# Patient Record
Sex: Female | Born: 1935
Health system: Southern US, Community
[De-identification: ages and names within clinical notes are randomized; demographics above are authoritative.]

## PROBLEM LIST (undated history)

## (undated) DIAGNOSIS — N189 Chronic kidney disease, unspecified: Secondary | ICD-10-CM

## (undated) DIAGNOSIS — N183 Chronic kidney disease, stage 3 (moderate): Secondary | ICD-10-CM

## (undated) DIAGNOSIS — K5790 Diverticulosis of intestine, part unspecified, without perforation or abscess without bleeding: Secondary | ICD-10-CM

## (undated) DIAGNOSIS — I4891 Unspecified atrial fibrillation: Secondary | ICD-10-CM

## (undated) DIAGNOSIS — K209 Esophagitis, unspecified without bleeding: Secondary | ICD-10-CM

## (undated) DIAGNOSIS — Z950 Presence of cardiac pacemaker: Secondary | ICD-10-CM

## (undated) DIAGNOSIS — N200 Calculus of kidney: Secondary | ICD-10-CM

## (undated) DIAGNOSIS — M797 Fibromyalgia: Secondary | ICD-10-CM

## (undated) DIAGNOSIS — K5792 Diverticulitis of intestine, part unspecified, without perforation or abscess without bleeding: Secondary | ICD-10-CM

## (undated) DIAGNOSIS — D126 Benign neoplasm of colon, unspecified: Secondary | ICD-10-CM

## (undated) DIAGNOSIS — I5043 Acute on chronic combined systolic (congestive) and diastolic (congestive) heart failure: Secondary | ICD-10-CM

## (undated) DIAGNOSIS — I428 Other cardiomyopathies: Secondary | ICD-10-CM

## (undated) DIAGNOSIS — F329 Major depressive disorder, single episode, unspecified: Secondary | ICD-10-CM

## (undated) DIAGNOSIS — M1990 Unspecified osteoarthritis, unspecified site: Secondary | ICD-10-CM

## (undated) DIAGNOSIS — Z9289 Personal history of other medical treatment: Secondary | ICD-10-CM

## (undated) DIAGNOSIS — I10 Essential (primary) hypertension: Secondary | ICD-10-CM

## (undated) DIAGNOSIS — Z7901 Long term (current) use of anticoagulants: Secondary | ICD-10-CM

## (undated) DIAGNOSIS — Z862 Personal history of diseases of the blood and blood-forming organs and certain disorders involving the immune mechanism: Secondary | ICD-10-CM

## (undated) DIAGNOSIS — K648 Other hemorrhoids: Secondary | ICD-10-CM

## (undated) DIAGNOSIS — K259 Gastric ulcer, unspecified as acute or chronic, without hemorrhage or perforation: Secondary | ICD-10-CM

## (undated) DIAGNOSIS — K2210 Ulcer of esophagus without bleeding: Secondary | ICD-10-CM

## (undated) DIAGNOSIS — K449 Diaphragmatic hernia without obstruction or gangrene: Secondary | ICD-10-CM

## (undated) DIAGNOSIS — I499 Cardiac arrhythmia, unspecified: Secondary | ICD-10-CM

## (undated) DIAGNOSIS — I82409 Acute embolism and thrombosis of unspecified deep veins of unspecified lower extremity: Secondary | ICD-10-CM

## (undated) DIAGNOSIS — M419 Scoliosis, unspecified: Secondary | ICD-10-CM

## (undated) DIAGNOSIS — F32A Depression, unspecified: Secondary | ICD-10-CM

## (undated) DIAGNOSIS — D62 Acute posthemorrhagic anemia: Secondary | ICD-10-CM

## (undated) DIAGNOSIS — H409 Unspecified glaucoma: Secondary | ICD-10-CM

## (undated) DIAGNOSIS — I700 Atherosclerosis of aorta: Secondary | ICD-10-CM

## (undated) HISTORY — DX: Cardiac arrhythmia, unspecified: I49.9

## (undated) HISTORY — DX: Other cardiomyopathies: I42.8

## (undated) HISTORY — PX: PACEMAKER INSERTION: SHX728

## (undated) HISTORY — DX: Gastric ulcer, unspecified as acute or chronic, without hemorrhage or perforation: K25.9

## (undated) HISTORY — DX: Diverticulosis of intestine, part unspecified, without perforation or abscess without bleeding: K57.90

## (undated) HISTORY — DX: Other hemorrhoids: K64.8

## (undated) HISTORY — DX: Atherosclerosis of aorta: I70.0

## (undated) HISTORY — DX: Benign neoplasm of colon, unspecified: D12.6

## (undated) HISTORY — PX: BACK SURGERY: SHX140

## (undated) HISTORY — DX: Personal history of diseases of the blood and blood-forming organs and certain disorders involving the immune mechanism: Z86.2

## (undated) HISTORY — PX: NECK SURGERY: SHX720

## (undated) HISTORY — DX: Acute embolism and thrombosis of unspecified deep veins of unspecified lower extremity: I82.409

## (undated) HISTORY — PX: HERNIA REPAIR: SHX51

## (undated) HISTORY — DX: Personal history of other medical treatment: Z92.89

## (undated) HISTORY — PX: APPENDECTOMY: SHX54

## (undated) HISTORY — DX: Acute on chronic combined systolic (congestive) and diastolic (congestive) heart failure: I50.43

## (undated) HISTORY — DX: Chronic kidney disease, unspecified: N18.9

## (undated) HISTORY — PX: TONSILLECTOMY: SUR1361

## (undated) HISTORY — PX: ABDOMINAL HYSTERECTOMY: SHX81

## (undated) HISTORY — PX: BREAST SURGERY: SHX581

## (undated) HISTORY — DX: Calculus of kidney: N20.0

## (undated) HISTORY — DX: Esophagitis, unspecified without bleeding: K20.90

## (undated) HISTORY — DX: Diaphragmatic hernia without obstruction or gangrene: K44.9

## (undated) HISTORY — PX: CHOLECYSTECTOMY: SHX55

---

## 1997-08-05 ENCOUNTER — Inpatient Hospital Stay (HOSPITAL_COMMUNITY): Admission: EM | Admit: 1997-08-05 | Discharge: 1997-08-07 | Payer: Self-pay | Admitting: Emergency Medicine

## 1997-08-14 ENCOUNTER — Emergency Department (HOSPITAL_COMMUNITY): Admission: EM | Admit: 1997-08-14 | Discharge: 1997-08-14 | Payer: Self-pay | Admitting: Emergency Medicine

## 1997-10-14 ENCOUNTER — Emergency Department (HOSPITAL_COMMUNITY): Admission: EM | Admit: 1997-10-14 | Discharge: 1997-10-14 | Payer: Self-pay | Admitting: *Deleted

## 1997-11-27 ENCOUNTER — Emergency Department (HOSPITAL_COMMUNITY): Admission: EM | Admit: 1997-11-27 | Discharge: 1997-11-27 | Payer: Self-pay | Admitting: Emergency Medicine

## 1997-11-28 ENCOUNTER — Emergency Department (HOSPITAL_COMMUNITY): Admission: EM | Admit: 1997-11-28 | Discharge: 1997-11-28 | Payer: Self-pay | Admitting: Emergency Medicine

## 1997-11-29 ENCOUNTER — Ambulatory Visit (HOSPITAL_COMMUNITY): Admission: RE | Admit: 1997-11-29 | Discharge: 1997-11-29 | Payer: Self-pay | Admitting: Emergency Medicine

## 1997-11-30 ENCOUNTER — Encounter: Admission: RE | Admit: 1997-11-30 | Discharge: 1998-02-28 | Payer: Self-pay | Admitting: Orthopedic Surgery

## 1997-12-16 ENCOUNTER — Emergency Department (HOSPITAL_COMMUNITY): Admission: EM | Admit: 1997-12-16 | Discharge: 1997-12-16 | Payer: Self-pay | Admitting: Emergency Medicine

## 1998-01-15 ENCOUNTER — Encounter: Admission: RE | Admit: 1998-01-15 | Discharge: 1998-04-15 | Payer: Self-pay | Admitting: Neurosurgery

## 1998-03-13 ENCOUNTER — Ambulatory Visit (HOSPITAL_COMMUNITY): Admission: RE | Admit: 1998-03-13 | Discharge: 1998-03-13 | Payer: Self-pay | Admitting: Neurosurgery

## 1998-03-15 ENCOUNTER — Ambulatory Visit (HOSPITAL_COMMUNITY): Admission: RE | Admit: 1998-03-15 | Discharge: 1998-03-15 | Payer: Self-pay | Admitting: Neurosurgery

## 1998-04-18 ENCOUNTER — Inpatient Hospital Stay (HOSPITAL_COMMUNITY): Admission: RE | Admit: 1998-04-18 | Discharge: 1998-04-18 | Payer: Self-pay | Admitting: Neurosurgery

## 1998-05-03 ENCOUNTER — Ambulatory Visit (HOSPITAL_COMMUNITY): Admission: RE | Admit: 1998-05-03 | Discharge: 1998-05-04 | Payer: Self-pay | Admitting: Neurosurgery

## 1998-05-09 ENCOUNTER — Ambulatory Visit (HOSPITAL_COMMUNITY): Admission: RE | Admit: 1998-05-09 | Discharge: 1998-05-09 | Payer: Self-pay | Admitting: Neurosurgery

## 1998-09-25 ENCOUNTER — Ambulatory Visit (HOSPITAL_COMMUNITY): Admission: RE | Admit: 1998-09-25 | Discharge: 1998-09-25 | Payer: Self-pay | Admitting: Neurosurgery

## 1998-10-09 ENCOUNTER — Ambulatory Visit (HOSPITAL_COMMUNITY): Admission: RE | Admit: 1998-10-09 | Discharge: 1998-10-09 | Payer: Self-pay | Admitting: Neurosurgery

## 1998-12-12 ENCOUNTER — Ambulatory Visit (HOSPITAL_BASED_OUTPATIENT_CLINIC_OR_DEPARTMENT_OTHER): Admission: RE | Admit: 1998-12-12 | Discharge: 1998-12-12 | Payer: Self-pay | Admitting: Orthopedic Surgery

## 1999-02-11 ENCOUNTER — Encounter: Admission: RE | Admit: 1999-02-11 | Discharge: 1999-05-12 | Payer: Self-pay | Admitting: Orthopedic Surgery

## 1999-03-21 ENCOUNTER — Encounter: Admission: RE | Admit: 1999-03-21 | Discharge: 1999-03-21 | Payer: Self-pay | Admitting: Neurosurgery

## 1999-10-03 ENCOUNTER — Encounter: Admission: RE | Admit: 1999-10-03 | Discharge: 1999-10-03 | Payer: Self-pay | Admitting: Orthopedic Surgery

## 1999-10-03 ENCOUNTER — Encounter: Payer: Self-pay | Admitting: Orthopedic Surgery

## 1999-10-04 ENCOUNTER — Ambulatory Visit (HOSPITAL_BASED_OUTPATIENT_CLINIC_OR_DEPARTMENT_OTHER): Admission: RE | Admit: 1999-10-04 | Discharge: 1999-10-04 | Payer: Self-pay | Admitting: Orthopedic Surgery

## 2000-02-24 ENCOUNTER — Encounter: Admission: RE | Admit: 2000-02-24 | Discharge: 2000-05-24 | Payer: Self-pay | Admitting: Anesthesiology

## 2000-06-02 ENCOUNTER — Encounter: Admission: RE | Admit: 2000-06-02 | Discharge: 2000-08-31 | Payer: Self-pay | Admitting: Anesthesiology

## 2000-09-01 ENCOUNTER — Encounter: Admission: RE | Admit: 2000-09-01 | Discharge: 2000-11-30 | Payer: Self-pay | Admitting: Anesthesiology

## 2000-12-23 ENCOUNTER — Encounter: Payer: Self-pay | Admitting: Orthopedic Surgery

## 2000-12-23 ENCOUNTER — Encounter: Admission: RE | Admit: 2000-12-23 | Discharge: 2000-12-23 | Payer: Self-pay | Admitting: Orthopedic Surgery

## 2000-12-25 ENCOUNTER — Ambulatory Visit (HOSPITAL_BASED_OUTPATIENT_CLINIC_OR_DEPARTMENT_OTHER): Admission: RE | Admit: 2000-12-25 | Discharge: 2000-12-25 | Payer: Self-pay | Admitting: Orthopedic Surgery

## 2001-12-28 ENCOUNTER — Ambulatory Visit (HOSPITAL_COMMUNITY): Admission: RE | Admit: 2001-12-28 | Discharge: 2001-12-28 | Payer: Self-pay | Admitting: Internal Medicine

## 2001-12-28 ENCOUNTER — Encounter: Payer: Self-pay | Admitting: Internal Medicine

## 2005-05-05 DIAGNOSIS — Z9289 Personal history of other medical treatment: Secondary | ICD-10-CM

## 2005-05-05 HISTORY — DX: Personal history of other medical treatment: Z92.89

## 2005-12-03 ENCOUNTER — Inpatient Hospital Stay (HOSPITAL_COMMUNITY): Admission: AD | Admit: 2005-12-03 | Discharge: 2005-12-12 | Payer: Self-pay | Admitting: Cardiology

## 2005-12-03 ENCOUNTER — Encounter: Payer: Self-pay | Admitting: Emergency Medicine

## 2005-12-04 ENCOUNTER — Encounter (INDEPENDENT_AMBULATORY_CARE_PROVIDER_SITE_OTHER): Payer: Self-pay | Admitting: Cardiology

## 2005-12-08 HISTORY — PX: CARDIAC CATHETERIZATION: SHX172

## 2005-12-16 ENCOUNTER — Inpatient Hospital Stay (HOSPITAL_COMMUNITY): Admission: AD | Admit: 2005-12-16 | Discharge: 2005-12-18 | Payer: Self-pay | Admitting: Cardiovascular Disease

## 2005-12-17 ENCOUNTER — Encounter (INDEPENDENT_AMBULATORY_CARE_PROVIDER_SITE_OTHER): Payer: Self-pay | Admitting: *Deleted

## 2005-12-31 ENCOUNTER — Ambulatory Visit: Payer: Self-pay | Admitting: Internal Medicine

## 2005-12-31 ENCOUNTER — Inpatient Hospital Stay (HOSPITAL_COMMUNITY): Admission: EM | Admit: 2005-12-31 | Discharge: 2006-01-03 | Payer: Self-pay | Admitting: *Deleted

## 2006-01-21 ENCOUNTER — Ambulatory Visit: Payer: Self-pay

## 2006-03-06 ENCOUNTER — Ambulatory Visit: Payer: Self-pay

## 2007-12-08 ENCOUNTER — Emergency Department (HOSPITAL_COMMUNITY): Admission: EM | Admit: 2007-12-08 | Discharge: 2007-12-08 | Payer: Self-pay | Admitting: Emergency Medicine

## 2008-01-02 ENCOUNTER — Inpatient Hospital Stay (HOSPITAL_COMMUNITY): Admission: EM | Admit: 2008-01-02 | Discharge: 2008-01-07 | Payer: Self-pay | Admitting: Emergency Medicine

## 2008-01-03 ENCOUNTER — Ambulatory Visit: Payer: Self-pay | Admitting: Physical Medicine & Rehabilitation

## 2008-02-11 ENCOUNTER — Ambulatory Visit (HOSPITAL_COMMUNITY): Admission: RE | Admit: 2008-02-11 | Discharge: 2008-02-11 | Payer: Self-pay | Admitting: Neurosurgery

## 2009-02-05 ENCOUNTER — Observation Stay (HOSPITAL_COMMUNITY): Admission: EM | Admit: 2009-02-05 | Discharge: 2009-02-06 | Payer: Self-pay | Admitting: Emergency Medicine

## 2009-02-13 HISTORY — PX: OTHER SURGICAL HISTORY: SHX169

## 2010-05-26 ENCOUNTER — Encounter: Payer: Self-pay | Admitting: Neurosurgery

## 2010-05-30 HISTORY — PX: DOPPLER ECHOCARDIOGRAPHY: SHX263

## 2010-08-08 LAB — LIPID PANEL
Triglycerides: 81 mg/dL (ref <150)
VLDL: 16 mg/dL (ref 0–40)

## 2010-08-08 LAB — PROTIME-INR
INR: 3.6 — ABNORMAL HIGH (ref 0.00–1.49)
Prothrombin Time: 21.5 seconds — ABNORMAL HIGH (ref 11.6–15.2)
Prothrombin Time: 35.9 seconds — ABNORMAL HIGH (ref 11.6–15.2)

## 2010-08-08 LAB — BASIC METABOLIC PANEL
BUN: 15 mg/dL (ref 6–23)
CO2: 27 mEq/L (ref 19–32)
CO2: 27 mEq/L (ref 19–32)
Calcium: 8.8 mg/dL (ref 8.4–10.5)
Chloride: 98 mEq/L (ref 96–112)
Creatinine, Ser: 1.49 mg/dL — ABNORMAL HIGH (ref 0.4–1.2)
GFR calc Af Amer: 41 mL/min — ABNORMAL LOW (ref >60)
GFR calc Af Amer: 59 mL/min — ABNORMAL LOW (ref >60)
GFR calc non Af Amer: 43 mL/min — ABNORMAL LOW (ref >60)
Glucose, Bld: 86 mg/dL (ref 70–99)
Potassium: 2.8 mEq/L — ABNORMAL LOW (ref 3.5–5.1)
Potassium: 4.3 mEq/L (ref 3.5–5.1)
Sodium: 135 mEq/L (ref 135–145)

## 2010-08-08 LAB — IRON AND TIBC
Iron: 152 ug/dL — ABNORMAL HIGH (ref 42–135)
Saturation Ratios: 54 % (ref 20–55)
UIBC: 130 ug/dL

## 2010-08-08 LAB — CBC
HCT: 33.2 % — ABNORMAL LOW (ref 36.0–46.0)
HCT: 33.2 % — ABNORMAL LOW (ref 36.0–46.0)
Hemoglobin: 11.5 g/dL — ABNORMAL LOW (ref 12.0–15.0)
MCHC: 34.6 g/dL (ref 30.0–36.0)
MCV: 110.9 fL — ABNORMAL HIGH (ref 78.0–100.0)
MCV: 111.2 fL — ABNORMAL HIGH (ref 78.0–100.0)
Platelets: 125 10*3/uL — ABNORMAL LOW (ref 150–400)
RBC: 2.99 MIL/uL — ABNORMAL LOW (ref 3.87–5.11)
RDW: 14.5 % (ref 11.5–15.5)
WBC: 4.9 10*3/uL (ref 4.0–10.5)

## 2010-08-08 LAB — POCT CARDIAC MARKERS: Myoglobin, poc: 34.4 ng/mL (ref 12–200)

## 2010-08-08 LAB — VITAMIN D 1,25 DIHYDROXY: Vitamin D 1, 25 (OH)2 Total: 40 pg/mL (ref 18–72)

## 2010-08-08 LAB — DIFFERENTIAL
Basophils Relative: 0 % (ref 0–1)
Eosinophils Absolute: 0.1 10*3/uL (ref 0.0–0.7)
Eosinophils Relative: 2 % (ref 0–5)
Lymphocytes Relative: 20 % (ref 12–46)
Neutrophils Relative %: 70 % (ref 43–77)
Smear Review: ADEQUATE

## 2010-08-08 LAB — HEMOGLOBIN A1C: Hgb A1c MFr Bld: 5.3 % (ref 4.6–6.1)

## 2010-08-25 ENCOUNTER — Inpatient Hospital Stay (HOSPITAL_COMMUNITY)
Admission: EM | Admit: 2010-08-25 | Discharge: 2010-08-29 | DRG: 690 | Disposition: A | Discharge status: Home / Self Care | Payer: Medicare Other | Attending: Internal Medicine | Admitting: Internal Medicine

## 2010-08-25 ENCOUNTER — Emergency Department (HOSPITAL_COMMUNITY): Payer: Medicare Other

## 2010-08-25 DIAGNOSIS — Z7901 Long term (current) use of anticoagulants: Secondary | ICD-10-CM

## 2010-08-25 DIAGNOSIS — Z880 Allergy status to penicillin: Secondary | ICD-10-CM

## 2010-08-25 DIAGNOSIS — I509 Heart failure, unspecified: Secondary | ICD-10-CM | POA: Diagnosis present

## 2010-08-25 DIAGNOSIS — M549 Dorsalgia, unspecified: Secondary | ICD-10-CM | POA: Diagnosis present

## 2010-08-25 DIAGNOSIS — N390 Urinary tract infection, site not specified: Principal | ICD-10-CM | POA: Diagnosis present

## 2010-08-25 DIAGNOSIS — G8929 Other chronic pain: Secondary | ICD-10-CM | POA: Diagnosis present

## 2010-08-25 DIAGNOSIS — Z882 Allergy status to sulfonamides status: Secondary | ICD-10-CM

## 2010-08-25 DIAGNOSIS — I4891 Unspecified atrial fibrillation: Secondary | ICD-10-CM | POA: Diagnosis present

## 2010-08-25 DIAGNOSIS — Z888 Allergy status to other drugs, medicaments and biological substances status: Secondary | ICD-10-CM

## 2010-08-25 DIAGNOSIS — I5022 Chronic systolic (congestive) heart failure: Secondary | ICD-10-CM | POA: Diagnosis present

## 2010-08-25 DIAGNOSIS — N179 Acute kidney failure, unspecified: Secondary | ICD-10-CM | POA: Diagnosis present

## 2010-08-25 LAB — URINALYSIS, ROUTINE W REFLEX MICROSCOPIC
Bilirubin Urine: NEGATIVE
Ketones, ur: NEGATIVE mg/dL
Nitrite: NEGATIVE
Protein, ur: NEGATIVE mg/dL

## 2010-08-25 LAB — CBC
Hemoglobin: 11.6 g/dL — ABNORMAL LOW (ref 12.0–15.0)
MCHC: 35.3 g/dL (ref 30.0–36.0)
Platelets: 245 10*3/uL (ref 150–400)
RDW: 12.2 % (ref 11.5–15.5)

## 2010-08-25 LAB — BASIC METABOLIC PANEL
BUN: 28 mg/dL — ABNORMAL HIGH (ref 6–23)
Creatinine, Ser: 1.93 mg/dL — ABNORMAL HIGH (ref 0.4–1.2)
GFR calc non Af Amer: 25 mL/min — ABNORMAL LOW (ref >60)

## 2010-08-25 LAB — DIFFERENTIAL
Basophils Absolute: 0 10*3/uL (ref 0.0–0.1)
Basophils Relative: 0 % (ref 0–1)
Eosinophils Absolute: 0.2 10*3/uL (ref 0.0–0.7)
Eosinophils Relative: 3 % (ref 0–5)
Monocytes Absolute: 0.7 10*3/uL (ref 0.1–1.0)
Neutro Abs: 4.5 10*3/uL (ref 1.7–7.7)

## 2010-08-25 LAB — PROTIME-INR
INR: 2.25 — ABNORMAL HIGH (ref 0.00–1.49)
Prothrombin Time: 25 seconds — ABNORMAL HIGH (ref 11.6–15.2)

## 2010-08-26 ENCOUNTER — Inpatient Hospital Stay (HOSPITAL_COMMUNITY): Payer: Medicare Other

## 2010-08-26 LAB — COMPREHENSIVE METABOLIC PANEL
ALT: 17 U/L (ref 0–35)
AST: 26 U/L (ref 0–37)
Albumin: 3.6 g/dL (ref 3.5–5.2)
Alkaline Phosphatase: 41 U/L (ref 39–117)
CO2: 24 mEq/L (ref 19–32)
Chloride: 106 mEq/L (ref 96–112)
Creatinine, Ser: 1.46 mg/dL — ABNORMAL HIGH (ref 0.4–1.2)
GFR calc Af Amer: 42 mL/min — ABNORMAL LOW (ref >60)
GFR calc non Af Amer: 34 mL/min — ABNORMAL LOW (ref >60)
Potassium: 3.7 mEq/L (ref 3.5–5.1)
Total Bilirubin: 0.5 mg/dL (ref 0.3–1.2)

## 2010-08-26 LAB — DIFFERENTIAL
Eosinophils Absolute: 0.2 10*3/uL (ref 0.0–0.7)
Eosinophils Relative: 3 % (ref 0–5)
Lymphocytes Relative: 21 % (ref 12–46)
Lymphs Abs: 1.2 10*3/uL (ref 0.7–4.0)
Monocytes Relative: 9 % (ref 3–12)
Neutrophils Relative %: 68 % (ref 43–77)

## 2010-08-26 LAB — CBC
HCT: 32.3 % — ABNORMAL LOW (ref 36.0–46.0)
MCH: 35.7 pg — ABNORMAL HIGH (ref 26.0–34.0)
MCV: 104.9 fL — ABNORMAL HIGH (ref 78.0–100.0)
Platelets: 226 10*3/uL (ref 150–400)
RBC: 3.08 MIL/uL — ABNORMAL LOW (ref 3.87–5.11)

## 2010-08-26 LAB — BRAIN NATRIURETIC PEPTIDE: Pro B Natriuretic peptide (BNP): 240 pg/mL — ABNORMAL HIGH (ref 0.0–100.0)

## 2010-08-26 LAB — APTT: aPTT: 43 seconds — ABNORMAL HIGH (ref 24–37)

## 2010-08-27 ENCOUNTER — Inpatient Hospital Stay (HOSPITAL_COMMUNITY): Payer: Medicare Other

## 2010-08-28 LAB — URINE CULTURE
Colony Count: >=100000
Culture  Setup Time: 201204222358

## 2010-08-28 LAB — PROTIME-INR: Prothrombin Time: 16.7 seconds — ABNORMAL HIGH (ref 11.6–15.2)

## 2010-08-29 LAB — BASIC METABOLIC PANEL
BUN: 11 mg/dL (ref 6–23)
CO2: 28 mEq/L (ref 19–32)
Glucose, Bld: 101 mg/dL — ABNORMAL HIGH (ref 70–99)
Potassium: 3.9 mEq/L (ref 3.5–5.1)
Sodium: 139 mEq/L (ref 135–145)

## 2010-08-29 LAB — CBC
Hemoglobin: 10.1 g/dL — ABNORMAL LOW (ref 12.0–15.0)
MCH: 35.9 pg — ABNORMAL HIGH (ref 26.0–34.0)
MCHC: 34.2 g/dL (ref 30.0–36.0)
MCV: 105 fL — ABNORMAL HIGH (ref 78.0–100.0)
Platelets: 215 10*3/uL (ref 150–400)
RBC: 2.81 MIL/uL — ABNORMAL LOW (ref 3.87–5.11)

## 2010-08-29 LAB — PROTIME-INR: Prothrombin Time: 17.2 seconds — ABNORMAL HIGH (ref 11.6–15.2)

## 2010-08-31 NOTE — H&P (Signed)
NAME:  Kimberly [name], Kimberly [name]                 ACCOUNT NO.:  [id number]  MEDICAL RECORD NO.:  [id number]           PATIENT TYPE:  E  LOCATION:  [facility]                         FACILITY:  [facility]  PHYSICIAN:  Farris Has, MDDATE OF BIRTH:  May 19, 1926  DATE OF ADMISSION:  08/25/2010 DATE OF DISCHARGE:                             HISTORY & PHYSICAL   PRIMARY CARE PROVIDER:  None locally.  She goes to physician in [place].  CARDIOLOGIST:  Tery Sanfilippo, MD  CHIEF COMPLAINT:  Back pain and a spot swelling on her ankles.  The patient is an 77 year old female with history of CHF with EF of 15- 20% who reports that although her weight has been somewhat down lately, she occasionally has had some swelling of her ankles and for the past 2- 3 days, she has been having back pain, she does have history of fibromyalgia, but she felt that her Vicodin was not helping her back pain, so she drove herself to emergency department.  Of note, she did not endorse any fevers or chills.  No chest pains.  She does always have some shortness of breath and was nothing above usual.  She denied orthopnea, PND.  Otherwise, review of systems is negative.  In the emergency department, the patient was found to have urinary tract infection.  She was initially given Cipro given that she has a history of penicillin allergies, but developed a rash that she was attempted to give actually Rocephin.  Still did develop rash and itching, which point the pharmacy's recommendations that going to continue with Fosfomycin.  [facility] called for an admission given her creatinine has been somewhat elevated.  PAST MEDICAL HISTORY:  Significant for: 1. Heart failure with EF of 15-20%, status post biventricular     implantable cardioverter-defibrillator, Medtronic Concerta. 2. History of chronic back pain. 3. Nonischemic cardiomyopathy. 4. Atypical chest pain. 5. Fibromyalgia. 6. History of left bundle branch-block, pacer  dependent. 7. History of moderate mitral regurgitation. 8. Chronic atrial fibrillation, on Coumadin. 9. Hypokalemia. 10.Microcytic anemia. 11.Dysmotility.  SOCIAL HISTORY:  The patient is a former smoker, but quit 5 years ago. Does not drink except occasional wine.  Does not abuse drugs.  Lives at home.  Her husband has been recently admitted to nursing home facility.  FAMILY HISTORY:  Noncontributory.  Her mother died at the very early age from a motor vehicle accident.  ALLERGIES: 1. PENICILLIN. 2. SULFA. 3. CIPRO causes a rash. 4. ROCEPHIN causes a rash.  MEDICATIONS: 1. Paroxetine 40 mg daily. 2. Coumadin she takes 2.5 mg every other day and 4 mg on other days. 3. Carvedilol 6.25 twice a day. 4. Vicodin as needed to up to four times a day. 5. Lasix 40 mg in the morning. 6. Advil PM at night. 7. Temazepam 30 mg at night.  PHYSICAL EXAMINATION:  VITAL SIGNS:  Temperature 97.8, blood pressure 155/60, pulse 79, respiration 18, satting 100% on room air. GENERAL:  The patient appears to be in no acute distress.HEENT:  Head nontraumatic.  Somewhat dryish mucous membranes and decreased skin turgor, I did not appreciate any peripheral edema whatsoever. LUNGS:  No crackles could be appreciated.  No wheezes. HEART:  Regular rate.  No murmurs could be appreciated. ABDOMEN:  Soft, nontender, slightly obese. LOWER EXTREMITIES:  No clubbing, cyanosis, or edema.  Of note, she does have multiple bruising on her pretibial area.  She also has a localized swelling on her left ankle, which she describes been there for years after she has suffered a fracture.  She has occasional needle insertion into the site and drainage perhaps a cyst.  LABORATORY DATA:  White blood cell count 6.5, hemoglobin 11.6.  Sodium 138, potassium 2.8, creatinine 1.9, her baseline creatinine is 1.2.  UA showing too numerous to count white blood cells.  EKG appears to pace. INR 2.25.  ASSESSMENT AND PLAN:  This  is an 77 year old female with urinary tract infection and worsening renal insufficiency. 1. Urinary tract infection.  The patient has had allergic reactions to     multiple medications.  At this point after discussion with     pharmacy, we will give Fosfomycin one dose. 2. Acute renal failure.  It is possible the patient has no acute     episode of fluids and she continues to take her Lasix given her     significant history of heart failure.  We will hold Lasix for one     day, give extremely gentle fluids at [facility].  Monitor her     orthostatics, urine output and weight.  I would recommend     restarting her Lasix the day after tomorrow and watching her     carefully for fluid overload. 3. History of atrial fibrillation.  She is currently paced.  Continue     carvedilol and Coumadin. 4. Prophylaxis.  Protonix and Coumadin.  CODE STATUS:  The patient wished to be do not resuscitate, do not intubate, which I discussed with her.     Farris Has, MD     AVD/MEDQ  D:  08/25/2010  T:  08/25/2010  Job:  [id number]  cc:   Tery Sanfilippo, MD  Electronically Signed by Toy Baker MD on 08/31/2010 09:44:59 PM

## 2010-09-17 NOTE — H&P (Signed)
NAME:  Kimberly [name]                 ACCOUNT NO.:  [id number]   MEDICAL RECORD NO.:  [id number]          PATIENT TYPE:  INP   LOCATION:  [id number]                         FACILITY:  [facility]   PHYSICIAN:  Jana Hakim, M.D. DATE OF BIRTH:  02-20-1927   DATE OF ADMISSION:  01/02/2008  DATE OF DISCHARGE:                              HISTORY & PHYSICAL   PRIMARY CARE PHYSICIAN:  Dr. Seward Speck in [place], [place].   CHIEF COMPLAINT:  Severe back pain.   HISTORY OF PRESENT ILLNESS:  This is a 77 year old female who was  brought by [facility] emergency medical services to [facility] secondary to complaints of severe back pain.  The patient  reports having back pain for 2 years and is currently undergoing an  evaluation by orthopedics.  She reports having increased back pain over  the past 2 weeks secondary to increased exertion and reports being  unable to walk secondary to her discomfort.  She reports that she has  had to use a walker to walk.  She also reports having increased falls  secondary to her weakness and discomfort.   PAST MEDICAL HISTORY:  Significant for chronic back pain, fibromyalgia,  osteoarthritis, coronary artery disease, nonischemic cardiomyopathy,  status post biventricular dual-chamber ICD pacer-defibrillator  placement.  The patient has an ejection fraction of 15% and class III  heart failure.   PAST SURGICAL HISTORY:  The ICD pacer-defibrillator placement, anterior  cervical diskectomy, and the patient reports having multiple other  surgeries in the past.   MEDICATIONS:  Include Coumadin, Paxil, Darvocet, lisinopril, temazepam,  Premarin, Coreg, Advil and Lasix.   ALLERGIES:  TO IODINE, PENICILLIN, SULFA AND THE PATIENT ALSO REPORTS A  MEDICATION USED PREVIOUSLY FOR A BACK INJECTION, SHE STATES IS  CHYMOPAPAIN.   SOCIAL HISTORY:  The patient is married.  Her husband has been recently  ill and hospitalized.  She is a nonsmoker.  She does  report drinking  alcohol, 1 mixed drink daily.   FAMILY HISTORY:  Noncontributory.   PHYSICAL EXAMINATION:  This is a 77 year old elderly female in no  visible discomfort or acute distress at this time.  VITAL SIGNS:  Temperature 97.1.  Blood pressure 151/71.  Heart rate 71.  Respirations 24 and O2 saturation is 98%.  HEENT:  Normocephalic, atraumatic.  Pupils are equally round, reactive  to light.  Extraocular movements are intact.  Funduscopic benign.  Oropharynx is clear.  NECK:  Supple.  Full range of motion.  No thyromegaly, adenopathy,  jugular venous distention.  CARDIOVASCULAR:  Regular paced rate.  No murmurs, gallops or rubs.  LUNGS:  Clear to auscultation bilaterally.  ABDOMEN:  Positive bowel sounds, soft, nontender, nondistended.  EXTREMITIES:  Without cyanosis, clubbing or edema.  BACK:  Examination.  No spinous process tenderness.  No palpable  muscular spasms.  Limited range of motion secondary to discomfort.   LABORATORY STUDIES:  White blood cell count 5.6, hemoglobin 11.2,  hematocrit 32.4, MCV 104.5, platelets 259, neutrophils 65%, lymphocytes  24%.  Sodium 136, potassium 3.2, chloride 99, bicarb 31, BUN 13,  creatinine 1.2 and  glucose 76.   Lumbar spine x-ray reveals stable lumbar spondylosis and scoliosis.  No  definitive acute osseous findings.   Please add cardiac enzymes the laboratory section cardiac enzymes  myoglobin 43.6, CK MB 2.8 and troponin less than 0.05.  EKG revealed a  paced rhythm, left bundle branch block present.   ASSESSMENT:  A 77 year old female being admitted with:  1. Intractable back pain.  2. Fibromyalgia.  3. Mild hypokalemia.  4. Macrocytic anemia.  5. Cardiomyopathy history.  6. Coagulopathy secondary to Coumadin therapy.   PLAN:  The patient will be admitted for pain control.  The patient also  reports having sensitivities to pain medications.  These will be  administered judiciously.  The patient will continue on her  regular  medications.  A PT and INR will be monitored daily.  Her potassium will  also be repleted.  GI prophylaxis has also been ordered.  Physical  therapy and occupational therapy have been requested and also an  evaluation by rehabilitation therapy for possible placement.      Jana Hakim, M.D.  Electronically Signed     HJ/MEDQ  D:  01/03/2008  T:  01/03/2008  Job:  [id number]

## 2010-09-17 NOTE — Discharge Summary (Signed)
NAME:  Kimberly [name], Kimberly [name]                 ACCOUNT NO.:  [id number]   MEDICAL RECORD NO.:  [id number]          PATIENT TYPE:  INP   LOCATION:  [id number]                         [facility]ACILITY:  [facility]   PHYSICIAN:  Hind I Elsaid, MD      DATE OF BIRTH:  22-Aug-1926   DATE OF ADMISSION:  01/02/2008  DATE OF DISCHARGE:  01/07/2008                               DISCHARGE SUMMARY   DISCHARGE DIAGNOSES:  1. Severe lower back pain.  2. Cardiomyopathy status post ICD.  3. Fibromyalgia.  4. History of osteoarthritis.  5. History of coronary artery disease with nonischemic cardiomyopathy,      status post biventricular ICD.  6. History of congestive heart failure which compensated during this      admission.  7. Escherichia coli urinary tract infection.   DISCHARGE MEDICATIONS:  1. Tramadol 50 mg every 6 hours as needed.  2. Skelaxin 800 mg twice daily.  3. Cipro 250 mg twice daily.  4. Prednisone tapering dose.   PROCEDURES:  1. Chest x-ray, several examination, no active cardiopulmonary      process.  2. Lumbar spine x-ray:  Lumbar spondylosis and No definitive acute      fracture_finding, however, severe end plate compression deformity      at T12 is difficult to exclude given the patient's scoliosis.  3. CT lumbar spine, T12 superior plate compression deformity.      Scoliosis deformity with advanced multiple degenerative change.   HISTORY AND HOSPITAL COURSE:  This is a 77 year old Caucasian female  admitted to the hospital with severe back pain.  The patient admitted  her back pain for two years but is currently undergoing evaluation by  orthopedics.  She had reported having increased back pain over the past  two weeks secondary to increased exertion and being unable to walk  secondary to her discomfort.  He severe back pain was felt to be  secondary to degenerative joint disease.  During her hospital stay, the  patient received PT and OT and they recommended home health versus SNF  but the  patient refused both interventions.  CT of the lumbar spine did  not show any active fracture or acute cause of her back pain and we felt  the back pain was secondary to degenerative joint disease.  Orthopedics  were consulted and as the patient was on chronic Coumadin recommendation  was for patient to contact her primary care physician within one week  for possibility of holding Coumadin to start lumbar epidural steroid  injections.  The patient also started on muscle relaxant, mainly  Skelaxin and pain medications.  Also, the patient is started on steroid  tapered dose.  During her hospital stay, the pain resolved and the  patient was able to mobilize.  The patient has an appointment to see her  cardiologist on Wednesday and the patient will contact Dr. Elisabeth Cara from  [facility] for stopping her Coumadin before  contacting her orthopedic physician.   The patient needs to follow up with her cardiologist and follow up with  Dr. Arcelia Jew for possible  epidural steroid injection.   For urinary tract infection, the patient was started on Cipro according  to the sensitivity and patient did well.   Coronary artery disease, status post ICD.  This remained stable and the  patient has no evidence of active CHF.   The patient is to resume her home medications.  We feel that the patient  is medically stable for discharge home.      Hind Franco Collet, MD  Electronically Signed     HIE/MEDQ  D:  02/02/2008  T:  02/02/2008  Job:  [id number]
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
## 2010-09-20 NOTE — Procedures (Signed)
[facility]  Patient:    Kimberly [name], Kimberly [name]                        MRN: [id number] Proc. Date: 03/03/00 Adm. Date:  [id number] Attending:  Nicholaus Bloom CC:         Alta Corning, M.D.  Cleda Mccreedy, M.D.   Procedure Report  PROCEDURE:  Lumbar sympathetic block.  DIAGNOSIS:  Complex regional pain syndrome of the left lower extremity  ANESTHESIOLOGIST:  Nicholaus Bloom, M.D.  INTERVAL HISTORY:  The patient noted marked attenuation in her pain an less intense purplish discoloration of her left foot after her block last week and initiation of the methadone at very low dose.  She seems to be tolerating it well and noticing it is helping her shoulder discomfort.  She rates her pain at 7/10 today.  PHYSICAL EXAMINATION:  VITAL SIGNS:  Blood pressure 148/82, heart rate 96, respiratory rate 18, O2 saturation 97%.  BACK:  She exhibits good healing of her back from previous injection site.  EXTREMITIES:  She continues to have coolish temperature over her left foot. There is still some purplish discoloration, but it is much less intense than at her last visit.  She is tender over the dorsum of her foot but much less so than at her last visit.  PROCEDURE:  After informed consent was obtained, the patient was placed in the prone position and IV established in her right upper extremity.  Monitors were placed.  She was sedated very lightly with fentanyl and Versed.  Using fluoroscopic guidance, I identified lumbar vertebrae L1 to L5.  She, of note, has very marked scoliosis through the lumbar spine.  At 7 cm lateral to the spinous process of L2, a mark was made on the skin.  The skin was was prepped with Betadine x 3 and draped.  I anesthetized the skin and subcutaneous structures with a total of 4 cc of 1% Lidocaine using initially a short 25-gauge needle followed by a 25-gauge spinal needle.  The 22-gauge Chiva needle was introduced in the anterolateral  aspect of L3 by AP, lateral, oblique projections.  Aspiration was negative for blood and CSF.  I injected 20 ml of 1% chloroprocaine in 1 ml increments with negative aspirates between each increment.  This was followed by 20 ml of 0.25% bupivacaine in 1 ml increments with negative aspirates between each increment.  The needle was flushed with 3 cc of air and removed intact.  Fifteen minutes later, the patient had vasal dilatation of the dorsum of the foot and some warming, although it was not hot to touch over the foot.  She had much less pain.  POSTPROCEDURE CONDITION:  Stable with evidence of a sympathetic block to the left lower extremity and decreased pain.  DISCHARGE INSTRUCTIONS: 1. Resume previous diet. 2. Limitation of activities per instruction sheet. 3. Continue on methadone 5 mg 1/2 tablet q.12h. as well as her other    medications. 4. Follow up with me in one week. DD:  03/03/00 TD:  03/03/00 Job: [id number]

## 2010-09-20 NOTE — H&P (Signed)
[facility]  Patient:    KAINAT, KNUPPEL                        MRN: [id number] Adm. Date:  [id number] Attending:  Nicholaus Bloom CC:         Cleda Mccreedy, M.D.  Alta Corning, M.D.   History and Physical  FOLLOWUP EVALUATION  HISTORY OF PRESENT ILLNESS:  Catherin comes in for follow-up evaluation of her complex regional pain syndrome of the left foot.  Since her last evaluation, the patient has had ongoing pain, and she does not feel as though the OxyContin has been of much benefit at all.  She rates her pain at 10/10.  She felt better on the Percocet.  She continues to have color changes, coldness of the foot, and tenderness.  It is more painful with weightbearing.  CURRENT MEDICATIONS:  Atenolol, Premarin, Coumadin, Paxil, and temazepam.  PHYSICAL EXAMINATION:  VITAL SIGNS:  Blood pressure 127/54, heart rate 76, respiratory rate 20, and O2 saturation 98%.  Pain level is 10/10.  EXTREMITIES:  She has reddish-blue discoloration of her left foot which is cold with tenderness to touch.  Her dorsalis pedis pulse is 1+ on that side.  IMPRESSION: 1. Complex regional pain syndrome of the left foot. 2. Other medical problems as per Dr. Eula Fried.  DISPOSITION: 1. The patient will need to go off of her Coumadin and get her PT checked.  We    plan to go ahead and go through another series of lumbar sympathetic    blocks.  She felt as though she had a very good response to those and she    would like to go through them again.  She is aware of the potential risk of    these. 2. Trial of Duragesic 25 mcg to be applied every 3 days, #5, with no refill.    We will give her a coupon. 3. Percocet 5/325 1 p.o. q.6h. p.r.n. breakthrough pain, #50 with no refill. 4. Followup with me in one to two weeks to begin her lumbar sympathetic    block series again. DD:  08/21/00 TD:  08/23/00 Job: [id number]

## 2010-09-20 NOTE — Op Note (Signed)
[place]  Patient:    Kimberly [name], Kimberly [name] Visit Number: [id number] MRN: [id number]          Service Type: DSU Location: [facility] Attending Physician:  Lowella Petties Proc. Date: 12/25/00 Adm. Date:  12/25/2000 Disc. Date: 12/25/2000                             Operative Report  PREOPERATIVE DIAGNOSIS:  Retained hardware left foot.  POSTOPERATIVE DIAGNOSIS:  Retained hardware left foot.  PROCEDURE:  Removal of hardware of left foot.  SURGEON:  Alta Corning, M.D.  ASSISTANT:  Alvina Filbert. Natividad Brood.  BRIEF HISTORY:  This is a 77 year old female with a long history of having had a bunionette procedure with a small mini-fragment screw placed in the 5th metatarsal.  The patient was brought to operating room and after appropriate anesthesia had been obtained with a general anesthetic the patient was placed supine on the operating room table.  The patient was prepped and draped in the usual sterile fashion.  Following this an incision was made over her previous incision and subcutaneous tissue in line with the screw.  The screw was identified and removed.  The following this the wound was copiously irrigated and suctioned dry.  The wound was then closed with 4-0 nylon interrupted suture.  Sterile compressive dressing was applied.  She was taken to the recovery room where she was noted to be in satisfactory condition.  ESTIMATED BLOOD LOSS:  None. Attending Physician:  Lowella Petties DD:  12/25/00 TD:  12/27/00 Job: [id number]

## 2010-09-20 NOTE — Discharge Summary (Signed)
NAME:  Kimberly [name], Kimberly [name]                 ACCOUNT NO.:  [id number]   MEDICAL RECORD NO.:  [id number]          PATIENT TYPE:  INP   LOCATION:  2016                         FACILITY:  [facility]   PHYSICIAN:  Erlene Quan, P.A.   DATE OF BIRTH:  Sep 11, 1926   DATE OF ADMISSION:  12/03/2005  DATE OF DISCHARGE:  12/12/2005                                 DISCHARGE SUMMARY   DISCHARGE DIAGNOSES:  1. Congestive heart failure, improved at discharge.  2. Presumed nonischemic cardiomyopathy with only minor to moderate      coronary artery disease with catheterization this admission.  3. Left ventricular dysfunction with an ejection fraction of 15-20% at      catheterization but 40-45% by echocardiogram.  4. Chronic atrial fibrillation with permanent pacemaker implant in the      past.  5. Chronic Coumadin therapy.   HOSPITAL COURSE:  The patient is a 77 year old female followed who has had  atrial fibrillation and pacemaker in the past.  She is on chronic Coumadin  therapy.  She is referred to Dr. Tami Ribas for evaluation of increasing  shortness of breath.  We had an appointment for December 31, 2005 but she  started having chest pain and was admitted through the emergency room December 03, 2005.  Her symptoms were initially consistent with unstable angina.  Her  INR was 3.3.  She was admitted to telemetry.  CK-MB and troponins were  obtained.  These were negative.  Her BMP was 1531.  She was treated with IV  diuretics.  Her Coumadin was held and her INR followed.  Echocardiogram done  December 04, 2005 showed EF of 40-45%.  She did have a left bundle.  Her  prothrombin was low enough for the patient t be catheterized on December 08, 2005.  She was premedicated for a history of contrast allergy prior to her  catheterization.  Catheterization revealed severely dilated left ventricular  with an EF of 15-20%.  Left main was normal.  Circumflex had 40% narrowing  at the OM and a 30 to 40% large OM.  Her LAD had a  30% narrowing in the RCA  without significant disease.  Her beta blocker was changed to Coreg.  She  was started on heparin and Coumadin post catheterization.  We continued to  push for some diaphoresis as her BMP after catheterization went up to 2129.  We feel she is stable for discharge December 12, 2005.  She will go home on  Lovenox to Coumadin crossover.  Her discharge weight is 144.  Her admission  weight was 151.   DISCHARGE MEDICATIONS:  1. Coumadin as taken prior to admission.  I believe she takes 5 mg a day      with 2.5 two days a week.  2. Lovenox 65 mg twice a day till INR is therapeutic.  3. Simvastatin 10 mg daily.  4. Lisinopril 10 mg nightly.  5. Coreg CR 20 mg daily.  6. Lasix 40 mg daily.  7. Potassium 20 mEq daily.   LABORATORY DATA:  At discharge, white count 6.1,  hemoglobin 12.4, hematocrit  36.3, platelets 296.  Sodium 135, potassium 4.0, BUN 21, creatinine 1.3.  BMP on the 10th is 426.  Chest x-ray on the 7th showed cardiomegaly with  venous hypertension and small right effusion.  INR on discharge is 1.5.  CK-  MB and troponins are negative x3.  Lipid profile showed a cholesterol of 87.  HDL 38, LDL 41.   DISPOSITION:  The patient is discharged in stable condition and will  followup Monday for prothrombin time.  She was informed to see Dr. Tami Ribas  on the 29th.      Erlene Quan, P.Timothy Lasso  D:  12/12/2005  T:  12/12/2005  Job:  [id number]   cc:   Wray Kearns, MD

## 2010-09-20 NOTE — H&P (Signed)
[facility]  Patient:    Kimberly [name], Kimberly [name]                        MRN: [id number] Adm. Date:  [id number] Attending:  Nicholaus Bloom CC:         Alta Corning, M.D.  Cleda Mccreedy, M.D. in [place]   History and Physical  FOLLOW-UP EVALUATION:  Massa comes in for follow-up evaluation of her complex regional pain syndrome of her left foot. Since her previous evaluation, the patient has noted partial improvement with the Percocet, but this is beginning to be less effective. She is asking about repeating the lumbar sympathetic blocks. She is quite aware of the potential risks of these, and she is aware that she will have to go off the Coumadin. She would like to come back in early February to have this done. She has planned to have Dr. Eula Fried check her PT in early February.  Her other medications are unchanged.  PHYSICAL EXAMINATION:  VITAL SIGNS:  Blood pressure 133/55, heart rate is 80, respiratory rate is 18, O2 saturation is 96%, pain level is 10/10.  NEUROLOGICAL:  She has bluish discoloration of her left foot and allodynia. Dorsalis pedis pulse is 1+.  CURRENT MEDICATIONS:  Atenolol, Premarin, Coumadin, Paxil, and temazepam.  IMPRESSION: 1. Complex regional pain syndrome of the left lower extremity. 2. Other medical problems per Dr. Eula Fried.  DISPOSITION: 1. Percocet 5/325 one to two p.o. q.6h. p.r.n., #240 with no refill. 2. Continue other medications with the exception of the Coumadin which she is    to stop 5 days before she has her blood work checked and then we will    proceed with a sympathetic nerve block in February if her PT is normalized. 3. I plan to see her in follow-up in early February.

## 2010-09-20 NOTE — Procedures (Signed)
[facility]  Patient:    Kimberly [name], Kimberly [name]                        MRN: [id number] Proc. Date: 02/26/00 Adm. Date:  [id number] Attending:  Nicholaus Bloom CC:         Alta Corning, M.D.   Procedure Report  PREOPERATIVE DIAGNOSIS:  Complex regional pain syndrome of the left lower extremity.  POSTOPERATIVE DIAGNOSIS:  Complex regional pain syndrome of the left lower extremity.  PROCEDURE:  Single shot lumbar sympathetic block.  SURGEON:  Nicholaus Bloom, M.D.  INTERVAL HISTORY:  The patient had her PT performed on Monday and it came back with a PT of ratio INR of one.  She is back for her sympathetic block today. She has not started the methadone.  Her pain level was unchanged.  PHYSICAL EXAMINATION:  Blood pressure 114/59, heart rate 75, respiratory rate 16, and O2 saturations 100%.  She exhibits no change with regard to her leg coolness and discomfort.  She had shiny atrophic skin.  DESCRIPTION OF PROCEDURE:  After informed consent was obtained, the patient was placed in the prone position after an IV was established in the right upper extremity.  Monitors were placed.  A pillow was placed under her abdomen.  Under fluoroscopic guidance, I identified lumbar vertebrae of L1 through L5.  She had a pretty significant scoliosis through the lumbar spine with a rotary scoliosis superimposed on this.  Seven centimeters lateral to the spinous process of L2, a mark was made on the skin.  The skin was prepped with Betadine x 3.  The skin was anesthetized with 1% lidocaine using a 25-gauge needle with 2 cc of 1% lidocaine.  An additional 2 cc of 1% lidocaine was administered using a 25-gauge spinal needle.  A 22-gauge Chiba needle was introduced at the anterolateral aspect of L3 by AP, lateral, and oblique projections.  Aspiration was negative for blood and CSF.  Twenty milliliters of 1% chloroprocaine was injected in 1 ml increments with negative aspirates.  Twenty milliliters of 0.25% bupivacaine was injected in 1 ml increments with negative aspirates between each increment.  The needle was flushed with 3 cc of air and removed intact.  She had prominent calcification of her aorta as well as degenerative changes through the spine.  She was allowed to sit up, but her blood pressure drifted down after receiving sedation for the procedure with fentanyl and Versed.  She was observed for 15 minutes in the supine position, and then sat up for another 15 minutes before allowing her to be discharged.  Her blood pressure stabilized nicely.  POSTPROCEDURE CONDITION:  The patient noted marked attenuation in her foot pain following the block.  DISPOSITION:  I advised her that I would like to bring her back next week to repeat the block, to see whether we could get some sustained improvement.  I am concerned that she is already a stage III with her complex regional pain syndrome and may not respond to the blocks.  I encouraged her to go ahead and start the methadone at a half tablet twice a day, _____ than a full tablet three times a day since she is so sensitive to the medications administered today.  I plan to see her back in follow up next week. DD:  02/26/00 TD:  02/26/00 Job: [id number]

## 2010-09-20 NOTE — Discharge Summary (Signed)
NAMEMarland Kitchen  ADESSA, CAVNESS                 ACCOUNT NO.:  [id number]   MEDICAL RECORD NO.:  [id number]          PATIENT TYPE:  INP   LOCATION:  2030                         FACILITY:  [facility]   PHYSICIAN:  Octavia Heir, MD  DATE OF BIRTH:  [birth year]   DATE OF ADMISSION:  12/31/2005  DATE OF DISCHARGE:  01/03/2006                                 DISCHARGE SUMMARY   DATE OF BIRTH:  Jul 29, 1926   DATE OF ADMISSION:  December 31, 2005   DATE OF DISCHARGE:  January 03, 2006   DISCHARGE DIAGNOSES:  1. Chest pain and shortness of breath on admission, resolved.  2. Class 3 congestive heart failure.  3. Chronic atrial fibrillation, status post atrioventricular node ablation      with subsequent permanent transvenous pacemaker implantation.  4. Nonischemic cardiomyopathy with ejection fraction of 15% to 20%.  5. Left bundle branch block, status post biventricular implantable      cardioverter defibrillator pacer upgrade by Dr. Lovena Le during this      admission.  6. Dyslipidemia.  7. Chronic obstructive pulmonary disease.  8. Coumadin anticoagulation therapy.   This is a 77 year old Caucasian female of Dr. Tami Ribas who presented to the  emergency room with complaints of chest pain, back pain, and shortness of  breath.  The patient said that she woke up with chest pain, back pain,  shortness of breath, left shoulder pain, around 4 a.m. on the day of  presentation.  During assessment in the emergency room she was pain free,  but she was recently at [facility] admitted from December 05, 2005,  through December 12, 2005, with chest pain for catheterization, and the  catheterization revealed mild coronary artery disease and severe left  ventricular dysfunction.  Her second admission during this month was from  December 16, 2005, through December 18, 2005, when the patient returned to the  hospital with complaints of right groin swelling and pain and it turned out  she had a hematoma, but  did not require any evacuation.   The patient was admitted.  Her D-dimer was positive and she underwent CAT  scan to rule out pulmonary embolism.   CT on December 31, 2005, revealed no evidence of pulmonary embolism,  cardiomegaly with marked enlargement of the right heart and evidence of  right-sided elevated heart pressure.   Dr. Rollene Fare saw the patient on January 01, 2006, and requested  electrophysiology consult.  Dr. Lovena Le saw the patient in consultation and  decided that upgrade to biventricular ICD is warranted on this patient with  low EF and left bundle branch block and symptoms of CHF.   On January 02, 2006, the patient underwent biventricular ICD pacer upgrade  and she again received a Medtronic device, tolerated the procedure well  without any immediate or delayed complications.  She underwent chest x-ray  the next morning that revealed no pneumothorax and good position of the ICD  leads.  Her device was interrogated the next morning and there was no  abnormal data documented.   HOSPITAL LABORATORIES:  CBC showed white blood  cell count 6.3, hemoglobin  10.8, hematocrit 31.3, platelet count 167.  BMET:  Sodium 133, potassium  3.3, chloride 9.9, CO2 27, glucose 105, BUN 6, creatinine 0.9, calcium 8.6.   BNP was 827, decreased to 476.  Thyroid was normal.  TSH was normal.  Cardiac panel was negative x2.   DISCHARGE MEDICATIONS:  1. Lisinopril 10 mg daily.  2. Aspirin 81 mg daily.  3. Coreg CR 20 mg daily.  4. Zocor 10 mg daily.  5. Lasix 40 mg daily.  6. Potassium chloride 20 mEq daily.  7. Paxil 20 mg daily.   DISCHARGE INSTRUCTIONS:  Supplemental discharge instructions for pacer was  given to the patient with explanation of arm movements and care of incision  site.  She is not to drive for a week and no lifting for 4 weeks greater  than 10 pounds.   DISCHARGE FOLLOWUP:  She will be seen by Dr. Lovena Le and by Dr. Tami Ribas in  their office and both appointments will  be scheduled by the office  assistants and the patient will be notified about the date and time.   DISCHARGE MEDICATIONS:  1. Antibiotics clindamycin 300 mg 1 pill t.i.d. for 5 days.  2. Lisinopril 10 mg daily.  3. Aspirin 81 mg daily.  4. Coreg CR 20 mg daily.  5. Zocor 10 mg daily.  6. Lasix 40 mg daily.  7. Potassium chloride 20 mEq daily.  8. Paxil 20 mg daily.   The patient was instructed to restart Coumadin at daily dose 2.5 mg daily.  Will recheck her INR on Tuesday.      York Grice, P.A.      Octavia Heir, MD  Electronically Signed    MK/MEDQ  D:  01/03/2006  T:  01/03/2006  Job:  [id number]   cc:   Champ Mungo. Lovena Le, MD

## 2010-09-20 NOTE — Op Note (Signed)
NAME:  [name], [name]                 ACCOUNT NO.:  [id number]   MEDICAL RECORD NO.:  [id number]           PATIENT TYPE:   LOCATION:                                 FACILITY:   PHYSICIAN:  Champ Mungo. Lovena Le, MD         DATE OF BIRTH:   DATE OF PROCEDURE:  01/02/2006  DATE OF DISCHARGE:                                 OPERATIVE REPORT   PROCEDURE PERFORMED:  Removal of a previously implanted single chamber  pacemaker; and insertion of a new dual-chamber biventricular ICD.   I INTRODUCTION:  The patient is a 77 year old woman with a longstanding  history of complete heart block and atrial fibrillation after AV node  ablation and pacemaker implantation done back in 1997.  Over the last year  she has had increasing dyspnea and shortness of breath.  When she does have  any significant walking she gets fatigued and has to stop.  She is unable to  do house work.  She underwent catheterization which demonstrated severe LV  dysfunction with an EF of 15%.  She is approaching ERI on her pacemaker.  With her severe LV dysfunction (EF of 15%, her class III heart failure, and  pacing induced left bundle branch block with a QRS duration of 180  milliseconds, left bundle), she is now referred for upgrade from her single  chamber pacemaker to a biventricular ICD.   II. PROCEDURE:  After informed consent was obtained, the patient was taken  to the diagnostic EP lab in the fasting state.  After the usual preparation  and draping, intravenous fentanyl and metaxalone was given for sedation.  Then 30 mL of lidocaine was infiltrated in the left infraclavicular region.  A 7-cm incision was carried out over this region and electrocautery utilized  to dissect down to the fascial plane.  Then 10 mL of contrast injected into  the left upper extremity venous system, demonstrating the left subclavian  vein to be patent.  It was subsequently punctured x2 and the Medtronic model  6949, 58-cm active fixation  defibrillation lead, serial number [id number]  was advanced into the right ventricle.   Mapping was carried out at the final site on the RV septum; and the R waves  measured 15 mV (paced); and with the lead actively fixed the pacing  impedance was 795 ohms.  The pacing threshold 0.5 volts at 0.5 milliseconds  and 10 volt pacing did not stimulate the diaphragm.  With the right  ventricular lead in satisfactory position, attention was then turned to  placement of the left ventricular lead.  The Medtronic MB II guiding  catheter was advanced into the right atrium along with a 6-French hexapolar  EP catheter.  The coronary sinus was cannulated without particular  difficulty.  The MB II guiding catheter was advanced into the coronary  sinus; and venography was to carried out.  This demonstrated a lateral  branch at approximately 2-to-3 o'clock on the mitral valve annulus; and a  posterior lateral branch at approximately 4 o'clock on the annulus.  The  lateral branch  appeared to be a better site for LV lead placement; and the  Medtronic model 4194, 88-cm passive fixation LV pacing lead, serial number  [id number] was advanced over the angioplasty guidewire into the lateral  vein.   At a position approximately two-thirds out from base-to-apex, the lead was  advanced; but, at this location, the pacing threshold was elevated.  The  lead was withdrawn slightly, for a distance about halfway from base-to-apex,  and in this location the pacing threshold was approximately 3 volts at 0.5  milliseconds; 10 volt pacing did not stimulate the diaphragm.  With this  satisfactory positioned, the lead was secured to subpectoralis fascia with  figure-of-eight silk suture; and the sewing sleeves were secured with silk  suture.  Kanamycin irrigation was utilized to irrigate the pocket; and  electrocautery was then utilized to dissect down to the previous permanent  pacemaker which had migrated down into the  axilla.  This was retrieved  without difficulty.  The old RV lead was subsequently capped.  The Medtronic  concerto biventricular ICD serial number [id number] was connected to the  LV and RV lead with the right atrial port been capped.  At this point, the  generator secured with silk suture.  Additional kanamycin was utilized to  irrigate the pocket.  Defibrillation threshold testing was carried out.   After the patient was more deeply sedated with fentanyl and Versed, VF was  induced with a T wave shock and a 15 joules shock was delivered which  terminated VF and restored sinus rhythm.  Then 5 minutes was allowed to  elapse and a second defibrillation threshold test carried out.  Again, VF  was induced with T wave shock and a 15 joules shock was, again, delivered  which terminated VF and restored sinus rhythm.  At this point, no additional  defibrillation threshold testing was carried out; and the incision was  closed with layer of 2-0 Vicryl, followed by a layer of 3-0 Vicryl, followed  by a layer of 4-0 Vicryl.  Benzoin was painted on the skin and Steri-Strips  were applied and a pressure dressing was placed; and the patient was  returned to her room in satisfactory condition.   III. COMPLICATIONS:  There were no immediate procedure complications.   IV. RESULTS:  Successful removal of a previously implanted Medtronic single  chamber pacemaker; and insertion of a new biventricular ICD in a patient  with class III heart failure of over 9 months duration, severe LV  dysfunction with an EF of 15%, and pacing induced left bundle branch block  with a QRS duration of 180 milliseconds; all in the setting of complete  heart block and chronic atrial fibrillation.           ______________________________  Champ Mungo. Lovena Le, MD     GWT/MEDQ  D:  01/02/2006  T:  01/03/2006  Job:  [id number]   cc:   Kimberly Kearns, MD  Kimberly [name]

## 2010-09-20 NOTE — Discharge Summary (Signed)
NAMEMarland Kitchen  CELISSA, TERNES                 ACCOUNT NO.:  [id number]   MEDICAL RECORD NO.:  [id number]          PATIENT TYPE:  INP   LOCATION:  6532                         FACILITY:  [facility] [facility]   PHYSICIAN:  Jeanella Craze. Little, M.D. DATE OF BIRTH:  07/16/1926   DATE OF ADMISSION:  12/16/2005  DATE OF DISCHARGE:  12/18/2005                                 DISCHARGE SUMMARY   Ms. Kimberly [name] is a 77 year old white female, who came to the office with  complaints about a groin knot.  She underwent cardiac cath on December 08, 2005, by Dr. Rex Kras.  She had nonobstructive CAD with EF of 15-20%.  She has  chronic atrial fib and she was on Coumadin and she was put on heparin to  Coumadin and then Lovenox to Coumadin during her hospitalization.  She went  home on Lovenox.  Her last INR check had been December 15, 2005, and her INR  supposedly was 1.8.  She was seen in the office by myself and Dr. Rex Kras.  Her groin was very tender.  It was decided to admit her.  She was also  complaining about back pain and we decided to get a CT scan to rule out  retroperitoneal bleed.  The CT scan showed no retroperitoneal bleed.  She  had a duplex the following morning and she had no pseudoaneurysm.  Her  Coumadin was held.  On December 18, 2005, it was decided she could just be  discharged home.  We will hold Coumadin for 1 week.  She will been back in  the office next Tuesday.  At which time, her Coumadin can be restarted.   DISCHARGE MEDICATIONS:  1. Coumadin 2.5 mg every day.  2. Simvastatin 10 mg at bedtime.  3. Lisinopril 10 mg every day.  4. Coreg 3.125 mg twice per day.  5. Lasix 40 mg every day.  6. KCL 20 mEq every day.  The KCL is on hold.  7. She should hold her aspirin for now.  8. Darvocet p.r.n. for pain.   LABS:  Sodium 127, potassium 5.6, glucose 152, BUN 21, creatinine 1.2.  December 17, 2005, her potassium was 5.1.  December 16, 2005, her INR was 1.6.  Her hemoglobin 12.1, hematocrit 35.1, WBC 6.4, and MCV  107.   DISCHARGE DIAGNOSES:  1. Right groin hematoma post cath on anticoagulation, Lovenox to Coumadin.  2. Nonobstructive coronary artery disease.  3. Nonischemic cardiomyopathy with an ejection fraction of 15-20%.  4. Chronic atrial fibrillation.  5. __________ deep vein thrombosis.  6. History of AV nodal ablation.  She is pessary dependent.  7. Moderate MR.      Cyndia Bent, N.P.    ______________________________  Jeanella Craze. Little, M.D.    BB/MEDQ  D:  12/18/2005  T:  12/18/2005  Job:  [id number]   cc:   Verlon Setting, M.D.

## 2010-09-20 NOTE — Op Note (Signed)
[facility]  Patient:    Kimberly [name], Kimberly [name]                        MRN: [id number] Proc. Date: 09/02/00 Adm. Date:  [id number] Attending:  Nicholaus Bloom CC:         Alta Corning, M.D.  Dr. Meridee Score   Operative Report  PROCEDURE:  Lumbar sympathetic block.  DIAGNOSIS:  Left foot pain with complex regional pain syndrome of the left lower extremity.  ANESTHESIOLOGIST:  Nicholaus Bloom, M.D.  INTERVAL HISTORY:   I switched her over to Duragesic, and she has noted no benefit.  She would like to go back on the Percocet which she noted was much more helpful and cheaper.  She continues to have a lot of pain in her left foot with associated coldness and color changes.  CURRENT MEDICATIONS:  Atenolol, Premarin, Paxil, and temazepam.  She has been off the ______ since last Tuesday.  PHYSICAL EXAMINATION:  EXTREMITIES:  The patient demonstrates bluish discoloration of her left foot.  VITAL SIGNS:  She is afebrile with vital signs stable.  Please see flow sheet for details.  DESCRIPTION OF PROCEDURE:  After informed consent was obtained, the patient was taken to the fluoroscopy suite where she was monitored and an IV established in her left upper extremity.  She was placed in the prone position with a pillow under her abdomen.  Using fluoroscopic guidance, I identified lumbar vertebrae L1 through L5, and I marked the spinous processes.  At 7 cm lateral to the spinous process of L2, a mark was made on the skin.  The skin was prepped with Betadine x 3 and draped.  Using a 25-gauge short needle, I injected lidocaine into the skin and subcutaneous structures to anesthetize this area.  A 25-gauge spinal needle was used to anesthetize deeper structures.  A 22-gauge Chiba needle was introduced at the anterolateral aspect of L3 by AP, lateral, and oblique projections.  The patient approximated very closely to the vertebrae.  Aspiration was negative for blood and  CSF.  I injected 15 ml of 1% chloroprocaine in 1 ml increments.  The aspirations were negative between each increment.  The foot began to pink up. I went ahead and injected 20 ml of 0.25% bupivacaine in 1 ml increments with negative aspirates between each increment.  The needle was flushed with 3 cc of air and removed intact.  POSTPROCEDURE CONDITION:  The patient noted that her foot was pinking up, and she had increased warmth on the medial aspect of the foot.  Her pain was somewhat subsided.  DISCHARGE INSTRUCTIONS: 1. Resume previous diet. 2. The patient presented today with her husband as her driver.  Limitation    per instructions and instruction sheet as outlined by my assistant    today. 3. Discontinue Duragesic and continue with the Percocet 5/325 one p.o. q.6h.    p.r.n., #100 with no refills. 4. Follow up with me in one week for repeat lumbar sympathetic block. DD:  09/02/00 TD:  09/02/00 Job: [id number]

## 2010-09-20 NOTE — Procedures (Signed)
[facility]  Patient:    Kimberly [name], Kimberly [name]                        MRN: [id number] Proc. Date: 09/09/00 Adm. Date:  [id number] Attending:  Nicholaus Bloom CC:         Alta Corning, M.D.  Cline Cools, M.D.   Procedure Report  PROCEDURE:  Lumbar sympathetic block.  DIAGNOSIS:  Left foot pain on the basis of complex regional pain syndrome.  INTERVAL HISTORY:  The patient has not noted a great deal of improvement after her last injection.  Nevertheless, she wishes to try it today.  Her medications are unchanged and include atenolol, Premarin, temazepam, and Percocet.  She is not taking her Coumadin currently.  PHYSICAL EXAMINATION:  The patient is afebrile with vital signs stable.  She has coolish discoloration of her left foot and decreased temperature.  DESCRIPTION OF PROCEDURE:  After informed consent was obtained, the patient was placed in the prone position in the fluoroscopy suite and monitored.  An IV was not established.  Using fluoroscope guidance, I identified the spinous process of L2 and 7 cm lateral to this, a mark was made on the skin.  The skin was prepped with  Betadine x 3.  I draped out the skin.  Using a 25 gauge short needle, I anesthetized the skin and subcutaneous tissues with 2 cc of 1% lidocaine.  A 25 gauge spinal needle was then used to anesthetize deeper structures with an additional 2 mL of 1% lidocaine.  A 22 gauge Chiba needle was introduced to the anterior lateral aspect of L3 by AP, lateral and oblique projections.  Aspiration was negative for blood and CSF.  I injected 15 mL of 1% chloroprocaine in 1 mL increments with negative aspirates for each increment.  Subsequently, 20 mL of 0.25% bupivacaine was injected in 1 mL increments with negative aspirates between each increment.  The needle was flushed with 3 cc of air and removed intact.  Postprocedure condition - stable with mild decrease in pain.  DISCHARGE  INSTRUCTIONS: 1. Resume previous diet. 2. Limitations on activities per instruction sheet, as outlined by my    assistant today.  Her husband was her driver. 3. Continue with the Percocet as previously prescribed. 4. Follow up with me in eight weeks.  She will need to let us know when she    needs a prescription for her Percocet. DD:  09/09/00 TD:  09/09/00 Job: [id number]

## 2010-09-20 NOTE — H&P (Signed)
[facility]  Patient:    Kimberly [name], Kimberly [name]                        MRN: [id number] Adm. Date:  [id number] Attending:  Nicholaus Bloom CC:         Dr. Otelia Sergeant   History and Physical  FOLLOW-UP EVALUATION  [name] comes in for follow-up evaluation of her complex regional pain syndrome of the left lower extremity.  Since her last evaluation she noted that the last injection lasted about four days and she had an excellent response, but has more or less returned in severity.  She notes when she takes 4-6 tablets of Percocet per day, she has pretty significant control of her pain.  She is currently rating it at 10/10.  She notes that it is less blue than it has been.  It is made worse by walking and improved by her medication.  She describes no new symptoms related to her left foot.  CURRENT MEDICATIONS: 1. Atenolol 25 mg per day. 2. Premarin. 3. Coumadin 3.5 mg per day. 4. Paxil 20 mg per day. 5. Temazepam at night. 6. Percocet 5/325 1-2 p.o. q.6h. p.r.n.  PHYSICAL EXAMINATION:  VITAL SIGNS:  Blood pressure 129/72, heart rate 77, respiratory rate 20, O2 saturations 98%, pain level is 10/10.  EXTREMITIES:  She has bluish discoloration of the left foot with coolness relative to the right with intact dorsalis pedis pulses at 1-2+ and symmetric. She has mild allodynia.  IMPRESSION: 1. Complex regional pain syndrome of the left foot. 2. Other medical problems per Dr. Charlyne Mom.  DISPOSITION: 1. I discussed switching the patient over to a long acting opiate.  She is    intolerant of methadone which caused itching.  We will go ahead and try her    on OxyContin 10 mg one p.o. q.8h., #90 with no refill.  She was made aware    of the side effects of this. 2. We discussed extensively the possibility of repeating the blocks, but she    has not had what I would appreciate as a sustained optimal response to    this, and I advised that I wished to hold off for  the time being.  I    advised her that I would like to see how she does for a month with the    OxyContin.  I plan to see her back at the end of that time to reassess. DD:  07/21/00 TD:  07/21/00 Job: [id number]

## 2010-09-20 NOTE — Assessment & Plan Note (Signed)
[facility] OFFICE NOTE   NAME:[name], Kimberly DERKS                        MRN:          [id number]  DATE:01/21/2006                            DOB:          [birth year]    Kimberly [name] is seen in the device clinic today, September 19th, for postop  followup of her Medtronic Concerto single-chamber biventricular  defibrillator, upgraded August 31st for ischemic cardiomyopathy.  She has  also had an AV node ablation in 1997.   Upon exam, the site looks good without swelling or redness.  Steri-Strips  were removed without incident.   Upon interrogation, battery voltage is 3.20 volts with the last recorded  charge time of 8.6 seconds.  Upon testing, she is dependent to a rate of 30  in the ventricle.  In the right ventricle, impedence is 560 ohm with a  threshold of 0.5 volts at 0.4 ms.  In the left ventricle, impedence is 632  ohm with a threshold of 3 volts at 0.3 ms.   No programming changes were made at this time.  Her left LV lead is  programmed to 3 volts at 0.8 ms, and she does complain of diaphragmatic  pacing when lying on her left side; however, no changes were made.  She will  be followed by Dr. Tami Ribas at [facility], and we will  see her on a p.r.n. basis.                                   Manus Rudd, RN                                Deboraha Sprang, MD, [facility]   CF/MedQ  DD:  01/21/2006  DT:  01/22/2006  Job #:  [id number]

## 2010-09-20 NOTE — Procedures (Signed)
[facility]  Patient:    Kimberly [name], Kimberly [name]                        MRN: [id number] Proc. Date: 06/19/00 Adm. Date:  [id number] Attending:  Nicholaus Bloom CC:         Alta Corning, M.D.  A.S. Eula Fried, M.D., [place], [name]   Procedure Report  PROCEDURE:  Left lumbar sympathetic block.  DIAGNOSIS:  Complex regional pain syndrome of the left lower extremity.  ANESTHESIOLOGIST:  Nicholaus Bloom, M.D.  INTERVAL HISTORY:  The patient was seen on June 03, 2000, at which time we went ahead and discussed a lumbar sympathetic block.  She has been off of her Coumadin, and her PT normalized through the course of the week.  She required two PTs before it came on down.  She notes that there is no change in the degree of her pain.  PHYSICAL EXAMINATION:  Blood pressure 118/84, heart rate 77, respiratory rate 82, O2 saturation 100%.  She demonstrates a blue, ice cold foot which is tender to touch.  DESCRIPTION OF PROCEDURE:  After informed consent was obtained, the patient was taken to the fluoroscopy suite where she was placed in the prone position with the pillow under her abdomen.  An IV was established in the left upper extremity, and a monitor was placed.  She was sedated with Versed.  I identified lumbar vertebrae L1-L5 and marked the spinous processes at each one of these.  Because of her scoliosis we had to angulate the fluoroscopic beam to accommodate this.  Then 7 cm lateral to the adjusted beam identification of the spinous process of L2.  A mark was made on the skin. The skin was prepped with Betadine x 3.  A skin wheal was raised with 2 cc of 1% lidocaine after draping the skin.  A 22 gauge Chiba needle was introduced into the anterior lateral aspect of L3 by AP, lateral and oblique projections. Aspiration was negative for blood.  Then 25 ml of 1% chloroprocaine was injected in 1 ml increments.  The patient noticed some attenuation of her pain.  Then 20  ml of 0.25% bupivacaine was injected in 1 ml increments with negative aspirates between each increment.  The needle was flushed with air and removed intact.  Post procedure condition:  The patient noted that her pain markedly decreased and she had ___________ though her foot remained fairly cold.  DISPOSITION: 1. Resume previous diet. 2. Limitation of activities per instruction sheet. 3. Continue on Percocet. 4. Follow up with me next week for repeat injection. 5. She is to remain off of her Coumadin. DD:  06/19/00 TD:  06/20/00 Job: [id number]
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
## 2010-09-20 NOTE — H&P (Signed)
[facility]  Patient:    Kimberly [name], Kimberly [name]                        MRN: [id number] Adm. Date:  [id number] Attending:  Nicholaus Bloom CC:         Alta Corning, M.D.  Cleda Mccreedy, M.D., [place], [name]   History and Physical  FOLLOW-UP EVALUATION  HISTORY OF PRESENT ILLNESS:  Zahlia comes in for follow-up evaluation of her complex regional syndrome of her left lower extremity.  Her foot has turned blue again and is cold despite our blocks.  She is having more problems with itching from her methadone and did not get the Claritin filled because it was too expensive.  She continues on the Premarin and atenolol, is back on the Coumadin, and is taking Paxil with temazepam.  We discussed going back to the Percocet since it seemed to work in the past.  She is not interested in further blocks since they did not result in sustained improvements.  PHYSICAL EXAMINATION:  GENERAL APPEARANCE:  VITAL SIGNS:  Blood pressure is 126/57, heart rate is 87, respiratory rate is 18, O2 saturation is 97%.  Pain level is back up to 10/10.  EXTREMITIES:  She has bluish discoloration of left foot with cold foot.  She has good pulse.  It is tender to palpation and has some mild edema.  IMPRESSION: 1. Complex regional pain syndrome of the left lower extremity, likely at    stage 2. 2. Depression per Dr. Eula Fried. 3. Arrhythmia per Dr. Eula Fried. 4. Tension headaches.  DISPOSITION: 1. Discontinue methadone. 2. Discontinue Claritin. 3. Percocet 5/325 one p.o. q.6h. p.r.n. #120 with no refill. 4. Continue other medications. 5. The patients blood pressure is not adequate enough to consider a trial of    Catapres patches, so will not go down that road.  She might benefit from    using Zostrix, and I will discuss this with her at her next visit. 6. Follow up with me in four weeks. DD:  04/02/00 TD:  04/02/00 Job: [id number]

## 2010-09-20 NOTE — Procedures (Signed)
[facility]  Patient:    Kimberly [name], Kimberly [name]                        MRN: [id number] Proc. Date: 03/12/00 Adm. Date:  [id number] Attending:  Nicholaus Bloom CC:         Alta Corning, M.D.  Cleda Mccreedy, M.D.   Procedure Report  PROCEDURE:  Lumbar sympathetic block.  DIAGNOSIS:  Complex regional pain syndrome of the left lower extremity.  INTERVAL HISTORY:  The patients noted that her pain has come up somewhat since her last visit. She has developed a lot of itching which she attributes to the methadone 1/2 tablet twice a day. She comes in for a third injection today. I advised her that I wish to see how she did with this but if she did not have sustained improvement, we would probably hold off on any further injections. For her itching, we discussed antihistamines and apparently with Benadryl she has had difficulties with feeling hyper if she took this so I discussed just using Claritin 10 mg which she has never had before.  PHYSICAL EXAMINATION:  Blood pressure 114/64, heart rate 76, respiratory rate 17, O2 saturations 100%, pain level 7/10. Her left foot is cold and tender to touch.  DESCRIPTION OF PROCEDURE:  After informed consent was obtained, the patient was placed in the prone position with a pillow under her abdomen. Monitors were placed and an IV established in the right upper extremity. She was sedated with fentanyl and Versed. I identified lumbar vertebra L1 through L5 and marked the spinous process of L2. I optimized visualization of the vertebra and the beam was adjusted to her scoliosis. A skin mark was made 7 cm lateral to the spinous process of L2. The area was prepped with Betadine x 3 and draped. I anesthetized the skin with 2 cc of 1% lidocaine and deeper structures using a 25 gauge spinal needle with an additional 2 ml of 1% lidocaine. A 22 gauge Chiba needle was introduced to the anterolateral aspect of L3 by AP, lateral and oblique  projections. Aspiration was negative for blood. Then 20 ml of 1% chloroprocaine was injected in 1 ml increments. Then 20 ml of 0.25% bupivacaine was injected in 1 ml increments. The needle was flushed with air and removed intact.  CONDITION POST PROCEDURE:  Stable with some moderation in pain and pinking up of her foot.  DISPOSITION: 1. Resume previous diet. 2. Limitations in activities per instruction sheet as outlined by my    assistant today. 3. Continue on the methadone at 2.5 mg b.i.d. but supplement with Claritin    10 mg 1 p.o. q.d. #30 with 2 refills. She was advised of the potential    side effects of this. 4. Followup with me in 2 weeks for a follow-up evaluation and not a block. I    want to determine whether the blocks are resulting in any sustained    improvement. DD:  03/12/00 TD:  03/12/00 Job: [id number]

## 2010-09-20 NOTE — Consult Note (Signed)
NAME:  Kimberly [name], Kimberly [name]                 ACCOUNT NO.:  [id number]   MEDICAL RECORD NO.:  [id number]          PATIENT TYPE:  INP   LOCATION:  2030                         FACILITY:  [facility]   PHYSICIAN:  Champ Mungo. Lovena Le, MD    DATE OF BIRTH:  10/04/1926   DATE OF CONSULTATION:  01/01/2006  DATE OF DISCHARGE:                                   CONSULTATION   CONSULTATION REQUESTED BY:  Dr. Terance Ice   INDICATION FOR CONSULTATION:  Evaluation of congestive heart failure in a  patient with complete heart block status post AV node ablation and chronic  atrial fibrillation.   HISTORY OF PRESENT ILLNESS:  The patient is a 77 year old woman who was  admitted to hospital with worsening heart failure symptoms and pain in the  right leg.  She has a history of chronic atrial fibrillation and is status  post pacemaker insertion and AV node ablation back in 1997.  She has a  history of increasing shortness of breath for the last several months and  was admitted to hospital back earlier this month with shortness of breath  and worsening LV dysfunction by echocardiogram (EF 15-20%).  The patient  underwent catheterization demonstrating calcification of her native  coronaries but no tight stenoses.  Several days post catheterization she  developed swelling and pain in her right groin and was subsequently found to  have a large hematoma.  This continued to increase in size and in  conjunction with her shortness of breath resulted in presentation to the  hospital.  Her additional past medical history is notable for neck surgery  in the past.  She has a history of dyslipidemia.  Her medications include  Coumadin, Zocor, lisinopril, Coreg, aspirin, Lasix, and potassium.   FAMILY HISTORY:  Is noncontributory.   SOCIAL HISTORY:  The patient is married.  She continues to smoke cigarettes.  She has a history of allergies to PENICILLIN, IODINE and SULFA.   REVIEW OF SYSTEMS:  Is notable for right groin  swelling.  Otherwise, all  systems reviewed and found to negative except as noted in the HPI.   PHYSICAL EXAMINATION:  GENERAL:  She is a pleasant 77 year old woman who  looks somewhat younger than her stated age.  VITAL SIGNS:  The blood pressure was 130/70, the pulse was 76 and regular,  the respirations were 18, temperature was 98.  HEENT:  Normocephalic and atraumatic.  Pupils equal and round.  The  oropharynx was moist.  The sclerae were anicteric.  NECK:  Revealed no jugular venous distension.  There is no thyromegaly.  Trachea was midline.  The carotids are 2+ and symmetric.  LUNGS:  Clear  bilaterally to auscultation.  There is no increased work of breathing.  There are no wheezes, rales or rhonchi.  CARDIOVASCULAR:  Revealed a regular rate and rhythm with normal S1 and S2.  There were no murmurs, rubs or gallops appreciated.  The PMI was enlarged  and laterally displaced.  ABDOMEN:  Soft, nontender, nondistended.  There was no organomegaly.  The  bowel sounds are present and there is  no rebound or guarding.  EXTREMITIES:  Demonstrated no cyanosis, clubbing or edema.  The pulses were  2+ and symmetric.  NEUROLOGIC:  Alert and oriented x3 with cranial nerves intact.  The strength  is 5/5 and symmetric.   The EKG demonstrates atrial fibrillation with underlying ventricular pacing.  Her potassium was 3.8, creatinine was 0.9.  Hemoglobin 12.  INR was 1.1  today.   IMPRESSION:  1. Nonischemic cardiomyopathy.  2. Congestive heart failure.  3. Pacing-induced left bundle branch block.  4. Complete heart block.  5. Chronic atrial fibrillation.   DISCUSSION:  I have discussed the treatment options with the patient and her  husband detail.  The risks, benefits, goals, and expectations of upgrade to  a biventricular defibrillator from a single-chamber pacemaker have been  discussed with the patient and she wishes to proceed.  This will be  scheduled the next day or two.            ______________________________  Champ Mungo. Lovena Le, MD     GWT/MEDQ  D:  01/01/2006  T:  01/02/2006  Job:  [id number]   cc:   Wiliam Ke

## 2010-09-20 NOTE — Cardiovascular Report (Signed)
NAME:  Kimberly [name], Kimberly [name]                 ACCOUNT NO.:  [id number]   MEDICAL RECORD NO.:  [id number]          PATIENT TYPE:  INP   LOCATION:  2016                         FACILITY:  [facility]   PHYSICIAN:  Jeanella Craze. Little, M.D. DATE OF BIRTH:  12/20/1926   DATE OF PROCEDURE:  12/08/2005  DATE OF DISCHARGE:                              CARDIAC CATHETERIZATION   INDICATIONS FOR TEST:  This 77 year old female has atrial fibrillation and a  permanent pacemaker.  She was admitted with several weeks of increasing  shortness of breath and chest heaviness.  She ruled out for myocardial  infarction. Her Coumadin has been discontinued, and she had been maintained  on heparin and is brought to the catheterization lab for cardiac  catheterization.   After obtaining informed consent, the patient was prepped and draped in the  usual sterile fashion exposing the right groin.  Applying anesthetic with 1%  Xylocaine, the Seldinger technique employed, and a 5-French introducer  sheath was placed into the right femoral artery.  Left and right coronary  arteriography and ventriculography in the RAO projection was performed.  A  total of three ventriculograms were performed to make sure we could  calculate an ejection fraction and make sure there was no significant ectopy  that would inferior with this.   COMPLICATIONS:  None.   EQUIPMENT:  5-French Judkins configuration catheters.   TOTAL CONTRAST USED:  105 mL   RESULTS:  1.  Hemodynamic monitoring: The central aortic pressure was 130/70.  Left      ventricular pressure was 130/14.  There was no aortic valve gradient at      the time of pullback.  2.  Ventriculography.  Ventriculography in the RAO projection using 20 mL of      contrast revealed the left ventricle to be severely dilated.  There was      severe global dysfunction with an ejection fraction around 15-20%.  The      left ventricular end-diastolic pressure was 19.  There was +1 mitral  regurgitation.  There was a ventriculogram performed with the pigtail      catheter right underneath the aortic valve, and, when it was injected,,      the pigtail catheter was pulled back into the aorta, and aortic root      revealed no significant aortic insufficiency and an appropriate diameter      aortic root.   CORONARY ARTERIOGRAPHY:  There was dense calcification in the proximal  portion of the left main, the proximal third of the LAD, and also at the  bifurcation of the LAD and the diagonal.  1.  Left main normal. It bifurcated.  2.  LAD.  The LAD crossed the apex of the heart.  In the mid portion of the      LAD at the bifurcation of the diagonal was an area of 30% narrowing in      the LAD itself.  There were minimal irregularities in the proximal      portion where the more dense calcification was seen.  The diagonal  itself was a moderate-size vessel without significant irregularities.  3.  Circumflex.  The circumflex gave rise to a large OM vessel and      bifurcated.  At the bifurcation of the circumflex was an area of 30-40%      narrowing.  The ongoing circumflex right at the bifurcation had an area      of 40% narrowing.  4.  Right coronary artery. The right coronary had no significant disease.      The PDA and small posterolateral branch were seen, and they were free of      disease also.   CONCLUSION:  1.  Dense calcification of the left anterior descending and left main with      no high-grade stenosis.  Mild disease in the circumflex and left      anterior descending system.  2.  Severe left ventricular dysfunction with dilatation of the left      ventricle.  3.  Ejection fraction approximately 15 to 20%.  4.  Left ventricular end-diastolic pressure of 90.  5.  +1 mitral regurgitation.   I have the Tenormin and placed her on Coreg 20 mg.  I also placed her on  lisinopril 10 mg.  This is all because of her LV dysfunction.   She will be restarted on her  heparin in about 3 hours after the  catheterization, and we will start her back on p.o. Coumadin tonight.  I  cannot explain this significant decrease in LV function, but it is  relatively clear that this is the explanation for her dyspnea.           ______________________________  Jeanella Craze. Little, M.D.     ABL/MEDQ  D:  12/08/2005  T:  12/08/2005  Job:  [id number]   cc:   Octavia Heir, MD  Dr. Okey Regal, [facility]  Catheterization Lab

## 2010-09-30 NOTE — Discharge Summary (Signed)
NAME:  Kimberly [name], Kimberly [name]                 ACCOUNT NO.:  [id number]  MEDICAL RECORD NO.:  [id number]           PATIENT TYPE:  I  LOCATION:  [id number]                         FACILITY:  [facility]  PHYSICIAN:  Marlowe Cinquemani I Vernal Hritz, MD      DATE OF BIRTH:  January 24, 1927  DATE OF ADMISSION:  08/25/2010 DATE OF DISCHARGE:  08/29/2010                              DISCHARGE SUMMARY   DISCHARGE DIAGNOSES: 1. Enterobacter cloacae urinary tract infection, sensitive to Cipro. 2. Acute renal failure resolved. 3. History of atrial fibrillation and chronic anticoagulation. 4. History of chronic back and neck pain secondary to severe scoliosis     and chronic T12 compression fracture and severe lumbar spondylosis. 5. Microcytic anemia, hemoglobin around 10.1 and hematocrit 29.5.  CONSULTATION: 1. Orthopedics consulted for the patient's back pain. 2. Dr. Marcial Pacas consulted.  MEDICATIONS: 1. Cipro 250 mg p.o. b.i.d. for 3 days. 2. Coreg 6.25 mg twice daily. 3. Furosemide 40 mg daily. 4. Hydrocodone/APAP 5/500 q.4 h. as needed. 5. Protonix 40 mg p.o. daily. 6. Temazepam 30 mg daily day. 7. Warfarin 2.5 mg alternate with 4 mg every other day.  The patient needs to check her Coumadin level.  After discharge, we will provide home health RN and PT.  PROCEDURES: 1. X-ray of the lumbar spine, lumbar spondylosis and scoliosis.  No     other findings. 2. CT of C-spine.  Fusion at C5-7.  Mild recurrent foraminal     enlargement potentially affecting the C5 nerve and C4-C5 has a     degeneration with right greater than left foraminal enlargement     secondary to uncovertebral joint and facet disease. 3. CT of lumbar spine.  Severe dextroconvex scoliosis with apex at L1,     chronic T12 compression fracture.  Severe multilevel lumbar     spondylosis.  HISTORY OF PRESENT ILLNESS:  This is an 77 year old female with history of CHF, EF 15-20%.  She presented with back pain.  She does have a history of fibromyalgia.  No chest  pain.  On presentation, she found to have white blood cells 6.5, hemoglobin 11.6, sodium 138, potassium 2.8, creatinine 1.9, and urinalysis too numerous to count white blood cells and EKG appears to base.  INR was 2.25. 1. Urinary tract infection.  The patient treated and the patient found     to have Enterobacter cloacae UTI and treated with Cipro for another     3 days. 2. Acute renal failure resolved after holding Lasix. 3. Her congestive heart failure remain stable, and the patient started     on the at Lasix 40 mg p.o. daily secondary to the history of CHF     and low EF. 4. Hypokalemia corrected. 5. AFib.  Restarted on Coreg and rate remained under control.     Coumadin was started, but her INR dropped down to 1.38.  despite     Coumadin could to be related to Cipro.  The patient resumed her     dose of Coumadin per pharmacy and home nurse will come to check the     patient's Coumadin level.  Plan to keep Coumadin between 2 and 3. 6. Back pain.  Orthopedics consulted, and the patient's back pain     secondary to severe degenerative disease currently secondary to the     patient's comorbidities and EF of 15 also declined any further     surgical intervention, and we agreed with the current plan of only     pain control. 7. CHF status post ICD.  This has remained stable, and needs to follow     up with her primary care physician as well as Dr. Elisabeth [name], has     appointment time.  Currently, we feel the patient is stable for     discharge.     Kimberly Thackeray Franco Collet, MD     HIE/MEDQ  D:  09/26/2010  T:  09/26/2010  Job:  [id number]  cc:   Tery Sanfilippo, MD Dimas Alexandria, MD  Electronically Signed by Donia Ast MD on 09/30/2010 04:32:01 PM

## 2010-10-18 ENCOUNTER — Other Ambulatory Visit: Payer: Self-pay | Admitting: Cardiovascular Disease

## 2010-10-18 ENCOUNTER — Ambulatory Visit
Admission: RE | Admit: 2010-10-18 | Discharge: 2010-10-18 | Disposition: A | Discharge status: Home / Self Care | Payer: Medicare Other | Source: Ambulatory Visit | Attending: Cardiovascular Disease | Admitting: Cardiovascular Disease

## 2010-10-18 DIAGNOSIS — R0602 Shortness of breath: Secondary | ICD-10-CM

## 2010-10-22 ENCOUNTER — Ambulatory Visit (HOSPITAL_COMMUNITY)
Admission: RE | Admit: 2010-10-22 | Discharge: 2010-10-23 | Disposition: A | Discharge status: Home / Self Care | Payer: Medicare Other | Source: Ambulatory Visit | Attending: Cardiovascular Disease | Admitting: Cardiovascular Disease

## 2010-10-22 DIAGNOSIS — G8929 Other chronic pain: Secondary | ICD-10-CM | POA: Insufficient documentation

## 2010-10-22 DIAGNOSIS — Z79899 Other long term (current) drug therapy: Secondary | ICD-10-CM | POA: Insufficient documentation

## 2010-10-22 DIAGNOSIS — Z01812 Encounter for preprocedural laboratory examination: Secondary | ICD-10-CM | POA: Insufficient documentation

## 2010-10-22 DIAGNOSIS — M549 Dorsalgia, unspecified: Secondary | ICD-10-CM | POA: Insufficient documentation

## 2010-10-22 DIAGNOSIS — Z7901 Long term (current) use of anticoagulants: Secondary | ICD-10-CM | POA: Insufficient documentation

## 2010-10-22 DIAGNOSIS — I428 Other cardiomyopathies: Secondary | ICD-10-CM | POA: Insufficient documentation

## 2010-10-22 DIAGNOSIS — Z4502 Encounter for adjustment and management of automatic implantable cardiac defibrillator: Secondary | ICD-10-CM | POA: Insufficient documentation

## 2010-10-22 LAB — PROTIME-INR
INR: 1.03 (ref 0.00–1.49)
Prothrombin Time: 13.7 seconds (ref 11.6–15.2)

## 2010-10-22 LAB — SURGICAL PCR SCREEN: Staphylococcus aureus: NEGATIVE

## 2010-10-23 ENCOUNTER — Ambulatory Visit (HOSPITAL_COMMUNITY): Payer: Medicare Other

## 2010-10-23 LAB — PROTIME-INR
INR: 0.98 (ref 0.00–1.49)
Prothrombin Time: 13.2 seconds (ref 11.6–15.2)

## 2010-10-29 NOTE — Discharge Summary (Signed)
NAME:  Kimberly [name], GALANT                 ACCOUNT NO.:  [id number]  MEDICAL RECORD NO.:  [id number]  LOCATION:  6527                         FACILITY:  [facility]  PHYSICIAN:  Quay Burow, M.D.   DATE OF BIRTH:  August 16, 1926  DATE OF ADMISSION:  10/22/2010 DATE OF DISCHARGE:  10/23/2010                              DISCHARGE SUMMARY   DISCHARGE DIAGNOSES: 1. Status post biventricular automatic implantable cardioverter-     defibrillator change out to biventricular pacemaker. 2. The explanted device was a Medtronic Concerto and implanted device     was a Medtronic [id number].  HOSPITAL COURSE:  Ms. Tegtmeier is an 77 year old female with a history of severe nonischemic cardiomyopathy, status post implantation of a dual- chamber BiV defibrillator by Dr. Crissie Sickles in 2007.  This device has reached elective replacement interval.  She has never had defibrillator discharges and most recent evaluation shows her left ventricular systolic function has dramatically improved and her ejection fraction is now greater than 55%.  On October 22, 2010, she underwent change out to [id number] Medtronic Concerto device and this was implanted without complications.  The patient has been seen by Dr. Gwenlyn Found who feels she is stable for discharge home to follow up with Dr. Sallyanne Kuster in 1 week.  DISCHARGE LABORATORY DATA:  None.  STUDIES/PROCEDURES:  October 22, 2010: 1. BiV AICD generator change out.  Replaced with a biventricular     pacemaker. 2. Medtronic Concerto 0000000 was explanted and the new device was a     Medtronic [id number], serial number [id number].  This device was     implanted without complications.  DISCHARGE MEDICATIONS: 1. Acetaminophen 325 mg one to two tablets by mouth every 4 hours as     needed for pain. 2. Ibuprofen 200 mg 1 tablet by mouth daily as needed. 3. Nitroglycerin sublingual 0.4 mg 1 tablet under the tongue every 5     minutes up to three doses total for chest pain. 4. Potassium chloride 20 mEq  2 tablets by mouth daily. 5. Carvedilol 6.25 mg 1 tablet by mouth twice daily. 6. Furosemide 40 mg 1 tablet by mouth every morning. 7. Hydrocodone/APAP 5/500 mg 1 tablet by mouth every 6 hours as needed     for pain. 8. Paroxetine 40 mg 1 tablet by mouth daily. 9. Temazepam 30 mg 1 tablet by mouth daily at bedtime. 10.Warfarin 3 mg 1 tablet by mouth daily.  DISPOSITION:  Ms. Broussard will be discharged home in a stable condition. It was recommended she increase her activity slowly.  She may bathe but she needs to keep the incision site dry for 1 week.  No lifting for 1 week and nothing heavy for 6 weeks thereafter and no driving for 1 week. I would recommend she stays in the sling for one more day and then no lifting the arm above the shoulder for 1 week.  If the pacer site becomes red, painful, swollen, or discharges fluid or pus, she is to call our office immediately.  She will follow up with Dr. Sallyanne Kuster in approximately 1 week and our office call with the appointment time.    ______________________________ Tarri Fuller, PA   ______________________________  Quay Burow, M.D.    BH/MEDQ  D:  10/23/2010  T:  10/24/2010  Job:  [id number]  cc:   Sanda Klein, MD  Electronically Signed by Tarri Fuller PA on 10/24/2010 03:20:26 PM Electronically Signed by Quay Burow M.D. on 10/29/2010 09:23:52 AM

## 2010-10-31 NOTE — Op Note (Signed)
NAMEMarland [name]  Kimberly, [name]                 ACCOUNT NO.:  [id number]  MEDICAL RECORD NO.:  [id number]  LOCATION:  6527                         FACILITY:  [facility]  PHYSICIAN:  Sanda Klein, MD     DATE OF BIRTH:  [birth year]  DATE OF PROCEDURE:  10/22/2010 DATE OF DISCHARGE:                              OPERATIVE REPORT   PROCEDURES PERFORMED: 1. Removal of old biventricular pacemaker/defibrillator generator. 2. Insertion of a new biventricular pacemaker (CRTP device). 3. Fluoroscopy. 4. Moderate sedation. 5. Testing of chronic left ventricular and chronic right ventricular     pace sense leads, attached to the pacemaker generator. 6. Capping of chronic defibrillator lead (Medtronic Sprint Fidelis     [phone] "Recall" lead).  PREOPERATIVE DIAGNOSES: 1. Remote history of severe nonischemic cardiomyopathy due to     uncontrolled atrial fibrillation with rapid ventricular response     and right ventricular apical pacing; greatly improved left     ventricular systolic function now in the normal range following     cardiac resynchronization therapy. 2. Medtronic Sprint Fidelis [phone] lead under "Recall." 3. History of atrial fibrillation with rapid ventricular response     status post atrioventricular node ablation (the patient is     pacemaker dependent). 4. No recent episodes of congestive heart failure.  Ms. Pomar is an 77 year old woman that had longstanding problems with atrial fibrillation that appeared to have lead to tachycardia related cardiomyopathy further worsened by right ventricular apical pacing following AV node ablation.  She had a remarkable improvement in the left ventricle systolic function following AV node ablation and institution of CRT therapy.  Since her left ventricle ejection fraction had been severely depressed, she has received a CRT-D device. Unfortunately, her defibrillator lead is a potentially malfunctioning Sprint Fidelis lead with a higher rate of spontaneous  fracture.  The patient's defibrillator generator has reached elective replacement interval.  We had a lengthy discussion regarding options of therapy. One option would be to implant a new right ventricular defibrillator lead and a new CRTP device.  However, since the patient's systolic function has improved so dramatically and since she has never had sustained or nonsustained ventricular tachyarrhythmias, the decision was reached after discussion with the patient to replace her defibrillator generator with a CRTP (biventricular pacemaker) device.  After risks and benefits of the procedure described, the patient provided informed consent.  She was brought to the cardiac cath lab in a fasting state.  Then the left prepectoral area was prepped and draped in usual sterile fashion.  Local anesthesia with 1% lidocaine was administered to the area of the defibrillator implantation.  A 6-cm horizontal incision was made at the level of the generator.  The decision was consciously made to avoid performing an incision at the site of previous complicated device implantation and pocket revision (history of pocket hematoma and potential superficial infection).  The skin in this area had considerable fat atrophy and potentially reduced healing properties.  Very limited electrocautery was used slowly at the level of the skin. Blunt and sharp dissection was used to expose the old defibrillator pocket.  The skin was remarkably thin and the fatty tissue very friable. The pocket  was opened and then the defibrillator generator was removed from the pocket.  The leads were left connected since the patient was pacemaker dependent.  The old right ventricular pace sense lead that had been used with her initial single chamber pacemaker was identified at the bottom of the pocket and carefully dissected away from the scar tissue.  The lead cap was removed and the lead was tested and sensing and pacing  parameters were found to be excellent.  It was therefore decided that this chronic right ventricular lead will be used together with the patient's left ventricular lead for CRTP therapy.  The pocket was flushed with copious amounts of antibiotic solution and carefully inspected for hemostasis which was found to be excellent.  The old right ventricular pace sense lead was attached to the new CRTP generator and appropriate ventricular pacing was seen.  The chronic left ventricular lead was disconnected from the defibrillator generator and connected to the new CRTP device.  The chronic defibrillator leads were disconnected from the old generator and the pace sense port as well as the defibrillation coil tendons were capped.  The caps were secured in place with 2-0 silk.  Due to the perceived increased risk of infection, the decision was made to use AIGIS antibiotic impregnated pouch.  The new CRTP generator was carefully placed in the appropriately prepared pouch and then the entire system was carefully placed in the old pocket with great care was taken that the pouch assume a comfortable position without pressure on the edges of the pocket or the wound.  Care was taken that the capped defibrillator pins were located deep to the generator in the pouch.  The pocket was then closed with a 2-0 Vicryl and then a layer of cutaneous staples.  Sterile dressing was applied.  COMPLICATIONS:  None.  ESTIMATED BLOOD LOSS:  Less than 5 mL.  DEVICE DETAILS:  Existing leads: 1. The chronic right ventricular pace sense lead implanted on November     24, __________ is a Medtronic 4024-58 cm serial [id number]. 2. The left ventricular lead is a Medtronic 4194-88 cm, serial     [id number] inserted on January 02, 2006. Both these leads were used with the new device. 1. The chronic right ventricular defibrillator lead is a Medtronic     Sprint Fidelis [id number] cm lead serial [id number] inserted on      January 02, 2006.  This lead was capped, but left in place. 2. The new generator is a Medtronic [id number] CRTP device, serial     [id number]. The explanted biventricular defibrillator was a Medtronic Concerta model [id number] serial [id number] implanted on January 02, 2006.  Final measurements of the chronic right ventricular lead (4024-58 cm) at the end of the procedure showed the following:  Sensed R-waves 8.0 mV (LV paced; the patient is pacemaker dependent) impedance 904 ohms, threshold 0.3 volts at 0.5 milliseconds pulse width.  The output on this was programed at 2.5 volts at 0.4 milliseconds pulse width.  The chronic left ventricular lead threshold was found to be best in the LV tip to can configuration at 1.25 volts at 1.0 milliseconds pulse width.  The previous configuration had been bipolar 2.5 volts at 0.8 milliseconds pulse width.  The decision was made to change program to the LV tip to can configuration with a 1 volt safety margin.  The device was programed VVIR with a lower rate of 70 beats per minute and a upper rate of 120 beats per minute.  Of  note, Ms. Heeke warfarin has been interrupted primarily to allow lumbar spine injection to be performed by Dr. Marcial Pacas.  This was scheduled to be done tomorrow at 3 p.m.  Warfarin should be resumed immediately afterwards.     Sanda Klein, MD     MC/MEDQ  D:  10/22/2010  T:  10/23/2010  Job:  [id number]  cc:   [facility] Dimas Alexandria, MD  Electronically Signed by Sanda Klein M.D. on 10/31/2010 04:27:06 PM

## 2010-11-08 HISTORY — PX: OTHER SURGICAL HISTORY: SHX169

## 2010-11-28 ENCOUNTER — Emergency Department (HOSPITAL_COMMUNITY): Payer: Medicare Other

## 2010-11-28 ENCOUNTER — Emergency Department (HOSPITAL_COMMUNITY)
Admission: EM | Admit: 2010-11-28 | Discharge: 2010-11-28 | Disposition: A | Discharge status: Home / Self Care | Payer: Medicare Other | Attending: Emergency Medicine | Admitting: Emergency Medicine

## 2010-11-28 DIAGNOSIS — R0789 Other chest pain: Secondary | ICD-10-CM | POA: Insufficient documentation

## 2010-11-28 DIAGNOSIS — I509 Heart failure, unspecified: Secondary | ICD-10-CM | POA: Insufficient documentation

## 2010-11-28 DIAGNOSIS — I2510 Atherosclerotic heart disease of native coronary artery without angina pectoris: Secondary | ICD-10-CM | POA: Insufficient documentation

## 2010-11-28 DIAGNOSIS — Z950 Presence of cardiac pacemaker: Secondary | ICD-10-CM | POA: Insufficient documentation

## 2010-11-28 DIAGNOSIS — I10 Essential (primary) hypertension: Secondary | ICD-10-CM | POA: Insufficient documentation

## 2010-11-28 LAB — CK TOTAL AND CKMB (NOT AT ARMC): Total CK: 100 U/L (ref 7–177)

## 2010-11-28 LAB — COMPREHENSIVE METABOLIC PANEL
AST: 27 U/L (ref 0–37)
BUN: 17 mg/dL (ref 6–23)
CO2: 27 mEq/L (ref 19–32)
Chloride: 100 mEq/L (ref 96–112)
Creatinine, Ser: 1.21 mg/dL — ABNORMAL HIGH (ref 0.50–1.10)
GFR calc Af Amer: 52 mL/min — ABNORMAL LOW (ref >60)
GFR calc non Af Amer: 43 mL/min — ABNORMAL LOW (ref >60)
Glucose, Bld: 93 mg/dL (ref 70–99)
Total Bilirubin: 0.3 mg/dL (ref 0.3–1.2)

## 2010-11-28 LAB — URINALYSIS, ROUTINE W REFLEX MICROSCOPIC
Hgb urine dipstick: NEGATIVE
Nitrite: NEGATIVE
Protein, ur: NEGATIVE mg/dL
Urobilinogen, UA: 0.2 mg/dL (ref 0.0–1.0)

## 2010-11-28 LAB — DIFFERENTIAL
Basophils Absolute: 0 10*3/uL (ref 0.0–0.1)
Basophils Relative: 0 % (ref 0–1)
Eosinophils Absolute: 0.2 10*3/uL (ref 0.0–0.7)
Eosinophils Relative: 3 % (ref 0–5)
Monocytes Absolute: 0.8 10*3/uL (ref 0.1–1.0)

## 2010-11-28 LAB — CBC
MCHC: 33.4 g/dL (ref 30.0–36.0)
Platelets: 256 10*3/uL (ref 150–400)
RDW: 13.7 % (ref 11.5–15.5)
WBC: 6.4 10*3/uL (ref 4.0–10.5)

## 2010-11-28 LAB — PROTIME-INR
INR: 1.65 — ABNORMAL HIGH (ref 0.00–1.49)
Prothrombin Time: 19.8 seconds — ABNORMAL HIGH (ref 11.6–15.2)

## 2010-11-28 LAB — TROPONIN I: Troponin I: <0.3 ng/mL (ref <0.30)

## 2010-11-28 LAB — URINE MICROSCOPIC-ADD ON

## 2011-01-22 ENCOUNTER — Encounter: Payer: Self-pay | Admitting: *Deleted

## 2011-01-22 ENCOUNTER — Other Ambulatory Visit: Payer: Self-pay

## 2011-01-22 ENCOUNTER — Emergency Department (HOSPITAL_COMMUNITY)
Admission: EM | Admit: 2011-01-22 | Discharge: 2011-01-23 | Disposition: A | Discharge status: Other Acute Inpatient Hospital | Payer: Medicare Other | Attending: Emergency Medicine | Admitting: Emergency Medicine

## 2011-01-22 ENCOUNTER — Emergency Department (HOSPITAL_COMMUNITY): Payer: Medicare Other

## 2011-01-22 DIAGNOSIS — M4120 Other idiopathic scoliosis, site unspecified: Secondary | ICD-10-CM | POA: Insufficient documentation

## 2011-01-22 DIAGNOSIS — Z950 Presence of cardiac pacemaker: Secondary | ICD-10-CM | POA: Insufficient documentation

## 2011-01-22 DIAGNOSIS — T43502A Poisoning by unspecified antipsychotics and neuroleptics, intentional self-harm, initial encounter: Secondary | ICD-10-CM | POA: Insufficient documentation

## 2011-01-22 DIAGNOSIS — F329 Major depressive disorder, single episode, unspecified: Secondary | ICD-10-CM

## 2011-01-22 DIAGNOSIS — T424X4A Poisoning by benzodiazepines, undetermined, initial encounter: Secondary | ICD-10-CM | POA: Insufficient documentation

## 2011-01-22 DIAGNOSIS — F3289 Other specified depressive episodes: Secondary | ICD-10-CM | POA: Insufficient documentation

## 2011-01-22 DIAGNOSIS — I4891 Unspecified atrial fibrillation: Secondary | ICD-10-CM | POA: Insufficient documentation

## 2011-01-22 DIAGNOSIS — Z9079 Acquired absence of other genital organ(s): Secondary | ICD-10-CM | POA: Insufficient documentation

## 2011-01-22 HISTORY — DX: Major depressive disorder, single episode, unspecified: F32.9

## 2011-01-22 HISTORY — DX: Depression, unspecified: F32.A

## 2011-01-22 HISTORY — DX: Unspecified osteoarthritis, unspecified site: M19.90

## 2011-01-22 HISTORY — DX: Unspecified atrial fibrillation: I48.91

## 2011-01-22 HISTORY — DX: Scoliosis, unspecified: M41.9

## 2011-01-22 HISTORY — DX: Presence of cardiac pacemaker: Z95.0

## 2011-01-22 LAB — URINE MICROSCOPIC-ADD ON

## 2011-01-22 LAB — RAPID URINE DRUG SCREEN, HOSP PERFORMED
Barbiturates: NOT DETECTED
Cocaine: NOT DETECTED
Opiates: NOT DETECTED
Tetrahydrocannabinol: NOT DETECTED

## 2011-01-22 LAB — DIFFERENTIAL
Basophils Relative: 0 % (ref 0–1)
Eosinophils Absolute: 0.1 10*3/uL (ref 0.0–0.7)
Eosinophils Relative: 1 % (ref 0–5)
Lymphs Abs: 0.9 10*3/uL (ref 0.7–4.0)
Neutrophils Relative %: 78 % — ABNORMAL HIGH (ref 43–77)

## 2011-01-22 LAB — URINALYSIS, ROUTINE W REFLEX MICROSCOPIC
Glucose, UA: NEGATIVE mg/dL
Ketones, ur: NEGATIVE mg/dL
Nitrite: POSITIVE — AB
Protein, ur: NEGATIVE mg/dL

## 2011-01-22 LAB — BASIC METABOLIC PANEL
BUN: 13 mg/dL (ref 6–23)
Calcium: 9.5 mg/dL (ref 8.4–10.5)
GFR calc Af Amer: >60 mL/min (ref >60)
GFR calc non Af Amer: 57 mL/min — ABNORMAL LOW (ref >60)
Glucose, Bld: 86 mg/dL (ref 70–99)
Potassium: 3.4 mEq/L — ABNORMAL LOW (ref 3.5–5.1)
Sodium: 133 mEq/L — ABNORMAL LOW (ref 135–145)

## 2011-01-22 LAB — CBC
MCH: 34.5 pg — ABNORMAL HIGH (ref 26.0–34.0)
MCHC: 34.9 g/dL (ref 30.0–36.0)
MCV: 98.9 fL (ref 78.0–100.0)
Platelets: 179 10*3/uL (ref 150–400)

## 2011-01-22 LAB — SALICYLATE LEVEL: Salicylate Lvl: <2 mg/dL — ABNORMAL LOW (ref 2.8–20.0)

## 2011-01-22 MED ORDER — CIPROFLOXACIN HCL 250 MG PO TABS: 500.0000 mg | ORAL_TABLET | Freq: Once | ORAL | Status: DC

## 2011-01-22 MED ORDER — CIPROFLOXACIN HCL 250 MG PO TABS
500.0000 mg | ORAL_TABLET | Freq: Once | ORAL | Status: AC
Start: 2011-01-22 — End: 2011-01-22
  Administered 2011-01-22: 500 mg via ORAL
  Filled 2011-01-22: qty 2

## 2011-01-22 NOTE — ED Notes (Signed)
Given dinner tray.  Pt states she is not hungry at this time.  Pt calm, cooperative, nad noted.  Pt records faxed to [facility] - pending acceptance.  Sitter at bedside.  Pt resting comfortably.

## 2011-01-22 NOTE — ED Notes (Signed)
Son is leaving at this time. Was advised to call at any time w/ any changes or additional information as it happens.

## 2011-01-22 NOTE — ED Notes (Signed)
Pt resting calmly w/ eyes closed. Rise & fall of the chest noted.  Bed in low position, side rails up x2. NAD noted & no needs voiced at this time. Sitter remains outside pt room.

## 2011-01-22 NOTE — ED Notes (Signed)
Pt awake, alert, no longer lethargic/drowsy.  Informed pt of plan of care.  Pt verbalized understanding.  Sitter at bedside.  nad noted.

## 2011-01-22 NOTE — ED Notes (Signed)
Sitter at bedside at this time.  Pt had Temazepam 30mg  rx refilled on 01/06/2011 with qty of 90 capsules.  Instructions are to take 1 capsule at bedtime as needed.  As of today, pt has 54 capsules left in bottle.

## 2011-01-22 NOTE — ED Notes (Signed)
Per Tommy with [facility] - if [facility] does not accept pt for treatment to call [facility] with [facility].

## 2011-01-22 NOTE — ED Notes (Signed)
Pt has had 3 episodes of loose stool since approx 1500.  Pt denies having abd pain.  States diarrhea is normal for her.  EDP notified and no new orders received.

## 2011-01-22 NOTE — ED Notes (Signed)
Per Kimberly [name] with ACT team, pt pending acceptance at [facility]

## 2011-01-22 NOTE — ED Notes (Signed)
Per EMS - pt states she took "a handful" of temazepam 30mg  approx 1800 yesterday.  When questioned pt if she was trying to hurt herself pt states "yes".  Pt admits to being depressed about her husband being placed in SNF.  Pt alert and oriented, drowsy, slightly lethargic at this time.  nad noted.

## 2011-01-22 NOTE — ED Notes (Signed)
Per EDP - pt medically cleared and no longer needs telemetry monitoring.  Pt placed in paper scrubs and moved to Rm 3.  Meal tray given.  nad noted.  Sitter at bedside.

## 2011-01-22 NOTE — ED Provider Notes (Addendum)
History   Chart scribed for Elmer Picker, MD by Caryl Bis; the patient was seen in room APA02/APA02; this patient's care was started at 11:27 AM.    CSN: [id number] Arrival date & time: 01/22/2011 11:16 AM   Chief Complaint  Patient presents with  . ? Overdose      HPI [name] is a 77 y.o. female who presents to the Emergency Department complaining of depression. Pt states she is depressed because she is lonely since her husband moved in with her daughter in [place] because she could not take care of him anymore. Pt now lives alone. Pt admits to taking "a handful of lorazepam" this AM to hurt herself. Denies any previous attempts. Also admits to drinking a little bit this AM. Denies taking any other meds. No v/d, chest pain, abd pain, Kimberly headache.   Past Medical History  Diagnosis Date  . Depression   . Atrial fibrillation   . Scoliosis   . Osteoarthritis   . Pacemaker      Past Surgical History  Procedure Date  . Pacemaker insertion   . Hernia repair   . Abdominal hysterectomy   . Cholecystectomy   . Appendectomy     No family history on file.  History  Substance Use Topics  . Smoking status: Never Smoker   . Smokeless tobacco: Not on file  . Alcohol Use: No    OB History    Grav Para Term Preterm Abortions TAB SAB Ect Mult Living                  Review of Systems 10 Systems reviewed and are negative for acute change except as noted in the HPI.  Allergies  Iodine; Papaya derivatives; Penicillins; and Sulfa antibiotics  Home Medications   Current Outpatient Rx  Name Route Sig Dispense Refill  . CARVEDILOL 6.25 MG PO TABS Oral Take 6.25 mg by mouth 2 (two) times daily with a meal.      . FUROSEMIDE 40 MG PO TABS Oral Take 40 mg by mouth daily.      [name] HYDROCODONE-ACETAMINOPHEN 5-500 MG PO TABS Oral Take 1 tablet by mouth 4 (four) times daily.      . IBUPROFEN-DIPHENHYDRAMINE CIT 200-38 MG PO TABS Oral Take 1 tablet by mouth at bedtime  as needed. Sleep aid     . PAROXETINE HCL 40 MG PO TABS Oral Take 40 mg by mouth every morning.      [name] PREDNISONE 10 MG PO TABS Oral Take 10 mg by mouth daily.      [name] TEMAZEPAM 30 MG PO CAPS Oral Take 30 mg by mouth at bedtime as needed. Sleep aid     . WARFARIN SODIUM 3 MG PO TABS Oral Take 3 mg by mouth at bedtime.        Physical Exam    BP 138/50  Pulse 69  Temp(Src) 97.6 F (36.4 C) (Oral)  Resp 20  Ht 5\' 8"  (1.727 m)  Wt 121 lb (54.885 kg)  BMI 18.40 kg/m2  SpO2 98%  Physical Exam  Nursing note and vitals reviewed. Constitutional: She is oriented to person, place, and time. She appears well-developed and well-nourished.       Drowsy but awake and answering questions appropriately  HENT:  Head: Normocephalic.  Mouth/Throat: Oropharynx is clear and moist and mucous membranes are normal.  Eyes: Conjunctivae are normal.  Neck: Normal range of motion. Neck supple. Carotid bruit is not present.  Cardiovascular:  Normal rate, regular rhythm and intact distal pulses.  Exam reveals no gallop and no friction rub.   No murmur heard. Pulmonary/Chest: Effort normal and breath sounds normal. She has no wheezes. She has no rales.       Lungs clear and equal  Abdominal: Soft. There is no tenderness.  Musculoskeletal: Normal range of motion.  Neurological: She is alert and oriented to person, place, and time.  Skin: Skin is warm and dry. No rash noted.  Psychiatric: She exhibits a depressed mood. She expresses suicidal ideation.    Procedures - none  OTHER DATA REVIEWED: Nursing notes and vital signs reviewed. Prior records reviewed.   ED COURSE: Pt had rx refilled on 01/06/11 for 90 capsules of 30mg  Temazepam qhs; 54 capsules remaining today which calculates to 20 capsules missing (total 600mg  that pt potentially took today)  MDM: Intentional overdose with benzodiazepines. Depression  1:55 PM I spoke with Tom E. from the act team.  He will see the patient in the emergency  department for evaluation of a suicide attempt  2:23 PM Konrad Dolores has seen the pt.  He has sent her information to [facility] where we hope to have her admitted.   IMPRESSION: 1. Depression      PLAN: All results reviewed and discussed with pt, questions answered, pt agreeable with plan.   MEDS GIVEN IN ED: 01/22/2011 1818 ciprofloxacin (CIPRO) tablet 500 mg 500 mg Oral Given   SCRIBE ATTESTATION: I personally performed the services described in this documentation, which was scribed in my presence. The recorded information has been reviewed and considered. Elmer Picker, MD    Date: 01/22/2011  Rate: 70  Rhythm: electronic ventricular pacemaker  QRS Axis: normal  Intervals: normal  ST/T Wave abnormalities: normal  Conduction Disutrbances:none  Narrative Interpretation:   Old EKG Reviewed: unchanged   LABS: Results for orders placed during the hospital encounter of 01/22/11  CBC      Component Value Range   WBC 6.6  4.0 - 10.5 (K/uL)   RBC 3.48 (*) 3.87 - 5.11 (MIL/uL)   Hemoglobin 12.0  12.0 - 15.0 (g/dL)   HCT 34.4 (*) 36.0 - 46.0 (%)   MCV 98.9  78.0 - 100.0 (fL)   MCH 34.5 (*) 26.0 - 34.0 (pg)   MCHC 34.9  30.0 - 36.0 (g/dL)   RDW 13.4  11.5 - 15.5 (%)   Platelets 179  150 - 400 (K/uL)  DIFFERENTIAL      Component Value Range   Neutrophils Relative 78 (*) 43 - 77 (%)   Neutro Abs 5.1  1.7 - 7.7 (K/uL)   Lymphocytes Relative 14  12 - 46 (%)   Lymphs Abs 0.9  0.7 - 4.0 (K/uL)   Monocytes Relative 8  3 - 12 (%)   Monocytes Absolute 0.5  0.1 - 1.0 (K/uL)   Eosinophils Relative 1  0 - 5 (%)   Eosinophils Absolute 0.1  0.0 - 0.7 (K/uL)   Basophils Relative 0  0 - 1 (%)   Basophils Absolute 0.0  0.0 - 0.1 (K/uL)  BASIC METABOLIC PANEL      Component Value Range   Sodium 133 (*) 135 - 145 (mEq/L)   Potassium 3.4 (*) 3.5 - 5.1 (mEq/L)   Chloride 95 (*) 96 - 112 (mEq/L)   CO2 28  19 - 32 (mEq/L)   Glucose, Bld 86  70 - 99 (mg/dL)   BUN 13  6 - 23 (mg/dL)    Creatinine,  Ser 0.94  0.50 - 1.10 (mg/dL)   Calcium 9.5  8.4 - 10.5 (mg/dL)   GFR calc non Af Amer 57 (*) >60 (mL/min)   GFR calc Af Amer >60  >60 (mL/min)  ETHANOL      Component Value Range   Alcohol, Ethyl (B) <11  0 - 11 (mg/dL)  URINE RAPID DRUG SCREEN (HOSP PERFORMED)      Component Value Range   Opiates NONE DETECTED  NONE DETECTED    Cocaine NONE DETECTED  NONE DETECTED    Benzodiazepines POSITIVE (*) NONE DETECTED    Amphetamines NONE DETECTED  NONE DETECTED    Tetrahydrocannabinol NONE DETECTED  NONE DETECTED    Barbiturates NONE DETECTED  NONE DETECTED   ACETAMINOPHEN LEVEL      Component Value Range   Acetaminophen (Tylenol), Serum <15.0  10 - 30 (ug/mL)  SALICYLATE LEVEL      Component Value Range   Salicylate Lvl 123456 (*) 2.8 - 20.0 (mg/dL)  URINALYSIS, ROUTINE W REFLEX MICROSCOPIC      Component Value Range   Color, Urine YELLOW  YELLOW    Appearance CLEAR  CLEAR    Specific Gravity, Urine <1.005 (*) 1.005 - 1.030    pH 6.5  5.0 - 8.0    Glucose, UA NEGATIVE  NEGATIVE (mg/dL)   Hgb urine dipstick NEGATIVE  NEGATIVE    Bilirubin Urine NEGATIVE  NEGATIVE    Ketones, ur NEGATIVE  NEGATIVE (mg/dL)   Protein, ur NEGATIVE  NEGATIVE (mg/dL)   Urobilinogen, UA 0.2  0.0 - 1.0 (mg/dL)   Nitrite POSITIVE (*) NEGATIVE    Leukocytes, UA SMALL (*) NEGATIVE   URINE MICROSCOPIC-ADD ON      Component Value Range   Squamous Epithelial / LPF RARE  RARE    WBC, UA 21-50  <3 (WBC/hpf)   Bacteria, UA MANY (*) RARE     MDM Depression Suicide attempt    Elmer Picker, MD 01/28/11 [place], MD 01/28/11 [phone]

## 2011-01-22 NOTE — ED Notes (Signed)
Tommy with ACT called and stated faxing paperwork for pending acceptance to [facility].  Pt calm, cooperative.  Sitter at bedside.

## 2011-01-22 NOTE — ED Notes (Signed)
Pt sleeping, sitter at bedside

## 2011-01-22 NOTE — ED Notes (Signed)
Family requesting to know what is going on & to see EDP. Informed family of current information & EDP notified of famill request to speak to him.

## 2011-01-22 NOTE — ED Notes (Signed)
ekg faxed to [facility] in admissions. Stating she would have their dr look at the case & give a reply back.

## 2011-01-22 NOTE — ED Notes (Signed)
Pts. Contact #'s   Cira Servant ( great-great nephew) [phone] (home), [phone] (cell) Also stated he has POA for pt.

## 2011-01-23 NOTE — ED Notes (Addendum)
Kimberly [name] called & advised pt has been accepted at [facility].

## 2011-01-23 NOTE — Progress Notes (Signed)
0023 Assumed care/disposition of patient. Patient is awaiting placement at [facility]. Depression and suidial gesture (overdose). Resting comfortably. Sitter at the bedside. Stuckey that [facility] accepted the patient. They have asked that we provide a Rx for treatment of UTI. Accepting physician Dr. Geanie Kenning. Rx for Cipro 250 mg BID x 5 days provided.

## 2011-01-23 NOTE — ED Notes (Signed)
Pt resting calmly w/ eyes closed. Rise & fall of the chest noted. NAD noted at this time.   

## 2011-01-23 NOTE — ED Notes (Signed)
Pt resting calmly w/ eyes closed. Rise & fall of the chest noted. Sitter remains outside room  Bed in low position, side rails up x2. NAD noted & no needs voiced at this time.

## 2011-01-23 NOTE — ED Notes (Signed)
Voluntary admission read to pt & pt states she understands the information read to her. Pt signed paperwork w/ this nurse present & then form was faxed to [facility] attention [facility].

## 2011-01-23 NOTE — ED Notes (Signed)
Kimberly [name] from [facility] called & advised dr Geanie Kenning was accepting pt. Bed will be ready tomorrow, they have to move a pt in the morning & will call when clean. Paperwork being faxed & pt must read, sign & fax back. Pt needs a script for the cipro to tx the uti sent w/ her.

## 2011-01-23 NOTE — ED Notes (Signed)
Bed now available and ready for patient at [facility].

## 2011-01-23 NOTE — ED Notes (Signed)
[facility] called to transfer patient.

## 2011-01-23 NOTE — ED Notes (Signed)
Pt assisted to restroom at this time.

## 2011-02-05 LAB — PROTIME-INR
INR: 2 — ABNORMAL HIGH
Prothrombin Time: 16.9 — ABNORMAL HIGH
Prothrombin Time: 23.8 — ABNORMAL HIGH

## 2011-02-05 LAB — CBC
HCT: 28.8 — ABNORMAL LOW
Hemoglobin: 9.7 — ABNORMAL LOW
MCHC: 33.7
MCHC: 33.8
MCV: 105.9 — ABNORMAL HIGH
MCV: 106 — ABNORMAL HIGH
Platelets: 219
RBC: 2.82 — ABNORMAL LOW
RDW: 12.2
RDW: 12.3

## 2011-02-05 LAB — BASIC METABOLIC PANEL
BUN: 10
BUN: 12
BUN: 17
BUN: 19
CO2: 25
CO2: 27
CO2: 30
Calcium: 8.3 — ABNORMAL LOW
Calcium: 8.7
Chloride: 102
Chloride: 98
Chloride: 99
Chloride: 99
Creatinine, Ser: 0.83
Creatinine, Ser: 0.87
Creatinine, Ser: 0.92
GFR calc Af Amer: >60
GFR calc non Af Amer: 48 — ABNORMAL LOW
Glucose, Bld: 158 — ABNORMAL HIGH
Glucose, Bld: 77
Glucose, Bld: 78
Glucose, Bld: 93
Potassium: 3.6
Potassium: 4.3
Sodium: 135

## 2011-02-05 LAB — URINE CULTURE
Colony Count: >=100000
Special Requests: NEGATIVE

## 2011-02-05 LAB — URINALYSIS, ROUTINE W REFLEX MICROSCOPIC
Bilirubin Urine: NEGATIVE
Glucose, UA: NEGATIVE
Ketones, ur: NEGATIVE
Specific Gravity, Urine: 1.005
pH: 7

## 2011-11-11 ENCOUNTER — Emergency Department (HOSPITAL_COMMUNITY)
Admission: EM | Admit: 2011-11-11 | Discharge: 2011-11-12 | Disposition: A | Discharge status: Home / Self Care | Payer: Medicare Other | Attending: Emergency Medicine | Admitting: Emergency Medicine

## 2011-11-11 ENCOUNTER — Encounter (HOSPITAL_COMMUNITY): Payer: Self-pay | Admitting: *Deleted

## 2011-11-11 DIAGNOSIS — F329 Major depressive disorder, single episode, unspecified: Secondary | ICD-10-CM | POA: Insufficient documentation

## 2011-11-11 DIAGNOSIS — G8929 Other chronic pain: Secondary | ICD-10-CM | POA: Insufficient documentation

## 2011-11-11 DIAGNOSIS — N898 Other specified noninflammatory disorders of vagina: Secondary | ICD-10-CM | POA: Insufficient documentation

## 2011-11-11 DIAGNOSIS — M4120 Other idiopathic scoliosis, site unspecified: Secondary | ICD-10-CM | POA: Insufficient documentation

## 2011-11-11 DIAGNOSIS — M545 Low back pain, unspecified: Secondary | ICD-10-CM | POA: Insufficient documentation

## 2011-11-11 DIAGNOSIS — M1990 Unspecified osteoarthritis, unspecified site: Secondary | ICD-10-CM | POA: Insufficient documentation

## 2011-11-11 DIAGNOSIS — I4891 Unspecified atrial fibrillation: Secondary | ICD-10-CM | POA: Insufficient documentation

## 2011-11-11 DIAGNOSIS — M79609 Pain in unspecified limb: Secondary | ICD-10-CM | POA: Insufficient documentation

## 2011-11-11 DIAGNOSIS — Z7901 Long term (current) use of anticoagulants: Secondary | ICD-10-CM | POA: Insufficient documentation

## 2011-11-11 DIAGNOSIS — Z79899 Other long term (current) drug therapy: Secondary | ICD-10-CM | POA: Insufficient documentation

## 2011-11-11 DIAGNOSIS — Z950 Presence of cardiac pacemaker: Secondary | ICD-10-CM | POA: Insufficient documentation

## 2011-11-11 DIAGNOSIS — F3289 Other specified depressive episodes: Secondary | ICD-10-CM | POA: Insufficient documentation

## 2011-11-11 DIAGNOSIS — M549 Dorsalgia, unspecified: Secondary | ICD-10-CM

## 2011-11-11 LAB — URINALYSIS, ROUTINE W REFLEX MICROSCOPIC
Bilirubin Urine: NEGATIVE
Hgb urine dipstick: NEGATIVE
Nitrite: NEGATIVE
Protein, ur: NEGATIVE mg/dL
Specific Gravity, Urine: 1.018 (ref 1.005–1.030)

## 2011-11-11 LAB — CBC WITH DIFFERENTIAL/PLATELET
Basophils Absolute: 0 10*3/uL (ref 0.0–0.1)
HCT: 33.2 % — ABNORMAL LOW (ref 36.0–46.0)
Lymphocytes Relative: 24 % (ref 12–46)
Monocytes Absolute: 0.6 10*3/uL (ref 0.1–1.0)
Neutro Abs: 5.6 10*3/uL (ref 1.7–7.7)
Neutrophils Relative %: 68 % (ref 43–77)
Platelets: 232 10*3/uL (ref 150–400)
RDW: 12.9 % (ref 11.5–15.5)
WBC: 8.2 10*3/uL (ref 4.0–10.5)

## 2011-11-11 LAB — URINE MICROSCOPIC-ADD ON

## 2011-11-11 LAB — POCT I-STAT, CHEM 8
HCT: 36 % (ref 36.0–46.0)
Hemoglobin: 12.2 g/dL (ref 12.0–15.0)
Potassium: 4.2 mEq/L (ref 3.5–5.1)
Sodium: 141 mEq/L (ref 135–145)
TCO2: 25 mmol/L (ref 0–100)

## 2011-11-11 LAB — PROTIME-INR
INR: 2.59 — ABNORMAL HIGH (ref 0.00–1.49)
Prothrombin Time: 28.2 seconds — ABNORMAL HIGH (ref 11.6–15.2)

## 2011-11-11 MED ORDER — ACETAMINOPHEN 325 MG PO TABS
650.0000 mg | ORAL_TABLET | Freq: Once | ORAL | Status: AC
  Administered 2011-11-11: 650 mg via ORAL
  Filled 2011-11-11: qty 2

## 2011-11-11 NOTE — ED Notes (Signed)
Per EMS. Pt reports that she has history of scoliosis and chronic back pain. Pt told EMS she has two MD appointments in the next few days and would like to have her medical issues handled today in one visit as she does not like to drive into town on her own.

## 2011-11-11 NOTE — ED Notes (Signed)
Pt situated in the lobby and asked to speak to the Nursing staff.  Pt. Stated that she had information that she forgot to give to  the triage Nurse.  Pt. Stated that she has had vaginal discharge and foul vaginal odor.  Pt. Was told that the information would be posted to her chart.

## 2011-11-11 NOTE — ED Provider Notes (Signed)
History     CSN: [id number]  Arrival date & time 11/11/11  1732   First MD Initiated Contact with Patient 11/11/11 2104      Chief Complaint  Patient presents with  . Back Pain  . Fall    (Consider location/radiation/quality/duration/timing/severity/associated sxs/prior treatment) HPI Comments: Patient reports increased urinary leaking and + odor, an increase in her normal back radiating to the R thigh.  Her blood pressure has been elevated for a "while" but has not been placed back on medications for control. She has a generalized headache for several days   She has an appointment with her PCP tomorrow   Patient is a 78 y.o. female presenting with back pain and fall. The history is provided by the patient.  Back Pain  This is a chronic problem. The problem occurs constantly. The problem has been gradually worsening. The pain is associated with no known injury. The pain is present in the lumbar spine. The pain radiates to the right thigh. The pain is at a severity of 4/10. The pain is mild. Pertinent negatives include no fever, no numbness and no dysuria.  Fall Pertinent negatives include no fever and no numbness.    Past Medical History  Diagnosis Date  . Depression   . Atrial fibrillation   . Scoliosis   . Osteoarthritis   . Pacemaker     Past Surgical History  Procedure Date  . Pacemaker insertion   . Hernia repair   . Abdominal hysterectomy   . Cholecystectomy   . Appendectomy     No family history on file.  History  Substance Use Topics  . Smoking status: Never Smoker   . Smokeless tobacco: Not on file  . Alcohol Use: No    OB History    Grav Para Term Preterm Abortions TAB SAB Ect Mult Living                  Review of Systems  Constitutional: Negative for fever.  Genitourinary: Negative for dysuria.  Musculoskeletal: Positive for back pain.  Neurological: Negative for numbness.    Allergies  Iodine; Papaya derivatives; Penicillins; and Sulfa  antibiotics  Home Medications   Current Outpatient Rx  Name Route Sig Dispense Refill  . CARVEDILOL 6.25 MG PO TABS Oral Take 6.25 mg by mouth 2 (two) times daily with a meal.      . FUROSEMIDE 40 MG PO TABS Oral Take 40 mg by mouth daily.      [name] HYDROCODONE-ACETAMINOPHEN 5-500 MG PO TABS Oral Take 1 tablet by mouth every 6 (six) hours as needed.     . IBUPROFEN-DIPHENHYDRAMINE CIT 200-38 MG PO TABS Oral Take 1 tablet by mouth at bedtime as needed. Sleep aid     . PAROXETINE HCL 40 MG PO TABS Oral Take 40 mg by mouth every morning.      [name] PREDNISONE 10 MG PO TABS Oral Take 10 mg by mouth every 6 (six) hours.     [name] TEMAZEPAM 30 MG PO CAPS Oral Take 30 mg by mouth at bedtime. Sleep aid    . WARFARIN SODIUM 3 MG PO TABS Oral Take by mouth daily.       BP 140/64  Pulse 71  Temp 98.4 F (36.9 C) (Oral)  Resp 17  Wt 140 lb 3.2 oz (63.594 kg)  SpO2 99%  Physical Exam  Constitutional: She is oriented to person, place, and time. She appears well-developed.  HENT:  Head: Normocephalic.  Eyes: Pupils  are equal, round, and reactive to light.  Cardiovascular: Normal rate.   Pulmonary/Chest: Effort normal.  Abdominal: Soft.  Musculoskeletal:       Scoliosis without redness, abscess, rash   Neurological: She is alert and oriented to person, place, and time.  Skin: Skin is warm. No rash noted. No erythema.    ED Course  Procedures (including critical care time)  Labs Reviewed  URINALYSIS, ROUTINE W REFLEX MICROSCOPIC - Abnormal; Notable for the following:    Leukocytes, UA MODERATE (*)     All other components within normal limits  CBC WITH DIFFERENTIAL - Abnormal; Notable for the following:    RBC 3.23 (*)     Hemoglobin 11.2 (*)     HCT 33.2 (*)     MCV 102.8 (*)     MCH 34.7 (*)     All other components within normal limits  PROTIME-INR - Abnormal; Notable for the following:    Prothrombin Time 28.2 (*)     INR 2.59 (*)     All other components within normal limits  POCT  I-STAT, CHEM 8 - Abnormal; Notable for the following:    BUN 29 (*)     Creatinine, Ser 1.30 (*)     Calcium, Ion 1.11 (*)     All other components within normal limits  URINE MICROSCOPIC-ADD ON   No results found.   1. Back pain, chronic       MDM  excerebration of chronic back pain Negative UA   Patient was fitted with a lumbar brace which is helping her discomfort She will follow up with her PCP as scheduled this week         Garald Balding, NP 11/12/11 0011  Garald Balding, NP 11/12/11 0011

## 2011-11-11 NOTE — ED Notes (Addendum)
Pt states "I have scoliosis, I've had back pain for a long, long time, I'm going to spend the night, this pain, I have a bad h/a, I need someone to put these gliders on my walker"

## 2011-11-12 NOTE — ED Provider Notes (Signed)
Medical screening examination/treatment/procedure(s) were conducted as a shared visit with non-physician practitioner(s) and myself.  I personally evaluated the patient during the encounter  Acute on chronic back pain. No neurological deficits. Ankle plantar/dorsiflexion intact, +2 DP and PT pulses, great toe dorsiflexion intact, +2 patellar reflexes.  Ezequiel Essex, MD 11/12/11 [phone]

## 2011-11-21 ENCOUNTER — Emergency Department (HOSPITAL_COMMUNITY)
Admission: EM | Admit: 2011-11-21 | Discharge: 2011-11-21 | Disposition: A | Discharge status: Home / Self Care | Payer: Medicare Other | Attending: Emergency Medicine | Admitting: Emergency Medicine

## 2011-11-21 ENCOUNTER — Encounter (HOSPITAL_COMMUNITY): Payer: Self-pay

## 2011-11-21 DIAGNOSIS — T148 Other injury of unspecified body region: Secondary | ICD-10-CM | POA: Insufficient documentation

## 2011-11-21 DIAGNOSIS — M4120 Other idiopathic scoliosis, site unspecified: Secondary | ICD-10-CM | POA: Insufficient documentation

## 2011-11-21 DIAGNOSIS — N390 Urinary tract infection, site not specified: Secondary | ICD-10-CM

## 2011-11-21 DIAGNOSIS — M1990 Unspecified osteoarthritis, unspecified site: Secondary | ICD-10-CM | POA: Insufficient documentation

## 2011-11-21 DIAGNOSIS — W57XXXA Bitten or stung by nonvenomous insect and other nonvenomous arthropods, initial encounter: Secondary | ICD-10-CM | POA: Insufficient documentation

## 2011-11-21 DIAGNOSIS — Z7901 Long term (current) use of anticoagulants: Secondary | ICD-10-CM | POA: Insufficient documentation

## 2011-11-21 DIAGNOSIS — I4891 Unspecified atrial fibrillation: Secondary | ICD-10-CM | POA: Insufficient documentation

## 2011-11-21 DIAGNOSIS — Z950 Presence of cardiac pacemaker: Secondary | ICD-10-CM | POA: Insufficient documentation

## 2011-11-21 LAB — URINALYSIS, ROUTINE W REFLEX MICROSCOPIC
Bilirubin Urine: NEGATIVE
Ketones, ur: NEGATIVE mg/dL
Nitrite: NEGATIVE
Urobilinogen, UA: 0.2 mg/dL (ref 0.0–1.0)

## 2011-11-21 MED ORDER — LEVOFLOXACIN 500 MG PO TABS
500.0000 mg | ORAL_TABLET | Freq: Every day | ORAL | Status: DC
  Administered 2011-11-21: 500 mg via ORAL
  Filled 2011-11-21: qty 1

## 2011-11-21 MED ORDER — PREDNISONE 20 MG PO TABS: ORAL_TABLET | ORAL | Status: DC

## 2011-11-21 MED ORDER — FAMOTIDINE 20 MG PO TABS: 20.0000 mg | ORAL_TABLET | Freq: Two times a day (BID) | ORAL | Status: DC

## 2011-11-21 MED ORDER — LEVOFLOXACIN 500 MG PO TABS: 500.0000 mg | ORAL_TABLET | Freq: Every day | ORAL | Status: DC

## 2011-11-21 MED ORDER — FAMOTIDINE 20 MG PO TABS
20.0000 mg | ORAL_TABLET | Freq: Once | ORAL | Status: AC
  Administered 2011-11-21: 20 mg via ORAL
  Filled 2011-11-21: qty 1

## 2011-11-21 NOTE — ED Provider Notes (Addendum)
History  This chart was scribed for Kimberly Norrie, MD by Kevan Rosebush. This patient was seen in room TR06C/TR06C and the patient's care was started at 17:18.   CSN: [id number]  Arrival date & time 11/21/11  1452   First MD Initiated Contact with Patient 11/21/11 1718      Chief Complaint  Patient presents with  . Allergic Reaction    (Consider location/radiation/quality/duration/timing/severity/associated sxs/prior treatment) HPI  Kimberly [name] is a 78 y.o. female who presents to the Emergency Department complaining of a reaction to insect bites on her legs and arms bilaterally. Pt reports she first noticed the itching bumps on the 4th of July (a few weeks ago), but they are gradually worsening. Pt reports she usually attracts a lot of insects.  She reports a lot of itching, no pain. Pt reports trying many treatments, including taking benadryl tablets with no relief from symptoms. Pt also reports her tongue is swollen from biting the side of it. Pt reports she has scoliosis.   Pt reports frequency without dysuria.   PA [facility] Pretty under Dr. Jacelyn Grip is her PCP.     Past Medical History  Diagnosis Date  . Depression   . Atrial fibrillation   . Scoliosis   . Osteoarthritis   . Pacemaker     Past Surgical History  Procedure Date  . Pacemaker insertion   . Hernia repair   . Abdominal hysterectomy   . Cholecystectomy   . Appendectomy     No family history on file.  History  Substance Use Topics  . Smoking status: Never Smoker   . Smokeless tobacco: Not on file  . Alcohol Use: No  lives alone  OB History    Grav Para Term Preterm Abortions TAB SAB Ect Mult Living                 Pt is married but has been having difficulties with the health care of her husband.   Review of Systems  Constitutional: Positive for appetite change. Negative for fever and chills.  Gastrointestinal: Negative for nausea and vomiting.  Skin:       Insect bites on legs and arms    Neurological: Negative for weakness.    Allergies  Iodine; Papaya derivatives; Penicillins; and Sulfa antibiotics  Home Medications   Current Outpatient Rx  Name Route Sig Dispense Refill  . CARVEDILOL 6.25 MG PO TABS Oral Take 6.25 mg by mouth 2 (two) times daily with a meal.      . FUROSEMIDE 40 MG PO TABS Oral Take 40 mg by mouth daily.      [name] HYDROCODONE-ACETAMINOPHEN 5-500 MG PO TABS Oral Take 1 tablet by mouth every 6 (six) hours as needed. pain    . IBUPROFEN-DIPHENHYDRAMINE CIT 200-38 MG PO TABS Oral Take 1 tablet by mouth at bedtime as needed. Sleep aid     . PAROXETINE HCL 40 MG PO TABS Oral Take 40 mg by mouth every morning.      [name] TEMAZEPAM 30 MG PO CAPS Oral Take 30 mg by mouth at bedtime. Sleep aid    . WARFARIN SODIUM 3 MG PO TABS Oral Take 3 mg by mouth daily.       BP 142/65  Pulse 87  Temp 98.6 F (37 C) (Oral)  Resp 20  SpO2 20%  Vital signs normal    Physical Exam  Nursing note and vitals reviewed. Constitutional: She is oriented to person, place, and time. She appears  well-developed and well-nourished. No distress.  HENT:  Head: Normocephalic and atraumatic.  Right Ear: External ear normal.  Left Ear: External ear normal.  Mouth/Throat: Oropharynx is clear and moist.       Pt has been biting the left side of her tongue, no open wounds  Eyes: EOM are normal.  Neck: Neck supple. No tracheal deviation present.  Cardiovascular: Normal rate.   Pulmonary/Chest: Effort normal. No respiratory distress.  Musculoskeletal: Normal range of motion.       Scattered red areas about .5 cm in size on legs bilaterally with no induration. About 4-5 on each leg  Neurological: She is alert and oriented to person, place, and time.  Skin: Skin is warm and dry.  Psychiatric: She has a normal mood and affect. Her behavior is normal.    ED Course  Procedures (including critical care time)  Medications  predniSONE (DELTASONE) 20 MG tablet (not administered)   famotidine (PEPCID) 20 MG tablet (not administered)  levofloxacin (LEVAQUIN) tablet 500 mg (not administered)  famotidine (PEPCID) tablet 20 mg (20 mg Oral Given 11/21/11 1827)     DIAGNOSTIC STUDIES:   COORDINATION OF CARE: 18:05--I discussed treatment plan including pepcid and low-dose steriods with pt and pt agreed. I informed the pt that we would be discharging her soon.   18:30--Medication order: famotidine (Pepcid) tablet 20 mg--once  18:42--I rechecked the pt and notified her that she has a UTI. I let her know that we would put her on an antibiotic and informed her that antibiotics can interact with her Coumadin but that she should continue taking it. I instructed her to have her Coumadin checked Monday.  Results for orders placed during the hospital encounter of 11/21/11  URINALYSIS, ROUTINE W REFLEX MICROSCOPIC      Component Value Range   Color, Urine YELLOW  YELLOW   APPearance CLOUDY (*) CLEAR   Specific Gravity, Urine 1.010  1.005 - 1.030   pH 6.5  5.0 - 8.0   Glucose, UA NEGATIVE  NEGATIVE mg/dL   Hgb urine dipstick TRACE (*) NEGATIVE   Bilirubin Urine NEGATIVE  NEGATIVE   Ketones, ur NEGATIVE  NEGATIVE mg/dL   Protein, ur NEGATIVE  NEGATIVE mg/dL   Urobilinogen, UA 0.2  0.0 - 1.0 mg/dL   Nitrite NEGATIVE  NEGATIVE   Leukocytes, UA LARGE (*) NEGATIVE  URINE MICROSCOPIC-ADD ON      Component Value Range   Squamous Epithelial / LPF FEW (*) RARE   WBC, UA TOO NUMEROUS TO COUNT  <3 WBC/hpf   RBC / HPF 0-2  <3 RBC/hpf   Bacteria, UA MANY (*) RARE   Laboratory interpretation all normal except UTI   1. Insect bites   2. Urinary tract infection    New Prescriptions   FAMOTIDINE (PEPCID) 20 MG TABLET    Take 1 tablet (20 mg total) by mouth 2 (two) times daily.   LEVOFLOXACIN (LEVAQUIN) 500 MG TABLET    Take 1 tablet (500 mg total) by mouth daily.   PREDNISONE (DELTASONE) 20 MG TABLET    Take  2 po QD x 3d then 1 po QD x 3d    Plan discharge  Rolland Porter, MD,  FACEP    MDM   I personally performed the services described in this documentation, which was scribed in my presence. The recorded information has been reviewed and considered.  Rolland Porter, MD, FACEP          Kimberly Norrie, MD 11/21/11 Valerie Roys  Daleen Bo  Harmon Dun, MD 11/21/11 [phone]

## 2011-11-21 NOTE — ED Notes (Signed)
sts tongue swelling and allergy to some sort of insect, sts ongoing for the summer, seen for same at [facility] for same at 07/07, sts sts taken benadryl no swelling noted by this rn.

## 2011-11-25 LAB — URINE CULTURE

## 2011-11-26 NOTE — ED Notes (Signed)
+   Urine Patient treated with Levofloxacin-sensitive to same-chart appended per protocol MD.

## 2011-11-27 ENCOUNTER — Emergency Department (HOSPITAL_COMMUNITY): Payer: Medicare Other

## 2011-11-27 ENCOUNTER — Encounter (HOSPITAL_COMMUNITY): Payer: Self-pay | Admitting: *Deleted

## 2011-11-27 ENCOUNTER — Emergency Department (HOSPITAL_COMMUNITY)
Admission: EM | Admit: 2011-11-27 | Discharge: 2011-11-27 | Disposition: A | Discharge status: Home / Self Care | Payer: Medicare Other | Attending: Emergency Medicine | Admitting: Emergency Medicine

## 2011-11-27 DIAGNOSIS — F419 Anxiety disorder, unspecified: Secondary | ICD-10-CM

## 2011-11-27 DIAGNOSIS — Z79899 Other long term (current) drug therapy: Secondary | ICD-10-CM | POA: Insufficient documentation

## 2011-11-27 DIAGNOSIS — F29 Unspecified psychosis not due to a substance or known physiological condition: Secondary | ICD-10-CM | POA: Insufficient documentation

## 2011-11-27 DIAGNOSIS — R0602 Shortness of breath: Secondary | ICD-10-CM | POA: Insufficient documentation

## 2011-11-27 DIAGNOSIS — F411 Generalized anxiety disorder: Secondary | ICD-10-CM | POA: Insufficient documentation

## 2011-11-27 DIAGNOSIS — Z9089 Acquired absence of other organs: Secondary | ICD-10-CM | POA: Insufficient documentation

## 2011-11-27 DIAGNOSIS — F329 Major depressive disorder, single episode, unspecified: Secondary | ICD-10-CM | POA: Insufficient documentation

## 2011-11-27 DIAGNOSIS — F3289 Other specified depressive episodes: Secondary | ICD-10-CM | POA: Insufficient documentation

## 2011-11-27 DIAGNOSIS — E876 Hypokalemia: Secondary | ICD-10-CM | POA: Insufficient documentation

## 2011-11-27 DIAGNOSIS — I4891 Unspecified atrial fibrillation: Secondary | ICD-10-CM | POA: Insufficient documentation

## 2011-11-27 LAB — CBC WITH DIFFERENTIAL/PLATELET
HCT: 31.7 % — ABNORMAL LOW (ref 36.0–46.0)
Hemoglobin: 11.2 g/dL — ABNORMAL LOW (ref 12.0–15.0)
Lymphocytes Relative: 15 % (ref 12–46)
Monocytes Absolute: 0.8 10*3/uL (ref 0.1–1.0)
Monocytes Relative: 10 % (ref 3–12)
Neutro Abs: 6.2 10*3/uL (ref 1.7–7.7)
WBC: 8.4 10*3/uL (ref 4.0–10.5)

## 2011-11-27 LAB — CARDIAC PANEL(CRET KIN+CKTOT+MB+TROPI)
CK, MB: 3.3 ng/mL (ref 0.3–4.0)
Total CK: 90 U/L (ref 7–177)

## 2011-11-27 LAB — COMPREHENSIVE METABOLIC PANEL
BUN: 26 mg/dL — ABNORMAL HIGH (ref 6–23)
CO2: 29 mEq/L (ref 19–32)
Chloride: 99 mEq/L (ref 96–112)
Creatinine, Ser: 1.43 mg/dL — ABNORMAL HIGH (ref 0.50–1.10)
GFR calc non Af Amer: 33 mL/min — ABNORMAL LOW (ref >90)
Total Bilirubin: 0.3 mg/dL (ref 0.3–1.2)

## 2011-11-27 LAB — URINALYSIS, ROUTINE W REFLEX MICROSCOPIC
Bilirubin Urine: NEGATIVE
Glucose, UA: NEGATIVE mg/dL
Ketones, ur: NEGATIVE mg/dL
pH: 6 (ref 5.0–8.0)

## 2011-11-27 LAB — RAPID URINE DRUG SCREEN, HOSP PERFORMED
Cocaine: NOT DETECTED
Opiates: POSITIVE — AB
Tetrahydrocannabinol: NOT DETECTED

## 2011-11-27 LAB — PROTIME-INR: INR: 1.24 (ref 0.00–1.49)

## 2011-11-27 LAB — URINE MICROSCOPIC-ADD ON

## 2011-11-27 MED ORDER — LEVOFLOXACIN 500 MG PO TABS
500.0000 mg | ORAL_TABLET | Freq: Once | ORAL | Status: AC
  Administered 2011-11-27: 500 mg via ORAL
  Filled 2011-11-27: qty 1

## 2011-11-27 MED ORDER — TEMAZEPAM 30 MG PO CAPS: 30.0000 mg | ORAL_CAPSULE | Freq: Every evening | ORAL | Status: DC | PRN

## 2011-11-27 MED ORDER — ARIPIPRAZOLE 2 MG PO TABS: 2.0000 mg | ORAL_TABLET | Freq: Every day | ORAL | Status: DC

## 2011-11-27 MED ORDER — ESCITALOPRAM OXALATE 10 MG PO TABS: 10.0000 mg | ORAL_TABLET | Freq: Every day | ORAL | Status: DC

## 2011-11-27 MED ORDER — POTASSIUM CHLORIDE ER 10 MEQ PO TBCR: 10.0000 meq | EXTENDED_RELEASE_TABLET | Freq: Two times a day (BID) | ORAL | Status: DC

## 2011-11-27 MED ORDER — CEPHALEXIN 250 MG PO CAPS: 500.0000 mg | ORAL_CAPSULE | Freq: Once | ORAL | Status: DC

## 2011-11-27 MED ORDER — POTASSIUM CHLORIDE CRYS ER 20 MEQ PO TBCR
40.0000 meq | EXTENDED_RELEASE_TABLET | Freq: Once | ORAL | Status: AC
  Administered 2011-11-27: 40 meq via ORAL
  Filled 2011-11-27: qty 1

## 2011-11-27 NOTE — ED Notes (Signed)
Patient very tearful at times.  Will talk about bad things that happened years ago.  No SOB noted while conversating.

## 2011-11-27 NOTE — ED Notes (Signed)
Pt taken to xray 

## 2011-11-27 NOTE — ED Provider Notes (Signed)
Medical screening examination/treatment/procedure(s) were conducted as a shared visit with non-physician practitioner(s) and myself.  I personally evaluated the patient during the encounter   Kimberly Essex, MD 11/27/11 1640

## 2011-11-27 NOTE — ED Notes (Signed)
Patient continue to be evaluated via telepsych.

## 2011-11-27 NOTE — ED Notes (Signed)
Tele psych at bedside.  Physician to make evaluation.

## 2011-11-27 NOTE — ED Provider Notes (Signed)
History     CSN: [id number]  Arrival date & time 11/27/11  0621   First MD Initiated Contact with Patient 11/27/11 [phone]      Chief Complaint  Patient presents with  . Shortness of Breath    (Consider location/radiation/quality/duration/timing/severity/associated sxs/prior treatment) HPI Comments: Patient presents via EMS c/o SOB and panic attack.  No history of same but states has had depression and anxiety. Patient states she was talking with her niece and became anxious and tearful over the topic of her husband. Her husband was "taken away from her" one year ago by social services. She felt panicky with shortness of breath.  She knows it was a panic attack because "I've seen them before in other people". She denies any chest pain, nausea, vomiting, sweating. She called EMS herself because she felt short of breath. He feeling better on arrival. Anxious and tearful. She denies any suicidal ideation. She expresses vague homicidal ideation towards her daughter. She denies any hallucinations. She takes Paxil but has not seen a psychiatrist for several years.  The history is provided by the patient and the EMS personnel.    Past Medical History  Diagnosis Date  . Depression   . Atrial fibrillation   . Scoliosis   . Osteoarthritis   . Pacemaker     Past Surgical History  Procedure Date  . Pacemaker insertion   . Hernia repair   . Abdominal hysterectomy   . Cholecystectomy   . Appendectomy     History reviewed. No pertinent family history.  History  Substance Use Topics  . Smoking status: Never Smoker   . Smokeless tobacco: Not on file  . Alcohol Use: No    OB History    Grav Para Term Preterm Abortions TAB SAB Ect Mult Living                  Review of Systems  Constitutional: Negative for fever, activity change and appetite change.  HENT: Negative for congestion and rhinorrhea.   Respiratory: Positive for shortness of breath. Negative for chest tightness.     Cardiovascular: Negative for chest pain.  Gastrointestinal: Negative for nausea, vomiting and abdominal pain.  Genitourinary: Negative for dysuria and hematuria.  Musculoskeletal: Negative for back pain.  Psychiatric/Behavioral: Positive for dysphoric mood, decreased concentration and agitation. Negative for hallucinations. The patient is nervous/anxious.     Allergies  Iodine; Papaya derivatives; Penicillins; and Sulfa antibiotics  Home Medications   Current Outpatient Rx  Name Route Sig Dispense Refill  . CARVEDILOL 6.25 MG PO TABS Oral Take 6.25 mg by mouth 2 (two) times daily with a meal.      . FUROSEMIDE 40 MG PO TABS Oral Take 40 mg by mouth daily.      [name] HYDROCODONE-ACETAMINOPHEN 5-500 MG PO TABS Oral Take 1 tablet by mouth every 6 (six) hours as needed. pain    . TEMAZEPAM 30 MG PO CAPS Oral Take 30 mg by mouth at bedtime. Sleep aid    . WARFARIN SODIUM 3 MG PO TABS Oral Take 3 mg by mouth daily.     . ARIPIPRAZOLE 2 MG PO TABS Oral Take 1 tablet (2 mg total) by mouth daily. 30 tablet 0  . ESCITALOPRAM OXALATE 10 MG PO TABS Oral Take 1 tablet (10 mg total) by mouth daily. 30 tablet 0  . POTASSIUM CHLORIDE ER 10 MEQ PO TBCR Oral Take 1 tablet (10 mEq total) by mouth 2 (two) times daily. 10 tablet 0  .  TEMAZEPAM 30 MG PO CAPS Oral Take 1 capsule (30 mg total) by mouth at bedtime as needed for sleep. 30 capsule 0    BP 155/61  Pulse 71  Temp 98.9 F (37.2 C) (Oral)  Resp 16  SpO2 100%  Physical Exam  Constitutional: She is oriented to person, place, and time. She appears well-developed and well-nourished. No distress.       Anxious and tearful.  HENT:  Head: Normocephalic and atraumatic.  Mouth/Throat: Oropharynx is clear and moist. No oropharyngeal exudate.  Eyes: Conjunctivae and EOM are normal. Pupils are equal, round, and reactive to light.  Neck: Normal range of motion. Neck supple.  Cardiovascular: Normal rate, regular rhythm and normal heart sounds.   No murmur  heard. Pulmonary/Chest: Effort normal and breath sounds normal. No respiratory distress.  Abdominal: Soft. There is no tenderness. There is no rebound and no guarding.  Musculoskeletal: Normal range of motion. She exhibits no edema and no tenderness.  Neurological: She is alert and oriented to person, place, and time. No cranial nerve deficit.  Skin: Skin is warm.    ED Course  Procedures (including critical care time)  Labs Reviewed  CBC WITH DIFFERENTIAL - Abnormal; Notable for the following:    RBC 3.12 (*)     Hemoglobin 11.2 (*)     HCT 31.7 (*)     MCV 101.6 (*)     MCH 35.9 (*)     All other components within normal limits  COMPREHENSIVE METABOLIC PANEL - Abnormal; Notable for the following:    Potassium 2.8 (*)     Glucose, Bld 102 (*)     BUN 26 (*)     Creatinine, Ser 1.43 (*)     GFR calc non Af Amer 33 (*)     GFR calc Af Amer 38 (*)     All other components within normal limits  PRO B NATRIURETIC PEPTIDE - Abnormal; Notable for the following:    Pro B Natriuretic peptide (BNP) 1500.0 (*)     All other components within normal limits  URINALYSIS, ROUTINE W REFLEX MICROSCOPIC - Abnormal; Notable for the following:    APPearance CLOUDY (*)     Hgb urine dipstick SMALL (*)     Leukocytes, UA LARGE (*)     All other components within normal limits  PROTIME-INR - Abnormal; Notable for the following:    Prothrombin Time 15.9 (*)     All other components within normal limits  URINE RAPID DRUG SCREEN (HOSP PERFORMED) - Abnormal; Notable for the following:    Opiates POSITIVE (*)     Benzodiazepines POSITIVE (*)     All other components within normal limits  URINE MICROSCOPIC-ADD ON - Abnormal; Notable for the following:    Squamous Epithelial / LPF FEW (*)     Bacteria, UA FEW (*)     All other components within normal limits  POTASSIUM - Abnormal; Notable for the following:    Potassium 3.3 (*)  HEMOLYSIS AT THIS LEVEL MAY AFFECT RESULT   All other components  within normal limits  CARDIAC PANEL(CRET KIN+CKTOT+MB+TROPI)  ETHANOL  URINE CULTURE   Dg Chest 2 View  11/27/2011  *RADIOLOGY REPORT*  Clinical Data: Short of breath, weakness  CHEST - 2 VIEW  Comparison: Chest radiograph 01/22/2011  Findings: Left-sided pacemaker with three continuous leads overlies the heart silhouette.  Lungs are hyperinflated.  No effusion, infiltrate, or pneumothorax.  IMPRESSION:  1. No acute cardiopulmonary process. 2.  Hyperinflated  lungs.  Original Report Authenticated By: Suzy Bouchard, M.D.   Ct Head Wo Contrast  11/27/2011  *RADIOLOGY REPORT*  Clinical Data: Confusion, crying, posterior, extremity weakness  CT HEAD WITHOUT CONTRAST  Technique:  Contiguous axial images were obtained from the base of the skull through the vertex without contrast.  Comparison: Head CT 03/20/2009  Findings: No acute intracranial hemorrhage.  No focal mass lesion. No CT evidence of acute infarction.   No midline shift or mass effect.  No hydrocephalus.  Basilar cisterns are patent. Paranasal sinuses and mastoid air cells are clear.  Orbits are normal. Mild cortical atrophy.  IMPRESSION: No acute intracranial findings.  Original Report Authenticated By: Suzy Bouchard, M.D.     1. Hypokalemia   2. Anxiety       MDM  Shortness of breath now resolved. Patient attributes to panic attack. No history of same. No chest pain, nausea, vomiting, diaphoresis. EKG nonischemic. Remains tearful anxious.  Suspect anxiety as etiology of SOB.  No diaphoresis, vomiting, nausea, chest pain, abdominal pain.  Symptoms now resolved but still anxious and tearful.  Screening labs, EKG, CXR, CT head given lack of psych history, psychiatry consult.  Hypokalemia noted. UTI noted. Patient currently on Levaquin and has been since July 19. Culture grew Escherichia coli that is pan sensitive.  Psychiatry consult complete. Patient is not meet inpatient criteria. They recommend discontinuing her Paxil,  starting Lexapro, Abilify Restoril. Potassium replaced.  Continue levaquin for UTI.   Date: 11/27/2011  Rate: 70  Rhythm: paced  QRS Axis: normal  Intervals: normal  ST/T Wave abnormalities: normal  Conduction Disutrbances:none  Narrative Interpretation:   Old EKG Reviewed: unchanged    Ezequiel Essex, MD 11/27/11 1640

## 2011-11-27 NOTE — ED Notes (Signed)
Patient ask nurse to call her Doristine Devoid Niece Langley Gauss at [phone] and advise her that the patient is here at [facility].

## 2011-11-27 NOTE — ED Notes (Signed)
Patient presents via EMS original c/o SOB but became very rude and started crying.  Laughing upon arrival to the ED.  Crying stating she lost her husband about 1 year ago,

## 2011-11-27 NOTE — ED Notes (Signed)
[facility] called to transport patient home 

## 2011-11-27 NOTE — ED Provider Notes (Signed)
Pt's K improved with administration of PO potassium. Per telepsych, pt will be discharged with lexapro, abilify, and restoril - instructed to dc paxil. Small rx for additional K supplementation given - likely low 2/2 lasix use. Instructed to f/u with PCP to have K rechecked. Reasons to return discussed.  Abran Richard, PA-C 11/27/11 1607

## 2011-11-27 NOTE — ED Notes (Signed)
Telepsych completed.  Awaiting further orders.  Patient remains cooperative at present.  Tearful at times.

## 2011-11-29 LAB — URINE CULTURE

## 2011-11-30 NOTE — ED Notes (Signed)
Results received from [facility]. (+) URNC -> >/= 100,000 colonies Enterococcus Species.  No abx Rx.  Chart to MD office for review.

## 2011-12-02 ENCOUNTER — Emergency Department (HOSPITAL_COMMUNITY)
Admission: EM | Admit: 2011-12-02 | Discharge: 2011-12-03 | Disposition: A | Discharge status: Home / Self Care | Payer: Medicare Other | Attending: Emergency Medicine | Admitting: Emergency Medicine

## 2011-12-02 ENCOUNTER — Encounter (HOSPITAL_COMMUNITY): Payer: Self-pay | Admitting: *Deleted

## 2011-12-02 ENCOUNTER — Other Ambulatory Visit: Payer: Self-pay

## 2011-12-02 ENCOUNTER — Emergency Department (HOSPITAL_COMMUNITY): Payer: Medicare Other

## 2011-12-02 DIAGNOSIS — Z9089 Acquired absence of other organs: Secondary | ICD-10-CM | POA: Insufficient documentation

## 2011-12-02 DIAGNOSIS — Z79899 Other long term (current) drug therapy: Secondary | ICD-10-CM | POA: Insufficient documentation

## 2011-12-02 DIAGNOSIS — M7989 Other specified soft tissue disorders: Secondary | ICD-10-CM | POA: Insufficient documentation

## 2011-12-02 DIAGNOSIS — F329 Major depressive disorder, single episode, unspecified: Secondary | ICD-10-CM | POA: Insufficient documentation

## 2011-12-02 DIAGNOSIS — F3289 Other specified depressive episodes: Secondary | ICD-10-CM | POA: Insufficient documentation

## 2011-12-02 DIAGNOSIS — R609 Edema, unspecified: Secondary | ICD-10-CM | POA: Insufficient documentation

## 2011-12-02 DIAGNOSIS — I4891 Unspecified atrial fibrillation: Secondary | ICD-10-CM | POA: Insufficient documentation

## 2011-12-02 LAB — POCT I-STAT, CHEM 8
BUN: 21 mg/dL (ref 6–23)
Chloride: 103 mEq/L (ref 96–112)
Creatinine, Ser: 1.3 mg/dL — ABNORMAL HIGH (ref 0.50–1.10)
Potassium: 3.4 mEq/L — ABNORMAL LOW (ref 3.5–5.1)
Sodium: 139 mEq/L (ref 135–145)
TCO2: 25 mmol/L (ref 0–100)

## 2011-12-02 LAB — URINALYSIS, ROUTINE W REFLEX MICROSCOPIC
Bilirubin Urine: NEGATIVE
Glucose, UA: NEGATIVE mg/dL
Hgb urine dipstick: NEGATIVE
Ketones, ur: NEGATIVE mg/dL
Leukocytes, UA: NEGATIVE
Nitrite: NEGATIVE
Protein, ur: NEGATIVE mg/dL
Specific Gravity, Urine: 1.008 (ref 1.005–1.030)
Urobilinogen, UA: 0.2 mg/dL (ref 0.0–1.0)
pH: 7.5 (ref 5.0–8.0)

## 2011-12-02 LAB — CBC WITH DIFFERENTIAL/PLATELET
Basophils Relative: 0 % (ref 0–1)
Eosinophils Absolute: 0.2 10*3/uL (ref 0.0–0.7)
Hemoglobin: 12.2 g/dL (ref 12.0–15.0)
MCH: 35.7 pg — ABNORMAL HIGH (ref 26.0–34.0)
MCHC: 34.4 g/dL (ref 30.0–36.0)
Monocytes Absolute: 0.6 10*3/uL (ref 0.1–1.0)
Monocytes Relative: 10 % (ref 3–12)
Neutrophils Relative %: 62 % (ref 43–77)

## 2011-12-02 LAB — PROTIME-INR: Prothrombin Time: 16.3 seconds — ABNORMAL HIGH (ref 11.6–15.2)

## 2011-12-02 MED ORDER — POTASSIUM CHLORIDE CRYS ER 20 MEQ PO TBCR
40.0000 meq | EXTENDED_RELEASE_TABLET | Freq: Once | ORAL | Status: AC
  Administered 2011-12-02: 40 meq via ORAL
  Filled 2011-12-02: qty 2

## 2011-12-02 MED ORDER — POTASSIUM CHLORIDE CRYS ER 20 MEQ PO TBCR: 40.0000 meq | EXTENDED_RELEASE_TABLET | Freq: Two times a day (BID) | ORAL | Status: DC

## 2011-12-02 MED ORDER — ZOLPIDEM TARTRATE 5 MG PO TABS
10.0000 mg | ORAL_TABLET | Freq: Every evening | ORAL | Status: DC | PRN
  Filled 2011-12-02: qty 2

## 2011-12-02 MED ORDER — POTASSIUM CHLORIDE CRYS ER 20 MEQ PO TBCR: 20.0000 meq | EXTENDED_RELEASE_TABLET | Freq: Two times a day (BID) | ORAL | Status: DC

## 2011-12-02 MED ORDER — FUROSEMIDE 10 MG/ML IJ SOLN
40.0000 mg | Freq: Once | INTRAMUSCULAR | Status: AC
  Administered 2011-12-02: 40 mg via INTRAVENOUS
  Filled 2011-12-02: qty 4

## 2011-12-02 NOTE — ED Notes (Signed)
Pt is here with bilateral  LE swelling for over one week.  NO shortness of breath

## 2011-12-02 NOTE — ED Notes (Signed)
Attempt made to contact patient via phone : no answer.

## 2011-12-02 NOTE — ED Notes (Signed)
Pt given warm blanket.

## 2011-12-02 NOTE — ED Provider Notes (Signed)
History     CSN: [id number]  Arrival date & time 12/02/11  1408   First MD Initiated Contact with Patient 12/02/11 1859      Chief Complaint  Patient presents with  . Leg Swelling    (Consider location/radiation/quality/duration/timing/severity/associated sxs/prior treatment) The history is provided by the patient.   78 year old female comes in because of leg swelling for the last week. Swelling has been getting progressively worse, and she is complaining of pain in her legs. She rates pain at 10/10. She denies chest pain, heaviness, tightness, pressure. She denies dyspnea. She denies fever, chills, sweats. She takes furosemide for her swelling, but it has not been helping. She is also on warfarin for atrial fibrillation.  Past Medical History  Diagnosis Date  . Depression   . Atrial fibrillation   . Scoliosis   . Osteoarthritis   . Pacemaker     Past Surgical History  Procedure Date  . Pacemaker insertion   . Hernia repair   . Abdominal hysterectomy   . Cholecystectomy   . Appendectomy   . Breast surgery   . Back surgery     No family history on file.  History  Substance Use Topics  . Smoking status: Former Research scientist (life sciences)  . Smokeless tobacco: Not on file  . Alcohol Use: Yes     frequently    OB History    Grav Para Term Preterm Abortions TAB SAB Ect Mult Living                  Review of Systems  All other systems reviewed and are negative.    Allergies  Iodine; Papaya derivatives; Penicillins; and Sulfa antibiotics  Home Medications   Current Outpatient Rx  Name Route Sig Dispense Refill  . ARIPIPRAZOLE 2 MG PO TABS Oral Take 1 tablet (2 mg total) by mouth daily. 30 tablet 0  . CARVEDILOL 6.25 MG PO TABS Oral Take 6.25 mg by mouth 2 (two) times daily with a meal.      . ESCITALOPRAM OXALATE 10 MG PO TABS Oral Take 1 tablet (10 mg total) by mouth daily. 30 tablet 0  . FUROSEMIDE 40 MG PO TABS Oral Take 40 mg by mouth daily.      [name]  HYDROCODONE-ACETAMINOPHEN 5-500 MG PO TABS Oral Take 1 tablet by mouth every 6 (six) hours as needed. pain    . POTASSIUM CHLORIDE ER 10 MEQ PO TBCR Oral Take 1 tablet (10 mEq total) by mouth 2 (two) times daily. 10 tablet 0  . TEMAZEPAM 30 MG PO CAPS Oral Take 30 mg by mouth at bedtime. Sleep aid    . WARFARIN SODIUM 3 MG PO TABS Oral Take 3 mg by mouth daily.       BP 151/84  Pulse 70  Temp 97.6 F (36.4 C) (Oral)  Resp 18  SpO2 99%  Physical Exam  Nursing note and vitals reviewed.  78 year old female is resting comfortably and in no acute distress. Vital signs are significant for borderline hypertension with blood pressure 141/69. Oxygen saturation is 99% which is normal. Head is normocephalic and atraumatic. PERRLA, EOMI. Neck is nontender and supple without adenopathy or JVD. Back is nontender. Lungs are clear without rales, wheezes, rhonchi. Heart has regular rate and rhythm without murmur. Abdomen is soft, flat, nontender without masses or hepatosplenomegaly. Extremities: There is a 2+ pitting edema of the lower legs with no edema in the thigh or presacral area. Also, there is no pedal edema. Leg swelling  is symmetric. There's negative Homans sign. Skin is warm and dry with venous stasis changes noted in the lower legs but no other rashes seen. Neurologic: Mental status is normal, cranial nerves are intact, there are no motor or sensory deficits.  ED Course  Procedures (including critical care time)  Results for orders placed during the hospital encounter of 12/02/11  POCT I-STAT, CHEM 8      Component Value Range   Sodium 139  135 - 145 mEq/L   Potassium 3.4 (*) 3.5 - 5.1 mEq/L   Chloride 103  96 - 112 mEq/L   BUN 21  6 - 23 mg/dL   Creatinine, Ser 1.30 (*) 0.50 - 1.10 mg/dL   Glucose, Bld 104 (*) 70 - 99 mg/dL   Calcium, Ion 1.12 (*) 1.13 - 1.30 mmol/L   TCO2 25  0 - 100 mmol/L   Hemoglobin 10.9 (*) 12.0 - 15.0 g/dL   HCT 32.0 (*) 36.0 - 46.0 %  URINALYSIS, ROUTINE W REFLEX  MICROSCOPIC      Component Value Range   Color, Urine YELLOW  YELLOW   APPearance CLEAR  CLEAR   Specific Gravity, Urine 1.008  1.005 - 1.030   pH 7.5  5.0 - 8.0   Glucose, UA NEGATIVE  NEGATIVE mg/dL   Hgb urine dipstick NEGATIVE  NEGATIVE   Bilirubin Urine NEGATIVE  NEGATIVE   Ketones, ur NEGATIVE  NEGATIVE mg/dL   Protein, ur NEGATIVE  NEGATIVE mg/dL   Urobilinogen, UA 0.2  0.0 - 1.0 mg/dL   Nitrite NEGATIVE  NEGATIVE   Leukocytes, UA NEGATIVE  NEGATIVE  PROTIME-INR      Component Value Range   Prothrombin Time 16.3 (*) 11.6 - 15.2 seconds   INR 1.29  0.00 - 1.49  CBC WITH DIFFERENTIAL      Component Value Range   WBC 5.7  4.0 - 10.5 K/uL   RBC 3.42 (*) 3.87 - 5.11 MIL/uL   Hemoglobin 12.2  12.0 - 15.0 g/dL   HCT 35.5 (*) 36.0 - 46.0 %   MCV 103.8 (*) 78.0 - 100.0 fL   MCH 35.7 (*) 26.0 - 34.0 pg   MCHC 34.4  30.0 - 36.0 g/dL   RDW 13.9  11.5 - 15.5 %   Platelets 202  150 - 400 K/uL   Neutrophils Relative 62  43 - 77 %   Neutro Abs 3.5  1.7 - 7.7 K/uL   Lymphocytes Relative 24  12 - 46 %   Lymphs Abs 1.3  0.7 - 4.0 K/uL   Monocytes Relative 10  3 - 12 %   Monocytes Absolute 0.6  0.1 - 1.0 K/uL   Eosinophils Relative 4  0 - 5 %   Eosinophils Absolute 0.2  0.0 - 0.7 K/uL   Basophils Relative 0  0 - 1 %   Basophils Absolute 0.0  0.0 - 0.1 K/uL   Dg Chest 2 View  12/02/2011  *RADIOLOGY REPORT*  Clinical Data: Leg swelling.  Shortness of breath.  Atrial fibrillation.  CHEST - 2 VIEW  Comparison: 11/27/2011  Findings: The patient has left-sided transvenous pacemaker / AICD. Leads overlie the coronary sinus, right ventricle.  The heart is enlarged.  Lungs are hyperinflated.  There are no focal consolidations or pleural effusions.  No edema.  Patient has had prior lower cervical fusion.  IMPRESSION:  1.  Cardiomegaly without pulmonary edema. 2.  Hyperinflation without focal pulmonary abnormality.  Original Report Authenticated By: Glenice Bow, M.D.  1. Peripheral  edema       MDM  Worsening peripheral edema. I do not see any evidence of left-sided heart failure. She will be given additional furosemide in the emergency department and renal function checked.  She had good diuresis with Lasix. Workup did not show an indication for hospitalization, the patient stated she was not comfortable going home at this point. She is moved to CDU for observation overnight and with anticipation of discharging home in the morning. INR is subtherapeutic and she will need to contact her PCP for instructions on how to adjust your warfarin dose.  Delora Fuel, MD [phone] [id number]

## 2011-12-02 NOTE — ED Notes (Signed)
Pt was triaged earlier with leg pain, pt went to [facility] and now c/o shortness of breath. Pt re-evaluated

## 2011-12-02 NOTE — ED Notes (Signed)
Chart returned from [place] office  .There are no oral op abx that can be given for this patients UTI due to resistance,warfarin/poor renal function. Patient may return for iv abx if she has increasing sts per [facility].

## 2011-12-03 MED ORDER — DIPHENHYDRAMINE HCL 25 MG PO CAPS
25.0000 mg | ORAL_CAPSULE | Freq: Once | ORAL | Status: AC
  Administered 2011-12-03: 25 mg via ORAL
  Filled 2011-12-03: qty 1

## 2011-12-03 MED ORDER — HYDROCODONE-ACETAMINOPHEN 5-325 MG PO TABS
1.0000 | ORAL_TABLET | Freq: Once | ORAL | Status: AC
  Administered 2011-12-03: 1 via ORAL
  Filled 2011-12-03: qty 1

## 2011-12-03 MED ORDER — HYDROCORTISONE 1 % EX CREA
TOPICAL_CREAM | Freq: Three times a day (TID) | CUTANEOUS | Status: DC
  Administered 2011-12-03 (×2): via TOPICAL
  Filled 2011-12-03: qty 28

## 2011-12-03 NOTE — ED Notes (Signed)
Pt very needy and frequently on call bell

## 2011-12-03 NOTE — ED Notes (Signed)
Several attempts made to contact patient : no answer

## 2011-12-03 NOTE — ED Notes (Signed)
Pt. Rang call bell I WENT ROOM PT.STATED THAT SHE HAD LOSE HER  HUSBAND OF SIXTY YEARS .THEN PT. STARTED CRYING.THAN SHE ASKED .FOR A CUP OF COFFEE WITH CREAM &SUGAR.WHEN I BROUGHT HER THE COFFEE.SHE STARTED TALKIN ABOUT THINGS THAT I DIDN'T KNOW.THEN SHE STARTED CRUSING AN BAD MOUTHING STAFF.SHE TALKS  ALTER.I WAS NICE, SMILED Kimberly [name] IF YOU ANYTHING PLEASE RING YOUR CALLLIGHT   IF YOU NEED SOMETHING.

## 2011-12-03 NOTE — ED Provider Notes (Signed)
8:26 AM Patient is in the CDU under observation, CHF protocol.  This is a shared visit with Dr Roxanne Mins.  Sign out received from Dr Sabra Heck.  Patient has been challenging for staff this morning and nurses and nurse tech have reported mean and aggressive comments toward staff.  Pt was pleasant to me, states she is feeling fine and is not having any physical discomfort at present.  States the swelling in her legs has improved.  Pt is A&O, NAD, RRR, CTAB, lower extremities with edema at bilateral ankles, equal bilaterally, nontender, distal pulses intact.  Pt to be discharged.  Dr Roxanne Mins has filled out discharge instructions.  Pt states she has all of her medications at home.  Pt informed about low INR, encouraged to follow closely with [facility]CP at [facility].  Pt verbalizes understanding and agrees with plan.     9:32 AM Pt previously discharged, now stating she is too tired to drive home after eating big breakfast.  Have temporarily cancelled discharge until pt is feeling more rested and is able to leave.    Patient had uneventful stay following this morning.  She napped and ate lunch, then was discharged home.    Results for orders placed during the hospital encounter of 12/02/11  POCT I-STAT, CHEM 8      Component Value Range   Sodium 139  135 - 145 mEq/L   Potassium 3.4 (*) 3.5 - 5.1 mEq/L   Chloride 103  96 - 112 mEq/L   BUN 21  6 - 23 mg/dL   Creatinine, Ser 1.30 (*) 0.50 - 1.10 mg/dL   Glucose, Bld 104 (*) 70 - 99 mg/dL   Calcium, Ion 1.12 (*) 1.13 - 1.30 mmol/L   TCO2 25  0 - 100 mmol/L   Hemoglobin 10.9 (*) 12.0 - 15.0 g/dL   HCT 32.0 (*) 36.0 - 46.0 %  URINALYSIS, ROUTINE W REFLEX MICROSCOPIC      Component Value Range   Color, Urine YELLOW  YELLOW   APPearance CLEAR  CLEAR   Specific Gravity, Urine 1.008  1.005 - 1.030   pH 7.5  5.0 - 8.0   Glucose, UA NEGATIVE  NEGATIVE mg/dL   Hgb urine dipstick NEGATIVE  NEGATIVE   Bilirubin Urine NEGATIVE  NEGATIVE   Ketones, ur  NEGATIVE  NEGATIVE mg/dL   Protein, ur NEGATIVE  NEGATIVE mg/dL   Urobilinogen, UA 0.2  0.0 - 1.0 mg/dL   Nitrite NEGATIVE  NEGATIVE   Leukocytes, UA NEGATIVE  NEGATIVE  PROTIME-INR      Component Value Range   Prothrombin Time 16.3 (*) 11.6 - 15.2 seconds   INR 1.29  0.00 - 1.49  CBC WITH DIFFERENTIAL      Component Value Range   WBC 5.7  4.0 - 10.5 K/uL   RBC 3.42 (*) 3.87 - 5.11 MIL/uL   Hemoglobin 12.2  12.0 - 15.0 g/dL   HCT 35.5 (*) 36.0 - 46.0 %   MCV 103.8 (*) 78.0 - 100.0 fL   MCH 35.7 (*) 26.0 - 34.0 pg   MCHC 34.4  30.0 - 36.0 g/dL   RDW 13.9  11.5 - 15.5 %   Platelets 202  150 - 400 K/uL   Neutrophils Relative 62  43 - 77 %   Neutro Abs 3.5  1.7 - 7.7 K/uL   Lymphocytes Relative 24  12 - 46 %   Lymphs Abs 1.3  0.7 - 4.0 K/uL   Monocytes Relative 10  3 -  12 %   Monocytes Absolute 0.6  0.1 - 1.0 K/uL   Eosinophils Relative 4  0 - 5 %   Eosinophils Absolute 0.2  0.0 - 0.7 K/uL   Basophils Relative 0  0 - 1 %   Basophils Absolute 0.0  0.0 - 0.1 K/uL   Dg Chest 2 View  12/02/2011  *RADIOLOGY REPORT*  Clinical Data: Leg swelling.  Shortness of breath.  Atrial fibrillation.  CHEST - 2 VIEW  Comparison: 11/27/2011  Findings: The patient has left-sided transvenous pacemaker / AICD. Leads overlie the coronary sinus, right ventricle.  The heart is enlarged.  Lungs are hyperinflated.  There are no focal consolidations or pleural effusions.  No edema.  Patient has had prior lower cervical fusion.  IMPRESSION:  1.  Cardiomegaly without pulmonary edema. 2.  Hyperinflation without focal pulmonary abnormality.  Original Report Authenticated By: Glenice Bow, M.D.   Dg Chest 2 View  11/27/2011  *RADIOLOGY REPORT*  Clinical Data: Short of breath, weakness  CHEST - 2 VIEW  Comparison: Chest radiograph 01/22/2011  Findings: Left-sided pacemaker with three continuous leads overlies the heart silhouette.  Lungs are hyperinflated.  No effusion, infiltrate, or pneumothorax.  IMPRESSION:   1. No acute cardiopulmonary process. 2.  Hyperinflated lungs.  Original Report Authenticated By: Suzy Bouchard, M.D.   Ct Head Wo Contrast  11/27/2011  *RADIOLOGY REPORT*  Clinical Data: Confusion, crying, posterior, extremity weakness  CT HEAD WITHOUT CONTRAST  Technique:  Contiguous axial images were obtained from the base of the skull through the vertex without contrast.  Comparison: Head CT 03/20/2009  Findings: No acute intracranial hemorrhage.  No focal mass lesion. No CT evidence of acute infarction.   No midline shift or mass effect.  No hydrocephalus.  Basilar cisterns are patent. Paranasal sinuses and mastoid air cells are clear.  Orbits are normal. Mild cortical atrophy.  IMPRESSION: No acute intracranial findings.  Original Report Authenticated By: Suzy Bouchard, M.D.      [place], [place] 12/03/11 [place], [place] 12/03/11 1445

## 2011-12-03 NOTE — ED Notes (Signed)
Regular Diet ordered spoke with Lavella Lemons

## 2011-12-03 NOTE — ED Notes (Signed)
Patient examined this morning, has no peripheral edema, continues to complain of fibromyalgia which she states she has had for 15 years. She denies any chest pain or difficulty breathing, has been informed that she will be discharged this morning, states that she drove herself and also states "I have a big bag of Vicodin for pain control at home".  Will refrain from giving her any pain medication prior to discharge, she is eating breakfast at this time.  [name], MD 12/03/11 [phone]

## 2011-12-03 NOTE — ED Provider Notes (Signed)
Medical screening examination/treatment/procedure(s) were conducted as a shared visit with non-physician practitioner(s) and myself.  I personally evaluated the patient during the encounter   Delora Fuel, MD [phone]

## 2011-12-03 NOTE — ED Notes (Signed)
Complaining of pain and cramping in her legs after getting up to bedside commode. States ongoing problem due to her fibromyalgia. EDP notified. Will continue to monitor patient.

## 2011-12-03 NOTE — ED Notes (Addendum)
Pt refusing Ambien because she is afraid it will make her sleep walk

## 2011-12-03 NOTE — Progress Notes (Signed)
Presented patient with materials about PACE and discussed [place] options. Patient expressed interest in someone coming to her. She states she is fine to drive home and in fact takes "pictures of the changes on 220 as proof". I have contacted her PCP ([facility]) and scheduled an appt. For tomorrow 8/1 for 2:30pm so PCP can evaluate baseline and make necessary home recommendations.

## 2011-12-03 NOTE — ED Notes (Signed)
WEnt to discharge pt. And pt. Verbalized, "I am too tired to drive home after eating breakfast"  "I am very sleepy and I need to take a nap before I drive home."   Reported this to [place], [place]

## 2011-12-03 NOTE — ED Notes (Signed)
Spoke with pt re: UTI, explained that she would need to return for IV antibiotics, pt sts she will follow up with her PCP tomorrow (already has an appointment).

## 2011-12-03 NOTE — ED Notes (Signed)
There are no oral op abx that can be given for this patients UTI due to resistance warfin/poor renal function. Patient may return for IV abx if she has increasing sts

## 2011-12-03 NOTE — ED Notes (Signed)
Went into see pt. And pt. Was very inappropriate and told RN to Shut-up.  She preceded to explain that she needs her Vicodin prior to leaving.  Explained to her that Dr. Sabra Heck is not giving her any pain medication due to her driving her self home.  Pt. Not happy with that.  Spoke with Raquel Sarna PA and she will be into speak with pt.

## 2011-12-03 NOTE — ED Notes (Signed)
Pt complaining about 2 spots of "poisen ivy" one on each mid forearm. Visible erythema, negative edema, both sites less than 1 cm. MD ordered PO benedryl and topical hydrocortisone cream.

## 2011-12-04 ENCOUNTER — Other Ambulatory Visit: Payer: Self-pay

## 2011-12-04 ENCOUNTER — Emergency Department (HOSPITAL_COMMUNITY)
Admission: EM | Admit: 2011-12-04 | Discharge: 2011-12-04 | Disposition: A | Discharge status: Home / Self Care | Payer: Medicare Other | Attending: Emergency Medicine | Admitting: Emergency Medicine

## 2011-12-04 ENCOUNTER — Encounter (HOSPITAL_COMMUNITY): Payer: Self-pay | Admitting: *Deleted

## 2011-12-04 DIAGNOSIS — I4891 Unspecified atrial fibrillation: Secondary | ICD-10-CM | POA: Insufficient documentation

## 2011-12-04 DIAGNOSIS — M7989 Other specified soft tissue disorders: Secondary | ICD-10-CM | POA: Insufficient documentation

## 2011-12-04 DIAGNOSIS — R21 Rash and other nonspecific skin eruption: Secondary | ICD-10-CM | POA: Insufficient documentation

## 2011-12-04 DIAGNOSIS — R220 Localized swelling, mass and lump, head: Secondary | ICD-10-CM | POA: Insufficient documentation

## 2011-12-04 DIAGNOSIS — Z9089 Acquired absence of other organs: Secondary | ICD-10-CM | POA: Insufficient documentation

## 2011-12-04 DIAGNOSIS — Z79899 Other long term (current) drug therapy: Secondary | ICD-10-CM | POA: Insufficient documentation

## 2011-12-04 DIAGNOSIS — F329 Major depressive disorder, single episode, unspecified: Secondary | ICD-10-CM | POA: Insufficient documentation

## 2011-12-04 DIAGNOSIS — R0602 Shortness of breath: Secondary | ICD-10-CM | POA: Insufficient documentation

## 2011-12-04 DIAGNOSIS — F3289 Other specified depressive episodes: Secondary | ICD-10-CM | POA: Insufficient documentation

## 2011-12-04 LAB — URINALYSIS, ROUTINE W REFLEX MICROSCOPIC
Bilirubin Urine: NEGATIVE
Hgb urine dipstick: NEGATIVE
Protein, ur: NEGATIVE mg/dL
Urobilinogen, UA: 0.2 mg/dL (ref 0.0–1.0)

## 2011-12-04 MED ORDER — DIPHENHYDRAMINE HCL 25 MG PO CAPS
25.0000 mg | ORAL_CAPSULE | Freq: Once | ORAL | Status: AC
  Administered 2011-12-04: 25 mg via ORAL
  Filled 2011-12-04: qty 1

## 2011-12-04 MED ORDER — DIPHENHYDRAMINE HCL 25 MG PO TABS: 25.0000 mg | ORAL_TABLET | Freq: Four times a day (QID) | ORAL | Status: DC

## 2011-12-04 NOTE — ED Notes (Signed)
[id number] Expected date:12/04/11<BR> Expected time: 9:08 AM<BR> Means of arrival:Ambulance<BR> Comments:<BR> 78 y/o allergic reaction/rash-given benedryl

## 2011-12-04 NOTE — ED Notes (Addendum)
Per ems pt is from home. Alert and oriented x4. Ambulatory. Pt reports having 2 allergic reactions in last 2 days, unsure what causes reaction. When ems arrived rash noted to arms and swelling. Tongue swelling that persists at present. Denies sob or chest pain. Reports rash itches and painful. 20 g L forearm, 50 mg benadryl given en route.   Pt reports that she lives alone now. Pt has hx of afib and a pacemaker in place.

## 2011-12-04 NOTE — ED Provider Notes (Addendum)
History     CSN: [id number]  Arrival date & time 12/04/11  0919   First MD Initiated Contact with Patient 12/04/11 [phone]      Chief Complaint  Patient presents with  . Allergic Reaction    (Consider location/radiation/quality/duration/timing/severity/associated sxs/prior treatment) HPI Comments: Pt presents for evaluation of tongue swelling.  She states she thinks she is having an allergic reaction to her medication or something else.  The swelling began this morning shortly after waking.  She describes a fullness in her tongue but denies lip swelling, throat swelling, voice changes, shortness of breath, or new rashes.  She was discharged from [facility] yesterday after an inpt eval and mgmnt of CHF.  She developed several red spots on her arms during the hospitalization for which she was prescribed a steroid cream.  They have diminished in size since beginning the treatment.    Patient is a 78 y.o. female presenting with allergic reaction. The history is provided by the patient. No language interpreter was used.  Allergic Reaction The primary symptoms are  rash. The primary symptoms do not include wheezing, shortness of breath, cough, abdominal pain, nausea, vomiting, diarrhea, dizziness, palpitations, altered mental status, angioedema or urticaria. The current episode started 3 to 5 hours ago. The problem has been gradually worsening. This is a new problem.  The rash is not associated with itching.   Associated with: pt is unsure what may have provoked this. Significant symptoms also include flushing. Significant symptoms that are not present include eye redness, rhinorrhea or itching.    Past Medical History  Diagnosis Date  . Depression   . Atrial fibrillation   . Scoliosis   . Osteoarthritis   . Pacemaker     Past Surgical History  Procedure Date  . Pacemaker insertion   . Hernia repair   . Abdominal hysterectomy   . Cholecystectomy   . Appendectomy   . Breast surgery   . Back  surgery     History reviewed. No pertinent family history.  History  Substance Use Topics  . Smoking status: Former Research scientist (life sciences)  . Smokeless tobacco: Not on file  . Alcohol Use: Yes     frequently    OB History    Grav Para Term Preterm Abortions TAB SAB Ect Mult Living                  Review of Systems  Constitutional: Negative for fever, chills, diaphoresis, activity change, appetite change and fatigue.  HENT: Negative for ear pain, nosebleeds, congestion, sore throat, facial swelling, rhinorrhea, drooling, mouth sores, trouble swallowing, neck pain, neck stiffness, dental problem, voice change and tinnitus.   Eyes: Negative for pain, discharge, redness and itching.  Respiratory: Negative for apnea, cough, choking, chest tightness, shortness of breath, wheezing and stridor.   Cardiovascular: Positive for leg swelling. Negative for palpitations.       Chronic  Gastrointestinal: Negative for nausea, vomiting, abdominal pain, diarrhea, constipation and abdominal distention.  Genitourinary: Negative for dysuria, urgency, frequency, flank pain and difficulty urinating.  Musculoskeletal: Positive for myalgias and back pain. Negative for joint swelling, arthralgias and gait problem.       Chronic, "I have fibromyalgia"  Skin: Positive for flushing and rash. Negative for itching.  Neurological: Negative for dizziness, syncope, speech difficulty, weakness and light-headedness.  Hematological: Negative for adenopathy.       Chronic, takes coumadin  Psychiatric/Behavioral: Negative for confusion, dysphoric mood, agitation and altered mental status.    Allergies  Iodine;  Papaya derivatives; Penicillins; and Sulfa antibiotics  Home Medications   Current Outpatient Rx  Name Route Sig Dispense Refill  . ARIPIPRAZOLE 2 MG PO TABS Oral Take 1 tablet (2 mg total) by mouth daily. 30 tablet 0  . CARVEDILOL 6.25 MG PO TABS Oral Take 6.25 mg by mouth 2 (two) times daily with a meal.      .  ESCITALOPRAM OXALATE 10 MG PO TABS Oral Take 1 tablet (10 mg total) by mouth daily. 30 tablet 0  . FOLIVANE-PLUS PO CAPS Oral Take 1 capsule by mouth daily.    . FUROSEMIDE 40 MG PO TABS Oral Take 40 mg by mouth daily.     [name] HYDROCODONE-ACETAMINOPHEN 5-500 MG PO TABS Oral Take 1 tablet by mouth every 4 (four) hours as needed. pain    . POTASSIUM CHLORIDE ER 10 MEQ PO TBCR Oral Take 1 tablet (10 mEq total) by mouth 2 (two) times daily. 10 tablet 0  . TEMAZEPAM 30 MG PO CAPS Oral Take 30 mg by mouth at bedtime. Sleep aid    . WARFARIN SODIUM 3 MG PO TABS Oral Take 3 mg by mouth daily.       BP 115/49  Pulse 73  Temp 97.9 F (36.6 C) (Oral)  Resp 22  SpO2 100%  Physical Exam  Constitutional: She is oriented to person, place, and time. She appears well-developed and well-nourished. No distress.  HENT:  Head: Normocephalic and atraumatic. Head is without Battle's sign, without contusion and without laceration. No trismus in the jaw.  Right Ear: Hearing, tympanic membrane, external ear and ear canal normal.  Left Ear: Hearing, tympanic membrane, external ear and ear canal normal.  Nose: Nose normal. No mucosal edema, rhinorrhea, sinus tenderness or nasal deformity. No epistaxis.  No foreign bodies. Right sinus exhibits no maxillary sinus tenderness and no frontal sinus tenderness. Left sinus exhibits no maxillary sinus tenderness and no frontal sinus tenderness.  Mouth/Throat: Uvula is midline, oropharynx is clear and moist and mucous membranes are normal. Mucous membranes are not pale, not dry and not cyanotic. No oral lesions. No dental abscesses, uvula swelling or lacerations. No oropharyngeal exudate, posterior oropharyngeal edema, posterior oropharyngeal erythema or tonsillar abscesses.       Do not appreciate any tongue swelling, erythema, edema, asymmetry  Eyes: Conjunctivae and EOM are normal. Pupils are equal, round, and reactive to light. Right eye exhibits no discharge. Left eye  exhibits no discharge. No scleral icterus.  Neck: Normal range of motion. Neck supple. No JVD present. No tracheal deviation present. No thyromegaly present.  Cardiovascular: Normal rate, normal heart sounds and normal pulses.  An irregularly irregular rhythm present. PMI is not displaced.  Exam reveals no gallop, no distant heart sounds and no friction rub.   Pulmonary/Chest: Effort normal and breath sounds normal. No stridor. No respiratory distress. She has no wheezes. She has no rales. She exhibits no tenderness.  Abdominal: Soft. Bowel sounds are normal. She exhibits no distension and no mass. There is no tenderness. There is no rebound and no guarding.  Musculoskeletal: Normal range of motion. She exhibits edema. She exhibits no tenderness.  Lymphadenopathy:    She has no cervical adenopathy.  Neurological: She is alert and oriented to person, place, and time. She has normal strength. No cranial nerve deficit or sensory deficit. GCS eye subscore is 4. GCS verbal subscore is 5. GCS motor subscore is 6.  Skin: Skin is warm and dry. Laceration noted. No abrasion, no bruising, no burn,  no ecchymosis, no lesion and no rash noted. She is not diaphoretic. There is erythema. No pallor.     Psychiatric: She has a normal mood and affect. Her behavior is normal. Judgment and thought content normal. Her mood appears not anxious. Her affect is not angry, not blunt, not labile and not inappropriate. Her speech is not rapid and/or pressured, not delayed, not tangential and not slurred. She is not agitated, not aggressive, not slowed, not withdrawn and not combative. Cognition and memory are normal. Cognition and memory are not impaired. She does not express impulsivity or inappropriate judgment. She does not exhibit a depressed mood. She is communicative. She exhibits normal recent memory and normal remote memory.    ED Course  Procedures (including critical care time)  Labs Reviewed - No data to  display Dg Chest 2 View  12/02/2011  *RADIOLOGY REPORT*  Clinical Data: Leg swelling.  Shortness of breath.  Atrial fibrillation.  CHEST - 2 VIEW  Comparison: 11/27/2011  Findings: The patient has left-sided transvenous pacemaker / AICD. Leads overlie the coronary sinus, right ventricle.  The heart is enlarged.  Lungs are hyperinflated.  There are no focal consolidations or pleural effusions.  No edema.  Patient has had prior lower cervical fusion.  IMPRESSION:  1.  Cardiomegaly without pulmonary edema. 2.  Hyperinflation without focal pulmonary abnormality.  Original Report Authenticated By: Glenice Bow, M.D.     No diagnosis found.    MDM  Pt presents for evaluation of tongue swelling.  Physical exam is currently unremarkable.  Plan serial exams and ER obs.  If s/s do not progress or worsen, plan d/c home.  Pt has been observed in the ER for over 3 hours.  No significant tongue swelling has been apparent after multiple evaluations.  Pt's voice is clear and there is no evidence of angioedema or a systemic hypersensitivity reaction observed.  Plan at this time is to discharge home.  She has a 1430 appt with her primary care physician where she will receive an additional screening evaluation.        Perlie Mayo, MD 12/04/11 1103  Perlie Mayo, MD 12/04/11 1221

## 2011-12-04 NOTE — ED Notes (Signed)
md at bedside

## 2011-12-05 ENCOUNTER — Telehealth: Payer: Self-pay | Admitting: Hematology and Oncology

## 2011-12-05 NOTE — Telephone Encounter (Signed)
lmonvm for pt re calling me for appt w/LO 

## 2011-12-07 LAB — URINE CULTURE

## 2011-12-08 NOTE — ED Notes (Signed)
+   Urine Chart sent to EDP office for review. 

## 2011-12-09 ENCOUNTER — Emergency Department (HOSPITAL_COMMUNITY)
Admission: EM | Admit: 2011-12-09 | Discharge: 2011-12-09 | Disposition: A | Discharge status: Home / Self Care | Payer: Medicare Other | Attending: Emergency Medicine | Admitting: Emergency Medicine

## 2011-12-09 ENCOUNTER — Encounter (HOSPITAL_COMMUNITY): Payer: Self-pay | Admitting: *Deleted

## 2011-12-09 DIAGNOSIS — M4120 Other idiopathic scoliosis, site unspecified: Secondary | ICD-10-CM | POA: Insufficient documentation

## 2011-12-09 DIAGNOSIS — R21 Rash and other nonspecific skin eruption: Secondary | ICD-10-CM

## 2011-12-09 DIAGNOSIS — M1990 Unspecified osteoarthritis, unspecified site: Secondary | ICD-10-CM | POA: Insufficient documentation

## 2011-12-09 DIAGNOSIS — Z950 Presence of cardiac pacemaker: Secondary | ICD-10-CM | POA: Insufficient documentation

## 2011-12-09 DIAGNOSIS — Z87891 Personal history of nicotine dependence: Secondary | ICD-10-CM | POA: Insufficient documentation

## 2011-12-09 DIAGNOSIS — T7840XA Allergy, unspecified, initial encounter: Secondary | ICD-10-CM | POA: Insufficient documentation

## 2011-12-09 DIAGNOSIS — I10 Essential (primary) hypertension: Secondary | ICD-10-CM | POA: Insufficient documentation

## 2011-12-09 DIAGNOSIS — I4891 Unspecified atrial fibrillation: Secondary | ICD-10-CM | POA: Insufficient documentation

## 2011-12-09 MED ORDER — FAMOTIDINE 20 MG PO TABS: 20.0000 mg | ORAL_TABLET | Freq: Two times a day (BID) | ORAL | Status: DC

## 2011-12-09 MED ORDER — FAMOTIDINE 20 MG PO TABS
20.0000 mg | ORAL_TABLET | Freq: Once | ORAL | Status: AC
  Administered 2011-12-09: 20 mg via ORAL
  Filled 2011-12-09: qty 1

## 2011-12-09 MED ORDER — DIPHENHYDRAMINE HCL 25 MG PO TABS: 25.0000 mg | ORAL_TABLET | Freq: Four times a day (QID) | ORAL | Status: DC

## 2011-12-09 MED ORDER — PREDNISONE 20 MG PO TABS: 40.0000 mg | ORAL_TABLET | Freq: Every day | ORAL | Status: AC

## 2011-12-09 MED ORDER — PREDNISONE 20 MG PO TABS
60.0000 mg | ORAL_TABLET | Freq: Once | ORAL | Status: AC
  Administered 2011-12-09: 60 mg via ORAL
  Filled 2011-12-09: qty 3

## 2011-12-09 MED ORDER — DIPHENHYDRAMINE HCL 25 MG PO CAPS
25.0000 mg | ORAL_CAPSULE | Freq: Once | ORAL | Status: AC
  Administered 2011-12-09: 25 mg via ORAL
  Filled 2011-12-09: qty 1

## 2011-12-09 NOTE — ED Provider Notes (Signed)
History     CSN: [id number]  Arrival date & time 12/09/11  0558   First MD Initiated Contact with Patient 12/09/11 0631      Chief Complaint  Patient presents with  . Poison Ivy  . Allergic Reaction    (Consider location/radiation/quality/duration/timing/severity/associated sxs/prior treatment) HPI History provided by patient. Itchy red rash to arms and legs for last few days to weeks. Patient worried she may have poison ivy. Great nephew diagnosed with the same. Patient denies any exposure to plants or outdoor exposures. No known bug bites. Has been taking new medications last few weeks. No difficulty breathing. No trouble swallowing. No history of same. Moderate severity. Past Medical History  Diagnosis Date  . Depression   . Atrial fibrillation   . Scoliosis   . Osteoarthritis   . Pacemaker     Past Surgical History  Procedure Date  . Pacemaker insertion   . Hernia repair   . Abdominal hysterectomy   . Cholecystectomy   . Appendectomy   . Breast surgery   . Back surgery     No family history on file.  History  Substance Use Topics  . Smoking status: Former Research scientist (life sciences)  . Smokeless tobacco: Not on file  . Alcohol Use: Yes     frequently    OB History    Grav Para Term Preterm Abortions TAB SAB Ect Mult Living                  Review of Systems  Constitutional: Negative for fever and chills.  HENT: Negative for neck pain and neck stiffness.   Eyes: Negative for pain.  Respiratory: Negative for shortness of breath.   Cardiovascular: Negative for chest pain.  Gastrointestinal: Negative for abdominal pain.  Genitourinary: Negative for dysuria.  Musculoskeletal: Negative for back pain.  Skin: Positive for rash.  Neurological: Negative for headaches.  All other systems reviewed and are negative.    Allergies  Iodine; Papaya derivatives; Penicillins; and Sulfa antibiotics  Home Medications   Current Outpatient Rx  Name Route Sig Dispense Refill  .  ARIPIPRAZOLE 2 MG PO TABS Oral Take 1 tablet (2 mg total) by mouth daily. 30 tablet 0  . CARVEDILOL 6.25 MG PO TABS Oral Take 6.25 mg by mouth 2 (two) times daily with a meal.      . DIPHENHYDRAMINE HCL 25 MG PO TABS Oral Take 1 tablet (25 mg total) by mouth every 6 (six) hours. 16 tablet 0    For itching  . ESCITALOPRAM OXALATE 10 MG PO TABS Oral Take 1 tablet (10 mg total) by mouth daily. 30 tablet 0  . FOLIVANE-PLUS PO CAPS Oral Take 1 capsule by mouth daily.    . FUROSEMIDE 40 MG PO TABS Oral Take 40 mg by mouth daily.     [name] HYDROCODONE-ACETAMINOPHEN 5-500 MG PO TABS Oral Take 1 tablet by mouth every 4 (four) hours as needed. pain    . POTASSIUM CHLORIDE ER 10 MEQ PO TBCR Oral Take 1 tablet (10 mEq total) by mouth 2 (two) times daily. 10 tablet 0  . TEMAZEPAM 30 MG PO CAPS Oral Take 30 mg by mouth at bedtime. Sleep aid    . WARFARIN SODIUM 3 MG PO TABS Oral Take 3 mg by mouth daily.       BP 147/82  Pulse 77  Temp 98.1 F (36.7 C) (Oral)  Resp 16  SpO2 99%  Physical Exam  Constitutional: She is oriented to person, place, and time.  She appears well-developed and well-nourished.  HENT:  Head: Normocephalic and atraumatic.  Eyes: Conjunctivae and EOM are normal. Pupils are equal, round, and reactive to light.  Neck: Trachea normal. Neck supple. No thyromegaly present.  Cardiovascular: Normal rate, regular rhythm, S1 normal, S2 normal and normal pulses.     No systolic murmur is present   No diastolic murmur is present  Pulses:      Radial pulses are 2+ on the right side, and 2+ on the left side.  Pulmonary/Chest: Effort normal and breath sounds normal. No stridor. She has no wheezes. She has no rhonchi.  Abdominal: Soft. Normal appearance and bowel sounds are normal. There is no tenderness. There is no CVA tenderness and negative Murphy's sign.  Musculoskeletal:       BLE:s Calves nontender, no cords or erythema, negative Homans sign  Neurological: She is alert and oriented to  person, place, and time. She has normal strength. No cranial nerve deficit or sensory deficit. GCS eye subscore is 4. GCS verbal subscore is 5. GCS motor subscore is 6.  Skin: Skin is warm and dry. She is not diaphoretic.       Erythematous and urticarial rash to forearms and lower extremities. No petechiae. Mild excoriation present. No oral lesions. No lingual angioedema.  Psychiatric: Her speech is normal.       Cooperative and appropriate    ED Course  Procedures (including critical care time)  Prednisone, Benadryl and Pepcid.  No anaphylaxis or indication for admission at this time. Plan prescriptions and primary care followup for recheck in 2 days. MDM   Old records reviewed. Nursing notes reviewed. Vital signs reviewed.  May be some psychiatric components contributing to presentation. No overt psychosis. No suicidal or homicidal ideation. No indication for IVC or psychiatric consultation at this time.      Teressa Lower, MD 12/09/11 [phone]

## 2011-12-09 NOTE — ED Notes (Addendum)
Pt states that she has been dealing with poison ivy for several weeks.  She finally figured out the source, which is her great-nephew.  Pt with red, pruritic welts and she states that her tongue feels like it is swelling.  Pt able to speak in complete sentences and no sob.

## 2011-12-09 NOTE — ED Notes (Signed)
Pt c/o of itching on arms and legs from "poison ivy" which the pt states has resulted from her great-nephew spreading it around the house while completing house work. Red welts on arms and legs. Pt talking with no difficulty and no shortness of breath. NAD.

## 2011-12-09 NOTE — ED Notes (Addendum)
rx called to [phone] By PFM

## 2011-12-11 ENCOUNTER — Encounter (HOSPITAL_COMMUNITY): Payer: Self-pay

## 2011-12-11 ENCOUNTER — Emergency Department (HOSPITAL_COMMUNITY)
Admission: EM | Admit: 2011-12-11 | Discharge: 2011-12-11 | Disposition: A | Discharge status: Home / Self Care | Payer: Medicare Other | Attending: Emergency Medicine | Admitting: Emergency Medicine

## 2011-12-11 ENCOUNTER — Encounter (HOSPITAL_COMMUNITY): Payer: Self-pay | Admitting: *Deleted

## 2011-12-11 ENCOUNTER — Emergency Department (HOSPITAL_COMMUNITY)
Admission: EM | Admit: 2011-12-11 | Discharge: 2011-12-11 | Disposition: A | Discharge status: Home / Self Care | Payer: Medicare Other | Source: Home / Self Care | Attending: Emergency Medicine | Admitting: Emergency Medicine

## 2011-12-11 DIAGNOSIS — Z9109 Other allergy status, other than to drugs and biological substances: Secondary | ICD-10-CM | POA: Insufficient documentation

## 2011-12-11 DIAGNOSIS — M1990 Unspecified osteoarthritis, unspecified site: Secondary | ICD-10-CM | POA: Insufficient documentation

## 2011-12-11 DIAGNOSIS — Z950 Presence of cardiac pacemaker: Secondary | ICD-10-CM | POA: Insufficient documentation

## 2011-12-11 DIAGNOSIS — F3289 Other specified depressive episodes: Secondary | ICD-10-CM | POA: Insufficient documentation

## 2011-12-11 DIAGNOSIS — M4120 Other idiopathic scoliosis, site unspecified: Secondary | ICD-10-CM | POA: Insufficient documentation

## 2011-12-11 DIAGNOSIS — Z87891 Personal history of nicotine dependence: Secondary | ICD-10-CM | POA: Insufficient documentation

## 2011-12-11 DIAGNOSIS — Z7901 Long term (current) use of anticoagulants: Secondary | ICD-10-CM | POA: Insufficient documentation

## 2011-12-11 DIAGNOSIS — Z79899 Other long term (current) drug therapy: Secondary | ICD-10-CM | POA: Insufficient documentation

## 2011-12-11 DIAGNOSIS — Z91018 Allergy to other foods: Secondary | ICD-10-CM | POA: Insufficient documentation

## 2011-12-11 DIAGNOSIS — L299 Pruritus, unspecified: Secondary | ICD-10-CM | POA: Insufficient documentation

## 2011-12-11 DIAGNOSIS — IMO0001 Reserved for inherently not codable concepts without codable children: Secondary | ICD-10-CM | POA: Insufficient documentation

## 2011-12-11 DIAGNOSIS — Z880 Allergy status to penicillin: Secondary | ICD-10-CM | POA: Insufficient documentation

## 2011-12-11 DIAGNOSIS — I4891 Unspecified atrial fibrillation: Secondary | ICD-10-CM | POA: Insufficient documentation

## 2011-12-11 DIAGNOSIS — T7840XA Allergy, unspecified, initial encounter: Secondary | ICD-10-CM

## 2011-12-11 DIAGNOSIS — F329 Major depressive disorder, single episode, unspecified: Secondary | ICD-10-CM | POA: Insufficient documentation

## 2011-12-11 DIAGNOSIS — Z882 Allergy status to sulfonamides status: Secondary | ICD-10-CM | POA: Insufficient documentation

## 2011-12-11 HISTORY — DX: Fibromyalgia: M79.7

## 2011-12-11 LAB — BASIC METABOLIC PANEL
BUN: 20 mg/dL (ref 6–23)
Calcium: 10 mg/dL (ref 8.4–10.5)
Creatinine, Ser: 1.05 mg/dL (ref 0.50–1.10)
GFR calc Af Amer: 56 mL/min — ABNORMAL LOW (ref >90)
GFR calc non Af Amer: 48 mL/min — ABNORMAL LOW (ref >90)
Glucose, Bld: 121 mg/dL — ABNORMAL HIGH (ref 70–99)
Potassium: 4.1 mEq/L (ref 3.5–5.1)

## 2011-12-11 LAB — CBC WITH DIFFERENTIAL/PLATELET
Basophils Relative: 0 % (ref 0–1)
Eosinophils Absolute: 0 10*3/uL (ref 0.0–0.7)
Eosinophils Relative: 0 % (ref 0–5)
Hemoglobin: 10.6 g/dL — ABNORMAL LOW (ref 12.0–15.0)
MCH: 35.5 pg — ABNORMAL HIGH (ref 26.0–34.0)
MCHC: 34.5 g/dL (ref 30.0–36.0)
MCV: 102.7 fL — ABNORMAL HIGH (ref 78.0–100.0)
Monocytes Relative: 3 % (ref 3–12)
Neutrophils Relative %: 89 % — ABNORMAL HIGH (ref 43–77)

## 2011-12-11 MED ORDER — LORAZEPAM 1 MG PO TABS
1.0000 mg | ORAL_TABLET | Freq: Once | ORAL | Status: AC
  Administered 2011-12-11: 1 mg via ORAL
  Filled 2011-12-11: qty 1

## 2011-12-11 MED ORDER — FAMOTIDINE IN NACL 20-0.9 MG/50ML-% IV SOLN
20.0000 mg | Freq: Once | INTRAVENOUS | Status: AC
  Administered 2011-12-11: 20 mg via INTRAVENOUS
  Filled 2011-12-11: qty 50

## 2011-12-11 MED ORDER — METHYLPREDNISOLONE SODIUM SUCC 125 MG IJ SOLR
62.5000 mg | Freq: Once | INTRAMUSCULAR | Status: AC
  Administered 2011-12-11: 62.5 mg via INTRAVENOUS
  Filled 2011-12-11: qty 2

## 2011-12-11 MED ORDER — DIPHENHYDRAMINE HCL 50 MG/ML IJ SOLN
12.5000 mg | Freq: Once | INTRAMUSCULAR | Status: AC
  Administered 2011-12-11: 12.5 mg via INTRAVENOUS
  Filled 2011-12-11: qty 1

## 2011-12-11 NOTE — ED Provider Notes (Signed)
History  This chart was scribed for Virgel Manifold, MD by Jenne Campus. This patient was seen in room APA06/APA06 and the patient's care was started at 9:45PM.  CSN: [id number]  Arrival date & time 12/11/11  [id number]   First MD Initiated Contact with Patient 12/11/11 2145      Chief Complaint  Patient presents with  . Pruritis    The history is provided by the patient. No language interpreter was used.    Kimberly [name] is a 78 y.o. female who presents to the Emergency Department complaining of 12 hours of constant, moderate pruritis on her head, and arms with associated tongue swelling that she attributes to an allergic reaction from mosquito bites. She reports taking benadryl, Pepcid, hydrocortisone and prednisone with no relief in her symptoms. She reports being put on several new medications before the itching started but cannot say which ones are new. She reports prior episodes of same symptoms and has been seen in the ED 6 times within the past 7 weeks for the same but has not followed up with her PCP yet. She was seen in this ED this morning for the same and discharged home with instructions to begin using Claritin and to follow up with a dermatologist. She states that she is back because she couldn't find a ride home and is still itching. She denies emesis, nausea, diarrhea, fever and chills as associated symptoms. She has a h/o A. Fib, depression and fibromyalgia. Pt is an occasional alcohol user and former smoker.  PCP is Dr. Jacelyn Grip in [place].  Past Medical History  Diagnosis Date  . Depression   . Atrial fibrillation   . Scoliosis   . Osteoarthritis   . Pacemaker   . Fibromyalgia     Past Surgical History  Procedure Date  . Pacemaker insertion   . Hernia repair   . Abdominal hysterectomy   . Cholecystectomy   . Appendectomy   . Breast surgery   . Back surgery     History reviewed. No pertinent family history.  History  Substance Use Topics  . Smoking status: Former  Research scientist (life sciences)  . Smokeless tobacco: Not on file  . Alcohol Use: Yes     frequently    No OB history provided.  Review of Systems  Constitutional: Negative for fever and chills.  Gastrointestinal: Negative for nausea, vomiting, abdominal pain and diarrhea.  Skin: Negative for rash.       Generalized itching  All other systems reviewed and are negative.    Allergies  Iodine; Papaya derivatives; Penicillins; and Sulfa antibiotics  Home Medications   Current Outpatient Rx  Name Route Sig Dispense Refill  . ARIPIPRAZOLE 2 MG PO TABS Oral Take 1 tablet (2 mg total) by mouth daily. 30 tablet 0  . CARVEDILOL 6.25 MG PO TABS Oral Take 6.25 mg by mouth 2 (two) times daily with a meal.      . DIPHENHYDRAMINE HCL 25 MG PO TABS Oral Take 1 tablet (25 mg total) by mouth every 6 (six) hours. 20 tablet 0  . ESCITALOPRAM OXALATE 10 MG PO TABS Oral Take 1 tablet (10 mg total) by mouth daily. 30 tablet 0  . FAMOTIDINE 20 MG PO TABS Oral Take 1 tablet (20 mg total) by mouth 2 (two) times daily. 30 tablet 0  . FOLIVANE-PLUS PO CAPS Oral Take 1 capsule by mouth daily.    . FUROSEMIDE 40 MG PO TABS Oral Take 40 mg by mouth daily.     [name]  HYDROCODONE-ACETAMINOPHEN 5-500 MG PO TABS Oral Take 1 tablet by mouth every 4 (four) hours as needed. pain    . POTASSIUM CHLORIDE ER 10 MEQ PO TBCR Oral Take 1 tablet (10 mEq total) by mouth 2 (two) times daily. 10 tablet 0  . PREDNISONE 20 MG PO TABS Oral Take 2 tablets (40 mg total) by mouth daily. 15 tablet 0  . TEMAZEPAM 30 MG PO CAPS Oral Take 30 mg by mouth at bedtime.     . WARFARIN SODIUM 3 MG PO TABS Oral Take 3-4.5 mg by mouth daily. Takes 3 mg to 4.5mg  daily alternating.      Triage Vitals: BP 182/90  Pulse 88  Temp 98.1 F (36.7 C) (Oral)  Resp 16  SpO2 100%  Physical Exam  Nursing note and vitals reviewed. Constitutional: She is oriented to person, place, and time. She appears well-developed and well-nourished. No distress.  HENT:  Head: Normocephalic  and atraumatic.       No facial or tongue swelling  Eyes: EOM are normal.  Neck: Neck supple. No tracheal deviation present.  Cardiovascular: Normal rate.   Pulmonary/Chest: Effort normal. No stridor. No respiratory distress. She has no wheezes.  Musculoskeletal: Normal range of motion.  Neurological: She is alert and oriented to person, place, and time.  Skin: Skin is warm and dry.       Small nonspecific erythematous mark to right forearm, no induration, no edema, no cellulitic changes  Psychiatric: She has a normal mood and affect. Her behavior is normal.    ED Course  Procedures (including critical care time)  DIAGNOSTIC STUDIES: Oxygen Saturation is 100% on room air, normal by my interpretation.    COORDINATION OF CARE: 10:00PM-Discussed treatment plan with pt at bedside and pt agreed to plan.   Labs Reviewed - No data to display No results found.   1. Pruritus       MDM  84yF with pruritis. Multiple recent evaluations for same recently. Suspect may have anxiety/psych component. NAD on exam today. Plan symptomatic tx and dermatology fu.    I personally preformed the services scribed in my presence. The recorded information has been reviewed and considered. Virgel Manifold, MD.    Virgel Manifold, MD 12/23/11 1314

## 2011-12-11 NOTE — ED Provider Notes (Signed)
History   This chart was scribed for Nat Christen, MD by Marin Comment . The patient was seen in room APAH2/APAH2. Patient's care was started at 1205.    CSN: [id number]  Arrival date & time 12/11/11  1128   First MD Initiated Contact with Patient 12/11/11 1205      Chief Complaint  Patient presents with  . Pruritis    (Consider location/radiation/quality/duration/timing/severity/associated sxs/prior treatment) HPI Kimberly [name] is a 78 y.o. female who presents to the Emergency Department complaining of generalized, constant, moderate itching with associated tongue swelling and insect bites. Pt states that she has had 6 ER visits in 7 weeks for similar symptoms. Pt states that she has been placed on Benadryl, Pepcid and Prednisone 2 tablets/daily for her symptoms. Pt denies any recent changes in her regular medications. Pt states that her Coumadin levels fluctuate.   PCP: Dr. Jacelyn Grip in [place]  Past Medical History  Diagnosis Date  . Depression   . Atrial fibrillation   . Scoliosis   . Osteoarthritis   . Pacemaker     Past Surgical History  Procedure Date  . Pacemaker insertion   . Hernia repair   . Abdominal hysterectomy   . Cholecystectomy   . Appendectomy   . Breast surgery   . Back surgery     No family history on file.  History  Substance Use Topics  . Smoking status: Former Research scientist (life sciences)  . Smokeless tobacco: Not on file  . Alcohol Use: Yes     frequently    OB History    Grav Para Term Preterm Abortions TAB SAB Ect Mult Living                  Review of Systems A complete 10 system review of systems was obtained and all systems are negative except as noted in the HPI and PMH.   Allergies  Iodine; Papaya derivatives; Penicillins; and Sulfa antibiotics  Home Medications   Current Outpatient Rx  Name Route Sig Dispense Refill  . ARIPIPRAZOLE 2 MG PO TABS Oral Take 1 tablet (2 mg total) by mouth daily. 30 tablet 0  . CARVEDILOL 6.25 MG PO TABS Oral  Take 6.25 mg by mouth 2 (two) times daily with a meal.      . DIPHENHYDRAMINE HCL 25 MG PO TABS Oral Take 1 tablet (25 mg total) by mouth every 6 (six) hours. 20 tablet 0  . ESCITALOPRAM OXALATE 10 MG PO TABS Oral Take 1 tablet (10 mg total) by mouth daily. 30 tablet 0  . FAMOTIDINE 20 MG PO TABS Oral Take 1 tablet (20 mg total) by mouth 2 (two) times daily. 30 tablet 0  . FOLIVANE-PLUS PO CAPS Oral Take 1 capsule by mouth daily.    . FUROSEMIDE 40 MG PO TABS Oral Take 40 mg by mouth daily.     [name] HYDROCODONE-ACETAMINOPHEN 5-500 MG PO TABS Oral Take 1 tablet by mouth every 4 (four) hours as needed. pain    . POTASSIUM CHLORIDE ER 10 MEQ PO TBCR Oral Take 1 tablet (10 mEq total) by mouth 2 (two) times daily. 10 tablet 0  . PREDNISONE 20 MG PO TABS Oral Take 2 tablets (40 mg total) by mouth daily. 15 tablet 0  . TEMAZEPAM 30 MG PO CAPS Oral Take 30 mg by mouth at bedtime.     . WARFARIN SODIUM 3 MG PO TABS Oral Take 3-4.5 mg by mouth daily. Takes 3 mg to 4.5mg  daily alternating.  BP 176/84  Pulse 74  Temp 98 F (36.7 C) (Oral)  Resp 20  Wt 143 lb (64.864 kg)  SpO2 100%  Physical Exam  Nursing note and vitals reviewed. Constitutional: She is oriented to person, place, and time. She appears well-developed and well-nourished. No distress.  HENT:  Head: Normocephalic and atraumatic.  Eyes: EOM are normal. Pupils are equal, round, and reactive to light.  Neck: Neck supple. No tracheal deviation present.  Cardiovascular: Normal rate.   Pulmonary/Chest: Effort normal. No respiratory distress.  Abdominal: Soft. She exhibits no distension.  Musculoskeletal: Normal range of motion. She exhibits no edema.  Neurological: She is alert and oriented to person, place, and time. No sensory deficit.  Skin: Skin is warm and dry. No rash noted.       Diffuse ecchymosis. No visible rash.   Psychiatric: She has a normal mood and affect. Her behavior is normal.    ED Course  Procedures (including  critical care time)  DIAGNOSTIC STUDIES: Oxygen Saturation is 100% on room air, normal by my interpretation.    COORDINATION OF CARE:  12:30-Discussed planned course of treatment with the patient including checking INR and blood work and IV steroidal medications, who is agreeable at this time.   12:45-Medication Orders: Diphenhyrdamine (Benadryl) injection 12.5 mg-once; Famotidine (Pepcid) IVPB 20 mg-once; Methylprednisolone sodium succinate (Solu-medrol) 125 mg/2 mL injection 62.5 mg-once  Results for orders placed during the hospital encounter of 12/11/11  CBC WITH DIFFERENTIAL      Component Value Range   WBC 10.2  4.0 - 10.5 K/uL   RBC 2.99 (*) 3.87 - 5.11 MIL/uL   Hemoglobin 10.6 (*) 12.0 - 15.0 g/dL   HCT 30.7 (*) 36.0 - 46.0 %   MCV 102.7 (*) 78.0 - 100.0 fL   MCH 35.5 (*) 26.0 - 34.0 pg   MCHC 34.5  30.0 - 36.0 g/dL   RDW 13.8  11.5 - 15.5 %   Platelets 245  150 - 400 K/uL   Neutrophils Relative 89 (*) 43 - 77 %   Neutro Abs 9.1 (*) 1.7 - 7.7 K/uL   Lymphocytes Relative 8 (*) 12 - 46 %   Lymphs Abs 0.8  0.7 - 4.0 K/uL   Monocytes Relative 3  3 - 12 %   Monocytes Absolute 0.4  0.1 - 1.0 K/uL   Eosinophils Relative 0  0 - 5 %   Eosinophils Absolute 0.0  0.0 - 0.7 K/uL   Basophils Relative 0  0 - 1 %   Basophils Absolute 0.0  0.0 - 0.1 K/uL  BASIC METABOLIC PANEL      Component Value Range   Sodium 135  135 - 145 mEq/L   Potassium 4.1  3.5 - 5.1 mEq/L   Chloride 99  96 - 112 mEq/L   CO2 28  19 - 32 mEq/L   Glucose, Bld 121 (*) 70 - 99 mg/dL   BUN 20  6 - 23 mg/dL   Creatinine, Ser 1.05  0.50 - 1.10 mg/dL   Calcium 10.0  8.4 - 10.5 mg/dL   GFR calc non Af Amer 48 (*) >90 mL/min   GFR calc Af Amer 56 (*) >90 mL/min  PROTIME-INR      Component Value Range   Prothrombin Time 31.4 (*) 11.6 - 15.2 seconds   INR 2.97 (*) 0.00 - 1.49     No results found.   No diagnosis found.    MDM  Patient feeling much better after IV Solu-Medrol,  Pepcid, Benadryl.  Patient  has Rx for prednisone. Will recommend adding Claritin. Follow up with dermatologist and/or allergist.   I personally performed the services described in this documentation, which was scribed in my presence. The recorded information has been reviewed and considered.       Nat Christen, MD 12/11/11 1524

## 2011-12-11 NOTE — ED Notes (Signed)
Got beside commode.

## 2011-12-11 NOTE — ED Notes (Signed)
Pt states she has had a bunch of medication changes. Complain of itching.

## 2011-12-11 NOTE — ED Notes (Signed)
Itching "all over".  Had been seen here earlier and released, and has checked back in due to itching

## 2011-12-12 ENCOUNTER — Telehealth: Payer: Self-pay | Admitting: Hematology and Oncology

## 2011-12-12 NOTE — Telephone Encounter (Signed)
S/w pt re appt for 8/21 @ 1pm w/LO.

## 2011-12-14 ENCOUNTER — Observation Stay (HOSPITAL_COMMUNITY)
Admission: EM | Admit: 2011-12-14 | Discharge: 2011-12-15 | Disposition: A | Discharge status: Home / Self Care | Payer: Medicare Other | Attending: Internal Medicine | Admitting: Internal Medicine

## 2011-12-14 ENCOUNTER — Emergency Department (HOSPITAL_COMMUNITY): Payer: Medicare Other

## 2011-12-14 ENCOUNTER — Encounter (HOSPITAL_COMMUNITY): Payer: Self-pay | Admitting: Emergency Medicine

## 2011-12-14 DIAGNOSIS — M797 Fibromyalgia: Secondary | ICD-10-CM | POA: Diagnosis present

## 2011-12-14 DIAGNOSIS — IMO0001 Reserved for inherently not codable concepts without codable children: Secondary | ICD-10-CM

## 2011-12-14 DIAGNOSIS — Z7901 Long term (current) use of anticoagulants: Secondary | ICD-10-CM | POA: Insufficient documentation

## 2011-12-14 DIAGNOSIS — E876 Hypokalemia: Secondary | ICD-10-CM

## 2011-12-14 DIAGNOSIS — F329 Major depressive disorder, single episode, unspecified: Secondary | ICD-10-CM | POA: Diagnosis present

## 2011-12-14 DIAGNOSIS — I10 Essential (primary) hypertension: Secondary | ICD-10-CM | POA: Diagnosis present

## 2011-12-14 DIAGNOSIS — M1990 Unspecified osteoarthritis, unspecified site: Secondary | ICD-10-CM | POA: Diagnosis present

## 2011-12-14 DIAGNOSIS — I4891 Unspecified atrial fibrillation: Secondary | ICD-10-CM | POA: Insufficient documentation

## 2011-12-14 DIAGNOSIS — Z950 Presence of cardiac pacemaker: Secondary | ICD-10-CM | POA: Diagnosis present

## 2011-12-14 DIAGNOSIS — R0602 Shortness of breath: Secondary | ICD-10-CM | POA: Insufficient documentation

## 2011-12-14 DIAGNOSIS — F32A Depression, unspecified: Secondary | ICD-10-CM | POA: Diagnosis present

## 2011-12-14 DIAGNOSIS — R42 Dizziness and giddiness: Secondary | ICD-10-CM | POA: Insufficient documentation

## 2011-12-14 DIAGNOSIS — I5032 Chronic diastolic (congestive) heart failure: Secondary | ICD-10-CM | POA: Diagnosis present

## 2011-12-14 DIAGNOSIS — I4821 Permanent atrial fibrillation: Secondary | ICD-10-CM | POA: Diagnosis present

## 2011-12-14 DIAGNOSIS — M6281 Muscle weakness (generalized): Secondary | ICD-10-CM | POA: Insufficient documentation

## 2011-12-14 DIAGNOSIS — R079 Chest pain, unspecified: Principal | ICD-10-CM | POA: Diagnosis present

## 2011-12-14 HISTORY — DX: Essential (primary) hypertension: I10

## 2011-12-14 LAB — CBC WITH DIFFERENTIAL/PLATELET
Basophils Absolute: 0 10*3/uL (ref 0.0–0.1)
Basophils Relative: 0 % (ref 0–1)
Eosinophils Absolute: 0 10*3/uL (ref 0.0–0.7)
Eosinophils Relative: 0 % (ref 0–5)
HCT: 31.9 % — ABNORMAL LOW (ref 36.0–46.0)
MCH: 35.7 pg — ABNORMAL HIGH (ref 26.0–34.0)
MCHC: 35.1 g/dL (ref 30.0–36.0)
MCV: 101.6 fL — ABNORMAL HIGH (ref 78.0–100.0)
Monocytes Absolute: 0.8 10*3/uL (ref 0.1–1.0)
Platelets: 270 10*3/uL (ref 150–400)
RDW: 14 % (ref 11.5–15.5)
WBC: 10.5 10*3/uL (ref 4.0–10.5)

## 2011-12-14 LAB — PROTIME-INR: Prothrombin Time: 19 seconds — ABNORMAL HIGH (ref 11.6–15.2)

## 2011-12-14 LAB — RETICULOCYTES
RBC.: 3.17 MIL/uL — ABNORMAL LOW (ref 3.87–5.11)
Retic Count, Absolute: 72.9 10*3/uL (ref 19.0–186.0)
Retic Ct Pct: 2.3 % (ref 0.4–3.1)

## 2011-12-14 LAB — BASIC METABOLIC PANEL
CO2: 27 mEq/L (ref 19–32)
Calcium: 9.4 mg/dL (ref 8.4–10.5)
Creatinine, Ser: 1.14 mg/dL — ABNORMAL HIGH (ref 0.50–1.10)
GFR calc non Af Amer: 44 mL/min — ABNORMAL LOW (ref >90)
Sodium: 137 mEq/L (ref 135–145)

## 2011-12-14 LAB — CK TOTAL AND CKMB (NOT AT ARMC)
CK, MB: 2.6 ng/mL (ref 0.3–4.0)
Relative Index: INVALID (ref 0.0–2.5)
Total CK: 43 U/L (ref 7–177)

## 2011-12-14 LAB — TROPONIN I: Troponin I: <0.3 ng/mL (ref <0.30)

## 2011-12-14 MED ORDER — TEMAZEPAM 15 MG PO CAPS
30.0000 mg | ORAL_CAPSULE | Freq: Every day | ORAL | Status: DC
  Administered 2011-12-14: 30 mg via ORAL
  Filled 2011-12-14: qty 2

## 2011-12-14 MED ORDER — NITROGLYCERIN 2 % TD OINT
1.0000 [in_us] | TOPICAL_OINTMENT | Freq: Once | TRANSDERMAL | Status: AC
  Administered 2011-12-14: 1 [in_us] via TOPICAL
  Filled 2011-12-14: qty 1

## 2011-12-14 MED ORDER — CARVEDILOL 3.125 MG PO TABS
6.2500 mg | ORAL_TABLET | Freq: Two times a day (BID) | ORAL | Status: DC
  Administered 2011-12-15: 6.25 mg via ORAL
  Filled 2011-12-14: qty 2

## 2011-12-14 MED ORDER — ASPIRIN EC 81 MG PO TBEC
81.0000 mg | DELAYED_RELEASE_TABLET | Freq: Every day | ORAL | Status: DC
  Administered 2011-12-15: 81 mg via ORAL
  Filled 2011-12-14: qty 1

## 2011-12-14 MED ORDER — FLEET ENEMA 7-19 GM/118ML RE ENEM: 1.0000 | ENEMA | Freq: Once | RECTAL | Status: AC | PRN

## 2011-12-14 MED ORDER — HYDROCODONE-ACETAMINOPHEN 5-325 MG PO TABS
1.0000 | ORAL_TABLET | ORAL | Status: DC | PRN
  Administered 2011-12-15: 1 via ORAL
  Filled 2011-12-14: qty 1

## 2011-12-14 MED ORDER — BISACODYL 5 MG PO TBEC: 5.0000 mg | DELAYED_RELEASE_TABLET | Freq: Every day | ORAL | Status: DC | PRN

## 2011-12-14 MED ORDER — PREDNISONE 20 MG PO TABS
40.0000 mg | ORAL_TABLET | Freq: Every day | ORAL | Status: DC
  Administered 2011-12-15: 40 mg via ORAL
  Filled 2011-12-14: qty 2

## 2011-12-14 MED ORDER — NITROGLYCERIN 0.4 MG SL SUBL
0.4000 mg | SUBLINGUAL_TABLET | Freq: Once | SUBLINGUAL | Status: AC
  Administered 2011-12-14: 0.4 mg via SUBLINGUAL
  Filled 2011-12-14: qty 25

## 2011-12-14 MED ORDER — HYDROMORPHONE HCL PF 1 MG/ML IJ SOLN: 0.5000 mg | INTRAMUSCULAR | Status: DC | PRN

## 2011-12-14 MED ORDER — WARFARIN SODIUM 2 MG PO TABS
3.0000 mg | ORAL_TABLET | Freq: Once | ORAL | Status: AC
  Administered 2011-12-14: 3 mg via ORAL
  Filled 2011-12-14: qty 1

## 2011-12-14 MED ORDER — PANTOPRAZOLE SODIUM 40 MG PO TBEC
40.0000 mg | DELAYED_RELEASE_TABLET | Freq: Two times a day (BID) | ORAL | Status: DC
  Administered 2011-12-15: 40 mg via ORAL
  Filled 2011-12-14: qty 1

## 2011-12-14 MED ORDER — DIPHENHYDRAMINE HCL 25 MG PO CAPS
25.0000 mg | ORAL_CAPSULE | Freq: Four times a day (QID) | ORAL | Status: DC
  Administered 2011-12-14 – 2011-12-15 (×3): 25 mg via ORAL
  Filled 2011-12-14 (×3): qty 1

## 2011-12-14 MED ORDER — WARFARIN - PHARMACIST DOSING INPATIENT: Freq: Every day | Status: DC

## 2011-12-14 MED ORDER — ACETAMINOPHEN 325 MG PO TABS
650.0000 mg | ORAL_TABLET | ORAL | Status: DC | PRN
  Administered 2011-12-14: 650 mg via ORAL
  Filled 2011-12-14: qty 2

## 2011-12-14 MED ORDER — ASPIRIN 81 MG PO CHEW
324.0000 mg | CHEWABLE_TABLET | Freq: Once | ORAL | Status: AC
  Administered 2011-12-14: 324 mg via ORAL
  Filled 2011-12-14: qty 4

## 2011-12-14 MED ORDER — POTASSIUM CHLORIDE CRYS ER 20 MEQ PO TBCR
40.0000 meq | EXTENDED_RELEASE_TABLET | Freq: Once | ORAL | Status: AC
  Administered 2011-12-14: 40 meq via ORAL
  Filled 2011-12-14: qty 2

## 2011-12-14 MED ORDER — FUROSEMIDE 40 MG PO TABS
40.0000 mg | ORAL_TABLET | Freq: Every day | ORAL | Status: DC
  Administered 2011-12-15: 40 mg via ORAL
  Filled 2011-12-14: qty 1

## 2011-12-14 MED ORDER — ONDANSETRON HCL 4 MG/2ML IJ SOLN: 4.0000 mg | INTRAMUSCULAR | Status: DC | PRN

## 2011-12-14 MED ORDER — ARIPIPRAZOLE 2 MG PO TABS
2.0000 mg | ORAL_TABLET | Freq: Every evening | ORAL | Status: DC
Start: 2011-12-15 — End: 2011-12-15
  Filled 2011-12-14 (×2): qty 1

## 2011-12-14 MED ORDER — POTASSIUM CHLORIDE CRYS ER 10 MEQ PO TBCR
20.0000 meq | EXTENDED_RELEASE_TABLET | Freq: Two times a day (BID) | ORAL | Status: DC
  Administered 2011-12-14 – 2011-12-15 (×2): 20 meq via ORAL
  Filled 2011-12-14 (×4): qty 2

## 2011-12-14 MED ORDER — MORPHINE SULFATE 4 MG/ML IJ SOLN
4.0000 mg | Freq: Once | INTRAMUSCULAR | Status: AC
  Administered 2011-12-14: 4 mg via INTRAVENOUS
  Filled 2011-12-14: qty 1

## 2011-12-14 MED ORDER — OXYCODONE HCL 5 MG PO TABS
5.0000 mg | ORAL_TABLET | ORAL | Status: DC | PRN
  Administered 2011-12-15: 5 mg via ORAL
  Filled 2011-12-14: qty 1

## 2011-12-14 NOTE — ED Notes (Signed)
Patient reports taking 3 Nitros SL with no relief. Now c/o headache.

## 2011-12-14 NOTE — ED Notes (Signed)
Patient brought in via EMS. Airway patent. Patient c/o non-radiating left sided chest pressure that started today after church. Patient reports shortness of breath.

## 2011-12-14 NOTE — ED Notes (Signed)
Nelwyn Salisbury, NT/Nurse Network engineer notarized her documents and mailed it to her attorney

## 2011-12-14 NOTE — ED Provider Notes (Signed)
History   This chart was scribed for Delora Fuel, MD scribed by Mitchell Heir. The patient was seen in room APA02/APA02 at 15:34   CSN: [id number]  Arrival date & time 12/14/11  107   First MD Initiated Contact with Patient 12/14/11 1531      Chief Complaint  Patient presents with  . Chest Pain    (Consider location/radiation/quality/duration/timing/severity/associated sxs/prior treatment) HPI Kimberly [name] is a 78 y.o. female BIB EMS who presents to the Emergency Department complaining of constant moderate left sided sharp chest pain with associated SOB, HA and diaphoresis onset today approximately 2 hours ago after church today. She denies nausea and says nothing modifies or aggravates the pain. Patient rates pain a 10/10 at its worse and explains currently the pain is close to a 10/10.  She states she has hx of fibromyalgia and scoliosis and says she is unable to tell if the pain is radiating. Patient says she has taken three nitros with mild relief temporarily.  She currently takes warfarin and says it was last taken yesterday night.  Patient lastly states that she has hx of three pacemakers being placed with the last being placed approximately 1 year ago.  Cardiologist: [facility] Past Medical History  Diagnosis Date  . Depression   . Atrial fibrillation   . Scoliosis   . Osteoarthritis   . Pacemaker   . Fibromyalgia   . Hypertension     Past Surgical History  Procedure Date  . Pacemaker insertion   . Hernia repair   . Abdominal hysterectomy   . Cholecystectomy   . Appendectomy   . Breast surgery   . Back surgery     Family History  Problem Relation Age of Onset  . Cancer Sister   . Asthma Sister   . Heart failure Brother     History  Substance Use Topics  . Smoking status: Former Research scientist (life sciences)  . Smokeless tobacco: Not on file  . Alcohol Use: Yes     frequently    OB History    Grav Para Term Preterm Abortions TAB SAB Ect Mult Living   5  2 1 1 3  3   2       Review of Systems  All other systems reviewed and are negative.    Allergies  Iodine; Papaya derivatives; Penicillins; and Sulfa antibiotics  Home Medications   Current Outpatient Rx  Name Route Sig Dispense Refill  . ARIPIPRAZOLE 2 MG PO TABS Oral Take 2 mg by mouth every evening.    [name] CARVEDILOL 6.25 MG PO TABS Oral Take 6.25 mg by mouth 2 (two) times daily with a meal.      . DIPHENHYDRAMINE HCL 25 MG PO TABS Oral Take 1 tablet (25 mg total) by mouth every 6 (six) hours. 20 tablet 0  . FOLIVANE-PLUS PO CAPS Oral Take 1 capsule by mouth daily.    . FUROSEMIDE 40 MG PO TABS Oral Take 40 mg by mouth every morning.     [name] HYDROCODONE-ACETAMINOPHEN 5-500 MG PO TABS Oral Take 1 tablet by mouth every 4 (four) hours as needed. pain    . NITROFURANTOIN MONOHYD MACRO 100 MG PO CAPS Oral Take 100 mg by mouth 2 (two) times daily.    [name] POTASSIUM CHLORIDE ER 10 MEQ PO TBCR Oral Take 1 tablet (10 mEq total) by mouth 2 (two) times daily. 10 tablet 0  . PREDNISONE 20 MG PO TABS Oral Take 2 tablets (40 mg total) by  mouth daily. 15 tablet 0  . TEMAZEPAM 30 MG PO CAPS Oral Take 30 mg by mouth at bedtime.     . WARFARIN SODIUM 3 MG PO TABS Oral Take 3-4.5 mg by mouth daily. Last 3 days of doses have been on Thursday 3 mg , Friday 4.5 mg , Saturday 3 mg.      BP 132/60  Pulse 71  Temp 98 F (36.7 C) (Oral)  Resp 18  Ht 5\' 7"  (1.702 m)  Wt 135 lb (61.236 kg)  BMI 21.14 kg/m2  SpO2 100%  Physical Exam  Nursing note and vitals reviewed. Constitutional: She is oriented to person, place, and time. She appears well-developed and well-nourished. No distress.  HENT:  Head: Normocephalic and atraumatic.  Eyes: Conjunctivae and EOM are normal.  Neck: Neck supple. No tracheal deviation present.  Cardiovascular: Normal rate.   Pulmonary/Chest: Effort normal. No respiratory distress. She exhibits tenderness.       Mild anterior chest wall tenderness  Abdominal: She exhibits no  distension.  Musculoskeletal: Normal range of motion.  Neurological: She is alert and oriented to person, place, and time. No sensory deficit.  Skin: Skin is dry.  Psychiatric: She has a normal mood and affect. Her behavior is normal.    ED Course  Procedures (including critical care time) DIAGNOSTIC STUDIES: Oxygen Saturation is 100% on room air, normal by my interpretation.    COORDINATION OF CARE:  17:12: EDMD performs recheck. Patient notes pain has relieved.  Results for orders placed during the hospital encounter of 12/14/11  CBC WITH DIFFERENTIAL      Component Value Range   WBC 10.5  4.0 - 10.5 K/uL   RBC 3.14 (*) 3.87 - 5.11 MIL/uL   Hemoglobin 11.2 (*) 12.0 - 15.0 g/dL   HCT 31.9 (*) 36.0 - 46.0 %   MCV 101.6 (*) 78.0 - 100.0 fL   MCH 35.7 (*) 26.0 - 34.0 pg   MCHC 35.1  30.0 - 36.0 g/dL   RDW 14.0  11.5 - 15.5 %   Platelets 270  150 - 400 K/uL   Neutrophils Relative 77  43 - 77 %   Neutro Abs 8.0 (*) 1.7 - 7.7 K/uL   Lymphocytes Relative 16  12 - 46 %   Lymphs Abs 1.6  0.7 - 4.0 K/uL   Monocytes Relative 7  3 - 12 %   Monocytes Absolute 0.8  0.1 - 1.0 K/uL   Eosinophils Relative 0  0 - 5 %   Eosinophils Absolute 0.0  0.0 - 0.7 K/uL   Basophils Relative 0  0 - 1 %   Basophils Absolute 0.0  0.0 - 0.1 K/uL  BASIC METABOLIC PANEL      Component Value Range   Sodium 137  135 - 145 mEq/L   Potassium 3.0 (*) 3.5 - 5.1 mEq/L   Chloride 98  96 - 112 mEq/L   CO2 27  19 - 32 mEq/L   Glucose, Bld 90  70 - 99 mg/dL   BUN 19  6 - 23 mg/dL   Creatinine, Ser 1.14 (*) 0.50 - 1.10 mg/dL   Calcium 9.4  8.4 - 10.5 mg/dL   GFR calc non Af Amer 44 (*) >90 mL/min   GFR calc Af Amer 50 (*) >90 mL/min  TROPONIN I      Component Value Range   Troponin I <0.30  <0.30 ng/mL  PROTIME-INR      Component Value Range   Prothrombin Time 19.0 (*)  11.6 - 15.2 seconds   INR 1.56 (*) 0.00 - 1.49  TROPONIN I      Component Value Range   Troponin I <0.30  <0.30 ng/mL   Dg Chest 2  View  12/02/2011  *RADIOLOGY REPORT*  Clinical Data: Leg swelling.  Shortness of breath.  Atrial fibrillation.  CHEST - 2 VIEW  Comparison: 11/27/2011  Findings: The patient has left-sided transvenous pacemaker / AICD. Leads overlie the coronary sinus, right ventricle.  The heart is enlarged.  Lungs are hyperinflated.  There are no focal consolidations or pleural effusions.  No edema.  Patient has had prior lower cervical fusion.  IMPRESSION:  1.  Cardiomegaly without pulmonary edema. 2.  Hyperinflation without focal pulmonary abnormality.  Original Report Authenticated By: Glenice Bow, M.D.   Dg Chest 2 View  11/27/2011  *RADIOLOGY REPORT*  Clinical Data: Short of breath, weakness  CHEST - 2 VIEW  Comparison: Chest radiograph 01/22/2011  Findings: Left-sided pacemaker with three continuous leads overlies the heart silhouette.  Lungs are hyperinflated.  No effusion, infiltrate, or pneumothorax.  IMPRESSION:  1. No acute cardiopulmonary process. 2.  Hyperinflated lungs.  Original Report Authenticated By: Suzy Bouchard, M.D.   Ct Head Wo Contrast  11/27/2011  *RADIOLOGY REPORT*  Clinical Data: Confusion, crying, posterior, extremity weakness  CT HEAD WITHOUT CONTRAST  Technique:  Contiguous axial images were obtained from the base of the skull through the vertex without contrast.  Comparison: Head CT 03/20/2009  Findings: No acute intracranial hemorrhage.  No focal mass lesion. No CT evidence of acute infarction.   No midline shift or mass effect.  No hydrocephalus.  Basilar cisterns are patent. Paranasal sinuses and mastoid air cells are clear.  Orbits are normal. Mild cortical atrophy.  IMPRESSION: No acute intracranial findings.  Original Report Authenticated By: Suzy Bouchard, M.D.   Dg Chest Portable 1 View  12/14/2011  *RADIOLOGY REPORT*  Clinical Data: Chest pain  PORTABLE CHEST - 1 VIEW  Comparison: 12/02/2010  Findings: Hyperinflation.  Biapical pleural parenchymal scarring. No pleural  effusion or pneumothorax.  Borderline cardiomegaly.  Left subclavian ICD.  Cervical spine fixation hardware.  IMPRESSION: No evidence of acute cardiopulmonary disease.  Original Report Authenticated By: Julian Hy, M.D.       Date: 12/14/2011  Rate: 73  Rhythm: Electronic ventricular pacemaker  QRS Axis: left  Intervals: normal  ST/T Wave abnormalities: normal  Conduction Disutrbances:none  Narrative Interpretation: Electronic ventricular pacemaker. When compared with ECG of 12/04/2011, no significant changes are seen.  Old EKG Reviewed: unchanged    1. Chest pain   2. Hypokalemia       MDM  Chest pain of uncertain cause. Workup has been initiated. Prior records are reviewed and she has a defibrillator because of ischemic cardiomyopathy.  She was given aspirin and nitroglycerin with complete relief of her chest pain. Cardiac markers are negative, however, I feel that she will need to have cardiac markers cycled. Case is discussed with Dr. Megan Salon of [facility] who agrees to admit the patient under observation status.  I personally performed the services described in this documentation, which was scribed in my presence. The recorded information has been reviewed and considered.           Delora Fuel, MD [phone]

## 2011-12-14 NOTE — ED Notes (Signed)
Dr Megan Salon in room at this time assessing patient. Will reasess her chest pain when he is finished.

## 2011-12-14 NOTE — ED Notes (Signed)
Went in to assess patient, she is talking with friends and did not stop to answer any questions. Will reasess patient in a short period. Patient is AAx4, NAD noted. Normal paced rhythm is showing on the monitor at this time.

## 2011-12-14 NOTE — ED Notes (Signed)
Pt complaining of 10/10 chest pain and some dizziness. Dr Roxanne Mins made aware and advised to give 1 nitro SL tab

## 2011-12-14 NOTE — Progress Notes (Signed)
ANTICOAGULATION CONSULT NOTE - Initial Consult  Pharmacy Consult for Coumadin Indication: afib  Allergies  Allergen Reactions  . Iodine Other (See Comments)    REACTION:If injected,  Rash/irritated skin reaction "welts"  . Papaya Derivatives Hives  . Penicillins Hives  . Sulfa Antibiotics Rash    Patient Measurements: Height: 5\' 7"  (170.2 cm) Weight: 135 lb (61.236 kg) IBW/kg (Calculated) : 61.6    Vital Signs: Temp: 98.3 F (36.8 C) (08/11 2213) Temp src: Oral (08/11 2213) BP: 145/78 mmHg (08/11 2213) Pulse Rate: 71  (08/11 2213)  Labs:  Basename 12/14/11 1844 12/14/11 1555  HGB -- 11.2*  HCT -- 31.9*  PLT -- 270  APTT -- --  LABPROT -- 19.0*  INR -- 1.56*  HEPARINUNFRC -- --  CREATININE -- 1.14*  CKTOTAL -- --  CKMB -- --  TROPONINI <0.30 <0.30    Estimated Creatinine Clearance: 36.8 ml/min (by C-G formula based on Cr of 1.14).   Medical History: Past Medical History  Diagnosis Date  . Depression   . Atrial fibrillation   . Scoliosis   . Osteoarthritis   . Pacemaker   . Fibromyalgia   . Hypertension     Medications:  Scheduled:    . ARIPiprazole  2 mg Oral QPM  . aspirin  324 mg Oral Once  . aspirin EC  81 mg Oral Daily  . carvedilol  6.25 mg Oral BID WC  . diphenhydrAMINE  25 mg Oral Q6H  . furosemide  40 mg Oral Daily  . morphine  4 mg Intravenous Once  . nitroGLYCERIN  1 inch Topical Once  . nitroGLYCERIN  0.4 mg Sublingual Once  . pantoprazole  40 mg Oral BID AC  . potassium chloride  20 mEq Oral BID  . potassium chloride SA  40 mEq Oral Once  . predniSONE  40 mg Oral Q breakfast  . temazepam  30 mg Oral QHS  . warfarin  3 mg Oral Once  . Warfarin - Pharmacist Dosing Inpatient   Does not apply q1800    Assessment: Unsure of patient's prescribed warfarin dosing regimen, but last dose take 24hr previously and INR is subtherapeutic tonight.  Goal of Therapy:  INR 2-3    Plan:  Will give one of her 3mg  doses tonight and follow  daily INR's  Penni Homans 12/14/2011,10:44 PM

## 2011-12-14 NOTE — ED Notes (Signed)
Pt has been writing up a will in her room and requested a notary to come and withness/sign the document.

## 2011-12-15 ENCOUNTER — Telehealth: Payer: Self-pay | Admitting: Hematology and Oncology

## 2011-12-15 DIAGNOSIS — I4891 Unspecified atrial fibrillation: Secondary | ICD-10-CM

## 2011-12-15 LAB — PROTIME-INR
INR: 1.46 (ref 0.00–1.49)
Prothrombin Time: 18 seconds — ABNORMAL HIGH (ref 11.6–15.2)

## 2011-12-15 LAB — COMPREHENSIVE METABOLIC PANEL
Albumin: 3.2 g/dL — ABNORMAL LOW (ref 3.5–5.2)
Alkaline Phosphatase: 41 U/L (ref 39–117)
BUN: 20 mg/dL (ref 6–23)
Calcium: 9.1 mg/dL (ref 8.4–10.5)
GFR calc Af Amer: 45 mL/min — ABNORMAL LOW (ref >90)
Potassium: 4.2 mEq/L (ref 3.5–5.1)
Sodium: 136 mEq/L (ref 135–145)
Total Protein: 6 g/dL (ref 6.0–8.3)

## 2011-12-15 LAB — VITAMIN B12: Vitamin B-12: 535 pg/mL (ref 211–911)

## 2011-12-15 LAB — CARDIAC PANEL(CRET KIN+CKTOT+MB+TROPI): Troponin I: <0.3 ng/mL (ref <0.30)

## 2011-12-15 LAB — CBC
HCT: 29.9 % — ABNORMAL LOW (ref 36.0–46.0)
MCH: 35.6 pg — ABNORMAL HIGH (ref 26.0–34.0)
MCHC: 34.4 g/dL (ref 30.0–36.0)
RDW: 14.1 % (ref 11.5–15.5)

## 2011-12-15 LAB — IRON AND TIBC
Saturation Ratios: 64 % — ABNORMAL HIGH (ref 20–55)
UIBC: 134 ug/dL (ref 125–400)

## 2011-12-15 LAB — HEMOGLOBIN A1C: Mean Plasma Glucose: 120 mg/dL — ABNORMAL HIGH (ref <117)

## 2011-12-15 LAB — TSH: TSH: 1.26 u[IU]/mL (ref 0.350–4.500)

## 2011-12-15 MED ORDER — WARFARIN SODIUM 5 MG PO TABS: 5.0000 mg | ORAL_TABLET | Freq: Once | ORAL | Status: DC

## 2011-12-15 MED ORDER — NITROGLYCERIN 2 % TD OINT
1.0000 [in_us] | TOPICAL_OINTMENT | Freq: Four times a day (QID) | TRANSDERMAL | Status: DC
  Administered 2011-12-15 (×2): 1 [in_us] via TOPICAL
  Filled 2011-12-15 (×2): qty 1

## 2011-12-15 MED ORDER — ASPIRIN 81 MG PO TBEC: 81.0000 mg | DELAYED_RELEASE_TABLET | Freq: Every day | ORAL | Status: DC

## 2011-12-15 NOTE — H&P (Addendum)
[facility] History and Physical  [name] [id number] DOB: November 02, 1926 DOA: 12/14/2011   PCP:   Anthoney Harada, MD  Cardiologist: Dr. Loletha Grayer., at [facility]  Chief Complaint:  Chest pain since this afternoon  HPI: Kimberly [name] is an 78 y.o. female.   Caucasian lady who lives alone, history includes fibromyalgia, depression, atrial fibrillation; she is status post pacemaker. Patient has visited the emergency room 7 times in the past 7 weeks, with varus complaints.  Today patient reports that on arriving home from church, before she could get out of her car and she had sudden onset of central and precordial chest pain 10 out of 10 in intensity, throbbing associated with nausea dizziness diaphoresis and shortness of breath. She felt as if she was in the past felt but was able to get out of her car and getting to the halls without much difficulty, and pressed her recently-acquired Lifealert button. Prior to the arrival of EMS patient put 3 Tablets of l nitroglycerin under her tongue and reports it made her head start to throb and she felt even more dizzy. Although she said it relieved the pain a little she still reports it was a 10 out of 10. Patient is very talkative, is accompanied by 2 friends to the emergency room, and laughs as she gives various anecdotes during the interview, yet maintains that her pain is 10 out of 10 but she has a very high pain threshold.  She gives no previous history of coronary artery disease or history of cardiac stress testing.  She recounts some various unfortunate incidences in her life, such as the fact that her children don't talk to her, and her husband was " taken away", after she put him in a nursing home because she couldn't care for him, one of her children assumed guardianship and took him to live with her in another state. She is quite upset about this. She she made a suicidal gesture when this happened about 7 months ago  overdosing with Xanax, but she said it only put her to sleep and she was still alive when she woke up. She denies current suicidal ideation  She is on warfarin for atrial fibrillation, but does not know her INR goals, does not know the exact doses of her warfarin, as says she has them written down on a piece of paper and she does follow some exactlt - the doses are different every day and seemed to vera between 3 mg and 4.5. She gets her INR checked every week. She bumps into furniture a lot and has multiple bruises but says she has not been discussing this with her primary care physician.  Rewiew of Systems:   All systems negative except as marked bold or noted in the HPI;  Constitutional: Negative for malaise, fever and chills. ;  Eyes: Negative for eye pain, redness and discharge. ;  ENMT: Negative for ear pain, hoarseness, nasal congestion, sinus pressure and sore throat. ;  Cardiovascular: Negative for  and peripheral edema. ; chest pain, palpitations, diaphoresis, dyspnea Respiratory: Negative for cough, hemoptysis, wheezing and stridor. ;  Gastrointestinal: Negative for vomiting, diarrhea, constipation, abdominal pain, melena, blood in stool, hematemesis, jaundice and rectal bleeding. unusual weight loss.. nausea Genitourinary: Negative for frequency, dysuria, incontinence,flank pain and hematuria;  Musculoskeletal: Negative for back pain and neck pain. Negative for swelling and trauma.;  Skin: . Negative for pruritus, rash, abrasions, bruising and skin lesion.; ulcerations  Neuro: Negative for headache, lightheadedness  and neck stiffness. Negative for weakness, altered level of consciousness , altered mental status, extremity weakness, burning feet, involuntary movement, seizure and syncope.  Psych: negative for anxiety,  hallucinations, paranoia, suicidal or homicidal ideation. depression, insomnia, tearfulness, panic attacks.     Past Medical History  Diagnosis Date  . Depression     . Atrial fibrillation   . Scoliosis   . Osteoarthritis   . Pacemaker   . Fibromyalgia   . Hypertension     Past Surgical History  Procedure Date  . Pacemaker insertion   . Hernia repair   . Abdominal hysterectomy   . Cholecystectomy   . Appendectomy   . Breast surgery   . Back surgery   . Neck surgery     Medications:  HOME MEDS: Prior to Admission medications   Medication Sig Start Date End Date Taking? Authorizing Provider  ARIPiprazole (ABILIFY) 2 MG tablet Take 2 mg by mouth every evening. 11/27/11 12/27/11 Yes Abran Richard, PA-C  carvedilol (COREG) 6.25 MG tablet Take 6.25 mg by mouth 2 (two) times daily with a meal.     Yes Historical Provider, MD  diphenhydrAMINE (BENADRYL) 25 MG tablet Take 1 tablet (25 mg total) by mouth every 6 (six) hours. 12/09/11 01/08/12 Yes Teressa Lower, MD  FeFum-FePoly-FA-B Cmp-C-Biot [facility]) CAPS Take 1 capsule by mouth daily.   Yes Historical Provider, MD  furosemide (LASIX) 40 MG tablet Take 40 mg by mouth every morning.    Yes Historical Provider, MD  HYDROcodone-acetaminophen (VICODIN) 5-500 MG per tablet Take 1 tablet by mouth every 4 (four) hours as needed. pain   Yes Historical Provider, MD  potassium chloride (K-DUR) 10 MEQ tablet Take 1 tablet (10 mEq total) by mouth 2 (two) times daily. 11/27/11 11/26/12 Yes Abran Richard, PA-C  predniSONE (DELTASONE) 20 MG tablet Take 2 tablets (40 mg total) by mouth daily. 12/09/11 12/19/11 Yes Teressa Lower, MD  temazepam (RESTORIL) 30 MG capsule Take 30 mg by mouth at bedtime.    Yes Historical Provider, MD  warfarin (COUMADIN) 3 MG tablet Take 3-4.5 mg by mouth daily. Last 3 days of doses have been on Thursday 3 mg , Friday 4.5 mg , Saturday 3 mg.   Yes Historical Provider, MD     Allergies:  Allergies  Allergen Reactions  . Iodine Other (See Comments)    REACTION:If injected,  Rash/irritated skin reaction "welts"  . Papaya Derivatives Hives  . Penicillins Hives  . Sulfa Antibiotics  Rash    Social History:   reports that she quit smoking about 7 years ago. She does not have any smokeless tobacco history on file. She reports that she drinks about .6 ounces of alcohol per week. She reports that she does not use illicit drugs.  Family History: Family History  Problem Relation Age of Onset  . Cancer Sister   . Asthma Sister   . Heart failure Brother      Physical Exam: Filed Vitals:   12/14/11 2030 12/14/11 2200 12/14/11 2213 12/14/11 2300  BP: 161/67 176/91 145/78   Pulse:  73 71 75  Temp:  97.9 F (36.6 C) 98.3 F (36.8 C)   TempSrc:  Oral Oral   Resp: 16 20 18 20   Height:      Weight:      SpO2:  99% 97% 98%   Blood pressure 145/78, pulse 75, temperature 98.3 F (36.8 C), temperature source Oral, resp. rate 20, height 5\' 7"  (1.702 m), weight 61.236 kg (135 lb), SpO2  98.00%.  GEN:  Pleasant elderly Caucasian lady in the stretcher in no acute distress; anxious and very talkative; cooperative with exam PSYCH:  alert and oriented ;; affect is appropriate. HEENT: Mucous membranes pink and anicteric; PERRLA; EOM intact; no cervical lymphadenopathy nor thyromegaly or carotid bruit; no JVD; Breasts:: Not examined CHEST WALL: No tenderness CHEST: Normal respiration, clear to auscultation bilaterally HEART: Regular rate and rhythm; no murmurs rubs or gallops BACK: Moderate kyphosis, moderate scoliosis; no CVA tenderness ABDOMEN:  soft non-tender; no masses, no organomegaly, normal abdominal bowel sounds; no pannus; no intertriginous candida. Rectal Exam: Not done EXTREMITIES: age-appropriate arthropathy of the hands and knees; no edema; no ulcerations. Genitalia: not examined PULSES: 2+ and symmetric SKIN: Normal hydration no rash or ulceration CNS: Cranial nerves 2-12 grossly intact no focal lateralizing neurologic deficit   Labs on Admission:  Basic Metabolic Panel:  Lab [id number] 1555 12/11/11 1239  NA 137 135  K 3.0* 4.1  CL 98 99  CO2 27 28    GLUCOSE 90 121*  BUN 19 20  CREATININE 1.14* 1.05  CALCIUM 9.4 10.0  MG -- --  PHOS -- --   Liver Function Tests: No results found for this basename: AST:5,ALT:5,ALKPHOS:5,BILITOT:5,PROT:5,ALBUMIN:5 in the last 168 hours No results found for this basename: LIPASE:5,AMYLASE:5 in the last 168 hours No results found for this basename: AMMONIA:5 in the last 168 hours CBC:  Lab 12/14/11 1555 12/11/11 1239  WBC 10.5 10.2  NEUTROABS 8.0* 9.1*  HGB 11.2* 10.6*  HCT 31.9* 30.7*  MCV 101.6* 102.7*  PLT 270 245   Cardiac Enzymes:  Lab 12/14/11 1844 12/14/11 1555  CKTOTAL 43 --  CKMB 2.6 --  CKMBINDEX -- --  TROPONINI <0.30 <0.30   BNP: No components found with this basename: POCBNP:5 CBG: No results found for this basename: GLUCAP:5 in the last 168 hours  Results for orders placed during the hospital encounter of 12/14/11 (from the past 48 hour(s))  CBC WITH DIFFERENTIAL     Status: Abnormal   Collection Time   12/14/11  3:55 PM      Component Value Range Comment   WBC 10.5  4.0 - 10.5 K/uL    RBC 3.14 (*) 3.87 - 5.11 MIL/uL    Hemoglobin 11.2 (*) 12.0 - 15.0 g/dL    HCT 31.9 (*) 36.0 - 46.0 %    MCV 101.6 (*) 78.0 - 100.0 fL    MCH 35.7 (*) 26.0 - 34.0 pg    MCHC 35.1  30.0 - 36.0 g/dL    RDW 14.0  11.5 - 15.5 %    Platelets 270  150 - 400 K/uL    Neutrophils Relative 77  43 - 77 %    Neutro Abs 8.0 (*) 1.7 - 7.7 K/uL    Lymphocytes Relative 16  12 - 46 %    Lymphs Abs 1.6  0.7 - 4.0 K/uL    Monocytes Relative 7  3 - 12 %    Monocytes Absolute 0.8  0.1 - 1.0 K/uL    Eosinophils Relative 0  0 - 5 %    Eosinophils Absolute 0.0  0.0 - 0.7 K/uL    Basophils Relative 0  0 - 1 %    Basophils Absolute 0.0  0.0 - 0.1 K/uL   BASIC METABOLIC PANEL     Status: Abnormal   Collection Time   12/14/11  3:55 PM      Component Value Range Comment   Sodium 137  135 - 145  mEq/L    Potassium 3.0 (*) 3.5 - 5.1 mEq/L    Chloride 98  96 - 112 mEq/L    CO2 27  19 - 32 mEq/L     Glucose, Bld 90  70 - 99 mg/dL    BUN 19  6 - 23 mg/dL    Creatinine, Ser 1.14 (*) 0.50 - 1.10 mg/dL    Calcium 9.4  8.4 - 10.5 mg/dL    GFR calc non Af Amer 44 (*) >90 mL/min    GFR calc Af Amer 50 (*) >90 mL/min   PROTIME-INR     Status: Abnormal   Collection Time   12/14/11  3:55 PM      Component Value Range Comment   Prothrombin Time 19.0 (*) 11.6 - 15.2 seconds    INR 1.56 (*) 0.00 - 1.49   TROPONIN I     Status: Normal   Collection Time   12/14/11  3:55 PM      Component Value Range Comment   Troponin I <0.30  <0.30 ng/mL   D-DIMER, QUANTITATIVE     Status: Abnormal   Collection Time   12/14/11  3:55 PM      Component Value Range Comment   D-Dimer, Quant 0.57 (*) 0.00 - 0.48 ug/mL-FEU   RETICULOCYTES     Status: Abnormal   Collection Time   12/14/11  3:55 PM      Component Value Range Comment   Retic Ct Pct 2.3  0.4 - 3.1 %    RBC. 3.17 (*) 3.87 - 5.11 MIL/uL    Retic Count, Manual 72.9  19.0 - 186.0 K/uL   TROPONIN I     Status: Normal   Collection Time   12/14/11  6:44 PM      Component Value Range Comment   Troponin I <0.30  <0.30 ng/mL   CK TOTAL AND CKMB     Status: Normal   Collection Time   12/14/11  6:44 PM      Component Value Range Comment   Total CK 43  7 - 177 U/L    CK, MB 2.6  0.3 - 4.0 ng/mL    Relative Index RELATIVE INDEX IS INVALID  0.0 - 2.5      Radiological Exams on Admission: Dg Chest Portable 1 View  12/14/2011  *RADIOLOGY REPORT*  Clinical Data: Chest pain  PORTABLE CHEST - 1 VIEW  Comparison: 12/02/2010  Findings: Hyperinflation.  Biapical pleural parenchymal scarring. No pleural effusion or pneumothorax.  Borderline cardiomegaly.  Left subclavian ICD.  Cervical spine fixation hardware.  IMPRESSION: No evidence of acute cardiopulmonary disease.  Original Report Authenticated By: Julian Hy, M.D.    EKG: Independently reviewed. Paced rhythm   Assessment/Plan Present on Admission:  .Chest pain at  rest .Pacemaker .Osteoarthritis .Hypertension .Fibromyalgia .Depression .Atrial fibrillation   PLAN: This lady gives a strong impression of being depressed and lonely, and likely some organized social interaction would decrease her visits to the emergency room and the preoccupation with her health. This lady likely also has some degree of dementia but this was not formally tested.  Her chest pain is very atypical, and does not appear to be 10 out of 10 she describes i, but will bring in on observation to cycle her cardiac enzymes, and consider consulting her cardiologist in the morning. Will also check a d-dimer and if elevated will consider a VQ scan of the lung to rule out embolus.   will have the pharmacist will ask  the pharmacy to dose her Coumadin and simplify the regimen.  Continue management of her chronic medical condition  Other plans as per orders.  Code Status: FULL CODE   Family Communication: Ronnie Doss, great-niece; [phone]);  [name] (daughter). [phone],  [phone])  Disposition Plan:    Amarie Viles Nocturnist [facility] Pager [phone]   12/15/2011, 1:47 AM

## 2011-12-15 NOTE — Care Management Note (Signed)
    Page 1 of 1   12/15/2011     11:25:45 AM   CARE MANAGEMENT NOTE 12/15/2011  Patient:  Kimberly [name], Kimberly [name]   Account Number:  [id number]  Date Initiated:  12/15/2011  Documentation initiated by:  Claretha Cooper  Subjective/Objective Assessment:   Pt admitted with chest pain. Lives alone and independent with ADL     Action/Plan:   Pt DC home and could benefit from assistance with understanding medications and keeping them straight.   Anticipated DC Date:  12/15/2011   Anticipated DC Plan:  [place]  CM consult      Choice offered to / List presented to:          [facility] arranged  HH-1 RN  [place].   Status of service:  Completed, signed off Medicare Important Message given?   (If response is "NO", the following Medicare IM given date fields will be blank) Date Medicare IM given:   Date Additional Medicare IM given:    Discharge Disposition:  [place]  Per UR Regulation:    If discussed at Long Length of Stay Meetings, dates discussed:    Comments:  12/15/11 [place]

## 2011-12-15 NOTE — Telephone Encounter (Signed)
Del.Chart to MD office

## 2011-12-15 NOTE — Clinical Social Work Note (Signed)
Called [facility] to transport patient home.  Rn asked to inform patient charge will be $3.  Justice Britain Clinical Social Worker [phone])

## 2011-12-15 NOTE — Telephone Encounter (Signed)
Referred by Ronnald Collum, NP Dx-[facility]. NP packet mailed out.

## 2011-12-15 NOTE — Clinical Social Work Note (Signed)
CSW received referral for assistance monitoring meds. CM aware and to follow. CSW can be reconsulted if needed.  Benay Pike, [place]

## 2011-12-15 NOTE — Progress Notes (Deleted)
Physician Discharge Summary  Kimberly [name] [id number] DOB: 10-21-1926 DOA: 12/14/2011  PCP: Anthoney Harada, MD  Admit date: 12/14/2011 Discharge date: 12/15/2011  Recommendations for Outpatient Follow-up:  1. Patient will be discharged home and will be asked to follow up with her cardiologist Dr. Sallyanne Kuster in 1 week 2. Follow up with primary doctor as scheduled tomorrow 3. She will be set up with home health nursing for medication compliance  Discharge Diagnoses:  Principal Problem:  *Chest pain at rest, likely due to anxiety Active Problems:  Osteoarthritis  Atrial fibrillation  Pacemaker  Fibromyalgia  Hypertension  Depression  Hypokalemia   Discharge Condition: improved  Diet recommendation: low salt  Filed Weights   12/14/11 1449 12/15/11 0500  Weight: 61.236 kg (135 lb) 62.5 kg (137 lb 12.6 oz)    History of present illness:  Kimberly [name] is an 78 y.o. female. Caucasian lady who lives alone, history includes fibromyalgia, depression, atrial fibrillation; she is status post pacemaker. Patient has visited the emergency room 7 times in the past 7 weeks, with varus complaints.  Today patient reports that on arriving home from church, before she could get out of her car and she had sudden onset of central and precordial chest pain 10 out of 10 in intensity, throbbing associated with nausea dizziness diaphoresis and shortness of breath. She felt as if she was in the past felt but was able to get out of her car and getting to the halls without much difficulty, and pressed her recently-acquired Lifealert button. Prior to the arrival of EMS patient put 3 Tablets of l nitroglycerin under her tongue and reports it made her head start to throb and she felt even more dizzy. Although she said it relieved the pain a little she still reports it was a 10 out of 10. Patient is very talkative, is accompanied by 2 friends to the emergency room, and laughs as she gives various anecdotes  during the interview, yet maintains that her pain is 10 out of 10 but she has a very high pain threshold.  She gives no previous history of coronary artery disease or history of cardiac stress testing.  She recounts some various unfortunate incidences in her life, such as the fact that her children don't talk to her, and her husband was " taken away", after she put him in a nursing home because she couldn't care for him, one of her children assumed guardianship and took him to live with her in another state. She is quite upset about this. She she made a suicidal gesture when this happened about 7 months ago overdosing with Xanax, but she said it only put her to sleep and she was still alive when she woke up. She denies current suicidal ideation  She is on warfarin for atrial fibrillation, but does not know her INR goals, does not know the exact doses of her warfarin, as says she has them written down on a piece of paper and she does follow some exactlt - the doses are different every day and seemed to vera between 3 mg and 4.5. She gets her INR checked every week. She bumps into furniture a lot and has multiple bruises but says she has not been discussing this with her primary care physician.   Hospital Course:  This lady was admitted to the hospital with complaints of chest pain.  Her pain was very atypical.  She ruled out for MI with negative cardiac enzymes.  She reports that her pain  has resolved since being admitted.  She describes many stressors in her life and also feels that her pain may be related to anxiety and stress.  She feels quite well today, is very talkative and feels that she is ready to go home. She was advised to follow up with her cardiologist for further stress testing as felt appropriate.  She was started on aspirin.  She will be set up with home health nursing for medication compliance.  Procedures:  none  Consultations:  none  Discharge Exam: Filed Vitals:   12/15/11 0500    BP: 144/63  Pulse: 69  Temp: 98.1 F (36.7 C)  Resp: 20   Filed Vitals:   12/14/11 2200 12/14/11 2213 12/14/11 2300 12/15/11 0500  BP: 176/91 145/78  144/63  Pulse: 73 71 75 69  Temp: 97.9 F (36.6 C) 98.3 F (36.8 C)  98.1 F (36.7 C)  TempSrc: Oral Oral  Oral  Resp: 20 18 20 20   Height:      Weight:    62.5 kg (137 lb 12.6 oz)  SpO2: 99% 97% 98% 97%    General: NAD Cardiovascular: s1, s2, rrr, 1+ edema in LE B/L Respiratory: cta b  Discharge Instructions  Discharge Orders    Future Appointments: Provider: Department: Dept Phone: Center:   12/24/2011 1:00 PM [facility] [phone] None   12/24/2011 1:30 PM Nira Retort, MD [facility] [phone] None     Future Orders Please Complete By Expires   Diet - low sodium heart healthy      Home Health      Comments:   Medication compliance   Questions: Responses:   To provide the following care/treatments RN   Increase activity slowly      Call MD for:  difficulty breathing, headache or visual disturbances      Call MD for:  severe uncontrolled pain        Medication List  As of 12/15/2011 10:32 AM   TAKE these medications         ARIPiprazole 2 MG tablet   Commonly known as: ABILIFY   Take 2 mg by mouth every evening.      aspirin 81 MG EC tablet   Take 1 tablet (81 mg total) by mouth daily.      carvedilol 6.25 MG tablet   Commonly known as: COREG   Take 6.25 mg by mouth 2 (two) times daily with a meal.      diphenhydrAMINE 25 MG tablet   Commonly known as: BENADRYL   Take 1 tablet (25 mg total) by mouth every 6 (six) hours.      FOLIVANE-PLUS Caps   Take 1 capsule by mouth daily.      furosemide 40 MG tablet   Commonly known as: LASIX   Take 40 mg by mouth every morning.      HYDROcodone-acetaminophen 5-500 MG per tablet   Commonly known as: VICODIN   Take 1 tablet by mouth every 4 (four) hours as needed. pain      potassium chloride 10 MEQ  tablet   Commonly known as: K-DUR   Take 1 tablet (10 mEq total) by mouth 2 (two) times daily.      predniSONE 20 MG tablet   Commonly known as: DELTASONE   Take 2 tablets (40 mg total) by mouth daily.      temazepam 30 MG capsule   Commonly known as: RESTORIL   Take 30 mg by mouth  at bedtime.      warfarin 3 MG tablet   Commonly known as: COUMADIN   Take 3-4.5 mg by mouth daily. Last 3 days of doses have been on Thursday 3 mg , Friday 4.5 mg , Saturday 3 mg.           Follow-up Information    Follow up with Anthoney Harada, MD. (as scheduled)    Contact information:   [address] [place] [phone]       Follow up with Sanda Klein, MD. Schedule an appointment as soon as possible for a visit in 1 week.   Contact information:   [address] [place] [phone]           The results of significant diagnostics from this hospitalization (including imaging, microbiology, ancillary and laboratory) are listed below for reference.    Significant Diagnostic Studies: Dg Chest 2 View  12/02/2011  *RADIOLOGY REPORT*  Clinical Data: Leg swelling.  Shortness of breath.  Atrial fibrillation.  CHEST - 2 VIEW  Comparison: 11/27/2011  Findings: The patient has left-sided transvenous pacemaker / AICD. Leads overlie the coronary sinus, right ventricle.  The heart is enlarged.  Lungs are hyperinflated.  There are no focal consolidations or pleural effusions.  No edema.  Patient has had prior lower cervical fusion.  IMPRESSION:  1.  Cardiomegaly without pulmonary edema. 2.  Hyperinflation without focal pulmonary abnormality.  Original Report Authenticated By: Glenice Bow, M.D.   Dg Chest 2 View  11/27/2011  *RADIOLOGY REPORT*  Clinical Data: Short of breath, weakness  CHEST - 2 VIEW  Comparison: Chest radiograph 01/22/2011  Findings: Left-sided pacemaker with three continuous  leads overlies the heart silhouette.  Lungs are hyperinflated.  No effusion, infiltrate, or pneumothorax.  IMPRESSION:  1. No acute cardiopulmonary process. 2.  Hyperinflated lungs.  Original Report Authenticated By: Suzy Bouchard, M.D.   Ct Head Wo Contrast  11/27/2011  *RADIOLOGY REPORT*  Clinical Data: Confusion, crying, posterior, extremity weakness  CT HEAD WITHOUT CONTRAST  Technique:  Contiguous axial images were obtained from the base of the skull through the vertex without contrast.  Comparison: Head CT 03/20/2009  Findings: No acute intracranial hemorrhage.  No focal mass lesion. No CT evidence of acute infarction.   No midline shift or mass effect.  No hydrocephalus.  Basilar cisterns are patent. Paranasal sinuses and mastoid air cells are clear.  Orbits are normal. Mild cortical atrophy.  IMPRESSION: No acute intracranial findings.  Original Report Authenticated By: Suzy Bouchard, M.D.   Dg Chest Portable 1 View  12/14/2011  *RADIOLOGY REPORT*  Clinical Data: Chest pain  PORTABLE CHEST - 1 VIEW  Comparison: 12/02/2010  Findings: Hyperinflation.  Biapical pleural parenchymal scarring. No pleural effusion or pneumothorax.  Borderline cardiomegaly.  Left subclavian ICD.  Cervical spine fixation hardware.  IMPRESSION: No evidence of acute cardiopulmonary disease.  Original Report Authenticated By: Julian Hy, M.D.    Microbiology: No results found for this or any previous visit (from the past 240 hour(s)).   Labs: Basic Metabolic Panel:  Lab [id number] 0234 12/14/11 1555 12/11/11 1239  NA 136 137 135  K 4.2 3.0* 4.1  CL 99 98 99  CO2 30 27 28   GLUCOSE 87 90 121*  BUN 20 19 20   CREATININE 1.26* 1.14* 1.05  CALCIUM 9.1 9.4 10.0  MG -- -- --  PHOS -- -- --   Liver Function Tests:  Lab  12/15/11 0234  AST 19  ALT 15  ALKPHOS 41  BILITOT 0.3  PROT 6.0  ALBUMIN 3.2*   No results found for this basename: LIPASE:5,AMYLASE:5 in the last 168 hours No results found for this  basename: AMMONIA:5 in the last 168 hours CBC:  Lab 12/15/11 0234 12/14/11 1555 12/11/11 1239  WBC 7.8 10.5 10.2  NEUTROABS -- 8.0* 9.1*  HGB 10.3* 11.2* 10.6*  HCT 29.9* 31.9* 30.7*  MCV 103.5* 101.6* 102.7*  PLT 216 270 245   Cardiac Enzymes:  Lab 12/15/11 0234 12/14/11 1844 12/14/11 1555  CKTOTAL 34 43 --  CKMB 2.3 2.6 --  CKMBINDEX -- -- --  TROPONINI <0.30 <0.30 <0.30   BNP: BNP (last 3 results)  Basename 11/27/11 0726  PROBNP 1500.0*   CBG: No results found for this basename: GLUCAP:5 in the last 168 hours  Time coordinating discharge: greater than 30 minutes  Signed:  MEMON,JEHANZEB  [facility] 12/15/2011, 10:32 AM

## 2011-12-15 NOTE — Progress Notes (Signed)
[place] for Coumadin Indication: afib  Allergies  Allergen Reactions  . Iodine Other (See Comments)    REACTION:If injected,  Rash/irritated skin reaction "welts"  . Papaya Derivatives Hives  . Penicillins Hives  . Sulfa Antibiotics Rash    Patient Measurements: Height: 5\' 7"  (170.2 cm) Weight: 137 lb 12.6 oz (62.5 kg) IBW/kg (Calculated) : 61.6    Vital Signs: Temp: 98.1 F (36.7 C) (08/12 0500) Temp src: Oral (08/12 0500) BP: 144/63 mmHg (08/12 0500) Pulse Rate: 69  (08/12 0500)  Labs:  Basename 12/15/11 0234 12/14/11 1844 12/14/11 1555  HGB 10.3* -- 11.2*  HCT 29.9* -- 31.9*  PLT 216 -- 270  APTT -- -- --  LABPROT 18.0* -- 19.0*  INR 1.46 -- 1.56*  HEPARINUNFRC -- -- --  CREATININE 1.26* -- 1.14*  CKTOTAL 34 43 --  CKMB 2.3 2.6 --  TROPONINI <0.30 <0.30 <0.30    Estimated Creatinine Clearance: 33.5 ml/min (by C-G formula based on Cr of 1.26).   Medical History: Past Medical History  Diagnosis Date  . Depression   . Atrial fibrillation   . Scoliosis   . Osteoarthritis   . Pacemaker   . Fibromyalgia   . Hypertension     Medications:  Scheduled:     . ARIPiprazole  2 mg Oral QPM  . aspirin  324 mg Oral Once  . aspirin EC  81 mg Oral Daily  . carvedilol  6.25 mg Oral BID WC  . diphenhydrAMINE  25 mg Oral Q6H  . furosemide  40 mg Oral Daily  . morphine  4 mg Intravenous Once  . nitroGLYCERIN  1 inch Topical Once  . nitroGLYCERIN  1 inch Topical Q6H  . nitroGLYCERIN  0.4 mg Sublingual Once  . pantoprazole  40 mg Oral BID AC  . potassium chloride  20 mEq Oral BID  . potassium chloride SA  40 mEq Oral Once  . predniSONE  40 mg Oral Q breakfast  . temazepam  30 mg Oral QHS  . warfarin  3 mg Oral Once  . warfarin  5 mg Oral ONCE-1800  . Warfarin - Pharmacist Dosing Inpatient   Does not apply q1800    Assessment: patient's home warfarin dosing regimen varies but last dose taken 24hr PTA and INR is sub  therapeutic on admission.  Goal of Therapy:  INR 2-3   Plan: Coumadin 5mg  today x 1 dose INR daily  [place] Crane, Lissa Rowles A 12/15/2011,8:20 AM

## 2011-12-24 ENCOUNTER — Ambulatory Visit: Payer: Medicare Other | Admitting: Hematology and Oncology

## 2011-12-24 ENCOUNTER — Ambulatory Visit: Payer: Medicare Other

## 2011-12-24 NOTE — Discharge Summary (Signed)
Physician Discharge Summary    ELIJAH HIRTZ [id number] DOB: [birth year] DOA: 12/14/2011   PCP: Anthoney Harada, MD   Admit date: 12/14/2011  Discharge date: 12/15/2011   Recommendations for Outpatient Follow-up:  1. Patient will be discharged home and will be asked to follow up with her cardiologist Dr. Sallyanne Kuster in 1 week 2. Follow up with primary doctor as scheduled tomorrow 3. She will be set up with home health nursing for medication compliance  Discharge Diagnoses:  Principal Problem:  *Chest pain at rest, likely due to anxiety  Active Problems:  Osteoarthritis  Atrial fibrillation  Pacemaker  Fibromyalgia  Hypertension  Depression  Hypokalemia   Discharge Condition: improved   Diet recommendation: low salt  Filed Weights    12/14/11 1449  12/15/11 0500   Weight:  61.236 kg (135 lb)  62.5 kg (137 lb 12.6 oz)     History of present illness:  Kimberly [name] is an 78 y.o. female. Caucasian lady who lives alone, history includes fibromyalgia, depression, atrial fibrillation; she is status post pacemaker. Patient has visited the emergency room 7 times in the past 7 weeks, with varus complaints.  Today patient reports that on arriving home from church, before she could get out of her car and she had sudden onset of central and precordial chest pain 10 out of 10 in intensity, throbbing associated with nausea dizziness diaphoresis and shortness of breath. She felt as if she was in the past felt but was able to get out of her car and getting to the halls without much difficulty, and pressed her recently-acquired Lifealert button. Prior to the arrival of EMS patient put 3 Tablets of l nitroglycerin under her tongue and reports it made her head start to throb and she felt even more dizzy. Although she said it relieved the pain a little she still reports it was a 10 out of 10. Patient is very talkative, is accompanied by 2 friends to the emergency room, and laughs as she gives various  anecdotes during the interview, yet maintains that her pain is 10 out of 10 but she has a very high pain threshold.  She gives no previous history of coronary artery disease or history of cardiac stress testing.  She recounts some various unfortunate incidences in her life, such as the fact that her children don't talk to her, and her husband was " taken away", after she put him in a nursing home because she couldn't care for him, one of her children assumed guardianship and took him to live with her in another state. She is quite upset about this. She she made a suicidal gesture when this happened about 7 months ago overdosing with Xanax, but she said it only put her to sleep and she was still alive when she woke up. She denies current suicidal ideation  She is on warfarin for atrial fibrillation, but does not know her INR goals, does not know the exact doses of her warfarin, as says she has them written down on a piece of paper and she does follow some exactlt - the doses are different every day and seemed to vera between 3 mg and 4.5. She gets her INR checked every week. She bumps into furniture a lot and has multiple bruises but says she has not been discussing this with her primary care physician.   Hospital Course:  This lady was admitted to the hospital with complaints of chest pain. Her pain was very atypical. She ruled out  for MI with negative cardiac enzymes. She reports that her pain has resolved since being admitted. She describes many stressors in her life and also feels that her pain may be related to anxiety and stress. She feels quite well today, is very talkative and feels that she is ready to go home. She was advised to follow up with her cardiologist for further stress testing as felt appropriate. She was started on aspirin. She will be set up with home health nursing for medication compliance.   Procedures:  none  Consultations:  none  Discharge Exam:  Filed Vitals:    12/15/11  0500   BP:  144/63   Pulse:  69   Temp:  98.1 F (36.7 C)   Resp:  20    Filed Vitals:    12/14/11 2200  12/14/11 2213  12/14/11 2300  12/15/11 0500   BP:  176/91  145/78   144/63   Pulse:  73  71  75  69   Temp:  97.9 F (36.6 C)  98.3 F (36.8 C)   98.1 F (36.7 C)   TempSrc:  Oral  Oral   Oral   Resp:  20  18  20  20    Height:       Weight:     62.5 kg (137 lb 12.6 oz)   SpO2:  99%  97%  98%  97%    General: NAD  Cardiovascular: s1, s2, rrr, 1+ edema in LE B/L  Respiratory: cta b   Discharge Instructions  Discharge Orders    Future Appointments:  Provider:  Department:  Dept Phone:  Center:    12/24/2011 1:00 PM  [facility]  [facility]  [phone]  None    12/24/2011 1:30 PM  Nira Retort, MD  [facility]  [phone]  None      Future Orders  Please Complete By  Expires    Diet - low sodium heart healthy      Home Health      Comments:    Medication compliance    Questions:  Responses:    To provide the following care/treatments  RN    Increase activity slowly      Call MD for: difficulty breathing, headache or visual disturbances      Call MD for: severe uncontrolled pain        Medication List  As of 12/15/2011 10:32 AM    TAKE these medications          ARIPiprazole 2 MG tablet      Commonly known as: ABILIFY      Take 2 mg by mouth every evening.      aspirin 81 MG EC tablet      Take 1 tablet (81 mg total) by mouth daily.      carvedilol 6.25 MG tablet      Commonly known as: COREG      Take 6.25 mg by mouth 2 (two) times daily with a meal.      diphenhydrAMINE 25 MG tablet      Commonly known as: BENADRYL      Take 1 tablet (25 mg total) by mouth every 6 (six) hours.      FOLIVANE-PLUS Caps      Take 1 capsule by mouth daily.      furosemide 40 MG tablet      Commonly known as: LASIX      Take 40 mg by mouth every morning.  HYDROcodone-acetaminophen 5-500 MG per tablet      Commonly known as: VICODIN        Take 1 tablet by mouth every 4 (four) hours as needed. pain      potassium chloride 10 MEQ tablet      Commonly known as: K-DUR      Take 1 tablet (10 mEq total) by mouth 2 (two) times daily.      predniSONE 20 MG tablet      Commonly known as: DELTASONE      Take 2 tablets (40 mg total) by mouth daily.      temazepam 30 MG capsule      Commonly known as: RESTORIL      Take 30 mg by mouth at bedtime.      warfarin 3 MG tablet      Commonly known as: COUMADIN      Take 3-4.5 mg by mouth daily. Last 3 days of doses have been on Thursday 3 mg , Friday 4.5 mg , Saturday 3 mg.         Follow-up Information    Follow up with Anthoney Harada, MD. (as scheduled)    Contact information:    [address]  [place]  [phone]       Follow up with Sanda Klein, MD. Schedule an appointment as soon as possible for a visit in 1 week.    Contact information:    [address]  [place]  [place]  [phone]         The results of significant diagnostics from this hospitalization (including imaging, microbiology, ancillary and laboratory) are listed below for reference.    Significant Diagnostic Studies:  Dg Chest 2 View  12/02/2011 *RADIOLOGY REPORT* Clinical Data: Leg swelling. Shortness of breath. Atrial fibrillation. CHEST - 2 VIEW Comparison: 11/27/2011 Findings: The patient has left-sided transvenous pacemaker / AICD. Leads overlie the coronary sinus, right ventricle. The heart is enlarged. Lungs are hyperinflated. There are no focal consolidations or pleural effusions. No edema. Patient has had prior lower cervical fusion. IMPRESSION: 1. Cardiomegaly without pulmonary edema. 2. Hyperinflation without focal pulmonary abnormality. Original Report Authenticated By: Glenice Bow, M.D.  Dg Chest 2 View  11/27/2011 *RADIOLOGY REPORT* Clinical Data: Short of breath, weakness CHEST - 2 VIEW  Comparison: Chest radiograph 01/22/2011 Findings: Left-sided pacemaker with three continuous leads overlies the heart silhouette. Lungs are hyperinflated. No effusion, infiltrate, or pneumothorax. IMPRESSION: 1. No acute cardiopulmonary process. 2. Hyperinflated lungs. Original Report Authenticated By: Suzy Bouchard, M.D.  Ct Head Wo Contrast  11/27/2011 *RADIOLOGY REPORT* Clinical Data: Confusion, crying, posterior, extremity weakness CT HEAD WITHOUT CONTRAST Technique: Contiguous axial images were obtained from the base of the skull through the vertex without contrast. Comparison: Head CT 03/20/2009 Findings: No acute intracranial hemorrhage. No focal mass lesion. No CT evidence of acute infarction. No midline shift or mass effect. No hydrocephalus. Basilar cisterns are patent. Paranasal sinuses and mastoid air cells are clear. Orbits are normal. Mild cortical atrophy. IMPRESSION: No acute intracranial findings. Original Report Authenticated By: Suzy Bouchard, M.D.  Dg Chest Portable 1 View  12/14/2011 *RADIOLOGY REPORT* Clinical Data: Chest pain PORTABLE CHEST - 1 VIEW Comparison: 12/02/2010 Findings: Hyperinflation. Biapical pleural parenchymal scarring. No pleural effusion or pneumothorax. Borderline cardiomegaly. Left subclavian ICD. Cervical spine fixation hardware. IMPRESSION: No evidence of acute cardiopulmonary disease. Original Report Authenticated By: Julian Hy, M.D.   Microbiology:  No  results found for this or any previous visit (from the past 240 hour(s)).   Labs:  Basic Metabolic Panel:   Lab  [id number]  12/14/11 1555  12/11/11 1239   NA  136  137  135   K  4.2  3.0*  4.1   CL  99  98  99   CO2  30  27  28    GLUCOSE  87  90  121*   BUN  20  19  20    CREATININE  1.26*  1.14*  1.05   CALCIUM  9.1  9.4  10.0   MG  --  --  --   PHOS  --  --  --    Liver Function Tests:   Lab  12/15/11 0234   AST  19   ALT  15   ALKPHOS  41   BILITOT  0.3   PROT  6.0   ALBUMIN   3.2*    No results found for this basename: LIPASE:5,AMYLASE:5 in the last 168 hours  No results found for this basename: AMMONIA:5 in the last 168 hours  CBC:   Lab  12/15/11 0234  12/14/11 1555  12/11/11 1239   WBC  7.8  10.5  10.2   NEUTROABS  --  8.0*  9.1*   HGB  10.3*  11.2*  10.6*   HCT  29.9*  31.9*  30.7*   MCV  103.5*  101.6*  102.7*   PLT  216  270  245    Cardiac Enzymes:   Lab  12/15/11 0234  12/14/11 1844  12/14/11 1555   CKTOTAL  34  43  --   CKMB  2.3  2.6  --   CKMBINDEX  --  --  --   TROPONINI  <0.30  <0.30  <0.30    BNP:  BNP (last 3 results)   Basename  11/27/11 0726   PROBNP  1500.0*    CBG:  No results found for this basename: GLUCAP:5 in the last 168 hours   Time coordinating discharge: greater than 30 minutes   Signed:   MEMON,JEHANZEB  [facility]  12/15/2011, 10:32 AM

## 2012-02-12 ENCOUNTER — Emergency Department (HOSPITAL_COMMUNITY): Payer: Medicare Other

## 2012-02-12 ENCOUNTER — Emergency Department (HOSPITAL_COMMUNITY)
Admission: EM | Admit: 2012-02-12 | Discharge: 2012-02-12 | Disposition: A | Discharge status: Home Health | Payer: Medicare Other | Attending: Emergency Medicine | Admitting: Emergency Medicine

## 2012-02-12 ENCOUNTER — Encounter (HOSPITAL_COMMUNITY): Payer: Self-pay | Admitting: Emergency Medicine

## 2012-02-12 DIAGNOSIS — I10 Essential (primary) hypertension: Secondary | ICD-10-CM | POA: Insufficient documentation

## 2012-02-12 DIAGNOSIS — Z87891 Personal history of nicotine dependence: Secondary | ICD-10-CM | POA: Insufficient documentation

## 2012-02-12 DIAGNOSIS — R0789 Other chest pain: Secondary | ICD-10-CM

## 2012-02-12 DIAGNOSIS — M1990 Unspecified osteoarthritis, unspecified site: Secondary | ICD-10-CM | POA: Insufficient documentation

## 2012-02-12 DIAGNOSIS — Z950 Presence of cardiac pacemaker: Secondary | ICD-10-CM | POA: Insufficient documentation

## 2012-02-12 DIAGNOSIS — M4120 Other idiopathic scoliosis, site unspecified: Secondary | ICD-10-CM | POA: Insufficient documentation

## 2012-02-12 DIAGNOSIS — I4891 Unspecified atrial fibrillation: Secondary | ICD-10-CM | POA: Insufficient documentation

## 2012-02-12 DIAGNOSIS — IMO0001 Reserved for inherently not codable concepts without codable children: Secondary | ICD-10-CM | POA: Insufficient documentation

## 2012-02-12 LAB — CBC WITH DIFFERENTIAL/PLATELET
Eosinophils Absolute: 0.1 10*3/uL (ref 0.0–0.7)
Hemoglobin: 12.6 g/dL (ref 12.0–15.0)
Lymphocytes Relative: 12 % (ref 12–46)
Lymphs Abs: 1.1 10*3/uL (ref 0.7–4.0)
Monocytes Relative: 11 % (ref 3–12)
Neutro Abs: 7.1 10*3/uL (ref 1.7–7.7)
Neutrophils Relative %: 76 % (ref 43–77)
Platelets: 233 10*3/uL (ref 150–400)
RBC: 3.83 MIL/uL — ABNORMAL LOW (ref 3.87–5.11)
WBC: 9.3 10*3/uL (ref 4.0–10.5)

## 2012-02-12 LAB — COMPREHENSIVE METABOLIC PANEL
ALT: 12 U/L (ref 0–35)
Alkaline Phosphatase: 50 U/L (ref 39–117)
BUN: 30 mg/dL — ABNORMAL HIGH (ref 6–23)
Chloride: 91 mEq/L — ABNORMAL LOW (ref 96–112)
GFR calc Af Amer: 34 mL/min — ABNORMAL LOW (ref >90)
Glucose, Bld: 89 mg/dL (ref 70–99)
Potassium: 3 mEq/L — ABNORMAL LOW (ref 3.5–5.1)
Sodium: 133 mEq/L — ABNORMAL LOW (ref 135–145)
Total Bilirubin: 0.2 mg/dL — ABNORMAL LOW (ref 0.3–1.2)
Total Protein: 6.9 g/dL (ref 6.0–8.3)

## 2012-02-12 LAB — TROPONIN I: Troponin I: <0.3 ng/mL (ref <0.30)

## 2012-02-12 MED ORDER — POTASSIUM CHLORIDE ER 10 MEQ PO TBCR: 10.0000 meq | EXTENDED_RELEASE_TABLET | Freq: Two times a day (BID) | ORAL | Status: DC

## 2012-02-12 MED ORDER — POTASSIUM CHLORIDE CRYS ER 20 MEQ PO TBCR: 40.0000 meq | EXTENDED_RELEASE_TABLET | Freq: Once | ORAL | Status: DC

## 2012-02-12 MED ORDER — POTASSIUM CHLORIDE CRYS ER 20 MEQ PO TBCR
40.0000 meq | EXTENDED_RELEASE_TABLET | Freq: Once | ORAL | Status: DC
  Filled 2012-02-12: qty 2

## 2012-02-12 MED ORDER — POTASSIUM CHLORIDE ER 10 MEQ PO TBCR: 20.0000 meq | EXTENDED_RELEASE_TABLET | Freq: Two times a day (BID) | ORAL | Status: DC

## 2012-02-12 NOTE — ED Provider Notes (Signed)
History     CSN: [id number]  Arrival date & time 02/12/12  [id number]   First MD Initiated Contact with Patient 02/12/12 0531      Chief Complaint  Patient presents with  . Chest Pain    (Consider location/radiation/quality/duration/timing/severity/associated sxs/prior treatment) HPI Comments: Patient woke from sleep this morning at about 3 AM.  Shortly thereafter she developed tightness in her chest over the pacemaker site.  She tried ntg at home without much relief.  EMS arrived and transported her her to be evaluated.  She now tells me she is symptom-free.  She has been to the ER several times recently for similar symptoms.  I cannot see where any cause has been identified.      Patient is a 78 y.o. female presenting with chest pain. The history is provided by the patient.  Chest Pain Episode onset: 3 AM. Chest pain occurs constantly. The chest pain is resolved. Associated with: nothing. The severity of the pain is moderate. The quality of the pain is described as tightness. The pain does not radiate. Exacerbated by: nothing. She tried nitroglycerin for the symptoms.     Past Medical History  Diagnosis Date  . Depression   . Atrial fibrillation   . Scoliosis   . Osteoarthritis   . Pacemaker   . Fibromyalgia   . Hypertension     Past Surgical History  Procedure Date  . Pacemaker insertion   . Hernia repair   . Abdominal hysterectomy   . Cholecystectomy   . Appendectomy   . Breast surgery   . Back surgery   . Neck surgery     Family History  Problem Relation Age of Onset  . Cancer Sister   . Asthma Sister   . Heart failure Brother     History  Substance Use Topics  . Smoking status: Former Smoker    Quit date: 12/13/2004  . Smokeless tobacco: Not on file  . Alcohol Use: 0.6 oz/week    1 Glasses of wine per week     frequently    OB History    Grav Para Term Preterm Abortions TAB SAB Ect Mult Living   5 2 1 1 3  3   2       Review of Systems    Cardiovascular: Positive for chest pain.  All other systems reviewed and are negative.    Allergies  Iodine; Papaya derivatives; Penicillins; and Sulfa antibiotics  Home Medications   Current Outpatient Rx  Name Route Sig Dispense Refill  . ARIPIPRAZOLE 2 MG PO TABS Oral Take 2 mg by mouth every evening.    . ASPIRIN 81 MG PO TBEC Oral Take 1 tablet (81 mg total) by mouth daily. 30 tablet 1  . CARVEDILOL 6.25 MG PO TABS Oral Take 6.25 mg by mouth 2 (two) times daily with a meal.      . DIPHENHYDRAMINE HCL 25 MG PO TABS Oral Take 1 tablet (25 mg total) by mouth every 6 (six) hours. 20 tablet 0  . FOLIVANE-PLUS PO CAPS Oral Take 1 capsule by mouth daily.    . FUROSEMIDE 40 MG PO TABS Oral Take 40 mg by mouth every morning.     [name] HYDROCODONE-ACETAMINOPHEN 5-500 MG PO TABS Oral Take 1 tablet by mouth every 4 (four) hours as needed. pain    . POTASSIUM CHLORIDE ER 10 MEQ PO TBCR Oral Take 1 tablet (10 mEq total) by mouth 2 (two) times daily. 10 tablet 0  . TEMAZEPAM  30 MG PO CAPS Oral Take 30 mg by mouth at bedtime.     . WARFARIN SODIUM 3 MG PO TABS Oral Take 3-4.5 mg by mouth daily. Last 3 days of doses have been on Thursday 3 mg , Friday 4.5 mg , Saturday 3 mg.      BP 97/63  Pulse 70  Temp 97.5 F (36.4 C) (Axillary)  Resp 14  SpO2 95%  Physical Exam  Nursing note and vitals reviewed. Constitutional: She is oriented to person, place, and time. She appears well-developed and well-nourished. No distress.  HENT:  Head: Normocephalic and atraumatic.  Neck: Normal range of motion. Neck supple.  Cardiovascular: Normal rate and regular rhythm.  Exam reveals no gallop and no friction rub.   No murmur heard. Pulmonary/Chest: Effort normal and breath sounds normal. No respiratory distress. She has no wheezes.  Abdominal: Soft. Bowel sounds are normal. She exhibits no distension. There is no tenderness.  Musculoskeletal: Normal range of motion.  Neurological: She is alert and oriented  to person, place, and time.  Skin: Skin is warm and dry. She is not diaphoretic.    ED Course  Procedures (including critical care time)   Labs Reviewed  TROPONIN I  CBC WITH DIFFERENTIAL  COMPREHENSIVE METABOLIC PANEL   No results found.   No diagnosis found.   Date: 02/12/2012  Rate: 70  Rhythm: Ventricular Pacing  QRS Axis: indeterminate  Intervals: Indeterminate  ST/T Wave abnormalities: indeterminate  Conduction Disutrbances:none  Narrative Interpretation:   Old EKG Reviewed: unchanged    MDM  The patient presents here for evaluation of chest pain over her pacer site.  It appears well without evidence for redness or cellulitis.  The labs have returned showing no evidence for MI.  She is pain free at this point.  She has been admitted recently to [facility] for the same but no pathology was found.   She does not appear to require admission at this point.  My plan is to discharge her to home with follow up with her Cardiologist in [place].  She is in the process of arranging a ride to come and get her.        Veryl Speak, MD 02/12/12 0700

## 2012-02-12 NOTE — ED Notes (Signed)
Pt arrived by ems with c/o of chest pain since 0330. Took 3 SL nitro with no relief. Has been seen here in last couple months for same. ems 12 lead unremarkable. 20 G iv L ac. Pt takes blood thinners at home so asa was withheld. Pt is a&ox4. Pain 8/10

## 2012-08-04 ENCOUNTER — Telehealth: Payer: Self-pay | Admitting: Pharmacist

## 2012-08-04 NOTE — Telephone Encounter (Signed)
Considering Eliquis 2.5mg  BID for a.fib for Mrs. Defrancis but would like to make sure that her Scr and Hgb is stable before starting .  Last BMET and CBC were 07/13/2012. Called Arbie Cookey to see when she will be going back out to visit Mrs. Mahadevan. Suggested recheck CBC and BMET when she does.

## 2012-08-16 ENCOUNTER — Other Ambulatory Visit (INDEPENDENT_AMBULATORY_CARE_PROVIDER_SITE_OTHER): Payer: Medicare Other

## 2012-08-16 DIAGNOSIS — I4891 Unspecified atrial fibrillation: Secondary | ICD-10-CM

## 2012-08-16 LAB — CBC WITH DIFFERENTIAL/PLATELET
Basophils Relative: 0 % (ref 0–1)
Eosinophils Absolute: 0.1 10*3/uL (ref 0.0–0.7)
Eosinophils Relative: 2 % (ref 0–5)
HCT: 35.3 % — ABNORMAL LOW (ref 36.0–46.0)
Hemoglobin: 12 g/dL (ref 12.0–15.0)
Lymphs Abs: 1.7 10*3/uL (ref 0.7–4.0)
MCH: 32.4 pg (ref 26.0–34.0)
MCHC: 34 g/dL (ref 30.0–36.0)
MCV: 95.4 fL (ref 78.0–100.0)
Monocytes Absolute: 0.7 10*3/uL (ref 0.1–1.0)
Monocytes Relative: 9 % (ref 3–12)
Neutrophils Relative %: 68 % (ref 43–77)
RBC: 3.7 MIL/uL — ABNORMAL LOW (ref 3.87–5.11)

## 2012-08-16 LAB — BASIC METABOLIC PANEL
CO2: 27 mEq/L (ref 19–32)
Chloride: 96 mEq/L (ref 96–112)
Potassium: 4.1 mEq/L (ref 3.5–5.3)
Sodium: 133 mEq/L — ABNORMAL LOW (ref 135–145)

## 2012-08-16 NOTE — Progress Notes (Signed)
Nurse from another office dropped off blood work from [facility]

## 2012-09-01 ENCOUNTER — Telehealth: Payer: Self-pay | Admitting: Pharmacist

## 2012-09-01 DIAGNOSIS — I4891 Unspecified atrial fibrillation: Secondary | ICD-10-CM

## 2012-09-01 MED ORDER — APIXABAN 2.5 MG PO TABS: 2.5000 mg | ORAL_TABLET | Freq: Two times a day (BID) | ORAL | Status: DC

## 2012-09-01 NOTE — Telephone Encounter (Signed)
Since patient is unable to come into office for monthly INR checks will start eliquis 2.5mg  1 tablet po bid. Prior authorization has been approved. Pt. Requested called into [facility] mail order pharmacy - called  Instructed to discontinue ASA when Eliquis arrives Will have Bradd Canary, NP go out in 6 weeks to recheck CBC and BMET

## 2012-10-06 ENCOUNTER — Emergency Department (HOSPITAL_COMMUNITY): Payer: Medicare Other

## 2012-10-06 ENCOUNTER — Emergency Department (HOSPITAL_COMMUNITY)
Admission: EM | Admit: 2012-10-06 | Discharge: 2012-10-07 | Disposition: A | Discharge status: Home / Self Care | Payer: Medicare Other | Attending: Emergency Medicine | Admitting: Emergency Medicine

## 2012-10-06 ENCOUNTER — Encounter (HOSPITAL_COMMUNITY): Payer: Self-pay | Admitting: Emergency Medicine

## 2012-10-06 DIAGNOSIS — Z950 Presence of cardiac pacemaker: Secondary | ICD-10-CM | POA: Insufficient documentation

## 2012-10-06 DIAGNOSIS — Z8739 Personal history of other diseases of the musculoskeletal system and connective tissue: Secondary | ICD-10-CM | POA: Insufficient documentation

## 2012-10-06 DIAGNOSIS — Z87891 Personal history of nicotine dependence: Secondary | ICD-10-CM | POA: Insufficient documentation

## 2012-10-06 DIAGNOSIS — Z880 Allergy status to penicillin: Secondary | ICD-10-CM | POA: Insufficient documentation

## 2012-10-06 DIAGNOSIS — I10 Essential (primary) hypertension: Secondary | ICD-10-CM | POA: Insufficient documentation

## 2012-10-06 DIAGNOSIS — F411 Generalized anxiety disorder: Secondary | ICD-10-CM | POA: Insufficient documentation

## 2012-10-06 DIAGNOSIS — I4891 Unspecified atrial fibrillation: Secondary | ICD-10-CM | POA: Insufficient documentation

## 2012-10-06 DIAGNOSIS — Z8659 Personal history of other mental and behavioral disorders: Secondary | ICD-10-CM | POA: Insufficient documentation

## 2012-10-06 DIAGNOSIS — Z79899 Other long term (current) drug therapy: Secondary | ICD-10-CM | POA: Insufficient documentation

## 2012-10-06 DIAGNOSIS — D649 Anemia, unspecified: Secondary | ICD-10-CM

## 2012-10-06 LAB — BASIC METABOLIC PANEL
CO2: 21 mEq/L (ref 19–32)
Calcium: 8.8 mg/dL (ref 8.4–10.5)
Creatinine, Ser: 1.51 mg/dL — ABNORMAL HIGH (ref 0.50–1.10)
GFR calc non Af Amer: 31 mL/min — ABNORMAL LOW (ref >90)
Glucose, Bld: 116 mg/dL — ABNORMAL HIGH (ref 70–99)

## 2012-10-06 LAB — CBC WITH DIFFERENTIAL/PLATELET
Basophils Absolute: 0 10*3/uL (ref 0.0–0.1)
Eosinophils Absolute: 0 10*3/uL (ref 0.0–0.7)
Eosinophils Relative: 0 % (ref 0–5)
HCT: 26.4 % — ABNORMAL LOW (ref 36.0–46.0)
Lymphocytes Relative: 15 % (ref 12–46)
Lymphs Abs: 1.8 10*3/uL (ref 0.7–4.0)
MCH: 33 pg (ref 26.0–34.0)
MCV: 94.6 fL (ref 78.0–100.0)
Monocytes Absolute: 0.5 10*3/uL (ref 0.1–1.0)
RDW: 14.6 % (ref 11.5–15.5)
WBC: 12 10*3/uL — ABNORMAL HIGH (ref 4.0–10.5)

## 2012-10-06 NOTE — ED Notes (Signed)
Patient presents to ER via [facility] with c/o anxiety and shortness of breath.

## 2012-10-06 NOTE — ED Provider Notes (Signed)
History     This chart was scribed for Nat Christen, MD, MD by Rhae Lerner, ED Scribe. The patient was seen in room APA04/APA04 and the patient's care was started at 10:07 PM.   CSN: [id number]  Arrival date & time 10/06/12  2058   Chief Complaint  Patient presents with  . Anxiety  . Shortness of Breath     The history is provided by the patient and medical records. No language interpreter was used.   HPI Comments: Kimberly [name] is a 79 y.o. female with hx of HTN, depression, pacemaker (placed by Dr Sanda Klein) and atrial fibrillation who presents to the Emergency Department [facility] complaining of anxiety and SOB onset today. She states she awoke with SOB today. Pt mentions that symptoms seemed consistent with past panic attacks. Pt reports that SOB has improved since onset. She reports that she lives alone. She states she had episode of chest pain but it has subsided. She mentions that she Pt denies fever, chills, nausea, vomiting, diarrhea, cough and any other pain.   PCP is Dr. Jacelyn Grip  Past Medical History  Diagnosis Date  . Depression   . Atrial fibrillation   . Scoliosis   . Osteoarthritis   . Pacemaker   . Fibromyalgia   . Hypertension     Past Surgical History  Procedure Laterality Date  . Pacemaker insertion    . Hernia repair    . Abdominal hysterectomy    . Cholecystectomy    . Appendectomy    . Breast surgery    . Back surgery    . Neck surgery      Family History  Problem Relation Age of Onset  . Cancer Sister   . Asthma Sister   . Heart failure Brother     History  Substance Use Topics  . Smoking status: Former Smoker    Quit date: 12/13/2004  . Smokeless tobacco: Not on file  . Alcohol Use: 0.6 oz/week    1 Glasses of wine per week     Comment: frequently    OB History   Grav Para Term Preterm Abortions TAB SAB Ect Mult Living   5 2 1 1 3  3   2       Review of Systems 10 Systems reviewed and all are negative for acute change except as  noted in the HPI.    Allergies  Iodine; Papaya derivatives; Penicillins; and Sulfa antibiotics  Home Medications   Current Outpatient Rx  Name  Route  Sig  Dispense  Refill  . apixaban (ELIQUIS) 2.5 MG TABS tablet   Oral   Take 1 tablet (2.5 mg total) by mouth 2 (two) times daily.   180 tablet   0   . carvedilol (COREG) 6.25 MG tablet   Oral   Take 6.25 mg by mouth 2 (two) times daily with a meal.           . furosemide (LASIX) 40 MG tablet   Oral   Take 40 mg by mouth every morning.            BP 93/56  Pulse 70  Resp 14  SpO2 100%  Physical Exam  Nursing note and vitals reviewed. Constitutional: She is oriented to person, place, and time. She appears well-developed and well-nourished.  HENT:  Head: Normocephalic and atraumatic.  Eyes: Conjunctivae and EOM are normal. Pupils are equal, round, and reactive to light.  Neck: Normal range of motion. Neck supple.  Cardiovascular: Normal rate, regular rhythm and normal heart sounds.   Pulmonary/Chest: Effort normal and breath sounds normal.  Abdominal: Soft. Bowel sounds are normal.  Musculoskeletal: Normal range of motion.  Neurological: She is alert and oriented to person, place, and time.  Skin: Skin is warm and dry.  Psychiatric: She has a normal mood and affect.    ED Course  Procedures (including critical care time) DIAGNOSTIC STUDIES: Oxygen Saturation is 100% on room air, normal by my interpretation.    COORDINATION OF CARE: 10:11 PM Discussed ED treatment with pt and pt agrees to EKG and labs.   Results for orders placed during the hospital encounter of 10/06/12  CBC WITH DIFFERENTIAL      Result Value Range   WBC 12.0 (*) 4.0 - 10.5 K/uL   RBC 2.79 (*) 3.87 - 5.11 MIL/uL   Hemoglobin 9.2 (*) 12.0 - 15.0 g/dL   HCT 26.4 (*) 36.0 - 46.0 %   MCV 94.6  78.0 - 100.0 fL   MCH 33.0  26.0 - 34.0 pg   MCHC 34.8  30.0 - 36.0 g/dL   RDW 14.6  11.5 - 15.5 %   Platelets 191  150 - 400 K/uL   Neutrophils  Relative % 81 (*) 43 - 77 %   Neutro Abs 9.7 (*) 1.7 - 7.7 K/uL   Lymphocytes Relative 15  12 - 46 %   Lymphs Abs 1.8  0.7 - 4.0 K/uL   Monocytes Relative 4  3 - 12 %   Monocytes Absolute 0.5  0.1 - 1.0 K/uL   Eosinophils Relative 0  0 - 5 %   Eosinophils Absolute 0.0  0.0 - 0.7 K/uL   Basophils Relative 0  0 - 1 %   Basophils Absolute 0.0  0.0 - 0.1 K/uL  BASIC METABOLIC PANEL      Result Value Range   Sodium 127 (*) 135 - 145 mEq/L   Potassium 3.6  3.5 - 5.1 mEq/L   Chloride 91 (*) 96 - 112 mEq/L   CO2 21  19 - 32 mEq/L   Glucose, Bld 116 (*) 70 - 99 mg/dL   BUN 94 (*) 6 - 23 mg/dL   Creatinine, Ser 1.51 (*) 0.50 - 1.10 mg/dL   Calcium 8.8  8.4 - 10.5 mg/dL   GFR calc non Af Amer 31 (*) >90 mL/min   GFR calc Af Amer 36 (*) >90 mL/min  TROPONIN I      Result Value Range   Troponin I <0.30  <0.30 ng/mL    Dg Chest Port 1 View  10/06/2012   *RADIOLOGY REPORT*  Clinical Data: Shortness of breath, history of hypertension and atrial fibrillation  PORTABLE CHEST - 1 VIEW  Comparison: 02/12/2012  Findings: Left-sided AICD in place.  Cervical fusion hardware partly visualized.  Biapical pleural thickening noted.  Lungs are well aerated without focal pulmonary opacity.  Heart size normal. Aorta is ectatic and unfolded.  No pleural effusion.  Left costophrenic angle is omitted from the field of view.  IMPRESSION: No acute cardiopulmonary process.   Original Report Authenticated By: Conchita Paris, M.D.     No diagnosis found.   Date: 10/06/2012  Rate: 73  Rhythm: paced rhythm  QRS Axis: normal  Intervals: normal  ST/T Wave abnormalities: normal  Conduction Disutrbances:none  Narrative Interpretation:   Old EKG Reviewed: unchanged   MDM  Patient is in no acute distress.    Discussed anemia with the patient.   No black  stool. She will follow up with her primary care Dr.      I personally performed the services described in this documentation, which was scribed in my presence.  The recorded information has been reviewed and is accurate.    Nat Christen, MD 10/06/12 [phone]

## 2012-10-06 NOTE — ED Notes (Signed)
Placed bedside commode by bed.

## 2012-10-07 NOTE — ED Notes (Signed)
Pt states she has no way of getting home. Provided phone for patient to call her neighbor.

## 2012-10-07 NOTE — ED Notes (Signed)
Charge nurse attempted to call pt's neighbor, unsuccessful.

## 2012-10-07 NOTE — ED Notes (Signed)
Discharge instructions reviewed with pt, questions answered. Pt verbalized understanding. Pt sleeping in room until morning, unable to find ride, lives in [place].

## 2012-10-11 ENCOUNTER — Other Ambulatory Visit (INDEPENDENT_AMBULATORY_CARE_PROVIDER_SITE_OTHER): Payer: Medicare Other

## 2012-10-11 ENCOUNTER — Emergency Department (HOSPITAL_COMMUNITY): Payer: Medicare Other

## 2012-10-11 ENCOUNTER — Inpatient Hospital Stay (HOSPITAL_COMMUNITY)
Admission: EM | Admit: 2012-10-11 | Discharge: 2012-10-15 | DRG: 378 | Disposition: A | Discharge status: Home / Self Care | Payer: Medicare Other | Attending: Internal Medicine | Admitting: Internal Medicine

## 2012-10-11 ENCOUNTER — Encounter (HOSPITAL_COMMUNITY): Payer: Self-pay

## 2012-10-11 ENCOUNTER — Other Ambulatory Visit: Payer: Self-pay

## 2012-10-11 DIAGNOSIS — Z7901 Long term (current) use of anticoagulants: Secondary | ICD-10-CM

## 2012-10-11 DIAGNOSIS — Z950 Presence of cardiac pacemaker: Secondary | ICD-10-CM

## 2012-10-11 DIAGNOSIS — I5032 Chronic diastolic (congestive) heart failure: Secondary | ICD-10-CM | POA: Diagnosis present

## 2012-10-11 DIAGNOSIS — F329 Major depressive disorder, single episode, unspecified: Secondary | ICD-10-CM

## 2012-10-11 DIAGNOSIS — M797 Fibromyalgia: Secondary | ICD-10-CM

## 2012-10-11 DIAGNOSIS — E876 Hypokalemia: Secondary | ICD-10-CM | POA: Diagnosis present

## 2012-10-11 DIAGNOSIS — R5381 Other malaise: Secondary | ICD-10-CM | POA: Diagnosis present

## 2012-10-11 DIAGNOSIS — R42 Dizziness and giddiness: Secondary | ICD-10-CM | POA: Diagnosis present

## 2012-10-11 DIAGNOSIS — Z882 Allergy status to sulfonamides status: Secondary | ICD-10-CM

## 2012-10-11 DIAGNOSIS — M4120 Other idiopathic scoliosis, site unspecified: Secondary | ICD-10-CM | POA: Diagnosis present

## 2012-10-11 DIAGNOSIS — R079 Chest pain, unspecified: Secondary | ICD-10-CM

## 2012-10-11 DIAGNOSIS — K259 Gastric ulcer, unspecified as acute or chronic, without hemorrhage or perforation: Secondary | ICD-10-CM

## 2012-10-11 DIAGNOSIS — F32A Depression, unspecified: Secondary | ICD-10-CM

## 2012-10-11 DIAGNOSIS — I4891 Unspecified atrial fibrillation: Secondary | ICD-10-CM | POA: Diagnosis present

## 2012-10-11 DIAGNOSIS — K922 Gastrointestinal hemorrhage, unspecified: Secondary | ICD-10-CM

## 2012-10-11 DIAGNOSIS — Z9089 Acquired absence of other organs: Secondary | ICD-10-CM

## 2012-10-11 DIAGNOSIS — R5383 Other fatigue: Secondary | ICD-10-CM | POA: Diagnosis present

## 2012-10-11 DIAGNOSIS — Z880 Allergy status to penicillin: Secondary | ICD-10-CM

## 2012-10-11 DIAGNOSIS — I10 Essential (primary) hypertension: Secondary | ICD-10-CM

## 2012-10-11 DIAGNOSIS — K208 Other esophagitis without bleeding: Secondary | ICD-10-CM | POA: Diagnosis present

## 2012-10-11 DIAGNOSIS — N183 Chronic kidney disease, stage 3 unspecified: Secondary | ICD-10-CM | POA: Diagnosis present

## 2012-10-11 DIAGNOSIS — K2210 Ulcer of esophagus without bleeding: Secondary | ICD-10-CM

## 2012-10-11 DIAGNOSIS — D62 Acute posthemorrhagic anemia: Secondary | ICD-10-CM

## 2012-10-11 DIAGNOSIS — Z79899 Other long term (current) drug therapy: Secondary | ICD-10-CM

## 2012-10-11 DIAGNOSIS — M1990 Unspecified osteoarthritis, unspecified site: Secondary | ICD-10-CM | POA: Diagnosis present

## 2012-10-11 DIAGNOSIS — K2961 Other gastritis with bleeding: Secondary | ICD-10-CM | POA: Diagnosis present

## 2012-10-11 DIAGNOSIS — Z91041 Radiographic dye allergy status: Secondary | ICD-10-CM

## 2012-10-11 DIAGNOSIS — D649 Anemia, unspecified: Secondary | ICD-10-CM

## 2012-10-11 DIAGNOSIS — K449 Diaphragmatic hernia without obstruction or gangrene: Secondary | ICD-10-CM | POA: Diagnosis present

## 2012-10-11 DIAGNOSIS — I129 Hypertensive chronic kidney disease with stage 1 through stage 4 chronic kidney disease, or unspecified chronic kidney disease: Secondary | ICD-10-CM | POA: Diagnosis present

## 2012-10-11 DIAGNOSIS — I4821 Permanent atrial fibrillation: Secondary | ICD-10-CM | POA: Diagnosis present

## 2012-10-11 DIAGNOSIS — K254 Chronic or unspecified gastric ulcer with hemorrhage: Principal | ICD-10-CM | POA: Diagnosis present

## 2012-10-11 DIAGNOSIS — N289 Disorder of kidney and ureter, unspecified: Secondary | ICD-10-CM

## 2012-10-11 DIAGNOSIS — Z87891 Personal history of nicotine dependence: Secondary | ICD-10-CM

## 2012-10-11 DIAGNOSIS — Z66 Do not resuscitate: Secondary | ICD-10-CM | POA: Diagnosis present

## 2012-10-11 DIAGNOSIS — IMO0001 Reserved for inherently not codable concepts without codable children: Secondary | ICD-10-CM | POA: Diagnosis present

## 2012-10-11 DIAGNOSIS — R197 Diarrhea, unspecified: Secondary | ICD-10-CM

## 2012-10-11 DIAGNOSIS — F3289 Other specified depressive episodes: Secondary | ICD-10-CM | POA: Diagnosis present

## 2012-10-11 HISTORY — DX: Ulcer of esophagus without bleeding: K22.10

## 2012-10-11 HISTORY — DX: Acute posthemorrhagic anemia: D62

## 2012-10-11 HISTORY — DX: Gastric ulcer, unspecified as acute or chronic, without hemorrhage or perforation: K25.9

## 2012-10-11 LAB — CBC WITH DIFFERENTIAL/PLATELET
Basophils Absolute: 0 10*3/uL (ref 0.0–0.1)
Basophils Absolute: 0 10*3/uL (ref 0.0–0.1)
Basophils Relative: 0 % (ref 0–1)
Basophils Relative: 0 % (ref 0–1)
Eosinophils Absolute: 0.1 10*3/uL (ref 0.0–0.7)
Eosinophils Absolute: 0.1 10*3/uL (ref 0.0–0.7)
Eosinophils Relative: 1 % (ref 0–5)
HCT: 20.5 % — ABNORMAL LOW (ref 36.0–46.0)
Hemoglobin: 7.3 g/dL — CL (ref 12.0–15.0)
Hemoglobin: 6.7 g/dL — CL (ref 12.0–15.0)
Lymphocytes Relative: 14 % (ref 12–46)
Lymphocytes Relative: 22 % (ref 12–46)
Lymphs Abs: 1.2 10*3/uL (ref 0.7–4.0)
MCH: 33.6 pg (ref 26.0–34.0)
MCHC: 35.6 g/dL (ref 30.0–36.0)
MCHC: 35.3 g/dL (ref 30.0–36.0)
MCV: 94.5 fL (ref 78.0–100.0)
Monocytes Absolute: 0.7 10*3/uL (ref 0.1–1.0)
Monocytes Relative: 8 % (ref 3–12)
Neutro Abs: 6.5 10*3/uL (ref 1.7–7.7)
Neutrophils Relative %: 77 % (ref 43–77)
Neutrophils Relative %: 68 % (ref 43–77)
Platelets: 288 10*3/uL (ref 150–400)
Platelets: 260 10*3/uL (ref 150–400)
RBC: 2.17 MIL/uL — ABNORMAL LOW (ref 3.87–5.11)
RDW: 16.7 % — ABNORMAL HIGH (ref 11.5–15.5)
RDW: 15.4 % (ref 11.5–15.5)
WBC: 8.4 10*3/uL (ref 4.0–10.5)

## 2012-10-11 LAB — URINALYSIS, ROUTINE W REFLEX MICROSCOPIC
Bilirubin Urine: NEGATIVE
Glucose, UA: NEGATIVE mg/dL
Hgb urine dipstick: NEGATIVE
Specific Gravity, Urine: <1.005 — ABNORMAL LOW (ref 1.005–1.030)
Urobilinogen, UA: 0.2 mg/dL (ref 0.0–1.0)

## 2012-10-11 LAB — APTT: aPTT: 36 seconds (ref 24–37)

## 2012-10-11 LAB — PROTIME-INR
INR: 1.22 (ref 0.00–1.49)
Prothrombin Time: 15.2 seconds (ref 11.6–15.2)

## 2012-10-11 LAB — BASIC METABOLIC PANEL
Chloride: 95 mEq/L — ABNORMAL LOW (ref 96–112)
GFR calc Af Amer: 36 mL/min — ABNORMAL LOW (ref >90)
GFR calc non Af Amer: 31 mL/min — ABNORMAL LOW (ref >90)
Potassium: 3.2 mEq/L — ABNORMAL LOW (ref 3.5–5.1)
Sodium: 133 mEq/L — ABNORMAL LOW (ref 135–145)

## 2012-10-11 LAB — SAMPLE TO BLOOD BANK

## 2012-10-11 LAB — PREPARE RBC (CROSSMATCH)

## 2012-10-11 MED ORDER — SODIUM CHLORIDE 0.9 % IV SOLN
INTRAVENOUS | Status: AC
  Administered 2012-10-11: via INTRAVENOUS

## 2012-10-11 NOTE — H&P (Signed)
PCP:   Anthoney Harada, MD   Chief Complaint:  Dizziness  HPI: 79 year old female who came to the ED with generalized weakness and dizziness going on for past one week. It is associated with shortness of breath on exertion and lightheadedness, she denies chest pain. Denies nausea vomiting or diarrhea. Patient did not notice any blood in the stools. In the ED patient was found to be severely anemic with hemoglobin of 6.7, patient takes Eliquis for atrial fibrillation. And also takes Advil p.m. almost every night for sleep. She denies history of gastric ulcers. Patient had colonoscopy 2 years ago as per patient, and it was normal.  Allergies:   Allergies  Allergen Reactions  . Iodine Other (See Comments)    REACTION:If injected,  Rash/irritated skin reaction "welts"  . Penicillins Hives  . Sulfa Antibiotics Rash      Past Medical History  Diagnosis Date  . Depression   . Atrial fibrillation   . Scoliosis   . Osteoarthritis   . Pacemaker   . Fibromyalgia   . Hypertension     Past Surgical History  Procedure Laterality Date  . Pacemaker insertion    . Hernia repair    . Abdominal hysterectomy    . Cholecystectomy    . Appendectomy    . Breast surgery    . Back surgery    . Neck surgery      Prior to Admission medications   Medication Sig Start Date End Date Taking? Authorizing Provider  apixaban (ELIQUIS) 2.5 MG TABS tablet Take 1 tablet (2.5 mg total) by mouth 2 (two) times daily. 09/01/12  Yes Tammy Eckard, PHARMD  carvedilol (COREG) 6.25 MG tablet Take 6.25 mg by mouth 2 (two) times daily with a meal.     Yes Historical Provider, MD  ferrous sulfate 325 (65 FE) MG tablet Take 325 mg by mouth every morning.   Yes Historical Provider, MD  furosemide (LASIX) 40 MG tablet Take 40 mg by mouth every morning.    Yes Historical Provider, MD  HYDROcodone-acetaminophen (NORCO/VICODIN) 5-325 MG per tablet Take 1 tablet by mouth 3 (three) times daily as needed for pain.   Yes  Historical Provider, MD  ibuprofen (ADVIL,MOTRIN) 200 MG tablet Take 200 mg by mouth every 6 (six) hours as needed for pain.   Yes Historical Provider, MD    Social History:  reports that she quit smoking about 7 years ago. She does not have any smokeless tobacco history on file. She reports that she drinks about 0.6 ounces of alcohol per week. She reports that she does not use illicit drugs.  Family History  Problem Relation Age of Onset  . Cancer Sister   . Asthma Sister   . Heart failure Brother     Review of Systems:  HEENT: Denies headache, blurred vision, runny nose, sore throat,  Neck: Denies thyroid problems,lymphadenopathy Chest : Denies shortness of breath, no history of COPD Heart : Denies Chest pain,  coronary arterey disease, positive history of A. fib GI: Denies  nausea, vomiting, diarrhea, constipation GU: Denies dysuria, urgency, frequency of urination, hematuria Neuro: Denies stroke, seizures, syncope Psych: Denies depression, anxiety, hallucinations   Physical Exam: Blood pressure 111/40, pulse 74, temperature 98.1 F (36.7 C), temperature source Oral, resp. rate 18, height 5\' 7"  (1.702 m), weight 58.968 kg (130 lb), SpO2 91.00%. Constitutional:   Patient is a well-developed and well-nourished female in no acute distress and cooperative with exam. Head: Normocephalic and atraumatic Mouth: Mucus membranes moist  Eyes: PERRL, EOMI, conjunctivae normal Neck: Supple, No Thyromegaly Cardiovascular: RRR, S1 normal, S2 normal Pulmonary/Chest: CTAB, no wheezes, rales, or rhonchi Abdominal: Soft. Mild generalized tenderness, mild distention bowel sounds are normal, no masses, organomegaly, or guarding present.  Neurological: A&O x3, Strenght is normal and symmetric bilaterally, cranial nerve II-XII are grossly intact, no focal motor deficit, sensory intact to light touch bilaterally.  Extremities : No Cyanosis, Clubbing or Edema   Labs on Admission:  Results for  orders placed during the hospital encounter of 10/11/12 (from the past 48 hour(s))  CBC WITH DIFFERENTIAL     Status: Abnormal   Collection Time    10/11/12  9:10 PM      Result Value Range   WBC 8.2  4.0 - 10.5 K/uL   RBC 2.01 (*) 3.87 - 5.11 MIL/uL   Hemoglobin 6.7 (*) 12.0 - 15.0 g/dL   Comment: CRITICAL RESULT CALLED TO, READ BACK BY AND VERIFIED WITH:     MOORE,S ON 10/11/12 AT 2145 BY LOY,C   HCT 19.0 (*) 36.0 - 46.0 %   MCV 94.5  78.0 - 100.0 fL   MCH 33.3  26.0 - 34.0 pg   MCHC 35.3  30.0 - 36.0 g/dL   RDW 15.4  11.5 - 15.5 %   Platelets 260  150 - 400 K/uL   Neutrophils Relative % 68  43 - 77 %   Lymphocytes Relative 22  12 - 46 %   Monocytes Relative 9  3 - 12 %   Eosinophils Relative 1  0 - 5 %   Basophils Relative 0  0 - 1 %   Neutro Abs 5.6  1.7 - 7.7 K/uL   Lymphs Abs 1.8  0.7 - 4.0 K/uL   Monocytes Absolute 0.7  0.1 - 1.0 K/uL   Eosinophils Absolute 0.1  0.0 - 0.7 K/uL   Basophils Absolute 0.0  0.0 - 0.1 K/uL  BASIC METABOLIC PANEL     Status: Abnormal   Collection Time    10/11/12  9:10 PM      Result Value Range   Sodium 133 (*) 135 - 145 mEq/L   Potassium 3.2 (*) 3.5 - 5.1 mEq/L   Chloride 95 (*) 96 - 112 mEq/L   CO2 25  19 - 32 mEq/L   Glucose, Bld 100 (*) 70 - 99 mg/dL   BUN 28 (*) 6 - 23 mg/dL   Creatinine, Ser 1.51 (*) 0.50 - 1.10 mg/dL   Calcium 8.6  8.4 - 10.5 mg/dL   GFR calc non Af Amer 31 (*) >90 mL/min   GFR calc Af Amer 36 (*) >90 mL/min   Comment:            The eGFR has been calculated     using the CKD EPI equation.     This calculation has not been     validated in all clinical     situations.     eGFR's persistently     <90 mL/min signify     possible Chronic Kidney Disease.  APTT     Status: None   Collection Time    10/11/12  9:10 PM      Result Value Range   aPTT 36  24 - 37 seconds  PROTIME-INR     Status: None   Collection Time    10/11/12  9:10 PM      Result Value Range   Prothrombin Time 15.2  11.6 - 15.2 seconds  INR  1.22  0.00 - 1.49  SAMPLE TO BLOOD BANK     Status: None   Collection Time    10/11/12  9:11 PM      Result Value Range   Blood Bank Specimen SAMPLE AVAILABLE FOR TESTING     Sample Expiration 10/14/2012    URINALYSIS, ROUTINE W REFLEX MICROSCOPIC     Status: Abnormal   Collection Time    10/11/12  9:25 PM      Result Value Range   Color, Urine YELLOW  YELLOW   APPearance CLEAR  CLEAR   Specific Gravity, Urine <1.005 (*) 1.005 - 1.030   pH 6.0  5.0 - 8.0   Glucose, UA NEGATIVE  NEGATIVE mg/dL   Hgb urine dipstick NEGATIVE  NEGATIVE   Bilirubin Urine NEGATIVE  NEGATIVE   Ketones, ur NEGATIVE  NEGATIVE mg/dL   Protein, ur NEGATIVE  NEGATIVE mg/dL   Urobilinogen, UA 0.2  0.0 - 1.0 mg/dL   Nitrite NEGATIVE  NEGATIVE   Leukocytes, UA SMALL (*) NEGATIVE  URINE MICROSCOPIC-ADD ON     Status: None   Collection Time    10/11/12  9:25 PM      Result Value Range   Squamous Epithelial / LPF RARE  RARE   WBC, UA 0-2  <3 WBC/hpf   RBC / HPF 0-2  <3 RBC/hpf  PREPARE RBC (CROSSMATCH)     Status: None   Collection Time    10/11/12 10:42 PM      Result Value Range   Order Confirmation ORDER PROCESSED BY BLOOD BANK    TYPE AND SCREEN     Status: None   Collection Time    10/11/12 10:45 PM      Result Value Range   ABO/RH(D) PENDING     Antibody Screen PENDING     Sample Expiration 10/14/2012      Radiological Exams on Admission: Dg Chest 2 View  10/11/2012   *RADIOLOGY REPORT*  Clinical Data: Shortness of breath, weakness  CHEST - 2 VIEW  Comparison: 10/06/2012  Findings: Left subclavian AICD stable.  Cervical fixation hardware partially seen.  Heart size upper limits normal.  Lungs are hyperinflated, clear.  No effusion.  Mild vertebral compression fracture deformity in the lower thoracic spine stable. Atheromatous aortic arch.  IMPRESSION:  1.  Hyperinflation with stable chronic and postop changes.   Original Report Authenticated By: D. Wallace Going, MD    Assessment/Plan Active  Problems:   Atrial fibrillation   Pacemaker   Hypertension   Hypokalemia   Anemia   Dizziness  Anemia Patient was found to have heme-positive stools in the ED. Will be transfused one unit in the ED We'll transfer to another unit of PRBC on the floor  Melena Patient has heme-positive stools in the ED Will obtain occult blood x3 Most likely patient will require upper GI endoscopy to rule out gastric or duodenal  ulcer, as she has been using Advil for quite some time.  Will hold the Eliquis at this time  Hypokalemia Replace potassium and check BMP in the morning  Dizziness Likely due to anemia, will obtain orthostatics as her blood pressure is soft  Pacemaker Patient has paced rhythm on the EKG  Hypertension Will hold the antihypertensive medication at this time due to low BP Can restart in a.m. once the blood pressure is stable  DVT prophylaxis SCDs  Code status: DO NOT RESUSCITATE, discussed in detail with the patient. She does not want CPR, intubation,  mechanical ventilation.  Family discussion: Discussed with patient, the family at bedside   Time Spent on Admission: 88 min  [facility] Pager: [phone] 10/11/2012, 11:21 PM

## 2012-10-11 NOTE — ED Notes (Signed)
Seen here 10/06/12  And dx with nonspecific anemia, now increased sob, numbness and tingling all extremities

## 2012-10-11 NOTE — ED Provider Notes (Signed)
History     CSN: [id number]  Arrival date & time 10/11/12  2026   First MD Initiated Contact with Patient 10/11/12 2039      Chief Complaint  Patient presents with  . Shortness of Breath    HPI Pt was seen at 2050.  Per pt, c/o gradual onset and worsening of persistent generalized weakness/fatigue for the past 1 week. Has been associated with SOB and lightheadedness. States her symptoms worsen when she walks around her apartment. States she has had "black stools" for the past 2 weeks. Denies N/V/D, no blood in stools, no CP/palpitations, no cough, no back pain, no abd pain, no fevers.    Past Medical History  Diagnosis Date  . Depression   . Atrial fibrillation   . Scoliosis   . Osteoarthritis   . Pacemaker   . Fibromyalgia   . Hypertension     Past Surgical History  Procedure Laterality Date  . Pacemaker insertion    . Hernia repair    . Abdominal hysterectomy    . Cholecystectomy    . Appendectomy    . Breast surgery    . Back surgery    . Neck surgery      Family History  Problem Relation Age of Onset  . Cancer Sister   . Asthma Sister   . Heart failure Brother     History  Substance Use Topics  . Smoking status: Former Smoker    Quit date: 12/13/2004  . Smokeless tobacco: Not on file  . Alcohol Use: 0.6 oz/week    1 Glasses of wine per week     Comment: frequently    OB History   Grav Para Term Preterm Abortions TAB SAB Ect Mult Living   5 2 1 1 3  3   2       Review of Systems ROS: Statement: All systems negative except as marked or noted in the HPI; Constitutional: Negative for fever and chills. +generalized fatigue/weakness. ; ; Eyes: Negative for eye pain, redness and discharge. ; ; ENMT: Negative for ear pain, hoarseness, nasal congestion, sinus pressure and sore throat. ; ; Cardiovascular: Negative for chest pain, palpitations, diaphoresis, and peripheral edema. ; ; Respiratory: +SOB. Negative for cough, wheezing and stridor. ; ;  Gastrointestinal: +"black stools." Negative for nausea, vomiting, diarrhea, abdominal pain, blood in stool, hematemesis, jaundice and rectal bleeding. . ; ; Genitourinary: Negative for dysuria, flank pain and hematuria. ; ; Musculoskeletal: Negative for back pain and neck pain. Negative for swelling and trauma.; ; Skin: Negative for pruritus, rash, abrasions, blisters, bruising and skin lesion.; ; Neuro: +lightheadedness, near syncope. Negative for headache and neck stiffness. Negative for altered level of consciousness , altered mental status, extremity weakness, paresthesias, involuntary movement, seizure and syncope.       Allergies  Iodine; Penicillins; and Sulfa antibiotics  Home Medications   Current Outpatient Rx  Name  Route  Sig  Dispense  Refill  . apixaban (ELIQUIS) 2.5 MG TABS tablet   Oral   Take 1 tablet (2.5 mg total) by mouth 2 (two) times daily.   180 tablet   0   . carvedilol (COREG) 6.25 MG tablet   Oral   Take 6.25 mg by mouth 2 (two) times daily with a meal.           . ferrous sulfate 325 (65 FE) MG tablet   Oral   Take 325 mg by mouth every morning.         [name]  furosemide (LASIX) 40 MG tablet   Oral   Take 40 mg by mouth every morning.          [name] HYDROcodone-acetaminophen (NORCO/VICODIN) 5-325 MG per tablet   Oral   Take 1 tablet by mouth 3 (three) times daily as needed for pain.         [name] ibuprofen (ADVIL,MOTRIN) 200 MG tablet   Oral   Take 200 mg by mouth every 6 (six) hours as needed for pain.           BP 100/37  Pulse 86  Temp(Src) 98.7 F (37.1 C) (Oral)  Resp 18  Ht 5\' 7"  (1.702 m)  Wt 130 lb (58.968 kg)  BMI 20.36 kg/m2  SpO2 100%  Physical Exam 2055: Physical examination:  Nursing notes reviewed; Vital signs and O2 SAT reviewed;  Constitutional: Thin, frail, In no acute distress; Head:  Normocephalic, atraumatic; Eyes: EOMI, PERRL, No scleral icterus; ENMT: Mouth and pharynx normal, Mucous membranes dry; Neck: Supple, Full  range of motion, No lymphadenopathy; Cardiovascular: Regular rate and rhythm, No gallop; Respiratory: Breath sounds clear & equal bilaterally, No rales, rhonchi, wheezes.  Speaking full sentences with ease, Normal respiratory effort/excursion; Chest: Nontender, Movement normal; Abdomen: Soft, Nontender, Nondistended, Normal bowel sounds. Rectal exam performed w/permission of pt and ED RN chaperone present.  Anal tone normal.  Non-tender, soft black stool in rectal vault, heme positive.  No fissures, no external hemorrhoids, no palp masses.;;; Genitourinary: No CVA tenderness; Extremities: Pulses normal, No tenderness, No edema, No calf edema or asymmetry.; Neuro: AA&Ox3, Major CN grossly intact.  Speech clear. No gross focal motor or sensory deficits in extremities.; Skin: Color pale, Warm, Dry.   ED Course  Procedures      MDM  MDM Reviewed: previous chart, nursing note and vitals Reviewed previous: labs and ECG Interpretation: labs, ECG and x-ray    Date: 10/11/2012  Rate: 76  Rhythm: electronic pacemaker  QRS Axis: left  Intervals: normal  ST/T Wave abnormalities: normal  Conduction Disutrbances:none  Narrative Interpretation:   Old EKG Reviewed: unchanged; no significant changes from previous EKG dated 05/08/2012.  Results for orders placed during the hospital encounter of 10/11/12  URINALYSIS, ROUTINE W REFLEX MICROSCOPIC      Result Value Range   Color, Urine YELLOW  YELLOW   APPearance CLEAR  CLEAR   Specific Gravity, Urine <1.005 (*) 1.005 - 1.030   pH 6.0  5.0 - 8.0   Glucose, UA NEGATIVE  NEGATIVE mg/dL   Hgb urine dipstick NEGATIVE  NEGATIVE   Bilirubin Urine NEGATIVE  NEGATIVE   Ketones, ur NEGATIVE  NEGATIVE mg/dL   Protein, ur NEGATIVE  NEGATIVE mg/dL   Urobilinogen, UA 0.2  0.0 - 1.0 mg/dL   Nitrite NEGATIVE  NEGATIVE   Leukocytes, UA SMALL (*) NEGATIVE  CBC WITH DIFFERENTIAL      Result Value Range   WBC 8.2  4.0 - 10.5 K/uL   RBC 2.01 (*) 3.87 - 5.11 MIL/uL    Hemoglobin 6.7 (*) 12.0 - 15.0 g/dL   HCT 19.0 (*) 36.0 - 46.0 %   MCV 94.5  78.0 - 100.0 fL   MCH 33.3  26.0 - 34.0 pg   MCHC 35.3  30.0 - 36.0 g/dL   RDW 15.4  11.5 - 15.5 %   Platelets 260  150 - 400 K/uL   Neutrophils Relative % 68  43 - 77 %   Lymphocytes Relative 22  12 - 46 %   Monocytes  Relative 9  3 - 12 %   Eosinophils Relative 1  0 - 5 %   Basophils Relative 0  0 - 1 %   Neutro Abs 5.6  1.7 - 7.7 K/uL   Lymphs Abs 1.8  0.7 - 4.0 K/uL   Monocytes Absolute 0.7  0.1 - 1.0 K/uL   Eosinophils Absolute 0.1  0.0 - 0.7 K/uL   Basophils Absolute 0.0  0.0 - 0.1 K/uL  BASIC METABOLIC PANEL      Result Value Range   Sodium 133 (*) 135 - 145 mEq/L   Potassium 3.2 (*) 3.5 - 5.1 mEq/L   Chloride 95 (*) 96 - 112 mEq/L   CO2 25  19 - 32 mEq/L   Glucose, Bld 100 (*) 70 - 99 mg/dL   BUN 28 (*) 6 - 23 mg/dL   Creatinine, Ser 1.51 (*) 0.50 - 1.10 mg/dL   Calcium 8.6  8.4 - 10.5 mg/dL   GFR calc non Af Amer 31 (*) >90 mL/min   GFR calc Af Amer 36 (*) >90 mL/min  APTT      Result Value Range   aPTT 36  24 - 37 seconds  PROTIME-INR      Result Value Range   Prothrombin Time 15.2  11.6 - 15.2 seconds   INR 1.22  0.00 - 1.49  URINE MICROSCOPIC-ADD ON      Result Value Range   Squamous Epithelial / LPF RARE  RARE   WBC, UA 0-2  <3 WBC/hpf   RBC / HPF 0-2  <3 RBC/hpf  SAMPLE TO BLOOD BANK      Result Value Range   Blood Bank Specimen SAMPLE AVAILABLE FOR TESTING     Sample Expiration 10/14/2012    PREPARE RBC (CROSSMATCH)      Result Value Range   Order Confirmation ORDER PROCESSED BY BLOOD BANK     Dg Chest 2 View 10/11/2012   *RADIOLOGY REPORT*  Clinical Data: Shortness of breath, weakness  CHEST - 2 VIEW  Comparison: 10/06/2012  Findings: Left subclavian AICD stable.  Cervical fixation hardware partially seen.  Heart size upper limits normal.  Lungs are hyperinflated, clear.  No effusion.  Mild vertebral compression fracture deformity in the lower thoracic spine stable.  Atheromatous aortic arch.  IMPRESSION:  1.  Hyperinflation with stable chronic and postop changes.   Original Report Authenticated By: D. Wallace Going, MD   Results for LAPORSHE, CATOE (MRN [id number]) as of 10/11/2012 23:15  Ref. Range 12/15/2011 02:34 02/12/2012 05:39 08/16/2012 13:58 10/06/2012 22:15 10/11/2012 21:10  Hemoglobin Latest Range: 12.0-15.0 g/dL 10.3 (L) 12.6 12.0 9.2 (L) 6.7 (LL)  HCT Latest Range: 36.0-46.0 % 29.9 (L) 36.5 35.3 (L) 26.4 (L) 19.0 (L)    Results for MYLEA, PENDELTON (MRN [id number]) as of 10/11/2012 23:15  Ref. Range 12/15/2011 02:34 02/12/2012 05:39 08/16/2012 13:58 10/06/2012 22:15 10/11/2012 21:10  BUN Latest Range: 6-23 mg/dL 20 30 (H) 27 (H) 94 (H) 28 (H)  Creatinine Latest Range: 0.50-1.10 mg/dL 1.26 (H) 1.56 (H) 1.40 (H) 1.51 (H) 1.51 (H)      2245:  H/H lower than previous. POC stool is heme positive; but no over rectal bleeding while in the ED.  BUN/Cr per baseline. Pt is orthostatic; will T&S and start to transfuse PRBC's for symptomatic anemia.  Pt continues to deny CP.  Dx and testing d/w pt.  Questions answered.  Verb understanding, agreeable to admit. T/C to [facility] Dr. Darrick Meigs, case discussed, including:  HPI, pertinent  PM/SHx, VS/PE, dx testing, ED course and treatment:  Agreeable to admit, requests to write temporary orders, obtain stepdown bed to team 2.         Alfonzo Feller, DO 10/14/12 0122

## 2012-10-11 NOTE — Progress Notes (Signed)
[facility] came in with lab orders

## 2012-10-11 NOTE — ED Notes (Signed)
CRITICAL VALUE ALERT  Critical value received: Hbg 6.7  Date of notification: 10/11/12  Time of notification: 2146 Critical value read back: yes  Nurse who received alert:  Gerlene Burdock  MD notified (1st page):  2146  Time of first page: dr Thurnell Garbe  MD notified (2nd page):  Time of second page:  Responding MD:  Dr Thurnell Garbe  Time MD responded: 2146

## 2012-10-11 NOTE — ED Notes (Signed)
Patient transferred to bedside commode with no problems.

## 2012-10-12 ENCOUNTER — Encounter (HOSPITAL_COMMUNITY): Payer: Self-pay | Admitting: *Deleted

## 2012-10-12 ENCOUNTER — Telehealth: Payer: Self-pay | Admitting: *Deleted

## 2012-10-12 DIAGNOSIS — N183 Chronic kidney disease, stage 3 unspecified: Secondary | ICD-10-CM | POA: Diagnosis present

## 2012-10-12 DIAGNOSIS — D649 Anemia, unspecified: Secondary | ICD-10-CM

## 2012-10-12 DIAGNOSIS — D62 Acute posthemorrhagic anemia: Secondary | ICD-10-CM

## 2012-10-12 DIAGNOSIS — K922 Gastrointestinal hemorrhage, unspecified: Secondary | ICD-10-CM

## 2012-10-12 DIAGNOSIS — K921 Melena: Secondary | ICD-10-CM

## 2012-10-12 DIAGNOSIS — Z7901 Long term (current) use of anticoagulants: Secondary | ICD-10-CM

## 2012-10-12 HISTORY — DX: Chronic kidney disease, stage 3 unspecified: N18.30

## 2012-10-12 HISTORY — DX: Long term (current) use of anticoagulants: Z79.01

## 2012-10-12 LAB — CBC
HCT: 22.5 % — ABNORMAL LOW (ref 36.0–46.0)
Hemoglobin: 7.9 g/dL — ABNORMAL LOW (ref 12.0–15.0)
MCH: 31.7 pg (ref 26.0–34.0)
MCHC: 35.1 g/dL (ref 30.0–36.0)
RBC: 2.49 MIL/uL — ABNORMAL LOW (ref 3.87–5.11)

## 2012-10-12 LAB — MRSA PCR SCREENING: MRSA by PCR: NEGATIVE

## 2012-10-12 LAB — URINE CULTURE
Colony Count: NO GROWTH
Culture: NO GROWTH

## 2012-10-12 LAB — COMPREHENSIVE METABOLIC PANEL
ALT: 8 U/L (ref 0–35)
Alkaline Phosphatase: 52 U/L (ref 39–117)
BUN: 24 mg/dL — ABNORMAL HIGH (ref 6–23)
CO2: 24 mEq/L (ref 19–32)
GFR calc Af Amer: 37 mL/min — ABNORMAL LOW (ref >90)
GFR calc non Af Amer: 32 mL/min — ABNORMAL LOW (ref >90)
Glucose, Bld: 93 mg/dL (ref 70–99)
Potassium: 3.4 mEq/L — ABNORMAL LOW (ref 3.5–5.1)
Sodium: 138 mEq/L (ref 135–145)
Total Bilirubin: 0.7 mg/dL (ref 0.3–1.2)
Total Protein: 5.9 g/dL — ABNORMAL LOW (ref 6.0–8.3)

## 2012-10-12 LAB — BASIC METABOLIC PANEL WITH GFR
BUN: 28 mg/dL — ABNORMAL HIGH (ref 6–23)
Calcium: 8.4 mg/dL (ref 8.4–10.5)
Creat: 1.48 mg/dL — ABNORMAL HIGH (ref 0.50–1.10)
GFR, Est African American: 37 mL/min — ABNORMAL LOW
GFR, Est Non African American: 33 mL/min — ABNORMAL LOW

## 2012-10-12 MED ORDER — PANTOPRAZOLE SODIUM 40 MG IV SOLR
40.0000 mg | Freq: Two times a day (BID) | INTRAVENOUS | Status: DC
  Administered 2012-10-12 (×2): 40 mg via INTRAVENOUS
  Filled 2012-10-12 (×2): qty 40

## 2012-10-12 MED ORDER — CHLORHEXIDINE GLUCONATE CLOTH 2 % EX PADS
6.0000 | MEDICATED_PAD | Freq: Once | CUTANEOUS | Status: AC
  Administered 2012-10-12: 6 via TOPICAL

## 2012-10-12 MED ORDER — HYDROCODONE-ACETAMINOPHEN 5-325 MG PO TABS
1.0000 | ORAL_TABLET | Freq: Three times a day (TID) | ORAL | Status: DC | PRN
  Administered 2012-10-12 – 2012-10-14 (×9): 1 via ORAL
  Filled 2012-10-12 (×10): qty 1

## 2012-10-12 MED ORDER — CARVEDILOL 3.125 MG PO TABS
6.2500 mg | ORAL_TABLET | Freq: Two times a day (BID) | ORAL | Status: DC
  Administered 2012-10-12: 6.25 mg via ORAL
  Filled 2012-10-12: qty 2

## 2012-10-12 MED ORDER — POTASSIUM CHLORIDE CRYS ER 20 MEQ PO TBCR
40.0000 meq | EXTENDED_RELEASE_TABLET | Freq: Once | ORAL | Status: AC
  Administered 2012-10-12: 40 meq via ORAL
  Filled 2012-10-12: qty 2

## 2012-10-12 MED ORDER — ONDANSETRON HCL 4 MG PO TABS: 4.0000 mg | ORAL_TABLET | Freq: Four times a day (QID) | ORAL | Status: DC | PRN

## 2012-10-12 MED ORDER — PANTOPRAZOLE SODIUM 40 MG IV SOLR
40.0000 mg | Freq: Two times a day (BID) | INTRAVENOUS | Status: DC
  Administered 2012-10-13 – 2012-10-14 (×3): 40 mg via INTRAVENOUS
  Filled 2012-10-12 (×3): qty 40

## 2012-10-12 MED ORDER — BIOTENE DRY MOUTH MT LIQD
15.0000 mL | Freq: Two times a day (BID) | OROMUCOSAL | Status: DC
  Administered 2012-10-12 – 2012-10-14 (×7): 15 mL via OROMUCOSAL

## 2012-10-12 MED ORDER — ACETAMINOPHEN 650 MG RE SUPP: 650.0000 mg | Freq: Four times a day (QID) | RECTAL | Status: DC | PRN

## 2012-10-12 MED ORDER — POTASSIUM CHLORIDE CRYS ER 20 MEQ PO TBCR: 40.0000 meq | EXTENDED_RELEASE_TABLET | Freq: Once | ORAL | Status: DC

## 2012-10-12 MED ORDER — ACETAMINOPHEN 325 MG PO TABS
650.0000 mg | ORAL_TABLET | Freq: Four times a day (QID) | ORAL | Status: DC | PRN
  Administered 2012-10-13 – 2012-10-14 (×2): 650 mg via ORAL
  Filled 2012-10-12: qty 2

## 2012-10-12 MED ORDER — POTASSIUM CHLORIDE IN NACL 20-0.9 MEQ/L-% IV SOLN
INTRAVENOUS | Status: DC
  Administered 2012-10-12 – 2012-10-13 (×3): via INTRAVENOUS

## 2012-10-12 MED ORDER — ONDANSETRON HCL 4 MG/2ML IJ SOLN: 4.0000 mg | Freq: Four times a day (QID) | INTRAMUSCULAR | Status: DC | PRN

## 2012-10-12 NOTE — Progress Notes (Signed)
[facility] PROGRESS NOTE  Kimberly [name] [id number] DOB: 12-20-1926 DOA: 10/11/2012 PCP: Anthoney Harada, MD  Assessment/Plan: 1. Melena, likely upper GI bleeding. Patient presented with dark colored/heme positive stool. She is on anticoagulation with Eliquis and admits to taking Advil p.m. every night. We'll continue on Protonix. Gastroenterology has been consulted. 2. Acute blood loss anemia. She has been transfused 1 unit of PRBC. Another unit has been ordered. Her hemoglobin has improved since admission. We'll continue to monitor serial CBCs. 3. Atrial fibrillation. Status post pacemaker. Anticoagulation is currently on hold 4. Hypertension. Holding antihypertensives his blood pressure is low. 5. Hypokalemia. Replace. 6. CKD stage III. Renal function appears to be at baseline.  Code Status: DO NOT RESUSCITATE Family Communication: Discussed with patient (indicate person spoken with, relationship, and if by phone, the number) Disposition Plan: Pending hospital course, likely home when improved   Consultants:  Gastroenterology  Procedures:  None  Antibiotics:  None (indicate start date, and stop date if known)  HPI/Subjective: Still feels weak and tired, but overall does feel a little better since admission.  Objective: Filed Vitals:   10/12/12 0415 10/12/12 0419 10/12/12 0500 10/12/12 0600  BP:  118/46 105/37 107/55  Pulse: 70 70 86 70  Temp:  98.6 F (37 C)    TempSrc:  Oral    Resp: 14 13 18 16   Height:      Weight:   67.7 kg (149 lb 4 oz)   SpO2:   97% 91%    Intake/Output Summary (Last 24 hours) at 10/12/12 0938 Last data filed at 10/12/12 0600  Gross per 24 hour  Intake 463.75 ml  Output      0 ml  Net 463.75 ml   Filed Weights   10/11/12 2006 10/12/12 0022 10/12/12 0500  Weight: 58.968 kg (130 lb) 67 kg (147 lb 11.3 oz) 67.7 kg (149 lb 4 oz)    Exam:   General:  No acute distress  Cardiovascular: S1, S2, regular rate and  rhythm  Respiratory: Clear to auscultation bilaterally  Abdomen: Soft, nontender, nondistended, bowel sounds are active  Musculoskeletal: No pedal edema bilaterally   Data Reviewed: Basic Metabolic Panel:  Recent Labs Lab 10/06/12 2215 10/11/12 1134 10/11/12 2110 10/12/12 0454  NA 127* 134* 133* 138  K 3.6 3.7 3.2* 3.4*  CL 91* 96 95* 102  CO2 21 26 25 24   GLUCOSE 116* 78 100* 93  BUN 94* 28* 28* 24*  CREATININE 1.51* 1.48* 1.51* 1.47*  CALCIUM 8.8 8.4 8.6 8.5   Liver Function Tests:  Recent Labs Lab 10/12/12 0454  AST 17  ALT 8  ALKPHOS 52  BILITOT 0.7  PROT 5.9*  ALBUMIN 3.1*   No results found for this basename: LIPASE, AMYLASE,  in the last 168 hours No results found for this basename: AMMONIA,  in the last 168 hours CBC:  Recent Labs Lab 10/06/12 2215 10/11/12 1134 10/11/12 2110 10/12/12 0454  WBC 12.0* 8.4 8.2 7.7  NEUTROABS 9.7* 6.5 5.6  --   HGB 9.2* 7.3* 6.7* 7.9*  HCT 26.4* 20.5* 19.0* 22.5*  MCV 94.6 94.5 94.5 90.4  PLT 191 288 260 250   Cardiac Enzymes:  Recent Labs Lab 10/06/12 2215  TROPONINI <0.30   BNP (last 3 results)  Recent Labs  11/27/11 0726  PROBNP 1500.0*   CBG: No results found for this basename: GLUCAP,  in the last 168 hours  Recent Results (from the past 240 hour(s))  MRSA PCR SCREENING  Status: None   Collection Time    10/12/12 12:19 AM      Result Value Range Status   MRSA by PCR NEGATIVE  NEGATIVE Final   Comment:            The GeneXpert MRSA Assay (FDA     approved for NASAL specimens     only), is one component of a     comprehensive MRSA colonization     surveillance program. It is not     intended to diagnose MRSA     infection nor to guide or     monitor treatment for     MRSA infections.     Studies: Dg Chest 2 View  10/11/2012   *RADIOLOGY REPORT*  Clinical Data: Shortness of breath, weakness  CHEST - 2 VIEW  Comparison: 10/06/2012  Findings: Left subclavian AICD stable.  Cervical  fixation hardware partially seen.  Heart size upper limits normal.  Lungs are hyperinflated, clear.  No effusion.  Mild vertebral compression fracture deformity in the lower thoracic spine stable. Atheromatous aortic arch.  IMPRESSION:  1.  Hyperinflation with stable chronic and postop changes.   Original Report Authenticated By: D. Wallace Going, MD    Scheduled Meds: . antiseptic oral rinse  15 mL Mouth Rinse BID  . carvedilol  6.25 mg Oral BID WC  . pantoprazole (PROTONIX) IV  40 mg Intravenous Q12H   Continuous Infusions: . 0.9 % NaCl with KCl 20 mEq / L 75 mL/hr at 10/12/12 0600    Active Problems:   Atrial fibrillation   Pacemaker   Hypertension   Hypokalemia   Acute blood loss anemia   Dizziness   CKD (chronic kidney disease) stage 3, GFR 30-59 ml/min   Chronic anticoagulation    Time spent: 52mins    Temeca Somma  [facility] Pager [phone]. If 7PM-7AM, please contact night-coverage at [email or web address], password [facility] 10/12/2012, 9:38 AM  LOS: 1 day

## 2012-10-12 NOTE — Telephone Encounter (Signed)
Thank you :)

## 2012-10-12 NOTE — Consult Note (Signed)
Referring Provider: Dr. Roderic Palau Primary Care Physician:  Anthoney Harada, MD Primary Gastroenterologist:  Dr. Oneida Alar   Date of Admission: 10/11/12 Date of Consultation: 10/12/12  Reason for Consultation:  Acute blood loss anemia, melena  HPI:  Kimberly [name] is an 79 year old female admitted yesterday evening with weakness and dizziness, found to be significantly anemic with a Hgb of 6.7. She takes Eliquis for afib and admits to Advil pm each evening. Last dose of Eliquis was yesterday morning.  2 units PRBCs have been ordered, and she has received one thus far with a repeat Hgb improving to 7.9. She notes symptoms of fatigue worsening since last Thursday, feeling close to fainting. Notes black, tarry stool last week. Denies nausea, vomiting, epigastric pain. Admits to a poor appetite but denies GERD symptoms, dysphagia. She is unsure if she has lost any weight. Mild, vague abdominal discomfort reported as "gas pains" occasionally, but this is not associated with current presentation per patient. Her last colonoscopy was a few years ago at [facility], per patient. She had a remote EGD, possibly in [place]. She is unclear why this was performed. Denies any prior history of ulcers or GI bleeding. She does state her PCP started her on iron recently, but this was after she noted melena. Since then, she has noted constipation.   Past Medical History  Diagnosis Date  . Depression   . Atrial fibrillation   . Scoliosis   . Osteoarthritis   . Pacemaker   . Fibromyalgia   . Hypertension     Past Surgical History  Procedure Laterality Date  . Pacemaker insertion    . Hernia repair    . Abdominal hysterectomy    . Cholecystectomy    . Appendectomy    . Breast surgery    . Back surgery    . Neck surgery      Prior to Admission medications   Medication Sig Start Date End Date Taking? Authorizing Provider  apixaban (ELIQUIS) 2.5 MG TABS tablet Take 1 tablet (2.5 mg total) by mouth 2 (two) times  daily. 09/01/12  Yes Tammy Eckard, PHARMD  carvedilol (COREG) 6.25 MG tablet Take 6.25 mg by mouth 2 (two) times daily with a meal.     Yes Historical Provider, MD  ferrous sulfate 325 (65 FE) MG tablet Take 325 mg by mouth every morning.   Yes Historical Provider, MD  furosemide (LASIX) 40 MG tablet Take 40 mg by mouth every morning.    Yes Historical Provider, MD  HYDROcodone-acetaminophen (NORCO/VICODIN) 5-325 MG per tablet Take 1 tablet by mouth 3 (three) times daily as needed for pain.   Yes Historical Provider, MD  ibuprofen (ADVIL,MOTRIN) 200 MG tablet Take 200 mg by mouth every 6 (six) hours as needed for pain.   Yes Historical Provider, MD    Current Facility-Administered Medications  Medication Dose Route Frequency Provider Last Rate Last Dose  . 0.9 % NaCl with KCl 20 mEq/ L  infusion   Intravenous Continuous Oswald Hillock, MD 75 mL/hr at 10/12/12 0600    . acetaminophen (TYLENOL) tablet 650 mg  650 mg Oral Q6H PRN Oswald Hillock, MD       Or  . acetaminophen (TYLENOL) suppository 650 mg  650 mg Rectal Q6H PRN Oswald Hillock, MD      . antiseptic oral rinse (BIOTENE) solution 15 mL  15 mL Mouth Rinse BID Oswald Hillock, MD   15 mL at 10/12/12 0933  . HYDROcodone-acetaminophen (NORCO/VICODIN) 5-325 MG per tablet  1 tablet  1 tablet Oral TID PRN Oswald Hillock, MD   1 tablet at 10/12/12 0519  . ondansetron (ZOFRAN) tablet 4 mg  4 mg Oral Q6H PRN Oswald Hillock, MD       Or  . ondansetron (ZOFRAN) injection 4 mg  4 mg Intravenous Q6H PRN Oswald Hillock, MD      . pantoprazole (PROTONIX) injection 40 mg  40 mg Intravenous Q12H Oswald Hillock, MD   40 mg at 10/12/12 0033  . potassium chloride SA (K-DUR,KLOR-CON) CR tablet 40 mEq  40 mEq Oral Once Kathie Dike, MD        Allergies as of 10/11/2012 - Review Complete 10/11/2012  Allergen Reaction Noted  . Iodine Other (See Comments) 01/22/2011  . Penicillins Hives 01/22/2011  . Sulfa antibiotics Rash 01/22/2011    Family History  Problem Relation  Age of Onset  . Cancer Sister   . Asthma Sister   . Heart failure Brother   . Colon cancer Neg Hx     History   Social History  . Marital Status: Married    Spouse Name: N/A    Number of Children: N/A  . Years of Education: N/A   Occupational History  . telephone operator     retired   Social History Main Topics  . Smoking status: Former Smoker    Quit date: 12/13/2004  . Smokeless tobacco: Not on file  . Alcohol Use: 0.6 oz/week    1 Glasses of wine per week     Comment: several glasses of wine each evening  . Drug Use: No  . Sexually Active: No   Other Topics Concern  . Not on file   Social History Narrative  . No narrative on file    Review of Systems: Gen: see HPI CV: Denies chest pain, heart palpitations, syncope, edema  Resp: +DOE GI: see HPI GU : Denies urinary burning, urinary frequency, urinary incontinence.  MS: + joint pain Derm: Denies rash, itching, dry skin Psych: occasional mild depression, lives alone, family out of state Heme: see HPI  Physical Exam: Vital signs in last 24 hours: Temp:  [97.8 F (36.6 C)-98.8 F (37.1 C)] 98.6 F (37 C) (06/10 0419) Pulse Rate:  [57-86] 70 (06/10 0600) Resp:  [13-23] 16 (06/10 0600) BP: (95-130)/(37-56) 107/55 mmHg (06/10 0600) SpO2:  [84 %-100 %] 91 % (06/10 0600) Weight:  [130 lb (58.968 kg)-149 lb 4 oz (67.7 kg)] 149 lb 4 oz (67.7 kg) (06/10 0500) Last BM Date: 10/11/12 General:   Alert,  Well-developed, well-nourished, pleasant and cooperative in NAD. Appears younger than stated age.  Head:  Normocephalic and atraumatic. Eyes:  Sclera clear, no icterus.   Conjunctiva pink. Ears:  Normal auditory acuity. Nose:  No deformity, discharge,  or lesions. Mouth:  No deformity or lesions, dentition normal. Neck:  Supple; no masses or thyromegaly. Lungs:  Clear throughout to auscultation.   No wheezes, crackles, or rhonchi. No acute distress. Heart:  S1 S2 present Abdomen:  Soft, nontender and  nondistended. No masses, hepatosplenomegaly. Possible small umbilical hernia. Normal bowel sounds, without guarding, and without rebound.   Rectal:  Deferred  Msk:  Symmetrical without gross deformities. Normal posture. Extremities:  Without clubbing or edema. Neurologic:  Alert and  oriented x4;  grossly normal neurologically. Skin:  Intact without significant lesions or rashes. Cervical Nodes:  No significant cervical adenopathy. Psych:  Alert and cooperative. Normal mood and affect.  Intake/Output from previous day: 06/09  [id number] - 06/10 0700 In: 463.8 [I.V.:463.8] Out: -  Intake/Output this shift:    Lab Results:  Recent Labs  10/11/12 1134 10/11/12 2110 10/12/12 0454  WBC 8.4 8.2 7.7  HGB 7.3* 6.7* 7.9*  HCT 20.5* 19.0* 22.5*  PLT 288 260 250   BMET  Recent Labs  10/11/12 1134 10/11/12 2110 10/12/12 0454  NA 134* 133* 138  K 3.7 3.2* 3.4*  CL 96 95* 102  CO2 26 25 24   GLUCOSE 78 100* 93  BUN 28* 28* 24*  CREATININE 1.48* 1.51* 1.47*  CALCIUM 8.4 8.6 8.5   LFT  Recent Labs  10/12/12 0454  PROT 5.9*  ALBUMIN 3.1*  AST 17  ALT 8  ALKPHOS 52  BILITOT 0.7   PT/INR  Recent Labs  10/11/12 2110  LABPROT 15.2  INR 1.22    Studies/Results: Dg Chest 2 View  10/11/2012   *RADIOLOGY REPORT*  Clinical Data: Shortness of breath, weakness  CHEST - 2 VIEW  Comparison: 10/06/2012  Findings: Left subclavian AICD stable.  Cervical fixation hardware partially seen.  Heart size upper limits normal.  Lungs are hyperinflated, clear.  No effusion.  Mild vertebral compression fracture deformity in the lower thoracic spine stable. Atheromatous aortic arch.  IMPRESSION:  1.  Hyperinflation with stable chronic and postop changes.   Original Report Authenticated By: D. Wallace Going, MD    Impression: 79 year old female with acute blood loss anemia, heme positive stools, and melena in the setting of Eliquis for afib and daily Advil pm. Concern for upper GI source, with need  for upper endoscopy in near future. She has responded well to 1 unit of PRBCs, and an additional unit has been ordered. With the setting of Eliquis, will need to determine the best timing of this procedure. As she last took it on 6/9 in the morning, may need to wait until 6/11 or 6/12 for diagnostic purposes. Will discuss with pharmacy.   Plan: Clear liquids Serial Hgb/Hct Agree with transfusion as previously ordered PPI BID EGD in near future after discussion with pharmacy regarding timing secondary to [place], ANP-BC [facility]     LOS: 1 day    10/12/2012, 10:04 AM  Spoke with Fulton Reek in the pharmacy. Hold for 24-48 hours prior to procedure. Discuss with Dr. Oneida Alar; anticipate EGD tomorrow, 6/11, with Dr. Gala Romney.  Orvil Feil, ANP-BC [facility]  1230 pm

## 2012-10-12 NOTE — Telephone Encounter (Signed)
She is at [facility] now- carol will update you

## 2012-10-12 NOTE — Telephone Encounter (Signed)
Needs to go back because hgb we got was 7.8- was blood transfusion after that lab was done?

## 2012-10-12 NOTE — Progress Notes (Signed)
UR Chart Review Completed  

## 2012-10-12 NOTE — Consult Note (Signed)
REVIEWED. AGREE. 

## 2012-10-12 NOTE — Telephone Encounter (Signed)
Went to [facility] last - got a trasfusion and admitted  Kimberly [name] spinks will follow her Last week hbg 9.8 with dark tarry stools   fyi to mmm

## 2012-10-13 ENCOUNTER — Encounter (HOSPITAL_COMMUNITY)
Admission: EM | Disposition: A | Discharge status: Home / Self Care | Payer: Self-pay | Source: Home / Self Care | Attending: Internal Medicine

## 2012-10-13 ENCOUNTER — Encounter (HOSPITAL_COMMUNITY): Payer: Self-pay | Admitting: *Deleted

## 2012-10-13 DIAGNOSIS — K259 Gastric ulcer, unspecified as acute or chronic, without hemorrhage or perforation: Secondary | ICD-10-CM

## 2012-10-13 DIAGNOSIS — I10 Essential (primary) hypertension: Secondary | ICD-10-CM

## 2012-10-13 DIAGNOSIS — K921 Melena: Secondary | ICD-10-CM

## 2012-10-13 DIAGNOSIS — K210 Gastro-esophageal reflux disease with esophagitis: Secondary | ICD-10-CM

## 2012-10-13 HISTORY — PX: ESOPHAGOGASTRODUODENOSCOPY: SHX5428

## 2012-10-13 LAB — BASIC METABOLIC PANEL
BUN: 13 mg/dL (ref 6–23)
Chloride: 110 mEq/L (ref 96–112)
GFR calc Af Amer: 43 mL/min — ABNORMAL LOW (ref >90)
GFR calc non Af Amer: 37 mL/min — ABNORMAL LOW (ref >90)
Potassium: 4 mEq/L (ref 3.5–5.1)
Sodium: 140 mEq/L (ref 135–145)

## 2012-10-13 LAB — CBC
HCT: 21.2 % — ABNORMAL LOW (ref 36.0–46.0)
HCT: 22.6 % — ABNORMAL LOW (ref 36.0–46.0)
Hemoglobin: 7.7 g/dL — ABNORMAL LOW (ref 12.0–15.0)
MCH: 31.8 pg (ref 26.0–34.0)
MCHC: 34 g/dL (ref 30.0–36.0)
Platelets: 236 10*3/uL (ref 150–400)
RBC: 2.42 MIL/uL — ABNORMAL LOW (ref 3.87–5.11)
RDW: 19.5 % — ABNORMAL HIGH (ref 11.5–15.5)
WBC: 8 10*3/uL (ref 4.0–10.5)

## 2012-10-13 SURGERY — EGD (ESOPHAGOGASTRODUODENOSCOPY)
Anesthesia: Moderate Sedation

## 2012-10-13 MED ORDER — ONDANSETRON HCL 4 MG/2ML IJ SOLN
INTRAMUSCULAR | Status: AC
  Filled 2012-10-13: qty 2

## 2012-10-13 MED ORDER — MEPERIDINE HCL 100 MG/ML IJ SOLN
INTRAMUSCULAR | Status: DC | PRN
  Administered 2012-10-13 (×2): 25 mg via INTRAVENOUS

## 2012-10-13 MED ORDER — MIDAZOLAM HCL 5 MG/5ML IJ SOLN
INTRAMUSCULAR | Status: AC
  Filled 2012-10-13: qty 5

## 2012-10-13 MED ORDER — STERILE WATER FOR IRRIGATION IR SOLN
Status: DC | PRN
  Administered 2012-10-13: 15:00:00

## 2012-10-13 MED ORDER — ONDANSETRON HCL 4 MG/2ML IJ SOLN
INTRAMUSCULAR | Status: DC | PRN
  Administered 2012-10-13: 4 mg via INTRAVENOUS

## 2012-10-13 MED ORDER — MEPERIDINE HCL 100 MG/ML IJ SOLN
INTRAMUSCULAR | Status: AC
  Filled 2012-10-13: qty 1

## 2012-10-13 MED ORDER — SODIUM CHLORIDE 0.9 % IV SOLN
INTRAVENOUS | Status: DC
  Administered 2012-10-13: 1000 mL via INTRAVENOUS

## 2012-10-13 MED ORDER — BUTAMBEN-TETRACAINE-BENZOCAINE 2-2-14 % EX AERO
INHALATION_SPRAY | CUTANEOUS | Status: DC | PRN
  Administered 2012-10-13: 1 via TOPICAL

## 2012-10-13 MED ORDER — MIDAZOLAM HCL 5 MG/5ML IJ SOLN
INTRAMUSCULAR | Status: DC | PRN
  Administered 2012-10-13: 1 mg via INTRAVENOUS
  Administered 2012-10-13: 2 mg via INTRAVENOUS

## 2012-10-13 NOTE — Progress Notes (Signed)
[facility] PROGRESS NOTE  Kimberly [name] [id number] DOB: March 23, 1927 DOA: 10/11/2012 PCP: Anthoney Harada, MD  Assessment/Plan: 1. Melena, likely upper GI bleeding. Patient presented with dark colored/heme positive stool. She is on anticoagulation with Eliquis and admits to taking Advil p.m. every night. We'll continue on Protonix. Gastroenterology has been consulted and plans an EGD today. Patient has not had any bowel movements since yesterday. 2. Acute blood loss anemia. She has been transfused 2 unit of PRBC. Her hemoglobin has trended down since yesterday, although she has been receiving IV fluids and this may be dilutional. We'll continue to monitor serial CBCs. Check hemoglobin later this afternoon. 3. Atrial fibrillation. Status post pacemaker. Anticoagulation is currently on hold 4. Hypertension. Holding antihypertensives his blood pressure is low. 5. Hypokalemia. Replace. 6. CKD stage III. Renal function appears to be at baseline.  Code Status: DO NOT RESUSCITATE Family Communication: Discussed with patient  Disposition Plan: Possibly home tomorrow if remains stable.   Consultants:  Gastroenterology  Procedures:  None  Antibiotics:  None (indicate start date, and stop date if known)  HPI/Subjective: Does not feel well today. Did not sleep well last night. He reports that last bowel movement was yesterday and did not contain any blood. She's not had any vomiting.  Objective: Filed Vitals:   10/13/12 0403 10/13/12 0500 10/13/12 0600 10/13/12 0754  BP:  125/43 107/50   Pulse:  72    Temp:    98.5 F (36.9 C)  TempSrc:    Oral  Resp:  11 17   Height:      Weight: 68.9 kg (151 lb 14.4 oz)     SpO2:  97%      Intake/Output Summary (Last 24 hours) at 10/13/12 1018 Last data filed at 10/13/12 0600  Gross per 24 hour  Intake   1500 ml  Output    350 ml  Net   1150 ml   Filed Weights   10/12/12 0022 10/12/12 0500 10/13/12 0403  Weight: 67 kg (147 lb  11.3 oz) 67.7 kg (149 lb 4 oz) 68.9 kg (151 lb 14.4 oz)    Exam:   General:  No acute distress  Cardiovascular: S1, S2, regular rate and rhythm  Respiratory: Clear to auscultation bilaterally  Abdomen: Soft, nontender, nondistended, bowel sounds are active  Musculoskeletal: No pedal edema bilaterally   Data Reviewed: Basic Metabolic Panel:  Recent Labs Lab 10/06/12 2215 10/11/12 1134 10/11/12 2110 10/12/12 0454 10/13/12 0515  NA 127* 134* 133* 138 140  K 3.6 3.7 3.2* 3.4* 4.0  CL 91* 96 95* 102 110  CO2 21 26 25 24 21   GLUCOSE 116* 78 100* 93 93  BUN 94* 28* 28* 24* 13  CREATININE 1.51* 1.48* 1.51* 1.47* 1.29*  CALCIUM 8.8 8.4 8.6 8.5 8.4   Liver Function Tests:  Recent Labs Lab 10/12/12 0454  AST 17  ALT 8  ALKPHOS 52  BILITOT 0.7  PROT 5.9*  ALBUMIN 3.1*   No results found for this basename: LIPASE, AMYLASE,  in the last 168 hours No results found for this basename: AMMONIA,  in the last 168 hours CBC:  Recent Labs Lab 10/06/12 2215 10/11/12 1134 10/11/12 2110 10/12/12 0454 10/13/12 0515  WBC 12.0* 8.4 8.2 7.7 8.0  NEUTROABS 9.7* 6.5 5.6  --   --   HGB 9.2* 7.3* 6.7* 7.9* 7.2*  HCT 26.4* 20.5* 19.0* 22.5* 21.2*  MCV 94.6 94.5 94.5 90.4 93.0  PLT 191 288 260 250 236  Cardiac Enzymes:  Recent Labs Lab 10/06/12 2215  TROPONINI <0.30   BNP (last 3 results)  Recent Labs  11/27/11 0726  PROBNP 1500.0*   CBG: No results found for this basename: GLUCAP,  in the last 168 hours  Recent Results (from the past 240 hour(s))  URINE CULTURE     Status: None   Collection Time    10/11/12  9:25 PM      Result Value Range Status   Specimen Description URINE, CLEAN CATCH   Final   Special Requests NONE   Final   Culture  Setup Time 10/11/2012 21:50   Final   Colony Count NO GROWTH   Final   Culture NO GROWTH   Final   Report Status 10/12/2012 FINAL   Final  MRSA PCR SCREENING     Status: None   Collection Time    10/12/12 12:19 AM       Result Value Range Status   MRSA by PCR NEGATIVE  NEGATIVE Final   Comment:            The GeneXpert MRSA Assay (FDA     approved for NASAL specimens     only), is one component of a     comprehensive MRSA colonization     surveillance program. It is not     intended to diagnose MRSA     infection nor to guide or     monitor treatment for     MRSA infections.     Studies: Dg Chest 2 View  10/11/2012   *RADIOLOGY REPORT*  Clinical Data: Shortness of breath, weakness  CHEST - 2 VIEW  Comparison: 10/06/2012  Findings: Left subclavian AICD stable.  Cervical fixation hardware partially seen.  Heart size upper limits normal.  Lungs are hyperinflated, clear.  No effusion.  Mild vertebral compression fracture deformity in the lower thoracic spine stable. Atheromatous aortic arch.  IMPRESSION:  1.  Hyperinflation with stable chronic and postop changes.   Original Report Authenticated By: D. Wallace Going, MD    Scheduled Meds: . antiseptic oral rinse  15 mL Mouth Rinse BID  . pantoprazole (PROTONIX) IV  40 mg Intravenous BID AC  . potassium chloride  40 mEq Oral Once   Continuous Infusions: . 0.9 % NaCl with KCl 20 mEq / L 75 mL/hr at 10/13/12 0600    Active Problems:   Atrial fibrillation   Pacemaker   Hypertension   Hypokalemia   Acute blood loss anemia   Dizziness   CKD (chronic kidney disease) stage 3, GFR 30-59 ml/min   Chronic anticoagulation    Time spent: 35mins    [name]  [facility] Pager [phone]. If 7PM-7AM, please contact night-coverage at [email or web address], password [facility] 10/13/2012, 10:18 AM  LOS: 2 days

## 2012-10-13 NOTE — Progress Notes (Signed)
Contacted Rosalyn Gess, RN, to inform patient has a pacemaker. No defibrillator.

## 2012-10-13 NOTE — Progress Notes (Signed)
Report called and given to Clarise Cruz, Therapist, sports. Patient in endo and will be transported back from endo to Rm 315. Patient's belongings sent sent to room 315 after giving report.

## 2012-10-13 NOTE — Op Note (Signed)
[facility] [address] [place], [address]   ENDOSCOPY PROCEDURE REPORT  PATIENT: Kimberly [name], Kimberly [name]  MR#: [id number] BIRTHDATE: 02-17-1927 , 64  yrs. old GENDER: Female ENDOSCOPIST:  R.  Garfield Cornea, MD FACP FACG REFERRED BY:  Yaakov Guthrie, M.D. PROCEDURE DATE:  10/13/2012 PROCEDURE:     EGD with esophageal and gastric biopsy  INDICATIONS:     melena; drop in hemoglobin  INFORMED CONSENT:   The risks, benefits, limitations, alternatives and imponderables have been discussed.  The potential for biopsy, esophogeal dilation, etc. have also been reviewed.  Questions have been answered.  All parties agreeable.  Please see the history and physical in the medical record for more information.  MEDICATIONS:  Versed 3 mg IV and Demerol 50 mg IV in divided doses. Zofran 4 mg IV. Cetacaine spray.  DESCRIPTION OF PROCEDURE:   The [id number]  endoscope was introduced through the mouth and advanced to the second portion of the duodenum without difficulty or limitations.  The mucosal surfaces were surveyed very carefully during advancement of the scope and upon withdrawal.  Retroflexion view of the proximal stomach and esophagogastric junction was performed.      FINDINGS:  Extensive "geographic" ulceration of the distal 10 cm of the tubular esophagus. There was a 3 cm tongue of salmon-colored epithelium coming up from the GE junction suspicious for Barrett's esophagus. Stomach empty. Small hiatal hernia. A 9 mm deep prepyloric antral ulcer without bleeding stigmata. Extensive gastric erosions present. No obvious infiltrating process. Patent pylorus. Normal first and second portion of the duodenum  THERAPEUTIC / DIAGNOSTIC MANEUVERS PERFORMED:  Biopsies the abnormal distal esophagus and the radio gastric mucosa taken for histologic study   COMPLICATIONS:  None  IMPRESSION:   Severe ulcerative reflux esophagitis. Probable Barrett's esophagus-status post  biopsy. Hiatal hernia. Gastric erosions and a single deep prepyloric antral ulcer - Status post biopsy of the inflamed gastric mucosa  RECOMMENDATIONS:  Twice a day proton pump inhibitor therapy for 3 months with a repeat EGD to follow Followup on pathology. Absolutely refrain from taking all forms NSAIDs. Advance diet.    _______________________________ R. Garfield Cornea, MD FACP [facility] eSigned:  R. Garfield Cornea, MD FACP [facility] 10/13/2012 3:58 PM     CC:  PATIENT NAME:  Kimberly [name], Kimberly [name] MR#: [id number]

## 2012-10-13 NOTE — Progress Notes (Signed)
Patient complaining that IV site "sore and hurting". IV site WNL, with no redness, swelling or appearance of irritation. IV flushes easily and positive for blood return. NS with 20MEQ of K infusing at 75cc/hr. I offered to move the IV to another site which may not be as easily irritated but patient refused any attempts at placing another IV.

## 2012-10-14 ENCOUNTER — Encounter (HOSPITAL_COMMUNITY): Payer: Self-pay | Admitting: Internal Medicine

## 2012-10-14 ENCOUNTER — Telehealth: Payer: Self-pay | Admitting: Gastroenterology

## 2012-10-14 DIAGNOSIS — N183 Chronic kidney disease, stage 3 unspecified: Secondary | ICD-10-CM

## 2012-10-14 DIAGNOSIS — Z950 Presence of cardiac pacemaker: Secondary | ICD-10-CM

## 2012-10-14 LAB — BASIC METABOLIC PANEL
BUN: 13 mg/dL (ref 6–23)
CO2: 21 mEq/L (ref 19–32)
Chloride: 110 mEq/L (ref 96–112)
Creatinine, Ser: 1.32 mg/dL — ABNORMAL HIGH (ref 0.50–1.10)
GFR calc non Af Amer: 36 mL/min — ABNORMAL LOW (ref >90)

## 2012-10-14 LAB — CBC
HCT: 21.2 % — ABNORMAL LOW (ref 36.0–46.0)
MCV: 94.6 fL (ref 78.0–100.0)
RBC: 2.24 MIL/uL — ABNORMAL LOW (ref 3.87–5.11)
WBC: 10.1 10*3/uL (ref 4.0–10.5)

## 2012-10-14 MED ORDER — PANTOPRAZOLE SODIUM 40 MG PO TBEC
40.0000 mg | DELAYED_RELEASE_TABLET | Freq: Two times a day (BID) | ORAL | Status: DC
  Administered 2012-10-14 – 2012-10-15 (×2): 40 mg via ORAL
  Filled 2012-10-14 (×2): qty 1

## 2012-10-14 NOTE — Telephone Encounter (Signed)
Currently inpatient, will be discharged soon. Needs outpatient follow-up with me in about 2 months; need to set up repeat EGD for surveillance of ulcer.

## 2012-10-14 NOTE — Progress Notes (Signed)
Subjective: Patient states +DOE for past 3-4 days. No abdominal pain, no N/V. No overt signs of GI bleeding. Tolerating heart healthy diet.   Objective: Vital signs in last 24 hours: Temp:  [97.8 F (36.6 C)-98.6 F (37 C)] 97.8 F (36.6 C) (06/12 0622) Pulse Rate:  [65-109] 71 (06/12 0622) Resp:  [16-22] 20 (06/12 0622) BP: (94-153)/(37-68) 94/58 mmHg (06/12 0622) SpO2:  [90 %-100 %] 98 % (06/12 0622) Last BM Date: 10/12/12 General:   Alert and oriented, pleasant Head:  Normocephalic and atraumatic. Eyes:  No icterus, sclera clear. Conjuctiva pink.  Heart:  S1, S2 present, no murmurs noted.  Lungs: Clear to auscultation bilaterally, without wheezing, rales, or rhonchi.  Abdomen:  Bowel sounds present, soft, non-tender, non-distended. Question small umbilical hernia Extremities:  Without clubbing or edema. Neurologic:  Alert and  oriented x4;  grossly normal neurologically. Psych:  Alert and cooperative. Normal mood and affect.  Intake/Output from previous day: 06/11 0701 - 06/12 0700 In: -  Out: 700 [Urine:700] Intake/Output this shift:    Lab Results:  Recent Labs  10/13/12 0515 10/13/12 1337 10/14/12 0530  WBC 8.0 8.9 10.1  HGB 7.2* 7.7* 7.1*  HCT 21.2* 22.6* 21.2*  PLT 236 264 229   BMET  Recent Labs  10/12/12 0454 10/13/12 0515 10/14/12 0530  NA 138 140 137  K 3.4* 4.0 4.0  CL 102 110 110  CO2 24 21 21   GLUCOSE 93 93 102*  BUN 24* 13 13  CREATININE 1.47* 1.29* 1.32*  CALCIUM 8.5 8.4 8.3*   LFT  Recent Labs  10/12/12 0454  PROT 5.9*  ALBUMIN 3.1*  AST 17  ALT 8  ALKPHOS 52  BILITOT 0.7   PT/INR  Recent Labs  10/11/12 2110  LABPROT 15.2  INR 1.22     Assessment: Pleasant 79 year old female admitted with acute blood loss anemia and melena in the setting of Eliquis for afib and daily NSAIDs. EGD revealed severe ulcerative reflux esophagitis, probable Barrett's, gastric erosions, and a single deep prepyloric antral ulcer. Biopsies  pending. No overt signs of GI bleeding, with Hgb stable. However, patient is symptomatic and would benefit from transfusion. Hopeful discharge to home if she remains without overt signs of GI bleeding, and Hgb improves/remains stable. Dr. Anastasio Champion to order; will need to discuss timing of restarting Eliquis.   Plan: Follow-up pending biopsies from EGD PPI BID Avoid all NSAIDs Repeat EGD in 3 months for reassessment Will discuss with Dr. Gala Romney timing of Eliquis Hopeful discharge in next 24-48 hours  Orvil Feil, ANP-BC [facility]     LOS: 3 days    10/14/2012, 8:00 AM  Attending note: As outlined above. If feasible, would prefer her to wait 2 weeks prior to resuming Eliquis

## 2012-10-14 NOTE — Progress Notes (Signed)
Kimberly [name] [id number] DOB: [birth year] DOA: 10/11/2012 PCP: Anthoney Harada, MD   Subjective: This lady feels dyspneic on very minimal exertion. Her hemoglobin has been dropping. She has required one unit of blood. EGD yesterday did not show any active bleeding but was positive for severe ulcerative reflux esophagitis, gastric erosions and a prepyloric antral ulcer. Biopsies have been taken.           Physical Exam: Blood pressure 94/58, pulse 71, temperature 97.8 F (36.6 C), temperature source Oral, resp. rate 20, height 5\' 6"  (1.676 m), weight 68.9 kg (151 lb 14.4 oz), SpO2 98.00%. She looks systemically well. He is hemodynamically stable despite the soft blood pressure. Heart sounds are present and normal without murmurs or gallop rhythm. She is not clinically in heart failure. Lung fields are clear. Abdomen is soft and nontender. She is alert and orientated.   Investigations:  Recent Results (from the past 240 hour(s))  URINE CULTURE     Status: None   Collection Time    10/11/12  9:25 PM      Result Value Range Status   Specimen Description URINE, CLEAN CATCH   Final   Special Requests NONE   Final   Culture  Setup Time 10/11/2012 21:50   Final   Colony Count NO GROWTH   Final   Culture NO GROWTH   Final   Report Status 10/12/2012 FINAL   Final  MRSA PCR SCREENING     Status: None   Collection Time    10/12/12 12:19 AM      Result Value Range Status   MRSA by PCR NEGATIVE  NEGATIVE Final   Comment:            The GeneXpert MRSA Assay (FDA     approved for NASAL specimens     only), is one component of a     comprehensive MRSA colonization     surveillance program. It is not     intended to diagnose MRSA     infection nor to guide or     monitor treatment for     MRSA infections.     Basic Metabolic Panel:  Recent Labs  10/13/12 0515 10/14/12 0530  NA 140 137  K 4.0 4.0  CL 110 110  CO2 21 21  GLUCOSE 93 102*  BUN 13 13  CREATININE 1.29*  1.32*  CALCIUM 8.4 8.3*   Liver Function Tests:  Recent Labs  10/12/12 0454  AST 17  ALT 8  ALKPHOS 52  BILITOT 0.7  PROT 5.9*  ALBUMIN 3.1*     CBC:  Recent Labs  10/11/12 1134 10/11/12 2110  10/13/12 1337 10/14/12 0530  WBC 8.4 8.2  < > 8.9 10.1  NEUTROABS 6.5 5.6  --   --   --   HGB 7.3* 6.7*  < > 7.7* 7.1*  HCT 20.5* 19.0*  < > 22.6* 21.2*  MCV 94.5 94.5  < > 93.4 94.6  PLT 288 260  < > 264 229  < > = values in this interval not displayed.      Medications: I have reviewed the patient's current medications.  Impression: 1. Upper GI bleed secondary to severe ulcerative reflux esophagitis, gastric erosions and possibly a gastric ulcer. 2. Acute blood loss anemia secondary to #1. Hemoglobin is trending down to 7.1 today. 3. Hypertension. 4. Atrial fibrillation on anticoagulation with Eliquis, currently held. 5. Chronic kidney disease stage III. Stable. 6. Status post pacemaker.  Plan: 1. Give 2 units of blood today. 2. Advance diet. 3. Probable discharge home tomorrow.  Consultants:  Gastroenterology, Dr. Sydell Axon.   Procedures:  EGD, Dr. Sydell Axon.   Antibiotics:  None.                   Code Status: DO NOT RESUSCITATE.  Family Communication: Discussed plan with patient at the bedside.   Disposition Plan: Probable home tomorrow.  Time spent: 20 minutes.   LOS: 3 days   Doree Albee Pager [phone]  10/14/2012, 8:18 AM

## 2012-10-15 ENCOUNTER — Encounter: Payer: Self-pay | Admitting: Gastroenterology

## 2012-10-15 LAB — COMPREHENSIVE METABOLIC PANEL
Alkaline Phosphatase: 56 U/L (ref 39–117)
BUN: 14 mg/dL (ref 6–23)
Calcium: 8.5 mg/dL (ref 8.4–10.5)
GFR calc Af Amer: 45 mL/min — ABNORMAL LOW (ref >90)
Glucose, Bld: 106 mg/dL — ABNORMAL HIGH (ref 70–99)
Total Protein: 5.8 g/dL — ABNORMAL LOW (ref 6.0–8.3)

## 2012-10-15 LAB — TYPE AND SCREEN
Antibody Screen: NEGATIVE
Unit division: 0
Unit division: 0

## 2012-10-15 LAB — CBC
HCT: 29.7 % — ABNORMAL LOW (ref 36.0–46.0)
Hemoglobin: 10.4 g/dL — ABNORMAL LOW (ref 12.0–15.0)
MCH: 31.2 pg (ref 26.0–34.0)
MCHC: 35 g/dL (ref 30.0–36.0)
MCV: 89.2 fL (ref 78.0–100.0)

## 2012-10-15 MED ORDER — PANTOPRAZOLE SODIUM 40 MG PO TBEC: 40.0000 mg | DELAYED_RELEASE_TABLET | Freq: Two times a day (BID) | ORAL | Status: DC

## 2012-10-15 NOTE — Discharge Summary (Signed)
Physician Discharge Summary  Kimberly [name] [id number] DOB: 02/11/1927 DOA: 10/11/2012  PCP: Anthoney Harada, MD  Admit date: 10/11/2012 Discharge date: 10/15/2012  Time spent: Greater than 30 minutes  Recommendations for Outpatient Follow-up:  1. Followup with gastroenterology, Dr. Sydell Axon in 2 months.   Discharge Diagnoses:  1. Upper GI bleed secondary to severe ulcerative reflux esophagitis, gastric erosions and gastric antral ulcer. Biopsies have been taken. Repeat EGD in approximately 2 months. 2. Acute blood loss anemia secondary to #1. Status post 2 units blood transfusion. 3. Atrial fibrillation, status post pacemaker, had been on anticoagulation. Anticoagulation has been held and should be restarted in 2 weeks. 4. Hypertension. 5. Chronic kidney disease stage III.   Discharge Condition: Stable and improved.  Diet recommendation: Regular.  Filed Weights   10/12/12 0022 10/12/12 0500 10/13/12 0403  Weight: 67 kg (147 lb 11.3 oz) 67.7 kg (149 lb 4 oz) 68.9 kg (151 lb 14.4 oz)    History of present illness:  This very pleasant 79 year old lady presented to the hospital with symptoms of dizziness. Please see initial history as outlined below: HPI:  79 year old female who came to the ED with generalized weakness and dizziness going on for past one week. It is associated with shortness of breath on exertion and lightheadedness, she denies chest pain. Denies nausea vomiting or diarrhea. Patient did not notice any blood in the stools. In the ED patient was found to be severely anemic with hemoglobin of 6.7, patient takes Eliquis for atrial fibrillation. And also takes Advil p.m. almost every night for sleep. She denies history of gastric ulcers. Patient had colonoscopy 2 years ago as per patient, and it was normal.  Hospital Course:  The patient was found to have a hemoglobin of 6.7. Fecal occult blood testing was positive. Her anticoagulation therapy, Eliquis, was held. She  required 4 units blood transfusion in total. She underwent EGD with findings listed below. She did not have any active bleeding. She feels much improved now. She is stable for discharge. She has been told not to take any further nonsteroidal anti-inflammatory medications. She needs to be on Protonix 40 mg twice a day for 3 months and a repeat EGD will need to be done in approximately couple of months.  Procedures:  EGD by Dr. Sydell Axon: IMPRESSION: Severe ulcerative reflux esophagitis. Probable  Barrett's esophagus-status post biopsy. Hiatal hernia. Gastric  erosions and a single deep prepyloric antral ulcer - Status post  biopsy of the inflamed gastric mucosa  RECOMMENDATIONS: Twice a day proton pump inhibitor therapy for 3  months with a repeat EGD to follow  Followup on pathology. Absolutely refrain from taking all forms  NSAIDs.  Consultations:  Gastroneurology, Dr. Sydell Axon.  Discharge Exam: Filed Vitals:   10/14/12 1541 10/14/12 1542 10/14/12 2235 10/15/12 0602  BP: 122/61 129/75 118/41 127/52  Pulse: 76 77 72 90  Temp: 97.8 F (36.6 C) 97.5 F (36.4 C) 97.4 F (36.3 C) 97.8 F (36.6 C)  TempSrc: Oral Oral Oral Oral  Resp: 20 22 20 20   Height:      Weight:      SpO2: 90% 99% 100% 98%    General: She looks systemically well. She is no longer pale. Cardiovascular: Heart sounds are present and in atrial fibrillation. She is not clinically in heart failure. Respiratory: Lung fields are clear. She is alert and orientated.  Discharge Instructions  Discharge Orders   Future Orders Complete By Expires     Diet - low sodium heart  healthy  As directed     Diet - low sodium heart healthy  As directed     Discharge instructions  As directed     Comments:      Restart your blood thinning medicine, Eliquis in 2 weeks.    Increase activity slowly  As directed     Increase activity slowly  As directed         Medication List    STOP taking these medications       ibuprofen  200 MG tablet  Commonly known as:  ADVIL,MOTRIN      TAKE these medications       apixaban 2.5 MG Tabs tablet  Commonly known as:  ELIQUIS  Take 1 tablet (2.5 mg total) by mouth 2 (two) times daily.     carvedilol 6.25 MG tablet  Commonly known as:  COREG  Take 6.25 mg by mouth 2 (two) times daily with a meal.     ferrous sulfate 325 (65 FE) MG tablet  Take 325 mg by mouth every morning.     furosemide 40 MG tablet  Commonly known as:  LASIX  Take 40 mg by mouth every morning.     HYDROcodone-acetaminophen 5-325 MG per tablet  Commonly known as:  NORCO/VICODIN  Take 1 tablet by mouth 3 (three) times daily as needed for pain.     pantoprazole 40 MG tablet  Commonly known as:  PROTONIX  Take 1 tablet (40 mg total) by mouth 2 (two) times daily before a meal.       Allergies  Allergen Reactions  . Iodine Other (See Comments)    REACTION:If injected,  Rash/irritated skin reaction "welts"  . Penicillins Hives  . Sulfa Antibiotics Rash       Follow-up Information   Follow up with Manus Rudd, MD. Schedule an appointment as soon as possible for a visit in 2 months.   Contact information:   [place] [address] [phone]        The results of significant diagnostics from this hospitalization (including imaging, microbiology, ancillary and laboratory) are listed below for reference.    Significant Diagnostic Studies: Dg Chest 2 View  10/11/2012   *RADIOLOGY REPORT*  Clinical Data: Shortness of breath, weakness  CHEST - 2 VIEW  Comparison: 10/06/2012  Findings: Left subclavian AICD stable.  Cervical fixation hardware partially seen.  Heart size upper limits normal.  Lungs are hyperinflated, clear.  No effusion.  Mild vertebral compression fracture deformity in the lower thoracic spine stable. Atheromatous aortic arch.  IMPRESSION:  1.  Hyperinflation with stable chronic and postop changes.   Original Report Authenticated By:  D. Wallace Going, MD   Dg Chest Port 1 View  10/06/2012   *RADIOLOGY REPORT*  Clinical Data: Shortness of breath, history of hypertension and atrial fibrillation  PORTABLE CHEST - 1 VIEW  Comparison: 02/12/2012  Findings: Left-sided AICD in place.  Cervical fusion hardware partly visualized.  Biapical pleural thickening noted.  Lungs are well aerated without focal pulmonary opacity.  Heart size normal. Aorta is ectatic and unfolded.  No pleural effusion.  Left costophrenic angle is omitted from the field of view.  IMPRESSION: No acute cardiopulmonary process.   Original Report Authenticated By: Conchita Paris, M.D.    Microbiology: Recent Results (from the past 240 hour(s))  URINE CULTURE     Status: None   Collection Time    10/11/12  9:25 PM  Result Value Range Status   Specimen Description URINE, CLEAN CATCH   Final   Special Requests NONE   Final   Culture  Setup Time 10/11/2012 21:50   Final   Colony Count NO GROWTH   Final   Culture NO GROWTH   Final   Report Status 10/12/2012 FINAL   Final  MRSA PCR SCREENING     Status: None   Collection Time    10/12/12 12:19 AM      Result Value Range Status   MRSA by PCR NEGATIVE  NEGATIVE Final   Comment:            The GeneXpert MRSA Assay (FDA     approved for NASAL specimens     only), is one component of a     comprehensive MRSA colonization     surveillance program. It is not     intended to diagnose MRSA     infection nor to guide or     monitor treatment for     MRSA infections.     Labs: Basic Metabolic Panel:  Recent Labs Lab 10/11/12 2110 10/12/12 0454 10/13/12 0515 10/14/12 0530 10/15/12 0510  NA 133* 138 140 137 137  K 3.2* 3.4* 4.0 4.0 3.9  CL 95* 102 110 110 106  CO2 25 24 21 21 20   GLUCOSE 100* 93 93 102* 106*  BUN 28* 24* 13 13 14   CREATININE 1.51* 1.47* 1.29* 1.32* 1.25*  CALCIUM 8.6 8.5 8.4 8.3* 8.5   Liver Function Tests:  Recent Labs Lab 10/12/12 0454 10/15/12 0510  AST 17 15  ALT 8 6   ALKPHOS 52 56  BILITOT 0.7 0.6  PROT 5.9* 5.8*  ALBUMIN 3.1* 2.7*     CBC:  Recent Labs Lab 10/11/12 1134 10/11/12 2110 10/12/12 0454 10/13/12 0515 10/13/12 1337 10/14/12 0530 10/15/12 0510  WBC 8.4 8.2 7.7 8.0 8.9 10.1 8.1  NEUTROABS 6.5 5.6  --   --   --   --   --   HGB 7.3* 6.7* 7.9* 7.2* 7.7* 7.1* 10.4*  HCT 20.5* 19.0* 22.5* 21.2* 22.6* 21.2* 29.7*  MCV 94.5 94.5 90.4 93.0 93.4 94.6 89.2  PLT 288 260 250 236 264 229 236     BNP: BNP (last 3 results)  Recent Labs  11/27/11 0726  PROBNP 1500.0*         Signed:  [place] C  [facility] 10/15/2012, 8:21 AM

## 2012-10-15 NOTE — Progress Notes (Signed)
Charge nurse decided to take patient home in order to save patient the $50 that the cab service was going to charge her. Schonewitz, Eulis Canner 10/15/2012

## 2012-10-15 NOTE — Progress Notes (Signed)
UR chart review completed.  

## 2012-10-15 NOTE — Progress Notes (Signed)
D.c instructions reviewed with patient.  Verbalized understanding.  Pt to be dc'd to home by herself via taxi.  Social worker contacted for number to taxi service. Schonewitz, Eulis Canner 10/15/2012

## 2012-10-15 NOTE — Care Management Note (Signed)
    Page 1 of 1   10/15/2012     10:14:28 AM   CARE MANAGEMENT NOTE 10/15/2012  Patient:  JACYLN, PETHTEL   Account Number:  [id number]  Date Initiated:  10/12/2012  Documentation initiated by:  Vladimir Creeks  Subjective/Objective Assessment:   Admitted with a GIB, with heme + stool. pt on Eliquis for Afib. she lives at home alone, and is independent in the home. She does not drive, and gets friends and neigbors to take her out as needed.     Action/Plan:   May need HH at D/C if frequent labs are needed, and pt agrees to this, since she does not drive, and traveling is difficult   Anticipated DC Date:  10/14/2012   Anticipated DC Plan:  [place]  CM consult      Choice offered to / List presented to:             Status of service:  Completed, signed off Medicare Important Message given?  YES (If response is "NO", the following Medicare IM given date fields will be blank) Date Medicare IM given:  10/15/2012 Date Additional Medicare IM given:    Discharge Disposition:  HOME/SELF CARE  Per UR Regulation:  Reviewed for med. necessity/level of care/duration of stay  If discussed at [place] of Stay Meetings, dates discussed:    Comments:  10/15/12 [place], RN BSN CM Pt discharged home today. Pt denies HH at this time. No other CM needs noted. Pt will be provided taxi cab number for transportation home. Pt refuses to use [employer] or [employer]  10/12/12 1445  Vladimir Creeks RN/CM

## 2012-10-17 ENCOUNTER — Encounter: Payer: Self-pay | Admitting: Internal Medicine

## 2012-10-20 ENCOUNTER — Telehealth: Payer: Self-pay | Admitting: Pharmacist

## 2012-10-20 NOTE — Telephone Encounter (Signed)
Pt is aware of OV on 8/13 at 10 with AS

## 2012-11-01 ENCOUNTER — Other Ambulatory Visit (INDEPENDENT_AMBULATORY_CARE_PROVIDER_SITE_OTHER): Payer: Medicare Other

## 2012-11-01 DIAGNOSIS — D62 Acute posthemorrhagic anemia: Secondary | ICD-10-CM

## 2012-11-01 LAB — CBC WITH DIFFERENTIAL/PLATELET
Basophils Absolute: 0 10*3/uL (ref 0.0–0.1)
HCT: 35.1 % — ABNORMAL LOW (ref 36.0–46.0)
Hemoglobin: 11.7 g/dL — ABNORMAL LOW (ref 12.0–15.0)
Lymphocytes Relative: 17 % (ref 12–46)
Monocytes Absolute: 0.3 10*3/uL (ref 0.1–1.0)
Neutro Abs: 3.8 10*3/uL (ref 1.7–7.7)
RDW: 17.5 % — ABNORMAL HIGH (ref 11.5–15.5)
WBC: 5.1 10*3/uL (ref 4.0–10.5)

## 2012-11-01 LAB — VITAMIN B12: Vitamin B-12: 363 pg/mL (ref 211–911)

## 2012-11-04 ENCOUNTER — Telehealth: Payer: Self-pay | Admitting: Pharmacist

## 2012-11-04 MED ORDER — DIPHENHYDRAMINE HCL 12.5 MG PO TBDP: 1.0000 | ORAL_TABLET | Freq: Every evening | ORAL | Status: DC | PRN

## 2012-11-04 NOTE — Telephone Encounter (Signed)
Message copied by Marin Olp on Thu Nov 04, 2012  2:25 PM ------      Message from: Zannie Cove      Created: Thu Nov 04, 2012  8:45 AM                   ----- Message -----         From: Chipper Herb, MD         Sent: 11/02/2012   7:32 AM           To: Huntley Dec, LPN            Not sure who follows this patient in our practice.      The CBC has a normal white blood cell count. Hemoglobin is 11.7 which is slightly improved from 2 weeks ago when it was 10.4. Platelet count is adequate      B12 is within normal limit       ------

## 2012-11-04 NOTE — Telephone Encounter (Signed)
Pt notified of results

## 2012-11-04 NOTE — Telephone Encounter (Signed)
Copy of CBC mailed to pt

## 2012-11-04 NOTE — Telephone Encounter (Signed)
Please send copy of CBC the patient or have her come by and pick in the

## 2012-11-04 NOTE — Telephone Encounter (Signed)
Arbie Cookey called regarding patient's insomnia.  She previously took temazepam for sleep with good results but hasn't had in about 1 year.  She also took Aleve PM but recently has GI bleed thought to be related to NSAID use.  Discussed with Dr. Redge Gainer - recommended try diphenhydramine 12.5mg  to 25mg  at bedtime.  Bradd Canary notified of recommendation

## 2012-11-04 NOTE — Telephone Encounter (Signed)
Received VM from Bradd Canary, nurse with [facility].  She has been doing home visits with Kimberly [name].  Patient was on Eliquis and experienced a GI bleed which GI felt was more related to her use of OTC NSIADS and not Eliquis.  Per Arbie Cookey GI has said OK to restart Eliquis but in conversation with Arbie Cookey we want to ensure that HBG is stable before we do so.  Most recent HBG is improved but slightly  Low.  Arbie Cookey is scheduled to see Mrs. Renzo again at the end of July.  Will recheck CBC at that visit and if stable will restart Eliquis at that time.

## 2012-11-10 ENCOUNTER — Other Ambulatory Visit: Payer: Self-pay | Admitting: Cardiovascular Disease

## 2012-11-10 NOTE — Telephone Encounter (Signed)
Rx was sent to pharmacy electronically. 

## 2012-11-25 NOTE — Telephone Encounter (Signed)
Message left

## 2012-11-29 ENCOUNTER — Other Ambulatory Visit (INDEPENDENT_AMBULATORY_CARE_PROVIDER_SITE_OTHER): Payer: Medicare Other

## 2012-11-29 DIAGNOSIS — D500 Iron deficiency anemia secondary to blood loss (chronic): Secondary | ICD-10-CM

## 2012-11-30 LAB — ANEMIA PROFILE B
Basophils Absolute: 0 10*3/uL (ref 0.0–0.2)
Basos: 0 % (ref 0–3)
Eos: 1 % (ref 0–5)
Eosinophils Absolute: 0.1 10*3/uL (ref 0.0–0.4)
Ferritin: 53 ng/mL (ref 15–150)
HCT: 35.4 % (ref 34.0–46.6)
Hemoglobin: 11.7 g/dL (ref 11.1–15.9)
Immature Grans (Abs): 0 10*3/uL (ref 0.0–0.1)
Immature Granulocytes: 0 % (ref 0–2)
Lymphocytes Absolute: 1.2 10*3/uL (ref 0.7–3.1)
MCH: 31.4 pg (ref 26.6–33.0)
MCV: 95 fL (ref 79–97)
Neutrophils Absolute: 7 10*3/uL (ref 1.4–7.0)
RBC: 3.73 x10E6/uL — ABNORMAL LOW (ref 3.77–5.28)
Retic Ct Pct: 1.6 % (ref 0.6–2.6)
TIBC: 356 ug/dL (ref 250–450)
UIBC: 271 ug/dL (ref 150–375)

## 2012-11-30 LAB — BMP8+EGFR
CO2: 20 mmol/L (ref 18–29)
Calcium: 9.2 mg/dL (ref 8.6–10.2)
Chloride: 98 mmol/L (ref 97–108)
GFR calc non Af Amer: 46 mL/min/{1.73_m2} — ABNORMAL LOW (ref >59)
Potassium: 3.8 mmol/L (ref 3.5–5.2)
Sodium: 137 mmol/L (ref 134–144)

## 2012-12-09 ENCOUNTER — Telehealth: Payer: Self-pay | Admitting: Pharmacist

## 2012-12-09 NOTE — Telephone Encounter (Signed)
Called patient and instructed to restart eliquis 2.5mg  BID.  Will have Bradd Canary, NP recheck CBC monthly for first 3 months and then q3 months for first year.

## 2012-12-09 NOTE — Telephone Encounter (Signed)
Message copied by Cherre Robins on Thu Dec 09, 2012  4:48 PM ------      Message from: Zannie Cove      Created: Thu Dec 02, 2012  4:54 PM       Pt wants to know what she she do with her blood thinner? Shereese Bonnie controls this ------

## 2012-12-15 ENCOUNTER — Ambulatory Visit: Payer: Medicare Other | Admitting: Gastroenterology

## 2012-12-20 ENCOUNTER — Other Ambulatory Visit (INDEPENDENT_AMBULATORY_CARE_PROVIDER_SITE_OTHER): Payer: Medicare Other

## 2012-12-20 DIAGNOSIS — D649 Anemia, unspecified: Secondary | ICD-10-CM

## 2012-12-20 NOTE — Progress Notes (Signed)
Dropped off from [facility] patient seen here

## 2012-12-21 LAB — BMP8+EGFR
Calcium: 8.8 mg/dL (ref 8.6–10.2)
Chloride: 97 mmol/L (ref 97–108)
GFR calc non Af Amer: 42 mL/min/{1.73_m2} — ABNORMAL LOW (ref >59)
Potassium: 3.7 mmol/L (ref 3.5–5.2)
Sodium: 136 mmol/L (ref 134–144)

## 2012-12-26 ENCOUNTER — Encounter (HOSPITAL_COMMUNITY): Payer: Self-pay | Admitting: *Deleted

## 2012-12-26 ENCOUNTER — Emergency Department (HOSPITAL_COMMUNITY): Payer: Medicare Other

## 2012-12-26 ENCOUNTER — Emergency Department (HOSPITAL_COMMUNITY)
Admission: EM | Admit: 2012-12-26 | Discharge: 2012-12-26 | Disposition: A | Discharge status: Home / Self Care | Payer: Medicare Other | Attending: Emergency Medicine | Admitting: Emergency Medicine

## 2012-12-26 DIAGNOSIS — Z79899 Other long term (current) drug therapy: Secondary | ICD-10-CM | POA: Insufficient documentation

## 2012-12-26 DIAGNOSIS — I10 Essential (primary) hypertension: Secondary | ICD-10-CM | POA: Insufficient documentation

## 2012-12-26 DIAGNOSIS — Z880 Allergy status to penicillin: Secondary | ICD-10-CM | POA: Insufficient documentation

## 2012-12-26 DIAGNOSIS — Z9889 Other specified postprocedural states: Secondary | ICD-10-CM | POA: Insufficient documentation

## 2012-12-26 DIAGNOSIS — R109 Unspecified abdominal pain: Secondary | ICD-10-CM | POA: Insufficient documentation

## 2012-12-26 DIAGNOSIS — Z9089 Acquired absence of other organs: Secondary | ICD-10-CM | POA: Insufficient documentation

## 2012-12-26 DIAGNOSIS — G8929 Other chronic pain: Secondary | ICD-10-CM | POA: Insufficient documentation

## 2012-12-26 DIAGNOSIS — I4891 Unspecified atrial fibrillation: Secondary | ICD-10-CM | POA: Insufficient documentation

## 2012-12-26 DIAGNOSIS — Z87891 Personal history of nicotine dependence: Secondary | ICD-10-CM | POA: Insufficient documentation

## 2012-12-26 DIAGNOSIS — M545 Low back pain, unspecified: Secondary | ICD-10-CM | POA: Insufficient documentation

## 2012-12-26 DIAGNOSIS — R300 Dysuria: Secondary | ICD-10-CM | POA: Insufficient documentation

## 2012-12-26 DIAGNOSIS — R350 Frequency of micturition: Secondary | ICD-10-CM | POA: Insufficient documentation

## 2012-12-26 DIAGNOSIS — Z8659 Personal history of other mental and behavioral disorders: Secondary | ICD-10-CM | POA: Insufficient documentation

## 2012-12-26 DIAGNOSIS — R3919 Other difficulties with micturition: Secondary | ICD-10-CM | POA: Insufficient documentation

## 2012-12-26 DIAGNOSIS — Z8739 Personal history of other diseases of the musculoskeletal system and connective tissue: Secondary | ICD-10-CM | POA: Insufficient documentation

## 2012-12-26 DIAGNOSIS — N949 Unspecified condition associated with female genital organs and menstrual cycle: Secondary | ICD-10-CM | POA: Insufficient documentation

## 2012-12-26 DIAGNOSIS — Z950 Presence of cardiac pacemaker: Secondary | ICD-10-CM | POA: Insufficient documentation

## 2012-12-26 DIAGNOSIS — Z90710 Acquired absence of both cervix and uterus: Secondary | ICD-10-CM | POA: Insufficient documentation

## 2012-12-26 LAB — URINALYSIS, ROUTINE W REFLEX MICROSCOPIC
Bilirubin Urine: NEGATIVE
Glucose, UA: NEGATIVE mg/dL
Hgb urine dipstick: NEGATIVE
Specific Gravity, Urine: 1.01 (ref 1.005–1.030)
Urobilinogen, UA: 0.2 mg/dL (ref 0.0–1.0)
pH: 7 (ref 5.0–8.0)

## 2012-12-26 LAB — CBC WITH DIFFERENTIAL/PLATELET
Eosinophils Absolute: 0.1 10*3/uL (ref 0.0–0.7)
Eosinophils Relative: 2 % (ref 0–5)
HCT: 35.8 % — ABNORMAL LOW (ref 36.0–46.0)
Hemoglobin: 12.2 g/dL (ref 12.0–15.0)
Lymphs Abs: 1.2 10*3/uL (ref 0.7–4.0)
MCH: 33 pg (ref 26.0–34.0)
MCV: 96.8 fL (ref 78.0–100.0)
Monocytes Relative: 8 % (ref 3–12)
RBC: 3.7 MIL/uL — ABNORMAL LOW (ref 3.87–5.11)

## 2012-12-26 LAB — BASIC METABOLIC PANEL
BUN: 17 mg/dL (ref 6–23)
CO2: 28 mEq/L (ref 19–32)
Calcium: 9.3 mg/dL (ref 8.4–10.5)
Creatinine, Ser: 1.21 mg/dL — ABNORMAL HIGH (ref 0.50–1.10)
GFR calc non Af Amer: 40 mL/min — ABNORMAL LOW (ref >90)
Glucose, Bld: 110 mg/dL — ABNORMAL HIGH (ref 70–99)
Sodium: 136 mEq/L (ref 135–145)

## 2012-12-26 MED ORDER — SODIUM CHLORIDE 0.9 % IV SOLN
INTRAVENOUS | Status: DC
  Administered 2012-12-26: 12:00:00 via INTRAVENOUS

## 2012-12-26 MED ORDER — IOHEXOL 300 MG/ML  SOLN
50.0000 mL | Freq: Once | INTRAMUSCULAR | Status: AC | PRN
  Administered 2012-12-26: 50 mL via ORAL

## 2012-12-26 MED ORDER — IOHEXOL 300 MG/ML  SOLN
80.0000 mL | Freq: Once | INTRAMUSCULAR | Status: AC | PRN
  Administered 2012-12-26: 80 mL via INTRAVENOUS

## 2012-12-26 MED ORDER — PHENAZOPYRIDINE HCL 100 MG PO TABS
200.0000 mg | ORAL_TABLET | Freq: Once | ORAL | Status: AC
  Administered 2012-12-26: 200 mg via ORAL
  Filled 2012-12-26: qty 2

## 2012-12-26 MED ORDER — CIPROFLOXACIN HCL 500 MG PO TABS: 500.0000 mg | ORAL_TABLET | Freq: Two times a day (BID) | ORAL | Status: DC

## 2012-12-26 MED ORDER — PHENAZOPYRIDINE HCL 200 MG PO TABS: 200.0000 mg | ORAL_TABLET | Freq: Three times a day (TID) | ORAL | Status: DC

## 2012-12-26 NOTE — ED Notes (Signed)
Pt states that for the past few weeks she has been having urinate more frequently, having vaginal pain and lower back pain.

## 2012-12-26 NOTE — ED Notes (Signed)
Patient returned from CT.  No distress.

## 2012-12-26 NOTE — ED Provider Notes (Signed)
CSN: [id number]     Arrival date & time 12/26/12  1052 History    This chart was scribed for Janice Norrie, MD,  by Stacy Gardner, ED Scribe. The patient was seen in room APA05/AP[facility]05 and the patient's care was started at 11:21 AM.   First MD Initiated Contact with Patient 12/26/12 1111     Chief Complaint  Patient presents with  . Urinary Tract Infection  . Back Pain   (Consider location/radiation/quality/duration/timing/severity/associated sxs/prior Treatment) Patient is a 79 y.o. female presenting with urinary tract infection and back pain. The history is provided by the patient and medical records. No language interpreter was used.  Urinary Tract Infection This is a new problem. The problem occurs constantly. The problem has not changed since onset.Associated symptoms include abdominal pain. Nothing aggravates the symptoms. Nothing relieves the symptoms.  Back Pain Associated symptoms: abdominal pain and dysuria    HPI Comments: LANAJA VILLELA is a 79 y.o. female who presents to the Emergency Department complaining of urinary tract infection back pain with onset of a few weeks ago and has not diminished. Pt mentions the associated symptoms of dysuria, vaginal pain and frequency. Pt also mentions generalized abdominal pain with onset of a few month PTA.  She describes the UTI associated pain as her " bladder falling out of her vagina" and back pain as severe and relieved by nothing. Pt lives alone at home and reports doing her ADL's independently.    Patient presents to the emergency department complaining of "bladder problems". She states she had a hysterectomy 40 years ago and she feels like her bladder has dropped about 2 or 3 weeks ago. She did describes frequency with small amounts of urine, nocturia about 6 times a night, and some suprapubic and lower abdominal pain for the past few months. She also states she feels like her abdomen is bloated. She denies dysuria, nausea, vomiting, or  fever.  Pt very seldomly drink alcohol and denies smoking.   PCP Dr Jacelyn Grip Cardiology Dr Sallyanne Kuster   Past Medical History  Diagnosis Date  . Depression   . Atrial fibrillation   . Scoliosis   . Osteoarthritis   . Pacemaker   . Fibromyalgia   . Hypertension    Past Surgical History  Procedure Laterality Date  . Pacemaker insertion    . Abdominal hysterectomy    . Cholecystectomy    . Appendectomy    . Breast surgery    . Back surgery    . Neck surgery    . Hernia repair      right inguinal hernia and umbilical  . Tonsillectomy    . Esophagogastroduodenoscopy N/A 10/13/2012    Procedure: ESOPHAGOGASTRODUODENOSCOPY (EGD);  Surgeon: Daneil Dolin, MD;  Location: [facility];  Service: Endoscopy;  Laterality: N/A;   Family History  Problem Relation Age of Onset  . Cancer Sister   . Asthma Sister   . Heart failure Brother   . Colon cancer Neg Hx    History  Substance Use Topics  . Smoking status: Former Smoker    Quit date: 12/13/2004  . Smokeless tobacco: Not on file  . Alcohol Use: 0.6 oz/week    1 Glasses of wine per week     Comment: several glasses of wine each evening  lives at home Lives alone   [place] History   Grav Para Term Preterm Abortions TAB SAB Ect Mult Living   5 2 1 1 3  3    2  Review of Systems  Constitutional: Negative for chills and fatigue.  Gastrointestinal: Positive for abdominal pain and abdominal distention. Negative for nausea and vomiting.  Genitourinary: Positive for dysuria, frequency, difficulty urinating and vaginal pain. Negative for hematuria and vaginal bleeding.  Musculoskeletal: Positive for back pain (lower back, chronic).  All other systems reviewed and are negative.    Allergies  Iodine; Penicillins; and Sulfa antibiotics  Home Medications   Current Outpatient Rx  Name  Route  Sig  Dispense  Refill  . apixaban (ELIQUIS) 2.5 MG TABS tablet   Oral   Take 1 tablet (2.5 mg total) by mouth 2 (two) times daily.   180  tablet   0   . carvedilol (COREG) 6.25 MG tablet   Oral   Take 6.25 mg by mouth 2 (two) times daily with a meal.         . furosemide (LASIX) 20 MG tablet   Oral   Take 40 mg by mouth daily.         [name] HYDROcodone-acetaminophen (NORCO/VICODIN) 5-325 MG per tablet   Oral   Take 1 tablet by mouth 2 (two) times daily.          [name] omeprazole (PRILOSEC) 20 MG capsule   Oral   Take 20 mg by mouth daily.          BP 107/58  Pulse 71  Temp(Src) 97.9 F (36.6 C) (Oral)  Resp 20  Ht 5\' 4"  (1.626 m)  SpO2 99%  Vital signs normal   Physical Exam  Nursing note and vitals reviewed. Constitutional: She is oriented to person, place, and time. She appears well-developed and well-nourished.  Non-toxic appearance. She does not appear ill. No distress.  HENT:  Head: Normocephalic and atraumatic.  Right Ear: External ear normal.  Left Ear: External ear normal.  Nose: Nose normal. No mucosal edema or rhinorrhea.  Mouth/Throat: Oropharynx is clear and moist and mucous membranes are normal. No dental abscesses or edematous.  Eyes: Conjunctivae and EOM are normal. Pupils are equal, round, and reactive to light.  Neck: Normal range of motion and full passive range of motion without pain. Neck supple.  Cardiovascular: Normal rate, regular rhythm and normal heart sounds.  Exam reveals no gallop and no friction rub.   No murmur heard. Pulmonary/Chest: Effort normal and breath sounds normal. No respiratory distress. She has no wheezes. She has no rhonchi. She has no rales. She exhibits no tenderness and no crepitus.  Abdominal: Soft. Normal appearance and bowel sounds are normal. She exhibits no distension. There is tenderness (diffuse). There is no rebound and no guarding.    Genitourinary: Vagina normal.  External introitus  is normal No obvious bladder seen in her vaginal vault both laying down and standing  Musculoskeletal: Normal range of motion. She exhibits no edema and no  tenderness.  Moves all extremities well.   Neurological: She is alert and oriented to person, place, and time. She has normal strength. No cranial nerve deficit.  Skin: Skin is warm, dry and intact. No rash noted. No erythema. No pallor.  Psychiatric: She has a normal mood and affect. Her speech is normal and behavior is normal. Her mood appears not anxious.    ED Course   Medications  0.9 %  sodium chloride infusion ( Intravenous New Bag/Given 12/26/12 1148)  phenazopyridine (PYRIDIUM) tablet 200 mg (not administered)  iohexol (OMNIPAQUE) 300 MG/ML solution 50 mL (50 mLs Oral Contrast Given 12/26/12 1254)  iohexol (OMNIPAQUE) 300 MG/ML  solution 80 mL (80 mLs Intravenous Contrast Given 12/26/12 1253)   DIAGNOSTIC STUDIES: Oxygen Saturation is 99% on RA which is normal by my interpretation.    COORDINATION OF CARE: 11:23 AM Discussed course of care with pt . Pt understands and agrees.  UA is norma, AP CT done b/o her abdominal pain complaints.   Pt given results of her tests.   Procedures (including critical care time)  Results for orders placed during the hospital encounter of 12/26/12  URINALYSIS, ROUTINE W REFLEX MICROSCOPIC      Result Value Range   Color, Urine YELLOW  YELLOW   APPearance CLEAR  CLEAR   Specific Gravity, Urine 1.010  1.005 - 1.030   pH 7.0  5.0 - 8.0   Glucose, UA NEGATIVE  NEGATIVE mg/dL   Hgb urine dipstick NEGATIVE  NEGATIVE   Bilirubin Urine NEGATIVE  NEGATIVE   Ketones, ur NEGATIVE  NEGATIVE mg/dL   Protein, ur NEGATIVE  NEGATIVE mg/dL   Urobilinogen, UA 0.2  0.0 - 1.0 mg/dL   Nitrite NEGATIVE  NEGATIVE   Leukocytes, UA NEGATIVE  NEGATIVE  CBC WITH DIFFERENTIAL      Result Value Range   WBC 7.9  4.0 - 10.5 K/uL   RBC 3.70 (*) 3.87 - 5.11 MIL/uL   Hemoglobin 12.2  12.0 - 15.0 g/dL   HCT 35.8 (*) 36.0 - 46.0 %   MCV 96.8  78.0 - 100.0 fL   MCH 33.0  26.0 - 34.0 pg   MCHC 34.1  30.0 - 36.0 g/dL   RDW 15.6 (*) 11.5 - 15.5 %   Platelets 208  150 -  400 K/uL   Neutrophils Relative % 76  43 - 77 %   Neutro Abs 6.0  1.7 - 7.7 K/uL   Lymphocytes Relative 15  12 - 46 %   Lymphs Abs 1.2  0.7 - 4.0 K/uL   Monocytes Relative 8  3 - 12 %   Monocytes Absolute 0.6  0.1 - 1.0 K/uL   Eosinophils Relative 2  0 - 5 %   Eosinophils Absolute 0.1  0.0 - 0.7 K/uL   Basophils Relative 0  0 - 1 %   Basophils Absolute 0.0  0.0 - 0.1 K/uL  BASIC METABOLIC PANEL      Result Value Range   Sodium 136  135 - 145 mEq/L   Potassium 3.8  3.5 - 5.1 mEq/L   Chloride 99  96 - 112 mEq/L   CO2 28  19 - 32 mEq/L   Glucose, Bld 110 (*) 70 - 99 mg/dL   BUN 17  6 - 23 mg/dL   Creatinine, Ser 1.21 (*) 0.50 - 1.10 mg/dL   Calcium 9.3  8.4 - 10.5 mg/dL   GFR calc non Af Amer 40 (*) >90 mL/min   GFR calc Af Amer 47 (*) >90 mL/min   Laboratory interpretation all normal   Ct Abdomen Pelvis W Contrast  12/26/2012   *RADIOLOGY REPORT*  Clinical Data: Abdominal pain, bloating.  Previous cholecystectomy, appendectomy, hysterectomy.  CT ABDOMEN AND PELVIS WITH CONTRAST  Technique:  Multidetector CT imaging of the abdomen and pelvis was performed following the standard protocol during bolus administration of intravenous contrast.  Contrast: 11mL OMNIPAQUE IOHEXOL 300 MG/ML  SOLN, 109mL OMNIPAQUE IOHEXOL 300 MG/ML  SOLN  Comparison:   08/25/2010 and earlier studies  Findings: Visualized lung bases clear.  Transvenous pacing leads are partially seen.  Patchy coronary calcifications.  Small hiatal hernia.  Stable small  low attenuation liver lesions probably cysts. No new liver lesion.  Surgical clips in the gallbladder fossa. Unremarkable spleen, kidneys, adrenal glands. Normal bilateral excretion into decompressed collecting systems on delayed scans. Mild pancreatic parenchymal atrophy without focal lesion. Extensive aortoiliac arterial calcifications without aneurysm.  The stomach, small bowel, and colon are nondilated.  Scattered descending and sigmoid diverticula without adjacent  inflammatory/edematous change. Urinary bladder incompletely distended.  No ascites.  No free air.  Uterus absent.  No adenopathy localized.  Portal vein patent.  There is a thoracolumbar dextroscoliosis apex L1 with multilevel degenerative changes.  Mild superior endplate compression fracture deformity of T12, present on prior films of 08/25/2010.  No acute fracture.  IMPRESSION: 1.  No acute abdominal process. 2.  Descending and sigmoid diverticulosis. 3. Atherosclerosis, including aortoiliac and coronary artery disease. Please note that although the presence of coronary artery calcium documents the presence of coronary artery disease, the severity of this disease and any potential stenosis cannot be assessed on this non-gated CT examination.  Assessment for potential risk factor modification, dietary therapy or pharmacologic therapy may be warranted, if clinically indicated.   Original Report Authenticated By: D. Wallace Going, MD    1. Urinary frequency     New Prescriptions   CIPROFLOXACIN (CIPRO) 500 MG TABLET    Take 1 tablet (500 mg total) by mouth 2 (two) times daily.   PHENAZOPYRIDINE (PYRIDIUM) 200 MG TABLET    Take 1 tablet (200 mg total) by mouth 3 (three) times daily.    Rolland Porter, MD, FACEP   MDM   I personally performed the services described in this documentation, which was scribed in my presence. The recorded information has been reviewed and considered.  Rolland Porter, MD, FACEP   Janice Norrie, MD 12/26/12 [phone]

## 2012-12-26 NOTE — ED Notes (Signed)
Patient with no complaints at this time. Respirations even and unlabored. Skin warm/dry. Discharge instructions reviewed with patient at this time. Patient given opportunity to voice concerns/ask questions. IV removed per policy and band-aid applied to site. Patient discharged at this time and left Emergency Department with steady gait.  

## 2013-01-24 ENCOUNTER — Other Ambulatory Visit (INDEPENDENT_AMBULATORY_CARE_PROVIDER_SITE_OTHER): Payer: Medicare Other

## 2013-01-24 DIAGNOSIS — D62 Acute posthemorrhagic anemia: Secondary | ICD-10-CM

## 2013-01-25 LAB — CBC WITH DIFFERENTIAL
Basophils Absolute: 0 10*3/uL (ref 0.0–0.2)
Eos: 2 %
Eosinophils Absolute: 0.2 10*3/uL (ref 0.0–0.4)
Immature Grans (Abs): 0 10*3/uL (ref 0.0–0.1)
Immature Granulocytes: 0 %
Lymphocytes Absolute: 1.6 10*3/uL (ref 0.7–3.1)
MCH: 32.3 pg (ref 26.6–33.0)
MCHC: 34.2 g/dL (ref 31.5–35.7)
MCV: 95 fL (ref 79–97)
Monocytes Absolute: 0.7 10*3/uL (ref 0.1–0.9)
Neutrophils Relative %: 68 %
Platelets: 220 10*3/uL (ref 150–379)
RDW: 15.4 % (ref 12.3–15.4)

## 2013-01-25 LAB — BMP8+EGFR
BUN/Creatinine Ratio: 20 (ref 11–26)
CO2: 24 mmol/L (ref 18–29)
Creatinine, Ser: 1.31 mg/dL — ABNORMAL HIGH (ref 0.57–1.00)
Potassium: 4.4 mmol/L (ref 3.5–5.2)
Sodium: 140 mmol/L (ref 134–144)

## 2013-01-26 ENCOUNTER — Telehealth: Payer: Self-pay | Admitting: Pharmacist

## 2013-01-26 NOTE — Telephone Encounter (Signed)
[place] about recent lab results - recommended continue current medications and recheck BMET and CBC in 1 month.

## 2013-01-29 ENCOUNTER — Encounter: Payer: Self-pay | Admitting: *Deleted

## 2013-02-10 ENCOUNTER — Other Ambulatory Visit: Payer: Self-pay | Admitting: Family Medicine

## 2013-02-11 ENCOUNTER — Other Ambulatory Visit: Payer: Self-pay | Admitting: Nurse Practitioner

## 2013-02-11 MED ORDER — APIXABAN 2.5 MG PO TABS: 2.5000 mg | ORAL_TABLET | Freq: Two times a day (BID) | ORAL | Status: DC

## 2013-02-11 NOTE — Telephone Encounter (Signed)
Patient really needs to be seen- can see Davina Poke NP

## 2013-02-14 NOTE — Telephone Encounter (Signed)
Again NTBS - can be seen by B. Oxford

## 2013-02-16 ENCOUNTER — Telehealth: Payer: Self-pay | Admitting: Cardiovascular Disease

## 2013-02-16 ENCOUNTER — Telehealth: Payer: Self-pay | Admitting: Pharmacist

## 2013-02-16 NOTE — Telephone Encounter (Signed)
Notified of instruction by Dr. Loletha Grayer for stopping the apixaban prior to any procedure being done.  Voiced understanding.

## 2013-02-16 NOTE — Telephone Encounter (Signed)
Cystoscopy planned for  Tuesday, October 21st.   Patient is to hold eliquis starting Sunday October 19th in the morning.   She may restart the day after procedure as long as she is not having any signs of bleeding.   Patient is called with plan and she voices understanding over the phone.  Copy of plan has also been sent to Dr Rennie Plowman office attn Mardene Celeste (fax # [phone])  Cherre Robins, PharmD, CPP

## 2013-02-16 NOTE — Telephone Encounter (Signed)
Paper chart requested.

## 2013-02-16 NOTE — Telephone Encounter (Signed)
He wants to know if it is all right to stop her blood thinner and  How many days should she be off of it? She is having cysto on 02-22-13.

## 2013-02-16 NOTE — Telephone Encounter (Signed)
Pt scheduled for cystoscopy on 10.21.14.  Last OV was 8.29.14 w/ NP.  Message forwarded to Dr. Darlen Round, CMA.

## 2013-02-16 NOTE — Telephone Encounter (Signed)
Stop apixaban 24 hours before a minor surgical procedure (like cystoscopy), 48h before major procedures. No need for "bridging", resume as soon as it is safe per Urologist (this will depend on whether any biopsy/surgery is performed. Sanda Klein, MD

## 2013-02-17 ENCOUNTER — Telehealth: Payer: Self-pay | Admitting: Cardiovascular Disease

## 2013-02-17 ENCOUNTER — Encounter (HOSPITAL_COMMUNITY): Payer: Self-pay | Admitting: Pharmacy Technician

## 2013-02-18 ENCOUNTER — Encounter (HOSPITAL_COMMUNITY)
Admission: RE | Admit: 2013-02-18 | Discharge: 2013-02-18 | Disposition: A | Discharge status: Home / Self Care | Payer: Medicare Other | Source: Ambulatory Visit | Attending: Urology | Admitting: Urology

## 2013-02-18 ENCOUNTER — Encounter (HOSPITAL_COMMUNITY): Payer: Self-pay

## 2013-02-18 DIAGNOSIS — Z01812 Encounter for preprocedural laboratory examination: Secondary | ICD-10-CM | POA: Insufficient documentation

## 2013-02-18 HISTORY — DX: Unspecified glaucoma: H40.9

## 2013-02-18 LAB — BASIC METABOLIC PANEL
CO2: 28 mEq/L (ref 19–32)
Chloride: 97 mEq/L (ref 96–112)
Creatinine, Ser: 1.22 mg/dL — ABNORMAL HIGH (ref 0.50–1.10)
Glucose, Bld: 94 mg/dL (ref 70–99)
Sodium: 137 mEq/L (ref 135–145)

## 2013-02-18 LAB — HEMOGLOBIN AND HEMATOCRIT, BLOOD: HCT: 36.5 % (ref 36.0–46.0)

## 2013-02-18 NOTE — Patient Instructions (Addendum)
Kimberly [name]  02/18/2013   Your procedure is scheduled on:   02/24/2013  Report to [facility] at  59 AM.  Call this number if you have problems the morning of surgery: [phone]   Remember:   Do not eat food or drink liquids after midnight.   Take these medicines the morning of surgery with A SIP OF WATER: coreg,norco, prilosec. Stop your blood thinner and no aspirin or aspirin products before your surgery.   Do not wear jewelry, make-up or nail polish.  Do not wear lotions, powders, or perfumes.  Do not shave 48 hours prior to surgery. Men may shave face and neck.  Do not bring valuables to the hospital.  [facility] is not responsible  for any belongings or valuables.               Contacts, dentures or bridgework may not be worn into surgery.  Leave suitcase in the car. After surgery it may be brought to your room.  For patients admitted to the hospital, discharge time is determined by your treatment team.               Patients discharged the day of surgery will not be allowed to drive home.  Name and phone number of your driver: family  Special Instructions: Shower using CHG 2 nights before surgery and the night before surgery.  If you shower the day of surgery use CHG.  Use special wash - you have one bottle of CHG for all showers.  You should use approximately 1/3 of the bottle for each shower.   Please read over the following fact sheets that you were given: Pain Booklet, Coughing and Deep Breathing, Surgical Site Infection Prevention, Anesthesia Post-op Instructions and Care and Recovery After Surgery Cystoscopy Cystoscopy is a procedure that is used to help your caregiver diagnose and sometimes treat conditions that affect your lower urinary tract. Your lower urinary tract includes your bladder and the tube through which urine passes from your bladder out of your body (urethra). Cystoscopy is performed with a thin, tube-shaped instrument (cystoscope). The cystoscope  has lenses and a light at the end so that your caregiver can see inside your bladder. The cystoscope is inserted at the entrance of your urethra. Your caregiver guides it through your urethra and into your bladder. There are two main types of cystoscopy:  Flexible cystoscopy (with a flexible cystoscope).  Rigid cystoscopy (with a rigid cystoscope). Cystoscopy may be recommended for many conditions, including:  Urinary tract infections.  Blood in your urine (hematuria).  Loss of bladder control (urinary incontinence) or overactive bladder.  Unusual cells found in a urine sample.  Urinary blockage.  Painful urination. Cystoscopy may also be done to remove a sample of your tissue to be checked under a microscope (biopsy). It may also be done to remove or destroy bladder stones. LET YOUR CAREGIVER KNOW ABOUT:  Allergies to food or medicine.  Medicines taken, including vitamins, herbs, eyedrops, over-the-counter medicines, and creams.  Use of steroids (by mouth or creams).  Previous problems with anesthetics or numbing medicines.  History of bleeding problems or blood clots.  Previous surgery.  Other health problems, including diabetes and kidney problems.  Possibility of pregnancy, if this applies. PROCEDURE The area around the opening to your urethra will be cleaned. A medicine to numb your urethra (local anesthetic) is used. If a tissue sample or stone is removed during the procedure, you may be given  a medicine to make you sleep (general anesthetic). Your caregiver will gently insert the tip of the cystoscope into your urethra. The cystoscope will be slowly glided through your urethra and into your bladder. Sterile fluid will flow through the cystoscope and into your bladder. The fluid will expand and stretch your bladder. This gives your caregiver a better view of your bladder walls. The procedure lasts about 15 20 minutes. AFTER THE PROCEDURE If a local anesthetic is used,  you will be allowed to go home as soon as you are ready. If a general anesthetic is used, you will be taken to a recovery area until you are stable. You may have temporary bleeding and burning on urination. Document Released: 04/18/2000 Document Revised: 01/14/2012 Document Reviewed: 10/13/2011 [facility] Patient Information 2014 [place]. PATIENT INSTRUCTIONS POST-ANESTHESIA  IMMEDIATELY FOLLOWING SURGERY:  Do not drive or operate machinery for the first twenty four hours after surgery.  Do not make any important decisions for twenty four hours after surgery or while taking narcotic pain medications or sedatives.  If you develop intractable nausea and vomiting or a severe headache please notify your doctor immediately.  FOLLOW-UP:  Please make an appointment with your surgeon as instructed. You do not need to follow up with anesthesia unless specifically instructed to do so.  WOUND CARE INSTRUCTIONS (if applicable):  Keep a dry clean dressing on the anesthesia/puncture wound site if there is drainage.  Once the wound has quit draining you may leave it open to air.  Generally you should leave the bandage intact for twenty four hours unless there is drainage.  If the epidural site drains for more than 36-48 hours please call the anesthesia department.  QUESTIONS?:  Please feel free to call your physician or the hospital operator if you have any questions, and they will be happy to assist you.

## 2013-02-23 NOTE — OR Nursing (Signed)
Called office spoke with [facility] to remind Dr. Michela Pitcher that we need his H&P for this surgery tomarrow

## 2013-02-23 NOTE — Pre-Procedure Instructions (Signed)
Patient instructed on 02/18/13 to stop eliquis and aspirin or nsaids in preparation for surgery 02/23/2013. Patient vebalized understanding.

## 2013-02-24 ENCOUNTER — Ambulatory Visit (HOSPITAL_COMMUNITY)
Admission: RE | Admit: 2013-02-24 | Discharge: 2013-02-24 | Disposition: A | Discharge status: Home Health | Payer: Medicare Other | Source: Ambulatory Visit | Attending: Urology | Admitting: Urology

## 2013-02-24 ENCOUNTER — Encounter (HOSPITAL_COMMUNITY): Payer: Medicare Other | Admitting: Anesthesiology

## 2013-02-24 ENCOUNTER — Encounter (HOSPITAL_COMMUNITY): Payer: Self-pay | Admitting: *Deleted

## 2013-02-24 ENCOUNTER — Ambulatory Visit (HOSPITAL_COMMUNITY): Payer: Medicare Other | Admitting: Anesthesiology

## 2013-02-24 ENCOUNTER — Encounter (HOSPITAL_COMMUNITY)
Admission: RE | Disposition: A | Discharge status: Home Health | Payer: Self-pay | Source: Ambulatory Visit | Attending: Urology

## 2013-02-24 DIAGNOSIS — I10 Essential (primary) hypertension: Secondary | ICD-10-CM | POA: Insufficient documentation

## 2013-02-24 DIAGNOSIS — N289 Disorder of kidney and ureter, unspecified: Secondary | ICD-10-CM | POA: Insufficient documentation

## 2013-02-24 DIAGNOSIS — N35919 Unspecified urethral stricture, male, unspecified site: Secondary | ICD-10-CM | POA: Insufficient documentation

## 2013-02-24 DIAGNOSIS — R350 Frequency of micturition: Secondary | ICD-10-CM | POA: Insufficient documentation

## 2013-02-24 DIAGNOSIS — F3289 Other specified depressive episodes: Secondary | ICD-10-CM | POA: Insufficient documentation

## 2013-02-24 DIAGNOSIS — I4891 Unspecified atrial fibrillation: Secondary | ICD-10-CM | POA: Insufficient documentation

## 2013-02-24 DIAGNOSIS — M4120 Other idiopathic scoliosis, site unspecified: Secondary | ICD-10-CM | POA: Insufficient documentation

## 2013-02-24 DIAGNOSIS — Z950 Presence of cardiac pacemaker: Secondary | ICD-10-CM | POA: Insufficient documentation

## 2013-02-24 DIAGNOSIS — IMO0001 Reserved for inherently not codable concepts without codable children: Secondary | ICD-10-CM | POA: Insufficient documentation

## 2013-02-24 DIAGNOSIS — F329 Major depressive disorder, single episode, unspecified: Secondary | ICD-10-CM | POA: Insufficient documentation

## 2013-02-24 HISTORY — PX: CYSTOSCOPY: SHX5120

## 2013-02-24 SURGERY — CYSTOSCOPY
Anesthesia: Monitor Anesthesia Care | Site: Bladder | Wound class: Clean Contaminated

## 2013-02-24 MED ORDER — FENTANYL CITRATE 0.05 MG/ML IJ SOLN
INTRAMUSCULAR | Status: AC
  Filled 2013-02-24: qty 2

## 2013-02-24 MED ORDER — ONDANSETRON HCL 4 MG/2ML IJ SOLN
4.0000 mg | Freq: Once | INTRAMUSCULAR | Status: AC
  Administered 2013-02-24: 4 mg via INTRAVENOUS

## 2013-02-24 MED ORDER — PROPOFOL 10 MG/ML IV EMUL
INTRAVENOUS | Status: AC
  Filled 2013-02-24: qty 20

## 2013-02-24 MED ORDER — LIDOCAINE VISCOUS 2 % MT SOLN
OROMUCOSAL | Status: AC
  Filled 2013-02-24: qty 15

## 2013-02-24 MED ORDER — LIDOCAINE HCL (CARDIAC) 10 MG/ML IV SOLN
INTRAVENOUS | Status: DC | PRN
  Administered 2013-02-24: 20 mg via INTRAVENOUS

## 2013-02-24 MED ORDER — LIDOCAINE VISCOUS 2 % MT SOLN
OROMUCOSAL | Status: DC | PRN
  Administered 2013-02-24: 1 via OROMUCOSAL

## 2013-02-24 MED ORDER — MIDAZOLAM HCL 2 MG/2ML IJ SOLN
INTRAMUSCULAR | Status: AC
  Filled 2013-02-24: qty 2

## 2013-02-24 MED ORDER — FENTANYL CITRATE 0.05 MG/ML IJ SOLN
25.0000 ug | INTRAMUSCULAR | Status: AC
  Administered 2013-02-24 (×2): 25 ug via INTRAVENOUS

## 2013-02-24 MED ORDER — PROPOFOL INFUSION 10 MG/ML OPTIME
INTRAVENOUS | Status: DC | PRN
  Administered 2013-02-24: 125 ug/kg/min via INTRAVENOUS

## 2013-02-24 MED ORDER — FENTANYL CITRATE 0.05 MG/ML IJ SOLN
INTRAMUSCULAR | Status: DC | PRN
  Administered 2013-02-24: 50 ug via INTRAVENOUS

## 2013-02-24 MED ORDER — LACTATED RINGERS IV SOLN
INTRAVENOUS | Status: DC
Start: 2013-02-24 — End: 2013-02-24
  Administered 2013-02-24: 09:00:00 via INTRAVENOUS

## 2013-02-24 MED ORDER — STERILE WATER FOR IRRIGATION IR SOLN
Status: DC | PRN
  Administered 2013-02-24: 1000 mL

## 2013-02-24 MED ORDER — PROPOFOL 10 MG/ML IV BOLUS
INTRAVENOUS | Status: DC | PRN
  Administered 2013-02-24 (×2): 6.7 mg via INTRAVENOUS

## 2013-02-24 MED ORDER — LIDOCAINE HCL (PF) 1 % IJ SOLN
INTRAMUSCULAR | Status: AC
  Filled 2013-02-24: qty 5

## 2013-02-24 MED ORDER — ONDANSETRON HCL 4 MG/2ML IJ SOLN
INTRAMUSCULAR | Status: AC
  Filled 2013-02-24: qty 2

## 2013-02-24 MED ORDER — MIDAZOLAM HCL 2 MG/2ML IJ SOLN
1.0000 mg | INTRAMUSCULAR | Status: DC | PRN
  Administered 2013-02-24: 2 mg via INTRAVENOUS

## 2013-02-24 MED ORDER — MIDAZOLAM HCL 2 MG/2ML IJ SOLN
INTRAMUSCULAR | Status: DC | PRN
  Administered 2013-02-24 (×2): 0.5 mg via INTRAVENOUS
  Administered 2013-02-24: 1 mg via INTRAVENOUS

## 2013-02-24 MED ORDER — SODIUM CHLORIDE 0.9 % IR SOLN
Status: DC | PRN
  Administered 2013-02-24: 3000 mL

## 2013-02-24 SURGICAL SUPPLY — 20 items
BAG DRAIN URO TABLE W/ADPT NS (DRAPE) ×2 IMPLANT
BAG DRN 8 ADPR NS SKTRN CSTL (DRAPE) ×1
BAG HAMPER (MISCELLANEOUS) ×2 IMPLANT
CATH FOLEY 2WAY SLVR  5CC 18FR (CATHETERS)
CATH FOLEY 2WAY SLVR 5CC 18FR (CATHETERS) IMPLANT
CLOTH BEACON ORANGE TIMEOUT ST (SAFETY) ×2 IMPLANT
GLOVE BIO SURGEON STRL SZ7 (GLOVE) ×2 IMPLANT
GLOVE BIOGEL PI IND STRL 7.5 (GLOVE) IMPLANT
GLOVE BIOGEL PI INDICATOR 7.5 (GLOVE) ×1
GLOVE ECLIPSE 7.0 STRL STRAW (GLOVE) ×2 IMPLANT
GOWN STRL REIN XL XLG (GOWN DISPOSABLE) ×2 IMPLANT
IV NS IRRIG 3000ML ARTHROMATIC (IV SOLUTION) ×2 IMPLANT
KIT ROOM TURNOVER AP CYSTO (KITS) ×2 IMPLANT
MANIFOLD NEPTUNE II (INSTRUMENTS) ×2 IMPLANT
PACK CYSTO (CUSTOM PROCEDURE TRAY) ×2 IMPLANT
PAD ARMBOARD 7.5X6 YLW CONV (MISCELLANEOUS) ×2 IMPLANT
SET IRRIGATING DISP (SET/KITS/TRAYS/PACK) ×2 IMPLANT
SYRINGE IRR TOOMEY STRL 70CC (SYRINGE) IMPLANT
TOWEL OR 17X26 4PK STRL BLUE (TOWEL DISPOSABLE) ×2 IMPLANT
WATER STERILE IRR 1000ML POUR (IV SOLUTION) ×2 IMPLANT

## 2013-02-24 NOTE — Op Note (Signed)
Op note dictation [phone]

## 2013-02-24 NOTE — Anesthesia Postprocedure Evaluation (Signed)
Anesthesia Post Note  Patient: Kimberly [name]  Procedure(s) Performed: Procedure(s) (LRB): CYSTOSCOPY WITH URETHRAL DILITATION (N/A)  Anesthesia type: MAC  Patient location: PACU  Post pain: Pain level controlled  Post assessment: Post-op Vital signs reviewed, Patient's Cardiovascular Status Stable, Respiratory Function Stable, Patent Airway, No signs of Nausea or vomiting and Pain level controlled  Last Vitals:  Filed Vitals:   02/24/13 1027  BP: 78/46  Temp: 36.5 C  Resp: 14    Post vital signs: Reviewed and stable  Level of consciousness: awake and alert   Complications: No apparent anesthesia complications

## 2013-02-24 NOTE — Anesthesia Preprocedure Evaluation (Signed)
Anesthesia Evaluation  Patient identified by MRN, date of birth, ID band Patient awake    Reviewed: Allergy & Precautions, H&P , NPO status , Patient's Chart, lab work & pertinent test results, reviewed documented beta blocker date and time   Airway Mallampati: III TM Distance: <3 FB Neck ROM: Full    Dental  (+) Teeth Intact   Pulmonary  breath sounds clear to auscultation        Cardiovascular hypertension, + dysrhythmias Atrial Fibrillation + pacemaker Rhythm:Irregular Rate:Normal     Neuro/Psych PSYCHIATRIC DISORDERS Depression    GI/Hepatic   Endo/Other    Renal/GU Renal disease     Musculoskeletal  (+) Fibromyalgia -  Abdominal   Peds  Hematology   Anesthesia Other Findings   Reproductive/Obstetrics                           Anesthesia Physical Anesthesia Plan  ASA: III  Anesthesia Plan: MAC   Post-op Pain Management:    Induction: Intravenous  Airway Management Planned: Simple Face Mask  Additional Equipment:   Intra-op Plan:   Post-operative Plan:   Informed Consent: I have reviewed the patients History and Physical, chart, labs and discussed the procedure including the risks, benefits and alternatives for the proposed anesthesia with the patient or authorized representative who has indicated his/her understanding and acceptance.     Plan Discussed with:   Anesthesia Plan Comments: (Possible Gen-LMA if required.)        Anesthesia Quick Evaluation

## 2013-02-24 NOTE — Progress Notes (Signed)
No change in H&P on repeat examination. 

## 2013-02-24 NOTE — Transfer of Care (Signed)
Immediate Anesthesia Transfer of Care Note  Patient: Kimberly [name]  Procedure(s) Performed: Procedure(s) (LRB): CYSTOSCOPY WITH URETHRAL DILITATION (N/A)  Patient Location: PACU  Anesthesia Type: MAC  Level of Consciousness: awake  Airway & Oxygen Therapy: Patient Spontanous Breathing.   Post-op Assessment: Report given to PACU RN, Post -op Vital signs reviewed and stable and Patient moving all extremities  Post vital signs: Reviewed and stable  Complications: No apparent anesthesia complications

## 2013-02-24 NOTE — Anesthesia Procedure Notes (Signed)
Procedure Name: MAC Date/Time: 02/24/2013 9:55 AM Performed by: Vista Deck Pre-anesthesia Checklist: Patient identified, Emergency Drugs available, Suction available, Timeout performed and Patient being monitored Patient Re-evaluated:Patient Re-evaluated prior to inductionOxygen Delivery Method: Non-rebreather mask

## 2013-02-24 NOTE — Op Note (Signed)
NAME:  Kimberly [name], Kimberly [name]                      ACCOUNT NO.:  MEDICAL RECORD NO.:  [id number]  LOCATION:                                 FACILITY:  PHYSICIAN:  Marissa Nestle, M.D.DATE OF BIRTH:  July 12, 1926  DATE OF PROCEDURE: DATE OF DISCHARGE:                              OPERATIVE REPORT   PREOPERATIVE DIAGNOSIS:  Urinary frequency.  POSTOPERATIVE DIAGNOSIS:  Questionable interstitial cystitis, urethral stenosis.  PROCEDURE:  Cystodilation.  DESCRIPTION OF PROCEDURE:  Patient placed in lithotomy position.  After usual prep and drape, #25 cystoscope introduced into the bladder.  It was inspected.  There is questionable distribution of flow of blood vessels and the mucosa like interstitial cystitis.  No gross tumor, stone, or foreign body is seen.  Bladder was removed.  A 16-French cystoscope was removed.  The urethra was dilated to 32-French.  The patient left the operating room in satisfactory condition.     Marissa Nestle, M.D.     MIJ/MEDQ  D:  02/24/2013  T:  02/24/2013  Job:  [id number]

## 2013-02-24 NOTE — H&P (Signed)
NAME:  Kimberly [name], Kimberly [name]                 ACCOUNT NO.:  [id number]  MEDICAL RECORD NO.:  [id number]  LOCATION:                                 FACILITY:  PHYSICIAN:  Marissa Nestle, M.D.DATE OF BIRTH:  08-03-1926  DATE OF ADMISSION:  02/24/2013 DATE OF DISCHARGE:  LH                             HISTORY & PHYSICAL   CHIEF COMPLAINT:  Urinary tract infection.  HISTORY:  An 79 year old female went to the emergency room at [facility] in August, was treated for urinary tract infection with Cipro and Pyridium.  She said it did not work, so she quit taking it.  No fever, chills, or gross hematuria.  She was evaluated by me.  I recommended that she undergo a cystoscopy.  She want to get it done in the hospital as outpatient.  She is coming as outpatient.  We will do cysto under local anesthesia tomorrow.  PAST MEDICAL HISTORY:  Include history of hysterectomy several years ago for fibroid tumor, cholecystectomy, ruptured appendix.  Also says that she has difficulty walking because she has scoliosis, but also thinks that the problem is getting worse.  She has a history of having positive urine culture.  She says usually the antibiotics works, but this time it did not.  PHYSICAL EXAMINATION:  GENERAL:  Moderately built female, not in acute distress, fully conscious, alert, oriented. VITAL SIGNS:  Blood pressure is 123/73, temperature 98. CENTRAL NERVOUS SYSTEM:  No gross neurological deficit. HEAD, NECK, EYE, ENT:  Negative. CHEST:  Symmetrical. HEART:  Regular sinus rhythm.  No murmur. ABDOMEN:  Soft, flat.  Liver, spleen, and kidneys are not palpable. PELVIC:  No adnexal mass or tenderness.  IMPRESSION:  Recurrent urinary tract infection.  PLAN:  Cystoscopy under local anesthesia as outpatient tomorrow.     Marissa Nestle, M.D.    MIJ/MEDQ  D:  02/23/2013  T:  02/24/2013  Job:  [id number]

## 2013-02-28 ENCOUNTER — Other Ambulatory Visit (INDEPENDENT_AMBULATORY_CARE_PROVIDER_SITE_OTHER): Payer: Medicare Other

## 2013-02-28 DIAGNOSIS — D649 Anemia, unspecified: Secondary | ICD-10-CM

## 2013-02-28 NOTE — Progress Notes (Signed)
Patient had labs dropped off by [facility] Nurse

## 2013-03-01 ENCOUNTER — Encounter (HOSPITAL_COMMUNITY): Payer: Self-pay | Admitting: Urology

## 2013-03-01 LAB — CBC WITH DIFFERENTIAL
Basophils Absolute: 0 10*3/uL (ref 0.0–0.2)
Basos: 0 %
Eosinophils Absolute: 0.1 10*3/uL (ref 0.0–0.4)
HCT: 36.3 % (ref 34.0–46.6)
Hemoglobin: 12.1 g/dL (ref 11.1–15.9)
Lymphocytes Absolute: 1.8 10*3/uL (ref 0.7–3.1)
Lymphs: 19 %
MCH: 32.1 pg (ref 26.6–33.0)
MCHC: 33.3 g/dL (ref 31.5–35.7)
Neutrophils Absolute: 6.8 10*3/uL (ref 1.4–7.0)

## 2013-03-07 ENCOUNTER — Ambulatory Visit (INDEPENDENT_AMBULATORY_CARE_PROVIDER_SITE_OTHER): Payer: Medicare Other | Admitting: *Deleted

## 2013-03-07 DIAGNOSIS — Z23 Encounter for immunization: Secondary | ICD-10-CM

## 2013-05-24 ENCOUNTER — Other Ambulatory Visit: Payer: Self-pay | Admitting: Cardiovascular Disease

## 2013-05-30 ENCOUNTER — Other Ambulatory Visit: Payer: Medicare Other

## 2013-05-30 NOTE — Progress Notes (Signed)
Home health brought this in

## 2013-05-31 LAB — CBC WITH DIFFERENTIAL
Basophils Absolute: 0 10*3/uL (ref 0.0–0.2)
Basos: 0 %
Eos: 2 %
Eosinophils Absolute: 0.1 10*3/uL (ref 0.0–0.4)
HCT: 34.5 % (ref 34.0–46.6)
Hemoglobin: 11.5 g/dL (ref 11.1–15.9)
Immature Grans (Abs): 0 10*3/uL (ref 0.0–0.1)
Immature Granulocytes: 0 %
Lymphocytes Absolute: 1.9 10*3/uL (ref 0.7–3.1)
Lymphs: 28 %
MCH: 31.9 pg (ref 26.6–33.0)
MCHC: 33.3 g/dL (ref 31.5–35.7)
MCV: 96 fL (ref 79–97)
Monocytes Absolute: 0.6 10*3/uL (ref 0.1–0.9)
Monocytes: 8 %
Neutrophils Absolute: 4.2 10*3/uL (ref 1.4–7.0)
Neutrophils Relative %: 62 %
Platelets: 236 10*3/uL (ref 150–379)
RBC: 3.61 x10E6/uL — ABNORMAL LOW (ref 3.77–5.28)
RDW: 13.5 % (ref 12.3–15.4)
WBC: 6.9 10*3/uL (ref 3.4–10.8)

## 2013-05-31 LAB — CMP14+EGFR
ALT: 6 IU/L (ref 0–32)
AST: 13 IU/L (ref 0–40)
Albumin/Globulin Ratio: 1.5 (ref 1.1–2.5)
Albumin: 3.9 g/dL (ref 3.5–4.7)
Alkaline Phosphatase: 87 IU/L (ref 39–117)
BUN/Creatinine Ratio: 14 (ref 11–26)
BUN: 17 mg/dL (ref 8–27)
CO2: 24 mmol/L (ref 18–29)
Calcium: 9 mg/dL (ref 8.7–10.3)
Chloride: 98 mmol/L (ref 97–108)
Creatinine, Ser: 1.2 mg/dL — ABNORMAL HIGH (ref 0.57–1.00)
GFR calc Af Amer: 48 mL/min/{1.73_m2} — ABNORMAL LOW (ref >59)
GFR calc non Af Amer: 42 mL/min/{1.73_m2} — ABNORMAL LOW (ref >59)
Globulin, Total: 2.6 g/dL (ref 1.5–4.5)
Glucose: 86 mg/dL (ref 65–99)
Potassium: 3.9 mmol/L (ref 3.5–5.2)
Sodium: 141 mmol/L (ref 134–144)
Total Bilirubin: 0.2 mg/dL (ref 0.0–1.2)
Total Protein: 6.5 g/dL (ref 6.0–8.5)

## 2013-06-11 ENCOUNTER — Other Ambulatory Visit: Payer: Self-pay | Admitting: Cardiovascular Disease

## 2013-06-11 ENCOUNTER — Other Ambulatory Visit: Payer: Self-pay | Admitting: Nurse Practitioner

## 2013-06-15 ENCOUNTER — Telehealth: Payer: Self-pay | Admitting: Family Medicine

## 2013-06-15 MED ORDER — DOXEPIN HCL 3 MG PO TABS: 1.0000 | ORAL_TABLET | Freq: Every evening | ORAL | Status: DC | PRN

## 2013-06-15 NOTE — Telephone Encounter (Signed)
Bradd Canary calls to request samples of any sleep medication for Mrs. Binkerd to try. Left #12 samples of Silenor 3mg  for Bradd Canary to pick up at the front desk for Kimberly [name] to try.

## 2013-07-11 ENCOUNTER — Other Ambulatory Visit: Payer: Self-pay | Admitting: Cardiovascular Disease

## 2013-07-11 NOTE — Telephone Encounter (Signed)
Rx was sent to pharmacy electronically. 

## 2013-08-02 ENCOUNTER — Ambulatory Visit (INDEPENDENT_AMBULATORY_CARE_PROVIDER_SITE_OTHER): Payer: Medicare Other | Admitting: *Deleted

## 2013-08-02 DIAGNOSIS — I442 Atrioventricular block, complete: Secondary | ICD-10-CM

## 2013-08-02 DIAGNOSIS — I4891 Unspecified atrial fibrillation: Secondary | ICD-10-CM

## 2013-08-02 DIAGNOSIS — I509 Heart failure, unspecified: Secondary | ICD-10-CM

## 2013-08-02 LAB — PACEMAKER DEVICE OBSERVATION

## 2013-08-02 MED ORDER — CARVEDILOL 6.25 MG PO TABS: 6.2500 mg | ORAL_TABLET | Freq: Two times a day (BID) | ORAL | Status: DC

## 2013-08-02 MED ORDER — FUROSEMIDE 20 MG PO TABS: 40.0000 mg | ORAL_TABLET | Freq: Every day | ORAL | Status: DC

## 2013-08-05 LAB — MDC_IDC_ENUM_SESS_TYPE_INCLINIC
Battery Voltage: 3 V
Brady Statistic AP VP Percent: 0 %
Brady Statistic AP VS Percent: 0 %
Brady Statistic AS VP Percent: 99.5 %
Lead Channel Impedance Value: 760 Ohm
Lead Channel Pacing Threshold Amplitude: 1 V
Lead Channel Pacing Threshold Amplitude: 1 V
Lead Channel Pacing Threshold Pulse Width: 1 ms
Lead Channel Setting Pacing Amplitude: 2 V
Lead Channel Setting Pacing Amplitude: 2 V
Lead Channel Setting Pacing Pulse Width: 0.4 ms
Lead Channel Setting Pacing Pulse Width: 1 ms
Lead Channel Setting Sensing Sensitivity: 0.3 mV
MDC IDC MSMT LEADCHNL LV IMPEDANCE VALUE: 741 Ohm
MDC IDC MSMT LEADCHNL RV PACING THRESHOLD PULSEWIDTH: 0.4 ms
MDC IDC SET ZONE DETECTION INTERVAL: 300 ms
MDC IDC SET ZONE DETECTION INTERVAL: 350 ms
MDC IDC SET ZONE DETECTION INTERVAL: 370 ms
MDC IDC STAT BRADY AS VS PERCENT: 0.5 %
Zone Setting Detection Interval: 450 ms

## 2013-08-05 NOTE — Progress Notes (Signed)
CRT-P device check in clinic (industry checked). Normal device function. Thresholds and impedance consistent with previous measurements. Histograms appropriate for patient and level of activity. Permanent AF + Eliquis. 1 ventricular high rate episode x 11 bts @ 163bpm. Patient bi-ventricularly pacing 100% of the time. Device programmed with appropriate safety margins. Abnormal thoracic impedance since 3-19. Estimated longevity 5 years. Patient will follow up with [facility] on 5-11.

## 2013-08-24 ENCOUNTER — Encounter (HOSPITAL_COMMUNITY): Payer: Self-pay | Admitting: Emergency Medicine

## 2013-08-24 ENCOUNTER — Inpatient Hospital Stay (HOSPITAL_COMMUNITY)
Admission: EM | Admit: 2013-08-24 | Discharge: 2013-08-28 | DRG: 392 | Disposition: A | Discharge status: Home / Self Care | Payer: Medicare Other | Attending: Internal Medicine | Admitting: Internal Medicine

## 2013-08-24 ENCOUNTER — Emergency Department (HOSPITAL_COMMUNITY): Payer: Medicare Other

## 2013-08-24 DIAGNOSIS — K5792 Diverticulitis of intestine, part unspecified, without perforation or abscess without bleeding: Secondary | ICD-10-CM

## 2013-08-24 DIAGNOSIS — N183 Chronic kidney disease, stage 3 unspecified: Secondary | ICD-10-CM | POA: Diagnosis present

## 2013-08-24 DIAGNOSIS — K5732 Diverticulitis of large intestine without perforation or abscess without bleeding: Principal | ICD-10-CM | POA: Diagnosis present

## 2013-08-24 DIAGNOSIS — Z87891 Personal history of nicotine dependence: Secondary | ICD-10-CM

## 2013-08-24 DIAGNOSIS — Z7901 Long term (current) use of anticoagulants: Secondary | ICD-10-CM

## 2013-08-24 DIAGNOSIS — K5900 Constipation, unspecified: Secondary | ICD-10-CM | POA: Diagnosis present

## 2013-08-24 DIAGNOSIS — F3289 Other specified depressive episodes: Secondary | ICD-10-CM | POA: Diagnosis present

## 2013-08-24 DIAGNOSIS — I4891 Unspecified atrial fibrillation: Secondary | ICD-10-CM | POA: Diagnosis present

## 2013-08-24 DIAGNOSIS — D509 Iron deficiency anemia, unspecified: Secondary | ICD-10-CM | POA: Diagnosis present

## 2013-08-24 DIAGNOSIS — M797 Fibromyalgia: Secondary | ICD-10-CM | POA: Diagnosis present

## 2013-08-24 DIAGNOSIS — Z8249 Family history of ischemic heart disease and other diseases of the circulatory system: Secondary | ICD-10-CM

## 2013-08-24 DIAGNOSIS — F329 Major depressive disorder, single episode, unspecified: Secondary | ICD-10-CM | POA: Diagnosis present

## 2013-08-24 DIAGNOSIS — M4120 Other idiopathic scoliosis, site unspecified: Secondary | ICD-10-CM | POA: Diagnosis present

## 2013-08-24 DIAGNOSIS — I129 Hypertensive chronic kidney disease with stage 1 through stage 4 chronic kidney disease, or unspecified chronic kidney disease: Secondary | ICD-10-CM | POA: Diagnosis present

## 2013-08-24 DIAGNOSIS — Z825 Family history of asthma and other chronic lower respiratory diseases: Secondary | ICD-10-CM

## 2013-08-24 DIAGNOSIS — I10 Essential (primary) hypertension: Secondary | ICD-10-CM

## 2013-08-24 DIAGNOSIS — D649 Anemia, unspecified: Secondary | ICD-10-CM | POA: Diagnosis present

## 2013-08-24 DIAGNOSIS — I4821 Permanent atrial fibrillation: Secondary | ICD-10-CM | POA: Diagnosis present

## 2013-08-24 DIAGNOSIS — D72829 Elevated white blood cell count, unspecified: Secondary | ICD-10-CM | POA: Diagnosis present

## 2013-08-24 DIAGNOSIS — M1990 Unspecified osteoarthritis, unspecified site: Secondary | ICD-10-CM | POA: Diagnosis present

## 2013-08-24 DIAGNOSIS — I5032 Chronic diastolic (congestive) heart failure: Secondary | ICD-10-CM | POA: Diagnosis present

## 2013-08-24 DIAGNOSIS — IMO0001 Reserved for inherently not codable concepts without codable children: Secondary | ICD-10-CM | POA: Diagnosis present

## 2013-08-24 DIAGNOSIS — Z950 Presence of cardiac pacemaker: Secondary | ICD-10-CM

## 2013-08-24 DIAGNOSIS — H409 Unspecified glaucoma: Secondary | ICD-10-CM | POA: Diagnosis present

## 2013-08-24 HISTORY — DX: Diverticulitis of intestine, part unspecified, without perforation or abscess without bleeding: K57.92

## 2013-08-24 LAB — CBC WITH DIFFERENTIAL/PLATELET
Basophils Absolute: 0 10*3/uL (ref 0.0–0.1)
Basophils Relative: 0 % (ref 0–1)
EOS ABS: 0.1 10*3/uL (ref 0.0–0.7)
Eosinophils Relative: 1 % (ref 0–5)
HCT: 35.4 % — ABNORMAL LOW (ref 36.0–46.0)
Hemoglobin: 11.6 g/dL — ABNORMAL LOW (ref 12.0–15.0)
Lymphocytes Relative: 9 % — ABNORMAL LOW (ref 12–46)
Lymphs Abs: 1.2 10*3/uL (ref 0.7–4.0)
MCH: 30.7 pg (ref 26.0–34.0)
MCHC: 32.8 g/dL (ref 30.0–36.0)
MCV: 93.7 fL (ref 78.0–100.0)
MONOS PCT: 4 % (ref 3–12)
Monocytes Absolute: 0.5 10*3/uL (ref 0.1–1.0)
Neutro Abs: 11 10*3/uL — ABNORMAL HIGH (ref 1.7–7.7)
Neutrophils Relative %: 86 % — ABNORMAL HIGH (ref 43–77)
Platelets: 231 10*3/uL (ref 150–400)
RBC: 3.78 MIL/uL — AB (ref 3.87–5.11)
RDW: 14.5 % (ref 11.5–15.5)
WBC: 12.8 10*3/uL — ABNORMAL HIGH (ref 4.0–10.5)

## 2013-08-24 LAB — COMPREHENSIVE METABOLIC PANEL
ALK PHOS: 88 U/L (ref 39–117)
ALT: 7 U/L (ref 0–35)
AST: 15 U/L (ref 0–37)
Albumin: 3.5 g/dL (ref 3.5–5.2)
BUN: 21 mg/dL (ref 6–23)
CO2: 28 meq/L (ref 19–32)
Calcium: 9.2 mg/dL (ref 8.4–10.5)
Chloride: 98 mEq/L (ref 96–112)
Creatinine, Ser: 1.33 mg/dL — ABNORMAL HIGH (ref 0.50–1.10)
GFR, EST AFRICAN AMERICAN: 41 mL/min — AB (ref >90)
GFR, EST NON AFRICAN AMERICAN: 36 mL/min — AB (ref >90)
GLUCOSE: 112 mg/dL — AB (ref 70–99)
POTASSIUM: 3.2 meq/L — AB (ref 3.7–5.3)
Sodium: 140 mEq/L (ref 137–147)
TOTAL PROTEIN: 7.4 g/dL (ref 6.0–8.3)
Total Bilirubin: 0.3 mg/dL (ref 0.3–1.2)

## 2013-08-24 LAB — URINALYSIS, ROUTINE W REFLEX MICROSCOPIC
Bilirubin Urine: NEGATIVE
Glucose, UA: NEGATIVE mg/dL
Hgb urine dipstick: NEGATIVE
KETONES UR: NEGATIVE mg/dL
NITRITE: NEGATIVE
Protein, ur: NEGATIVE mg/dL
SPECIFIC GRAVITY, URINE: 1.01 (ref 1.005–1.030)
UROBILINOGEN UA: 0.2 mg/dL (ref 0.0–1.0)
pH: 5.5 (ref 5.0–8.0)

## 2013-08-24 LAB — LIPASE, BLOOD: LIPASE: 29 U/L (ref 11–59)

## 2013-08-24 LAB — URINE MICROSCOPIC-ADD ON

## 2013-08-24 LAB — PROTIME-INR
INR: 1.22 (ref 0.00–1.49)
Prothrombin Time: 15.1 seconds (ref 11.6–15.2)

## 2013-08-24 MED ORDER — ACETAMINOPHEN 650 MG RE SUPP: 650.0000 mg | Freq: Four times a day (QID) | RECTAL | Status: DC | PRN

## 2013-08-24 MED ORDER — CARVEDILOL 3.125 MG PO TABS
6.2500 mg | ORAL_TABLET | Freq: Two times a day (BID) | ORAL | Status: DC
  Administered 2013-08-25 – 2013-08-27 (×5): 6.25 mg via ORAL
  Filled 2013-08-24 (×5): qty 2

## 2013-08-24 MED ORDER — ONDANSETRON HCL 4 MG/2ML IJ SOLN: 4.0000 mg | Freq: Three times a day (TID) | INTRAMUSCULAR | Status: DC | PRN

## 2013-08-24 MED ORDER — HYDROMORPHONE HCL PF 1 MG/ML IJ SOLN: 0.5000 mg | INTRAMUSCULAR | Status: DC | PRN

## 2013-08-24 MED ORDER — ONDANSETRON HCL 4 MG/2ML IJ SOLN
4.0000 mg | Freq: Once | INTRAMUSCULAR | Status: AC
  Administered 2013-08-24: 4 mg via INTRAVENOUS
  Filled 2013-08-24: qty 2

## 2013-08-24 MED ORDER — ACETAMINOPHEN 325 MG PO TABS: 650.0000 mg | ORAL_TABLET | Freq: Four times a day (QID) | ORAL | Status: DC | PRN

## 2013-08-24 MED ORDER — CIPROFLOXACIN IN D5W 400 MG/200ML IV SOLN
400.0000 mg | Freq: Once | INTRAVENOUS | Status: AC
  Administered 2013-08-24: 400 mg via INTRAVENOUS
  Filled 2013-08-24: qty 200

## 2013-08-24 MED ORDER — HYDROMORPHONE HCL PF 1 MG/ML IJ SOLN
0.5000 mg | INTRAMUSCULAR | Status: DC | PRN
  Administered 2013-08-24 – 2013-08-26 (×10): 0.5 mg via INTRAVENOUS
  Filled 2013-08-24 (×11): qty 1

## 2013-08-24 MED ORDER — MORPHINE SULFATE 4 MG/ML IJ SOLN
4.0000 mg | Freq: Once | INTRAMUSCULAR | Status: AC
  Administered 2013-08-24: 4 mg via INTRAVENOUS
  Filled 2013-08-24: qty 1

## 2013-08-24 MED ORDER — SODIUM CHLORIDE 0.9 % IV SOLN
INTRAVENOUS | Status: DC
  Administered 2013-08-24: 20:00:00 via INTRAVENOUS

## 2013-08-24 MED ORDER — APIXABAN 5 MG PO TABS
2.5000 mg | ORAL_TABLET | Freq: Two times a day (BID) | ORAL | Status: DC
  Administered 2013-08-25 – 2013-08-28 (×7): 2.5 mg via ORAL
  Filled 2013-08-24 (×8): qty 1

## 2013-08-24 MED ORDER — METRONIDAZOLE IN NACL 5-0.79 MG/ML-% IV SOLN
500.0000 mg | Freq: Three times a day (TID) | INTRAVENOUS | Status: DC
  Administered 2013-08-25 – 2013-08-28 (×11): 500 mg via INTRAVENOUS
  Filled 2013-08-24 (×11): qty 100

## 2013-08-24 MED ORDER — ONDANSETRON HCL 4 MG PO TABS: 4.0000 mg | ORAL_TABLET | Freq: Four times a day (QID) | ORAL | Status: DC | PRN

## 2013-08-24 MED ORDER — ALBUTEROL SULFATE (2.5 MG/3ML) 0.083% IN NEBU: 2.5000 mg | INHALATION_SOLUTION | RESPIRATORY_TRACT | Status: DC | PRN

## 2013-08-24 MED ORDER — METRONIDAZOLE IN NACL 5-0.79 MG/ML-% IV SOLN
500.0000 mg | Freq: Once | INTRAVENOUS | Status: AC
  Administered 2013-08-24: 500 mg via INTRAVENOUS
  Filled 2013-08-24: qty 100

## 2013-08-24 MED ORDER — CIPROFLOXACIN IN D5W 400 MG/200ML IV SOLN
400.0000 mg | INTRAVENOUS | Status: DC
  Administered 2013-08-25 – 2013-08-27 (×3): 400 mg via INTRAVENOUS
  Filled 2013-08-24 (×2): qty 200

## 2013-08-24 MED ORDER — POTASSIUM CHLORIDE 10 MEQ/100ML IV SOLN
10.0000 meq | INTRAVENOUS | Status: AC
  Administered 2013-08-24 – 2013-08-25 (×3): 10 meq via INTRAVENOUS
  Filled 2013-08-24 (×3): qty 100

## 2013-08-24 MED ORDER — SODIUM CHLORIDE 0.9 % IV SOLN
INTRAVENOUS | Status: DC
  Administered 2013-08-24: 22:00:00 via INTRAVENOUS

## 2013-08-24 MED ORDER — ONDANSETRON HCL 4 MG/2ML IJ SOLN: 4.0000 mg | Freq: Four times a day (QID) | INTRAMUSCULAR | Status: DC | PRN

## 2013-08-24 NOTE — ED Notes (Signed)
Pelvic pain for 3 hours PTA. Wants to see Dr Michela Pitcher because "her bladder has come loose". Has had previous bladder tacking.

## 2013-08-24 NOTE — H&P (Signed)
[facility] History and Physical  Kimberly [name] [id number] DOB: Sep 14, 1926 DOA: 08/24/2013   PCP: Anthoney Harada, MD  Specialists: She's followed by Dr. Michela Pitcher with urology. Followed by cardiology (Dr. Sallyanne Kuster) for history of atrial fibrillation and pacemaker  Chief Complaint: Abdominal pain  HPI: Kimberly [name] is a 80 y.o. female with a past medical history of atrial fibrillation with pacemaker on chronic anticoagulation, arthritis, fibromyalgia, who has had lower abdominal discomfort for the past many months. She is thought to have some kind of urological issue for which she follows up with a urologist. This morning the pain got acutely worse and it moved more to left lower abdomen. It was sharp pain, 10 out of 10 in intensity was associated with nausea without vomiting. Denies any radiation of the pain. No precipitating factor. Pain is aggravated by pressing on the abdomen. Relieved partially with pain medications. Had some dizziness, but denies any syncopal episode. Denies any dysuria or hematuria. She has a history of constipation and strains a lot every time she had a bowel movement. She has a BM only every 2-3 days. She had a bowel movement this morning, but had to strain quite a bit. Denies any blood in the stools or black colored stools. She reports a colonoscopy a few years ago, done at [facility] which she states was apparently normal. Does not know if she's lost any weight. Denies any fever or chills. Denies any vaginal discharge or bleeding. She's never had this problem before.  Home Medications: Prior to Admission medications   Medication Sig Start Date End Date Taking? Authorizing Provider  apixaban (ELIQUIS) 2.5 MG TABS tablet Take 2.5 mg by mouth 2 (two) times daily.   Yes Historical Provider, MD  carvedilol (COREG) 6.25 MG tablet Take 1 tablet (6.25 mg total) by mouth 2 (two) times daily with a meal. 08/02/13  Yes Lorretta Harp, MD  Doxepin HCl 3 MG TABS  Take 1 tablet by mouth at bedtime. 06/15/13  Yes Tammy Eckard, PHARMD  furosemide (LASIX) 20 MG tablet Take 2 tablets (40 mg total) by mouth daily. No more refills without an appointment. 08/02/13   Lorretta Harp, MD  HYDROcodone-acetaminophen (NORCO/VICODIN) 5-325 MG per tablet Take 1 tablet by mouth 2 (two) times daily.     Historical Provider, MD  omeprazole (PRILOSEC) 20 MG capsule Take 20 mg by mouth daily.    Historical Provider, MD    Allergies:  Allergies  Allergen Reactions  . Iodine Rash and Other (See Comments)    REACTION:If injected,  Rash/irritated skin reaction "welts"  . Penicillins Hives  . Sulfa Antibiotics Rash    Past Medical History: Past Medical History  Diagnosis Date  . Depression   . Atrial fibrillation   . Scoliosis   . Osteoarthritis   . Pacemaker   . Fibromyalgia   . Hypertension   . Glaucoma     Past Surgical History  Procedure Laterality Date  . Pacemaker insertion    . Abdominal hysterectomy    . Cholecystectomy    . Appendectomy    . Breast surgery    . Back surgery    . Neck surgery    . Hernia repair      right inguinal hernia and umbilical  . Tonsillectomy    . Esophagogastroduodenoscopy N/A 10/13/2012    Procedure: ESOPHAGOGASTRODUODENOSCOPY (EGD);  Surgeon: Daneil Dolin, MD;  Location: [facility] ENDO SUITE;  Service: Endoscopy;  Laterality: N/A;  . Cystoscopy N/A 02/24/2013  Procedure: CYSTOSCOPY WITH URETHRAL DILITATION;  Surgeon: Marissa Nestle, MD;  Location: [facility] ORS;  Service: Urology;  Laterality: N/A;    Social History: Patient lives in [place] by herself. A home health nurse comes by to check on her once in a while. She holds on to her furniture to get around the house. No smoking currently. She quit about 12 years ago. Has wine about 4 or 5 times a week.  Family History:  Family History  Problem Relation Age of Onset  . Cancer Sister   . Asthma Sister   . Heart failure Brother   . Colon cancer Neg Hx      Review of  Systems - History obtained from the patient General ROS: positive for  - fatigue Psychological ROS: negative Ophthalmic ROS: negative ENT ROS: negative Allergy and Immunology ROS: negative Hematological and Lymphatic ROS: negative Endocrine ROS: negative Respiratory ROS: no cough, shortness of breath, or wheezing Cardiovascular ROS: no chest pain or dyspnea on exertion Gastrointestinal ROS: as in hpi Genito-Urinary ROS: no dysuria, trouble voiding, or hematuria Musculoskeletal ROS: negative Neurological ROS: no TIA or stroke symptoms Dermatological ROS: negative  Physical Examination  Filed Vitals:   08/24/13 1722 08/24/13 1937  BP: 119/61 111/44  Pulse: 72 72  Temp: 97.4 F (36.3 C) 98.2 F (36.8 C)  TempSrc: Oral Oral  Resp: 20 18  Height: $Remove'5\' 5"'bRcxfQW$  (1.651 m)   Weight: 63.504 kg (140 lb)   SpO2: 99% 97%    BP 111/44  Pulse 72  Temp(Src) 98.2 F (36.8 C) (Oral)  Resp 18  Ht $R'5\' 5"'yE$  (1.651 m)  Wt 63.504 kg (140 lb)  BMI 23.30 kg/m2  SpO2 97%  General appearance: alert, cooperative, appears stated age and no distress Head: Normocephalic, without obvious abnormality, atraumatic Eyes: conjunctivae/corneas clear. PERRL, EOM's intact. . Throat: Dry Mucous membranes Neck: no adenopathy, no carotid bruit, no JVD, supple, symmetrical, trachea midline and thyroid not enlarged, symmetric, no tenderness/mass/nodules Resp: clear to auscultation bilaterally Cardio: regular rate and rhythm, S1, S2 normal, no murmur, click, rub or gallop GI: Appears to be slightly distended. Bowel sounds are sluggish. Tender diffusely, but more so in the left lower quadrant without any rebound, rigidity, or guarding. No masses, organomegaly. Extremities: Arthritic changes noted in the hands. No pitting edema. Pulses: 2+ and symmetric Lymph nodes: Cervical, supraclavicular, and axillary nodes normal. Neurologic: She is alert and oriented x3. Cranial nerves intact. Motor strength is equal in bilateral  upper and lower extremities. Gait not assessed.  Laboratory Data: Results for orders placed during the hospital encounter of 08/24/13 (from the past 48 hour(s))  URINALYSIS, ROUTINE W REFLEX MICROSCOPIC     Status: Abnormal   Collection Time    08/24/13  5:43 PM      Result Value Ref Range   Color, Urine YELLOW  YELLOW   APPearance CLEAR  CLEAR   Specific Gravity, Urine 1.010  1.005 - 1.030   pH 5.5  5.0 - 8.0   Glucose, UA NEGATIVE  NEGATIVE mg/dL   Hgb urine dipstick NEGATIVE  NEGATIVE   Bilirubin Urine NEGATIVE  NEGATIVE   Ketones, ur NEGATIVE  NEGATIVE mg/dL   Protein, ur NEGATIVE  NEGATIVE mg/dL   Urobilinogen, UA 0.2  0.0 - 1.0 mg/dL   Nitrite NEGATIVE  NEGATIVE   Leukocytes, UA TRACE (*) NEGATIVE  URINE MICROSCOPIC-ADD ON     Status: None   Collection Time    08/24/13  5:43 PM  Result Value Ref Range   Squamous Epithelial / LPF RARE  RARE   WBC, UA 3-6  <3 WBC/hpf   Bacteria, UA RARE  RARE  CBC WITH DIFFERENTIAL     Status: Abnormal   Collection Time    08/24/13  5:53 PM      Result Value Ref Range   WBC 12.8 (*) 4.0 - 10.5 K/uL   RBC 3.78 (*) 3.87 - 5.11 MIL/uL   Hemoglobin 11.6 (*) 12.0 - 15.0 g/dL   HCT 35.4 (*) 36.0 - 46.0 %   MCV 93.7  78.0 - 100.0 fL   MCH 30.7  26.0 - 34.0 pg   MCHC 32.8  30.0 - 36.0 g/dL   RDW 14.5  11.5 - 15.5 %   Platelets 231  150 - 400 K/uL   Neutrophils Relative % 86 (*) 43 - 77 %   Neutro Abs 11.0 (*) 1.7 - 7.7 K/uL   Lymphocytes Relative 9 (*) 12 - 46 %   Lymphs Abs 1.2  0.7 - 4.0 K/uL   Monocytes Relative 4  3 - 12 %   Monocytes Absolute 0.5  0.1 - 1.0 K/uL   Eosinophils Relative 1  0 - 5 %   Eosinophils Absolute 0.1  0.0 - 0.7 K/uL   Basophils Relative 0  0 - 1 %   Basophils Absolute 0.0  0.0 - 0.1 K/uL  COMPREHENSIVE METABOLIC PANEL     Status: Abnormal   Collection Time    08/24/13  5:53 PM      Result Value Ref Range   Sodium 140  137 - 147 mEq/L   Potassium 3.2 (*) 3.7 - 5.3 mEq/L   Chloride 98  96 - 112 mEq/L    CO2 28  19 - 32 mEq/L   Glucose, Bld 112 (*) 70 - 99 mg/dL   BUN 21  6 - 23 mg/dL   Creatinine, Ser 1.33 (*) 0.50 - 1.10 mg/dL   Calcium 9.2  8.4 - 10.5 mg/dL   Total Protein 7.4  6.0 - 8.3 g/dL   Albumin 3.5  3.5 - 5.2 g/dL   AST 15  0 - 37 U/L   ALT 7  0 - 35 U/L   Alkaline Phosphatase 88  39 - 117 U/L   Total Bilirubin 0.3  0.3 - 1.2 mg/dL   GFR calc non Af Amer 36 (*) >90 mL/min   GFR calc Af Amer 41 (*) >90 mL/min   Comment: (NOTE)     The eGFR has been calculated using the CKD EPI equation.     This calculation has not been validated in all clinical situations.     eGFR's persistently <90 mL/min signify possible Chronic Kidney     Disease.  LIPASE, BLOOD     Status: None   Collection Time    08/24/13  5:53 PM      Result Value Ref Range   Lipase 29  11 - 59 U/L  PROTIME-INR     Status: None   Collection Time    08/24/13  5:53 PM      Result Value Ref Range   Prothrombin Time 15.1  11.6 - 15.2 seconds   INR 1.22  0.00 - 1.49    Radiology Reports: Ct Abdomen Pelvis Wo Contrast  08/24/2013   CLINICAL DATA:  Pelvic pain.  EXAM: CT ABDOMEN AND PELVIS WITHOUT CONTRAST  TECHNIQUE: Multidetector CT imaging of the abdomen and pelvis was performed following the standard protocol without  IV contrast.  COMPARISON:  CT ABD - PELV W/ CM dated 12/26/2012  FINDINGS: Large hiatal hernia.  Perinephric stranding is symmetrical. No hydronephrosis. No urinary calculus.  There is slight wall thickening of the sigmoid colon and stranding in the adjacent fat. A small bubble of extraluminal bowel gas projects just inferior to the sigmoid colon compatible with micro perforation. Discrepancy from a diverticulum as best appreciated on coronal reformat images. No abscess.  Bladder is unremarkable.  Uterus is absent.  Stable hypodensities in the liver. Spleen is within normal limits. Pancreas is atrophic. Stable adrenal glands.  Severe scoliosis.  Stable T12 compression deformity.  IMPRESSION: Acute  diverticulitis of the sigmoid colon with a small amount of extraluminal bowel gas compatible with microperforation. No abscess. Critical Value/emergent results were called by telephone at the time of interpretation on 08/24/2013 at 6:45 PM to Dr. Ezequiel Essex , who verbally acknowledged these results.   Electronically Signed   By: Maryclare Bean M.D.   On: 08/24/2013 18:46    Problem List  Principal Problem:   Acute diverticulitis Active Problems:   Atrial fibrillation   Pacemaker   Fibromyalgia   CKD (chronic kidney disease) stage 3, GFR 30-59 ml/min   Chronic anticoagulation   Assessment: This is a 80 year old, Caucasian female, who presents with left abdominal pain, and was found to have acute diverticulitis. Genitourinary exam was done by the ED physician and did not show anything acute. Symptoms are most likely due to the acute inflammation.  Plan: #1 acute diverticulitis with microperforation: No abscess noted on CT scan. She'll be treated with Cipro and Flagyl. Surgery consultation will be obtained in the morning. Patient will be kept n.p.o. except for medications. Pain medications will be provided. IV fluids will be initiated. The key will be to avoid constipation and straining in the future. She will need to be discharged on a good bowel regimen. We'll also check TSH level to evaluate for causes of constipation. Will defer further endoscopies to her primary care physician.  #2 history of atrial fibrillation with pacemaker: Heart rate is well controlled. Continue with her anticoagulation. Continue with beta blocker.  #3 history of chronic kidney disease, stage III: Creatinine, appears to be at baseline. Monitor urine output.   DVT Prophylaxis: She is on full anticoagulation Code Status: Full code Family Communication: Discussed with the patient  Disposition Plan: Admit to [place]. She will require PT and OT consult.   Further management decisions will depend on results of further  testing and patient's response to treatment.   Bonnielee Haff  [facility] Pager [phone]  If 7PM-7AM, please contact night-coverage [email or web address] Password [facility]  08/24/2013, 7:44 PM

## 2013-08-24 NOTE — ED Provider Notes (Signed)
CSN: [id number]     Arrival date & time 08/24/13  1716 History  This chart was scribed for Ezequiel Essex, MD by Jenne Campus, ED Scribe. This patient was seen in room APA04/[facility]04 and the patient's care was started at 5:41 PM.    Chief Complaint  Patient presents with  . Pelvic Pain    The history is provided by the patient. No language interpreter was used.   HPI Comments: Kimberly [name] is a 80 y.o. female who presents to the Emergency Department complaining of chronic pelvic pain presents for a few months that has gradually gotten worse over past few days. Pt complains of pain in the suprapubic and pelvic region saying it feels as if her bladder has "become detached". She states she has associated symptoms of mild constipation that resolved this morning. Pt states the pain is exacerbated  with most movement. She also states that nothing relieves her of the pain including her Hydrocodone prescription for the same. She denies any symptoms of emesis, fevers, dysuria or hematuria.   Pt also has a past surgical history of abdominal hysterectomy and bilateral oophorectomy with bladder tack 42 years ago, choleystectomy, and appendectomy.   PCP Dr. Jacelyn Grip  Past Medical History  Diagnosis Date  . Depression   . Atrial fibrillation   . Scoliosis   . Osteoarthritis   . Pacemaker   . Fibromyalgia   . Hypertension   . Glaucoma    Past Surgical History  Procedure Laterality Date  . Pacemaker insertion    . Abdominal hysterectomy    . Cholecystectomy    . Appendectomy    . Breast surgery    . Back surgery    . Neck surgery    . Hernia repair      right inguinal hernia and umbilical  . Tonsillectomy    . Esophagogastroduodenoscopy N/A 10/13/2012    Procedure: ESOPHAGOGASTRODUODENOSCOPY (EGD);  Surgeon: Daneil Dolin, MD;  Location: [facility];  Service: Endoscopy;  Laterality: N/A;  . Cystoscopy N/A 02/24/2013    Procedure: CYSTOSCOPY WITH URETHRAL DILITATION;  Surgeon: Marissa Nestle, MD;  Location: [facility]S;  Service: Urology;  Laterality: N/A;   Family History  Problem Relation Age of Onset  . Cancer Sister   . Asthma Sister   . Heart failure Brother   . Colon cancer Neg Hx    History  Substance Use Topics  . Smoking status: Former Smoker -- 0.50 packs/day for 30 years    Types: Cigarettes    Quit date: 12/13/2004  . Smokeless tobacco: Not on file  . Alcohol Use: 0.6 oz/week    1 Glasses of wine per week     Comment: several glasses of wine each evening   OB History   Grav Para Term Preterm Abortions TAB SAB Ect Mult Living   5 2 1 1 3  3   2      Review of Systems  A complete 10 system review of systems was obtained and all systems are negative except as noted in the HPI and PMH.   Allergies  Iodine; Penicillins; and Sulfa antibiotics  Home Medications   Prior to Admission medications   Medication Sig Start Date End Date Taking? Authorizing Provider  carvedilol (COREG) 6.25 MG tablet Take 1 tablet (6.25 mg total) by mouth 2 (two) times daily with a meal. 08/02/13   Lorretta Harp, MD  Doxepin HCl (SILENOR) 3 MG TABS Take 1 tablet (3 mg total) by mouth  at bedtime as needed. 06/15/13   Tammy Eckard, PHARMD  ELIQUIS 2.5 MG TABS tablet Take 1 tablet by mouth two  times daily 06/11/13   Tammy Eckard, PHARMD  furosemide (LASIX) 20 MG tablet Take 2 tablets (40 mg total) by mouth daily. No more refills without an appointment. 08/02/13   Lorretta Harp, MD  HYDROcodone-acetaminophen (NORCO/VICODIN) 5-325 MG per tablet Take 1 tablet by mouth 2 (two) times daily.     Historical Provider, MD  omeprazole (PRILOSEC) 20 MG capsule Take 20 mg by mouth daily.    Historical Provider, MD   Triage vitals: BP 119/61  Pulse 72  Temp(Src) 97.4 F (36.3 C) (Oral)  Resp 20  Ht 5\' 5"  (1.651 m)  Wt 140 lb (63.504 kg)  BMI 23.30 kg/m2  SpO2 99%  Physical Exam  Nursing note and vitals reviewed. Constitutional: She is oriented to person, place, and time. She appears  well-developed and well-nourished. No distress.  HENT:  Head: Normocephalic and atraumatic.  Eyes: EOM are normal.  Neck: Neck supple. No tracheal deviation present.  Cardiovascular: Normal rate, regular rhythm and normal heart sounds.   No murmur heard. Pulmonary/Chest: Effort normal and breath sounds normal. No respiratory distress.  Abdominal: Soft. There is tenderness. There is no rebound and no guarding.  Diffuse lower abdominal tenderness, No CVA tenderness.  Genitourinary:  NEFG, no fecal impaction, guiac negative, No evidence organ prolapse. Chaperone present.  Musculoskeletal: Normal range of motion.  Neurological: She is alert and oriented to person, place, and time.  Skin: Skin is warm and dry.  Psychiatric: She has a normal mood and affect. Her behavior is normal.    ED Course  Procedures (including critical care time)  DIAGNOSTIC STUDIES: Oxygen Saturation is 99% on RA, Normal by my interpretation.    COORDINATION OF CARE: 5:55 PM-Discussed treatment plan which includes CT scan of abdomen with pt at bedside and pt agreed to plan.      Labs Review Labs Reviewed  URINALYSIS, ROUTINE W REFLEX MICROSCOPIC - Abnormal; Notable for the following:    Leukocytes, UA TRACE (*)    All other components within normal limits  CBC WITH DIFFERENTIAL - Abnormal; Notable for the following:    WBC 12.8 (*)    RBC 3.78 (*)    Hemoglobin 11.6 (*)    HCT 35.4 (*)    Neutrophils Relative % 86 (*)    Neutro Abs 11.0 (*)    Lymphocytes Relative 9 (*)    All other components within normal limits  COMPREHENSIVE METABOLIC PANEL - Abnormal; Notable for the following:    Potassium 3.2 (*)    Glucose, Bld 112 (*)    Creatinine, Ser 1.33 (*)    GFR calc non Af Amer 36 (*)    GFR calc Af Amer 41 (*)    All other components within normal limits  LIPASE, BLOOD  PROTIME-INR  URINE MICROSCOPIC-ADD ON    Imaging Review Ct Abdomen Pelvis Wo Contrast  08/24/2013   CLINICAL DATA:   Pelvic pain.  EXAM: CT ABDOMEN AND PELVIS WITHOUT CONTRAST  TECHNIQUE: Multidetector CT imaging of the abdomen and pelvis was performed following the standard protocol without IV contrast.  COMPARISON:  CT ABD - PELV W/ CM dated 12/26/2012  FINDINGS: Large hiatal hernia.  Perinephric stranding is symmetrical. No hydronephrosis. No urinary calculus.  There is slight wall thickening of the sigmoid colon and stranding in the adjacent fat. A small bubble of extraluminal bowel gas projects just inferior  to the sigmoid colon compatible with micro perforation. Discrepancy from a diverticulum as best appreciated on coronal reformat images. No abscess.  Bladder is unremarkable.  Uterus is absent.  Stable hypodensities in the liver. Spleen is within normal limits. Pancreas is atrophic. Stable adrenal glands.  Severe scoliosis.  Stable T12 compression deformity.  IMPRESSION: Acute diverticulitis of the sigmoid colon with a small amount of extraluminal bowel gas compatible with microperforation. No abscess. Critical Value/emergent results were called by telephone at the time of interpretation on 08/24/2013 at 6:45 PM to Dr. Ezequiel Essex , who verbally acknowledged these results.   Electronically Signed   By: Maryclare Bean M.D.   On: 08/24/2013 18:46     EKG Interpretation None      MDM   Final diagnoses:  Diverticulitis   Ongoing lower abdominal pelvic pain for the past several months worse in the past days. Patient reports she takes nothing for this pain. She is worried about her bladder and she has had a bladder tack in the past. Previous cholecystectomy, appendectomy, hysterectomy.  White blood cell count 12.8. Patient with diffuse lower abdominal tenderness without peritoneal signs. No evidence of organ prolapse on gross inspection.  UA negative. Labs unremarkable other than leukocytosis. CT shows acute diverticulitis with microperforation. No abscess. We'll start Cipro, Flagyl, admit to hospitalist for  observation given microperforation.  I personally performed the services described in this documentation, which was scribed in my presence. The recorded information has been reviewed and is accurate.      Ezequiel Essex, MD 08/24/13 1944

## 2013-08-25 DIAGNOSIS — K5732 Diverticulitis of large intestine without perforation or abscess without bleeding: Principal | ICD-10-CM

## 2013-08-25 DIAGNOSIS — D72829 Elevated white blood cell count, unspecified: Secondary | ICD-10-CM

## 2013-08-25 DIAGNOSIS — D649 Anemia, unspecified: Secondary | ICD-10-CM | POA: Diagnosis present

## 2013-08-25 DIAGNOSIS — I10 Essential (primary) hypertension: Secondary | ICD-10-CM

## 2013-08-25 DIAGNOSIS — I4891 Unspecified atrial fibrillation: Secondary | ICD-10-CM

## 2013-08-25 LAB — COMPREHENSIVE METABOLIC PANEL
ALBUMIN: 2.7 g/dL — AB (ref 3.5–5.2)
ALK PHOS: 68 U/L (ref 39–117)
ALT: 5 U/L (ref 0–35)
AST: 14 U/L (ref 0–37)
BILIRUBIN TOTAL: 0.5 mg/dL (ref 0.3–1.2)
BUN: 21 mg/dL (ref 6–23)
CHLORIDE: 104 meq/L (ref 96–112)
CO2: 25 mEq/L (ref 19–32)
CREATININE: 1.4 mg/dL — AB (ref 0.50–1.10)
Calcium: 8.5 mg/dL (ref 8.4–10.5)
GFR, EST AFRICAN AMERICAN: 39 mL/min — AB (ref >90)
GFR, EST NON AFRICAN AMERICAN: 33 mL/min — AB (ref >90)
GLUCOSE: 93 mg/dL (ref 70–99)
Potassium: 3.9 mEq/L (ref 3.7–5.3)
Sodium: 141 mEq/L (ref 137–147)
Total Protein: 5.9 g/dL — ABNORMAL LOW (ref 6.0–8.3)

## 2013-08-25 LAB — CBC
HCT: 28.3 % — ABNORMAL LOW (ref 36.0–46.0)
Hemoglobin: 9.6 g/dL — ABNORMAL LOW (ref 12.0–15.0)
MCH: 31.7 pg (ref 26.0–34.0)
MCHC: 33.9 g/dL (ref 30.0–36.0)
MCV: 93.4 fL (ref 78.0–100.0)
Platelets: 161 10*3/uL (ref 150–400)
RBC: 3.03 MIL/uL — AB (ref 3.87–5.11)
RDW: 14.5 % (ref 11.5–15.5)
WBC: 12.3 10*3/uL — AB (ref 4.0–10.5)

## 2013-08-25 LAB — TSH: TSH: 2.16 u[IU]/mL (ref 0.350–4.500)

## 2013-08-25 NOTE — Care Management Utilization Note (Signed)
UR completed 

## 2013-08-25 NOTE — Evaluation (Signed)
Physical Therapy Evaluation Patient Details Name: Kimberly [name] MRN: [id number] DOB: [birth year] Today's Date: 08/25/2013   History of Present Illness  Pt was admitted with acute diverticulitis.  She has a hx of Afib with pacemeaker, OA, fibromyalgia and severe scoliosis.  She lives alone with the support of her niece.  Clinical Impression   Pt was seen for evaluation.  She needed encouragement to work with me but was finally agreeable.  She is independent with transfers and gait using a walker for 60'.  She is deconditioned at baseline but has her life set up the way she wants and is happy with this.  We will ask nursing service to ambulate her while in hospital.    Follow Up Recommendations No PT follow up    Equipment Recommendations  None recommended by PT    Recommendations for Other Services   none    Precautions / Restrictions Precautions Precautions: None Restrictions Weight Bearing Restrictions: No      Mobility  Bed Mobility Overal bed mobility: Modified Independent                Transfers Overall transfer level: Modified independent                  Ambulation/Gait Ambulation/Gait assistance: Modified independent (Device/Increase time) Ambulation Distance (Feet): 60 Feet Assistive device: Rolling walker (2 wheeled) Gait Pattern/deviations: Trunk flexed   Gait velocity interpretation: Below normal speed for age/gender General Gait Details: gait is slow and labored, pt reports sitting most of the time at home...she does not like to use a walker as it makes her arms tired                      Balance Overall balance assessment: No apparent balance deficits (not formally assessed)                                             Home Living Family/patient expects to be discharged to:: Private residence Living Arrangements: Alone Available Help at Discharge: Family;Friend(s) Type of Home: House Home Access: Stairs to  enter Entrance Stairs-Rails: None Entrance Stairs-Number of Steps: 2 Home Layout: One level Home Equipment: [place] - 2 wheels;Bedside commode;Shower seat;Grab bars - tub/shower;Hand held shower head      Prior Function Level of Independence: Needs assistance   Gait / Transfers Assistance Needed: pt "furniture walks" at home, niece takes her outside in the community  ADL's / Homemaking Assistance Needed: Pt's great niece drives her to appointments, and pt's neighbor assists with grocery shopping and taking out the trash/recycling. Pt has been modified independent in all other ADLs - standing for cooking tires her, but is is able to complete,.        Hand Dominance   Dominant Hand: Right    Extremity/Trunk Assessment   Upper Extremity Assessment: Overall WFL for tasks assessed           Lower Extremity Assessment: Overall WFL for tasks assessed         Communication   Communication: No difficulties  Cognition Arousal/Alertness: Awake/alert Behavior During Therapy: WFL for tasks assessed/performed Overall Cognitive Status: Within Functional Limits for tasks assessed  Assessment/Plan    PT Assessment Patent does not need any further PT services  PT Diagnosis     PT Problem List    PT Treatment Interventions     PT Goals (Current goals can be found in the Care Plan section) Acute Rehab PT Goals Patient Stated Goal: No OT goals needed PT Goal Formulation: No goals set, d/c therapy                                End of Session Equipment Utilized During Treatment: Gait belt Activity Tolerance: Patient limited by fatigue Patient left: in bed;with call bell/phone within reach           Time: 1043-1106 PT Time Calculation (min): 23 min   Charges:   PT Evaluation $Initial PT Evaluation Tier I: 1 Procedure     PT G Codes:          Sable Feil 08/25/2013, 11:22 AM

## 2013-08-25 NOTE — Progress Notes (Signed)
[facility] PROGRESS NOTE  Kimberly [name] [id number] DOB: 1926/06/20 DOA: 08/24/2013 PCP: Anthoney Harada, MD  Summary: This is a 80 year old, Caucasian female, who presents with left abdominal pain, and was found to have acute diverticulitis. Genitourinary exam was done by the ED physician and did not show anything acute. Symptoms are most likely due to the acute inflammation. General surgery following.   Assessment/Plan: #1 acute diverticulitis with microperforation: reports no improvement this am.  No abscess noted on CT scan. Max temp 99.5. Leukocytosis trended down slightly. Non-toxic appearing.  Continue  Cipro and Flagyl day #2.  Appreciate surgery input. Will advance to clear sips. Continue pain medications. The key will be to avoid constipation and straining in the future. She will need to be discharged on a good bowel regimen. TSH level pending. Will defer further endoscopies to her primary care physician.   #2 history of atrial fibrillation with pacemaker: Heart rate controlled. Continue with her anticoagulation. Continue with beta blocker.   #3 history of chronic kidney disease, stage III: Creatinine, remains at baseline. Monitor urine output.   #4. HTN. On the soft side this am. Home meds include lasix and coreg. Continuing coreg and holding lasix. Monitor closely  #5. Anemia: Hg 9.6. Chart review indicates baseline 11-12. May be dilutional. No s/sx bleeding. Will check FOBT and anemia panel.   #6. Leukocytosis. Related to #1. Trending down slightly. Max temp 99.5. Non-toxic appearing.     Code Status: full Family Communication: none present Disposition Plan: home when ready   Consultants:  General surgery  Procedures:  none  Antibiotics:  Cipro 08/24/13>>>  Flagyl 08/24/13>>>  HPI/Subjective: Sitting up in bed. Reports no improvement in abdominal pain this am. Denies nausea  Objective: Filed Vitals:   08/25/13 0542  BP: 87/52  Pulse: 69   Temp: 99.5 F (37.5 C)  Resp: 18    Intake/Output Summary (Last 24 hours) at 08/25/13 0953 Last data filed at 08/25/13 [id number]  Gross per 24 hour  Intake      0 ml  Output    100 ml  Net   -100 ml   Filed Weights   08/24/13 1722 08/24/13 2058  Weight: 63.504 kg (140 lb) 67.4 kg (148 lb 9.4 oz)    Exam:   General:  Well nourished pleasant NAD  Cardiovascular: RRR no m/g/r no LE edema  Respiratory: normal effort BS clear bilaterally no wheeze or rhonchi  Abdomen: slightly distended but soft . Sluggish BS throughout. Moderate tenderness in RLQ particularly. No rebound or guarding.   Musculoskeletal: good muscle tone.   Data Reviewed: Basic Metabolic Panel:  Recent Labs Lab 08/24/13 1753 08/25/13 0639  NA 140 141  K 3.2* 3.9  CL 98 104  CO2 28 25  GLUCOSE 112* 93  BUN 21 21  CREATININE 1.33* 1.40*  CALCIUM 9.2 8.5   Liver Function Tests:  Recent Labs Lab 08/24/13 1753 08/25/13 0639  AST 15 14  ALT 7 5  ALKPHOS 88 68  BILITOT 0.3 0.5  PROT 7.4 5.9*  ALBUMIN 3.5 2.7*    Recent Labs Lab 08/24/13 1753  LIPASE 29   No results found for this basename: AMMONIA,  in the last 168 hours CBC:  Recent Labs Lab 08/24/13 1753 08/25/13 0639  WBC 12.8* 12.3*  NEUTROABS 11.0*  --   HGB 11.6* 9.6*  HCT 35.4* 28.3*  MCV 93.7 93.4  PLT 231 161   Cardiac Enzymes: No results found for this basename: CKTOTAL, CKMB, CKMBINDEX, TROPONINI,  in the last 168 hours BNP (last 3 results) No results found for this basename: PROBNP,  in the last 8760 hours CBG: No results found for this basename: GLUCAP,  in the last 168 hours  No results found for this or any previous visit (from the past 240 hour(s)).   Studies: Ct Abdomen Pelvis Wo Contrast  08/24/2013   CLINICAL DATA:  Pelvic pain.  EXAM: CT ABDOMEN AND PELVIS WITHOUT CONTRAST  TECHNIQUE: Multidetector CT imaging of the abdomen and pelvis was performed following the standard protocol without IV contrast.   COMPARISON:  CT ABD - PELV W/ CM dated 12/26/2012  FINDINGS: Large hiatal hernia.  Perinephric stranding is symmetrical. No hydronephrosis. No urinary calculus.  There is slight wall thickening of the sigmoid colon and stranding in the adjacent fat. A small bubble of extraluminal bowel gas projects just inferior to the sigmoid colon compatible with micro perforation. Discrepancy from a diverticulum as best appreciated on coronal reformat images. No abscess.  Bladder is unremarkable.  Uterus is absent.  Stable hypodensities in the liver. Spleen is within normal limits. Pancreas is atrophic. Stable adrenal glands.  Severe scoliosis.  Stable T12 compression deformity.  IMPRESSION: Acute diverticulitis of the sigmoid colon with a small amount of extraluminal bowel gas compatible with microperforation. No abscess. Critical Value/emergent results were called by telephone at the time of interpretation on 08/24/2013 at 6:45 PM to Dr. Ezequiel Essex , who verbally acknowledged these results.   Electronically Signed   By: Maryclare Bean M.D.   On: 08/24/2013 18:46    Scheduled Meds: . apixaban  2.5 mg Oral BID  . carvedilol  6.25 mg Oral BID WC  . ciprofloxacin  400 mg Intravenous Q24H  . metronidazole  500 mg Intravenous Q8H   Continuous Infusions: . sodium chloride 75 mL/hr at 08/24/13 2137    Principal Problem:   Acute diverticulitis Active Problems:   Atrial fibrillation   Pacemaker   Fibromyalgia   Hypertension   CKD (chronic kidney disease) stage 3, GFR 30-59 ml/min   Chronic anticoagulation    Time spent: 35 minutes    [facility] Pager [phone]. If 7PM-7AM, please contact night-coverage at [email or web address], password [facility] 08/25/2013, 9:53 AM  LOS: 1 day

## 2013-08-25 NOTE — Consult Note (Signed)
Reason for Consult: Sigmoid diverticulitis with contained microperforation Referring Physician: Hospitalist  Kimberly [name] is an 80 y.o. female.  HPI: Patient is an 80 year old white female who presented emergency room with worsening abdominal pain. CT scan the abdomen revealed sigmoid diverticulitis with microperforation. It was contained. Patient states that this is her first episode of left lower quadrant abdominal pain. It is unknown whether she has had a colonoscopy in the past. Currently, she is just complaining of a dry mouth and left lower quadrant abdominal pain. She states that she does have a history of constipation.  Past Medical History  Diagnosis Date  . Depression   . Atrial fibrillation   . Scoliosis   . Osteoarthritis   . Pacemaker   . Fibromyalgia   . Hypertension   . Glaucoma     Past Surgical History  Procedure Laterality Date  . Pacemaker insertion    . Abdominal hysterectomy    . Cholecystectomy    . Appendectomy    . Breast surgery    . Back surgery    . Neck surgery    . Hernia repair      right inguinal hernia and umbilical  . Tonsillectomy    . Esophagogastroduodenoscopy N/A 10/13/2012    Procedure: ESOPHAGOGASTRODUODENOSCOPY (EGD);  Surgeon: Kimberly Dolin, MD;  Location: [facility];  Service: Endoscopy;  Laterality: N/A;  . Cystoscopy N/A 02/24/2013    Procedure: CYSTOSCOPY WITH URETHRAL DILITATION;  Surgeon: Kimberly Nestle, MD;  Location: [facility];  Service: Urology;  Laterality: N/A;    Family History  Problem Relation Age of Onset  . Cancer Sister   . Asthma Sister   . Heart failure Brother   . Colon cancer Neg Hx     Social History:  reports that she quit smoking about 8 years ago. Her smoking use included Cigarettes. She has a 15 pack-year smoking history. She does not have any smokeless tobacco history on file. She reports that she drinks about .6 ounces of alcohol per week. She reports that she does not use illicit drugs.  Allergies:   Allergies  Allergen Reactions  . Iodine Rash and Other (See Comments)    REACTION:If injected,  Rash/irritated skin reaction "welts"  . Penicillins Hives  . Sulfa Antibiotics Rash    Medications: I have reviewed the patient's current medications.  Results for orders placed during the hospital encounter of 08/24/13 (from the past 48 hour(s))  URINALYSIS, ROUTINE W REFLEX MICROSCOPIC     Status: Abnormal   Collection Time    08/24/13  5:43 PM      Result Value Ref Range   Color, Urine YELLOW  YELLOW   APPearance CLEAR  CLEAR   Specific Gravity, Urine 1.010  1.005 - 1.030   pH 5.5  5.0 - 8.0   Glucose, UA NEGATIVE  NEGATIVE mg/dL   Hgb urine dipstick NEGATIVE  NEGATIVE   Bilirubin Urine NEGATIVE  NEGATIVE   Ketones, ur NEGATIVE  NEGATIVE mg/dL   Protein, ur NEGATIVE  NEGATIVE mg/dL   Urobilinogen, UA 0.2  0.0 - 1.0 mg/dL   Nitrite NEGATIVE  NEGATIVE   Leukocytes, UA TRACE (*) NEGATIVE  URINE MICROSCOPIC-ADD ON     Status: None   Collection Time    08/24/13  5:43 PM      Result Value Ref Range   Squamous Epithelial / LPF RARE  RARE   WBC, UA 3-6  <3 WBC/hpf   Bacteria, UA RARE  RARE  CBC  WITH DIFFERENTIAL     Status: Abnormal   Collection Time    08/24/13  5:53 PM      Result Value Ref Range   WBC 12.8 (*) 4.0 - 10.5 K/uL   RBC 3.78 (*) 3.87 - 5.11 MIL/uL   Hemoglobin 11.6 (*) 12.0 - 15.0 g/dL   HCT 35.4 (*) 36.0 - 46.0 %   MCV 93.7  78.0 - 100.0 fL   MCH 30.7  26.0 - 34.0 pg   MCHC 32.8  30.0 - 36.0 g/dL   RDW 14.5  11.5 - 15.5 %   Platelets 231  150 - 400 K/uL   Neutrophils Relative % 86 (*) 43 - 77 %   Neutro Abs 11.0 (*) 1.7 - 7.7 K/uL   Lymphocytes Relative 9 (*) 12 - 46 %   Lymphs Abs 1.2  0.7 - 4.0 K/uL   Monocytes Relative 4  3 - 12 %   Monocytes Absolute 0.5  0.1 - 1.0 K/uL   Eosinophils Relative 1  0 - 5 %   Eosinophils Absolute 0.1  0.0 - 0.7 K/uL   Basophils Relative 0  0 - 1 %   Basophils Absolute 0.0  0.0 - 0.1 K/uL  COMPREHENSIVE METABOLIC PANEL      Status: Abnormal   Collection Time    08/24/13  5:53 PM      Result Value Ref Range   Sodium 140  137 - 147 mEq/L   Potassium 3.2 (*) 3.7 - 5.3 mEq/L   Chloride 98  96 - 112 mEq/L   CO2 28  19 - 32 mEq/L   Glucose, Bld 112 (*) 70 - 99 mg/dL   BUN 21  6 - 23 mg/dL   Creatinine, Ser 1.33 (*) 0.50 - 1.10 mg/dL   Calcium 9.2  8.4 - 10.5 mg/dL   Total Protein 7.4  6.0 - 8.3 g/dL   Albumin 3.5  3.5 - 5.2 g/dL   AST 15  0 - 37 U/L   ALT 7  0 - 35 U/L   Alkaline Phosphatase 88  39 - 117 U/L   Total Bilirubin 0.3  0.3 - 1.2 mg/dL   GFR calc non Af Amer 36 (*) >90 mL/min   GFR calc Af Amer 41 (*) >90 mL/min   Comment: (NOTE)     The eGFR has been calculated using the CKD EPI equation.     This calculation has not been validated in all clinical situations.     eGFR's persistently <90 mL/min signify possible Chronic Kidney     Disease.  LIPASE, BLOOD     Status: None   Collection Time    08/24/13  5:53 PM      Result Value Ref Range   Lipase 29  11 - 59 U/L  PROTIME-INR     Status: None   Collection Time    08/24/13  5:53 PM      Result Value Ref Range   Prothrombin Time 15.1  11.6 - 15.2 seconds   INR 1.22  0.00 - 1.49  COMPREHENSIVE METABOLIC PANEL     Status: Abnormal   Collection Time    08/25/13  6:39 AM      Result Value Ref Range   Sodium 141  137 - 147 mEq/L   Potassium 3.9  3.7 - 5.3 mEq/L   Comment: DELTA CHECK NOTED   Chloride 104  96 - 112 mEq/L   CO2 25  19 - 32 mEq/L  Glucose, Bld 93  70 - 99 mg/dL   BUN 21  6 - 23 mg/dL   Creatinine, Ser 1.40 (*) 0.50 - 1.10 mg/dL   Calcium 8.5  8.4 - 10.5 mg/dL   Total Protein 5.9 (*) 6.0 - 8.3 g/dL   Albumin 2.7 (*) 3.5 - 5.2 g/dL   AST 14  0 - 37 U/L   ALT 5  0 - 35 U/L   Alkaline Phosphatase 68  39 - 117 U/L   Total Bilirubin 0.5  0.3 - 1.2 mg/dL   GFR calc non Af Amer 33 (*) >90 mL/min   GFR calc Af Amer 39 (*) >90 mL/min   Comment: (NOTE)     The eGFR has been calculated using the CKD EPI equation.     This  calculation has not been validated in all clinical situations.     eGFR's persistently <90 mL/min signify possible Chronic Kidney     Disease.  CBC     Status: Abnormal   Collection Time    08/25/13  6:39 AM      Result Value Ref Range   WBC 12.3 (*) 4.0 - 10.5 K/uL   RBC 3.03 (*) 3.87 - 5.11 MIL/uL   Hemoglobin 9.6 (*) 12.0 - 15.0 g/dL   Comment: DELTA CHECK NOTED     RESULT REPEATED AND VERIFIED   HCT 28.3 (*) 36.0 - 46.0 %   MCV 93.4  78.0 - 100.0 fL   MCH 31.7  26.0 - 34.0 pg   MCHC 33.9  30.0 - 36.0 g/dL   RDW 14.5  11.5 - 15.5 %   Platelets 161  150 - 400 K/uL   Comment: DELTA CHECK NOTED    Ct Abdomen Pelvis Wo Contrast  08/24/2013   CLINICAL DATA:  Pelvic pain.  EXAM: CT ABDOMEN AND PELVIS WITHOUT CONTRAST  TECHNIQUE: Multidetector CT imaging of the abdomen and pelvis was performed following the standard protocol without IV contrast.  COMPARISON:  CT ABD - PELV W/ CM dated 12/26/2012  FINDINGS: Large hiatal hernia.  Perinephric stranding is symmetrical. No hydronephrosis. No urinary calculus.  There is slight wall thickening of the sigmoid colon and stranding in the adjacent fat. A small bubble of extraluminal bowel gas projects just inferior to the sigmoid colon compatible with micro perforation. Discrepancy from a diverticulum as best appreciated on coronal reformat images. No abscess.  Bladder is unremarkable.  Uterus is absent.  Stable hypodensities in the liver. Spleen is within normal limits. Pancreas is atrophic. Stable adrenal glands.  Severe scoliosis.  Stable T12 compression deformity.  IMPRESSION: Acute diverticulitis of the sigmoid colon with a small amount of extraluminal bowel gas compatible with microperforation. No abscess. Critical Value/emergent results were called by telephone at the time of interpretation on 08/24/2013 at 6:45 PM to Dr. Ezequiel Essex , who verbally acknowledged these results.   Electronically Signed   By: Maryclare Bean M.D.   On: 08/24/2013 18:46     ROS: See chart Blood pressure 87/52, pulse 69, temperature 99.5 F (37.5 C), temperature source Oral, resp. rate 18, height _0  (1.651 m), weight 67.4 kg (148 lb 9.4 oz), SpO2 94.00%. Physical Exam: Very pleasant white female in no acute distress. Abdomen soft with tenderness noted left lower quadrant to palpation. No rigidity is noted.  Assessment/Plan: Impression: Sigmoid diverticulitis with microperforation, contained. This is her first episode. Hopefully we can treat this with two-week course of antibiotics. I would be more than happy to follow her  as an outpatient. She does not need surgery at this time. Patient understands and agrees to the treatment plan. We'll start sips of clears.  Jamesetta So 08/25/2013, 9:24 AM

## 2013-08-25 NOTE — Progress Notes (Signed)
Patient seen and examined, above note reviewed.  She's been with sigmoid diverticulitis with microperforation. She is on broad-spectrum antibiotics with ciprofloxacin and Flagyl. Abdomen is soft and and tender, especially in the left lower quadrant. Clinically she appears stable. Surgical consult appreciated. We'll continue on clear liquids. Continue current treatments.  [employer]

## 2013-08-25 NOTE — Evaluation (Signed)
Occupational Therapy Evaluation Patient Details Name: Kimberly [name] MRN: [id number] DOB: 1927/01/02 Today's Date: 08/25/2013    History of Present Illness Kimberly [name] is a 80 y.o. female with a past medical history of atrial fibrillation with pacemaker on chronic anticoagulation, arthritis, fibromyalgia, who has had lower abdominal discomfort for the past many months. She is thought to have some kind of urological issue for which she follows up with a urologist. This morning the pain got acutely worse and it moved more to left lower abdomen. It was sharp pain, 10 out of 10 in intensity was associated with nausea without vomiting. Denies any radiation of the pain. No precipitating factor. Pain is aggravated by pressing on the abdomen. Relieved partially with pain medications. Had some dizziness, but denies any syncopal episode. Denies any dysuria or hematuria. She has a history of constipation and strains a lot every time she had a bowel movement. She has a BM only every 2-3 days. She had a bowel movement this morning, but had to strain quite a bit. Denies any blood in the stools or black colored stools. She reports a colonoscopy a few years ago, done at [facility] which she states was apparently normal. Does not know if she's lost any weight. Denies any fever or chills. Denies any vaginal discharge or bleeding. She's never had this problem before.   Clinical Impression   Pt is presenting to acute OT with above circumstances.  Pt remains in 10/10 pain in L lower abdomen.  She has been at a modified independent level with her ADLs and required min assist for IADLs at baseline, and pt will be able to return to this level of functioning with resolution of her current pain.  Pt currently reports no concerns about returning home at d/c.  Pt needs no further OT services at this time.  Acute OT to sign off.     Follow Up Recommendations       Equipment Recommendations       Recommendations for  Other Services       Precautions / Restrictions Precautions Precautions: Fall Restrictions Weight Bearing Restrictions: No      Mobility Bed Mobility                  Transfers                      Balance                                            ADL Overall ADL's : At baseline                                       General ADL Comments: pt currenlty hs increased pain, but once pain is resolved pt will have no difficulties being at baseline functioning.     Vision                     Perception     Praxis      Pertinent Vitals/Pain Pt in 10/10 pain - RN aware.     Hand Dominance Right   Extremity/Trunk Assessment Upper Extremity Assessment Upper Extremity Assessment: Overall WFL for tasks assessed   Lower Extremity Assessment Lower Extremity Assessment: Defer to  PT evaluation       Communication Communication Communication: No difficulties   Cognition Arousal/Alertness: Awake/alert Behavior During Therapy: WFL for tasks assessed/performed Overall Cognitive Status: Within Functional Limits for tasks assessed                     General Comments       Exercises       Shoulder Instructions      Home Living Family/patient expects to be discharged to:: Private residence Living Arrangements: Alone Available Help at Discharge: Family;Friend(s) Type of Home: House Home Access: Stairs to enter [employer] of Steps: 2   [place]: One level     Bathroom Shower/Tub: Occupational psychologist: [place]: Environmental consultant - 2 wheels;Bedside commode;Shower seat;Grab bars - tub/shower;Hand held shower head          Prior Functioning/Environment Level of Independence: Needs assistance  Gait / Transfers Assistance Needed: Pt does not use an assistive device in the home, but does verbalize the use of furniture and door frames as she ambulates. ADL's /  Homemaking Assistance Needed: Pt's great niece drives her to appointments, and pt's neighbor assists with grocery shopping and taking out the trash/recycling. Pt has been modified independent in all other ADLs - standing for cooking tires her, but is is able to complete,.        OT Diagnosis:     OT Problem List:     OT Treatment/Interventions:      OT Goals(Current goals can be found in the care plan section) Acute Rehab OT Goals Patient Stated Goal: No OT goals needed  OT Frequency:     Barriers to D/C:            Co-evaluation              End of Session    Activity Tolerance: Patient tolerated treatment well Patient left: in bed;with call bell/phone within reach   Time: 0912-0941 OT Time Calculation (min): 29 min Charges:  OT General Charges $OT Visit: 1 Procedure OT Evaluation $Initial OT Evaluation Tier I: 1 Procedure G-Codes:      Bea Graff, MS, OTR/L [phone]  08/25/2013, 9:53 AM

## 2013-08-26 DIAGNOSIS — N183 Chronic kidney disease, stage 3 unspecified: Secondary | ICD-10-CM

## 2013-08-26 DIAGNOSIS — K5900 Constipation, unspecified: Secondary | ICD-10-CM | POA: Diagnosis present

## 2013-08-26 LAB — CBC
HEMATOCRIT: 26.1 % — AB (ref 36.0–46.0)
HEMOGLOBIN: 8.4 g/dL — AB (ref 12.0–15.0)
MCH: 30.5 pg (ref 26.0–34.0)
MCHC: 32.2 g/dL (ref 30.0–36.0)
MCV: 94.9 fL (ref 78.0–100.0)
Platelets: 148 10*3/uL — ABNORMAL LOW (ref 150–400)
RBC: 2.75 MIL/uL — AB (ref 3.87–5.11)
RDW: 14.2 % (ref 11.5–15.5)
WBC: 12.9 10*3/uL — AB (ref 4.0–10.5)

## 2013-08-26 LAB — BASIC METABOLIC PANEL
BUN: 17 mg/dL (ref 6–23)
CHLORIDE: 105 meq/L (ref 96–112)
CO2: 22 mEq/L (ref 19–32)
Calcium: 8.3 mg/dL — ABNORMAL LOW (ref 8.4–10.5)
Creatinine, Ser: 1.35 mg/dL — ABNORMAL HIGH (ref 0.50–1.10)
GFR calc Af Amer: 41 mL/min — ABNORMAL LOW (ref >90)
GFR calc non Af Amer: 35 mL/min — ABNORMAL LOW (ref >90)
GLUCOSE: 110 mg/dL — AB (ref 70–99)
Potassium: 3.9 mEq/L (ref 3.7–5.3)
Sodium: 139 mEq/L (ref 137–147)

## 2013-08-26 LAB — RETICULOCYTES
RBC.: 2.99 MIL/uL — ABNORMAL LOW (ref 3.87–5.11)
RETIC CT PCT: 1.3 % (ref 0.4–3.1)
Retic Count, Absolute: 38.9 10*3/uL (ref 19.0–186.0)

## 2013-08-26 LAB — VITAMIN B12: VITAMIN B 12: 240 pg/mL (ref 211–911)

## 2013-08-26 LAB — FERRITIN: Ferritin: 99 ng/mL (ref 10–291)

## 2013-08-26 LAB — FOLATE: Folate: 4.7 ng/mL

## 2013-08-26 MED ORDER — PANTOPRAZOLE SODIUM 40 MG IV SOLR
40.0000 mg | Freq: Two times a day (BID) | INTRAVENOUS | Status: DC
  Administered 2013-08-26 – 2013-08-28 (×5): 40 mg via INTRAVENOUS
  Filled 2013-08-26 (×5): qty 40

## 2013-08-26 MED ORDER — HYDROMORPHONE HCL PF 1 MG/ML IJ SOLN
0.5000 mg | INTRAMUSCULAR | Status: DC | PRN
  Administered 2013-08-26 – 2013-08-27 (×6): 0.5 mg via INTRAVENOUS
  Filled 2013-08-26 (×6): qty 1

## 2013-08-26 MED ORDER — BISACODYL 10 MG RE SUPP: 10.0000 mg | Freq: Every day | RECTAL | Status: DC | PRN

## 2013-08-26 MED ORDER — DOCUSATE SODIUM 100 MG PO CAPS
100.0000 mg | ORAL_CAPSULE | Freq: Two times a day (BID) | ORAL | Status: DC
  Administered 2013-08-26 (×2): 100 mg via ORAL
  Filled 2013-08-26 (×3): qty 1

## 2013-08-26 NOTE — Clinical Documentation Improvement (Signed)
Possible Clinical Conditions?  Acute Blood Loss Anemia Aplastic anemia Precipitous drop in Hematocrit Acute on chronic blood loss anemia Other Condition Cannot Clinically Determine   Supporting Information:  Per 08/25/13 progress note: Anemia: Hg 9.6. Chart review indicates baseline 11-12. May be dilutional. No s/sx bleeding. Will check FOBT and anemia panel.   Thank You, Serena Colonel ,RN Clinical Documentation Specialist:  [place] [employer]

## 2013-08-26 NOTE — Progress Notes (Signed)
Subjective: Still some left lower quadrant abdominal pain, but is complaining more about her clear liquid diet. She has not had a bowel movement yet.  Objective: Vital signs in last 24 hours: Temp:  [98 F (36.7 C)-99.3 F (37.4 C)] 99.3 F (37.4 C) (04/24 0418) Pulse Rate:  [69-106] 69 (04/24 0418) Resp:  [18-20] 20 (04/24 0418) BP: (93-96)/(54-59) 96/59 mmHg (04/24 0418) SpO2:  [93 %-95 %] 95 % (04/24 0418) Last BM Date: 08/24/13  Intake/Output from previous day: 04/23 0701 - 04/24 0700 In: -  Out: 450 [Urine:450] Intake/Output this shift: Total I/O In: 3106.3 [P.O.:240; I.V.:2766.3; IV Piggyback:100] Out: 300 [Urine:300]  General appearance: alert, cooperative, appears stated age and no distress GI: Soft with mild tenderness noted in the left lower quadrant to deep palpation. No hepatosplenomegaly, masses, or rigidity are noted.  Lab Results:   Recent Labs  08/25/13 0639 08/26/13 0619  WBC 12.3* 12.9*  HGB 9.6* 8.4*  HCT 28.3* 26.1*  PLT 161 148*   BMET  Recent Labs  08/25/13 0639 08/26/13 0619  NA 141 139  K 3.9 3.9  CL 104 105  CO2 25 22  GLUCOSE 93 110*  BUN 21 17  CREATININE 1.40* 1.35*  CALCIUM 8.5 8.3*   PT/INR  Recent Labs  08/24/13 1753  LABPROT 15.1  INR 1.22    Studies/Results: Ct Abdomen Pelvis Wo Contrast  08/24/2013   CLINICAL DATA:  Pelvic pain.  EXAM: CT ABDOMEN AND PELVIS WITHOUT CONTRAST  TECHNIQUE: Multidetector CT imaging of the abdomen and pelvis was performed following the standard protocol without IV contrast.  COMPARISON:  CT ABD - PELV W/ CM dated 12/26/2012  FINDINGS: Large hiatal hernia.  Perinephric stranding is symmetrical. No hydronephrosis. No urinary calculus.  There is slight wall thickening of the sigmoid colon and stranding in the adjacent fat. A small bubble of extraluminal bowel gas projects just inferior to the sigmoid colon compatible with micro perforation. Discrepancy from a diverticulum as best appreciated  on coronal reformat images. No abscess.  Bladder is unremarkable.  Uterus is absent.  Stable hypodensities in the liver. Spleen is within normal limits. Pancreas is atrophic. Stable adrenal glands.  Severe scoliosis.  Stable T12 compression deformity.  IMPRESSION: Acute diverticulitis of the sigmoid colon with a small amount of extraluminal bowel gas compatible with microperforation. No abscess. Critical Value/emergent results were called by telephone at the time of interpretation on 08/24/2013 at 6:45 PM to Dr. Ezequiel Essex , who verbally acknowledged these results.   Electronically Signed   By: Maryclare Bean M.D.   On: 08/24/2013 18:46    Anti-infectives: Anti-infectives   Start     Dose/Rate Route Frequency Ordered Stop   08/25/13 2000  ciprofloxacin (CIPRO) IVPB 400 mg     400 mg 200 mL/hr over 60 Minutes Intravenous Every 24 hours 08/24/13 2111     08/25/13 0200  metroNIDAZOLE (FLAGYL) IVPB 500 mg     500 mg 100 mL/hr over 60 Minutes Intravenous Every 8 hours 08/24/13 2111     08/24/13 1900  ciprofloxacin (CIPRO) IVPB 400 mg     400 mg 200 mL/hr over 60 Minutes Intravenous  Once 08/24/13 1846 08/24/13 2120   08/24/13 1900  metroNIDAZOLE (FLAGYL) IVPB 500 mg     500 mg 100 mL/hr over 60 Minutes Intravenous  Once 08/24/13 1846 08/24/13 2022      Assessment/Plan: Impression: Sigmoid diverticulitis with microperforation, resolving Plan: No need for acute surgical intervention. We'll continue to follow with you.  May advance to full liquid diet.  LOS: 2 days    Jamesetta So 08/26/2013

## 2013-08-26 NOTE — Progress Notes (Signed)
Patient seen and examined. Above note reviewed.   She feels that her abdominal pain appears to be improving. She is tolerating clear and full liquids. She is either to advance to solid diet tomorrow. She is continued on intravenous antibiotics. Abdomen continues to feel soft but tender. She did have a bowel movement today. We'll need to continue current treatments. Regarding her anemia, she does not have any evidence of significant bleeding. This may be dilutional. We'll continue to follow.  [employer]

## 2013-08-26 NOTE — Progress Notes (Signed)
[facility] PROGRESS NOTE  Kimberly [name] [id number] DOB: 07-31-1926 DOA: 08/24/2013 PCP: Anthoney Harada, MD  Summary:  This is a 80 year old, Caucasian female, who presents with left abdominal pain, and was found to have acute diverticulitis. Genitourinary exam was done by the ED physician and did not show anything acute. Symptoms are most likely due to the acute inflammation. General surgery following.   Assessment/Plan: #1 acute diverticulitis with microperforation: reports little improvement this am. No abscess noted on CT scan. Max temp 99.3. Leukocytosis stable at 12. Non-toxic appearing. Complaining about clear liquid diet and inability to have BM. Continue Cipro and Flagyl day #3. Appreciate surgery input. Will advance to full liquids. Will provide suppository as well.  Continue pain medications. The key will be to avoid constipation and straining in the future. She will need to be discharged on a good bowel regimen. TSH 2.1. Will defer further endoscopies to her primary care physician.  #2 history of atrial fibrillation with pacemaker: Heart rate controlled. Continue with her anticoagulation. Continue with beta blocker with parameters given somewhat soft BP.  #3 history of chronic kidney disease, stage III: Creatinine, remains at baseline. Monitor urine output.  #4. HTN. Remains somewhat soft.  Home meds include lasix and coreg. Continuing coreg with parameters and holding lasix. Monitor closely  #5. Anemia: Hg 8.4. Chart review indicates baseline 11-12. Likely multifactorial i.e. Dilutional, acute illness. No clinical s/sx bleeding.  Will check FOBT and anemia panel. Will decrease IV fluid rate as she is taking po fluids fairly well. Chart review does indicate hx upper GI bleed related to ulcerative reflux esophagitis. EGD 6/14. Will start protonix. Will monitor. If no improvement in am consider further workup.  #6. Leukocytosis. Related to #1. Stable at 12. . Max temp 99.3.  Non-toxic appearing.  #7. Constipation. Somewhat chronic in nature. Colace BID and suppository daily prn.  Code Status: full Family Communication: none present Disposition Plan: home when ready   Consultants:  General surgery  Procedures:  none  Antibiotics: Cipro 08/24/13>>>  Flagyl 08/24/13>>>   HPI/Subjective: Sitting up in bed drinking juice. Complaining about clear liquid diet. Reports pain "a little" better. Complains constipation.   Objective: Filed Vitals:   08/26/13 0418  BP: 96/59  Pulse: 69  Temp: 99.3 F (37.4 C)  Resp: 20    Intake/Output Summary (Last 24 hours) at 08/26/13 1000 Last data filed at 08/26/13 [id number]  Gross per 24 hour  Intake      0 ml  Output    450 ml  Net   -450 ml   Filed Weights   08/24/13 1722 08/24/13 2058  Weight: 63.504 kg (140 lb) 67.4 kg (148 lb 9.4 oz)    Exam:   General:  Well nourished but cranky  Cardiovascular: RRR No MGR No LE edema PPP  Respiratory: normal effort BS clear bilaterally no rhonchi or wheeze  Abdomen: non-distended and soft +BS diffuse tenderness particularly lower quadrants  Musculoskeletal: no clubbing and cyanosis   Data Reviewed: Basic Metabolic Panel:  Recent Labs Lab 08/24/13 1753 08/25/13 0639 08/26/13 0619  NA 140 141 139  K 3.2* 3.9 3.9  CL 98 104 105  CO2 28 25 22   GLUCOSE 112* 93 110*  BUN 21 21 17   CREATININE 1.33* 1.40* 1.35*  CALCIUM 9.2 8.5 8.3*   Liver Function Tests:  Recent Labs Lab 08/24/13 1753 08/25/13 0639  AST 15 14  ALT 7 5  ALKPHOS 88 68  BILITOT 0.3 0.5  PROT 7.4 5.9*  ALBUMIN 3.5 2.7*    Recent Labs Lab 08/24/13 1753  LIPASE 29   No results found for this basename: AMMONIA,  in the last 168 hours CBC:  Recent Labs Lab 08/24/13 1753 08/25/13 0639 08/26/13 0619  WBC 12.8* 12.3* 12.9*  NEUTROABS 11.0*  --   --   HGB 11.6* 9.6* 8.4*  HCT 35.4* 28.3* 26.1*  MCV 93.7 93.4 94.9  PLT 231 161 148*   Cardiac Enzymes: No results found for  this basename: CKTOTAL, CKMB, CKMBINDEX, TROPONINI,  in the last 168 hours BNP (last 3 results) No results found for this basename: PROBNP,  in the last 8760 hours CBG: No results found for this basename: GLUCAP,  in the last 168 hours  No results found for this or any previous visit (from the past 240 hour(s)).   Studies: Ct Abdomen Pelvis Wo Contrast  08/24/2013   CLINICAL DATA:  Pelvic pain.  EXAM: CT ABDOMEN AND PELVIS WITHOUT CONTRAST  TECHNIQUE: Multidetector CT imaging of the abdomen and pelvis was performed following the standard protocol without IV contrast.  COMPARISON:  CT ABD - PELV W/ CM dated 12/26/2012  FINDINGS: Large hiatal hernia.  Perinephric stranding is symmetrical. No hydronephrosis. No urinary calculus.  There is slight wall thickening of the sigmoid colon and stranding in the adjacent fat. A small bubble of extraluminal bowel gas projects just inferior to the sigmoid colon compatible with micro perforation. Discrepancy from a diverticulum as best appreciated on coronal reformat images. No abscess.  Bladder is unremarkable.  Uterus is absent.  Stable hypodensities in the liver. Spleen is within normal limits. Pancreas is atrophic. Stable adrenal glands.  Severe scoliosis.  Stable T12 compression deformity.  IMPRESSION: Acute diverticulitis of the sigmoid colon with a small amount of extraluminal bowel gas compatible with microperforation. No abscess. Critical Value/emergent results were called by telephone at the time of interpretation on 08/24/2013 at 6:45 PM to Dr. Ezequiel Essex , who verbally acknowledged these results.   Electronically Signed   By: Maryclare Bean M.D.   On: 08/24/2013 18:46    Scheduled Meds: . apixaban  2.5 mg Oral BID  . carvedilol  6.25 mg Oral BID WC  . ciprofloxacin  400 mg Intravenous Q24H  . docusate sodium  100 mg Oral BID  . metronidazole  500 mg Intravenous Q8H  . pantoprazole (PROTONIX) IV  40 mg Intravenous Q12H   Continuous Infusions: . sodium  chloride 75 mL/hr at 08/24/13 2137    Principal Problem:   Acute diverticulitis Active Problems:   Atrial fibrillation   Pacemaker   Fibromyalgia   Hypertension   CKD (chronic kidney disease) stage 3, GFR 30-59 ml/min   Chronic anticoagulation   Leukocytosis, unspecified   Anemia    Time spent: 35 minutes    [facility] Pager [phone]. If 7PM-7AM, please contact night-coverage at [email or web address], password [facility] 08/26/2013, 10:00 AM  LOS: 2 days

## 2013-08-27 LAB — CBC
HCT: 24.1 % — ABNORMAL LOW (ref 36.0–46.0)
Hemoglobin: 8.2 g/dL — ABNORMAL LOW (ref 12.0–15.0)
MCH: 31.2 pg (ref 26.0–34.0)
MCHC: 34 g/dL (ref 30.0–36.0)
MCV: 91.6 fL (ref 78.0–100.0)
PLATELETS: 150 10*3/uL (ref 150–400)
RBC: 2.63 MIL/uL — AB (ref 3.87–5.11)
RDW: 14.7 % (ref 11.5–15.5)
WBC: 11.1 10*3/uL — ABNORMAL HIGH (ref 4.0–10.5)

## 2013-08-27 LAB — BASIC METABOLIC PANEL
BUN: 17 mg/dL (ref 6–23)
CALCIUM: 8.2 mg/dL — AB (ref 8.4–10.5)
CO2: 22 mEq/L (ref 19–32)
CREATININE: 1.43 mg/dL — AB (ref 0.50–1.10)
Chloride: 105 mEq/L (ref 96–112)
GFR calc Af Amer: 38 mL/min — ABNORMAL LOW (ref >90)
GFR calc non Af Amer: 33 mL/min — ABNORMAL LOW (ref >90)
Glucose, Bld: 107 mg/dL — ABNORMAL HIGH (ref 70–99)
Potassium: 3.8 mEq/L (ref 3.7–5.3)
Sodium: 138 mEq/L (ref 137–147)

## 2013-08-27 LAB — IRON AND TIBC
Iron: 14 ug/dL — ABNORMAL LOW (ref 42–135)
SATURATION RATIOS: 4 % — AB (ref 20–55)
TIBC: 342 ug/dL (ref 250–470)
UIBC: 328 ug/dL (ref 125–400)

## 2013-08-27 MED ORDER — OXYCODONE-ACETAMINOPHEN 5-325 MG PO TABS
1.0000 | ORAL_TABLET | ORAL | Status: DC | PRN
  Administered 2013-08-27: 2 via ORAL
  Administered 2013-08-27 – 2013-08-28 (×2): 1 via ORAL
  Administered 2013-08-28: 2 via ORAL
  Administered 2013-08-28: 1 via ORAL
  Filled 2013-08-27: qty 1
  Filled 2013-08-27 (×2): qty 2
  Filled 2013-08-27: qty 1
  Filled 2013-08-27: qty 2
  Filled 2013-08-27: qty 1

## 2013-08-27 MED ORDER — CARVEDILOL 3.125 MG PO TABS
3.1250 mg | ORAL_TABLET | Freq: Two times a day (BID) | ORAL | Status: DC
  Administered 2013-08-27 – 2013-08-28 (×2): 3.125 mg via ORAL
  Filled 2013-08-27 (×2): qty 1

## 2013-08-27 NOTE — Progress Notes (Signed)
[facility] PROGRESS NOTE  Kimberly [name] [id number] DOB: Mar 30, 1927 DOA: 08/24/2013 PCP: Anthoney Harada, MD  Summary:  This is a 80 year old, Caucasian female, who presents with left abdominal pain, and was found to have acute diverticulitis. Genitourinary exam was done by the ED physician and did not show anything acute. Symptoms are most likely due to the acute inflammation. General surgery following.   Assessment/Plan: #1 acute diverticulitis with microperforation: appears to be slowly improving. Continue on intravenous antibiotics. Appears to be tolerating diet. Appreciate surgery assistance. Possibly home tomorrow if continued improvement.  #2 history of atrial fibrillation with pacemaker: Heart rate controlled. Continue with her anticoagulation. Continue with beta blocker with parameters given somewhat soft BP.  #3 history of chronic kidney disease, stage III: Creatinine, remains at baseline. Monitor urine output.  #4. HTN. Remains somewhat soft.  Home meds include lasix and coreg. Continuing coreg with parameters and holding lasix. Monitor closely  #5. Anemia: Hemoglobin has stabilized.  Decreased in hemoglobin may be dilutional/related to acute illness. She does not have any obvious sources of bleeding.  Serum iron is low indicating some degree of iron deficiency anemia. May benefit from iron supplements on discharge.  Iron deficiency can be further worked up as an outpatient. #6. Leukocytosis. Related to #1. Improving. #7. Constipation. Somewhat chronic in nature. Colace BID and suppository daily prn.   Code Status: full Family Communication: none present Disposition Plan: home when ready   Consultants:  General surgery  Procedures:  none  Antibiotics: Cipro 08/24/13>>>  Flagyl 08/24/13>>>   HPI/Subjective: Feeling better.  Moving bowels.  Tolerating diet.  Still has some abdominal discomfort.  No vomiting.   Objective: Filed Vitals:   08/27/13 1419  BP:  113/53  Pulse: 70  Temp: 97.6 F (36.4 C)  Resp: 20    Intake/Output Summary (Last 24 hours) at 08/27/13 1620 Last data filed at 08/27/13 1300  Gross per 24 hour  Intake 845.58 ml  Output    200 ml  Net 645.58 ml   Filed Weights   08/24/13 1722 08/24/13 2058  Weight: 63.504 kg (140 lb) 67.4 kg (148 lb 9.4 oz)    Exam:   General:  Well nourished, no acute distress  Cardiovascular: RRR No MGR No LE edema PPP  Respiratory: normal effort BS clear bilaterally no rhonchi or wheeze  Abdomen: non-distended and soft +BS diffuse tenderness particularly lower quadrants  Musculoskeletal: no clubbing and cyanosis   Data Reviewed: Basic Metabolic Panel:  Recent Labs Lab 08/24/13 1753 08/25/13 0639 08/26/13 0619 08/27/13 0628  NA 140 141 139 138  K 3.2* 3.9 3.9 3.8  CL 98 104 105 105  CO2 28 25 22 22   GLUCOSE 112* 93 110* 107*  BUN 21 21 17 17   CREATININE 1.33* 1.40* 1.35* 1.43*  CALCIUM 9.2 8.5 8.3* 8.2*   Liver Function Tests:  Recent Labs Lab 08/24/13 1753 08/25/13 0639  AST 15 14  ALT 7 5  ALKPHOS 88 68  BILITOT 0.3 0.5  PROT 7.4 5.9*  ALBUMIN 3.5 2.7*    Recent Labs Lab 08/24/13 1753  LIPASE 29   No results found for this basename: AMMONIA,  in the last 168 hours CBC:  Recent Labs Lab 08/24/13 1753 08/25/13 0639 08/26/13 0619 08/27/13 0628  WBC 12.8* 12.3* 12.9* 11.1*  NEUTROABS 11.0*  --   --   --   HGB 11.6* 9.6* 8.4* 8.2*  HCT 35.4* 28.3* 26.1* 24.1*  MCV 93.7 93.4 94.9 91.6  PLT 231 161  148* 150   Cardiac Enzymes: No results found for this basename: CKTOTAL, CKMB, CKMBINDEX, TROPONINI,  in the last 168 hours BNP (last 3 results) No results found for this basename: PROBNP,  in the last 8760 hours CBG: No results found for this basename: GLUCAP,  in the last 168 hours  No results found for this or any previous visit (from the past 240 hour(s)).   Studies: No results found.  Scheduled Meds: . apixaban  2.5 mg Oral BID  .  carvedilol  3.125 mg Oral BID WC  . ciprofloxacin  400 mg Intravenous Q24H  . metronidazole  500 mg Intravenous Q8H  . pantoprazole (PROTONIX) IV  40 mg Intravenous Q12H   Continuous Infusions: . sodium chloride 10 mL/hr (08/26/13 1031)    Principal Problem:   Acute diverticulitis Active Problems:   Atrial fibrillation   Pacemaker   Fibromyalgia   Hypertension   CKD (chronic kidney disease) stage 3, GFR 30-59 ml/min   Chronic anticoagulation   Leukocytosis, unspecified   Anemia   Unspecified constipation    Time spent: 35 minutes    [facility] Pager [phone]. If 7PM-7AM, please contact night-coverage at [email or web address], password [facility] 08/27/2013, 4:20 PM  LOS: 3 days

## 2013-08-27 NOTE — Progress Notes (Signed)
  Subjective: Has had multiple bowel movements yesterday. Abdominal pain somewhat improved. No nausea or vomiting. Tolerating full liquid diet well.  Objective: Vital signs in last 24 hours: Temp:  [97.9 F (36.6 C)-98.5 F (36.9 C)] 98.5 F (36.9 C) (04/25 0441) Pulse Rate:  [71-76] 76 (04/25 0441) Resp:  [20] 20 (04/25 0441) BP: (95-122)/(42-58) 103/42 mmHg (04/25 0441) SpO2:  [96 %-98 %] 98 % (04/25 0441) Last BM Date: 08/26/13  Intake/Output from previous day: 04/24 0701 - 04/25 0700 In: 3591.8 [P.O.:540; I.V.:2851.8; IV Piggyback:200] Out: 550 [Urine:550] Intake/Output this shift:    General appearance: alert, cooperative and no distress GI: Soft, minimal tenderness in left lower quadrant to palpation. Some distention noted. No rigidity noted. Active bowel sounds appreciated.  Lab Results:   Recent Labs  08/26/13 0619 08/27/13 0628  WBC 12.9* 11.1*  HGB 8.4* 8.2*  HCT 26.1* 24.1*  PLT 148* 150   BMET  Recent Labs  08/26/13 0619 08/27/13 0628  NA 139 138  K 3.9 3.8  CL 105 105  CO2 22 22  GLUCOSE 110* 107*  BUN 17 17  CREATININE 1.35* 1.43*  CALCIUM 8.3* 8.2*   PT/INR  Recent Labs  08/24/13 1753  LABPROT 15.1  INR 1.22    Studies/Results: No results found.  Anti-infectives: Anti-infectives   Start     Dose/Rate Route Frequency Ordered Stop   08/25/13 2000  ciprofloxacin (CIPRO) IVPB 400 mg     400 mg 200 mL/hr over 60 Minutes Intravenous Every 24 hours 08/24/13 2111     08/25/13 0200  metroNIDAZOLE (FLAGYL) IVPB 500 mg     500 mg 100 mL/hr over 60 Minutes Intravenous Every 8 hours 08/24/13 2111     08/24/13 1900  ciprofloxacin (CIPRO) IVPB 400 mg     400 mg 200 mL/hr over 60 Minutes Intravenous  Once 08/24/13 1846 08/24/13 2120   08/24/13 1900  metroNIDAZOLE (FLAGYL) IVPB 500 mg     500 mg 100 mL/hr over 60 Minutes Intravenous  Once 08/24/13 1846 08/24/13 2022      Assessment/Plan: Impression: Sigmoid diverticulitis, resolving.  Bowel function has returned. Plan: Will advance to regular diet. Further management is pending medicine. No need for acute surgical intervention.  LOS: 3 days    Jamesetta So 08/27/2013

## 2013-08-27 NOTE — Discharge Instructions (Signed)
Diverticulitis °A diverticulum is a small pouch or sac on the colon. Diverticulosis is the presence of these diverticula on the colon. Diverticulitis is the irritation (inflammation) or infection of diverticula. °CAUSES  °The colon and its diverticula contain bacteria. If food particles block the tiny opening to a diverticulum, the bacteria inside can grow and cause an increase in pressure. This leads to infection and inflammation and is called diverticulitis. °SYMPTOMS  °· Abdominal pain and tenderness. Usually, the pain is located on the left side of your abdomen. However, it could be located elsewhere. °· Fever. °· Bloating. °· Feeling sick to your stomach (nausea). °· Throwing up (vomiting). °· Abnormal stools. °DIAGNOSIS  °Your caregiver will take a history and perform a physical exam. Since many things can cause abdominal pain, other tests may be necessary. Tests may include: °· Blood tests. °· Urine tests. °· X-ray of the abdomen. °· CT scan of the abdomen. °Sometimes, surgery is needed to determine if diverticulitis or other conditions are causing your symptoms. °TREATMENT  °Most of the time, you can be treated without surgery. Treatment includes: °· Resting the bowels by only having liquids for a few days. As you improve, you will need to eat a low-fiber diet. °· Intravenous (IV) fluids if you are losing body fluids (dehydrated). °· Antibiotic medicines that treat infections may be given. °· Pain and nausea medicine, if needed. °· Surgery if the inflamed diverticulum has burst. °HOME CARE INSTRUCTIONS  °· Try a clear liquid diet (broth, tea, or water for as long as directed by your caregiver). You may then gradually begin a low-fiber diet as tolerated.  °A low-fiber diet is a diet with less than 10 grams of fiber. Choose the foods below to reduce fiber in the diet: °· White breads, cereals, rice, and pasta. °· Cooked fruits and vegetables or soft fresh fruits and vegetables without the skin. °· Ground or  well-cooked tender beef, ham, veal, lamb, pork, or poultry. °· Eggs and seafood. °· After your diverticulitis symptoms have improved, your caregiver may put you on a high-fiber diet. A high-fiber diet includes 14 grams of fiber for every 1000 calories consumed. For a standard 2000 calorie diet, you would need 28 grams of fiber. Follow these diet guidelines to help you increase the fiber in your diet. It is important to slowly increase the amount fiber in your diet to avoid gas, constipation, and bloating. °· Choose whole-grain breads, cereals, pasta, and brown rice. °· Choose fresh fruits and vegetables with the skin on. Do not overcook vegetables because the more vegetables are cooked, the more fiber is lost. °· Choose more nuts, seeds, legumes, dried peas, beans, and lentils. °· Look for food products that have greater than 3 grams of fiber per serving on the Nutrition Facts label. °· Take all medicine as directed by your caregiver. °· If your caregiver has given you a follow-up appointment, it is very important that you go. Not going could result in lasting (chronic) or permanent injury, pain, and disability. If there is any problem keeping the appointment, call to reschedule. °SEEK MEDICAL CARE IF:  °· Your pain does not improve. °· You have a hard time advancing your diet beyond clear liquids. °· Your bowel movements do not return to normal. °SEEK IMMEDIATE MEDICAL CARE IF:  °· Your pain becomes worse. °· You have an oral temperature above 102° F (38.9° C), not controlled by medicine. °· You have repeated vomiting. °· You have bloody or black, tarry stools. °·   Symptoms that brought you to your caregiver become worse or are not getting better. °MAKE SURE YOU:  °· Understand these instructions. °· Will watch your condition. °· Will get help right away if you are not doing well or get worse. °Document Released: 01/29/2005 Document Revised: 07/14/2011 Document Reviewed: 05/27/2010 °[employer]® Patient Information  ©2014 [employer]. ° °

## 2013-08-28 LAB — CBC
HCT: 23.7 % — ABNORMAL LOW (ref 36.0–46.0)
Hemoglobin: 8.2 g/dL — ABNORMAL LOW (ref 12.0–15.0)
MCH: 31.7 pg (ref 26.0–34.0)
MCHC: 34.6 g/dL (ref 30.0–36.0)
MCV: 91.5 fL (ref 78.0–100.0)
PLATELETS: 179 10*3/uL (ref 150–400)
RBC: 2.59 MIL/uL — ABNORMAL LOW (ref 3.87–5.11)
RDW: 14.6 % (ref 11.5–15.5)
WBC: 7.6 10*3/uL (ref 4.0–10.5)

## 2013-08-28 MED ORDER — POLYETHYLENE GLYCOL 3350 17 G PO PACK: 17.0000 g | PACK | Freq: Every day | ORAL | Status: DC | PRN

## 2013-08-28 MED ORDER — CIPROFLOXACIN HCL 500 MG PO TABS: 500.0000 mg | ORAL_TABLET | Freq: Two times a day (BID) | ORAL | Status: DC

## 2013-08-28 MED ORDER — METRONIDAZOLE 500 MG PO TABS: 500.0000 mg | ORAL_TABLET | Freq: Three times a day (TID) | ORAL | Status: DC

## 2013-08-28 MED ORDER — HYDROCODONE-ACETAMINOPHEN 5-325 MG PO TABS: 1.0000 | ORAL_TABLET | Freq: Four times a day (QID) | ORAL | Status: DC | PRN

## 2013-08-28 NOTE — Progress Notes (Signed)
Patient discharged home.  IV removed - WNL.  Instructed on medications, verbalizes understanding.  Encouraged to drink plenty of fluids to prevent dehydration and constipation.  No questions at this time.  Stable to DC home  -  Left floor via WC with NT assist

## 2013-08-28 NOTE — Discharge Summary (Signed)
Physician Discharge Summary  Kimberly [name] [id number] DOB: [birth year] DOA: 08/24/2013  PCP: Anthoney Harada, MD  Admit date: 08/24/2013 Discharge date: 08/28/2013  Time spent: 40 minutes  Recommendations for Outpatient Follow-up:  1. Patient will follow up with Dr. Arnoldo Morale on discharge 2. Followup primary care physician wants to make  Discharge Diagnoses:  Principal Problem:   Acute diverticulitis Active Problems:   Atrial fibrillation   Pacemaker   Fibromyalgia   Hypertension   CKD (chronic kidney disease) stage 3, GFR 30-59 ml/min   Chronic anticoagulation   Leukocytosis, unspecified   Anemia   Unspecified constipation   Discharge Condition: improved  Diet recommendation: low salt, high fiber  Filed Weights   08/24/13 1722 08/24/13 2058  Weight: 63.504 kg (140 lb) 67.4 kg (148 lb 9.4 oz)    History of present illness:  Kimberly [name] is a 80 y.o. female with a past medical history of atrial fibrillation with pacemaker on chronic anticoagulation, arthritis, fibromyalgia, who has had lower abdominal discomfort for the past many months. She is thought to have some kind of urological issue for which she follows up with a urologist. This morning the pain got acutely worse and it moved more to left lower abdomen. It was sharp pain, 10 out of 10 in intensity was associated with nausea without vomiting. Denies any radiation of the pain. No precipitating factor. Pain is aggravated by pressing on the abdomen. Relieved partially with pain medications. Had some dizziness, but denies any syncopal episode. Denies any dysuria or hematuria. She has a history of constipation and strains a lot every time she had a bowel movement. She has a BM only every 2-3 days. She had a bowel movement this morning, but had to strain quite a bit. Denies any blood in the stools or black colored stools. She reports a colonoscopy a few years ago, done at [facility] which she states was apparently  normal. Does not know if she's lost any weight. Denies any fever or chills. Denies any vaginal discharge or bleeding. She's never had this problem before.   Hospital Course:  This patient was admitted to the hospital with abdominal pain. She was found to have sigmoid diverticulitis with microperforation. She was started on intravenous antibiotics with ciprofloxacin and Flagyl. Pain was controlled with intravenous pain medications. Her diet was slowly advanced to solid food and she tolerated this without difficulty. The patient has not had any vomiting. Her pain appears to be reasonably controlled. She is having bowel movements with the assistance of stool softeners. She is recommended that once her acute episode of diverticulitis has resolved, she should be continued on a high-fiber diet. She will follow up with general surgery and the outpatient setting. The remainder of her medical issues have remained stable.  Procedures:    Consultations:  General surgery  Discharge Exam: Filed Vitals:   08/28/13 1317  BP: 120/63  Pulse: 71  Temp: 97.8 F (36.6 C)  Resp: 20    General: NAD Cardiovascular: S1, S2 RRR Respiratory: CTA B  Discharge Instructions You were cared for by a hospitalist during your hospital stay. If you have any questions about your discharge medications or the care you received while you were in the hospital after you are discharged, you can call the unit and asked to speak with the hospitalist on call if the hospitalist that took care of you is not available. Once you are discharged, your primary care physician will handle any further medical issues. Please note  that NO REFILLS for any discharge medications will be authorized once you are discharged, as it is imperative that you return to your primary care physician (or establish a relationship with a primary care physician if you do not have one) for your aftercare needs so that they can reassess your need for medications  and monitor your lab values.  Discharge Orders   Future Appointments Provider Department Dept Phone   09/12/2013 11:30 AM Sanda Klein, MD [facility] [phone]   Future Orders Complete By Expires   Call MD for:  persistant nausea and vomiting  As directed    Call MD for:  severe uncontrolled pain  As directed    Call MD for:  temperature >100.4  As directed    Diet - low sodium heart healthy  As directed    Increase activity slowly  As directed        Medication List         carvedilol 6.25 MG tablet  Commonly known as:  COREG  Take 1 tablet (6.25 mg total) by mouth 2 (two) times daily with a meal.     ciprofloxacin 500 MG tablet  Commonly known as:  CIPRO  Take 1 tablet (500 mg total) by mouth 2 (two) times daily.     Doxepin HCl 3 MG Tabs  Take 1 tablet by mouth at bedtime.     ELIQUIS 2.5 MG Tabs tablet  Generic drug:  apixaban  Take 2.5 mg by mouth 2 (two) times daily.     furosemide 20 MG tablet  Commonly known as:  LASIX  Take 40 mg by mouth daily.     HYDROcodone-acetaminophen 5-325 MG per tablet  Commonly known as:  NORCO/VICODIN  Take 1-2 tablets by mouth every 6 (six) hours as needed for moderate pain.     metroNIDAZOLE 500 MG tablet  Commonly known as:  FLAGYL  Take 1 tablet (500 mg total) by mouth 3 (three) times daily.     omeprazole 20 MG capsule  Commonly known as:  PRILOSEC  Take 20 mg by mouth daily.     polyethylene glycol packet  Commonly known as:  MIRALAX  Take 17 g by mouth daily as needed.       Allergies  Allergen Reactions  . Iodine Rash and Other (See Comments)    REACTION:If injected,  Rash/irritated skin reaction "welts"  . Penicillins Hives  . Sulfa Antibiotics Rash       Follow-up Information   Follow up with Jamesetta So, MD. Schedule an appointment as soon as possible for a visit on 09/13/2013.   Specialty:  General Surgery   Contact information:   [address] [place]  [address] [phone]       Follow up with Anthoney Harada, MD. Schedule an appointment as soon as possible for a visit in 2 weeks.   Specialty:  Family Medicine   Contact information:   [name] [address] [phone]        The results of significant diagnostics from this hospitalization (including imaging, microbiology, ancillary and laboratory) are listed below for reference.    Significant Diagnostic Studies: Ct Abdomen Pelvis Wo Contrast  08/24/2013   CLINICAL DATA:  Pelvic pain.  EXAM: CT ABDOMEN AND PELVIS WITHOUT CONTRAST  TECHNIQUE: Multidetector CT imaging of the abdomen and pelvis was performed following the standard protocol without IV contrast.  COMPARISON:  CT ABD - PELV W/ CM dated 12/26/2012  FINDINGS: Large hiatal hernia.  Perinephric stranding is symmetrical. No hydronephrosis. No urinary calculus.  There is slight wall thickening of the sigmoid colon and stranding in the adjacent fat. A small bubble of extraluminal bowel gas projects just inferior to the sigmoid colon compatible with micro perforation. Discrepancy from a diverticulum as best appreciated on coronal reformat images. No abscess.  Bladder is unremarkable.  Uterus is absent.  Stable hypodensities in the liver. Spleen is within normal limits. Pancreas is atrophic. Stable adrenal glands.  Severe scoliosis.  Stable T12 compression deformity.  IMPRESSION: Acute diverticulitis of the sigmoid colon with a small amount of extraluminal bowel gas compatible with microperforation. No abscess. Critical Value/emergent results were called by telephone at the time of interpretation on 08/24/2013 at 6:45 PM to Dr. Ezequiel Essex , who verbally acknowledged these results.   Electronically Signed   By: Maryclare Bean M.D.   On: 08/24/2013 18:46    Microbiology: No results found for this or any previous visit (from the past 240 hour(s)).   Labs: Basic Metabolic Panel:  Recent Labs Lab 08/24/13 1753  08/25/13 0639 08/26/13 0619 08/27/13 0628  NA 140 141 139 138  K 3.2* 3.9 3.9 3.8  CL 98 104 105 105  CO2 28 25 22 22   GLUCOSE 112* 93 110* 107*  BUN 21 21 17 17   CREATININE 1.33* 1.40* 1.35* 1.43*  CALCIUM 9.2 8.5 8.3* 8.2*   Liver Function Tests:  Recent Labs Lab 08/24/13 1753 08/25/13 0639  AST 15 14  ALT 7 5  ALKPHOS 88 68  BILITOT 0.3 0.5  PROT 7.4 5.9*  ALBUMIN 3.5 2.7*    Recent Labs Lab 08/24/13 1753  LIPASE 29   No results found for this basename: AMMONIA,  in the last 168 hours CBC:  Recent Labs Lab 08/24/13 1753 08/25/13 0639 08/26/13 0619 08/27/13 0628 08/28/13 0634  WBC 12.8* 12.3* 12.9* 11.1* 7.6  NEUTROABS 11.0*  --   --   --   --   HGB 11.6* 9.6* 8.4* 8.2* 8.2*  HCT 35.4* 28.3* 26.1* 24.1* 23.7*  MCV 93.7 93.4 94.9 91.6 91.5  PLT 231 161 148* 150 179   Cardiac Enzymes: No results found for this basename: CKTOTAL, CKMB, CKMBINDEX, TROPONINI,  in the last 168 hours BNP: BNP (last 3 results) No results found for this basename: PROBNP,  in the last 8760 hours CBG: No results found for this basename: GLUCAP,  in the last 168 hours     Signed:  Kathie Dike  [facility] 08/28/2013, 7:37 PM

## 2013-08-31 ENCOUNTER — Other Ambulatory Visit: Payer: Self-pay | Admitting: Cardiovascular Disease

## 2013-08-31 NOTE — Telephone Encounter (Signed)
Rx was sent to pharmacy electronically. 

## 2013-09-05 ENCOUNTER — Encounter (HOSPITAL_COMMUNITY): Payer: Self-pay | Admitting: Emergency Medicine

## 2013-09-05 ENCOUNTER — Inpatient Hospital Stay (HOSPITAL_COMMUNITY)
Admission: EM | Admit: 2013-09-05 | Discharge: 2013-09-16 | DRG: 553 | Disposition: A | Discharge status: Skilled Nursing Facility | Payer: Medicare Other | Attending: Family Medicine | Admitting: Family Medicine

## 2013-09-05 DIAGNOSIS — R262 Difficulty in walking, not elsewhere classified: Secondary | ICD-10-CM | POA: Diagnosis present

## 2013-09-05 DIAGNOSIS — F3289 Other specified depressive episodes: Secondary | ICD-10-CM | POA: Diagnosis present

## 2013-09-05 DIAGNOSIS — K5900 Constipation, unspecified: Secondary | ICD-10-CM

## 2013-09-05 DIAGNOSIS — R079 Chest pain, unspecified: Secondary | ICD-10-CM

## 2013-09-05 DIAGNOSIS — K5792 Diverticulitis of intestine, part unspecified, without perforation or abscess without bleeding: Secondary | ICD-10-CM | POA: Diagnosis present

## 2013-09-05 DIAGNOSIS — N183 Chronic kidney disease, stage 3 unspecified: Secondary | ICD-10-CM

## 2013-09-05 DIAGNOSIS — R197 Diarrhea, unspecified: Secondary | ICD-10-CM

## 2013-09-05 DIAGNOSIS — K630 Abscess of intestine: Secondary | ICD-10-CM | POA: Diagnosis present

## 2013-09-05 DIAGNOSIS — Z87891 Personal history of nicotine dependence: Secondary | ICD-10-CM

## 2013-09-05 DIAGNOSIS — Z7901 Long term (current) use of anticoagulants: Secondary | ICD-10-CM

## 2013-09-05 DIAGNOSIS — I5032 Chronic diastolic (congestive) heart failure: Secondary | ICD-10-CM | POA: Diagnosis present

## 2013-09-05 DIAGNOSIS — D72829 Elevated white blood cell count, unspecified: Secondary | ICD-10-CM | POA: Diagnosis present

## 2013-09-05 DIAGNOSIS — R42 Dizziness and giddiness: Secondary | ICD-10-CM

## 2013-09-05 DIAGNOSIS — IMO0001 Reserved for inherently not codable concepts without codable children: Secondary | ICD-10-CM | POA: Diagnosis present

## 2013-09-05 DIAGNOSIS — K219 Gastro-esophageal reflux disease without esophagitis: Secondary | ICD-10-CM | POA: Diagnosis present

## 2013-09-05 DIAGNOSIS — F32A Depression, unspecified: Secondary | ICD-10-CM

## 2013-09-05 DIAGNOSIS — D62 Acute posthemorrhagic anemia: Secondary | ICD-10-CM

## 2013-09-05 DIAGNOSIS — K449 Diaphragmatic hernia without obstruction or gangrene: Secondary | ICD-10-CM | POA: Diagnosis present

## 2013-09-05 DIAGNOSIS — K5720 Diverticulitis of large intestine with perforation and abscess without bleeding: Secondary | ICD-10-CM

## 2013-09-05 DIAGNOSIS — G589 Mononeuropathy, unspecified: Secondary | ICD-10-CM | POA: Diagnosis present

## 2013-09-05 DIAGNOSIS — Z950 Presence of cardiac pacemaker: Secondary | ICD-10-CM

## 2013-09-05 DIAGNOSIS — E876 Hypokalemia: Secondary | ICD-10-CM | POA: Diagnosis present

## 2013-09-05 DIAGNOSIS — I509 Heart failure, unspecified: Secondary | ICD-10-CM | POA: Diagnosis present

## 2013-09-05 DIAGNOSIS — G4700 Insomnia, unspecified: Secondary | ICD-10-CM | POA: Diagnosis present

## 2013-09-05 DIAGNOSIS — D649 Anemia, unspecified: Secondary | ICD-10-CM | POA: Diagnosis present

## 2013-09-05 DIAGNOSIS — M1990 Unspecified osteoarthritis, unspecified site: Secondary | ICD-10-CM

## 2013-09-05 DIAGNOSIS — K5732 Diverticulitis of large intestine without perforation or abscess without bleeding: Secondary | ICD-10-CM | POA: Diagnosis present

## 2013-09-05 DIAGNOSIS — I4821 Permanent atrial fibrillation: Secondary | ICD-10-CM | POA: Diagnosis present

## 2013-09-05 DIAGNOSIS — M109 Gout, unspecified: Principal | ICD-10-CM | POA: Diagnosis present

## 2013-09-05 DIAGNOSIS — I4891 Unspecified atrial fibrillation: Secondary | ICD-10-CM | POA: Diagnosis present

## 2013-09-05 DIAGNOSIS — M4120 Other idiopathic scoliosis, site unspecified: Secondary | ICD-10-CM | POA: Diagnosis present

## 2013-09-05 DIAGNOSIS — B3789 Other sites of candidiasis: Secondary | ICD-10-CM | POA: Diagnosis present

## 2013-09-05 DIAGNOSIS — M797 Fibromyalgia: Secondary | ICD-10-CM

## 2013-09-05 DIAGNOSIS — K2270 Barrett's esophagus without dysplasia: Secondary | ICD-10-CM | POA: Diagnosis present

## 2013-09-05 DIAGNOSIS — I10 Essential (primary) hypertension: Secondary | ICD-10-CM | POA: Diagnosis present

## 2013-09-05 DIAGNOSIS — I428 Other cardiomyopathies: Secondary | ICD-10-CM | POA: Diagnosis present

## 2013-09-05 DIAGNOSIS — Z79899 Other long term (current) drug therapy: Secondary | ICD-10-CM

## 2013-09-05 DIAGNOSIS — I5033 Acute on chronic diastolic (congestive) heart failure: Secondary | ICD-10-CM | POA: Diagnosis present

## 2013-09-05 DIAGNOSIS — F329 Major depressive disorder, single episode, unspecified: Secondary | ICD-10-CM | POA: Diagnosis present

## 2013-09-05 DIAGNOSIS — I129 Hypertensive chronic kidney disease with stage 1 through stage 4 chronic kidney disease, or unspecified chronic kidney disease: Secondary | ICD-10-CM | POA: Diagnosis present

## 2013-09-05 HISTORY — DX: Acute posthemorrhagic anemia: D62

## 2013-09-05 HISTORY — DX: Chronic kidney disease, stage 3 (moderate): N18.3

## 2013-09-05 HISTORY — DX: Gastric ulcer, unspecified as acute or chronic, without hemorrhage or perforation: K25.9

## 2013-09-05 HISTORY — DX: Diverticulitis of intestine, part unspecified, without perforation or abscess without bleeding: K57.92

## 2013-09-05 HISTORY — DX: Ulcer of esophagus without bleeding: K22.10

## 2013-09-05 HISTORY — DX: Long term (current) use of anticoagulants: Z79.01

## 2013-09-05 LAB — CBC WITH DIFFERENTIAL/PLATELET
BASOS ABS: 0 10*3/uL (ref 0.0–0.1)
BASOS PCT: 0 % (ref 0–1)
Eosinophils Absolute: 0 10*3/uL (ref 0.0–0.7)
Eosinophils Relative: 0 % (ref 0–5)
HCT: 29.1 % — ABNORMAL LOW (ref 36.0–46.0)
Hemoglobin: 10.1 g/dL — ABNORMAL LOW (ref 12.0–15.0)
Lymphocytes Relative: 7 % — ABNORMAL LOW (ref 12–46)
Lymphs Abs: 1 10*3/uL (ref 0.7–4.0)
MCH: 30.7 pg (ref 26.0–34.0)
MCHC: 34.7 g/dL (ref 30.0–36.0)
MCV: 88.4 fL (ref 78.0–100.0)
Monocytes Absolute: 1.1 10*3/uL — ABNORMAL HIGH (ref 0.1–1.0)
Monocytes Relative: 8 % (ref 3–12)
NEUTROS ABS: 11.8 10*3/uL — AB (ref 1.7–7.7)
Neutrophils Relative %: 85 % — ABNORMAL HIGH (ref 43–77)
Platelets: 428 10*3/uL — ABNORMAL HIGH (ref 150–400)
RBC: 3.29 MIL/uL — ABNORMAL LOW (ref 3.87–5.11)
RDW: 14.5 % (ref 11.5–15.5)
WBC: 13.9 10*3/uL — ABNORMAL HIGH (ref 4.0–10.5)

## 2013-09-05 LAB — BASIC METABOLIC PANEL
BUN: 8 mg/dL (ref 6–23)
CO2: 24 mEq/L (ref 19–32)
Calcium: 8.2 mg/dL — ABNORMAL LOW (ref 8.4–10.5)
Chloride: 99 mEq/L (ref 96–112)
Creatinine, Ser: 1.06 mg/dL (ref 0.50–1.10)
GFR, EST AFRICAN AMERICAN: 54 mL/min — AB (ref >90)
GFR, EST NON AFRICAN AMERICAN: 47 mL/min — AB (ref >90)
Glucose, Bld: 112 mg/dL — ABNORMAL HIGH (ref 70–99)
Potassium: 2.9 mEq/L — CL (ref 3.7–5.3)
Sodium: 137 mEq/L (ref 137–147)

## 2013-09-05 LAB — URIC ACID: Uric Acid, Serum: 9.3 mg/dL — ABNORMAL HIGH (ref 2.4–7.0)

## 2013-09-05 LAB — MAGNESIUM: Magnesium: 1.5 mg/dL (ref 1.5–2.5)

## 2013-09-05 MED ORDER — TAB-A-VITE/IRON PO TABS
1.0000 | ORAL_TABLET | Freq: Every day | ORAL | Status: DC
  Administered 2013-09-06 – 2013-09-16 (×10): 1 via ORAL
  Filled 2013-09-05 (×12): qty 1

## 2013-09-05 MED ORDER — INDOMETHACIN 25 MG PO CAPS
50.0000 mg | ORAL_CAPSULE | Freq: Once | ORAL | Status: AC
  Administered 2013-09-05: 50 mg via ORAL
  Filled 2013-09-05: qty 2

## 2013-09-05 MED ORDER — MORPHINE SULFATE 2 MG/ML IJ SOLN
2.0000 mg | INTRAMUSCULAR | Status: DC | PRN
  Administered 2013-09-06 – 2013-09-11 (×12): 2 mg via INTRAVENOUS
  Filled 2013-09-05 (×13): qty 1

## 2013-09-05 MED ORDER — ALBUTEROL SULFATE (2.5 MG/3ML) 0.083% IN NEBU: 2.5000 mg | INHALATION_SOLUTION | RESPIRATORY_TRACT | Status: DC | PRN

## 2013-09-05 MED ORDER — HYDROCODONE-ACETAMINOPHEN 5-325 MG PO TABS
1.0000 | ORAL_TABLET | Freq: Once | ORAL | Status: AC
  Administered 2013-09-05: 1 via ORAL
  Filled 2013-09-05: qty 1

## 2013-09-05 MED ORDER — POTASSIUM CHLORIDE IN NACL 20-0.9 MEQ/L-% IV SOLN
INTRAVENOUS | Status: DC
  Administered 2013-09-05 (×2): via INTRAVENOUS

## 2013-09-05 MED ORDER — ACETAMINOPHEN 325 MG PO TABS
650.0000 mg | ORAL_TABLET | Freq: Four times a day (QID) | ORAL | Status: DC | PRN
  Filled 2013-09-05: qty 2

## 2013-09-05 MED ORDER — FUROSEMIDE 20 MG PO TABS
20.0000 mg | ORAL_TABLET | Freq: Two times a day (BID) | ORAL | Status: DC
  Administered 2013-09-06: 20 mg via ORAL
  Filled 2013-09-05: qty 1

## 2013-09-05 MED ORDER — FAMOTIDINE 20 MG PO TABS
20.0000 mg | ORAL_TABLET | Freq: Two times a day (BID) | ORAL | Status: DC
  Administered 2013-09-05 – 2013-09-06 (×3): 20 mg via ORAL
  Filled 2013-09-05 (×3): qty 1

## 2013-09-05 MED ORDER — COLCHICINE 0.6 MG PO TABS
0.6000 mg | ORAL_TABLET | Freq: Once | ORAL | Status: AC
  Administered 2013-09-05: 0.6 mg via ORAL
  Filled 2013-09-05: qty 1

## 2013-09-05 MED ORDER — POTASSIUM CHLORIDE CRYS ER 20 MEQ PO TBCR
40.0000 meq | EXTENDED_RELEASE_TABLET | Freq: Once | ORAL | Status: AC
  Administered 2013-09-05: 40 meq via ORAL
  Filled 2013-09-05: qty 2

## 2013-09-05 MED ORDER — ACETAMINOPHEN 650 MG RE SUPP: 650.0000 mg | Freq: Four times a day (QID) | RECTAL | Status: DC | PRN

## 2013-09-05 MED ORDER — APIXABAN 5 MG PO TABS
2.5000 mg | ORAL_TABLET | Freq: Two times a day (BID) | ORAL | Status: DC
  Administered 2013-09-05 – 2013-09-07 (×5): 2.5 mg via ORAL
  Filled 2013-09-05 (×5): qty 1

## 2013-09-05 MED ORDER — CYANOCOBALAMIN 1000 MCG/ML IJ SOLN
1000.0000 ug | Freq: Once | INTRAMUSCULAR | Status: AC
  Administered 2013-09-05: 1000 ug via INTRAMUSCULAR
  Filled 2013-09-05: qty 1

## 2013-09-05 MED ORDER — POTASSIUM CHLORIDE CRYS ER 10 MEQ PO TBCR
30.0000 meq | EXTENDED_RELEASE_TABLET | Freq: Two times a day (BID) | ORAL | Status: AC
  Administered 2013-09-05 (×2): 30 meq via ORAL
  Filled 2013-09-05 (×4): qty 1

## 2013-09-05 MED ORDER — ALUM & MAG HYDROXIDE-SIMETH 200-200-20 MG/5ML PO SUSP: 30.0000 mL | Freq: Four times a day (QID) | ORAL | Status: DC | PRN

## 2013-09-05 MED ORDER — TEMAZEPAM 15 MG PO CAPS
15.0000 mg | ORAL_CAPSULE | Freq: Every day | ORAL | Status: DC
  Administered 2013-09-05 – 2013-09-15 (×11): 15 mg via ORAL
  Filled 2013-09-05 (×12): qty 1

## 2013-09-05 MED ORDER — DOXEPIN HCL 3 MG PO TABS: 1.0000 | ORAL_TABLET | Freq: Every day | ORAL | Status: DC

## 2013-09-05 MED ORDER — PREDNISONE 20 MG PO TABS
20.0000 mg | ORAL_TABLET | Freq: Two times a day (BID) | ORAL | Status: DC
  Administered 2013-09-06: 20 mg via ORAL
  Filled 2013-09-05: qty 1

## 2013-09-05 MED ORDER — ONDANSETRON HCL 4 MG PO TABS
4.0000 mg | ORAL_TABLET | Freq: Four times a day (QID) | ORAL | Status: DC | PRN
  Filled 2013-09-05: qty 1

## 2013-09-05 MED ORDER — METRONIDAZOLE 500 MG PO TABS
500.0000 mg | ORAL_TABLET | Freq: Three times a day (TID) | ORAL | Status: DC
  Administered 2013-09-05 – 2013-09-06 (×5): 500 mg via ORAL
  Filled 2013-09-05 (×5): qty 1

## 2013-09-05 MED ORDER — HYDROCODONE-ACETAMINOPHEN 5-325 MG PO TABS
1.0000 | ORAL_TABLET | Freq: Four times a day (QID) | ORAL | Status: DC | PRN
  Administered 2013-09-06: 1 via ORAL
  Administered 2013-09-06 – 2013-09-09 (×8): 2 via ORAL
  Filled 2013-09-05: qty 2
  Filled 2013-09-05: qty 1
  Filled 2013-09-05 (×7): qty 2

## 2013-09-05 MED ORDER — ONDANSETRON HCL 4 MG/2ML IJ SOLN: 4.0000 mg | Freq: Four times a day (QID) | INTRAMUSCULAR | Status: DC | PRN

## 2013-09-05 MED ORDER — METHYLPREDNISOLONE SODIUM SUCC 40 MG IJ SOLR
40.0000 mg | Freq: Once | INTRAMUSCULAR | Status: AC
  Administered 2013-09-05: 40 mg via INTRAVENOUS
  Filled 2013-09-05: qty 1

## 2013-09-05 MED ORDER — CARVEDILOL 6.25 MG PO TABS
6.2500 mg | ORAL_TABLET | Freq: Two times a day (BID) | ORAL | Status: DC
  Administered 2013-09-05 – 2013-09-16 (×22): 6.25 mg via ORAL
  Filled 2013-09-05 (×6): qty 1
  Filled 2013-09-05 (×5): qty 2
  Filled 2013-09-05 (×3): qty 1
  Filled 2013-09-05: qty 2
  Filled 2013-09-05 (×8): qty 1
  Filled 2013-09-05: qty 2

## 2013-09-05 NOTE — ED Notes (Signed)
Report called to [facility] for room 315 for admission. Hospitalist Dr. Caryn Section in room talking with pt at this time.

## 2013-09-05 NOTE — ED Notes (Signed)
Bunion to rt foot causing increased pain to foot.

## 2013-09-05 NOTE — ED Notes (Signed)
MD at bedside. 

## 2013-09-05 NOTE — ED Provider Notes (Signed)
CSN: [id number]     Arrival date & time 09/05/13  0658 History   This chart was scribed for Kimberly Norrie, MD by Lovena Le Day, ED scribe. This patient was seen in room APA05/APA05 and the patient's care was started at 0658.  Chief Complaint  Patient presents with  . Foot Pain   The history is provided by the patient. No language interpreter was used.   HPI Comments: Kimberly [name] is a 80 y.o. female with a h/o bunions (s/p repair on the left) who presents to the Emergency Department for right foot pain, onset yesterday. She states has had this problem for years and it recently worsened and kept her from sleeping last PM. She denies fever/chills. She denies any recent injuries, falls or blunt trauma; she denies any recent change of her shoes. She attributes this pain to hx of right foot bunion and that she needs to have surgery on it. She has a walker but hasnt had to use it. She denies any drainage from her foot. She has not taken any medicine for this problem. She has oxycodone but states was in too much pain to get up and walk to get her pain medicine. She has had the same problem before of her left foot.   PCP Dr. Jacelyn Grip  Past Medical History  Diagnosis Date  . Depression   . Atrial fibrillation   . Scoliosis   . Osteoarthritis   . Pacemaker   . Fibromyalgia   . Hypertension   . Glaucoma   . Acute diverticulitis 08/24/2013  . CKD (chronic kidney disease) stage 3, GFR 30-59 ml/min 10/12/2012  . Chronic anticoagulation 10/12/2012  . Acute blood loss anemia 10/11/2012  . Antral ulcer 10/11/2012  . Erosive esophagitis 10/11/2012   Past Surgical History  Procedure Laterality Date  . Pacemaker insertion    . Abdominal hysterectomy    . Cholecystectomy    . Appendectomy    . Breast surgery    . Back surgery    . Neck surgery    . Hernia repair      right inguinal hernia and umbilical  . Tonsillectomy    . Esophagogastroduodenoscopy N/A 10/13/2012    Procedure: ESOPHAGOGASTRODUODENOSCOPY (EGD);   Surgeon: Daneil Dolin, MD;  Location: [facility];  Service: Endoscopy;  Laterality: N/A;  . Cystoscopy N/A 02/24/2013    Procedure: CYSTOSCOPY WITH URETHRAL DILITATION;  Surgeon: Marissa Nestle, MD;  Location: [facility] ORS;  Service: Urology;  Laterality: N/A;   Family History  Problem Relation Age of Onset  . Cancer Sister   . Asthma Sister   . Heart failure Brother   . Colon cancer Neg Hx    History  Substance Use Topics  . Smoking status: Former Smoker -- 0.50 packs/day for 30 years    Types: Cigarettes    Quit date: 12/13/2004  . Smokeless tobacco: Not on file  . Alcohol Use: 0.6 oz/week    1 Glasses of wine per week     Comment: several glasses of wine each evening   Lives at home Lives alone  OB History   Grav Para Term Preterm Abortions TAB SAB Ect Mult Living   5 2 1 1 3  3   2      Review of Systems  Constitutional: Negative for fever and chills.  Respiratory: Negative for cough and shortness of breath.   Cardiovascular: Negative for chest pain.  Gastrointestinal: Negative for abdominal pain.  Musculoskeletal: Negative for back pain.  Right great toe pain  All other systems reviewed and are negative.   Allergies  Iodine; Penicillins; and Sulfa antibiotics  Home Medications   Prior to Admission medications   Medication Sig Start Date End Date Taking? Authorizing Provider  apixaban (ELIQUIS) 2.5 MG TABS tablet Take 2.5 mg by mouth 2 (two) times daily.    Historical Provider, MD  carvedilol (COREG) 6.25 MG tablet Take 1 tablet (6.25 mg total) by mouth 2 (two) times daily with a meal. 08/02/13   Lorretta Harp, MD  ciprofloxacin (CIPRO) 500 MG tablet Take 1 tablet (500 mg total) by mouth 2 (two) times daily. 08/28/13   Kathie Dike, MD  Doxepin HCl 3 MG TABS Take 1 tablet by mouth at bedtime. 06/15/13   Tammy Eckard, PHARMD  furosemide (LASIX) 20 MG tablet Take 2 tablets by mouth  daily 08/31/13   Lorretta Harp, MD  HYDROcodone-acetaminophen  (NORCO/VICODIN) 5-325 MG per tablet Take 1-2 tablets by mouth every 6 (six) hours as needed for moderate pain. 08/28/13   Kathie Dike, MD  metroNIDAZOLE (FLAGYL) 500 MG tablet Take 1 tablet (500 mg total) by mouth 3 (three) times daily. 08/28/13   Kathie Dike, MD  omeprazole (PRILOSEC) 20 MG capsule Take 20 mg by mouth daily.    Historical Provider, MD  polyethylene glycol (MIRALAX) packet Take 17 g by mouth daily as needed. 08/28/13   Kathie Dike, MD   Triage Vitals: BP 119/55  Pulse 71  Temp(Src) 98.6 F (37 C)  Resp 16  Ht 5\' 5"  (1.651 m)  Wt 140 lb (63.504 kg)  BMI 23.30 kg/m2  SpO2 99%  Vital signs normal    Physical Exam  Nursing note and vitals reviewed. Constitutional: She is oriented to person, place, and time. She appears well-developed and well-nourished.  Non-toxic appearance. She does not appear ill. No distress.  HENT:  Head: Normocephalic and atraumatic.  Right Ear: External ear normal.  Left Ear: External ear normal.  Nose: Nose normal. No mucosal edema or rhinorrhea.  Mouth/Throat: Oropharynx is clear and moist and mucous membranes are normal. No dental abscesses or uvula swelling.  Eyes: Conjunctivae and EOM are normal. Pupils are equal, round, and reactive to light.  Neck: Normal range of motion and full passive range of motion without pain. Neck supple.  Cardiovascular: Normal rate, regular rhythm and normal heart sounds.  Exam reveals no gallop and no friction rub.   No murmur heard. Pulmonary/Chest: Effort normal and breath sounds normal. No respiratory distress. She has no wheezes. She has no rhonchi. She has no rales. She exhibits no tenderness and no crepitus.  Abdominal: Soft. Normal appearance and bowel sounds are normal. She exhibits no distension. There is no tenderness. There is no rebound and no guarding.  Musculoskeletal: Normal range of motion. She exhibits tenderness. She exhibits no edema.  Moves all extremities well.  She has marked redness  and warmth of the MTP of right great toe w/preexisting bunion. Suspicious for acute gouty arthritis.    Neurological: She is alert and oriented to person, place, and time. She has normal strength. No cranial nerve deficit.  Skin: Skin is warm, dry and intact. No rash noted. No erythema. No pallor.  Psychiatric: She has a normal mood and affect. Her speech is normal and behavior is normal. Her mood appears not anxious.      ED Course  Procedures (including critical care time)  Medications  HYDROcodone-acetaminophen (NORCO/VICODIN) 5-325 MG per tablet 1 tablet (1 tablet Oral  Given 09/05/13 0728)  indomethacin (INDOCIN) capsule 50 mg (50 mg Oral Given 09/05/13 0728)  potassium chloride SA (K-DUR,KLOR-CON) CR tablet 40 mEq (40 mEq Oral Given 09/05/13 0841)  colchicine tablet 0.6 mg (0.6 mg Oral Given 09/05/13 0841)    DIAGNOSTIC STUDIES: Oxygen Saturation is 99% on room air, normal by my interpretation.    COORDINATION OF CARE: At 720 AM Discussed treatment plan with patient which includes right foot X-ray. Patient agrees.   9:20 AM Pt reports that she still has too much foot pain and that is too painful for her to walk. She has a walker at home already. She states that she wants to be admitted to the hospital b/c she cannot walk secondary to her pain.   Nursing staff got patient up in a walker. Although she was able to stand she refused to ambulate. Patient states she cannot go home.  Patient was given potassium for her hypokalemia, most likely from her diuretic.  11:02 AM: Dr. Caryn Section admit to observation. Team 1, medical surgery bed.   Labs Review Results for orders placed during the hospital encounter of 09/05/13  URIC ACID      Result Value Ref Range   Uric Acid, Serum 9.3 (*) 2.4 - 7.0 mg/dL  BASIC METABOLIC PANEL      Result Value Ref Range   Sodium 137  137 - 147 mEq/L   Potassium 2.9 (*) 3.7 - 5.3 mEq/L   Chloride 99  96 - 112 mEq/L   CO2 24  19 - 32 mEq/L   Glucose, Bld 112  (*) 70 - 99 mg/dL   BUN 8  6 - 23 mg/dL   Creatinine, Ser 1.06  0.50 - 1.10 mg/dL   Calcium 8.2 (*) 8.4 - 10.5 mg/dL   GFR calc non Af Amer 47 (*) >90 mL/min   GFR calc Af Amer 54 (*) >90 mL/min  CBC WITH DIFFERENTIAL      Result Value Ref Range   WBC 13.9 (*) 4.0 - 10.5 K/uL   RBC 3.29 (*) 3.87 - 5.11 MIL/uL   Hemoglobin 10.1 (*) 12.0 - 15.0 g/dL   HCT 29.1 (*) 36.0 - 46.0 %   MCV 88.4  78.0 - 100.0 fL   MCH 30.7  26.0 - 34.0 pg   MCHC 34.7  30.0 - 36.0 g/dL   RDW 14.5  11.5 - 15.5 %   Platelets 428 (*) 150 - 400 K/uL   Neutrophils Relative % 85 (*) 43 - 77 %   Neutro Abs 11.8 (*) 1.7 - 7.7 K/uL   Lymphocytes Relative 7 (*) 12 - 46 %   Lymphs Abs 1.0  0.7 - 4.0 K/uL   Monocytes Relative 8  3 - 12 %   Monocytes Absolute 1.1 (*) 0.1 - 1.0 K/uL   Eosinophils Relative 0  0 - 5 %   Eosinophils Absolute 0.0  0.0 - 0.7 K/uL   Basophils Relative 0  0 - 1 %   Basophils Absolute 0.0  0.0 - 0.1 K/uL   Laboratory interpretation all normal except elevated uric acid consistent with gout, leukocytosis, stable anemia, hypokalemia  Imaging Review No results found.   EKG Interpretation None       Date: 09/05/2013  Rate: 70  Rhythm: paced rhythm   Narrative Interpretation:   Old EKG Reviewed: none available  MDM   Final diagnoses:  Acute gouty arthritis  Podagra  Hypokalemia   Plan admission to observation  Rolland Porter, MD, Abram Sander  I personally performed the services described in this documentation, which was scribed in my presence. The recorded information has been reviewed and considered.  Rolland Porter, MD, Abram Sander      Kimberly Norrie, MD 09/05/13 [phone]

## 2013-09-05 NOTE — H&P (Addendum)
[facility] History and Physical  Kimberly [name] [id number] DOB: [birth year] DOA: 09/05/2013  Referring physician:Iva Tomi Bamberger, MD PCP: Anthoney Harada, MD   Chief Complaint: Right foot pain/right great toe pain and inability to walk.  HPI: Kimberly [name] is a 80 y.o. female with a history of bunions, atrial fibrillation on chronic anticoagulation with Eliquis, status post pacemaker, hypertension, and recent hospitalization for acute diverticulitis. She presents to the emergency department today with a chief complaint of right foot pain, specifically right great toe pain. Her pain started last night. It is located on the medial aspect of her right great toe. She has had intermittent pain from the bunion, but not as bad as last night. The toe is red and is 10 over 10 in pain intensity. The pain intensified even worse when she tries to ambulate. She denies trauma. She denies any change in her shoes. She denies any open wound or drainage from the toe or foot. She took hydrocodone, but it did not relieve the pain. She denies associated fever or chills. She has been unable to ambulate because of pain. She wanted to be admitted for treatment. Of note, the patient was discharged on Cipro and Flagyl a week and a half ago for acute diverticulitis, but she says that she has had diarrhea for the past several days. She thinks the diarrhea is from Cipro. She denies bright red blood per or black tarry stools.  In the ED, she is afebrile and hemodynamically stable. Her lab data are significant for uric acid of 9.3, WBC of 13.9, hemoglobin of 10.1, potassium of 2.9, and glucose of 112. She is being admitted for further evaluation and management.     Review of Systems:  As above in history present illness. Otherwise review of systems is negative.  Past Medical History  Diagnosis Date  . Depression   . Atrial fibrillation   . Scoliosis   . Osteoarthritis   . Pacemaker   . Fibromyalgia   .  Hypertension   . Glaucoma   . Acute diverticulitis 08/24/2013  . CKD (chronic kidney disease) stage 3, GFR 30-59 ml/min 10/12/2012  . Chronic anticoagulation 10/12/2012  . Acute blood loss anemia 10/11/2012   Past Surgical History  Procedure Laterality Date  . Pacemaker insertion    . Abdominal hysterectomy    . Cholecystectomy    . Appendectomy    . Breast surgery    . Back surgery    . Neck surgery    . Hernia repair      right inguinal hernia and umbilical  . Tonsillectomy    . Esophagogastroduodenoscopy N/A 10/13/2012    Procedure: ESOPHAGOGASTRODUODENOSCOPY (EGD);  Surgeon: Daneil Dolin, MD;  Location: [facility];  Service: Endoscopy;  Laterality: N/A;  . Cystoscopy N/A 02/24/2013    Procedure: CYSTOSCOPY WITH URETHRAL DILITATION;  Surgeon: Marissa Nestle, MD;  Location: [facility]S;  Service: Urology;  Laterality: N/A;   Social History: She is widowed. She has 2 children who live out of state. She lives alone. She has a niece who visits occasionally. She has a neighbor who does her shopping. She has a visiting nurse every 3-4 weeks. She ambulates without a walker, although she has one. She stopped smoking approximately 8 years ago after having a 15 year pack year history. She denies illicit drug. She has an occasional alcoholic beverage.   Allergies  Allergen Reactions  . Iodine Rash and Other (See Comments)    REACTION:If injected,  Rash/irritated  skin reaction "welts"  . Penicillins Hives  . Sulfa Antibiotics Rash    Family History  Problem Relation Age of Onset  . Cancer Sister   . Asthma Sister   . Heart failure Brother   . Colon cancer Neg Hx      Prior to Admission medications   Medication Sig Start Date End Date Taking? Authorizing Provider  apixaban (ELIQUIS) 2.5 MG TABS tablet Take 2.5 mg by mouth 2 (two) times daily.   Yes Historical Provider, MD  carvedilol (COREG) 6.25 MG tablet Take 1 tablet (6.25 mg total) by mouth 2 (two) times daily with a meal. 08/02/13   Yes Lorretta Harp, MD  ciprofloxacin (CIPRO) 500 MG tablet Take 1 tablet (500 mg total) by mouth 2 (two) times daily. 08/28/13  Yes Kathie Dike, MD  Doxepin HCl 3 MG TABS Take 1 tablet by mouth at bedtime. 06/15/13  Yes Tammy Eckard, PHARMD  furosemide (LASIX) 20 MG tablet Take 2 tablets by mouth  daily 08/31/13  Yes Lorretta Harp, MD  HYDROcodone-acetaminophen (NORCO/VICODIN) 5-325 MG per tablet Take 1-2 tablets by mouth every 6 (six) hours as needed for moderate pain. 08/28/13  Yes Kathie Dike, MD  metroNIDAZOLE (FLAGYL) 500 MG tablet Take 1 tablet (500 mg total) by mouth 3 (three) times daily. 08/28/13  Yes Kathie Dike, MD  omeprazole (PRILOSEC) 20 MG capsule Take 20 mg by mouth daily.   Yes Historical Provider, MD  polyethylene glycol (MIRALAX) packet Take 17 g by mouth daily as needed. 08/28/13  Yes Kathie Dike, MD   Physical Exam: Filed Vitals:   09/05/13 1232  BP: 135/70  Pulse: 75  Temp: 97.9 F (36.6 C)  Resp:     BP 135/70  Pulse 75  Temp(Src) 97.9 F (36.6 C) (Oral)  Resp 16  Ht 5\' 7"  (1.702 m)  Wt 65.3 kg (143 lb 15.4 oz)  BMI 22.54 kg/m2  SpO2 100%  General:  Appears calm and comfortable; no acute distress. Eyes: PERRL, normal lids, irises & conjunctiva ENT: grossly normal hearing, lips & tongue Neck: no LAD, masses or thyromegaly Cardiovascular: S1, S2, with occasional ectopic beat, versus irregular, irregular. No LE edema. Telemetry: Paced rhythm.  Respiratory: CTA bilaterally, no w/r/r. Normal respiratory effort. Abdomen: Protuberant, soft, nontender, nondistended. Skin: no rash; fair turgor. Musculoskeletal: Right MTP joint with moderate erythema, warmth, and with moderate tenderness. Associated bunion. Range of motion of all of her extremities within normal limits. No other acute hot red joints. Psychiatric: grossly normal mood and affect, speech fluent and appropriate Neurologic: grossly non-focal.          Labs on Admission:  Basic  Metabolic Panel:  Recent Labs Lab 09/05/13 0742  NA 137  K 2.9*  CL 99  CO2 24  GLUCOSE 112*  BUN 8  CREATININE 1.06  CALCIUM 8.2*   Liver Function Tests: No results found for this basename: AST, ALT, ALKPHOS, BILITOT, PROT, ALBUMIN,  in the last 168 hours No results found for this basename: LIPASE, AMYLASE,  in the last 168 hours No results found for this basename: AMMONIA,  in the last 168 hours CBC:  Recent Labs Lab 09/05/13 0742  WBC 13.9*  NEUTROABS 11.8*  HGB 10.1*  HCT 29.1*  MCV 88.4  PLT 428*   Cardiac Enzymes: No results found for this basename: CKTOTAL, CKMB, CKMBINDEX, TROPONINI,  in the last 168 hours  BNP (last 3 results) No results found for this basename: PROBNP,  in the last 8760 hours  CBG: No results found for this basename: GLUCAP,  in the last 168 hours  Radiological Exams on Admission: No results found.  EKG: Independently reviewed. Paced rhythm, LVH, heart rate 70 beats per minute.  Assessment/Plan Principal Problem:   Acute gouty arthritis Active Problems:   Inability to ambulate due to ankle or foot   Hypokalemia   Acute diverticulitis   Diarrhea   Atrial fibrillation   Pacemaker   Hypertension   CKD (chronic kidney disease) stage 3, GFR 30-59 ml/min   Chronic anticoagulation   Anemia   1. Acute gouty arthritis, right MTP joint. This is an assumed diagnosis given the location of her pain an elevated uric acid level. She was given indomethacin and colchine in the emergency department, with modest pain relief. She is afraid to go home as she is unable to ambulate without assistance and she has no one to help her at home. The plan is to admit her for observation. Give her one dose of Solu-Medrol followed by a prednisone taper. We'll continue her chronic opiate analgesics as needed. We'll provide supportive treatment. We'll ask for a physical therapist evaluation. 2. Recent hospitalization for acute diverticulitis. She was discharged on  08/28/2013 with continued treatment with Cipro and Flagyl. She has been having diarrhea which she attributes to Cipro. She says that she had been taking both Flagyl and Cipro as prescribed. She denies abdominal pain, nausea, or vomiting. Will hold Cipro for now and order C. difficile PCR. Will continue Flagyl. 3. Diarrhea. As above in #2. 4. Hypokalemia. She has a history of the same. It is likely secondary to furosemide and possibly from recent diarrhea. Will replete in the IV fluids and orally. We'll order a magnesium level to rule out deficiency. 5. Chronic atrial fibrillation, status post pacemaker, chronic anticoagulation. We'll continue carvedilol and Eliquis. 6. Hypertension. Currently stable on carvedilol. 7. Chronic anemia with a history of blood loss anemia in June 2014 from severe erosive reflux esophagitis and prepyloric antral ulcer. We'll hold PPI and start H2 blocker until C. difficile is ruled out. If it is, will restart PPI. Of note, her anemia panel 08/26/13 revealed a total ferritin of 99, iron of 14, TIBC of 342, folate of 4.7, and vitamin B12 of 240. We'll give her one dose of vitamin B12 empirically and start a multivitamin with iron.    Code Status: Full code Family Communication: None available Disposition Plan: To be determined  Time spent: One hour  [facility] Pager [phone]

## 2013-09-05 NOTE — ED Notes (Signed)
CRITICAL VALUE ALERT  Critical value received:  Potassium 2.9 Date of notification:  09/05/13  Time of notification:  0812  Critical value read back:yes  Nurse who received alert:  Graciela Husbands  MD notified (1st page):  Tomi Bamberger  Time of first page:  0814  MD notified (2nd page):  Time of second page:  Responding MD:    Time MD responded:

## 2013-09-05 NOTE — ED Notes (Signed)
Pt ambulated a few steps with wt bearing on the right foot.

## 2013-09-06 ENCOUNTER — Observation Stay (HOSPITAL_COMMUNITY): Payer: Medicare Other

## 2013-09-06 DIAGNOSIS — I10 Essential (primary) hypertension: Secondary | ICD-10-CM

## 2013-09-06 DIAGNOSIS — I4891 Unspecified atrial fibrillation: Secondary | ICD-10-CM

## 2013-09-06 LAB — COMPREHENSIVE METABOLIC PANEL
ALBUMIN: 2.4 g/dL — AB (ref 3.5–5.2)
ALK PHOS: 55 U/L (ref 39–117)
AST: 11 U/L (ref 0–37)
BILIRUBIN TOTAL: 0.3 mg/dL (ref 0.3–1.2)
BUN: 10 mg/dL (ref 6–23)
CHLORIDE: 108 meq/L (ref 96–112)
CO2: 22 meq/L (ref 19–32)
CREATININE: 1 mg/dL (ref 0.50–1.10)
Calcium: 8.6 mg/dL (ref 8.4–10.5)
GFR calc Af Amer: 58 mL/min — ABNORMAL LOW (ref >90)
GFR, EST NON AFRICAN AMERICAN: 50 mL/min — AB (ref >90)
Glucose, Bld: 108 mg/dL — ABNORMAL HIGH (ref 70–99)
POTASSIUM: 4.5 meq/L (ref 3.7–5.3)
Sodium: 141 mEq/L (ref 137–147)
Total Protein: 5.9 g/dL — ABNORMAL LOW (ref 6.0–8.3)

## 2013-09-06 LAB — CBC
HCT: 27.1 % — ABNORMAL LOW (ref 36.0–46.0)
Hemoglobin: 9.4 g/dL — ABNORMAL LOW (ref 12.0–15.0)
MCH: 31 pg (ref 26.0–34.0)
MCHC: 34.7 g/dL (ref 30.0–36.0)
MCV: 89.4 fL (ref 78.0–100.0)
Platelets: 401 10*3/uL — ABNORMAL HIGH (ref 150–400)
RBC: 3.03 MIL/uL — AB (ref 3.87–5.11)
RDW: 15 % (ref 11.5–15.5)
WBC: 11.8 10*3/uL — ABNORMAL HIGH (ref 4.0–10.5)

## 2013-09-06 LAB — URINALYSIS, ROUTINE W REFLEX MICROSCOPIC
Bilirubin Urine: NEGATIVE
GLUCOSE, UA: NEGATIVE mg/dL
Hgb urine dipstick: NEGATIVE
Ketones, ur: NEGATIVE mg/dL
Nitrite: NEGATIVE
PH: 5.5 (ref 5.0–8.0)
Protein, ur: NEGATIVE mg/dL
Specific Gravity, Urine: 1.015 (ref 1.005–1.030)
Urobilinogen, UA: 0.2 mg/dL (ref 0.0–1.0)

## 2013-09-06 LAB — MAGNESIUM: MAGNESIUM: 1.6 mg/dL (ref 1.5–2.5)

## 2013-09-06 LAB — CLOSTRIDIUM DIFFICILE BY PCR: CDIFFPCR: NEGATIVE

## 2013-09-06 LAB — URINE MICROSCOPIC-ADD ON

## 2013-09-06 MED ORDER — POTASSIUM CHLORIDE IN NACL 20-0.9 MEQ/L-% IV SOLN
INTRAVENOUS | Status: DC
  Administered 2013-09-06: 12:00:00 via INTRAVENOUS

## 2013-09-06 MED ORDER — PANTOPRAZOLE SODIUM 40 MG IV SOLR
40.0000 mg | INTRAVENOUS | Status: DC
  Administered 2013-09-06: 40 mg via INTRAVENOUS
  Filled 2013-09-06: qty 40

## 2013-09-06 MED ORDER — PREDNISONE 10 MG PO TABS
10.0000 mg | ORAL_TABLET | Freq: Two times a day (BID) | ORAL | Status: AC
  Administered 2013-09-06 – 2013-09-07 (×3): 10 mg via ORAL
  Filled 2013-09-06 (×3): qty 1

## 2013-09-06 MED ORDER — KCL IN DEXTROSE-NACL 20-5-0.9 MEQ/L-%-% IV SOLN: INTRAVENOUS | Status: DC

## 2013-09-06 MED ORDER — SODIUM CHLORIDE 0.9 % IV SOLN
1.0000 g | Freq: Two times a day (BID) | INTRAVENOUS | Status: DC
  Administered 2013-09-06 – 2013-09-09 (×7): 1 g via INTRAVENOUS
  Filled 2013-09-06 (×10): qty 1

## 2013-09-06 NOTE — Progress Notes (Addendum)
[facility] PROGRESS NOTE  Kimberly [name] [id number] DOB: 09-02-1926 DOA: 09/05/2013 PCP: Anthoney Harada, MD    Code Status: Full code Family Communication: No family available Disposition Plan: Discharge to home with home health physical therapy versus skilled nursing facility in the next 24-48 hours.   Consultants:  None  Procedures:  None  Antibiotics:  Metronidazole 09/05/13.  HPI/Subjective: The patient continues to have soreness of her right foot over the right great toe, but less than yesterday. Her major complaint is diarrhea. She has some lower abdominal "soreness", but she denies severe pain. She tolerated her full liquid diet this morning without nausea, vomiting, or worsening of her abdominal soreness. She complains of some shortness of breath.  Objective: Filed Vitals:   09/06/13 0655  BP: 141/57  Pulse: 70  Temp: 98.1 F (36.7 C)  Resp: 18    Intake/Output Summary (Last 24 hours) at 09/06/13 1104 Last data filed at 09/05/13 1800  Gross per 24 hour  Intake    289 ml  Output      0 ml  Net    289 ml   Filed Weights   09/05/13 0658 09/05/13 1232 09/06/13 0500  Weight: 63.504 kg (140 lb) 65.3 kg (143 lb 15.4 oz) 64.5 kg (142 lb 3.2 oz)    Exam:   General:  Pleasant alert 80 year old woman sitting up in bed, in no acute distress.  Cardiovascular: Irregular, irregular.  Respiratory: Mildly dyspneic when speaking. However, lungs are decreased breath sounds in the bases, but otherwise clear.  Abdomen: Positive bowel sounds, soft, less protuberant, only very minimal tenderness over the hypogastrium without guarding or distention.  Musculoskeletal: Right MTP joint with less erythema and warmth. Mildly tender. Range of motion of her right foot within normal limits without tenderness. No pedal edema or pretibial edema.  Neurologic: She is alert and oriented x3.   Data Reviewed: Basic Metabolic Panel:  Recent Labs Lab 09/05/13 0742  09/06/13 0453  NA 137 141  K 2.9* 4.5  CL 99 108  CO2 24 22  GLUCOSE 112* 108*  BUN 8 10  CREATININE 1.06 1.00  CALCIUM 8.2* 8.6  MG 1.5 1.6   Liver Function Tests:  Recent Labs Lab 09/06/13 0453  AST 11  ALT <5  ALKPHOS 55  BILITOT 0.3  PROT 5.9*  ALBUMIN 2.4*   No results found for this basename: LIPASE, AMYLASE,  in the last 168 hours No results found for this basename: AMMONIA,  in the last 168 hours CBC:  Recent Labs Lab 09/05/13 0742 09/06/13 0453  WBC 13.9* 11.8*  NEUTROABS 11.8*  --   HGB 10.1* 9.4*  HCT 29.1* 27.1*  MCV 88.4 89.4  PLT 428* 401*   Cardiac Enzymes: No results found for this basename: CKTOTAL, CKMB, CKMBINDEX, TROPONINI,  in the last 168 hours BNP (last 3 results) No results found for this basename: PROBNP,  in the last 8760 hours CBG: No results found for this basename: GLUCAP,  in the last 168 hours  No results found for this or any previous visit (from the past 240 hour(s)).   Studies: No results found.  Scheduled Meds: . apixaban  2.5 mg Oral BID  . carvedilol  6.25 mg Oral BID WC  . famotidine  20 mg Oral BID  . furosemide  20 mg Oral BID  . metroNIDAZOLE  500 mg Oral TID  . multivitamins with iron  1 tablet Oral Daily  . predniSONE  20 mg Oral BID WC  .  temazepam  15 mg Oral QHS   Continuous Infusions:   Brief summary: The patient is an 80 year old woman with a history of bunions, atrial fibrillation on chronic anticoagulation with Eliquis, status post pacemaker, hypertension, and recent hospitalization for acute diverticulitis. She presented to the emergency department on 09/05/13 with right foot/right great toe pain and the inability to walk. She was presumed to have an acute gouty arthritis. She was given indomethacin and colchicine in the emergency department, but has been since started on a prednisone taper. She has less pain. The physical therapist is recommending home health physical therapy versus skilled if she does  not improve. The patient was discharged a week ago on Cipro and Flagyl for acute diverticulitis with microperforation. She says that the Cipro was causing diarrhea. She is still having diarrhea. Cipro is on hold, but the Flagyl is being continued. We'll order a followup CT scan of her abdomen and pelvis and C. difficile PCR. Omeprazole is being held pending C. difficile PCR result. We'll continue H2 blocker. Disposition is unclear at this time. She may need another 24-48 hours in the hospital.   Assessment and plan:  Principal Problem:   Acute gouty arthritis Active Problems:   Inability to ambulate due to ankle or foot   Hypokalemia   Acute diverticulitis   Diarrhea   Atrial fibrillation   Pacemaker   Hypertension   CKD (chronic kidney disease) stage 3, GFR 30-59 ml/min   Chronic anticoagulation   Anemia   1. Acute gouty arthritis. Slightly symptomatically improved. Will continue prednisone 20 mg twice a day. Would taper to 10 mg twice a day tomorrow. Continue analgesics as needed. Followup PT evaluation tomorrow to determine disposition. 2. Ongoing diarrhea with recent acute diverticulitis and microperforation. We'll continue Flagyl. Will hold Cipro until C. difficile PCR results are known. We'll order a CT scan of the abdomen and pelvis with oral contrast to invite wait for interval changes from the previous CT scan. Continue gentle IV fluids if the patient has no sign of volume overload. 3. Symptomatic shortness of breath. The patient's lungs are mostly clear. She is oxygenating 100% on room air. She is chronically anticoagulated. Nevertheless, will hold IV fluids and order a followup chest x-ray. Continue supportive treatment. 4. Hypokalemia. Likely secondary to Lasix and diarrhea. Currently supplemented and repleted. Magnesium level normal, but borderline low. We'll hold on supplementing in the setting of diarrhea. 5. Chronic atrial fibrillation, on chronic Eliquis and  carvedilol. Her heart rate is controlled. We'll continue to monitor. 6. Status post pacemaker. Currently stable. 7. Hypertension. Controlled on carvedilol. 8. Stage II to stage III chronic kidney disease. Creatinine is currently stable.  9.Chronic anemia with a history of blood loss anemia in June 2014 from severe erosive reflux esophagitis and prepyloric antral ulcer. We'll hold PPI and continue H2 blocker until C. difficile is ruled out. If it is, will restart PPI. Of note, her anemia panel 08/26/13 revealed a total ferritin of 99, iron of 14, TIBC of 342, folate of 4.7, and vitamin B12 of 240. We'll give her one dose of vitamin B12 empirically and start a multivitamin with iron.   Time spent: 40 minutes    Rexene Alberts  [facility] Pager [phone]. If 7PM-7AM, please contact night-coverage at [email or web address], password [facility] 09/06/2013, 11:04 AM  LOS: 1 day      ADDENDUM:  CT SCAN OF THE PATIENT'S ABDOMEN AND PELVIS REVEALED A 4 CM ABSCESS IN THE MIDLINE OF THE PELVIS POSTERIOR TO THE  BLADDER AT THE SITE OF THE PREVIOUS MICROPERFORATION; THE ABSCESS CONTAINS FLUID AND AIR AND APPEARS TO COMMUNICATE DIRECTLY WITH THE SIGMOID PORTION OF THE COLON.  In light of these findings, will downgraded the patient to a sips and ice chips. Will add meropenem to Flagyl. We'll consult general surgery.

## 2013-09-06 NOTE — Clinical Social Work Psychosocial (Signed)
Clinical Social Work Department BRIEF PSYCHOSOCIAL ASSESSMENT 09/06/2013  Patient:  Kimberly [name], Kimberly [name]     Account Number:  [id number]     Admit date:  09/05/2013  Clinical Social Worker:  Edwyna Shell, CLINICAL SOCIAL WORKER  Date/Time:  09/06/2013 03:00 PM  Referred by:  Physician  Date Referred:  09/06/2013 Referred for  SNF Placement   Other Referral:   Interview type:  Patient Other interview type:    PSYCHOSOCIAL DATA Living Status:  ALONE Admitted from facility:   Level of care:   Primary support name:  [name] Primary support relationship to patient:  CHILD, ADULT Degree of support available:   Patient states she lives alone and is independent, has niece in adjoining town.    CURRENT CONCERNS Current Concerns  Post-Acute Placement   Other Concerns:    SOCIAL WORK ASSESSMENT / PLAN CSW met w patient at bedside, patient alert and oriented x4.  Patient says "I want to do PT at home."  Clearly states she does not want to go to SNF for rehab.  CSW checked PT recommendation which appears to recommend [facility]. Spoke w MD, this recommendation may be clarified or adjusted.  However at present, patient states she intends to return home and would like [facility].  Patient says she lives alone but is checked on by a niece who lives in an adjoining town.  CSW will sign off at present as patient is declining SNF.   Assessment/plan status:  No Further Intervention Required Other assessment/ plan:   Information/referral to community resources:   Referral to RN CM for home health needs    PATIENT'S/FAMILY'S RESPONSE TO PLAN OF CARE: Patient wants to remain independent and return home.        Edwyna Shell, [name] Clinical Social Worker [phone])

## 2013-09-06 NOTE — Progress Notes (Signed)
ANTIBIOTIC CONSULT NOTE - INITIAL  Pharmacy Consult for Meropenem Indication: Intra-abdominal infection  Allergies  Allergen Reactions  . Iodine Rash and Other (See Comments)    REACTION:If injected,  Rash/irritated skin reaction "welts"  . Penicillins Hives  . Sulfa Antibiotics Rash    Patient Measurements: Height: 5\' 7"  (170.2 cm) Weight: 142 lb 3.2 oz (64.5 kg) IBW/kg (Calculated) : 61.6 Adjusted Body Weight:   Vital Signs: Temp: 97.6 F (36.4 C) (05/05 1405) Temp src: Oral (05/05 1405) BP: 138/79 mmHg (05/05 1405) Pulse Rate: 77 (05/05 1405) Intake/Output from previous day: 05/04 0701 - 05/05 0700 In: 289 [P.O.:120; I.V.:169] Out: -  Intake/Output from this shift: Total I/O In: 686.9 [P.O.:480; I.V.:206.9] Out: -   Labs:  Recent Labs  09/05/13 0742 09/06/13 0453  WBC 13.9* 11.8*  HGB 10.1* 9.4*  PLT 428* 401*  CREATININE 1.06 1.00   Estimated Creatinine Clearance: 40.7 ml/min (by C-G formula based on Cr of 1). No results found for this basename: VANCOTROUGH, Corlis Leak, VANCORANDOM, [place], GENTPEAK, GENTRANDOM, TOBRATROUGH, TOBRAPEAK, TOBRARND, AMIKACINPEAK, AMIKACINTROU, AMIKACIN,  in the last 72 hours   Microbiology: Recent Results (from the past 720 hour(s))  CLOSTRIDIUM DIFFICILE BY PCR     Status: None   Collection Time    09/06/13  6:44 AM      Result Value Ref Range Status   C difficile by pcr NEGATIVE  NEGATIVE Final    Medical History: Past Medical History  Diagnosis Date  . Depression   . Atrial fibrillation   . Scoliosis   . Osteoarthritis   . Pacemaker   . Fibromyalgia   . Hypertension   . Glaucoma   . Acute diverticulitis 08/24/2013  . CKD (chronic kidney disease) stage 3, GFR 30-59 ml/min 10/12/2012  . Chronic anticoagulation 10/12/2012  . Acute blood loss anemia 10/11/2012  . Antral ulcer 10/11/2012  . Erosive esophagitis 10/11/2012    Medications:  Scheduled:  . apixaban  2.5 mg Oral BID  . carvedilol  6.25 mg Oral BID WC   . meropenem (MERREM) IV  1 g Intravenous Q12H  . metroNIDAZOLE  500 mg Oral TID  . multivitamins with iron  1 tablet Oral Daily  . pantoprazole (PROTONIX) IV  40 mg Intravenous Q24H  . predniSONE  10 mg Oral BID WC  . temazepam  15 mg Oral QHS   Assessment: CT imaging of abdomen, abscess midline of pelvis posterior to bladder at site of previous diverticulitis and microperforation.  Goal of Therapy:  Eradicate infection  Plan:  Meropenem 1 GM IV every 12 hours Monitor renal function Labs per protocol  Kimberly [name] 09/06/2013,4:34 PM

## 2013-09-06 NOTE — Evaluation (Signed)
Physical Therapy Evaluation Patient Details Name: Kimberly [name] MRN: [id number] DOB: Jul 03, 1926 Today's Date: 09/06/2013   History of Present Illness  Kimberly [name] is a 80 y.o. female with a past medical history of atrial fibrillation with pacemaker on chronic anticoagulation, arthritis, fibromyalgia, who has had lower abdominal discomfort for the past many months. Pt presents wtih acute gouty arthritis of right foot.  Clinical Impression  Pt presents with SOB with activity, acute pain in R foot and general deconditioning. Pt reports she lives alone and will need to be independent with her mobility. Anticipate pt will be independent shortly and will be safe to d/c back home with HHPT follow-up. Pt would benefit from skilled PT while an inpatient for improved safety and independence.    Follow Up Recommendations Home health PT    Equipment Recommendations  None recommended by PT    Recommendations for Other Services       Precautions / Restrictions Precautions Precautions: Fall Restrictions Weight Bearing Restrictions: No      Mobility  Bed Mobility Overal bed mobility: Modified Independent                Transfers Overall transfer level: Needs assistance Equipment used: Rolling walker (2 wheeled) Transfers: Sit to/from Stand Sit to Stand: Min guard         General transfer comment: cues for technique and hand placement for increased safety.  Ambulation/Gait Ambulation/Gait assistance: Min guard Ambulation Distance (Feet): 10 Feet (2 trials) Assistive device: Rolling walker (2 wheeled) Gait Pattern/deviations: Step-through pattern;Decreased step length - left;Decreased weight shift to right;Trunk flexed;Antalgic   Gait velocity interpretation: Below normal speed for age/gender General Gait Details: Pt c/o SOB with activity. Pt had loose stools during walk to bathroom and was not able to make it in the bathroom in time.   Stairs            Wheelchair  Mobility    Modified Rankin (Stroke Patients Only)       Balance Overall balance assessment: Needs assistance   Sitting balance-Leahy Scale: Normal     Standing balance support: Bilateral upper extremity supported Standing balance-Leahy Scale: Fair                               Pertinent Vitals/Pain     Home Living Family/patient expects to be discharged to:: Private residence Living Arrangements: Alone   Type of Home: House Home Access: Stairs to enter Entrance Stairs-Rails: None Entrance Stairs-Number of Steps: 2 Home Layout: One level Home Equipment: Environmental consultant - 2 wheels;Bedside commode      Prior Function Level of Independence: Independent with assistive device(s)   Gait / Transfers Assistance Needed: pt furniture walks at home           Hand Dominance        Extremity/Trunk Assessment   Upper Extremity Assessment: Defer to OT evaluation           Lower Extremity Assessment: Overall WFL for tasks assessed      Cervical / Trunk Assessment: Other exceptions  Communication   Communication: No difficulties  Cognition Arousal/Alertness: Awake/alert Behavior During Therapy: WFL for tasks assessed/performed Overall Cognitive Status: Within Functional Limits for tasks assessed                      General Comments      Exercises        Assessment/Plan  PT Assessment Patient needs continued PT services  PT Diagnosis Difficulty walking   PT Problem List Decreased activity tolerance;Decreased balance;Decreased mobility;Decreased safety awareness  PT Treatment Interventions DME instruction;Balance training;Gait training;Functional mobility training;Therapeutic activities;Patient/family education;Therapeutic exercise   PT Goals (Current goals can be found in the Care Plan section) Acute Rehab PT Goals Patient Stated Goal: To return home at independent level PT Goal Formulation: With patient Time For Goal Achievement:  09/20/13 Potential to Achieve Goals: Good    Frequency Min 3X/week   Barriers to discharge        Co-evaluation               End of Session Equipment Utilized During Treatment: Gait belt Activity Tolerance: Patient limited by fatigue Patient left: in bed;with call bell/phone within reach Nurse Communication: Mobility status    Functional Assessment Tool Used: clinical judgement Functional Limitation: Mobility: Walking and moving around Mobility: Walking and Moving Around Current Status [id number]): At least 1 percent but less than 20 percent impaired, limited or restricted Mobility: Walking and Moving Around Goal Status [phone]): 0 percent impaired, limited or restricted    Time: 0910-0945 PT Time Calculation (min): 35 min   Charges:   PT Evaluation $Initial PT Evaluation Tier I: 1 Procedure PT Treatments $Gait Training: 8-22 mins $Therapeutic Activity: 8-22 mins   PT G Codes:   Functional Assessment Tool Used: clinical judgement Functional Limitation: Mobility: Walking and moving around    [employer] 09/06/2013, 9:58 AM

## 2013-09-06 NOTE — Progress Notes (Signed)
UR Completed.  Kimberly [name] Kimberly [name] [phone] 09/06/2013  

## 2013-09-07 DIAGNOSIS — N183 Chronic kidney disease, stage 3 unspecified: Secondary | ICD-10-CM

## 2013-09-07 DIAGNOSIS — E876 Hypokalemia: Secondary | ICD-10-CM

## 2013-09-07 DIAGNOSIS — K5720 Diverticulitis of large intestine with perforation and abscess without bleeding: Secondary | ICD-10-CM | POA: Diagnosis present

## 2013-09-07 DIAGNOSIS — D62 Acute posthemorrhagic anemia: Secondary | ICD-10-CM

## 2013-09-07 LAB — BASIC METABOLIC PANEL
BUN: 12 mg/dL (ref 6–23)
CHLORIDE: 108 meq/L (ref 96–112)
CO2: 21 meq/L (ref 19–32)
Calcium: 8.5 mg/dL (ref 8.4–10.5)
Creatinine, Ser: 0.92 mg/dL (ref 0.50–1.10)
GFR calc Af Amer: 64 mL/min — ABNORMAL LOW (ref >90)
GFR calc non Af Amer: 56 mL/min — ABNORMAL LOW (ref >90)
Glucose, Bld: 127 mg/dL — ABNORMAL HIGH (ref 70–99)
Potassium: 4.3 mEq/L (ref 3.7–5.3)
Sodium: 141 mEq/L (ref 137–147)

## 2013-09-07 LAB — CBC
HEMATOCRIT: 25.7 % — AB (ref 36.0–46.0)
Hemoglobin: 9 g/dL — ABNORMAL LOW (ref 12.0–15.0)
MCH: 31.4 pg (ref 26.0–34.0)
MCHC: 35 g/dL (ref 30.0–36.0)
MCV: 89.5 fL (ref 78.0–100.0)
Platelets: 431 10*3/uL — ABNORMAL HIGH (ref 150–400)
RBC: 2.87 MIL/uL — ABNORMAL LOW (ref 3.87–5.11)
RDW: 15.2 % (ref 11.5–15.5)
WBC: 11 10*3/uL — ABNORMAL HIGH (ref 4.0–10.5)

## 2013-09-07 MED ORDER — HEPARIN SODIUM (PORCINE) 5000 UNIT/ML IJ SOLN
5000.0000 [IU] | Freq: Three times a day (TID) | INTRAMUSCULAR | Status: DC
  Filled 2013-09-07: qty 1

## 2013-09-07 MED ORDER — VITAMIN B-12 1000 MCG PO TABS
1000.0000 ug | ORAL_TABLET | Freq: Every day | ORAL | Status: DC
  Administered 2013-09-07 – 2013-09-16 (×9): 1000 ug via ORAL
  Filled 2013-09-07 (×10): qty 1

## 2013-09-07 MED ORDER — PANTOPRAZOLE SODIUM 40 MG PO TBEC
40.0000 mg | DELAYED_RELEASE_TABLET | Freq: Every day | ORAL | Status: DC
  Administered 2013-09-07 – 2013-09-16 (×9): 40 mg via ORAL
  Filled 2013-09-07 (×9): qty 1

## 2013-09-07 NOTE — Care Management Note (Addendum)
    Page 1 of 2   09/08/2013     2:18:53 PM CARE MANAGEMENT NOTE 09/08/2013  Patient:  Kimberly [name], Kimberly [name]   Account Number:  [id number]  Date Initiated:  09/06/2013  Documentation initiated by:  Vladimir Creeks  Subjective/Objective Assessment:   Pt admitted with possible acute gouty arthritis in toe. She was aving severe pain and difficulty walking. Patient lives at home alone. She says she has a great-niece wo may be willing to come and help for a day or two. She is going to call.     Action/Plan:   She may benefit from [facility] services to assist with bathing and PT for safety eval and treatment. States she has a walker and a BSC. CSW spoke with pt and she refuses SNF   Anticipated DC Date:  09/07/2013   Anticipated DC Plan:  [place] referral  Clinical Social Worker      DC Forensic scientist  CM consult      [facility] Choice  HOME HEALTH   Choice offered to / List presented to:  C-1 Patient        [place] arranged  [place].   Status of service:  Completed, signed off Medicare Important Message given?   (If response is "NO", the following Medicare IM given date fields will be blank) Date Medicare IM given:   Date Additional Medicare IM given:    Discharge Disposition:  ACUTE TO ACUTE TRANS  Per UR Regulation:  Reviewed for med. necessity/level of care/duration of stay  If discussed at [place] of Stay Meetings, dates discussed:    Comments:  09/08/13 [place], RN BSN CM Pt transfer to [facility] for furthur treatment.  09/07/13 [place], RN BSN CM CM spoke with pt about PT recommendations for SNF at discharge. Pt still refusing to go anywhere but home at discharge. Pt stated that her greatniece will come to stay with her for a while at discharge. Pt did agree to [facility] PT and CSW with [facility] at discharge. Romualdo Bolk of [facility] is aware and will collect pts information from the  chart.  09/07/13 Vladimir Creeks RN/CM- delayed entry from 1730 09/06/13

## 2013-09-07 NOTE — Progress Notes (Signed)
UR completed. Patient changed to inpatient- requiring IV antibiotics and IVF

## 2013-09-07 NOTE — Progress Notes (Signed)
Physical Therapy Treatment Patient Details Name: Kimberly [name] MRN: [id number] DOB: 1926-09-21 Today's Date: 09/07/2013     Upon arrival to room patient needed to use restroom;Min A required with sitting transfer to toilet. Patient was MI to stand with use of safety bar. Patient used RW for gait although patient states that at home she prefers to "furniture walk" due to her house being so small. Patient required Min A for use of RW and became weak and SOB suddenly at 81' voicing need to get back to room due to walking being very difficult due to no energy and "this being harder than she expected". Returning to seated rest patient rated Borg RPE at 9/10 which was observable. O2sats 99% HR 74 and RR24. With 5-56min seated rest patient completed seated therex and additional gait of 10' without AD to once again a BORG RPE of 9/10 and need for rest. Patient would benefit from skilled rehab to return to PLOF. Patient does not have daily help, a niece that checks on her occasionally.             Follow Up Recommendations  SNF (If patient does not increase activity tolerance by discharge)                        Mobility  Bed Mobility Overal bed mobility: Modified Independent                Transfers Overall transfer level: Needs assistance Equipment used: Rolling walker (2 wheeled) Transfers: Sit to/from Stand Sit to Stand: Min guard         General transfer comment: cues for proper technique for use of RW  Ambulation/Gait Ambulation/Gait assistance: Min guard Ambulation Distance (Feet): 60 Feet (76' with RW 10' without AD) Assistive device: Rolling walker (2 wheeled) Gait Pattern/deviations: Step-through pattern;Trunk flexed;Shuffle   Gait velocity interpretation: Below normal speed for age/gender General Gait Details: Patient c/o of difficulty of weak legs and SOB with need to turn back to room    Modified Rankin (Stroke Patients Only)       Balance                                     Cognition                            Exercises General Exercises - Upper Extremity Shoulder Extension: AROM;Both;10 reps;Seated Shoulder Horizontal ABduction: AROM;Seated;Both;10 reps Shoulder Horizontal ADduction: 10 reps;AROM;Seated;Both General Exercises - Lower Extremity Ankle Circles/Pumps: Seated;AROM;Both;10 reps Long Arc Quad: AROM;Seated;Both;10 reps Hip Flexion/Marching: AROM;Seated;Both;10 reps    General Comments             Home Living                      Prior Function            PT Goals (current goals can now be found in the care plan section) Acute Rehab PT Goals Potential to Achieve Goals: Fair Progress towards PT goals: Not progressing toward goals - comment (Patient activity tolerance is low becoming SOB and weak with little acitivity)    Frequency        End of Session Equipment Utilized During Treatment: Gait belt Activity Tolerance: Patient limited by fatigue Patient left: in chair;with call bell/phone within reach;with chair alarm set  Time: [id number] PT Time Calculation (min): 33 min  Charges:  $Gait Training: 8-22 mins $Therapeutic Activity: 8-22 mins                    G Codes:      Molli Knock 09/07/2013, 9:41 AM

## 2013-09-07 NOTE — Progress Notes (Addendum)
Patient ID: Kimberly [name], female   DOB: Nov 04, 1926, 80 y.o.   MRN: [id number] Pt tent scheduled for CT guided drainage of pelvic/diverticular abscess on 5/8 at [facility]. Imaging studies were reviewed by Dr. Vernard Gambles. Pt received eliquis dose this am- per IR protocol , pt must remain off eliquis for 48 hrs prior to planned intervention. Dr. Dyann Kief aware.

## 2013-09-07 NOTE — Progress Notes (Signed)
[facility] PROGRESS NOTE  Kimberly [name] [id number] DOB: March 16, 1927 DOA: 09/05/2013 PCP: Anthoney Harada, MD  Assessment/Plan: 1. Acute gouty arthritis.  -Swelling and pain resolved.  -will continue PRN analgesics and stop prednisone after today dose. -Base on PT evaluation/rec's patient will benefit of SNF; but patient currently declining SNF. Will arrange for [facility] services and she will be discharge home when medically stable  2. Ongoing diarrhea with recent acute diverticulitis and microperforation.  -Will continue Flagyl and meropenem -base on CT scan evaluation, patient now with colonic abscess -IR consulted for percutaneous drainage  -will follow clinical response  3. Symptomatic shortness of breath.  -SOB resolved -maybe slight exacerbation of heart failure with IVF's resuscitation; now compensated again  4. Hypokalemia.  repleted  5. Chronic atrial fibrillation, on chronic Eliquis and carvedilol.  -Her heart rate is controlled. -holding eliquis for IR percutaneous drainage  6. Status post pacemaker. Currently stable.   7. Hypertension. Controlled on carvedilol.   8. Stage II-III chronic kidney disease. Creatinine is currently stable.   9.Chronic anemia with a history of blood loss anemia in June 2014 from severe erosive reflux esophagitis and prepyloric antral ulcer. -continue niferex -continue PPI and oral B12  [id number] diastolic heart failure with mild exacerbation: now compensated and back to baseline. -will follow strict I's and O's -check daily weight  11-colonic abscess: will continue IV antibiotics and follow rec's from IR. -if patient unable to have abscess drained, will transfer to [facility] for further evaluation and treatment from surgical service. Patient is high risk for any surgical intervention here at [facility]; appreciate surgery service rec's and assistance.  DVT: SCD's for now.  Code Status: Full Family Communication: no family at  bedside  Disposition Plan: remains as inpatient; will discharge home with [facility] services when medically stable.   Consultants:  General surgery  IR  Procedures:  See below for x-ray reports   Antibiotics:  Meropenem   Flagyl   HPI/Subjective: Afebrile, complaining of mild LLQ pain and being hungry. No CP, no SOB.  Objective: Filed Vitals:   09/07/13 0606  BP: 131/64  Pulse: 70  Temp: 97.6 F (36.4 C)  Resp: 20    Intake/Output Summary (Last 24 hours) at 09/07/13 1448 Last data filed at 09/06/13 1909  Gross per 24 hour  Intake 206.92 ml  Output    200 ml  Net   6.92 ml   Filed Weights   09/05/13 1232 09/06/13 0500 09/07/13 0606  Weight: 65.3 kg (143 lb 15.4 oz) 64.5 kg (142 lb 3.2 oz) 69 kg (152 lb 1.9 oz)    Exam:   General:  Afebrile, complaining of mild LLQ pain and being hungry; afebrile   Cardiovascular: regular rate, no rubs or gallops; S1 and S2 appreciated  Respiratory: CTA bilaterally  Abdomen: mild distension, no guarding, soft, positive BS, tenderness to palpation affecting LLQ  Musculoskeletal: no edema, no cyanosis; right first metatarsal pain/swelling resolved  Data Reviewed: Basic Metabolic Panel:  Recent Labs Lab 09/05/13 0742 09/06/13 0453 09/07/13 0518  NA 137 141 141  K 2.9* 4.5 4.3  CL 99 108 108  CO2 24 22 21   GLUCOSE 112* 108* 127*  BUN 8 10 12   CREATININE 1.06 1.00 0.92  CALCIUM 8.2* 8.6 8.5  MG 1.5 1.6  --    Liver Function Tests:  Recent Labs Lab 09/06/13 0453  AST 11  ALT <5  ALKPHOS 55  BILITOT 0.3  PROT 5.9*  ALBUMIN 2.4*   CBC:  Recent Labs Lab 09/05/13 0742 09/06/13 0453 09/07/13 0518  WBC 13.9* 11.8* 11.0*  NEUTROABS 11.8*  --   --   HGB 10.1* 9.4* 9.0*  HCT 29.1* 27.1* 25.7*  MCV 88.4 89.4 89.5  PLT 428* 401* 431*   CBG: No results found for this basename: GLUCAP,  in the last 168 hours  Recent Results (from the past 240 hour(s))  CLOSTRIDIUM DIFFICILE BY PCR     Status: None    Collection Time    09/06/13  6:44 AM      Result Value Ref Range Status   C difficile by pcr NEGATIVE  NEGATIVE Final     Studies: Ct Abdomen Pelvis Wo Contrast  09/06/2013   CLINICAL DATA:  Diverticulitis with a micro perforation.  EXAM: CT ABDOMEN AND PELVIS WITHOUT CONTRAST  TECHNIQUE: Multidetector CT imaging of the abdomen and pelvis was performed following the standard protocol without IV contrast.  COMPARISON:  CT scan dated 08/24/2013  FINDINGS: There is a 4.0 x 3.8 x 3.4 cm abscess in the midline of the pelvis posterior to the bladder and immediately adjacent to the right ovary. There is air and fluid in this abscess and it communicates with the sigmoid portion of the colon at the site of the previously demonstrated micro perforation.  Pericolonic soft tissue stranding has resolved. There are multiple diverticula in the sigmoid portion of the colon.  There are no dilated loops of large or small bowel. Terminal ileum and the rest of the small bowel are normal.  The patient has a large hiatal hernia. There are multiple benign cysts in the liver, unchanged. Gallbladder has been removed. There is diffuse pancreatic atrophy, stable. There is chronic perinephric soft tissue stranding which is stable.  The patient has a severe lumbar scoliosis.  There is no free air or free fluid. The uterus has been removed. Left ovary is normal. No acute osseous abnormalities.  IMPRESSION: 1. The patient has a 4 cm abscess in the midline of the pelvis posterior to the bladder at the site of previous micro perforation. The abscess contains fluid and air and appears to communicate directly with the sigmoid portion of the colon. 2. Resolution of the pericolonic soft tissue stranding at the site of previous micro perforation. New slight presacral soft tissue edema. Critical Value/emergent results were called by telephone at the time of interpretation on 09/06/2013 at 3:58 PM to [facility], [place] , who verbally acknowledged these  results.   Electronically Signed   By: Rozetta Nunnery M.D.   On: 09/06/2013 16:01   Dg Chest Port 1 View  09/06/2013   CLINICAL DATA:  Shortness of breath.  EXAM: PORTABLE CHEST - 1 VIEW  COMPARISON:  Two-view chest 10/11/2012.  FINDINGS: The heart is enlarged. Emphysematous changes are again noted. Mild bilateral edema superimposed. No focal airspace disease is present. Pacing and defibrillator wires are stable. Calcifications and scarring at the lung apices are stable.  IMPRESSION: Mild bilateral interstitial edema is superimposed on chronic emphysematous changes.   Electronically Signed   By: Lawrence Santiago M.D.   On: 09/06/2013 13:14    Scheduled Meds: . carvedilol  6.25 mg Oral BID WC  . meropenem (MERREM) IV  1 g Intravenous Q12H  . multivitamins with iron  1 tablet Oral Daily  . pantoprazole  40 mg Oral Daily  . predniSONE  10 mg Oral BID WC  . temazepam  15 mg Oral QHS   Continuous Infusions: . dextrose 5 % and 0.9 %  NaCl with KCl 20 mEq/L 70 mL/hr at 09/06/13 1630    Time spent: >30 minutes    [facility] Pager [phone]. If 7PM-7AM, please contact night-coverage at [email or web address], password [facility] 09/07/2013, 2:48 PM  LOS: 2 days

## 2013-09-07 NOTE — Progress Notes (Signed)
Surgery  Asked to see this 10 yr. Old W. Female for diverticulitis.  She was treated in the hospital in April at which time she had a microperforation.  She did well and was discharged on PO antibiotics.  She returned to hospital for unrelated problems related to her gout and since she had some loose stools she had a CT scan that showed a diverticular abscess. That has increased since her last CT.  Clinically, however, pt's abdomen is soft without the least bit guarding or rebound and without signs of distention and no symptoms of nausea or vomiting.  Pt is hungry. She has moved her bowels.  I have discussed this pt with the med. Service and I suggest getting her off steroids and continuing IV antibiotics. Should she develop increase in abdominal symptoms would suggest IR consult to see if abscess could be drained. I furthermore think she is a high risk for surgery here and would transfer her should she need intervention. I will follow with you. I would keep her on full liquid diet.  Filed Vitals:   09/07/13 0606  BP: 131/64  Pulse: 70  Temp: 97.6 F (36.4 C)  Resp: 20    Consult dictated, dict. # [id number].

## 2013-09-08 DIAGNOSIS — F329 Major depressive disorder, single episode, unspecified: Secondary | ICD-10-CM

## 2013-09-08 DIAGNOSIS — F3289 Other specified depressive episodes: Secondary | ICD-10-CM

## 2013-09-08 LAB — CBC
HCT: 27.7 % — ABNORMAL LOW (ref 36.0–46.0)
HEMOGLOBIN: 9.3 g/dL — AB (ref 12.0–15.0)
MCH: 30.4 pg (ref 26.0–34.0)
MCHC: 33.6 g/dL (ref 30.0–36.0)
MCV: 90.5 fL (ref 78.0–100.0)
Platelets: 445 10*3/uL — ABNORMAL HIGH (ref 150–400)
RBC: 3.06 MIL/uL — ABNORMAL LOW (ref 3.87–5.11)
RDW: 15.4 % (ref 11.5–15.5)
WBC: 12.2 10*3/uL — ABNORMAL HIGH (ref 4.0–10.5)

## 2013-09-08 LAB — PROTIME-INR
INR: 1.31 (ref 0.00–1.49)
PROTHROMBIN TIME: 16 s — AB (ref 11.6–15.2)

## 2013-09-08 LAB — APTT: APTT: 40 s — AB (ref 24–37)

## 2013-09-08 LAB — BASIC METABOLIC PANEL
BUN: 12 mg/dL (ref 6–23)
CO2: 22 mEq/L (ref 19–32)
CREATININE: 0.9 mg/dL (ref 0.50–1.10)
Calcium: 8.4 mg/dL (ref 8.4–10.5)
Chloride: 107 mEq/L (ref 96–112)
GFR calc Af Amer: 66 mL/min — ABNORMAL LOW (ref >90)
GFR calc non Af Amer: 57 mL/min — ABNORMAL LOW (ref >90)
Glucose, Bld: 90 mg/dL (ref 70–99)
POTASSIUM: 4.2 meq/L (ref 3.7–5.3)
SODIUM: 140 meq/L (ref 137–147)

## 2013-09-08 MED ORDER — WHITE PETROLATUM GEL
Status: AC
  Administered 2013-09-08: 22:00:00
  Filled 2013-09-08: qty 5

## 2013-09-08 NOTE — Progress Notes (Signed)
Surgery  Discussed case with med. Service.  The plan is to consult IR for percutaneous drainage. She will be transferred. Will sign off.   Clinically the pt. Is more tender in LLQ, much more so than yesterday and agree with transfer today.  Filed Vitals:   09/08/13 0444  BP: 136/58  Pulse: 81  Temp: 97.3 F (36.3 C)  Resp: 20

## 2013-09-08 NOTE — Progress Notes (Signed)
Report called to M.Cranford,RN at [facility]. Nurse notified that [facility] is on their way.

## 2013-09-08 NOTE — Consult Note (Signed)
NAME:  Kimberly [name], Kimberly [name]                 ACCOUNT NO.:  [id number]  MEDICAL RECORD NO.:  [id number]  LOCATION:  A315                          FACILITY:  [facility]  PHYSICIAN:  Felicie Morn, M.D. DATE OF BIRTH:  May 26, 1926  DATE OF CONSULTATION:  09/07/2013 DATE OF DISCHARGE:                                CONSULTATION   NOTE:  Surgery was asked to see this 80 year old white female for diverticular disease.  HISTORY OF PRESENT MEDICAL ILLNESS:  This patient was treated in the hospital for diverticulitis during the latter part of April, at which time, she was noted to have a micro perforation.  She was treated with parenteral antibiotics at that time and discharged on p.o. antibiotics, and she did well.  She was readmitted to the hospital for an unrelated problem.  She developed difficulty with her gout and was unable to ambulate well, and she was brought into the hospital for that reason. The antibiotics, she thought, gave her some loose stools, and she apparently did not have any trouble with Clostridium difficile colitis, but a CT scan was performed, at which time, they found that the inflammation had progressed around her colon.  She had an abscess that had enlarged from her previous CT scan.  She was admitted and Surgery was consulted.  Clinically, it is interesting that the patient's abdomen is completely soft without the least bit of guarding or rebound, and she has no signs of distention and no symptoms of nausea or vomiting, and the patient is actually hungry.  She has moved her bowels and she actually has no GI complaints other than the fact that she is hungry.  Bowel sounds are also normoactive and she has had semi-solid stools since her admission.  PHYSICAL EXAMINATION:  VITAL SIGNS:  She is about 5 foot 5 inches and weighs 152 pounds.  Her temperature is 97.6, her pulse rate is 70 and irregular, respirations are 20, blood pressure 131/64, O2 sat 98% on room air. HEENT:  Head  is normocephalic.  Eyes, extraocular movements are intact. Pupils are round and reactive to light and accommodation.  There is some conjunctival pallor noted.  Nose and oral mucosa are moist, however. NECK:  Supple and cylindrical.  She has no jugular vein distention, thyromegaly, tracheal deviation, or cervical adenopathy, and no bruits are appreciated. CHEST:  Clear to both anterior and posterior auscultation. HEART:  Irregular rhythm.  She has a pacemaker placed in the left hemithorax subcutaneously. BREASTS:  She has had previous left breast surgery.  The breasts are without masses and the axilla is without masses. ABDOMEN:  Somewhat globus, but nondistended and nontender as mentioned with bowel sounds normoactive and no femoral or inguinal hernias.  She has a midline surgical incision, through which she has had a previous gallbladder surgery as well as surgery for perforated appendicitis. Other surgery has been a vaginal hysterectomy in the past.  She has also had neck surgery and back surgery as well. EXTREMITIES:  The patient has had some podagra on her left side from her gout and she is improving from this.  REVIEW OF SYSTEMS:  OB/GYN HISTORY:  She is gravida 5, para 2, abortus  3 female who has had no family history of carcinoma of the breast.  She had left breast biopsy for benign disease and her last mammogram she says was 3 or 4 years ago.  She has had a previous vaginal hysterectomy. GI SYSTEM:  She has had no past history of hepatitis.  She has had past history of diverticular disease as mentioned above, this has been a fairly recent history.  She has had some loose stools, this has not been a problem during this admission so far, although that is one of the things that she mentioned when she came to the emergency room.  She has no past history of inflammatory bowel disease or irritable bowel syndrome.  No history of unexplained weight loss.  The patient apparently has had  a history of an antral ulcer and erosive esophagitis. CARDIOPULMONARY SYSTEM:  The patient has a history of hypertension.  She has a history of atrial fib, for which she is on chronic anticoagulation.  She is also anemic.  She is a nonsmoker, nondrinker. She had a pacemaker placed.  MUSCULOSKELETAL:  She has a scoliosis.  She has osteoarthritis.  She has gouty arthritis.  She has fibromyalgia and her gouty arthritis has brought her to the hospital at this particular time and she has been treated with steroids with that.  NEUROLOGIC:  The patient seems very alert for her age.  She has a history of depression. ENDOCRINE SYSTEM:  No history of diabetes, thyroid disease, or adrenal problems.  SOCIAL HISTORY:  The patient is an elderly lady who lives alone and she is retired from where she used to work in [place].  ALLERGIES:  As stated, she has a history of allergies to IODINE and PENICILLIN.  MEDICATIONS:  For other medications, please review the medical list.  REVIEW OF HISTORY AND PHYSICAL:  Therefore, this 80 year old white female will be treated with parenteral antibiotics for her diverticular disease.  Because of her multiple medical problems, I would suggest transfer should this become more symptomatic.  At present, she seems to be responding to the treatment and her abdomen is really quite soft and benign.  I would endeavor to get her off the steroids as soon as possible, however.  I discussed this with the Medical Service and I will follow along with her from a surgical point of view.     Felicie Morn, M.D.     WB/MEDQ  D:  09/07/2013  T:  09/08/2013  Job:  [id number]

## 2013-09-08 NOTE — Progress Notes (Signed)
[facility] PROGRESS NOTE  Kimberly [name] [id number] DOB: Sep 30, 1926 DOA: 09/05/2013 PCP: Anthoney Harada, MD  Assessment/Plan: 1. Acute gouty arthritis.  -Swelling and pain resolved.  -will continue PRN analgesics. -Base on PT evaluation/rec's patient will benefit of SNF; but patient currently declining SNF. Will arrange for [facility] services and she will be discharge home when medically stable  2. Ongoing diarrhea with recent acute diverticulitis and microperforation.  -Will continue Flagyl and meropenem -base on CT scan evaluation, patient now with colonic abscess; IR consulted for percutaneous drainage on 5/8 -will follow clinical response -if needed will consult [place] [facility] surgery  3. Symptomatic shortness of breath.  -SOB resolved -maybe slight exacerbation of heart failure with IVF's resuscitation; now compensated again. -will monitor.  4. Hypokalemia.  -repleted and WNL  5. Chronic atrial fibrillation, on chronic Eliquis and carvedilol.  -Her heart rate is controlled. -holding eliquis for IR percutaneous drainage tomorow  6. Status post pacemaker. Currently stable.   7. Hypertension. Controlled on carvedilol.   8. Stage II-III chronic kidney disease. Creatinine is currently stable.   9.Chronic anemia with a history of blood loss anemia in June 2014 from severe erosive reflux esophagitis and prepyloric antral ulcer. -continue niferex -continue PPI and oral B12 -no signs of overt bleeding  [id number] diastolic heart failure with mild exacerbation: now compensated and back to baseline. -will follow strict I's and O's -check daily weight  11-colonic abscess: will continue IV antibiotics and will transfer to [facility] for IR percutaneous drainage, closer follow up and if needed surgery service consultation. follow rec's from IR.  DVT: SCD's for now.  Code Status: Full Family Communication: no family at bedside  Disposition Plan: remains as inpatient; will  discharge home with [facility] services when medically stable.   Consultants:  General surgery  IR  Procedures:  See below for x-ray reports   Antibiotics:  Meropenem   Flagyl   HPI/Subjective: Afebrile, continue to complaints of mild LLQ pain; positive BM in am. No CP, no SOB, no N/V.  Objective: Filed Vitals:   09/08/13 0444  BP: 136/58  Pulse: 81  Temp: 97.3 F (36.3 C)  Resp: 20    Intake/Output Summary (Last 24 hours) at 09/08/13 1252 Last data filed at 09/08/13 0610  Gross per 24 hour  Intake    240 ml  Output   2100 ml  Net  -1860 ml   Filed Weights   09/06/13 0500 09/07/13 0606 09/08/13 0444  Weight: 64.5 kg (142 lb 3.2 oz) 69 kg (152 lb 1.9 oz) 65.6 kg (144 lb 10 oz)    Exam:   General:  Afebrile, still complaining of mild LLQ pain and being hungry; afebrile   Cardiovascular: regular rate, no rubs or gallops; S1 and S2 appreciated  Respiratory: CTA bilaterally  Abdomen: mild distension, no guarding, soft, positive BS, tenderness to palpation affecting LLQ  Musculoskeletal: no edema, no cyanosis; right first metatarsal pain/swelling resolved  Data Reviewed: Basic Metabolic Panel:  Recent Labs Lab 09/05/13 0742 09/06/13 0453 09/07/13 0518 09/08/13 0440  NA 137 141 141 140  K 2.9* 4.5 4.3 4.2  CL 99 108 108 107  CO2 24 22 21 22   GLUCOSE 112* 108* 127* 90  BUN 8 10 12 12   CREATININE 1.06 1.00 0.92 0.90  CALCIUM 8.2* 8.6 8.5 8.4  MG 1.5 1.6  --   --    Liver Function Tests:  Recent Labs Lab 09/06/13 0453  AST 11  ALT <5  ALKPHOS 55  BILITOT 0.3  PROT 5.9*  ALBUMIN 2.4*   CBC:  Recent Labs Lab 09/05/13 0742 09/06/13 0453 09/07/13 0518 09/08/13 0440  WBC 13.9* 11.8* 11.0* 12.2*  NEUTROABS 11.8*  --   --   --   HGB 10.1* 9.4* 9.0* 9.3*  HCT 29.1* 27.1* 25.7* 27.7*  MCV 88.4 89.4 89.5 90.5  PLT 428* 401* 431* 445*   CBG: No results found for this basename: GLUCAP,  in the last 168 hours  Recent Results (from the past  240 hour(s))  CLOSTRIDIUM DIFFICILE BY PCR     Status: None   Collection Time    09/06/13  6:44 AM      Result Value Ref Range Status   C difficile by pcr NEGATIVE  NEGATIVE Final     Studies: Ct Abdomen Pelvis Wo Contrast  09/06/2013   CLINICAL DATA:  Diverticulitis with a micro perforation.  EXAM: CT ABDOMEN AND PELVIS WITHOUT CONTRAST  TECHNIQUE: Multidetector CT imaging of the abdomen and pelvis was performed following the standard protocol without IV contrast.  COMPARISON:  CT scan dated 08/24/2013  FINDINGS: There is a 4.0 x 3.8 x 3.4 cm abscess in the midline of the pelvis posterior to the bladder and immediately adjacent to the right ovary. There is air and fluid in this abscess and it communicates with the sigmoid portion of the colon at the site of the previously demonstrated micro perforation.  Pericolonic soft tissue stranding has resolved. There are multiple diverticula in the sigmoid portion of the colon.  There are no dilated loops of large or small bowel. Terminal ileum and the rest of the small bowel are normal.  The patient has a large hiatal hernia. There are multiple benign cysts in the liver, unchanged. Gallbladder has been removed. There is diffuse pancreatic atrophy, stable. There is chronic perinephric soft tissue stranding which is stable.  The patient has a severe lumbar scoliosis.  There is no free air or free fluid. The uterus has been removed. Left ovary is normal. No acute osseous abnormalities.  IMPRESSION: 1. The patient has a 4 cm abscess in the midline of the pelvis posterior to the bladder at the site of previous micro perforation. The abscess contains fluid and air and appears to communicate directly with the sigmoid portion of the colon. 2. Resolution of the pericolonic soft tissue stranding at the site of previous micro perforation. New slight presacral soft tissue edema. Critical Value/emergent results were called by telephone at the time of interpretation on 09/06/2013  at 3:58 PM to [place], [place] , who verbally acknowledged these results.   Electronically Signed   By: Rozetta Nunnery M.D.   On: 09/06/2013 16:01    Scheduled Meds: . carvedilol  6.25 mg Oral BID WC  . meropenem (MERREM) IV  1 g Intravenous Q12H  . multivitamins with iron  1 tablet Oral Daily  . pantoprazole  40 mg Oral Daily  . temazepam  15 mg Oral QHS  . vitamin B-12  1,000 mcg Oral Daily   Continuous Infusions: . dextrose 5 % and 0.9 % NaCl with KCl 20 mEq/L 70 mL/hr at 09/06/13 1630    Time spent: >30 minutes    [facility] Pager [phone]. If 7PM-7AM, please contact night-coverage at [email or web address], password [facility] 09/08/2013, 12:52 PM  LOS: 3 days

## 2013-09-09 ENCOUNTER — Inpatient Hospital Stay (HOSPITAL_COMMUNITY): Payer: Medicare Other

## 2013-09-09 ENCOUNTER — Encounter (HOSPITAL_COMMUNITY): Payer: Self-pay | Admitting: General Surgery

## 2013-09-09 DIAGNOSIS — D72829 Elevated white blood cell count, unspecified: Secondary | ICD-10-CM

## 2013-09-09 DIAGNOSIS — K5732 Diverticulitis of large intestine without perforation or abscess without bleeding: Secondary | ICD-10-CM

## 2013-09-09 DIAGNOSIS — Z7901 Long term (current) use of anticoagulants: Secondary | ICD-10-CM

## 2013-09-09 MED ORDER — MIDAZOLAM HCL 2 MG/2ML IJ SOLN
INTRAMUSCULAR | Status: AC | PRN
  Administered 2013-09-09 (×2): 1 mg via INTRAVENOUS

## 2013-09-09 MED ORDER — MIDAZOLAM HCL 2 MG/2ML IJ SOLN
INTRAMUSCULAR | Status: AC
  Filled 2013-09-09: qty 4

## 2013-09-09 MED ORDER — HYDROCODONE-ACETAMINOPHEN 5-325 MG PO TABS
1.0000 | ORAL_TABLET | ORAL | Status: DC | PRN
  Administered 2013-09-09 – 2013-09-11 (×5): 2 via ORAL
  Filled 2013-09-09 (×5): qty 2

## 2013-09-09 MED ORDER — FENTANYL CITRATE 0.05 MG/ML IJ SOLN
INTRAMUSCULAR | Status: AC
  Filled 2013-09-09: qty 4

## 2013-09-09 MED ORDER — FENTANYL CITRATE 0.05 MG/ML IJ SOLN
INTRAMUSCULAR | Status: AC | PRN
  Administered 2013-09-09: 25 ug via INTRAVENOUS
  Administered 2013-09-09: 50 ug via INTRAVENOUS
  Administered 2013-09-09: 25 ug via INTRAVENOUS

## 2013-09-09 MED ORDER — LIDOCAINE HCL 1 % IJ SOLN
INTRAMUSCULAR | Status: AC
  Filled 2013-09-09: qty 10

## 2013-09-09 NOTE — Progress Notes (Signed)
Patient active with [facility] services. Called into patient's room to make aware that [facility] will continue to follow. She will receive post hospital discharge calls and will be evaluated for monthly home visits upon returning home. If patient goes home and not SNF, she could also benefit from home health services as well. It appears the home health process began at [facility]. [facility] was chosen. Made inpatient RNCM aware of the above.Will continue to follow. Marthenia Rolling, MSN-RN,BSN- [facility] [phone]

## 2013-09-09 NOTE — Progress Notes (Signed)
[facility] PROGRESS NOTE Assessment/Plan: Acute gouty arthritis:  - Swelling and pain resolved.  - will continue PRN analgesics.  - Base on PT evaluation/rec's patient will benefit of SNF; but patient currently declining SNF.  - Pt at home  Colonic abscess: - will continue IV antibiotics and will transfer to [facility] for IR percutaneous drainage, closer follow up and if needed surgery service consultation.  - s/p percutaneous drainage.  Ongoing diarrhea with recent acute diverticulitis and microperforation: - Will continue  meropenem, leukocytosis not improving. - Base on CT scan evaluation, patient now with colonic abscess; IR consulted for percutaneous drainage on 5/8  - Will follow clinical response  - If needed will consult [place] general surgery. - ? When  To restart Eliquis.  Symptomatic shortness of breath:  - SOB resolved  - maybe slight exacerbation of heart failure with IVF's resuscitation; now compensated again.  - Will monitor.   Hypokalemia: - Repleted and WNL   Chronic atrial fibrillation: - On chronic Eliquis and carvedilol.  - Her heart rate is controlled.  - Holding eliquis for IR percutaneous drainage tomorow    Status post pacemaker: - Currently stable.   Hypertension: - Controlled on carvedilol.   Stage II-III chronic kidney disease: -  Creatinine is currently stable.   Chronic anemia  - With a history of blood loss anemia in June 2014 from severe erosive reflux esophagitis and prepyloric antral ulcer.  - Continue niferex  - Continue PPI and oral B12  - No signs of overt bleeding.  Chronic diastolic heart failure with mild exacerbation: now compensated and back to baseline.  - Will follow strict I's and O's  - Check daily weight     Code Status: Full  Family Communication: no family at bedside  Disposition Plan: remains as inpatient; will discharge home with [facility] services when medically stable.    Consultants:  General  surgey  Procedures:  IR  Antibiotics:  Meropenem   flagyl  HPI/Subjective: Still having abd pain  Objective: Filed Vitals:   09/09/13 1256 09/09/13 1301 09/09/13 1305 09/09/13 1323  BP: 122/52 121/55 122/55 126/51  Pulse: 70 70 70 70  Temp:      TempSrc:      Resp: 21 17 17 18   Height:      Weight:      SpO2: 99% 98% 94% 97%    Intake/Output Summary (Last 24 hours) at 09/09/13 1427 Last data filed at 09/09/13 1319  Gross per 24 hour  Intake      0 ml  Output    303 ml  Net   -303 ml   Filed Weights   09/07/13 0606 09/08/13 0444 09/09/13 0612  Weight: 69 kg (152 lb 1.9 oz) 65.6 kg (144 lb 10 oz) 66.9 kg (147 lb 7.8 oz)    Exam:  General: Alert, awake, oriented x3, in no acute distress.  HEENT: No bruits, no goiter.  Heart: Regular rate and rhythm, without murmurs, rubs, gallops.  Lungs: Good air movement, clear Abdomen: Soft,  tenderness, positive bowel sounds.    Data Reviewed: Basic Metabolic Panel:  Recent Labs Lab 09/05/13 0742 09/06/13 0453 09/07/13 0518 09/08/13 0440  NA 137 141 141 140  K 2.9* 4.5 4.3 4.2  CL 99 108 108 107  CO2 24 22 21 22   GLUCOSE 112* 108* 127* 90  BUN 8 10 12 12   CREATININE 1.06 1.00 0.92 0.90  CALCIUM 8.2* 8.6 8.5 8.4  MG 1.5 1.6  --   --  Liver Function Tests:  Recent Labs Lab 09/06/13 0453  AST 11  ALT <5  ALKPHOS 55  BILITOT 0.3  PROT 5.9*  ALBUMIN 2.4*   No results found for this basename: LIPASE, AMYLASE,  in the last 168 hours No results found for this basename: AMMONIA,  in the last 168 hours CBC:  Recent Labs Lab 09/05/13 0742 09/06/13 0453 09/07/13 0518 09/08/13 0440  WBC 13.9* 11.8* 11.0* 12.2*  NEUTROABS 11.8*  --   --   --   HGB 10.1* 9.4* 9.0* 9.3*  HCT 29.1* 27.1* 25.7* 27.7*  MCV 88.4 89.4 89.5 90.5  PLT 428* 401* 431* 445*   Cardiac Enzymes: No results found for this basename: CKTOTAL, CKMB, CKMBINDEX, TROPONINI,  in the last 168 hours BNP (last 3 results) No results found  for this basename: PROBNP,  in the last 8760 hours CBG: No results found for this basename: GLUCAP,  in the last 168 hours  Recent Results (from the past 240 hour(s))  CLOSTRIDIUM DIFFICILE BY PCR     Status: None   Collection Time    09/06/13  6:44 AM      Result Value Ref Range Status   C difficile by pcr NEGATIVE  NEGATIVE Final     Studies: No results found.  Scheduled Meds: . carvedilol  6.25 mg Oral BID WC  . fentaNYL      . lidocaine      . meropenem (MERREM) IV  1 g Intravenous Q12H  . midazolam      . multivitamins with iron  1 tablet Oral Daily  . pantoprazole  40 mg Oral Daily  . temazepam  15 mg Oral QHS  . vitamin B-12  1,000 mcg Oral Daily   Continuous Infusions:    Charlynne Cousins  [facility] Pager [phone].  If 8PM-8AM, please contact night-coverage at [email or web address], password [facility] 09/09/2013, 2:27 PM  LOS: 4 days

## 2013-09-09 NOTE — Progress Notes (Signed)
Notified on call for [facility] to clarify NPO status. The order for NPO status was d/c'd when patient arrived on floor. On call said to keep patient on clear liquids.

## 2013-09-09 NOTE — Progress Notes (Signed)
Pt transferred from [facility]. CSW aware of PT recommendation, however, CSW at [facility] completed assessment with pt and signed off as pt is refusing SNF at this time. RN CM is aware of this. Please reconsult should new social work needs arise.  [place], [place]

## 2013-09-09 NOTE — Progress Notes (Signed)
ANTIBIOTIC CONSULT NOTE - FOLLOW UP  Pharmacy Consult for Meropenem Indication: intra-abdominal infection  Allergies  Allergen Reactions  . Iodine Rash and Other (See Comments)    REACTION:If injected,  Rash/irritated skin reaction "welts"  . Penicillins Hives  . Sulfa Antibiotics Rash    Patient Measurements: Height: 5\' 7"  (170.2 cm) Weight: 147 lb 7.8 oz (66.9 kg) IBW/kg (Calculated) : 61.6  Vital Signs: Temp: 98.9 F (37.2 C) (05/08 0520) Temp src: Oral (05/08 0520) BP: 126/51 mmHg (05/08 1323) Pulse Rate: 70 (05/08 1323) Intake/Output from previous day: 05/07 0701 - 05/08 0700 In: 400 [IV Piggyback:400] Out: 850 [Urine:850] Intake/Output from this shift: Total I/O In: 0  Out: 3 [Drains:3]  Labs:  Recent Labs  09/07/13 0518 09/08/13 0440  WBC 11.0* 12.2*  HGB 9.0* 9.3*  PLT 431* 445*  CREATININE 0.92 0.90   Estimated Creatinine Clearance: 45.2 ml/min (by C-G formula based on Cr of 0.9).  Microbiology: Recent Results (from the past 720 hour(s))  CLOSTRIDIUM DIFFICILE BY PCR     Status: None   Collection Time    09/06/13  6:44 AM      Result Value Ref Range Status   C difficile by pcr NEGATIVE  NEGATIVE Final    Anti-infectives   Start     Dose/Rate Route Frequency Ordered Stop   09/06/13 1800  meropenem (MERREM) 1 g in sodium chloride 0.9 % 100 mL IVPB     1 g 200 mL/hr over 30 Minutes Intravenous Every 12 hours 09/06/13 1629     09/05/13 1600  metroNIDAZOLE (FLAGYL) tablet 500 mg  Status:  Discontinued     500 mg Oral 3 times daily 09/05/13 1257 09/07/13 0833      Assessment:  Day # 4 Meropenem. Flagyl discontinued on 5/6, with C diff PCR negative on 5/5.  S/p US-guided percutaneous drain today.  Culture sent. Afebrile, WBC 12.2 on 5/7.  Renal function stable.  Goal of Therapy:  appropriate Meropenem dose for renal function and infection  Plan:   Continue Meropemen 1 gram IV q12hrs.  Follow renal function, culture data and clinical  progress.   Eliquis has been on hold for procedure.  Will follow up for restarting.    Arty Baumgartner, [place] Pager: [phone] 09/09/2013,2:31 PM

## 2013-09-09 NOTE — Procedures (Signed)
CT guided 46F perc abscess drain 66ml purulent aspirate sent for GS, C&S No complication No blood loss. See complete dictation in [facility].

## 2013-09-09 NOTE — Progress Notes (Signed)
PT Cancellation Note  Patient Details Name: Kimberly [name] MRN: [id number] DOB: Jun 30, 1926   Cancelled Treatment:    Reason Eval/Treat Not Completed: Pain limiting ability to participate. Patient refused therapy this AM. Stated that she was in too much pain and she just wanted to leave. Attempting to have patient at least work on getting out of bed to recliner however patient getting upset and continued to refused. Patient stated her great niece will help her when she gets home.    Tonia Brooms [name] 09/09/2013, 9:12 AM

## 2013-09-09 NOTE — H&P (Signed)
Kimberly [name] is an 80 y.o. female.   Chief Complaint: Pt has had abd pain; Nausea x 1 mo CT scan 4/22 showed diverticulitis without abscess Continued pain; fever Repeat CT 5/5 now reveals pelvic abscess Scheduled now for abscess drain Pt has been transferred to [facility] from [facility] for procedure and treatment  HPI: Afib; pacemaker; HTN; glaucoma; diverticulitis INR 1.3 5/7  Past Medical History  Diagnosis Date  . Depression   . Atrial fibrillation   . Scoliosis   . Osteoarthritis   . Pacemaker   . Fibromyalgia   . Hypertension   . Glaucoma   . Acute diverticulitis 08/24/2013  . CKD (chronic kidney disease) stage 3, GFR 30-59 ml/min 10/12/2012  . Chronic anticoagulation 10/12/2012  . Acute blood loss anemia 10/11/2012  . Antral ulcer 10/11/2012  . Erosive esophagitis 10/11/2012    Past Surgical History  Procedure Laterality Date  . Pacemaker insertion    . Abdominal hysterectomy    . Cholecystectomy    . Appendectomy    . Breast surgery    . Back surgery    . Neck surgery    . Hernia repair      right inguinal hernia and umbilical  . Tonsillectomy    . Esophagogastroduodenoscopy N/A 10/13/2012    Procedure: ESOPHAGOGASTRODUODENOSCOPY (EGD);  Surgeon: Daneil Dolin, MD;  Location: [facility] ENDO SUITE;  Service: Endoscopy;  Laterality: N/A;  . Cystoscopy N/A 02/24/2013    Procedure: CYSTOSCOPY WITH URETHRAL DILITATION;  Surgeon: Marissa Nestle, MD;  Location: [facility] ORS;  Service: Urology;  Laterality: N/A;    Family History  Problem Relation Age of Onset  . Cancer Sister   . Asthma Sister   . Heart failure Brother   . Colon cancer Neg Hx    Social History:  reports that she quit smoking about 8 years ago. Her smoking use included Cigarettes. She has a 15 pack-year smoking history. She does not have any smokeless tobacco history on file. She reports that she drinks about .6 ounces of alcohol per week. She reports that she does not use illicit drugs.  Allergies:  Allergies  Allergen  Reactions  . Iodine Rash and Other (See Comments)    REACTION:If injected,  Rash/irritated skin reaction "welts"  . Penicillins Hives  . Sulfa Antibiotics Rash    Medications Prior to Admission  Medication Sig Dispense Refill  . apixaban (ELIQUIS) 2.5 MG TABS tablet Take 2.5 mg by mouth 2 (two) times daily.      . carvedilol (COREG) 6.25 MG tablet Take 1 tablet (6.25 mg total) by mouth 2 (two) times daily with a meal.  180 tablet  1  . ciprofloxacin (CIPRO) 500 MG tablet Take 1 tablet (500 mg total) by mouth 2 (two) times daily.  20 tablet  0  . Doxepin HCl 3 MG TABS Take 1 tablet by mouth at bedtime.      . furosemide (LASIX) 20 MG tablet Take 2 tablets by mouth  daily  30 tablet  0  . HYDROcodone-acetaminophen (NORCO/VICODIN) 5-325 MG per tablet Take 1-2 tablets by mouth every 6 (six) hours as needed for moderate pain.  30 tablet  0  . metroNIDAZOLE (FLAGYL) 500 MG tablet Take 1 tablet (500 mg total) by mouth 3 (three) times daily.  30 tablet  0  . omeprazole (PRILOSEC) 20 MG capsule Take 20 mg by mouth daily.      . polyethylene glycol (MIRALAX) packet Take 17 g by mouth daily as needed.  14 each  0    Results for orders placed during the hospital encounter of 09/05/13 (from the past 48 hour(s))  CBC     Status: Abnormal   Collection Time    09/08/13  4:40 AM      Result Value Ref Range   WBC 12.2 (*) 4.0 - 10.5 K/uL   RBC 3.06 (*) 3.87 - 5.11 MIL/uL   Hemoglobin 9.3 (*) 12.0 - 15.0 g/dL   HCT 27.7 (*) 36.0 - 46.0 %   MCV 90.5  78.0 - 100.0 fL   MCH 30.4  26.0 - 34.0 pg   MCHC 33.6  30.0 - 36.0 g/dL   RDW 15.4  11.5 - 15.5 %   Platelets 445 (*) 150 - 400 K/uL  BASIC METABOLIC PANEL     Status: Abnormal   Collection Time    09/08/13  4:40 AM      Result Value Ref Range   Sodium 140  137 - 147 mEq/L   Potassium 4.2  3.7 - 5.3 mEq/L   Chloride 107  96 - 112 mEq/L   CO2 22  19 - 32 mEq/L   Glucose, Bld 90  70 - 99 mg/dL   BUN 12  6 - 23 mg/dL   Creatinine, Ser 0.90  0.50 -  1.10 mg/dL   Calcium 8.4  8.4 - 10.5 mg/dL   GFR calc non Af Amer 57 (*) >90 mL/min   GFR calc Af Amer 66 (*) >90 mL/min   Comment: (NOTE)     The eGFR has been calculated using the CKD EPI equation.     This calculation has not been validated in all clinical situations.     eGFR's persistently <90 mL/min signify possible Chronic Kidney     Disease.  PROTIME-INR     Status: Abnormal   Collection Time    09/08/13  4:40 AM      Result Value Ref Range   Prothrombin Time 16.0 (*) 11.6 - 15.2 seconds   INR 1.31  0.00 - 1.49  APTT     Status: Abnormal   Collection Time    09/08/13  4:40 AM      Result Value Ref Range   aPTT 40 (*) 24 - 37 seconds   Comment:            IF BASELINE aPTT IS ELEVATED,     SUGGEST PATIENT RISK ASSESSMENT     BE USED TO DETERMINE APPROPRIATE     ANTICOAGULANT THERAPY.   No results found.  Review of Systems  Constitutional: Positive for weight loss. Negative for fever.  Respiratory: Negative for shortness of breath.   Cardiovascular: Negative for chest pain.  Gastrointestinal: Positive for nausea and abdominal pain. Negative for vomiting.  Neurological: Positive for weakness.    Blood pressure 130/53, pulse 80, temperature 98.9 F (37.2 C), temperature source Oral, resp. rate 18, height 5' 7"  (1.702 m), weight 66.9 kg (147 lb 7.8 oz), SpO2 95.00%. Physical Exam  Constitutional: She is oriented to person, place, and time. She appears well-developed.  Cardiovascular: Normal rate and regular rhythm.   No murmur heard. Respiratory: Effort normal. She has no wheezes.  GI: Soft. There is tenderness.  Musculoskeletal: Normal range of motion.  Neurological: She is alert and oriented to person, place, and time.  Skin: Skin is warm and dry.  Psychiatric: She has a normal mood and affect. Her behavior is normal. Judgment and thought content normal.     Assessment/Plan  Diverticular pelvic abscess Scheduled for abscess drain placement Pt aware of procedure  benefits and risks and agreeable to proceed Consent signed and in chart  Lavonia Drafts 09/09/2013, 9:52 AM

## 2013-09-09 NOTE — Consult Note (Signed)
Kimberly [name] [birth year]  [id number].    Requesting MD: Dr. Aileen Fass Chief Complaint/Reason for Consult: Diverticulitis with abscess  HPI:  80 y.o. female with a history of atrial fibrillation on chronic Eliquis, s/p pacemaker, HTN, and recent hospitalization for acute diverticulitis with microperf (08/24/13 - 08/28/13).  She was treated with Cipro/flagyl.  She presented to [facility] with a chief complaint of right great toe pain and redness.  The pain was so severe she wasn't able to ambulate.  It was found to be gouty arthritis.  However, after admission she noted lots of diarrhea (C.diff negative) for several days which she related to the antibiotics.  She complained of worsening abdominal pain in the RLQ/LLQ and suprapubic region.  No radiating pain.  Repeat CT scan was obtained which showed a worsening abscess compared to the last CT scan.  She was transferred from [facility] to [facility] for further evaluation and management.  She took cipro/flagyl between 08/24/13 to 09/05/13 (took 10 days PO as an OP).  She got 2 days of flagyl on 5/4-5/5 but no cipro.  She was started on Meropenem on 09/06/13 and is currently on it.  IR was consulted and have completed a CT guided perc abscess drain, cultures/GS are pending.  She currently complains of continued diarrhea, RLQ<LLQ abdominal pain, minimal right toe pain.  No alleviating or aggravating factors.  She denies N/V, fever/chills, headache, weakness, CP/SOB, problems with other extremities.     ROS: All systems reviewed and otherwise negative except for as above  Family History  Problem Relation Age of Onset  . Cancer Sister   . Asthma Sister   . Heart failure Brother   . Colon cancer Neg Hx     Past Medical History  Diagnosis Date  . Depression   . Atrial fibrillation   . Scoliosis   . Osteoarthritis   . Pacemaker   . Fibromyalgia   . Hypertension   . Glaucoma   . Acute diverticulitis 08/24/2013  . CKD (chronic kidney disease) stage 3, GFR 30-59  ml/min 10/12/2012  . Chronic anticoagulation 10/12/2012  . Acute blood loss anemia 10/11/2012  . Antral ulcer 10/11/2012  . Erosive esophagitis 10/11/2012    Past Surgical History  Procedure Laterality Date  . Pacemaker insertion    . Abdominal hysterectomy    . Cholecystectomy    . Appendectomy    . Breast surgery    . Back surgery    . Neck surgery    . Hernia repair      right inguinal hernia and umbilical  . Tonsillectomy    . Esophagogastroduodenoscopy N/A 10/13/2012    Procedure: ESOPHAGOGASTRODUODENOSCOPY (EGD);  Surgeon: Daneil Dolin, MD;  Location: [facility];  Service: Endoscopy;  Laterality: N/A;  . Cystoscopy N/A 02/24/2013    Procedure: CYSTOSCOPY WITH URETHRAL DILITATION;  Surgeon: Marissa Nestle, MD;  Location: [facility] ORS;  Service: Urology;  Laterality: N/A;    Social History:  reports that she quit smoking about 8 years ago. Her smoking use included Cigarettes. She has a 15 pack-year smoking history. She does not have any smokeless tobacco history on file. She reports that she drinks about .6 ounces of alcohol per week. She reports that she does not use illicit drugs.  Allergies:  Allergies  Allergen Reactions  . Iodine Rash and Other (See Comments)    REACTION:If injected,  Rash/irritated skin reaction "welts"  . Penicillins Hives  . Sulfa Antibiotics Rash    Medications Prior  to Admission  Medication Sig Dispense Refill  . apixaban (ELIQUIS) 2.5 MG TABS tablet Take 2.5 mg by mouth 2 (two) times daily.      . carvedilol (COREG) 6.25 MG tablet Take 1 tablet (6.25 mg total) by mouth 2 (two) times daily with a meal.  180 tablet  1  . ciprofloxacin (CIPRO) 500 MG tablet Take 1 tablet (500 mg total) by mouth 2 (two) times daily.  20 tablet  0  . Doxepin HCl 3 MG TABS Take 1 tablet by mouth at bedtime.      . furosemide (LASIX) 20 MG tablet Take 2 tablets by mouth  daily  30 tablet  0  . HYDROcodone-acetaminophen (NORCO/VICODIN) 5-325 MG per tablet Take 1-2 tablets  by mouth every 6 (six) hours as needed for moderate pain.  30 tablet  0  . metroNIDAZOLE (FLAGYL) 500 MG tablet Take 1 tablet (500 mg total) by mouth 3 (three) times daily.  30 tablet  0  . omeprazole (PRILOSEC) 20 MG capsule Take 20 mg by mouth daily.      . polyethylene glycol (MIRALAX) packet Take 17 g by mouth daily as needed.  14 each  0    Blood pressure 116/54, pulse 70, temperature 97.4 F (36.3 C), temperature source Oral, resp. rate 18, height 5' 7"  (1.702 m), weight 147 lb 7.8 oz (66.9 kg), SpO2 100.00%. Physical Exam: General: pleasant, WD/WN white female who is laying in bed in NAD HEENT: head is normocephalic, atraumatic.  Sclera are noninjected.  PERRL.  Ears and nose without any masses or lesions.  Mouth is pink and moist Heart: regular, rate, and rhythm.  No obvious murmurs, gallops, or rubs noted.  Palpable pedal pulses bilaterally Lungs: CTAB, no wheezes, rhonchi, or rales noted.  Respiratory effort nonlabored Abd: soft, quite tender in the suprapubic<LLQ<RLQ, no rebound or guarding, +BS, no masses, hernias, or organomegaly, abdominal scars noted MS: all 4 extremities are symmetrical with no cyanosis, clubbing, or edema.  Right first toe with mild erythema and noted bunion Skin: warm and dry with no masses, lesions, or rashes Psych: A&Ox3 with an appropriate affect.   Results for orders placed during the hospital encounter of 09/05/13 (from the past 48 hour(s))  CBC     Status: Abnormal   Collection Time    09/08/13  4:40 AM      Result Value Ref Range   WBC 12.2 (*) 4.0 - 10.5 K/uL   RBC 3.06 (*) 3.87 - 5.11 MIL/uL   Hemoglobin 9.3 (*) 12.0 - 15.0 g/dL   HCT 27.7 (*) 36.0 - 46.0 %   MCV 90.5  78.0 - 100.0 fL   MCH 30.4  26.0 - 34.0 pg   MCHC 33.6  30.0 - 36.0 g/dL   RDW 15.4  11.5 - 15.5 %   Platelets 445 (*) 150 - 400 K/uL  BASIC METABOLIC PANEL     Status: Abnormal   Collection Time    09/08/13  4:40 AM      Result Value Ref Range   Sodium 140  137 - 147  mEq/L   Potassium 4.2  3.7 - 5.3 mEq/L   Chloride 107  96 - 112 mEq/L   CO2 22  19 - 32 mEq/L   Glucose, Bld 90  70 - 99 mg/dL   BUN 12  6 - 23 mg/dL   Creatinine, Ser 0.90  0.50 - 1.10 mg/dL   Calcium 8.4  8.4 - 10.5 mg/dL   GFR calc  non Af Amer 57 (*) >90 mL/min   GFR calc Af Amer 66 (*) >90 mL/min   Comment: (NOTE)     The eGFR has been calculated using the CKD EPI equation.     This calculation has not been validated in all clinical situations.     eGFR's persistently <90 mL/min signify possible Chronic Kidney     Disease.  PROTIME-INR     Status: Abnormal   Collection Time    09/08/13  4:40 AM      Result Value Ref Range   Prothrombin Time 16.0 (*) 11.6 - 15.2 seconds   INR 1.31  0.00 - 1.49  APTT     Status: Abnormal   Collection Time    09/08/13  4:40 AM      Result Value Ref Range   aPTT 40 (*) 24 - 37 seconds   Comment:            IF BASELINE aPTT IS ELEVATED,     SUGGEST PATIENT RISK ASSESSMENT     BE USED TO DETERMINE APPROPRIATE     ANTICOAGULANT THERAPY.   No results found.    Assessment/Plan Diverticulitis with Microperf (Admitted [facility] 08/24/13-08/28/13) Now with Recurrent sigmoid diverticulitis with perforation and worsened abscess (4 x 3.8 x 3.4cm) s/p perc drain Leukocytosis Unresolved infection on Cipro/flagyl for 13 days Diarrhea - c.diff negative Large hiatal hernia - asymptomatic Severe lumbar scoliosis  Plan: 1.  IR has completed perc drain, await gs and cultures 2.  Conservative management:  NPO except ice chips, bowel rest, IVF, pain control, antibiotics (merepenem Day #4)  3.  Normally we don't use meropenem 2nd line usually zosyn (but can have renal effects) or invanz, She has also been on this for 4 days with actual progression of her infection and continued WBC count.  May need to change antibiotic based on culture/sensitivities.  Hopefully this will resolve conservatively without surgical intervention this admission, but if her pain does not  resolve in the next few days or worsens will need Ex Lap, likely hartman's with colostomy.  She is aware of this. 4.  SCD's and lovenox/heparin for dvt prophylaxis, hold Eliquis for now in case we need to take her to the OR this admission 5.  Ambulate and IS 6.  Repeat labs tomorrow   Coralie Keens, [facility] 09/09/2013, 2:57 PM Pager: [phone]  Agree with above. She is still tender. She is widowed.  She has two children, but one lives in [place] and one lives near [place], [place].  Alphonsa Overall, MD, [facility] Pager: [phone] Office phone:  [phone]

## 2013-09-10 DIAGNOSIS — R197 Diarrhea, unspecified: Secondary | ICD-10-CM

## 2013-09-10 LAB — CBC
HEMATOCRIT: 30.2 % — AB (ref 36.0–46.0)
Hemoglobin: 10 g/dL — ABNORMAL LOW (ref 12.0–15.0)
MCH: 30.5 pg (ref 26.0–34.0)
MCHC: 33.1 g/dL (ref 30.0–36.0)
MCV: 92.1 fL (ref 78.0–100.0)
Platelets: 370 10*3/uL (ref 150–400)
RBC: 3.28 MIL/uL — AB (ref 3.87–5.11)
RDW: 15.2 % (ref 11.5–15.5)
WBC: 13 10*3/uL — AB (ref 4.0–10.5)

## 2013-09-10 LAB — BASIC METABOLIC PANEL
BUN: 10 mg/dL (ref 6–23)
CHLORIDE: 104 meq/L (ref 96–112)
CO2: 20 meq/L (ref 19–32)
CREATININE: 0.93 mg/dL (ref 0.50–1.10)
Calcium: 7.9 mg/dL — ABNORMAL LOW (ref 8.4–10.5)
GFR calc Af Amer: 64 mL/min — ABNORMAL LOW (ref >90)
GFR calc non Af Amer: 55 mL/min — ABNORMAL LOW (ref >90)
Glucose, Bld: 86 mg/dL (ref 70–99)
POTASSIUM: 4 meq/L (ref 3.7–5.3)
Sodium: 139 mEq/L (ref 137–147)

## 2013-09-10 MED ORDER — SODIUM CHLORIDE 0.9 % IV SOLN
1.0000 g | Freq: Two times a day (BID) | INTRAVENOUS | Status: DC
  Administered 2013-09-10 – 2013-09-16 (×13): 1 g via INTRAVENOUS
  Filled 2013-09-10 (×14): qty 1

## 2013-09-10 NOTE — Progress Notes (Signed)
Subjective: Still has some belly pain but not too bad  Objective: Vital signs in last 24 hours: Temp:  [97.4 F (36.3 C)-99.4 F (37.4 C)] 99.4 F (37.4 C) (05/09 0444) Pulse Rate:  [69-71] 69 (05/09 0444) Resp:  [16-24] 16 (05/09 0444) BP: (112-136)/(41-60) 127/41 mmHg (05/09 0444) SpO2:  [94 %-100 %] 98 % (05/09 0444) Last BM Date: 09/09/13  Intake/Output from previous day: 05/08 0701 - 05/09 0700 In: 120 [P.O.:120] Out: 3 [Drains:3] Intake/Output this shift:    Resp: clear to auscultation bilaterally Cardio: regular rate and rhythm GI: soft, mild diffuse tenderness but no guarding or peritonitis  Lab Results:   Recent Labs  09/08/13 0440 09/10/13 0540  WBC 12.2* 13.0*  HGB 9.3* 10.0*  HCT 27.7* 30.2*  PLT 445* 370   BMET  Recent Labs  09/08/13 0440 09/10/13 0540  NA 140 139  K 4.2 4.0  CL 107 104  CO2 22 20  GLUCOSE 90 86  BUN 12 10  CREATININE 0.90 0.93  CALCIUM 8.4 7.9*   PT/INR  Recent Labs  09/08/13 0440  LABPROT 16.0*  INR 1.31   ABG No results found for this basename: PHART, PCO2, PO2, HCO3,  in the last 72 hours  Studies/Results: Ct Image Guided Drainage By Percutaneous Catheter  09/09/2013   CLINICAL DATA:  Progressive Diverticular abscess  EXAM: CT GUIDED DRAINAGE OF PELVIC ABSCESS  ANESTHESIA/SEDATION: Intravenous Fentanyl and Versed were administered as conscious sedation during continuous cardiorespiratory monitoring by the radiology RN, with a total moderate sedation time of LESS THAN 30 minutes.  PROCEDURE: The procedure, risks, benefits, and alternatives were explained to the patient. Questions regarding the procedure were encouraged and answered. The patient understands and consents to the procedure.  Select axial scans through the lower pelvis were obtained. The abscess cavity posterior to the urinary bladder which had been seen on previous diagnostic CT was again localized. This now contains some oral contrast material, and an  measures 4.2 cm maximum transverse diameter. Additionally, there is a progressive adjacent abscess to the right of the previous collection which has significantly progressed since the previous scan, measuring up to 5 cm maximum transverse diameter. This collection also contains some oral contrast material and gas which had not been seen on the previous study. This had a more favorable percutaneous approach and also represented the largest collection, so this region was targeted for drain catheter placement.  The overlying skin was prepped with Betadinein a sterile fashion, and a sterile drape was applied covering the operative field. A sterile gown and sterile gloves were used for the procedure. Local anesthesia was provided with 1% Lidocaine.  Under CT fluoroscopic guidance, an 18 gauge trocar needle was advanced into the collection. Purulent material could be aspirated. An Amplatz guidewire advanced easily within the collection, its position confirmed on CT fluoroscopy. Tract was dilated to facilitate placement of a 12 French pigtail catheter, formed centrally within the collection, the position confirmed on CT. Catheter secured externally with 0 Prolene suture and StatLock and placed to gravity drain bag. A sample of the aspirate was sent for routine Gram stain and culture. The patient tolerated the procedure well.  COMPLICATIONS: No immediate complication.  IMPRESSION: 1. Technically successful CT-guided pelvic abscess drain catheter placement.   Electronically Signed   By: Arne Cleveland M.D.   On: 09/09/2013 15:54    Anti-infectives: Anti-infectives   Start     Dose/Rate Route Frequency Ordered Stop   09/06/13 1800  meropenem (MERREM) 1  g in sodium chloride 0.9 % 100 mL IVPB  Status:  Discontinued     1 g 200 mL/hr over 30 Minutes Intravenous Every 12 hours 09/06/13 1629 09/10/13 0725   09/05/13 1600  metroNIDAZOLE (FLAGYL) tablet 500 mg  Status:  Discontinued     500 mg Oral 3 times daily 09/05/13  1257 09/07/13 0833      Assessment/Plan: s/p * No surgery found * Continue abx and bowel rest Continue perc drain Will follow  LOS: 5 days    Kimberly [name] 09/10/2013

## 2013-09-10 NOTE — Progress Notes (Signed)
[facility] PROGRESS NOTE Assessment/Plan: Acute gouty arthritis:  - Swelling and pain resolved.  - will continue PRN analgesics.  - Base on PT evaluation/rec's patient will benefit of SNF; but patient currently declining SNF.  - Pt at home  Colonic abscess/Ongoing diarrhea with recent acute diverticulitis and microperforation:: - Change antibiotics to zosyn. Clear liq diet if ok with surgery - Base on CT scan evaluation, patient now with colonic abscess; IR consulted for percutaneous drainage on 5/8  - afebrile. - Will follow clinical response  - ? When  To restart Eliquis.  Symptomatic shortness of breath:  - SOB resolved  - maybe slight exacerbation of heart failure with IVF's resuscitation; now compensated again.  - Will monitor.   Hypokalemia: - Repleted and WNL   Chronic atrial fibrillation: - On chronic Eliquis ( now on hold) and carvedilol.  - Her heart rate is controlled.  - Holding eliquis for IR percutaneous drainage tomorow    Status post pacemaker: - Currently stable.   Hypertension: - Controlled on carvedilol.   Stage II-III chronic kidney disease: -  Creatinine is currently stable.   Chronic anemia  - With a history of blood loss anemia in June 2014 from severe erosive reflux esophagitis and prepyloric antral ulcer.  - Continue niferex  - Continue PPI and oral B12  - No signs of overt bleeding.  Chronic diastolic heart failure with mild exacerbation: now compensated and back to baseline.  - Will follow strict I's and O's  - Check daily weight     Code Status: Full  Family Communication: no family at bedside  Disposition Plan: remains as inpatient; will discharge home with [facility] services when medically stable.    Consultants:  General surgey  Procedures:  IR  Antibiotics:  Meropenem   flagyl  HPI/Subjective: Still having abd pain  Objective: Filed Vitals:   09/09/13 1323 09/09/13 1456 09/09/13 2107 09/10/13 0444  BP:  126/51 116/54 112/45 127/41  Pulse: 70 70 71 69  Temp:  97.4 F (36.3 C) 98.5 F (36.9 C) 99.4 F (37.4 C)  TempSrc:  Oral Oral Oral  Resp: 18 18 18 16   Height:      Weight:      SpO2: 97% 100% 99% 98%    Intake/Output Summary (Last 24 hours) at 09/10/13 0830 Last data filed at 09/09/13 1457  Gross per 24 hour  Intake    120 ml  Output      3 ml  Net    117 ml   Filed Weights   09/07/13 0606 09/08/13 0444 09/09/13 0612  Weight: 69 kg (152 lb 1.9 oz) 65.6 kg (144 lb 10 oz) 66.9 kg (147 lb 7.8 oz)    Exam:  General: Alert, awake, oriented x3, in no acute distress.  HEENT: No bruits, no goiter.  Heart: Regular rate and rhythm, without murmurs, rubs, gallops.  Lungs: Good air movement, clear Abdomen: Soft,  tenderness, positive bowel sounds.    Data Reviewed: Basic Metabolic Panel:  Recent Labs Lab 09/05/13 0742 09/06/13 0453 09/07/13 0518 09/08/13 0440 09/10/13 0540  NA 137 141 141 140 139  K 2.9* 4.5 4.3 4.2 4.0  CL 99 108 108 107 104  CO2 24 22 21 22 20   GLUCOSE 112* 108* 127* 90 86  BUN 8 10 12 12 10   CREATININE 1.06 1.00 0.92 0.90 0.93  CALCIUM 8.2* 8.6 8.5 8.4 7.9*  MG 1.5 1.6  --   --   --  Liver Function Tests:  Recent Labs Lab 09/06/13 0453  AST 11  ALT <5  ALKPHOS 55  BILITOT 0.3  PROT 5.9*  ALBUMIN 2.4*   No results found for this basename: LIPASE, AMYLASE,  in the last 168 hours No results found for this basename: AMMONIA,  in the last 168 hours CBC:  Recent Labs Lab 09/05/13 0742 09/06/13 0453 09/07/13 0518 09/08/13 0440 09/10/13 0540  WBC 13.9* 11.8* 11.0* 12.2* 13.0*  NEUTROABS 11.8*  --   --   --   --   HGB 10.1* 9.4* 9.0* 9.3* 10.0*  HCT 29.1* 27.1* 25.7* 27.7* 30.2*  MCV 88.4 89.4 89.5 90.5 92.1  PLT 428* 401* 431* 445* 370   Cardiac Enzymes: No results found for this basename: CKTOTAL, CKMB, CKMBINDEX, TROPONINI,  in the last 168 hours BNP (last 3 results) No results found for this basename: PROBNP,  in the last  8760 hours CBG: No results found for this basename: GLUCAP,  in the last 168 hours  Recent Results (from the past 240 hour(s))  CLOSTRIDIUM DIFFICILE BY PCR     Status: None   Collection Time    09/06/13  6:44 AM      Result Value Ref Range Status   C difficile by pcr NEGATIVE  NEGATIVE Final  CULTURE, ROUTINE-ABSCESS     Status: None   Collection Time    09/09/13  1:01 PM      Result Value Ref Range Status   Specimen Description DRAINAGE   Final   Special Requests Normal   Final   Gram Stain PENDING   Incomplete   Culture     Final   Value: NO GROWTH 1 DAY     Performed at [facility]   Report Status PENDING   Incomplete     Studies: Ct Image Guided Drainage By Percutaneous Catheter  09/09/2013   CLINICAL DATA:  Progressive Diverticular abscess  EXAM: CT GUIDED DRAINAGE OF PELVIC ABSCESS  ANESTHESIA/SEDATION: Intravenous Fentanyl and Versed were administered as conscious sedation during continuous cardiorespiratory monitoring by the radiology RN, with a total moderate sedation time of LESS THAN 30 minutes.  PROCEDURE: The procedure, risks, benefits, and alternatives were explained to the patient. Questions regarding the procedure were encouraged and answered. The patient understands and consents to the procedure.  Select axial scans through the lower pelvis were obtained. The abscess cavity posterior to the urinary bladder which had been seen on previous diagnostic CT was again localized. This now contains some oral contrast material, and an measures 4.2 cm maximum transverse diameter. Additionally, there is a progressive adjacent abscess to the right of the previous collection which has significantly progressed since the previous scan, measuring up to 5 cm maximum transverse diameter. This collection also contains some oral contrast material and gas which had not been seen on the previous study. This had a more favorable percutaneous approach and also represented the largest  collection, so this region was targeted for drain catheter placement.  The overlying skin was prepped with Betadinein a sterile fashion, and a sterile drape was applied covering the operative field. A sterile gown and sterile gloves were used for the procedure. Local anesthesia was provided with 1% Lidocaine.  Under CT fluoroscopic guidance, an 18 gauge trocar needle was advanced into the collection. Purulent material could be aspirated. An Amplatz guidewire advanced easily within the collection, its position confirmed on CT fluoroscopy. Tract was dilated to facilitate placement of a 12 French pigtail catheter, formed  centrally within the collection, the position confirmed on CT. Catheter secured externally with 0 Prolene suture and StatLock and placed to gravity drain bag. A sample of the aspirate was sent for routine Gram stain and culture. The patient tolerated the procedure well.  COMPLICATIONS: No immediate complication.  IMPRESSION: 1. Technically successful CT-guided pelvic abscess drain catheter placement.   Electronically Signed   By: Arne Cleveland M.D.   On: 09/09/2013 15:54    Scheduled Meds: . carvedilol  6.25 mg Oral BID WC  . multivitamins with iron  1 tablet Oral Daily  . pantoprazole  40 mg Oral Daily  . temazepam  15 mg Oral QHS  . vitamin B-12  1,000 mcg Oral Daily   Continuous Infusions:    Charlynne Cousins  [facility] Pager [phone].  If 8PM-8AM, please contact night-coverage at [email or web address], password [facility] 09/10/2013, 8:30 AM  LOS: 5 days

## 2013-09-10 NOTE — Progress Notes (Signed)
Subjective: Patient states she feels "lousy" today. She denies any significant drain site pain.   Objective: Physical Exam: BP 127/41  Pulse 69  Temp(Src) 99.4 F (37.4 C) (Oral)  Resp 16  Ht 5\' 7"  (1.702 m)  Wt 147 lb 7.8 oz (66.9 kg)  BMI 23.09 kg/m2  SpO2 98%  General: A&Ox3, NAD, sitting in bed Abd: Soft, lower abd tenderness, RLQ drain dressing C/D/I, no signs of leaking or bleeding, output purulent thick in tubing only, 24 hour recorded 15ml, Cx MODERATE GRAM POSITIVE COCCI IN PAIRS, MODERATE GRAM POSITIVE RODS/ pending   Labs: CBC  Recent Labs  09/08/13 0440 09/10/13 0540  WBC 12.2* 13.0*  HGB 9.3* 10.0*  HCT 27.7* 30.2*  PLT 445* 370   BMET  Recent Labs  09/08/13 0440 09/10/13 0540  NA 140 139  K 4.2 4.0  CL 107 104  CO2 22 20  GLUCOSE 90 86  BUN 12 10  CREATININE 0.90 0.93  CALCIUM 8.4 7.9*   LFT No results found for this basename: PROT, ALBUMIN, AST, ALT, ALKPHOS, BILITOT, BILIDIR, IBILI, LIPASE,  in the last 72 hours PT/INR  Recent Labs  09/08/13 0440  LABPROT 16.0*  INR 1.31     Studies/Results: Ct Image Guided Drainage By Percutaneous Catheter  09/09/2013   CLINICAL DATA:  Progressive Diverticular abscess  EXAM: CT GUIDED DRAINAGE OF PELVIC ABSCESS  ANESTHESIA/SEDATION: Intravenous Fentanyl and Versed were administered as conscious sedation during continuous cardiorespiratory monitoring by the radiology RN, with a total moderate sedation time of LESS THAN 30 minutes.  PROCEDURE: The procedure, risks, benefits, and alternatives were explained to the patient. Questions regarding the procedure were encouraged and answered. The patient understands and consents to the procedure.  Select axial scans through the lower pelvis were obtained. The abscess cavity posterior to the urinary bladder which had been seen on previous diagnostic CT was again localized. This now contains some oral contrast material, and an measures 4.2 cm maximum transverse diameter.  Additionally, there is a progressive adjacent abscess to the right of the previous collection which has significantly progressed since the previous scan, measuring up to 5 cm maximum transverse diameter. This collection also contains some oral contrast material and gas which had not been seen on the previous study. This had a more favorable percutaneous approach and also represented the largest collection, so this region was targeted for drain catheter placement.  The overlying skin was prepped with Betadinein a sterile fashion, and a sterile drape was applied covering the operative field. A sterile gown and sterile gloves were used for the procedure. Local anesthesia was provided with 1% Lidocaine.  Under CT fluoroscopic guidance, an 18 gauge trocar needle was advanced into the collection. Purulent material could be aspirated. An Amplatz guidewire advanced easily within the collection, its position confirmed on CT fluoroscopy. Tract was dilated to facilitate placement of a 12 French pigtail catheter, formed centrally within the collection, the position confirmed on CT. Catheter secured externally with 0 Prolene suture and StatLock and placed to gravity drain bag. A sample of the aspirate was sent for routine Gram stain and culture. The patient tolerated the procedure well.  COMPLICATIONS: No immediate complication.  IMPRESSION: 1. Technically successful CT-guided pelvic abscess drain catheter placement.   Electronically Signed   By: Arne Cleveland M.D.   On: 09/09/2013 15:54    Assessment/Plan: Diverticulitis with microperforation Leukocytosis on IV antibiotics, trending up 13 (12.2), low grade fever S/p 12 f percutaneous drain placement total output 8  ml, continue drain flushes TID and monitor output IR will follow    LOS: 5 days    Hedy Jacob PA-C 09/10/2013 12:11 PM

## 2013-09-11 LAB — CULTURE, ROUTINE-ABSCESS: Special Requests: NORMAL

## 2013-09-11 MED ORDER — FLUCONAZOLE 100MG IVPB
100.0000 mg | INTRAVENOUS | Status: DC
  Administered 2013-09-12: 100 mg via INTRAVENOUS
  Filled 2013-09-11 (×2): qty 50

## 2013-09-11 MED ORDER — DEXTROSE-NACL 5-0.45 % IV SOLN
INTRAVENOUS | Status: DC
  Administered 2013-09-11: 75 mL/h via INTRAVENOUS
  Administered 2013-09-14: 10 mL/h via INTRAVENOUS

## 2013-09-11 MED ORDER — OXYCODONE-ACETAMINOPHEN 5-325 MG PO TABS
2.0000 | ORAL_TABLET | ORAL | Status: DC | PRN
Start: 2013-09-11 — End: 2013-09-14
  Administered 2013-09-11 – 2013-09-14 (×11): 2 via ORAL
  Filled 2013-09-11 (×11): qty 2

## 2013-09-11 MED ORDER — MORPHINE SULFATE 4 MG/ML IJ SOLN
4.0000 mg | INTRAMUSCULAR | Status: DC | PRN
  Administered 2013-09-11 – 2013-09-12 (×4): 4 mg via INTRAVENOUS
  Filled 2013-09-11 (×4): qty 1

## 2013-09-11 MED ORDER — FLUCONAZOLE IN SODIUM CHLORIDE 200-0.9 MG/100ML-% IV SOLN
200.0000 mg | Freq: Once | INTRAVENOUS | Status: AC
  Administered 2013-09-11: 200 mg via INTRAVENOUS
  Filled 2013-09-11: qty 100

## 2013-09-11 MED ORDER — METRONIDAZOLE IN NACL 5-0.79 MG/ML-% IV SOLN
500.0000 mg | Freq: Three times a day (TID) | INTRAVENOUS | Status: DC
  Administered 2013-09-11 – 2013-09-13 (×6): 500 mg via INTRAVENOUS
  Filled 2013-09-11 (×8): qty 100

## 2013-09-11 NOTE — Progress Notes (Signed)
Seen and agree  

## 2013-09-11 NOTE — Progress Notes (Signed)
[facility] PROGRESS NOTE Assessment/Plan: Acute gouty arthritis:  - Swelling and pain resolved.  - will continue PRN analgesics.  - Base on PT evaluation/rec's patient will benefit of SNF; but patient currently declining SNF.  - Pt at home  Colonic abscess/Ongoing diarrhea with recent acute diverticulitis and microperforation:: - Cont meropenem. Worsening leukocytosis add flagyl - Base on CT scan evaluation, patient now with colonic abscess; IR consulted for percutaneous drainage on 5/8  - Cultures grew Candida albicans start diflucan with Gram + rods and coccin - Will follow clinical response, repeat Ct abd and pelvis with contrast. - ? To surgery when To restart Eliquis. - increase pain meds.  Symptomatic shortness of breath:  - SOB resolved  - maybe slight exacerbation of heart failure with IVF's resuscitation; now compensated again.  - Will monitor.   Hypokalemia: - Repleted and WNL   Chronic atrial fibrillation: - On chronic Eliquis ( now on hold) and carvedilol.  - Her heart rate is controlled.  - Holding eliquis for IR percutaneous drainage tomorow    Status post pacemaker: - Currently stable.   Hypertension: - Controlled on carvedilol.   Stage II-III chronic kidney disease: -  Creatinine is currently stable.   Chronic anemia  - With a history of blood loss anemia in June 2014 from severe erosive reflux esophagitis and prepyloric antral ulcer.  - Continue niferex once able to take orlas - Continue PPI and oral B12  - No signs of overt bleeding.  Chronic diastolic heart failure with mild exacerbation: now compensated and back to baseline.  - Will follow strict I's and O's  - Check daily weight   Code Status: Full  Family Communication: no family at bedside  Disposition Plan: remains as inpatient; will discharge home with [facility] services when medically stable.    Consultants:  General surgey  Procedures:  IR  Antibiotics:  Meropenem    flagyl  HPI/Subjective: Still having abd pain  Objective: Filed Vitals:   09/10/13 0444 09/10/13 1332 09/10/13 2111 09/11/13 0548  BP: 127/41 115/50 108/44 109/43  Pulse: 69 72 67 76  Temp: 99.4 F (37.4 C) 97.9 F (36.6 C) 98.1 F (36.7 C) 97.5 F (36.4 C)  TempSrc: Oral Oral Oral Oral  Resp: 16 16 18 18   Height:      Weight:      SpO2: 98% 100% 98% 98%    Intake/Output Summary (Last 24 hours) at 09/11/13 1101 Last data filed at 09/11/13 0800  Gross per 24 hour  Intake    240 ml  Output      0 ml  Net    240 ml   Filed Weights   09/07/13 0606 09/08/13 0444 09/09/13 0612  Weight: 69 kg (152 lb 1.9 oz) 65.6 kg (144 lb 10 oz) 66.9 kg (147 lb 7.8 oz)    Exam:  General: Alert, awake, oriented x3, in no acute distress.  HEENT: No bruits, no goiter.  Heart: Regular rate and rhythm, without murmurs, rubs, gallops.  Lungs: Good air movement, clear Abdomen: Soft,  tenderness, positive bowel sounds.    Data Reviewed: Basic Metabolic Panel:  Recent Labs Lab 09/05/13 0742 09/06/13 0453 09/07/13 0518 09/08/13 0440 09/10/13 0540  NA 137 141 141 140 139  K 2.9* 4.5 4.3 4.2 4.0  CL 99 108 108 107 104  CO2 24 22 21 22 20   GLUCOSE 112* 108* 127* 90 86  BUN 8 10 12 12 10   CREATININE 1.06 1.00 0.92 0.90 0.93  CALCIUM 8.2* 8.6 8.5 8.4 7.9*  MG 1.5 1.6  --   --   --    Liver Function Tests:  Recent Labs Lab 09/06/13 0453  AST 11  ALT <5  ALKPHOS 55  BILITOT 0.3  PROT 5.9*  ALBUMIN 2.4*   No results found for this basename: LIPASE, AMYLASE,  in the last 168 hours No results found for this basename: AMMONIA,  in the last 168 hours CBC:  Recent Labs Lab 09/05/13 0742 09/06/13 0453 09/07/13 0518 09/08/13 0440 09/10/13 0540  WBC 13.9* 11.8* 11.0* 12.2* 13.0*  NEUTROABS 11.8*  --   --   --   --   HGB 10.1* 9.4* 9.0* 9.3* 10.0*  HCT 29.1* 27.1* 25.7* 27.7* 30.2*  MCV 88.4 89.4 89.5 90.5 92.1  PLT 428* 401* 431* 445* 370   Cardiac Enzymes: No  results found for this basename: CKTOTAL, CKMB, CKMBINDEX, TROPONINI,  in the last 168 hours BNP (last 3 results) No results found for this basename: PROBNP,  in the last 8760 hours CBG: No results found for this basename: GLUCAP,  in the last 168 hours  Recent Results (from the past 240 hour(s))  CLOSTRIDIUM DIFFICILE BY PCR     Status: None   Collection Time    09/06/13  6:44 AM      Result Value Ref Range Status   C difficile by pcr NEGATIVE  NEGATIVE Final  CULTURE, ROUTINE-ABSCESS     Status: None   Collection Time    09/09/13  1:01 PM      Result Value Ref Range Status   Specimen Description DRAINAGE   Final   Special Requests Normal   Final   Gram Stain     Final   Value: ABUNDANT WBC PRESENT,BOTH PMN AND MONONUCLEAR     NO SQUAMOUS EPITHELIAL CELLS SEEN     MODERATE GRAM POSITIVE COCCI IN PAIRS     MODERATE GRAM POSITIVE RODS     Performed at [facility]   Culture     Final   Value: [place]     Performed at [facility]   Report Status 09/11/2013 FINAL   Final     Studies: Ct Image Guided Drainage By Percutaneous Catheter  09/09/2013   CLINICAL DATA:  Progressive Diverticular abscess  EXAM: CT GUIDED DRAINAGE OF PELVIC ABSCESS  ANESTHESIA/SEDATION: Intravenous Fentanyl and Versed were administered as conscious sedation during continuous cardiorespiratory monitoring by the radiology RN, with a total moderate sedation time of LESS THAN 30 minutes.  PROCEDURE: The procedure, risks, benefits, and alternatives were explained to the patient. Questions regarding the procedure were encouraged and answered. The patient understands and consents to the procedure.  Select axial scans through the lower pelvis were obtained. The abscess cavity posterior to the urinary bladder which had been seen on previous diagnostic CT was again localized. This now contains some oral contrast material, and an measures 4.2 cm maximum transverse diameter. Additionally,  there is a progressive adjacent abscess to the right of the previous collection which has significantly progressed since the previous scan, measuring up to 5 cm maximum transverse diameter. This collection also contains some oral contrast material and gas which had not been seen on the previous study. This had a more favorable percutaneous approach and also represented the largest collection, so this region was targeted for drain catheter placement.  The overlying skin was prepped with Betadinein a sterile fashion, and a sterile drape was applied covering the  operative field. A sterile gown and sterile gloves were used for the procedure. Local anesthesia was provided with 1% Lidocaine.  Under CT fluoroscopic guidance, an 18 gauge trocar needle was advanced into the collection. Purulent material could be aspirated. An Amplatz guidewire advanced easily within the collection, its position confirmed on CT fluoroscopy. Tract was dilated to facilitate placement of a 12 French pigtail catheter, formed centrally within the collection, the position confirmed on CT. Catheter secured externally with 0 Prolene suture and StatLock and placed to gravity drain bag. A sample of the aspirate was sent for routine Gram stain and culture. The patient tolerated the procedure well.  COMPLICATIONS: No immediate complication.  IMPRESSION: 1. Technically successful CT-guided pelvic abscess drain catheter placement.   Electronically Signed   By: Arne Cleveland M.D.   On: 09/09/2013 15:54    Scheduled Meds: . carvedilol  6.25 mg Oral BID WC  . meropenem (MERREM) IV  1 g Intravenous Q12H  . multivitamins with iron  1 tablet Oral Daily  . pantoprazole  40 mg Oral Daily  . temazepam  15 mg Oral QHS  . vitamin B-12  1,000 mcg Oral Daily   Continuous Infusions:    Charlynne Cousins  [facility] Pager [phone].  If 8PM-8AM, please contact night-coverage at [email or web address], password [facility] 09/11/2013, 11:01 AM  LOS: 6 days

## 2013-09-11 NOTE — Progress Notes (Signed)
Patient ID: Kimberly [name], female   DOB: [birth year], 80 y.o.   MRN: [id number]   Subjective: Feels lousy today.  Sore all over. Afebrile.  WBC 12.2--->13K today.   Objective:  Vital signs:  Filed Vitals:   09/10/13 0444 09/10/13 1332 09/10/13 2111 09/11/13 0548  BP: 127/41 115/50 108/44 109/43  Pulse: 69 72 67 76  Temp: 99.4 F (37.4 C) 97.9 F (36.6 C) 98.1 F (36.7 C) 97.5 F (36.4 C)  TempSrc: Oral Oral Oral Oral  Resp: $Remo'16 16 18 18  'RXHej$ Height:      Weight:      SpO2: 98% 100% 98% 98%    Last BM Date: 09/09/13  Intake/Output   Yesterday:  05/09 0701 - 05/10 0700 In: 240 [P.O.:240] Out: -  This shift:    I/O last 3 completed shifts: In: 240 [P.O.:240] Out: -     Physical Exam: General: Pt awake/alert/oriented x4 in no acute distress Abdomen: Soft.  Nondistended.  ttp throughout.  No evidence of peritonitis.  No incarcerated hernias.  Problem List:   Principal Problem:   Colonic diverticular abscess Active Problems:   Atrial fibrillation   Pacemaker   Hypertension   Hypokalemia   CKD (chronic kidney disease) stage 3, GFR 30-59 ml/min   Chronic anticoagulation   Acute diverticulitis   Anemia   Acute gouty arthritis   Inability to ambulate due to ankle or foot   Diarrhea    Results:   Labs: Results for orders placed during the hospital encounter of 09/05/13 (from the past 48 hour(s))  CULTURE, ROUTINE-ABSCESS     Status: None   Collection Time    09/09/13  1:01 PM      Result Value Ref Range   Specimen Description DRAINAGE     Special Requests Normal     Gram Stain       Value: ABUNDANT WBC PRESENT,BOTH PMN AND MONONUCLEAR     NO SQUAMOUS EPITHELIAL CELLS SEEN     MODERATE GRAM POSITIVE COCCI IN PAIRS     MODERATE GRAM POSITIVE RODS     Performed at [facility]   Culture       Value: MODERATE CANDIDA ALBICANS     Performed at [facility]   Report Status 09/11/2013 FINAL    CBC     Status: Abnormal   Collection Time     09/10/13  5:40 AM      Result Value Ref Range   WBC 13.0 (*) 4.0 - 10.5 K/uL   RBC 3.28 (*) 3.87 - 5.11 MIL/uL   Hemoglobin 10.0 (*) 12.0 - 15.0 g/dL   HCT 30.2 (*) 36.0 - 46.0 %   MCV 92.1  78.0 - 100.0 fL   MCH 30.5  26.0 - 34.0 pg   MCHC 33.1  30.0 - 36.0 g/dL   RDW 15.2  11.5 - 15.5 %   Platelets 370  150 - 400 K/uL  BASIC METABOLIC PANEL     Status: Abnormal   Collection Time    09/10/13  5:40 AM      Result Value Ref Range   Sodium 139  137 - 147 mEq/L   Potassium 4.0  3.7 - 5.3 mEq/L   Chloride 104  96 - 112 mEq/L   CO2 20  19 - 32 mEq/L   Glucose, Bld 86  70 - 99 mg/dL   BUN 10  6 - 23 mg/dL   Creatinine, Ser 0.93  0.50 - 1.10 mg/dL  Calcium 7.9 (*) 8.4 - 10.5 mg/dL   GFR calc non Af Amer 55 (*) >90 mL/min   GFR calc Af Amer 64 (*) >90 mL/min   Comment: (NOTE)     The eGFR has been calculated using the CKD EPI equation.     This calculation has not been validated in all clinical situations.     eGFR's persistently <90 mL/min signify possible Chronic Kidney     Disease.    Imaging / Studies: Ct Image Guided Drainage By Percutaneous Catheter  09/09/2013   CLINICAL DATA:  Progressive Diverticular abscess  EXAM: CT GUIDED DRAINAGE OF PELVIC ABSCESS  ANESTHESIA/SEDATION: Intravenous Fentanyl and Versed were administered as conscious sedation during continuous cardiorespiratory monitoring by the radiology RN, with a total moderate sedation time of LESS THAN 30 minutes.  PROCEDURE: The procedure, risks, benefits, and alternatives were explained to the patient. Questions regarding the procedure were encouraged and answered. The patient understands and consents to the procedure.  Select axial scans through the lower pelvis were obtained. The abscess cavity posterior to the urinary bladder which had been seen on previous diagnostic CT was again localized. This now contains some oral contrast material, and an measures 4.2 cm maximum transverse diameter. Additionally, there is a  progressive adjacent abscess to the right of the previous collection which has significantly progressed since the previous scan, measuring up to 5 cm maximum transverse diameter. This collection also contains some oral contrast material and gas which had not been seen on the previous study. This had a more favorable percutaneous approach and also represented the largest collection, so this region was targeted for drain catheter placement.  The overlying skin was prepped with Betadinein a sterile fashion, and a sterile drape was applied covering the operative field. A sterile gown and sterile gloves were used for the procedure. Local anesthesia was provided with 1% Lidocaine.  Under CT fluoroscopic guidance, an 18 gauge trocar needle was advanced into the collection. Purulent material could be aspirated. An Amplatz guidewire advanced easily within the collection, its position confirmed on CT fluoroscopy. Tract was dilated to facilitate placement of a 12 French pigtail catheter, formed centrally within the collection, the position confirmed on CT. Catheter secured externally with 0 Prolene suture and StatLock and placed to gravity drain bag. A sample of the aspirate was sent for routine Gram stain and culture. The patient tolerated the procedure well.  COMPLICATIONS: No immediate complication.  IMPRESSION: 1. Technically successful CT-guided pelvic abscess drain catheter placement.   Electronically Signed   By: Arne Cleveland M.D.   On: 09/09/2013 15:54    Medications / Allergies: per chart  Antibiotics: Anti-infectives   Start     Dose/Rate Route Frequency Ordered Stop   09/12/13 1200  fluconazole (DIFLUCAN) IVPB 100 mg     100 mg 50 mL/hr over 60 Minutes Intravenous Every 24 hours 09/11/13 1102     09/11/13 1200  fluconazole (DIFLUCAN) IVPB 200 mg     200 mg 100 mL/hr over 60 Minutes Intravenous  Once 09/11/13 1102     09/11/13 1200  metroNIDAZOLE (FLAGYL) IVPB 500 mg     500 mg 100 mL/hr over 60  Minutes Intravenous Every 8 hours 09/11/13 1108     09/10/13 1115  meropenem (MERREM) 1 g in sodium chloride 0.9 % 100 mL IVPB     1 g 200 mL/hr over 30 Minutes Intravenous Every 12 hours 09/10/13 1114     09/06/13 1800  meropenem (MERREM) 1 g in  sodium chloride 0.9 % 100 mL IVPB  Status:  Discontinued     1 g 200 mL/hr over 30 Minutes Intravenous Every 12 hours 09/06/13 1629 09/10/13 0725   09/05/13 1600  metroNIDAZOLE (FLAGYL) tablet 500 mg  Status:  Discontinued     500 mg Oral 3 times daily 09/05/13 1257 09/07/13 8177      Assessment/Plan Diverticulitis with Microperf (Admitted AP 08/24/13-08/28/13)  Now with Recurrent sigmoid diverticulitis with perforation and worsened abscess (4 x 3.8 x 3.4cm) s/p perc drain  Leukocytosis  Unresolved infection on Cipro/flagyl for 13 days  Diarrhea - c.diff negative  Large hiatal hernia - asymptomatic -WBC at 13K, feels worse today,.  Flagyl, meropenem, fluconazole -Continue bowel rest -Drain care -will consider imaging in a few days if no improvement   Erby Pian, [facility] Surgery Pager [phone] Office [phone]  09/11/2013 11:57 AM

## 2013-09-11 NOTE — Progress Notes (Signed)
ANTIBIOTIC CONSULT NOTE - FOLLOW UP  Pharmacy Consult for Meropenem Indication: intra-abdominal infection  Allergies  Allergen Reactions  . Iodine Rash and Other (See Comments)    REACTION:If injected,  Rash/irritated skin reaction "welts"  . Penicillins Hives  . Sulfa Antibiotics Rash   Labs:  Recent Labs  09/10/13 0540  WBC 13.0*  HGB 10.0*  PLT 370  CREATININE 0.93   Estimated Creatinine Clearance: 43.8 ml/min (by C-G formula based on Cr of 0.93).  Microbiology: Recent Results (from the past 720 hour(s))  CLOSTRIDIUM DIFFICILE BY PCR     Status: None   Collection Time    09/06/13  6:44 AM      Result Value Ref Range Status   C difficile by pcr NEGATIVE  NEGATIVE Final  CULTURE, ROUTINE-ABSCESS     Status: None   Collection Time    09/09/13  1:01 PM      Result Value Ref Range Status   Specimen Description DRAINAGE   Final   Special Requests Normal   Final   Gram Stain     Final   Value: ABUNDANT WBC PRESENT,BOTH PMN AND MONONUCLEAR     NO SQUAMOUS EPITHELIAL CELLS SEEN     MODERATE GRAM POSITIVE COCCI IN PAIRS     MODERATE GRAM POSITIVE RODS     Performed at [facility]   Culture     Final   Value: MODERATE CANDIDA ALBICANS     Performed at [facility]   Report Status 09/11/2013 FINAL   Final    Anti-infectives   Start     Dose/Rate Route Frequency Ordered Stop   09/12/13 1200  fluconazole (DIFLUCAN) IVPB 100 mg     100 mg 50 mL/hr over 60 Minutes Intravenous Every 24 hours 09/11/13 1102     09/11/13 1200  fluconazole (DIFLUCAN) IVPB 200 mg     200 mg 100 mL/hr over 60 Minutes Intravenous  Once 09/11/13 1102     09/11/13 1200  metroNIDAZOLE (FLAGYL) IVPB 500 mg     500 mg 100 mL/hr over 60 Minutes Intravenous Every 8 hours 09/11/13 1108     09/10/13 1115  meropenem (MERREM) 1 g in sodium chloride 0.9 % 100 mL IVPB     1 g 200 mL/hr over 30 Minutes Intravenous Every 12 hours 09/10/13 1114     09/06/13 1800  meropenem (MERREM) 1 g in  sodium chloride 0.9 % 100 mL IVPB  Status:  Discontinued     1 g 200 mL/hr over 30 Minutes Intravenous Every 12 hours 09/06/13 1629 09/10/13 0725   09/05/13 1600  metroNIDAZOLE (FLAGYL) tablet 500 mg  Status:  Discontinued     500 mg Oral 3 times daily 09/05/13 1257 09/07/13 0833      Assessment: Day # 6 Meropenem. Flagyl discontinued on 5/6, with C diff PCR negative on 5/5.   S/p US-guided percutaneous drain.  WBC = 13 (slightly increased) Scr stable   Abscess culture with Candida, GPR, GPC Added Diflucan and Flagyl 5/10  Goal of Therapy:  appropriate Meropenem dose for renal function and infection  Plan:   Continue Meropemen 1 gram IV q12hrs.  Follow renal function, culture data and clinical progress.   Eliquis has been on hold for procedure.  Will follow up for restarting.    Tad Moore, PharmD Pager: [phone] 09/11/2013,12:26 PM

## 2013-09-12 ENCOUNTER — Encounter (HOSPITAL_COMMUNITY): Payer: Self-pay | Admitting: Radiology

## 2013-09-12 ENCOUNTER — Inpatient Hospital Stay (HOSPITAL_COMMUNITY): Payer: Medicare Other

## 2013-09-12 ENCOUNTER — Encounter: Payer: Medicare Other | Admitting: Cardiovascular Disease

## 2013-09-12 LAB — BASIC METABOLIC PANEL
BUN: 12 mg/dL (ref 6–23)
CALCIUM: 8 mg/dL — AB (ref 8.4–10.5)
CHLORIDE: 103 meq/L (ref 96–112)
CO2: 18 meq/L — AB (ref 19–32)
Creatinine, Ser: 0.99 mg/dL (ref 0.50–1.10)
GFR calc Af Amer: 59 mL/min — ABNORMAL LOW (ref >90)
GFR calc non Af Amer: 51 mL/min — ABNORMAL LOW (ref >90)
GLUCOSE: 91 mg/dL (ref 70–99)
Potassium: 3.7 mEq/L (ref 3.7–5.3)
SODIUM: 137 meq/L (ref 137–147)

## 2013-09-12 LAB — CBC
HCT: 28.2 % — ABNORMAL LOW (ref 36.0–46.0)
Hemoglobin: 9.3 g/dL — ABNORMAL LOW (ref 12.0–15.0)
MCH: 30 pg (ref 26.0–34.0)
MCHC: 33 g/dL (ref 30.0–36.0)
MCV: 91 fL (ref 78.0–100.0)
PLATELETS: 308 10*3/uL (ref 150–400)
RBC: 3.1 MIL/uL — ABNORMAL LOW (ref 3.87–5.11)
RDW: 15.4 % (ref 11.5–15.5)
WBC: 10.3 10*3/uL (ref 4.0–10.5)

## 2013-09-12 MED ORDER — DIPHENHYDRAMINE HCL 50 MG PO CAPS
50.0000 mg | ORAL_CAPSULE | Freq: Once | ORAL | Status: DC
  Filled 2013-09-12: qty 1

## 2013-09-12 MED ORDER — PREDNISONE 20 MG PO TABS
20.0000 mg | ORAL_TABLET | Freq: Two times a day (BID) | ORAL | Status: DC
  Administered 2013-09-13 – 2013-09-15 (×5): 20 mg via ORAL
  Filled 2013-09-12 (×8): qty 1

## 2013-09-12 MED ORDER — IOHEXOL 300 MG/ML  SOLN
20.0000 mL | INTRAMUSCULAR | Status: AC
  Administered 2013-09-12 (×2): 20 mL via ORAL

## 2013-09-12 MED ORDER — IOHEXOL 300 MG/ML  SOLN
100.0000 mL | Freq: Once | INTRAMUSCULAR | Status: AC | PRN
  Administered 2013-09-12: 100 mL via INTRAVENOUS

## 2013-09-12 MED ORDER — PREDNISONE 50 MG PO TABS
50.0000 mg | ORAL_TABLET | Freq: Four times a day (QID) | ORAL | Status: DC
  Administered 2013-09-12 (×2): 50 mg via ORAL
  Filled 2013-09-12 (×3): qty 1

## 2013-09-12 NOTE — Progress Notes (Signed)
Note: This document was prepared with digital dictation and possible smart phrase technology. Any transcriptional errors that result from this process are unintentional.   Kimberly [name] [id number] DOB: 01/03/1927 DOA: 09/05/2013 PCP: Anthoney Harada, MD  Brief narrative: 80 y/o ?, known h/o geographic ulceration distal 10 cm tubular? Barrett's, fibromyalgia, Chronic Afib H/o U GIB 10/11/12, Afib s/p PPM 10/12/10, H/o severe NICM, h/o chronic neck/ + T12 compression, Complex regional pain syndrome, admitted with first onset sigmoid diverticulitis + Microperforation-he had a  Ct guided perc drain placed 09/05/13 and was admitted to the Hospital for an acute gouty flare.  Past medical history-As per Problem list Chart reviewed as below- reviewed  Consultants:  IR  Surgery  Procedures:  Perc drain 5/4  Antibiotics:  Meropenem 5/5-->5/10  Fluconazole 5/10  Flagyl 5/10   Subjective   Alert.  States 5/10 Lower abd pain.  No n/v No fever or chills. No Cp. No diarrhea   Objective    Interim History: none  Telemetry: Non tele   Objective: Filed Vitals:   09/11/13 1453 09/11/13 2138 09/12/13 0500 09/12/13 1400  BP: 117/41 103/41 102/43 107/60  Pulse: 70 76 76 70  Temp: 99.3 F (37.4 C) 98.3 F (36.8 C) 97.9 F (36.6 C) 97.5 F (36.4 C)  TempSrc: Oral Oral Oral Oral  Resp: 20 18 18 18   Height:      Weight:      SpO2: 100% 99% 99% 99%    Intake/Output Summary (Last 24 hours) at 09/12/13 1441 Last data filed at 09/12/13 1300  Gross per 24 hour  Intake   1270 ml  Output    700 ml  Net    570 ml    Exam:  General: eomi, ncat.  No pallor, ict Cardiovascular: s1 s2 no m/r/g, rrr Respiratory:  Clear no added sound Abdomen:  Soft, slightly distended Skin no le edema Neuro-Bilateral equal strgn, no deficts noted otherwsie  Data Reviewed: Basic Metabolic Panel:  Recent Labs Lab 09/06/13 0453 09/07/13 0518 09/08/13 0440 09/10/13 0540 09/12/13 0537   NA 141 141 140 139 137  K 4.5 4.3 4.2 4.0 3.7  CL 108 108 107 104 103  CO2 22 21 22 20  18*  GLUCOSE 108* 127* 90 86 91  BUN 10 12 12 10 12   CREATININE 1.00 0.92 0.90 0.93 0.99  CALCIUM 8.6 8.5 8.4 7.9* 8.0*  MG 1.6  --   --   --   --    Liver Function Tests:  Recent Labs Lab 09/06/13 0453  AST 11  ALT <5  ALKPHOS 55  BILITOT 0.3  PROT 5.9*  ALBUMIN 2.4*   No results found for this basename: LIPASE, AMYLASE,  in the last 168 hours No results found for this basename: AMMONIA,  in the last 168 hours CBC:  Recent Labs Lab 09/06/13 0453 09/07/13 0518 09/08/13 0440 09/10/13 0540 09/12/13 1058  WBC 11.8* 11.0* 12.2* 13.0* 10.3  HGB 9.4* 9.0* 9.3* 10.0* 9.3*  HCT 27.1* 25.7* 27.7* 30.2* 28.2*  MCV 89.4 89.5 90.5 92.1 91.0  PLT 401* 431* 445* 370 308   Cardiac Enzymes: No results found for this basename: CKTOTAL, CKMB, CKMBINDEX, TROPONINI,  in the last 168 hours BNP: No components found with this basename: POCBNP,  CBG: No results found for this basename: GLUCAP,  in the last 168 hours  Recent Results (from the past 240 hour(s))  CLOSTRIDIUM DIFFICILE BY PCR     Status: None   Collection Time  09/06/13  6:44 AM      Result Value Ref Range Status   C difficile by pcr NEGATIVE  NEGATIVE Final  CULTURE, ROUTINE-ABSCESS     Status: None   Collection Time    09/09/13  1:01 PM      Result Value Ref Range Status   Specimen Description DRAINAGE   Final   Special Requests Normal   Final   Gram Stain     Final   Value: ABUNDANT WBC PRESENT,BOTH PMN AND MONONUCLEAR     NO SQUAMOUS EPITHELIAL CELLS SEEN     MODERATE GRAM POSITIVE COCCI IN PAIRS     MODERATE GRAM POSITIVE RODS     Performed at [facility]   Culture     Final   Value: MODERATE CANDIDA ALBICANS     Performed at [facility]   Report Status 09/11/2013 FINAL   Final     Studies:              All Imaging reviewed and is as per above notation   Scheduled Meds: . carvedilol  6.25 mg  Oral BID WC  . diphenhydrAMINE  50 mg Oral Once  . fluconazole (DIFLUCAN) IV  100 mg Intravenous Q24H  . meropenem (MERREM) IV  1 g Intravenous Q12H  . metronidazole  500 mg Intravenous Q8H  . multivitamins with iron  1 tablet Oral Daily  . pantoprazole  40 mg Oral Daily  . predniSONE  50 mg Oral Q6H  . temazepam  15 mg Oral QHS  . vitamin B-12  1,000 mcg Oral Daily   Continuous Infusions: . dextrose 5 % and 0.45% NaCl 75 mL/hr (09/11/13 1150)     Assessment/Plan: 1. Complicated diverticulitis-CT scan with contrast being done. Unfortunately patient has a dye allergy will so therefore will need to give steroids and premedicate. Defer further decision making to Gen. Surgery-continue meal and, cultures grew Candida albicans-started.  Surgery and interventional radiology are managing a train Diflucan-continue pain management. Continue careful D5 half normal saline 75 cc per hour 2. Chronic atrial fibrillation, s/p PPM replacement 10/12/10 + NICM-for now continue Coreg 6.25 twice a day. Appears to sound like she is in sinus rhythm. Elliquis on hold 3. Acute gouty arthritis-will start tapering prednisone from 50 every 6 hours to 20 twice a day. Will probably benefit from allopurinol and colchicine. 4. Decompensated acute diastolic CHF-now compensated-keep ins and outs equal and no Lasix at this time. Daily weights ordered 5. GERD-continue Protonix 40 daily 6. Insomnia-continue monitoring. She has requested Benadryl which we will continue short-term  Code Status: Full Family Communication: No bedside Disposition Plan: Inpatient   [name], MD  [facility] Pager [phone] 09/12/2013, 2:41 PM    LOS: 7 days

## 2013-09-12 NOTE — Progress Notes (Signed)
Physical Therapy Treatment Patient Details Name: Kimberly [name] MRN: [id number] DOB: 10-05-1926 Today's Date: 09/30/2013    History of Present Illness Kimberly [name] is a 80 y.o. female with a past medical history of atrial fibrillation with pacemaker on chronic anticoagulation, arthritis, fibromyalgia, who has had lower abdominal discomfort for the past many months. Pt found to have pelvic abscess and drain placed 5/8. Pt with gout in rt foot which has now improved.    PT Comments    Pt with improved rt foot pain and able to amb today.  Follow Up Recommendations  Home health PT (pt refusing SNF.)     Equipment Recommendations  None recommended by PT    Recommendations for Other Services       Precautions / Restrictions Precautions Precautions: Fall    Mobility  Bed Mobility Overal bed mobility: Modified Independent             General bed mobility comments: Incr time and use of rail  Transfers Overall transfer level: Needs assistance Equipment used: Rolling walker (2 wheeled) Transfers: Sit to/from Stand Sit to Stand: Min guard            Ambulation/Gait Ambulation/Gait assistance: Min guard Ambulation Distance (Feet): 50 Feet Assistive device: Rolling walker (2 wheeled) Gait Pattern/deviations: Step-through pattern;Decreased step length - right;Decreased step length - left;Trunk flexed;Shuffle   Gait velocity interpretation: <1.8 ft/sec, indicative of [name] for recurrent falls General Gait Details: Verbal cues to stand more erect but pt reports flexed posture baseline   Stairs            Wheelchair Mobility    Modified Rankin (Stroke Patients Only)       Balance           Standing balance support: Bilateral upper extremity supported Standing balance-Leahy Scale: Poor                      Cognition Arousal/Alertness: Awake/alert Behavior During Therapy: WFL for tasks assessed/performed Overall Cognitive Status: Within  Functional Limits for tasks assessed                      Exercises      General Comments        Pertinent Vitals/Pain Mild abd pain    Home Living                      Prior Function            PT Goals (current goals can now be found in the care plan section) Progress towards PT goals: Progressing toward goals    Frequency  Min 3X/week    PT Plan Discharge plan needs to be updated    Co-evaluation             End of Session   Activity Tolerance: Patient limited by fatigue Patient left: in bed;with call bell/phone within reach     Time: [id number] PT Time Calculation (min): 26 min  Charges:  $Gait Training: 23-37 mins                    G CodesShary Decamp Markea Ruzich Sep 30, 2013, 4:02 PM  [employer] PT [phone]

## 2013-09-12 NOTE — Progress Notes (Signed)
Agree with above, wbc normal but think reasonable given drain issues to rescan

## 2013-09-12 NOTE — Progress Notes (Signed)
  Subjective: Pelvic abscess drain placed 5/8 Some better today Output minimal per RN 50 cc in bag- brown purulent RN states they are flushing into bag instead of into drain catheter  Objective: Vital signs in last 24 hours: Temp:  [97.9 F (36.6 C)-99.3 F (37.4 C)] 97.9 F (36.6 C) (05/11 0500) Pulse Rate:  [70-76] 76 (05/11 0500) Resp:  [18-20] 18 (05/11 0500) BP: (102-117)/(41-43) 102/43 mmHg (05/11 0500) SpO2:  [99 %-100 %] 99 % (05/11 0500) Last BM Date: 09/11/13  Intake/Output from previous day: 05/10 0701 - 05/11 0700 In: 1270 [P.O.:360; I.V.:910] Out: -  Intake/Output this shift:    PE:  Afeb; vss Up on bedside commode Drain site sl tender; no bleeding; clean and dry 50 cc dark purulent output in bag Cx: + candida albicans No wbc in few days    Lab Results:   Recent Labs  09/10/13 0540  WBC 13.0*  HGB 10.0*  HCT 30.2*  PLT 370   BMET  Recent Labs  09/10/13 0540 09/12/13 0537  NA 139 137  K 4.0 3.7  CL 104 103  CO2 20 18*  GLUCOSE 86 91  BUN 10 12  CREATININE 0.93 0.99  CALCIUM 7.9* 8.0*   PT/INR No results found for this basename: LABPROT, INR,  in the last 72 hours ABG No results found for this basename: PHART, PCO2, PO2, HCO3,  in the last 72 hours  Studies/Results: No results found.  Anti-infectives: Anti-infectives   Start     Dose/Rate Route Frequency Ordered Stop   09/12/13 1200  fluconazole (DIFLUCAN) IVPB 100 mg     100 mg 50 mL/hr over 60 Minutes Intravenous Every 24 hours 09/11/13 1102     09/11/13 1200  fluconazole (DIFLUCAN) IVPB 200 mg     200 mg 100 mL/hr over 60 Minutes Intravenous  Once 09/11/13 1102 09/11/13 1252   09/11/13 1200  metroNIDAZOLE (FLAGYL) IVPB 500 mg     500 mg 100 mL/hr over 60 Minutes Intravenous Every 8 hours 09/11/13 1108     09/10/13 1115  meropenem (MERREM) 1 g in sodium chloride 0.9 % 100 mL IVPB     1 g 200 mL/hr over 30 Minutes Intravenous Every 12 hours 09/10/13 1114     09/06/13  1800  meropenem (MERREM) 1 g in sodium chloride 0.9 % 100 mL IVPB  Status:  Discontinued     1 g 200 mL/hr over 30 Minutes Intravenous Every 12 hours 09/06/13 1629 09/10/13 0725   09/05/13 1600  metroNIDAZOLE (FLAGYL) tablet 500 mg  Status:  Discontinued     500 mg Oral 3 times daily 09/05/13 1257 09/07/13 [id number]      Assessment/Plan: s/p * No surgery found *  Pelvic abscess drain placed 5/8 Some better today Output probably minimal altho bag has 50 cc RN has been flushing into bag instead of into catheter New order to flush drain catheter with 5 cc three times daily Consider re CT if no real output in 24 hrs   LOS: 7 days    Lavonia Drafts 09/12/2013

## 2013-09-12 NOTE — Progress Notes (Signed)
Agree 

## 2013-09-12 NOTE — Progress Notes (Signed)
Patient ID: Kimberly [name], female   DOB: 1926/12/01, 80 y.o.   MRN: [id number]  Subjective: Pain is the same.  Afebrile.  No tachycardia.    Objective:  Vital signs:  Filed Vitals:   09/11/13 0548 09/11/13 1453 09/11/13 2138 09/12/13 0500  BP: 109/43 117/41 103/41 102/43  Pulse: 76 70 76 76  Temp: 97.5 F (36.4 C) 99.3 F (37.4 C) 98.3 F (36.8 C) 97.9 F (36.6 C)  TempSrc: Oral Oral Oral Oral  Resp: 18 20 18 18   Height:      Weight:      SpO2: 98% 100% 99% 99%    Last BM Date: 09/11/13  Intake/Output   Yesterday:  05/10 0701 - 05/11 0700 In: 1270 [P.O.:360; I.V.:910] Out: -  This shift:    I/O last 3 completed shifts: In: 4174 [P.O.:600; I.V.:910] Out: -    Physical Exam:  General: Pt awake/alert/oriented x4 in no acute distress  Abdomen: Soft. Nondistended. ttp throughout. No evidence of peritonitis. No incarcerated hernias.   Problem List:   Principal Problem:   Colonic diverticular abscess Active Problems:   Atrial fibrillation   Pacemaker   Hypertension   Hypokalemia   CKD (chronic kidney disease) stage 3, GFR 30-59 ml/min   Chronic anticoagulation   Acute diverticulitis   Anemia   Acute gouty arthritis   Inability to ambulate due to ankle or foot   Diarrhea    Results:   Labs: Results for orders placed during the hospital encounter of 09/05/13 (from the past 48 hour(s))  BASIC METABOLIC PANEL     Status: Abnormal   Collection Time    09/12/13  5:37 AM      Result Value Ref Range   Sodium 137  137 - 147 mEq/L   Potassium 3.7  3.7 - 5.3 mEq/L   Chloride 103  96 - 112 mEq/L   CO2 18 (*) 19 - 32 mEq/L   Glucose, Bld 91  70 - 99 mg/dL   BUN 12  6 - 23 mg/dL   Creatinine, Ser 0.99  0.50 - 1.10 mg/dL   Calcium 8.0 (*) 8.4 - 10.5 mg/dL   GFR calc non Af Amer 51 (*) >90 mL/min   GFR calc Af Amer 59 (*) >90 mL/min   Comment: (NOTE)     The eGFR has been calculated using the CKD EPI equation.     This calculation has not been validated in all  clinical situations.     eGFR's persistently <90 mL/min signify possible Chronic Kidney     Disease.    Imaging / Studies: No results found.  Medications / Allergies: per chart  Antibiotics: Anti-infectives   Start     Dose/Rate Route Frequency Ordered Stop   09/12/13 1200  fluconazole (DIFLUCAN) IVPB 100 mg     100 mg 50 mL/hr over 60 Minutes Intravenous Every 24 hours 09/11/13 1102     09/11/13 1200  fluconazole (DIFLUCAN) IVPB 200 mg     200 mg 100 mL/hr over 60 Minutes Intravenous  Once 09/11/13 1102 09/11/13 1252   09/11/13 1200  metroNIDAZOLE (FLAGYL) IVPB 500 mg     500 mg 100 mL/hr over 60 Minutes Intravenous Every 8 hours 09/11/13 1108     09/10/13 1115  meropenem (MERREM) 1 g in sodium chloride 0.9 % 100 mL IVPB     1 g 200 mL/hr over 30 Minutes Intravenous Every 12 hours 09/10/13 1114     09/06/13 1800  meropenem (MERREM) 1  g in sodium chloride 0.9 % 100 mL IVPB  Status:  Discontinued     1 g 200 mL/hr over 30 Minutes Intravenous Every 12 hours 09/06/13 1629 09/10/13 0725   09/05/13 1600  metroNIDAZOLE (FLAGYL) tablet 500 mg  Status:  Discontinued     500 mg Oral 3 times daily 09/05/13 1257 09/07/13 1443     Assessment/Plan  Diverticulitis with Microperf (Admitted AP 08/24/13-08/28/13)  Now with Recurrent sigmoid diverticulitis with perforation and worsened abscess (4 x 3.8 x 3.4cm) s/p perc drain  Leukocytosis  Large hiatal hernia  Await CBC from today.  Prepping for CT scan Continue bowel rest, IV antibiotics, drain   Erby Pian, [facility] : [facility] Surgery Pager [phone] Office [phone]  09/12/2013 10:37 AM

## 2013-09-13 ENCOUNTER — Inpatient Hospital Stay (HOSPITAL_COMMUNITY): Payer: Medicare Other

## 2013-09-13 LAB — COMPREHENSIVE METABOLIC PANEL
ALT: 15 U/L (ref 0–35)
AST: 31 U/L (ref 0–37)
Albumin: 2 g/dL — ABNORMAL LOW (ref 3.5–5.2)
Alkaline Phosphatase: 58 U/L (ref 39–117)
BUN: 11 mg/dL (ref 6–23)
CO2: 18 mEq/L — ABNORMAL LOW (ref 19–32)
Calcium: 8.2 mg/dL — ABNORMAL LOW (ref 8.4–10.5)
Chloride: 104 mEq/L (ref 96–112)
Creatinine, Ser: 0.83 mg/dL (ref 0.50–1.10)
GFR calc non Af Amer: 63 mL/min — ABNORMAL LOW (ref >90)
GFR, EST AFRICAN AMERICAN: 73 mL/min — AB (ref >90)
Glucose, Bld: 172 mg/dL — ABNORMAL HIGH (ref 70–99)
POTASSIUM: 3.8 meq/L (ref 3.7–5.3)
Sodium: 138 mEq/L (ref 137–147)
TOTAL PROTEIN: 5.7 g/dL — AB (ref 6.0–8.3)
Total Bilirubin: <0.2 mg/dL — ABNORMAL LOW (ref 0.3–1.2)

## 2013-09-13 LAB — CBC
HEMATOCRIT: 27.7 % — AB (ref 36.0–46.0)
Hemoglobin: 9.3 g/dL — ABNORMAL LOW (ref 12.0–15.0)
MCH: 29.8 pg (ref 26.0–34.0)
MCHC: 33.6 g/dL (ref 30.0–36.0)
MCV: 88.8 fL (ref 78.0–100.0)
Platelets: 328 10*3/uL (ref 150–400)
RBC: 3.12 MIL/uL — ABNORMAL LOW (ref 3.87–5.11)
RDW: 15.4 % (ref 11.5–15.5)
WBC: 7.6 10*3/uL (ref 4.0–10.5)

## 2013-09-13 LAB — PROTIME-INR
INR: 1.15 (ref 0.00–1.49)
PROTHROMBIN TIME: 14.5 s (ref 11.6–15.2)

## 2013-09-13 MED ORDER — IOHEXOL 300 MG/ML  SOLN
50.0000 mL | Freq: Once | INTRAMUSCULAR | Status: AC | PRN
  Administered 2013-09-13: 10 mL

## 2013-09-13 MED ORDER — FLUCONAZOLE IN SODIUM CHLORIDE 400-0.9 MG/200ML-% IV SOLN
400.0000 mg | INTRAVENOUS | Status: DC
  Administered 2013-09-13 – 2013-09-16 (×4): 400 mg via INTRAVENOUS
  Filled 2013-09-13 (×5): qty 200

## 2013-09-13 NOTE — Progress Notes (Signed)
Dr. Anselm Pancoast has reviewed today's follow up CT, he has recommended drain injection today. IR aware and will perform today.  Tsosie Billing PA-C Interventional Radiology  09/13/13  9:07 AM

## 2013-09-13 NOTE — Progress Notes (Signed)
INITIAL NUTRITION ASSESSMENT  DOCUMENTATION CODES Per approved criteria  -Not Applicable   INTERVENTION: Supplement diet as appropriate   NUTRITION DIAGNOSIS: Increased nutrient needs related to abscess as evidenced by estimated needs.   Goal: Pt to meet >/= 90% of their estimated nutrition needs   Monitor:  Diet advancement, weight trend  Reason for Assessment: NPO status  80 y.o. female  Admitting Dx: Colonic diverticular abscess  ASSESSMENT: Diverticulitis with Microperf (Admitted [facility] 08/24/13-08/28/13)  Now with Recurrent sigmoid diverticulitis with perforation and worsened abscess (4 x 3.8 x 3.4cm) s/p perc drain.   IR to flush drain today. Abdominal pain better, pt having BM's.  Pt has been without adequate diet for the last 7 days and remains NPO at this time.  Nutrition-focused physical exam WNL.  Pt denies any recent weight changes and good appetite PTA.  Per RN diet being advanced to [employer] for dinner, pt had bm today. Hopeful for continued diet advancement.   Height: Ht Readings from Last 1 Encounters:  09/09/13 5\' 7"  (1.702 m)    Weight: Wt Readings from Last 1 Encounters:  09/13/13 150 lb 3.2 oz (68.13 kg)  Admission weight 142 lb (64.5 kg) 5/4 Weight has varied 140-150 lb this admission  Ideal Body Weight: 61.3 kg   % Ideal Body Weight: 105%  Wt Readings from Last 10 Encounters:  09/13/13 150 lb 3.2 oz (68.13 kg)  08/24/13 148 lb 9.4 oz (67.4 kg)  02/18/13 148 lb (67.132 kg)  10/13/12 151 lb 14.4 oz (68.9 kg)  10/13/12 151 lb 14.4 oz (68.9 kg)  12/15/11 137 lb 12.6 oz (62.5 kg)  12/11/11 143 lb (64.864 kg)  11/11/11 140 lb 3.2 oz (63.594 kg)  01/22/11 121 lb (54.885 kg)    Usual Body Weight: 140 lb  Per pt  % Usual Body Weight: within range  BMI:  Body mass index is 23.52 kg/(m^2).  Estimated Nutritional Needs: Kcal: 1500-1700 Protein: 75-85 grams Fluid: > 1.5 L/day  Skin: no issues noted  Diet Order: NPO  EDUCATION  NEEDS: -No education needs identified at this time   Intake/Output Summary (Last 24 hours) at 09/13/13 1114 Last data filed at 09/13/13 0844  Gross per 24 hour  Intake   1150 ml  Output    825 ml  Net    325 ml    Last BM: 5/12   Labs:   Recent Labs Lab 09/10/13 0540 09/12/13 0537 09/13/13 0442  NA 139 137 138  K 4.0 3.7 3.8  CL 104 103 104  CO2 20 18* 18*  BUN 10 12 11   CREATININE 0.93 0.99 0.83  CALCIUM 7.9* 8.0* 8.2*  GLUCOSE 86 91 172*    CBG (last 3)  No results found for this basename: GLUCAP,  in the last 72 hours  Scheduled Meds: . carvedilol  6.25 mg Oral BID WC  . fluconazole (DIFLUCAN) IV  400 mg Intravenous Q24H  . meropenem (MERREM) IV  1 g Intravenous Q12H  . multivitamins with iron  1 tablet Oral Daily  . pantoprazole  40 mg Oral Daily  . predniSONE  20 mg Oral BID WC  . temazepam  15 mg Oral QHS  . vitamin B-12  1,000 mcg Oral Daily    Continuous Infusions: . dextrose 5 % and 0.45% NaCl 75 mL/hr (09/11/13 1150)    Past Medical History  Diagnosis Date  . Depression   . Atrial fibrillation   . Scoliosis   . Osteoarthritis   . Pacemaker  Last saw cards 07/2013  . Fibromyalgia   . Hypertension   . Glaucoma   . Acute diverticulitis 08/24/2013  . CKD (chronic kidney disease) stage 3, GFR 30-59 ml/min 10/12/2012  . Chronic anticoagulation 10/12/2012  . Acute blood loss anemia 10/11/2012  . Antral ulcer 10/11/2012  . Erosive esophagitis 10/11/2012    Past Surgical History  Procedure Laterality Date  . Pacemaker insertion    . Abdominal hysterectomy    . Cholecystectomy    . Appendectomy    . Breast surgery    . Back surgery    . Neck surgery    . Hernia repair      right inguinal hernia and umbilical  . Tonsillectomy    . Esophagogastroduodenoscopy N/A 10/13/2012    Procedure: ESOPHAGOGASTRODUODENOSCOPY (EGD);  Surgeon: Daneil Dolin, MD;  Location: [facility];  Service: Endoscopy;  Laterality: N/A;  . Cystoscopy N/A 02/24/2013     Procedure: CYSTOSCOPY WITH URETHRAL DILITATION;  Surgeon: Marissa Nestle, MD;  Location: [facility];  Service: Urology;  Laterality: N/A;    Maylon Peppers RD, [place], [place] Pager [phone] After Hours Pager

## 2013-09-13 NOTE — Progress Notes (Signed)
Ct reviewed, on meropenem, wbc nl feels well, abd not really tender, await drain study, I think she still may resolve this with iv abx and does not have any other evidence she needs surgery now, will give clears, cont abx and see how she progresses

## 2013-09-13 NOTE — Progress Notes (Signed)
Note: This document was prepared with digital dictation and possible smart phrase technology. Any transcriptional errors that result from this process are unintentional.   Kimberly [name] [id number] DOB: 1926/07/24 DOA: 09/05/2013 PCP: Anthoney Harada, MD  Brief narrative: 80 y/o ?, known h/o geographic ulceration distal 10 cm tubular? Barrett's, fibromyalgia, Chronic Afib H/o U GIB 10/11/12, Afib s/p PPM 10/12/10, H/o severe NICM, h/o chronic neck/ + T12 compression, Complex regional pain syndrome, admitted with first onset sigmoid diverticulitis + Microperforation-he had a  Ct guided perc drain placed 09/05/13 and was admitted to the Hospital for an acute gouty flare.  Past medical history-As per Problem list Chart reviewed as below- reviewed  Consultants:  IR  Surgery  Procedures:  Perc drain 5/4  CT c Contrast Residual right-sided pelvic abscess lying just superior to an  indwelling pigtail catheter. Maximum diameter of residual abscess  collection is 3.7 cm. No new abscess collections are identified   Antibiotics:  Meropenem 5/5-->5/10  Fluconazole 5/10  Flagyl 5/10   Subjective   Alert.  Sipping broth NO pain Large formed BM Abd discomfort somewhat better and copntrolled    Objective    Interim History: none  Telemetry: Non tele   Objective: Filed Vitals:   09/12/13 2140 09/13/13 0500 09/13/13 0557 09/13/13 1418  BP: 106/62 138/68  142/69  Pulse: 71 70  69  Temp: 97.4 F (36.3 C) 97.4 F (36.3 C)  97.2 F (36.2 C)  TempSrc: Oral Oral  Oral  Resp: 18 18  17   Height:      Weight:   68.13 kg (150 lb 3.2 oz)   SpO2: 98% 100%  99%    Intake/Output Summary (Last 24 hours) at 09/13/13 1645 Last data filed at 09/13/13 0844  Gross per 24 hour  Intake   1150 ml  Output    575 ml  Net    575 ml    Exam:  General: eomi, ncat.  No pallor, ict Cardiovascular: s1 s2 no m/r/g, rrr Respiratory:  Clear no added sound Abdomen:  Soft, slightly  distended, but no tenderness Skin no le edema Neuro-Bilateral equal strgn, no deficts noted otherwsie  Data Reviewed: Basic Metabolic Panel:  Recent Labs Lab 09/07/13 0518 09/08/13 0440 09/10/13 0540 09/12/13 0537 09/13/13 0442  NA 141 140 139 137 138  K 4.3 4.2 4.0 3.7 3.8  CL 108 107 104 103 104  CO2 21 22 20  18* 18*  GLUCOSE 127* 90 86 91 172*  BUN 12 12 10 12 11   CREATININE 0.92 0.90 0.93 0.99 0.83  CALCIUM 8.5 8.4 7.9* 8.0* 8.2*   Liver Function Tests:  Recent Labs Lab 09/13/13 0442  AST 31  ALT 15  ALKPHOS 58  BILITOT <0.2*  PROT 5.7*  ALBUMIN 2.0*   No results found for this basename: LIPASE, AMYLASE,  in the last 168 hours No results found for this basename: AMMONIA,  in the last 168 hours CBC:  Recent Labs Lab 09/07/13 0518 09/08/13 0440 09/10/13 0540 09/12/13 1058 09/13/13 0442  WBC 11.0* 12.2* 13.0* 10.3 7.6  HGB 9.0* 9.3* 10.0* 9.3* 9.3*  HCT 25.7* 27.7* 30.2* 28.2* 27.7*  MCV 89.5 90.5 92.1 91.0 88.8  PLT 431* 445* 370 308 328   Cardiac Enzymes: No results found for this basename: CKTOTAL, CKMB, CKMBINDEX, TROPONINI,  in the last 168 hours BNP: No components found with this basename: POCBNP,  CBG: No results found for this basename: GLUCAP,  in the last 168 hours  Recent  Results (from the past 240 hour(s))  CLOSTRIDIUM DIFFICILE BY PCR     Status: None   Collection Time    09/06/13  6:44 AM      Result Value Ref Range Status   C difficile by pcr NEGATIVE  NEGATIVE Final  CULTURE, ROUTINE-ABSCESS     Status: None   Collection Time    09/09/13  1:01 PM      Result Value Ref Range Status   Specimen Description DRAINAGE   Final   Special Requests Normal   Final   Gram Stain     Final   Value: ABUNDANT WBC PRESENT,BOTH PMN AND MONONUCLEAR     NO SQUAMOUS EPITHELIAL CELLS SEEN     MODERATE GRAM POSITIVE COCCI IN PAIRS     MODERATE GRAM POSITIVE RODS     Performed at [facility]   Culture     Final   Value: MODERATE CANDIDA  ALBICANS     Performed at [facility]   Report Status 09/11/2013 FINAL   Final     Studies:              All Imaging reviewed and is as per above notation   Scheduled Meds: . carvedilol  6.25 mg Oral BID WC  . fluconazole (DIFLUCAN) IV  400 mg Intravenous Q24H  . meropenem (MERREM) IV  1 g Intravenous Q12H  . multivitamins with iron  1 tablet Oral Daily  . pantoprazole  40 mg Oral Daily  . predniSONE  20 mg Oral BID WC  . temazepam  15 mg Oral QHS  . vitamin B-12  1,000 mcg Oral Daily   Continuous Infusions: . dextrose 5 % and 0.45% NaCl 75 mL/hr (09/11/13 1150)     Assessment/Plan: 1. Complicated diverticulitis-CT scan with contrast = Area 3.7 cm, IR considering drain injeciton .  Defer further decision making to Gen. Surgery-continue meal and, cultures grew Candida albicans-started.  Surgery and interventional radiology are managing drain. Continue curent Rx doses Diflucan 400 daily, Meropenem bid -continue pain management. IVF KVO 5/12 2. Chronic atrial fibrillation, s/p PPM replacement 10/12/10 + NICM-for now continue Coreg 6.25 twice a day. Appears to sound like she is in sinus rhythm. Elliquis on hold 3. Acute gouty arthritis-will start tapering prednisone from 50 every 6 hours to 20 twice a day. Will probably benefit from allopurinol and colchicine. 4. Decompensated acute diastolic CHF-now compensated-keep ins and outs equal and no Lasix at this time. Daily weights ordered 5. GERD-continue Protonix 40 daily 6. Insomnia-continue monitoring. She has requested Benadryl which we will continue short-term  Code Status: Full Family Communication: No bedside Disposition Plan: Inpatient   [name], MD  [facility] Pager [phone] 09/13/2013, 4:45 PM    LOS: 8 days

## 2013-09-13 NOTE — Progress Notes (Signed)
Subjective: Pt doing well, pain improved in lower abdomen, no N/V.  Having BM's and flatus.  Drain continues to drain purulent drainage (49mL?).   Objective: Vital signs in last 24 hours: Temp:  [97.4 F (36.3 C)-97.5 F (36.4 C)] 97.4 F (36.3 C) (05/12 0500) Pulse Rate:  [70-71] 70 (05/12 0500) Resp:  [18] 18 (05/12 0500) BP: (106-138)/(60-68) 138/68 mmHg (05/12 0500) SpO2:  [98 %-100 %] 100 % (05/12 0500) Weight:  [150 lb 3.2 oz (68.13 kg)] 150 lb 3.2 oz (68.13 kg) (05/12 0557) Last BM Date: 09/11/13  Intake/Output from previous day: 05/11 0701 - 05/12 0700 In: 1150 [P.O.:240; I.V.:910] Out: 1250 [Urine:1250] Intake/Output this shift:    PE: Gen:  Alert, NAD, pleasant Abd: Soft, NT (much improved), ND, +BS, no HSM, IR drain in RLQ with yellow/tan purulent drainage in tubing, minimal in bag   Lab Results:   Recent Labs  09/12/13 1058 09/13/13 0442  WBC 10.3 7.6  HGB 9.3* 9.3*  HCT 28.2* 27.7*  PLT 308 328   BMET  Recent Labs  09/12/13 0537 09/13/13 0442  NA 137 138  K 3.7 3.8  CL 103 104  CO2 18* 18*  GLUCOSE 91 172*  BUN 12 11  CREATININE 0.99 0.83  CALCIUM 8.0* 8.2*   PT/INR  Recent Labs  09/13/13 0442  LABPROT 14.5  INR 1.15   CMP     Component Value Date/Time   NA 138 09/13/2013 0442   NA 141 05/30/2013 1803   K 3.8 09/13/2013 0442   CL 104 09/13/2013 0442   CO2 18* 09/13/2013 0442   GLUCOSE 172* 09/13/2013 0442   GLUCOSE 86 05/30/2013 1803   BUN 11 09/13/2013 0442   BUN 17 05/30/2013 1803   CREATININE 0.83 09/13/2013 0442   CREATININE 1.48* 10/11/2012 1134   CALCIUM 8.2* 09/13/2013 0442   PROT 5.7* 09/13/2013 0442   PROT 6.5 05/30/2013 1803   ALBUMIN 2.0* 09/13/2013 0442   AST 31 09/13/2013 0442   ALT 15 09/13/2013 0442   ALKPHOS 58 09/13/2013 0442   BILITOT <0.2* 09/13/2013 0442   GFRNONAA 63* 09/13/2013 0442   GFRNONAA 33* 10/11/2012 1134   GFRAA 73* 09/13/2013 0442   GFRAA 37* 10/11/2012 1134   Lipase     Component Value Date/Time   LIPASE 29 08/24/2013 1753       Studies/Results: Ct Abdomen Pelvis W Contrast  09/13/2013   CLINICAL DATA:  Diverticulitis, pelvic abscess and status post percutaneous catheter drainage procedure on 09/09/2013.  EXAM: CT ABDOMEN AND PELVIS WITH CONTRAST  TECHNIQUE: Multidetector CT imaging of the abdomen and pelvis was performed using the standard protocol following bolus administration of intravenous contrast.  CONTRAST:  154mL OMNIPAQUE IOHEXOL 300 MG/ML  SOLN  COMPARISON:  09/09/2013, 09/06/2013 and 08/24/2013.  FINDINGS: Right pelvic drainage catheter is in stable position. There is residual abscess fluid just superior to the pigtail catheter with the catheter lying at the base of the collection. Residual abscess containing air-fluid level measures approximately 3.1 x 3.7 cm.  No new collections are identified. There is no evidence of bowel obstruction or free intraperitoneal air. Small right basilar pleural effusion and trace left pleural effusion present. The liver, spleen, pancreas, adrenal glands and kidneys are stable. No masses, hernias or enlarged lymph nodes are seen. Bony structures showed stable advanced degenerative disease of the spine with associated rightward convex scoliosis.  IMPRESSION: Residual right-sided pelvic abscess lying just superior to an indwelling pigtail catheter. Maximum diameter of residual abscess collection  is 3.7 cm. No new abscess collections are identified.   Electronically Signed   By: Aletta Edouard M.D.   On: 09/13/2013 07:40    Anti-infectives: Anti-infectives   Start     Dose/Rate Route Frequency Ordered Stop   09/12/13 1200  fluconazole (DIFLUCAN) IVPB 100 mg     100 mg 50 mL/hr over 60 Minutes Intravenous Every 24 hours 09/11/13 1102     09/11/13 1200  fluconazole (DIFLUCAN) IVPB 200 mg     200 mg 100 mL/hr over 60 Minutes Intravenous  Once 09/11/13 1102 09/11/13 1252   09/11/13 1200  metroNIDAZOLE (FLAGYL) IVPB 500 mg     500 mg 100 mL/hr over 60  Minutes Intravenous Every 8 hours 09/11/13 1108     09/10/13 1115  meropenem (MERREM) 1 g in sodium chloride 0.9 % 100 mL IVPB     1 g 200 mL/hr over 30 Minutes Intravenous Every 12 hours 09/10/13 1114     09/06/13 1800  meropenem (MERREM) 1 g in sodium chloride 0.9 % 100 mL IVPB  Status:  Discontinued     1 g 200 mL/hr over 30 Minutes Intravenous Every 12 hours 09/06/13 1629 09/10/13 0725   09/05/13 1600  metroNIDAZOLE (FLAGYL) tablet 500 mg  Status:  Discontinued     500 mg Oral 3 times daily 09/05/13 1257 09/07/13 [id number]       Assessment/Plan Diverticulitis with Microperf (Admitted [facility] 08/24/13-08/28/13)  Now with Recurrent sigmoid diverticulitis with perforation and worsened abscess (4 x 3.8 x 3.4cm) s/p perc drain  Leukocytosis - resolved Large hiatal hernia   Plan: 1.  CBC normal.  CT scan shows 3.7cm residual abscess just superior to pigtail catheter in right pelvis, but this can not be reached per Dr. Anselm Pancoast.  They are recommending drain injection/flushing today to see if output would improve or may just d/c the drain if ineffective. 2.  Continue bowel rest, IV antibiotics, drains 3.  SCD's, not currently on DVT pharm proph.....could be on heparin or lovenox for dvt prophylaxis if IR not opposed 4.  Ambulate and IS 5.  Will await results of IR procedure, the patient is still very hesitant on a surgical procedure and she is clinically improving despite IR drain not functioning appropriately.   6. Continue Meropenem Day 7/14?    LOS: 8 days    Coralie Keens 09/13/2013, 8:33 AM Pager: [phone]

## 2013-09-13 NOTE — Progress Notes (Signed)
Came to see patient at bedside. However, patient was off the unit. She is being followed by [facility] and will continued to be followed post hospital discharge.  Marthenia Rolling, MSN- [facility] [phone]

## 2013-09-14 LAB — CBC
HCT: 27.1 % — ABNORMAL LOW (ref 36.0–46.0)
Hemoglobin: 9 g/dL — ABNORMAL LOW (ref 12.0–15.0)
MCH: 29.5 pg (ref 26.0–34.0)
MCHC: 33.2 g/dL (ref 30.0–36.0)
MCV: 88.9 fL (ref 78.0–100.0)
PLATELETS: 331 10*3/uL (ref 150–400)
RBC: 3.05 MIL/uL — AB (ref 3.87–5.11)
RDW: 15.4 % (ref 11.5–15.5)
WBC: 13.1 10*3/uL — ABNORMAL HIGH (ref 4.0–10.5)

## 2013-09-14 LAB — BASIC METABOLIC PANEL
BUN: 13 mg/dL (ref 6–23)
CO2: 20 meq/L (ref 19–32)
Calcium: 8.4 mg/dL (ref 8.4–10.5)
Chloride: 105 mEq/L (ref 96–112)
Creatinine, Ser: 0.82 mg/dL (ref 0.50–1.10)
GFR calc non Af Amer: 64 mL/min — ABNORMAL LOW (ref >90)
GFR, EST AFRICAN AMERICAN: 74 mL/min — AB (ref >90)
Glucose, Bld: 152 mg/dL — ABNORMAL HIGH (ref 70–99)
Potassium: 3.6 mEq/L — ABNORMAL LOW (ref 3.7–5.3)
SODIUM: 139 meq/L (ref 137–147)

## 2013-09-14 MED ORDER — MORPHINE SULFATE 4 MG/ML IJ SOLN
4.0000 mg | INTRAMUSCULAR | Status: DC | PRN
  Administered 2013-09-14 – 2013-09-15 (×2): 4 mg via INTRAVENOUS
  Filled 2013-09-14 (×2): qty 1

## 2013-09-14 MED ORDER — OXYCODONE-ACETAMINOPHEN 5-325 MG PO TABS
2.0000 | ORAL_TABLET | ORAL | Status: DC | PRN
  Administered 2013-09-14 – 2013-09-16 (×7): 2 via ORAL
  Filled 2013-09-14 (×7): qty 2

## 2013-09-14 NOTE — Progress Notes (Signed)
Subjective: Pt feels good, no pain.  No N/V.  Tolerated clears well.  Had a bm this am.  Ambulating some OOB, PT recommending SNF, but patient really doesn't want to go.  She says she has a great niece who can stay with her, but can't likely provide 24 hour supervision.    Objective: Vital signs in last 24 hours: Temp:  [97.2 F (36.2 C)-97.7 F (36.5 C)] 97.7 F (36.5 C) (05/13 0500) Pulse Rate:  [69-75] 75 (05/13 0500) Resp:  [17-18] 18 (05/13 0500) BP: (132-142)/(59-69) 135/60 mmHg (05/13 0500) SpO2:  [99 %] 99 % (05/12 2100) Weight:  [150 lb 3.5 oz (68.14 kg)-153 lb 8 oz (69.627 kg)] 153 lb 8 oz (69.627 kg) (05/13 0710) Last BM Date: 09/13/13  Intake/Output from previous day: 05/12 0701 - 05/13 0700 In: 1085 [P.O.:960; I.V.:120] Out: 35 [Drains:35] Intake/Output this shift:    PE: Gen:  Alert, NAD, pleasant Abd: Soft, minimal tenderness, ND, +BS, no HSM, drain is not putting out much (maybe 100mL/24 hours), but is most likely just flushing fluid.  Some purulent drainage seen in tubing.   Lab Results:   Recent Labs  09/13/13 0442 09/14/13 0412  WBC 7.6 13.1*  HGB 9.3* 9.0*  HCT 27.7* 27.1*  PLT 328 331   BMET  Recent Labs  09/13/13 0442 09/14/13 0412  NA 138 139  K 3.8 3.6*  CL 104 105  CO2 18* 20  GLUCOSE 172* 152*  BUN 11 13  CREATININE 0.83 0.82  CALCIUM 8.2* 8.4   PT/INR  Recent Labs  09/13/13 0442  LABPROT 14.5  INR 1.15   CMP     Component Value Date/Time   NA 139 09/14/2013 0412   NA 141 05/30/2013 1803   K 3.6* 09/14/2013 0412   CL 105 09/14/2013 0412   CO2 20 09/14/2013 0412   GLUCOSE 152* 09/14/2013 0412   GLUCOSE 86 05/30/2013 1803   BUN 13 09/14/2013 0412   BUN 17 05/30/2013 1803   CREATININE 0.82 09/14/2013 0412   CREATININE 1.48* 10/11/2012 1134   CALCIUM 8.4 09/14/2013 0412   PROT 5.7* 09/13/2013 0442   PROT 6.5 05/30/2013 1803   ALBUMIN 2.0* 09/13/2013 0442   AST 31 09/13/2013 0442   ALT 15 09/13/2013 0442   ALKPHOS 58 09/13/2013  0442   BILITOT <0.2* 09/13/2013 0442   GFRNONAA 64* 09/14/2013 0412   GFRNONAA 33* 10/11/2012 1134   GFRAA 74* 09/14/2013 0412   GFRAA 37* 10/11/2012 1134   Lipase     Component Value Date/Time   LIPASE 29 08/24/2013 1753       Studies/Results: Ct Abdomen Pelvis W Contrast  09/13/2013   CLINICAL DATA:  Diverticulitis, pelvic abscess and status post percutaneous catheter drainage procedure on 09/09/2013.  EXAM: CT ABDOMEN AND PELVIS WITH CONTRAST  TECHNIQUE: Multidetector CT imaging of the abdomen and pelvis was performed using the standard protocol following bolus administration of intravenous contrast.  CONTRAST:  135mL OMNIPAQUE IOHEXOL 300 MG/ML  SOLN  COMPARISON:  09/09/2013, 09/06/2013 and 08/24/2013.  FINDINGS: Right pelvic drainage catheter is in stable position. There is residual abscess fluid just superior to the pigtail catheter with the catheter lying at the base of the collection. Residual abscess containing air-fluid level measures approximately 3.1 x 3.7 cm.  No new collections are identified. There is no evidence of bowel obstruction or free intraperitoneal air. Small right basilar pleural effusion and trace left pleural effusion present. The liver, spleen, pancreas, adrenal glands and kidneys are stable.  No masses, hernias or enlarged lymph nodes are seen. Bony structures showed stable advanced degenerative disease of the spine with associated rightward convex scoliosis.  IMPRESSION: Residual right-sided pelvic abscess lying just superior to an indwelling pigtail catheter. Maximum diameter of residual abscess collection is 3.7 cm. No new abscess collections are identified.   Electronically Signed   By: Aletta Edouard M.D.   On: 09/13/2013 07:40    Anti-infectives: Anti-infectives   Start     Dose/Rate Route Frequency Ordered Stop   09/13/13 1200  fluconazole (DIFLUCAN) IVPB 400 mg     400 mg 100 mL/hr over 120 Minutes Intravenous Every 24 hours 09/13/13 0919     09/12/13 1200   fluconazole (DIFLUCAN) IVPB 100 mg  Status:  Discontinued     100 mg 50 mL/hr over 60 Minutes Intravenous Every 24 hours 09/11/13 1102 09/13/13 0919   09/11/13 1200  fluconazole (DIFLUCAN) IVPB 200 mg     200 mg 100 mL/hr over 60 Minutes Intravenous  Once 09/11/13 1102 09/11/13 1252   09/11/13 1200  metroNIDAZOLE (FLAGYL) IVPB 500 mg  Status:  Discontinued     500 mg 100 mL/hr over 60 Minutes Intravenous Every 8 hours 09/11/13 1108 09/13/13 0919   09/10/13 1115  meropenem (MERREM) 1 g in sodium chloride 0.9 % 100 mL IVPB     1 g 200 mL/hr over 30 Minutes Intravenous Every 12 hours 09/10/13 1114     09/06/13 1800  meropenem (MERREM) 1 g in sodium chloride 0.9 % 100 mL IVPB  Status:  Discontinued     1 g 200 mL/hr over 30 Minutes Intravenous Every 12 hours 09/06/13 1629 09/10/13 0725   09/05/13 1600  metroNIDAZOLE (FLAGYL) tablet 500 mg  Status:  Discontinued     500 mg Oral 3 times daily 09/05/13 1257 09/07/13 [id number]       Assessment/Plan Diverticulitis with Microperf (Admitted [facility] 08/24/13-08/28/13)  Now with Recurrent sigmoid diverticulitis with perforation and worsened abscess (4 x 3.8 x 3.4cm) s/p perc drain  Leukocytosis - worsened today to 13.1 Large hiatal hernia   Plan:  1. CBC up today. CT scan shows 3.7cm residual abscess just superior to pigtail catheter in right pelvis 2. Tolerating clears, advance to fulls 3. SCD's, not currently on DVT pharm proph.....could be on heparin or lovenox for dvt prophylaxis per primary service 4. Ambulate and IS  5. Not able to do drain study yesterday due to patients iodine allergy, hopefully she will continue to improve with antibiotics alone.  No surgical interventions planned at this time.  Drain continues to not drain much, nurses are flushing catheter with minimal results 6. Continue Meropenem Day 8/14? - for gram + rods & cocci, Diflucan Day 4 - for candida in culture 7. May need to go home with IV antibiotics 8. Looks like therapies are  recommending SNF at discharge, she wants to go home    LOS: 9 days    Coralie Keens 09/14/2013, 8:55 AM Pager: [phone]

## 2013-09-14 NOTE — Progress Notes (Signed)
Subjective: Pelvic abscess drain placed 5/8 CT 5/12 shows collection smaller but remains 3.7 cm Catheter is good position Pt still sore at site  Objective: Vital signs in last 24 hours: Temp:  [97.2 F (36.2 C)-97.7 F (36.5 C)] 97.7 F (36.5 C) (05/13 0500) Pulse Rate:  [69-75] 75 (05/13 0500) Resp:  [17-18] 18 (05/13 0500) BP: (132-142)/(59-69) 135/60 mmHg (05/13 0500) SpO2:  [99 %] 99 % (05/12 2100) Weight:  [68.14 kg (150 lb 3.5 oz)-69.627 kg (153 lb 8 oz)] 69.627 kg (153 lb 8 oz) (05/13 0710) Last BM Date: 09/13/13  Intake/Output from previous day: 05/12 0701 - 05/13 0700 In: 1085 [P.O.:960; I.V.:120] Out: 35 [Drains:35] Intake/Output this shift:    PE:  Afeb; vss Wbc up today: 13.1 (7.6) Site of drain clean and dry; tender Abd: tender low abd; no masses Output 35 cc purulent fluid yesterday; minimal in bag Flushed myself- good flow- unable to asp anything Cx: +candida albicans  Lab Results:   Recent Labs  09/13/13 0442 09/14/13 0412  WBC 7.6 13.1*  HGB 9.3* 9.0*  HCT 27.7* 27.1*  PLT 328 331   BMET  Recent Labs  09/13/13 0442 09/14/13 0412  NA 138 139  K 3.8 3.6*  CL 104 105  CO2 18* 20  GLUCOSE 172* 152*  BUN 11 13  CREATININE 0.83 0.82  CALCIUM 8.2* 8.4   PT/INR  Recent Labs  09/13/13 0442  LABPROT 14.5  INR 1.15   ABG No results found for this basename: PHART, PCO2, PO2, HCO3,  in the last 72 hours  Studies/Results: Ct Abdomen Pelvis W Contrast  09/13/2013   CLINICAL DATA:  Diverticulitis, pelvic abscess and status post percutaneous catheter drainage procedure on 09/09/2013.  EXAM: CT ABDOMEN AND PELVIS WITH CONTRAST  TECHNIQUE: Multidetector CT imaging of the abdomen and pelvis was performed using the standard protocol following bolus administration of intravenous contrast.  CONTRAST:  125mL OMNIPAQUE IOHEXOL 300 MG/ML  SOLN  COMPARISON:  09/09/2013, 09/06/2013 and 08/24/2013.  FINDINGS: Right pelvic drainage catheter is in stable  position. There is residual abscess fluid just superior to the pigtail catheter with the catheter lying at the base of the collection. Residual abscess containing air-fluid level measures approximately 3.1 x 3.7 cm.  No new collections are identified. There is no evidence of bowel obstruction or free intraperitoneal air. Small right basilar pleural effusion and trace left pleural effusion present. The liver, spleen, pancreas, adrenal glands and kidneys are stable. No masses, hernias or enlarged lymph nodes are seen. Bony structures showed stable advanced degenerative disease of the spine with associated rightward convex scoliosis.  IMPRESSION: Residual right-sided pelvic abscess lying just superior to an indwelling pigtail catheter. Maximum diameter of residual abscess collection is 3.7 cm. No new abscess collections are identified.   Electronically Signed   By: Aletta Edouard M.D.   On: 09/13/2013 07:40    Anti-infectives: Anti-infectives   Start     Dose/Rate Route Frequency Ordered Stop   09/13/13 1200  fluconazole (DIFLUCAN) IVPB 400 mg     400 mg 100 mL/hr over 120 Minutes Intravenous Every 24 hours 09/13/13 0919     09/12/13 1200  fluconazole (DIFLUCAN) IVPB 100 mg  Status:  Discontinued     100 mg 50 mL/hr over 60 Minutes Intravenous Every 24 hours 09/11/13 1102 09/13/13 0919   09/11/13 1200  fluconazole (DIFLUCAN) IVPB 200 mg     200 mg 100 mL/hr over 60 Minutes Intravenous  Once 09/11/13 1102 09/11/13 1252  09/11/13 1200  metroNIDAZOLE (FLAGYL) IVPB 500 mg  Status:  Discontinued     500 mg 100 mL/hr over 60 Minutes Intravenous Every 8 hours 09/11/13 1108 09/13/13 0919   09/10/13 1115  meropenem (MERREM) 1 g in sodium chloride 0.9 % 100 mL IVPB     1 g 200 mL/hr over 30 Minutes Intravenous Every 12 hours 09/10/13 1114     09/06/13 1800  meropenem (MERREM) 1 g in sodium chloride 0.9 % 100 mL IVPB  Status:  Discontinued     1 g 200 mL/hr over 30 Minutes Intravenous Every 12 hours  09/06/13 1629 09/10/13 0725   09/05/13 1600  metroNIDAZOLE (FLAGYL) tablet 500 mg  Status:  Discontinued     500 mg Oral 3 times daily 09/05/13 1257 09/07/13 [id number]      Assessment/Plan: s/p * No surgery found *  Pelvic abscess drain intact Ct 5/12- shows good position and smaller collection--but still 3.7 cm Continue drain Re CT when output under 15 cc/day.   LOS: 9 days    Lavonia Drafts 09/14/2013

## 2013-09-14 NOTE — Progress Notes (Signed)
ANTIBIOTIC CONSULT NOTE - FOLLOW UP  Pharmacy Consult for Meropenem Indication: intra-abdominal infection  Allergies  Allergen Reactions  . Iodine Rash and Other (See Comments)    REACTION:If injected,  Rash/irritated skin reaction "welts"  . Penicillins Hives  . Sulfa Antibiotics Rash   Labs:  Recent Labs  09/12/13 0537 09/12/13 1058 09/13/13 0442 09/14/13 0412  WBC  --  10.3 7.6 13.1*  HGB  --  9.3* 9.3* 9.0*  PLT  --  308 328 331  CREATININE 0.99  --  0.83 0.82   Estimated Creatinine Clearance: 49.7 ml/min (by C-G formula based on Cr of 0.82).  Microbiology: Recent Results (from the past 720 hour(s))  CLOSTRIDIUM DIFFICILE BY PCR     Status: None   Collection Time    09/06/13  6:44 AM      Result Value Ref Range Status   C difficile by pcr NEGATIVE  NEGATIVE Final  CULTURE, ROUTINE-ABSCESS     Status: None   Collection Time    09/09/13  1:01 PM      Result Value Ref Range Status   Specimen Description DRAINAGE   Final   Special Requests Normal   Final   Gram Stain     Final   Value: ABUNDANT WBC PRESENT,BOTH PMN AND MONONUCLEAR     NO SQUAMOUS EPITHELIAL CELLS SEEN     MODERATE GRAM POSITIVE COCCI IN PAIRS     MODERATE GRAM POSITIVE RODS     Performed at [facility]   Culture     Final   Value: MODERATE CANDIDA ALBICANS     Performed at [facility]   Report Status 09/11/2013 FINAL   Final    Anti-infectives   Start     Dose/Rate Route Frequency Ordered Stop   09/13/13 1200  fluconazole (DIFLUCAN) IVPB 400 mg     400 mg 100 mL/hr over 120 Minutes Intravenous Every 24 hours 09/13/13 0919     09/12/13 1200  fluconazole (DIFLUCAN) IVPB 100 mg  Status:  Discontinued     100 mg 50 mL/hr over 60 Minutes Intravenous Every 24 hours 09/11/13 1102 09/13/13 0919   09/11/13 1200  fluconazole (DIFLUCAN) IVPB 200 mg     200 mg 100 mL/hr over 60 Minutes Intravenous  Once 09/11/13 1102 09/11/13 1252   09/11/13 1200  metroNIDAZOLE (FLAGYL) IVPB 500 mg   Status:  Discontinued     500 mg 100 mL/hr over 60 Minutes Intravenous Every 8 hours 09/11/13 1108 09/13/13 0919   09/10/13 1115  meropenem (MERREM) 1 g in sodium chloride 0.9 % 100 mL IVPB     1 g 200 mL/hr over 30 Minutes Intravenous Every 12 hours 09/10/13 1114     09/06/13 1800  meropenem (MERREM) 1 g in sodium chloride 0.9 % 100 mL IVPB  Status:  Discontinued     1 g 200 mL/hr over 30 Minutes Intravenous Every 12 hours 09/06/13 1629 09/10/13 0725   09/05/13 1600  metroNIDAZOLE (FLAGYL) tablet 500 mg  Status:  Discontinued     500 mg Oral 3 times daily 09/05/13 1257 09/07/13 0833      Assessment: 66 yof continues on meropenem D#9 for diverticulitis w/ microperf + abscess, s/p per drain. Pt is also on fluconazole. Pt is afebrile but WBC bumped to 13.1. However, pt is on steroids which may contribute to leukocytosis. Renal function remains stable so doses remain appropriate.    Meropenem 5/5>> Flucon 5/10>> Flagyl 5/4>>5/5; 5/10>>5/12  5/8 Abscess  drainage - moderate candida 5/5 Cdiff - NEG  Goal of Therapy:  appropriate Meropenem dose for renal function and infection  Plan:  1. Continue meropenem 1gm IV Q12H 2. F/u renal fxn, C&S, clinical status and planned LOT  Salome Arnt, PharmD, BCPS Pager # [phone] 09/14/2013 10:41 AM

## 2013-09-14 NOTE — Progress Notes (Signed)
Note: This document was prepared with digital dictation and possible smart phrase technology. Any transcriptional errors that result from this process are unintentional.   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 Barrett's, fibromyalgia, Chronic Afib H/o U GIB 10/11/12, Afib s/p PPM 10/12/10, H/o severe NICM, h/o chronic neck/ + T12 compression, Complex regional pain syndrome, admitted with first onset sigmoid diverticulitis + Microperforation-he had a  Ct guided perc drain placed 09/05/13 and was admitted to the Hospital for an acute gouty flare.  Past medical history-As per Problem list Chart reviewed as below- reviewed  Consultants:  IR  Surgery  Procedures:  Perc drain 5/4  CT c Contrast Residual right-sided pelvic abscess lying just superior to an  indwelling pigtail catheter. Maximum diameter of residual abscess  collection is 3.7 cm. No new abscess collections are identified   Antibiotics:  Meropenem 5/5-->5/10  Fluconazole 5/10  Flagyl 5/10   Subjective   Alert.  Sipping broth.  Now having 7/10 abd pain.  No n/v/cp Large dark stool today Otherwise stable    Objective    Interim History: none  Telemetry: Non tele   Objective: Filed Vitals:   09/13/13 2100 09/14/13 0500 09/14/13 0710 09/14/13 1400  BP: 132/59 135/60  141/70  Pulse: 70 75  69  Temp: 97.5 F (36.4 C) 97.7 F (36.5 C)  97.3 F (36.3 C)  TempSrc:      Resp: 18 18  16   Height:      Weight:  68.14 kg (150 lb 3.5 oz) 69.627 kg (153 lb 8 oz)   SpO2: 99%   100%    Intake/Output Summary (Last 24 hours) at 09/14/13 1625 Last data filed at 09/14/13 0650  Gross per 24 hour  Intake   1085 ml  Output     10 ml  Net   1075 ml    Exam:  General: eomi, ncat.  No pallor, ict Cardiovascular: s1 s2 no m/r/g, rrr Respiratory:  Clear no added sound Abdomen:  Soft,  slightly distended, but no tenderness Skin no le edema Neuro-Bilateral equal strgn, no deficts noted otherwsie  Data Reviewed: Basic Metabolic Panel:  Recent Labs Lab 09/08/13 0440 09/10/13 0540 09/12/13 0537 09/13/13 0442 09/14/13 0412  NA 140 139 137 138 139  K 4.2 4.0 3.7 3.8 3.6*  CL 107 104 103 104 105  CO2 22 20 18* 18* 20  GLUCOSE 90 86 91 172* 152*  BUN 12 10 12 11 13   CREATININE 0.90 0.93 0.99 0.83 0.82  CALCIUM 8.4 7.9* 8.0* 8.2* 8.4   Liver Function Tests:  Recent Labs Lab 09/13/13 0442  AST 31  ALT 15  ALKPHOS 58  BILITOT <0.2*  PROT 5.7*  ALBUMIN 2.0*   No results found for this basename: LIPASE, AMYLASE,  in the last 168 hours No results found for this basename: AMMONIA,  in the last 168 hours CBC:  Recent Labs Lab 09/08/13 0440 09/10/13 0540 09/12/13 1058 09/13/13 0442 09/14/13 0412  WBC 12.2* 13.0* 10.3 7.6 13.1*  HGB 9.3* 10.0* 9.3* 9.3* 9.0*  HCT 27.7* 30.2* 28.2* 27.7* 27.1*  MCV 90.5 92.1 91.0 88.8 88.9  PLT 445* 370 308 328 331   Cardiac Enzymes: No results found for this basename: CKTOTAL, CKMB, CKMBINDEX, TROPONINI,  in the last 168 hours BNP: No components found with this basename: POCBNP,  CBG: No results found for this basename:  GLUCAP,  in the last 168 hours  Recent Results (from the past 240 hour(s))  CLOSTRIDIUM DIFFICILE BY PCR     Status: None   Collection Time    09/06/13  6:44 AM      Result Value Ref Range Status   C difficile by pcr NEGATIVE  NEGATIVE Final  CULTURE, ROUTINE-ABSCESS     Status: None   Collection Time    09/09/13  1:01 PM      Result Value Ref Range Status   Specimen Description DRAINAGE   Final   Special Requests Normal   Final   Gram Stain     Final   Value: ABUNDANT WBC PRESENT,BOTH PMN AND MONONUCLEAR     NO SQUAMOUS EPITHELIAL CELLS SEEN     MODERATE GRAM POSITIVE COCCI IN PAIRS     MODERATE GRAM POSITIVE RODS     Performed at [facility]   Culture     Final   Value: MODERATE  CANDIDA ALBICANS     Performed at [facility]   Report Status 09/11/2013 FINAL   Final     Studies:              All Imaging reviewed and is as per above notation   Scheduled Meds: . carvedilol  6.25 mg Oral BID WC  . fluconazole (DIFLUCAN) IV  400 mg Intravenous Q24H  . meropenem (MERREM) IV  1 g Intravenous Q12H  . multivitamins with iron  1 tablet Oral Daily  . pantoprazole  40 mg Oral Daily  . predniSONE  20 mg Oral BID WC  . temazepam  15 mg Oral QHS  . vitamin B-12  1,000 mcg Oral Daily   Continuous Infusions: . dextrose 5 % and 0.45% NaCl 10 mL/hr (09/14/13 0951)     Assessment/Plan:  1. Complicated diverticulitis-CT scan with contrast = Area 3.7 cm, IR following.  Defer further decision making to Gen. Surgery-cultures grew Mod Gr + cocci/+ Rods and Mod Candida.  Surgery and interventional radiology are managing drain. Continue curent Rx doses Diflucan 400 daily, Meropenem 1 gm bid -continue pain management. IVF KVO 5/12 2. Chronic atrial fibrillation, s/p PPM replacement 10/12/10 + NICM-for now continue Coreg 6.25 twice a day.  Appears to sound like she is in sinus rhythm. Elliquis on hold 3. Acute gouty arthritis-will start tapering prednisone from 50 every 6 hours to 20 twice a day. Will probably benefit from allopurinol and colchicine. 4. Decompensated acute diastolic CHF-now compensated-keep ins and outs equal and no Lasix at this time.  Daily weights ordered.  +1.0229 L 5. GERD-continue Protonix 40 daily 6. Insomnia-continue monitoring.  She has requested Benadryl which we will continue short-term  Code Status: Full Family Communication: No bedside Disposition Plan: Inpatient   [name], MD  [facility] Pager [phone] 09/14/2013, 4:25 PM    LOS: 9 days

## 2013-09-14 NOTE — Progress Notes (Signed)
Physical Therapy Treatment Patient Details Name: Kimberly [name] MRN: [id number] DOB: 1926-10-22 Today's Date: 09/14/2013    History of Present Illness Kimberly [name] is a 80 y.o. female with a past medical history of atrial fibrillation with pacemaker on chronic anticoagulation, arthritis, fibromyalgia, who has had lower abdominal discomfort for the past many months. Pt found to have pelvic abscess and drain placed 5/8. Pt with gout in rt foot which has now improved.    PT Comments    Patient agreeable to limited ambulation. Great niece (caregiver) in during session. Per note, patient not planning to DC anytime soon. Will continue to follow  Follow Up Recommendations  Home health PT (refusing SNF)     Equipment Recommendations  None recommended by PT    Recommendations for Other Services       Precautions / Restrictions Precautions Precautions: Fall    Mobility  Bed Mobility Overal bed mobility: Modified Independent                Transfers   Equipment used: Rolling walker (2 wheeled)   Sit to Stand: Min guard         General transfer comment: cues for proper technique for use of RW  Ambulation/Gait Ambulation/Gait assistance: Min guard Ambulation Distance (Feet): 50 Feet Assistive device: Rolling walker (2 wheeled)       General Gait Details: Verbal cues to stand more erect but pt reports flexed posture baseline. Patient stated that RW was too high. Unable to adjust but was able to put youth RW in room for next session   Stairs            Wheelchair Mobility    Modified Rankin (Stroke Patients Only)       Balance                                    Cognition Arousal/Alertness: Awake/alert Behavior During Therapy: WFL for tasks assessed/performed Overall Cognitive Status: Within Functional Limits for tasks assessed                      Exercises      General Comments        Pertinent Vitals/Pain Patient  complained of soreness all over    [place]                      Prior Function            PT Goals (current goals can now be found in the care plan section) Progress towards PT goals: Progressing toward goals    Frequency  Min 3X/week    PT Plan Current plan remains appropriate    Co-evaluation             End of Session Equipment Utilized During Treatment: Gait belt Activity Tolerance: Patient limited by fatigue;Patient limited by pain Patient left: in chair;with call bell/phone within reach;with family/visitor present     Time: [id number] PT Time Calculation (min): 19 min  Charges:  $Gait Training: 8-22 mins                    G Codes:      Tonia Brooms Bevan Disney 09/14/2013, 3:15 PM  09/14/2013 [place] PTA [phone] pager [phone] office

## 2013-09-14 NOTE — Progress Notes (Signed)
Cultures pending from abscess, continue abx as is for now, still with some pain and wbc did go up a little from yesterday, still think conservative mgt reasonable and hopefully abx will be enough. Can advance diet

## 2013-09-15 LAB — CBC WITH DIFFERENTIAL/PLATELET
Basophils Absolute: 0 10*3/uL (ref 0.0–0.1)
Basophils Relative: 0 % (ref 0–1)
Eosinophils Absolute: 0 10*3/uL (ref 0.0–0.7)
Eosinophils Relative: 0 % (ref 0–5)
HEMATOCRIT: 27.4 % — AB (ref 36.0–46.0)
HEMOGLOBIN: 9.1 g/dL — AB (ref 12.0–15.0)
LYMPHS PCT: 7 % — AB (ref 12–46)
Lymphs Abs: 0.9 10*3/uL (ref 0.7–4.0)
MCH: 29.4 pg (ref 26.0–34.0)
MCHC: 33.2 g/dL (ref 30.0–36.0)
MCV: 88.4 fL (ref 78.0–100.0)
MONOS PCT: 4 % (ref 3–12)
Monocytes Absolute: 0.5 10*3/uL (ref 0.1–1.0)
NEUTROS ABS: 11.3 10*3/uL — AB (ref 1.7–7.7)
NEUTROS PCT: 89 % — AB (ref 43–77)
Platelets: 318 10*3/uL (ref 150–400)
RBC: 3.1 MIL/uL — AB (ref 3.87–5.11)
RDW: 15.6 % — ABNORMAL HIGH (ref 11.5–15.5)
WBC: 12.7 10*3/uL — AB (ref 4.0–10.5)

## 2013-09-15 MED ORDER — FUROSEMIDE 20 MG PO TABS
20.0000 mg | ORAL_TABLET | Freq: Two times a day (BID) | ORAL | Status: DC
  Administered 2013-09-15 – 2013-09-16 (×2): 20 mg via ORAL
  Filled 2013-09-15 (×4): qty 1

## 2013-09-15 MED ORDER — ENSURE COMPLETE PO LIQD
237.0000 mL | Freq: Two times a day (BID) | ORAL | Status: DC
  Administered 2013-09-15: 237 mL via ORAL

## 2013-09-15 NOTE — Progress Notes (Signed)
Note: This document was prepared with digital dictation and possible smart phrase technology. Any transcriptional errors that result from this process are unintentional.   Kimberly [name] [id number] DOB: Feb 19, 1927 DOA: 09/05/2013 PCP: Anthoney Harada, MD  Brief narrative: 80 y/o ?, known h/o geographic ulceration distal 10 cm tubular? Barrett's, fibromyalgia, Chronic Afib H/o U GIB 10/11/12, Afib s/p PPM 10/12/10, H/o severe NICM, h/o chronic neck/ + T12 compression, Complex regional pain syndrome, admitted with first onset sigmoid diverticulitis + Microperforation-he had a  Ct guided perc drain placed 09/05/13 and was admitted to the Hospital for an acute gouty flare.  She was found to have episodes of diarrhea [cdiff was neg].  Rpt ct done showed abscess abdomen-Perc drain was placed by IR 09/09/13.  These cultures grew out mainly candida which I suspect is a contaminant.  Surgeru and IR following.  ID consutled  Past medical history-As per Problem list Chart reviewed as below- reviewed  Consultants:  IR  Surgery  Procedures:  Perc drain 5/4  CT c Contrast Residual right-sided pelvic abscess lying just superior to an  indwelling pigtail catheter. Maximum diameter of residual abscess  collection is 3.7 cm. No new abscess collections are identified   Antibiotics:  Meropenem 5/5-->5/10  Fluconazole 5/10  Flagyl 5/10   Subjective   Alert.  8/10 abd pain.  No n/v/cp Had milk, tea ice cream NO CP Feet feel swollen Otherwise stable    Objective    Interim History: none  Telemetry: Non tele   Objective: Filed Vitals:   09/14/13 0710 09/14/13 1400 09/14/13 2149 09/15/13 0510  BP:  141/70 139/81 135/76  Pulse:  69 77 76  Temp:  97.3 F (36.3 C) 97.6 F (36.4 C) 97.5 F (36.4 C)  TempSrc:   Oral Oral  Resp:  16 16 16   Height:      Weight: 69.627 kg (153 lb 8 oz)   69.6 kg (153 lb 7 oz)  SpO2:  100% 100% 100%    Intake/Output Summary (Last 24 hours) at  09/15/13 1350 Last data filed at 09/15/13 0900  Gross per 24 hour  Intake   1085 ml  Output      5 ml  Net   1080 ml    Exam:  General: eomi, ncat.  No pallor, ict Cardiovascular: s1 s2 no m/r/g, rrr Respiratory:  Clear no added sound Abdomen:  Soft, slightly distended, but no tenderness Skin no le edema Neuro-Bilateral equal strgn, no deficts noted otherwsie  Data Reviewed: Basic Metabolic Panel:  Recent Labs Lab 09/10/13 0540 09/12/13 0537 09/13/13 0442 09/14/13 0412  NA 139 137 138 139  K 4.0 3.7 3.8 3.6*  CL 104 103 104 105  CO2 20 18* 18* 20  GLUCOSE 86 91 172* 152*  BUN 10 12 11 13   CREATININE 0.93 0.99 0.83 0.82  CALCIUM 7.9* 8.0* 8.2* 8.4   Liver Function Tests:  Recent Labs Lab 09/13/13 0442  AST 31  ALT 15  ALKPHOS 58  BILITOT <0.2*  PROT 5.7*  ALBUMIN 2.0*   No results found for this basename: LIPASE, AMYLASE,  in the last 168 hours No results found for this basename: AMMONIA,  in the last 168 hours CBC:  Recent Labs Lab 09/10/13 0540 09/12/13 1058 09/13/13 0442 09/14/13 0412 09/15/13 0556  WBC 13.0* 10.3 7.6 13.1* 12.7*  NEUTROABS  --   --   --   --  11.3*  HGB 10.0* 9.3* 9.3* 9.0* 9.1*  HCT 30.2* 28.2*  27.7* 27.1* 27.4*  MCV 92.1 91.0 88.8 88.9 88.4  PLT 370 308 328 331 318   Cardiac Enzymes: No results found for this basename: CKTOTAL, CKMB, CKMBINDEX, TROPONINI,  in the last 168 hours BNP: No components found with this basename: POCBNP,  CBG: No results found for this basename: GLUCAP,  in the last 168 hours  Recent Results (from the past 240 hour(s))  CLOSTRIDIUM DIFFICILE BY PCR     Status: None   Collection Time    09/06/13  6:44 AM      Result Value Ref Range Status   C difficile by pcr NEGATIVE  NEGATIVE Final  CULTURE, ROUTINE-ABSCESS     Status: None   Collection Time    09/09/13  1:01 PM      Result Value Ref Range Status   Specimen Description DRAINAGE   Final   Special Requests Normal   Final   Gram Stain      Final   Value: ABUNDANT WBC PRESENT,BOTH PMN AND MONONUCLEAR     NO SQUAMOUS EPITHELIAL CELLS SEEN     MODERATE GRAM POSITIVE COCCI IN PAIRS     MODERATE GRAM POSITIVE RODS     Performed at [facility]   Culture     Final   Value: MODERATE CANDIDA ALBICANS     Performed at [facility]   Report Status 09/11/2013 FINAL   Final     Studies:              All Imaging reviewed and is as per above notation   Scheduled Meds: . carvedilol  6.25 mg Oral BID WC  . fluconazole (DIFLUCAN) IV  400 mg Intravenous Q24H  . meropenem (MERREM) IV  1 g Intravenous Q12H  . multivitamins with iron  1 tablet Oral Daily  . pantoprazole  40 mg Oral Daily  . predniSONE  20 mg Oral BID WC  . temazepam  15 mg Oral QHS  . vitamin B-12  1,000 mcg Oral Daily   Continuous Infusions: . dextrose 5 % and 0.45% NaCl 10 mL/hr (09/14/13 0951)     Assessment/Plan:  1. Complicated diverticulitis-CT scan with contrast = Area 3.7 cm, IR following.  Defer further decision making to Gen. Surgery-cultures grew Mod Gr + cocci/+ Rods and Mod Candida.  Surgery and interventional radiology are managing drain. Continue curent Rx doses Diflucan 400 daily, Meropenem 1 gm bid--Discussed with ID Dr. Tonie Griffith.  Will continue Antibiotics until repeat CT--May need Surgical intervention eventually-Will place PICC -continue pain management. IVF KVO 5/12. 2. Chronic atrial fibrillation, s/p PPM replacement 10/12/10 + NICM-for now continue Coreg 6.25 twice a day.  Appears to sound like she is in sinus rhythm. Elliquis on hold 3. Acute gouty arthritis-will start tapering prednisone from 50 every 6 hours to 20 twice a day-->discontinued 5/14.  Will probably benefit from allopurinol and colchicine. 4. Decompensated acute diastolic CHF-Started back PO home lasix  Daily weights ordered.  +2.7 5. GERD-continue Protonix 40 daily 6. Insomnia-continue monitoring.  She has requested Benadryl which we will continue short-term  Code  Status: Full Family Communication: No bedside Disposition Plan: Inpatient   [name], MD  [facility] Pager [phone] 09/15/2013, 1:50 PM    LOS: 10 days

## 2013-09-15 NOTE — Progress Notes (Signed)
  Subjective: Pelvic abscess drain placed 5/8 CT 5/12 shows collection smaller but remains 3.7 cm Pt feeling ok this morning, sitting up in bed eating full liquids.  Objective: Vital signs in last 24 hours: Temp:  [97.3 F (36.3 C)-97.6 F (36.4 C)] 97.5 F (36.4 C) (05/14 0510) Pulse Rate:  [69-77] 76 (05/14 0510) Resp:  [16] 16 (05/14 0510) BP: (135-141)/(70-81) 135/76 mmHg (05/14 0510) SpO2:  [100 %] 100 % (05/14 0510) Weight:  [153 lb 7 oz (69.6 kg)] 153 lb 7 oz (69.6 kg) (05/14 0510) Last BM Date: 09/14/13  Site of drain clean and dry; tender Abd: tender low abd; no masses Output 35 cc purulent fluid yesterday; Drain flushed easily with 5cc NS, return of milky yellow purulent fluid   Lab Results:   Recent Labs  09/14/13 0412 09/15/13 0556  WBC 13.1* 12.7*  HGB 9.0* 9.1*  HCT 27.1* 27.4*  PLT 331 318   BMET  Recent Labs  09/13/13 0442 09/14/13 0412  NA 138 139  K 3.8 3.6*  CL 104 105  CO2 18* 20  GLUCOSE 172* 152*  BUN 11 13  CREATININE 0.83 0.82  CALCIUM 8.2* 8.4   PT/INR  Recent Labs  09/13/13 0442  LABPROT 14.5  INR 1.15   Anti-infectives: Anti-infectives   Start     Dose/Rate Route Frequency Ordered Stop   09/13/13 1200  fluconazole (DIFLUCAN) IVPB 400 mg     400 mg 100 mL/hr over 120 Minutes Intravenous Every 24 hours 09/13/13 0919     09/12/13 1200  fluconazole (DIFLUCAN) IVPB 100 mg  Status:  Discontinued     100 mg 50 mL/hr over 60 Minutes Intravenous Every 24 hours 09/11/13 1102 09/13/13 0919   09/11/13 1200  fluconazole (DIFLUCAN) IVPB 200 mg     200 mg 100 mL/hr over 60 Minutes Intravenous  Once 09/11/13 1102 09/11/13 1252   09/11/13 1200  metroNIDAZOLE (FLAGYL) IVPB 500 mg  Status:  Discontinued     500 mg 100 mL/hr over 60 Minutes Intravenous Every 8 hours 09/11/13 1108 09/13/13 0919   09/10/13 1115  meropenem (MERREM) 1 g in sodium chloride 0.9 % 100 mL IVPB     1 g 200 mL/hr over 30 Minutes Intravenous Every 12 hours  09/10/13 1114     09/06/13 1800  meropenem (MERREM) 1 g in sodium chloride 0.9 % 100 mL IVPB  Status:  Discontinued     1 g 200 mL/hr over 30 Minutes Intravenous Every 12 hours 09/06/13 1629 09/10/13 0725   09/05/13 1600  metroNIDAZOLE (FLAGYL) tablet 500 mg  Status:  Discontinued     500 mg Oral 3 times daily 09/05/13 1257 09/07/13 [id number]      Assessment/Plan:  Diverticular abscess from microperf with perc drain intact CT 5/12- shows good position and smaller collection--but still 3.7 cm Continue drain with tid flushes   LOS: 10 days    Ascencion Dike 09/15/2013

## 2013-09-15 NOTE — Progress Notes (Signed)
NUTRITION FOLLOW-UP  INTERVENTION: Ensure Complete po BID, each supplement provides 350 kcal and 13 grams of protein  NUTRITION DIAGNOSIS: Increased nutrient needs related to abscess as evidenced by estimated needs; ongoing.   Goal: Pt to meet >/= 90% of their estimated nutrition needs, not met.   Monitor:  Diet advancement, weight trend, PO intake, supplement accpetance   ASSESSMENT: Diverticulitis with Microperf (Admitted AP 08/24/13-08/28/13)  Now with Recurrent sigmoid diverticulitis with perforation and worsened abscess (4 x 3.8 x 3.4cm) s/p perc drain.   Diet advancing. Pt now on soft diet and tolerating.  Drain still in place. Positive cultures, pt with PICC for continued antibiotics.   Height: Ht Readings from Last 1 Encounters:  09/09/13 5' 7"  (1.702 m)    Weight: Wt Readings from Last 1 Encounters:  09/15/13 153 lb 7 oz (69.6 kg)  Admission weight 142 lb (64.5 kg) 5/4 Weight has varied 140-150 lb this admission   BMI:  Body mass index is 24.03 kg/(m^2).  Estimated Nutritional Needs: Kcal: 1500-1700 Protein: 75-85 grams Fluid: > 1.5 L/day  Skin: no issues noted  Diet Order: Criss Rosales   Intake/Output Summary (Last 24 hours) at 09/15/13 1519 Last data filed at 09/15/13 0900  Gross per 24 hour  Intake   1085 ml  Output      5 ml  Net   1080 ml    Last BM: 5/14   Labs:   Recent Labs Lab 09/12/13 0537 09/13/13 0442 09/14/13 0412  NA 137 138 139  K 3.7 3.8 3.6*  CL 103 104 105  CO2 18* 18* 20  BUN 12 11 13   CREATININE 0.99 0.83 0.82  CALCIUM 8.0* 8.2* 8.4  GLUCOSE 91 172* 152*    CBG (last 3)  No results found for this basename: GLUCAP,  in the last 72 hours  Scheduled Meds: . carvedilol  6.25 mg Oral BID WC  . fluconazole (DIFLUCAN) IV  400 mg Intravenous Q24H  . furosemide  20 mg Oral BID  . meropenem (MERREM) IV  1 g Intravenous Q12H  . multivitamins with iron  1 tablet Oral Daily  . pantoprazole  40 mg Oral Daily  . temazepam  15 mg  Oral QHS  . vitamin B-12  1,000 mcg Oral Daily    Continuous Infusions: . dextrose 5 % and 0.45% NaCl 10 mL/hr (09/14/13 0951)     [place], [place], CNSC [phone] Pager [phone] After Hours Pager

## 2013-09-15 NOTE — Care Management Note (Signed)
CARE MANAGEMENT NOTE 09/15/2013  Patient:  Kimberly [name], Kimberly [name]   Account Number:  [id number]  Date Initiated:  09/15/2013  Documentation initiated by:  Ricki Miller  Subjective/Objective Assessment:   80 yr old female admitted on 09/05/13 with right great toe infection.  09/09/13 has colonic abscess.     Action/Plan:   Case manager spoke with patient concerning need for shortterm rehab and for IV antibiotics at SNF. Patient is agreeable to discuss with Education officer, museum. Lives alone and has no family support.Social worker notified.   Anticipated DC Date:  09/16/2013   Anticipated DC Plan:  SKILLED NURSING FACILITY  In-house referral  Clinical Social Worker         Choice offered to / List presented to:             Status of service:  In process, will continue to follow Medicare Important Message given?  YES (If response is "NO", the following Medicare IM given date fields will be blank) Date Medicare IM given:  09/05/2013 Date Additional Medicare IM given:  09/15/2013  Discharge Disposition:  [place]

## 2013-09-15 NOTE — Progress Notes (Signed)
Subjective: Pt still c/o pain; however, doesn't seem very tender.  Tolerating diet.  Would like solid food.  Drain putting out very little.  Wants to go home with great niece and not to SNF.  No fevers/chills, N/V/D.  Had a BM on 09/14/13.  Objective: Vital signs in last 24 hours: Temp:  [97.3 F (36.3 C)-97.6 F (36.4 C)] 97.5 F (36.4 C) (05/14 0510) Pulse Rate:  [69-77] 76 (05/14 0510) Resp:  [16] 16 (05/14 0510) BP: (135-141)/(70-81) 135/76 mmHg (05/14 0510) SpO2:  [100 %] 100 % (05/14 0510) Weight:  [153 lb 7 oz (69.6 kg)] 153 lb 7 oz (69.6 kg) (05/14 0510) Last BM Date: 09/14/13  Intake/Output from previous day: 05/13 0701 - 05/14 0700 In: 1685 [P.O.:1680] Out: 5 [Drains:5] Intake/Output this shift:    PE: Gen:  Alert, NAD, pleasant Card:  RRR, no M/G/R heard Pulm:  CTA, no W/R/R Abd: Soft, NT/ND, +BS, no HSM, drain with minimal yellow/tan cloudy drainage (29mL/24 hours)   Lab Results:   Recent Labs  09/14/13 0412 09/15/13 0556  WBC 13.1* 12.7*  HGB 9.0* 9.1*  HCT 27.1* 27.4*  PLT 331 318   BMET  Recent Labs  09/13/13 0442 09/14/13 0412  NA 138 139  K 3.8 3.6*  CL 104 105  CO2 18* 20  GLUCOSE 172* 152*  BUN 11 13  CREATININE 0.83 0.82  CALCIUM 8.2* 8.4   PT/INR  Recent Labs  09/13/13 0442  LABPROT 14.5  INR 1.15   CMP     Component Value Date/Time   NA 139 09/14/2013 0412   NA 141 05/30/2013 1803   K 3.6* 09/14/2013 0412   CL 105 09/14/2013 0412   CO2 20 09/14/2013 0412   GLUCOSE 152* 09/14/2013 0412   GLUCOSE 86 05/30/2013 1803   BUN 13 09/14/2013 0412   BUN 17 05/30/2013 1803   CREATININE 0.82 09/14/2013 0412   CREATININE 1.48* 10/11/2012 1134   CALCIUM 8.4 09/14/2013 0412   PROT 5.7* 09/13/2013 0442   PROT 6.5 05/30/2013 1803   ALBUMIN 2.0* 09/13/2013 0442   AST 31 09/13/2013 0442   ALT 15 09/13/2013 0442   ALKPHOS 58 09/13/2013 0442   BILITOT <0.2* 09/13/2013 0442   GFRNONAA 64* 09/14/2013 0412   GFRNONAA 33* 10/11/2012 1134   GFRAA 74*  09/14/2013 0412   GFRAA 37* 10/11/2012 1134   Lipase     Component Value Date/Time   LIPASE 29 08/24/2013 1753       Studies/Results: No results found.  Anti-infectives: Anti-infectives   Start     Dose/Rate Route Frequency Ordered Stop   09/13/13 1200  fluconazole (DIFLUCAN) IVPB 400 mg     400 mg 100 mL/hr over 120 Minutes Intravenous Every 24 hours 09/13/13 0919     09/12/13 1200  fluconazole (DIFLUCAN) IVPB 100 mg  Status:  Discontinued     100 mg 50 mL/hr over 60 Minutes Intravenous Every 24 hours 09/11/13 1102 09/13/13 0919   09/11/13 1200  fluconazole (DIFLUCAN) IVPB 200 mg     200 mg 100 mL/hr over 60 Minutes Intravenous  Once 09/11/13 1102 09/11/13 1252   09/11/13 1200  metroNIDAZOLE (FLAGYL) IVPB 500 mg  Status:  Discontinued     500 mg 100 mL/hr over 60 Minutes Intravenous Every 8 hours 09/11/13 1108 09/13/13 0919   09/10/13 1115  meropenem (MERREM) 1 g in sodium chloride 0.9 % 100 mL IVPB     1 g 200 mL/hr over 30 Minutes Intravenous Every 12  hours 09/10/13 1114     09/06/13 1800  meropenem (MERREM) 1 g in sodium chloride 0.9 % 100 mL IVPB  Status:  Discontinued     1 g 200 mL/hr over 30 Minutes Intravenous Every 12 hours 09/06/13 1629 09/10/13 0725   09/05/13 1600  metroNIDAZOLE (FLAGYL) tablet 500 mg  Status:  Discontinued     500 mg Oral 3 times daily 09/05/13 1257 09/07/13 [id number]       Assessment/Plan Diverticulitis with Microperf (Admitted AP 08/24/13-08/28/13)  Now with Recurrent sigmoid diverticulitis with perforation and worsened abscess (4 x 3.8 x 3.4cm) s/p perc drain  Leukocytosis - minimally improved today down to 12.7 Large hiatal hernia   Plan:  1. CBC still high. CT scan shows 3.7cm residual abscess just superior to pigtail catheter in right pelvis  2. Tolerating clears, advance to fulls  3. SCD's, not currently on DVT pharm proph.....could be on heparin or lovenox for dvt prophylaxis per primary service  4. Ambulate and IS  5. Not able to do  drain study due to patients iodine allergy, hopefully she will continue to improve with antibiotics alone. No surgical interventions planned at this time. Drain continues to not drain much, nurses are flushing catheter with minimal results  6. Continue Meropenem Day 9 - for gram + rods & cocci, Diflucan Day 5 - for candida in culture  7. Would recommend she be sent home with IV antibiotics and PICC for 2 weeks, when otherwise medically stable for discharge 8. Looks like therapies are recommending SNF at discharge, she wants to go home with her great niece, Dispo per primary service     LOS: 10 days    Coralie Keens 09/15/2013, 8:45 AM Pager: [phone]

## 2013-09-15 NOTE — Progress Notes (Signed)
If she tolerates food I think she could go home on iv abx and follow up with ct in our office in a week or so.

## 2013-09-15 NOTE — Progress Notes (Signed)
Consent obtained from patient for PICC line insertion after discussion including risks, benefits, and alternatives. Patient requested waiting until AM. Primary RN notified.

## 2013-09-16 ENCOUNTER — Inpatient Hospital Stay (HOSPITAL_COMMUNITY): Payer: Medicare Other

## 2013-09-16 LAB — CBC
HCT: 27.7 % — ABNORMAL LOW (ref 36.0–46.0)
HEMOGLOBIN: 9.2 g/dL — AB (ref 12.0–15.0)
MCH: 30.4 pg (ref 26.0–34.0)
MCHC: 33.2 g/dL (ref 30.0–36.0)
MCV: 91.4 fL (ref 78.0–100.0)
Platelets: 288 10*3/uL (ref 150–400)
RBC: 3.03 MIL/uL — AB (ref 3.87–5.11)
RDW: 15.8 % — ABNORMAL HIGH (ref 11.5–15.5)
WBC: 11.3 10*3/uL — ABNORMAL HIGH (ref 4.0–10.5)

## 2013-09-16 MED ORDER — SODIUM CHLORIDE 0.9 % IJ SOLN: 10.0000 mL | Freq: Two times a day (BID) | INTRAMUSCULAR | Status: DC

## 2013-09-16 MED ORDER — SODIUM CHLORIDE 0.9 % IJ SOLN
10.0000 mL | INTRAMUSCULAR | Status: DC | PRN
  Administered 2013-09-16: 10 mL

## 2013-09-16 MED ORDER — HYDROCODONE-ACETAMINOPHEN 5-325 MG PO TABS: 1.0000 | ORAL_TABLET | Freq: Four times a day (QID) | ORAL | Status: DC | PRN

## 2013-09-16 MED ORDER — FLUCONAZOLE IN SODIUM CHLORIDE 400-0.9 MG/200ML-% IV SOLN: 400.0000 mg | INTRAVENOUS | Status: DC

## 2013-09-16 MED ORDER — HEPARIN SOD (PORK) LOCK FLUSH 100 UNIT/ML IV SOLN: 250.0000 [IU] | INTRAVENOUS | Status: DC | PRN

## 2013-09-16 MED ORDER — SODIUM CHLORIDE 0.9 % IV SOLN: 1.0000 g | Freq: Two times a day (BID) | INTRAVENOUS | Status: DC

## 2013-09-16 NOTE — Progress Notes (Signed)
CSW (Clinical Education officer, museum) made aware that pt is now agreeable to SNF. CSW visited pt room and confirmed with pt that dc plan will be SNF. CSW explained SNF referral process and pt is agreeable to being faxed out to [facility] with the exception of leaving one facility off. CSW did explain that pt is ready for dc today and will need to choose from available facilities.  [place], [place]

## 2013-09-16 NOTE — Progress Notes (Signed)
CSW (Clinical Education officer, museum) prepared pt dc packet and placed with shadow chart. CSW arranged non-emergent ambulance transport. Pt, pt nurse, and facility informed. CSW signing off.   [place], [place]

## 2013-09-16 NOTE — Progress Notes (Signed)
Peripherally Inserted Central Catheter/Midline Placement  The IV Nurse has discussed with the patient and/or persons authorized to consent for the patient, the purpose of this procedure and the potential benefits and risks involved with this procedure.  The benefits include less needle sticks, lab draws from the catheter and patient may be discharged home with the catheter.  Risks include, but not limited to, infection, bleeding, blood clot (thrombus formation), and puncture of an artery; nerve damage and irregular heat beat.  Alternatives to this procedure were also discussed.  PICC/Midline Placement Documentation  PICC / Midline Single Lumen 09/16/13 PICC Right Brachial 36 cm 0 cm (Active)  Indication for Insertion or Continuance of Line Home intravenous therapies (PICC only) 09/16/2013  9:07 AM  Exposed Catheter (cm) 0 cm 09/16/2013  9:07 AM  Line Status Flushed;Saline locked;Blood return noted 09/16/2013  9:07 AM  Dressing Change Due 09/23/13 09/16/2013  9:07 AM       [facility] 09/16/2013, 9:09 AM

## 2013-09-16 NOTE — Progress Notes (Signed)
Subjective: Pt c/o pain in LLQ.  No N/V.  Having BM's and flatus.  Urinating well.   Mobilizing with PT.  Plans to d/c today to SNF.    Objective: Vital signs in last 24 hours: Temp:  [97.3 F (36.3 C)-97.8 F (36.6 C)] 97.8 F (36.6 C) (05/15 0631) Pulse Rate:  [70-75] 72 (05/15 0631) Resp:  [16-18] 18 (05/15 0631) BP: (151-156)/(78-87) 156/87 mmHg (05/15 0631) SpO2:  [99 %-100 %] 99 % (05/15 0631) Weight:  [154 lb 8.7 oz (70.1 kg)] 154 lb 8.7 oz (70.1 kg) (05/15 0500) Last BM Date: 09/15/13  Intake/Output from previous day: 05/14 0701 - 05/15 0700 In: 645 [P.O.:240; IV Piggyback:400] Out: 20 [Drains:20] Intake/Output this shift:    PE: Gen:  Alert, NAD, pleasant Card:  RRR, no M/G/R heard Pulm:  CTA, no W/R/R Abd: Soft, mild tenderness in the RLQ/suprapubic, +BS, no HSM, drains with yellow/tan purulent drainage about (37mL/24hour)  Lab Results:   Recent Labs  09/15/13 0556 09/16/13 0432  WBC 12.7* 11.3*  HGB 9.1* 9.2*  HCT 27.4* 27.7*  PLT 318 288   BMET  Recent Labs  09/14/13 0412  NA 139  K 3.6*  CL 105  CO2 20  GLUCOSE 152*  BUN 13  CREATININE 0.82  CALCIUM 8.4   PT/INR No results found for this basename: LABPROT, INR,  in the last 72 hours CMP     Component Value Date/Time   NA 139 09/14/2013 0412   NA 141 05/30/2013 1803   K 3.6* 09/14/2013 0412   CL 105 09/14/2013 0412   CO2 20 09/14/2013 0412   GLUCOSE 152* 09/14/2013 0412   GLUCOSE 86 05/30/2013 1803   BUN 13 09/14/2013 0412   BUN 17 05/30/2013 1803   CREATININE 0.82 09/14/2013 0412   CREATININE 1.48* 10/11/2012 1134   CALCIUM 8.4 09/14/2013 0412   PROT 5.7* 09/13/2013 0442   PROT 6.5 05/30/2013 1803   ALBUMIN 2.0* 09/13/2013 0442   AST 31 09/13/2013 0442   ALT 15 09/13/2013 0442   ALKPHOS 58 09/13/2013 0442   BILITOT <0.2* 09/13/2013 0442   GFRNONAA 64* 09/14/2013 0412   GFRNONAA 33* 10/11/2012 1134   GFRAA 74* 09/14/2013 0412   GFRAA 37* 10/11/2012 1134   Lipase     Component Value Date/Time    LIPASE 29 08/24/2013 1753       Studies/Results: No results found.  Anti-infectives: Anti-infectives   Start     Dose/Rate Route Frequency Ordered Stop   09/13/13 1200  fluconazole (DIFLUCAN) IVPB 400 mg     400 mg 100 mL/hr over 120 Minutes Intravenous Every 24 hours 09/13/13 0919     09/12/13 1200  fluconazole (DIFLUCAN) IVPB 100 mg  Status:  Discontinued     100 mg 50 mL/hr over 60 Minutes Intravenous Every 24 hours 09/11/13 1102 09/13/13 0919   09/11/13 1200  fluconazole (DIFLUCAN) IVPB 200 mg     200 mg 100 mL/hr over 60 Minutes Intravenous  Once 09/11/13 1102 09/11/13 1252   09/11/13 1200  metroNIDAZOLE (FLAGYL) IVPB 500 mg  Status:  Discontinued     500 mg 100 mL/hr over 60 Minutes Intravenous Every 8 hours 09/11/13 1108 09/13/13 0919   09/10/13 1115  meropenem (MERREM) 1 g in sodium chloride 0.9 % 100 mL IVPB     1 g 200 mL/hr over 30 Minutes Intravenous Every 12 hours 09/10/13 1114     09/06/13 1800  meropenem (MERREM) 1 g in sodium chloride 0.9 % 100  mL IVPB  Status:  Discontinued     1 g 200 mL/hr over 30 Minutes Intravenous Every 12 hours 09/06/13 1629 09/10/13 0725   09/05/13 1600  metroNIDAZOLE (FLAGYL) tablet 500 mg  Status:  Discontinued     500 mg Oral 3 times daily 09/05/13 1257 09/07/13 [id number]       Assessment/Plan Diverticulitis with Microperf (Admitted AP 08/24/13-08/28/13)  Now with Recurrent sigmoid diverticulitis with perforation and worsened abscess (4 x 3.8 x 3.4cm) s/p perc drain  Leukocytosis - minimally improved today down to 11.3 Large hiatal hernia   Plan:  1. CBC improving. CT scan shows 3.7cm residual abscess just superior to pigtail catheter in right pelvis  2. Tolerated soft diet today, needs to be on low residue diet at SNF 3. SCD's, not currently on DVT pharm proph.....could be on heparin or lovenox for dvt prophylaxis per primary service  4. Ambulate and IS  5. Not able to do drain study due to patients iodine allergy, seems to be  improving with antibiotics 6. Continue Meropenem Day 10 - for gram + rods & cocci, Diflucan Day 6 - for candida in culture  7. Would recommend she be sent home with IV antibiotics and PICC for 2 weeks, when otherwise medically stable for discharge  8. Looks like she may be going to SNF today, [place] from our perspective, dispo per primary service 9. Follow up with Dr. Lucia Gaskins in 7-10 days for repeat CT scan    LOS: 11 days    [facility] 09/16/2013, 7:47 AM Pager: [phone]

## 2013-09-16 NOTE — Progress Notes (Addendum)
Clinical Social Work Department CLINICAL SOCIAL WORK PLACEMENT NOTE 09/16/2013  Patient:  Kimberly [name], Kimberly [name]  Account Number:  [id number] Admit date:  09/05/2013  Clinical Social Worker:  Berton Mount, Latanya Presser  Date/time:  09/16/2013 01:30 PM  Clinical Social Work is seeking post-discharge placement for this patient at the following level of care:   SKILLED NURSING   (*CSW will update this form in Epic as items are completed)   09/16/2013  Patient/family provided with [facility] Department of Clinical Social Work's list of facilities offering this level of care within the geographic area requested by the patient (or if unable, by the patient's family).  09/16/2013  Patient/family informed of their freedom to choose among providers that offer the needed level of care, that participate in Medicare, Medicaid or managed care program needed by the patient, have an available bed and are willing to accept the patient.  09/16/2013  Patient/family informed of [facility]' ownership interest in [facility], as well as of the fact that they are under no obligation to receive care at this facility.  PASARR submitted to [employer] on  PASARR number received from [place] on   FL2 transmitted to all facilities in geographic area requested by pt/family on  09/16/2013 FL2 transmitted to all facilities within larger geographic area on   Patient informed that his/her managed care company has contracts with or will negotiate with  certain facilities, including the following:     Patient/family informed of bed offers received:  09/16/2013 Patient chooses bed at [facility] Physician recommends and patient chooses bed at    Patient to be transferred [facility]  On 09/16/2013   Patient to be transferred to facility by [facility]  The following physician request were entered in Epic:   Additional Comments:   [place], [place]

## 2013-09-16 NOTE — Progress Notes (Signed)
PT Cancellation Note  Patient Details Name: Kimberly [name] MRN: [id number] DOB: 1926-09-14   Cancelled Treatment:    Reason Eval/Treat Not Completed: Medical issues which prohibited therapy. Patient had just received PICC. Per RN and Xray, PICC placement is coiled and IV team has to come and correct. At this time patient request to hold PT til completed. Will attempt later this afternoon as schedule allows   [place] 09/16/2013, 9:59 AM

## 2013-09-16 NOTE — Progress Notes (Signed)
Wbc continues to improve, pain at drain site, having bowel function tolerating diet, I think reasonable to dc on iv abx, return as above, plan discussed with dr Lucia Gaskins. Ideally getting her off prednisone as quickly as possible would be beneficial, she still is at risk of needing surgery for this but hopefully will resolve.

## 2013-09-16 NOTE — Progress Notes (Signed)
Report called to [facility]

## 2013-09-16 NOTE — Discharge Summary (Signed)
Physician Discharge Summary  Kimberly [name] [id number] DOB: 05-27-1926 DOA: 09/05/2013  PCP: Anthoney Harada, MD  Admit date: 09/05/2013 Discharge date: 09/16/2013  Time spent: 45 minutes  Recommendations for Outpatient Follow-up:  1. Needs CBC and BMet in about 1 week. 2. Consider Holding her elliquis 24 hours before she goes to see surgery as she might need to have a surgical procedure done-this was re-started on day of discharge 3. Monitor drain out-put from Percutaneous abdominal drain-notify Interventional Radiology if there is decreased output from this  4. She will need SNF level care as has drain, IV abx and skilled need 5. Contin both fluconazole and Meropenem for at least 14 more days [stop date 09/30/13] or until decisions about abd surgery are made as OP by Dr. Lucia Gaskins 6. Consider increasing her pain regemin on d/c if suboptimals pain control 7. She might need some adjustment of her lasix doses upwards as OP as she was Net + 2-3 liters this admit   Discharge Diagnoses:  Principal Problem:   Colonic diverticular abscess Active Problems:   Atrial fibrillation   Pacemaker   Hypertension   Hypokalemia   CKD (chronic kidney disease) stage 3, GFR 30-59 ml/min   Chronic anticoagulation   Acute diverticulitis   Anemia   Acute gouty arthritis   Inability to ambulate due to ankle or foot   Diarrhea   Discharge Condition: fair  Diet recommendation: reg  Filed Weights   09/14/13 0710 09/15/13 0510 09/16/13 0500  Weight: 69.627 kg (153 lb 8 oz) 69.6 kg (153 lb 7 oz) 70.1 kg (154 lb 8.7 oz)    History of present illness:  80 y/o ?, known h/o geographic ulceration distal 10 cm tubular? Barrett's, fibromyalgia, Chronic Afib H/o U GIB 10/11/12, Afib s/p PPM 10/12/10, H/o severe NICM, h/o chronic neck/ + T12 compression, Complex regional pain syndrome, admitted with first onset sigmoid diverticulitis + Microperforation-he had a Ct guided perc drain placed 09/05/13 and was admitted  to the Hospital for an acute gouty flare. She was found to have episodes of diarrhea [cdiff was neg]. Rpt ct done showed abscess abdomen-Perc drain was placed by IR 09/09/13. These cultures grew out mainly candida which I suspect is a contaminant. Surgeru and IR following. ID consutled See below  Hospital Course:   1. Complicated diverticulitis-CT scan with contrast = Area 3.7 cm, IR following. Defer further decision making to Gen. Surgery-cultures grew Mod Gr + cocci/+ Rods and Mod Candida. Surgery and interventional radiology are managing drain. Continue curent Rx doses Diflucan 400 daily, Meropenem 1 gm bid--Discussed with ID Dr. Tonie Griffith. Will continue Antibiotics until repeat CT--May need Surgical intervention eventually-Placed PICC 5/14 for extended duration of Abx therapy need-continue pain management with oral pain meds and as OP will need to have this upwards adjusted if prn. IVF KVO 5/12. 2. Chronic atrial fibrillation, s/p PPM replacement 10/12/10 + NICM-for now continue Coreg 6.25 twice a day. Appears to sound like she is in sinus rhythm. Elliquis re-styaretd on day of d./c-consider holding this 1 day prior to surgical visit in case needs Surgery 3. Acute gouty arthritis-will start tapering prednisone from 50 every 6 hours to 20 twice a day-->discontinued completely 5/14. Will probably benefit from allopurinol and colchicine.Her leukocytosis subsequently dropped a little more 4. Decompensated acute diastolic CHF-Started back PO home lasix Daily weights ordered. +3 liters this admit.  Weight however stable 5. GERD-continue Protonix 40 daily 6. Insomnia-continue monitoring.   Consultants:  IR  Surgery Curbsided ID Procedures:  Perc drain 5/4  CT c Contrast Residual right-sided pelvic abscess lying just superior to an  indwelling pigtail catheter. Maximum diameter of residual abscess  collection is 3.7 cm. No new abscess collections are identified  Antibiotics:  Meropenem  5/5-->5/29 Fluconazole 5/10 -5/29 Flagyl 5/10   Discharge Exam: Filed Vitals:   09/16/13 0631  BP: 156/87  Pulse: 72  Temp: 97.8 F (36.6 C)  Resp: 18    General: eomi, ncat no issues Cardiovascular: s1 s2 no m/r/g Respiratory: clear, no added sound  Discharge Instructions You were cared for by a hospitalist during your hospital stay. If you have any questions about your discharge medications or the care you received while you were in the hospital after you are discharged, you can call the unit and asked to speak with the hospitalist on call if the hospitalist that took care of you is not available. Once you are discharged, your primary care physician will handle any further medical issues. Please note that NO REFILLS for any discharge medications will be authorized once you are discharged, as it is imperative that you return to your primary care physician (or establish a relationship with a primary care physician if you do not have one) for your aftercare needs so that they can reassess your need for medications and monitor your lab values.  Discharge Instructions   Diet - low sodium heart healthy    Complete by:  As directed      Increase activity slowly    Complete by:  As directed             Medication List    STOP taking these medications       ciprofloxacin 500 MG tablet  Commonly known as:  CIPRO     metroNIDAZOLE 500 MG tablet  Commonly known as:  FLAGYL      TAKE these medications       carvedilol 6.25 MG tablet  Commonly known as:  COREG  Take 1 tablet (6.25 mg total) by mouth 2 (two) times daily with a meal.     Doxepin HCl 3 MG Tabs  Take 1 tablet by mouth at bedtime.     ELIQUIS 2.5 MG Tabs tablet  Generic drug:  apixaban  Take 2.5 mg by mouth 2 (two) times daily.     fluconazole 400-0.9 MG/200ML-% IVPB  Commonly known as:  DIFLUCAN  Inject 200 mLs (400 mg total) into the vein daily.     furosemide 20 MG tablet  Commonly known as:  LASIX   Take 2 tablets by mouth  daily     HYDROcodone-acetaminophen 5-325 MG per tablet  Commonly known as:  NORCO/VICODIN  Take 1-2 tablets by mouth every 6 (six) hours as needed for moderate pain.     meropenem 1 g in sodium chloride 0.9 % 100 mL  Inject 1 g into the vein every 12 (twelve) hours.     omeprazole 20 MG capsule  Commonly known as:  PRILOSEC  Take 20 mg by mouth daily.     polyethylene glycol packet  Commonly known as:  MIRALAX  Take 17 g by mouth daily as needed.       Allergies  Allergen Reactions  . Iodine Rash and Other (See Comments)    REACTION:If injected,  Rash/irritated skin reaction "welts"  . Penicillins Hives  . Sulfa Antibiotics Rash      The results of significant diagnostics from this hospitalization (including imaging, microbiology, ancillary and laboratory) are listed below  for reference.    Significant Diagnostic Studies: Ct Abdomen Pelvis Wo Contrast  09/06/2013   CLINICAL DATA:  Diverticulitis with a micro perforation.  EXAM: CT ABDOMEN AND PELVIS WITHOUT CONTRAST  TECHNIQUE: Multidetector CT imaging of the abdomen and pelvis was performed following the standard protocol without IV contrast.  COMPARISON:  CT scan dated 08/24/2013  FINDINGS: There is a 4.0 x 3.8 x 3.4 cm abscess in the midline of the pelvis posterior to the bladder and immediately adjacent to the right ovary. There is air and fluid in this abscess and it communicates with the sigmoid portion of the colon at the site of the previously demonstrated micro perforation.  Pericolonic soft tissue stranding has resolved. There are multiple diverticula in the sigmoid portion of the colon.  There are no dilated loops of large or small bowel. Terminal ileum and the rest of the small bowel are normal.  The patient has a large hiatal hernia. There are multiple benign cysts in the liver, unchanged. Gallbladder has been removed. There is diffuse pancreatic atrophy, stable. There is chronic perinephric  soft tissue stranding which is stable.  The patient has a severe lumbar scoliosis.  There is no free air or free fluid. The uterus has been removed. Left ovary is normal. No acute osseous abnormalities.  IMPRESSION: 1. The patient has a 4 cm abscess in the midline of the pelvis posterior to the bladder at the site of previous micro perforation. The abscess contains fluid and air and appears to communicate directly with the sigmoid portion of the colon. 2. Resolution of the pericolonic soft tissue stranding at the site of previous micro perforation. New slight presacral soft tissue edema. Critical Value/emergent results were called by telephone at the time of interpretation on 09/06/2013 at 3:58 PM to [facility], [place] , who verbally acknowledged these results.   Electronically Signed   By: Rozetta Nunnery M.D.   On: 09/06/2013 16:01   Ct Abdomen Pelvis Wo Contrast  08/24/2013   CLINICAL DATA:  Pelvic pain.  EXAM: CT ABDOMEN AND PELVIS WITHOUT CONTRAST  TECHNIQUE: Multidetector CT imaging of the abdomen and pelvis was performed following the standard protocol without IV contrast.  COMPARISON:  CT ABD - PELV W/ CM dated 12/26/2012  FINDINGS: Large hiatal hernia.  Perinephric stranding is symmetrical. No hydronephrosis. No urinary calculus.  There is slight wall thickening of the sigmoid colon and stranding in the adjacent fat. A small bubble of extraluminal bowel gas projects just inferior to the sigmoid colon compatible with micro perforation. Discrepancy from a diverticulum as best appreciated on coronal reformat images. No abscess.  Bladder is unremarkable.  Uterus is absent.  Stable hypodensities in the liver. Spleen is within normal limits. Pancreas is atrophic. Stable adrenal glands.  Severe scoliosis.  Stable T12 compression deformity.  IMPRESSION: Acute diverticulitis of the sigmoid colon with a small amount of extraluminal bowel gas compatible with microperforation. No abscess. Critical Value/emergent results were  called by telephone at the time of interpretation on 08/24/2013 at 6:45 PM to Dr. Ezequiel Essex , who verbally acknowledged these results.   Electronically Signed   By: Maryclare Bean M.D.   On: 08/24/2013 18:46   Ct Abdomen Pelvis W Contrast  09/13/2013   CLINICAL DATA:  Diverticulitis, pelvic abscess and status post percutaneous catheter drainage procedure on 09/09/2013.  EXAM: CT ABDOMEN AND PELVIS WITH CONTRAST  TECHNIQUE: Multidetector CT imaging of the abdomen and pelvis was performed using the standard protocol following bolus administration of intravenous  contrast.  CONTRAST:  167mL OMNIPAQUE IOHEXOL 300 MG/ML  SOLN  COMPARISON:  09/09/2013, 09/06/2013 and 08/24/2013.  FINDINGS: Right pelvic drainage catheter is in stable position. There is residual abscess fluid just superior to the pigtail catheter with the catheter lying at the base of the collection. Residual abscess containing air-fluid level measures approximately 3.1 x 3.7 cm.  No new collections are identified. There is no evidence of bowel obstruction or free intraperitoneal air. Small right basilar pleural effusion and trace left pleural effusion present. The liver, spleen, pancreas, adrenal glands and kidneys are stable. No masses, hernias or enlarged lymph nodes are seen. Bony structures showed stable advanced degenerative disease of the spine with associated rightward convex scoliosis.  IMPRESSION: Residual right-sided pelvic abscess lying just superior to an indwelling pigtail catheter. Maximum diameter of residual abscess collection is 3.7 cm. No new abscess collections are identified.   Electronically Signed   By: Aletta Edouard M.D.   On: 09/13/2013 07:40   Dg Chest Port 1 View  09/16/2013   CLINICAL DATA:  Right PICC placement  EXAM: PORTABLE CHEST - 1 VIEW  COMPARISON:  Prior chest x-ray 09/06/2013  FINDINGS: New right upper extremity PICC. The catheter tip is folded upon itself in the region of the right brachiocephalic vein. The tip  of the catheter may be resting against the superior wall of the subclavian vein, or in the origin of a small branch like the external jugular vein.  Stable position of left subclavian approach biventricular cardiac rhythm maintenance device. Stable cardiomegaly and aortic atherosclerosis. Pulmonary vascular congestion without overt edema is slightly increased compared to prior. Small bilateral pleural effusions with associated atelectasis.  IMPRESSION: 1. The tip of the right upper extremity PICC is folded upon itself in the region of the right brachiocephalic vein. The tip of the catheter is either against the roof of the subclavian vein, or in the origin of a small the vessel such as the external jugular vein. This catheter could be manipulated under fluoroscopy to reposition the tip into the SVC. 2. Small bilateral layering pleural effusions with associated bibasilar atelectasis. 3. Slightly increased pulmonary vascular congestion without overt edema. These results were called by telephone at the time of interpretation on 09/16/2013 at 9:40 AM to the patient's nurse, Sunday Spillers, who verbally acknowledged these results.   Electronically Signed   By: Jacqulynn Cadet M.D.   On: 09/16/2013 09:45   Dg Chest Port 1 View  09/06/2013   CLINICAL DATA:  Shortness of breath.  EXAM: PORTABLE CHEST - 1 VIEW  COMPARISON:  Two-view chest 10/11/2012.  FINDINGS: The heart is enlarged. Emphysematous changes are again noted. Mild bilateral edema superimposed. No focal airspace disease is present. Pacing and defibrillator wires are stable. Calcifications and scarring at the lung apices are stable.  IMPRESSION: Mild bilateral interstitial edema is superimposed on chronic emphysematous changes.   Electronically Signed   By: Lawrence Santiago M.D.   On: 09/06/2013 13:14   Ct Image Guided Drainage By Percutaneous Catheter  09/09/2013   CLINICAL DATA:  Progressive Diverticular abscess  EXAM: CT GUIDED DRAINAGE OF PELVIC ABSCESS   ANESTHESIA/SEDATION: Intravenous Fentanyl and Versed were administered as conscious sedation during continuous cardiorespiratory monitoring by the radiology RN, with a total moderate sedation time of LESS THAN 30 minutes.  PROCEDURE: The procedure, risks, benefits, and alternatives were explained to the patient. Questions regarding the procedure were encouraged and answered. The patient understands and consents to the procedure.  Select axial scans through the  lower pelvis were obtained. The abscess cavity posterior to the urinary bladder which had been seen on previous diagnostic CT was again localized. This now contains some oral contrast material, and an measures 4.2 cm maximum transverse diameter. Additionally, there is a progressive adjacent abscess to the right of the previous collection which has significantly progressed since the previous scan, measuring up to 5 cm maximum transverse diameter. This collection also contains some oral contrast material and gas which had not been seen on the previous study. This had a more favorable percutaneous approach and also represented the largest collection, so this region was targeted for drain catheter placement.  The overlying skin was prepped with Betadinein a sterile fashion, and a sterile drape was applied covering the operative field. A sterile gown and sterile gloves were used for the procedure. Local anesthesia was provided with 1% Lidocaine.  Under CT fluoroscopic guidance, an 18 gauge trocar needle was advanced into the collection. Purulent material could be aspirated. An Amplatz guidewire advanced easily within the collection, its position confirmed on CT fluoroscopy. Tract was dilated to facilitate placement of a 12 French pigtail catheter, formed centrally within the collection, the position confirmed on CT. Catheter secured externally with 0 Prolene suture and StatLock and placed to gravity drain bag. A sample of the aspirate was sent for routine Gram  stain and culture. The patient tolerated the procedure well.  COMPLICATIONS: No immediate complication.  IMPRESSION: 1. Technically successful CT-guided pelvic abscess drain catheter placement.   Electronically Signed   By: Arne Cleveland M.D.   On: 09/09/2013 15:54    Microbiology: Recent Results (from the past 240 hour(s))  CULTURE, ROUTINE-ABSCESS     Status: None   Collection Time    09/09/13  1:01 PM      Result Value Ref Range Status   Specimen Description DRAINAGE   Final   Special Requests Normal   Final   Gram Stain     Final   Value: ABUNDANT WBC PRESENT,BOTH PMN AND MONONUCLEAR     NO SQUAMOUS EPITHELIAL CELLS SEEN     MODERATE GRAM POSITIVE COCCI IN PAIRS     MODERATE GRAM POSITIVE RODS     Performed at [facility]   Culture     Final   Value: MODERATE CANDIDA ALBICANS     Performed at [facility]   Report Status 09/11/2013 FINAL   Final     Labs: Basic Metabolic Panel:  Recent Labs Lab 09/10/13 0540 09/12/13 0537 09/13/13 0442 09/14/13 0412  NA 139 137 138 139  K 4.0 3.7 3.8 3.6*  CL 104 103 104 105  CO2 20 18* 18* 20  GLUCOSE 86 91 172* 152*  BUN 10 12 11 13   CREATININE 0.93 0.99 0.83 0.82  CALCIUM 7.9* 8.0* 8.2* 8.4   Liver Function Tests:  Recent Labs Lab 09/13/13 0442  AST 31  ALT 15  ALKPHOS 58  BILITOT <0.2*  PROT 5.7*  ALBUMIN 2.0*   No results found for this basename: LIPASE, AMYLASE,  in the last 168 hours No results found for this basename: AMMONIA,  in the last 168 hours CBC:  Recent Labs Lab 09/12/13 1058 09/13/13 0442 09/14/13 0412 09/15/13 0556 09/16/13 0432  WBC 10.3 7.6 13.1* 12.7* 11.3*  NEUTROABS  --   --   --  11.3*  --   HGB 9.3* 9.3* 9.0* 9.1* 9.2*  HCT 28.2* 27.7* 27.1* 27.4* 27.7*  MCV 91.0 88.8 88.9 88.4 91.4  PLT  308 328 331 318 288   Cardiac Enzymes: No results found for this basename: CKTOTAL, CKMB, CKMBINDEX, TROPONINI,  in the last 168 hours BNP: BNP (last 3 results) No results  found for this basename: PROBNP,  in the last 8760 hours CBG: No results found for this basename: GLUCAP,  in the last 168 hours     Signed:  Nita Sells  [facility] 09/16/2013, 11:50 AM

## 2013-09-21 ENCOUNTER — Telehealth (INDEPENDENT_AMBULATORY_CARE_PROVIDER_SITE_OTHER): Payer: Self-pay

## 2013-09-21 ENCOUNTER — Other Ambulatory Visit (INDEPENDENT_AMBULATORY_CARE_PROVIDER_SITE_OTHER): Payer: Self-pay

## 2013-09-21 DIAGNOSIS — K5732 Diverticulitis of large intestine without perforation or abscess without bleeding: Secondary | ICD-10-CM

## 2013-09-21 NOTE — Telephone Encounter (Signed)
V/m to call for appointment and CT Instructions : Appointment with Dr. Lucia Gaskins 09/29/13 @ 10am She will need a CT Abd/pelvis with contrast 09/27/13 @ 12n with arrival @ 11:45 at [facility] , She will need  to Pick up CONTRAST before the 26th for the CT on May 28th , On the 28th she CAN  eat before 8am, NPO after 8am, She needs to start drinking her contrast at 10am. 1st bottle at 10 am then the 2nd bottle at 11 am , (she can finish  Drinking  the 2nd bottle on the way to [facility])

## 2013-09-22 NOTE — Telephone Encounter (Signed)
3rd attempted to give patient CT instructions and appointment with DR. Newman for next week

## 2013-09-27 ENCOUNTER — Ambulatory Visit (HOSPITAL_COMMUNITY): Admission: RE | Admit: 2013-09-27 | Payer: Medicare Other | Source: Ambulatory Visit

## 2013-09-29 ENCOUNTER — Other Ambulatory Visit (INDEPENDENT_AMBULATORY_CARE_PROVIDER_SITE_OTHER): Payer: Self-pay

## 2013-09-29 ENCOUNTER — Ambulatory Visit (INDEPENDENT_AMBULATORY_CARE_PROVIDER_SITE_OTHER): Payer: Medicare Other | Admitting: Surgery

## 2013-09-29 ENCOUNTER — Encounter (INDEPENDENT_AMBULATORY_CARE_PROVIDER_SITE_OTHER): Payer: Self-pay | Admitting: Surgery

## 2013-09-29 ENCOUNTER — Telehealth (INDEPENDENT_AMBULATORY_CARE_PROVIDER_SITE_OTHER): Payer: Self-pay

## 2013-09-29 VITALS — BP 124/76 | HR 60 | Temp 98.0°F | Resp 18 | Ht 67.0 in | Wt 137.0 lb

## 2013-09-29 DIAGNOSIS — K5732 Diverticulitis of large intestine without perforation or abscess without bleeding: Secondary | ICD-10-CM

## 2013-09-29 DIAGNOSIS — K5792 Diverticulitis of intestine, part unspecified, without perforation or abscess without bleeding: Secondary | ICD-10-CM

## 2013-09-29 NOTE — Telephone Encounter (Signed)
Ct abdomen/pelvis w/contrast scduled @ [facility] @ [id number] Patient to start drinking contrast @ 6:30a and 730a Fistula gram @ 930 may pull drain if ok noted F/U appt with Dr. Lucia Gaskins 11/10/13 V/M to [facility] @ [phone] with info

## 2013-09-29 NOTE — Progress Notes (Addendum)
Naomi, MD,  [address] [address]  [place], [place]    [place] Phone:  [phone] FAX:  [phone]   Re:   Kimberly [name] DOB:   [birth year] MRN:   [id number]  ASSESSMENT AND PLAN: 1.  Recurrent diverticular abscess  Perc drain on 09/09/2013  Now at [facility] in [place].  She is on antibiotics that she is to stop 5/30.  Ms. Hinkelman is a older frail women, who would probably be a poor candidate for surgery.  I gave her literature on diverticular disease.  Plan:  1) To CT scan and fistula study of drain, 2) If negative, pull drain and remove PICC, 3) see me back in 6 weeks. I wrote a handwritten progress note to go with her to [facility].  [The drain was pulled by accident.  CT scan 10/10/2013 shows 1. Persistent pelvic fluid collections adjacent to the sigmoid colon and urinary bladder concerning for residual abscess. 2. Sigmoid diverticula without focal inflammation around the diverticula.  Glenda to check on patient.  If patient doing okay, I will see as scheduled.  DN 10/13/2013]  2.  On Eliquis 3.  A. Fib 4.  Pacemaker 5.  Large hiatal hernia 6.  Severe lumbar scoliosis 7.  Minimal mobility -   I asked her how she got around in her house and she goes from furniture to furniture. 8.  PICC line - right arm - for antibiotics. 9.  Anemia  Last Hgb - 9.0 - 09/14/2013  HISTORY OF PRESENT ILLNESS: Chief Complaint  Patient presents with  . Routine Post Op    diverticlular    Kimberly [name] is a 80 y.o. (DOB: 08-15-1926) white  female who is a patient of WONG,FRANCIS PATRICK, MD and comes to me today for follow up of a diverticular abscess. She is at the [facility] in [place].  Kimberly [name] from [facility] is with her.  She is doing well since discharge. She is eating and her bowels are doing fairly well.  She has minimal drainage from the perc drain - but there are no measurements with her. She thinks her last colonoscopy was  at [facility] - unknown date and physician.  History of diverticular disease: She had a hospitalization for acute diverticulitis with microperf (08/24/13 - 08/28/13). She was treated with Cipro/flagyl.  Then she re-hospitalized from 09/05/2013 (originally at [facility]), then transferred to [facility] on 09/09/2013 till 09/16/2013.  She had a perc drain on 09/09/2013 by Dr. Vernard Gambles. I saw her one time at the end of my [facility] on 09/09/2013.  It looks like she was discharged with a PICC line on antibiotics.  Discharged on Meropenum and Diflucan (IV).  CT scan abdomen - 09/13/2013 - Residual right-sided pelvic abscess lying just superior to an  indwelling pigtail catheter. Maximum diameter of residual abscess collection is 3.7 cm. No new abscess collections are identified.  Past Medical History  Diagnosis Date  . Depression   . Atrial fibrillation   . Scoliosis   . Osteoarthritis   . Pacemaker     Last saw cards 07/2013  . Fibromyalgia   . Hypertension   . Glaucoma   . Acute diverticulitis 08/24/2013  . CKD (chronic kidney disease) stage 3, GFR 30-59 ml/min 10/12/2012  . Chronic anticoagulation 10/12/2012  . Acute blood loss anemia 10/11/2012  . Antral ulcer 10/11/2012  . Erosive esophagitis 10/11/2012   Current Outpatient Prescriptions  Medication Sig  Dispense Refill  . apixaban (ELIQUIS) 2.5 MG TABS tablet Take 2.5 mg by mouth 2 (two) times daily.      . carvedilol (COREG) 6.25 MG tablet Take 1 tablet (6.25 mg total) by mouth 2 (two) times daily with a meal.  180 tablet  1  . Doxepin HCl 3 MG TABS Take 1 tablet by mouth at bedtime.      . furosemide (LASIX) 20 MG tablet Take 2 tablets by mouth  daily  30 tablet  0  . HYDROcodone-acetaminophen (NORCO/VICODIN) 5-325 MG per tablet Take 1-2 tablets by mouth every 6 (six) hours as needed for moderate pain.  30 tablet  0  . meropenem 1 g in sodium chloride 0.9 % 100 mL Inject 1 g into the vein every 12 (twelve) hours.  1 g  0  . omeprazole (PRILOSEC) 20 MG  capsule Take 20 mg by mouth daily.      . polyethylene glycol (MIRALAX) packet Take 17 g by mouth daily as needed.  14 each  0  . fluconazole (DIFLUCAN) 400-0.9 MG/200ML-% IVPB Inject 200 mLs (400 mg total) into the vein daily.  200 mL  0   No current facility-administered medications for this visit.   PAST MED HISTORY: GI:  Prior appendectomy about 1965, prior cholecystectomy about 1970, and two hernias repaired in lower abdomen.  SOCIAL HISTORY: Widowed. Lived by self before hospitalization.  Now at [facility] for rehab - unsure of time frame. Has two children: Son in [place], and daughter in [place], [place].  She is not close to her children.  PHYSICAL EXAM: BP 124/76  Pulse 60  Temp(Src) 98 F (36.7 C)  Resp 18  Ht 5\' 7"  (1.702 m)  Wt 137 lb (62.143 kg)  BMI 21.45 kg/m2  General: Older frail WF who is alert.  HEENT: Normal. Pupils equal.  Neck: Supple. No mass.  No thyroid mass.   Lymph Nodes:  No supraclavicular or cervical nodes. Lungs: Clear to auscultation and symmetric breath sounds. Heart:  RRR. No murmur or rub.  Pacer left chest. Abdomen: Soft. No mass. No tenderness. No hernia. Normal bowel sounds.  Upper midline scar and lower midline scar.  Drain from RLQ.  DATA REVIEWED: Epic notes  Alphonsa Overall, MD,  [facility], [address]  [place], [place]    [place] Phone:  [phone] FAX:  [phone]

## 2013-09-30 NOTE — Telephone Encounter (Signed)
Betty care giver for pt called and was given date and time of CT. Given instructions in epic left  by [facility]. Inez Catalina states understanding.

## 2013-10-03 ENCOUNTER — Encounter: Payer: Self-pay | Admitting: Cardiovascular Disease

## 2013-10-10 ENCOUNTER — Ambulatory Visit (HOSPITAL_COMMUNITY)
Admission: RE | Admit: 2013-10-10 | Discharge: 2013-10-10 | Disposition: A | Discharge status: Home / Self Care | Payer: Medicare Other | Source: Ambulatory Visit | Attending: Surgery | Admitting: Surgery

## 2013-10-10 ENCOUNTER — Encounter (HOSPITAL_COMMUNITY): Payer: Self-pay

## 2013-10-10 ENCOUNTER — Telehealth (INDEPENDENT_AMBULATORY_CARE_PROVIDER_SITE_OTHER): Payer: Self-pay

## 2013-10-10 ENCOUNTER — Other Ambulatory Visit (INDEPENDENT_AMBULATORY_CARE_PROVIDER_SITE_OTHER): Payer: Self-pay | Admitting: Surgery

## 2013-10-10 ENCOUNTER — Ambulatory Visit (HOSPITAL_COMMUNITY): Admission: RE | Admit: 2013-10-10 | Payer: Medicare Other | Source: Ambulatory Visit

## 2013-10-10 DIAGNOSIS — K5732 Diverticulitis of large intestine without perforation or abscess without bleeding: Secondary | ICD-10-CM

## 2013-10-10 DIAGNOSIS — I700 Atherosclerosis of aorta: Secondary | ICD-10-CM | POA: Insufficient documentation

## 2013-10-10 DIAGNOSIS — K449 Diaphragmatic hernia without obstruction or gangrene: Secondary | ICD-10-CM | POA: Insufficient documentation

## 2013-10-10 DIAGNOSIS — M4120 Other idiopathic scoliosis, site unspecified: Secondary | ICD-10-CM | POA: Insufficient documentation

## 2013-10-10 DIAGNOSIS — K630 Abscess of intestine: Secondary | ICD-10-CM | POA: Insufficient documentation

## 2013-10-10 DIAGNOSIS — K7689 Other specified diseases of liver: Secondary | ICD-10-CM | POA: Insufficient documentation

## 2013-10-10 DIAGNOSIS — I517 Cardiomegaly: Secondary | ICD-10-CM | POA: Insufficient documentation

## 2013-10-10 MED ORDER — IOHEXOL 300 MG/ML  SOLN: 100.0000 mL | Freq: Once | INTRAMUSCULAR | Status: DC | PRN

## 2013-10-10 NOTE — Telephone Encounter (Addendum)
Aaron Edelman CTr/ [name] RN states patient pulled out drainage tube over the weekend , Ct was not done. I informed her I would pass this to DR. Newman and call her with further orders @ [phone].

## 2013-11-09 ENCOUNTER — Encounter: Payer: Self-pay | Admitting: *Deleted

## 2013-11-10 ENCOUNTER — Encounter (INDEPENDENT_AMBULATORY_CARE_PROVIDER_SITE_OTHER): Payer: Medicare Other | Admitting: Surgery

## 2013-11-15 ENCOUNTER — Encounter: Payer: Self-pay | Admitting: Cardiovascular Disease

## 2013-11-15 ENCOUNTER — Ambulatory Visit (INDEPENDENT_AMBULATORY_CARE_PROVIDER_SITE_OTHER): Payer: Medicare Other | Admitting: Cardiovascular Disease

## 2013-11-15 VITALS — BP 118/64 | HR 80 | Resp 22 | Ht 67.0 in | Wt 137.6 lb

## 2013-11-15 DIAGNOSIS — I482 Chronic atrial fibrillation, unspecified: Secondary | ICD-10-CM

## 2013-11-15 DIAGNOSIS — Z950 Presence of cardiac pacemaker: Secondary | ICD-10-CM

## 2013-11-15 DIAGNOSIS — Z79899 Other long term (current) drug therapy: Secondary | ICD-10-CM

## 2013-11-15 DIAGNOSIS — I4891 Unspecified atrial fibrillation: Secondary | ICD-10-CM

## 2013-11-15 DIAGNOSIS — I442 Atrioventricular block, complete: Secondary | ICD-10-CM

## 2013-11-15 DIAGNOSIS — I509 Heart failure, unspecified: Secondary | ICD-10-CM

## 2013-11-15 DIAGNOSIS — I5032 Chronic diastolic (congestive) heart failure: Secondary | ICD-10-CM | POA: Insufficient documentation

## 2013-11-15 DIAGNOSIS — I5043 Acute on chronic combined systolic (congestive) and diastolic (congestive) heart failure: Secondary | ICD-10-CM

## 2013-11-15 HISTORY — DX: Acute on chronic combined systolic (congestive) and diastolic (congestive) heart failure: I50.43

## 2013-11-15 LAB — MDC_IDC_ENUM_SESS_TYPE_INCLINIC
Battery Remaining Longevity: 57 mo
Battery Voltage: 3 V
Brady Statistic AP VP Percent: 0 %
Brady Statistic AP VS Percent: 0 %
Brady Statistic AS VP Percent: 98.26 %
Brady Statistic RV Percent Paced: 98.26 %
Date Time Interrogation Session: 20150714154428
Lead Channel Impedance Value: 4047 Ohm
Lead Channel Impedance Value: 4047 Ohm
Lead Channel Impedance Value: 494 Ohm
Lead Channel Impedance Value: 551 Ohm
Lead Channel Impedance Value: 703 Ohm
Lead Channel Impedance Value: 912 Ohm
Lead Channel Pacing Threshold Amplitude: 0.625 V
Lead Channel Pacing Threshold Amplitude: 0.875 V
Lead Channel Pacing Threshold Pulse Width: 1 ms
Lead Channel Sensing Intrinsic Amplitude: 9.75 mV
Lead Channel Setting Pacing Amplitude: 2 V
Lead Channel Setting Pacing Amplitude: 2 V
Lead Channel Setting Pacing Pulse Width: 0.4 ms
Lead Channel Setting Sensing Sensitivity: 2.8 mV
MDC IDC MSMT LEADCHNL LV IMPEDANCE VALUE: 665 Ohm
MDC IDC MSMT LEADCHNL RV IMPEDANCE VALUE: 665 Ohm
MDC IDC MSMT LEADCHNL RV IMPEDANCE VALUE: 703 Ohm
MDC IDC MSMT LEADCHNL RV PACING THRESHOLD PULSEWIDTH: 0.4 ms
MDC IDC MSMT LEADCHNL RV SENSING INTR AMPL: 9.75 mV
MDC IDC SET LEADCHNL LV PACING PULSEWIDTH: 1 ms
MDC IDC SET ZONE DETECTION INTERVAL: 400 ms
MDC IDC STAT BRADY AS VS PERCENT: 1.74 %
MDC IDC STAT BRADY RA PERCENT PACED: 0 %
Zone Setting Detection Interval: 150 ms

## 2013-11-15 MED ORDER — FUROSEMIDE 20 MG PO TABS: 60.0000 mg | ORAL_TABLET | Freq: Every day | ORAL | Status: DC

## 2013-11-15 NOTE — Assessment & Plan Note (Signed)
She is pacemaker dependent

## 2013-11-15 NOTE — Assessment & Plan Note (Signed)
Normal device function. No programming changes were made. Thoracic impedance (Optivol) consistent with hypervolemia. Diuretic dose increased today. Continue remote down those for pacemaker function every 3 months, but will also ask her to do an additional dye load in 2 weeks to see if the thoracic impedance is returning to baseline with enhanced diuretic therapy.

## 2013-11-15 NOTE — Progress Notes (Signed)
Patient ID: KRYSTIANNA GLIDDEN, female   DOB: [birth year], 80 y.o.   MRN: [id number]      Reason for office visit Pacemaker followup, congestive heart failure  Mrs. Obara has had a rather rough year. She had a pelvic abscess related to acute diverticulitis. She had a percutaneous drain placed that was inadvertently removed but has not require surgical reexploration. During her prolonged hospitalization she had evidence of congestive heart failure, confirmed by marked deviation in her Optivol index. This occurred throughout most of April, May and June but towards the end of June her thoracic impedance returned to normal levels. In mid-June it again began to gradually decrease and this has been associated with worsening shortness of breath and swelling in her ankles. She becomes dyspneic simply getting dressed. She does not have orthopnea. Her ankle edema has been only mild. She has lost substantial weight during her abdominal surgical illness. She now weighs 11 pounds less than she did in April. Her ideal "dry weight" is unclear at this point. Her device has not recorded any meaningful episodes of atrial or ventricular tachyarrhythmia. She has 98.3% biventricular pacing.  Mrs. Helm has a long-standing history of complete heart block and nonischemic cardiomyopathy with an excellent response to cardiac resynchronization therapy. She originally had a CRT-D device but at the time of generator change out was "downgraded" to a CRT-P. since her left ventricular ejection fraction increased to 50-55%. Her current device is a Medtronic consulta C4 TR 01 implanted in June 2012. Her defibrillator lead, which was a Sprint fidelis lead is capped. He has had paroxysmal atrial fibrillation in the past and is on chronic anticoagulation therapy. Her most recent echocardiogram was performed in 2012 and showed left ventricular ejection fraction of 50-55%, biatrial dilatation, moderate mitral regurgitation, moderate tricuspid  regurgitation. She has long-standing problems with chronic scoliosis and fibromyalgia.    Allergies  Allergen Reactions  . Iodine Rash and Other (See Comments)    REACTION:If injected,  Rash/irritated skin reaction "welts"  . Penicillins Hives  . Sulfa Antibiotics Rash    Current Outpatient Prescriptions  Medication Sig Dispense Refill  . apixaban (ELIQUIS) 2.5 MG TABS tablet Take 2.5 mg by mouth 2 (two) times daily.      . carvedilol (COREG) 6.25 MG tablet Take 1 tablet (6.25 mg total) by mouth 2 (two) times daily with a meal.  180 tablet  1  . furosemide (LASIX) 20 MG tablet Take 3 tablets (60 mg total) by mouth daily.  270 tablet  3  . HYDROcodone-acetaminophen (NORCO/VICODIN) 5-325 MG per tablet Take 1-2 tablets by mouth every 6 (six) hours as needed for moderate pain.  30 tablet  0  . omeprazole (PRILOSEC) 20 MG capsule Take 20 mg by mouth daily.       No current facility-administered medications for this visit.    Past Medical History  Diagnosis Date  . Depression   . Atrial fibrillation   . Scoliosis   . Osteoarthritis   . Pacemaker     Last saw cards 07/2013  . Fibromyalgia   . Hypertension   . Glaucoma   . Acute diverticulitis 08/24/2013  . CKD (chronic kidney disease) stage 3, GFR 30-59 ml/min 10/12/2012  . Chronic anticoagulation 10/12/2012  . Acute blood loss anemia 10/11/2012  . Antral ulcer 10/11/2012  . Erosive esophagitis 10/11/2012  . Cardiomyopathy, nonischemic     Past Surgical History  Procedure Laterality Date  . Pacemaker insertion    . Abdominal hysterectomy    .  Cholecystectomy    . Appendectomy    . Breast surgery    . Back surgery    . Neck surgery    . Hernia repair      right inguinal hernia and umbilical  . Tonsillectomy    . Esophagogastroduodenoscopy N/A 10/13/2012    Procedure: ESOPHAGOGASTRODUODENOSCOPY (EGD);  Surgeon: Daneil Dolin, MD;  Location: [facility] SUITE;  Service: Endoscopy;  Laterality: N/A;  . Cystoscopy N/A 02/24/2013     Procedure: CYSTOSCOPY WITH URETHRAL DILITATION;  Surgeon: Marissa Nestle, MD;  Location: [facility] ORS;  Service: Urology;  Laterality: N/A;  . Doppler echocardiography N/A 05/30/2010    LV SIZE IS NORMAL. LV SYSTOLIC FUNCTION IS LOW NORMAL. EF=50-55%. MILD INFERIOR HYPOKINESIS.MILD TO MODERATE POSTERIOR WALL HYPOKINESIS.PACEMAKER LEAD IN THE RV. LA IS MILDLY DILATED. RA IS MODERATE TO SEVERLY DILATED. PACEMAKER LEAD IN THE RA. MILD CALCICICATION OF THE MV APPARATUS. MODERATE MR. MILD TO MODERATE TR. MILD PHTN.AV MILDLY SCLEROTIC.  . Nuclear stress test N/A 02/13/2009    NORMAL PATTERN OF PERFUSION IN ALL REGIONS. POST STRESS VENTICULAR SIZE IS NORMAL. POST  STESS EF 85%.  NORMAL MYOCARDIAL PERFUSION STUDY.  . Cardiac catheterization  12/08/2005    LAD AND LEFT MAIN WITH NO HIGH-GRADE STENOSIS. MILD DISEASE IN THE CX AND LAD SYSTEM. SEVERE LV DYSFUNCTION WITH DILATION OF THE LV. EF 15-20%. LV END-DIASTOLIC PRESSURE IS 90. +1 MR.  Fawn Kirk ext venous Bilateral 11-08-10    R & L- NO EVIDENCE OF THROMBUS OR THROMBOPHLEBITIS. THERE IS MILD AMOUNT OF SUBCUTANEOUS EDEMA NOTED WITHIN THE LEFT CALF AND ANKLE. R & L GSV AND SSV- NO VENOUS INSUFF NOTED.    Family History  Problem Relation Age of Onset  . Cancer Sister   . Asthma Sister   . Heart failure Brother   . Colon cancer Neg Hx     History   Social History  . Marital Status: Widowed    Spouse Name: N/A    Number of Children: N/A  . Years of Education: N/A   Occupational History  . telephone operator     retired   Social History Main Topics  . Smoking status: Former Smoker -- 0.50 packs/day for 30 years    Types: Cigarettes    Quit date: 12/13/2004  . Smokeless tobacco: Not on file  . Alcohol Use: 0.6 oz/week    1 Glasses of wine per week     Comment: several glasses of wine each evening  . Drug Use: No  . Sexual Activity: No   Other Topics Concern  . Not on file   Social History Narrative  . No narrative on file    Review of  systems: Shortness of breath with minimal exertion. No chest pain, dizziness, syncope or focal neurological deficits. Mild ankle swelling. She has noted that her urine output has decreased.  The patient specifically denies any chest pain at rest or with exertion, orthopnea, paroxysmal nocturnal dyspnea, syncope, palpitations, focal neurological deficits, intermittent claudication, unexplained weight gain (she has not been weighing herself regularly), cough, hemoptysis or wheezing.  The patient also denies abdominal pain, nausea, vomiting, dysphagia, diarrhea, constipation, polyuria, polydipsia, dysuria, hematuria, frequency, urgency, abnormal bleeding or bruising, fever, chills, unexpected weight changes, mood swings, change in skin or hair texture, change in voice quality, auditory or visual problems, allergic reactions or rashes, new musculoskeletal complaints other than usual "aches and pains".    PHYSICAL EXAM BP 118/64  Pulse 80  Resp 22  Ht 5'  7" (1.702 m)  Wt 137 lb 9.6 oz (62.415 kg)  BMI 21.55 kg/m2  General: Alert, oriented x3, no distress, he is in a wheelchair she has prominent kyphoscoliosis Head: no evidence of trauma, PERRL, EOMI, no exophtalmos or lid lag, no myxedema, no xanthelasma; normal ears, nose and oropharynx Neck: normal jugular venous pulsations and no hepatojugular reflux; brisk carotid pulses without delay and no carotid bruits Chest: clear to auscultation, no signs of consolidation by percussion or palpation, normal fremitus, symmetrical and full respiratory excursions Cardiovascular: normal position and quality of the apical impulse, regular rhythm, normal first and paradoxically split second heart sounds, no murmurs, rubs or gallops Abdomen: no tenderness or distention, no masses by palpation, no abnormal pulsatility or arterial bruits, normal bowel sounds, no hepatosplenomegaly Extremities: no clubbing, cyanosis or edema; 2+ radial, ulnar and brachial pulses  bilaterally; 2+ right femoral, posterior tibial and dorsalis pedis pulses; 2+ left femoral, posterior tibial and dorsalis pedis pulses; no subclavian or femoral bruits Neurological: grossly nonfocal   EKG: Atrial sensed biventricular paced  Lipid Panel     Component Value Date/Time   CHOL  Value: 181        ATP III CLASSIFICATION:  <200     mg/dL   Desirable  200-239  mg/dL   Borderline High  >=240    mg/dL   High        02/06/2009 0450   TRIG 81 02/06/2009 0450   HDL 78 02/06/2009 0450   CHOLHDL 2.3 02/06/2009 0450   VLDL 16 02/06/2009 0450   LDLCALC  Value: 87        Total Cholesterol/HDL:CHD Risk Coronary Heart Disease Risk Table                     Men   Women  1/2 Average Risk   3.4   3.3  Average Risk       5.0   4.4  2 X Average Risk   9.6   7.1  3 X Average Risk  23.4   11.0        Use the calculated Patient Ratio above and the CHD Risk Table to determine the patient's CHD Risk.        ATP III CLASSIFICATION (LDL):  <100     mg/dL   Optimal  100-129  mg/dL   Near or Above                    Optimal  130-159  mg/dL   Borderline  160-189  mg/dL   High  >190     mg/dL   Very High 02/06/2009 0450    BMET    Component Value Date/Time   NA 139 09/14/2013 0412   NA 141 05/30/2013 1803   K 3.6* 09/14/2013 0412   CL 105 09/14/2013 0412   CO2 20 09/14/2013 0412   GLUCOSE 152* 09/14/2013 0412   GLUCOSE 86 05/30/2013 1803   BUN 13 09/14/2013 0412   BUN 17 05/30/2013 1803   CREATININE 0.82 09/14/2013 0412   CREATININE 1.48* 10/11/2012 1134   CALCIUM 8.4 09/14/2013 0412   GFRNONAA 64* 09/14/2013 0412   GFRNONAA 33* 10/11/2012 1134   GFRAA 74* 09/14/2013 0412   GFRAA 37* 10/11/2012 1134     ASSESSMENT AND PLAN Biventricular cardiac pacemaker - Medtronic Consulta Normal device function. No programming changes were made. Thoracic impedance (Optivol) consistent with hypervolemia. Diuretic dose increased today. Continue remote down those for pacemaker function  every 3 months, but will also ask her to do an  additional dye load in 2 weeks to see if the thoracic impedance is returning to baseline with enhanced diuretic therapy.  Acute on chronic combined systolic and diastolic CHF, NYHA class 4 By clinical exam Mrs. Fickle has only fairly mild signs of hypervolemia neck and her pacemaker confirms the decrease in thoracic impedance consistent with volume overload. Increase furosemide to 60 mg daily. Check daily weights. Recheck thoracic impedance and check labs in 2 weeks. After initiation of BiV pacing her LV ejection fraction has improved to about 50%. I am not sure that ACE inhibitors are necessarily indicated. Will wait for lab results. Her blood pressure is relatively low.  Atrial fibrillation No recent atrial fibrillation recorded by her pacemaker.  Complete heart block She is pacemaker dependent   I suspect that her problems with heart failure decompensation are related to broad volume shifts related to her protracted course with diverticulitis and pelvic abscess. However if she fails to improve promptly we should reevaluate left ventricular systolic function with echocardiography. Orders Placed This Encounter  Procedures  . Basic metabolic panel  . Brain natriuretic peptide   Meds ordered this encounter  Medications  . furosemide (LASIX) 20 MG tablet    Sig: Take 3 tablets (60 mg total) by mouth daily.    Dispense:  270 tablet    Refill:  [place] Sherrin Stahle, MD, [facility] [phone] office [phone] pager

## 2013-11-15 NOTE — Assessment & Plan Note (Signed)
By clinical exam Kimberly [name] has only fairly mild signs of hypervolemia neck and her pacemaker confirms the decrease in thoracic impedance consistent with volume overload. Increase furosemide to 60 mg daily. Check daily weights. Recheck thoracic impedance and check labs in 2 weeks. After initiation of BiV pacing her LV ejection fraction has improved to about 50%. I am not sure that ACE inhibitors are necessarily indicated. Will wait for lab results. Her blood pressure is relatively low.

## 2013-11-15 NOTE — Assessment & Plan Note (Signed)
No recent atrial fibrillation recorded by her pacemaker.

## 2013-11-15 NOTE — Patient Instructions (Addendum)
Remote monitoring is used to monitor your Pacemaker from home. This monitoring reduces the number of office visits required to check your device to one time per year. It allows [place] to monitor the functioning of your device to ensure it is working properly. You are scheduled for a device check from home on July 29,2015. You may send your transmission at any time that day. If you have a wireless device, the transmission will be sent automatically. After your physician reviews your transmission, you will receive a postcard with your next transmission date.  INCREASE FUROSEMIDE TO 60MG  DAILY.  A NEW RX HAS BEEN SENT TO YOUR PHARMACY.  Your physician recommends that you weigh, daily, at the same time every day, and in the same amount of clothing. Please record your daily weights on the handout provided and bring it to your next appointment.  Send [place] your weights.  Your physician recommends that you return for lab work in: 2 weeks.  Dr. Sallyanne Kuster recommends that you schedule a follow-up appointment in: 3 months.

## 2013-11-21 ENCOUNTER — Telehealth: Payer: Self-pay | Admitting: Cardiology

## 2013-11-21 ENCOUNTER — Encounter: Payer: Self-pay | Admitting: Cardiovascular Disease

## 2013-11-21 NOTE — Telephone Encounter (Signed)
This encounter was created in error - please disregard.

## 2013-11-21 NOTE — Telephone Encounter (Signed)
Spoke with pt [facility] Nurse about home monitoring for pt. Pt states that she doesn't have home monitoring. I ordered pt a new monitor and informed [facility] nurse to send transmission when equipment is received.

## 2013-11-23 ENCOUNTER — Ambulatory Visit: Payer: Medicare Other | Admitting: Family Medicine

## 2013-11-29 ENCOUNTER — Ambulatory Visit (INDEPENDENT_AMBULATORY_CARE_PROVIDER_SITE_OTHER): Payer: Medicare Other | Admitting: Family Medicine

## 2013-11-29 ENCOUNTER — Encounter: Payer: Self-pay | Admitting: Family Medicine

## 2013-11-29 VITALS — BP 129/69 | HR 79 | Temp 98.4°F | Ht 67.0 in | Wt 130.0 lb

## 2013-11-29 DIAGNOSIS — R300 Dysuria: Secondary | ICD-10-CM

## 2013-11-29 DIAGNOSIS — Z0000 Encounter for general adult medical examination without abnormal findings: Secondary | ICD-10-CM

## 2013-11-29 DIAGNOSIS — N184 Chronic kidney disease, stage 4 (severe): Secondary | ICD-10-CM

## 2013-11-29 LAB — POCT URINALYSIS DIPSTICK
Bilirubin, UA: NEGATIVE
Glucose, UA: NEGATIVE
Ketones, UA: NEGATIVE
NITRITE UA: POSITIVE
Spec Grav, UA: 1.01
UROBILINOGEN UA: NEGATIVE
pH, UA: 6.5

## 2013-11-29 LAB — POCT UA - MICROSCOPIC ONLY
CASTS, UR, LPF, POC: NEGATIVE
Crystals, Ur, HPF, POC: NEGATIVE
YEAST UA: NEGATIVE

## 2013-11-29 MED ORDER — NITROFURANTOIN MONOHYD MACRO 100 MG PO CAPS: 100.0000 mg | ORAL_CAPSULE | Freq: Two times a day (BID) | ORAL | Status: DC

## 2013-11-29 NOTE — Progress Notes (Signed)
   Subjective:    Patient ID: Kimberly [name], female    DOB: November 07, 1926, 80 y.o.   MRN: [id number]  HPI 80 year old female here as followup hospital visit for abscess in the groin related to diverticular disease. She has other chronic medical problems including systolic and diastolic heart failure, hypertension, atrial fibrillation, chronic kidney disease, difficulty walking, long-term use of anticoagulants, and osteoarthritis. Her chief complaint today is dysuria, thinking she needs a pelvic exam there is no back pain or true frequency just dysuria There's also a form with her today from her insurance company related to early detection of on going violation of chronic diseases. She has a history of smoking but there is no suggestion of COPD or emphysema. There is no chronic cough or shortness of breath. Cognitively, she is intact she is able to give a good and lucid history but I did not use a screening tool on her as it did not seem indicated. There is no history of depressive disorders. And depression screen was administered. With her history of smoking and age over 89 she is it is at risk for peripheral arterial disease. She does have a history of this disease on her left leg, but not right. She is not very active and she has some issues with Ambulation and indeed she is in a wheelchair today. She does get around her living place by holding onto furniture she has not had any recent falls. Regarding secondary hyperparathyroidism with renal disease we are measuring her serum calcium and alkaline phosphatase today   Review of Systems  Constitutional: Negative.   HENT: Negative.   Respiratory: Negative.   Cardiovascular: Negative.   Endocrine: Negative.   Genitourinary: Positive for dysuria.  Musculoskeletal: Negative.   Neurological: Negative.        Objective:   Physical Exam  Constitutional: She is oriented to person, place, and time. She appears well-developed and well-nourished.  Eyes:  Conjunctivae and EOM are normal.  Neck: Normal range of motion. Neck supple.  Cardiovascular: Normal rate, regular rhythm and normal heart sounds.   L distal pulses non palpable  Pulmonary/Chest: Effort normal and breath sounds normal.  Abdominal: Soft. Bowel sounds are normal.  Musculoskeletal: Normal range of motion.  Neurological: She is alert and oriented to person, place, and time. She has normal reflexes.  Skin: Skin is warm and dry.  Psychiatric: She has a normal mood and affect. Her behavior is normal. Thought content normal.          Assessment & Plan:  1. Health care maintenance Will assess CKD - POCT urinalysis dipstick - CMP14+EGFR  2. Dysuria Probable UTI; culture urine; begin Macrobid - POCT urinalysis dipstick - POCT UA - Microscopic Only - Urine culture  3. Chronic kidney disease, stage IV (severe) As above - CMP14+EGFR  Wardell Honour MD

## 2013-11-30 ENCOUNTER — Telehealth: Payer: Self-pay | Admitting: *Deleted

## 2013-11-30 ENCOUNTER — Ambulatory Visit (INDEPENDENT_AMBULATORY_CARE_PROVIDER_SITE_OTHER): Payer: Medicare Other | Admitting: *Deleted

## 2013-11-30 DIAGNOSIS — I5043 Acute on chronic combined systolic (congestive) and diastolic (congestive) heart failure: Secondary | ICD-10-CM

## 2013-11-30 DIAGNOSIS — Z950 Presence of cardiac pacemaker: Secondary | ICD-10-CM

## 2013-11-30 DIAGNOSIS — I509 Heart failure, unspecified: Secondary | ICD-10-CM

## 2013-11-30 LAB — CMP14+EGFR
A/G RATIO: 1.7 (ref 1.1–2.5)
ALK PHOS: 61 IU/L (ref 39–117)
ALT: 9 IU/L (ref 0–32)
AST: 16 IU/L (ref 0–40)
Albumin: 4.5 g/dL (ref 3.5–4.7)
BUN / CREAT RATIO: 14 (ref 11–26)
BUN: 24 mg/dL (ref 8–27)
CALCIUM: 10.4 mg/dL — AB (ref 8.7–10.3)
CO2: 23 mmol/L (ref 18–29)
Chloride: 100 mmol/L (ref 97–108)
Creatinine, Ser: 1.72 mg/dL — ABNORMAL HIGH (ref 0.57–1.00)
GFR calc Af Amer: 31 mL/min/{1.73_m2} — ABNORMAL LOW (ref >59)
GFR calc non Af Amer: 27 mL/min/{1.73_m2} — ABNORMAL LOW (ref >59)
Globulin, Total: 2.7 g/dL (ref 1.5–4.5)
Glucose: 113 mg/dL — ABNORMAL HIGH (ref 65–99)
POTASSIUM: 5.9 mmol/L — AB (ref 3.5–5.2)
SODIUM: 145 mmol/L — AB (ref 134–144)
Total Bilirubin: 0.4 mg/dL (ref 0.0–1.2)
Total Protein: 7.2 g/dL (ref 6.0–8.5)

## 2013-11-30 NOTE — Telephone Encounter (Signed)
Gave pt's caregiver instructions how to send remote. Transmission received. ROV w/ Dr. Sallyanne Kuster 02/15/14.

## 2013-12-01 ENCOUNTER — Other Ambulatory Visit: Payer: Self-pay | Admitting: *Deleted

## 2013-12-01 ENCOUNTER — Telehealth: Payer: Self-pay | Admitting: *Deleted

## 2013-12-01 ENCOUNTER — Encounter: Payer: Self-pay | Admitting: *Deleted

## 2013-12-01 ENCOUNTER — Other Ambulatory Visit (INDEPENDENT_AMBULATORY_CARE_PROVIDER_SITE_OTHER): Payer: Medicare Other

## 2013-12-01 DIAGNOSIS — I4891 Unspecified atrial fibrillation: Secondary | ICD-10-CM

## 2013-12-01 DIAGNOSIS — R7989 Other specified abnormal findings of blood chemistry: Secondary | ICD-10-CM

## 2013-12-01 DIAGNOSIS — I509 Heart failure, unspecified: Secondary | ICD-10-CM

## 2013-12-01 DIAGNOSIS — I442 Atrioventricular block, complete: Secondary | ICD-10-CM

## 2013-12-01 MED ORDER — CARVEDILOL 6.25 MG PO TABS: 6.2500 mg | ORAL_TABLET | Freq: Two times a day (BID) | ORAL | Status: DC

## 2013-12-01 NOTE — Telephone Encounter (Signed)
ICM remote received. I left a message for the patient to call at her home #.

## 2013-12-01 NOTE — Telephone Encounter (Signed)
Rx was sent to pharmacy electronically. 

## 2013-12-01 NOTE — Telephone Encounter (Signed)
I spoke with the patient. 

## 2013-12-01 NOTE — Progress Notes (Signed)
EPIC Encounter for ICM Monitoring  Patient Name: Kimberly [name] is a 80 y.o. female Date: 12/01/2013 Primary Care Physican: Anthoney Harada, MD Primary Cardiologist: Croitoru Electrophysiologist: Croitoru Dry Weight: 130 lbs   Bi-V pacing: 99.4%      In the past month, have you:  1. Gained more than 2 pounds in a day or more than 5 pounds in a week? No. The patient was 137 lbs at her office visit with Dr. Sallyanne Kuster on 11/15/13. Over the last several days, she has been at 130 lbs. Per the patient, this is about a normal weight for her.  2. Had changes in your medications (with verification of current medications)? Yes. On 11/15/13 Dr. Sallyanne Kuster increased the patient's lasix to 60 mg once daily.  3. Had more shortness of breath than is usual for you? No. She does have some SOB with activity, but no change.  4. Limited your activity because of shortness of breath? no  5. Not been able to sleep because of shortness of breath? no  6. Had increased swelling in your feet or ankles? No. Per the patient, it is typical for her to have intermittent left ankle edema.  7. Had symptoms of dehydration (dizziness, dry mouth, increased thirst, decreased urine output) no  8. Had changes in sodium restriction? no  9. Been compliant with medication? Yes   ICM trend:   Follow-up plan: ICM clinic phone appointment: 01/02/14. I reviewed the patient's optivol readings and recent weights with Dr. Sallyanne Kuster. Since the patient's impedence seems to be returning to baseline and she is asymptomatic at this time, then no changes have been ordered for the patient today. Will follow up in 1 month per Dr. Sallyanne Kuster.  Copy of note sent to patient's primary care physician, primary cardiologist, and device following physician.  Alvis Lemmings, RN, BSN 12/01/2013 4:39 PM

## 2013-12-02 LAB — POTASSIUM: Potassium: 3.6 mmol/L (ref 3.5–5.2)

## 2013-12-03 LAB — URINE CULTURE

## 2014-01-02 ENCOUNTER — Encounter: Payer: Medicare Other | Admitting: *Deleted

## 2014-01-05 ENCOUNTER — Ambulatory Visit (INDEPENDENT_AMBULATORY_CARE_PROVIDER_SITE_OTHER): Payer: Medicare Other | Admitting: *Deleted

## 2014-01-05 ENCOUNTER — Telehealth: Payer: Self-pay | Admitting: *Deleted

## 2014-01-05 ENCOUNTER — Encounter: Payer: Self-pay | Admitting: *Deleted

## 2014-01-05 DIAGNOSIS — I5043 Acute on chronic combined systolic (congestive) and diastolic (congestive) heart failure: Secondary | ICD-10-CM

## 2014-01-05 DIAGNOSIS — Z950 Presence of cardiac pacemaker: Secondary | ICD-10-CM

## 2014-01-05 DIAGNOSIS — I509 Heart failure, unspecified: Secondary | ICD-10-CM

## 2014-01-05 NOTE — Telephone Encounter (Signed)
Patient called and has tried to transmit. It sounds like the process is correct, but no transmission received. I asked her to call tech support with [employer].

## 2014-01-05 NOTE — Telephone Encounter (Signed)
I spoke with the patient and reminded her that her ICM transmission is due and to please send a manual today. She is agreeable.

## 2014-01-05 NOTE — Telephone Encounter (Signed)
Transmission received. I spoke with the patient.

## 2014-01-05 NOTE — Progress Notes (Signed)
EPIC Encounter for ICM Monitoring  Patient Name: Kimberly [name] is a 80 y.o. female Date: 01/05/2014 Primary Care Physican: Redge Gainer, MD Primary Cardiologist: Croitoru Electrophysiologist: Croitoru Dry Weight: 130 lbs   Bi-V pacing: 99.4%      In the past month, have you:  1. Gained more than 2 pounds in a day or more than 5 pounds in a week? No. Weight have been stable at 130-131 lbs.  2. Had changes in your medications (with verification of current medications)? no  3. Had more shortness of breath than is usual for you? no  4. Limited your activity because of shortness of breath? no  5. Not been able to sleep because of shortness of breath? no  6. Had increased swelling in your feet or ankles? no  7. Had symptoms of dehydration (dizziness, dry mouth, increased thirst, decreased urine output) no  8. Had changes in sodium restriction? no  9. Been compliant with medication? Yes   ICM trend:   Follow-up plan: ICM clinic phone appointment: 03/20/14. The patient has a follow up appointment on 02/15/14 with Dr. Sallyanne Kuster.  Copy of note sent to patient's primary care physician, primary cardiologist, and device following physician.  Alvis Lemmings, RN, BSN 01/05/2014 11:58 AM

## 2014-02-02 ENCOUNTER — Other Ambulatory Visit: Payer: Self-pay | Admitting: Pharmacist

## 2014-02-03 ENCOUNTER — Other Ambulatory Visit: Payer: Self-pay | Admitting: Pharmacist

## 2014-02-15 ENCOUNTER — Encounter: Payer: Medicare Other | Admitting: Cardiovascular Disease

## 2014-02-23 ENCOUNTER — Telehealth: Payer: Self-pay | Admitting: Cardiovascular Disease

## 2014-02-23 NOTE — Telephone Encounter (Signed)
New message    Patient calling could not make recent appt .

## 2014-02-23 NOTE — Telephone Encounter (Signed)
I spoke with the patient. She was due for an office visit with Dr. Sallyanne Kuster 02/15/14 but had to cancel due to transportation issues. She states it is hard for her to schedule appointments for this reason. I have advised her that I will keep her next ICM transmission for 11/16. We will review her fluid levels at that time and make a decision regarding follow up with Dr. Sallyanne Kuster. She last saw him in July. I asked if she keeps her transmitter hooked up all the time and she states she does not due to her phone line. I advised I will ask one of the device techs to please call her and see if there is a way she keep this hooked up all the time. She is agreeable. Alvis Lemmings, RN, BSN  Charles, since you are so good troubleshooting with the patient's over the phone, can you please call her and speak with her about her transmitter. I would really appreciate it. Alvis Lemmings, RN, BSN

## 2014-02-24 NOTE — Telephone Encounter (Signed)
Pt agreeable to continue sending transmissions as scheduled.

## 2014-03-02 ENCOUNTER — Telehealth: Payer: Self-pay | Admitting: *Deleted

## 2014-03-02 NOTE — Telephone Encounter (Signed)
Received fax from [facility] for a refill on temazepam 30mg  take 1 capsule at bedtime. Not on current med list. Looks like she has this filled some time ago in 2013. Do you want to refill?

## 2014-03-03 NOTE — Telephone Encounter (Signed)
Did not refill this prescription

## 2014-03-06 ENCOUNTER — Encounter: Payer: Self-pay | Admitting: Family Medicine

## 2014-03-20 ENCOUNTER — Encounter: Payer: Medicare Other | Admitting: *Deleted

## 2014-03-20 ENCOUNTER — Telehealth: Payer: Self-pay | Admitting: Cardiology

## 2014-03-20 NOTE — Telephone Encounter (Signed)
Spoke with pt and reminded pt of remote transmission that is due today. Pt verbalized understanding.   

## 2014-03-24 ENCOUNTER — Encounter: Payer: Self-pay | Admitting: Cardiology

## 2014-03-29 ENCOUNTER — Telehealth: Payer: Self-pay | Admitting: Cardiovascular Disease

## 2014-03-29 ENCOUNTER — Other Ambulatory Visit: Payer: Self-pay | Admitting: *Deleted

## 2014-03-29 NOTE — Telephone Encounter (Signed)
New Msg   Patient is requesting a call, not sure if transmission was received for pacemaker. Please contact at [phone]

## 2014-03-29 NOTE — Telephone Encounter (Signed)
Attempted to call pt back. No answer and was unable to leave a message

## 2014-03-29 NOTE — Telephone Encounter (Signed)
Patient waiting for a call for a device check. Informed patient I will send this info to the device clinic at [facility] and have them call to get this taken care of.  Patient voiced understanding and will await the call.

## 2014-03-29 NOTE — Telephone Encounter (Signed)
Patient has no office visit in [place] except for immunizations. Hydrocodone last refilled on 02-28-14 for #60. Also received a fax for a refill for temazepam 30mg . Take 1 capsule at bedtime #30. Last filled on 02-28-14. Does not show on current med list. Per fax she has been receiving from [employer] who is a Tour manager that does home health. Please advise on refills. Has a scheduled appt with Dr Sabra Heck on 04-06-14.

## 2014-03-29 NOTE — Telephone Encounter (Signed)
Please call,suppose to be getting her device checked and technician was supposed to have called. She said she have not heard from anybody,she asked for you.

## 2014-03-29 NOTE — Telephone Encounter (Signed)
Asked pt to transmit today. Patient voiced understanding.

## 2014-03-29 NOTE — Telephone Encounter (Signed)
We are unable to refill this prescription anymore because the patient has not been seen in a long time. She will need to keep the appointment with Dr. Sabra Heck for refills to be done on this pain medication

## 2014-04-03 ENCOUNTER — Other Ambulatory Visit: Payer: Self-pay

## 2014-04-03 ENCOUNTER — Telehealth: Payer: Self-pay | Admitting: Cardiovascular Disease

## 2014-04-03 NOTE — Telephone Encounter (Signed)
Last seen 11/29/13  Dr Sabra Heck   If approved print and route to nurse

## 2014-04-03 NOTE — Telephone Encounter (Signed)
NTBS for refills- saw Dr. Sabra Heck last

## 2014-04-03 NOTE — Telephone Encounter (Signed)
Last seen 11/29/13 Dr Sabra Heck   If approved print and route to nurse

## 2014-04-03 NOTE — Telephone Encounter (Signed)
Pt informed me that she was to receive a piece for her home monitor and that as soon as she received piece she would send transmission at that time.

## 2014-04-03 NOTE — Telephone Encounter (Signed)
New Msg   Patient calling to report that she was supposed to have a pacer check but wasn't able to get here. Also states [employer] will be sending in some info. Patient would like to receive a call in regards to this.

## 2014-04-05 ENCOUNTER — Telehealth: Payer: Self-pay | Admitting: Cardiology

## 2014-04-05 NOTE — Telephone Encounter (Signed)
Pt called and stated that she is waiting for her wirex box w/ [employer] before she can send transmission. She stated that [employer] informed her that it may take a few weeks b/c of the volume of wirex box they have had to send out to pts. I asked pt if she has been experiencing any new or worsening symptoms. Pt stated she feels fine and no new or worsening symptoms. She will send transmission when she receives her wirex box. Pt also wanted to make it clear that she may be having surgery in the near future in her groan area. I informed her that if her surgeon wants a surgical clearance we can get her appt w/ the MD to be seen. She verbalized understanding.

## 2014-04-06 ENCOUNTER — Ambulatory Visit: Payer: Medicare Other | Admitting: Family Medicine

## 2014-04-10 ENCOUNTER — Telehealth: Payer: Self-pay | Admitting: Cardiovascular Disease

## 2014-04-10 ENCOUNTER — Encounter: Payer: Self-pay | Admitting: *Deleted

## 2014-04-10 ENCOUNTER — Ambulatory Visit (INDEPENDENT_AMBULATORY_CARE_PROVIDER_SITE_OTHER): Payer: Medicare Other | Admitting: Family Medicine

## 2014-04-10 ENCOUNTER — Telehealth: Payer: Self-pay | Admitting: Family Medicine

## 2014-04-10 ENCOUNTER — Encounter: Payer: Self-pay | Admitting: Family Medicine

## 2014-04-10 VITALS — BP 143/52 | HR 83 | Temp 97.0°F

## 2014-04-10 DIAGNOSIS — I482 Chronic atrial fibrillation, unspecified: Secondary | ICD-10-CM

## 2014-04-10 DIAGNOSIS — N189 Chronic kidney disease, unspecified: Secondary | ICD-10-CM

## 2014-04-10 DIAGNOSIS — E876 Hypokalemia: Secondary | ICD-10-CM

## 2014-04-10 DIAGNOSIS — D508 Other iron deficiency anemias: Secondary | ICD-10-CM

## 2014-04-10 DIAGNOSIS — N390 Urinary tract infection, site not specified: Secondary | ICD-10-CM

## 2014-04-10 LAB — POCT URINALYSIS DIPSTICK
Bilirubin, UA: NEGATIVE
Blood, UA: NEGATIVE
Glucose, UA: NEGATIVE
Ketones, UA: NEGATIVE
Nitrite, UA: NEGATIVE
Protein, UA: NEGATIVE
Spec Grav, UA: 1.01
Urobilinogen, UA: NEGATIVE
pH, UA: 6.5

## 2014-04-10 LAB — POCT CBC
Granulocyte percent: 73.3 %G (ref 37–80)
HCT, POC: 31.6 % — AB (ref 37.7–47.9)
Hemoglobin: 10.4 g/dL — AB (ref 12.2–16.2)
Lymph, poc: 1.7 (ref 0.6–3.4)
MCH, POC: 28.5 pg (ref 27–31.2)
MCHC: 32.9 g/dL (ref 31.8–35.4)
MCV: 86.7 fL (ref 80–97)
MPV: 8 fL (ref 0–99.8)
POC Granulocyte: 6.6 (ref 2–6.9)
POC LYMPH PERCENT: 19.3 %L (ref 10–50)
Platelet Count, POC: 266 10*3/uL (ref 142–424)
RBC: 3.6 M/uL — AB (ref 4.04–5.48)
RDW, POC: 18 %
WBC: 9 10*3/uL (ref 4.6–10.2)

## 2014-04-10 LAB — POCT UA - MICROSCOPIC ONLY
Casts, Ur, LPF, POC: NEGATIVE
Crystals, Ur, HPF, POC: NEGATIVE
Mucus, UA: NEGATIVE
Yeast, UA: NEGATIVE

## 2014-04-10 MED ORDER — CIPROFLOXACIN HCL 500 MG PO TABS: 500.0000 mg | ORAL_TABLET | Freq: Two times a day (BID) | ORAL | Status: DC

## 2014-04-10 NOTE — Telephone Encounter (Signed)
Spoke with patient. She was seen in [facility] and stayed there a few nights. She had a "kidney problem" and her "hemoglobin was screwed up". She reports she got 2 units PRBCs. When asked if she had had active bleeding, she did not think she did - and no endoscopy or colonoscopy was performed.   Dr. Freida Busman of [facility] (spelling?) had recommended patient STOP eliquis and instead take aspirin 81mg  daily.  This MD also decreased furosemide to 40mg  QD.  This MD also prescribed ferrous sulfate - which patient had an allergic rxn to (tongue swelling) and has since stopped - this has been added to allergy list.   Patient would like to Dr. Loletha Grayer to review this information and advise her on asa and eliquis. She has not made a change r/t this, as she wanted his opinion first.   Routed to Dr. Sallyanne Kuster to review & advise (if needed, RN can obtain records)

## 2014-04-10 NOTE — Progress Notes (Signed)
Subjective:    Patient ID: Kimberly [name], female    DOB: 10-23-1926, 80 y.o.   MRN: [id number]  HPI Patient was discharged from [facility] for CKD, UTI, anemia, s/p 2 units prbc infusion, atrial fibrillation and oral anticoagulation.  She sees cardiology.  She has hx of iron allergy.  She was sent home from [facility] on po iron sulfate.  She called myself yesterday on call and was told to DC the iron.  She is feeling better today.  She is taking omeprazole.  Review of Systems  Constitutional: Negative for fever.  HENT: Negative for ear pain.   Eyes: Negative for discharge.  Respiratory: Negative for cough.   Cardiovascular: Negative for chest pain.  Gastrointestinal: Negative for abdominal distention.  Endocrine: Negative for polyuria.  Genitourinary: Negative for difficulty urinating.  Musculoskeletal: Negative for gait problem and neck pain.  Skin: Negative for color change and rash.  Neurological: Negative for speech difficulty and headaches.  Psychiatric/Behavioral: Negative for agitation.       Objective:    BP 143/52 mmHg  Pulse 83  Temp(Src) 97 F (36.1 C) (Oral)  Wt  Physical Exam  Constitutional: She is oriented to person, place, and time.  Chronically ill appearing femal in [place]:  Head: Normocephalic and atraumatic.  Mouth/Throat: Oropharynx is clear and moist.  Eyes: Pupils are equal, round, and reactive to light.  Neck: Normal range of motion. Neck supple.  Cardiovascular: Normal rate.   No murmur heard. Pulmonary/Chest: Effort normal and breath sounds normal.  Abdominal: Soft. Bowel sounds are normal. There is no tenderness.  Neurological: She is alert and oriented to person, place, and time.  Skin: Skin is warm and dry.  Psychiatric: She has a normal mood and affect.      Results for orders placed or performed in visit on 04/10/14  POCT urinalysis dipstick  Result Value Ref Range   Color, UA straw    Clarity, UA clear    Glucose,  UA neg    Bilirubin, UA neg    Ketones, UA neg    Spec Grav, UA 1.010    Blood, UA neg    pH, UA 6.5    Protein, UA neg    Urobilinogen, UA negative    Nitrite, UA neg    Leukocytes, UA large (3+)   POCT CBC  Result Value Ref Range   WBC 9.0 4.6 - 10.2 K/uL   Lymph, poc 1.7 0.6 - 3.4   POC LYMPH PERCENT 19.3 10 - 50 %L   POC Granulocyte 6.6 2 - 6.9   Granulocyte percent 73.3 37 - 80 %G   RBC 3.6 (A) 4.04 - 5.48 M/uL   Hemoglobin 10.4 (A) 12.2 - 16.2 g/dL   HCT, POC 31.6 (A) 37.7 - 47.9 %   MCV 86.7 80 - 97 fL   MCH, POC 28.5 27 - 31.2 pg   MCHC 32.9 31.8 - 35.4 g/dL   RDW, POC 18.0 %   Platelet Count, POC 266.0 142 - 424 K/uL   MPV 8.0 0 - 99.8 fL   Assessment & Plan:     ICD-9-CM ICD-10-CM   1. Other iron deficiency anemias 280.8 D50.8 POCT CBC     Anemia panel     Ambulatory referral to Hematology  2. CKD (chronic kidney disease), unspecified stage 585.9 N18.9 BMP8+EGFR  3. Chronic atrial fibrillation 427.31 I48.2 BMP8+EGFR  4. Hypokalemia 276.8 E87.6 BMP8+EGFR  5. Urinary tract infection without hematuria,  site unspecified 599.0 N39.0 POCT UA - Microscopic Only     POCT urinalysis dipstick     Urine culture     ciprofloxacin (CIPRO) 500 MG tablet   Unable to tolerate po iron so DC and will check iron level and discussed  referral to Hematology due to iron allergy.  Follow up with Dr. Sabra Heck in 2 weeks.  Return if symptoms worsen or fail to improve.  Lysbeth Penner FNP

## 2014-04-10 NOTE — Telephone Encounter (Signed)
Faxed request for records sent to [phone] ([place] Records Department)

## 2014-04-10 NOTE — Telephone Encounter (Signed)
Pt called in stating that she was just prescribed baby aspirin and lasix and she would like to know if it is all right for her to take these meds. Please call  Thanks

## 2014-04-10 NOTE — Telephone Encounter (Signed)
Difficult situation if there is bleeding and iron deficiency involved. The risk of stroke without blood thinner is about 10% per year. Aspirin will have only minor benefit, reducing it to about 8% per year. Eliquis and medications like it will reduce the risk to about 2.5% per year, but with twice the bleeding risk of aspirin. In the balance, I still think the Eliquis superior to aspirin. However, I do not have all the information from her recent hospitalization. I will try to get those records and suggest that we meet in the office to discuss the options (first available in January). The furosemide was probably decreased function deteriorated. I would take her physician's advice on adjusting the dose. Please call us if she becomes short of breath or develops swelling of her legs.

## 2014-04-10 NOTE — Telephone Encounter (Signed)
Records reviewed by Dr. Loletha Grayer and he advised her to continue eliquis and lower dose of lasix Rx'ed. Patient is to see PCP this afternoon. Patient voiced understanding of plan.

## 2014-04-11 ENCOUNTER — Telehealth: Payer: Self-pay | Admitting: Family Medicine

## 2014-04-11 DIAGNOSIS — N390 Urinary tract infection, site not specified: Secondary | ICD-10-CM

## 2014-04-11 LAB — BMP8+EGFR
BUN/Creatinine Ratio: 17 (ref 11–26)
BUN: 21 mg/dL (ref 8–27)
CO2: 20 mmol/L (ref 18–29)
Calcium: 8.7 mg/dL (ref 8.7–10.3)
Chloride: 102 mmol/L (ref 97–108)
Creatinine, Ser: 1.26 mg/dL — ABNORMAL HIGH (ref 0.57–1.00)
GFR calc Af Amer: 45 mL/min/{1.73_m2} — ABNORMAL LOW (ref >59)
GFR calc non Af Amer: 39 mL/min/{1.73_m2} — ABNORMAL LOW (ref >59)
Glucose: 92 mg/dL (ref 65–99)
Potassium: 5.1 mmol/L (ref 3.5–5.2)
Sodium: 140 mmol/L (ref 134–144)

## 2014-04-11 LAB — ANEMIA PROFILE B
Basophils Absolute: 0 10*3/uL (ref 0.0–0.2)
Basos: 0 %
Eos: 3 %
Eosinophils Absolute: 0.2 10*3/uL (ref 0.0–0.4)
Ferritin: 142 ng/mL (ref 15–150)
Folate: 8.9 ng/mL (ref >3.0)
HCT: 31.7 % — ABNORMAL LOW (ref 34.0–46.6)
Hemoglobin: 10.3 g/dL — ABNORMAL LOW (ref 11.1–15.9)
Immature Grans (Abs): 0 10*3/uL (ref 0.0–0.1)
Immature Granulocytes: 0 %
Iron Saturation: 5 % — CL (ref 15–55)
Iron: 18 ug/dL — ABNORMAL LOW (ref 35–155)
Lymphocytes Absolute: 1.7 10*3/uL (ref 0.7–3.1)
Lymphs: 19 %
MCH: 27.8 pg (ref 26.6–33.0)
MCHC: 32.5 g/dL (ref 31.5–35.7)
MCV: 85 fL (ref 79–97)
Monocytes Absolute: 0.9 10*3/uL (ref 0.1–0.9)
Monocytes: 10 %
Neutrophils Absolute: 6 10*3/uL (ref 1.4–7.0)
Neutrophils Relative %: 68 %
Platelets: 295 10*3/uL (ref 150–379)
RBC: 3.71 x10E6/uL — ABNORMAL LOW (ref 3.77–5.28)
RDW: 17.9 % — ABNORMAL HIGH (ref 12.3–15.4)
Retic Ct Pct: 1.8 % (ref 0.6–2.6)
TIBC: 347 ug/dL (ref 250–450)
UIBC: 329 ug/dL (ref 150–375)
Vitamin B-12: 431 pg/mL (ref 211–946)
WBC: 8.9 10*3/uL (ref 3.4–10.8)

## 2014-04-11 LAB — SPECIMEN STATUS REPORT

## 2014-04-11 LAB — URINE CULTURE: Organism ID, Bacteria: NO GROWTH

## 2014-04-11 MED ORDER — CIPROFLOXACIN HCL 500 MG PO TABS: 500.0000 mg | ORAL_TABLET | Freq: Two times a day (BID) | ORAL | Status: DC

## 2014-04-11 NOTE — Telephone Encounter (Signed)
Appointment given for 12/22 with Sabra Heck at 8:00am.

## 2014-04-11 NOTE — Addendum Note (Signed)
Addended by: Earlene Plater on: 04/11/2014 08:37 AM   Modules accepted: Orders

## 2014-04-11 NOTE — Telephone Encounter (Signed)
done

## 2014-04-12 ENCOUNTER — Telehealth: Payer: Self-pay | Admitting: Family Medicine

## 2014-04-12 NOTE — Telephone Encounter (Signed)
Hands and feet swelling. Only new med is the antibiotic she started this week. No SOB or chest pain. Please advise.

## 2014-04-12 NOTE — Telephone Encounter (Signed)
No the medicine isn't causing this

## 2014-04-12 NOTE — Telephone Encounter (Signed)
Patient advised to follow up with her cardiologist soon.  Keep feet elevated.  Monitor fluid intake and output and weigh every morning.  If weight goes up 2 or 3 pounds, call us or come in.

## 2014-04-13 ENCOUNTER — Telehealth: Payer: Self-pay | Admitting: Cardiovascular Disease

## 2014-04-13 NOTE — Telephone Encounter (Signed)
New Msg   Pt calling to get her upgrade on the machine to process transmissions. Please call [phone].

## 2014-04-13 NOTE — Telephone Encounter (Signed)
Forwarding to device clinic 

## 2014-04-14 NOTE — Telephone Encounter (Signed)
Patient informed that appt with [facility] needed. Patient voiced understanding. Will defer scheduling to [place].

## 2014-04-14 NOTE — Telephone Encounter (Signed)
Awaiting some attachment for her transmitter.  Informed patient I will send this call to the device department and someone will call her.

## 2014-04-14 NOTE — Telephone Encounter (Signed)
Pt is calling to speak to [place]. Please call  Thanks

## 2014-04-21 ENCOUNTER — Telehealth: Payer: Self-pay | Admitting: Cardiovascular Disease

## 2014-04-21 ENCOUNTER — Telehealth: Payer: Self-pay | Admitting: Family Medicine

## 2014-04-21 NOTE — Telephone Encounter (Signed)
Instructed pt that wirex was ordered on 03-29-14 she should have it by 05-12-14. Pt verbalized understanding.

## 2014-04-21 NOTE — Telephone Encounter (Signed)
Pt would like to know how would she transmit her pacemaker if she only have a cell phone.?

## 2014-04-21 NOTE — Telephone Encounter (Signed)
Attemtped to return pt phone call. No answer and was unable to leave a message.

## 2014-04-24 ENCOUNTER — Telehealth: Payer: Self-pay | Admitting: Family Medicine

## 2014-04-24 MED ORDER — HYDROCODONE-ACETAMINOPHEN 5-325 MG PO TABS: 1.0000 | ORAL_TABLET | Freq: Four times a day (QID) | ORAL | Status: DC | PRN

## 2014-04-24 NOTE — Telephone Encounter (Signed)
Aware, script done.

## 2014-04-24 NOTE — Telephone Encounter (Signed)
Duplicate message. 

## 2014-04-24 NOTE — Telephone Encounter (Signed)
She has an appt with Dr. Sabra Heck tomorrow.  Her head has been hurting all night and this morning .  She is taking a blood thinner and has to be careful with that.  Could you please send some pain medication to [place]?

## 2014-04-25 ENCOUNTER — Ambulatory Visit (INDEPENDENT_AMBULATORY_CARE_PROVIDER_SITE_OTHER): Payer: Medicare Other

## 2014-04-25 ENCOUNTER — Telehealth: Payer: Self-pay | Admitting: *Deleted

## 2014-04-25 ENCOUNTER — Ambulatory Visit: Payer: Medicare Other | Admitting: Family Medicine

## 2014-04-25 ENCOUNTER — Encounter: Payer: Self-pay | Admitting: Family Medicine

## 2014-04-25 ENCOUNTER — Telehealth: Payer: Self-pay | Admitting: Cardiovascular Disease

## 2014-04-25 ENCOUNTER — Ambulatory Visit (INDEPENDENT_AMBULATORY_CARE_PROVIDER_SITE_OTHER): Payer: Medicare Other | Admitting: Family Medicine

## 2014-04-25 VITALS — BP 153/75 | HR 76 | Temp 97.1°F | Ht 67.0 in | Wt 135.6 lb

## 2014-04-25 DIAGNOSIS — N390 Urinary tract infection, site not specified: Secondary | ICD-10-CM

## 2014-04-25 DIAGNOSIS — K921 Melena: Secondary | ICD-10-CM

## 2014-04-25 DIAGNOSIS — G8929 Other chronic pain: Secondary | ICD-10-CM

## 2014-04-25 DIAGNOSIS — M545 Low back pain, unspecified: Secondary | ICD-10-CM

## 2014-04-25 LAB — POCT URINALYSIS DIPSTICK
Bilirubin, UA: NEGATIVE
Glucose, UA: NEGATIVE
Ketones, UA: NEGATIVE
LEUKOCYTES UA: NEGATIVE
Nitrite, UA: NEGATIVE
PH UA: 6.5
Protein, UA: NEGATIVE
Spec Grav, UA: <=1.005
UROBILINOGEN UA: NEGATIVE

## 2014-04-25 LAB — POCT UA - MICROSCOPIC ONLY
CASTS, UR, LPF, POC: NEGATIVE
MUCUS UA: NEGATIVE
RBC, urine, microscopic: NEGATIVE
WBC, Ur, HPF, POC: NEGATIVE
Yeast, UA: NEGATIVE

## 2014-04-25 MED ORDER — SUVOREXANT 5 MG PO TABS: 5.0000 mg | ORAL_TABLET | Freq: Every evening | ORAL | Status: DC | PRN

## 2014-04-25 NOTE — Progress Notes (Signed)
   Subjective:    Patient ID: Kimberly [name], female    DOB: 1927-03-31, 80 y.o.   MRN: [id number]  HPI 80 year old female who presents today with several concerns. She gives me a stapled a pistol where she has written out or concerns. Briefly these relate to insomnia. She feels like the temazepam 30 mg is no longer effective. Her daughter takes trazodone we talked about that but decided in favor of L Sombra in addition she is having some bleeding, bright red the lower GI tract. She has had a history of diverticular disease and abscess but she is concerned about the source of the bleeding we talked about diverticular disease, arteriovenous malformations, and other. She says that if she needs other treatments including surgery she is willing to undergo those so even though I am reluctant to have her undergo invasive procedures at her age I will refer to let the gastroenterologist sort out the necessity of endoscopic exam. She also complains of pain in her back there is a history of scoliosis.    Review of Systems  Constitutional: Negative.   HENT: Negative.   Respiratory: Negative.   Cardiovascular: Negative.   Gastrointestinal: Positive for blood in stool.  Musculoskeletal: Positive for back pain.  Psychiatric/Behavioral: Negative.        Objective:   Physical Exam  Constitutional:  Slight stature and build seated in a wheelchair. She mostly gets around in a wheelchair but I think she walks around her small apartment holding onto walls. Ambulation is also limited by her low vision with macular degeneration  HENT:  Head: Normocephalic and atraumatic.  Cardiovascular: Normal rate.   Rhythm seems regular today and she does not appear to be in atrial fibrillation. She does have pacemaker in place  Pulmonary/Chest: Effort normal.  Abdominal: Soft. Bowel sounds are normal.  Musculoskeletal:  X-ray of her back is hard to interpret but shows severe scoliosis with lots of degenerative changes and  I cannot really comment as to could there be a compression fracture or narrowing of the disc space  Psychiatric: She has a normal mood and affect. Her behavior is normal.   BP 153/75 mmHg  Pulse 76  Temp(Src) 97.1 F (36.2 C) (Oral)  Ht 5\' 7"  (1.702 m)  Wt 135 lb 9.6 oz (61.508 kg)  BMI 21.23 kg/m2       Assessment & Plan:  1. Urinary tract infection without hematuria, site unspecified  - POCT UA - Microscopic Only - POCT urinalysis dipstick  2. Chronic LBP Continue with hydrocodone. - DG Lumbar Spine 2-3 Views

## 2014-04-25 NOTE — Telephone Encounter (Signed)
New message     Pt says a nurse from here called her and she is returning their call

## 2014-04-25 NOTE — Telephone Encounter (Signed)
Script for Belsomra 5 mg called to pharmacy.   Medicine comes in packs of 10.  One pack and 1 refill ordered.   This medicine may require prior approval.   Pharmacy will notify us if necessary.

## 2014-04-25 NOTE — Telephone Encounter (Signed)
Returned call to patient. Informed her there is no documentation in EPIC where anyone from cardiology called her today - last tele note 12/18 regarding her home transmitter. Patient voiced understanding of this. She states her back has gotten worse - per lumbar xray from PCP

## 2014-04-26 ENCOUNTER — Encounter: Payer: Self-pay | Admitting: Gastroenterology

## 2014-04-27 ENCOUNTER — Telehealth: Payer: Self-pay | Admitting: Family Medicine

## 2014-04-27 ENCOUNTER — Other Ambulatory Visit: Payer: Self-pay | Admitting: Family Medicine

## 2014-04-27 MED ORDER — SUVOREXANT 5 MG PO TABS: 5.0000 mg | ORAL_TABLET | Freq: Every evening | ORAL | Status: DC | PRN

## 2014-04-27 NOTE — Telephone Encounter (Signed)
A rf was called to [employer] for belsomra and on iron supplement

## 2014-05-01 ENCOUNTER — Other Ambulatory Visit: Payer: Self-pay | Admitting: *Deleted

## 2014-05-01 ENCOUNTER — Telehealth: Payer: Self-pay | Admitting: Family Medicine

## 2014-05-01 MED ORDER — APIXABAN 2.5 MG PO TABS: ORAL_TABLET | ORAL | Status: DC

## 2014-05-01 NOTE — Telephone Encounter (Signed)
Pt aware of x-ray results and voiced a  better understanding of these results

## 2014-05-03 ENCOUNTER — Telehealth: Payer: Self-pay | Admitting: Family Medicine

## 2014-05-03 MED ORDER — POTASSIUM CHLORIDE CRYS ER 20 MEQ PO TBCR: 20.0000 meq | EXTENDED_RELEASE_TABLET | Freq: Two times a day (BID) | ORAL | Status: DC

## 2014-05-03 NOTE — Telephone Encounter (Signed)
As long as she takes lasix, probably needs K supplement

## 2014-05-03 NOTE — Telephone Encounter (Signed)
Pt aware and med sent  

## 2014-05-03 NOTE — Telephone Encounter (Signed)
Stp she couldn't sleep last night but had slept well the night prior, she was seen by Dr.Miller 04/25/14 and given new RX for L Sombra, pt advised she should give it some time to see if it works as well as trying other things to help sleep at night such as establishing a night time routine. Chamomile tea, warm bath, lavender lotion and lavender candles to help promote rest and relaxation. Pt voiced understanding and will given suggestions a try. If continues to have problems sleeping will call back.

## 2014-05-03 NOTE — Telephone Encounter (Signed)
Last BMP 04/10/14 - potassium level was 5.1  Should she continue - or come in for another level?

## 2014-05-04 ENCOUNTER — Telehealth: Payer: Self-pay | Admitting: Cardiovascular Disease

## 2014-05-04 NOTE — Telephone Encounter (Signed)
Called & spoke with pt to clarify question regarding device transmission through a cell phone; she is not replacing her landline, so she would still be able to use it for remote downloads.   She has the [employer] Consulta. Told her I was not sure how this is set up, would give to [facility] who would have a better answer. Also suggested consulting the device manufacturer.

## 2014-05-04 NOTE — Telephone Encounter (Signed)
Pt called in stating that she will be getting a cell phone soon and she would like to know how to do the remote device transmission through her cell phone. Please call  Thanks

## 2014-05-07 NOTE — Progress Notes (Deleted)
[facility] CONSULT NOTE  Patient Care Team: Wardell Honour, MD as PCP - General (Family Medicine) Sanda Klein, MD as Attending Physician (Cardiology)  CHIEF COMPLAINTS/PURPOSE OF CONSULTATION:  Anemia, multifactorial including iron deficiency History of recent transfusion of PRBC Intolerance to oral iron  HISTORY OF PRESENTING ILLNESS:  Kimberly [name] 81 y.o. female is here because of ***  MEDICAL HISTORY:  Past Medical History  Diagnosis Date  . Depression   . Atrial fibrillation   . Scoliosis   . Osteoarthritis   . Pacemaker     Last saw cards 07/2013  . Fibromyalgia   . Hypertension   . Glaucoma   . Acute diverticulitis 08/24/2013  . CKD (chronic kidney disease) stage 3, GFR 30-59 ml/min 10/12/2012  . Chronic anticoagulation 10/12/2012  . Acute blood loss anemia 10/11/2012  . Antral ulcer 10/11/2012  . Erosive esophagitis 10/11/2012  . Cardiomyopathy, nonischemic   . Acute on chronic combined systolic and diastolic CHF, NYHA class 4 11/15/2013    SURGICAL HISTORY: Past Surgical History  Procedure Laterality Date  . Pacemaker insertion    . Abdominal hysterectomy    . Cholecystectomy    . Appendectomy    . Breast surgery    . Back surgery    . Neck surgery    . Hernia repair      right inguinal hernia and umbilical  . Tonsillectomy    . Esophagogastroduodenoscopy N/A 10/13/2012    Procedure: ESOPHAGOGASTRODUODENOSCOPY (EGD);  Surgeon: Daneil Dolin, MD;  Location: [facility];  Service: Endoscopy;  Laterality: N/A;  . Cystoscopy N/A 02/24/2013    Procedure: CYSTOSCOPY WITH URETHRAL DILITATION;  Surgeon: Marissa Nestle, MD;  Location: [facility];  Service: Urology;  Laterality: N/A;  . Doppler echocardiography N/A 05/30/2010    LV SIZE IS NORMAL. LV SYSTOLIC FUNCTION IS LOW NORMAL. EF=50-55%. MILD INFERIOR HYPOKINESIS.MILD TO MODERATE POSTERIOR WALL HYPOKINESIS.PACEMAKER LEAD IN THE RV. LA IS MILDLY DILATED. RA IS MODERATE TO SEVERLY DILATED. PACEMAKER  LEAD IN THE RA. MILD CALCICICATION OF THE MV APPARATUS. MODERATE MR. MILD TO MODERATE TR. MILD PHTN.AV MILDLY SCLEROTIC.  . Nuclear stress test N/A 02/13/2009    NORMAL PATTERN OF PERFUSION IN ALL REGIONS. POST STRESS VENTICULAR SIZE IS NORMAL. POST  STESS EF 85%.  NORMAL MYOCARDIAL PERFUSION STUDY.  . Cardiac catheterization  12/08/2005    LAD AND LEFT MAIN WITH NO HIGH-GRADE STENOSIS. MILD DISEASE IN THE CX AND LAD SYSTEM. SEVERE LV DYSFUNCTION WITH DILATION OF THE LV. EF 15-20%. LV END-DIASTOLIC PRESSURE IS 90. +1 MR.  Fawn Kirk ext venous Bilateral 11-08-10    R & L- NO EVIDENCE OF THROMBUS OR THROMBOPHLEBITIS. THERE IS MILD AMOUNT OF SUBCUTANEOUS EDEMA NOTED WITHIN THE LEFT CALF AND ANKLE. R & L GSV AND SSV- NO VENOUS INSUFF NOTED.    SOCIAL HISTORY: History   Social History  . Marital Status: Widowed    Spouse Name: N/A    Number of Children: N/A  . Years of Education: N/A   Occupational History  . telephone operator     retired   Social History Main Topics  . Smoking status: Former Smoker -- 0.50 packs/day for 30 years    Types: Cigarettes    Quit date: 12/13/2004  . Smokeless tobacco: Not on file  . Alcohol Use: No     Comment: history of drinking a glass of wine in the evening  . Drug Use: No  . Sexual Activity: No   Other Topics  Concern  . Not on file   Social History Narrative    FAMILY HISTORY: Family History  Problem Relation Age of Onset  . Cancer Sister   . Asthma Sister   . Heart failure Brother   . Colon cancer Neg Hx     ALLERGIES:  is allergic to iron; iodine; penicillins; and sulfa antibiotics.  MEDICATIONS:  Current Outpatient Prescriptions  Medication Sig Dispense Refill  . apixaban (ELIQUIS) 2.5 MG TABS tablet Take 1 tablet by mouth  twice a day 180 tablet 0  . carvedilol (COREG) 6.25 MG tablet Take 1 tablet (6.25 mg total) by mouth 2 (two) times daily with a meal. 180 tablet 3  . furosemide (LASIX) 20 MG tablet Take 3 tablets (60 mg total) by  mouth daily. 270 tablet 3  . HYDROcodone-acetaminophen (NORCO/VICODIN) 5-325 MG per tablet Take 1-2 tablets by mouth every 6 (six) hours as needed for moderate pain. 30 tablet 0  . omeprazole (PRILOSEC) 20 MG capsule Take 20 mg by mouth daily.    [name] PARoxetine (PAXIL) 40 MG tablet     . potassium chloride SA (K-DUR,KLOR-CON) 20 MEQ tablet Take 1 tablet (20 mEq total) by mouth 2 (two) times daily. 180 tablet 1  . Suvorexant (BELSOMRA) 5 MG TABS Take 5 mg by mouth at bedtime as needed and may repeat dose one time if needed. 12 tablet 1  . temazepam (RESTORIL) 30 MG capsule Take 30 mg by mouth at bedtime as needed for sleep.     No current facility-administered medications for this visit.    ROS  PHYSICAL EXAMINATION: ECOG PERFORMANCE STATUS: {CHL ONC ECOG [phone]}  There were no vitals filed for this visit. There were no vitals filed for this visit.   Physical Exam   LABORATORY DATA:  I have reviewed the data as listed Lab Results  Component Value Date   WBC 9.0 04/10/2014   HGB 10.4* 04/10/2014   HCT 31.6* 04/10/2014   MCV 86.7 04/10/2014   PLT 295 04/10/2014     Chemistry      Component Value Date/Time   NA 140 04/10/2014 1713   NA 139 09/14/2013 0412   K 5.1 04/10/2014 1713   CL 102 04/10/2014 1713   CO2 20 04/10/2014 1713   BUN 21 04/10/2014 1713   BUN 13 09/14/2013 0412   CREATININE 1.26* 04/10/2014 1713   CREATININE 1.48* 10/11/2012 1134      Component Value Date/Time   CALCIUM 8.7 04/10/2014 1713   ALKPHOS 61 11/29/2013 1633   AST 16 11/29/2013 1633   ALT 9 11/29/2013 1633   BILITOT 0.4 11/29/2013 1633       RADIOGRAPHIC STUDIES: I have personally reviewed the radiological images as listed and agreed with the findings in the report. No results found.  ASSESSMENT & PLAN:  No problem-specific assessment & plan notes found for this encounter.  No orders of the defined types were placed in this encounter.    All questions were answered. The  patient knows to call the clinic with any problems, questions or concerns. I spent {CHL ONC TIME VISIT - [id number] counseling the patient face to face. The total time spent in the appointment was {CHL ONC TIME VISIT - [id number] and more than 50% was on counseling.     Molli Hazard, MD 05/07/2014 9:05 PM     This encounter was created in error - please disregard.

## 2014-05-08 ENCOUNTER — Telehealth: Payer: Self-pay | Admitting: Family Medicine

## 2014-05-08 ENCOUNTER — Telehealth: Payer: Self-pay | Admitting: Cardiovascular Disease

## 2014-05-08 ENCOUNTER — Telehealth: Payer: Self-pay | Admitting: *Deleted

## 2014-05-08 ENCOUNTER — Ambulatory Visit (HOSPITAL_COMMUNITY): Payer: Medicare Other | Admitting: Hematology & Oncology

## 2014-05-08 MED ORDER — APIXABAN 2.5 MG PO TABS: ORAL_TABLET | ORAL | Status: DC

## 2014-05-08 NOTE — Telephone Encounter (Signed)
Question deferred to [employer]

## 2014-05-08 NOTE — Telephone Encounter (Signed)
Called pt, it was sent to [facility]

## 2014-05-08 NOTE — Telephone Encounter (Signed)
Returned call to patient. Informed her that her PCP office refilled her eliquis on 12/28 to [facility] for 90 day supply and that they had previously refilled this medication for a 90 day supply in October. Patient was confused that her PCP office was refilling this medication despite me explaining that it was documented in our computer system that they had done so. She states she has been out of eliquis for a couple of weeks and has been taking aspirin instead. She states she called our office requesting a refill mid-December and was told she had to wait til first of the year, but there is NO documentation that she called in December requesting a refill and I assured her had she called in and the message was sent to the nurse, the medication would NOT have been denied - however, these refills of late have been going to PCP office.   Patient states she will call [facility] to inquire about the status of her refills. Advised to her contact her PCP to tell them she was out of her Eliquis for a few weeks. Will inform Dr. Sallyanne Kuster as Juluis Rainier

## 2014-05-08 NOTE — Telephone Encounter (Signed)
Pt need a prescription for her Eliquis. Please call or send to [facility].

## 2014-05-08 NOTE — Telephone Encounter (Signed)
Patient had questions about whether or not she would need a cell phone to be able to use the cell adaptor. I explained to her that the cell adaptor works off of the cell towers in the area and not her cell phone specifically. Patient voiced understanding. She stated that she had not yet received the adaptor. [facility] was checked---adaptor was ordered on 11-25 and will be sent to address in Epic.

## 2014-05-08 NOTE — Telephone Encounter (Signed)
Patient requested refill of Eliquis 2.5mg  to be sent to [facility]. RX reordered for 180 tabs and 3 RF. Patient states that she has been out of her Eliquis x 2 weeks. I offered to send an RX to the local pharm. Patient declined offer. I asked if she could come by to get samples if we have them, but again she declined.

## 2014-05-09 ENCOUNTER — Telehealth: Payer: Self-pay | Admitting: Family Medicine

## 2014-05-09 NOTE — Telephone Encounter (Signed)
Patient says she has been taking two of the 5 mg pills and still not sleeping well.  She would like a stronger dose please.

## 2014-05-09 NOTE — Telephone Encounter (Signed)
We need to be sure she is taking the maximum 20 mg dose. I believe we started on a low-dose initially

## 2014-05-09 NOTE — Telephone Encounter (Signed)
I had earlier suggested increasing Belsomra to 20 mg at bedtime

## 2014-05-10 ENCOUNTER — Other Ambulatory Visit: Payer: Self-pay | Admitting: Family Medicine

## 2014-05-10 MED ORDER — SUVOREXANT 20 MG PO TABS: 20.0000 mg | ORAL_TABLET | Freq: Every day | ORAL | Status: DC

## 2014-05-10 NOTE — Telephone Encounter (Signed)
Script called in for belsomra 20 mg, take hs,qty30 per Dr. Sabra Heck.   Pharmacy says will need to order and it will be in stock tomorrow. They will inform patient.

## 2014-05-11 ENCOUNTER — Encounter: Payer: Self-pay | Admitting: Gastroenterology

## 2014-05-13 ENCOUNTER — Telehealth: Payer: Self-pay | Admitting: Physician Assistant

## 2014-05-13 NOTE — Telephone Encounter (Signed)
    Returned after hours call about ankle swelling. No answer. I tried back twice and left a message to call us back if she continues to have problems.  Angelena Form PA-C  [facility]

## 2014-05-13 NOTE — Telephone Encounter (Signed)
    Was paged again about swelling but phone number provided goes straight to VM. I have called multiple times.   Angelena Form PA-C  [facility]

## 2014-05-15 ENCOUNTER — Telehealth: Payer: Self-pay | Admitting: Cardiovascular Disease

## 2014-05-15 MED ORDER — METOLAZONE 2.5 MG PO TABS: ORAL_TABLET | ORAL | Status: DC

## 2014-05-15 NOTE — Telephone Encounter (Signed)
Spoke with pt, aware of dr croitoru's recommendations. New script sent to the pharm, Follow up scheduled

## 2014-05-15 NOTE — Telephone Encounter (Signed)
Please call asap,ankle and feet are swollen so much.

## 2014-05-15 NOTE — Telephone Encounter (Signed)
Spoke with pt, she has been taking furosemide 60 mg bid for the last 3 days (including today), and there has been no change in her edema. She is unable to get her shoes on. She is SOB but states there is no increase from her usual. She is up about 7 lbs and she is unable to lie flat to sleep. Will forward for dr croitoru's review

## 2014-05-15 NOTE — Telephone Encounter (Signed)
Add zaroxylyn 2.5 mg once daily in AM, 30 min before furosemide. Office visit before end of week. BMET in 2-3 days please

## 2014-05-15 NOTE — Addendum Note (Signed)
Addended by: Cristopher Estimable on: 05/15/2014 02:44 PM   Modules accepted: Orders

## 2014-05-17 ENCOUNTER — Ambulatory Visit: Payer: Self-pay | Admitting: Cardiovascular Disease

## 2014-05-17 ENCOUNTER — Telehealth: Payer: Self-pay | Admitting: Family Medicine

## 2014-05-18 ENCOUNTER — Encounter: Payer: Medicare Other | Admitting: *Deleted

## 2014-05-18 ENCOUNTER — Telehealth: Payer: Self-pay | Admitting: Cardiology

## 2014-05-18 NOTE — Telephone Encounter (Signed)
LMOVM reminding pt to send remote transmission.   

## 2014-05-19 ENCOUNTER — Other Ambulatory Visit: Payer: Self-pay | Admitting: Cardiovascular Disease

## 2014-05-19 MED ORDER — APIXABAN 2.5 MG PO TABS: ORAL_TABLET | ORAL | Status: DC

## 2014-05-19 NOTE — Telephone Encounter (Signed)
Pt need a new prescription for Eliquis #90 and refills. Please call to [facility].

## 2014-05-19 NOTE — Telephone Encounter (Signed)
Refill for Eliquis e-scribed to [facility].

## 2014-05-20 NOTE — Progress Notes (Signed)
Erroneous encounter

## 2014-05-22 ENCOUNTER — Telehealth: Payer: Self-pay | Admitting: Family Medicine

## 2014-05-22 NOTE — Telephone Encounter (Signed)
Last filled 04/24/14, last seen 04/10/14. Rx will print

## 2014-05-23 ENCOUNTER — Other Ambulatory Visit: Payer: Self-pay | Admitting: Family Medicine

## 2014-05-23 ENCOUNTER — Encounter: Payer: Self-pay | Admitting: *Deleted

## 2014-05-23 MED ORDER — HYDROCODONE-ACETAMINOPHEN 5-325 MG PO TABS: 1.0000 | ORAL_TABLET | Freq: Four times a day (QID) | ORAL | Status: DC | PRN

## 2014-05-23 NOTE — Telephone Encounter (Signed)
Discussed with Dr Laurance Flatten. Prescription printed and signed. Patient aware.

## 2014-05-23 NOTE — Telephone Encounter (Signed)
Patient was seen by Dr Sabra Heck on 04/25/14 and given hydrocodone for chronic LBP due to severe scoliosis and DDD.  She has been out of pain medication since yesterday and he will not be in the office until 05/25/14. Can you authorize a refill on her hydrocodone? The prescription has been setup. If approved please route to Pool to call patient.

## 2014-05-23 NOTE — Telephone Encounter (Signed)
Dr. Sabra Heck is rx her pain meds

## 2014-05-23 NOTE — Telephone Encounter (Signed)
I sent this to Ruta Hinds, he is the only other one to see her. Sabra Heck will not be here until Thursday?

## 2014-05-24 ENCOUNTER — Encounter: Payer: Self-pay | Admitting: *Deleted

## 2014-05-25 MED ORDER — ESZOPICLONE 2 MG PO TABS: 2.0000 mg | ORAL_TABLET | Freq: Every evening | ORAL | Status: DC | PRN

## 2014-05-25 NOTE — Telephone Encounter (Signed)
We are running out of options for him not aches I do not think she has tried Lunesta 2 mg we could call in 12 as a trial

## 2014-05-25 NOTE — Telephone Encounter (Signed)
Pt has multiple calls, will discuss this with other encounter and close this encounter.

## 2014-05-26 ENCOUNTER — Inpatient Hospital Stay (HOSPITAL_COMMUNITY)
Admission: EM | Admit: 2014-05-26 | Discharge: 2014-05-30 | DRG: 683 | Disposition: A | Discharge status: Home Health | Payer: Medicare Other | Attending: Internal Medicine | Admitting: Internal Medicine

## 2014-05-26 ENCOUNTER — Emergency Department (HOSPITAL_COMMUNITY): Payer: Medicare Other

## 2014-05-26 ENCOUNTER — Telehealth: Payer: Self-pay | Admitting: Nurse Practitioner

## 2014-05-26 ENCOUNTER — Encounter (HOSPITAL_COMMUNITY): Payer: Self-pay | Admitting: Emergency Medicine

## 2014-05-26 DIAGNOSIS — Z7901 Long term (current) use of anticoagulants: Secondary | ICD-10-CM

## 2014-05-26 DIAGNOSIS — F319 Bipolar disorder, unspecified: Secondary | ICD-10-CM | POA: Diagnosis present

## 2014-05-26 DIAGNOSIS — R51 Headache: Secondary | ICD-10-CM | POA: Diagnosis not present

## 2014-05-26 DIAGNOSIS — J019 Acute sinusitis, unspecified: Secondary | ICD-10-CM | POA: Diagnosis present

## 2014-05-26 DIAGNOSIS — I129 Hypertensive chronic kidney disease with stage 1 through stage 4 chronic kidney disease, or unspecified chronic kidney disease: Secondary | ICD-10-CM | POA: Diagnosis not present

## 2014-05-26 DIAGNOSIS — Z880 Allergy status to penicillin: Secondary | ICD-10-CM | POA: Diagnosis not present

## 2014-05-26 DIAGNOSIS — Z66 Do not resuscitate: Secondary | ICD-10-CM | POA: Diagnosis not present

## 2014-05-26 DIAGNOSIS — Z950 Presence of cardiac pacemaker: Secondary | ICD-10-CM | POA: Diagnosis not present

## 2014-05-26 DIAGNOSIS — Z809 Family history of malignant neoplasm, unspecified: Secondary | ICD-10-CM | POA: Diagnosis not present

## 2014-05-26 DIAGNOSIS — R531 Weakness: Secondary | ICD-10-CM | POA: Diagnosis not present

## 2014-05-26 DIAGNOSIS — T1490 Injury, unspecified: Secondary | ICD-10-CM | POA: Diagnosis not present

## 2014-05-26 DIAGNOSIS — R519 Headache, unspecified: Secondary | ICD-10-CM

## 2014-05-26 DIAGNOSIS — S0990XA Unspecified injury of head, initial encounter: Secondary | ICD-10-CM | POA: Diagnosis not present

## 2014-05-26 DIAGNOSIS — W19XXXA Unspecified fall, initial encounter: Secondary | ICD-10-CM | POA: Diagnosis not present

## 2014-05-26 DIAGNOSIS — Z8249 Family history of ischemic heart disease and other diseases of the circulatory system: Secondary | ICD-10-CM | POA: Diagnosis not present

## 2014-05-26 DIAGNOSIS — Z888 Allergy status to other drugs, medicaments and biological substances status: Secondary | ICD-10-CM

## 2014-05-26 DIAGNOSIS — Z882 Allergy status to sulfonamides status: Secondary | ICD-10-CM | POA: Diagnosis not present

## 2014-05-26 DIAGNOSIS — Y92099 Unspecified place in other non-institutional residence as the place of occurrence of the external cause: Secondary | ICD-10-CM | POA: Diagnosis not present

## 2014-05-26 DIAGNOSIS — Z87891 Personal history of nicotine dependence: Secondary | ICD-10-CM

## 2014-05-26 DIAGNOSIS — I4891 Unspecified atrial fibrillation: Secondary | ICD-10-CM | POA: Diagnosis not present

## 2014-05-26 DIAGNOSIS — I5042 Chronic combined systolic (congestive) and diastolic (congestive) heart failure: Secondary | ICD-10-CM | POA: Diagnosis present

## 2014-05-26 DIAGNOSIS — Z825 Family history of asthma and other chronic lower respiratory diseases: Secondary | ICD-10-CM

## 2014-05-26 DIAGNOSIS — R404 Transient alteration of awareness: Secondary | ICD-10-CM | POA: Diagnosis not present

## 2014-05-26 DIAGNOSIS — N179 Acute kidney failure, unspecified: Secondary | ICD-10-CM | POA: Diagnosis not present

## 2014-05-26 DIAGNOSIS — N189 Chronic kidney disease, unspecified: Secondary | ICD-10-CM | POA: Diagnosis not present

## 2014-05-26 DIAGNOSIS — N183 Chronic kidney disease, stage 3 (moderate): Secondary | ICD-10-CM | POA: Diagnosis not present

## 2014-05-26 DIAGNOSIS — I10 Essential (primary) hypertension: Secondary | ICD-10-CM | POA: Diagnosis not present

## 2014-05-26 DIAGNOSIS — Y92009 Unspecified place in unspecified non-institutional (private) residence as the place of occurrence of the external cause: Secondary | ICD-10-CM

## 2014-05-26 DIAGNOSIS — Z8659 Personal history of other mental and behavioral disorders: Secondary | ICD-10-CM

## 2014-05-26 DIAGNOSIS — E876 Hypokalemia: Secondary | ICD-10-CM | POA: Diagnosis not present

## 2014-05-26 LAB — URINE MICROSCOPIC-ADD ON

## 2014-05-26 LAB — CBC
HEMATOCRIT: 35.1 % — AB (ref 36.0–46.0)
Hemoglobin: 12 g/dL (ref 12.0–15.0)
MCH: 30.7 pg (ref 26.0–34.0)
MCHC: 34.2 g/dL (ref 30.0–36.0)
MCV: 89.8 fL (ref 78.0–100.0)
Platelets: 272 10*3/uL (ref 150–400)
RBC: 3.91 MIL/uL (ref 3.87–5.11)
RDW: 19.6 % — ABNORMAL HIGH (ref 11.5–15.5)
WBC: 7.6 10*3/uL (ref 4.0–10.5)

## 2014-05-26 LAB — URINALYSIS, ROUTINE W REFLEX MICROSCOPIC
Bilirubin Urine: NEGATIVE
GLUCOSE, UA: NEGATIVE mg/dL
Ketones, ur: NEGATIVE mg/dL
Nitrite: NEGATIVE
PH: 6 (ref 5.0–8.0)
Protein, ur: NEGATIVE mg/dL
SPECIFIC GRAVITY, URINE: 1.01 (ref 1.005–1.030)
Urobilinogen, UA: 0.2 mg/dL (ref 0.0–1.0)

## 2014-05-26 LAB — BASIC METABOLIC PANEL
ANION GAP: 18 — AB (ref 5–15)
Anion gap: 16 — ABNORMAL HIGH (ref 5–15)
BUN: 88 mg/dL — ABNORMAL HIGH (ref 6–23)
BUN: 86 mg/dL — ABNORMAL HIGH (ref 6–23)
CALCIUM: 9.2 mg/dL (ref 8.4–10.5)
CHLORIDE: 81 mmol/L — AB (ref 96–112)
CO2: 35 mmol/L — ABNORMAL HIGH (ref 19–32)
CO2: 34 mmol/L — ABNORMAL HIGH (ref 19–32)
Calcium: 9.8 mg/dL (ref 8.4–10.5)
Chloride: 78 mmol/L — ABNORMAL LOW (ref 96–112)
Creatinine, Ser: 2.2 mg/dL — ABNORMAL HIGH (ref 0.50–1.10)
Creatinine, Ser: 2.09 mg/dL — ABNORMAL HIGH (ref 0.50–1.10)
GFR calc Af Amer: 22 mL/min — ABNORMAL LOW (ref >90)
GFR calc Af Amer: 24 mL/min — ABNORMAL LOW (ref >90)
GFR calc non Af Amer: 19 mL/min — ABNORMAL LOW (ref >90)
GFR calc non Af Amer: 20 mL/min — ABNORMAL LOW (ref >90)
Glucose, Bld: 112 mg/dL — ABNORMAL HIGH (ref 70–99)
Glucose, Bld: 113 mg/dL — ABNORMAL HIGH (ref 70–99)
POTASSIUM: 2 mmol/L — AB (ref 3.5–5.1)
SODIUM: 131 mmol/L — AB (ref 135–145)
Sodium: 131 mmol/L — ABNORMAL LOW (ref 135–145)

## 2014-05-26 MED ORDER — ZOLPIDEM TARTRATE 5 MG PO TABS
5.0000 mg | ORAL_TABLET | Freq: Every evening | ORAL | Status: DC | PRN
  Administered 2014-05-26 – 2014-05-29 (×4): 5 mg via ORAL
  Filled 2014-05-26 (×4): qty 1

## 2014-05-26 MED ORDER — APIXABAN 5 MG PO TABS
2.5000 mg | ORAL_TABLET | Freq: Two times a day (BID) | ORAL | Status: DC
  Administered 2014-05-26 – 2014-05-30 (×8): 2.5 mg via ORAL
  Filled 2014-05-26 (×8): qty 1

## 2014-05-26 MED ORDER — POTASSIUM CHLORIDE 10 MEQ/100ML IV SOLN
10.0000 meq | Freq: Once | INTRAVENOUS | Status: AC
  Administered 2014-05-26: 10 meq via INTRAVENOUS
  Filled 2014-05-26: qty 100

## 2014-05-26 MED ORDER — POTASSIUM CHLORIDE 10 MEQ/100ML IV SOLN
10.0000 meq | INTRAVENOUS | Status: AC
  Administered 2014-05-26 (×3): 10 meq via INTRAVENOUS
  Filled 2014-05-26 (×2): qty 100

## 2014-05-26 MED ORDER — IBUPROFEN 800 MG PO TABS
800.0000 mg | ORAL_TABLET | Freq: Once | ORAL | Status: AC
  Administered 2014-05-26: 800 mg via ORAL
  Filled 2014-05-26: qty 1

## 2014-05-26 MED ORDER — PANTOPRAZOLE SODIUM 40 MG PO TBEC
80.0000 mg | DELAYED_RELEASE_TABLET | Freq: Every day | ORAL | Status: DC
  Administered 2014-05-26 – 2014-05-30 (×5): 80 mg via ORAL
  Filled 2014-05-26 (×5): qty 2

## 2014-05-26 MED ORDER — POTASSIUM CHLORIDE 20 MEQ/15ML (10%) PO SOLN
40.0000 meq | Freq: Three times a day (TID) | ORAL | Status: AC
  Administered 2014-05-26 – 2014-05-27 (×3): 40 meq via ORAL
  Filled 2014-05-26 (×3): qty 30

## 2014-05-26 MED ORDER — DOCUSATE SODIUM 100 MG PO CAPS
100.0000 mg | ORAL_CAPSULE | Freq: Every day | ORAL | Status: DC | PRN
  Administered 2014-05-27 – 2014-05-28 (×2): 100 mg via ORAL
  Filled 2014-05-26 (×2): qty 1

## 2014-05-26 MED ORDER — ONDANSETRON HCL 4 MG PO TABS: 4.0000 mg | ORAL_TABLET | Freq: Four times a day (QID) | ORAL | Status: DC | PRN

## 2014-05-26 MED ORDER — ONDANSETRON HCL 4 MG/2ML IJ SOLN: 4.0000 mg | Freq: Four times a day (QID) | INTRAMUSCULAR | Status: DC | PRN

## 2014-05-26 MED ORDER — CARVEDILOL 3.125 MG PO TABS
6.2500 mg | ORAL_TABLET | Freq: Two times a day (BID) | ORAL | Status: DC
  Administered 2014-05-26 – 2014-05-30 (×8): 6.25 mg via ORAL
  Filled 2014-05-26 (×8): qty 2

## 2014-05-26 MED ORDER — PAROXETINE HCL 20 MG PO TABS
40.0000 mg | ORAL_TABLET | Freq: Every morning | ORAL | Status: DC
  Administered 2014-05-27 – 2014-05-30 (×4): 40 mg via ORAL
  Filled 2014-05-26 (×4): qty 2

## 2014-05-26 MED ORDER — ACETAMINOPHEN 650 MG RE SUPP: 650.0000 mg | Freq: Four times a day (QID) | RECTAL | Status: DC | PRN

## 2014-05-26 MED ORDER — SODIUM CHLORIDE 0.9 % IV BOLUS (SEPSIS)
500.0000 mL | Freq: Once | INTRAVENOUS | Status: AC
  Administered 2014-05-26: 500 mL via INTRAVENOUS

## 2014-05-26 MED ORDER — HYDROCODONE-ACETAMINOPHEN 5-325 MG PO TABS
1.0000 | ORAL_TABLET | Freq: Four times a day (QID) | ORAL | Status: DC | PRN
  Administered 2014-05-26 – 2014-05-29 (×9): 1 via ORAL
  Filled 2014-05-26 (×9): qty 1

## 2014-05-26 MED ORDER — MAGNESIUM SULFATE 2 GM/50ML IV SOLN
2.0000 g | INTRAVENOUS | Status: AC
  Administered 2014-05-26: 2 g via INTRAVENOUS
  Filled 2014-05-26: qty 50

## 2014-05-26 MED ORDER — ACETAMINOPHEN 325 MG PO TABS
650.0000 mg | ORAL_TABLET | Freq: Four times a day (QID) | ORAL | Status: DC | PRN
  Administered 2014-05-27 – 2014-05-29 (×3): 650 mg via ORAL
  Filled 2014-05-26 (×4): qty 2

## 2014-05-26 MED ORDER — SODIUM CHLORIDE 0.9 % IJ SOLN
3.0000 mL | Freq: Two times a day (BID) | INTRAMUSCULAR | Status: DC
  Administered 2014-05-27 – 2014-05-29 (×4): 3 mL via INTRAVENOUS

## 2014-05-26 MED ORDER — ALUM & MAG HYDROXIDE-SIMETH 200-200-20 MG/5ML PO SUSP: 30.0000 mL | Freq: Four times a day (QID) | ORAL | Status: DC | PRN

## 2014-05-26 MED ORDER — SODIUM CHLORIDE 0.9 % IV SOLN
INTRAVENOUS | Status: AC
  Administered 2014-05-26 – 2014-05-28 (×4): via INTRAVENOUS

## 2014-05-26 MED ORDER — ZOLPIDEM TARTRATE 5 MG PO TABS: 5.0000 mg | ORAL_TABLET | Freq: Every evening | ORAL | Status: DC | PRN

## 2014-05-26 NOTE — ED Notes (Signed)
Report given to Lovena Le, RN for room 311.

## 2014-05-26 NOTE — ED Notes (Signed)
MD at bedside. 

## 2014-05-26 NOTE — Telephone Encounter (Signed)
Patient has called with "just don't feel well". Her arms are hurting. She has had a recent fall as well. Repeatedly says "I just don't feel good".   She does not have a way to check her BP.  Has no family that can check on her.   Informed her that all the offices are closed and that trying to figure out what is wrong over the phone will not be sufficient. Also informed that if she is seeking treatment that she would need to come to the ER. Would recommend calling EMS for transport.  Patient is agreeable.   Burtis Junes, RN, [facility] [place] [address] [place], [place]  [id number] [phone]

## 2014-05-26 NOTE — H&P (Signed)
Kimberly [name] and Physical  SOPHEY [name] [id number] DOB: October 27, 1926 DOA: 05/26/2014  Referring physician: Dr Sabra Heck, ED physician PCP: Wardell Honour, MD   Chief Complaint: Fall  HPI: Kimberly [name] is a 81 y.o. female  With a Kimberly [name] of atrial fibrillation with biventricular cardiac pacemaker, chronic anticoagulation with Eliquis, hypertension, stage III chronic kidney disease, combined systolic and diastolic CHF (NYHA class IV). Patient's had a fall at home where she developed a sudden onset of lightheadedness. She did not strike her head, but due to difficulty in getting up, she presented to the hospital for evaluation. Other than general soreness, she is asymptomatic.   Review of Systems:   Pt complains of back pain, leg pain - both of which are chronic.[name]  Pt denies any fevers, chills, vertigo, vision changes, chest pain, shortness of breath, generalized weakness, abdominal pain, nausea, vomiting, diarrhea, dysuria.  Review of systems are otherwise negative  Past Medical Kimberly [name]  Diagnosis Date  . Depression   . Atrial fibrillation   . Scoliosis   . Osteoarthritis   . Pacemaker     Last saw cards 07/2013  . Fibromyalgia   . Hypertension   . Glaucoma   . Acute diverticulitis 08/24/2013  . CKD (chronic kidney disease) stage 3, GFR 30-59 ml/min 10/12/2012  . Chronic anticoagulation 10/12/2012  . Acute blood loss anemia 10/11/2012  . Antral ulcer 10/11/2012  . Erosive esophagitis 10/11/2012  . Cardiomyopathy, nonischemic   . Acute on chronic combined systolic and diastolic CHF, NYHA class 4 11/15/2013  . Arrhythmia     atrial fibb  . H/O echocardiogram 2007    EF 40-45%,          Past Surgical Kimberly [name]  Procedure Laterality Date  . Pacemaker insertion    . Abdominal hysterectomy    . Cholecystectomy    . Appendectomy    . Breast surgery    . Back surgery    . Neck surgery    . Hernia repair      right inguinal hernia and umbilical  . Tonsillectomy    .  Esophagogastroduodenoscopy N/A 10/13/2012    Procedure: ESOPHAGOGASTRODUODENOSCOPY (EGD);  Surgeon: Daneil Dolin, MD;  Location: [facility];  Service: Endoscopy;  Laterality: N/A;  . Cystoscopy N/A 02/24/2013    Procedure: CYSTOSCOPY WITH URETHRAL DILITATION;  Surgeon: Marissa Nestle, MD;  Location: [facility] ORS;  Service: Urology;  Laterality: N/A;  . Doppler echocardiography N/A 05/30/2010    LV SIZE IS NORMAL. LV SYSTOLIC FUNCTION IS LOW NORMAL. EF=50-55%. MILD INFERIOR HYPOKINESIS.MILD TO MODERATE POSTERIOR WALL HYPOKINESIS.PACEMAKER LEAD IN THE RV. LA IS MILDLY DILATED. RA IS MODERATE TO SEVERLY DILATED. PACEMAKER LEAD IN THE RA. MILD CALCICICATION OF THE MV APPARATUS. MODERATE MR. MILD TO MODERATE TR. MILD PHTN.AV MILDLY SCLEROTIC.  . Nuclear stress test N/A 02/13/2009    NORMAL PATTERN OF PERFUSION IN ALL REGIONS. POST STRESS VENTICULAR SIZE IS NORMAL. POST  STESS EF 85%.  NORMAL MYOCARDIAL PERFUSION STUDY.  . Cardiac catheterization  12/08/2005    LAD AND LEFT MAIN WITH NO HIGH-GRADE STENOSIS. MILD DISEASE IN THE CX AND LAD SYSTEM. SEVERE LV DYSFUNCTION WITH DILATION OF THE LV. EF 15-20%. LV END-DIASTOLIC PRESSURE IS 90. +1 MR.  Fawn Kirk ext venous Bilateral 11-08-10    R & L- NO EVIDENCE OF THROMBUS OR THROMBOPHLEBITIS. THERE IS MILD AMOUNT OF SUBCUTANEOUS EDEMA NOTED WITHIN THE LEFT CALF AND ANKLE. R & L GSV AND SSV- NO VENOUS INSUFF NOTED.   Social  Kimberly [name]:  reports that she quit smoking about 9 years ago. Her smoking use included Cigarettes. She has a 15 pack-year smoking Kimberly [name]. She does not have any smokeless tobacco Kimberly [name] on file. She reports that she drinks alcohol. She reports that she does not use illicit drugs. Patient lives at home & is able to participate in activities of daily living  Allergies  Allergen Reactions  . Iron Swelling    Ferrous Sulfate - tongue swelling   . Iodine Rash and Other (See Comments)    REACTION:If injected,  Rash/irritated skin reaction "welts"  .  Penicillins Hives  . Sulfa Antibiotics Rash    Family Kimberly [name]  Problem Relation Age of Onset  . Cancer Sister   . Asthma Sister   . Heart failure Brother   . Colon cancer Neg Hx       Prior to Admission medications   Medication Sig Start Date End Date Taking? Authorizing Provider  apixaban (ELIQUIS) 2.5 MG TABS tablet Take 1 tablet by mouth  twice a day Patient taking differently: Take 2.5 mg by mouth 2 (two) times daily.  05/19/14  Yes Mihai Croitoru, MD  carvedilol (COREG) 6.25 MG tablet Take 1 tablet (6.25 mg total) by mouth 2 (two) times daily with a meal. 12/01/13  Yes Mihai Croitoru, MD  eszopiclone (LUNESTA) 2 MG TABS tablet Take 1 tablet (2 mg total) by mouth at bedtime as needed for sleep. Take immediately before bedtime 05/25/14  Yes Wardell Honour, MD  Ferrous Sulfate 27 MG TABS Take 1 tablet by mouth daily.   Yes Historical Provider, MD  furosemide (LASIX) 20 MG tablet Take 3 tablets (60 mg total) by mouth daily. 11/15/13  Yes Mihai Croitoru, MD  HYDROcodone-acetaminophen (NORCO/VICODIN) 5-325 MG per tablet Take 1-2 tablets by mouth every 6 (six) hours as needed for moderate pain. 05/23/14  Yes Chipper Herb, MD  metolazone (ZAROXOLYN) 2.5 MG tablet Take one tablet once daily, 30 min prior to furosemide 05/15/14  Yes Mihai Croitoru, MD  omeprazole (PRILOSEC) 20 MG capsule Take 20 mg by mouth daily.   Yes Historical Provider, MD  PARoxetine (PAXIL) 40 MG tablet Take 40 mg by mouth every morning.  11/17/13  Yes Historical Provider, MD  potassium chloride SA (K-DUR,KLOR-CON) 20 MEQ tablet Take 1 tablet (20 mEq total) by mouth 2 (two) times daily. 05/03/14  Yes Wardell Honour, MD    Physical Exam: BP 138/67 mmHg  Pulse 73  Temp(Src) 97.5 F (36.4 C) (Oral)  Resp 13  Ht 5\' 5"  (1.651 m)  Wt 61.236 kg (135 lb)  BMI 22.47 kg/m2  SpO2 100%  General: Elderly Caucasian female. Awake and alert and oriented x3. No acute cardiopulmonary distress.  Eyes: Pupils equal, round,  reactive to light. Extraocular muscles are intact. Sclerae anicteric and noninjected.  ENT: Moist mucosal membranes. No mucosal lesions.   Neck: Neck supple without lymphadenopathy. No carotid bruits. No masses palpated.  Cardiovascular: Regular rate with normal S1-S2 sounds. No murmurs, rubs, gallops auscultated. No JVD.  Respiratory: Good respiratory effort with no wheezes, rales, rhonchi. Lungs clear to auscultation bilaterally.  Abdomen: Soft, nontender, nondistended. Active bowel sounds. No masses or hepatosplenomegaly  Skin: Dry, warm to touch. 2+ dorsalis pedis and radial pulses. Multiple small bruises scattered throughout Musculoskeletal: No calf or leg pain. All major joints not erythematous nontender.  Psychiatric: Intact judgment and insight.  Neurologic: No focal neurological deficits. Cranial nerves II through XII are grossly intact.  Labs on Admission:  Basic Metabolic Panel:  Recent Labs Lab 05/26/14 1415 05/26/14 1521  NA 131* 131*  K 2.0* <2.0*  CL 78* 81*  CO2 35* 34*  GLUCOSE 112* 113*  BUN 88* 86*  CREATININE 2.20* 2.09*  CALCIUM 9.8 9.2   Liver Function Tests: No results for input(s): AST, ALT, ALKPHOS, BILITOT, PROT, ALBUMIN in the last 168 hours. No results for input(s): LIPASE, AMYLASE in the last 168 hours. No results for input(s): AMMONIA in the last 168 hours. CBC:  Recent Labs Lab 05/26/14 1415  WBC 7.6  HGB 12.0  HCT 35.1*  MCV 89.8  PLT 272   Cardiac Enzymes: No results for input(s): CKTOTAL, CKMB, CKMBINDEX, TROPONINI in the last 168 hours.  BNP (last 3 results) No results for input(s): PROBNP in the last 8760 hours. CBG: No results for input(s): GLUCAP in the last 168 hours.  Radiological Exams on Admission: Ct Head Wo Contrast  05/26/2014   CLINICAL DATA:  [place] earlier today, did not strike head but has a headache, past Kimberly [name] hypertension, atrial fibrillation  EXAM: CT HEAD WITHOUT CONTRAST  TECHNIQUE: Contiguous axial  images were obtained from the base of the skull through the vertex without intravenous contrast.  COMPARISON:  11/27/2011  FINDINGS: Mild atrophy.  Normal ventricular morphology.  No midline shift or mass effect.  Normal appearance of brain parenchyma.  No intracranial hemorrhage, mass lesion, or evidence acute infarction.  No extra-axial fluid collections.  Atherosclerotic calcifications at carotid siphons.  Bones is sinuses unremarkable.  IMPRESSION: No acute intracranial abnormalities.   Electronically Signed   By: Lavonia Dana M.D.   On: 05/26/2014 15:13    EKG: Independently reviewed. Ventricularly paced rhythm at a rate of 71 bpm  Assessment/Plan Present on Admission:  . Hypokalemia . Acute on chronic renal failure  #1 hypokalemia #2 acute on chronic renal failure #3 fall #4 atrial fibrillation #5 biventricular cardiac pacemaker #6 hypertension #7 systolic and diastolic heart failure he  Admit for observation Replace potassium - both IV and oral Gentle hydration overnight Hold diuretics Recheck labs in the morning Home medications otherwise  DVT prophylaxis: Eliquis  Consultants: None  Code Status: DO NOT RESUSCITATE  Family Communication: None   Disposition Plan: Home following correction of electrolyte dysfunction   Loma Boston, DO [facility] Pager [phone]

## 2014-05-26 NOTE — ED Provider Notes (Addendum)
CSN: [id number]     Arrival date & time 05/26/14  1350 History   This chart was scribed for Kimberly Acosta, MD by Edison Simon, ED Scribe. This patient was seen in room APA15/APA15 and the patient's care was started at 1:55 PM.    Chief Complaint  Patient presents with  . Headache   The history is provided by the patient. No language interpreter was used.    Symptoms were acute in onset Symptoms are persistent Symptoms are improved with nothing Made worse with nothing Associated symptoms include SOB, nausea, abdominal pain   HPI Comments: Kimberly [name] is a 81 y.o. female who presents to the Emergency Department complaining of headache and otherwise "hurting all over." She reports associated SOB, nausea, and abdominal pain. But states that these are frequent complaints and not new to today.  She states she rarely gets headaches. She also notes she fell earlier today and EMS came and helped her up, then returned here for evaluation of headache. She denies striking her head. She denies history of problems with falling or passing out. She is unsure why she fell, she denies tripping. She notes leg swelling for which she is taking medications. She notes she has a pacemaker. She states she took 1 pain pill this morning. She denies fever, sore throat, sinus congestion, sinus pressure, dysuria, or diarrhea.   Past Medical History  Diagnosis Date  . Depression   . Atrial fibrillation   . Scoliosis   . Osteoarthritis   . Pacemaker     Last saw cards 07/2013  . Fibromyalgia   . Hypertension   . Glaucoma   . Acute diverticulitis 08/24/2013  . CKD (chronic kidney disease) stage 3, GFR 30-59 ml/min 10/12/2012  . Chronic anticoagulation 10/12/2012  . Acute blood loss anemia 10/11/2012  . Antral ulcer 10/11/2012  . Erosive esophagitis 10/11/2012  . Cardiomyopathy, nonischemic   . Acute on chronic combined systolic and diastolic CHF, NYHA class 4 11/15/2013  . Arrhythmia     atrial fibb  . H/O  echocardiogram 2007    EF 40-45%,          Past Surgical History  Procedure Laterality Date  . Pacemaker insertion    . Abdominal hysterectomy    . Cholecystectomy    . Appendectomy    . Breast surgery    . Back surgery    . Neck surgery    . Hernia repair      right inguinal hernia and umbilical  . Tonsillectomy    . Esophagogastroduodenoscopy N/A 10/13/2012    Procedure: ESOPHAGOGASTRODUODENOSCOPY (EGD);  Surgeon: Daneil Dolin, MD;  Location: [facility];  Service: Endoscopy;  Laterality: N/A;  . Cystoscopy N/A 02/24/2013    Procedure: CYSTOSCOPY WITH URETHRAL DILITATION;  Surgeon: Marissa Nestle, MD;  Location: [facility];  Service: Urology;  Laterality: N/A;  . Doppler echocardiography N/A 05/30/2010    LV SIZE IS NORMAL. LV SYSTOLIC FUNCTION IS LOW NORMAL. EF=50-55%. MILD INFERIOR HYPOKINESIS.MILD TO MODERATE POSTERIOR WALL HYPOKINESIS.PACEMAKER LEAD IN THE RV. LA IS MILDLY DILATED. RA IS MODERATE TO SEVERLY DILATED. PACEMAKER LEAD IN THE RA. MILD CALCICICATION OF THE MV APPARATUS. MODERATE MR. MILD TO MODERATE TR. MILD PHTN.AV MILDLY SCLEROTIC.  . Nuclear stress test N/A 02/13/2009    NORMAL PATTERN OF PERFUSION IN ALL REGIONS. POST STRESS VENTICULAR SIZE IS NORMAL. POST  STESS EF 85%.  NORMAL MYOCARDIAL PERFUSION STUDY.  . Cardiac catheterization  12/08/2005    LAD AND LEFT  MAIN WITH NO HIGH-GRADE STENOSIS. MILD DISEASE IN THE CX AND LAD SYSTEM. SEVERE LV DYSFUNCTION WITH DILATION OF THE LV. EF 15-20%. LV END-DIASTOLIC PRESSURE IS 90. +1 MR.  Fawn Kirk ext venous Bilateral 11-08-10    R & L- NO EVIDENCE OF THROMBUS OR THROMBOPHLEBITIS. THERE IS MILD AMOUNT OF SUBCUTANEOUS EDEMA NOTED WITHIN THE LEFT CALF AND ANKLE. R & L GSV AND SSV- NO VENOUS INSUFF NOTED.   Family History  Problem Relation Age of Onset  . Cancer Sister   . Asthma Sister   . Heart failure Brother   . Colon cancer Neg Hx    History  Substance Use Topics  . Smoking status: Former Smoker -- 0.50 packs/day for 30  years    Types: Cigarettes    Quit date: 12/13/2004  . Smokeless tobacco: Not on file  . Alcohol Use: 0.0 oz/week    0 Glasses of wine per week     Comment: history of drinking a glass of wine in the evening   OB History    Gravida Para Term Preterm AB TAB SAB Ectopic Multiple Living   5 2 1 1 3  3   2      Review of Systems  Constitutional: Negative for fever.  HENT: Negative for congestion, rhinorrhea, sinus pressure and sore throat.   Respiratory: Positive for shortness of breath.   Cardiovascular: Positive for leg swelling.  Gastrointestinal: Positive for nausea and abdominal pain. Negative for diarrhea.  Genitourinary: Negative for dysuria.  Musculoskeletal: Positive for myalgias.  Neurological: Positive for headaches.  All other systems reviewed and are negative.     Allergies  Iron; Iodine; Penicillins; and Sulfa antibiotics  Home Medications   Prior to Admission medications   Medication Sig Start Date End Date Taking? Authorizing Provider  apixaban (ELIQUIS) 2.5 MG TABS tablet Take 1 tablet by mouth  twice a day 05/19/14   Sanda Klein, MD  carvedilol (COREG) 6.25 MG tablet Take 1 tablet (6.25 mg total) by mouth 2 (two) times daily with a meal. 12/01/13   Mihai Croitoru, MD  eszopiclone (LUNESTA) 2 MG TABS tablet Take 1 tablet (2 mg total) by mouth at bedtime as needed for sleep. Take immediately before bedtime 05/25/14   Wardell Honour, MD  furosemide (LASIX) 20 MG tablet Take 3 tablets (60 mg total) by mouth daily. 11/15/13   Mihai Croitoru, MD  HYDROcodone-acetaminophen (NORCO/VICODIN) 5-325 MG per tablet Take 1-2 tablets by mouth every 6 (six) hours as needed for moderate pain. 05/23/14   Chipper Herb, MD  metolazone (ZAROXOLYN) 2.5 MG tablet Take one tablet once daily, 30 min prior to furosemide 05/15/14   Mihai Croitoru, MD  omeprazole (PRILOSEC) 20 MG capsule Take 20 mg by mouth daily.    Historical Provider, MD  PARoxetine (PAXIL) 40 MG tablet  11/17/13    Historical Provider, MD  potassium chloride SA (K-DUR,KLOR-CON) 20 MEQ tablet Take 1 tablet (20 mEq total) by mouth 2 (two) times daily. 05/03/14   Wardell Honour, MD  temazepam (RESTORIL) 30 MG capsule Take 30 mg by mouth at bedtime as needed for sleep.    Historical Provider, MD   BP 123/58 mmHg  Pulse 73  Temp(Src) 97.5 F (36.4 C) (Oral)  Resp 18  Ht 5\' 5"  (1.651 m)  Wt 135 lb (61.236 kg)  BMI 22.47 kg/m2  SpO2 100% Physical Exam  Constitutional: She appears well-developed and well-nourished. No distress.  HENT:  Head: Normocephalic and atraumatic.  Mouth/Throat: Oropharynx is  clear and moist. No oropharyngeal exudate.  No signs of trauma no facial tenderness, deformity, malocclusion or hemotympanum.  no battle's sign or racoon eyes.   Eyes: Conjunctivae and EOM are normal. Pupils are equal, round, and reactive to light. Right eye exhibits no discharge. Left eye exhibits no discharge. No scleral icterus.  Neck: Normal range of motion. Neck supple. No JVD present. No thyromegaly present.  Cardiovascular: Normal rate, regular rhythm, normal heart sounds and intact distal pulses.  Exam reveals no gallop and no friction rub.   No murmur heard. Pulmonary/Chest: Effort normal and breath sounds normal. No respiratory distress. She has no wheezes. She has no rales.  Abdominal: Soft. Bowel sounds are normal. She exhibits no distension and no mass. There is no tenderness.  Musculoskeletal: Normal range of motion. She exhibits edema (Scant symmetrical edema ). She exhibits no tenderness.  Lymphadenopathy:    She has no cervical adenopathy.  Neurological: She is alert. Coordination normal.  The patient is awake, alert and has normal cranial nerves III through XII, normal musculoskeletal function, normal strength and sensation of all 4 extremities, normal coordination as tested by finger-nose-finger. She answers all questions appropriately  Skin: Skin is warm and dry. No rash noted. No  erythema.  Psychiatric: She has a normal mood and affect. Her behavior is normal.  No hallucinations, no agitation  Nursing note and vitals reviewed.   ED Course  Procedures (including critical care time)  DIAGNOSTIC STUDIES: Oxygen Saturation is 98% on room air, normal by my interpretation.    COORDINATION OF CARE: 2:01 PM Discussed treatment plan with patient at beside, the patient agrees with the plan and has no further questions at this time.   Labs Review Labs Reviewed  CBC - Abnormal; Notable for the following:    HCT 35.1 (*)    RDW 19.6 (*)    All other components within normal limits  BASIC METABOLIC PANEL - Abnormal; Notable for the following:    Sodium 131 (*)    Potassium 2.0 (*)    Chloride 78 (*)    CO2 35 (*)    Glucose, Bld 112 (*)    BUN 88 (*)    Creatinine, Ser 2.20 (*)    GFR calc non Af Amer 19 (*)    GFR calc Af Amer 22 (*)    Anion gap 18 (*)    All other components within normal limits  URINALYSIS, ROUTINE W REFLEX MICROSCOPIC - Abnormal; Notable for the following:    Hgb urine dipstick TRACE (*)    Leukocytes, UA SMALL (*)    All other components within normal limits  URINE MICROSCOPIC-ADD ON - Abnormal; Notable for the following:    Squamous Epithelial / LPF MANY (*)    Bacteria, UA MANY (*)    All other components within normal limits  BASIC METABOLIC PANEL    Imaging Review Ct Head Wo Contrast  05/26/2014   CLINICAL DATA:  [place] earlier today, did not strike head but has a headache, past history hypertension, atrial fibrillation  EXAM: CT HEAD WITHOUT CONTRAST  TECHNIQUE: Contiguous axial images were obtained from the base of the skull through the vertex without intravenous contrast.  COMPARISON:  11/27/2011  FINDINGS: Mild atrophy.  Normal ventricular morphology.  No midline shift or mass effect.  Normal appearance of brain parenchyma.  No intracranial hemorrhage, mass lesion, or evidence acute infarction.  No extra-axial fluid collections.   Atherosclerotic calcifications at carotid siphons.  Bones is sinuses unremarkable.  IMPRESSION: No acute intracranial abnormalities.   Electronically Signed   By: Lavonia Dana M.D.   On: 05/26/2014 15:13     EKG Interpretation   Date/Time:  Friday May 26 2014 14:14:02 EST Ventricular Rate:  71 PR Interval:    QRS Duration: 156 QT Interval:  643 QTC Calculation: 699 R Axis:   -82 Text Interpretation:  VENTRICULAR PACED RHYTHM Right bundle branch block  Left ventricular hypertrophy Probable inferior infarct, recent Lateral  leads are also involved since last tracing no significant change Confirmed  by Sabra Heck  MD, Harlea Goetzinger ([id number]) on 05/26/2014 2:46:14 PM      MDM   Final diagnoses:  Headache  Hypokalemia  Acute renal failure, unspecified acute renal failure type    The patient describes a very poorly described headache, this came on after her fall, she has no signs of head injury, no seizures, no loss of consciousness according to the patient's report. She had a CT scan of her head approximately 6 months ago which had no acute findings, she frequents the emergency department for multiple complaints, will evaluate for the source of her headache as well as the source of her fall, vital signs and physical exam are reassuring.  Discussed with Dr. Nehemiah Settle who will admit for observation. The patient is significantly hypokalemic, this may have been the cause of her fall, generalized weakness or syncope. She has acute on chronic renal failure as well. She will be admitted to a telemetry bed.  She has severe hypokalemia - she had a syncopal episodes - she required IV K replacement and admission to the hosptial.  K rechecked < 2.0  CRITICAL CARE Performed by: Kimberly [name] Total critical care time: 35 Critical care time was exclusive of separately billable procedures and treating other patients. Critical care was necessary to treat or prevent imminent or life-threatening  deterioration. Critical care was time spent personally by me on the following activities: development of treatment plan with patient and/or surrogate as well as nursing, discussions with consultants, evaluation of patient's response to treatment, examination of patient, obtaining history from patient or surrogate, ordering and performing treatments and interventions, ordering and review of laboratory studies, ordering and review of radiographic studies, pulse oximetry and re-evaluation of patient's condition.   Meds given in ED:  Medications  potassium chloride 10 mEq in 100 mL IVPB (10 mEq Intravenous New Bag/Given 05/26/14 1557)  ibuprofen (ADVIL,MOTRIN) tablet 800 mg (not administered)  magnesium sulfate IVPB 2 g 50 mL (0 g Intravenous Stopped 05/26/14 1557)  sodium chloride 0.9 % bolus 500 mL (500 mLs Intravenous New Bag/Given 05/26/14 1530)    I personally performed the services described in this documentation, which was scribed in my presence. The recorded information has been reviewed and is accurate.    Kimberly Acosta, MD 05/26/14 1603  Kimberly Acosta, MD 05/26/14 [phone]

## 2014-05-26 NOTE — ED Notes (Signed)
Hospitalist at bedside at this time 

## 2014-05-26 NOTE — ED Notes (Signed)
EMS reported they were called to her house for a call earlier today for sacral pain from a fall and wasn't transported at this time and then pt called EMS back out for c/o headache today.

## 2014-05-26 NOTE — ED Notes (Signed)
CRITICAL VALUE ALERT  Critical value received:  Potassium 2.0  Date of notification:  05/26/14  Time of notification:  [id number]  Critical value read back:Yes.    Nurse who received alert:  Rminter, RN  MD notified (1st page):  Dr. Sabra Heck  Time of first page:  1504  MD notified (2nd page):  Time of second page:  Responding MD:  Dr. Sabra Heck  Time MD responded:  [phone]

## 2014-05-27 DIAGNOSIS — I129 Hypertensive chronic kidney disease with stage 1 through stage 4 chronic kidney disease, or unspecified chronic kidney disease: Secondary | ICD-10-CM | POA: Diagnosis present

## 2014-05-27 DIAGNOSIS — I5042 Chronic combined systolic (congestive) and diastolic (congestive) heart failure: Secondary | ICD-10-CM | POA: Diagnosis not present

## 2014-05-27 DIAGNOSIS — Z888 Allergy status to other drugs, medicaments and biological substances status: Secondary | ICD-10-CM | POA: Diagnosis not present

## 2014-05-27 DIAGNOSIS — Y92099 Unspecified place in other non-institutional residence as the place of occurrence of the external cause: Secondary | ICD-10-CM | POA: Diagnosis not present

## 2014-05-27 DIAGNOSIS — N179 Acute kidney failure, unspecified: Secondary | ICD-10-CM | POA: Diagnosis not present

## 2014-05-27 DIAGNOSIS — Z87891 Personal history of nicotine dependence: Secondary | ICD-10-CM | POA: Diagnosis not present

## 2014-05-27 DIAGNOSIS — Z882 Allergy status to sulfonamides status: Secondary | ICD-10-CM | POA: Diagnosis not present

## 2014-05-27 DIAGNOSIS — Z809 Family history of malignant neoplasm, unspecified: Secondary | ICD-10-CM | POA: Diagnosis not present

## 2014-05-27 DIAGNOSIS — N189 Chronic kidney disease, unspecified: Secondary | ICD-10-CM | POA: Diagnosis not present

## 2014-05-27 DIAGNOSIS — Z8249 Family history of ischemic heart disease and other diseases of the circulatory system: Secondary | ICD-10-CM | POA: Diagnosis not present

## 2014-05-27 DIAGNOSIS — W19XXXA Unspecified fall, initial encounter: Secondary | ICD-10-CM | POA: Diagnosis not present

## 2014-05-27 DIAGNOSIS — Y92009 Unspecified place in unspecified non-institutional (private) residence as the place of occurrence of the external cause: Secondary | ICD-10-CM | POA: Diagnosis not present

## 2014-05-27 DIAGNOSIS — Z825 Family history of asthma and other chronic lower respiratory diseases: Secondary | ICD-10-CM | POA: Diagnosis not present

## 2014-05-27 DIAGNOSIS — K630 Abscess of intestine: Secondary | ICD-10-CM | POA: Diagnosis not present

## 2014-05-27 DIAGNOSIS — J019 Acute sinusitis, unspecified: Secondary | ICD-10-CM | POA: Diagnosis present

## 2014-05-27 DIAGNOSIS — I4891 Unspecified atrial fibrillation: Secondary | ICD-10-CM | POA: Diagnosis not present

## 2014-05-27 DIAGNOSIS — N183 Chronic kidney disease, stage 3 (moderate): Secondary | ICD-10-CM | POA: Diagnosis not present

## 2014-05-27 DIAGNOSIS — Z880 Allergy status to penicillin: Secondary | ICD-10-CM | POA: Diagnosis not present

## 2014-05-27 DIAGNOSIS — Z7901 Long term (current) use of anticoagulants: Secondary | ICD-10-CM | POA: Diagnosis not present

## 2014-05-27 DIAGNOSIS — Z66 Do not resuscitate: Secondary | ICD-10-CM | POA: Diagnosis present

## 2014-05-27 DIAGNOSIS — E876 Hypokalemia: Secondary | ICD-10-CM | POA: Diagnosis not present

## 2014-05-27 DIAGNOSIS — Z8659 Personal history of other mental and behavioral disorders: Secondary | ICD-10-CM | POA: Diagnosis not present

## 2014-05-27 DIAGNOSIS — F319 Bipolar disorder, unspecified: Secondary | ICD-10-CM | POA: Diagnosis present

## 2014-05-27 DIAGNOSIS — Z950 Presence of cardiac pacemaker: Secondary | ICD-10-CM | POA: Diagnosis not present

## 2014-05-27 DIAGNOSIS — M109 Gout, unspecified: Secondary | ICD-10-CM | POA: Diagnosis not present

## 2014-05-27 LAB — BASIC METABOLIC PANEL
Anion gap: 11 (ref 5–15)
BUN: 80 mg/dL — ABNORMAL HIGH (ref 6–23)
CO2: 30 mmol/L (ref 19–32)
CREATININE: 2.05 mg/dL — AB (ref 0.50–1.10)
Calcium: 8.9 mg/dL (ref 8.4–10.5)
Chloride: 93 mmol/L — ABNORMAL LOW (ref 96–112)
GFR calc Af Amer: 24 mL/min — ABNORMAL LOW (ref >90)
GFR, EST NON AFRICAN AMERICAN: 21 mL/min — AB (ref >90)
Glucose, Bld: 103 mg/dL — ABNORMAL HIGH (ref 70–99)
POTASSIUM: 2.9 mmol/L — AB (ref 3.5–5.1)
SODIUM: 134 mmol/L — AB (ref 135–145)

## 2014-05-27 LAB — MAGNESIUM: MAGNESIUM: 2.7 mg/dL — AB (ref 1.5–2.5)

## 2014-05-27 MED ORDER — POTASSIUM CHLORIDE CRYS ER 20 MEQ PO TBCR
40.0000 meq | EXTENDED_RELEASE_TABLET | Freq: Two times a day (BID) | ORAL | Status: AC
  Administered 2014-05-27 (×2): 40 meq via ORAL
  Filled 2014-05-27 (×2): qty 2

## 2014-05-27 MED ORDER — MICONAZOLE NITRATE POWD
Freq: Three times a day (TID) | Status: DC
  Administered 2014-05-27 – 2014-05-29 (×7): via TOPICAL

## 2014-05-27 NOTE — Progress Notes (Signed)
[facility] PROGRESS NOTE  Kimberly [name] [id number] DOB: [birth year] DOA: 05/26/2014 PCP: Wardell Honour, MD  Assessment/Plan: 1. Hypokalemia 1. Suspect from over-diuresis (see below) 2. Potassium improved overnight, but still low. Will continue to replace as needed 3. Mg is not low 2. Acute on chronic renal failure 1. Cont with cautious hydration as tolerated in light of pt's hx of CHF 2. Follow renal function closely 3. Avoid nephrotoxins 3. Afib 1. Rate controlled 2. On Eliquis 4. Hx pacemaker placement 1. Stable 5. HTN 1. Stable and controlled 6. Systolic and diastolic CHF 1. Appears euvolemic on exam 2. Holding diuretic secondary to above 7. DVT prophylaxis 1. Eliquis 8. Weakness 1. Will consult PT/OT for recs  Code Status: DNR Family Communication: Pt in room Disposition Plan: Pending  Consultants:    Procedures:    Antibiotics:   (indicate start date, and stop date if known)  HPI/Subjective: No complaints. Feels generally weak  Objective: Filed Vitals:   05/26/14 1600 05/26/14 1713 05/26/14 2110 05/27/14 0734  BP: 138/67 126/63 95/40 115/50  Pulse:  72 71 75  Temp:  97.6 F (36.4 C) 97.9 F (36.6 C) 97 F (36.1 C)  TempSrc:  Oral Oral Oral  Resp: 13 18 20 20   Height:  5\' 5"  (1.651 m)    Weight:  60.963 kg (134 lb 6.4 oz)    SpO2:  93% 100% 100%    Intake/Output Summary (Last 24 hours) at 05/27/14 1209 Last data filed at 05/27/14 0955  Gross per 24 hour  Intake 156.75 ml  Output    800 ml  Net -643.25 ml   Filed Weights   05/26/14 1401 05/26/14 1713  Weight: 61.236 kg (135 lb) 60.963 kg (134 lb 6.4 oz)    Exam:   General:  Awake, in nad  Cardiovascular: regular, s1, s2  Respiratory: normal resp effort, no wheezing  Abdomen: soft,nondistended  Musculoskeletal: perfused, no clubbing   Data Reviewed: Basic Metabolic Panel:  Recent Labs Lab 05/26/14 1415 05/26/14 1521 05/27/14 0542 05/27/14 0800  NA 131*  131* 134*  --   K 2.0* <2.0* 2.9*  --   CL 78* 81* 93*  --   CO2 35* 34* 30  --   GLUCOSE 112* 113* 103*  --   BUN 88* 86* 80*  --   CREATININE 2.20* 2.09* 2.05*  --   CALCIUM 9.8 9.2 8.9  --   MG  --   --   --  2.7*   Liver Function Tests: No results for input(s): AST, ALT, ALKPHOS, BILITOT, PROT, ALBUMIN in the last 168 hours. No results for input(s): LIPASE, AMYLASE in the last 168 hours. No results for input(s): AMMONIA in the last 168 hours. CBC:  Recent Labs Lab 05/26/14 1415  WBC 7.6  HGB 12.0  HCT 35.1*  MCV 89.8  PLT 272   Cardiac Enzymes: No results for input(s): CKTOTAL, CKMB, CKMBINDEX, TROPONINI in the last 168 hours. BNP (last 3 results) No results for input(s): PROBNP in the last 8760 hours. CBG: No results for input(s): GLUCAP in the last 168 hours.  No results found for this or any previous visit (from the past 240 hour(s)).   Studies: Ct Head Wo Contrast  05/26/2014   CLINICAL DATA:  [place] earlier today, did not strike head but has a headache, past history hypertension, atrial fibrillation  EXAM: CT HEAD WITHOUT CONTRAST  TECHNIQUE: Contiguous axial images were obtained from the base of the skull through the vertex without  intravenous contrast.  COMPARISON:  11/27/2011  FINDINGS: Mild atrophy.  Normal ventricular morphology.  No midline shift or mass effect.  Normal appearance of brain parenchyma.  No intracranial hemorrhage, mass lesion, or evidence acute infarction.  No extra-axial fluid collections.  Atherosclerotic calcifications at carotid siphons.  Bones is sinuses unremarkable.  IMPRESSION: No acute intracranial abnormalities.   Electronically Signed   By: Lavonia Dana M.D.   On: 05/26/2014 15:13    Scheduled Meds: . apixaban  2.5 mg Oral BID  . carvedilol  6.25 mg Oral BID WC  . pantoprazole  80 mg Oral Daily  . PARoxetine  40 mg Oral q morning - 10a  . potassium chloride  40 mEq Oral BID  . sodium chloride  3 mL Intravenous Q12H   Continuous  Infusions: . sodium chloride 75 mL/hr at 05/27/14 [id number]    Active Problems:   Hypokalemia   Fall at home   Acute on chronic renal failure  Time spent: 57min  [facility], [facility] Pager [phone]. If 7PM-7AM, please contact night-coverage at [email or web address], password [facility] 05/27/2014, 12:09 PM  LOS: 1 day

## 2014-05-27 NOTE — Progress Notes (Signed)
UR completed 

## 2014-05-28 LAB — BASIC METABOLIC PANEL
ANION GAP: 6 (ref 5–15)
BUN: 55 mg/dL — AB (ref 6–23)
CO2: 27 mmol/L (ref 19–32)
CREATININE: 1.61 mg/dL — AB (ref 0.50–1.10)
Calcium: 8.6 mg/dL (ref 8.4–10.5)
Chloride: 101 mmol/L (ref 96–112)
GFR calc non Af Amer: 28 mL/min — ABNORMAL LOW (ref >90)
GFR, EST AFRICAN AMERICAN: 33 mL/min — AB (ref >90)
Glucose, Bld: 100 mg/dL — ABNORMAL HIGH (ref 70–99)
Potassium: 3.7 mmol/L (ref 3.5–5.1)
Sodium: 134 mmol/L — ABNORMAL LOW (ref 135–145)

## 2014-05-28 MED ORDER — POLYETHYLENE GLYCOL 3350 17 G PO PACK
17.0000 g | PACK | Freq: Every day | ORAL | Status: DC
  Administered 2014-05-28 – 2014-05-29 (×2): 17 g via ORAL
  Filled 2014-05-28 (×4): qty 1

## 2014-05-28 NOTE — Progress Notes (Signed)
[facility] PROGRESS NOTE  Kimberly [name] [id number] DOB: 08-05-1926 DOA: 05/26/2014 PCP: Wardell Honour, MD  Assessment/Plan: 1. Hypokalemia 1. Suspect from over-diuresis (see below) 2. Potassium levels now resolved 3. Mg is not low 2. Acute on chronic renal failure 1. Cont with cautious hydration as tolerated in light of pt's hx of CHF 2. Follow renal function closely 3. Avoid nephrotoxins 3. Afib 1. Rate controlled 2. On Eliquis 4. Hx pacemaker placement 1. Stable 5. HTN 1. Stable and controlled 6. Systolic and diastolic CHF 1. Appears euvolemic on exam 2. Holding diuretic secondary to above 7. DVT prophylaxis 1. Eliquis 8. Weakness 1. Consulted PT/OT for recs 2. Awaiting recs. Suspect pt would benefit from higher level of care as she lives by herself  Code Status: DNR Family Communication: Pt in room Disposition Plan: Pending  Consultants:    Procedures:    Antibiotics:   (indicate start date, and stop date if known)  HPI/Subjective: Feels better today  Objective: Filed Vitals:   05/27/14 0734 05/27/14 1615 05/27/14 2242 05/28/14 0634  BP: 115/50 118/56 110/62 124/48  Pulse: 75 71 74 70  Temp: 97 F (36.1 C) 97.5 F (36.4 C) 98.2 F (36.8 C) 98.7 F (37.1 C)  TempSrc: Oral Oral Oral Oral  Resp: 20 20 20 20   Height:      Weight:      SpO2: 100% 97% 100% 98%    Intake/Output Summary (Last 24 hours) at 05/28/14 1327 Last data filed at 05/28/14 1300  Gross per 24 hour  Intake    953 ml  Output   1250 ml  Net   -297 ml   Filed Weights   05/26/14 1401 05/26/14 1713  Weight: 61.236 kg (135 lb) 60.963 kg (134 lb 6.4 oz)    Exam:   General:  Awake, in nad  Cardiovascular: regular, s1, s2  Respiratory: normal resp effort, no wheezing  Abdomen: soft,nondistended  Musculoskeletal: perfused, no clubbing   Data Reviewed: Basic Metabolic Panel:  Recent Labs Lab 05/26/14 1415 05/26/14 1521 05/27/14 0542 05/27/14 0800  05/28/14 0638  NA 131* 131* 134*  --  134*  K 2.0* <2.0* 2.9*  --  3.7  CL 78* 81* 93*  --  101  CO2 35* 34* 30  --  27  GLUCOSE 112* 113* 103*  --  100*  BUN 88* 86* 80*  --  55*  CREATININE 2.20* 2.09* 2.05*  --  1.61*  CALCIUM 9.8 9.2 8.9  --  8.6  MG  --   --   --  2.7*  --    Liver Function Tests: No results for input(s): AST, ALT, ALKPHOS, BILITOT, PROT, ALBUMIN in the last 168 hours. No results for input(s): LIPASE, AMYLASE in the last 168 hours. No results for input(s): AMMONIA in the last 168 hours. CBC:  Recent Labs Lab 05/26/14 1415  WBC 7.6  HGB 12.0  HCT 35.1*  MCV 89.8  PLT 272   Cardiac Enzymes: No results for input(s): CKTOTAL, CKMB, CKMBINDEX, TROPONINI in the last 168 hours. BNP (last 3 results) No results for input(s): PROBNP in the last 8760 hours. CBG: No results for input(s): GLUCAP in the last 168 hours.  No results found for this or any previous visit (from the past 240 hour(s)).   Studies: Ct Head Wo Contrast  05/26/2014   CLINICAL DATA:  [place] earlier today, did not strike head but has a headache, past history hypertension, atrial fibrillation  EXAM: CT HEAD WITHOUT  CONTRAST  TECHNIQUE: Contiguous axial images were obtained from the base of the skull through the vertex without intravenous contrast.  COMPARISON:  11/27/2011  FINDINGS: Mild atrophy.  Normal ventricular morphology.  No midline shift or mass effect.  Normal appearance of brain parenchyma.  No intracranial hemorrhage, mass lesion, or evidence acute infarction.  No extra-axial fluid collections.  Atherosclerotic calcifications at carotid siphons.  Bones is sinuses unremarkable.  IMPRESSION: No acute intracranial abnormalities.   Electronically Signed   By: Lavonia Dana M.D.   On: 05/26/2014 15:13    Scheduled Meds: . apixaban  2.5 mg Oral BID  . carvedilol  6.25 mg Oral BID WC  . miconazole nitrate   Topical TID  . pantoprazole  80 mg Oral Daily  . PARoxetine  40 mg Oral q morning - 10a   . sodium chloride  3 mL Intravenous Q12H   Continuous Infusions: . sodium chloride 75 mL/hr at 05/28/14 1113    Active Problems:   Hypokalemia   Fall at home   Acute on chronic renal failure  Time spent: 23min  Kimberly [name], [facility] Pager [phone]. If 7PM-7AM, please contact night-coverage at [email or web address], password [facility] 05/28/2014, 1:27 PM  LOS: 2 days

## 2014-05-28 NOTE — BH Assessment (Signed)
Clinician received call from Lovena Le, RN stated Dr. Wyline Copas requesting med consult due to patient hx of bipolar.  Per RN patient does not endorse SI, HI and AVH. Patient is 81 y/o female who lives alone. Family concerned patient is not taking medication. Clinician provided RN contact number to [facility] to consult about med recommendations.  Rigoberto Noel, MSW, [name] Therapeutic Triage

## 2014-05-29 DIAGNOSIS — F309 Manic episode, unspecified: Secondary | ICD-10-CM

## 2014-05-29 DIAGNOSIS — Z8659 Personal history of other mental and behavioral disorders: Secondary | ICD-10-CM

## 2014-05-29 LAB — BASIC METABOLIC PANEL
Anion gap: 7 (ref 5–15)
BUN: 46 mg/dL — AB (ref 6–23)
CO2: 24 mmol/L (ref 19–32)
Calcium: 8.4 mg/dL (ref 8.4–10.5)
Chloride: 105 mmol/L (ref 96–112)
Creatinine, Ser: 1.37 mg/dL — ABNORMAL HIGH (ref 0.50–1.10)
GFR calc Af Amer: 40 mL/min — ABNORMAL LOW (ref >90)
GFR, EST NON AFRICAN AMERICAN: 34 mL/min — AB (ref >90)
Glucose, Bld: 93 mg/dL (ref 70–99)
Potassium: 3.8 mmol/L (ref 3.5–5.1)
Sodium: 136 mmol/L (ref 135–145)

## 2014-05-29 MED ORDER — FUROSEMIDE 40 MG PO TABS: 40.0000 mg | ORAL_TABLET | Freq: Every day | ORAL | Status: DC

## 2014-05-29 NOTE — Evaluation (Signed)
Physical Therapy Evaluation Patient Details Name: Kimberly [name] MRN: [id number] DOB: [birth year] Today's Date: 05/29/2014   History of Present Illness  With a history of atrial fibrillation with biventricular cardiac pacemaker, chronic anticoagulation with Eliquis, hypertension, stage III chronic kidney disease, combined systolic and diastolic CHF (NYHA class IV). Patient's had a fall at home where she developed a sudden onset of lightheadedness. She did not strike her head, but due to difficulty in getting up, she presented to the hospital for evaluation. Other than general soreness, she is asymptomatic.  She is found to have hypokalemia.  Clinical Impression  Pt was seen for evaluation and found to be moderately deconditioned with LE weakness which is seen primarily during gait.  She lives alone and states that she has been managing fairly well but admits that she will need to transition to ACLF.  She plans to move to [place] in order to be closer to her daughter. In the meantime, she will be able to discharge to home where I am recommending HHPT for LE strengthening.  She will also need a new rolling walker as the right front leg of her walker is bent.    Follow Up Recommendations Home health PT    Equipment Recommendations  Rolling walker with 5" wheels (pt's current walker is bent)    Recommendations for Other Services   none    Precautions / Restrictions Precautions Precautions: Fall Restrictions Weight Bearing Restrictions: No      Mobility  Bed Mobility Overal bed mobility: Independent                Transfers Overall transfer level: Modified independent Equipment used: Rolling walker (2 wheeled)                Ambulation/Gait Ambulation/Gait assistance: Modified independent (Device/Increase time) Ambulation Distance (Feet): 150 Feet Assistive device: Rolling walker (2 wheeled) Gait Pattern/deviations: Trunk flexed;Decreased dorsiflexion - right;Decreased  dorsiflexion - left   Gait velocity interpretation: Below normal speed for age/gender General Gait Details: pt is unable to extend knees in stance  Stairs            Wheelchair Mobility    Modified Rankin (Stroke Patients Only)       Balance Overall balance assessment: Independent                                           Pertinent Vitals/Pain Pain Assessment: 0-10 Pain Score: 9  Pain Location: head, neck buttocks--pt states that she lives with pain at a 9-10 chronically Pain Descriptors / Indicators: Constant Pain Intervention(s): Premedicated before session    Home Living Family/patient expects to be discharged to:: Private residence Living Arrangements: Alone Available Help at Discharge: Family;Friend(s) Type of Home: House Home Access: Stairs to enter Entrance Stairs-Rails: None Entrance Stairs-Number of Steps: 2 Home Layout: One level Home Equipment: Environmental consultant - 2 wheels;Bedside commode      Prior Function Level of Independence: Independent with assistive device(s)               Hand Dominance   Dominant Hand: Right    Extremity/Trunk Assessment   Upper Extremity Assessment: Defer to OT evaluation           Lower Extremity Assessment: Generalized weakness      Cervical / Trunk Assessment: Kyphotic;Other exceptions  Communication   Communication: No difficulties  Cognition Arousal/Alertness: Awake/alert Behavior During  Therapy: WFL for tasks assessed/performed Overall Cognitive Status: Within Functional Limits for tasks assessed                      General Comments      Exercises        Assessment/Plan    PT Assessment All further PT needs can be met in the next venue of care  PT Diagnosis Abnormality of gait;Difficulty walking;Generalized weakness   PT Problem List    PT Treatment Interventions     PT Goals (Current goals can be found in the Care Plan section) Acute Rehab PT Goals PT Goal  Formulation: All assessment and education complete, DC therapy    Frequency     Barriers to discharge        Co-evaluation PT/OT/SLP Co-Evaluation/Treatment: Yes Reason for Co-Treatment: For patient/therapist safety PT goals addressed during session: Mobility/safety with mobility         End of Session Equipment Utilized During Treatment: Gait belt Activity Tolerance: Patient tolerated treatment well Patient left: in chair;with call bell/phone within reach;with nursing/sitter in room Nurse Communication: Mobility status         Time: 3875-6433 PT Time Calculation (min) (ACUTE ONLY): 17 min   Charges:   PT Evaluation $Initial PT Evaluation Tier I: 1 Procedure     PT G CodesDemetrios Isaacs L 05/29/2014, 8:59 AM

## 2014-05-29 NOTE — Progress Notes (Signed)
First shift RN states she removed patient IV access due to pt possibly being discharged and states MD is aware. PT currently has no IV access.

## 2014-05-29 NOTE — Progress Notes (Signed)
Pt received telepsych consult earlier today, and the screening went well. Pt will be discharged home in the morning.

## 2014-05-29 NOTE — Evaluation (Signed)
Occupational Therapy Evaluation Patient Details Name: Kimberly [name] MRN: [id number] DOB: [birth year] Today's Date: 05/29/2014    History of Present Illness With a history of atrial fibrillation with biventricular cardiac pacemaker, chronic anticoagulation with Eliquis, hypertension, stage III chronic kidney disease, combined systolic and diastolic CHF (NYHA class IV). Patient's had a fall at home where she developed a sudden onset of lightheadedness. She did not strike her head, but due to difficulty in getting up, she presented to the hospital for evaluation. Other than general soreness, she is asymptomatic   Clinical Impression   Patient in bed upon therapy arrival. Agreeable to participate in evaluation. Pt reports that she lives alone and completes all BADL task independently. UB strength is baseline at 4+/5. Patient is able to complete LB dressing independently. No additional OT services required at this time.     Follow Up Recommendations  No OT follow up    Equipment Recommendations  None recommended by OT       Precautions / Restrictions Precautions Precautions: Fall Restrictions Weight Bearing Restrictions: No      Mobility Bed Mobility Overal bed mobility: Independent                Transfers Overall transfer level: Modified independent Equipment used: Rolling walker (2 wheeled)                  Balance Overall balance assessment: Independent                                          ADL Overall ADL's : Independent                                                       Pertinent Vitals/Pain Pain Assessment: 0-10 Pain Score: 9  Pain Location: head, neck buttocks--pt states that she lives with pain at a 9-10 chronically Pain Descriptors / Indicators: Constant Pain Intervention(s): Premedicated before session     Hand Dominance Right   Extremity/Trunk Assessment Upper Extremity Assessment Upper Extremity  Assessment: Overall WFL for tasks assessed   Lower Extremity Assessment Lower Extremity Assessment: Defer to PT evaluation   Cervical / Trunk Assessment Cervical / Trunk Assessment: Kyphotic;Other exceptions Cervical / Trunk Exceptions: thoracic scoliosis   Communication Communication Communication: No difficulties   Cognition Arousal/Alertness: Awake/alert Behavior During Therapy: WFL for tasks assessed/performed Overall Cognitive Status: Within Functional Limits for tasks assessed                                Home Living Family/patient expects to be discharged to:: Private residence Living Arrangements: Alone Available Help at Discharge: Family;Friend(s) Type of Home: House Home Access: Stairs to enter [employer] of Steps: 2 Entrance Stairs-Rails: None Home Layout: One level     Bathroom Shower/Tub: Walk-in shower;Other (comment) (walk in tub with door)   Bathroom Toilet: Standard     Home Equipment: Walker - 2 wheels;Bedside commode          Prior Functioning/Environment Level of Independence: Independent with assistive device(s)  Co-evaluation   Reason for Co-Treatment: For patient/therapist safety PT goals addressed during session: Mobility/safety with mobility        End of Session Equipment Utilized During Treatment: Gait belt  Activity Tolerance: Patient tolerated treatment well Patient left: in chair;with call bell/phone within reach;with nursing/sitter in room   Time: 0826-0852 OT Time Calculation (min): 26 min Charges:  OT General Charges $OT Visit: 1 Procedure OT Evaluation $Initial OT Evaluation Tier I: 1 Procedure G-Codes:    Ailene Ravel, OTR/L,CBIS  [phone]  05/29/2014, 9:45 AM

## 2014-05-29 NOTE — Care Management Note (Addendum)
    Page 1 of 2   05/30/2014     10:18:08 AM CARE MANAGEMENT NOTE 05/30/2014  Patient:  Kimberly [name], Kimberly [name]   Account Number:  [id number]  Date Initiated:  05/29/2014  Documentation initiated by:  Theophilus Kinds  Subjective/Objective Assessment:   Pt admitted from home with hypokalemia. Pt lives alone and has a daughter that lives in [place]. Pt has a handiman that helps with things around the house. Pt uses [employer] for transportation. Pt has an old RW for home use.     Action/Plan:   PT recommends HH PT at discharge and a new RW. Pt choses [facility] for [facility] and DME. Romualdo Bolk of [facility] is aware and will collect pts information from the chart. [place] services to start within 48 hours of d/c. Pt and pts nurse aware.   Anticipated DC Date:  05/29/2014   Anticipated DC Plan:  [place]  CM consult      [facility] Choice  HOME HEALTH   Choice offered to / List presented to:  C-1 Patient   DME arranged  Vassie Moselle      DME agency  [place] arranged  [place].   Status of service:  Completed, signed off Medicare Important Message given?  YES (If response is "NO", the following Medicare IM given date fields will be blank) Date Medicare IM given:  05/29/2014 Medicare IM given by:  Theophilus Kinds Date Additional Medicare IM given:   Additional Medicare IM given by:    Discharge Disposition:  [place]  Per UR Regulation:    If discussed at Long Length of Stay Meetings, dates discussed:    Comments:  05/30/14 [place], RN BSN CM Pt did not discharge on 05/29/14 due to not being able to get into her home. Pts "handiman" has been called and he has opened pts home and will be coming to get her today. Pt d/c'd today. HH in place.  05/29/14 [place], RN BSN CM Pt discharged home today. HH arranged with [facility]. Pt and pts nurse aware of discharge  arrangements. RW at the bedside.  05/29/14 [place], RN BSN CM

## 2014-05-29 NOTE — Discharge Summary (Signed)
Physician Discharge Summary  ADALYND BEAUCHAINE [id number] DOB: 06/07/1926 DOA: 05/26/2014  PCP: Wardell Honour, MD  Admit date: 05/26/2014 Discharge date: 05/29/2014  Time spent: 25 minutes  Recommendations for Outpatient Follow-up:  1. Follow up with PCP 1-2 weeks 2. Follow up with Cardiology at earliest apointment 3. Follow up with Behavioral Health  Discharge Diagnoses:  Active Problems:   Hypokalemia   Fall at home   Acute on chronic renal failure   History of bipolar disorder  Discharge Condition: Improved  Diet recommendation: Heart Healthy  Filed Weights   05/26/14 1401 05/26/14 1713  Weight: 61.236 kg (135 lb) 60.963 kg (134 lb 6.4 oz)    History of present illness:  Please review h and p from 1/22 for details. Briefly, pt presents s/p fall, found to have ARF with hypokalemia on presentation. The patient was admitted for further work up.  Hospital Course:  1. Hypokalemia 1. Suspect from over-diuresis (see below) 2. Potassium levels resolved 3. Mg was not low 2. Acute on chronic renal failure 1. Patient was continued with cautious hydration as tolerated in light of pt's hx of CHF 2. Avoided nephrotoxins 3. Renal function improved nicely with fluids 3. Afib 1. Remained rate controlled 2. On Eliquis 4. Hx pacemaker placement 1. Stable 5. HTN 1. Stable and controlled 6. Systolic and diastolic CHF 1. Appeared euvolemic on exam 2. Held diuretic secondary to above 3. Would resume on lower dose of lasix on discharge (40mg ) 7. DVT prophylaxis 1. Eliquis 8. Weakness 1. Consulted PT/OT 2. Recs for home PT 9. Bipolar/Depression 1. Consulted psychiatry 2. No medication changes at this time per Psych recs  Consultations:  Psychiatry  Discharge Exam: Filed Vitals:   05/28/14 0634 05/28/14 1537 05/28/14 2135 05/29/14 0514  BP: 124/48 121/44 132/52 123/47  Pulse: 70 73 72 70  Temp: 98.7 F (37.1 C) 98.4 F (36.9 C) 97.9 F (36.6 C) 98.2 F (36.8 C)   TempSrc: Oral Oral Oral Oral  Resp: 20 20 20 20   Height:      Weight:      SpO2: 98% 98% 97% 98%    General: Awake, in nad Cardiovascular: regular, s1, s2 Respiratory: normal resp effort, no wheezing  Discharge Instructions     Medication List    STOP taking these medications        metolazone 2.5 MG tablet  Commonly known as:  ZAROXOLYN      TAKE these medications        apixaban 2.5 MG Tabs tablet  Commonly known as:  ELIQUIS  Take 1 tablet by mouth  twice a day     carvedilol 6.25 MG tablet  Commonly known as:  COREG  Take 1 tablet (6.25 mg total) by mouth 2 (two) times daily with a meal.     eszopiclone 2 MG Tabs tablet  Commonly known as:  LUNESTA  Take 1 tablet (2 mg total) by mouth at bedtime as needed for sleep. Take immediately before bedtime     Ferrous Sulfate 27 MG Tabs  Take 1 tablet by mouth daily.     furosemide 40 MG tablet  Commonly known as:  LASIX  Take 1 tablet (40 mg total) by mouth daily.     HYDROcodone-acetaminophen 5-325 MG per tablet  Commonly known as:  NORCO/VICODIN  Take 1-2 tablets by mouth every 6 (six) hours as needed for moderate pain.     omeprazole 20 MG capsule  Commonly known as:  PRILOSEC  Take 20  mg by mouth daily.     PARoxetine 40 MG tablet  Commonly known as:  PAXIL  Take 40 mg by mouth every morning.     potassium chloride SA 20 MEQ tablet  Commonly known as:  K-DUR,KLOR-CON  Take 1 tablet (20 mEq total) by mouth 2 (two) times daily.       Allergies  Allergen Reactions  . Iron Swelling    Ferrous Sulfate - tongue swelling   . Iodine Rash and Other (See Comments)    REACTION:If injected,  Rash/irritated skin reaction "welts"  . Penicillins Hives  . Sulfa Antibiotics Rash   Follow-up Information    Follow up with [facility].   Contact information:   [address] [place] [address] [phone]       Follow up with Sanda Klein, MD.   Specialty:  Cardiology   Why:   at earliest available appointment   Contact information:   [address] [name] [address] [phone]       Follow up with Wardell Honour, MD. Schedule an appointment as soon as possible for a visit in 1 week.   Specialty:  Family Medicine   Contact information:   [place] [address] [phone]        The results of significant diagnostics from this hospitalization (including imaging, microbiology, ancillary and laboratory) are listed below for reference.    Significant Diagnostic Studies: Ct Head Wo Contrast  05/26/2014   CLINICAL DATA:  [place] earlier today, did not strike head but has a headache, past history hypertension, atrial fibrillation  EXAM: CT HEAD WITHOUT CONTRAST  TECHNIQUE: Contiguous axial images were obtained from the base of the skull through the vertex without intravenous contrast.  COMPARISON:  11/27/2011  FINDINGS: Mild atrophy.  Normal ventricular morphology.  No midline shift or mass effect.  Normal appearance of brain parenchyma.  No intracranial hemorrhage, mass lesion, or evidence acute infarction.  No extra-axial fluid collections.  Atherosclerotic calcifications at carotid siphons.  Bones is sinuses unremarkable.  IMPRESSION: No acute intracranial abnormalities.   Electronically Signed   By: Lavonia Dana M.D.   On: 05/26/2014 15:13    Microbiology: No results found for this or any previous visit (from the past 240 hour(s)).   Labs: Basic Metabolic Panel:  Recent Labs Lab 05/26/14 1415 05/26/14 1521 05/27/14 0542 05/27/14 0800 05/28/14 0638 05/29/14 0542  NA 131* 131* 134*  --  134* 136  K 2.0* <2.0* 2.9*  --  3.7 3.8  CL 78* 81* 93*  --  101 105  CO2 35* 34* 30  --  27 24  GLUCOSE 112* 113* 103*  --  100* 93  BUN 88* 86* 80*  --  55* 46*  CREATININE 2.20* 2.09* 2.05*  --  1.61* 1.37*  CALCIUM 9.8 9.2 8.9  --  8.6 8.4  MG  --   --   --  2.7*  --   --    Liver Function Tests: No results for input(s): AST,  ALT, ALKPHOS, BILITOT, PROT, ALBUMIN in the last 168 hours. No results for input(s): LIPASE, AMYLASE in the last 168 hours. No results for input(s): AMMONIA in the last 168 hours. CBC:  Recent Labs Lab 05/26/14 1415  WBC 7.6  HGB 12.0  HCT 35.1*  MCV 89.8  PLT 272   Cardiac Enzymes: No results for input(s): CKTOTAL, CKMB, CKMBINDEX, TROPONINI in the last 168 hours. BNP: BNP (last 3 results)  No results for input(s): PROBNP in the last 8760 hours. CBG: No results for input(s): GLUCAP in the last 168 hours.     Signed:  Pritika Alvarez, Orpah Melter  [facility] 05/29/2014, 2:39 PM

## 2014-05-29 NOTE — Consult Note (Signed)
Telepsych Consultation   Reason for Consult:  Manic Behavior Referring Physician:  [place] Patient Identification: Kimberly [name] MRN:  [id number] Principal Diagnosis: History of bipolar disorder Diagnosis:   Patient Active Problem List   Diagnosis Date Noted  . History of bipolar disorder [Z86.59]   . Hypokalemia [E87.6] 05/26/2014  . Fall at home [W19.Merril Abbe, [id number] 05/26/2014  . Acute on chronic renal failure [N17.9, N18.9] 05/26/2014  . Acute on chronic combined systolic and diastolic CHF, NYHA class 4 [I50.43] 11/15/2013  . Complete heart block [I44.2] 11/15/2013  . Colonic diverticular abscess [K57.20] 09/07/2013  . Acute gouty arthritis [M10.9] 09/05/2013  . Inability to ambulate due to ankle or foot [R26.2] 09/05/2013  . Unspecified constipation [K59.00] 08/26/2013  . Anemia [D64.9] 08/25/2013  . Acute diverticulitis [K57.92] 08/24/2013  . CKD (chronic kidney disease) stage 3, GFR 30-59 ml/min [N18.3] 10/12/2012  . Chronic anticoagulation [Z79.01] 10/12/2012  . Dizziness [R42] 10/11/2012  . Chest pain at rest [R07.9] 12/14/2011  . Osteoarthritis [M19.90]   . Atrial fibrillation [I48.91]   . Biventricular cardiac pacemaker - [employer] [Z95.0]   . Fibromyalgia [M79.7]   . Hypertension [I10]   . Depression [F32.9]     Total Time spent with patient: 25 minutes  Subjective:   Kimberly [name] is a 81 y.o. female patient admitted with multiple medical comorbidities and a history of bipolar. According to [facility], pt presented with bright lipstick and makeup, but did not display other manic symptoms at the time of evaluation. She did, however, report having been awake for approximately 4 days. During assessment today, pt presents as alert, very oriented to all situations (x4), coherent, and answering questions appropriately. There is no objective evidence of mania or responding to internal stimuli. Pt denies SI, HI, and AVH, contracts for safety and possesses excellent  insight as to her present condition and family dynamics as well. Pt does report a period of 4 days without sleep and being "very productive, cleaning the closet out multiple times". Pt reports that "I always wear bright colors, though; that is nothing unusual for me".   The manic symptoms referenced in this narrative appear to have resolved at this point in time and pt is deemed psychiatrically stable for discharge once medically cleared. Pt will followup with psychiatry/counseling as assisted by [facility] staff members. It is recommended that the new provider consider implementation of a mood stabilizer, provided that her renal function and other abnormal lab values indicate a safe baseline for such intervention. At this time, pt may continue her Paxil 40mg  to control her depression. Psychiatry is recommending that we refrain from medication changes during her admission due to risk/benefit given her comorbidities, age, and current stable condition.   HPI:  Kimberly [name] is a 81 y.o. Female. With a history of atrial fibrillation with biventricular cardiac pacemaker, chronic anticoagulation with Eliquis, hypertension, stage III chronic kidney disease, combined systolic and diastolic CHF (NYHA class IV). Patient's had a fall at home where she developed a sudden onset of lightheadedness. She did not strike her head, but due to difficulty in getting up, she presented to the hospital for evaluation. Other than general soreness, she is asymptomatic. Attending MD was concerned about her history of bipolar, presenting with possible manic symptoms and self-report of 4 days without sleep and pt wearing bright colors with bright lipstick.   HPI Elements:   Location:  Psychiatric. Quality:  Stable, Improving. Severity:  Moderate. Timing:  Intermittent. Duration:  Transient, Acute.  Context:  Exacerbation of underlying history of bipolar disorder with presentation of manic traits (impulsivity, insomnia x4 days,  obsessions, extravagant decor).  Past Medical History:  Past Medical History  Diagnosis Date  . Depression   . Atrial fibrillation   . Scoliosis   . Osteoarthritis   . Pacemaker     Last saw cards 07/2013  . Fibromyalgia   . Hypertension   . Glaucoma   . Acute diverticulitis 08/24/2013  . CKD (chronic kidney disease) stage 3, GFR 30-59 ml/min 10/12/2012  . Chronic anticoagulation 10/12/2012  . Acute blood loss anemia 10/11/2012  . Antral ulcer 10/11/2012  . Erosive esophagitis 10/11/2012  . Cardiomyopathy, nonischemic   . Acute on chronic combined systolic and diastolic CHF, NYHA class 4 11/15/2013  . Arrhythmia     atrial fibb  . H/O echocardiogram 2007    EF 40-45%,           Past Surgical History  Procedure Laterality Date  . Pacemaker insertion    . Abdominal hysterectomy    . Cholecystectomy    . Appendectomy    . Breast surgery    . Back surgery    . Neck surgery    . Hernia repair      right inguinal hernia and umbilical  . Tonsillectomy    . Esophagogastroduodenoscopy N/A 10/13/2012    Procedure: ESOPHAGOGASTRODUODENOSCOPY (EGD);  Surgeon: Daneil Dolin, MD;  Location: [facility];  Service: Endoscopy;  Laterality: N/A;  . Cystoscopy N/A 02/24/2013    Procedure: CYSTOSCOPY WITH URETHRAL DILITATION;  Surgeon: Marissa Nestle, MD;  Location: [facility] ORS;  Service: Urology;  Laterality: N/A;  . Doppler echocardiography N/A 05/30/2010    LV SIZE IS NORMAL. LV SYSTOLIC FUNCTION IS LOW NORMAL. EF=50-55%. MILD INFERIOR HYPOKINESIS.MILD TO MODERATE POSTERIOR WALL HYPOKINESIS.PACEMAKER LEAD IN THE RV. LA IS MILDLY DILATED. RA IS MODERATE TO SEVERLY DILATED. PACEMAKER LEAD IN THE RA. MILD CALCICICATION OF THE MV APPARATUS. MODERATE MR. MILD TO MODERATE TR. MILD PHTN.AV MILDLY SCLEROTIC.  . Nuclear stress test N/A 02/13/2009    NORMAL PATTERN OF PERFUSION IN ALL REGIONS. POST STRESS VENTICULAR SIZE IS NORMAL. POST  STESS EF 85%.  NORMAL MYOCARDIAL PERFUSION STUDY.  . Cardiac  catheterization  12/08/2005    LAD AND LEFT MAIN WITH NO HIGH-GRADE STENOSIS. MILD DISEASE IN THE CX AND LAD SYSTEM. SEVERE LV DYSFUNCTION WITH DILATION OF THE LV. EF 15-20%. LV END-DIASTOLIC PRESSURE IS 90. +1 MR.  Fawn Kirk ext venous Bilateral 11-08-10    R & L- NO EVIDENCE OF THROMBUS OR THROMBOPHLEBITIS. THERE IS MILD AMOUNT OF SUBCUTANEOUS EDEMA NOTED WITHIN THE LEFT CALF AND ANKLE. R & L GSV AND SSV- NO VENOUS INSUFF NOTED.   Family History:  Family History  Problem Relation Age of Onset  . Cancer Sister   . Asthma Sister   . Heart failure Brother   . Colon cancer Neg Hx    Social History:  History  Alcohol Use  . 0.0 oz/week  . 0 Glasses of wine per week    Comment: history of drinking a glass of wine in the evening     History  Drug Use No    History   Social History  . Marital Status: Widowed    Spouse Name: N/A    Number of Children: N/A  . Years of Education: N/A   Occupational History  . telephone operator     retired   Social History Main Topics  . Smoking status:  Former Smoker -- 0.50 packs/day for 30 years    Types: Cigarettes    Quit date: 12/13/2004  . Smokeless tobacco: None  . Alcohol Use: 0.0 oz/week    0 Glasses of wine per week     Comment: history of drinking a glass of wine in the evening  . Drug Use: No  . Sexual Activity: No   Other Topics Concern  . None   Social History Narrative   Additional Social History:                          Allergies:   Allergies  Allergen Reactions  . Iron Swelling    Ferrous Sulfate - tongue swelling   . Iodine Rash and Other (See Comments)    REACTION:If injected,  Rash/irritated skin reaction "welts"  . Penicillins Hives  . Sulfa Antibiotics Rash    Vitals: Blood pressure 123/47, pulse 70, temperature 98.2 F (36.8 C), temperature source Oral, resp. rate 20, height 5\' 5"  (1.651 m), weight 60.963 kg (134 lb 6.4 oz), SpO2 98 %.  Risk to Self: Is patient at risk for suicide?: No Risk to  Others:   Prior Inpatient Therapy:   Prior Outpatient Therapy:    Current Facility-Administered Medications  Medication Dose Route Frequency Provider Last Rate Last Dose  . acetaminophen (TYLENOL) tablet 650 mg  650 mg Oral Q6H PRN Tanna Savoy Stinson, DO   650 mg at 05/28/14 0825   Or  . acetaminophen (TYLENOL) suppository 650 mg  650 mg Rectal Q6H PRN Truett Mainland, DO      . alum & mag hydroxide-simeth (MAALOX/MYLANTA) 200-200-20 MG/5ML suspension 30 mL  30 mL Oral Q6H PRN Truett Mainland, DO      . apixaban Arne Cleveland) tablet 2.5 mg  2.5 mg Oral BID Tanna Savoy Stinson, DO   2.5 mg at 05/29/14 [id number]  . carvedilol (COREG) tablet 6.25 mg  6.25 mg Oral BID WC Tanna Savoy Stinson, DO   6.25 mg at 05/29/14 [id number]  . docusate sodium (COLACE) capsule 100 mg  100 mg Oral Daily PRN Tanna Savoy Stinson, DO   100 mg at 05/28/14 1850  . HYDROcodone-acetaminophen (NORCO/VICODIN) 5-325 MG per tablet 1 tablet  1 tablet Oral Q6H PRN Truett Mainland, DO   1 tablet at 05/29/14 0608  . miconazole nitrate (MICATIN) topical powder   Topical TID Dianne Dun, NP      . ondansetron [facility]) tablet 4 mg  4 mg Oral Q6H PRN Truett Mainland, DO       Or  . ondansetron [facility]) injection 4 mg  4 mg Intravenous Q6H PRN Tanna Savoy Stinson, DO      . pantoprazole (PROTONIX) EC tablet 80 mg  80 mg Oral Daily Tanna Savoy Stinson, DO   80 mg at 05/29/14 0901  . PARoxetine (PAXIL) tablet 40 mg  40 mg Oral q morning - 10a Tanna Savoy Stinson, DO   40 mg at 05/29/14 0901  . polyethylene glycol (MIRALAX / GLYCOLAX) packet 17 g  17 g Oral Daily Donne Hazel, MD   17 g at 05/29/14 0901  . sodium chloride 0.9 % injection 3 mL  3 mL Intravenous Q12H Tanna Savoy Stinson, DO   3 mL at 05/29/14 [id number]  . zolpidem (AMBIEN) tablet 5 mg  5 mg Oral QHS PRN Truett Mainland, DO   5 mg at 05/28/14 2211    Musculoskeletal: Strength & Muscle Tone:  within normal limits Gait & Station: normal Patient leans: N/A  Psychiatric Specialty Exam:     Blood  pressure 123/47, pulse 70, temperature 98.2 F (36.8 C), temperature source Oral, resp. rate 20, height 5\' 5"  (1.651 m), weight 60.963 kg (134 lb 6.4 oz), SpO2 98 %.Body mass index is 22.37 kg/(m^2).  General Appearance: Casual and Fairly Groomed  Engineer, water::  Good  Speech:  Clear and Coherent and Normal Rate  Volume:  Normal  Mood:  Euthymic  Affect:  Appropriate, Congruent and Full Range  Thought Process:  Coherent, Goal Directed, Linear and Logical  Orientation:  Full (Time, Place, and Person)  Thought Content:  WDL  Suicidal Thoughts:  No  Homicidal Thoughts:  No  Memory:  Immediate;   Good Recent;   Good Remote;   Good  Judgement:  Fair  Insight:  Good  Psychomotor Activity:  Normal  Concentration:  Good  Recall:  Good  Fund of Knowledge:Good  Language: Good  Akathisia:  No  Handed:    AIMS (if indicated):     Assets:  Communication Skills Desire for Improvement Housing Resilience Social Support  ADL's:  Intact  Cognition: WNL  Sleep:      Medical Decision Making: Established Problem, Stable/Improving (1), Review of Psycho-Social Stressors (1), Review or order clinical lab tests (1), Discuss test with performing physician (1), Review and summation of old records (2), Review or order medicine tests (1) and Review of Medication Regimen & Side Effects (2)  Problem Points: Established problem, stable/improving (1) and Review of psycho-social stressors (1)  Data Points: Discuss tests with performing physician (1) Review or order clinical lab tests (1) Review and summation of old records (2) Review of medication regiment & side effects (2)  Treatment Plan Summary: See below  Plan:  No evidence of imminent risk to self or others at present.   Patient does not meet criteria for psychiatric inpatient admission. Supportive therapy provided about ongoing stressors. Refer to IOP. Discussed crisis plan, support from social network, calling 911, coming to the Emergency  Department, and calling Suicide Hotline.  Disposition: -No medication changes at this time due to risk vs. benefit (age, asymptomatic at present) -Discharge home when medically cleared -Refer to outpatient psychiatry/counseling for management of psychiatric symptoms ([place] [facility] to assist)  *Case reviewed with Dr. Parke Poisson.   Benjamine Mola, FNP-BC 05/29/2014 1:18 PM   Case reviewed with me as above

## 2014-05-30 ENCOUNTER — Ambulatory Visit (HOSPITAL_COMMUNITY): Payer: Self-pay | Admitting: Hematology & Oncology

## 2014-05-30 DIAGNOSIS — J0120 Acute ethmoidal sinusitis, unspecified: Secondary | ICD-10-CM

## 2014-05-30 MED ORDER — LEVOFLOXACIN 500 MG PO TABS: 500.0000 mg | ORAL_TABLET | Freq: Every day | ORAL | Status: DC

## 2014-05-30 NOTE — Progress Notes (Signed)
Pt stated she had home medications that were not logged in the pharmacy. Explained policy regarding home meds being locked up with pharmacy. Pt verbalized understanding. Medications logged, counted, and given to [facility].

## 2014-05-30 NOTE — Progress Notes (Signed)
[facility] PROGRESS NOTE  Kimberly [name] [id number] DOB: 14-Feb-1927 DOA: 05/26/2014 PCP: Wardell Honour, MD  Assessment/Plan: 1. Hypokalemia 1. Suspect from over-diuresis (see below) 2. Potassium levels resolved 3. Mg was not low 2. Acute on chronic renal failure 1. Patient was continued with cautious hydration as tolerated in light of pt's hx of CHF 2. Avoided nephrotoxins 3. Renal function improved nicely with fluids 3. Afib 1. Remained rate controlled 2. On Eliquis 4. Hx pacemaker placement 1. Stable 5. HTN 1. Stable and controlled 6. Systolic and diastolic CHF 1. Appeared euvolemic on exam 2. Held diuretic secondary to above 3. Would resume on lower dose of lasix on discharge (40mg ) 7. DVT prophylaxis 1. Eliquis 8. Weakness 1. Consulted PT/OT 2. Recs for home PT 9. Bipolar/Depression 1. Consulted psychiatry 2. No medication changes at this time per Psych recs 10. Sinusitis 1. Pt with symptoms of acute sinusitis 2. Will give course of levaquin  Code Status: DNR Family Communication: Pt in room Disposition Plan: discharge today  Consultants:    Procedures:    Antibiotics:   (indicate start date, and stop date if known)  HPI/Subjective: Complains of sinusitis  Objective: Filed Vitals:   05/29/14 0514 05/29/14 1550 05/29/14 2127 05/30/14 0604  BP: 123/47 147/76 141/50 131/51  Pulse: 70 70 73 76  Temp: 98.2 F (36.8 C) 97.3 F (36.3 C) 97.5 F (36.4 C) 97.8 F (36.6 C)  TempSrc: Oral Oral Oral Oral  Resp: 20 20 20 21   Height:      Weight:      SpO2: 98% 99% 100% 99%    Intake/Output Summary (Last 24 hours) at 05/30/14 1007 Last data filed at 05/30/14 0605  Gross per 24 hour  Intake    440 ml  Output    400 ml  Net     40 ml   Filed Weights   05/26/14 1401 05/26/14 1713  Weight: 61.236 kg (135 lb) 60.963 kg (134 lb 6.4 oz)    Exam:   General:  Awake, in nad  Cardiovascular: regular, s1, s2  Respiratory: normal resp  effort, no wheezing  Abdomen: soft,nondistended  Musculoskeletal: perfused, no clubbing   Data Reviewed: Basic Metabolic Panel:  Recent Labs Lab 05/26/14 1415 05/26/14 1521 05/27/14 0542 05/27/14 0800 05/28/14 0638 05/29/14 0542  NA 131* 131* 134*  --  134* 136  K 2.0* <2.0* 2.9*  --  3.7 3.8  CL 78* 81* 93*  --  101 105  CO2 35* 34* 30  --  27 24  GLUCOSE 112* 113* 103*  --  100* 93  BUN 88* 86* 80*  --  55* 46*  CREATININE 2.20* 2.09* 2.05*  --  1.61* 1.37*  CALCIUM 9.8 9.2 8.9  --  8.6 8.4  MG  --   --   --  2.7*  --   --    Liver Function Tests: No results for input(s): AST, ALT, ALKPHOS, BILITOT, PROT, ALBUMIN in the last 168 hours. No results for input(s): LIPASE, AMYLASE in the last 168 hours. No results for input(s): AMMONIA in the last 168 hours. CBC:  Recent Labs Lab 05/26/14 1415  WBC 7.6  HGB 12.0  HCT 35.1*  MCV 89.8  PLT 272   Cardiac Enzymes: No results for input(s): CKTOTAL, CKMB, CKMBINDEX, TROPONINI in the last 168 hours. BNP (last 3 results) No results for input(s): PROBNP in the last 8760 hours. CBG: No results for input(s): GLUCAP in the last 168 hours.  No  results found for this or any previous visit (from the past 240 hour(s)).   Studies: No results found.  Scheduled Meds: . apixaban  2.5 mg Oral BID  . carvedilol  6.25 mg Oral BID WC  . miconazole nitrate   Topical TID  . pantoprazole  80 mg Oral Daily  . PARoxetine  40 mg Oral q morning - 10a  . polyethylene glycol  17 g Oral Daily  . sodium chloride  3 mL Intravenous Q12H   Continuous Infusions:    Principal Problem:   History of bipolar disorder Active Problems:   Hypokalemia   Fall at home   Acute on chronic renal failure  Time spent: 72min  [facility], [facility] Pager [phone]. If 7PM-7AM, please contact night-coverage at [email or web address], password [facility] 05/30/2014, 10:07 AM  LOS: 4 days

## 2014-05-30 NOTE — Progress Notes (Addendum)
Pt educated on fall prevention. Pt continues to get up without calling and refuses to call. Bed alarm on and chair alarm were on.

## 2014-05-30 NOTE — Progress Notes (Deleted)
[facility] CONSULT NOTE  Patient Care Team: Wardell Honour, MD as PCP - General (Family Medicine) Sanda Klein, MD as Attending Physician (Cardiology)  CHIEF COMPLAINTS/PURPOSE OF CONSULTATION:  History of iron deficiency, folic acid deficiency, borderline low B12 Anemia  HISTORY OF PRESENTING ILLNESS:  Kimberly [name] 81 y.o. female is here because of ***   No history exists.     MEDICAL HISTORY:  Past Medical History  Diagnosis Date  . Depression   . Atrial fibrillation   . Scoliosis   . Osteoarthritis   . Pacemaker     Last saw cards 07/2013  . Fibromyalgia   . Hypertension   . Glaucoma   . Acute diverticulitis 08/24/2013  . CKD (chronic kidney disease) stage 3, GFR 30-59 ml/min 10/12/2012  . Chronic anticoagulation 10/12/2012  . Acute blood loss anemia 10/11/2012  . Antral ulcer 10/11/2012  . Erosive esophagitis 10/11/2012  . Cardiomyopathy, nonischemic   . Acute on chronic combined systolic and diastolic CHF, NYHA class 4 11/15/2013  . Arrhythmia     atrial fibb  . H/O echocardiogram 2007    EF 40-45%,           SURGICAL HISTORY: Past Surgical History  Procedure Laterality Date  . Pacemaker insertion    . Abdominal hysterectomy    . Cholecystectomy    . Appendectomy    . Breast surgery    . Back surgery    . Neck surgery    . Hernia repair      right inguinal hernia and umbilical  . Tonsillectomy    . Esophagogastroduodenoscopy N/A 10/13/2012    Procedure: ESOPHAGOGASTRODUODENOSCOPY (EGD);  Surgeon: Daneil Dolin, MD;  Location: [facility];  Service: Endoscopy;  Laterality: N/A;  . Cystoscopy N/A 02/24/2013    Procedure: CYSTOSCOPY WITH URETHRAL DILITATION;  Surgeon: Marissa Nestle, MD;  Location: [facility];  Service: Urology;  Laterality: N/A;  . Doppler echocardiography N/A 05/30/2010    LV SIZE IS NORMAL. LV SYSTOLIC FUNCTION IS LOW NORMAL. EF=50-55%. MILD INFERIOR HYPOKINESIS.MILD TO MODERATE POSTERIOR WALL HYPOKINESIS.PACEMAKER LEAD IN  THE RV. LA IS MILDLY DILATED. RA IS MODERATE TO SEVERLY DILATED. PACEMAKER LEAD IN THE RA. MILD CALCICICATION OF THE MV APPARATUS. MODERATE MR. MILD TO MODERATE TR. MILD PHTN.AV MILDLY SCLEROTIC.  . Nuclear stress test N/A 02/13/2009    NORMAL PATTERN OF PERFUSION IN ALL REGIONS. POST STRESS VENTICULAR SIZE IS NORMAL. POST  STESS EF 85%.  NORMAL MYOCARDIAL PERFUSION STUDY.  . Cardiac catheterization  12/08/2005    LAD AND LEFT MAIN WITH NO HIGH-GRADE STENOSIS. MILD DISEASE IN THE CX AND LAD SYSTEM. SEVERE LV DYSFUNCTION WITH DILATION OF THE LV. EF 15-20%. LV END-DIASTOLIC PRESSURE IS 90. +1 MR.  Fawn Kirk ext venous Bilateral 11-08-10    R & L- NO EVIDENCE OF THROMBUS OR THROMBOPHLEBITIS. THERE IS MILD AMOUNT OF SUBCUTANEOUS EDEMA NOTED WITHIN THE LEFT CALF AND ANKLE. R & L GSV AND SSV- NO VENOUS INSUFF NOTED.    SOCIAL HISTORY: History   Social History  . Marital Status: Widowed    Spouse Name: N/A    Number of Children: N/A  . Years of Education: N/A   Occupational History  . telephone operator     retired   Social History Main Topics  . Smoking status: Former Smoker -- 0.50 packs/day for 30 years    Types: Cigarettes    Quit date: 12/13/2004  . Smokeless tobacco: Not on file  . Alcohol Use:  0.0 oz/week    0 Glasses of wine per week     Comment: history of drinking a glass of wine in the evening  . Drug Use: No  . Sexual Activity: No   Other Topics Concern  . Not on file   Social History Narrative    FAMILY HISTORY: Family History  Problem Relation Age of Onset  . Cancer Sister   . Asthma Sister   . Heart failure Brother   . Colon cancer Neg Hx    indicated that her mother is deceased. She indicated that her father is deceased. She indicated that her sister is deceased. She indicated that her brother is deceased.   ALLERGIES:  is allergic to iron; iodine; penicillins; and sulfa antibiotics.  MEDICATIONS:  No current facility-administered medications for this visit.    Current Outpatient Prescriptions  Medication Sig Dispense Refill  . furosemide (LASIX) 40 MG tablet Take 1 tablet (40 mg total) by mouth daily. 30 tablet 0   Facility-Administered Medications Ordered in Other Visits  Medication Dose Route Frequency Provider Last Rate Last Dose  . acetaminophen (TYLENOL) tablet 650 mg  650 mg Oral Q6H PRN Tanna Savoy Stinson, DO   650 mg at 05/29/14 1800   Or  . acetaminophen (TYLENOL) suppository 650 mg  650 mg Rectal Q6H PRN Truett Mainland, DO      . alum & mag hydroxide-simeth (MAALOX/MYLANTA) 200-200-20 MG/5ML suspension 30 mL  30 mL Oral Q6H PRN Truett Mainland, DO      . apixaban Arne Cleveland) tablet 2.5 mg  2.5 mg Oral BID Tanna Savoy Stinson, DO   2.5 mg at 05/29/14 2101  . carvedilol (COREG) tablet 6.25 mg  6.25 mg Oral BID WC Tanna Savoy Stinson, DO   6.25 mg at 05/29/14 1706  . docusate sodium (COLACE) capsule 100 mg  100 mg Oral Daily PRN Tanna Savoy Stinson, DO   100 mg at 05/28/14 1850  . HYDROcodone-acetaminophen (NORCO/VICODIN) 5-325 MG per tablet 1 tablet  1 tablet Oral Q6H PRN Truett Mainland, DO   1 tablet at 05/29/14 1800  . miconazole nitrate (MICATIN) topical powder   Topical TID Dianne Dun, NP      . ondansetron [facility]) tablet 4 mg  4 mg Oral Q6H PRN Truett Mainland, DO       Or  . ondansetron [facility]) injection 4 mg  4 mg Intravenous Q6H PRN Tanna Savoy Stinson, DO      . pantoprazole (PROTONIX) EC tablet 80 mg  80 mg Oral Daily Tanna Savoy Stinson, DO   80 mg at 05/29/14 0901  . PARoxetine (PAXIL) tablet 40 mg  40 mg Oral q morning - 10a Tanna Savoy Stinson, DO   40 mg at 05/29/14 0901  . polyethylene glycol (MIRALAX / GLYCOLAX) packet 17 g  17 g Oral Daily Donne Hazel, MD   17 g at 05/29/14 0901  . sodium chloride 0.9 % injection 3 mL  3 mL Intravenous Q12H Tanna Savoy Stinson, DO   3 mL at 05/29/14 2102  . zolpidem (AMBIEN) tablet 5 mg  5 mg Oral QHS PRN Tanna Savoy Stinson, DO   5 mg at 05/29/14 2226    ROS  PHYSICAL EXAMINATION: ECOG PERFORMANCE  STATUS: {CHL ONC ECOG PS:[id number]}  There were no vitals filed for this visit. There were no vitals filed for this visit.   Physical Exam   LABORATORY DATA:  I have reviewed the data as listed Lab Results  Component Value Date   WBC 7.6 05/26/2014   HGB 12.0 05/26/2014   HCT 35.1* 05/26/2014   MCV 89.8 05/26/2014   PLT 272 05/26/2014     Chemistry      Component Value Date/Time   NA 136 05/29/2014 0542   NA 140 04/10/2014 1713   K 3.8 05/29/2014 0542   CL 105 05/29/2014 0542   CO2 24 05/29/2014 0542   BUN 46* 05/29/2014 0542   BUN 21 04/10/2014 1713   CREATININE 1.37* 05/29/2014 0542   CREATININE 1.48* 10/11/2012 1134      Component Value Date/Time   CALCIUM 8.4 05/29/2014 0542   ALKPHOS 61 11/29/2013 1633   AST 16 11/29/2013 1633   ALT 9 11/29/2013 1633   BILITOT 0.4 11/29/2013 1633       RADIOGRAPHIC STUDIES: I have personally reviewed the radiological images as listed and agreed with the findings in the report. Ct Head Wo Contrast  05/26/2014   CLINICAL DATA:  [place] earlier today, did not strike head but has a headache, past history hypertension, atrial fibrillation  EXAM: CT HEAD WITHOUT CONTRAST  TECHNIQUE: Contiguous axial images were obtained from the base of the skull through the vertex without intravenous contrast.  COMPARISON:  11/27/2011  FINDINGS: Mild atrophy.  Normal ventricular morphology.  No midline shift or mass effect.  Normal appearance of brain parenchyma.  No intracranial hemorrhage, mass lesion, or evidence acute infarction.  No extra-axial fluid collections.  Atherosclerotic calcifications at carotid siphons.  Bones is sinuses unremarkable.  IMPRESSION: No acute intracranial abnormalities.   Electronically Signed   By: Lavonia Dana M.D.   On: 05/26/2014 15:13    ASSESSMENT & PLAN:  No problem-specific assessment & plan notes found for this encounter.  No orders of the defined types were placed in this encounter.    All questions were  answered. The patient knows to call the clinic with any problems, questions or concerns. I spent {CHL ONC TIME VISIT - [id number] counseling the patient face to face. The total time spent in the appointment was {CHL ONC TIME VISIT - [id number] and more than 50% was on counseling.     Molli Hazard, MD MD 05/30/2014 8:19 AM

## 2014-05-30 NOTE — Progress Notes (Signed)
Discharge instruction reviewed with patient. Medications returned to patient. No distress noted at discharge. Taken to lobby via wheelchair.

## 2014-05-31 ENCOUNTER — Telehealth: Payer: Self-pay | Admitting: Family Medicine

## 2014-05-31 DIAGNOSIS — I5040 Unspecified combined systolic (congestive) and diastolic (congestive) heart failure: Secondary | ICD-10-CM | POA: Diagnosis not present

## 2014-05-31 DIAGNOSIS — Z9181 History of falling: Secondary | ICD-10-CM | POA: Diagnosis not present

## 2014-05-31 DIAGNOSIS — F319 Bipolar disorder, unspecified: Secondary | ICD-10-CM | POA: Diagnosis not present

## 2014-05-31 DIAGNOSIS — I4891 Unspecified atrial fibrillation: Secondary | ICD-10-CM | POA: Diagnosis not present

## 2014-05-31 DIAGNOSIS — N183 Chronic kidney disease, stage 3 (moderate): Secondary | ICD-10-CM | POA: Diagnosis not present

## 2014-05-31 DIAGNOSIS — I129 Hypertensive chronic kidney disease with stage 1 through stage 4 chronic kidney disease, or unspecified chronic kidney disease: Secondary | ICD-10-CM | POA: Diagnosis not present

## 2014-05-31 NOTE — Telephone Encounter (Signed)
Pt given appt with dr Livia Snellen 2/3/at 8:25.

## 2014-06-01 ENCOUNTER — Ambulatory Visit: Payer: Medicare Other | Admitting: Cardiovascular Disease

## 2014-06-02 ENCOUNTER — Encounter: Payer: Medicare Other | Admitting: Cardiovascular Disease

## 2014-06-02 DIAGNOSIS — Z9181 History of falling: Secondary | ICD-10-CM | POA: Diagnosis not present

## 2014-06-02 DIAGNOSIS — N183 Chronic kidney disease, stage 3 (moderate): Secondary | ICD-10-CM | POA: Diagnosis not present

## 2014-06-02 DIAGNOSIS — I129 Hypertensive chronic kidney disease with stage 1 through stage 4 chronic kidney disease, or unspecified chronic kidney disease: Secondary | ICD-10-CM | POA: Diagnosis not present

## 2014-06-02 DIAGNOSIS — F319 Bipolar disorder, unspecified: Secondary | ICD-10-CM | POA: Diagnosis not present

## 2014-06-02 DIAGNOSIS — I4891 Unspecified atrial fibrillation: Secondary | ICD-10-CM | POA: Diagnosis not present

## 2014-06-02 DIAGNOSIS — I5040 Unspecified combined systolic (congestive) and diastolic (congestive) heart failure: Secondary | ICD-10-CM | POA: Diagnosis not present

## 2014-06-03 ENCOUNTER — Telehealth: Payer: Self-pay | Admitting: Physician Assistant

## 2014-06-03 NOTE — Telephone Encounter (Signed)
Patient called with [name].  No SOB more than usual.  No orthopnea or PND.   Instructed to take additional lasix 20mg  at 1500hrs.  FU in Feb scheduled.  Kimberly [name], PAC

## 2014-06-05 ENCOUNTER — Telehealth: Payer: Self-pay | Admitting: Family Medicine

## 2014-06-05 ENCOUNTER — Telehealth: Payer: Self-pay | Admitting: *Deleted

## 2014-06-05 DIAGNOSIS — Z9181 History of falling: Secondary | ICD-10-CM | POA: Diagnosis not present

## 2014-06-05 DIAGNOSIS — I129 Hypertensive chronic kidney disease with stage 1 through stage 4 chronic kidney disease, or unspecified chronic kidney disease: Secondary | ICD-10-CM | POA: Diagnosis not present

## 2014-06-05 DIAGNOSIS — Z79899 Other long term (current) drug therapy: Secondary | ICD-10-CM

## 2014-06-05 DIAGNOSIS — F319 Bipolar disorder, unspecified: Secondary | ICD-10-CM | POA: Diagnosis not present

## 2014-06-05 DIAGNOSIS — N183 Chronic kidney disease, stage 3 (moderate): Secondary | ICD-10-CM | POA: Diagnosis not present

## 2014-06-05 DIAGNOSIS — I4891 Unspecified atrial fibrillation: Secondary | ICD-10-CM | POA: Diagnosis not present

## 2014-06-05 DIAGNOSIS — I5040 Unspecified combined systolic (congestive) and diastolic (congestive) heart failure: Secondary | ICD-10-CM | POA: Diagnosis not present

## 2014-06-05 MED ORDER — METOLAZONE 2.5 MG PO TABS: ORAL_TABLET | ORAL | Status: DC

## 2014-06-05 NOTE — Telephone Encounter (Signed)
Patient called in c/o bilateral ankle edema she states x 4-5 days.  No change in SOB.  States she is taking Furosemide 60mg  in the AM and 20mg  PM.   Several calls in January with the same complaint. Zaroxolyn 2.5mg  prescribed 05/15/14 but this is not on her med list and she states she has no Rx for this at home.   Discussed with Dr. Stanford Breed: Continue current Furosemide dose. Start Zaroxolyn 2.5mg  30 minutes before am furosemide x 3 days. BMP at the end of the week. Appointment with an extender 06/19/14 1:30pm. Dr. Ammie Ferrier office contacted about med change and order for BMP end of week - she will have done at their office.

## 2014-06-05 NOTE — Telephone Encounter (Signed)
Letter typed and faxed to [facility]

## 2014-06-07 ENCOUNTER — Ambulatory Visit: Payer: Medicare Other | Admitting: Gastroenterology

## 2014-06-07 ENCOUNTER — Telehealth: Payer: Self-pay | Admitting: Family Medicine

## 2014-06-07 ENCOUNTER — Encounter: Payer: Self-pay | Admitting: Family Medicine

## 2014-06-07 ENCOUNTER — Ambulatory Visit (INDEPENDENT_AMBULATORY_CARE_PROVIDER_SITE_OTHER): Payer: Medicare Other | Admitting: Family Medicine

## 2014-06-07 VITALS — BP 110/58 | HR 76 | Temp 97.3°F | Ht 67.0 in | Wt 146.0 lb

## 2014-06-07 DIAGNOSIS — F319 Bipolar disorder, unspecified: Secondary | ICD-10-CM | POA: Diagnosis not present

## 2014-06-07 DIAGNOSIS — N184 Chronic kidney disease, stage 4 (severe): Secondary | ICD-10-CM | POA: Diagnosis not present

## 2014-06-07 DIAGNOSIS — I5040 Unspecified combined systolic (congestive) and diastolic (congestive) heart failure: Secondary | ICD-10-CM | POA: Diagnosis not present

## 2014-06-07 DIAGNOSIS — G8929 Other chronic pain: Secondary | ICD-10-CM | POA: Diagnosis not present

## 2014-06-07 DIAGNOSIS — I5042 Chronic combined systolic (congestive) and diastolic (congestive) heart failure: Secondary | ICD-10-CM

## 2014-06-07 DIAGNOSIS — M545 Low back pain: Secondary | ICD-10-CM | POA: Diagnosis not present

## 2014-06-07 DIAGNOSIS — I509 Heart failure, unspecified: Secondary | ICD-10-CM | POA: Diagnosis not present

## 2014-06-07 DIAGNOSIS — I482 Chronic atrial fibrillation, unspecified: Secondary | ICD-10-CM

## 2014-06-07 DIAGNOSIS — N183 Chronic kidney disease, stage 3 (moderate): Secondary | ICD-10-CM | POA: Diagnosis not present

## 2014-06-07 DIAGNOSIS — Z9181 History of falling: Secondary | ICD-10-CM | POA: Diagnosis not present

## 2014-06-07 DIAGNOSIS — I4891 Unspecified atrial fibrillation: Secondary | ICD-10-CM | POA: Diagnosis not present

## 2014-06-07 DIAGNOSIS — I129 Hypertensive chronic kidney disease with stage 1 through stage 4 chronic kidney disease, or unspecified chronic kidney disease: Secondary | ICD-10-CM | POA: Diagnosis not present

## 2014-06-07 MED ORDER — POTASSIUM CHLORIDE CRYS ER 20 MEQ PO TBCR: 20.0000 meq | EXTENDED_RELEASE_TABLET | Freq: Three times a day (TID) | ORAL | Status: DC

## 2014-06-07 MED ORDER — HYDROCODONE-ACETAMINOPHEN 5-325 MG PO TABS: 1.0000 | ORAL_TABLET | Freq: Four times a day (QID) | ORAL | Status: DC | PRN

## 2014-06-07 MED ORDER — FUROSEMIDE 40 MG PO TABS: ORAL_TABLET | ORAL | Status: DC

## 2014-06-07 MED ORDER — TRAZODONE HCL 50 MG PO TABS: 25.0000 mg | ORAL_TABLET | Freq: Every evening | ORAL | Status: DC | PRN

## 2014-06-07 NOTE — Telephone Encounter (Signed)
Discussed with Dr Livia Snellen. Rx sent in to pharmacy. Patient notified

## 2014-06-07 NOTE — Progress Notes (Signed)
Subjective:    Patient ID: Kimberly [name], female    DOB: June 13, 1926, 81 y.o.   MRN: [id number]  HPI  Patient is here today for a follow up of chronic medical problems which include anemia, hypokalemia, chronic kidney disease, GERD, Atrial fibrillation and CHF. At this time she denies active shortness of breath orthopnea and paroxysmal nocturnal dyspnea. However she does have moderate edema of the lower extremities. Patient has been treated for atrial fibrillation with anticoagulation and rate control. She has renal insufficiency that has led to chronic anemia and both together have led to poor energy/fatigue. These problems have been ongoing and gradually worsening for several years.         Allergies  Allergen Reactions  . Iron Swelling    Ferrous Sulfate - tongue swelling   . Iodine Rash and Other (See Comments)    REACTION:If injected,  Rash/irritated skin reaction "welts"  . Penicillins Hives  . Sulfa Antibiotics Rash    Outpatient Encounter Prescriptions as of 06/07/2014  Medication Sig  . apixaban (ELIQUIS) 2.5 MG TABS tablet Take 1 tablet by mouth  twice a day (Patient taking differently: Take 2.5 mg by mouth 2 (two) times daily. )  . carvedilol (COREG) 6.25 MG tablet Take 1 tablet (6.25 mg total) by mouth 2 (two) times daily with a meal.  . Ferrous Sulfate 27 MG TABS Take 1 tablet by mouth daily.  . furosemide (LASIX) 40 MG tablet Take 1 tablet (40 mg total) by mouth daily.  [name] HYDROcodone-acetaminophen (NORCO/VICODIN) 5-325 MG per tablet Take 1-2 tablets by mouth every 6 (six) hours as needed for moderate pain.  [name] omeprazole (PRILOSEC) 20 MG capsule Take 20 mg by mouth daily.  [name] PARoxetine (PAXIL) 40 MG tablet Take 40 mg by mouth every morning.   . potassium chloride SA (K-DUR,KLOR-CON) 20 MEQ tablet Take 1 tablet (20 mEq total) by mouth 2 (two) times daily.  . [DISCONTINUED] eszopiclone (LUNESTA) 2 MG TABS tablet Take 1 tablet (2 mg total) by mouth at bedtime as needed for  sleep. Take immediately before bedtime (Patient not taking: Reported on 06/07/2014)  . [DISCONTINUED] levofloxacin (LEVAQUIN) 500 MG tablet Take 1 tablet (500 mg total) by mouth daily. (Patient not taking: Reported on 06/07/2014)  . [DISCONTINUED] metolazone (ZAROXOLYN) 2.5 MG tablet Take one tablet 30 minutes before the morning dose of Furosemide x 3 days. (Patient not taking: Reported on 06/07/2014)    Past Medical History  Diagnosis Date  . Depression   . Atrial fibrillation   . Scoliosis   . Osteoarthritis   . Pacemaker     Last saw cards 07/2013  . Fibromyalgia   . Hypertension   . Glaucoma   . Acute diverticulitis 08/24/2013  . CKD (chronic kidney disease) stage 3, GFR 30-59 ml/min 10/12/2012  . Chronic anticoagulation 10/12/2012  . Acute blood loss anemia 10/11/2012  . Antral ulcer 10/11/2012  . Erosive esophagitis 10/11/2012  . Cardiomyopathy, nonischemic   . Acute on chronic combined systolic and diastolic CHF, NYHA class 4 11/15/2013  . Arrhythmia     atrial fibb  . H/O echocardiogram 2007    EF 40-45%,           Past Surgical History  Procedure Laterality Date  . Pacemaker insertion    . Abdominal hysterectomy    . Cholecystectomy    . Appendectomy    . Breast surgery    . Back surgery    . Neck surgery    .  Hernia repair      right inguinal hernia and umbilical  . Tonsillectomy    . Esophagogastroduodenoscopy N/A 10/13/2012    Procedure: ESOPHAGOGASTRODUODENOSCOPY (EGD);  Surgeon: Corbin Ade, MD;  Location: [facility];  Service: Endoscopy;  Laterality: N/A;  . Cystoscopy N/A 02/24/2013    Procedure: CYSTOSCOPY WITH URETHRAL DILITATION;  Surgeon: Ky Barban, MD;  Location: [facility];  Service: Urology;  Laterality: N/A;  . Doppler echocardiography N/A 05/30/2010    LV SIZE IS NORMAL. LV SYSTOLIC FUNCTION IS LOW NORMAL. EF=50-55%. MILD INFERIOR HYPOKINESIS.MILD TO MODERATE POSTERIOR WALL HYPOKINESIS.PACEMAKER LEAD IN THE RV. LA IS MILDLY DILATED. RA IS MODERATE TO  SEVERLY DILATED. PACEMAKER LEAD IN THE RA. MILD CALCICICATION OF THE MV APPARATUS. MODERATE MR. MILD TO MODERATE TR. MILD PHTN.AV MILDLY SCLEROTIC.  . Nuclear stress test N/A 02/13/2009    NORMAL PATTERN OF PERFUSION IN ALL REGIONS. POST STRESS VENTICULAR SIZE IS NORMAL. POST  STESS EF 85%.  NORMAL MYOCARDIAL PERFUSION STUDY.  . Cardiac catheterization  12/08/2005    LAD AND LEFT MAIN WITH NO HIGH-GRADE STENOSIS. MILD DISEASE IN THE CX AND LAD SYSTEM. SEVERE LV DYSFUNCTION WITH DILATION OF THE LV. EF 15-20%. LV END-DIASTOLIC PRESSURE IS 90. +1 MR.  Titus Dubin ext venous Bilateral 11-08-10    R & L- NO EVIDENCE OF THROMBUS OR THROMBOPHLEBITIS. THERE IS MILD AMOUNT OF SUBCUTANEOUS EDEMA NOTED WITHIN THE LEFT CALF AND ANKLE. R & L GSV AND SSV- NO VENOUS INSUFF NOTED.    History   Social History  . Marital Status: Widowed    Spouse Name: N/A    Number of Children: N/A  . Years of Education: N/A   Occupational History  . telephone operator     retired   Social History Main Topics  . Smoking status: Former Smoker -- 0.50 packs/day for 30 years    Types: Cigarettes    Quit date: 12/13/2004  . Smokeless tobacco: Not on file  . Alcohol Use: 0.0 oz/week    0 Glasses of wine per week     Comment: history of drinking a glass of wine in the evening  . Drug Use: No  . Sexual Activity: No   Other Topics Concern  . Not on file   Social History Narrative    Review of Systems  Constitutional: Negative for fever, chills, diaphoresis, appetite change, fatigue and unexpected weight change.  HENT: Negative for congestion, ear pain, hearing loss, postnasal drip, rhinorrhea, sneezing, sore throat and trouble swallowing.   Eyes: Negative for pain.  Respiratory: Negative for cough, chest tightness and shortness of breath.   Cardiovascular: Negative for chest pain and palpitations.  Gastrointestinal: Negative for nausea, vomiting, abdominal pain, diarrhea and constipation.  Genitourinary: Negative for  dysuria, frequency and menstrual problem.  Musculoskeletal: Negative for joint swelling and arthralgias.  Skin: Negative for rash.  Neurological: Negative for dizziness, weakness, numbness and headaches.  Psychiatric/Behavioral: Negative for dysphoric mood and agitation.       Objective:   Physical Exam  Constitutional: She is oriented to person, place, and time. She appears well-developed and well-nourished. No distress.  HENT:  Head: Normocephalic and atraumatic.  Right Ear: External ear normal.  Left Ear: External ear normal.  Nose: Nose normal.  Mouth/Throat: Oropharynx is clear and moist.  Eyes: Conjunctivae and EOM are normal. Pupils are equal, round, and reactive to light.  Neck: Normal range of motion. Neck supple. No thyromegaly present.  Cardiovascular: Normal rate, regular rhythm and normal heart  sounds.   No murmur heard. Pulmonary/Chest: Effort normal. No respiratory distress. She has no wheezes. She has rales (slight basilar rales bilateral). She exhibits no tenderness.  Abdominal: Soft. Bowel sounds are normal. She exhibits no distension. There is no tenderness.  Musculoskeletal: She exhibits edema.  Lymphadenopathy:    She has no cervical adenopathy.  Neurological: She is alert and oriented to person, place, and time. She has normal reflexes. Abnormal coordination: 2+ both lower extremities.  Skin: Skin is warm and dry. She is not diaphoretic.  Psychiatric: She has a normal mood and affect. Her behavior is normal. Judgment and thought content normal.   BP 110/58 mmHg  Pulse 76  Temp(Src) 97.3 F (36.3 C) (Oral)  Ht $R'5\' 7"'sK$  (1.702 m)  Wt 146 lb (66.225 kg)  BMI 22.86 kg/m2        Assessment & Plan:   1. Chronic LBP   2. Chronic kidney disease, stage IV (severe)   3. Chronic atrial fibrillation   4. CHF (congestive heart failure), NYHA class IV, chronic, combined     Meds ordered this encounter  Medications  . DISCONTD: HYDROcodone-acetaminophen  (NORCO/VICODIN) 5-325 MG per tablet    Sig: Take 1-2 tablets by mouth every 6 (six) hours as needed for moderate pain.    Dispense:  60 tablet    Refill:  0  . DISCONTD: HYDROcodone-acetaminophen (NORCO/VICODIN) 5-325 MG per tablet    Sig: Take 1-2 tablets by mouth every 6 (six) hours as needed for moderate pain.    Dispense:  60 tablet    Refill:  0  . HYDROcodone-acetaminophen (NORCO/VICODIN) 5-325 MG per tablet    Sig: Take 1-2 tablets by mouth every 6 (six) hours as needed for moderate pain.    Dispense:  60 tablet    Refill:  0  . potassium chloride SA (K-DUR,KLOR-CON) 20 MEQ tablet    Sig: Take 1 tablet (20 mEq total) by mouth 3 (three) times daily.    Dispense:  270 tablet    Refill:  1  . furosemide (LASIX) 40 MG tablet    Sig: Take three upon arising each morning. Take two more 4 hours later    Dispense:  450 tablet    Refill:  3  . traZODone (DESYREL) 50 MG tablet    Sig: Take 0.5-1 tablets (25-50 mg total) by mouth at bedtime as needed for sleep.    Dispense:  30 tablet    Refill:  3    Orders Placed This Encounter  Procedures  . Brain natriuretic peptide  . BMP8+EGFR    Labs pending Health Maintenance reviewed Diet and exercise encouraged Continue all meds as discussed Follow up in 3 mos.  Claretta Fraise, MD

## 2014-06-08 LAB — BRAIN NATRIURETIC PEPTIDE: BNP: 476.5 pg/mL — ABNORMAL HIGH (ref 0.0–100.0)

## 2014-06-08 LAB — BMP8+EGFR
BUN / CREAT RATIO: 17 (ref 11–26)
BUN: 21 mg/dL (ref 8–27)
CALCIUM: 8.7 mg/dL (ref 8.7–10.3)
CHLORIDE: 103 mmol/L (ref 97–108)
CO2: 20 mmol/L (ref 18–29)
Creatinine, Ser: 1.24 mg/dL — ABNORMAL HIGH (ref 0.57–1.00)
GFR calc Af Amer: 46 mL/min/{1.73_m2} — ABNORMAL LOW (ref >59)
GFR calc non Af Amer: 40 mL/min/{1.73_m2} — ABNORMAL LOW (ref >59)
Glucose: 111 mg/dL — ABNORMAL HIGH (ref 65–99)
Potassium: 4.2 mmol/L (ref 3.5–5.2)
Sodium: 140 mmol/L (ref 134–144)

## 2014-06-09 ENCOUNTER — Telehealth: Payer: Self-pay | Admitting: Family Medicine

## 2014-06-09 NOTE — Telephone Encounter (Signed)
Pt notfied that [facility] has RX and they will deliver  [employer] understanding

## 2014-06-12 ENCOUNTER — Telehealth: Payer: Self-pay | Admitting: Family Medicine

## 2014-06-12 DIAGNOSIS — Z9181 History of falling: Secondary | ICD-10-CM | POA: Diagnosis not present

## 2014-06-12 DIAGNOSIS — F319 Bipolar disorder, unspecified: Secondary | ICD-10-CM | POA: Diagnosis not present

## 2014-06-12 DIAGNOSIS — I4891 Unspecified atrial fibrillation: Secondary | ICD-10-CM | POA: Diagnosis not present

## 2014-06-12 DIAGNOSIS — I5040 Unspecified combined systolic (congestive) and diastolic (congestive) heart failure: Secondary | ICD-10-CM | POA: Diagnosis not present

## 2014-06-12 DIAGNOSIS — N183 Chronic kidney disease, stage 3 (moderate): Secondary | ICD-10-CM | POA: Diagnosis not present

## 2014-06-12 DIAGNOSIS — I129 Hypertensive chronic kidney disease with stage 1 through stage 4 chronic kidney disease, or unspecified chronic kidney disease: Secondary | ICD-10-CM | POA: Diagnosis not present

## 2014-06-12 NOTE — Telephone Encounter (Signed)
That would be fine 

## 2014-06-12 NOTE — Telephone Encounter (Signed)
Patient slept well last night with Trazadone but she didn't sleep at all the night before. She is taking one whole tablet and she wants to know if she can take 2 tablets if necessary. Advised that prescription is for .5 to 1 tablet currently.

## 2014-06-13 NOTE — Progress Notes (Signed)
This encounter was created in error - please disregard.

## 2014-06-13 NOTE — Telephone Encounter (Signed)
Patient aware.

## 2014-06-14 ENCOUNTER — Encounter: Payer: Self-pay | Admitting: Gastroenterology

## 2014-06-14 ENCOUNTER — Telehealth: Payer: Self-pay

## 2014-06-14 ENCOUNTER — Ambulatory Visit (INDEPENDENT_AMBULATORY_CARE_PROVIDER_SITE_OTHER): Payer: Medicare Other | Admitting: Gastroenterology

## 2014-06-14 ENCOUNTER — Other Ambulatory Visit: Payer: Self-pay

## 2014-06-14 VITALS — BP 107/55 | HR 76 | Temp 98.0°F | Ht 65.0 in | Wt 140.2 lb

## 2014-06-14 DIAGNOSIS — K625 Hemorrhage of anus and rectum: Secondary | ICD-10-CM | POA: Diagnosis not present

## 2014-06-14 DIAGNOSIS — K279 Peptic ulcer, site unspecified, unspecified as acute or chronic, without hemorrhage or perforation: Secondary | ICD-10-CM

## 2014-06-14 MED ORDER — PEG 3350-KCL-NA BICARB-NACL 420 G PO SOLR: 4000.0000 mL | Freq: Once | ORAL | Status: DC

## 2014-06-14 NOTE — Assessment & Plan Note (Signed)
81 year old female with low-volume hematochezia in the setting of intermittent constipation, likely benign anorectal source, but with last colonoscopy at [facility] in the remote past. Although she is 60, she is overall healthy for her age and quite independent. Desires further evaluation to rule out colon polyps, malignancy. Doubt occult malignancy.   Proceed with colonoscopy with Dr. Oneida Alar in the near future. The risks, benefits, and alternatives have been discussed in detail with the patient. They state understanding and desire to proceed.  HOLD Eliquis X 48 hours, cleared with cardiology.

## 2014-06-14 NOTE — Telephone Encounter (Signed)
Opened in error

## 2014-06-14 NOTE — Progress Notes (Signed)
Referring Provider: Wardell Honour, MD Primary Care Physician:  Wardell Honour, MD  Primary GI: Dr. Oneida Alar   Chief Complaint  Patient presents with  . Blood In Stools    HPI:   Kimberly [name] is a 81 y.o. female presenting today with a history of PUD on EGD in 2014, no surveillance completed, presenting with hematochezia.   States noted paper hematochezia a few months ago, intermittently, in the presence of straining "hard". Last evidence of low-volume hematochezia about a month ago. Last colonoscopy at [facility] "a long time ago". Intermittent constipation in the past but as long as she drinks enough water and eats enough fruit, she is ok. Notes intermittent lower abdominal discomfort, not associated with eating. Rare vomiting up small amount of liquid. Denies chronic nausea. Occasional indigestion but the majority of the time good. Good appetite. No dysphagia. No unexplained weight loss. No melena.   Past Medical History  Diagnosis Date  . Depression   . Atrial fibrillation   . Scoliosis   . Osteoarthritis   . Pacemaker     Last saw cards 07/2013  . Fibromyalgia   . Hypertension   . Glaucoma   . Acute diverticulitis 08/24/2013  . CKD (chronic kidney disease) stage 3, GFR 30-59 ml/min 10/12/2012  . Chronic anticoagulation 10/12/2012  . Acute blood loss anemia 10/11/2012  . Antral ulcer 10/11/2012  . Erosive esophagitis 10/11/2012  . Cardiomyopathy, nonischemic   . Acute on chronic combined systolic and diastolic CHF, NYHA class 4 11/15/2013  . Arrhythmia     atrial fibb  . H/O echocardiogram 2007    EF 40-45%,           Past Surgical History  Procedure Laterality Date  . Pacemaker insertion    . Abdominal hysterectomy    . Cholecystectomy    . Appendectomy    . Breast surgery    . Back surgery    . Neck surgery    . Hernia repair      right inguinal hernia and umbilical  . Tonsillectomy    . Esophagogastroduodenoscopy N/A 10/13/2012    Dr. Gala Romney: severe ulcerative  reflux esophagitis, question of Barrett's but negative path, single deep prepyloric antral ulcer, negative H.pylori  . Cystoscopy N/A 02/24/2013    Procedure: CYSTOSCOPY WITH URETHRAL DILITATION;  Surgeon: Marissa Nestle, MD;  Location: [facility];  Service: Urology;  Laterality: N/A;  . Doppler echocardiography N/A 05/30/2010    LV SIZE IS NORMAL. LV SYSTOLIC FUNCTION IS LOW NORMAL. EF=50-55%. MILD INFERIOR HYPOKINESIS.MILD TO MODERATE POSTERIOR WALL HYPOKINESIS.PACEMAKER LEAD IN THE RV. LA IS MILDLY DILATED. RA IS MODERATE TO SEVERLY DILATED. PACEMAKER LEAD IN THE RA. MILD CALCICICATION OF THE MV APPARATUS. MODERATE MR. MILD TO MODERATE TR. MILD PHTN.AV MILDLY SCLEROTIC.  . Nuclear stress test N/A 02/13/2009    NORMAL PATTERN OF PERFUSION IN ALL REGIONS. POST STRESS VENTICULAR SIZE IS NORMAL. POST  STESS EF 85%.  NORMAL MYOCARDIAL PERFUSION STUDY.  . Cardiac catheterization  12/08/2005    LAD AND LEFT MAIN WITH NO HIGH-GRADE STENOSIS. MILD DISEASE IN THE CX AND LAD SYSTEM. SEVERE LV DYSFUNCTION WITH DILATION OF THE LV. EF 15-20%. LV END-DIASTOLIC PRESSURE IS 90. +1 MR.  Fawn Kirk ext venous Bilateral 11-08-10    R & L- NO EVIDENCE OF THROMBUS OR THROMBOPHLEBITIS. THERE IS MILD AMOUNT OF SUBCUTANEOUS EDEMA NOTED WITHIN THE LEFT CALF AND ANKLE. R & L GSV AND SSV- NO VENOUS INSUFF NOTED.    Current  Outpatient Prescriptions  Medication Sig Dispense Refill  . apixaban (ELIQUIS) 2.5 MG TABS tablet Take 1 tablet by mouth  twice a day (Patient taking differently: Take 2.5 mg by mouth 2 (two) times daily. ) 180 tablet 0  . carvedilol (COREG) 6.25 MG tablet Take 1 tablet (6.25 mg total) by mouth 2 (two) times daily with a meal. 180 tablet 3  . Ferrous Sulfate 27 MG TABS Take 1 tablet by mouth daily.    . furosemide (LASIX) 40 MG tablet Take three upon arising each morning. Take two more 4 hours later 450 tablet 3  . [START ON 08/06/2014] HYDROcodone-acetaminophen (NORCO/VICODIN) 5-325 MG per tablet Take 1-2  tablets by mouth every 6 (six) hours as needed for moderate pain. 60 tablet 0  . omeprazole (PRILOSEC) 20 MG capsule Take 20 mg by mouth daily.    . potassium chloride SA (K-DUR,KLOR-CON) 20 MEQ tablet Take 1 tablet (20 mEq total) by mouth 3 (three) times daily. 270 tablet 1  . traZODone (DESYREL) 50 MG tablet Take 0.5-1 tablets (25-50 mg total) by mouth at bedtime as needed for sleep. (Patient taking differently: Take 50 mg by mouth at bedtime as needed for sleep. Patient may take 2 tablets at bedtime as needed) 30 tablet 3   No current facility-administered medications for this visit.    Allergies as of 06/14/2014 - Review Complete 06/14/2014  Allergen Reaction Noted  . Iron Swelling 04/10/2014  . Iodine Rash and Other (See Comments) 01/22/2011  . Penicillins Hives 01/22/2011  . Sulfa antibiotics Rash 01/22/2011    Family History  Problem Relation Age of Onset  . Cancer Sister   . Asthma Sister   . Heart failure Brother   . Colon cancer Neg Hx     History   Social History  . Marital Status: Widowed    Spouse Name: N/A  . Number of Children: N/A  . Years of Education: N/A   Occupational History  . telephone operator     retired   Social History Main Topics  . Smoking status: Former Smoker -- 0.50 packs/day for 30 years    Types: Cigarettes    Quit date: 12/13/2004  . Smokeless tobacco: Not on file  . Alcohol Use: 0.0 oz/week    0 Glasses of wine per week     Comment: history of drinking a 1/2 glass of wine in the evening  . Drug Use: No  . Sexual Activity: No   Other Topics Concern  . None   Social History Narrative    Review of Systems: Gen: see HPI CV: Denies chest pain, palpitations, syncope, peripheral edema, and claudication. Resp: +DOE GI: see HPI Derm: +dry skin Psych: Denies depression, anxiety, memory loss, confusion. No homicidal or suicidal ideation.  Heme: see HPI.  Physical Exam: BP 107/55 mmHg  Pulse 76  Temp(Src) 98 F (36.7 C) (Oral)   Ht 5\' 5"  (1.651 m)  Wt 140 lb 3.2 oz (63.594 kg)  BMI 23.33 kg/m2 General:   Alert and oriented. No distress noted. Pleasant and cooperative.  Head:  Normocephalic and atraumatic. Eyes:  Conjuctiva clear without scleral icterus. Mouth:  Oral mucosa pink and moist.  Heart:  S1, S2 present, irregularly irregular Abdomen:  +BS, soft, non-tender and non-distended. Query small umbilical hernia Msk:  Kyphosis, scoliosis, contractured arthritic hands Extremities:  Without edema. Neurologic:  Alert and  oriented x4;  grossly normal neurologically. Skin:  Intact without significant lesions or rashes. Psych:  Alert and cooperative. Normal mood  and affect.

## 2014-06-14 NOTE — Assessment & Plan Note (Signed)
Noted on EGD in 2014. No surveillance completed. Proceed with surveillance at time of colonoscopy.   Proceed with upper endoscopy in the near future with Dr. Oneida Alar. The risks, benefits, and alternatives have been discussed in detail with patient. They have stated understanding and desire to proceed.  Hold Eliquis X 48 hours, cleared by cardiology.

## 2014-06-14 NOTE — Telephone Encounter (Signed)
-----   Message from Sanda Klein, MD sent at 06/14/2014 11:29 AM EST ----- Yes, OK to hold Eliquis (although 48 hours usually enough). She has not had AFib in a long time and her pacemaker would record it if she had.  Sanda Klein, MD, [facility] [phone] office [phone] pager  ----- Message -----    From: Marylou Mccoy, LPN    Sent: [id number]  10:18 AM      To: Sanda Klein, MD  Dr.Croitoru-   Hello, your patient Kimberly [name] was seen in our office today for blood in her stool and peptic ulcers. She is being scheduled for a colonoscopy and an EGD.   We need to know if its ok to hold her eliquis for 3 days prior to her procedure?  Please advise.  Thank you,  Burnadette Peter LPN [facility].

## 2014-06-14 NOTE — Telephone Encounter (Signed)
Candy, please see note below from Dr.Croitoru. Please add hold eliquis for 48 hours to her instructions.

## 2014-06-14 NOTE — Patient Instructions (Signed)
We have scheduled you for a colonoscopy and upper endoscopy with Dr. Oneida Alar in the near future.  I will ask your cardiologist if we can stop the Eliquis 3 days before. We will call you with the final recommendations shortly.

## 2014-06-14 NOTE — Telephone Encounter (Signed)
Noted... Pt notified via mail No phone active at this time

## 2014-06-15 DIAGNOSIS — I4891 Unspecified atrial fibrillation: Secondary | ICD-10-CM | POA: Diagnosis not present

## 2014-06-15 DIAGNOSIS — I5040 Unspecified combined systolic (congestive) and diastolic (congestive) heart failure: Secondary | ICD-10-CM | POA: Diagnosis not present

## 2014-06-15 DIAGNOSIS — Z9181 History of falling: Secondary | ICD-10-CM | POA: Diagnosis not present

## 2014-06-15 DIAGNOSIS — I129 Hypertensive chronic kidney disease with stage 1 through stage 4 chronic kidney disease, or unspecified chronic kidney disease: Secondary | ICD-10-CM | POA: Diagnosis not present

## 2014-06-15 DIAGNOSIS — N183 Chronic kidney disease, stage 3 (moderate): Secondary | ICD-10-CM | POA: Diagnosis not present

## 2014-06-15 DIAGNOSIS — F319 Bipolar disorder, unspecified: Secondary | ICD-10-CM | POA: Diagnosis not present

## 2014-06-16 ENCOUNTER — Ambulatory Visit (HOSPITAL_COMMUNITY): Payer: Self-pay | Admitting: Hematology & Oncology

## 2014-06-19 ENCOUNTER — Ambulatory Visit: Payer: Medicare Other | Admitting: Cardiology

## 2014-06-24 NOTE — Progress Notes (Signed)
CC'ED TO PCP 

## 2014-06-26 ENCOUNTER — Telehealth: Payer: Self-pay | Admitting: Family Medicine

## 2014-06-27 ENCOUNTER — Other Ambulatory Visit: Payer: Self-pay | Admitting: Family

## 2014-06-28 NOTE — Telephone Encounter (Signed)
Pt notified of RXs on file at [facility] understanding

## 2014-06-30 ENCOUNTER — Telehealth: Payer: Self-pay | Admitting: *Deleted

## 2014-06-30 ENCOUNTER — Ambulatory Visit (INDEPENDENT_AMBULATORY_CARE_PROVIDER_SITE_OTHER): Payer: Medicare Other | Admitting: Cardiovascular Disease

## 2014-06-30 ENCOUNTER — Encounter: Payer: Self-pay | Admitting: Cardiovascular Disease

## 2014-06-30 VITALS — BP 144/75 | HR 70 | Resp 16 | Ht 65.0 in | Wt 140.6 lb

## 2014-06-30 DIAGNOSIS — I482 Chronic atrial fibrillation: Secondary | ICD-10-CM | POA: Diagnosis not present

## 2014-06-30 DIAGNOSIS — Z950 Presence of cardiac pacemaker: Secondary | ICD-10-CM | POA: Diagnosis not present

## 2014-06-30 DIAGNOSIS — N183 Chronic kidney disease, stage 3 unspecified: Secondary | ICD-10-CM

## 2014-06-30 DIAGNOSIS — I442 Atrioventricular block, complete: Secondary | ICD-10-CM | POA: Diagnosis not present

## 2014-06-30 DIAGNOSIS — I4821 Permanent atrial fibrillation: Secondary | ICD-10-CM

## 2014-06-30 DIAGNOSIS — R0602 Shortness of breath: Secondary | ICD-10-CM

## 2014-06-30 DIAGNOSIS — I5043 Acute on chronic combined systolic (congestive) and diastolic (congestive) heart failure: Secondary | ICD-10-CM

## 2014-06-30 DIAGNOSIS — I5033 Acute on chronic diastolic (congestive) heart failure: Secondary | ICD-10-CM

## 2014-06-30 NOTE — Telephone Encounter (Signed)
Patient notified Dr. Loletha Grayer wants her to have an echocardiogram.  Would like to have it done at [facility] for convenience.  Order changed and she would like it done if at all possible on 07/06/14.  She has an appt at the cancer center at 2:15pm.

## 2014-06-30 NOTE — Telephone Encounter (Signed)
Called to discuss meds, somewhat confused when she was here for her appointment today.  States she is taking furosemide 40mg  one tablet in the AM and is NOT taking anything 30 minutes prior to the Furosemide.  Reviewed with Dr. Loletha Grayer and she is to continue with current meds.  Patient voiced understanding.

## 2014-06-30 NOTE — Progress Notes (Signed)
Patient ID: Kimberly [name], female   DOB: 06-11-1926, 81 y.o.   MRN: [id number]      Reason for office visit Permanent atrial fibrillation, complete heart block, pacemaker check, diastolic heart failure  Kimberly [name] was hospitalized in late January 2016 after a fall at home and was found to have hypokalemia and acute on chronic renal failure. She improved quickly with intravenous fluids the discharge records state that it was recommended Lasix be resumed at 40 mg daily, but she is back to taking 3 tablets (120 mg in (every morning. It was also recommended that she stop metolazone. It was not clear to me whether she is taking this medicine anymore. She seems to be confused about the medications that she takes but has been set up in a box at home. She lives alone at home and has a daughter that lives in [place].  The patient called [place] back after returning home and confirmed that she is taking furosemide 40 mg daily and that she is no longer taking metolazone. Laboratory tests performed on February 3 showed a creatinine of 1.24, potassium of 4.2 and a BNP of 476.  She was also hospitalized in [facility] in early December for anemia and received 2 units of packed red blood cell transfusion, but there was no clear evidence of gastrointestinal bleeding.  Kimberly [name] has a long-standing history of permanent atrial fibrillation s/p AV node ablation, complete heart block and nonischemic cardiomyopathy with an excellent response to cardiac resynchronization therapy. She originally had a CRT-D device but at the time of generator change out was "downgraded" to a CRT-P. since her left ventricular ejection fraction increased to 50-55%. Her current device is a Risk analyst CRT-P [id number] implanted in June 2012. Her defibrillator lead, which was a [employer] fidelis lead is capped and an older RV pacemaker lead Medtronic [id number] implanted 1997 is being used. The left ventricular lead is a Medtronic [id number] implanted in  2007. Her most recent echocardiogram was performed in 2012 and showed left ventricular ejection fraction of 50-55%, biatrial dilatation, moderate mitral regurgitation, moderate tricuspid regurgitation. She has long-standing problems with chronic scoliosis and fibromyalgia.   Pacemaker interrogation today shows normal device function. She has 100% biventricular pacing. The device is programmed VVIR (permanent atrial fibrillation). The thoracic impedance has been extremely erratic. In late November early December it dropped precipitously consistent with volume overload and then rebounded towards the end of December. In January there was a very sharp increase in thoracic impedance, possibly dehydration followed by a precipitous drop. This does coincide temporally with her hospitalization for dehydration and acute renal failure. Recently, there was a tendency for the thoracic impedance to fall again but it has rebounded over the last couple of days. An episode of rapid ventricular rate at about 150 bpm was recorded on November 5 and probably represents nonsustained ventricular tachycardia. It was fairly lengthy consisting of about 25 beats. No ventricular arrhythmias have been recorded since then.   Allergies  Allergen Reactions  . Iron Swelling    Ferrous Sulfate - tongue swelling   . Iodine Rash and Other (See Comments)    REACTION:If injected,  Rash/irritated skin reaction "welts"  . Penicillins Hives  . Sulfa Antibiotics Rash    Current Outpatient Prescriptions  Medication Sig Dispense Refill  . apixaban (ELIQUIS) 2.5 MG TABS tablet Take 1 tablet by mouth  twice a day (Patient taking differently: Take 2.5 mg by mouth 2 (two) times daily. ) 180 tablet 0  .  carvedilol (COREG) 6.25 MG tablet Take 1 tablet (6.25 mg total) by mouth 2 (two) times daily with a meal. 180 tablet 3  . Ferrous Sulfate 27 MG TABS Take 1 tablet by mouth daily.    . furosemide (LASIX) 40 MG tablet Take three upon arising each  morning. Take two more 4 hours later (Patient taking differently: 40 mg. Take one upon arising each morning. Take three more 2 hours later) 450 tablet 3  . [START ON 08/06/2014] HYDROcodone-acetaminophen (NORCO/VICODIN) 5-325 MG per tablet Take 1-2 tablets by mouth every 6 (six) hours as needed for moderate pain. 60 tablet 0  . omeprazole (PRILOSEC) 20 MG capsule Take 20 mg by mouth daily.    . polyethylene glycol-electrolytes (NULYTELY/GOLYTELY) 420 G solution Take 4,000 mLs by mouth once. 4000 mL 0  . potassium chloride SA (K-DUR,KLOR-CON) 20 MEQ tablet Take 1 tablet (20 mEq total) by mouth 3 (three) times daily. 270 tablet 1  . traZODone (DESYREL) 50 MG tablet Take 0.5-1 tablets (25-50 mg total) by mouth at bedtime as needed for sleep. (Patient taking differently: Take 50 mg by mouth at bedtime as needed for sleep. Patient may take 2 tablets at bedtime as needed) 30 tablet 3   No current facility-administered medications for this visit.    Past Medical History  Diagnosis Date  . Depression   . Atrial fibrillation   . Scoliosis   . Osteoarthritis   . Pacemaker     Last saw cards 07/2013  . Fibromyalgia   . Hypertension   . Glaucoma   . Acute diverticulitis 08/24/2013  . CKD (chronic kidney disease) stage 3, GFR 30-59 ml/min 10/12/2012  . Chronic anticoagulation 10/12/2012  . Acute blood loss anemia 10/11/2012  . Antral ulcer 10/11/2012  . Erosive esophagitis 10/11/2012  . Cardiomyopathy, nonischemic   . Acute on chronic combined systolic and diastolic CHF, NYHA class 4 11/15/2013  . Arrhythmia     atrial fibb  . H/O echocardiogram 2007    EF 40-45%,           Past Surgical History  Procedure Laterality Date  . Pacemaker insertion    . Abdominal hysterectomy    . Cholecystectomy    . Appendectomy    . Breast surgery    . Back surgery    . Neck surgery    . Hernia repair      right inguinal hernia and umbilical  . Tonsillectomy    . Esophagogastroduodenoscopy N/A 10/13/2012    Dr.  Gala Romney: severe ulcerative reflux esophagitis, question of Barrett's but negative path, single deep prepyloric antral ulcer, negative H.pylori  . Cystoscopy N/A 02/24/2013    Procedure: CYSTOSCOPY WITH URETHRAL DILITATION;  Surgeon: Marissa Nestle, MD;  Location: [facility];  Service: Urology;  Laterality: N/A;  . Doppler echocardiography N/A 05/30/2010    LV SIZE IS NORMAL. LV SYSTOLIC FUNCTION IS LOW NORMAL. EF=50-55%. MILD INFERIOR HYPOKINESIS.MILD TO MODERATE POSTERIOR WALL HYPOKINESIS.PACEMAKER LEAD IN THE RV. LA IS MILDLY DILATED. RA IS MODERATE TO SEVERLY DILATED. PACEMAKER LEAD IN THE RA. MILD CALCICICATION OF THE MV APPARATUS. MODERATE MR. MILD TO MODERATE TR. MILD PHTN.AV MILDLY SCLEROTIC.  . Nuclear stress test N/A 02/13/2009    NORMAL PATTERN OF PERFUSION IN ALL REGIONS. POST STRESS VENTICULAR SIZE IS NORMAL. POST  STESS EF 85%.  NORMAL MYOCARDIAL PERFUSION STUDY.  . Cardiac catheterization  12/08/2005    LAD AND LEFT MAIN WITH NO HIGH-GRADE STENOSIS. MILD DISEASE IN THE CX AND LAD SYSTEM. SEVERE LV  DYSFUNCTION WITH DILATION OF THE LV. EF 15-20%. LV END-DIASTOLIC PRESSURE IS 90. +1 MR.  Fawn Kirk ext venous Bilateral 11-08-10    R & L- NO EVIDENCE OF THROMBUS OR THROMBOPHLEBITIS. THERE IS MILD AMOUNT OF SUBCUTANEOUS EDEMA NOTED WITHIN THE LEFT CALF AND ANKLE. R & L GSV AND SSV- NO VENOUS INSUFF NOTED.    Family History  Problem Relation Age of Onset  . Cancer Sister   . Asthma Sister   . Heart failure Brother   . Colon cancer Neg Hx     History   Social History  . Marital Status: Widowed    Spouse Name: N/A  . Number of Children: N/A  . Years of Education: N/A   Occupational History  . telephone operator     retired   Social History Main Topics  . Smoking status: Former Smoker -- 0.50 packs/day for 30 years    Types: Cigarettes    Quit date: 12/13/2004  . Smokeless tobacco: Not on file  . Alcohol Use: 0.0 oz/week    0 Glasses of wine per week     Comment: history of  drinking a 1/2 glass of wine in the evening  . Drug Use: No  . Sexual Activity: No   Other Topics Concern  . Not on file   Social History Narrative    Review of systems: The patient specifically denies any chest pain at rest or with exertion, dyspnea at rest or with exertion, orthopnea, paroxysmal nocturnal dyspnea, syncope, palpitations, focal neurological deficits, intermittent claudication, lower extremity edema, unexplained weight gain, cough, hemoptysis or wheezing.  The patient also denies abdominal pain, nausea, vomiting, dysphagia, diarrhea, constipation, polyuria, polydipsia, dysuria, hematuria, frequency, urgency, abnormal bleeding or bruising, fever, chills, unexpected weight changes, mood swings, change in skin or hair texture, change in voice quality, auditory or visual problems, allergic reactions or rashes, new musculoskeletal complaints other than usual "aches and pains".   PHYSICAL EXAM BP 144/75 mmHg  Pulse 70  Resp 16  Ht 5\' 5"  (1.651 m)  Wt 140 lb 9.6 oz (63.776 kg)  BMI 23.40 kg/m2 General: Alert, oriented x3, no distress,  prominent kyphoscoliosis Head: no evidence of trauma, PERRL, EOMI, no exophtalmos or lid lag, no myxedema, no xanthelasma; normal ears, nose and oropharynx Neck: normal jugular venous pulsations and no hepatojugular reflux; brisk carotid pulses without delay and no carotid bruits Chest: clear to auscultation, no signs of consolidation by percussion or palpation, normal fremitus, symmetrical and full respiratory excursions, subclavian pacemaker site appears healthy Cardiovascular: normal position and quality of the apical impulse, regular rhythm, normal first and paradoxically split second heart sounds, no murmurs, rubs or gallops Abdomen: no tenderness or distention, no masses by palpation, no abnormal pulsatility or arterial bruits, normal bowel sounds, no hepatosplenomegaly Extremities: no clubbing, cyanosis or edema; 2+ radial, ulnar and  brachial pulses bilaterally; 2+ right femoral, posterior tibial and dorsalis pedis pulses; 2+ left femoral, posterior tibial and dorsalis pedis pulses; no subclavian or femoral bruits Neurological: grossly nonfocal   Lipid Panel     Component Value Date/Time   CHOL  02/06/2009 0450    181        ATP III CLASSIFICATION:  <200     mg/dL   Desirable  200-239  mg/dL   Borderline High  >=240    mg/dL   High          TRIG 81 02/06/2009 0450   HDL 78 02/06/2009 0450   CHOLHDL 2.3 02/06/2009 0450  VLDL 16 02/06/2009 0450   LDLCALC  02/06/2009 0450    87        Total Cholesterol/HDL:CHD Risk Coronary Heart Disease Risk Table                     Men   Women  1/2 Average Risk   3.4   3.3  Average Risk       5.0   4.4  2 X Average Risk   9.6   7.1  3 X Average Risk  23.4   11.0        Use the calculated Patient Ratio above and the CHD Risk Table to determine the patient's CHD Risk.        ATP III CLASSIFICATION (LDL):  <100     mg/dL   Optimal  100-129  mg/dL   Near or Above                    Optimal  130-159  mg/dL   Borderline  160-189  mg/dL   High  >190     mg/dL   Very High    BMET    Component Value Date/Time   NA 140 06/07/2014 0931   NA 136 05/29/2014 0542   K 4.2 06/07/2014 0931   CL 103 06/07/2014 0931   CO2 20 06/07/2014 0931   GLUCOSE 111* 06/07/2014 0931   GLUCOSE 93 05/29/2014 0542   BUN 21 06/07/2014 0931   BUN 46* 05/29/2014 0542   CREATININE 1.24* 06/07/2014 0931   CREATININE 1.48* 10/11/2012 1134   CALCIUM 8.7 06/07/2014 0931   GFRNONAA 40* 06/07/2014 0931   GFRNONAA 33* 10/11/2012 1134   GFRAA 46* 06/07/2014 0931   GFRAA 37* 10/11/2012 1134     ASSESSMENT AND PLAN  Biventricular cardiac pacemaker - Medtronic Consulta Normal device function. No programming changes were made. Thoracic impedance (Optivol) has been extremely erratic. I'm concerned with the fact that she lives alone and may not be always taking her medicines appropriately.  Continue remote pacemaker downloads every 3 months  Chronic heart failure, predominantly diastolic After initiation of BiV pacing her LV ejection fraction had improved to about 50%. Recently she has had wide oscillations between edema/dyspnea and dehydration/renal insufficiency. I think we need to reevaluate her left ventricular function by echocardiography. Erratic volume status may be related to her social situation. Reminded her about the importance of daily weight monitoring has an early warning for heart failure exacerbation.  Permanent atrial fibrillation On apixaban  Complete heart block s/p AV node ablation She is pacemaker dependent  Nonsustained VT This was a fairly long episode that occurred in November. She does not recall any symptoms of syncope or presyncope back then.  Holli Humbles, MD, [place] [phone] office [phone] pager

## 2014-06-30 NOTE — Patient Instructions (Signed)
PLEASE call with the strength of Furosemide you are taking daily and what medication you are taking 30 minutes before the Furosemide.  Remote monitoring is used to monitor your Pacemaker from home. This monitoring reduces the number of office visits required to check your device to one time per year. It allows [place] to monitor the functioning of your device to ensure it is working properly. You are scheduled for a device check from home on Sep 29, 2014. You may send your transmission at any time that day. If you have a wireless device, the transmission will be sent automatically. After your physician reviews your transmission, you will receive a postcard with your next transmission date.  Dr. Sallyanne Kuster recommends that you schedule a follow-up appointment in: 6 MONTHS.

## 2014-07-03 ENCOUNTER — Other Ambulatory Visit: Payer: Self-pay | Admitting: Gastroenterology

## 2014-07-03 ENCOUNTER — Encounter: Payer: Medicare Other | Admitting: *Deleted

## 2014-07-03 ENCOUNTER — Telehealth: Payer: Self-pay | Admitting: Cardiology

## 2014-07-03 ENCOUNTER — Encounter: Payer: Self-pay | Admitting: *Deleted

## 2014-07-03 LAB — MDC_IDC_ENUM_SESS_TYPE_REMOTE
Battery Voltage: 3 V
Brady Statistic AP VP Percent: 0 %
Brady Statistic AS VS Percent: 0 %
Brady Statistic RA Percent Paced: 0 %
Brady Statistic RV Percent Paced: 100 %
Lead Channel Impedance Value: 4047 Ohm
Lead Channel Impedance Value: 437 Ohm
Lead Channel Impedance Value: 589 Ohm
Lead Channel Impedance Value: 627 Ohm
Lead Channel Impedance Value: 646 Ohm
Lead Channel Impedance Value: 665 Ohm
Lead Channel Pacing Threshold Amplitude: 0.75 V
Lead Channel Sensing Intrinsic Amplitude: 9.5 mV
Lead Channel Setting Pacing Amplitude: 1.75 V
Lead Channel Setting Pacing Amplitude: 2 V
Lead Channel Setting Pacing Pulse Width: 0.4 ms
Lead Channel Setting Pacing Pulse Width: 1 ms
MDC IDC MSMT BATTERY REMAINING LONGEVITY: 54 mo
MDC IDC MSMT LEADCHNL LV IMPEDANCE VALUE: 456 Ohm
MDC IDC MSMT LEADCHNL LV IMPEDANCE VALUE: 779 Ohm
MDC IDC MSMT LEADCHNL LV PACING THRESHOLD PULSEWIDTH: 1 ms
MDC IDC MSMT LEADCHNL RA IMPEDANCE VALUE: 4047 Ohm
MDC IDC MSMT LEADCHNL RV PACING THRESHOLD AMPLITUDE: 0.625 V
MDC IDC MSMT LEADCHNL RV PACING THRESHOLD PULSEWIDTH: 0.4 ms
MDC IDC MSMT LEADCHNL RV SENSING INTR AMPL: 9.5 mV
MDC IDC SESS DTM: 20160226204900
MDC IDC SET LEADCHNL RV SENSING SENSITIVITY: 2.8 mV
MDC IDC SET ZONE DETECTION INTERVAL: 400 ms
MDC IDC STAT BRADY AP VS PERCENT: 0 %
MDC IDC STAT BRADY AS VP PERCENT: 100 %
Zone Setting Detection Interval: 150 ms

## 2014-07-03 NOTE — Telephone Encounter (Signed)
Confirmed remote transmission w/ pt caregiver.   

## 2014-07-04 ENCOUNTER — Other Ambulatory Visit (HOSPITAL_COMMUNITY): Payer: Medicare Other

## 2014-07-04 DIAGNOSIS — H179 Unspecified corneal scar and opacity: Secondary | ICD-10-CM | POA: Diagnosis not present

## 2014-07-04 DIAGNOSIS — H3531 Nonexudative age-related macular degeneration: Secondary | ICD-10-CM | POA: Diagnosis not present

## 2014-07-04 DIAGNOSIS — Z961 Presence of intraocular lens: Secondary | ICD-10-CM | POA: Diagnosis not present

## 2014-07-06 ENCOUNTER — Ambulatory Visit (HOSPITAL_COMMUNITY): Payer: Self-pay | Admitting: Hematology & Oncology

## 2014-07-06 ENCOUNTER — Encounter (HOSPITAL_COMMUNITY): Payer: Self-pay | Admitting: Lab

## 2014-07-06 NOTE — OR Nursing (Signed)
Patient cancelled procedures due to not having anybody to be with her tomorrow. Kimberly [name] at Dr. Oneida Alar office notified.

## 2014-07-06 NOTE — Progress Notes (Deleted)
Kimberly [name] NOTE  Patient Care Team: Wardell Honour, MD as PCP - General (Family Medicine) Sanda Klein, MD as Attending Physician (Cardiology) Daneil Dolin, MD as Consulting Physician (Gastroenterology)  CHIEF COMPLAINTS/PURPOSE OF CONSULTATION:  ***  HISTORY OF PRESENTING ILLNESS:  Kimberly [name] 81 y.o. female is here because of ***   No history exists.     MEDICAL HISTORY:  Past Medical History  Diagnosis Date  . Depression   . Atrial fibrillation   . Scoliosis   . Osteoarthritis   . Pacemaker     Last saw cards 07/2013  . Fibromyalgia   . Hypertension   . Glaucoma   . Acute diverticulitis 08/24/2013  . CKD (chronic kidney disease) stage 3, GFR 30-59 ml/min 10/12/2012  . Chronic anticoagulation 10/12/2012  . Acute blood loss anemia 10/11/2012  . Antral ulcer 10/11/2012  . Erosive esophagitis 10/11/2012  . Cardiomyopathy, nonischemic   . Acute on chronic combined systolic and diastolic CHF, NYHA class 4 11/15/2013  . Arrhythmia     atrial fibb  . H/O echocardiogram 2007    EF 40-45%,           SURGICAL HISTORY: Past Surgical History  Procedure Laterality Date  . Pacemaker insertion    . Abdominal hysterectomy    . Cholecystectomy    . Appendectomy    . Breast surgery    . Back surgery    . Neck surgery    . Hernia repair      right inguinal hernia and umbilical  . Tonsillectomy    . Esophagogastroduodenoscopy N/A 10/13/2012    Dr. Gala Romney: severe ulcerative reflux esophagitis, question of Barrett's but negative path, single deep prepyloric antral ulcer, negative H.pylori  . Cystoscopy N/A 02/24/2013    Procedure: CYSTOSCOPY WITH URETHRAL DILITATION;  Surgeon: Marissa Nestle, MD;  Location: [facility];  Service: Urology;  Laterality: N/A;  . Doppler echocardiography N/A 05/30/2010    LV SIZE IS NORMAL. LV SYSTOLIC FUNCTION IS LOW NORMAL. EF=50-55%. MILD INFERIOR HYPOKINESIS.MILD TO MODERATE POSTERIOR WALL HYPOKINESIS.PACEMAKER LEAD IN THE  RV. LA IS MILDLY DILATED. RA IS MODERATE TO SEVERLY DILATED. PACEMAKER LEAD IN THE RA. MILD CALCICICATION OF THE MV APPARATUS. MODERATE MR. MILD TO MODERATE TR. MILD PHTN.AV MILDLY SCLEROTIC.  . Nuclear stress test N/A 02/13/2009    NORMAL PATTERN OF PERFUSION IN ALL REGIONS. POST STRESS VENTICULAR SIZE IS NORMAL. POST  STESS EF 85%.  NORMAL MYOCARDIAL PERFUSION STUDY.  . Cardiac catheterization  12/08/2005    LAD AND LEFT MAIN WITH NO HIGH-GRADE STENOSIS. MILD DISEASE IN THE CX AND LAD SYSTEM. SEVERE LV DYSFUNCTION WITH DILATION OF THE LV. EF 15-20%. LV END-DIASTOLIC PRESSURE IS 90. +1 MR.  Fawn Kirk ext venous Bilateral 11-08-10    R & L- NO EVIDENCE OF THROMBUS OR THROMBOPHLEBITIS. THERE IS MILD AMOUNT OF SUBCUTANEOUS EDEMA NOTED WITHIN THE LEFT CALF AND ANKLE. R & L GSV AND SSV- NO VENOUS INSUFF NOTED.    SOCIAL HISTORY: History   Social History  . Marital Status: Widowed    Spouse Name: N/A  . Number of Children: N/A  . Years of Education: N/A   Occupational History  . telephone operator     retired   Social History Main Topics  . Smoking status: Former Smoker -- 0.50 packs/day for 30 years    Types: Cigarettes    Quit date: 12/13/2004  . Smokeless tobacco: Not on file  . Alcohol Use: 0.0 oz/week  0 Glasses of wine per week     Comment: history of drinking a 1/2 glass of wine in the evening  . Drug Use: No  . Sexual Activity: No   Other Topics Concern  . Not on file   Social History Narrative    FAMILY HISTORY: Family History  Problem Relation Age of Onset  . Cancer Sister   . Asthma Sister   . Heart failure Brother   . Colon cancer Neg Hx    indicated that her mother is deceased. She indicated that her father is deceased. She indicated that her sister is deceased. She indicated that her brother is deceased.   ALLERGIES:  is allergic to iron; iodine; penicillins; and sulfa antibiotics.  MEDICATIONS:  Current Outpatient Prescriptions  Medication Sig Dispense  Refill  . apixaban (ELIQUIS) 2.5 MG TABS tablet Take 1 tablet by mouth  twice a day (Patient taking differently: Take 2.5 mg by mouth 2 (two) times daily. ) 180 tablet 0  . carvedilol (COREG) 6.25 MG tablet Take 1 tablet (6.25 mg total) by mouth 2 (two) times daily with a meal. 180 tablet 3  . Ferrous Sulfate 27 MG TABS Take 1 tablet by mouth daily.    . furosemide (LASIX) 40 MG tablet Take 40 mg by mouth daily.    Derrill Memo ON 08/06/2014] HYDROcodone-acetaminophen (NORCO/VICODIN) 5-325 MG per tablet Take 1-2 tablets by mouth every 6 (six) hours as needed for moderate pain. 60 tablet 0  . omeprazole (PRILOSEC) 20 MG capsule Take 20 mg by mouth daily.    . polyethylene glycol-electrolytes (NULYTELY/GOLYTELY) 420 G solution Take 4,000 mLs by mouth once. 4000 mL 0  . potassium chloride SA (K-DUR,KLOR-CON) 20 MEQ tablet Take 1 tablet (20 mEq total) by mouth 3 (three) times daily. 270 tablet 1  . traZODone (DESYREL) 50 MG tablet Take 0.5-1 tablets (25-50 mg total) by mouth at bedtime as needed for sleep. (Patient taking differently: Take 50 mg by mouth at bedtime as needed for sleep. Patient may take 2 tablets at bedtime as needed) 30 tablet 3   No current facility-administered medications for this visit.    ROS  PHYSICAL EXAMINATION: ECOG PERFORMANCE STATUS: {CHL ONC ECOG PS:[phone]}  There were no vitals filed for this visit. There were no vitals filed for this visit.   Physical Exam   LABORATORY DATA:  I have reviewed the data as listed Lab Results  Component Value Date   WBC 7.6 05/26/2014   HGB 12.0 05/26/2014   HCT 35.1* 05/26/2014   MCV 89.8 05/26/2014   PLT 272 05/26/2014     Chemistry      Component Value Date/Time   NA 140 06/07/2014 0931   NA 136 05/29/2014 0542   K 4.2 06/07/2014 0931   CL 103 06/07/2014 0931   CO2 20 06/07/2014 0931   BUN 21 06/07/2014 0931   BUN 46* 05/29/2014 0542   CREATININE 1.24* 06/07/2014 0931   CREATININE 1.48* 10/11/2012 1134        Component Value Date/Time   CALCIUM 8.7 06/07/2014 0931   ALKPHOS 61 11/29/2013 1633   AST 16 11/29/2013 1633   ALT 9 11/29/2013 1633   BILITOT 0.4 11/29/2013 1633       RADIOGRAPHIC STUDIES: I have personally reviewed the radiological images as listed and agreed with the findings in the report. No results found.  ASSESSMENT & PLAN:  No problem-specific assessment & plan notes found for this encounter.  No orders of the defined types  were placed in this encounter.    All questions were answered. The patient knows to call the clinic with any problems, questions or concerns.  I spent {CHL ONC TIME VISIT - [id number] counseling the patient face to face. The total time spent in the appointment was {CHL ONC TIME VISIT - WR:[id number] and more than 50% was on counseling.  This note was electronically signed.    Molli Hazard, MD MD 07/06/2014 2:21 PM

## 2014-07-07 ENCOUNTER — Encounter (HOSPITAL_COMMUNITY): Admission: RE | Payer: Self-pay | Source: Ambulatory Visit

## 2014-07-07 ENCOUNTER — Ambulatory Visit (HOSPITAL_COMMUNITY): Admission: RE | Admit: 2014-07-07 | Payer: Medicare Other | Source: Ambulatory Visit | Admitting: Gastroenterology

## 2014-07-07 SURGERY — COLONOSCOPY
Anesthesia: Moderate Sedation

## 2014-07-10 ENCOUNTER — Other Ambulatory Visit: Payer: Medicare Other | Admitting: Family

## 2014-07-13 ENCOUNTER — Ambulatory Visit (HOSPITAL_COMMUNITY): Payer: Medicare Other | Attending: Cardiovascular Disease

## 2014-07-20 ENCOUNTER — Other Ambulatory Visit: Payer: Medicare Other | Admitting: Family

## 2014-07-27 NOTE — Progress Notes (Signed)
This encounter was created in error - please disregard.

## 2014-08-03 ENCOUNTER — Telehealth: Payer: Self-pay | Admitting: Cardiology

## 2014-08-03 ENCOUNTER — Encounter: Payer: Medicare Other | Admitting: *Deleted

## 2014-08-03 NOTE — Telephone Encounter (Signed)
Spoke with pt and reminded pt of remote transmission that is due today. Pt verbalized understanding.   

## 2014-09-02 ENCOUNTER — Ambulatory Visit (INDEPENDENT_AMBULATORY_CARE_PROVIDER_SITE_OTHER): Payer: Medicare Other | Admitting: Family Medicine

## 2014-09-02 DIAGNOSIS — N183 Chronic kidney disease, stage 3 (moderate): Secondary | ICD-10-CM

## 2014-09-02 DIAGNOSIS — I4891 Unspecified atrial fibrillation: Secondary | ICD-10-CM

## 2014-09-02 DIAGNOSIS — I129 Hypertensive chronic kidney disease with stage 1 through stage 4 chronic kidney disease, or unspecified chronic kidney disease: Secondary | ICD-10-CM | POA: Diagnosis not present

## 2014-09-02 DIAGNOSIS — I5040 Unspecified combined systolic (congestive) and diastolic (congestive) heart failure: Secondary | ICD-10-CM

## 2014-09-29 ENCOUNTER — Telehealth: Payer: Self-pay | Admitting: Cardiovascular Disease

## 2014-09-29 ENCOUNTER — Ambulatory Visit (INDEPENDENT_AMBULATORY_CARE_PROVIDER_SITE_OTHER): Payer: Medicare Other | Admitting: *Deleted

## 2014-09-29 ENCOUNTER — Encounter: Payer: Self-pay | Admitting: Cardiovascular Disease

## 2014-09-29 DIAGNOSIS — I442 Atrioventricular block, complete: Secondary | ICD-10-CM

## 2014-09-29 DIAGNOSIS — I5043 Acute on chronic combined systolic (congestive) and diastolic (congestive) heart failure: Secondary | ICD-10-CM

## 2014-09-29 DIAGNOSIS — Z950 Presence of cardiac pacemaker: Secondary | ICD-10-CM | POA: Diagnosis not present

## 2014-09-29 NOTE — Progress Notes (Signed)
Remote pacemaker transmission.   

## 2014-09-29 NOTE — Telephone Encounter (Signed)
Kimberly [name] is calling from [facility] and she is needing confirmation of diagnosis of heart failfure , the date of las Echo, and the ejection fraction percent. Please call   Thanks

## 2014-09-29 NOTE — Telephone Encounter (Signed)
LMTCB

## 2014-10-03 ENCOUNTER — Encounter: Payer: Self-pay | Admitting: *Deleted

## 2014-10-03 NOTE — Addendum Note (Signed)
Addended by: Alvis Lemmings C on: 10/03/2014 12:37 PM   Modules accepted: Level of Service

## 2014-10-03 NOTE — Progress Notes (Addendum)
EPIC Encounter for ICM Monitoring  Patient Name: Kimberly [name] is a 81 y.o. female Date: 10/03/2014 Primary Care Physican: Wardell Honour, MD Primary Cardiologist: Croitoru Electrophysiologist: Croitoru Dry Weight: 130-135 lbs (typical per the patient)   Bi-V pacing: 99.5%      In the past month, have you:  1. Gained more than 2 pounds in a day or more than 5 pounds in a week? The patient states her weight will typically range from 130-135 lbs.   2. Had changes in your medications (with verification of current medications)? no  3. Had more shortness of breath than is usual for you? no  4. Limited your activity because of shortness of breath? no  5. Not been able to sleep because of shortness of breath? no  6. Had increased swelling in your feet or ankles? no  7. Had symptoms of dehydration (dizziness, dry mouth, increased thirst, decreased urine output) no  8. Had changes in sodium restriction? no  9. Been compliant with medication? Yes   ICM trend:   Follow-up plan: ICM clinic phone appointment in: 11/07/14. I have previously followed the patient in [facility] clinic. I have lost touch with her over the last several months due to issues with her transmitter. The patient's optivol readings have most recently been elevated for her from ~ 4/20- 5/24. She denies any swelling or changes with her breathing. She has not required extra lasix. She denies any change in the sodium content in her diet. I have made no changes today. I have advised her I will forward to Dr. Sallyanne Kuster to review and if he has any recommendations I will call her back and let her know.   Copy of note sent to patient's primary care physician, primary cardiologist, and device following physician.  Alvis Lemmings, RN, BSN 10/03/2014 12:30 PM  Thanks, no change to meds.     MCr    ----- Message -----     From: Emily Filbert, RN     Sent: 10/03/2014 12:37 PM      To: Sanda Klein, MD

## 2014-10-05 LAB — CUP PACEART REMOTE DEVICE CHECK
Battery Remaining Longevity: 56 mo
Battery Voltage: 3 V
Brady Statistic AP VP Percent: 0 %
Brady Statistic AS VP Percent: 97.78 %
Brady Statistic AS VS Percent: 2.22 %
Date Time Interrogation Session: 20160527180533
Lead Channel Impedance Value: 4047 Ohm
Lead Channel Impedance Value: 4047 Ohm
Lead Channel Impedance Value: 570 Ohm
Lead Channel Impedance Value: 703 Ohm
Lead Channel Impedance Value: 703 Ohm
Lead Channel Pacing Threshold Amplitude: 0.625 V
Lead Channel Pacing Threshold Pulse Width: 0.4 ms
Lead Channel Setting Pacing Amplitude: 1.75 V
Lead Channel Setting Pacing Pulse Width: 0.4 ms
MDC IDC MSMT LEADCHNL LV IMPEDANCE VALUE: 513 Ohm
MDC IDC MSMT LEADCHNL LV IMPEDANCE VALUE: 741 Ohm
MDC IDC MSMT LEADCHNL LV IMPEDANCE VALUE: 950 Ohm
MDC IDC MSMT LEADCHNL LV PACING THRESHOLD AMPLITUDE: 0.75 V
MDC IDC MSMT LEADCHNL LV PACING THRESHOLD PULSEWIDTH: 1 ms
MDC IDC MSMT LEADCHNL RV IMPEDANCE VALUE: 665 Ohm
MDC IDC MSMT LEADCHNL RV SENSING INTR AMPL: 9.5 mV
MDC IDC SET LEADCHNL LV PACING PULSEWIDTH: 1 ms
MDC IDC SET LEADCHNL RV PACING AMPLITUDE: 2 V
MDC IDC SET LEADCHNL RV SENSING SENSITIVITY: 2.8 mV
MDC IDC SET ZONE DETECTION INTERVAL: 400 ms
MDC IDC STAT BRADY AP VS PERCENT: 0 %
MDC IDC STAT BRADY RA PERCENT PACED: 0 %
MDC IDC STAT BRADY RV PERCENT PACED: 97.78 %
Zone Setting Detection Interval: 150 ms

## 2014-10-06 ENCOUNTER — Encounter: Payer: Self-pay | Admitting: Cardiology

## 2014-11-07 ENCOUNTER — Encounter: Payer: Medicare Other | Admitting: *Deleted

## 2014-11-07 ENCOUNTER — Telehealth: Payer: Self-pay | Admitting: Cardiology

## 2014-11-07 NOTE — Telephone Encounter (Signed)
Spoke with pt and reminded pt of remote transmission that is due today. Pt verbalized understanding.   

## 2014-11-15 DIAGNOSIS — H538 Other visual disturbances: Secondary | ICD-10-CM | POA: Diagnosis not present

## 2014-11-15 DIAGNOSIS — H26493 Other secondary cataract, bilateral: Secondary | ICD-10-CM | POA: Diagnosis not present

## 2014-11-15 DIAGNOSIS — Z961 Presence of intraocular lens: Secondary | ICD-10-CM | POA: Diagnosis not present

## 2014-11-20 ENCOUNTER — Encounter (HOSPITAL_COMMUNITY): Payer: Self-pay | Admitting: Ophthalmology

## 2014-11-20 ENCOUNTER — Ambulatory Visit (HOSPITAL_COMMUNITY)
Admission: RE | Admit: 2014-11-20 | Discharge: 2014-11-20 | Disposition: A | Discharge status: Home / Self Care | Payer: Medicare Other | Source: Ambulatory Visit | Attending: Ophthalmology | Admitting: Ophthalmology

## 2014-11-20 ENCOUNTER — Encounter (HOSPITAL_COMMUNITY)
Admission: RE | Disposition: A | Discharge status: Home / Self Care | Payer: Self-pay | Source: Ambulatory Visit | Attending: Ophthalmology

## 2014-11-20 DIAGNOSIS — H2640 Unspecified secondary cataract: Secondary | ICD-10-CM | POA: Diagnosis present

## 2014-11-20 DIAGNOSIS — H538 Other visual disturbances: Secondary | ICD-10-CM | POA: Insufficient documentation

## 2014-11-20 DIAGNOSIS — H26493 Other secondary cataract, bilateral: Secondary | ICD-10-CM | POA: Insufficient documentation

## 2014-11-20 DIAGNOSIS — M1990 Unspecified osteoarthritis, unspecified site: Secondary | ICD-10-CM | POA: Insufficient documentation

## 2014-11-20 DIAGNOSIS — Z882 Allergy status to sulfonamides status: Secondary | ICD-10-CM | POA: Diagnosis not present

## 2014-11-20 DIAGNOSIS — H3530 Unspecified macular degeneration: Secondary | ICD-10-CM | POA: Diagnosis not present

## 2014-11-20 DIAGNOSIS — I10 Essential (primary) hypertension: Secondary | ICD-10-CM | POA: Insufficient documentation

## 2014-11-20 DIAGNOSIS — H52209 Unspecified astigmatism, unspecified eye: Secondary | ICD-10-CM | POA: Insufficient documentation

## 2014-11-20 DIAGNOSIS — Z8659 Personal history of other mental and behavioral disorders: Secondary | ICD-10-CM | POA: Diagnosis not present

## 2014-11-20 DIAGNOSIS — Z880 Allergy status to penicillin: Secondary | ICD-10-CM | POA: Diagnosis not present

## 2014-11-20 DIAGNOSIS — H4720 Unspecified optic atrophy: Secondary | ICD-10-CM | POA: Insufficient documentation

## 2014-11-20 DIAGNOSIS — M797 Fibromyalgia: Secondary | ICD-10-CM | POA: Insufficient documentation

## 2014-11-20 DIAGNOSIS — Z961 Presence of intraocular lens: Secondary | ICD-10-CM | POA: Diagnosis not present

## 2014-11-20 DIAGNOSIS — H5210 Myopia, unspecified eye: Secondary | ICD-10-CM | POA: Diagnosis not present

## 2014-11-20 DIAGNOSIS — Z950 Presence of cardiac pacemaker: Secondary | ICD-10-CM | POA: Diagnosis not present

## 2014-11-20 DIAGNOSIS — H524 Presbyopia: Secondary | ICD-10-CM | POA: Insufficient documentation

## 2014-11-20 DIAGNOSIS — Z888 Allergy status to other drugs, medicaments and biological substances status: Secondary | ICD-10-CM | POA: Insufficient documentation

## 2014-11-20 DIAGNOSIS — Z7901 Long term (current) use of anticoagulants: Secondary | ICD-10-CM | POA: Diagnosis not present

## 2014-11-20 HISTORY — PX: YAG LASER APPLICATION: SHX6189

## 2014-11-20 SURGERY — TREATMENT, USING YAG LASER
Anesthesia: LOCAL | Laterality: Bilateral

## 2014-11-20 MED ORDER — TETRACAINE HCL 0.5 % OP SOLN
1.0000 [drp] | OPHTHALMIC | Status: AC
  Administered 2014-11-20 (×3): 1 [drp] via OPHTHALMIC

## 2014-11-20 MED ORDER — TROPICAMIDE 1 % OP SOLN
OPHTHALMIC | Status: AC
  Filled 2014-11-20: qty 3

## 2014-11-20 MED ORDER — TETRACAINE HCL 0.5 % OP SOLN
OPHTHALMIC | Status: AC
  Filled 2014-11-20: qty 2

## 2014-11-20 MED ORDER — TROPICAMIDE 1 % OP SOLN
1.0000 [drp] | OPHTHALMIC | Status: AC
  Administered 2014-11-20 (×3): 1 [drp] via OPHTHALMIC

## 2014-11-20 NOTE — Discharge Instructions (Signed)
Kimberly [name]  11/20/2014     Instructions    Activity: No Restrictions.   Diet: Resume Diet you were on at home.   Pain Medication: Tylenol if Needed.   CONTACT YOUR DOCTOR IF YOU HAVE PAIN, REDNESS IN YOUR EYE, OR DECREASED VISION.   Follow-up:in 2 weeks with Williams Che, MD.   Dr. Gershon Crane: [phone]  Dr. Iona HansenJI:[phone]  Dr. Geoffry ParadiseID:[id number]   If you find that you cannot contact your physician, but feel that your signs and   Symptoms warrant a physician's attention, call the Emergency Room at   [phone] ext.[phone].   Othern/a.

## 2014-11-20 NOTE — Brief Op Note (Signed)
Kimberly [name] 11/20/2014  Williams Che, MD  Pre-op Diagnosis:  secondary cataract bilateral  Post-op Diagnosis: same  Yag laser self-test completed: Yes.    Indications:  See scanned office H&P for specific indications  Procedure: YAG posterior capsulotomy  both eyes  Eye protection worn by staff:  Yes.   Laser In Use sign on door:  Yes.    Laser:  {LUMENIS YAG/SLT LASER selecta duet  Power Setting:  1.7 mJ/burst Anatomical site treated:  Posterior capsule OU Number of applications:  OD:  27   OS:  26 Total energy delivered: OD:  44.05   OS:  43.41 mJ Results:  Open visual axis OU  The patient was discharged home with instructions to continue all her current glaucoma medications in the un-operated eye, and discontinue all her current glaucoma medications, if any.  Patient was instructed to go to the office, as previously scheduled, for intraocular pressure:  No.  Patient verbalizes understanding of discharge instructions:  Yes.    Notes: pt tolerated procedures well.

## 2014-11-20 NOTE — Op Note (Signed)
None required

## 2014-11-20 NOTE — H&P (Signed)
Office H&P to be scanned

## 2014-11-28 ENCOUNTER — Emergency Department (HOSPITAL_COMMUNITY): Payer: Medicare Other

## 2014-11-28 ENCOUNTER — Encounter (HOSPITAL_COMMUNITY): Payer: Self-pay

## 2014-11-28 ENCOUNTER — Observation Stay (HOSPITAL_COMMUNITY)
Admission: EM | Admit: 2014-11-28 | Discharge: 2014-11-29 | Disposition: A | Discharge status: Home / Self Care | Payer: Medicare Other | Attending: Internal Medicine | Admitting: Internal Medicine

## 2014-11-28 ENCOUNTER — Inpatient Hospital Stay (HOSPITAL_BASED_OUTPATIENT_CLINIC_OR_DEPARTMENT_OTHER): Payer: Medicare Other

## 2014-11-28 DIAGNOSIS — Z883 Allergy status to other anti-infective agents status: Secondary | ICD-10-CM | POA: Insufficient documentation

## 2014-11-28 DIAGNOSIS — Z87891 Personal history of nicotine dependence: Secondary | ICD-10-CM | POA: Insufficient documentation

## 2014-11-28 DIAGNOSIS — I429 Cardiomyopathy, unspecified: Secondary | ICD-10-CM | POA: Insufficient documentation

## 2014-11-28 DIAGNOSIS — I5043 Acute on chronic combined systolic (congestive) and diastolic (congestive) heart failure: Secondary | ICD-10-CM | POA: Diagnosis not present

## 2014-11-28 DIAGNOSIS — M419 Scoliosis, unspecified: Secondary | ICD-10-CM | POA: Diagnosis not present

## 2014-11-28 DIAGNOSIS — R0789 Other chest pain: Secondary | ICD-10-CM | POA: Diagnosis not present

## 2014-11-28 DIAGNOSIS — E876 Hypokalemia: Secondary | ICD-10-CM | POA: Diagnosis present

## 2014-11-28 DIAGNOSIS — M546 Pain in thoracic spine: Secondary | ICD-10-CM | POA: Diagnosis not present

## 2014-11-28 DIAGNOSIS — R079 Chest pain, unspecified: Secondary | ICD-10-CM

## 2014-11-28 DIAGNOSIS — M549 Dorsalgia, unspecified: Secondary | ICD-10-CM | POA: Insufficient documentation

## 2014-11-28 DIAGNOSIS — I129 Hypertensive chronic kidney disease with stage 1 through stage 4 chronic kidney disease, or unspecified chronic kidney disease: Secondary | ICD-10-CM | POA: Insufficient documentation

## 2014-11-28 DIAGNOSIS — H409 Unspecified glaucoma: Secondary | ICD-10-CM | POA: Insufficient documentation

## 2014-11-28 DIAGNOSIS — R7989 Other specified abnormal findings of blood chemistry: Secondary | ICD-10-CM | POA: Diagnosis not present

## 2014-11-28 DIAGNOSIS — I4891 Unspecified atrial fibrillation: Secondary | ICD-10-CM | POA: Insufficient documentation

## 2014-11-28 DIAGNOSIS — N183 Chronic kidney disease, stage 3 unspecified: Secondary | ICD-10-CM | POA: Diagnosis present

## 2014-11-28 DIAGNOSIS — Z7901 Long term (current) use of anticoagulants: Secondary | ICD-10-CM | POA: Diagnosis not present

## 2014-11-28 DIAGNOSIS — I10 Essential (primary) hypertension: Secondary | ICD-10-CM | POA: Diagnosis present

## 2014-11-28 DIAGNOSIS — R778 Other specified abnormalities of plasma proteins: Secondary | ICD-10-CM | POA: Diagnosis present

## 2014-11-28 DIAGNOSIS — D631 Anemia in chronic kidney disease: Secondary | ICD-10-CM | POA: Insufficient documentation

## 2014-11-28 DIAGNOSIS — Z950 Presence of cardiac pacemaker: Secondary | ICD-10-CM | POA: Diagnosis not present

## 2014-11-28 DIAGNOSIS — M1990 Unspecified osteoarthritis, unspecified site: Secondary | ICD-10-CM | POA: Insufficient documentation

## 2014-11-28 DIAGNOSIS — Z882 Allergy status to sulfonamides status: Secondary | ICD-10-CM | POA: Diagnosis not present

## 2014-11-28 DIAGNOSIS — M797 Fibromyalgia: Secondary | ICD-10-CM | POA: Insufficient documentation

## 2014-11-28 DIAGNOSIS — I482 Chronic atrial fibrillation: Secondary | ICD-10-CM | POA: Diagnosis not present

## 2014-11-28 DIAGNOSIS — I428 Other cardiomyopathies: Secondary | ICD-10-CM

## 2014-11-28 DIAGNOSIS — Z880 Allergy status to penicillin: Secondary | ICD-10-CM | POA: Diagnosis not present

## 2014-11-28 DIAGNOSIS — R072 Precordial pain: Secondary | ICD-10-CM | POA: Diagnosis not present

## 2014-11-28 DIAGNOSIS — D649 Anemia, unspecified: Secondary | ICD-10-CM | POA: Diagnosis present

## 2014-11-28 DIAGNOSIS — I4821 Permanent atrial fibrillation: Secondary | ICD-10-CM | POA: Diagnosis present

## 2014-11-28 DIAGNOSIS — F329 Major depressive disorder, single episode, unspecified: Secondary | ICD-10-CM | POA: Insufficient documentation

## 2014-11-28 DIAGNOSIS — I5032 Chronic diastolic (congestive) heart failure: Secondary | ICD-10-CM | POA: Diagnosis present

## 2014-11-28 DIAGNOSIS — Z79899 Other long term (current) drug therapy: Secondary | ICD-10-CM | POA: Insufficient documentation

## 2014-11-28 DIAGNOSIS — R0602 Shortness of breath: Secondary | ICD-10-CM | POA: Diagnosis not present

## 2014-11-28 LAB — SAMPLE TO BLOOD BANK

## 2014-11-28 LAB — TROPONIN I
TROPONIN I: 0.09 ng/mL — AB (ref <0.031)
Troponin I: 0.11 ng/mL — ABNORMAL HIGH (ref <0.031)
Troponin I: 0.09 ng/mL — ABNORMAL HIGH (ref <0.031)
Troponin I: 0.08 ng/mL — ABNORMAL HIGH (ref <0.031)

## 2014-11-28 LAB — MRSA PCR SCREENING: MRSA by PCR: NEGATIVE

## 2014-11-28 LAB — BASIC METABOLIC PANEL
ANION GAP: 11 (ref 5–15)
BUN: 22 mg/dL — AB (ref 6–20)
CALCIUM: 8.2 mg/dL — AB (ref 8.9–10.3)
CO2: 23 mmol/L (ref 22–32)
Chloride: 99 mmol/L — ABNORMAL LOW (ref 101–111)
Creatinine, Ser: 1.72 mg/dL — ABNORMAL HIGH (ref 0.44–1.00)
GFR calc Af Amer: 30 mL/min — ABNORMAL LOW (ref >60)
GFR calc non Af Amer: 26 mL/min — ABNORMAL LOW (ref >60)
Glucose, Bld: 108 mg/dL — ABNORMAL HIGH (ref 65–99)
POTASSIUM: 2.9 mmol/L — AB (ref 3.5–5.1)
Sodium: 133 mmol/L — ABNORMAL LOW (ref 135–145)

## 2014-11-28 LAB — CBC WITH DIFFERENTIAL/PLATELET
Basophils Absolute: 0 10*3/uL (ref 0.0–0.1)
Basophils Relative: 0 % (ref 0–1)
EOS PCT: 1 % (ref 0–5)
Eosinophils Absolute: 0.1 10*3/uL (ref 0.0–0.7)
HCT: 25.3 % — ABNORMAL LOW (ref 36.0–46.0)
Hemoglobin: 8.5 g/dL — ABNORMAL LOW (ref 12.0–15.0)
Lymphocytes Relative: 8 % — ABNORMAL LOW (ref 12–46)
Lymphs Abs: 0.8 10*3/uL (ref 0.7–4.0)
MCH: 28.5 pg (ref 26.0–34.0)
MCHC: 33.6 g/dL (ref 30.0–36.0)
MCV: 84.9 fL (ref 78.0–100.0)
Monocytes Absolute: 0.9 10*3/uL (ref 0.1–1.0)
Monocytes Relative: 8 % (ref 3–12)
NEUTROS PCT: 83 % — AB (ref 43–77)
Neutro Abs: 9.4 10*3/uL — ABNORMAL HIGH (ref 1.7–7.7)
Platelets: 242 10*3/uL (ref 150–400)
RBC: 2.98 MIL/uL — ABNORMAL LOW (ref 3.87–5.11)
RDW: 16.3 % — AB (ref 11.5–15.5)
WBC: 11.2 10*3/uL — AB (ref 4.0–10.5)

## 2014-11-28 LAB — HEPATIC FUNCTION PANEL
ALBUMIN: 2.8 g/dL — AB (ref 3.5–5.0)
ALT: 11 U/L — AB (ref 14–54)
AST: 21 U/L (ref 15–41)
Alkaline Phosphatase: 49 U/L (ref 38–126)
BILIRUBIN INDIRECT: 0.7 mg/dL (ref 0.3–0.9)
Bilirubin, Direct: 0.2 mg/dL (ref 0.1–0.5)
Total Bilirubin: 0.9 mg/dL (ref 0.3–1.2)
Total Protein: 5.7 g/dL — ABNORMAL LOW (ref 6.5–8.1)

## 2014-11-28 LAB — VITAMIN B12: VITAMIN B 12: 357 pg/mL (ref 180–914)

## 2014-11-28 LAB — IRON AND TIBC
Iron: 14 ug/dL — ABNORMAL LOW (ref 28–170)
SATURATION RATIOS: 3 % — AB (ref 10.4–31.8)
TIBC: 413 ug/dL (ref 250–450)
UIBC: 399 ug/dL

## 2014-11-28 LAB — RETICULOCYTES
RBC.: 2.97 MIL/uL — ABNORMAL LOW (ref 3.87–5.11)
RETIC COUNT ABSOLUTE: 56.4 10*3/uL (ref 19.0–186.0)
Retic Ct Pct: 1.9 % (ref 0.4–3.1)

## 2014-11-28 LAB — FERRITIN: FERRITIN: 31 ng/mL (ref 11–307)

## 2014-11-28 LAB — MAGNESIUM: Magnesium: 1.9 mg/dL (ref 1.7–2.4)

## 2014-11-28 LAB — LIPASE, BLOOD: Lipase: 20 U/L — ABNORMAL LOW (ref 22–51)

## 2014-11-28 LAB — TSH: TSH: 3.392 u[IU]/mL (ref 0.350–4.500)

## 2014-11-28 LAB — FOLATE: Folate: 15.3 ng/mL (ref >5.9)

## 2014-11-28 MED ORDER — MORPHINE SULFATE 2 MG/ML IJ SOLN
2.0000 mg | Freq: Once | INTRAMUSCULAR | Status: AC
  Administered 2014-11-28: 2 mg via INTRAVENOUS
  Filled 2014-11-28: qty 1

## 2014-11-28 MED ORDER — POTASSIUM CHLORIDE IN NACL 20-0.9 MEQ/L-% IV SOLN
INTRAVENOUS | Status: DC
  Administered 2014-11-28: 17:00:00 via INTRAVENOUS

## 2014-11-28 MED ORDER — MORPHINE SULFATE 2 MG/ML IJ SOLN
1.0000 mg | INTRAMUSCULAR | Status: DC | PRN
  Administered 2014-11-29 (×3): 1 mg via INTRAVENOUS
  Filled 2014-11-28 (×3): qty 1

## 2014-11-28 MED ORDER — ONDANSETRON HCL 4 MG/2ML IJ SOLN: 4.0000 mg | Freq: Four times a day (QID) | INTRAMUSCULAR | Status: DC | PRN

## 2014-11-28 MED ORDER — NITROGLYCERIN 2 % TD OINT
1.0000 [in_us] | TOPICAL_OINTMENT | Freq: Once | TRANSDERMAL | Status: AC
  Administered 2014-11-28: 1 [in_us] via TOPICAL
  Filled 2014-11-28: qty 1

## 2014-11-28 MED ORDER — HYDROCODONE-ACETAMINOPHEN 5-325 MG PO TABS
1.0000 | ORAL_TABLET | ORAL | Status: DC | PRN
  Administered 2014-11-28: 1 via ORAL
  Administered 2014-11-28: 2 via ORAL
  Filled 2014-11-28: qty 1
  Filled 2014-11-28: qty 2

## 2014-11-28 MED ORDER — MAGNESIUM CITRATE PO SOLN: 1.0000 | Freq: Once | ORAL | Status: AC | PRN

## 2014-11-28 MED ORDER — POTASSIUM CHLORIDE CRYS ER 20 MEQ PO TBCR
40.0000 meq | EXTENDED_RELEASE_TABLET | Freq: Once | ORAL | Status: AC
  Administered 2014-11-28: 40 meq via ORAL
  Filled 2014-11-28: qty 2

## 2014-11-28 MED ORDER — SODIUM CHLORIDE 0.9 % IJ SOLN: 3.0000 mL | INTRAMUSCULAR | Status: DC | PRN

## 2014-11-28 MED ORDER — SODIUM CHLORIDE 0.9 % IJ SOLN
3.0000 mL | Freq: Two times a day (BID) | INTRAMUSCULAR | Status: DC
  Administered 2014-11-28 (×2): 3 mL via INTRAVENOUS
  Administered 2014-11-29: 10 mL via INTRAVENOUS

## 2014-11-28 MED ORDER — ACETAMINOPHEN 325 MG PO TABS: 650.0000 mg | ORAL_TABLET | Freq: Four times a day (QID) | ORAL | Status: DC | PRN

## 2014-11-28 MED ORDER — FUROSEMIDE 40 MG PO TABS: 60.0000 mg | ORAL_TABLET | Freq: Every day | ORAL | Status: DC

## 2014-11-28 MED ORDER — ONDANSETRON HCL 4 MG PO TABS: 4.0000 mg | ORAL_TABLET | Freq: Four times a day (QID) | ORAL | Status: DC | PRN

## 2014-11-28 MED ORDER — POTASSIUM CHLORIDE CRYS ER 20 MEQ PO TBCR
20.0000 meq | EXTENDED_RELEASE_TABLET | Freq: Every day | ORAL | Status: DC
  Administered 2014-11-29: 20 meq via ORAL
  Filled 2014-11-28: qty 1

## 2014-11-28 MED ORDER — ACETAMINOPHEN 650 MG RE SUPP: 650.0000 mg | Freq: Four times a day (QID) | RECTAL | Status: DC | PRN

## 2014-11-28 MED ORDER — TRAZODONE HCL 50 MG PO TABS
25.0000 mg | ORAL_TABLET | Freq: Every evening | ORAL | Status: DC | PRN
  Administered 2014-11-28: 25 mg via ORAL
  Filled 2014-11-28: qty 1

## 2014-11-28 MED ORDER — ALUM & MAG HYDROXIDE-SIMETH 200-200-20 MG/5ML PO SUSP: 30.0000 mL | Freq: Four times a day (QID) | ORAL | Status: DC | PRN

## 2014-11-28 MED ORDER — SENNOSIDES-DOCUSATE SODIUM 8.6-50 MG PO TABS
1.0000 | ORAL_TABLET | Freq: Every evening | ORAL | Status: DC | PRN
Start: 2014-11-28 — End: 2014-11-29

## 2014-11-28 MED ORDER — PANTOPRAZOLE SODIUM 40 MG IV SOLR
40.0000 mg | Freq: Once | INTRAVENOUS | Status: AC
  Administered 2014-11-28: 40 mg via INTRAVENOUS
  Filled 2014-11-28: qty 40

## 2014-11-28 MED ORDER — SODIUM CHLORIDE 0.9 % IV SOLN: 250.0000 mL | INTRAVENOUS | Status: DC | PRN

## 2014-11-28 MED ORDER — APIXABAN 2.5 MG PO TABS
2.5000 mg | ORAL_TABLET | Freq: Two times a day (BID) | ORAL | Status: DC
  Administered 2014-11-28 – 2014-11-29 (×2): 2.5 mg via ORAL
  Filled 2014-11-28 (×6): qty 1

## 2014-11-28 MED ORDER — POTASSIUM CHLORIDE IN NACL 40-0.9 MEQ/L-% IV SOLN: INTRAVENOUS | Status: DC

## 2014-11-28 MED ORDER — BISACODYL 10 MG RE SUPP: 10.0000 mg | Freq: Every day | RECTAL | Status: DC | PRN

## 2014-11-28 NOTE — ED Provider Notes (Signed)
CSN: [id number]     Arrival date & time 11/28/14  [id number] History   First MD Initiated Contact with Patient 11/28/14 [phone]     Chief Complaint  Patient presents with  . Chest Pain  . Back Pain     (Consider location/radiation/quality/duration/timing/severity/associated sxs/prior Treatment) Patient is a 81 y.o. female presenting with chest pain and back pain. The history is provided by the patient (the pt has had chest pain for 2 weeks and worse last night).  Chest Pain Pain location:  Substernal area Pain quality: aching   Pain radiates to:  Upper back Pain radiates to the back: yes   Pain severity:  Moderate Onset quality:  Sudden Timing:  Constant Associated symptoms: back pain   Associated symptoms: no abdominal pain, no cough, no fatigue and no headache   Back Pain Associated symptoms: chest pain   Associated symptoms: no abdominal pain and no headaches     Past Medical History  Diagnosis Date  . Depression   . Atrial fibrillation   . Scoliosis   . Osteoarthritis   . Pacemaker     Last saw cards 07/2013  . Fibromyalgia   . Hypertension   . Glaucoma   . Acute diverticulitis 08/24/2013  . CKD (chronic kidney disease) stage 3, GFR 30-59 ml/min 10/12/2012  . Chronic anticoagulation 10/12/2012  . Acute blood loss anemia 10/11/2012  . Antral ulcer 10/11/2012  . Erosive esophagitis 10/11/2012  . Cardiomyopathy, nonischemic   . Acute on chronic combined systolic and diastolic CHF, NYHA class 4 11/15/2013  . Arrhythmia     atrial fibb  . H/O echocardiogram 2007    EF 40-45%,          Past Surgical History  Procedure Laterality Date  . Pacemaker insertion    . Abdominal hysterectomy    . Cholecystectomy    . Appendectomy    . Breast surgery    . Back surgery    . Neck surgery    . Hernia repair      right inguinal hernia and umbilical  . Tonsillectomy    . Esophagogastroduodenoscopy N/A 10/13/2012    Dr. Gala Romney: severe ulcerative reflux esophagitis, question of Barrett's but  negative path, single deep prepyloric antral ulcer, negative H.pylori  . Cystoscopy N/A 02/24/2013    Procedure: CYSTOSCOPY WITH URETHRAL DILITATION;  Surgeon: Marissa Nestle, MD;  Location: [facility];  Service: Urology;  Laterality: N/A;  . Doppler echocardiography N/A 05/30/2010    LV SIZE IS NORMAL. LV SYSTOLIC FUNCTION IS LOW NORMAL. EF=50-55%. MILD INFERIOR HYPOKINESIS.MILD TO MODERATE POSTERIOR WALL HYPOKINESIS.PACEMAKER LEAD IN THE RV. LA IS MILDLY DILATED. RA IS MODERATE TO SEVERLY DILATED. PACEMAKER LEAD IN THE RA. MILD CALCICICATION OF THE MV APPARATUS. MODERATE MR. MILD TO MODERATE TR. MILD PHTN.AV MILDLY SCLEROTIC.  . Nuclear stress test N/A 02/13/2009    NORMAL PATTERN OF PERFUSION IN ALL REGIONS. POST STRESS VENTICULAR SIZE IS NORMAL. POST  STESS EF 85%.  NORMAL MYOCARDIAL PERFUSION STUDY.  . Cardiac catheterization  12/08/2005    LAD AND LEFT MAIN WITH NO HIGH-GRADE STENOSIS. MILD DISEASE IN THE CX AND LAD SYSTEM. SEVERE LV DYSFUNCTION WITH DILATION OF THE LV. EF 15-20%. LV END-DIASTOLIC PRESSURE IS 90. +1 MR.  Fawn Kirk ext venous Bilateral 11-08-10    R & L- NO EVIDENCE OF THROMBUS OR THROMBOPHLEBITIS. THERE IS MILD AMOUNT OF SUBCUTANEOUS EDEMA NOTED WITHIN THE LEFT CALF AND ANKLE. R & L GSV AND SSV- NO VENOUS INSUFF NOTED.  . Yag  laser application Bilateral [id number]    Procedure: YAG LASER APPLICATION;  Surgeon: Williams Che, MD;  Location: [facility];  Service: Ophthalmology;  Laterality: Bilateral;   Family History  Problem Relation Age of Onset  . Cancer Sister   . Asthma Sister   . Heart failure Brother   . Colon cancer Neg Hx    History  Substance Use Topics  . Smoking status: Former Smoker -- 0.50 packs/day for 30 years    Types: Cigarettes    Quit date: 12/13/2004  . Smokeless tobacco: Not on file  . Alcohol Use: 0.0 oz/week    0 Glasses of wine per week     Comment: history of drinking a 1/2 glass of wine in the evening   OB History    Gravida Para Term Preterm AB  TAB SAB Ectopic Multiple Living   5 2 1 1 3  3   2      Review of Systems  Constitutional: Negative for appetite change and fatigue.  HENT: Negative for congestion, ear discharge and sinus pressure.   Eyes: Negative for discharge.  Respiratory: Negative for cough.   Cardiovascular: Positive for chest pain.  Gastrointestinal: Negative for abdominal pain and diarrhea.  Genitourinary: Negative for frequency and hematuria.  Musculoskeletal: Positive for back pain.  Skin: Negative for rash.  Neurological: Negative for seizures and headaches.  Psychiatric/Behavioral: Negative for hallucinations.      Allergies  Iron; Iodine; Penicillins; and Sulfa antibiotics  Home Medications   Prior to Admission medications   Medication Sig Start Date End Date Taking? Authorizing Provider  apixaban (ELIQUIS) 2.5 MG TABS tablet Take 1 tablet by mouth  twice a day Patient taking differently: Take 2.5 mg by mouth 2 (two) times daily.  05/19/14  Yes Mihai Croitoru, MD  carvedilol (COREG) 6.25 MG tablet Take 1 tablet (6.25 mg total) by mouth 2 (two) times daily with a meal. 12/01/13  Yes Mihai Croitoru, MD  furosemide (LASIX) 20 MG tablet Take 60 mg by mouth daily.   Yes Historical Provider, MD  potassium chloride SA (K-DUR,KLOR-CON) 20 MEQ tablet Take 1 tablet (20 mEq total) by mouth 3 (three) times daily. Patient taking differently: Take 20 mEq by mouth daily.  06/07/14  Yes Claretta Fraise, MD  HYDROcodone-acetaminophen (NORCO/VICODIN) 5-325 MG per tablet Take 1-2 tablets by mouth every 6 (six) hours as needed for moderate pain. Patient not taking: Reported on 11/28/2014 08/06/14 09/05/14  Claretta Fraise, MD  polyethylene glycol-electrolytes (NULYTELY/GOLYTELY) 420 G solution Take 4,000 mLs by mouth once. Patient not taking: Reported on 11/28/2014 06/14/14   Orvil Feil, NP  traZODone (DESYREL) 50 MG tablet Take 0.5-1 tablets (25-50 mg total) by mouth at bedtime as needed for sleep. Patient not taking: Reported on  11/16/2014 06/07/14   Claretta Fraise, MD   BP 107/57 mmHg  Pulse 70  Temp(Src) 98.4 F (36.9 C) (Oral)  Resp 19  Ht 5\' 5"  (1.651 m)  Wt 140 lb (63.504 kg)  BMI 23.30 kg/m2  SpO2 99% Physical Exam  Constitutional: She is oriented to person, place, and time. She appears well-developed.  HENT:  Head: Normocephalic.  Eyes: Conjunctivae and EOM are normal. No scleral icterus.  Neck: Neck supple. No thyromegaly present.  Cardiovascular: Normal rate and regular rhythm.  Exam reveals no gallop and no friction rub.   No murmur heard. Pulmonary/Chest: No stridor. She has no wheezes. She has no rales. She exhibits no tenderness.  Abdominal: She exhibits no distension. There is no tenderness.  There is no rebound.  Musculoskeletal: Normal range of motion. She exhibits no edema.  Lymphadenopathy:    She has no cervical adenopathy.  Neurological: She is oriented to person, place, and time. She exhibits normal muscle tone. Coordination normal.  Skin: No rash noted. No erythema.  Psychiatric: She has a normal mood and affect. Her behavior is normal.    ED Course  Procedures (including critical care time) Labs Review Labs Reviewed  CBC WITH DIFFERENTIAL/PLATELET - Abnormal; Notable for the following:    WBC 11.2 (*)    RBC 2.98 (*)    Hemoglobin 8.5 (*)    HCT 25.3 (*)    RDW 16.3 (*)    Neutrophils Relative % 83 (*)    Neutro Abs 9.4 (*)    Lymphocytes Relative 8 (*)    All other components within normal limits  BASIC METABOLIC PANEL - Abnormal; Notable for the following:    Sodium 133 (*)    Potassium 2.9 (*)    Chloride 99 (*)    Glucose, Bld 108 (*)    BUN 22 (*)    Creatinine, Ser 1.72 (*)    Calcium 8.2 (*)    GFR calc non Af Amer 26 (*)    GFR calc Af Amer 30 (*)    All other components within normal limits  TROPONIN I - Abnormal; Notable for the following:    Troponin I 0.11 (*)    All other components within normal limits  HEPATIC FUNCTION PANEL - Abnormal; Notable for the  following:    Total Protein 5.7 (*)    Albumin 2.8 (*)    ALT 11 (*)    All other components within normal limits  LIPASE, BLOOD - Abnormal; Notable for the following:    Lipase 20 (*)    All other components within normal limits  TROPONIN I  TROPONIN I  TROPONIN I  MAGNESIUM    Imaging Review Dg Chest Portable 1 View  11/28/2014   CLINICAL DATA:  Shortness of breath, chest pressure since last night.  EXAM: PORTABLE CHEST - 1 VIEW  COMPARISON:  Sep 16, 2013  FINDINGS: The heart size and mediastinal contours are stable. Cardiac pacemaker is unchanged. Heart size is enlarged. There is no focal infiltrate, pulmonary edema, or pleural effusion. The visualized skeletal structures are stable.  IMPRESSION: No active cardiopulmonary disease.   Electronically Signed   By: Abelardo Diesel M.D.   On: 11/28/2014 09:59     EKG Interpretation   Date/Time:  Tuesday November 28 2014 09:44:00 EDT Ventricular Rate:  73 PR Interval:  491 QRS Duration: 159 QT Interval:  471 QTC Calculation: 519 R Axis:   0 Text Interpretation:  Sinus or ectopic atrial rhythm Prolonged PR interval  Right bundle branch block Probable left ventricular hypertrophy Prolonged  QT interval Confirmed by Trishelle Devora  MD, Gustavus Haskin [phone]) on 11/28/2014 9:59:30  AM     CRITICAL CARE Performed by: Zarriah Starkel L Total critical care time: 40 Critical care time was exclusive of separately billable procedures and treating other patients. Critical care was necessary to treat or prevent imminent or life-threatening deterioration. Critical care was time spent personally by me on the following activities: development of treatment plan with patient and/or surrogate as well as nursing, discussions with consultants, evaluation of patient's response to treatment, examination of patient, obtaining history from patient or surrogate, ordering and performing treatments and interventions, ordering and review of laboratory studies, ordering and review of  radiographic studies, pulse oximetry  and re-evaluation of patient's condition.  MDM   Final diagnoses:  Chest pain at rest    Chest pain elevated troponin.  Seen by cards.  Will admit to tele and cycle enzymes    Milton Ferguson, MD 11/28/14 [phone]

## 2014-11-28 NOTE — ED Notes (Signed)
Pt requesting no IV access at this time.  States that she is a hard stick and does not want more than one attempt.

## 2014-11-28 NOTE — ED Notes (Addendum)
Pt states she is pain free at present but states she is hungry. Pt made aware we would get her something to eat as soon as she was allowed to eat

## 2014-11-28 NOTE — ED Notes (Signed)
Dyanne Carrel, NP in room with pt

## 2014-11-28 NOTE — Consult Note (Signed)
CARDIOLOGY CONSULT NOTE   Patient ID: Kimberly [name] MRN: [id number] DOB/AGE: March 06, 1927 81 y.o.  Admit Date: 11/28/2014 Referring Physician: ED -Zammitt Primary Physician: Wardell Honour, MD Consulting Cardiologist: Carlyle Dolly MD Primary Cardiologist Croitoru Reason for Consultation: Chest Pain   Clinical Summary Kimberly [name] is a 81 y.o.female known history of permanent atrial fibrillation, status post AV node ablation,on Epixaban 2.5 mg BID with CHADS VASC Score of 4 , complete heart block status post pacemaker implantation, recently "downgraded" to CRT-P since her left ventricular ejection fraction was found to be 50-55%, [employer]  chronic diastolic heart failure, chronic anemia, chronic kidney disease, who presented to the emergency room after 3 days of unrelenting chest pressure. The patient began to have some chest pressure about a week ago which was coming and going, but over the last 3 days , constant, and worsening "as someone sitting on my chest" with dyspnea. She also has chronic back pain. It was noted, that she was hospitalized at [facility] in December 2015. 4. Anemia, receiving 2 units of red blood cells. There is no clear evidence of GI bleed.   On arrival to the emergency room blood pressure was 123/51, heart rate 69, respirations 20, O2 sat 97%. On arrival the patient was found to be anemic with a hemoglobin of 8.5 and hematocrit of 25.3 with elevated white blood cells of 11.2. She was found to be mildly hyponatremic with a sodium of 133, potassium 2.9, chloride 99, glucose 108, creatinine 1.72. Troponin 0.11. Chest x-ray was negative for CHF, pneumonia or COPD. EKG revealed ventricular paced rhythm, rate of 71 bpm.   The patient admits to not taking potassium as directed. She states that the pills too big for her to swallow. She just did not like taking it. She was treated with oral potassium 40 mEq 1, provided with morphine, nitroglycerin 1 inch paste to chest  wall and protonix. On last office visit with Dr.Croitoru, she was scheduled for an echocardiogram. He was also documented in his office note on 06/30/2014 that she does have a history of medication nonadherence. He also stated that the patient had wide oscillations between edema and dyspnea and dehydration with renal insufficiency.   She currently continues to complain of chest pressure, chronic back pain and mild dyspnea although her oxygen saturation is 99% on room air.  Allergies  Allergen Reactions  . Iron Swelling    Ferrous Sulfate - tongue swelling   . Iodine Rash and Other (See Comments)    REACTION:If injected,  Rash/irritated skin reaction "welts"  . Penicillins Hives  . Sulfa Antibiotics Rash    Medications Scheduled Medications:    Infusions:    PRN Medications:     Past Medical History  Diagnosis Date  . Depression   . Atrial fibrillation   . Scoliosis   . Osteoarthritis   . Pacemaker     Last saw cards 07/2013  . Fibromyalgia   . Hypertension   . Glaucoma   . Acute diverticulitis 08/24/2013  . CKD (chronic kidney disease) stage 3, GFR 30-59 ml/min 10/12/2012  . Chronic anticoagulation 10/12/2012  . Acute blood loss anemia 10/11/2012  . Antral ulcer 10/11/2012  . Erosive esophagitis 10/11/2012  . Cardiomyopathy, nonischemic   . Acute on chronic combined systolic and diastolic CHF, NYHA class 4 11/15/2013  . Arrhythmia     atrial fibb  . H/O echocardiogram 2007    EF 40-45%,  Past Surgical History  Procedure Laterality Date  . Pacemaker insertion    . Abdominal hysterectomy    . Cholecystectomy    . Appendectomy    . Breast surgery    . Back surgery    . Neck surgery    . Hernia repair      right inguinal hernia and umbilical  . Tonsillectomy    . Esophagogastroduodenoscopy N/A 10/13/2012    Dr. Gala Romney: severe ulcerative reflux esophagitis, question of Barrett's but negative path, single deep prepyloric antral ulcer, negative H.pylori  .  Cystoscopy N/A 02/24/2013    Procedure: CYSTOSCOPY WITH URETHRAL DILITATION;  Surgeon: Marissa Nestle, MD;  Location: [facility];  Service: Urology;  Laterality: N/A;  . Doppler echocardiography N/A 05/30/2010    LV SIZE IS NORMAL. LV SYSTOLIC FUNCTION IS LOW NORMAL. EF=50-55%. MILD INFERIOR HYPOKINESIS.MILD TO MODERATE POSTERIOR WALL HYPOKINESIS.PACEMAKER LEAD IN THE RV. LA IS MILDLY DILATED. RA IS MODERATE TO SEVERLY DILATED. PACEMAKER LEAD IN THE RA. MILD CALCICICATION OF THE MV APPARATUS. MODERATE MR. MILD TO MODERATE TR. MILD PHTN.AV MILDLY SCLEROTIC.  . Nuclear stress test N/A 02/13/2009    NORMAL PATTERN OF PERFUSION IN ALL REGIONS. POST STRESS VENTICULAR SIZE IS NORMAL. POST  STESS EF 85%.  NORMAL MYOCARDIAL PERFUSION STUDY.  . Cardiac catheterization  12/08/2005    LAD AND LEFT MAIN WITH NO HIGH-GRADE STENOSIS. MILD DISEASE IN THE CX AND LAD SYSTEM. SEVERE LV DYSFUNCTION WITH DILATION OF THE LV. EF 15-20%. LV END-DIASTOLIC PRESSURE IS 90. +1 MR.  Fawn Kirk ext venous Bilateral 11-08-10    R & L- NO EVIDENCE OF THROMBUS OR THROMBOPHLEBITIS. THERE IS MILD AMOUNT OF SUBCUTANEOUS EDEMA NOTED WITHIN THE LEFT CALF AND ANKLE. R & L GSV AND SSV- NO VENOUS INSUFF NOTED.  . Yag laser application Bilateral [id number]    Procedure: YAG LASER APPLICATION;  Surgeon: Williams Che, MD;  Location: [facility];  Service: Ophthalmology;  Laterality: Bilateral;    Family History  Problem Relation Age of Onset  . Cancer Sister   . Asthma Sister   . Heart failure Brother   . Colon cancer Neg Hx     Social History Kimberly [name] reports that she quit smoking about 9 years ago. Her smoking use included Cigarettes. She has a 15 pack-year smoking history. She does not have any smokeless tobacco history on file. Kimberly [name] reports that she drinks alcohol.  Review of Systems Complete review of systems are found to be negative unless outlined in H&P above.  Physical Examination Blood pressure 117/57, pulse 70, temperature  98.4 F (36.9 C), temperature source Oral, resp. rate 21, height 5\' 5"  (1.651 m), weight 140 lb (63.504 kg), SpO2 98 %. No intake or output data in the 24 hours ending 11/28/14 1132  Telemetry: Ventricularly paced rhythm   GEN: Anxious, continuing to complain of chest pressure. Pale  HEENT: Conjunctiva and lids normal, oropharynx clear with moist mucosa. Neck: Supple, no elevated JVP or carotid bruits, no thyromegaly. Lungs: Crackles in the left base, without wheezes or rhonchi  Cardiac: Regular rate and rhythm, no S3 or significant systolic murmur, no pericardial rub. Abdomen: Soft, nontender, no hepatomegaly, bowel sounds present, no guarding or rebound. Extremities: No pitting edema, distal pulses 2+. Skin: Warm and dry. Musculoskeletal: No kyphosis.Complaining of lower back pain with muscles thousand's and her left leg.  Neuropsychiatric: Alert and oriented x3, affect grossly appropriate.  Prior Cardiac Testing/Procedures  1.Device remote check 09/29/2014  Remote CRT-P device check. Normal device  function. Thresholds, sensing, impedance consistent with previous measurements. Histograms appropriatefor patient and level of activity. No mode switches or ventricular high rate episodes. Patient bi-ventricularly pacing >99% of the time. Deviceprogrammed with appropriate safety margins. Device heart failure diagnostics are within normal limits currently, unstable over time, followed in [facility] clinic. Estimated longevity 4.5 years. ROV with [facility] in August.  2. Cardiac Cath 09/17/2010 CORONARY ARTERIOGRAPHY: There was dense calcification in the proximal portion of the left main, the proximal third of the LAD, and also at the bifurcation of the LAD and the diagonal. 1. Left main normal. It bifurcated. 2. LAD. The LAD crossed the apex of the heart. In the mid portion of the  LAD at the bifurcation of the diagonal was an area of 30% narrowing in  the LAD itself. There were minimal  irregularities in the proximal  portion where the more dense calcification was seen. The diagonal  itself was a moderate-size vessel without significant irregularities. 3. Circumflex. The circumflex gave rise to a large OM vessel and  bifurcated. At the bifurcation of the circumflex was an area of 30-40%  narrowing. The ongoing circumflex right at the bifurcation had an area  of 40% narrowing. 4. Right coronary artery. The right coronary had no significant disease.  The PDA and small posterolateral Tamikia Chowning were seen, and they were free of  disease also.  CONCLUSION: 1. Dense calcification of the left anterior descending and left main with  no high-grade stenosis. Mild disease in the circumflex and left  anterior descending system. 2. Severe left ventricular dysfunction with dilatation of the left  ventricle. 3. Ejection fraction approximately 15 to 20%. 4. Left ventricular end-diastolic pressure of 90. 5. +1 mitral regurgitation.  Echocardiogram 05/30/2010 Left ventricular systolic function low normal, EF of 50-55%,  There is mild inferior wall hypokinesis, there is mild to moderate posterior wall hypokinesis, there is a pacemaker lead in the right ventricle, the LAD is mildly dilated. The right atrium is moderate to severely dilated. There is a catheter pacemaker lead seen in the right atrium. There is mild calcification of the mitral valve apparatus. There is moderate mitral regurg. There is mild to moderate tricuspid regurg. There is mild pulmonary hypertension. He aortic valve appears to be mildly sclerotic.  Lab Results  Basic Metabolic Panel:  Recent Labs Lab 11/28/14 1018  NA 133*  K 2.9*  CL 99*  CO2 23  GLUCOSE 108*  BUN 22*  CREATININE 1.72*  CALCIUM 8.2*    Liver Function Tests:  Recent Labs Lab 11/28/14 1018  AST 21  ALT 11*  ALKPHOS 49  BILITOT 0.9  PROT 5.7*  ALBUMIN 2.8*    CBC:  Recent  Labs Lab 11/28/14 1018  WBC 11.2*  NEUTROABS 9.4*  HGB 8.5*  HCT 25.3*  MCV 84.9  PLT 242    Cardiac Enzymes:  Recent Labs Lab 11/28/14 1018  TROPONINI 0.11*    Radiology: Dg Chest Portable 1 View  11/28/2014   CLINICAL DATA:  Shortness of breath, chest pressure since last night.  EXAM: PORTABLE CHEST - 1 VIEW  COMPARISON:  Sep 16, 2013  FINDINGS: The heart size and mediastinal contours are stable. Cardiac pacemaker is unchanged. Heart size is enlarged. There is no focal infiltrate, pulmonary edema, or pleural effusion. The visualized skeletal structures are stable.  IMPRESSION: No active cardiopulmonary disease.   Electronically Signed   By: Abelardo Diesel M.D.   On: 11/28/2014 09:59     [id number] paced rhythm.   Impression  and Recommendations  1.Chest Pain: Typical and atypical feat.ures originally began coming and going a couple of weeks ago, but has been constant over the last 3 days, described as pressure "someone sitting on my chest" with associated dyspnea. first troponin 0.11. EKG revealed paced rhythm. She has been given 40 mEq of potassium by mouth times one, I will give her a second dose. She had not been taking her potassium supplement along with diuretic. Will admit for observation, cycle cardiac enzymes. We will repeat echocardiogram as it was ordered in February and did not get completed.  2. Permanent atrial fibrillation: CHADS VASC Score of 4.  Due to renal insufficiency remains on Eliquis 2.5 mg twice a day. Heart rate is controlled on carvedilol 6.25 mg twice a day.  3. Anemia: Patient has a history of anemia with blood transfusions in the past. She denies any hemoptysis, hematemesis, or melanoma. Recommend guaiac stools. She has been seen by GI in the past. She was scheduled for an EGD in February 2016. This was not completed for uncertain reasons. She did have a history of an EGD which was completed in June 2014 revealing severe ulcerative reflux  esophagitis with gastric erosive esophagitis with a single deep antral ulcer. She was taken off of NSAIDs and placed on a PPI. Consider GI evaluation if clinically indicated. She is on ferrous sulfate 25 mg tablet by mouth daily per last note.   4. [employer]tr[employer] pacemaker in situ: Recently remotely checked in May 2016.  5. Hx of NICM: Repeat echocardiogram to evaluate for systolic function, as requested by cardiology in February 2016. The patient has been on Lasix, 120 mg each morning, and an additional 80 mg 4 hours later. Creatinine this admission 1.72. No evidence of diastolic CHF. No evidence of CHF on chest x-ray.   Signed: Phill Myron. Lawrence NP [place]  11/28/2014, 11:32 AM Co-Sign MD  Patient seen and discussed with NP Lawerence, I agree with her documentation. 81 yo female history of afib, complete heart block with pacemaker, chronic diastolic HF, anemia admitted with chest pain. Reports pain in midchest and between shoulder blade, recently has been constant for several days without relief. Chronic SOB/DOE this is unchanged. Pain is not positional.    Admission vitals: 111/56 p 72 97% RA K 2.9, Cr 1.72 (up from 1.2-1.3), BUN 22, WBC 11.2, Hgb 8.5, Mg 1.9,  Trop 0.11-->0.09--> CXR no acute process 2012 Echo: LVEF 50-55%, mild inferior wall hypokinesis, mod MR, mild to mod TR,  EKG V-paced  Admitted with atypical chest pain. Pain on my exam is reproducible with palpation of midchest and between her shoulder blades  Mild troponin elevation that is now trending down, EKG is v-paced and cannot be interpreted for ischemia. Awaiting echo results to help further risk stratify. Likely consider noninvasive ischemic testing tomorrow AM, would have high threshold for cath given her renal dysfunction and anemia. Make NPO overnight in case testing indicated.  Multiple metabolic derangements including hypokalemia, AKI, anemia, and leukocytosis. Defer management to primary team. Labs would suggest she is  dry, will hold her lasix.   Zandra Abts MD

## 2014-11-28 NOTE — ED Notes (Signed)
EMS reports pt c/o chest pressure and upper back pain since 6pm last night.  EMS reports pt refused IV so they did not administer nitro.  Reports SOB.

## 2014-11-28 NOTE — H&P (Signed)
[facility] History and Physical  Kimberly [name] [id number] DOB: [birth year] DOA: 11/28/2014  Referring physician: [email or web address] PCP: Wardell Honour, MD   Chief Complaint: chest pain  HPI: Kimberly [name] is a 81 y.o. female with a past medical history that includes permanent atrial fibrillation status post AV node ablation, complete heart block status post pacemaker, chronic diastolic heart failure, chronic anemia, chronic kidney disease stage III presents emergency room with the chief complaint of chest pain. Initial evaluation reveals elevated troponin, mild leukocytosis, anemia, hypokalemia.   Information is obtained from the patient who reports chronic chest pain but called 911 this morning as this pain worsened. She describes the pain as a "pressure" located epigastric area radiating to her mid back area. She states the worsening chest pain started off and on "a couple of weeks ago". Associated symptoms include nausea without vomiting, diaphoresis and worsening shortness of breath. She denies headache visual disturbances numbness tingling of extremities. She denies syncope or near-syncope. She denies worsening lower extremity edema or orthopnea. She denies abdominal pain fever chills recent sick contacts. She does endorse some chronic "indigestion". She denies dysuria hematuria frequency or urgency. She denies diarrhea constipation melanoma. She denies any recent unintentional weight loss.  Workup in the emergency department reveals sodium 133 potassium 2.9 chloride 99 creatinine 1.72 serum glucose 108. WBCs 11.2 hemoglobin 8.5, initial troponin 0.11, chest x-ray with no active disease, EKG shows paced rhythm She was afebrile hemodynamically stable and not hypoxic. She was provided with a nitroglycerin patch, 40 mg protonic and 40 mEq of potassium orally as well as 2 mg of morphine intravenously.  Review of Systems:  And point review of systems completed and all systems are negative except  as indicated in the history of present illness   Past Medical History  Diagnosis Date  . Depression   . Atrial fibrillation   . Scoliosis   . Osteoarthritis   . Pacemaker     Last saw cards 07/2013  . Fibromyalgia   . Hypertension   . Glaucoma   . Acute diverticulitis 08/24/2013  . CKD (chronic kidney disease) stage 3, GFR 30-59 ml/min 10/12/2012  . Chronic anticoagulation 10/12/2012  . Acute blood loss anemia 10/11/2012  . Antral ulcer 10/11/2012  . Erosive esophagitis 10/11/2012  . Cardiomyopathy, nonischemic   . Acute on chronic combined systolic and diastolic CHF, NYHA class 4 11/15/2013  . Arrhythmia     atrial fibb  . H/O echocardiogram 2007    EF 40-45%,          Past Surgical History  Procedure Laterality Date  . Pacemaker insertion    . Abdominal hysterectomy    . Cholecystectomy    . Appendectomy    . Breast surgery    . Back surgery    . Neck surgery    . Hernia repair      right inguinal hernia and umbilical  . Tonsillectomy    . Esophagogastroduodenoscopy N/A 10/13/2012    Dr. Gala Romney: severe ulcerative reflux esophagitis, question of Barrett's but negative path, single deep prepyloric antral ulcer, negative H.pylori  . Cystoscopy N/A 02/24/2013    Procedure: CYSTOSCOPY WITH URETHRAL DILITATION;  Surgeon: Marissa Nestle, MD;  Location: [facility];  Service: Urology;  Laterality: N/A;  . Doppler echocardiography N/A 05/30/2010    LV SIZE IS NORMAL. LV SYSTOLIC FUNCTION IS LOW NORMAL. EF=50-55%. MILD INFERIOR HYPOKINESIS.MILD TO MODERATE POSTERIOR WALL HYPOKINESIS.PACEMAKER LEAD IN THE RV. LA IS MILDLY DILATED. RA IS MODERATE TO  SEVERLY DILATED. PACEMAKER LEAD IN THE RA. MILD CALCICICATION OF THE MV APPARATUS. MODERATE MR. MILD TO MODERATE TR. MILD PHTN.AV MILDLY SCLEROTIC.  . Nuclear stress test N/A 02/13/2009    NORMAL PATTERN OF PERFUSION IN ALL REGIONS. POST STRESS VENTICULAR SIZE IS NORMAL. POST  STESS EF 85%.  NORMAL MYOCARDIAL PERFUSION STUDY.  . Cardiac  catheterization  12/08/2005    LAD AND LEFT MAIN WITH NO HIGH-GRADE STENOSIS. MILD DISEASE IN THE CX AND LAD SYSTEM. SEVERE LV DYSFUNCTION WITH DILATION OF THE LV. EF 15-20%. LV END-DIASTOLIC PRESSURE IS 90. +1 MR.  Fawn Kirk ext venous Bilateral 11-08-10    R & L- NO EVIDENCE OF THROMBUS OR THROMBOPHLEBITIS. THERE IS MILD AMOUNT OF SUBCUTANEOUS EDEMA NOTED WITHIN THE LEFT CALF AND ANKLE. R & L GSV AND SSV- NO VENOUS INSUFF NOTED.  . Yag laser application Bilateral [id number]    Procedure: YAG LASER APPLICATION;  Surgeon: Williams Che, MD;  Location: [facility];  Service: Ophthalmology;  Laterality: Bilateral;   Social History:  reports that she quit smoking about 9 years ago. Her smoking use included Cigarettes. She has a 15 pack-year smoking history. She does not have any smokeless tobacco history on file. She reports that she drinks alcohol. She reports that she does not use illicit drugs. 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 Iron Swelling    Ferrous Sulfate - tongue swelling   . Iodine Rash and Other (See Comments)    REACTION:If injected,  Rash/irritated skin reaction "welts"  . Penicillins Hives  . Sulfa Antibiotics Rash    Family History  Problem Relation Age of Onset  . Cancer Sister   . Asthma Sister   . Heart failure Brother   . Colon cancer Neg Hx     Prior to Admission medications   Medication Sig Start Date End Date Taking? Authorizing Provider  apixaban (ELIQUIS) 2.5 MG TABS tablet Take 1 tablet by mouth  twice a day Patient taking differently: Take 2.5 mg by mouth 2 (two) times daily.  05/19/14  Yes Mihai Croitoru, MD  carvedilol (COREG) 6.25 MG tablet Take 1 tablet (6.25 mg total) by mouth 2 (two) times daily with a meal. 12/01/13  Yes Mihai Croitoru, MD  furosemide (LASIX) 20 MG tablet Take 60 mg by mouth daily.   Yes Historical Provider, MD  potassium chloride SA (K-DUR,KLOR-CON) 20 MEQ  tablet Take 1 tablet (20 mEq total) by mouth 3 (three) times daily. Patient taking differently: Take 20 mEq by mouth daily.  06/07/14  Yes Claretta Fraise, MD  HYDROcodone-acetaminophen (NORCO/VICODIN) 5-325 MG per tablet Take 1-2 tablets by mouth every 6 (six) hours as needed for moderate pain. Patient not taking: Reported on 11/28/2014 08/06/14 09/05/14  Claretta Fraise, MD   Physical Exam: Filed Vitals:   11/28/14 1130 11/28/14 1200 11/28/14 1215 11/28/14 1230  BP: 81/72 107/57  103/53  Pulse:   70   Temp:      TempSrc:      Resp: 22 20 19 21   Height:      Weight:      SpO2:   99%     Wt Readings from Last 3 Encounters:  11/28/14 63.504 kg (140 lb)  06/30/14 63.776 kg (140 lb 9.6 oz)  06/14/14 63.594 kg (140 lb 3.2 oz)    General:  Appears calm and comfortable, early pale  Eyes: PERRL, normal lids, irises & conjunctiva  ENT: grossly normal hearing, lips & tongue Mucus membranes of her mouth are slightly pale but moist  Neck: no LAD, masses or thyromegaly Cardiovascular: RRR, no m/r/g. Trace  LE edema on the left.  Respiratory:  Normal respiratory effort somewhat shallow, breath sounds with bilateral fine crackles in the bases particularly on the left no wheeze.  Abdomen: soft, ntnd As of bowel sounds. Nontender to palpation no mass organomegaly noted  Skin: no rash or induration seen on limited exam Musculoskeletal: grossly normal tone BUE/BLE Psychiatric: grossly normal mood and affect, speech fluent and appropriate Neurologic: grossly non-focal. Speech clear facial symmetry           Labs on Admission:  Basic Metabolic Panel:  Recent Labs Lab 11/28/14 1018  NA 133*  K 2.9*  CL 99*  CO2 23  GLUCOSE 108*  BUN 22*  CREATININE 1.72*  CALCIUM 8.2*   Liver Function Tests:  Recent Labs Lab 11/28/14 1018  AST 21  ALT 11*  ALKPHOS 49  BILITOT 0.9  PROT 5.7*  ALBUMIN 2.8*    Recent Labs Lab 11/28/14 1018  LIPASE 20*   No results for input(s): AMMONIA in the last  168 hours. CBC:  Recent Labs Lab 11/28/14 1018  WBC 11.2*  NEUTROABS 9.4*  HGB 8.5*  HCT 25.3*  MCV 84.9  PLT 242   Cardiac Enzymes:  Recent Labs Lab 11/28/14 1018  TROPONINI 0.11*    BNP (last 3 results)  Recent Labs  06/07/14 0931  BNP 476.5*    ProBNP (last 3 results) No results for input(s): PROBNP in the last 8760 hours.  CBG: No results for input(s): GLUCAP in the last 168 hours.  Radiological Exams on Admission: Dg Chest Portable 1 View  11/28/2014   CLINICAL DATA:  Shortness of breath, chest pressure since last night.  EXAM: PORTABLE CHEST - 1 VIEW  COMPARISON:  Sep 16, 2013  FINDINGS: The heart size and mediastinal contours are stable. Cardiac pacemaker is unchanged. Heart size is enlarged. There is no focal infiltrate, pulmonary edema, or pleural effusion. The visualized skeletal structures are stable.  IMPRESSION: No active cardiopulmonary disease.   Electronically Signed   By: Abelardo Diesel M.D.   On: 11/28/2014 09:59    EKG: Independently reviewed a store rhythm  Assessment/Plan Principal Problem:   Chest pain: Some typical and atypical features. Elevated troponin. Evaluated by cardiology in the emergency department who recommend admitting for observation cycling cardiac enzymes and repeating an echocardiogram. Will repeat EKG in the morning. Provide supportive therapy in the form of nitroglycerin oxygen supplementation and morphine as needed. Monitor closely in the stepdown unit. The time of admission patient reports chest pain "much improved". Active Problems:   Hypokalemia: Likely due to noncompliance as patient admits to "neglecting" her potassium supplements daily. Check a magnesium level. Will replete and recheck in the morning.  Nonischemic cardiomyopathy: She does not appear volume overloaded on admission. Repeat echocardiogram ordered by cardiology. Home medications include Lasix 120 mg in the morning and 80 mg 4 hours later. Chest x-ray without  acute cardiopulmonary process. Creatinine is 1.7 but chart review indicates this is fairly close to her baseline. I'm going to continue Lasix 60 mg daily is on exam she has fine crackles. On its her intake and output obtain daily weights   Anemia: Patient with history of same lever current hemoglobin of 8.5 is well below 12.0 which is what is documented 6 months ago. She denies any bright red blood per rectum or  melanoma. She denies any coffee ground emesis or hemoptysis. She reports being a [facility] hospital and getting a transfusion in the past but denies any diagnosis of GI bleed. Will obtain an anemia panel and FOBT. She is on Elaquis.     Permanent atrial fibrillation: Chart review indicates she is on2.5mg  Eliquis BID due to chronic kidney disease.CHADS VASC score of 4. Patient reports compliance with this particular medication. She took it this morning. He is also on low-dose Coreg. I'm going to hold this for now as her blood pressure slightly soft.    Biventricular cardiac pacemaker - chart review indicates checked remotely in a of this year.    Hypertension: Home medications include Coreg, Lasix. I'm going to hold the Coreg and give Lasix at a lower dose for now as her blood pressure is a little soft. Will monitor closely and adjust as indicated    CKD (chronic kidney disease) stage 3, GFR 30-59 ml/min: Creatinine 1.7. Chart review indicates this is very close to her baseline. Will provide lower dose Lasix monitor her urine output obtain daily weights.   Cardiology  Code Status: DNR DVT Prophylaxis: Family Communication: [name] daughter and [facility]  Phone # [phone] Disposition Plan: home hopefully 24 hours  Time spent: 72 hours  [facility] Pager [phone]

## 2014-11-29 ENCOUNTER — Observation Stay (HOSPITAL_COMMUNITY): Payer: Medicare Other

## 2014-11-29 ENCOUNTER — Encounter (HOSPITAL_COMMUNITY): Payer: Self-pay

## 2014-11-29 DIAGNOSIS — N183 Chronic kidney disease, stage 3 (moderate): Secondary | ICD-10-CM

## 2014-11-29 DIAGNOSIS — R079 Chest pain, unspecified: Secondary | ICD-10-CM

## 2014-11-29 DIAGNOSIS — I10 Essential (primary) hypertension: Secondary | ICD-10-CM | POA: Diagnosis not present

## 2014-11-29 DIAGNOSIS — I129 Hypertensive chronic kidney disease with stage 1 through stage 4 chronic kidney disease, or unspecified chronic kidney disease: Secondary | ICD-10-CM | POA: Diagnosis not present

## 2014-11-29 DIAGNOSIS — N189 Chronic kidney disease, unspecified: Secondary | ICD-10-CM

## 2014-11-29 DIAGNOSIS — D631 Anemia in chronic kidney disease: Secondary | ICD-10-CM

## 2014-11-29 DIAGNOSIS — R0789 Other chest pain: Secondary | ICD-10-CM | POA: Diagnosis not present

## 2014-11-29 DIAGNOSIS — I5043 Acute on chronic combined systolic (congestive) and diastolic (congestive) heart failure: Secondary | ICD-10-CM | POA: Diagnosis not present

## 2014-11-29 LAB — CBC
HEMATOCRIT: 24.7 % — AB (ref 36.0–46.0)
HEMOGLOBIN: 8 g/dL — AB (ref 12.0–15.0)
MCH: 28.1 pg (ref 26.0–34.0)
MCHC: 32.4 g/dL (ref 30.0–36.0)
MCV: 86.7 fL (ref 78.0–100.0)
Platelets: 208 10*3/uL (ref 150–400)
RBC: 2.85 MIL/uL — ABNORMAL LOW (ref 3.87–5.11)
RDW: 16.6 % — ABNORMAL HIGH (ref 11.5–15.5)
WBC: 8.1 10*3/uL (ref 4.0–10.5)

## 2014-11-29 LAB — NM MYOCAR MULTI W/SPECT W/WALL MOTION / EF
CHL CUP NUCLEAR SRS: 1
CHL CUP NUCLEAR SSS: 4
CSEPPHR: 81 {beats}/min
LV dias vol: 61 mL
LV sys vol: 17 mL
NUC STRESS TID: 1.08
RATE: 0.08
Rest HR: 72 {beats}/min
SDS: 3

## 2014-11-29 LAB — BASIC METABOLIC PANEL
Anion gap: 8 (ref 5–15)
BUN: 26 mg/dL — ABNORMAL HIGH (ref 6–20)
CO2: 22 mmol/L (ref 22–32)
CREATININE: 1.62 mg/dL — AB (ref 0.44–1.00)
Calcium: 7.9 mg/dL — ABNORMAL LOW (ref 8.9–10.3)
Chloride: 106 mmol/L (ref 101–111)
GFR calc non Af Amer: 28 mL/min — ABNORMAL LOW (ref >60)
GFR, EST AFRICAN AMERICAN: 32 mL/min — AB (ref >60)
GLUCOSE: 100 mg/dL — AB (ref 65–99)
POTASSIUM: 4.6 mmol/L (ref 3.5–5.1)
Sodium: 136 mmol/L (ref 135–145)

## 2014-11-29 MED ORDER — POTASSIUM CHLORIDE CRYS ER 20 MEQ PO TBCR: 20.0000 meq | EXTENDED_RELEASE_TABLET | Freq: Every day | ORAL | Status: DC

## 2014-11-29 MED ORDER — ATORVASTATIN CALCIUM 40 MG PO TABS: 40.0000 mg | ORAL_TABLET | Freq: Every day | ORAL | Status: DC

## 2014-11-29 MED ORDER — FUROSEMIDE 20 MG PO TABS: 40.0000 mg | ORAL_TABLET | Freq: Every day | ORAL | Status: DC

## 2014-11-29 MED ORDER — TECHNETIUM TC 99M SESTAMIBI GENERIC - CARDIOLITE
30.0000 | Freq: Once | INTRAVENOUS | Status: AC | PRN
  Administered 2014-11-29: 31.5 via INTRAVENOUS

## 2014-11-29 MED ORDER — CARVEDILOL 6.25 MG PO TABS: 6.2500 mg | ORAL_TABLET | Freq: Two times a day (BID) | ORAL | Status: DC

## 2014-11-29 MED ORDER — SODIUM CHLORIDE 0.9 % IJ SOLN
INTRAMUSCULAR | Status: AC
  Administered 2014-11-29: 10 mL via INTRAVENOUS
  Filled 2014-11-29: qty 3

## 2014-11-29 MED ORDER — TECHNETIUM TC 99M SESTAMIBI - CARDIOLITE
10.0000 | Freq: Once | INTRAVENOUS | Status: AC | PRN
  Administered 2014-11-29: 07:00:00 9 via INTRAVENOUS

## 2014-11-29 MED ORDER — REGADENOSON 0.4 MG/5ML IV SOLN
INTRAVENOUS | Status: AC
  Administered 2014-11-29: 0.4 mg via INTRAVENOUS
  Filled 2014-11-29: qty 5

## 2014-11-29 NOTE — Discharge Summary (Signed)
Physician Discharge Summary  Kimberly [name] [id number] DOB: [birth year] DOA: 11/28/2014  PCP: Wardell Honour, MD  Admit date: 11/28/2014 Discharge date: 11/29/2014  Time spent: 40 minutes  Recommendations for Outpatient Follow-up:  1. PCP  1 week for evaluation of kidney function and anemia, evaluation of BP  Discharge Diagnoses:  Principal Problem:   Chest pain Active Problems:   Permanent atrial fibrillation   Biventricular cardiac pacemaker - [employer] Consulta   Essential hypertension   CKD (chronic kidney disease) stage 3, GFR 30-59 ml/min   Anemia   Hypokalemia   Non-ischemic cardiomyopathy   Elevated troponin I level   Discharge Condition: stable  Diet recommendation: heart healthy  Filed Weights   11/28/14 0944 11/28/14 1620 11/29/14 0445  Weight: 63.504 kg (140 lb) 69.5 kg (153 lb 3.5 oz) 72.2 kg (159 lb 2.8 oz)    History of present illness:  Kimberly [name] is a 81 y.o. female with a past medical history that includes permanent atrial fibrillation status post AV node ablation, complete heart block status post pacemaker, chronic diastolic heart failure, chronic anemia, chronic kidney disease stage III presented to emergency room on 11/28/14 with the chief complaint of chest pain. Initial evaluation revealed elevated troponin, mild leukocytosis, anemia, hypokalemia.  Information  obtained from the patient who reported chronic chest pain but called 911 this morning as this pain worsened. She described the pain as a "pressure" located epigastric area radiating to her mid back area. She stated the worsening chest pain started off and on "a couple of weeks ago". Associated symptoms included nausea without vomiting, diaphoresis and worsening shortness of breath. She denied headache visual disturbances numbness tingling of extremities. She denied syncope or near-syncope. She denied worsening lower extremity edema or orthopnea. She denied abdominal pain fever chills recent sick  contacts. She endorsed some chronic "indigestion". She denied dysuria hematuria frequency or urgency. She denied diarrhea constipation melanoma. She denied any recent unintentional weight loss.  Workup in the emergency department revealed sodium 133 potassium 2.9 chloride 99 creatinine 1.72 serum glucose 108. WBCs 11.2 hemoglobin 8.5, initial troponin 0.11, chest x-ray with no active disease, EKG shows paced rhythm She was afebrile hemodynamically stable and not hypoxic. She was provided with a nitroglycerin patch, 40 mg protonic and 40 mEq of potassium orally as well as 2 mg of morphine intravenously.  Hospital Course:  Chest pain: Atypical with elevated troponin. Troponin trended down in setting of AKI and anemia. No events on tele. EKG without acute changes.  Pain resolved.  Evaluated by cardiology who opine pain reproducible. She underwent nuclear stress test negative for ischemia. echow with normal LVEF. Recommended starting statin and continue Eliquis.  Active Problems:  Hypokalemia: Likely due to noncompliance as patient admits to "neglecting" her potassium supplements but takes lasix. Magnesium level within limits of normal. Resolved at discharge with IV fluids and holding lasix. Will hold lasix until 12/03/14. Follow up with PCP 1-2 weeks for BMET to track potassium  Nonischemic cardiomyopathy: She did not appear volume overloaded on admission. Echocardiogram with EF 60% and no wall abnormalities. Chest x-ray without acute cardiopulmonary process. Creatinine is 1.7 but chart review indicates this is fairly close to her baseline. Lasix held and creatinine 1.6 at discharge. Will hold lasix until 12/03/14.   Anemia: normocytic. Likely IDA in chronic illness. She denied any bright red blood per rectum or melanoma. She denied any coffee ground emesis or hemoptysis. She is on Elaquis. OP follow up   Permanent atrial fibrillation:  Chart review indicates she is on2.5mg  Eliquis BID due to chronic  kidney disease.CHADS VASC score of 4. Patient reported compliance with this particular medication. Instructed to hole coreg for 12/03/14 for soft BP   Biventricular cardiac pacemaker - chart review indicates checked remotely in a of this year.   Hypertension: Home medications include Coreg, Lasix. Coreg and  Lasix held for soft BP. Patient instructed to hold both until 12/03/14.    CKD (chronic kidney disease) stage 3, GFR 30-59 ml/min: Creatinine 1.7 on admission.  Chart review indicates this is very close to her baseline. Trending down at discharge OP followup    Procedures:  echoWall thickness was normal. Systolic function was normal. The estimated ejection fraction was in the range of 60% to 65%. Wall motion was normal; there were no regional wall motion abnormalities. - Aortic valve: Mildly calcified annulus. Trileaflet; mildly thickened leaflets. Valve area (VTI): 1.59 cm^2. Valve area  11/29/14 stress tes  Consultations:  cardiology  Discharge Exam: Filed Vitals:   11/29/14 1403  BP: 129/62  Pulse: 66  Temp:   Resp: 18    General: thin somewhat frail appearing irritable Cardiovascular: RRR no MGR No LE edema Respiratory: normal effort BS clear bilaterally no wheeze  Discharge Instructions   Discharge Instructions    Diet - low sodium heart healthy    Complete by:  As directed      Increase activity slowly    Complete by:  As directed           Discharge Medication List as of 11/29/2014  4:08 PM    START taking these medications   Details  atorvastatin (LIPITOR) 40 MG tablet Take 1 tablet (40 mg total) by mouth daily at 6 PM., Starting 11/29/2014, Until Discontinued, Print      CONTINUE these medications which have CHANGED   Details  carvedilol (COREG) 6.25 MG tablet Take 1 tablet (6.25 mg total) by mouth 2 (two) times daily with a meal. Resume on 7/29, Starting 11/29/2014, Until Discontinued, No Print    furosemide (LASIX) 20 MG tablet Take 2  tablets (40 mg total) by mouth daily. Resume on 7/31, Starting 11/29/2014, Until Discontinued, No Print    potassium chloride SA (K-DUR,KLOR-CON) 20 MEQ tablet Take 1 tablet (20 mEq total) by mouth daily., Starting 11/29/2014, Until Discontinued, Normal      CONTINUE these medications which have NOT CHANGED   Details  apixaban (ELIQUIS) 2.5 MG TABS tablet Take 1 tablet by mouth  twice a day, Normal      STOP taking these medications     HYDROcodone-acetaminophen (NORCO/VICODIN) 5-325 MG per tablet        Allergies  Allergen Reactions  . Penicillins Hives  . Sulfa Antibiotics Rash   Follow-up Information    Follow up with Wardell Honour, MD. Call in 1 week.   Specialty:  Family Medicine   Why:  evaluate functional status    Contact information:   [address] [place] [phone]        The results of significant diagnostics from this hospitalization (including imaging, microbiology, ancillary and laboratory) are listed below for reference.    Significant Diagnostic Studies: Nm Myocar Multi W/spect W/wall Motion / Ef  11/29/2014    The study is normal.  This is a low risk study.  The left ventricular ejection fraction is hyperdynamic (>65%).  Myocardial perfusion is normal    Dg Chest Portable 1 View  11/28/2014   CLINICAL DATA:  Shortness of breath, chest pressure since last night.  EXAM: PORTABLE CHEST - 1 VIEW  COMPARISON:  Sep 16, 2013  FINDINGS: The heart size and mediastinal contours are stable. Cardiac pacemaker is unchanged. Heart size is enlarged. There is no focal infiltrate, pulmonary edema, or pleural effusion. The visualized skeletal structures are stable.  IMPRESSION: No active cardiopulmonary disease.   Electronically Signed   By: Abelardo Diesel M.D.   On: 11/28/2014 09:59    Microbiology: Recent Results (from the past 240 hour(s))  MRSA PCR Screening     Status: None   Collection Time: 11/28/14  8:45 PM  Result Value Ref Range Status    MRSA by PCR NEGATIVE NEGATIVE Final    Comment:        The GeneXpert MRSA Assay (FDA approved for NASAL specimens only), is one component of a comprehensive MRSA colonization surveillance program. It is not intended to diagnose MRSA infection nor to guide or monitor treatment for MRSA infections.      Labs: Basic Metabolic Panel:  Recent Labs Lab 11/28/14 1018 11/28/14 1223 11/29/14 0440  NA 133*  --  136  K 2.9*  --  4.6  CL 99*  --  106  CO2 23  --  22  GLUCOSE 108*  --  100*  BUN 22*  --  26*  CREATININE 1.72*  --  1.62*  CALCIUM 8.2*  --  7.9*  MG  --  1.9  --    Liver Function Tests:  Recent Labs Lab 11/28/14 1018  AST 21  ALT 11*  ALKPHOS 49  BILITOT 0.9  PROT 5.7*  ALBUMIN 2.8*    Recent Labs Lab 11/28/14 1018  LIPASE 20*   No results for input(s): AMMONIA in the last 168 hours. CBC:  Recent Labs Lab 11/28/14 1018 11/29/14 0440  WBC 11.2* 8.1  NEUTROABS 9.4*  --   HGB 8.5* 8.0*  HCT 25.3* 24.7*  MCV 84.9 86.7  PLT 242 208   Cardiac Enzymes:  Recent Labs Lab 11/28/14 1018 11/28/14 1223 11/28/14 1530 11/28/14 2202  TROPONINI 0.11* 0.09* 0.09* 0.08*   BNP: BNP (last 3 results)  Recent Labs  06/07/14 0931  BNP 476.5*    ProBNP (last 3 results) No results for input(s): PROBNP in the last 8760 hours.  CBG: No results for input(s): GLUCAP in the last 168 hours.     SignedRadene Gunning  [facility] 11/29/2014, 4:26 PM

## 2014-11-29 NOTE — Progress Notes (Signed)
Subjective:  No further chest pain  Objective:  Vital Signs in the last 24 hours: Temp:  [98 F (36.7 C)-98.7 F (37.1 C)] 98.3 F (36.8 C) (07/27 0746) Pulse Rate:  [69-81] 70 (07/27 0600) Resp:  [15-27] 22 (07/27 0700) BP: (81-117)/(31-85) 109/46 mmHg (07/27 0700) SpO2:  [84 %-100 %] 100 % (07/27 0600) Weight:  [140 lb (63.504 kg)-159 lb 2.8 oz (72.2 kg)] 159 lb 2.8 oz (72.2 kg) (07/27 0445)  Intake/Output from previous day: 07/26 0701 - 07/27 0700 In: 1122 [P.O.:240; I.V.:882] Out: 475 [Urine:475] Intake/Output from this shift:    Physical Exam: NECK: Without JVD, HJR, or bruit LUNGS: Clear anterior, posterior, lateral HEART: Regular rate and rhythm, no murmur, gallop, rub, bruit, thrill, or heave EXTREMITIES: Without cyanosis, clubbing, or edema  Lab Results:  Recent Labs  11/28/14 1018 11/29/14 0440  WBC 11.2* 8.1  HGB 8.5* 8.0*  PLT 242 208    Recent Labs  11/28/14 1018 11/29/14 0440  NA 133* 136  K 2.9* 4.6  CL 99* 106  CO2 23 22  GLUCOSE 108* 100*  BUN 22* 26*  CREATININE 1.72* 1.62*    Recent Labs  11/28/14 1530 11/28/14 2202  TROPONINI 0.09* 0.08*   Hepatic Function Panel  Recent Labs  11/28/14 1018  PROT 5.7*  ALBUMIN 2.8*  AST 21  ALT 11*  ALKPHOS 49  BILITOT 0.9  BILIDIR 0.2  IBILI 0.7   No results for input(s): CHOL in the last 72 hours. No results for input(s): PROTIME in the last 72 hours.  Imaging:   Cardiac Studies: 2Decho 11/28/14 Study Conclusions  - Left ventricle: The cavity size was normal. Wall thickness was   normal. Systolic function was normal. The estimated ejection   fraction was in the range of 60% to 65%. Wall motion was normal;   there were no regional wall motion abnormalities. - Aortic valve: Mildly calcified annulus. Trileaflet; mildly   thickened leaflets. Valve area (VTI): 1.59 cm^2. Valve area   (Vmax): 1.8 cm^2. - Mitral valve: Mildly calcified annulus. Mildly thickened leaflets   . There  was mild regurgitation. - Left atrium: The atrium was severely dilated. - Right atrium: The atrium was severely dilated. - Pulmonary arteries: Systolic pressure was mildly to moderately   increased. PA peak pressure: 39 mm Hg (S). - Inferior vena cava: The vessel was dilated. The respirophasic   diameter changes were blunted (< 50%), consistent with elevated   central venous pressure. - Technically adequate study.   Assessment/Plan:  Atypical chest pain Troponins 0.11, 0.09, 0.09, 0.08. Lexiscan myoview performed-await scan  Chronic atrial fibrillation: CHADSVASC=4 on Eliquis 2.5 mg BID and carvedilol. Paced rhythm  Anemia Hbg 8.0  History of NICM EF 60-65%   LOS: 1 day    Kimberly [name] 11/29/2014, 9:40 AM   Patient seen and discussed with PA Bonnell Public, I agree with her documentation above. Atypical chest pain that was reproducible on exam. Nuclear stress test is negative for ischemia. Echo with normal LVEF and no WMAs. Mild troponin elevation trending down in setting of anemia, CKD and AKI, intermittent low bp's, and leukocytosis does not appear to be associated with significant ischemia. Continue medical therapy. Given her nonobstructive CAD noted in 2012 cath would start statin, start atorva 40mg  daily. Since on eliquis will not start ASA. Cr trending down off lasix and with IVF, would continue to hold, likely the etiology of her intermittent low bp's as well. No further cardiac testing planned at this time, will  sign off inpatient care.   Zandra Abts MD

## 2014-11-29 NOTE — Progress Notes (Signed)
Discharge instruction reviewed with patient. Prescription given to patient. No distress noted.

## 2014-11-29 NOTE — Evaluation (Signed)
Physical Therapy Evaluation Patient Details Name: Kimberly [name] MRN: [id number] DOB: [birth year] Today's Date: 11/29/2014   History of Present Illness  The patient is an 81 year old woman with a history of CAD with nonischemic cardiomyopathy, chronic diastolic heart failure status post pacemaker for heart block, permanent atrial fibrillation-on Eliquis, and stage III chronic kidney disease.The pPatient is an 81 year old woman with a history of CAD with nonischemic cardiomyopathy, chronic diastolic heart failure status post pacemaker for heart block, permanent atrial fibrillation-on Eliquis, and stage III chronic kidney disease.  Clinical Impression  Kimberly [name] appears to be close to her prior functional state but according to pt her functioning ability has been steadily going down for the past several months.  Kimberly [name] lives alone and will benefit from home health services to maximize her functional potential.     Follow Up Recommendations Home health PT    Equipment Recommendations    none   Recommendations for Other Services   none    Precautions / Restrictions Precautions Precautions: Fall Restrictions Weight Bearing Restrictions: No      Mobility  Bed Mobility Overal bed mobility: Modified Independent                Transfers Overall transfer level: Needs assistance Equipment used: 1 person hand held assist Transfers: Sit to/from Stand;Stand Pivot Transfers Sit to Stand: Supervision Stand pivot transfers: Supervision          Ambulation/Gait             General Gait Details: Pt refused to ambulate secondary to just coming back from stress test             Pertinent Vitals/Pain Pain Assessment: 0-10 Pain Score: 8  Pain Location: chest  Pain Descriptors / Indicators: [place] expects to be discharged to:: Private residence Living Arrangements: Alone Available Help at Discharge: Family;Friend(s) Type of Home:  House Home Access: Other (comment) (one step to get into her home.)     Home Layout: One level Home Equipment: Walker - 2 wheels;Bedside commode      Prior Function Level of Independence: Independent with assistive device(s)               Hand Dominance   Dominant Hand: Right    Extremity/Trunk Assessment               Lower Extremity Assessment: Generalized weakness         Communication   Communication: No difficulties  Cognition Arousal/Alertness: Awake/alert Behavior During Therapy: WFL for tasks assessed/performed Overall Cognitive Status: Within Functional Limits for tasks assessed                         Exercises General Exercises - Lower Extremity Ankle Circles/Pumps: 10 reps Quad Sets: 10 reps Gluteal Sets: 10 reps Long Arc Quad: 10 reps      Assessment/Plan    PT Assessment Patient needs continued PT services  PT Diagnosis Difficulty walking;Generalized weakness   PT Problem List Decreased activity tolerance  PT Treatment Interventions Gait training;Therapeutic activities;Therapeutic exercise   PT Goals (Current goals can be found in the Care Plan section)      Frequency Min 3X/week           End of Session   Activity Tolerance: Patient tolerated treatment well Patient left: in bed;with call bell/phone within reach      Functional Assessment Tool Used: clinical judgement based on bed mobility  and transfers  Functional Limitation: Changing and maintaining body position Changing and Maintaining Body Position Current Status [phone]): At least 1 percent but less than 20 percent impaired, limited or restricted Changing and Maintaining Body Position Goal Status [id number]): At least 1 percent but less than 20 percent impaired, limited or restricted Changing and Maintaining Body Position Discharge Status [phone]): At least 1 percent but less than 20 percent impaired, limited or restricted    Time: 1008-1031 PT Time Calculation  (min) (ACUTE ONLY): 23 min   Charges:         PT G Codes:   PT G-Codes **NOT FOR INPATIENT CLASS** Functional Assessment Tool Used: clinical judgement based on bed mobility and transfers  Functional Limitation: Changing and maintaining body position Changing and Maintaining Body Position Current Status [id number]): At least 1 percent but less than 20 percent impaired, limited or restricted Changing and Maintaining Body Position Goal Status [id number]): At least 1 percent but less than 20 percent impaired, limited or restricted Changing and Maintaining Body Position Discharge Status [phone]): At least 1 percent but less than 20 percent impaired, limited or restricted    Rayetta Humphrey, PT CLT [phone] 11/29/2014, 10:32 AM

## 2014-11-29 NOTE — Care Management Note (Signed)
Case Management Note  Patient Details  Name: Kimberly [name] MRN: [id number] Date of Birth: 06/23/1926  Expected Discharge Date:  12/01/14               Expected Discharge Plan:  Home/Self Care  In-House Referral:  NA  Discharge planning Services  CM Consult  Post Acute Care Choice:  NA Choice offered to:  NA  DME Arranged:    DME Agency:     HH Arranged:    [place] Agency:     Status of Service:  Completed, signed off  Medicare Important Message Given:    Date Medicare IM Given:    Medicare IM give by:    Date Additional Medicare IM Given:    Additional Medicare Important Message give by:     If discussed at [place] of Stay Meetings, dates discussed:    Additional Comments: Pt is from home and independent at baseline. Pt admitted obs for CP r/o. Pt given obs notificiation, document signed, copy given to patient and original placed on pt's chart. PT has recommended HH PT at DC but patient has refused HH. MD anticipates DC home today. No CM needs.  Sherald Barge, RN 11/29/2014, 2:16 PM

## 2014-12-04 ENCOUNTER — Encounter (HOSPITAL_COMMUNITY): Payer: Self-pay | Admitting: Emergency Medicine

## 2014-12-04 ENCOUNTER — Emergency Department (HOSPITAL_COMMUNITY)
Admission: EM | Admit: 2014-12-04 | Discharge: 2014-12-04 | Disposition: A | Discharge status: Home / Self Care | Payer: Medicare Other | Attending: Emergency Medicine | Admitting: Emergency Medicine

## 2014-12-04 ENCOUNTER — Emergency Department (HOSPITAL_COMMUNITY): Payer: Medicare Other

## 2014-12-04 DIAGNOSIS — N183 Chronic kidney disease, stage 3 (moderate): Secondary | ICD-10-CM | POA: Insufficient documentation

## 2014-12-04 DIAGNOSIS — R069 Unspecified abnormalities of breathing: Secondary | ICD-10-CM | POA: Diagnosis not present

## 2014-12-04 DIAGNOSIS — I509 Heart failure, unspecified: Secondary | ICD-10-CM

## 2014-12-04 DIAGNOSIS — I4891 Unspecified atrial fibrillation: Secondary | ICD-10-CM | POA: Insufficient documentation

## 2014-12-04 DIAGNOSIS — Z79899 Other long term (current) drug therapy: Secondary | ICD-10-CM | POA: Insufficient documentation

## 2014-12-04 DIAGNOSIS — Z87891 Personal history of nicotine dependence: Secondary | ICD-10-CM | POA: Diagnosis not present

## 2014-12-04 DIAGNOSIS — M797 Fibromyalgia: Secondary | ICD-10-CM | POA: Diagnosis not present

## 2014-12-04 DIAGNOSIS — M1990 Unspecified osteoarthritis, unspecified site: Secondary | ICD-10-CM | POA: Insufficient documentation

## 2014-12-04 DIAGNOSIS — J449 Chronic obstructive pulmonary disease, unspecified: Secondary | ICD-10-CM | POA: Diagnosis not present

## 2014-12-04 DIAGNOSIS — I129 Hypertensive chronic kidney disease with stage 1 through stage 4 chronic kidney disease, or unspecified chronic kidney disease: Secondary | ICD-10-CM | POA: Insufficient documentation

## 2014-12-04 DIAGNOSIS — I5043 Acute on chronic combined systolic (congestive) and diastolic (congestive) heart failure: Secondary | ICD-10-CM | POA: Diagnosis not present

## 2014-12-04 DIAGNOSIS — R0602 Shortness of breath: Secondary | ICD-10-CM | POA: Diagnosis not present

## 2014-12-04 DIAGNOSIS — Z880 Allergy status to penicillin: Secondary | ICD-10-CM | POA: Diagnosis not present

## 2014-12-04 DIAGNOSIS — R0600 Dyspnea, unspecified: Secondary | ICD-10-CM | POA: Diagnosis not present

## 2014-12-04 DIAGNOSIS — Z951 Presence of aortocoronary bypass graft: Secondary | ICD-10-CM | POA: Insufficient documentation

## 2014-12-04 LAB — BASIC METABOLIC PANEL
Anion gap: 9 (ref 5–15)
BUN: 23 mg/dL — AB (ref 6–20)
CO2: 24 mmol/L (ref 22–32)
Calcium: 8.3 mg/dL — ABNORMAL LOW (ref 8.9–10.3)
Chloride: 104 mmol/L (ref 101–111)
Creatinine, Ser: 1.27 mg/dL — ABNORMAL HIGH (ref 0.44–1.00)
GFR calc Af Amer: 43 mL/min — ABNORMAL LOW (ref >60)
GFR calc non Af Amer: 37 mL/min — ABNORMAL LOW (ref >60)
GLUCOSE: 102 mg/dL — AB (ref 65–99)
Potassium: 4.2 mmol/L (ref 3.5–5.1)
SODIUM: 137 mmol/L (ref 135–145)

## 2014-12-04 LAB — CBC WITH DIFFERENTIAL/PLATELET
Basophils Absolute: 0 10*3/uL (ref 0.0–0.1)
Basophils Relative: 0 % (ref 0–1)
EOS ABS: 0.2 10*3/uL (ref 0.0–0.7)
Eosinophils Relative: 2 % (ref 0–5)
HEMATOCRIT: 27.2 % — AB (ref 36.0–46.0)
Hemoglobin: 8.5 g/dL — ABNORMAL LOW (ref 12.0–15.0)
LYMPHS PCT: 14 % (ref 12–46)
Lymphs Abs: 1.1 10*3/uL (ref 0.7–4.0)
MCH: 27.1 pg (ref 26.0–34.0)
MCHC: 31.3 g/dL (ref 30.0–36.0)
MCV: 86.6 fL (ref 78.0–100.0)
MONOS PCT: 9 % (ref 3–12)
Monocytes Absolute: 0.7 10*3/uL (ref 0.1–1.0)
Neutro Abs: 5.6 10*3/uL (ref 1.7–7.7)
Neutrophils Relative %: 74 % (ref 43–77)
PLATELETS: 393 10*3/uL (ref 150–400)
RBC: 3.14 MIL/uL — ABNORMAL LOW (ref 3.87–5.11)
RDW: 16.4 % — ABNORMAL HIGH (ref 11.5–15.5)
WBC: 7.5 10*3/uL (ref 4.0–10.5)

## 2014-12-04 LAB — BRAIN NATRIURETIC PEPTIDE: B NATRIURETIC PEPTIDE 5: 1480 pg/mL — AB (ref 0.0–100.0)

## 2014-12-04 LAB — TROPONIN I: TROPONIN I: 0.09 ng/mL — AB (ref <0.031)

## 2014-12-04 NOTE — ED Notes (Signed)
Patient complaining of shortness of breath and congestion since yesterday. States she was discharged 8/27 from [facility]. Denies pain.

## 2014-12-04 NOTE — ED Provider Notes (Signed)
CSN: [id number]     Arrival date & time 12/04/14  0902 History   This chart was scribed for Kimberly Furry, MD by Thea Alken, ED Scribe. This patient was seen in room APA14/APA14 and the patient's care was started at 9:22 AM.  Chief Complaint  Patient presents with  . Shortness of Breath   The history is provided by the patient. No language interpreter was used.    HPI Comments:  Kimberly [name] is a 81 y.o. female who present to the Emergency Department complaining of chest congestion that began yesterday. Pt has chest congestion that she is having difficultly clearing  She has been coughing intentionally to clear congestion without relief. She reports associated SOB, Leg swelling.[name] She was hospitalized 7/26 for chest pain .  She had minimal elevations of her cardiac enzymes thought to be secondary to chronic renal insufficiency. A myo- scan showed no coronary ischemia.. Her current CP is different from hospitalization.She was also started on Lipitor, but is not tolerating medication well and has not taken it for the past 2 days.   She does not use oxygen at home.  She denies fever.   Had previously been on Coreg and Lasix after a previous cardiac hospitalization and ultrasound showed EF of 15-20%. It had improved to 55%. Recent hospitalization and echocardiogram suggested EF between 50-60%.  Past Medical History  Diagnosis Date  . Depression   . Atrial fibrillation   . Scoliosis   . Osteoarthritis   . Pacemaker     Last saw cards 07/2013  . Fibromyalgia   . Hypertension   . Glaucoma   . Acute diverticulitis 08/24/2013  . CKD (chronic kidney disease) stage 3, GFR 30-59 ml/min 10/12/2012  . Chronic anticoagulation 10/12/2012  . Acute blood loss anemia 10/11/2012  . Antral ulcer 10/11/2012  . Erosive esophagitis 10/11/2012  . Cardiomyopathy, nonischemic   . Acute on chronic combined systolic and diastolic CHF, NYHA class 4 11/15/2013  . Arrhythmia     atrial fibb  . H/O echocardiogram 2007    EF  40-45%,          Past Surgical History  Procedure Laterality Date  . Pacemaker insertion    . Abdominal hysterectomy    . Cholecystectomy    . Appendectomy    . Breast surgery    . Back surgery    . Neck surgery    . Hernia repair      right inguinal hernia and umbilical  . Tonsillectomy    . Esophagogastroduodenoscopy N/A 10/13/2012    Dr. Gala Romney: severe ulcerative reflux esophagitis, question of Barrett's but negative path, single deep prepyloric antral ulcer, negative H.pylori  . Cystoscopy N/A 02/24/2013    Procedure: CYSTOSCOPY WITH URETHRAL DILITATION;  Surgeon: Marissa Nestle, MD;  Location: [facility];  Service: Urology;  Laterality: N/A;  . Doppler echocardiography N/A 05/30/2010    LV SIZE IS NORMAL. LV SYSTOLIC FUNCTION IS LOW NORMAL. EF=50-55%. MILD INFERIOR HYPOKINESIS.MILD TO MODERATE POSTERIOR WALL HYPOKINESIS.PACEMAKER LEAD IN THE RV. LA IS MILDLY DILATED. RA IS MODERATE TO SEVERLY DILATED. PACEMAKER LEAD IN THE RA. MILD CALCICICATION OF THE MV APPARATUS. MODERATE MR. MILD TO MODERATE TR. MILD PHTN.AV MILDLY SCLEROTIC.  . Nuclear stress test N/A 02/13/2009    NORMAL PATTERN OF PERFUSION IN ALL REGIONS. POST STRESS VENTICULAR SIZE IS NORMAL. POST  STESS EF 85%.  NORMAL MYOCARDIAL PERFUSION STUDY.  . Cardiac catheterization  12/08/2005    LAD AND LEFT MAIN WITH NO HIGH-GRADE STENOSIS.  MILD DISEASE IN THE CX AND LAD SYSTEM. SEVERE LV DYSFUNCTION WITH DILATION OF THE LV. EF 15-20%. LV END-DIASTOLIC PRESSURE IS 90. +1 MR.  Fawn Kirk ext venous Bilateral 11-08-10    R & L- NO EVIDENCE OF THROMBUS OR THROMBOPHLEBITIS. THERE IS MILD AMOUNT OF SUBCUTANEOUS EDEMA NOTED WITHIN THE LEFT CALF AND ANKLE. R & L GSV AND SSV- NO VENOUS INSUFF NOTED.  . Yag laser application Bilateral [id number]    Procedure: YAG LASER APPLICATION;  Surgeon: Williams Che, MD;  Location: [facility];  Service: Ophthalmology;  Laterality: Bilateral;   Family History  Problem Relation Age of Onset  . Cancer Sister    . Asthma Sister   . Heart failure Brother   . Colon cancer Neg Hx    History  Substance Use Topics  . Smoking status: Former Smoker -- 0.50 packs/day for 30 years    Types: Cigarettes    Quit date: 12/13/2004  . Smokeless tobacco: Not on file  . Alcohol Use: 0.0 oz/week    0 Glasses of wine per week     Comment: history of drinking a 1/2 glass of wine in the evening   OB History    Gravida Para Term Preterm AB TAB SAB Ectopic Multiple Living   5 2 1 1 3  3   2      Review of Systems  Constitutional: Negative for fever, chills, diaphoresis, appetite change and fatigue.  HENT: Negative for mouth sores, sore throat and trouble swallowing.   Eyes: Negative for visual disturbance.  Respiratory: Positive for shortness of breath. Negative for cough, chest tightness and wheezing.   Cardiovascular: Positive for chest pain, palpitations and leg swelling.  Gastrointestinal: Negative for nausea, vomiting, abdominal pain, diarrhea and abdominal distention.  Endocrine: Negative for polydipsia, polyphagia and polyuria.  Genitourinary: Negative for dysuria, frequency and hematuria.  Musculoskeletal: Negative for gait problem.  Skin: Negative for color change, pallor and rash.  Neurological: Negative for dizziness, syncope, light-headedness and headaches.  Hematological: Does not bruise/bleed easily.  Psychiatric/Behavioral: Negative for behavioral problems and confusion.    Allergies  Penicillins and Sulfa antibiotics  Home Medications   Prior to Admission medications   Medication Sig Start Date End Date Taking? Authorizing Provider  apixaban (ELIQUIS) 2.5 MG TABS tablet Take 1 tablet by mouth  twice a day Patient taking differently: Take 2.5 mg by mouth 2 (two) times daily.  05/19/14  Yes Mihai Croitoru, MD  atorvastatin (LIPITOR) 40 MG tablet Take 1 tablet (40 mg total) by mouth daily at 6 PM. 11/29/14  Yes Radene Gunning, NP  carvedilol (COREG) 6.25 MG tablet Take 1 tablet (6.25 mg  total) by mouth 2 (two) times daily with a meal. Resume on 7/29 11/29/14  Yes Kathie Dike, MD  furosemide (LASIX) 20 MG tablet Take 2 tablets (40 mg total) by mouth daily. Resume on 7/31 11/29/14  Yes Kathie Dike, MD  potassium chloride SA (K-DUR,KLOR-CON) 20 MEQ tablet Take 1 tablet (20 mEq total) by mouth daily. 11/29/14  Yes Lezlie Octave Black, NP   BP 144/75 mmHg  Pulse 74  Temp(Src) 98 F (36.7 C) (Oral)  Resp 16  Ht 5\' 5"  (1.651 m)  Wt 159 lb (72.122 kg)  BMI 26.46 kg/m2  SpO2 98% Physical Exam  Constitutional: She is oriented to person, place, and time. She appears well-developed and well-nourished. No distress.  HENT:  Head: Normocephalic.  Eyes: Conjunctivae are normal. Pupils are equal, round, and reactive to light. No scleral  icterus.  Neck: Normal range of motion. Neck supple. No thyromegaly present.  Cardiovascular: Normal rate.  An irregular rhythm present. Exam reveals no gallop and no friction rub.   No murmur heard. Pulmonary/Chest: Effort normal and breath sounds normal. No respiratory distress. She has no wheezes. She has no rales.  Crackles in bilateral bases.  Abdominal: Soft. Bowel sounds are normal. She exhibits no distension. There is no tenderness. There is no rebound.  Musculoskeletal: Normal range of motion.  Neurological: She is alert and oriented to person, place, and time.  Skin: Skin is warm and dry. No rash noted.  Psychiatric: She has a normal mood and affect. Her behavior is normal.    ED Course  Procedures (including critical care time) DIAGNOSTIC STUDIES: Oxygen Saturation is 98% on RA, normal by my interpretation.    COORDINATION OF CARE: 9:37 AM- Pt advised of plan for treatment and pt agrees.  Labs Review Labs Reviewed  TROPONIN I - Abnormal; Notable for the following:    Troponin I 0.09 (*)    All other components within normal limits  BASIC METABOLIC PANEL - Abnormal; Notable for the following:    Glucose, Bld 102 (*)    BUN 23 (*)     Creatinine, Ser 1.27 (*)    Calcium 8.3 (*)    GFR calc non Af Amer 37 (*)    GFR calc Af Amer 43 (*)    All other components within normal limits  CBC WITH DIFFERENTIAL/PLATELET - Abnormal; Notable for the following:    RBC 3.14 (*)    Hemoglobin 8.5 (*)    HCT 27.2 (*)    RDW 16.4 (*)    All other components within normal limits  BRAIN NATRIURETIC PEPTIDE - Abnormal; Notable for the following:    B Natriuretic Peptide 1480.0 (*)    All other components within normal limits    Imaging Review Dg Chest 2 View  12/04/2014   CLINICAL DATA:  Chest congestion beginning yesterday, shortness of breath, leg swelling  EXAM: CHEST  2 VIEW  COMPARISON:  11/28/2014  FINDINGS: LEFT subclavian pacemaker/AICD leads project over RIGHT ventricle and coronary sinus, unchanged.  Enlargement of cardiac silhouette.  Calcified tortuous thoracic aorta.  Mediastinal contours and pulmonary vascularity normal.  Emphysematous and bronchitic changes consistent with COPD.  Biapical scarring with pleural parenchymal calcification.  No definite acute infiltrate, pleural effusion or pneumothorax.  Bones diffusely demineralized.  IMPRESSION: Mild enlargement of cardiac silhouette post pacemaker/AICD.  COPD changes with biapical scarring.   Electronically Signed   By: Lavonia Dana M.D.   On: 12/04/2014 09:49     EKG Interpretation   Date/Time:  Monday December 04 2014 10:56:43 EDT Ventricular Rate:  60 PR Interval:    QRS Duration: 127 QT Interval:  386 QTC Calculation: 386 R Axis:   0 Text Interpretation:  Pacemaker spikes or artifacts Atrial fibrillation  Confirmed by Jeneen Rinks  MD, [place] ([id number]) on 12/04/2014 11:01:28 AM      MDM   Final diagnoses:  Dyspnea  Congestive heart failure, unspecified congestive heart failure chronicity, unspecified congestive heart failure type    With an EF of 60 would be remiss to describe this as congestive heart failure. She does have some basilar crackles and dependent edema,  and has developed dyspnea and a sensation of pulmonary congestion since being taken off of Coreg and Lasix.  She is oxygenating at 100%. Is symptom-free. Has a strong desire to be treated at home. Her troponin is  unchanged versus hospitalization as she has had interval normal nuclear medicine scanning. I think restarting a daily dose of Lasix is a reasonable plan. I asked her to recheck with her primary care physician this week. Astra recheck here with any worsening symptoms including chest pain, shortness of breath, or other worsening symptoms.  I personally performed the services described in this documentation, which was scribed in my presence. The recorded information has been reviewed and is accurate.    Kimberly Furry, MD 12/04/14 1125

## 2014-12-04 NOTE — Discharge Instructions (Signed)
Resume your Lasix at 20 mg once per day.  Routine follow up with your primary care physician, and Dr. Harl Bowie.  Return to ER with any worsening symptoms including worsening swelling, shortness of breath, or any pain in your chest.

## 2014-12-04 NOTE — ED Notes (Signed)
Called [facility] for transportation to [place].  Driver will call when able to pick her up.  Nurse informed.

## 2014-12-18 ENCOUNTER — Other Ambulatory Visit: Payer: Self-pay | Admitting: Cardiovascular Disease

## 2014-12-18 NOTE — Telephone Encounter (Signed)
Rx(s) sent to pharmacy electronically.  

## 2014-12-29 ENCOUNTER — Telehealth: Payer: Self-pay | Admitting: Cardiovascular Disease

## 2014-12-29 NOTE — Telephone Encounter (Signed)
Kimberly [name] is calling in stating that since the pt is apart of the heart failure clinic she needs the pt's most recent diagnosis and ejection fraction. This information can be faxed to [phone].please f/u with her   Thanks

## 2014-12-29 NOTE — Telephone Encounter (Signed)
Attempted to call [name] nurse back but no extension number was provided when call came in and was documented on. Unable to locate Kimberly [name].   Unable to reach Kimberly [name] to confirm fax number to safely fax confidential PHI

## 2015-01-01 ENCOUNTER — Encounter: Payer: Medicare Other | Admitting: *Deleted

## 2015-01-02 ENCOUNTER — Encounter: Payer: Self-pay | Admitting: Cardiology

## 2015-01-03 ENCOUNTER — Encounter: Payer: Self-pay | Admitting: *Deleted

## 2015-01-10 ENCOUNTER — Telehealth: Payer: Self-pay | Admitting: Cardiovascular Disease

## 2015-01-10 ENCOUNTER — Ambulatory Visit (INDEPENDENT_AMBULATORY_CARE_PROVIDER_SITE_OTHER): Payer: Medicare Other | Admitting: *Deleted

## 2015-01-10 DIAGNOSIS — I442 Atrioventricular block, complete: Secondary | ICD-10-CM | POA: Diagnosis not present

## 2015-01-10 NOTE — Telephone Encounter (Signed)
Informed patient that remote could be sent today. Patient voiced understanding and stated that she would send right now.

## 2015-01-10 NOTE — Telephone Encounter (Signed)
New message     Pt received letter about transmission not being received Pt wants to know if they can send transmission today

## 2015-01-11 NOTE — Progress Notes (Signed)
Remote pacemaker transmission.   

## 2015-01-25 ENCOUNTER — Telehealth: Payer: Self-pay | Admitting: Cardiovascular Disease

## 2015-01-25 NOTE — Telephone Encounter (Signed)
  Kimberly [name], Kimberly [name] Female, 81 y.o., [birth year] Last Weight:  159 lb (72.122 kg) Phone:  [phone] PCP:  [facility] [place] Language:  English Need Interp:  No AllergiesPenicillins Sulfa Antibiotics Health Maintenance:  Due FYINone Primary Ins:  [id number] MRN:  [id number] MyChart:  Pending Next Appt:  None Kimberly [name], Kimberly [name] - 01/25/2015 2:23 PM >','<< Less Detail',event)" href="javascript:;">More Detail >>   Kimberly [name]   Sent: Thu January 25, 2015 2:26 PM    To: Kimberly [name] Div Nl Clinical Pool        [facility] Size     Small Medium Large Extra Extra Large    Kimberly [name]  01/25/2015  Telephone  MRN:  [id number]   Description: 81 year old female  Provider: Sanda Klein, MD  Department: [facility]          Call [place] at 01/25/2015 2:23 PM     Status: Signed       Expand All Collapse All   Kimberly [name] called in stating that she has contacted our office a couple of times to get some information for the pt in regards to them being apart of the Heart failure program. She says that she needs her mot recent ejection fraction, BP and heart rate and a diagnosis code of heart failure. You can f/u with her to fax the information to [phone].   Thanks              Encounter MyChart Messages     No messages in this encounter     Routing History     Priority Sent On From To Message Type     01/25/2015 2:26 PM Kimberly [name] Cv Div Nl Clinical Pool Patient Calls      Created by                   Contacts       Type Contact    01/25/2015 02:23 PM Phone (Incoming) [place] (Other)    Phone: [phone]    ext [phone]      Following information left on a confidential VM of Kimberly [name] at [facility] Heart Failure Program: ICD 10 code of 10cm 150.43, BP 144/75 P 70 and EF of 60-65 %

## 2015-01-25 NOTE — Telephone Encounter (Signed)
Kimberly [name] called in stating that she has contacted our office a couple of times to get some information for the pt in regards to them being apart of the Heart failure program. She says that she needs her mot recent ejection fraction, BP and heart rate and a diagnosis code of heart failure. You can f/u with her to fax the information to [phone].   Thanks

## 2015-01-27 LAB — CUP PACEART REMOTE DEVICE CHECK
Battery Remaining Longevity: 50 mo
Brady Statistic AP VS Percent: 0 %
Brady Statistic AS VP Percent: 98.97 %
Brady Statistic RA Percent Paced: 0 %
Date Time Interrogation Session: 20160907212537
Lead Channel Impedance Value: 646 Ohm
Lead Channel Impedance Value: 874 Ohm
Lead Channel Pacing Threshold Amplitude: 0.625 V
Lead Channel Pacing Threshold Pulse Width: 0.4 ms
Lead Channel Pacing Threshold Pulse Width: 1 ms
Lead Channel Sensing Intrinsic Amplitude: 11.625 mV
Lead Channel Sensing Intrinsic Amplitude: 11.625 mV
Lead Channel Setting Pacing Amplitude: 2 V
Lead Channel Setting Pacing Pulse Width: 0.4 ms
Lead Channel Setting Sensing Sensitivity: 2.8 mV
MDC IDC MSMT BATTERY VOLTAGE: 2.99 V
MDC IDC MSMT LEADCHNL LV IMPEDANCE VALUE: 494 Ohm
MDC IDC MSMT LEADCHNL LV IMPEDANCE VALUE: 532 Ohm
MDC IDC MSMT LEADCHNL LV IMPEDANCE VALUE: 627 Ohm
MDC IDC MSMT LEADCHNL LV IMPEDANCE VALUE: 665 Ohm
MDC IDC MSMT LEADCHNL LV PACING THRESHOLD AMPLITUDE: 1 V
MDC IDC MSMT LEADCHNL RA IMPEDANCE VALUE: 4047 Ohm
MDC IDC MSMT LEADCHNL RA IMPEDANCE VALUE: 4047 Ohm
MDC IDC MSMT LEADCHNL RV IMPEDANCE VALUE: 627 Ohm
MDC IDC SET LEADCHNL LV PACING AMPLITUDE: 2.25 V
MDC IDC SET LEADCHNL LV PACING PULSEWIDTH: 1 ms
MDC IDC SET ZONE DETECTION INTERVAL: 150 ms
MDC IDC STAT BRADY AP VP PERCENT: 0 %
MDC IDC STAT BRADY AS VS PERCENT: 1.03 %
MDC IDC STAT BRADY RV PERCENT PACED: 98.97 %
Zone Setting Detection Interval: 400 ms

## 2015-01-29 ENCOUNTER — Telehealth: Payer: Self-pay | Admitting: Cardiovascular Disease

## 2015-01-29 NOTE — Telephone Encounter (Signed)
Left message for [facility] nurse to return call.

## 2015-01-30 ENCOUNTER — Telehealth: Payer: Self-pay

## 2015-01-30 NOTE — Telephone Encounter (Signed)
Following information left on a confidential VM of [place] at [place] Failure Program: ICD 10 code of 10cm 150.43, BP 144/75 P 70 and EF of 60-65 %   Left for [facility] CHF RN on confidential voice mail

## 2015-01-31 ENCOUNTER — Encounter: Payer: Self-pay | Admitting: *Deleted

## 2015-02-05 MED ORDER — FUROSEMIDE 20 MG PO TABS: 40.0000 mg | ORAL_TABLET | Freq: Every day | ORAL | Status: DC

## 2015-02-05 NOTE — Telephone Encounter (Signed)
Spoke with pt, Follow up scheduled with dr croitoru. Refill sent to the pharmacy.

## 2015-02-05 NOTE — Telephone Encounter (Signed)
°  1. Which medications need to be refilled? Furosemide 20mg    2. Which pharmacy is medication to be sent to?[facility]  3. Do they need a 30 day or 90 day supply? 90  4. Would they like a call back once the medication has been sent to the pharmacy? Yes

## 2015-02-07 ENCOUNTER — Encounter: Payer: Self-pay | Admitting: Cardiology

## 2015-02-21 ENCOUNTER — Encounter: Payer: Self-pay | Admitting: Cardiovascular Disease

## 2015-03-20 ENCOUNTER — Telehealth: Payer: Self-pay | Admitting: Cardiology

## 2015-03-20 NOTE — Telephone Encounter (Signed)
LMOVM reminding pt to send remote transmission.   

## 2015-03-21 ENCOUNTER — Telehealth: Payer: Self-pay | Admitting: Family Medicine

## 2015-03-21 ENCOUNTER — Ambulatory Visit (INDEPENDENT_AMBULATORY_CARE_PROVIDER_SITE_OTHER): Payer: Medicare Other | Admitting: Cardiovascular Disease

## 2015-03-21 ENCOUNTER — Encounter: Payer: Self-pay | Admitting: Cardiovascular Disease

## 2015-03-21 VITALS — BP 136/72 | HR 72 | Resp 16 | Ht 65.0 in | Wt 150.2 lb

## 2015-03-21 DIAGNOSIS — N183 Chronic kidney disease, stage 3 unspecified: Secondary | ICD-10-CM

## 2015-03-21 DIAGNOSIS — Z8659 Personal history of other mental and behavioral disorders: Secondary | ICD-10-CM

## 2015-03-21 DIAGNOSIS — I4821 Permanent atrial fibrillation: Secondary | ICD-10-CM

## 2015-03-21 DIAGNOSIS — I442 Atrioventricular block, complete: Secondary | ICD-10-CM

## 2015-03-21 DIAGNOSIS — Z7901 Long term (current) use of anticoagulants: Secondary | ICD-10-CM

## 2015-03-21 DIAGNOSIS — I429 Cardiomyopathy, unspecified: Secondary | ICD-10-CM | POA: Diagnosis not present

## 2015-03-21 DIAGNOSIS — I482 Chronic atrial fibrillation: Secondary | ICD-10-CM | POA: Diagnosis not present

## 2015-03-21 DIAGNOSIS — I428 Other cardiomyopathies: Secondary | ICD-10-CM

## 2015-03-21 DIAGNOSIS — Z950 Presence of cardiac pacemaker: Secondary | ICD-10-CM

## 2015-03-21 MED ORDER — FUROSEMIDE 20 MG PO TABS: 20.0000 mg | ORAL_TABLET | Freq: Every day | ORAL | Status: DC

## 2015-03-21 NOTE — Progress Notes (Signed)
Patient ID: Kimberly [name], female   DOB: 03/03/1927, 81 y.o.   MRN: [id number]     Cardiology Office Note   Date:  03/21/2015   ID:  [name], DOB [birth year], MRN [id number]  PCP:  Wardell Honour, MD  Cardiologist:   Sanda Klein, MD   Chief Complaint  Patient presents with  . Follow-up    no chest pain, shortness of breath-all the time, occassional edema, occassional pain in legs, occassional cramping in legs, occassional lightheadedness, occassional dizziness      History of Present Illness: Kimberly [name] is a 81 y.o. female who presents for  Follow-up of permanent atrial fibrillation, status post AV node ablation with complete heart block, status post CRT-P  And diastolic heart failure.   Since her last appointment she has done quite well. She has not had episodes of edema or significant change in her chronic shortness of breath. She walks carefully with a walker and has not fallen. She continues to live independently.  Her last pacemaker download from roughly 6 weeks ago showed 100% biventricular pacing (0.7% VSRP),  Estimated generator longevity 4 years,  Normal lead parameters, stable thoracic impedance recently , no episodes of high ventricular rate.   her most recent echocardiogram performed July 2016 showed left ventricular ejection fraction 60-65 percent , severe biatrial dilatation, minor valvular abnormalities, peak PA systolic pressure 39 mmHg  Kimberly [name] has a long-standing history of permanent atrial fibrillation s/p AV node ablation, complete heart block and nonischemic cardiomyopathy with an excellent response to cardiac resynchronization therapy. She originally had a CRT-D device but at the time of generator change out was "downgraded" to a CRT-P. since her left ventricular ejection fraction increased to 50-55%. Her current device is a Risk analyst CRT-P [id number] implanted in June 2012. Her defibrillator lead, which was a Sprint fidelis lead is capped and an  older RV pacemaker lead Medtronic [id number] implanted 1997 is being used. The left ventricular lead is a Medtronic [id number] implanted in 2007. Her most recent echocardiogram was performed in 2012 and showed left ventricular ejection fraction of 50-55%, biatrial dilatation, moderate mitral regurgitation, moderate tricuspid regurgitation. She has long-standing problems with chronic scoliosis and fibromyalgia.  In December 2015 she was hospitalized in [place] and received 2 units of blood transfusion. No bleeding events since that time. Hospitalized in January 2016 after a fall at home,  Possibly precipitated by dehydration complicated by acute on chronic renal failure.  Past Medical History  Diagnosis Date  . Depression   . Atrial fibrillation ([place])   . Scoliosis   . Osteoarthritis   . Pacemaker     Last saw cards 07/2013  . Fibromyalgia   . Hypertension   . Glaucoma   . Acute diverticulitis 08/24/2013  . CKD (chronic kidney disease) stage 3, GFR 30-59 ml/min 10/12/2012  . Chronic anticoagulation 10/12/2012  . Acute blood loss anemia 10/11/2012  . Antral ulcer 10/11/2012  . Erosive esophagitis 10/11/2012  . Cardiomyopathy, nonischemic ([place])   . Acute on chronic combined systolic and diastolic CHF, NYHA class 4 ([place]) 11/15/2013  . Arrhythmia     atrial fibb  . H/O echocardiogram 2007    EF 40-45%,           Past Surgical History  Procedure Laterality Date  . Pacemaker insertion    . Abdominal hysterectomy    . Cholecystectomy    . Appendectomy    . Breast surgery    . Back surgery    .  Neck surgery    . Hernia repair      right inguinal hernia and umbilical  . Tonsillectomy    . Esophagogastroduodenoscopy N/A 10/13/2012    Dr. Gala Romney: severe ulcerative reflux esophagitis, question of Barrett's but negative path, single deep prepyloric antral ulcer, negative H.pylori  . Cystoscopy N/A 02/24/2013    Procedure: CYSTOSCOPY WITH URETHRAL DILITATION;  Surgeon: Marissa Nestle, MD;  Location: [facility];   Service: Urology;  Laterality: N/A;  . Doppler echocardiography N/A 05/30/2010    LV SIZE IS NORMAL. LV SYSTOLIC FUNCTION IS LOW NORMAL. EF=50-55%. MILD INFERIOR HYPOKINESIS.MILD TO MODERATE POSTERIOR WALL HYPOKINESIS.PACEMAKER LEAD IN THE RV. LA IS MILDLY DILATED. RA IS MODERATE TO SEVERLY DILATED. PACEMAKER LEAD IN THE RA. MILD CALCICICATION OF THE MV APPARATUS. MODERATE MR. MILD TO MODERATE TR. MILD PHTN.AV MILDLY SCLEROTIC.  . Nuclear stress test N/A 02/13/2009    NORMAL PATTERN OF PERFUSION IN ALL REGIONS. POST STRESS VENTICULAR SIZE IS NORMAL. POST  STESS EF 85%.  NORMAL MYOCARDIAL PERFUSION STUDY.  . Cardiac catheterization  12/08/2005    LAD AND LEFT MAIN WITH NO HIGH-GRADE STENOSIS. MILD DISEASE IN THE CX AND LAD SYSTEM. SEVERE LV DYSFUNCTION WITH DILATION OF THE LV. EF 15-20%. LV END-DIASTOLIC PRESSURE IS 90. +1 MR.  Fawn Kirk ext venous Bilateral 11-08-10    R & L- NO EVIDENCE OF THROMBUS OR THROMBOPHLEBITIS. THERE IS MILD AMOUNT OF SUBCUTANEOUS EDEMA NOTED WITHIN THE LEFT CALF AND ANKLE. R & L GSV AND SSV- NO VENOUS INSUFF NOTED.  . Yag laser application Bilateral [id number]    Procedure: YAG LASER APPLICATION;  Surgeon: Williams Che, MD;  Location: [facility];  Service: Ophthalmology;  Laterality: Bilateral;     Current Outpatient Prescriptions  Medication Sig Dispense Refill  . apixaban (ELIQUIS) 2.5 MG TABS tablet Take 1 tablet by mouth  twice a day (Patient taking differently: Take 2.5 mg by mouth 2 (two) times daily. ) 180 tablet 0  . carvedilol (COREG) 6.25 MG tablet Take 1 tablet (6.25 mg total) by mouth 2 (two) times daily with a meal. Resume on 7/29 180 tablet 3  . furosemide (LASIX) 20 MG tablet Take 1 tablet (20 mg total) by mouth daily. Resume on 7/31 90 tablet 3   No current facility-administered medications for this visit.    Allergies:   Penicillins and Sulfa antibiotics    Social History:  The patient  reports that she quit smoking about 10 years ago. Her smoking use  included Cigarettes. She has a 15 pack-year smoking history. She does not have any smokeless tobacco history on file. She reports that she drinks alcohol. She reports that she does not use illicit drugs.   Family History:  The patient's family history includes Asthma in her sister; Cancer in her sister; Heart failure in her brother. There is no history of Colon cancer.    ROS:  Please see the history of present illness.    Otherwise, review of systems positive for none.   All other systems are reviewed and negative.    PHYSICAL EXAM: VS:  BP 136/72 mmHg  Pulse 72  Resp 16  Ht 5\' 5"  (1.651 m)  Wt 150 lb 3 oz (68.125 kg)  BMI 24.99 kg/m2 , BMI Body mass index is 24.99 kg/(m^2).  General: Alert, oriented x3, no distress Head: no evidence of trauma, PERRL, EOMI, no exophtalmos or lid lag, no myxedema, no xanthelasma; normal ears, nose and oropharynx Neck: normal jugular venous pulsations and no hepatojugular  reflux; brisk carotid pulses without delay and no carotid bruits Chest: clear to auscultation, no signs of consolidation by percussion or palpation, normal fremitus, symmetrical and full respiratory excursions,  Healthy left subclavian pacemaker site Cardiovascular: normal position and quality of the apical impulse, regular rhythm, normal first and paradoxically split second heart sounds, no  murmurs, rubs or gallops Abdomen: no tenderness or distention, no masses by palpation, no abnormal pulsatility or arterial bruits, normal bowel sounds, no hepatosplenomegaly Extremities: no clubbing, cyanosis or edema; 2+ radial, ulnar and brachial pulses bilaterally; 2+ right femoral, posterior tibial and dorsalis pedis pulses; 2+ left femoral, posterior tibial and dorsalis pedis pulses; no subclavian or femoral bruits Neurological: grossly nonfocal Psych: euthymic mood, full affect   EKG:  EKG is not ordered today.   Recent Labs: 11/28/2014: ALT 11*; Magnesium 1.9; TSH 3.392 12/04/2014: B  Natriuretic Peptide 1480.0*; BUN 23*; Creatinine, Ser 1.27*; Hemoglobin 8.5*; Platelets 393; Potassium 4.2; Sodium 137    Lipid Panel    Component Value Date/Time   CHOL  02/06/2009 0450    181        ATP III CLASSIFICATION:  <200     mg/dL   Desirable  200-239  mg/dL   Borderline High  >=240    mg/dL   High          TRIG 81 02/06/2009 0450   HDL 78 02/06/2009 0450   CHOLHDL 2.3 02/06/2009 0450   VLDL 16 02/06/2009 0450   LDLCALC  02/06/2009 0450    87        Total Cholesterol/HDL:CHD Risk Coronary Heart Disease Risk Table                     Men   Women  1/2 Average Risk   3.4   3.3  Average Risk       5.0   4.4  2 X Average Risk   9.6   7.1  3 X Average Risk  23.4   11.0        Use the calculated Patient Ratio above and the CHD Risk Table to determine the patient's CHD Risk.        ATP III CLASSIFICATION (LDL):  <100     mg/dL   Optimal  100-129  mg/dL   Near or Above                    Optimal  130-159  mg/dL   Borderline  160-189  mg/dL   High  >190     mg/dL   Very High      Wt Readings from Last 3 Encounters:  03/21/15 150 lb 3 oz (68.125 kg)  12/04/14 159 lb (72.122 kg)  11/29/14 159 lb 2.8 oz (72.2 kg)    .   ASSESSMENT AND PLAN:  1.  Permanent atrial fibrillation on appropriate anticoagulation therapy. No recent bleeding complications. 2.  Complete heart block after AV node ablation, pacemaker dependent 3.  Normal function of biventricular pacemaker.  Continue every 3 month remote [facility] downloads and yearly office visits 4.  History of congestive heart failure currently euvolemic, on loop diuretic therapy. Complete normalization of left ventricular systolic function.     Current medicines are reviewed at length with the patient today.  The patient does not have concerns regarding medicines.  The following changes have been made:   Reduce furosemide to 20 mg daily  Labs/ tests ordered today include:  No orders of the defined  types were placed  in this encounter.     Patient Instructions  Your physician has recommended you make the following change in your medication:  DECREASE FUROSEMIDE TO ONE TABLET DAILY (20 MG).  Your physician recommends that you weigh, daily, at the same time every day, and in the same amount of clothing. Please record your daily weights on the handout provided and bring it to your next appointment.  IF YOU GAIN 3 LBS IN A DAY OR 5 LBS IN A WEEK PLEASE CALL OUR OFFICE.  Remote monitoring is used to monitor your Pacemaker or ICD from home. This monitoring reduces the number of office visits required to check your device to one time per year. It allows [place] to monitor the functioning of your device to ensure it is working properly. You are scheduled for a device check from home on June 22, 2015. You may send your transmission at any time that day. If you have a wireless device, the transmission will be sent automatically. After your physician reviews your transmission, you will receive a postcard with your next transmission date.  Dr. Sallyanne Kuster recommends that you schedule a follow-up appointment in: ONE YEAR.             Mikael Spray, MD  03/21/2015 2:46 PM    Sanda Klein, MD, [facility] [phone] office [phone] pager

## 2015-03-21 NOTE — Patient Instructions (Addendum)
Your physician has recommended you make the following change in your medication:  DECREASE FUROSEMIDE TO ONE TABLET DAILY (20 MG).  Your physician recommends that you weigh, daily, at the same time every day, and in the same amount of clothing. Please record your daily weights on the handout provided and bring it to your next appointment.  IF YOU GAIN 3 LBS IN A DAY OR 5 LBS IN A WEEK PLEASE CALL OUR OFFICE.  Remote monitoring is used to monitor your Pacemaker or ICD from home. This monitoring reduces the number of office visits required to check your device to one time per year. It allows [place] to monitor the functioning of your device to ensure it is working properly. You are scheduled for a device check from home on April 21, 2015. You may send your transmission at any time that day. If you have a wireless device, the transmission will be sent automatically. After your physician reviews your transmission, you will receive a postcard with your next transmission date.  Dr. Sallyanne Kuster recommends that you schedule a follow-up appointment in: ONE YEAR.

## 2015-03-23 NOTE — Progress Notes (Signed)
No ICM transmission received after reminder call.  Scheduled new remote transmission date for 04/11/2015.  Patient letter sent with new transmission date.

## 2015-04-03 ENCOUNTER — Other Ambulatory Visit: Payer: Self-pay | Admitting: *Deleted

## 2015-04-03 MED ORDER — APIXABAN 2.5 MG PO TABS: ORAL_TABLET | ORAL | Status: DC

## 2015-04-11 ENCOUNTER — Telehealth: Payer: Self-pay | Admitting: Cardiology

## 2015-04-11 ENCOUNTER — Ambulatory Visit (INDEPENDENT_AMBULATORY_CARE_PROVIDER_SITE_OTHER): Payer: Medicare Other

## 2015-04-11 DIAGNOSIS — I5043 Acute on chronic combined systolic (congestive) and diastolic (congestive) heart failure: Secondary | ICD-10-CM

## 2015-04-11 DIAGNOSIS — Z950 Presence of cardiac pacemaker: Secondary | ICD-10-CM

## 2015-04-11 NOTE — Telephone Encounter (Signed)
Spoke with pt and reminded pt of remote transmission that is due today. Pt verbalized understanding.   

## 2015-04-13 NOTE — Progress Notes (Signed)
EPIC Encounter for ICM Monitoring  Patient Name: Kimberly [name] is a 81 y.o. female Date: 04/13/2015 Primary Care Physican: Wardell Honour, MD Primary Cardiologist: Croitoru Electrophysiologist: Croitoru Dry Weight: 140 lb   Bi-V Pacing 98.8%      In the past month, have you:  1. Gained more than 2 pounds in a day or more than 5 pounds in a week? no  2. Had changes in your medications (with verification of current medications)? Yes, Furosemide decreased to 20 mg daily by Dr Sallyanne Kuster on 03/21/2015  3. Had more shortness of breath than is usual for you? no  4. Limited your activity because of shortness of breath? no  5. Not been able to sleep because of shortness of breath? no  6. Had increased swelling in your feet or ankles? no  7. Had symptoms of dehydration (dizziness, dry mouth, increased thirst, decreased urine output) no  8. Had changes in sodium restriction? no  9. Been compliant with medication? Yes   ICM trend: 04/11/2015     Follow-up plan: ICM clinic phone appointment on 04/19/2015.  Optivol thoracic impedance below baseline ~ 02/24/2015 to 03/11/2015 and ~03/19/2015 to 04/11/2015 suggesting fluid retention.   Patient denied any HF symptoms.  Asked if she follows low sodium diet and she stated yes.  She stated she does not drink more than 64 oz fluid daily.  She stated she may have eaten foods higher in sodium around Thanksgiving holiday.    Impedance starting to trend toward baseline on 04/06/2015.  Advised would send the transmission from 04/11/2015 for Dr Sallyanne Kuster to review and will call back with any recommendations.   Lab results from 12/04/2014:  BUN = 23, Creatinine 1.27; Potassium 4.2.  Historically Creatinine has ranged 1.24 to 1.72 since 05/29/2014.    Copy of note sent to patient's primary care physician, primary cardiologist, and device following physician.  Rosalene Billings, RN, CCM 04/13/2015 10:56 AM   Sanda Klein, MD   Sent: Fri April 13, 2015  11:49 AM    To: Rosalene Billings, RN        Message     Her fluid situation has always been rather erratic. It seems that the thoracic impedance is quickly returning to baseline. Probably a holiday related sodium excess. Would not change her medications.    Patient notified that Dr Sallyanne Kuster did not make any medication adjustments.  Advised to follow low sodium diet and will recheck ICM transmission on 04/19/2015.

## 2015-04-19 ENCOUNTER — Ambulatory Visit (INDEPENDENT_AMBULATORY_CARE_PROVIDER_SITE_OTHER): Payer: Medicare Other

## 2015-04-19 DIAGNOSIS — I5043 Acute on chronic combined systolic (congestive) and diastolic (congestive) heart failure: Secondary | ICD-10-CM

## 2015-04-19 DIAGNOSIS — Z950 Presence of cardiac pacemaker: Secondary | ICD-10-CM

## 2015-04-20 NOTE — Progress Notes (Signed)
EPIC Encounter for ICM Monitoring  Patient Name: Kimberly [name] is a 81 y.o. female Date: 04/20/2015 Primary Care Physican: Wardell Honour, MD Primary Cardiologist: Croitoru Electrophysiologist: Croitoru Dry Weight: 140 lb  Bi-V Pacing 98.9%       In the past month, have you:  1. Gained more than 2 pounds in a day or more than 5 pounds in a week? no  2. Had changes in your medications (with verification of current medications)? no  3. Had more shortness of breath than is usual for you? no  4. Limited your activity because of shortness of breath? no  5. Not been able to sleep because of shortness of breath? no  6. Had increased swelling in your feet or ankles? no  7. Had symptoms of dehydration (dizziness, dry mouth, increased thirst, decreased urine output) no  8. Had changes in sodium restriction? no  9. Been compliant with medication? Yes   ICM trend: 04/19/2015   Follow-up plan: ICM clinic phone appointment on 05/22/2015.  Optivol thoracic impedance improved since last transmission on 04/09/2015 and has returned close to baseline.  She denied any symptoms today.  She stated she feels fine.  Education given to continue low sodium diet.  No changes today.     Copy of note sent to patient's primary care physician, primary cardiologist, and device following physician.  Rosalene Billings, RN, CCM 04/20/2015 10:55 AM

## 2015-04-26 NOTE — Progress Notes (Signed)
Sanda Klein, MD   Sent: Fri April 20, 2015 11:09 AM    To: Rosalene Billings, RN        Message     Better, thanks.

## 2015-05-04 ENCOUNTER — Telehealth: Payer: Self-pay | Admitting: *Deleted

## 2015-05-04 NOTE — Telephone Encounter (Signed)
There is no flu shot documentation or declination. Left message on voicemail for patient to call back with this information.

## 2015-05-18 NOTE — Telephone Encounter (Signed)
No response after 2 phone calls to patient. Will postpone flu shot for now.

## 2015-05-22 ENCOUNTER — Telehealth: Payer: Self-pay | Admitting: Cardiology

## 2015-05-22 ENCOUNTER — Ambulatory Visit (INDEPENDENT_AMBULATORY_CARE_PROVIDER_SITE_OTHER): Payer: Medicare Other

## 2015-05-22 DIAGNOSIS — I5043 Acute on chronic combined systolic (congestive) and diastolic (congestive) heart failure: Secondary | ICD-10-CM

## 2015-05-22 DIAGNOSIS — Z950 Presence of cardiac pacemaker: Secondary | ICD-10-CM | POA: Diagnosis not present

## 2015-05-22 NOTE — Telephone Encounter (Signed)
Spoke with pt and reminded pt of remote transmission that is due today. Pt verbalized understanding.   

## 2015-05-23 NOTE — Progress Notes (Signed)
EPIC Encounter for ICM Monitoring  Patient Name: Kimberly [name] is a 82 y.o. female Date: 05/23/2015 Primary Care Physican: Wardell Honour, MD Primary Cardiologist: Croitoru Electrophysiologist: Croitoru Dry Weight: 140 lbs  Bi-V Pacing 99.2%       In the past month, have you:  1. Gained more than 2 pounds in a day or more than 5 pounds in a week? no  2. Had changes in your medications (with verification of current medications)? no  3. Had more shortness of breath than is usual for you? no  4. Limited your activity because of shortness of breath? no  5. Not been able to sleep because of shortness of breath? no  6. Had increased swelling in your feet or ankles? no  7. Had symptoms of dehydration (dizziness, dry mouth, increased thirst, decreased urine output) no  8. Had changes in sodium restriction? no  9. Been compliant with medication? Yes   ICM trend:3 month view 05/22/2015  ICM trend: 1 year view 05/22/2015   Follow-up plan: ICM clinic phone appointment on 06/07/2015.  Optivol thoracic impedance below reference line since ~04/17/2015 suggesting fluid retention.  She denied any fluid symptoms.  She reported weight is stable.  She stated she follows low salt diet as prescribed and staying with 64 oz for fluid intake.   12/04/2015 labs:  Creatinine 1.27, BUN 23, Potassium 4.2  Advised would send to Dr Sallyanne Kuster for review and recommendations.  Will repeat remote ICM transmission on 06/07/2015.  Copy of note sent to patient's primary care physician, primary cardiologist, and device following physician.  Rosalene Billings, RN, CCM 05/23/2015 1:39 PM  Sanda Klein, MD   Sent: Wed May 23, 2015 2:31 PM    To: Rosalene Billings, RN        Message     It looks like the impedance is heading back to normal, suspect optivol will normalize in a few days as weel. Recheck in 2 weeks, please.

## 2015-06-07 ENCOUNTER — Telehealth: Payer: Self-pay | Admitting: Cardiology

## 2015-06-07 ENCOUNTER — Ambulatory Visit (INDEPENDENT_AMBULATORY_CARE_PROVIDER_SITE_OTHER): Payer: Medicare Other

## 2015-06-07 DIAGNOSIS — Z950 Presence of cardiac pacemaker: Secondary | ICD-10-CM

## 2015-06-07 DIAGNOSIS — I5043 Acute on chronic combined systolic (congestive) and diastolic (congestive) heart failure: Secondary | ICD-10-CM

## 2015-06-07 NOTE — Progress Notes (Signed)
Thank you. She seems to have very broad Optivol swings without good clinical correlation. Current deviation is improving. No changes

## 2015-06-07 NOTE — Progress Notes (Signed)
EPIC Encounter for ICM Monitoring  Patient Name: Kimberly [name] is a 82 y.o. female Date: 06/07/2015 Primary Care Physican: Wardell Honour, MD Primary Cardiologist: Croitoru Electrophysiologist: Croitoru Dry Weight: 140 lbs  Bi-V Pacing 98.8%       In the past month, have you:  1. Gained more than 2 pounds in a day or more than 5 pounds in a week? no  2. Had changes in your medications (with verification of current medications)? no  3. Had more shortness of breath than is usual for you? no  4. Limited your activity because of shortness of breath? no  5. Not been able to sleep because of shortness of breath? no  6. Had increased swelling in your feet or ankles? Yes, feet and ankles which improves overnight.    7. Had symptoms of dehydration (dizziness, dry mouth, increased thirst, decreased urine output) no  8. Had changes in sodium restriction? no  9. Been compliant with medication? Yes   ICM trend: 3 month view for 06/07/2015   ICM trend: 1 year view for 06/07/2015   Follow-up plan: ICM clinic phone appointment 06/25/2015.  Optivol thoracic impedance below reference line since last ICM transmission on 05/22/2015 suggesting fluid retention.  Impedance slight improvement on 05/30/2015 and 06/04/2015.  She stated she eats heart healthy foods when possible.  She reported she feels fine.  Education given on limiting fluid intake to 64 oz day and continue with low salt diet.    Advised will send to Dr Sallyanne Kuster for review and will call back if any recommendations.  Will repeat transmission on 06/25/2015.    Copy of note sent to patient's primary care physician, primary cardiologist, and device following physician.  Rosalene Billings, RN, CCM 06/07/2015 12:12 PM

## 2015-06-07 NOTE — Telephone Encounter (Signed)
Spoke with pt and reminded pt of remote transmission that is due today. Pt verbalized understanding.   

## 2015-06-16 ENCOUNTER — Other Ambulatory Visit: Payer: Self-pay | Admitting: Cardiovascular Disease

## 2015-06-18 NOTE — Telephone Encounter (Signed)
Rx request sent to pharmacy.  

## 2015-06-25 ENCOUNTER — Ambulatory Visit (INDEPENDENT_AMBULATORY_CARE_PROVIDER_SITE_OTHER): Payer: Medicare Other

## 2015-06-25 DIAGNOSIS — I5043 Acute on chronic combined systolic (congestive) and diastolic (congestive) heart failure: Secondary | ICD-10-CM | POA: Diagnosis not present

## 2015-06-25 DIAGNOSIS — Z950 Presence of cardiac pacemaker: Secondary | ICD-10-CM

## 2015-06-26 MED ORDER — FUROSEMIDE 20 MG PO TABS: 20.0000 mg | ORAL_TABLET | Freq: Every day | ORAL | Status: DC

## 2015-06-26 NOTE — Progress Notes (Addendum)
EPIC Encounter for ICM Monitoring  Patient Name: Kimberly [name] is a 82 y.o. female Date: 06/26/2015 Primary Care Physican: Wardell Honour, MD Primary Cardiologist: Croitoru Electrophysiologist: Croitoru Dry Weight: 140 lbs  Bi-V Pacing 99%        In the past month, have you:  1. Gained more than 2 pounds in a day or more than 5 pounds in a week? no  2. Had changes in your medications (with verification of current medications)? no  3. Had more shortness of breath than is usual for you? no  4. Limited your activity because of shortness of breath? no  5. Not been able to sleep because of shortness of breath? no  6. Had increased swelling in your feet or ankles? no  7. Had symptoms of dehydration (dizziness, dry mouth, increased thirst, decreased urine output) no  8. Had changes in sodium restriction? no  9. Been compliant with medication? Yes   ICM trend: 3 month view for 06/25/2015    ICM trend: 1 year view for 06/25/2015   Follow-up plan: ICM clinic phone appointment on 07/27/2015.  Optivol thoracic impedance returned to reference line on 06/18/2015 suggesting fluid levels are stable.  She denied any fluid symptoms and stated she is doing well at this time.  Encouraged her to call for fluid symptoms.  No changes today.  Patient requested Furosemide refill, confirmed dosage and pharmacy.   Copy of note sent to patient's primary care physician, primary cardiologist, and device following physician.  Rosalene Billings, RN, CCM 06/26/2015 9:52 AM   Sanda Klein, MD   Sent: Tue June 26, 2015 1:00 PM    To: Rosalene Billings, RN        Message     Thank you!

## 2015-06-26 NOTE — Addendum Note (Signed)
Addended by: Rosalene Billings on: 06/26/2015 10:09 AM   Modules accepted: Orders

## 2015-07-01 ENCOUNTER — Emergency Department (HOSPITAL_COMMUNITY)
Admission: EM | Admit: 2015-07-01 | Discharge: 2015-07-01 | Disposition: A | Discharge status: Home / Self Care | Payer: Medicare Other | Attending: Emergency Medicine | Admitting: Emergency Medicine

## 2015-07-01 ENCOUNTER — Emergency Department (HOSPITAL_COMMUNITY): Payer: Medicare Other

## 2015-07-01 ENCOUNTER — Encounter (HOSPITAL_COMMUNITY): Payer: Self-pay | Admitting: *Deleted

## 2015-07-01 DIAGNOSIS — Z862 Personal history of diseases of the blood and blood-forming organs and certain disorders involving the immune mechanism: Secondary | ICD-10-CM | POA: Insufficient documentation

## 2015-07-01 DIAGNOSIS — J069 Acute upper respiratory infection, unspecified: Secondary | ICD-10-CM | POA: Diagnosis not present

## 2015-07-01 DIAGNOSIS — Z880 Allergy status to penicillin: Secondary | ICD-10-CM | POA: Diagnosis not present

## 2015-07-01 DIAGNOSIS — M1990 Unspecified osteoarthritis, unspecified site: Secondary | ICD-10-CM | POA: Insufficient documentation

## 2015-07-01 DIAGNOSIS — Z87891 Personal history of nicotine dependence: Secondary | ICD-10-CM | POA: Insufficient documentation

## 2015-07-01 DIAGNOSIS — I129 Hypertensive chronic kidney disease with stage 1 through stage 4 chronic kidney disease, or unspecified chronic kidney disease: Secondary | ICD-10-CM | POA: Diagnosis not present

## 2015-07-01 DIAGNOSIS — Z7901 Long term (current) use of anticoagulants: Secondary | ICD-10-CM | POA: Insufficient documentation

## 2015-07-01 DIAGNOSIS — I4891 Unspecified atrial fibrillation: Secondary | ICD-10-CM | POA: Insufficient documentation

## 2015-07-01 DIAGNOSIS — N183 Chronic kidney disease, stage 3 (moderate): Secondary | ICD-10-CM | POA: Diagnosis not present

## 2015-07-01 DIAGNOSIS — I5043 Acute on chronic combined systolic (congestive) and diastolic (congestive) heart failure: Secondary | ICD-10-CM | POA: Insufficient documentation

## 2015-07-01 DIAGNOSIS — H409 Unspecified glaucoma: Secondary | ICD-10-CM | POA: Diagnosis not present

## 2015-07-01 DIAGNOSIS — Z8719 Personal history of other diseases of the digestive system: Secondary | ICD-10-CM | POA: Insufficient documentation

## 2015-07-01 DIAGNOSIS — R05 Cough: Secondary | ICD-10-CM | POA: Diagnosis not present

## 2015-07-01 DIAGNOSIS — Z8659 Personal history of other mental and behavioral disorders: Secondary | ICD-10-CM | POA: Insufficient documentation

## 2015-07-01 MED ORDER — PROMETHAZINE-CODEINE 6.25-10 MG/5ML PO SYRP: 5.0000 mL | ORAL_SOLUTION | Freq: Four times a day (QID) | ORAL | Status: DC | PRN

## 2015-07-01 MED ORDER — LEVOFLOXACIN 500 MG PO TABS: 500.0000 mg | ORAL_TABLET | Freq: Every day | ORAL | Status: DC

## 2015-07-01 MED ORDER — ALBUTEROL SULFATE HFA 108 (90 BASE) MCG/ACT IN AERS
2.0000 | INHALATION_SPRAY | Freq: Once | RESPIRATORY_TRACT | Status: AC
  Administered 2015-07-01: 2 via RESPIRATORY_TRACT
  Filled 2015-07-01: qty 6.7

## 2015-07-01 MED ORDER — HYDROCOD POLST-CPM POLST ER 10-8 MG/5ML PO SUER
3.0000 mL | Freq: Once | ORAL | Status: AC
  Administered 2015-07-01: 3 mL via ORAL
  Filled 2015-07-01: qty 5

## 2015-07-01 MED ORDER — LEVOFLOXACIN 500 MG PO TABS
500.0000 mg | ORAL_TABLET | Freq: Once | ORAL | Status: AC
  Administered 2015-07-01: 500 mg via ORAL
  Filled 2015-07-01: qty 1

## 2015-07-01 MED ORDER — IPRATROPIUM-ALBUTEROL 0.5-2.5 (3) MG/3ML IN SOLN
3.0000 mL | Freq: Once | RESPIRATORY_TRACT | Status: AC
  Administered 2015-07-01: 3 mL via RESPIRATORY_TRACT
  Filled 2015-07-01: qty 3

## 2015-07-01 NOTE — ED Notes (Signed)
Productive cough, clear in color which began Friday. SOB with minimal exertion.

## 2015-07-01 NOTE — Discharge Instructions (Signed)
Chest x-ray showed no pneumonia. Prescription for antibiotic to start tomorrow. You have an inhaler. Also cough syrup. Increase fluids. Tylenol for fever.

## 2015-07-01 NOTE — ED Provider Notes (Signed)
CSN: [id number]     Arrival date & time 07/01/15  1120 History  By signing my name below, I, Kimberly [name], attest that this documentation has been prepared under the direction and in the presence of Nat Christen, MD. Electronically Signed: Jolayne [name], Scribe. 07/01/2015. 11:53 AM.   Chief Complaint  Patient presents with  . Cough   The history is provided by the patient and a relative. No language interpreter was used.   HPI Comments: Kimberly [name] is a 82 y.o. female who presents to the Emergency Department complaining of sudden onset of a productive cough with yellow sputum which began about two days ago. Pt notes that she has bronchitis and also reports associated, mild wheezing. Her daughter reports that she has generally been feeling unwell for the past two days but notes that she has been eating fairly normally. Severity of symptoms is mild to moderate. She is ambulatory  PCP: Su Ley.   Past Medical History  Diagnosis Date  . Depression   . Atrial fibrillation ([place])   . Scoliosis   . Osteoarthritis   . Pacemaker     Last saw cards 07/2013  . Fibromyalgia   . Hypertension   . Glaucoma   . Acute diverticulitis 08/24/2013  . CKD (chronic kidney disease) stage 3, GFR 30-59 ml/min 10/12/2012  . Chronic anticoagulation 10/12/2012  . Acute blood loss anemia 10/11/2012  . Antral ulcer 10/11/2012  . Erosive esophagitis 10/11/2012  . Cardiomyopathy, nonischemic ([place])   . Acute on chronic combined systolic and diastolic CHF, NYHA class 4 ([place]) 11/15/2013  . Arrhythmia     atrial fibb  . H/O echocardiogram 2007    EF 40-45%,          Past Surgical History  Procedure Laterality Date  . Pacemaker insertion    . Abdominal hysterectomy    . Cholecystectomy    . Appendectomy    . Breast surgery    . Back surgery    . Neck surgery    . Hernia repair      right inguinal hernia and umbilical  . Tonsillectomy    . Esophagogastroduodenoscopy N/A 10/13/2012    Dr.  Gala Romney: severe ulcerative reflux esophagitis, question of Barrett's but negative path, single deep prepyloric antral ulcer, negative H.pylori  . Cystoscopy N/A 02/24/2013    Procedure: CYSTOSCOPY WITH URETHRAL DILITATION;  Surgeon: Marissa Nestle, MD;  Location: [facility];  Service: Urology;  Laterality: N/A;  . Doppler echocardiography N/A 05/30/2010    LV SIZE IS NORMAL. LV SYSTOLIC FUNCTION IS LOW NORMAL. EF=50-55%. MILD INFERIOR HYPOKINESIS.MILD TO MODERATE POSTERIOR WALL HYPOKINESIS.PACEMAKER LEAD IN THE RV. LA IS MILDLY DILATED. RA IS MODERATE TO SEVERLY DILATED. PACEMAKER LEAD IN THE RA. MILD CALCICICATION OF THE MV APPARATUS. MODERATE MR. MILD TO MODERATE TR. MILD PHTN.AV MILDLY SCLEROTIC.  . Nuclear stress test N/A 02/13/2009    NORMAL PATTERN OF PERFUSION IN ALL REGIONS. POST STRESS VENTICULAR SIZE IS NORMAL. POST  STESS EF 85%.  NORMAL MYOCARDIAL PERFUSION STUDY.  . Cardiac catheterization  12/08/2005    LAD AND LEFT MAIN WITH NO HIGH-GRADE STENOSIS. MILD DISEASE IN THE CX AND LAD SYSTEM. SEVERE LV DYSFUNCTION WITH DILATION OF THE LV. EF 15-20%. LV END-DIASTOLIC PRESSURE IS 90. +1 MR.  Fawn Kirk ext venous Bilateral 11-08-10    R & L- NO EVIDENCE OF THROMBUS OR THROMBOPHLEBITIS. THERE IS MILD AMOUNT OF SUBCUTANEOUS EDEMA NOTED WITHIN THE LEFT CALF AND ANKLE. R & L GSV AND  SSV- NO VENOUS INSUFF NOTED.  . Yag laser application Bilateral [id number]    Procedure: YAG LASER APPLICATION;  Surgeon: Williams Che, MD;  Location: [facility];  Service: Ophthalmology;  Laterality: Bilateral;   Family History  Problem Relation Age of Onset  . Cancer Sister   . Asthma Sister   . Heart failure Brother   . Colon cancer Neg Hx    Social History  Substance Use Topics  . Smoking status: Former Smoker -- 0.50 packs/day for 30 years    Types: Cigarettes    Quit date: 12/13/2004  . Smokeless tobacco: None  . Alcohol Use: 0.0 oz/week    0 Glasses of wine per week     Comment: history of drinking a 1/2 glass  of wine in the evening   OB History    Gravida Para Term Preterm AB TAB SAB Ectopic Multiple Living   5 2 1 1 3  3   2      Review of Systems  A complete 10 system review of systems was obtained and all systems are negative except as noted in the HPI and PMH.   Allergies  Penicillins and Sulfa antibiotics  Home Medications   Prior to Admission medications   Medication Sig Start Date End Date Taking? Authorizing Provider  apixaban (ELIQUIS) 2.5 MG TABS tablet Take 1 tablet by mouth  twice a day 04/03/15  Yes Mihai Croitoru, MD  carvedilol (COREG) 6.25 MG tablet Take 1 tablet  by mouth 2  times daily with a meal. 06/18/15  Yes Mihai Croitoru, MD  furosemide (LASIX) 20 MG tablet Take 1 tablet (20 mg total) by mouth daily. Resume on 7/31 06/26/15  Yes Mihai Croitoru, MD  levofloxacin (LEVAQUIN) 500 MG tablet Take 1 tablet (500 mg total) by mouth daily. 07/01/15   Nat Christen, MD  promethazine-codeine [facility] WITH CODEINE) 6.25-10 MG/5ML syrup Take 5 mLs by mouth every 6 (six) hours as needed for cough. 07/01/15   Nat Christen, MD   BP 118/62 mmHg  Pulse 80  Temp(Src) 98 F (36.7 C) (Oral)  Resp 16  Ht 5\' 6"  (1.676 m)  Wt 140 lb (63.504 kg)  BMI 22.61 kg/m2  SpO2 100% Physical Exam  Constitutional: She is oriented to person, place, and time. She appears well-developed and well-nourished.  Alert, no obvious dysthymia. Mild wheeze on right lung  HENT:  Head: Normocephalic and atraumatic.  Eyes: Conjunctivae and EOM are normal. Pupils are equal, round, and reactive to light.  Neck: Normal range of motion. Neck supple.  Cardiovascular: Normal rate and regular rhythm.   Pulmonary/Chest: Effort normal and breath sounds normal.  Abdominal: Soft. Bowel sounds are normal.  Musculoskeletal: Normal range of motion.  Neurological: She is alert and oriented to person, place, and time.  Skin: Skin is warm and dry.  Psychiatric: She has a normal mood and affect. Her behavior is normal.   Nursing note and vitals reviewed.   ED Course  Procedures  DIAGNOSTIC STUDIES:    Oxygen Saturation is 100% on RA, normal by my interpretation.   COORDINATION OF CARE:  11:49 AM Will order x-ray and will administer pt breathing treatment in the ED. Will prescribe pt antibiotic, inhaler, and cough syrup. Discussed treatment plan with pt at bedside and pt agreed to plan.   Imaging Review Dg Chest 2 View  07/01/2015  CLINICAL DATA:  82 year old female with acute cough. EXAM: CHEST  2 VIEW COMPARISON:  12/04/2014 and prior chest radiographs FINDINGS: Cardiomegaly and  left-sided pacemaker/ICD again noted. Hyperinflation again noted. There is no evidence of focal airspace disease, pulmonary edema, suspicious pulmonary nodule/mass, pleural effusion, or pneumothorax. No acute bony abnormalities are identified. Cervical spine fusion changes again noted. IMPRESSION: Cardiomegaly and hyperinflation without evidence of acute cardiopulmonary disease. Electronically Signed   By: Margarette Canada M.D.   On: 07/01/2015 13:28   I have personally reviewed and evaluated these images as part of my medical decision-making.   EKG Interpretation   Date/Time:  Sunday July 01 2015 11:25:36 EST Ventricular Rate:  73 PR Interval:    QRS Duration: 148 QT Interval:  462 QTC Calculation: 508 R Axis:   -47 Text Interpretation:  Ventricular-paced rhythm Abnormal ECG Confirmed by  Leaann Nevils  MD, Cipriano Millikan ([id number]) on 07/01/2015 12:54:23 PM      MDM   Final diagnoses:  URI (upper respiratory infection)   Chest x-ray shows no obvious pneumonia. However, patient is elderly and immunocompromised. Will start antibiotics. Discussed with patient and her daughter.  I personally performed the services described in this documentation, which was scribed in my presence. The recorded information has been reviewed and is accurate.     Nat Christen, MD 07/02/15 [phone]

## 2015-07-01 NOTE — ED Notes (Signed)
Pt returned from xray

## 2015-07-01 NOTE — ED Notes (Signed)
Pt states she is feeling much better and wishes to be removed from the monitor to get dressed.

## 2015-07-27 ENCOUNTER — Ambulatory Visit (INDEPENDENT_AMBULATORY_CARE_PROVIDER_SITE_OTHER): Payer: Medicare Other

## 2015-07-27 DIAGNOSIS — Z950 Presence of cardiac pacemaker: Secondary | ICD-10-CM

## 2015-07-27 DIAGNOSIS — I5043 Acute on chronic combined systolic (congestive) and diastolic (congestive) heart failure: Secondary | ICD-10-CM | POA: Diagnosis not present

## 2015-07-27 NOTE — Progress Notes (Signed)
EPIC Encounter for ICM Monitoring  Patient Name: Kimberly [name] is a 82 y.o. female Date: 07/27/2015 Primary Care Physican: Wardell Honour, MD Primary Cardiologist: Croitoru Electrophysiologist: Croitoru Dry Weight: 140 lb   Bi-V Pacing 98.9%      In the past month, have you:  1. Gained more than 2 pounds in a day or more than 5 pounds in a week? no  2. Had changes in your medications (with verification of current medications)? no  3. Had more shortness of breath than is usual for you? no  4. Limited your activity because of shortness of breath? no  5. Not been able to sleep because of shortness of breath? no  6. Had increased swelling in your feet or ankles? no  7. Had symptoms of dehydration (dizziness, dry mouth, increased thirst, decreased urine output) no  8. Had changes in sodium restriction? no  9. Been compliant with medication? Yes   ICM trend: 3 month view for 07/27/2015   ICM trend: 1 year view for 07/27/2015   Follow-up plan: ICM clinic phone appointment on 08/29/2015.  Thoracic impedance below reference line from 07/10/2015 to 07/23/2015 suggesting fluid accumulation and returning toward reference line 07/24/2015.  Patient denied any f;iod symptoms.  She stated she tries to eat heart healthy foods and soups.   Advised heart healthy foods can still contain a lot of sodium.    Education given to limit sodium intake to < 2000 mg and fluid intake to 64 oz daily.  Encouraged to call for any fluid symptoms.  No changes today.    Advised would send to Dr Sallyanne Kuster for review and if any recommendations will call her back.    Copy of note sent to patient's primary care physician, primary cardiologist, and device following physician.  Rosalene Billings, RN, CCM 07/27/2015 1:09 PM

## 2015-07-27 NOTE — Progress Notes (Signed)
Sanda Klein, MD   Sent: Fri July 27, 2015 1:23 PM    To: Rosalene Billings, RN        Message     Thanks

## 2015-08-29 ENCOUNTER — Telehealth: Payer: Self-pay

## 2015-08-29 ENCOUNTER — Ambulatory Visit (INDEPENDENT_AMBULATORY_CARE_PROVIDER_SITE_OTHER): Payer: Medicare Other

## 2015-08-29 ENCOUNTER — Telehealth: Payer: Self-pay | Admitting: Cardiology

## 2015-08-29 DIAGNOSIS — I5043 Acute on chronic combined systolic (congestive) and diastolic (congestive) heart failure: Secondary | ICD-10-CM

## 2015-08-29 DIAGNOSIS — Z950 Presence of cardiac pacemaker: Secondary | ICD-10-CM | POA: Diagnosis not present

## 2015-08-29 NOTE — Progress Notes (Signed)
EPIC Encounter for ICM Monitoring  Patient Name: Kimberly [name] is a 81 y.o. female Date: 08/29/2015 Primary Care Physican: Wardell Honour, MD Primary Cardiologist: Croitoru Electrophysiologist: Croitoru Dry Weight: unknown  Bi-V Pacing 99.2%     In the past month, have you:  1. Gained more than 2 pounds in a day or more than 5 pounds in a week? N/A  2. Had changes in your medications (with verification of current medications)? N/A  3. Had more shortness of breath than is usual for you? N/A  4. Limited your activity because of shortness of breath? N/A  5. Not been able to sleep because of shortness of breath? N/A  6. Had increased swelling in your feet or ankles? N/A  7. Had symptoms of dehydration (dizziness, dry mouth, increased thirst, decreased urine output) N/A  8. Had changes in sodium restriction? N/A  9. Been compliant with medication? N/A  ICM trend: 3 month view for 08/29/2015  ICM trend: 1 year view for 08/29/2015   Follow-up plan: ICM clinic phone appointment 09/12/2015.  Attempted patient call and no answer or answering machine.  Reviewed transmission.  FLUID LEVELS: Optivol thoracic impedance decreased 07/11/2015 to 08/29/2015 suggesting fluid accumulation.  Will repeat transmission on 09/12/2015.    Unable to reach patient but will send to Dr Sallyanne Kuster for [facility].     Rosalene Billings, RN, CCM 08/29/2015 1:09 PM

## 2015-08-29 NOTE — Telephone Encounter (Signed)
Remote ICM transmission received.  Attempted patient call and left message for return call.   

## 2015-08-29 NOTE — Telephone Encounter (Signed)
Attempted to confirm remote transmission with pt. No answer and was unable to leave a message.   

## 2015-08-30 ENCOUNTER — Telehealth: Payer: Self-pay | Admitting: Cardiovascular Disease

## 2015-08-30 NOTE — Telephone Encounter (Signed)
-----   Message from Sanda Klein, MD sent at 08/30/2015  8:17 AM EDT ----- Can you please reach her and see if she is taking meds and if she has dyspnea, edema, weight gain? MCr ----- Message -----    From: Rosalene Billings, RN    Sent: 08/29/2015   2:51 PM      To: Sanda Klein, MD

## 2015-08-30 NOTE — Telephone Encounter (Signed)
Spoke with patient  -- no more shortness of breath than usual  -- no edema -- no weight gain -- compliant with meds

## 2015-08-30 NOTE — Telephone Encounter (Signed)
Thank you, Kimberly [name]

## 2015-09-12 ENCOUNTER — Ambulatory Visit (INDEPENDENT_AMBULATORY_CARE_PROVIDER_SITE_OTHER): Payer: Medicare Other | Admitting: *Deleted

## 2015-09-12 ENCOUNTER — Telehealth: Payer: Self-pay | Admitting: Cardiology

## 2015-09-12 DIAGNOSIS — I5043 Acute on chronic combined systolic (congestive) and diastolic (congestive) heart failure: Secondary | ICD-10-CM | POA: Diagnosis not present

## 2015-09-12 DIAGNOSIS — Z950 Presence of cardiac pacemaker: Secondary | ICD-10-CM | POA: Diagnosis not present

## 2015-09-12 DIAGNOSIS — I442 Atrioventricular block, complete: Secondary | ICD-10-CM

## 2015-09-12 NOTE — Progress Notes (Signed)
EPIC Encounter for ICM Monitoring  Patient Name: Kimberly [name] is a 82 y.o. female Date: 09/12/2015 Primary Care Physican: Wardell Honour, MD Primary Cardiologist: Croitoru Electrophysiologist: Croitoru Dry Weight: 140 lbs    Bi-V Pacing 99.3%      In the past month, have you:  1. Gained more than 2 pounds in a day or more than 5 pounds in a week? no  2. Had changes in your medications (with verification of current medications)? no  3. Had more shortness of breath than is usual for you? no  4. Limited your activity because of shortness of breath? no  5. Not been able to sleep because of shortness of breath? no  6. Had increased swelling in your feet, ankles, legs or stomach area? no  7. Had symptoms of dehydration (dizziness, dry mouth, increased thirst, decreased urine output) no  8. Had changes in sodium restriction? no  9. Been compliant with medication? Yes  ICM trend: 3 month view for 09/12/2015   ICM trend: 1 year view for 09/12/2015   Follow-up plan: ICM clinic phone appointment 10/05/2015.    FLUID LEVELS:  Since last ICM transmission on 08/29/2015, Optivol thoracic impedance continues to be below baseline suggesting fluid accumulation.    SYMPTOMS:   None.  She has denied any symptoms such as weight gain of 3 pounds overnight or 5 pound within a week, SOB and/or lower extremity swelling.  Advised to call should she have any of these symptoms.   No changes today.    Advised will send to PCP, primary cardiologist and device following physician for review for decreased thoracic impedance and will call back if any recommendations.     Rosalene Billings, RN, CCM 09/12/2015 2:22 PM

## 2015-09-12 NOTE — Progress Notes (Signed)
I'm not so sure were getting much benefit from monitoring thoracic impedance. I would discontinue the monthly downloads

## 2015-09-12 NOTE — Telephone Encounter (Signed)
Spoke with pt and reminded pt of remote transmission that is due today. Pt verbalized understanding.   

## 2015-09-13 NOTE — Progress Notes (Signed)
Remote pacemaker transmission.   

## 2015-09-13 NOTE — Progress Notes (Signed)
Call to patient and advised of Dr Croitoru's recommendation to discontinue ICM monthly transmissions.  Advised 3 month remote transmissions will continue.  Recommended she call Dr Victorino December for any fluid symptoms and reviewed symptoms to report.  She agreed to call if she has any symptoms.

## 2015-09-17 ENCOUNTER — Telehealth: Payer: Self-pay | Admitting: Cardiovascular Disease

## 2015-09-17 NOTE — Telephone Encounter (Signed)
Spoke with pt she c/o of swelling in her ankles since Friday last week. She has been taking medications as prescribed. She states that swelling goes away at night with rest and comes back throughout the day. Pt states she keeps her feel elevated when resting but does not wear compression stocking as it is to uncomfortable to wear. She states her right ankle is worse than her left. She has no c/o of SOB changes from baseline. Told her would forward to provider and get back with her when with any of his recommendations, if any. Did make suggestion that is she can tolerate for any amount of time to wear compression stocking during the day it would help, she stated she is not going to do that.

## 2015-09-17 NOTE — Telephone Encounter (Signed)
Pt has experienced no changes in her weight over the past few days. The was remined of recommendations listed below and to call our office is she notices swelling is not going away at night, or other symptoms start. Pt verbalized understanding, no additional questions at this time.

## 2015-09-17 NOTE — Telephone Encounter (Signed)
Has she weighed herself? Is her weight higher than baseline? If yes, then extra diuretic could help, but if no, the advice you gacve regarding compression stockings and leg elevation is appropriate.

## 2015-09-17 NOTE — Telephone Encounter (Signed)
New message     Pt c/o swelling: STAT is pt has developed SOB within 24 hours  1. How long have you been experiencing swelling? Since sat or sun 2. Where is the swelling located? Ankles and feet 3.  Are you currently taking a "fluid pill"? yes 4.  Are you currently SOB? no 5.  Have you traveled recently? no

## 2015-10-10 ENCOUNTER — Encounter: Payer: Self-pay | Admitting: Cardiology

## 2015-10-17 LAB — CUP PACEART REMOTE DEVICE CHECK
Battery Remaining Longevity: 49 mo
Battery Voltage: 2.98 V
Brady Statistic AP VP Percent: 0 %
Brady Statistic AP VS Percent: 0 %
Brady Statistic AS VP Percent: 100 %
Brady Statistic AS VS Percent: 0 %
Brady Statistic RA Percent Paced: 0 %
Brady Statistic RV Percent Paced: 100 %
Date Time Interrogation Session: 20170510204601
Implantable Lead Implant Date: 19971124
Implantable Lead Implant Date: 20070831
Implantable Lead Location: 753858
Implantable Lead Location: 753860
Implantable Lead Model: 4024
Implantable Lead Model: 4194
Lead Channel Impedance Value: 4047 Ohm
Lead Channel Impedance Value: 4047 Ohm
Lead Channel Impedance Value: 494 Ohm
Lead Channel Impedance Value: 551 Ohm
Lead Channel Impedance Value: 646 Ohm
Lead Channel Impedance Value: 646 Ohm
Lead Channel Impedance Value: 684 Ohm
Lead Channel Impedance Value: 703 Ohm
Lead Channel Impedance Value: 912 Ohm
Lead Channel Pacing Threshold Amplitude: 0.75 V
Lead Channel Pacing Threshold Amplitude: 1.25 V
Lead Channel Pacing Threshold Pulse Width: 0.4 ms
Lead Channel Pacing Threshold Pulse Width: 1 ms
Lead Channel Sensing Intrinsic Amplitude: 11.625 mV
Lead Channel Sensing Intrinsic Amplitude: 11.625 mV
Lead Channel Setting Pacing Amplitude: 2 V
Lead Channel Setting Pacing Amplitude: 2.25 V
Lead Channel Setting Pacing Pulse Width: 0.4 ms
Lead Channel Setting Pacing Pulse Width: 1 ms
Lead Channel Setting Sensing Sensitivity: 2.8 mV

## 2015-12-13 ENCOUNTER — Ambulatory Visit (INDEPENDENT_AMBULATORY_CARE_PROVIDER_SITE_OTHER): Payer: Medicare Other | Admitting: *Deleted

## 2015-12-13 ENCOUNTER — Telehealth: Payer: Self-pay | Admitting: Cardiology

## 2015-12-13 DIAGNOSIS — I442 Atrioventricular block, complete: Secondary | ICD-10-CM

## 2015-12-13 DIAGNOSIS — Z950 Presence of cardiac pacemaker: Secondary | ICD-10-CM | POA: Diagnosis not present

## 2015-12-13 DIAGNOSIS — I5043 Acute on chronic combined systolic (congestive) and diastolic (congestive) heart failure: Secondary | ICD-10-CM

## 2015-12-13 NOTE — Progress Notes (Signed)
Remote pacemaker transmission.   

## 2015-12-13 NOTE — Telephone Encounter (Signed)
Spoke with pt and reminded pt of remote transmission that is due today. Pt verbalized understanding.   

## 2015-12-17 LAB — CUP PACEART REMOTE DEVICE CHECK
Battery Remaining Longevity: 44 mo
Brady Statistic AP VP Percent: 0 %
Brady Statistic AP VS Percent: 0 %
Brady Statistic AS VP Percent: 96.18 %
Brady Statistic AS VS Percent: 3.82 %
Brady Statistic RA Percent Paced: 0 %
Brady Statistic RV Percent Paced: 96.18 %
Implantable Lead Implant Date: 19971124
Implantable Lead Location: 753858
Implantable Lead Location: 753860
Implantable Lead Model: 4024
Implantable Lead Model: 4194
Lead Channel Impedance Value: 4047 Ohm
Lead Channel Impedance Value: 513 Ohm
Lead Channel Impedance Value: 570 Ohm
Lead Channel Impedance Value: 646 Ohm
Lead Channel Impedance Value: 684 Ohm
Lead Channel Impedance Value: 722 Ohm
Lead Channel Impedance Value: 760 Ohm
Lead Channel Impedance Value: 988 Ohm
Lead Channel Pacing Threshold Amplitude: 0.75 V
Lead Channel Pacing Threshold Amplitude: 1.375 V
Lead Channel Pacing Threshold Pulse Width: 0.4 ms
Lead Channel Setting Pacing Amplitude: 2 V
Lead Channel Setting Pacing Amplitude: 2.5 V
MDC IDC LEAD IMPLANT DT: 20070831
MDC IDC MSMT BATTERY VOLTAGE: 2.98 V
MDC IDC MSMT LEADCHNL LV PACING THRESHOLD PULSEWIDTH: 1 ms
MDC IDC MSMT LEADCHNL RA IMPEDANCE VALUE: 4047 Ohm
MDC IDC MSMT LEADCHNL RV SENSING INTR AMPL: 11.625 mV
MDC IDC MSMT LEADCHNL RV SENSING INTR AMPL: 11.625 mV
MDC IDC SESS DTM: 20170810220335
MDC IDC SET LEADCHNL LV PACING PULSEWIDTH: 1 ms
MDC IDC SET LEADCHNL RV PACING PULSEWIDTH: 0.4 ms
MDC IDC SET LEADCHNL RV SENSING SENSITIVITY: 2.8 mV

## 2015-12-18 ENCOUNTER — Encounter: Payer: Self-pay | Admitting: Cardiology

## 2016-01-23 ENCOUNTER — Telehealth: Payer: Self-pay | Admitting: Family Medicine

## 2016-01-23 NOTE — Telephone Encounter (Signed)
Patient has not been seen since 06/07/14. Advised patient that she would need to be seen in order to get a RX.

## 2016-02-06 ENCOUNTER — Other Ambulatory Visit: Payer: Self-pay | Admitting: Cardiovascular Disease

## 2016-03-11 ENCOUNTER — Encounter: Payer: Self-pay | Admitting: Cardiovascular Disease

## 2016-03-11 ENCOUNTER — Ambulatory Visit (INDEPENDENT_AMBULATORY_CARE_PROVIDER_SITE_OTHER): Payer: Medicare Other | Admitting: Cardiovascular Disease

## 2016-03-11 VITALS — BP 132/79 | HR 72 | Ht 66.0 in | Wt 146.6 lb

## 2016-03-11 DIAGNOSIS — Z23 Encounter for immunization: Secondary | ICD-10-CM | POA: Diagnosis not present

## 2016-03-11 DIAGNOSIS — Z950 Presence of cardiac pacemaker: Secondary | ICD-10-CM

## 2016-03-11 DIAGNOSIS — I482 Chronic atrial fibrillation: Secondary | ICD-10-CM

## 2016-03-11 DIAGNOSIS — I442 Atrioventricular block, complete: Secondary | ICD-10-CM

## 2016-03-11 DIAGNOSIS — I4821 Permanent atrial fibrillation: Secondary | ICD-10-CM

## 2016-03-11 DIAGNOSIS — I5032 Chronic diastolic (congestive) heart failure: Secondary | ICD-10-CM

## 2016-03-11 NOTE — Progress Notes (Signed)
Cardiology Office Note    Date:  03/11/2016   ID:  Kimberly [name], DOB Oct 31, 1926, MRN [id number]  PCP:  Wardell Honour, MD  Cardiologist:   Sanda Klein, MD   Chief Complaint  Patient presents with  . Annual Exam    pt denied chest pain and SOB    History of Present Illness:  Kimberly [name] is a 82 y.o. female with a history of permanent atrial fibrillation with AV node ablation and secondary complete heart block here for follow-up on her CRT-P device. She has mild diastolic heart failure.  She complains of some nasal congestion and the beginnings of a headache, which she thinks are related to sinusitis. She denies purulent nasal drainage, fever, facial tenderness or other intercurrent illness. There has been no recent change in her diuretic dose requirement and she has not required hospitalization for heart failure. She has edema towards the end of the day, limited to her feet and occasionally her ankles. It is routinely gone by the next morning. He has not had syncope, falls or bleeding problems since her last appointment.  Pacemaker interrogation shows normal device function. She has 98% biventricular pacing. The device has recorded only 2 episodes of nonsustained VT 1 and August the other in October, both of them brief and asymptomatic. Her thoracic impedance suggests that she is not fluid overloaded, for what it's worth. (Thoracic impedance has not correlated well with any episodes of fluid overload in the past: lots of "false positive" episodes of fluid overload).   her most recent echocardiogram performed July 2016 showed left ventricular ejection fraction 60-65 percent , severe biatrial dilatation, minor valvular abnormalities, peak PA systolic pressure 39 mmHg.  Kimberly [name] has a long-standing history of permanent atrial fibrillation s/p AV node ablation, complete heart block and nonischemic cardiomyopathy with an excellent response to cardiac resynchronization therapy. She  originally had a CRT-D device but at the time of generator change out was "downgraded" to a CRT-P. since her left ventricular ejection fraction increased to 50-55%. Her current device is a Risk analyst CRT-P [id number] implanted in June 2012. Her defibrillator lead, which was a [employer] fidelis lead is capped and an older RV pacemaker lead Medtronic [id number] implanted 1997 is being used. The left ventricular lead is a Medtronic [id number] implanted in 2007. She has long-standing problems with chronic scoliosis and fibromyalgia.  In December 2015 she was hospitalized in [place] and received 2 units of blood transfusion. No bleeding events since that time. Hospitalized in January 2016 after a fall at home,  Possibly precipitated by dehydration complicated by acute on chronic renal failure.   Past Medical History:  Diagnosis Date  . Acute blood loss anemia 10/11/2012  . Acute diverticulitis 08/24/2013  . Acute on chronic combined systolic and diastolic CHF, NYHA class 4 ([place]) 11/15/2013  . Antral ulcer 10/11/2012  . Arrhythmia    atrial fibb  . Atrial fibrillation ([place])   . Cardiomyopathy, nonischemic ([place])   . Chronic anticoagulation 10/12/2012  . CKD (chronic kidney disease) stage 3, GFR 30-59 ml/min 10/12/2012  . Depression   . Erosive esophagitis 10/11/2012  . Fibromyalgia   . Glaucoma   . H/O echocardiogram 2007   EF 40-45%,         . Hypertension   . Osteoarthritis   . Pacemaker    Last saw cards 07/2013  . Scoliosis     Past Surgical History:  Procedure Laterality Date  . ABDOMINAL HYSTERECTOMY    .  APPENDECTOMY    . BACK SURGERY    . BREAST SURGERY    . CARDIAC CATHETERIZATION  12/08/2005   LAD AND LEFT MAIN WITH NO HIGH-GRADE STENOSIS. MILD DISEASE IN THE CX AND LAD SYSTEM. SEVERE LV DYSFUNCTION WITH DILATION OF THE LV. EF 15-20%. LV END-DIASTOLIC PRESSURE IS 90. +1 MR.  . CHOLECYSTECTOMY    . CYSTOSCOPY N/A 02/24/2013   Procedure: CYSTOSCOPY WITH URETHRAL DILITATION;  Surgeon: Marissa Nestle, MD;  Location: [facility];  Service: Urology;  Laterality: N/A;  . DOPPLER ECHOCARDIOGRAPHY N/A 05/30/2010   LV SIZE IS NORMAL. LV SYSTOLIC FUNCTION IS LOW NORMAL. EF=50-55%. MILD INFERIOR HYPOKINESIS.MILD TO MODERATE POSTERIOR WALL HYPOKINESIS.PACEMAKER LEAD IN THE RV. LA IS MILDLY DILATED. RA IS MODERATE TO SEVERLY DILATED. PACEMAKER LEAD IN THE RA. MILD CALCICICATION OF THE MV APPARATUS. MODERATE MR. MILD TO MODERATE TR. MILD PHTN.AV MILDLY SCLEROTIC.  [name] ESOPHAGOGASTRODUODENOSCOPY N/A 10/13/2012   Dr. Gala Romney: severe ulcerative reflux esophagitis, question of Barrett's but negative path, single deep prepyloric antral ulcer, negative H.pylori  . HERNIA REPAIR     right inguinal hernia and umbilical  . LOWETR EXT VENOUS Bilateral 11-08-10   R & L- NO EVIDENCE OF THROMBUS OR THROMBOPHLEBITIS. THERE IS MILD AMOUNT OF SUBCUTANEOUS EDEMA NOTED WITHIN THE LEFT CALF AND ANKLE. R & L GSV AND SSV- NO VENOUS INSUFF NOTED.  [name] NECK SURGERY    . NUCLEAR STRESS TEST N/A 02/13/2009   NORMAL PATTERN OF PERFUSION IN ALL REGIONS. POST STRESS VENTICULAR SIZE IS NORMAL. POST  STESS EF 85%.  NORMAL MYOCARDIAL PERFUSION STUDY.  [name] PACEMAKER INSERTION    . TONSILLECTOMY    . YAG LASER APPLICATION Bilateral 08/11/8117   Procedure: YAG LASER APPLICATION;  Surgeon: Williams Che, MD;  Location: [facility];  Service: Ophthalmology;  Laterality: Bilateral;    Current Medications: Outpatient Medications Prior to Visit  Medication Sig Dispense Refill  . apixaban (ELIQUIS) 2.5 MG TABS tablet Take 1 tablet by mouth  twice a day 180 tablet 3  . carvedilol (COREG) 6.25 MG tablet TAKE 1 TABLET  BY MOUTH 2  TIMES DAILY WITH A MEAL. 180 tablet 0  . furosemide (LASIX) 20 MG tablet Take 1 tablet (20 mg total) by mouth daily. Resume on 7/31 90 tablet 3  . levofloxacin (LEVAQUIN) 500 MG tablet Take 1 tablet (500 mg total) by mouth daily. 10 tablet 0  . promethazine-codeine (PHENERGAN WITH CODEINE) 6.25-10 MG/5ML syrup Take 5 mLs by  mouth every 6 (six) hours as needed for cough. 120 mL 0   No facility-administered medications prior to visit.      Allergies:   Penicillins and Sulfa antibiotics   Social History   Social History  . Marital status: Widowed    Spouse name: N/A  . Number of children: N/A  . Years of education: N/A   Occupational History  . telephone operator Retired    retired   Social History Main Topics  . Smoking status: Former Smoker    Packs/day: 0.50    Years: 30.00    Types: Cigarettes    Quit date: 12/13/2004  . Smokeless tobacco: Not on file  . Alcohol use 0.0 oz/week     Comment: history of drinking a 1/2 glass of wine in the evening  . Drug use: No  . Sexual activity: No   Other Topics Concern  . Not on file   Social History Narrative  . No narrative on file     Family History:  The patient's family history includes Asthma in her sister; Cancer in her sister; Heart failure in her brother.   ROS:   Please see the history of present illness.    ROS All other systems reviewed and are negative.   PHYSICAL EXAM:   VS:  BP 132/79   Pulse 72   Ht 5\' 6"  (1.676 m)   Wt 146 lb 9.6 oz (66.5 kg)   BMI 23.66 kg/m    GEN: Well nourished, well developed, in no acute distress  HEENT: normal  Neck: no JVD, carotid bruits, or masses Cardiac: RRR; no murmurs, rubs, or gallops,no edema , healthy subclavian pacemaker site. Respiratory:  clear to auscultation bilaterally, normal work of breathing GI: soft, nontender, nondistended, + BS MS: no deformity or atrophy  Skin: warm and dry, no rash Neuro:  Alert and Oriented x 3, Strength and sensation are intact Psych: euthymic mood, full affect  Wt Readings from Last 3 Encounters:  03/11/16 146 lb 9.6 oz (66.5 kg)  07/01/15 140 lb (63.5 kg)  03/21/15 150 lb 3 oz (68.1 kg)      Studies/Labs Reviewed:   EKG:  EKG is ordered today.  The ekg ordered today demonstrates Atrial fibrillation and ventricular paced rhythm, QTC 514 ms.  Positive R in lead V1 consistent with effective left ventricular pacing  Recent Labs: No results found for requested labs within last 8760 hours.   Lipid Panel    Component Value Date/Time   CHOL  02/06/2009 0450    181        ATP III CLASSIFICATION:  <200     mg/dL   Desirable  200-239  mg/dL   Borderline High  >=240    mg/dL   High          TRIG 81 02/06/2009 0450   HDL 78 02/06/2009 0450   CHOLHDL 2.3 02/06/2009 0450   VLDL 16 02/06/2009 0450   LDLCALC  02/06/2009 0450    87        Total Cholesterol/HDL:CHD Risk Coronary Heart Disease Risk Table                     Men   Women  1/2 Average Risk   3.4   3.3  Average Risk       5.0   4.4  2 X Average Risk   9.6   7.1  3 X Average Risk  23.4   11.0        Use the calculated Patient Ratio above and the CHD Risk Table to determine the patient's CHD Risk.        ATP III CLASSIFICATION (LDL):  <100     mg/dL   Optimal  100-129  mg/dL   Near or Above                    Optimal  130-159  mg/dL   Borderline  160-189  mg/dL   High  >190     mg/dL   Very High    ASSESSMENT:    1. Permanent atrial fibrillation ([place])   2. Complete heart block ([place])   3. Biventricular cardiac pacemaker in situ   4. Chronic diastolic heart failure ([place])   5. Encounter for immunization      PLAN:  In order of problems listed above:  1. AFib s/p AV node ablation: On appropriate long-term anticoagulation without recent bleeding problems 2. CHB: Pacemaker dependent 3. CRT-P: Normal device function with  good biventricular pacing percentage. Remote download every 3 months and yearly office visit 4. CHF: With recovered left ventricular ejection fraction, most recently estimated at 60%. She requires a very low dose of loop diuretic therapy.  Flu shot administered today at the patient's request. Okay to use over-the-counter antihistamines for sinus symptoms, but avoid pseudoephedrine containing combination drugs.  Medication Adjustments/Labs  and Tests Ordered: Current medicines are reviewed at length with the patient today.  Concerns regarding medicines are outlined above.  Medication changes, Labs and Tests ordered today are listed in the Patient Instructions below. Patient Instructions  Dr Sallyanne Kuster recommends that you continue on your current medications as directed. Please refer to the Current Medication list given to you today.  OKAY to take either Claritin (loratidine) 10 mg once daily OR Allegra (fexofenadine) 30 mg once daily for sinuses.  Remote monitoring is used to monitor your Pacemaker of ICD from home. This monitoring reduces the number of office visits required to check your device to one time per year. It allows [place] to keep an eye on the functioning of your device to ensure it is working properly. You are scheduled for a device check from home on Tuesday, February 6th, 2018. You may send your transmission at any time that day. If you have a wireless device, the transmission will be sent automatically. After your physician reviews your transmission, you will receive a postcard with your next transmission date.  Dr Sallyanne Kuster recommends that you schedule a follow-up appointment in 12 months with a pacemaker check. You will receive a reminder letter in the mail two months in advance. If you don't receive a letter, please call our office to schedule the follow-up appointment.  If you need a refill on your cardiac medications before your next appointment, please call your pharmacy.    Signed, Sanda Klein, MD  03/11/2016 2:25 PM    [facility] [address], [place], [place]  [address] Phone: [phone]; Fax: [phone]

## 2016-03-11 NOTE — Patient Instructions (Addendum)
Dr Sallyanne Kuster recommends that you continue on your current medications as directed. Please refer to the Current Medication list given to you today.  OKAY to take either Claritin (loratidine) 10 mg once daily OR Allegra (fexofenadine) 30 mg once daily for sinuses.  Remote monitoring is used to monitor your Pacemaker of ICD from home. This monitoring reduces the number of office visits required to check your device to one time per year. It allows [place] to keep an eye on the functioning of your device to ensure it is working properly. You are scheduled for a device check from home on Tuesday, February 6th, 2018. You may send your transmission at any time that day. If you have a wireless device, the transmission will be sent automatically. After your physician reviews your transmission, you will receive a postcard with your next transmission date.  Dr Sallyanne Kuster recommends that you schedule a follow-up appointment in 12 months with a pacemaker check. You will receive a reminder letter in the mail two months in advance. If you don't receive a letter, please call our office to schedule the follow-up appointment.  If you need a refill on your cardiac medications before your next appointment, please call your pharmacy.

## 2016-03-17 ENCOUNTER — Other Ambulatory Visit: Payer: Self-pay | Admitting: Cardiovascular Disease

## 2016-04-07 ENCOUNTER — Other Ambulatory Visit: Payer: Self-pay | Admitting: Cardiovascular Disease

## 2016-04-07 DIAGNOSIS — Z950 Presence of cardiac pacemaker: Secondary | ICD-10-CM

## 2016-04-07 DIAGNOSIS — I5043 Acute on chronic combined systolic (congestive) and diastolic (congestive) heart failure: Secondary | ICD-10-CM

## 2016-04-07 NOTE — Telephone Encounter (Signed)
Rx(s) sent to pharmacy electronically.  

## 2016-05-07 ENCOUNTER — Other Ambulatory Visit: Payer: Self-pay

## 2016-05-07 MED ORDER — CARVEDILOL 6.25 MG PO TABS
6.2500 mg | ORAL_TABLET | Freq: Two times a day (BID) | ORAL | 1 refills | Status: DC
Start: 2016-05-07 — End: 2016-11-03

## 2016-05-24 LAB — CUP PACEART INCLINIC DEVICE CHECK
Battery Remaining Longevity: 47 mo
Battery Voltage: 2.98 V
Brady Statistic AS VP Percent: 97.87 %
Brady Statistic RA Percent Paced: 0 %
Date Time Interrogation Session: 20171108003632
Implantable Lead Implant Date: 19971124
Implantable Lead Implant Date: 20070831
Implantable Lead Location: 753858
Implantable Lead Model: 4194
Implantable Pulse Generator Implant Date: 20120619
Lead Channel Impedance Value: 1007 Ohm
Lead Channel Impedance Value: 4047 Ohm
Lead Channel Impedance Value: 4047 Ohm
Lead Channel Impedance Value: 551 Ohm
Lead Channel Impedance Value: 741 Ohm
Lead Channel Pacing Threshold Amplitude: 0.625 V
Lead Channel Pacing Threshold Pulse Width: 0.4 ms
Lead Channel Pacing Threshold Pulse Width: 1 ms
Lead Channel Sensing Intrinsic Amplitude: 11.625 mV
Lead Channel Setting Pacing Amplitude: 2 V
Lead Channel Setting Pacing Amplitude: 2.5 V
Lead Channel Setting Pacing Pulse Width: 0.4 ms
Lead Channel Setting Pacing Pulse Width: 1 ms
Lead Channel Setting Sensing Sensitivity: 2.8 mV
MDC IDC LEAD LOCATION: 753860
MDC IDC MSMT LEADCHNL LV IMPEDANCE VALUE: 513 Ohm
MDC IDC MSMT LEADCHNL LV IMPEDANCE VALUE: 779 Ohm
MDC IDC MSMT LEADCHNL LV PACING THRESHOLD AMPLITUDE: 1.375 V
MDC IDC MSMT LEADCHNL RV IMPEDANCE VALUE: 646 Ohm
MDC IDC MSMT LEADCHNL RV IMPEDANCE VALUE: 684 Ohm
MDC IDC MSMT LEADCHNL RV SENSING INTR AMPL: 11.625 mV
MDC IDC STAT BRADY AP VP PERCENT: 0 %
MDC IDC STAT BRADY AP VS PERCENT: 0 %
MDC IDC STAT BRADY AS VS PERCENT: 2.13 %
MDC IDC STAT BRADY RV PERCENT PACED: 99.15 %

## 2016-06-10 ENCOUNTER — Telehealth: Payer: Self-pay | Admitting: Cardiology

## 2016-06-10 ENCOUNTER — Ambulatory Visit (INDEPENDENT_AMBULATORY_CARE_PROVIDER_SITE_OTHER): Payer: Medicare Other | Admitting: *Deleted

## 2016-06-10 DIAGNOSIS — I442 Atrioventricular block, complete: Secondary | ICD-10-CM

## 2016-06-10 NOTE — Progress Notes (Signed)
Remote pacemaker transmission.   

## 2016-06-10 NOTE — Telephone Encounter (Signed)
Spoke with pt and reminded pt of remote transmission that is due today. Pt verbalized understanding.   

## 2016-06-11 LAB — CUP PACEART REMOTE DEVICE CHECK
Battery Remaining Longevity: 43 mo
Brady Statistic AP VP Percent: 0 %
Brady Statistic AS VP Percent: 96.81 %
Brady Statistic AS VS Percent: 3.19 %
Brady Statistic RA Percent Paced: 0 %
Brady Statistic RV Percent Paced: 98.98 %
Implantable Lead Location: 753858
Implantable Lead Location: 753860
Implantable Lead Model: 4024
Implantable Lead Model: 4194
Implantable Pulse Generator Implant Date: 20120619
Lead Channel Impedance Value: 4047 Ohm
Lead Channel Impedance Value: 513 Ohm
Lead Channel Impedance Value: 608 Ohm
Lead Channel Impedance Value: 627 Ohm
Lead Channel Impedance Value: 760 Ohm
Lead Channel Impedance Value: 969 Ohm
Lead Channel Pacing Threshold Amplitude: 0.625 V
Lead Channel Pacing Threshold Amplitude: 1 V
Lead Channel Setting Pacing Amplitude: 2 V
Lead Channel Setting Pacing Amplitude: 2 V
Lead Channel Setting Pacing Pulse Width: 0.4 ms
Lead Channel Setting Pacing Pulse Width: 1 ms
Lead Channel Setting Sensing Sensitivity: 2.8 mV
MDC IDC LEAD IMPLANT DT: 19971124
MDC IDC LEAD IMPLANT DT: 20070831
MDC IDC MSMT BATTERY VOLTAGE: 2.97 V
MDC IDC MSMT LEADCHNL LV IMPEDANCE VALUE: 551 Ohm
MDC IDC MSMT LEADCHNL LV IMPEDANCE VALUE: 703 Ohm
MDC IDC MSMT LEADCHNL LV PACING THRESHOLD PULSEWIDTH: 1 ms
MDC IDC MSMT LEADCHNL RA IMPEDANCE VALUE: 4047 Ohm
MDC IDC MSMT LEADCHNL RV PACING THRESHOLD PULSEWIDTH: 0.4 ms
MDC IDC MSMT LEADCHNL RV SENSING INTR AMPL: 11.625 mV
MDC IDC MSMT LEADCHNL RV SENSING INTR AMPL: 11.625 mV
MDC IDC SESS DTM: 20180207020941
MDC IDC STAT BRADY AP VS PERCENT: 0 %

## 2016-06-13 ENCOUNTER — Encounter: Payer: Self-pay | Admitting: Cardiology

## 2016-06-24 ENCOUNTER — Emergency Department (HOSPITAL_COMMUNITY): Payer: Medicare Other

## 2016-06-24 ENCOUNTER — Emergency Department (HOSPITAL_COMMUNITY)
Admission: EM | Admit: 2016-06-24 | Discharge: 2016-06-24 | Disposition: A | Discharge status: Home / Self Care | Payer: Medicare Other | Attending: Emergency Medicine | Admitting: Emergency Medicine

## 2016-06-24 ENCOUNTER — Encounter (HOSPITAL_COMMUNITY): Payer: Self-pay | Admitting: *Deleted

## 2016-06-24 DIAGNOSIS — R110 Nausea: Secondary | ICD-10-CM | POA: Insufficient documentation

## 2016-06-24 DIAGNOSIS — I5043 Acute on chronic combined systolic (congestive) and diastolic (congestive) heart failure: Secondary | ICD-10-CM | POA: Diagnosis not present

## 2016-06-24 DIAGNOSIS — I130 Hypertensive heart and chronic kidney disease with heart failure and stage 1 through stage 4 chronic kidney disease, or unspecified chronic kidney disease: Secondary | ICD-10-CM | POA: Insufficient documentation

## 2016-06-24 DIAGNOSIS — K5900 Constipation, unspecified: Secondary | ICD-10-CM | POA: Insufficient documentation

## 2016-06-24 DIAGNOSIS — R1031 Right lower quadrant pain: Secondary | ICD-10-CM | POA: Insufficient documentation

## 2016-06-24 DIAGNOSIS — R10814 Left lower quadrant abdominal tenderness: Secondary | ICD-10-CM | POA: Diagnosis not present

## 2016-06-24 DIAGNOSIS — M549 Dorsalgia, unspecified: Secondary | ICD-10-CM | POA: Insufficient documentation

## 2016-06-24 DIAGNOSIS — R1084 Generalized abdominal pain: Secondary | ICD-10-CM

## 2016-06-24 DIAGNOSIS — R109 Unspecified abdominal pain: Secondary | ICD-10-CM | POA: Diagnosis not present

## 2016-06-24 DIAGNOSIS — N183 Chronic kidney disease, stage 3 (moderate): Secondary | ICD-10-CM | POA: Diagnosis not present

## 2016-06-24 DIAGNOSIS — Z79899 Other long term (current) drug therapy: Secondary | ICD-10-CM | POA: Diagnosis not present

## 2016-06-24 DIAGNOSIS — Z87891 Personal history of nicotine dependence: Secondary | ICD-10-CM | POA: Insufficient documentation

## 2016-06-24 DIAGNOSIS — G8929 Other chronic pain: Secondary | ICD-10-CM | POA: Insufficient documentation

## 2016-06-24 DIAGNOSIS — M545 Low back pain: Secondary | ICD-10-CM | POA: Diagnosis not present

## 2016-06-24 DIAGNOSIS — K2970 Gastritis, unspecified, without bleeding: Secondary | ICD-10-CM | POA: Diagnosis not present

## 2016-06-24 LAB — COMPREHENSIVE METABOLIC PANEL
ALK PHOS: 48 U/L (ref 38–126)
ALT: 12 U/L — AB (ref 14–54)
AST: 24 U/L (ref 15–41)
Albumin: 3.6 g/dL (ref 3.5–5.0)
Anion gap: 8 (ref 5–15)
BILIRUBIN TOTAL: 0.5 mg/dL (ref 0.3–1.2)
BUN: 17 mg/dL (ref 6–20)
CALCIUM: 8.8 mg/dL — AB (ref 8.9–10.3)
CO2: 28 mmol/L (ref 22–32)
CREATININE: 1.39 mg/dL — AB (ref 0.44–1.00)
Chloride: 103 mmol/L (ref 101–111)
GFR calc Af Amer: 38 mL/min — ABNORMAL LOW (ref >60)
GFR calc non Af Amer: 33 mL/min — ABNORMAL LOW (ref >60)
Glucose, Bld: 98 mg/dL (ref 65–99)
Potassium: 3.4 mmol/L — ABNORMAL LOW (ref 3.5–5.1)
Sodium: 139 mmol/L (ref 135–145)
Total Protein: 7.1 g/dL (ref 6.5–8.1)

## 2016-06-24 LAB — CBC WITH DIFFERENTIAL/PLATELET
Basophils Absolute: 0 10*3/uL (ref 0.0–0.1)
Basophils Relative: 0 %
EOS PCT: 1 %
Eosinophils Absolute: 0.1 10*3/uL (ref 0.0–0.7)
HEMATOCRIT: 40.7 % (ref 36.0–46.0)
Hemoglobin: 13.7 g/dL (ref 12.0–15.0)
LYMPHS ABS: 1.8 10*3/uL (ref 0.7–4.0)
Lymphocytes Relative: 25 %
MCH: 32.2 pg (ref 26.0–34.0)
MCHC: 33.7 g/dL (ref 30.0–36.0)
MCV: 95.8 fL (ref 78.0–100.0)
Monocytes Absolute: 0.8 10*3/uL (ref 0.1–1.0)
Monocytes Relative: 11 %
Neutro Abs: 4.4 10*3/uL (ref 1.7–7.7)
Neutrophils Relative %: 63 %
Platelets: 179 10*3/uL (ref 150–400)
RBC: 4.25 MIL/uL (ref 3.87–5.11)
RDW: 15.4 % (ref 11.5–15.5)
WBC: 7.1 10*3/uL (ref 4.0–10.5)

## 2016-06-24 LAB — URINALYSIS, ROUTINE W REFLEX MICROSCOPIC
Bilirubin Urine: NEGATIVE
GLUCOSE, UA: NEGATIVE mg/dL
Hgb urine dipstick: NEGATIVE
KETONES UR: NEGATIVE mg/dL
Nitrite: NEGATIVE
PROTEIN: NEGATIVE mg/dL
Specific Gravity, Urine: 1.01 (ref 1.005–1.030)
pH: 7 (ref 5.0–8.0)

## 2016-06-24 LAB — I-STAT CG4 LACTIC ACID, ED: LACTIC ACID, VENOUS: 1.42 mmol/L (ref 0.5–1.9)

## 2016-06-24 LAB — TROPONIN I: Troponin I: <0.03 ng/mL (ref <0.03)

## 2016-06-24 LAB — LIPASE, BLOOD: LIPASE: 33 U/L (ref 11–51)

## 2016-06-24 MED ORDER — FENTANYL CITRATE (PF) 100 MCG/2ML IJ SOLN
25.0000 ug | Freq: Once | INTRAMUSCULAR | Status: AC
  Administered 2016-06-24: 25 ug via INTRAVENOUS
  Filled 2016-06-24: qty 2

## 2016-06-24 MED ORDER — IOPAMIDOL (ISOVUE-300) INJECTION 61%
75.0000 mL | Freq: Once | INTRAVENOUS | Status: AC | PRN
  Administered 2016-06-24: 75 mL via INTRAVENOUS

## 2016-06-24 MED ORDER — ONDANSETRON HCL 4 MG/2ML IJ SOLN
4.0000 mg | Freq: Once | INTRAMUSCULAR | Status: AC
  Administered 2016-06-24: 4 mg via INTRAVENOUS
  Filled 2016-06-24: qty 2

## 2016-06-24 MED ORDER — FENTANYL CITRATE (PF) 100 MCG/2ML IJ SOLN
50.0000 ug | Freq: Once | INTRAMUSCULAR | Status: AC
  Administered 2016-06-24: 50 ug via INTRAVENOUS
  Filled 2016-06-24: qty 2

## 2016-06-24 MED ORDER — POLYETHYLENE GLYCOL 3350 17 GM/SCOOP PO POWD: 17.0000 g | Freq: Two times a day (BID) | ORAL | 0 refills | Status: DC

## 2016-06-24 NOTE — ED Notes (Signed)
Patient given discharge instruction, verbalized understand. IV removed, band aid applied. Patient ambulatory out of the department.  

## 2016-06-24 NOTE — ED Provider Notes (Signed)
Patient examined at change of shift, still has mild to moderate right lower abdominal pain. She has had this for a couple of weeks but seems worse the last couple of days. Has not had a bowel movement in a couple of days. Labs and imaging overall unremarkable. I went through all of these findings with the patient. Encouraged MiraLAX twice a day and discharged home. The patient is calm, with the plan.   Noemi Chapel, MD 06/24/16 [phone]

## 2016-06-24 NOTE — ED Notes (Signed)
Patient ambulatory to restroom with assistance

## 2016-06-24 NOTE — ED Notes (Signed)
Patient is a two person assist to restroom. Patient has unsteady gait. Patient assisted back to bed. Rails up with stretcher in lowest postion.

## 2016-06-24 NOTE — Discharge Instructions (Signed)

## 2016-06-24 NOTE — ED Triage Notes (Signed)
Pt arrived by EMS from home. Pt states abdominal for over a week. Pain got real bad tonight. Denies any N/V/D.

## 2016-06-24 NOTE — ED Provider Notes (Signed)
[facility] DEPT Provider Note   CSN: [id number] Arrival date & time: 06/24/16  0402     History   Chief Complaint Chief Complaint  Patient presents with  . Abdominal Pain    HPI Kimberly [name] is a 83 y.o. female.  The history is provided by the patient.  Abdominal Pain   This is a new problem. The current episode started more than 1 week ago. The problem occurs daily. The problem has been gradually worsening. The pain is located in the generalized abdominal region. The pain is moderate. Associated symptoms include nausea and constipation. Pertinent negatives include fever, diarrhea, vomiting and dysuria. The symptoms are aggravated by certain positions. Nothing relieves the symptoms.  Patient with h/o atrial fibrillation, heart block with pacemaker in place presents with generalized abdominal pain for several weeks but acutely worsened tonight She endorses constipation and also hard stool No fever/vomiting She takes eliquis for anticoagulation   She has had multiple abdominal surgeries (appendectomy, cholecystectomy, hysterectomy)  Past Medical History:  Diagnosis Date  . Acute blood loss anemia 10/11/2012  . Acute diverticulitis 08/24/2013  . Acute on chronic combined systolic and diastolic CHF, NYHA class 4 ([place]) 11/15/2013  . Antral ulcer 10/11/2012  . Arrhythmia    atrial fibb  . Atrial fibrillation ([place])   . Cardiomyopathy, nonischemic ([place])   . Chronic anticoagulation 10/12/2012  . CKD (chronic kidney disease) stage 3, GFR 30-59 ml/min 10/12/2012  . Depression   . Erosive esophagitis 10/11/2012  . Fibromyalgia   . Glaucoma   . H/O echocardiogram 2007   EF 40-45%,         . Hypertension   . Osteoarthritis   . Pacemaker    Last saw cards 07/2013  . Scoliosis     Patient Active Problem List   Diagnosis Date Noted  . Pacemaker 03/21/2015  . Chest pain 11/28/2014  . Non-ischemic cardiomyopathy ([place]) 11/28/2014  . Elevated troponin I level 11/28/2014  . Rectal  bleeding 06/14/2014  . PUD (peptic ulcer disease) 06/14/2014  . History of bipolar disorder   . Hypokalemia 05/26/2014  . Fall at home 05/26/2014  . Acute on chronic renal failure ([place]) 05/26/2014  . Chronic diastolic heart failure ([place]) 11/15/2013  . Complete heart block ([place]) 11/15/2013  . Colonic diverticular abscess 09/07/2013  . Acute gouty arthritis 09/05/2013  . Inability to ambulate due to ankle or foot 09/05/2013  . Unspecified constipation 08/26/2013  . Anemia 08/25/2013  . Acute diverticulitis 08/24/2013  . CKD (chronic kidney disease) stage 3, GFR 30-59 ml/min 10/12/2012  . Chronic anticoagulation 10/12/2012  . Dizziness 10/11/2012  . Chest pain at rest 12/14/2011  . Osteoarthritis   . Permanent atrial fibrillation ([place])   . Biventricular cardiac pacemaker - [employer] Consulta   . Fibromyalgia   . Essential hypertension   . Depression     Past Surgical History:  Procedure Laterality Date  . ABDOMINAL HYSTERECTOMY    . APPENDECTOMY    . BACK SURGERY    . BREAST SURGERY    . CARDIAC CATHETERIZATION  12/08/2005   LAD AND LEFT MAIN WITH NO HIGH-GRADE STENOSIS. MILD DISEASE IN THE CX AND LAD SYSTEM. SEVERE LV DYSFUNCTION WITH DILATION OF THE LV. EF 15-20%. LV END-DIASTOLIC PRESSURE IS 90. +1 MR.  . CHOLECYSTECTOMY    . CYSTOSCOPY N/A 02/24/2013   Procedure: CYSTOSCOPY WITH URETHRAL DILITATION;  Surgeon: Marissa Nestle, MD;  Location: [facility] ORS;  Service: Urology;  Laterality: N/A;  . DOPPLER ECHOCARDIOGRAPHY N/A 05/30/2010  LV SIZE IS NORMAL. LV SYSTOLIC FUNCTION IS LOW NORMAL. EF=50-55%. MILD INFERIOR HYPOKINESIS.MILD TO MODERATE POSTERIOR WALL HYPOKINESIS.PACEMAKER LEAD IN THE RV. LA IS MILDLY DILATED. RA IS MODERATE TO SEVERLY DILATED. PACEMAKER LEAD IN THE RA. MILD CALCICICATION OF THE MV APPARATUS. MODERATE MR. MILD TO MODERATE TR. MILD PHTN.AV MILDLY SCLEROTIC.  [name] ESOPHAGOGASTRODUODENOSCOPY N/A 10/13/2012   Dr. Gala Romney: severe ulcerative reflux esophagitis, question of  Barrett's but negative path, single deep prepyloric antral ulcer, negative H.pylori  . HERNIA REPAIR     right inguinal hernia and umbilical  . LOWETR EXT VENOUS Bilateral 11-08-10   R & L- NO EVIDENCE OF THROMBUS OR THROMBOPHLEBITIS. THERE IS MILD AMOUNT OF SUBCUTANEOUS EDEMA NOTED WITHIN THE LEFT CALF AND ANKLE. R & L GSV AND SSV- NO VENOUS INSUFF NOTED.  [name] NECK SURGERY    . NUCLEAR STRESS TEST N/A 02/13/2009   NORMAL PATTERN OF PERFUSION IN ALL REGIONS. POST STRESS VENTICULAR SIZE IS NORMAL. POST  STESS EF 85%.  NORMAL MYOCARDIAL PERFUSION STUDY.  [name] PACEMAKER INSERTION    . TONSILLECTOMY    . YAG LASER APPLICATION Bilateral 0/17/4944   Procedure: YAG LASER APPLICATION;  Surgeon: Williams Che, MD;  Location: [facility];  Service: Ophthalmology;  Laterality: Bilateral;    OB History    Gravida Para Term Preterm AB Living   5 2 1 1 3 2    SAB TAB Ectopic Multiple Live Births   3               Home Medications    Prior to Admission medications   Medication Sig Start Date End Date Taking? Authorizing Provider  carvedilol (COREG) 6.25 MG tablet Take 1 tablet (6.25 mg total) by mouth 2 (two) times daily with a meal. 05/07/16  Yes Mihai Croitoru, MD  ELIQUIS 2.5 MG TABS tablet TAKE 1 TABLET BY MOUTH  TWICE A DAY 03/18/16  Yes Mihai Croitoru, MD  furosemide (LASIX) 20 MG tablet Take 1 tablet (20 mg total) by mouth daily. 04/07/16  Yes Sanda Klein, MD    Family History Family History  Problem Relation Age of Onset  . Cancer Sister   . Asthma Sister   . Heart failure Brother   . Colon cancer Neg Hx     Social History Social History  Substance Use Topics  . Smoking status: Former Smoker    Packs/day: 0.50    Years: 30.00    Types: Cigarettes    Quit date: 12/13/2004  . Smokeless tobacco: Never Used  . Alcohol use 0.0 oz/week     Comment: history of drinking a 1/2 glass of wine in the evening     Allergies   Penicillins and Sulfa antibiotics   Review of Systems Review of  Systems  Constitutional: Negative for fever.  Cardiovascular: Negative for chest pain.  Gastrointestinal: Positive for abdominal pain, constipation and nausea. Negative for diarrhea and vomiting.  Genitourinary: Negative for dysuria.  Musculoskeletal: Positive for back pain.       Chronic back pain   All other systems reviewed and are negative.    Physical Exam Updated Vital Signs BP 135/67 (BP Location: Right Arm)   Pulse 74   Temp 97.7 F (36.5 C) (Oral)   Resp 18   Ht 5\' 5"  (1.651 m)   Wt 65.8 kg   SpO2 99%   BMI 24.13 kg/m   Physical Exam CONSTITUTIONAL: elderly, no acute distress HEAD: Normocephalic/atraumatic EYES: EOMI, no icterus ENMT: Mucous membranes moist NECK: supple no meningeal  signs SPINE/BACK:entire spine nontender CV:  no murmurs/rubs/gallops noted LUNGS: scattered wheeze bilaterally, no apparent distress ABDOMEN: soft, moderate diffuse tenderness, decreased BS noted no rebound or guarding GU:no cva tenderness NEURO: Pt is awake/alert/appropriate, moves all extremitiesx4.  No facial droop.   EXTREMITIES: pulses normal/equal, full ROM SKIN: warm, color normal PSYCH: no abnormalities of mood noted, alert and oriented to situation   ED Treatments / Results  Labs (all labs ordered are listed, but only abnormal results are displayed) Labs Reviewed  COMPREHENSIVE METABOLIC PANEL - Abnormal; Notable for the following:       Result Value   Potassium 3.4 (*)    Creatinine, Ser 1.39 (*)    Calcium 8.8 (*)    ALT 12 (*)    GFR calc non Af Amer 33 (*)    GFR calc Af Amer 38 (*)    All other components within normal limits  URINALYSIS, ROUTINE W REFLEX MICROSCOPIC - Abnormal; Notable for the following:    Leukocytes, UA TRACE (*)    Bacteria, UA RARE (*)    All other components within normal limits  CBC WITH DIFFERENTIAL/PLATELET  LIPASE, BLOOD  TROPONIN I  I-STAT CG4 LACTIC ACID, ED    EKG  EKG Interpretation  Date/Time:  Tuesday June 24 2016 04:21:30 EST Ventricular Rate:  72 PR Interval:    QRS Duration: 153 QT Interval:  550 QTC Calculation: 602 R Axis:   -46 Text Interpretation:  Ventricular-paced rhythm No further analysis attempted due to paced rhythm Baseline wander in lead(s) I V1 No significant change since last tracing Confirmed by Christy Gentles  MD, [place] ([id number]) on 06/24/2016 4:24:32 AM       Radiology Ct Abdomen Pelvis W Contrast  Result Date: 06/24/2016 CLINICAL DATA:  83 year old hypertensive female with abdominal pain for 2 weeks worse this evening. Post appendectomy and cholecystectomy. Subsequent encounter. EXAM: CT ABDOMEN AND PELVIS WITH CONTRAST TECHNIQUE: Multidetector CT imaging of the abdomen and pelvis was performed using the standard protocol following bolus administration of intravenous contrast. CONTRAST:  46mL ISOVUE-300 IOPAMIDOL (ISOVUE-300) INJECTION 61% COMPARISON:  06/24/2016 abdominal series.  11/01/2013 CT. FINDINGS: Lower chest: Lung bases clear. Biventricular pacer in place with enlarge right atrium. Prominence of inferior vena cava. There may be increased right heart pressure. Appearance unchanged. Coronary artery calcifications. Hepatobiliary: Hepatic cysts once again noted without worrisome hepatic lesion detected. Postcholecystectomy. Pancreas: No worrisome pancreatic mass or inflammation. Spleen: No worrisome splenic lesion or enlargement. Adrenals/Urinary Tract: No renal or ureteral obstructing stone or evidence hydronephrosis. No renal or adrenal mass. Noncontrast filled views of the urinary bladder unremarkable. Stomach/Bowel: Moderately large hiatal hernia. Prominent diverticulosis changes most notable sigmoid colon (with associated muscular hypertrophy) but without evidence of extraluminal bowel inflammatory process or free intraperitoneal air. Vascular/Lymphatic: Atherosclerotic changes aorta with ectasia without aneurysmal dilation or large vessel occlusion. No adenopathy. Reproductive:  Post hysterectomy. Other: No abnormal fluid collection. Musculoskeletal: Prominent scoliosis. Remote loss height T12 superior endplate without acute compression fracture. Bilateral hip joint degenerative changes. IMPRESSION: Prominent diverticulosis changes most notable sigmoid colon without evidence of extraluminal bowel inflammatory process or free intraperitoneal air. Moderately large hiatal hernia. Biventricular pacer in place with enlarge right atrium. Prominence of inferior vena cava. There may be increased right heart pressure. Appearance unchanged. Coronary artery calcifications Aortic atherosclerosis. Scoliosis with superimposed degenerative changes. Electronically Signed   By: Genia Del M.D.   On: 06/24/2016 07:22   Dg Abdomen Acute W/chest  Result Date: 06/24/2016 CLINICAL DATA:  Abdominal pain EXAM:  DG ABDOMEN ACUTE W/ 1V CHEST COMPARISON:  Chest radiograph 07/01/2015 FINDINGS: Left chest wall 3 lead AICD is unchanged. There is aortic atherosclerosis. Unchanged cardiomediastinal contours. No pleural effusion or pneumothorax. There is biapical scarring. No free intraperitoneal air. Nonobstructive bowel gas pattern. Marked lumbar dextroscoliosis. IMPRESSION: No radiographic evidence of small-bowel obstruction. No acute cardiopulmonary disease. Electronically Signed   By: Ulyses Jarred M.D.   On: 06/24/2016 06:11    Procedures Procedures (including critical care time)  Medications Ordered in ED Medications  ondansetron (ZOFRAN) injection 4 mg (not administered)  fentaNYL (SUBLIMAZE) injection 50 mcg (not administered)  ondansetron (ZOFRAN) injection 4 mg (4 mg Intravenous Given 06/24/16 0459)  fentaNYL (SUBLIMAZE) injection 50 mcg (50 mcg Intravenous Given 06/24/16 0459)  fentaNYL (SUBLIMAZE) injection 25 mcg (25 mcg Intravenous Given 06/24/16 0610)  iopamidol (ISOVUE-300) 61 % injection 75 mL (75 mLs Intravenous Contrast Given 06/24/16 0643)     Initial Impression / Assessment and Plan  / ED Course  I have reviewed the triage vital signs and the nursing notes.  Pertinent labs & imaging results that were available during my care of the patient were reviewed by me and considered in my medical decision making (see chart for details).     7:35 AM Pt stable She still has diffuse abdominal tendernss, even with negative CT scan At signout to dr Sabra Heck, f/u on lactate as well as response to meds If no improvement in pain/nausea, she will need admission   Final Clinical Impressions(s) / ED Diagnoses   Final diagnoses:  Generalized abdominal pain    New Prescriptions New Prescriptions   No medications on file     Ripley Fraise, MD 06/24/16 [phone]

## 2016-06-24 NOTE — ED Notes (Signed)
Pt states abdominal pain for a few weeks that got worse tonight. No N/V/D. Says she had a bowel movement yesterday & it was hard.

## 2016-06-24 NOTE — ED Notes (Signed)
Patient ambulatory to restroom with assistance.  Patient uses walker at home.

## 2016-09-09 ENCOUNTER — Telehealth: Payer: Self-pay | Admitting: Cardiology

## 2016-09-09 ENCOUNTER — Ambulatory Visit (INDEPENDENT_AMBULATORY_CARE_PROVIDER_SITE_OTHER): Payer: Medicare Other | Admitting: *Deleted

## 2016-09-09 DIAGNOSIS — I442 Atrioventricular block, complete: Secondary | ICD-10-CM | POA: Diagnosis not present

## 2016-09-09 NOTE — Progress Notes (Signed)
Remote pacemaker transmission.   

## 2016-09-09 NOTE — Telephone Encounter (Signed)
Spoke with pt and reminded pt of remote transmission that is due today. Pt verbalized understanding.   

## 2016-09-11 LAB — CUP PACEART REMOTE DEVICE CHECK
Battery Remaining Longevity: 40 mo
Battery Voltage: 2.96 V
Brady Statistic AP VP Percent: 0 %
Brady Statistic AS VP Percent: 97.67 %
Brady Statistic RV Percent Paced: 98.84 %
Date Time Interrogation Session: 20180509003048
Implantable Lead Implant Date: 19971124
Implantable Lead Location: 753860
Implantable Lead Model: 4024
Lead Channel Impedance Value: 4047 Ohm
Lead Channel Impedance Value: 4047 Ohm
Lead Channel Impedance Value: 513 Ohm
Lead Channel Impedance Value: 570 Ohm
Lead Channel Impedance Value: 608 Ohm
Lead Channel Impedance Value: 779 Ohm
Lead Channel Pacing Threshold Amplitude: 0.625 V
Lead Channel Pacing Threshold Pulse Width: 0.4 ms
Lead Channel Setting Pacing Pulse Width: 1 ms
MDC IDC LEAD IMPLANT DT: 20070831
MDC IDC LEAD LOCATION: 753858
MDC IDC MSMT LEADCHNL LV IMPEDANCE VALUE: 1007 Ohm
MDC IDC MSMT LEADCHNL LV IMPEDANCE VALUE: 741 Ohm
MDC IDC MSMT LEADCHNL LV PACING THRESHOLD AMPLITUDE: 1 V
MDC IDC MSMT LEADCHNL LV PACING THRESHOLD PULSEWIDTH: 1 ms
MDC IDC MSMT LEADCHNL RV IMPEDANCE VALUE: 646 Ohm
MDC IDC MSMT LEADCHNL RV SENSING INTR AMPL: 11.625 mV
MDC IDC MSMT LEADCHNL RV SENSING INTR AMPL: 11.625 mV
MDC IDC PG IMPLANT DT: 20120619
MDC IDC SET LEADCHNL LV PACING AMPLITUDE: 2 V
MDC IDC SET LEADCHNL RV PACING AMPLITUDE: 2 V
MDC IDC SET LEADCHNL RV PACING PULSEWIDTH: 0.4 ms
MDC IDC SET LEADCHNL RV SENSING SENSITIVITY: 2.8 mV
MDC IDC STAT BRADY AP VS PERCENT: 0 %
MDC IDC STAT BRADY AS VS PERCENT: 2.33 %
MDC IDC STAT BRADY RA PERCENT PACED: 0 %

## 2016-09-27 ENCOUNTER — Other Ambulatory Visit: Payer: Self-pay | Admitting: Cardiovascular Disease

## 2016-11-03 ENCOUNTER — Other Ambulatory Visit: Payer: Self-pay | Admitting: Cardiovascular Disease

## 2016-11-03 NOTE — Telephone Encounter (Signed)
Rx(s) sent to pharmacy electronically.  

## 2016-11-11 DIAGNOSIS — H353132 Nonexudative age-related macular degeneration, bilateral, intermediate dry stage: Secondary | ICD-10-CM | POA: Diagnosis not present

## 2016-11-11 DIAGNOSIS — H01021 Squamous blepharitis right upper eyelid: Secondary | ICD-10-CM | POA: Diagnosis not present

## 2016-11-11 DIAGNOSIS — H01022 Squamous blepharitis right lower eyelid: Secondary | ICD-10-CM | POA: Diagnosis not present

## 2016-11-11 DIAGNOSIS — H04123 Dry eye syndrome of bilateral lacrimal glands: Secondary | ICD-10-CM | POA: Diagnosis not present

## 2016-11-11 DIAGNOSIS — H01024 Squamous blepharitis left upper eyelid: Secondary | ICD-10-CM | POA: Diagnosis not present

## 2016-12-09 ENCOUNTER — Telehealth: Payer: Self-pay | Admitting: Cardiology

## 2016-12-09 ENCOUNTER — Encounter: Payer: Medicare Other | Admitting: *Deleted

## 2016-12-09 NOTE — Telephone Encounter (Signed)
Spoke with pt and reminded pt of remote transmission that is due today. Pt verbalized understanding.   

## 2016-12-10 ENCOUNTER — Encounter: Payer: Self-pay | Admitting: Cardiology

## 2016-12-15 ENCOUNTER — Telehealth: Payer: Self-pay | Admitting: Cardiovascular Disease

## 2016-12-15 NOTE — Telephone Encounter (Signed)
Using monitor with landline- transmission will not send using landline after 10/08/16. Ordering [id number] monitor, confirmed address.

## 2016-12-15 NOTE — Telephone Encounter (Signed)
New message     Pt can not get transmission to go through

## 2016-12-22 ENCOUNTER — Ambulatory Visit (INDEPENDENT_AMBULATORY_CARE_PROVIDER_SITE_OTHER): Payer: Medicare Other | Admitting: *Deleted

## 2016-12-22 DIAGNOSIS — I442 Atrioventricular block, complete: Secondary | ICD-10-CM | POA: Diagnosis not present

## 2016-12-23 NOTE — Progress Notes (Signed)
Remote pacemaker transmission.   

## 2016-12-24 LAB — CUP PACEART REMOTE DEVICE CHECK
Battery Voltage: 2.95 V
Brady Statistic RA Percent Paced: 0 %
Brady Statistic RV Percent Paced: 98.97 %
Implantable Lead Implant Date: 19971124
Implantable Lead Location: 753858
Implantable Lead Model: 4024
Implantable Lead Model: 4194
Lead Channel Impedance Value: 4047 Ohm
Lead Channel Impedance Value: 4047 Ohm
Lead Channel Impedance Value: 494 Ohm
Lead Channel Impedance Value: 532 Ohm
Lead Channel Impedance Value: 646 Ohm
Lead Channel Impedance Value: 950 Ohm
Lead Channel Pacing Threshold Pulse Width: 0.4 ms
Lead Channel Pacing Threshold Pulse Width: 1 ms
Lead Channel Sensing Intrinsic Amplitude: 11.625 mV
Lead Channel Sensing Intrinsic Amplitude: 11.625 mV
Lead Channel Setting Pacing Amplitude: 2.5 V
MDC IDC LEAD IMPLANT DT: 20070831
MDC IDC LEAD LOCATION: 753860
MDC IDC MSMT BATTERY REMAINING LONGEVITY: 36 mo
MDC IDC MSMT LEADCHNL LV IMPEDANCE VALUE: 703 Ohm
MDC IDC MSMT LEADCHNL LV IMPEDANCE VALUE: 741 Ohm
MDC IDC MSMT LEADCHNL LV PACING THRESHOLD AMPLITUDE: 1.375 V
MDC IDC MSMT LEADCHNL RV IMPEDANCE VALUE: 608 Ohm
MDC IDC MSMT LEADCHNL RV PACING THRESHOLD AMPLITUDE: 0.5 V
MDC IDC PG IMPLANT DT: 20120619
MDC IDC SESS DTM: 20180820201104
MDC IDC SET LEADCHNL LV PACING PULSEWIDTH: 1 ms
MDC IDC SET LEADCHNL RV PACING AMPLITUDE: 2 V
MDC IDC SET LEADCHNL RV PACING PULSEWIDTH: 0.4 ms
MDC IDC SET LEADCHNL RV SENSING SENSITIVITY: 2.8 mV
MDC IDC STAT BRADY AP VP PERCENT: 0 %
MDC IDC STAT BRADY AP VS PERCENT: 0 %
MDC IDC STAT BRADY AS VP PERCENT: 97.83 %
MDC IDC STAT BRADY AS VS PERCENT: 2.17 %

## 2017-01-02 ENCOUNTER — Encounter: Payer: Self-pay | Admitting: Cardiology

## 2017-01-13 ENCOUNTER — Other Ambulatory Visit: Payer: Self-pay

## 2017-01-13 MED ORDER — CARVEDILOL 6.25 MG PO TABS: ORAL_TABLET | ORAL | 0 refills | Status: DC

## 2017-02-09 ENCOUNTER — Telehealth: Payer: Self-pay | Admitting: Cardiovascular Disease

## 2017-02-09 NOTE — Telephone Encounter (Signed)
Spoke with pt she states that she never get headaches and has nothing to take for them. Recommended tylenol @6 -8 hours. She states that she does have a BP cuff and will start taking her BP in the morning for the rest of the week, to see if it is running high and encouraged to increase hydration.

## 2017-02-09 NOTE — Telephone Encounter (Signed)
New message      What can patient take for headache?

## 2017-02-23 ENCOUNTER — Telehealth: Payer: Self-pay | Admitting: Cardiovascular Disease

## 2017-02-23 NOTE — Telephone Encounter (Signed)
Kimberly [name] is calling wanting to know if ultrasonic pest repeller will work with her pacer maker . Please Call

## 2017-02-23 NOTE — Telephone Encounter (Signed)
Spoke with Kimberly [name] the ultrasonic pest repellent is a device you plug up and it emits a sound that pests do not like, informed Kimberly [name] that this was ok to have. She voiced understanding.

## 2017-02-26 ENCOUNTER — Encounter: Payer: Self-pay | Admitting: Family Medicine

## 2017-02-26 ENCOUNTER — Ambulatory Visit (INDEPENDENT_AMBULATORY_CARE_PROVIDER_SITE_OTHER): Payer: Medicare Other | Admitting: Family Medicine

## 2017-02-26 VITALS — BP 138/77 | HR 74 | Temp 97.8°F | Ht 65.0 in | Wt 149.0 lb

## 2017-02-26 DIAGNOSIS — Z0000 Encounter for general adult medical examination without abnormal findings: Secondary | ICD-10-CM | POA: Diagnosis not present

## 2017-02-26 DIAGNOSIS — I5032 Chronic diastolic (congestive) heart failure: Secondary | ICD-10-CM | POA: Diagnosis not present

## 2017-02-26 DIAGNOSIS — N183 Chronic kidney disease, stage 3 unspecified: Secondary | ICD-10-CM

## 2017-02-26 DIAGNOSIS — I10 Essential (primary) hypertension: Secondary | ICD-10-CM

## 2017-02-26 DIAGNOSIS — Z23 Encounter for immunization: Secondary | ICD-10-CM

## 2017-02-26 DIAGNOSIS — Z7901 Long term (current) use of anticoagulants: Secondary | ICD-10-CM

## 2017-02-26 NOTE — Addendum Note (Signed)
Addended by: Marylin Crosby on: 02/26/2017 05:00 PM   Modules accepted: Orders

## 2017-02-26 NOTE — Progress Notes (Signed)
BP 138/77   Pulse 74   Temp 97.8 F (36.6 C) (Oral)   Ht _0  (1.651 m)   Wt 149 lb (67.6 kg)   BMI 24.79 kg/m    Subjective:    Patient ID: Kimberly [name], female    DOB: June 12, 1926, 83 y.o.   MRN: [id number]  HPI: Kimberly [name] is a 83 y.o. female presenting on 02/26/2017 for Annual Exam (pt here today establishing care with Dr Lotus Gover from Dr Sabra Heck)   HPI Patient is coming in for annual physical and recheck of her chronic diseases.  She has chronic kidney disease and heart failure and has a cardiologist that she sees regularly.  She has a pacemaker which they monitor as well.  She also has hypertension.  She is currently not on very many medications and is only on Coreg and Eliquis and Lasix.  She denies any major shortness of breath or cough or chest congestion.  She denies any swelling and has relatively been doing very well. Patient denies any chest pain, shortness of breath, headaches or vision issues, abdominal complaints, diarrhea, nausea, vomiting, or joint issues.   Relevant past medical, surgical, family and social history reviewed and updated as indicated. Interim medical history since our last visit reviewed. Allergies and medications reviewed and updated.  Review of Systems  Constitutional: Negative for chills and fever.  HENT: Negative for congestion, ear discharge, ear pain and tinnitus.   Eyes: Negative for pain, redness and visual disturbance.  Respiratory: Negative for cough, chest tightness, shortness of breath and wheezing.   Cardiovascular: Negative for chest pain, palpitations and leg swelling.  Gastrointestinal: Negative for abdominal pain, blood in stool, constipation and diarrhea.  Genitourinary: Negative for difficulty urinating, dysuria and hematuria.  Musculoskeletal: Negative for back pain, gait problem and myalgias.  Skin: Negative for rash.  Neurological: Negative for dizziness, weakness, light-headedness and headaches.  Psychiatric/Behavioral:  Negative for agitation, behavioral problems, decreased concentration, dysphoric mood, self-injury, sleep disturbance and suicidal ideas. The patient is not nervous/anxious.   All other systems reviewed and are negative.   Per HPI unless specifically indicated above   Allergies as of 02/26/2017      Reactions   Penicillins Hives   Has patient had a PCN reaction causing immediate rash, facial/tongue/throat swelling, SOB or lightheadedness with hypotension: Yes Has patient had a PCN reaction causing severe rash involving mucus membranes or skin necrosis: No Has patient had a PCN reaction that required hospitalization No Has patient had a PCN reaction occurring within the last 10 years: Yes If all of the above answers are "NO", then may proceed with Cephalosporin use.   Sulfa Antibiotics Rash      Medication List       Accurate as of 02/26/17  2:39 PM. Always use your most recent med list.          carvedilol 6.25 MG tablet Commonly known as:  COREG TAKE 1 TABLET BY MOUTH TWO  TIMES DAILY WITH A MEAL   ELIQUIS 2.5 MG Tabs tablet Generic drug:  apixaban TAKE 1 TABLET BY MOUTH  TWICE A DAY   furosemide 20 MG tablet Commonly known as:  LASIX Take 1 tablet (20 mg total) by mouth daily.   polyethylene glycol powder powder Commonly known as:  GLYCOLAX/MIRALAX Take 17 g by mouth 2 (two) times daily. Until daily soft stools  OTC          Objective:    BP 138/77   Pulse 74  Temp 97.8 F (36.6 C) (Oral)   Ht _0  (1.651 m)   Wt 149 lb (67.6 kg)   BMI 24.79 kg/m   Wt Readings from Last 3 Encounters:  02/26/17 149 lb (67.6 kg)  06/24/16 145 lb (65.8 kg)  03/11/16 146 lb 9.6 oz (66.5 kg)    Physical Exam  Constitutional: She is oriented to person, place, and time. She appears well-developed and well-nourished. No distress.  Eyes: Conjunctivae are normal.  Neck: Neck supple. No thyromegaly present.  Cardiovascular: Normal rate, regular rhythm, normal heart sounds and  intact distal pulses.   No murmur heard. Pulmonary/Chest: Effort normal and breath sounds normal. No respiratory distress. She has no decreased breath sounds. She has no wheezes. She has no rales.  Abdominal: Soft. Bowel sounds are normal. She exhibits no distension. There is no tenderness. There is no rebound and no guarding.  Musculoskeletal: Normal range of motion. She exhibits no edema or tenderness.  Lymphadenopathy:    She has no cervical adenopathy.  Neurological: She is alert and oriented to person, place, and time. Coordination normal.  Skin: Skin is warm and dry. No rash noted. She is not diaphoretic.  Psychiatric: She has a normal mood and affect. Her behavior is normal.  Nursing note and vitals reviewed.       Assessment & Plan:   Problem List Items Addressed This Visit      Cardiovascular and Mediastinum   Essential hypertension   Relevant Orders   CMP14+EGFR   CBC with Differential/Platelet   Lipid panel   Chronic diastolic heart failure (HCC)   Relevant Orders   CBC with Differential/Platelet   Lipid panel     Genitourinary   CKD (chronic kidney disease) stage 3, GFR 30-59 ml/min (HCC)   Relevant Orders   CMP14+EGFR   CBC with Differential/Platelet     Other   Chronic anticoagulation    Other Visit Diagnoses    Physical exam    -  Primary       Follow up plan: Return in about 1 year (around 02/26/2018), or if symptoms worsen or fail to improve.  Counseling provided for all of the vaccine components Orders Placed This Encounter  Procedures  . CMP14+EGFR  . CBC with Differential/Platelet  . Lipid panel    Caryl Pina, MD [facility] 02/26/2017, 2:39 PM

## 2017-02-27 ENCOUNTER — Telehealth: Payer: Self-pay | Admitting: Family Medicine

## 2017-02-27 LAB — CBC WITH DIFFERENTIAL/PLATELET
BASOS ABS: 0 10*3/uL (ref 0.0–0.2)
Basos: 0 %
EOS (ABSOLUTE): 0.1 10*3/uL (ref 0.0–0.4)
Eos: 2 %
HEMOGLOBIN: 13.3 g/dL (ref 11.1–15.9)
Hematocrit: 39.2 % (ref 34.0–46.6)
IMMATURE GRANS (ABS): 0 10*3/uL (ref 0.0–0.1)
IMMATURE GRANULOCYTES: 0 %
LYMPHS: 22 %
Lymphocytes Absolute: 1.5 10*3/uL (ref 0.7–3.1)
MCH: 34.5 pg — ABNORMAL HIGH (ref 26.6–33.0)
MCHC: 33.9 g/dL (ref 31.5–35.7)
MCV: 102 fL — ABNORMAL HIGH (ref 79–97)
MONOCYTES: 7 %
Monocytes Absolute: 0.5 10*3/uL (ref 0.1–0.9)
Neutrophils Absolute: 4.8 10*3/uL (ref 1.4–7.0)
Neutrophils: 69 %
Platelets: 186 10*3/uL (ref 150–379)
RBC: 3.86 x10E6/uL (ref 3.77–5.28)
RDW: 14.8 % (ref 12.3–15.4)
WBC: 6.8 10*3/uL (ref 3.4–10.8)

## 2017-02-27 LAB — CMP14+EGFR
ALT: 10 IU/L (ref 0–32)
AST: 20 IU/L (ref 0–40)
Albumin/Globulin Ratio: 1.8 (ref 1.2–2.2)
Albumin: 4.3 g/dL (ref 3.5–4.7)
Alkaline Phosphatase: 57 IU/L (ref 39–117)
BILIRUBIN TOTAL: 0.5 mg/dL (ref 0.0–1.2)
BUN/Creatinine Ratio: 14 (ref 12–28)
BUN: 20 mg/dL (ref 8–27)
CHLORIDE: 104 mmol/L (ref 96–106)
CO2: 23 mmol/L (ref 20–29)
CREATININE: 1.38 mg/dL — AB (ref 0.57–1.00)
Calcium: 9.3 mg/dL (ref 8.7–10.3)
GFR calc Af Amer: 39 mL/min/{1.73_m2} — ABNORMAL LOW (ref >59)
GFR calc non Af Amer: 34 mL/min/{1.73_m2} — ABNORMAL LOW (ref >59)
GLUCOSE: 89 mg/dL (ref 65–99)
Globulin, Total: 2.4 g/dL (ref 1.5–4.5)
Potassium: 4.2 mmol/L (ref 3.5–5.2)
Sodium: 141 mmol/L (ref 134–144)
TOTAL PROTEIN: 6.7 g/dL (ref 6.0–8.5)

## 2017-02-27 LAB — LIPID PANEL
CHOL/HDL RATIO: 2.3 ratio (ref 0.0–4.4)
CHOLESTEROL TOTAL: 154 mg/dL (ref 100–199)
HDL: 66 mg/dL (ref >39)
LDL CALC: 74 mg/dL (ref 0–99)
TRIGLYCERIDES: 72 mg/dL (ref 0–149)
VLDL CHOLESTEROL CAL: 14 mg/dL (ref 5–40)

## 2017-02-27 NOTE — Telephone Encounter (Signed)
Tell her to try over-the-counter melatonin 5 mg and take it about 90 minutes before bedtime and if not working then follow-up with her PCP

## 2017-02-27 NOTE — Telephone Encounter (Signed)
States she forgot to tell Dettinger that she had a lot more complaints yesterday at her visit. Advised patient she needs to come back in to see provider to talk about in an office visit- patient aggred. Asked if he would send her in something to help her sleep at night and if not asking what he recommends OTC. Please advise

## 2017-02-27 NOTE — Telephone Encounter (Signed)
Patient aware of recommendations.  

## 2017-02-28 ENCOUNTER — Encounter (HOSPITAL_COMMUNITY): Payer: Self-pay | Admitting: Emergency Medicine

## 2017-02-28 ENCOUNTER — Observation Stay (HOSPITAL_COMMUNITY)
Admission: EM | Admit: 2017-02-28 | Discharge: 2017-03-05 | Disposition: A | Discharge status: Home / Self Care | Payer: Medicare Other | Attending: Family Medicine | Admitting: Family Medicine

## 2017-02-28 DIAGNOSIS — K5730 Diverticulosis of large intestine without perforation or abscess without bleeding: Secondary | ICD-10-CM | POA: Insufficient documentation

## 2017-02-28 DIAGNOSIS — M797 Fibromyalgia: Secondary | ICD-10-CM | POA: Diagnosis present

## 2017-02-28 DIAGNOSIS — K2980 Duodenitis without bleeding: Secondary | ICD-10-CM | POA: Diagnosis not present

## 2017-02-28 DIAGNOSIS — I442 Atrioventricular block, complete: Secondary | ICD-10-CM | POA: Diagnosis not present

## 2017-02-28 DIAGNOSIS — R600 Localized edema: Secondary | ICD-10-CM | POA: Diagnosis not present

## 2017-02-28 DIAGNOSIS — R1030 Lower abdominal pain, unspecified: Secondary | ICD-10-CM | POA: Diagnosis not present

## 2017-02-28 DIAGNOSIS — Z66 Do not resuscitate: Secondary | ICD-10-CM | POA: Insufficient documentation

## 2017-02-28 DIAGNOSIS — K449 Diaphragmatic hernia without obstruction or gangrene: Secondary | ICD-10-CM | POA: Diagnosis not present

## 2017-02-28 DIAGNOSIS — D123 Benign neoplasm of transverse colon: Secondary | ICD-10-CM | POA: Diagnosis not present

## 2017-02-28 DIAGNOSIS — R0789 Other chest pain: Secondary | ICD-10-CM | POA: Insufficient documentation

## 2017-02-28 DIAGNOSIS — I5032 Chronic diastolic (congestive) heart failure: Secondary | ICD-10-CM | POA: Diagnosis present

## 2017-02-28 DIAGNOSIS — G8929 Other chronic pain: Secondary | ICD-10-CM | POA: Insufficient documentation

## 2017-02-28 DIAGNOSIS — M79606 Pain in leg, unspecified: Secondary | ICD-10-CM | POA: Diagnosis not present

## 2017-02-28 DIAGNOSIS — I4821 Permanent atrial fibrillation: Secondary | ICD-10-CM | POA: Diagnosis present

## 2017-02-28 DIAGNOSIS — K279 Peptic ulcer, site unspecified, unspecified as acute or chronic, without hemorrhage or perforation: Secondary | ICD-10-CM | POA: Diagnosis present

## 2017-02-28 DIAGNOSIS — Z7901 Long term (current) use of anticoagulants: Secondary | ICD-10-CM

## 2017-02-28 DIAGNOSIS — K922 Gastrointestinal hemorrhage, unspecified: Secondary | ICD-10-CM | POA: Diagnosis not present

## 2017-02-28 DIAGNOSIS — R12 Heartburn: Secondary | ICD-10-CM | POA: Insufficient documentation

## 2017-02-28 DIAGNOSIS — Z950 Presence of cardiac pacemaker: Secondary | ICD-10-CM | POA: Insufficient documentation

## 2017-02-28 DIAGNOSIS — K2950 Unspecified chronic gastritis without bleeding: Secondary | ICD-10-CM | POA: Diagnosis not present

## 2017-02-28 DIAGNOSIS — I701 Atherosclerosis of renal artery: Secondary | ICD-10-CM | POA: Insufficient documentation

## 2017-02-28 DIAGNOSIS — Q439 Congenital malformation of intestine, unspecified: Secondary | ICD-10-CM | POA: Insufficient documentation

## 2017-02-28 DIAGNOSIS — F329 Major depressive disorder, single episode, unspecified: Secondary | ICD-10-CM | POA: Diagnosis not present

## 2017-02-28 DIAGNOSIS — K921 Melena: Principal | ICD-10-CM

## 2017-02-28 DIAGNOSIS — R45851 Suicidal ideations: Secondary | ICD-10-CM | POA: Insufficient documentation

## 2017-02-28 DIAGNOSIS — D122 Benign neoplasm of ascending colon: Secondary | ICD-10-CM | POA: Insufficient documentation

## 2017-02-28 DIAGNOSIS — K625 Hemorrhage of anus and rectum: Secondary | ICD-10-CM | POA: Diagnosis present

## 2017-02-28 DIAGNOSIS — Z8711 Personal history of peptic ulcer disease: Secondary | ICD-10-CM | POA: Insufficient documentation

## 2017-02-28 DIAGNOSIS — N183 Chronic kidney disease, stage 3 unspecified: Secondary | ICD-10-CM | POA: Diagnosis present

## 2017-02-28 DIAGNOSIS — R0600 Dyspnea, unspecified: Secondary | ICD-10-CM

## 2017-02-28 DIAGNOSIS — R0609 Other forms of dyspnea: Secondary | ICD-10-CM | POA: Insufficient documentation

## 2017-02-28 DIAGNOSIS — D125 Benign neoplasm of sigmoid colon: Secondary | ICD-10-CM | POA: Insufficient documentation

## 2017-02-28 DIAGNOSIS — K621 Rectal polyp: Secondary | ICD-10-CM | POA: Insufficient documentation

## 2017-02-28 DIAGNOSIS — H409 Unspecified glaucoma: Secondary | ICD-10-CM | POA: Diagnosis present

## 2017-02-28 DIAGNOSIS — K5909 Other constipation: Secondary | ICD-10-CM | POA: Insufficient documentation

## 2017-02-28 DIAGNOSIS — R410 Disorientation, unspecified: Secondary | ICD-10-CM | POA: Insufficient documentation

## 2017-02-28 DIAGNOSIS — I130 Hypertensive heart and chronic kidney disease with heart failure and stage 1 through stage 4 chronic kidney disease, or unspecified chronic kidney disease: Secondary | ICD-10-CM | POA: Diagnosis not present

## 2017-02-28 DIAGNOSIS — I10 Essential (primary) hypertension: Secondary | ICD-10-CM | POA: Diagnosis present

## 2017-02-28 DIAGNOSIS — I482 Chronic atrial fibrillation: Secondary | ICD-10-CM | POA: Insufficient documentation

## 2017-02-28 DIAGNOSIS — I428 Other cardiomyopathies: Secondary | ICD-10-CM | POA: Diagnosis not present

## 2017-02-28 DIAGNOSIS — K648 Other hemorrhoids: Secondary | ICD-10-CM | POA: Insufficient documentation

## 2017-02-28 DIAGNOSIS — Z9181 History of falling: Secondary | ICD-10-CM | POA: Insufficient documentation

## 2017-02-28 DIAGNOSIS — Z87891 Personal history of nicotine dependence: Secondary | ICD-10-CM | POA: Insufficient documentation

## 2017-02-28 DIAGNOSIS — Z79899 Other long term (current) drug therapy: Secondary | ICD-10-CM | POA: Insufficient documentation

## 2017-02-28 LAB — URINALYSIS, ROUTINE W REFLEX MICROSCOPIC
BACTERIA UA: NONE SEEN
Bilirubin Urine: NEGATIVE
GLUCOSE, UA: NEGATIVE mg/dL
Hgb urine dipstick: NEGATIVE
KETONES UR: NEGATIVE mg/dL
Nitrite: NEGATIVE
PROTEIN: NEGATIVE mg/dL
Specific Gravity, Urine: 1.006 (ref 1.005–1.030)
pH: 6 (ref 5.0–8.0)

## 2017-02-28 LAB — CBC WITH DIFFERENTIAL/PLATELET
BASOS PCT: 0 %
Basophils Absolute: 0 10*3/uL (ref 0.0–0.1)
EOS ABS: 0.1 10*3/uL (ref 0.0–0.7)
Eosinophils Relative: 3 %
HCT: 39.2 % (ref 36.0–46.0)
Hemoglobin: 13.5 g/dL (ref 12.0–15.0)
Lymphocytes Relative: 26 %
Lymphs Abs: 1.2 10*3/uL (ref 0.7–4.0)
MCH: 34.8 pg — AB (ref 26.0–34.0)
MCHC: 34.4 g/dL (ref 30.0–36.0)
MCV: 101 fL — ABNORMAL HIGH (ref 78.0–100.0)
MONO ABS: 0.3 10*3/uL (ref 0.1–1.0)
MONOS PCT: 8 %
Neutro Abs: 2.9 10*3/uL (ref 1.7–7.7)
Neutrophils Relative %: 63 %
Platelets: 153 10*3/uL (ref 150–400)
RBC: 3.88 MIL/uL (ref 3.87–5.11)
RDW: 14.2 % (ref 11.5–15.5)
WBC: 4.5 10*3/uL (ref 4.0–10.5)

## 2017-02-28 LAB — POC OCCULT BLOOD, ED: FECAL OCCULT BLD: POSITIVE — AB

## 2017-02-28 LAB — COMPREHENSIVE METABOLIC PANEL
ALBUMIN: 4 g/dL (ref 3.5–5.0)
ALT: 14 U/L (ref 14–54)
ANION GAP: 11 (ref 5–15)
AST: 24 U/L (ref 15–41)
Alkaline Phosphatase: 51 U/L (ref 38–126)
BILIRUBIN TOTAL: 0.6 mg/dL (ref 0.3–1.2)
BUN: 21 mg/dL — ABNORMAL HIGH (ref 6–20)
CO2: 23 mmol/L (ref 22–32)
Calcium: 9.1 mg/dL (ref 8.9–10.3)
Chloride: 104 mmol/L (ref 101–111)
Creatinine, Ser: 1.3 mg/dL — ABNORMAL HIGH (ref 0.44–1.00)
GFR calc non Af Amer: 36 mL/min — ABNORMAL LOW (ref >60)
GFR, EST AFRICAN AMERICAN: 41 mL/min — AB (ref >60)
GLUCOSE: 98 mg/dL (ref 65–99)
POTASSIUM: 3.7 mmol/L (ref 3.5–5.1)
SODIUM: 138 mmol/L (ref 135–145)
TOTAL PROTEIN: 6.8 g/dL (ref 6.5–8.1)

## 2017-02-28 LAB — LIPASE, BLOOD: Lipase: 37 U/L (ref 11–51)

## 2017-02-28 LAB — PROTIME-INR
INR: 1.16
PROTHROMBIN TIME: 14.8 s (ref 11.4–15.2)

## 2017-02-28 LAB — APTT: APTT: 52 s — AB (ref 24–36)

## 2017-02-28 MED ORDER — ONDANSETRON HCL 4 MG/2ML IJ SOLN
4.0000 mg | Freq: Once | INTRAMUSCULAR | Status: AC
  Administered 2017-03-01: 4 mg via INTRAVENOUS
  Filled 2017-02-28: qty 2

## 2017-02-28 MED ORDER — ONDANSETRON HCL 4 MG/2ML IJ SOLN: 4.0000 mg | Freq: Four times a day (QID) | INTRAMUSCULAR | Status: DC | PRN

## 2017-02-28 MED ORDER — HYDROMORPHONE HCL 1 MG/ML IJ SOLN
0.5000 mg | INTRAMUSCULAR | Status: AC | PRN
  Administered 2017-03-01 – 2017-03-03 (×3): 0.5 mg via INTRAVENOUS
  Filled 2017-02-28 (×3): qty 1

## 2017-02-28 MED ORDER — TRAZODONE HCL 50 MG PO TABS
25.0000 mg | ORAL_TABLET | Freq: Every evening | ORAL | Status: DC | PRN
  Administered 2017-03-01 – 2017-03-03 (×3): 25 mg via ORAL
  Filled 2017-02-28 (×4): qty 1

## 2017-02-28 MED ORDER — ONDANSETRON HCL 4 MG PO TABS: 4.0000 mg | ORAL_TABLET | Freq: Four times a day (QID) | ORAL | Status: DC | PRN

## 2017-02-28 MED ORDER — ACETAMINOPHEN 325 MG PO TABS
650.0000 mg | ORAL_TABLET | Freq: Four times a day (QID) | ORAL | Status: DC | PRN
  Administered 2017-03-01 – 2017-03-03 (×2): 650 mg via ORAL
  Filled 2017-02-28 (×2): qty 2

## 2017-02-28 MED ORDER — POTASSIUM CHLORIDE IN NACL 20-0.45 MEQ/L-% IV SOLN
INTRAVENOUS | Status: DC
  Administered 2017-03-01: 02:00:00 via INTRAVENOUS
  Filled 2017-02-28 (×3): qty 1000

## 2017-02-28 NOTE — ED Triage Notes (Signed)
Rectal bleeding x 2 days. progressively has gotten worse.  States today there was a moderate amount bright red blood in the toliet.

## 2017-02-28 NOTE — H&P (Signed)
History and Physical    Kimberly [name] KGY:[id number] DOB: July 24, 1926 DOA: 02/28/2017  PCP: Wardell Honour, MD   Patient coming from: Home.  I have personally briefly reviewed patient's old medical records in [place]  Chief Complaint: Rectal bleeding.  HPI: Kimberly [name] is a 83 y.o. female with medical history significant of history of anemia, diverticulitis, antral ulcer, history of combined CHF, chronic atrial fibrillation on Eliquis, pacemaker placement, chronic kidney disease, depression, osteoarthritis, scoliosis who is coming to the emergency department with complaints of worsening chronic rectal bleeding for the past 2 days associated with dizziness, headache, decreased appetite and abdominal pain. She mentions that her constipation has been worse over the past few days and probably has caused increased bleeding. No liver disease history, chest pain, palpitations, diaphoresis, PND, orthopnea or pitting edema lower extremities. No nausea, emesis, diarrhea or melena. Complains of frequency due to furosemide use, but denies dysuria, hematuria or oliguria.  ED Course: Initial vital signs in the emergency department temperature 97.27F, pulse 70, blood pressure 145-70 mmHg, respirations 18 and O2 sat 100%.  Workup shows positive occult blood in stool. Her urinalysis shows small leukocyte esterase, but no bacteria. CBC showed WBC of 4.5, hemoglobin 13.5 g/dL and platelets 153. Her MCV was slightly elevated at 101.0 . Coagulation parameters showed a PT was 21 seconds, INR 1.30 and PTT 52 seconds. Lipase level was 37. All other chemistry results are unremarkable.  Review of Systems: As per HPI otherwise 10 point review of systems negative.    Past Medical History:  Diagnosis Date  . Acute blood loss anemia 10/11/2012  . Acute diverticulitis 08/24/2013  . Acute on chronic combined systolic and diastolic CHF, NYHA class 4 ([place]) 11/15/2013  . Antral ulcer 10/11/2012  . Arrhythmia    atrial fibb  . Atrial fibrillation ([place])   . Cardiomyopathy, nonischemic ([place])   . Chronic anticoagulation 10/12/2012  . CKD (chronic kidney disease) stage 3, GFR 30-59 ml/min (HCC) 10/12/2012  . Depression   . Erosive esophagitis 10/11/2012  . Fibromyalgia   . Glaucoma   . H/O echocardiogram 2007   EF 40-45%,         . Hypertension   . Osteoarthritis   . Pacemaker    Last saw cards 07/2013  . Scoliosis     Past Surgical History:  Procedure Laterality Date  . ABDOMINAL HYSTERECTOMY    . APPENDECTOMY    . BACK SURGERY    . BREAST SURGERY    . CARDIAC CATHETERIZATION  12/08/2005   LAD AND LEFT MAIN WITH NO HIGH-GRADE STENOSIS. MILD DISEASE IN THE CX AND LAD SYSTEM. SEVERE LV DYSFUNCTION WITH DILATION OF THE LV. EF 15-20%. LV END-DIASTOLIC PRESSURE IS 90. +1 MR.  . CHOLECYSTECTOMY    . CYSTOSCOPY N/A 02/24/2013   Procedure: CYSTOSCOPY WITH URETHRAL DILITATION;  Surgeon: Marissa Nestle, MD;  Location: [facility];  Service: Urology;  Laterality: N/A;  . DOPPLER ECHOCARDIOGRAPHY N/A 05/30/2010   LV SIZE IS NORMAL. LV SYSTOLIC FUNCTION IS LOW NORMAL. EF=50-55%. MILD INFERIOR HYPOKINESIS.MILD TO MODERATE POSTERIOR WALL HYPOKINESIS.PACEMAKER LEAD IN THE RV. LA IS MILDLY DILATED. RA IS MODERATE TO SEVERLY DILATED. PACEMAKER LEAD IN THE RA. MILD CALCICICATION OF THE MV APPARATUS. MODERATE MR. MILD TO MODERATE TR. MILD PHTN.AV MILDLY SCLEROTIC.  [name] ESOPHAGOGASTRODUODENOSCOPY N/A 10/13/2012   Dr. Gala Romney: severe ulcerative reflux esophagitis, question of Barrett's but negative path, single deep prepyloric antral ulcer, negative H.pylori  . HERNIA REPAIR     right  inguinal hernia and umbilical  . LOWETR EXT VENOUS Bilateral 11-08-10   R & L- NO EVIDENCE OF THROMBUS OR THROMBOPHLEBITIS. THERE IS MILD AMOUNT OF SUBCUTANEOUS EDEMA NOTED WITHIN THE LEFT CALF AND ANKLE. R & L GSV AND SSV- NO VENOUS INSUFF NOTED.  [name] NECK SURGERY    . NUCLEAR STRESS TEST N/A 02/13/2009   NORMAL PATTERN OF PERFUSION IN ALL  REGIONS. POST STRESS VENTICULAR SIZE IS NORMAL. POST  STESS EF 85%.  NORMAL MYOCARDIAL PERFUSION STUDY.  [name] PACEMAKER INSERTION    . TONSILLECTOMY    . YAG LASER APPLICATION Bilateral 07/29/7122   Procedure: YAG LASER APPLICATION;  Surgeon: Williams Che, MD;  Location: [facility];  Service: Ophthalmology;  Laterality: Bilateral;     reports that she quit smoking about 12 years ago. Her smoking use included Cigarettes. She has a 15.00 pack-year smoking history. She has never used smokeless tobacco. She reports that she drinks alcohol. She reports that she does not use drugs.  Allergies  Allergen Reactions  . Penicillins Hives    Has patient had a PCN reaction causing immediate rash, facial/tongue/throat swelling, SOB or lightheadedness with hypotension: Yes Has patient had a PCN reaction causing severe rash involving mucus membranes or skin necrosis: No Has patient had a PCN reaction that required hospitalization No Has patient had a PCN reaction occurring within the last 10 years: Yes If all of the above answers are "NO", then may proceed with Cephalosporin use.   . Sulfa Antibiotics Rash    Family History  Problem Relation Age of Onset  . Cancer Sister   . Asthma Sister   . Heart failure Brother   . Pulmonary embolism Brother   . Cancer Brother   . Colon cancer Neg Hx     Prior to Admission medications   Medication Sig Start Date End Date Taking? Authorizing Provider  acetaminophen (TYLENOL) 500 MG tablet Take 500 mg by mouth every 6 (six) hours as needed.   Yes [provider]  carvedilol (COREG) 6.25 MG tablet TAKE 1 TABLET BY MOUTH TWO  TIMES DAILY WITH A MEAL 01/13/17  Yes Croitoru, Mihai, MD  ELIQUIS 2.5 MG TABS tablet TAKE 1 TABLET BY MOUTH  TWICE A DAY 09/30/16  Yes Croitoru, Mihai, MD  furosemide (LASIX) 20 MG tablet Take 1 tablet (20 mg total) by mouth daily. 04/07/16  Yes Croitoru, Mihai, MD  Multiple Vitamins-Minerals (ICAPS AREDS 2) CAPS Take 2 capsules by mouth  daily.   Yes [provider]  Polyethyl Glycol-Propyl Glycol (SYSTANE ULTRA) 0.4-0.3 % SOLN Place 1-2 drops into both eyes 2 (two) times daily as needed.   Yes [provider]    Physical Exam: Vitals:   02/28/17 1847 02/28/17 1853 02/28/17 2325  BP:  (!) 145/70 (!) 150/72  Pulse:  70 76  Resp:  18 18  Temp:  97.9 F (36.6 C) 97.7 F (36.5 C)  TempSrc:  Oral Oral  SpO2:  100% 95%  Weight: 67.6 kg (149 lb)  68.8 kg (151 lb 11.2 oz)  Height: 5\' 5"  (1.651 m)  5\' 5"  (1.651 m)    Constitutional: NAD, calm, comfortable Eyes: PERRL, lids and conjunctivae normal ENMT: Mucous membranes are moist. Posterior pharynx clear of any exudate or lesions. Neck: normal, supple, no masses, no thyromegaly Respiratory: clear to auscultation bilaterally, no wheezing, no crackles. Normal respiratory effort. No accessory muscle use.  Cardiovascular: Regular rate and rhythm, no murmurs / rubs / gallops. No extremity edema. 2+ pedal pulses.  No carotid bruits.  Abdomen: Mild diffuse tenderness, no guarding/rebound/masses palpated. No hepatosplenomegaly. Bowel sounds positive.  Musculoskeletal: no clubbing / cyanosis. No joint deformity upper and lower extremities. Good ROM, no contractures. Normal muscle tone.  Skin: Small areas of ecchymosis on extremities, otherwise no significant rashes, lesions, ulcers on limited skin exam. No induration Neurologic: CN 2-12 grossly intact. Sensation intact, DTR normal. Strength 5/5 in all 4.  Psychiatric: Normal judgment and insight. Alert and oriented x 4. Normal mood.    Labs on Admission: I have personally reviewed following labs and imaging studies  CBC:  Recent Labs Lab 02/26/17 1509 02/28/17 1938  WBC 6.8 4.5  NEUTROABS 4.8 2.9  HGB 13.3 13.5  HCT 39.2 39.2  MCV 102* 101.0*  PLT 186 742   Basic Metabolic Panel:  Recent Labs Lab 02/26/17 1509 02/28/17 1938  NA 141 138  K 4.2 3.7  CL 104 104  CO2 23 23  GLUCOSE 89 98  BUN 20  21*  CREATININE 1.38* 1.30*  CALCIUM 9.3 9.1   GFR: Estimated Creatinine Clearance: 29.1 mL/min (A) (by C-G formula based on SCr of 1.3 mg/dL (H)). Liver Function Tests:  Recent Labs Lab 02/26/17 1509 02/28/17 1938  AST 20 24  ALT 10 14  ALKPHOS 57 51  BILITOT 0.5 0.6  PROT 6.7 6.8  ALBUMIN 4.3 4.0    Recent Labs Lab 02/28/17 1938  LIPASE 37   No results for input(s): AMMONIA in the last 168 hours. Coagulation Profile:  Recent Labs Lab 02/28/17 1938  INR 1.16   Cardiac Enzymes: No results for input(s): CKTOTAL, CKMB, CKMBINDEX, TROPONINI in the last 168 hours. BNP (last 3 results) No results for input(s): PROBNP in the last 8760 hours. HbA1C: No results for input(s): HGBA1C in the last 72 hours. CBG: No results for input(s): GLUCAP in the last 168 hours. Lipid Profile:  Recent Labs  02/26/17 1509  CHOL 154  HDL 66  LDLCALC 74  TRIG 72  CHOLHDL 2.3   Thyroid Function Tests: No results for input(s): TSH, T4TOTAL, FREET4, T3FREE, THYROIDAB in the last 72 hours. Anemia Panel: No results for input(s): VITAMINB12, FOLATE, FERRITIN, TIBC, IRON, RETICCTPCT in the last 72 hours. Urine analysis:    Component Value Date/Time   COLORURINE STRAW (A) 02/28/2017 1952   APPEARANCEUR HAZY (A) 02/28/2017 1952   LABSPEC 1.006 02/28/2017 1952   PHURINE 6.0 02/28/2017 1952   GLUCOSEU NEGATIVE 02/28/2017 1952   HGBUR NEGATIVE 02/28/2017 [place] NEGATIVE 02/28/2017 1952   BILIRUBINUR neg 04/25/2014 [place] 02/28/2017 1952   PROTEINUR NEGATIVE 02/28/2017 1952   UROBILINOGEN 0.2 05/26/2014 1415   NITRITE NEGATIVE 02/28/2017 1952   LEUKOCYTESUR SMALL (A) 02/28/2017 1952    Radiological Exams on Admission: No results found.  EKG: Independently reviewed.   Assessment/Plan Principal Problem:   Rectal bleeding Observation/telemetry. Clear liquids diet, then nothing by mouth after midnight. Gentle and time-limited IV fluids. Hold  Eliquis. Monitor hematocrit and hemoglobin. GI evaluation in a.m.  Active Problems:   Permanent atrial fibrillation (HCC)  CHA?DS?-VASc Score of at least 5. Continue carvedilol for rate control. Hold anticoagulation due to rectal bleeding.    Fibromyalgia Stable. Analgesics as needed.    Essential hypertension Continue carvedilol. Monitor blood pressure.    Chronic diastolic heart failure (HCC) Compensated. Continue carvedilol 6.25 mg by mouth twice a day. Hold furosemide.    History of PUD (peptic ulcer disease) Protonix 40 mg IVP every 24 hours.  Glaucoma Continue Isopto tears/goniovisc drops per     DVT prophylaxis: Has been on Eliquis at home. Code Status: Full code. Family Communication:  Disposition Plan: Admit for H&H monitoring and GI evaluation. Consults called: Routine gastroenterology consult in a.m. Admission status: Observation/telemetry.   Reubin Milan MD [facility] Pager [phone].  If 7PM-7AM, please contact night-coverage [email or web address] Password [email or web address]  02/28/2017, 11:36 PM

## 2017-02-28 NOTE — ED Provider Notes (Signed)
[facility] UNIT Provider Note   CSN: [id number] Arrival date & time: 02/28/17  1842     History   Chief Complaint Chief Complaint  Patient presents with  . Rectal Bleeding    HPI Kimberly [name] is a 83 y.o. female.  HPI Patient states she has chronic rectal bleeding which worsened over the last 2-day.  She is on Eliquis for atrial fibrillation.  She is unable to quantify the amount of bleeding she is having.  She does complain of generalized abdominal pain and global headache.  She has some lightheadedness especially with standing.  Denies any urinary symptoms other than frequency which she attributes to her Lasix intake. Past Medical History:  Diagnosis Date  . Acute blood loss anemia 10/11/2012  . Acute diverticulitis 08/24/2013  . Acute on chronic combined systolic and diastolic CHF, NYHA class 4 ([place]) 11/15/2013  . Antral ulcer 10/11/2012  . Arrhythmia    atrial fibb  . Atrial fibrillation ([place])   . Cardiomyopathy, nonischemic ([place])   . Chronic anticoagulation 10/12/2012  . CKD (chronic kidney disease) stage 3, GFR 30-59 ml/min (HCC) 10/12/2012  . Depression   . Erosive esophagitis 10/11/2012  . Fibromyalgia   . Glaucoma   . H/O echocardiogram 2007   EF 40-45%,         . Hypertension   . Osteoarthritis   . Pacemaker    Last saw cards 07/2013  . Scoliosis     Patient Active Problem List   Diagnosis Date Noted  . Hematochezia 03/01/2017  . Glaucoma 02/28/2017  . Pacemaker 03/21/2015  . Non-ischemic cardiomyopathy ([place]) 11/28/2014  . Rectal bleeding 06/14/2014  . PUD (peptic ulcer disease) 06/14/2014  . History of bipolar disorder   . Hypokalemia 05/26/2014  . Fall at home 05/26/2014  . Chronic diastolic heart failure ([place]) 11/15/2013  . Complete heart block ([place]) 11/15/2013  . Inability to ambulate due to ankle or foot 09/05/2013  . Unspecified constipation 08/26/2013  . Anemia 08/25/2013  . CKD (chronic kidney disease) stage 3, GFR 30-59 ml/min  (HCC) 10/12/2012  . Chronic anticoagulation 10/12/2012  . Osteoarthritis   . Permanent atrial fibrillation ([place])   . Biventricular cardiac pacemaker - [employer] Consulta   . Fibromyalgia   . Essential hypertension   . Depression     Past Surgical History:  Procedure Laterality Date  . ABDOMINAL HYSTERECTOMY    . APPENDECTOMY    . BACK SURGERY    . BREAST SURGERY    . CARDIAC CATHETERIZATION  12/08/2005   LAD AND LEFT MAIN WITH NO HIGH-GRADE STENOSIS. MILD DISEASE IN THE CX AND LAD SYSTEM. SEVERE LV DYSFUNCTION WITH DILATION OF THE LV. EF 15-20%. LV END-DIASTOLIC PRESSURE IS 90. +1 MR.  . CHOLECYSTECTOMY    . CYSTOSCOPY N/A 02/24/2013   Procedure: CYSTOSCOPY WITH URETHRAL DILITATION;  Surgeon: Marissa Nestle, MD;  Location: [facility];  Service: Urology;  Laterality: N/A;  . DOPPLER ECHOCARDIOGRAPHY N/A 05/30/2010   LV SIZE IS NORMAL. LV SYSTOLIC FUNCTION IS LOW NORMAL. EF=50-55%. MILD INFERIOR HYPOKINESIS.MILD TO MODERATE POSTERIOR WALL HYPOKINESIS.PACEMAKER LEAD IN THE RV. LA IS MILDLY DILATED. RA IS MODERATE TO SEVERLY DILATED. PACEMAKER LEAD IN THE RA. MILD CALCICICATION OF THE MV APPARATUS. MODERATE MR. MILD TO MODERATE TR. MILD PHTN.AV MILDLY SCLEROTIC.  [name] ESOPHAGOGASTRODUODENOSCOPY N/A 10/13/2012   Dr. Gala Romney: severe ulcerative reflux esophagitis, question of Barrett's but negative path, single deep prepyloric antral ulcer, negative H.pylori  . HERNIA REPAIR     right  inguinal hernia and umbilical  . LOWETR EXT VENOUS Bilateral 11-08-10   R & L- NO EVIDENCE OF THROMBUS OR THROMBOPHLEBITIS. THERE IS MILD AMOUNT OF SUBCUTANEOUS EDEMA NOTED WITHIN THE LEFT CALF AND ANKLE. R & L GSV AND SSV- NO VENOUS INSUFF NOTED.  [name] NECK SURGERY    . NUCLEAR STRESS TEST N/A 02/13/2009   NORMAL PATTERN OF PERFUSION IN ALL REGIONS. POST STRESS VENTICULAR SIZE IS NORMAL. POST  STESS EF 85%.  NORMAL MYOCARDIAL PERFUSION STUDY.  [name] PACEMAKER INSERTION    . TONSILLECTOMY    . YAG LASER APPLICATION Bilateral  09/19/6158   Procedure: YAG LASER APPLICATION;  Surgeon: Williams Che, MD;  Location: [facility];  Service: Ophthalmology;  Laterality: Bilateral;    OB History    Gravida Para Term Preterm AB Living   5 2 1 1 3 2    SAB TAB Ectopic Multiple Live Births   3               Home Medications    Prior to Admission medications   Medication Sig Start Date End Date Taking? Authorizing Provider  acetaminophen (TYLENOL) 500 MG tablet Take 500 mg by mouth every 6 (six) hours as needed.   Yes [provider]  carvedilol (COREG) 6.25 MG tablet TAKE 1 TABLET BY MOUTH TWO  TIMES DAILY WITH A MEAL 01/13/17  Yes Croitoru, Mihai, MD  ELIQUIS 2.5 MG TABS tablet TAKE 1 TABLET BY MOUTH  TWICE A DAY 09/30/16  Yes Croitoru, Mihai, MD  furosemide (LASIX) 20 MG tablet Take 1 tablet (20 mg total) by mouth daily. 04/07/16  Yes Croitoru, Mihai, MD  Multiple Vitamins-Minerals (ICAPS AREDS 2) CAPS Take 2 capsules by mouth daily.   Yes [provider]  Polyethyl Glycol-Propyl Glycol (SYSTANE ULTRA) 0.4-0.3 % SOLN Place 1-2 drops into both eyes 2 (two) times daily as needed.    [provider]    Family History Family History  Problem Relation Age of Onset  . Cancer Sister   . Asthma Sister   . Heart failure Brother   . Pulmonary embolism Brother   . Cancer Brother   . Colon cancer Neg Hx     Social History Social History  Substance Use Topics  . Smoking status: Former Smoker    Packs/day: 0.50    Years: 30.00    Types: Cigarettes    Quit date: 12/13/2004  . Smokeless tobacco: Never Used     Comment: former smoker  . Alcohol use 0.0 oz/week     Comment: history of drinking a 1/2 glass of wine in the evening     Allergies   Penicillins and Sulfa antibiotics   Review of Systems Review of Systems  Constitutional: Negative for chills and fever.  Respiratory: Negative for cough and shortness of breath.   Cardiovascular: Negative for chest pain.  Gastrointestinal: Positive  for abdominal pain and blood in stool. Negative for constipation, diarrhea, nausea and vomiting.  Genitourinary: Positive for frequency. Negative for difficulty urinating, dysuria, flank pain and hematuria.  Musculoskeletal: Negative for back pain and myalgias.  Skin: Negative for rash and wound.  Neurological: Positive for light-headedness and headaches. Negative for dizziness, tremors, syncope, weakness and numbness.  All other systems reviewed and are negative.    Physical Exam Updated Vital Signs BP (!) 142/76 (BP Location: Left Arm)   Pulse 71   Temp 97.6 F (36.4 C) (Oral)   Resp 18   Ht 5\' 5"  (1.651 m)   Wt  68.8 kg (151 lb 11.2 oz)   SpO2 99%   BMI 25.24 kg/m   Physical Exam  Constitutional: She is oriented to person, place, and time. She appears well-developed and well-nourished. No distress.  HENT:  Head: Normocephalic and atraumatic.  Mouth/Throat: Oropharynx is clear and moist.  Eyes: Pupils are equal, round, and reactive to light. EOM are normal.  Neck: Normal range of motion. Neck supple.  Cardiovascular: Normal rate and regular rhythm.   Pulmonary/Chest: Effort normal and breath sounds normal.  Abdominal: Soft. Bowel sounds are normal. There is tenderness. There is no rebound and no guarding.  Diffuse abdominal tenderness without focality.  No rebound or guarding.  Genitourinary: Rectal exam shows guaiac positive stool.  Genitourinary Comments: Brown stool.  No obvious bleeding hemorrhoids or anal fissures.  Musculoskeletal: Normal range of motion. She exhibits no edema or tenderness.  No CVA tenderness bilaterally.  Neurological: She is alert and oriented to person, place, and time.  Skin: Skin is warm and dry. No rash noted. No erythema.  Psychiatric: She has a normal mood and affect. Her behavior is normal.  Nursing note and vitals reviewed.    ED Treatments / Results  Labs (all labs ordered are listed, but only abnormal results are displayed) Labs  Reviewed  CBC WITH DIFFERENTIAL/PLATELET - Abnormal; Notable for the following:       Result Value   MCV 101.0 (*)    MCH 34.8 (*)    All other components within normal limits  COMPREHENSIVE METABOLIC PANEL - Abnormal; Notable for the following:    BUN 21 (*)    Creatinine, Ser 1.30 (*)    GFR calc non Af Amer 36 (*)    GFR calc Af Amer 41 (*)    All other components within normal limits  APTT - Abnormal; Notable for the following:    aPTT 52 (*)    All other components within normal limits  URINALYSIS, ROUTINE W REFLEX MICROSCOPIC - Abnormal; Notable for the following:    Color, Urine STRAW (*)    APPearance HAZY (*)    Leukocytes, UA SMALL (*)    Squamous Epithelial / LPF 0-5 (*)    All other components within normal limits  BASIC METABOLIC PANEL - Abnormal; Notable for the following:    Creatinine, Ser 1.15 (*)    GFR calc non Af Amer 41 (*)    GFR calc Af Amer 48 (*)    All other components within normal limits  CBC - Abnormal; Notable for the following:    RBC 3.66 (*)    MCV 101.4 (*)    All other components within normal limits  POC OCCULT BLOOD, ED - Abnormal; Notable for the following:    Fecal Occult Bld POSITIVE (*)    All other components within normal limits  LIPASE, BLOOD  PROTIME-INR  HEMOGLOBIN  HEMOGLOBIN  OCCULT BLOOD X 1 CARD TO LAB, STOOL  VITAMIN B12  FOLATE  TYPE AND SCREEN    EKG  EKG Interpretation None       Radiology Ct Abdomen Pelvis Wo Contrast  Result Date: 03/01/2017 CLINICAL DATA:  Oral only Abdominal pain with rectal bleeding Hx of htn,CKD III, hysterectomy,appendectomy/bbj EXAM: CT ABDOMEN AND PELVIS WITHOUT CONTRAST TECHNIQUE: Multidetector CT imaging of the abdomen and pelvis was performed following the standard protocol without IV contrast. COMPARISON:  06/24/2016 FINDINGS: Lower chest: Coronary and aortic calcifications. Transvenous pacing leads partially visualized. No pleural or pericardial effusion. Small hiatal hernia.  Visualized lung  bases clear. Hepatobiliary: Stable cysts in segments 3, 4B, and 6. No new liver abnormality is seen. Status post cholecystectomy. No biliary dilatation. Pancreas: Unremarkable. No pancreatic ductal dilatation or surrounding inflammatory changes. Spleen: Normal in size without focal abnormality. Adrenals/Urinary Tract: Small calcifications in the right renal collecting system, largest stone or cluster approximately 0.4 cm. No hydronephrosis. No ureterectasis. Urinary bladder is incompletely distended. Stomach/Bowel: Hiatal hernia. Stomach is nondilated. Small bowel is nondilated. Appendix reportedly surgically absent. The colon is nondistended. Multiple diverticula from distal descending and sigmoid portions of the colon, without adjacent inflammatory/edematous change or abscess. Vascular/Lymphatic: Heavy calcified plaque through the visualized distal descending thoracic and abdominal aorta without aneurysm. Extensive visceral and renal arterial calcified plaque. Patchy bilateral iliofemoral arterial plaque. No abdominal or pelvic adenopathy localized. Reproductive: Status post hysterectomy. No adnexal masses. Other: No ascites.  No free air. Musculoskeletal: Moderately severe thoracolumbar dextroscoliosis apex L1-2 with multilevel spondylitic changes throughout the lumbar spine. Chronic T12 compression deformity. No acute fracture or worrisome bone lesion. IMPRESSION: 1. No acute findings. 2. Right nephrolithiasis without hydronephrosis. 3. Hiatal hernia 4. Descending and sigmoid diverticulosis. 5. Extensive aortoiliac, coronary, visceral and renal artery calcified atheromatous plaque. 6. Lumbar scoliosis, multilevel spondylitic change, and chronic T12 compression deformity. Electronically Signed   By: Lucrezia Europe M.D.   On: 03/01/2017 14:28   Dg Chest 2 View  Result Date: 03/01/2017 CLINICAL DATA:  atrial fibrillation, diverticulosis, chronic diastolic CHF, essential hypertension, fibromyalgia,  CKD stage III, complete heart block status post permanent pacemaker presented with one-week history of intermittent rectal bleeding that had worsened over the past 2-3 daysHISTORY OF CHF, HTN EXAM: CHEST - 2 VIEW COMPARISON:  06/24/2016 FINDINGS: Lungs hyperinflated, clear. Heart size upper limits normal. Tortuous atheromatous thoracic aorta. Stable left subclavian AICD. No effusion.  No pneumothorax. Diffuse osteopenia. Compression deformity near the thoracolumbar junction, present since 06/24/2016. IMPRESSION: 1. Stable borderline cardiomegaly.  No acute disease. 2.  Aortic Atherosclerosis (ICD10-170.0) Electronically Signed   By: Lucrezia Europe M.D.   On: 03/01/2017 14:54   US Venous Img Lower Bilateral  Result Date: 03/01/2017 CLINICAL DATA:  Pain at rest x months. EXAM: BILATERAL LOWER EXTREMITY VENOUS DOPPLER ULTRASOUND TECHNIQUE: Gray-scale sonography with compression, as well as color and duplex ultrasound, were performed to evaluate the deep venous system from the level of the common femoral vein through the popliteal and proximal calf veins. COMPARISON:  None FINDINGS: Normal compressibility of the common femoral, superficial femoral, and popliteal veins, as well as the proximal calf veins. No filling defects to suggest DVT on grayscale or color Doppler imaging. Doppler waveforms show normal direction of venous flow, normal respiratory phasicity and response to augmentation. Visualized segments of the saphenous venous systems normal in caliber and compressibility. IMPRESSION: No evidence of  lower extremity deep vein thrombosis, bilaterally. Electronically Signed   By: Lucrezia Europe M.D.   On: 03/01/2017 14:03    Procedures Procedures (including critical care time)  Medications Ordered in ED Medications  acetaminophen (TYLENOL) tablet 650 mg (not administered)  HYDROmorphone (DILAUDID) injection 0.5 mg (not administered)  traZODone (DESYREL) tablet 25 mg (not administered)  ondansetron (ZOFRAN)  tablet 4 mg (not administered)    Or  ondansetron (ZOFRAN) injection 4 mg (not administered)  carvedilol (COREG) tablet 6.25 mg (6.25 mg Oral Given 03/01/17 0933)  polyvinyl alcohol (LIQUIFILM TEARS) 1.4 % ophthalmic solution 1 drop (not administered)  0.9 % NaCl with KCl 20 mEq/ L  infusion ( Intravenous New Bag/Given 03/01/17 0936)  iopamidol (  ISOVUE-300) 61 % injection (not administered)  ondansetron (ZOFRAN) injection 4 mg (4 mg Intravenous Given 03/01/17 0146)     Initial Impression / Assessment and Plan / ED Course  I have reviewed the triage vital signs and the nursing notes.  Pertinent labs & imaging results that were available during my care of the patient were reviewed by me and considered in my medical decision making (see chart for details).     Discussed with hospitalist who will admit for observation.  Patient remains hemodynamically stable.  Final Clinical Impressions(s) / ED Diagnoses   Final diagnoses:  Lower GI bleed    New Prescriptions Current Discharge Medication List       Julianne Rice, MD 03/01/17 [phone]

## 2017-03-01 ENCOUNTER — Encounter (HOSPITAL_COMMUNITY): Payer: Self-pay | Admitting: *Deleted

## 2017-03-01 ENCOUNTER — Observation Stay (HOSPITAL_COMMUNITY): Payer: Medicare Other

## 2017-03-01 DIAGNOSIS — I482 Chronic atrial fibrillation: Secondary | ICD-10-CM

## 2017-03-01 DIAGNOSIS — K922 Gastrointestinal hemorrhage, unspecified: Secondary | ICD-10-CM | POA: Diagnosis not present

## 2017-03-01 DIAGNOSIS — M79661 Pain in right lower leg: Secondary | ICD-10-CM | POA: Diagnosis not present

## 2017-03-01 DIAGNOSIS — N183 Chronic kidney disease, stage 3 (moderate): Secondary | ICD-10-CM

## 2017-03-01 DIAGNOSIS — I10 Essential (primary) hypertension: Secondary | ICD-10-CM | POA: Diagnosis not present

## 2017-03-01 DIAGNOSIS — I5032 Chronic diastolic (congestive) heart failure: Secondary | ICD-10-CM

## 2017-03-01 DIAGNOSIS — M797 Fibromyalgia: Secondary | ICD-10-CM

## 2017-03-01 DIAGNOSIS — I428 Other cardiomyopathies: Secondary | ICD-10-CM | POA: Diagnosis not present

## 2017-03-01 DIAGNOSIS — K5730 Diverticulosis of large intestine without perforation or abscess without bleeding: Secondary | ICD-10-CM | POA: Diagnosis not present

## 2017-03-01 DIAGNOSIS — K921 Melena: Secondary | ICD-10-CM | POA: Diagnosis not present

## 2017-03-01 DIAGNOSIS — Z7901 Long term (current) use of anticoagulants: Secondary | ICD-10-CM | POA: Diagnosis not present

## 2017-03-01 DIAGNOSIS — I442 Atrioventricular block, complete: Secondary | ICD-10-CM | POA: Diagnosis not present

## 2017-03-01 DIAGNOSIS — K625 Hemorrhage of anus and rectum: Secondary | ICD-10-CM | POA: Diagnosis not present

## 2017-03-01 DIAGNOSIS — I4891 Unspecified atrial fibrillation: Secondary | ICD-10-CM | POA: Diagnosis not present

## 2017-03-01 DIAGNOSIS — M79662 Pain in left lower leg: Secondary | ICD-10-CM | POA: Diagnosis not present

## 2017-03-01 LAB — BASIC METABOLIC PANEL
ANION GAP: 11 (ref 5–15)
BUN: 18 mg/dL (ref 6–20)
CALCIUM: 8.9 mg/dL (ref 8.9–10.3)
CO2: 23 mmol/L (ref 22–32)
Chloride: 107 mmol/L (ref 101–111)
Creatinine, Ser: 1.15 mg/dL — ABNORMAL HIGH (ref 0.44–1.00)
GFR calc Af Amer: 48 mL/min — ABNORMAL LOW (ref >60)
GFR, EST NON AFRICAN AMERICAN: 41 mL/min — AB (ref >60)
Glucose, Bld: 88 mg/dL (ref 65–99)
POTASSIUM: 4 mmol/L (ref 3.5–5.1)
SODIUM: 141 mmol/L (ref 135–145)

## 2017-03-01 LAB — TYPE AND SCREEN
ABO/RH(D): A NEG
Antibody Screen: NEGATIVE

## 2017-03-01 LAB — HEMOGLOBIN
HEMOGLOBIN: 12.5 g/dL (ref 12.0–15.0)
HEMOGLOBIN: 12.8 g/dL (ref 12.0–15.0)

## 2017-03-01 LAB — CBC
HEMATOCRIT: 37.1 % (ref 36.0–46.0)
HEMOGLOBIN: 12.4 g/dL (ref 12.0–15.0)
MCH: 33.9 pg (ref 26.0–34.0)
MCHC: 33.4 g/dL (ref 30.0–36.0)
MCV: 101.4 fL — ABNORMAL HIGH (ref 78.0–100.0)
Platelets: 157 10*3/uL (ref 150–400)
RBC: 3.66 MIL/uL — ABNORMAL LOW (ref 3.87–5.11)
RDW: 14.2 % (ref 11.5–15.5)
WBC: 5 10*3/uL (ref 4.0–10.5)

## 2017-03-01 MED ORDER — POTASSIUM CHLORIDE IN NACL 20-0.9 MEQ/L-% IV SOLN
INTRAVENOUS | Status: AC
  Administered 2017-03-01: 10:00:00 via INTRAVENOUS

## 2017-03-01 MED ORDER — HYPROMELLOSE (GONIOSCOPIC) 2.5 % OP SOLN
1.0000 [drp] | Freq: Two times a day (BID) | OPHTHALMIC | Status: DC | PRN
  Filled 2017-03-01: qty 15

## 2017-03-01 MED ORDER — IOPAMIDOL (ISOVUE-300) INJECTION 61%
INTRAVENOUS | Status: AC
  Filled 2017-03-01: qty 30

## 2017-03-01 MED ORDER — POLYVINYL ALCOHOL 1.4 % OP SOLN
1.0000 [drp] | Freq: Two times a day (BID) | OPHTHALMIC | Status: DC | PRN
  Filled 2017-03-01 (×2): qty 15

## 2017-03-01 MED ORDER — CARVEDILOL 3.125 MG PO TABS
6.2500 mg | ORAL_TABLET | Freq: Two times a day (BID) | ORAL | Status: DC
  Administered 2017-03-01 – 2017-03-05 (×8): 6.25 mg via ORAL
  Filled 2017-03-01 (×9): qty 2

## 2017-03-01 NOTE — Plan of Care (Signed)
Problem: Physical Regulation: Goal: Ability to maintain clinical measurements within normal limits will improve Outcome: Progressing Pt able to maintain measurements within normal limits; reviewed plan of care with client-verbalized uderstanding

## 2017-03-01 NOTE — Plan of Care (Signed)
Problem: Education: Goal: Knowledge of [facility] Education information/materials will improve Outcome: Progressing Discussed general education and information with client-verbalized uderstanding

## 2017-03-01 NOTE — Progress Notes (Signed)
PROGRESS NOTE  Kimberly [name] YWV:[id number] DOB: [birth year] DOA: 02/28/2017 PCP: Wardell Honour, MD  Brief History:  83 year old female with a history of permanent atrial fibrillation, diverticulosis, chronic diastolic CHF, essential hypertension, fibromyalgia, CKD stage III, complete heart block status post permanent pacemaker presented with one-week history of intermittent rectal bleeding that had worsened over the past 2-3 days.  The patient states that she normally struggles with constipation, and she states that in the past week she has had to strain more to have bowel movement.  Associated with this, the patient has had some worsening of her rectal bleeding and abdominal pain.  She also has a sensation of incomplete evacuation.  She stated that her abdominal pain has been intermittent this past week, but has become more constant in the past 2-3 days prior to admission.  She denies any fevers, chills, chest pain, nausea, vomiting, diarrhea.  However she endorses some dyspnea on exertion which she states has been the same as usual.  Upon presentation, the patient was afebrile hemodynamically stable with hemoglobin of 13.5.  Her last apixaban dose was on the morning of February 28, 2017.  Assessment/Plan: Hematochezia/abdominal pain -Concerned about ischemic colitis -CT abdomen and pelvis -Consult GI -Holding apixaban -Clear liquid diet -Trend hemoglobin -Judicious IV fluids -October 13, 2012 EGD--severe ulcerative reflux esophagitis, gastric erosions with a single antral ulcer  Chronic diastolic CHF -Daily weights -Continue home dose furosemide -November 28, 2014 Echo EF 60-65% -Continue carvedilol   Permanent atrial fibrillation -Rate controlled -holding apixaban in the setting of rectal bleeding -Status post AV node ablation-->CHB-->PPM -continue carvedilol  Dyspnea on exertion -Chest x-ray  CKD stage III -Baseline creatinine 1.2-1.6 -A.m. BMP  Essential  hypertension -Continue carvedilol  Complete heart block -Status post permanent pacemaker January 2012  Lower extremity pain and edema -venous duplex   Disposition Plan:   Home in 2-3 days  Family Communication:   No Family at bedside--Total time spent 35 minutes.  Greater than 50% spent face to face counseling and coordinating care.  Consultants:  GI  Code Status:  FULL   DVT Prophylaxis:  SCD   Procedures: As Listed in Progress Note Above  Antibiotics: None    Subjective:   Objective: Vitals:   02/28/17 1847 02/28/17 1853 02/28/17 2325 03/01/17 0522  BP:  (!) 145/70 (!) 150/72 (!) 148/79  Pulse:  70 76 90  Resp:  18 18 18   Temp:  97.9 F (36.6 C) 97.7 F (36.5 C) 97.7 F (36.5 C)  TempSrc:  Oral Oral Oral  SpO2:  100% 95% 99%  Weight: 67.6 kg (149 lb)  68.8 kg (151 lb 11.2 oz)   Height: 5\' 5"  (1.651 m)  5\' 5"  (1.651 m)    No intake or output data in the 24 hours ending 03/01/17 0840 Weight change:  Exam:   General:  Pt is alert, follows commands appropriately, not in acute distress  HEENT: No icterus, No thrush, No neck mass, [place]/AT  Cardiovascular: RRR, S1/S2, no rubs, no gallops  Respiratory: CTA bilaterally, no wheezing, no crackles, no rhonchi  Abdomen: Soft/+BS, non tender, non distended, no guarding  Extremities: No edema, No lymphangitis, No petechiae, No rashes, no synovitis   Data Reviewed: I have personally reviewed following labs and imaging studies Basic Metabolic Panel:  Recent Labs Lab 02/26/17 1509 02/28/17 1938 03/01/17 0608  NA 141 138 141  K 4.2 3.7 4.0  CL 104 104 107  CO2  23 23 23   GLUCOSE 89 98 88  BUN 20 21* 18  CREATININE 1.38* 1.30* 1.15*  CALCIUM 9.3 9.1 8.9   Liver Function Tests:  Recent Labs Lab 02/26/17 1509 02/28/17 1938  AST 20 24  ALT 10 14  ALKPHOS 57 51  BILITOT 0.5 0.6  PROT 6.7 6.8  ALBUMIN 4.3 4.0    Recent Labs Lab 02/28/17 1938  LIPASE 37   No results for input(s): AMMONIA in  the last 168 hours. Coagulation Profile:  Recent Labs Lab 02/28/17 1938  INR 1.16   CBC:  Recent Labs Lab 02/26/17 1509 02/28/17 1938 03/01/17 0035 03/01/17 0608  WBC 6.8 4.5  --  5.0  NEUTROABS 4.8 2.9  --   --   HGB 13.3 13.5 12.8 12.4  HCT 39.2 39.2  --  37.1  MCV 102* 101.0*  --  101.4*  PLT 186 153  --  157   Cardiac Enzymes: No results for input(s): CKTOTAL, CKMB, CKMBINDEX, TROPONINI in the last 168 hours. BNP: Invalid input(s): POCBNP CBG: No results for input(s): GLUCAP in the last 168 hours. HbA1C: No results for input(s): HGBA1C in the last 72 hours. Urine analysis:    Component Value Date/Time   COLORURINE STRAW (A) 02/28/2017 1952   APPEARANCEUR HAZY (A) 02/28/2017 1952   LABSPEC 1.006 02/28/2017 1952   PHURINE 6.0 02/28/2017 1952   GLUCOSEU NEGATIVE 02/28/2017 1952   HGBUR NEGATIVE 02/28/2017 [place] NEGATIVE 02/28/2017 1952   BILIRUBINUR neg 04/25/2014 [name] 02/28/2017 1952   PROTEINUR NEGATIVE 02/28/2017 1952   UROBILINOGEN 0.2 05/26/2014 1415   NITRITE NEGATIVE 02/28/2017 1952   LEUKOCYTESUR SMALL (A) 02/28/2017 1952   Sepsis Labs: @LABRCNTIP (procalcitonin:4,lacticidven:4) )No results found for this or any previous visit (from the past 240 hour(s)).   Scheduled Meds: . carvedilol  6.25 mg Oral BID WC   Continuous Infusions: . 0.45 % NaCl with KCl 20 mEq / L 88 mL/hr at 03/01/17 0146    Procedures/Studies: No results found.  Latarshia Jersey, DO  [facility] Pager [phone]  If 7PM-7AM, please contact night-coverage [email or web address] Password [email or web address] 03/01/2017, 8:40 AM   LOS: 0 days

## 2017-03-01 NOTE — Consult Note (Addendum)
Referring Provider: No ref. provider found Primary Care Physician:  Wardell Honour, MD Primary Gastroenterologist:  Dr.Zoeya Gramajo  Reason for Consultation:  Hematochezia.   HPI:   Pleasant 83 year old lady with multiple medical problems including atrial fibrillation on chronic anticoagulation therapy admitted to the hospital with a one-week history of intermittent small-volume rectal bleeding with vague lower abdominal cramps. Chronically constipated-some worsening recently. Does not feel that she has a good bowel movement. She does take various over-the-counter laxatives sporadically. Has not had any melena. No fever or chills. No hematemesis. Has chronic reflux symptoms a history of ulcerative reflux esophagitis-on no acid suppression therapy at home.  She is remaining quite stable since admission.  Hemoglobin on admission 13.5; 12.4 this morning.  Patient states it's been sometimes and she had a colonoscopy. I cannot find a report in the medical record.  I saw this lady for GI bleeding back in 2014 she was found to have rather severe reflux esophagitis and a prepyloric ulcer (biopsies negative for malignancy/H pylori).  CT of the abdomen pending.    Past Medical History:  Diagnosis Date  . Acute blood loss anemia 10/11/2012  . Acute diverticulitis 08/24/2013  . Acute on chronic combined systolic and diastolic CHF, NYHA class 4 ([place]) 11/15/2013  . Antral ulcer 10/11/2012  . Arrhythmia    atrial fibb  . Atrial fibrillation ([place])   . Cardiomyopathy, nonischemic ([place])   . Chronic anticoagulation 10/12/2012  . CKD (chronic kidney disease) stage 3, GFR 30-59 ml/min ([facility]) 10/12/2012  . Depression   . Erosive esophagitis 10/11/2012  . Fibromyalgia   . Glaucoma   . H/O echocardiogram 2007   EF 40-45%,         . Hypertension   . Osteoarthritis   . Pacemaker    Last saw cards 07/2013  . Scoliosis     Past Surgical History:  Procedure Laterality Date  . ABDOMINAL HYSTERECTOMY    .  APPENDECTOMY    . BACK SURGERY    . BREAST SURGERY    . CARDIAC CATHETERIZATION  12/08/2005   LAD AND LEFT MAIN WITH NO HIGH-GRADE STENOSIS. MILD DISEASE IN THE CX AND LAD SYSTEM. SEVERE LV DYSFUNCTION WITH DILATION OF THE LV. EF 15-20%. LV END-DIASTOLIC PRESSURE IS 90. +1 MR.  . CHOLECYSTECTOMY    . CYSTOSCOPY N/A 02/24/2013   Procedure: CYSTOSCOPY WITH URETHRAL DILITATION;  Surgeon: Marissa Nestle, MD;  Location: [facility];  Service: Urology;  Laterality: N/A;  . DOPPLER ECHOCARDIOGRAPHY N/A 05/30/2010   LV SIZE IS NORMAL. LV SYSTOLIC FUNCTION IS LOW NORMAL. EF=50-55%. MILD INFERIOR HYPOKINESIS.MILD TO MODERATE POSTERIOR WALL HYPOKINESIS.PACEMAKER LEAD IN THE RV. LA IS MILDLY DILATED. RA IS MODERATE TO SEVERLY DILATED. PACEMAKER LEAD IN THE RA. MILD CALCICICATION OF THE MV APPARATUS. MODERATE MR. MILD TO MODERATE TR. MILD PHTN.AV MILDLY SCLEROTIC.  [name] ESOPHAGOGASTRODUODENOSCOPY N/A 10/13/2012   Dr. Gala Romney: severe ulcerative reflux esophagitis, question of Barrett's but negative path, single deep prepyloric antral ulcer, negative H.pylori  . HERNIA REPAIR     right inguinal hernia and umbilical  . LOWETR EXT VENOUS Bilateral 11-08-10   R & L- NO EVIDENCE OF THROMBUS OR THROMBOPHLEBITIS. THERE IS MILD AMOUNT OF SUBCUTANEOUS EDEMA NOTED WITHIN THE LEFT CALF AND ANKLE. R & L GSV AND SSV- NO VENOUS INSUFF NOTED.  [name] NECK SURGERY    . NUCLEAR STRESS TEST N/A 02/13/2009   NORMAL PATTERN OF PERFUSION IN ALL REGIONS. POST STRESS VENTICULAR SIZE IS NORMAL. POST  STESS EF 85%.  NORMAL MYOCARDIAL PERFUSION STUDY.  [name] PACEMAKER INSERTION    . TONSILLECTOMY    . YAG LASER APPLICATION Bilateral 6/56/8127   Procedure: YAG LASER APPLICATION;  Surgeon: Williams Che, MD;  Location: [facility];  Service: Ophthalmology;  Laterality: Bilateral;    Prior to Admission medications   Medication Sig Start Date End Date Taking? Authorizing Provider  acetaminophen (TYLENOL) 500 MG tablet Take 500 mg by mouth every 6 (six)  hours as needed.   Yes [provider]  carvedilol (COREG) 6.25 MG tablet TAKE 1 TABLET BY MOUTH TWO  TIMES DAILY WITH A MEAL 01/13/17  Yes Croitoru, Mihai, MD  ELIQUIS 2.5 MG TABS tablet TAKE 1 TABLET BY MOUTH  TWICE A DAY 09/30/16  Yes Croitoru, Mihai, MD  furosemide (LASIX) 20 MG tablet Take 1 tablet (20 mg total) by mouth daily. 04/07/16  Yes Croitoru, Mihai, MD  Multiple Vitamins-Minerals (ICAPS AREDS 2) CAPS Take 2 capsules by mouth daily.   Yes [provider]  Polyethyl Glycol-Propyl Glycol (SYSTANE ULTRA) 0.4-0.3 % SOLN Place 1-2 drops into both eyes 2 (two) times daily as needed.    [provider]    Current Facility-Administered Medications  Medication Dose Route Frequency Provider Last Rate Last Dose  . 0.9 % NaCl with KCl 20 mEq/ L  infusion   Intravenous Continuous Tat, Shanon Brow, MD 50 mL/hr at 03/01/17 0936    . acetaminophen (TYLENOL) tablet 650 mg  650 mg Oral Q6H PRN Reubin Milan, MD      . carvedilol (COREG) tablet 6.25 mg  6.25 mg Oral BID WC Reubin Milan, MD   6.25 mg at 03/01/17 0933  . HYDROmorphone (DILAUDID) injection 0.5 mg  0.5 mg Intravenous Q4H PRN Reubin Milan, MD      . iopamidol (ISOVUE-300) 61 % injection           . ondansetron (ZOFRAN) tablet 4 mg  4 mg Oral Q6H PRN Reubin Milan, MD       Or  . ondansetron [facility]) injection 4 mg  4 mg Intravenous Q6H PRN Reubin Milan, MD      . polyvinyl alcohol (LIQUIFILM TEARS) 1.4 % ophthalmic solution 1 drop  1 drop Both Eyes BID PRN Tat, Shanon Brow, MD      . traZODone (DESYREL) tablet 25 mg  25 mg Oral QHS PRN Reubin Milan, MD        Allergies as of 02/28/2017 - Review Complete 02/28/2017  Allergen Reaction Noted  . Penicillins Hives 01/22/2011  . Sulfa antibiotics Rash 01/22/2011    Family History  Problem Relation Age of Onset  . Cancer Sister   . Asthma Sister   . Heart failure Brother   . Pulmonary embolism Brother   . Cancer Brother   . Colon  cancer Neg Hx     Social History   Social History  . Marital status: Widowed    Spouse name: N/A  . Number of children: N/A  . Years of education: N/A   Occupational History  . telephone operator Retired    retired   Social History Main Topics  . Smoking status: Former Smoker    Packs/day: 0.50    Years: 30.00    Types: Cigarettes    Quit date: 12/13/2004  . Smokeless tobacco: Never Used     Comment: former smoker  . Alcohol use 0.0 oz/week     Comment: history of drinking a 1/2 glass of wine in the evening  .  Drug use: No  . Sexual activity: No   Other Topics Concern  . Not on file   Social History Narrative  . No narrative on file    Review of Systems:  Physical Exam: Vital signs in last 24 hours: Temp:  [97.7 F (36.5 C)-97.9 F (36.6 C)] 97.7 F (36.5 C) (10/28 0522) Pulse Rate:  [70-90] 90 (10/28 0522) Resp:  [18] 18 (10/28 0522) BP: (145-150)/(70-79) 148/79 (10/28 0522) SpO2:  [95 %-100 %] 99 % (10/28 0522) Weight:  [149 lb (67.6 kg)-151 lb 11.2 oz (68.8 kg)] 151 lb 11.2 oz (68.8 kg) (10/27 2325)   General:   Alert, very pleasant and cooperative in NAD; Mouth:  No deformity or lesions, dentition normal. Neck:  Supple; no masses or thyromegaly. Lungs:  Clear throughout to auscultation.   No wheezes, crackles, or rhonchi. No acute distress. Heart:  Regular rate and rhythm; no murmurs, clicks, rubs,  or gallops. Abdomen:  Nondistended. Positive bowel sounds. Soft with minimal diffuse tenderness to palpation. No appreciable mass or organomegaly  Intake/Output from previous day: No intake/output data recorded. Intake/Output this shift: Total I/O In: 120 [P.O.:120] Out: -   Lab Results:  Recent Labs  02/26/17 1509 02/28/17 1938 03/01/17 0035 03/01/17 0608  WBC 6.8 4.5  --  5.0  HGB 13.3 13.5 12.8 12.4  HCT 39.2 39.2  --  37.1  PLT 186 153  --  157   BMET  Recent Labs  02/26/17 1509 02/28/17 1938 03/01/17 0608  NA 141 138 141  K 4.2  3.7 4.0  CL 104 104 107  CO2 23 23 23   GLUCOSE 89 98 88  BUN 20 21* 18  CREATININE 1.38* 1.30* 1.15*  CALCIUM 9.3 9.1 8.9   LFT  Recent Labs  02/28/17 1938  PROT 6.8  ALBUMIN 4.0  AST 24  ALT 14  ALKPHOS 51  BILITOT 0.6   PT/INR  Recent Labs  02/28/17 1938  LABPROT 14.8  INR 1.16     Impression:  Pleasant 83 year old lady admitted to the hospital with low volume hematochezia. She's had a modest drop her hemoglobin overnight, still in the normal range. Remains hemodynamically stable. Chronic constipation vague increased lower abdominal discomfort over the past week in association with constipation.   With abdominal cramping and bleeding, the possibility of ischemic colitis has been raised. I think this entity needs to remain in the differential. She could also have simply worsening of constipation with benign ano- rectal bleeding.  However, other possibilities would include occult colorectal cancer,  rectal ulcer syndrome, etc History of complicated GERD and peptic ulcer disease. She has ongoing reflux symptoms without any alarm features. No acid suppression therapy as an outpatient.    Recommendations:   Agree with supportive care / holding anticoagulation therapy for now.. Follow H&H. Given, complicated reflux, she should be on a PPI daily indefinitely. Further recommendations to follow pending review of CT scan.           Notice:  This dictation was prepared with Dragon dictation along with smaller phrase technology. Any transcriptional errors that result from this process are unintentional and may not be corrected upon review.

## 2017-03-02 DIAGNOSIS — K922 Gastrointestinal hemorrhage, unspecified: Secondary | ICD-10-CM | POA: Diagnosis not present

## 2017-03-02 DIAGNOSIS — K279 Peptic ulcer, site unspecified, unspecified as acute or chronic, without hemorrhage or perforation: Secondary | ICD-10-CM

## 2017-03-02 DIAGNOSIS — K625 Hemorrhage of anus and rectum: Secondary | ICD-10-CM | POA: Diagnosis not present

## 2017-03-02 DIAGNOSIS — N183 Chronic kidney disease, stage 3 (moderate): Secondary | ICD-10-CM | POA: Diagnosis not present

## 2017-03-02 DIAGNOSIS — I5032 Chronic diastolic (congestive) heart failure: Secondary | ICD-10-CM | POA: Diagnosis not present

## 2017-03-02 DIAGNOSIS — Z7901 Long term (current) use of anticoagulants: Secondary | ICD-10-CM | POA: Diagnosis not present

## 2017-03-02 LAB — BASIC METABOLIC PANEL
ANION GAP: 7 (ref 5–15)
BUN: 13 mg/dL (ref 6–20)
CO2: 24 mmol/L (ref 22–32)
CREATININE: 1.17 mg/dL — AB (ref 0.44–1.00)
Calcium: 8.8 mg/dL — ABNORMAL LOW (ref 8.9–10.3)
Chloride: 105 mmol/L (ref 101–111)
GFR calc Af Amer: 47 mL/min — ABNORMAL LOW (ref >60)
GFR, EST NON AFRICAN AMERICAN: 40 mL/min — AB (ref >60)
Glucose, Bld: 84 mg/dL (ref 65–99)
Potassium: 4.3 mmol/L (ref 3.5–5.1)
SODIUM: 136 mmol/L (ref 135–145)

## 2017-03-02 LAB — CBC
HCT: 36.5 % (ref 36.0–46.0)
Hemoglobin: 12.1 g/dL (ref 12.0–15.0)
MCH: 34.2 pg — ABNORMAL HIGH (ref 26.0–34.0)
MCHC: 33.2 g/dL (ref 30.0–36.0)
MCV: 103.1 fL — ABNORMAL HIGH (ref 78.0–100.0)
PLATELETS: 155 10*3/uL (ref 150–400)
RBC: 3.54 MIL/uL — ABNORMAL LOW (ref 3.87–5.11)
RDW: 14.2 % (ref 11.5–15.5)
WBC: 4.7 10*3/uL (ref 4.0–10.5)

## 2017-03-02 LAB — MAGNESIUM: Magnesium: 1.8 mg/dL (ref 1.7–2.4)

## 2017-03-02 LAB — TROPONIN I

## 2017-03-02 LAB — VITAMIN B12: VITAMIN B 12: 231 pg/mL (ref 180–914)

## 2017-03-02 LAB — FOLATE: Folate: 11 ng/mL (ref >5.9)

## 2017-03-02 MED ORDER — PANTOPRAZOLE SODIUM 40 MG PO TBEC
40.0000 mg | DELAYED_RELEASE_TABLET | Freq: Every day | ORAL | Status: DC
  Administered 2017-03-02 – 2017-03-05 (×4): 40 mg via ORAL
  Filled 2017-03-02 (×4): qty 1

## 2017-03-02 MED ORDER — PEG 3350-KCL-NA BICARB-NACL 420 G PO SOLR
2000.0000 mL | Freq: Once | ORAL | Status: AC
  Administered 2017-03-03: 2000 mL via ORAL

## 2017-03-02 MED ORDER — PEG 3350-KCL-NA BICARB-NACL 420 G PO SOLR
2000.0000 mL | Freq: Once | ORAL | Status: AC
  Administered 2017-03-02: 2000 mL via ORAL
  Filled 2017-03-02: qty 4000

## 2017-03-02 MED ORDER — SALINE SPRAY 0.65 % NA SOLN
1.0000 | NASAL | Status: DC | PRN
  Administered 2017-03-02: 1 via NASAL
  Filled 2017-03-02: qty 44

## 2017-03-02 MED ORDER — GI COCKTAIL ~~LOC~~
30.0000 mL | Freq: Once | ORAL | Status: AC
  Administered 2017-03-02: 30 mL via ORAL
  Filled 2017-03-02: qty 30

## 2017-03-02 NOTE — Care Management Obs Status (Signed)
[facility] NOTIFICATION   Patient Details  Name: Kimberly [name] MRN: [id number] Date of Birth: 1927-04-19   Medicare Observation Status Notification Given:  Yes    Arriana Lohmann, Chauncey Reading, RN 03/02/2017, 10:51 AM

## 2017-03-02 NOTE — Progress Notes (Signed)
Subjective: No blood with episode of diarrhea this morning. Abdominal pain noted lower abdomen for several months. Doesn't hurt after eating. Movement will sometimes trigger the discomfort. Feels like someone is poking her. Pain will be a 10/10 at its worst. Doesn't last very long. Being still helps to feel better. No weight loss. Actually endorses weight gain. No fear of eating.   Objective: Vital signs in last 24 hours: Temp:  [97.6 F (36.4 C)-97.8 F (36.6 C)] 97.8 F (36.6 C) (10/29 0300) Pulse Rate:  [71-87] 87 (10/29 0300) Resp:  [18] 18 (10/29 0300) BP: (122-142)/(51-76) 126/72 (10/29 0300) SpO2:  [98 %-100 %] 98 % (10/29 0300) Weight:  [156 lb 9.6 oz (71 kg)] 156 lb 9.6 oz (71 kg) (10/29 0300) Last BM Date: 03/01/17 General:   Alert and oriented, pleasant Head:  Normocephalic and atraumatic. Eyes:  No icterus, sclera clear. Conjuctiva pink.  Abdomen:  Bowel sounds present, soft, mild TTP RLQ , non-distended. No HSM or hernias noted. No rebound or guarding. No masses appreciated  Msk:  Symmetrical without gross deformities. Normal posture. Extremities:  Without  edema. Neurologic:  Alert and  oriented x4 Psych:  Alert and cooperative. Normal mood and affect.  Intake/Output from previous day: 10/28 0701 - 10/29 0700 In: 882.5 [P.O.:600; I.V.:282.5] Out: -  Intake/Output this shift: No intake/output data recorded.  Lab Results:  Recent Labs  02/28/17 1938  03/01/17 0608 03/01/17 1331 03/02/17 0444  WBC 4.5  --  5.0  --  4.7  HGB 13.5  < > 12.4 12.5 12.1  HCT 39.2  --  37.1  --  36.5  PLT 153  --  157  --  155  < > = values in this interval not displayed. BMET  Recent Labs  02/28/17 1938 03/01/17 0608 03/02/17 0444  NA 138 141 136  K 3.7 4.0 4.3  CL 104 107 105  CO2 23 23 24   GLUCOSE 98 88 84  BUN 21* 18 13  CREATININE 1.30* 1.15* 1.17*  CALCIUM 9.1 8.9 8.8*   LFT  Recent Labs  02/28/17 1938  PROT 6.8  ALBUMIN 4.0  AST 24  ALT 14   ALKPHOS 51  BILITOT 0.6   PT/INR  Recent Labs  02/28/17 1938  LABPROT 14.8  INR 1.16    Studies/Results: Ct Abdomen Pelvis Wo Contrast  Result Date: 03/01/2017 CLINICAL DATA:  Oral only Abdominal pain with rectal bleeding Hx of htn,CKD III, hysterectomy,appendectomy/bbj EXAM: CT ABDOMEN AND PELVIS WITHOUT CONTRAST TECHNIQUE: Multidetector CT imaging of the abdomen and pelvis was performed following the standard protocol without IV contrast. COMPARISON:  06/24/2016 FINDINGS: Lower chest: Coronary and aortic calcifications. Transvenous pacing leads partially visualized. No pleural or pericardial effusion. Small hiatal hernia. Visualized lung bases clear. Hepatobiliary: Stable cysts in segments 3, 4B, and 6. No new liver abnormality is seen. Status post cholecystectomy. No biliary dilatation. Pancreas: Unremarkable. No pancreatic ductal dilatation or surrounding inflammatory changes. Spleen: Normal in size without focal abnormality. Adrenals/Urinary Tract: Small calcifications in the right renal collecting system, largest stone or cluster approximately 0.4 cm. No hydronephrosis. No ureterectasis. Urinary bladder is incompletely distended. Stomach/Bowel: Hiatal hernia. Stomach is nondilated. Small bowel is nondilated. Appendix reportedly surgically absent. The colon is nondistended. Multiple diverticula from distal descending and sigmoid portions of the colon, without adjacent inflammatory/edematous change or abscess. Vascular/Lymphatic: Heavy calcified plaque through the visualized distal descending thoracic and abdominal aorta without aneurysm. Extensive visceral and renal arterial calcified plaque. Patchy bilateral iliofemoral arterial  plaque. No abdominal or pelvic adenopathy localized. Reproductive: Status post hysterectomy. No adnexal masses. Other: No ascites.  No free air. Musculoskeletal: Moderately severe thoracolumbar dextroscoliosis apex L1-2 with multilevel spondylitic changes throughout  the lumbar spine. Chronic T12 compression deformity. No acute fracture or worrisome bone lesion. IMPRESSION: 1. No acute findings. 2. Right nephrolithiasis without hydronephrosis. 3. Hiatal hernia 4. Descending and sigmoid diverticulosis. 5. Extensive aortoiliac, coronary, visceral and renal artery calcified atheromatous plaque. 6. Lumbar scoliosis, multilevel spondylitic change, and chronic T12 compression deformity. Electronically Signed   By: Lucrezia Europe M.D.   On: 03/01/2017 14:28   Dg Chest 2 View  Result Date: 03/01/2017 CLINICAL DATA:  atrial fibrillation, diverticulosis, chronic diastolic CHF, essential hypertension, fibromyalgia, CKD stage III, complete heart block status post permanent pacemaker presented with one-week history of intermittent rectal bleeding that had worsened over the past 2-3 daysHISTORY OF CHF, HTN EXAM: CHEST - 2 VIEW COMPARISON:  06/24/2016 FINDINGS: Lungs hyperinflated, clear. Heart size upper limits normal. Tortuous atheromatous thoracic aorta. Stable left subclavian AICD. No effusion.  No pneumothorax. Diffuse osteopenia. Compression deformity near the thoracolumbar junction, present since 06/24/2016. IMPRESSION: 1. Stable borderline cardiomegaly.  No acute disease. 2.  Aortic Atherosclerosis (ICD10-170.0) Electronically Signed   By: Lucrezia Europe M.D.   On: 03/01/2017 14:54   US Venous Img Lower Bilateral  Result Date: 03/01/2017 CLINICAL DATA:  Pain at rest x months. EXAM: BILATERAL LOWER EXTREMITY VENOUS DOPPLER ULTRASOUND TECHNIQUE: Gray-scale sonography with compression, as well as color and duplex ultrasound, were performed to evaluate the deep venous system from the level of the common femoral vein through the popliteal and proximal calf veins. COMPARISON:  None FINDINGS: Normal compressibility of the common femoral, superficial femoral, and popliteal veins, as well as the proximal calf veins. No filling defects to suggest DVT on grayscale or color Doppler imaging.  Doppler waveforms show normal direction of venous flow, normal respiratory phasicity and response to augmentation. Visualized segments of the saphenous venous systems normal in caliber and compressibility. IMPRESSION: No evidence of  lower extremity deep vein thrombosis, bilaterally. Electronically Signed   By: Lucrezia Europe M.D.   On: 03/01/2017 14:03    Assessment: 83 year old female admitted with low-volume hematochezia in setting of chronic anticoagulation, hemodynamically stable. Differentials included benign anorectal source, ischemic colitis, unable to exclude other etiologies such as rectal ulcer, colorectal cancer. Last colonoscopy in remote past at [facility]. No further rectal bleeding.   Hgb without significant drop. 12.1 this morning. CT abd/pelvis WITHOUT contrast without acute findings; however, extensive atherosclerotic disease noted. With non-contrast CT, would not be able to comment specifically on mesenteric vasculature. Due to chronic kidney disease, imaging modalities of vasculature limited.   Abdominal pain: Does not seem to have classic symptoms of chronic mesenteric ischemia as she notes weight gain, good appetite, no pain with eating. Unclear etiology of lower abdominal pain; however, she does note improvement with defecation. Chronic constipation could be playing a role. One episode of loose stool this morning. Will follow  GERD: PPI. No alarm symptoms  Plan: Continue to monitor for further bleeding PPI daily Follow H/H Monitor for further loose stool: may have been isolated incidence this morning Will advance to soft diet  Outpatient management of constipation with Linzess or Amitiza Consider outpatient colonoscopy Further imaging modalities to discuss with radiology if indicated   Annitta Needs, PhD, ANP-BC [facility]    LOS: 0 days    03/02/2017, 8:12 AM

## 2017-03-02 NOTE — Progress Notes (Signed)
PROGRESS NOTE  Kimberly [name] GDJ:[id number] DOB: [birth year] DOA: 02/28/2017 PCP: Wardell Honour, MD Brief History:  83 year old female with a history of permanent atrial fibrillation, diverticulosis, chronic diastolic CHF, essential hypertension, fibromyalgia, CKD stage III, complete heart block status post permanent pacemaker presented with one-week history of intermittent rectal bleeding that had worsened over the past 2-3 days.  The patient states that she normally struggles with constipation, and she states that in the past week she has had to strain more to have bowel movement.  Associated with this, the patient has had some worsening of her rectal bleeding and abdominal pain.  She also has a sensation of incomplete evacuation.  She stated that her abdominal pain has been intermittent this past week, but has become more constant in the past 2-3 days prior to admission.  She denies any fevers, chills, chest pain, nausea, vomiting, diarrhea.  However she endorses some dyspnea on exertion which she states has been the same as usual.  Upon presentation, the patient was afebrile hemodynamically stable with hemoglobin of 13.5.  Her last apixaban dose was on the morning of February 28, 2017.  Assessment/Plan: Hematochezia/abdominal pain -Concerned about ischemic colitis vs diverticular bleed -no further bleeding in last 24 hours -CT abdomen and pelvis--diverticulosis of the descending and sigmoid colon, right nephrolithiasis without hydronephrosis, multilevel spondylitic changes in the lumbar spine.  T12 chronic compression deformity. -GI consult appreciated -Holding apixaban---restart when okay with GI -Clear liquid diet>>>soft diet -Trend hemoglobin-->stable -Judicious IV fluids -October 13, 2012 EGD--severe ulcerative reflux esophagitis, gastric erosions with a single antral ulcer -constipation management per GI  Heartburn/Atypical Chest pain -EKG  -cycle troponins -GI  cocktail  Chronic diastolic CHF -Daily weights -Continue home dose furosemide -November 28, 2014 Echo EF 60-65% -Continue carvedilol   Permanent atrial fibrillation -Rate controlled -holding apixaban in the setting of rectal bleeding -Status post AV node ablation-->CHB-->PPM -continue carvedilol  Dyspnea on exertion -Chest x-ray--neg -100% on RA  CKD stage III -Baseline creatinine 1.2-1.6 -A.m. BMP  Essential hypertension -Continue carvedilol  Complete heart block -Status post permanent pacemaker January 2012  Lower extremity pain and edema -venous duplex--neg   Disposition Plan:   Home 10/30 if okay with GI Family Communication:   No Family at bedside  Consultants:  GI  Code Status:  FULL   DVT Prophylaxis:  SCD   Procedures: As Listed in Progress Note Above  Antibiotics: None       Subjective: Patient states that hematochezia has improved.  She has not had any rectal bleeding in the last 24 hours.  She states the abdominal pain is improved.  Denies any shortness of breath, nausea, vomiting, dysuria, hematuria.  she has some heartburn.   Objective: Vitals:   03/01/17 1500 03/01/17 2002 03/02/17 0300 03/02/17 0800  BP: (!) 142/76 (!) 122/51 126/72 129/69  Pulse: 71 71 87 68  Resp: 18 18 18 16   Temp: 97.6 F (36.4 C) 97.6 F (36.4 C) 97.8 F (36.6 C) 97.6 F (36.4 C)  TempSrc: Oral Oral Oral Oral  SpO2: 99% 100% 98% 99%  Weight:   71 kg (156 lb 9.6 oz)   Height:   5\' 5"  (1.651 m)     Intake/Output Summary (Last 24 hours) at 03/02/17 1232 Last data filed at 03/01/17 1700  Gross per 24 hour  Intake            762.5 ml  Output  0 ml  Net            762.5 ml   Weight change: 3.447 kg (7 lb 9.6 oz) Exam:   General:  Pt is alert, follows commands appropriately, not in acute distress  HEENT: No icterus, No thrush, No neck mass, [place]/AT  Cardiovascular: IRRR, S1/S2, no rubs, no gallops  Respiratory: CTA  bilaterally, no wheezing, no crackles, no rhonchi  Abdomen: Soft/+BS, non tender, non distended, no guarding  Extremities: No edema, No lymphangitis, No petechiae, No rashes, no synovitis   Data Reviewed: I have personally reviewed following labs and imaging studies Basic Metabolic Panel:  Recent Labs Lab 02/26/17 1509 02/28/17 1938 03/01/17 0608 03/02/17 0444  NA 141 138 141 136  K 4.2 3.7 4.0 4.3  CL 104 104 107 105  CO2 23 23 23 24   GLUCOSE 89 98 88 84  BUN 20 21* 18 13  CREATININE 1.38* 1.30* 1.15* 1.17*  CALCIUM 9.3 9.1 8.9 8.8*  MG  --   --   --  1.8   Liver Function Tests:  Recent Labs Lab 02/26/17 1509 02/28/17 1938  AST 20 24  ALT 10 14  ALKPHOS 57 51  BILITOT 0.5 0.6  PROT 6.7 6.8  ALBUMIN 4.3 4.0    Recent Labs Lab 02/28/17 1938  LIPASE 37   No results for input(s): AMMONIA in the last 168 hours. Coagulation Profile:  Recent Labs Lab 02/28/17 1938  INR 1.16   CBC:  Recent Labs Lab 02/26/17 1509 02/28/17 1938 03/01/17 0035 03/01/17 0608 03/01/17 1331 03/02/17 0444  WBC 6.8 4.5  --  5.0  --  4.7  NEUTROABS 4.8 2.9  --   --   --   --   HGB 13.3 13.5 12.8 12.4 12.5 12.1  HCT 39.2 39.2  --  37.1  --  36.5  MCV 102* 101.0*  --  101.4*  --  103.1*  PLT 186 153  --  157  --  155   Cardiac Enzymes: No results for input(s): CKTOTAL, CKMB, CKMBINDEX, TROPONINI in the last 168 hours. BNP: Invalid input(s): POCBNP CBG: No results for input(s): GLUCAP in the last 168 hours. HbA1C: No results for input(s): HGBA1C in the last 72 hours. Urine analysis:    Component Value Date/Time   COLORURINE STRAW (A) 02/28/2017 1952   APPEARANCEUR HAZY (A) 02/28/2017 1952   LABSPEC 1.006 02/28/2017 1952   PHURINE 6.0 02/28/2017 1952   GLUCOSEU NEGATIVE 02/28/2017 1952   HGBUR NEGATIVE 02/28/2017 [place] NEGATIVE 02/28/2017 1952   BILIRUBINUR neg 04/25/2014 [place] 02/28/2017 1952   PROTEINUR NEGATIVE 02/28/2017 1952    UROBILINOGEN 0.2 05/26/2014 1415   NITRITE NEGATIVE 02/28/2017 1952   LEUKOCYTESUR SMALL (A) 02/28/2017 1952   Sepsis Labs: @LABRCNTIP (procalcitonin:4,lacticidven:4) )No results found for this or any previous visit (from the past 240 hour(s)).   Scheduled Meds: . carvedilol  6.25 mg Oral BID WC  . pantoprazole  40 mg Oral Daily   Continuous Infusions:  Procedures/Studies: Ct Abdomen Pelvis Wo Contrast  Result Date: 03/01/2017 CLINICAL DATA:  Oral only Abdominal pain with rectal bleeding Hx of htn,CKD III, hysterectomy,appendectomy/bbj EXAM: CT ABDOMEN AND PELVIS WITHOUT CONTRAST TECHNIQUE: Multidetector CT imaging of the abdomen and pelvis was performed following the standard protocol without IV contrast. COMPARISON:  06/24/2016 FINDINGS: Lower chest: Coronary and aortic calcifications. Transvenous pacing leads partially visualized. No pleural or pericardial effusion. Small hiatal hernia. Visualized lung bases clear. Hepatobiliary: Stable cysts in  segments 3, 4B, and 6. No new liver abnormality is seen. Status post cholecystectomy. No biliary dilatation. Pancreas: Unremarkable. No pancreatic ductal dilatation or surrounding inflammatory changes. Spleen: Normal in size without focal abnormality. Adrenals/Urinary Tract: Small calcifications in the right renal collecting system, largest stone or cluster approximately 0.4 cm. No hydronephrosis. No ureterectasis. Urinary bladder is incompletely distended. Stomach/Bowel: Hiatal hernia. Stomach is nondilated. Small bowel is nondilated. Appendix reportedly surgically absent. The colon is nondistended. Multiple diverticula from distal descending and sigmoid portions of the colon, without adjacent inflammatory/edematous change or abscess. Vascular/Lymphatic: Heavy calcified plaque through the visualized distal descending thoracic and abdominal aorta without aneurysm. Extensive visceral and renal arterial calcified plaque. Patchy bilateral iliofemoral  arterial plaque. No abdominal or pelvic adenopathy localized. Reproductive: Status post hysterectomy. No adnexal masses. Other: No ascites.  No free air. Musculoskeletal: Moderately severe thoracolumbar dextroscoliosis apex L1-2 with multilevel spondylitic changes throughout the lumbar spine. Chronic T12 compression deformity. No acute fracture or worrisome bone lesion. IMPRESSION: 1. No acute findings. 2. Right nephrolithiasis without hydronephrosis. 3. Hiatal hernia 4. Descending and sigmoid diverticulosis. 5. Extensive aortoiliac, coronary, visceral and renal artery calcified atheromatous plaque. 6. Lumbar scoliosis, multilevel spondylitic change, and chronic T12 compression deformity. Electronically Signed   By: Lucrezia Europe M.D.   On: 03/01/2017 14:28   Dg Chest 2 View  Result Date: 03/01/2017 CLINICAL DATA:  atrial fibrillation, diverticulosis, chronic diastolic CHF, essential hypertension, fibromyalgia, CKD stage III, complete heart block status post permanent pacemaker presented with one-week history of intermittent rectal bleeding that had worsened over the past 2-3 daysHISTORY OF CHF, HTN EXAM: CHEST - 2 VIEW COMPARISON:  06/24/2016 FINDINGS: Lungs hyperinflated, clear. Heart size upper limits normal. Tortuous atheromatous thoracic aorta. Stable left subclavian AICD. No effusion.  No pneumothorax. Diffuse osteopenia. Compression deformity near the thoracolumbar junction, present since 06/24/2016. IMPRESSION: 1. Stable borderline cardiomegaly.  No acute disease. 2.  Aortic Atherosclerosis (ICD10-170.0) Electronically Signed   By: Lucrezia Europe M.D.   On: 03/01/2017 14:54   US Venous Img Lower Bilateral  Result Date: 03/01/2017 CLINICAL DATA:  Pain at rest x months. EXAM: BILATERAL LOWER EXTREMITY VENOUS DOPPLER ULTRASOUND TECHNIQUE: Gray-scale sonography with compression, as well as color and duplex ultrasound, were performed to evaluate the deep venous system from the level of the common femoral vein  through the popliteal and proximal calf veins. COMPARISON:  None FINDINGS: Normal compressibility of the common femoral, superficial femoral, and popliteal veins, as well as the proximal calf veins. No filling defects to suggest DVT on grayscale or color Doppler imaging. Doppler waveforms show normal direction of venous flow, normal respiratory phasicity and response to augmentation. Visualized segments of the saphenous venous systems normal in caliber and compressibility. IMPRESSION: No evidence of  lower extremity deep vein thrombosis, bilaterally. Electronically Signed   By: Lucrezia Europe M.D.   On: 03/01/2017 14:03    Stoney Karczewski, DO  [facility] Pager [phone]  If 7PM-7AM, please contact night-coverage [email or web address] Password [email or web address] 03/02/2017, 12:32 PM   LOS: 0 days

## 2017-03-03 ENCOUNTER — Encounter (HOSPITAL_COMMUNITY): Payer: Self-pay | Admitting: *Deleted

## 2017-03-03 ENCOUNTER — Encounter (HOSPITAL_COMMUNITY)
Admission: EM | Disposition: A | Discharge status: Home / Self Care | Payer: Self-pay | Source: Home / Self Care | Attending: Emergency Medicine

## 2017-03-03 DIAGNOSIS — K922 Gastrointestinal hemorrhage, unspecified: Secondary | ICD-10-CM | POA: Diagnosis not present

## 2017-03-03 DIAGNOSIS — Z7901 Long term (current) use of anticoagulants: Secondary | ICD-10-CM | POA: Diagnosis not present

## 2017-03-03 DIAGNOSIS — D122 Benign neoplasm of ascending colon: Secondary | ICD-10-CM

## 2017-03-03 DIAGNOSIS — K2990 Gastroduodenitis, unspecified, without bleeding: Secondary | ICD-10-CM

## 2017-03-03 DIAGNOSIS — K621 Rectal polyp: Secondary | ICD-10-CM | POA: Diagnosis not present

## 2017-03-03 DIAGNOSIS — Z13810 Encounter for screening for upper gastrointestinal disorder: Secondary | ICD-10-CM | POA: Diagnosis not present

## 2017-03-03 DIAGNOSIS — K921 Melena: Secondary | ICD-10-CM | POA: Diagnosis not present

## 2017-03-03 DIAGNOSIS — D123 Benign neoplasm of transverse colon: Secondary | ICD-10-CM

## 2017-03-03 DIAGNOSIS — K625 Hemorrhage of anus and rectum: Secondary | ICD-10-CM | POA: Diagnosis not present

## 2017-03-03 DIAGNOSIS — D125 Benign neoplasm of sigmoid colon: Secondary | ICD-10-CM | POA: Diagnosis not present

## 2017-03-03 DIAGNOSIS — K2970 Gastritis, unspecified, without bleeding: Secondary | ICD-10-CM

## 2017-03-03 DIAGNOSIS — Z8711 Personal history of peptic ulcer disease: Secondary | ICD-10-CM | POA: Diagnosis not present

## 2017-03-03 DIAGNOSIS — I5032 Chronic diastolic (congestive) heart failure: Secondary | ICD-10-CM | POA: Diagnosis not present

## 2017-03-03 DIAGNOSIS — N183 Chronic kidney disease, stage 3 (moderate): Secondary | ICD-10-CM | POA: Diagnosis not present

## 2017-03-03 HISTORY — PX: ESOPHAGOGASTRODUODENOSCOPY: SHX5428

## 2017-03-03 HISTORY — PX: POLYPECTOMY: SHX5525

## 2017-03-03 HISTORY — PX: COLONOSCOPY: SHX5424

## 2017-03-03 HISTORY — PX: BIOPSY: SHX5522

## 2017-03-03 LAB — CBC
HCT: 36.8 % (ref 36.0–46.0)
HEMOGLOBIN: 12.1 g/dL (ref 12.0–15.0)
MCH: 33.9 pg (ref 26.0–34.0)
MCHC: 32.9 g/dL (ref 30.0–36.0)
MCV: 103.1 fL — ABNORMAL HIGH (ref 78.0–100.0)
PLATELETS: 151 10*3/uL (ref 150–400)
RBC: 3.57 MIL/uL — AB (ref 3.87–5.11)
RDW: 14.1 % (ref 11.5–15.5)
WBC: 5.8 10*3/uL (ref 4.0–10.5)

## 2017-03-03 LAB — BASIC METABOLIC PANEL
ANION GAP: 7 (ref 5–15)
BUN: 17 mg/dL (ref 6–20)
CHLORIDE: 111 mmol/L (ref 101–111)
CO2: 22 mmol/L (ref 22–32)
Calcium: 8.6 mg/dL — ABNORMAL LOW (ref 8.9–10.3)
Creatinine, Ser: 1.27 mg/dL — ABNORMAL HIGH (ref 0.44–1.00)
GFR, EST AFRICAN AMERICAN: 42 mL/min — AB (ref >60)
GFR, EST NON AFRICAN AMERICAN: 37 mL/min — AB (ref >60)
Glucose, Bld: 87 mg/dL (ref 65–99)
POTASSIUM: 4.1 mmol/L (ref 3.5–5.1)
SODIUM: 140 mmol/L (ref 135–145)

## 2017-03-03 SURGERY — COLONOSCOPY
Anesthesia: Moderate Sedation

## 2017-03-03 MED ORDER — LIDOCAINE VISCOUS 2 % MT SOLN
OROMUCOSAL | Status: DC | PRN
  Administered 2017-03-03: 4 mL via OROMUCOSAL

## 2017-03-03 MED ORDER — FUROSEMIDE 20 MG PO TABS
20.0000 mg | ORAL_TABLET | Freq: Every day | ORAL | Status: DC
  Administered 2017-03-05: 20 mg via ORAL
  Filled 2017-03-03 (×2): qty 1

## 2017-03-03 MED ORDER — MIDAZOLAM HCL 5 MG/5ML IJ SOLN
INTRAMUSCULAR | Status: DC | PRN
  Administered 2017-03-03 (×3): 1 mg via INTRAVENOUS
  Administered 2017-03-03: 2 mg via INTRAVENOUS
  Administered 2017-03-03: 1 mg via INTRAVENOUS

## 2017-03-03 MED ORDER — STERILE WATER FOR IRRIGATION IR SOLN
Status: DC | PRN
  Administered 2017-03-03: 14:00:00

## 2017-03-03 MED ORDER — SODIUM CHLORIDE 0.9 % IV SOLN
INTRAVENOUS | Status: DC
  Administered 2017-03-03: 13:00:00 via INTRAVENOUS

## 2017-03-03 MED ORDER — MIDAZOLAM HCL 5 MG/5ML IJ SOLN
INTRAMUSCULAR | Status: AC
  Filled 2017-03-03: qty 10

## 2017-03-03 MED ORDER — APIXABAN 2.5 MG PO TABS
2.5000 mg | ORAL_TABLET | Freq: Two times a day (BID) | ORAL | Status: DC
  Administered 2017-03-04 – 2017-03-05 (×2): 2.5 mg via ORAL
  Filled 2017-03-03 (×3): qty 1

## 2017-03-03 MED ORDER — FENTANYL CITRATE (PF) 100 MCG/2ML IJ SOLN
INTRAMUSCULAR | Status: AC
  Filled 2017-03-03: qty 2

## 2017-03-03 MED ORDER — MEPERIDINE HCL 100 MG/ML IJ SOLN
INTRAMUSCULAR | Status: AC
  Filled 2017-03-03: qty 2

## 2017-03-03 MED ORDER — FENTANYL CITRATE (PF) 100 MCG/2ML IJ SOLN
INTRAMUSCULAR | Status: DC | PRN
  Administered 2017-03-03 (×3): 25 ug via INTRAVENOUS

## 2017-03-03 MED ORDER — OXYCODONE HCL 5 MG PO TABS
5.0000 mg | ORAL_TABLET | ORAL | Status: DC | PRN
Start: 2017-03-03 — End: 2017-03-05
  Administered 2017-03-03: 5 mg via ORAL
  Filled 2017-03-03 (×2): qty 1

## 2017-03-03 MED ORDER — LIDOCAINE VISCOUS 2 % MT SOLN
OROMUCOSAL | Status: AC
  Filled 2017-03-03: qty 15

## 2017-03-03 MED ORDER — PANTOPRAZOLE SODIUM 40 MG PO TBEC: 40.0000 mg | DELAYED_RELEASE_TABLET | Freq: Every day | ORAL | 1 refills | Status: DC

## 2017-03-03 NOTE — Progress Notes (Signed)
OK given by Dr. Carles Collet to give patient systane eyedrops from home PRN

## 2017-03-03 NOTE — Progress Notes (Signed)
Patient presented with rectal bleeding and worsening constipation. When I saw her yesterday, she had a large liquid BM. Doubt infectious process as she came in with constipation. I note that stool studies were ordered yesterday, but she has been prepping for the colonoscopy since yesterday afternoon. Per nursing staff, prep was adequate. Receiving tap water enemas this morning.   Eliquis has been on hold since day of admission, 10/27. Patient desires  Inpatient colonoscopy due to transportation difficulties, so this has been arranged for today with Dr. Oneida Alar. Anticipate discharge home in near future. She has remained hemodynamically stable without significant change in Hgb.   Annitta Needs, PhD, ANP-BC [facility]

## 2017-03-03 NOTE — H&P (Addendum)
Referring Provider: No ref. provider found Primary Care Physician:  Wardell Honour, MD Primary Gastroenterologist:  Barney Drain   Impression: Admitted with rectal bleedinG ON ELIQUIS. BP/Hb STABLE.  Plan: 1. TCS TODAY. DISCUSSED PROCEDURE, BENEFITS, & RISKS: < 1% chance of medication reaction, bleeding, perforation, or rupture of spleen/liver.    HPI:   Past Medical History:  Diagnosis Date  . Acute blood loss anemia 10/11/2012  . Acute diverticulitis 08/24/2013  . Acute on chronic combined systolic and diastolic CHF, NYHA class 4 ([place]) 11/15/2013  . Antral ulcer 10/11/2012  . Arrhythmia    atrial fibb  . Atrial fibrillation ([place])   . Cardiomyopathy, nonischemic ([place])   . Chronic anticoagulation 10/12/2012  . CKD (chronic kidney disease) stage 3, GFR 30-59 ml/min ([facility]) 10/12/2012  . Depression   . Erosive esophagitis 10/11/2012  . Fibromyalgia   . Glaucoma   . H/O echocardiogram 2007   EF 40-45%,         . Hypertension   . Osteoarthritis   . Pacemaker    Last saw cards 07/2013  . Scoliosis     Past Surgical History:  Procedure Laterality Date  . ABDOMINAL HYSTERECTOMY    . APPENDECTOMY    . BACK SURGERY    . BREAST SURGERY    . CARDIAC CATHETERIZATION  12/08/2005   LAD AND LEFT MAIN WITH NO HIGH-GRADE STENOSIS. MILD DISEASE IN THE CX AND LAD SYSTEM. SEVERE LV DYSFUNCTION WITH DILATION OF THE LV. EF 15-20%. LV END-DIASTOLIC PRESSURE IS 90. +1 MR.  . CHOLECYSTECTOMY    . CYSTOSCOPY N/A 02/24/2013   Procedure: CYSTOSCOPY WITH URETHRAL DILITATION;  Surgeon: Marissa Nestle, MD;  Location: [facility];  Service: Urology;  Laterality: N/A;  . DOPPLER ECHOCARDIOGRAPHY N/A 05/30/2010   LV SIZE IS NORMAL. LV SYSTOLIC FUNCTION IS LOW NORMAL. EF=50-55%. MILD INFERIOR HYPOKINESIS.MILD TO MODERATE POSTERIOR WALL HYPOKINESIS.PACEMAKER LEAD IN THE RV. LA IS MILDLY DILATED. RA IS MODERATE TO SEVERLY DILATED. PACEMAKER LEAD IN THE RA. MILD CALCICICATION OF THE MV APPARATUS. MODERATE MR. MILD TO  MODERATE TR. MILD PHTN.AV MILDLY SCLEROTIC.  [name] ESOPHAGOGASTRODUODENOSCOPY N/A 10/13/2012   Dr. Gala Romney: severe ulcerative reflux esophagitis, question of Barrett's but negative path, single deep prepyloric antral ulcer, negative H.pylori  . HERNIA REPAIR     right inguinal hernia and umbilical  . LOWETR EXT VENOUS Bilateral 11-08-10   R & L- NO EVIDENCE OF THROMBUS OR THROMBOPHLEBITIS. THERE IS MILD AMOUNT OF SUBCUTANEOUS EDEMA NOTED WITHIN THE LEFT CALF AND ANKLE. R & L GSV AND SSV- NO VENOUS INSUFF NOTED.  [name] NECK SURGERY    . NUCLEAR STRESS TEST N/A 02/13/2009   NORMAL PATTERN OF PERFUSION IN ALL REGIONS. POST STRESS VENTICULAR SIZE IS NORMAL. POST  STESS EF 85%.  NORMAL MYOCARDIAL PERFUSION STUDY.  [name] PACEMAKER INSERTION    . TONSILLECTOMY    . YAG LASER APPLICATION Bilateral 8/84/1660   Procedure: YAG LASER APPLICATION;  Surgeon: Williams Che, MD;  Location: [facility];  Service: Ophthalmology;  Laterality: Bilateral;    Prior to Admission medications   Medication Sig Start Date End Date Taking? Authorizing Provider  acetaminophen (TYLENOL) 500 MG tablet Take 500 mg by mouth every 6 (six) hours as needed.   Yes [provider]  carvedilol (COREG) 6.25 MG tablet TAKE 1 TABLET BY MOUTH TWO  TIMES DAILY WITH A MEAL 01/13/17  Yes Croitoru, Mihai, MD  ELIQUIS 2.5 MG TABS tablet TAKE 1 TABLET BY MOUTH  TWICE A DAY 09/30/16  Yes Croitoru, Mihai, MD  furosemide (LASIX) 20 MG tablet Take 1 tablet (20 mg total) by mouth daily. 04/07/16  Yes Croitoru, Mihai, MD  Multiple Vitamins-Minerals (ICAPS AREDS 2) CAPS Take 2 capsules by mouth daily.   Yes [provider]  Polyethyl Glycol-Propyl Glycol (SYSTANE ULTRA) 0.4-0.3 % SOLN Place 1-2 drops into both eyes 2 (two) times daily as needed.    [provider]    Current Facility-Administered Medications  Medication Dose Route Frequency Provider Last Rate Last Dose  . 0.9 %  sodium chloride infusion   Intravenous Continuous Annitta Needs, NP      . Doug Sou Hold] acetaminophen (TYLENOL) tablet 650 mg  650 mg Oral Q6H PRN Reubin Milan, MD   650 mg at 03/03/17 0546  . [MAR Hold] carvedilol (COREG) tablet 6.25 mg  6.25 mg Oral BID WC Reubin Milan, MD   6.25 mg at 03/03/17 4270  . [MAR Hold] HYDROmorphone (DILAUDID) injection 0.5 mg  0.5 mg Intravenous Q4H PRN Reubin Milan, MD   0.5 mg at 03/02/17 2243  . meperidine (DEMEROL) 100 MG/ML injection           . midazolam (VERSED) 5 MG/5ML injection           . [MAR Hold] ondansetron (ZOFRAN) tablet 4 mg  4 mg Oral Q6H PRN Reubin Milan, MD       Or  . Doug Sou Hold] ondansetron [facility]) injection 4 mg  4 mg Intravenous Q6H PRN Reubin Milan, MD      . Doug Sou Hold] pantoprazole (PROTONIX) EC tablet 40 mg  40 mg Oral Daily Annitta Needs, NP   40 mg at 03/03/17 0551  . [MAR Hold] polyvinyl alcohol (LIQUIFILM TEARS) 1.4 % ophthalmic solution 1 drop  1 drop Both Eyes BID PRN Tat, Shanon Brow, MD      . Doug Sou Hold] sodium chloride (OCEAN) 0.65 % nasal spray 1 spray  1 spray Each Nare PRN Orson Eva, MD   1 spray at 03/02/17 6237  . [MAR Hold] traZODone (DESYREL) tablet 25 mg  25 mg Oral QHS PRN Reubin Milan, MD   25 mg at 03/02/17 2243    Allergies as of 02/28/2017 - Review Complete 02/28/2017  Allergen Reaction Noted  . Penicillins Hives 01/22/2011  . Sulfa antibiotics Rash 01/22/2011    Family History  Problem Relation Age of Onset  . Cancer Sister   . Asthma Sister   . Heart failure Brother   . Pulmonary embolism Brother   . Cancer Brother   . Colon cancer Neg Hx      Social History   Social History  . Marital status: Widowed    Spouse name: N/A  . Number of children: N/A  . Years of education: N/A   Occupational History  . telephone operator Retired    retired   Social History Main Topics  . Smoking status: Former Smoker    Packs/day: 0.50    Years: 30.00    Types: Cigarettes    Quit date: 12/13/2004  . Smokeless tobacco: Never  Used     Comment: former smoker  . Alcohol use 0.0 oz/week     Comment: history of drinking a 1/2 glass of wine in the evening  . Drug use: No  . Sexual activity: No   Other Topics Concern  . Not on file   Social History Narrative  . No narrative on file    Review of Systems: PER  HPI OTHERWISE ALL SYSTEMS ARE NEGATIVE.   Vitals: Blood pressure (!) 150/75, pulse 74, temperature 97.9 F (36.6 C), temperature source Oral, resp. rate 17, height 5\' 5"  (1.651 m), weight 151 lb 1.6 oz (68.5 kg), SpO2 98 %.  Physical Exam: General:   Alert,  Well-developed, well-nourished, pleasant and cooperative in NAD Head:  Normocephalic and atraumatic. Eyes:  Sclera clear, no icterus.   Conjunctiva pink. Mouth:  No lesions, dentition normal. Neck:  Supple; no masses. Lungs:  Clear throughout to auscultation.   No wheezes. No acute distress. Heart:  Regular rate and rhythm; no murmurs, clicks, rubs,  or gallops. Abdomen:  Soft, nontender and nondistended. No masses, hepatosplenomegaly or hernias noted. Normal bowel sounds, without guarding, and without rebound.   Msk:  Symmetrical without gross deformities. Normal posture. Extremities:  Without edema. Neurologic:  Alert and  oriented x4;  grossly normal neurologically. Cervical Nodes:  No significant cervical adenopathy. Psych:  Alert and cooperative. Normal mood and affect.   Lab Results:  Recent Labs  03/01/17 0608  03/02/17 0444 03/03/17 0444  WBC 5.0  --  4.7 5.8  HGB 12.4  < > 12.1 12.1  HCT 37.1  --  36.5 36.8  PLT 157  --  155 151  < > = values in this interval not displayed. BMET  Recent Labs  03/02/17 0444 03/03/17 0444  NA 136 140  K 4.3 4.1  CL 105 111  CO2 24 22  GLUCOSE 84 87  BUN 13 17  CREATININE 1.17* 1.27*  CALCIUM 8.8* 8.6*   LFT  Recent Labs  02/28/17 1938  PROT 6.8  ALBUMIN 4.0  AST 24  ALT 14  ALKPHOS 51  BILITOT 0.6     Studies/Results: CT ABD/PELVIS OCT 28: NAIAP.   LOS: 0 days    Minnie Legros  03/03/2017, 1:28 PM

## 2017-03-03 NOTE — Progress Notes (Signed)
Patient received 2 tap water enemas.  Small debris and particles still present but water appears clear.  Patient tolerated well but had some cramping towards the end.

## 2017-03-03 NOTE — Op Note (Signed)
[facility] Patient Name: Kimberly [name] Procedure Date: 03/03/2017 2:40 PM MRN: [id number] Date of Birth: [birth year] Attending MD: Barney Drain MD, MD CSN: [id number] Age: 83 Admit Type: Inpatient Procedure:                Upper GI endoscopy WITH COLD FORCEPS BIOPSY Indications:              Screening procedure, Personal history of peptic                            ulcer disease-LAST EGD 2 GASTRIC BIOPSIES OBTAINED. Providers:                Barney Drain MD, MD, Gwenlyn Fudge RN, RN, Selena Lesser, Randa Spike, Technician Referring MD:             Lillette Boxer. Miller Medicines:                TCS + Fentanyl 25 micrograms IV Complications:            No immediate complications. Estimated Blood Loss:     Estimated blood loss was minimal. Procedure:                Pre-Anesthesia Assessment:                           - Prior to the procedure, a History and Physical                            was performed, and patient medications and                            allergies were reviewed. The patient's tolerance of                            previous anesthesia was also reviewed. The risks                            and benefits of the procedure and the sedation                            options and risks were discussed with the patient.                            All questions were answered, and informed consent                            was obtained. Prior Anticoagulants: The patient has                            taken Eliquis (apixaban), last dose was 3 days                            prior to procedure. ASA Grade Assessment: II - A  patient with mild systemic disease. After reviewing                            the risks and benefits, the patient was deemed in                            satisfactory condition to undergo the procedure.                            After obtaining informed consent, the endoscope was            passed under direct vision. Throughout the                            procedure, the patient's blood pressure, pulse, and                            oxygen saturations were monitored continuously. The                            EG-[id number] ([id number]) scope was introduced through the                            mouth, and advanced to the second part of duodenum.                            The upper GI endoscopy was somewhat difficult due                            to enhanced patient gag reflex causing intolerance                            of esophageal intubation and the patient's                            agitation. Successful completion of the procedure                            was aided by increasing the dose of sedation                            medication and performing chin lift. The patient                            tolerated the procedure fairly well. Scope In: 2:43:38 PM Scope Out: 2:51:52 PM Total Procedure Duration: 0 hours 8 minutes 14 seconds  Findings:      The examined esophagus was normal.      Diffuse moderate inflammation characterized by congestion (edema) and       erythema was found on the greater curvature of the stomach and in the       gastric antrum. Biopsies were taken with a cold forceps for Helicobacter       pylori testing.      Patchy mild inflammation characterized by congestion (edema), erosions  and erythema was found in the duodenal bulb.      The second portion of the duodenum was normal.      A medium-sized hiatal hernia was present. Impression:               - MODERATE Gastritis.                           - MILD Duodenitis.                           - RECTAL BLEEDING DUE TO SIGMOID COLON                            POLYP/DIVERTICULOSIS IN SETTING OF ELIQUIS. Moderate Sedation:      Moderate (conscious) sedation was administered by the endoscopy nurse       and supervised by the endoscopist. The following parameters were        monitored: oxygen saturation, heart rate, blood pressure, and response       to care. Total physician intraservice time was 67 minutes. Recommendation:           - Await pathology results.                           - Soft diet and cardiac diet.                           - Continue present medications. RE-START ELIQUIS IN                            24 HRS.                           - Use Protonix (pantoprazole) 40 mg PO daily.                           - Patient has a contact number available for                            emergencies. The signs and symptoms of potential                            delayed complications were discussed with the                            patient. Return to normal activities tomorrow.                            Written discharge instructions were provided to the                            patient. Procedure Code(s):        --- Professional ---                           [phone], Esophagogastroduodenoscopy, flexible,  transoral; with biopsy, single or multiple                           99152, Moderate sedation services provided by the                            same physician or other qualified health care                            professional performing the diagnostic or                            therapeutic service that the sedation supports,                            requiring the presence of an independent trained                            observer to assist in the monitoring of the                            patient's level of consciousness and physiological                            status; initial 15 minutes of intraservice time,                            patient age 78 years or older                           [phone], Moderate sedation services; each additional                            15 minutes intraservice time                           99153, Moderate sedation services; each additional                            15  minutes intraservice time                           99153, Moderate sedation services; each additional                            15 minutes intraservice time Diagnosis Code(s):        --- Professional ---                           K29.70, Gastritis, unspecified, without bleeding                           K29.80, Duodenitis without bleeding                           Z13.810, Encounter for screening  for upper                            gastrointestinal disorder                           Z87.11, Personal history of peptic ulcer disease CPT copyright 2016 [employer]. All rights reserved. The codes documented in this report are preliminary and upon coder review may  be revised to meet current compliance requirements. Barney Drain, MD Barney Drain MD, MD 03/03/2017 3:17:00 PM This report has been signed electronically. Number of Addenda: 0

## 2017-03-03 NOTE — Progress Notes (Signed)
Dr. Baltazar Najjar paged to request pain medication d/t 10/10 pain. Her dilaudid order expired. Waiting for orders/call back.

## 2017-03-03 NOTE — Care Management Note (Signed)
Case Management Note  Patient Details  Name: Kimberly [name] MRN: [id number] Date of Birth: 1926/11/27  Subjective/Objective:  Pt from home, lives alone. She is ind with ADL's. She has PCP, she manages her own medications. She reports trouble with housework and transportation. She pays a friend to take her places. She has used [facility] before but doesn't like using them because she has to wait and have exact change. She has no HH, does not feel she needs HH. She communicates no needs or concerns about discharging home.                   Action/Plan: Potential transition home later today after TCS. No CM needs noted at this time.   Expected Discharge Date:  03/09/17               Expected Discharge Plan:  Home/Self Care  In-House Referral:  NA  Discharge planning Services  CM Consult  Post Acute Care Choice:  NA Choice offered to:  NA  Status of Service:  Completed, signed off  Sherald Barge, RN 03/03/2017, 1:44 PM

## 2017-03-03 NOTE — Discharge Summary (Addendum)
Physician Discharge Summary  Kimberly [name] XBM:[id number] DOB: [birth year] DOA: 02/28/2017  PCP: Wardell Honour, MD  Admit date: 02/28/2017 Discharge date: 03/04/2017  Admitted From: Home Disposition:  Home   Recommendations for Outpatient Follow-up:  1. Follow up with PCP in 1-2 weeks 2. Please obtain BMP/CBC in one week     Discharge Condition: Stable CODE STATUS: DNR Diet recommendation: Heart Healthy   Brief/Interim Summary: 83 year old female with a history of permanent atrial fibrillation, diverticulosis, chronic diastolic CHF, essential hypertension, fibromyalgia, CKD stage III, complete heart block status post permanent pacemaker presented with one-week history of intermittent rectal bleeding that had worsened over the past 2-3 days. The patient states that she normally struggles with constipation, and she states that in the past week she has had to strain more to have bowel movement. Associated with this, the patient has had some worsening of her rectal bleeding and abdominal pain. She also has a sensation of incomplete evacuation. She stated that her abdominal pain has been intermittent this past week, but has become more constant in the past 2-3 days prior to admission. She denies any fevers, chills, chest pain, nausea, vomiting, diarrhea. However she endorses some dyspnea on exertion which she states has been the same as usual. Upon presentation, the patient was afebrile hemodynamically stable with hemoglobin of 13.5. Her last apixaban dose was on the morning of February 28, 2017.  Discharge Diagnoses:  Hematochezia/abdominal pain -Concerned about ischemic colitis vs diverticular bleed -no further bleeding since admission -CT abdomen and pelvis--diverticulosis of the descending and sigmoid colon, right nephrolithiasis without hydronephrosis, multilevel spondylitic changes in the lumbar spine.  T12 chronic compression deformity. -GI consult  appreciated-->colonoscopy 10/30; indefinite PPI per GI (See Dr. Roseanne Kaufman note) -Holding apixaban---restart 03/04/17 per GI -Clear liquid diet>>>soft diet -Trend hemoglobin-->stable -Judicious IV fluids -October 13, 2012 EGD--severe ulcerative reflux esophagitis, gastric erosions with a single antral ulcer  -constipation management per GI 03/03/17 colonoscopy--5 polyps removed, diverticulosis in the rectosigmoid colon, internal hemorrhoids -Discharge was held until March 04, 2017 because the patient was not safe for discharge as she lives by herself and has no help, and the patient still had some effects of conscious sedation in the late afternoon March 03, 2017  Heartburn/Atypical Chest pain -EKG--paced -cycle troponins--neg x 2 -GI cocktail--helped  Chronic diastolic CHF -Daily weights--stable -Continue home dose furosemide -November 28, 2014 Echo EF 60-65% -Continue carvedilol   Permanent atrial fibrillation -Rate controlled -holding apixaban initially in the setting of rectal bleeding -Status post AV node ablation-->CHB-->PPM -continue carvedilol  Dyspnea on exertion -Chest x-ray--neg -100% on RA  CKD stage III -Baseline creatinine 1.2-1.6 -stable  Essential hypertension -Continue carvedilol  Complete heart block -Status post permanent pacemaker January 2012  Lower extremity pain and edema -venous duplex--neg    Discharge Instructions   Allergies as of 03/03/2017      Reactions   Penicillins Hives   Has patient had a PCN reaction causing immediate rash, facial/tongue/throat swelling, SOB or lightheadedness with hypotension: Yes Has patient had a PCN reaction causing severe rash involving mucus membranes or skin necrosis: No Has patient had a PCN reaction that required hospitalization No Has patient had a PCN reaction occurring within the last 10 years: Yes If all of the above answers are "NO", then may proceed with Cephalosporin use.   Sulfa  Antibiotics Rash      Medication List    TAKE these medications   acetaminophen 500 MG tablet Commonly known as:  TYLENOL Take 500 mg  by mouth every 6 (six) hours as needed.   carvedilol 6.25 MG tablet Commonly known as:  COREG TAKE 1 TABLET BY MOUTH TWO  TIMES DAILY WITH A MEAL   ELIQUIS 2.5 MG Tabs tablet Generic drug:  apixaban TAKE 1 TABLET BY MOUTH  TWICE A DAY   furosemide 20 MG tablet Commonly known as:  LASIX Take 1 tablet (20 mg total) by mouth daily.   ICAPS AREDS 2 Caps Take 2 capsules by mouth daily.   pantoprazole 40 MG tablet Commonly known as:  PROTONIX Take 1 tablet (40 mg total) by mouth daily.   SYSTANE ULTRA 0.4-0.3 % Soln Generic drug:  Polyethyl Glycol-Propyl Glycol Place 1-2 drops into both eyes 2 (two) times daily as needed.       Allergies  Allergen Reactions  . Penicillins Hives    Has patient had a PCN reaction causing immediate rash, facial/tongue/throat swelling, SOB or lightheadedness with hypotension: Yes Has patient had a PCN reaction causing severe rash involving mucus membranes or skin necrosis: No Has patient had a PCN reaction that required hospitalization No Has patient had a PCN reaction occurring within the last 10 years: Yes If all of the above answers are "NO", then may proceed with Cephalosporin use.   . Sulfa Antibiotics Rash    Consultations:  GI   Procedures/Studies: Ct Abdomen Pelvis Wo Contrast  Result Date: 03/01/2017 CLINICAL DATA:  Oral only Abdominal pain with rectal bleeding Hx of htn,CKD III, hysterectomy,appendectomy/bbj EXAM: CT ABDOMEN AND PELVIS WITHOUT CONTRAST TECHNIQUE: Multidetector CT imaging of the abdomen and pelvis was performed following the standard protocol without IV contrast. COMPARISON:  06/24/2016 FINDINGS: Lower chest: Coronary and aortic calcifications. Transvenous pacing leads partially visualized. No pleural or pericardial effusion. Small hiatal hernia. Visualized lung bases clear.  Hepatobiliary: Stable cysts in segments 3, 4B, and 6. No new liver abnormality is seen. Status post cholecystectomy. No biliary dilatation. Pancreas: Unremarkable. No pancreatic ductal dilatation or surrounding inflammatory changes. Spleen: Normal in size without focal abnormality. Adrenals/Urinary Tract: Small calcifications in the right renal collecting system, largest stone or cluster approximately 0.4 cm. No hydronephrosis. No ureterectasis. Urinary bladder is incompletely distended. Stomach/Bowel: Hiatal hernia. Stomach is nondilated. Small bowel is nondilated. Appendix reportedly surgically absent. The colon is nondistended. Multiple diverticula from distal descending and sigmoid portions of the colon, without adjacent inflammatory/edematous change or abscess. Vascular/Lymphatic: Heavy calcified plaque through the visualized distal descending thoracic and abdominal aorta without aneurysm. Extensive visceral and renal arterial calcified plaque. Patchy bilateral iliofemoral arterial plaque. No abdominal or pelvic adenopathy localized. Reproductive: Status post hysterectomy. No adnexal masses. Other: No ascites.  No free air. Musculoskeletal: Moderately severe thoracolumbar dextroscoliosis apex L1-2 with multilevel spondylitic changes throughout the lumbar spine. Chronic T12 compression deformity. No acute fracture or worrisome bone lesion. IMPRESSION: 1. No acute findings. 2. Right nephrolithiasis without hydronephrosis. 3. Hiatal hernia 4. Descending and sigmoid diverticulosis. 5. Extensive aortoiliac, coronary, visceral and renal artery calcified atheromatous plaque. 6. Lumbar scoliosis, multilevel spondylitic change, and chronic T12 compression deformity. Electronically Signed   By: Lucrezia Europe M.D.   On: 03/01/2017 14:28   Dg Chest 2 View  Result Date: 03/01/2017 CLINICAL DATA:  atrial fibrillation, diverticulosis, chronic diastolic CHF, essential hypertension, fibromyalgia, CKD stage III, complete heart  block status post permanent pacemaker presented with one-week history of intermittent rectal bleeding that had worsened over the past 2-3 daysHISTORY OF CHF, HTN EXAM: CHEST - 2 VIEW COMPARISON:  06/24/2016 FINDINGS: Lungs hyperinflated, clear. Heart size upper  limits normal. Tortuous atheromatous thoracic aorta. Stable left subclavian AICD. No effusion.  No pneumothorax. Diffuse osteopenia. Compression deformity near the thoracolumbar junction, present since 06/24/2016. IMPRESSION: 1. Stable borderline cardiomegaly.  No acute disease. 2.  Aortic Atherosclerosis (ICD10-170.0) Electronically Signed   By: Lucrezia Europe M.D.   On: 03/01/2017 14:54   US Venous Img Lower Bilateral  Result Date: 03/01/2017 CLINICAL DATA:  Pain at rest x months. EXAM: BILATERAL LOWER EXTREMITY VENOUS DOPPLER ULTRASOUND TECHNIQUE: Gray-scale sonography with compression, as well as color and duplex ultrasound, were performed to evaluate the deep venous system from the level of the common femoral vein through the popliteal and proximal calf veins. COMPARISON:  None FINDINGS: Normal compressibility of the common femoral, superficial femoral, and popliteal veins, as well as the proximal calf veins. No filling defects to suggest DVT on grayscale or color Doppler imaging. Doppler waveforms show normal direction of venous flow, normal respiratory phasicity and response to augmentation. Visualized segments of the saphenous venous systems normal in caliber and compressibility. IMPRESSION: No evidence of  lower extremity deep vein thrombosis, bilaterally. Electronically Signed   By: Lucrezia Europe M.D.   On: 03/01/2017 14:03        Discharge Exam: Vitals:   03/03/17 1450 03/03/17 1455  BP: (!) 121/57 (!) 112/53  Pulse: 70 70  Resp: (!) 22 16  Temp:    SpO2: 98% 99%   Vitals:   03/03/17 1440 03/03/17 1445 03/03/17 1450 03/03/17 1455  BP: (!) 90/45 (!) 104/54 (!) 121/57 (!) 112/53  Pulse: 71 71 70 70  Resp: 14 (!) 22 (!) 22 16  Temp:       TempSrc:      SpO2: 98% 99% 98% 99%  Weight:      Height:        General: Pt is alert, awake, not in acute distress Cardiovascular: IRRR, S1/S2 +, no rubs, no gallops Respiratory: CTA bilaterally, no wheezing, no rhonchi Abdominal: Soft, NT, ND, bowel sounds + Extremities: no edema, no cyanosis   The results of significant diagnostics from this hospitalization (including imaging, microbiology, ancillary and laboratory) are listed below for reference.    Significant Diagnostic Studies: Ct Abdomen Pelvis Wo Contrast  Result Date: 03/01/2017 CLINICAL DATA:  Oral only Abdominal pain with rectal bleeding Hx of htn,CKD III, hysterectomy,appendectomy/bbj EXAM: CT ABDOMEN AND PELVIS WITHOUT CONTRAST TECHNIQUE: Multidetector CT imaging of the abdomen and pelvis was performed following the standard protocol without IV contrast. COMPARISON:  06/24/2016 FINDINGS: Lower chest: Coronary and aortic calcifications. Transvenous pacing leads partially visualized. No pleural or pericardial effusion. Small hiatal hernia. Visualized lung bases clear. Hepatobiliary: Stable cysts in segments 3, 4B, and 6. No new liver abnormality is seen. Status post cholecystectomy. No biliary dilatation. Pancreas: Unremarkable. No pancreatic ductal dilatation or surrounding inflammatory changes. Spleen: Normal in size without focal abnormality. Adrenals/Urinary Tract: Small calcifications in the right renal collecting system, largest stone or cluster approximately 0.4 cm. No hydronephrosis. No ureterectasis. Urinary bladder is incompletely distended. Stomach/Bowel: Hiatal hernia. Stomach is nondilated. Small bowel is nondilated. Appendix reportedly surgically absent. The colon is nondistended. Multiple diverticula from distal descending and sigmoid portions of the colon, without adjacent inflammatory/edematous change or abscess. Vascular/Lymphatic: Heavy calcified plaque through the visualized distal descending thoracic and  abdominal aorta without aneurysm. Extensive visceral and renal arterial calcified plaque. Patchy bilateral iliofemoral arterial plaque. No abdominal or pelvic adenopathy localized. Reproductive: Status post hysterectomy. No adnexal masses. Other: No ascites.  No free air. Musculoskeletal: Moderately severe  thoracolumbar dextroscoliosis apex L1-2 with multilevel spondylitic changes throughout the lumbar spine. Chronic T12 compression deformity. No acute fracture or worrisome bone lesion. IMPRESSION: 1. No acute findings. 2. Right nephrolithiasis without hydronephrosis. 3. Hiatal hernia 4. Descending and sigmoid diverticulosis. 5. Extensive aortoiliac, coronary, visceral and renal artery calcified atheromatous plaque. 6. Lumbar scoliosis, multilevel spondylitic change, and chronic T12 compression deformity. Electronically Signed   By: Lucrezia Europe M.D.   On: 03/01/2017 14:28   Dg Chest 2 View  Result Date: 03/01/2017 CLINICAL DATA:  atrial fibrillation, diverticulosis, chronic diastolic CHF, essential hypertension, fibromyalgia, CKD stage III, complete heart block status post permanent pacemaker presented with one-week history of intermittent rectal bleeding that had worsened over the past 2-3 daysHISTORY OF CHF, HTN EXAM: CHEST - 2 VIEW COMPARISON:  06/24/2016 FINDINGS: Lungs hyperinflated, clear. Heart size upper limits normal. Tortuous atheromatous thoracic aorta. Stable left subclavian AICD. No effusion.  No pneumothorax. Diffuse osteopenia. Compression deformity near the thoracolumbar junction, present since 06/24/2016. IMPRESSION: 1. Stable borderline cardiomegaly.  No acute disease. 2.  Aortic Atherosclerosis (ICD10-170.0) Electronically Signed   By: Lucrezia Europe M.D.   On: 03/01/2017 14:54   US Venous Img Lower Bilateral  Result Date: 03/01/2017 CLINICAL DATA:  Pain at rest x months. EXAM: BILATERAL LOWER EXTREMITY VENOUS DOPPLER ULTRASOUND TECHNIQUE: Gray-scale sonography with compression, as well as  color and duplex ultrasound, were performed to evaluate the deep venous system from the level of the common femoral vein through the popliteal and proximal calf veins. COMPARISON:  None FINDINGS: Normal compressibility of the common femoral, superficial femoral, and popliteal veins, as well as the proximal calf veins. No filling defects to suggest DVT on grayscale or color Doppler imaging. Doppler waveforms show normal direction of venous flow, normal respiratory phasicity and response to augmentation. Visualized segments of the saphenous venous systems normal in caliber and compressibility. IMPRESSION: No evidence of  lower extremity deep vein thrombosis, bilaterally. Electronically Signed   By: Lucrezia Europe M.D.   On: 03/01/2017 14:03     Microbiology: No results found for this or any previous visit (from the past 240 hour(s)).   Labs: Basic Metabolic Panel:  Recent Labs Lab 02/26/17 1509 02/28/17 1938 03/01/17 0608 03/02/17 0444 03/03/17 0444  NA 141 138 141 136 140  K 4.2 3.7 4.0 4.3 4.1  CL 104 104 107 105 111  CO2 23 23 23 24 22   GLUCOSE 89 98 88 84 87  BUN 20 21* 18 13 17   CREATININE 1.38* 1.30* 1.15* 1.17* 1.27*  CALCIUM 9.3 9.1 8.9 8.8* 8.6*  MG  --   --   --  1.8  --    Liver Function Tests:  Recent Labs Lab 02/26/17 1509 02/28/17 1938  AST 20 24  ALT 10 14  ALKPHOS 57 51  BILITOT 0.5 0.6  PROT 6.7 6.8  ALBUMIN 4.3 4.0    Recent Labs Lab 02/28/17 1938  LIPASE 37   No results for input(s): AMMONIA in the last 168 hours. CBC:  Recent Labs Lab 02/26/17 1509  02/28/17 1938 03/01/17 0035 03/01/17 0608 03/01/17 1331 03/02/17 0444 03/03/17 0444  WBC 6.8  --  4.5  --  5.0  --  4.7 5.8  NEUTROABS 4.8  --  2.9  --   --   --   --   --   HGB 13.3  < > 13.5 12.8 12.4 12.5 12.1 12.1  HCT 39.2  --  39.2  --  37.1  --  36.5  36.8  MCV 102*  --  101.0*  --  101.4*  --  103.1* 103.1*  PLT 186  --  153  --  157  --  155 151  < > = values in this interval not  displayed. Cardiac Enzymes:  Recent Labs Lab 03/02/17 1322 03/02/17 1816  TROPONINI <0.03 <0.03   BNP: Invalid input(s): POCBNP CBG: No results for input(s): GLUCAP in the last 168 hours.  Time coordinating discharge:  Greater than 30 minutes  Signed:  Naresh Althaus, DO [facility] Pager: [phone] 03/03/2017, 4:01 PM

## 2017-03-03 NOTE — Op Note (Signed)
[facility] Patient Name: Kimberly [name] Procedure Date: 03/03/2017 1:20 PM MRN: [id number] Date of Birth: 12-25-1926 Attending MD: Barney Drain MD, MD CSN: [id number] Age: 83 Admit Type: Inpatient Procedure:                Colonoscopy WITH SNARE POLYPECTOMY Indications:              Hematochezia ON ELIQUIS. LAST DOSE OCT 27. Providers:                Barney Drain MD, MD, Jeanann Lewandowsky. Sharon Seller, RN, Selena Lesser, Randa Spike, Technician Referring MD:             Lillette Boxer. Miller Medicines:                Fentanyl 50 micrograms IV, Midazolam 6 mg IV Complications:            No immediate complications. Estimated Blood Loss:     Estimated blood loss: none. Procedure:                Pre-Anesthesia Assessment:                           - Prior to the procedure, a History and Physical                            was performed, and patient medications and                            allergies were reviewed. The patient's tolerance of                            previous anesthesia was also reviewed. The risks                            and benefits of the procedure and the sedation                            options and risks were discussed with the patient.                            All questions were answered, and informed consent                            was obtained. Prior Anticoagulants: The patient has                            taken Eliquis (apixaban), last dose was 3 days                            prior to procedure. ASA Grade Assessment: II - A                            patient with mild systemic disease. After reviewing  the risks and benefits, the patient was deemed in                            satisfactory condition to undergo the procedure.                            After obtaining informed consent, the colonoscope                            was passed under direct vision. Throughout the   procedure, the patient's blood pressure, pulse, and                            oxygen saturations were monitored continuously. The                            EC-[id number] ([id number]) scope was introduced through                            the anus and advanced to the 5 cm into the ileum.                            The colonoscopy was technically difficult and                            complex due to restricted mobility of the colon.                            Successful completion of the procedure was aided by                            increasing the dose of sedation medication,                            changing the patient to a supine position,                            withdrawing the scope and replacing with the                            pediatric colonoscope, straightening and shortening                            the scope to obtain bowel loop reduction and                            COLOWRAP. The patient tolerated the procedure                            fairly well. The quality of the bowel preparation                            was good except the cecum was poor. The terminal  ileum, ileocecal valve, appendiceal orifice, and                            rectum were photographed. Scope In: 1:50:16 PM Scope Out: 2:34:11 PM Scope Withdrawal Time: 0 hours 22 minutes 22 seconds  Total Procedure Duration: 0 hours 43 minutes 55 seconds  Findings:      The terminal ileum appeared normal.      Five sessile polyps were found in the rectum, sigmoid colon, proximal       transverse colon and ascending colon. The polyps were 4 to 8 mm in size.       These polyps were removed with a hot snare. Resection and retrieval were       complete. Estimated blood loss: none.      Multiple small and large-mouthed diverticula were found in the       recto-sigmoid colon and sigmoid colon.      The recto-sigmoid colon and sigmoid colon were grossly tortuous.      Internal  hemorrhoids were found during retroflexion. The hemorrhoids       were small. Impression:               - The examined portion of the ileum was normal.                           - Five 4 to 8 mm polyps in the rectum(1: BTL 2), in                            the sigmoid colon(1: BTL 2)), in the proximal                            transverse colon(1) and in the ascending colon(2),                            removed with a hot snare. Resected and retrieved.                           - Diverticulosis in the recto-sigmoid colon and in                            the sigmoid colon.                           - Tortuous colon.                           - Internal hemorrhoids.                           -99% COLON CLEARED. UNABLE TO CLEAR CECUM DUE TO                            RETAINED PARTICULATE MATTER THAT COULD NOT BE                            ASPIRATED. Moderate Sedation:      Moderate (conscious) sedation was administered by  the endoscopy nurse       and supervised by the endoscopist. The following parameters were       monitored: oxygen saturation, heart rate, blood pressure, and response       to care. Total physician intraservice time was 67 minutes. Recommendation:           - Repeat colonoscopy in 3 - 5 years for                            surveillance.                           - Await pathology results.                           - High fiber diet.                           - Continue present medications.                           - Return to my office in 6 months.                           - Return patient to hospital ward for ongoing care. Procedure Code(s):        --- Professional ---                           [phone], Colonoscopy, flexible; with removal of                            tumor(s), polyp(s), or other lesion(s) by snare                            technique                           99152, Moderate sedation services provided by the                            same physician  or other qualified health care                            professional performing the diagnostic or                            therapeutic service that the sedation supports,                            requiring the presence of an independent trained                            observer to assist in the monitoring of the                            patient's level of consciousness and physiological  status; initial 15 minutes of intraservice time,                            patient age 9 years or older                           [phone], Moderate sedation services; each additional                            15 minutes intraservice time                           [phone], Moderate sedation services; each additional                            15 minutes intraservice time                           99153, Moderate sedation services; each additional                            15 minutes intraservice time Diagnosis Code(s):        --- Professional ---                           K64.8, Other hemorrhoids                           K62.1, Rectal polyp                           D12.5, Benign neoplasm of sigmoid colon                           D12.3, Benign neoplasm of transverse colon (hepatic                            flexure or splenic flexure)                           D12.2, Benign neoplasm of ascending colon                           K92.1, Melena (includes Hematochezia)                           K57.30, Diverticulosis of large intestine without                            perforation or abscess without bleeding                           Q43.8, Other specified congenital malformations of                            intestine CPT copyright 2016 American Medical Association. All rights reserved. The codes documented in this report are preliminary and upon  coder review may  be revised to meet current compliance requirements. Barney Drain, MD Barney Drain MD, MD 03/03/2017  3:08:48 PM This report has been signed electronically. Number of Addenda: 0

## 2017-03-03 NOTE — Progress Notes (Signed)
This RN and Shamika, NT witnessed patient's valuables and counted money in front of patient. Valuables placed in bag, patient signed in confirmation, and Maude Leriche, [employer], retrieved valuables at this time. Jinny Blossom, RN notified.

## 2017-03-04 ENCOUNTER — Observation Stay (HOSPITAL_COMMUNITY): Payer: Medicare Other

## 2017-03-04 ENCOUNTER — Ambulatory Visit: Payer: Medicare Other | Admitting: Family Medicine

## 2017-03-04 DIAGNOSIS — N183 Chronic kidney disease, stage 3 (moderate): Secondary | ICD-10-CM | POA: Diagnosis not present

## 2017-03-04 DIAGNOSIS — K922 Gastrointestinal hemorrhage, unspecified: Secondary | ICD-10-CM | POA: Diagnosis not present

## 2017-03-04 DIAGNOSIS — R1030 Lower abdominal pain, unspecified: Secondary | ICD-10-CM | POA: Diagnosis not present

## 2017-03-04 DIAGNOSIS — I442 Atrioventricular block, complete: Secondary | ICD-10-CM | POA: Diagnosis not present

## 2017-03-04 DIAGNOSIS — I428 Other cardiomyopathies: Secondary | ICD-10-CM | POA: Diagnosis not present

## 2017-03-04 DIAGNOSIS — I482 Chronic atrial fibrillation: Secondary | ICD-10-CM | POA: Diagnosis not present

## 2017-03-04 DIAGNOSIS — K5730 Diverticulosis of large intestine without perforation or abscess without bleeding: Secondary | ICD-10-CM | POA: Diagnosis not present

## 2017-03-04 DIAGNOSIS — M797 Fibromyalgia: Secondary | ICD-10-CM | POA: Diagnosis not present

## 2017-03-04 DIAGNOSIS — K625 Hemorrhage of anus and rectum: Secondary | ICD-10-CM | POA: Diagnosis not present

## 2017-03-04 DIAGNOSIS — I10 Essential (primary) hypertension: Secondary | ICD-10-CM | POA: Diagnosis not present

## 2017-03-04 DIAGNOSIS — I5032 Chronic diastolic (congestive) heart failure: Secondary | ICD-10-CM | POA: Diagnosis not present

## 2017-03-04 DIAGNOSIS — K921 Melena: Secondary | ICD-10-CM | POA: Diagnosis not present

## 2017-03-04 DIAGNOSIS — Z7901 Long term (current) use of anticoagulants: Secondary | ICD-10-CM | POA: Diagnosis not present

## 2017-03-04 LAB — CBC
HEMATOCRIT: 36.4 % (ref 36.0–46.0)
Hemoglobin: 12.3 g/dL (ref 12.0–15.0)
MCH: 34.3 pg — ABNORMAL HIGH (ref 26.0–34.0)
MCHC: 33.8 g/dL (ref 30.0–36.0)
MCV: 101.4 fL — AB (ref 78.0–100.0)
Platelets: 140 10*3/uL — ABNORMAL LOW (ref 150–400)
RBC: 3.59 MIL/uL — AB (ref 3.87–5.11)
RDW: 14.2 % (ref 11.5–15.5)
WBC: 10.3 10*3/uL (ref 4.0–10.5)

## 2017-03-04 MED ORDER — HYDROMORPHONE HCL 1 MG/ML IJ SOLN
0.5000 mg | INTRAMUSCULAR | Status: DC | PRN
Start: 2017-03-04 — End: 2017-03-05
  Administered 2017-03-04: 0.5 mg via INTRAVENOUS
  Filled 2017-03-04 (×2): qty 1

## 2017-03-04 MED ORDER — IOPAMIDOL (ISOVUE-370) INJECTION 76%
100.0000 mL | Freq: Once | INTRAVENOUS | Status: AC | PRN
  Administered 2017-03-04: 100 mL via INTRAVENOUS

## 2017-03-04 NOTE — Progress Notes (Signed)
PROGRESS NOTE    Kimberly [name]  HRC:[id number] DOB: [birth year] DOA: 02/28/2017 PCP: Wardell Honour, MD    Brief Narrative:  83 year old female with a history of permanent atrial fibrillation, diverticulosis, chronic diastolic CHF, essential hypertension, fibromyalgia, CKD stage III, complete heart block status post permanent pacemaker presented with one-week history of intermittent rectal bleeding that had worsened over the past 2-3 days. The patient states that she normally struggles with constipation, and she states that in the past week she has had to strain more to have bowel movement. Associated with this, the patient has had some worsening of her rectal bleeding and abdominal pain. She also has a sensation of incomplete evacuation. She stated that her abdominal pain has been intermittent this past week, but has become more constant in the past 2-3 days prior to admission. She denies any fevers, chills, chest pain, nausea, vomiting, diarrhea. However she endorses some dyspnea on exertion which she states has been the same as usual. Upon presentation, the patient was afebrile hemodynamically stable with hemoglobin of 13.5. Her last apixaban dose was on the morning of February 28, 2017.  At time of discharge on 10/31 patient repeatedly made comments about killing herself.    Assessment & Plan:   Principal Problem:   Rectal bleeding Active Problems:   Permanent atrial fibrillation (HCC)   Fibromyalgia   Essential hypertension   CKD (chronic kidney disease) stage 3, GFR 30-59 ml/min (HCC)   Chronic anticoagulation   Chronic diastolic heart failure (HCC)   Complete heart block (HCC)   PUD (peptic ulcer disease)   Non-ischemic cardiomyopathy (HCC)   Glaucoma   Hematochezia   Lower GI bleed   Lower abdominal pain   Suicide Ideation - place on hold - TTS consult placed - discharge canceled  Hematochezia/abdominal pain -Concerned about ischemic colitis vs diverticular  bleed -no further bleeding since admission -CT abdomen and pelvis--diverticulosis of the descending and sigmoid colon, right nephrolithiasis without hydronephrosis, multilevel spondylitic changes in the lumbar spine. T12 chronic compression deformity. -GI consult appreciated-->colonoscopy 10/30; indefinite PPI per GI (See Dr. Roseanne Kaufman note)  -Holding apixaban---restart 03/04/17 per GI - restart apixaban -October 13, 2012 EGD--severe ulcerative reflux esophagitis, gastric erosions with a single antral ulcer  -constipation management per GI 03/03/17 colonoscopy--5 polyps removed, diverticulosis in the rectosigmoid colon, internal hemorrhoids  Heartburn/Atypical Chest pain -EKG--paced - troponins--neg x 2 -GI cocktail--helped  Chronic diastolic CHF -Continue home dose furosemide -November 28, 2014 Echo EF 60-65% -Continue carvedilol   Permanent atrial fibrillation -Rate controlled - restart apixaban -Status post AV node ablation-->CHB-->PPM -continue carvedilol  Dyspnea on exertion -Chest x-ray--neg -100% on RA  CKD stage III -Baseline creatinine 1.2-1.6 -stable  Essential hypertension -Continue carvedilol  Complete heart block -Status post permanent pacemaker January 2012  Lower extremity pain and edema -venous duplex--neg   DVT prophylaxis: holding Code Status: DNR Family Communication: patient reports that she does not want me to call her daughter Disposition Plan: pending TTS evaluation   Consultants:   TTS  Gastroenterology  Procedures:   Upper endoscopy  Colonoscopy  Antimicrobials:   none    Subjective: Patient paranoid- asking for me to check bathroom before talking.  Very upset that she has chronic abdominal pain that no one is investigating.  Repeatedly states she is going to go to a pawn shop, get a pistol and kill herself if she is discharged.  Objective: Vitals:   03/03/17 1455 03/03/17 2045 03/03/17 2100 03/04/17 0500  BP: (!)  112/53  139/62 (!) 136/51  Pulse: 70  68 71  Resp: 16  18 20   Temp:   97.9 F (36.6 C) 98.9 F (37.2 C)  TempSrc:   Oral Oral  SpO2: 99% 96% 100% 99%  Weight:    72.3 kg (159 lb 6.3 oz)  Height:        Intake/Output Summary (Last 24 hours) at 03/04/17 1150 Last data filed at 03/04/17 0704  Gross per 24 hour  Intake              720 ml  Output                1 ml  Net              719 ml   Filed Weights   03/02/17 0300 03/03/17 0515 03/04/17 0500  Weight: 71 kg (156 lb 9.6 oz) 68.5 kg (151 lb 1.6 oz) 72.3 kg (159 lb 6.3 oz)    Examination:  Would not allow me to examine her     Data Reviewed: I have personally reviewed following labs and imaging studies  CBC:  Recent Labs Lab 02/26/17 1509 02/28/17 1938  03/01/17 0608 03/01/17 1331 03/02/17 0444 03/03/17 0444 03/04/17 0552  WBC 6.8 4.5  --  5.0  --  4.7 5.8 10.3  NEUTROABS 4.8 2.9  --   --   --   --   --   --   HGB 13.3 13.5  < > 12.4 12.5 12.1 12.1 12.3  HCT 39.2 39.2  --  37.1  --  36.5 36.8 36.4  MCV 102* 101.0*  --  101.4*  --  103.1* 103.1* 101.4*  PLT 186 153  --  157  --  155 151 140*  < > = values in this interval not displayed. Basic Metabolic Panel:  Recent Labs Lab 02/26/17 1509 02/28/17 1938 03/01/17 0608 03/02/17 0444 03/03/17 0444  NA 141 138 141 136 140  K 4.2 3.7 4.0 4.3 4.1  CL 104 104 107 105 111  CO2 23 23 23 24 22   GLUCOSE 89 98 88 84 87  BUN 20 21* 18 13 17   CREATININE 1.38* 1.30* 1.15* 1.17* 1.27*  CALCIUM 9.3 9.1 8.9 8.8* 8.6*  MG  --   --   --  1.8  --    GFR: Estimated Creatinine Clearance: 30.5 mL/min (A) (by C-G formula based on SCr of 1.27 mg/dL (H)). Liver Function Tests:  Recent Labs Lab 02/26/17 1509 02/28/17 1938  AST 20 24  ALT 10 14  ALKPHOS 57 51  BILITOT 0.5 0.6  PROT 6.7 6.8  ALBUMIN 4.3 4.0    Recent Labs Lab 02/28/17 1938  LIPASE 37   No results for input(s): AMMONIA in the last 168 hours. Coagulation Profile:  Recent Labs Lab  02/28/17 1938  INR 1.16   Cardiac Enzymes:  Recent Labs Lab 03/02/17 1322 03/02/17 1816  TROPONINI <0.03 <0.03   BNP (last 3 results) No results for input(s): PROBNP in the last 8760 hours. HbA1C: No results for input(s): HGBA1C in the last 72 hours. CBG: No results for input(s): GLUCAP in the last 168 hours. Lipid Profile: No results for input(s): CHOL, HDL, LDLCALC, TRIG, CHOLHDL, LDLDIRECT in the last 72 hours. Thyroid Function Tests: No results for input(s): TSH, T4TOTAL, FREET4, T3FREE, THYROIDAB in the last 72 hours. Anemia Panel:  Recent Labs  03/01/17 1331  VITAMINB12 231  FOLATE 11.0   Sepsis Labs: No results for input(s): PROCALCITON, LATICACIDVEN  in the last 168 hours.  No results found for this or any previous visit (from the past 240 hour(s)).       Radiology Studies: No results found.      Scheduled Meds: . apixaban  2.5 mg Oral BID  . carvedilol  6.25 mg Oral BID WC  . furosemide  20 mg Oral Daily  . pantoprazole  40 mg Oral Daily   Continuous Infusions:   LOS: 0 days    Time spent: 30 minutes    Loretha Stapler, MD [facility] Pager [phone]  If 7PM-7AM, please contact night-coverage [email or web address] Password [email or web address] 03/04/2017, 11:50 AM

## 2017-03-04 NOTE — Progress Notes (Signed)
Subjective:  Patient seems somewhat paranoid this morning. Asked me to close the door. Then whispered that she had been blocked from making phone calls. She had her TV remote in her hand and was trying to dial out. She was appreciative when I pointed out that she was not using the telephone.   She is complaining of RLQ pain which she has had for "weeks". States no one believes that she is having pain. None of the scans show anything. "I'm tired of them telling me I'm just constipated".   Objective: Vital signs in last 24 hours: Temp:  [97.9 F (36.6 C)-98.9 F (37.2 C)] 98.9 F (37.2 C) (10/31 0500) Pulse Rate:  [68-74] 71 (10/31 0500) Resp:  [14-28] 20 (10/31 0500) BP: (84-166)/(45-78) 136/51 (10/31 0500) SpO2:  [96 %-100 %] 99 % (10/31 0500) Weight:  [159 lb 6.3 oz (72.3 kg)] 159 lb 6.3 oz (72.3 kg) (10/31 0500) Last BM Date: 03/03/17 General:   Alert,  Somewhat confused. Cooperative in NAD Head:  Normocephalic and atraumatic. Eyes:  Sclera clear, no icterus.  Abdomen:  Soft,  nondistended. Mild to moderate lower abd tenderness, both RLQ/LLQ. Patient pushes my hand away. No masses, hepatosplenomegaly or hernias noted. Normal bowel sounds,  without rebound.   Extremities:  Without clubbing, deformity or edema. Neurologic:  Alert    grossly normal neurologically. Skin:  Intact without significant lesions or rashes. Psych:  Alert and cooperative. Normal mood and affect.  Intake/Output from previous day: 10/30 0701 - 10/31 0700 In: 600 [P.O.:600] Out: 1 [Urine:1] Intake/Output this shift: Total I/O In: 120 [P.O.:120] Out: -   Lab Results: CBC  Recent Labs  03/02/17 0444 03/03/17 0444 03/04/17 0552  WBC 4.7 5.8 10.3  HGB 12.1 12.1 12.3  HCT 36.5 36.8 36.4  MCV 103.1* 103.1* 101.4*  PLT 155 151 140*   BMET  Recent Labs  03/02/17 0444 03/03/17 0444  NA 136 140  K 4.3 4.1  CL 105 111  CO2 24 22  GLUCOSE 84 87  BUN 13 17  CREATININE 1.17* 1.27*  CALCIUM 8.8*  8.6*   LFTs No results for input(s): BILITOT, BILIDIR, IBILI, ALKPHOS, AST, ALT, PROT, ALBUMIN in the last 72 hours. No results for input(s): LIPASE in the last 72 hours. PT/INR No results for input(s): LABPROT, INR in the last 72 hours.    Imaging Studies: Ct Abdomen Pelvis Wo Contrast  Result Date: 03/01/2017 CLINICAL DATA:  Oral only Abdominal pain with rectal bleeding Hx of htn,CKD III, hysterectomy,appendectomy/bbj EXAM: CT ABDOMEN AND PELVIS WITHOUT CONTRAST TECHNIQUE: Multidetector CT imaging of the abdomen and pelvis was performed following the standard protocol without IV contrast. COMPARISON:  06/24/2016 FINDINGS: Lower chest: Coronary and aortic calcifications. Transvenous pacing leads partially visualized. No pleural or pericardial effusion. Small hiatal hernia. Visualized lung bases clear. Hepatobiliary: Stable cysts in segments 3, 4B, and 6. No new liver abnormality is seen. Status post cholecystectomy. No biliary dilatation. Pancreas: Unremarkable. No pancreatic ductal dilatation or surrounding inflammatory changes. Spleen: Normal in size without focal abnormality. Adrenals/Urinary Tract: Small calcifications in the right renal collecting system, largest stone or cluster approximately 0.4 cm. No hydronephrosis. No ureterectasis. Urinary bladder is incompletely distended. Stomach/Bowel: Hiatal hernia. Stomach is nondilated. Small bowel is nondilated. Appendix reportedly surgically absent. The colon is nondistended. Multiple diverticula from distal descending and sigmoid portions of the colon, without adjacent inflammatory/edematous change or abscess. Vascular/Lymphatic: Heavy calcified plaque through the visualized distal descending thoracic and abdominal aorta without aneurysm. Extensive visceral and renal  arterial calcified plaque. Patchy bilateral iliofemoral arterial plaque. No abdominal or pelvic adenopathy localized. Reproductive: Status post hysterectomy. No adnexal masses. Other: No  ascites.  No free air. Musculoskeletal: Moderately severe thoracolumbar dextroscoliosis apex L1-2 with multilevel spondylitic changes throughout the lumbar spine. Chronic T12 compression deformity. No acute fracture or worrisome bone lesion. IMPRESSION: 1. No acute findings. 2. Right nephrolithiasis without hydronephrosis. 3. Hiatal hernia 4. Descending and sigmoid diverticulosis. 5. Extensive aortoiliac, coronary, visceral and renal artery calcified atheromatous plaque. 6. Lumbar scoliosis, multilevel spondylitic change, and chronic T12 compression deformity. Electronically Signed   By: Lucrezia Europe M.D.   On: 03/01/2017 14:28   Dg Chest 2 View  Result Date: 03/01/2017 CLINICAL DATA:  atrial fibrillation, diverticulosis, chronic diastolic CHF, essential hypertension, fibromyalgia, CKD stage III, complete heart block status post permanent pacemaker presented with one-week history of intermittent rectal bleeding that had worsened over the past 2-3 daysHISTORY OF CHF, HTN EXAM: CHEST - 2 VIEW COMPARISON:  06/24/2016 FINDINGS: Lungs hyperinflated, clear. Heart size upper limits normal. Tortuous atheromatous thoracic aorta. Stable left subclavian AICD. No effusion.  No pneumothorax. Diffuse osteopenia. Compression deformity near the thoracolumbar junction, present since 06/24/2016. IMPRESSION: 1. Stable borderline cardiomegaly.  No acute disease. 2.  Aortic Atherosclerosis (ICD10-170.0) Electronically Signed   By: Lucrezia Europe M.D.   On: 03/01/2017 14:54   US Venous Img Lower Bilateral  Result Date: 03/01/2017 CLINICAL DATA:  Pain at rest x months. EXAM: BILATERAL LOWER EXTREMITY VENOUS DOPPLER ULTRASOUND TECHNIQUE: Gray-scale sonography with compression, as well as color and duplex ultrasound, were performed to evaluate the deep venous system from the level of the common femoral vein through the popliteal and proximal calf veins. COMPARISON:  None FINDINGS: Normal compressibility of the common femoral,  superficial femoral, and popliteal veins, as well as the proximal calf veins. No filling defects to suggest DVT on grayscale or color Doppler imaging. Doppler waveforms show normal direction of venous flow, normal respiratory phasicity and response to augmentation. Visualized segments of the saphenous venous systems normal in caliber and compressibility. IMPRESSION: No evidence of  lower extremity deep vein thrombosis, bilaterally. Electronically Signed   By: Lucrezia Europe M.D.   On: 03/01/2017 14:03  [2 weeks]   Assessment:  83 y/o female admitted with low volume hematochezia. Modest drop in Hgb, but still within normal range. Chronic constipation and several weeks of lower abd discomfort. Has had CT with IV/oral contrast back in February and oral contrast only CT this admission without explanation for her abdominal pain.  EGD yesterday with diffuse moderate inflammation and edema noted in the stomach and to a lesser degree in the duodenal bulb.  Medium sized hiatal hernia.  On colonoscopy she had normal terminal ileum, 5 sessile polyps in the rectum, sigmoid colon, proximal transverse colon, ascending colon removed.  Multiple small and large mouth diverticula found in the rectosigmoid colon and sigmoid colon.  Internal hemorrhoids.  Unable to clear the cecum due to retained particular and matter that could not be aspirated.  It is noted that patient had several complaints of abdominal pain throughout the night.  Received Dilaudid as well as oxycodone.  Somewhat confused this morning.  Complains of abdominal pain unrelated to meals, worse with movement going on for at least several weeks if not months given the fact that she has had a couple of CT scans.  Hurts with palpation which goes against referred abdominal pain from her back.  Symptoms are not typical of mesenteric ischemia.  She is  adamant this morning that it is not really patient and thinks that we do not believe that she  hurts.  Plan: 1. Follow-up pathology.   2. Pantoprazole 40 mg daily. 3. Notified nursing staff, Cristopher Peru of patient's confusion this morning.  Further management by attending. 4. Would optimize bowel regimen as outpatient. Patient doesn't like the expensive "powder". She may need low dose Amitiza 92mcg 1-2 times daily with food.   Laureen Ochs. Bernarda Caffey [facility] [phone] 10/31/20188:58 AM     LOS: 0 days

## 2017-03-04 NOTE — BH Assessment (Addendum)
Tele Assessment Note   Patient Name: Kimberly [name] MRN: [id number] Referring Physician: Ilene Qua, MD Location of Patient: [facility] Location of Provider: [place] is an 83 y.o. female who [facility]/2017 due to rectal bleeding. Upon treatment and discharge scheduled for today, hospitalist reported that pt "repeatedly made comments about killing herself". Pt is pleasant and oriented for assessment. She denies any SI, HI, AVH. Pt admits to saying "maybe I should just kill myself" but strongly declares that "it was just a statement". Pt also declares "I have no intention of harming myself". Pt reports that she made the statement b/c she felt her "privacy was being invaded. They were taking things that belonged to me." It was clarified that pt was referring to after the hospitalist put her on suicide precautions and her room had to be cleared. Pt denies that she made any statements before this occurred.   Clinician spoke with Dr. Adair Patter. She shared that pt had been complaining about abdominal pain but, since there were no acute findings in the many tests performed, pt was being d/c w/ recommendation to f/u with her primary doctor. In response to this, pt said something to the effect of "I guess when I leave here, I'll go to a pawn shop and get a pistol to kill myself". When asked if she really meant this, pt replied with something like "No, I don't mean it, but if that's what I have to say to be taken seriously...". Upon realizing that the decision was still to d/c, pt again said something like, "I guess I have to go get that pistol".  Dr. Adair Patter shared that she doesn't really feel that pt is a danger to herself, but didn't want to discount her statements.   Dr. Dwyane Dee recommends pt be d/c with relevant OP referrals.    Diagnosis: Deferred  Past Medical History:  Past Medical History:  Diagnosis Date  . Acute blood loss  anemia 10/11/2012  . Acute diverticulitis 08/24/2013  . Acute on chronic combined systolic and diastolic CHF, NYHA class 4 ([place]) 11/15/2013  . Antral ulcer 10/11/2012  . Arrhythmia    atrial fibb  . Atrial fibrillation ([place])   . Cardiomyopathy, nonischemic ([place])   . Chronic anticoagulation 10/12/2012  . CKD (chronic kidney disease) stage 3, GFR 30-59 ml/min ([facility]) 10/12/2012  . Depression   . Erosive esophagitis 10/11/2012  . Fibromyalgia   . Glaucoma   . H/O echocardiogram 2007   EF 40-45%,         . Hypertension   . Osteoarthritis   . Pacemaker    Last saw cards 07/2013  . Scoliosis     Past Surgical History:  Procedure Laterality Date  . ABDOMINAL HYSTERECTOMY    . APPENDECTOMY    . BACK SURGERY    . BREAST SURGERY    . CARDIAC CATHETERIZATION  12/08/2005   LAD AND LEFT MAIN WITH NO HIGH-GRADE STENOSIS. MILD DISEASE IN THE CX AND LAD SYSTEM. SEVERE LV DYSFUNCTION WITH DILATION OF THE LV. EF 15-20%. LV END-DIASTOLIC PRESSURE IS 90. +1 MR.  . CHOLECYSTECTOMY    . CYSTOSCOPY N/A 02/24/2013   Procedure: CYSTOSCOPY WITH URETHRAL DILITATION;  Surgeon: Marissa Nestle, MD;  Location: [facility];  Service: Urology;  Laterality: N/A;  . DOPPLER ECHOCARDIOGRAPHY N/A 05/30/2010   LV SIZE IS NORMAL. LV SYSTOLIC FUNCTION IS LOW NORMAL. EF=50-55%. MILD INFERIOR HYPOKINESIS.MILD TO MODERATE POSTERIOR WALL HYPOKINESIS.PACEMAKER LEAD  IN THE RV. LA IS MILDLY DILATED. RA IS MODERATE TO SEVERLY DILATED. PACEMAKER LEAD IN THE RA. MILD CALCICICATION OF THE MV APPARATUS. MODERATE MR. MILD TO MODERATE TR. MILD PHTN.AV MILDLY SCLEROTIC.  [name] ESOPHAGOGASTRODUODENOSCOPY N/A 10/13/2012   Dr. Gala Romney: severe ulcerative reflux esophagitis, question of Barrett's but negative path, single deep prepyloric antral ulcer, negative H.pylori  . HERNIA REPAIR     right inguinal hernia and umbilical  . LOWETR EXT VENOUS Bilateral 11-08-10   R & L- NO EVIDENCE OF THROMBUS OR THROMBOPHLEBITIS. THERE IS MILD AMOUNT OF SUBCUTANEOUS EDEMA  NOTED WITHIN THE LEFT CALF AND ANKLE. R & L GSV AND SSV- NO VENOUS INSUFF NOTED.  [name] NECK SURGERY    . NUCLEAR STRESS TEST N/A 02/13/2009   NORMAL PATTERN OF PERFUSION IN ALL REGIONS. POST STRESS VENTICULAR SIZE IS NORMAL. POST  STESS EF 85%.  NORMAL MYOCARDIAL PERFUSION STUDY.  [name] PACEMAKER INSERTION    . TONSILLECTOMY    . YAG LASER APPLICATION Bilateral 0/34/7425   Procedure: YAG LASER APPLICATION;  Surgeon: Williams Che, MD;  Location: [facility];  Service: Ophthalmology;  Laterality: Bilateral;    Family History:  Family History  Problem Relation Age of Onset  . Cancer Sister   . Asthma Sister   . Heart failure Brother   . Pulmonary embolism Brother   . Cancer Brother   . Colon cancer Neg Hx     Social History:  reports that she quit smoking about 12 years ago. Her smoking use included Cigarettes. She has a 15.00 pack-year smoking history. She has never used smokeless tobacco. She reports that she drinks alcohol. She reports that she does not use drugs.  Additional Social History:  Alcohol / Drug Use Pain Medications: see PTA meds Prescriptions: see PTA meds Over the Counter: see PTA meds History of alcohol / drug use?: No history of alcohol / drug abuse  CIWA: CIWA-Ar BP: (!) 136/51 Pulse Rate: 71 COWS:    PATIENT STRENGTHS: (choose at least two) Average or above average intelligence Capable of independent living Communication skills  Allergies:  Allergies  Allergen Reactions  . Penicillins Hives    Has patient had a PCN reaction causing immediate rash, facial/tongue/throat swelling, SOB or lightheadedness with hypotension: Yes Has patient had a PCN reaction causing severe rash involving mucus membranes or skin necrosis: No Has patient had a PCN reaction that required hospitalization No Has patient had a PCN reaction occurring within the last 10 years: Yes If all of the above answers are "NO", then may proceed with Cephalosporin use.   . Sulfa Antibiotics Rash     Home Medications:  Medications Prior to Admission  Medication Sig Dispense Refill  . acetaminophen (TYLENOL) 500 MG tablet Take 500 mg by mouth every 6 (six) hours as needed.    . carvedilol (COREG) 6.25 MG tablet TAKE 1 TABLET BY MOUTH TWO  TIMES DAILY WITH A MEAL 180 tablet 0  . ELIQUIS 2.5 MG TABS tablet TAKE 1 TABLET BY MOUTH  TWICE A DAY 180 tablet 1  . furosemide (LASIX) 20 MG tablet Take 1 tablet (20 mg total) by mouth daily. 90 tablet 3  . Multiple Vitamins-Minerals (ICAPS AREDS 2) CAPS Take 2 capsules by mouth daily.    Vladimir Faster Glycol-Propyl Glycol (SYSTANE ULTRA) 0.4-0.3 % SOLN Place 1-2 drops into both eyes 2 (two) times daily as needed.      OB/GYN Status:  No LMP recorded. Patient has had a hysterectomy.  General Assessment Data  Location of Assessment: [facility] Assessment Services TTS Assessment: In system Is this a Tele or Face-to-Face Assessment?: Tele Assessment Is this an Initial Assessment or a Re-assessment for this encounter?: Initial Assessment Marital status: Widowed Is patient pregnant?: No Pregnancy Status: No Living Arrangements: Alone Can pt return to current living arrangement?: Yes Admission Status: Voluntary Is patient capable of signing voluntary admission?: Yes Referral Source: MD Insurance type: [employer] [facility]     Crisis Care Plan Living Arrangements: Alone Name of Psychiatrist: none Name of Therapist: none  Education Status Is patient currently in school?: No  Risk to self with the past 6 months Suicidal Ideation: No Has patient been a risk to self within the past 6 months prior to admission? : No Suicidal Intent: No Has patient had any suicidal intent within the past 6 months prior to admission? : No Is patient at risk for suicide?: No Suicidal Plan?: No Has patient had any suicidal plan within the past 6 months prior to admission? : No Access to Means: No Previous Attempts/Gestures: No Intentional Self Injurious Behavior: None Family  Suicide History: Unknown Recent stressful life event(s): Other (Comment) Persecutory voices/beliefs?: No Depression: No Substance abuse history and/or treatment for substance abuse?: No Suicide prevention information given to non-admitted patients: Not applicable  Risk to Others within the past 6 months Homicidal Ideation: No Does patient have any lifetime risk of violence toward others beyond the six months prior to admission? : No Thoughts of Harm to Others: No Current Homicidal Intent: No Current Homicidal Plan: No Access to Homicidal Means: No History of harm to others?: No Assessment of Violence: None Noted Does patient have access to weapons?: No Criminal Charges Pending?: No Does patient have a court date: No Is patient on probation?: No  Psychosis Hallucinations: None noted Delusions: None noted  Mental Status Report Appearance/Hygiene: Unremarkable Eye Contact: Good Motor Activity: Unremarkable Speech: Logical/coherent Level of Consciousness: Alert Mood: Pleasant, Euthymic Affect: Appropriate to circumstance Anxiety Level: Minimal Thought Processes: Coherent, Relevant Judgement: Unimpaired Orientation: Person, Place, Time, Situation Obsessive Compulsive Thoughts/Behaviors: None  Cognitive Functioning Concentration: Normal Memory: Recent Intact, Remote Intact IQ: Average Insight: Good Impulse Control: Unable to Assess Appetite: Good Sleep: No Change Vegetative Symptoms: None  ADLScreening [facility] Assessment Services) Patient's cognitive ability adequate to safely complete daily activities?: Yes Patient able to express need for assistance with ADLs?: No Independently performs ADLs?: Yes (appropriate for developmental age)  Prior Inpatient Therapy Prior Inpatient Therapy: No  Prior Outpatient Therapy Prior Outpatient Therapy: Yes Prior Therapy Dates: over 10 years ago Does patient have an ACCT team?: No Does patient have Intensive In-House Services?  :  No Does patient have [facility] services? : No Does patient have P4CC services?: No  ADL Screening (condition at time of admission) Patient's cognitive ability adequate to safely complete daily activities?: Yes Is the patient deaf or have difficulty hearing?: No Does the patient have difficulty seeing, even when wearing glasses/contacts?: No Does the patient have difficulty concentrating, remembering, or making decisions?: Yes Patient able to express need for assistance with ADLs?: No Does the patient have difficulty dressing or bathing?: Yes Independently performs ADLs?: Yes (appropriate for developmental age) Communication: Independent Dressing (OT): Needs assistance Is this a change from baseline?: Pre-admission baseline Grooming: Independent Feeding: Independent Bathing: Independent Toileting: Independent In/Out Bed: Independent Walks in Home: Independent with device (comment) Does the patient have difficulty walking or climbing stairs?: Yes Weakness of Legs: Both Weakness of Arms/Hands: Both  Home Assistive Devices/Equipment Home Assistive Devices/Equipment: Bedside  commode/3-in-1, Walker (specify type)  Therapy Consults (therapy consults require a physician order) PT Evaluation Needed: No OT Evalulation Needed: No SLP Evaluation Needed: No Abuse/Neglect Assessment (Assessment to be complete while patient is alone) Physical Abuse: Denies Verbal Abuse: Denies Sexual Abuse: Denies Exploitation of patient/patient's resources: Denies Self-Neglect: Denies Possible abuse reported to:: [place] Social Work Values / Beliefs Cultural Requests During Hospitalization: None Spiritual Requests During Hospitalization: None Consults Spiritual Care Consult Needed: No Social Work Consult Needed: No Regulatory affairs officer (For Healthcare) Does Patient Have a Medical Advance Directive?: Yes Does patient want to make changes to medical advance directive?: Yes (ED - Information included in  AVS) Type of Advance Directive: Living will Copy of [place] in Chart?: Yes Copy of Living Will in Chart?: No - copy requested Nutrition Screen- [place] Adult/[facility] Patient's home diet: NPO Has the patient recently lost weight without trying?: No Has the patient been eating poorly because of a decreased appetite?: No Malnutrition Screening Tool Score: 0  Additional Information 1:1 In Past 12 Months?: No CIRT Risk: No Elopement Risk: No Does patient have medical clearance?: Yes     Disposition:  Disposition Initial Assessment Completed for this Encounter: Yes Disposition of Patient: Pending Review with psychiatrist  This service was provided via telemedicine using a 2-way, interactive audio and video technology.  Names of all persons participating in this telemedicine service and their role in this encounter. Name: Lattie Haw Role: pt's RN  Name: Lilia Pro Role: charge RN  Name: Edwena Blow Role: nurse tech    Aldona Bar Geni Bers 03/04/2017 1:08 PM

## 2017-03-04 NOTE — Evaluation (Signed)
Physical Therapy Evaluation Patient Details Name: Kimberly [name] MRN: [id number] DOB: 1926-08-16 Today's Date: 03/04/2017   History of Present Illness  83 year old female with a history of permanent atrial fibrillation, diverticulosis, chronic diastolic CHF, essential hypertension, fibromyalgia, CKD stage III, complete heart block status post permanent pacemaker presented with one-week history of intermittent rectal bleeding that had worsened over the past 2-3 days.  The patient states that she normally struggles with constipation, and she states that in the past week she has had to [name] more to have bowel movement.  Associated with this, the patient has had some worsening of her rectal bleeding and abdominal pain.  She also has a sensation of incomplete evacuation.  She stated that her abdominal pain has been intermittent this past week, but has become more constant in the past 2-3 days prior to admission.  She denies any fevers, chills, chest pain, nausea, vomiting, diarrhea.  However she endorses some dyspnea on exertion which she states has been the same as usual.  Upon presentation, the patient was afebrile hemodynamically stable with hemoglobin of 13.5.  Her last apixaban dose was on the morning of February 28, 2017.     Clinical Impression  Kimberly [name] is a 83 y.o. female presenting for PT evaluation with abdominal pain. Patient received semi-supine in bed, agreeable to therapy. Kimberly [name] reports she is independent for ADL's, self care, gait, transfers, and all mobility around her home with a front wheeled walker. She currently requires supervision/min guard for transfers and gait with front wheeled walker due to unsteadiness and decreased activity tolerance. Kimberly [name] live alone and will benefit from ongoing skilled PT in below setting as transition to OP PT to improve functional independence and address below impairments: poor balance, decreased activity tolerance, general LE weakness, decreased  muscular endurance, and decreased safety awareness. Acute PT will follow throughout stay.      Follow Up Recommendations Home health PT    Equipment Recommendations  None recommended by PT (patient has all necessary equipment)    Recommendations for Other Services       Precautions / Restrictions Precautions Precautions: Fall Restrictions Weight Bearing Restrictions: No      Mobility  Bed Mobility Overal bed mobility: Modified Independent    General bed mobility comments: patient requires extra time and HOB slightly elevated, patient abel to remove sheets independently  Transfers Overall transfer level: Needs assistance Equipment used: Rolling walker (2 wheeled) Transfers: Sit to/from [facility] Sit to Stand: Supervision Stand pivot transfers: Supervision    General transfer comment: patient telling therapist not to cue her "I know what to do" when therapist trying to encourage safe hand position and sequencing, overall patient maintained safe sequence thorughout transfers  Ambulation/Gait Ambulation/Gait assistance: Supervision;Min guard Ambulation Distance (Feet): 10 Feet Assistive device: Rolling walker (2 wheeled) Gait Pattern/deviations: Step-through pattern;Decreased stride length;Trunk flexed     General Gait Details: patient with slightly unsteady gait and forward flexed posture throughout gait      Balance Overall balance assessment: Modified Independent Sitting-balance support: Feet supported;Bilateral upper extremity supported Sitting balance-Leahy Scale: Fair    Standing balance support: Bilateral upper extremity supported Standing balance-Leahy Scale: Fair       Pertinent Vitals/Pain Pain Assessment: Faces Pain Score: 10-Worst pain ever Faces Pain Scale: Hurts a little bit Pain Location: right abdomen Pain Intervention(s): Limited activity within patient's tolerance;Monitored during session    [place]  expects to be discharged to:: Private residence Living Arrangements: Alone Available  Help at Discharge: Family Type of Home: House Home Access:  (curb/1 step to enter house, no rails)  Home Layout: One level Home Equipment: [place] - 2 wheels;Bedside commode      Prior Function Level of Independence: Independent with assistive device(s)     Comments: pateint reports she is independent in her hom for mobility and ADL's using a front wheeled walker, she does not drive and pays a friends to take her places, she does her own grocery shopping and uses a scooter at the foodstore. Patient states she is independent "I've always done for myself".     Hand Dominance   Dominant Hand: Right    Extremity/Trunk Assessment   Upper Extremity Assessment Upper Extremity Assessment: Overall WFL for tasks assessed    Lower Extremity Assessment Lower Extremity Assessment: Overall WFL for tasks assessed    Cervical / Trunk Assessment Cervical / Trunk Assessment: Normal  Communication   Communication: No difficulties  Cognition Arousal/Alertness: Awake/alert Behavior During Therapy: WFL for tasks assessed/performed Overall Cognitive Status: Within Functional Limits for tasks assessed    General Comments: patient with labile attitude in therapy but mostly positive making intermittent negative comments such as "I feel like I am being treated worse than an animal". Patient with slight paranoia asking therapist to hide her personal belonging under her coat in the cabinet. At end of session patient pleasant with therapiststating " thank you" and agreeing more therapy would be beneficial.          Exercises General Exercises - Lower Extremity Ankle Circles/Pumps: AROM;Strengthening;Both;10 reps;Seated Long Arc Quad: Seated;AROM;Both;10 reps Hip Flexion/Marching: Seated;AROM;Strengthening;Both;10 reps (patient states painful along right abdomen with RLE, therapist instructed only move thorugh pain free  range)   Assessment/Plan    PT Assessment Patient needs continued PT services  PT Problem List Decreased strength;Decreased activity tolerance;Decreased balance;Decreased mobility;Decreased safety awareness       PT Treatment Interventions Gait training;DME instruction;Stair training;Functional mobility training;Therapeutic activities;Therapeutic exercise;Balance training;Patient/family education    PT Goals (Current goals can be found in the Care Plan section)  Acute Rehab PT Goals Patient Stated Goal: get stronger and go back home PT Goal Formulation: With patient Time For Goal Achievement: 03/09/17 Potential to Achieve Goals: Good    Frequency Min 3X/week   Barriers to discharge           AM-PAC PT "6 Clicks" Daily Activity  Outcome Measure Difficulty turning over in bed (including adjusting bedclothes, sheets and blankets)?: None Difficulty moving from lying on back to sitting on the side of the bed? : None Difficulty sitting down on and standing up from a chair with arms (e.g., wheelchair, bedside commode, etc,.)?: A Little Help needed moving to and from a bed to chair (including a wheelchair)?: A Little Help needed walking in hospital room?: A Little Help needed climbing 3-5 steps with a railing? : A Little 6 Click Score: 20    End of Session Equipment Utilized During Treatment:  (front wheeled walker) Activity Tolerance: Patient tolerated treatment well;No increased pain Patient left: in chair;with call bell/phone within reach (with tray table in front and breakfast) Nurse Communication: Mobility status PT Visit Diagnosis: Unsteadiness on feet (R26.81);Other abnormalities of gait and mobility (R26.89);Muscle weakness (generalized) (M62.81);Difficulty in walking, not elsewhere classified (R26.2);Pain Pain - Right/Left: Right Pain - part of body:  (abdomen)    Time: 6712-4580 PT Time Calculation (min) (ACUTE ONLY): 40 min   Charges:   PT Evaluation $PT Eval Low  Complexity: 1 Low PT Treatments $  Therapeutic Activity: 23-37 mins   PT G Codes:   PT G-Codes **NOT FOR INPATIENT CLASS** Functional Assessment Tool Used: AM-PAC 6 Clicks Basic Mobility Functional Limitation: Mobility: Walking and moving around Mobility: Walking and Moving Around Current Status (V6160): At least 20 percent but less than 40 percent impaired, limited or restricted Mobility: Walking and Moving Around Goal Status [phone]): At least 20 percent but less than 40 percent impaired, limited or restricted Mobility: Walking and Moving Around Discharge Status [phone]): At least 20 percent but less than 40 percent impaired, limited or restricted    Debara Pickett, PT, DPT Physical Therapist with [facility]  03/04/2017 1:36 PM

## 2017-03-04 NOTE — Progress Notes (Signed)
Discussed with Dr. Gala Romney. Advised to obtain CTA a/p to look for mesenteric ischemia due to chronic abd pain.   Laureen Ochs. Bernarda Caffey [facility] [phone] 10/31/20181:13 PM

## 2017-03-04 NOTE — Plan of Care (Signed)
Problem: Pain Managment: Goal: General experience of comfort will improve Outcome: Not Progressing Pt has stated all night she is having 10/10 pain in her abdomen. Oxycodone was unsuccessful in treating the pain, so Dilaudid given per MD order. Pt now resting quietly in room. Will continue to monitor pt.

## 2017-03-04 NOTE — Progress Notes (Signed)
Pt c/o 10/10 pain after receiving Oxycodone 5mg , stating her pain has not gotten any better. RN adv pt that she has discharge orders for tomorrow and its doubtful she will get anything stronger for pain. RN went over findings from procedures yesterday and adv pt that her findings shouldn't be causing her R sided abdominal pain. Pt stated "I'm not faking and if you send me home, there will be a lawsuit." Rn adv pt that it would be notated.  Pt stating 10/10 pain but is continuously talking non-stop about her appendectomy years ago. Will continue to monitor pt

## 2017-03-04 NOTE — Progress Notes (Signed)
RN spoke with Dr. Olevia Bowens who was at the desk and adv him of pt complaints of 10/10 abdominal pain. Adv that pts Dilaudid order had expired earlier in the shift and pt stated that oxycodone 5mg  po had not worked. New order for Dilaudid 0.5mg  IV q4h Prn. Will give to pt and continue to monitor. Also, pt has had 2 incontinent episodes in bed after getting for the past 3 days to the [facility]. When RN asked pt about this, pt stated, "I'm not Faking!". RN explained that I didn't say she was faking, I just wanted to know what we needed to do to help her with this. Pt then started crying in room for 10 seconds, when RN walked out she immediately stopped. RN placed purewick per pt request. Will continue to monitor.

## 2017-03-04 NOTE — Progress Notes (Signed)
Pt has called multiple times for pain "shot". When RN took Dilaudid to pt, pt refused stating the doctor would be in soon and the medication made her feel loopy. RN offered pt her oxycodone, pt refused. Medication wasted in pyxis.

## 2017-03-04 NOTE — Progress Notes (Signed)
Enteric Precautions discontinued d/t no isolation when RN came in on shift, and no diarrhea per pt.

## 2017-03-04 NOTE — Progress Notes (Signed)
Patient's daughter called, her name is Santiago Glad, to check on patient's status.  Her daughter informed me that patient is bipolar and has occasional manic episodes.  States that her mom is suppose to be on medication for the bipolar, but she is not sure if she takes it.  Daughter also informed me that patient attempted suicide 6 years ago by overdosing on sleep medication.

## 2017-03-05 ENCOUNTER — Encounter: Payer: Self-pay | Admitting: Family Medicine

## 2017-03-05 DIAGNOSIS — N183 Chronic kidney disease, stage 3 (moderate): Secondary | ICD-10-CM | POA: Diagnosis not present

## 2017-03-05 DIAGNOSIS — Z7901 Long term (current) use of anticoagulants: Secondary | ICD-10-CM | POA: Diagnosis not present

## 2017-03-05 DIAGNOSIS — I442 Atrioventricular block, complete: Secondary | ICD-10-CM | POA: Diagnosis not present

## 2017-03-05 DIAGNOSIS — I10 Essential (primary) hypertension: Secondary | ICD-10-CM | POA: Diagnosis not present

## 2017-03-05 DIAGNOSIS — K921 Melena: Secondary | ICD-10-CM | POA: Diagnosis not present

## 2017-03-05 DIAGNOSIS — K625 Hemorrhage of anus and rectum: Secondary | ICD-10-CM | POA: Diagnosis not present

## 2017-03-05 DIAGNOSIS — I5032 Chronic diastolic (congestive) heart failure: Secondary | ICD-10-CM | POA: Diagnosis not present

## 2017-03-05 DIAGNOSIS — K279 Peptic ulcer, site unspecified, unspecified as acute or chronic, without hemorrhage or perforation: Secondary | ICD-10-CM | POA: Diagnosis not present

## 2017-03-05 DIAGNOSIS — R1030 Lower abdominal pain, unspecified: Secondary | ICD-10-CM | POA: Diagnosis not present

## 2017-03-05 NOTE — Progress Notes (Addendum)
Patient irritable and paranoid, but cooperative with assessment and taking med. Patient refused VS. Patient refused for IV to be flushed, stating that IV was flushed previous afternoon and does not need flushed again. Patient repeatedly stating that her meds are being withheld, complaining about several different things. Earlier, this RN notified daughter that paient was not being discharged today. Sitter in room with patient.

## 2017-03-05 NOTE — Care Management Note (Signed)
Case Management Note  Patient Details  Name: Kimberly [name] MRN: [id number] Date of Birth: November 29, 1926  Expected Discharge Date:  03/05/17               Expected Discharge Plan:  [place]  In-House Referral:  NA  Discharge planning Services  CM Consult  Post Acute Care Choice:  Home Health Choice offered to:  Patient  HH Arranged:  PT [facility] Agency:    [facility]  Status of Service:  Completed, signed off  Additional Comments: dischargng home today. PT has recommended HH PT and pt agreeable. She has requested [facility] be agency referred. Pt aware they have 48 hrs to make first visit. Mered[facility] Ide rep aware of referral and will obtain pt info from chart.   Sherald Barge, RN 03/05/2017, 1:51 PM

## 2017-03-05 NOTE — Progress Notes (Signed)
Patient started as very pleasant when this RN entered room, saying "good morning" and "how are you?". Patient cooperative taking Protonix. Then, patient asking where her walker is, and asking who stole it. Patient did not believe when this RN stated that her walker was not present 3 days ago. Gilford Rile is not on belongings at bedside list. Patient did cooperate to get her weight, but was complaining about the scale being to far from the bed and this RN needed to be considerate. Patient continued to get more agitated about her walker. Patient stated this RN was speaking to her like a child. Patient stated "she is the most hateful person I have ever known". Sitter still in room with patient at this time. Patient refused for NT to get VS again this AM.

## 2017-03-05 NOTE — Discharge Summary (Signed)
Physician Discharge Summary  Kimberly [name] QMV:[id number] DOB: [birth year] DOA: 02/28/2017  PCP: Wardell Honour, MD  Admit date: 02/28/2017 Discharge date: 03/04/2017  Admitted From: Home Disposition:  Home   Recommendations for Outpatient Follow-up:  1. Follow up with PCP in 1-2 weeks 2. Please obtain BMP/CBC in one week 3.  Continue PPI daily    Discharge Condition: Stable CODE STATUS: DNR Diet recommendation: Heart Healthy   Brief/Interim Summary: 83 year old female with a history of permanent atrial fibrillation, diverticulosis, chronic diastolic CHF, essential hypertension, fibromyalgia, CKD stage III, complete heart block status post permanent pacemaker presented with one-week history of intermittent rectal bleeding that had worsened over the past 2-3 days. The patient states that she normally struggles with constipation, and she states that in the past week she has had to strain more to have bowel movement. Associated with this, the patient has had some worsening of her rectal bleeding and abdominal pain. She also has a sensation of incomplete evacuation. She stated that her abdominal pain has been intermittent this past week, but has become more constant in the past 2-3 days prior to admission. She denies any fevers, chills, chest pain, nausea, vomiting, diarrhea. However she endorses some dyspnea on exertion which she states has been the same as usual. Upon presentation, the patient was afebrile hemodynamically stable with hemoglobin of 13.5. Her last apixaban dose was on the morning of February 28, 2017.  Was initially to be discharged on 03/03/17 but held discharge 2/2 patient having some lasting effects of conscious sedation.  On 03/04/17 she made numerous threats of self harm when she was told she was to be discharged.  She was cleared by telepsych on 10/31 and was discharged on 11/1.  Discharge Diagnoses:  Hematochezia/abdominal pain -Concerned about ischemic  colitis vs diverticular bleed -no further bleeding since admission -CT abdomen and pelvis--diverticulosis of the descending and sigmoid colon, right nephrolithiasis without hydronephrosis, multilevel spondylitic changes in the lumbar spine.  T12 chronic compression deformity. -GI consult appreciated-->colonoscopy 10/30; indefinite PPI per GI (See Dr. Roseanne Kaufman note) - apixaban restarted -October 13, 2012 EGD--severe ulcerative reflux esophagitis, gastric erosions with a single antral ulcer  -constipation management per GI 03/03/17 colonoscopy--5 polyps removed, diverticulosis in the rectosigmoid colon, internal hemorrhoids -Discharge was held until Mar 05, 2017 because on October 30 the patient was not safe for discharge as she lives by herself and has no help, and the patient still had some effects of conscious sedation in the late afternoon March 03, 2017. Held again on October 31 secondary to numerous threats of self harm  Heartburn/Atypical Chest pain -EKG--paced - troponins--neg x 2 -GI cocktail--helped  Chronic diastolic CHF -Daily weights--stable -Continue home dose furosemide -November 28, 2014 Echo EF 60-65% -Continue carvedilol   Permanent atrial fibrillation -Rate controlled -holding apixaban initially in the setting of rectal bleeding -Status post AV node ablation-->CHB-->PPM -continue carvedilol  Dyspnea on exertion -Chest x-ray--neg -100% on RA  CKD stage III -Baseline creatinine 1.2-1.6 -stable  Essential hypertension -Continue carvedilol  Complete heart block -Status post permanent pacemaker January 2012  Lower extremity pain and edema -venous duplex--neg    Discharge Instructions  Discharge Instructions    Call MD for:  difficulty breathing, headache or visual disturbances    Complete by:  As directed    Call MD for:  extreme fatigue    Complete by:  As directed    Call MD for:  hives    Complete by:  As directed  Call MD for:  persistant  dizziness or light-headedness    Complete by:  As directed    Call MD for:  persistant nausea and vomiting    Complete by:  As directed    Call MD for:  redness, tenderness, or signs of infection (pain, swelling, redness, odor or green/yellow discharge around incision site)    Complete by:  As directed    Call MD for:  severe uncontrolled pain    Complete by:  As directed    Call MD for:  temperature >100.4    Complete by:  As directed    Diet - low sodium heart healthy    Complete by:  As directed    Increase activity slowly    Complete by:  As directed      Allergies as of 03/05/2017      Reactions   Penicillins Hives   Has patient had a PCN reaction causing immediate rash, facial/tongue/throat swelling, SOB or lightheadedness with hypotension: Yes Has patient had a PCN reaction causing severe rash involving mucus membranes or skin necrosis: No Has patient had a PCN reaction that required hospitalization No Has patient had a PCN reaction occurring within the last 10 years: Yes If all of the above answers are "NO", then may proceed with Cephalosporin use.   Sulfa Antibiotics Rash      Medication List    TAKE these medications   acetaminophen 500 MG tablet Commonly known as:  TYLENOL Take 500 mg by mouth every 6 (six) hours as needed.   carvedilol 6.25 MG tablet Commonly known as:  COREG TAKE 1 TABLET BY MOUTH TWO  TIMES DAILY WITH A MEAL   ELIQUIS 2.5 MG Tabs tablet Generic drug:  apixaban TAKE 1 TABLET BY MOUTH  TWICE A DAY   furosemide 20 MG tablet Commonly known as:  LASIX Take 1 tablet (20 mg total) by mouth daily.   ICAPS AREDS 2 Caps Take 2 capsules by mouth daily.   pantoprazole 40 MG tablet Commonly known as:  PROTONIX Take 1 tablet (40 mg total) by mouth daily.   SYSTANE ULTRA 0.4-0.3 % Soln Generic drug:  Polyethyl Glycol-Propyl Glycol Place 1-2 drops into both eyes 2 (two) times daily as needed.       Allergies  Allergen Reactions  .  Penicillins Hives    Has patient had a PCN reaction causing immediate rash, facial/tongue/throat swelling, SOB or lightheadedness with hypotension: Yes Has patient had a PCN reaction causing severe rash involving mucus membranes or skin necrosis: No Has patient had a PCN reaction that required hospitalization No Has patient had a PCN reaction occurring within the last 10 years: Yes If all of the above answers are "NO", then may proceed with Cephalosporin use.   . Sulfa Antibiotics Rash    Consultations:  GI   Procedures/Studies: Ct Abdomen Pelvis Wo Contrast  Result Date: 03/01/2017 CLINICAL DATA:  Oral only Abdominal pain with rectal bleeding Hx of htn,CKD III, hysterectomy,appendectomy/bbj EXAM: CT ABDOMEN AND PELVIS WITHOUT CONTRAST TECHNIQUE: Multidetector CT imaging of the abdomen and pelvis was performed following the standard protocol without IV contrast. COMPARISON:  06/24/2016 FINDINGS: Lower chest: Coronary and aortic calcifications. Transvenous pacing leads partially visualized. No pleural or pericardial effusion. Small hiatal hernia. Visualized lung bases clear. Hepatobiliary: Stable cysts in segments 3, 4B, and 6. No new liver abnormality is seen. Status post cholecystectomy. No biliary dilatation. Pancreas: Unremarkable. No pancreatic ductal dilatation or surrounding inflammatory changes. Spleen: Normal in size without focal  abnormality. Adrenals/Urinary Tract: Small calcifications in the right renal collecting system, largest stone or cluster approximately 0.4 cm. No hydronephrosis. No ureterectasis. Urinary bladder is incompletely distended. Stomach/Bowel: Hiatal hernia. Stomach is nondilated. Small bowel is nondilated. Appendix reportedly surgically absent. The colon is nondistended. Multiple diverticula from distal descending and sigmoid portions of the colon, without adjacent inflammatory/edematous change or abscess. Vascular/Lymphatic: Heavy calcified plaque through the  visualized distal descending thoracic and abdominal aorta without aneurysm. Extensive visceral and renal arterial calcified plaque. Patchy bilateral iliofemoral arterial plaque. No abdominal or pelvic adenopathy localized. Reproductive: Status post hysterectomy. No adnexal masses. Other: No ascites.  No free air. Musculoskeletal: Moderately severe thoracolumbar dextroscoliosis apex L1-2 with multilevel spondylitic changes throughout the lumbar spine. Chronic T12 compression deformity. No acute fracture or worrisome bone lesion. IMPRESSION: 1. No acute findings. 2. Right nephrolithiasis without hydronephrosis. 3. Hiatal hernia 4. Descending and sigmoid diverticulosis. 5. Extensive aortoiliac, coronary, visceral and renal artery calcified atheromatous plaque. 6. Lumbar scoliosis, multilevel spondylitic change, and chronic T12 compression deformity. Electronically Signed   By: Lucrezia Europe M.D.   On: 03/01/2017 14:28   Dg Chest 2 View  Result Date: 03/01/2017 CLINICAL DATA:  atrial fibrillation, diverticulosis, chronic diastolic CHF, essential hypertension, fibromyalgia, CKD stage III, complete heart block status post permanent pacemaker presented with one-week history of intermittent rectal bleeding that had worsened over the past 2-3 daysHISTORY OF CHF, HTN EXAM: CHEST - 2 VIEW COMPARISON:  06/24/2016 FINDINGS: Lungs hyperinflated, clear. Heart size upper limits normal. Tortuous atheromatous thoracic aorta. Stable left subclavian AICD. No effusion.  No pneumothorax. Diffuse osteopenia. Compression deformity near the thoracolumbar junction, present since 06/24/2016. IMPRESSION: 1. Stable borderline cardiomegaly.  No acute disease. 2.  Aortic Atherosclerosis (ICD10-170.0) Electronically Signed   By: Lucrezia Europe M.D.   On: 03/01/2017 14:54   US Venous Img Lower Bilateral  Result Date: 03/01/2017 CLINICAL DATA:  Pain at rest x months. EXAM: BILATERAL LOWER EXTREMITY VENOUS DOPPLER ULTRASOUND TECHNIQUE: Gray-scale  sonography with compression, as well as color and duplex ultrasound, were performed to evaluate the deep venous system from the level of the common femoral vein through the popliteal and proximal calf veins. COMPARISON:  None FINDINGS: Normal compressibility of the common femoral, superficial femoral, and popliteal veins, as well as the proximal calf veins. No filling defects to suggest DVT on grayscale or color Doppler imaging. Doppler waveforms show normal direction of venous flow, normal respiratory phasicity and response to augmentation. Visualized segments of the saphenous venous systems normal in caliber and compressibility. IMPRESSION: No evidence of  lower extremity deep vein thrombosis, bilaterally. Electronically Signed   By: Lucrezia Europe M.D.   On: 03/01/2017 14:03   Ct Angio Abd/pel W/ And/or W/o  Result Date: 03/04/2017 CLINICAL DATA:  Rectal bleeding, worse during the past 2-3 days. History of chronic diastolic CHF, hypertension, stage 3 kidney disease in a fibrillation peer EXAM: CTA ABDOMEN AND PELVIS WITH CONTRAST TECHNIQUE: Multidetector CT imaging of the abdomen and pelvis was performed using the standard protocol during bolus administration of intravenous contrast. Multiplanar reconstructed images and MIPs were obtained and reviewed to evaluate the vascular anatomy. CONTRAST:  100 cc Isovue 370 COMPARISON:  03/01/2017; 06/24/2016;  11/01/2013 FINDINGS: VASCULAR Aorta: Moderate amount of mixed calcified and noncalcified atherosclerotic plaque within a tortuous but normal caliber abdominal aorta, not resulting in a hemodynamically significant stenosis. No evidence of abdominal aortic dissection or periaortic stranding. Celiac: Mixed calcified and noncalcified atherosclerotic plaque involving the origin the celiac artery approaches 50%  luminal narrowing. SMA: There is a moderate amount of mixed calcified and noncalcified atherosclerotic plaque involving the main trunk of the SMA not definitely  resulting in hemodynamically significant stenosis. Incidentally noted replaced right hepatic artery arises from the proximal SMA. The distal tributaries of the SMA appear patent without discrete intraluminal filling defect suggest distal embolism with special attention paid to the branch vessels supplying the area of wall thickening involving the sigmoid colon. Renals: Solitary bilaterally. There is a moderate amount of mixed calcified and noncalcified atherosclerotic plaque involving the origin of the bilateral renal artery's which approaches approximately 50% luminal narrowing bilaterally. No vessel irregularity to suggest FMD. IMA: Diseased at its origin and proximally though remains patent. Inflow: There is a moderate amount of mixed calcified and noncalcified atherosclerotic plaque within the bilateral normal caliber common and external iliac arteries, not resulting in a hemodynamically significant stenosis. The bilateral internal iliac artery's are disease though patent and of normal caliber. Proximal Outflow: The bilateral common femoral artery is appear widely patent. Potential hemodynamically significant stenosis involving the proximal right SFA, incompletely imaged (image 227, series 6). Veins: The pelvic venous system and IVC appear widely patent. Review of the MIP images confirms the above findings. NON-VASCULAR Lower chest: Limited visualization of the lower thorax is negative for focal airspace opacity or pleural effusion. Cardiomegaly, in particular, there is enlargement of the right atrium. Pacemaker lead tips terminate within the right atrium, ventricle and coronary sinus. Coronary artery calcifications. No pericardial effusion. Hepatobiliary: Normal hepatic contour. Re- demonstrated multiple hypoattenuating hepatic cysts with dominant cysts within the caudal aspect of the medial segment of the left lower liver measuring approximately 1.7 cm in diameter. No discrete worrisome hepatic lesions. Post  cholecystectomy. No definite intra extrahepatic bili duct dilatation. No ascites. Pancreas: Normal appearance of the pancreas. Spleen: Normal appearance of the spleen. Adrenals/Urinary Tract: There is symmetric enhancement of the bilateral kidneys. No definite renal stones this postcontrast examination. No discrete renal lesions. No urine obstruction or perinephric stranding. Normal appearance of the bilateral adrenal glands. Normal appearance of the urinary bladder given degree of distention. Stomach/Bowel: Rather extensive colonic diverticulosis without evidence of diverticulitis. Apparent circumferential wall thickening involving the mid ascending colon extending for a distance of approximately 6.3 cm (coronal image 33, series 11). Mild distension of the upstream cecum without evidence of enteric obstruction. Normal appearance the terminal ileum. The appendix is not visualized compatible provided operative history. No pneumoperitoneum, pneumatosis or portal venous gas. Moderate sized hiatal hernia. Lymphatic: No bulky retroperitoneal, mesenteric, pelvic or inguinal lymphadenopathy. Reproductive: Post hysterectomy. No discrete adnexal lesion. No free fluid the pelvic cul-de-sac. Other: Diffuse body wall anasarca. Tiny mesenteric fat containing periumbilical hernia. There is diastases of the midline rectus abdominal musculature. Musculoskeletal: No acute or aggressive osseous abnormalities. Severe scoliotic curvature of the thoracolumbar spine with dominant component convex the right measuring approximately 52 degrees (as measured from the inferior endplate of L3 to the superior endplate of Z85) with associated multilevel moderate to severe DDD. IMPRESSION: VASCULAR 1. Moderate amount of atherosclerotic plaque within a tortuous but normal caliber abdominal aorta, not resulting in a hemodynamically significant stenosis. Aortic Atherosclerosis (ICD10-I70.0). 2. Potential hemodynamically significant stenoses involving  the origin of the bilateral renal arteries without asymmetric renal atrophy or delayed renal perfusion. 3. Potential hemodynamically significant stenosis involving the proximal right SFA, incompletely imaged. Correlation for right lower extremity PAD symptoms is recommended. NON-VASCULAR 1. Interval development of short-segment circumferential wall thickening involving the ascending colon without evidence of enteric  obstruction. This represents an interval change compared to the 10/28 examination and thus is favored to either short-segment of enteritis, inflammation and/or localized ischemia. No evidence of pneumatosis, perforation or definable/drainable fluid collection. Note, the tributaries of the SMA supplying the segment of the colon are without definitive intraluminal filling defect to suggest distal embolism. 2. Colonic diverticulosis without evidence of diverticulitis. 3. Moderate-sized hiatal hernia. 4. Severe scoliotic curvature of the thoracolumbar spine with associated multilevel moderate to severe DDD. Electronically Signed   By: Sandi Mariscal M.D.   On: 03/04/2017 17:43     Patient very pleasant this am.  Anxious to go home.  Denies any pain.   Discharge Exam: Vitals:   03/04/17 2102 03/05/17 0700  BP:  (!) 162/83  Pulse:  84  Resp:  20  Temp:  98.1 F (36.7 C)  SpO2: 96% 96%   Vitals:   03/04/17 1258 03/04/17 2102 03/05/17 0500 03/05/17 0700  BP:    (!) 162/83  Pulse:    84  Resp:    20  Temp:    98.1 F (36.7 C)  TempSrc:      SpO2: 93% 96%  96%  Weight:   68 kg (149 lb 14.4 oz)   Height:        General: Pt is alert, awake, not in acute distress Cardiovascular: IRRR, S1/S2 +, no rubs, no gallops Respiratory: CTA bilaterally, no wheezing, no rhonchi Abdominal: Soft, NT, ND, bowel sounds + Extremities: no edema, no cyanosis   The results of significant diagnostics from this hospitalization (including imaging, microbiology, ancillary and laboratory) are listed below  for reference.    Significant Diagnostic Studies: Ct Abdomen Pelvis Wo Contrast  Result Date: 03/01/2017 CLINICAL DATA:  Oral only Abdominal pain with rectal bleeding Hx of htn,CKD III, hysterectomy,appendectomy/bbj EXAM: CT ABDOMEN AND PELVIS WITHOUT CONTRAST TECHNIQUE: Multidetector CT imaging of the abdomen and pelvis was performed following the standard protocol without IV contrast. COMPARISON:  06/24/2016 FINDINGS: Lower chest: Coronary and aortic calcifications. Transvenous pacing leads partially visualized. No pleural or pericardial effusion. Small hiatal hernia. Visualized lung bases clear. Hepatobiliary: Stable cysts in segments 3, 4B, and 6. No new liver abnormality is seen. Status post cholecystectomy. No biliary dilatation. Pancreas: Unremarkable. No pancreatic ductal dilatation or surrounding inflammatory changes. Spleen: Normal in size without focal abnormality. Adrenals/Urinary Tract: Small calcifications in the right renal collecting system, largest stone or cluster approximately 0.4 cm. No hydronephrosis. No ureterectasis. Urinary bladder is incompletely distended. Stomach/Bowel: Hiatal hernia. Stomach is nondilated. Small bowel is nondilated. Appendix reportedly surgically absent. The colon is nondistended. Multiple diverticula from distal descending and sigmoid portions of the colon, without adjacent inflammatory/edematous change or abscess. Vascular/Lymphatic: Heavy calcified plaque through the visualized distal descending thoracic and abdominal aorta without aneurysm. Extensive visceral and renal arterial calcified plaque. Patchy bilateral iliofemoral arterial plaque. No abdominal or pelvic adenopathy localized. Reproductive: Status post hysterectomy. No adnexal masses. Other: No ascites.  No free air. Musculoskeletal: Moderately severe thoracolumbar dextroscoliosis apex L1-2 with multilevel spondylitic changes throughout the lumbar spine. Chronic T12 compression deformity. No acute  fracture or worrisome bone lesion. IMPRESSION: 1. No acute findings. 2. Right nephrolithiasis without hydronephrosis. 3. Hiatal hernia 4. Descending and sigmoid diverticulosis. 5. Extensive aortoiliac, coronary, visceral and renal artery calcified atheromatous plaque. 6. Lumbar scoliosis, multilevel spondylitic change, and chronic T12 compression deformity. Electronically Signed   By: Lucrezia Europe M.D.   On: 03/01/2017 14:28   Dg Chest 2 View  Result Date: 03/01/2017 CLINICAL DATA:  atrial fibrillation, diverticulosis, chronic diastolic CHF, essential hypertension, fibromyalgia, CKD stage III, complete heart block status post permanent pacemaker presented with one-week history of intermittent rectal bleeding that had worsened over the past 2-3 daysHISTORY OF CHF, HTN EXAM: CHEST - 2 VIEW COMPARISON:  06/24/2016 FINDINGS: Lungs hyperinflated, clear. Heart size upper limits normal. Tortuous atheromatous thoracic aorta. Stable left subclavian AICD. No effusion.  No pneumothorax. Diffuse osteopenia. Compression deformity near the thoracolumbar junction, present since 06/24/2016. IMPRESSION: 1. Stable borderline cardiomegaly.  No acute disease. 2.  Aortic Atherosclerosis (ICD10-170.0) Electronically Signed   By: Lucrezia Europe M.D.   On: 03/01/2017 14:54   US Venous Img Lower Bilateral  Result Date: 03/01/2017 CLINICAL DATA:  Pain at rest x months. EXAM: BILATERAL LOWER EXTREMITY VENOUS DOPPLER ULTRASOUND TECHNIQUE: Gray-scale sonography with compression, as well as color and duplex ultrasound, were performed to evaluate the deep venous system from the level of the common femoral vein through the popliteal and proximal calf veins. COMPARISON:  None FINDINGS: Normal compressibility of the common femoral, superficial femoral, and popliteal veins, as well as the proximal calf veins. No filling defects to suggest DVT on grayscale or color Doppler imaging. Doppler waveforms show normal direction of venous flow, normal  respiratory phasicity and response to augmentation. Visualized segments of the saphenous venous systems normal in caliber and compressibility. IMPRESSION: No evidence of  lower extremity deep vein thrombosis, bilaterally. Electronically Signed   By: Lucrezia Europe M.D.   On: 03/01/2017 14:03   Ct Angio Abd/pel W/ And/or W/o  Result Date: 03/04/2017 CLINICAL DATA:  Rectal bleeding, worse during the past 2-3 days. History of chronic diastolic CHF, hypertension, stage 3 kidney disease in a fibrillation peer EXAM: CTA ABDOMEN AND PELVIS WITH CONTRAST TECHNIQUE: Multidetector CT imaging of the abdomen and pelvis was performed using the standard protocol during bolus administration of intravenous contrast. Multiplanar reconstructed images and MIPs were obtained and reviewed to evaluate the vascular anatomy. CONTRAST:  100 cc Isovue 370 COMPARISON:  03/01/2017; 06/24/2016;  11/01/2013 FINDINGS: VASCULAR Aorta: Moderate amount of mixed calcified and noncalcified atherosclerotic plaque within a tortuous but normal caliber abdominal aorta, not resulting in a hemodynamically significant stenosis. No evidence of abdominal aortic dissection or periaortic stranding. Celiac: Mixed calcified and noncalcified atherosclerotic plaque involving the origin the celiac artery approaches 50% luminal narrowing. SMA: There is a moderate amount of mixed calcified and noncalcified atherosclerotic plaque involving the main trunk of the SMA not definitely resulting in hemodynamically significant stenosis. Incidentally noted replaced right hepatic artery arises from the proximal SMA. The distal tributaries of the SMA appear patent without discrete intraluminal filling defect suggest distal embolism with special attention paid to the branch vessels supplying the area of wall thickening involving the sigmoid colon. Renals: Solitary bilaterally. There is a moderate amount of mixed calcified and noncalcified atherosclerotic plaque involving the  origin of the bilateral renal artery's which approaches approximately 50% luminal narrowing bilaterally. No vessel irregularity to suggest FMD. IMA: Diseased at its origin and proximally though remains patent. Inflow: There is a moderate amount of mixed calcified and noncalcified atherosclerotic plaque within the bilateral normal caliber common and external iliac arteries, not resulting in a hemodynamically significant stenosis. The bilateral internal iliac artery's are disease though patent and of normal caliber. Proximal Outflow: The bilateral common femoral artery is appear widely patent. Potential hemodynamically significant stenosis involving the proximal right SFA, incompletely imaged (image 227, series 6). Veins: The pelvic venous system and IVC appear widely patent. Review of the  MIP images confirms the above findings. NON-VASCULAR Lower chest: Limited visualization of the lower thorax is negative for focal airspace opacity or pleural effusion. Cardiomegaly, in particular, there is enlargement of the right atrium. Pacemaker lead tips terminate within the right atrium, ventricle and coronary sinus. Coronary artery calcifications. No pericardial effusion. Hepatobiliary: Normal hepatic contour. Re- demonstrated multiple hypoattenuating hepatic cysts with dominant cysts within the caudal aspect of the medial segment of the left lower liver measuring approximately 1.7 cm in diameter. No discrete worrisome hepatic lesions. Post cholecystectomy. No definite intra extrahepatic bili duct dilatation. No ascites. Pancreas: Normal appearance of the pancreas. Spleen: Normal appearance of the spleen. Adrenals/Urinary Tract: There is symmetric enhancement of the bilateral kidneys. No definite renal stones this postcontrast examination. No discrete renal lesions. No urine obstruction or perinephric stranding. Normal appearance of the bilateral adrenal glands. Normal appearance of the urinary bladder given degree of  distention. Stomach/Bowel: Rather extensive colonic diverticulosis without evidence of diverticulitis. Apparent circumferential wall thickening involving the mid ascending colon extending for a distance of approximately 6.3 cm (coronal image 33, series 11). Mild distension of the upstream cecum without evidence of enteric obstruction. Normal appearance the terminal ileum. The appendix is not visualized compatible provided operative history. No pneumoperitoneum, pneumatosis or portal venous gas. Moderate sized hiatal hernia. Lymphatic: No bulky retroperitoneal, mesenteric, pelvic or inguinal lymphadenopathy. Reproductive: Post hysterectomy. No discrete adnexal lesion. No free fluid the pelvic cul-de-sac. Other: Diffuse body wall anasarca. Tiny mesenteric fat containing periumbilical hernia. There is diastases of the midline rectus abdominal musculature. Musculoskeletal: No acute or aggressive osseous abnormalities. Severe scoliotic curvature of the thoracolumbar spine with dominant component convex the right measuring approximately 52 degrees (as measured from the inferior endplate of L3 to the superior endplate of W97) with associated multilevel moderate to severe DDD. IMPRESSION: VASCULAR 1. Moderate amount of atherosclerotic plaque within a tortuous but normal caliber abdominal aorta, not resulting in a hemodynamically significant stenosis. Aortic Atherosclerosis (ICD10-I70.0). 2. Potential hemodynamically significant stenoses involving the origin of the bilateral renal arteries without asymmetric renal atrophy or delayed renal perfusion. 3. Potential hemodynamically significant stenosis involving the proximal right SFA, incompletely imaged. Correlation for right lower extremity PAD symptoms is recommended. NON-VASCULAR 1. Interval development of short-segment circumferential wall thickening involving the ascending colon without evidence of enteric obstruction. This represents an interval change compared to the  10/28 examination and thus is favored to either short-segment of enteritis, inflammation and/or localized ischemia. No evidence of pneumatosis, perforation or definable/drainable fluid collection. Note, the tributaries of the SMA supplying the segment of the colon are without definitive intraluminal filling defect to suggest distal embolism. 2. Colonic diverticulosis without evidence of diverticulitis. 3. Moderate-sized hiatal hernia. 4. Severe scoliotic curvature of the thoracolumbar spine with associated multilevel moderate to severe DDD. Electronically Signed   By: Sandi Mariscal M.D.   On: 03/04/2017 17:43     Microbiology: No results found for this or any previous visit (from the past 240 hour(s)).   Labs: Basic Metabolic Panel:  Recent Labs Lab 02/26/17 1509 02/28/17 1938 03/01/17 0608 03/02/17 0444 03/03/17 0444  NA 141 138 141 136 140  K 4.2 3.7 4.0 4.3 4.1  CL 104 104 107 105 111  CO2 23 23 23 24 22   GLUCOSE 89 98 88 84 87  BUN 20 21* 18 13 17   CREATININE 1.38* 1.30* 1.15* 1.17* 1.27*  CALCIUM 9.3 9.1 8.9 8.8* 8.6*  MG  --   --   --  1.8  --  Liver Function Tests:  Recent Labs Lab 02/26/17 1509 02/28/17 1938  AST 20 24  ALT 10 14  ALKPHOS 57 51  BILITOT 0.5 0.6  PROT 6.7 6.8  ALBUMIN 4.3 4.0    Recent Labs Lab 02/28/17 1938  LIPASE 37   No results for input(s): AMMONIA in the last 168 hours. CBC:  Recent Labs Lab 02/26/17 1509 02/28/17 1938  03/01/17 2707 03/01/17 1331 03/02/17 0444 03/03/17 0444 03/04/17 0552  WBC 6.8 4.5  --  5.0  --  4.7 5.8 10.3  NEUTROABS 4.8 2.9  --   --   --   --   --   --   HGB 13.3 13.5  < > 12.4 12.5 12.1 12.1 12.3  HCT 39.2 39.2  --  37.1  --  36.5 36.8 36.4  MCV 102* 101.0*  --  101.4*  --  103.1* 103.1* 101.4*  PLT 186 153  --  157  --  155 151 140*  < > = values in this interval not displayed. Cardiac Enzymes:  Recent Labs Lab 03/02/17 1322 03/02/17 1816  TROPONINI <0.03 <0.03   BNP: Invalid input(s):  POCBNP CBG: No results for input(s): GLUCAP in the last 168 hours.  Time coordinating discharge:  35 minutes  Signed:  Loretha Stapler, MD [facility] Pager: [phone] 03/05/2017, 12:39 PM

## 2017-03-05 NOTE — Progress Notes (Signed)
Physical Therapy Treatment Patient Details Name: Kimberly [name] MRN: [id number] DOB: 09-14-1926 Today's Date: 03/05/2017    History of Present Illness Kimberly [name] is a 83 y.o. female with medical history significant of history of anemia, diverticulitis, antral ulcer, history of combined CHF, chronic atrial fibrillation on Eliquis, pacemaker placement, chronic kidney disease, depression, osteoarthritis, scoliosis who is coming to the emergency department with complaints of worsening chronic rectal bleeding for the past 2 days associated with dizziness, headache, decreased appetite and abdominal pain. She mentions that her constipation has been worse over the past few days and probably has caused increased bleeding. No liver disease history, chest pain, palpitations, diaphoresis, PND, orthopnea or pitting edema lower extremities. No nausea, emesis, diarrhea or melena. Complains of frequency due to furosemide use, but denies dysuria, hematuria or oliguria.83 year old female with a history of permanent atrial fibrillation, diverticulosis, chronic diastolic CHF, essential hypertension, fibromyalgia, CKD stage III, complete heart block status post permanent pacemaker presented with one-week history of intermittent rectal bleeding that had worsened over the past 2-3 days.  The patient states that she normally struggles with constipation, and she states that in the past week she has had to strain more to have bowel movement.  Associated with this, the patient has had some worsening of her rectal bleeding and abdominal pain.  She also has a sensation of incomplete evacuation.  She stated that her abdominal pain has been intermittent this past week, but has become more constant in the past 2-3 days prior to admission.  She denies any fevers, chills, chest pain, nausea, vomiting, diarrhea.  However she endorses some dyspnea on exertion which she states has been the same as usual.  Upon presentation, the patient was  afebrile hemodynamically stable with hemoglobin of 13.5.  Her last apixaban dose was on the morning of February 28, 2017.    PT Comments    Patient demonstrates good return for completing exercises with verbal cues, able to ambulate in hallway, but limited for gait secondary to c/o fatigue.  Patient will benefit from continued physical therapy in hospital and recommended venue below to increase strength, balance, endurance for safe ADLs and gait.    Follow Up Recommendations  Home health PT     Equipment Recommendations  None recommended by PT    Recommendations for Other Services       Precautions / Restrictions Precautions Precautions: Fall Restrictions Weight Bearing Restrictions: No    Mobility  Bed Mobility Overal bed mobility: Modified Independent                Transfers Overall transfer level: Needs assistance Equipment used: Rolling walker (2 wheeled) Transfers: Sit to/from Stand Sit to Stand: Supervision            Ambulation/Gait Ambulation/Gait assistance: Supervision Ambulation Distance (Feet): 35 Feet Assistive device: Rolling walker (2 wheeled) Gait Pattern/deviations: Decreased step length - right;Decreased step length - left;Decreased stride length   Gait velocity interpretation: Below normal speed for age/gender General Gait Details: slightly labored slower than normal cadence without loss of balance, limited secondary to c/o fatigue/mild SOB   Stairs            Wheelchair Mobility    Modified Rankin (Stroke Patients Only)       Balance Overall balance assessment: Needs assistance Sitting-balance support: Feet supported;No upper extremity supported Sitting balance-Leahy Scale: Good     Standing balance support: Bilateral upper extremity supported;During functional activity Standing balance-Leahy Scale: Fair  Cognition Arousal/Alertness: Awake/alert Behavior During Therapy: WFL for  tasks assessed/performed Overall Cognitive Status: Within Functional Limits for tasks assessed                                        Exercises General Exercises - Lower Extremity Ankle Circles/Pumps: AROM;Strengthening;Both;10 reps;Seated Long Arc Quad: Seated;AROM;Both;10 reps;Strengthening Hip ABduction/ADduction: Seated;AROM;Both;10 reps (pillow squeezes x 10, 3 second holds) Hip Flexion/Marching: Seated;Strengthening;AROM;Both Heel Raises: Standing;AROM;Strengthening;Both;10 reps (used RW, 3 second holds x 10 reps) Mini-Sqauts: Standing;AROM;Strengthening;Both;10 reps (used RW) Shoulder Exercises Shoulder Flexion: Seated;AROM;Both;10 reps;Strengthening    General Comments        Pertinent Vitals/Pain Pain Assessment: Faces Faces Pain Scale: No hurt    Home Living                      Prior Function            PT Goals (current goals can now be found in the care plan section) Acute Rehab PT Goals Patient Stated Goal: get stronger and go back home PT Goal Formulation: With patient Time For Goal Achievement: 03/09/17 Potential to Achieve Goals: Good Progress towards PT goals: Progressing toward goals    Frequency    Min 3X/week      PT Plan Current plan remains appropriate    Co-evaluation              AM-PAC PT "6 Clicks" Daily Activity  Outcome Measure  Difficulty turning over in bed (including adjusting bedclothes, sheets and blankets)?: None Difficulty moving from lying on back to sitting on the side of the bed? : None Difficulty sitting down on and standing up from a chair with arms (e.g., wheelchair, bedside commode, etc,.)?: A Little Help needed moving to and from a bed to chair (including a wheelchair)?: A Little Help needed walking in hospital room?: A Little Help needed climbing 3-5 steps with a railing? : A Little 6 Click Score: 20    End of Session   Activity Tolerance: Patient tolerated treatment well;Patient  limited by fatigue Patient left: in bed;with call bell/phone within reach;with nursing/sitter in room Nurse Communication: Mobility status PT Visit Diagnosis: Unsteadiness on feet (R26.81);Other abnormalities of gait and mobility (R26.89);Muscle weakness (generalized) (M62.81)     Time: 9147-8295 PT Time Calculation (min) (ACUTE ONLY): 28 min  Charges:  $Therapeutic Exercise: 8-22 mins $Therapeutic Activity: 8-22 mins                    G Codes:  Functional Assessment Tool Used: AM-PAC 6 Clicks Basic Mobility Functional Limitation: Mobility: Walking and moving around Mobility: Walking and Moving Around Current Status (A2130): At least 20 percent but less than 40 percent impaired, limited or restricted Mobility: Walking and Moving Around Goal Status [phone]): At least 20 percent but less than 40 percent impaired, limited or restricted Mobility: Walking and Moving Around Discharge Status [phone]): At least 20 percent but less than 40 percent impaired, limited or restricted    1:24 PM, 03/05/17 Lonell Grandchild, MPT Physical Therapist with [facility] 336 [phone] office [phone] mobile phone

## 2017-03-05 NOTE — Clinical Social Work Note (Signed)
Late entry for 03/04/17: Per attending's request, IVC paperwork completed and faxed to magistrate.    Shala Baumbach, Clydene Pugh, [name]

## 2017-03-05 NOTE — Progress Notes (Signed)
Patient very pleasant, smiling, stating that her and the NT in her room were laughing about a show on TV. Patient given water per her request. No complaints about the cup not being clean. Patient complimented this RN for being strong after taking the paper off the straw. Patient has not slept at all per NT. Will continue to monitor.

## 2017-03-05 NOTE — Clinical Social Work Note (Signed)
Patient cleared by psych and IVC recind paperwork faxed to magistrate.   Aspasia Rude, Clydene Pugh, [name]

## 2017-03-05 NOTE — Progress Notes (Signed)
Patient repeatedly calling to desk, asking for her clothes. Patient states that she is cold but does not want blanket or temperature in room adjusted. Patient given brand new tooth brush and toothpaste per her request. Patient asked for a plastic cup in packaging like from hotels. This RN explained that we do not have those, but offered a clean foam cp. Patient refused, stating that she did not know who had drank out of it. Patient requested eye drops, but refused eye drops that are ordered d/t the presense of alcohol in them. Patient upset about not having her meds. Patient would not allow this RN to explain why her meds are in pharmacy and not her room. Patient agitated and condescending but not combative. Will continue to monitor. Sitter in room with patient for supervision.

## 2017-03-05 NOTE — Progress Notes (Signed)
Subjective:  Patient with significant behavioral changes. Now has a Actuary for suicidal ideation. Nursing notes from overnight noted. Patient denies any abdominal pain this morning. Upon entering room she was throwing eggs. Cooperated with exam. But very agitated.   Objective: Vital signs in last 24 hours: Temp:  [98.1 F (36.7 C)] 98.1 F (36.7 C) (11/01 0700) Pulse Rate:  [84] 84 (11/01 0700) Resp:  [20] 20 (11/01 0700) BP: (162)/(83) 162/83 (11/01 0700) SpO2:  [93 %-96 %] 96 % (11/01 0700) Weight:  [149 lb 14.4 oz (68 kg)] 149 lb 14.4 oz (68 kg) (11/01 0500) Last BM Date: 03/03/17 General:   Alert,  Well-developed, well-nourished, agitated, but not combative. Cooperative in NAD Head:  Normocephalic and atraumatic. Eyes:  Sclera clear, no icterus.  Abdomen:  Soft, nontender and nondistended.  Normal bowel sounds, without guarding, and without rebound.   Extremities:  Without clubbing, deformity or edema. Neurologic:  Alert and  oriented to person. She thinks she was brought here too early for her doctor's appt.  Skin:  Intact without significant lesions or rashes. Psych:  Alert and agitated.    Intake/Output from previous day: 10/31 0701 - 11/01 0700 In: 360 [P.O.:360] Out: -  Intake/Output this shift: No intake/output data recorded.  Lab Results: CBC  Recent Labs  03/03/17 0444 03/04/17 0552  WBC 5.8 10.3  HGB 12.1 12.3  HCT 36.8 36.4  MCV 103.1* 101.4*  PLT 151 140*   BMET  Recent Labs  03/03/17 0444  NA 140  K 4.1  CL 111  CO2 22  GLUCOSE 87  BUN 17  CREATININE 1.27*  CALCIUM 8.6*   LFTs No results for input(s): BILITOT, BILIDIR, IBILI, ALKPHOS, AST, ALT, PROT, ALBUMIN in the last 72 hours. No results for input(s): LIPASE in the last 72 hours. PT/INR No results for input(s): LABPROT, INR in the last 72 hours.    Imaging Studies: Ct Abdomen Pelvis Wo Contrast  Result Date: 03/01/2017 CLINICAL DATA:  Oral only Abdominal pain with rectal bleeding  Hx of htn,CKD III, hysterectomy,appendectomy/bbj EXAM: CT ABDOMEN AND PELVIS WITHOUT CONTRAST TECHNIQUE: Multidetector CT imaging of the abdomen and pelvis was performed following the standard protocol without IV contrast. COMPARISON:  06/24/2016 FINDINGS: Lower chest: Coronary and aortic calcifications. Transvenous pacing leads partially visualized. No pleural or pericardial effusion. Small hiatal hernia. Visualized lung bases clear. Hepatobiliary: Stable cysts in segments 3, 4B, and 6. No new liver abnormality is seen. Status post cholecystectomy. No biliary dilatation. Pancreas: Unremarkable. No pancreatic ductal dilatation or surrounding inflammatory changes. Spleen: Normal in size without focal abnormality. Adrenals/Urinary Tract: Small calcifications in the right renal collecting system, largest stone or cluster approximately 0.4 cm. No hydronephrosis. No ureterectasis. Urinary bladder is incompletely distended. Stomach/Bowel: Hiatal hernia. Stomach is nondilated. Small bowel is nondilated. Appendix reportedly surgically absent. The colon is nondistended. Multiple diverticula from distal descending and sigmoid portions of the colon, without adjacent inflammatory/edematous change or abscess. Vascular/Lymphatic: Heavy calcified plaque through the visualized distal descending thoracic and abdominal aorta without aneurysm. Extensive visceral and renal arterial calcified plaque. Patchy bilateral iliofemoral arterial plaque. No abdominal or pelvic adenopathy localized. Reproductive: Status post hysterectomy. No adnexal masses. Other: No ascites.  No free air. Musculoskeletal: Moderately severe thoracolumbar dextroscoliosis apex L1-2 with multilevel spondylitic changes throughout the lumbar spine. Chronic T12 compression deformity. No acute fracture or worrisome bone lesion. IMPRESSION: 1. No acute findings. 2. Right nephrolithiasis without hydronephrosis. 3. Hiatal hernia 4. Descending and sigmoid diverticulosis. 5.  Extensive aortoiliac, coronary,  visceral and renal artery calcified atheromatous plaque. 6. Lumbar scoliosis, multilevel spondylitic change, and chronic T12 compression deformity. Electronically Signed   By: Lucrezia Europe M.D.   On: 03/01/2017 14:28   Dg Chest 2 View  Result Date: 03/01/2017 CLINICAL DATA:  atrial fibrillation, diverticulosis, chronic diastolic CHF, essential hypertension, fibromyalgia, CKD stage III, complete heart block status post permanent pacemaker presented with one-week history of intermittent rectal bleeding that had worsened over the past 2-3 daysHISTORY OF CHF, HTN EXAM: CHEST - 2 VIEW COMPARISON:  06/24/2016 FINDINGS: Lungs hyperinflated, clear. Heart size upper limits normal. Tortuous atheromatous thoracic aorta. Stable left subclavian AICD. No effusion.  No pneumothorax. Diffuse osteopenia. Compression deformity near the thoracolumbar junction, present since 06/24/2016. IMPRESSION: 1. Stable borderline cardiomegaly.  No acute disease. 2.  Aortic Atherosclerosis (ICD10-170.0) Electronically Signed   By: Lucrezia Europe M.D.   On: 03/01/2017 14:54   US Venous Img Lower Bilateral  Result Date: 03/01/2017 CLINICAL DATA:  Pain at rest x months. EXAM: BILATERAL LOWER EXTREMITY VENOUS DOPPLER ULTRASOUND TECHNIQUE: Gray-scale sonography with compression, as well as color and duplex ultrasound, were performed to evaluate the deep venous system from the level of the common femoral vein through the popliteal and proximal calf veins. COMPARISON:  None FINDINGS: Normal compressibility of the common femoral, superficial femoral, and popliteal veins, as well as the proximal calf veins. No filling defects to suggest DVT on grayscale or color Doppler imaging. Doppler waveforms show normal direction of venous flow, normal respiratory phasicity and response to augmentation. Visualized segments of the saphenous venous systems normal in caliber and compressibility. IMPRESSION: No evidence of  lower  extremity deep vein thrombosis, bilaterally. Electronically Signed   By: Lucrezia Europe M.D.   On: 03/01/2017 14:03   Ct Angio Abd/pel W/ And/or W/o  Result Date: 03/04/2017 CLINICAL DATA:  Rectal bleeding, worse during the past 2-3 days. History of chronic diastolic CHF, hypertension, stage 3 kidney disease in a fibrillation peer EXAM: CTA ABDOMEN AND PELVIS WITH CONTRAST TECHNIQUE: Multidetector CT imaging of the abdomen and pelvis was performed using the standard protocol during bolus administration of intravenous contrast. Multiplanar reconstructed images and MIPs were obtained and reviewed to evaluate the vascular anatomy. CONTRAST:  100 cc Isovue 370 COMPARISON:  03/01/2017; 06/24/2016;  11/01/2013 FINDINGS: VASCULAR Aorta: Moderate amount of mixed calcified and noncalcified atherosclerotic plaque within a tortuous but normal caliber abdominal aorta, not resulting in a hemodynamically significant stenosis. No evidence of abdominal aortic dissection or periaortic stranding. Celiac: Mixed calcified and noncalcified atherosclerotic plaque involving the origin the celiac artery approaches 50% luminal narrowing. SMA: There is a moderate amount of mixed calcified and noncalcified atherosclerotic plaque involving the main trunk of the SMA not definitely resulting in hemodynamically significant stenosis. Incidentally noted replaced right hepatic artery arises from the proximal SMA. The distal tributaries of the SMA appear patent without discrete intraluminal filling defect suggest distal embolism with special attention paid to the branch vessels supplying the area of wall thickening involving the sigmoid colon. Renals: Solitary bilaterally. There is a moderate amount of mixed calcified and noncalcified atherosclerotic plaque involving the origin of the bilateral renal artery's which approaches approximately 50% luminal narrowing bilaterally. No vessel irregularity to suggest FMD. IMA: Diseased at its origin and  proximally though remains patent. Inflow: There is a moderate amount of mixed calcified and noncalcified atherosclerotic plaque within the bilateral normal caliber common and external iliac arteries, not resulting in a hemodynamically significant stenosis. The bilateral internal iliac artery's are disease  though patent and of normal caliber. Proximal Outflow: The bilateral common femoral artery is appear widely patent. Potential hemodynamically significant stenosis involving the proximal right SFA, incompletely imaged (image 227, series 6). Veins: The pelvic venous system and IVC appear widely patent. Review of the MIP images confirms the above findings. NON-VASCULAR Lower chest: Limited visualization of the lower thorax is negative for focal airspace opacity or pleural effusion. Cardiomegaly, in particular, there is enlargement of the right atrium. Pacemaker lead tips terminate within the right atrium, ventricle and coronary sinus. Coronary artery calcifications. No pericardial effusion. Hepatobiliary: Normal hepatic contour. Re- demonstrated multiple hypoattenuating hepatic cysts with dominant cysts within the caudal aspect of the medial segment of the left lower liver measuring approximately 1.7 cm in diameter. No discrete worrisome hepatic lesions. Post cholecystectomy. No definite intra extrahepatic bili duct dilatation. No ascites. Pancreas: Normal appearance of the pancreas. Spleen: Normal appearance of the spleen. Adrenals/Urinary Tract: There is symmetric enhancement of the bilateral kidneys. No definite renal stones this postcontrast examination. No discrete renal lesions. No urine obstruction or perinephric stranding. Normal appearance of the bilateral adrenal glands. Normal appearance of the urinary bladder given degree of distention. Stomach/Bowel: Rather extensive colonic diverticulosis without evidence of diverticulitis. Apparent circumferential wall thickening involving the mid ascending colon  extending for a distance of approximately 6.3 cm (coronal image 33, series 11). Mild distension of the upstream cecum without evidence of enteric obstruction. Normal appearance the terminal ileum. The appendix is not visualized compatible provided operative history. No pneumoperitoneum, pneumatosis or portal venous gas. Moderate sized hiatal hernia. Lymphatic: No bulky retroperitoneal, mesenteric, pelvic or inguinal lymphadenopathy. Reproductive: Post hysterectomy. No discrete adnexal lesion. No free fluid the pelvic cul-de-sac. Other: Diffuse body wall anasarca. Tiny mesenteric fat containing periumbilical hernia. There is diastases of the midline rectus abdominal musculature. Musculoskeletal: No acute or aggressive osseous abnormalities. Severe scoliotic curvature of the thoracolumbar spine with dominant component convex the right measuring approximately 52 degrees (as measured from the inferior endplate of L3 to the superior endplate of K02) with associated multilevel moderate to severe DDD. IMPRESSION: VASCULAR 1. Moderate amount of atherosclerotic plaque within a tortuous but normal caliber abdominal aorta, not resulting in a hemodynamically significant stenosis. Aortic Atherosclerosis (ICD10-I70.0). 2. Potential hemodynamically significant stenoses involving the origin of the bilateral renal arteries without asymmetric renal atrophy or delayed renal perfusion. 3. Potential hemodynamically significant stenosis involving the proximal right SFA, incompletely imaged. Correlation for right lower extremity PAD symptoms is recommended. NON-VASCULAR 1. Interval development of short-segment circumferential wall thickening involving the ascending colon without evidence of enteric obstruction. This represents an interval change compared to the 10/28 examination and thus is favored to either short-segment of enteritis, inflammation and/or localized ischemia. No evidence of pneumatosis, perforation or definable/drainable  fluid collection. Note, the tributaries of the SMA supplying the segment of the colon are without definitive intraluminal filling defect to suggest distal embolism. 2. Colonic diverticulosis without evidence of diverticulitis. 3. Moderate-sized hiatal hernia. 4. Severe scoliotic curvature of the thoracolumbar spine with associated multilevel moderate to severe DDD. Electronically Signed   By: Sandi Mariscal M.D.   On: 03/04/2017 17:43  [2 weeks]   Assessment: 83 y/o female admitted with low volume hematochezia. Modest drop in Hgb, but still within normal range. Chronic constipation and several weeks of lower abd discomfort. Has had CT with IV/oral contrast back in February and oral contrast only CT this admission without explanation for her abdominal pain.  EGD yesterday with diffuse moderate inflammation and edema  noted in the stomach and to a lesser degree in the duodenal bulb.  Medium sized hiatal hernia.  On colonoscopy she had normal terminal ileum, 5 sessile polyps in the rectum, sigmoid colon, proximal transverse colon, ascending colon removed.  Multiple small and large mouth diverticula found in the rectosigmoid colon and sigmoid colon.  Internal hemorrhoids.  Unable to clear the cecum due to retained particular and matter that could not be aspirated.  It is noted that patient had several complaints of abdominal pain throughout the hospital stay. Complains of abdominal pain unrelated to meals, worse with movement going on for at least several weeks if not months given the fact that she has had a couple of CT scans. Hurts with palpation which goes against referred abdominal pain from her back.  Symptoms are not typical of mesenteric ischemia. STATES SHE HAS NO PAIN IN ABDOMEN TODAY AND ABDOMINAL EXAM IS BENIGN.  CTA abd/pelvis yesterday showed celiac 50% luminal narrowing. SMA without definite hemodynamically significant stenosis, patient IMA. Apparent circumferential wall thickening involving mid  ascending colon 6.3cm in length with no obstruction. No evidence of intraluminal filling defect of tributaries of the SMA supplying this segment of colon to suggest distal embolism. ASCENDING COLON WALL THICKENING LIKELY DUE TO RECENTLY REMOVED COLON POLYPS.  She has bilateral potential hemodynamically significant stenoses involving origin o the bilateral renal arteries without asymmetric renal atrophy or delayed renal perfusion.  Behavioral changes, management as per attending recommendations.  Plan: 1. Management of renal artery stenosis per attending.  2. F/u pending path as available.  3. Chronic pantoprazole 40mg  daily. 4. Will follow peripherally.   Laureen Ochs. Bernarda Caffey [facility] [phone] 11/1/20189:22 AM     LOS: 0 days

## 2017-03-06 ENCOUNTER — Telehealth: Payer: Self-pay | Admitting: Cardiology

## 2017-03-06 NOTE — Telephone Encounter (Signed)
Reviewed remote transmission with patient and explained that there were no episodes recorded. She verbalized understanding. She has a BP cuff but is unsure of how to use it and is not sure of her baseline BP. She was hospitalized last week with a GI bleed and currently has no scheduled follow up. I recommended she reach out to her PCP for a follow up appointment. She was agreeable to this plan.

## 2017-03-06 NOTE — Telephone Encounter (Signed)
Patient called and stated that she sent a manual transmission b/c she was not feeling well yesterday. She felt heart palpations, and feeling dizzy. Call forwarded to [place].

## 2017-03-09 ENCOUNTER — Telehealth: Payer: Self-pay | Admitting: Family Medicine

## 2017-03-09 DIAGNOSIS — Z7982 Long term (current) use of aspirin: Secondary | ICD-10-CM | POA: Diagnosis not present

## 2017-03-09 DIAGNOSIS — R109 Unspecified abdominal pain: Secondary | ICD-10-CM | POA: Diagnosis not present

## 2017-03-09 DIAGNOSIS — K6389 Other specified diseases of intestine: Secondary | ICD-10-CM | POA: Diagnosis not present

## 2017-03-09 DIAGNOSIS — R10817 Generalized abdominal tenderness: Secondary | ICD-10-CM | POA: Diagnosis not present

## 2017-03-09 DIAGNOSIS — K859 Acute pancreatitis without necrosis or infection, unspecified: Secondary | ICD-10-CM | POA: Diagnosis not present

## 2017-03-09 DIAGNOSIS — R1111 Vomiting without nausea: Secondary | ICD-10-CM | POA: Diagnosis not present

## 2017-03-09 DIAGNOSIS — I519 Heart disease, unspecified: Secondary | ICD-10-CM | POA: Diagnosis not present

## 2017-03-09 DIAGNOSIS — Z950 Presence of cardiac pacemaker: Secondary | ICD-10-CM | POA: Diagnosis not present

## 2017-03-09 DIAGNOSIS — I509 Heart failure, unspecified: Secondary | ICD-10-CM | POA: Diagnosis not present

## 2017-03-09 DIAGNOSIS — I10 Essential (primary) hypertension: Secondary | ICD-10-CM | POA: Diagnosis not present

## 2017-03-09 DIAGNOSIS — K2970 Gastritis, unspecified, without bleeding: Secondary | ICD-10-CM | POA: Diagnosis not present

## 2017-03-09 DIAGNOSIS — Z79899 Other long term (current) drug therapy: Secondary | ICD-10-CM | POA: Diagnosis not present

## 2017-03-11 DIAGNOSIS — N183 Chronic kidney disease, stage 3 (moderate): Secondary | ICD-10-CM | POA: Diagnosis not present

## 2017-03-11 DIAGNOSIS — I5032 Chronic diastolic (congestive) heart failure: Secondary | ICD-10-CM | POA: Diagnosis not present

## 2017-03-11 DIAGNOSIS — M5137 Other intervertebral disc degeneration, lumbosacral region: Secondary | ICD-10-CM | POA: Diagnosis not present

## 2017-03-11 DIAGNOSIS — M797 Fibromyalgia: Secondary | ICD-10-CM | POA: Diagnosis not present

## 2017-03-11 DIAGNOSIS — I130 Hypertensive heart and chronic kidney disease with heart failure and stage 1 through stage 4 chronic kidney disease, or unspecified chronic kidney disease: Secondary | ICD-10-CM | POA: Diagnosis not present

## 2017-03-12 ENCOUNTER — Encounter: Payer: Self-pay | Admitting: Family Medicine

## 2017-03-12 ENCOUNTER — Ambulatory Visit (INDEPENDENT_AMBULATORY_CARE_PROVIDER_SITE_OTHER): Payer: Medicare Other | Admitting: Family Medicine

## 2017-03-12 VITALS — BP 133/82 | HR 73 | Temp 97.4°F | Ht 65.0 in | Wt 146.0 lb

## 2017-03-12 DIAGNOSIS — K921 Melena: Secondary | ICD-10-CM | POA: Diagnosis not present

## 2017-03-12 DIAGNOSIS — K279 Peptic ulcer, site unspecified, unspecified as acute or chronic, without hemorrhage or perforation: Secondary | ICD-10-CM | POA: Diagnosis not present

## 2017-03-12 DIAGNOSIS — G4700 Insomnia, unspecified: Secondary | ICD-10-CM | POA: Diagnosis not present

## 2017-03-12 DIAGNOSIS — R1030 Lower abdominal pain, unspecified: Secondary | ICD-10-CM

## 2017-03-12 MED ORDER — METRONIDAZOLE 500 MG PO TABS: 500.0000 mg | ORAL_TABLET | Freq: Two times a day (BID) | ORAL | 0 refills | Status: DC

## 2017-03-12 MED ORDER — CIPROFLOXACIN HCL 250 MG PO TABS: 250.0000 mg | ORAL_TABLET | Freq: Two times a day (BID) | ORAL | 0 refills | Status: DC

## 2017-03-12 MED ORDER — OMEPRAZOLE 20 MG PO CPDR: 20.0000 mg | DELAYED_RELEASE_CAPSULE | Freq: Two times a day (BID) | ORAL | 3 refills | Status: DC

## 2017-03-12 NOTE — Telephone Encounter (Signed)
On providers desk to sign

## 2017-03-12 NOTE — Progress Notes (Signed)
BP 133/82   Pulse 73   Temp (!) 97.4 F (36.3 C) (Oral)   Ht _0  (1.651 m)   Wt 146 lb (66.2 kg)   BMI 24.30 kg/m    Subjective:    Patient ID: Kimberly [name], female    DOB: [birth year], 83 y.o.   MRN: [id number]  HPI: Kimberly [name] is a 83 y.o. female presenting on 03/12/2017 for Hospitalization Follow-up (still having pain in RLQ)   HPI Abdominal pain Patient comes in today complaining of abdominal pain and blood streaks in her stools.  She has been having this off and on for some time but most recently it has been going on for the past 9 months.  She gets it intermittently and then she will have some blood streaks in her stool.  She has been evaluated in the emergency department on a couple occasions and then here in our office on a few different occasions for this as well.  The pain is in her right lower quadrant and left lower quadrant and epigastric region but mostly in the right lower quadrant.  Patient admits to being constipated at times and then at times having looser bowel movements.  Patient does state that she has been taking omeprazole sometimes for her stomach ulcer history but not as consistently as she should.  She needs a refill for the omeprazole.  The blood in her stool is bright red and mostly in streaks and she has been having to strain some since she thinks she does have hemorrhoids possibly.  Difficulty sleeping Patient comes in today also complaining of difficulty sleeping.  She says she used to take temazepam and it worked and would like to consider it again but is open to the possibility of other options.  She does admit that she is been having a lot of falls recently and we do have a phone note advising [place] of her alcohol intake and that it is excessive.  She says mostly she has difficulty with sleep onset but when she is asleep she can stay asleep.  Relevant past medical, surgical, family and social history reviewed and updated as indicated. Interim medical  history since our last visit reviewed. Allergies and medications reviewed and updated.  Review of Systems  Constitutional: Negative for chills and fever.  Eyes: Negative for visual disturbance.  Respiratory: Negative for chest tightness and shortness of breath.   Cardiovascular: Negative for chest pain and leg swelling.  Gastrointestinal: Positive for abdominal pain, anal bleeding, constipation and nausea. Negative for abdominal distention, diarrhea and vomiting.  Genitourinary: Negative for difficulty urinating, dysuria, flank pain, vaginal bleeding, vaginal discharge and vaginal pain.  Musculoskeletal: Negative for back pain and gait problem.  Skin: Negative for rash.  Neurological: Negative for light-headedness and headaches.  Psychiatric/Behavioral: Positive for sleep disturbance. Negative for agitation, behavioral problems, self-injury and suicidal ideas.  All other systems reviewed and are negative.   Per HPI unless specifically indicated above        Objective:    BP 133/82   Pulse 73   Temp (!) 97.4 F (36.3 C) (Oral)   Ht _1  (1.651 m)   Wt 146 lb (66.2 kg)   BMI 24.30 kg/m   Wt Readings from Last 3 Encounters:  03/12/17 146 lb (66.2 kg)  03/05/17 149 lb 14.4 oz (68 kg)  02/26/17 149 lb (67.6 kg)    Physical Exam  Constitutional: She is oriented to person, place, and time. She appears well-developed  and well-nourished. No distress.  Eyes: Conjunctivae are normal.  Neck: Neck supple. No thyromegaly present.  Cardiovascular: Normal rate, regular rhythm, normal heart sounds and intact distal pulses.  No murmur heard. Pulmonary/Chest: Effort normal and breath sounds normal. No respiratory distress. She has no wheezes. She has no rales.  Abdominal: Soft. Bowel sounds are normal. She exhibits no distension. There is no hepatosplenomegaly. There is tenderness in the right lower quadrant and left lower quadrant. There is no rigidity, no rebound, no guarding and no CVA  tenderness.  Genitourinary:  Genitourinary Comments: Patient declined exam  Musculoskeletal: Normal range of motion.  Lymphadenopathy:    She has no cervical adenopathy.  Neurological: She is alert and oriented to person, place, and time. Coordination normal.  Skin: Skin is warm and dry. No rash noted. She is not diaphoretic.  Psychiatric: She has a normal mood and affect. Her behavior is normal.  Nursing note and vitals reviewed.       Assessment & Plan:   Problem List Items Addressed This Visit      Digestive   PUD (peptic ulcer disease) - Primary   Relevant Medications   omeprazole (PRILOSEC) 20 MG capsule   Other Relevant Orders   CMP14+EGFR (Completed)     Other   Hematochezia   Relevant Medications   omeprazole (PRILOSEC) 20 MG capsule   Other Relevant Orders   CBC with Differential/Platelet (Completed)   CMP14+EGFR (Completed)   Lower abdominal pain   Relevant Medications   ciprofloxacin (CIPRO) 250 MG tablet   metroNIDAZOLE (FLAGYL) 500 MG tablet   Other Relevant Orders   CMP14+EGFR (Completed)    Other Visit Diagnoses    Insomnia, unspecified type           Follow up plan: Return in about 3 months (around 06/12/2017), or if symptoms worsen or fail to improve, for Hypertension follow-up.  Counseling provided for all of the vaccine components Orders Placed This Encounter  Procedures  . CBC with Differential/Platelet  . [place] Oliviagrace Crisanti, MD [facility] 03/12/2017, 3:57 PM

## 2017-03-13 ENCOUNTER — Telehealth: Payer: Self-pay | Admitting: Family Medicine

## 2017-03-13 ENCOUNTER — Telehealth: Payer: Self-pay | Admitting: Gastroenterology

## 2017-03-13 LAB — CBC WITH DIFFERENTIAL/PLATELET
BASOS: 0 %
Basophils Absolute: 0 10*3/uL (ref 0.0–0.2)
EOS (ABSOLUTE): 0.1 10*3/uL (ref 0.0–0.4)
EOS: 1 %
HEMATOCRIT: 35.4 % (ref 34.0–46.6)
HEMOGLOBIN: 11.9 g/dL (ref 11.1–15.9)
Immature Grans (Abs): 0 10*3/uL (ref 0.0–0.1)
Immature Granulocytes: 1 %
LYMPHS ABS: 1.4 10*3/uL (ref 0.7–3.1)
Lymphs: 22 %
MCH: 34.4 pg — AB (ref 26.6–33.0)
MCHC: 33.6 g/dL (ref 31.5–35.7)
MCV: 102 fL — AB (ref 79–97)
MONOCYTES: 8 %
MONOS ABS: 0.5 10*3/uL (ref 0.1–0.9)
NEUTROS ABS: 4.6 10*3/uL (ref 1.4–7.0)
Neutrophils: 68 %
Platelets: 218 10*3/uL (ref 150–379)
RBC: 3.46 x10E6/uL — AB (ref 3.77–5.28)
RDW: 14.1 % (ref 12.3–15.4)
WBC: 6.7 10*3/uL (ref 3.4–10.8)

## 2017-03-13 LAB — CMP14+EGFR
A/G RATIO: 1.5 (ref 1.2–2.2)
ALBUMIN: 4 g/dL (ref 3.5–4.7)
ALK PHOS: 50 IU/L (ref 39–117)
ALT: 11 IU/L (ref 0–32)
AST: 24 IU/L (ref 0–40)
BUN / CREAT RATIO: 13 (ref 12–28)
BUN: 18 mg/dL (ref 8–27)
Bilirubin Total: 0.5 mg/dL (ref 0.0–1.2)
CHLORIDE: 105 mmol/L (ref 96–106)
CO2: 21 mmol/L (ref 20–29)
Calcium: 8.8 mg/dL (ref 8.7–10.3)
Creatinine, Ser: 1.41 mg/dL — ABNORMAL HIGH (ref 0.57–1.00)
GFR, EST AFRICAN AMERICAN: 38 mL/min/{1.73_m2} — AB (ref >59)
GFR, EST NON AFRICAN AMERICAN: 33 mL/min/{1.73_m2} — AB (ref >59)
GLOBULIN, TOTAL: 2.6 g/dL (ref 1.5–4.5)
Glucose: 94 mg/dL (ref 65–99)
POTASSIUM: 4.2 mmol/L (ref 3.5–5.2)
Sodium: 141 mmol/L (ref 134–144)
Total Protein: 6.6 g/dL (ref 6.0–8.5)

## 2017-03-13 MED ORDER — TRAZODONE HCL 50 MG PO TABS: 25.0000 mg | ORAL_TABLET | Freq: Every evening | ORAL | 3 refills | Status: DC | PRN

## 2017-03-13 NOTE — Telephone Encounter (Signed)
pt aware    Pt now needs a DME order for bedside commode printed, signed and faxed to [place] [facility] In [place].  Her is broke - she is scared she will fall.

## 2017-03-13 NOTE — Telephone Encounter (Signed)
Please call pt. She had ONE SERRATED adenoma, THREE simple adenoma, AND ONE HYPERPLASTIC POLYPS removed . I WAS ONLY ABLE TO SEE 99% OF HER COLON. SHE SHOULD HAVE A COMPLETE COLONOSCOPY IN THE NEXT 1-2 MOS. SHE WILL NEED TO HOLD ELIQUIS 48 HRS PRIOR TO A COLONOSCOPY.

## 2017-03-13 NOTE — Telephone Encounter (Signed)
Pt called  - she is only getting about 2 hours of sleep a night  - she is VERY tired Used to take temazepam - wants it again  If sent  - note that it will be for delivery / [facility]

## 2017-03-16 ENCOUNTER — Telehealth: Payer: Self-pay | Admitting: Gastroenterology

## 2017-03-16 ENCOUNTER — Telehealth: Payer: Self-pay | Admitting: *Deleted

## 2017-03-16 NOTE — Telephone Encounter (Signed)
Pt now needs a DME order for walker small one with the little white 'ski' slides printed, signed and faxed to [employer] in [place]

## 2017-03-16 NOTE — Telephone Encounter (Signed)
SHE SHOULD HAVE A COMPLETE COLONOSCOPY IN THE NEXT 1-2 MOS. SHE WILL NEED TO HOLD ELIQUIS 48 HRS PRIOR TO A COLONOSCOPY.( PER SF NOTE IN Epic) SF said patient could be triaged.

## 2017-03-16 NOTE — Telephone Encounter (Signed)
VM from [place] w/ [place] Pt drinking alcohol, lots of bottles around house Could be contributing to falls

## 2017-03-16 NOTE — Telephone Encounter (Signed)
See separate note.

## 2017-03-16 NOTE — Telephone Encounter (Signed)
Triage for TCS.

## 2017-03-16 NOTE — Telephone Encounter (Signed)
Please review and advise.

## 2017-03-16 NOTE — Telephone Encounter (Signed)
Does she need to have OV or can she be triaged?

## 2017-03-17 ENCOUNTER — Telehealth: Payer: Self-pay | Admitting: Family Medicine

## 2017-03-17 DIAGNOSIS — R269 Unspecified abnormalities of gait and mobility: Secondary | ICD-10-CM | POA: Diagnosis not present

## 2017-03-17 DIAGNOSIS — N183 Chronic kidney disease, stage 3 (moderate): Secondary | ICD-10-CM | POA: Diagnosis not present

## 2017-03-17 DIAGNOSIS — M5137 Other intervertebral disc degeneration, lumbosacral region: Secondary | ICD-10-CM | POA: Diagnosis not present

## 2017-03-17 DIAGNOSIS — M797 Fibromyalgia: Secondary | ICD-10-CM | POA: Diagnosis not present

## 2017-03-17 DIAGNOSIS — I130 Hypertensive heart and chronic kidney disease with heart failure and stage 1 through stage 4 chronic kidney disease, or unspecified chronic kidney disease: Secondary | ICD-10-CM | POA: Diagnosis not present

## 2017-03-17 DIAGNOSIS — I5032 Chronic diastolic (congestive) heart failure: Secondary | ICD-10-CM | POA: Diagnosis not present

## 2017-03-17 DIAGNOSIS — M1990 Unspecified osteoarthritis, unspecified site: Secondary | ICD-10-CM | POA: Diagnosis not present

## 2017-03-17 NOTE — Telephone Encounter (Signed)
Patient aware.

## 2017-03-17 NOTE — Telephone Encounter (Signed)
Go ahead and do order for commode and the type of walker that she wants. Caryl Pina, MD [facility] 03/17/2017, 7:50 AM

## 2017-03-17 NOTE — Telephone Encounter (Signed)
What symptoms do you have? Diarrhea   How long have you been sick? Two days  Have you been seen for this problem? Yes for abdomen pain and was given rx thinks that is what is causing her diarrhea  If your provider decides to give you a prescription, which pharmacy would you like for it to be sent to? [facility]   Patient informed that this information will be sent to the clinical staff for review and that they should receive a follow up call.

## 2017-03-17 NOTE — Telephone Encounter (Signed)
Doris, please triage patient as stated by Dr. Oneida Alar below. Thanks

## 2017-03-17 NOTE — Telephone Encounter (Signed)
With her falling temazepam is not a good idea and with her alcohol intake temazepam is not a good idea.  Give trazodone a little more time and reduce alcohol intake

## 2017-03-17 NOTE — Telephone Encounter (Signed)
Patient is taking two Trazodone at bedtime and is still not sleeping.  Has tried relaxing before bed, cutting off all electronics and reading, but no success.  She has taken Temazepam 20 mg in past and this worked for her.  Uses [employer] [employer].  Please advise.

## 2017-03-17 NOTE — Telephone Encounter (Signed)
Patient concerned that Cipro is causing diarrhea.  Patient advised that this can be normal, she should take medication on a full stomach, may try immodium to see if that will help.  Call us back if any problems.

## 2017-03-17 NOTE — Telephone Encounter (Signed)
Pt notified of results, routing to [facility] clinical pool

## 2017-03-17 NOTE — Telephone Encounter (Signed)
Thank you for the information.

## 2017-03-18 ENCOUNTER — Telehealth: Payer: Self-pay | Admitting: Family Medicine

## 2017-03-18 NOTE — Telephone Encounter (Signed)
Rx written and placed on providers desk for signature to be faxed upon return

## 2017-03-19 ENCOUNTER — Telehealth: Payer: Self-pay

## 2017-03-19 ENCOUNTER — Telehealth: Payer: Self-pay | Admitting: Gastroenterology

## 2017-03-19 NOTE — Telephone Encounter (Deleted)
Gastroenterology Pre-Procedure Review  Request Date: 03/19/2017 Requesting Physician: Dr. Oneida Alar   PATIENT REVIEW QUESTIONS: The patient responded to the following health history questions as indicated:    1. Diabetes Melitis: no 2. Joint replacements in the past 12 months: no 3. Major health problems in the past 3 months: no 4. Has an artificial valve or MVP: no 5. Has a defibrillator: no   PT HAS A PACEMAKER 6. Has been advised in past to take antibiotics in advance of a procedure like teeth cleaning: no 7. Family history of colon cancer: no  8. Alcohol Use: yes   OCCASIONALLY 9. History of sleep apnea: no  10. History of coronary artery or other vascular stents placed within the last 12 months: no 11. History of any prior anesthesia complications: no    MEDICATIONS & ALLERGIES:    Patient reports the following regarding taking any blood thinners:   Plavix? no Aspirin? no Coumadin? no Brilinta? no Xarelto? no Eliquis? yes Pradaxa? no Savaysa? no Effient? no  Patient confirms/reports the following medications:  Current Outpatient Medications  Medication Sig Dispense Refill  . acetaminophen (TYLENOL) 500 MG tablet Take 500 mg by mouth every 6 (six) hours as needed.    . carvedilol (COREG) 6.25 MG tablet TAKE 1 TABLET BY MOUTH TWO  TIMES DAILY WITH A MEAL 180 tablet 0  . ELIQUIS 2.5 MG TABS tablet TAKE 1 TABLET BY MOUTH  TWICE A DAY 180 tablet 1  . furosemide (LASIX) 20 MG tablet Take 1 tablet (20 mg total) by mouth daily. 90 tablet 3  . Multiple Vitamins-Minerals (ICAPS AREDS 2) CAPS Take 2 capsules by mouth daily.    [name] omeprazole (PRILOSEC) 20 MG capsule Take 1 capsule (20 mg total) 2 (two) times daily before a meal by mouth. 60 capsule 3  . Polyethyl Glycol-Propyl Glycol (SYSTANE ULTRA) 0.4-0.3 % SOLN Place 1-2 drops into both eyes 2 (two) times daily as needed.    . traZODone (DESYREL) 50 MG tablet Take 0.5-2 tablets (25-100 mg total) at bedtime as needed by mouth for  sleep. 60 tablet 3  . ciprofloxacin (CIPRO) 250 MG tablet Take 1 tablet (250 mg total) 2 (two) times daily by mouth. (Patient not taking: Reported on 03/19/2017) 14 tablet 0  . metroNIDAZOLE (FLAGYL) 500 MG tablet Take 1 tablet (500 mg total) 2 (two) times daily by mouth. (Patient not taking: Reported on 03/19/2017) 14 tablet 0   No current facility-administered medications for this visit.     Patient confirms/reports the following allergies:  Allergies  Allergen Reactions  . Penicillins Hives    Has patient had a PCN reaction causing immediate rash, facial/tongue/throat swelling, SOB or lightheadedness with hypotension: Yes Has patient had a PCN reaction causing severe rash involving mucus membranes or skin necrosis: No Has patient had a PCN reaction that required hospitalization No Has patient had a PCN reaction occurring within the last 10 years: Yes If all of the above answers are "NO", then may proceed with Cephalosporin use.   . Sulfa Antibiotics Rash    No orders of the defined types were placed in this encounter.   AUTHORIZATION INFORMATION Primary Insurance: ,  ID #:  Group #:  Pre-Cert / Auth required:  Pre-Cert / Auth #:   Secondary Insurance:   ID #:  Group #:  Pre-Cert / Auth required:  Pre-Cert / Auth #:   SCHEDULE INFORMATION: Procedure has been scheduled as follows:  Date:  Time:   Location:   This Gastroenterology Pre-Precedure Review Form is being routed to the following provider(s): {R[facility] PROVIDERS:[id number]}

## 2017-03-19 NOTE — Telephone Encounter (Deleted)
Gastroenterology Pre-Procedure Review  Request Date: Requesting Physician:   PATIENT REVIEW QUESTIONS: The patient responded to the following health history questions as indicated:    1. Diabetes Melitis: {Yes/No:19989} 2. Joint replacements in the past 12 months: {Yes/No:19989} 3. Major health problems in the past 3 months: {Yes/No:19989} 4. Has an artificial valve or MVP: {Yes/No:19989} 5. Has a defibrillator: {Yes/No:19989} 6. Has been advised in past to take antibiotics in advance of a procedure like teeth cleaning: {Yes/No:19989} 7. Family history of colon cancer: {Yes/No:19989}  8. Alcohol Use: {Yes/No:19989} 9. History of sleep apnea: {Yes/No:19989}  10. History of coronary artery or other vascular stents placed within the last 12 months: {Yes/No:19989} 11. History of any prior anesthesia complications: {OBS/JG:28366}    MEDICATIONS & ALLERGIES:    Patient reports the following regarding taking any blood thinners:   Plavix? {Yes/No:19989} Aspirin? {Yes/No:19989} Coumadin? {Yes/No:19989} Brilinta? {Yes/No:19989} Xarelto? {Yes/No:19989} Eliquis? {Yes/No:19989} Pradaxa? {Yes/No:19989} Savaysa? {Yes/No:19989} Effient? {Yes/No:19989}  Patient confirms/reports the following medications:  Current Outpatient Medications  Medication Sig Dispense Refill  . acetaminophen (TYLENOL) 500 MG tablet Take 500 mg by mouth every 6 (six) hours as needed.    . carvedilol (COREG) 6.25 MG tablet TAKE 1 TABLET BY MOUTH TWO  TIMES DAILY WITH A MEAL 180 tablet 0  . ELIQUIS 2.5 MG TABS tablet TAKE 1 TABLET BY MOUTH  TWICE A DAY 180 tablet 1  . furosemide (LASIX) 20 MG tablet Take 1 tablet (20 mg total) by mouth daily. 90 tablet 3  . Multiple Vitamins-Minerals (ICAPS AREDS 2) CAPS Take 2 capsules by mouth daily.    [name] omeprazole (PRILOSEC) 20 MG capsule Take 1 capsule (20 mg total) 2 (two) times daily before a meal by mouth. 60 capsule 3  . Polyethyl Glycol-Propyl Glycol (SYSTANE ULTRA) 0.4-0.3 %  SOLN Place 1-2 drops into both eyes 2 (two) times daily as needed.    . traZODone (DESYREL) 50 MG tablet Take 0.5-2 tablets (25-100 mg total) at bedtime as needed by mouth for sleep. 60 tablet 3  . ciprofloxacin (CIPRO) 250 MG tablet Take 1 tablet (250 mg total) 2 (two) times daily by mouth. (Patient not taking: Reported on 03/19/2017) 14 tablet 0  . metroNIDAZOLE (FLAGYL) 500 MG tablet Take 1 tablet (500 mg total) 2 (two) times daily by mouth. (Patient not taking: Reported on 03/19/2017) 14 tablet 0   No current facility-administered medications for this visit.     Patient confirms/reports the following allergies:  Allergies  Allergen Reactions  . Penicillins Hives    Has patient had a PCN reaction causing immediate rash, facial/tongue/throat swelling, SOB or lightheadedness with hypotension: Yes Has patient had a PCN reaction causing severe rash involving mucus membranes or skin necrosis: No Has patient had a PCN reaction that required hospitalization No Has patient had a PCN reaction occurring within the last 10 years: Yes If all of the above answers are "NO", then may proceed with Cephalosporin use.   . Sulfa Antibiotics Rash    No orders of the defined types were placed in this encounter.   AUTHORIZATION INFORMATION Primary Insurance: ***,  ID #: ***,  Group #: *** Pre-Cert / Auth required: {QHU/TM-LY:650354} Pre-Cert / Auth #: ***  Secondary Insurance: ***,  ID #: ***,  Group #: *** Pre-Cert / Auth required: {SFK/CL-EX:517001} Pre-Cert / Auth #: ***  SCHEDULE INFORMATION: Procedure has been scheduled as follows:  Date: ***, Time: ***  Location: ***  This Gastroenterology Pre-Precedure Review Form is being routed to the following provider(s): {  [facility] PROVIDERS:[id number]}

## 2017-03-19 NOTE — Telephone Encounter (Signed)
See an updated triage, difficulty getting this one to combine the allergies and meds with the note.

## 2017-03-19 NOTE — Telephone Encounter (Deleted)
Gastroenterology Pre-Procedure Review  Request Date: 03/19/2017 Requesting Physician: Dr. Oneida Alar  PATIENT REVIEW QUESTIONS: The patient responded to the following health history questions as indicated:    1. Diabetes Melitis: no 2. Joint replacements in the past 12 months: no 3. Major health problems in the past 3 months: no 4. Has an artificial valve or MVP: no 5. Has a defibrillator: no     PT HAS PACEMAKER 6. Has been advised in past to take antibiotics in advance of a procedure like teeth cleaning: no 7. Family history of colon cancer: no  8. Alcohol Use: OCCASIONALLY 9. History of sleep apnea: no  10. History of coronary artery or other vascular stents placed within the last 12 months: no 11. History of any prior anesthesia complications: no    MEDICATIONS & ALLERGIES:    Patient reports the following regarding taking any blood thinners:   Plavix? no Aspirin? no Coumadin? no Brilinta? no Xarelto?no Eliquis? yes Pradaxa? no Savaysa? no Effient? no  Patient confirms/reports the following medications:  Current Outpatient Medications  Medication Sig Dispense Refill  . acetaminophen (TYLENOL) 500 MG tablet Take 500 mg by mouth every 6 (six) hours as needed.    . carvedilol (COREG) 6.25 MG tablet TAKE 1 TABLET BY MOUTH TWO  TIMES DAILY WITH A MEAL 180 tablet 0  . ELIQUIS 2.5 MG TABS tablet TAKE 1 TABLET BY MOUTH  TWICE A DAY 180 tablet 1  . furosemide (LASIX) 20 MG tablet Take 1 tablet (20 mg total) by mouth daily. 90 tablet 3  . Multiple Vitamins-Minerals (ICAPS AREDS 2) CAPS Take 2 capsules by mouth daily.    [name] omeprazole (PRILOSEC) 20 MG capsule Take 1 capsule (20 mg total) 2 (two) times daily before a meal by mouth. 60 capsule 3  . Polyethyl Glycol-Propyl Glycol (SYSTANE ULTRA) 0.4-0.3 % SOLN Place 1-2 drops into both eyes 2 (two) times daily as needed.    . traZODone (DESYREL) 50 MG tablet Take 0.5-2 tablets (25-100 mg total) at bedtime as needed by mouth for sleep. 60  tablet 3  . ciprofloxacin (CIPRO) 250 MG tablet Take 1 tablet (250 mg total) 2 (two) times daily by mouth. (Patient not taking: Reported on 03/19/2017) 14 tablet 0  . metroNIDAZOLE (FLAGYL) 500 MG tablet Take 1 tablet (500 mg total) 2 (two) times daily by mouth. (Patient not taking: Reported on 03/19/2017) 14 tablet 0   No current facility-administered medications for this visit.     Patient confirms/reports the following allergies:  Allergies  Allergen Reactions  . Penicillins Hives    Has patient had a PCN reaction causing immediate rash, facial/tongue/throat swelling, SOB or lightheadedness with hypotension: Yes Has patient had a PCN reaction causing severe rash involving mucus membranes or skin necrosis: No Has patient had a PCN reaction that required hospitalization No Has patient had a PCN reaction occurring within the last 10 years: Yes If all of the above answers are "NO", then may proceed with Cephalosporin use.   . Sulfa Antibiotics Rash    No orders of the defined types were placed in this encounter.   AUTHORIZATION INFORMATION Primary Insurance: ***,  ID #: ***,  Group #: *** Pre-Cert / Auth required: {WSF/KC-LE:[id number]} Pre-Cert / Auth #: ***  Secondary Insurance: ***,  ID #: ***,  Group #: *** Pre-Cert / Auth required: {FVC/BS-WH:675916} Pre-Cert / Auth #: ***  SCHEDULE INFORMATION: Procedure has been scheduled as follows:  Date: ***, Time: ***  Location: ***  This Gastroenterology Pre-Precedure Review  Form is being routed to the following provider(s): {R[facility] PROVIDERS:[id number]}

## 2017-03-19 NOTE — Telephone Encounter (Signed)
See separate triage.  

## 2017-03-19 NOTE — Telephone Encounter (Signed)
Gastroenterology Pre-Procedure Review  Request Date:03/19/2017 Requesting Physician: Dr. Oneida Alar  PATIENT REVIEW QUESTIONS: The patient responded to the following health history questions as indicated:    1. Diabetes Melitis: no 2. Joint replacements in the past 12 months:NO 3. Major health problems in the past 3 months: no 4. Has an artificial valve or MVP: no 5. Has a defibrillator: NO     Pt has a pacemaker 6. Has been advised in past to take antibiotics in advance of a procedure like teeth cleaning: no 7. Family history of colon cancer: no  8. Alcohol Use: Occasionally 9. History of sleep apnea: no  10. History of coronary artery or other vascular stents placed within the last 12 months: no 11. History of any prior anesthesia complications: no    MEDICATIONS & ALLERGIES:    Patient reports the following regarding taking any blood thinners:   Plavix? no Aspirin? no Coumadin? no Brilinta? no Xarelto? no Eliquis? YES Pradaxa? no Savaysa? no Effient? no  Patient confirms/reports the following medications:  Current Outpatient Medications  Medication Sig Dispense Refill  . acetaminophen (TYLENOL) 500 MG tablet Take 500 mg by mouth every 6 (six) hours as needed.    . carvedilol (COREG) 6.25 MG tablet TAKE 1 TABLET BY MOUTH TWO  TIMES DAILY WITH A MEAL 180 tablet 0  . ELIQUIS 2.5 MG TABS tablet TAKE 1 TABLET BY MOUTH  TWICE A DAY 180 tablet 1  . furosemide (LASIX) 20 MG tablet Take 1 tablet (20 mg total) by mouth daily. 90 tablet 3  . Multiple Vitamins-Minerals (ICAPS AREDS 2) CAPS Take 2 capsules by mouth daily.    [name] omeprazole (PRILOSEC) 20 MG capsule Take 1 capsule (20 mg total) 2 (two) times daily before a meal by mouth. 60 capsule 3  . Polyethyl Glycol-Propyl Glycol (SYSTANE ULTRA) 0.4-0.3 % SOLN Place 1-2 drops into both eyes 2 (two) times daily as needed.    . traZODone (DESYREL) 50 MG tablet Take 0.5-2 tablets (25-100 mg total) at bedtime as needed by mouth for sleep. 60  tablet 3  . ciprofloxacin (CIPRO) 250 MG tablet Take 1 tablet (250 mg total) 2 (two) times daily by mouth. (Patient not taking: Reported on 03/19/2017) 14 tablet 0  . metroNIDAZOLE (FLAGYL) 500 MG tablet Take 1 tablet (500 mg total) 2 (two) times daily by mouth. (Patient not taking: Reported on 03/19/2017) 14 tablet 0   No current facility-administered medications for this visit.     Patient confirms/reports the following allergies:  Allergies  Allergen Reactions  . Penicillins Hives    Has patient had a PCN reaction causing immediate rash, facial/tongue/throat swelling, SOB or lightheadedness with hypotension: Yes Has patient had a PCN reaction causing severe rash involving mucus membranes or skin necrosis: No Has patient had a PCN reaction that required hospitalization No Has patient had a PCN reaction occurring within the last 10 years: Yes If all of the above answers are "NO", then may proceed with Cephalosporin use.   . Sulfa Antibiotics Rash    No orders of the defined types were placed in this encounter.   AUTHORIZATION INFORMATION Primary Insurance:  ID #:  Group #:  Pre-Cert / Auth required:  Pre-Cert / Auth #:   Secondary Insurance:  ID #:   Group #:  Pre-Cert / Auth required:  Pre-Cert / Auth #:   SCHEDULE INFORMATION: Procedure has been scheduled as follows:  Date:  Time:   Location:   This Gastroenterology Pre-Precedure Review Form is being routed to the following provider(s): Barney Drain, MD

## 2017-03-19 NOTE — Telephone Encounter (Signed)
Patient returned call, please call back when you get a chance

## 2017-03-20 ENCOUNTER — Encounter (HOSPITAL_COMMUNITY): Payer: Self-pay | Admitting: Gastroenterology

## 2017-03-20 ENCOUNTER — Other Ambulatory Visit: Payer: Self-pay

## 2017-03-20 ENCOUNTER — Emergency Department (HOSPITAL_COMMUNITY)
Admission: EM | Admit: 2017-03-20 | Discharge: 2017-03-20 | Disposition: A | Discharge status: Home / Self Care | Payer: Medicare Other | Attending: Emergency Medicine | Admitting: Emergency Medicine

## 2017-03-20 ENCOUNTER — Emergency Department (HOSPITAL_COMMUNITY): Payer: Medicare Other

## 2017-03-20 DIAGNOSIS — I5032 Chronic diastolic (congestive) heart failure: Secondary | ICD-10-CM | POA: Insufficient documentation

## 2017-03-20 DIAGNOSIS — N183 Chronic kidney disease, stage 3 (moderate): Secondary | ICD-10-CM | POA: Insufficient documentation

## 2017-03-20 DIAGNOSIS — I130 Hypertensive heart and chronic kidney disease with heart failure and stage 1 through stage 4 chronic kidney disease, or unspecified chronic kidney disease: Secondary | ICD-10-CM | POA: Diagnosis not present

## 2017-03-20 DIAGNOSIS — R1030 Lower abdominal pain, unspecified: Secondary | ICD-10-CM | POA: Diagnosis not present

## 2017-03-20 DIAGNOSIS — Z87891 Personal history of nicotine dependence: Secondary | ICD-10-CM | POA: Diagnosis not present

## 2017-03-20 DIAGNOSIS — R109 Unspecified abdominal pain: Secondary | ICD-10-CM | POA: Diagnosis not present

## 2017-03-20 DIAGNOSIS — Z7901 Long term (current) use of anticoagulants: Secondary | ICD-10-CM | POA: Insufficient documentation

## 2017-03-20 DIAGNOSIS — Z950 Presence of cardiac pacemaker: Secondary | ICD-10-CM | POA: Insufficient documentation

## 2017-03-20 DIAGNOSIS — R10814 Left lower quadrant abdominal tenderness: Secondary | ICD-10-CM | POA: Diagnosis not present

## 2017-03-20 DIAGNOSIS — K2970 Gastritis, unspecified, without bleeding: Secondary | ICD-10-CM | POA: Diagnosis not present

## 2017-03-20 DIAGNOSIS — Z79899 Other long term (current) drug therapy: Secondary | ICD-10-CM | POA: Insufficient documentation

## 2017-03-20 DIAGNOSIS — R197 Diarrhea, unspecified: Secondary | ICD-10-CM | POA: Diagnosis not present

## 2017-03-20 LAB — COMPREHENSIVE METABOLIC PANEL
ALK PHOS: 48 U/L (ref 38–126)
ALT: 13 U/L — AB (ref 14–54)
ANION GAP: 10 (ref 5–15)
AST: 27 U/L (ref 15–41)
Albumin: 4 g/dL (ref 3.5–5.0)
BUN: 16 mg/dL (ref 6–20)
CALCIUM: 9.1 mg/dL (ref 8.9–10.3)
CO2: 23 mmol/L (ref 22–32)
CREATININE: 1.37 mg/dL — AB (ref 0.44–1.00)
Chloride: 104 mmol/L (ref 101–111)
GFR, EST AFRICAN AMERICAN: 39 mL/min — AB (ref >60)
GFR, EST NON AFRICAN AMERICAN: 33 mL/min — AB (ref >60)
Glucose, Bld: 109 mg/dL — ABNORMAL HIGH (ref 65–99)
Potassium: 3.3 mmol/L — ABNORMAL LOW (ref 3.5–5.1)
Sodium: 137 mmol/L (ref 135–145)
TOTAL PROTEIN: 6.8 g/dL (ref 6.5–8.1)
Total Bilirubin: 0.7 mg/dL (ref 0.3–1.2)

## 2017-03-20 LAB — CBC
HCT: 35.5 % — ABNORMAL LOW (ref 36.0–46.0)
Hemoglobin: 12.2 g/dL (ref 12.0–15.0)
MCH: 34.8 pg — ABNORMAL HIGH (ref 26.0–34.0)
MCHC: 34.4 g/dL (ref 30.0–36.0)
MCV: 101.1 fL — ABNORMAL HIGH (ref 78.0–100.0)
PLATELETS: 196 10*3/uL (ref 150–400)
RBC: 3.51 MIL/uL — AB (ref 3.87–5.11)
RDW: 14.6 % (ref 11.5–15.5)
WBC: 5.7 10*3/uL (ref 4.0–10.5)

## 2017-03-20 LAB — TYPE AND SCREEN
ABO/RH(D): A NEG
ANTIBODY SCREEN: NEGATIVE

## 2017-03-20 LAB — LIPASE, BLOOD: LIPASE: 34 U/L (ref 11–51)

## 2017-03-20 MED ORDER — MORPHINE SULFATE (PF) 2 MG/ML IV SOLN
2.0000 mg | Freq: Once | INTRAVENOUS | Status: AC
  Administered 2017-03-20: 2 mg via INTRAVENOUS
  Filled 2017-03-20: qty 1

## 2017-03-20 MED ORDER — ONDANSETRON HCL 4 MG/2ML IJ SOLN
4.0000 mg | Freq: Once | INTRAMUSCULAR | Status: AC
  Administered 2017-03-20: 4 mg via INTRAVENOUS
  Filled 2017-03-20: qty 2

## 2017-03-20 MED ORDER — FENTANYL CITRATE (PF) 100 MCG/2ML IJ SOLN
25.0000 ug | Freq: Once | INTRAMUSCULAR | Status: AC
  Administered 2017-03-20: 25 ug via INTRAVENOUS
  Filled 2017-03-20: qty 2

## 2017-03-20 MED ORDER — SODIUM CHLORIDE 0.9 % IV BOLUS (SEPSIS)
500.0000 mL | Freq: Once | INTRAVENOUS | Status: AC
  Administered 2017-03-20: 500 mL via INTRAVENOUS

## 2017-03-20 MED ORDER — IOPAMIDOL (ISOVUE-300) INJECTION 61%
75.0000 mL | Freq: Once | INTRAVENOUS | Status: AC | PRN
  Administered 2017-03-20: 75 mL via INTRAVENOUS

## 2017-03-20 NOTE — ED Notes (Signed)
Pt ready for discharge, she is asked who can come and take her home.  She is irate saying why is she being sent home with abd pain

## 2017-03-20 NOTE — ED Notes (Signed)
Dr Lacinda Axon informed and asked to speak with pt

## 2017-03-20 NOTE — Telephone Encounter (Signed)
MOVI PREP SPLIT DOSING- CLEAR LIQUIDS WITH BREAKFAST.  HOLD LASIX THE EVENING BEFORE AND THE MORNING OF THE COLONOSCOPY. HOLD ELIQUIS 48 HRS PRIOR TO A COLONOSCOPY.

## 2017-03-20 NOTE — Discharge Instructions (Signed)
Tests showed no life-threatening condition.  I am uncertain why you have a headache.  Continue to take 2 Tylenol tablets 3 times a day.  Follow-up your primary care doctor.

## 2017-03-20 NOTE — ED Notes (Signed)
Pt complains of headache, as well as hunger

## 2017-03-20 NOTE — ED Triage Notes (Signed)
Colonoscopy 2 weeks ago D x 2 days Reports blood in toilet today

## 2017-03-20 NOTE — ED Notes (Signed)
Dr Lacinda Axon in to discuss findings with pt, why she is being discharged, her labs, her radiology reports  Pt continues to be upset that she is to go home

## 2017-03-20 NOTE — ED Notes (Signed)
Pt is distressed that she is to be discharged=she reports she has called a friend to come after her to take her to [place] for evaluation because she knows when something is wrong with her body and machines do not know  She signed discharge instructions and stated that she did not want them, she merely wants to "sign out" and that we have done that which we have done before which is send her home.   Pt to bathroom prior to being wheeled in [facility] to witing room to await her friend to take her to [facility]

## 2017-03-20 NOTE — Telephone Encounter (Signed)
See separate triage.  

## 2017-03-20 NOTE — ED Notes (Signed)
Patient ambulatory to bathroom without assistance.  Patient refused any help and stated "I can do this myself".

## 2017-03-20 NOTE — Telephone Encounter (Signed)
MOD SEDATION

## 2017-03-20 NOTE — Telephone Encounter (Signed)
Does she need to be done in OR?

## 2017-03-20 NOTE — ED Provider Notes (Signed)
[facility] EMERGENCY DEPARTMENT Provider Note   CSN: [id number] Arrival date & time: 03/20/17  1150     History   Chief Complaint Chief Complaint  Patient presents with  . Abdominal Pain    p colonoscopy    HPI Kimberly [name] is a 83 y.o. female.  Lower abdominal pain for 3-4 days.  She is status post colonoscopy on 03/03/17.  She states 1 polyp was discovered.  She is eating without vomiting.  She notedsmall amount of blood in her stool several days ago, but none presently.  No fever, sweats, chills, chest pain, dyspnea, dysuria.  Review of systems positive for headache      Past Medical History:  Diagnosis Date  . Acute blood loss anemia 10/11/2012  . Acute diverticulitis 08/24/2013  . Acute on chronic combined systolic and diastolic CHF, NYHA class 4 ([place]) 11/15/2013  . Antral ulcer 10/11/2012  . Arrhythmia    atrial fibb  . Atrial fibrillation ([place])   . Cardiomyopathy, nonischemic ([place])   . Chronic anticoagulation 10/12/2012  . CKD (chronic kidney disease) stage 3, GFR 30-59 ml/min (HCC) 10/12/2012  . Depression   . Erosive esophagitis 10/11/2012  . Fibromyalgia   . Glaucoma   . H/O echocardiogram 2007   EF 40-45%,         . Hypertension   . Osteoarthritis   . Pacemaker    Last saw cards 07/2013  . Scoliosis     Patient Active Problem List   Diagnosis Date Noted  . Lower abdominal pain   . Lower GI bleed   . Hematochezia 03/01/2017  . Glaucoma 02/28/2017  . Pacemaker 03/21/2015  . Non-ischemic cardiomyopathy ([place]) 11/28/2014  . PUD (peptic ulcer disease) 06/14/2014  . History of bipolar disorder   . Hypokalemia 05/26/2014  . Fall at home 05/26/2014  . Chronic diastolic heart failure ([place]) 11/15/2013  . Complete heart block ([place]) 11/15/2013  . Inability to ambulate due to ankle or foot 09/05/2013  . Unspecified constipation 08/26/2013  . Anemia 08/25/2013  . CKD (chronic kidney disease) stage 3, GFR 30-59 ml/min (HCC) 10/12/2012  . Chronic anticoagulation  10/12/2012  . Osteoarthritis   . Permanent atrial fibrillation ([place])   . Biventricular cardiac pacemaker - [employer] Consulta   . Fibromyalgia   . Essential hypertension   . Depression     Past Surgical History:  Procedure Laterality Date  . ABDOMINAL HYSTERECTOMY    . APPENDECTOMY    . BACK SURGERY    . BIOPSY  03/03/2017   Performed by Danie Binder, MD at [place]  . BREAST SURGERY    . CARDIAC CATHETERIZATION  12/08/2005   LAD AND LEFT MAIN WITH NO HIGH-GRADE STENOSIS. MILD DISEASE IN THE CX AND LAD SYSTEM. SEVERE LV DYSFUNCTION WITH DILATION OF THE LV. EF 15-20%. LV END-DIASTOLIC PRESSURE IS 90. +1 MR.  . CHOLECYSTECTOMY    . COLONOSCOPY N/A 03/03/2017   Performed by Danie Binder, MD at [place]  . CYSTOSCOPY WITH URETHRAL DILITATION N/A 02/24/2013   Performed by Beaulah Dinning I, MD at [facility]  . DOPPLER ECHOCARDIOGRAPHY N/A 05/30/2010   LV SIZE IS NORMAL. LV SYSTOLIC FUNCTION IS LOW NORMAL. EF=50-55%. MILD INFERIOR HYPOKINESIS.MILD TO MODERATE POSTERIOR WALL HYPOKINESIS.PACEMAKER LEAD IN THE RV. LA IS MILDLY DILATED. RA IS MODERATE TO SEVERLY DILATED. PACEMAKER LEAD IN THE RA. MILD CALCICICATION OF THE MV APPARATUS. MODERATE MR. MILD TO MODERATE TR. MILD PHTN.AV MILDLY SCLEROTIC.  [name] ESOPHAGOGASTRODUODENOSCOPY (EGD) N/A  03/03/2017   Performed by Danie Binder, MD at [place]  . ESOPHAGOGASTRODUODENOSCOPY (EGD) N/A 10/13/2012   Performed by Daneil Dolin, MD at [place]  . HERNIA REPAIR     right inguinal hernia and umbilical  . LOWETR EXT VENOUS Bilateral 11-08-10   R & L- NO EVIDENCE OF THROMBUS OR THROMBOPHLEBITIS. THERE IS MILD AMOUNT OF SUBCUTANEOUS EDEMA NOTED WITHIN THE LEFT CALF AND ANKLE. R & L GSV AND SSV- NO VENOUS INSUFF NOTED.  [name] NECK SURGERY    . NUCLEAR STRESS TEST N/A 02/13/2009   NORMAL PATTERN OF PERFUSION IN ALL REGIONS. POST STRESS VENTICULAR SIZE IS NORMAL. POST  STESS EF 85%.  NORMAL MYOCARDIAL PERFUSION STUDY.  [name] PACEMAKER  INSERTION    . POLYPECTOMY  03/03/2017   Performed by Danie Binder, MD at [place]  . TONSILLECTOMY    . YAG LASER APPLICATION Bilateral 9/89/2119   Performed by Williams Che, MD at [facility]    OB History    Gravida Para Term Preterm AB Living   5 2 1 1 3 2    SAB TAB Ectopic Multiple Live Births   3               Home Medications    Prior to Admission medications   Medication Sig Start Date End Date Taking? Authorizing Provider  acetaminophen (TYLENOL) 500 MG tablet Take 500 mg every 6 (six) hours as needed by mouth for headache.    Yes [provider]  acidophilus (RISAQUAD) CAPS capsule Take 1 capsule daily by mouth.   Yes [provider]  carvedilol (COREG) 6.25 MG tablet TAKE 1 TABLET BY MOUTH TWO  TIMES DAILY WITH A MEAL 01/13/17  Yes Croitoru, Mihai, MD  ELIQUIS 2.5 MG TABS tablet TAKE 1 TABLET BY MOUTH  TWICE A DAY 09/30/16  Yes Croitoru, Mihai, MD  furosemide (LASIX) 20 MG tablet Take 1 tablet (20 mg total) by mouth daily. 04/07/16  Yes Croitoru, Mihai, MD  Multiple Vitamins-Minerals (ICAPS AREDS 2) CAPS Take 2 capsules by mouth daily.   Yes [provider]  omeprazole (PRILOSEC) 20 MG capsule Take 1 capsule (20 mg total) 2 (two) times daily before a meal by mouth. 03/12/17  Yes Dettinger, Fransisca Kaufmann, MD  pantoprazole (PROTONIX) 40 MG tablet Take 40 mg daily by mouth.   Yes [provider]  Polyethyl Glycol-Propyl Glycol (SYSTANE ULTRA) 0.4-0.3 % SOLN Place 1-2 drops into both eyes 2 (two) times daily as needed.   Yes [provider]  sodium chloride (OCEAN) 0.65 % SOLN nasal spray Place 1 spray as needed into both nostrils for congestion.   Yes [provider]  traZODone (DESYREL) 50 MG tablet Take 0.5-2 tablets (25-100 mg total) at bedtime as needed by mouth for sleep. 03/13/17  Yes Dettinger, Fransisca Kaufmann, MD  ciprofloxacin (CIPRO) 250 MG tablet Take 1 tablet (250 mg total) 2 (two) times daily by mouth. Patient not  taking: Reported on 03/19/2017 03/12/17   Dettinger, Fransisca Kaufmann, MD  metroNIDAZOLE (FLAGYL) 500 MG tablet Take 1 tablet (500 mg total) 2 (two) times daily by mouth. Patient not taking: Reported on 03/19/2017 03/12/17   Dettinger, Fransisca Kaufmann, MD    Family History Family History  Problem Relation Age of Onset  . Cancer Sister   . Asthma Sister   . Heart failure Brother   . Pulmonary embolism Brother   . Cancer Brother   . Colon cancer Neg Hx  Social History Social History   Tobacco Use  . Smoking status: Former Smoker    Packs/day: 0.50    Years: 30.00    Pack years: 15.00    Types: Cigarettes    Last attempt to quit: 12/13/2004    Years since quitting: 12.2  . Smokeless tobacco: Never Used  . Tobacco comment: former smoker  Substance Use Topics  . Alcohol use: Yes    Alcohol/week: 0.0 oz    Comment: history of drinking a 1/2 glass of wine in the evening  . Drug use: No     Allergies   Penicillins and Sulfa antibiotics   Review of Systems Review of Systems  All other systems reviewed and are negative.    Physical Exam Updated Vital Signs BP (!) 145/65   Pulse (!) 55   Temp 97.8 F (36.6 C) (Oral)   Resp 14   Ht 5\' 6"  (1.676 m)   Wt 67.6 kg (149 lb)   SpO2 (!) 89%   BMI 24.05 kg/m   Physical Exam  Constitutional: She is oriented to person, place, and time.  nad  HENT:  Head: Normocephalic and atraumatic.  Eyes: Conjunctivae are normal.  Neck: Neck supple.  Cardiovascular: Normal rate and regular rhythm.  Pulmonary/Chest: Effort normal and breath sounds normal.  Abdominal: Soft. Bowel sounds are normal.  Minimal lower abdominal tenderness.  Musculoskeletal: Normal range of motion.  Neurological: She is alert and oriented to person, place, and time.  Skin: Skin is warm and dry.  Psychiatric: She has a normal mood and affect. Her behavior is normal.  Nursing note and vitals reviewed.    ED Treatments / Results  Labs (all labs ordered are listed,  but only abnormal results are displayed) Labs Reviewed  COMPREHENSIVE METABOLIC PANEL - Abnormal; Notable for the following components:      Result Value   Potassium 3.3 (*)    Glucose, Bld 109 (*)    Creatinine, Ser 1.37 (*)    ALT 13 (*)    GFR calc non Af Amer 33 (*)    GFR calc Af Amer 39 (*)    All other components within normal limits  CBC - Abnormal; Notable for the following components:   RBC 3.51 (*)    HCT 35.5 (*)    MCV 101.1 (*)    MCH 34.8 (*)    All other components within normal limits  LIPASE, BLOOD  POC OCCULT BLOOD, ED  TYPE AND SCREEN    EKG  EKG Interpretation None       Radiology Ct Abdomen Pelvis W Contrast  Result Date: 03/20/2017 CLINICAL DATA:  Severe lower abdominal pain. Blood in stools today. Colonoscopy 2 weeks ago. Diarrhea for the past 2 days. EXAM: CT ABDOMEN AND PELVIS WITH CONTRAST TECHNIQUE: Multidetector CT imaging of the abdomen and pelvis was performed using the standard protocol following bolus administration of intravenous contrast. CONTRAST:  61mL ISOVUE-300 IOPAMIDOL (ISOVUE-300) INJECTION 61% COMPARISON:  03/09/2017. FINDINGS: Lower chest: A moderate-sized hiatal hernia is again demonstrated. Intracardiac pacer and AICD leads. No acute abnormality. Hepatobiliary: Multiple liver cysts are again demonstrated. Cholecystectomy clips. Pancreas: Unremarkable. No pancreatic ductal dilatation or surrounding inflammatory changes. Spleen: Normal in size without focal abnormality. Adrenals/Urinary Tract: Adrenal glands are unremarkable. Kidneys are normal, without renal calculi, focal lesion, or hydronephrosis. Bladder is unremarkable. Stomach/Bowel: Multiple sigmoid colon diverticula are again demonstrated. Moderate-sized hiatal hernia. Unremarkable small bowel. No evidence of appendicitis. Vascular/Lymphatic: Atheromatous arterial calcifications without aneurysm. No enlarged lymph  nodes. Reproductive: Status post hysterectomy. No adnexal masses.  Other: Previously noted small umbilical hernia containing fat. Musculoskeletal: Stable marked lumbar spine degenerative changes, marked dextroconvex scoliosis and old T12 vertebral compression deformity. IMPRESSION: 1. No acute abnormality. 2. Stable moderate-sized hiatal hernia. 3. Sigmoid diverticulosis. Electronically Signed   By: Claudie Revering M.D.   On: 03/20/2017 14:12    Procedures Procedures (including critical care time)  Medications Ordered in ED Medications  sodium chloride 0.9 % bolus 500 mL (0 mLs Intravenous Stopped 03/20/17 1505)  ondansetron (ZOFRAN) injection 4 mg (4 mg Intravenous Given 03/20/17 1255)  morphine 2 MG/ML injection 2 mg (2 mg Intravenous Given 03/20/17 1255)  iopamidol (ISOVUE-300) 61 % injection 75 mL (75 mLs Intravenous Contrast Given 03/20/17 1354)  fentaNYL (SUBLIMAZE) injection 25 mcg (25 mcg Intravenous Given 03/20/17 1619)     Initial Impression / Assessment and Plan / ED Course  I have reviewed the triage vital signs and the nursing notes.  Pertinent labs & imaging results that were available during my care of the patient were reviewed by me and considered in my medical decision making (see chart for details).     Patient is in no acute distress.  Vital signs are stable screening labs, CT abdomen/pelvis showed no acute findings.  Patient is comfortably eating without vomiting.  I discussed the test findings in great detail with the patient.  I empathized with her pain, but did not see a good reason to admit her to the hospital.  She was unhappy with this discussion.  She wanted to be discharged and evaluated at [facility].  Final Clinical Impressions(s) / ED Diagnoses   Final diagnoses:  Lower abdominal pain    ED Discharge Orders    None       Nat Christen, MD 03/20/17 [phone]

## 2017-03-20 NOTE — ED Notes (Signed)
To CT

## 2017-03-21 ENCOUNTER — Observation Stay (HOSPITAL_COMMUNITY)
Admission: EM | Admit: 2017-03-21 | Discharge: 2017-03-24 | Disposition: A | Discharge status: Home / Self Care | Payer: Medicare Other | Attending: Internal Medicine | Admitting: Internal Medicine

## 2017-03-21 ENCOUNTER — Encounter (HOSPITAL_COMMUNITY): Payer: Self-pay | Admitting: Emergency Medicine

## 2017-03-21 DIAGNOSIS — Z7901 Long term (current) use of anticoagulants: Secondary | ICD-10-CM

## 2017-03-21 DIAGNOSIS — E876 Hypokalemia: Secondary | ICD-10-CM | POA: Insufficient documentation

## 2017-03-21 DIAGNOSIS — D649 Anemia, unspecified: Secondary | ICD-10-CM | POA: Diagnosis present

## 2017-03-21 DIAGNOSIS — I429 Cardiomyopathy, unspecified: Secondary | ICD-10-CM | POA: Insufficient documentation

## 2017-03-21 DIAGNOSIS — M1990 Unspecified osteoarthritis, unspecified site: Secondary | ICD-10-CM | POA: Insufficient documentation

## 2017-03-21 DIAGNOSIS — I5032 Chronic diastolic (congestive) heart failure: Secondary | ICD-10-CM | POA: Diagnosis present

## 2017-03-21 DIAGNOSIS — Z66 Do not resuscitate: Secondary | ICD-10-CM | POA: Diagnosis not present

## 2017-03-21 DIAGNOSIS — I130 Hypertensive heart and chronic kidney disease with heart failure and stage 1 through stage 4 chronic kidney disease, or unspecified chronic kidney disease: Secondary | ICD-10-CM | POA: Insufficient documentation

## 2017-03-21 DIAGNOSIS — Z8659 Personal history of other mental and behavioral disorders: Secondary | ICD-10-CM

## 2017-03-21 DIAGNOSIS — Z791 Long term (current) use of non-steroidal anti-inflammatories (NSAID): Secondary | ICD-10-CM | POA: Diagnosis not present

## 2017-03-21 DIAGNOSIS — A044 Other intestinal Escherichia coli infections: Secondary | ICD-10-CM | POA: Diagnosis not present

## 2017-03-21 DIAGNOSIS — Z950 Presence of cardiac pacemaker: Secondary | ICD-10-CM | POA: Insufficient documentation

## 2017-03-21 DIAGNOSIS — Z888 Allergy status to other drugs, medicaments and biological substances status: Secondary | ICD-10-CM | POA: Diagnosis not present

## 2017-03-21 DIAGNOSIS — Z79899 Other long term (current) drug therapy: Secondary | ICD-10-CM | POA: Diagnosis not present

## 2017-03-21 DIAGNOSIS — R1030 Lower abdominal pain, unspecified: Secondary | ICD-10-CM | POA: Diagnosis present

## 2017-03-21 DIAGNOSIS — I5043 Acute on chronic combined systolic (congestive) and diastolic (congestive) heart failure: Secondary | ICD-10-CM | POA: Diagnosis not present

## 2017-03-21 DIAGNOSIS — Z87891 Personal history of nicotine dependence: Secondary | ICD-10-CM | POA: Diagnosis not present

## 2017-03-21 DIAGNOSIS — K279 Peptic ulcer, site unspecified, unspecified as acute or chronic, without hemorrhage or perforation: Secondary | ICD-10-CM | POA: Diagnosis present

## 2017-03-21 DIAGNOSIS — N183 Chronic kidney disease, stage 3 unspecified: Secondary | ICD-10-CM | POA: Diagnosis present

## 2017-03-21 DIAGNOSIS — Z880 Allergy status to penicillin: Secondary | ICD-10-CM | POA: Diagnosis not present

## 2017-03-21 DIAGNOSIS — I10 Essential (primary) hypertension: Secondary | ICD-10-CM | POA: Diagnosis present

## 2017-03-21 DIAGNOSIS — R109 Unspecified abdominal pain: Secondary | ICD-10-CM | POA: Diagnosis not present

## 2017-03-21 DIAGNOSIS — R442 Other hallucinations: Secondary | ICD-10-CM | POA: Diagnosis present

## 2017-03-21 DIAGNOSIS — I482 Chronic atrial fibrillation: Secondary | ICD-10-CM | POA: Insufficient documentation

## 2017-03-21 DIAGNOSIS — M797 Fibromyalgia: Secondary | ICD-10-CM | POA: Diagnosis not present

## 2017-03-21 DIAGNOSIS — F319 Bipolar disorder, unspecified: Secondary | ICD-10-CM | POA: Diagnosis not present

## 2017-03-21 DIAGNOSIS — I4821 Permanent atrial fibrillation: Secondary | ICD-10-CM | POA: Diagnosis present

## 2017-03-21 DIAGNOSIS — A041 Enterotoxigenic Escherichia coli infection: Secondary | ICD-10-CM | POA: Diagnosis present

## 2017-03-21 LAB — CBC WITH DIFFERENTIAL/PLATELET
Basophils Absolute: 0 10*3/uL (ref 0.0–0.1)
Basophils Relative: 0 %
EOS PCT: 2 %
Eosinophils Absolute: 0.1 10*3/uL (ref 0.0–0.7)
HCT: 30.9 % — ABNORMAL LOW (ref 36.0–46.0)
HEMOGLOBIN: 10.3 g/dL — AB (ref 12.0–15.0)
LYMPHS ABS: 1.1 10*3/uL (ref 0.7–4.0)
LYMPHS PCT: 18 %
MCH: 33.4 pg (ref 26.0–34.0)
MCHC: 33.3 g/dL (ref 30.0–36.0)
MCV: 100.3 fL — AB (ref 78.0–100.0)
Monocytes Absolute: 0.9 10*3/uL (ref 0.1–1.0)
Monocytes Relative: 16 %
Neutro Abs: 3.6 10*3/uL (ref 1.7–7.7)
Neutrophils Relative %: 64 %
PLATELETS: 187 10*3/uL (ref 150–400)
RBC: 3.08 MIL/uL — AB (ref 3.87–5.11)
RDW: 15 % (ref 11.5–15.5)
WBC: 5.8 10*3/uL (ref 4.0–10.5)

## 2017-03-21 LAB — COMPREHENSIVE METABOLIC PANEL
ALK PHOS: 41 U/L (ref 38–126)
ALT: 9 U/L — AB (ref 14–54)
AST: 30 U/L (ref 15–41)
Albumin: 3.1 g/dL — ABNORMAL LOW (ref 3.5–5.0)
Anion gap: 8 (ref 5–15)
BILIRUBIN TOTAL: 0.9 mg/dL (ref 0.3–1.2)
BUN: 17 mg/dL (ref 6–20)
CALCIUM: 8.2 mg/dL — AB (ref 8.9–10.3)
CO2: 20 mmol/L — ABNORMAL LOW (ref 22–32)
CREATININE: 1.76 mg/dL — AB (ref 0.44–1.00)
Chloride: 108 mmol/L (ref 101–111)
GFR, EST AFRICAN AMERICAN: 29 mL/min — AB (ref >60)
GFR, EST NON AFRICAN AMERICAN: 25 mL/min — AB (ref >60)
Glucose, Bld: 109 mg/dL — ABNORMAL HIGH (ref 65–99)
Potassium: 4 mmol/L (ref 3.5–5.1)
Sodium: 136 mmol/L (ref 135–145)
TOTAL PROTEIN: 6 g/dL — AB (ref 6.5–8.1)

## 2017-03-21 LAB — URINALYSIS, ROUTINE W REFLEX MICROSCOPIC
BILIRUBIN URINE: NEGATIVE
GLUCOSE, UA: NEGATIVE mg/dL
Hgb urine dipstick: NEGATIVE
KETONES UR: NEGATIVE mg/dL
Leukocytes, UA: NEGATIVE
Nitrite: NEGATIVE
PH: 6 (ref 5.0–8.0)
Protein, ur: NEGATIVE mg/dL
Specific Gravity, Urine: 1.004 — ABNORMAL LOW (ref 1.005–1.030)

## 2017-03-21 LAB — RETICULOCYTES
RBC.: 3.49 MIL/uL — AB (ref 3.87–5.11)
RETIC COUNT ABSOLUTE: 62.8 10*3/uL (ref 19.0–186.0)
Retic Ct Pct: 1.8 % (ref 0.4–3.1)

## 2017-03-21 LAB — TYPE AND SCREEN
ABO/RH(D): A NEG
ANTIBODY SCREEN: NEGATIVE

## 2017-03-21 LAB — IRON AND TIBC
Iron: 45 ug/dL (ref 28–170)
SATURATION RATIOS: 15 % (ref 10.4–31.8)
TIBC: 308 ug/dL (ref 250–450)
UIBC: 263 ug/dL

## 2017-03-21 LAB — FOLATE: Folate: 9 ng/mL (ref >5.9)

## 2017-03-21 LAB — LACTIC ACID, PLASMA: LACTIC ACID, VENOUS: 1.9 mmol/L (ref 0.5–1.9)

## 2017-03-21 LAB — VITAMIN B12: Vitamin B-12: 293 pg/mL (ref 180–914)

## 2017-03-21 LAB — FERRITIN: FERRITIN: 162 ng/mL (ref 11–307)

## 2017-03-21 MED ORDER — NITROGLYCERIN 0.4 MG SL SUBL
SUBLINGUAL_TABLET | SUBLINGUAL | Status: AC
  Filled 2017-03-21: qty 1

## 2017-03-21 MED ORDER — TRAZODONE HCL 100 MG PO TABS
100.0000 mg | ORAL_TABLET | Freq: Every evening | ORAL | Status: DC | PRN
  Administered 2017-03-21 – 2017-03-23 (×3): 100 mg via ORAL
  Filled 2017-03-21 (×3): qty 1

## 2017-03-21 MED ORDER — ONDANSETRON HCL 4 MG PO TABS
4.0000 mg | ORAL_TABLET | Freq: Four times a day (QID) | ORAL | Status: DC | PRN
  Administered 2017-03-23: 4 mg via ORAL
  Filled 2017-03-21: qty 1

## 2017-03-21 MED ORDER — ONDANSETRON HCL 4 MG/2ML IJ SOLN
4.0000 mg | Freq: Four times a day (QID) | INTRAMUSCULAR | Status: DC | PRN
  Administered 2017-03-24: 4 mg via INTRAVENOUS
  Filled 2017-03-21: qty 2

## 2017-03-21 MED ORDER — SALINE SPRAY 0.65 % NA SOLN: 1.0000 | Freq: Two times a day (BID) | NASAL | Status: DC | PRN

## 2017-03-21 MED ORDER — PROSIGHT PO TABS: 1.0000 | ORAL_TABLET | Freq: Every day | ORAL | Status: DC

## 2017-03-21 MED ORDER — POLYVINYL ALCOHOL 1.4 % OP SOLN
1.0000 [drp] | Freq: Two times a day (BID) | OPHTHALMIC | Status: DC | PRN
  Administered 2017-03-22: 1 [drp] via OPHTHALMIC
  Administered 2017-03-22: 2 [drp] via OPHTHALMIC
  Filled 2017-03-21 (×2): qty 15

## 2017-03-21 MED ORDER — SODIUM CHLORIDE 0.9 % IV SOLN: 250.0000 mL | INTRAVENOUS | Status: DC | PRN

## 2017-03-21 MED ORDER — FUROSEMIDE 10 MG/ML IJ SOLN
40.0000 mg | Freq: Once | INTRAMUSCULAR | Status: AC
  Administered 2017-03-21: 40 mg via INTRAVENOUS
  Filled 2017-03-21: qty 4

## 2017-03-21 MED ORDER — RISAQUAD PO CAPS
1.0000 | ORAL_CAPSULE | Freq: Two times a day (BID) | ORAL | Status: DC
  Administered 2017-03-22 – 2017-03-24 (×5): 1 via ORAL
  Filled 2017-03-21 (×5): qty 1

## 2017-03-21 MED ORDER — SODIUM CHLORIDE 0.9% FLUSH: 3.0000 mL | INTRAVENOUS | Status: DC | PRN

## 2017-03-21 MED ORDER — FUROSEMIDE 20 MG PO TABS
20.0000 mg | ORAL_TABLET | Freq: Every day | ORAL | Status: DC
  Administered 2017-03-22 – 2017-03-24 (×3): 20 mg via ORAL
  Filled 2017-03-21 (×3): qty 1

## 2017-03-21 MED ORDER — ACETAMINOPHEN 500 MG PO TABS
1000.0000 mg | ORAL_TABLET | Freq: Four times a day (QID) | ORAL | Status: DC | PRN
  Administered 2017-03-21 – 2017-03-23 (×5): 1000 mg via ORAL
  Filled 2017-03-21 (×5): qty 2

## 2017-03-21 MED ORDER — PANTOPRAZOLE SODIUM 40 MG PO TBEC
40.0000 mg | DELAYED_RELEASE_TABLET | Freq: Every day | ORAL | Status: DC
  Administered 2017-03-22 – 2017-03-24 (×3): 40 mg via ORAL
  Filled 2017-03-21 (×3): qty 1

## 2017-03-21 MED ORDER — LOPERAMIDE HCL 2 MG PO CAPS
2.0000 mg | ORAL_CAPSULE | Freq: Four times a day (QID) | ORAL | Status: DC | PRN
  Administered 2017-03-21 – 2017-03-23 (×2): 2 mg via ORAL
  Filled 2017-03-21 (×2): qty 1

## 2017-03-21 MED ORDER — SODIUM CHLORIDE 0.9% FLUSH
3.0000 mL | Freq: Two times a day (BID) | INTRAVENOUS | Status: DC
  Administered 2017-03-21 – 2017-03-24 (×6): 3 mL via INTRAVENOUS

## 2017-03-21 NOTE — ED Triage Notes (Signed)
Pt. Stated, Donnald Garre had abdominal pain for 2 weeks, Ive been to the doctor and hospital 2 times and no one has told me anything.

## 2017-03-21 NOTE — H&P (Signed)
History and Physical    Kimberly [name] VZC:[id number] DOB: [birth year] DOA: 03/21/2017  PCP: Dettinger, Fransisca Kaufmann, MD  Patient coming from:  home  Chief Complaint:  Lower abdominal pain  HPI: Kimberly [name] is a 83 y.o. female with medical history significant of fibromyalgia, PUD, ckd, chf, comes in with 6 weeks of lower abdominal pain with diarrhea.  She has had a recent colonscopy and EGD which last month at [facility] she had polyp removed, internal hemmorhoid diffuse mod inflammation in stomach.  Placed on PPI.  She reports the pain and diarrhea have persisted.  No vomiting.  Pain is lower abdomen and bilateral.  Not upper.  Has h/o GERD and reflux but this is chronic and no different.  Reports no blood in stool but says it is dark.  Was seen in [place] yesterday and had neg ct abd/pelvis and hgb over 12.  Today she was being prepared to be discharged from ED but her hgb came back at 10.  Concern foroccult bleed and referred for admission for such.  No urinary complaints.  Review of Systems: As per HPI otherwise 10 point review of systems negative.   Past Medical History:  Diagnosis Date  . Acute blood loss anemia 10/11/2012  . Acute diverticulitis 08/24/2013  . Acute on chronic combined systolic and diastolic CHF, NYHA class 4 ([place]) 11/15/2013  . Antral ulcer 10/11/2012  . Arrhythmia    atrial fibb  . Atrial fibrillation ([place])   . Cardiomyopathy, nonischemic ([place])   . Chronic anticoagulation 10/12/2012  . CKD (chronic kidney disease) stage 3, GFR 30-59 ml/min (HCC) 10/12/2012  . Depression   . Erosive esophagitis 10/11/2012  . Fibromyalgia   . Glaucoma   . H/O echocardiogram 2007   EF 40-45%,         . Hypertension   . Osteoarthritis   . Pacemaker    Last saw cards 07/2013  . Scoliosis     Past Surgical History:  Procedure Laterality Date  . ABDOMINAL HYSTERECTOMY    . APPENDECTOMY    . BACK SURGERY    . BIOPSY  03/03/2017   Performed by Danie Binder, MD at [place]  .  BREAST SURGERY    . CARDIAC CATHETERIZATION  12/08/2005   LAD AND LEFT MAIN WITH NO HIGH-GRADE STENOSIS. MILD DISEASE IN THE CX AND LAD SYSTEM. SEVERE LV DYSFUNCTION WITH DILATION OF THE LV. EF 15-20%. LV END-DIASTOLIC PRESSURE IS 90. +1 MR.  . CHOLECYSTECTOMY    . COLONOSCOPY N/A 03/03/2017   Performed by Danie Binder, MD at [place]  . CYSTOSCOPY WITH URETHRAL DILITATION N/A 02/24/2013   Performed by Beaulah Dinning I, MD at [facility]  . DOPPLER ECHOCARDIOGRAPHY N/A 05/30/2010   LV SIZE IS NORMAL. LV SYSTOLIC FUNCTION IS LOW NORMAL. EF=50-55%. MILD INFERIOR HYPOKINESIS.MILD TO MODERATE POSTERIOR WALL HYPOKINESIS.PACEMAKER LEAD IN THE RV. LA IS MILDLY DILATED. RA IS MODERATE TO SEVERLY DILATED. PACEMAKER LEAD IN THE RA. MILD CALCICICATION OF THE MV APPARATUS. MODERATE MR. MILD TO MODERATE TR. MILD PHTN.AV MILDLY SCLEROTIC.  [name] ESOPHAGOGASTRODUODENOSCOPY (EGD) N/A 03/03/2017   Performed by Danie Binder, MD at [place]  . ESOPHAGOGASTRODUODENOSCOPY (EGD) N/A 10/13/2012   Performed by Daneil Dolin, MD at [place]  . HERNIA REPAIR     right inguinal hernia and umbilical  . LOWETR EXT VENOUS Bilateral 11-08-10   R & L- NO EVIDENCE OF THROMBUS OR THROMBOPHLEBITIS. THERE IS MILD AMOUNT  OF SUBCUTANEOUS EDEMA NOTED WITHIN THE LEFT CALF AND ANKLE. R & L GSV AND SSV- NO VENOUS INSUFF NOTED.  [name] NECK SURGERY    . NUCLEAR STRESS TEST N/A 02/13/2009   NORMAL PATTERN OF PERFUSION IN ALL REGIONS. POST STRESS VENTICULAR SIZE IS NORMAL. POST  STESS EF 85%.  NORMAL MYOCARDIAL PERFUSION STUDY.  [name] PACEMAKER INSERTION    . POLYPECTOMY  03/03/2017   Performed by Danie Binder, MD at [place]  . TONSILLECTOMY    . YAG LASER APPLICATION Bilateral 0/62/3762   Performed by Williams Che, MD at [facility]     reports that she quit smoking about 12 years ago. Her smoking use included cigarettes. She has a 15.00 pack-year smoking history. she has never used smokeless tobacco. She reports that  she drinks alcohol. She reports that she does not use drugs.  Allergies  Allergen Reactions  . Ciprofloxacin Other (See Comments)    Possibly caused diarrhea November 2018  . Flagyl [Metronidazole] Other (See Comments)    Possibly caused diarrhea November 2018  . Penicillins Hives    Has patient had a PCN reaction causing immediate rash, facial/tongue/throat swelling, SOB or lightheadedness with hypotension: Yes Has patient had a PCN reaction causing severe rash involving mucus membranes or skin necrosis: No Has patient had a PCN reaction that required hospitalization No Has patient had a PCN reaction occurring within the last 10 years: no If all of the above answers are "NO", then may proceed with Cephalosporin use.   . Sulfa Antibiotics Rash    Family History  Problem Relation Age of Onset  . Cancer Sister   . Asthma Sister   . Heart failure Brother   . Pulmonary embolism Brother   . Cancer Brother   . Colon cancer Neg Hx     Prior to Admission medications   Medication Sig Start Date End Date Taking? Authorizing Provider  acetaminophen (TYLENOL) 500 MG tablet Take 1,000 mg every 6 (six) hours as needed by mouth for headache (pain).    Yes [provider]  carvedilol (COREG) 6.25 MG tablet TAKE 1 TABLET BY MOUTH TWO  TIMES DAILY WITH A MEAL Patient taking differently: Take 6.25 mg 2 (two) times daily with a meal by mouth.  01/13/17  Yes Croitoru, Mihai, MD  ELIQUIS 2.5 MG TABS tablet TAKE 1 TABLET BY MOUTH  TWICE A DAY Patient taking differently: TAKE 1 TABLET (2.5 MG) BY MOUTH  TWICE A DAY 09/30/16  Yes Croitoru, Mihai, MD  furosemide (LASIX) 20 MG tablet Take 1 tablet (20 mg total) by mouth daily. 04/07/16  Yes Croitoru, Mihai, MD  loperamide (IMODIUM A-D) 2 MG tablet Take 2 mg 4 (four) times daily as needed by mouth for diarrhea or loose stools.   Yes [provider]  Multiple Vitamins-Minerals (PRESERVISION AREDS 2) CAPS Take 1 capsule daily by mouth.   Yes  [provider]  pantoprazole (PROTONIX) 40 MG tablet Take 40 mg daily by mouth.   Yes [provider]  Polyethyl Glycol-Propyl Glycol (SYSTANE ULTRA) 0.4-0.3 % SOLN Place 1-2 drops 2 (two) times daily as needed into both eyes (dry eyes).    Yes [provider]  Probiotic Product (PROBIOTIC COLON SUPPORT) CAPS Take 1 capsule 2 (two) times daily with a meal by mouth.   Yes [provider]  sodium chloride (OCEAN) 0.65 % SOLN nasal spray Place 1 spray 2 (two) times daily as needed into both nostrils for congestion.  Yes [provider]  traZODone (DESYREL) 50 MG tablet Take 0.5-2 tablets (25-100 mg total) at bedtime as needed by mouth for sleep. Patient taking differently: Take 100 mg at bedtime by mouth.  03/13/17  Yes Dettinger, Fransisca Kaufmann, MD  ciprofloxacin (CIPRO) 250 MG tablet Take 1 tablet (250 mg total) 2 (two) times daily by mouth. Patient not taking: Reported on 03/19/2017 03/12/17   Dettinger, Fransisca Kaufmann, MD  metroNIDAZOLE (FLAGYL) 500 MG tablet Take 1 tablet (500 mg total) 2 (two) times daily by mouth. Patient not taking: Reported on 03/19/2017 03/12/17   Dettinger, Fransisca Kaufmann, MD  omeprazole (PRILOSEC) 20 MG capsule Take 1 capsule (20 mg total) 2 (two) times daily before a meal by mouth. Patient not taking: Reported on 03/21/2017 03/12/17   Dettinger, Fransisca Kaufmann, MD    Physical Exam: Vitals:   03/21/17 1800 03/21/17 1900 03/21/17 1930 03/21/17 2000  BP: 122/63 (!) 147/66 (!) 147/64 137/73  Pulse: 70 71 73 70  Resp:      Temp:      TempSrc:      SpO2: 100% 100% 100% 100%  Weight:      Height:          Constitutional: NAD, calm, comfortable Vitals:   03/21/17 1800 03/21/17 1900 03/21/17 1930 03/21/17 2000  BP: 122/63 (!) 147/66 (!) 147/64 137/73  Pulse: 70 71 73 70  Resp:      Temp:      TempSrc:      SpO2: 100% 100% 100% 100%  Weight:      Height:       Eyes: PERRL, lids and conjunctivae normal ENMT: Mucous membranes are moist.  Posterior pharynx clear of any exudate or lesions.Normal dentition.  Neck: normal, supple, no masses, no thyromegaly Respiratory: clear to auscultation bilaterally, no wheezing, no crackles. Normal respiratory effort. No accessory muscle use.  Cardiovascular: Regular rate and rhythm, no murmurs / rubs / gallops. No extremity edema. 2+ pedal pulses. No carotid bruits.  Abdomen: no tenderness, no masses palpated. No hepatosplenomegaly. Bowel sounds positive.  Musculoskeletal: no clubbing / cyanosis. No joint deformity upper and lower extremities. Good ROM, no contractures. Normal muscle tone.  Skin: no rashes, lesions, ulcers. No induration Neurologic: CN 2-12 grossly intact. Sensation intact, DTR normal. Strength 5/5 in all 4.  Psychiatric: Normal judgment and insight. Alert and oriented x 3. Normal mood.    Labs on Admission: I have personally reviewed following labs and imaging studies  CBC: Recent Labs  Lab 03/20/17 1158 03/21/17 1817  WBC 5.7 5.8  NEUTROABS  --  3.6  HGB 12.2 10.3*  HCT 35.5* 30.9*  MCV 101.1* 100.3*  PLT 196 323   Basic Metabolic Panel: Recent Labs  Lab 03/20/17 1158 03/21/17 1817  NA 137 136  K 3.3* 4.0  CL 104 108  CO2 23 20*  GLUCOSE 109* 109*  BUN 16 17  CREATININE 1.37* 1.76*  CALCIUM 9.1 8.2*   GFR: Estimated Creatinine Clearance: 20.3 mL/min (A) (by C-G formula based on SCr of 1.76 mg/dL (H)). Liver Function Tests: Recent Labs  Lab 03/20/17 1158 03/21/17 1817  AST 27 30  ALT 13* 9*  ALKPHOS 48 41  BILITOT 0.7 0.9  PROT 6.8 6.0*  ALBUMIN 4.0 3.1*   Recent Labs  Lab 03/20/17 1158  LIPASE 34   No results for input(s): AMMONIA in the last 168 hours. Coagulation Profile: No results for input(s): INR, PROTIME in the last 168 hours. Cardiac Enzymes: No results for  input(s): CKTOTAL, CKMB, CKMBINDEX, TROPONINI in the last 168 hours. BNP (last 3 results) No results for input(s): PROBNP in the last 8760 hours. HbA1C: No results for  input(s): HGBA1C in the last 72 hours. CBG: No results for input(s): GLUCAP in the last 168 hours. Lipid Profile: No results for input(s): CHOL, HDL, LDLCALC, TRIG, CHOLHDL, LDLDIRECT in the last 72 hours. Thyroid Function Tests: No results for input(s): TSH, T4TOTAL, FREET4, T3FREE, THYROIDAB in the last 72 hours. Anemia Panel: No results for input(s): VITAMINB12, FOLATE, FERRITIN, TIBC, IRON, RETICCTPCT in the last 72 hours. Urine analysis:    Component Value Date/Time   COLORURINE STRAW (A) 02/28/2017 1952   APPEARANCEUR HAZY (A) 02/28/2017 1952   LABSPEC 1.006 02/28/2017 1952   PHURINE 6.0 02/28/2017 1952   GLUCOSEU NEGATIVE 02/28/2017 1952   HGBUR NEGATIVE 02/28/2017 [place] NEGATIVE 02/28/2017 1952   BILIRUBINUR neg 04/25/2014 [place] 02/28/2017 1952   PROTEINUR NEGATIVE 02/28/2017 1952   UROBILINOGEN 0.2 05/26/2014 1415   NITRITE NEGATIVE 02/28/2017 1952   LEUKOCYTESUR SMALL (A) 02/28/2017 1952   Sepsis Labs: !!!!!!!!!!!!!!!!!!!!!!!!!!!!!!!!!!!!!!!!!!!! @LABRCNTIP (procalcitonin:4,lacticidven:4) )No results found for this or any previous visit (from the past 240 hour(s)).   Radiological Exams on Admission: Ct Abdomen Pelvis W Contrast  Result Date: 03/20/2017 CLINICAL DATA:  Severe lower abdominal pain. Blood in stools today. Colonoscopy 2 weeks ago. Diarrhea for the past 2 days. EXAM: CT ABDOMEN AND PELVIS WITH CONTRAST TECHNIQUE: Multidetector CT imaging of the abdomen and pelvis was performed using the standard protocol following bolus administration of intravenous contrast. CONTRAST:  57mL ISOVUE-300 IOPAMIDOL (ISOVUE-300) INJECTION 61% COMPARISON:  03/09/2017. FINDINGS: Lower chest: A moderate-sized hiatal hernia is again demonstrated. Intracardiac pacer and AICD leads. No acute abnormality. Hepatobiliary: Multiple liver cysts are again demonstrated. Cholecystectomy clips. Pancreas: Unremarkable. No pancreatic ductal dilatation or surrounding  inflammatory changes. Spleen: Normal in size without focal abnormality. Adrenals/Urinary Tract: Adrenal glands are unremarkable. Kidneys are normal, without renal calculi, focal lesion, or hydronephrosis. Bladder is unremarkable. Stomach/Bowel: Multiple sigmoid colon diverticula are again demonstrated. Moderate-sized hiatal hernia. Unremarkable small bowel. No evidence of appendicitis. Vascular/Lymphatic: Atheromatous arterial calcifications without aneurysm. No enlarged lymph nodes. Reproductive: Status post hysterectomy. No adnexal masses. Other: Previously noted small umbilical hernia containing fat. Musculoskeletal: Stable marked lumbar spine degenerative changes, marked dextroconvex scoliosis and old T12 vertebral compression deformity. IMPRESSION: 1. No acute abnormality. 2. Stable moderate-sized hiatal hernia. 3. Sigmoid diverticulosis. Electronically Signed   By: Claudie Revering M.D.   On: 03/20/2017 14:12    Old chart reviewed Case discussed with edp   Assessment/Plan 83 yo female with lower bilateral abdominal pain and diarrhea for 6 weeks  Principal Problem:   Anemia- ck anemia panel.  Repeat cbc in am.  Not sure if actual gib or not.  Heme stool.  Hold eloquis for now  Active Problems:   Lower abdominal pain- ck ua.  Has had extensive recent work up with no cause.  Gi suggested amitiza but has not tried this yet.  abd exam benign.  Ck lactic, was some concern of underlying ischemia but symptoms or scope not c/w this.     Permanent atrial fibrillation (HCC)- stable   Biventricular cardiac pacemaker - [employer] Consulta- stable   Fibromyalgia- noted   Essential hypertension- cont home meds   CKD (chronic kidney disease) stage 3, GFR 30-59 ml/min (HCC)- stable   Chronic anticoagulation- holding eloquis for now due to above   Chronic diastolic heart failure ([place])- stable, cont  home lasix dosing   History of bipolar disorder- noted   PUD (peptic ulcer disease)- cont protonix    DVT  prophylaxis:  scds Code Status:  DNR Family Communication:  nione  Disposition Plan:  Per day team Consults called:  none Admission status:  observation   Nasif Bos A MD [facility]  If 7PM-7AM, please contact night-coverage [email or web address] Password [email or web address]  03/21/2017, 8:22 PM

## 2017-03-21 NOTE — ED Notes (Signed)
EDP at bedside  

## 2017-03-21 NOTE — ED Notes (Signed)
Attempted to call report

## 2017-03-21 NOTE — ED Notes (Signed)
Delay in lab draw,  Pt not in room 

## 2017-03-21 NOTE — ED Provider Notes (Signed)
[facility] EMERGENCY DEPARTMENT Provider Note   CSN: [id number] Arrival date & time: 03/21/17  1314     History   Chief Complaint Chief Complaint  Patient presents with  . Abdominal Pain    HPI Kimberly [name] is a 83 y.o. female.  HPI  Patient is an 83 year old female with past medical history significant for A. fib, arrhythmia, CHF, diverticulitis, anemia, fibromyalgia, chronic kidney disease.  She is presenting with abdominal pain.  Patient has been seen multiple times for the same complaint in the last 4 weeks..  In fact patient had admission 2 weeks ago for abdominal pain.  At that point she had an EGD and a colonoscopy done.  Both showed no evidence for reason for acute abdominal pain.  Patient saw in the emergency department since then.  And additionally seen again yesterday for the same complaint in the emergency department.  Yesterday's visit included labs and a normal CAT scan.  Patient here with friend today.  Her story is difficult to ascertain.  She reports taking only partial bit of certain treatments that were given.  She says that she was given antibiotic by the GI people but she did not take all of it.  He says that she has not been taking her Lasix.  She keeps expressing certainty that "my abdominal pain cannot be from diarrhea".  That something "must be wrong".  She states "I wont go to that hospital again".  She also numerous times states that she is afraid that she might fall at home.  She is asking to be admitted to the hospital because she is afraid that she might fall at home.  Past Medical History:  Diagnosis Date  . Acute blood loss anemia 10/11/2012  . Acute diverticulitis 08/24/2013  . Acute on chronic combined systolic and diastolic CHF, NYHA class 4 ([place]) 11/15/2013  . Antral ulcer 10/11/2012  . Arrhythmia    atrial fibb  . Atrial fibrillation ([place])   . Cardiomyopathy, nonischemic ([place])   . Chronic anticoagulation 10/12/2012  . CKD (chronic  kidney disease) stage 3, GFR 30-59 ml/min (HCC) 10/12/2012  . Depression   . Erosive esophagitis 10/11/2012  . Fibromyalgia   . Glaucoma   . H/O echocardiogram 2007   EF 40-45%,         . Hypertension   . Osteoarthritis   . Pacemaker    Last saw cards 07/2013  . Scoliosis     Patient Active Problem List   Diagnosis Date Noted  . Lower abdominal pain   . Lower GI bleed   . Hematochezia 03/01/2017  . Glaucoma 02/28/2017  . Pacemaker 03/21/2015  . Non-ischemic cardiomyopathy ([place]) 11/28/2014  . PUD (peptic ulcer disease) 06/14/2014  . History of bipolar disorder   . Hypokalemia 05/26/2014  . Fall at home 05/26/2014  . Chronic diastolic heart failure ([place]) 11/15/2013  . Complete heart block ([place]) 11/15/2013  . Inability to ambulate due to ankle or foot 09/05/2013  . Unspecified constipation 08/26/2013  . Anemia 08/25/2013  . CKD (chronic kidney disease) stage 3, GFR 30-59 ml/min (HCC) 10/12/2012  . Chronic anticoagulation 10/12/2012  . Osteoarthritis   . Permanent atrial fibrillation ([place])   . Biventricular cardiac pacemaker - [employer] Consulta   . Fibromyalgia   . Essential hypertension   . Depression     Past Surgical History:  Procedure Laterality Date  . ABDOMINAL HYSTERECTOMY    . APPENDECTOMY    . BACK SURGERY    .  BIOPSY  03/03/2017   Performed by Danie Binder, MD at [facility]  . BREAST SURGERY    . CARDIAC CATHETERIZATION  12/08/2005   LAD AND LEFT MAIN WITH NO HIGH-GRADE STENOSIS. MILD DISEASE IN THE CX AND LAD SYSTEM. SEVERE LV DYSFUNCTION WITH DILATION OF THE LV. EF 15-20%. LV END-DIASTOLIC PRESSURE IS 90. +1 MR.  . CHOLECYSTECTOMY    . COLONOSCOPY N/A 03/03/2017   Performed by Danie Binder, MD at [facility] [place]  . CYSTOSCOPY WITH URETHRAL DILITATION N/A 02/24/2013   Performed by Beaulah Dinning I, MD at [facility]  . DOPPLER ECHOCARDIOGRAPHY N/A 05/30/2010   LV SIZE IS NORMAL. LV SYSTOLIC FUNCTION IS LOW NORMAL. EF=50-55%. MILD INFERIOR  HYPOKINESIS.MILD TO MODERATE POSTERIOR WALL HYPOKINESIS.PACEMAKER LEAD IN THE RV. LA IS MILDLY DILATED. RA IS MODERATE TO SEVERLY DILATED. PACEMAKER LEAD IN THE RA. MILD CALCICICATION OF THE MV APPARATUS. MODERATE MR. MILD TO MODERATE TR. MILD PHTN.AV MILDLY SCLEROTIC.  [name] ESOPHAGOGASTRODUODENOSCOPY (EGD) N/A 03/03/2017   Performed by Danie Binder, MD at [facility]  . ESOPHAGOGASTRODUODENOSCOPY (EGD) N/A 10/13/2012   Performed by Daneil Dolin, MD at [facility]  . HERNIA REPAIR     right inguinal hernia and umbilical  . LOWETR EXT VENOUS Bilateral 11-08-10   R & L- NO EVIDENCE OF THROMBUS OR THROMBOPHLEBITIS. THERE IS MILD AMOUNT OF SUBCUTANEOUS EDEMA NOTED WITHIN THE LEFT CALF AND ANKLE. R & L GSV AND SSV- NO VENOUS INSUFF NOTED.  [name] NECK SURGERY    . NUCLEAR STRESS TEST N/A 02/13/2009   NORMAL PATTERN OF PERFUSION IN ALL REGIONS. POST STRESS VENTICULAR SIZE IS NORMAL. POST  STESS EF 85%.  NORMAL MYOCARDIAL PERFUSION STUDY.  [name] PACEMAKER INSERTION    . POLYPECTOMY  03/03/2017   Performed by Danie Binder, MD at [facility]  . TONSILLECTOMY    . YAG LASER APPLICATION Bilateral 09/19/6158   Performed by Williams Che, MD at [facility]    OB History    Gravida Para Term Preterm AB Living   5 2 1 1 3 2    SAB TAB Ectopic Multiple Live Births   3               Home Medications    Prior to Admission medications   Medication Sig Start Date End Date Taking? Authorizing Provider  acetaminophen (TYLENOL) 500 MG tablet Take 500 mg every 6 (six) hours as needed by mouth for headache.     [provider]  carvedilol (COREG) 6.25 MG tablet TAKE 1 TABLET BY MOUTH TWO  TIMES DAILY WITH A MEAL 01/13/17   Croitoru, Mihai, MD  ciprofloxacin (CIPRO) 250 MG tablet Take 1 tablet (250 mg total) 2 (two) times daily by mouth. Patient not taking: Reported on 03/19/2017 03/12/17   Dettinger, Fransisca Kaufmann, MD  ELIQUIS 2.5 MG TABS tablet TAKE 1 TABLET BY MOUTH  TWICE A DAY 09/30/16   Croitoru,  Mihai, MD  furosemide (LASIX) 20 MG tablet Take 1 tablet (20 mg total) by mouth daily. 04/07/16   Croitoru, Mihai, MD  metroNIDAZOLE (FLAGYL) 500 MG tablet Take 1 tablet (500 mg total) 2 (two) times daily by mouth. Patient not taking: Reported on 03/19/2017 03/12/17   Dettinger, Fransisca Kaufmann, MD  Multiple Vitamins-Minerals (ICAPS AREDS 2) CAPS Take 2 capsules by mouth daily.    [provider]  omeprazole (PRILOSEC) 20 MG capsule Take 1 capsule (20 mg total) 2 (two) times daily before a  meal by mouth. 03/12/17   Dettinger, Fransisca Kaufmann, MD  pantoprazole (PROTONIX) 40 MG tablet Take 40 mg daily by mouth.    [provider]  Polyethyl Glycol-Propyl Glycol (SYSTANE ULTRA) 0.4-0.3 % SOLN Place 1-2 drops into both eyes 2 (two) times daily as needed.    [provider]  sodium chloride (OCEAN) 0.65 % SOLN nasal spray Place 1 spray as needed into both nostrils for congestion.    [provider]  traZODone (DESYREL) 50 MG tablet Take 0.5-2 tablets (25-100 mg total) at bedtime as needed by mouth for sleep. 03/13/17   Dettinger, Fransisca Kaufmann, MD    Family History Family History  Problem Relation Age of Onset  . Cancer Sister   . Asthma Sister   . Heart failure Brother   . Pulmonary embolism Brother   . Cancer Brother   . Colon cancer Neg Hx     Social History Social History   Tobacco Use  . Smoking status: Former Smoker    Packs/day: 0.50    Years: 30.00    Pack years: 15.00    Types: Cigarettes    Last attempt to quit: 12/13/2004    Years since quitting: 12.2  . Smokeless tobacco: Never Used  . Tobacco comment: former smoker  Substance Use Topics  . Alcohol use: Yes    Alcohol/week: 0.0 oz    Comment: history of drinking a 1/2 glass of wine in the evening  . Drug use: No     Allergies   Penicillins and Sulfa antibiotics   Review of Systems Review of Systems  Constitutional: Positive for fatigue. Negative for activity change and fever.  HENT: Negative for  congestion.   Respiratory: Negative for shortness of breath.   Cardiovascular: Negative for chest pain.  Gastrointestinal: Positive for abdominal pain and diarrhea.  Neurological: Negative for dizziness, weakness and numbness.  All other systems reviewed and are negative.    Physical Exam Updated Vital Signs BP (!) 146/55   Pulse 81   Temp 98 F (36.7 C) (Oral)   Resp 18   Ht 5' 5.5" (1.664 m)   Wt 65.8 kg (145 lb)   SpO2 97%   BMI 23.76 kg/m   Physical Exam  Constitutional: She is oriented to person, place, and time. She appears well-developed and well-nourished.  Elderly female  HENT:  Head: Normocephalic and atraumatic.  Eyes: Right eye exhibits no discharge.  Cardiovascular: Normal rate, regular rhythm and normal heart sounds.  No murmur heard. Pulmonary/Chest: Effort normal and breath sounds normal. She has no wheezes. She has no rales.  Abdominal: Soft. Bowel sounds are normal. She exhibits no distension. There is no tenderness.  Neurological: She is oriented to person, place, and time.  Skin: Skin is warm and dry. She is not diaphoretic.  Psychiatric: She has a normal mood and affect.  Nursing note and vitals reviewed.    ED Treatments / Results  Labs (all labs ordered are listed, but only abnormal results are displayed) Labs Reviewed  GASTROINTESTINAL PANEL BY PCR, STOOL (REPLACES STOOL CULTURE)  C DIFFICILE QUICK SCREEN W PCR REFLEX  CBC WITH DIFFERENTIAL/PLATELET  COMPREHENSIVE METABOLIC PANEL    EKG  EKG Interpretation None       Radiology Ct Abdomen Pelvis W Contrast  Result Date: 03/20/2017 CLINICAL DATA:  Severe lower abdominal pain. Blood in stools today. Colonoscopy 2 weeks ago. Diarrhea for the past 2 days. EXAM: CT ABDOMEN AND PELVIS WITH CONTRAST TECHNIQUE: Multidetector CT imaging of the  abdomen and pelvis was performed using the standard protocol following bolus administration of intravenous contrast. CONTRAST:  21mL ISOVUE-300  IOPAMIDOL (ISOVUE-300) INJECTION 61% COMPARISON:  03/09/2017. FINDINGS: Lower chest: A moderate-sized hiatal hernia is again demonstrated. Intracardiac pacer and AICD leads. No acute abnormality. Hepatobiliary: Multiple liver cysts are again demonstrated. Cholecystectomy clips. Pancreas: Unremarkable. No pancreatic ductal dilatation or surrounding inflammatory changes. Spleen: Normal in size without focal abnormality. Adrenals/Urinary Tract: Adrenal glands are unremarkable. Kidneys are normal, without renal calculi, focal lesion, or hydronephrosis. Bladder is unremarkable. Stomach/Bowel: Multiple sigmoid colon diverticula are again demonstrated. Moderate-sized hiatal hernia. Unremarkable small bowel. No evidence of appendicitis. Vascular/Lymphatic: Atheromatous arterial calcifications without aneurysm. No enlarged lymph nodes. Reproductive: Status post hysterectomy. No adnexal masses. Other: Previously noted small umbilical hernia containing fat. Musculoskeletal: Stable marked lumbar spine degenerative changes, marked dextroconvex scoliosis and old T12 vertebral compression deformity. IMPRESSION: 1. No acute abnormality. 2. Stable moderate-sized hiatal hernia. 3. Sigmoid diverticulosis. Electronically Signed   By: Claudie Revering M.D.   On: 03/20/2017 14:12    Procedures Procedures (including critical care time)  Medications Ordered in ED Medications  furosemide (LASIX) injection 40 mg (not administered)     Initial Impression / Assessment and Plan / ED Course  I have reviewed the triage vital signs and the nursing notes.  Pertinent labs & imaging results that were available during my care of the patient were reviewed by me and considered in my medical decision making (see chart for details).     Patient is an 83 year old female with past medical history significant for A. fib, arrhythmia, CHF, diverticulitis, anemia, fibromyalgia, chronic kidney disease.  She is presenting with abdominal pain.   Patient has been seen multiple times for the same complaint in the last 4 weeks..  In fact patient had admission 2 weeks ago for abdominal pain.  At that point she had an EGD and a colonoscopy done.  Both showed no evidence for reason for acute abdominal pain.  Patient saw in the emergency department since then.  And additionally seen again yesterday for the same complaint in the emergency department.  Yesterday's visit included labs and a normal CAT scan.  Patient here with friend today.  Her story is difficult to ascertain.  She reports taking only partial bit of certain treatments that were given.  She says that she was given antibiotic by the GI people but she did not take all of it.  He says that she has not been taking her Lasix.  She keeps expressing certainty that "my abdominal pain cannot be from diarrhea".  That something "must be wrong".  She states "I wont go to that hospital again".  She also numerous times states that she is afraid that she might fall at home.  She is asking to be admitted to the hospital because she is afraid that she might fall at home.  5:54 PM This difficult because patient describes nonspecific pain.  She says she has "diarrhea" but then states "my stools are soft at all".  So it is very confusing.  Given that the story is difficult to ascertain.  I would like to base medical decision making based on labs and imaging.  Patient's vital signs labs and imaging have all been normal.  She is a normal EGD and colonoscopy within the last 3 weeks.  We will give her some IV Lasix because she skipped her home Lasix.  We will run basic labs.  But ultimately we will need patient to follow-up  with GI as an outpatient.  I cannot think of any further imaging that we can do in the emergency department or justify admission at this time.  7:52 PM Patient had two-point hemoglobin drop since labs were drawn yesterday.  Is concerning for concern of GI bleeding.  Patient had over 6 bowel  movements since being in her room here in the emergency department.  Will admit for continued observation, and GI follow-up in the morning.  Stool sent for cdiff and other stool studies.  Final Clinical Impressions(s) / ED Diagnoses   Final diagnoses:  None    ED Discharge Orders    None       Macarthur Critchley, MD 03/21/17 1952

## 2017-03-21 NOTE — ED Triage Notes (Signed)
Pt went to [facility] yesterday and had blood drawn

## 2017-03-21 NOTE — ED Notes (Signed)
[place] MD at bedside

## 2017-03-22 ENCOUNTER — Other Ambulatory Visit: Payer: Self-pay

## 2017-03-22 DIAGNOSIS — I10 Essential (primary) hypertension: Secondary | ICD-10-CM | POA: Diagnosis not present

## 2017-03-22 DIAGNOSIS — Z950 Presence of cardiac pacemaker: Secondary | ICD-10-CM | POA: Diagnosis not present

## 2017-03-22 DIAGNOSIS — M797 Fibromyalgia: Secondary | ICD-10-CM | POA: Diagnosis not present

## 2017-03-22 DIAGNOSIS — R442 Other hallucinations: Secondary | ICD-10-CM | POA: Diagnosis not present

## 2017-03-22 DIAGNOSIS — N183 Chronic kidney disease, stage 3 (moderate): Secondary | ICD-10-CM | POA: Diagnosis not present

## 2017-03-22 DIAGNOSIS — R1030 Lower abdominal pain, unspecified: Secondary | ICD-10-CM

## 2017-03-22 DIAGNOSIS — Z7901 Long term (current) use of anticoagulants: Secondary | ICD-10-CM | POA: Diagnosis not present

## 2017-03-22 DIAGNOSIS — I5032 Chronic diastolic (congestive) heart failure: Secondary | ICD-10-CM | POA: Diagnosis not present

## 2017-03-22 LAB — CBC
HCT: 30.6 % — ABNORMAL LOW (ref 36.0–46.0)
Hemoglobin: 10.2 g/dL — ABNORMAL LOW (ref 12.0–15.0)
MCH: 33.2 pg (ref 26.0–34.0)
MCHC: 33.3 g/dL (ref 30.0–36.0)
MCV: 99.7 fL (ref 78.0–100.0)
PLATELETS: 177 10*3/uL (ref 150–400)
RBC: 3.07 MIL/uL — ABNORMAL LOW (ref 3.87–5.11)
RDW: 14.8 % (ref 11.5–15.5)
WBC: 5.5 10*3/uL (ref 4.0–10.5)

## 2017-03-22 LAB — BASIC METABOLIC PANEL
Anion gap: 10 (ref 5–15)
BUN: 14 mg/dL (ref 6–20)
CO2: 21 mmol/L — ABNORMAL LOW (ref 22–32)
CREATININE: 1.58 mg/dL — AB (ref 0.44–1.00)
Calcium: 8.4 mg/dL — ABNORMAL LOW (ref 8.9–10.3)
Chloride: 105 mmol/L (ref 101–111)
GFR calc Af Amer: 33 mL/min — ABNORMAL LOW (ref >60)
GFR, EST NON AFRICAN AMERICAN: 28 mL/min — AB (ref >60)
GLUCOSE: 113 mg/dL — AB (ref 65–99)
Potassium: 3.1 mmol/L — ABNORMAL LOW (ref 3.5–5.1)
Sodium: 136 mmol/L (ref 135–145)

## 2017-03-22 LAB — GASTROINTESTINAL PANEL BY PCR, STOOL (REPLACES STOOL CULTURE)
Adenovirus F40/41: NOT DETECTED
Astrovirus: NOT DETECTED
CAMPYLOBACTER SPECIES: NOT DETECTED
Cryptosporidium: NOT DETECTED
Cyclospora cayetanensis: NOT DETECTED
ENTEROAGGREGATIVE E COLI (EAEC): NOT DETECTED
Entamoeba histolytica: NOT DETECTED
Enteropathogenic E coli (EPEC): DETECTED — AB
Enterotoxigenic E coli (ETEC): NOT DETECTED
GIARDIA LAMBLIA: NOT DETECTED
NOROVIRUS GI/GII: NOT DETECTED
PLESIMONAS SHIGELLOIDES: NOT DETECTED
ROTAVIRUS A: NOT DETECTED
SALMONELLA SPECIES: NOT DETECTED
SHIGA LIKE TOXIN PRODUCING E COLI (STEC): NOT DETECTED
SHIGELLA/ENTEROINVASIVE E COLI (EIEC): NOT DETECTED
Sapovirus (I, II, IV, and V): NOT DETECTED
Vibrio cholerae: NOT DETECTED
Vibrio species: NOT DETECTED
Yersinia enterocolitica: NOT DETECTED

## 2017-03-22 LAB — LACTIC ACID, PLASMA: LACTIC ACID, VENOUS: 1.9 mmol/L (ref 0.5–1.9)

## 2017-03-22 LAB — C DIFFICILE QUICK SCREEN W PCR REFLEX
C DIFFICILE (CDIFF) TOXIN: NEGATIVE
C DIFFICLE (CDIFF) ANTIGEN: NEGATIVE
C Diff interpretation: NOT DETECTED

## 2017-03-22 LAB — OCCULT BLOOD X 1 CARD TO LAB, STOOL
Fecal Occult Bld: NEGATIVE
Fecal Occult Bld: NEGATIVE

## 2017-03-22 LAB — ABO/RH: ABO/RH(D): A NEG

## 2017-03-22 LAB — MAGNESIUM: MAGNESIUM: 1.5 mg/dL — AB (ref 1.7–2.4)

## 2017-03-22 MED ORDER — PROSIGHT PO TABS
1.0000 | ORAL_TABLET | Freq: Two times a day (BID) | ORAL | Status: DC
  Administered 2017-03-22 – 2017-03-24 (×6): 1 via ORAL
  Filled 2017-03-22 (×6): qty 1

## 2017-03-22 MED ORDER — RIFAMPIN 300 MG PO CAPS
300.0000 mg | ORAL_CAPSULE | Freq: Two times a day (BID) | ORAL | Status: DC
  Filled 2017-03-22: qty 1

## 2017-03-22 MED ORDER — AZITHROMYCIN 500 MG PO TABS
500.0000 mg | ORAL_TABLET | Freq: Every day | ORAL | Status: AC
  Administered 2017-03-22 – 2017-03-24 (×3): 500 mg via ORAL
  Filled 2017-03-22 (×3): qty 1

## 2017-03-22 MED ORDER — POTASSIUM CHLORIDE CRYS ER 20 MEQ PO TBCR
40.0000 meq | EXTENDED_RELEASE_TABLET | Freq: Once | ORAL | Status: AC
  Administered 2017-03-22: 40 meq via ORAL
  Filled 2017-03-22: qty 2

## 2017-03-22 MED ORDER — LUBIPROSTONE 24 MCG PO CAPS
24.0000 ug | ORAL_CAPSULE | Freq: Two times a day (BID) | ORAL | Status: DC
  Administered 2017-03-22 – 2017-03-24 (×4): 24 ug via ORAL
  Filled 2017-03-22 (×4): qty 1

## 2017-03-22 MED ORDER — TRAMADOL HCL 50 MG PO TABS
50.0000 mg | ORAL_TABLET | Freq: Two times a day (BID) | ORAL | Status: DC | PRN
  Administered 2017-03-22 – 2017-03-24 (×4): 50 mg via ORAL
  Filled 2017-03-22 (×4): qty 1

## 2017-03-22 MED ORDER — POTASSIUM CHLORIDE CRYS ER 20 MEQ PO TBCR
20.0000 meq | EXTENDED_RELEASE_TABLET | Freq: Two times a day (BID) | ORAL | Status: DC
  Administered 2017-03-22 – 2017-03-23 (×2): 20 meq via ORAL
  Filled 2017-03-22 (×2): qty 1

## 2017-03-22 MED ORDER — TRAMADOL HCL 50 MG PO TABS
50.0000 mg | ORAL_TABLET | Freq: Once | ORAL | Status: AC
  Administered 2017-03-22: 50 mg via ORAL
  Filled 2017-03-22: qty 1

## 2017-03-22 MED ORDER — RIFAMPIN 300 MG PO CAPS: 300.0000 mg | ORAL_CAPSULE | Freq: Two times a day (BID) | ORAL | Status: DC

## 2017-03-22 NOTE — Progress Notes (Signed)
Patient admitted to room 5W32 in stable condition from ED, accompanied by ED tech.  Patient oriented to room, phone and call bell placed in patient's reach and patient voiced understanding of use of each.  RN performed assessment on patient, orders reviewed.  Patient had 1 formed stool upon admission and no bleeding noted.  RN will continue to monitor patient and notify MD with any issues.  P.J. Linus Mako, RN

## 2017-03-22 NOTE — Progress Notes (Signed)
Progress Note    Kimberly [name]  OBS:[id number] DOB: Dec 12, 1926  DOA: 03/21/2017 PCP: Dettinger, Fransisca Kaufmann, MD    Brief Narrative:   Chief complaint: Follow-up abdominal pain  Medical records reviewed and are as summarized below:  Kimberly [name] is an 83 y.o. female with a PMH of fibromyalgia, PUD, stage III CKD, permanent atrial fibrillation s/p AV nodal ablation secondary to complete heart block s/p biventricular pacemaker, nonischemic cardiomyopathy/chronic diastolic CHF with last echo showing an EF of 60-65 percent 11/2014 was admitted 03/21/17 for evaluation of lower abdominal pain associated with dark stool and a drop in her hemoglobin.  Assessment/Plan:   Principal Problem:   Lower abdominal pain associated with possible lower GI bleeding, drop in hemoglobin, in the setting of chronic anticoagulation/anemia Blood thinners held. Fecal occult blood testing ordered, but no sample sent yet. Hemoglobin stable after initial 2 g drop. Patient has been extensively evaluated by GI in the past regarding her abdominal pain. Will try Amitiza.  Active Problems:   Permanent atrial fibrillation [facility]) s/p Biventricular cardiac pacemaker - [employer] Consult Status post biventricular pacemaker.    Fibromyalgia Appears to have multiple somatic complaints including recurrent problems with GI discomfort, headache, etc. Recommend close outpatient follow-up with PCP.    Essential hypertension Continue Lasix.    CKD (chronic kidney disease) stage 3, GFR 30-59 ml/min (HCC) Baseline creatinine 1.27-1.41. Current creatinine 1.58.    Chronic anticoagulation Placed on hold while awaiting fecal occult blood testing.    Chronic diastolic heart failure (HCC) Currently compensated.    History of bipolar disorder    PUD (peptic ulcer disease) Continue PPI.    Hypokalemia Replace potassium. Check magnesium.  Body mass index is 23.01 kg/m.   Family Communication/Anticipated D/C date and  plan/Code Status   DVT prophylaxis: SCDs ordered. Code Status: DO NOT RESUSCITATE.  Family Communication: No family at bedside. Disposition Plan: Home fecal occult blood testing negative.   Medical Consultants:    None.   Anti-Infectives:    None  Subjective:   Complains of abdominal bloating and pain. Complains of headache. Has had 2 loose stools but denies looking at them to know if they were still dark. RN reports that they were brown in appearance.  Objective:    Vitals:   03/21/17 2200 03/21/17 2207 03/21/17 2322 03/22/17 0658  BP: (!) 129/50 (!) 139/56 (!) 158/57 121/60  Pulse: 81 72 71 75  Resp:    20  Temp:    97.9 F (36.6 C)  TempSrc:    Oral  SpO2:   100% 98%  Weight:      Height:        Intake/Output Summary (Last 24 hours) at 03/22/2017 1255 Last data filed at 03/22/2017 1254 Gross per 24 hour  Intake 270 ml  Output -  Net 270 ml   Filed Weights   03/21/17 1325 03/21/17 2159  Weight: 65.8 kg (145 lb) 68.6 kg (151 lb 5 oz)    Exam: General: No acute distress. Cardiovascular: Heart sounds show a regular rate, and rhythm. No gallops or rubs. No murmurs. No JVD. Lungs: Clear to auscultation bilaterally with good air movement. No rales, rhonchi or wheezes. Abdomen: Significant distention and diffuse tenderness. No masses. No hepatosplenomegaly. Neurological: Alert and oriented 3. Moves all extremities 4 with equal strength. Cranial nerves II through XII grossly intact. Skin: Warm and dry. No rashes or lesions. Extremities: No clubbing or cyanosis. 1+ edema. Pedal pulses 2+. Psychiatric: Mood and  affect are depressed/anxious. Insight and judgment are fair.   Data Reviewed:   I have personally reviewed following labs and imaging studies:  Labs: Labs show the following:   Basic Metabolic Panel: Recent Labs  Lab 03/20/17 1158 03/21/17 1817 03/22/17 0232  NA 137 136 136  K 3.3* 4.0 3.1*  CL 104 108 105  CO2 23 20* 21*  GLUCOSE 109*  109* 113*  BUN 16 17 14   CREATININE 1.37* 1.76* 1.58*  CALCIUM 9.1 8.2* 8.4*   GFR Estimated Creatinine Clearance: 24.8 mL/min (A) (by C-G formula based on SCr of 1.58 mg/dL (H)). Liver Function Tests: Recent Labs  Lab 03/20/17 1158 03/21/17 1817  AST 27 30  ALT 13* 9*  ALKPHOS 48 41  BILITOT 0.7 0.9  PROT 6.8 6.0*  ALBUMIN 4.0 3.1*   Recent Labs  Lab 03/20/17 1158  LIPASE 34   CBC: Recent Labs  Lab 03/20/17 1158 03/21/17 1817 03/22/17 0232  WBC 5.7 5.8 5.5  NEUTROABS  --  3.6  --   HGB 12.2 10.3* 10.2*  HCT 35.5* 30.9* 30.6*  MCV 101.1* 100.3* 99.7  PLT 196 187 177   Anemia work up: Recent Labs    03/21/17 2128  VITAMINB12 293  FOLATE 9.0  FERRITIN 162  TIBC 308  IRON 45  RETICCTPCT 1.8   Sepsis Labs: Recent Labs  Lab 03/20/17 1158 03/21/17 1817 03/21/17 2128 03/22/17 0026 03/22/17 0232  WBC 5.7 5.8  --   --  5.5  LATICACIDVEN  --   --  1.9 1.9  --     Microbiology Recent Results (from the past 240 hour(s))  C difficile quick scan w PCR reflex     Status: None   Collection Time: 03/21/17  6:44 PM  Result Value Ref Range Status   C Diff antigen NEGATIVE NEGATIVE Final   C Diff toxin NEGATIVE NEGATIVE Final   C Diff interpretation No C. difficile detected.  Final    Procedures and diagnostic studies:  Ct Abdomen Pelvis W Contrast  Result Date: 03/20/2017 CLINICAL DATA:  Severe lower abdominal pain. Blood in stools today. Colonoscopy 2 weeks ago. Diarrhea for the past 2 days. EXAM: CT ABDOMEN AND PELVIS WITH CONTRAST TECHNIQUE: Multidetector CT imaging of the abdomen and pelvis was performed using the standard protocol following bolus administration of intravenous contrast. CONTRAST:  73mL ISOVUE-300 IOPAMIDOL (ISOVUE-300) INJECTION 61% COMPARISON:  03/09/2017. FINDINGS: Lower chest: A moderate-sized hiatal hernia is again demonstrated. Intracardiac pacer and AICD leads. No acute abnormality. Hepatobiliary: Multiple liver cysts are again  demonstrated. Cholecystectomy clips. Pancreas: Unremarkable. No pancreatic ductal dilatation or surrounding inflammatory changes. Spleen: Normal in size without focal abnormality. Adrenals/Urinary Tract: Adrenal glands are unremarkable. Kidneys are normal, without renal calculi, focal lesion, or hydronephrosis. Bladder is unremarkable. Stomach/Bowel: Multiple sigmoid colon diverticula are again demonstrated. Moderate-sized hiatal hernia. Unremarkable small bowel. No evidence of appendicitis. Vascular/Lymphatic: Atheromatous arterial calcifications without aneurysm. No enlarged lymph nodes. Reproductive: Status post hysterectomy. No adnexal masses. Other: Previously noted small umbilical hernia containing fat. Musculoskeletal: Stable marked lumbar spine degenerative changes, marked dextroconvex scoliosis and old T12 vertebral compression deformity. IMPRESSION: 1. No acute abnormality. 2. Stable moderate-sized hiatal hernia. 3. Sigmoid diverticulosis. Electronically Signed   By: Claudie Revering M.D.   On: 03/20/2017 14:12    Medications:   . acidophilus  1 capsule Oral BID WC  . furosemide  20 mg Oral Daily  . multivitamin  1 tablet Oral BID  . pantoprazole  40 mg Oral Daily  .  sodium chloride flush  3 mL Intravenous Q12H   Continuous Infusions: . sodium chloride       LOS: 0 days   Jacquelynn Cree  [facility] Pager [phone]. If unable to reach me by pager, please call my cell phone at [phone].  *Please refer to [email or web address], password [email or web address] to get updated schedule on who will round on this patient, as hospitalists switch teams weekly. If 7PM-7AM, please contact night-coverage at [email or web address], password [email or web address] for any overnight needs.  03/22/2017, 12:55 PM

## 2017-03-22 NOTE — Progress Notes (Signed)
CRITICAL VALUE ALERT  Critical Value:  Stool - E.Coli  Date & Time Notied:  03/22/2017 - 17:20  Provider Notified: Dr. Rockne Menghini  Orders Received/Actions taken: see MAR

## 2017-03-23 ENCOUNTER — Encounter: Payer: Medicare Other | Admitting: *Deleted

## 2017-03-23 ENCOUNTER — Telehealth: Payer: Self-pay | Admitting: Cardiology

## 2017-03-23 DIAGNOSIS — F319 Bipolar disorder, unspecified: Secondary | ICD-10-CM | POA: Diagnosis present

## 2017-03-23 DIAGNOSIS — Z950 Presence of cardiac pacemaker: Secondary | ICD-10-CM | POA: Diagnosis not present

## 2017-03-23 DIAGNOSIS — I5032 Chronic diastolic (congestive) heart failure: Secondary | ICD-10-CM | POA: Diagnosis not present

## 2017-03-23 DIAGNOSIS — I482 Chronic atrial fibrillation: Secondary | ICD-10-CM

## 2017-03-23 DIAGNOSIS — R442 Other hallucinations: Secondary | ICD-10-CM | POA: Diagnosis present

## 2017-03-23 DIAGNOSIS — Z7901 Long term (current) use of anticoagulants: Secondary | ICD-10-CM | POA: Diagnosis not present

## 2017-03-23 DIAGNOSIS — F3164 Bipolar disorder, current episode mixed, severe, with psychotic features: Secondary | ICD-10-CM | POA: Diagnosis not present

## 2017-03-23 DIAGNOSIS — R1030 Lower abdominal pain, unspecified: Secondary | ICD-10-CM | POA: Diagnosis not present

## 2017-03-23 HISTORY — DX: Bipolar disorder, unspecified: F31.9

## 2017-03-23 LAB — BASIC METABOLIC PANEL
Anion gap: 8 (ref 5–15)
BUN: 14 mg/dL (ref 6–20)
CHLORIDE: 104 mmol/L (ref 101–111)
CO2: 21 mmol/L — AB (ref 22–32)
CREATININE: 1.41 mg/dL — AB (ref 0.44–1.00)
Calcium: 8.7 mg/dL — ABNORMAL LOW (ref 8.9–10.3)
GFR calc Af Amer: 37 mL/min — ABNORMAL LOW (ref >60)
GFR calc non Af Amer: 32 mL/min — ABNORMAL LOW (ref >60)
Glucose, Bld: 94 mg/dL (ref 65–99)
POTASSIUM: 4.4 mmol/L (ref 3.5–5.1)
Sodium: 133 mmol/L — ABNORMAL LOW (ref 135–145)

## 2017-03-23 LAB — CBC
HEMATOCRIT: 31.8 % — AB (ref 36.0–46.0)
Hemoglobin: 10.6 g/dL — ABNORMAL LOW (ref 12.0–15.0)
MCH: 33.4 pg (ref 26.0–34.0)
MCHC: 33.3 g/dL (ref 30.0–36.0)
MCV: 100.3 fL — AB (ref 78.0–100.0)
PLATELETS: 204 10*3/uL (ref 150–400)
RBC: 3.17 MIL/uL — ABNORMAL LOW (ref 3.87–5.11)
RDW: 14.8 % (ref 11.5–15.5)
WBC: 6.1 10*3/uL (ref 4.0–10.5)

## 2017-03-23 LAB — OCCULT BLOOD X 1 CARD TO LAB, STOOL: Fecal Occult Bld: NEGATIVE

## 2017-03-23 MED ORDER — MAGNESIUM SULFATE 2 GM/50ML IV SOLN
2.0000 g | Freq: Once | INTRAVENOUS | Status: AC
  Administered 2017-03-23: 2 g via INTRAVENOUS
  Filled 2017-03-23: qty 50

## 2017-03-23 MED ORDER — VITAMIN B-1 100 MG PO TABS
100.0000 mg | ORAL_TABLET | Freq: Every day | ORAL | Status: DC
  Administered 2017-03-23 – 2017-03-24 (×2): 100 mg via ORAL
  Filled 2017-03-23 (×2): qty 1

## 2017-03-23 MED ORDER — FOLIC ACID 1 MG PO TABS
1.0000 mg | ORAL_TABLET | Freq: Every day | ORAL | Status: DC
  Administered 2017-03-23 – 2017-03-24 (×2): 1 mg via ORAL
  Filled 2017-03-23 (×2): qty 1

## 2017-03-23 MED ORDER — ZOLPIDEM TARTRATE 5 MG PO TABS
5.0000 mg | ORAL_TABLET | Freq: Once | ORAL | Status: AC
  Administered 2017-03-23: 5 mg via ORAL
  Filled 2017-03-23: qty 1

## 2017-03-23 MED ORDER — FUROSEMIDE 10 MG/ML IJ SOLN
40.0000 mg | Freq: Once | INTRAMUSCULAR | Status: AC
  Administered 2017-03-23: 40 mg via INTRAVENOUS
  Filled 2017-03-23: qty 4

## 2017-03-23 MED ORDER — POTASSIUM CHLORIDE CRYS ER 20 MEQ PO TBCR
40.0000 meq | EXTENDED_RELEASE_TABLET | Freq: Two times a day (BID) | ORAL | Status: DC
  Administered 2017-03-23 – 2017-03-24 (×2): 40 meq via ORAL
  Filled 2017-03-23 (×3): qty 2

## 2017-03-23 NOTE — Care Management Obs Status (Signed)
MEDICARE OBSERVATION STATUS NOTIFICATION   Patient Details  Name: Kimberly [name] MRN: [id number] Date of Birth: 02-Jan-1927   Medicare Observation Status Notification Given:  Yes    Sharin Mons, RN 03/23/2017, 2:52 PM

## 2017-03-23 NOTE — Telephone Encounter (Signed)
LMOVM reminding pt to send remote transmission.   

## 2017-03-23 NOTE — Progress Notes (Signed)
Progress Note    Kimberly [name]  EVO:[id number] DOB: 1926-08-13  DOA: 03/21/2017 PCP: Dettinger, Fransisca Kaufmann, MD    Brief Narrative:   Chief complaint: Follow-up abdominal pain  Medical records reviewed and are as summarized below:  Kimberly [name] is an 83 y.o. female with a PMH of fibromyalgia, PUD, stage III CKD, permanent atrial fibrillation s/p AV nodal ablation secondary to complete heart block s/p biventricular pacemaker, nonischemic cardiomyopathy/chronic diastolic CHF with last echo showing an EF of 60-65 percent 11/2014 was admitted 03/21/17 for evaluation of lower abdominal pain associated with dark stool and a drop in her hemoglobin.  Assessment/Plan:   Principal Problems:   Lower abdominal pain associated with possible lower GI bleeding, drop in hemoglobin, in the setting of chronic anticoagulation/anemia/entero-toxigenic Escherichia coli in stool Blood thinners held. Fecal occult blood negative 2. Hemoglobin stable after initial 2 g drop. Patient has been extensively evaluated by GI in the past regarding her abdominal pain. Stool cultures positive for enterotoxigenic Escherichia coli which we will treat with 3 days of azithromycin. Continue Amitiza. Okay to resume anticoagulation.      Acute on Chronic diastolic heart failure ([place]) Reports that she has worsening lower extremity edema and is unable to ambulate because of this (although was able to ambulate 100' w/ RW with PT). Will diurese with IV Lasix.    H/O bipolar / Possible paranoia with olfactory hallucinations Reporting the noxious smell in her room that no one else can smell and feels it is coming from the shower head. Upset with the nursing staff and feel they owe her an apology for not taking her concerns seriously. Spoke with daughter who says her mother is "bipolar" and has had manic episodes in the past. Says she has not been on psychotropics recently, and that she "needs to be". Daughter says she is also an  "alcoholic". Monitor for DTs and add folic acid/thiamine.  Active Problems:   Permanent atrial fibrillation [facility]) s/p Biventricular cardiac pacemaker - [employer] Consult Status post biventricular pacemaker. Supposed to have pacemaker checked today. Have requested cardiologist do this in house.    Fibromyalgia Appears to have multiple somatic complaints including recurrent problems with GI discomfort, headache, etc. Recommend close outpatient follow-up with PCP.    Essential hypertension Continue Lasix.    CKD (chronic kidney disease) stage 3, GFR 30-59 ml/min (HCC) Baseline creatinine 1.27-1.41. Current creatinine 1.58.    Chronic anticoagulation Placed on hold while awaiting fecal occult blood testing.    History of bipolar disorder    PUD (peptic ulcer disease) Continue PPI.    Hypokalemia/hypomagnesemia Replace potassium and magnesium.  Body mass index is 23.01 kg/m.   Family Communication/Anticipated D/C date and plan/Code Status   DVT prophylaxis: SCDs ordered. Code Status: DO NOT RESUSCITATE.  Family Communication: Daughter updated by telephone. Disposition Plan: Needs psych evaluation.   Medical Consultants:    Psychiatry   Anti-Infectives:    Azithromycin 03/22/17--->03/25/17  Subjective:   Continues to report loose stools, had 5 stools today. Some nausea but no vomiting. Reports having a smell in her bathroom coming from shower head. Seems paranoid that the nurses are taking her concerns seriously. She tells me she is unable to ambulate due to worsening swelling in her feet and ankles.  Objective:    Vitals:   03/22/17 0658 03/22/17 1626 03/22/17 2033 03/23/17 0636  BP: 121/60 (!) 115/53 (!) 97/43 (!) 117/52  Pulse: 75 71 72 73  Resp: 20 19 16  18  Temp: 97.9 F (36.6 C) 98.7 F (37.1 C) 97.9 F (36.6 C) (!) 97.5 F (36.4 C)  TempSrc: Oral Oral Oral Oral  SpO2: 98% 100% 97% 98%  Weight:      Height:        Intake/Output Summary (Last 24  hours) at 03/23/2017 0813 Last data filed at 03/22/2017 1838 Gross per 24 hour  Intake 570 ml  Output -  Net 570 ml   Filed Weights   03/21/17 1325 03/21/17 2159  Weight: 65.8 kg (145 lb) 68.6 kg (151 lb 5 oz)    Exam: General: No acute distress. Cardiovascular: Heart sounds show a regular rate, and rhythm. No gallops or rubs. No murmurs. No JVD. Lungs: Clear to auscultation bilaterally with good air movement. No rales, rhonchi or wheezes. Abdomen: Softly distended/diffusely tender with normal active bowel sounds. No masses. No hepatosplenomegaly. Skin: Warm and dry. No rashes or lesions. Extremities: No clubbing or cyanosis. 3+ edema. Pedal pulses 2+. Psychiatric: Mood and affect are anxious with possible paranoia. Insight and judgment are impaired.  Data Reviewed:   I have personally reviewed following labs and imaging studies:  Labs: Labs show the following:   Basic Metabolic Panel: Recent Labs  Lab 03/20/17 1158 03/21/17 1817 03/22/17 0232 03/22/17 1607 03/23/17 0247  NA 137 136 136  --  133*  K 3.3* 4.0 3.1*  --  4.4  CL 104 108 105  --  104  CO2 23 20* 21*  --  21*  GLUCOSE 109* 109* 113*  --  94  BUN 16 17 14   --  14  CREATININE 1.37* 1.76* 1.58*  --  1.41*  CALCIUM 9.1 8.2* 8.4*  --  8.7*  MG  --   --   --  1.5*  --    GFR Estimated Creatinine Clearance: 27.8 mL/min (A) (by C-G formula based on SCr of 1.41 mg/dL (H)). Liver Function Tests: Recent Labs  Lab 03/20/17 1158 03/21/17 1817  AST 27 30  ALT 13* 9*  ALKPHOS 48 41  BILITOT 0.7 0.9  PROT 6.8 6.0*  ALBUMIN 4.0 3.1*   Recent Labs  Lab 03/20/17 1158  LIPASE 34   CBC: Recent Labs  Lab 03/20/17 1158 03/21/17 1817 03/22/17 0232 03/23/17 0247  WBC 5.7 5.8 5.5 6.1  NEUTROABS  --  3.6  --   --   HGB 12.2 10.3* 10.2* 10.6*  HCT 35.5* 30.9* 30.6* 31.8*  MCV 101.1* 100.3* 99.7 100.3*  PLT 196 187 177 204   Anemia work up: Recent Labs    03/21/17 2128  VITAMINB12 293  FOLATE 9.0    FERRITIN 162  TIBC 308  IRON 45  RETICCTPCT 1.8   Sepsis Labs: Recent Labs  Lab 03/20/17 1158 03/21/17 1817 03/21/17 2128 03/22/17 0026 03/22/17 0232 03/23/17 0247  WBC 5.7 5.8  --   --  5.5 6.1  LATICACIDVEN  --   --  1.9 1.9  --   --     Microbiology Recent Results (from the past 240 hour(s))  Gastrointestinal Panel by PCR , Stool     Status: Abnormal   Collection Time: 03/21/17  6:44 PM  Result Value Ref Range Status   Campylobacter species NOT DETECTED NOT DETECTED Final   Plesimonas shigelloides NOT DETECTED NOT DETECTED Final   Salmonella species NOT DETECTED NOT DETECTED Final   Yersinia enterocolitica NOT DETECTED NOT DETECTED Final   Vibrio species NOT DETECTED NOT DETECTED Final   Vibrio cholerae NOT DETECTED NOT DETECTED  Final   Enteroaggregative E coli (EAEC) NOT DETECTED NOT DETECTED Final   Enteropathogenic E coli (EPEC) DETECTED (A) NOT DETECTED Final    Comment: RESULT CALLED TO, READ BACK BY AND VERIFIED WITH: GRETA SCALCO 03/22/17 AT 1712 BY HS    Enterotoxigenic E coli (ETEC) NOT DETECTED NOT DETECTED Final   Shiga like toxin producing E coli (STEC) NOT DETECTED NOT DETECTED Final   Shigella/Enteroinvasive E coli (EIEC) NOT DETECTED NOT DETECTED Final   Cryptosporidium NOT DETECTED NOT DETECTED Final   Cyclospora cayetanensis NOT DETECTED NOT DETECTED Final   Entamoeba histolytica NOT DETECTED NOT DETECTED Final   Giardia lamblia NOT DETECTED NOT DETECTED Final   Adenovirus F40/41 NOT DETECTED NOT DETECTED Final   Astrovirus NOT DETECTED NOT DETECTED Final   Norovirus GI/GII NOT DETECTED NOT DETECTED Final   Rotavirus A NOT DETECTED NOT DETECTED Final   Sapovirus (I, II, IV, and V) NOT DETECTED NOT DETECTED Final  C difficile quick scan w PCR reflex     Status: None   Collection Time: 03/21/17  6:44 PM  Result Value Ref Range Status   C Diff antigen NEGATIVE NEGATIVE Final   C Diff toxin NEGATIVE NEGATIVE Final   C Diff interpretation No C.  difficile detected.  Final    Procedures and diagnostic studies:  No results found.  Medications:   . acidophilus  1 capsule Oral BID WC  . azithromycin  500 mg Oral Daily  . furosemide  20 mg Oral Daily  . lubiprostone  24 mcg Oral BID WC  . multivitamin  1 tablet Oral BID  . pantoprazole  40 mg Oral Daily  . potassium chloride  20 mEq Oral BID  . sodium chloride flush  3 mL Intravenous Q12H   Continuous Infusions: . sodium chloride       LOS: 0 days   Kimberly [name]  [facility] Pager [phone]. If unable to reach me by pager, please call my cell phone at [phone].  *Please refer to [email or web address], password [email or web address] to get updated schedule on who will round on this patient, as hospitalists switch teams weekly. If 7PM-7AM, please contact night-coverage at [email or web address], password [email or web address] for any overnight needs.  03/23/2017, 8:13 AM

## 2017-03-23 NOTE — Plan of Care (Signed)
Patient is progressing, C. Diff negative.  RN will continue to monitor patient and report issues to MD as needed. P.J. Linus Mako, RN

## 2017-03-23 NOTE — Evaluation (Signed)
Physical Therapy Evaluation Patient Details Name: Kimberly [name] MRN: [id number] DOB: 1927/04/29 Today's Date: 03/23/2017   History of Present Illness  Kimberly [name] is an 83 y.o. female with a PMH of fibromyalgia, PUD, stage III CKD, permanent atrial fibrillation s/p AV nodal ablation secondary to complete heart block s/p biventricular pacemaker, nonischemic cardiomyopathy/chronic diastolic CHF with last echo showing an EF of 60-65 percent 11/2014 was admitted 03/21/17 for evaluation of lower abdominal pain associated with dark stool and a drop in her hemoglobin.  Clinical Impression  Pt admitted with above diagnosis. Pt currently with functional limitations due to the deficits listed below (see PT Problem List). Pt cognitively appropriate on eval but quite paranoid about a smell in her room that is quite noxious yet no one else can smell it. Has been addressed in numerous ways but not to her satisfaction and she is now also upset because she feels that everyone thinks she is crazy. Reassurance given that we are doing everything we can to help her. She was appreciative of getting out of the room to walk a bit. Ambulated 100' with RW and min-guard A. No LOB though she does sometimes have difficulty navigating around obstacles with RW. Referring her back to HHPT that had just begun before returning to hospital.  Pt will benefit from skilled PT to increase their independence and safety with mobility to allow discharge to the venue listed below.       Follow Up Recommendations Home health PT    Equipment Recommendations  None recommended by PT    Recommendations for Other Services       Precautions / Restrictions Precautions Precautions: Fall Restrictions Weight Bearing Restrictions: No      Mobility  Bed Mobility Overal bed mobility: Modified Independent             General bed mobility comments: extra time and use of rail  Transfers Overall transfer level: Needs  assistance Equipment used: Rolling walker (2 wheeled) Transfers: Sit to/from Stand Sit to Stand: Supervision         General transfer comment: safe when standing to RW  Ambulation/Gait Ambulation/Gait assistance: Min guard Ambulation Distance (Feet): 100 Feet Assistive device: Rolling walker (2 wheeled) Gait Pattern/deviations: Decreased stride length;Trunk flexed Gait velocity: decreased Gait velocity interpretation: <1.8 ft/sec, indicative of risk for recurrent falls General Gait Details: no LOB, had some difficulty navigating RW around obstacles, got stuck on door jamb with ~10 secs needed to correct and get through. O2 sats remained 97% on RA  Stairs            Wheelchair Mobility    Modified Rankin (Stroke Patients Only)       Balance Overall balance assessment: Needs assistance Sitting-balance support: Feet supported;No upper extremity supported Sitting balance-Leahy Scale: Good     Standing balance support: Bilateral upper extremity supported;During functional activity Standing balance-Leahy Scale: Fair Standing balance comment: needs UE support for dynamic activity, good self awareness of this                             Pertinent Vitals/Pain Pain Assessment: Faces Faces Pain Scale: Hurts a little bit Pain Location: right abdomen Pain Descriptors / Indicators: Constant Pain Intervention(s): Limited activity within patient's tolerance;Monitored during session;Premedicated before session    Home Living Family/patient expects to be discharged to:: Private residence Living Arrangements: Alone Available Help at Discharge: Friend(s);Available PRN/intermittently Type of Home: House Home Access: Stairs to  enter Entrance Stairs-Rails: None Entrance Stairs-Number of Steps: 1 Home Layout: One level Home Equipment: [place] - 2 wheels;Bedside commode Additional Comments: pt reports that she has a neighbor who is very attentive and takes her to the  store when she needs to go. She was talking to her on the phone upon my arrival    Prior Function Level of Independence: Independent with assistive device(s)         Comments: does not drive. Uses RW all the time. Does some shopping with a neighbor but also has [place] delivered to her home     Hand Dominance   Dominant Hand: Right    Extremity/Trunk Assessment   Upper Extremity Assessment Upper Extremity Assessment: Overall WFL for tasks assessed    Lower Extremity Assessment Lower Extremity Assessment: Overall WFL for tasks assessed    Cervical / Trunk Assessment Cervical / Trunk Assessment: Kyphotic  Communication   Communication: No difficulties  Cognition Arousal/Alertness: Awake/alert Behavior During Therapy: Agitated Overall Cognitive Status: Within Functional Limits for tasks assessed                                 General Comments: pt upset because she smells a terrible smell in her room that makes it hard for her to breathe and makes her unable to eat and drink. No one else can smell it but RN, Ginger has had facilities come check the room and addressed in numerous ways. Pt upset because of smell but also upset because she says everyone is treating her like she's crazy.       General Comments General comments (skin integrity, edema, etc.): pt had just started HHPT and wants to go back to that. She was able to show me her exercises and perform a few reps of each    Exercises     Assessment/Plan    PT Assessment Patient needs continued PT services  PT Problem List Decreased balance;Decreased mobility;Decreased activity tolerance;Decreased knowledge of precautions;Decreased knowledge of use of DME;Pain       PT Treatment Interventions Gait training;DME instruction;Stair training;Functional mobility training;Therapeutic activities;Therapeutic exercise;Balance training;Patient/family education    PT Goals (Current goals can be found in the  Care Plan section)  Acute Rehab PT Goals Patient Stated Goal: get stronger and go back home PT Goal Formulation: With patient Time For Goal Achievement: 04/06/17 Potential to Achieve Goals: Good    Frequency Min 3X/week   Barriers to discharge Decreased caregiver support lives alone    Co-evaluation               AM-PAC PT "6 Clicks" Daily Activity  Outcome Measure Difficulty turning over in bed (including adjusting bedclothes, sheets and blankets)?: None Difficulty moving from lying on back to sitting on the side of the bed? : None Difficulty sitting down on and standing up from a chair with arms (e.g., wheelchair, bedside commode, etc,.)?: A Little Help needed moving to and from a bed to chair (including a wheelchair)?: A Little Help needed walking in hospital room?: A Little Help needed climbing 3-5 steps with a railing? : A Little 6 Click Score: 20    End of Session Equipment Utilized During Treatment: Gait belt Activity Tolerance: Patient tolerated treatment well Patient left: in chair;with call bell/phone within reach Nurse Communication: Mobility status PT Visit Diagnosis: Unsteadiness on feet (R26.81);Other abnormalities of gait and mobility (R26.89);Muscle weakness (generalized) (M62.81) Pain - Right/Left: Right Pain -  part of body: (abdomen)    Time: 1561-5379 PT Time Calculation (min) (ACUTE ONLY): 39 min   Charges:   PT Evaluation $PT Eval Moderate Complexity: 1 Mod PT Treatments $Gait Training: 8-22 mins $Therapeutic Activity: 8-22 mins   PT G Codes:   PT G-Codes **NOT FOR INPATIENT CLASS** Functional Assessment Tool Used: AM-PAC 6 Clicks Basic Mobility Functional Limitation: Mobility: Walking and moving around Mobility: Walking and Moving Around Current Status (K3276): At least 20 percent but less than 40 percent impaired, limited or restricted Mobility: Walking and Moving Around Goal Status [phone]): At least 1 percent but less than 20 percent  impaired, limited or restricted    [place], [place]  [place] 03/23/2017, 1:41 PM

## 2017-03-24 DIAGNOSIS — Z87891 Personal history of nicotine dependence: Secondary | ICD-10-CM | POA: Diagnosis not present

## 2017-03-24 DIAGNOSIS — A041 Enterotoxigenic Escherichia coli infection: Secondary | ICD-10-CM | POA: Diagnosis present

## 2017-03-24 DIAGNOSIS — G4700 Insomnia, unspecified: Secondary | ICD-10-CM | POA: Diagnosis not present

## 2017-03-24 DIAGNOSIS — F3170 Bipolar disorder, currently in remission, most recent episode unspecified: Secondary | ICD-10-CM | POA: Diagnosis not present

## 2017-03-24 DIAGNOSIS — N183 Chronic kidney disease, stage 3 (moderate): Secondary | ICD-10-CM | POA: Diagnosis not present

## 2017-03-24 DIAGNOSIS — I5032 Chronic diastolic (congestive) heart failure: Secondary | ICD-10-CM | POA: Diagnosis not present

## 2017-03-24 DIAGNOSIS — D649 Anemia, unspecified: Secondary | ICD-10-CM | POA: Diagnosis not present

## 2017-03-24 DIAGNOSIS — M797 Fibromyalgia: Secondary | ICD-10-CM | POA: Diagnosis not present

## 2017-03-24 DIAGNOSIS — F3164 Bipolar disorder, current episode mixed, severe, with psychotic features: Secondary | ICD-10-CM | POA: Diagnosis not present

## 2017-03-24 DIAGNOSIS — Z7901 Long term (current) use of anticoagulants: Secondary | ICD-10-CM | POA: Diagnosis not present

## 2017-03-24 DIAGNOSIS — R109 Unspecified abdominal pain: Secondary | ICD-10-CM

## 2017-03-24 DIAGNOSIS — Z950 Presence of cardiac pacemaker: Secondary | ICD-10-CM | POA: Diagnosis not present

## 2017-03-24 DIAGNOSIS — I10 Essential (primary) hypertension: Secondary | ICD-10-CM | POA: Diagnosis not present

## 2017-03-24 MED ORDER — POTASSIUM CHLORIDE CRYS ER 20 MEQ PO TBCR: 20.0000 meq | EXTENDED_RELEASE_TABLET | Freq: Two times a day (BID) | ORAL | 0 refills | Status: DC

## 2017-03-24 NOTE — Progress Notes (Signed)
RN tried to discharge patient but she started to complain of pain on her abdominal hernia. She said this pain is new and she would like to be saw by a doctor. MD made aware. Will continue to monitor.

## 2017-03-24 NOTE — Progress Notes (Signed)
Patient was discharged home by MD order; discharged instructions  review and give to patient with care notes; IV DIC; skin intact; patient will be escorted to the car by nurse tech via wheelchair.  

## 2017-03-24 NOTE — Discharge Instructions (Signed)
Abdominal Bloating When you have abdominal bloating, your abdomen may feel full, tight, or painful. It may also look bigger than normal or swollen (distended). Common causes of abdominal bloating include:  Swallowing air.  Constipation.  Problems digesting food.  Eating too much.  Irritable bowel syndrome. This is a condition that affects the large intestine.  Lactose intolerance. This is an inability to digest lactose, a natural sugar in dairy products.  Celiac disease. This is a condition that affects the ability to digest gluten, a protein found in some grains.  Gastroparesis. This is a condition that slows down the movement of food in the stomach and small intestine. It is more common in people with diabetes mellitus.  Gastroesophageal reflux disease (GERD). This is a digestive condition that makes stomach acid flow back into the esophagus.  Urinary retention. This means that the body is holding onto urine, and the bladder cannot be emptied all the way.  Follow these instructions at home: Eating and drinking  Avoid eating too much.  Try not to swallow air while talking or eating.  Avoid eating while lying down.  Avoid these foods and drinks: ? Foods that cause gas, such as broccoli, cabbage, cauliflower, and baked beans. ? Carbonated drinks. ? Hard candy. ? Chewing gum. Medicines  Take over-the-counter and prescription medicines only as told by your health care provider.  Take probiotic medicines. These medicines contain live bacteria or yeasts that can help digestion.  Take coated peppermint oil capsules. Activity  Try to exercise regularly. Exercise may help to relieve bloating that is caused by gas and relieve constipation. General instructions  Keep all follow-up visits as told by your health care provider. This is important. Contact a health care provider if:  You have nausea and vomiting.  You have diarrhea.  You have abdominal pain.  You have  unusual weight loss or weight gain.  You have severe pain, and medicines do not help. Get help right away if:  You have severe chest pain.  You have trouble breathing.  You have shortness of breath.  You have trouble urinating.  You have darker urine than normal.  You have blood in your stools or have dark, tarry stools. Summary  Abdominal bloating means that the abdomen is swollen.  Common causes of abdominal bloating are swallowing air, constipation, and problems digesting food.  Avoid eating too much and avoid swallowing air.  Avoid foods that cause gas, carbonated drinks, hard candy, and chewing gum. This information is not intended to replace advice given to you by your health care provider. Make sure you discuss any questions you have with your health care provider. Document Released: 05/23/2016 Document Revised: 05/23/2016 Document Reviewed: 05/23/2016 Elsevier Interactive Patient Education  2018 [place].   Abdominal Pain, Adult Many things can cause belly (abdominal) pain. Most times, belly pain is not dangerous. Many cases of belly pain can be watched and treated at home. Sometimes belly pain is serious, though. Your doctor will try to find the cause of your belly pain. Follow these instructions at home:  Take over-the-counter and prescription medicines only as told by your doctor. Do not take medicines that help you poop (laxatives) unless told to by your doctor.  Drink enough fluid to keep your pee (urine) clear or pale yellow.  Watch your belly pain for any changes.  Keep all follow-up visits as told by your doctor. This is important. Contact a doctor if:  Your belly pain changes or gets worse.  You  are not hungry, or you lose weight without trying.  You are having trouble pooping (constipated) or have watery poop (diarrhea) for more than 2-3 days.  You have pain when you pee or poop.  Your belly pain wakes you up at night.  Your pain gets worse  with meals, after eating, or with certain foods.  You are throwing up and cannot keep anything down.  You have a fever. Get help right away if:  Your pain does not go away as soon as your doctor says it should.  You cannot stop throwing up.  Your pain is only in areas of your belly, such as the right side or the left lower part of the belly.  You have bloody or black poop, or poop that looks like tar.  You have very bad pain, cramping, or bloating in your belly.  You have signs of not having enough fluid or water in your body (dehydration), such as: ? Dark pee, very little pee, or no pee. ? Cracked lips. ? Dry mouth. ? Sunken eyes. ? Sleepiness. ? Weakness. This information is not intended to replace advice given to you by your health care provider. Make sure you discuss any questions you have with your health care provider. Document Released: 10/08/2007 Document Revised: 11/09/2015 Document Reviewed: 10/03/2015 Elsevier Interactive Patient Education  2017 [employer].

## 2017-03-24 NOTE — Progress Notes (Signed)
Physical Therapy Treatment Patient Details Name: Kimberly [name] MRN: [id number] DOB: [birth year] Today's Date: 03/24/2017    History of Present Illness Kimberly [name] is an 83 y.o. female with a PMH of fibromyalgia, PUD, stage III CKD, permanent atrial fibrillation s/p AV nodal ablation secondary to complete heart block s/p biventricular pacemaker, nonischemic cardiomyopathy/chronic diastolic CHF with last echo showing an EF of 60-65 percent 11/2014 was admitted 03/21/17 for evaluation of lower abdominal pain associated with dark stool and a drop in her hemoglobin.    PT Comments    Patient making improvements with mobility and gait.  Agree with need for HHPT at d/c.   Follow Up Recommendations  Home health PT     Equipment Recommendations  None recommended by PT    Recommendations for Other Services       Precautions / Restrictions Precautions Precautions: Fall Restrictions Weight Bearing Restrictions: No    Mobility  Bed Mobility Overal bed mobility: Modified Independent             General bed mobility comments: extra time and use of rail  Transfers Overall transfer level: Needs assistance Equipment used: Rolling walker (2 wheeled) Transfers: Sit to/from [facility] Sit to Stand: Supervision Stand pivot transfers: Supervision       General transfer comment: Supervision for safety.  Patient able to transfer bed <> BSC, and sit <> stand with use of RW.    Ambulation/Gait Ambulation/Gait assistance: Supervision Ambulation Distance (Feet): 160 Feet Assistive device: Rolling walker (2 wheeled) Gait Pattern/deviations: Step-through pattern;Decreased stride length;Trunk flexed Gait velocity: decreased Gait velocity interpretation: Below normal speed for age/gender General Gait Details: Verbal cues to stay close to RW.  Arm fatigue quickly with RW too far ahead of her, and relying on UE on RW during gait.  Dyspnea 3/4.  Patient required 4 standing rest  breaks during gait.   Stairs            Wheelchair Mobility    Modified Rankin (Stroke Patients Only)       Balance Overall balance assessment: Needs assistance Sitting-balance support: No upper extremity supported;Feet supported Sitting balance-Leahy Scale: Good     Standing balance support: Single extremity supported;During functional activity Standing balance-Leahy Scale: Poor                              Cognition Arousal/Alertness: Awake/alert Behavior During Therapy: WFL for tasks assessed/performed;Anxious Overall Cognitive Status: Within Functional Limits for tasks assessed                                        Exercises      General Comments        Pertinent Vitals/Pain Pain Assessment: Faces Faces Pain Scale: Hurts a little bit Pain Location: right abdomen Pain Descriptors / Indicators: Constant Pain Intervention(s): Limited activity within patient's tolerance;Monitored during session    Home Living                      Prior Function            PT Goals (current goals can now be found in the care plan section) Acute Rehab PT Goals Patient Stated Goal: get stronger and go back home Progress towards PT goals: Progressing toward goals    Frequency    Min 3X/week  PT Plan Current plan remains appropriate    Co-evaluation              AM-PAC PT "6 Clicks" Daily Activity  Outcome Measure  Difficulty turning over in bed (including adjusting bedclothes, sheets and blankets)?: None Difficulty moving from lying on back to sitting on the side of the bed? : None Difficulty sitting down on and standing up from a chair with arms (e.g., wheelchair, bedside commode, etc,.)?: None Help needed moving to and from a bed to chair (including a wheelchair)?: A Little Help needed walking in hospital room?: A Little Help needed climbing 3-5 steps with a railing? : A Little 6 Click Score: 21    End of  Session Equipment Utilized During Treatment: Gait belt Activity Tolerance: Patient tolerated treatment well;Patient limited by fatigue Patient left: in bed;with call bell/phone within reach(sitting EOB) Nurse Communication: Other (comment)(Let NT know patient EOB and wants help gathering belongings) PT Visit Diagnosis: Unsteadiness on feet (R26.81);Other abnormalities of gait and mobility (R26.89);Muscle weakness (generalized) (M62.81) Pain - Right/Left: Right Pain - part of body: (Abdomen)     Time: 5809-9833 PT Time Calculation (min) (ACUTE ONLY): 39 min  Charges:  $Gait Training: 23-37 mins $Therapeutic Activity: 8-22 mins                    G Codes:  Functional Assessment Tool Used: AM-PAC 6 Clicks Basic Mobility Functional Limitation: Mobility: Walking and moving around Mobility: Walking and Moving Around Goal Status [phone]): At least 1 percent but less than 20 percent impaired, limited or restricted Mobility: Walking and Moving Around Discharge Status [phone]): At least 1 percent but less than 20 percent impaired, limited or restricted    Carita Pian. Sanjuana Kava, [facility] Acute Rehab Services Pager [place] 03/24/2017, 6:58 PM

## 2017-03-24 NOTE — Consult Note (Addendum)
[place] Psychiatry Consult   Reason for Consult:  Concern for perceptual disturbance Referring Physician:  Dr. Rockne Menghini Patient Identification: Kimberly [name] MRN:  [id number] Principal Diagnosis: Bipolar affective disorder [facility]) Diagnosis:   Patient Active Problem List   Diagnosis Date Noted  . Bipolar affective disorder ([place]) [F31.9] 03/23/2017  . Olfactory hallucinations [R44.2] 03/23/2017  . Lower abdominal pain [R10.30]   . Lower GI bleed [K92.2]   . Hematochezia [K92.1] 03/01/2017  . Glaucoma [H40.9] 02/28/2017  . Pacemaker [Z95.0] 03/21/2015  . Non-ischemic cardiomyopathy ([place]) [I42.8] 11/28/2014  . PUD (peptic ulcer disease) [K27.9] 06/14/2014  . History of bipolar disorder [Z86.59]   . Hypokalemia [E87.6] 05/26/2014  . Fall at home [W19.Merril Abbe, V25.366] 05/26/2014  . Chronic diastolic heart failure ([place]) [I50.32] 11/15/2013  . Complete heart block ([place]) [I44.2] 11/15/2013  . Inability to ambulate due to ankle or foot [R26.2] 09/05/2013  . Unspecified constipation [K59.00] 08/26/2013  . Anemia [D64.9] 08/25/2013  . CKD (chronic kidney disease) stage 3, GFR 30-59 ml/min (HCC) [N18.3] 10/12/2012  . Chronic anticoagulation [Z79.01] 10/12/2012  . Osteoarthritis [M19.90]   . Permanent atrial fibrillation ([place]) [I48.2]   . Biventricular cardiac pacemaker - [employer] Consulta [Z95.0]   . Fibromyalgia [M79.7]   . Essential hypertension [I10]   . Depression [F32.9]     Total Time spent with patient: 1 hour  Subjective:   Kimberly [name] is a 83 y.o. female patient admitted with lower abdominal pain associated with dark stool and drop in her hemoglobin.  HPI:  Per chart review, primary team is concerned for possible paranoia with olfactory hallucinations. Patient endorses a noxious smell in her room that no one else can smell. She reports that it is coming from the shower head. She is upset with nursing for not taking her concerns seriously. Her daughter reports that she has  bipolar disorder and has had manic episodes in the past. She is also an alcoholic.   Of note, she was last admitted to [facility] in January 2016 for concern of hypomanic symptoms. She had not sleep for 4 days and reported being "very productive and cleaning the closet out multiple times." She was wearing bright lipstick and makeup but she reported that she always wears bright colors and this was not unusual for her. She did not appear manic on admission. She was continued on Paxil 40 mg daily and was recommended to be started on a mood stabilizer by her outpatient provider if her renal function and other abnormal lab values indicated a safe baseline for such intervention.   Kimberly [name] spoke in detail about her family as she was sorting through papers prior to the interview. She reports that she has more good days than bad days but she is sad when she thinks about her son. He no longer communicates with her since his father passed away 6 years ago. She also does not have any contact with other family and report multiple reasons why. She denies SI. She enjoys crocheting but arthritis has been a barrier. She also enjoys Radio producer, writing and spending time with her close friend. She reports a good appetite. She reports poor sleep due to multiple nighttime awakenings to use the restroom since she is taking a diuretic. She is no longer taking psychotropic medications. She discontinued Paxil several years ago because it was causing daytime sedation. She admits to smelling an odor earlier in the shower while the construction workers were working outside. She denies noxious smells now and denies  AVH.   Past Psychiatric History: Bipolar disorder   Risk to Self: Is patient at risk for suicide?: No Risk to Others:  None. Denies HI.  Prior Inpatient Therapy:   She was admitted to [facility] in January 2015 for concern for hypomanic symptoms that appeared to have resolved on admission.  Prior Outpatient Therapy:  She was  previously taking Paxil but reports discontinuing this medication several years ago. She is prescribed Trazodone for sleep. Per chart review, she was still taking it in 2016. She receives therapy through [place] home [facility] care.   Past Medical History:  Past Medical History:  Diagnosis Date  . Acute blood loss anemia 10/11/2012  . Acute diverticulitis 08/24/2013  . Acute on chronic combined systolic and diastolic CHF, NYHA class 4 ([place]) 11/15/2013  . Antral ulcer 10/11/2012  . Arrhythmia    atrial fibb  . Atrial fibrillation ([place])   . Cardiomyopathy, nonischemic ([place])   . Chronic anticoagulation 10/12/2012  . CKD (chronic kidney disease) stage 3, GFR 30-59 ml/min (HCC) 10/12/2012  . Depression   . Erosive esophagitis 10/11/2012  . Fibromyalgia   . Glaucoma   . H/O echocardiogram 2007   EF 40-45%,         . Hypertension   . Osteoarthritis   . Pacemaker    Last saw cards 07/2013  . Scoliosis     Past Surgical History:  Procedure Laterality Date  . ABDOMINAL HYSTERECTOMY    . APPENDECTOMY    . BACK SURGERY    . BIOPSY  03/03/2017   Performed by Danie Binder, MD at [place]  . BREAST SURGERY    . CARDIAC CATHETERIZATION  12/08/2005   LAD AND LEFT MAIN WITH NO HIGH-GRADE STENOSIS. MILD DISEASE IN THE CX AND LAD SYSTEM. SEVERE LV DYSFUNCTION WITH DILATION OF THE LV. EF 15-20%. LV END-DIASTOLIC PRESSURE IS 90. +1 MR.  . CHOLECYSTECTOMY    . COLONOSCOPY N/A 03/03/2017   Performed by Danie Binder, MD at [place]  . CYSTOSCOPY WITH URETHRAL DILITATION N/A 02/24/2013   Performed by Beaulah Dinning I, MD at [facility]  . DOPPLER ECHOCARDIOGRAPHY N/A 05/30/2010   LV SIZE IS NORMAL. LV SYSTOLIC FUNCTION IS LOW NORMAL. EF=50-55%. MILD INFERIOR HYPOKINESIS.MILD TO MODERATE POSTERIOR WALL HYPOKINESIS.PACEMAKER LEAD IN THE RV. LA IS MILDLY DILATED. RA IS MODERATE TO SEVERLY DILATED. PACEMAKER LEAD IN THE RA. MILD CALCICICATION OF THE MV APPARATUS. MODERATE MR. MILD TO MODERATE TR. MILD  PHTN.AV MILDLY SCLEROTIC.  [name] ESOPHAGOGASTRODUODENOSCOPY (EGD) N/A 03/03/2017   Performed by Danie Binder, MD at [place]  . ESOPHAGOGASTRODUODENOSCOPY (EGD) N/A 10/13/2012   Performed by Daneil Dolin, MD at [place]  . HERNIA REPAIR     right inguinal hernia and umbilical  . LOWETR EXT VENOUS Bilateral 11-08-10   R & L- NO EVIDENCE OF THROMBUS OR THROMBOPHLEBITIS. THERE IS MILD AMOUNT OF SUBCUTANEOUS EDEMA NOTED WITHIN THE LEFT CALF AND ANKLE. R & L GSV AND SSV- NO VENOUS INSUFF NOTED.  [name] NECK SURGERY    . NUCLEAR STRESS TEST N/A 02/13/2009   NORMAL PATTERN OF PERFUSION IN ALL REGIONS. POST STRESS VENTICULAR SIZE IS NORMAL. POST  STESS EF 85%.  NORMAL MYOCARDIAL PERFUSION STUDY.  [name] PACEMAKER INSERTION    . POLYPECTOMY  03/03/2017   Performed by Danie Binder, MD at [place]  . TONSILLECTOMY    . YAG LASER APPLICATION Bilateral 12/05/2120   Performed by Williams Che, MD  at [facility]   Family History:  Family History  Problem Relation Age of Onset  . Cancer Sister   . Asthma Sister   . Heart failure Brother   . Pulmonary embolism Brother   . Cancer Brother   . Colon cancer Neg Hx    Family Psychiatric  History: Unknown  Social History:  Social History   Substance and Sexual Activity  Alcohol Use Yes  . Alcohol/week: 0.0 oz   Comment: history of drinking a 1/2 glass of wine in the evening     Social History   Substance and Sexual Activity  Drug Use No    Social History   Socioeconomic History  . Marital status: Widowed    Spouse name: None  . Number of children: None  . Years of education: None  . Highest education level: None  Social Needs  . Financial resource strain: None  . Food insecurity - worry: None  . Food insecurity - inability: None  . Transportation needs - medical: None  . Transportation needs - non-medical: None  Occupational History  . Occupation: IT trainer: RETIRED    Comment: retired  Tobacco Use  .  Smoking status: Former Smoker    Packs/day: 0.50    Years: 30.00    Pack years: 15.00    Types: Cigarettes    Last attempt to quit: 12/13/2004    Years since quitting: 12.2  . Smokeless tobacco: Never Used  . Tobacco comment: former smoker  Substance and Sexual Activity  . Alcohol use: Yes    Alcohol/week: 0.0 oz    Comment: history of drinking a 1/2 glass of wine in the evening  . Drug use: No  . Sexual activity: No  Other Topics Concern  . None  Social History Narrative  . None   Additional Social History: She lives at home alone. She was married twice. She was married to her former husband for 60 plus years. He died from heart complications 6 years ago. She used to work as a Primary school teacher.     Allergies:   Allergies  Allergen Reactions  . Ciprofloxacin Other (See Comments)    Possibly caused diarrhea November 2018  . Flagyl [Metronidazole] Other (See Comments)    Possibly caused diarrhea November 2018  . Penicillins Hives    Has patient had a PCN reaction causing immediate rash, facial/tongue/throat swelling, SOB or lightheadedness with hypotension: Yes Has patient had a PCN reaction causing severe rash involving mucus membranes or skin necrosis: No Has patient had a PCN reaction that required hospitalization No Has patient had a PCN reaction occurring within the last 10 years: no If all of the above answers are "NO", then may proceed with Cephalosporin use.   . Sulfa Antibiotics Rash    Labs:  Results for orders placed or performed during the hospital encounter of 03/21/17 (from the past 48 hour(s))  Occult blood card to lab, stool     Status: None   Collection Time: 03/22/17  3:37 PM  Result Value Ref Range   Fecal Occult Bld NEGATIVE NEGATIVE  Occult blood card to lab, stool     Status: None   Collection Time: 03/22/17  3:39 PM  Result Value Ref Range   Fecal Occult Bld NEGATIVE NEGATIVE  Magnesium     Status: Abnormal   Collection Time: 03/22/17  4:07 PM   Result Value Ref Range   Magnesium 1.5 (L) 1.7 - 2.4 mg/dL  Basic metabolic  panel     Status: Abnormal   Collection Time: 03/23/17  2:47 AM  Result Value Ref Range   Sodium 133 (L) 135 - 145 mmol/L   Potassium 4.4 3.5 - 5.1 mmol/L    Comment: DELTA CHECK NOTED   Chloride 104 101 - 111 mmol/L   CO2 21 (L) 22 - 32 mmol/L   Glucose, Bld 94 65 - 99 mg/dL   BUN 14 6 - 20 mg/dL   Creatinine, Ser 1.41 (H) 0.44 - 1.00 mg/dL   Calcium 8.7 (L) 8.9 - 10.3 mg/dL   GFR calc non Af Amer 32 (L) >60 mL/min   GFR calc Af Amer 37 (L) >60 mL/min    Comment: (NOTE) The eGFR has been calculated using the CKD EPI equation. This calculation has not been validated in all clinical situations. eGFR's persistently <60 mL/min signify possible Chronic Kidney Disease.    Anion gap 8 5 - 15  CBC     Status: Abnormal   Collection Time: 03/23/17  2:47 AM  Result Value Ref Range   WBC 6.1 4.0 - 10.5 K/uL   RBC 3.17 (L) 3.87 - 5.11 MIL/uL   Hemoglobin 10.6 (L) 12.0 - 15.0 g/dL   HCT 31.8 (L) 36.0 - 46.0 %   MCV 100.3 (H) 78.0 - 100.0 fL   MCH 33.4 26.0 - 34.0 pg   MCHC 33.3 30.0 - 36.0 g/dL   RDW 14.8 11.5 - 15.5 %   Platelets 204 150 - 400 K/uL    Current Facility-Administered Medications  Medication Dose Route Frequency Provider Last Rate Last Dose  . 0.9 %  sodium chloride infusion  250 mL Intravenous PRN Derrill Kay A, MD      . acetaminophen (TYLENOL) tablet 1,000 mg  1,000 mg Oral Q6H PRN Phillips Grout, MD   1,000 mg at 03/23/17 0853  . acidophilus (RISAQUAD) capsule 1 capsule  1 capsule Oral BID WC Phillips Grout, MD   1 capsule at 03/24/17 0806  . folic acid (FOLVITE) tablet 1 mg  1 mg Oral Daily Rama, Venetia Maxon, MD   1 mg at 03/24/17 1002  . furosemide (LASIX) tablet 20 mg  20 mg Oral Daily Derrill Kay A, MD   20 mg at 03/24/17 1002  . loperamide (IMODIUM) capsule 2 mg  2 mg Oral QID PRN Phillips Grout, MD   2 mg at 03/23/17 0433  . lubiprostone (AMITIZA) capsule 24 mcg  24 mcg Oral  BID WC Rama, Venetia Maxon, MD   24 mcg at 03/24/17 0806  . multivitamin (PROSIGHT) tablet 1 tablet  1 tablet Oral BID Phillips Grout, MD   1 tablet at 03/24/17 1002  . ondansetron (ZOFRAN) tablet 4 mg  4 mg Oral Q6H PRN Phillips Grout, MD   4 mg at 03/23/17 1001   Or  . ondansetron (ZOFRAN) injection 4 mg  4 mg Intravenous Q6H PRN Phillips Grout, MD      . pantoprazole (PROTONIX) EC tablet 40 mg  40 mg Oral Daily Derrill Kay A, MD   40 mg at 03/24/17 1002  . polyvinyl alcohol (LIQUIFILM TEARS) 1.4 % ophthalmic solution 1-2 drop  1-2 drop Both Eyes BID PRN Phillips Grout, MD   2 drop at 03/22/17 0941  . potassium chloride SA (K-DUR,KLOR-CON) CR tablet 40 mEq  40 mEq Oral BID Rama, Venetia Maxon, MD   40 mEq at 03/24/17 1002  . sodium chloride (OCEAN) 0.65 % nasal spray 1  spray  1 spray Each Nare BID PRN Derrill Kay A, MD      . sodium chloride flush (NS) 0.9 % injection 3 mL  3 mL Intravenous Q12H Derrill Kay A, MD   3 mL at 03/24/17 1003  . sodium chloride flush (NS) 0.9 % injection 3 mL  3 mL Intravenous PRN Derrill Kay A, MD      . thiamine (VITAMIN B-1) tablet 100 mg  100 mg Oral Daily Rama, Venetia Maxon, MD   100 mg at 03/24/17 1002  . traMADol (ULTRAM) tablet 50 mg  50 mg Oral Q12H PRN Rama, Venetia Maxon, MD   50 mg at 03/23/17 1001  . traZODone (DESYREL) tablet 100 mg  100 mg Oral QHS PRN Jani Gravel, MD   100 mg at 03/23/17 2304    Musculoskeletal: Strength & Muscle Tone: within normal limits Gait & Station: normal Patient leans: N/A  Psychiatric Specialty Exam: Physical Exam  Nursing note and vitals reviewed. Constitutional: She is oriented to person, place, and time. She appears well-developed and well-nourished.  HENT:  Head: Normocephalic and atraumatic.  Neck: Normal range of motion.  Respiratory: Effort normal.  Musculoskeletal: Normal range of motion.  Neurological: She is alert and oriented to person, place, and time.  Skin: No rash noted.  Bruising on her right  dorsal forearm.   Psychiatric: She has a normal mood and affect. Her speech is normal and behavior is normal. Judgment and thought content normal. Cognition and memory are normal.    Review of Systems  Constitutional: Negative for chills and fever.  Gastrointestinal: Positive for abdominal pain. Negative for constipation, diarrhea, nausea and vomiting.  Psychiatric/Behavioral: Negative for depression, hallucinations, substance abuse and suicidal ideas. The patient has insomnia. The patient is not nervous/anxious.     Blood pressure 106/78, pulse 66, temperature 98.4 F (36.9 C), temperature source Oral, resp. rate 18, height _0  (1.727 m), weight 62.1 kg (137 lb), SpO2 96 %.Body mass index is 20.83 kg/m.  General Appearance: Well Groomed, elderly, Caucasian female with short blonde hair and corrective lenses who is wearing a hospital gown and sitting upright in bed. NAD.   Eye Contact:  Good  Speech:  Clear and Coherent and Normal Rate  Volume:  Normal  Mood:  Euthymic  Affect:  Congruent and Full Range but tearful when discussing family stressors.   Thought Process:  Goal Directed and Linear  Orientation:  Full (Time, Place, and Person)  Thought Content:  Logical  Suicidal Thoughts:  No  Homicidal Thoughts:  No  Memory:  Immediate;   Good Recent;   Good Remote;   Good  Judgement:  Good  Insight:  Good  Psychomotor Activity:  Normal  Concentration:  Concentration: Good and Attention Span: Good  Recall:  Good  Fund of Knowledge:  Good  Language:  Fair  Akathisia:  No  Handed:  Left  AIMS (if indicated):   N/A  Assets:  Communication Skills Desire for Improvement Financial Resources/Insurance Housing Resilience  ADL's:  Intact  Cognition:  WNL  Sleep:   Fair   Assessment:  Kimberly [name] is a 83 y.o. female who was admitted with lower abdominal pain associated with dark stool and drop in her hemoglobin. Psychiatry was consulted due to concern for possible paranoia with  olfactory hallucinations. Patient is organized in thought process and is not actively responding to internal stimuli. She has an appropriate affect and denies SI, AVH or other perceptual disturbances. She does not warrant  inpatient psychiatric hospitalization.   Treatment Plan Summary: -Patient is psychiatrically cleared. Psychiatry will sign off on patient at this time. Please consult psychiatry again as needed.   Disposition: No evidence of imminent risk to self or others at present.   Patient does not meet criteria for psychiatric inpatient admission.  Faythe Dingwall, DO 03/24/2017 10:04 AM

## 2017-03-24 NOTE — Discharge Summary (Signed)
Physician Discharge Summary  LAMIYAH SCHLOTTER YPP:[id number] DOB: 07/10/1926 DOA: 03/21/2017  PCP: Dettinger, Fransisca Kaufmann, MD  Admit date: 03/21/2017 Discharge date: 03/24/2017  Admitted From: Home Discharge disposition: Home   Recommendations for Outpatient Follow-Up:   1. Recommend outpatient follow-up with PCP for referral to general surgery to consider repair of midline abdominal hernia.   Discharge Diagnosis:   Principal Problem:   Enteritis of small intestine due to enterotoxigenic Escherichia coli associated with diarrhea Active Problems:   Permanent atrial fibrillation (HCC)   Biventricular cardiac pacemaker - [employer] Consulta   Fibromyalgia   Essential hypertension   CKD (chronic kidney disease) stage 3, GFR 30-59 ml/min (HCC)   Chronic anticoagulation   Anemia   Chronic diastolic heart failure (HCC)   History of bipolar disorder   PUD (peptic ulcer disease)   Lower abdominal pain   Bipolar affective disorder (HCC)   Olfactory hallucinations  Discharge Condition: Improved.  Diet recommendation: Low sodium, heart healthy.    History of Present Illness:   Kimberly [name] is an 83 y.o. female with a PMH of fibromyalgia, PUD, stage III CKD, permanent atrial fibrillation s/p AV nodal ablation secondary to complete heart block s/p biventricular pacemaker, nonischemic cardiomyopathy/chronic diastolic CHF with last echo showing an EF of 60-65 percent 11/2014 was admitted 03/21/17 for evaluation of lower abdominal pain associated with dark stool and a drop in her hemoglobin.  Hospital Course by Problem:   Principal Problems:   Lower abdominal pain associated with possible lower GI bleeding, drop in hemoglobin, in the setting of chronic anticoagulation/anemia/entero-toxigenic Escherichia coli in stool Blood thinners held on admission due to concerns for GI bleeding given 2 g drop in her hemoglobin. Fecal occult blood negative 2. Hemoglobin stable after initial 2 g drop.  Patient has been extensively evaluated by GI in the past regarding her abdominal pain. Stool cultures positive for enterotoxigenic Escherichia coli which was treated with 3 days of azithromycin. Okay to resume anticoagulation.      Acute on Chronic diastolic heart failure ([place]) Reports that she has worsening lower extremity edema and is unable to ambulate because of this (although was able to ambulate 100' w/ RW with PT). And IV Lasix on 03/23/17 with 670 mL of diuresis and improved appearance of lower extremity swelling.    H/O bipolar / Possible paranoia with olfactory hallucinations Reported a noxious smell in her room that no one else could smell and felt it was coming from the shower head. Upset with the nursing staff and felt they owed her an apology for not taking her concerns seriously. Spoke with daughter who says her mother is "bipolar" and has had manic episodes in the past, and also "narcissistic". Says she has not been on psychotropics recently, and that she "needs to be". Daughter says she is also an "alcoholic". No evidence of alcohol withdrawal while in hospital. Evaluated by psychiatrist prior to discharge. I have encouraged her to follow-up with psychiatry as an outpatient.  Active Problems:   Permanent atrial fibrillation [facility]) s/p Biventricular cardiac pacemaker - [employer] Consult Follow-up with cardiology as an outpatient.    Fibromyalgia Appears to have multiple somatic complaints including recurrent problems with GI discomfort, headache, etc. Recommend close outpatient follow-up with PCP. Suspect somatization.    Essential hypertension Continue Lasix.    CKD (chronic kidney disease) stage 3, GFR 30-59 ml/min (HCC) Baseline creatinine 1.27-1.41. Current creatinine 1.58.    Chronic anticoagulation Initially held, okay to resume as there is no evidence  of GI bleeding.    History of bipolar disorder Seen by psychiatry while in house. Formal consultation note  pending. Recommend close outpatient follow-up. Does not appear to be psychotic nor is she a danger to herself or others.    PUD (peptic ulcer disease) Continue PPI.    Hypokalemia/hypomagnesemia Replaced potassium and magnesium.  Body mass index is 23.01 kg/m.   Medical Consultants:    Psychiatry   Discharge Exam:   Vitals:   03/24/17 0700 03/24/17 1425  BP: 106/78 132/62  Pulse: 66 69  Resp: 18 18  Temp: 98.4 F (36.9 C) 98.3 F (36.8 C)  SpO2: 96% 96%   Vitals:   03/23/17 1633 03/23/17 2111 03/24/17 0700 03/24/17 1425  BP: (!) 126/55 (!) 134/56 106/78 132/62  Pulse:  73 66 69  Resp: 16 20 18 18   Temp: 97.9 F (36.6 C) 97.8 F (36.6 C) 98.4 F (36.9 C) 98.3 F (36.8 C)  TempSrc: Oral Oral Oral Oral  SpO2: 98% 97% 96% 96%  Weight:   62.1 kg (137 lb)   Height:        General exam: Appears calm and comfortable.  Respiratory system: Clear to auscultation. Respiratory effort normal. Cardiovascular system: S1 & S2 heard, RRR. No JVD,  rubs, gallops or clicks. No murmurs. Gastrointestinal system: Abdomen distended with a small midline hernia that is reducible and not particularly tender to manipulation. Central nervous system: Alert and oriented. No focal neurological deficits. Extremities: No clubbing,  or cyanosis. No edema. Skin: No rashes, lesions or ulcers. Psychiatry: Judgement and insight appear mildly impaired. Mood & affect appropriate.    The results of significant diagnostics from this hospitalization (including imaging, microbiology, ancillary and laboratory) are listed below for reference.     Procedures and Diagnostic Studies:   No results found.   Labs:   Basic Metabolic Panel: Recent Labs  Lab 03/20/17 1158 03/21/17 1817 03/22/17 0232 03/22/17 1607 03/23/17 0247  NA 137 136 136  --  133*  K 3.3* 4.0 3.1*  --  4.4  CL 104 108 105  --  104  CO2 23 20* 21*  --  21*  GLUCOSE 109* 109* 113*  --  94  BUN 16 17 14   --  14    CREATININE 1.37* 1.76* 1.58*  --  1.41*  CALCIUM 9.1 8.2* 8.4*  --  8.7*  MG  --   --   --  1.5*  --    GFR Estimated Creatinine Clearance: 27 mL/min (A) (by C-G formula based on SCr of 1.41 mg/dL (H)). Liver Function Tests: Recent Labs  Lab 03/20/17 1158 03/21/17 1817  AST 27 30  ALT 13* 9*  ALKPHOS 48 41  BILITOT 0.7 0.9  PROT 6.8 6.0*  ALBUMIN 4.0 3.1*   Recent Labs  Lab 03/20/17 1158  LIPASE 34   CBC: Recent Labs  Lab 03/20/17 1158 03/21/17 1817 03/22/17 0232 03/23/17 0247  WBC 5.7 5.8 5.5 6.1  NEUTROABS  --  3.6  --   --   HGB 12.2 10.3* 10.2* 10.6*  HCT 35.5* 30.9* 30.6* 31.8*  MCV 101.1* 100.3* 99.7 100.3*  PLT 196 187 177 204   Anemia work up Recent Labs    03/21/17 2128  VITAMINB12 293  FOLATE 9.0  FERRITIN 162  TIBC 308  IRON 45  RETICCTPCT 1.8   Microbiology Recent Results (from the past 240 hour(s))  Gastrointestinal Panel by PCR , Stool     Status: Abnormal   Collection  Time: 03/21/17  6:44 PM  Result Value Ref Range Status   Campylobacter species NOT DETECTED NOT DETECTED Final   Plesimonas shigelloides NOT DETECTED NOT DETECTED Final   Salmonella species NOT DETECTED NOT DETECTED Final   Yersinia enterocolitica NOT DETECTED NOT DETECTED Final   Vibrio species NOT DETECTED NOT DETECTED Final   Vibrio cholerae NOT DETECTED NOT DETECTED Final   Enteroaggregative E coli (EAEC) NOT DETECTED NOT DETECTED Final   Enteropathogenic E coli (EPEC) DETECTED (A) NOT DETECTED Final    Comment: RESULT CALLED TO, READ BACK BY AND VERIFIED WITH: GRETA SCALCO 03/22/17 AT 1712 BY HS    Enterotoxigenic E coli (ETEC) NOT DETECTED NOT DETECTED Final   Shiga like toxin producing E coli (STEC) NOT DETECTED NOT DETECTED Final   Shigella/Enteroinvasive E coli (EIEC) NOT DETECTED NOT DETECTED Final   Cryptosporidium NOT DETECTED NOT DETECTED Final   Cyclospora cayetanensis NOT DETECTED NOT DETECTED Final   Entamoeba histolytica NOT DETECTED NOT DETECTED  Final   Giardia lamblia NOT DETECTED NOT DETECTED Final   Adenovirus F40/41 NOT DETECTED NOT DETECTED Final   Astrovirus NOT DETECTED NOT DETECTED Final   Norovirus GI/GII NOT DETECTED NOT DETECTED Final   Rotavirus A NOT DETECTED NOT DETECTED Final   Sapovirus (I, II, IV, and V) NOT DETECTED NOT DETECTED Final  C difficile quick scan w PCR reflex     Status: None   Collection Time: 03/21/17  6:44 PM  Result Value Ref Range Status   C Diff antigen NEGATIVE NEGATIVE Final   C Diff toxin NEGATIVE NEGATIVE Final   C Diff interpretation No C. difficile detected.  Final     Discharge Instructions:   Discharge Instructions    Call MD for:  extreme fatigue   Complete by:  As directed    Call MD for:  persistant dizziness or light-headedness   Complete by:  As directed    Call MD for:  persistant nausea and vomiting   Complete by:  As directed    Call MD for:  severe uncontrolled pain   Complete by:  As directed    Diet - low sodium heart healthy   Complete by:  As directed    Discharge instructions   Complete by:  As directed    We recommend you follow up with your PCP for treatment of your depression.   Increase activity slowly   Complete by:  As directed      Allergies as of 03/24/2017      Reactions   Ciprofloxacin Other (See Comments)   Possibly caused diarrhea November 2018   Flagyl [metronidazole] Other (See Comments)   Possibly caused diarrhea November 2018   Penicillins Hives   Has patient had a PCN reaction causing immediate rash, facial/tongue/throat swelling, SOB or lightheadedness with hypotension: Yes Has patient had a PCN reaction causing severe rash involving mucus membranes or skin necrosis: No Has patient had a PCN reaction that required hospitalization No Has patient had a PCN reaction occurring within the last 10 years: no If all of the above answers are "NO", then may proceed with Cephalosporin use.   Sulfa Antibiotics Rash      Medication List     STOP taking these medications   ciprofloxacin 250 MG tablet Commonly known as:  CIPRO   metroNIDAZOLE 500 MG tablet Commonly known as:  FLAGYL   omeprazole 20 MG capsule Commonly known as:  PRILOSEC     TAKE these medications   acetaminophen 500  MG tablet Commonly known as:  TYLENOL Take 1,000 mg every 6 (six) hours as needed by mouth for headache (pain).   carvedilol 6.25 MG tablet Commonly known as:  COREG TAKE 1 TABLET BY MOUTH TWO  TIMES DAILY WITH A MEAL What changed:    how much to take  how to take this  when to take this  additional instructions   ELIQUIS 2.5 MG Tabs tablet Generic drug:  apixaban TAKE 1 TABLET BY MOUTH  TWICE A DAY What changed:    how much to take  how to take this  when to take this   furosemide 20 MG tablet Commonly known as:  LASIX Take 1 tablet (20 mg total) by mouth daily.   loperamide 2 MG tablet Commonly known as:  IMODIUM A-D Take 2 mg 4 (four) times daily as needed by mouth for diarrhea or loose stools.   pantoprazole 40 MG tablet Commonly known as:  PROTONIX Take 40 mg daily by mouth.   potassium chloride SA 20 MEQ tablet Commonly known as:  K-DUR,KLOR-CON Take 1 tablet (20 mEq total) by mouth 2 (two) times daily.   PRESERVISION AREDS 2 Caps Take 1 capsule daily by mouth.   PROBIOTIC COLON SUPPORT Caps Take 1 capsule 2 (two) times daily with a meal by mouth.   sodium chloride 0.65 % Soln nasal spray Commonly known as:  OCEAN Place 1 spray 2 (two) times daily as needed into both nostrils for congestion.   SYSTANE ULTRA 0.4-0.3 % Soln Generic drug:  Polyethyl Glycol-Propyl Glycol Place 1-2 drops 2 (two) times daily as needed into both eyes (dry eyes).   traZODone 50 MG tablet Commonly known as:  DESYREL Take 0.5-2 tablets (25-100 mg total) at bedtime as needed by mouth for sleep. What changed:    how much to take  when to take this      Follow-up Information    Dettinger, Fransisca Kaufmann, MD. Schedule an  appointment as soon as possible for a visit in 1 week(s).   Specialties:  Family Medicine, Cardiology Why:  Hospital follow up. Needs to be evaluated for mood disorder. Follow up abdominal pain symptoms s/p treatment for E. Coli diarrhea. Contact information: [place] [address] [phone]        [facility]. Schedule an appointment as soon as possible for a visit.   Why:  To follow up on your abdominal hernia. Contact information: [address] [place] [address]           Time coordinating discharge: 35 minutes.  SignedMargreta Journey Aseneth Hack  Pager [phone] [facility] 03/24/2017, 2:28 PM

## 2017-03-25 ENCOUNTER — Ambulatory Visit: Payer: Medicare Other | Admitting: Family Medicine

## 2017-03-25 ENCOUNTER — Ambulatory Visit (INDEPENDENT_AMBULATORY_CARE_PROVIDER_SITE_OTHER): Payer: Medicare Other | Admitting: Family Medicine

## 2017-03-25 ENCOUNTER — Encounter: Payer: Self-pay | Admitting: Cardiology

## 2017-03-25 ENCOUNTER — Encounter: Payer: Self-pay | Admitting: Family Medicine

## 2017-03-25 VITALS — BP 177/83 | HR 77 | Temp 97.2°F | Ht 68.0 in | Wt 152.2 lb

## 2017-03-25 DIAGNOSIS — F331 Major depressive disorder, recurrent, moderate: Secondary | ICD-10-CM | POA: Diagnosis not present

## 2017-03-25 DIAGNOSIS — R600 Localized edema: Secondary | ICD-10-CM | POA: Diagnosis not present

## 2017-03-25 MED ORDER — PAROXETINE HCL 20 MG PO TABS: 20.0000 mg | ORAL_TABLET | Freq: Every day | ORAL | 2 refills | Status: DC

## 2017-03-25 NOTE — Progress Notes (Signed)
BP (!) 177/83   Pulse 77   Temp (!) 97.2 F (36.2 C) (Oral)   Ht 5\' 8"  (1.727 m)   Wt 152 lb 3.2 oz (69 kg)   BMI 23.14 kg/m    Subjective:    Patient ID: Kimberly [name], female    DOB: December 28, 1926, 83 y.o.   MRN: [id number]  HPI: OLIVIANNA [name] is a 83 y.o. female presenting on 03/25/2017 for Hospitalization Follow-up (11/17 [facility]- abd pain. Patient states she would also like to be on something for depressuin)   HPI Depression Patient is coming in today to discuss depression.  She says in the past very distantly she had been treated for depression and was on a medication for it.  She says the medication helps her greatly.  The medication was Paxil as far she can recall.  She says she has been feeling more sad and down lately and would like to go ahead and start an antidepressant.  She denies any suicidal ideations or thoughts of hurting herself.  A lot of her anxiety depression since her the fact that her son has disowned her and she does not have any family around or close friends and has not seen her grandkids in quite some time.  Leg swelling Patient has been fighting issues chronically of bilateral lower leg swelling and has tried different medication for that have not helped significantly.  She would like to try something on her legs that can help keep it up rather than pull it out once its down  She is also coming in for hospital follow-up for abdominal pain.  She still feels somewhat gassy and bloated.  She frequently gets constipation but this time they told her that she had E. coli enterocolitis.  She is feeling a lot better and starting to have normal bowel movements and starting to eat better.  She denies any fevers or chills or blood in her stool.  Relevant past medical, surgical, family and social history reviewed and updated as indicated. Interim medical history since our last visit reviewed. Allergies and medications reviewed and updated.  Review of Systems  Constitutional:  Negative for chills and fever.  Eyes: Negative for redness and visual disturbance.  Respiratory: Negative for chest tightness and shortness of breath.   Cardiovascular: Negative for chest pain and leg swelling.  Gastrointestinal: Positive for abdominal distention and abdominal pain. Negative for anal bleeding, blood in stool, constipation, diarrhea, nausea and vomiting.  Musculoskeletal: Negative for back pain and gait problem.  Skin: Negative for rash.  Neurological: Negative for light-headedness and headaches.  Psychiatric/Behavioral: Positive for decreased concentration and dysphoric mood. Negative for agitation, behavioral problems, self-injury, sleep disturbance and suicidal ideas. The patient is nervous/anxious.   All other systems reviewed and are negative.   Per HPI unless specifically indicated above        Objective:    BP (!) 177/83   Pulse 77   Temp (!) 97.2 F (36.2 C) (Oral)   Ht 5\' 8"  (1.727 m)   Wt 152 lb 3.2 oz (69 kg)   BMI 23.14 kg/m   Wt Readings from Last 3 Encounters:  03/25/17 152 lb 3.2 oz (69 kg)  03/24/17 137 lb (62.1 kg)  03/20/17 149 lb (67.6 kg)    Physical Exam  Constitutional: She is oriented to person, place, and time. She appears well-developed and well-nourished. No distress.  Eyes: Conjunctivae are normal.  Cardiovascular: Normal rate, regular rhythm, normal heart sounds and intact distal pulses.  No murmur heard. Pulmonary/Chest: Effort normal and breath sounds normal. No respiratory distress. She has no wheezes. She has no rales.  Abdominal: Soft. Bowel sounds are normal. She exhibits no distension. There is tenderness (Mild epigastric and periumbilical tenderness). There is no rebound and no guarding.  Musculoskeletal: Normal range of motion. She exhibits edema.  Neurological: She is alert and oriented to person, place, and time. Coordination normal.  Skin: Skin is warm and dry. No rash noted. She is not diaphoretic.  Psychiatric: Her  behavior is normal. Judgment normal. Her mood appears anxious. She exhibits a depressed mood. She expresses no suicidal ideation. She expresses no suicidal plans.  Nursing note and vitals reviewed.       Assessment & Plan:   Problem List Items Addressed This Visit      Other   Depression - Primary   Relevant Medications   PARoxetine (PAXIL) 20 MG tablet    Other Visit Diagnoses    Bilateral leg edema       Relevant Orders   DME Other see comment       Follow up plan: Return in about 2 months (around 05/25/2017), or if symptoms worsen or fail to improve, for Depression follow-up.  Counseling provided for all of the vaccine components Orders Placed This Encounter  Procedures  . DME Other see comment    Caryl Pina, MD [facility] 03/25/2017, 2:18 PM

## 2017-03-26 DIAGNOSIS — Z91041 Radiographic dye allergy status: Secondary | ICD-10-CM | POA: Diagnosis not present

## 2017-03-26 DIAGNOSIS — R531 Weakness: Secondary | ICD-10-CM | POA: Diagnosis not present

## 2017-03-26 DIAGNOSIS — R1031 Right lower quadrant pain: Secondary | ICD-10-CM | POA: Diagnosis not present

## 2017-03-26 DIAGNOSIS — R404 Transient alteration of awareness: Secondary | ICD-10-CM | POA: Diagnosis not present

## 2017-03-26 DIAGNOSIS — Z881 Allergy status to other antibiotic agents status: Secondary | ICD-10-CM | POA: Diagnosis not present

## 2017-03-26 DIAGNOSIS — N183 Chronic kidney disease, stage 3 (moderate): Secondary | ICD-10-CM | POA: Diagnosis not present

## 2017-03-26 DIAGNOSIS — E876 Hypokalemia: Secondary | ICD-10-CM | POA: Diagnosis not present

## 2017-03-26 DIAGNOSIS — R197 Diarrhea, unspecified: Secondary | ICD-10-CM | POA: Diagnosis not present

## 2017-03-26 DIAGNOSIS — Z880 Allergy status to penicillin: Secondary | ICD-10-CM | POA: Diagnosis not present

## 2017-03-26 DIAGNOSIS — Z79899 Other long term (current) drug therapy: Secondary | ICD-10-CM | POA: Diagnosis not present

## 2017-03-26 DIAGNOSIS — R42 Dizziness and giddiness: Secondary | ICD-10-CM | POA: Diagnosis not present

## 2017-03-26 DIAGNOSIS — R5381 Other malaise: Secondary | ICD-10-CM | POA: Diagnosis not present

## 2017-03-26 DIAGNOSIS — R51 Headache: Secondary | ICD-10-CM | POA: Diagnosis not present

## 2017-03-26 DIAGNOSIS — Z79891 Long term (current) use of opiate analgesic: Secondary | ICD-10-CM | POA: Diagnosis not present

## 2017-03-26 DIAGNOSIS — R109 Unspecified abdominal pain: Secondary | ICD-10-CM | POA: Diagnosis not present

## 2017-03-26 DIAGNOSIS — E871 Hypo-osmolality and hyponatremia: Secondary | ICD-10-CM | POA: Diagnosis not present

## 2017-03-26 DIAGNOSIS — I2510 Atherosclerotic heart disease of native coronary artery without angina pectoris: Secondary | ICD-10-CM | POA: Diagnosis not present

## 2017-03-26 DIAGNOSIS — E860 Dehydration: Secondary | ICD-10-CM | POA: Diagnosis not present

## 2017-03-26 DIAGNOSIS — R55 Syncope and collapse: Secondary | ICD-10-CM | POA: Diagnosis not present

## 2017-03-26 DIAGNOSIS — R1032 Left lower quadrant pain: Secondary | ICD-10-CM | POA: Diagnosis not present

## 2017-03-26 DIAGNOSIS — I482 Chronic atrial fibrillation: Secondary | ICD-10-CM | POA: Diagnosis not present

## 2017-03-26 DIAGNOSIS — Z888 Allergy status to other drugs, medicaments and biological substances status: Secondary | ICD-10-CM | POA: Diagnosis not present

## 2017-03-26 DIAGNOSIS — I129 Hypertensive chronic kidney disease with stage 1 through stage 4 chronic kidney disease, or unspecified chronic kidney disease: Secondary | ICD-10-CM | POA: Diagnosis not present

## 2017-03-26 DIAGNOSIS — Z7902 Long term (current) use of antithrombotics/antiplatelets: Secondary | ICD-10-CM | POA: Diagnosis not present

## 2017-03-26 DIAGNOSIS — Z950 Presence of cardiac pacemaker: Secondary | ICD-10-CM | POA: Diagnosis not present

## 2017-03-26 DIAGNOSIS — K5900 Constipation, unspecified: Secondary | ICD-10-CM | POA: Diagnosis not present

## 2017-03-27 DIAGNOSIS — R42 Dizziness and giddiness: Secondary | ICD-10-CM | POA: Diagnosis not present

## 2017-03-27 DIAGNOSIS — R51 Headache: Secondary | ICD-10-CM | POA: Diagnosis not present

## 2017-03-27 DIAGNOSIS — R55 Syncope and collapse: Secondary | ICD-10-CM | POA: Diagnosis not present

## 2017-03-27 DIAGNOSIS — E860 Dehydration: Secondary | ICD-10-CM | POA: Diagnosis not present

## 2017-03-27 DIAGNOSIS — E871 Hypo-osmolality and hyponatremia: Secondary | ICD-10-CM | POA: Diagnosis not present

## 2017-03-28 DIAGNOSIS — R42 Dizziness and giddiness: Secondary | ICD-10-CM | POA: Diagnosis not present

## 2017-03-28 DIAGNOSIS — Z880 Allergy status to penicillin: Secondary | ICD-10-CM | POA: Diagnosis not present

## 2017-03-28 DIAGNOSIS — N183 Chronic kidney disease, stage 3 (moderate): Secondary | ICD-10-CM | POA: Diagnosis not present

## 2017-03-28 DIAGNOSIS — R5381 Other malaise: Secondary | ICD-10-CM | POA: Diagnosis not present

## 2017-03-28 DIAGNOSIS — Z888 Allergy status to other drugs, medicaments and biological substances status: Secondary | ICD-10-CM | POA: Diagnosis not present

## 2017-03-28 DIAGNOSIS — R531 Weakness: Secondary | ICD-10-CM | POA: Diagnosis not present

## 2017-03-28 DIAGNOSIS — Z882 Allergy status to sulfonamides status: Secondary | ICD-10-CM | POA: Diagnosis not present

## 2017-03-28 DIAGNOSIS — Z881 Allergy status to other antibiotic agents status: Secondary | ICD-10-CM | POA: Diagnosis not present

## 2017-03-28 DIAGNOSIS — D649 Anemia, unspecified: Secondary | ICD-10-CM | POA: Diagnosis not present

## 2017-03-28 DIAGNOSIS — R1084 Generalized abdominal pain: Secondary | ICD-10-CM | POA: Diagnosis not present

## 2017-03-28 DIAGNOSIS — M6281 Muscle weakness (generalized): Secondary | ICD-10-CM | POA: Diagnosis not present

## 2017-03-28 DIAGNOSIS — R51 Headache: Secondary | ICD-10-CM | POA: Diagnosis not present

## 2017-03-28 DIAGNOSIS — Z79891 Long term (current) use of opiate analgesic: Secondary | ICD-10-CM | POA: Diagnosis not present

## 2017-03-28 DIAGNOSIS — R2689 Other abnormalities of gait and mobility: Secondary | ICD-10-CM | POA: Diagnosis not present

## 2017-03-28 DIAGNOSIS — K5900 Constipation, unspecified: Secondary | ICD-10-CM | POA: Diagnosis not present

## 2017-03-28 DIAGNOSIS — E876 Hypokalemia: Secondary | ICD-10-CM | POA: Diagnosis not present

## 2017-03-28 DIAGNOSIS — M489 Spondylopathy, unspecified: Secondary | ICD-10-CM | POA: Diagnosis not present

## 2017-03-28 DIAGNOSIS — I129 Hypertensive chronic kidney disease with stage 1 through stage 4 chronic kidney disease, or unspecified chronic kidney disease: Secondary | ICD-10-CM | POA: Diagnosis not present

## 2017-03-28 DIAGNOSIS — E860 Dehydration: Secondary | ICD-10-CM | POA: Diagnosis not present

## 2017-03-28 DIAGNOSIS — I2510 Atherosclerotic heart disease of native coronary artery without angina pectoris: Secondary | ICD-10-CM | POA: Diagnosis not present

## 2017-03-28 DIAGNOSIS — E871 Hypo-osmolality and hyponatremia: Secondary | ICD-10-CM | POA: Diagnosis not present

## 2017-03-28 DIAGNOSIS — G4700 Insomnia, unspecified: Secondary | ICD-10-CM | POA: Diagnosis not present

## 2017-03-28 DIAGNOSIS — I482 Chronic atrial fibrillation: Secondary | ICD-10-CM | POA: Diagnosis not present

## 2017-03-28 DIAGNOSIS — R1032 Left lower quadrant pain: Secondary | ICD-10-CM | POA: Diagnosis not present

## 2017-03-28 DIAGNOSIS — R1031 Right lower quadrant pain: Secondary | ICD-10-CM | POA: Diagnosis not present

## 2017-03-28 DIAGNOSIS — R109 Unspecified abdominal pain: Secondary | ICD-10-CM | POA: Diagnosis not present

## 2017-03-28 DIAGNOSIS — R197 Diarrhea, unspecified: Secondary | ICD-10-CM | POA: Diagnosis not present

## 2017-03-28 DIAGNOSIS — Z7902 Long term (current) use of antithrombotics/antiplatelets: Secondary | ICD-10-CM | POA: Diagnosis not present

## 2017-03-28 DIAGNOSIS — Z79899 Other long term (current) drug therapy: Secondary | ICD-10-CM | POA: Diagnosis not present

## 2017-03-28 DIAGNOSIS — K219 Gastro-esophageal reflux disease without esophagitis: Secondary | ICD-10-CM | POA: Diagnosis not present

## 2017-03-28 DIAGNOSIS — Z91041 Radiographic dye allergy status: Secondary | ICD-10-CM | POA: Diagnosis not present

## 2017-03-28 DIAGNOSIS — Z950 Presence of cardiac pacemaker: Secondary | ICD-10-CM | POA: Diagnosis not present

## 2017-03-29 DIAGNOSIS — Z91041 Radiographic dye allergy status: Secondary | ICD-10-CM | POA: Diagnosis not present

## 2017-03-29 DIAGNOSIS — R1031 Right lower quadrant pain: Secondary | ICD-10-CM | POA: Diagnosis not present

## 2017-03-29 DIAGNOSIS — R5381 Other malaise: Secondary | ICD-10-CM | POA: Diagnosis not present

## 2017-03-29 DIAGNOSIS — I129 Hypertensive chronic kidney disease with stage 1 through stage 4 chronic kidney disease, or unspecified chronic kidney disease: Secondary | ICD-10-CM | POA: Diagnosis not present

## 2017-03-29 DIAGNOSIS — K5900 Constipation, unspecified: Secondary | ICD-10-CM | POA: Diagnosis not present

## 2017-03-29 DIAGNOSIS — R531 Weakness: Secondary | ICD-10-CM | POA: Diagnosis not present

## 2017-03-29 DIAGNOSIS — E860 Dehydration: Secondary | ICD-10-CM | POA: Diagnosis not present

## 2017-03-29 DIAGNOSIS — I482 Chronic atrial fibrillation: Secondary | ICD-10-CM | POA: Diagnosis not present

## 2017-03-29 DIAGNOSIS — R1032 Left lower quadrant pain: Secondary | ICD-10-CM | POA: Diagnosis not present

## 2017-03-29 DIAGNOSIS — N183 Chronic kidney disease, stage 3 (moderate): Secondary | ICD-10-CM | POA: Diagnosis not present

## 2017-03-29 DIAGNOSIS — E871 Hypo-osmolality and hyponatremia: Secondary | ICD-10-CM | POA: Diagnosis not present

## 2017-03-29 DIAGNOSIS — Z888 Allergy status to other drugs, medicaments and biological substances status: Secondary | ICD-10-CM | POA: Diagnosis not present

## 2017-03-29 DIAGNOSIS — Z881 Allergy status to other antibiotic agents status: Secondary | ICD-10-CM | POA: Diagnosis not present

## 2017-03-29 DIAGNOSIS — E876 Hypokalemia: Secondary | ICD-10-CM | POA: Diagnosis not present

## 2017-03-29 DIAGNOSIS — Z7902 Long term (current) use of antithrombotics/antiplatelets: Secondary | ICD-10-CM | POA: Diagnosis not present

## 2017-03-29 DIAGNOSIS — I2510 Atherosclerotic heart disease of native coronary artery without angina pectoris: Secondary | ICD-10-CM | POA: Diagnosis not present

## 2017-03-29 DIAGNOSIS — Z79891 Long term (current) use of opiate analgesic: Secondary | ICD-10-CM | POA: Diagnosis not present

## 2017-03-29 DIAGNOSIS — Z79899 Other long term (current) drug therapy: Secondary | ICD-10-CM | POA: Diagnosis not present

## 2017-03-29 DIAGNOSIS — R197 Diarrhea, unspecified: Secondary | ICD-10-CM | POA: Diagnosis not present

## 2017-03-29 DIAGNOSIS — R42 Dizziness and giddiness: Secondary | ICD-10-CM | POA: Diagnosis not present

## 2017-03-29 DIAGNOSIS — Z950 Presence of cardiac pacemaker: Secondary | ICD-10-CM | POA: Diagnosis not present

## 2017-03-29 DIAGNOSIS — Z880 Allergy status to penicillin: Secondary | ICD-10-CM | POA: Diagnosis not present

## 2017-03-29 DIAGNOSIS — R51 Headache: Secondary | ICD-10-CM | POA: Diagnosis not present

## 2017-03-30 DIAGNOSIS — N183 Chronic kidney disease, stage 3 (moderate): Secondary | ICD-10-CM | POA: Diagnosis not present

## 2017-03-30 DIAGNOSIS — Z7902 Long term (current) use of antithrombotics/antiplatelets: Secondary | ICD-10-CM | POA: Diagnosis not present

## 2017-03-30 DIAGNOSIS — R531 Weakness: Secondary | ICD-10-CM | POA: Diagnosis not present

## 2017-03-30 DIAGNOSIS — Z79891 Long term (current) use of opiate analgesic: Secondary | ICD-10-CM | POA: Diagnosis not present

## 2017-03-30 DIAGNOSIS — R42 Dizziness and giddiness: Secondary | ICD-10-CM | POA: Diagnosis not present

## 2017-03-30 DIAGNOSIS — Z881 Allergy status to other antibiotic agents status: Secondary | ICD-10-CM | POA: Diagnosis not present

## 2017-03-30 DIAGNOSIS — I482 Chronic atrial fibrillation: Secondary | ICD-10-CM | POA: Diagnosis not present

## 2017-03-30 DIAGNOSIS — E871 Hypo-osmolality and hyponatremia: Secondary | ICD-10-CM | POA: Diagnosis not present

## 2017-03-30 DIAGNOSIS — R1031 Right lower quadrant pain: Secondary | ICD-10-CM | POA: Diagnosis not present

## 2017-03-30 DIAGNOSIS — E876 Hypokalemia: Secondary | ICD-10-CM | POA: Diagnosis not present

## 2017-03-30 DIAGNOSIS — Z880 Allergy status to penicillin: Secondary | ICD-10-CM | POA: Diagnosis not present

## 2017-03-30 DIAGNOSIS — K5900 Constipation, unspecified: Secondary | ICD-10-CM | POA: Diagnosis not present

## 2017-03-30 DIAGNOSIS — R51 Headache: Secondary | ICD-10-CM | POA: Diagnosis not present

## 2017-03-30 DIAGNOSIS — Z888 Allergy status to other drugs, medicaments and biological substances status: Secondary | ICD-10-CM | POA: Diagnosis not present

## 2017-03-30 DIAGNOSIS — I2510 Atherosclerotic heart disease of native coronary artery without angina pectoris: Secondary | ICD-10-CM | POA: Diagnosis not present

## 2017-03-30 DIAGNOSIS — R197 Diarrhea, unspecified: Secondary | ICD-10-CM | POA: Diagnosis not present

## 2017-03-30 DIAGNOSIS — R5381 Other malaise: Secondary | ICD-10-CM | POA: Diagnosis not present

## 2017-03-30 DIAGNOSIS — E860 Dehydration: Secondary | ICD-10-CM | POA: Diagnosis not present

## 2017-03-30 DIAGNOSIS — Z91041 Radiographic dye allergy status: Secondary | ICD-10-CM | POA: Diagnosis not present

## 2017-03-30 DIAGNOSIS — I129 Hypertensive chronic kidney disease with stage 1 through stage 4 chronic kidney disease, or unspecified chronic kidney disease: Secondary | ICD-10-CM | POA: Diagnosis not present

## 2017-03-30 DIAGNOSIS — Z950 Presence of cardiac pacemaker: Secondary | ICD-10-CM | POA: Diagnosis not present

## 2017-03-30 DIAGNOSIS — Z79899 Other long term (current) drug therapy: Secondary | ICD-10-CM | POA: Diagnosis not present

## 2017-03-30 DIAGNOSIS — R1032 Left lower quadrant pain: Secondary | ICD-10-CM | POA: Diagnosis not present

## 2017-03-31 ENCOUNTER — Telehealth: Payer: Self-pay | Admitting: *Deleted

## 2017-03-31 DIAGNOSIS — Z881 Allergy status to other antibiotic agents status: Secondary | ICD-10-CM | POA: Diagnosis not present

## 2017-03-31 DIAGNOSIS — I2510 Atherosclerotic heart disease of native coronary artery without angina pectoris: Secondary | ICD-10-CM | POA: Diagnosis not present

## 2017-03-31 DIAGNOSIS — R2689 Other abnormalities of gait and mobility: Secondary | ICD-10-CM | POA: Diagnosis not present

## 2017-03-31 DIAGNOSIS — D649 Anemia, unspecified: Secondary | ICD-10-CM | POA: Diagnosis not present

## 2017-03-31 DIAGNOSIS — K219 Gastro-esophageal reflux disease without esophagitis: Secondary | ICD-10-CM | POA: Diagnosis not present

## 2017-03-31 DIAGNOSIS — Z882 Allergy status to sulfonamides status: Secondary | ICD-10-CM | POA: Diagnosis not present

## 2017-03-31 DIAGNOSIS — Z7902 Long term (current) use of antithrombotics/antiplatelets: Secondary | ICD-10-CM | POA: Diagnosis not present

## 2017-03-31 DIAGNOSIS — Z79899 Other long term (current) drug therapy: Secondary | ICD-10-CM | POA: Diagnosis not present

## 2017-03-31 DIAGNOSIS — R531 Weakness: Secondary | ICD-10-CM | POA: Diagnosis not present

## 2017-03-31 DIAGNOSIS — E860 Dehydration: Secondary | ICD-10-CM | POA: Diagnosis not present

## 2017-03-31 DIAGNOSIS — K5792 Diverticulitis of intestine, part unspecified, without perforation or abscess without bleeding: Secondary | ICD-10-CM | POA: Diagnosis not present

## 2017-03-31 DIAGNOSIS — I129 Hypertensive chronic kidney disease with stage 1 through stage 4 chronic kidney disease, or unspecified chronic kidney disease: Secondary | ICD-10-CM | POA: Diagnosis not present

## 2017-03-31 DIAGNOSIS — Z888 Allergy status to other drugs, medicaments and biological substances status: Secondary | ICD-10-CM | POA: Diagnosis not present

## 2017-03-31 DIAGNOSIS — E876 Hypokalemia: Secondary | ICD-10-CM | POA: Diagnosis not present

## 2017-03-31 DIAGNOSIS — M810 Age-related osteoporosis without current pathological fracture: Secondary | ICD-10-CM | POA: Diagnosis not present

## 2017-03-31 DIAGNOSIS — R51 Headache: Secondary | ICD-10-CM | POA: Diagnosis not present

## 2017-03-31 DIAGNOSIS — Z91041 Radiographic dye allergy status: Secondary | ICD-10-CM | POA: Diagnosis not present

## 2017-03-31 DIAGNOSIS — I4891 Unspecified atrial fibrillation: Secondary | ICD-10-CM | POA: Diagnosis not present

## 2017-03-31 DIAGNOSIS — R1084 Generalized abdominal pain: Secondary | ICD-10-CM | POA: Diagnosis not present

## 2017-03-31 DIAGNOSIS — Z7901 Long term (current) use of anticoagulants: Secondary | ICD-10-CM | POA: Diagnosis not present

## 2017-03-31 DIAGNOSIS — R1032 Left lower quadrant pain: Secondary | ICD-10-CM | POA: Diagnosis not present

## 2017-03-31 DIAGNOSIS — E871 Hypo-osmolality and hyponatremia: Secondary | ICD-10-CM | POA: Diagnosis not present

## 2017-03-31 DIAGNOSIS — M1990 Unspecified osteoarthritis, unspecified site: Secondary | ICD-10-CM | POA: Diagnosis not present

## 2017-03-31 DIAGNOSIS — Z87891 Personal history of nicotine dependence: Secondary | ICD-10-CM | POA: Diagnosis not present

## 2017-03-31 DIAGNOSIS — R5381 Other malaise: Secondary | ICD-10-CM | POA: Diagnosis not present

## 2017-03-31 DIAGNOSIS — R109 Unspecified abdominal pain: Secondary | ICD-10-CM | POA: Diagnosis not present

## 2017-03-31 DIAGNOSIS — Z950 Presence of cardiac pacemaker: Secondary | ICD-10-CM | POA: Diagnosis not present

## 2017-03-31 DIAGNOSIS — G4700 Insomnia, unspecified: Secondary | ICD-10-CM | POA: Diagnosis not present

## 2017-03-31 DIAGNOSIS — Z880 Allergy status to penicillin: Secondary | ICD-10-CM | POA: Diagnosis not present

## 2017-03-31 DIAGNOSIS — I482 Chronic atrial fibrillation: Secondary | ICD-10-CM | POA: Diagnosis not present

## 2017-03-31 DIAGNOSIS — I10 Essential (primary) hypertension: Secondary | ICD-10-CM | POA: Diagnosis not present

## 2017-03-31 DIAGNOSIS — M489 Spondylopathy, unspecified: Secondary | ICD-10-CM | POA: Diagnosis not present

## 2017-03-31 DIAGNOSIS — I517 Cardiomegaly: Secondary | ICD-10-CM | POA: Diagnosis not present

## 2017-03-31 DIAGNOSIS — N183 Chronic kidney disease, stage 3 (moderate): Secondary | ICD-10-CM | POA: Diagnosis not present

## 2017-03-31 DIAGNOSIS — R42 Dizziness and giddiness: Secondary | ICD-10-CM | POA: Diagnosis not present

## 2017-03-31 DIAGNOSIS — R197 Diarrhea, unspecified: Secondary | ICD-10-CM | POA: Diagnosis not present

## 2017-03-31 DIAGNOSIS — M6281 Muscle weakness (generalized): Secondary | ICD-10-CM | POA: Diagnosis not present

## 2017-03-31 DIAGNOSIS — K5900 Constipation, unspecified: Secondary | ICD-10-CM | POA: Diagnosis not present

## 2017-03-31 DIAGNOSIS — R1031 Right lower quadrant pain: Secondary | ICD-10-CM | POA: Diagnosis not present

## 2017-03-31 DIAGNOSIS — R52 Pain, unspecified: Secondary | ICD-10-CM | POA: Diagnosis not present

## 2017-03-31 DIAGNOSIS — K5780 Diverticulitis of intestine, part unspecified, with perforation and abscess without bleeding: Secondary | ICD-10-CM | POA: Diagnosis not present

## 2017-03-31 DIAGNOSIS — Z79891 Long term (current) use of opiate analgesic: Secondary | ICD-10-CM | POA: Diagnosis not present

## 2017-03-31 NOTE — Telephone Encounter (Signed)
Simona Huh PT with [facility] called to let us know that he has not been able to see patient for PT, she has not been available, she went to [facility] and was admitted on Thanksgiving day. Simona Huh checked his records but patient is currently not at [facility]. He spoke to her daughter who said she spoke to her yesterday and she was at [facility] and was wanting to transfer to [facility] but there is no record that she has been admitted to [facility].

## 2017-04-01 NOTE — Telephone Encounter (Signed)
Letter mailed also for pt to call for appt date and time.

## 2017-04-01 NOTE — Telephone Encounter (Signed)
LMOM to call for date and time.

## 2017-04-02 ENCOUNTER — Telehealth: Payer: Self-pay

## 2017-04-02 DIAGNOSIS — I10 Essential (primary) hypertension: Secondary | ICD-10-CM | POA: Diagnosis not present

## 2017-04-02 DIAGNOSIS — Z87891 Personal history of nicotine dependence: Secondary | ICD-10-CM | POA: Diagnosis not present

## 2017-04-02 DIAGNOSIS — Z79899 Other long term (current) drug therapy: Secondary | ICD-10-CM | POA: Diagnosis not present

## 2017-04-02 DIAGNOSIS — K5792 Diverticulitis of intestine, part unspecified, without perforation or abscess without bleeding: Secondary | ICD-10-CM | POA: Diagnosis not present

## 2017-04-02 DIAGNOSIS — Z7901 Long term (current) use of anticoagulants: Secondary | ICD-10-CM | POA: Diagnosis not present

## 2017-04-02 DIAGNOSIS — K5780 Diverticulitis of intestine, part unspecified, with perforation and abscess without bleeding: Secondary | ICD-10-CM | POA: Diagnosis not present

## 2017-04-02 DIAGNOSIS — M810 Age-related osteoporosis without current pathological fracture: Secondary | ICD-10-CM | POA: Diagnosis not present

## 2017-04-02 DIAGNOSIS — I517 Cardiomegaly: Secondary | ICD-10-CM | POA: Diagnosis not present

## 2017-04-02 DIAGNOSIS — R109 Unspecified abdominal pain: Secondary | ICD-10-CM | POA: Diagnosis not present

## 2017-04-02 DIAGNOSIS — I4891 Unspecified atrial fibrillation: Secondary | ICD-10-CM | POA: Diagnosis not present

## 2017-04-02 DIAGNOSIS — M1990 Unspecified osteoarthritis, unspecified site: Secondary | ICD-10-CM | POA: Diagnosis not present

## 2017-04-02 NOTE — Telephone Encounter (Signed)
[place].  Will keep our eyes open for her [facility]

## 2017-04-02 NOTE — Telephone Encounter (Signed)
Pt called she states that she is in [facility] and she states that she needs Dr Sallyanne Kuster to tell them to discharge her and she needs to be re-admitted here at [facility]. She also states that they changed her lasix and she is asking if they "called and asked if they could do this" informed pt that hospitalist is a doctor and does not have to call and ask [place] to adjust her medication and also we do not tell them to discharge a pt and she will have to talk to the hospitalist that is taking care of her at [facility] is the one to do this. She states that he will not and she will sign out AMA and come to [facility]. Re-iterated to pt that this is her decision to leave AMA she verbalizes understanding.

## 2017-04-02 NOTE — Telephone Encounter (Signed)
[employer] notified

## 2017-04-03 DIAGNOSIS — R2689 Other abnormalities of gait and mobility: Secondary | ICD-10-CM | POA: Diagnosis not present

## 2017-04-03 DIAGNOSIS — E876 Hypokalemia: Secondary | ICD-10-CM | POA: Diagnosis not present

## 2017-04-03 DIAGNOSIS — R51 Headache: Secondary | ICD-10-CM | POA: Diagnosis not present

## 2017-04-03 DIAGNOSIS — I442 Atrioventricular block, complete: Secondary | ICD-10-CM | POA: Diagnosis not present

## 2017-04-03 DIAGNOSIS — K5792 Diverticulitis of intestine, part unspecified, without perforation or abscess without bleeding: Secondary | ICD-10-CM | POA: Diagnosis not present

## 2017-04-03 DIAGNOSIS — M489 Spondylopathy, unspecified: Secondary | ICD-10-CM | POA: Diagnosis not present

## 2017-04-03 DIAGNOSIS — Z888 Allergy status to other drugs, medicaments and biological substances status: Secondary | ICD-10-CM | POA: Diagnosis not present

## 2017-04-03 DIAGNOSIS — I2510 Atherosclerotic heart disease of native coronary artery without angina pectoris: Secondary | ICD-10-CM | POA: Diagnosis not present

## 2017-04-03 DIAGNOSIS — R5381 Other malaise: Secondary | ICD-10-CM | POA: Diagnosis not present

## 2017-04-03 DIAGNOSIS — Z91041 Radiographic dye allergy status: Secondary | ICD-10-CM | POA: Diagnosis not present

## 2017-04-03 DIAGNOSIS — Z882 Allergy status to sulfonamides status: Secondary | ICD-10-CM | POA: Diagnosis not present

## 2017-04-03 DIAGNOSIS — N183 Chronic kidney disease, stage 3 (moderate): Secondary | ICD-10-CM | POA: Diagnosis not present

## 2017-04-03 DIAGNOSIS — Z743 Need for continuous supervision: Secondary | ICD-10-CM | POA: Diagnosis not present

## 2017-04-03 DIAGNOSIS — Z880 Allergy status to penicillin: Secondary | ICD-10-CM | POA: Diagnosis not present

## 2017-04-03 DIAGNOSIS — K219 Gastro-esophageal reflux disease without esophagitis: Secondary | ICD-10-CM | POA: Diagnosis not present

## 2017-04-03 DIAGNOSIS — R279 Unspecified lack of coordination: Secondary | ICD-10-CM | POA: Diagnosis not present

## 2017-04-03 DIAGNOSIS — D649 Anemia, unspecified: Secondary | ICD-10-CM | POA: Diagnosis not present

## 2017-04-03 DIAGNOSIS — G4700 Insomnia, unspecified: Secondary | ICD-10-CM | POA: Diagnosis not present

## 2017-04-03 DIAGNOSIS — I482 Chronic atrial fibrillation: Secondary | ICD-10-CM | POA: Diagnosis not present

## 2017-04-03 DIAGNOSIS — M6281 Muscle weakness (generalized): Secondary | ICD-10-CM | POA: Diagnosis not present

## 2017-04-06 ENCOUNTER — Ambulatory Visit (INDEPENDENT_AMBULATORY_CARE_PROVIDER_SITE_OTHER): Payer: Medicare Other | Admitting: *Deleted

## 2017-04-06 DIAGNOSIS — I442 Atrioventricular block, complete: Secondary | ICD-10-CM | POA: Diagnosis not present

## 2017-04-07 ENCOUNTER — Encounter: Payer: Self-pay | Admitting: Cardiology

## 2017-04-07 LAB — CUP PACEART REMOTE DEVICE CHECK
Battery Remaining Longevity: 32 mo
Brady Statistic AP VP Percent: 0 %
Brady Statistic RV Percent Paced: 99.37 %
Date Time Interrogation Session: 20181204040703
Implantable Lead Implant Date: 19971124
Implantable Lead Location: 753860
Implantable Lead Model: 4024
Implantable Lead Model: 4194
Lead Channel Impedance Value: 361 Ohm
Lead Channel Impedance Value: 4047 Ohm
Lead Channel Impedance Value: 4047 Ohm
Lead Channel Impedance Value: 418 Ohm
Lead Channel Impedance Value: 456 Ohm
Lead Channel Impedance Value: 513 Ohm
Lead Channel Impedance Value: 532 Ohm
Lead Channel Impedance Value: 608 Ohm
Lead Channel Pacing Threshold Amplitude: 0.5 V
Lead Channel Pacing Threshold Amplitude: 1.625 V
Lead Channel Pacing Threshold Pulse Width: 1 ms
Lead Channel Sensing Intrinsic Amplitude: 11.625 mV
Lead Channel Setting Sensing Sensitivity: 2.8 mV
MDC IDC LEAD IMPLANT DT: 20070831
MDC IDC LEAD LOCATION: 753858
MDC IDC MSMT BATTERY VOLTAGE: 2.93 V
MDC IDC MSMT LEADCHNL LV IMPEDANCE VALUE: 722 Ohm
MDC IDC MSMT LEADCHNL RV PACING THRESHOLD PULSEWIDTH: 0.4 ms
MDC IDC MSMT LEADCHNL RV SENSING INTR AMPL: 11.625 mV
MDC IDC PG IMPLANT DT: 20120619
MDC IDC SET LEADCHNL LV PACING AMPLITUDE: 2.75 V
MDC IDC SET LEADCHNL LV PACING PULSEWIDTH: 1 ms
MDC IDC SET LEADCHNL RV PACING AMPLITUDE: 2 V
MDC IDC SET LEADCHNL RV PACING PULSEWIDTH: 0.4 ms
MDC IDC STAT BRADY AP VS PERCENT: 0 %
MDC IDC STAT BRADY AS VP PERCENT: 93.75 %
MDC IDC STAT BRADY AS VS PERCENT: 6.25 %
MDC IDC STAT BRADY RA PERCENT PACED: 0 %

## 2017-04-07 NOTE — Progress Notes (Signed)
Remote pacemaker transmission.   

## 2017-04-08 ENCOUNTER — Ambulatory Visit: Payer: Medicare Other | Admitting: Family Medicine

## 2017-04-21 ENCOUNTER — Telehealth: Payer: Self-pay | Admitting: Family Medicine

## 2017-04-21 NOTE — Telephone Encounter (Signed)
Patient has a follow up appointment scheduled. 

## 2017-04-23 ENCOUNTER — Ambulatory Visit: Payer: Medicare Other | Admitting: Family Medicine

## 2017-04-23 DIAGNOSIS — I5032 Chronic diastolic (congestive) heart failure: Secondary | ICD-10-CM | POA: Diagnosis not present

## 2017-04-23 DIAGNOSIS — I130 Hypertensive heart and chronic kidney disease with heart failure and stage 1 through stage 4 chronic kidney disease, or unspecified chronic kidney disease: Secondary | ICD-10-CM | POA: Diagnosis not present

## 2017-04-23 DIAGNOSIS — N183 Chronic kidney disease, stage 3 (moderate): Secondary | ICD-10-CM | POA: Diagnosis not present

## 2017-04-23 DIAGNOSIS — M797 Fibromyalgia: Secondary | ICD-10-CM | POA: Diagnosis not present

## 2017-04-23 DIAGNOSIS — M5137 Other intervertebral disc degeneration, lumbosacral region: Secondary | ICD-10-CM | POA: Diagnosis not present

## 2017-04-29 ENCOUNTER — Encounter: Payer: Self-pay | Admitting: Cardiovascular Disease

## 2017-04-29 ENCOUNTER — Ambulatory Visit: Payer: Medicare Other | Admitting: Cardiovascular Disease

## 2017-04-29 VITALS — BP 126/68 | HR 70 | Ht 66.0 in | Wt 144.6 lb

## 2017-04-29 DIAGNOSIS — I442 Atrioventricular block, complete: Secondary | ICD-10-CM

## 2017-04-29 DIAGNOSIS — I482 Chronic atrial fibrillation: Secondary | ICD-10-CM | POA: Diagnosis not present

## 2017-04-29 DIAGNOSIS — I4821 Permanent atrial fibrillation: Secondary | ICD-10-CM

## 2017-04-29 DIAGNOSIS — Z950 Presence of cardiac pacemaker: Secondary | ICD-10-CM | POA: Diagnosis not present

## 2017-04-29 DIAGNOSIS — I5032 Chronic diastolic (congestive) heart failure: Secondary | ICD-10-CM | POA: Diagnosis not present

## 2017-04-29 NOTE — Progress Notes (Signed)
Cardiology Office Note    Date:  04/29/2017   ID:  Kimberly [name], DOB 1926-06-16, MRN [id number]  PCP:  Dettinger, Kimberly Kaufmann, MD  Cardiologist:   Sanda Klein, MD   Chief Complaint  Patient presents with  . Follow-up    pt reports no complaints    History of Present Illness:  Kimberly [name] is a 83 y.o. female with a history of permanent atrial fibrillation with AV node ablation and secondary complete heart block here for follow-up on her CRT-P device. She has mild diastolic heart failure.  He was hospitalized for enterotoxigenic E. Coli in November.  During that period of time her pacemaker shows sudden marked decrease in activity level and increase in Optivol (fluid overload).  Both those values have subsequently returned to normal.  The patient specifically denies any chest pain at rest exertion, dyspnea at rest or with exertion, orthopnea, paroxysmal nocturnal dyspnea, syncope, palpitations, focal neurological deficits, intermittent claudication, lower extremity edema, unexplained weight gain, cough, hemoptysis or wheezing.  Pacemaker interrogation shows normal device function. She has 96% biventricular pacing.  Estimated generator longevity is 2.5 years.  Her current generator was implanted in 2012, the right ventricular lead is from 1997 and the coronary sinus lead is from 2007.  The atrial port is pinned.  Despite this there is 7% "atrial sensing", not sure how to explain this: have asked her to call tech services.  Her most recent echocardiogram performed July 2016 showed left ventricular ejection fraction 60-65 %, severe biatrial dilatation, minor valvular abnormalities, peak PA systolic pressure 39 mmHg.  [name] has a long-standing history of permanent atrial fibrillation s/p AV node ablation, complete heart block and nonischemic cardiomyopathy with an excellent response to cardiac resynchronization therapy. She originally had a CRT-D device but at the time of generator  change out was "downgraded" to a CRT-P. since her left ventricular ejection fraction increased to 50-55%. Her current device is a Risk analyst CRT-P [id number] implanted in June 2012. Her defibrillator lead, which was a [employer] fidelis lead is capped and an older RV pacemaker lead Medtronic [id number] implanted 1997 is being used. The left ventricular lead is a Medtronic [id number] implanted in 2007. She has long-standing problems with chronic scoliosis and fibromyalgia.  In December 2015 she was hospitalized in [place] and received 2 units of blood transfusion. No bleeding events since that time. Hospitalized in January 2016 after a fall at home,  Possibly precipitated by dehydration complicated by acute on chronic renal failure.   Past Medical History:  Diagnosis Date  . Acute blood loss anemia 10/11/2012  . Acute diverticulitis 08/24/2013  . Acute on chronic combined systolic and diastolic CHF, NYHA class 4 ([place]) 11/15/2013  . Antral ulcer 10/11/2012  . Arrhythmia    atrial fibb  . Atrial fibrillation ([place])   . Cardiomyopathy, nonischemic ([place])   . Chronic anticoagulation 10/12/2012  . CKD (chronic kidney disease) stage 3, GFR 30-59 ml/min (H[facility]) 10/12/2012  . Depression   . Erosive esophagitis 10/11/2012  . Fibromyalgia   . Glaucoma   . H/O echocardiogram 2007   EF 40-45%,         . Hypertension   . Osteoarthritis   . Pacemaker    Last saw cards 07/2013  . Scoliosis     Past Surgical History:  Procedure Laterality Date  . ABDOMINAL HYSTERECTOMY    . APPENDECTOMY    . BACK SURGERY    . BIOPSY  03/03/2017   Procedure: BIOPSY;  Surgeon: Kimberly Binder, MD;  Location: [facility];  Service: Endoscopy;;  gastric  . BREAST SURGERY    . CARDIAC CATHETERIZATION  12/08/2005   LAD AND LEFT MAIN WITH NO HIGH-GRADE STENOSIS. MILD DISEASE IN THE CX AND LAD SYSTEM. SEVERE LV DYSFUNCTION WITH DILATION OF THE LV. EF 15-20%. LV END-DIASTOLIC PRESSURE IS 90. +1 MR.  . CHOLECYSTECTOMY    . COLONOSCOPY N/A  03/03/2017   Procedure: COLONOSCOPY;  Surgeon: Kimberly Binder, MD;  Location[facility];  Service: Endoscopy;  Laterality: N/A;  . CYSTOSCOPY N/A 02/24/2013   Procedure: CYSTOSCOPY WITH URETHRAL DILITATION;  Surgeon: Kimberly Nestle, MD;  Location: [facility];  Service: Urology;  Laterality: N/A;  . DOPPLER ECHOCARDIOGRAPHY N/A 05/30/2010   LV SIZE IS NORMAL. LV SYSTOLIC FUNCTION IS LOW NORMAL. EF=50-55%. MILD INFERIOR HYPOKINESIS.MILD TO MODERATE POSTERIOR WALL HYPOKINESIS.PACEMAKER LEAD IN THE RV. LA IS MILDLY DILATED. RA IS MODERATE TO SEVERLY DILATED. PACEMAKER LEAD IN THE RA. MILD CALCICICATION OF THE MV APPARATUS. MODERATE MR. MILD TO MODERATE TR. MILD PHTN.AV MILDLY SCLEROTIC.  Kimberly [name] ESOPHAGOGASTRODUODENOSCOPY N/A 10/13/2012   Dr. Gala [name]: severe ulcerative reflux esophagitis, question of Barrett's but negative path, single deep prepyloric antral ulcer, negative H.pylori  . ESOPHAGOGASTRODUODENOSCOPY N/A 03/03/2017   Procedure: ESOPHAGOGASTRODUODENOSCOPY (EGD);  Surgeon: Kimberly Binder, MD;  [facility];  Service: Endoscopy;  Laterality: N/A;  . HERNIA REPAIR     right inguinal hernia and umbilical  . LOWETR EXT VENOUS Bilateral 11-08-10   R & L- NO EVIDENCE OF THROMBUS OR THROMBOPHLEBITIS. THERE IS MILD AMOUNT OF SUBCUTANEOUS EDEMA NOTED WITHIN THE LEFT CALF AND ANKLE. R & L GSV AND SSV- NO VENOUS INSUFF NOTED.  Kimberly [name] NECK SURGERY    . NUCLEAR STRESS TEST N/A 02/13/2009   NORMAL PATTERN OF PERFUSION IN ALL REGIONS. POST STRESS VENTICULAR SIZE IS NORMAL. POST  STESS EF 85%.  NORMAL MYOCARDIAL PERFUSION STUDY.  Kimberly [name] PACEMAKER INSERTION    . POLYPECTOMY  03/03/2017   Procedure: POLYPECTOMY;  Surgeon: Kimberly Binder, MD;  [facility];  Service: Endoscopy;;  colon  . TONSILLECTOMY    . YAG LASER APPLICATION Bilateral 0/97/3532   Procedure: YAG LASER APPLICATION;  Surgeon: Kimberly Che, MD;  Location: [facility];  Service: Ophthalmology;  Laterality: Bilateral;    Current  Medications: Outpatient Medications Prior to Visit  Medication Sig Dispense Refill  . acetaminophen (TYLENOL) 500 MG tablet Take 1,000 mg every 6 (six) hours as needed by mouth for headache (pain).     . carvedilol (COREG) 6.25 MG tablet TAKE 1 TABLET BY MOUTH TWO  TIMES DAILY WITH A MEAL 180 tablet 0  . ELIQUIS 2.5 MG TABS tablet TAKE 1 TABLET BY MOUTH  TWICE A DAY 180 tablet 1  . furosemide (LASIX) 20 MG tablet Take 1 tablet (20 mg total) by mouth daily. 90 tablet 3  . loperamide (IMODIUM A-D) 2 MG tablet Take 2 mg 4 (four) times daily as needed by mouth for diarrhea or loose stools.    . Multiple Vitamins-Minerals (PRESERVISION AREDS 2) CAPS Take 1 capsule daily by mouth.    . pantoprazole (PROTONIX) 40 MG tablet Take 40 mg daily by mouth.    Kimberly [name] PARoxetine (PAXIL) 20 MG tablet Take 1 tablet (20 mg total) by mouth daily. 30 tablet 2  . Polyethyl Glycol-Propyl Glycol (SYSTANE ULTRA) 0.4-0.3 % SOLN Place 1-2 drops 2 (two) times daily as needed into both eyes (dry eyes).     Kimberly [name]  potassium chloride SA (K-DUR,KLOR-CON) 20 MEQ tablet Take 1 tablet (20 mEq total) by mouth 2 (two) times daily. 60 tablet 0  . Probiotic Product (PROBIOTIC COLON SUPPORT) CAPS Take 1 capsule 2 (two) times daily with a meal by mouth.    . sodium chloride (OCEAN) 0.65 % SOLN nasal spray Place 1 spray 2 (two) times daily as needed into both nostrils for congestion.     . traZODone (DESYREL) 50 MG tablet Take 0.5-2 tablets (25-100 mg total) at bedtime as needed by mouth for sleep. (Patient taking differently: Take 100 mg at bedtime by mouth. ) 60 tablet 3   No facility-administered medications prior to visit.      Allergies:   Ciprofloxacin; Flagyl [metronidazole]; Penicillins; and Sulfa antibiotics   Social History   Socioeconomic History  . Marital status: Widowed    Spouse name: Not on file  . Number of children: Not on file  . Years of education: Not on file  . Highest education level: Not on file  Social Needs  .  Financial resource strain: Not on file  . Food insecurity - worry: Not on file  . Food insecurity - inability: Not on file  . Transportation needs - medical: Not on file  . Transportation needs - non-medical: Not on file  Occupational History  . Occupation: IT trainer: RETIRED    Comment: retired  Tobacco Use  . Smoking status: Former Smoker    Packs/day: 0.50    Years: 30.00    Pack years: 15.00    Types: Cigarettes    Last attempt to quit: 12/13/2004    Years since quitting: 12.3  . Smokeless tobacco: Never Used  . Tobacco comment: former smoker  Substance and Sexual Activity  . Alcohol use: Yes    Alcohol/week: 0.0 oz    Comment: history of drinking a 1/2 glass of wine in the evening  . Drug use: No  . Sexual activity: No  Other Topics Concern  . Not on file  Social History Narrative  . Not on file     Family History:  The patient's family history includes Asthma in her sister; Cancer in her brother and sister; Heart failure in her brother; Pulmonary embolism in her brother.   ROS:   Please see the history of present illness.    ROS All other systems reviewed and are negative.   PHYSICAL EXAM:   VS:  BP 126/68   Pulse 70   Ht 5\' 6"  (1.676 m)   Wt 144 lb 9.6 oz (65.6 kg)   BMI 23.34 kg/m     General: Alert, oriented x3, no distress, appears comfortable Head: no evidence of trauma, PERRL, EOMI, no exophtalmos or lid lag, no myxedema, no xanthelasma; normal ears, nose and oropharynx Neck: normal jugular venous pulsations and no hepatojugular reflux; brisk carotid pulses without delay and no carotid bruits Chest: clear to auscultation, no signs of consolidation by percussion or palpation, normal fremitus, symmetrical and full respiratory excursions Cardiovascular: normal position and quality of the apical impulse, regular rhythm, normal first and paradoxically split second heart sounds, no murmurs, rubs or gallops.  Healthy subclavian pacemaker  site Abdomen: no tenderness or distention, no masses by palpation, no abnormal pulsatility or arterial bruits, normal bowel sounds, no hepatosplenomegaly Extremities: no clubbing, cyanosis or edema; 2+ radial, ulnar and brachial pulses bilaterally; 2+ right femoral, posterior tibial and dorsalis pedis pulses; 2+ left femoral, posterior tibial and dorsalis pedis pulses; no subclavian or  femoral bruits Neurological: grossly nonfocal Psych: Normal mood and affect   Wt Readings from Last 3 Encounters:  04/29/17 144 lb 9.6 oz (65.6 kg)  03/25/17 152 lb 3.2 oz (69 kg)  03/24/17 137 lb (62.1 kg)      Studies/Labs Reviewed:   EKG:  EKG is not ordered today.  ECG March 23, 2017 shows atrial fibrillation with regular biventricular pacing and R wave in lead V1 consistent with effective left ventricular activation.  Recent Labs: 03/21/2017: ALT 9 03/22/2017: Magnesium 1.5 03/23/2017: BUN 14; Creatinine, Ser 1.41; Hemoglobin 10.6; Platelets 204; Potassium 4.4; Sodium 133   Lipid Panel    Component Value Date/Time   CHOL 154 02/26/2017 1509   TRIG 72 02/26/2017 1509   HDL 66 02/26/2017 1509   CHOLHDL 2.3 02/26/2017 1509   CHOLHDL 2.3 02/06/2009 0450   VLDL 16 02/06/2009 0450   LDLCALC 74 02/26/2017 1509    ASSESSMENT:    1. Permanent atrial fibrillation ([place])   2. Complete heart block ([place])   3. Biventricular cardiac pacemaker in situ   4. Chronic diastolic heart failure (HCC)      PLAN:  In order of problems listed above:  1. AFib s/p AV node ablation: Well-tolerated anticoagulation, no serious bleeding complications.  Anticoagulant dose adjusted for age and small body size. 2. CHB: Pacemaker dependent after AV node ablation 3. CRT-P: Normal device function with fair biventricular pacing percentage. Remote download every 3 months and yearly office visit 4. CHF: CRT hyper responder with recovered left ventricular ejection fraction, most recently estimated at 60%. She requires a  very low dose of loop diuretic therapy.  Recent drop in thoracic impedance related probably to intravenous fluid administration during her hospitalization, back to baseline.    Medication Adjustments/Labs and Tests Ordered: Current medicines are reviewed at length with the patient today.  Concerns regarding medicines are outlined above.  Medication changes, Labs and Tests ordered today are listed in the Patient Instructions below. Patient Instructions  Dr Sallyanne Kuster recommends that you continue on your current medications as directed. Please refer to the Current Medication list given to you today.  Remote monitoring is used to monitor your Pacemaker or ICD from home. This monitoring reduces the number of office visits required to check your device to one time per year. It allows [place] to keep an eye on the functioning of your device to ensure it is working properly. You are scheduled for a device check from home on Monday, March 4th, 2019. You may send your transmission at any time that day. If you have a wireless device, the transmission will be sent automatically. After your physician reviews your transmission, you will receive a notification with your next transmission date.  Dr Sallyanne Kuster recommends that you schedule a follow-up appointment in 12 months with a pacemaker check. You will receive a reminder letter in the mail two months in advance. If you don't receive a letter, please call our office to schedule the follow-up appointment.  If you need a refill on your cardiac medications before your next appointment, please call your pharmacy.     Signed, Sanda Klein, MD  04/29/2017 10:35 PM    [facility] [address], [place], [place]  [address] Phone: [phone]; Fax: [phone]

## 2017-04-29 NOTE — Patient Instructions (Addendum)

## 2017-04-30 DIAGNOSIS — N183 Chronic kidney disease, stage 3 (moderate): Secondary | ICD-10-CM | POA: Diagnosis not present

## 2017-04-30 DIAGNOSIS — M5137 Other intervertebral disc degeneration, lumbosacral region: Secondary | ICD-10-CM | POA: Diagnosis not present

## 2017-04-30 DIAGNOSIS — M797 Fibromyalgia: Secondary | ICD-10-CM | POA: Diagnosis not present

## 2017-04-30 DIAGNOSIS — I5032 Chronic diastolic (congestive) heart failure: Secondary | ICD-10-CM | POA: Diagnosis not present

## 2017-04-30 DIAGNOSIS — I130 Hypertensive heart and chronic kidney disease with heart failure and stage 1 through stage 4 chronic kidney disease, or unspecified chronic kidney disease: Secondary | ICD-10-CM | POA: Diagnosis not present

## 2017-05-01 DIAGNOSIS — Z7901 Long term (current) use of anticoagulants: Secondary | ICD-10-CM | POA: Diagnosis not present

## 2017-05-01 DIAGNOSIS — M797 Fibromyalgia: Secondary | ICD-10-CM | POA: Diagnosis not present

## 2017-05-01 DIAGNOSIS — I4891 Unspecified atrial fibrillation: Secondary | ICD-10-CM | POA: Diagnosis not present

## 2017-05-01 DIAGNOSIS — M5137 Other intervertebral disc degeneration, lumbosacral region: Secondary | ICD-10-CM | POA: Diagnosis not present

## 2017-05-01 DIAGNOSIS — Z87891 Personal history of nicotine dependence: Secondary | ICD-10-CM | POA: Diagnosis not present

## 2017-05-01 DIAGNOSIS — I130 Hypertensive heart and chronic kidney disease with heart failure and stage 1 through stage 4 chronic kidney disease, or unspecified chronic kidney disease: Secondary | ICD-10-CM | POA: Diagnosis not present

## 2017-05-01 DIAGNOSIS — M1990 Unspecified osteoarthritis, unspecified site: Secondary | ICD-10-CM | POA: Diagnosis not present

## 2017-05-01 DIAGNOSIS — E876 Hypokalemia: Secondary | ICD-10-CM | POA: Diagnosis not present

## 2017-05-01 DIAGNOSIS — I5032 Chronic diastolic (congestive) heart failure: Secondary | ICD-10-CM | POA: Diagnosis not present

## 2017-05-01 DIAGNOSIS — M810 Age-related osteoporosis without current pathological fracture: Secondary | ICD-10-CM | POA: Diagnosis not present

## 2017-05-01 DIAGNOSIS — N183 Chronic kidney disease, stage 3 (moderate): Secondary | ICD-10-CM | POA: Diagnosis not present

## 2017-05-01 DIAGNOSIS — I10 Essential (primary) hypertension: Secondary | ICD-10-CM | POA: Diagnosis not present

## 2017-05-01 DIAGNOSIS — N390 Urinary tract infection, site not specified: Secondary | ICD-10-CM | POA: Diagnosis not present

## 2017-05-01 DIAGNOSIS — M546 Pain in thoracic spine: Secondary | ICD-10-CM | POA: Diagnosis not present

## 2017-05-07 ENCOUNTER — Ambulatory Visit: Payer: Medicare Other | Admitting: Family Medicine

## 2017-05-07 DIAGNOSIS — N183 Chronic kidney disease, stage 3 (moderate): Secondary | ICD-10-CM | POA: Diagnosis not present

## 2017-05-07 DIAGNOSIS — M5137 Other intervertebral disc degeneration, lumbosacral region: Secondary | ICD-10-CM | POA: Diagnosis not present

## 2017-05-07 DIAGNOSIS — I130 Hypertensive heart and chronic kidney disease with heart failure and stage 1 through stage 4 chronic kidney disease, or unspecified chronic kidney disease: Secondary | ICD-10-CM | POA: Diagnosis not present

## 2017-05-07 DIAGNOSIS — I5032 Chronic diastolic (congestive) heart failure: Secondary | ICD-10-CM | POA: Diagnosis not present

## 2017-05-07 DIAGNOSIS — M797 Fibromyalgia: Secondary | ICD-10-CM | POA: Diagnosis not present

## 2017-05-08 ENCOUNTER — Telehealth: Payer: Self-pay | Admitting: *Deleted

## 2017-05-08 ENCOUNTER — Telehealth: Payer: Self-pay

## 2017-05-08 NOTE — Telephone Encounter (Signed)
Patient returned call to office in regards to bedside commode. Began to ask patient questions from form and she states that she already has a bedside commode and does not need another one. Advised that if we needed further information we would call her back. Patient verbalized understanding.

## 2017-05-08 NOTE — Telephone Encounter (Signed)
lmtcb form to fill out for [place] for bedside commode Questions on form we have in order to fill out Is pt confined to a single room, confined to one level where no toilet available on that level, confined to home where there are toilet facilities in home, commode chair with detachable arms necessary to transfer.

## 2017-05-13 ENCOUNTER — Other Ambulatory Visit: Payer: Self-pay | Admitting: Cardiovascular Disease

## 2017-05-13 DIAGNOSIS — I5043 Acute on chronic combined systolic (congestive) and diastolic (congestive) heart failure: Secondary | ICD-10-CM

## 2017-05-13 DIAGNOSIS — Z950 Presence of cardiac pacemaker: Secondary | ICD-10-CM

## 2017-05-13 NOTE — Telephone Encounter (Signed)
Scr =  1.58 on 03/22/2017 (fluctuating)  Appropriate to continue low dose Eliquis

## 2017-05-14 DIAGNOSIS — N183 Chronic kidney disease, stage 3 (moderate): Secondary | ICD-10-CM | POA: Diagnosis not present

## 2017-05-14 DIAGNOSIS — M797 Fibromyalgia: Secondary | ICD-10-CM | POA: Diagnosis not present

## 2017-05-14 DIAGNOSIS — I5043 Acute on chronic combined systolic (congestive) and diastolic (congestive) heart failure: Secondary | ICD-10-CM | POA: Diagnosis not present

## 2017-05-14 DIAGNOSIS — I130 Hypertensive heart and chronic kidney disease with heart failure and stage 1 through stage 4 chronic kidney disease, or unspecified chronic kidney disease: Secondary | ICD-10-CM | POA: Diagnosis not present

## 2017-05-14 DIAGNOSIS — M5137 Other intervertebral disc degeneration, lumbosacral region: Secondary | ICD-10-CM | POA: Diagnosis not present

## 2017-05-18 ENCOUNTER — Ambulatory Visit (INDEPENDENT_AMBULATORY_CARE_PROVIDER_SITE_OTHER): Payer: Medicare Other

## 2017-05-18 DIAGNOSIS — M810 Age-related osteoporosis without current pathological fracture: Secondary | ICD-10-CM

## 2017-05-18 DIAGNOSIS — M109 Gout, unspecified: Secondary | ICD-10-CM | POA: Diagnosis not present

## 2017-05-18 DIAGNOSIS — I130 Hypertensive heart and chronic kidney disease with heart failure and stage 1 through stage 4 chronic kidney disease, or unspecified chronic kidney disease: Secondary | ICD-10-CM | POA: Diagnosis not present

## 2017-05-18 DIAGNOSIS — M797 Fibromyalgia: Secondary | ICD-10-CM | POA: Diagnosis not present

## 2017-05-18 DIAGNOSIS — M5135 Other intervertebral disc degeneration, thoracolumbar region: Secondary | ICD-10-CM | POA: Diagnosis not present

## 2017-05-18 DIAGNOSIS — N183 Chronic kidney disease, stage 3 (moderate): Secondary | ICD-10-CM | POA: Diagnosis not present

## 2017-05-18 DIAGNOSIS — F319 Bipolar disorder, unspecified: Secondary | ICD-10-CM | POA: Diagnosis not present

## 2017-05-18 DIAGNOSIS — M1991 Primary osteoarthritis, unspecified site: Secondary | ICD-10-CM

## 2017-05-18 DIAGNOSIS — M5137 Other intervertebral disc degeneration, lumbosacral region: Secondary | ICD-10-CM

## 2017-05-18 DIAGNOSIS — I5043 Acute on chronic combined systolic (congestive) and diastolic (congestive) heart failure: Secondary | ICD-10-CM

## 2017-05-18 DIAGNOSIS — I482 Chronic atrial fibrillation: Secondary | ICD-10-CM | POA: Diagnosis not present

## 2017-05-19 DIAGNOSIS — I130 Hypertensive heart and chronic kidney disease with heart failure and stage 1 through stage 4 chronic kidney disease, or unspecified chronic kidney disease: Secondary | ICD-10-CM | POA: Diagnosis not present

## 2017-05-19 DIAGNOSIS — N183 Chronic kidney disease, stage 3 (moderate): Secondary | ICD-10-CM | POA: Diagnosis not present

## 2017-05-19 DIAGNOSIS — I5043 Acute on chronic combined systolic (congestive) and diastolic (congestive) heart failure: Secondary | ICD-10-CM | POA: Diagnosis not present

## 2017-05-19 DIAGNOSIS — M5137 Other intervertebral disc degeneration, lumbosacral region: Secondary | ICD-10-CM | POA: Diagnosis not present

## 2017-05-19 DIAGNOSIS — M797 Fibromyalgia: Secondary | ICD-10-CM | POA: Diagnosis not present

## 2017-06-15 LAB — CUP PACEART INCLINIC DEVICE CHECK
Implantable Lead Implant Date: 19971124
Implantable Lead Implant Date: 20070831
Implantable Lead Location: 753860
Implantable Lead Model: 4024
Lead Channel Setting Pacing Amplitude: 2.75 V
Lead Channel Setting Sensing Sensitivity: 2.8 mV
MDC IDC LEAD LOCATION: 753858
MDC IDC PG IMPLANT DT: 20120619
MDC IDC SESS DTM: 20190211163950
MDC IDC SET LEADCHNL LV PACING PULSEWIDTH: 1 ms
MDC IDC SET LEADCHNL RV PACING AMPLITUDE: 2 V
MDC IDC SET LEADCHNL RV PACING PULSEWIDTH: 0.4 ms

## 2017-07-06 ENCOUNTER — Telehealth: Payer: Self-pay | Admitting: Cardiology

## 2017-07-06 ENCOUNTER — Ambulatory Visit (INDEPENDENT_AMBULATORY_CARE_PROVIDER_SITE_OTHER): Payer: Medicare Other | Admitting: *Deleted

## 2017-07-06 DIAGNOSIS — I442 Atrioventricular block, complete: Secondary | ICD-10-CM

## 2017-07-06 NOTE — Telephone Encounter (Signed)
Spoke with pt and reminded pt of remote transmission that is due today. Pt verbalized understanding.   

## 2017-07-07 LAB — CUP PACEART REMOTE DEVICE CHECK
Battery Remaining Longevity: 32 mo
Brady Statistic AP VP Percent: 0 %
Brady Statistic AS VP Percent: 87.47 %
Brady Statistic AS VS Percent: 12.53 %
Date Time Interrogation Session: 20190305030837
Implantable Lead Implant Date: 20070831
Implantable Lead Location: 753860
Implantable Pulse Generator Implant Date: 20120619
Lead Channel Impedance Value: 4047 Ohm
Lead Channel Impedance Value: 532 Ohm
Lead Channel Impedance Value: 551 Ohm
Lead Channel Impedance Value: 722 Ohm
Lead Channel Pacing Threshold Amplitude: 0.625 V
Lead Channel Pacing Threshold Amplitude: 1.125 V
Lead Channel Sensing Intrinsic Amplitude: 11.625 mV
Lead Channel Setting Pacing Amplitude: 2 V
Lead Channel Setting Pacing Pulse Width: 0.4 ms
Lead Channel Setting Sensing Sensitivity: 2.8 mV
MDC IDC LEAD IMPLANT DT: 19971124
MDC IDC LEAD LOCATION: 753858
MDC IDC MSMT BATTERY VOLTAGE: 2.94 V
MDC IDC MSMT LEADCHNL LV IMPEDANCE VALUE: 494 Ohm
MDC IDC MSMT LEADCHNL LV IMPEDANCE VALUE: 665 Ohm
MDC IDC MSMT LEADCHNL LV IMPEDANCE VALUE: 950 Ohm
MDC IDC MSMT LEADCHNL LV PACING THRESHOLD PULSEWIDTH: 1 ms
MDC IDC MSMT LEADCHNL RA IMPEDANCE VALUE: 4047 Ohm
MDC IDC MSMT LEADCHNL RV IMPEDANCE VALUE: 551 Ohm
MDC IDC MSMT LEADCHNL RV PACING THRESHOLD PULSEWIDTH: 0.4 ms
MDC IDC MSMT LEADCHNL RV SENSING INTR AMPL: 11.625 mV
MDC IDC SET LEADCHNL LV PACING AMPLITUDE: 2.25 V
MDC IDC SET LEADCHNL LV PACING PULSEWIDTH: 1 ms
MDC IDC STAT BRADY AP VS PERCENT: 0 %
MDC IDC STAT BRADY RA PERCENT PACED: 0 %
MDC IDC STAT BRADY RV PERCENT PACED: 98.51 %

## 2017-07-07 NOTE — Progress Notes (Signed)
Remote pacemaker transmission.   

## 2017-07-08 ENCOUNTER — Encounter: Payer: Self-pay | Admitting: Cardiology

## 2017-09-02 ENCOUNTER — Other Ambulatory Visit: Payer: Self-pay | Admitting: Cardiovascular Disease

## 2017-09-02 NOTE — Telephone Encounter (Signed)
Rx sent to pharmacy   

## 2017-10-05 ENCOUNTER — Ambulatory Visit (INDEPENDENT_AMBULATORY_CARE_PROVIDER_SITE_OTHER): Payer: Medicare Other | Admitting: *Deleted

## 2017-10-05 DIAGNOSIS — I639 Cerebral infarction, unspecified: Secondary | ICD-10-CM | POA: Diagnosis not present

## 2017-10-05 NOTE — Progress Notes (Signed)
Remote pacemaker transmission.   

## 2017-10-15 LAB — CUP PACEART REMOTE DEVICE CHECK
Battery Remaining Longevity: 30 mo
Battery Voltage: 2.93 V
Brady Statistic AP VP Percent: 0 %
Brady Statistic AS VP Percent: 94.96 %
Brady Statistic AS VS Percent: 5.04 %
Brady Statistic RA Percent Paced: 0 %
Brady Statistic RV Percent Paced: 98.28 %
Date Time Interrogation Session: 20190603200438
Implantable Lead Implant Date: 19971124
Implantable Lead Location: 753858
Implantable Lead Location: 753860
Implantable Lead Model: 4024
Implantable Lead Model: 4194
Implantable Pulse Generator Implant Date: 20120619
Lead Channel Impedance Value: 4047 Ohm
Lead Channel Impedance Value: 4047 Ohm
Lead Channel Impedance Value: 475 Ohm
Lead Channel Impedance Value: 532 Ohm
Lead Channel Impedance Value: 760 Ohm
Lead Channel Pacing Threshold Amplitude: 0.5 V
Lead Channel Pacing Threshold Pulse Width: 0.4 ms
Lead Channel Setting Pacing Amplitude: 2 V
Lead Channel Setting Pacing Pulse Width: 1 ms
MDC IDC LEAD IMPLANT DT: 20070831
MDC IDC MSMT LEADCHNL LV IMPEDANCE VALUE: 703 Ohm
MDC IDC MSMT LEADCHNL LV IMPEDANCE VALUE: 969 Ohm
MDC IDC MSMT LEADCHNL LV PACING THRESHOLD AMPLITUDE: 1 V
MDC IDC MSMT LEADCHNL LV PACING THRESHOLD PULSEWIDTH: 1 ms
MDC IDC MSMT LEADCHNL RV IMPEDANCE VALUE: 532 Ohm
MDC IDC MSMT LEADCHNL RV IMPEDANCE VALUE: 570 Ohm
MDC IDC MSMT LEADCHNL RV SENSING INTR AMPL: 11.625 mV
MDC IDC MSMT LEADCHNL RV SENSING INTR AMPL: 11.625 mV
MDC IDC SET LEADCHNL LV PACING AMPLITUDE: 2 V
MDC IDC SET LEADCHNL RV PACING PULSEWIDTH: 0.4 ms
MDC IDC SET LEADCHNL RV SENSING SENSITIVITY: 2.8 mV
MDC IDC STAT BRADY AP VS PERCENT: 0 %

## 2017-11-04 ENCOUNTER — Other Ambulatory Visit: Payer: Self-pay | Admitting: Cardiovascular Disease

## 2017-11-30 ENCOUNTER — Other Ambulatory Visit: Payer: Self-pay | Admitting: Cardiovascular Disease

## 2018-01-05 ENCOUNTER — Ambulatory Visit (INDEPENDENT_AMBULATORY_CARE_PROVIDER_SITE_OTHER): Payer: Medicare Other | Admitting: *Deleted

## 2018-01-05 DIAGNOSIS — I442 Atrioventricular block, complete: Secondary | ICD-10-CM

## 2018-01-06 NOTE — Progress Notes (Signed)
Remote pacemaker transmission.   

## 2018-01-27 LAB — CUP PACEART REMOTE DEVICE CHECK
Brady Statistic AP VP Percent: 0 %
Brady Statistic AP VS Percent: 0 %
Brady Statistic AS VP Percent: 94.78 %
Brady Statistic AS VS Percent: 5.22 %
Implantable Lead Implant Date: 19971124
Implantable Lead Implant Date: 20070831
Implantable Lead Location: 753858
Implantable Lead Location: 753860
Implantable Lead Model: 4024
Lead Channel Impedance Value: 475 Ohm
Lead Channel Impedance Value: 513 Ohm
Lead Channel Impedance Value: 551 Ohm
Lead Channel Impedance Value: 665 Ohm
Lead Channel Impedance Value: 722 Ohm
Lead Channel Impedance Value: 912 Ohm
Lead Channel Pacing Threshold Amplitude: 0.875 V
Lead Channel Pacing Threshold Amplitude: 1.25 V
Lead Channel Sensing Intrinsic Amplitude: 11.625 mV
Lead Channel Sensing Intrinsic Amplitude: 11.625 mV
Lead Channel Setting Pacing Amplitude: 2 V
Lead Channel Setting Pacing Amplitude: 2.25 V
Lead Channel Setting Pacing Pulse Width: 0.4 ms
Lead Channel Setting Pacing Pulse Width: 1 ms
Lead Channel Setting Sensing Sensitivity: 2.8 mV
MDC IDC MSMT BATTERY REMAINING LONGEVITY: 28 mo
MDC IDC MSMT BATTERY VOLTAGE: 2.93 V
MDC IDC MSMT LEADCHNL LV IMPEDANCE VALUE: 532 Ohm
MDC IDC MSMT LEADCHNL LV PACING THRESHOLD PULSEWIDTH: 1 ms
MDC IDC MSMT LEADCHNL RA IMPEDANCE VALUE: 4047 Ohm
MDC IDC MSMT LEADCHNL RA IMPEDANCE VALUE: 4047 Ohm
MDC IDC MSMT LEADCHNL RV PACING THRESHOLD PULSEWIDTH: 0.4 ms
MDC IDC PG IMPLANT DT: 20120619
MDC IDC SESS DTM: 20190903201152
MDC IDC STAT BRADY RA PERCENT PACED: 0 %
MDC IDC STAT BRADY RV PERCENT PACED: 98.19 %

## 2018-02-16 ENCOUNTER — Encounter: Payer: Self-pay | Admitting: Family Medicine

## 2018-02-16 ENCOUNTER — Ambulatory Visit (INDEPENDENT_AMBULATORY_CARE_PROVIDER_SITE_OTHER): Payer: Medicare Other

## 2018-02-16 ENCOUNTER — Ambulatory Visit (INDEPENDENT_AMBULATORY_CARE_PROVIDER_SITE_OTHER): Payer: Medicare Other | Admitting: Family Medicine

## 2018-02-16 VITALS — BP 138/78 | HR 71 | Temp 96.9°F | Ht 66.0 in | Wt 147.0 lb

## 2018-02-16 DIAGNOSIS — K5909 Other constipation: Secondary | ICD-10-CM | POA: Diagnosis not present

## 2018-02-16 DIAGNOSIS — R109 Unspecified abdominal pain: Secondary | ICD-10-CM | POA: Diagnosis not present

## 2018-02-16 DIAGNOSIS — R1030 Lower abdominal pain, unspecified: Secondary | ICD-10-CM

## 2018-02-16 MED ORDER — POLYETHYLENE GLYCOL 3350 17 GM/SCOOP PO POWD: 17.0000 g | Freq: Every day | ORAL | 1 refills | Status: DC

## 2018-02-16 NOTE — Patient Instructions (Signed)
Miralax 2 capfuls daily until you start having good solid bowel movements, formed but not hard. Once you start having good bowel movements, cut back to 1 capful daily. If you are having only diarrhea, take every other or every third day.    Constipation, Adult Constipation is when a person:  Poops (has a bowel movement) fewer times in a week than normal.  Has a hard time pooping.  Has poop that is dry, hard, or bigger than normal.  Follow these instructions at home: Eating and drinking   Eat foods that have a lot of fiber, such as: ? Fresh fruits and vegetables. ? Whole grains. ? Beans.  Eat less of foods that are high in fat, low in fiber, or overly processed, such as: ? [place] fries. ? Hamburgers. ? Cookies. ? Candy. ? Soda.  Drink enough fluid to keep your pee (urine) clear or pale yellow. General instructions  Exercise regularly or as told by your doctor.  Go to the restroom when you feel like you need to poop. Do not hold it in.  Take over-the-counter and prescription medicines only as told by your doctor. These include any fiber supplements.  Do pelvic floor retraining exercises, such as: ? Doing deep breathing while relaxing your lower belly (abdomen). ? Relaxing your pelvic floor while pooping.  Watch your condition for any changes.  Keep all follow-up visits as told by your doctor. This is important. Contact a doctor if:  You have pain that gets worse.  You have a fever.  You have not pooped for 4 days.  You throw up (vomit).  You are not hungry.  You lose weight.  You are bleeding from the anus.  You have thin, pencil-like poop (stool). Get help right away if:  You have a fever, and your symptoms suddenly get worse.  You leak poop or have blood in your poop.  Your belly feels hard or bigger than normal (is bloated).  You have very bad belly pain.  You feel dizzy or you faint. This information is not intended to replace advice given to  you by your health care provider. Make sure you discuss any questions you have with your health care provider. Document Released: 10/08/2007 Document Revised: 11/09/2015 Document Reviewed: 10/10/2015 Elsevier Interactive Patient Education  2018 [employer].

## 2018-02-16 NOTE — Progress Notes (Signed)
Subjective:    Patient ID: Kimberly [name], female    DOB: [birth year], 84 y.o.   MRN: [id number]  Chief Complaint:  Abdominal Pain   HPI: Kimberly [name] is a 84 y.o. female presenting on 02/16/2018 for Abdominal Pain  Pt presents today for complaints of lower abdominal pain. She reports this pain started several days ago and is waxing and waning. She states at times the pain is in her right lower quadrant and at other times in her left lower quadrant. Pt states her abdomen is distended with the pain. She reports her last bowel movement was over 2 days ago and very small. States the stool was formed. She states the pain is cramping at times and sharp at times. She denies fever, chills, nausea, vomiting, diarrhea, blood in stool, weakness, or fatigue. States she has not taken anything to help the pain.   Relevant past medical, surgical, family, and social history reviewed and updated as indicated.  Allergies and medications reviewed and updated.   Past Medical History:  Diagnosis Date  . Acute blood loss anemia 10/11/2012  . Acute diverticulitis 08/24/2013  . Acute on chronic combined systolic and diastolic CHF, NYHA class 4 ([place]) 11/15/2013  . Antral ulcer 10/11/2012  . Arrhythmia    atrial fibb  . Atrial fibrillation ([place])   . Cardiomyopathy, nonischemic ([place])   . Chronic anticoagulation 10/12/2012  . CKD (chronic kidney disease) stage 3, GFR 30-59 ml/min ([facility]) 10/12/2012  . Depression   . Erosive esophagitis 10/11/2012  . Fibromyalgia   . Glaucoma   . H/O echocardiogram 2007   EF 40-45%,         . Hypertension   . Osteoarthritis   . Pacemaker    Last saw cards 07/2013  . Scoliosis     Past Surgical History:  Procedure Laterality Date  . ABDOMINAL HYSTERECTOMY    . APPENDECTOMY    . BACK SURGERY    . BIOPSY  03/03/2017   Procedure: BIOPSY;  Surgeon: Danie Binder, MD;  [facility];  Service: Endoscopy;;  gastric  . BREAST SURGERY    . CARDIAC CATHETERIZATION   12/08/2005   LAD AND LEFT MAIN WITH NO HIGH-GRADE STENOSIS. MILD DISEASE IN THE CX AND LAD SYSTEM. SEVERE LV DYSFUNCTION WITH DILATION OF THE LV. EF 15-20%. LV END-DIASTOLIC PRESSURE IS 90. +1 MR.  . CHOLECYSTECTOMY    . COLONOSCOPY N/A 03/03/2017   Procedure: COLONOSCOPY;  Surgeon: Danie Binder, MD;  [facility];  Service: Endoscopy;  Laterality: N/A;  . CYSTOSCOPY N/A 02/24/2013   Procedure: CYSTOSCOPY WITH URETHRAL DILITATION;  Surgeon: Marissa Nestle, MD;  Location: [facility];  Service: Urology;  Laterality: N/A;  . DOPPLER ECHOCARDIOGRAPHY N/A 05/30/2010   LV SIZE IS NORMAL. LV SYSTOLIC FUNCTION IS LOW NORMAL. EF=50-55%. MILD INFERIOR HYPOKINESIS.MILD TO MODERATE POSTERIOR WALL HYPOKINESIS.PACEMAKER LEAD IN THE RV. LA IS MILDLY DILATED. RA IS MODERATE TO SEVERLY DILATED. PACEMAKER LEAD IN THE RA. MILD CALCICICATION OF THE MV APPARATUS. MODERATE MR. MILD TO MODERATE TR. MILD PHTN.AV MILDLY SCLEROTIC.  [name] ESOPHAGOGASTRODUODENOSCOPY N/A 10/13/2012   Dr. Gala Romney: severe ulcerative reflux esophagitis, question of Barrett's but negative path, single deep prepyloric antral ulcer, negative H.pylori  . ESOPHAGOGASTRODUODENOSCOPY N/A 03/03/2017   Procedure: ESOPHAGOGASTRODUODENOSCOPY (EGD);  Surgeon: Danie Binder, MD;  [facility];  Service: Endoscopy;  Laterality: N/A;  . HERNIA REPAIR     right inguinal hernia and umbilical  . LOWETR EXT  VENOUS Bilateral 11-08-10   R & L- NO EVIDENCE OF THROMBUS OR THROMBOPHLEBITIS. THERE IS MILD AMOUNT OF SUBCUTANEOUS EDEMA NOTED WITHIN THE LEFT CALF AND ANKLE. R & L GSV AND SSV- NO VENOUS INSUFF NOTED.  [name] NECK SURGERY    . NUCLEAR STRESS TEST N/A 02/13/2009   NORMAL PATTERN OF PERFUSION IN ALL REGIONS. POST STRESS VENTICULAR SIZE IS NORMAL. POST  STESS EF 85%.  NORMAL MYOCARDIAL PERFUSION STUDY.  [name] PACEMAKER INSERTION    . POLYPECTOMY  03/03/2017   Procedure: POLYPECTOMY;  Surgeon: Danie Binder, MD;  [facility];  Service:  Endoscopy;;  colon  . TONSILLECTOMY    . YAG LASER APPLICATION Bilateral 10/09/3014   Procedure: YAG LASER APPLICATION;  Surgeon: Williams Che, MD;  Location: [facility];  Service: Ophthalmology;  Laterality: Bilateral;    Social History   Socioeconomic History  . Marital status: Widowed    Spouse name: Not on file  . Number of children: Not on file  . Years of education: Not on file  . Highest education level: Not on file  Occupational History  . 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 Financial resource strain: Not on file  . Food insecurity:    Worry: Not on file    Inability: Not on file  . Transportation needs:    Medical: Not on file    Non-medical: Not on file  Tobacco Use  . Smoking status: Former Smoker    Packs/day: 0.50    Years: 30.00    Pack years: 15.00    Types: Cigarettes    Last attempt to quit: 12/13/2004    Years since quitting: 13.1  . Smokeless tobacco: Never Used  . Tobacco comment: former smoker  Substance and Sexual Activity  . Alcohol use: Yes    Alcohol/week: 0.0 standard drinks    Comment: history of drinking a 1/2 glass of wine in the evening  . Drug use: No  . Sexual activity: Never  Lifestyle  . Physical activity:    Days per week: Not on file    Minutes per session: Not on file  . Stress: Not on file  Relationships  . Social connections:    Talks on phone: Not on file    Gets together: Not on file    Attends religious service: Not on file    Active member of club or organization: Not on file    Attends meetings of clubs or organizations: Not on file    Relationship status: Not on file  . Intimate partner violence:    Fear of current or ex partner: Not on file    Emotionally abused: Not on file    Physically abused: Not on file    Forced sexual activity: Not on file  Other Topics Concern  . Not on file  Social History Narrative  . Not on file    Outpatient Encounter  Medications as of 02/16/2018  Medication Sig  . carvedilol (COREG) 6.25 MG tablet Take 1 tablet (6.25 mg total) by mouth 2 (two) times daily with a meal. KEEP OV.  [name] ELIQUIS 2.5 MG TABS tablet TAKE 1 TABLET BY MOUTH  TWICE A DAY  . furosemide (LASIX) 20 MG tablet TAKE 1 TABLET BY MOUTH  DAILY  . Multiple Vitamins-Minerals (PRESERVISION AREDS 2) CAPS Take 1 capsule daily by mouth.  Vladimir Faster Glycol-Propyl Glycol (SYSTANE ULTRA) 0.4-0.3 % SOLN Place 1-2 drops  2 (two) times daily as needed into both eyes (dry eyes).   [name] loperamide (IMODIUM A-D) 2 MG tablet Take 2 mg 4 (four) times daily as needed by mouth for diarrhea or loose stools.  . pantoprazole (PROTONIX) 40 MG tablet Take 40 mg daily by mouth.  [name] PARoxetine (PAXIL) 20 MG tablet Take 1 tablet (20 mg total) by mouth daily. (Patient not taking: Reported on 02/16/2018)  . polyethylene glycol powder (GLYCOLAX/MIRALAX) powder Take 17 g by mouth daily.  . potassium chloride SA (K-DUR,KLOR-CON) 20 MEQ tablet Take 1 tablet (20 mEq total) by mouth 2 (two) times daily. (Patient not taking: Reported on 02/16/2018)  . Probiotic Product (PROBIOTIC COLON SUPPORT) CAPS Take 1 capsule 2 (two) times daily with a meal by mouth.  . sodium chloride (OCEAN) 0.65 % SOLN nasal spray Place 1 spray 2 (two) times daily as needed into both nostrils for congestion.   . traZODone (DESYREL) 50 MG tablet Take 0.5-2 tablets (25-100 mg total) at bedtime as needed by mouth for sleep. (Patient not taking: Reported on 02/16/2018)  . [DISCONTINUED] acetaminophen (TYLENOL) 500 MG tablet Take 1,000 mg every 6 (six) hours as needed by mouth for headache (pain).    No facility-administered encounter medications on file as of 02/16/2018.     Allergies  Allergen Reactions  . Ciprofloxacin Other (See Comments)    Possibly caused diarrhea November 2018  . Flagyl [Metronidazole] Other (See Comments)    Possibly caused diarrhea November 2018  . Penicillins Hives    Has patient had  a PCN reaction causing immediate rash, facial/tongue/throat swelling, SOB or lightheadedness with hypotension: Yes Has patient had a PCN reaction causing severe rash involving mucus membranes or skin necrosis: No Has patient had a PCN reaction that required hospitalization No Has patient had a PCN reaction occurring within the last 10 years: no If all of the above answers are "NO", then may proceed with Cephalosporin use.   . Sulfa Antibiotics Rash    Review of Systems  Constitutional: Negative for chills, fatigue and fever.  Respiratory: Negative for cough and shortness of breath.   Cardiovascular: Negative for chest pain and palpitations.  Gastrointestinal: Positive for abdominal distention, abdominal pain and constipation. Negative for anal bleeding, blood in stool, diarrhea, nausea, rectal pain and vomiting.  Endocrine: Negative for cold intolerance and heat intolerance.  Genitourinary: Negative for difficulty urinating, dysuria, flank pain, frequency and urgency.  Neurological: Negative for dizziness and weakness.  All other systems reviewed and are negative.       Objective:    BP 138/78   Pulse 71   Temp (!) 96.9 F (36.1 C) (Oral)   Ht 5\' 6"  (1.676 m)   Wt 147 lb (66.7 kg)   BMI 23.73 kg/m    Wt Readings from Last 3 Encounters:  02/16/18 147 lb (66.7 kg)  04/29/17 144 lb 9.6 oz (65.6 kg)  03/25/17 152 lb 3.2 oz (69 kg)    Physical Exam  Constitutional: She is oriented to person, place, and time. She appears well-developed and well-nourished. She does not appear ill.  Cardiovascular: Normal rate, regular rhythm and normal heart sounds.  Pulmonary/Chest: Effort normal and breath sounds normal. No respiratory distress.  Abdominal: Soft. Bowel sounds are normal. She exhibits distension. She exhibits no ascites. There is no hepatosplenomegaly. There is tenderness in the right lower quadrant, periumbilical area, suprapubic area and left lower quadrant. There is no  rigidity, no rebound, no guarding, no CVA tenderness, no tenderness at McBurney's  point and negative Murphy's sign.  Neurological: She is alert and oriented to person, place, and time.  Skin: Skin is warm and dry. Capillary refill takes less than 2 seconds. No pallor.  Psychiatric: She has a normal mood and affect. Her behavior is normal.  Nursing note and vitals reviewed.      X:Ray: KUB: Constipation, not concerning for bowel obstruction. Preliminary x-ray reading by Monia Pouch, FNP-C, [facility].   Pertinent labs & imaging results that were available during my care of the patient were reviewed by me and considered in my medical decision making.  Assessment & Plan:  Lulani was seen today for abdominal pain.  Diagnoses and all orders for this visit:  Lower abdominal pain KUB revealed constipation. Pt with lower abdominal tenderness, will check WBC. -     DG Abd 1 View; Future -     CBC with Differential/Platelet  Other constipation KUB revealed constipation, pt will be notified if radiologist findings suggest otherwise. Increase water and fiber intake. Medications as prescribed. Return for worsening pain, nausea, vomiting, fever, blood in stool, or other concerning symptoms.  -     DG Abd 1 View; Future -     polyethylene glycol powder (GLYCOLAX/MIRALAX) powder; Take 17 g by mouth daily.      Continue all other maintenance medications.  Follow up plan: Return in about 2 weeks (around 03/02/2018).  Educational handout given for constipation   The above assessment and management plan was discussed with the patient. The patient verbalized understanding of and has agreed to the management plan. Patient is aware to call the clinic if symptoms persist or worsen. Patient is aware when to return to the clinic for a follow-up visit. Patient educated on when it is appropriate to go to the emergency department.   Monia Pouch, FNP-C [facility] [phone]

## 2018-02-17 LAB — CBC WITH DIFFERENTIAL/PLATELET
BASOS ABS: 0 10*3/uL (ref 0.0–0.2)
Basos: 0 %
EOS (ABSOLUTE): 0.1 10*3/uL (ref 0.0–0.4)
EOS: 1 %
HEMATOCRIT: 40.7 % (ref 34.0–46.6)
HEMOGLOBIN: 13.8 g/dL (ref 11.1–15.9)
IMMATURE GRANULOCYTES: 0 %
Immature Grans (Abs): 0 10*3/uL (ref 0.0–0.1)
Lymphocytes Absolute: 1.1 10*3/uL (ref 0.7–3.1)
Lymphs: 18 %
MCH: 34.1 pg — ABNORMAL HIGH (ref 26.6–33.0)
MCHC: 33.9 g/dL (ref 31.5–35.7)
MCV: 101 fL — ABNORMAL HIGH (ref 79–97)
MONOCYTES: 8 %
MONOS ABS: 0.5 10*3/uL (ref 0.1–0.9)
NEUTROS PCT: 73 %
Neutrophils Absolute: 4.3 10*3/uL (ref 1.4–7.0)
Platelets: 154 10*3/uL (ref 150–450)
RBC: 4.05 x10E6/uL (ref 3.77–5.28)
RDW: 12.7 % (ref 12.3–15.4)
WBC: 6 10*3/uL (ref 3.4–10.8)

## 2018-02-27 ENCOUNTER — Telehealth: Payer: Self-pay | Admitting: Internal Medicine

## 2018-02-27 NOTE — Telephone Encounter (Signed)
Cardiology Moonlighter Note  Return page from patient.  Patient describing symptoms of shortness of breath.  Symptoms started several days ago and have persisted to this time.  Symptoms are constant, present at rest and with exertion.  Her symptoms do worsen with exertion.  She has chronic bilateral ankle and foot edema which is not changed in severity lately.  There is no positional component to her symptoms.  She has never felt anything like this before.  The reason she called was that she was concerned that her pacemaker was no longer functioning.  She denies any symptoms of syncope, presyncope, lightheadedness, dizziness.  She has been taking all of her medications as prescribed.  I counseled the patient that given the persistent nature of her symptoms she should be seen sometime in the near future for evaluation.  She does not feel as though her symptoms are severe enough to be seen in the emergency department overnight tonight.  She states that if her symptoms persist through tomorrow morning she will either come to the emergency department or contact her outpatient provider for short interval follow-up.  She understands that if her symptoms worsen tonight or she develops any new symptoms she should come to the emergency department for evaluation.  Marcie Mowers, MD Cardiology Fellow, PGY-6

## 2018-03-03 NOTE — Telephone Encounter (Signed)
Pacemaker doing it's job. Any chance Kimberly [name] is getting more sodium than usual to explain the swelling? Is she monitoring weight?  MCr

## 2018-03-11 ENCOUNTER — Encounter: Payer: Medicare Other | Admitting: Family Medicine

## 2018-03-13 ENCOUNTER — Telehealth: Payer: Self-pay | Admitting: Family Medicine

## 2018-03-13 NOTE — Telephone Encounter (Signed)
Patient states the trazadone has never helped her sleep.  Now she is having leg aching at night keeping her awake also.  Scheduled patient to come in to see Dr Dettinger Monday 03/15/18.

## 2018-03-15 ENCOUNTER — Ambulatory Visit (INDEPENDENT_AMBULATORY_CARE_PROVIDER_SITE_OTHER): Payer: Medicare Other | Admitting: Family Medicine

## 2018-03-15 ENCOUNTER — Encounter: Payer: Self-pay | Admitting: Family Medicine

## 2018-03-15 VITALS — BP 167/82 | HR 76 | Temp 97.0°F | Ht 66.0 in | Wt 150.0 lb

## 2018-03-15 DIAGNOSIS — B351 Tinea unguium: Secondary | ICD-10-CM

## 2018-03-15 DIAGNOSIS — I10 Essential (primary) hypertension: Secondary | ICD-10-CM | POA: Diagnosis not present

## 2018-03-15 DIAGNOSIS — F331 Major depressive disorder, recurrent, moderate: Secondary | ICD-10-CM | POA: Diagnosis not present

## 2018-03-15 DIAGNOSIS — IMO0002 Reserved for concepts with insufficient information to code with codable children: Secondary | ICD-10-CM

## 2018-03-15 DIAGNOSIS — D649 Anemia, unspecified: Secondary | ICD-10-CM

## 2018-03-15 DIAGNOSIS — K219 Gastro-esophageal reflux disease without esophagitis: Secondary | ICD-10-CM | POA: Diagnosis not present

## 2018-03-15 DIAGNOSIS — R7989 Other specified abnormal findings of blood chemistry: Secondary | ICD-10-CM

## 2018-03-15 MED ORDER — PAROXETINE HCL 20 MG PO TABS: 20.0000 mg | ORAL_TABLET | Freq: Every day | ORAL | 5 refills | Status: DC

## 2018-03-15 MED ORDER — FAMOTIDINE 40 MG PO TABS: 40.0000 mg | ORAL_TABLET | Freq: Every day | ORAL | 5 refills | Status: DC

## 2018-03-15 NOTE — Progress Notes (Signed)
BP (!) 167/82   Pulse 76   Temp (!) 97 F (36.1 C) (Oral)   Ht '5\' 6"'$  (1.676 m)   Wt 150 lb (68 kg)   BMI 24.21 kg/m    Subjective:    Patient ID: Kimberly [name], female    DOB: 06/01/1926, 84 y.o.   MRN: [id number]  HPI: Kimberly [name] is a 84 y.o. female presenting on 03/15/2018 for legs aching (Bilateral -  x 2 months and getting worse. Patient states it keeps her up at night.); Abdominal Pain (patient states it has been going on for awhile and not getting any better.); Constipation (on and off- ongoing ); and Depression (refill on paxil- states she has been out of it for awhile)   HPI Depression and anxiety Patient is coming in for depression today and says her depression has been a lot worse.  She says she is been out of her Paxil that she is to take and she does not know why she stopped it except for she just let it lapse and stopped taking it because she did not know if she needed anymore and she would like to restart it.  She says it worked very well for her back in the day when she did have it and would like to get going back on it.  She denies any suicidal ideations or thoughts of hurting herself. Depression screen [facility] 2/9 03/15/2018 02/16/2018 03/25/2017 03/12/2017 02/26/2017  Decreased Interest 3 3 0 2 3  Down, Depressed, Hopeless '3 2 3 2 3  '$ PHQ - 2 Score '6 5 3 4 6  '$ Altered sleeping '3 3 3 3 3  '$ Tired, decreased energy '3 3 3 3 3  '$ Change in appetite 0 '2 3 3 2  '$ Feeling bad or failure about yourself  1 0 0 1 2  Trouble concentrating 1 0 3 0 1  Moving slowly or fidgety/restless 1 0 0 2 3  Suicidal thoughts 0 0 0 0 0  PHQ-9 Score '15 13 15 16 20  '$ Difficult doing work/chores - - - Somewhat difficult -  Some recent data might be hidden    Patient has a very thickened and yellow toenail on her right great toe and a small spot that she feels is bothering her on the bottom of her right fourth toe and she would like to get these checked out by podiatry of possible.  GERD Patient is  currently on no medication because the previous antacids had caused her constipation and she would like to try something that causes less constipation but still helps with her indigestion.  She denies any major symptoms or abdominal pain or belching or burping. She denies any blood in her stool or lightheadedness or dizziness.   Patient has hypertension and anemia and will check labs today.   Relevant past medical, surgical, family and social history reviewed and updated as indicated. Interim medical history since our last visit reviewed. Allergies and medications reviewed and updated.  Review of Systems  Constitutional: Negative for chills and fever.  Eyes: Negative for redness and visual disturbance.  Respiratory: Negative for chest tightness and shortness of breath.   Cardiovascular: Negative for chest pain and leg swelling.  Gastrointestinal: Positive for abdominal pain and constipation. Negative for abdominal distention, diarrhea, nausea and vomiting.  Musculoskeletal: Negative for back pain and gait problem.  Skin: Negative for rash.  Neurological: Negative for light-headedness and headaches.  Psychiatric/Behavioral: Positive for decreased concentration, dysphoric mood and sleep disturbance. Negative  for agitation, behavioral problems, self-injury and suicidal ideas. The patient is nervous/anxious.   All other systems reviewed and are negative.   Per HPI unless specifically indicated above   Allergies as of 03/15/2018      Reactions   Ciprofloxacin Other (See Comments)   Possibly caused diarrhea November 2018   Flagyl [metronidazole] Other (See Comments)   Possibly caused diarrhea November 2018   Penicillins Hives   Has patient had a PCN reaction causing immediate rash, facial/tongue/throat swelling, SOB or lightheadedness with hypotension: Yes Has patient had a PCN reaction causing severe rash involving mucus membranes or skin necrosis: No Has patient had a PCN reaction that  required hospitalization No Has patient had a PCN reaction occurring within the last 10 years: no If all of the above answers are "NO", then may proceed with Cephalosporin use.   Sulfa Antibiotics Rash      Medication List        Accurate as of 03/15/18  4:12 PM. Always use your most recent med list.          carvedilol 6.25 MG tablet Commonly known as:  COREG Take 1 tablet (6.25 mg total) by mouth 2 (two) times daily with a meal. KEEP OV.   ELIQUIS 2.5 MG Tabs tablet Generic drug:  apixaban TAKE 1 TABLET BY MOUTH  TWICE A DAY   furosemide 20 MG tablet Commonly known as:  LASIX TAKE 1 TABLET BY MOUTH  DAILY   PARoxetine 20 MG tablet Commonly known as:  PAXIL Take 1 tablet (20 mg total) by mouth daily.   potassium chloride SA 20 MEQ tablet Commonly known as:  K-DUR,KLOR-CON Take 1 tablet (20 mEq total) by mouth 2 (two) times daily.   PRESERVISION AREDS 2 Caps Take 1 capsule daily by mouth.   sodium chloride 0.65 % Soln nasal spray Commonly known as:  OCEAN Place 1 spray 2 (two) times daily as needed into both nostrils for congestion.   SYSTANE ULTRA 0.4-0.3 % Soln Generic drug:  Polyethyl Glycol-Propyl Glycol Place 1-2 drops 2 (two) times daily as needed into both eyes (dry eyes).   traZODone 50 MG tablet Commonly known as:  DESYREL Take 0.5-2 tablets (25-100 mg total) at bedtime as needed by mouth for sleep.          Objective:    BP (!) 167/82   Pulse 76   Temp (!) 97 F (36.1 C) (Oral)   Ht '5\' 6"'$  (1.676 m)   Wt 150 lb (68 kg)   BMI 24.21 kg/m   Wt Readings from Last 3 Encounters:  03/15/18 150 lb (68 kg)  02/16/18 147 lb (66.7 kg)  04/29/17 144 lb 9.6 oz (65.6 kg)    Physical Exam  Constitutional: She is oriented to person, place, and time. She appears well-developed and well-nourished. No distress.  Eyes: Conjunctivae are normal.  Neck: Neck supple. No thyromegaly present.  Cardiovascular: Normal rate, regular rhythm, normal heart sounds and  intact distal pulses.  No murmur heard. Pulmonary/Chest: Effort normal and breath sounds normal. No respiratory distress. She has no wheezes.  Musculoskeletal: Normal range of motion. She exhibits no edema.  Lymphadenopathy:    She has no cervical adenopathy.  Neurological: She is alert and oriented to person, place, and time. Coordination normal.  Skin: Skin is warm and dry. No rash noted. She is not diaphoretic.  Very thick and yellow toenail on right great toe, cutaneous horn on the bottom of right fourth toe, will send to  podiatry for both  Psychiatric: She has a normal mood and affect. Her behavior is normal.  Nursing note and vitals reviewed.       Assessment & Plan:   Problem List Items Addressed This Visit      Cardiovascular and Mediastinum   Essential hypertension   Relevant Orders   CBC with Differential/Platelet   CMP14+EGFR   Lipid panel     Other   Depression   Relevant Medications   PARoxetine (PAXIL) 20 MG tablet   Anemia   Relevant Orders   CBC with Differential/Platelet    Other Visit Diagnoses    Onychomycosis    -  Primary   Relevant Orders   Ambulatory referral to Podiatry   Gastroesophageal reflux disease without esophagitis       Relevant Medications   famotidine (PEPCID) 40 MG tablet   Other Relevant Orders   CBC with Differential/Platelet       Follow up plan: Return in about 3 months (around 06/15/2018), or if symptoms worsen or fail to improve.  Counseling provided for all of the vaccine components No orders of the defined types were placed in this encounter.   Caryl Pina, MD [facility] 03/15/2018, 4:12 PM

## 2018-03-16 ENCOUNTER — Telehealth: Payer: Self-pay | Admitting: Family Medicine

## 2018-03-16 ENCOUNTER — Other Ambulatory Visit: Payer: Self-pay | Admitting: Family Medicine

## 2018-03-16 DIAGNOSIS — F331 Major depressive disorder, recurrent, moderate: Secondary | ICD-10-CM

## 2018-03-16 LAB — CBC WITH DIFFERENTIAL/PLATELET
Basophils Absolute: 0 10*3/uL (ref 0.0–0.2)
Basos: 1 %
EOS (ABSOLUTE): 0.1 10*3/uL (ref 0.0–0.4)
EOS: 2 %
HEMATOCRIT: 39.9 % (ref 34.0–46.6)
HEMOGLOBIN: 13.5 g/dL (ref 11.1–15.9)
IMMATURE GRANS (ABS): 0.1 10*3/uL (ref 0.0–0.1)
IMMATURE GRANULOCYTES: 1 %
Lymphocytes Absolute: 1.3 10*3/uL (ref 0.7–3.1)
Lymphs: 21 %
MCH: 34.1 pg — ABNORMAL HIGH (ref 26.6–33.0)
MCHC: 33.8 g/dL (ref 31.5–35.7)
MCV: 101 fL — ABNORMAL HIGH (ref 79–97)
MONOCYTES: 9 %
Monocytes Absolute: 0.6 10*3/uL (ref 0.1–0.9)
Neutrophils Absolute: 4.1 10*3/uL (ref 1.4–7.0)
Neutrophils: 66 %
Platelets: 176 10*3/uL (ref 150–450)
RBC: 3.96 x10E6/uL (ref 3.77–5.28)
RDW: 13 % (ref 12.3–15.4)
WBC: 6.1 10*3/uL (ref 3.4–10.8)

## 2018-03-16 LAB — CMP14+EGFR
ALK PHOS: 70 IU/L (ref 39–117)
ALT: 8 IU/L (ref 0–32)
AST: 16 IU/L (ref 0–40)
Albumin/Globulin Ratio: 1.7 (ref 1.2–2.2)
Albumin: 4.3 g/dL (ref 3.5–4.7)
BILIRUBIN TOTAL: 0.4 mg/dL (ref 0.0–1.2)
BUN/Creatinine Ratio: 13 (ref 12–28)
BUN: 23 mg/dL (ref 8–27)
CHLORIDE: 106 mmol/L (ref 96–106)
CO2: 18 mmol/L — AB (ref 20–29)
CREATININE: 1.72 mg/dL — AB (ref 0.57–1.00)
Calcium: 8.9 mg/dL (ref 8.7–10.3)
GFR calc Af Amer: 30 mL/min/{1.73_m2} — ABNORMAL LOW (ref >59)
GFR calc non Af Amer: 26 mL/min/{1.73_m2} — ABNORMAL LOW (ref >59)
GLOBULIN, TOTAL: 2.5 g/dL (ref 1.5–4.5)
GLUCOSE: 88 mg/dL (ref 65–99)
Potassium: 4.1 mmol/L (ref 3.5–5.2)
Sodium: 144 mmol/L (ref 134–144)
Total Protein: 6.8 g/dL (ref 6.0–8.5)

## 2018-03-16 LAB — LIPID PANEL
CHOLESTEROL TOTAL: 179 mg/dL (ref 100–199)
Chol/HDL Ratio: 2.1 ratio (ref 0.0–4.4)
HDL: 84 mg/dL (ref >39)
LDL CALC: 82 mg/dL (ref 0–99)
TRIGLYCERIDES: 65 mg/dL (ref 0–149)
VLDL Cholesterol Cal: 13 mg/dL (ref 5–40)

## 2018-03-16 MED ORDER — FAMOTIDINE 40 MG PO TABS: 40.0000 mg | ORAL_TABLET | Freq: Every day | ORAL | 5 refills | Status: DC

## 2018-03-16 MED ORDER — PAROXETINE HCL 20 MG PO TABS: 20.0000 mg | ORAL_TABLET | Freq: Every day | ORAL | 5 refills | Status: DC

## 2018-03-16 NOTE — Telephone Encounter (Signed)
Per pharmacy patient was confused and states she sent a message to another doctor needing result for potassium to get her potassium filled. Pharmacy is waiting on the other doctors office to reply.

## 2018-03-16 NOTE — Addendum Note (Signed)
Addended by: Nigel Berthold C on: 03/16/2018 11:11 AM   Modules accepted: Orders

## 2018-03-16 NOTE — Telephone Encounter (Signed)
Per patient request she would like paxil and pepcid sent to [facility] instead of [facility]. RX re sent - patient aware.

## 2018-03-17 ENCOUNTER — Telehealth: Payer: Self-pay | Admitting: Family Medicine

## 2018-03-17 NOTE — Telephone Encounter (Signed)
Aware of provider's advice. 

## 2018-03-17 NOTE — Telephone Encounter (Signed)
Please advise 

## 2018-03-17 NOTE — Telephone Encounter (Signed)
She needs to give the medication at least 2 to 3 weeks to work, she needs to give it more time

## 2018-03-18 ENCOUNTER — Other Ambulatory Visit: Payer: Self-pay | Admitting: Cardiovascular Disease

## 2018-03-18 NOTE — Telephone Encounter (Signed)
Patient aware.

## 2018-03-18 NOTE — Telephone Encounter (Signed)
Patient states that she has dry mouth, sob, and just feels bad. She feels that it is coming from the famotidine. Patient advised to stop

## 2018-03-18 NOTE — Telephone Encounter (Signed)
Okay that sounds good, she can do Prilosec or omeprazole and said she wants to

## 2018-03-18 NOTE — Telephone Encounter (Signed)
Pt is taking otc famotidine and is having side effects please call back

## 2018-03-19 ENCOUNTER — Telehealth: Payer: Self-pay | Admitting: Family Medicine

## 2018-03-19 ENCOUNTER — Telehealth: Payer: Self-pay | Admitting: Cardiovascular Disease

## 2018-03-19 NOTE — Telephone Encounter (Signed)
Okay to use omeprazole.

## 2018-03-19 NOTE — Telephone Encounter (Signed)
Will defer this to her PCP.

## 2018-03-19 NOTE — Telephone Encounter (Signed)
Spoke to patient . Patient wanted to know if  She can rake generic prilosec as primary had suggested - she states the  Information on the - dizzy. RN spoke to pharmacist- patient can take omperazole  Take at least 30 min prior to any medication.    Patient also states her legs are aching all the time and keeps her from not resting at night. patient states she has mention this to primary "nothing has been done"   Patient tried  Bathing,elevating without success- patient aware will defer to Dr Sallyanne Kuster  Patient aware she can use omperazole

## 2018-03-19 NOTE — Telephone Encounter (Signed)
New Message:   Patient calling she is having very haert burn. Patient would like for some one to call back.

## 2018-03-19 NOTE — Telephone Encounter (Signed)
Pt states she is not sleeping and that what dr dettinger has gave her is not working and wants something that actually works   Pharmacy: [facility]

## 2018-03-19 NOTE — Telephone Encounter (Signed)
Spoke with Dr  Sallyanne Kuster -  - HAVE PATIENT TO DISCUSS ISSUE WITH PRIMARY --DR DETTINGER  PATIENT AWARE.

## 2018-03-20 ENCOUNTER — Telehealth: Payer: Self-pay | Admitting: Family Medicine

## 2018-03-22 MED ORDER — TRAZODONE HCL 50 MG PO TABS: 150.0000 mg | ORAL_TABLET | Freq: Every evening | ORAL | 3 refills | Status: DC | PRN

## 2018-03-22 NOTE — Telephone Encounter (Signed)
Patient states she seen on her AVS that she was anemic and was never told that she was. Patient is concerned- please advise

## 2018-03-22 NOTE — Telephone Encounter (Signed)
Please tell her that I increased her trazodone so she cannot take 150 mg of it, give it at least a week or 2 before calling back to see if it works.  It takes time Caryl Pina, MD [facility] 03/22/2018, 8:46 AM

## 2018-03-22 NOTE — Telephone Encounter (Signed)
Patient does not use [facility]- re sent to [facility]

## 2018-03-22 NOTE — Addendum Note (Signed)
Addended by: Nigel Berthold C on: 03/22/2018 12:06 PM   Modules accepted: Orders

## 2018-03-22 NOTE — Telephone Encounter (Signed)
She was not anemic on this last blood draw, her hemoglobin was 13.5, unlikely to be causing her dizziness

## 2018-03-22 NOTE — Telephone Encounter (Signed)
Patient aware and verbalizes understanding. 

## 2018-03-22 NOTE — Telephone Encounter (Signed)
Refer to previous note.

## 2018-03-23 NOTE — Telephone Encounter (Signed)
Patient aware and verbalizes understanding. 

## 2018-03-24 ENCOUNTER — Telehealth: Payer: Self-pay | Admitting: Family Medicine

## 2018-03-24 NOTE — Telephone Encounter (Signed)
Patient was notified on Nov 13 to allow more time for pills to work. Do you wish to change?

## 2018-03-24 NOTE — Telephone Encounter (Signed)
No please give it more time

## 2018-03-24 NOTE — Telephone Encounter (Signed)
Give it more time please come in at least 2 weeks

## 2018-03-25 ENCOUNTER — Telehealth: Payer: Self-pay | Admitting: Cardiovascular Disease

## 2018-03-25 NOTE — Telephone Encounter (Signed)
Pt aware.

## 2018-03-25 NOTE — Telephone Encounter (Signed)
Patient can take Omega-3 2gm twice daily as maximum daily dose. Please take after meal to minimize stomach discomfort

## 2018-03-25 NOTE — Telephone Encounter (Signed)
SPOKE TO PATIENT -- INFORMATION  GIVEN  TO PATIENT - VERBALIZED UNDERSTANDING.

## 2018-03-25 NOTE — Telephone Encounter (Signed)
I agree

## 2018-03-25 NOTE — Telephone Encounter (Signed)
Pt c/o medication issue:  1. Name of Medication: Omega XL Fatty Acid  2. How are you currently taking this medication (dosage and times per day)? 2 capsules 2x a day  3. Are you having a reaction (difficulty breathing--STAT)? no  4. What is your medication issue? She wants to make sure it's ok to take. Patient has not started to take them yet.

## 2018-03-25 NOTE — Telephone Encounter (Signed)
FORWARD TO DR AYGEFUWT AND [facility] WILL CALL PATIENT WITH ANSWER

## 2018-03-28 ENCOUNTER — Other Ambulatory Visit: Payer: Self-pay | Admitting: Cardiovascular Disease

## 2018-03-28 DIAGNOSIS — Z950 Presence of cardiac pacemaker: Secondary | ICD-10-CM

## 2018-03-28 DIAGNOSIS — I5043 Acute on chronic combined systolic (congestive) and diastolic (congestive) heart failure: Secondary | ICD-10-CM

## 2018-03-29 ENCOUNTER — Telehealth: Payer: Self-pay | Admitting: Family Medicine

## 2018-03-29 ENCOUNTER — Telehealth: Payer: Self-pay | Admitting: Cardiology

## 2018-03-29 ENCOUNTER — Encounter: Payer: Self-pay | Admitting: Cardiology

## 2018-03-29 MED ORDER — MIRTAZAPINE 30 MG PO TABS: 30.0000 mg | ORAL_TABLET | Freq: Every day | ORAL | 1 refills | Status: DC

## 2018-03-29 NOTE — Telephone Encounter (Signed)
Pt states the Trazodone isn't working at all, she is taking 150mg  and it doesn't do anything. Pt states she has used Temazepam in the past and it worked great. Please advise.

## 2018-03-29 NOTE — Telephone Encounter (Signed)
Please let her know that I am trying to avoid anything that could possibly be habit-forming and could possibly cause falls in elderly, I have sent in a replacement medication called Remeron that she can try instead of the trazodone which fits these criteria and have her take it daily at bedtime for at least 1 weeks before we know for sure if it is going to work or not. Caryl Pina, MD [facility] 03/29/2018, 1:01 PM

## 2018-03-29 NOTE — Telephone Encounter (Signed)
PT has called back wanting to speak to dr dettinger nurse said that the sleeping medicine is not working

## 2018-03-29 NOTE — Telephone Encounter (Signed)
Remote transmission reviewed. Presenting rhythm: Bp (h/o permAF + Eliquis). No episodes recorded. 100%Bp w/2%VSRp. Thoracic impedance below baseline since ~03/16/18.  Patient no longer follows in ICM - plan to re-enroll.   Called patient d/t recent sx's. Patient states that she has been experiencing ShOB, [name], and abdominal distention x 2-3 weeks. Patient states that she's been taking her Lasix daily as Rx'd. Patient states that she does not weigh daily. I counseled patient about the importance of weighing daily. Patient verbalized understanding.  Will forward information to Dr.Croitoru and [place] for review.  Patient currently has an appt scheduled for 12/12.

## 2018-03-29 NOTE — Telephone Encounter (Signed)
Patient aware and will try this.  Dr. Warrick Parisian had sent this to [facility] and patient needed it sent to [facility].  I contacted [facility] and cancelled the prescription there and resent it to [facility].

## 2018-03-29 NOTE — Telephone Encounter (Signed)
Patient called and stated that she is short of breath. She sent a transmission to see if the Shortness of breath is related to her device call routed to [facility] [employer].

## 2018-03-29 NOTE — Telephone Encounter (Signed)
Spoke with Kimberly [name]. She can lay flat, but does get dyspneic with light activity. Advised her to double furosemide between now and her upcoming appt, weigh daily and bring a weight log to appt, be extra careful with sodium intake at Thanksgiving, call911 or go to ED if she has frank orthopnea. MCr

## 2018-03-29 NOTE — Telephone Encounter (Signed)
Patient informed that Dr. Warrick Parisian did not check an A1C because she is not diabetic.  I informed her that her blood sugar level is normal.

## 2018-03-30 ENCOUNTER — Telehealth: Payer: Self-pay | Admitting: Family Medicine

## 2018-03-30 ENCOUNTER — Other Ambulatory Visit: Payer: Self-pay | Admitting: Family Medicine

## 2018-03-30 DIAGNOSIS — K279 Peptic ulcer, site unspecified, unspecified as acute or chronic, without hemorrhage or perforation: Secondary | ICD-10-CM

## 2018-03-30 DIAGNOSIS — K921 Melena: Secondary | ICD-10-CM

## 2018-03-30 NOTE — Telephone Encounter (Signed)
Prior note disregard - Patient states she has been having on and off dizziness. States she has an apt with Dettinger next week and will talk to him about it at appointment.

## 2018-03-30 NOTE — Telephone Encounter (Signed)
Sent to wrong pharmacy re sent to right pharmacy.

## 2018-03-31 ENCOUNTER — Telehealth: Payer: Self-pay | Admitting: Family Medicine

## 2018-03-31 NOTE — Telephone Encounter (Signed)
Patient states that the remeron is helping her sleep but she ended up wetting the bed since she normally gets up 3-4 times during the night. Advised patient to cup the tablet in half but patient states she will just wear pull ups. FYI

## 2018-03-31 NOTE — Telephone Encounter (Signed)
Please contact patient as requested.

## 2018-04-02 ENCOUNTER — Telehealth: Payer: Self-pay | Admitting: *Deleted

## 2018-04-02 ENCOUNTER — Telehealth: Payer: Self-pay | Admitting: Family Medicine

## 2018-04-02 NOTE — Telephone Encounter (Signed)
Patient notified to take her evening dose of eliquis as scheduled, and NOT to take a double dose even if she missed her morning dose.  Patient agreeable.

## 2018-04-02 NOTE — Telephone Encounter (Signed)
PT states that this morning she had dropped her pills in the floor, and picked them up but didn't pay attention to which ones she took, she is wanting to know if she can miss a dose of the ELIQUIS 2.5 MG TABS tablet since she isn't sure if she took it or not.

## 2018-04-02 NOTE — Telephone Encounter (Signed)
She can do the hydrocortisone but also do something like Lamisil for antifungal as well, do them both twice a day and then let us know if she continues to have it more than a week from now

## 2018-04-02 NOTE — Telephone Encounter (Signed)
Patient states that she has some "itchy red bumps" on the right groin area. It doesn't sound like there is any surrounding redness and there is no drainage, ulcerations, or open sores. She says that she spoke with someone about them last week but that she can't remember what she was told to do to treat them. I'm having trouble finding a note or visit with this information. She denies any recent travel and no one has stayed at her home either. No intimate contact with anyone else. She normally doesn't wear underwear when she is at home. She typically wears her robe without anything else on.   Recommended hydrocortisone cream applied thinly to the affected area once or twice a day for a few days. She wanted to see what Dr Dettinger recommended as well. Will forward to him for review and advice.

## 2018-04-02 NOTE — Telephone Encounter (Signed)
Returned call to patient, sounded like a fax machine picked up.  Attempted to call twice.

## 2018-04-02 NOTE — Telephone Encounter (Signed)
Patient notified of recommendations from Dr. Warrick Parisian.

## 2018-04-03 ENCOUNTER — Telehealth: Payer: Self-pay | Admitting: Family Medicine

## 2018-04-03 NOTE — Telephone Encounter (Signed)
Please address

## 2018-04-03 NOTE — Telephone Encounter (Signed)
Patient was informed that we cannot call in pain meds without being seen. Sh ewas told to take extra strength tylenol OTC.

## 2018-04-04 ENCOUNTER — Other Ambulatory Visit: Payer: Self-pay

## 2018-04-04 ENCOUNTER — Telehealth: Payer: Self-pay | Admitting: Nurse Practitioner

## 2018-04-04 ENCOUNTER — Encounter (HOSPITAL_COMMUNITY): Payer: Self-pay

## 2018-04-04 ENCOUNTER — Emergency Department (HOSPITAL_COMMUNITY): Payer: Medicare Other

## 2018-04-04 ENCOUNTER — Telehealth: Payer: Self-pay | Admitting: Internal Medicine

## 2018-04-04 ENCOUNTER — Emergency Department (HOSPITAL_COMMUNITY)
Admission: EM | Admit: 2018-04-04 | Discharge: 2018-04-04 | Disposition: A | Discharge status: Home / Self Care | Payer: Medicare Other | Attending: Emergency Medicine | Admitting: Emergency Medicine

## 2018-04-04 DIAGNOSIS — Z743 Need for continuous supervision: Secondary | ICD-10-CM | POA: Diagnosis not present

## 2018-04-04 DIAGNOSIS — R197 Diarrhea, unspecified: Secondary | ICD-10-CM | POA: Diagnosis not present

## 2018-04-04 DIAGNOSIS — R1031 Right lower quadrant pain: Secondary | ICD-10-CM | POA: Diagnosis not present

## 2018-04-04 DIAGNOSIS — S0990XA Unspecified injury of head, initial encounter: Secondary | ICD-10-CM | POA: Diagnosis not present

## 2018-04-04 DIAGNOSIS — R0609 Other forms of dyspnea: Secondary | ICD-10-CM | POA: Insufficient documentation

## 2018-04-04 DIAGNOSIS — R51 Headache: Secondary | ICD-10-CM | POA: Diagnosis not present

## 2018-04-04 DIAGNOSIS — E876 Hypokalemia: Secondary | ICD-10-CM

## 2018-04-04 DIAGNOSIS — J449 Chronic obstructive pulmonary disease, unspecified: Secondary | ICD-10-CM | POA: Diagnosis not present

## 2018-04-04 DIAGNOSIS — Z87891 Personal history of nicotine dependence: Secondary | ICD-10-CM | POA: Diagnosis not present

## 2018-04-04 DIAGNOSIS — I10 Essential (primary) hypertension: Secondary | ICD-10-CM | POA: Diagnosis not present

## 2018-04-04 DIAGNOSIS — I130 Hypertensive heart and chronic kidney disease with heart failure and stage 1 through stage 4 chronic kidney disease, or unspecified chronic kidney disease: Secondary | ICD-10-CM | POA: Diagnosis not present

## 2018-04-04 DIAGNOSIS — Z79899 Other long term (current) drug therapy: Secondary | ICD-10-CM | POA: Diagnosis not present

## 2018-04-04 DIAGNOSIS — K5730 Diverticulosis of large intestine without perforation or abscess without bleeding: Secondary | ICD-10-CM | POA: Diagnosis not present

## 2018-04-04 DIAGNOSIS — I5042 Chronic combined systolic (congestive) and diastolic (congestive) heart failure: Secondary | ICD-10-CM | POA: Insufficient documentation

## 2018-04-04 DIAGNOSIS — R0600 Dyspnea, unspecified: Secondary | ICD-10-CM | POA: Diagnosis not present

## 2018-04-04 DIAGNOSIS — Z950 Presence of cardiac pacemaker: Secondary | ICD-10-CM | POA: Insufficient documentation

## 2018-04-04 DIAGNOSIS — R0602 Shortness of breath: Secondary | ICD-10-CM | POA: Diagnosis not present

## 2018-04-04 DIAGNOSIS — N183 Chronic kidney disease, stage 3 (moderate): Secondary | ICD-10-CM | POA: Diagnosis not present

## 2018-04-04 LAB — COMPREHENSIVE METABOLIC PANEL
ALT: 17 U/L (ref 0–44)
AST: 27 U/L (ref 15–41)
Albumin: 4.2 g/dL (ref 3.5–5.0)
Alkaline Phosphatase: 58 U/L (ref 38–126)
Anion gap: 13 (ref 5–15)
BILIRUBIN TOTAL: 0.9 mg/dL (ref 0.3–1.2)
BUN: 28 mg/dL — ABNORMAL HIGH (ref 8–23)
CO2: 26 mmol/L (ref 22–32)
Calcium: 8.6 mg/dL — ABNORMAL LOW (ref 8.9–10.3)
Chloride: 101 mmol/L (ref 98–111)
Creatinine, Ser: 1.68 mg/dL — ABNORMAL HIGH (ref 0.44–1.00)
GFR calc Af Amer: 31 mL/min — ABNORMAL LOW (ref >60)
GFR calc non Af Amer: 27 mL/min — ABNORMAL LOW (ref >60)
Glucose, Bld: 95 mg/dL (ref 70–99)
Potassium: 2.7 mmol/L — CL (ref 3.5–5.1)
Sodium: 140 mmol/L (ref 135–145)
Total Protein: 7.4 g/dL (ref 6.5–8.1)

## 2018-04-04 LAB — CBC WITH DIFFERENTIAL/PLATELET
Abs Immature Granulocytes: 0.02 10*3/uL (ref 0.00–0.07)
Basophils Absolute: 0 10*3/uL (ref 0.0–0.1)
Basophils Relative: 0 %
Eosinophils Absolute: 0.1 10*3/uL (ref 0.0–0.5)
Eosinophils Relative: 2 %
HCT: 42.7 % (ref 36.0–46.0)
Hemoglobin: 14 g/dL (ref 12.0–15.0)
Immature Granulocytes: 0 %
Lymphocytes Relative: 16 %
Lymphs Abs: 1 10*3/uL (ref 0.7–4.0)
MCH: 33.9 pg (ref 26.0–34.0)
MCHC: 32.8 g/dL (ref 30.0–36.0)
MCV: 103.4 fL — ABNORMAL HIGH (ref 80.0–100.0)
Monocytes Absolute: 0.5 10*3/uL (ref 0.1–1.0)
Monocytes Relative: 8 %
NEUTROS ABS: 4.7 10*3/uL (ref 1.7–7.7)
Neutrophils Relative %: 74 %
PLATELETS: 165 10*3/uL (ref 150–400)
RBC: 4.13 MIL/uL (ref 3.87–5.11)
RDW: 13.7 % (ref 11.5–15.5)
WBC: 6.4 10*3/uL (ref 4.0–10.5)
nRBC: 0 % (ref 0.0–0.2)

## 2018-04-04 LAB — URINALYSIS, ROUTINE W REFLEX MICROSCOPIC
Bilirubin Urine: NEGATIVE
Glucose, UA: NEGATIVE mg/dL
HGB URINE DIPSTICK: NEGATIVE
Ketones, ur: NEGATIVE mg/dL
Leukocytes, UA: NEGATIVE
Nitrite: NEGATIVE
Protein, ur: NEGATIVE mg/dL
Specific Gravity, Urine: 1.009 (ref 1.005–1.030)
pH: 7 (ref 5.0–8.0)

## 2018-04-04 LAB — TROPONIN I: Troponin I: <0.03 ng/mL (ref <0.03)

## 2018-04-04 LAB — BRAIN NATRIURETIC PEPTIDE: B Natriuretic Peptide: 178 pg/mL — ABNORMAL HIGH (ref 0.0–100.0)

## 2018-04-04 LAB — MAGNESIUM: Magnesium: 1.9 mg/dL (ref 1.7–2.4)

## 2018-04-04 MED ORDER — IOPAMIDOL (ISOVUE-300) INJECTION 61%: 30.0000 mL | Freq: Once | INTRAVENOUS | Status: DC | PRN

## 2018-04-04 MED ORDER — ALBUTEROL SULFATE (2.5 MG/3ML) 0.083% IN NEBU
5.0000 mg | INHALATION_SOLUTION | Freq: Once | RESPIRATORY_TRACT | Status: AC
  Administered 2018-04-04: 5 mg via RESPIRATORY_TRACT
  Filled 2018-04-04: qty 6

## 2018-04-04 MED ORDER — POTASSIUM CHLORIDE 10 MEQ/100ML IV SOLN
10.0000 meq | INTRAVENOUS | Status: AC
  Administered 2018-04-04 (×2): 10 meq via INTRAVENOUS
  Filled 2018-04-04 (×2): qty 100

## 2018-04-04 MED ORDER — POTASSIUM CHLORIDE CRYS ER 20 MEQ PO TBCR: 20.0000 meq | EXTENDED_RELEASE_TABLET | Freq: Every day | ORAL | 0 refills | Status: DC

## 2018-04-04 NOTE — Telephone Encounter (Signed)
Cardiology Moonlighter Note  Returned page from patient. Patient has been having pain in her R groin for many months. Has spoken with multiple medical providers about this and feels as though she has not been given a straight answer. Feeling frustrated that she doesn't have an answer to her symptoms. Has called an ambulance but was told that they can only bring her to [facility] Patient wants to come to [facility]. She has no nearby friends or family that could driver her. She herself does not drive. Wants suggestions. I informed the patient that unfortunately there is nothing that I can do to help transport her to [facility]. Patient states she will be calling the [facility] to see if they can transport her.   Patient is asymptomatic from a cardiopulmonary standpoint at the current time. I recommended that the patient consider contacting one of her outpatient providers tomorrow. She states she would like to be seen tonight.  Marcie Mowers, MD Cardiology Fellow, PGY-6

## 2018-04-04 NOTE — ED Notes (Signed)
Date and time results received: 04/04/18  1429 (use smartphrase ".now" to insert current time)  Test:Potassium Critical Value:2.7  Name of Provider Notified:Dr Lita Mains Orders Received? Or Actions Taken?:NA

## 2018-04-04 NOTE — ED Triage Notes (Signed)
Pt brought to ED via [facility] for shortness of breath which she has a lot but seems more this morning. CBG 143. BP for EMS 186/96, 180/98. Pt states she is also having right side lower abdominal pain which has been going on "a long time."

## 2018-04-04 NOTE — Telephone Encounter (Signed)
   Pt called to report that she has been having tachypalpitations and cannot get her device to send a remote transmission.  I advised that even if she could send a remote transmission today, we are not able to evaluate it.  If she is feeling poorly, she will need to come into the ED today for evaluation.  She says that she is very bothered by palpitations and plans to call EMS and come to the ED today.   Murray Hodgkins, NP 04/04/2018, 10:02 AM

## 2018-04-04 NOTE — ED Provider Notes (Signed)
[facility] EMERGENCY DEPARTMENT Provider Note   CSN: [id number] Arrival date & time: 04/04/18  1100     History   Chief Complaint Chief Complaint  Patient presents with  . Shortness of Breath    HPI Kimberly [name] is a 84 y.o. female.  HPI Patient with multiple complaints.  States she is been having several weeks of shortness of breath that is worsened over the last week.  She has a cough with minimal sputum production.  Denies any fever or chills.  States she spell several days ago has been having a headache since.  She also has some bruising to her right side.  Also complains of abdominal pain which is been present for "sometime".  States she has frequent bouts of constipation.  Denies any blood in stool.  No urinary symptoms. Past Medical History:  Diagnosis Date  . Acute blood loss anemia 10/11/2012  . Acute diverticulitis 08/24/2013  . Acute on chronic combined systolic and diastolic CHF, NYHA class 4 ([place]) 11/15/2013  . Antral ulcer 10/11/2012  . Arrhythmia    atrial fibb  . Atrial fibrillation ([place])   . Cardiomyopathy, nonischemic ([place])   . Chronic anticoagulation 10/12/2012  . CKD (chronic kidney disease) stage 3, GFR 30-59 ml/min (HCC) 10/12/2012  . Depression   . Erosive esophagitis 10/11/2012  . Fibromyalgia   . Glaucoma   . H/O echocardiogram 2007   EF 40-45%,         . Hypertension   . Osteoarthritis   . Pacemaker    Last saw cards 07/2013  . Scoliosis     Patient Active Problem List   Diagnosis Date Noted  . Enteritis of small intestine due to enterotoxigenic Escherichia coli associated with diarrhea 03/24/2017  . Bipolar affective disorder ([place]) 03/23/2017  . Olfactory hallucinations 03/23/2017  . Lower abdominal pain   . Lower GI bleed   . Hematochezia 03/01/2017  . Glaucoma 02/28/2017  . Pacemaker 03/21/2015  . Non-ischemic cardiomyopathy ([place]) 11/28/2014  . PUD (peptic ulcer disease) 06/14/2014  . History of bipolar disorder   . Hypokalemia 05/26/2014   . Fall at home 05/26/2014  . Chronic diastolic heart failure ([place]) 11/15/2013  . Complete heart block ([place]) 11/15/2013  . Inability to ambulate due to ankle or foot 09/05/2013  . Unspecified constipation 08/26/2013  . Anemia 08/25/2013  . CKD (chronic kidney disease) stage 3, GFR 30-59 ml/min (HCC) 10/12/2012  . Chronic anticoagulation 10/12/2012  . Osteoarthritis   . Permanent atrial fibrillation   . Biventricular cardiac pacemaker - [employer] Consulta   . Fibromyalgia   . Essential hypertension   . Depression     Past Surgical History:  Procedure Laterality Date  . ABDOMINAL HYSTERECTOMY    . APPENDECTOMY    . BACK SURGERY    . BIOPSY  03/03/2017   Procedure: BIOPSY;  Surgeon: Danie Binder, MD;  Location: [facility] ENDO SUITE;  Service: Endoscopy;;  gastric  . BREAST SURGERY    . CARDIAC CATHETERIZATION  12/08/2005   LAD AND LEFT MAIN WITH NO HIGH-GRADE STENOSIS. MILD DISEASE IN THE CX AND LAD SYSTEM. SEVERE LV DYSFUNCTION WITH DILATION OF THE LV. EF 15-20%. LV END-DIASTOLIC PRESSURE IS 90. +1 MR.  . CHOLECYSTECTOMY    . COLONOSCOPY N/A 03/03/2017   Procedure: COLONOSCOPY;  Surgeon: Danie Binder, MD;  Location: [facility] ENDO SUITE;  Service: Endoscopy;  Laterality: N/A;  . CYSTOSCOPY N/A 02/24/2013   Procedure: CYSTOSCOPY WITH URETHRAL DILITATION;  Surgeon: Marissa Nestle, MD;  Location: [facility] ORS;  Service: Urology;  Laterality: N/A;  . DOPPLER ECHOCARDIOGRAPHY N/A 05/30/2010   LV SIZE IS NORMAL. LV SYSTOLIC FUNCTION IS LOW NORMAL. EF=50-55%. MILD INFERIOR HYPOKINESIS.MILD TO MODERATE POSTERIOR WALL HYPOKINESIS.PACEMAKER LEAD IN THE RV. LA IS MILDLY DILATED. RA IS MODERATE TO SEVERLY DILATED. PACEMAKER LEAD IN THE RA. MILD CALCICICATION OF THE MV APPARATUS. MODERATE MR. MILD TO MODERATE TR. MILD PHTN.AV MILDLY SCLEROTIC.  [name] ESOPHAGOGASTRODUODENOSCOPY N/A 10/13/2012   Dr. Gala Romney: severe ulcerative reflux esophagitis, question of Barrett's but negative path, single deep prepyloric antral  ulcer, negative H.pylori  . ESOPHAGOGASTRODUODENOSCOPY N/A 03/03/2017   Procedure: ESOPHAGOGASTRODUODENOSCOPY (EGD);  Surgeon: Danie Binder, MD;  Location: [facility] ENDO SUITE;  Service: Endoscopy;  Laterality: N/A;  . HERNIA REPAIR     right inguinal hernia and umbilical  . LOWETR EXT VENOUS Bilateral 11-08-10   R & L- NO EVIDENCE OF THROMBUS OR THROMBOPHLEBITIS. THERE IS MILD AMOUNT OF SUBCUTANEOUS EDEMA NOTED WITHIN THE LEFT CALF AND ANKLE. R & L GSV AND SSV- NO VENOUS INSUFF NOTED.  [name] NECK SURGERY    . NUCLEAR STRESS TEST N/A 02/13/2009   NORMAL PATTERN OF PERFUSION IN ALL REGIONS. POST STRESS VENTICULAR SIZE IS NORMAL. POST  STESS EF 85%.  NORMAL MYOCARDIAL PERFUSION STUDY.  [name] PACEMAKER INSERTION    . POLYPECTOMY  03/03/2017   Procedure: POLYPECTOMY;  Surgeon: Danie Binder, MD;  Location: [facility] ENDO SUITE;  Service: Endoscopy;;  colon  . TONSILLECTOMY    . YAG LASER APPLICATION Bilateral 0/25/4270   Procedure: YAG LASER APPLICATION;  Surgeon: Williams Che, MD;  Location: [facility] ORS;  Service: Ophthalmology;  Laterality: Bilateral;     OB History    Gravida  5   Para  2   Term  1   Preterm  1   AB  3   Living  2     SAB  3   TAB      Ectopic      Multiple      Live Births               Home Medications    Prior to Admission medications   Medication Sig Start Date End Date Taking? Authorizing Provider  acetaminophen (TYLENOL) 500 MG tablet Take 1,000 mg by mouth every 6 (six) hours as needed for mild pain or headache.   Yes [provider]  carvedilol (COREG) 6.25 MG tablet TAKE 1 TABLET BY MOUTH 2  TIMES DAILY WITH A MEAL 03/29/18  Yes Croitoru, Mihai, MD  ELIQUIS 2.5 MG TABS tablet TAKE 1 TABLET BY MOUTH  TWICE A DAY 03/29/18  Yes Croitoru, Mihai, MD  furosemide (LASIX) 20 MG tablet TAKE 1 TABLET BY MOUTH  DAILY 03/29/18  Yes Croitoru, Mihai, MD  mirtazapine (REMERON) 30 MG tablet Take 1 tablet (30 mg total) by mouth at bedtime. 03/29/18  Yes  Dettinger, Fransisca Kaufmann, MD  Multiple Vitamins-Minerals (PRESERVISION AREDS 2) CAPS Take 1 capsule daily by mouth.   Yes [provider]  Omega-3 Fatty Acids (FISH OIL PEARLS) 300 MG CAPS Take 1 tablet by mouth daily.   Yes [provider]  pantoprazole (PROTONIX) 20 MG tablet Take 20 mg by mouth daily.   Yes [provider]  PARoxetine (PAXIL) 20 MG tablet Take 1 tablet (20 mg total) by mouth daily. 03/16/18  Yes Dettinger, Fransisca Kaufmann, MD  Polyethyl Glycol-Propyl Glycol (SYSTANE ULTRA) 0.4-0.3 % SOLN Place 1-2 drops 2 (two) times daily as needed into both  eyes (dry eyes).    Yes [provider]  sodium chloride (OCEAN) 0.65 % SOLN nasal spray Place 1 spray 2 (two) times daily as needed into both nostrils for congestion.    Yes [provider]  potassium chloride SA (K-DUR,KLOR-CON) 20 MEQ tablet Take 1 tablet (20 mEq total) by mouth daily. 04/05/18   Julianne Rice, MD    Family History Family History  Problem Relation Age of Onset  . Cancer Sister   . Asthma Sister   . Heart failure Brother   . Pulmonary embolism Brother   . Cancer Brother   . Colon cancer Neg Hx     Social History Social History   Tobacco Use  . Smoking status: Former Smoker    Packs/day: 0.50    Years: 30.00    Pack years: 15.00    Types: Cigarettes    Last attempt to quit: 12/13/2004    Years since quitting: 13.3  . Smokeless tobacco: Never Used  . Tobacco comment: former smoker  Substance Use Topics  . Alcohol use: Yes    Alcohol/week: 0.0 standard drinks    Comment: history of drinking a 1/2 glass of wine in the evening  . Drug use: No     Allergies   Ciprofloxacin; Flagyl [metronidazole]; Penicillins; and Sulfa antibiotics   Review of Systems Review of Systems  Constitutional: Negative for chills and fever.  HENT: Negative for congestion and trouble swallowing.   Eyes: Negative for visual disturbance.  Respiratory: Positive for cough and shortness of  breath.   Cardiovascular: Negative for chest pain.  Gastrointestinal: Positive for abdominal pain and constipation. Negative for diarrhea, nausea and vomiting.  Genitourinary: Negative for dysuria, flank pain and frequency.  Musculoskeletal: Negative for back pain, myalgias and neck pain.  Skin: Positive for wound. Negative for rash.  Neurological: Positive for headaches. Negative for dizziness, weakness, light-headedness and numbness.  All other systems reviewed and are negative.    Physical Exam Updated Vital Signs BP (!) 148/67   Pulse 75   Temp 97.6 F (36.4 C) (Oral)   Resp 18   Ht 5\' 6"  (1.676 m)   Wt 64 kg   SpO2 99%   BMI 22.76 kg/m   Physical Exam  Constitutional: She is oriented to person, place, and time. She appears well-developed and well-nourished. No distress.  HENT:  Head: Normocephalic and atraumatic.  Mouth/Throat: Oropharynx is clear and moist. No oropharyngeal exudate.  Eyes: Pupils are equal, round, and reactive to light. EOM are normal.  Neck: Normal range of motion. Neck supple. No JVD present.  No posterior midline cervical tenderness to palpation.  Cardiovascular: Regular rhythm.  Irregular  Pulmonary/Chest: Effort normal and breath sounds normal.  No respiratory distress.  Expiratory wheezing  Abdominal: Soft. Bowel sounds are normal. There is tenderness. There is no rebound and no guarding.  Mild right lower quadrant tenderness to palpation without rebound or guarding.  Abdomen is diffusely distended.  Musculoskeletal: Normal range of motion. She exhibits no edema or tenderness.  No lower extremity swelling, asymmetry or tenderness.  Lymphadenopathy:    She has no cervical adenopathy.  Neurological: She is alert and oriented to person, place, and time.  Moving all extremities without focal deficit.  Sensation fully intact.  Skin: Skin is warm and dry. No rash noted. She is not diaphoretic. No erythema.  Few scattered bruises to the right upper  extremity.    Psychiatric: She has a normal mood and affect. Her behavior is normal.  Nursing note and vitals reviewed.    ED Treatments / Results  Labs (all labs ordered are listed, but only abnormal results are displayed) Labs Reviewed  CBC WITH DIFFERENTIAL/PLATELET - Abnormal; Notable for the following components:      Result Value   MCV 103.4 (*)    All other components within normal limits  COMPREHENSIVE METABOLIC PANEL - Abnormal; Notable for the following components:   Potassium 2.7 (*)    BUN 28 (*)    Creatinine, Ser 1.68 (*)    Calcium 8.6 (*)    GFR calc non Af Amer 27 (*)    GFR calc Af Amer 31 (*)    All other components within normal limits  BRAIN NATRIURETIC PEPTIDE - Abnormal; Notable for the following components:   B Natriuretic Peptide 178.0 (*)    All other components within normal limits  URINALYSIS, ROUTINE W REFLEX MICROSCOPIC - Abnormal; Notable for the following components:   Color, Urine STRAW (*)    All other components within normal limits  MAGNESIUM  TROPONIN I    EKG EKG Interpretation  Date/Time:  Sunday April 04 2018 11:15:40 EST Ventricular Rate:  77 PR Interval:    QRS Duration: 132 QT Interval:  488 QTC Calculation: 553 R Axis:   0 Text Interpretation:  Ventricular-paced complexes No further analysis attempted due to paced rhythm Confirmed by Julianne Rice [phone]) on 04/04/2018 12:12:42 PM   Radiology Ct Abdomen Pelvis Wo Contrast  Result Date: 04/04/2018 CLINICAL DATA:  Chronic right lower abdominal pain. EXAM: CT ABDOMEN AND PELVIS WITHOUT CONTRAST TECHNIQUE: Multidetector CT imaging of the abdomen and pelvis was performed following the standard protocol without IV contrast. COMPARISON:  CT abdomen and pelvis 04/02/2017. FINDINGS: Lower chest: There is mild cardiomegaly. Leads from pacing device are noted. No pleural or pericardial effusion. Lungs demonstrate only mild dependent atelectasis. Hepatobiliary: Status post  cholecystectomy. A few scattered cysts are identified in the liver. The liver is otherwise unremarkable. Biliary tree appears normal. Pancreas: Unremarkable. No pancreatic ductal dilatation or surrounding inflammatory changes. Spleen: Normal in size without focal abnormality. Adrenals/Urinary Tract: 2-3 punctate nonobstructing stones are present in the right kidney. The kidneys otherwise appear normal. Ureters and urinary bladder appear normal. The adrenal glands are normal appearance. Stomach/Bowel: Sigmoid diverticulosis without diverticulitis is identified. The appendix has been removed. Small bowel is unremarkable. The patient has a small to moderate hiatal hernia. Vascular/Lymphatic: Extensive atherosclerotic vascular disease is present. No lymphadenopathy. Reproductive: Status post hysterectomy. No adnexal masses. Other: None. Musculoskeletal: No acute or focal bony abnormality. Remote T12 compression fracture is unchanged. There is severe convex right thoracolumbar scoliosis with the apex at L1-2. IMPRESSION: No acute abnormality abdomen or pelvis. No finding to explain the patient's symptoms. Extensive calcific aortic and coronary atherosclerosis. Diverticulosis without diverticulitis. 2-3 punctate nonobstructing stones right kidney. Small to moderate hiatal hernia. Severe convex right thoracolumbar scoliosis and multilevel degenerative disease. Electronically Signed   By: Inge Rise M.D.   On: 04/04/2018 16:56   Dg Chest 2 View  Result Date: 04/04/2018 CLINICAL DATA:  Chronic shortness of breath which worsened this morning. EXAM: CHEST - 2 VIEW COMPARISON:  Single-view of the chest 04/02/2017. PA and lateral chest 07/01/2015. FINDINGS: The chest is hyperexpanded with attenuation of the pulmonary vasculature. There is mild apical scarring. No consolidative process, pneumothorax or effusion. Heart size is mildly enlarged. Aortic atherosclerosis is noted. AICD is in place. No acute bony abnormality.  IMPRESSION: No acute disease. COPD. Atherosclerosis. Electronically Signed  By: Inge Rise M.D.   On: 04/04/2018 12:53   Ct Head Wo Contrast  Result Date: 04/04/2018 CLINICAL DATA:  Minor head trauma.  On anticoagulant. EXAM: CT HEAD WITHOUT CONTRAST TECHNIQUE: Contiguous axial images were obtained from the base of the skull through the vertex without intravenous contrast. COMPARISON:  03/26/2017. FINDINGS: Brain: No evidence of acute infarction, hemorrhage, hydrocephalus, extra-axial collection or mass lesion/mass effect. There is mild patchy low-attenuation within the subcortical and periventricular white matter compatible with chronic microvascular disease. Vascular: No hyperdense vessel or unexpected calcification. Skull: Normal. Negative for fracture or focal lesion. Sinuses/Orbits: No acute finding. Other: None. IMPRESSION: 1. No acute intracranial abnormalities. 2. Chronic small vessel ischemic disease. Electronically Signed   By: Kerby Moors M.D.   On: 04/04/2018 14:48    Procedures Procedures (including critical care time)  Medications Ordered in ED Medications  albuterol (PROVENTIL) (2.5 MG/3ML) 0.083% nebulizer solution 5 mg (5 mg Nebulization Given 04/04/18 1334)  potassium chloride 10 mEq in 100 mL IVPB (0 mEq Intravenous Stopped 04/04/18 1715)     Initial Impression / Assessment and Plan / ED Course  I have reviewed the triage vital signs and the nursing notes.  Pertinent labs & imaging results that were available during my care of the patient were reviewed by me and considered in my medical decision making (see chart for details).     CT head and abdomen pelvis without acute findings.  Patient has baseline chronic kidney disease.  Patient with hyperkalemia and given potassium replacement.  Vital signs remained stable.  Abdominal exam is benign.  Believe the patient is been appropriately screened and can follow-up with her primary physician.  Return precautions  given.  Final Clinical Impressions(s) / ED Diagnoses   Final diagnoses:  Dyspnea on exertion  Hypokalemia  Right lower quadrant abdominal pain    ED Discharge Orders         Ordered    potassium chloride SA (K-DUR,KLOR-CON) 20 MEQ tablet  Daily     04/04/18 1759           Julianne Rice, MD 04/05/18 1644

## 2018-04-06 ENCOUNTER — Telehealth: Payer: Self-pay | Admitting: Family Medicine

## 2018-04-06 ENCOUNTER — Ambulatory Visit: Payer: Medicare Other | Admitting: Family

## 2018-04-06 ENCOUNTER — Telehealth: Payer: Self-pay

## 2018-04-06 ENCOUNTER — Ambulatory Visit: Payer: Medicare Other | Admitting: Nurse Practitioner

## 2018-04-06 DIAGNOSIS — B351 Tinea unguium: Secondary | ICD-10-CM | POA: Diagnosis not present

## 2018-04-06 DIAGNOSIS — M79676 Pain in unspecified toe(s): Secondary | ICD-10-CM | POA: Diagnosis not present

## 2018-04-06 NOTE — Telephone Encounter (Signed)
Patient is coming in to be seen.

## 2018-04-06 NOTE — Telephone Encounter (Signed)
LMOVM reminding pt to send remote transmission.   

## 2018-04-07 ENCOUNTER — Telehealth: Payer: Self-pay

## 2018-04-07 ENCOUNTER — Telehealth: Payer: Self-pay | Admitting: Medical

## 2018-04-07 DIAGNOSIS — R0602 Shortness of breath: Secondary | ICD-10-CM | POA: Diagnosis not present

## 2018-04-07 DIAGNOSIS — M1990 Unspecified osteoarthritis, unspecified site: Secondary | ICD-10-CM | POA: Diagnosis not present

## 2018-04-07 DIAGNOSIS — R079 Chest pain, unspecified: Secondary | ICD-10-CM | POA: Diagnosis not present

## 2018-04-07 DIAGNOSIS — R1110 Vomiting, unspecified: Secondary | ICD-10-CM | POA: Diagnosis not present

## 2018-04-07 DIAGNOSIS — Z950 Presence of cardiac pacemaker: Secondary | ICD-10-CM | POA: Diagnosis not present

## 2018-04-07 DIAGNOSIS — R58 Hemorrhage, not elsewhere classified: Secondary | ICD-10-CM | POA: Diagnosis not present

## 2018-04-07 DIAGNOSIS — I10 Essential (primary) hypertension: Secondary | ICD-10-CM | POA: Diagnosis not present

## 2018-04-07 DIAGNOSIS — R0789 Other chest pain: Secondary | ICD-10-CM | POA: Diagnosis not present

## 2018-04-07 DIAGNOSIS — K5730 Diverticulosis of large intestine without perforation or abscess without bleeding: Secondary | ICD-10-CM | POA: Diagnosis not present

## 2018-04-07 DIAGNOSIS — Z880 Allergy status to penicillin: Secondary | ICD-10-CM | POA: Diagnosis not present

## 2018-04-07 DIAGNOSIS — M791 Myalgia, unspecified site: Secondary | ICD-10-CM | POA: Diagnosis not present

## 2018-04-07 DIAGNOSIS — K449 Diaphragmatic hernia without obstruction or gangrene: Secondary | ICD-10-CM | POA: Diagnosis not present

## 2018-04-07 DIAGNOSIS — Z888 Allergy status to other drugs, medicaments and biological substances status: Secondary | ICD-10-CM | POA: Diagnosis not present

## 2018-04-07 DIAGNOSIS — Z9049 Acquired absence of other specified parts of digestive tract: Secondary | ICD-10-CM | POA: Diagnosis not present

## 2018-04-07 DIAGNOSIS — Z7902 Long term (current) use of antithrombotics/antiplatelets: Secondary | ICD-10-CM | POA: Diagnosis not present

## 2018-04-07 DIAGNOSIS — Z79899 Other long term (current) drug therapy: Secondary | ICD-10-CM | POA: Diagnosis not present

## 2018-04-07 DIAGNOSIS — Z743 Need for continuous supervision: Secondary | ICD-10-CM | POA: Diagnosis not present

## 2018-04-07 DIAGNOSIS — R109 Unspecified abdominal pain: Secondary | ICD-10-CM | POA: Diagnosis not present

## 2018-04-07 DIAGNOSIS — R0600 Dyspnea, unspecified: Secondary | ICD-10-CM | POA: Diagnosis not present

## 2018-04-07 DIAGNOSIS — I4891 Unspecified atrial fibrillation: Secondary | ICD-10-CM | POA: Diagnosis not present

## 2018-04-07 DIAGNOSIS — M810 Age-related osteoporosis without current pathological fracture: Secondary | ICD-10-CM | POA: Diagnosis not present

## 2018-04-07 DIAGNOSIS — R51 Headache: Secondary | ICD-10-CM | POA: Diagnosis not present

## 2018-04-07 DIAGNOSIS — Z91041 Radiographic dye allergy status: Secondary | ICD-10-CM | POA: Diagnosis not present

## 2018-04-07 DIAGNOSIS — M419 Scoliosis, unspecified: Secondary | ICD-10-CM | POA: Diagnosis not present

## 2018-04-07 DIAGNOSIS — K625 Hemorrhage of anus and rectum: Secondary | ICD-10-CM | POA: Diagnosis not present

## 2018-04-07 DIAGNOSIS — I2510 Atherosclerotic heart disease of native coronary artery without angina pectoris: Secondary | ICD-10-CM | POA: Diagnosis not present

## 2018-04-07 DIAGNOSIS — Z87891 Personal history of nicotine dependence: Secondary | ICD-10-CM | POA: Diagnosis not present

## 2018-04-07 NOTE — Telephone Encounter (Signed)
Received message from answering service that patient called stating "its an emergency, please hurry. I have a big problem". Upon returning the patients call she reports having chest pain this morning, diarrhea, and rectal bleeding. She has not activated EMS yet but is adamant that she not be transported to [facility] (a complaint she had on 04/04/18 when she called the answering service). I informed the patient that I have no control over where EMS transports patients. I instructed her to call EMS in order to get further medical attention. She stated she would do so and ask to be transporting to [facility].

## 2018-04-07 NOTE — Telephone Encounter (Signed)
Pt called stating to the Scheduler AV that  her monitor is broken and [employer] sending her a new monitor. The monitor should be here in a couple of weeks.

## 2018-04-08 ENCOUNTER — Other Ambulatory Visit: Payer: Self-pay

## 2018-04-08 DIAGNOSIS — F331 Major depressive disorder, recurrent, moderate: Secondary | ICD-10-CM

## 2018-04-08 DIAGNOSIS — I10 Essential (primary) hypertension: Secondary | ICD-10-CM | POA: Diagnosis not present

## 2018-04-08 DIAGNOSIS — R0789 Other chest pain: Secondary | ICD-10-CM | POA: Diagnosis not present

## 2018-04-09 ENCOUNTER — Encounter: Payer: Medicare Other | Admitting: Family Medicine

## 2018-04-09 ENCOUNTER — Telehealth: Payer: Self-pay | Admitting: Physician Assistant

## 2018-04-09 ENCOUNTER — Telehealth: Payer: Self-pay | Admitting: Family Medicine

## 2018-04-09 ENCOUNTER — Telehealth: Payer: Self-pay | Admitting: Cardiology

## 2018-04-09 DIAGNOSIS — R51 Headache: Secondary | ICD-10-CM | POA: Diagnosis not present

## 2018-04-09 DIAGNOSIS — R1084 Generalized abdominal pain: Secondary | ICD-10-CM | POA: Diagnosis not present

## 2018-04-09 DIAGNOSIS — S3992XA Unspecified injury of lower back, initial encounter: Secondary | ICD-10-CM | POA: Diagnosis not present

## 2018-04-09 DIAGNOSIS — Z743 Need for continuous supervision: Secondary | ICD-10-CM | POA: Diagnosis not present

## 2018-04-09 DIAGNOSIS — M79604 Pain in right leg: Secondary | ICD-10-CM | POA: Diagnosis not present

## 2018-04-09 DIAGNOSIS — Z7901 Long term (current) use of anticoagulants: Secondary | ICD-10-CM | POA: Diagnosis not present

## 2018-04-09 DIAGNOSIS — R079 Chest pain, unspecified: Secondary | ICD-10-CM | POA: Diagnosis not present

## 2018-04-09 DIAGNOSIS — I10 Essential (primary) hypertension: Secondary | ICD-10-CM | POA: Diagnosis not present

## 2018-04-09 DIAGNOSIS — Z79899 Other long term (current) drug therapy: Secondary | ICD-10-CM | POA: Diagnosis not present

## 2018-04-09 DIAGNOSIS — R0602 Shortness of breath: Secondary | ICD-10-CM | POA: Diagnosis not present

## 2018-04-09 DIAGNOSIS — R0789 Other chest pain: Secondary | ICD-10-CM | POA: Diagnosis not present

## 2018-04-09 DIAGNOSIS — Z87891 Personal history of nicotine dependence: Secondary | ICD-10-CM | POA: Diagnosis not present

## 2018-04-09 DIAGNOSIS — I4891 Unspecified atrial fibrillation: Secondary | ICD-10-CM | POA: Diagnosis not present

## 2018-04-09 DIAGNOSIS — G894 Chronic pain syndrome: Secondary | ICD-10-CM | POA: Diagnosis not present

## 2018-04-09 NOTE — Telephone Encounter (Signed)
Have her come in for a visit to discuss options, she said the Remeron was working for her so I do not know what changed but she can come in we can discuss this.

## 2018-04-09 NOTE — Telephone Encounter (Signed)
Kimberly [name]    PT is wanting something that isn't weak but strong that will help her sleep, she said that she use to take trazampam??

## 2018-04-09 NOTE — Telephone Encounter (Signed)
Paged by answering service for shortness of breath, edema and palpitation.  Patient states that she has severe labored breathing and hard time talking.  She says she was discharged from [facility] few days ago after her visit in ER 12/1.  Advised to come to nearest emergency room.  She will call EMS or police.

## 2018-04-09 NOTE — Telephone Encounter (Signed)
Pt aware and says she has an appt Monday with Monia Pouch and will talk to her about it.

## 2018-04-09 NOTE — Telephone Encounter (Signed)
Received a call from answering service that patient wanted a call back.  She had just talked to the evening PA on call for Cards with c/o SOB, palpitations and was told to call EMS and go to local ER.  Patient said that EMS would not take her to [facility] and she wanted me to call her.  I attempted to call her over a 19min time period and each time the phone was busy.  I called her daughter in [place] who told me she has been in and out of the ER recently with manic episodes (she is bipolar) and has been calling multiple doctors and nurses with a multitude of complaints as well as wanting sleep aides.  Nothing can be found wrong with her when evaluated in the ER.  She has a history of PPM and afib s/p AVN ablation and recent pacer check was fine.  I called [facility] to see if they would go out to check on her and they were already there and would assess patient and determine if she needed to go to ER and if so would be taken to local hospital.

## 2018-04-10 ENCOUNTER — Telehealth: Payer: Self-pay | Admitting: Cardiology

## 2018-04-10 DIAGNOSIS — M79604 Pain in right leg: Secondary | ICD-10-CM | POA: Diagnosis not present

## 2018-04-10 DIAGNOSIS — W19XXXA Unspecified fall, initial encounter: Secondary | ICD-10-CM | POA: Diagnosis not present

## 2018-04-10 DIAGNOSIS — R51 Headache: Secondary | ICD-10-CM | POA: Diagnosis not present

## 2018-04-10 DIAGNOSIS — R531 Weakness: Secondary | ICD-10-CM | POA: Diagnosis not present

## 2018-04-10 DIAGNOSIS — Z87891 Personal history of nicotine dependence: Secondary | ICD-10-CM | POA: Diagnosis not present

## 2018-04-10 DIAGNOSIS — I10 Essential (primary) hypertension: Secondary | ICD-10-CM | POA: Diagnosis not present

## 2018-04-10 DIAGNOSIS — Z79899 Other long term (current) drug therapy: Secondary | ICD-10-CM | POA: Diagnosis not present

## 2018-04-10 DIAGNOSIS — G894 Chronic pain syndrome: Secondary | ICD-10-CM | POA: Diagnosis not present

## 2018-04-10 NOTE — Telephone Encounter (Signed)
Ms. Bedgood called today complaining of swelling in her feet and a headache.  This is a third time she is called in 24 hours.  Each time she has been directed to go to the emergency room if she felt significantly short of breath or uncomfortable.  I suggested she try taking an extra Lasix for 2 doses.  She has Tylenol for her headache, I am hesitant to prescribe an anti-inflammatory because she has renal insufficiency.  She does have an appointment in our office on the 12th and will keep that.  She promises me she will go to the closest ER or or whatever ER EMS takes her to if she becomes significantly short of breath.  Kerin Ransom PA-C 04/10/2018 10:33 AM

## 2018-04-11 DIAGNOSIS — S3992XA Unspecified injury of lower back, initial encounter: Secondary | ICD-10-CM | POA: Diagnosis not present

## 2018-04-12 ENCOUNTER — Ambulatory Visit: Payer: Medicare Other | Admitting: Family Medicine

## 2018-04-13 ENCOUNTER — Telehealth: Payer: Self-pay | Admitting: Cardiovascular Disease

## 2018-04-13 ENCOUNTER — Telehealth: Payer: Self-pay | Admitting: Family Medicine

## 2018-04-13 NOTE — Telephone Encounter (Signed)
New Message:     Please call, she said she had a fall last month. She said she need you to relay some information to Dr C  please.[name]

## 2018-04-13 NOTE — Telephone Encounter (Signed)
LVM for return call to discuss remote transmission.

## 2018-04-13 NOTE — Telephone Encounter (Signed)
Patient called and stated that she has not set up her home monitor yet but she plans to do it tonight and will send the remote transmission tomorrow.

## 2018-04-13 NOTE — Telephone Encounter (Signed)
Understood MCr

## 2018-04-13 NOTE — Telephone Encounter (Signed)
Pt called to request that her appt with Dr. Sallyanne Kuster pacer check be cancelled for tomorrow which I noted was already cancelled,, I asked the pt a few questions since she was reporting a fall on 11/29 which is hindering her from coming in.. But the pt yelled at me to "shut up" and just listen. I advised her that we cannot let the appt go to long without rescheduling,, she says she will call back when she is ready and feels better. She did report that she did go to the ER after her fall and no reported broken bones or serious injury. She denies syncopal episode.. She says her "dumb stupid" doctor is not giving her the right meds to help her sleep and when she got up to use the bathroom it was dark and she tripped over something. I advised her to please call back when she feels better and to be sure that she is following up with her PMD for a treatment plan to get her better so she is not having to cancel important appts.[name] KitchenMarland KitchenShe verbalized understanding and agreed.  Pt never fully explained why she could not leave the house to come... Unable to get all of the information from her. She was aggravated and uncooperative on the phone.   Called pt daughter. Santiago Glad on [facility], she reports that the pt is bipolar and very head strong.. She says that she will not take any meds and she is aware that this a bad situation and will talk with the pt. Will notify our device clinic to see if we can monitor her pacer over the phone only in the meantime.

## 2018-04-14 ENCOUNTER — Telehealth: Payer: Self-pay | Admitting: Family Medicine

## 2018-04-14 DIAGNOSIS — I129 Hypertensive chronic kidney disease with stage 1 through stage 4 chronic kidney disease, or unspecified chronic kidney disease: Secondary | ICD-10-CM | POA: Diagnosis not present

## 2018-04-14 DIAGNOSIS — Z881 Allergy status to other antibiotic agents status: Secondary | ICD-10-CM | POA: Diagnosis not present

## 2018-04-14 DIAGNOSIS — Z882 Allergy status to sulfonamides status: Secondary | ICD-10-CM | POA: Diagnosis not present

## 2018-04-14 DIAGNOSIS — Z87891 Personal history of nicotine dependence: Secondary | ICD-10-CM | POA: Diagnosis not present

## 2018-04-14 DIAGNOSIS — Z7901 Long term (current) use of anticoagulants: Secondary | ICD-10-CM | POA: Diagnosis not present

## 2018-04-14 DIAGNOSIS — G44209 Tension-type headache, unspecified, not intractable: Secondary | ICD-10-CM | POA: Diagnosis not present

## 2018-04-14 DIAGNOSIS — M797 Fibromyalgia: Secondary | ICD-10-CM | POA: Diagnosis not present

## 2018-04-14 DIAGNOSIS — G4489 Other headache syndrome: Secondary | ICD-10-CM | POA: Diagnosis not present

## 2018-04-14 DIAGNOSIS — N189 Chronic kidney disease, unspecified: Secondary | ICD-10-CM | POA: Diagnosis not present

## 2018-04-14 DIAGNOSIS — Z79899 Other long term (current) drug therapy: Secondary | ICD-10-CM | POA: Diagnosis not present

## 2018-04-14 DIAGNOSIS — Z950 Presence of cardiac pacemaker: Secondary | ICD-10-CM | POA: Diagnosis not present

## 2018-04-14 DIAGNOSIS — I4891 Unspecified atrial fibrillation: Secondary | ICD-10-CM | POA: Diagnosis not present

## 2018-04-14 DIAGNOSIS — E1122 Type 2 diabetes mellitus with diabetic chronic kidney disease: Secondary | ICD-10-CM | POA: Diagnosis not present

## 2018-04-14 DIAGNOSIS — Z880 Allergy status to penicillin: Secondary | ICD-10-CM | POA: Diagnosis not present

## 2018-04-14 DIAGNOSIS — Z91041 Radiographic dye allergy status: Secondary | ICD-10-CM | POA: Diagnosis not present

## 2018-04-14 NOTE — Telephone Encounter (Signed)
LM for pt to call back for appt with Dr D - for review of this.

## 2018-04-14 NOTE — Telephone Encounter (Signed)
Patient called the emergency physician line at 0100 this morning to ask if the emergency physician could call her back so that she could find her cell phone.  She was instructed at this time that this was inappropriate.  We are calling her to have her in for an appointment to readdress insomnia and discuss clinic policies and after our phone call policies. Caryl Pina, MD [facility] 04/14/2018, 8:24 AM

## 2018-04-14 NOTE — Telephone Encounter (Signed)
Pt has not had a F2F appt for orders to be sent to [facility]. Pt did not come to 04/12/18 appt.

## 2018-04-15 ENCOUNTER — Other Ambulatory Visit: Payer: Self-pay | Admitting: Family Medicine

## 2018-04-15 ENCOUNTER — Encounter: Payer: Medicare Other | Admitting: Cardiovascular Disease

## 2018-04-15 NOTE — Telephone Encounter (Signed)
Advised pt she would ntbs to discuss with provider and pt declined and said "so long" and hung up the phone.

## 2018-04-15 NOTE — Telephone Encounter (Signed)
Spoke w/ pt and informed her that we still have not received her remote transmission. She states that she still has not set up the monitor. But she will definitely  send on Friday 04-16-18.

## 2018-04-16 DIAGNOSIS — R51 Headache: Secondary | ICD-10-CM | POA: Diagnosis not present

## 2018-04-16 DIAGNOSIS — Z79899 Other long term (current) drug therapy: Secondary | ICD-10-CM | POA: Diagnosis not present

## 2018-04-16 DIAGNOSIS — W19XXXA Unspecified fall, initial encounter: Secondary | ICD-10-CM | POA: Diagnosis not present

## 2018-04-16 DIAGNOSIS — R001 Bradycardia, unspecified: Secondary | ICD-10-CM | POA: Diagnosis not present

## 2018-04-16 DIAGNOSIS — I10 Essential (primary) hypertension: Secondary | ICD-10-CM | POA: Diagnosis not present

## 2018-04-16 DIAGNOSIS — I959 Hypotension, unspecified: Secondary | ICD-10-CM | POA: Diagnosis not present

## 2018-04-16 DIAGNOSIS — R531 Weakness: Secondary | ICD-10-CM | POA: Diagnosis not present

## 2018-04-16 DIAGNOSIS — G44209 Tension-type headache, unspecified, not intractable: Secondary | ICD-10-CM | POA: Diagnosis not present

## 2018-04-17 ENCOUNTER — Telehealth: Payer: Self-pay | Admitting: Internal Medicine

## 2018-04-17 NOTE — Telephone Encounter (Signed)
Spoke with patient regarding ankle swelling. She is otherwise feeling at her baseline. She recently had a potassium of 2.7 on lab work and despite being prescribed potassium supplementation, reports not ever receiving or taking these supplements. She has not gained any weight. I advised she be seen in urgent care or emergency room to assess her volume status and potassium level prior to more aggressive diuresis but she refused. I instructed her on high potassium food/drinks to consume and to call us back if her ankle swelling is worsening at all tomorrow. If she is stable over the weekend, she agreed to see her doctor on Monday to assess her electrolytes and volume status.

## 2018-04-18 ENCOUNTER — Encounter (HOSPITAL_COMMUNITY): Payer: Self-pay | Admitting: Emergency Medicine

## 2018-04-18 ENCOUNTER — Emergency Department (HOSPITAL_COMMUNITY)
Admission: EM | Admit: 2018-04-18 | Discharge: 2018-04-18 | Disposition: A | Discharge status: Home / Self Care | Payer: Medicare Other | Attending: Emergency Medicine | Admitting: Emergency Medicine

## 2018-04-18 ENCOUNTER — Emergency Department (HOSPITAL_COMMUNITY): Payer: Medicare Other

## 2018-04-18 ENCOUNTER — Telehealth: Payer: Self-pay | Admitting: Cardiology

## 2018-04-18 DIAGNOSIS — R296 Repeated falls: Secondary | ICD-10-CM | POA: Insufficient documentation

## 2018-04-18 DIAGNOSIS — I5042 Chronic combined systolic (congestive) and diastolic (congestive) heart failure: Secondary | ICD-10-CM | POA: Diagnosis not present

## 2018-04-18 DIAGNOSIS — M791 Myalgia, unspecified site: Secondary | ICD-10-CM | POA: Insufficient documentation

## 2018-04-18 DIAGNOSIS — T148XXA Other injury of unspecified body region, initial encounter: Secondary | ICD-10-CM | POA: Diagnosis not present

## 2018-04-18 DIAGNOSIS — I130 Hypertensive heart and chronic kidney disease with heart failure and stage 1 through stage 4 chronic kidney disease, or unspecified chronic kidney disease: Secondary | ICD-10-CM | POA: Diagnosis not present

## 2018-04-18 DIAGNOSIS — Z87891 Personal history of nicotine dependence: Secondary | ICD-10-CM | POA: Insufficient documentation

## 2018-04-18 DIAGNOSIS — N183 Chronic kidney disease, stage 3 (moderate): Secondary | ICD-10-CM | POA: Diagnosis not present

## 2018-04-18 DIAGNOSIS — S2020XA Contusion of thorax, unspecified, initial encounter: Secondary | ICD-10-CM | POA: Diagnosis not present

## 2018-04-18 DIAGNOSIS — T07XXXA Unspecified multiple injuries, initial encounter: Secondary | ICD-10-CM

## 2018-04-18 DIAGNOSIS — Z950 Presence of cardiac pacemaker: Secondary | ICD-10-CM | POA: Insufficient documentation

## 2018-04-18 DIAGNOSIS — S9002XA Contusion of left ankle, initial encounter: Secondary | ICD-10-CM | POA: Diagnosis not present

## 2018-04-18 DIAGNOSIS — Z7901 Long term (current) use of anticoagulants: Secondary | ICD-10-CM | POA: Diagnosis not present

## 2018-04-18 DIAGNOSIS — Z79899 Other long term (current) drug therapy: Secondary | ICD-10-CM | POA: Insufficient documentation

## 2018-04-18 DIAGNOSIS — I4891 Unspecified atrial fibrillation: Secondary | ICD-10-CM | POA: Diagnosis not present

## 2018-04-18 DIAGNOSIS — E876 Hypokalemia: Secondary | ICD-10-CM

## 2018-04-18 DIAGNOSIS — I10 Essential (primary) hypertension: Secondary | ICD-10-CM | POA: Diagnosis not present

## 2018-04-18 DIAGNOSIS — Z0000 Encounter for general adult medical examination without abnormal findings: Secondary | ICD-10-CM | POA: Diagnosis not present

## 2018-04-18 DIAGNOSIS — R531 Weakness: Secondary | ICD-10-CM | POA: Diagnosis not present

## 2018-04-18 DIAGNOSIS — Z139 Encounter for screening, unspecified: Secondary | ICD-10-CM

## 2018-04-18 LAB — CBC WITH DIFFERENTIAL/PLATELET
Abs Immature Granulocytes: 0.08 10*3/uL — ABNORMAL HIGH (ref 0.00–0.07)
Basophils Absolute: 0 10*3/uL (ref 0.0–0.1)
Basophils Relative: 0 %
Eosinophils Absolute: 0.2 10*3/uL (ref 0.0–0.5)
Eosinophils Relative: 3 %
HCT: 37.8 % (ref 36.0–46.0)
Hemoglobin: 12.1 g/dL (ref 12.0–15.0)
Immature Granulocytes: 1 %
Lymphocytes Relative: 11 %
Lymphs Abs: 0.9 10*3/uL (ref 0.7–4.0)
MCH: 33.9 pg (ref 26.0–34.0)
MCHC: 32 g/dL (ref 30.0–36.0)
MCV: 105.9 fL — ABNORMAL HIGH (ref 80.0–100.0)
Monocytes Absolute: 0.7 10*3/uL (ref 0.1–1.0)
Monocytes Relative: 8 %
NEUTROS ABS: 6.3 10*3/uL (ref 1.7–7.7)
Neutrophils Relative %: 77 %
Platelets: 235 10*3/uL (ref 150–400)
RBC: 3.57 MIL/uL — ABNORMAL LOW (ref 3.87–5.11)
RDW: 14.7 % (ref 11.5–15.5)
WBC: 8.2 10*3/uL (ref 4.0–10.5)
nRBC: 0 % (ref 0.0–0.2)

## 2018-04-18 LAB — COMPREHENSIVE METABOLIC PANEL
ALT: 18 U/L (ref 0–44)
AST: 26 U/L (ref 15–41)
Albumin: 4.1 g/dL (ref 3.5–5.0)
Alkaline Phosphatase: 66 U/L (ref 38–126)
Anion gap: 12 (ref 5–15)
BUN: 29 mg/dL — ABNORMAL HIGH (ref 8–23)
CALCIUM: 8.8 mg/dL — AB (ref 8.9–10.3)
CO2: 23 mmol/L (ref 22–32)
Chloride: 105 mmol/L (ref 98–111)
Creatinine, Ser: 1.52 mg/dL — ABNORMAL HIGH (ref 0.44–1.00)
GFR calc Af Amer: 35 mL/min — ABNORMAL LOW (ref >60)
GFR calc non Af Amer: 30 mL/min — ABNORMAL LOW (ref >60)
Glucose, Bld: 92 mg/dL (ref 70–99)
Potassium: 3.4 mmol/L — ABNORMAL LOW (ref 3.5–5.1)
Sodium: 140 mmol/L (ref 135–145)
Total Bilirubin: 1 mg/dL (ref 0.3–1.2)
Total Protein: 7.5 g/dL (ref 6.5–8.1)

## 2018-04-18 LAB — URINALYSIS, ROUTINE W REFLEX MICROSCOPIC
Bilirubin Urine: NEGATIVE
Glucose, UA: NEGATIVE mg/dL
Hgb urine dipstick: NEGATIVE
Ketones, ur: NEGATIVE mg/dL
Leukocytes, UA: NEGATIVE
Nitrite: NEGATIVE
Protein, ur: NEGATIVE mg/dL
Specific Gravity, Urine: 1.011 (ref 1.005–1.030)
pH: 7 (ref 5.0–8.0)

## 2018-04-18 LAB — TROPONIN I: Troponin I: <0.03 ng/mL (ref <0.03)

## 2018-04-18 LAB — LACTIC ACID, PLASMA: Lactic Acid, Venous: 1.7 mmol/L (ref 0.5–1.9)

## 2018-04-18 MED ORDER — POTASSIUM CHLORIDE CRYS ER 20 MEQ PO TBCR
40.0000 meq | EXTENDED_RELEASE_TABLET | Freq: Once | ORAL | Status: AC
  Administered 2018-04-18: 40 meq via ORAL
  Filled 2018-04-18: qty 2

## 2018-04-18 NOTE — ED Notes (Signed)
APS report made as pt seems unable to safely care for self at home, daughter lives in [place], and pt states she has nobody to help her.

## 2018-04-18 NOTE — ED Triage Notes (Signed)
Pt has been seen for multiple complaints recently at different facilities.  Pt lives alone and has been having frequent falls and chronic complaints.  Unable to pinpoint a specific complaint, but pt has no local family and seems to be struggling socially with aging processes and self care.

## 2018-04-18 NOTE — Telephone Encounter (Signed)
Received OP page from patient stating that she "needed help". She was unable to tell me exactly what was wrong with her at the time. She stated that she lives in [place] and was interested in coming to [place] to be seen however EMS would only take her to [facility] given her residential address and that she did not want to go to that specific hospital. She was belligerent to me on the phone, including cussing and yelling at me. I calmly requested that if she needed help I would be more than happy to call EMS for her and have her transported to the nearest ED. She declined and hung the phone up.   Kathyrn Drown NP-C [place] Pager: [phone]

## 2018-04-18 NOTE — ED Notes (Signed)
Pt ambulated without difficulty

## 2018-04-18 NOTE — Discharge Instructions (Addendum)
Take your usual prescriptions as previously directed. A Home Health services referral has been placed for you today. Walk with your walker. Move slowly when changing positions.  Apply moist heat or ice to the area(s) of discomfort, for 15 minutes at a time, several times per day for the next few days.  Do not fall asleep on a heating or ice pack.  Call your regular medical doctor tomorrow to schedule a follow up appointment in the next 2 days. Return to the Emergency Department immediately if worsening.
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
## 2018-04-18 NOTE — ED Notes (Signed)
Pt taken to Waiting room to wait for ride
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
## 2018-04-19 ENCOUNTER — Other Ambulatory Visit: Payer: Self-pay

## 2018-04-19 ENCOUNTER — Emergency Department (HOSPITAL_COMMUNITY)
Admission: EM | Admit: 2018-04-19 | Discharge: 2018-04-19 | Disposition: A | Discharge status: Home / Self Care | Payer: Medicare Other | Attending: Emergency Medicine | Admitting: Emergency Medicine

## 2018-04-19 ENCOUNTER — Encounter (HOSPITAL_COMMUNITY): Payer: Self-pay | Admitting: *Deleted

## 2018-04-19 ENCOUNTER — Ambulatory Visit (INDEPENDENT_AMBULATORY_CARE_PROVIDER_SITE_OTHER): Payer: Medicare Other

## 2018-04-19 ENCOUNTER — Emergency Department (HOSPITAL_COMMUNITY): Payer: Medicare Other

## 2018-04-19 DIAGNOSIS — N183 Chronic kidney disease, stage 3 (moderate): Secondary | ICD-10-CM | POA: Insufficient documentation

## 2018-04-19 DIAGNOSIS — I5032 Chronic diastolic (congestive) heart failure: Secondary | ICD-10-CM | POA: Diagnosis not present

## 2018-04-19 DIAGNOSIS — I639 Cerebral infarction, unspecified: Secondary | ICD-10-CM | POA: Diagnosis not present

## 2018-04-19 DIAGNOSIS — F3132 Bipolar disorder, current episode depressed, moderate: Secondary | ICD-10-CM | POA: Diagnosis not present

## 2018-04-19 DIAGNOSIS — Z79899 Other long term (current) drug therapy: Secondary | ICD-10-CM | POA: Diagnosis not present

## 2018-04-19 DIAGNOSIS — F3113 Bipolar disorder, current episode manic without psychotic features, severe: Secondary | ICD-10-CM

## 2018-04-19 DIAGNOSIS — I10 Essential (primary) hypertension: Secondary | ICD-10-CM | POA: Diagnosis not present

## 2018-04-19 DIAGNOSIS — I130 Hypertensive heart and chronic kidney disease with heart failure and stage 1 through stage 4 chronic kidney disease, or unspecified chronic kidney disease: Secondary | ICD-10-CM | POA: Insufficient documentation

## 2018-04-19 DIAGNOSIS — Z7901 Long term (current) use of anticoagulants: Secondary | ICD-10-CM | POA: Diagnosis not present

## 2018-04-19 DIAGNOSIS — R1111 Vomiting without nausea: Secondary | ICD-10-CM | POA: Diagnosis not present

## 2018-04-19 DIAGNOSIS — Z950 Presence of cardiac pacemaker: Secondary | ICD-10-CM | POA: Diagnosis not present

## 2018-04-19 DIAGNOSIS — R110 Nausea: Secondary | ICD-10-CM | POA: Diagnosis not present

## 2018-04-19 DIAGNOSIS — R0902 Hypoxemia: Secondary | ICD-10-CM | POA: Diagnosis not present

## 2018-04-19 DIAGNOSIS — R51 Headache: Secondary | ICD-10-CM | POA: Diagnosis not present

## 2018-04-19 DIAGNOSIS — R112 Nausea with vomiting, unspecified: Secondary | ICD-10-CM | POA: Diagnosis not present

## 2018-04-19 DIAGNOSIS — R1110 Vomiting, unspecified: Secondary | ICD-10-CM | POA: Diagnosis present

## 2018-04-19 LAB — URINALYSIS, ROUTINE W REFLEX MICROSCOPIC
Bilirubin Urine: NEGATIVE
Glucose, UA: NEGATIVE mg/dL
Hgb urine dipstick: NEGATIVE
Ketones, ur: NEGATIVE mg/dL
Nitrite: NEGATIVE
Protein, ur: NEGATIVE mg/dL
Specific Gravity, Urine: 1.013 (ref 1.005–1.030)
pH: 7 (ref 5.0–8.0)

## 2018-04-19 LAB — RAPID URINE DRUG SCREEN, HOSP PERFORMED
Amphetamines: NOT DETECTED
Barbiturates: NOT DETECTED
Benzodiazepines: NOT DETECTED
Cocaine: NOT DETECTED
OPIATES: NOT DETECTED
Tetrahydrocannabinol: NOT DETECTED

## 2018-04-19 LAB — CBC WITH DIFFERENTIAL/PLATELET
Abs Immature Granulocytes: 0.1 10*3/uL — ABNORMAL HIGH (ref 0.00–0.07)
BASOS PCT: 1 %
Basophils Absolute: 0 10*3/uL (ref 0.0–0.1)
Eosinophils Absolute: 0.2 10*3/uL (ref 0.0–0.5)
Eosinophils Relative: 2 %
HCT: 35 % — ABNORMAL LOW (ref 36.0–46.0)
Hemoglobin: 11.4 g/dL — ABNORMAL LOW (ref 12.0–15.0)
Immature Granulocytes: 1 %
Lymphocytes Relative: 10 %
Lymphs Abs: 0.9 10*3/uL (ref 0.7–4.0)
MCH: 33.8 pg (ref 26.0–34.0)
MCHC: 32.6 g/dL (ref 30.0–36.0)
MCV: 103.9 fL — ABNORMAL HIGH (ref 80.0–100.0)
Monocytes Absolute: 0.7 10*3/uL (ref 0.1–1.0)
Monocytes Relative: 8 %
Neutro Abs: 6.7 10*3/uL (ref 1.7–7.7)
Neutrophils Relative %: 78 %
Platelets: 221 10*3/uL (ref 150–400)
RBC: 3.37 MIL/uL — ABNORMAL LOW (ref 3.87–5.11)
RDW: 15 % (ref 11.5–15.5)
WBC: 8.6 10*3/uL (ref 4.0–10.5)
nRBC: 0 % (ref 0.0–0.2)

## 2018-04-19 LAB — COMPREHENSIVE METABOLIC PANEL
ALT: 16 U/L (ref 0–44)
AST: 22 U/L (ref 15–41)
Albumin: 3.8 g/dL (ref 3.5–5.0)
Alkaline Phosphatase: 65 U/L (ref 38–126)
Anion gap: 12 (ref 5–15)
BILIRUBIN TOTAL: 0.9 mg/dL (ref 0.3–1.2)
BUN: 34 mg/dL — ABNORMAL HIGH (ref 8–23)
CO2: 22 mmol/L (ref 22–32)
Calcium: 9.4 mg/dL (ref 8.9–10.3)
Chloride: 106 mmol/L (ref 98–111)
Creatinine, Ser: 1.51 mg/dL — ABNORMAL HIGH (ref 0.44–1.00)
GFR calc Af Amer: 35 mL/min — ABNORMAL LOW (ref >60)
GFR calc non Af Amer: 30 mL/min — ABNORMAL LOW (ref >60)
Glucose, Bld: 104 mg/dL — ABNORMAL HIGH (ref 70–99)
Potassium: 3.7 mmol/L (ref 3.5–5.1)
Sodium: 140 mmol/L (ref 135–145)
Total Protein: 7.1 g/dL (ref 6.5–8.1)

## 2018-04-19 LAB — TSH: TSH: 3.615 u[IU]/mL (ref 0.350–4.500)

## 2018-04-19 MED ORDER — ONDANSETRON HCL 4 MG/2ML IJ SOLN
4.0000 mg | Freq: Once | INTRAMUSCULAR | Status: AC
  Administered 2018-04-19: 4 mg via INTRAVENOUS
  Filled 2018-04-19: qty 2

## 2018-04-19 MED ORDER — SODIUM CHLORIDE 0.9 % IV BOLUS
500.0000 mL | Freq: Once | INTRAVENOUS | Status: AC
  Administered 2018-04-19: 500 mL via INTRAVENOUS

## 2018-04-19 NOTE — ED Notes (Signed)
Kimberly [name] [phone] able to pick pt up form ED.

## 2018-04-19 NOTE — Progress Notes (Signed)
Remote pacemaker transmission.   
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
## 2018-04-19 NOTE — ED Notes (Signed)
Spoke with Barbaraann Rondo from social work regarding pt's discharge as is psychiatrically clear.

## 2018-04-19 NOTE — ED Provider Notes (Signed)
[facility] EMERGENCY DEPARTMENT Provider Note   CSN: [id number] Arrival date & time: 04/19/18  0351  Time seen 5:50 AM   History   Chief Complaint Chief Complaint  Patient presents with  . Emesis    HPI Kimberly [name] is a 84 y.o. female.  HPI patient presents via EMS.  She states she was fine all day and she vomited about 1030 this evening about once.  She states about 30 minutes prior to arrival she started getting headache and points to her left upper head.  When asked how it feels she states "everything" but then states it is throbbing.  She denies any change in her vision, she has some chronic numbness and tingling of her extremities is not worse, she denies any history of headaches.  Patient is on Eliquis.  EMS states they have been called to her house every day for at least the past month.  She was in the ED yesterday and CPS was contacted.  Patient lives home alone.  When you review her encounters she has been calling her doctor's office daily if not multiple times a day since November 9.  For that there are rare phone calls.  She even called 1 night to ask the on-call doctor to help her find her cell phone.  Patient has a history of bipolar disorder.  Patient's POA is her daughter who lives in [place] and is not involved in her care.  Patient told me she is the youngest of 41 children and the only surviving sibling.  She states she is getting ready to move to [place] because she has "lots of relatives" there.  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  She does not appear to be in any distress.  PCP Dettinger, Fransisca Kaufmann, MD   Past Medical History:  Diagnosis Date  . Acute blood loss anemia 10/11/2012  . Acute diverticulitis 08/24/2013  . Acute on chronic combined systolic and diastolic CHF, NYHA class 4 ([place]) 11/15/2013  . Antral ulcer 10/11/2012  . Arrhythmia    atrial fibb  . Atrial fibrillation ([place])   . Cardiomyopathy,  nonischemic ([place])   . Chronic anticoagulation 10/12/2012  . CKD (chronic kidney disease) stage 3, GFR 30-59 ml/min (HCC) 10/12/2012  . Depression   . Erosive esophagitis 10/11/2012  . Fibromyalgia   . Glaucoma   . H/O echocardiogram 2007   EF 40-45%,         . Hypertension   . Osteoarthritis   . Pacemaker    Last saw cards 07/2013  . Scoliosis     Patient Active Problem List   Diagnosis Date Noted  . Enteritis of small intestine due to enterotoxigenic Escherichia coli associated with diarrhea 03/24/2017  . Bipolar affective disorder ([place]) 03/23/2017  . Olfactory hallucinations 03/23/2017  . Lower abdominal pain   . Lower GI bleed   . Hematochezia 03/01/2017  . Glaucoma 02/28/2017  . Pacemaker 03/21/2015  . Non-ischemic cardiomyopathy ([place]) 11/28/2014  . PUD (peptic ulcer disease) 06/14/2014  . History of bipolar disorder   . Hypokalemia 05/26/2014  . Fall at home 05/26/2014  . Chronic diastolic heart failure ([place]) 11/15/2013  . Complete heart block ([place]) 11/15/2013  . Inability to ambulate due to ankle or foot 09/05/2013  . Unspecified constipation 08/26/2013  . Anemia 08/25/2013  . CKD (chronic kidney disease) stage 3, GFR 30-59 ml/min (HCC) 10/12/2012  . Chronic anticoagulation 10/12/2012  . Osteoarthritis   .  Permanent atrial fibrillation   . Biventricular cardiac pacemaker - [employer] Consulta   . Fibromyalgia   . Essential hypertension   . Depression     Past Surgical History:  Procedure Laterality Date  . ABDOMINAL HYSTERECTOMY    . APPENDECTOMY    . BACK SURGERY    . BIOPSY  03/03/2017   Procedure: BIOPSY;  Surgeon: Danie Binder, MD;  Location: [facility] ENDO SUITE;  Service: Endoscopy;;  gastric  . BREAST SURGERY    . CARDIAC CATHETERIZATION  12/08/2005   LAD AND LEFT MAIN WITH NO HIGH-GRADE STENOSIS. MILD DISEASE IN THE CX AND LAD SYSTEM. SEVERE LV DYSFUNCTION WITH DILATION OF THE LV. EF 15-20%. LV END-DIASTOLIC PRESSURE IS 90. +1 MR.  . CHOLECYSTECTOMY    .  COLONOSCOPY N/A 03/03/2017   Procedure: COLONOSCOPY;  Surgeon: Danie Binder, MD;  Location: [facility] ENDO SUITE;  Service: Endoscopy;  Laterality: N/A;  . CYSTOSCOPY N/A 02/24/2013   Procedure: CYSTOSCOPY WITH URETHRAL DILITATION;  Surgeon: Marissa Nestle, MD;  Location: [facility] ORS;  Service: Urology;  Laterality: N/A;  . DOPPLER ECHOCARDIOGRAPHY N/A 05/30/2010   LV SIZE IS NORMAL. LV SYSTOLIC FUNCTION IS LOW NORMAL. EF=50-55%. MILD INFERIOR HYPOKINESIS.MILD TO MODERATE POSTERIOR WALL HYPOKINESIS.PACEMAKER LEAD IN THE RV. LA IS MILDLY DILATED. RA IS MODERATE TO SEVERLY DILATED. PACEMAKER LEAD IN THE RA. MILD CALCICICATION OF THE MV APPARATUS. MODERATE MR. MILD TO MODERATE TR. MILD PHTN.AV MILDLY SCLEROTIC.  [name] ESOPHAGOGASTRODUODENOSCOPY N/A 10/13/2012   Dr. Gala Romney: severe ulcerative reflux esophagitis, question of Barrett's but negative path, single deep prepyloric antral ulcer, negative H.pylori  . ESOPHAGOGASTRODUODENOSCOPY N/A 03/03/2017   Procedure: ESOPHAGOGASTRODUODENOSCOPY (EGD);  Surgeon: Danie Binder, MD;  Location: [facility];  Service: Endoscopy;  Laterality: N/A;  . HERNIA REPAIR     right inguinal hernia and umbilical  . LOWETR EXT VENOUS Bilateral 11-08-10   R & L- NO EVIDENCE OF THROMBUS OR THROMBOPHLEBITIS. THERE IS MILD AMOUNT OF SUBCUTANEOUS EDEMA NOTED WITHIN THE LEFT CALF AND ANKLE. R & L GSV AND SSV- NO VENOUS INSUFF NOTED.  [name] NECK SURGERY    . NUCLEAR STRESS TEST N/A 02/13/2009   NORMAL PATTERN OF PERFUSION IN ALL REGIONS. POST STRESS VENTICULAR SIZE IS NORMAL. POST  STESS EF 85%.  NORMAL MYOCARDIAL PERFUSION STUDY.  [name] PACEMAKER INSERTION    . POLYPECTOMY  03/03/2017   Procedure: POLYPECTOMY;  Surgeon: Danie Binder, MD;  Location: [facility];  Service: Endoscopy;;  colon  . TONSILLECTOMY    . YAG LASER APPLICATION Bilateral 6/76/1948   Procedure: YAG LASER APPLICATION;  Surgeon: Williams Che, MD;  Location: [facility] ORS;  Service: Ophthalmology;  Laterality: Bilateral;      OB History    Gravida  5   Para  2   Term  1   Preterm  1   AB  3   Living  2     SAB  3   TAB      Ectopic      Multiple      Live Births               Home Medications    Prior to Admission medications   Medication Sig Start Date End Date Taking? Authorizing Provider  carvedilol (COREG) 6.25 MG tablet TAKE 1 TABLET BY MOUTH 2  TIMES DAILY WITH A MEAL 03/29/18   Croitoru, Mihai, MD  ELIQUIS 2.5 MG TABS tablet TAKE 1 TABLET BY MOUTH  TWICE A DAY 03/29/18  Croitoru, Mihai, MD  furosemide (LASIX) 20 MG tablet TAKE 1 TABLET BY MOUTH  DAILY 03/29/18   Croitoru, Mihai, MD  mirtazapine (REMERON) 30 MG tablet Take 1 tablet (30 mg total) by mouth at bedtime. 03/29/18   Dettinger, Fransisca Kaufmann, MD  Multiple Vitamins-Minerals (PRESERVISION AREDS 2) CAPS Take 1 capsule daily by mouth.    [provider]  PARoxetine (PAXIL) 20 MG tablet  04/09/18   [provider]  Polyethyl Glycol-Propyl Glycol (SYSTANE ULTRA) 0.4-0.3 % SOLN Place 1-2 drops 2 (two) times daily as needed into both eyes (dry eyes).     [provider]  Polyethylene Glycol 400 (BLINK TEARS) 0.25 % SOLN Apply 2 drops to eye every 6 (six) hours.    [provider]  potassium chloride SA (K-DUR,KLOR-CON) 20 MEQ tablet Take 1 tablet (20 mEq total) by mouth daily. Patient not taking: Reported on 04/18/2018 04/05/18   Julianne Rice, MD  sodium chloride (OCEAN) 0.65 % SOLN nasal spray Place 1 spray 2 (two) times daily as needed into both nostrils for congestion.     [provider]    Family History Family History  Problem Relation Age of Onset  . Cancer Sister   . Asthma Sister   . Heart failure Brother   . Pulmonary embolism Brother   . Cancer Brother   . Colon cancer Neg Hx     Social History Social History   Tobacco Use  . Smoking status: Former Smoker    Packs/day: 0.50    Years: 30.00    Pack years: 15.00    Types: Cigarettes    Last attempt to  quit: 12/13/2004    Years since quitting: 13.3  . Smokeless tobacco: Never Used  . Tobacco comment: former smoker  Substance Use Topics  . Alcohol use: Yes    Alcohol/week: 0.0 standard drinks    Comment: history of drinking a 1/2 glass of wine in the evening  . Drug use: No  Lives at home Lives alone   Allergies   Ciprofloxacin; Flagyl [metronidazole]; Penicillins; and Sulfa antibiotics   Review of Systems Review of Systems  All other systems reviewed and are negative.    Physical Exam Updated Vital Signs BP (!) 160/78   Pulse 73   Temp 98.6 F (37 C) (Oral)   Resp 14   Ht 5\' 6"  (1.676 m)   Wt 64 kg   SpO2 100%   BMI 22.77 kg/m  Vital signs normal except hypertension   Physical Exam Vitals signs and nursing note reviewed.  Constitutional:      General: She is not in acute distress.    Appearance: Normal appearance. She is well-developed. She is not ill-appearing or toxic-appearing.     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  She has a bunch of portraits from her billfold that she has spread out over her lap that she is looking at.  HENT:     Head: Normocephalic and atraumatic.     Right Ear: External ear normal.     Left Ear: External ear normal.     Nose: Nose normal. No mucosal edema or rhinorrhea.     Mouth/Throat:     Dentition: No dental abscesses.     Pharynx: No uvula swelling.  Eyes:     Conjunctiva/sclera: Conjunctivae normal.     Pupils: Pupils are equal, round, and reactive to  light.  Neck:     Musculoskeletal: Full passive range of motion without pain, normal range of motion and neck supple.  Cardiovascular:     Rate and Rhythm: Normal rate and regular rhythm.     Heart sounds: Normal heart sounds. No murmur. No friction rub. No gallop.   Pulmonary:     Effort: Pulmonary effort is normal. No respiratory distress.     Breath sounds: Normal  breath sounds. No wheezing, rhonchi or rales.  Chest:     Chest wall: No tenderness or crepitus.  Abdominal:     General: Bowel sounds are normal. There is no distension.     Palpations: Abdomen is soft.     Tenderness: There is no abdominal tenderness. There is no guarding or rebound.  Musculoskeletal: Normal range of motion.        General: No tenderness.     Comments: Moves all extremities well.   Skin:    General: Skin is warm and dry.     Coloration: Skin is not pale.     Findings: No erythema or rash.  Neurological:     Mental Status: She is alert and oriented to person, place, and time.     Cranial Nerves: No cranial nerve deficit.     Comments: Grips are equal, there is no focal motor weakness.  Psychiatric:        Mood and Affect: Mood is anxious.        Speech: Speech is rapid and pressured.        Behavior: Behavior normal. Behavior is cooperative.      ED Treatments / Results  Labs (all labs ordered are listed, but only abnormal results are displayed) Results for orders placed or performed during the hospital encounter of 04/19/18  Urinalysis, Routine w reflex microscopic  Result Value Ref Range   Color, Urine YELLOW YELLOW   APPearance CLEAR CLEAR   Specific Gravity, Urine 1.013 1.005 - 1.030   pH 7.0 5.0 - 8.0   Glucose, UA NEGATIVE NEGATIVE mg/dL   Hgb urine dipstick NEGATIVE NEGATIVE   Bilirubin Urine NEGATIVE NEGATIVE   Ketones, ur NEGATIVE NEGATIVE mg/dL   Protein, ur NEGATIVE NEGATIVE mg/dL   Nitrite NEGATIVE NEGATIVE   Leukocytes, UA TRACE (A) NEGATIVE   RBC / HPF 0-5 0 - 5 RBC/hpf   WBC, UA 6-10 0 - 5 WBC/hpf   Bacteria, UA RARE (A) NONE SEEN   Squamous Epithelial / LPF 0-5 0 - 5  Urine rapid drug screen (hosp performed)  Result Value Ref Range   Opiates NONE DETECTED NONE DETECTED   Cocaine NONE DETECTED NONE DETECTED   Benzodiazepines NONE DETECTED NONE DETECTED   Amphetamines NONE DETECTED NONE DETECTED   Tetrahydrocannabinol NONE DETECTED  NONE DETECTED   Barbiturates NONE DETECTED NONE DETECTED  Comprehensive metabolic panel  Result Value Ref Range   Sodium 140 135 - 145 mmol/L   Potassium 3.7 3.5 - 5.1 mmol/L   Chloride 106 98 - 111 mmol/L   CO2 22 22 - 32 mmol/L   Glucose, Bld 104 (H) 70 - 99 mg/dL   BUN 34 (H) 8 - 23 mg/dL   Creatinine, Ser 1.51 (H) 0.44 - 1.00 mg/dL   Calcium 9.4 8.9 - 10.3 mg/dL   Total Protein 7.1 6.5 - 8.1 g/dL   Albumin 3.8 3.5 - 5.0 g/dL   AST 22 15 - 41 U/L   ALT 16 0 - 44 U/L   Alkaline Phosphatase 65 38 - 126  U/L   Total Bilirubin 0.9 0.3 - 1.2 mg/dL   GFR calc non Af Amer 30 (L) >60 mL/min   GFR calc Af Amer 35 (L) >60 mL/min   Anion gap 12 5 - 15  CBC with Differential  Result Value Ref Range   WBC 8.6 4.0 - 10.5 K/uL   RBC 3.37 (L) 3.87 - 5.11 MIL/uL   Hemoglobin 11.4 (L) 12.0 - 15.0 g/dL   HCT 35.0 (L) 36.0 - 46.0 %   MCV 103.9 (H) 80.0 - 100.0 fL   MCH 33.8 26.0 - 34.0 pg   MCHC 32.6 30.0 - 36.0 g/dL   RDW 15.0 11.5 - 15.5 %   Platelets 221 150 - 400 K/uL   nRBC 0.0 0.0 - 0.2 %   Neutrophils Relative % 78 %   Neutro Abs 6.7 1.7 - 7.7 K/uL   Lymphocytes Relative 10 %   Lymphs Abs 0.9 0.7 - 4.0 K/uL   Monocytes Relative 8 %   Monocytes Absolute 0.7 0.1 - 1.0 K/uL   Eosinophils Relative 2 %   Eosinophils Absolute 0.2 0.0 - 0.5 K/uL   Basophils Relative 1 %   Basophils Absolute 0.0 0.0 - 0.1 K/uL   Immature Granulocytes 1 %   Abs Immature Granulocytes 0.10 (H) 0.00 - 0.07 K/uL  TSH  Result Value Ref Range   TSH 3.615 0.350 - 4.500 uIU/mL   Laboratory interpretation all normal except stable renal insufficiency, mild anemia   EKG EKG Interpretation  Date/Time:  Monday April 19 2018 05:33:30 EST Ventricular Rate:  70 PR Interval:    QRS Duration: 126 QT Interval:  411 QTC Calculation: 444 R Axis:   0 Text Interpretation:  Afib/flutter and ventricular-paced rhythm No further analysis attempted due to paced rhythm No significant change since last tracing 18 Apr 2018 Confirmed by Rolland Porter [phone]) on 04/19/2018 5:51:00 AM    Radiology  Ct Abdomen Pelvis Wo Contrast  Result Date: 04/04/2018 CLINICAL DATA:  Chronic right lower abdominal pain. [name] IMPRESSION: No acute abnormality abdomen or pelvis. No finding to explain the patient's symptoms. Extensive calcific aortic and coronary atherosclerosis. Diverticulosis without diverticulitis. 2-3 punctate nonobstructing stones right kidney. Small to moderate hiatal hernia. Severe convex right thoracolumbar scoliosis and multilevel degenerative disease. Electronically Signed   By: Inge Rise M.D.   On: 04/04/2018 16:56   Dg Chest 2 View  Result Date: 04/18/2018 CLINICAL DATA:  Frequent falls.  Bruising. [name] IMPRESSION: No acute infiltrate or change. Electronically Signed   By: Dorise Bullion III M.D   On: 04/18/2018 14:48   Dg Chest 2 View  Result Date: 04/04/2018 CLINICAL DATA:  Chronic shortness of breath which worsened this morning.  IMPRESSION: No acute disease. COPD. Atherosclerosis. Electronically Signed   By: Inge Rise M.D.   On: 04/04/2018 12:53   Dg Ankle Complete Left  Result Date: 04/18/2018 CLINICAL DATA:  Frequent falls.  Bruising and swelling. MPRESSION: Soft tissue swelling without identified fracture.  Osteopenia. Electronically Signed   By: Dorise Bullion III M.D   On: 04/18/2018 14:49   Ct Head Wo Contrast  Ct Cervical Spine Wo Contrast  Result Date: 04/18/2018 CLINICAL DATA:  Multiple recent falls.  Initial encounter.  IMPRESSION: No acute abnormality head or cervical spine. Electronically Signed   By: Inge Rise M.D.   On: 04/18/2018 15:32   Ct Head Wo Contrast  Result Date: 04/04/2018 CLINICAL DATA:  Minor head trauma.  On anticoagulant.IMPRESSION: 1. No acute intracranial abnormalities.  2. Chronic small vessel ischemic disease. Electronically Signed   By: Kerby Moors M.D.   On: 04/04/2018 14:48          Procedures Procedures (including critical care  time)  Medications Ordered in ED Medications  sodium chloride 0.9 % bolus 500 mL (0 mLs Intravenous Stopped 04/19/18 0647)  ondansetron (ZOFRAN) injection 4 mg (4 mg Intravenous Given 04/19/18 0522)     Initial Impression / Assessment and Plan / ED Course  I have reviewed the triage vital signs and the nursing notes.  Pertinent labs & imaging results that were available during my care of the patient were reviewed by me and considered in my medical decision making (see chart for details).     I was going to do a head CT however patient has already had 2 head CTs this month.  She did not have any trauma.  Patient does not appear to be in any distress or in pain or have any focal neurological deficits so the CT was canceled.  After talking with nursing staff and looking at her notes  6:30 AM patient's tests have resulted, TTS consult was ordered.  07:40 AM Patient is having her TTS consult now.   08:20 Dr Reather Converse will wait for TTS consult results and do her disposition.   Final Clinical Impressions(s) / ED Diagnoses   Final diagnoses:  Bipolar disorder, current episode manic without psychotic features, severe ([place])    Disposition pending  Rolland Porter, MD, Barbette Or, MD 04/19/18 [phone]
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
## 2018-04-19 NOTE — Clinical Social Work Note (Signed)
Met with patient per nursing request.  Patient complaining of feeling bad, "like throwing up bad."  However, patient exhibited no signs nor symptoms of illness during the half hour we were conversing.  She states she uses a walker and has a bedside commode, that she orders food delivered to her house that she cooks, and uses either [facility] or a neighbor for transportation when needed.  Her response to the question of increased ED visits and calls to the Dr's office was that she alerted her "medical guardian," while showing me what appeared to be a Fitbit on her wrist.  She stated she called them when she got in the jaccuzi to let them know she was getting in and would be out in an hour.  Of course emergency showed up at her home.  Pt is adamant that she needs no other supports in the home, that she is not interested in living in a retirement center, and that she is planning on getting out of [place] and moving to [place] in the next several months.  With permission from patient, called daughter who states that patient will not allow her to intervene on her behalf in terms of any services nor help, and that she believes the patient has "mania," which explains current situation.  Pt denied ever being diagnosed with Bipolar D/O, nor being prescribed any medications.  While patient does present with pressured speech and is somewhat expansive, she was redirectable and was not making grandiose nor delusional statements. Pt declined any services or referrals from [facility].  Spoke with nursing staff about sending her home via cab.  Patient stated she would pay.

## 2018-04-19 NOTE — ED Notes (Signed)
Pt had wet bed. Linen changed. Pt placed in burgandy scrubs for psych hold.

## 2018-04-19 NOTE — Telephone Encounter (Signed)
Thanks for the information, I do see that she went to the emergency department, we will monitor

## 2018-04-19 NOTE — ED Notes (Signed)
TTS completed. 

## 2018-04-19 NOTE — Discharge Instructions (Addendum)
If you were given medicines take as directed.  If you are on coumadin or contraceptives realize their levels and effectiveness is altered by many different medicines.  If you have any reaction (rash, tongues swelling, other) to the medicines stop taking and see a physician.    If your blood pressure was elevated in the ER make sure you follow up for management with a primary doctor or return for chest pain, shortness of breath or stroke symptoms.  Please follow up as directed and return to the ER or see a physician for new or worsening symptoms.  Thank you. Vitals:   04/19/18 0700 04/19/18 0800 04/19/18 0900 04/19/18 1000  BP: (!) 177/71 (!) 147/71 131/73 (!) 144/68  Pulse:  70 72 70  Resp: 17 (!) 27 (!) 21 19  Temp:      TempSrc:      SpO2:  96% 99% 96%  Weight:      Height:

## 2018-04-19 NOTE — Progress Notes (Signed)
Patient is seen and chart is reviewed. The patient does not meet criteria for inpatient gero-psych placement. She denies any suicidal/homicidal ideation and there are no evident signs of acute psychosis. Patient states "I was throwing up yesterday. I was sick. I have been stable on paxil for some time with no mood symptoms. I just want to move back home to [place]. I'm doing better today." She was observed eating breakfast in no acute distress. Case reviewed with TTS staff and is at this time psychiatrically cleared.   Elmarie Shiley PMHNP-C 04/19/2018 09 am

## 2018-04-19 NOTE — Telephone Encounter (Signed)
Remote transmission from 12/13 reviewed. Presenting rhythm: BiVp @ 80bpm. (2) VT-NS episodes, max dur. 8bts, last 08/27/17. (8) "VS" episodes recorded, last 10/05/17 - no EGMs. 100%Bp, 1.7%VSRp. Stable thoracic impedance at present. Stable lead measurements. Normal device function.  LMTCB/sss  Stable device transmission

## 2018-04-19 NOTE — ED Notes (Signed)
Spoke with APS worker who states they will be out to pt's home within 72 hours to evaluate patient.

## 2018-04-19 NOTE — ED Notes (Signed)
Pt has been very difficult and demanding since beginning of shift.  Pt is throwing food at the food and is constantly on the call bell, even after attempting to meet her needs.  Very argumentive.

## 2018-04-19 NOTE — ED Triage Notes (Signed)
Pt c/o n/v, headache that started tonight, pt lives by herself and has had multiple visits to the er over the past few weeks with numerous calls to ems as well, APS report was made yesterday,

## 2018-04-19 NOTE — ED Notes (Signed)
TTS in progress 

## 2018-04-19 NOTE — ED Provider Notes (Signed)
Patient's care signed out to me to follow-up behavioral health recommendations.  Behavioral health assessed in detail and patient does not meet inpatient criteria for [facility] psych or acute psychiatry treatment.  They recommended outpatient follow-up.  Patient is medically clear at this time.  Vital signs unremarkable except for mild elevated blood pressure.  Blood work reassuring.  Patient to follow-up close with primary doctor.  Social worker assessed and they will help her outpatient into home assessment.  Golda Acre, MD 04/19/18 [phone]

## 2018-04-19 NOTE — ED Notes (Signed)
Per pt's records, pt has had numerous calls to her pcp, one night calling to have the emergency physician return a call to her so that she can find her cell phone, several instances of cursing at staff and hanging up on the phone when pt becomes mad.

## 2018-04-20 ENCOUNTER — Encounter: Payer: Self-pay | Admitting: Cardiology

## 2018-04-20 ENCOUNTER — Encounter: Payer: Self-pay | Admitting: Family Medicine

## 2018-04-20 LAB — URINE CULTURE: Culture: <10000 — AB

## 2018-04-20 NOTE — Telephone Encounter (Signed)
Called pt and let her know per Device clinic her device is working normally, lead measurements are stable. Confirmed next remote check 07/2018. Pt states she is trying to move back home to [place] and may not be here in March 2020. I advised to call the office and let us know so we can cancel the appt so she does not have NOS fee. Pt thanked me for the call.

## 2018-04-21 ENCOUNTER — Telehealth: Payer: Self-pay | Admitting: Family Medicine

## 2018-04-21 DIAGNOSIS — K5909 Other constipation: Secondary | ICD-10-CM | POA: Diagnosis not present

## 2018-04-21 DIAGNOSIS — R05 Cough: Secondary | ICD-10-CM | POA: Diagnosis not present

## 2018-04-21 NOTE — Telephone Encounter (Signed)
Can we give her a call again and have her come in for a visit to discuss the emergency fold line policy and also for ER follow-up

## 2018-04-21 NOTE — Telephone Encounter (Signed)
**  [facility] After Hours/ Emergency Line Call**  Patient: Kimberly [name] .  PCP: Dettinger, Fransisca Kaufmann, MD  Endorsing acid reflux symptoms.  She reports she took all of her Tums.  Recommended that take the Prilosec she has in her cabinet or drink milk to sooth acid reflux.  Red flags discussed.  Will forward to PCP.  Ashly M. Gottschalk, DO 04/20/18; 11:55 pm

## 2018-04-22 ENCOUNTER — Other Ambulatory Visit: Payer: Self-pay
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
## 2018-04-23 ENCOUNTER — Other Ambulatory Visit: Payer: Self-pay

## 2018-04-23 ENCOUNTER — Telehealth: Payer: Self-pay | Admitting: Cardiovascular Disease

## 2018-04-23 DIAGNOSIS — I700 Atherosclerosis of aorta: Secondary | ICD-10-CM | POA: Diagnosis not present

## 2018-04-23 DIAGNOSIS — I4891 Unspecified atrial fibrillation: Secondary | ICD-10-CM | POA: Diagnosis not present

## 2018-04-23 DIAGNOSIS — Z79899 Other long term (current) drug therapy: Secondary | ICD-10-CM | POA: Diagnosis not present

## 2018-04-23 DIAGNOSIS — Z880 Allergy status to penicillin: Secondary | ICD-10-CM | POA: Diagnosis not present

## 2018-04-23 DIAGNOSIS — Z882 Allergy status to sulfonamides status: Secondary | ICD-10-CM | POA: Diagnosis not present

## 2018-04-23 DIAGNOSIS — R0602 Shortness of breath: Secondary | ICD-10-CM | POA: Diagnosis not present

## 2018-04-23 DIAGNOSIS — Z888 Allergy status to other drugs, medicaments and biological substances status: Secondary | ICD-10-CM | POA: Diagnosis not present

## 2018-04-23 DIAGNOSIS — I10 Essential (primary) hypertension: Secondary | ICD-10-CM | POA: Diagnosis not present

## 2018-04-23 DIAGNOSIS — Z7901 Long term (current) use of anticoagulants: Secondary | ICD-10-CM | POA: Diagnosis not present

## 2018-04-23 DIAGNOSIS — Z87891 Personal history of nicotine dependence: Secondary | ICD-10-CM | POA: Diagnosis not present

## 2018-04-23 DIAGNOSIS — J069 Acute upper respiratory infection, unspecified: Secondary | ICD-10-CM | POA: Diagnosis not present

## 2018-04-23 DIAGNOSIS — R52 Pain, unspecified: Secondary | ICD-10-CM | POA: Diagnosis not present

## 2018-04-23 DIAGNOSIS — G44019 Episodic cluster headache, not intractable: Secondary | ICD-10-CM | POA: Diagnosis not present

## 2018-04-23 DIAGNOSIS — R609 Edema, unspecified: Secondary | ICD-10-CM | POA: Diagnosis not present

## 2018-04-23 DIAGNOSIS — M797 Fibromyalgia: Secondary | ICD-10-CM | POA: Diagnosis not present

## 2018-04-23 DIAGNOSIS — Z91041 Radiographic dye allergy status: Secondary | ICD-10-CM | POA: Diagnosis not present

## 2018-04-23 NOTE — Telephone Encounter (Signed)
Spoke with pt who states she fell on 12/7 and has had some swelling but today she has notice its worst in her feet and ankle. She states the left is worse than the right and she cant put on her shoes. Pt reports her SOB has also has increased over the week then line disconnected. Attempted to call pt back and left message stating I will route message to Dr. Loletha Grayer and call her back with recommendation.
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
## 2018-04-23 NOTE — Telephone Encounter (Signed)
New Message        Pt c/o swelling: STAT is pt has developed SOB within 24 hours  1) How much weight have you gained and in what time span?   2) If swelling, where is the swelling located? Ankles are swollen double  3) Are you currently taking a fluid pill? Yes  4) Are you currently SOB? Yes  5) Do you have a log of your daily weights (if so, list)?   6) Have you gained 3 pounds in a day or 5 pounds in a week? No  7) Have you traveled recently? No

## 2018-04-24 ENCOUNTER — Other Ambulatory Visit: Payer: Self-pay | Admitting: Cardiology

## 2018-04-24 ENCOUNTER — Telehealth: Payer: Self-pay | Admitting: Cardiology

## 2018-04-24 MED ORDER — POTASSIUM CHLORIDE CRYS ER 20 MEQ PO TBCR: 20.0000 meq | EXTENDED_RELEASE_TABLET | Freq: Every day | ORAL | 2 refills | Status: DC

## 2018-04-24 NOTE — Telephone Encounter (Signed)
Pt called for lower ext edema,  I asked her to increase lasix an extra 20 mg daily for 3 days then back to one per day of 20 mg and I refilled her Kdur.  To take with the lasix if not improved to call or go to ER.  Also complains of Lt ankle pain from fall no fracture 04/18/18 in ER - instructed to go to urgent care or ER if continued pain.

## 2018-04-25 ENCOUNTER — Encounter (HOSPITAL_COMMUNITY): Payer: Self-pay | Admitting: *Deleted

## 2018-04-25 ENCOUNTER — Emergency Department (HOSPITAL_COMMUNITY): Payer: Medicare Other

## 2018-04-25 ENCOUNTER — Emergency Department (HOSPITAL_COMMUNITY)
Admission: EM | Admit: 2018-04-25 | Discharge: 2018-04-26 | Disposition: A | Discharge status: Home / Self Care | Payer: Medicare Other | Attending: Emergency Medicine | Admitting: Emergency Medicine

## 2018-04-25 ENCOUNTER — Other Ambulatory Visit: Payer: Self-pay

## 2018-04-25 DIAGNOSIS — Z87891 Personal history of nicotine dependence: Secondary | ICD-10-CM | POA: Insufficient documentation

## 2018-04-25 DIAGNOSIS — Z79899 Other long term (current) drug therapy: Secondary | ICD-10-CM | POA: Diagnosis not present

## 2018-04-25 DIAGNOSIS — I5042 Chronic combined systolic (congestive) and diastolic (congestive) heart failure: Secondary | ICD-10-CM | POA: Diagnosis not present

## 2018-04-25 DIAGNOSIS — N183 Chronic kidney disease, stage 3 (moderate): Secondary | ICD-10-CM | POA: Diagnosis not present

## 2018-04-25 DIAGNOSIS — I130 Hypertensive heart and chronic kidney disease with heart failure and stage 1 through stage 4 chronic kidney disease, or unspecified chronic kidney disease: Secondary | ICD-10-CM | POA: Insufficient documentation

## 2018-04-25 DIAGNOSIS — J189 Pneumonia, unspecified organism: Secondary | ICD-10-CM | POA: Diagnosis not present

## 2018-04-25 DIAGNOSIS — Z7901 Long term (current) use of anticoagulants: Secondary | ICD-10-CM | POA: Diagnosis not present

## 2018-04-25 DIAGNOSIS — R0602 Shortness of breath: Secondary | ICD-10-CM | POA: Diagnosis not present

## 2018-04-25 LAB — BASIC METABOLIC PANEL
Anion gap: 14 (ref 5–15)
BUN: 20 mg/dL (ref 8–23)
CALCIUM: 8.5 mg/dL — AB (ref 8.9–10.3)
CO2: 20 mmol/L — ABNORMAL LOW (ref 22–32)
Chloride: 105 mmol/L (ref 98–111)
Creatinine, Ser: 1.6 mg/dL — ABNORMAL HIGH (ref 0.44–1.00)
GFR calc Af Amer: 33 mL/min — ABNORMAL LOW (ref >60)
GFR calc non Af Amer: 28 mL/min — ABNORMAL LOW (ref >60)
Glucose, Bld: 107 mg/dL — ABNORMAL HIGH (ref 70–99)
Potassium: 3.5 mmol/L (ref 3.5–5.1)
Sodium: 139 mmol/L (ref 135–145)

## 2018-04-25 LAB — I-STAT TROPONIN, ED: TROPONIN I, POC: 0 ng/mL (ref 0.00–0.08)

## 2018-04-25 LAB — CBC WITH DIFFERENTIAL/PLATELET
Abs Immature Granulocytes: 0.11 10*3/uL — ABNORMAL HIGH (ref 0.00–0.07)
Basophils Absolute: 0 10*3/uL (ref 0.0–0.1)
Basophils Relative: 0 %
Eosinophils Absolute: 0.2 10*3/uL (ref 0.0–0.5)
Eosinophils Relative: 4 %
HCT: 33 % — ABNORMAL LOW (ref 36.0–46.0)
Hemoglobin: 10.7 g/dL — ABNORMAL LOW (ref 12.0–15.0)
Immature Granulocytes: 2 %
Lymphocytes Relative: 11 %
Lymphs Abs: 0.6 10*3/uL — ABNORMAL LOW (ref 0.7–4.0)
MCH: 34.1 pg — ABNORMAL HIGH (ref 26.0–34.0)
MCHC: 32.4 g/dL (ref 30.0–36.0)
MCV: 105.1 fL — ABNORMAL HIGH (ref 80.0–100.0)
Monocytes Absolute: 0.4 10*3/uL (ref 0.1–1.0)
Monocytes Relative: 7 %
Neutro Abs: 4.4 10*3/uL (ref 1.7–7.7)
Neutrophils Relative %: 76 %
Platelets: 259 10*3/uL (ref 150–400)
RBC: 3.14 MIL/uL — AB (ref 3.87–5.11)
RDW: 14.5 % (ref 11.5–15.5)
WBC: 5.8 10*3/uL (ref 4.0–10.5)
nRBC: 0 % (ref 0.0–0.2)

## 2018-04-25 LAB — BRAIN NATRIURETIC PEPTIDE: B Natriuretic Peptide: 270 pg/mL — ABNORMAL HIGH (ref 0.0–100.0)

## 2018-04-25 MED ORDER — MORPHINE SULFATE (PF) 4 MG/ML IV SOLN
4.0000 mg | Freq: Once | INTRAVENOUS | Status: AC
  Administered 2018-04-25: 4 mg via INTRAVENOUS
  Filled 2018-04-25: qty 1

## 2018-04-25 MED ORDER — DOXYCYCLINE HYCLATE 100 MG PO CAPS: 100.0000 mg | ORAL_CAPSULE | Freq: Two times a day (BID) | ORAL | 0 refills | Status: DC

## 2018-04-25 MED ORDER — FUROSEMIDE 10 MG/ML IJ SOLN
40.0000 mg | Freq: Once | INTRAMUSCULAR | Status: AC
  Administered 2018-04-25: 40 mg via INTRAVENOUS
  Filled 2018-04-25: qty 4

## 2018-04-25 MED ORDER — DOXYCYCLINE HYCLATE 100 MG PO TABS
100.0000 mg | ORAL_TABLET | Freq: Once | ORAL | Status: AC
  Administered 2018-04-25: 100 mg via ORAL
  Filled 2018-04-25: qty 1

## 2018-04-25 NOTE — ED Triage Notes (Addendum)
Pt from home c/o BLE edema and sob. Reports edema has been going on for several days. Three days pt doubled her dose of lasix then yesterday she tripled her dose; denies increased urination. Denies chest pain at present.   Bruising noted to right lower leg, L lower leg and hands from reported fall on 04/10/18

## 2018-04-25 NOTE — ED Provider Notes (Signed)
[facility] EMERGENCY DEPARTMENT Provider Note   CSN: [id number] Arrival date & time: 04/25/18  5277     History   Chief Complaint No chief complaint on file.   HPI Kimberly [name] is a 84 y.o. female.  The history is provided by the patient and medical records. No language interpreter was used.  Shortness of Breath      84 year old female with history of fibromyalgia, A. fib on anticoagulant, osteoarthritis, bipolar presenting complaining of shortness of breath.  Patient report she is here because she is having increased swelling to her legs as well as having shortness of breath and also complaining of pain throughout her entire body.  She is a poor historian, states the symptom has been an ongoing issue but she is more concerned about increased swelling.  She did mention having to "triple up" on her fluid pill due to the swelling and shortness of breath without adequate relief.  She endorsed occasional nonproductive cough that is chronic in nature.  She does not complain of any URI symptoms, no complaint of chest pain or abdominal pain but states "I am hurting everywhere" patient was recently seen on 12/15 in the ED for fall.  She injured her left ankle at that time.  X-ray shows no evidence of acute fracture.  Noted soft tissue swelling.  She continues to endorse swelling to her lower extremity.  Endorse shortness of breath that is not new.  Patient lives at home by herself.  Past Medical History:  Diagnosis Date  . Acute blood loss anemia 10/11/2012  . Acute diverticulitis 08/24/2013  . Acute on chronic combined systolic and diastolic CHF, NYHA class 4 ([place]) 11/15/2013  . Antral ulcer 10/11/2012  . Arrhythmia    atrial fibb  . Atrial fibrillation ([place])   . Cardiomyopathy, nonischemic ([place])   . Chronic anticoagulation 10/12/2012  . CKD (chronic kidney disease) stage 3, GFR 30-59 ml/min (HCC) 10/12/2012  . Depression   . Erosive esophagitis 10/11/2012  . Fibromyalgia     . Glaucoma   . H/O echocardiogram 2007   EF 40-45%,         . Hypertension   . Osteoarthritis   . Pacemaker    Last saw cards 07/2013  . Scoliosis     Patient Active Problem List   Diagnosis Date Noted  . Enteritis of small intestine due to enterotoxigenic Escherichia coli associated with diarrhea 03/24/2017  . Bipolar affective disorder ([place]) 03/23/2017  . Olfactory hallucinations 03/23/2017  . Lower abdominal pain   . Lower GI bleed   . Hematochezia 03/01/2017  . Glaucoma 02/28/2017  . Pacemaker 03/21/2015  . Non-ischemic cardiomyopathy ([place]) 11/28/2014  . PUD (peptic ulcer disease) 06/14/2014  . History of bipolar disorder   . Hypokalemia 05/26/2014  . Fall at home 05/26/2014  . Chronic diastolic heart failure ([place]) 11/15/2013  . Complete heart block ([place]) 11/15/2013  . Inability to ambulate due to ankle or foot 09/05/2013  . Unspecified constipation 08/26/2013  . Anemia 08/25/2013  . CKD (chronic kidney disease) stage 3, GFR 30-59 ml/min (HCC) 10/12/2012  . Chronic anticoagulation 10/12/2012  . Osteoarthritis   . Permanent atrial fibrillation   . Biventricular cardiac pacemaker - [employer] Consulta   . Fibromyalgia   . Essential hypertension   . Depression     Past Surgical History:  Procedure Laterality Date  . ABDOMINAL HYSTERECTOMY    . APPENDECTOMY    . BACK SURGERY    . BIOPSY  03/03/2017  Procedure: BIOPSY;  Surgeon: Danie Binder, MD;  Location: [facility] ENDO SUITE;  Service: Endoscopy;;  gastric  . BREAST SURGERY    . CARDIAC CATHETERIZATION  12/08/2005   LAD AND LEFT MAIN WITH NO HIGH-GRADE STENOSIS. MILD DISEASE IN THE CX AND LAD SYSTEM. SEVERE LV DYSFUNCTION WITH DILATION OF THE LV. EF 15-20%. LV END-DIASTOLIC PRESSURE IS 90. +1 MR.  . CHOLECYSTECTOMY    . COLONOSCOPY N/A 03/03/2017   Procedure: COLONOSCOPY;  Surgeon: Danie Binder, MD;  Location: [facility] ENDO SUITE;  Service: Endoscopy;  Laterality: N/A;  . CYSTOSCOPY N/A 02/24/2013   Procedure:  CYSTOSCOPY WITH URETHRAL DILITATION;  Surgeon: Marissa Nestle, MD;  Location: [facility] ORS;  Service: Urology;  Laterality: N/A;  . DOPPLER ECHOCARDIOGRAPHY N/A 05/30/2010   LV SIZE IS NORMAL. LV SYSTOLIC FUNCTION IS LOW NORMAL. EF=50-55%. MILD INFERIOR HYPOKINESIS.MILD TO MODERATE POSTERIOR WALL HYPOKINESIS.PACEMAKER LEAD IN THE RV. LA IS MILDLY DILATED. RA IS MODERATE TO SEVERLY DILATED. PACEMAKER LEAD IN THE RA. MILD CALCICICATION OF THE MV APPARATUS. MODERATE MR. MILD TO MODERATE TR. MILD PHTN.AV MILDLY SCLEROTIC.  [name] ESOPHAGOGASTRODUODENOSCOPY N/A 10/13/2012   Dr. Gala Romney: severe ulcerative reflux esophagitis, question of Barrett's but negative path, single deep prepyloric antral ulcer, negative H.pylori  . ESOPHAGOGASTRODUODENOSCOPY N/A 03/03/2017   Procedure: ESOPHAGOGASTRODUODENOSCOPY (EGD);  Surgeon: Danie Binder, MD;  Location: [facility] ENDO SUITE;  Service: Endoscopy;  Laterality: N/A;  . HERNIA REPAIR     right inguinal hernia and umbilical  . LOWETR EXT VENOUS Bilateral 11-08-10   R & L- NO EVIDENCE OF THROMBUS OR THROMBOPHLEBITIS. THERE IS MILD AMOUNT OF SUBCUTANEOUS EDEMA NOTED WITHIN THE LEFT CALF AND ANKLE. R & L GSV AND SSV- NO VENOUS INSUFF NOTED.  [name] NECK SURGERY    . NUCLEAR STRESS TEST N/A 02/13/2009   NORMAL PATTERN OF PERFUSION IN ALL REGIONS. POST STRESS VENTICULAR SIZE IS NORMAL. POST  STESS EF 85%.  NORMAL MYOCARDIAL PERFUSION STUDY.  [name] PACEMAKER INSERTION    . POLYPECTOMY  03/03/2017   Procedure: POLYPECTOMY;  Surgeon: Danie Binder, MD;  Location: [facility] ENDO SUITE;  Service: Endoscopy;;  colon  . TONSILLECTOMY    . YAG LASER APPLICATION Bilateral 3/90/3009   Procedure: YAG LASER APPLICATION;  Surgeon: Williams Che, MD;  Location: [facility] ORS;  Service: Ophthalmology;  Laterality: Bilateral;     OB History    Gravida  5   Para  2   Term  1   Preterm  1   AB  3   Living  2     SAB  3   TAB      Ectopic      Multiple      Live Births               Home  Medications    Prior to Admission medications   Medication Sig Start Date End Date Taking? Authorizing Provider  carvedilol (COREG) 6.25 MG tablet TAKE 1 TABLET BY MOUTH 2  TIMES DAILY WITH A MEAL 03/29/18   Croitoru, Mihai, MD  ELIQUIS 2.5 MG TABS tablet TAKE 1 TABLET BY MOUTH  TWICE A DAY 03/29/18   Croitoru, Mihai, MD  furosemide (LASIX) 20 MG tablet TAKE 1 TABLET BY MOUTH  DAILY 03/29/18   Croitoru, Mihai, MD  mirtazapine (REMERON) 30 MG tablet Take 1 tablet (30 mg total) by mouth at bedtime. 03/29/18   Dettinger, Fransisca Kaufmann, MD  Multiple Vitamins-Minerals (PRESERVISION AREDS 2) CAPS Take 1 capsule daily by mouth.  [provider]  PARoxetine (PAXIL) 20 MG tablet Take 20 mg by mouth daily.  04/09/18   [provider]  Polyethyl Glycol-Propyl Glycol (SYSTANE ULTRA) 0.4-0.3 % SOLN Place 1-2 drops 2 (two) times daily as needed into both eyes (dry eyes).     [provider]  Polyethylene Glycol 400 (BLINK TEARS) 0.25 % SOLN Apply 2 drops to eye every 6 (six) hours.    [provider]  potassium chloride SA (K-DUR,KLOR-CON) 20 MEQ tablet Take 1 tablet (20 mEq total) by mouth daily. 04/24/18   Isaiah Serge, NP  sodium chloride (OCEAN) 0.65 % SOLN nasal spray Place 1 spray 2 (two) times daily as needed into both nostrils for congestion.     [provider]    Family History Family History  Problem Relation Age of Onset  . Cancer Sister   . Asthma Sister   . Heart failure Brother   . Pulmonary embolism Brother   . Cancer Brother   . Colon cancer Neg Hx     Social History Social History   Tobacco Use  . Smoking status: Former Smoker    Packs/day: 0.50    Years: 30.00    Pack years: 15.00    Types: Cigarettes    Last attempt to quit: 12/13/2004    Years since quitting: 13.3  . Smokeless tobacco: Never Used  . Tobacco comment: former smoker  Substance Use Topics  . Alcohol use: Yes    Alcohol/week: 0.0 standard drinks    Comment:  history of drinking a 1/2 glass of wine in the evening  . Drug use: No     Allergies   Ciprofloxacin; Flagyl [metronidazole]; Penicillins; and Sulfa antibiotics   Review of Systems Review of Systems  Respiratory: Positive for shortness of breath.   All other systems reviewed and are negative.    Physical Exam Updated Vital Signs There were no vitals taken for this visit.  Physical Exam Vitals signs and nursing note reviewed.  Constitutional:      General: She is not in acute distress.    Appearance: She is well-developed.     Comments: Well-appearing elderly female in no acute discomfort.  HENT:     Head: Atraumatic.  Eyes:     Conjunctiva/sclera: Conjunctivae normal.  Neck:     Musculoskeletal: Neck supple.     Vascular: No JVD.  Cardiovascular:     Rate and Rhythm: Normal rate and regular rhythm.  Pulmonary:     Breath sounds: Examination of the left-lower field reveals rales. Rales present. No wheezing or rhonchi.  Chest:     Chest wall: No tenderness.  Abdominal:     Palpations: Abdomen is soft.     Tenderness: There is no abdominal tenderness.  Musculoskeletal:     Right lower leg: No edema.     Left lower leg: Edema (1+ edema to left lower extremity extending towards the mid calf with tenderness about the anterior left ankle and faint bruising noted.) present.  Skin:    Findings: No rash.  Neurological:     Mental Status: She is alert.      ED Treatments / Results  Labs (all labs ordered are listed, but only abnormal results are displayed) Labs Reviewed  BASIC METABOLIC PANEL - Abnormal; Notable for the following components:      Result Value   CO2 20 (*)    Glucose, Bld 107 (*)    Creatinine, Ser 1.60 (*)    Calcium 8.5 (*)  GFR calc non Af Amer 28 (*)    GFR calc Af Amer 33 (*)    All other components within normal limits  CBC WITH DIFFERENTIAL/PLATELET - Abnormal; Notable for the following components:   RBC 3.14 (*)    Hemoglobin 10.7 (*)     HCT 33.0 (*)    MCV 105.1 (*)    MCH 34.1 (*)    Lymphs Abs 0.6 (*)    Abs Immature Granulocytes 0.11 (*)    All other components within normal limits  BRAIN NATRIURETIC PEPTIDE - Abnormal; Notable for the following components:   B Natriuretic Peptide 270.0 (*)    All other components within normal limits  I-STAT TROPONIN, ED    EKG EKG Interpretation  Date/Time:  "Sunday April 25 2018 19:20:15 EST Ventricular Rate:  75 PR Interval:    QRS Duration: 136 QT Interval:  442 QTC Calculation: 494 R Axis:   -95 Text Interpretation:  paced rhythm Right bundle branch block Inferior infarct, old similar to prior 12/19 Confirmed by Butler, Michael ([id number]) on 04/25/2018 7:31:28 PM   Radiology Dg Chest 2 View  Result Date: 04/25/2018 CLINICAL DATA:  Shortness of breath. EXAM: CHEST - 2 VIEW COMPARISON:  04/23/2018 FINDINGS: Lungs are hyperexpanded. Interstitial markings are diffusely coarsened with chronic features. The cardio pericardial silhouette is enlarged. Retrocardiac airspace disease superimposed on the spine on the lateral film suggests pneumonia. Pacer/AICD again noted. Bones are diffusely demineralized. IMPRESSION: 1. Emphysema 2. Retrocardiac atelectasis or infiltrate. Electronically Signed   By: Eric  Mansell M.D.   On: 04/25/2018 20:31    Procedures Procedures (including critical care time)  Medications Ordered in ED Medications  morphine 4 MG/ML injection 4 mg (4 mg Intravenous Given 04/25/18 2037)  furosemide (LASIX) injection 40 mg (40 mg Intravenous Given 04/25/18 2144)  doxycycline (VIBRA-TABS) tablet 100 mg (100 mg Oral Given 04/25/18 2144)     Initial Impression / Assessment and Plan / ED Course  I have reviewed the triage vital signs and the nursing notes.  Pertinent labs & imaging results that were available during my care of the patient were reviewed by me and considered in my medical decision making (see chart for details).  Clinical Course as of Apr 25 2034  Sun Apr 25, 2018  2033 84 year old female here with various complaints including shortness of breath swelling in her ankles recent falls various aches and pains.  She said she has been increasing her Lasix at her doctor's direction and it has not improved.  She is currently satting 100% and having no difficulty talking.  Vitals also look unremarkable.  She does have a pedal edema and has tender lower extremities.  She is getting some screening lab work and chest x-ray.   [MB]    Clinical Course User Index [MB] Butler, Michael C, MD    BP 128/65   Pulse 70   Temp 98.1 F (36.7 C) (Oral)   Resp 19   SpO2 96%    Final Clinical Impressions(s) / ED Diagnoses   Final diagnoses:  Community acquired pneumonia, unspecified laterality    ED Discharge Orders         Ordered    doxycycline (VIBRAMYCIN) 100 MG capsule  2 times daily     12" /22/19 2308         8:12 PM Patient complains of pain all over.  She has history of fibromyalgia and I suspect this is likely related to her fibromyalgia's.  She also complaining of increased  shortness of breath and have noticed water retention in her body.  She is currently on Lasix and has increased Lasix per recommendations of cardiology recently.  She had a fall and injured her left ankle.  Her left ankle is edematous and tender to palpation.  Recent x-ray without evidence of acute fracture or dislocation.  Work-up initiated, will give pain medication.  Will check BMP.  Low suspicion for PE  Or DVT causing her symptoms. Pt is speaking in complete sentences, is loquacious.  No evidence of respiratory compromise at this moment.  Care discussed with dr. Melina Copa.   11:04 PM BNP is mildly elevated at 270, chest x-ray shows signs of emphysema and a retrocardiac airspace disease superimposing the spine on the lateral film which suggest pneumonia.  When ambulate, patient maintains oxygen at 100% she is in no acute respiratory discomfort, no elevated white  count.  Will treat pneumonia with doxycycline, patient was given Lasix and she is stable to be discharged she can follow-up outpatient for further care.   Domenic Moras, PA-C 04/25/18 2310    Hayden Rasmussen, MD 04/27/18 [phone]

## 2018-04-25 NOTE — ED Notes (Signed)
Pt ambulated with the assistance of her walker w/o incident. SpO2 sats remained 100%.

## 2018-04-25 NOTE — ED Notes (Signed)
Pt given coffee. °

## 2018-04-26 ENCOUNTER — Telehealth: Payer: Self-pay | Admitting: Emergency Medicine

## 2018-04-26 NOTE — Telephone Encounter (Signed)
Pt was seen in ED yesterday and has f/u appt with Dr Warrick Parisian 05/10/18.

## 2018-04-26 NOTE — Telephone Encounter (Signed)
CM received a phone call from pt advising that her pharmacy did not have her prescription from her recent [facility] ED encounter.  CM noted that it was printed.  Pt advised she had her paperwork and realized it was on top when she reviewed it.  She told CM that she could not get the prescription to the pharmacy and they usually deliver to her.  She requested that medication be called into pharmacy.  Since medication is an ABX, CM advised her that it would be called in.  CM contacted [facility] who took prescription for doxycyline 100 mg bid x 7 days for a total of 14 capsules written by Rona Ravens, PA.  Also advised that pt has a paper copy of the same so disregard if pt brings paper copy in at a future date.  No further CM needs noted at this time.

## 2018-04-29 ENCOUNTER — Other Ambulatory Visit: Payer: Self-pay

## 2018-04-29 ENCOUNTER — Telehealth: Payer: Self-pay | Admitting: Cardiovascular Disease

## 2018-04-29 NOTE — Telephone Encounter (Signed)
Patient called the after-hours number because she was concerned about the edema.  Discussed fluid and sodium restrictions with her.  Advised her to limit sodium to 500 mg per meal and all liquids to 1.5 L/quarts daily. She states she does use some convenience foods but does not add salt to food so may be getting more sodium than she realizes.  Patient recently in the hospital and her renal function was basically at her baseline.  Patient stated she is taking Lasix 20 mg daily.  Requested that she take 2 pills, totaling 40 mg daily for the next 3 days.  If her weight stays stable, keep appointment on 05/06/2017 with Kerin Ransom, PA-C.  If her weight goes down, okay.  If her weight goes up, call us back.  Patient is agreeable with this plan of care.  Rosaria Ferries, PA-C 04/29/2018 6:47 PM Beeper [phone]

## 2018-04-29 NOTE — Telephone Encounter (Signed)
Spoke to pt who state she is still having c/o lower extremity edema. She reports today it seems to be worse in her left leg. Pt states SOB is no different than normal as she is always SOB. Appointment scheduled for pt to be seen on 1/2 at 29 with Kerin Ransom, PA. Will route to MD for any further recommendation. Pt also advised to report to ED if she becomes SOB or symptoms worsen.
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
## 2018-04-29 NOTE — Telephone Encounter (Signed)
  Pt c/o swelling: STAT is pt has developed SOB within 24 hours  1) How much weight have you gained and in what time span? na  2) If swelling, where is the swelling located? Both legs, ankle, feet. Left is worse than right  3) Are you currently taking a fluid pill? yes  4) Are you currently SOB? no  5) Do you have a log of your daily weights (if so, list)?no  6) Have you gained 3 pounds in a day or 5 pounds in a week? no  7) Have you traveled recently? No  Patient fell on 04/10/18 and she has had swelling since she fell it is just getting worse

## 2018-05-03 ENCOUNTER — Other Ambulatory Visit: Payer: Self-pay

## 2018-05-03 MED ORDER — PAROXETINE HCL 20 MG PO TABS: 20.0000 mg | ORAL_TABLET | Freq: Every day | ORAL | 0 refills | Status: DC

## 2018-05-04 DIAGNOSIS — R58 Hemorrhage, not elsewhere classified: Secondary | ICD-10-CM | POA: Diagnosis not present

## 2018-05-04 DIAGNOSIS — S81812A Laceration without foreign body, left lower leg, initial encounter: Secondary | ICD-10-CM | POA: Diagnosis not present

## 2018-05-04 DIAGNOSIS — W231XXA Caught, crushed, jammed, or pinched between stationary objects, initial encounter: Secondary | ICD-10-CM | POA: Diagnosis not present

## 2018-05-04 NOTE — Telephone Encounter (Signed)
See phone note from 04/29/18. 

## 2018-05-06 ENCOUNTER — Ambulatory Visit (INDEPENDENT_AMBULATORY_CARE_PROVIDER_SITE_OTHER): Payer: Medicare Other | Admitting: Cardiology

## 2018-05-06 ENCOUNTER — Encounter: Payer: Self-pay | Admitting: Cardiology

## 2018-05-06 DIAGNOSIS — Z950 Presence of cardiac pacemaker: Secondary | ICD-10-CM | POA: Diagnosis not present

## 2018-05-06 DIAGNOSIS — I428 Other cardiomyopathies: Secondary | ICD-10-CM

## 2018-05-06 DIAGNOSIS — I442 Atrioventricular block, complete: Secondary | ICD-10-CM | POA: Diagnosis not present

## 2018-05-06 DIAGNOSIS — K279 Peptic ulcer, site unspecified, unspecified as acute or chronic, without hemorrhage or perforation: Secondary | ICD-10-CM | POA: Diagnosis not present

## 2018-05-06 DIAGNOSIS — R0789 Other chest pain: Secondary | ICD-10-CM | POA: Diagnosis not present

## 2018-05-06 DIAGNOSIS — R079 Chest pain, unspecified: Secondary | ICD-10-CM | POA: Diagnosis not present

## 2018-05-06 DIAGNOSIS — N183 Chronic kidney disease, stage 3 unspecified: Secondary | ICD-10-CM

## 2018-05-06 NOTE — Patient Instructions (Signed)
Medication Instructions:   Your physician recommends that you continue on your current medications as directed. Please refer to the Current Medication list given to you today.  If you need a refill on your cardiac medications before your next appointment, please call your pharmacy.   Lab work: NONE  If you have labs (blood work) drawn today and your tests are completely normal, you will receive your results only by: [name] MyChart Message (if you have MyChart) OR . A paper copy in the mail If you have any lab test that is abnormal or we need to change your treatment, we will call you to review the results.  Testing/Procedures:   Follow-Up: At [facility], you and your health needs are our priority.  As part of our continuing mission to provide you with exceptional heart care, we have created designated Provider Care Teams.  These Care Teams include your primary Cardiologist (physician) and Advanced Practice Providers (APPs -  Physician Assistants and Nurse Practitioners) who all work together to provide you with the care you need, when you need it. You will need a follow up appointment in 12 months (January 2021).  Please call our office 2 months (Nov 2020) in advance to schedule this appointment.  You may see Dr. Sallyanne Kuster or one of the following Advanced Practice Providers on your designated Care Team: Almyra Deforest, [place] . Fabian Sharp, PA-C  Any Other Special Instructions Will Be Listed Below (If Applicable).

## 2018-05-06 NOTE — Assessment & Plan Note (Signed)
H/O prior transufusions

## 2018-05-06 NOTE — Assessment & Plan Note (Signed)
Pt here for her annual check

## 2018-05-06 NOTE — Assessment & Plan Note (Signed)
S/P AV node ablation.  Pacemaker dependent

## 2018-05-06 NOTE — Assessment & Plan Note (Signed)
Labs accompanied her- SCr 1.6

## 2018-05-06 NOTE — Progress Notes (Signed)
Thank you MCr 

## 2018-05-06 NOTE — Assessment & Plan Note (Signed)
EF 15% in 2007-s/p MDT Bi V ICD- changed to a Bi V Pacemaker in 2012 as she has been a responder- last EF 60-65% 2016

## 2018-05-06 NOTE — Progress Notes (Signed)
05/06/2018 Kimberly [name]   12/20/1926  [id number]  Primary Physician System, Pcp Not In Primary Cardiologist: Dr Sallyanne Kuster  HPI: Kimberly [name] is a pleasant 85 year old female who is been followed by our group with a history of an nonischemic cardiomyopathy.  In 2007 her ejection fraction was 15%.  She underwent implantation of a Medtronic by V ICD.  In 2012 this was changed out and downgraded to a by V pacemaker as her ejection fraction improved.  She was felt to be a "hyper" responder to the Bi V device.  Her last echocardiogram was in July 2016 and revealed an ejection fraction of 60 to 65%.  She is done well from this standpoint, she comes in annually for pacemaker check and has remote checks every 3 months.  Her last remote check in September 2019 indicated she had 28 months of battery left.  Other medical issues include PAF with a history of AV node ablation, chronic anticoagulation, chronic renal insufficiency stage III, nonobstructive coronary disease at catheterization May 2012, prior anemia and gastritis and peptic ulcer disease requiring transfusion in the past, scoliosis, and fibromyalgia.  She is in the office today for routine check.  She tells me she has a buyer for her house and is getting ready to move back to [place].  She says all her family is out there.   Current Outpatient Medications  Medication Sig Dispense Refill  . acetaminophen (TYLENOL) 500 MG tablet Take 1,000 mg by mouth every 6 (six) hours as needed for headache (pain).    . carvedilol (COREG) 6.25 MG tablet TAKE 1 TABLET BY MOUTH 2  TIMES DAILY WITH A MEAL (Patient taking differently: Take 6.25 mg by mouth 2 (two) times daily with a meal. ) 60 tablet 0  . doxycycline (VIBRAMYCIN) 100 MG capsule Take 1 capsule (100 mg total) by mouth 2 (two) times daily. One po bid x 7 days 14 capsule 0  . ELIQUIS 2.5 MG TABS tablet TAKE 1 TABLET BY MOUTH  TWICE A DAY (Patient taking differently: Take 2.5 mg by mouth 2 (two) times daily  with a meal. ) 180 tablet 1  . furosemide (LASIX) 20 MG tablet TAKE 1 TABLET BY MOUTH  DAILY (Patient taking differently: Take 40-60 mg by mouth daily. ) 90 tablet 3  . mirtazapine (REMERON) 30 MG tablet Take 1 tablet (30 mg total) by mouth at bedtime. 30 tablet 1  . Multiple Vitamins-Minerals (PRESERVISION AREDS 2) CAPS Take 1 capsule by mouth 2 (two) times daily.     [name] omeprazole (PRILOSEC OTC) 20 MG tablet Take 20 mg by mouth daily as needed (acid reflux).    [name] OVER THE COUNTER MEDICATION Take 1 capsule by mouth 2 (two) times daily. Omega XL    . PARoxetine (PAXIL) 20 MG tablet Take 1 tablet (20 mg total) by mouth daily. 90 tablet 0  . Polyethyl Glycol-Propyl Glycol (SYSTANE ULTRA) 0.4-0.3 % SOLN Place 1-2 drops 2 (two) times daily as needed into both eyes (dry eyes).     . potassium chloride SA (K-DUR,KLOR-CON) 20 MEQ tablet Take 1 tablet (20 mEq total) by mouth daily. 30 tablet 2  . sodium chloride (OCEAN) 0.65 % SOLN nasal spray Place 1 spray 2 (two) times daily as needed into both nostrils for congestion.     . traZODone (DESYREL) 50 MG tablet Take 50-150 mg by mouth at bedtime as needed for sleep.     No current facility-administered medications for this visit.  Allergies  Allergen Reactions  . Ciprofloxacin Other (See Comments)    Possibly caused diarrhea November 2018  . Flagyl [Metronidazole] Other (See Comments)    Possibly caused diarrhea November 2018  . Penicillins Hives    DID THE REACTION INVOLVE: Swelling of the face/tongue/throat, SOB, or low BP? Unknown Sudden or severe rash/hives, skin peeling, or the inside of the mouth or nose? Yes Did it require medical treatment? Yes When did it last happen?71 or 85 years old If all above answers are "NO", may proceed with cephalosporin use.  . Sulfa Antibiotics Rash    Past Medical History:  Diagnosis Date  . Acute blood loss anemia 10/11/2012  . Acute diverticulitis 08/24/2013  . Acute on chronic combined systolic  and diastolic CHF, NYHA class 4 ([place]) 11/15/2013  . Antral ulcer 10/11/2012  . Arrhythmia    atrial fibb  . Atrial fibrillation ([place])   . Cardiomyopathy, nonischemic ([place])   . Chronic anticoagulation 10/12/2012  . CKD (chronic kidney disease) stage 3, GFR 30-59 ml/min (HCC) 10/12/2012  . Depression   . Erosive esophagitis 10/11/2012  . Fibromyalgia   . Glaucoma   . H/O echocardiogram 2007   EF 40-45%,         . Hypertension   . Osteoarthritis   . Pacemaker    Last saw cards 07/2013  . Scoliosis     Social History   Socioeconomic History  . Marital status: Widowed    Spouse name: Not on file  . Number of children: Not on file  . Years of education: Not on file  . Highest education level: Not on file  Occupational History  . 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 Financial resource strain: Not on file  . Food insecurity:    Worry: Not on file    Inability: Not on file  . Transportation needs:    Medical: Not on file    Non-medical: Not on file  Tobacco Use  . Smoking status: Former Smoker    Packs/day: 0.50    Years: 30.00    Pack years: 15.00    Types: Cigarettes    Last attempt to quit: 12/13/2004    Years since quitting: 13.4  . Smokeless tobacco: Never Used  . Tobacco comment: former smoker  Substance and Sexual Activity  . Alcohol use: Yes    Alcohol/week: 0.0 standard drinks    Comment: history of drinking a 1/2 glass of wine in the evening  . Drug use: No  . Sexual activity: Never  Lifestyle  . Physical activity:    Days per week: Not on file    Minutes per session: Not on file  . Stress: Not on file  Relationships  . Social connections:    Talks on phone: Not on file    Gets together: Not on file    Attends religious service: Not on file    Active member of club or organization: Not on file    Attends meetings of clubs or organizations: Not on file    Relationship status: Not on file  . Intimate  partner violence:    Fear of current or ex partner: Not on file    Emotionally abused: Not on file    Physically abused: Not on file    Forced sexual activity: Not on file  Other Topics Concern  . Not on file  Social History Narrative  . Not on file  Family History  Problem Relation Age of Onset  . Cancer Sister   . Asthma Sister   . Heart failure Brother   . Pulmonary embolism Brother   . Cancer Brother   . Colon cancer Neg Hx      Review of Systems: General: negative for chills, fever, night sweats or weight changes.  Cardiovascular: negative for chest pain, dyspnea on exertion, edema, orthopnea, palpitations, paroxysmal nocturnal dyspnea or shortness of breath Dermatological: negative for rash Respiratory: negative for cough or wheezing Urologic: negative for hematuria Abdominal: negative for nausea, vomiting, diarrhea, bright red blood per rectum, melena, or hematemesis Neurologic: negative for visual changes, syncope, or dizziness Laceration Lt ankle-staples have been placed All other systems reviewed and are otherwise negative except as noted above.    Blood pressure 130/66, pulse 73, height 5\' 1"  (1.549 m), weight 147 lb (66.7 kg).  General appearance: alert, cooperative, appears stated age and no distress Neck: no JVD Lungs: clear lungs, severe kyphoscoliosis Heart: regular rate and rhythm Extremities: no edema Skin: no edema, dressing on Lt shin Neurologic: Grossly normal   ASSESSMENT AND PLAN:   Biventricular cardiac pacemaker - Medtronic Consulta Pt here for her annual check    CKD (chronic kidney disease) stage 3, GFR 30-59 ml/min (HCC) Labs accompanied her- SCr 1.6  Complete heart block (HCC) S/P AV node ablation.  Pacemaker dependent  Non-ischemic cardiomyopathy ([place]) EF 15% in 2007-s/p MDT Bi V ICD- changed to a Bi V Pacemaker in 2012 as she has been a responder- last EF 60-65% 2016  PUD (peptic ulcer disease) H/O prior  transufusions   PLAN  Pacer check- F/U in a year if she is still in [place].   Kerin Ransom PA-C 05/06/2018 12:53 PM

## 2018-05-07 NOTE — Progress Notes (Signed)
Pacemaker download reviewed. Normal device function.  Estimated generator longevity 2 years.  Both right and left ventricular leads have normal parameters.  Device is programmed VVIR with LV first pacing Very brief episodes of nonsustained VT recorded.  Otherwise has 93.7% biventricular pacing Remarkable abrupt increase in activity level noticed the first couple of weeks of December going from an average of an hour a day over the preceding year to over 6 hours a day.  Now seems to be returning to baseline gradually. OptiVol without evidence of volume overload.  Sanda Klein, MD, [facility] [phone] office [phone] pager

## 2018-05-10 ENCOUNTER — Other Ambulatory Visit: Payer: Self-pay

## 2018-05-10 ENCOUNTER — Encounter: Payer: Medicare Other | Admitting: Family Medicine
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
## 2018-05-11 DIAGNOSIS — R600 Localized edema: Secondary | ICD-10-CM | POA: Diagnosis not present

## 2018-05-11 DIAGNOSIS — R109 Unspecified abdominal pain: Secondary | ICD-10-CM | POA: Diagnosis not present

## 2018-05-11 DIAGNOSIS — L03116 Cellulitis of left lower limb: Secondary | ICD-10-CM | POA: Diagnosis not present

## 2018-05-13 ENCOUNTER — Other Ambulatory Visit: Payer: Self-pay | Admitting: Cardiovascular Disease

## 2018-05-14 DIAGNOSIS — Z4802 Encounter for removal of sutures: Secondary | ICD-10-CM | POA: Diagnosis not present

## 2018-05-16 DIAGNOSIS — Z881 Allergy status to other antibiotic agents status: Secondary | ICD-10-CM | POA: Diagnosis not present

## 2018-05-16 DIAGNOSIS — S81812A Laceration without foreign body, left lower leg, initial encounter: Secondary | ICD-10-CM | POA: Diagnosis not present

## 2018-05-16 DIAGNOSIS — Z79899 Other long term (current) drug therapy: Secondary | ICD-10-CM | POA: Diagnosis not present

## 2018-05-16 DIAGNOSIS — N183 Chronic kidney disease, stage 3 (moderate): Secondary | ICD-10-CM | POA: Diagnosis not present

## 2018-05-16 DIAGNOSIS — Z950 Presence of cardiac pacemaker: Secondary | ICD-10-CM | POA: Diagnosis not present

## 2018-05-16 DIAGNOSIS — I129 Hypertensive chronic kidney disease with stage 1 through stage 4 chronic kidney disease, or unspecified chronic kidney disease: Secondary | ICD-10-CM | POA: Diagnosis not present

## 2018-05-16 DIAGNOSIS — M1990 Unspecified osteoarthritis, unspecified site: Secondary | ICD-10-CM | POA: Diagnosis not present

## 2018-05-16 DIAGNOSIS — Z91041 Radiographic dye allergy status: Secondary | ICD-10-CM | POA: Diagnosis not present

## 2018-05-16 DIAGNOSIS — Z809 Family history of malignant neoplasm, unspecified: Secondary | ICD-10-CM | POA: Diagnosis not present

## 2018-05-16 DIAGNOSIS — D619 Aplastic anemia, unspecified: Secondary | ICD-10-CM | POA: Diagnosis not present

## 2018-05-16 DIAGNOSIS — R52 Pain, unspecified: Secondary | ICD-10-CM | POA: Diagnosis not present

## 2018-05-16 DIAGNOSIS — Z87891 Personal history of nicotine dependence: Secondary | ICD-10-CM | POA: Diagnosis not present

## 2018-05-16 DIAGNOSIS — I482 Chronic atrial fibrillation, unspecified: Secondary | ICD-10-CM | POA: Diagnosis not present

## 2018-05-16 DIAGNOSIS — I10 Essential (primary) hypertension: Secondary | ICD-10-CM | POA: Diagnosis not present

## 2018-05-16 DIAGNOSIS — L03116 Cellulitis of left lower limb: Secondary | ICD-10-CM | POA: Diagnosis not present

## 2018-05-16 DIAGNOSIS — I4891 Unspecified atrial fibrillation: Secondary | ICD-10-CM | POA: Diagnosis not present

## 2018-05-16 DIAGNOSIS — I878 Other specified disorders of veins: Secondary | ICD-10-CM | POA: Diagnosis not present

## 2018-05-16 DIAGNOSIS — Z888 Allergy status to other drugs, medicaments and biological substances status: Secondary | ICD-10-CM | POA: Diagnosis not present

## 2018-05-16 DIAGNOSIS — R609 Edema, unspecified: Secondary | ICD-10-CM | POA: Diagnosis not present

## 2018-05-16 DIAGNOSIS — Z8249 Family history of ischemic heart disease and other diseases of the circulatory system: Secondary | ICD-10-CM | POA: Diagnosis not present

## 2018-05-16 DIAGNOSIS — S81812D Laceration without foreign body, left lower leg, subsequent encounter: Secondary | ICD-10-CM | POA: Diagnosis not present

## 2018-05-16 DIAGNOSIS — Z882 Allergy status to sulfonamides status: Secondary | ICD-10-CM | POA: Diagnosis not present

## 2018-05-16 DIAGNOSIS — Z880 Allergy status to penicillin: Secondary | ICD-10-CM | POA: Diagnosis not present

## 2018-05-17 ENCOUNTER — Encounter: Payer: Self-pay | Admitting: Gastroenterology

## 2018-05-17 DIAGNOSIS — L03116 Cellulitis of left lower limb: Secondary | ICD-10-CM | POA: Diagnosis not present

## 2018-05-25 ENCOUNTER — Other Ambulatory Visit: Payer: Self-pay | Admitting: Cardiovascular Disease

## 2018-05-25 DIAGNOSIS — Z950 Presence of cardiac pacemaker: Secondary | ICD-10-CM

## 2018-05-25 DIAGNOSIS — I5043 Acute on chronic combined systolic (congestive) and diastolic (congestive) heart failure: Secondary | ICD-10-CM

## 2018-05-25 MED ORDER — FUROSEMIDE 20 MG PO TABS: 20.0000 mg | ORAL_TABLET | Freq: Every day | ORAL | 3 refills | Status: DC

## 2018-05-25 NOTE — Telephone Encounter (Signed)
° ° ° °*  STAT* If patient is at the pharmacy, call can be transferred to refill team.   1. Which medications need to be refilled? (please list name of each medication and dose if known) furosemide (LASIX) 20 MG tablet  2. Which pharmacy/location (including street and city if local pharmacy) is medication to be sent to? [place], [place]  3. Do they need a 30 day or 90 day supply? [place]

## 2018-05-26 ENCOUNTER — Ambulatory Visit: Payer: Medicare Other | Admitting: Family Medicine

## 2018-05-30 LAB — CUP PACEART REMOTE DEVICE CHECK
Battery Remaining Longevity: 27 mo
Battery Voltage: 2.92 V
Brady Statistic AP VP Percent: 0 %
Brady Statistic AP VS Percent: 0 %
Brady Statistic AS VS Percent: 6.29 %
Brady Statistic RA Percent Paced: 0 %
Brady Statistic RV Percent Paced: 98.25 %
Date Time Interrogation Session: 20191213221157
Implantable Lead Implant Date: 20070831
Implantable Lead Location: 753858
Implantable Lead Location: 753860
Implantable Lead Model: 4024
Implantable Lead Model: 4194
Implantable Pulse Generator Implant Date: 20120619
Lead Channel Impedance Value: 4047 Ohm
Lead Channel Impedance Value: 494 Ohm
Lead Channel Impedance Value: 494 Ohm
Lead Channel Impedance Value: 532 Ohm
Lead Channel Impedance Value: 684 Ohm
Lead Channel Impedance Value: 741 Ohm
Lead Channel Impedance Value: 931 Ohm
Lead Channel Pacing Threshold Amplitude: 0.5 V
Lead Channel Pacing Threshold Amplitude: 0.75 V
Lead Channel Pacing Threshold Pulse Width: 0.4 ms
Lead Channel Pacing Threshold Pulse Width: 1 ms
Lead Channel Sensing Intrinsic Amplitude: 11.625 mV
Lead Channel Sensing Intrinsic Amplitude: 11.625 mV
Lead Channel Setting Pacing Amplitude: 2 V
Lead Channel Setting Pacing Amplitude: 2 V
Lead Channel Setting Pacing Pulse Width: 0.4 ms
Lead Channel Setting Pacing Pulse Width: 1 ms
Lead Channel Setting Sensing Sensitivity: 2.8 mV
MDC IDC LEAD IMPLANT DT: 19971124
MDC IDC MSMT LEADCHNL LV IMPEDANCE VALUE: 532 Ohm
MDC IDC MSMT LEADCHNL RA IMPEDANCE VALUE: 4047 Ohm
MDC IDC STAT BRADY AS VP PERCENT: 93.71 %

## 2018-06-02 ENCOUNTER — Other Ambulatory Visit: Payer: Self-pay | Admitting: *Deleted

## 2018-06-02 ENCOUNTER — Encounter: Payer: Self-pay | Admitting: *Deleted

## 2018-06-02 ENCOUNTER — Ambulatory Visit: Payer: Medicare Other | Admitting: Gastroenterology

## 2018-06-02 ENCOUNTER — Telehealth: Payer: Self-pay | Admitting: *Deleted

## 2018-06-02 ENCOUNTER — Encounter: Payer: Self-pay | Admitting: Gastroenterology

## 2018-06-02 DIAGNOSIS — R1030 Lower abdominal pain, unspecified: Secondary | ICD-10-CM

## 2018-06-02 DIAGNOSIS — M549 Dorsalgia, unspecified: Secondary | ICD-10-CM

## 2018-06-02 DIAGNOSIS — R269 Unspecified abnormalities of gait and mobility: Secondary | ICD-10-CM

## 2018-06-02 MED ORDER — OXYCODONE HCL 5 MG PO TABS: ORAL_TABLET | ORAL | 0 refills | Status: DC

## 2018-06-02 NOTE — Assessment & Plan Note (Addendum)
MOST LIKELY DUE TO ABDOMINAL WALL PAIN/ALTERED BODY MECHANICS AND REFERRED PAIN FROM LUMBAR DISC DISEASE. RECENT TCS/CT SHOWED NO ACUTE INTRAABDOMINAL PATHOLOGY.  COMPLETE PHYSICAL THERAPY FOR STRENGTH AND IONTOPHORESIS. I SPOKE WITH THE RADIOLOGIST AND REVIEWED YOUR CT FROM DEC 2019 WITH DR. Thornton Papas. RECOMMENDED CT OF YOUR LUMBAR SPINE.  USE OXYCODONE AT BEDTIME TO CONTROL PAIN. USE TYLENOL OR IBUPROFEN WITH FOOD OR MILK DURING THE DAY. USE NO MORE THAN 6 OTC IBUPROFEN/DAY.  FOLLOW UP IN 3 MOS.

## 2018-06-02 NOTE — Progress Notes (Signed)
cc'd to pcp 

## 2018-06-02 NOTE — Progress Notes (Signed)
Patient Scheduled

## 2018-06-02 NOTE — Progress Notes (Signed)
Subjective:    Patient ID: Kimberly [name], female    DOB: May 02, 1927, 85 y.o.   MRN: [id number]  HPI Pain IN RIGHT SIDE AND CALLED EMS 5 TIMES. STILL DRINKING MILK. BMs: MOSTLY EVERY DAY AND MAY MISS A DAY. COMES OUT WITH EASE. RARE DIARRHEA. ALWAYS SOB. SEEN IN ED AT [facility] FOR CHEST PAIN. FEELS LIKE IT'S WORSE. RLQ PAIN: SHARP ALL THE TIME. MAY KEEP HER UP AT NIGHT: 1-2X/WEEK. NO RADIATION. NEVER BULGES.  WORSE: NOT WITH MOVEMENT. LAST CT ABD/[facility]LVIS DEC 2019: R KIDNEY STONES. SEEN IN ED AND HAD CT HEAD/NECK: NO ACUTE DISEASE. HEARTBURN: 1-2X/WEEK. MEDS CONTROL HER HEARTBURN. URINATES IN THE MIDDLE OF THE NIGHT: NO WORSE. LOWER BACK ALL THE TIME. NO OSTEOPOROSIS. HAD FIBROMYALGIA DIAGNOSIS FOR A LONG TIME. PAXIL KEEPS HER CALM/ANXIETY. WERE OFF FOR YEARS AND JUST WENT BACK ON IT FOR PAST 2-3 MOS. PAIN NO BETTER WITH PAXIL. DOESN'T KNOW OF ANYTHING THAT MAKES IT BETTER. CHEESE: ONCE A WEEK. ICE CREAM: ALMOST DAILY. LAST UROLOGY VISIT 2014: DOESN'T REMEMBER WHY SHE HAD CYSTOSCOPY/DIL. NUMBNESS AND TINGLING  IN YOUR LEGS.  PT DENIES FEVER, CHILLS, HEMATOCHEZIA, INCOMPLETE EMPTYING HER BLADDER DYSURIA, HEMATURIA, HEMATEMESIS, nausea, vomiting, melena, diarrhea,  CHANGE IN BOWEL IN HABITS, constipation, problems swallowing, problems with sedation, heartburn or indigestion.  Past Medical History:  Diagnosis Date  . Acute blood loss anemia 10/11/2012  . Acute diverticulitis 08/24/2013  . Acute on chronic combined systolic and diastolic CHF, NYHA class 4 ([place]) 11/15/2013  . Antral ulcer 10/11/2012  . Arrhythmia    atrial fibb  . Atrial fibrillation ([place])   . Cardiomyopathy, nonischemic ([place])   . Chronic anticoagulation 10/12/2012  . CKD (chronic kidney disease) stage 3, GFR 30-59 ml/min (HCC) 10/12/2012  . Depression   . Erosive esophagitis 10/11/2012  . Fibromyalgia   . Glaucoma   . H/O echocardiogram 2007   EF 40-45%,         . Hypertension   . Osteoarthritis   . Pacemaker    Last saw cards 07/2013  .  Scoliosis    Past Surgical History:  Procedure Laterality Date  . ABDOMINAL HYSTERECTOMY    . APPENDECTOMY    . BACK SURGERY    . BIOPSY  03/03/2017   Procedure: BIOPSY;  Surgeon: Danie Binder, MD;  Location: [facility];  Service: Endoscopy;;  gastric  . BREAST SURGERY    . CARDIAC CATHETERIZATION  12/08/2005   LAD AND LEFT MAIN WITH NO HIGH-GRADE STENOSIS. MILD DISEASE IN THE CX AND LAD SYSTEM. SEVERE LV DYSFUNCTION WITH DILATION OF THE LV. EF 15-20%. LV END-DIASTOLIC PRESSURE IS 90. +1 MR.  . CHOLECYSTECTOMY    . COLONOSCOPY N/A 03/03/2017   Procedure: COLONOSCOPY;  Surgeon: Danie Binder, MD;  Location: [facility];  Service: Endoscopy;  Laterality: N/A;  . CYSTOSCOPY N/A 02/24/2013   Procedure: CYSTOSCOPY WITH URETHRAL DILITATION;  Surgeon: Marissa Nestle, MD;  Location: [facility] ORS;  Service: Urology;  Laterality: N/A;  . DOPPLER ECHOCARDIOGRAPHY N/A 05/30/2010   LV SIZE IS NORMAL. LV SYSTOLIC FUNCTION IS LOW NORMAL. EF=50-55%. MILD INFERIOR HYPOKINESIS.MILD TO MODERATE POSTERIOR WALL HYPOKINESIS.PACEMAKER LEAD IN THE RV. LA IS MILDLY DILATED. RA IS MODERATE TO SEVERLY DILATED. PACEMAKER LEAD IN THE RA. MILD CALCICICATION OF THE MV APPARATUS. MODERATE MR. MILD TO MODERATE TR. MILD PHTN.AV MILDLY SCLEROTIC.  [name] ESOPHAGOGASTRODUODENOSCOPY N/A 10/13/2012   Dr. Gala Romney: severe ulcerative reflux esophagitis, question of Barrett's but negative path, single deep prepyloric antral ulcer, negative H.pylori  .  ESOPHAGOGASTRODUODENOSCOPY N/A 03/03/2017   Procedure: ESOPHAGOGASTRODUODENOSCOPY (EGD);  Surgeon: Danie Binder, MD;  Location: [facility];  Service: Endoscopy;  Laterality: N/A;  . HERNIA REPAIR     right inguinal hernia and umbilical  . LOWETR EXT VENOUS Bilateral 11-08-10   R & L- NO EVIDENCE OF THROMBUS OR THROMBOPHLEBITIS. THERE IS MILD AMOUNT OF SUBCUTANEOUS EDEMA NOTED WITHIN THE LEFT CALF AND ANKLE. R & L GSV AND SSV- NO VENOUS INSUFF NOTED.  [name] NECK SURGERY    . NUCLEAR  STRESS TEST N/A 02/13/2009   NORMAL PATTERN OF PERFUSION IN ALL REGIONS. POST STRESS VENTICULAR SIZE IS NORMAL. POST  STESS EF 85%.  NORMAL MYOCARDIAL PERFUSION STUDY.  [name] PACEMAKER INSERTION    . POLYPECTOMY  03/03/2017   Procedure: POLYPECTOMY;  Surgeon: Danie Binder, MD;  Location[facility];  Service: Endoscopy;;  colon  . TONSILLECTOMY    . YAG LASER APPLICATION Bilateral [id number]/73/7106   Procedure: YAG LASER APPLICATION;  Surgeon: Williams Che, MD;  Location: [facility] ORS;  Service: Ophthalmology;  Laterality: Bilateral;   Allergies  Allergen Reactions  . Ciprofloxacin Other (See Comments)    Possibly caused diarrhea November 2018  . Flagyl [Metronidazole] Other (See Comments)    Possibly caused diarrhea November 2018  . Penicillins Hives      . Sulfa Antibiotics Rash   Current Outpatient Medications  Medication Sig    . acetaminophen (TYLENOL) 500 MG tablet Take 1,000 mg by mouth every 6 (six) hours as needed for headache (pain).    . carvedilol (COREG) 6.25 MG tablet Take 1 tablet (6.25 mg total) by mouth 2 (two) times daily with a meal.    . ELIQUIS 2.5 MG TABS tablet Take 2.5 mg by mouth 2 (two) times daily with a meal. )    . furosemide (LASIX) 20 MG tablet Take 1 tablet (20 mg total) by mouth daily.    . mirtazapine (REMERON) 30 MG tablet Take 1 tablet (30 mg total) by mouth at bedtime.    . Multiple Vitamins-Minerals (PRESERVISION AREDS 2) CAPS Take 1 capsule by mouth 2 (two) times daily.     [name] omeprazole (PRILOSEC OTC) 20 MG tablet Take 20 mg by mouth daily as needed (acid reflux).    [name] OVER THE COUNTER MEDICATION Take 1 capsule by mouth 2 (two) times daily. Omega XL    . PARoxetine (PAXIL) 20 MG tablet Take 1 tablet (20 mg total) by mouth daily.    Vladimir Faster Glycol-Propyl Glycol (SYSTANE ULTRA) 0.4-0.3 % SOLN Place 1-2 drops 2 (two) times daily as needed into both eyes (dry eyes).     . potassium chloride SA (K-DUR,KLOR-CON) 20 MEQ tablet Take 1 tablet (20 mEq total) by  mouth daily.    . sodium chloride (OCEAN) 0.65 % SOLN nasal spray Place 1 spray 2 (two) times daily as needed into both nostrils for congestion.     . traZODone (DESYREL) 50 MG tablet Take 50-150 mg by mouth at bedtime as needed for sleep.    .       Review of Systems PER HPI OTHERWISE ALL SYSTEMS ARE NEGATIVE.    Objective:   Physical Exam Vitals signs reviewed.  Constitutional:      General: She is not in acute distress.    Appearance: She is well-developed.  HENT:     Head: Normocephalic and atraumatic.     Mouth/Throat:     Pharynx: No oropharyngeal exudate.  Eyes:     General:  No scleral icterus.    Pupils: Pupils are equal, round, and reactive to light.  Neck:     Musculoskeletal: Neck supple. No muscular tenderness.  Cardiovascular:     Rate and Rhythm: Normal rate. Rhythm irregular.     Heart sounds: Normal heart sounds.     Comments: PACER POCKET IN LEFT UPPER CHEST Pulmonary:     Effort: Pulmonary effort is normal. No respiratory distress.     Breath sounds: Normal breath sounds.  Abdominal:     General: Bowel sounds are normal. There is no distension.     Palpations: Abdomen is soft.     Tenderness: There is abdominal tenderness. There is no guarding (MILD RLQ) or rebound.     Comments: POSITIVE CARNETT'S SIGN WHEN RAISING ARMS AND CHEST OFF EXAM TABLE. BACK PAIN ELICITED WITH RAISING LEGS OFF THE TABLE.   Musculoskeletal:        General: Deformity (HANDS BILATERAL AND SPINE) present.     Right lower leg: Edema present.     Left lower leg: Edema present.     Comments: WALKS ASSISTED WITH A WALKER, ABLE TO GET ONE EXAM TABLE WITH MINIMAL ASSISTANCE. WIDE BASED GAIT. SEVERE SCOLIOSIS  Lymphadenopathy:     Cervical: No cervical adenopathy.  Skin:    Comments: DRY DRESSING ON LEFT LEG  Neurological:     Mental Status: She is alert and oriented to person, place, and time.  Psychiatric:     Comments: FLAT AFFECT, NL MOOD           Assessment & Plan:

## 2018-06-02 NOTE — Telephone Encounter (Signed)
[place] website and no PA is required for CT spine.

## 2018-06-02 NOTE — Patient Instructions (Addendum)
COMPLETE PHYSICAL THERAPY FOR STRENGTH AND IONTOPHORESIS.   I SPOKE WITH THE RADIOLOGIST AND REVIEWED YOUR CT FROM DEC 2019. HE RECOMMENDED CT OF YOUR LUMBAR SPINE.  USE OXYCODONE AT BEDTIME TO CONTROL PAIN. USE TYLENOL OR IBUPROFEN WITH FOOD OR MILK DURING THE DAY. USE NO MORE THAN 6 OTC IBUPROFEN/DAY.  FOLLOW UP IN 3 MOS.

## 2018-06-04 LAB — CUP PACEART INCLINIC DEVICE CHECK
Date Time Interrogation Session: 20200131163536
Implantable Lead Implant Date: 19971124
Implantable Lead Implant Date: 20070831
Implantable Lead Location: 753858
Implantable Lead Location: 753860
Implantable Lead Model: 4024
Implantable Lead Model: 4194
Implantable Pulse Generator Implant Date: 20120619

## 2018-06-11 ENCOUNTER — Ambulatory Visit (HOSPITAL_COMMUNITY): Payer: Medicare Other

## 2018-06-15 ENCOUNTER — Telehealth: Payer: Self-pay

## 2018-06-15 ENCOUNTER — Ambulatory Visit (HOSPITAL_COMMUNITY): Payer: Medicare Other

## 2018-06-15 NOTE — Telephone Encounter (Signed)
I called and informed Kimberly [name], she said they will have [facility] go out and assess her and if there is a problem she will let us know.

## 2018-06-15 NOTE — Telephone Encounter (Signed)
If she is able to accomplish at home what has been ordered, it shouldn't be a problem. However, if PT is unable to do the strength exercises needed at home, I recommend going to where this is accomplished.

## 2018-06-15 NOTE — Telephone Encounter (Signed)
T/C from [employer] from [employer] [phone]), asking if Dr. Oneida Alar would approve for PT to be done in pt's home instead of at the office.  Pt is refusing to leave the house to do the PT.  Forwarding to Roseanne Kaufman, NP in Dr. Artis Flock absence.   Vicente Males, please advise!

## 2018-06-22 ENCOUNTER — Telehealth: Payer: Self-pay | Admitting: Gastroenterology

## 2018-06-22 ENCOUNTER — Ambulatory Visit (HOSPITAL_COMMUNITY)
Admission: RE | Admit: 2018-06-22 | Discharge: 2018-06-22 | Disposition: A | Discharge status: Home / Self Care | Payer: Medicare Other | Source: Ambulatory Visit | Attending: Gastroenterology | Admitting: Gastroenterology

## 2018-06-22 DIAGNOSIS — R1030 Lower abdominal pain, unspecified: Secondary | ICD-10-CM | POA: Diagnosis not present

## 2018-06-22 DIAGNOSIS — M549 Dorsalgia, unspecified: Secondary | ICD-10-CM | POA: Insufficient documentation

## 2018-06-22 DIAGNOSIS — M5116 Intervertebral disc disorders with radiculopathy, lumbar region: Secondary | ICD-10-CM | POA: Diagnosis not present

## 2018-06-22 DIAGNOSIS — M48061 Spinal stenosis, lumbar region without neurogenic claudication: Secondary | ICD-10-CM | POA: Diagnosis not present

## 2018-06-22 NOTE — Telephone Encounter (Signed)
PLEASE CALL PT. THE CT SCAN SHOWS SEVER SCOLIOSIS IN HER LOWER BACK, MODERATE TO SEVERE DEGENERATIVE DISC DISEASE, OSTEOPOROSIS WITH A CHRONIC COMPRESSION FRACTURE,.  HER LOWER ABDOMINAL PAIN IS MOST LIKELY DUE TO ABDOMINAL WALL PAIN/ALTERED BODY MECHANICS AND REFERRED PAIN FROM LUMBAR DISC DISEASE. CONTINUE WITH  PHYSICAL THERAPY FOR STRENGTH.

## 2018-06-23 NOTE — Telephone Encounter (Signed)
PLEASE CALL PT'S CARE GIVER. SHE HAS BACK PAIN, DEGENERATIVE DISC DISEASE,AND OSTEOPOROSIS AND I WILL MAKE A REFERRAL PT FOR HOME HEALTH PT. SHE WILL NEED TO SEE HER PCP FOR RENEWAL OF THE REFERRAL, TO MANAGE HER OSTEOPOROSIS, AND IF HER BACK PAIN DOES NOT IMPROVE.

## 2018-06-23 NOTE — Telephone Encounter (Signed)
Joannie- community paramedic with [facility] health care team called ( she stated she is the one that came with the pt to her appointment)- she stated she talked to [facility] last week about the pt needing to do PT at home d/t the pt said she wouldn't be able to go anywhere to do PT. She said they are needing a referral to home health because they are unable to do referrals.   Dr.Fields, is it ok to refer the pt to home health for PT?

## 2018-06-23 NOTE — Telephone Encounter (Signed)
Kimberly [name] is working on referral.

## 2018-06-23 NOTE — Telephone Encounter (Signed)
Kimberly [name] is aware. Ok to fax [facility]erral as usual.

## 2018-06-23 NOTE — Telephone Encounter (Signed)
LMOM for a return call from [place].

## 2018-06-23 NOTE — Telephone Encounter (Signed)
Kimberly [name], can you send a referral to [facility] please.

## 2018-06-24 ENCOUNTER — Ambulatory Visit (HOSPITAL_COMMUNITY): Payer: Medicare Other | Admitting: Physical Therapy

## 2018-06-24 NOTE — Telephone Encounter (Signed)
LMOM for pt and called her daughter Santiago Glad and informed her of the results.

## 2018-06-24 NOTE — Telephone Encounter (Signed)
PAPERWORK COMPLETE. 

## 2018-06-24 NOTE — Telephone Encounter (Signed)
Faxed paperwork to [facility].

## 2018-06-24 NOTE — Telephone Encounter (Signed)
HOME PT PAPER WORK COMPLETE.

## 2018-06-24 NOTE — Telephone Encounter (Signed)
Paperwork placed on Dr. Nona Dell seat to complete and I will fax.

## 2018-07-14 ENCOUNTER — Ambulatory Visit (HOSPITAL_COMMUNITY): Payer: Medicare Other | Attending: Gastroenterology

## 2018-07-19 ENCOUNTER — Other Ambulatory Visit: Payer: Self-pay

## 2018-07-19 ENCOUNTER — Encounter: Payer: Medicare Other | Admitting: *Deleted

## 2018-07-20 ENCOUNTER — Telehealth: Payer: Self-pay

## 2018-07-20 NOTE — Telephone Encounter (Signed)
Spoke with patient to remind of missed remote transmission 

## 2018-07-25 ENCOUNTER — Other Ambulatory Visit: Payer: Self-pay | Admitting: Family Medicine

## 2018-07-26 NOTE — Telephone Encounter (Signed)
Yes we need to clarify that PCP think because if she is seeing a different physician is a PCP than the request need to send to that physician, looks like her current PCP is listed as a Designer, jewellery that works at [employer] family [facility], we will likely need to give her a call and ask about this

## 2018-07-26 NOTE — Telephone Encounter (Signed)
Na-cb 3/23

## 2018-07-26 NOTE — Telephone Encounter (Signed)
Last seen here by [place] 03/15/18. She has a new PCP - per epic

## 2018-08-02 ENCOUNTER — Other Ambulatory Visit: Payer: Self-pay

## 2018-08-02 MED ORDER — APIXABAN 2.5 MG PO TABS: 2.5000 mg | ORAL_TABLET | Freq: Two times a day (BID) | ORAL | 1 refills | Status: DC

## 2018-08-12 DIAGNOSIS — L03116 Cellulitis of left lower limb: Secondary | ICD-10-CM | POA: Diagnosis not present

## 2018-08-30 ENCOUNTER — Other Ambulatory Visit: Payer: Self-pay | Admitting: Family Medicine

## 2018-09-02 ENCOUNTER — Other Ambulatory Visit: Payer: Self-pay

## 2018-09-02 ENCOUNTER — Ambulatory Visit (INDEPENDENT_AMBULATORY_CARE_PROVIDER_SITE_OTHER): Payer: Medicare Other | Admitting: Gastroenterology

## 2018-09-02 ENCOUNTER — Encounter: Payer: Self-pay | Admitting: Gastroenterology

## 2018-09-02 DIAGNOSIS — R1030 Lower abdominal pain, unspecified: Secondary | ICD-10-CM | POA: Diagnosis not present

## 2018-09-02 DIAGNOSIS — K279 Peptic ulcer, site unspecified, unspecified as acute or chronic, without hemorrhage or perforation: Secondary | ICD-10-CM

## 2018-09-02 NOTE — Assessment & Plan Note (Signed)
CLINICALLY IMPROVED ON PRN OMEPRAZOLE.  CONTINUE TO MONITOR SYMPTOMS. AVOID ASA/NSAIDS DUE TO RISK OF GI BLEED, PUD, AND ESRD. PT VOICED HER UNDERSTANDING. FOLLOW UP IN 6 MOS.

## 2018-09-02 NOTE — Assessment & Plan Note (Signed)
CLINICALLY IMPROVED. SYMPTOMS FAIRLY WELL CONTROLLED.  CONTINUE TO MONITOR SYMPTOMS. CALL WITH QUESTIONS OR CONCERNS. USE TYLENOL OR OXYCODONE 1/2 TO REDUCE PAIN AND PREVENT CONFUSION. PT VOICED HER UNDERSTANDING. FOLLOW UP IN 6 MOS.

## 2018-09-02 NOTE — Progress Notes (Signed)
Subjective:    Patient ID: Kimberly [name], female    DOB: 1927-04-20, 85 y.o.   MRN: [id number]   Primary Care Physician:  Wyatt Haste, NP  Primary GI:  Barney Drain, MD   Patient Location: home   Provider Location: [facility] office   Reason for Visit: LOWER ABDOMINAL PAIN   Persons present on the virtual encounter, with roles: patient, myself (provider), MARTINA BOOTH CMA (update meds/allergies)   Total time (minutes) spent on medical discussion:   10 MINUTES   Due to COVID-19, visit was VIA TELEPHONE VISIT DUE TO COVID 19. VISIT IS CONDUCTED VIRTUALLY AND WAS REQUESTED BY PATIENT.   Virtual Visit via TELEPHONE   I connected with Nashali L. Mcconnon  and verified that I am speaking with the correct person using two identifiers.   I discussed the limitations, risks, security and privacy concerns of performing an evaluation and management service by telephone/video and the availability of in person appointments. I also discussed with the patient that there may be a patient responsible charge related to this service. The patient expressed understanding and agreed to proceed.    HPI ALWAYS HAS PAIN. APPETITE: PRETTY GOOD. WEIGHT: NON FOR PAST 2 DAYS LAST TIM IT WAS 150 lBS DOESN'T THINK PT MADE ANY DIFFERENCE. BMs: JUST ABOUT EVERY DAY(NL, RARE CONSTIPATION AND DRINKS PRUNE JUICE TO HAVE A BM). PAIN IN RLQ-NOTICES TWINGES EVERY DAY, NO TRIGGERS, BETTER WITH TIME, NEVER TRIED HEATING PAD. SOMETIMES FEELS HEARTBURN: RANDOM, NO TRIGGERS, 1X/WEEK OR SO. NO ASPIRIN, BC/GOODY POWDERS, IBUPROFEN/MOTRIN, OR NAPROXEN/ALEVE.  PT DENIES FEVER, CHILLS, HEMATOCHEZIA, HEMATEMESIS, nausea, vomiting, melena, diarrhea, CHEST PAIN, SHORTNESS OF BREATH,CHANGE IN BOWEL IN HABITS, problems swallowing, OR heartburn or indigestion.  Past Medical History:  Diagnosis Date  . Acute blood loss anemia 10/11/2012  . Acute diverticulitis 08/24/2013  . Acute on chronic combined systolic and diastolic CHF, NYHA class 4  ([place]) 11/15/2013  . Antral ulcer 10/11/2012  . Arrhythmia    atrial fibb  . Atrial fibrillation ([place])   . Cardiomyopathy, nonischemic ([place])   . Chronic anticoagulation 10/12/2012  . CKD (chronic kidney disease) stage 3, GFR 30-59 ml/min (HCC) 10/12/2012  . Depression   . Erosive esophagitis 10/11/2012  . Fibromyalgia   . Glaucoma   . H/O echocardiogram 2007   EF 40-45%,         . Hypertension   . Osteoarthritis   . Pacemaker    Last saw cards 07/2013  . Scoliosis     Past Surgical History:  Procedure Laterality Date  . ABDOMINAL HYSTERECTOMY    . APPENDECTOMY    . BACK SURGERY    . BIOPSY  03/03/2017   Procedure: BIOPSY;  Surgeon: Danie Binder, MD;  Location[facility];  Service: Endoscopy;;  gastric  . BREAST SURGERY    . CARDIAC CATHETERIZATION  12/08/2005   LAD AND LEFT MAIN WITH NO HIGH-GRADE STENOSIS. MILD DISEASE IN THE CX AND LAD SYSTEM. SEVERE LV DYSFUNCTION WITH DILATION OF THE LV. EF 15-20%. LV END-DIASTOLIC PRESSURE IS 90. +1 MR.  . CHOLECYSTECTOMY    . COLONOSCOPY N/A 03/03/2017   Procedure: COLONOSCOPY;  Surgeon: Danie Binder, MD;  Location: [facility];  Service: Endoscopy;  Laterality: N/A;  . CYSTOSCOPY N/A 02/24/2013   Procedure: CYSTOSCOPY WITH URETHRAL DILITATION;  Surgeon: Marissa Nestle, MD;  Location: [facility] ORS;  Service: Urology;  Laterality: N/A;  . DOPPLER ECHOCARDIOGRAPHY N/A 05/30/2010   LV SIZE IS NORMAL. LV SYSTOLIC FUNCTION IS  LOW NORMAL. EF=50-55%. MILD INFERIOR HYPOKINESIS.MILD TO MODERATE POSTERIOR WALL HYPOKINESIS.PACEMAKER LEAD IN THE RV. LA IS MILDLY DILATED. RA IS MODERATE TO SEVERLY DILATED. PACEMAKER LEAD IN THE RA. MILD CALCICICATION OF THE MV APPARATUS. MODERATE MR. MILD TO MODERATE TR. MILD PHTN.AV MILDLY SCLEROTIC.  [name] ESOPHAGOGASTRODUODENOSCOPY N/A 10/13/2012   Dr. Gala Romney: severe ulcerative reflux esophagitis, question of Barrett's but negative path, single deep prepyloric antral ulcer, negative H.pylori  . ESOPHAGOGASTRODUODENOSCOPY  N/A 03/03/2017   Procedure: ESOPHAGOGASTRODUODENOSCOPY (EGD);  Surgeon: Danie Binder, MD;  [facility];  Service: Endoscopy;  Laterality: N/A;  . HERNIA REPAIR     right inguinal hernia and umbilical  . LOWETR EXT VENOUS Bilateral 11-08-10   R & L- NO EVIDENCE OF THROMBUS OR THROMBOPHLEBITIS. THERE IS MILD AMOUNT OF SUBCUTANEOUS EDEMA NOTED WITHIN THE LEFT CALF AND ANKLE. R & L GSV AND SSV- NO VENOUS INSUFF NOTED.  [name] NECK SURGERY    . NUCLEAR STRESS TEST N/A 02/13/2009   NORMAL PATTERN OF PERFUSION IN ALL REGIONS. POST STRESS VENTICULAR SIZE IS NORMAL. POST  STESS EF 85%.  NORMAL MYOCARDIAL PERFUSION STUDY.  [name] PACEMAKER INSERTION    . POLYPECTOMY  03/03/2017   Procedure: POLYPECTOMY;  Surgeon: Danie Binder, MD;  [facility];  Service: Endoscopy;;  colon  . TONSILLECTOMY    . YAG LASER APPLICATION Bilateral 11/10/6281   Procedure: YAG LASER APPLICATION;  Surgeon: Williams Che, MD;  Location: [facility];  Service: Ophthalmology;  Laterality: Bilateral;    Allergies  Allergen Reactions  . Ciprofloxacin Other (See Comments)    Possibly caused diarrhea November 2018  . Flagyl [Metronidazole] Other (See Comments)    Possibly caused diarrhea November 2018  . Penicillins Hives      . Sulfa Antibiotics Rash   Current Outpatient Medications  Medication Sig    . acetaminophen (TYLENOL) 500 MG tablet Take 1,000 mg by mouth every 6 (six) hours as needed for headache (pain).    [name] apixaban (ELIQUIS) 2.5 MG TABS tablet Take 1 tablet (2.5 mg total) by mouth 2 (two) times daily.    . carvedilol (COREG) 6.25 MG tablet Take 1 tablet (6.25 mg total) by mouth 2 (two) times daily with a meal.    . furosemide (LASIX) 20 MG tablet Take 1 tablet (20 mg total) by mouth daily.    . mirtazapine (REMERON) 30 MG tablet Take 1 tablet (30 mg total) by mouth at bedtime.    . Multiple Vitamins-Minerals (PRESERVISION AREDS 2) CAPS Take 1 capsule by mouth 2 (two) times daily.     [name]  omeprazole (PRILOSEC OTC) 20 MG tablet Take 20 mg by mouth daily as needed (acid reflux).    [name] OVER THE COUNTER MEDICATION Take 1 capsule by mouth 2 (two) times daily. Omega XL    . oxyCODONE (ROXICODONE) 5 MG immediate release tablet 1/2-1 PO QHS PRN ABDOMINAL PAIN    . PARoxetine (PAXIL) 20 MG tablet Take 1 tablet (20 mg total) by mouth daily.    Vladimir Faster Glycol-Propyl Glycol (SYSTANE ULTRA) 0.4-0.3 % SOLN Place 1-2 drops 2 (two) times daily as needed into both eyes (dry eyes).     . potassium chloride SA (K-DUR,KLOR-CON) 20 MEQ tablet Take 1 tablet (20 mEq total) by mouth daily.    . sodium chloride (OCEAN) 0.65 % SOLN nasal spray Place 1 spray 2 (two) times daily as needed into both nostrils for congestion.     . traZODone (DESYREL) 50 MG tablet Take  50-150 mg by mouth at bedtime as needed for sleep.     Review of Systems PER HPI OTHERWISE ALL SYSTEMS ARE NEGATIVE.     Objective:   Physical Exam  TELEPHONE VISIT DUE TO COVID 19, VISIT IS CONDUCTED VIRTUALLY AND WAS REQUESTED BY PATIENT.     Assessment & Plan:

## 2018-09-06 NOTE — Progress Notes (Signed)
ON RECALL  °

## 2018-09-06 NOTE — Progress Notes (Signed)
CC'D TO PCP °

## 2018-09-15 DIAGNOSIS — Z882 Allergy status to sulfonamides status: Secondary | ICD-10-CM | POA: Diagnosis not present

## 2018-09-15 DIAGNOSIS — I4891 Unspecified atrial fibrillation: Secondary | ICD-10-CM | POA: Diagnosis not present

## 2018-09-15 DIAGNOSIS — Z91041 Radiographic dye allergy status: Secondary | ICD-10-CM | POA: Diagnosis not present

## 2018-09-15 DIAGNOSIS — Z883 Allergy status to other anti-infective agents status: Secondary | ICD-10-CM | POA: Diagnosis not present

## 2018-09-15 DIAGNOSIS — R109 Unspecified abdominal pain: Secondary | ICD-10-CM | POA: Diagnosis not present

## 2018-09-15 DIAGNOSIS — R1084 Generalized abdominal pain: Secondary | ICD-10-CM | POA: Diagnosis not present

## 2018-09-15 DIAGNOSIS — R1011 Right upper quadrant pain: Secondary | ICD-10-CM | POA: Diagnosis not present

## 2018-09-15 DIAGNOSIS — Z79899 Other long term (current) drug therapy: Secondary | ICD-10-CM | POA: Diagnosis not present

## 2018-09-15 DIAGNOSIS — Z880 Allergy status to penicillin: Secondary | ICD-10-CM | POA: Diagnosis not present

## 2018-09-15 DIAGNOSIS — I10 Essential (primary) hypertension: Secondary | ICD-10-CM | POA: Diagnosis not present

## 2018-09-15 DIAGNOSIS — M810 Age-related osteoporosis without current pathological fracture: Secondary | ICD-10-CM | POA: Diagnosis not present

## 2018-09-15 DIAGNOSIS — G8929 Other chronic pain: Secondary | ICD-10-CM | POA: Diagnosis not present

## 2018-09-15 DIAGNOSIS — Z7901 Long term (current) use of anticoagulants: Secondary | ICD-10-CM | POA: Diagnosis not present

## 2018-09-15 DIAGNOSIS — Z87891 Personal history of nicotine dependence: Secondary | ICD-10-CM | POA: Diagnosis not present

## 2018-09-16 ENCOUNTER — Telehealth: Payer: Self-pay | Admitting: Family Medicine

## 2018-09-16 MED ORDER — QUETIAPINE FUMARATE 100 MG PO TABS: 100.0000 mg | ORAL_TABLET | Freq: Every day | ORAL | 3 refills | Status: DC

## 2018-09-16 NOTE — Addendum Note (Signed)
Addended by: Caryl Pina on: 09/16/2018 01:11 PM   Modules accepted: Orders

## 2018-09-16 NOTE — Telephone Encounter (Signed)
Please let the patient know that I sent Seroquel and she can try that and let me know, have her stop the trazodone

## 2018-09-16 NOTE — Telephone Encounter (Signed)
Patient aware.

## 2018-09-16 NOTE — Telephone Encounter (Signed)
Patient states that she is still having trouble sleeping with trazodone and would like to change medication

## 2018-09-17 ENCOUNTER — Ambulatory Visit: Payer: Medicare Other | Admitting: Family Medicine

## 2018-09-21 ENCOUNTER — Telehealth: Payer: Self-pay | Admitting: Cardiovascular Disease

## 2018-09-21 NOTE — Telephone Encounter (Signed)
New message    Pt c/o medication issue:  1. Name of Medication: QUEtiapine (SEROQUEL) 100 MG tablet  2. How are you currently taking this medication (dosage and times per day)? Take as needed   3. Are you having a reaction (difficulty breathing--STAT)? No   4. What is your medication issue?patient states that there is a lot side effects with this medication. Please call to discuss.

## 2018-09-21 NOTE — Telephone Encounter (Signed)
Spoke with pt who state she was prescribed Seroquel by her pcp to help her sleep. Pt states after reading the side effects and with her Cardiac hx, she is inquiring if Dr. Loletha Grayer is ok with her taking it. If not, she would like to know what he would recommend. She report she use to take Temazepam and it worked well, but her pcp will longer prescribe it and she doesn't understand why. Will route to MD

## 2018-09-22 NOTE — Telephone Encounter (Signed)
Call returned to the patient. She has decided to not take the Seroquel and has an appointment with a new PCP. She will call with an update if needed.

## 2018-09-22 NOTE — Telephone Encounter (Signed)
Follow Up:   Pt calling to find out about what to do about her medicine.

## 2018-09-22 NOTE — Telephone Encounter (Signed)
I suspect that her PCP stopped the temazepam since it is a habit forming drug. Seroquel can indeed have heart side effects, but since her heart function has improved so much, her risk of rhythm problems is lower than in the past. We usually recommend an ECG 5 days after starting the seroquel to look for warning signs (we measure the QT interval). I am not sure how to organize that with the coronavirus restrictions. If her PCP can do an [facility] in their office, I would be glad to review it.

## 2018-09-24 ENCOUNTER — Telehealth: Payer: Self-pay | Admitting: Family Medicine

## 2018-09-24 NOTE — Telephone Encounter (Signed)
Patient aware.

## 2018-09-24 NOTE — Telephone Encounter (Signed)
Please contact the patient . The last time she had the temazepam was 4 years ago, so I cannot renew for her. Dr. Lajuana Ripple will be back Tuesday. She can use tylenol PM at bedtime to help with sleep in the meantime. WS

## 2018-09-28 NOTE — Telephone Encounter (Signed)
Has appt tomorrow. Will discuss then

## 2018-09-29 ENCOUNTER — Other Ambulatory Visit: Payer: Self-pay

## 2018-09-29 ENCOUNTER — Ambulatory Visit (INDEPENDENT_AMBULATORY_CARE_PROVIDER_SITE_OTHER): Payer: Medicare Other | Admitting: Family Medicine

## 2018-09-29 ENCOUNTER — Ambulatory Visit: Payer: Self-pay | Admitting: Licensed Clinical Social Worker

## 2018-09-29 DIAGNOSIS — F331 Major depressive disorder, recurrent, moderate: Secondary | ICD-10-CM

## 2018-09-29 DIAGNOSIS — Z7409 Other reduced mobility: Secondary | ICD-10-CM

## 2018-09-29 DIAGNOSIS — K219 Gastro-esophageal reflux disease without esophagitis: Secondary | ICD-10-CM

## 2018-09-29 DIAGNOSIS — I4821 Permanent atrial fibrillation: Secondary | ICD-10-CM | POA: Diagnosis not present

## 2018-09-29 DIAGNOSIS — F3178 Bipolar disorder, in full remission, most recent episode mixed: Secondary | ICD-10-CM

## 2018-09-29 DIAGNOSIS — K279 Peptic ulcer, site unspecified, unspecified as acute or chronic, without hemorrhage or perforation: Secondary | ICD-10-CM | POA: Diagnosis not present

## 2018-09-29 DIAGNOSIS — I10 Essential (primary) hypertension: Secondary | ICD-10-CM

## 2018-09-29 DIAGNOSIS — Z789 Other specified health status: Secondary | ICD-10-CM

## 2018-09-29 MED ORDER — TRAZODONE HCL 50 MG PO TABS: 150.0000 mg | ORAL_TABLET | Freq: Every evening | ORAL | 3 refills | Status: DC | PRN

## 2018-09-29 MED ORDER — PANTOPRAZOLE SODIUM 40 MG PO TBEC: 40.0000 mg | DELAYED_RELEASE_TABLET | Freq: Every day | ORAL | 3 refills | Status: DC

## 2018-09-29 MED ORDER — QUETIAPINE FUMARATE 100 MG PO TABS: 50.0000 mg | ORAL_TABLET | Freq: Every day | ORAL | 3 refills | Status: DC

## 2018-09-29 NOTE — Patient Instructions (Addendum)
Licensed Clinical Social Worker Visit Information  Materials provided: No  Kimberly [name] was given information about Chronic Care Management services today including:  1. CCM service includes personalized support from designated clinical staff supervised by her physician, including individualized plan of care and coordination with other care providers 2. 24/7 contact phone numbers for assistance for urgent and routine care needs. 3. Service will only be billed when office clinical staff spend 20 minutes or more in a month to coordinate care. 4. Only one practitioner may furnish and bill the service in a calendar month. 5. The patient may stop CCM services at any time (effective at the end of the month) by phone call to the office staff. 6. The patient will be responsible for cost sharing (co-pay) of up to 20% of the service fee (after annual deductible is met).  Patient did not agree to services and wishes to consider information provided before deciding about enrollment in CCM services.  Client did agree for [facility] to call her to talk further with her about CCM program services  Follow up Plan:  [name] to call client in next 2 weeks to talk further with client about CCM program services.   Client was very communicative. Client has some transport needs. She has reported to PCP that she needs occasional help in the home.She has history of mental health diagnoses (Major Depressive Disorder, Bipolar Disorder) She has talked with Dr. Lajuana Ripple recently about client use of Seroquel.  Client could also be at some risk for social isolation.  Her children live out of the local area; client also has a neighbor listed as a contact for client. Client said she needed help in cleaning her home.  She said she has no problem with meal preparation. She said she does well with bathing and does well with ADLs . She said she has a walk in tub.This walk in tub is very helpful to client. Client uses a walker to help her  ambulate. She has her prescribed medications and is taking medications as prescribed. She said she likes to be independent and do things on her own. She does not want to go to Assisted Living facility. She said her son lives in [place]. Her daughter lives in [place] and travels a good deal. Client said she has a pacemaker and sees a cardiologist in [place], [name]. She agreed for [facility] to call her to talk further about CCM program services.She said she is interested in program support and would like to talk with RNCM about CCM program  The patient verbalized understanding of instructions provided today and declined a print copy of patient instruction materials.   [name].Kimberly [name] MSW, [name] Licensed Clinical Social Worker [facility] Care Management [phone]

## 2018-09-29 NOTE — Progress Notes (Signed)
Telephone visit  Subjective: CC: est care with new provider, Af[facility] PCP: [name], DO [id number] L Kimberly [name] is a 85 y.o. female calls for telephone consult today. Patient provides verbal consent for consult held via phone.  Location of patient: home Location of provider: [facility] Others present for call: none  1. PUD Patient with longstanding history of GERD and PUD.  She has history of GI bleed in the past.  She denies use of PPI and reports dependence on over-the-counter generic Tums for acid control.  She does report that acid is not controlled with this.  Denies any melena or hematochezia.  No nausea or vomiting.  Tolerating p.o. intake without difficulty.  2.  Bipolar disorder/depression Patient with longstanding history of mental health issues.  She notes that she has been treated with mirtazapine, Paxil, temazepam, trazodone and Seroquel.  Most recently Seroquel was added at 100 mg daily but she reports that she cannot take it because it "makes her feel weird".  She is wondering if there is something else that she can take.  She did feel like her sleep was good with temazepam.  She is at high risk of falls and bleeding.  She does feel that the trazodone also "makes her feel funny".  She was prescribed 150 mg daily but notes that she has not been taking this medicine either.  No SI or HI.  3.  A. fib Patient reports compliance with Coreg and Eliquis.  Denies any chest pain, shortness of breath, dizziness or falls.  No bleeding episodes.  No hematochezia or melena.  4.  Impaired mobility Patient reports impaired mobility and difficulty getting to appointments.  She is dependent upon others for transportation and is aware of RCATs.  She had a health aide but notes that the health aide was stealing from her and therefore she let her go.  She would like to have somebody help her with grocery shopping and chores as her arthritis severely impairs her ability to get around.   ROS: Per  HPI  Allergies  Allergen Reactions  . Ciprofloxacin Other (See Comments)    Possibly caused diarrhea November 2018  . Flagyl [Metronidazole] Other (See Comments)    Possibly caused diarrhea November 2018  . Penicillins Hives    DID THE REACTION INVOLVE: Swelling of the face/tongue/throat, SOB, or low BP? Unknown Sudden or severe rash/hives, skin peeling, or the inside of the mouth or nose? Yes Did it require medical treatment? Yes When did it last happen?61 or 85 years old If all above answers are "NO", may proceed with cephalosporin use.  . Sulfa Antibiotics Rash   Past Medical History:  Diagnosis Date  . Acute blood loss anemia 10/11/2012  . Acute diverticulitis 08/24/2013  . Acute on chronic combined systolic and diastolic CHF, NYHA class 4 ([place]) 11/15/2013  . Antral ulcer 10/11/2012  . Arrhythmia    atrial fibb  . Atrial fibrillation ([place])   . Cardiomyopathy, nonischemic ([place])   . Chronic anticoagulation 10/12/2012  . CKD (chronic kidney disease) stage 3, GFR 30-59 ml/min (HCC) 10/12/2012  . Depression   . Erosive esophagitis 10/11/2012  . Fibromyalgia   . Glaucoma   . H/O echocardiogram 2007   EF 40-45%,         . Hypertension   . Osteoarthritis   . Pacemaker    Last saw cards 07/2013  . Scoliosis     Current Outpatient Medications:  .  acetaminophen (TYLENOL) 500 MG tablet, Take 1,000 mg by  mouth every 6 (six) hours as needed for headache (pain)., Disp: , Rfl:  .  apixaban (ELIQUIS) 2.5 MG TABS tablet, Take 1 tablet (2.5 mg total) by mouth 2 (two) times daily., Disp: 180 tablet, Rfl: 1 .  carvedilol (COREG) 6.25 MG tablet, Take 1 tablet (6.25 mg total) by mouth 2 (two) times daily with a meal., Disp: 60 tablet, Rfl: 11 .  furosemide (LASIX) 20 MG tablet, Take 1 tablet (20 mg total) by mouth daily., Disp: 90 tablet, Rfl: 3 .  mirtazapine (REMERON) 30 MG tablet, Take 1 tablet (30 mg total) by mouth at bedtime., Disp: 30 tablet, Rfl: 1 .  Multiple Vitamins-Minerals  (PRESERVISION AREDS 2) CAPS, Take 1 capsule by mouth 2 (two) times daily. , Disp: , Rfl:  .  omeprazole (PRILOSEC OTC) 20 MG tablet, Take 20 mg by mouth daily as needed (acid reflux)., Disp: , Rfl:  .  OVER THE COUNTER MEDICATION, Take 1 capsule by mouth 2 (two) times daily. Omega XL, Disp: , Rfl:  .  oxyCODONE (ROXICODONE) 5 MG immediate release tablet, 1/2-1 PO QHS PRN ABDOMINAL PAIN, Disp: 30 tablet, Rfl: 0 .  PARoxetine (PAXIL) 20 MG tablet, Take 1 tablet (20 mg total) by mouth daily., Disp: 90 tablet, Rfl: 0 .  Polyethyl Glycol-Propyl Glycol (SYSTANE ULTRA) 0.4-0.3 % SOLN, Place 1-2 drops 2 (two) times daily as needed into both eyes (dry eyes). , Disp: , Rfl:  .  potassium chloride SA (K-DUR,KLOR-CON) 20 MEQ tablet, Take 1 tablet (20 mEq total) by mouth daily., Disp: 30 tablet, Rfl: 2 .  QUEtiapine (SEROQUEL) 100 MG tablet, Take 1 tablet (100 mg total) by mouth at bedtime., Disp: 30 tablet, Rfl: 3 .  sodium chloride (OCEAN) 0.65 % SOLN nasal spray, Place 1 spray 2 (two) times daily as needed into both nostrils for congestion. , Disp: , Rfl:   Assessment/ Plan: 85 y.o. female   1. Permanent atrial fibrillation Compliant with medications. No bleeding or red flag symptoms. - Referral to Chronic Care Management Services - Referral to Chronic Care Management Services  2. PUD (peptic ulcer disease) Start PPI. Protonix rx'd.  Advised to take separately from anticoagulant. - pantoprazole (PROTONIX) 40 MG tablet; Take 1 tablet (40 mg total) by mouth daily.  Dispense: 30 tablet; Refill: 3 - Referral to Chronic Care Management Services - Referral to Chronic Care Management Services  3. Moderate episode of recurrent major depressive disorder (HCC) Having side effects from 100mg  dose of Seroquel. Advised to cut back to 50mg  to see if this helps.  Offered referral to psychiatry, she declined.  - QUEtiapine (SEROQUEL) 100 MG tablet; Take 0.5 tablets (50 mg total) by mouth at bedtime.  Dispense: 30  tablet; Refill: 3 - traZODone (DESYREL) 50 MG tablet; Take 3 tablets (150 mg total) by mouth at bedtime as needed for sleep.  Dispense: 30 tablet; Refill: 3 - Referral to Chronic Care Management Services - Referral to Chronic Care Management Services  4. Bipolar disorder, in full remission, most recent episode mixed ([place]) I question whether or not this is controlled.  Again, we reduced the Seroquel to 50 mg daily.  This is a high risk medication, particularly given patient's history of arrhythmia and age but at this time I do feel that the benefit of the medication outweighs the risks.  I will also place referral to CCM social worker as patient would benefit from counseling as well. - QUEtiapine (SEROQUEL) 100 MG tablet; Take 0.5 tablets (50 mg total) by mouth at  bedtime.  Dispense: 30 tablet; Refill: 3 - traZODone (DESYREL) 50 MG tablet; Take 3 tablets (150 mg total) by mouth at bedtime as needed for sleep.  Dispense: 30 tablet; Refill: 3 - Referral to Chronic Care Management Services - Referral to Chronic Care Management Services  5. Impaired mobility and activities of daily living Desires assistance at home as she has limited mobility.  I will place a referral to CCM and see if perhaps they might be able to assist with this.  Patient also needs assistance with transportation. - Referral to Chronic Care Management Services - Referral to Chronic Care Management Services   Start time: 8:55am End time: 9:18am  Total time spent on patient care (including telephone call/ virtual visit): 27 minutes  [place], [place] [phone]

## 2018-09-29 NOTE — Chronic Care Management (AMB) (Signed)
  Care Management Note   Kimberly [name] is a 85 y.o. year old female who is a primary care patient of [name], DO. The CM team was consulted for assistance with chronic disease management and care coordination.   I reached out to [name] by phone today.   Kimberly [name] was given information about Chronic Care Management services today including:  1. CCM service includes personalized support from designated clinical staff supervised by Kimberly [name] physician, including individualized plan of care and coordination with other care providers 2. 24/7 contact phone numbers for assistance for urgent and routine care needs. 3. Service will only be billed when office clinical staff spend 20 minutes or more in a month to coordinate care. 4. Only one practitioner may furnish and bill the service in a calendar month. 5. The patient may stop CCM services at any time (effective at the end of the month) by phone call to the office staff. 6. The patient will be responsible for cost sharing (co-pay) of up to 20% of the service fee (after annual deductible is met).  Patient did not agree to services and wishes to consider information provided before deciding about enrollment in CCM services. Kimberly [name] did agree for [facility] to call Kimberly [name] to talk further with Kimberly [name] about CCM program services.    Kimberly [name] was very communicative. Kimberly [name] has some transport needs. She has reported to PCP that she needs occasional help in the home.She has history of mental health diagnoses (Major Depressive Disorder, Bipolar Disorder) She has talked with Dr. Lajuana Ripple recently about Kimberly [name] use of Seroquel.  Kimberly [name] could also be at some risk for social isolation.  Kimberly [name] children live out of the local area; Kimberly [name] also has a neighbor listed as a contact for Kimberly [name]. Kimberly [name] said she needed help in cleaning Kimberly [name] home.  She said she has no problem with meal preparation. She said she does well with bathing and does well with ADLs . She said she has a walk in  tub.This walk in tub is very helpful to Kimberly [name]. Kimberly [name] uses a walker to help Kimberly [name] ambulate. She has Kimberly [name] prescribed medications and is taking medications as prescribed. She said she likes to be independent and do things on Kimberly [name] own. She does not want to go to Assisted Living facility. She said Kimberly [name] son lives in [place]. Kimberly [name] daughter lives in [place] and travels a good deal. Kimberly [name] said she has a pacemaker and sees a cardiologist in [place], [name]. She agreed for [facility] to call Kimberly [name] to talk further about CCM program services.She said she is interested in program support and would like to talk with RNCM about CCM program  Follow Up Plan: [name] to call Kimberly [name] in next 2 weeks to talk with Kimberly [name] further about CCM program services.  Kimberly [name]. MSW, [name] Licensed Clinical Social Worker [facility] Care [facility] [phone]

## 2018-09-30 ENCOUNTER — Ambulatory Visit: Payer: Medicare Other | Admitting: Family Medicine

## 2018-09-30 ENCOUNTER — Telehealth: Payer: Self-pay | Admitting: Family Medicine

## 2018-09-30 NOTE — Telephone Encounter (Signed)
Aware.  Degenerative changes in spine and scoliosis.  Try gentle movements to exercise and she plans to get a memory foam mattress to help with pain.

## 2018-10-01 ENCOUNTER — Telehealth: Payer: Self-pay | Admitting: Cardiovascular Disease

## 2018-10-01 NOTE — Telephone Encounter (Signed)
Bruising has been there for about a 1 week- between knee and ankle, size of a quarter. I advised patient to monitor the bruise color and size due to the medication- patient states no redness, or extreme pain.  Patient does say that her left leg had to have staples in it due to hitting it on New Years Eve and it is still healing.  patient denies weight gain-no changes in diet, she does watch her salt intake at home, patient does elevate her legs every evening. I suggested compression stockings but she states her left leg is so sore- she is unable to wear anything on them at this time.   I suggested that patient continue to elevate her legs while sitting and continue to limit the salt intake. I advised I would route to MD for any other recommendations at this time. No other issues other than bruise and swelling in ankles.

## 2018-10-01 NOTE — Telephone Encounter (Signed)
New Message    Pt says she has some bruising on both of her legs that are lasting a long time, she says her ankles also have a lot of swelling    Please call back

## 2018-10-02 ENCOUNTER — Telehealth: Payer: Self-pay | Admitting: Physician Assistant

## 2018-10-02 NOTE — Telephone Encounter (Signed)
Ty! MCr 

## 2018-10-02 NOTE — Telephone Encounter (Signed)
Patient call. She has a long list of complaints.  Patient describes abdominal pain, tingling in hands and feet, and joint pain. She thinks these symptoms are due to two medications that PCP prescribed on 09/29/18. She has sinced stopped these medications. She reports feeling poorly due to the weather.  She also describes rapid heart beat and trouble breathing; headache (never gets headache). Ankles are swollen and she reports DOE.   New medications. Protonix - only took a couple because she didn't like the way they made her feel, she stopped.  Seroquel - also started on 09/29/18, now stopped after she read the side effects on the insert.    It sounds like she is mildly volume overloaded. I instructed her to take extra 20 mg lasix today and rest. We discussed when to come to the ER and when to call us back. She expressed understanding of the plan. We also discussed salt substitutes, she has been eating salt on food.  I asked her to call her PCP for the above noncardiac problems.   Tami Lin Trexton Escamilla, PA-C 10/02/2018, 9:25 AM

## 2018-10-02 NOTE — Telephone Encounter (Signed)
Paged with questions for medications. Returned patient call - left a voicemail.    Tami Lin Lori Popowski, PA-C 10/02/2018, 9:14 AM

## 2018-10-04 ENCOUNTER — Ambulatory Visit: Payer: Medicare Other | Admitting: *Deleted

## 2018-10-04 DIAGNOSIS — I4821 Permanent atrial fibrillation: Secondary | ICD-10-CM

## 2018-10-04 DIAGNOSIS — K279 Peptic ulcer, site unspecified, unspecified as acute or chronic, without hemorrhage or perforation: Secondary | ICD-10-CM

## 2018-10-04 DIAGNOSIS — I5032 Chronic diastolic (congestive) heart failure: Secondary | ICD-10-CM

## 2018-10-04 MED ORDER — OMEPRAZOLE MAGNESIUM 20 MG PO TBEC: 20.0000 mg | DELAYED_RELEASE_TABLET | Freq: Every day | ORAL | 0 refills | Status: DC

## 2018-10-04 NOTE — Telephone Encounter (Signed)
Agree with advice MCr

## 2018-10-06 ENCOUNTER — Telehealth: Payer: Self-pay | Admitting: Cardiovascular Disease

## 2018-10-06 NOTE — Telephone Encounter (Signed)
Call returned to the patient. She stated that for the last five days her ankles and feet have been swelling bilaterally. The swelling does get better at night and progresses throughout the day. She denies an increase in shortness of breath.   She stated that she does elevate her feet as much as possible and currently takes furosemide 20 mg daily. She does not currently weigh daily. She does watch her salt intake and has not had a change in diet lately that would cause the fluid retention. She does not wear compression stockings due to a sore of her left leg that is trying to heal.   She has been advised to keep elevating her feet as much as possible and to try to weigh herself daily.

## 2018-10-06 NOTE — Telephone Encounter (Signed)
Follow up    Pt is calling to add to she is having some swelling    Pt c/o swelling: STAT is pt has developed SOB within 24 hours  1) How much weight have you gained and in what time span? She doesn't know she has not been weighed   2) If swelling, where is the swelling located? Swelling in both ankles   3) Are you currently taking a fluid pill? Yes   4) Are you currently SOB? She always has sob   5) Do you have a log of your daily weights (if so, list)? No   6) Have you gained 3 pounds in a day or 5 pounds in a week? Doesn't know   7) Have you traveled recently? No

## 2018-10-06 NOTE — Telephone Encounter (Signed)
New Message    Pt is calling and is using Lysol and is wondering if it will affect her any     Please call

## 2018-10-06 NOTE — Telephone Encounter (Signed)
No additional recommendations. Please call and let her know, in case she has additional questions.

## 2018-10-07 ENCOUNTER — Telehealth: Payer: Self-pay | Admitting: Physician Assistant

## 2018-10-07 ENCOUNTER — Telehealth: Payer: Self-pay | Admitting: Cardiovascular Disease

## 2018-10-07 ENCOUNTER — Emergency Department (HOSPITAL_COMMUNITY)
Admission: EM | Admit: 2018-10-07 | Discharge: 2018-10-07 | Discharge status: Against Medical Advice | Payer: Medicare Other

## 2018-10-07 ENCOUNTER — Telehealth: Payer: Self-pay | Admitting: Family Medicine

## 2018-10-07 DIAGNOSIS — M79605 Pain in left leg: Secondary | ICD-10-CM | POA: Diagnosis not present

## 2018-10-07 DIAGNOSIS — I959 Hypotension, unspecified: Secondary | ICD-10-CM | POA: Diagnosis not present

## 2018-10-07 DIAGNOSIS — R110 Nausea: Secondary | ICD-10-CM | POA: Diagnosis not present

## 2018-10-07 DIAGNOSIS — R51 Headache: Secondary | ICD-10-CM | POA: Diagnosis not present

## 2018-10-07 DIAGNOSIS — Z7401 Bed confinement status: Secondary | ICD-10-CM | POA: Diagnosis not present

## 2018-10-07 NOTE — Telephone Encounter (Signed)
Returned call to pt she states that she is sob and has gained 2# she took and took lasix 20mg  and an additional 20mg  today she will continue with additional lasix over the weekend, she will elevate and compress over the weekend and will call back Monday if this does not alleviate the swelling

## 2018-10-07 NOTE — ED Notes (Signed)
Patient left from triage waiting room without being screen when called for triage. Patient advised she was leaving.

## 2018-10-07 NOTE — Telephone Encounter (Signed)
  Pt c/o swelling: STAT is pt has developed SOB within 24 hours  1) How much weight have you gained and in what time span? Not sure  2) If swelling, where is the swelling located? Knees down  3) Are you currently taking a fluid pill? Yes, been taking 2 the last couple days  4) Are you currently SOB? She says she always has SOB  5) Do you have a log of your daily weights (if so, list)? no  6) Have you gained 3 pounds in a day or 5 pounds in a week? Not sure  7) Have you traveled recently? No  Patient is saying she is having a lot of swelling and that she is taking 2 of her fluid pills but they do not seem to be helping her.

## 2018-10-07 NOTE — Telephone Encounter (Signed)
Pt states that the Trazodone does not have at al - would really like temazepam  - pt would like you to consider it.   Only getting 2-3 hours each night [employer]

## 2018-10-07 NOTE — Telephone Encounter (Signed)
PT is wanting to help her sleep, her whole body aches, she only sleeps 2-3 hours a night. She is wanting to talk to Dr Darnell Level or her nurse about it.

## 2018-10-07 NOTE — Telephone Encounter (Signed)
I will not be prescribing temazepam to patient.  This was discussed previously .  I'd be glad to refer to psychiatry.  She is likely having manic episodes preventing sleep.

## 2018-10-07 NOTE — Telephone Encounter (Signed)
   The patient called the answering service after-hours today. She reports this is her third call in a week for ankle swelling. She was previously advised to take an extra dose of furosemide on 5/30 by [facility] but the patient did not. At that call she also had a multitude of other complaints as well. She does not know her weight or her blood pressure. She is also having trouble sleeping for unclear reasons to her. She is having some DOE but no chest pain. She says "everything is just all upside down." She has previously declined to go to ED. I told her to go ahead and take the extra dose of furosemide as previously instructed today and that I would send this message to Dr. Sallyanne Kuster for review. Given numerous phone encounters over the last week, likely needs a formal OV instead of having different on call providers temporarily adjust medicine without adequate f/u plan. I will route to Dr. Sallyanne Kuster to determine if he thinks this should be in person or virtual. If virtual visit, this might be someone who would benefit from a home health visit thereafter. Will also cc to triage to make them aware.  The patient verbalized understanding and gratitude.  Charlie Pitter

## 2018-10-08 ENCOUNTER — Telehealth: Payer: Self-pay | Admitting: Family Medicine

## 2018-10-08 NOTE — Telephone Encounter (Signed)
Spoke with pt, Follow up scheduled Wednesday 10-13-2018 due to transportation issues.

## 2018-10-08 NOTE — Telephone Encounter (Signed)
Pt notified Dr Darnell Level will not RX Temazepam Pt will try OTC

## 2018-10-08 NOTE — Telephone Encounter (Signed)
Patient has a follow up appointment on 10/13/2018

## 2018-10-08 NOTE — Telephone Encounter (Signed)
I do not see this was yet addressed but another phone note has since been opened for same issue - please schedule patient for appt to address. Likely needs in-person visit for labs as well Melina Copa PA-C

## 2018-10-08 NOTE — Telephone Encounter (Signed)
Pt.notified

## 2018-10-09 DIAGNOSIS — Z91041 Radiographic dye allergy status: Secondary | ICD-10-CM | POA: Diagnosis not present

## 2018-10-09 DIAGNOSIS — L03116 Cellulitis of left lower limb: Secondary | ICD-10-CM | POA: Diagnosis not present

## 2018-10-09 DIAGNOSIS — J439 Emphysema, unspecified: Secondary | ICD-10-CM | POA: Diagnosis not present

## 2018-10-09 DIAGNOSIS — M810 Age-related osteoporosis without current pathological fracture: Secondary | ICD-10-CM | POA: Diagnosis not present

## 2018-10-09 DIAGNOSIS — Z881 Allergy status to other antibiotic agents status: Secondary | ICD-10-CM | POA: Diagnosis not present

## 2018-10-09 DIAGNOSIS — R609 Edema, unspecified: Secondary | ICD-10-CM | POA: Diagnosis not present

## 2018-10-09 DIAGNOSIS — Z87891 Personal history of nicotine dependence: Secondary | ICD-10-CM | POA: Diagnosis not present

## 2018-10-09 DIAGNOSIS — I4891 Unspecified atrial fibrillation: Secondary | ICD-10-CM | POA: Diagnosis not present

## 2018-10-09 DIAGNOSIS — M79605 Pain in left leg: Secondary | ICD-10-CM | POA: Diagnosis not present

## 2018-10-09 DIAGNOSIS — I10 Essential (primary) hypertension: Secondary | ICD-10-CM | POA: Diagnosis not present

## 2018-10-09 DIAGNOSIS — Z880 Allergy status to penicillin: Secondary | ICD-10-CM | POA: Diagnosis not present

## 2018-10-09 DIAGNOSIS — I517 Cardiomegaly: Secondary | ICD-10-CM | POA: Diagnosis not present

## 2018-10-09 DIAGNOSIS — Z882 Allergy status to sulfonamides status: Secondary | ICD-10-CM | POA: Diagnosis not present

## 2018-10-09 DIAGNOSIS — Z7901 Long term (current) use of anticoagulants: Secondary | ICD-10-CM | POA: Diagnosis not present

## 2018-10-09 DIAGNOSIS — I700 Atherosclerosis of aorta: Secondary | ICD-10-CM | POA: Diagnosis not present

## 2018-10-09 DIAGNOSIS — J449 Chronic obstructive pulmonary disease, unspecified: Secondary | ICD-10-CM | POA: Diagnosis not present

## 2018-10-09 DIAGNOSIS — Z79899 Other long term (current) drug therapy: Secondary | ICD-10-CM | POA: Diagnosis not present

## 2018-10-10 DIAGNOSIS — Z7901 Long term (current) use of anticoagulants: Secondary | ICD-10-CM | POA: Diagnosis not present

## 2018-10-10 DIAGNOSIS — I4891 Unspecified atrial fibrillation: Secondary | ICD-10-CM | POA: Diagnosis not present

## 2018-10-10 DIAGNOSIS — I10 Essential (primary) hypertension: Secondary | ICD-10-CM | POA: Diagnosis not present

## 2018-10-10 DIAGNOSIS — M545 Low back pain: Secondary | ICD-10-CM | POA: Diagnosis not present

## 2018-10-10 DIAGNOSIS — S3993XA Unspecified injury of pelvis, initial encounter: Secondary | ICD-10-CM | POA: Diagnosis not present

## 2018-10-10 DIAGNOSIS — Z888 Allergy status to other drugs, medicaments and biological substances status: Secondary | ICD-10-CM | POA: Diagnosis not present

## 2018-10-10 DIAGNOSIS — Z882 Allergy status to sulfonamides status: Secondary | ICD-10-CM | POA: Diagnosis not present

## 2018-10-10 DIAGNOSIS — S3992XA Unspecified injury of lower back, initial encounter: Secondary | ICD-10-CM | POA: Diagnosis not present

## 2018-10-10 DIAGNOSIS — Z87891 Personal history of nicotine dependence: Secondary | ICD-10-CM | POA: Diagnosis not present

## 2018-10-10 DIAGNOSIS — T1490XA Injury, unspecified, initial encounter: Secondary | ICD-10-CM | POA: Diagnosis not present

## 2018-10-10 DIAGNOSIS — W19XXXA Unspecified fall, initial encounter: Secondary | ICD-10-CM | POA: Diagnosis not present

## 2018-10-10 DIAGNOSIS — Z79899 Other long term (current) drug therapy: Secondary | ICD-10-CM | POA: Diagnosis not present

## 2018-10-10 DIAGNOSIS — Z880 Allergy status to penicillin: Secondary | ICD-10-CM | POA: Diagnosis not present

## 2018-10-10 DIAGNOSIS — Z91041 Radiographic dye allergy status: Secondary | ICD-10-CM | POA: Diagnosis not present

## 2018-10-10 DIAGNOSIS — W07XXXA Fall from chair, initial encounter: Secondary | ICD-10-CM | POA: Diagnosis not present

## 2018-10-10 DIAGNOSIS — S300XXA Contusion of lower back and pelvis, initial encounter: Secondary | ICD-10-CM | POA: Diagnosis not present

## 2018-10-10 DIAGNOSIS — R5381 Other malaise: Secondary | ICD-10-CM | POA: Diagnosis not present

## 2018-10-11 ENCOUNTER — Telehealth: Payer: Self-pay | Admitting: Family Medicine

## 2018-10-11 NOTE — Telephone Encounter (Signed)
PT went to [facility] night before last and was given doxycycline she said that she is not going to take it because of side effects of interfering with eye sight?

## 2018-10-11 NOTE — Telephone Encounter (Signed)
Records requested

## 2018-10-11 NOTE — Telephone Encounter (Signed)
Please obtain the records from [facility], so I can see what they are treating her for.

## 2018-10-12 ENCOUNTER — Emergency Department (HOSPITAL_COMMUNITY): Payer: Medicare Other

## 2018-10-12 ENCOUNTER — Inpatient Hospital Stay (HOSPITAL_COMMUNITY)
Admission: EM | Admit: 2018-10-12 | Discharge: 2018-10-18 | DRG: 378 | Disposition: A | Discharge status: Home Health | Payer: Medicare Other | Attending: Family Medicine | Admitting: Family Medicine

## 2018-10-12 ENCOUNTER — Ambulatory Visit (INDEPENDENT_AMBULATORY_CARE_PROVIDER_SITE_OTHER): Payer: Medicare Other | Admitting: Family Medicine

## 2018-10-12 ENCOUNTER — Other Ambulatory Visit: Payer: Self-pay

## 2018-10-12 ENCOUNTER — Telehealth: Payer: Self-pay | Admitting: Family Medicine

## 2018-10-12 ENCOUNTER — Encounter (HOSPITAL_COMMUNITY): Payer: Self-pay

## 2018-10-12 DIAGNOSIS — Z950 Presence of cardiac pacemaker: Secondary | ICD-10-CM

## 2018-10-12 DIAGNOSIS — R410 Disorientation, unspecified: Secondary | ICD-10-CM | POA: Diagnosis present

## 2018-10-12 DIAGNOSIS — Z8711 Personal history of peptic ulcer disease: Secondary | ICD-10-CM

## 2018-10-12 DIAGNOSIS — L03116 Cellulitis of left lower limb: Secondary | ICD-10-CM | POA: Diagnosis not present

## 2018-10-12 DIAGNOSIS — N183 Chronic kidney disease, stage 3 unspecified: Secondary | ICD-10-CM

## 2018-10-12 DIAGNOSIS — R531 Weakness: Secondary | ICD-10-CM | POA: Diagnosis present

## 2018-10-12 DIAGNOSIS — Z91018 Allergy to other foods: Secondary | ICD-10-CM

## 2018-10-12 DIAGNOSIS — Z87891 Personal history of nicotine dependence: Secondary | ICD-10-CM

## 2018-10-12 DIAGNOSIS — Z1159 Encounter for screening for other viral diseases: Secondary | ICD-10-CM

## 2018-10-12 DIAGNOSIS — I10 Essential (primary) hypertension: Secondary | ICD-10-CM | POA: Diagnosis not present

## 2018-10-12 DIAGNOSIS — Z825 Family history of asthma and other chronic lower respiratory diseases: Secondary | ICD-10-CM

## 2018-10-12 DIAGNOSIS — Z8719 Personal history of other diseases of the digestive system: Secondary | ICD-10-CM

## 2018-10-12 DIAGNOSIS — F319 Bipolar disorder, unspecified: Secondary | ICD-10-CM | POA: Diagnosis present

## 2018-10-12 DIAGNOSIS — K5730 Diverticulosis of large intestine without perforation or abscess without bleeding: Secondary | ICD-10-CM | POA: Diagnosis not present

## 2018-10-12 DIAGNOSIS — R1030 Lower abdominal pain, unspecified: Secondary | ICD-10-CM

## 2018-10-12 DIAGNOSIS — H409 Unspecified glaucoma: Secondary | ICD-10-CM | POA: Diagnosis present

## 2018-10-12 DIAGNOSIS — I5032 Chronic diastolic (congestive) heart failure: Secondary | ICD-10-CM | POA: Diagnosis not present

## 2018-10-12 DIAGNOSIS — R1031 Right lower quadrant pain: Secondary | ICD-10-CM | POA: Diagnosis not present

## 2018-10-12 DIAGNOSIS — I4821 Permanent atrial fibrillation: Secondary | ICD-10-CM | POA: Diagnosis present

## 2018-10-12 DIAGNOSIS — I5042 Chronic combined systolic (congestive) and diastolic (congestive) heart failure: Secondary | ICD-10-CM | POA: Diagnosis not present

## 2018-10-12 DIAGNOSIS — K5733 Diverticulitis of large intestine without perforation or abscess with bleeding: Secondary | ICD-10-CM | POA: Diagnosis not present

## 2018-10-12 DIAGNOSIS — K621 Rectal polyp: Secondary | ICD-10-CM | POA: Diagnosis present

## 2018-10-12 DIAGNOSIS — Z7901 Long term (current) use of anticoagulants: Secondary | ICD-10-CM | POA: Diagnosis not present

## 2018-10-12 DIAGNOSIS — K5792 Diverticulitis of intestine, part unspecified, without perforation or abscess without bleeding: Secondary | ICD-10-CM | POA: Diagnosis not present

## 2018-10-12 DIAGNOSIS — K922 Gastrointestinal hemorrhage, unspecified: Secondary | ICD-10-CM

## 2018-10-12 DIAGNOSIS — M1990 Unspecified osteoarthritis, unspecified site: Secondary | ICD-10-CM | POA: Diagnosis present

## 2018-10-12 DIAGNOSIS — M419 Scoliosis, unspecified: Secondary | ICD-10-CM | POA: Diagnosis present

## 2018-10-12 DIAGNOSIS — K7689 Other specified diseases of liver: Secondary | ICD-10-CM | POA: Diagnosis not present

## 2018-10-12 DIAGNOSIS — R569 Unspecified convulsions: Secondary | ICD-10-CM | POA: Diagnosis present

## 2018-10-12 DIAGNOSIS — I130 Hypertensive heart and chronic kidney disease with heart failure and stage 1 through stage 4 chronic kidney disease, or unspecified chronic kidney disease: Secondary | ICD-10-CM | POA: Diagnosis not present

## 2018-10-12 DIAGNOSIS — Z8659 Personal history of other mental and behavioral disorders: Secondary | ICD-10-CM | POA: Diagnosis not present

## 2018-10-12 DIAGNOSIS — K648 Other hemorrhoids: Secondary | ICD-10-CM | POA: Diagnosis present

## 2018-10-12 DIAGNOSIS — K279 Peptic ulcer, site unspecified, unspecified as acute or chronic, without hemorrhage or perforation: Secondary | ICD-10-CM | POA: Diagnosis not present

## 2018-10-12 DIAGNOSIS — Z8249 Family history of ischemic heart disease and other diseases of the circulatory system: Secondary | ICD-10-CM

## 2018-10-12 DIAGNOSIS — I428 Other cardiomyopathies: Secondary | ICD-10-CM | POA: Diagnosis present

## 2018-10-12 DIAGNOSIS — D62 Acute posthemorrhagic anemia: Secondary | ICD-10-CM | POA: Diagnosis not present

## 2018-10-12 DIAGNOSIS — I5043 Acute on chronic combined systolic (congestive) and diastolic (congestive) heart failure: Secondary | ICD-10-CM

## 2018-10-12 DIAGNOSIS — Z79899 Other long term (current) drug therapy: Secondary | ICD-10-CM

## 2018-10-12 DIAGNOSIS — Z880 Allergy status to penicillin: Secondary | ICD-10-CM | POA: Diagnosis not present

## 2018-10-12 DIAGNOSIS — M797 Fibromyalgia: Secondary | ICD-10-CM | POA: Diagnosis not present

## 2018-10-12 DIAGNOSIS — F419 Anxiety disorder, unspecified: Secondary | ICD-10-CM | POA: Diagnosis present

## 2018-10-12 DIAGNOSIS — Z888 Allergy status to other drugs, medicaments and biological substances status: Secondary | ICD-10-CM

## 2018-10-12 DIAGNOSIS — Z03818 Encounter for observation for suspected exposure to other biological agents ruled out: Secondary | ICD-10-CM | POA: Diagnosis not present

## 2018-10-12 DIAGNOSIS — Z881 Allergy status to other antibiotic agents status: Secondary | ICD-10-CM

## 2018-10-12 DIAGNOSIS — I959 Hypotension, unspecified: Secondary | ICD-10-CM | POA: Diagnosis present

## 2018-10-12 DIAGNOSIS — Z882 Allergy status to sulfonamides status: Secondary | ICD-10-CM

## 2018-10-12 DIAGNOSIS — Z91041 Radiographic dye allergy status: Secondary | ICD-10-CM

## 2018-10-12 DIAGNOSIS — K5732 Diverticulitis of large intestine without perforation or abscess without bleeding: Secondary | ICD-10-CM | POA: Diagnosis not present

## 2018-10-12 DIAGNOSIS — K219 Gastro-esophageal reflux disease without esophagitis: Secondary | ICD-10-CM | POA: Diagnosis present

## 2018-10-12 DIAGNOSIS — I700 Atherosclerosis of aorta: Secondary | ICD-10-CM | POA: Diagnosis not present

## 2018-10-12 DIAGNOSIS — R197 Diarrhea, unspecified: Secondary | ICD-10-CM | POA: Diagnosis not present

## 2018-10-12 LAB — COMPREHENSIVE METABOLIC PANEL
ALT: 15 U/L (ref 0–44)
AST: 26 U/L (ref 15–41)
Albumin: 4.1 g/dL (ref 3.5–5.0)
Alkaline Phosphatase: 62 U/L (ref 38–126)
Anion gap: 12 (ref 5–15)
BUN: 26 mg/dL — ABNORMAL HIGH (ref 8–23)
CO2: 25 mmol/L (ref 22–32)
Calcium: 9 mg/dL (ref 8.9–10.3)
Chloride: 102 mmol/L (ref 98–111)
Creatinine, Ser: 1.3 mg/dL — ABNORMAL HIGH (ref 0.44–1.00)
GFR calc Af Amer: 42 mL/min — ABNORMAL LOW (ref >60)
GFR calc non Af Amer: 36 mL/min — ABNORMAL LOW (ref >60)
Glucose, Bld: 87 mg/dL (ref 70–99)
Potassium: 4.1 mmol/L (ref 3.5–5.1)
Sodium: 139 mmol/L (ref 135–145)
Total Bilirubin: 0.7 mg/dL (ref 0.3–1.2)
Total Protein: 7.1 g/dL (ref 6.5–8.1)

## 2018-10-12 LAB — CBC
HCT: 37.2 % (ref 36.0–46.0)
Hemoglobin: 11.7 g/dL — ABNORMAL LOW (ref 12.0–15.0)
MCH: 30.4 pg (ref 26.0–34.0)
MCHC: 31.5 g/dL (ref 30.0–36.0)
MCV: 96.6 fL (ref 80.0–100.0)
Platelets: 187 10*3/uL (ref 150–400)
RBC: 3.85 MIL/uL — ABNORMAL LOW (ref 3.87–5.11)
RDW: 20.7 % — ABNORMAL HIGH (ref 11.5–15.5)
WBC: 9.9 10*3/uL (ref 4.0–10.5)
nRBC: 0 % (ref 0.0–0.2)

## 2018-10-12 LAB — POC OCCULT BLOOD, ED: Fecal Occult Bld: POSITIVE — AB

## 2018-10-12 LAB — TYPE AND SCREEN
ABO/RH(D): A NEG
Antibody Screen: NEGATIVE

## 2018-10-12 MED ORDER — MORPHINE SULFATE (PF) 4 MG/ML IV SOLN
4.0000 mg | Freq: Once | INTRAVENOUS | Status: AC
  Administered 2018-10-12: 19:00:00 4 mg via INTRAVENOUS
  Filled 2018-10-12: qty 1

## 2018-10-12 MED ORDER — ONDANSETRON HCL 4 MG/2ML IJ SOLN
4.0000 mg | Freq: Once | INTRAMUSCULAR | Status: AC
  Administered 2018-10-13: 4 mg via INTRAVENOUS
  Filled 2018-10-12: qty 2

## 2018-10-12 MED ORDER — METRONIDAZOLE IN NACL 5-0.79 MG/ML-% IV SOLN
500.0000 mg | Freq: Once | INTRAVENOUS | Status: AC
  Administered 2018-10-13: 500 mg via INTRAVENOUS
  Filled 2018-10-12: qty 100

## 2018-10-12 MED ORDER — LEVOFLOXACIN IN D5W 750 MG/150ML IV SOLN: 750.0000 mg | INTRAVENOUS | Status: DC

## 2018-10-12 NOTE — Telephone Encounter (Signed)
I would love any assistance I can get for this patient.  Certainly, if we are not able to secure community, I would favor placement.  Please feel free to reach out to her daughter, Santiago Glad as well.  She would certainly like to be kept in the loop with regards to her mother's care.  Her number is in the chart.

## 2018-10-12 NOTE — Progress Notes (Signed)
Telephone visit  Subjective: CC: allergic reaction PCP: [name], DO XTG:GYIRSW Kimberly [name] is a 85 y.o. female calls for telephone consult today. Patient provides verbal consent for consult held via phone.  Location of patient: home Location of provider: Working remotely from home Others present for call: none  1.  Allergic reaction Patient reports that she has had allergic reaction to a medication that she was prescribed yesterday at [facility] emergency department.  She describes the allergic reaction as bilateral ankle swelling and diarrhea.  She goes on to state that she has chronic ankle swelling which is relieved by Lasix and that the swelling started prior to the start of doxycycline.  She reports being fearful of the possible side effects of doxycycline and therefore is worried about taking it.  To reiterate she has multiple "drug allergies" and essentially the doxycycline was the only alternative outpatient therapy that could be provided to the patient by the emergency department.  She has a gastric provider in [place] but notes that she does not want to see them anymore and wants to establish with somebody in [place].  She denies fevers, vomiting.  She is tolerating p.o. intake without difficulty.  No facial swelling, oral swelling or difficulty breathing.  No rashes.   ROS: Per HPI  Allergies  Allergen Reactions  . Ciprofloxacin Other (See Comments)    Possibly caused diarrhea November 2018  . Flagyl [Metronidazole] Other (See Comments)    Possibly caused diarrhea November 2018  . Penicillins Hives    DID THE REACTION INVOLVE: Swelling of the face/tongue/throat, SOB, or low BP? Unknown Sudden or severe rash/hives, skin peeling, or the inside of the mouth or nose? Yes Did it require medical treatment? Yes When did it last happen?16 or 85 years old If all above answers are "NO", may proceed with cephalosporin use.  . Sulfa Antibiotics Rash   Past Medical  History:  Diagnosis Date  . Acute blood loss anemia 10/11/2012  . Acute diverticulitis 08/24/2013  . Acute on chronic combined systolic and diastolic CHF, NYHA class 4 ([place]) 11/15/2013  . Antral ulcer 10/11/2012  . Arrhythmia    atrial fibb  . Atrial fibrillation ([place])   . Cardiomyopathy, nonischemic ([place])   . Chronic anticoagulation 10/12/2012  . CKD (chronic kidney disease) stage 3, GFR 30-59 ml/min ([facility]) 10/12/2012  . Depression   . Erosive esophagitis 10/11/2012  . Fibromyalgia   . Glaucoma   . H/O echocardiogram 2007   EF 40-45%,         . Hypertension   . Osteoarthritis   . Pacemaker    Last saw cards 07/2013  . Scoliosis     Current Outpatient Medications:  .  acetaminophen (TYLENOL) 500 MG tablet, Take 1,000 mg by mouth every 6 (six) hours as needed for headache (pain)., Disp: , Rfl:  .  apixaban (ELIQUIS) 2.5 MG TABS tablet, Take 1 tablet (2.5 mg total) by mouth 2 (two) times daily., Disp: 180 tablet, Rfl: 1 .  carvedilol (COREG) 6.25 MG tablet, Take 1 tablet (6.25 mg total) by mouth 2 (two) times daily with a meal., Disp: 60 tablet, Rfl: 11 .  furosemide (LASIX) 20 MG tablet, Take 1 tablet (20 mg total) by mouth daily., Disp: 90 tablet, Rfl: 3 .  Multiple Vitamins-Minerals (PRESERVISION AREDS 2) CAPS, Take 1 capsule by mouth 2 (two) times daily. , Disp: , Rfl:  .  omeprazole (PRILOSEC OTC) 20 MG tablet, Take 1 tablet (20 mg total) by mouth daily.,  Disp: 30 tablet, Rfl: 0 .  OVER THE COUNTER MEDICATION, Take 1 capsule by mouth 2 (two) times daily. Omega XL, Disp: , Rfl:  .  oxyCODONE (ROXICODONE) 5 MG immediate release tablet, 1/2-1 PO QHS PRN ABDOMINAL PAIN, Disp: 30 tablet, Rfl: 0 .  Polyethyl Glycol-Propyl Glycol (SYSTANE ULTRA) 0.4-0.3 % SOLN, Place 1-2 drops 2 (two) times daily as needed into both eyes (dry eyes). , Disp: , Rfl:  .  potassium chloride SA (K-DUR,KLOR-CON) 20 MEQ tablet, Take 1 tablet (20 mEq total) by mouth daily., Disp: 30 tablet, Rfl: 2 .  QUEtiapine  (SEROQUEL) 100 MG tablet, Take 0.5 tablets (50 mg total) by mouth at bedtime. (Patient not taking: Reported on 10/04/2018), Disp: 30 tablet, Rfl: 3 .  sodium chloride (OCEAN) 0.65 % SOLN nasal spray, Place 1 spray 2 (two) times daily as needed into both nostrils for congestion. , Disp: , Rfl:  .  traZODone (DESYREL) 50 MG tablet, Take 3 tablets (150 mg total) by mouth at bedtime as needed for sleep., Disp: 30 tablet, Rfl: 3  Assessment/ Plan: 85 y.o. female   1. Diverticulitis She is being treated with doxycycline.  I do not feel that the symptoms which she is describing are allergic in nature at all.  I have advised her to continue the medication as prescribed for treatment of the infection in her colon.  If she feels that she is unable to take this medication she should seek medical attention in the emergency department as she has multiple drug allergies with which prevent transition to alternative therapies outpatient.  I placed a referral to gastroenterology in [place] per her request that she does not want to see the gastroenterologist she had been previously established with in [place]. - Ambulatory referral to Gastroenterology  2. Peptic ulcer disease Continue PPI. - Ambulatory referral to Gastroenterology  Patient seems to have a poor understanding of her health care.  I do wonder if her underlying mental health disorder is contributing.  She does not have any family or friends locally and depends upon public transportation for rides to appointments.  She has multiple emergency department visits both within and outside of the [facility].  I will place referral to [facility] to see if perhaps they may be of assistance.  Start time: 10:36am End time: 10:54am  Total time spent on patient care (including telephone call/ virtual visit): 21 minutes  [place], [place] [phone]

## 2018-10-12 NOTE — H&P (Signed)
History and Physical    Kimberly [name] BTD:[id number] DOB: 1926/08/08 DOA: 10/12/2018  PCP: [name], DO   Patient coming from: Home   Chief Complaint: Abdominal pain, rectal bleeding   HPI: Kimberly [name] is a 85 y.o. female with medical history significant for bipolar disorder, atrial fibrillation on Eliquis, chronic kidney disease stage III, anxiety, chronic diastolic CHF, and hypertension, now presenting to the emergency department for evaluation of lower abdominal pain and rectal bleeding.  Patient reports that she was recently evaluated at an outside hospital for these complaints, was diagnosed with acute diverticulitis, and due to her multiple listed drug allergies, she was started on doxycycline.  The patient reports continued, and worsening pain in the lower abdomen and increased rectal bleeding despite the antibiotic.  She denies any fevers or chills, denies nausea and vomiting, and denied lightheadedness or chest pain.  ED Course: Upon arrival to the ED, patient is found to be afebrile, saturating well on room air, and with remaining vitals also normal.  Chemistry panel is notable for creatinine 1.30, consistent with her apparent baseline.  CBC features a hemoglobin of 11.7, increased from prior.  Fecal occult blood testing is positive.  COVID-19 screening test remains pending.  Patient was given 4 mg IV morphine and empiric antibiotics in the emergency department and hospitalists are asked to admit.  Review of Systems:  All other systems reviewed and apart from HPI, are negative.  Past Medical History:  Diagnosis Date  . Acute blood loss anemia 10/11/2012  . Acute diverticulitis 08/24/2013  . Acute on chronic combined systolic and diastolic CHF, NYHA class 4 ([place]) 11/15/2013  . Antral ulcer 10/11/2012  . Arrhythmia    atrial fibb  . Atrial fibrillation ([place])   . Cardiomyopathy, nonischemic ([place])   . Chronic anticoagulation 10/12/2012  . CKD (chronic kidney disease) stage 3,  GFR 30-59 ml/min ([facility]) 10/12/2012  . Depression   . Erosive esophagitis 10/11/2012  . Fibromyalgia   . Glaucoma   . H/O echocardiogram 2007   EF 40-45%,         . Hypertension   . Osteoarthritis   . Pacemaker    Last saw cards 07/2013  . Scoliosis     Past Surgical History:  Procedure Laterality Date  . ABDOMINAL HYSTERECTOMY    . APPENDECTOMY    . BACK SURGERY    . BIOPSY  03/03/2017   Procedure: BIOPSY;  Surgeon: Danie Binder, MD;  Location: [facility];  Service: Endoscopy;;  gastric  . BREAST SURGERY    . CARDIAC CATHETERIZATION  12/08/2005   LAD AND LEFT MAIN WITH NO HIGH-GRADE STENOSIS. MILD DISEASE IN THE CX AND LAD SYSTEM. SEVERE LV DYSFUNCTION WITH DILATION OF THE LV. EF 15-20%. LV END-DIASTOLIC PRESSURE IS 90. +1 MR.  . CHOLECYSTECTOMY    . COLONOSCOPY N/A 03/03/2017   Procedure: COLONOSCOPY;  Surgeon: Danie Binder, MD;  Location: [facility];  Service: Endoscopy;  Laterality: N/A;  . CYSTOSCOPY N/A 02/24/2013   Procedure: CYSTOSCOPY WITH URETHRAL DILITATION;  Surgeon: Marissa Nestle, MD;  Location: [facility];  Service: Urology;  Laterality: N/A;  . DOPPLER ECHOCARDIOGRAPHY N/A 05/30/2010   LV SIZE IS NORMAL. LV SYSTOLIC FUNCTION IS LOW NORMAL. EF=50-55%. MILD INFERIOR HYPOKINESIS.MILD TO MODERATE POSTERIOR WALL HYPOKINESIS.PACEMAKER LEAD IN THE RV. LA IS MILDLY DILATED. RA IS MODERATE TO SEVERLY DILATED. PACEMAKER LEAD IN THE RA. MILD CALCICICATION OF THE MV APPARATUS. MODERATE MR. MILD TO MODERATE TR. MILD PHTN.AV MILDLY  SCLEROTIC.  [name] ESOPHAGOGASTRODUODENOSCOPY N/A 10/13/2012   Dr. Gala Romney: severe ulcerative reflux esophagitis, question of Barrett's but negative path, single deep prepyloric antral ulcer, negative H.pylori  . ESOPHAGOGASTRODUODENOSCOPY N/A 03/03/2017   Procedure: ESOPHAGOGASTRODUODENOSCOPY (EGD);  Surgeon: Danie Binder, MD;  [facility];  Service: Endoscopy;  Laterality: N/A;  . HERNIA REPAIR     right inguinal hernia and umbilical  .  LOWETR EXT VENOUS Bilateral 11-08-10   R & L- NO EVIDENCE OF THROMBUS OR THROMBOPHLEBITIS. THERE IS MILD AMOUNT OF SUBCUTANEOUS EDEMA NOTED WITHIN THE LEFT CALF AND ANKLE. R & L GSV AND SSV- NO VENOUS INSUFF NOTED.  [name] NECK SURGERY    . NUCLEAR STRESS TEST N/A 02/13/2009   NORMAL PATTERN OF PERFUSION IN ALL REGIONS. POST STRESS VENTICULAR SIZE IS NORMAL. POST  STESS EF 85%.  NORMAL MYOCARDIAL PERFUSION STUDY.  [name] PACEMAKER INSERTION    . POLYPECTOMY  03/03/2017   Procedure: POLYPECTOMY;  Surgeon: Danie Binder, MD;  [facility];  Service: Endoscopy;;  colon  . TONSILLECTOMY    . YAG LASER APPLICATION Bilateral 3/41/9379   Procedure: YAG LASER APPLICATION;  Surgeon: Williams Che, MD;  Location: [facility];  Service: Ophthalmology;  Laterality: Bilateral;     reports that she quit smoking about 13 years ago. Her smoking use included cigarettes. She has a 15.00 pack-year smoking history. She has never used smokeless tobacco. She reports current alcohol use. She reports that she does not use drugs.  Allergies  Allergen Reactions  . Ciprofloxacin Other (See Comments)    Possibly caused diarrhea November 2018  . Flagyl [Metronidazole] Other (See Comments)    Possibly caused diarrhea November 2018  . Iron Swelling    Ferrous Sulfate - tongue swelling   . Papaya Derivatives Hives  . Iodine Rash and Other (See Comments)    REACTION:If injected,  Rash/irritated skin reaction "welts"  . Penicillins Hives    DID THE REACTION INVOLVE: Swelling of the face/tongue/throat, SOB, or low BP? Unknown Sudden or severe rash/hives, skin peeling, or the inside of the mouth or nose? Yes Did it require medical treatment? Yes When did it last happen?24 or 85 years old If all above answers are "NO", may proceed with cephalosporin use.  . Sulfa Antibiotics Rash    Family History  Problem Relation Age of Onset  . Cancer Sister   . Asthma Sister   . Heart failure Brother   . Pulmonary  embolism Brother   . Cancer Brother   . Colon cancer Neg Hx      Prior to Admission medications   Medication Sig Start Date End Date Taking? Authorizing Provider  acetaminophen (TYLENOL) 500 MG tablet Take 1,000 mg by mouth every 6 (six) hours as needed for headache (pain).   Yes [provider]  apixaban (ELIQUIS) 2.5 MG TABS tablet Take 1 tablet (2.5 mg total) by mouth 2 (two) times daily. 08/02/18  Yes Croitoru, Mihai, MD  carvedilol (COREG) 6.25 MG tablet Take 1 tablet (6.25 mg total) by mouth 2 (two) times daily with a meal. 05/13/18  Yes Croitoru, Mihai, MD  doxycycline (VIBRA-TABS) 100 MG tablet Take 100 mg by mouth 2 (two) times a day. [place] ON 10/11/2018 10/11/18  Yes [provider]  furosemide (LASIX) 20 MG tablet Take 1 tablet (20 mg total) by mouth daily. 05/25/18  Yes Croitoru, Mihai, MD  Multiple Vitamins-Minerals (PRESERVISION AREDS 2) CAPS Take 1 capsule by mouth 2 (two) times  daily.    Yes [provider]  omeprazole (PRILOSEC OTC) 20 MG tablet Take 1 tablet (20 mg total) by mouth daily. Patient taking differently: Take 20 mg by mouth daily as needed (FOR ACID REFLUX).  10/04/18  Yes Gottschalk, Leatrice Jewels M, DO  Polyethyl Glycol-Propyl Glycol (SYSTANE ULTRA) 0.4-0.3 % SOLN Place 1-2 drops 2 (two) times daily as needed into both eyes (dry eyes).    Yes [provider]  potassium chloride SA (K-DUR,KLOR-CON) 20 MEQ tablet Take 1 tablet (20 mEq total) by mouth daily. 04/24/18  Yes Isaiah Serge, NP  sodium chloride (OCEAN) 0.65 % SOLN nasal spray Place 1 spray 2 (two) times daily as needed into both nostrils for congestion.    Yes [provider]  QUEtiapine (SEROQUEL) 100 MG tablet Take 0.5 tablets (50 mg total) by mouth at bedtime. Patient not taking: Reported on 10/04/2018 09/29/18   [name], DO  traZODone (DESYREL) 50 MG tablet Take 3 tablets (150 mg total) by mouth at bedtime as needed for sleep. Patient not taking:  Reported on 10/12/2018 09/29/18   [name], DO    Physical Exam: Vitals:   10/12/18 1630 10/12/18 1700 10/12/18 2020 10/12/18 2030  BP: (!) 153/72 (!) 174/83 (!) 155/79 (!) 147/75  Pulse:      Resp:   18 18  Temp:      TempSrc:      SpO2:      Weight:      Height:        Constitutional: NAD, calm  Eyes: PERTLA, lids and conjunctivae normal ENMT: Mucous membranes are moist. Posterior pharynx clear of any exudate or lesions.   Neck: normal, supple, no masses, no thyromegaly Respiratory: no wheezing, no crackles. Normal respiratory effort. No accessory muscle use.  Cardiovascular: Rate ~80 and irregularly irregular. Ankle edema bilaterally.   Abdomen: No distension, soft, tender, no rebound pain or guarding. Bowel sounds normal.  Musculoskeletal: no clubbing / cyanosis. No joint deformity upper and lower extremities.   Skin: Healing wound with crust lower left leg. Warm, dry, well-perfused. Neurologic: CN 2-12 grossly intact. Sensation intact. Moving all extremities.  Psychiatric: Alert and oriented x 3. Very pleasant, cooperative.    Labs on Admission: I have personally reviewed following labs and imaging studies  CBC: Recent Labs  Lab 10/12/18 1401  WBC 9.9  HGB 11.7*  HCT 37.2  MCV 96.6  PLT 706   Basic Metabolic Panel: Recent Labs  Lab 10/12/18 1401  NA 139  K 4.1  CL 102  CO2 25  GLUCOSE 87  BUN 26*  CREATININE 1.30*  CALCIUM 9.0   GFR: Estimated Creatinine Clearance: 28.5 mL/min (A) (by C-G formula based on SCr of 1.3 mg/dL (H)). Liver Function Tests: Recent Labs  Lab 10/12/18 1401  AST 26  ALT 15  ALKPHOS 62  BILITOT 0.7  PROT 7.1  ALBUMIN 4.1   No results for input(s): LIPASE, AMYLASE in the last 168 hours. No results for input(s): AMMONIA in the last 168 hours. Coagulation Profile: No results for input(s): INR, PROTIME in the last 168 hours. Cardiac Enzymes: No results for input(s): CKTOTAL, CKMB, CKMBINDEX, TROPONINI in the last  168 hours. BNP (last 3 results) No results for input(s): PROBNP in the last 8760 hours. HbA1C: No results for input(s): HGBA1C in the last 72 hours. CBG: No results for input(s): GLUCAP in the last 168 hours. Lipid Profile: No results for input(s): CHOL, HDL, LDLCALC, TRIG, CHOLHDL, LDLDIRECT in the  last 72 hours. Thyroid Function Tests: No results for input(s): TSH, T4TOTAL, FREET4, T3FREE, THYROIDAB in the last 72 hours. Anemia Panel: No results for input(s): VITAMINB12, FOLATE, FERRITIN, TIBC, IRON, RETICCTPCT in the last 72 hours. Urine analysis:    Component Value Date/Time   COLORURINE YELLOW 04/19/2018 0523   APPEARANCEUR CLEAR 04/19/2018 0523   LABSPEC 1.013 04/19/2018 0523   PHURINE 7.0 04/19/2018 0523   GLUCOSEU NEGATIVE 04/19/2018 0523   HGBUR NEGATIVE 04/19/2018 0523   BILIRUBINUR NEGATIVE 04/19/2018 0523   BILIRUBINUR neg 04/25/2014 0855   KETONESUR NEGATIVE 04/19/2018 0523   PROTEINUR NEGATIVE 04/19/2018 0523   UROBILINOGEN 0.2 05/26/2014 1415   NITRITE NEGATIVE 04/19/2018 0523   LEUKOCYTESUR TRACE (A) 04/19/2018 0523   Sepsis Labs: @LABRCNTIP (procalcitonin:4,lacticidven:4) )No results found for this or any previous visit (from the past 240 hour(s)).   Radiological Exams on Admission: Ct Abdomen Pelvis Wo Contrast  Result Date: 10/12/2018 CLINICAL DATA:  85 year old female with abdominal pain for 3-4 days, rectal bleeding for 2-3 weeks. EXAM: CT ABDOMEN AND PELVIS WITHOUT CONTRAST TECHNIQUE: Multidetector CT imaging of the abdomen and pelvis was performed following the standard protocol without IV contrast. COMPARISON:  CT Abdomen and [facility] 04/07/2018, and earlier. FINDINGS: Lower chest: Chronic cardiomegaly. Cardiac pacemaker leads. Chronic mild lung base scarring. No pericardial or pleural effusion. Hepatobiliary: Surgically absent gallbladder. Stable noncontrast liver with several small benign cysts or hemangiomas. Pancreas: Negative.  Spleen: Negative. Adrenals/Urinary Tract: Normal adrenal glands. Punctate right nephrolithiasis is stable. No hydronephrosis. Proximal ureters are decompressed. Mild bilateral perinephric stranding is chronic. Diminutive and unremarkable urinary bladder. Stomach/Bowel: Negative rectum. Mild mesenteric stranding about the sigmoid colon with severe underlying diverticulosis (series 2, image 60). Indistinct appearance of the sigmoid in this region. No extraluminal gas or fluid. Diverticulosis continues in the descending colon with a 2nd area of questionable mild mesenteric inflammation on series 2, image 29. Redundant transverse colon also with diverticulosis, no active inflammation. No diverticulosis in the right colon. Negative terminal ileum. No dilated small bowel. Small to moderate gastric hiatal hernia is stable. No free air, free fluid. Vascular/Lymphatic: Extensive Aortoiliac calcified atherosclerosis. Vascular patency is not evaluated in the absence of IV contrast. No lymphadenopathy. Reproductive: Surgically absent. Other: No pelvic free fluid. Musculoskeletal: Osteopenia, scoliosis, spine degeneration. No acute osseous abnormality identified. IMPRESSION: 1. Severe diverticulosis of the descending and sigmoid colon. Mild Acute Diverticulitis in the mid sigmoid, and perhaps a 2nd site of active inflammation also in the mid descending colon. No perforation, abscess or complicating features. 2. No other acute or inflammatory process in the abdomen or pelvis. 3.  Aortic Atherosclerosis (ICD10-I70.0). Electronically Signed   By: Genevie Ann M.D.   On: 10/12/2018 22:53    EKG: Not performed.   Assessment/Plan   1. Acute diverticulitis with bleeding  - Presents with lower abdominal pain and rectal bleeding, reports being diagnosed with diverticulitis recently and started on doxycycline but has been worsening despite the antibiotic  - She is afebrile and hemodynamically stable on admission with Hgb 11.7, higher  than previous  - FOBT is positive in ED  - CT consistent with acute diverticulitis without perforation, abscess, or obstruction  - Hold Eliquis, type & screen, trend H&H, continue antibiotics with ciprofloxacin and Flagyl    2. Atrial fibrillation   - CHADS-VASc is at least 14 (age x2, gender, CHF, HTN) - Hold Eliquis given GI bleeding, continue beta-blocker as tolerated   3. CKD stage III  - SCr is 1.30 on  admission, consistent with her apparent baseline  - Renally-dose medications, monitor    4. Chronic diastolic CHF  - Appears compensated, mild ankle swelling noted   - Hold Lasix while NPO, SLIV, follow daily wt and I/O's, continue Coreg as tolerated   5. Bipolar disorder  - Patient has hx of BPD - She is calm and cooperative in ED - Continue Seroquel and trazodone    6. Hypertension  - BP at goal, continue Coreg as tolerated    PPE: Mask, face shield DVT prophylaxis: SCD's; Eliquis pta  Code Status: Full  Family Communication: Discussed with patient  Consults called: None Admission status: Observation     Vianne Bulls, MD [facility] Pager [phone]  If 7PM-7AM, please contact night-coverage [email or web address] Password [email or web address]  10/12/2018, 11:58 PM

## 2018-10-12 NOTE — ED Provider Notes (Signed)
[facility] EMERGENCY DEPARTMENT Provider Note   CSN: [id number] Arrival date & time: 10/12/18  1306    History   Chief Complaint Chief Complaint  Patient presents with  . Rectal Bleeding    HPI Kimberly [name] is a 85 y.o. female with history of acute blood loss anemia, acute diverticulitis, CHF, A. fib, nonischemic cardiomyopathy, CKD, erosive esophagitis, fibromyalgia, hypertension presents for evaluation of acute onset, progressively worsening rectal bleeding for 2 to 3 weeks.  She reports that she has experienced rectal bleeding off and on for several years but this has been persistent.  She notes that she will sometimes have blood soaking through her underwear and her sheets in her bed.  She states that for the last 3 or 4 days she has had lower abdominal pain worse on the right side.  She was seen at an outside hospital a few days ago and was apparently diagnosed with diverticulitis though she is unsure if they did any imaging to evaluate for this.  She states they discharged her with doxycycline due to multiple medication allergies but she states she does not like the side effects she was experiencing with the medication which includes diarrhea and shortness of breath.  She denies fever, cough, chest pains, urinary symptoms.  He states that she had a colonoscopy several years ago which showed polyps and states it was recommended that she follow-up with a gastroenterologist but she has not done so "because I am afraid".  She is currently anticoagulated on Eliquis.     The history is provided by the patient.    Past Medical History:  Diagnosis Date  . Acute blood loss anemia 10/11/2012  . Acute diverticulitis 08/24/2013  . Acute on chronic combined systolic and diastolic CHF, NYHA class 4 ([place]) 11/15/2013  . Antral ulcer 10/11/2012  . Arrhythmia    atrial fibb  . Atrial fibrillation ([place])   . Cardiomyopathy, nonischemic ([place])   . Chronic anticoagulation 10/12/2012  . CKD (chronic kidney  disease) stage 3, GFR 30-59 ml/min (HCC) 10/12/2012  . Depression   . Erosive esophagitis 10/11/2012  . Fibromyalgia   . Glaucoma   . H/O echocardiogram 2007   EF 40-45%,         . Hypertension   . Osteoarthritis   . Pacemaker    Last saw cards 07/2013  . Scoliosis     Patient Active Problem List   Diagnosis Date Noted  . Enteritis of small intestine due to enterotoxigenic Escherichia coli associated with diarrhea 03/24/2017  . Bipolar affective disorder ([place]) 03/23/2017  . Olfactory hallucinations 03/23/2017  . Lower GI bleed   . Glaucoma 02/28/2017  . Pacemaker 03/21/2015  . Non-ischemic cardiomyopathy ([place]) 11/28/2014  . PUD (peptic ulcer disease) 06/14/2014  . History of bipolar disorder   . Hypokalemia 05/26/2014  . Fall at home 05/26/2014  . Chronic diastolic heart failure ([place]) 11/15/2013  . Complete heart block ([place]) 11/15/2013  . Inability to ambulate due to ankle or foot 09/05/2013  . Unspecified constipation 08/26/2013  . Anemia 08/25/2013  . CKD (chronic kidney disease) stage 3, GFR 30-59 ml/min (HCC) 10/12/2012  . Chronic anticoagulation 10/12/2012  . Osteoarthritis   . Permanent atrial fibrillation   . Biventricular cardiac pacemaker - [employer] Consulta   . Fibromyalgia   . Essential hypertension   . Depression     Past Surgical History:  Procedure Laterality Date  . ABDOMINAL HYSTERECTOMY    . APPENDECTOMY    . BACK SURGERY    .  BIOPSY  03/03/2017   Procedure: BIOPSY;  Surgeon: Danie Binder, MD;  Location: [facility];  Service: Endoscopy;;  gastric  . BREAST SURGERY    . CARDIAC CATHETERIZATION  12/08/2005   LAD AND LEFT MAIN WITH NO HIGH-GRADE STENOSIS. MILD DISEASE IN THE CX AND LAD SYSTEM. SEVERE LV DYSFUNCTION WITH DILATION OF THE LV. EF 15-20%. LV END-DIASTOLIC PRESSURE IS 90. +1 MR.  . CHOLECYSTECTOMY    . COLONOSCOPY N/A 03/03/2017   Procedure: COLONOSCOPY;  Surgeon: Danie Binder, MD;  Location: [facility];  Service: Endoscopy;   Laterality: N/A;  . CYSTOSCOPY N/A 02/24/2013   Procedure: CYSTOSCOPY WITH URETHRAL DILITATION;  Surgeon: Marissa Nestle, MD;  Location: [facility] ORS;  Service: Urology;  Laterality: N/A;  . DOPPLER ECHOCARDIOGRAPHY N/A 05/30/2010   LV SIZE IS NORMAL. LV SYSTOLIC FUNCTION IS LOW NORMAL. EF=50-55%. MILD INFERIOR HYPOKINESIS.MILD TO MODERATE POSTERIOR WALL HYPOKINESIS.PACEMAKER LEAD IN THE RV. LA IS MILDLY DILATED. RA IS MODERATE TO SEVERLY DILATED. PACEMAKER LEAD IN THE RA. MILD CALCICICATION OF THE MV APPARATUS. MODERATE MR. MILD TO MODERATE TR. MILD PHTN.AV MILDLY SCLEROTIC.  [name] ESOPHAGOGASTRODUODENOSCOPY N/A 10/13/2012   Dr. Gala Romney: severe ulcerative reflux esophagitis, question of Barrett's but negative path, single deep prepyloric antral ulcer, negative H.pylori  . ESOPHAGOGASTRODUODENOSCOPY N/A 03/03/2017   Procedure: ESOPHAGOGASTRODUODENOSCOPY (EGD);  Surgeon: Danie Binder, MD;  Location: [facility];  Service: Endoscopy;  Laterality: N/A;  . HERNIA REPAIR     right inguinal hernia and umbilical  . LOWETR EXT VENOUS Bilateral 11-08-10   R & L- NO EVIDENCE OF THROMBUS OR THROMBOPHLEBITIS. THERE IS MILD AMOUNT OF SUBCUTANEOUS EDEMA NOTED WITHIN THE LEFT CALF AND ANKLE. R & L GSV AND SSV- NO VENOUS INSUFF NOTED.  [name] NECK SURGERY    . NUCLEAR STRESS TEST N/A 02/13/2009   NORMAL PATTERN OF PERFUSION IN ALL REGIONS. POST STRESS VENTICULAR SIZE IS NORMAL. POST  STESS EF 85%.  NORMAL MYOCARDIAL PERFUSION STUDY.  [name] PACEMAKER INSERTION    . POLYPECTOMY  03/03/2017   Procedure: POLYPECTOMY;  Surgeon: Danie Binder, MD;  Location: [facility];  Service: Endoscopy;;  colon  . TONSILLECTOMY    . YAG LASER APPLICATION Bilateral 08/29/621   Procedure: YAG LASER APPLICATION;  Surgeon: Williams Che, MD;  Location: [facility] OR[facility];  Service: Ophthalmology;  Laterality: Bilateral;     OB History    Gravida  5   Para  2   Term  1   Preterm  1   AB  3   Living  2     SAB  3   TAB      Ectopic       Multiple      Live Births               Home Medications    Prior to Admission medications   Medication Sig Start Date End Date Taking? Authorizing Provider  acetaminophen (TYLENOL) 500 MG tablet Take 1,000 mg by mouth every 6 (six) hours as needed for headache (pain).   Yes [provider]  apixaban (ELIQUIS) 2.5 MG TABS tablet Take 1 tablet (2.5 mg total) by mouth 2 (two) times daily. 08/02/18  Yes Croitoru, Mihai, MD  carvedilol (COREG) 6.25 MG tablet Take 1 tablet (6.25 mg total) by mouth 2 (two) times daily with a meal. 05/13/18  Yes Croitoru, Mihai, MD  doxycycline (VIBRA-TABS) 100 MG tablet Take 100 mg by mouth 2 (two) times a day. 10 DAY  COURSE STARTING ON 10/11/2018 10/11/18  Yes [provider]  furosemide (LASIX) 20 MG tablet Take 1 tablet (20 mg total) by mouth daily. 05/25/18  Yes Croitoru, Mihai, MD  Multiple Vitamins-Minerals (PRESERVISION AREDS 2) CAPS Take 1 capsule by mouth 2 (two) times daily.    Yes [provider]  omeprazole (PRILOSEC OTC) 20 MG tablet Take 1 tablet (20 mg total) by mouth daily. Patient taking differently: Take 20 mg by mouth daily as needed (FOR ACID REFLUX).  10/04/18  Yes Gottschalk, Leatrice Jewels M, DO  Polyethyl Glycol-Propyl Glycol (SYSTANE ULTRA) 0.4-0.3 % SOLN Place 1-2 drops 2 (two) times daily as needed into both eyes (dry eyes).    Yes [provider]  potassium chloride SA (K-DUR,KLOR-CON) 20 MEQ tablet Take 1 tablet (20 mEq total) by mouth daily. 04/24/18  Yes Isaiah Serge, NP  sodium chloride (OCEAN) 0.65 % SOLN nasal spray Place 1 spray 2 (two) times daily as needed into both nostrils for congestion.    Yes [provider]  oxyCODONE (ROXICODONE) 5 MG immediate release tablet 1/2-1 PO QHS PRN ABDOMINAL PAIN Patient not taking: Reported on 10/12/2018 06/02/18   Danie Binder, MD  QUEtiapine (SEROQUEL) 100 MG tablet Take 0.5 tablets (50 mg total) by mouth at bedtime. Patient not taking: Reported on  10/04/2018 09/29/18   [name], DO  traZODone (DESYREL) 50 MG tablet Take 3 tablets (150 mg total) by mouth at bedtime as needed for sleep. Patient not taking: Reported on 10/12/2018 09/29/18   [name], DO    Family History Family History  Problem Relation Age of Onset  . Cancer Sister   . Asthma Sister   . Heart failure Brother   . Pulmonary embolism Brother   . Cancer Brother   . Colon cancer Neg Hx     Social History Social History   Tobacco Use  . Smoking status: Former Smoker    Packs/day: 0.50    Years: 30.00    Pack years: 15.00    Types: Cigarettes    Last attempt to quit: 12/13/2004    Years since quitting: 13.8  . Smokeless tobacco: Never Used  . Tobacco comment: former smoker  Substance Use Topics  . Alcohol use: Yes    Alcohol/week: 0.0 standard drinks    Comment: history of drinking a 1/2 glass of wine in the evening  . Drug use: No     Allergies   Ciprofloxacin; Flagyl [metronidazole]; Iron; Papaya derivatives; Iodine; Penicillins; and Sulfa antibiotics   Review of Systems Review of Systems  Constitutional: Negative for chills and fever.  Cardiovascular: Negative for chest pain.  Gastrointestinal: Positive for abdominal pain, blood in stool and diarrhea. Negative for nausea and vomiting.  All other systems reviewed and are negative.    Physical Exam Updated Vital Signs BP (!) 155/79   Pulse 71   Temp 97.9 F (36.6 C) (Oral)   Resp 18   Ht 5\' 7"  (1.702 m)   Wt 68 kg   SpO2 93%   BMI 23.49 kg/m   Physical Exam Vitals signs and nursing note reviewed.  Constitutional:      General: She is not in acute distress.    Appearance: She is well-developed.     Comments: Chronically ill in appearance  HENT:     Head: Normocephalic and atraumatic.  Eyes:     General:        Right eye: No discharge.  Left eye: No discharge.     Conjunctiva/sclera: Conjunctivae normal.  Neck:     Vascular: No JVD.     Trachea: No  tracheal deviation.  Cardiovascular:     Rate and Rhythm: Normal rate.  Pulmonary:     Effort: Pulmonary effort is normal.     Breath sounds: Normal breath sounds.  Abdominal:     General: Abdomen is protuberant. Bowel sounds are increased. There is no distension.     Tenderness: There is abdominal tenderness in the right upper quadrant, right lower quadrant, suprapubic area and left lower quadrant. There is right CVA tenderness. There is no left CVA tenderness, guarding or rebound.  Genitourinary:    Comments: Examination performed in the presence of a chaperone.  Small amount of bright red blood noted surrounding the rectum; scant amount of light brown stool on DRE. No anal fissures or tears.  Skin:    General: Skin is warm and dry.     Findings: No erythema.  Neurological:     Mental Status: She is alert.  Psychiatric:        Behavior: Behavior normal.      ED Treatments / Results  Labs (all labs ordered are listed, but only abnormal results are displayed) Labs Reviewed  COMPREHENSIVE METABOLIC PANEL - Abnormal; Notable for the following components:      Result Value   BUN 26 (*)    Creatinine, Ser 1.30 (*)    GFR calc non Af Amer 36 (*)    GFR calc Af Amer 42 (*)    All other components within normal limits  CBC - Abnormal; Notable for the following components:   RBC 3.85 (*)    Hemoglobin 11.7 (*)    RDW 20.7 (*)    All other components within normal limits  POC OCCULT BLOOD, ED - Abnormal; Notable for the following components:   Fecal Occult Bld POSITIVE (*)    All other components within normal limits  TYPE AND SCREEN    EKG None  Radiology No results found.  Procedures Procedures (including critical care time)  Medications Ordered in ED Medications  morphine 4 MG/ML injection 4 mg (4 mg Intravenous Given 10/12/18 1910)     Initial Impression / Assessment and Plan / ED Course  I have reviewed the triage vital signs and the nursing notes.  Pertinent  labs & imaging results that were available during my care of the patient were reviewed by me and considered in my medical decision making (see chart for details).        Patient presenting for evaluation of lower abdominal pain and bright red blood per rectum.  She is afebrile, somewhat hypertensive in the ED.  Nontoxic in appearance.  She has heme positive stools.  Remainder blood work reviewed by me shows mild anemia, some elevation in her BUN and creatinine though this appears to be at baseline.  Will obtain CT of the abdomen and pelvis for further evaluation and rule out of complicated diverticulitis but will likely require admission for GI bleed on anticoagulation.  10:14 PM Signed out to oncoming provider PA Marshell Levan.  IV access was lost and we were unable to obtain additional IV access so she will undergo a noncontrast CT of the abdomen and pelvis.  If any surgical abnormalities, may require surgical consultation.  Ultimately, she would still benefit from admission to the hospital for further monitoring and possible GI intervention. Final Clinical Impressions(s) / ED Diagnoses   Final diagnoses:  Acute GI bleeding  Lower abdominal pain    ED Discharge Orders    None       Debroah Baller 10/12/18 2215    Ripley Fraise, MD 10/13/18 0005

## 2018-10-12 NOTE — Telephone Encounter (Signed)
I spoke to patient's daughter today, Ms. [name] who is patient's power of attorney.  She notes that she thinks that the patient is in a manic phase.  She is had a couple of manic phases over the last year and states that she tends to be aggressive during those phases.  She notes that she is not compliant with her medications and will not seek psychiatric evaluation.  Unfortunately, the patient's daughter lives about 6 hours away and is unable to come down to [place] to help.  Despite previous conversations with the patient, she actually does have family and friends locally but she is been "pushing them away".  Her daughter wonders if she should have evaluation by psychiatry and possibly an involuntary commitment to stabilize her on medication.  I informed her that I had in fact reached out to both CCM in our office and am reaching out to [facility] see if patient would qualify for interval home visits by the nurse.  We did discuss that at some point if she is demonstrating harm to self or harm to others, she may need to be placed in an assisted living or nursing facility.  I worry about falls at home given chronic anticoagulation.  I reiterated this to her daughter today.  I will contact her daughter again as soon as I have more information with regards to the referrals.  [name], [facility]

## 2018-10-12 NOTE — ED Triage Notes (Signed)
Pt presents to ED brought by [facility] for complaints of rectal bleeding. Pt with hx of polyps.

## 2018-10-13 ENCOUNTER — Other Ambulatory Visit: Payer: Self-pay

## 2018-10-13 ENCOUNTER — Ambulatory Visit: Payer: Medicare Other | Admitting: Adult Health

## 2018-10-13 DIAGNOSIS — K5733 Diverticulitis of large intestine without perforation or abscess with bleeding: Secondary | ICD-10-CM | POA: Diagnosis present

## 2018-10-13 DIAGNOSIS — Z8659 Personal history of other mental and behavioral disorders: Secondary | ICD-10-CM

## 2018-10-13 DIAGNOSIS — I428 Other cardiomyopathies: Secondary | ICD-10-CM | POA: Diagnosis present

## 2018-10-13 DIAGNOSIS — Z1159 Encounter for screening for other viral diseases: Secondary | ICD-10-CM | POA: Diagnosis not present

## 2018-10-13 DIAGNOSIS — I10 Essential (primary) hypertension: Secondary | ICD-10-CM | POA: Diagnosis not present

## 2018-10-13 DIAGNOSIS — I5032 Chronic diastolic (congestive) heart failure: Secondary | ICD-10-CM

## 2018-10-13 DIAGNOSIS — Z950 Presence of cardiac pacemaker: Secondary | ICD-10-CM | POA: Diagnosis not present

## 2018-10-13 DIAGNOSIS — M1990 Unspecified osteoarthritis, unspecified site: Secondary | ICD-10-CM | POA: Diagnosis present

## 2018-10-13 DIAGNOSIS — R569 Unspecified convulsions: Secondary | ICD-10-CM | POA: Diagnosis present

## 2018-10-13 DIAGNOSIS — I4821 Permanent atrial fibrillation: Secondary | ICD-10-CM

## 2018-10-13 DIAGNOSIS — K5792 Diverticulitis of intestine, part unspecified, without perforation or abscess without bleeding: Secondary | ICD-10-CM | POA: Diagnosis not present

## 2018-10-13 DIAGNOSIS — H409 Unspecified glaucoma: Secondary | ICD-10-CM | POA: Diagnosis present

## 2018-10-13 DIAGNOSIS — K922 Gastrointestinal hemorrhage, unspecified: Secondary | ICD-10-CM

## 2018-10-13 DIAGNOSIS — M797 Fibromyalgia: Secondary | ICD-10-CM | POA: Diagnosis present

## 2018-10-13 DIAGNOSIS — L03116 Cellulitis of left lower limb: Secondary | ICD-10-CM | POA: Diagnosis present

## 2018-10-13 DIAGNOSIS — K621 Rectal polyp: Secondary | ICD-10-CM | POA: Diagnosis present

## 2018-10-13 DIAGNOSIS — D62 Acute posthemorrhagic anemia: Secondary | ICD-10-CM | POA: Diagnosis present

## 2018-10-13 DIAGNOSIS — F419 Anxiety disorder, unspecified: Secondary | ICD-10-CM | POA: Diagnosis present

## 2018-10-13 DIAGNOSIS — Z8719 Personal history of other diseases of the digestive system: Secondary | ICD-10-CM | POA: Diagnosis not present

## 2018-10-13 DIAGNOSIS — N183 Chronic kidney disease, stage 3 (moderate): Secondary | ICD-10-CM

## 2018-10-13 DIAGNOSIS — Z880 Allergy status to penicillin: Secondary | ICD-10-CM | POA: Diagnosis not present

## 2018-10-13 DIAGNOSIS — R531 Weakness: Secondary | ICD-10-CM | POA: Diagnosis present

## 2018-10-13 DIAGNOSIS — I130 Hypertensive heart and chronic kidney disease with heart failure and stage 1 through stage 4 chronic kidney disease, or unspecified chronic kidney disease: Secondary | ICD-10-CM | POA: Diagnosis present

## 2018-10-13 DIAGNOSIS — Z7901 Long term (current) use of anticoagulants: Secondary | ICD-10-CM | POA: Diagnosis not present

## 2018-10-13 DIAGNOSIS — Z8711 Personal history of peptic ulcer disease: Secondary | ICD-10-CM | POA: Diagnosis not present

## 2018-10-13 DIAGNOSIS — F331 Major depressive disorder, recurrent, moderate: Secondary | ICD-10-CM | POA: Diagnosis not present

## 2018-10-13 DIAGNOSIS — M419 Scoliosis, unspecified: Secondary | ICD-10-CM | POA: Diagnosis present

## 2018-10-13 DIAGNOSIS — I5042 Chronic combined systolic (congestive) and diastolic (congestive) heart failure: Secondary | ICD-10-CM | POA: Diagnosis present

## 2018-10-13 DIAGNOSIS — F3178 Bipolar disorder, in full remission, most recent episode mixed: Secondary | ICD-10-CM | POA: Diagnosis not present

## 2018-10-13 DIAGNOSIS — F319 Bipolar disorder, unspecified: Secondary | ICD-10-CM | POA: Diagnosis present

## 2018-10-13 DIAGNOSIS — R410 Disorientation, unspecified: Secondary | ICD-10-CM | POA: Diagnosis present

## 2018-10-13 LAB — CBC WITH DIFFERENTIAL/PLATELET
Abs Immature Granulocytes: 0.03 10*3/uL (ref 0.00–0.07)
Basophils Absolute: 0 10*3/uL (ref 0.0–0.1)
Basophils Relative: 0 %
Eosinophils Absolute: 0.2 10*3/uL (ref 0.0–0.5)
Eosinophils Relative: 2 %
HCT: 36.3 % (ref 36.0–46.0)
Hemoglobin: 11.4 g/dL — ABNORMAL LOW (ref 12.0–15.0)
Immature Granulocytes: 0 %
Lymphocytes Relative: 17 %
Lymphs Abs: 1.4 10*3/uL (ref 0.7–4.0)
MCH: 30.6 pg (ref 26.0–34.0)
MCHC: 31.4 g/dL (ref 30.0–36.0)
MCV: 97.6 fL (ref 80.0–100.0)
Monocytes Absolute: 0.4 10*3/uL (ref 0.1–1.0)
Monocytes Relative: 5 %
Neutro Abs: 6.4 10*3/uL (ref 1.7–7.7)
Neutrophils Relative %: 76 %
Platelets: 181 10*3/uL (ref 150–400)
RBC: 3.72 MIL/uL — ABNORMAL LOW (ref 3.87–5.11)
RDW: 20.3 % — ABNORMAL HIGH (ref 11.5–15.5)
WBC: 8.4 10*3/uL (ref 4.0–10.5)
nRBC: 0 % (ref 0.0–0.2)

## 2018-10-13 LAB — BASIC METABOLIC PANEL
Anion gap: 12 (ref 5–15)
BUN: 24 mg/dL — ABNORMAL HIGH (ref 8–23)
CO2: 22 mmol/L (ref 22–32)
Calcium: 8.7 mg/dL — ABNORMAL LOW (ref 8.9–10.3)
Chloride: 105 mmol/L (ref 98–111)
Creatinine, Ser: 1.38 mg/dL — ABNORMAL HIGH (ref 0.44–1.00)
GFR calc Af Amer: 39 mL/min — ABNORMAL LOW (ref >60)
GFR calc non Af Amer: 34 mL/min — ABNORMAL LOW (ref >60)
Glucose, Bld: 121 mg/dL — ABNORMAL HIGH (ref 70–99)
Potassium: 3.8 mmol/L (ref 3.5–5.1)
Sodium: 139 mmol/L (ref 135–145)

## 2018-10-13 LAB — CBG MONITORING, ED: Glucose-Capillary: 65 mg/dL — ABNORMAL LOW (ref 70–99)

## 2018-10-13 MED ORDER — ONDANSETRON HCL 4 MG PO TABS: 4.0000 mg | ORAL_TABLET | Freq: Four times a day (QID) | ORAL | Status: DC | PRN

## 2018-10-13 MED ORDER — CARVEDILOL 3.125 MG PO TABS
6.2500 mg | ORAL_TABLET | Freq: Two times a day (BID) | ORAL | Status: DC
  Administered 2018-10-13 – 2018-10-18 (×11): 6.25 mg via ORAL
  Filled 2018-10-13 (×7): qty 2
  Filled 2018-10-13: qty 1
  Filled 2018-10-13 (×3): qty 2

## 2018-10-13 MED ORDER — SODIUM CHLORIDE 0.9 % IV SOLN
INTRAVENOUS | Status: DC
  Administered 2018-10-13: 08:00:00 via INTRAVENOUS

## 2018-10-13 MED ORDER — METRONIDAZOLE IN NACL 5-0.79 MG/ML-% IV SOLN
500.0000 mg | Freq: Three times a day (TID) | INTRAVENOUS | Status: DC
  Administered 2018-10-13 – 2018-10-18 (×16): 500 mg via INTRAVENOUS
  Filled 2018-10-13 (×16): qty 100

## 2018-10-13 MED ORDER — QUETIAPINE FUMARATE 25 MG PO TABS
50.0000 mg | ORAL_TABLET | Freq: Every day | ORAL | Status: DC
  Administered 2018-10-13 – 2018-10-15 (×4): 50 mg via ORAL
  Filled 2018-10-13 (×4): qty 2

## 2018-10-13 MED ORDER — TRAZODONE HCL 50 MG PO TABS
150.0000 mg | ORAL_TABLET | Freq: Every evening | ORAL | Status: DC | PRN
  Administered 2018-10-13 – 2018-10-15 (×3): 150 mg via ORAL
  Filled 2018-10-13 (×3): qty 3

## 2018-10-13 MED ORDER — SODIUM CHLORIDE 0.9 % IV SOLN: 250.0000 mL | INTRAVENOUS | Status: DC | PRN

## 2018-10-13 MED ORDER — CIPROFLOXACIN IN D5W 400 MG/200ML IV SOLN: 400.0000 mg | INTRAVENOUS | Status: DC

## 2018-10-13 MED ORDER — ACETAMINOPHEN 325 MG PO TABS
650.0000 mg | ORAL_TABLET | Freq: Four times a day (QID) | ORAL | Status: DC | PRN
  Administered 2018-10-13 – 2018-10-17 (×4): 650 mg via ORAL
  Filled 2018-10-13 (×5): qty 2

## 2018-10-13 MED ORDER — CIPROFLOXACIN IN D5W 400 MG/200ML IV SOLN
400.0000 mg | Freq: Once | INTRAVENOUS | Status: AC
  Administered 2018-10-13: 01:00:00 400 mg via INTRAVENOUS
  Filled 2018-10-13: qty 200

## 2018-10-13 MED ORDER — CIPROFLOXACIN IN D5W 400 MG/200ML IV SOLN
400.0000 mg | INTRAVENOUS | Status: DC
  Administered 2018-10-14 – 2018-10-16 (×3): 400 mg via INTRAVENOUS
  Filled 2018-10-13 (×3): qty 200

## 2018-10-13 MED ORDER — OMEPRAZOLE MAGNESIUM 20 MG PO TBEC: 20.0000 mg | DELAYED_RELEASE_TABLET | Freq: Every day | ORAL | Status: DC

## 2018-10-13 MED ORDER — SODIUM CHLORIDE 0.9% FLUSH
3.0000 mL | INTRAVENOUS | Status: DC | PRN
  Administered 2018-10-16: 21:00:00 3 mL via INTRAVENOUS
  Filled 2018-10-13: qty 3

## 2018-10-13 MED ORDER — ACETAMINOPHEN 650 MG RE SUPP: 650.0000 mg | Freq: Four times a day (QID) | RECTAL | Status: DC | PRN

## 2018-10-13 MED ORDER — SODIUM CHLORIDE 0.9% FLUSH
3.0000 mL | Freq: Two times a day (BID) | INTRAVENOUS | Status: DC
  Administered 2018-10-13 – 2018-10-18 (×9): 3 mL via INTRAVENOUS

## 2018-10-13 MED ORDER — PANTOPRAZOLE SODIUM 40 MG PO TBEC
40.0000 mg | DELAYED_RELEASE_TABLET | Freq: Every day | ORAL | Status: DC
  Administered 2018-10-13 – 2018-10-18 (×6): 40 mg via ORAL
  Filled 2018-10-13 (×6): qty 1

## 2018-10-13 MED ORDER — ONDANSETRON HCL 4 MG/2ML IJ SOLN: 4.0000 mg | Freq: Four times a day (QID) | INTRAMUSCULAR | Status: DC | PRN

## 2018-10-13 MED ORDER — MORPHINE SULFATE (PF) 2 MG/ML IV SOLN
1.0000 mg | INTRAVENOUS | Status: DC | PRN
  Administered 2018-10-13: 16:00:00 1 mg via INTRAVENOUS
  Administered 2018-10-13: 02:00:00 2 mg via INTRAVENOUS
  Administered 2018-10-14: 19:00:00 1 mg via INTRAVENOUS
  Administered 2018-10-14: 23:00:00 2 mg via INTRAVENOUS
  Administered 2018-10-14: 13:00:00 1 mg via INTRAVENOUS
  Administered 2018-10-15 (×2): 3 mg via INTRAVENOUS
  Administered 2018-10-15: 10:00:00 2 mg via INTRAVENOUS
  Filled 2018-10-13: qty 2
  Filled 2018-10-13: qty 1
  Filled 2018-10-13: qty 2
  Filled 2018-10-13 (×4): qty 1
  Filled 2018-10-13: qty 2

## 2018-10-13 NOTE — Progress Notes (Signed)
Pharmacy Antibiotic Note  Kimberly [name] is a 85 y.o. female admitted on 10/12/2018 with intar-abdominal infection.  Pharmacy has been consulted for  Cipro dosing.  Plan: Start Cipro 400mg  IV q24h Monitor labs, cultures, v/s and patient progress.  Height: 5\' 7"  (170.2 cm) Weight: 150 lb (68 kg) IBW/kg (Calculated) : 61.6  Temp (24hrs), Avg:97.9 F (36.6 C), Min:97.9 F (36.6 C), Max:97.9 F (36.6 C)  Recent Labs  Lab 10/12/18 1401 10/13/18 0205  WBC 9.9 8.4  CREATININE 1.30* 1.38*    Estimated Creatinine Clearance: 26.9 mL/min (A) (by C-G formula based on SCr of 1.38 mg/dL (H)).    Allergies  Allergen Reactions  . Ciprofloxacin Other (See Comments)    Possibly caused diarrhea November 2018  . Flagyl [Metronidazole] Other (See Comments)    Possibly caused diarrhea November 2018  . Iron Swelling    Ferrous Sulfate - tongue swelling   . Papaya Derivatives Hives  . Iodine Rash and Other (See Comments)    REACTION:If injected,  Rash/irritated skin reaction "welts"  . Penicillins Hives    DID THE REACTION INVOLVE: Swelling of the face/tongue/throat, SOB, or low BP? Unknown Sudden or severe rash/hives, skin peeling, or the inside of the mouth or nose? Yes Did it require medical treatment? Yes When did it last happen?68 or 85 years old If all above answers are "NO", may proceed with cephalosporin use.  . Sulfa Antibiotics Rash    Antimicrobials this admission: Cipro  6/10>>  metronidazole 6/10 >>  Levaquin 6/09>> 6/9   Dose adjustments this admission: Cipro   Microbiology results: no cultures  Ordered at this time  Thank you for allowing pharmacy to be a part of this patient's care.  Despina Pole 10/13/2018 7:38 AM

## 2018-10-13 NOTE — ED Provider Notes (Signed)
CONTINUING CARE FROM M. FAWZE, PA-C.  Patient is an 85 year old female who presents to the emergency department with rectal bleeding.  Patient has history of chronic congestive heart failure, atrial fibrillation on Eliquis, anxiety, and chronic kidney disease.  Patient is here because of increasing rectal bleeding.  Patient has right upper quadrant, right lower quadrant, and left lower quadrant abdominal pain.  Work-up has been reviewed up to this point.  CT scan pending.  CT scan reveals evidence of diverticulitis severe in the descending colon as well as the sigmoid colon.  There is also noted mild acute diverticulitis in the mid sigmoid.  There is no perforation or abscess appreciated at this time.  Patient started on IV antibiotics.  Case discussed with triad hospitalist.  The patient is to be admitted to the hospital for additional antibiotics and for monitoring of the bleeding.  Diagnosis: Acute diverticulitis with bleeding, patient on Eliquis.     CT reviewed.   Lily Kocher, PA-C 10/14/18 1301    Margette Fast, MD 10/16/18 2019

## 2018-10-13 NOTE — Progress Notes (Signed)
Pharmacy Antibiotic Note  Kimberly [name] is a 85 y.o. female admitted on 10/12/2018 with rectal bleeding/ Intra-abdominal Infection.  Pharmacy has been consulted for Ciprofloxacin dosing.  Plan: Ciprofloxacin 400mg  IV once now, then will dose at 400mg  IV q 18hr. Will ask the day pharmacist to review dose should Scr decrease. Furthermore, since Cipro apparently caused diarrhea in past, would recommend starting a probiotic in addition to antibiotics.  Height: 5\' 7"  (170.2 cm) Weight: 150 lb (68 kg) IBW/kg (Calculated) : 61.6  Temp (24hrs), Avg:97.9 F (36.6 C), Min:97.9 F (36.6 C), Max:97.9 F (36.6 C)  Recent Labs  Lab 10/12/18 1401  WBC 9.9  CREATININE 1.30*    Estimated Creatinine Clearance: 28.5 mL/min (A) (by C-G formula based on SCr of 1.3 mg/dL (H)).    Allergies  Allergen Reactions  . Ciprofloxacin Other (See Comments)    Possibly caused diarrhea November 2018  . Flagyl [Metronidazole] Other (See Comments)    Possibly caused diarrhea November 2018  . Iron Swelling    Ferrous Sulfate - tongue swelling   . Papaya Derivatives Hives  . Iodine Rash and Other (See Comments)    REACTION:If injected,  Rash/irritated skin reaction "welts"  . Penicillins Hives    DID THE REACTION INVOLVE: Swelling of the face/tongue/throat, SOB, or low BP? Unknown Sudden or severe rash/hives, skin peeling, or the inside of the mouth or nose? Yes Did it require medical treatment? Yes When did it last happen?42 or 85 years old If all above answers are "NO", may proceed with cephalosporin use.  . Sulfa Antibiotics Rash    Antimicrobials this admission: Metronidazole 500 mg once in ED on 6/9  Dose adjustments this admission: Ciprofloxacin 400 mg IV q 18hr for Crcl 5-29 ml/min. If Scr decreases, dose should be increased to 400 mg q 12hr.  Thank you for allowing pharmacy to be a part of this patient's care.  Wyline Mood 10/13/2018 12:25 AM

## 2018-10-13 NOTE — ED Notes (Signed)
Patient given toothbrush, toothpaste and gown. Patient was given food tray earlier. Patient resting comfortably at this time.

## 2018-10-13 NOTE — ED Notes (Addendum)
Pt is yelling at staff.  When asked what she needs, she says "I just want to know what the hell is going on?"  When enter her room she is yelling and not telling the staff what she needs.  Pt told me, "get the hell out."  I asked again what does she need and she again pointed to the door and said, "get the hell out."  Informed Magda Paganini, Therapist, sports

## 2018-10-13 NOTE — Progress Notes (Signed)
PROGRESS NOTE    Kimberly [name]  ERX:[id number] DOB: 01/13/1927 DOA: 10/12/2018 PCP: [name], DO   Brief Narrative:  HPI on 10/12/2018 by Dr. Mitzi Hansen Kimberly [name] is a 85 y.o. female with medical history significant for bipolar disorder, atrial fibrillation on Eliquis, chronic kidney disease stage III, anxiety, chronic diastolic CHF, and hypertension, now presenting to the emergency department for evaluation of lower abdominal pain and rectal bleeding.  Patient reports that she was recently evaluated at an outside hospital for these complaints, was diagnosed with acute diverticulitis, and due to her multiple listed drug allergies, she was started on doxycycline.  The patient reports continued, and worsening pain in the lower abdomen and increased rectal bleeding despite the antibiotic.  She denies any fevers or chills, denies nausea and vomiting, and denied lightheadedness or chest pain.  Assessment & Plan   Acute diverticulitis with bleeding and abdominal pain -Patient presented with lower abdominal pain and rectal bleeding and states that she was diagnosed with diverticulitis and started on doxycycline prior to admission.  Pain has been worsening. -Currently patient is afebrile -Hemoglobin 11.4 -FOBT + -CT abdomen pelvis severe diverticulosis of the descending and sigmoid colon.  Mild acute diverticulitis in the mid sigmoid, perhaps second site of active inflammation also in the mid descending colon.  No perforation, abscess or complicating features. -Eliquis currently held -Continue ciprofloxacin and Flagyl -Continue to monitor CBC -Continue IV pain control -Currently NPO -of note, patient had colonoscopy in 2018: Patient had 5 4 to 8 mm polyps in the rectum, sigmoid colon and proximal transverse colon, ascending colon, which were removed with a hot snare.  Diverticulosis in the rectosigmoid colon and sigmoid colon.  Internal hemorrhoids.  Hypotension -Responding to IV  fluids (on normal saline 50 cc/h) -Continue to monitor closely  Atrial fibrillation -CHADSVASC 5 (age, gender, CHF and hypertension) -Eliquis currently held given GI bleed -Continue Coreg with holding parameters  Chronic kidney disease, stage III -Creatinine currently 1.38, appears to be stable -Continue to monitor BMP  Chronic diastolic heart failure -Currently appears to be compensated and euvolemic.  She does have mild lower extremity swelling which has been there for months. -Lasix was held on admission -Monitor intake and output, daily weights  Bipolar disorder -Appears to be calm and cooperative -Continue Seroquel, trazodone  Essential hypertension -Patient with mild hypotension this morning. -Lasix held.  Placed holding parameters on Coreg -IV fluids started  DVT Prophylaxis  SCDs  Code Status: Full  Family Communication: None at bedside.  Disposition Plan: TBD. Admitted.  Given continued pain and hypotension, will place on IV fluids and continue IV antibiotics  Consultants None  Procedures  None  Antibiotics   Anti-infectives (From admission, onward)   Start     Dose/Rate Route Frequency Ordered Stop   10/14/18 0100  ciprofloxacin (CIPRO) IVPB 400 mg     400 mg 200 mL/hr over 60 Minutes Intravenous Every 24 hours 10/13/18 0805     10/13/18 1830  ciprofloxacin (CIPRO) IVPB 400 mg  Status:  Discontinued     400 mg 200 mL/hr over 60 Minutes Intravenous Every 18 hours 10/13/18 0035 10/13/18 0803   10/13/18 0730  metroNIDAZOLE (FLAGYL) IVPB 500 mg     500 mg 100 mL/hr over 60 Minutes Intravenous Every 8 hours 10/13/18 0048     10/13/18 0015  ciprofloxacin (CIPRO) IVPB 400 mg     400 mg 200 mL/hr over 60 Minutes Intravenous  Once 10/13/18 0003 10/13/18 0259  10/12/18 2330  levofloxacin (LEVAQUIN) IVPB 750 mg  Status:  Discontinued     750 mg 100 mL/hr over 90 Minutes Intravenous Every 24 hours 10/12/18 2325 10/12/18 2349   10/12/18 2330  metroNIDAZOLE  (FLAGYL) IVPB 500 mg     500 mg 100 mL/hr over 60 Minutes Intravenous  Once 10/12/18 2325 10/13/18 0133      Subjective:   Kimberly [name] seen and examined today.  Continues to complain of abdominal pain.  Denies current nausea or vomiting, chest pain, shortness of breath, dizziness or headache.  Objective:   Vitals:   10/13/18 0715 10/13/18 0730 10/13/18 0830 10/13/18 0900  BP:  (!) 87/44 116/83 135/68  Pulse: 70 69  70  Resp:    16  Temp:      TempSrc:      SpO2: 97% 96%  97%  Weight:      Height:        Intake/Output Summary (Last 24 hours) at 10/13/2018 1119 Last data filed at 10/13/2018 0259 Gross per 24 hour  Intake 300.9 ml  Output -  Net 300.9 ml   Filed Weights   10/12/18 1319  Weight: 68 kg    Exam  General: Well developed, elderly, NAD, appears stated age  HEENT: NCAT, mucous membranes moist.   Neck: Supple  Cardiovascular: S1 S2 auscultated,   Respiratory: Clear to auscultation bilaterally   Abdomen: Soft, RLQ TTP, nondistended, + bowel sounds  Extremities: warm dry without cyanosis clubbing. +Lower extremity edema, Left >right. Healing scar on LLE.   Neuro: AAOx3, nonfocal  Psych: Normal affect and demeanor, pleasant    Data Reviewed: I have personally reviewed following labs and imaging studies  CBC: Recent Labs  Lab 10/12/18 1401 10/13/18 0205  WBC 9.9 8.4  NEUTROABS  --  6.4  HGB 11.7* 11.4*  HCT 37.2 36.3  MCV 96.6 97.6  PLT 187 269   Basic Metabolic Panel: Recent Labs  Lab 10/12/18 1401 10/13/18 0205  NA 139 139  K 4.1 3.8  CL 102 105  CO2 25 22  GLUCOSE 87 121*  BUN 26* 24*  CREATININE 1.30* 1.38*  CALCIUM 9.0 8.7*   GFR: Estimated Creatinine Clearance: 26.9 mL/min (A) (by C-G formula based on SCr of 1.38 mg/dL (H)). Liver Function Tests: Recent Labs  Lab 10/12/18 1401  AST 26  ALT 15  ALKPHOS 62  BILITOT 0.7  PROT 7.1  ALBUMIN 4.1   No results for input(s): LIPASE, AMYLASE in the last 168 hours. No  results for input(s): AMMONIA in the last 168 hours. Coagulation Profile: No results for input(s): INR, PROTIME in the last 168 hours. Cardiac Enzymes: No results for input(s): CKTOTAL, CKMB, CKMBINDEX, TROPONINI in the last 168 hours. BNP (last 3 results) No results for input(s): PROBNP in the last 8760 hours. HbA1C: No results for input(s): HGBA1C in the last 72 hours. CBG: Recent Labs  Lab 10/13/18 0829  GLUCAP 65*   Lipid Profile: No results for input(s): CHOL, HDL, LDLCALC, TRIG, CHOLHDL, LDLDIRECT in the last 72 hours. Thyroid Function Tests: No results for input(s): TSH, T4TOTAL, FREET4, T3FREE, THYROIDAB in the last 72 hours. Anemia Panel: No results for input(s): VITAMINB12, FOLATE, FERRITIN, TIBC, IRON, RETICCTPCT in the last 72 hours. Urine analysis:    Component Value Date/Time   COLORURINE YELLOW 04/19/2018 [place] 04/19/2018 0523   LABSPEC 1.013 04/19/2018 0523   PHURINE 7.0 04/19/2018 0523   GLUCOSEU NEGATIVE 04/19/2018 0523   HGBUR NEGATIVE 04/19/2018  [place] 04/19/2018 0523   BILIRUBINUR neg 04/25/2014 0855   KETONESUR NEGATIVE 04/19/2018 0523   PROTEINUR NEGATIVE 04/19/2018 0523   UROBILINOGEN 0.2 05/26/2014 1415   NITRITE NEGATIVE 04/19/2018 0523   LEUKOCYTESUR TRACE (A) 04/19/2018 0523   Sepsis Labs: @LABRCNTIP (procalcitonin:4,lacticidven:4)  )No results found for this or any previous visit (from the past 240 hour(s)).    Radiology Studies: Ct Abdomen Pelvis Wo Contrast  Result Date: 10/12/2018 CLINICAL DATA:  85 year old female with abdominal pain for 3-4 days, rectal bleeding for 2-3 weeks. EXAM: CT ABDOMEN AND PELVIS WITHOUT CONTRAST TECHNIQUE: Multidetector CT imaging of the abdomen and pelvis was performed following the standard protocol without IV contrast. COMPARISON:  CT Abdomen and [facility] 04/07/2018, and earlier. FINDINGS: Lower chest: Chronic cardiomegaly. Cardiac pacemaker leads.  Chronic mild lung base scarring. No pericardial or pleural effusion. Hepatobiliary: Surgically absent gallbladder. Stable noncontrast liver with several small benign cysts or hemangiomas. Pancreas: Negative. Spleen: Negative. Adrenals/Urinary Tract: Normal adrenal glands. Punctate right nephrolithiasis is stable. No hydronephrosis. Proximal ureters are decompressed. Mild bilateral perinephric stranding is chronic. Diminutive and unremarkable urinary bladder. Stomach/Bowel: Negative rectum. Mild mesenteric stranding about the sigmoid colon with severe underlying diverticulosis (series 2, image 60). Indistinct appearance of the sigmoid in this region. No extraluminal gas or fluid. Diverticulosis continues in the descending colon with a 2nd area of questionable mild mesenteric inflammation on series 2, image 29. Redundant transverse colon also with diverticulosis, no active inflammation. No diverticulosis in the right colon. Negative terminal ileum. No dilated small bowel. Small to moderate gastric hiatal hernia is stable. No free air, free fluid. Vascular/Lymphatic: Extensive Aortoiliac calcified atherosclerosis. Vascular patency is not evaluated in the absence of IV contrast. No lymphadenopathy. Reproductive: Surgically absent. Other: No pelvic free fluid. Musculoskeletal: Osteopenia, scoliosis, spine degeneration. No acute osseous abnormality identified. IMPRESSION: 1. Severe diverticulosis of the descending and sigmoid colon. Mild Acute Diverticulitis in the mid sigmoid, and perhaps a 2nd site of active inflammation also in the mid descending colon. No perforation, abscess or complicating features. 2. No other acute or inflammatory process in the abdomen or pelvis. 3.  Aortic Atherosclerosis (ICD10-I70.0). Electronically Signed   By: Genevie Ann M.D.   On: 10/12/2018 22:53     Scheduled Meds: . carvedilol  6.25 mg Oral BID WC  . pantoprazole  40 mg Oral Daily  . QUEtiapine  50 mg Oral QHS  . sodium chloride  flush  3 mL Intravenous Q12H   Continuous Infusions: . sodium chloride    . sodium chloride 50 mL/hr at 10/13/18 0829  . [START ON 10/14/2018] ciprofloxacin    . metronidazole 500 mg (10/13/18 0833)     LOS: 0 days   Time Spent in minutes   45 minutes  Kasi Lasky D.O. on 10/13/2018 at 11:19 AM  Between 7am to 7pm - Please see pager noted on [email or web address]  After 7pm go to [email or web address]  And look for the night coverage person covering for me after hours  [facility] Office  [phone]

## 2018-10-13 NOTE — Telephone Encounter (Signed)
Sharon reached out regarding [facility] referral for community nurse assessment. She will schedule her with Jacqlyn Larsen.   [name], RN-BC, BSN Nurse Care Manager [facility] [phone]

## 2018-10-14 ENCOUNTER — Ambulatory Visit (INDEPENDENT_AMBULATORY_CARE_PROVIDER_SITE_OTHER): Payer: Medicare Other | Admitting: Licensed Clinical Social Worker

## 2018-10-14 DIAGNOSIS — I10 Essential (primary) hypertension: Secondary | ICD-10-CM | POA: Diagnosis not present

## 2018-10-14 DIAGNOSIS — K279 Peptic ulcer, site unspecified, unspecified as acute or chronic, without hemorrhage or perforation: Secondary | ICD-10-CM

## 2018-10-14 DIAGNOSIS — K5792 Diverticulitis of intestine, part unspecified, without perforation or abscess without bleeding: Secondary | ICD-10-CM

## 2018-10-14 DIAGNOSIS — F331 Major depressive disorder, recurrent, moderate: Secondary | ICD-10-CM | POA: Diagnosis not present

## 2018-10-14 DIAGNOSIS — I4821 Permanent atrial fibrillation: Secondary | ICD-10-CM | POA: Diagnosis not present

## 2018-10-14 DIAGNOSIS — F3178 Bipolar disorder, in full remission, most recent episode mixed: Secondary | ICD-10-CM | POA: Diagnosis not present

## 2018-10-14 DIAGNOSIS — K219 Gastro-esophageal reflux disease without esophagitis: Secondary | ICD-10-CM

## 2018-10-14 LAB — BASIC METABOLIC PANEL
Anion gap: 10 (ref 5–15)
BUN: 18 mg/dL (ref 8–23)
CO2: 21 mmol/L — ABNORMAL LOW (ref 22–32)
Calcium: 8.4 mg/dL — ABNORMAL LOW (ref 8.9–10.3)
Chloride: 107 mmol/L (ref 98–111)
Creatinine, Ser: 1.27 mg/dL — ABNORMAL HIGH (ref 0.44–1.00)
GFR calc Af Amer: 43 mL/min — ABNORMAL LOW (ref >60)
GFR calc non Af Amer: 37 mL/min — ABNORMAL LOW (ref >60)
Glucose, Bld: 103 mg/dL — ABNORMAL HIGH (ref 70–99)
Potassium: 4.2 mmol/L (ref 3.5–5.1)
Sodium: 138 mmol/L (ref 135–145)

## 2018-10-14 LAB — CBC
HCT: 32.9 % — ABNORMAL LOW (ref 36.0–46.0)
Hemoglobin: 10.3 g/dL — ABNORMAL LOW (ref 12.0–15.0)
MCH: 30.5 pg (ref 26.0–34.0)
MCHC: 31.3 g/dL (ref 30.0–36.0)
MCV: 97.3 fL (ref 80.0–100.0)
Platelets: 167 10*3/uL (ref 150–400)
RBC: 3.38 MIL/uL — ABNORMAL LOW (ref 3.87–5.11)
RDW: 20.2 % — ABNORMAL HIGH (ref 11.5–15.5)
WBC: 6.9 10*3/uL (ref 4.0–10.5)
nRBC: 0 % (ref 0.0–0.2)

## 2018-10-14 LAB — NOVEL CORONAVIRUS, NAA (HOSP ORDER, SEND-OUT TO REF LAB; TAT 18-24 HRS): SARS-CoV-2, NAA: NOT DETECTED

## 2018-10-14 MED ORDER — LORAZEPAM 2 MG/ML IJ SOLN
0.5000 mg | Freq: Four times a day (QID) | INTRAMUSCULAR | Status: DC | PRN
  Administered 2018-10-15: 0.5 mg via INTRAVENOUS
  Filled 2018-10-14: qty 1

## 2018-10-14 NOTE — Progress Notes (Signed)
Patient sitting on bed, got up to use the [facility] without calling for assistance.  Patient was upset the bed alarm was on.  Explained to patient the importance on the bed alarm.  Patient is alert and oriented at this time.  Patient refused bed alarm and stated that she lived alone and was able to care for herself.  She stated that she had urinary urgency and that when she had to urinate she had to urinate and did not want alarms going on when she had to use the restroom. Again encouraged bed alarm for patient safety, patient refused.  This refusal was witnessed by charge nurse and patient staff nurse.

## 2018-10-14 NOTE — TOC Initial Note (Signed)
Transition of Care [facility]) - Initial/Assessment Note    Patient Details  Name: Kimberly [name] MRN: [id number] Date of Birth: 12-Nov-1926  Transition of Care [facility]) CM/SW Contact:    Ihor Gully, [name] Phone Number: 10/14/2018, 2:25 PM  Clinical Narrative:                 Patient reports being independent in his ADLs. She states that [facility] is active and she is thankful for their involvement. She states that she plans on discharging home and is not in any way interested in placement despite placement not being brought up.  [place], [facility] APS, contacted [name] and indicated that they had recently received a report regarding patient being confused and having a wound on her ankle. [name] provided patient's room number.   Review of chart details from [place] indicated:    Client has some transport needs. She has reported to PCP that she needs occasional help in the home.She has history of mental health diagnoses (Major Depressive Disorder, Bipolar Disorder) She has talked with Dr. Lajuana Ripple recently about client use of Seroquel. Client could also be at some risk for social isolation. Her children live out of the local area.  Client said she needed help in cleaning her home. She said she has no problem with meal preparation. She said she does well with bathing and does well with ADLs . She said she has a walk in tub.This walk in tub is very helpful to client. Client uses a walker to help her ambulate. She has her prescribed medications and is taking medications as prescribed. She said she likes to be independent and do things on her own. She does not want to go to Assisted Living facility. She said her son lives in [place]. Her daughter lives in [place] and travels a good deal. Client said she has a pacemaker and sees a cardiologist in [place], [name]. She agreed for [facility] to call her to talk further about CCM program services  Expected Discharge Plan: [place] Barriers to  Discharge: No Barriers Identified   Patient Goals and CMS Choice Patient states their goals for this hospitalization and ongoing recovery are:: To remain independent and return to her home. CMS Medicare.gov Compare Post Acute Care list provided to:: Patient Choice offered to / list presented to : Patient  Expected Discharge Plan and Services Expected Discharge Plan: [place] Choice: Durable Medical Equipment Living arrangements for the past 2 months: Single Family Home Expected Discharge Date: 10/15/18                                    Prior Living Arrangements/Services Living arrangements for the past 2 months: Single Family Home Lives with:: Self Patient language and need for interpreter reviewed:: Yes Do you feel safe going back to the place where you live?: Yes      Need for Family Participation in Patient Care: Yes (Comment) Care giver support system in place?: Yes (comment) Current home services: DME Criminal Activity/Legal Involvement Pertinent to Current Situation/Hospitalization: No - Comment as needed  Activities of Daily Living Home Assistive Devices/Equipment: Walker (specify type), Eyeglasses, Bedside commode/3-in-1 ADL Screening (condition at time of admission) Patient's cognitive ability adequate to safely complete daily activities?: Yes Is the patient deaf or have difficulty hearing?: No Does the patient have difficulty seeing, even  when wearing glasses/contacts?: No Does the patient have difficulty concentrating, remembering, or making decisions?: No Patient able to express need for assistance with ADLs?: Yes Does the patient have difficulty dressing or bathing?: No Independently performs ADLs?: Yes (appropriate for developmental age) Does the patient have difficulty walking or climbing stairs?: Yes Weakness of Legs: Both Weakness of Arms/Hands: Both  Permission Sought/Granted Permission sought to share  information with : PCP Permission granted to share information with : Yes, Verbal Permission Granted              Emotional Assessment Appearance:: Appears stated age   Affect (typically observed): Accepting Orientation: : Oriented to Self, Oriented to Place, Oriented to  Time, Oriented to Situation Alcohol / Substance Use: Not Applicable Psych Involvement: No (comment)  Admission diagnosis:  Acute GI bleeding [K92.2] Lower abdominal pain [R10.30] Acute diverticulitis [K57.92] Patient Active Problem List   Diagnosis Date Noted  . Acute GI bleeding   . Enteritis of small intestine due to enterotoxigenic Escherichia coli associated with diarrhea 03/24/2017  . Bipolar affective disorder ([place]) 03/23/2017  . Olfactory hallucinations 03/23/2017  . Lower GI bleed   . Glaucoma 02/28/2017  . Pacemaker 03/21/2015  . Non-ischemic cardiomyopathy ([place]) 11/28/2014  . PUD (peptic ulcer disease) 06/14/2014  . History of bipolar disorder   . Hypokalemia 05/26/2014  . Fall at home 05/26/2014  . Chronic diastolic heart failure ([place]) 11/15/2013  . Complete heart block ([place]) 11/15/2013  . Inability to ambulate due to ankle or foot 09/05/2013  . Unspecified constipation 08/26/2013  . Anemia 08/25/2013  . Acute diverticulitis 08/24/2013  . CKD (chronic kidney disease) stage 3, GFR 30-59 ml/min (HCC) 10/12/2012  . Chronic anticoagulation 10/12/2012  . Osteoarthritis   . Permanent atrial fibrillation   . Biventricular cardiac pacemaker - [employer] Consulta   . Fibromyalgia   . Essential hypertension   . Depression    PCP:  [name], DO Pharmacy:   [facility], [name] [address] Phone: [phone] Fax: [place], [place] [facility] [address] [place] [address] [place] [address] Phone: [phone] Fax: [phone]     Social Determinants of Health  ([place]) Interventions    Readmission Risk Interventions No flowsheet data found.

## 2018-10-14 NOTE — Plan of Care (Signed)

## 2018-10-14 NOTE — Progress Notes (Signed)
Writer called to room by patient, pt sitting on the side of the bed with no clothing. Pt has pulled out EJ, states that she must have had a nightmare and "accidentally" pulled out EJ. Staff attempts to clean up pt and assist to restroom, pt noted refusing staff assistance, yelling and accusing staff of "hiding evidence". Refuses to have another IV placed.

## 2018-10-14 NOTE — Patient Instructions (Signed)
Licensed Clinical Social Worker Visit Information  Materials provided: No  Ms. Hertenstein was given information about Chronic Care Management services today including:  1. CCM service includes personalized support from designated clinical staff supervised by her physician, including individualized plan of care and coordination with other care providers 2. 24/7 contact phone numbers for assistance for urgent and routine care needs. 3. Service will only be billed when office clinical staff spend 20 minutes or more in a month to coordinate care. 4. Only one practitioner may furnish and bill the service in a calendar month. 5. The patient may stop CCM services at any time (effective at the end of the month) by phone call to the office staff. 6. The patient will be responsible for cost sharing (co-pay) of up to 20% of the service fee (after annual deductible is met).  Patient agreed to services and verbal consent obtained.      Patient Goal: I want to talk with someone about anxiety and stress about managing my health conditions (patient stated)  Barriers:    Transportation Social Isolation Mental Health challenges  Clinical Social Work Goal:  [name] will talk with client in next 30 days to discuss anxiety and stress issues about managing her health conditions.  Interventions:   Talked with client about CCM program support with [name] and RNCM Talked with client about current social work needs of client  Talked with client about social support network of client Talked with client about current medical needs of client and upcoming client procedure at [place]  Takes medications as prescribed Talks to PCP as needed about medical needs of client. Attends medical appointments as scheduled  Plan:   Client to call [name] to discuss social work needs of client Client to talk with RNCM about nursing needs of client [name] to call client in next 2 weeks to assess  psychosocial needs of client   Follow Up Plan: [name] to call client in next 2 weeks to assess psychosocial needs of client.  The patient verbalized understanding of instructions provided today and declined a print copy of patient instruction materials.   [name].Frady Taddeo MSW, [name] Licensed Clinical Social Worker [facility] Care Management [phone]

## 2018-10-14 NOTE — Chronic Care Management (AMB) (Signed)
Chronic Care Management    Clinical Social Work CCM Outreach Note  10/14/2018 Name: Kimberly [name] MRN: [id number] DOB: 1926-10-30  Kimberly [name] is a 85 y.o. year old female who is a primary care patient of Kimberly Norlander, DO . The CCM team was consulted for assistance with assessment of psychosocial needs of client.   [name] reached out to Kimberly [name] today by phone to introduce and offer CCM services.   Kimberly [name] was given information about Chronic Care Management services today including:  1. CCM service includes personalized support from designated clinical staff supervised by her physician, including individualized plan of care and coordination with other care providers 2. 24/7 contact phone numbers for assistance for urgent and routine care needs. 3. Service will only be billed when office clinical staff spend 20 minutes or more in a month to coordinate care. 4. Only one practitioner may furnish and bill the service in a calendar month. 5. The patient may stop CCM services at any time (effective at the end of the month) by phone call to the office staff. 6. The patient will be responsible for cost sharing (co-pay) of up to 20% of the service fee (after annual deductible is met).  Patient agreed to services and verbal consent obtained.    Social Determinants of Health: Client is at risk for social isolation. Has Mental Health challenges  Client has some transport needs. She has reported to PCP that she needs occasional help in the home.She has history of mental health diagnoses (Major Depressive Disorder, Bipolar Disorder) She has talked with Kimberly [name] recently about client use of Seroquel.  Client could also be at some risk for social isolation.  Her children live out of the local area.  Client said she needed help in cleaning her home.  She said she has no problem with meal preparation. She said she does well with bathing and does well with ADLs . She said she has a walk in  tub.This walk in tub is very helpful to client. Client uses a walker to help her ambulate. She has her prescribed medications and is taking medications as prescribed. She said she likes to be independent and do things on her own. She does not want to go to Assisted Living facility. She said her son lives in [place]. Her daughter lives in [place] and travels a good deal. Client said she has a pacemaker and sees a cardiologist in [place], [name]. She agreed for [facility] to call her to talk further about CCM program services.  Patient Goal: I want to talk with someone about anxiety and stress about managing my health conditions (patient stated)  Barriers:    Transportation Social Isolation Mental Health challenges  Clinical Social Work Goal:  [name] will talk with client in next 30 days to discuss anxiety and stress issues about managing her health conditions.  Interventions:   Talked with client about CCM program support with [name] and RNCM Talked with client about current social work needs of client  Talked with client about social support network of client Talked with client about current medical needs of client and upcoming client procedure at [place]  Takes medications as prescribed Talks to PCP as needed about medical needs of client. Attends medical appointments as scheduled  Plan:   Client to call [name] to discuss social work needs of client Client to talk with [facility] about nursing needs of client [name] to call client in  next 2 weeks to assess psychosocial needs of client   Follow Up Plan: [name] to call client in next 2 weeks to assess psychosocial needs of client.  [name].Kimberly [name] MSW, [name] Licensed Clinical Social Worker [facility] Care [facility] [phone]

## 2018-10-14 NOTE — Progress Notes (Signed)
Patient Demographics:    Kimberly [name], is a 85 y.o. female, DOB - 01/18/1927, EHU:[id number]  Admit date - 10/12/2018   Admitting Physician Vianne Bulls, MD  Outpatient Primary MD for the patient is [name], DO  LOS - 1   Chief Complaint  Patient presents with   Rectal Bleeding        Subjective:    Kimberly [name] today has no fevers, no emesis,  No chest pain,  episodes of confusion disorientation and agitation from time to time----overnight patient got confused and pulled out the IV and refused to allow staff to restart IV... More cooperative at this time    Assessment  & Plan :    Principal Problem:   Acute diverticulitis Active Problems:   Permanent atrial fibrillation   Essential hypertension   CKD (chronic kidney disease) stage 3, GFR 30-59 ml/min (HCC)   Chronic diastolic heart failure (HCC)   History of bipolar disorder   Lower GI bleed  Brief summary 85 year old female with past medical history relevant for bipolar disorder, atrial fibrillation on chronic anticoagulation with Eliquis, diastolic CHF, CKD 3, hypertension and anxiety disorder admitted with abdominal pain and rectal bleeding on 10/12/2018 and found to have diverticulitis  A/p 1)Acute diverticulitis with bleeding--- CT findings noted, no abscess or perforation, apparently failed outpatient doxycycline, continue Flagyl and Cipro  2) chronic atrial fibrillation--very high stroke risk with [place] VaSc score  of 5, Eliquis on hold due to GI bleed as above #1.Kimberly KitchenMarland [name]   3)HFpEF--- no acute exacerbation of diastolic dysfunction CHF at this time, Lasix is on hold until oral intake improves, be judicious with fluids, continue Coreg  4) bipolar disorder with confusional episodes--- continue Seroquel and trazodone may use lorazepam as needed agitation  5)CKD III--- 1 nephrotoxic agents monitor renal function  closely  Disposition/Need for in-Hospital Stay- patient unable to be discharged at this time due to requiring IV antibiotics for acute diverticulitis with bleeding, may need transfusion of packed red blood cells*  Code Status : Full  Family Communication:   [name] as I was unable to reach her daughter or her son, left messages for daughter  Disposition Plan  : TBD  Consults  :  na  DVT Prophylaxis  :  - SCDs  (GI bleed)  Lab Results  Component Value Date   PLT 167 10/14/2018    Inpatient Medications  Scheduled Meds:  carvedilol  6.25 mg Oral BID WC   pantoprazole  40 mg Oral Daily   QUEtiapine  50 mg Oral QHS   sodium chloride flush  3 mL Intravenous Q12H   Continuous Infusions:  sodium chloride     sodium chloride 50 mL/hr at 10/13/18 0829   ciprofloxacin 400 mg (10/14/18 0130)   metronidazole 500 mg (10/14/18 1503)   PRN Meds:.sodium chloride, acetaminophen **OR** acetaminophen, LORazepam, morphine injection, ondansetron **OR** ondansetron (ZOFRAN) IV, sodium chloride flush, traZODone    Anti-infectives (From admission, onward)   Start     Dose/Rate Route Frequency Ordered Stop   10/14/18 0100  ciprofloxacin (CIPRO) IVPB 400 mg     400 mg 200 mL/hr over 60 Minutes Intravenous Every 24 hours 10/13/18 0805     10/13/18 1830  ciprofloxacin (CIPRO) IVPB  400 mg  Status:  Discontinued     400 mg 200 mL/hr over 60 Minutes Intravenous Every 18 hours 10/13/18 0035 10/13/18 0803   10/13/18 0730  metroNIDAZOLE (FLAGYL) IVPB 500 mg     500 mg 100 mL/hr over 60 Minutes Intravenous Every 8 hours 10/13/18 0048     10/13/18 0015  ciprofloxacin (CIPRO) IVPB 400 mg     400 mg 200 mL/hr over 60 Minutes Intravenous  Once 10/13/18 0003 10/13/18 0259   10/12/18 2330  levofloxacin (LEVAQUIN) IVPB 750 mg  Status:  Discontinued     750 mg 100 mL/hr over 90 Minutes Intravenous Every 24 hours 10/12/18 2325 10/12/18 2349   10/12/18 2330  metroNIDAZOLE (FLAGYL) IVPB 500 mg      500 mg 100 mL/hr over 60 Minutes Intravenous  Once 10/12/18 2325 10/13/18 0133        Objective:   Vitals:   10/13/18 2211 10/14/18 0541 10/14/18 0800 10/14/18 1513  BP: 118/74 (!) 122/53  112/62  Pulse: 80 74  76  Resp: 20 17  19   Temp: 98.1 F (36.7 C) 98.2 F (36.8 C)  98.1 F (36.7 C)  TempSrc: Oral Oral  Oral  SpO2: 99% 99% 99% 99%  Weight:      Height:        Wt Readings from Last 3 Encounters:  10/13/18 64.1 kg  06/02/18 68.7 kg  05/06/18 66.7 kg     Intake/Output Summary (Last 24 hours) at 10/14/2018 1814 Last data filed at 10/14/2018 1758 Gross per 24 hour  Intake 1802.8 ml  Output 301 ml  Net 1501.8 ml     Physical Exam    Gen:- Awake Alert, more calm HEENT:- [place].AT, No sclera icterus Neck-Supple Neck,No JVD,.  Lungs-  CTAB , fair symmetrical air movement CV- S1, S2 normal, irregularly irregular Abd-  +ve B.Sounds, Abd Soft, epigastric and lower abdominal area tenderness without rebound or guarding Extremity/Skin:- No  edema, pedal pulses present  Psych-affect is appropriate, oriented x3, episodes of confusion disorientation and agitation from time to time Neuro-no new focal deficits, no tremors   Data Review:   Micro Results Recent Results (from the past 240 hour(s))  Novel Coronavirus,NAA,(SEND-OUT TO REF LAB - TAT 24-48 hrs); Hosp Order     Status: None   Collection Time: 10/12/18 10:45 PM   Specimen: Nasopharyngeal Swab; Respiratory  Result Value Ref Range Status   SARS-CoV-2, NAA NOT DETECTED NOT DETECTED Final    Comment: (NOTE) This test was developed and its performance characteristics determined by [employer]. This test has not been FDA cleared or approved. This test has been authorized by FDA under an Emergency Use Authorization (EUA). This test is only authorized for the duration of time the declaration that circumstances exist justifying the authorization of the emergency use of in vitro diagnostic tests for detection  of SARS-CoV-2 virus and/or diagnosis of COVID-19 infection under section 564(b)(1) of the Act, 21 U.S.C. 001VCB-4(W)(9), unless the authorization is terminated or revoked sooner. When diagnostic testing is negative, the possibility of a false negative result should be considered in the context of a patient's recent exposures and the presence of clinical signs and symptoms consistent with COVID-19. An individual without symptoms of COVID-19 and who is not shedding SARS-CoV-2 virus would expect to have a negative (not detected) result in this assay. Performed  At: [facility] [address] [place], [name] [id number] Rush Farmer MD YK:[id number]    [place]  Final  Comment: Performed at [facility], [address] [place], [place] [address]    Radiology Reports Ct Abdomen Pelvis Wo Contrast  Result Date: 10/12/2018 CLINICAL DATA:  85 year old female with abdominal pain for 3-4 days, rectal bleeding for 2-3 weeks. EXAM: CT ABDOMEN AND PELVIS WITHOUT CONTRAST TECHNIQUE: Multidetector CT imaging of the abdomen and pelvis was performed following the standard protocol without IV contrast. COMPARISON:  CT Abdomen and [facility] 04/07/2018, and earlier. FINDINGS: Lower chest: Chronic cardiomegaly. Cardiac pacemaker leads. Chronic mild lung base scarring. No pericardial or pleural effusion. Hepatobiliary: Surgically absent gallbladder. Stable noncontrast liver with several small benign cysts or hemangiomas. Pancreas: Negative. Spleen: Negative. Adrenals/Urinary Tract: Normal adrenal glands. Punctate right nephrolithiasis is stable. No hydronephrosis. Proximal ureters are decompressed. Mild bilateral perinephric stranding is chronic. Diminutive and unremarkable urinary bladder. Stomach/Bowel: Negative rectum. Mild mesenteric stranding about the sigmoid colon with severe underlying diverticulosis (series 2, image 60). Indistinct appearance of the  sigmoid in this region. No extraluminal gas or fluid. Diverticulosis continues in the descending colon with a 2nd area of questionable mild mesenteric inflammation on series 2, image 29. Redundant transverse colon also with diverticulosis, no active inflammation. No diverticulosis in the right colon. Negative terminal ileum. No dilated small bowel. Small to moderate gastric hiatal hernia is stable. No free air, free fluid. Vascular/Lymphatic: Extensive Aortoiliac calcified atherosclerosis. Vascular patency is not evaluated in the absence of IV contrast. No lymphadenopathy. Reproductive: Surgically absent. Other: No pelvic free fluid. Musculoskeletal: Osteopenia, scoliosis, spine degeneration. No acute osseous abnormality identified. IMPRESSION: 1. Severe diverticulosis of the descending and sigmoid colon. Mild Acute Diverticulitis in the mid sigmoid, and perhaps a 2nd site of active inflammation also in the mid descending colon. No perforation, abscess or complicating features. 2. No other acute or inflammatory process in the abdomen or pelvis. 3.  Aortic Atherosclerosis (ICD10-I70.0). Electronically Signed   By: Genevie Ann M.D.   On: 10/12/2018 22:53     CBC Recent Labs  Lab 10/12/18 1401 10/13/18 0205 10/14/18 0616  WBC 9.9 8.4 6.9  HGB 11.7* 11.4* 10.3*  HCT 37.2 36.3 32.9*  PLT 187 181 167  MCV 96.6 97.6 97.3  MCH 30.4 30.6 30.5  MCHC 31.5 31.4 31.3  RDW 20.7* 20.3* 20.2*  LYMPHSABS  --  1.4  --   MONOABS  --  0.4  --   EOSABS  --  0.2  --   BASOSABS  --  0.0  --     Chemistries  Recent Labs  Lab 10/12/18 1401 10/13/18 0205 10/14/18 0616  NA 139 139 138  K 4.1 3.8 4.2  CL 102 105 107  CO2 25 22 21*  GLUCOSE 87 121* 103*  BUN 26* 24* 18  CREATININE 1.30* 1.38* 1.27*  CALCIUM 9.0 8.7* 8.4*  AST 26  --   --   ALT 15  --   --   ALKPHOS 62  --   --   BILITOT 0.7  --   --     ------------------------------------------------------------------------------------------------------------------ No results for input(s): CHOL, HDL, LDLCALC, TRIG, CHOLHDL, LDLDIRECT in the last 72 hours.  Lab Results  Component Value Date   HGBA1C 5.8 (H) 12/15/2011   ------------------------------------------------------------------------------------------------------------------ No results for input(s): TSH, T4TOTAL, T3FREE, THYROIDAB in the last 72 hours.  Invalid input(s): FREET3 ------------------------------------------------------------------------------------------------------------------ No results for input(s): VITAMINB12, FOLATE, FERRITIN, TIBC, IRON, RETICCTPCT in the last 72 hours.  Coagulation profile No results for input(s): INR, PROTIME in the last 168 hours.  No results  for input(s): DDIMER in the last 72 hours.  Cardiac Enzymes No results for input(s): CKMB, TROPONINI, MYOGLOBIN in the last 168 hours.  Invalid input(s): CK ------------------------------------------------------------------------------------------------------------------    Component Value Date/Time   BNP 270.0 (H) 04/25/2018 2005     Roxan Hockey M.D on 10/14/2018 at 6:14 PM  Go to [email or web address] - for contact info  [facility] - Office  [phone]

## 2018-10-15 LAB — GLUCOSE, CAPILLARY: Glucose-Capillary: 79 mg/dL (ref 70–99)

## 2018-10-15 NOTE — Progress Notes (Signed)
Patient Demographics:    Kimberly [name], is a 85 y.o. female, DOB - [birth year], OEV:[id number]  Admit date - 10/12/2018   Admitting Physician Vianne Bulls, MD  Outpatient Primary MD for the patient is [name], DO  LOS - 2   Chief Complaint  Patient presents with   Rectal Bleeding        Subjective:    Kimberly [name] today has no fevers, no emesis, patient would like to eat more, has some bloody stool, abdomen is less painful  Assessment  & Plan :    Principal Problem:   Acute diverticulitis Active Problems:   Permanent atrial fibrillation   Essential hypertension   CKD (chronic kidney disease) stage 3, GFR 30-59 ml/min (HCC)   Chronic diastolic heart failure (HCC)   History of bipolar disorder   Lower GI bleed  Brief Summary 85 year old female with past medical history relevant for bipolar disorder, atrial fibrillation on chronic anticoagulation with Eliquis, diastolic CHF, CKD 3, hypertension and anxiety disorder admitted with abdominal pain and rectal bleeding on 10/12/2018 and found to have diverticulitis  A/p 1)Acute diverticulitis with bleeding--- continues to have abdominal pain and blood in stool, CT findings noted, no abscess or perforation, apparently failed outpatient doxycycline, continue Flagyl and Cipro, repeat CBC in a.m.  2)Chronic Atrial Fibrillation--very high stroke risk with [place] VaSc score  of 5, Eliquis on hold due to GI bleed as above #1.[name] KitchenMarland Kitchen   3)HFpEF--- no acute exacerbation of diastolic dysfunction CHF at this time, Lasix is on hold until oral intake improves, be judicious with fluids, continue Coreg  4)Bipolar Disorder with confusional episodes--- continue Seroquel and trazodone may use lorazepam as needed agitation  5)CKD III--- 1 nephrotoxic agents monitor renal function closely  Disposition/Need for in-Hospital Stay- patient unable to be discharged at this  time due to requiring IV antibiotics for acute diverticulitis with bleeding, may need transfusion of packed red blood cells*  Code Status : Full  Family Communication:  D/w her neighbor/Friend Ms [place] as I was unable to reach her daughter or her son, left messages for daughter  Disposition Plan  : TBD  Consults  :  na  DVT Prophylaxis  :  - SCDs/TEDs  (GI bleed)  Lab Results  Component Value Date   PLT 167 10/14/2018    Inpatient Medications  Scheduled Meds:  carvedilol  6.25 mg Oral BID WC   pantoprazole  40 mg Oral Daily   QUEtiapine  50 mg Oral QHS   sodium chloride flush  3 mL Intravenous Q12H   Continuous Infusions:  sodium chloride     sodium chloride 50 mL/hr at 10/13/18 0829   ciprofloxacin 400 mg (10/15/18 0156)   metronidazole 500 mg (10/15/18 0623)   PRN Meds:.sodium chloride, acetaminophen **OR** acetaminophen, LORazepam, morphine injection, ondansetron **OR** ondansetron (ZOFRAN) IV, sodium chloride flush, traZODone    Anti-infectives (From admission, onward)   Start     Dose/Rate Route Frequency Ordered Stop   10/14/18 0100  ciprofloxacin (CIPRO) IVPB 400 mg     400 mg 200 mL/hr over 60 Minutes Intravenous Every 24 hours 10/13/18 0805     10/13/18 1830  ciprofloxacin (CIPRO) IVPB 400 mg  Status:  Discontinued     400  mg 200 mL/hr over 60 Minutes Intravenous Every 18 hours 10/13/18 0035 10/13/18 0803   10/13/18 0730  metroNIDAZOLE (FLAGYL) IVPB 500 mg     500 mg 100 mL/hr over 60 Minutes Intravenous Every 8 hours 10/13/18 0048     10/13/18 0015  ciprofloxacin (CIPRO) IVPB 400 mg     400 mg 200 mL/hr over 60 Minutes Intravenous  Once 10/13/18 0003 10/13/18 0259   10/12/18 2330  levofloxacin (LEVAQUIN) IVPB 750 mg  Status:  Discontinued     750 mg 100 mL/hr over 90 Minutes Intravenous Every 24 hours 10/12/18 2325 10/12/18 2349   10/12/18 2330  metroNIDAZOLE (FLAGYL) IVPB 500 mg     500 mg 100 mL/hr over 60 Minutes Intravenous  Once  10/12/18 2325 10/13/18 0133        Objective:   Vitals:   10/14/18 1513 10/14/18 2128 10/15/18 0519 10/15/18 0519  BP: 112/62 125/71 138/79 138/79  Pulse: 76 74 87 87  Resp: 19 18 19 19   Temp: 98.1 F (36.7 C) 98.6 F (37 C) 97.8 F (36.6 C) 97.8 F (36.6 C)  TempSrc: Oral Oral Oral Oral  SpO2: 99% 100% 100% 100%  Weight:    67.3 kg  Height:        Wt Readings from Last 3 Encounters:  10/15/18 67.3 kg  06/02/18 68.7 kg  05/06/18 66.7 kg     Intake/Output Summary (Last 24 hours) at 10/15/2018 1422 Last data filed at 10/15/2018 0900 Gross per 24 hour  Intake 240 ml  Output --  Net 240 ml     Physical Exam    Gen:- Awake Alert, in no apparent distress  HEENT:- .AT, No sclera icterus Neck-Supple Neck,No JVD,.  Lungs-  CTAB , fair symmetrical air movement CV- S1, S2 normal, irregularly irregular Abd-  +ve B.Sounds, Abd Soft, epigastric and lower abdominal area tenderness without rebound or guarding Extremity/Skin:- No  edema, pedal pulses present  Psych-affect is appropriate, oriented x3, episodes of confusion disorientation and agitation from time to time Neuro-no new focal deficits, no tremors   Data Review:   Micro Results Recent Results (from the past 240 hour(s))  Novel Coronavirus,NAA,(SEND-OUT TO REF LAB - TAT 24-48 hrs); Hosp Order     Status: None   Collection Time: 10/12/18 10:45 PM   Specimen: Nasopharyngeal Swab; Respiratory  Result Value Ref Range Status   SARS-CoV-2, NAA NOT DETECTED NOT DETECTED Final    Comment: (NOTE) This test was developed and its performance characteristics determined by [employer]. This test has not been FDA cleared or approved. This test has been authorized by FDA under an Emergency Use Authorization (EUA). This test is only authorized for the duration of time the declaration that circumstances exist justifying the authorization of the emergency use of in vitro diagnostic tests for detection of SARS-CoV-2  virus and/or diagnosis of COVID-19 infection under section 564(b)(1) of the Act, 21 U.S.C. 595GLO-7(F)(6), unless the authorization is terminated or revoked sooner. When diagnostic testing is negative, the possibility of a false negative result should be considered in the context of a patient's recent exposures and the presence of clinical signs and symptoms consistent with COVID-19. An individual without symptoms of COVID-19 and who is not shedding SARS-CoV-2 virus would expect to have a negative (not detected) result in this assay. Performed  At: [facility] [address] [place], [name] [id number] Rush Farmer MD CZ:[id number]    [place]  Final    Comment: Performed at [facility]  [facility], [address] [place], [place] [address]    Radiology Reports Ct Abdomen Pelvis Wo Contrast  Result Date: 10/12/2018 CLINICAL DATA:  85 year old female with abdominal pain for 3-4 days, rectal bleeding for 2-3 weeks. EXAM: CT ABDOMEN AND PELVIS WITHOUT CONTRAST TECHNIQUE: Multidetector CT imaging of the abdomen and pelvis was performed following the standard protocol without IV contrast. COMPARISON:  CT Abdomen and [facility] 04/07/2018, and earlier. FINDINGS: Lower chest: Chronic cardiomegaly. Cardiac pacemaker leads. Chronic mild lung base scarring. No pericardial or pleural effusion. Hepatobiliary: Surgically absent gallbladder. Stable noncontrast liver with several small benign cysts or hemangiomas. Pancreas: Negative. Spleen: Negative. Adrenals/Urinary Tract: Normal adrenal glands. Punctate right nephrolithiasis is stable. No hydronephrosis. Proximal ureters are decompressed. Mild bilateral perinephric stranding is chronic. Diminutive and unremarkable urinary bladder. Stomach/Bowel: Negative rectum. Mild mesenteric stranding about the sigmoid colon with severe underlying diverticulosis (series 2, image 60). Indistinct appearance of the sigmoid in this  region. No extraluminal gas or fluid. Diverticulosis continues in the descending colon with a 2nd area of questionable mild mesenteric inflammation on series 2, image 29. Redundant transverse colon also with diverticulosis, no active inflammation. No diverticulosis in the right colon. Negative terminal ileum. No dilated small bowel. Small to moderate gastric hiatal hernia is stable. No free air, free fluid. Vascular/Lymphatic: Extensive Aortoiliac calcified atherosclerosis. Vascular patency is not evaluated in the absence of IV contrast. No lymphadenopathy. Reproductive: Surgically absent. Other: No pelvic free fluid. Musculoskeletal: Osteopenia, scoliosis, spine degeneration. No acute osseous abnormality identified. IMPRESSION: 1. Severe diverticulosis of the descending and sigmoid colon. Mild Acute Diverticulitis in the mid sigmoid, and perhaps a 2nd site of active inflammation also in the mid descending colon. No perforation, abscess or complicating features. 2. No other acute or inflammatory process in the abdomen or pelvis. 3.  Aortic Atherosclerosis (ICD10-I70.0). Electronically Signed   By: Genevie Ann M.D.   On: 10/12/2018 22:53     CBC Recent Labs  Lab 10/12/18 1401 10/13/18 0205 10/14/18 0616  WBC 9.9 8.4 6.9  HGB 11.7* 11.4* 10.3*  HCT 37.2 36.3 32.9*  PLT 187 181 167  MCV 96.6 97.6 97.3  MCH 30.4 30.6 30.5  MCHC 31.5 31.4 31.3  RDW 20.7* 20.3* 20.2*  LYMPHSABS  --  1.4  --   MONOABS  --  0.4  --   EOSABS  --  0.2  --   BASOSABS  --  0.0  --     Chemistries  Recent Labs  Lab 10/12/18 1401 10/13/18 0205 10/14/18 0616  NA 139 139 138  K 4.1 3.8 4.2  CL 102 105 107  CO2 25 22 21*  GLUCOSE 87 121* 103*  BUN 26* 24* 18  CREATININE 1.30* 1.38* 1.27*  CALCIUM 9.0 8.7* 8.4*  AST 26  --   --   ALT 15  --   --   ALKPHOS 62  --   --   BILITOT 0.7  --   --    ------------------------------------------------------------------------------------------------------------------ No  results for input(s): CHOL, HDL, LDLCALC, TRIG, CHOLHDL, LDLDIRECT in the last 72 hours.  Lab Results  Component Value Date   HGBA1C 5.8 (H) 12/15/2011   ------------------------------------------------------------------------------------------------------------------ No results for input(s): TSH, T4TOTAL, T3FREE, THYROIDAB in the last 72 hours.  Invalid input(s): FREET3 ------------------------------------------------------------------------------------------------------------------ No results for input(s): VITAMINB12, FOLATE, FERRITIN, TIBC, IRON, RETICCTPCT in the last 72 hours.  Coagulation profile No results for input(s): INR, PROTIME in the last 168 hours.  No results for input(s): DDIMER in the  last 72 hours.  Cardiac Enzymes No results for input(s): CKMB, TROPONINI, MYOGLOBIN in the last 168 hours.  Invalid input(s): CK ------------------------------------------------------------------------------------------------------------------    Component Value Date/Time   BNP 270.0 (H) 04/25/2018 2005     Roxan Hockey M.D on 10/15/2018 at 2:22 PM  Go to [email or web address] - for contact info  [facility] - Office  [phone]

## 2018-10-15 NOTE — Care Management Important Message (Signed)
Important Message  Patient Details  Name: Kimberly [name] MRN: [id number] Date of Birth: 11-04-1926   Medicare Important Message Given:  Yes    Tommy Medal 10/15/2018, 1:53 PM

## 2018-10-15 NOTE — Progress Notes (Signed)
Pt called me into room for "allergic reaction to what I cleaned my hands with".  Pt is having no signs of difficulty breathing, SOB or wheezing.  Hands are normal in color and she doesn't complain of itching.  After further investigation, it appears that patient used peri spray on her hands.  Peri spray removed from room.  Patient attempted to get out of bed and go to the sink.  While we were attempting to move the IV pole and get her walker, patient stated "I can't walk".  Raquel Sarna, CNA stated that she helped pt walk earlier this morning.  Patient then got out of bed without the use of her walker and almost pulled IV out attempting to walk to sink.  I was providing support by holding patient under her L underarm, pt then aggressively moved her arm and told me "let me go".  Patient has been using profanity since I entered the room.  Pt ambulated to the sink and was washing her hands.  Once she was finished she requested a towel.  Raquel Sarna, CNA showed her where the paper towels were located.  Patient refusing to use those.  Wash rag was offered for patient to dry her hands with, but she was still refusing to use them.  Two wash rags were then offered to patient, one for each hand. Patient picked them up and threw them at [place], [place].  Raquel Sarna stated this was not okay and patient began taunting her saying "Oh those towels must have really hurt you."  I told patient that those behaviors were not acceptable and would not be tolerated.  That being verbally abusive and threatening were not appropriate behaviors to healthcare workers that are attempting to provide help and maintain her safety.  Room was then rearranged to patient's preference.  Patient requested heat pack, which was provided.  Patient was pleasant and saying "thank you" when I left the room.  Once out of the room, I heard patient requesting to speak with security.  Security came up and listened to patient's complaints.  I then called to [facility] to speak with patient.   Patient appears to have poor insight into her socially inappropriate behaviors, medical conditions, and safety.

## 2018-10-16 LAB — CBC
HCT: 31.9 % — ABNORMAL LOW (ref 36.0–46.0)
Hemoglobin: 10.3 g/dL — ABNORMAL LOW (ref 12.0–15.0)
MCH: 31.7 pg (ref 26.0–34.0)
MCHC: 32.3 g/dL (ref 30.0–36.0)
MCV: 98.2 fL (ref 80.0–100.0)
Platelets: 160 10*3/uL (ref 150–400)
RBC: 3.25 MIL/uL — ABNORMAL LOW (ref 3.87–5.11)
RDW: 20.4 % — ABNORMAL HIGH (ref 11.5–15.5)
WBC: 5.8 10*3/uL (ref 4.0–10.5)
nRBC: 0 % (ref 0.0–0.2)

## 2018-10-16 LAB — BASIC METABOLIC PANEL
Anion gap: 7 (ref 5–15)
BUN: 9 mg/dL (ref 8–23)
CO2: 23 mmol/L (ref 22–32)
Calcium: 8.6 mg/dL — ABNORMAL LOW (ref 8.9–10.3)
Chloride: 110 mmol/L (ref 98–111)
Creatinine, Ser: 1.15 mg/dL — ABNORMAL HIGH (ref 0.44–1.00)
GFR calc Af Amer: 49 mL/min — ABNORMAL LOW (ref >60)
GFR calc non Af Amer: 42 mL/min — ABNORMAL LOW (ref >60)
Glucose, Bld: 90 mg/dL (ref 70–99)
Potassium: 4.3 mmol/L (ref 3.5–5.1)
Sodium: 140 mmol/L (ref 135–145)

## 2018-10-16 LAB — GLUCOSE, CAPILLARY: Glucose-Capillary: 76 mg/dL (ref 70–99)

## 2018-10-16 MED ORDER — CIPROFLOXACIN IN D5W 400 MG/200ML IV SOLN
400.0000 mg | Freq: Two times a day (BID) | INTRAVENOUS | Status: DC
  Administered 2018-10-16: 400 mg via INTRAVENOUS
  Filled 2018-10-16: qty 200

## 2018-10-16 MED ORDER — QUETIAPINE FUMARATE 25 MG PO TABS
25.0000 mg | ORAL_TABLET | Freq: Every day | ORAL | Status: DC
  Administered 2018-10-17 – 2018-10-18 (×2): 25 mg via ORAL
  Filled 2018-10-16 (×3): qty 1

## 2018-10-16 MED ORDER — HYDROCODONE-ACETAMINOPHEN 7.5-325 MG PO TABS
1.0000 | ORAL_TABLET | Freq: Four times a day (QID) | ORAL | Status: DC | PRN
  Administered 2018-10-16 – 2018-10-18 (×2): 1 via ORAL
  Filled 2018-10-16 (×2): qty 1

## 2018-10-16 MED ORDER — LORAZEPAM 2 MG/ML IJ SOLN
0.5000 mg | Freq: Two times a day (BID) | INTRAMUSCULAR | Status: DC | PRN
  Administered 2018-10-16 – 2018-10-17 (×2): 0.5 mg via INTRAVENOUS
  Filled 2018-10-16 (×2): qty 1

## 2018-10-16 MED ORDER — TRAZODONE HCL 50 MG PO TABS
100.0000 mg | ORAL_TABLET | Freq: Every day | ORAL | Status: DC
  Administered 2018-10-16 – 2018-10-17 (×2): 100 mg via ORAL
  Filled 2018-10-16 (×2): qty 2

## 2018-10-16 MED ORDER — SODIUM CHLORIDE 0.9 % IV SOLN
1.0000 g | INTRAVENOUS | Status: DC
  Administered 2018-10-16 – 2018-10-18 (×3): 1 g via INTRAVENOUS
  Filled 2018-10-16 (×3): qty 10

## 2018-10-16 NOTE — Progress Notes (Addendum)
Patient Demographics:    Kimberly [name], is a 85 y.o. female, DOB - 03/12/1927, ZTI:[id number]  Admit date - 10/12/2018   Admitting Physician Vianne Bulls, MD  Outpatient Primary MD for the patient is [name], DO  LOS - 3  Chief Complaint  Patient presents with   Rectal Bleeding        Subjective:    [name] today has no fevers, no emesis,  no hematochezia,... No further bowel movement in several hours, tolerating liquid diet, requesting a diet be advanced, had some confusion/disorientation overnight  Assessment  & Plan :    Principal Problem:   Acute diverticulitis Active Problems:   Permanent atrial fibrillation   Essential hypertension   CKD (chronic kidney disease) stage 3, GFR 30-59 ml/min (HCC)   Chronic diastolic heart failure (HCC)   History of bipolar disorder   Lower GI bleed  Brief Summary 85 year old female with past medical history relevant for bipolar disorder, atrial fibrillation on chronic anticoagulation with Eliquis, diastolic CHF, CKD 3, hypertension and anxiety disorder admitted with abdominal pain and rectal bleeding on 10/12/2018 and found to have diverticulitis  A/p 1)Acute Diverticulitis with Bleeding--- abdominal pain is not worse, no further hematochezia  CT findings noted, no abscess or perforation, apparently failed outpatient doxycycline, continue Flagyl , stop Cipro, start iv Rocephin as below in # 6, advance to full liquid diet  2)Chronic Atrial Fibrillation--very high stroke risk with [place] VaSc score  of 5, Eliquis on hold due to GI bleed as above #1... Continue Coreg for rate control,  3)HFpEF--- no acute exacerbation of diastolic dysfunction CHF at this time, Lasix is on hold until oral intake improves, be judicious with fluids, continue Coreg  4)Bipolar Disorder with Confusional episodes--- episodes of confusion and disorientation seizures in the  evenings, query sundowning.... Change Seroquel to 25 mg nightly and change trazodone to 100 mg nightly , use lorazepam as needed agitation  5)CKD III--- creatinine is down to 1.1 from a baseline of 1.5 , much improved renal function due to hydration, nephrotoxic agents monitor renal function closely  6)Lt LE Cellulitis----does not seem to have improved significantly on IV Cipro, Stop Cipro, patient with questionable history of penicillin allergy, will start IV Rocephin on 10/16/18, as per pharmacist(Mr Coffee), patient did get Keflex from [place] in January 2020 so doubt significant cephalosporin allergy.  Disposition/Need for in-Hospital Stay- patient unable to be discharged at this time due to requiring IV antibiotics for acute diverticulitis with bleeding, may need transfusion of packed red blood cells*  Code Status : Full  Family Communication:  D/w her neighbor/Friend Ms Kaplan as I was unable to reach her daughter or her son, left messages for daughter  Disposition Plan  : TBD  Consults  :  na  DVT Prophylaxis  :  - SCDs/TEDs  (GI bleed)  Lab Results  Component Value Date   PLT 160 10/16/2018   Inpatient Medications  Scheduled Meds:  carvedilol  6.25 mg Oral BID WC   pantoprazole  40 mg Oral Daily   [START ON 10/17/2018] QUEtiapine  25 mg Oral Q breakfast   sodium chloride flush  3 mL Intravenous Q12H   traZODone  100 mg Oral QHS   Continuous  Infusions:  sodium chloride     sodium chloride 30 mL/hr at 10/15/18 1751   ciprofloxacin     metronidazole 500 mg (10/16/18 0625)   PRN Meds:.sodium chloride, acetaminophen **OR** acetaminophen, LORazepam, morphine injection, ondansetron **OR** ondansetron (ZOFRAN) IV, sodium chloride flush    Anti-infectives (From admission, onward)   Start     Dose/Rate Route Frequency Ordered Stop   10/16/18 1300  ciprofloxacin (CIPRO) IVPB 400 mg     400 mg 200 mL/hr over 60 Minutes Intravenous Every 12 hours 10/16/18  0932     10/14/18 0100  ciprofloxacin (CIPRO) IVPB 400 mg  Status:  Discontinued     400 mg 200 mL/hr over 60 Minutes Intravenous Every 24 hours 10/13/18 0805 10/16/18 0932   10/13/18 1830  ciprofloxacin (CIPRO) IVPB 400 mg  Status:  Discontinued     400 mg 200 mL/hr over 60 Minutes Intravenous Every 18 hours 10/13/18 0035 10/13/18 0803   10/13/18 0730  metroNIDAZOLE (FLAGYL) IVPB 500 mg     500 mg 100 mL/hr over 60 Minutes Intravenous Every 8 hours 10/13/18 0048     10/13/18 0015  ciprofloxacin (CIPRO) IVPB 400 mg     400 mg 200 mL/hr over 60 Minutes Intravenous  Once 10/13/18 0003 10/13/18 0259   10/12/18 2330  levofloxacin (LEVAQUIN) IVPB 750 mg  Status:  Discontinued     750 mg 100 mL/hr over 90 Minutes Intravenous Every 24 hours 10/12/18 2325 10/12/18 2349   10/12/18 2330  metroNIDAZOLE (FLAGYL) IVPB 500 mg     500 mg 100 mL/hr over 60 Minutes Intravenous  Once 10/12/18 2325 10/13/18 0133       Objective:   Vitals:   10/15/18 2123 10/15/18 2123 10/16/18 0547 10/16/18 0548  BP: (!) 157/85 (!) 157/85 116/62 116/62  Pulse: 71 72 70 70  Resp:  18  18  Temp: 97.6 F (36.4 C) 97.6 F (36.4 C)    TempSrc: Oral Oral    SpO2: 100% 100% 96% 96%  Weight:      Height:        Wt Readings from Last 3 Encounters:  10/15/18 67.3 kg  06/02/18 68.7 kg  05/06/18 66.7 kg    Intake/Output Summary (Last 24 hours) at 10/16/2018 1121 Last data filed at 10/16/2018 0900 Gross per 24 hour  Intake 1881 ml  Output --  Net 1881 ml   Physical Exam  Gen:- Awake Alert, in no apparent distress  HEENT:- [place].AT, No sclera icterus Neck-Supple Neck,No JVD,.  Lungs-  CTAB , fair symmetrical air movement CV- S1, S2 normal, irregularly irregular Abd-  +ve B.Sounds, Abd Soft, epigastric and lower abdominal area tenderness without rebound or guarding Extremity- pedal pulses present  Psych-affect is appropriate, oriented x3, episodes of confusion disorientation and agitation from time to  time Neuro-generalized weakness, but no no new focal deficits, no tremors Skin--- left lower extremity with some swelling erythema and warmth, over the fibular side of her left leg there is some abrasion with secondary inflammatory/infection type changes   Data Review:   Micro Results Recent Results (from the past 240 hour(s))  Novel Coronavirus,NAA,(SEND-OUT TO REF LAB - TAT 24-48 hrs); Hosp Order     Status: None   Collection Time: 10/12/18 10:45 PM   Specimen: Nasopharyngeal Swab; Respiratory  Result Value Ref Range Status   SARS-CoV-2, NAA NOT DETECTED NOT DETECTED Final    Comment: (NOTE) This test was developed and its performance characteristics determined by [employer]. This test has not  been FDA cleared or approved. This test has been authorized by FDA under an Emergency Use Authorization (EUA). This test is only authorized for the duration of time the declaration that circumstances exist justifying the authorization of the emergency use of in vitro diagnostic tests for detection of SARS-CoV-2 virus and/or diagnosis of COVID-19 infection under section 564(b)(1) of the Act, 21 U.S.C. 563JSH-7(W)(2), unless the authorization is terminated or revoked sooner. When diagnostic testing is negative, the possibility of a false negative result should be considered in the context of a patient's recent exposures and the presence of clinical signs and symptoms consistent with COVID-19. An individual without symptoms of COVID-19 and who is not shedding SARS-CoV-2 virus would expect to have a negative (not detected) result in this assay. Performed  At: [facility] [place], [name] [id number] Rush Farmer MD YD:[id number]    [place]  Final    Comment: Performed at [facility], [address] [place], [place] [address]   Radiology Reports Ct Abdomen Pelvis Wo Contrast  Result Date: 10/12/2018 CLINICAL DATA:  85 year old  female with abdominal pain for 3-4 days, rectal bleeding for 2-3 weeks. EXAM: CT ABDOMEN AND PELVIS WITHOUT CONTRAST TECHNIQUE: Multidetector CT imaging of the abdomen and pelvis was performed following the standard protocol without IV contrast. COMPARISON:  CT Abdomen and [facility] 04/07/2018, and earlier. FINDINGS: Lower chest: Chronic cardiomegaly. Cardiac pacemaker leads. Chronic mild lung base scarring. No pericardial or pleural effusion. Hepatobiliary: Surgically absent gallbladder. Stable noncontrast liver with several small benign cysts or hemangiomas. Pancreas: Negative. Spleen: Negative. Adrenals/Urinary Tract: Normal adrenal glands. Punctate right nephrolithiasis is stable. No hydronephrosis. Proximal ureters are decompressed. Mild bilateral perinephric stranding is chronic. Diminutive and unremarkable urinary bladder. Stomach/Bowel: Negative rectum. Mild mesenteric stranding about the sigmoid colon with severe underlying diverticulosis (series 2, image 60). Indistinct appearance of the sigmoid in this region. No extraluminal gas or fluid. Diverticulosis continues in the descending colon with a 2nd area of questionable mild mesenteric inflammation on series 2, image 29. Redundant transverse colon also with diverticulosis, no active inflammation. No diverticulosis in the right colon. Negative terminal ileum. No dilated small bowel. Small to moderate gastric hiatal hernia is stable. No free air, free fluid. Vascular/Lymphatic: Extensive Aortoiliac calcified atherosclerosis. Vascular patency is not evaluated in the absence of IV contrast. No lymphadenopathy. Reproductive: Surgically absent. Other: No pelvic free fluid. Musculoskeletal: Osteopenia, scoliosis, spine degeneration. No acute osseous abnormality identified. IMPRESSION: 1. Severe diverticulosis of the descending and sigmoid colon. Mild Acute Diverticulitis in the mid sigmoid, and perhaps a 2nd site of active inflammation also  in the mid descending colon. No perforation, abscess or complicating features. 2. No other acute or inflammatory process in the abdomen or pelvis. 3.  Aortic Atherosclerosis (ICD10-I70.0). Electronically Signed   By: Genevie Ann M.D.   On: 10/12/2018 22:53    CBC Recent Labs  Lab 10/12/18 1401 10/13/18 0205 10/14/18 0616 10/16/18 0619  WBC 9.9 8.4 6.9 5.8  HGB 11.7* 11.4* 10.3* 10.3*  HCT 37.2 36.3 32.9* 31.9*  PLT 187 181 167 160  MCV 96.6 97.6 97.3 98.2  MCH 30.4 30.6 30.5 31.7  MCHC 31.5 31.4 31.3 32.3  RDW 20.7* 20.3* 20.2* 20.4*  LYMPHSABS  --  1.4  --   --   MONOABS  --  0.4  --   --   EOSABS  --  0.2  --   --   BASOSABS  --  0.0  --   --  Chemistries  Recent Labs  Lab 10/12/18 1401 10/13/18 0205 10/14/18 0616 10/16/18 0619  NA 139 139 138 140  K 4.1 3.8 4.2 4.3  CL 102 105 107 110  CO2 25 22 21* 23  GLUCOSE 87 121* 103* 90  BUN 26* 24* 18 9  CREATININE 1.30* 1.38* 1.27* 1.15*  CALCIUM 9.0 8.7* 8.4* 8.6*  AST 26  --   --   --   ALT 15  --   --   --   ALKPHOS 62  --   --   --   BILITOT 0.7  --   --   --    ------------------------------------------------------------------------------------------------------------------ No results for input(s): CHOL, HDL, LDLCALC, TRIG, CHOLHDL, LDLDIRECT in the last 72 hours.  Lab Results  Component Value Date   HGBA1C 5.8 (H) 12/15/2011   ------------------------------------------------------------------------------------------------------------------ No results for input(s): TSH, T4TOTAL, T3FREE, THYROIDAB in the last 72 hours.  Invalid input(s): FREET3 ------------------------------------------------------------------------------------------------------------------ No results for input(s): VITAMINB12, FOLATE, FERRITIN, TIBC, IRON, RETICCTPCT in the last 72 hours.  Coagulation profile No results for input(s): INR, PROTIME in the last 168 hours.  No results for input(s): DDIMER in the last 72 hours.  Cardiac  Enzymes No results for input(s): CKMB, TROPONINI, MYOGLOBIN in the last 168 hours.  Invalid input(s): CK ------------------------------------------------------------------------------------------------------------------    Component Value Date/Time   BNP 270.0 (H) 04/25/2018 2005   Roxan Hockey M.D on 10/16/2018 at 11:21 AM  Go to [email or web address] - for contact info  [facility] - Office  [phone]

## 2018-10-16 NOTE — Progress Notes (Signed)
Pharmacy Antibiotic Note  Kimberly [name] is a 85 y.o. female admitted on 10/12/2018 with intar-abdominal infection.  Pharmacy has been consulted for  Cipro dosing.  Plan: Ciprofloxacin 400mg  iv q12h, change from 24h due to renal fx  Monitor labs, cultures, v/s and patient progress.  Height: 5\' 6"  (167.6 cm) Weight: 148 lb 5.9 oz (67.3 kg) IBW/kg (Calculated) : 59.3  Temp (24hrs), Avg:97.6 F (36.4 C), Min:97.6 F (36.4 C), Max:97.6 F (36.4 C)  Recent Labs  Lab 10/12/18 1401 10/13/18 0205 10/14/18 0616 10/16/18 0619  WBC 9.9 8.4 6.9 5.8  CREATININE 1.30* 1.38* 1.27* 1.15*    Estimated Creatinine Clearance: 31 mL/min (A) (by C-G formula based on SCr of 1.15 mg/dL (H)).    Allergies  Allergen Reactions  . Ciprofloxacin Other (See Comments)    Possibly caused diarrhea November 2018  . Flagyl [Metronidazole] Other (See Comments)    Possibly caused diarrhea November 2018  . Iron Swelling    Ferrous Sulfate - tongue swelling   . Papaya Derivatives Hives  . Iodine Rash and Other (See Comments)    REACTION:If injected,  Rash/irritated skin reaction "welts"  . Penicillins Hives    DID THE REACTION INVOLVE: Swelling of the face/tongue/throat, SOB, or low BP? Unknown Sudden or severe rash/hives, skin peeling, or the inside of the mouth or nose? Yes Did it require medical treatment? Yes When did it last happen?39 or 85 years old If all above answers are "NO", may proceed with cephalosporin use.  . Sulfa Antibiotics Rash    Antimicrobials this admission: Cipro  6/10>>  metronidazole 6/10 >>  Levaquin 6/09>> 6/9   Dose adjustments this admission: Cipro   Microbiology results: no cultures  Ordered at this time  Thank you for allowing pharmacy to be a part of this patient's care.  Donna Christen Lennard Capek 10/16/2018 9:30 AM

## 2018-10-16 NOTE — Progress Notes (Signed)
Patient sitting on the side of the bed eating lunch. Patient refuses to have bed alarm turned on by the nursing staff. Patient has been taught the risks and benefits of the bed alarm but still refuses to allow staff to turn the alarm on.

## 2018-10-16 NOTE — Progress Notes (Signed)
Went in to give night time medications, and patient did not remember speaking to security or [place], [facility].  Patient is pleasant, cooperative, and saying things like please and thank you when I was providing care.

## 2018-10-16 NOTE — Progress Notes (Signed)
Patient refusing bed alarm at this time.  Patient states she does not need help and that she lives alone.  Patient educated on reasons for bed alarms.  Patient still refusing.

## 2018-10-17 LAB — CBC
HCT: 32.8 % — ABNORMAL LOW (ref 36.0–46.0)
Hemoglobin: 10.5 g/dL — ABNORMAL LOW (ref 12.0–15.0)
MCH: 31.7 pg (ref 26.0–34.0)
MCHC: 32 g/dL (ref 30.0–36.0)
MCV: 99.1 fL (ref 80.0–100.0)
Platelets: 159 10*3/uL (ref 150–400)
RBC: 3.31 MIL/uL — ABNORMAL LOW (ref 3.87–5.11)
RDW: 20.9 % — ABNORMAL HIGH (ref 11.5–15.5)
WBC: 6.4 10*3/uL (ref 4.0–10.5)
nRBC: 0 % (ref 0.0–0.2)

## 2018-10-17 LAB — BASIC METABOLIC PANEL
Anion gap: 11 (ref 5–15)
BUN: 9 mg/dL (ref 8–23)
CO2: 18 mmol/L — ABNORMAL LOW (ref 22–32)
Calcium: 8.5 mg/dL — ABNORMAL LOW (ref 8.9–10.3)
Chloride: 106 mmol/L (ref 98–111)
Creatinine, Ser: 1.09 mg/dL — ABNORMAL HIGH (ref 0.44–1.00)
GFR calc Af Amer: 52 mL/min — ABNORMAL LOW (ref >60)
GFR calc non Af Amer: 45 mL/min — ABNORMAL LOW (ref >60)
Glucose, Bld: 100 mg/dL — ABNORMAL HIGH (ref 70–99)
Potassium: 4.5 mmol/L (ref 3.5–5.1)
Sodium: 135 mmol/L (ref 135–145)

## 2018-10-17 LAB — GLUCOSE, CAPILLARY: Glucose-Capillary: 92 mg/dL (ref 70–99)

## 2018-10-17 MED ORDER — OXYCODONE-ACETAMINOPHEN 7.5-325 MG PO TABS
1.0000 | ORAL_TABLET | Freq: Once | ORAL | Status: AC
  Administered 2018-10-17: 17:00:00 1 via ORAL
  Filled 2018-10-17: qty 1

## 2018-10-17 MED ORDER — CARVEDILOL 6.25 MG PO TABS: 6.2500 mg | ORAL_TABLET | Freq: Two times a day (BID) | ORAL | 2 refills | Status: DC

## 2018-10-17 MED ORDER — TRAZODONE HCL 150 MG PO TABS: 150.0000 mg | ORAL_TABLET | Freq: Every day | ORAL | 3 refills | Status: DC

## 2018-10-17 MED ORDER — CEFDINIR 300 MG PO CAPS: 300.0000 mg | ORAL_CAPSULE | Freq: Two times a day (BID) | ORAL | 0 refills | Status: DC

## 2018-10-17 MED ORDER — LORAZEPAM 2 MG/ML IJ SOLN
0.5000 mg | Freq: Once | INTRAMUSCULAR | Status: AC
  Administered 2018-10-17: 01:00:00 0.5 mg via INTRAVENOUS
  Filled 2018-10-17: qty 1

## 2018-10-17 MED ORDER — QUETIAPINE FUMARATE 25 MG PO TABS: 25.0000 mg | ORAL_TABLET | Freq: Two times a day (BID) | ORAL | 3 refills | Status: DC

## 2018-10-17 MED ORDER — FUROSEMIDE 20 MG PO TABS: 20.0000 mg | ORAL_TABLET | Freq: Every day | ORAL | 3 refills | Status: DC

## 2018-10-17 MED ORDER — FUROSEMIDE 20 MG PO TABS
20.0000 mg | ORAL_TABLET | Freq: Every day | ORAL | Status: DC
  Administered 2018-10-17 – 2018-10-18 (×2): 20 mg via ORAL
  Filled 2018-10-17 (×2): qty 1

## 2018-10-17 MED ORDER — ONDANSETRON HCL 4 MG PO TABS: 4.0000 mg | ORAL_TABLET | Freq: Four times a day (QID) | ORAL | 0 refills | Status: DC | PRN

## 2018-10-17 MED ORDER — METRONIDAZOLE 500 MG PO TABS: 500.0000 mg | ORAL_TABLET | Freq: Three times a day (TID) | ORAL | 0 refills | Status: DC

## 2018-10-17 MED ORDER — POTASSIUM CHLORIDE CRYS ER 10 MEQ PO TBCR: 10.0000 meq | EXTENDED_RELEASE_TABLET | Freq: Every day | ORAL | 1 refills | Status: DC

## 2018-10-17 MED ORDER — ACETAMINOPHEN 325 MG PO TABS: 650.0000 mg | ORAL_TABLET | Freq: Four times a day (QID) | ORAL | 0 refills | Status: DC | PRN

## 2018-10-17 MED ORDER — APIXABAN 2.5 MG PO TABS: 2.5000 mg | ORAL_TABLET | Freq: Two times a day (BID) | ORAL | 1 refills | Status: DC

## 2018-10-17 MED ORDER — OXYCODONE-ACETAMINOPHEN 7.5-325 MG PO TABS: 1.0000 | ORAL_TABLET | Freq: Four times a day (QID) | ORAL | 0 refills | Status: DC | PRN

## 2018-10-17 MED ORDER — OMEPRAZOLE MAGNESIUM 20 MG PO TBEC: 20.0000 mg | DELAYED_RELEASE_TABLET | Freq: Every day | ORAL | 0 refills | Status: DC

## 2018-10-17 NOTE — Plan of Care (Signed)
  Problem: Acute Rehab PT Goals(only PT should resolve) Goal: Pt Will Go Supine/Side To Sit Outcome: Progressing Flowsheets (Taken 10/17/2018 1027) Pt will go Supine/Side to Sit: Independently Goal: Patient Will Transfer Sit To/From Stand Outcome: Progressing Flowsheets (Taken 10/17/2018 1027) Patient will transfer sit to/from stand: with modified independence Goal: Pt Will Transfer Bed To Chair/Chair To Bed Outcome: Progressing Flowsheets (Taken 10/17/2018 1027) Pt will Transfer Bed to Chair/Chair to Bed: with modified independence Goal: Pt Will Ambulate Outcome: Progressing Flowsheets (Taken 10/17/2018 1027) Pt will Ambulate:  > 125 feet  with least restrictive assistive device  with modified independence Goal: Pt Will Go Up/Down Stairs Outcome: Progressing Flowsheets (Taken 10/17/2018 1027) Pt will Go Up / Down Stairs:  1-2 stairs  with modified independence   Clarene Critchley PT, DPT 10:28 AM, 10/17/18 [phone]

## 2018-10-17 NOTE — Discharge Summary (Addendum)
Kimberly [name], is a 85 y.o. female  DOB Aug 14, 1926  MRN [id number].  Admission date:  10/12/2018  Admitting Physician  Vianne Bulls, MD  Discharge Date:  10/18/2018   Primary MD  [name], DO  Recommendations for primary care physician for things to follow:   Acute diverticulitis/cellulitis with generalized weakness and debility  1) please take Omnicef/cefdinir and Flagyl/metronidazole-your antibiotics for bowel infection and left leg infection--- if left leg infection/redness fails to resolve you may need to be switched to oral doxycycline to cover for possible MRSA infection 2) please follow-up with your primary care physician on Wednesday, 10/20/2018 for recheck and reevaluation 3) please hold Eliquis/apixaban blood thinner until Wednesday, 10/20/2018 at which time you may restart it if no further bleeding from your bowel 4) you need to follow-up with your primary care physician on 10/20/2018 as advised for recheck, reevaluation as well as for repeat CBC and BMP blood test 5) you are taking apixaban/Eliquis which is a blood thinner so Avoid ibuprofen/Advil/Aleve/Motrin/Goody Powders/Naproxen/BC powders/Meloxicam/Diclofenac/Indomethacin and other Nonsteroidal anti-inflammatory medications as these will make you more likely to bleed and can cause stomach ulcers, can also cause Kidney problems.  6) please take all your medications exactly as prescribed--please call if any concerns about your medications 7) you have refused to go to skilled nursing facility for physical therapy rehab--- a therapist to come to the house couple times a week to help you get stronger    Admission Diagnosis  Acute GI bleeding [K92.2] Lower abdominal pain [R10.30] Acute diverticulitis [K57.92]   Discharge Diagnosis  Acute GI bleeding [K92.2] Lower abdominal pain [R10.30] Acute diverticulitis [K57.92]   Principal  Problem:   Acute diverticulitis Active Problems:   History of bipolar disorder   Permanent atrial fibrillation   Essential hypertension   CKD (chronic kidney disease) stage 3, GFR 30-59 ml/min (HCC)   Chronic diastolic heart failure ([place])   Lower GI bleed      Past Medical History:  Diagnosis Date   Acute blood loss anemia 10/11/2012   Acute diverticulitis 08/24/2013   Acute on chronic combined systolic and diastolic CHF, NYHA class 4 ([place]) 11/15/2013   Antral ulcer 10/11/2012   Arrhythmia    atrial fibb   Atrial fibrillation (HCC)    Cardiomyopathy, nonischemic (HCC)    Chronic anticoagulation 10/12/2012   CKD (chronic kidney disease) stage 3, GFR 30-59 ml/min (HCC) 10/12/2012   Depression    Erosive esophagitis 10/11/2012   Fibromyalgia    Glaucoma    H/O echocardiogram 2007   EF 40-45%,          Hypertension    Osteoarthritis    Pacemaker    Last saw cards 07/2013   Scoliosis     Past Surgical History:  Procedure Laterality Date   ABDOMINAL HYSTERECTOMY     APPENDECTOMY     BACK SURGERY     BIOPSY  03/03/2017   Procedure: BIOPSY;  Surgeon: Danie Binder, MD;  [facility];  Service: Endoscopy;;  gastric   BREAST SURGERY     CARDIAC CATHETERIZATION  12/08/2005   LAD AND LEFT MAIN WITH NO HIGH-GRADE STENOSIS. MILD DISEASE IN THE CX AND LAD SYSTEM. SEVERE LV DYSFUNCTION WITH DILATION OF THE LV. EF 15-20%. LV END-DIASTOLIC PRESSURE IS 90. +1 MR.   CHOLECYSTECTOMY     COLONOSCOPY N/A 03/03/2017   Procedure: COLONOSCOPY;  Surgeon: Danie Binder, MD;  [facility];  Service: Endoscopy;  Laterality: N/A;   CYSTOSCOPY N/A 02/24/2013   Procedure: CYSTOSCOPY WITH URETHRAL DILITATION;  Surgeon: Marissa Nestle, MD;  Location: [facility];  Service: Urology;  Laterality: N/A;   DOPPLER ECHOCARDIOGRAPHY N/A 05/30/2010   LV SIZE IS NORMAL. LV SYSTOLIC FUNCTION IS LOW NORMAL. EF=50-55%. MILD INFERIOR HYPOKINESIS.MILD TO MODERATE POSTERIOR  WALL HYPOKINESIS.PACEMAKER LEAD IN THE RV. LA IS MILDLY DILATED. RA IS MODERATE TO SEVERLY DILATED. PACEMAKER LEAD IN THE RA. MILD CALCICICATION OF THE MV APPARATUS. MODERATE MR. MILD TO MODERATE TR. MILD PHTN.AV MILDLY SCLEROTIC.   ESOPHAGOGASTRODUODENOSCOPY N/A 10/13/2012   Dr. Gala Romney: severe ulcerative reflux esophagitis, question of Barrett's but negative path, single deep prepyloric antral ulcer, negative H.pylori   ESOPHAGOGASTRODUODENOSCOPY N/A 03/03/2017   Procedure: ESOPHAGOGASTRODUODENOSCOPY (EGD);  Surgeon: Danie Binder, MD;  [facility];  Service: Endoscopy;  Laterality: N/A;   HERNIA REPAIR     right inguinal hernia and umbilical   LOWETR EXT VENOUS Bilateral 11-08-10   R & L- NO EVIDENCE OF THROMBUS OR THROMBOPHLEBITIS. THERE IS MILD AMOUNT OF SUBCUTANEOUS EDEMA NOTED WITHIN THE LEFT CALF AND ANKLE. R & L GSV AND SSV- NO VENOUS INSUFF NOTED.   NECK SURGERY     NUCLEAR STRESS TEST N/A 02/13/2009   NORMAL PATTERN OF PERFUSION IN ALL REGIONS. POST STRESS VENTICULAR SIZE IS NORMAL. POST  STESS EF 85%.  NORMAL MYOCARDIAL PERFUSION STUDY.   PACEMAKER INSERTION     POLYPECTOMY  03/03/2017   Procedure: POLYPECTOMY;  Surgeon: Danie Binder, MD;  [facility];  Service: Endoscopy;;  colon   TONSILLECTOMY     YAG LASER APPLICATION Bilateral 12/27/537   Procedure: YAG LASER APPLICATION;  Surgeon: Williams Che, MD;  Location: [facility];  Service: Ophthalmology;  Laterality: Bilateral;     HPI  from the history and physical done on the day of admission:    PCP: [name], DO   Patient coming from: Home   Chief Complaint: Abdominal pain, rectal bleeding   HPI: Kimberly [name] is a 85 y.o. female with medical history significant for bipolar disorder, atrial fibrillation on Eliquis, chronic kidney disease stage III, anxiety, chronic diastolic CHF, and hypertension, now presenting to the emergency department for evaluation of lower abdominal pain  and rectal bleeding.  Patient reports that she was recently evaluated at an outside hospital for these complaints, was diagnosed with acute diverticulitis, and due to her multiple listed drug allergies, she was started on doxycycline.  The patient reports continued, and worsening pain in the lower abdomen and increased rectal bleeding despite the antibiotic.  She denies any fevers or chills, denies nausea and vomiting, and denied lightheadedness or chest pain.  ED Course: Upon arrival to the ED, patient is found to be afebrile, saturating well on room air, and with remaining vitals also normal.  Chemistry panel is notable for creatinine 1.30, consistent with her apparent baseline.  CBC features a hemoglobin of 11.7, increased from prior.  Fecal occult blood testing is positive.  COVID-19 screening test remains pending.  Patient was given 4 mg IV morphine and empiric antibiotics in the emergency department and hospitalists are asked to admit.   Hospital Course:   Brief Summary 85 year old female with past medical history relevant for bipolar disorder, atrial fibrillation on chronic anticoagulation with Eliquis, diastolic CHF, CKD 3, hypertension and anxiety disorder admitted with abdominal pain and rectal bleeding on 10/12/2018 and found to have diverticulitis and left lower extremity cellulitis  A/p 1)Acute Diverticulitis with Bleeding--- no further bleeding, abdominal pain and tenderness has improved ,, no further hematochezia  CT findings noted, no abscess or perforation, apparently failed outpatient doxycycline, overall improved with antibiotics, initially treated with Cipro and Flagyl, switched to Rocephin and  Flagyl , okay to discharge on Omnicef and Flagyl.  No fevers or leukocytosis.  Hemoglobin is stable at 10.5  2)Chronic Atrial Fibrillation--very high stroke risk with [place] VaSc score  of 5, Eliquis on hold due to GI bleed as above #1... Continue Coreg for rate control, may restart Eliquis on  10/20/2018 if no further bleeding.  Hemoglobin is stable at 10.5.  platelets 159  3)HFpEF--- no acute exacerbation of diastolic dysfunction CHF at this time, okay to resume home dose Lasix  at 20 mg daily improves,   continue Coreg  4)Bipolar Disorder with Confusional Episodes--- episodes of confusion and disorientation seizures in the evenings, query sundowning.... Change Seroquel to 25 mg twice daily and trazodone to 150 mg nightly.  Discussed with patient's daughter Ms. [name].... Patient has struggled for many many years with mood/bipolar disorders with episodes of mania from time to time... Current behavior is not unusual for her according to patient's daughter  5)CKD III---  much improved, creatinine is down to 1.0  from a baseline of 1.5 , much improved renal function due to hydration,  avoid nephrotoxic agents   6)Lt LE Cellulitis----improving, okay to discharge on p.o. Omnicef, recheck with PCP on 10/20/2018 if cellulitis fails to resolve as anticipated may switch to p.o. doxycycline to have some MRSA coverage.  patient with questionable history of penicillin allergy,   as per pharmacist(Mr Coffee), patient did get Keflex from [facility] in January 2020 so doubt significant cephalosporin allergy.  No fevers or leukocytosis  7) generalized weakness and debility-----PTA patient lives alone, gets around with a walker, physical therapist recommended SNF rehab, patient refuses to go to SNF for rehab she wants to go home with home health services, discussed with patient's daughter [name], patient's daughter concurs.... Per patient and her daughter request we will discharge home with home health services including home med PT, RN and social worker  Code Status : Full  Family Communication:   At patient's request D/w her neighbor/Friend Ms [place] Gladstone Pih, also d/w  her daughter Ms [name] --[phone],    left messages for her son  Disposition Plan  : Home with home health,  hemodynamically stable, H&H is stable  Consults  :  na  DVT Prophylaxis  :  - SCDs/TEDs  (GI bleed)  Discharge Condition: stable  Follow UP  Follow-up Information    Ronnie Doss M, DO Follow up in 5 day(s).   Specialty: Family Medicine Why: Wednesday, 10/20/2018 @ 4:00 p.m. In office appontment. Contact information: [place] [address] [phone]        Sanda Klein, MD .   Specialty: Cardiology Contact information: [address] [place] [address] [phone]           Diet and Activity recommendation:  As  advised  Discharge Instructions    Discharge Instructions    (HEART FAILURE PATIENTS) Call MD:  Anytime you have any of the following symptoms: 1) 3 pound weight gain in 24 hours or 5 pounds in 1 week 2) shortness of breath, with or without a dry hacking cough 3) swelling in the hands, feet or stomach 4) if you have to sleep on extra pillows at night in order to breathe.   Complete by: As directed    Call MD for:  difficulty breathing, headache or visual disturbances   Complete by: As directed    Call MD for:  persistant dizziness or light-headedness   Complete by: As directed    Call MD for:  persistant nausea and vomiting   Complete by: As directed    Call MD for:  redness, tenderness, or signs of infection (pain, swelling, redness, odor or green/yellow discharge around incision site)   Complete by: As directed    Call MD for:  severe uncontrolled pain   Complete by: As directed    Call MD for:  temperature >100.4   Complete by: As directed    Diet - low sodium heart healthy   Complete by: As directed    Discharge instructions   Complete by: As directed    Acute diverticulitis/cellulitis with generalized weakness and debility   1) please take Omnicef/cefdinir and Flagyl/metronidazole-your antibiotics for bowel infection and left leg infection--- if left leg infection/redness fails to resolve you may need to  be switched to oral doxycycline to cover for possible MRSA infection 2) please follow-up with your primary care physician on Wednesday, 10/20/2018 for recheck and reevaluation 3) please hold Eliquis/apixaban blood thinner until Wednesday, 10/20/2018 at which time you may restart it if no further bleeding from your bowel 4) you need to follow-up with your primary care physician on 10/20/2018 as advised for recheck, reevaluation as well as for repeat CBC and BMP blood test 5) you are taking apixaban/Eliquis which is a blood thinner so Avoid ibuprofen/Advil/Aleve/Motrin/Goody Powders/Naproxen/BC powders/Meloxicam/Diclofenac/Indomethacin and other Nonsteroidal anti-inflammatory medications as these will make you more likely to bleed and can cause stomach ulcers, can also cause Kidney problems.  6) please take all your medications exactly as prescribed--please call if any concerns about your medications 7) you have refused to go to skilled nursing facility for physical therapy rehab--- a therapist to come to the house couple times a week to help you get stronger   Increase activity slowly   Complete by: As directed         Discharge Medications     Allergies as of 10/18/2018      Reactions   Ciprofloxacin Other (See Comments)   Possibly caused diarrhea November 2018   Flagyl [metronidazole] Other (See Comments)   Possibly caused diarrhea November 2018   Iron Swelling   Ferrous Sulfate - tongue swelling    Papaya Derivatives Hives   Iodine Rash, Other (See Comments)   REACTION:If injected,  Rash/irritated skin reaction "welts"   Penicillins Hives   DID THE REACTION INVOLVE: Swelling of the face/tongue/throat, SOB, or low BP? Unknown Sudden or severe rash/hives, skin peeling, or the inside of the mouth or nose? Yes Did it require medical treatment? Yes When did it last happen?31 or 85 years old If all above answers are NO, may proceed with cephalosporin use.   Sulfa Antibiotics Rash       Medication List    STOP taking these medications   doxycycline 100 MG tablet  Commonly known as: VIBRA-TABS     TAKE these medications   acetaminophen 325 MG tablet Commonly known as: TYLENOL Take 2 tablets (650 mg total) by mouth every 6 (six) hours as needed for mild pain, fever or headache (or Fever >/= 101). What changed:   medication strength  how much to take  reasons to take this   apixaban 2.5 MG Tabs tablet Commonly known as: Eliquis Take 1 tablet (2.5 mg total) by mouth 2 (two) times daily. Hold onto Wednesday, 10/20/2018 Start taking on: October 20, 2018 What changed:   additional instructions  These instructions start on October 20, 2018. If you are unsure what to do until then, ask your doctor or other care provider.   carvedilol 6.25 MG tablet Commonly known as: COREG Take 1 tablet (6.25 mg total) by mouth 2 (two) times daily with a meal.   cefdinir 300 MG capsule Commonly known as: OMNICEF Take 1 capsule (300 mg total) by mouth 2 (two) times daily for 7 days.   furosemide 20 MG tablet Commonly known as: LASIX Take 1 tablet (20 mg total) by mouth daily.   metroNIDAZOLE 500 MG tablet Commonly known as: Flagyl Take 1 tablet (500 mg total) by mouth 3 (three) times daily for 7 days.   omeprazole 20 MG tablet Commonly known as: PRILOSEC OTC Take 1 tablet (20 mg total) by mouth daily. What changed:   when to take this  reasons to take this   ondansetron 4 MG tablet Commonly known as: ZOFRAN Take 1 tablet (4 mg total) by mouth every 6 (six) hours as needed for nausea.   oxyCODONE-acetaminophen 7.5-325 MG tablet Commonly known as: Percocet Take 1 tablet by mouth every 6 (six) hours as needed for moderate pain or severe pain.   potassium chloride 10 MEQ tablet Commonly known as: K-DUR Take 1 tablet (10 mEq total) by mouth daily. What changed:   medication strength  how much to take   PreserVision AREDS 2 Caps Take 1 capsule by mouth 2 (two)  times daily.   QUEtiapine 25 MG tablet Commonly known as: SEROQUEL Take 1 tablet (25 mg total) by mouth 2 (two) times daily. What changed:   medication strength  how much to take  when to take this   sodium chloride 0.65 % Soln nasal spray Commonly known as: OCEAN Place 1 spray 2 (two) times daily as needed into both nostrils for congestion.   Systane Ultra 0.4-0.3 % Soln Generic drug: Polyethyl Glycol-Propyl Glycol Place 1-2 drops 2 (two) times daily as needed into both eyes (dry eyes).   traZODone 150 MG tablet Commonly known as: DESYREL Take 1 tablet (150 mg total) by mouth at bedtime. What changed:   medication strength  when to take this  reasons to take this       Major procedures and Radiology Reports - PLEASE review detailed and final reports for all details, in brief -   Ct Abdomen Pelvis Wo Contrast  Result Date: 10/12/2018 CLINICAL DATA:  85 year old female with abdominal pain for 3-4 days, rectal bleeding for 2-3 weeks. EXAM: CT ABDOMEN AND PELVIS WITHOUT CONTRAST TECHNIQUE: Multidetector CT imaging of the abdomen and pelvis was performed following the standard protocol without IV contrast. COMPARISON:  CT Abdomen and [facility] 04/07/2018, and earlier. FINDINGS: Lower chest: Chronic cardiomegaly. Cardiac pacemaker leads. Chronic mild lung base scarring. No pericardial or pleural effusion. Hepatobiliary: Surgically absent gallbladder. Stable noncontrast liver with several small benign cysts or hemangiomas. Pancreas: Negative.  Spleen: Negative. Adrenals/Urinary Tract: Normal adrenal glands. Punctate right nephrolithiasis is stable. No hydronephrosis. Proximal ureters are decompressed. Mild bilateral perinephric stranding is chronic. Diminutive and unremarkable urinary bladder. Stomach/Bowel: Negative rectum. Mild mesenteric stranding about the sigmoid colon with severe underlying diverticulosis (series 2, image 60). Indistinct appearance of the  sigmoid in this region. No extraluminal gas or fluid. Diverticulosis continues in the descending colon with a 2nd area of questionable mild mesenteric inflammation on series 2, image 29. Redundant transverse colon also with diverticulosis, no active inflammation. No diverticulosis in the right colon. Negative terminal ileum. No dilated small bowel. Small to moderate gastric hiatal hernia is stable. No free air, free fluid. Vascular/Lymphatic: Extensive Aortoiliac calcified atherosclerosis. Vascular patency is not evaluated in the absence of IV contrast. No lymphadenopathy. Reproductive: Surgically absent. Other: No pelvic free fluid. Musculoskeletal: Osteopenia, scoliosis, spine degeneration. No acute osseous abnormality identified. IMPRESSION: 1. Severe diverticulosis of the descending and sigmoid colon. Mild Acute Diverticulitis in the mid sigmoid, and perhaps a 2nd site of active inflammation also in the mid descending colon. No perforation, abscess or complicating features. 2. No other acute or inflammatory process in the abdomen or pelvis. 3.  Aortic Atherosclerosis (ICD10-I70.0). Electronically Signed   By: Genevie Ann M.D.   On: 10/12/2018 22:53    Micro Results    Recent Results (from the past 240 hour(s))  Novel Coronavirus,NAA,(SEND-OUT TO REF LAB - TAT 24-48 hrs); Hosp Order     Status: None   Collection Time: 10/12/18 10:45 PM   Specimen: Nasopharyngeal Swab; Respiratory  Result Value Ref Range Status   SARS-CoV-2, NAA NOT DETECTED NOT DETECTED Final    Comment: (NOTE) This test was developed and its performance characteristics determined by [employer]. This test has not been FDA cleared or approved. This test has been authorized by FDA under an Emergency Use Authorization (EUA). This test is only authorized for the duration of time the declaration that circumstances exist justifying the authorization of the emergency use of in vitro diagnostic tests for detection of SARS-CoV-2  virus and/or diagnosis of COVID-19 infection under section 564(b)(1) of the Act, 21 U.S.C. 259DGL-8(V)(5), unless the authorization is terminated or revoked sooner. When diagnostic testing is negative, the possibility of a false negative result should be considered in the context of a patient's recent exposures and the presence of clinical signs and symptoms consistent with COVID-19. An individual without symptoms of COVID-19 and who is not shedding SARS-CoV-2 virus would expect to have a negative (not detected) result in this assay. Performed  At: [facility] [address] [place], [name] [id number] Rush Farmer MD AC:[id number]    [place]  Final    Comment: Performed at [facility], [address] [place], [place] [address]       Today   Subjective    Kimberly [name] today has no new complaints, eating or drinking well, had BM without blood,  No chest pains no palpitations   patient ambulating with walker          Patient has been seen and examined prior to discharge   Objective   Blood pressure (!) 150/74, pulse 70, temperature (!) 97.4 F (36.3 C), temperature source Oral, resp. rate 18, height 5\' 6"  (1.676 m), weight 68.2 kg, SpO2 100 %.   Intake/Output Summary (Last 24 hours) at 10/18/2018 1319 Last data filed at 10/18/2018 0900 Gross per 24 hour  Intake 940 ml  Output --  Net 940 ml    Exam  Gen:- Awake Alert, in no apparent distress  HEENT:- [place].AT, No sclera icterus Neck-Supple Neck,No JVD,.  Lungs-  CTAB , fair symmetrical air movement CV- S1, S2 normal, irregularly irregular Abd-  +ve B.Sounds, Abd Soft, much improved lower abdominal area tenderness without rebound or guarding Extremity- pedal pulses present  Psych-affect is appropriate, oriented x3, episodes of confusion disorientation and agitation from time to time--as per patient's daughter this is not new Neuro-generalized weakness, but no no new focal deficits,  no tremors Skin--- improving left lower extremity  swelling and erythema  over the fibular side of her left leg there is some dried up abrasion-without drainage   Data Review   CBC w Diff:  Lab Results  Component Value Date   WBC 6.4 10/17/2018   HGB 10.5 (L) 10/17/2018   HGB 13.5 03/15/2018   HCT 32.8 (L) 10/17/2018   HCT 39.9 03/15/2018   PLT 159 10/17/2018   PLT 176 03/15/2018   LYMPHOPCT 17 10/13/2018   MONOPCT 5 10/13/2018   EOSPCT 2 10/13/2018   BASOPCT 0 10/13/2018    CMP:  Lab Results  Component Value Date   NA 135 10/17/2018   NA 144 03/15/2018   K 4.5 10/17/2018   CL 106 10/17/2018   CO2 18 (L) 10/17/2018   BUN 9 10/17/2018   BUN 23 03/15/2018   CREATININE 1.09 (H) 10/17/2018   CREATININE 1.48 (H) 10/11/2012   PROT 7.1 10/12/2018   PROT 6.8 03/15/2018   ALBUMIN 4.1 10/12/2018   ALBUMIN 4.3 03/15/2018   BILITOT 0.7 10/12/2018   BILITOT 0.4 03/15/2018   ALKPHOS 62 10/12/2018   AST 26 10/12/2018   ALT 15 10/12/2018  . Total Discharge time is about 33 minutes  Roxan Hockey M.D on 10/18/2018 at 1:19 PM  Go to [email or web address] -  for contact info  [facility] - Office  [phone]

## 2018-10-17 NOTE — Evaluation (Signed)
Physical Therapy Evaluation Patient Details Name: Kimberly [name] MRN: [id number] DOB: 09-May-1926 Today's Date: 10/17/2018   History of Present Illness  Kimberly [name] is a 85 y.o. female with medical history significant for bipolar disorder, atrial fibrillation on Eliquis, chronic kidney disease stage III, anxiety, chronic diastolic CHF, and hypertension, now presenting to the emergency department for evaluation of lower abdominal pain and rectal bleeding.  Patient reports that she was recently evaluated at an outside hospital for these complaints, was diagnosed with acute diverticulitis, and due to her multiple listed drug allergies, she was started on doxycycline.  The patient reports continued, and worsening pain in the lower abdomen and increased rectal bleeding despite the antibiotic.  She denies any fevers or chills, denies nausea and vomiting, and denied lightheadedness or chest pain.    Clinical Impression  Patient found awake and alert sitting on toilet upon entering room. Patient with very resistant to cueing and recommendations throughout session. Patient required min guard with sit to stand and to ambulate in room and back to bed with patient reporting fatigue limiting her ambulation. Patient with very forward flexed trunk and resistant to cueing for improved safety with ambulation. Patient stood at bedside to put on ointment, and refused assistance, despite having very forward flexed posture making patient unsteady, with therapist and nurse standing guarding to increase safety. Patient performed bed mobility with modified independence. Therapist explained in detail the purpose and safety of putting on a bed alarm, however the patient refused the alarm. Discussed with patient recommendation to go to a SNF to improve strength and independence before returning home, however patient refused this. Patient would benefit from continued skilled physical therapy while at the hospital and at recommended  venue below in order to improve strength, balance, and overall independence with functional mobility.     Follow Up Recommendations Home health PT;SNF;Other (comment);Supervision/Assistance - 24 hour(First recommendation would be SNF, however patient refusing. Secondary recommendation, supervision assistance at home and HHPT.)    Equipment Recommendations       Recommendations for Other Services       Precautions / Restrictions Precautions Precautions: Fall Restrictions Weight Bearing Restrictions: No      Mobility  Bed Mobility Overal bed mobility: Modified Independent Bed Mobility: Sit to Supine       Sit to supine: Modified independent (Device/Increase time)      Transfers Overall transfer level: Needs assistance Equipment used: Rolling walker (2 wheeled) Transfers: Sit to/from Stand Sit to Stand: Min guard            Ambulation/Gait Ambulation/Gait assistance: Min guard Gait Distance (Feet): 15 Feet Assistive device: Rolling walker (2 wheeled) Gait Pattern/deviations: Step-through pattern;Decreased stride length;Trunk flexed Gait velocity: Decreased   General Gait Details: Patient very forward flexed at the trunk with ambulation.  Stairs            Wheelchair Mobility    Modified Rankin (Stroke Patients Only)       Balance Overall balance assessment: Needs assistance Sitting-balance support: Feet supported Sitting balance-Leahy Scale: Good     Standing balance support: During functional activity Standing balance-Leahy Scale: Fair Standing balance comment: Patient with very forward flexed trunk with ambulation making patient's balance decreased with functional movement.                             Pertinent Vitals/Pain Pain Assessment: Faces Faces Pain Scale: Hurts a little bit Pain Intervention(s): Limited activity within  patient's tolerance;Monitored during session    Home Living Family/patient expects to be discharged  to:: Private residence Living Arrangements: Alone Available Help at Discharge: Other (Comment)(Patient reported someone is going to be coming to her home regularly when she is discharged) Type of Home: House Home Access: Stairs to enter Entrance Stairs-Rails: None Entrance Stairs-Number of Steps: 1 Home Layout: One level Home Equipment: [place] - 2 wheels;Bedside commode      Prior Function Level of Independence: Independent with assistive device(s)         Comments: Patient reported using RW all the time and that she does everything on her own at home prior to coming to the hospital     Hand Dominance        Extremity/Trunk Assessment   Upper Extremity Assessment Upper Extremity Assessment: Generalized weakness    Lower Extremity Assessment Lower Extremity Assessment: Generalized weakness    Cervical / Trunk Assessment Cervical / Trunk Assessment: Kyphotic  Communication   Communication: No difficulties  Cognition Arousal/Alertness: Awake/alert Behavior During Therapy: WFL for tasks assessed/performed Overall Cognitive Status: Within Functional Limits for tasks assessed                                        General Comments      Exercises     Assessment/Plan    PT Assessment Patient needs continued PT services  PT Problem List Decreased strength;Decreased mobility;Decreased activity tolerance;Decreased balance       PT Treatment Interventions DME instruction;Gait training;Stair training;Functional mobility training;Therapeutic activities;Therapeutic exercise;Balance training;Neuromuscular re-education;Patient/family education    PT Goals (Current goals can be found in the Care Plan section)  Acute Rehab PT Goals Patient Stated Goal: To return home PT Goal Formulation: With patient Time For Goal Achievement: 10/24/18 Potential to Achieve Goals: Fair    Frequency Min 3X/week   Barriers to discharge        Co-evaluation                AM-PAC PT "6 Clicks" Mobility  Outcome Measure Help needed turning from your back to your side while in a flat bed without using bedrails?: None Help needed moving from lying on your back to sitting on the side of a flat bed without using bedrails?: None Help needed moving to and from a bed to a chair (including a wheelchair)?: A Little Help needed standing up from a chair using your arms (e.g., wheelchair or bedside chair)?: A Little Help needed to walk in hospital room?: A Little Help needed climbing 3-5 steps with a railing? : A Lot 6 Click Score: 19    End of Session   Activity Tolerance: Patient tolerated treatment well;Patient limited by fatigue;Treatment limited secondary to agitation Patient left: in bed;with call bell/phone within reach;Other (comment)(Other staff in room; patient refused bed alarm despite maximal cueing to put bed alarm on) Nurse Communication: Mobility status PT Visit Diagnosis: Unsteadiness on feet (R26.81);Other abnormalities of gait and mobility (R26.89);Muscle weakness (generalized) (M62.81)    Time: 0947-1000 PT Time Calculation (min) (ACUTE ONLY): 13 min   Charges:   PT Evaluation $PT Eval Low Complexity: 1 Low        Clarene Critchley PT, DPT 10:26 AM, 10/17/18 [phone]

## 2018-10-17 NOTE — Plan of Care (Signed)

## 2018-10-17 NOTE — Progress Notes (Signed)
RN paged C. Bodenheimer, NP to request something to help patient rest as patient is agitated at this time and unable to sleep.  Patient has been given Ativan 0.5 mg IV without change, awaiting response.  P.J. Linus Mako, RN

## 2018-10-17 NOTE — TOC Transition Note (Signed)
Transition of Care [facility]) - CM/SW Discharge Note   Patient Details  Name: Kimberly [name] MRN: [id number] Date of Birth: February 19, 1927  Transition of Care [facility]) CM/SW Contact:  Latanya Maudlin, RN Phone Number: 10/17/2018, 3:55 PM   Clinical Narrative:  Patient to be discharged per MD order. Orders in place for home health services. Previous RNCM had began workup for [facility] with [facility]. Notified Corene Cornea of discharge. No DME needs. Patient followed by [facility].      Final next level of care: [place] Barriers to Discharge: No Barriers Identified   Patient Goals and CMS Choice Patient states their goals for this hospitalization and ongoing recovery are:: To remain independent and return to her home. CMS Medicare.gov Compare Post Acute Care list provided to:: Patient Choice offered to / list presented to : Patient  Discharge Placement                       Discharge Plan and Services     Post Acute Care Choice: Durable Medical Equipment                    HH Arranged: PT, RN, Nurse's Aide, Social Work [employer] Agency: [place] ([facility]) Date [place]: 10/17/18 Time [place]: [place] Representative spoke with at [place]: [place] ([place]) Interventions     Readmission Risk Interventions Readmission Risk Prevention Plan 10/15/2018  Transportation Screening Complete  PCP or Specialist Appt within 3-5 Days Complete  HRI or [place] Complete  Social Work Consult for Independence Planning/Counseling Complete  Palliative Care Screening Not Applicable  Medication Review Press photographer) Complete  Some recent data might be hidden

## 2018-10-17 NOTE — Progress Notes (Signed)
Patient Demographics:    Kimberly [name], is a 85 y.o. female, DOB - Dec 31, 1926, ZYY:[id number]  Admit date - 10/12/2018   Admitting Physician Vianne Bulls, MD  Outpatient Primary MD for the patient is [name], DO  LOS - 4   Chief Complaint  Patient presents with   Rectal Bleeding        Subjective:    Lousie Calico today has no fevers, no emesis,  No chest pain, patient refusing to go to SNF rehab, patient also refusing to go home with home health----spoke with patient's daughter, trying to get social work involvement  Assessment  & Plan :    Principal Problem:   Acute diverticulitis Active Problems:   History of bipolar disorder   Permanent atrial fibrillation   Essential hypertension   CKD (chronic kidney disease) stage 3, GFR 30-59 ml/min (HCC)   Chronic diastolic heart failure (HCC)   Lower GI bleed  Brief Summary 85 year old female with past medical history relevant for bipolar disorder, atrial fibrillation on chronic anticoagulation with Eliquis, diastolic CHF, CKD 3, hypertension and anxiety disorder admitted with abdominal pain and rectal bleeding on 10/12/2018 and found to have diverticulitis and left lower extremity cellulitis  patient refusing to go to SNF rehab, patient also refusing to go home with home health----spoke with patient's daughter, trying to get social work involvement   A/p 1)AcuteDiverticulitis withBleeding--- no further bleeding, abdominal pain and tenderness has improved,, no further hematocheziaCT findings noted, no abscess or perforation, apparently failed outpatient doxycycline, overall improved with antibiotics, initially treated with Cipro and Flagyl, switched to Rocephin and  Flagyl,plan to discharge on Omnicef and Flagyl.  No fevers or leukocytosis.  Hemoglobin is stable at 10.5  2)Chronic Atrial Fibrillation--very high stroke risk with [place] VaSc  score of 5, Eliquis on hold due to GI bleed as above #1... Continue Coreg for rate control, may restart Eliquis on 10/20/2018 if no further bleeding.  Hemoglobin is stable at 10.5.  platelets 159  3)HFpEF--- no acute exacerbation of diastolic dysfunction CHF at this time, okay to resume home dose Lasix  at 20 mg daily ,    continue Coreg  4)Bipolar Disorder withConfusional Episodes---episodes of confusion and disorientation seizures in the evenings, query sundowning.[name] KitchenMarland KitchenMarland KitchenChange Seroquel to 25 mg twice daily and trazodone to 150 mg nightly.  Discussed with patient's daughter Ms. [name].... Patient has struggled for many many years with mood/bipolar disorders with episodes of mania from time to time... Current behavior is not unusual for her according to patient's daughter  5)CKD III--- much improved, creatinine is down to 1.0  from a baseline of 1.5,much improved renal function due to hydration, avoid nephrotoxic agents   6)Lt LE Cellulitis----improving, continue IV Rocephin for now, plan to discharge on p.o. Omnicef, recheck with PCP on 10/20/2018 if cellulitis fails to resolve as anticipated may switch to p.o. doxycycline to have some MRSA coverage. patient with questionable history of penicillin allergy,  as per pharmacist(Mr Coffee),patient did get [facility] in January 2020 so doubt significant cephalosporin allergy.  No fevers or leukocytosis  7) generalized weakness and debility-----PTA patient lives alone, gets around with a walker, physical therapist recommended SNF rehab, patient refuses to go to SNF for rehab she wants  to go home with home health services, discussed with patient's daughter [name], patient's daughter concurs.... Per patient and her daughter request we will discharge home with home health services including home med PT, RN and social worker  NB!!! After discharging patient---  patient refusing to go to SNF rehab, patient also refusing to  go home with home health----spoke with patient's daughter, trying to get social work involvement   Code Status:Full  Family Communication:  At patient's request D/w her neighbor/Friend [name], also d/w  her daughter Ms [name] --[phone],    left messages for her son  Disposition Plan:Home with home health, hemodynamically stable, H&H is stable--  patient refusing to go to SNF rehab, patient also refusing to go home with home health----spoke with patient's daughter, trying to get social work involvement   Consults :na  DVT Prophylaxis: - SCDs/TEDs (GI bleed)  Discharge Condition: stable* Code Status : full   DVT Prophylaxis  :    SCDs Lab Results  Component Value Date   PLT 159 10/17/2018    Inpatient Medications  Scheduled Meds:  carvedilol  6.25 mg Oral BID WC   oxyCODONE-acetaminophen  1 tablet Oral Once   pantoprazole  40 mg Oral Daily   QUEtiapine  25 mg Oral Q breakfast   sodium chloride flush  3 mL Intravenous Q12H   traZODone  100 mg Oral QHS   Continuous Infusions:  sodium chloride     sodium chloride 30 mL/hr at 10/15/18 1751   cefTRIAXone (ROCEPHIN)  IV 1 g (10/17/18 1308)   metronidazole 500 mg (10/17/18 1415)   PRN Meds:.sodium chloride, acetaminophen **OR** acetaminophen, HYDROcodone-acetaminophen, LORazepam, ondansetron **OR** ondansetron (ZOFRAN) IV, sodium chloride flush    Anti-infectives (From admission, onward)   Start     Dose/Rate Route Frequency Ordered Stop   10/17/18 0000  metroNIDAZOLE (FLAGYL) 500 MG tablet     500 mg Oral 3 times daily 10/17/18 1529 10/24/18 2359   10/17/18 0000  cefdinir (OMNICEF) 300 MG capsule     300 mg Oral 2 times daily 10/17/18 1529 10/24/18 2359   10/16/18 1300  ciprofloxacin (CIPRO) IVPB 400 mg  Status:  Discontinued     400 mg 200 mL/hr over 60 Minutes Intravenous Every 12 hours 10/16/18 0932 10/16/18 1237   10/16/18 1245  cefTRIAXone (ROCEPHIN) 1 g in sodium  chloride 0.9 % 100 mL IVPB     1 g 200 mL/hr over 30 Minutes Intravenous Every 24 hours 10/16/18 1237     10/14/18 0100  ciprofloxacin (CIPRO) IVPB 400 mg  Status:  Discontinued     400 mg 200 mL/hr over 60 Minutes Intravenous Every 24 hours 10/13/18 0805 10/16/18 0932   10/13/18 1830  ciprofloxacin (CIPRO) IVPB 400 mg  Status:  Discontinued     400 mg 200 mL/hr over 60 Minutes Intravenous Every 18 hours 10/13/18 0035 10/13/18 0803   10/13/18 0730  metroNIDAZOLE (FLAGYL) IVPB 500 mg     500 mg 100 mL/hr over 60 Minutes Intravenous Every 8 hours 10/13/18 0048     10/13/18 0015  ciprofloxacin (CIPRO) IVPB 400 mg     400 mg 200 mL/hr over 60 Minutes Intravenous  Once 10/13/18 0003 10/13/18 0259   10/12/18 2330  levofloxacin (LEVAQUIN) IVPB 750 mg  Status:  Discontinued     750 mg 100 mL/hr over 90 Minutes Intravenous Every 24 hours 10/12/18 2325 10/12/18 2349   10/12/18 2330  metroNIDAZOLE (FLAGYL) IVPB 500 mg  500 mg 100 mL/hr over 60 Minutes Intravenous  Once 10/12/18 2325 10/13/18 0133        Objective:   Vitals:   10/16/18 2118 10/17/18 0603 10/17/18 0604 10/17/18 1700  BP: (!) 116/56 (!) 148/83 (!) 148/83 (!) 152/71  Pulse: 70 77 74 76  Resp: 17 18 18    Temp: (!) 97.5 F (36.4 C)  98 F (36.7 C)   TempSrc: Oral  Oral   SpO2: 100% 100% 100%   Weight:   67.8 kg   Height:        Wt Readings from Last 3 Encounters:  10/17/18 67.8 kg  06/02/18 68.7 kg  05/06/18 66.7 kg     Intake/Output Summary (Last 24 hours) at 10/17/2018 1721 Last data filed at 10/17/2018 0558 Gross per 24 hour  Intake 1416.2 ml  Output --  Net 1416.2 ml     Physical Exam  Gen:- Awake Alert, in no apparent distress  HEENT:- [place].AT, No sclera icterus Neck-Supple Neck,No JVD,.  Lungs- CTAB , fair symmetrical air movement CV- S1, S2 normal, irregularly irregular Abd- +ve B.Sounds, Abd Soft, much improved lower abdominal area tenderness without rebound or guarding Extremity-pedal pulses  present  Psych-affect is appropriate, oriented x3, episodes of confusion disorientation and agitation from time to time--as per patient's daughter this is not new Neuro-generalized weakness, but nono new focal deficits, no tremors Skin--- improvingleft lower extremity  swelling and erythema  over the fibular side of her left leg there is some dried up abrasion-without drainage   Data Review:   Micro Results Recent Results (from the past 240 hour(s))  Novel Coronavirus,NAA,(SEND-OUT TO REF LAB - TAT 24-48 hrs); Hosp Order     Status: None   Collection Time: 10/12/18 10:45 PM   Specimen: Nasopharyngeal Swab; Respiratory  Result Value Ref Range Status   SARS-CoV-2, NAA NOT DETECTED NOT DETECTED Final    Comment: (NOTE) This test was developed and its performance characteristics determined by [employer]. This test has not been FDA cleared or approved. This test has been authorized by FDA under an Emergency Use Authorization (EUA). This test is only authorized for the duration of time the declaration that circumstances exist justifying the authorization of the emergency use of in vitro diagnostic tests for detection of SARS-CoV-2 virus and/or diagnosis of COVID-19 infection under section 564(b)(1) of the Act, 21 U.S.C. 224MGN-0(I)(3), unless the authorization is terminated or revoked sooner. When diagnostic testing is negative, the possibility of a false negative result should be considered in the context of a patient's recent exposures and the presence of clinical signs and symptoms consistent with COVID-19. An individual without symptoms of COVID-19 and who is not shedding SARS-CoV-2 virus would expect to have a negative (not detected) result in this assay. Performed  At: [facility] [place], [name] [id number] Rush Farmer MD XI:[id number]    [place]  Final    Comment: Performed at [facility], [address],  [place], [place] [address]    Radiology Reports Ct Abdomen Pelvis Wo Contrast  Result Date: 10/12/2018 CLINICAL DATA:  85 year old female with abdominal pain for 3-4 days, rectal bleeding for 2-3 weeks. EXAM: CT ABDOMEN AND PELVIS WITHOUT CONTRAST TECHNIQUE: Multidetector CT imaging of the abdomen and pelvis was performed following the standard protocol without IV contrast. COMPARISON:  CT Abdomen and [facility] 04/07/2018, and earlier. FINDINGS: Lower chest: Chronic cardiomegaly. Cardiac pacemaker leads. Chronic mild lung base scarring. No pericardial or pleural effusion. Hepatobiliary:  Surgically absent gallbladder. Stable noncontrast liver with several small benign cysts or hemangiomas. Pancreas: Negative. Spleen: Negative. Adrenals/Urinary Tract: Normal adrenal glands. Punctate right nephrolithiasis is stable. No hydronephrosis. Proximal ureters are decompressed. Mild bilateral perinephric stranding is chronic. Diminutive and unremarkable urinary bladder. Stomach/Bowel: Negative rectum. Mild mesenteric stranding about the sigmoid colon with severe underlying diverticulosis (series 2, image 60). Indistinct appearance of the sigmoid in this region. No extraluminal gas or fluid. Diverticulosis continues in the descending colon with a 2nd area of questionable mild mesenteric inflammation on series 2, image 29. Redundant transverse colon also with diverticulosis, no active inflammation. No diverticulosis in the right colon. Negative terminal ileum. No dilated small bowel. Small to moderate gastric hiatal hernia is stable. No free air, free fluid. Vascular/Lymphatic: Extensive Aortoiliac calcified atherosclerosis. Vascular patency is not evaluated in the absence of IV contrast. No lymphadenopathy. Reproductive: Surgically absent. Other: No pelvic free fluid. Musculoskeletal: Osteopenia, scoliosis, spine degeneration. No acute osseous abnormality identified. IMPRESSION: 1. Severe diverticulosis of  the descending and sigmoid colon. Mild Acute Diverticulitis in the mid sigmoid, and perhaps a 2nd site of active inflammation also in the mid descending colon. No perforation, abscess or complicating features. 2. No other acute or inflammatory process in the abdomen or pelvis. 3.  Aortic Atherosclerosis (ICD10-I70.0). Electronically Signed   By: Genevie Ann M.D.   On: 10/12/2018 22:53     CBC Recent Labs  Lab 10/12/18 1401 10/13/18 0205 10/14/18 0616 10/16/18 0619 10/17/18 0854  WBC 9.9 8.4 6.9 5.8 6.4  HGB 11.7* 11.4* 10.3* 10.3* 10.5*  HCT 37.2 36.3 32.9* 31.9* 32.8*  PLT 187 181 167 160 159  MCV 96.6 97.6 97.3 98.2 99.1  MCH 30.4 30.6 30.5 31.7 31.7  MCHC 31.5 31.4 31.3 32.3 32.0  RDW 20.7* 20.3* 20.2* 20.4* 20.9*  LYMPHSABS  --  1.4  --   --   --   MONOABS  --  0.4  --   --   --   EOSABS  --  0.2  --   --   --   BASOSABS  --  0.0  --   --   --     Chemistries  Recent Labs  Lab 10/12/18 1401 10/13/18 0205 10/14/18 0616 10/16/18 0619 10/17/18 0854  NA 139 139 138 140 135  K 4.1 3.8 4.2 4.3 4.5  CL 102 105 107 110 106  CO2 25 22 21* 23 18*  GLUCOSE 87 121* 103* 90 100*  BUN 26* 24* 18 9 9   CREATININE 1.30* 1.38* 1.27* 1.15* 1.09*  CALCIUM 9.0 8.7* 8.4* 8.6* 8.5*  AST 26  --   --   --   --   ALT 15  --   --   --   --   ALKPHOS 62  --   --   --   --   BILITOT 0.7  --   --   --   --    ------------------------------------------------------------------------------------------------------------------ No results for input(s): CHOL, HDL, LDLCALC, TRIG, CHOLHDL, LDLDIRECT in the last 72 hours.  Lab Results  Component Value Date   HGBA1C 5.8 (H) 12/15/2011   ------------------------------------------------------------------------------------------------------------------ No results for input(s): TSH, T4TOTAL, T3FREE, THYROIDAB in the last 72 hours.  Invalid input(s):  FREET3 ------------------------------------------------------------------------------------------------------------------ No results for input(s): VITAMINB12, FOLATE, FERRITIN, TIBC, IRON, RETICCTPCT in the last 72 hours.  Coagulation profile No results for input(s): INR, PROTIME in the last 168 hours.  No results for input(s): DDIMER in the last 72 hours.  Cardiac  Enzymes No results for input(s): CKMB, TROPONINI, MYOGLOBIN in the last 168 hours.  Invalid input(s): CK ------------------------------------------------------------------------------------------------------------------    Component Value Date/Time   BNP 270.0 (H) 04/25/2018 2005     Roxan Hockey M.D on 10/17/2018 at 5:21 PM  Go to [email or web address] - for contact info  [facility] - Office  [phone]

## 2018-10-17 NOTE — Discharge Instructions (Signed)
Acute diverticulitis/cellulitis with generalized weakness and debility    1) please take Omnicef/cefdinir and Flagyl/metronidazole-your antibiotics for bowel infection and left leg infection--- if left leg infection/redness fails to resolve you may need to be switched to oral doxycycline to cover for possible MRSA infection 2) please follow-up with your primary care physician on Wednesday, 10/20/2018 for recheck and reevaluation 3) please hold Eliquis/apixaban blood thinner until Wednesday, 10/20/2018 at which time you may restart it if no further bleeding from your bowel 4) you need to follow-up with your primary care physician on 10/20/2018 as advised for recheck, reevaluation as well as for repeat CBC and BMP blood test 5) you are taking apixaban/Eliquis which is a blood thinner so Avoid ibuprofen/Advil/Aleve/Motrin/Goody Powders/Naproxen/BC powders/Meloxicam/Diclofenac/Indomethacin and other Nonsteroidal anti-inflammatory medications as these will make you more likely to bleed and can cause stomach ulcers, can also cause Kidney problems.  6) please take all your medications exactly as prescribed--please call if any concerns about your medications 7) you have refused to go to skilled nursing facility for physical therapy rehab--- a therapist to come to the house couple times a week to help you get stronger

## 2018-10-17 NOTE — Progress Notes (Signed)
Patient is refusing discharge at this time, MD made aware of this situation. Patient states that she is "too weak" also stating " I can't walk or stand on my own". However patient has been walking to bedside commode and has walked in room with the assist of her own walker today while refusing assistance of any nursing staff. This RN asked if the patient was willing to consider rehab as a discharge option since the patient stated she "is not safe for me to go home by myself", and patient refused. Patient is sitting in bed and refuses to allow the bed alarm on even after teaching on safety of bed alarm. MD and [facility] is made aware of this situation.

## 2018-10-18 LAB — GLUCOSE, CAPILLARY: Glucose-Capillary: 81 mg/dL (ref 70–99)

## 2018-10-18 NOTE — Care Management Important Message (Signed)
Important Message  Patient Details  Name: Kimberly [name] MRN: [id number] Date of Birth: 1927-02-04   Medicare Important Message Given:  Yes    Tommy Medal 10/18/2018, 12:39 PM

## 2018-10-18 NOTE — Plan of Care (Signed)

## 2018-10-18 NOTE — Progress Notes (Signed)
Patient continues to refuse application of bed alarm.

## 2018-10-18 NOTE — Progress Notes (Signed)
CSW in contact with Pt's neighbor, [place] [phone] to confirm that she could assist pt with ambulating inside home. Neighbor agreed to assist pt upon arrival.   Wilburt Finlay Transitions of Care  Clinical Social Worker  Ph: [phone]

## 2018-10-18 NOTE — Progress Notes (Signed)
This RN at bedside with MD.  MD thoroughly explained to patient that she is being discharged today and his recommendation is that she go to a SNF for therapy rather than go home.  Patient is adament that she is going home and is not willing under any circumstances to go to a SNF.  MD explained his concern for her being able to perform ADL's unassisted.  Patient continues to refuse SNF stating she has always taken care of herself and will continue to do so.  MD again stated this was her right to decide to go home but he strongly felt is was in her better interest to go to SNF.  Patient will be discharged home today according to her wishes.

## 2018-10-18 NOTE — Progress Notes (Signed)
Physical Therapy Treatment Patient Details Name: Kimberly [name] MRN: [id number] DOB: Apr 17, 1927 Today's Date: 10/18/2018    History of Present Illness Kimberly [name] is a 85 y.o. female with medical history significant for bipolar disorder, atrial fibrillation on Eliquis, chronic kidney disease stage III, anxiety, chronic diastolic CHF, and hypertension, now presenting to the emergency department for evaluation of lower abdominal pain and rectal bleeding.  Patient reports that she was recently evaluated at an outside hospital for these complaints, was diagnosed with acute diverticulitis, and due to her multiple listed drug allergies, she was started on doxycycline.  The patient reports continued, and worsening pain in the lower abdomen and increased rectal bleeding despite the antibiotic.  She denies any fevers or chills, denies nausea and vomiting, and denied lightheadedness or chest pain.    PT Comments    Patient agreeable and cooperative for therapy, demonstrates increased endurance/distance for gait training with slow labored cadence, no loss of balance, and limited mostly due to c/o fatigue.   Patient transferred to commode in bathroom without loss of balance, required assistance to put on pants and socks while sitting on commode and tolerated sitting up in chair after ambulating in hallway.  Patient will benefit from continued physical therapy in hospital and recommended venue below to increase strength, balance, endurance for safe ADLs and gait.   Follow Up Recommendations  SNF;Supervision for mobility/OOB;Supervision - Intermittent     Equipment Recommendations  None recommended by PT    Recommendations for Other Services       Precautions / Restrictions Precautions Precautions: Fall Restrictions Weight Bearing Restrictions: No    Mobility  Bed Mobility Overal bed mobility: Modified Independent Bed Mobility: Supine to Sit           General bed mobility comments: increased  time  Transfers Overall transfer level: Needs assistance Equipment used: Rolling walker (2 wheeled) Transfers: Sit to/from [employer] Transfers Sit to Stand: Supervision;Min guard Stand pivot transfers: Supervision;Min guard       General transfer comment: increased time, labored movement  Ambulation/Gait Ambulation/Gait assistance: Min guard;Supervision Gait Distance (Feet): 45 Feet Assistive device: Rolling walker (2 wheeled) Gait Pattern/deviations: Decreased step length - right;Decreased step length - left;Decreased stride length Gait velocity: decreased   General Gait Details: slow labored cadence without loss of balance, limited mostly due to c/o fatigue   Stairs             Wheelchair Mobility    Modified Rankin (Stroke Patients Only)       Balance Overall balance assessment: Needs assistance Sitting-balance support: Feet supported;No upper extremity supported Sitting balance-Leahy Scale: Good     Standing balance support: During functional activity;No upper extremity supported Standing balance-Leahy Scale: Poor Standing balance comment: fair using RW                            Cognition Arousal/Alertness: Awake/alert Behavior During Therapy: WFL for tasks assessed/performed Overall Cognitive Status: Within Functional Limits for tasks assessed                                        Exercises      General Comments        Pertinent Vitals/Pain Pain Assessment: Faces Faces Pain Scale: Hurts little more Pain Location: left anterior lower leg and foot Pain Descriptors / Indicators: Sore;Grimacing;Guarding Pain Intervention(s):  Limited activity within patient's tolerance;Monitored during session    Home Living                      Prior Function            PT Goals (current goals can now be found in the care plan section) Acute Rehab PT Goals Patient Stated Goal: To return home PT Goal  Formulation: With patient Time For Goal Achievement: 10/24/18 Potential to Achieve Goals: Good Progress towards PT goals: Progressing toward goals    Frequency    Min 3X/week      PT Plan Current plan remains appropriate    Co-evaluation              AM-PAC PT "6 Clicks" Mobility   Outcome Measure  Help needed turning from your back to your side while in a flat bed without using bedrails?: None Help needed moving from lying on your back to sitting on the side of a flat bed without using bedrails?: None Help needed moving to and from a bed to a chair (including a wheelchair)?: A Little Help needed standing up from a chair using your arms (e.g., wheelchair or bedside chair)?: A Little Help needed to walk in hospital room?: A Little Help needed climbing 3-5 steps with a railing? : A Lot 6 Click Score: 19    End of Session   Activity Tolerance: Patient tolerated treatment well;Patient limited by fatigue Patient left: in chair;with bed alarm set;with call bell/phone within reach Nurse Communication: Mobility status PT Visit Diagnosis: Unsteadiness on feet (R26.81);Other abnormalities of gait and mobility (R26.89);Muscle weakness (generalized) (M62.81)     Time: 1025-8527 PT Time Calculation (min) (ACUTE ONLY): 30 min  Charges:  $Gait Training: 8-22 mins $Therapeutic Activity: 8-22 mins                     12:59 PM, 10/18/18 Lonell Grandchild, MPT Physical Therapist with [facility] [phone] office [phone] mobile phone

## 2018-10-18 NOTE — Progress Notes (Signed)
Patient seen and evaluated, chart reviewed, please see EMR for updated orders. Please see full discharge summary   Despite repeated persuasion patient refuses to go to SNF rehab, she has decided to go home, home health services have been arranged, patient daughter aware of patient decision to refuse SNF rehab  Roxan Hockey, MD

## 2018-10-19 ENCOUNTER — Telehealth: Payer: Self-pay | Admitting: Family Medicine

## 2018-10-19 ENCOUNTER — Telehealth: Payer: Self-pay | Admitting: Cardiovascular Disease

## 2018-10-19 ENCOUNTER — Other Ambulatory Visit: Payer: Self-pay

## 2018-10-19 NOTE — Telephone Encounter (Signed)
Pt called in asking to speak to me, spoke to pt she was letting me know she recently was discharged from hospital. She then states that she fell getting out of her jet tub and her whole body hurts, states that she didn't hit her head, I asked the pt when she fell and she states that she doesn't remember if it was yesterday or day before. Pt is scheduled to see Dr Merita Norton 6/17 for hospital follow up.

## 2018-10-19 NOTE — Telephone Encounter (Signed)
Spoke with pt regarding fall Pt was taken to hospital by EMS and evaluated Pt has hospital follow up with Dr Darnell Level on 10/20/2018

## 2018-10-19 NOTE — Telephone Encounter (Signed)
Spoke with pt, aware of the recommendations by pharm md.

## 2018-10-19 NOTE — Telephone Encounter (Signed)
Pt c/o medication issue:  1. Name of Medication:  ondansetron (ZOFRAN) 4 MG tablet cefdinir (OMNICEF) 300 MG capsule metroNIDAZOLE (FLAGYL) 500 MG tablet traZODone (DESYREL) 150 MG tablet QUEtiapine (SEROQUEL) 25 MG tablet  2. How are you currently taking this medication (dosage and times per day)?  She is not taking any of these currently  3. Are you having a reaction (difficulty breathing--STAT)? no  4. What is your medication issue? Patient is unsure if she should still be taking these medications. She recently got these medications and she wants to know if she should be taking them. She will not take anything until she hears from the office  Please call her to advise

## 2018-10-19 NOTE — Telephone Encounter (Signed)
Will forward to the pharm md to review and advise ?

## 2018-10-19 NOTE — Telephone Encounter (Signed)
She can take the ondansetron when needed for nausea.  The Cefdinir and metronidazole she needs to take until the current bottles are empty.  The trazodone and seroquel she needs to take until told otherwise by PCP/psych

## 2018-10-20 ENCOUNTER — Emergency Department (HOSPITAL_COMMUNITY)
Admission: EM | Admit: 2018-10-20 | Discharge: 2018-10-20 | Disposition: A | Discharge status: Home / Self Care | Payer: Medicare Other | Attending: Emergency Medicine | Admitting: Emergency Medicine

## 2018-10-20 ENCOUNTER — Telehealth: Payer: Self-pay | Admitting: *Deleted

## 2018-10-20 ENCOUNTER — Emergency Department (HOSPITAL_COMMUNITY): Payer: Medicare Other

## 2018-10-20 ENCOUNTER — Ambulatory Visit: Payer: Medicare Other | Admitting: Family Medicine

## 2018-10-20 ENCOUNTER — Other Ambulatory Visit: Payer: Self-pay

## 2018-10-20 ENCOUNTER — Encounter (HOSPITAL_COMMUNITY): Payer: Self-pay | Admitting: Emergency Medicine

## 2018-10-20 DIAGNOSIS — S8991XA Unspecified injury of right lower leg, initial encounter: Secondary | ICD-10-CM | POA: Diagnosis not present

## 2018-10-20 DIAGNOSIS — Z79891 Long term (current) use of opiate analgesic: Secondary | ICD-10-CM | POA: Diagnosis not present

## 2018-10-20 DIAGNOSIS — Z7901 Long term (current) use of anticoagulants: Secondary | ICD-10-CM | POA: Diagnosis not present

## 2018-10-20 DIAGNOSIS — Z87891 Personal history of nicotine dependence: Secondary | ICD-10-CM | POA: Diagnosis not present

## 2018-10-20 DIAGNOSIS — R609 Edema, unspecified: Secondary | ICD-10-CM | POA: Diagnosis not present

## 2018-10-20 DIAGNOSIS — W19XXXA Unspecified fall, initial encounter: Secondary | ICD-10-CM | POA: Diagnosis not present

## 2018-10-20 DIAGNOSIS — S8012XA Contusion of left lower leg, initial encounter: Secondary | ICD-10-CM | POA: Diagnosis not present

## 2018-10-20 DIAGNOSIS — I5042 Chronic combined systolic (congestive) and diastolic (congestive) heart failure: Secondary | ICD-10-CM | POA: Diagnosis not present

## 2018-10-20 DIAGNOSIS — Y999 Unspecified external cause status: Secondary | ICD-10-CM | POA: Diagnosis not present

## 2018-10-20 DIAGNOSIS — K259 Gastric ulcer, unspecified as acute or chronic, without hemorrhage or perforation: Secondary | ICD-10-CM | POA: Diagnosis not present

## 2018-10-20 DIAGNOSIS — Y939 Activity, unspecified: Secondary | ICD-10-CM | POA: Insufficient documentation

## 2018-10-20 DIAGNOSIS — I130 Hypertensive heart and chronic kidney disease with heart failure and stage 1 through stage 4 chronic kidney disease, or unspecified chronic kidney disease: Secondary | ICD-10-CM | POA: Insufficient documentation

## 2018-10-20 DIAGNOSIS — N183 Chronic kidney disease, stage 3 (moderate): Secondary | ICD-10-CM | POA: Insufficient documentation

## 2018-10-20 DIAGNOSIS — M1990 Unspecified osteoarthritis, unspecified site: Secondary | ICD-10-CM | POA: Diagnosis not present

## 2018-10-20 DIAGNOSIS — S8990XA Unspecified injury of unspecified lower leg, initial encounter: Secondary | ICD-10-CM | POA: Diagnosis present

## 2018-10-20 DIAGNOSIS — S8011XA Contusion of right lower leg, initial encounter: Secondary | ICD-10-CM | POA: Insufficient documentation

## 2018-10-20 DIAGNOSIS — R52 Pain, unspecified: Secondary | ICD-10-CM | POA: Diagnosis not present

## 2018-10-20 DIAGNOSIS — Z950 Presence of cardiac pacemaker: Secondary | ICD-10-CM | POA: Diagnosis not present

## 2018-10-20 DIAGNOSIS — S8992XA Unspecified injury of left lower leg, initial encounter: Secondary | ICD-10-CM | POA: Diagnosis not present

## 2018-10-20 DIAGNOSIS — S8010XA Contusion of unspecified lower leg, initial encounter: Secondary | ICD-10-CM

## 2018-10-20 DIAGNOSIS — I4821 Permanent atrial fibrillation: Secondary | ICD-10-CM | POA: Diagnosis not present

## 2018-10-20 DIAGNOSIS — L0390 Cellulitis, unspecified: Secondary | ICD-10-CM | POA: Diagnosis not present

## 2018-10-20 DIAGNOSIS — M7989 Other specified soft tissue disorders: Secondary | ICD-10-CM | POA: Diagnosis not present

## 2018-10-20 DIAGNOSIS — Z7401 Bed confinement status: Secondary | ICD-10-CM | POA: Diagnosis not present

## 2018-10-20 DIAGNOSIS — M25562 Pain in left knee: Secondary | ICD-10-CM | POA: Diagnosis not present

## 2018-10-20 DIAGNOSIS — K5791 Diverticulosis of intestine, part unspecified, without perforation or abscess with bleeding: Secondary | ICD-10-CM | POA: Diagnosis not present

## 2018-10-20 DIAGNOSIS — Z9181 History of falling: Secondary | ICD-10-CM | POA: Diagnosis not present

## 2018-10-20 DIAGNOSIS — H409 Unspecified glaucoma: Secondary | ICD-10-CM | POA: Diagnosis not present

## 2018-10-20 DIAGNOSIS — L03116 Cellulitis of left lower limb: Secondary | ICD-10-CM | POA: Diagnosis not present

## 2018-10-20 DIAGNOSIS — Z79899 Other long term (current) drug therapy: Secondary | ICD-10-CM | POA: Insufficient documentation

## 2018-10-20 DIAGNOSIS — Y929 Unspecified place or not applicable: Secondary | ICD-10-CM | POA: Insufficient documentation

## 2018-10-20 DIAGNOSIS — I10 Essential (primary) hypertension: Secondary | ICD-10-CM | POA: Diagnosis not present

## 2018-10-20 DIAGNOSIS — K208 Other esophagitis: Secondary | ICD-10-CM | POA: Diagnosis not present

## 2018-10-20 MED ORDER — TRAMADOL HCL 50 MG PO TABS
50.0000 mg | ORAL_TABLET | Freq: Once | ORAL | Status: AC
  Administered 2018-10-20: 20:00:00 50 mg via ORAL
  Filled 2018-10-20: qty 1

## 2018-10-20 MED ORDER — TRAMADOL HCL 50 MG PO TABS: 50.0000 mg | ORAL_TABLET | Freq: Four times a day (QID) | ORAL | 0 refills | Status: DC | PRN

## 2018-10-20 MED ORDER — ACETAMINOPHEN 325 MG PO TABS
650.0000 mg | ORAL_TABLET | Freq: Once | ORAL | Status: DC
  Filled 2018-10-20: qty 2

## 2018-10-20 NOTE — Telephone Encounter (Signed)
She was admitted for significant GI infection/ bleed.  She should go to ED if she is declining again.  I strongly advise that she be discharged to a nursing facility to help with medicines, monitoring, rehab, as I do not think that patient is able to care for herself.

## 2018-10-20 NOTE — ED Notes (Signed)
Ems called to transport

## 2018-10-20 NOTE — Telephone Encounter (Signed)
97 % 64 160/64 96.3 Vital today at 1 pm

## 2018-10-20 NOTE — ED Notes (Signed)
RN to give Tyl for leg pain. Pt refused saying that it won't help but would not state what medication she would be willing to take or what medications she has taken that would help

## 2018-10-20 NOTE — ED Notes (Signed)
Ems arrived to get patient

## 2018-10-20 NOTE — ED Triage Notes (Signed)
Patient brought in by EMS for c/o bilat leg pain. Patient has reddened, edematous legs.

## 2018-10-20 NOTE — Telephone Encounter (Signed)
Noticed these changes yesterday. -can not sleep   Please advise.  Aware to stop both antibiotics until she hears from [place].   Does not want to call EMS - she was advised to do so.

## 2018-10-20 NOTE — ED Notes (Signed)
Pt to XR

## 2018-10-20 NOTE — Telephone Encounter (Signed)
Also states she has her BM is "mushy", not diarrhea.

## 2018-10-20 NOTE — Discharge Instructions (Addendum)
Return if any problems.

## 2018-10-20 NOTE — Telephone Encounter (Signed)
Pt aware of DR Lajuana Ripple recommendation to go to the ER for re-eval.  She is feeling some better now, but still agrees that going back to ER may be the best idea.  I did speak with [place] with [facility] and she will also re- address the need to call EMS as she does not have a ride to go otherwise.

## 2018-10-20 NOTE — ED Notes (Signed)
Pt returned from xr

## 2018-10-20 NOTE — ED Provider Notes (Signed)
[facility] EMERGENCY DEPARTMENT Provider Note   CSN: [id number] Arrival date & time: 10/20/18  1541     History   Chief Complaint Chief Complaint  Patient presents with   Leg Pain    HPI Kimberly [name] is a 85 y.o. female.     The history is provided by the patient. No language interpreter was used.  Leg Pain Location:  Knee Time since incident:  1 day Injury: yes   Mechanism of injury: fall   Fall:    Point of impact:  Knees   Entrapped after fall: no   Knee location:  L knee and R knee  Pt reports Kimberly [name] fell and hit her knees but Kimberly [name] does not remember when.  Pt complains of pain in knees and down legs.  Past Medical History:  Diagnosis Date   Acute blood loss anemia 10/11/2012   Acute diverticulitis 08/24/2013   Acute on chronic combined systolic and diastolic CHF, NYHA class 4 (HCC) 11/15/2013   Antral ulcer 10/11/2012   Arrhythmia    atrial fibb   Atrial fibrillation (HCC)    Cardiomyopathy, nonischemic (HCC)    Chronic anticoagulation 10/12/2012   CKD (chronic kidney disease) stage 3, GFR 30-59 ml/min (HCC) 10/12/2012   Depression    Erosive esophagitis 10/11/2012   Fibromyalgia    Glaucoma    H/O echocardiogram 2007   EF 40-45%,          Hypertension    Osteoarthritis    Pacemaker    Last saw cards 07/2013   Scoliosis     Patient Active Problem List   Diagnosis Date Noted   Acute GI bleeding    Enteritis of small intestine due to enterotoxigenic Escherichia coli associated with diarrhea 03/24/2017   Bipolar affective disorder ([place]) 03/23/2017   Olfactory hallucinations 03/23/2017   Lower GI bleed    Glaucoma 02/28/2017   Pacemaker 03/21/2015   Non-ischemic cardiomyopathy ([place]) 11/28/2014   PUD (peptic ulcer disease) 06/14/2014   History of bipolar disorder    Hypokalemia 05/26/2014   Fall at home 05/26/2014   Chronic diastolic heart failure ([place]) 11/15/2013   Complete heart block ([place]) 11/15/2013   Inability to  ambulate due to ankle or foot 09/05/2013   Unspecified constipation 08/26/2013   Anemia 08/25/2013   Acute diverticulitis 08/24/2013   CKD (chronic kidney disease) stage 3, GFR 30-59 ml/min (HCC) 10/12/2012   Chronic anticoagulation 10/12/2012   Osteoarthritis    Permanent atrial fibrillation    Biventricular cardiac pacemaker - [employer] Consulta    Fibromyalgia    Essential hypertension    Depression     Past Surgical History:  Procedure Laterality Date   ABDOMINAL HYSTERECTOMY     APPENDECTOMY     BACK SURGERY     BIOPSY  03/03/2017   Procedure: BIOPSY;  Surgeon: Danie Binder, MD;  Location: [facility];  Service: Endoscopy;;  gastric   BREAST SURGERY     CARDIAC CATHETERIZATION  12/08/2005   LAD AND LEFT MAIN WITH NO HIGH-GRADE STENOSIS. MILD DISEASE IN THE CX AND LAD SYSTEM. SEVERE LV DYSFUNCTION WITH DILATION OF THE LV. EF 15-20%. LV END-DIASTOLIC PRESSURE IS 90. +1 MR.   CHOLECYSTECTOMY     COLONOSCOPY N/A 03/03/2017   Procedure: COLONOSCOPY;  Surgeon: Danie Binder, MD;  Location: [facility];  Service: Endoscopy;  Laterality: N/A;   CYSTOSCOPY N/A 02/24/2013   Procedure: CYSTOSCOPY WITH URETHRAL DILITATION;  Surgeon: Marissa Nestle, MD;  Location: [facility] ORS;  Service: Urology;  Laterality: N/A;   DOPPLER ECHOCARDIOGRAPHY N/A 05/30/2010   LV SIZE IS NORMAL. LV SYSTOLIC FUNCTION IS LOW NORMAL. EF=50-55%. MILD INFERIOR HYPOKINESIS.MILD TO MODERATE POSTERIOR WALL HYPOKINESIS.PACEMAKER LEAD IN THE RV. LA IS MILDLY DILATED. RA IS MODERATE TO SEVERLY DILATED. PACEMAKER LEAD IN THE RA. MILD CALCICICATION OF THE MV APPARATUS. MODERATE MR. MILD TO MODERATE TR. MILD PHTN.AV MILDLY SCLEROTIC.   ESOPHAGOGASTRODUODENOSCOPY N/A 10/13/2012   Dr. Gala Romney: severe ulcerative reflux esophagitis, question of Barrett's but negative path, single deep prepyloric antral ulcer, negative H.pylori   ESOPHAGOGASTRODUODENOSCOPY N/A 03/03/2017   Procedure:  ESOPHAGOGASTRODUODENOSCOPY (EGD);  Surgeon: Danie Binder, MD;  Location: [facility];  Service: Endoscopy;  Laterality: N/A;   HERNIA REPAIR     right inguinal hernia and umbilical   LOWETR EXT VENOUS Bilateral 11-08-10   R & L- NO EVIDENCE OF THROMBUS OR THROMBOPHLEBITIS. THERE IS MILD AMOUNT OF SUBCUTANEOUS EDEMA NOTED WITHIN THE LEFT CALF AND ANKLE. R & L GSV AND SSV- NO VENOUS INSUFF NOTED.   NECK SURGERY     NUCLEAR STRESS TEST N/A 02/13/2009   NORMAL PATTERN OF PERFUSION IN ALL REGIONS. POST STRESS VENTICULAR SIZE IS NORMAL. POST  STESS EF 85%.  NORMAL MYOCARDIAL PERFUSION STUDY.   PACEMAKER INSERTION     POLYPECTOMY  03/03/2017   Procedure: POLYPECTOMY;  Surgeon: Danie Binder, MD;  Location[facility];  Service: Endoscopy;;  colon   TONSILLECTOMY     YAG LASER APPLICATION Bilateral 6/73/4193   Procedure: YAG LASER APPLICATION;  Surgeon: Williams Che, MD;  Location: [facility] OR[facility];  Service: Ophthalmology;  Laterality: Bilateral;     OB History    Gravida  5   Para  2   Term  1   Preterm  1   AB  3   Living  2     SAB  3   TAB      Ectopic      Multiple      Live Births               Home Medications    Prior to Admission medications   Medication Sig Start Date End Date Taking? Authorizing Provider  acetaminophen (TYLENOL) 325 MG tablet Take 2 tablets (650 mg total) by mouth every 6 (six) hours as needed for mild pain, fever or headache (or Fever >/= 101). 10/17/18   Roxan Hockey, MD  apixaban (ELIQUIS) 2.5 MG TABS tablet Take 1 tablet (2.5 mg total) by mouth 2 (two) times daily. Hold onto Wednesday, 10/20/2018 10/20/18   Roxan Hockey, MD  carvedilol (COREG) 6.25 MG tablet Take 1 tablet (6.25 mg total) by mouth 2 (two) times daily with a meal. 10/17/18   Emokpae, Courage, MD  cefdinir (OMNICEF) 300 MG capsule Take 1 capsule (300 mg total) by mouth 2 (two) times daily for 7 days. 10/17/18 10/24/18  Roxan Hockey, MD  furosemide (LASIX) 20  MG tablet Take 1 tablet (20 mg total) by mouth daily. 10/17/18   Roxan Hockey, MD  metroNIDAZOLE (FLAGYL) 500 MG tablet Take 1 tablet (500 mg total) by mouth 3 (three) times daily for 7 days. 10/17/18 10/24/18  Roxan Hockey, MD  Multiple Vitamins-Minerals (PRESERVISION AREDS 2) CAPS Take 1 capsule by mouth 2 (two) times daily.     [provider]  omeprazole (PRILOSEC OTC) 20 MG tablet Take 1 tablet (20 mg total) by mouth daily. 10/17/18   Roxan Hockey, MD  ondansetron (ZOFRAN) 4 MG tablet Take 1  tablet (4 mg total) by mouth every 6 (six) hours as needed for nausea. 10/17/18   Roxan Hockey, MD  oxyCODONE-acetaminophen (PERCOCET) 7.5-325 MG tablet Take 1 tablet by mouth every 6 (six) hours as needed for moderate pain or severe pain. 10/17/18 10/17/19  Roxan Hockey, MD  Polyethyl Glycol-Propyl Glycol (SYSTANE ULTRA) 0.4-0.3 % SOLN Place 1-2 drops 2 (two) times daily as needed into both eyes (dry eyes).     [provider]  potassium chloride SA (K-DUR) 10 MEQ tablet Take 1 tablet (10 mEq total) by mouth daily. 10/17/18   Roxan Hockey, MD  QUEtiapine (SEROQUEL) 25 MG tablet Take 1 tablet (25 mg total) by mouth 2 (two) times daily. 10/17/18   Roxan Hockey, MD  sodium chloride (OCEAN) 0.65 % SOLN nasal spray Place 1 spray 2 (two) times daily as needed into both nostrils for congestion.     [provider]  traZODone (DESYREL) 150 MG tablet Take 1 tablet (150 mg total) by mouth at bedtime. 10/17/18   Roxan Hockey, MD    Family History Family History  Problem Relation Age of Onset   Cancer Sister    Asthma Sister    Heart failure Brother    Pulmonary embolism Brother    Cancer Brother    Colon cancer Neg Hx     Social History Social History   Tobacco Use   Smoking status: Former Smoker    Packs/day: 0.50    Years: 30.00    Pack years: 15.00    Types: Cigarettes    Quit date: 12/13/2004    Years since quitting: 13.8   Smokeless  tobacco: Never Used   Tobacco comment: former smoker  Substance Use Topics   Alcohol use: Yes    Alcohol/week: 0.0 standard drinks    Comment: history of drinking a 1/2 glass of wine in the evening   Drug use: No     Allergies   Ciprofloxacin, Flagyl [metronidazole], Iron, Papaya derivatives, Iodine, Penicillins, and Sulfa antibiotics   Review of Systems Review of Systems  All other systems reviewed and are negative.    Physical Exam Updated Vital Signs BP (!) 165/78    Pulse 81    Temp 97.7 F (36.5 C) (Oral)    Resp 18    Ht 5\' 7"  (1.702 m)    Wt 63.5 kg    SpO2 100%    BMI 21.93 kg/m   Physical Exam Vitals signs and nursing note reviewed.  HENT:     Head: Normocephalic.     Nose: Nose normal.  Eyes:     Pupils: Pupils are equal, round, and reactive to light.  Neck:     Musculoskeletal: Normal range of motion.  Cardiovascular:     Rate and Rhythm: Normal rate.     Pulses: Normal pulses.  Pulmonary:     Effort: Pulmonary effort is normal.  Abdominal:     General: Abdomen is flat.  Musculoskeletal:        General: Swelling present.     Comments: Bruising bilat legs   Skin:    General: Skin is warm.  Neurological:     General: No focal deficit present.     Mental Status: Kimberly [name] is alert.  Psychiatric:        Mood and Affect: Mood normal.      ED Treatments / Results  Labs (all labs ordered are listed, but only abnormal results are displayed) Labs Reviewed - No data to display  EKG  Radiology Dg Knee Complete 4 Views Left  Result Date: 10/20/2018 CLINICAL DATA:  Fall with pain and swelling EXAM: LEFT KNEE - COMPLETE 4+ VIEW COMPARISON:  10/09/2018 FINDINGS: No acute displaced fracture or malalignment. Mild mediolateral joint space degenerative change. Joint space calcifications. Vascular calcification. No significant knee effusion. IMPRESSION: 1. No acute osseous abnormality. 2. Chondrocalcinosis.  Mild degenerative changes. Electronically Signed    By: Donavan Foil M.D.   On: 10/20/2018 20:04   Dg Knee Complete 4 Views Right  Result Date: 10/20/2018 CLINICAL DATA:  Fall EXAM: RIGHT KNEE - COMPLETE 4+ VIEW COMPARISON:  None. FINDINGS: No acute displaced fracture or malalignment. Moderate arthritis of the lateral compartment. Trace knee effusion. Vascular calcifications. IMPRESSION: 1. No definite acute osseous abnormality.  Trace knee effusion. Electronically Signed   By: Donavan Foil M.D.   On: 10/20/2018 20:03    Procedures Procedures (including critical care time)  Medications Ordered in ED Medications  acetaminophen (TYLENOL) tablet 650 mg (650 mg Oral Refused 10/20/18 1931)  traMADol (ULTRAM) tablet 50 mg (50 mg Oral Given 10/20/18 2012)     Initial Impression / Assessment and Plan / ED Course  I have reviewed the triage vital signs and the nursing notes.  Pertinent labs & imaging results that were available during my care of the patient were reviewed by me and considered in my medical decision making (see chart for details).        MDM   Pt has not taken anything for pain.  Pt states Kimberly [name] can not take narcotics that they make her feel bad.  Pt offered tylenol and Kimberly [name] refused stating it does not work.  Pt has a history of ulcers.  I do not want to use nsaids.  Pt given tramadol.   Xrays reviewed and discussed with pt.  Rx for tramadol  Final Clinical Impressions(s) / ED Diagnoses   Final diagnoses:  Contusion of multiple sites of lower extremity, unspecified laterality, initial encounter    ED Discharge Orders         Ordered    traMADol (ULTRAM) 50 MG tablet  Every 6 hours PRN     10/20/18 2029        An After Visit Summary was printed and given to the patient.    Sidney Ace 10/20/18 2030    Milton Ferguson, MD 10/21/18 [phone]

## 2018-10-21 ENCOUNTER — Other Ambulatory Visit: Payer: Self-pay

## 2018-10-21 NOTE — Patient Outreach (Signed)
[place] [facility]) Care Management  10/21/2018  Kimberly [name] 1926/10/16 [id number]   Attempted outreach with Kimberly [name] in the absence of her assigned [facility] RNCM. Outreach unsuccessful. No answer at [phone]. No option to leave a voice message.  Per chart review, Kimberly [name] was recently discharged from [facility]. Discharge Dx included Acute GI bleed, Lower abdominal pain and Acute diverticulitis. She was tentatively scheduled for outreach on 10/20/18, but noted that she returned to the ED due to leg pain. Per ED provider note, she reported falling.  Kimberly [name] receives primary care at [facility]. Will update embedded [facility] [name] along with [facility] CM that will be providing coverage.   [place] [phone]

## 2018-10-22 ENCOUNTER — Other Ambulatory Visit: Payer: Self-pay | Admitting: *Deleted

## 2018-10-22 ENCOUNTER — Ambulatory Visit: Payer: Self-pay | Admitting: Licensed Clinical Social Worker

## 2018-10-22 ENCOUNTER — Telehealth: Payer: Self-pay | Admitting: Family Medicine

## 2018-10-22 ENCOUNTER — Encounter: Payer: Self-pay | Admitting: *Deleted

## 2018-10-22 DIAGNOSIS — Z7901 Long term (current) use of anticoagulants: Secondary | ICD-10-CM | POA: Diagnosis not present

## 2018-10-22 DIAGNOSIS — K219 Gastro-esophageal reflux disease without esophagitis: Secondary | ICD-10-CM

## 2018-10-22 DIAGNOSIS — L03116 Cellulitis of left lower limb: Secondary | ICD-10-CM | POA: Diagnosis not present

## 2018-10-22 DIAGNOSIS — Z79891 Long term (current) use of opiate analgesic: Secondary | ICD-10-CM | POA: Diagnosis not present

## 2018-10-22 DIAGNOSIS — F331 Major depressive disorder, recurrent, moderate: Secondary | ICD-10-CM

## 2018-10-22 DIAGNOSIS — K279 Peptic ulcer, site unspecified, unspecified as acute or chronic, without hemorrhage or perforation: Secondary | ICD-10-CM

## 2018-10-22 DIAGNOSIS — F3178 Bipolar disorder, in full remission, most recent episode mixed: Secondary | ICD-10-CM

## 2018-10-22 DIAGNOSIS — Z9181 History of falling: Secondary | ICD-10-CM | POA: Diagnosis not present

## 2018-10-22 DIAGNOSIS — I4821 Permanent atrial fibrillation: Secondary | ICD-10-CM

## 2018-10-22 DIAGNOSIS — I130 Hypertensive heart and chronic kidney disease with heart failure and stage 1 through stage 4 chronic kidney disease, or unspecified chronic kidney disease: Secondary | ICD-10-CM | POA: Diagnosis not present

## 2018-10-22 DIAGNOSIS — H409 Unspecified glaucoma: Secondary | ICD-10-CM | POA: Diagnosis not present

## 2018-10-22 DIAGNOSIS — M1990 Unspecified osteoarthritis, unspecified site: Secondary | ICD-10-CM | POA: Diagnosis not present

## 2018-10-22 DIAGNOSIS — K5791 Diverticulosis of intestine, part unspecified, without perforation or abscess with bleeding: Secondary | ICD-10-CM | POA: Diagnosis not present

## 2018-10-22 DIAGNOSIS — I5042 Chronic combined systolic (congestive) and diastolic (congestive) heart failure: Secondary | ICD-10-CM | POA: Diagnosis not present

## 2018-10-22 DIAGNOSIS — I10 Essential (primary) hypertension: Secondary | ICD-10-CM

## 2018-10-22 DIAGNOSIS — N183 Chronic kidney disease, stage 3 (moderate): Secondary | ICD-10-CM | POA: Diagnosis not present

## 2018-10-22 DIAGNOSIS — K259 Gastric ulcer, unspecified as acute or chronic, without hemorrhage or perforation: Secondary | ICD-10-CM | POA: Diagnosis not present

## 2018-10-22 DIAGNOSIS — K208 Other esophagitis: Secondary | ICD-10-CM | POA: Diagnosis not present

## 2018-10-22 NOTE — Patient Instructions (Signed)
Licensed Clinical Social Worker Visit Information  Goals we discussed today:  Goals Addressed            This Visit's Progress   . Client verbalizes she wants to talk with someone about anxiety and stress about managing her health conditions (patient stated) (pt-stated)       Current Barriers:  . Transportation . Social Isolation . Mental Health Challenges  Clinical Social Work Clinical Goal(s):  [name] [name] will talk with client in next 30 days to discuss anxiety and stress issues about managing her health conditions.   Interventions: . Talked with client about CCM program support with [name] and RNCM . Talked with client about current social work needs of client . Talked with client about social support network of client . Talked  with client about relaxation techniques to help client manage stress/anxiety symptoms.   Patient Self Care Activities:  . Takes medications as prescribed . Talks to PCP as needed about medical needs of client  . Attends medical appointments as scheduled  Plan: Client to call [name] to discuss social work needs of client Client to talk with RNCM about nursing needs of client [name] to call client in next 2 weeks to assess psychosocial needs of client  Initial goal documentation         Materials Provided: No  Follow Up Plan: [name] to call client in next 2 weeks to assess psychosocial needs of client  The patient verbalized understanding of instructions provided today and declined a print copy of patient instruction materials.   [name].Natallia Stellmach MSW, [name] Licensed Clinical Social Worker [facility] Care Management [phone]

## 2018-10-22 NOTE — Patient Outreach (Signed)
Transition of care call.  S: Pt was admitted 6/12-14 for GI bleed, diverticulitis. Completed TOC template. Pt has meds, taking as ordered. Home health has started services. She has a follow up with her primary care provider next week. She has food and transportation. Wears medical guardian alert.  Had a fall on 6/18 and went to the ED, evaluated and sent home with pain medication for her knee pain. She says her pain is minimal today and she doesn't like to take the narcotics.  She reports significant LE edema, hx of HF.  Patient was recently discharged from hospital and all medications have been reviewed.  Outpatient Encounter Medications as of 10/22/2018  Medication Sig Note  . acetaminophen (TYLENOL) 325 MG tablet Take 2 tablets (650 mg total) by mouth every 6 (six) hours as needed for mild pain, fever or headache (or Fever >/= 101).   [name] apixaban (ELIQUIS) 2.5 MG TABS tablet Take 1 tablet (2.5 mg total) by mouth 2 (two) times daily. Hold onto Wednesday, 10/20/2018   . carvedilol (COREG) 6.25 MG tablet Take 1 tablet (6.25 mg total) by mouth 2 (two) times daily with a meal.   . cefdinir (OMNICEF) 300 MG capsule Take 1 capsule (300 mg total) by mouth 2 (two) times daily for 7 days.   . furosemide (LASIX) 20 MG tablet Take 1 tablet (20 mg total) by mouth daily. 10/22/2018: Pt reports significant edema with very tight LE skin. Advised to take 40 mg of furosemide today, Saturday and Sunday.  . metroNIDAZOLE (FLAGYL) 500 MG tablet Take 1 tablet (500 mg total) by mouth 3 (three) times daily for 7 days.   . Multiple Vitamins-Minerals (PRESERVISION AREDS 2) CAPS Take 1 capsule by mouth 2 (two) times daily.    [name] omeprazole (PRILOSEC OTC) 20 MG tablet Take 1 tablet (20 mg total) by mouth daily.   Vladimir Faster Glycol-Propyl Glycol (SYSTANE ULTRA) 0.4-0.3 % SOLN Place 1-2 drops 2 (two) times daily as needed into both eyes (dry eyes).    . potassium chloride SA (K-DUR) 10 MEQ tablet Take 1 tablet (10 mEq total)  by mouth daily.   . QUEtiapine (SEROQUEL) 25 MG tablet Take 1 tablet (25 mg total) by mouth 2 (two) times daily.   . sodium chloride (OCEAN) 0.65 % SOLN nasal spray Place 1 spray 2 (two) times daily as needed into both nostrils for congestion.    . traZODone (DESYREL) 150 MG tablet Take 1 tablet (150 mg total) by mouth at bedtime.   . ondansetron (ZOFRAN) 4 MG tablet Take 1 tablet (4 mg total) by mouth every 6 (six) hours as needed for nausea. (Patient not taking: Reported on 10/22/2018)   . oxyCODONE-acetaminophen (PERCOCET) 7.5-325 MG tablet Take 1 tablet by mouth every 6 (six) hours as needed for moderate pain or severe pain. (Patient not taking: Reported on 10/22/2018) 10/22/2018: Pt does not like to take this. Has asked Police to come and dispose of this. She will take it as a last resort.  . traMADol (ULTRAM) 50 MG tablet Take 1 tablet (50 mg total) by mouth every 6 (six) hours as needed. (Patient not taking: Reported on 10/22/2018)    No facility-administered encounter medications on file as of 10/22/2018.    A: Post hospitalization for GI bleed caused by diverticulitis     Post ED visit for fall, no serious injury only bruising.     Peripheral edema  P: Safety precautions discussed regarding COVID19 and falls.      ADVISED  PT TO TAKE 40 MG OF FUROSEMIDE TODAY, Saturday AND  Sunday.      Emphasized early reporting to primary care of sxs to avoid this kind of escalating problem to avoid hospitalization.      Take all prescribed medications as ordered.       See Dr. Lajuana Ripple next week.       In office care manager and [name] to follow up.  Eulah Pont. Myrtie Neither, MSN, [facility] Gerontological Nurse Practitioner [facility] Care Management [phone]

## 2018-10-22 NOTE — Patient Outreach (Signed)
SDH reviewed.  Pt is independent at home and has community resources for transportation. Has emergency alert, has food. Is high risk for falls.  Kimberly [name]. Myrtie Neither, MSN, [facility] Gerontological Nurse Practitioner [facility] Care Management [phone]

## 2018-10-22 NOTE — Chronic Care Management (AMB) (Signed)
  Chronic Care Management    Clinical Social Work CCM Outreach Note  10/22/2018 Name: Kimberly [name] MRN: [id number] DOB: [birth year]  Kimberly [name] is a 85 y.o. year old female who is a primary care patient of [name], DO . The CCM team was consulted for assistance with assessment of psychosocial needs.   [name] reached out to [name] today by phone   Social Determinants of Health:Risk Depression; Risk for tobacco Exposure    Office Visit from 03/15/2018 in [place]  PHQ-9 Total Score  15     Goals    . Client verbalizes she wants to talk with someone about anxiety and stress about managing her health conditions (patient stated) (pt-stated)     Current Barriers:  . Transportation . Social Isolation . Mental Health Challenges  Clinical Social Work Clinical Goal(s):  [name] [name] will talk with client in next 30 days to discuss anxiety and stress issues about managing her health conditions.   Interventions: . Talked with client about CCM program support with [name] and RNCM . Talked with client about current social work needs of client . Talked with client about social support network of client . Talked  with client about relaxation techniques to help client manage stress/anxiety symptoms.   Patient Self Care Activities:  . Takes medications as prescribed . Talks to PCP as needed about medical needs of client  . Attends medical appointments as scheduled  Plan: Client to call [name] to discuss social work needs of client Client to talk with [facility] about nursing needs of client [name] to call client in next 2 weeks to assess psychosocial needs of client  Initial goal documentation      Client spoke of recent fall.  She was hospitalized and has been released from the hospital.  She is now back at home. Client said that social worker is coming to her home today to visit and assess client needs.She uses a walker to ambulate. She said she did well with  her meals. She said she has some difficulty sleeping. Client said she likes to relax by watching TV,listens to music, enjoys reading and writing). Client said she has swelling below her knees in both legs. She said it is hard to wear her shoes currently. She said she had her prescribed medications and is taking medications as prescribed.She said she does not want to go to skilled nursing facility and wants to remain at home with supports in place.   Follow Up Plan: [name] to call client in next 2 weeks to assess psychosocial needs of client  [name].[name] MSW, [name] Licensed Clinical Social Worker [facility] Care Management [phone]

## 2018-10-23 ENCOUNTER — Other Ambulatory Visit: Payer: Self-pay

## 2018-10-23 ENCOUNTER — Inpatient Hospital Stay (HOSPITAL_COMMUNITY)
Admission: EM | Admit: 2018-10-23 | Discharge: 2018-10-27 | DRG: 378 | Disposition: A | Discharge status: Home Health | Payer: Medicare Other | Attending: Internal Medicine | Admitting: Internal Medicine

## 2018-10-23 ENCOUNTER — Emergency Department (HOSPITAL_COMMUNITY): Payer: Medicare Other

## 2018-10-23 ENCOUNTER — Encounter (HOSPITAL_COMMUNITY): Payer: Self-pay | Admitting: Emergency Medicine

## 2018-10-23 DIAGNOSIS — L03116 Cellulitis of left lower limb: Secondary | ICD-10-CM | POA: Diagnosis not present

## 2018-10-23 DIAGNOSIS — Z880 Allergy status to penicillin: Secondary | ICD-10-CM

## 2018-10-23 DIAGNOSIS — Z888 Allergy status to other drugs, medicaments and biological substances status: Secondary | ICD-10-CM | POA: Diagnosis not present

## 2018-10-23 DIAGNOSIS — Z9049 Acquired absence of other specified parts of digestive tract: Secondary | ICD-10-CM | POA: Diagnosis not present

## 2018-10-23 DIAGNOSIS — Z881 Allergy status to other antibiotic agents status: Secondary | ICD-10-CM

## 2018-10-23 DIAGNOSIS — I5032 Chronic diastolic (congestive) heart failure: Secondary | ICD-10-CM | POA: Diagnosis present

## 2018-10-23 DIAGNOSIS — K5733 Diverticulitis of large intestine without perforation or abscess with bleeding: Principal | ICD-10-CM | POA: Diagnosis present

## 2018-10-23 DIAGNOSIS — I4821 Permanent atrial fibrillation: Secondary | ICD-10-CM | POA: Diagnosis not present

## 2018-10-23 DIAGNOSIS — K5730 Diverticulosis of large intestine without perforation or abscess without bleeding: Secondary | ICD-10-CM | POA: Diagnosis not present

## 2018-10-23 DIAGNOSIS — D62 Acute posthemorrhagic anemia: Secondary | ICD-10-CM | POA: Diagnosis present

## 2018-10-23 DIAGNOSIS — R5381 Other malaise: Secondary | ICD-10-CM | POA: Diagnosis not present

## 2018-10-23 DIAGNOSIS — I10 Essential (primary) hypertension: Secondary | ICD-10-CM | POA: Diagnosis present

## 2018-10-23 DIAGNOSIS — Z66 Do not resuscitate: Secondary | ICD-10-CM | POA: Diagnosis present

## 2018-10-23 DIAGNOSIS — Z87891 Personal history of nicotine dependence: Secondary | ICD-10-CM

## 2018-10-23 DIAGNOSIS — Z8249 Family history of ischemic heart disease and other diseases of the circulatory system: Secondary | ICD-10-CM

## 2018-10-23 DIAGNOSIS — K625 Hemorrhage of anus and rectum: Secondary | ICD-10-CM

## 2018-10-23 DIAGNOSIS — M1990 Unspecified osteoarthritis, unspecified site: Secondary | ICD-10-CM | POA: Diagnosis not present

## 2018-10-23 DIAGNOSIS — Z809 Family history of malignant neoplasm, unspecified: Secondary | ICD-10-CM

## 2018-10-23 DIAGNOSIS — I872 Venous insufficiency (chronic) (peripheral): Secondary | ICD-10-CM | POA: Diagnosis not present

## 2018-10-23 DIAGNOSIS — K208 Other esophagitis: Secondary | ICD-10-CM | POA: Diagnosis not present

## 2018-10-23 DIAGNOSIS — I130 Hypertensive heart and chronic kidney disease with heart failure and stage 1 through stage 4 chronic kidney disease, or unspecified chronic kidney disease: Secondary | ICD-10-CM | POA: Diagnosis present

## 2018-10-23 DIAGNOSIS — H409 Unspecified glaucoma: Secondary | ICD-10-CM | POA: Diagnosis not present

## 2018-10-23 DIAGNOSIS — Z90710 Acquired absence of both cervix and uterus: Secondary | ICD-10-CM

## 2018-10-23 DIAGNOSIS — Z950 Presence of cardiac pacemaker: Secondary | ICD-10-CM | POA: Diagnosis not present

## 2018-10-23 DIAGNOSIS — Z7901 Long term (current) use of anticoagulants: Secondary | ICD-10-CM | POA: Diagnosis not present

## 2018-10-23 DIAGNOSIS — K5792 Diverticulitis of intestine, part unspecified, without perforation or abscess without bleeding: Secondary | ICD-10-CM | POA: Diagnosis present

## 2018-10-23 DIAGNOSIS — K259 Gastric ulcer, unspecified as acute or chronic, without hemorrhage or perforation: Secondary | ICD-10-CM | POA: Diagnosis not present

## 2018-10-23 DIAGNOSIS — Z1159 Encounter for screening for other viral diseases: Secondary | ICD-10-CM | POA: Diagnosis not present

## 2018-10-23 DIAGNOSIS — Z825 Family history of asthma and other chronic lower respiratory diseases: Secondary | ICD-10-CM | POA: Diagnosis not present

## 2018-10-23 DIAGNOSIS — Z882 Allergy status to sulfonamides status: Secondary | ICD-10-CM | POA: Diagnosis not present

## 2018-10-23 DIAGNOSIS — Z79899 Other long term (current) drug therapy: Secondary | ICD-10-CM

## 2018-10-23 DIAGNOSIS — Z03818 Encounter for observation for suspected exposure to other biological agents ruled out: Secondary | ICD-10-CM | POA: Diagnosis not present

## 2018-10-23 DIAGNOSIS — D649 Anemia, unspecified: Secondary | ICD-10-CM | POA: Diagnosis present

## 2018-10-23 DIAGNOSIS — K219 Gastro-esophageal reflux disease without esophagitis: Secondary | ICD-10-CM | POA: Diagnosis present

## 2018-10-23 DIAGNOSIS — Z79891 Long term (current) use of opiate analgesic: Secondary | ICD-10-CM | POA: Diagnosis not present

## 2018-10-23 DIAGNOSIS — K5791 Diverticulosis of intestine, part unspecified, without perforation or abscess with bleeding: Secondary | ICD-10-CM | POA: Diagnosis not present

## 2018-10-23 DIAGNOSIS — Z8711 Personal history of peptic ulcer disease: Secondary | ICD-10-CM | POA: Diagnosis not present

## 2018-10-23 DIAGNOSIS — I878 Other specified disorders of veins: Secondary | ICD-10-CM | POA: Diagnosis present

## 2018-10-23 DIAGNOSIS — I428 Other cardiomyopathies: Secondary | ICD-10-CM | POA: Diagnosis present

## 2018-10-23 DIAGNOSIS — F419 Anxiety disorder, unspecified: Secondary | ICD-10-CM | POA: Diagnosis present

## 2018-10-23 DIAGNOSIS — I5042 Chronic combined systolic (congestive) and diastolic (congestive) heart failure: Secondary | ICD-10-CM | POA: Diagnosis present

## 2018-10-23 DIAGNOSIS — K922 Gastrointestinal hemorrhage, unspecified: Secondary | ICD-10-CM

## 2018-10-23 DIAGNOSIS — N183 Chronic kidney disease, stage 3 unspecified: Secondary | ICD-10-CM | POA: Diagnosis present

## 2018-10-23 DIAGNOSIS — R58 Hemorrhage, not elsewhere classified: Secondary | ICD-10-CM | POA: Diagnosis not present

## 2018-10-23 DIAGNOSIS — Z9181 History of falling: Secondary | ICD-10-CM | POA: Diagnosis not present

## 2018-10-23 DIAGNOSIS — G2581 Restless legs syndrome: Secondary | ICD-10-CM | POA: Diagnosis present

## 2018-10-23 DIAGNOSIS — F319 Bipolar disorder, unspecified: Secondary | ICD-10-CM | POA: Diagnosis present

## 2018-10-23 LAB — CBC
HCT: 30.1 % — ABNORMAL LOW (ref 36.0–46.0)
Hemoglobin: 9.8 g/dL — ABNORMAL LOW (ref 12.0–15.0)
MCH: 31.3 pg (ref 26.0–34.0)
MCHC: 32.6 g/dL (ref 30.0–36.0)
MCV: 96.2 fL (ref 80.0–100.0)
Platelets: 204 10*3/uL (ref 150–400)
RBC: 3.13 MIL/uL — ABNORMAL LOW (ref 3.87–5.11)
RDW: 20.8 % — ABNORMAL HIGH (ref 11.5–15.5)
WBC: 7.8 10*3/uL (ref 4.0–10.5)
nRBC: 0 % (ref 0.0–0.2)

## 2018-10-23 LAB — COMPREHENSIVE METABOLIC PANEL
ALT: 14 U/L (ref 0–44)
AST: 26 U/L (ref 15–41)
Albumin: 3.5 g/dL (ref 3.5–5.0)
Alkaline Phosphatase: 51 U/L (ref 38–126)
Anion gap: 11 (ref 5–15)
BUN: 13 mg/dL (ref 8–23)
CO2: 24 mmol/L (ref 22–32)
Calcium: 8.1 mg/dL — ABNORMAL LOW (ref 8.9–10.3)
Chloride: 102 mmol/L (ref 98–111)
Creatinine, Ser: 1.48 mg/dL — ABNORMAL HIGH (ref 0.44–1.00)
GFR calc Af Amer: 36 mL/min — ABNORMAL LOW (ref >60)
GFR calc non Af Amer: 31 mL/min — ABNORMAL LOW (ref >60)
Glucose, Bld: 94 mg/dL (ref 70–99)
Potassium: 3.6 mmol/L (ref 3.5–5.1)
Sodium: 137 mmol/L (ref 135–145)
Total Bilirubin: 0.4 mg/dL (ref 0.3–1.2)
Total Protein: 6.2 g/dL — ABNORMAL LOW (ref 6.5–8.1)

## 2018-10-23 LAB — CBC WITH DIFFERENTIAL/PLATELET
Abs Immature Granulocytes: 0.04 10*3/uL (ref 0.00–0.07)
Basophils Absolute: 0 10*3/uL (ref 0.0–0.1)
Basophils Relative: 0 %
Eosinophils Absolute: 0.1 10*3/uL (ref 0.0–0.5)
Eosinophils Relative: 2 %
HCT: 30.5 % — ABNORMAL LOW (ref 36.0–46.0)
Hemoglobin: 9.5 g/dL — ABNORMAL LOW (ref 12.0–15.0)
Immature Granulocytes: 1 %
Lymphocytes Relative: 16 %
Lymphs Abs: 1.1 10*3/uL (ref 0.7–4.0)
MCH: 30.4 pg (ref 26.0–34.0)
MCHC: 31.1 g/dL (ref 30.0–36.0)
MCV: 97.8 fL (ref 80.0–100.0)
Monocytes Absolute: 0.6 10*3/uL (ref 0.1–1.0)
Monocytes Relative: 10 %
Neutro Abs: 4.7 10*3/uL (ref 1.7–7.7)
Neutrophils Relative %: 71 %
Platelets: 199 10*3/uL (ref 150–400)
RBC: 3.12 MIL/uL — ABNORMAL LOW (ref 3.87–5.11)
RDW: 20.6 % — ABNORMAL HIGH (ref 11.5–15.5)
WBC: 6.6 10*3/uL (ref 4.0–10.5)
nRBC: 0 % (ref 0.0–0.2)

## 2018-10-23 LAB — TYPE AND SCREEN
ABO/RH(D): A NEG
Antibody Screen: NEGATIVE

## 2018-10-23 LAB — LIPASE, BLOOD: Lipase: 45 U/L (ref 11–51)

## 2018-10-23 LAB — SARS CORONAVIRUS 2 BY RT PCR (HOSPITAL ORDER, PERFORMED IN ~~LOC~~ HOSPITAL LAB): SARS Coronavirus 2: NEGATIVE

## 2018-10-23 LAB — POC OCCULT BLOOD, ED: Fecal Occult Bld: POSITIVE — AB

## 2018-10-23 MED ORDER — SODIUM CHLORIDE 0.9 % IV SOLN: INTRAVENOUS | Status: DC

## 2018-10-23 MED ORDER — ONDANSETRON HCL 4 MG/2ML IJ SOLN: 4.0000 mg | Freq: Four times a day (QID) | INTRAMUSCULAR | Status: DC | PRN

## 2018-10-23 MED ORDER — SODIUM CHLORIDE 0.9 % IV SOLN
INTRAVENOUS | Status: AC
  Administered 2018-10-23: 20:00:00 via INTRAVENOUS

## 2018-10-23 MED ORDER — ACETAMINOPHEN 325 MG PO TABS
650.0000 mg | ORAL_TABLET | Freq: Four times a day (QID) | ORAL | Status: DC | PRN
  Administered 2018-10-27: 21:00:00 650 mg via ORAL
  Filled 2018-10-23: qty 2

## 2018-10-23 MED ORDER — METRONIDAZOLE IN NACL 5-0.79 MG/ML-% IV SOLN
500.0000 mg | Freq: Three times a day (TID) | INTRAVENOUS | Status: DC
  Administered 2018-10-23 – 2018-10-24 (×2): 500 mg via INTRAVENOUS
  Filled 2018-10-23 (×2): qty 100

## 2018-10-23 MED ORDER — ACETAMINOPHEN 650 MG RE SUPP: 650.0000 mg | Freq: Four times a day (QID) | RECTAL | Status: DC | PRN

## 2018-10-23 NOTE — ED Triage Notes (Signed)
Pt reports recurrence of rectal bleeding starting at noon today. Was recently hospitalized for same.

## 2018-10-23 NOTE — H&P (Addendum)
TRH H&P    Patient Demographics:    Kimberly [name], is a 85 y.o. female  MRN: [id number]  DOB - 05-19-1926  Admit Date - 10/23/2018  Referring MD/NP/PA: Aundria Rud  Outpatient Primary MD for the patient is [name], DO  Patient coming from:  home  Chief complaint- rectal bleeding   HPI:    Kimberly [name]  is a 85 y.o. female,w bipolar disorder, hypertension, ckd stage 3, CHF (EF 60-65% ), Erosive esophagitis, PUD, h/o diverticulitis w recent admission 6/9 for rectal bleeding, apparently c/o rectal bleeding over the past 2 days.  Pt states that she has mild right lower abdominal discomfort which is chronic.  Pt admits to drinking a glass of wine per day as well as using ibuprofen x2 in the past several days.  She has been taking her Eliquis.  Pt denies other aspirin or Nsaid use.  Pt denies fever, chills, cough, cp, palp, sob, n/v, diarrhea, black stool.  Pt presented to ED due to rectal bleeding.   In ED  CT scan abd/ pelvis IMPRESSION: 1. Extensive left colonic diverticulosis with mild diverticulitis at the level of the proximal descending colon and mid sigmoid colon. No evidence of rupture or abscess formation. 2. Punctate right nephrolithiasis. 3. Enlarged heart. 4. Calcific atherosclerotic disease of the coronary arteries and Aorta.  Wbc 6.6, Hgb 9.5, Plt 199 (prior Hgb 10.5)  Na 137, K 3.6, Bun 13, Creatinine 1.48 Alb 3.5 Ast 26, Alt 14, Alk phos 51, T. Bili 0.4 Lipase 45  Covid - 19 negative  Pt will be admitted for rectal bleeding and diverticulitis    Review of systems:    In addition to the HPI above,  No Fever-chills, No Headache, No changes with Vision or hearing, No problems swallowing food or Liquids, No Chest pain, Cough or Shortness of Breath, No Nausea or Vomiting, bowel movements are regular, No Blood in stool No dysuria, No new skin rashes or bruises, No new  joints pains-aches,  No new weakness, tingling, numbness in any extremity, No recent weight gain or loss, No polyuria, polydypsia or polyphagia, No significant Mental Stressors.  Recent fall in bathtub, but didn't hit head  All other systems reviewed and are negative.    Past History of the following :    Past Medical History:  Diagnosis Date  . Acute blood loss anemia 10/11/2012  . Acute diverticulitis 08/24/2013  . Acute on chronic combined systolic and diastolic CHF, NYHA class 4 ([place]) 11/15/2013  . Antral ulcer 10/11/2012  . Arrhythmia    atrial fibb  . Atrial fibrillation ([place])   . Cardiomyopathy, nonischemic ([place])   . Chronic anticoagulation 10/12/2012  . CKD (chronic kidney disease) stage 3, GFR 30-59 ml/min (HCC) 10/12/2012  . Depression   . Erosive esophagitis 10/11/2012  . Fibromyalgia   . Glaucoma   . H/O echocardiogram 2007   EF 40-45%,         . Hypertension   . Osteoarthritis   . Pacemaker    Last saw cards 07/2013  .  Scoliosis       Past Surgical History:  Procedure Laterality Date  . ABDOMINAL HYSTERECTOMY    . APPENDECTOMY    . BACK SURGERY    . BIOPSY  03/03/2017   Procedure: BIOPSY;  Surgeon: Danie Binder, MD;  [facility];  Service: Endoscopy;;  gastric  . BREAST SURGERY    . CARDIAC CATHETERIZATION  12/08/2005   LAD AND LEFT MAIN WITH NO HIGH-GRADE STENOSIS. MILD DISEASE IN THE CX AND LAD SYSTEM. SEVERE LV DYSFUNCTION WITH DILATION OF THE LV. EF 15-20%. LV END-DIASTOLIC PRESSURE IS 90. +1 MR.  . CHOLECYSTECTOMY    . COLONOSCOPY N/A 03/03/2017   Procedure: COLONOSCOPY;  Surgeon: Danie Binder, MD;  [facility];  Service: Endoscopy;  Laterality: N/A;  . CYSTOSCOPY N/A 02/24/2013   Procedure: CYSTOSCOPY WITH URETHRAL DILITATION;  Surgeon: Marissa Nestle, MD;  Location: [facility];  Service: Urology;  Laterality: N/A;  . DOPPLER ECHOCARDIOGRAPHY N/A 05/30/2010   LV SIZE IS NORMAL. LV SYSTOLIC FUNCTION IS LOW NORMAL. EF=50-55%. MILD  INFERIOR HYPOKINESIS.MILD TO MODERATE POSTERIOR WALL HYPOKINESIS.PACEMAKER LEAD IN THE RV. LA IS MILDLY DILATED. RA IS MODERATE TO SEVERLY DILATED. PACEMAKER LEAD IN THE RA. MILD CALCICICATION OF THE MV APPARATUS. MODERATE MR. MILD TO MODERATE TR. MILD PHTN.AV MILDLY SCLEROTIC.  [name] ESOPHAGOGASTRODUODENOSCOPY N/A 10/13/2012   Dr. Gala Romney: severe ulcerative reflux esophagitis, question of Barrett's but negative path, single deep prepyloric antral ulcer, negative H.pylori  . ESOPHAGOGASTRODUODENOSCOPY N/A 03/03/2017   Procedure: ESOPHAGOGASTRODUODENOSCOPY (EGD);  Surgeon: Danie Binder, MD;  [facility];  Service: Endoscopy;  Laterality: N/A;  . HERNIA REPAIR     right inguinal hernia and umbilical  . LOWETR EXT VENOUS Bilateral 11-08-10   R & L- NO EVIDENCE OF THROMBUS OR THROMBOPHLEBITIS. THERE IS MILD AMOUNT OF SUBCUTANEOUS EDEMA NOTED WITHIN THE LEFT CALF AND ANKLE. R & L GSV AND SSV- NO VENOUS INSUFF NOTED.  [name] NECK SURGERY    . NUCLEAR STRESS TEST N/A 02/13/2009   NORMAL PATTERN OF PERFUSION IN ALL REGIONS. POST STRESS VENTICULAR SIZE IS NORMAL. POST  STESS EF 85%.  NORMAL MYOCARDIAL PERFUSION STUDY.  [name] PACEMAKER INSERTION    . POLYPECTOMY  03/03/2017   Procedure: POLYPECTOMY;  Surgeon: Danie Binder, MD;  [facility];  Service: Endoscopy;;  colon  . TONSILLECTOMY    . YAG LASER APPLICATION Bilateral 1/61/0960   Procedure: YAG LASER APPLICATION;  Surgeon: Williams Che, MD;  Location: [facility];  Service: Ophthalmology;  Laterality: Bilateral;      Social History:      Social History   Tobacco Use  . Smoking status: Former Smoker    Packs/day: 0.50    Years: 30.00    Pack years: 15.00    Types: Cigarettes    Quit date: 12/13/2004    Years since quitting: 13.8  . Smokeless tobacco: Never Used  . Tobacco comment: former smoker  Substance Use Topics  . Alcohol use: Yes    Alcohol/week: 0.0 standard drinks    Comment: history of drinking a 1/2 glass of wine  in the evening       Family History :     Family History  Problem Relation Age of Onset  . Cancer Sister   . Asthma Sister   . Heart failure Brother   . Pulmonary embolism Brother   . Cancer Brother   . Colon cancer Neg Hx        Home  Medications:   Prior to Admission medications   Medication Sig Start Date End Date Taking? Authorizing Provider  acetaminophen (TYLENOL) 325 MG tablet Take 2 tablets (650 mg total) by mouth every 6 (six) hours as needed for mild pain, fever or headache (or Fever >/= 101). 10/17/18  Yes Emokpae, Courage, MD  apixaban (ELIQUIS) 2.5 MG TABS tablet Take 1 tablet (2.5 mg total) by mouth 2 (two) times daily. Hold onto Wednesday, 10/20/2018 10/20/18  Yes Emokpae, Courage, MD  carvedilol (COREG) 6.25 MG tablet Take 1 tablet (6.25 mg total) by mouth 2 (two) times daily with a meal. 10/17/18  Yes Emokpae, Courage, MD  cefdinir (OMNICEF) 300 MG capsule Take 1 capsule (300 mg total) by mouth 2 (two) times daily for 7 days. 10/17/18 10/24/18 Yes Emokpae, Courage, MD  furosemide (LASIX) 20 MG tablet Take 1 tablet (20 mg total) by mouth daily. 10/17/18  Yes Emokpae, Courage, MD  metroNIDAZOLE (FLAGYL) 500 MG tablet Take 1 tablet (500 mg total) by mouth 3 (three) times daily for 7 days. 10/17/18 10/24/18 Yes Emokpae, Courage, MD  Multiple Vitamins-Minerals (PRESERVISION AREDS 2) CAPS Take 1 capsule by mouth 2 (two) times daily.    Yes [provider]  omeprazole (PRILOSEC OTC) 20 MG tablet Take 1 tablet (20 mg total) by mouth daily. 10/17/18  Yes Emokpae, Courage, MD  ondansetron (ZOFRAN) 4 MG tablet Take 1 tablet (4 mg total) by mouth every 6 (six) hours as needed for nausea. 10/17/18  Yes Emokpae, Courage, MD  Polyethyl Glycol-Propyl Glycol (SYSTANE ULTRA) 0.4-0.3 % SOLN Place 1-2 drops 2 (two) times daily as needed into both eyes (dry eyes).    Yes [provider]  potassium chloride SA (K-DUR) 10 MEQ tablet Take 1 tablet (10 mEq total) by mouth daily. 10/17/18   Yes Emokpae, Courage, MD  QUEtiapine (SEROQUEL) 25 MG tablet Take 1 tablet (25 mg total) by mouth 2 (two) times daily. 10/17/18  Yes Emokpae, Courage, MD  sodium chloride (OCEAN) 0.65 % SOLN nasal spray Place 1 spray 2 (two) times daily as needed into both nostrils for congestion.    Yes [provider]  traZODone (DESYREL) 150 MG tablet Take 1 tablet (150 mg total) by mouth at bedtime. 10/17/18  Yes Emokpae, Courage, MD  oxyCODONE-acetaminophen (PERCOCET) 7.5-325 MG tablet Take 1 tablet by mouth every 6 (six) hours as needed for moderate pain or severe pain. Patient not taking: Reported on 10/22/2018 10/17/18 10/17/19  Roxan Hockey, MD  traMADol (ULTRAM) 50 MG tablet Take 1 tablet (50 mg total) by mouth every 6 (six) hours as needed. Patient not taking: Reported on 10/22/2018 10/20/18 10/20/19  Fransico Meadow, PA-C     Allergies:     Allergies  Allergen Reactions  . Ciprofloxacin Other (See Comments)    Possibly caused diarrhea November 2018  . Flagyl [Metronidazole] Other (See Comments)    Possibly caused diarrhea November 2018  . Iron Swelling    Ferrous Sulfate - tongue swelling   . Papaya Derivatives Hives  . Iodine Rash and Other (See Comments)    REACTION:If injected,  Rash/irritated skin reaction "welts"  . Penicillins Hives    DID THE REACTION INVOLVE: Swelling of the face/tongue/throat, SOB, or low BP? Unknown Sudden or severe rash/hives, skin peeling, or the inside of the mouth or nose? Yes Did it require medical treatment? Yes When did it last happen?52 or 85 years old If all above answers are "NO", may proceed with cephalosporin use.  . Sulfa Antibiotics Rash  Physical Exam:   Vitals  Height _0  (1.702 m), weight 63.5 kg.  1.  General: axox3  2. Psychiatric: euthymic  3. Neurologic: cn2-12 intact, reflexes 2+ symmetric, diffuse with no clonus, motor 5/5 in all 4 ext  4. HEENMT:  Anicteric, pupils 1.32m symmetric, direct, consensual, near  intact Neck: no jvd  5. Respiratory : CTAB  6. Cardiovascular : rrr s1, s2,   7. Gastrointestinal:  Abd: soft, nt, nd, +bs  8. Skin:  Ext: no c/c/e,  Slight bruise on the left forearm  9.Musculoskeletal:  + synovitis mcp joints, ? Slight ulnar deviation Good ROM  No adenopathy   Data Review:    CBC Recent Labs  Lab 10/17/18 0854 10/23/18 1813  WBC 6.4 6.6  HGB 10.5* 9.5*  HCT 32.8* 30.5*  PLT 159 199  MCV 99.1 97.8  MCH 31.7 30.4  MCHC 32.0 31.1  RDW 20.9* 20.6*  LYMPHSABS  --  1.1  MONOABS  --  0.6  EOSABS  --  0.1  BASOSABS  --  0.0   ------------------------------------------------------------------------------------------------------------------  Results for orders placed or performed during the hospital encounter of 10/23/18 (from the past 48 hour(s))  Comprehensive metabolic panel     Status: Abnormal   Collection Time: 10/23/18  6:13 PM  Result Value Ref Range   Sodium 137 135 - 145 mmol/L   Potassium 3.6 3.5 - 5.1 mmol/L   Chloride 102 98 - 111 mmol/L   CO2 24 22 - 32 mmol/L   Glucose, Bld 94 70 - 99 mg/dL   BUN 13 8 - 23 mg/dL   Creatinine, Ser 1.48 (H) 0.44 - 1.00 mg/dL   Calcium 8.1 (L) 8.9 - 10.3 mg/dL   Total Protein 6.2 (L) 6.5 - 8.1 g/dL   Albumin 3.5 3.5 - 5.0 g/dL   AST 26 15 - 41 U/L   ALT 14 0 - 44 U/L   Alkaline Phosphatase 51 38 - 126 U/L   Total Bilirubin 0.4 0.3 - 1.2 mg/dL   GFR calc non Af Amer 31 (L) >60 mL/min   GFR calc Af Amer 36 (L) >60 mL/min   Anion gap 11 5 - 15    Comment: [facility] [address], [place] [id number] Lipase, blood     Status: None   Collection Time: 10/23/18  6:13 PM  Result Value Ref Range   Lipase 45 11 - 51 U/L    Comment: [facility] at [facility] [address] [place]  [id number] CBC with Differential/Platelet     Status: Abnormal   Collection Time: 10/23/18  6:13 PM  Result Value Ref Range   WBC 6.6 4.0 - 10.5 K/uL   RBC 3.12 (L) 3.87 - 5.11 MIL/uL    Hemoglobin 9.5 (L) 12.0 - 15.0 g/dL   HCT 30.5 (L) 36.0 - 46.0 %   MCV 97.8 80.0 - 100.0 fL   MCH 30.4 26.0 - 34.0 pg   MCHC 31.1 30.0 - 36.0 g/dL   RDW 20.6 (H) 11.5 - 15.5 %   Platelets 199 150 - 400 K/uL   nRBC 0.0 0.0 - 0.2 %   Neutrophils Relative % 71 %   Neutro Abs 4.7 1.7 - 7.7 K/uL   Lymphocytes Relative 16 %   Lymphs Abs 1.1 0.7 - 4.0 K/uL   Monocytes Relative 10 %   Monocytes Absolute 0.6 0.1 - 1.0 K/uL   Eosinophils Relative 2 %   Eosinophils Absolute 0.1 0.0 -  0.5 K/uL   Basophils Relative 0 %   Basophils Absolute 0.0 0.0 - 0.1 K/uL   Immature Granulocytes 1 %   Abs Immature Granulocytes 0.04 0.00 - 0.07 K/uL    Comment: Performed at [facility], [address], [place], [place] [address]  Type and screen [facility]     Status: None   Collection Time: 10/23/18  6:14 PM  Result Value Ref Range   ABO/RH(D) A NEG    Antibody Screen NEG    Sample Expiration      10/26/2018,2359 Performed at [facility], [address], [place], [place] [address]   POC occult blood, ED RN will collect     Status: Abnormal   Collection Time: 10/23/18  7:45 PM  Result Value Ref Range   Fecal Occult Bld POSITIVE (A) NEGATIVE    Chemistries  Recent Labs  Lab 10/17/18 0854 10/23/18 1813  NA 135 137  K 4.5 3.6  CL 106 102  CO2 18* 24  GLUCOSE 100* 94  BUN 9 13  CREATININE 1.09* 1.48*  CALCIUM 8.5* 8.1*  AST  --  26  ALT  --  14  ALKPHOS  --  51  BILITOT  --  0.4   ------------------------------------------------------------------------------------------------------------------  ------------------------------------------------------------------------------------------------------------------ GFR: Estimated Creatinine Clearance: 25.1 mL/min (A) (by C-G formula based on SCr of 1.48 mg/dL (H)). Liver Function Tests: Recent Labs  Lab 10/23/18 1813  AST 26  ALT 14  ALKPHOS 51  BILITOT 0.4  PROT 6.2*  ALBUMIN 3.5   Recent Labs  Lab 10/23/18 1813  LIPASE 45    No results for input(s): AMMONIA in the last 168 hours. Coagulation Profile: No results for input(s): INR, PROTIME in the last 168 hours. Cardiac Enzymes: No results for input(s): CKTOTAL, CKMB, CKMBINDEX, TROPONINI in the last 168 hours. BNP (last 3 results) No results for input(s): PROBNP in the last 8760 hours. HbA1C: No results for input(s): HGBA1C in the last 72 hours. CBG: Recent Labs  Lab 10/17/18 0752 10/18/18 0544  GLUCAP 92 81   Lipid Profile: No results for input(s): CHOL, HDL, LDLCALC, TRIG, CHOLHDL, LDLDIRECT in the last 72 hours. Thyroid Function Tests: No results for input(s): TSH, T4TOTAL, FREET4, T3FREE, THYROIDAB in the last 72 hours. Anemia Panel: No results for input(s): VITAMINB12, FOLATE, FERRITIN, TIBC, IRON, RETICCTPCT in the last 72 hours.  --------------------------------------------------------------------------------------------------------------- Urine analysis:    Component Value Date/Time   COLORURINE YELLOW 04/19/2018 0523   APPEARANCEUR CLEAR 04/19/2018 0523   LABSPEC 1.013 04/19/2018 0523   PHURINE 7.0 04/19/2018 0523   GLUCOSEU NEGATIVE 04/19/2018 0523   HGBUR NEGATIVE 04/19/2018 0523   BILIRUBINUR NEGATIVE 04/19/2018 0523   BILIRUBINUR neg 04/25/2014 0855   KETONESUR NEGATIVE 04/19/2018 0523   PROTEINUR NEGATIVE 04/19/2018 0523   UROBILINOGEN 0.2 05/26/2014 1415   NITRITE NEGATIVE 04/19/2018 0523   LEUKOCYTESUR TRACE (A) 04/19/2018 0523      Imaging Results:    Ct Abdomen Pelvis Wo Contrast  Result Date: 10/23/2018 CLINICAL DATA:  Recurrent rectal bleeding. EXAM: CT ABDOMEN AND PELVIS WITHOUT CONTRAST TECHNIQUE: Multidetector CT imaging of the abdomen and pelvis was performed following the standard protocol without IV contrast. COMPARISON:  10/12/2018 FINDINGS: Lower chest: Enlarged heart. Calcific atherosclerotic disease of the coronary arteries and aorta. Partially visualized cardiac pacemaker leads. Hiatal hernia. Hepatobiliary:  Hepatic cysts noted. Status post cholecystectomy. No biliary dilatation. Pancreas: Unremarkable. No pancreatic ductal dilatation or surrounding inflammatory changes. Spleen: Normal in size without focal abnormality. Adrenals/Urinary Tract: Adrenal glands are  unremarkable. Kidneys are without focal lesion, or hydronephrosis. Punctate right nephrolithiasis, stable. Bladder is unremarkable. Stomach/Bowel: Stomach is within normal limits. No evidence of appendicitis. No evidence of small bowel wall thickening, distention, or inflammatory changes. Extensive left colonic diverticulosis. Mild diverticulitis at the level of the proximal descending colon and mid sigmoid colon. No evidence of rupture or abscess formation. Vascular/Lymphatic: Aortic atherosclerosis. No enlarged abdominal or pelvic lymph nodes. Reproductive: Status post hysterectomy. No adnexal masses. Other: No abdominopelvic ascites. Musculoskeletal: Osteopenia, scoliosis and diffuse spondylosis of the spine. IMPRESSION: 1. Extensive left colonic diverticulosis with mild diverticulitis at the level of the proximal descending colon and mid sigmoid colon. No evidence of rupture or abscess formation. 2. Punctate right nephrolithiasis. 3. Enlarged heart. 4. Calcific atherosclerotic disease of the coronary arteries and aorta. Electronically Signed   By: Fidela Salisbury M.D.   On: 10/23/2018 20:06   ekg paced at 80   Assessment & Plan:    Principal Problem:   Rectal bleeding Active Problems:   Permanent atrial fibrillation   Biventricular cardiac pacemaker - [employer] Consulta   Essential hypertension   CKD (chronic kidney disease) stage 3, GFR 30-59 ml/min (HCC)   Acute diverticulitis   Anemia  Rectal bleeding/ Anemia Clear liquids til MN then NPO except for medications Hold Eliquis Pt educated to avoid NSAIDS, if taking Eliquis, may need to stop etoh as well.  GI consult requested in computer Cbc later this evening and then cbc in am   Acute Diverticulitis Start Rocephin 1gm iv qday Start Flagyl 533m iv tid  CKD stage 3 Slightly elevated creatinine from baseline Hydrate gently with ns at 742m hr  x6hours Check cmp in am  Permanent Afib (chadsvasc2=5) / hx of Pacer, chronic diastolic CHF (EF 6074%Hold Eliquis Cont cavedilol 6.2538mo bid Cont lasix 20m20m qday  Gerd/ h/o esphagitis Cont PPI  Bipolar do Cont Seroquel 25mg53mqhs  Arthritis ? Rheumatoid Cont Tramadol prn   DVT Prophylaxis-   SCDs   AM Labs Ordered, also please review Full Orders  Family Communication: Admission, patients condition and plan of care including tests being ordered have been discussed with the patient  who indicate understanding and agree with the plan and Code Status.  Code Status:  DNR per patient  Admission status: Observation: Based on patients clinical presentation and evaluation of above clinical data, I have made determination that patient meets observation criteria at this time.  Time spent in minutes : 70   JamesJani Gravelon 10/23/2018 at 9:34 PM

## 2018-10-24 ENCOUNTER — Inpatient Hospital Stay (HOSPITAL_COMMUNITY): Admission: RE | Admit: 2018-10-24 | Payer: Medicare Other | Source: Ambulatory Visit

## 2018-10-24 DIAGNOSIS — I4821 Permanent atrial fibrillation: Secondary | ICD-10-CM

## 2018-10-24 DIAGNOSIS — I10 Essential (primary) hypertension: Secondary | ICD-10-CM

## 2018-10-24 DIAGNOSIS — Z950 Presence of cardiac pacemaker: Secondary | ICD-10-CM

## 2018-10-24 LAB — COMPREHENSIVE METABOLIC PANEL
ALT: 10 U/L (ref 0–44)
AST: 27 U/L (ref 15–41)
Albumin: 3.4 g/dL — ABNORMAL LOW (ref 3.5–5.0)
Alkaline Phosphatase: 49 U/L (ref 38–126)
Anion gap: 11 (ref 5–15)
BUN: 10 mg/dL (ref 8–23)
CO2: 18 mmol/L — ABNORMAL LOW (ref 22–32)
Calcium: 7.7 mg/dL — ABNORMAL LOW (ref 8.9–10.3)
Chloride: 102 mmol/L (ref 98–111)
Creatinine, Ser: 1.36 mg/dL — ABNORMAL HIGH (ref 0.44–1.00)
GFR calc Af Amer: 40 mL/min — ABNORMAL LOW (ref >60)
GFR calc non Af Amer: 34 mL/min — ABNORMAL LOW (ref >60)
Glucose, Bld: 117 mg/dL — ABNORMAL HIGH (ref 70–99)
Potassium: 3.9 mmol/L (ref 3.5–5.1)
Sodium: 131 mmol/L — ABNORMAL LOW (ref 135–145)
Total Bilirubin: 0.8 mg/dL (ref 0.3–1.2)
Total Protein: 6.2 g/dL — ABNORMAL LOW (ref 6.5–8.1)

## 2018-10-24 LAB — CBC
HCT: 31.1 % — ABNORMAL LOW (ref 36.0–46.0)
Hemoglobin: 9.5 g/dL — ABNORMAL LOW (ref 12.0–15.0)
MCH: 30.4 pg (ref 26.0–34.0)
MCHC: 30.5 g/dL (ref 30.0–36.0)
MCV: 99.7 fL (ref 80.0–100.0)
Platelets: 187 10*3/uL (ref 150–400)
RBC: 3.12 MIL/uL — ABNORMAL LOW (ref 3.87–5.11)
RDW: 20.8 % — ABNORMAL HIGH (ref 11.5–15.5)
WBC: 5.8 10*3/uL (ref 4.0–10.5)
nRBC: 0 % (ref 0.0–0.2)

## 2018-10-24 LAB — URINALYSIS, ROUTINE W REFLEX MICROSCOPIC
Bilirubin Urine: NEGATIVE
Glucose, UA: NEGATIVE mg/dL
Ketones, ur: 5 mg/dL — AB
Nitrite: NEGATIVE
Protein, ur: NEGATIVE mg/dL
Specific Gravity, Urine: 1.013 (ref 1.005–1.030)
pH: 6 (ref 5.0–8.0)

## 2018-10-24 LAB — MAGNESIUM: Magnesium: 1.8 mg/dL (ref 1.7–2.4)

## 2018-10-24 MED ORDER — TRAMADOL HCL 50 MG PO TABS: 50.0000 mg | ORAL_TABLET | Freq: Four times a day (QID) | ORAL | Status: DC | PRN

## 2018-10-24 MED ORDER — METRONIDAZOLE IN NACL 5-0.79 MG/ML-% IV SOLN: 500.0000 mg | Freq: Three times a day (TID) | INTRAVENOUS | Status: DC

## 2018-10-24 MED ORDER — PRESERVISION AREDS 2 PO CAPS: 1.0000 | ORAL_CAPSULE | Freq: Two times a day (BID) | ORAL | Status: DC

## 2018-10-24 MED ORDER — SALINE SPRAY 0.65 % NA SOLN: 1.0000 | Freq: Two times a day (BID) | NASAL | Status: DC | PRN

## 2018-10-24 MED ORDER — PANTOPRAZOLE SODIUM 40 MG PO TBEC
40.0000 mg | DELAYED_RELEASE_TABLET | Freq: Two times a day (BID) | ORAL | Status: DC
  Administered 2018-10-24 – 2018-10-27 (×7): 40 mg via ORAL
  Filled 2018-10-24 (×7): qty 1

## 2018-10-24 MED ORDER — FUROSEMIDE 20 MG PO TABS
20.0000 mg | ORAL_TABLET | Freq: Every day | ORAL | Status: DC
  Administered 2018-10-24: 09:00:00 20 mg via ORAL
  Filled 2018-10-24: qty 1

## 2018-10-24 MED ORDER — SODIUM CHLORIDE 0.9 % IV SOLN
2.0000 g | INTRAVENOUS | Status: DC
  Administered 2018-10-24: 09:00:00 2 g via INTRAVENOUS
  Filled 2018-10-24: qty 20

## 2018-10-24 MED ORDER — POTASSIUM CHLORIDE CRYS ER 10 MEQ PO TBCR
10.0000 meq | EXTENDED_RELEASE_TABLET | Freq: Every day | ORAL | Status: DC
  Administered 2018-10-24 – 2018-10-25 (×2): 10 meq via ORAL
  Filled 2018-10-24 (×2): qty 1

## 2018-10-24 MED ORDER — OMEPRAZOLE MAGNESIUM 20 MG PO TBEC: 20.0000 mg | DELAYED_RELEASE_TABLET | Freq: Every day | ORAL | Status: DC

## 2018-10-24 MED ORDER — TRAMADOL HCL 50 MG PO TABS
50.0000 mg | ORAL_TABLET | Freq: Two times a day (BID) | ORAL | Status: DC | PRN
  Administered 2018-10-24 – 2018-10-26 (×4): 50 mg via ORAL
  Filled 2018-10-24 (×4): qty 1

## 2018-10-24 MED ORDER — QUETIAPINE FUMARATE 25 MG PO TABS
25.0000 mg | ORAL_TABLET | Freq: Two times a day (BID) | ORAL | Status: DC
  Administered 2018-10-24 – 2018-10-27 (×8): 25 mg via ORAL
  Filled 2018-10-24 (×8): qty 1

## 2018-10-24 MED ORDER — OCUVITE-LUTEIN PO CAPS
1.0000 | ORAL_CAPSULE | Freq: Every day | ORAL | Status: DC
  Administered 2018-10-24 – 2018-10-27 (×4): 1 via ORAL
  Filled 2018-10-24 (×4): qty 1

## 2018-10-24 MED ORDER — SODIUM CHLORIDE 0.9 % IV SOLN: 1.0000 g | INTRAVENOUS | Status: DC

## 2018-10-24 MED ORDER — POLYETHYL GLYCOL-PROPYL GLYCOL 0.4-0.3 % OP SOLN: 1.0000 [drp] | Freq: Two times a day (BID) | OPHTHALMIC | Status: DC | PRN

## 2018-10-24 MED ORDER — TRAZODONE HCL 50 MG PO TABS
150.0000 mg | ORAL_TABLET | Freq: Every day | ORAL | Status: DC
  Administered 2018-10-24 – 2018-10-26 (×3): 150 mg via ORAL
  Filled 2018-10-24 (×3): qty 3

## 2018-10-24 MED ORDER — PANTOPRAZOLE SODIUM 40 MG PO TBEC
40.0000 mg | DELAYED_RELEASE_TABLET | Freq: Every day | ORAL | Status: DC
  Administered 2018-10-24: 09:00:00 40 mg via ORAL
  Filled 2018-10-24: qty 1

## 2018-10-24 MED ORDER — SODIUM CHLORIDE 0.9 % IV SOLN: 2.0000 g | INTRAVENOUS | Status: DC

## 2018-10-24 MED ORDER — HYDROCODONE-ACETAMINOPHEN 5-325 MG PO TABS
1.0000 | ORAL_TABLET | Freq: Four times a day (QID) | ORAL | Status: DC | PRN
  Administered 2018-10-24 – 2018-10-25 (×3): 2 via ORAL
  Filled 2018-10-24 (×3): qty 2

## 2018-10-24 MED ORDER — CARVEDILOL 3.125 MG PO TABS
6.2500 mg | ORAL_TABLET | Freq: Two times a day (BID) | ORAL | Status: DC
  Administered 2018-10-24 – 2018-10-27 (×8): 6.25 mg via ORAL
  Filled 2018-10-24 (×8): qty 2

## 2018-10-24 MED ORDER — POLYVINYL ALCOHOL 1.4 % OP SOLN
1.0000 [drp] | Freq: Two times a day (BID) | OPHTHALMIC | Status: DC | PRN
  Filled 2018-10-24: qty 15

## 2018-10-24 MED ORDER — POTASSIUM CHLORIDE IN NACL 20-0.9 MEQ/L-% IV SOLN
INTRAVENOUS | Status: DC
  Administered 2018-10-24 – 2018-10-27 (×4): via INTRAVENOUS

## 2018-10-24 NOTE — Progress Notes (Signed)
Paged on call for pain medication.  Patient refusing to take Tylenol states"it doesn't do anything for my pain".

## 2018-10-24 NOTE — ED Provider Notes (Signed)
[facility] Provider Note   CSN: [id number] Arrival date & time: 10/23/18  1746     History   Chief Complaint Chief Complaint  Patient presents with   Rectal Bleeding    HPI Kimberly [name] is a 85 y.o. female.     Patient lives by herself.  She recently was admitted from June 9 through June 15 for a GI bleed.  Work-up at that time showed evidence of some diverticulitis and diverticulosis.  She was treated for the diverticulitis.  She has a complex allergy history so Cipro and Flagyl were not appropriate.  She was treated with cefepime.  Patient feels as if that is been getting better.  Today she had a bloody bowel movement.  And that is why she came back.  Patient is on Eliquis for atrial fib.  She has a pacemaker.  Patient restarted the Eliquis back at the time of discharge.  Patient with no chest pain shortness of breath no abdominal pain.  Patient without fevers no upper respiratory symptoms.     Past Medical History:  Diagnosis Date   Acute blood loss anemia 10/11/2012   Acute diverticulitis 08/24/2013   Acute on chronic combined systolic and diastolic CHF, NYHA class 4 (HCC) 11/15/2013   Antral ulcer 10/11/2012   Arrhythmia    atrial fibb   Atrial fibrillation (HCC)    Cardiomyopathy, nonischemic (HCC)    Chronic anticoagulation 10/12/2012   CKD (chronic kidney disease) stage 3, GFR 30-59 ml/min (HCC) 10/12/2012   Depression    Erosive esophagitis 10/11/2012   Fibromyalgia    Glaucoma    H/O echocardiogram 2007   EF 40-45%,          Hypertension    Osteoarthritis    Pacemaker    Last saw cards 07/2013   Scoliosis     Patient Active Problem List   Diagnosis Date Noted   Rectal bleeding 10/23/2018   Acute GI bleeding    Enteritis of small intestine due to enterotoxigenic Escherichia coli associated with diarrhea 03/24/2017   Bipolar affective disorder ([place]) 03/23/2017   Olfactory hallucinations 03/23/2017   Lower GI bleed     Glaucoma 02/28/2017   Pacemaker 03/21/2015   Non-ischemic cardiomyopathy ([place]) 11/28/2014   PUD (peptic ulcer disease) 06/14/2014   History of bipolar disorder    Hypokalemia 05/26/2014   Fall at home 05/26/2014   Chronic diastolic heart failure ([place]) 11/15/2013   Complete heart block ([place]) 11/15/2013   Inability to ambulate due to ankle or foot 09/05/2013   Unspecified constipation 08/26/2013   Anemia 08/25/2013   Acute diverticulitis 08/24/2013   CKD (chronic kidney disease) stage 3, GFR 30-59 ml/min (HCC) 10/12/2012   Chronic anticoagulation 10/12/2012   Osteoarthritis    Permanent atrial fibrillation    Biventricular cardiac pacemaker - [employer]    Fibromyalgia    Essential hypertension    Depression     Past Surgical History:  Procedure Laterality Date   ABDOMINAL HYSTERECTOMY     APPENDECTOMY     BACK SURGERY     BIOPSY  03/03/2017   Procedure: BIOPSY;  Surgeon: Danie Binder, MD;  Location: [facility];  Service: Endoscopy;;  gastric   BREAST SURGERY     CARDIAC CATHETERIZATION  12/08/2005   LAD AND LEFT MAIN WITH NO HIGH-GRADE STENOSIS. MILD DISEASE IN THE CX AND LAD SYSTEM. SEVERE LV DYSFUNCTION WITH DILATION OF THE LV. EF 15-20%. LV END-DIASTOLIC PRESSURE IS 90. +1 MR.  CHOLECYSTECTOMY     COLONOSCOPY N/A 03/03/2017   Procedure: COLONOSCOPY;  Surgeon: Danie Binder, MD;  Location: [facility];  Service: Endoscopy;  Laterality: N/A;   CYSTOSCOPY N/A 02/24/2013   Procedure: CYSTOSCOPY WITH URETHRAL DILITATION;  Surgeon: Marissa Nestle, MD;  Location: [facility];  Service: Urology;  Laterality: N/A;   DOPPLER ECHOCARDIOGRAPHY N/A 05/30/2010   LV SIZE IS NORMAL. LV SYSTOLIC FUNCTION IS LOW NORMAL. EF=50-55%. MILD INFERIOR HYPOKINESIS.MILD TO MODERATE POSTERIOR WALL HYPOKINESIS.PACEMAKER LEAD IN THE RV. LA IS MILDLY DILATED. RA IS MODERATE TO SEVERLY DILATED. PACEMAKER LEAD IN THE RA. MILD CALCICICATION OF THE MV APPARATUS.  MODERATE MR. MILD TO MODERATE TR. MILD PHTN.AV MILDLY SCLEROTIC.   ESOPHAGOGASTRODUODENOSCOPY N/A 10/13/2012   Dr. Gala Romney: severe ulcerative reflux esophagitis, question of Barrett's but negative path, single deep prepyloric antral ulcer, negative H.pylori   ESOPHAGOGASTRODUODENOSCOPY N/A 03/03/2017   Procedure: ESOPHAGOGASTRODUODENOSCOPY (EGD);  Surgeon: Danie Binder, MD;  [facility];  Service: Endoscopy;  Laterality: N/A;   HERNIA REPAIR     right inguinal hernia and umbilical   LOWETR EXT VENOUS Bilateral 11-08-10   R & L- NO EVIDENCE OF THROMBUS OR THROMBOPHLEBITIS. THERE IS MILD AMOUNT OF SUBCUTANEOUS EDEMA NOTED WITHIN THE LEFT CALF AND ANKLE. R & L GSV AND SSV- NO VENOUS INSUFF NOTED.   NECK SURGERY     NUCLEAR STRESS TEST N/A 02/13/2009   NORMAL PATTERN OF PERFUSION IN ALL REGIONS. POST STRESS VENTICULAR SIZE IS NORMAL. POST  STESS EF 85%.  NORMAL MYOCARDIAL PERFUSION STUDY.   PACEMAKER INSERTION     POLYPECTOMY  03/03/2017   Procedure: POLYPECTOMY;  Surgeon: Danie Binder, MD;  Location[facility];  Service: Endoscopy;;  colon   TONSILLECTOMY     YAG LASER APPLICATION Bilateral 8/33/8250   Procedure: YAG LASER APPLICATION;  Surgeon: Williams Che, MD;  Location: [facility];  Service: Ophthalmology;  Laterality: Bilateral;     OB History    Gravida  5   Para  2   Term  1   Preterm  1   AB  3   Living  2     SAB  3   TAB      Ectopic      Multiple      Live Births               Home Medications    Prior to Admission medications   Medication Sig Start Date End Date Taking? Authorizing Provider  acetaminophen (TYLENOL) 325 MG tablet Take 2 tablets (650 mg total) by mouth every 6 (six) hours as needed for mild pain, fever or headache (or Fever >/= 101). 10/17/18  Yes Emokpae, Courage, MD  apixaban (ELIQUIS) 2.5 MG TABS tablet Take 1 tablet (2.5 mg total) by mouth 2 (two) times daily. Hold onto Wednesday, 10/20/2018 10/20/18  Yes  Emokpae, Courage, MD  carvedilol (COREG) 6.25 MG tablet Take 1 tablet (6.25 mg total) by mouth 2 (two) times daily with a meal. 10/17/18  Yes Emokpae, Courage, MD  cefdinir (OMNICEF) 300 MG capsule Take 1 capsule (300 mg total) by mouth 2 (two) times daily for 7 days. 10/17/18 10/24/18 Yes Emokpae, Courage, MD  furosemide (LASIX) 20 MG tablet Take 1 tablet (20 mg total) by mouth daily. 10/17/18  Yes Emokpae, Courage, MD  metroNIDAZOLE (FLAGYL) 500 MG tablet Take 1 tablet (500 mg total) by mouth 3 (three) times daily for 7 days. 10/17/18 10/24/18 Yes Roxan Hockey, MD  Multiple Vitamins-Minerals (PRESERVISION AREDS 2) CAPS Take 1 capsule by mouth 2 (two) times daily.    Yes [provider]  omeprazole (PRILOSEC OTC) 20 MG tablet Take 1 tablet (20 mg total) by mouth daily. 10/17/18  Yes Emokpae, Courage, MD  ondansetron (ZOFRAN) 4 MG tablet Take 1 tablet (4 mg total) by mouth every 6 (six) hours as needed for nausea. 10/17/18  Yes Emokpae, Courage, MD  Polyethyl Glycol-Propyl Glycol (SYSTANE ULTRA) 0.4-0.3 % SOLN Place 1-2 drops 2 (two) times daily as needed into both eyes (dry eyes).    Yes [provider]  potassium chloride SA (K-DUR) 10 MEQ tablet Take 1 tablet (10 mEq total) by mouth daily. 10/17/18  Yes Emokpae, Courage, MD  QUEtiapine (SEROQUEL) 25 MG tablet Take 1 tablet (25 mg total) by mouth 2 (two) times daily. 10/17/18  Yes Emokpae, Courage, MD  sodium chloride (OCEAN) 0.65 % SOLN nasal spray Place 1 spray 2 (two) times daily as needed into both nostrils for congestion.    Yes [provider]  traZODone (DESYREL) 150 MG tablet Take 1 tablet (150 mg total) by mouth at bedtime. 10/17/18  Yes Emokpae, Courage, MD  oxyCODONE-acetaminophen (PERCOCET) 7.5-325 MG tablet Take 1 tablet by mouth every 6 (six) hours as needed for moderate pain or severe pain. Patient not taking: Reported on 10/22/2018 10/17/18 10/17/19  Roxan Hockey, MD  traMADol (ULTRAM) 50 MG tablet Take 1 tablet  (50 mg total) by mouth every 6 (six) hours as needed. Patient not taking: Reported on 10/22/2018 10/20/18 10/20/19  Fransico Meadow, PA-C    Family History Family History  Problem Relation Age of Onset   Cancer Sister    Asthma Sister    Heart failure Brother    Pulmonary embolism Brother    Cancer Brother    Colon cancer Neg Hx     Social History Social History   Tobacco Use   Smoking status: Former Smoker    Packs/day: 0.50    Years: 30.00    Pack years: 15.00    Types: Cigarettes    Quit date: 12/13/2004    Years since quitting: 13.8   Smokeless tobacco: Never Used   Tobacco comment: former smoker  Substance Use Topics   Alcohol use: Yes    Alcohol/week: 0.0 standard drinks    Comment: history of drinking a 1/2 glass of wine in the evening   Drug use: No     Allergies   Ciprofloxacin, Flagyl [metronidazole], Iron, Papaya derivatives, Iodine, Penicillins, and Sulfa antibiotics   Review of Systems Review of Systems  Constitutional: Negative for chills and fever.  HENT: Negative for rhinorrhea and sore throat.   Eyes: Negative for visual disturbance.  Respiratory: Negative for cough and shortness of breath.   Cardiovascular: Negative for chest pain and leg swelling.  Gastrointestinal: Positive for blood in stool. Negative for abdominal pain, diarrhea, nausea and vomiting.  Genitourinary: Negative for dysuria.  Musculoskeletal: Negative for back pain and neck pain.  Skin: Negative for rash.  Neurological: Negative for dizziness, light-headedness and headaches.  Hematological: Does not bruise/bleed easily.  Psychiatric/Behavioral: Negative for confusion.     Physical Exam Updated Vital Signs BP (!) 157/68 (BP Location: Left Arm)    Pulse 72    Temp 97.7 F (36.5 C) (Oral)    Resp 17    Ht 1.702 m (5\' 7" )    Wt 63.5 kg    SpO2 100%    BMI 21.93 kg/m   Physical Exam  Vitals signs and nursing note reviewed.  Constitutional:      General: She is not  in acute distress.    Appearance: She is well-developed.  HENT:     Head: Normocephalic and atraumatic.  Eyes:     Conjunctiva/sclera: Conjunctivae normal.  Neck:     Musculoskeletal: Neck supple.  Cardiovascular:     Rate and Rhythm: Normal rate and regular rhythm.     Heart sounds: No murmur.  Pulmonary:     Effort: Pulmonary effort is normal. No respiratory distress.     Breath sounds: Normal breath sounds.  Abdominal:     General: Bowel sounds are normal.     Palpations: Abdomen is soft.     Tenderness: There is no abdominal tenderness.  Genitourinary:    Rectum: Guaiac result positive.  Skin:    General: Skin is warm and dry.  Neurological:     General: No focal deficit present.     Mental Status: She is alert and oriented to person, place, and time.      ED Treatments / Results  Labs (all labs ordered are listed, but only abnormal results are displayed) Labs Reviewed  COMPREHENSIVE METABOLIC PANEL - Abnormal; Notable for the following components:      Result Value   Creatinine, Ser 1.48 (*)    Calcium 8.1 (*)    Total Protein 6.2 (*)    GFR calc non Af Amer 31 (*)    GFR calc Af Amer 36 (*)    All other components within normal limits  CBC WITH DIFFERENTIAL/PLATELET - Abnormal; Notable for the following components:   RBC 3.12 (*)    Hemoglobin 9.5 (*)    HCT 30.5 (*)    RDW 20.6 (*)    All other components within normal limits  CBC - Abnormal; Notable for the following components:   RBC 3.13 (*)    Hemoglobin 9.8 (*)    HCT 30.1 (*)    RDW 20.8 (*)    All other components within normal limits  POC OCCULT BLOOD, ED - Abnormal; Notable for the following components:   Fecal Occult Bld POSITIVE (*)    All other components within normal limits  SARS CORONAVIRUS 2 (HOSPITAL ORDER, [place])  LIPASE, BLOOD  COMPREHENSIVE METABOLIC PANEL  CBC  URINALYSIS, ROUTINE W REFLEX MICROSCOPIC  TYPE AND SCREEN    EKG EKG  Interpretation  Date/Time:  Saturday October 23 2018 19:42:37 EDT Ventricular Rate:  78 PR Interval:    QRS Duration: 142 QT Interval:  417 QTC Calculation: 475 R Axis:   -46 Text Interpretation:  Paced Rhythm No further analysis attempted due to paced rhythm Confirmed by Fredia Sorrow [phone]) on 10/23/2018 7:47:58 PM   Radiology Ct Abdomen Pelvis Wo Contrast  Result Date: 10/23/2018 CLINICAL DATA:  Recurrent rectal bleeding. EXAM: CT ABDOMEN AND PELVIS WITHOUT CONTRAST TECHNIQUE: Multidetector CT imaging of the abdomen and pelvis was performed following the standard protocol without IV contrast. COMPARISON:  10/12/2018 FINDINGS: Lower chest: Enlarged heart. Calcific atherosclerotic disease of the coronary arteries and aorta. Partially visualized cardiac pacemaker leads. Hiatal hernia. Hepatobiliary: Hepatic cysts noted. Status post cholecystectomy. No biliary dilatation. Pancreas: Unremarkable. No pancreatic ductal dilatation or surrounding inflammatory changes. Spleen: Normal in size without focal abnormality. Adrenals/Urinary Tract: Adrenal glands are unremarkable. Kidneys are without focal lesion, or hydronephrosis. Punctate right nephrolithiasis, stable. Bladder is unremarkable. Stomach/Bowel: Stomach is within normal limits. No evidence of appendicitis. No evidence of small bowel  wall thickening, distention, or inflammatory changes. Extensive left colonic diverticulosis. Mild diverticulitis at the level of the proximal descending colon and mid sigmoid colon. No evidence of rupture or abscess formation. Vascular/Lymphatic: Aortic atherosclerosis. No enlarged abdominal or pelvic lymph nodes. Reproductive: Status post hysterectomy. No adnexal masses. Other: No abdominopelvic ascites. Musculoskeletal: Osteopenia, scoliosis and diffuse spondylosis of the spine. IMPRESSION: 1. Extensive left colonic diverticulosis with mild diverticulitis at the level of the proximal descending colon and mid sigmoid  colon. No evidence of rupture or abscess formation. 2. Punctate right nephrolithiasis. 3. Enlarged heart. 4. Calcific atherosclerotic disease of the coronary arteries and aorta. Electronically Signed   By: Fidela Salisbury M.D.   On: 10/23/2018 20:06    Procedures Procedures (including critical care time)  Medications Ordered in ED Medications  0.9 %  sodium chloride infusion ( Intravenous New Bag/Given 10/23/18 2003)  acetaminophen (TYLENOL) tablet 650 mg (has no administration in time range)    Or  acetaminophen (TYLENOL) suppository 650 mg (has no administration in time range)  ondansetron (ZOFRAN) injection 4 mg (has no administration in time range)  metroNIDAZOLE (FLAGYL) IVPB 500 mg (500 mg Intravenous New Bag/Given 10/23/18 2215)     Initial Impression / Assessment and Plan / ED Course  I have reviewed the triage vital signs and the nursing notes.  Pertinent labs & imaging results that were available during my care of the patient were reviewed by me and considered in my medical decision making (see chart for details).        Stool is heme positive.  Vital signs stable here.  No significant anemia.  Hemoglobin is in the 9 area little down from her last admission.  Hospitalist will admit for GI bleed.  CT scan still showed some mild diverticulitis.  I did not choose to start her on antibiotics at this time.  They will admit and observe.  Final Clinical Impressions(s) / ED Diagnoses   Final diagnoses:  Acute GI bleeding    ED Discharge Orders    None       Fredia Sorrow, MD 10/24/18 [phone]

## 2018-10-24 NOTE — Progress Notes (Signed)
PROGRESS NOTE  Kimberly [name] ZYS:[id number] DOB: 1927-04-21 DOA: 10/23/2018 PCP: [name], DO  Brief History:  85 y.o.femalewith medical history significant forbipolar disorder, atrial fibrillation on Eliquis, chronic kidney disease stage III, anxiety, chronic diastolic CHF, and hypertension, now presenting with hematochezia and right lower quadrant abdominal pain.  The patient was recently mated to the hospital from 10/13/2018 through 10/18/2018 for acute diverticulitis.  She was discharged home with cefdinir and metronidazole.  She stated that at the time of discharge, she did not have any hematochezia.  However she began noticing hematochezia again on 10/20/2018.  It has become more frequent and increasing in volume.  As result, the patient presented for further evaluation.  The patient states that during this past week she has taken approximately 4 tablets of ibuprofen.  She continues to have right lower quadrant abdominal pain which is about the same as at the time she was discharged on 10/18/2018.  She has had some nausea without any vomiting or frank diarrhea.  There is been no fevers, chills, chest pain, shortness breath, coughing, hemoptysis.  The patient was instructed to hold her apixaban until 10/20/2018 with which she complied.  In the emergency department, the patient was afebrile hemodynamically stable saturating 100% room air.  Hemoglobin was 9.5.  Patient had a hemoglobin of 10.5 on 10/17/2018.  Assessment/Plan: Diverticulitis/Hematochezia -GI Consult -Start ceftriaxone and metronidazole -Monitor serial hemoglobin -Judicious pain control -10/23/2018 CT abdomen and pelvis--mild diverticulitis in the descending colon and mid sigmoid. -colonoscopy in 2018: Patient had 5 4 to 8 mm polyps in the rectum, sigmoid colon and proximal transverse colon, ascending colon, which were removed with a hot snare.  Diverticulosis in the rectosigmoid colon and sigmoid colon.  Internal  hemorrhoids. -clear liquids pending GI eval  Permanent Atrial fibrillation -CHADSVASC 5 (age, gender, CHF and hypertension) -Eliquis currently held given GI bleed -Continue Coreg with holding parameters  CKD stage III -Baseline creatinine 1.1-1.4 -A.m. BMP  Chronic diastolic CHF -Currently appears to be compensated and euvolemic.  She does have mild lower extremity swelling which has been there for months. -holding lasix temporarily -Monitor intake and output, daily weights  Bipolar disorder -Appears to be calm and cooperative -Continue Seroquel, trazodone  Essential hypertension -continue coreg -Lasix held temporarily  Left lower extremity erythema -Patient states that this has been chronic and unchanged -Monitor clinically   Disposition Plan:   Home in 2-3 days  Family Communication:  No Family at bedside  Consultants:  GI  Code Status:  FULL   DVT Prophylaxis: apixaban on hold   Procedures: As Listed in Progress Note Above  Antibiotics: Ceftriaxone 6/21>>> Metronidazole 6/21>>>         Subjective: Patient denies fevers, chills, headache, chest pain, dyspnea, nausea, vomiting, diarrhea, dysuria, hematuria, hematochezia, and melena. She complains of abdominal pain in the right lower quadrant  Objective: Vitals:   10/23/18 1752 10/23/18 2218 10/24/18 0500 10/24/18 0636  BP:  (!) 157/68  (!) 146/71  Pulse:  72  78  Resp:  17  17  Temp:  97.7 F (36.5 C)  97.9 F (36.6 C)  TempSrc:  Oral    SpO2:  100%  100%  Weight: 63.5 kg  63.4 kg   Height: 5\' 7"  (1.702 m)  5\' 7"  (1.702 m)     Intake/Output Summary (Last 24 hours) at 10/24/2018 0958 Last data filed at 10/24/2018 0900 Gross per 24 hour  Intake 1101.25 ml  Output -  Net 1101.25 ml   Weight change:  Exam:   General:  Pt is alert, follows commands appropriately, not in acute distress  HEENT: No icterus, No thrush, No neck mass, [place]/AT  Cardiovascular: RRR, S1/S2, no rubs, no gallops   Respiratory: Bibasilar scattered rales.  No wheezing.  Good air movement.  Abdomen: Soft/+BS, non tender, non distended, no guarding  Extremities: Mild erythema left lower extremity edema, No lymphangitis, No petechiae, No rashes, no synovitis   Data Reviewed: I have personally reviewed following labs and imaging studies Basic Metabolic Panel: Recent Labs  Lab 10/23/18 1813  NA 137  K 3.6  CL 102  CO2 24  GLUCOSE 94  BUN 13  CREATININE 1.48*  CALCIUM 8.1*   Liver Function Tests: Recent Labs  Lab 10/23/18 1813  AST 26  ALT 14  ALKPHOS 51  BILITOT 0.4  PROT 6.2*  ALBUMIN 3.5   Recent Labs  Lab 10/23/18 1813  LIPASE 45   No results for input(s): AMMONIA in the last 168 hours. Coagulation Profile: No results for input(s): INR, PROTIME in the last 168 hours. CBC: Recent Labs  Lab 10/23/18 1813 10/23/18 2248  WBC 6.6 7.8  NEUTROABS 4.7  --   HGB 9.5* 9.8*  HCT 30.5* 30.1*  MCV 97.8 96.2  PLT 199 204   Cardiac Enzymes: No results for input(s): CKTOTAL, CKMB, CKMBINDEX, TROPONINI in the last 168 hours. BNP: Invalid input(s): POCBNP CBG: Recent Labs  Lab 10/18/18 0544  GLUCAP 81   HbA1C: No results for input(s): HGBA1C in the last 72 hours. Urine analysis:    Component Value Date/Time   COLORURINE YELLOW 04/19/2018 0523   APPEARANCEUR CLEAR 04/19/2018 0523   LABSPEC 1.013 04/19/2018 0523   PHURINE 7.0 04/19/2018 0523   GLUCOSEU NEGATIVE 04/19/2018 0523   HGBUR NEGATIVE 04/19/2018 0523   BILIRUBINUR NEGATIVE 04/19/2018 0523   BILIRUBINUR neg 04/25/2014 0855   KETONESUR NEGATIVE 04/19/2018 0523   PROTEINUR NEGATIVE 04/19/2018 0523   UROBILINOGEN 0.2 05/26/2014 1415   NITRITE NEGATIVE 04/19/2018 0523   LEUKOCYTESUR TRACE (A) 04/19/2018 0523   Sepsis Labs: @LABRCNTIP (procalcitonin:4,lacticidven:4) ) Recent Results (from the past 240 hour(s))  SARS Coronavirus 2 (CEPHEID - Performed in [place] hospital lab), Hosp Order     Status: None    Collection Time: 10/23/18  8:30 PM   Specimen: Nasopharyngeal Swab  Result Value Ref Range Status   SARS Coronavirus 2 NEGATIVE NEGATIVE Final    Comment: (NOTE) If result is NEGATIVE SARS-CoV-2 target nucleic acids are NOT DETECTED. The SARS-CoV-2 RNA is generally detectable in upper and lower  respiratory specimens during the acute phase of infection. The lowest  concentration of SARS-CoV-2 viral copies this assay can detect is 250  copies / mL. A negative result does not preclude SARS-CoV-2 infection  and should not be used as the sole basis for treatment or other  patient management decisions.  A negative result may occur with  improper specimen collection / handling, submission of specimen other  than nasopharyngeal swab, presence of viral mutation(s) within the  areas targeted by this assay, and inadequate number of viral copies  (<250 copies / mL). A negative result must be combined with clinical  observations, patient history, and epidemiological information. If result is POSITIVE SARS-CoV-2 target nucleic acids are DETECTED. The SARS-CoV-2 RNA is generally detectable in upper and lower  respiratory specimens dur ing the acute phase of infection.  Positive  results are indicative of active infection with SARS-CoV-2.  Clinical  correlation with patient history and other diagnostic information is  necessary to determine patient infection status.  Positive results do  not rule out bacterial infection or co-infection with other viruses. If result is PRESUMPTIVE POSTIVE SARS-CoV-2 nucleic acids MAY BE PRESENT.   A presumptive positive result was obtained on the submitted specimen  and confirmed on repeat testing.  While 2019 novel coronavirus  (SARS-CoV-2) nucleic acids may be present in the submitted sample  additional confirmatory testing may be necessary for epidemiological  and / or clinical management purposes  to differentiate between  SARS-CoV-2 and other Sarbecovirus  currently known to infect humans.  If clinically indicated additional testing with an alternate test  methodology [id number]) is advised. The SARS-CoV-2 RNA is generally  detectable in upper and lower respiratory sp ecimens during the acute  phase of infection. The expected result is Negative. Fact Sheet for Patients:  StrictlyIdeas.no Fact Sheet for Healthcare Providers: BankingDealers.co.za This test is not yet approved or cleared by the [place] FDA and has been authorized for detection and/or diagnosis of SARS-CoV-2 by FDA under an Emergency Use Authorization (EUA).  This EUA will remain in effect (meaning this test can be used) for the duration of the COVID-19 declaration under Section 564(b)(1) of the Act, 21 U.S.C. section 360bbb-3(b)(1), unless the authorization is terminated or revoked sooner. Performed at [facility], [address] [place], [place] [address]      Scheduled Meds: . carvedilol  6.25 mg Oral BID WC  . furosemide  20 mg Oral Daily  . multivitamin-lutein  1 capsule Oral Daily  . pantoprazole  40 mg Oral Daily  . potassium chloride  10 mEq Oral Daily  . QUEtiapine  25 mg Oral BID  . traZODone  150 mg Oral QHS   Continuous Infusions: . cefTRIAXone (ROCEPHIN)  IV 2 g (10/24/18 0905)  . metronidazole 500 mg (10/24/18 0550)    Procedures/Studies: Ct Abdomen Pelvis Wo Contrast  Result Date: 10/23/2018 CLINICAL DATA:  Recurrent rectal bleeding. EXAM: CT ABDOMEN AND PELVIS WITHOUT CONTRAST TECHNIQUE: Multidetector CT imaging of the abdomen and pelvis was performed following the standard protocol without IV contrast. COMPARISON:  10/12/2018 FINDINGS: Lower chest: Enlarged heart. Calcific atherosclerotic disease of the coronary arteries and aorta. Partially visualized cardiac pacemaker leads. Hiatal hernia. Hepatobiliary: Hepatic cysts noted. Status post cholecystectomy. No biliary dilatation. Pancreas: Unremarkable.  No pancreatic ductal dilatation or surrounding inflammatory changes. Spleen: Normal in size without focal abnormality. Adrenals/Urinary Tract: Adrenal glands are unremarkable. Kidneys are without focal lesion, or hydronephrosis. Punctate right nephrolithiasis, stable. Bladder is unremarkable. Stomach/Bowel: Stomach is within normal limits. No evidence of appendicitis. No evidence of small bowel wall thickening, distention, or inflammatory changes. Extensive left colonic diverticulosis. Mild diverticulitis at the level of the proximal descending colon and mid sigmoid colon. No evidence of rupture or abscess formation. Vascular/Lymphatic: Aortic atherosclerosis. No enlarged abdominal or pelvic lymph nodes. Reproductive: Status post hysterectomy. No adnexal masses. Other: No abdominopelvic ascites. Musculoskeletal: Osteopenia, scoliosis and diffuse spondylosis of the spine. IMPRESSION: 1. Extensive left colonic diverticulosis with mild diverticulitis at the level of the proximal descending colon and mid sigmoid colon. No evidence of rupture or abscess formation. 2. Punctate right nephrolithiasis. 3. Enlarged heart. 4. Calcific atherosclerotic disease of the coronary arteries and aorta. Electronically Signed   By: Fidela Salisbury M.D.   On: 10/23/2018 20:06   Ct Abdomen Pelvis Wo Contrast  Result Date: 10/12/2018 CLINICAL DATA:  85 year old female with abdominal pain for 3-4 days, rectal  bleeding for 2-3 weeks. EXAM: CT ABDOMEN AND PELVIS WITHOUT CONTRAST TECHNIQUE: Multidetector CT imaging of the abdomen and pelvis was performed following the standard protocol without IV contrast. COMPARISON:  CT Abdomen and [facility] 04/07/2018, and earlier. FINDINGS: Lower chest: Chronic cardiomegaly. Cardiac pacemaker leads. Chronic mild lung base scarring. No pericardial or pleural effusion. Hepatobiliary: Surgically absent gallbladder. Stable noncontrast liver with several small benign cysts or  hemangiomas. Pancreas: Negative. Spleen: Negative. Adrenals/Urinary Tract: Normal adrenal glands. Punctate right nephrolithiasis is stable. No hydronephrosis. Proximal ureters are decompressed. Mild bilateral perinephric stranding is chronic. Diminutive and unremarkable urinary bladder. Stomach/Bowel: Negative rectum. Mild mesenteric stranding about the sigmoid colon with severe underlying diverticulosis (series 2, image 60). Indistinct appearance of the sigmoid in this region. No extraluminal gas or fluid. Diverticulosis continues in the descending colon with a 2nd area of questionable mild mesenteric inflammation on series 2, image 29. Redundant transverse colon also with diverticulosis, no active inflammation. No diverticulosis in the right colon. Negative terminal ileum. No dilated small bowel. Small to moderate gastric hiatal hernia is stable. No free air, free fluid. Vascular/Lymphatic: Extensive Aortoiliac calcified atherosclerosis. Vascular patency is not evaluated in the absence of IV contrast. No lymphadenopathy. Reproductive: Surgically absent. Other: No pelvic free fluid. Musculoskeletal: Osteopenia, scoliosis, spine degeneration. No acute osseous abnormality identified. IMPRESSION: 1. Severe diverticulosis of the descending and sigmoid colon. Mild Acute Diverticulitis in the mid sigmoid, and perhaps a 2nd site of active inflammation also in the mid descending colon. No perforation, abscess or complicating features. 2. No other acute or inflammatory process in the abdomen or pelvis. 3.  Aortic Atherosclerosis (ICD10-I70.0). Electronically Signed   By: Genevie Ann M.D.   On: 10/12/2018 22:53   Dg Knee Complete 4 Views Left  Result Date: 10/20/2018 CLINICAL DATA:  Fall with pain and swelling EXAM: LEFT KNEE - COMPLETE 4+ VIEW COMPARISON:  10/09/2018 FINDINGS: No acute displaced fracture or malalignment. Mild mediolateral joint space degenerative change. Joint space calcifications. Vascular calcification.  No significant knee effusion. IMPRESSION: 1. No acute osseous abnormality. 2. Chondrocalcinosis.  Mild degenerative changes. Electronically Signed   By: Donavan Foil M.D.   On: 10/20/2018 20:04   Dg Knee Complete 4 Views Right  Result Date: 10/20/2018 CLINICAL DATA:  Fall EXAM: RIGHT KNEE - COMPLETE 4+ VIEW COMPARISON:  None. FINDINGS: No acute displaced fracture or malalignment. Moderate arthritis of the lateral compartment. Trace knee effusion. Vascular calcifications. IMPRESSION: 1. No definite acute osseous abnormality.  Trace knee effusion. Electronically Signed   By: Donavan Foil M.D.   On: 10/20/2018 20:03    Orson Eva, DO  [facility] Pager [phone]  If 7PM-7AM, please contact night-coverage [email or web address] Password [email or web address] 10/24/2018, 9:58 AM   LOS: 0 days

## 2018-10-24 NOTE — Consult Note (Signed)
Referring Provider: No ref. provider found Primary Care Physician:  [name], DO Primary Gastroenterologist:  Barney Drain  Reason for Consultation:  RECTAL BLEEDING  Impression: ADMITTED WITH RECTAL BLEEDING ON ELIQUIS AND TAKING IBUPROFEN PRN. DIFFERENTIAL DIAGNOSIS INCLUDES HEMORRHOIDS, DIVERTICULOSIS, OR AVMs.HEARTBURN NOT CONTROLLED ON OMEPRAZOLE. COLONOSCOPY IN 2018 FOR HEMATOCHEZIA SHOWED DIVERTICULOSIS.  CT SHOWS MORE THAN LIKELY RESOLVING DIVERTICULITIS DUE TO IMAGING WITHIN 30 DAYS OF ACUTE DIAGNOSIS. PT COMPLETED CEFDINIR AND HAS R SIDE ABDOMINAL PAIN. THE IMAGING LAGS BEHIND CLINICAL RESOLUTION.  Plan: 1. SUPPORTIVE CARE. 2. ADVANCE DIET. 3. ADD PROTONIX BID 4. PT SHOULD AVOID IBUPROFEN. [place]. CONSIDER BENEFITS V. RISKS OF CONTINUED ANTICOAGULATION. 6. ADD ANUSOL HC SUPPOSITORIES BID. 7. NO INDICATION FOR ENDOSCOPY AT THIS TIME. 8. D/C ABX AND MONITOR FOR WORSENING LEFT SIDED ABDOMINAL PAIN.   HPI:  PT SEEN IN ED JUN 9 FOR BRBPR AND CT SHOWED DIVERTICULITIS. PT COMPLETED ABX. HAS BEEN USING IBUPROFEN 1-2X/WEEK FOR A HEADACHE WHILE TAKING ELIQUIS. SHE INTERMITTENTLY SEE BRBPR ASSOCIATED WITH CONSTIPATION.  WITHIN THE LAST 1.5 WEEKS BRBPR BECAME MORE FREQUENT BUT NO MORE THAN QUARTER IZE AMOUNTS. WAS HAVING MORE THAN 3-4 A DAY. HAD FECAL INCONTINENCE IN THE MIDDLE OF THE NIGHT OF JUN 18/19 RIGHT AFTER FINISHING 7 DAYS OF ABX. SHE DENIS RECTAL PAIN, ITCHING OR PRESSURE. SHE HAS MILD NAUSEA,   Chest pain: same. SHE MAY HAVE A SMALL BM EVERY TIME SHE URINATES. HAS "SUBJECTIVE" CHILLS, HAVING INDIGESTION THAT IS NOT CONTROLLED BY OMEPRAZOLE. WOULD LIKE SOMETHING TO EAT. HAS CHRONIC INTERMITTENT RIGHT SIDE ABDOMINAL PAIN. USES PRESSURE POINTS TO REDUCE HEAD PAIN. Lost her ride to MD visits.   PT DENIES FEVER, CHILLS, HEMATEMESIS, vomiting, melena, diarrhea, CHEST PAIN, SHORTNESS OF BREATH,  CHANGE IN BOWEL IN HABITS, OR problems swallowing.  Past Medical  History:  Diagnosis Date  . Acute blood loss anemia 10/11/2012  . Acute diverticulitis 08/24/2013  . Acute on chronic combined systolic and diastolic CHF, NYHA class 4 ([place]) 11/15/2013  . Antral ulcer 10/11/2012  . Arrhythmia    atrial fibb  . Atrial fibrillation ([place])   . Cardiomyopathy, nonischemic ([place])   . Chronic anticoagulation 10/12/2012  . CKD (chronic kidney disease) stage 3, GFR 30-59 ml/min (HCC) 10/12/2012  . Depression   . Erosive esophagitis 10/11/2012  . Fibromyalgia   . Glaucoma   . H/O echocardiogram 2007   EF 40-45%,         . Hypertension   . Osteoarthritis   . Pacemaker    Last saw cards 07/2013  . Scoliosis     Past Surgical History:  Procedure Laterality Date  . ABDOMINAL HYSTERECTOMY    . APPENDECTOMY    . BACK SURGERY    . BIOPSY  03/03/2017   Procedure: BIOPSY;  Surgeon: Danie Binder, MD;  Location: [facility];  Service: Endoscopy;;  gastric  . BREAST SURGERY    . CARDIAC CATHETERIZATION  12/08/2005   LAD AND LEFT MAIN WITH NO HIGH-GRADE STENOSIS. MILD DISEASE IN THE CX AND LAD SYSTEM. SEVERE LV DYSFUNCTION WITH DILATION OF THE LV. EF 15-20%. LV END-DIASTOLIC PRESSURE IS 90. +1 MR.  . CHOLECYSTECTOMY    . COLONOSCOPY N/A 03/03/2017   Procedure: COLONOSCOPY;  Surgeon: Danie Binder, MD;  Location: [facility];  Service: Endoscopy;  Laterality: N/A;  . CYSTOSCOPY N/A 02/24/2013   Procedure: CYSTOSCOPY WITH URETHRAL DILITATION;  Surgeon: Marissa Nestle, MD;  Location: [facility] ORS;  Service: Urology;  Laterality: N/A;  . DOPPLER  ECHOCARDIOGRAPHY N/A 05/30/2010   LV SIZE IS NORMAL. LV SYSTOLIC FUNCTION IS LOW NORMAL. EF=50-55%. MILD INFERIOR HYPOKINESIS.MILD TO MODERATE POSTERIOR WALL HYPOKINESIS.PACEMAKER LEAD IN THE RV. LA IS MILDLY DILATED. RA IS MODERATE TO SEVERLY DILATED. PACEMAKER LEAD IN THE RA. MILD CALCICICATION OF THE MV APPARATUS. MODERATE MR. MILD TO MODERATE TR. MILD PHTN.AV MILDLY SCLEROTIC.  [name] ESOPHAGOGASTRODUODENOSCOPY N/A 10/13/2012   Dr.  Gala Romney: severe ulcerative reflux esophagitis, question of Barrett's but negative path, single deep prepyloric antral ulcer, negative H.pylori  . ESOPHAGOGASTRODUODENOSCOPY N/A 03/03/2017   Procedure: ESOPHAGOGASTRODUODENOSCOPY (EGD);  Surgeon: Danie Binder, MD;  [facility];  Service: Endoscopy;  Laterality: N/A;  . HERNIA REPAIR     right inguinal hernia and umbilical  . LOWETR EXT VENOUS Bilateral 11-08-10   R & L- NO EVIDENCE OF THROMBUS OR THROMBOPHLEBITIS. THERE IS MILD AMOUNT OF SUBCUTANEOUS EDEMA NOTED WITHIN THE LEFT CALF AND ANKLE. R & L GSV AND SSV- NO VENOUS INSUFF NOTED.  [name] NECK SURGERY    . NUCLEAR STRESS TEST N/A 02/13/2009   NORMAL PATTERN OF PERFUSION IN ALL REGIONS. POST STRESS VENTICULAR SIZE IS NORMAL. POST  STESS EF 85%.  NORMAL MYOCARDIAL PERFUSION STUDY.  [name] PACEMAKER INSERTION    . POLYPECTOMY  03/03/2017   Procedure: POLYPECTOMY;  Surgeon: Danie Binder, MD;  Location[facility];  Service: Endoscopy;;  colon  . TONSILLECTOMY    . YAG LASER APPLICATION Bilateral 2/35/3614   Procedure: YAG LASER APPLICATION;  Surgeon: Williams Che, MD;  Location: [facility];  Service: Ophthalmology;  Laterality: Bilateral;    Prior to Admission medications   Medication Sig Start Date End Date Taking? Authorizing Provider  acetaminophen (TYLENOL) 325 MG tablet Take 2 tablets (650 mg total) by mouth every 6 (six) hours as needed for mild pain, fever or headache (or Fever >/= 101). 10/17/18  Yes Emokpae, Courage, MD  apixaban (ELIQUIS) 2.5 MG TABS tablet Take 1 tablet (2.5 mg total) by mouth 2 (two) times daily. Hold onto Wednesday, 10/20/2018 10/20/18  Yes Emokpae, Courage, MD  carvedilol (COREG) 6.25 MG tablet Take 1 tablet (6.25 mg total) by mouth 2 (two) times daily with a meal. 10/17/18  Yes Emokpae, Courage, MD  cefdinir (OMNICEF) 300 MG capsule Take 1 capsule (300 mg total) by mouth 2 (two) times daily for 7 days. 10/17/18 10/24/18 Yes Emokpae, Courage, MD  furosemide  (LASIX) 20 MG tablet Take 1 tablet (20 mg total) by mouth daily. 10/17/18  Yes Emokpae, Courage, MD  metroNIDAZOLE (FLAGYL) 500 MG tablet Take 1 tablet (500 mg total) by mouth 3 (three) times daily for 7 days. 10/17/18 10/24/18 Yes Emokpae, Courage, MD  Multiple Vitamins-Minerals (PRESERVISION AREDS 2) CAPS Take 1 capsule by mouth 2 (two) times daily.    Yes [provider]  omeprazole (PRILOSEC OTC) 20 MG tablet Take 1 tablet (20 mg total) by mouth daily. 10/17/18  Yes Emokpae, Courage, MD  ondansetron (ZOFRAN) 4 MG tablet Take 1 tablet (4 mg total) by mouth every 6 (six) hours as needed for nausea. 10/17/18  Yes Emokpae, Courage, MD  Polyethyl Glycol-Propyl Glycol (SYSTANE ULTRA) 0.4-0.3 % SOLN Place 1-2 drops 2 (two) times daily as needed into both eyes (dry eyes).    Yes [provider]  potassium chloride SA (K-DUR) 10 MEQ tablet Take 1 tablet (10 mEq total) by mouth daily. 10/17/18  Yes Emokpae, Courage, MD  QUEtiapine (SEROQUEL) 25 MG tablet Take 1 tablet (25 mg total) by mouth 2 (two) times  daily. 10/17/18  Yes Emokpae, Courage, MD  sodium chloride (OCEAN) 0.65 % SOLN nasal spray Place 1 spray 2 (two) times daily as needed into both nostrils for congestion.    Yes [provider]  traZODone (DESYREL) 150 MG tablet Take 1 tablet (150 mg total) by mouth at bedtime. 10/17/18  Yes Emokpae, Courage, MD  oxyCODONE-acetaminophen (PERCOCET) 7.5-325 MG tablet Take 1 tablet by mouth every 6 (six) hours as needed for moderate pain or severe pain. Patient not taking: Reported on 10/22/2018 10/17/18 10/17/19  Roxan Hockey, MD  traMADol (ULTRAM) 50 MG tablet Take 1 tablet (50 mg total) by mouth every 6 (six) hours as needed. Patient not taking: Reported on 10/22/2018 10/20/18 10/20/19  Fransico Meadow, PA-C    Current Facility-Administered Medications  Medication Dose Route Frequency Provider Last Rate Last Dose  . acetaminophen (TYLENOL) tablet 650 mg  650 mg Oral Q6H PRN Jani Gravel, MD        Or  . acetaminophen (TYLENOL) suppository 650 mg  650 mg Rectal Q6H PRN Jani Gravel, MD      . carvedilol (COREG) tablet 6.25 mg  6.25 mg Oral BID WC Jani Gravel, MD   6.25 mg at 10/24/18 0857  . cefTRIAXone (ROCEPHIN) 2 g in sodium chloride 0.9 % 100 mL IVPB  2 g Intravenous Q24H Tat, Shanon Brow, MD 200 mL/hr at 10/24/18 0905 2 g at 10/24/18 0905  . furosemide (LASIX) tablet 20 mg  20 mg Oral Daily Jani Gravel, MD   20 mg at 10/24/18 0857  . HYDROcodone-acetaminophen (NORCO/VICODIN) 5-325 MG per tablet 1-2 tablet  1-2 tablet Oral Q6H PRN Vertis Kelch, NP   2 tablet at 10/24/18 0737  . metroNIDAZOLE (FLAGYL) IVPB 500 mg  500 mg Intravenous Lysle Dingwall, MD 100 mL/hr at 10/24/18 0550 500 mg at 10/24/18 0550  . multivitamin-lutein (OCUVITE-LUTEIN) capsule 1 capsule  1 capsule Oral Daily Tat, David, MD   1 capsule at 10/24/18 0858  . ondansetron (ZOFRAN) injection 4 mg  4 mg Intravenous Q6H PRN Jani Gravel, MD      . pantoprazole (PROTONIX) EC tablet 40 mg  40 mg Oral Daily Tat, David, MD   40 mg at 10/24/18 0857  . polyvinyl alcohol (LIQUIFILM TEARS) 1.4 % ophthalmic solution 1-2 drop  1-2 drop Both Eyes BID PRN Tat, David, MD      . potassium chloride (K-DUR) CR tablet 10 mEq  10 mEq Oral Daily Jani Gravel, MD   10 mEq at 10/24/18 0857  . QUEtiapine (SEROQUEL) tablet 25 mg  25 mg Oral BID Jani Gravel, MD   25 mg at 10/24/18 0857  . sodium chloride (OCEAN) 0.65 % nasal spray 1 spray  1 spray Each Nare BID PRN Jani Gravel, MD      . traMADol Veatrice Bourbon) tablet 50 mg  50 mg Oral Q12H PRN Tat, Shanon Brow, MD      . traZODone (DESYREL) tablet 150 mg  150 mg Oral QHS Jani Gravel, MD        Allergies as of 10/23/2018 - Review Complete 10/23/2018  Allergen Reaction Noted  . Ciprofloxacin Other (See Comments) 03/21/2017  . Flagyl [metronidazole] Other (See Comments) 03/21/2017  . Iron Swelling 04/10/2014  . Papaya derivatives Hives 01/22/2011  . Iodine Rash and Other (See Comments) 01/22/2011  .  Penicillins Hives 01/22/2011  . Sulfa antibiotics Rash 01/22/2011    Family History  Problem Relation Age of Onset  . Cancer Sister   . Asthma Sister   .  Heart failure Brother   . Pulmonary embolism Brother   . Cancer Brother   . Colon cancer Neg Hx      Social History   Socioeconomic History  . Marital status: Widowed    Spouse name: Not on file  . Number of children: Not on file  . Years of education: Not on file  . Highest education level: Some college, no degree  Occupational History  . 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 Financial resource strain: Not very hard  . Food insecurity    Worry: Never true    Inability: Never true  . Transportation needs    Medical: No    Non-medical: No  Tobacco Use  . Smoking status: Former Smoker    Packs/day: 0.50    Years: 30.00    Pack years: 15.00    Types: Cigarettes    Quit date: 12/13/2004    Years since quitting: 13.8  . Smokeless tobacco: Never Used  . Tobacco comment: former smoker  Substance and Sexual Activity  . Alcohol use: Yes    Alcohol/week: 0.0 standard drinks    Comment: history of drinking a 1/2 glass of wine in the evening  . Drug use: No  . Sexual activity: Never  Lifestyle  . Physical activity    Days per week: 0 days    Minutes per session: Not on file  . Stress: Only a little  Relationships  . Social Herbalist on phone: Once a week    Gets together: Never    Attends religious service: Never    Active member of club or organization: No    Attends meetings of clubs or organizations: Never    Relationship status: Widowed  . Intimate partner violence    Fear of current or ex partner: No    Emotionally abused: No    Physically abused: No    Forced sexual activity: No  Other Topics Concern  . Not on file  Social History Narrative   No home exercise program. PT ordered this week.    Review of Systems: PER HPI OTHERWISE ALL  SYSTEMS ARE NEGATIVE.   Vitals: Blood pressure (!) 146/71, pulse 78, temperature 97.9 F (36.6 C), resp. rate 17, height 5\' 7"  (1.702 m), weight 63.4 kg, SpO2 100 %.  Physical Exam: General:   Alert,  Well-developed, well-nourished, pleasant and cooperative in NAD Head:  Normocephalic and atraumatic. Eyes:  Sclera clear, no icterus.   Conjunctiva pink. Mouth:  No lesions, dentition ABnormal. Neck:  Supple; no masses. Lungs:  Clear throughout to auscultation.   No wheezes. No acute distress. Heart:  Regular rate and rhythm; no murmurs. Abdomen:  Soft, MILDLY tender IN RLQ and nondistended. No masses noted. Normal bowel sounds, without guarding, and without rebound.   Msk:  With gross deformities ON LEFT LOWER LEG. ABNormal posture. Extremities:  With edema AND ERYTHEMA OF LOWER EXTREMITIES Neurologic:  Alert and  oriented x4; NO  NEW FOCAL DEFICITS Cervical Nodes:  No significant cervical adenopathy. Psych:  Alert and cooperative. Normal mood and affect.   Lab Results: Recent Labs    10/23/18 1813 10/23/18 2248  WBC 6.6 7.8  HGB 9.5* 9.8*  HCT 30.5* 30.1*  PLT 199 204   BMET Recent Labs    10/23/18 1813  NA 137  K 3.6  CL 102  CO2 24  GLUCOSE 94  BUN 13  CREATININE 1.48*  CALCIUM 8.1*   LFT Recent Labs    10/23/18 1813  PROT 6.2*  ALBUMIN 3.5  AST 26  ALT 14  ALKPHOS 51  BILITOT 0.4     Studies/Results: CT ABD/PELVIS WO IVC JUN 9: ACUTE DIVERTICULITIS(MILD [place]/DC), CT JUN 20 ABD/PELVIS W/O IVC-MILD DIVERTICULOSIS(DC/[place])   LOS: 0 days   Kimberly [name]  10/24/2018, 9:57 AM

## 2018-10-25 ENCOUNTER — Ambulatory Visit: Payer: Self-pay | Admitting: Licensed Clinical Social Worker

## 2018-10-25 DIAGNOSIS — Z90710 Acquired absence of both cervix and uterus: Secondary | ICD-10-CM | POA: Diagnosis not present

## 2018-10-25 DIAGNOSIS — D62 Acute posthemorrhagic anemia: Secondary | ICD-10-CM | POA: Diagnosis present

## 2018-10-25 DIAGNOSIS — K219 Gastro-esophageal reflux disease without esophagitis: Secondary | ICD-10-CM

## 2018-10-25 DIAGNOSIS — K5792 Diverticulitis of intestine, part unspecified, without perforation or abscess without bleeding: Secondary | ICD-10-CM | POA: Diagnosis not present

## 2018-10-25 DIAGNOSIS — I428 Other cardiomyopathies: Secondary | ICD-10-CM | POA: Diagnosis present

## 2018-10-25 DIAGNOSIS — F331 Major depressive disorder, recurrent, moderate: Secondary | ICD-10-CM

## 2018-10-25 DIAGNOSIS — Z882 Allergy status to sulfonamides status: Secondary | ICD-10-CM | POA: Diagnosis not present

## 2018-10-25 DIAGNOSIS — F3178 Bipolar disorder, in full remission, most recent episode mixed: Secondary | ICD-10-CM

## 2018-10-25 DIAGNOSIS — Z881 Allergy status to other antibiotic agents status: Secondary | ICD-10-CM | POA: Diagnosis not present

## 2018-10-25 DIAGNOSIS — Z87891 Personal history of nicotine dependence: Secondary | ICD-10-CM | POA: Diagnosis not present

## 2018-10-25 DIAGNOSIS — K922 Gastrointestinal hemorrhage, unspecified: Secondary | ICD-10-CM

## 2018-10-25 DIAGNOSIS — Z7401 Bed confinement status: Secondary | ICD-10-CM | POA: Diagnosis not present

## 2018-10-25 DIAGNOSIS — Z7901 Long term (current) use of anticoagulants: Secondary | ICD-10-CM | POA: Diagnosis not present

## 2018-10-25 DIAGNOSIS — Z880 Allergy status to penicillin: Secondary | ICD-10-CM | POA: Diagnosis not present

## 2018-10-25 DIAGNOSIS — F419 Anxiety disorder, unspecified: Secondary | ICD-10-CM | POA: Diagnosis present

## 2018-10-25 DIAGNOSIS — K625 Hemorrhage of anus and rectum: Secondary | ICD-10-CM | POA: Diagnosis not present

## 2018-10-25 DIAGNOSIS — Z1159 Encounter for screening for other viral diseases: Secondary | ICD-10-CM | POA: Diagnosis not present

## 2018-10-25 DIAGNOSIS — Z8249 Family history of ischemic heart disease and other diseases of the circulatory system: Secondary | ICD-10-CM | POA: Diagnosis not present

## 2018-10-25 DIAGNOSIS — Z809 Family history of malignant neoplasm, unspecified: Secondary | ICD-10-CM | POA: Diagnosis not present

## 2018-10-25 DIAGNOSIS — I130 Hypertensive heart and chronic kidney disease with heart failure and stage 1 through stage 4 chronic kidney disease, or unspecified chronic kidney disease: Secondary | ICD-10-CM | POA: Diagnosis present

## 2018-10-25 DIAGNOSIS — I4821 Permanent atrial fibrillation: Secondary | ICD-10-CM

## 2018-10-25 DIAGNOSIS — F319 Bipolar disorder, unspecified: Secondary | ICD-10-CM | POA: Diagnosis present

## 2018-10-25 DIAGNOSIS — K279 Peptic ulcer, site unspecified, unspecified as acute or chronic, without hemorrhage or perforation: Secondary | ICD-10-CM

## 2018-10-25 DIAGNOSIS — I872 Venous insufficiency (chronic) (peripheral): Secondary | ICD-10-CM | POA: Diagnosis present

## 2018-10-25 DIAGNOSIS — Z950 Presence of cardiac pacemaker: Secondary | ICD-10-CM | POA: Diagnosis not present

## 2018-10-25 DIAGNOSIS — I10 Essential (primary) hypertension: Secondary | ICD-10-CM | POA: Diagnosis not present

## 2018-10-25 DIAGNOSIS — K5733 Diverticulitis of large intestine without perforation or abscess with bleeding: Secondary | ICD-10-CM | POA: Diagnosis present

## 2018-10-25 DIAGNOSIS — Z825 Family history of asthma and other chronic lower respiratory diseases: Secondary | ICD-10-CM | POA: Diagnosis not present

## 2018-10-25 DIAGNOSIS — I878 Other specified disorders of veins: Secondary | ICD-10-CM | POA: Diagnosis present

## 2018-10-25 DIAGNOSIS — Z8711 Personal history of peptic ulcer disease: Secondary | ICD-10-CM | POA: Diagnosis not present

## 2018-10-25 DIAGNOSIS — R5381 Other malaise: Secondary | ICD-10-CM | POA: Diagnosis not present

## 2018-10-25 DIAGNOSIS — N183 Chronic kidney disease, stage 3 (moderate): Secondary | ICD-10-CM | POA: Diagnosis not present

## 2018-10-25 DIAGNOSIS — Z888 Allergy status to other drugs, medicaments and biological substances status: Secondary | ICD-10-CM | POA: Diagnosis not present

## 2018-10-25 DIAGNOSIS — I5042 Chronic combined systolic (congestive) and diastolic (congestive) heart failure: Secondary | ICD-10-CM | POA: Diagnosis present

## 2018-10-25 DIAGNOSIS — Z9049 Acquired absence of other specified parts of digestive tract: Secondary | ICD-10-CM | POA: Diagnosis not present

## 2018-10-25 LAB — CBC
HCT: 29.7 % — ABNORMAL LOW (ref 36.0–46.0)
Hemoglobin: 9.5 g/dL — ABNORMAL LOW (ref 12.0–15.0)
MCH: 31 pg (ref 26.0–34.0)
MCHC: 32 g/dL (ref 30.0–36.0)
MCV: 97.1 fL (ref 80.0–100.0)
Platelets: 200 10*3/uL (ref 150–400)
RBC: 3.06 MIL/uL — ABNORMAL LOW (ref 3.87–5.11)
RDW: 20.7 % — ABNORMAL HIGH (ref 11.5–15.5)
WBC: 7.9 10*3/uL (ref 4.0–10.5)
nRBC: 0 % (ref 0.0–0.2)

## 2018-10-25 LAB — BASIC METABOLIC PANEL
Anion gap: 11 (ref 5–15)
BUN: 12 mg/dL (ref 8–23)
CO2: 21 mmol/L — ABNORMAL LOW (ref 22–32)
Calcium: 8.2 mg/dL — ABNORMAL LOW (ref 8.9–10.3)
Chloride: 106 mmol/L (ref 98–111)
Creatinine, Ser: 1.38 mg/dL — ABNORMAL HIGH (ref 0.44–1.00)
GFR calc Af Amer: 39 mL/min — ABNORMAL LOW (ref >60)
GFR calc non Af Amer: 34 mL/min — ABNORMAL LOW (ref >60)
Glucose, Bld: 91 mg/dL (ref 70–99)
Potassium: 4.2 mmol/L (ref 3.5–5.1)
Sodium: 138 mmol/L (ref 135–145)

## 2018-10-25 LAB — MAGNESIUM: Magnesium: 1.6 mg/dL — ABNORMAL LOW (ref 1.7–2.4)

## 2018-10-25 MED ORDER — MAGNESIUM SULFATE 2 GM/50ML IV SOLN
2.0000 g | Freq: Once | INTRAVENOUS | Status: AC
  Administered 2018-10-25: 13:00:00 2 g via INTRAVENOUS
  Filled 2018-10-25: qty 50

## 2018-10-25 NOTE — Plan of Care (Signed)
  Problem: Acute Rehab PT Goals(only PT should resolve) Goal: Pt Will Go Supine/Side To Sit Outcome: Progressing Flowsheets (Taken 10/25/2018 1605) Pt will go Supine/Side to Sit: with modified independence Goal: Patient Will Transfer Sit To/From Stand Outcome: Progressing Flowsheets (Taken 10/25/2018 1605) Patient will transfer sit to/from stand: with supervision Goal: Pt Will Transfer Bed To Chair/Chair To Bed Outcome: Progressing Flowsheets (Taken 10/25/2018 1605) Pt will Transfer Bed to Chair/Chair to Bed: with supervision Goal: Pt Will Ambulate Outcome: Progressing Flowsheets (Taken 10/25/2018 1605) Pt will Ambulate:  75 feet  with min guard assist  with rolling walker   4:06 PM, 10/25/18 Lonell Grandchild, MPT Physical Therapist with [facility] [phone] office [phone] mobile phone

## 2018-10-25 NOTE — Telephone Encounter (Signed)
Attempted to contact patient - NA °

## 2018-10-25 NOTE — Patient Instructions (Addendum)
Licensed Clinical Social Worker Visit Information  Materials Provided: No   Client had reported to [name] last Friday that she had been recently hospitalized. She was discharged from the hospital and returned to her home with supports in place. Client  said that social worker from [place] visited with her at her home on 10/22/2018 to assess client status and needs. Client said visit with Education officer, museum from [place] went well. Client uses a walker to ambulate. She said she had bee doing well with her meals. She said she had some difficulty sleeping. Client said she likes to relax by watching TV,listening to music, or likes to read and write letters). Client had said last Friday that  she had swelling below her knees in both legs. She said it had bee hard to wear her shoes currently. She said she had her prescribed medications and was taking medications as prescribed.She said she does not want to go to skilled nursing facility and wants to remain at home with supports in place. [name] had talked with client about home health services being received by client in her home.  Today, on [name] phone call with client, client said again that she had experienced a fall last Monday at her home. She said she has now gone to [facility] for observation due to that fall.  She said she did not have any broken bones.  She spoke of bruises from her fall.   She said she was very sore with bruises and was at hospital for observation. She said she planned to return home when able with supports in place. [name] informed client that [name] would call client in next 2 weeks to check on client status and needs. Client agreed to this plan.   Follow Up Plan:[name] to call client in next 2 weeks to assess client status and needs.   The patient verbalized understanding of instructions provided today and declined a print copy of patient instruction materials.   [name].Raahi Korber MSW, [name] Licensed Clinical Social  Worker [facility] Care Management [phone]

## 2018-10-25 NOTE — Care Management Obs Status (Signed)
[place] NOTIFICATION   Patient Details  Name: Kimberly [name] MRN: [id number] Date of Birth: 1926-07-20   Medicare Observation Status Notification Given:  Yes    Ihor Gully, [name] 10/25/2018, 10:28 AM

## 2018-10-25 NOTE — TOC Initial Note (Signed)
Transition of Care [facility]) - Initial/Assessment Note    Patient Details  Name: Kimberly [name] MRN: [id number] Date of Birth: 03-05-1927  Transition of Care [facility] CM/SW Contact:    Ihor Gully, [name] Phone Number: 10/25/2018, 1:25 PM  Clinical Narrative:                 Patient is known to [facility] and discharge services were arranged at her last admission (10/13/2018-10/18/2018). Patient active with [facility] and [facility].  Patient has open case with [facility] APS. [place] with APS indicates that patient is open but not in need of protective services as she has capacity.  SNF for PT rehabilitation was discussed and patient is agreeable to short term rehab.  Referrals sent to requested providers.   Expected Discharge Plan: Skilled Nursing Facility Barriers to Discharge: Insurance Authorization   Patient Goals and CMS Choice Patient states their goals for this hospitalization and ongoing recovery are:: Patient feels she needs SNF to rehabilitate and then return to her home. CMS Medicare.gov Compare Post Acute Care list provided to:: Patient Choice offered to / list presented to : Patient  Expected Discharge Plan and Services Expected Discharge Plan: [place] In-house Referral: Clinical Social Work   Post Acute Care Choice: Durable Medical Equipment Living arrangements for the past 2 months: Single Family Home Expected Discharge Date: 10/25/18                                    Prior Living Arrangements/Services Living arrangements for the past 2 months: Single Family Home Lives with:: Self Patient language and need for interpreter reviewed:: Yes Do you feel safe going back to the place where you live?: Yes      Need for Family Participation in Patient Care: Yes (Comment) Care giver support system in place?: Yes (comment) Current home services: Home RN, Home PT, Homehealth aide, Other (comment)(Social Work, [facility]) Criminal Activity/Legal Involvement  Pertinent to Current Situation/Hospitalization: No - Comment as needed  Activities of Daily Living Home Assistive Devices/Equipment: Environmental consultant (specify type)(2 front wheel type) ADL Screening (condition at time of admission) Patient's cognitive ability adequate to safely complete daily activities?: Yes Is the patient deaf or have difficulty hearing?: No Does the patient have difficulty seeing, even when wearing glasses/contacts?: Yes(left eye mac degen.) Does the patient have difficulty concentrating, remembering, or making decisions?: No Patient able to express need for assistance with ADLs?: Yes Does the patient have difficulty dressing or bathing?: Yes(due to her OA) Independently performs ADLs?: Yes (appropriate for developmental age) Does the patient have difficulty walking or climbing stairs?: Yes(uses walker and falls frequently) Weakness of Legs: Both Weakness of Arms/Hands: Right  Permission Sought/Granted Permission sought to share information with : Other (comment)(Stephanie Kayleen Memos, APS Education officer, museum)          Permission granted to share info w Relationship: [place], [facility] APS     Emotional Assessment Appearance:: Appears stated age   Affect (typically observed): Accepting, Calm Orientation: : Oriented to Self, Oriented to Place, Oriented to  Time, Oriented to Situation Alcohol / Substance Use: Not Applicable Psych Involvement: No (comment)  Admission diagnosis:  Acute GI bleeding [K92.2] Patient Active Problem List   Diagnosis Date Noted  . Rectal bleeding 10/23/2018  . Acute GI bleeding   . Enteritis of small intestine due to enterotoxigenic Escherichia coli associated with diarrhea 03/24/2017  . Bipolar affective disorder ([place]) 03/23/2017  .  Olfactory hallucinations 03/23/2017  . Lower GI bleed   . Glaucoma 02/28/2017  . Pacemaker 03/21/2015  . Non-ischemic cardiomyopathy ([place]) 11/28/2014  . PUD (peptic ulcer disease) 06/14/2014  . History of bipolar  disorder   . Hypokalemia 05/26/2014  . Fall at home 05/26/2014  . Chronic diastolic heart failure ([place]) 11/15/2013  . Complete heart block ([place]) 11/15/2013  . Inability to ambulate due to ankle or foot 09/05/2013  . Unspecified constipation 08/26/2013  . Anemia 08/25/2013  . Acute diverticulitis 08/24/2013  . CKD (chronic kidney disease) stage 3, GFR 30-59 ml/min (HCC) 10/12/2012  . Chronic anticoagulation 10/12/2012  . Osteoarthritis   . Permanent atrial fibrillation   . Biventricular cardiac pacemaker - [employer] Consulta   . Fibromyalgia   . Essential hypertension   . Depression    PCP:  [name], DO Pharmacy:   [facility], [name] [address] Phone: [phone] Fax: [place], [place] [facility] [address] [place] [address] [place] [address] Phone: [phone] Fax: [phone]     Social Determinants of Health ([place]) Interventions    Readmission Risk Interventions Readmission Risk Prevention Plan 10/15/2018  Transportation Screening Complete  PCP or Specialist Appt within 3-5 Days Complete  HRI or [place] Complete  Social Work Consult for [place] Planning/Counseling Complete  Palliative Care Screening Not Applicable  Medication Review Press photographer) Complete  Some recent data might be hidden

## 2018-10-25 NOTE — Chronic Care Management (AMB) (Signed)
  Chronic Care Management    Clinical Social Work CCM Outreach Note  10/25/2018 Name: Kimberly [name] MRN: [id number] DOB: 03-15-1927  Kimberly [name] is a 85 y.o. year old female who is a primary care patient of Kimberly Norlander, DO . The CCM team was consulted for assistance with assessment of psychosocial needs  [name] reached out to Kimberly [name] today by phone    Social Determinants of Health: Risk for Depression; Risk for Social Isolation     Patient Outreach Telephone from 10/22/2018 in [place]  PHQ-9 Total Score  6     Client had reported to [name] last Friday that she had been recently hospitalized. She was discharged from the hospital and returned to her home with supports in place. Client  said that social worker from [place] visited with her at her home on 10/22/2018 to assess client status and needs. Client said visit with Education officer, museum from [place] went well. Client uses a walker to ambulate. She said she had bee doing well with her meals. She said she had some difficulty sleeping. Client said she likes to relax by watching TV,listening to music, or likes to read and write letters). Client had said last Friday that  she had swelling below her knees in both legs. She said it had bee hard to wear her shoes currently. She said she had her prescribed medications and was taking medications as prescribed.She said she does not want to go to skilled nursing facility and wants to remain at home with supports in place. [name] had talked with client about home health services being received by client in her home.  Today, on [name] phone call with client, client said again that she had experienced a fall last Monday at her home. She said she has now gone to [facility] for observation due to that fall.  She said she did not have any broken bones.  She spoke of bruises from her fall.   She said she was very sore with bruises and was at hospital for observation. She  said she planned to return home when able with supports in place. [name] informed client that [name] would call client in next 2 weeks to check on client status and needs. Client agreed to this plan.   Follow Up Plan: [name] to call client in next 2 weeks to assess client status and needs.   Kimberly [name].Kimberly [name] MSW, [name] Licensed Clinical Social Worker [facility] [phone]

## 2018-10-25 NOTE — Progress Notes (Signed)
PROGRESS NOTE  Kimberly [name] VVO:[id number] DOB: 1926-08-08 DOA: 10/23/2018 PCP: [name], DO  Brief History:  85 y.o.femalewith medical history significant forbipolar disorder, atrial fibrillation on Eliquis, chronic kidney disease stage III, anxiety, chronic diastolic CHF, and hypertension, now presenting with hematochezia and right lower quadrant abdominal pain.  The patient was recently mated to the hospital from 10/13/2018 through 10/18/2018 for acute diverticulitis.  She was discharged home with cefdinir and metronidazole.  She stated that at the time of discharge, she did not have any hematochezia.  However she began noticing hematochezia again on 10/20/2018.  It has become more frequent and increasing in volume.  As result, the patient presented for further evaluation.  The patient states that during this past week she has taken approximately 4 tablets of ibuprofen.  She continues to have right lower quadrant abdominal pain which is about the same as at the time she was discharged on 10/18/2018.  She has had some nausea without any vomiting or frank diarrhea.  There is been no fevers, chills, chest pain, shortness breath, coughing, hemoptysis.  The patient was instructed to hold her apixaban until 10/20/2018 with which she complied.  In the emergency department, the patient was afebrile hemodynamically stable saturating 100% room air.  Hemoglobin was 9.5.  Patient had a hemoglobin of 10.5 on 10/17/2018.  Assessment/Plan: Diverticulitis/Hematochezia -GI Consult appreciated -pt had drop in Hgb of 1 gram with hematochezia and requires close monitoring for further blood loss anemia -Started ceftriaxone and metronidazole-->discontinue per GI and observe clinically as pt finished appropriate course from her last hospitalization -Monitor serial hemoglobin--stable since admission -Judicious pain control -10/23/2018 CT abdomen and pelvis--mild diverticulitis in the descending colon and  mid sigmoid. -colonoscopy in 2018:Patient had 5 4 to 8 mm polyps in the rectum, sigmoid colon and proximal transverse colon, ascending colon, which were removed with a hot snare. Diverticulosis in the rectosigmoid colon and sigmoid colon. Internal hemorrhoids. -advanced diet to soft diet  Permanent Atrial fibrillation -CHADSVASC 5 (age, gender, CHF and hypertension) -Eliquis currently held given GI bleed -Continue Coreg with holding parameters -restart apixaban when/if okay with GI--if not risk>benefit, will change to ASA  CKD stage III -Baseline creatinine 1.1-1.4 -A.m. BMP  Chronic diastolic CHF -Currently appears to be compensated and euvolemic. She does have mild lower extremity swelling which has been there for months. -holding lasix temporarily -Monitor intake and output, daily weights  Bipolar disorder -Appears to be calm and cooperative -Continue Seroquel, trazodone  Essential hypertension -continue coreg -Lasix held temporarily  Left lower extremity erythema -Patient states that this has been chronic and unchanged -Monitor clinically        Disposition Plan:   SNF when cleared by GI--hopeful 6/23 Family Communication:   No Family at bedside  Consultants:  [facility]  Code Status:  FULL   DVT Prophylaxis:  apixaban on hold   Procedures: As Listed in Progress Note Above  Antibiotics: None    Subjective: Pt complains of pain all over.  She complains of burning from head to toe.  Denies f/c, cp, sob,  She states she always have abd pain.  Denies nausea, vomiting, hematochezia, melena  Objective: Vitals:   10/24/18 2306 10/25/18 0515 10/25/18 0900 10/25/18 1119  BP: (!) 144/92 (!) 141/59  119/63  Pulse: 80 76  75  Resp: 17 17  (!) 22  Temp: 98.2 F (36.8 C) 98.3 F (36.8 C)  98.2 F (36.8 C)  TempSrc:    Oral  SpO2: 100% 100% 99% 99%  Weight:  64.5 kg    Height:        Intake/Output Summary (Last 24 hours) at 10/25/2018 1243  Last data filed at 10/25/2018 0900 Gross per 24 hour  Intake 1285.84 ml  Output 960 ml  Net 325.84 ml   Weight change: 1 kg Exam:   General:  Pt is alert, follows commands appropriately, not in acute distress  HEENT: No icterus, No thrush, No neck mass, [place]/AT  Cardiovascular: IRRR, S1/S2, no rubs, no gallops  Respiratory: bibasilar rales, no wheeze  Abdomen: Soft/+BS, non tender, non distended, no guarding  Extremities: mild erythema LLE. No lymphangitis, No petechiae, No rashes, no synovitis   Data Reviewed: I have personally reviewed following labs and imaging studies Basic Metabolic Panel: Recent Labs  Lab 10/23/18 1813 10/24/18 1153 10/25/18 0528  NA 137 131* 138  K 3.6 3.9 4.2  CL 102 102 106  CO2 24 18* 21*  GLUCOSE 94 117* 91  BUN 13 10 12   CREATININE 1.48* 1.36* 1.38*  CALCIUM 8.1* 7.7* 8.2*  MG  --  1.8 1.6*   Liver Function Tests: Recent Labs  Lab 10/23/18 1813 10/24/18 1153  AST 26 27  ALT 14 10  ALKPHOS 51 49  BILITOT 0.4 0.8  PROT 6.2* 6.2*  ALBUMIN 3.5 3.4*   Recent Labs  Lab 10/23/18 1813  LIPASE 45   No results for input(s): AMMONIA in the last 168 hours. Coagulation Profile: No results for input(s): INR, PROTIME in the last 168 hours. CBC: Recent Labs  Lab 10/23/18 1813 10/23/18 2248 10/24/18 1153 10/25/18 0528  WBC 6.6 7.8 5.8 7.9  NEUTROABS 4.7  --   --   --   HGB 9.5* 9.8* 9.5* 9.5*  HCT 30.5* 30.1* 31.1* 29.7*  MCV 97.8 96.2 99.7 97.1  PLT 199 204 187 200   Cardiac Enzymes: No results for input(s): CKTOTAL, CKMB, CKMBINDEX, TROPONINI in the last 168 hours. BNP: Invalid input(s): POCBNP CBG: No results for input(s): GLUCAP in the last 168 hours. HbA1C: No results for input(s): HGBA1C in the last 72 hours. Urine analysis:    Component Value Date/Time   COLORURINE AMBER (A) 10/23/2018 0910   APPEARANCEUR CLOUDY (A) 10/23/2018 0910   LABSPEC 1.013 10/23/2018 0910   PHURINE 6.0 10/23/2018 0910   GLUCOSEU  NEGATIVE 10/23/2018 0910   HGBUR MODERATE (A) 10/23/2018 0910   BILIRUBINUR NEGATIVE 10/23/2018 0910   BILIRUBINUR neg 04/25/2014 0855   KETONESUR 5 (A) 10/23/2018 0910   PROTEINUR NEGATIVE 10/23/2018 0910   UROBILINOGEN 0.2 05/26/2014 1415   NITRITE NEGATIVE 10/23/2018 0910   LEUKOCYTESUR MODERATE (A) 10/23/2018 0910   Sepsis Labs: @LABRCNTIP (procalcitonin:4,lacticidven:4) ) Recent Results (from the past 240 hour(s))  SARS Coronavirus 2 (CEPHEID - Performed in [facility] hospital lab), Hosp Order     Status: None   Collection Time: 10/23/18  8:30 PM   Specimen: Nasopharyngeal Swab  Result Value Ref Range Status   SARS Coronavirus 2 NEGATIVE NEGATIVE Final    Comment: (NOTE) If result is NEGATIVE SARS-CoV-2 target nucleic acids are NOT DETECTED. The SARS-CoV-2 RNA is generally detectable in upper and lower  respiratory specimens during the acute phase of infection. The lowest  concentration of SARS-CoV-2 viral copies this assay can detect is 250  copies / mL. A negative result does not preclude SARS-CoV-2 infection  and should not be used as the sole basis for treatment or other  patient management decisions.  A negative result may occur with  improper specimen collection / handling, submission of specimen other  than nasopharyngeal swab, presence of viral mutation(s) within the  areas targeted by this assay, and inadequate number of viral copies  (<250 copies / mL). A negative result must be combined with clinical  observations, patient history, and epidemiological information. If result is POSITIVE SARS-CoV-2 target nucleic acids are DETECTED. The SARS-CoV-2 RNA is generally detectable in upper and lower  respiratory specimens dur ing the acute phase of infection.  Positive  results are indicative of active infection with SARS-CoV-2.  Clinical  correlation with patient history and other diagnostic information is  necessary to determine patient infection status.  Positive  results do  not rule out bacterial infection or co-infection with other viruses. If result is PRESUMPTIVE POSTIVE SARS-CoV-2 nucleic acids MAY BE PRESENT.   A presumptive positive result was obtained on the submitted specimen  and confirmed on repeat testing.  While 2019 novel coronavirus  (SARS-CoV-2) nucleic acids may be present in the submitted sample  additional confirmatory testing may be necessary for epidemiological  and / or clinical management purposes  to differentiate between  SARS-CoV-2 and other Sarbecovirus currently known to infect humans.  If clinically indicated additional testing with an alternate test  methodology [phone]) is advised. The SARS-CoV-2 RNA is generally  detectable in upper and lower respiratory sp ecimens during the acute  phase of infection. The expected result is Negative. Fact Sheet for Patients:  StrictlyIdeas.no Fact Sheet for Healthcare Providers: BankingDealers.co.za This test is not yet approved or cleared by the [place] FDA and has been authorized for detection and/or diagnosis of SARS-CoV-2 by FDA under an Emergency Use Authorization (EUA).  This EUA will remain in effect (meaning this test can be used) for the duration of the COVID-19 declaration under Section 564(b)(1) of the Act, 21 U.S.C. section 360bbb-3(b)(1), unless the authorization is terminated or revoked sooner. Performed at [facility], [address] [place], [place] [address]      Scheduled Meds: . carvedilol  6.25 mg Oral BID WC  . multivitamin-lutein  1 capsule Oral Daily  . pantoprazole  40 mg Oral BID AC  . QUEtiapine  25 mg Oral BID  . traZODone  150 mg Oral QHS   Continuous Infusions: . 0.9 % NaCl with KCl 20 mEq / L 50 mL/hr at 10/25/18 0521  . magnesium sulfate bolus IVPB      Procedures/Studies: Ct Abdomen Pelvis Wo Contrast  Result Date: 10/23/2018 CLINICAL DATA:  Recurrent rectal bleeding. EXAM: CT  ABDOMEN AND PELVIS WITHOUT CONTRAST TECHNIQUE: Multidetector CT imaging of the abdomen and pelvis was performed following the standard protocol without IV contrast. COMPARISON:  10/12/2018 FINDINGS: Lower chest: Enlarged heart. Calcific atherosclerotic disease of the coronary arteries and aorta. Partially visualized cardiac pacemaker leads. Hiatal hernia. Hepatobiliary: Hepatic cysts noted. Status post cholecystectomy. No biliary dilatation. Pancreas: Unremarkable. No pancreatic ductal dilatation or surrounding inflammatory changes. Spleen: Normal in size without focal abnormality. Adrenals/Urinary Tract: Adrenal glands are unremarkable. Kidneys are without focal lesion, or hydronephrosis. Punctate right nephrolithiasis, stable. Bladder is unremarkable. Stomach/Bowel: Stomach is within normal limits. No evidence of appendicitis. No evidence of small bowel wall thickening, distention, or inflammatory changes. Extensive left colonic diverticulosis. Mild diverticulitis at the level of the proximal descending colon and mid sigmoid colon. No evidence of rupture or abscess formation. Vascular/Lymphatic: Aortic atherosclerosis. No enlarged abdominal or pelvic lymph nodes. Reproductive: Status post hysterectomy. No adnexal masses. Other: No abdominopelvic ascites.  Musculoskeletal: Osteopenia, scoliosis and diffuse spondylosis of the spine. IMPRESSION: 1. Extensive left colonic diverticulosis with mild diverticulitis at the level of the proximal descending colon and mid sigmoid colon. No evidence of rupture or abscess formation. 2. Punctate right nephrolithiasis. 3. Enlarged heart. 4. Calcific atherosclerotic disease of the coronary arteries and aorta. Electronically Signed   By: Fidela Salisbury M.D.   On: 10/23/2018 20:06   Ct Abdomen Pelvis Wo Contrast  Result Date: 10/12/2018 CLINICAL DATA:  85 year old female with abdominal pain for 3-4 days, rectal bleeding for 2-3 weeks. EXAM: CT ABDOMEN AND PELVIS WITHOUT  CONTRAST TECHNIQUE: Multidetector CT imaging of the abdomen and pelvis was performed following the standard protocol without IV contrast. COMPARISON:  CT Abdomen and [facility] 04/07/2018, and earlier. FINDINGS: Lower chest: Chronic cardiomegaly. Cardiac pacemaker leads. Chronic mild lung base scarring. No pericardial or pleural effusion. Hepatobiliary: Surgically absent gallbladder. Stable noncontrast liver with several small benign cysts or hemangiomas. Pancreas: Negative. Spleen: Negative. Adrenals/Urinary Tract: Normal adrenal glands. Punctate right nephrolithiasis is stable. No hydronephrosis. Proximal ureters are decompressed. Mild bilateral perinephric stranding is chronic. Diminutive and unremarkable urinary bladder. Stomach/Bowel: Negative rectum. Mild mesenteric stranding about the sigmoid colon with severe underlying diverticulosis (series 2, image 60). Indistinct appearance of the sigmoid in this region. No extraluminal gas or fluid. Diverticulosis continues in the descending colon with a 2nd area of questionable mild mesenteric inflammation on series 2, image 29. Redundant transverse colon also with diverticulosis, no active inflammation. No diverticulosis in the right colon. Negative terminal ileum. No dilated small bowel. Small to moderate gastric hiatal hernia is stable. No free air, free fluid. Vascular/Lymphatic: Extensive Aortoiliac calcified atherosclerosis. Vascular patency is not evaluated in the absence of IV contrast. No lymphadenopathy. Reproductive: Surgically absent. Other: No pelvic free fluid. Musculoskeletal: Osteopenia, scoliosis, spine degeneration. No acute osseous abnormality identified. IMPRESSION: 1. Severe diverticulosis of the descending and sigmoid colon. Mild Acute Diverticulitis in the mid sigmoid, and perhaps a 2nd site of active inflammation also in the mid descending colon. No perforation, abscess or complicating features. 2. No other acute or  inflammatory process in the abdomen or pelvis. 3.  Aortic Atherosclerosis (ICD10-I70.0). Electronically Signed   By: Genevie Ann M.D.   On: 10/12/2018 22:53   Dg Knee Complete 4 Views Left  Result Date: 10/20/2018 CLINICAL DATA:  Fall with pain and swelling EXAM: LEFT KNEE - COMPLETE 4+ VIEW COMPARISON:  10/09/2018 FINDINGS: No acute displaced fracture or malalignment. Mild mediolateral joint space degenerative change. Joint space calcifications. Vascular calcification. No significant knee effusion. IMPRESSION: 1. No acute osseous abnormality. 2. Chondrocalcinosis.  Mild degenerative changes. Electronically Signed   By: Donavan Foil M.D.   On: 10/20/2018 20:04   Dg Knee Complete 4 Views Right  Result Date: 10/20/2018 CLINICAL DATA:  Fall EXAM: RIGHT KNEE - COMPLETE 4+ VIEW COMPARISON:  None. FINDINGS: No acute displaced fracture or malalignment. Moderate arthritis of the lateral compartment. Trace knee effusion. Vascular calcifications. IMPRESSION: 1. No definite acute osseous abnormality.  Trace knee effusion. Electronically Signed   By: Donavan Foil M.D.   On: 10/20/2018 20:03    Orson Eva, DO  [facility] Pager [phone]  If 7PM-7AM, please contact night-coverage [email or web address] Password [facility] 10/25/2018, 12:43 PM   LOS: 0 days

## 2018-10-25 NOTE — Progress Notes (Signed)
Subjective: No rectal bleeding. Chronic RLQ pain "for years". No LLQ pain. Ate breakfast without difficulty. Feels "creepy crawlies" all over her. Restless legs. States this happens at home and resolves if getting in the hot tub. Feels anxious.   Objective: Vital signs in last 24 hours: Temp:  [98.2 F (36.8 C)-98.4 F (36.9 C)] 98.3 F (36.8 C) (06/22 0515) Pulse Rate:  [70-80] 76 (06/22 0515) Resp:  [17] 17 (06/22 0515) BP: (109-144)/(54-92) 141/59 (06/22 0515) SpO2:  [94 %-100 %] 100 % (06/22 0515) Weight:  [64.5 kg] 64.5 kg (06/22 0515) Last BM Date: 10/25/18 General:   Alert and oriented, anxious Head:  Normocephalic and atraumatic. Abdomen:  Bowel sounds present, soft, full but non-distended. No TTP, rebound, or guarding Extremities:  With LLE wound laterally, eschar in center of wound, erythema to mid lower leg, previously treated for cellulitis during prior hospitalization. Pedal edema bilaterally Neurologic:  Alert and  oriented x4, anxious  Intake/Output from previous day: 06/21 0701 - 06/22 0700 In: 1525.8 [P.O.:720; I.V.:805.8] Out: 1202 [Urine:1200; Stool:2] Intake/Output this shift: No intake/output data recorded.  Lab Results: Recent Labs    10/23/18 2248 10/24/18 1153 10/25/18 0528  WBC 7.8 5.8 7.9  HGB 9.8* 9.5* 9.5*  HCT 30.1* 31.1* 29.7*  PLT 204 187 200   BMET Recent Labs    10/23/18 1813 10/24/18 1153 10/25/18 0528  NA 137 131* 138  K 3.6 3.9 4.2  CL 102 102 106  CO2 24 18* 21*  GLUCOSE 94 117* 91  BUN 13 10 12   CREATININE 1.48* 1.36* 1.38*  CALCIUM 8.1* 7.7* 8.2*   LFT Recent Labs    10/23/18 1813 10/24/18 1153  PROT 6.2* 6.2*  ALBUMIN 3.5 3.4*  AST 26 27  ALT 14 10  ALKPHOS 51 49  BILITOT 0.4 0.8     Studies/Results: Ct Abdomen Pelvis Wo Contrast  Result Date: 10/23/2018 CLINICAL DATA:  Recurrent rectal bleeding. EXAM: CT ABDOMEN AND PELVIS WITHOUT CONTRAST TECHNIQUE: Multidetector CT imaging of the abdomen and pelvis  was performed following the standard protocol without IV contrast. COMPARISON:  10/12/2018 FINDINGS: Lower chest: Enlarged heart. Calcific atherosclerotic disease of the coronary arteries and aorta. Partially visualized cardiac pacemaker leads. Hiatal hernia. Hepatobiliary: Hepatic cysts noted. Status post cholecystectomy. No biliary dilatation. Pancreas: Unremarkable. No pancreatic ductal dilatation or surrounding inflammatory changes. Spleen: Normal in size without focal abnormality. Adrenals/Urinary Tract: Adrenal glands are unremarkable. Kidneys are without focal lesion, or hydronephrosis. Punctate right nephrolithiasis, stable. Bladder is unremarkable. Stomach/Bowel: Stomach is within normal limits. No evidence of appendicitis. No evidence of small bowel wall thickening, distention, or inflammatory changes. Extensive left colonic diverticulosis. Mild diverticulitis at the level of the proximal descending colon and mid sigmoid colon. No evidence of rupture or abscess formation. Vascular/Lymphatic: Aortic atherosclerosis. No enlarged abdominal or pelvic lymph nodes. Reproductive: Status post hysterectomy. No adnexal masses. Other: No abdominopelvic ascites. Musculoskeletal: Osteopenia, scoliosis and diffuse spondylosis of the spine. IMPRESSION: 1. Extensive left colonic diverticulosis with mild diverticulitis at the level of the proximal descending colon and mid sigmoid colon. No evidence of rupture or abscess formation. 2. Punctate right nephrolithiasis. 3. Enlarged heart. 4. Calcific atherosclerotic disease of the coronary arteries and aorta. Electronically Signed   By: Fidela Salisbury M.D.   On: 10/23/2018 20:06    Assessment: 85 year old female admitted with rectal bleeding and abdominal pain in the setting of Eliquis and NSAIDs, recently inpatient June 9-15th for diverticulitis and rectal bleeding. Last colonoscopy  in Nov 2018.  Eliquis on hold, and Hgb remaining stable. Differentials include  diverticular, AVMs, hemorrhoids. CT abd/pelvis without contrast this admission with mild diverticulitis, with prior CT also during last admission noting same. Clinically improved s/p course of antibiotics previously. RLQ pain is chronic and clinically has improved since initially treated with course of antibiotics for diverticulitis. Rectal bleeding has resolved.   LLE erythema: previously treated for cellulitis during last admission and was discharged on oral omnicef. Unclear if this is improved from prior hospitalization and remains persistent. Further management per hospitalist.   Continue to hold Eliquis. Need to consider benefits vs risks of continuing. Previously inpatient 2018 with rectal bleeding as well. Last colonoscopy in 2018.    Plan: Monitor for recurrent abdominal pain Continue to hold Eliquis Anusol BID Continue dysphagia 3 diet Continue PPI BID Continue supportive care Consider outpatient early interval colonoscopy as last was over one year ago for direct visualization of colon.   Annitta Needs, PhD, ANP-BC [facility]      LOS: 0 days    10/25/2018, 8:14 AM

## 2018-10-25 NOTE — Evaluation (Signed)
Physical Therapy Evaluation Patient Details Name: Kimberly [name] MRN: [id number] DOB: 01/14/1927 Today's Date: 10/25/2018   History of Present Illness  Kimberly [name]  is a 85 y.o. female,w bipolar disorder, hypertension, ckd stage 3, CHF (EF 60-65% ), Erosive esophagitis, PUD, h/o diverticulitis w recent admission 6/9 for rectal bleeding, apparently c/o rectal bleeding over the past 2 days.  Pt states that she has mild right lower abdominal discomfort which is chronic.  Pt admits to drinking a glass of wine per day as well as using ibuprofen x2 in the past several days.  She has been taking her Eliquis.  Pt denies other aspirin or Nsaid use.  Pt denies fever, chills, cough, cp, palp, sob, n/v, diarrhea, black stool.  Pt presented to ED due to rectal bleeding.    Clinical Impression  Patient limited for functional mobility as stated below secondary to BLE weakness, fatigue and poor standing balance.  Patient transferred to [facility] demonstrating slow labored movement using armrest of BSC, slightly unsteady during gait training with 1 near loss of balance and limited secondary to c/o fatigue.  Patient tolerated sitting up at bedside after therapy.   Patient will benefit from continued physical therapy in hospital and recommended venue below to increase strength, balance, endurance for safe ADLs and gait.     Follow Up Recommendations SNF    Equipment Recommendations  None recommended by PT    Recommendations for Other Services       Precautions / Restrictions Precautions Precautions: Fall Restrictions Weight Bearing Restrictions: No      Mobility  Bed Mobility Overal bed mobility: Needs Assistance Bed Mobility: Supine to Sit     Supine to sit: Supervision     General bed mobility comments: increased time, head of bed raised  Transfers Overall transfer level: Needs assistance Equipment used: Rolling walker (2 wheeled) Transfers: Sit to/from [facility] Sit to Stand:  Min assist Stand pivot transfers: Min assist       General transfer comment: increased time, labored movement  Ambulation/Gait Ambulation/Gait assistance: Min assist Gait Distance (Feet): 45 Feet Assistive device: Rolling walker (2 wheeled) Gait Pattern/deviations: Decreased step length - right;Decreased step length - left;Decreased stride length Gait velocity: decreased   General Gait Details: slow labored cadence with occasional stumbles without loss of balance, limited secondary to c/o fatigue  Stairs            Wheelchair Mobility    Modified Rankin (Stroke Patients Only)       Balance Overall balance assessment: Needs assistance Sitting-balance support: Feet supported;No upper extremity supported Sitting balance-Leahy Scale: Good     Standing balance support: During functional activity;No upper extremity supported Standing balance-Leahy Scale: Poor Standing balance comment: fair using RW                             Pertinent Vitals/Pain Pain Assessment: Faces Faces Pain Scale: Hurts a little bit Pain Location: bilateral lower legs/feet Pain Descriptors / Indicators: Sore Pain Intervention(s): Limited activity within patient's tolerance;Monitored during session    Home Living Family/patient expects to be discharged to:: Private residence Living Arrangements: Alone Available Help at Discharge: Neighbor Type of Home: House Home Access: Stairs to enter Entrance Stairs-Rails: None Entrance Stairs-Number of Steps: 1 Home Layout: One level Home Equipment: Environmental consultant - 2 wheels;Bedside commode      Prior Function Level of Independence: Independent with assistive device(s)  Comments: household and short distanced community ambulator with RW     Hand Dominance        Extremity/Trunk Assessment   Upper Extremity Assessment Upper Extremity Assessment: Generalized weakness    Lower Extremity Assessment Lower Extremity Assessment:  Generalized weakness    Cervical / Trunk Assessment Cervical / Trunk Assessment: Kyphotic  Communication   Communication: No difficulties  Cognition Arousal/Alertness: Awake/alert Behavior During Therapy: WFL for tasks assessed/performed Overall Cognitive Status: Within Functional Limits for tasks assessed                                        General Comments      Exercises     Assessment/Plan    PT Assessment Patient needs continued PT services  PT Problem List Decreased strength;Decreased mobility;Decreased activity tolerance;Decreased balance       PT Treatment Interventions DME instruction;Gait training;Stair training;Functional mobility training;Therapeutic activities;Therapeutic exercise;Balance training;Neuromuscular re-education;Patient/family education    PT Goals (Current goals can be found in the Care Plan section)  Acute Rehab PT Goals Patient Stated Goal: return home PT Goal Formulation: With patient Time For Goal Achievement: 11/08/18 Potential to Achieve Goals: Good    Frequency Min 3X/week   Barriers to discharge        Co-evaluation               AM-PAC PT "6 Clicks" Mobility  Outcome Measure Help needed turning from your back to your side while in a flat bed without using bedrails?: None Help needed moving from lying on your back to sitting on the side of a flat bed without using bedrails?: A Little Help needed moving to and from a bed to a chair (including a wheelchair)?: A Little Help needed standing up from a chair using your arms (e.g., wheelchair or bedside chair)?: A Little Help needed to walk in hospital room?: A Little Help needed climbing 3-5 steps with a railing? : A Lot 6 Click Score: 18    End of Session   Activity Tolerance: Patient tolerated treatment well;Patient limited by fatigue Patient left: with call bell/phone within reach;in bed Nurse Communication: Mobility status PT Visit Diagnosis:  Unsteadiness on feet (R26.81);Other abnormalities of gait and mobility (R26.89);Muscle weakness (generalized) (M62.81)    Time: 0300-9233 PT Time Calculation (min) (ACUTE ONLY): 35 min   Charges:   PT Evaluation $PT Eval Moderate Complexity: 1 Mod PT Treatments $Therapeutic Activity: 23-37 mins        4:04 PM, 10/25/18 Lonell Grandchild, MPT Physical Therapist with [facility] 336 [phone] office [phone] mobile phone

## 2018-10-25 NOTE — NC FL2 (Signed)
[place] LEVEL OF CARE SCREENING TOOL     IDENTIFICATION  Patient Name: Kimberly [name] Birthdate: 05-30-1926 Sex: female Admission Date (Current Location): 10/23/2018  [facility] and [place] Number:  [employer] and Address:  [place] [address], [place]      Provider Number: [id number]  Attending Physician Name and Address:  Orson Eva, MD  Relative Name and Phone Number:       Current Level of Care: Other (Comment)(Observation) Recommended Level of Care: [place] Prior Approval Number:    Date Approved/Denied:   PASRR Number: [id number]  Discharge Plan: SNF    Current Diagnoses: Patient Active Problem List   Diagnosis Date Noted  . Rectal bleeding 10/23/2018  . Acute GI bleeding   . Enteritis of small intestine due to enterotoxigenic Escherichia coli associated with diarrhea 03/24/2017  . Bipolar affective disorder ([place]) 03/23/2017  . Olfactory hallucinations 03/23/2017  . Lower GI bleed   . Glaucoma 02/28/2017  . Pacemaker 03/21/2015  . Non-ischemic cardiomyopathy ([place]) 11/28/2014  . PUD (peptic ulcer disease) 06/14/2014  . History of bipolar disorder   . Hypokalemia 05/26/2014  . Fall at home 05/26/2014  . Chronic diastolic heart failure ([place]) 11/15/2013  . Complete heart block ([place]) 11/15/2013  . Inability to ambulate due to ankle or foot 09/05/2013  . Unspecified constipation 08/26/2013  . Anemia 08/25/2013  . Acute diverticulitis 08/24/2013  . CKD (chronic kidney disease) stage 3, GFR 30-59 ml/min (HCC) 10/12/2012  . Chronic anticoagulation 10/12/2012  . Osteoarthritis   . Permanent atrial fibrillation   . Biventricular cardiac pacemaker - [employer] Consulta   . Fibromyalgia   . Essential hypertension   . Depression     Orientation RESPIRATION BLADDER Height & Weight     Self, Time, Situation, Place  Normal Incontinent Weight: 142 lb 3.2 oz (64.5 kg) Height:  5\' 7"  (170.2 cm)   BEHAVIORAL SYMPTOMS/MOOD NEUROLOGICAL BOWEL NUTRITION STATUS      Continent Diet(DYS 3. Low fat diet.)  AMBULATORY STATUS COMMUNICATION OF NEEDS Skin   Limited Assist Verbally Normal                       Personal Care Assistance Level of Assistance  Bathing, Dressing Bathing Assistance: Limited assistance   Dressing Assistance: Independent     Functional Limitations Info  Sight, Hearing, Speech Sight Info: Adequate Hearing Info: Adequate Speech Info: Adequate    SPECIAL CARE FACTORS FREQUENCY  PT (By licensed PT)     PT Frequency: 5x/week              Contractures Contractures Info: Not present    Additional Factors Info  Code Status, Allergies, Psychotropic Code Status Info: Full Code Allergies Info: Ciprofloxacin, Flagyl, Iron, Papaya Derivatives, Iodine, Penicillins, Sulfa Antibiotics Psychotropic Info: Seroquel, Desyrel         Current Medications (10/25/2018):  This is the current hospital active medication list Current Facility-Administered Medications  Medication Dose Route Frequency Provider Last Rate Last Dose  . 0.9 % NaCl with KCl 20 mEq/ L  infusion   Intravenous Continuous Tat, Shanon Brow, MD 50 mL/hr at 10/25/18 0521    . acetaminophen (TYLENOL) tablet 650 mg  650 mg Oral Q6H PRN Jani Gravel, MD       Or  . acetaminophen (TYLENOL) suppository 650 mg  650 mg Rectal Q6H PRN Jani Gravel, MD      . carvedilol (COREG) tablet 6.25 mg  6.25 mg Oral BID WC Jani Gravel, MD   6.25 mg at 10/25/18 0719  . HYDROcodone-acetaminophen (NORCO/VICODIN) 5-325 MG per tablet 1-2 tablet  1-2 tablet Oral Q6H PRN Vertis Kelch, NP   2 tablet at 10/25/18 1154  . magnesium sulfate IVPB 2 g 50 mL  2 g Intravenous Once Tat, David, MD      . multivitamin-lutein (OCUVITE-LUTEIN) capsule 1 capsule  1 capsule Oral Daily Tat, David, MD   1 capsule at 10/25/18 0809  . ondansetron (ZOFRAN) injection 4 mg  4 mg Intravenous Q6H PRN Jani Gravel, MD      . pantoprazole (PROTONIX)  EC tablet 40 mg  40 mg Oral BID AC Fields, Sandi L, MD   40 mg at 10/25/18 0720  . polyvinyl alcohol (LIQUIFILM TEARS) 1.4 % ophthalmic solution 1-2 drop  1-2 drop Both Eyes BID PRN Tat, David, MD      . QUEtiapine (SEROQUEL) tablet 25 mg  25 mg Oral BID Jani Gravel, MD   25 mg at 10/25/18 0810  . sodium chloride (OCEAN) 0.65 % nasal spray 1 spray  1 spray Each Nare BID PRN Jani Gravel, MD      . traMADol Veatrice Bourbon) tablet 50 mg  50 mg Oral Q12H PRN Orson Eva, MD   50 mg at 10/25/18 0809  . traZODone (DESYREL) tablet 150 mg  150 mg Oral QHS Jani Gravel, MD   150 mg at 10/24/18 2158     Discharge Medications: Please see discharge summary for a list of discharge medications.  Relevant Imaging Results:  Relevant Lab Results:   Additional Information SSN [id number] Clydene Pugh, [name]

## 2018-10-26 ENCOUNTER — Telehealth: Payer: Self-pay | Admitting: Family Medicine

## 2018-10-26 ENCOUNTER — Encounter: Payer: Self-pay | Admitting: Internal Medicine

## 2018-10-26 ENCOUNTER — Telehealth: Payer: Self-pay | Admitting: Gastroenterology

## 2018-10-26 DIAGNOSIS — D62 Acute posthemorrhagic anemia: Secondary | ICD-10-CM

## 2018-10-26 LAB — CBC
HCT: 27.6 % — ABNORMAL LOW (ref 36.0–46.0)
Hemoglobin: 8.7 g/dL — ABNORMAL LOW (ref 12.0–15.0)
MCH: 30.6 pg (ref 26.0–34.0)
MCHC: 31.5 g/dL (ref 30.0–36.0)
MCV: 97.2 fL (ref 80.0–100.0)
Platelets: 190 10*3/uL (ref 150–400)
RBC: 2.84 MIL/uL — ABNORMAL LOW (ref 3.87–5.11)
RDW: 20.7 % — ABNORMAL HIGH (ref 11.5–15.5)
WBC: 4.5 10*3/uL (ref 4.0–10.5)
nRBC: 0 % (ref 0.0–0.2)

## 2018-10-26 LAB — BASIC METABOLIC PANEL
Anion gap: 6 (ref 5–15)
BUN: 16 mg/dL (ref 8–23)
CO2: 22 mmol/L (ref 22–32)
Calcium: 8.1 mg/dL — ABNORMAL LOW (ref 8.9–10.3)
Chloride: 113 mmol/L — ABNORMAL HIGH (ref 98–111)
Creatinine, Ser: 1.26 mg/dL — ABNORMAL HIGH (ref 0.44–1.00)
GFR calc Af Amer: 44 mL/min — ABNORMAL LOW (ref >60)
GFR calc non Af Amer: 38 mL/min — ABNORMAL LOW (ref >60)
Glucose, Bld: 86 mg/dL (ref 70–99)
Potassium: 4.3 mmol/L (ref 3.5–5.1)
Sodium: 141 mmol/L (ref 135–145)

## 2018-10-26 LAB — MAGNESIUM: Magnesium: 2.2 mg/dL (ref 1.7–2.4)

## 2018-10-26 MED ORDER — APIXABAN 2.5 MG PO TABS: 2.5000 mg | ORAL_TABLET | Freq: Two times a day (BID) | ORAL | 1 refills | Status: DC

## 2018-10-26 MED ORDER — TRAMADOL HCL 50 MG PO TABS: 50.0000 mg | ORAL_TABLET | Freq: Three times a day (TID) | ORAL | 0 refills | Status: DC | PRN

## 2018-10-26 MED ORDER — TRAMADOL HCL 50 MG PO TABS: 50.0000 mg | ORAL_TABLET | Freq: Two times a day (BID) | ORAL | Status: DC | PRN

## 2018-10-26 MED ORDER — HYDROCODONE-ACETAMINOPHEN 5-325 MG PO TABS: 1.0000 | ORAL_TABLET | Freq: Four times a day (QID) | ORAL | Status: DC | PRN

## 2018-10-26 NOTE — Telephone Encounter (Signed)
Please arrange routine office visit for hospital follow-up in 2 months.

## 2018-10-26 NOTE — Discharge Summary (Signed)
Physician Discharge Summary  Kimberly [name] TFT:[id number] DOB: 07/10/1926 DOA: 10/23/2018  PCP: [name], DO  Admit date: 10/23/2018 Discharge date: 10/26/2018  Admitted From: Home Disposition:  SNF  Recommendations for Outpatient Follow-up:  1. Follow up with PCP in 1-2 weeks 2. Please obtain BMP/CBC in one week     Discharge Condition: Stable CODE STATUS: FULL Diet recommendation: soft   Brief/Interim Summary: 85 y.o.femalewith medical history significant forbipolar disorder, atrial fibrillation on Eliquis, chronic kidney disease stage III, anxiety, chronic diastolic CHF, and hypertension, now presentingwith hematochezia and right lower quadrant abdominal pain. The patient was recently mated to the hospital from 10/13/2018 through 10/18/2018 for acute diverticulitis. She was discharged home with cefdinir and metronidazole. She stated that at the time of discharge, she did not have any hematochezia. However she began noticing hematochezia again on 10/20/2018. It has become more frequent and increasing in volume. As result, the patient presented for further evaluation. The patient states that during this past week she has taken approximately 4 tablets of ibuprofen. She continues to have right lower quadrant abdominal pain which is about the same as at the time she was discharged on 10/18/2018. She has had some nausea without any vomiting or frank diarrhea. There is been no fevers, chills, chest pain, shortness breath, coughing, hemoptysis. The patient was instructed to hold her apixaban until 10/20/2018 with which she complied. In the emergency department, the patient was afebrile hemodynamically stable saturating 100% room air. Hemoglobin was 9.5. Patient had a hemoglobin of 10.5 on 10/17/2018.  Discharge Diagnoses:  Diverticulitis/Hematochezia -GI Consult appreciated -pt had drop in Hgb of 1 gram with hematochezia and requires close monitoring for further blood  loss anemia -Started ceftriaxone and metronidazole-->discontinue per GI and observe clinically as pt finished appropriate course from her last hospitalization and radiographic changes lag behind clinical improvement -pt remained stable without any worsening abd pain off antibiotics and tolerated her diet -no further hematochezia during hospitalization -Monitor serial hemoglobin--stable since initial drop -Hgb 8.7 at time of d/c--mild trend down due to dilution from IVF -Judicious pain control -10/23/2018 CT abdomen and pelvis--mild diverticulitis in the descending colon and mid sigmoid. -colonoscopy in 2018:Patient had five 4 to 8 mm polyps in the rectum, sigmoid colon and proximal transverse colon, ascending colon, which were removed with a hot snare. Diverticulosis in the rectosigmoid colon and sigmoid colon. Internal hemorrhoids. -advanced diet to soft diet which the patient tolerated  PermanentAtrial fibrillation -CHADSVASC 5 (age, gender, CHF and hypertension) -Eliquis currently held given GI bleed -Continue Coreg with holding parameters -restart apixaban on 11/01/18 which would be 8 days on hold -if bleeds again, will need to discuss risk/benefits of continuation of apixaban  CKD stage III -Baseline creatinine 1.1-1.4 -serum creatinine 1.26 at time of d/c  Chronic diastolic CHF -Currently appears to be compensated and euvolemic. She does have mild lower extremity swelling which has been there for months. -holding lasix temporarily -Monitor intake and output, daily weights  Bipolar disorder -Appears to be calm and cooperative -Continue Seroquel, trazodone  Essential hypertension -continue coreg -Lasix heldtemporarily--restart after d/c  Left lower extremity erythema -Patient states that this has been chronic and unchanged -Monitor clinically -erythema has improved without antibiotics during hospitalization--therefore it is likely venous stasis  dermatitis       Discharge Instructions   Allergies as of 10/26/2018      Reactions   Ciprofloxacin Other (See Comments)   Possibly caused diarrhea November 2018   Flagyl [metronidazole] Other (See Comments)  Possibly caused diarrhea November 2018   Iron Swelling   Ferrous Sulfate - tongue swelling    Papaya Derivatives Hives   Iodine Rash, Other (See Comments)   REACTION:If injected,  Rash/irritated skin reaction "welts"   Penicillins Hives   DID THE REACTION INVOLVE: Swelling of the face/tongue/throat, SOB, or low BP? Unknown Sudden or severe rash/hives, skin peeling, or the inside of the mouth or nose? Yes Did it require medical treatment? Yes When did it last happen?19 or 85 years old If all above answers are NO, may proceed with cephalosporin use.   Sulfa Antibiotics Rash      Medication List    STOP taking these medications   cefdinir 300 MG capsule Commonly known as: OMNICEF   metroNIDAZOLE 500 MG tablet Commonly known as: Flagyl   oxyCODONE-acetaminophen 7.5-325 MG tablet Commonly known as: Percocet     TAKE these medications   acetaminophen 325 MG tablet Commonly known as: TYLENOL Take 2 tablets (650 mg total) by mouth every 6 (six) hours as needed for mild pain, fever or headache (or Fever >/= 101).   apixaban 2.5 MG Tabs tablet Commonly known as: Eliquis Take 1 tablet (2.5 mg total) by mouth 2 (two) times daily. Restart on 11/01/18 Start taking on: November 01, 2018 What changed:   additional instructions  These instructions start on November 01, 2018. If you are unsure what to do until then, ask your doctor or other care provider.   carvedilol 6.25 MG tablet Commonly known as: COREG Take 1 tablet (6.25 mg total) by mouth 2 (two) times daily with a meal.   furosemide 20 MG tablet Commonly known as: LASIX Take 1 tablet (20 mg total) by mouth daily.   omeprazole 20 MG tablet Commonly known as: PRILOSEC OTC Take 1 tablet (20 mg total) by  mouth daily.   ondansetron 4 MG tablet Commonly known as: ZOFRAN Take 1 tablet (4 mg total) by mouth every 6 (six) hours as needed for nausea.   potassium chloride 10 MEQ tablet Commonly known as: K-DUR Take 1 tablet (10 mEq total) by mouth daily.   PreserVision AREDS 2 Caps Take 1 capsule by mouth 2 (two) times daily.   QUEtiapine 25 MG tablet Commonly known as: SEROQUEL Take 1 tablet (25 mg total) by mouth 2 (two) times daily.   sodium chloride 0.65 % Soln nasal spray Commonly known as: OCEAN Place 1 spray 2 (two) times daily as needed into both nostrils for congestion.   Systane Ultra 0.4-0.3 % Soln Generic drug: Polyethyl Glycol-Propyl Glycol Place 1-2 drops 2 (two) times daily as needed into both eyes (dry eyes).   traMADol 50 MG tablet Commonly known as: Ultram Take 1 tablet (50 mg total) by mouth every 8 (eight) hours as needed for moderate pain or severe pain. What changed:   when to take this  reasons to take this   traZODone 150 MG tablet Commonly known as: DESYREL Take 1 tablet (150 mg total) by mouth at bedtime.       Allergies  Allergen Reactions   Ciprofloxacin Other (See Comments)    Possibly caused diarrhea November 2018   Flagyl [Metronidazole] Other (See Comments)    Possibly caused diarrhea November 2018   Iron Swelling    Ferrous Sulfate - tongue swelling    Papaya Derivatives Hives   Iodine Rash and Other (See Comments)    REACTION:If injected,  Rash/irritated skin reaction "welts"   Penicillins Hives    DID THE REACTION INVOLVE:  Swelling of the face/tongue/throat, SOB, or low BP? Unknown Sudden or severe rash/hives, skin peeling, or the inside of the mouth or nose? Yes Did it require medical treatment? Yes When did it last happen?28 or 85 years old If all above answers are NO, may proceed with cephalosporin use.   Sulfa Antibiotics Rash    Consultations:  [facility]   Procedures/Studies: Ct Abdomen Pelvis Wo  Contrast  Result Date: 10/23/2018 CLINICAL DATA:  Recurrent rectal bleeding. EXAM: CT ABDOMEN AND PELVIS WITHOUT CONTRAST TECHNIQUE: Multidetector CT imaging of the abdomen and pelvis was performed following the standard protocol without IV contrast. COMPARISON:  10/12/2018 FINDINGS: Lower chest: Enlarged heart. Calcific atherosclerotic disease of the coronary arteries and aorta. Partially visualized cardiac pacemaker leads. Hiatal hernia. Hepatobiliary: Hepatic cysts noted. Status post cholecystectomy. No biliary dilatation. Pancreas: Unremarkable. No pancreatic ductal dilatation or surrounding inflammatory changes. Spleen: Normal in size without focal abnormality. Adrenals/Urinary Tract: Adrenal glands are unremarkable. Kidneys are without focal lesion, or hydronephrosis. Punctate right nephrolithiasis, stable. Bladder is unremarkable. Stomach/Bowel: Stomach is within normal limits. No evidence of appendicitis. No evidence of small bowel wall thickening, distention, or inflammatory changes. Extensive left colonic diverticulosis. Mild diverticulitis at the level of the proximal descending colon and mid sigmoid colon. No evidence of rupture or abscess formation. Vascular/Lymphatic: Aortic atherosclerosis. No enlarged abdominal or pelvic lymph nodes. Reproductive: Status post hysterectomy. No adnexal masses. Other: No abdominopelvic ascites. Musculoskeletal: Osteopenia, scoliosis and diffuse spondylosis of the spine. IMPRESSION: 1. Extensive left colonic diverticulosis with mild diverticulitis at the level of the proximal descending colon and mid sigmoid colon. No evidence of rupture or abscess formation. 2. Punctate right nephrolithiasis. 3. Enlarged heart. 4. Calcific atherosclerotic disease of the coronary arteries and aorta. Electronically Signed   By: Fidela Salisbury M.D.   On: 10/23/2018 20:06   Ct Abdomen Pelvis Wo Contrast  Result Date: 10/12/2018 CLINICAL DATA:  85 year old female with abdominal  pain for 3-4 days, rectal bleeding for 2-3 weeks. EXAM: CT ABDOMEN AND PELVIS WITHOUT CONTRAST TECHNIQUE: Multidetector CT imaging of the abdomen and pelvis was performed following the standard protocol without IV contrast. COMPARISON:  CT Abdomen and [facility] 04/07/2018, and earlier. FINDINGS: Lower chest: Chronic cardiomegaly. Cardiac pacemaker leads. Chronic mild lung base scarring. No pericardial or pleural effusion. Hepatobiliary: Surgically absent gallbladder. Stable noncontrast liver with several small benign cysts or hemangiomas. Pancreas: Negative. Spleen: Negative. Adrenals/Urinary Tract: Normal adrenal glands. Punctate right nephrolithiasis is stable. No hydronephrosis. Proximal ureters are decompressed. Mild bilateral perinephric stranding is chronic. Diminutive and unremarkable urinary bladder. Stomach/Bowel: Negative rectum. Mild mesenteric stranding about the sigmoid colon with severe underlying diverticulosis (series 2, image 60). Indistinct appearance of the sigmoid in this region. No extraluminal gas or fluid. Diverticulosis continues in the descending colon with a 2nd area of questionable mild mesenteric inflammation on series 2, image 29. Redundant transverse colon also with diverticulosis, no active inflammation. No diverticulosis in the right colon. Negative terminal ileum. No dilated small bowel. Small to moderate gastric hiatal hernia is stable. No free air, free fluid. Vascular/Lymphatic: Extensive Aortoiliac calcified atherosclerosis. Vascular patency is not evaluated in the absence of IV contrast. No lymphadenopathy. Reproductive: Surgically absent. Other: No pelvic free fluid. Musculoskeletal: Osteopenia, scoliosis, spine degeneration. No acute osseous abnormality identified. IMPRESSION: 1. Severe diverticulosis of the descending and sigmoid colon. Mild Acute Diverticulitis in the mid sigmoid, and perhaps a 2nd site of active inflammation also in the mid descending  colon. No perforation, abscess or complicating features.  2. No other acute or inflammatory process in the abdomen or pelvis. 3.  Aortic Atherosclerosis (ICD10-I70.0). Electronically Signed   By: Genevie Ann M.D.   On: 10/12/2018 22:53   Dg Knee Complete 4 Views Left  Result Date: 10/20/2018 CLINICAL DATA:  Fall with pain and swelling EXAM: LEFT KNEE - COMPLETE 4+ VIEW COMPARISON:  10/09/2018 FINDINGS: No acute displaced fracture or malalignment. Mild mediolateral joint space degenerative change. Joint space calcifications. Vascular calcification. No significant knee effusion. IMPRESSION: 1. No acute osseous abnormality. 2. Chondrocalcinosis.  Mild degenerative changes. Electronically Signed   By: Donavan Foil M.D.   On: 10/20/2018 20:04   Dg Knee Complete 4 Views Right  Result Date: 10/20/2018 CLINICAL DATA:  Fall EXAM: RIGHT KNEE - COMPLETE 4+ VIEW COMPARISON:  None. FINDINGS: No acute displaced fracture or malalignment. Moderate arthritis of the lateral compartment. Trace knee effusion. Vascular calcifications. IMPRESSION: 1. No definite acute osseous abnormality.  Trace knee effusion. Electronically Signed   By: Donavan Foil M.D.   On: 10/20/2018 20:03         Discharge Exam: Vitals:   10/26/18 0630 10/26/18 0939  BP: 134/76   Pulse: 74   Resp: 20   Temp: 97.7 F (36.5 C)   SpO2: 100% 99%   Vitals:   10/25/18 1119 10/25/18 2100 10/26/18 0630 10/26/18 0939  BP: 119/63 126/64 134/76   Pulse: 75 75 74   Resp: (!) 22 20 20    Temp: 98.2 F (36.8 C) 97.8 F (36.6 C) 97.7 F (36.5 C)   TempSrc: Oral Oral Oral   SpO2: 99% 100% 100% 99%  Weight:   73.5 kg   Height:        General: Pt is alert, awake, not in acute distress Cardiovascular: IRRR, S1/S2 +, no rubs, no gallops Respiratory: CTA bilaterally, no wheezing, no rhonchi Abdominal: Soft, NT, ND, bowel sounds + Extremities: no edema, no cyanosis   The results of significant diagnostics from this hospitalization (including  imaging, microbiology, ancillary and laboratory) are listed below for reference.    Significant Diagnostic Studies: Ct Abdomen Pelvis Wo Contrast  Result Date: 10/23/2018 CLINICAL DATA:  Recurrent rectal bleeding. EXAM: CT ABDOMEN AND PELVIS WITHOUT CONTRAST TECHNIQUE: Multidetector CT imaging of the abdomen and pelvis was performed following the standard protocol without IV contrast. COMPARISON:  10/12/2018 FINDINGS: Lower chest: Enlarged heart. Calcific atherosclerotic disease of the coronary arteries and aorta. Partially visualized cardiac pacemaker leads. Hiatal hernia. Hepatobiliary: Hepatic cysts noted. Status post cholecystectomy. No biliary dilatation. Pancreas: Unremarkable. No pancreatic ductal dilatation or surrounding inflammatory changes. Spleen: Normal in size without focal abnormality. Adrenals/Urinary Tract: Adrenal glands are unremarkable. Kidneys are without focal lesion, or hydronephrosis. Punctate right nephrolithiasis, stable. Bladder is unremarkable. Stomach/Bowel: Stomach is within normal limits. No evidence of appendicitis. No evidence of small bowel wall thickening, distention, or inflammatory changes. Extensive left colonic diverticulosis. Mild diverticulitis at the level of the proximal descending colon and mid sigmoid colon. No evidence of rupture or abscess formation. Vascular/Lymphatic: Aortic atherosclerosis. No enlarged abdominal or pelvic lymph nodes. Reproductive: Status post hysterectomy. No adnexal masses. Other: No abdominopelvic ascites. Musculoskeletal: Osteopenia, scoliosis and diffuse spondylosis of the spine. IMPRESSION: 1. Extensive left colonic diverticulosis with mild diverticulitis at the level of the proximal descending colon and mid sigmoid colon. No evidence of rupture or abscess formation. 2. Punctate right nephrolithiasis. 3. Enlarged heart. 4. Calcific atherosclerotic disease of the coronary arteries and aorta. Electronically Signed   By: Linwood Dibbles.D.  On: 10/23/2018 20:06   Ct Abdomen Pelvis Wo Contrast  Result Date: 10/12/2018 CLINICAL DATA:  85 year old female with abdominal pain for 3-4 days, rectal bleeding for 2-3 weeks. EXAM: CT ABDOMEN AND PELVIS WITHOUT CONTRAST TECHNIQUE: Multidetector CT imaging of the abdomen and pelvis was performed following the standard protocol without IV contrast. COMPARISON:  CT Abdomen and [facility] 04/07/2018, and earlier. FINDINGS: Lower chest: Chronic cardiomegaly. Cardiac pacemaker leads. Chronic mild lung base scarring. No pericardial or pleural effusion. Hepatobiliary: Surgically absent gallbladder. Stable noncontrast liver with several small benign cysts or hemangiomas. Pancreas: Negative. Spleen: Negative. Adrenals/Urinary Tract: Normal adrenal glands. Punctate right nephrolithiasis is stable. No hydronephrosis. Proximal ureters are decompressed. Mild bilateral perinephric stranding is chronic. Diminutive and unremarkable urinary bladder. Stomach/Bowel: Negative rectum. Mild mesenteric stranding about the sigmoid colon with severe underlying diverticulosis (series 2, image 60). Indistinct appearance of the sigmoid in this region. No extraluminal gas or fluid. Diverticulosis continues in the descending colon with a 2nd area of questionable mild mesenteric inflammation on series 2, image 29. Redundant transverse colon also with diverticulosis, no active inflammation. No diverticulosis in the right colon. Negative terminal ileum. No dilated small bowel. Small to moderate gastric hiatal hernia is stable. No free air, free fluid. Vascular/Lymphatic: Extensive Aortoiliac calcified atherosclerosis. Vascular patency is not evaluated in the absence of IV contrast. No lymphadenopathy. Reproductive: Surgically absent. Other: No pelvic free fluid. Musculoskeletal: Osteopenia, scoliosis, spine degeneration. No acute osseous abnormality identified. IMPRESSION: 1. Severe diverticulosis of the descending  and sigmoid colon. Mild Acute Diverticulitis in the mid sigmoid, and perhaps a 2nd site of active inflammation also in the mid descending colon. No perforation, abscess or complicating features. 2. No other acute or inflammatory process in the abdomen or pelvis. 3.  Aortic Atherosclerosis (ICD10-I70.0). Electronically Signed   By: Genevie Ann M.D.   On: 10/12/2018 22:53   Dg Knee Complete 4 Views Left  Result Date: 10/20/2018 CLINICAL DATA:  Fall with pain and swelling EXAM: LEFT KNEE - COMPLETE 4+ VIEW COMPARISON:  10/09/2018 FINDINGS: No acute displaced fracture or malalignment. Mild mediolateral joint space degenerative change. Joint space calcifications. Vascular calcification. No significant knee effusion. IMPRESSION: 1. No acute osseous abnormality. 2. Chondrocalcinosis.  Mild degenerative changes. Electronically Signed   By: Donavan Foil M.D.   On: 10/20/2018 20:04   Dg Knee Complete 4 Views Right  Result Date: 10/20/2018 CLINICAL DATA:  Fall EXAM: RIGHT KNEE - COMPLETE 4+ VIEW COMPARISON:  None. FINDINGS: No acute displaced fracture or malalignment. Moderate arthritis of the lateral compartment. Trace knee effusion. Vascular calcifications. IMPRESSION: 1. No definite acute osseous abnormality.  Trace knee effusion. Electronically Signed   By: Donavan Foil M.D.   On: 10/20/2018 20:03     Microbiology: Recent Results (from the past 240 hour(s))  SARS Coronavirus 2 (CEPHEID - Performed in [facility] hospital lab), Hosp Order     Status: None   Collection Time: 10/23/18  8:30 PM   Specimen: Nasopharyngeal Swab  Result Value Ref Range Status   SARS Coronavirus 2 NEGATIVE NEGATIVE Final    Comment: (NOTE) If result is NEGATIVE SARS-CoV-2 target nucleic acids are NOT DETECTED. The SARS-CoV-2 RNA is generally detectable in upper and lower  respiratory specimens during the acute phase of infection. The lowest  concentration of SARS-CoV-2 viral copies this assay can detect is 250  copies / mL.  A negative result does not preclude SARS-CoV-2 infection  and should not be used as the sole basis for  treatment or other  patient management decisions.  A negative result may occur with  improper specimen collection / handling, submission of specimen other  than nasopharyngeal swab, presence of viral mutation(s) within the  areas targeted by this assay, and inadequate number of viral copies  (<250 copies / mL). A negative result must be combined with clinical  observations, patient history, and epidemiological information. If result is POSITIVE SARS-CoV-2 target nucleic acids are DETECTED. The SARS-CoV-2 RNA is generally detectable in upper and lower  respiratory specimens dur ing the acute phase of infection.  Positive  results are indicative of active infection with SARS-CoV-2.  Clinical  correlation with patient history and other diagnostic information is  necessary to determine patient infection status.  Positive results do  not rule out bacterial infection or co-infection with other viruses. If result is PRESUMPTIVE POSTIVE SARS-CoV-2 nucleic acids MAY BE PRESENT.   A presumptive positive result was obtained on the submitted specimen  and confirmed on repeat testing.  While 2019 novel coronavirus  (SARS-CoV-2) nucleic acids may be present in the submitted sample  additional confirmatory testing may be necessary for epidemiological  and / or clinical management purposes  to differentiate between  SARS-CoV-2 and other Sarbecovirus currently known to infect humans.  If clinically indicated additional testing with an alternate test  methodology [phone]) is advised. The SARS-CoV-2 RNA is generally  detectable in upper and lower respiratory sp ecimens during the acute  phase of infection. The expected result is Negative. Fact Sheet for Patients:  StrictlyIdeas.no Fact Sheet for Healthcare Providers: BankingDealers.co.za This test is not  yet approved or cleared by the [place] FDA and has been authorized for detection and/or diagnosis of SARS-CoV-2 by FDA under an Emergency Use Authorization (EUA).  This EUA will remain in effect (meaning this test can be used) for the duration of the COVID-19 declaration under Section 564(b)(1) of the Act, 21 U.S.C. section 360bbb-3(b)(1), unless the authorization is terminated or revoked sooner. Performed at [facility], [address], [place], [place] [address]      Labs: Basic Metabolic Panel: Recent Labs  Lab 10/23/18 1813 10/24/18 1153 10/25/18 0528 10/26/18 0514  NA 137 131* 138 141  K 3.6 3.9 4.2 4.3  CL 102 102 106 113*  CO2 24 18* 21* 22  GLUCOSE 94 117* 91 86  BUN 13 10 12 16   CREATININE 1.48* 1.36* 1.38* 1.26*  CALCIUM 8.1* 7.7* 8.2* 8.1*  MG  --  1.8 1.6* 2.2   Liver Function Tests: Recent Labs  Lab 10/23/18 1813 10/24/18 1153  AST 26 27  ALT 14 10  ALKPHOS 51 49  BILITOT 0.4 0.8  PROT 6.2* 6.2*  ALBUMIN 3.5 3.4*   Recent Labs  Lab 10/23/18 1813  LIPASE 45   No results for input(s): AMMONIA in the last 168 hours. CBC: Recent Labs  Lab 10/23/18 1813 10/23/18 2248 10/24/18 1153 10/25/18 0528 10/26/18 0514  WBC 6.6 7.8 5.8 7.9 4.5  NEUTROABS 4.7  --   --   --   --   HGB 9.5* 9.8* 9.5* 9.5* 8.7*  HCT 30.5* 30.1* 31.1* 29.7* 27.6*  MCV 97.8 96.2 99.7 97.1 97.2  PLT 199 204 187 200 190   Cardiac Enzymes: No results for input(s): CKTOTAL, CKMB, CKMBINDEX, TROPONINI in the last 168 hours. BNP: Invalid input(s): POCBNP CBG: No results for input(s): GLUCAP in the last 168 hours.  Time coordinating discharge:  36 minutes  Signed:  Orson Eva, DO [facility] Pager:  [phone] 10/26/2018, 12:19 PM

## 2018-10-26 NOTE — Progress Notes (Signed)
    Subjective: Feels improved today. In good spirits. Ate all of her fish and vegetables last night. Enjoying pancakes and sausage this morning. No overt GI bleeding. No N/V. RLQ pain present for years. Wonders if scar tissue. History of complicated appendicitis s/p surgery in past.   Objective: Vital signs in last 24 hours: Temp:  [97.7 F (36.5 C)-98.2 F (36.8 C)] 97.7 F (36.5 C) (06/23 0630) Pulse Rate:  [74-75] 74 (06/23 0630) Resp:  [20-22] 20 (06/23 0630) BP: (119-134)/(63-76) 134/76 (06/23 0630) SpO2:  [99 %-100 %] 100 % (06/23 0630) Weight:  [73.5 kg] 73.5 kg (06/23 0630) Last BM Date: 10/25/18 General:   Alert and oriented, pleasant Head:  Normocephalic and atraumatic.  Abdomen:  Bowel sounds present, soft, mild RLQ TTP, round but soft. No HSM. Extremities:  With LLE erythema, some improvement from yesterday. Bilateral pedal edema Neurologic:  Alert and  oriented x4  Intake/Output from previous day: 06/22 0701 - 06/23 0700 In: 1050 [P.O.:600; I.V.:400; IV Piggyback:50] Out: 860 [Urine:860] Intake/Output this shift: No intake/output data recorded.  Lab Results: Recent Labs    10/24/18 1153 10/25/18 0528 10/26/18 0514  WBC 5.8 7.9 4.5  HGB 9.5* 9.5* 8.7*  HCT 31.1* 29.7* 27.6*  PLT 187 200 190   BMET Recent Labs    10/24/18 1153 10/25/18 0528 10/26/18 0514  NA 131* 138 141  K 3.9 4.2 4.3  CL 102 106 113*  CO2 18* 21* 22  GLUCOSE 117* 91 86  BUN 10 12 16   CREATININE 1.36* 1.38* 1.26*  CALCIUM 7.7* 8.2* 8.1*   LFT Recent Labs    10/23/18 1813 10/24/18 1153  PROT 6.2* 6.2*  ALBUMIN 3.5 3.4*  AST 26 27  ALT 14 10  ALKPHOS 51 65  BILITOT 0.4 0.8    Assessment:  85 year old female admitted with rectal bleeding and abdominal pain in the setting of Eliquis and NSAIDs, recently inpatient June 9-15th for diverticulitis and rectal bleeding. Last colonoscopy  in Nov 2018. Eliquis on hold, and no further rectal bleeding. Differentials include  diverticular, AVMs, hemorrhoids. CT abd/pelvis without contrast this admission with mild diverticulitis, with prior CT also during last admission noting same. Clinically improved s/p course of antibiotics previously. RLQ pain is chronic and unchanged without alarm features; she has significantly improved from admission and specifically within the last 24 hours.    LLE erythema: previously treated for cellulitis during last admission and was discharged on oral omnicef. Erythema improved from yesterday. Further management per hospitalist.   Eliquis remains on hold. If resuming, absolutely avoid all NSAIDs.    Plan: Eliquis on hold: if resuming in future, absolutely avoid concomitant NSAIDs Anusol BID Continue current dysphagia 3 diet PPI BID Supportive care Will follow peripherally   Annitta Needs, PhD, ANP-BC [facility]      LOS: 1 day    10/26/2018, 7:56 AM

## 2018-10-26 NOTE — Telephone Encounter (Signed)
PATIENT SCHEDULED AND LETTER SENT  °

## 2018-10-27 ENCOUNTER — Other Ambulatory Visit: Payer: Self-pay | Admitting: *Deleted

## 2018-10-27 NOTE — Progress Notes (Signed)
Pt discharged via stretcher with EMS at 2210 in stable condition. IV to right hand d'cd, pressure and bandage applied, no bleeding noted. Educated on medications and when next doses were due.

## 2018-10-27 NOTE — Care Management Important Message (Signed)
Important Message  Patient Details  Name: Kimberly [name] MRN: [id number] Date of Birth: [birth year]   Medicare Important Message Given:  Yes     Tommy Medal 10/27/2018, 11:05 AM

## 2018-10-27 NOTE — Patient Outreach (Signed)
Pt hospitalized 6/20-6/23/20 and discharged to SNF , RN CM will close case per workflow.  RN CM spoke with embedded CSW Theadore Nan and he will continue to follow pt during her SNF stay and thereafter if pt discharges home. Pt also continues to be followed by [facility]  RN CM faxed case closure letter to primary MD and mailed case closure letter to pt home.  Case closed  Jacqlyn Larsen [facility], [place] Coordinator [phone]

## 2018-10-27 NOTE — Discharge Summary (Signed)
Physician Discharge Summary  Kimberly [name] RWE:[id number] DOB: 1926-10-26 DOA: 10/23/2018  PCP: [name], DO  Admit date: 10/23/2018 Discharge date: 10/27/2018  Time spent: 30 minutes  Recommendations for Outpatient Follow-up:  1. Repeat CBC to follow Hgb trend  2. Repeat BMEt to follow electrolytes and renal function  3. Resumption of anticoagulation on 11/01/18    Discharge Diagnoses:  Principal Problem:   Rectal bleeding Active Problems:   Permanent atrial fibrillation   Biventricular cardiac pacemaker - [employer] Consulta   Essential hypertension   CKD (chronic kidney disease) stage 3, GFR 30-59 ml/min (HCC)   Acute diverticulitis   Anemia   Acute blood loss anemia   Discharge Condition: stable and improved. Discharge home with home health services; patient refused SNF for rehabilitation as recommended by PT. Instructed to follow up with PCP in 1-2 weeks.   Diet recommendation: heart healthy diet   Filed Weights   10/24/18 0500 10/25/18 0515 10/26/18 0630  Weight: 63.4 kg 64.5 kg 73.5 kg    Brief interim/history of present illness:  85 y.o.femalewith medical history significant forbipolar disorder, atrial fibrillation on Eliquis, chronic kidney disease stage III, anxiety, chronic diastolic CHF, and hypertension, now presentingwith hematochezia and right lower quadrant abdominal pain. The patient was recently mated to the hospital from 10/13/2018 through 10/18/2018 for acute diverticulitis. She was discharged home with cefdinir and metronidazole. She stated that at the time of discharge, she did not have any hematochezia. However she began noticing hematochezia again on 10/20/2018. It has become more frequent and increasing in volume. As result, the patient presented for further evaluation. The patient states that during this past week she has taken approximately 4 tablets of ibuprofen. She continues to have right lower quadrant abdominal pain which is about  the same as at the time she was discharged on 10/18/2018. She has had some nausea without any vomiting or frank diarrhea. There is been no fevers, chills, chest pain, shortness breath, coughing, hemoptysis. The patient was instructed to hold her apixaban until 10/20/2018 with which she complied. In the emergency department, the patient was afebrile hemodynamically stable saturating 100% room air. Hemoglobin was 9.5. Patient had a hemoglobin of 10.5 on 10/17/2018.  Hospital Course:  Diverticulitis/Hematochezia -GI Consultappreciated -Startedceftriaxone and metronidazole-->discontinue per GI and observe clinically as pt finished appropriate course from her last hospitalization and radiographic changes lag behind clinical improvement. -pt remained stable without any worsening abd pain off antibiotics and tolerated her diet -no further hematochezia during hospitalization. -Repeat CBC to follow hemoglobin trend. -Hgb 8.7 at time of d/c--mild trend down due to dilution from IVF -Judicious pain control recommended. -10/23/2018 CT abdomen and pelvis--mild diverticulitis in the descending colon and mid sigmoid. -colonoscopy in 2018:Patient had five 4 to 8 mm polyps in the rectum, sigmoid colon and proximal transverse colon, ascending colon, which were removed with a hot snare. Diverticulosis in the rectosigmoid colon and sigmoid colon. Internal hemorrhoids. -advanced diet to soft diet which the patient tolerated without any problems.  PermanentAtrial fibrillation -CHADSVASC 5 (age, gender, CHF and hypertension) -Eliquis currently held given GI bleed -Continue Coreg with holding parameters -restart apixaban on 11/01/18 which would be 8 days on hold -if bleeds again, will need to discuss risk/benefits of continuation of apixaban  CKD stage III -Baseline creatinine 1.1-1.4 -serum creatinine 1.26 at time of d/c -Advised to maintain adequate hydration.  Chronic diastolic CHF -Currently  appears to be compensated and euvolemic.  -Continue to follow low-sodium diet, daily weights and resumption of  Lasix.  Bipolar disorder -Appears to be calm and cooperative -Continue Seroquel, trazodone  Essential hypertension -continue coreg and resume lasix -heart healthy diet recommended   Left lower extremity erythema -Patient states that this has been chronic and unchanged -continue to monitor and maintain leg elevated as much as possible; most likely venous stasis dermatitis   Procedures:  See below for x-ray reports   Consultations:  Gastroenterology   Discharge Exam: Vitals:   10/27/18 0732 10/27/18 1214  BP: (!) 160/81 (!) 169/78  Pulse: 76 82  Resp: 18 18  Temp: 98.8 F (37.1 C) 98.4 F (36.9 C)  SpO2: 100% 100%   General: Pt is alert, awake, not in acute distress.  Afebrile, denying nausea, vomiting, or further episode of hematochezia/melena. Cardiovascular: IRRR, S1/S2 +, no rubs, no gallops Respiratory: CTA bilaterally, no wheezing, no rhonchi Abdominal: Soft, NT, ND, bowel sounds + Extremities:  Trace to 1+ edema (chronic and unchanged), no cyanosis, no clubbing.   Discharge Instructions    Allergies as of 10/27/2018      Reactions   Ciprofloxacin Other (See Comments)   Possibly caused diarrhea November 2018   Flagyl [metronidazole] Other (See Comments)   Possibly caused diarrhea November 2018   Iron Swelling   Ferrous Sulfate - tongue swelling    Papaya Derivatives Hives   Iodine Rash, Other (See Comments)   REACTION:If injected,  Rash/irritated skin reaction "welts"   Penicillins Hives   DID THE REACTION INVOLVE: Swelling of the face/tongue/throat, SOB, or low BP? Unknown Sudden or severe rash/hives, skin peeling, or the inside of the mouth or nose? Yes Did it require medical treatment? Yes When did it last happen?60 or 85 years old If all above answers are "NO", may proceed with cephalosporin use.   Sulfa Antibiotics Rash       Medication List    STOP taking these medications   cefdinir 300 MG capsule Commonly known as: OMNICEF   metroNIDAZOLE 500 MG tablet Commonly known as: Flagyl   oxyCODONE-acetaminophen 7.5-325 MG tablet Commonly known as: Percocet     TAKE these medications   acetaminophen 325 MG tablet Commonly known as: TYLENOL Take 2 tablets (650 mg total) by mouth every 6 (six) hours as needed for mild pain, fever or headache (or Fever >/= 101).   apixaban 2.5 MG Tabs tablet Commonly known as: Eliquis Take 1 tablet (2.5 mg total) by mouth 2 (two) times daily. Restart on 11/01/18 Start taking on: November 01, 2018 What changed:   additional instructions  These instructions start on November 01, 2018. If you are unsure what to do until then, ask your doctor or other care provider.   carvedilol 6.25 MG tablet Commonly known as: COREG Take 1 tablet (6.25 mg total) by mouth 2 (two) times daily with a meal.   furosemide 20 MG tablet Commonly known as: LASIX Take 1 tablet (20 mg total) by mouth daily.   omeprazole 20 MG tablet Commonly known as: PRILOSEC OTC Take 1 tablet (20 mg total) by mouth daily.   ondansetron 4 MG tablet Commonly known as: ZOFRAN Take 1 tablet (4 mg total) by mouth every 6 (six) hours as needed for nausea.   potassium chloride 10 MEQ tablet Commonly known as: K-DUR Take 1 tablet (10 mEq total) by mouth daily.   PreserVision AREDS 2 Caps Take 1 capsule by mouth 2 (two) times daily.   QUEtiapine 25 MG tablet Commonly known as: SEROQUEL Take 1 tablet (25 mg total) by mouth 2 (two)  times daily.   sodium chloride 0.65 % Soln nasal spray Commonly known as: OCEAN Place 1 spray 2 (two) times daily as needed into both nostrils for congestion.   Systane Ultra 0.4-0.3 % Soln Generic drug: Polyethyl Glycol-Propyl Glycol Place 1-2 drops 2 (two) times daily as needed into both eyes (dry eyes).   traMADol 50 MG tablet Commonly known as: Ultram Take 1 tablet (50 mg total)  by mouth every 8 (eight) hours as needed for moderate pain or severe pain. What changed:   when to take this  reasons to take this   traZODone 150 MG tablet Commonly known as: DESYREL Take 1 tablet (150 mg total) by mouth at bedtime.      Allergies  Allergen Reactions  . Ciprofloxacin Other (See Comments)    Possibly caused diarrhea November 2018  . Flagyl [Metronidazole] Other (See Comments)    Possibly caused diarrhea November 2018  . Iron Swelling    Ferrous Sulfate - tongue swelling   . Papaya Derivatives Hives  . Iodine Rash and Other (See Comments)    REACTION:If injected,  Rash/irritated skin reaction "welts"  . Penicillins Hives    DID THE REACTION INVOLVE: Swelling of the face/tongue/throat, SOB, or low BP? Unknown Sudden or severe rash/hives, skin peeling, or the inside of the mouth or nose? Yes Did it require medical treatment? Yes When did it last happen?31 or 85 years old If all above answers are "NO", may proceed with cephalosporin use.  . Sulfa Antibiotics Rash    The results of significant diagnostics from this hospitalization (including imaging, microbiology, ancillary and laboratory) are listed below for reference.    Significant Diagnostic Studies: Ct Abdomen Pelvis Wo Contrast  Result Date: 10/23/2018 CLINICAL DATA:  Recurrent rectal bleeding. EXAM: CT ABDOMEN AND PELVIS WITHOUT CONTRAST TECHNIQUE: Multidetector CT imaging of the abdomen and pelvis was performed following the standard protocol without IV contrast. COMPARISON:  10/12/2018 FINDINGS: Lower chest: Enlarged heart. Calcific atherosclerotic disease of the coronary arteries and aorta. Partially visualized cardiac pacemaker leads. Hiatal hernia. Hepatobiliary: Hepatic cysts noted. Status post cholecystectomy. No biliary dilatation. Pancreas: Unremarkable. No pancreatic ductal dilatation or surrounding inflammatory changes. Spleen: Normal in size without focal abnormality. Adrenals/Urinary Tract:  Adrenal glands are unremarkable. Kidneys are without focal lesion, or hydronephrosis. Punctate right nephrolithiasis, stable. Bladder is unremarkable. Stomach/Bowel: Stomach is within normal limits. No evidence of appendicitis. No evidence of small bowel wall thickening, distention, or inflammatory changes. Extensive left colonic diverticulosis. Mild diverticulitis at the level of the proximal descending colon and mid sigmoid colon. No evidence of rupture or abscess formation. Vascular/Lymphatic: Aortic atherosclerosis. No enlarged abdominal or pelvic lymph nodes. Reproductive: Status post hysterectomy. No adnexal masses. Other: No abdominopelvic ascites. Musculoskeletal: Osteopenia, scoliosis and diffuse spondylosis of the spine. IMPRESSION: 1. Extensive left colonic diverticulosis with mild diverticulitis at the level of the proximal descending colon and mid sigmoid colon. No evidence of rupture or abscess formation. 2. Punctate right nephrolithiasis. 3. Enlarged heart. 4. Calcific atherosclerotic disease of the coronary arteries and aorta. Electronically Signed   By: Fidela Salisbury M.D.   On: 10/23/2018 20:06   Ct Abdomen Pelvis Wo Contrast  Result Date: 10/12/2018 CLINICAL DATA:  85 year old female with abdominal pain for 3-4 days, rectal bleeding for 2-3 weeks. EXAM: CT ABDOMEN AND PELVIS WITHOUT CONTRAST TECHNIQUE: Multidetector CT imaging of the abdomen and pelvis was performed following the standard protocol without IV contrast. COMPARISON:  CT Abdomen and [facility] 04/07/2018, and earlier. FINDINGS: Lower  chest: Chronic cardiomegaly. Cardiac pacemaker leads. Chronic mild lung base scarring. No pericardial or pleural effusion. Hepatobiliary: Surgically absent gallbladder. Stable noncontrast liver with several small benign cysts or hemangiomas. Pancreas: Negative. Spleen: Negative. Adrenals/Urinary Tract: Normal adrenal glands. Punctate right nephrolithiasis is stable. No  hydronephrosis. Proximal ureters are decompressed. Mild bilateral perinephric stranding is chronic. Diminutive and unremarkable urinary bladder. Stomach/Bowel: Negative rectum. Mild mesenteric stranding about the sigmoid colon with severe underlying diverticulosis (series 2, image 60). Indistinct appearance of the sigmoid in this region. No extraluminal gas or fluid. Diverticulosis continues in the descending colon with a 2nd area of questionable mild mesenteric inflammation on series 2, image 29. Redundant transverse colon also with diverticulosis, no active inflammation. No diverticulosis in the right colon. Negative terminal ileum. No dilated small bowel. Small to moderate gastric hiatal hernia is stable. No free air, free fluid. Vascular/Lymphatic: Extensive Aortoiliac calcified atherosclerosis. Vascular patency is not evaluated in the absence of IV contrast. No lymphadenopathy. Reproductive: Surgically absent. Other: No pelvic free fluid. Musculoskeletal: Osteopenia, scoliosis, spine degeneration. No acute osseous abnormality identified. IMPRESSION: 1. Severe diverticulosis of the descending and sigmoid colon. Mild Acute Diverticulitis in the mid sigmoid, and perhaps a 2nd site of active inflammation also in the mid descending colon. No perforation, abscess or complicating features. 2. No other acute or inflammatory process in the abdomen or pelvis. 3.  Aortic Atherosclerosis (ICD10-I70.0). Electronically Signed   By: Genevie Ann M.D.   On: 10/12/2018 22:53   Dg Knee Complete 4 Views Left  Result Date: 10/20/2018 CLINICAL DATA:  Fall with pain and swelling EXAM: LEFT KNEE - COMPLETE 4+ VIEW COMPARISON:  10/09/2018 FINDINGS: No acute displaced fracture or malalignment. Mild mediolateral joint space degenerative change. Joint space calcifications. Vascular calcification. No significant knee effusion. IMPRESSION: 1. No acute osseous abnormality. 2. Chondrocalcinosis.  Mild degenerative changes. Electronically  Signed   By: Donavan Foil M.D.   On: 10/20/2018 20:04   Dg Knee Complete 4 Views Right  Result Date: 10/20/2018 CLINICAL DATA:  Fall EXAM: RIGHT KNEE - COMPLETE 4+ VIEW COMPARISON:  None. FINDINGS: No acute displaced fracture or malalignment. Moderate arthritis of the lateral compartment. Trace knee effusion. Vascular calcifications. IMPRESSION: 1. No definite acute osseous abnormality.  Trace knee effusion. Electronically Signed   By: Donavan Foil M.D.   On: 10/20/2018 20:03    Microbiology: Recent Results (from the past 240 hour(s))  SARS Coronavirus 2 (CEPHEID - Performed in [place] hospital lab), Hosp Order     Status: None   Collection Time: 10/23/18  8:30 PM   Specimen: Nasopharyngeal Swab  Result Value Ref Range Status   SARS Coronavirus 2 NEGATIVE NEGATIVE Final    Comment: (NOTE) If result is NEGATIVE SARS-CoV-2 target nucleic acids are NOT DETECTED. The SARS-CoV-2 RNA is generally detectable in upper and lower  respiratory specimens during the acute phase of infection. The lowest  concentration of SARS-CoV-2 viral copies this assay can detect is 250  copies / mL. A negative result does not preclude SARS-CoV-2 infection  and should not be used as the sole basis for treatment or other  patient management decisions.  A negative result may occur with  improper specimen collection / handling, submission of specimen other  than nasopharyngeal swab, presence of viral mutation(s) within the  areas targeted by this assay, and inadequate number of viral copies  (<250 copies / mL). A negative result must be combined with clinical  observations, patient history, and epidemiological information. If result is POSITIVE  SARS-CoV-2 target nucleic acids are DETECTED. The SARS-CoV-2 RNA is generally detectable in upper and lower  respiratory specimens dur ing the acute phase of infection.  Positive  results are indicative of active infection with SARS-CoV-2.  Clinical  correlation with  patient history and other diagnostic information is  necessary to determine patient infection status.  Positive results do  not rule out bacterial infection or co-infection with other viruses. If result is PRESUMPTIVE POSTIVE SARS-CoV-2 nucleic acids MAY BE PRESENT.   A presumptive positive result was obtained on the submitted specimen  and confirmed on repeat testing.  While 2019 novel coronavirus  (SARS-CoV-2) nucleic acids may be present in the submitted sample  additional confirmatory testing may be necessary for epidemiological  and / or clinical management purposes  to differentiate between  SARS-CoV-2 and other Sarbecovirus currently known to infect humans.  If clinically indicated additional testing with an alternate test  methodology [phone]) is advised. The SARS-CoV-2 RNA is generally  detectable in upper and lower respiratory sp ecimens during the acute  phase of infection. The expected result is Negative. Fact Sheet for Patients:  StrictlyIdeas.no Fact Sheet for Healthcare Providers: BankingDealers.co.za This test is not yet approved or cleared by the [place] FDA and has been authorized for detection and/or diagnosis of SARS-CoV-2 by FDA under an Emergency Use Authorization (EUA).  This EUA will remain in effect (meaning this test can be used) for the duration of the COVID-19 declaration under Section 564(b)(1) of the Act, 21 U.S.C. section 360bbb-3(b)(1), unless the authorization is terminated or revoked sooner. Performed at [facility], [address] [place], [place] [address]      Labs: Basic Metabolic Panel: Recent Labs  Lab 10/23/18 1813 10/24/18 1153 10/25/18 0528 10/26/18 0514  NA 137 131* 138 141  K 3.6 3.9 4.2 4.3  CL 102 102 106 113*  CO2 24 18* 21* 22  GLUCOSE 94 117* 91 86  BUN 13 10 12 16   CREATININE 1.48* 1.36* 1.38* 1.26*  CALCIUM 8.1* 7.7* 8.2* 8.1*  MG  --  1.8 1.6* 2.2   Liver  Function Tests: Recent Labs  Lab 10/23/18 1813 10/24/18 1153  AST 26 27  ALT 14 10  ALKPHOS 51 49  BILITOT 0.4 0.8  PROT 6.2* 6.2*  ALBUMIN 3.5 3.4*   Recent Labs  Lab 10/23/18 1813  LIPASE 45   CBC: Recent Labs  Lab 10/23/18 1813 10/23/18 2248 10/24/18 1153 10/25/18 0528 10/26/18 0514  WBC 6.6 7.8 5.8 7.9 4.5  NEUTROABS 4.7  --   --   --   --   HGB 9.5* 9.8* 9.5* 9.5* 8.7*  HCT 30.5* 30.1* 31.1* 29.7* 27.6*  MCV 97.8 96.2 99.7 97.1 97.2  PLT 199 204 187 200 190    BNP (last 3 results) Recent Labs    04/04/18 1333 04/25/18 2005  BNP 178.0* 270.0*   Signed:  Barton Dubois MD.  [facility] 10/27/2018, 1:13 PM

## 2018-10-27 NOTE — TOC Transition Note (Signed)
Transition of Care [facility]) - CM/SW Discharge Note   Patient Details  Name: Kimberly [name] MRN: [id number] Date of Birth: 1926-08-13  [facility] Care [facility]) CM/SW Contact:  Ihor Gully, [name] Phone Number: 10/27/2018, 1:13 PM   Clinical Narrative:    Patient has made the decision to go home rather than SNF. She states that she has stuff to do at home and she no longer wants to go to SNF.  Attending notified and [facility] services will be resumed with [facility]. [place] notified of discharge. Message left for [place] at [facility]PS of d/c.  Final next level of care: Skilled Nursing Facility Barriers to Discharge: Insurance Authorization   Patient Goals and CMS Choice Patient states their goals for this hospitalization and ongoing recovery are:: Patient feels she needs SNF to rehabilitate and then return to her home. CMS Medicare.gov Compare Post Acute Care list provided to:: Patient Choice offered to / list presented to : Patient  Discharge Placement   Existing PASRR number confirmed : 10/25/18                   Discharge Plan and Services In-house Referral: Clinical Social Work   Post Acute Care Choice: Durable Medical Equipment                               Social Determinants of Health (SDOH) Interventions     Readmission Risk Interventions Readmission Risk Prevention Plan 10/15/2018  Transportation Screening Complete  PCP or Specialist Appt within 3-5 Days Complete  HRI or [place] Complete  Social Work Consult for [place] Planning/Counseling Complete  Palliative Care Screening Not Applicable  Medication Review Press photographer) Complete  Some recent data might be hidden

## 2018-10-28 ENCOUNTER — Ambulatory Visit: Payer: Medicare Other | Admitting: Cardiology

## 2018-10-28 ENCOUNTER — Telehealth: Payer: Self-pay | Admitting: Cardiology

## 2018-10-28 ENCOUNTER — Other Ambulatory Visit: Payer: Self-pay

## 2018-10-28 DIAGNOSIS — Z79891 Long term (current) use of opiate analgesic: Secondary | ICD-10-CM | POA: Diagnosis not present

## 2018-10-28 DIAGNOSIS — K208 Other esophagitis: Secondary | ICD-10-CM | POA: Diagnosis not present

## 2018-10-28 DIAGNOSIS — K259 Gastric ulcer, unspecified as acute or chronic, without hemorrhage or perforation: Secondary | ICD-10-CM | POA: Diagnosis not present

## 2018-10-28 DIAGNOSIS — N183 Chronic kidney disease, stage 3 (moderate): Secondary | ICD-10-CM | POA: Diagnosis not present

## 2018-10-28 DIAGNOSIS — I5042 Chronic combined systolic (congestive) and diastolic (congestive) heart failure: Secondary | ICD-10-CM | POA: Diagnosis not present

## 2018-10-28 DIAGNOSIS — M1990 Unspecified osteoarthritis, unspecified site: Secondary | ICD-10-CM | POA: Diagnosis not present

## 2018-10-28 DIAGNOSIS — H409 Unspecified glaucoma: Secondary | ICD-10-CM | POA: Diagnosis not present

## 2018-10-28 DIAGNOSIS — Z9181 History of falling: Secondary | ICD-10-CM | POA: Diagnosis not present

## 2018-10-28 DIAGNOSIS — I4821 Permanent atrial fibrillation: Secondary | ICD-10-CM | POA: Diagnosis not present

## 2018-10-28 DIAGNOSIS — L03116 Cellulitis of left lower limb: Secondary | ICD-10-CM | POA: Diagnosis not present

## 2018-10-28 DIAGNOSIS — I130 Hypertensive heart and chronic kidney disease with heart failure and stage 1 through stage 4 chronic kidney disease, or unspecified chronic kidney disease: Secondary | ICD-10-CM | POA: Diagnosis not present

## 2018-10-28 DIAGNOSIS — K5791 Diverticulosis of intestine, part unspecified, without perforation or abscess with bleeding: Secondary | ICD-10-CM | POA: Diagnosis not present

## 2018-10-28 DIAGNOSIS — Z7901 Long term (current) use of anticoagulants: Secondary | ICD-10-CM | POA: Diagnosis not present

## 2018-10-28 NOTE — Telephone Encounter (Signed)
Pt called after hours for c/o "labored" breathing. She says that she is short of breath walking from room to room. Chart reviewed. She just left from a hospitalization yesterday for GI bleed with anemia. Her breathing is not worse than when she left. She is not short of breath at rest and no chest pain. She wants to be seen at our office. I advised her to call when the office opens to try get an appt and will send this note to NL triage.  She has an appt with her PCP on 6/26 and I advised her to follow up on anemia.  I told her that if her breathing gets short at rest or it gets worse she should call 911.

## 2018-10-28 NOTE — Telephone Encounter (Signed)
Spoke with the patient, she agreed to seeing Kerin Ransom PA-C at 1:15 pm.       COVID-19 Pre-Screening Questions:  . In the past 7 to 10 days have you had a cough,  shortness of breath, headache, congestion, fever (100 or greater) body aches, chills, sore throat, or sudden loss of taste or sense of smell? NO . Have you been around anyone with known Covid 19. NO . Have you been around anyone who is awaiting Covid 19 test results in the past 7 to 10 days? NO . Have you been around anyone who has been exposed to Covid 19, or has mentioned symptoms of Covid 19 within the past 7 to 10 days? NO  If you have any concerns/questions about symptoms patients report during screening (either on the phone or at threshold). Contact the provider seeing the patient or DOD for further guidance.  If neither are available contact a member of the leadership team.

## 2018-10-29 ENCOUNTER — Telehealth: Payer: Self-pay | Admitting: Family Medicine

## 2018-10-29 ENCOUNTER — Ambulatory Visit: Payer: Self-pay | Admitting: Family Medicine

## 2018-10-29 NOTE — Telephone Encounter (Signed)
Pt had to r/s appt for today

## 2018-10-29 NOTE — Telephone Encounter (Signed)
Also patient states that there is a company called [employer] that could drive her. Do you know anything about them. Or their number?

## 2018-10-29 NOTE — Telephone Encounter (Signed)
Spoke with pt and she states she found the number for [employer] and was ok to scheduled appt later than 2 so appt given to pt with Dr Livia Snellen 11/03/18 at 3:55.

## 2018-10-29 NOTE — Telephone Encounter (Signed)
Patient has follow up scheduled.

## 2018-11-01 DIAGNOSIS — M1990 Unspecified osteoarthritis, unspecified site: Secondary | ICD-10-CM | POA: Diagnosis not present

## 2018-11-01 DIAGNOSIS — N183 Chronic kidney disease, stage 3 (moderate): Secondary | ICD-10-CM | POA: Diagnosis not present

## 2018-11-01 DIAGNOSIS — I130 Hypertensive heart and chronic kidney disease with heart failure and stage 1 through stage 4 chronic kidney disease, or unspecified chronic kidney disease: Secondary | ICD-10-CM | POA: Diagnosis not present

## 2018-11-01 DIAGNOSIS — Z9181 History of falling: Secondary | ICD-10-CM | POA: Diagnosis not present

## 2018-11-01 DIAGNOSIS — Z79891 Long term (current) use of opiate analgesic: Secondary | ICD-10-CM | POA: Diagnosis not present

## 2018-11-01 DIAGNOSIS — K259 Gastric ulcer, unspecified as acute or chronic, without hemorrhage or perforation: Secondary | ICD-10-CM | POA: Diagnosis not present

## 2018-11-01 DIAGNOSIS — H409 Unspecified glaucoma: Secondary | ICD-10-CM | POA: Diagnosis not present

## 2018-11-01 DIAGNOSIS — K5791 Diverticulosis of intestine, part unspecified, without perforation or abscess with bleeding: Secondary | ICD-10-CM | POA: Diagnosis not present

## 2018-11-01 DIAGNOSIS — I5042 Chronic combined systolic (congestive) and diastolic (congestive) heart failure: Secondary | ICD-10-CM | POA: Diagnosis not present

## 2018-11-01 DIAGNOSIS — L03116 Cellulitis of left lower limb: Secondary | ICD-10-CM | POA: Diagnosis not present

## 2018-11-01 DIAGNOSIS — Z7901 Long term (current) use of anticoagulants: Secondary | ICD-10-CM | POA: Diagnosis not present

## 2018-11-01 DIAGNOSIS — K208 Other esophagitis: Secondary | ICD-10-CM | POA: Diagnosis not present

## 2018-11-01 DIAGNOSIS — I4821 Permanent atrial fibrillation: Secondary | ICD-10-CM | POA: Diagnosis not present

## 2018-11-01 DIAGNOSIS — Z7689 Persons encountering health services in other specified circumstances: Secondary | ICD-10-CM | POA: Diagnosis not present

## 2018-11-02 ENCOUNTER — Telehealth: Payer: Self-pay | Admitting: Family Medicine

## 2018-11-02 ENCOUNTER — Other Ambulatory Visit: Payer: Self-pay

## 2018-11-02 NOTE — Telephone Encounter (Signed)
She states that her calfs bilateral - are very swollen  She is taking her lasix 20 regularly daily  (has doubled them and doesn't get better) She has appt tomorrow with Dr Livia Snellen.  Any recommendations for today?  Does not have a way here today. No tele visits with you avail.

## 2018-11-02 NOTE — Telephone Encounter (Signed)
Keep legs elevated. Compression hose.  Avoid salt.  Ok to continue double dose of Lasix until seen tomorrow.

## 2018-11-02 NOTE — Telephone Encounter (Signed)
Pt called and aware of recommendations from Dr Lajuana Ripple

## 2018-11-03 ENCOUNTER — Ambulatory Visit: Payer: Medicare Other | Admitting: Family Medicine

## 2018-11-03 ENCOUNTER — Other Ambulatory Visit: Payer: Self-pay

## 2018-11-03 ENCOUNTER — Ambulatory Visit (INDEPENDENT_AMBULATORY_CARE_PROVIDER_SITE_OTHER): Payer: Medicare Other | Admitting: Family Medicine

## 2018-11-03 ENCOUNTER — Telehealth: Payer: Self-pay | Admitting: Family Medicine

## 2018-11-03 ENCOUNTER — Encounter: Payer: Self-pay | Admitting: Family Medicine

## 2018-11-03 VITALS — BP 155/82 | HR 71 | Temp 97.2°F | Ht 67.0 in | Wt 148.0 lb

## 2018-11-03 DIAGNOSIS — N183 Chronic kidney disease, stage 3 unspecified: Secondary | ICD-10-CM

## 2018-11-03 DIAGNOSIS — I5032 Chronic diastolic (congestive) heart failure: Secondary | ICD-10-CM

## 2018-11-03 DIAGNOSIS — E875 Hyperkalemia: Secondary | ICD-10-CM | POA: Diagnosis not present

## 2018-11-03 DIAGNOSIS — D649 Anemia, unspecified: Secondary | ICD-10-CM

## 2018-11-03 DIAGNOSIS — I10 Essential (primary) hypertension: Secondary | ICD-10-CM | POA: Diagnosis not present

## 2018-11-03 NOTE — Patient Outreach (Signed)
[facility]) Care Management  11/03/2018  NAW LASALA May 01, 1927 [id number]   EMMI- General Discharge RED ON EMMI ALERT Day # 4 Date: 11/02/2018 Red Alert Reason:  Transportation to follow-up? No  Sad/hopeless/anxious/empty? Yes    Outreach attempt: No answer.  HIPAA compliant voice message left.     Plan: RN CM will attempt again within 4 business days and send letter.  Jone Baseman, RN, MSN [facility] Care Management Care Management Coordinator Direct Line [phone] Toll Free: [phone]  Fax: [phone]

## 2018-11-03 NOTE — Telephone Encounter (Signed)
I did.  Dr Livia Snellen is following up with her today.  I've given him a copy and messaged him with recommendations.

## 2018-11-03 NOTE — Patient Instructions (Signed)
Increase lasix to 20mg  two tablets daily for 1 week. Home health to draw a BMP in 1 week

## 2018-11-03 NOTE — Telephone Encounter (Signed)
Kimberly [name] from [facility] calling to see if we got her labs states her Potassium was 6.2,  CO2 was 13, RBC was 3.21, hgb was 3.21, MCH was 30.80. Please advise and give order if we need to change anything.

## 2018-11-03 NOTE — Progress Notes (Signed)
Subjective:  Patient ID: Kimberly [name], female    DOB: 06-Mar-1927  Age: 85 y.o. MRN: [id number]  CC: Hospitalization Follow-up   HPI Kimberly [name] presents for 2 recent hospitalizations for diverticulitis followed by rectal bleeding.  She was discharged to home 2 weeks ago.  Since that time she has noticed very little blood.  Pain has resolved.  She is concerned because she has quite a bit of swelling in the legs.  She had some blood work drawn yesterday home by home health and her potassium was elevated to 6.2.  However she denies any palpitations chest pain shortness of breath or cramping of the muscles.  Additionally patient says she is extremely difficult to phlebotomize.  She will only allow people to drop from her wrist.  I suspect that there was some hemolysis leading to her elevated potassium test on her blood work yesterday.  Depression screen [facility] 2/9 11/03/2018 10/22/2018 03/15/2018  Decreased Interest 0 1 3  Down, Depressed, Hopeless 0 1 3  PHQ - 2 Score 0 2 6  Altered sleeping - 3 3  Tired, decreased energy - 1 3  Change in appetite - 0 0  Feeling bad or failure about yourself  - 0 1  Trouble concentrating - 0 1  Moving slowly or fidgety/restless - 0 1  Suicidal thoughts - 0 0  PHQ-9 Score - 6 15  Difficult doing work/chores - Not difficult at all -  Some recent data might be hidden    History Luvinia has a past medical history of Acute blood loss anemia (10/11/2012), Acute diverticulitis (08/24/2013), Acute on chronic combined systolic and diastolic CHF, NYHA class 4 ([place]) (11/15/2013), Antral ulcer (10/11/2012), Arrhythmia, Atrial fibrillation ([place]), Cardiomyopathy, nonischemic (HCC), Chronic anticoagulation (10/12/2012), CKD (chronic kidney disease) stage 3, GFR 30-59 ml/min (HCC) (10/12/2012), Depression, Erosive esophagitis (10/11/2012), Fibromyalgia, Glaucoma, H/O echocardiogram (2007), Hypertension, Osteoarthritis, Pacemaker, and Scoliosis.   She has a past surgical history that  includes Pacemaker insertion; Abdominal hysterectomy; Cholecystectomy; Appendectomy; Breast surgery; Back surgery; Neck surgery; Hernia repair; Tonsillectomy; Esophagogastroduodenoscopy (N/A, 10/13/2012); Cystoscopy (N/A, 02/24/2013); doppler echocardiography (N/A, 05/30/2010); NUCLEAR STRESS TEST (N/A, 02/13/2009); Cardiac catheterization (12/08/2005); LOWETR EXT VENOUS (Bilateral, 11-08-10); Yag laser application (Bilateral, 11/20/2014); Colonoscopy (N/A, 03/03/2017); Esophagogastroduodenoscopy (N/A, 03/03/2017); polypectomy (03/03/2017); and biopsy (03/03/2017).   Her family history includes Asthma in her sister; Cancer in her brother and sister; Heart failure in her brother; Pulmonary embolism in her brother.She reports that she quit smoking about 13 years ago. Her smoking use included cigarettes. She has a 15.00 pack-year smoking history. She has never used smokeless tobacco. She reports current alcohol use. She reports that she does not use drugs.    ROS Review of Systems  Constitutional: Negative.   HENT: Negative.   Eyes: Negative for visual disturbance.  Respiratory: Negative for shortness of breath.   Cardiovascular: Positive for leg swelling. Negative for chest pain and palpitations.  Gastrointestinal: Negative for abdominal pain.  Musculoskeletal: Negative for arthralgias.    Objective:  BP (!) 155/82   Pulse 71   Temp (!) 97.2 F (36.2 C) (Oral)   Ht _0  (1.702 m)   Wt 148 lb (67.1 kg)   BMI 23.18 kg/m   BP Readings from Last 3 Encounters:  11/03/18 (!) 155/82  10/27/18 (!) 154/71  10/20/18 (!) 165/78    Wt Readings from Last 3 Encounters:  11/03/18 148 lb (67.1 kg)  10/26/18 162 lb 0.6 oz (73.5 kg)  10/20/18 140 lb (63.5 kg)  Physical Exam Constitutional:      General: She is not in acute distress.    Appearance: She is well-developed.  HENT:     Head: Normocephalic and atraumatic.     Right Ear: External ear normal.     Left Ear: External ear normal.      Nose: Nose normal.  Eyes:     Conjunctiva/sclera: Conjunctivae normal.     Pupils: Pupils are equal, round, and reactive to light.  Neck:     Musculoskeletal: Normal range of motion and neck supple.     Thyroid: No thyromegaly.  Cardiovascular:     Rate and Rhythm: Normal rate and regular rhythm.     Heart sounds: Normal heart sounds. No murmur.  Pulmonary:     Effort: Pulmonary effort is normal. No respiratory distress.     Breath sounds: Normal breath sounds. No wheezing or rales.  Abdominal:     General: Bowel sounds are normal. There is no distension.     Palpations: Abdomen is soft.     Tenderness: There is no abdominal tenderness.  Musculoskeletal:     Right lower leg: Edema (3+) present.     Left lower leg: Edema (3+) present.  Lymphadenopathy:     Cervical: No cervical adenopathy.  Skin:    General: Skin is warm and dry.  Neurological:     Mental Status: She is alert and oriented to person, place, and time.     Deep Tendon Reflexes: Reflexes are normal and symmetric.  Psychiatric:        Mood and Affect: Mood normal.        Behavior: Behavior normal.        Thought Content: Thought content normal.    Results for orders placed or performed during the hospital encounter of 10/23/18  SARS Coronavirus 2 (CEPHEID - Performed in [place] hospital lab), [facility] Order   Specimen: Nasopharyngeal Swab  Result Value Ref Range   SARS Coronavirus 2 NEGATIVE NEGATIVE  Comprehensive metabolic panel  Result Value Ref Range   Sodium 137 135 - 145 mmol/L   Potassium 3.6 3.5 - 5.1 mmol/L   Chloride 102 98 - 111 mmol/L   CO2 24 22 - 32 mmol/L   Glucose, Bld 94 70 - 99 mg/dL   BUN 13 8 - 23 mg/dL   Creatinine, Ser 1.48 (H) 0.44 - 1.00 mg/dL   Calcium 8.1 (L) 8.9 - 10.3 mg/dL   Total Protein 6.2 (L) 6.5 - 8.1 g/dL   Albumin 3.5 3.5 - 5.0 g/dL   AST 26 15 - 41 U/L   ALT 14 0 - 44 U/L   Alkaline Phosphatase 51 38 - 126 U/L   Total Bilirubin 0.4 0.3 - 1.2 mg/dL   GFR calc non Af  Amer 31 (L) >60 mL/min   GFR calc Af Amer 36 (L) >60 mL/min   Anion gap 11 5 - 15  Lipase, blood  Result Value Ref Range   Lipase 45 11 - 51 U/L  CBC with Differential/Platelet  Result Value Ref Range   WBC 6.6 4.0 - 10.5 K/uL   RBC 3.12 (L) 3.87 - 5.11 MIL/uL   Hemoglobin 9.5 (L) 12.0 - 15.0 g/dL   HCT 30.5 (L) 36.0 - 46.0 %   MCV 97.8 80.0 - 100.0 fL   MCH 30.4 26.0 - 34.0 pg   MCHC 31.1 30.0 - 36.0 g/dL   RDW 20.6 (H) 11.5 - 15.5 %   Platelets 199 150 - 400  K/uL   nRBC 0.0 0.0 - 0.2 %   Neutrophils Relative % 71 %   Neutro Abs 4.7 1.7 - 7.7 K/uL   Lymphocytes Relative 16 %   Lymphs Abs 1.1 0.7 - 4.0 K/uL   Monocytes Relative 10 %   Monocytes Absolute 0.6 0.1 - 1.0 K/uL   Eosinophils Relative 2 %   Eosinophils Absolute 0.1 0.0 - 0.5 K/uL   Basophils Relative 0 %   Basophils Absolute 0.0 0.0 - 0.1 K/uL   Immature Granulocytes 1 %   Abs Immature Granulocytes 0.04 0.00 - 0.07 K/uL  CBC  Result Value Ref Range   WBC 7.8 4.0 - 10.5 K/uL   RBC 3.13 (L) 3.87 - 5.11 MIL/uL   Hemoglobin 9.8 (L) 12.0 - 15.0 g/dL   HCT 30.1 (L) 36.0 - 46.0 %   MCV 96.2 80.0 - 100.0 fL   MCH 31.3 26.0 - 34.0 pg   MCHC 32.6 30.0 - 36.0 g/dL   RDW 20.8 (H) 11.5 - 15.5 %   Platelets 204 150 - 400 K/uL   nRBC 0.0 0.0 - 0.2 %  Comprehensive metabolic panel  Result Value Ref Range   Sodium 131 (L) 135 - 145 mmol/L   Potassium 3.9 3.5 - 5.1 mmol/L   Chloride 102 98 - 111 mmol/L   CO2 18 (L) 22 - 32 mmol/L   Glucose, Bld 117 (H) 70 - 99 mg/dL   BUN 10 8 - 23 mg/dL   Creatinine, Ser 1.36 (H) 0.44 - 1.00 mg/dL   Calcium 7.7 (L) 8.9 - 10.3 mg/dL   Total Protein 6.2 (L) 6.5 - 8.1 g/dL   Albumin 3.4 (L) 3.5 - 5.0 g/dL   AST 27 15 - 41 U/L   ALT 10 0 - 44 U/L   Alkaline Phosphatase 49 38 - 126 U/L   Total Bilirubin 0.8 0.3 - 1.2 mg/dL   GFR calc non Af Amer 34 (L) >60 mL/min   GFR calc Af Amer 40 (L) >60 mL/min   Anion gap 11 5 - 15  CBC  Result Value Ref Range   WBC 5.8 4.0 - 10.5 K/uL   RBC  3.12 (L) 3.87 - 5.11 MIL/uL   Hemoglobin 9.5 (L) 12.0 - 15.0 g/dL   HCT 31.1 (L) 36.0 - 46.0 %   MCV 99.7 80.0 - 100.0 fL   MCH 30.4 26.0 - 34.0 pg   MCHC 30.5 30.0 - 36.0 g/dL   RDW 20.8 (H) 11.5 - 15.5 %   Platelets 187 150 - 400 K/uL   nRBC 0.0 0.0 - 0.2 %  Urinalysis, Routine w reflex microscopic  Result Value Ref Range   Color, Urine AMBER (A) YELLOW   APPearance CLOUDY (A) CLEAR   Specific Gravity, Urine 1.013 1.005 - 1.030   pH 6.0 5.0 - 8.0   Glucose, UA NEGATIVE NEGATIVE mg/dL   Hgb urine dipstick MODERATE (A) NEGATIVE   Bilirubin Urine NEGATIVE NEGATIVE   Ketones, ur 5 (A) NEGATIVE mg/dL   Protein, ur NEGATIVE NEGATIVE mg/dL   Nitrite NEGATIVE NEGATIVE   Leukocytes,Ua MODERATE (A) NEGATIVE   RBC / HPF 6-10 0 - 5 RBC/hpf   WBC, UA 11-20 0 - 5 WBC/hpf   Bacteria, UA MANY (A) NONE SEEN   Squamous Epithelial / LPF 0-5 0 - 5   Mucus PRESENT   Magnesium  Result Value Ref Range   Magnesium 1.8 1.7 - 2.4 mg/dL  Basic metabolic panel  Result Value Ref Range  Sodium 138 135 - 145 mmol/L   Potassium 4.2 3.5 - 5.1 mmol/L   Chloride 106 98 - 111 mmol/L   CO2 21 (L) 22 - 32 mmol/L   Glucose, Bld 91 70 - 99 mg/dL   BUN 12 8 - 23 mg/dL   Creatinine, Ser 1.38 (H) 0.44 - 1.00 mg/dL   Calcium 8.2 (L) 8.9 - 10.3 mg/dL   GFR calc non Af Amer 34 (L) >60 mL/min   GFR calc Af Amer 39 (L) >60 mL/min   Anion gap 11 5 - 15  CBC  Result Value Ref Range   WBC 7.9 4.0 - 10.5 K/uL   RBC 3.06 (L) 3.87 - 5.11 MIL/uL   Hemoglobin 9.5 (L) 12.0 - 15.0 g/dL   HCT 29.7 (L) 36.0 - 46.0 %   MCV 97.1 80.0 - 100.0 fL   MCH 31.0 26.0 - 34.0 pg   MCHC 32.0 30.0 - 36.0 g/dL   RDW 20.7 (H) 11.5 - 15.5 %   Platelets 200 150 - 400 K/uL   nRBC 0.0 0.0 - 0.2 %  Magnesium  Result Value Ref Range   Magnesium 1.6 (L) 1.7 - 2.4 mg/dL  CBC  Result Value Ref Range   WBC 4.5 4.0 - 10.5 K/uL   RBC 2.84 (L) 3.87 - 5.11 MIL/uL   Hemoglobin 8.7 (L) 12.0 - 15.0 g/dL   HCT 27.6 (L) 36.0 - 46.0 %   MCV  97.2 80.0 - 100.0 fL   MCH 30.6 26.0 - 34.0 pg   MCHC 31.5 30.0 - 36.0 g/dL   RDW 20.7 (H) 11.5 - 15.5 %   Platelets 190 150 - 400 K/uL   nRBC 0.0 0.0 - 0.2 %  Basic metabolic panel  Result Value Ref Range   Sodium 141 135 - 145 mmol/L   Potassium 4.3 3.5 - 5.1 mmol/L   Chloride 113 (H) 98 - 111 mmol/L   CO2 22 22 - 32 mmol/L   Glucose, Bld 86 70 - 99 mg/dL   BUN 16 8 - 23 mg/dL   Creatinine, Ser 1.26 (H) 0.44 - 1.00 mg/dL   Calcium 8.1 (L) 8.9 - 10.3 mg/dL   GFR calc non Af Amer 38 (L) >60 mL/min   GFR calc Af Amer 44 (L) >60 mL/min   Anion gap 6 5 - 15  Magnesium  Result Value Ref Range   Magnesium 2.2 1.7 - 2.4 mg/dL  POC occult blood, ED RN will collect  Result Value Ref Range   Fecal Occult Bld POSITIVE (A) NEGATIVE  Type and screen [facility]  Result Value Ref Range   ABO/RH(D) A NEG    Antibody Screen NEG    Sample Expiration      10/26/2018,2359 Performed at [facility], [address], [place], [place] [address]       Assessment & Plan:   Kimberly [name] was seen today for hospitalization follow-up.  Diagnoses and all orders for this visit:  Essential hypertension -     CMP14+EGFR  Anemia, unspecified type  CKD (chronic kidney disease) stage 3, GFR 30-59 ml/min (HCC)  Chronic diastolic heart failure (HCC)  Hyperkalemia       I have discontinued Kayin L. Tatem's sodium chloride, QUEtiapine, ondansetron, and traMADol. I am also having her maintain her Polyethyl Glycol-Propyl Glycol, PreserVision AREDS 2, acetaminophen, carvedilol, furosemide, traZODone, potassium chloride, omeprazole, and apixaban.  Allergies as of 11/03/2018      Reactions   Ciprofloxacin Other (See Comments)  Possibly caused diarrhea November 2018   Flagyl [metronidazole] Other (See Comments)   Possibly caused diarrhea November 2018   Iron Swelling   Ferrous Sulfate - tongue swelling    Papaya Derivatives Hives   Iodine Rash, Other (See Comments)   REACTION:If injected,   Rash/irritated skin reaction "welts"   Penicillins Hives   DID THE REACTION INVOLVE: Swelling of the face/tongue/throat, SOB, or low BP? Unknown Sudden or severe rash/hives, skin peeling, or the inside of the mouth or nose? Yes Did it require medical treatment? Yes When did it last happen?22 or 85 years old If all above answers are "NO", may proceed with cephalosporin use.   Sulfa Antibiotics Rash      Medication List       Accurate as of November 03, 2018  7:36 PM. If you have any questions, ask your nurse or doctor.        STOP taking these medications   ondansetron 4 MG tablet Commonly known as: ZOFRAN Stopped by: Claretta Fraise, MD   QUEtiapine 25 MG tablet Commonly known as: SEROQUEL Stopped by: Claretta Fraise, MD   sodium chloride 0.65 % Soln nasal spray Commonly known as: OCEAN Stopped by: Claretta Fraise, MD   traMADol 50 MG tablet Commonly known as: Ultram Stopped by: Claretta Fraise, MD     TAKE these medications   acetaminophen 325 MG tablet Commonly known as: TYLENOL Take 2 tablets (650 mg total) by mouth every 6 (six) hours as needed for mild pain, fever or headache (or Fever >/= 101).   apixaban 2.5 MG Tabs tablet Commonly known as: Eliquis Take 1 tablet (2.5 mg total) by mouth 2 (two) times daily. Restart on 11/01/18   carvedilol 6.25 MG tablet Commonly known as: COREG Take 1 tablet (6.25 mg total) by mouth 2 (two) times daily with a meal.   furosemide 20 MG tablet Commonly known as: LASIX Take 1 tablet (20 mg total) by mouth daily.   omeprazole 20 MG tablet Commonly known as: PRILOSEC OTC Take 1 tablet (20 mg total) by mouth daily.   potassium chloride 10 MEQ tablet Commonly known as: K-DUR Take 1 tablet (10 mEq total) by mouth daily.   PreserVision AREDS 2 Caps Take 1 capsule by mouth 2 (two) times daily.   Systane Ultra 0.4-0.3 % Soln Generic drug: Polyethyl Glycol-Propyl Glycol Place 1-2 drops 2 (two) times daily as needed into both eyes  (dry eyes).   traZODone 150 MG tablet Commonly known as: DESYREL Take 1 tablet (150 mg total) by mouth at bedtime.      Increase Lasix 20 mg to 2 daily for 1 week.  She should have a BMP drawn next week.  And consider reducing the Lasix back to normal versus permanently leaving it 40 a day based on results of her blood work and her swelling next week.  Order sent to home health to draw a BMP in 1 week.  Based on patient's presentation, I believe that her potassium is likely factitious.    Follow-up: No follow-ups on file.  Claretta Fraise, M.D.

## 2018-11-04 ENCOUNTER — Ambulatory Visit: Payer: Self-pay

## 2018-11-04 ENCOUNTER — Other Ambulatory Visit: Payer: Self-pay

## 2018-11-04 ENCOUNTER — Ambulatory Visit (INDEPENDENT_AMBULATORY_CARE_PROVIDER_SITE_OTHER): Payer: Medicare Other | Admitting: Licensed Clinical Social Worker

## 2018-11-04 ENCOUNTER — Telehealth: Payer: Self-pay | Admitting: *Deleted

## 2018-11-04 ENCOUNTER — Telehealth: Payer: Self-pay | Admitting: Family Medicine

## 2018-11-04 DIAGNOSIS — K5791 Diverticulosis of intestine, part unspecified, without perforation or abscess with bleeding: Secondary | ICD-10-CM | POA: Diagnosis not present

## 2018-11-04 DIAGNOSIS — I4821 Permanent atrial fibrillation: Secondary | ICD-10-CM | POA: Diagnosis not present

## 2018-11-04 DIAGNOSIS — L03116 Cellulitis of left lower limb: Secondary | ICD-10-CM | POA: Diagnosis not present

## 2018-11-04 DIAGNOSIS — K208 Other esophagitis: Secondary | ICD-10-CM | POA: Diagnosis not present

## 2018-11-04 DIAGNOSIS — K219 Gastro-esophageal reflux disease without esophagitis: Secondary | ICD-10-CM

## 2018-11-04 DIAGNOSIS — I10 Essential (primary) hypertension: Secondary | ICD-10-CM | POA: Diagnosis not present

## 2018-11-04 DIAGNOSIS — M1990 Unspecified osteoarthritis, unspecified site: Secondary | ICD-10-CM | POA: Diagnosis not present

## 2018-11-04 DIAGNOSIS — F331 Major depressive disorder, recurrent, moderate: Secondary | ICD-10-CM

## 2018-11-04 DIAGNOSIS — F3178 Bipolar disorder, in full remission, most recent episode mixed: Secondary | ICD-10-CM | POA: Diagnosis not present

## 2018-11-04 DIAGNOSIS — I5042 Chronic combined systolic (congestive) and diastolic (congestive) heart failure: Secondary | ICD-10-CM | POA: Diagnosis not present

## 2018-11-04 DIAGNOSIS — H409 Unspecified glaucoma: Secondary | ICD-10-CM | POA: Diagnosis not present

## 2018-11-04 DIAGNOSIS — Z7901 Long term (current) use of anticoagulants: Secondary | ICD-10-CM | POA: Diagnosis not present

## 2018-11-04 DIAGNOSIS — K259 Gastric ulcer, unspecified as acute or chronic, without hemorrhage or perforation: Secondary | ICD-10-CM | POA: Diagnosis not present

## 2018-11-04 DIAGNOSIS — I130 Hypertensive heart and chronic kidney disease with heart failure and stage 1 through stage 4 chronic kidney disease, or unspecified chronic kidney disease: Secondary | ICD-10-CM | POA: Diagnosis not present

## 2018-11-04 DIAGNOSIS — Z79891 Long term (current) use of opiate analgesic: Secondary | ICD-10-CM | POA: Diagnosis not present

## 2018-11-04 DIAGNOSIS — N183 Chronic kidney disease, stage 3 (moderate): Secondary | ICD-10-CM | POA: Diagnosis not present

## 2018-11-04 DIAGNOSIS — Z9181 History of falling: Secondary | ICD-10-CM | POA: Diagnosis not present

## 2018-11-04 DIAGNOSIS — K279 Peptic ulcer, site unspecified, unspecified as acute or chronic, without hemorrhage or perforation: Secondary | ICD-10-CM

## 2018-11-04 LAB — CMP14+EGFR
ALT: 7 IU/L (ref 0–32)
AST: 21 IU/L (ref 0–40)
Albumin/Globulin Ratio: 1.6 (ref 1.2–2.2)
Albumin: 3.8 g/dL (ref 3.6–4.6)
Alkaline Phosphatase: 52 IU/L (ref 39–117)
BUN/Creatinine Ratio: 16 (ref 12–28)
BUN: 19 mg/dL (ref 8–27)
Bilirubin Total: 0.2 mg/dL (ref 0.0–1.2)
CO2: 19 mmol/L — ABNORMAL LOW (ref 20–29)
Calcium: 8.5 mg/dL — ABNORMAL LOW (ref 8.7–10.3)
Chloride: 107 mmol/L — ABNORMAL HIGH (ref 96–106)
Creatinine, Ser: 1.22 mg/dL — ABNORMAL HIGH (ref 0.57–1.00)
GFR calc Af Amer: 45 mL/min/{1.73_m2} — ABNORMAL LOW (ref >59)
GFR calc non Af Amer: 39 mL/min/{1.73_m2} — ABNORMAL LOW (ref >59)
Globulin, Total: 2.4 g/dL (ref 1.5–4.5)
Glucose: 107 mg/dL — ABNORMAL HIGH (ref 65–99)
Potassium: 4.6 mmol/L (ref 3.5–5.2)
Sodium: 141 mmol/L (ref 134–144)
Total Protein: 6.2 g/dL (ref 6.0–8.5)

## 2018-11-04 NOTE — Patient Outreach (Signed)
[facility]) Care Management  11/04/2018  NAHOMI HEGNER 1926/08/14 [id number]   EMMI- General Discharge RED ON EMMI ALERT Day # 4 Date:11/02/2018 Red Alert Reason: Transportation to follow-up? No  Sad/hopeless/anxious/empty? Yes    Outreach attempt:  spoke with patient. She is able to verify HIPAA.  Patient states she is doing ok right now.  Addressed red alerts with patient. She states that she has her transportation through [employer] and other transportation entities in the area.  Discussed depression with patient.  She reports that she has suffered with that for years and that she is in conversation with her physician for possible medication change.  PHQ-9= 7.  Patient also confirms ongoing support through embedded social worker Theadore Nan at [employer].  Patient declines any further needs at this time.    Plan: RN CM will close case at this time.    Jone Baseman, RN, MSN [facility] Care Management Coordinator Direct Line [phone] Cell [phone] Toll Free: [phone]  Fax: [phone]

## 2018-11-04 NOTE — Patient Instructions (Addendum)
Licensed Clinical Social Worker Visit Information     Client is residing at her home in the community . She has home health nursing support with [facility]. She has home health physical therapy support with [facility]. She has prescribed medications and is taking medications as prescribed. [name] asked client about client's Med Alert system. Client reported that she has Med Alert system . She said she has Med Alert system for in side home and has a Med Alert device for walking outdoors. She and [name] spoke of home access for client. Client and [name] spoke of whether or not a ramp could be helpful to client at  front on her home. Client  said she would think about ramp idea. She also said she has a wheelchair at her home but would not have room in the home to use wheelchair easily. Client said she was doing well with meals. She spoke of using [facility] program to help her with meals. She has walk in tub and said she enjoys using walk in tub. She said she has grab bars installed in her bathroom.  She said she has an appointment with cardiologist on 11/17/2018.  She said she is having difficulty sleeping.She also said that physical therapist is scheduled to do home visit with her this afternoon for physical therapy support. [name] spoke with client about CCM nursing support and [name] support. Client was appreciative of call from [name] on 11/04/2018  Materials Provided: No   Follow Up Plan: [name] to call client in next 3 weeks to talk with client about social work needs of client  The patient verbalized understanding of instructions provided today and declined a print copy of patient instruction materials.   [name].Anthonette Lesage MSW, [name] Licensed Clinical Social Worker [facility] Care Management [phone]

## 2018-11-04 NOTE — Telephone Encounter (Signed)
Patient was wanting her trazodone changed. Patient aware this had to be discussed in an appointment. Appointment scheduled.

## 2018-11-04 NOTE — Telephone Encounter (Signed)
Call from Kimberly [name] saying  patient's current lab results are hemoglobin 9.7 , hematacrit 30.8 , potassium 6.2.   She ask if they could hold the potassium, (K-dur 10 meq)for in the morning since she had taken one already today.  Advise to hold in am and we would watch for lab results to arrive in fax.

## 2018-11-04 NOTE — Chronic Care Management (AMB) (Signed)
  Care Management Note   Kimberly [name] is a 85 y.o. year old female who is a primary care patient of Kimberly Norlander, DO. The CM team was consulted for assistance with chronic disease management and care coordination.   I reached out to Kimberly [name] by phone today.   Review of patient status, including review of consultants reports, relevant labrotory and other test results, and collaboration with appropriate care team members and the patient's provider was performed as part of comprehensive patient evaluation and provision of chronic care management services.   Social Determinants of Health: Risk for Social Isolation; Risk for tobacco use    Patient Outreach Telephone from 10/22/2018 in [place]  PHQ-9 Total Score  6     Client is residing at her home in the community . She has home health nursing support with [facility]. She has home health physical therapy support with [facility]. She has prescribed medications and is taking medications as prescribed. [name] asked client about client's Med Alert system. Client reported that she has Med Alert system . She said she has Med Alert system for in side home and has a Med Alert device for walking outdoors. She and [name] spoke of home access for client. Client and [name] spoke of whether or not a ramp could be helpful to client at  front on her home. Client  said she would think about ramp idea. She also said she has a wheelchair at her home but would not have room in the home to use wheelchair easily. Client said she was doing well with meals. She spoke of using [facility] program to help her with meals. She has walk in tub and said she enjoys using walk in tub. She said she has grab bars installed in her bathroom.  She said she has an appointment with cardiologist on 11/17/2018.  She said she is having difficulty sleeping.She also said that physical therapist is scheduled to do home visit with her this afternoon for physical  therapy support. [name] spoke with client about CCM nursing support and [name] support. Client was appreciative of call from [name] on 11/04/2018  Follow Up Plan: [name] to call client in next 3 weeks to talk with client about social work needs of client  Kimberly [name].Kimberly [name] MSW, [name] Licensed Clinical Social Worker [facility] Care Management [phone]

## 2018-11-07 NOTE — Telephone Encounter (Signed)
Please follow up and forward to Dr Lajuana Ripple

## 2018-11-08 DIAGNOSIS — K208 Other esophagitis: Secondary | ICD-10-CM | POA: Diagnosis not present

## 2018-11-08 DIAGNOSIS — K259 Gastric ulcer, unspecified as acute or chronic, without hemorrhage or perforation: Secondary | ICD-10-CM | POA: Diagnosis not present

## 2018-11-08 DIAGNOSIS — M1990 Unspecified osteoarthritis, unspecified site: Secondary | ICD-10-CM | POA: Diagnosis not present

## 2018-11-08 DIAGNOSIS — Z9181 History of falling: Secondary | ICD-10-CM | POA: Diagnosis not present

## 2018-11-08 DIAGNOSIS — I5042 Chronic combined systolic (congestive) and diastolic (congestive) heart failure: Secondary | ICD-10-CM | POA: Diagnosis not present

## 2018-11-08 DIAGNOSIS — I4821 Permanent atrial fibrillation: Secondary | ICD-10-CM | POA: Diagnosis not present

## 2018-11-08 DIAGNOSIS — H409 Unspecified glaucoma: Secondary | ICD-10-CM | POA: Diagnosis not present

## 2018-11-08 DIAGNOSIS — N183 Chronic kidney disease, stage 3 (moderate): Secondary | ICD-10-CM | POA: Diagnosis not present

## 2018-11-08 DIAGNOSIS — K5791 Diverticulosis of intestine, part unspecified, without perforation or abscess with bleeding: Secondary | ICD-10-CM | POA: Diagnosis not present

## 2018-11-08 DIAGNOSIS — I130 Hypertensive heart and chronic kidney disease with heart failure and stage 1 through stage 4 chronic kidney disease, or unspecified chronic kidney disease: Secondary | ICD-10-CM | POA: Diagnosis not present

## 2018-11-08 DIAGNOSIS — Z79891 Long term (current) use of opiate analgesic: Secondary | ICD-10-CM | POA: Diagnosis not present

## 2018-11-08 DIAGNOSIS — L03116 Cellulitis of left lower limb: Secondary | ICD-10-CM | POA: Diagnosis not present

## 2018-11-08 DIAGNOSIS — Z7901 Long term (current) use of anticoagulants: Secondary | ICD-10-CM | POA: Diagnosis not present

## 2018-11-08 NOTE — Telephone Encounter (Signed)
Agree with the hold. Hgb stable from discharge.  Please send orders for repeat BMP for potassium check.

## 2018-11-09 ENCOUNTER — Ambulatory Visit: Payer: Self-pay

## 2018-11-09 ENCOUNTER — Ambulatory Visit: Payer: Medicare Other | Admitting: Cardiology

## 2018-11-10 ENCOUNTER — Other Ambulatory Visit (HOSPITAL_COMMUNITY)
Admission: RE | Admit: 2018-11-10 | Discharge: 2018-11-10 | Disposition: A | Discharge status: Home / Self Care | Payer: Medicare Other | Source: Skilled Nursing Facility | Attending: Family Medicine | Admitting: Family Medicine

## 2018-11-10 ENCOUNTER — Telehealth: Payer: Self-pay | Admitting: *Deleted

## 2018-11-10 DIAGNOSIS — H409 Unspecified glaucoma: Secondary | ICD-10-CM | POA: Diagnosis not present

## 2018-11-10 DIAGNOSIS — I5042 Chronic combined systolic (congestive) and diastolic (congestive) heart failure: Secondary | ICD-10-CM | POA: Diagnosis not present

## 2018-11-10 DIAGNOSIS — K5791 Diverticulosis of intestine, part unspecified, without perforation or abscess with bleeding: Secondary | ICD-10-CM | POA: Diagnosis not present

## 2018-11-10 DIAGNOSIS — Z7901 Long term (current) use of anticoagulants: Secondary | ICD-10-CM | POA: Diagnosis not present

## 2018-11-10 DIAGNOSIS — M1990 Unspecified osteoarthritis, unspecified site: Secondary | ICD-10-CM | POA: Diagnosis not present

## 2018-11-10 DIAGNOSIS — I509 Heart failure, unspecified: Secondary | ICD-10-CM | POA: Diagnosis not present

## 2018-11-10 DIAGNOSIS — I4821 Permanent atrial fibrillation: Secondary | ICD-10-CM | POA: Diagnosis not present

## 2018-11-10 DIAGNOSIS — K259 Gastric ulcer, unspecified as acute or chronic, without hemorrhage or perforation: Secondary | ICD-10-CM | POA: Diagnosis not present

## 2018-11-10 DIAGNOSIS — K208 Other esophagitis: Secondary | ICD-10-CM | POA: Diagnosis not present

## 2018-11-10 DIAGNOSIS — I130 Hypertensive heart and chronic kidney disease with heart failure and stage 1 through stage 4 chronic kidney disease, or unspecified chronic kidney disease: Secondary | ICD-10-CM | POA: Diagnosis not present

## 2018-11-10 DIAGNOSIS — Z79891 Long term (current) use of opiate analgesic: Secondary | ICD-10-CM | POA: Diagnosis not present

## 2018-11-10 DIAGNOSIS — Z9181 History of falling: Secondary | ICD-10-CM | POA: Diagnosis not present

## 2018-11-10 DIAGNOSIS — N183 Chronic kidney disease, stage 3 (moderate): Secondary | ICD-10-CM | POA: Diagnosis not present

## 2018-11-10 DIAGNOSIS — L03116 Cellulitis of left lower limb: Secondary | ICD-10-CM | POA: Diagnosis not present

## 2018-11-10 LAB — BASIC METABOLIC PANEL
Anion gap: 13 (ref 5–15)
BUN: 21 mg/dL (ref 8–23)
CO2: 21 mmol/L — ABNORMAL LOW (ref 22–32)
Calcium: 8.2 mg/dL — ABNORMAL LOW (ref 8.9–10.3)
Chloride: 103 mmol/L (ref 98–111)
Creatinine, Ser: 1.18 mg/dL — ABNORMAL HIGH (ref 0.44–1.00)
GFR calc Af Amer: 47 mL/min — ABNORMAL LOW (ref >60)
GFR calc non Af Amer: 41 mL/min — ABNORMAL LOW (ref >60)
Glucose, Bld: 102 mg/dL — ABNORMAL HIGH (ref 70–99)
Potassium: 3.3 mmol/L — ABNORMAL LOW (ref 3.5–5.1)
Sodium: 137 mmol/L (ref 135–145)

## 2018-11-10 NOTE — Telephone Encounter (Signed)
Linda nurse w/ [facility] aware of recommendations and labs with be attempted again this evening

## 2018-11-10 NOTE — Telephone Encounter (Signed)
VM from [place] w/ [facility] Was unable to draw labs today not hydrated well, encouraged to drink fluids and another nurse will be back this evening.  Pt also has increased lower extremity edema. Pitting +1 in L & R induration R leg 2 places that were not there on Monday Please advise

## 2018-11-10 NOTE — Telephone Encounter (Signed)
Without labs, difficult to tell if this is from renal injury, etc,  Would hesistate to adjust lasix further.  Recommend elevation in LE, salt restriction.  Try and collect labs.  If worsens and she develops SOB recommend eval in ED.

## 2018-11-11 ENCOUNTER — Telehealth: Payer: Self-pay | Admitting: Family Medicine

## 2018-11-11 NOTE — Telephone Encounter (Signed)
Left message to please call our office.  Home health needs to draw blood work .

## 2018-11-11 NOTE — Telephone Encounter (Signed)
Pt was calling home health nurse back, gave her Linda's number who had called here after her visit w/ the patient yesterday

## 2018-11-12 ENCOUNTER — Ambulatory Visit (INDEPENDENT_AMBULATORY_CARE_PROVIDER_SITE_OTHER): Payer: Medicare Other

## 2018-11-12 ENCOUNTER — Other Ambulatory Visit: Payer: Self-pay

## 2018-11-12 ENCOUNTER — Other Ambulatory Visit: Payer: Self-pay | Admitting: *Deleted

## 2018-11-12 DIAGNOSIS — I4821 Permanent atrial fibrillation: Secondary | ICD-10-CM | POA: Diagnosis not present

## 2018-11-12 DIAGNOSIS — K5791 Diverticulosis of intestine, part unspecified, without perforation or abscess with bleeding: Secondary | ICD-10-CM

## 2018-11-12 DIAGNOSIS — N183 Chronic kidney disease, stage 3 (moderate): Secondary | ICD-10-CM

## 2018-11-12 DIAGNOSIS — K259 Gastric ulcer, unspecified as acute or chronic, without hemorrhage or perforation: Secondary | ICD-10-CM | POA: Diagnosis not present

## 2018-11-12 DIAGNOSIS — Z79891 Long term (current) use of opiate analgesic: Secondary | ICD-10-CM | POA: Diagnosis not present

## 2018-11-12 DIAGNOSIS — K208 Other esophagitis: Secondary | ICD-10-CM

## 2018-11-12 DIAGNOSIS — L03116 Cellulitis of left lower limb: Secondary | ICD-10-CM | POA: Diagnosis not present

## 2018-11-12 DIAGNOSIS — H409 Unspecified glaucoma: Secondary | ICD-10-CM | POA: Diagnosis not present

## 2018-11-12 DIAGNOSIS — M1990 Unspecified osteoarthritis, unspecified site: Secondary | ICD-10-CM

## 2018-11-12 DIAGNOSIS — F419 Anxiety disorder, unspecified: Secondary | ICD-10-CM

## 2018-11-12 DIAGNOSIS — Z9181 History of falling: Secondary | ICD-10-CM | POA: Diagnosis not present

## 2018-11-12 DIAGNOSIS — I5042 Chronic combined systolic (congestive) and diastolic (congestive) heart failure: Secondary | ICD-10-CM

## 2018-11-12 DIAGNOSIS — I130 Hypertensive heart and chronic kidney disease with heart failure and stage 1 through stage 4 chronic kidney disease, or unspecified chronic kidney disease: Secondary | ICD-10-CM

## 2018-11-12 DIAGNOSIS — F319 Bipolar disorder, unspecified: Secondary | ICD-10-CM

## 2018-11-12 DIAGNOSIS — Z7901 Long term (current) use of anticoagulants: Secondary | ICD-10-CM | POA: Diagnosis not present

## 2018-11-12 MED ORDER — POTASSIUM CHLORIDE CRYS ER 10 MEQ PO TBCR: 10.0000 meq | EXTENDED_RELEASE_TABLET | Freq: Every day | ORAL | 1 refills | Status: DC

## 2018-11-12 NOTE — Telephone Encounter (Signed)
Phone call taken care of in different encounter.  This encounter will now be closed  

## 2018-11-15 ENCOUNTER — Other Ambulatory Visit: Payer: Self-pay

## 2018-11-16 ENCOUNTER — Emergency Department (HOSPITAL_COMMUNITY): Payer: Medicare Other | Admitting: Certified Registered"

## 2018-11-16 ENCOUNTER — Other Ambulatory Visit: Payer: Self-pay

## 2018-11-16 ENCOUNTER — Emergency Department (HOSPITAL_COMMUNITY): Payer: Medicare Other

## 2018-11-16 ENCOUNTER — Inpatient Hospital Stay: Admit: 2018-11-16 | Payer: Medicare Other | Admitting: Orthopedic Surgery

## 2018-11-16 ENCOUNTER — Encounter (HOSPITAL_COMMUNITY)
Admission: EM | Disposition: A | Discharge status: Home / Self Care | Payer: Self-pay | Source: Home / Self Care | Attending: Orthopedic Surgery

## 2018-11-16 ENCOUNTER — Ambulatory Visit: Payer: Medicare Other | Admitting: Family Medicine

## 2018-11-16 ENCOUNTER — Encounter (HOSPITAL_COMMUNITY): Payer: Self-pay | Admitting: Emergency Medicine

## 2018-11-16 ENCOUNTER — Telehealth: Payer: Self-pay | Admitting: Cardiology

## 2018-11-16 ENCOUNTER — Inpatient Hospital Stay (HOSPITAL_COMMUNITY)
Admission: EM | Admit: 2018-11-16 | Discharge: 2018-11-18 | DRG: 513 | Disposition: A | Discharge status: Home / Self Care | Payer: Medicare Other | Attending: Orthopedic Surgery | Admitting: Orthopedic Surgery

## 2018-11-16 DIAGNOSIS — F3178 Bipolar disorder, in full remission, most recent episode mixed: Secondary | ICD-10-CM

## 2018-11-16 DIAGNOSIS — Z8659 Personal history of other mental and behavioral disorders: Secondary | ICD-10-CM

## 2018-11-16 DIAGNOSIS — M79642 Pain in left hand: Secondary | ICD-10-CM | POA: Diagnosis present

## 2018-11-16 DIAGNOSIS — I509 Heart failure, unspecified: Secondary | ICD-10-CM | POA: Diagnosis not present

## 2018-11-16 DIAGNOSIS — M7989 Other specified soft tissue disorders: Secondary | ICD-10-CM | POA: Diagnosis not present

## 2018-11-16 DIAGNOSIS — I130 Hypertensive heart and chronic kidney disease with heart failure and stage 1 through stage 4 chronic kidney disease, or unspecified chronic kidney disease: Secondary | ICD-10-CM | POA: Diagnosis present

## 2018-11-16 DIAGNOSIS — I10 Essential (primary) hypertension: Secondary | ICD-10-CM | POA: Diagnosis not present

## 2018-11-16 DIAGNOSIS — Z87891 Personal history of nicotine dependence: Secondary | ICD-10-CM

## 2018-11-16 DIAGNOSIS — Z7901 Long term (current) use of anticoagulants: Secondary | ICD-10-CM

## 2018-11-16 DIAGNOSIS — I5042 Chronic combined systolic (congestive) and diastolic (congestive) heart failure: Secondary | ICD-10-CM | POA: Diagnosis not present

## 2018-11-16 DIAGNOSIS — M797 Fibromyalgia: Secondary | ICD-10-CM | POA: Diagnosis present

## 2018-11-16 DIAGNOSIS — S6992XA Unspecified injury of left wrist, hand and finger(s), initial encounter: Secondary | ICD-10-CM | POA: Diagnosis present

## 2018-11-16 DIAGNOSIS — M19042 Primary osteoarthritis, left hand: Secondary | ICD-10-CM | POA: Diagnosis not present

## 2018-11-16 DIAGNOSIS — Z743 Need for continuous supervision: Secondary | ICD-10-CM | POA: Diagnosis not present

## 2018-11-16 DIAGNOSIS — Z880 Allergy status to penicillin: Secondary | ICD-10-CM | POA: Diagnosis not present

## 2018-11-16 DIAGNOSIS — I5032 Chronic diastolic (congestive) heart failure: Secondary | ICD-10-CM | POA: Diagnosis present

## 2018-11-16 DIAGNOSIS — Y92002 Bathroom of unspecified non-institutional (private) residence single-family (private) house as the place of occurrence of the external cause: Secondary | ICD-10-CM

## 2018-11-16 DIAGNOSIS — H409 Unspecified glaucoma: Secondary | ICD-10-CM | POA: Diagnosis present

## 2018-11-16 DIAGNOSIS — S63259A Unspecified dislocation of unspecified finger, initial encounter: Secondary | ICD-10-CM | POA: Diagnosis present

## 2018-11-16 DIAGNOSIS — Z888 Allergy status to other drugs, medicaments and biological substances status: Secondary | ICD-10-CM | POA: Diagnosis not present

## 2018-11-16 DIAGNOSIS — Z950 Presence of cardiac pacemaker: Secondary | ICD-10-CM

## 2018-11-16 DIAGNOSIS — N183 Chronic kidney disease, stage 3 unspecified: Secondary | ICD-10-CM | POA: Diagnosis present

## 2018-11-16 DIAGNOSIS — M419 Scoliosis, unspecified: Secondary | ICD-10-CM | POA: Diagnosis not present

## 2018-11-16 DIAGNOSIS — Z20828 Contact with and (suspected) exposure to other viral communicable diseases: Secondary | ICD-10-CM | POA: Diagnosis present

## 2018-11-16 DIAGNOSIS — I442 Atrioventricular block, complete: Secondary | ICD-10-CM | POA: Diagnosis not present

## 2018-11-16 DIAGNOSIS — R609 Edema, unspecified: Secondary | ICD-10-CM | POA: Diagnosis not present

## 2018-11-16 DIAGNOSIS — F329 Major depressive disorder, single episode, unspecified: Secondary | ICD-10-CM | POA: Diagnosis present

## 2018-11-16 DIAGNOSIS — F319 Bipolar disorder, unspecified: Secondary | ICD-10-CM | POA: Diagnosis present

## 2018-11-16 DIAGNOSIS — L03012 Cellulitis of left finger: Secondary | ICD-10-CM | POA: Diagnosis not present

## 2018-11-16 DIAGNOSIS — I4821 Permanent atrial fibrillation: Secondary | ICD-10-CM | POA: Diagnosis not present

## 2018-11-16 DIAGNOSIS — S63263A Dislocation of metacarpophalangeal joint of left middle finger, initial encounter: Principal | ICD-10-CM | POA: Diagnosis present

## 2018-11-16 DIAGNOSIS — Z881 Allergy status to other antibiotic agents status: Secondary | ICD-10-CM | POA: Diagnosis not present

## 2018-11-16 DIAGNOSIS — M12542 Traumatic arthropathy, left hand: Secondary | ICD-10-CM | POA: Diagnosis not present

## 2018-11-16 DIAGNOSIS — S63213A Subluxation of metacarpophalangeal joint of left middle finger, initial encounter: Secondary | ICD-10-CM | POA: Diagnosis not present

## 2018-11-16 DIAGNOSIS — M10042 Idiopathic gout, left hand: Secondary | ICD-10-CM | POA: Diagnosis not present

## 2018-11-16 DIAGNOSIS — F32A Depression, unspecified: Secondary | ICD-10-CM | POA: Diagnosis present

## 2018-11-16 DIAGNOSIS — W19XXXA Unspecified fall, initial encounter: Secondary | ICD-10-CM | POA: Diagnosis not present

## 2018-11-16 DIAGNOSIS — W010XXA Fall on same level from slipping, tripping and stumbling without subsequent striking against object, initial encounter: Secondary | ICD-10-CM | POA: Diagnosis present

## 2018-11-16 DIAGNOSIS — Z882 Allergy status to sulfonamides status: Secondary | ICD-10-CM

## 2018-11-16 DIAGNOSIS — I428 Other cardiomyopathies: Secondary | ICD-10-CM | POA: Diagnosis present

## 2018-11-16 DIAGNOSIS — S5012XA Contusion of left forearm, initial encounter: Secondary | ICD-10-CM | POA: Diagnosis not present

## 2018-11-16 DIAGNOSIS — S63269A Dislocation of metacarpophalangeal joint of unspecified finger, initial encounter: Secondary | ICD-10-CM | POA: Diagnosis not present

## 2018-11-16 DIAGNOSIS — I110 Hypertensive heart disease with heart failure: Secondary | ICD-10-CM | POA: Diagnosis not present

## 2018-11-16 DIAGNOSIS — R0902 Hypoxemia: Secondary | ICD-10-CM | POA: Diagnosis not present

## 2018-11-16 DIAGNOSIS — R52 Pain, unspecified: Secondary | ICD-10-CM | POA: Diagnosis not present

## 2018-11-16 DIAGNOSIS — Z03818 Encounter for observation for suspected exposure to other biological agents ruled out: Secondary | ICD-10-CM | POA: Diagnosis not present

## 2018-11-16 HISTORY — PX: OPEN REDUCTION INTERNAL FIXATION (ORIF) DISTAL PHALANX: SHX6236

## 2018-11-16 LAB — CBC WITH DIFFERENTIAL/PLATELET
Abs Immature Granulocytes: 0.04 10*3/uL (ref 0.00–0.07)
Basophils Absolute: 0 10*3/uL (ref 0.0–0.1)
Basophils Relative: 0 %
Eosinophils Absolute: 0.1 10*3/uL (ref 0.0–0.5)
Eosinophils Relative: 1 %
HCT: 29.7 % — ABNORMAL LOW (ref 36.0–46.0)
Hemoglobin: 9.6 g/dL — ABNORMAL LOW (ref 12.0–15.0)
Immature Granulocytes: 0 %
Lymphocytes Relative: 13 %
Lymphs Abs: 1.2 10*3/uL (ref 0.7–4.0)
MCH: 31.2 pg (ref 26.0–34.0)
MCHC: 32.3 g/dL (ref 30.0–36.0)
MCV: 96.4 fL (ref 80.0–100.0)
Monocytes Absolute: 0.9 10*3/uL (ref 0.1–1.0)
Monocytes Relative: 10 %
Neutro Abs: 6.9 10*3/uL (ref 1.7–7.7)
Neutrophils Relative %: 76 %
Platelets: 174 10*3/uL (ref 150–400)
RBC: 3.08 MIL/uL — ABNORMAL LOW (ref 3.87–5.11)
RDW: 18.1 % — ABNORMAL HIGH (ref 11.5–15.5)
WBC: 9 10*3/uL (ref 4.0–10.5)
nRBC: 0 % (ref 0.0–0.2)

## 2018-11-16 LAB — SARS CORONAVIRUS 2 BY RT PCR (HOSPITAL ORDER, PERFORMED IN ~~LOC~~ HOSPITAL LAB): SARS Coronavirus 2: NEGATIVE

## 2018-11-16 LAB — BASIC METABOLIC PANEL
Anion gap: 10 (ref 5–15)
BUN: 20 mg/dL (ref 8–23)
CO2: 25 mmol/L (ref 22–32)
Calcium: 8.5 mg/dL — ABNORMAL LOW (ref 8.9–10.3)
Chloride: 104 mmol/L (ref 98–111)
Creatinine, Ser: 1.1 mg/dL — ABNORMAL HIGH (ref 0.44–1.00)
GFR calc Af Amer: 52 mL/min — ABNORMAL LOW (ref >60)
GFR calc non Af Amer: 44 mL/min — ABNORMAL LOW (ref >60)
Glucose, Bld: 110 mg/dL — ABNORMAL HIGH (ref 70–99)
Potassium: 3.4 mmol/L — ABNORMAL LOW (ref 3.5–5.1)
Sodium: 139 mmol/L (ref 135–145)

## 2018-11-16 SURGERY — OPEN REDUCTION INTERNAL FIXATION (ORIF) DISTAL PHALANX
Anesthesia: General | Site: Finger | Laterality: Left

## 2018-11-16 MED ORDER — CLINDAMYCIN PHOSPHATE 900 MG/50ML IV SOLN
900.0000 mg | INTRAVENOUS | Status: AC
  Administered 2018-11-16: 900 mg via INTRAVENOUS

## 2018-11-16 MED ORDER — LACTATED RINGERS IV SOLN
INTRAVENOUS | Status: DC
  Administered 2018-11-16: 14:00:00 via INTRAVENOUS

## 2018-11-16 MED ORDER — PROPOFOL 10 MG/ML IV BOLUS
INTRAVENOUS | Status: DC | PRN
  Administered 2018-11-16: 110 mg via INTRAVENOUS

## 2018-11-16 MED ORDER — OMEPRAZOLE MAGNESIUM 20 MG PO TBEC: 20.0000 mg | DELAYED_RELEASE_TABLET | Freq: Every day | ORAL | Status: DC

## 2018-11-16 MED ORDER — CLINDAMYCIN PHOSPHATE 900 MG/50ML IV SOLN
INTRAVENOUS | Status: AC
  Filled 2018-11-16: qty 50

## 2018-11-16 MED ORDER — FENTANYL CITRATE (PF) 100 MCG/2ML IJ SOLN
25.0000 ug | INTRAMUSCULAR | Status: DC | PRN
  Administered 2018-11-16: 50 ug via INTRAVENOUS
  Administered 2018-11-16: 25 ug via INTRAVENOUS

## 2018-11-16 MED ORDER — LACTATED RINGERS IV SOLN
INTRAVENOUS | Status: DC
  Administered 2018-11-16 (×2): via INTRAVENOUS

## 2018-11-16 MED ORDER — FUROSEMIDE 20 MG PO TABS
20.0000 mg | ORAL_TABLET | Freq: Every day | ORAL | Status: DC
  Administered 2018-11-17 – 2018-11-18 (×2): 20 mg via ORAL
  Filled 2018-11-16 (×3): qty 1

## 2018-11-16 MED ORDER — PROPOFOL 10 MG/ML IV BOLUS
INTRAVENOUS | Status: AC
  Filled 2018-11-16: qty 20

## 2018-11-16 MED ORDER — PHENYLEPHRINE 40 MCG/ML (10ML) SYRINGE FOR IV PUSH (FOR BLOOD PRESSURE SUPPORT)
PREFILLED_SYRINGE | INTRAVENOUS | Status: AC
  Filled 2018-11-16: qty 10

## 2018-11-16 MED ORDER — CLINDAMYCIN PHOSPHATE 900 MG/50ML IV SOLN
900.0000 mg | Freq: Three times a day (TID) | INTRAVENOUS | Status: DC
  Administered 2018-11-16 – 2018-11-18 (×5): 900 mg via INTRAVENOUS
  Filled 2018-11-16 (×8): qty 50

## 2018-11-16 MED ORDER — DEXAMETHASONE SODIUM PHOSPHATE 10 MG/ML IJ SOLN
INTRAMUSCULAR | Status: DC | PRN
  Administered 2018-11-16: 5 mg via INTRAVENOUS

## 2018-11-16 MED ORDER — FENTANYL CITRATE (PF) 100 MCG/2ML IJ SOLN
INTRAMUSCULAR | Status: AC
  Administered 2018-11-16: 17:00:00 25 ug via INTRAVENOUS
  Filled 2018-11-16: qty 2

## 2018-11-16 MED ORDER — OXYCODONE HCL 5 MG PO TABS
ORAL_TABLET | ORAL | Status: AC
  Filled 2018-11-16: qty 1

## 2018-11-16 MED ORDER — 0.9 % SODIUM CHLORIDE (POUR BTL) OPTIME
TOPICAL | Status: DC | PRN
  Administered 2018-11-16 (×2): 1000 mL

## 2018-11-16 MED ORDER — POTASSIUM CHLORIDE CRYS ER 10 MEQ PO TBCR
10.0000 meq | EXTENDED_RELEASE_TABLET | Freq: Every day | ORAL | Status: DC
  Administered 2018-11-16 – 2018-11-18 (×3): 10 meq via ORAL
  Filled 2018-11-16 (×3): qty 1

## 2018-11-16 MED ORDER — ONDANSETRON HCL 4 MG/2ML IJ SOLN
INTRAMUSCULAR | Status: DC | PRN
  Administered 2018-11-16: 4 mg via INTRAVENOUS

## 2018-11-16 MED ORDER — CHLORHEXIDINE GLUCONATE 4 % EX LIQD: 60.0000 mL | Freq: Once | CUTANEOUS | Status: DC

## 2018-11-16 MED ORDER — HYDROCODONE-ACETAMINOPHEN 7.5-325 MG PO TABS: 1.0000 | ORAL_TABLET | ORAL | Status: DC | PRN

## 2018-11-16 MED ORDER — LIDOCAINE HCL (CARDIAC) PF 100 MG/5ML IV SOSY
PREFILLED_SYRINGE | INTRAVENOUS | Status: DC | PRN
  Administered 2018-11-16: 60 mg via INTRATRACHEAL

## 2018-11-16 MED ORDER — ACETAMINOPHEN 325 MG PO TABS: 650.0000 mg | ORAL_TABLET | Freq: Four times a day (QID) | ORAL | Status: DC | PRN

## 2018-11-16 MED ORDER — HYDROCODONE-ACETAMINOPHEN 5-325 MG PO TABS
1.0000 | ORAL_TABLET | ORAL | Status: DC | PRN
  Administered 2018-11-17: 1 via ORAL
  Filled 2018-11-16: qty 1

## 2018-11-16 MED ORDER — POLYETHYL GLYCOL-PROPYL GLYCOL 0.4-0.3 % OP SOLN: 1.0000 [drp] | Freq: Two times a day (BID) | OPHTHALMIC | Status: DC | PRN

## 2018-11-16 MED ORDER — MORPHINE SULFATE (PF) 2 MG/ML IV SOLN: 0.5000 mg | INTRAVENOUS | Status: DC | PRN

## 2018-11-16 MED ORDER — BUPIVACAINE HCL (PF) 0.25 % IJ SOLN
INTRAMUSCULAR | Status: DC | PRN
  Administered 2018-11-16: 30 mL

## 2018-11-16 MED ORDER — BACITRACIN-NEOMYCIN-POLYMYXIN 400-5-5000 EX OINT
TOPICAL_OINTMENT | CUTANEOUS | Status: AC
  Filled 2018-11-16: qty 1

## 2018-11-16 MED ORDER — MORPHINE SULFATE (PF) 4 MG/ML IV SOLN
4.0000 mg | Freq: Once | INTRAVENOUS | Status: AC
  Administered 2018-11-16: 4 mg via INTRAVENOUS
  Filled 2018-11-16: qty 1

## 2018-11-16 MED ORDER — ACETAMINOPHEN 325 MG PO TABS: 325.0000 mg | ORAL_TABLET | Freq: Four times a day (QID) | ORAL | Status: DC | PRN

## 2018-11-16 MED ORDER — ACETAMINOPHEN 500 MG PO TABS
500.0000 mg | ORAL_TABLET | Freq: Four times a day (QID) | ORAL | Status: AC
  Administered 2018-11-16 – 2018-11-17 (×4): 500 mg via ORAL
  Filled 2018-11-16 (×4): qty 1

## 2018-11-16 MED ORDER — PANTOPRAZOLE SODIUM 40 MG PO TBEC
40.0000 mg | DELAYED_RELEASE_TABLET | Freq: Every day | ORAL | Status: DC
  Administered 2018-11-16 – 2018-11-18 (×3): 40 mg via ORAL
  Filled 2018-11-16 (×3): qty 1

## 2018-11-16 MED ORDER — ONDANSETRON HCL 4 MG/2ML IJ SOLN: 4.0000 mg | Freq: Four times a day (QID) | INTRAMUSCULAR | Status: DC | PRN

## 2018-11-16 MED ORDER — OXYCODONE HCL 5 MG PO TABS
5.0000 mg | ORAL_TABLET | Freq: Once | ORAL | Status: AC | PRN
  Administered 2018-11-16: 5 mg via ORAL

## 2018-11-16 MED ORDER — FENTANYL CITRATE (PF) 250 MCG/5ML IJ SOLN
INTRAMUSCULAR | Status: DC | PRN
  Administered 2018-11-16: 50 ug via INTRAVENOUS

## 2018-11-16 MED ORDER — CARVEDILOL 6.25 MG PO TABS
6.2500 mg | ORAL_TABLET | Freq: Two times a day (BID) | ORAL | Status: DC
  Administered 2018-11-17 – 2018-11-18 (×3): 6.25 mg via ORAL
  Filled 2018-11-16 (×3): qty 1

## 2018-11-16 MED ORDER — FENTANYL CITRATE (PF) 250 MCG/5ML IJ SOLN
INTRAMUSCULAR | Status: AC
  Filled 2018-11-16: qty 5

## 2018-11-16 MED ORDER — ONDANSETRON HCL 4 MG PO TABS: 4.0000 mg | ORAL_TABLET | Freq: Four times a day (QID) | ORAL | Status: DC | PRN

## 2018-11-16 MED ORDER — TRAZODONE HCL 50 MG PO TABS
150.0000 mg | ORAL_TABLET | Freq: Every day | ORAL | Status: DC
  Administered 2018-11-16 – 2018-11-17 (×2): 150 mg via ORAL
  Filled 2018-11-16 (×3): qty 3

## 2018-11-16 MED ORDER — BACITRACIN-NEOMYCIN-POLYMYXIN OINTMENT TUBE
TOPICAL_OINTMENT | CUTANEOUS | Status: DC | PRN
  Administered 2018-11-16: 1 via TOPICAL

## 2018-11-16 MED ORDER — BUPIVACAINE HCL (PF) 0.25 % IJ SOLN
INTRAMUSCULAR | Status: AC
  Filled 2018-11-16: qty 30

## 2018-11-16 MED ORDER — COLCHICINE 0.6 MG PO TABS
0.6000 mg | ORAL_TABLET | Freq: Every day | ORAL | Status: DC
  Administered 2018-11-16 – 2018-11-18 (×3): 0.6 mg via ORAL
  Filled 2018-11-16 (×3): qty 1

## 2018-11-16 MED ORDER — PHENYLEPHRINE HCL (PRESSORS) 10 MG/ML IV SOLN
INTRAVENOUS | Status: DC | PRN
  Administered 2018-11-16 (×2): 40 ug via INTRAVENOUS
  Administered 2018-11-16: 80 ug via INTRAVENOUS

## 2018-11-16 MED ORDER — POLYVINYL ALCOHOL 1.4 % OP SOLN
1.0000 [drp] | OPHTHALMIC | Status: DC | PRN
  Filled 2018-11-16: qty 15

## 2018-11-16 MED ORDER — OXYCODONE HCL 5 MG/5ML PO SOLN: 5.0000 mg | Freq: Once | ORAL | Status: AC | PRN

## 2018-11-16 SURGICAL SUPPLY — 61 items
BANDAGE ACE 3X5.8 VEL STRL LF (GAUZE/BANDAGES/DRESSINGS) ×2 IMPLANT
BANDAGE ELASTIC 3 VELCRO ST LF (GAUZE/BANDAGES/DRESSINGS) ×1 IMPLANT
BLADE CLIPPER SURG (BLADE) IMPLANT
BNDG CMPR 9X4 STRL LF SNTH (GAUZE/BANDAGES/DRESSINGS) ×1
BNDG ELASTIC 4X5.8 VLCR STR LF (GAUZE/BANDAGES/DRESSINGS) ×2 IMPLANT
BNDG ESMARK 4X9 LF (GAUZE/BANDAGES/DRESSINGS) ×2 IMPLANT
BNDG GAUZE ELAST 4 BULKY (GAUZE/BANDAGES/DRESSINGS) ×5 IMPLANT
CORD BIPOLAR FORCEPS 12FT (ELECTRODE) ×2 IMPLANT
COVER SURGICAL LIGHT HANDLE (MISCELLANEOUS) ×2 IMPLANT
COVER WAND RF STERILE (DRAPES) ×2 IMPLANT
CUFF TOURN SGL QUICK 18X4 (TOURNIQUET CUFF) ×2 IMPLANT
CUFF TOURN SGL QUICK 24 (TOURNIQUET CUFF)
CUFF TRNQT CYL 24X4X16.5-23 (TOURNIQUET CUFF) IMPLANT
DRAIN TLS ROUND 10FR (DRAIN) IMPLANT
DRAPE OEC MINIVIEW 54X84 (DRAPES) IMPLANT
DRAPE SURG 17X23 STRL (DRAPES) ×2 IMPLANT
DRSG ADAPTIC 3X8 NADH LF (GAUZE/BANDAGES/DRESSINGS) ×1 IMPLANT
GAUZE SPONGE 4X4 12PLY STRL (GAUZE/BANDAGES/DRESSINGS) ×2 IMPLANT
GAUZE SPONGE 4X4 12PLY STRL LF (GAUZE/BANDAGES/DRESSINGS) ×1 IMPLANT
GAUZE XEROFORM 1X8 LF (GAUZE/BANDAGES/DRESSINGS) ×2 IMPLANT
GAUZE XEROFORM 5X9 LF (GAUZE/BANDAGES/DRESSINGS) ×1 IMPLANT
GLOVE BIOGEL M 8.0 STRL (GLOVE) ×2 IMPLANT
GLOVE SS BIOGEL STRL SZ 8 (GLOVE) ×1 IMPLANT
GLOVE SUPERSENSE BIOGEL SZ 8 (GLOVE) ×1
GOWN STRL REUS W/ TWL LRG LVL3 (GOWN DISPOSABLE) ×3 IMPLANT
GOWN STRL REUS W/ TWL XL LVL3 (GOWN DISPOSABLE) ×3 IMPLANT
GOWN STRL REUS W/TWL LRG LVL3 (GOWN DISPOSABLE) ×6
GOWN STRL REUS W/TWL XL LVL3 (GOWN DISPOSABLE) ×6
KIT BASIN OR (CUSTOM PROCEDURE TRAY) ×2 IMPLANT
KIT TURNOVER KIT B (KITS) ×2 IMPLANT
MANIFOLD NEPTUNE II (INSTRUMENTS) ×2 IMPLANT
NDL HYPO 25GX1X1/2 BEV (NEEDLE) IMPLANT
NEEDLE 22X1 1/2 (OR ONLY) (NEEDLE) IMPLANT
NEEDLE HYPO 25GX1X1/2 BEV (NEEDLE) ×2 IMPLANT
NS IRRIG 1000ML POUR BTL (IV SOLUTION) ×2 IMPLANT
PACK ORTHO EXTREMITY (CUSTOM PROCEDURE TRAY) ×2 IMPLANT
PAD ARMBOARD 7.5X6 YLW CONV (MISCELLANEOUS) ×4 IMPLANT
PAD CAST 3X4 CTTN HI CHSV (CAST SUPPLIES) ×1 IMPLANT
PAD CAST 4YDX4 CTTN HI CHSV (CAST SUPPLIES) ×1 IMPLANT
PADDING CAST COTTON 3X4 STRL (CAST SUPPLIES) ×2
PADDING CAST COTTON 4X4 STRL (CAST SUPPLIES) ×2
SOL PREP POV-IOD 4OZ 10% (MISCELLANEOUS) ×4 IMPLANT
SPLINT FIBERGLASS 3X35 (CAST SUPPLIES) ×1 IMPLANT
SPONGE LAP 4X18 RFD (DISPOSABLE) IMPLANT
SUT FIBERWIRE 4-0 18 DIAM BLUE (SUTURE) ×2
SUT MNCRL AB 4-0 PS2 18 (SUTURE) ×2 IMPLANT
SUT PROLENE 3 0 PS 2 (SUTURE) IMPLANT
SUT PROLENE 4 0 PS 2 18 (SUTURE) ×1 IMPLANT
SUT PROLENE 5 0 P 3 (SUTURE) ×1 IMPLANT
SUT VIC AB 3-0 FS2 27 (SUTURE) IMPLANT
SUT VIC AB 4-0 P-3 18X BRD (SUTURE) IMPLANT
SUT VIC AB 4-0 P3 18 (SUTURE) ×2
SUTURE FIBERWR 4-0 18 DIA BLUE (SUTURE) IMPLANT
SWAB COLLECTION DEVICE MRSA (MISCELLANEOUS) ×1 IMPLANT
SYR CONTROL 10ML LL (SYRINGE) ×1 IMPLANT
SYSTEM CHEST DRAIN TLS 7FR (DRAIN) IMPLANT
TOWEL GREEN STERILE (TOWEL DISPOSABLE) ×2 IMPLANT
TOWEL GREEN STERILE FF (TOWEL DISPOSABLE) ×2 IMPLANT
TUBE CONNECTING 12X1/4 (SUCTIONS) ×2 IMPLANT
TUBE EVACUATION TLS (MISCELLANEOUS) ×2 IMPLANT
WATER STERILE IRR 1000ML POUR (IV SOLUTION) ×2 IMPLANT

## 2018-11-16 NOTE — Anesthesia Preprocedure Evaluation (Signed)
Anesthesia Evaluation  Patient identified by MRN, date of birth, ID band Patient awake    Reviewed: Allergy & Precautions, H&P , NPO status , Patient's Chart, lab work & pertinent test results  Airway Mallampati: II   Neck ROM: full    Dental   Pulmonary former smoker,    breath sounds clear to auscultation       Cardiovascular hypertension, +CHF  + dysrhythmias + pacemaker  Rhythm:regular Rate:Normal     Neuro/Psych PSYCHIATRIC DISORDERS Depression Bipolar Disorder  Neuromuscular disease    GI/Hepatic PUD,   Endo/Other    Renal/GU Renal InsufficiencyRenal disease     Musculoskeletal  (+) Arthritis , Fibromyalgia -  Abdominal   Peds  Hematology   Anesthesia Other Findings   Reproductive/Obstetrics                             Anesthesia Physical Anesthesia Plan  ASA: III  Anesthesia Plan: General   Post-op Pain Management:    Induction: Intravenous  PONV Risk Score and Plan: 3 and Ondansetron, Dexamethasone and Treatment may vary due to age or medical condition  Airway Management Planned: LMA  Additional Equipment:   Intra-op Plan:   Post-operative Plan:   Informed Consent: I have reviewed the patients History and Physical, chart, labs and discussed the procedure including the risks, benefits and alternatives for the proposed anesthesia with the patient or authorized representative who has indicated his/her understanding and acceptance.       Plan Discussed with: CRNA, Anesthesiologist and Surgeon  Anesthesia Plan Comments:         Anesthesia Quick Evaluation

## 2018-11-16 NOTE — ED Notes (Signed)
Consent signed by patient.

## 2018-11-16 NOTE — ED Provider Notes (Signed)
Emergency Department Provider Note   I have reviewed the triage vital signs and the nursing notes.   HISTORY  Chief Complaint Fall   HPI Kimberly [name] is a 85 y.o. female with past medical history reviewed below presents to the emergency department with left hand swelling and pain.  Patient states that yesterday she was getting out of the bathtub which is slippery.  She began to slip and caught herself with her left hand on a bar.  She began having pain and swelling since catching herself.  She did not fall to the ground or strike her head.  Patient has been compliant with her home medications including Eliquis.  She denies any fevers or chills.  She notes swelling over the hand with some associated redness.  No cuts or scrapes to the hand.  No injury to other parts of her body.  Past Medical History:  Diagnosis Date  . Acute blood loss anemia 10/11/2012  . Acute diverticulitis 08/24/2013  . Acute on chronic combined systolic and diastolic CHF, NYHA class 4 ([place]) 11/15/2013  . Antral ulcer 10/11/2012  . Arrhythmia    atrial fibb  . Atrial fibrillation ([place])   . Cardiomyopathy, nonischemic ([place])   . Chronic anticoagulation 10/12/2012  . CKD (chronic kidney disease) stage 3, GFR 30-59 ml/min (HCC) 10/12/2012  . Depression   . Erosive esophagitis 10/11/2012  . Fibromyalgia   . Glaucoma   . H/O echocardiogram 2007   EF 40-45%,         . Hypertension   . Osteoarthritis   . Pacemaker    Last saw cards 07/2013  . Scoliosis     Patient Active Problem List   Diagnosis Date Noted  . Finger dislocation, initial encounter 11/16/2018  . Hand trauma, left, initial encounter 11/16/2018  . Acute blood loss anemia 10/25/2018  . Rectal bleeding 10/23/2018  . Acute GI bleeding   . Enteritis of small intestine due to enterotoxigenic Escherichia coli associated with diarrhea 03/24/2017  . Bipolar affective disorder ([place]) 03/23/2017  . Olfactory hallucinations 03/23/2017  . Lower GI bleed   .  Glaucoma 02/28/2017  . Pacemaker 03/21/2015  . Non-ischemic cardiomyopathy ([place]) 11/28/2014  . PUD (peptic ulcer disease) 06/14/2014  . History of bipolar disorder   . Hypokalemia 05/26/2014  . Fall at home 05/26/2014  . Chronic diastolic heart failure ([place]) 11/15/2013  . Complete heart block ([place]) 11/15/2013  . Inability to ambulate due to ankle or foot 09/05/2013  . Unspecified constipation 08/26/2013  . Anemia 08/25/2013  . Acute diverticulitis 08/24/2013  . CKD (chronic kidney disease) stage 3, GFR 30-59 ml/min (HCC) 10/12/2012  . Chronic anticoagulation 10/12/2012  . Osteoarthritis   . Permanent atrial fibrillation   . Biventricular cardiac pacemaker - [employer] Consulta   . Fibromyalgia   . Essential hypertension   . Depression     Past Surgical History:  Procedure Laterality Date  . ABDOMINAL HYSTERECTOMY    . APPENDECTOMY    . BACK SURGERY    . BIOPSY  03/03/2017   Procedure: BIOPSY;  Surgeon: Danie Binder, MD;  Location: [facility];  Service: Endoscopy;;  gastric  . BREAST SURGERY    . CARDIAC CATHETERIZATION  12/08/2005   LAD AND LEFT MAIN WITH NO HIGH-GRADE STENOSIS. MILD DISEASE IN THE CX AND LAD SYSTEM. SEVERE LV DYSFUNCTION WITH DILATION OF THE LV. EF 15-20%. LV END-DIASTOLIC PRESSURE IS 90. +1 MR.  . CHOLECYSTECTOMY    . COLONOSCOPY N/A 03/03/2017  Procedure: COLONOSCOPY;  Surgeon: Danie Binder, MD;  Location: [facility];  Service: Endoscopy;  Laterality: N/A;  . CYSTOSCOPY N/A 02/24/2013   Procedure: CYSTOSCOPY WITH URETHRAL DILITATION;  Surgeon: Marissa Nestle, MD;  Location: [facility] ORS;  Service: Urology;  Laterality: N/A;  . DOPPLER ECHOCARDIOGRAPHY N/A 05/30/2010   LV SIZE IS NORMAL. LV SYSTOLIC FUNCTION IS LOW NORMAL. EF=50-55%. MILD INFERIOR HYPOKINESIS.MILD TO MODERATE POSTERIOR WALL HYPOKINESIS.PACEMAKER LEAD IN THE RV. LA IS MILDLY DILATED. RA IS MODERATE TO SEVERLY DILATED. PACEMAKER LEAD IN THE RA. MILD CALCICICATION OF THE MV APPARATUS.  MODERATE MR. MILD TO MODERATE TR. MILD PHTN.AV MILDLY SCLEROTIC.  [name] ESOPHAGOGASTRODUODENOSCOPY N/A 10/13/2012   Dr. Gala Romney: severe ulcerative reflux esophagitis, question of Barrett's but negative path, single deep prepyloric antral ulcer, negative H.pylori  . ESOPHAGOGASTRODUODENOSCOPY N/A 03/03/2017   Procedure: ESOPHAGOGASTRODUODENOSCOPY (EGD);  Surgeon: Danie Binder, MD;  [facility];  Service: Endoscopy;  Laterality: N/A;  . HERNIA REPAIR     right inguinal hernia and umbilical  . LOWETR EXT VENOUS Bilateral 11-08-10   R & L- NO EVIDENCE OF THROMBUS OR THROMBOPHLEBITIS. THERE IS MILD AMOUNT OF SUBCUTANEOUS EDEMA NOTED WITHIN THE LEFT CALF AND ANKLE. R & L GSV AND SSV- NO VENOUS INSUFF NOTED.  [name] NECK SURGERY    . NUCLEAR STRESS TEST N/A 02/13/2009   NORMAL PATTERN OF PERFUSION IN ALL REGIONS. POST STRESS VENTICULAR SIZE IS NORMAL. POST  STESS EF 85%.  NORMAL MYOCARDIAL PERFUSION STUDY.  [name] PACEMAKER INSERTION    . POLYPECTOMY  03/03/2017   Procedure: POLYPECTOMY;  Surgeon: Danie Binder, MD;  [facility];  Service: Endoscopy;;  colon  . TONSILLECTOMY    . YAG LASER APPLICATION Bilateral 09/19/15   Procedure: YAG LASER APPLICATION;  Surgeon: Williams Che, MD;  Location: [facility];  Service: Ophthalmology;  Laterality: Bilateral;    Allergies Ciprofloxacin, Flagyl [metronidazole], Iron, Papaya derivatives, Iodine, Penicillins, and Sulfa antibiotics  Family History  Problem Relation Age of Onset  . Cancer Sister   . Asthma Sister   . Heart failure Brother   . Pulmonary embolism Brother   . Cancer Brother   . Colon cancer Neg Hx     Social History Social History   Tobacco Use  . Smoking status: Former Smoker    Packs/day: 0.50    Years: 30.00    Pack years: 15.00    Types: Cigarettes    Quit date: 12/13/2004    Years since quitting: 13.9  . Smokeless tobacco: Never Used  . Tobacco comment: former smoker  Substance Use Topics  . Alcohol use: Yes     Alcohol/week: 0.0 standard drinks    Comment: history of drinking a 1/2 glass of wine in the evening  . Drug use: No    Review of Systems  Constitutional: No fever/chills Eyes: No visual changes. ENT: No sore throat. Cardiovascular: Denies chest pain. Respiratory: Denies shortness of breath. Gastrointestinal: No abdominal pain.  No nausea, no vomiting.  No diarrhea.  No constipation. Genitourinary: Negative for dysuria. Musculoskeletal: Negative for back pain. Positive left hand pain.  Skin: Left hand swelling and redness.  Neurological: Negative for headaches, focal weakness or numbness.  10-point ROS otherwise negative.  ____________________________________________   PHYSICAL EXAM:  VITAL SIGNS: ED Triage Vitals  Enc Vitals Group     BP 11/16/18 0810 (!) 152/73     Pulse Rate 11/16/18 0810 76     Resp 11/16/18 0810 18  Temp 11/16/18 0810 97.9 F (36.6 C)     Temp Source 11/16/18 0810 Oral     SpO2 11/16/18 0810 99 %     Weight 11/16/18 0811 149 lb (67.6 kg)     Height 11/16/18 0811 5\' 7"  (1.702 m)     Pain Score 11/16/18 0810 10   Constitutional: Alert and oriented. Well appearing and in no acute distress. Eyes: Conjunctivae are normal.  Head: Atraumatic. Nose: No congestion/rhinnorhea. Mouth/Throat: Mucous membranes are moist.  Neck: No stridor.  Cardiovascular: Normal rate, regular rhythm. Good peripheral circulation. Grossly normal heart sounds.   Respiratory: Normal respiratory effort.  No retractions. Lungs CTAB. Gastrointestinal: No distention.  Musculoskeletal: Swelling over the left hand primarily in the 2nd, 3rd, and 5th digits. Swelling extends dorsally over the MCP joints. Normal ROM of the wrist and fingers. Ring in place over the 4th digit.  Neurologic:  Normal speech and language. Skin:  Skin is warm, dry and intact. Mild left forearm bruising without tenderness or skin breakdown. Warmth and redness over the hand as above.     ____________________________________________   LABS (all labs ordered are listed, but only abnormal results are displayed)  Labs Reviewed  BASIC METABOLIC PANEL - Abnormal; Notable for the following components:      Result Value   Potassium 3.4 (*)    Glucose, Bld 110 (*)    Creatinine, Ser 1.10 (*)    Calcium 8.5 (*)    GFR calc non Af Amer 44 (*)    GFR calc Af Amer 52 (*)    All other components within normal limits  CBC WITH DIFFERENTIAL/PLATELET - Abnormal; Notable for the following components:   RBC 3.08 (*)    Hemoglobin 9.6 (*)    HCT 29.7 (*)    RDW 18.1 (*)    All other components within normal limits  SARS CORONAVIRUS 2 (HOSPITAL ORDER, [place] [facility])  AEROBIC/ANAEROBIC CULTURE (SURGICAL/DEEP WOUND)  AEROBIC/ANAEROBIC CULTURE (SURGICAL/DEEP WOUND)  CBC  BASIC METABOLIC PANEL  CYTOLOGY - NON PAP   ____________________________________________  RADIOLOGY  Dg Hand Complete Left  Result Date: 11/16/2018 CLINICAL DATA:  Status post reduction of left middle finger. EXAM: LEFT HAND - COMPLETE 3+ VIEW COMPARISON:  None. FINDINGS: There appears to be persistent volar dislocation of the third metacarpophalangeal joint. No definite fracture is noted at this time. Degenerative changes are seen involving the first carpometacarpal and first metacarpophalangeal joints. IMPRESSION: Persistent dislocation of third metacarpophalangeal joint is noted. Degenerative changes are noted as described above. Electronically Signed   By: Marijo Conception M.D.   On: 11/16/2018 10:14   Dg Hand Complete Left  Result Date: 11/16/2018 CLINICAL DATA:  Pain 4 days ago. EXAM: LEFT HAND - COMPLETE 3+ VIEW COMPARISON:  None. FINDINGS: There is a dislocation at the third MCP joint with overlying soft tissue swelling. There are some soft tissue calcifications adjacent to the third MCP joint. There is some mild irregularity at the base of the third proximal phalanx. On the lateral  view, it is difficult to see the metacarpals of all 5 digits. I believe to the metacarpals overlap on the lateral view. No other evidence of fracture or dislocation. Degenerative changes are seen in the carpal bones. There is subluxation at the first MCP joint without dislocation. No other acute abnormalities are identified. IMPRESSION: 1. There is a dislocation at the third MCP joint. Irregularity at the base of the proximal third phalanx could be degenerative or from subtle  fracture given the dislocation. There is overlapping soft tissue swelling. Once the dislocation is reduced, recommend repeat imaging to better evaluate the base of the third proximal phalanx in the distal third metacarpal to evaluate for subtle fracture. 2. Degenerative changes. 3. No other acute abnormalities. Electronically Signed   By: Dorise Bullion III M.D   On: 11/16/2018 09:16    ____________________________________________   PROCEDURES  Procedure(s) performed:   .Foreign Body Removal  Date/Time: 11/16/2018 8:52 AM Performed by: Margette Fast, MD Authorized by: Margette Fast, MD  Consent: Verbal consent obtained. Risks and benefits: risks, benefits and alternatives were discussed Consent given by: patient Patient identity confirmed: verbally with patient Intake: finger - left ring finger.  Sedation: Patient sedated: no  Patient restrained: no Complexity: simple 1 objects recovered. Objects recovered: Ring Post-procedure assessment: foreign body removed Patient tolerance: patient tolerated the procedure well with no immediate complications Comments: Manual ring cutter used to cut ring along the palmar surface. Forceps used to widen the break and remove the ring. No underlying skin damage. Patient tolerated well.  Reduction of dislocation  Date/Time: 11/16/2018 8:56 PM Performed by: Margette Fast, MD Authorized by: Margette Fast, MD  Consent: Verbal consent obtained. Written consent obtained. Consent  given by: patient Required items: required blood products, implants, devices, and special equipment available Patient identity confirmed: verbally with patient and hospital-assigned identification number Time out: Immediately prior to procedure a "time out" was called to verify the correct patient, procedure, equipment, support staff and site/side marked as required. Local anesthesia used: no  Anesthesia: Local anesthesia used: no  Sedation: Patient sedated: no  Patient tolerance: patient tolerated the procedure well with no immediate complications Comments: Patient given morphine for pain and tolerating manipulation well. Applied traction to the third finger with hand over the 3rd MCP. Feel slipping back in place but then returns to dislocated position. Patient tolerated procedure with minimal discomfort. Reduction not successful.       ____________________________________________   INITIAL IMPRESSION / ASSESSMENT AND PLAN / ED COURSE  Pertinent labs & imaging results that were available during my care of the patient were reviewed by me and considered in my medical decision making (see chart for details).   Patient presents to the emergency department with left hand pain and swelling after fall.  Patient attributes the swelling and discomfort to the fall.  No significant ecchymosis.  The hand is swollen and red.  Also considering cellulitis is a possibility although history provided less suspicious for this.  Plan for plain film of the hand to evaluate for underlying fracture.  Discussed removing the patient's ring and she agrees to allow me to cut it off after discussion regarding possible complications.   10:30 AM  Attempted reduction but MCP appears to be slipping in and out. Patient tolerating well. Exam also consistent with possible cellulitis and questionable felon of the left index finger. Discussed with Dr. Amedeo Plenty with hand surgery. Patient last took her Eliquis at 6 PM  yesterday. He requests transfer to the [facility] for evaluation there and possible open procedure.   Spoke with EDP Dr. Wilson Singer who accepts the patient in transfer to [facility]. [facility] contacted for transport.  ____________________________________________  FINAL CLINICAL IMPRESSION(S) / ED DIAGNOSES  Final diagnoses:  Closed dislocation of metacarpophalangeal joint of finger, initial encounter  Swelling of left hand     MEDICATIONS GIVEN DURING THIS VISIT:  Medications  carvedilol (COREG) tablet 6.25 mg (has no administration in time range)  furosemide (LASIX) tablet 20 mg (has no administration in time range)  potassium chloride (K-DUR) CR tablet 10 mEq (has no administration in time range)  traZODone (DESYREL) tablet 150 mg (has no administration in time range)  lactated ringers infusion ( Intravenous New Bag/Given 11/16/18 1819)  acetaminophen (TYLENOL) tablet 325-650 mg (has no administration in time range)  HYDROcodone-acetaminophen (NORCO/VICODIN) 5-325 MG per tablet 1-2 tablet (has no administration in time range)  HYDROcodone-acetaminophen (NORCO) 7.5-325 MG per tablet 1-2 tablet (has no administration in time range)  morphine 2 MG/ML injection 0.5-1 mg (has no administration in time range)  acetaminophen (TYLENOL) tablet 500 mg (has no administration in time range)  ondansetron (ZOFRAN) tablet 4 mg (has no administration in time range)    Or  ondansetron (ZOFRAN) injection 4 mg (has no administration in time range)  colchicine tablet 0.6 mg (has no administration in time range)  oxyCODONE (Oxy IR/ROXICODONE) 5 MG immediate release tablet (has no administration in time range)  pantoprazole (PROTONIX) EC tablet 40 mg (has no administration in time range)  polyvinyl alcohol (LIQUIFILM TEARS) 1.4 % ophthalmic solution 1 drop (has no administration in time range)  clindamycin (CLEOCIN) IVPB 900 mg (has no administration in time range)  morphine 4 MG/ML injection 4 mg (4 mg Intravenous  Given 11/16/18 0938)  clindamycin (CLEOCIN) IVPB 900 mg (900 mg Intravenous Given 11/16/18 1520)  oxyCODONE (Oxy IR/ROXICODONE) immediate release tablet 5 mg (5 mg Oral Given 11/16/18 1656)    Or  oxyCODONE (ROXICODONE) 5 MG/5ML solution 5 mg ( Oral See Alternative 11/16/18 1656)  clindamycin (CLEOCIN) 900 MG/50ML IVPB (  Override pull for Anesthesia 11/16/18 1520)  bupivacaine (PF) (MARCAINE) 0.25 % injection (has no administration in time range)  propofol (DIPRIVAN) 10 mg/mL bolus/IV push (has no administration in time range)  fentaNYL (SUBLIMAZE) 250 MCG/5ML injection (has no administration in time range)  neomycin-bacitracin-polymyxin (NEOSPORIN) 400-09-4998 ointment packet (has no administration in time range)  phenylephrine 0.4-0.9 MG/10ML-% injection (has no administration in time range)     Note:  This document was prepared using Dragon voice recognition software and may include unintentional dictation errors.  Nanda Quinton, MD Emergency Medicine    Long, [facility], MD 11/16/18 2059

## 2018-11-16 NOTE — ED Triage Notes (Signed)
PT trans from [facility] for surgery . PT personal belongings  In safe  # [id number] yellow copy

## 2018-11-16 NOTE — Consult Note (Signed)
Reason for Consult:Left 3rd MCP joint dislocation Referring Physician: Coralyn Helling  Kimberly [name] is an 85 y.o. female.  HPI: Kimberly [name] was getting out of her tub this morning when she slipped and fell. She's unsure what she hit her hand on but had immediate pain. She went to [facility] where x-rays showed a 3rd MCP joint dislocation. EDP tried unsuccessfully to reduce and so hand surgery was consulted. She was transferred to [facility] for definitive treatment. She is RHD and lives at home alone.  Past Medical History:  Diagnosis Date  . Acute blood loss anemia 10/11/2012  . Acute diverticulitis 08/24/2013  . Acute on chronic combined systolic and diastolic CHF, NYHA class 4 ([place]) 11/15/2013  . Antral ulcer 10/11/2012  . Arrhythmia    atrial fibb  . Atrial fibrillation ([place])   . Cardiomyopathy, nonischemic ([place])   . Chronic anticoagulation 10/12/2012  . CKD (chronic kidney disease) stage 3, GFR 30-59 ml/min (HCC) 10/12/2012  . Depression   . Erosive esophagitis 10/11/2012  . Fibromyalgia   . Glaucoma   . H/O echocardiogram 2007   EF 40-45%,         . Hypertension   . Osteoarthritis   . Pacemaker    Last saw cards 07/2013  . Scoliosis     Past Surgical History:  Procedure Laterality Date  . ABDOMINAL HYSTERECTOMY    . APPENDECTOMY    . BACK SURGERY    . BIOPSY  03/03/2017   Procedure: BIOPSY;  Surgeon: Danie Binder, MD;  Location: [facility];  Service: Endoscopy;;  gastric  . BREAST SURGERY    . CARDIAC CATHETERIZATION  12/08/2005   LAD AND LEFT MAIN WITH NO HIGH-GRADE STENOSIS. MILD DISEASE IN THE CX AND LAD SYSTEM. SEVERE LV DYSFUNCTION WITH DILATION OF THE LV. EF 15-20%. LV END-DIASTOLIC PRESSURE IS 90. +1 MR.  . CHOLECYSTECTOMY    . COLONOSCOPY N/A 03/03/2017   Procedure: COLONOSCOPY;  Surgeon: Danie Binder, MD;  Location: [facility];  Service: Endoscopy;  Laterality: N/A;  . CYSTOSCOPY N/A 02/24/2013   Procedure: CYSTOSCOPY WITH URETHRAL DILITATION;  Surgeon: Marissa Nestle, MD;   Location: [facility] ORS;  Service: Urology;  Laterality: N/A;  . DOPPLER ECHOCARDIOGRAPHY N/A 05/30/2010   LV SIZE IS NORMAL. LV SYSTOLIC FUNCTION IS LOW NORMAL. EF=50-55%. MILD INFERIOR HYPOKINESIS.MILD TO MODERATE POSTERIOR WALL HYPOKINESIS.PACEMAKER LEAD IN THE RV. LA IS MILDLY DILATED. RA IS MODERATE TO SEVERLY DILATED. PACEMAKER LEAD IN THE RA. MILD CALCICICATION OF THE MV APPARATUS. MODERATE MR. MILD TO MODERATE TR. MILD PHTN.AV MILDLY SCLEROTIC.  [name] ESOPHAGOGASTRODUODENOSCOPY N/A 10/13/2012   Dr. Gala Romney: severe ulcerative reflux esophagitis, question of Barrett's but negative path, single deep prepyloric antral ulcer, negative H.pylori  . ESOPHAGOGASTRODUODENOSCOPY N/A 03/03/2017   Procedure: ESOPHAGOGASTRODUODENOSCOPY (EGD);  Surgeon: Danie Binder, MD;  Location[facility];  Service: Endoscopy;  Laterality: N/A;  . HERNIA REPAIR     right inguinal hernia and umbilical  . LOWETR EXT VENOUS Bilateral 11-08-10   R & L- NO EVIDENCE OF THROMBUS OR THROMBOPHLEBITIS. THERE IS MILD AMOUNT OF SUBCUTANEOUS EDEMA NOTED WITHIN THE LEFT CALF AND ANKLE. R & L GSV AND SSV- NO VENOUS INSUFF NOTED.  [name] NECK SURGERY    . NUCLEAR STRESS TEST N/A 02/13/2009   NORMAL PATTERN OF PERFUSION IN ALL REGIONS. POST STRESS VENTICULAR SIZE IS NORMAL. POST  STESS EF 85%.  NORMAL MYOCARDIAL PERFUSION STUDY.  [name] PACEMAKER INSERTION    . POLYPECTOMY  03/03/2017   Procedure:  POLYPECTOMY;  Surgeon: Danie Binder, MD;  Location[facility];  Service: Endoscopy;;  colon  . TONSILLECTOMY    . YAG LASER APPLICATION Bilateral 3/47/4259   Procedure: YAG LASER APPLICATION;  Surgeon: Williams Che, MD;  Location: [facility] ORS;  Service: Ophthalmology;  Laterality: Bilateral;    Family History  Problem Relation Age of Onset  . Cancer Sister   . Asthma Sister   . Heart failure Brother   . Pulmonary embolism Brother   . Cancer Brother   . Colon cancer Neg Hx     Social History:  reports that she quit smoking about 13 years ago.  Her smoking use included cigarettes. She has a 15.00 pack-year smoking history. She has never used smokeless tobacco. She reports current alcohol use. She reports that she does not use drugs.  Allergies:  Allergies  Allergen Reactions  . Ciprofloxacin Other (See Comments)    Possibly caused diarrhea November 2018  . Flagyl [Metronidazole] Other (See Comments)    Possibly caused diarrhea November 2018  . Iron Swelling    Ferrous Sulfate - tongue swelling   . Papaya Derivatives Hives  . Iodine Rash and Other (See Comments)    REACTION:If injected,  Rash/irritated skin reaction "welts"  . Penicillins Hives    DID THE REACTION INVOLVE: Swelling of the face/tongue/throat, SOB, or low BP? Unknown Sudden or severe rash/hives, skin peeling, or the inside of the mouth or nose? Yes Did it require medical treatment? Yes When did it last happen?45 or 85 years old If all above answers are "NO", may proceed with cephalosporin use.  . Sulfa Antibiotics Rash    Medications: I have reviewed the patient's current medications.  Results for orders placed or performed during the hospital encounter of 11/16/18 (from the past 48 hour(s))  Basic metabolic panel     Status: Abnormal   Collection Time: 11/16/18  8:40 AM  Result Value Ref Range   Sodium 139 135 - 145 mmol/L   Potassium 3.4 (L) 3.5 - 5.1 mmol/L   Chloride 104 98 - 111 mmol/L   CO2 25 22 - 32 mmol/L   Glucose, Bld 110 (H) 70 - 99 mg/dL   BUN 20 8 - 23 mg/dL   Creatinine, Ser 1.10 (H) 0.44 - 1.00 mg/dL   Calcium 8.5 (L) 8.9 - 10.3 mg/dL   GFR calc non Af Amer 44 (L) >60 mL/min   GFR calc Af Amer 52 (L) >60 mL/min   Anion gap 10 5 - 15    Comment: Performed at [facility], [address] [place], [place] [address]  CBC with Differential     Status: Abnormal   Collection Time: 11/16/18  8:40 AM  Result Value Ref Range   WBC 9.0 4.0 - 10.5 K/uL   RBC 3.08 (L) 3.87 - 5.11 MIL/uL   Hemoglobin 9.6 (L) 12.0 - 15.0 g/dL   HCT 29.7  (L) 36.0 - 46.0 %   MCV 96.4 80.0 - 100.0 fL   MCH 31.2 26.0 - 34.0 pg   MCHC 32.3 30.0 - 36.0 g/dL   RDW 18.1 (H) 11.5 - 15.5 %   Platelets 174 150 - 400 K/uL   nRBC 0.0 0.0 - 0.2 %   Neutrophils Relative % 76 %   Neutro Abs 6.9 1.7 - 7.7 K/uL   Lymphocytes Relative 13 %   Lymphs Abs 1.2 0.7 - 4.0 K/uL   Monocytes Relative 10 %   Monocytes Absolute 0.9 0.1 - 1.0  K/uL   Eosinophils Relative 1 %   Eosinophils Absolute 0.1 0.0 - 0.5 K/uL   Basophils Relative 0 %   Basophils Absolute 0.0 0.0 - 0.1 K/uL   Immature Granulocytes 0 %   Abs Immature Granulocytes 0.04 0.00 - 0.07 K/uL    Comment: Performed at [facility], [address], [place], [place] [address]  SARS Coronavirus 2 (CEPHEID - Performed in [place] hospital lab), Hosp Order     Status: None   Collection Time: 11/16/18 10:42 AM   Specimen: Nasopharyngeal Swab  Result Value Ref Range   SARS Coronavirus 2 NEGATIVE NEGATIVE    Comment: (NOTE) If result is NEGATIVE SARS-CoV-2 target nucleic acids are NOT DETECTED. The SARS-CoV-2 RNA is generally detectable in upper and lower  respiratory specimens during the acute phase of infection. The lowest  concentration of SARS-CoV-2 viral copies this assay can detect is 250  copies / mL. A negative result does not preclude SARS-CoV-2 infection  and should not be used as the sole basis for treatment or other  patient management decisions.  A negative result may occur with  improper specimen collection / handling, submission of specimen other  than nasopharyngeal swab, presence of viral mutation(s) within the  areas targeted by this assay, and inadequate number of viral copies  (<250 copies / mL). A negative result must be combined with clinical  observations, patient history, and epidemiological information. If result is POSITIVE SARS-CoV-2 target nucleic acids are DETECTED. The SARS-CoV-2 RNA is generally detectable in upper and lower  respiratory specimens dur ing the acute  phase of infection.  Positive  results are indicative of active infection with SARS-CoV-2.  Clinical  correlation with patient history and other diagnostic information is  necessary to determine patient infection status.  Positive results do  not rule out bacterial infection or co-infection with other viruses. If result is PRESUMPTIVE POSTIVE SARS-CoV-2 nucleic acids MAY BE PRESENT.   A presumptive positive result was obtained on the submitted specimen  and confirmed on repeat testing.  While 2019 novel coronavirus  (SARS-CoV-2) nucleic acids may be present in the submitted sample  additional confirmatory testing may be necessary for epidemiological  and / or clinical management purposes  to differentiate between  SARS-CoV-2 and other Sarbecovirus currently known to infect humans.  If clinically indicated additional testing with an alternate test  methodology [phone]) is advised. The SARS-CoV-2 RNA is generally  detectable in upper and lower respiratory sp ecimens during the acute  phase of infection. The expected result is Negative. Fact Sheet for Patients:  StrictlyIdeas.no Fact Sheet for Healthcare Providers: BankingDealers.co.za This test is not yet approved or cleared by the [place] FDA and has been authorized for detection and/or diagnosis of SARS-CoV-2 by FDA under an Emergency Use Authorization (EUA).  This EUA will remain in effect (meaning this test can be used) for the duration of the COVID-19 declaration under Section 564(b)(1) of the Act, 21 U.S.C. section 360bbb-3(b)(1), unless the authorization is terminated or revoked sooner. Performed at [facility], [address] [place], [place] [address]     Dg Hand Complete Left  Result Date: 11/16/2018 CLINICAL DATA:  Status post reduction of left middle finger. EXAM: LEFT HAND - COMPLETE 3+ VIEW COMPARISON:  None. FINDINGS: There appears to be persistent volar dislocation  of the third metacarpophalangeal joint. No definite fracture is noted at this time. Degenerative changes are seen involving the first carpometacarpal and first metacarpophalangeal joints. IMPRESSION: Persistent dislocation of third metacarpophalangeal  joint is noted. Degenerative changes are noted as described above. Electronically Signed   By: Marijo Conception M.D.   On: 11/16/2018 10:14   Dg Hand Complete Left  Result Date: 11/16/2018 CLINICAL DATA:  Pain 4 days ago. EXAM: LEFT HAND - COMPLETE 3+ VIEW COMPARISON:  None. FINDINGS: There is a dislocation at the third MCP joint with overlying soft tissue swelling. There are some soft tissue calcifications adjacent to the third MCP joint. There is some mild irregularity at the base of the third proximal phalanx. On the lateral view, it is difficult to see the metacarpals of all 5 digits. I believe to the metacarpals overlap on the lateral view. No other evidence of fracture or dislocation. Degenerative changes are seen in the carpal bones. There is subluxation at the first MCP joint without dislocation. No other acute abnormalities are identified. IMPRESSION: 1. There is a dislocation at the third MCP joint. Irregularity at the base of the proximal third phalanx could be degenerative or from subtle fracture given the dislocation. There is overlapping soft tissue swelling. Once the dislocation is reduced, recommend repeat imaging to better evaluate the base of the third proximal phalanx in the distal third metacarpal to evaluate for subtle fracture. 2. Degenerative changes. 3. No other acute abnormalities. Electronically Signed   By: Dorise Bullion III M.D   On: 11/16/2018 09:16    Review of Systems  Constitutional: Negative for weight loss.  HENT: Negative for ear discharge, ear pain, hearing loss and tinnitus.   Eyes: Negative for blurred vision, double vision, photophobia and pain.  Respiratory: Negative for cough, sputum production and shortness of  breath.   Cardiovascular: Negative for chest pain.  Gastrointestinal: Negative for abdominal pain, nausea and vomiting.  Genitourinary: Negative for dysuria, flank pain, frequency and urgency.  Musculoskeletal: Positive for joint pain (Left hand). Negative for back pain, falls, myalgias and neck pain.  Neurological: Negative for dizziness, tingling, sensory change, focal weakness, loss of consciousness and headaches.  Endo/Heme/Allergies: Does not bruise/bleed easily.  Psychiatric/Behavioral: Negative for depression, memory loss and substance abuse. The patient is not nervous/anxious.    Blood pressure (!) 150/68, pulse 73, temperature 97.8 F (36.6 C), temperature source Oral, resp. rate 16, height 5\' 7"  (1.702 m), weight 67.6 kg, SpO2 100 %. Physical Exam  Constitutional: She appears well-developed and well-nourished. No distress.  HENT:  Head: Normocephalic and atraumatic.  Eyes: Conjunctivae are normal. Right eye exhibits no discharge. Left eye exhibits no discharge. No scleral icterus.  Neck: Normal range of motion.  Cardiovascular: Normal rate and regular rhythm.  Respiratory: Effort normal. No respiratory distress.  Musculoskeletal:     Comments: Left shoulder, elbow, wrist, digits- no skin wounds, hand edematous, erythematous, mod TTP 3rd MCP joint, no blocks to motion  Sens  Ax/R/M/U intact  Mot   Ax/ R/ PIN/ M/ AIN/ U grossly intact  Rad 2+  Neurological: She is alert.  Skin: Skin is warm and dry. She is not diaphoretic.  Psychiatric: She has a normal mood and affect. Her behavior is normal.    Assessment/Plan: Left 3rd MCP joint dislocation -- To OR for open reduction vs reconstruction by Dr. Amedeo Plenty this afternoon. Continue NPO. Multiple medical problems including CHF, afib on Eliquis, CKD, fibromyalgia, and HTN    Lisette Abu, PA-C Orthopedic Surgery [phone] 11/16/2018, 12:03 PM

## 2018-11-16 NOTE — Anesthesia Procedure Notes (Signed)
Procedure Name: LMA Insertion Date/Time: 11/16/2018 3:36 PM Performed by: Valetta Fuller, CRNA Pre-anesthesia Checklist: Patient identified, Emergency Drugs available, Suction available and Patient being monitored Patient Re-evaluated:Patient Re-evaluated prior to induction Oxygen Delivery Method: Circle system utilized Preoxygenation: Pre-oxygenation with 100% oxygen Induction Type: IV induction Ventilation: Mask ventilation without difficulty LMA: LMA inserted LMA Size: 4.0 Number of attempts: 1 Placement Confirmation: positive ETCO2 Tube secured with: Tape Dental Injury: Teeth and Oropharynx as per pre-operative assessment

## 2018-11-16 NOTE — ED Notes (Signed)
Rings removed by Dr. Laverta Baltimore. Rings given to patient.

## 2018-11-16 NOTE — ED Notes (Signed)
[facility] in room loading pt on stretcher for transport to [facility] ED.

## 2018-11-16 NOTE — Telephone Encounter (Signed)
LVM, reminding pt of her appt on 11-17-18 with Kerin Ransom.

## 2018-11-16 NOTE — Progress Notes (Signed)
Pharmacy Antibiotic Note  Kimberly [name] is a 85 y.o. female admitted on 11/16/2018 with finger dislocation after mechanical fall (traumatic injury to left hand with degenerative joint disease and third MCP subluxation with findings suggestive of traumatic injury and chronic degenerative change with cartilaginous damage).  Pt is S/P open third MCP arthrotomy, synovectomy and repair, reconstruction of subluxation of MCP joint). Surgical cx were sent. Pharmacy has been consulted for clindamycin dosing.  Medical history includes: a fib, HTN, depression, CKD stage 3, chronic diastolic HF, complete heart block, hx of bipolar disorder.  Plan: 85 year old female S/P MCP surgery, continuing on clindamycin pending results of surgical cx. Continue clindamycin 900 mg IV Q 8 hrs. Monitor clinical improvement, WBC, temp, length of therapy.  Height: 5\' 7"  (170.2 cm) Weight: 149 lb (67.6 kg) IBW/kg (Calculated) : 61.6  Temp (24hrs), Avg:98 F (36.7 C), Min:97.4 F (36.3 C), Max:98.4 F (36.9 C)  Recent Labs  Lab 11/10/18 1800 11/16/18 0840  WBC  --  9.0  CREATININE 1.18* 1.10*    Estimated Creatinine Clearance: 33.7 mL/min (A) (by C-G formula based on SCr of 1.1 mg/dL (H)).    Allergies  Allergen Reactions  . Ciprofloxacin Other (See Comments)    Possibly caused diarrhea November 2018  . Flagyl [Metronidazole] Other (See Comments)    Possibly caused diarrhea November 2018  . Iron Swelling    Ferrous Sulfate - tongue swelling   . Papaya Derivatives Hives  . Iodine Rash and Other (See Comments)    REACTION:If injected,  Rash/irritated skin reaction "welts"  . Penicillins Hives    DID THE REACTION INVOLVE: Swelling of the face/tongue/throat, SOB, or low BP? Unknown Sudden or severe rash/hives, skin peeling, or the inside of the mouth or nose? Yes Did it require medical treatment? Yes When did it last happen?72 or 85 years old If all above answers are "NO", may proceed with cephalosporin  use.  . Sulfa Antibiotics Rash    Antimicrobials this admission: 7/14 clindamycin 900 mg IV Pre-op X 1   Microbiology results: 7/14 surgical cx of finger X 2: pending 7/14 COVID: negative  Thank you for allowing pharmacy to be a part of this patient's care.  Gillermina Hu, PharmD, BCPS, [facility] Clinical Pharmacist 11/16/2018 6:29 PM

## 2018-11-16 NOTE — Transfer of Care (Signed)
Immediate Anesthesia Transfer of Care Note  Patient: Kimberly [name]  Procedure(s) Performed: MIDDLE FINGER OPEN REDUCTION VERSUS RECONSTRUCTION (Left Finger)  Patient Location: PACU  Anesthesia Type:General  Level of Consciousness: awake, alert  and oriented  Airway & Oxygen Therapy: Patient Spontanous Breathing  Post-op Assessment: Report given to RN and Post -op Vital signs reviewed and stable  Post vital signs: Reviewed and stable  Last Vitals:  Vitals Value Taken Time  BP 118/62 11/16/18 1626  Temp 36.9 C 11/16/18 1626  Pulse 73 11/16/18 1627  Resp 20 11/16/18 1627  SpO2 97 % 11/16/18 1627  Vitals shown include unvalidated device data.  Last Pain:  Vitals:   11/16/18 1331  TempSrc:   PainSc: 5          Complications: No apparent anesthesia complications

## 2018-11-16 NOTE — ED Notes (Signed)
Xray notified rings were removed.

## 2018-11-16 NOTE — ED Provider Notes (Signed)
Patient sent from [facility] for further evaluation of a left hand injury.  Several days ago, the patient fell in the bathroom and injured her hand.  X-rays show a dislocation of the third MCP joint.  Attempt at reduction was unsuccessful and joint appears unstable.  This injury was discussed between Dr. Amedeo Plenty and Dr. Laverta Baltimore and the decision was made to transfer the patient here for possible open reduction.    Patient arrives here with stable vital signs and appears comfortable.  I have spoken with Orion Crook from orthopedics and patient will likely go to the OR for intervention.   Veryl Speak, MD 11/16/18 1241

## 2018-11-16 NOTE — Op Note (Signed)
Operative note 11/16/2018  Roseanne Kaufman MD.  Diagnosis traumatic injury left hand with degenerative joint disease and third MCP subluxation with findings suggestive of traumatic injury and chronic degenerative change with cartilaginous damage.  Postop diagnosis the same  Operative procedure open third MCP arthrotomy synovectomy and repair reconstruction subluxation MCP joint.  Surgeon Roseanne Kaufman MD  anesthesia General  tourniquet time-less than 20 minutes  Intraoperative findings: This patient had significant synovitis in her joint with gout type crystalline deposits and findings of chronic injury and subluxation as well as chondral loss.  I would favor a traumatic injury to the MCP joint with a pre-existing subluxation which resulted in further subluxation with the injury today and worsening with inflammatory arthritis being present.  Intraoperative cultures were taken and gout crystals sent  Operative procedure: Patient was seen by myself and anesthesia taken to the operative theater underwent a general anesthetic.  She was counseled extensively preoperatively.  I discussed with the patient I felt that part of her problem was pre-existing arthritis and part was the traumatic injury.  Interestingly the index and small fingers are also quite tender and problematic.  I reviewed these issues with her at length and the findings.  The operation commenced with Hibiclens scrub followed by a formal sterile Hibiclens scrub.  Sterile field was secured and timeout was observed.  The general anesthetic was in excellent working fashion.  Following this I made a small approach dorsally over the finger dissected down split the extensor midline and evaluated the joint the patient had a creamy type discharge from the MCP joint left third MCP.  This was cultured and gout crystals sent.  I would favor diagnosis of crystalline arthropathy with worsening subluxation and dislocation due to the recent injury.   Following this we performed arthrotomy synovectomy and irrigated aggressively.  Given the chondral change and pre-existing subluxation this was not a situation where one would simply put the joint back into place and except for expected stability.  Instead this is a situation we will were trying to give her pain control rid her of the traumatic injury pain process and hopefully live with subluxation in a reasonable fashion.  1 could certainly consider fusions or other aggressive intervention in the future but certainly for today's purposes my concern is the inflammatory issue and traumatic issue.  This time we irrigated copiously and closed the wound with Vicryl stitch in the capsule followed by running FiberWire to close extensor apparatus and Prolene stitch dorsally to close the wound.  Adaptic Xeroform and Neosporin were placed about the arm she was placed in a sterile dressing and a volar splint.  Should be admitted and will continue IV antibiotics as well as observe the cultures quite closely.,  Placed on a daily dose of colchicine given the intraoperative findings and await the crystalline analysis.  All questions have been encouraged and answered.  She was stable talkative and tolerated the procedure well at its conclusion after extubation.  Roseanne Kaufman MD

## 2018-11-16 NOTE — ED Triage Notes (Signed)
Report called to Pre op.

## 2018-11-16 NOTE — ED Triage Notes (Signed)
Pt slipped and fell in bathroom 4 days ago. Pt hit hand on bathtub. Pt's LT hand noted to be swollen and red. Pt has two rings on, but refuses to have them removed.

## 2018-11-16 NOTE — H&P (Signed)
History and Physical:    Kimberly [name]   DJS:[id number] DOB: Mar 04, 1927 DOA: 11/16/2018  Referring MD/provider: Dr Amedeo Plenty PCP: [name], DO   Patient coming from: Home  Chief Complaint: Mechanical fall and finger dislocation   History of Present Illness:   Kimberly [name] is an 85 y.o. female with past medical history significant for hypertension, atrial fibrillation on Eliquis, diastolic dysfunction and bipolar disorder who was in her usual state of health until Saturday when she slipped and fell as she was getting out of her Jacuzzi tub.  She had immediate pain in her left hand which she thought would improve however it did not so she came in for evaluation.  In the ED patient was noted to have a dislocation of the third MCP.  Patient was taken to the OR and underwent an open third MCP synovectomy and repair reconstruction.  Patient states that other than her hand throbbing she feels well.  She denies feeling dizzy, having chest pain or shortness of breath.  Patient notes that the reason she fell on Saturday is because "the towel I put to stand on when I get out of my Jacuzzi tub slipped".  Patient denies any confusion, headache, head trauma or any other symptoms.  Patient's daughter Santiago Glad had spoken with the orthopedic PA Legrand Como and noted that she was concerned about her mother having competence to take care of herself.  When I asked her if she was able to take care of herself Ms. Shiveley said she had absolutely no difficulty taking care of herself and felt that she could manage it even with a left hand bandaged.  When I spoke with patient's daughter Santiago Glad, she said that she was not concerned about competence but was worried that her mother might be manic.  She also noted that she was concerned that her mother refused to take medications for mania although did take medications for depression.  They have been having a longstanding conversation about this.  I assured Santiago Glad that  patient did not appear to be manic in any way and that she was coherent, cooperative and logical.  ED Course:  The patient was seen by Dr. Amedeo Plenty and taken to the OR.  [facility] is requested to admit due to patient's daughter's concerns that the patient might be manic.  ROS:   ROS   Review of Systems: General: No fever, chills, weight changes Skin: No rashes, lesions, wounds Endocrine: no heat/cold intolerance, no polyuria Respiratory: No cough,, shortness of breath, hemoptysis Cardiovascular: No palpitations, chest pain GI: No nausea, vomiting, diarrhea, constipation GU: No dysuria, increased frequency CNS: No numbness, dizziness, headache   Past Medical History:   Past Medical History:  Diagnosis Date  . Acute blood loss anemia 10/11/2012  . Acute diverticulitis 08/24/2013  . Acute on chronic combined systolic and diastolic CHF, NYHA class 4 ([place]) 11/15/2013  . Antral ulcer 10/11/2012  . Arrhythmia    atrial fibb  . Atrial fibrillation ([place])   . Cardiomyopathy, nonischemic ([place])   . Chronic anticoagulation 10/12/2012  . CKD (chronic kidney disease) stage 3, GFR 30-59 ml/min ([facility]) 10/12/2012  . Depression   . Erosive esophagitis 10/11/2012  . Fibromyalgia   . Glaucoma   . H/O echocardiogram 2007   EF 40-45%,         . Hypertension   . Osteoarthritis   . Pacemaker    Last saw cards 07/2013  . Scoliosis     Past Surgical History:  Past Surgical History:  Procedure Laterality Date  . ABDOMINAL HYSTERECTOMY    . APPENDECTOMY    . BACK SURGERY    . BIOPSY  03/03/2017   Procedure: BIOPSY;  Surgeon: Danie Binder, MD;  Location: [facility];  Service: Endoscopy;;  gastric  . BREAST SURGERY    . CARDIAC CATHETERIZATION  12/08/2005   LAD AND LEFT MAIN WITH NO HIGH-GRADE STENOSIS. MILD DISEASE IN THE CX AND LAD SYSTEM. SEVERE LV DYSFUNCTION WITH DILATION OF THE LV. EF 15-20%. LV END-DIASTOLIC PRESSURE IS 90. +1 MR.  . CHOLECYSTECTOMY    . COLONOSCOPY N/A 03/03/2017   Procedure:  COLONOSCOPY;  Surgeon: Danie Binder, MD;  Location: [facility];  Service: Endoscopy;  Laterality: N/A;  . CYSTOSCOPY N/A 02/24/2013   Procedure: CYSTOSCOPY WITH URETHRAL DILITATION;  Surgeon: Marissa Nestle, MD;  Location: [facility];  Service: Urology;  Laterality: N/A;  . DOPPLER ECHOCARDIOGRAPHY N/A 05/30/2010   LV SIZE IS NORMAL. LV SYSTOLIC FUNCTION IS LOW NORMAL. EF=50-55%. MILD INFERIOR HYPOKINESIS.MILD TO MODERATE POSTERIOR WALL HYPOKINESIS.PACEMAKER LEAD IN THE RV. LA IS MILDLY DILATED. RA IS MODERATE TO SEVERLY DILATED. PACEMAKER LEAD IN THE RA. MILD CALCICICATION OF THE MV APPARATUS. MODERATE MR. MILD TO MODERATE TR. MILD PHTN.AV MILDLY SCLEROTIC.  [name] ESOPHAGOGASTRODUODENOSCOPY N/A 10/13/2012   Dr. Gala Romney: severe ulcerative reflux esophagitis, question of Barrett's but negative path, single deep prepyloric antral ulcer, negative H.pylori  . ESOPHAGOGASTRODUODENOSCOPY N/A 03/03/2017   Procedure: ESOPHAGOGASTRODUODENOSCOPY (EGD);  Surgeon: Danie Binder, MD;  [facility];  Service: Endoscopy;  Laterality: N/A;  . HERNIA REPAIR     right inguinal hernia and umbilical  . LOWETR EXT VENOUS Bilateral 11-08-10   R & L- NO EVIDENCE OF THROMBUS OR THROMBOPHLEBITIS. THERE IS MILD AMOUNT OF SUBCUTANEOUS EDEMA NOTED WITHIN THE LEFT CALF AND ANKLE. R & L GSV AND SSV- NO VENOUS INSUFF NOTED.  [name] NECK SURGERY    . NUCLEAR STRESS TEST N/A 02/13/2009   NORMAL PATTERN OF PERFUSION IN ALL REGIONS. POST STRESS VENTICULAR SIZE IS NORMAL. POST  STESS EF 85%.  NORMAL MYOCARDIAL PERFUSION STUDY.  [name] PACEMAKER INSERTION    . POLYPECTOMY  03/03/2017   Procedure: POLYPECTOMY;  Surgeon: Danie Binder, MD;  Location[facility];  Service: Endoscopy;;  colon  . TONSILLECTOMY    . YAG LASER APPLICATION Bilateral 9/50/9326   Procedure: YAG LASER APPLICATION;  Surgeon: Williams Che, MD;  Location: [facility];  Service: Ophthalmology;  Laterality: Bilateral;    Social History:   Social History    Socioeconomic History  . Marital status: Widowed    Spouse name: Not on file  . Number of children: Not on file  . Years of education: Not on file  . Highest education level: Some college, no degree  Occupational History  . 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 Financial resource strain: Not very hard  . Food insecurity    Worry: Never true    Inability: Never true  . Transportation needs    Medical: No    Non-medical: No  Tobacco Use  . Smoking status: Former Smoker    Packs/day: 0.50    Years: 30.00    Pack years: 15.00    Types: Cigarettes    Quit date: 12/13/2004    Years since quitting: 13.9  . Smokeless tobacco: Never Used  . Tobacco comment: former smoker  Substance and Sexual Activity  .  Alcohol use: Yes    Alcohol/week: 0.0 standard drinks    Comment: history of drinking a 1/2 glass of wine in the evening  . Drug use: No  . Sexual activity: Never  Lifestyle  . Physical activity    Days per week: 0 days    Minutes per session: Not on file  . Stress: Only a little  Relationships  . Social Herbalist on phone: Once a week    Gets together: Never    Attends religious service: Never    Active member of club or organization: No    Attends meetings of clubs or organizations: Never    Relationship status: Widowed  . Intimate partner violence    Fear of current or ex partner: No    Emotionally abused: No    Physically abused: No    Forced sexual activity: No  Other Topics Concern  . Not on file  Social History Narrative   No home exercise program. PT ordered this week.    Allergies   Ciprofloxacin, Flagyl [metronidazole], Iron, Papaya derivatives, Iodine, Penicillins, and Sulfa antibiotics  Family history:   Family History  Problem Relation Age of Onset  . Cancer Sister   . Asthma Sister   . Heart failure Brother   . Pulmonary embolism Brother   . Cancer Brother   . Colon cancer Neg Hx      Current Medications:   Prior to Admission medications   Medication Sig Start Date End Date Taking? Authorizing Provider  acetaminophen (TYLENOL) 325 MG tablet Take 2 tablets (650 mg total) by mouth every 6 (six) hours as needed for mild pain, fever or headache (or Fever >/= 101). 10/17/18  Yes Emokpae, Courage, MD  apixaban (ELIQUIS) 2.5 MG TABS tablet Take 1 tablet (2.5 mg total) by mouth 2 (two) times daily. Restart on 11/01/18 11/01/18  Yes Tat, Shanon Brow, MD  carvedilol (COREG) 6.25 MG tablet Take 1 tablet (6.25 mg total) by mouth 2 (two) times daily with a meal. 10/17/18  Yes Emokpae, Courage, MD  furosemide (LASIX) 20 MG tablet Take 1 tablet (20 mg total) by mouth daily. 10/17/18  Yes Emokpae, Courage, MD  Multiple Vitamins-Minerals (PRESERVISION AREDS 2) CAPS Take 1 capsule by mouth 2 (two) times daily.    Yes [provider]  omeprazole (PRILOSEC OTC) 20 MG tablet Take 1 tablet (20 mg total) by mouth daily. 10/17/18  Yes Emokpae, Courage, MD  Polyethyl Glycol-Propyl Glycol (SYSTANE ULTRA) 0.4-0.3 % SOLN Place 1-2 drops into both eyes 2 (two) times daily as needed (dry eyes).    Yes [provider]  potassium chloride (K-DUR) 10 MEQ tablet Take 1 tablet (10 mEq total) by mouth daily. 11/12/18  Yes Gottschalk, Leatrice Jewels M, DO  traZODone (DESYREL) 150 MG tablet Take 1 tablet (150 mg total) by mouth at bedtime. 10/17/18  Yes Roxan Hockey, MD    Physical Exam:   Vitals:   11/16/18 1710 11/16/18 1715 11/16/18 1725 11/16/18 1730  BP: 125/64  121/63   Pulse: 72 69 73 70  Resp: 19 16 13 20   Temp:    98.4 F (36.9 C)  TempSrc:      SpO2: 94% 98% 95% 96%  Weight:      Height:         Physical Exam: Blood pressure 121/63, pulse 70, temperature 98.4 F (36.9 C), resp. rate 20, height 5\' 7"  (1.702 m), weight 67.6 kg, SpO2 96 %. Gen: Pleasant well-appearing female sitting up in  stretcher in PACU in no acute distress with bandage left arm. Eyes: Sclerae anicteric. Conjunctiva  mildly injected. Chest: Moderately good air entry bilaterally with no adventitious sounds.  CV: Distant, regular, no audible murmurs. Abdomen: NABS, soft, nondistended, nontender. No tenderness to light or deep palpation. No rebound, no guarding. Extremities: 1+ edema bilateral lower extremities.   Neuro: Alert and oriented times 3; grossly nonfocal. Psych: Patient is cooperative, logical and coherent with appropriate mood and affect.  Patient expresses no no evidence of mania.  She is speaking clearly, slowly, with normal tone and content.  Her cognition demonstrates no loosening of association or flight of ideas.   Data Review:    Labs: Basic Metabolic Panel: Recent Labs  Lab 11/10/18 1800 11/16/18 0840  NA 137 139  K 3.3* 3.4*  CL 103 104  CO2 21* 25  GLUCOSE 102* 110*  BUN 21 20  CREATININE 1.18* 1.10*  CALCIUM 8.2* 8.5*   Liver Function Tests: No results for input(s): AST, ALT, ALKPHOS, BILITOT, PROT, ALBUMIN in the last 168 hours. No results for input(s): LIPASE, AMYLASE in the last 168 hours. No results for input(s): AMMONIA in the last 168 hours. CBC: Recent Labs  Lab 11/16/18 0840  WBC 9.0  NEUTROABS 6.9  HGB 9.6*  HCT 29.7*  MCV 96.4  PLT 174   Cardiac Enzymes: No results for input(s): CKTOTAL, CKMB, CKMBINDEX, TROPONINI in the last 168 hours.  BNP (last 3 results) No results for input(s): PROBNP in the last 8760 hours. CBG: No results for input(s): GLUCAP in the last 168 hours.  Urinalysis    Component Value Date/Time   COLORURINE AMBER (A) 10/23/2018 0910   APPEARANCEUR CLOUDY (A) 10/23/2018 0910   LABSPEC 1.013 10/23/2018 0910   PHURINE 6.0 10/23/2018 0910   GLUCOSEU NEGATIVE 10/23/2018 0910   HGBUR MODERATE (A) 10/23/2018 0910   BILIRUBINUR NEGATIVE 10/23/2018 0910   BILIRUBINUR neg 04/25/2014 0855   KETONESUR 5 (A) 10/23/2018 0910   PROTEINUR NEGATIVE 10/23/2018 0910   UROBILINOGEN 0.2 05/26/2014 1415   NITRITE NEGATIVE 10/23/2018 0910    LEUKOCYTESUR MODERATE (A) 10/23/2018 0910      Radiographic Studies: Dg Hand Complete Left  Result Date: 11/16/2018 CLINICAL DATA:  Status post reduction of left middle finger. EXAM: LEFT HAND - COMPLETE 3+ VIEW COMPARISON:  None. FINDINGS: There appears to be persistent volar dislocation of the third metacarpophalangeal joint. No definite fracture is noted at this time. Degenerative changes are seen involving the first carpometacarpal and first metacarpophalangeal joints. IMPRESSION: Persistent dislocation of third metacarpophalangeal joint is noted. Degenerative changes are noted as described above. Electronically Signed   By: Marijo Conception M.D.   On: 11/16/2018 10:14   Dg Hand Complete Left  Result Date: 11/16/2018 CLINICAL DATA:  Pain 4 days ago. EXAM: LEFT HAND - COMPLETE 3+ VIEW COMPARISON:  None. FINDINGS: There is a dislocation at the third MCP joint with overlying soft tissue swelling. There are some soft tissue calcifications adjacent to the third MCP joint. There is some mild irregularity at the base of the third proximal phalanx. On the lateral view, it is difficult to see the metacarpals of all 5 digits. I believe to the metacarpals overlap on the lateral view. No other evidence of fracture or dislocation. Degenerative changes are seen in the carpal bones. There is subluxation at the first MCP joint without dislocation. No other acute abnormalities are identified. IMPRESSION: 1. There is a dislocation at the third MCP joint. Irregularity at the base of  the proximal third phalanx could be degenerative or from subtle fracture given the dislocation. There is overlapping soft tissue swelling. Once the dislocation is reduced, recommend repeat imaging to better evaluate the base of the third proximal phalanx in the distal third metacarpal to evaluate for subtle fracture. 2. Degenerative changes. 3. No other acute abnormalities. Electronically Signed   By: Dorise Bullion III M.D   On: 11/16/2018  09:16    EKG: Independently reviewed.  Paced rhythm at 100.   Assessment/Plan:   Principal Problem:   Finger dislocation, initial encounter Active Problems:   Permanent atrial fibrillation   Essential hypertension   Depression   CKD (chronic kidney disease) stage 3, GFR 30-59 ml/min (HCC)   Chronic anticoagulation   Chronic diastolic heart failure ([facility]CC)   Complete heart block ([facility])   History of bipolar disorder   Bipolar affective disorder ([facility])   Hand trauma, left, initial encounter  85 year old female who slipped and fell and dislocated her finger on Saturday is status post open reduction earlier today and is admitted to medicine because of concerns of her daughter that she may be manic.  Patient denies any acute medical concerns other than her finger pain.  Patient is no evidence of mania on exam.  FINGER DISLOCATION Status post reduction per orthopedics Anticoagulants can be restarted per their recommendations  HTN BP reasonably controlled Continue carvedilol and Lasix  AFIB Rate is reasonably controlled, continue carvedilol  Eliquis can be restarted per orthopedic recommendations  HFpEF No evidence of acute decompensation Continue Lasix and carvedilol  BIPOLAR DISORDER Patient is only on trazodone for psychiatric medications Patient's daughter expressed concerns that she has been manic a couple of times this year.  I assured her that her mother did not seem to be in a manic state when I saw her today and she said she was relieved. She would like for patient to be referred for outpatient psychiatry if possible upon discharge.  CKD Creatinine is at baseline Avoid renal toxic medications       Other information:   DVT prophylaxis: On Eliquis Code Status: Full code. Family Communication: Spoke with patient's daughter Santiago Glad Disposition Plan: Home Consults called: Orthopedics Admission status: Observation    The medical decision making is of moderate  complexity, therefore this is a level 2 visit.  Dewaine Oats [place]  [facility]  If 7PM-7AM, please contact night-coverage [email or web address] Password [facility] 11/16/2018, 5:46 PM

## 2018-11-17 ENCOUNTER — Encounter (HOSPITAL_COMMUNITY): Payer: Self-pay | Admitting: Orthopedic Surgery

## 2018-11-17 ENCOUNTER — Ambulatory Visit: Payer: Medicare Other | Admitting: Cardiology

## 2018-11-17 DIAGNOSIS — S63269A Dislocation of metacarpophalangeal joint of unspecified finger, initial encounter: Secondary | ICD-10-CM

## 2018-11-17 LAB — BASIC METABOLIC PANEL
Anion gap: 10 (ref 5–15)
BUN: 22 mg/dL (ref 8–23)
CO2: 23 mmol/L (ref 22–32)
Calcium: 8.6 mg/dL — ABNORMAL LOW (ref 8.9–10.3)
Chloride: 102 mmol/L (ref 98–111)
Creatinine, Ser: 1.25 mg/dL — ABNORMAL HIGH (ref 0.44–1.00)
GFR calc Af Amer: 44 mL/min — ABNORMAL LOW (ref >60)
GFR calc non Af Amer: 38 mL/min — ABNORMAL LOW (ref >60)
Glucose, Bld: 131 mg/dL — ABNORMAL HIGH (ref 70–99)
Potassium: 4.2 mmol/L (ref 3.5–5.1)
Sodium: 135 mmol/L (ref 135–145)

## 2018-11-17 LAB — CBC
HCT: 29.3 % — ABNORMAL LOW (ref 36.0–46.0)
Hemoglobin: 9.4 g/dL — ABNORMAL LOW (ref 12.0–15.0)
MCH: 31.2 pg (ref 26.0–34.0)
MCHC: 32.1 g/dL (ref 30.0–36.0)
MCV: 97.3 fL (ref 80.0–100.0)
Platelets: 157 10*3/uL (ref 150–400)
RBC: 3.01 MIL/uL — ABNORMAL LOW (ref 3.87–5.11)
RDW: 17.7 % — ABNORMAL HIGH (ref 11.5–15.5)
WBC: 7.5 10*3/uL (ref 4.0–10.5)
nRBC: 0 % (ref 0.0–0.2)

## 2018-11-17 NOTE — Consult Note (Signed)
Patient is stable this morning.  We will await the culture.  I would recommend maximum elevation IV antibiotics and await cultures given the intraoperative findings and the cellulitic changes present yesterday.  I will look comprehensively at the hand tomorrow with possible dressing change although there is no rush as long as there is no worsening ascending cellulitis.  I certainly appreciate the consultants.  I discussed all issues with the patient and her daughter.  At present juncture she had a significant traumatic injury with cellulitic change and worsening subluxation to the MCP joint/dislocating features.  This underwent irrigation and debridement and the intraoperative findings were suspicious for infection/gout type activity within the joint.  I feel this likely gives a answer for why her fingers are so swollen about the index and small finger in terms of the cellulitic change and possible gouty influence.  We did send crystals.  We are awaiting these.  Initial Gram stain shows no organism which is not unsurprising.  I will continue close observation.  Malon Siddall MD

## 2018-11-17 NOTE — Plan of Care (Signed)

## 2018-11-17 NOTE — Anesthesia Postprocedure Evaluation (Signed)
Anesthesia Post Note  Patient: Kimberly [name]  Procedure(s) Performed: MIDDLE FINGER OPEN REDUCTION VERSUS RECONSTRUCTION (Left Finger)     Patient location during evaluation: PACU Anesthesia Type: General Level of consciousness: awake and alert Pain management: pain level controlled Vital Signs Assessment: post-procedure vital signs reviewed and stable Respiratory status: spontaneous breathing, nonlabored ventilation, respiratory function stable and patient connected to nasal cannula oxygen Cardiovascular status: blood pressure returned to baseline and stable Postop Assessment: no apparent nausea or vomiting Anesthetic complications: no    Last Vitals:  Vitals:   11/16/18 2124 11/17/18 0416  BP: (!) 113/55 118/66  Pulse: 69 71  Resp: 17 17  Temp: 37 C 36.5 C  SpO2: 100% 98%    Last Pain:  Vitals:   11/17/18 0416  TempSrc: Oral  PainSc:                  [place]

## 2018-11-17 NOTE — Progress Notes (Signed)
PROGRESS NOTE    Kimberly [name]  SHF:[id number] DOB: 21-Oct-1926 DOA: 11/16/2018 PCP: [name], DO    Brief Narrative:  85 y/o female who fell and dislocated her finger was admitted to the hospital for further management. She was seen by hand surgery and underwent repair. She is on IV antibiotics for cellulitis in finger.   Assessment & Plan:   Principal Problem:   Finger dislocation, initial encounter Active Problems:   Permanent atrial fibrillation   Essential hypertension   Depression   CKD (chronic kidney disease) stage 3, GFR 30-59 ml/min (HCC)   Chronic anticoagulation   Chronic diastolic heart failure (HCC)   Complete heart block (HCC)   History of bipolar disorder   Bipolar affective disorder (HCC)   Hand trauma, left, initial encounter   1. Finger dislocation and cellulitis. S/p repair reconstruction by ortho. She is on IV clindamycin for cellulitis.  2. HTN. Stable. Continue carvedilol and lasix 3. A fib. Rate is controlled on carvedilol. Restart on eliquis when ok with orthopedics 4. Chronic diastolic CHF. No complains of shortness of breath. Continue on home dose of lasix 5. CKD 3. Creatinine is near baseline. Continue to monitor. 6. Bipolar disorder. Appears stable at this time. Continue on trazodone. Will need outpatient follow up with pyschiatry   DVT prophylaxis: SCDs Code Status: full code Family Communication: discussed with patient.  Disposition Plan: discharge home once improved   Consultants:   Hand surgery, Dr. Amedeo Plenty  Procedures:  open third MCP arthrotomy synovectomy and repair reconstruction subluxation MCP joint.  Antimicrobials:   Clindamycin 7/14>    Subjective: Pain is controlled, no shortness of breath  Objective: Vitals:   11/16/18 2124 11/17/18 0416 11/17/18 0926 11/17/18 1410  BP: (!) 113/55 118/66 116/67 118/64  Pulse: 69 71 69 70  Resp: 17 17 16 16   Temp: 98.6 F (37 C) 97.7 F (36.5 C) 97.6 F (36.4 C) 97.8  F (36.6 C)  TempSrc: Oral Oral Oral Oral  SpO2: 100% 98% 98% 98%  Weight:      Height:        Intake/Output Summary (Last 24 hours) at 11/17/2018 1858 Last data filed at 11/17/2018 1411 Gross per 24 hour  Intake 1338.13 ml  Output -  Net 1338.13 ml   Filed Weights   11/16/18 0811  Weight: 67.6 kg    Examination:  General exam: Appears calm and comfortable  Respiratory system: Clear to auscultation. Respiratory effort normal. Cardiovascular system: S1 & S2 heard, RRR. No JVD, murmurs, rubs, gallops or clicks.  Gastrointestinal system: Abdomen is nondistended, soft and nontender. No organomegaly or masses felt. Normal bowel sounds heard. Central nervous system: Alert and oriented. No focal neurological deficits. Extremities: left hand in dressing, bilateral lower extremities 1+ edema Skin: No rashes, lesions or ulcers Psychiatry: Judgement and insight appear normal. Mood & affect appropriate.     Data Reviewed: I have personally reviewed following labs and imaging studies  CBC: Recent Labs  Lab 11/16/18 0840 11/17/18 0348  WBC 9.0 7.5  NEUTROABS 6.9  --   HGB 9.6* 9.4*  HCT 29.7* 29.3*  MCV 96.4 97.3  PLT 174 502   Basic Metabolic Panel: Recent Labs  Lab 11/16/18 0840 11/17/18 0348  NA 139 135  K 3.4* 4.2  CL 104 102  CO2 25 23  GLUCOSE 110* 131*  BUN 20 22  CREATININE 1.10* 1.25*  CALCIUM 8.5* 8.6*   GFR: Estimated Creatinine Clearance: 29.7 mL/min (A) (by C-G formula based  on SCr of 1.25 mg/dL (H)). Liver Function Tests: No results for input(s): AST, ALT, ALKPHOS, BILITOT, PROT, ALBUMIN in the last 168 hours. No results for input(s): LIPASE, AMYLASE in the last 168 hours. No results for input(s): AMMONIA in the last 168 hours. Coagulation Profile: No results for input(s): INR, PROTIME in the last 168 hours. Cardiac Enzymes: No results for input(s): CKTOTAL, CKMB, CKMBINDEX, TROPONINI in the last 168 hours. BNP (last 3 results) No results for  input(s): PROBNP in the last 8760 hours. HbA1C: No results for input(s): HGBA1C in the last 72 hours. CBG: No results for input(s): GLUCAP in the last 168 hours. Lipid Profile: No results for input(s): CHOL, HDL, LDLCALC, TRIG, CHOLHDL, LDLDIRECT in the last 72 hours. Thyroid Function Tests: No results for input(s): TSH, T4TOTAL, FREET4, T3FREE, THYROIDAB in the last 72 hours. Anemia Panel: No results for input(s): VITAMINB12, FOLATE, FERRITIN, TIBC, IRON, RETICCTPCT in the last 72 hours. Sepsis Labs: No results for input(s): PROCALCITON, LATICACIDVEN in the last 168 hours.  Recent Results (from the past 240 hour(s))  SARS Coronavirus 2 (CEPHEID - Performed in [place] hospital lab), Hosp Order     Status: None   Collection Time: 11/16/18 10:42 AM   Specimen: Nasopharyngeal Swab  Result Value Ref Range Status   SARS Coronavirus 2 NEGATIVE NEGATIVE Final    Comment: (NOTE) If result is NEGATIVE SARS-CoV-2 target nucleic acids are NOT DETECTED. The SARS-CoV-2 RNA is generally detectable in upper and lower  respiratory specimens during the acute phase of infection. The lowest  concentration of SARS-CoV-2 viral copies this assay can detect is 250  copies / mL. A negative result does not preclude SARS-CoV-2 infection  and should not be used as the sole basis for treatment or other  patient management decisions.  A negative result may occur with  improper specimen collection / handling, submission of specimen other  than nasopharyngeal swab, presence of viral mutation(s) within the  areas targeted by this assay, and inadequate number of viral copies  (<250 copies / mL). A negative result must be combined with clinical  observations, patient history, and epidemiological information. If result is POSITIVE SARS-CoV-2 target nucleic acids are DETECTED. The SARS-CoV-2 RNA is generally detectable in upper and lower  respiratory specimens dur ing the acute phase of infection.  Positive   results are indicative of active infection with SARS-CoV-2.  Clinical  correlation with patient history and other diagnostic information is  necessary to determine patient infection status.  Positive results do  not rule out bacterial infection or co-infection with other viruses. If result is PRESUMPTIVE POSTIVE SARS-CoV-2 nucleic acids MAY BE PRESENT.   A presumptive positive result was obtained on the submitted specimen  and confirmed on repeat testing.  While 2019 novel coronavirus  (SARS-CoV-2) nucleic acids may be present in the submitted sample  additional confirmatory testing may be necessary for epidemiological  and / or clinical management purposes  to differentiate between  SARS-CoV-2 and other Sarbecovirus currently known to infect humans.  If clinically indicated additional testing with an alternate test  methodology [phone]) is advised. The SARS-CoV-2 RNA is generally  detectable in upper and lower respiratory sp ecimens during the acute  phase of infection. The expected result is Negative. Fact Sheet for Patients:  StrictlyIdeas.no Fact Sheet for Healthcare Providers: BankingDealers.co.za This test is not yet approved or cleared by the [place] FDA and has been authorized for detection and/or diagnosis of SARS-CoV-2 by FDA under an Emergency Use Authorization (  EUA).  This EUA will remain in effect (meaning this test can be used) for the duration of the COVID-19 declaration under Section 564(b)(1) of the Act, 21 U.S.C. section 360bbb-3(b)(1), unless the authorization is terminated or revoked sooner. Performed at [facility], [address], [place], [place] [address]   Aerobic/Anaerobic Culture (surgical/deep wound)     Status: None (Preliminary result)   Collection Time: 11/16/18  4:00 PM   Specimen: Soft Tissue, Other  Result Value Ref Range Status   Specimen Description SYNOVIAL LEFT HAND  Final   Special Requests  FINGER DISLOCATION  Final   Gram Stain   Final    ABUNDANT WBC PRESENT, PREDOMINANTLY PMN NO ORGANISMS SEEN    Culture   Final    NO GROWTH < 24 HOURS Performed at [facility] [address] [place], [place] [address]    Report Status PENDING  Incomplete  Aerobic/Anaerobic Culture (surgical/deep wound)     Status: None (Preliminary result)   Collection Time: 11/16/18  4:27 PM   Specimen: Soft Tissue, Other  Result Value Ref Range Status   Specimen Description TISSUE LEFT HAND  Final   Special Requests FINGER DISLOCATION  Final   Gram Stain   Final    MODERATE WBC PRESENT,BOTH PMN AND MONONUCLEAR NO ORGANISMS SEEN    Culture   Final    NO GROWTH < 24 HOURS Performed at [facility], [place] [address], [place], [place] [address]    Report Status PENDING  Incomplete         Radiology Studies: Dg Hand Complete Left  Result Date: 11/16/2018 CLINICAL DATA:  Status post reduction of left middle finger. EXAM: LEFT HAND - COMPLETE 3+ VIEW COMPARISON:  None. FINDINGS: There appears to be persistent volar dislocation of the third metacarpophalangeal joint. No definite fracture is noted at this time. Degenerative changes are seen involving the first carpometacarpal and first metacarpophalangeal joints. IMPRESSION: Persistent dislocation of third metacarpophalangeal joint is noted. Degenerative changes are noted as described above. Electronically Signed   By: Marijo Conception M.D.   On: 11/16/2018 10:14   Dg Hand Complete Left  Result Date: 11/16/2018 CLINICAL DATA:  Pain 4 days ago. EXAM: LEFT HAND - COMPLETE 3+ VIEW COMPARISON:  None. FINDINGS: There is a dislocation at the third MCP joint with overlying soft tissue swelling. There are some soft tissue calcifications adjacent to the third MCP joint. There is some mild irregularity at the base of the third proximal phalanx. On the lateral view, it is difficult to see the metacarpals of all 5 digits. I believe to the metacarpals  overlap on the lateral view. No other evidence of fracture or dislocation. Degenerative changes are seen in the carpal bones. There is subluxation at the first MCP joint without dislocation. No other acute abnormalities are identified. IMPRESSION: 1. There is a dislocation at the third MCP joint. Irregularity at the base of the proximal third phalanx could be degenerative or from subtle fracture given the dislocation. There is overlapping soft tissue swelling. Once the dislocation is reduced, recommend repeat imaging to better evaluate the base of the third proximal phalanx in the distal third metacarpal to evaluate for subtle fracture. 2. Degenerative changes. 3. No other acute abnormalities. Electronically Signed   By: Dorise Bullion III M.D   On: 11/16/2018 09:16        Scheduled Meds: . carvedilol  6.25 mg Oral BID WC  . colchicine  0.6 mg Oral Daily  .  furosemide  20 mg Oral Daily  . pantoprazole  40 mg Oral Daily  . potassium chloride  10 mEq Oral Daily  . traZODone  150 mg Oral QHS   Continuous Infusions: . clindamycin (CLEOCIN) IV 900 mg (11/17/18 1538)  . lactated ringers 50 mL/hr at 11/16/18 2327     LOS: 1 day    Time spent: 69mins    Kathie Dike, MD [facility]   If 7PM-7AM, please contact night-coverage [email or web address]  11/17/2018, 6:58 PM

## 2018-11-18 DIAGNOSIS — Z7901 Long term (current) use of anticoagulants: Secondary | ICD-10-CM

## 2018-11-18 MED ORDER — DOXYCYCLINE HYCLATE 50 MG PO CAPS: 100.0000 mg | ORAL_CAPSULE | Freq: Two times a day (BID) | ORAL | 0 refills | Status: AC

## 2018-11-18 MED ORDER — COLCHICINE 0.6 MG PO TABS: 0.6000 mg | ORAL_TABLET | Freq: Every day | ORAL | 0 refills | Status: DC

## 2018-11-18 NOTE — Progress Notes (Signed)
Patient discharging home. Discharge instructions explained to patient and she verbalized understanding. Patient does not have any family members or friends to pick her up and she requested a cab. [facility] service called and will transport patient. Patient states she has a debit card and will pay for services. No c/o pain or discomfort upon discharge. Took all personal belongings. No further questions or concerns voiced.

## 2018-11-18 NOTE — Plan of Care (Signed)

## 2018-11-18 NOTE — Progress Notes (Signed)
Pharmacy Antibiotic Note  Kimberly [name] is a 85 y.o. female admitted on 11/16/2018 with septic joint.  Pharmacy has been consulted for clindamycin dosing.  Plan: Continue clindamycin 900mg  IV q8h  Monitor clinical progress and cultures/sensitivities.  Pharmacy will sign off.  Height: 5\' 7"  (170.2 cm) Weight: 149 lb (67.6 kg) IBW/kg (Calculated) : 61.6  Temp (24hrs), Avg:97.7 F (36.5 C), Min:97.6 F (36.4 C), Max:97.8 F (36.6 C)  Recent Labs  Lab 11/16/18 0840 11/17/18 0348  WBC 9.0 7.5  CREATININE 1.10* 1.25*    Estimated Creatinine Clearance: 29.7 mL/min (A) (by C-G formula based on SCr of 1.25 mg/dL (H)).    Allergies  Allergen Reactions  . Ciprofloxacin Other (See Comments)    Possibly caused diarrhea November 2018  . Flagyl [Metronidazole] Other (See Comments)    Possibly caused diarrhea November 2018  . Iron Swelling    Ferrous Sulfate - tongue swelling   . Papaya Derivatives Hives  . Iodine Rash and Other (See Comments)    REACTION:If injected,  Rash/irritated skin reaction "welts"  . Penicillins Hives    DID THE REACTION INVOLVE: Swelling of the face/tongue/throat, SOB, or low BP? Unknown Sudden or severe rash/hives, skin peeling, or the inside of the mouth or nose? Yes Did it require medical treatment? Yes When did it last happen?43 or 85 years old If all above answers are "NO", may proceed with cephalosporin use.  . Sulfa Antibiotics Rash    Antimicrobials this admission: 7/14 Clindmycin >>  Microbiology results: Synovial fluid cxs 7/14>>NGTD Tissue culture 7/14>> ngtd  Thank you for allowing pharmacy to be a part of this patient's care.  Cristela Felt, PharmD PGY1 Pharmacy Resident [employer]: [phone]  11/18/2018 7:56 AM

## 2018-11-18 NOTE — Discharge Summary (Signed)
Physician Discharge Summary  LATRISH MOGEL YPP:[id number] DOB: [birth year] DOA: 11/16/2018  PCP: [name], DO  Admit date: 11/16/2018 Discharge date: 11/18/2018  Admitted From: Home Disposition:  home  Recommendations for Outpatient Follow-up:  1. Follow up with PCP in 1-2 weeks 2. Please obtain BMP/CBC in one week 3. Follow-up with Dr. Amedeo Plenty in 1 week  Home Health: Home health RN Equipment/Devices:  Discharge Condition: Stable CODE STATUS: Full code Diet recommendation: Heart healthy  Brief/Interim Summary: 85 year old female who was admitted to the hospital after she fell and dislocated her finger.  She was noted to have injury to her hand and had associated cellulitis of her finger.  She was admitted to the hospital, started on intravenous antibiotics and seen by hand surgery.  She underwent operative repair.  Discharge Diagnoses:  Principal Problem:   Finger dislocation, initial encounter Active Problems:   Permanent atrial fibrillation   Essential hypertension   Depression   CKD (chronic kidney disease) stage 3, GFR 30-59 ml/min (HCC)   Chronic anticoagulation   Chronic diastolic heart failure (HCC)   Complete heart block (HCC)   History of bipolar disorder   Bipolar affective disorder (HCC)   Hand trauma, left, initial encounter  1. Finger dislocation and cellulitis.  Seen by hand surgery and underwent repair reconstruction.  She was treated with intravenous clindamycin and has since been transitioned to oral doxycycline.  She will follow-up with hand surgery in the next week.  Since there was also concern for underlying gouty disease, she will continue on daily colchicine until she can follow-up 2. Hypertension.  Stable on carvedilol and Lasix. 3. Atrial fibrillation, chronic.  Rate controlled on carvedilol.  We started on Eliquis on discharge. 4. Chronic diastolic CHF.  No complaints of shortness of breath.  Continued on home dose of Lasix. 5. Chronic kidney  disease stage III.  Creatinine near baseline. 6. Bipolar disorder.  Follow-up with psychiatry as an outpatient.  Currently stable, no signs of mania.  Discharge Instructions  Discharge Instructions    Diet - low sodium heart healthy   Complete by: As directed    Increase activity slowly   Complete by: As directed      Allergies as of 11/18/2018      Reactions   Ciprofloxacin Other (See Comments)   Possibly caused diarrhea November 2018   Flagyl [metronidazole] Other (See Comments)   Possibly caused diarrhea November 2018   Iron Swelling   Ferrous Sulfate - tongue swelling    Papaya Derivatives Hives   Iodine Rash, Other (See Comments)   REACTION:If injected,  Rash/irritated skin reaction "welts"   Penicillins Hives   DID THE REACTION INVOLVE: Swelling of the face/tongue/throat, SOB, or low BP? Unknown Sudden or severe rash/hives, skin peeling, or the inside of the mouth or nose? Yes Did it require medical treatment? Yes When did it last happen?37 or 85 years old If all above answers are "NO", may proceed with cephalosporin use.   Sulfa Antibiotics Rash      Medication List    TAKE these medications   acetaminophen 325 MG tablet Commonly known as: TYLENOL Take 2 tablets (650 mg total) by mouth every 6 (six) hours as needed for mild pain, fever or headache (or Fever >/= 101).   apixaban 2.5 MG Tabs tablet Commonly known as: Eliquis Take 1 tablet (2.5 mg total) by mouth 2 (two) times daily. Restart on 11/01/18   carvedilol 6.25 MG tablet Commonly known as: COREG Take 1 tablet (  6.25 mg total) by mouth 2 (two) times daily with a meal.   colchicine 0.6 MG tablet Take 1 tablet (0.6 mg total) by mouth daily. Start taking on: November 19, 2018   doxycycline 50 MG capsule Commonly known as: VIBRAMYCIN Take 2 capsules (100 mg total) by mouth 2 (two) times daily for 14 days.   furosemide 20 MG tablet Commonly known as: LASIX Take 1 tablet (20 mg total) by mouth daily.    omeprazole 20 MG tablet Commonly known as: PRILOSEC OTC Take 1 tablet (20 mg total) by mouth daily.   potassium chloride 10 MEQ tablet Commonly known as: K-DUR Take 1 tablet (10 mEq total) by mouth daily.   PreserVision AREDS 2 Caps Take 1 capsule by mouth 2 (two) times daily.   Systane Ultra 0.4-0.3 % Soln Generic drug: Polyethyl Glycol-Propyl Glycol Place 1-2 drops into both eyes 2 (two) times daily as needed (dry eyes).   traZODone 150 MG tablet Commonly known as: DESYREL Take 1 tablet (150 mg total) by mouth at bedtime.      Follow-up Information    Roseanne Kaufman, MD Follow up in 7 day(s).   Specialty: Orthopedic Surgery Why: Please come to the office of Dr. Phillip Heal make in 7 days.  This will be Thursday, July 23 at 9 AM Contact information: [address] [place] [address] [name] [address] [phone]          Allergies  Allergen Reactions  . Ciprofloxacin Other (See Comments)    Possibly caused diarrhea November 2018  . Flagyl [Metronidazole] Other (See Comments)    Possibly caused diarrhea November 2018  . Iron Swelling    Ferrous Sulfate - tongue swelling   . Papaya Derivatives Hives  . Iodine Rash and Other (See Comments)    REACTION:If injected,  Rash/irritated skin reaction "welts"  . Penicillins Hives    DID THE REACTION INVOLVE: Swelling of the face/tongue/throat, SOB, or low BP? Unknown Sudden or severe rash/hives, skin peeling, or the inside of the mouth or nose? Yes Did it require medical treatment? Yes When did it last happen?35 or 85 years old If all above answers are "NO", may proceed with cephalosporin use.  . Sulfa Antibiotics Rash    Consultations:  Hand surgery, Dr. Amedeo Plenty   Procedures/Studies: Ct Abdomen Pelvis Wo Contrast  Result Date: 10/23/2018 CLINICAL DATA:  Recurrent rectal bleeding. EXAM: CT ABDOMEN AND PELVIS WITHOUT CONTRAST TECHNIQUE: Multidetector CT imaging of the abdomen and pelvis was performed following  the standard protocol without IV contrast. COMPARISON:  10/12/2018 FINDINGS: Lower chest: Enlarged heart. Calcific atherosclerotic disease of the coronary arteries and aorta. Partially visualized cardiac pacemaker leads. Hiatal hernia. Hepatobiliary: Hepatic cysts noted. Status post cholecystectomy. No biliary dilatation. Pancreas: Unremarkable. No pancreatic ductal dilatation or surrounding inflammatory changes. Spleen: Normal in size without focal abnormality. Adrenals/Urinary Tract: Adrenal glands are unremarkable. Kidneys are without focal lesion, or hydronephrosis. Punctate right nephrolithiasis, stable. Bladder is unremarkable. Stomach/Bowel: Stomach is within normal limits. No evidence of appendicitis. No evidence of small bowel wall thickening, distention, or inflammatory changes. Extensive left colonic diverticulosis. Mild diverticulitis at the level of the proximal descending colon and mid sigmoid colon. No evidence of rupture or abscess formation. Vascular/Lymphatic: Aortic atherosclerosis. No enlarged abdominal or pelvic lymph nodes. Reproductive: Status post hysterectomy. No adnexal masses. Other: No abdominopelvic ascites. Musculoskeletal: Osteopenia, scoliosis and diffuse spondylosis of the spine. IMPRESSION: 1. Extensive left colonic diverticulosis with mild diverticulitis at the level of the proximal descending colon and mid sigmoid  colon. No evidence of rupture or abscess formation. 2. Punctate right nephrolithiasis. 3. Enlarged heart. 4. Calcific atherosclerotic disease of the coronary arteries and aorta. Electronically Signed   By: Fidela Salisbury M.D.   On: 10/23/2018 20:06   Dg Knee Complete 4 Views Left  Result Date: 10/20/2018 CLINICAL DATA:  Fall with pain and swelling EXAM: LEFT KNEE - COMPLETE 4+ VIEW COMPARISON:  10/09/2018 FINDINGS: No acute displaced fracture or malalignment. Mild mediolateral joint space degenerative change. Joint space calcifications. Vascular calcification.  No significant knee effusion. IMPRESSION: 1. No acute osseous abnormality. 2. Chondrocalcinosis.  Mild degenerative changes. Electronically Signed   By: Donavan Foil M.D.   On: 10/20/2018 20:04   Dg Knee Complete 4 Views Right  Result Date: 10/20/2018 CLINICAL DATA:  Fall EXAM: RIGHT KNEE - COMPLETE 4+ VIEW COMPARISON:  None. FINDINGS: No acute displaced fracture or malalignment. Moderate arthritis of the lateral compartment. Trace knee effusion. Vascular calcifications. IMPRESSION: 1. No definite acute osseous abnormality.  Trace knee effusion. Electronically Signed   By: Donavan Foil M.D.   On: 10/20/2018 20:03   Dg Hand Complete Left  Result Date: 11/16/2018 CLINICAL DATA:  Status post reduction of left middle finger. EXAM: LEFT HAND - COMPLETE 3+ VIEW COMPARISON:  None. FINDINGS: There appears to be persistent volar dislocation of the third metacarpophalangeal joint. No definite fracture is noted at this time. Degenerative changes are seen involving the first carpometacarpal and first metacarpophalangeal joints. IMPRESSION: Persistent dislocation of third metacarpophalangeal joint is noted. Degenerative changes are noted as described above. Electronically Signed   By: Marijo Conception M.D.   On: 11/16/2018 10:14   Dg Hand Complete Left  Result Date: 11/16/2018 CLINICAL DATA:  Pain 4 days ago. EXAM: LEFT HAND - COMPLETE 3+ VIEW COMPARISON:  None. FINDINGS: There is a dislocation at the third MCP joint with overlying soft tissue swelling. There are some soft tissue calcifications adjacent to the third MCP joint. There is some mild irregularity at the base of the third proximal phalanx. On the lateral view, it is difficult to see the metacarpals of all 5 digits. I believe to the metacarpals overlap on the lateral view. No other evidence of fracture or dislocation. Degenerative changes are seen in the carpal bones. There is subluxation at the first MCP joint without dislocation. No other acute  abnormalities are identified. IMPRESSION: 1. There is a dislocation at the third MCP joint. Irregularity at the base of the proximal third phalanx could be degenerative or from subtle fracture given the dislocation. There is overlapping soft tissue swelling. Once the dislocation is reduced, recommend repeat imaging to better evaluate the base of the third proximal phalanx in the distal third metacarpal to evaluate for subtle fracture. 2. Degenerative changes. 3. No other acute abnormalities. Electronically Signed   By: Dorise Bullion III M.D   On: 11/16/2018 09:16       Subjective: No pain in left hand.  No new complaints  Discharge Exam: Vitals:   11/17/18 1410 11/17/18 1929 11/18/18 0522 11/18/18 0843  BP: 118/64 118/65 (!) 110/53 (!) 134/91  Pulse: 70 68 70 74  Resp: 16 16 14 18   Temp: 97.8 F (36.6 C) 97.8 F (36.6 C) 97.7 F (36.5 C) 97.7 F (36.5 C)  TempSrc: Oral Oral Oral Oral  SpO2: 98% 91% 98% 100%  Weight:      Height:        General: Pt is alert, awake, not in acute distress Cardiovascular: RRR, S1/S2 +,  no rubs, no gallops Respiratory: CTA bilaterally, no wheezing, no rhonchi Abdominal: Soft, NT, ND, bowel sounds + Extremities: no edema, no cyanosis, left arm is wrapped in dressings    The results of significant diagnostics from this hospitalization (including imaging, microbiology, ancillary and laboratory) are listed below for reference.     Microbiology: Recent Results (from the past 240 hour(s))  SARS Coronavirus 2 (CEPHEID - Performed in [place] hospital lab), Hosp Order     Status: None   Collection Time: 11/16/18 10:42 AM   Specimen: Nasopharyngeal Swab  Result Value Ref Range Status   SARS Coronavirus 2 NEGATIVE NEGATIVE Final    Comment: (NOTE) If result is NEGATIVE SARS-CoV-2 target nucleic acids are NOT DETECTED. The SARS-CoV-2 RNA is generally detectable in upper and lower  respiratory specimens during the acute phase of infection. The  lowest  concentration of SARS-CoV-2 viral copies this assay can detect is 250  copies / mL. A negative result does not preclude SARS-CoV-2 infection  and should not be used as the sole basis for treatment or other  patient management decisions.  A negative result may occur with  improper specimen collection / handling, submission of specimen other  than nasopharyngeal swab, presence of viral mutation(s) within the  areas targeted by this assay, and inadequate number of viral copies  (<250 copies / mL). A negative result must be combined with clinical  observations, patient history, and epidemiological information. If result is POSITIVE SARS-CoV-2 target nucleic acids are DETECTED. The SARS-CoV-2 RNA is generally detectable in upper and lower  respiratory specimens dur ing the acute phase of infection.  Positive  results are indicative of active infection with SARS-CoV-2.  Clinical  correlation with patient history and other diagnostic information is  necessary to determine patient infection status.  Positive results do  not rule out bacterial infection or co-infection with other viruses. If result is PRESUMPTIVE POSTIVE SARS-CoV-2 nucleic acids MAY BE PRESENT.   A presumptive positive result was obtained on the submitted specimen  and confirmed on repeat testing.  While 2019 novel coronavirus  (SARS-CoV-2) nucleic acids may be present in the submitted sample  additional confirmatory testing may be necessary for epidemiological  and / or clinical management purposes  to differentiate between  SARS-CoV-2 and other Sarbecovirus currently known to infect humans.  If clinically indicated additional testing with an alternate test  methodology [phone]) is advised. The SARS-CoV-2 RNA is generally  detectable in upper and lower respiratory sp ecimens during the acute  phase of infection. The expected result is Negative. Fact Sheet for Patients:   StrictlyIdeas.no Fact Sheet for Healthcare Providers: BankingDealers.co.za This test is not yet approved or cleared by the [place] FDA and has been authorized for detection and/or diagnosis of SARS-CoV-2 by FDA under an Emergency Use Authorization (EUA).  This EUA will remain in effect (meaning this test can be used) for the duration of the COVID-19 declaration under Section 564(b)(1) of the Act, 21 U.S.C. section 360bbb-3(b)(1), unless the authorization is terminated or revoked sooner. Performed at [facility], [address] [place], [place] [address]   Aerobic/Anaerobic Culture (surgical/deep wound)     Status: None (Preliminary result)   Collection Time: 11/16/18  4:00 PM   Specimen: Soft Tissue, Other  Result Value Ref Range Status   Specimen Description SYNOVIAL LEFT HAND  Final   Special Requests FINGER DISLOCATION  Final   Gram Stain   Final    ABUNDANT WBC PRESENT, PREDOMINANTLY PMN NO ORGANISMS  SEEN    Culture   Final    NO GROWTH 2 DAYS NO ANAEROBES ISOLATED; CULTURE IN PROGRESS FOR 5 DAYS Performed at [facility], [place] [address] [place], [place] [address]    Report Status PENDING  Incomplete  Aerobic/Anaerobic Culture (surgical/deep wound)     Status: None (Preliminary result)   Collection Time: 11/16/18  4:27 PM   Specimen: Soft Tissue, Other  Result Value Ref Range Status   Specimen Description TISSUE LEFT HAND  Final   Special Requests FINGER DISLOCATION  Final   Gram Stain   Final    MODERATE WBC PRESENT,BOTH PMN AND MONONUCLEAR NO ORGANISMS SEEN    Culture   Final    NO GROWTH 2 DAYS NO ANAEROBES ISOLATED; CULTURE IN PROGRESS FOR 5 DAYS Performed at [facility], [place] [address], [place], [place] [address]    Report Status PENDING  Incomplete     Labs: BNP (last 3 results) Recent Labs    04/04/18 1333 04/25/18 2005  BNP 178.0* 694.8*   Basic Metabolic Panel: Recent Labs  Lab  11/16/18 0840 11/17/18 0348  NA 139 135  K 3.4* 4.2  CL 104 102  CO2 25 23  GLUCOSE 110* 131*  BUN 20 22  CREATININE 1.10* 1.25*  CALCIUM 8.5* 8.6*   Liver Function Tests: No results for input(s): AST, ALT, ALKPHOS, BILITOT, PROT, ALBUMIN in the last 168 hours. No results for input(s): LIPASE, AMYLASE in the last 168 hours. No results for input(s): AMMONIA in the last 168 hours. CBC: Recent Labs  Lab 11/16/18 0840 11/17/18 0348  WBC 9.0 7.5  NEUTROABS 6.9  --   HGB 9.6* 9.4*  HCT 29.7* 29.3*  MCV 96.4 97.3  PLT 174 157   Cardiac Enzymes: No results for input(s): CKTOTAL, CKMB, CKMBINDEX, TROPONINI in the last 168 hours. BNP: Invalid input(s): POCBNP CBG: No results for input(s): GLUCAP in the last 168 hours. D-Dimer No results for input(s): DDIMER in the last 72 hours. Hgb A1c No results for input(s): HGBA1C in the last 72 hours. Lipid Profile No results for input(s): CHOL, HDL, LDLCALC, TRIG, CHOLHDL, LDLDIRECT in the last 72 hours. Thyroid function studies No results for input(s): TSH, T4TOTAL, T3FREE, THYROIDAB in the last 72 hours.  Invalid input(s): FREET3 Anemia work up No results for input(s): VITAMINB12, FOLATE, FERRITIN, TIBC, IRON, RETICCTPCT in the last 72 hours. Urinalysis    Component Value Date/Time   COLORURINE AMBER (A) 10/23/2018 0910   APPEARANCEUR CLOUDY (A) 10/23/2018 0910   LABSPEC 1.013 10/23/2018 0910   PHURINE 6.0 10/23/2018 0910   GLUCOSEU NEGATIVE 10/23/2018 0910   HGBUR MODERATE (A) 10/23/2018 0910   BILIRUBINUR NEGATIVE 10/23/2018 0910   BILIRUBINUR neg 04/25/2014 0855   KETONESUR 5 (A) 10/23/2018 0910   PROTEINUR NEGATIVE 10/23/2018 0910   UROBILINOGEN 0.2 05/26/2014 1415   NITRITE NEGATIVE 10/23/2018 0910   LEUKOCYTESUR MODERATE (A) 10/23/2018 0910   Sepsis Labs Invalid input(s): PROCALCITONIN,  WBC,  LACTICIDVEN Microbiology Recent Results (from the past 240 hour(s))  SARS Coronavirus 2 (CEPHEID - Performed in [place] hospital lab), Hosp Order     Status: None   Collection Time: 11/16/18 10:42 AM   Specimen: Nasopharyngeal Swab  Result Value Ref Range Status   SARS Coronavirus 2 NEGATIVE NEGATIVE Final    Comment: (NOTE) If result is NEGATIVE SARS-CoV-2 target nucleic acids are NOT DETECTED. The SARS-CoV-2 RNA is generally detectable in upper and lower  respiratory specimens during the acute  phase of infection. The lowest  concentration of SARS-CoV-2 viral copies this assay can detect is 250  copies / mL. A negative result does not preclude SARS-CoV-2 infection  and should not be used as the sole basis for treatment or other  patient management decisions.  A negative result may occur with  improper specimen collection / handling, submission of specimen other  than nasopharyngeal swab, presence of viral mutation(s) within the  areas targeted by this assay, and inadequate number of viral copies  (<250 copies / mL). A negative result must be combined with clinical  observations, patient history, and epidemiological information. If result is POSITIVE SARS-CoV-2 target nucleic acids are DETECTED. The SARS-CoV-2 RNA is generally detectable in upper and lower  respiratory specimens dur ing the acute phase of infection.  Positive  results are indicative of active infection with SARS-CoV-2.  Clinical  correlation with patient history and other diagnostic information is  necessary to determine patient infection status.  Positive results do  not rule out bacterial infection or co-infection with other viruses. If result is PRESUMPTIVE POSTIVE SARS-CoV-2 nucleic acids MAY BE PRESENT.   A presumptive positive result was obtained on the submitted specimen  and confirmed on repeat testing.  While 2019 novel coronavirus  (SARS-CoV-2) nucleic acids may be present in the submitted sample  additional confirmatory testing may be necessary for epidemiological  and / or clinical management purposes  to  differentiate between  SARS-CoV-2 and other Sarbecovirus currently known to infect humans.  If clinically indicated additional testing with an alternate test  methodology [phone]) is advised. The SARS-CoV-2 RNA is generally  detectable in upper and lower respiratory sp ecimens during the acute  phase of infection. The expected result is Negative. Fact Sheet for Patients:  StrictlyIdeas.no Fact Sheet for Healthcare Providers: BankingDealers.co.za This test is not yet approved or cleared by the [place] FDA and has been authorized for detection and/or diagnosis of SARS-CoV-2 by FDA under an Emergency Use Authorization (EUA).  This EUA will remain in effect (meaning this test can be used) for the duration of the COVID-19 declaration under Section 564(b)(1) of the Act, 21 U.S.C. section 360bbb-3(b)(1), unless the authorization is terminated or revoked sooner. Performed at [facility], [address], [place], [place] [address]   Aerobic/Anaerobic Culture (surgical/deep wound)     Status: None (Preliminary result)   Collection Time: 11/16/18  4:00 PM   Specimen: Soft Tissue, Other  Result Value Ref Range Status   Specimen Description SYNOVIAL LEFT HAND  Final   Special Requests FINGER DISLOCATION  Final   Gram Stain   Final    ABUNDANT WBC PRESENT, PREDOMINANTLY PMN NO ORGANISMS SEEN    Culture   Final    NO GROWTH 2 DAYS NO ANAEROBES ISOLATED; CULTURE IN PROGRESS FOR 5 DAYS Performed at [facility] [address] [place], [place] [address]    Report Status PENDING  Incomplete  Aerobic/Anaerobic Culture (surgical/deep wound)     Status: None (Preliminary result)   Collection Time: 11/16/18  4:27 PM   Specimen: Soft Tissue, Other  Result Value Ref Range Status   Specimen Description TISSUE LEFT HAND  Final   Special Requests FINGER DISLOCATION  Final   Gram Stain   Final    MODERATE WBC PRESENT,BOTH PMN AND  MONONUCLEAR NO ORGANISMS SEEN    Culture   Final    NO GROWTH 2 DAYS NO ANAEROBES ISOLATED; CULTURE IN PROGRESS FOR 5 DAYS Performed at [facility], [place] [address], [place], [place] [address]    Report Status PENDING  Incomplete     Time coordinating discharge: 72mins  SIGNED:   Kathie Dike, MD  [facility] 11/18/2018, 8:54 PM   If 7PM-7AM, please contact night-coverage [email or web address]

## 2018-11-18 NOTE — Progress Notes (Signed)
Patient ID: Kimberly [name], female   DOB: [birth year], 85 y.o.   MRN: [id number] Patient seen at bedside.  Cultures are negative today.  At present juncture I do feel the patient looks fairly stable.  I do not see where her gout analysis has been done per the chart.  I am not sure if there was a mistake in this in terms of the lab etc.  Crystals were sent from the operating room for analysis thus this would be helpful.  At present juncture she is markedly improved albeit still somewhat sore.  Given the improvement I would recommend that she be able to transition to home per her hand issues and see me in the office next Thursday with a therapy appointment immediately following.  I would recommend an appointment Thursday at 9 AM if she is allowed to be discharged home.  I will make this arrangement in the follow-up portion of the discharge summary.  I do feel comfortable with this approach.  I would recommend antibiotics outpatient.  Typically doxycycline twice a day is a good option and does not impact her allergy profile.  I discussed all issues with the patient at bedside.  She is actually anxious to return home  White count remains normal.  I discussed all issues at bedside today.  Jearline Hirschhorn MD cell phone [phone]

## 2018-11-21 DIAGNOSIS — I4821 Permanent atrial fibrillation: Secondary | ICD-10-CM | POA: Diagnosis not present

## 2018-11-21 DIAGNOSIS — Z7901 Long term (current) use of anticoagulants: Secondary | ICD-10-CM | POA: Diagnosis not present

## 2018-11-21 DIAGNOSIS — N183 Chronic kidney disease, stage 3 (moderate): Secondary | ICD-10-CM | POA: Diagnosis not present

## 2018-11-21 DIAGNOSIS — L03116 Cellulitis of left lower limb: Secondary | ICD-10-CM | POA: Diagnosis not present

## 2018-11-21 DIAGNOSIS — I5042 Chronic combined systolic (congestive) and diastolic (congestive) heart failure: Secondary | ICD-10-CM | POA: Diagnosis not present

## 2018-11-21 DIAGNOSIS — M1990 Unspecified osteoarthritis, unspecified site: Secondary | ICD-10-CM | POA: Diagnosis not present

## 2018-11-21 DIAGNOSIS — Z9181 History of falling: Secondary | ICD-10-CM | POA: Diagnosis not present

## 2018-11-21 DIAGNOSIS — K208 Other esophagitis: Secondary | ICD-10-CM | POA: Diagnosis not present

## 2018-11-21 DIAGNOSIS — I130 Hypertensive heart and chronic kidney disease with heart failure and stage 1 through stage 4 chronic kidney disease, or unspecified chronic kidney disease: Secondary | ICD-10-CM | POA: Diagnosis not present

## 2018-11-21 DIAGNOSIS — K5791 Diverticulosis of intestine, part unspecified, without perforation or abscess with bleeding: Secondary | ICD-10-CM | POA: Diagnosis not present

## 2018-11-21 DIAGNOSIS — Z79891 Long term (current) use of opiate analgesic: Secondary | ICD-10-CM | POA: Diagnosis not present

## 2018-11-21 DIAGNOSIS — H409 Unspecified glaucoma: Secondary | ICD-10-CM | POA: Diagnosis not present

## 2018-11-21 DIAGNOSIS — K259 Gastric ulcer, unspecified as acute or chronic, without hemorrhage or perforation: Secondary | ICD-10-CM | POA: Diagnosis not present

## 2018-11-21 LAB — AEROBIC/ANAEROBIC CULTURE W GRAM STAIN (SURGICAL/DEEP WOUND)
Culture: NO GROWTH
Culture: NO GROWTH

## 2018-11-22 ENCOUNTER — Ambulatory Visit: Payer: Medicare Other | Admitting: Physician Assistant

## 2018-11-22 ENCOUNTER — Other Ambulatory Visit: Payer: Self-pay | Admitting: *Deleted

## 2018-11-22 NOTE — Patient Outreach (Signed)
Member noted on Patient Ping high ED [employer].  Chart reviewed. Noted [name] is a patient of [facility]. This practice has a [place] Team. [name] is active with [facility] ECM team already.  Notification sent to [facility] Management team to make aware of member's high utlization and most recent hospital discharge.    Kimberly Rolling, MSN-Ed, Kimberly [name],Kimberly [name] [place] Acute Care Coordinator [phone] [facility]) [phone]  (Toll free office)

## 2018-11-24 DIAGNOSIS — I5042 Chronic combined systolic (congestive) and diastolic (congestive) heart failure: Secondary | ICD-10-CM | POA: Diagnosis not present

## 2018-11-24 DIAGNOSIS — L03116 Cellulitis of left lower limb: Secondary | ICD-10-CM | POA: Diagnosis not present

## 2018-11-24 DIAGNOSIS — I130 Hypertensive heart and chronic kidney disease with heart failure and stage 1 through stage 4 chronic kidney disease, or unspecified chronic kidney disease: Secondary | ICD-10-CM | POA: Diagnosis not present

## 2018-11-24 DIAGNOSIS — M1990 Unspecified osteoarthritis, unspecified site: Secondary | ICD-10-CM | POA: Diagnosis not present

## 2018-11-24 DIAGNOSIS — Z7901 Long term (current) use of anticoagulants: Secondary | ICD-10-CM | POA: Diagnosis not present

## 2018-11-24 DIAGNOSIS — K5791 Diverticulosis of intestine, part unspecified, without perforation or abscess with bleeding: Secondary | ICD-10-CM | POA: Diagnosis not present

## 2018-11-24 DIAGNOSIS — I4821 Permanent atrial fibrillation: Secondary | ICD-10-CM | POA: Diagnosis not present

## 2018-11-24 DIAGNOSIS — H409 Unspecified glaucoma: Secondary | ICD-10-CM | POA: Diagnosis not present

## 2018-11-24 DIAGNOSIS — K259 Gastric ulcer, unspecified as acute or chronic, without hemorrhage or perforation: Secondary | ICD-10-CM | POA: Diagnosis not present

## 2018-11-24 DIAGNOSIS — Z9181 History of falling: Secondary | ICD-10-CM | POA: Diagnosis not present

## 2018-11-24 DIAGNOSIS — N183 Chronic kidney disease, stage 3 (moderate): Secondary | ICD-10-CM | POA: Diagnosis not present

## 2018-11-24 DIAGNOSIS — Z79891 Long term (current) use of opiate analgesic: Secondary | ICD-10-CM | POA: Diagnosis not present

## 2018-11-24 DIAGNOSIS — K208 Other esophagitis: Secondary | ICD-10-CM | POA: Diagnosis not present

## 2018-11-25 ENCOUNTER — Ambulatory Visit: Payer: Self-pay | Admitting: Licensed Clinical Social Worker

## 2018-11-25 DIAGNOSIS — I4821 Permanent atrial fibrillation: Secondary | ICD-10-CM

## 2018-11-25 DIAGNOSIS — M79642 Pain in left hand: Secondary | ICD-10-CM | POA: Diagnosis not present

## 2018-11-25 DIAGNOSIS — N183 Chronic kidney disease, stage 3 unspecified: Secondary | ICD-10-CM

## 2018-11-25 DIAGNOSIS — K219 Gastro-esophageal reflux disease without esophagitis: Secondary | ICD-10-CM

## 2018-11-25 DIAGNOSIS — I10 Essential (primary) hypertension: Secondary | ICD-10-CM

## 2018-11-25 DIAGNOSIS — S61213A Laceration without foreign body of left middle finger without damage to nail, initial encounter: Secondary | ICD-10-CM | POA: Diagnosis not present

## 2018-11-25 DIAGNOSIS — F3178 Bipolar disorder, in full remission, most recent episode mixed: Secondary | ICD-10-CM

## 2018-11-25 DIAGNOSIS — F331 Major depressive disorder, recurrent, moderate: Secondary | ICD-10-CM

## 2018-11-25 NOTE — Chronic Care Management (AMB) (Signed)
  Care Management Note   Kimberly [name] is a 85 y.o. year old female who is a primary care patient of Kimberly Norlander, DO. The CM team was consulted for assistance with chronic disease management and care coordination.   I reached out to Kimberly [name] by phone today.   Review of patient status, including review of consultants reports, relevant laboratory and other test results, and collaboration with appropriate care team members and the patient's provider was performed as part of comprehensive patient evaluation and provision of chronic care management services.   Social Determinants of Health: risk for Social Isolation; risk for Depression risk for tobacco use    Patient Outreach Telephone from 11/04/2018 in [place]  PHQ-9 Total Score  7     Goals    . Client verbalizes she wants to talk with someone about anxiety and stress about managing her health conditions (patient stated) (pt-stated)     Current Barriers:  . Transportation . Social Isolation . Mental Health Challenges  Clinical Social Work Clinical Goal(s):  Kimberly [name] Kitchen [name] will talk with client in next 30 days to discuss anxiety and stress issues about managing her health conditions.   Interventions: .  Kimberly [name] Kitchen Talked with client about current social work needs of client . Talked with client about social support network of client . Talked  with client about relaxation techniques to help client manage stress/anxiety symptoms. . Talked with client about her recent hand surgery  . Talked with client about home health services she has received  Patient Self Care Activities:  . Takes medications as prescribed . Talks to PCP as needed about medical needs of client  . Attends medical appointments as scheduled  Plan:  Client to call [name] to discuss social work needs of client Client to talk with RNCM about nursing needs of client [name] to call client in next 3 weeks to assess psychosocial needs of client  Initial goal  documentation       Client said she had recent hand surgery on her left hand and said that her stiches had been removed from surgery site. She said she uses walker to help with ambulation. [name] encouraged client to talk with Kimberly [name] about nursing needs of client.   Follow Up Plan: [name] to call client in next 3 weeks to assess psychosocial needs of client  Kimberly [name].Kimberly [name] MSW, [name] Licensed Clinical Social Worker [facility] [phone]

## 2018-11-25 NOTE — Patient Instructions (Signed)
Licensed Clinical Social Worker Visit Information  Materials Provided: No  Goals    . Client verbalizes she wants to talk with someone about anxiety and stress about managing her health conditions (patient stated) (pt-stated)     Current Barriers:  . Transportation . Social Isolation . Mental Health Challenges  Clinical Social Work Clinical Goal(s):  [name] [name] will talk with client in next 30 days to discuss anxiety and stress issues about managing her health conditions.   Interventions:  . Talked with client about current social work needs of client . Talked with client about social support network of client . Talked  with client about relaxation techniques to help client manage stress/anxiety symptoms. . Talked with client about her recent hand surgery   Patient Self Care Activities:  . Takes medications as prescribed . Talks to PCP as needed about medical needs of client  . Attends medical appointments as scheduled  Plan: Client to call [name] to discuss social work needs of client Client to talk with RNCM about nursing needs of client [name] to call client in next 3 weeks to assess psychosocial needs of client  Initial goal documentation       Follow Up Plan: [name] to call client in next 3 weeks to talk with client about psychosocial  needs of client  The patient verbalized understanding of instructions provided today and declined a print copy of patient instruction materials.   [name].Mikle Sternberg MSW, [name] Licensed Clinical Social Worker [facility] [phone]

## 2018-11-30 DIAGNOSIS — K5791 Diverticulosis of intestine, part unspecified, without perforation or abscess with bleeding: Secondary | ICD-10-CM | POA: Diagnosis not present

## 2018-11-30 DIAGNOSIS — M1990 Unspecified osteoarthritis, unspecified site: Secondary | ICD-10-CM | POA: Diagnosis not present

## 2018-11-30 DIAGNOSIS — Z9181 History of falling: Secondary | ICD-10-CM | POA: Diagnosis not present

## 2018-11-30 DIAGNOSIS — Z79891 Long term (current) use of opiate analgesic: Secondary | ICD-10-CM | POA: Diagnosis not present

## 2018-11-30 DIAGNOSIS — N183 Chronic kidney disease, stage 3 (moderate): Secondary | ICD-10-CM | POA: Diagnosis not present

## 2018-11-30 DIAGNOSIS — K259 Gastric ulcer, unspecified as acute or chronic, without hemorrhage or perforation: Secondary | ICD-10-CM | POA: Diagnosis not present

## 2018-11-30 DIAGNOSIS — H409 Unspecified glaucoma: Secondary | ICD-10-CM | POA: Diagnosis not present

## 2018-11-30 DIAGNOSIS — L03116 Cellulitis of left lower limb: Secondary | ICD-10-CM | POA: Diagnosis not present

## 2018-11-30 DIAGNOSIS — I5042 Chronic combined systolic (congestive) and diastolic (congestive) heart failure: Secondary | ICD-10-CM | POA: Diagnosis not present

## 2018-11-30 DIAGNOSIS — I4821 Permanent atrial fibrillation: Secondary | ICD-10-CM | POA: Diagnosis not present

## 2018-11-30 DIAGNOSIS — Z7901 Long term (current) use of anticoagulants: Secondary | ICD-10-CM | POA: Diagnosis not present

## 2018-11-30 DIAGNOSIS — K208 Other esophagitis: Secondary | ICD-10-CM | POA: Diagnosis not present

## 2018-11-30 DIAGNOSIS — I130 Hypertensive heart and chronic kidney disease with heart failure and stage 1 through stage 4 chronic kidney disease, or unspecified chronic kidney disease: Secondary | ICD-10-CM | POA: Diagnosis not present

## 2018-12-02 DIAGNOSIS — Z79891 Long term (current) use of opiate analgesic: Secondary | ICD-10-CM | POA: Diagnosis not present

## 2018-12-02 DIAGNOSIS — L03116 Cellulitis of left lower limb: Secondary | ICD-10-CM | POA: Diagnosis not present

## 2018-12-02 DIAGNOSIS — N183 Chronic kidney disease, stage 3 (moderate): Secondary | ICD-10-CM | POA: Diagnosis not present

## 2018-12-02 DIAGNOSIS — K208 Other esophagitis: Secondary | ICD-10-CM | POA: Diagnosis not present

## 2018-12-02 DIAGNOSIS — I4821 Permanent atrial fibrillation: Secondary | ICD-10-CM | POA: Diagnosis not present

## 2018-12-02 DIAGNOSIS — I5042 Chronic combined systolic (congestive) and diastolic (congestive) heart failure: Secondary | ICD-10-CM | POA: Diagnosis not present

## 2018-12-02 DIAGNOSIS — Z9181 History of falling: Secondary | ICD-10-CM | POA: Diagnosis not present

## 2018-12-02 DIAGNOSIS — K5791 Diverticulosis of intestine, part unspecified, without perforation or abscess with bleeding: Secondary | ICD-10-CM | POA: Diagnosis not present

## 2018-12-02 DIAGNOSIS — K259 Gastric ulcer, unspecified as acute or chronic, without hemorrhage or perforation: Secondary | ICD-10-CM | POA: Diagnosis not present

## 2018-12-02 DIAGNOSIS — H409 Unspecified glaucoma: Secondary | ICD-10-CM | POA: Diagnosis not present

## 2018-12-02 DIAGNOSIS — Z7901 Long term (current) use of anticoagulants: Secondary | ICD-10-CM | POA: Diagnosis not present

## 2018-12-02 DIAGNOSIS — I130 Hypertensive heart and chronic kidney disease with heart failure and stage 1 through stage 4 chronic kidney disease, or unspecified chronic kidney disease: Secondary | ICD-10-CM | POA: Diagnosis not present

## 2018-12-02 DIAGNOSIS — M1990 Unspecified osteoarthritis, unspecified site: Secondary | ICD-10-CM | POA: Diagnosis not present

## 2018-12-06 DIAGNOSIS — K208 Other esophagitis: Secondary | ICD-10-CM | POA: Diagnosis not present

## 2018-12-06 DIAGNOSIS — K259 Gastric ulcer, unspecified as acute or chronic, without hemorrhage or perforation: Secondary | ICD-10-CM | POA: Diagnosis not present

## 2018-12-06 DIAGNOSIS — N183 Chronic kidney disease, stage 3 (moderate): Secondary | ICD-10-CM | POA: Diagnosis not present

## 2018-12-06 DIAGNOSIS — I130 Hypertensive heart and chronic kidney disease with heart failure and stage 1 through stage 4 chronic kidney disease, or unspecified chronic kidney disease: Secondary | ICD-10-CM | POA: Diagnosis not present

## 2018-12-06 DIAGNOSIS — M1990 Unspecified osteoarthritis, unspecified site: Secondary | ICD-10-CM | POA: Diagnosis not present

## 2018-12-06 DIAGNOSIS — Z9181 History of falling: Secondary | ICD-10-CM | POA: Diagnosis not present

## 2018-12-06 DIAGNOSIS — L03116 Cellulitis of left lower limb: Secondary | ICD-10-CM | POA: Diagnosis not present

## 2018-12-06 DIAGNOSIS — I5042 Chronic combined systolic (congestive) and diastolic (congestive) heart failure: Secondary | ICD-10-CM | POA: Diagnosis not present

## 2018-12-06 DIAGNOSIS — I4821 Permanent atrial fibrillation: Secondary | ICD-10-CM | POA: Diagnosis not present

## 2018-12-06 DIAGNOSIS — H409 Unspecified glaucoma: Secondary | ICD-10-CM | POA: Diagnosis not present

## 2018-12-06 DIAGNOSIS — Z7901 Long term (current) use of anticoagulants: Secondary | ICD-10-CM | POA: Diagnosis not present

## 2018-12-06 DIAGNOSIS — K5791 Diverticulosis of intestine, part unspecified, without perforation or abscess with bleeding: Secondary | ICD-10-CM | POA: Diagnosis not present

## 2018-12-06 DIAGNOSIS — Z79891 Long term (current) use of opiate analgesic: Secondary | ICD-10-CM | POA: Diagnosis not present

## 2018-12-08 ENCOUNTER — Telehealth: Payer: Self-pay | Admitting: Family Medicine

## 2018-12-08 NOTE — Telephone Encounter (Signed)
appt scheduled Pt notified 

## 2018-12-08 NOTE — Telephone Encounter (Signed)
appt rescheduled.

## 2018-12-09 DIAGNOSIS — K208 Other esophagitis: Secondary | ICD-10-CM | POA: Diagnosis not present

## 2018-12-09 DIAGNOSIS — H409 Unspecified glaucoma: Secondary | ICD-10-CM | POA: Diagnosis not present

## 2018-12-09 DIAGNOSIS — I130 Hypertensive heart and chronic kidney disease with heart failure and stage 1 through stage 4 chronic kidney disease, or unspecified chronic kidney disease: Secondary | ICD-10-CM | POA: Diagnosis not present

## 2018-12-09 DIAGNOSIS — L03116 Cellulitis of left lower limb: Secondary | ICD-10-CM | POA: Diagnosis not present

## 2018-12-09 DIAGNOSIS — K5791 Diverticulosis of intestine, part unspecified, without perforation or abscess with bleeding: Secondary | ICD-10-CM | POA: Diagnosis not present

## 2018-12-09 DIAGNOSIS — I4821 Permanent atrial fibrillation: Secondary | ICD-10-CM | POA: Diagnosis not present

## 2018-12-09 DIAGNOSIS — K259 Gastric ulcer, unspecified as acute or chronic, without hemorrhage or perforation: Secondary | ICD-10-CM | POA: Diagnosis not present

## 2018-12-09 DIAGNOSIS — Z79891 Long term (current) use of opiate analgesic: Secondary | ICD-10-CM | POA: Diagnosis not present

## 2018-12-09 DIAGNOSIS — M1990 Unspecified osteoarthritis, unspecified site: Secondary | ICD-10-CM | POA: Diagnosis not present

## 2018-12-09 DIAGNOSIS — Z9181 History of falling: Secondary | ICD-10-CM | POA: Diagnosis not present

## 2018-12-09 DIAGNOSIS — N183 Chronic kidney disease, stage 3 (moderate): Secondary | ICD-10-CM | POA: Diagnosis not present

## 2018-12-09 DIAGNOSIS — I5042 Chronic combined systolic (congestive) and diastolic (congestive) heart failure: Secondary | ICD-10-CM | POA: Diagnosis not present

## 2018-12-09 DIAGNOSIS — Z7901 Long term (current) use of anticoagulants: Secondary | ICD-10-CM | POA: Diagnosis not present

## 2018-12-10 ENCOUNTER — Other Ambulatory Visit: Payer: Self-pay

## 2018-12-13 ENCOUNTER — Other Ambulatory Visit: Payer: Self-pay

## 2018-12-13 ENCOUNTER — Ambulatory Visit: Payer: Medicare Other | Admitting: Family Medicine

## 2018-12-13 ENCOUNTER — Ambulatory Visit (INDEPENDENT_AMBULATORY_CARE_PROVIDER_SITE_OTHER): Payer: Medicare Other | Admitting: Physician Assistant

## 2018-12-13 ENCOUNTER — Encounter: Payer: Self-pay | Admitting: Physician Assistant

## 2018-12-13 VITALS — BP 149/74 | HR 70 | Temp 96.9°F | Ht 67.0 in | Wt 143.6 lb

## 2018-12-13 DIAGNOSIS — L03116 Cellulitis of left lower limb: Secondary | ICD-10-CM

## 2018-12-13 MED ORDER — DOXYCYCLINE HYCLATE 100 MG PO TABS: 100.0000 mg | ORAL_TABLET | Freq: Two times a day (BID) | ORAL | 0 refills | Status: DC

## 2018-12-13 NOTE — Progress Notes (Signed)
BP (!) 149/74   Pulse 70   Temp (!) 96.9 F (36.1 C) (Temporal)   Ht 5\' 7"  (1.702 m)   Wt 143 lb 9.6 oz (65.1 kg)   BMI 22.49 kg/m    Subjective:    Patient ID: Kimberly [name], female    DOB: January 14, 1927, 85 y.o.   MRN: [id number]  HPI: Kimberly [name] is a 85 y.o. female presenting on 12/13/2018 for Wound Check (left leg )  On her chart the patient was scheduled for wound care check after a telephone call last week.  She states she has continued to have small area that is not healing.  She stated that this actually started as far back as January.  And going back and looking in her notes it had.  There is not any significant redness or fever at this time.  However she is having scaling and significant itching all around the skin.  Started to ask about a lot of her chronic medical conditions of trying to get medications for this.  I instructed her that she needs to make an appointment with her PCP to come back and discuss all of these things.  Past Medical History:  Diagnosis Date  . Acute blood loss anemia 10/11/2012  . Acute diverticulitis 08/24/2013  . Acute on chronic combined systolic and diastolic CHF, NYHA class 4 ([place]) 11/15/2013  . Antral ulcer 10/11/2012  . Arrhythmia    atrial fibb  . Atrial fibrillation ([place])   . Cardiomyopathy, nonischemic ([place])   . Chronic anticoagulation 10/12/2012  . CKD (chronic kidney disease) stage 3, GFR 30-59 ml/min ([facility]) 10/12/2012  . Depression   . Erosive esophagitis 10/11/2012  . Fibromyalgia   . Glaucoma   . H/O echocardiogram 2007   EF 40-45%,         . Hypertension   . Osteoarthritis   . Pacemaker    Last saw cards 07/2013  . Scoliosis    Relevant past medical, surgical, family and social history reviewed and updated as indicated. Interim medical history since our last visit reviewed. Allergies and medications reviewed and updated. DATA REVIEWED: CHART IN EPIC  Family History reviewed for pertinent findings.  Review of Systems   Constitutional: Negative.   HENT: Negative.   Eyes: Negative.   Respiratory: Negative.   Gastrointestinal: Negative.   Genitourinary: Negative.   Skin: Positive for color change and wound.    Allergies as of 12/13/2018      Reactions   Ciprofloxacin Other (See Comments)   Possibly caused diarrhea November 2018   Flagyl [metronidazole] Other (See Comments)   Possibly caused diarrhea November 2018   Iron Swelling   Ferrous Sulfate - tongue swelling    Papaya Derivatives Hives   Iodine Rash, Other (See Comments)   REACTION:If injected,  Rash/irritated skin reaction "welts"   Penicillins Hives   DID THE REACTION INVOLVE: Swelling of the face/tongue/throat, SOB, or low BP? Unknown Sudden or severe rash/hives, skin peeling, or the inside of the mouth or nose? Yes Did it require medical treatment? Yes When did it last happen?73 or 85 years old If all above answers are "NO", may proceed with cephalosporin use.   Sulfa Antibiotics Rash      Medication List       Accurate as of December 13, 2018 11:59 PM. If you have any questions, ask your nurse or doctor.        STOP taking these medications   potassium chloride 10 MEQ tablet  Commonly known as: K-DUR Stopped by: Terald Sleeper, PA-C     TAKE these medications   acetaminophen 325 MG tablet Commonly known as: TYLENOL Take 2 tablets (650 mg total) by mouth every 6 (six) hours as needed for mild pain, fever or headache (or Fever >/= 101).   apixaban 2.5 MG Tabs tablet Commonly known as: Eliquis Take 1 tablet (2.5 mg total) by mouth 2 (two) times daily. Restart on 11/01/18   carvedilol 6.25 MG tablet Commonly known as: COREG Take 1 tablet (6.25 mg total) by mouth 2 (two) times daily with a meal.   colchicine 0.6 MG tablet Take 1 tablet (0.6 mg total) by mouth daily.   doxycycline 100 MG tablet Commonly known as: VIBRA-TABS Take 1 tablet (100 mg total) by mouth 2 (two) times daily. 1 po bid Started by: Terald Sleeper,  PA-C   furosemide 20 MG tablet Commonly known as: LASIX Take 1 tablet (20 mg total) by mouth daily.   omeprazole 20 MG tablet Commonly known as: PRILOSEC OTC Take 1 tablet (20 mg total) by mouth daily.   PreserVision AREDS 2 Caps Take 1 capsule by mouth 2 (two) times daily.   Systane Ultra 0.4-0.3 % Soln Generic drug: Polyethyl Glycol-Propyl Glycol Place 1-2 drops into both eyes 2 (two) times daily as needed (dry eyes).   traZODone 150 MG tablet Commonly known as: DESYREL Take 1 tablet (150 mg total) by mouth at bedtime.          Objective:    BP (!) 149/74   Pulse 70   Temp (!) 96.9 F (36.1 C) (Temporal)   Ht 5\' 7"  (1.702 m)   Wt 143 lb 9.6 oz (65.1 kg)   BMI 22.49 kg/m   Allergies  Allergen Reactions  . Ciprofloxacin Other (See Comments)    Possibly caused diarrhea November 2018  . Flagyl [Metronidazole] Other (See Comments)    Possibly caused diarrhea November 2018  . Iron Swelling    Ferrous Sulfate - tongue swelling   . Papaya Derivatives Hives  . Iodine Rash and Other (See Comments)    REACTION:If injected,  Rash/irritated skin reaction "welts"  . Penicillins Hives    DID THE REACTION INVOLVE: Swelling of the face/tongue/throat, SOB, or low BP? Unknown Sudden or severe rash/hives, skin peeling, or the inside of the mouth or nose? Yes Did it require medical treatment? Yes When did it last happen?20 or 85 years old If all above answers are "NO", may proceed with cephalosporin use.  . Sulfa Antibiotics Rash    Wt Readings from Last 3 Encounters:  12/13/18 143 lb 9.6 oz (65.1 kg)  11/16/18 149 lb (67.6 kg)  11/03/18 148 lb (67.1 kg)    Physical Exam Constitutional:      General: She is not in acute distress.    Appearance: Normal appearance. She is well-developed.  HENT:     Head: Normocephalic and atraumatic.  Cardiovascular:     Rate and Rhythm: Normal rate.  Pulmonary:     Effort: Pulmonary effort is normal.  Skin:    General: Skin is  warm and dry.     Findings: Erythema and lesion present. No abscess or rash.       Neurological:     Mental Status: She is alert and oriented to person, place, and time.     Deep Tendon Reflexes: Reflexes are normal and symmetric.           Assessment & Plan:   1.  Cellulitis of left lower extremity - doxycycline (VIBRA-TABS) 100 MG tablet; Take 1 tablet (100 mg total) by mouth 2 (two) times daily. 1 po bid  Dispense: 20 tablet; Refill: 0   Continue all other maintenance medications as listed above.  Follow up plan: Return if symptoms worsen or fail to improve.  Educational handout given for wound care  Terald Sleeper PA-C [facility] [address]  [place], [place] [address] [phone]   12/14/2018, 8:38 PM

## 2018-12-13 NOTE — Patient Instructions (Signed)
End It cream

## 2018-12-14 ENCOUNTER — Telehealth: Payer: Self-pay | Admitting: Family Medicine

## 2018-12-14 ENCOUNTER — Other Ambulatory Visit: Payer: Self-pay | Admitting: Family Medicine

## 2018-12-14 DIAGNOSIS — F319 Bipolar disorder, unspecified: Secondary | ICD-10-CM

## 2018-12-14 MED ORDER — MIRTAZAPINE 7.5 MG PO TABS: 7.5000 mg | ORAL_TABLET | Freq: Every day | ORAL | 1 refills | Status: DC

## 2018-12-14 NOTE — Telephone Encounter (Signed)
She was previously treated with medications for bipolar disorder.  I am going to send mirtazapine 7.5 mg and to help with sleep as well.  However, please advise her that this may increase her risk for serotonin syndrome with use of trazodone.  I have also placed a referral to psychiatry as I think they would be most beneficial in helping with her depression and bipolar disorder.  Please make sure that she schedules a visit with psychiatry for follow-up.

## 2018-12-14 NOTE — Telephone Encounter (Signed)
End It Cream, name of the cream is on her AVS form at the very top left corner

## 2018-12-14 NOTE — Telephone Encounter (Signed)
Patient would like to see if Dr. Darnell Level would do a telephone call to prescribe her something for depression. Please advise and send back to [facility].

## 2018-12-14 NOTE — Telephone Encounter (Signed)
It is okay. Just keep using her chronic lotions

## 2018-12-14 NOTE — Progress Notes (Signed)
Incomplete encounter

## 2018-12-14 NOTE — Telephone Encounter (Signed)
Patient aware and states that [facility] does not have this cream and she has no other way of picking this cream up at any other pharmacy

## 2018-12-14 NOTE — Telephone Encounter (Signed)
Patient aware.

## 2018-12-15 DIAGNOSIS — K259 Gastric ulcer, unspecified as acute or chronic, without hemorrhage or perforation: Secondary | ICD-10-CM | POA: Diagnosis not present

## 2018-12-15 DIAGNOSIS — Z9181 History of falling: Secondary | ICD-10-CM | POA: Diagnosis not present

## 2018-12-15 DIAGNOSIS — Z7901 Long term (current) use of anticoagulants: Secondary | ICD-10-CM | POA: Diagnosis not present

## 2018-12-15 DIAGNOSIS — K5791 Diverticulosis of intestine, part unspecified, without perforation or abscess with bleeding: Secondary | ICD-10-CM | POA: Diagnosis not present

## 2018-12-15 DIAGNOSIS — K208 Other esophagitis: Secondary | ICD-10-CM | POA: Diagnosis not present

## 2018-12-15 DIAGNOSIS — M1990 Unspecified osteoarthritis, unspecified site: Secondary | ICD-10-CM | POA: Diagnosis not present

## 2018-12-15 DIAGNOSIS — Z79891 Long term (current) use of opiate analgesic: Secondary | ICD-10-CM | POA: Diagnosis not present

## 2018-12-15 DIAGNOSIS — N183 Chronic kidney disease, stage 3 (moderate): Secondary | ICD-10-CM | POA: Diagnosis not present

## 2018-12-15 DIAGNOSIS — H409 Unspecified glaucoma: Secondary | ICD-10-CM | POA: Diagnosis not present

## 2018-12-15 DIAGNOSIS — I130 Hypertensive heart and chronic kidney disease with heart failure and stage 1 through stage 4 chronic kidney disease, or unspecified chronic kidney disease: Secondary | ICD-10-CM | POA: Diagnosis not present

## 2018-12-15 DIAGNOSIS — I4821 Permanent atrial fibrillation: Secondary | ICD-10-CM | POA: Diagnosis not present

## 2018-12-15 DIAGNOSIS — I5042 Chronic combined systolic (congestive) and diastolic (congestive) heart failure: Secondary | ICD-10-CM | POA: Diagnosis not present

## 2018-12-15 DIAGNOSIS — L03116 Cellulitis of left lower limb: Secondary | ICD-10-CM | POA: Diagnosis not present

## 2018-12-17 ENCOUNTER — Ambulatory Visit (INDEPENDENT_AMBULATORY_CARE_PROVIDER_SITE_OTHER): Payer: Medicare Other | Admitting: Licensed Clinical Social Worker

## 2018-12-17 DIAGNOSIS — F3178 Bipolar disorder, in full remission, most recent episode mixed: Secondary | ICD-10-CM

## 2018-12-17 DIAGNOSIS — N183 Chronic kidney disease, stage 3 unspecified: Secondary | ICD-10-CM

## 2018-12-17 DIAGNOSIS — I4821 Permanent atrial fibrillation: Secondary | ICD-10-CM

## 2018-12-17 DIAGNOSIS — I10 Essential (primary) hypertension: Secondary | ICD-10-CM | POA: Diagnosis not present

## 2018-12-17 DIAGNOSIS — F331 Major depressive disorder, recurrent, moderate: Secondary | ICD-10-CM

## 2018-12-17 DIAGNOSIS — K219 Gastro-esophageal reflux disease without esophagitis: Secondary | ICD-10-CM

## 2018-12-17 DIAGNOSIS — K279 Peptic ulcer, site unspecified, unspecified as acute or chronic, without hemorrhage or perforation: Secondary | ICD-10-CM

## 2018-12-17 NOTE — Patient Instructions (Addendum)
Licensed Clinical Social Worker Visit Information  Materials Provided: No  Goals    . Client verbalizes she wants to talk with someone about anxiety and stress about managing her health conditions (patient stated) (pt-stated)     Current Barriers:  . Transportation . Social Isolation . Mental Health Challenges  Clinical Social Work Clinical Goal(s):  [name] [name] will talk with client in next 30 days to discuss anxiety and stress issues about managing her health conditions.   Interventions: . Talked with client about CCM program support with [name] and RNCM . Talked with client about current social work needs of client . Talked with client about social support network of client . Talked  with client about relaxation techniques to help client manage stress/anxiety symptoms.   Patient Self Care Activities:  . Takes medications as prescribed . Talks to PCP as needed about medical needs of client  . Attends medical appointments as scheduled  Plan: Client to call [name] to discuss social work needs of client Client to talk with RNCM about nursing needs of client [name] to call client in next 3 weeks to assess psychosocial needs of client  Initial goal documentation       Follow Up Plan: [name] to call client in next 3 weeks to assess psychosocial needs of client   The patient verbalized understanding of instructions provided today and declined a print copy of patient instruction materials.   [name].Timika Muench MSW, [name] Licensed Clinical Social Worker [facility] [phone]

## 2018-12-17 NOTE — Chronic Care Management (AMB) (Signed)
  Care Management Note   Kimberly [name] is a 85 y.o. year old female who is a primary care patient of [name], DO. The CM team was consulted for assistance with chronic disease management and care coordination.   I reached out to [name] by phone today.   Review of patient status, including review of consultants reports, relevant laboratory and other test results, and collaboration with appropriate care team members and the patient's provider was performed as part of comprehensive patient evaluation and provision of chronic care management services.   Social Determinants of Health: risk for social isolation; risk for tobacco use; risk for depression\    Office Visit from 12/13/2018 in [facility]  PHQ-9 Total Score  10     Goals    . Client verbalizes she wants to talk with someone about anxiety and stress about managing her health conditions (patient stated) (pt-stated)     Current Barriers:  . Transportation . Social Isolation . Mental Health Challenges  Clinical Social Work Clinical Goal(s):  [name] [name] will talk with client in next 30 days to discuss anxiety and stress issues about managing her health conditions.   Interventions: . Talked with client about CCM program support with [name] and RNCM . Talked with client about current social work needs of client . Talked with client about social support network of client . Talked  with client about relaxation techniques to help client manage stress/anxiety symptoms.   Patient Self Care Activities:  . Takes medications as prescribed . Talks to PCP as needed about medical needs of client  . Attends medical appointments as scheduled  Plan: Client to call [name] to discuss social work needs of client Client to talk with RNCM about nursing needs of client [name] to call client in next 3 weeks to assess psychosocial needs of client  Initial goal documentation       Client has prescribed medications  and is taking medications as prescribed.  Client said she has appointment with cardiologist in [place], [name] in late August of 2020.  Client has a neighbor who helps client occasionally.  Client said she is eating well. Client watches TV to relax.  Client said she still has a scab area on her left leg.  She said she is using lotion on scabbed area.  She spoke of pain issues.  She spoke of arthritis issues. She talks via phone with her daughter several times weekly .  She said she is seeing well.  [name] encouraged client to talk with RNCM to discuss nursing needs of client.    Follow Up Plan: [name] to call client in next 3 weeks to assess the psychosocial needs of client  [name].Zhavia Cunanan MSW, [name] Licensed Clinical Social Worker [facility] [phone]

## 2018-12-22 ENCOUNTER — Telehealth: Payer: Self-pay | Admitting: Family Medicine

## 2018-12-22 ENCOUNTER — Other Ambulatory Visit: Payer: Self-pay

## 2018-12-22 ENCOUNTER — Telehealth: Payer: Self-pay | Admitting: Cardiovascular Disease

## 2018-12-22 NOTE — Telephone Encounter (Signed)
The patient has been made aware that is was probably a reminder that she has an appointment on 8/24.

## 2018-12-22 NOTE — Telephone Encounter (Signed)
appt rescheduled. Pt notified.

## 2018-12-22 NOTE — Telephone Encounter (Signed)
Follow Up:   Pt said she was returning a call from this morning. She does not know who it was.

## 2018-12-23 ENCOUNTER — Ambulatory Visit: Payer: Medicare Other | Admitting: Family Medicine

## 2018-12-23 NOTE — Telephone Encounter (Signed)
Phone call taken care of in different encounter.  This encounter will now be closed  

## 2018-12-24 ENCOUNTER — Other Ambulatory Visit: Payer: Self-pay | Admitting: Cardiovascular Disease

## 2018-12-24 NOTE — Telephone Encounter (Signed)
Please review for refill. Last filled by Orson Eva, MD. Thank you!

## 2018-12-27 ENCOUNTER — Other Ambulatory Visit: Payer: Self-pay

## 2018-12-27 ENCOUNTER — Ambulatory Visit (INDEPENDENT_AMBULATORY_CARE_PROVIDER_SITE_OTHER): Payer: Medicare Other | Admitting: Cardiovascular Disease

## 2018-12-27 VITALS — BP 156/78 | HR 73 | Temp 97.3°F | Wt 139.0 lb

## 2018-12-27 DIAGNOSIS — I5032 Chronic diastolic (congestive) heart failure: Secondary | ICD-10-CM | POA: Diagnosis not present

## 2018-12-27 DIAGNOSIS — Z950 Presence of cardiac pacemaker: Secondary | ICD-10-CM | POA: Diagnosis not present

## 2018-12-27 DIAGNOSIS — I4821 Permanent atrial fibrillation: Secondary | ICD-10-CM

## 2018-12-27 DIAGNOSIS — R6889 Other general symptoms and signs: Secondary | ICD-10-CM | POA: Diagnosis not present

## 2018-12-27 DIAGNOSIS — I442 Atrioventricular block, complete: Secondary | ICD-10-CM | POA: Diagnosis not present

## 2018-12-27 NOTE — Telephone Encounter (Signed)
40f 65.1kg Scr 1.25 11/17/18 Lovw/croitoru 12/27/18 Pt qualifies for the 5mg  eliquis but requested 2.5 pharmd please address

## 2018-12-27 NOTE — Progress Notes (Signed)
Cardiology Office Note    Date:  12/28/2018   ID:  Kimberly [name], DOB 08/15/1926, MRN [id number]  PCP:  [name], DO  Cardiologist:   Sanda Klein, MD   Chief Complaint  Patient presents with  . Pacemaker Check    CRT-P s/p AV node ablation  . Atrial Fibrillation    History of Present Illness:  Kimberly [name] is a 85 y.o. female with a history of permanent atrial fibrillation with AV node ablation and secondary complete heart block here for follow-up on her CRT-P device. She has mild diastolic heart failure.  She is doing very well from a cardiac point of view and has no complaints.The patient specifically denies any chest pain at rest exertion, dyspnea at rest or with exertion, orthopnea, paroxysmal nocturnal dyspnea, syncope, palpitations, focal neurological deficits, intermittent claudication, lower extremity edema, unexplained weight gain, cough, hemoptysis or wheezing.  Around last year's holiday season she had a period of mania and eventually was hospitalized after a fall.  During that time her activity level increased to over 6 hours/day but has now returned to her previous baseline of less than 1 hour/day.  She seems to be very well balanced emotionally at this time.  She has not had any recent falls, injuries or bleeding problems, although she bruises easily.  Pacemaker interrogation shows normal device function.  Her current generator was implanted in 2012 (right ventricular lead from 1997, coronary sinus lead from 2007).  The atrial port is pinned.  Has approximately 96% biventricular pacing (programmed VVIR with LV first)..  Estimated generator longevity is about 12 months.  Activity level is down to 0.4 hours a day.  Her optivol has been rather volatile this year but has not really correlated with clinical signs of symptoms of hypervolemia.  Is been normal over the last couple of weeks.  Her most recent echocardiogram performed July 2016 showed left ventricular  ejection fraction 60-65 %, severe biatrial dilatation, minor valvular abnormalities, peak PA systolic pressure 39 mmHg.  Kimberly [name] has a long-standing history of permanent atrial fibrillation s/p AV node ablation, complete heart block and nonischemic cardiomyopathy with an excellent response to cardiac resynchronization therapy. She originally had a CRT-D device but at the time of generator change out was "downgraded" to a CRT-P. since her left ventricular ejection fraction increased to 50-55%. Her current device is a Risk analyst CRT-P [id number] implanted in June 2012. Her defibrillator lead, which was a Sprint fidelis lead is capped and an older RV pacemaker lead Medtronic [id number] implanted 1997 is being used. The left ventricular lead is a Medtronic [id number] implanted in 2007. She has long-standing problems with chronic scoliosis and fibromyalgia.  In December 2015 she was hospitalized in [place] and received 2 units of blood transfusion. No bleeding events since that time. Hospitalized in January 2016 after a fall at home,  Possibly precipitated by dehydration complicated by acute on chronic renal failure.   Past Medical History:  Diagnosis Date  . Acute blood loss anemia 10/11/2012  . Acute diverticulitis 08/24/2013  . Acute on chronic combined systolic and diastolic CHF, NYHA class 4 ([place]) 11/15/2013  . Antral ulcer 10/11/2012  . Arrhythmia    atrial fibb  . Atrial fibrillation ([place])   . Cardiomyopathy, nonischemic ([place])   . Chronic anticoagulation 10/12/2012  . CKD (chronic kidney disease) stage 3, GFR 30-59 ml/min (HCC) 10/12/2012  . Depression   . Erosive esophagitis 10/11/2012  . Fibromyalgia   . Glaucoma   .  H/O echocardiogram 2007   EF 40-45%,         . Hypertension   . Osteoarthritis   . Pacemaker    Last saw cards 07/2013  . Scoliosis     Past Surgical History:  Procedure Laterality Date  . ABDOMINAL HYSTERECTOMY    . APPENDECTOMY    . BACK SURGERY    . BIOPSY  03/03/2017    Procedure: BIOPSY;  Surgeon: Danie Binder, MD;  [facility];  Service: Endoscopy;;  gastric  . BREAST SURGERY    . CARDIAC CATHETERIZATION  12/08/2005   LAD AND LEFT MAIN WITH NO HIGH-GRADE STENOSIS. MILD DISEASE IN THE CX AND LAD SYSTEM. SEVERE LV DYSFUNCTION WITH DILATION OF THE LV. EF 15-20%. LV END-DIASTOLIC PRESSURE IS 90. +1 MR.  . CHOLECYSTECTOMY    . COLONOSCOPY N/A 03/03/2017   Procedure: COLONOSCOPY;  Surgeon: Danie Binder, MD;  Location[facility];  Service: Endoscopy;  Laterality: N/A;  . CYSTOSCOPY N/A 02/24/2013   Procedure: CYSTOSCOPY WITH URETHRAL DILITATION;  Surgeon: Marissa Nestle, MD;  Location: [facility];  Service: Urology;  Laterality: N/A;  . DOPPLER ECHOCARDIOGRAPHY N/A 05/30/2010   LV SIZE IS NORMAL. LV SYSTOLIC FUNCTION IS LOW NORMAL. EF=50-55%. MILD INFERIOR HYPOKINESIS.MILD TO MODERATE POSTERIOR WALL HYPOKINESIS.PACEMAKER LEAD IN THE RV. LA IS MILDLY DILATED. RA IS MODERATE TO SEVERLY DILATED. PACEMAKER LEAD IN THE RA. MILD CALCICICATION OF THE MV APPARATUS. MODERATE MR. MILD TO MODERATE TR. MILD PHTN.AV MILDLY SCLEROTIC.  [name] ESOPHAGOGASTRODUODENOSCOPY N/A 10/13/2012   Dr. Gala Romney: severe ulcerative reflux esophagitis, question of Barrett's but negative path, single deep prepyloric antral ulcer, negative H.pylori  . ESOPHAGOGASTRODUODENOSCOPY N/A 03/03/2017   Procedure: ESOPHAGOGASTRODUODENOSCOPY (EGD);  Surgeon: Danie Binder, MD;  [facility];  Service: Endoscopy;  Laterality: N/A;  . HERNIA REPAIR     right inguinal hernia and umbilical  . LOWETR EXT VENOUS Bilateral 11-08-10   R & L- NO EVIDENCE OF THROMBUS OR THROMBOPHLEBITIS. THERE IS MILD AMOUNT OF SUBCUTANEOUS EDEMA NOTED WITHIN THE LEFT CALF AND ANKLE. R & L GSV AND SSV- NO VENOUS INSUFF NOTED.  [name] NECK SURGERY    . NUCLEAR STRESS TEST N/A 02/13/2009   NORMAL PATTERN OF PERFUSION IN ALL REGIONS. POST STRESS VENTICULAR SIZE IS NORMAL. POST  STESS EF 85%.  NORMAL MYOCARDIAL PERFUSION  STUDY.  [name] OPEN REDUCTION INTERNAL FIXATION (ORIF) DISTAL PHALANX Left 11/16/2018   Procedure: MIDDLE FINGER OPEN REDUCTION VERSUS RECONSTRUCTION;  Surgeon: Roseanne Kaufman, MD;  Location: [facility];  Service: Orthopedics;  Laterality: Left;  . PACEMAKER INSERTION    . POLYPECTOMY  03/03/2017   Procedure: POLYPECTOMY;  Surgeon: Danie Binder, MD;  [facility];  Service: Endoscopy;;  colon  . TONSILLECTOMY    . YAG LASER APPLICATION Bilateral 3/71/0626   Procedure: YAG LASER APPLICATION;  Surgeon: Williams Che, MD;  Location: [facility];  Service: Ophthalmology;  Laterality: Bilateral;    Current Medications: Outpatient Medications Prior to Visit  Medication Sig Dispense Refill  . acetaminophen (TYLENOL) 325 MG tablet Take 2 tablets (650 mg total) by mouth every 6 (six) hours as needed for mild pain, fever or headache (or Fever >/= 101). 12 tablet 0  . carvedilol (COREG) 6.25 MG tablet Take 1 tablet (6.25 mg total) by mouth 2 (two) times daily with a meal. 60 tablet 2  . colchicine 0.6 MG tablet Take 1 tablet (0.6 mg total) by mouth daily. 14 tablet 0  . doxycycline (VIBRA-TABS)  100 MG tablet Take 1 tablet (100 mg total) by mouth 2 (two) times daily. 1 po bid 20 tablet 0  . furosemide (LASIX) 20 MG tablet Take 1 tablet (20 mg total) by mouth daily. 30 tablet 3  . mirtazapine (REMERON) 7.5 MG tablet Take 1 tablet (7.5 mg total) by mouth at bedtime. 30 tablet 1  . Multiple Vitamins-Minerals (PRESERVISION AREDS 2) CAPS Take 1 capsule by mouth 2 (two) times daily.     [name] omeprazole (PRILOSEC OTC) 20 MG tablet Take 1 tablet (20 mg total) by mouth daily. 30 tablet 0  . Polyethyl Glycol-Propyl Glycol (SYSTANE ULTRA) 0.4-0.3 % SOLN Place 1-2 drops into both eyes 2 (two) times daily as needed (dry eyes).     . traZODone (DESYREL) 150 MG tablet Take 1 tablet (150 mg total) by mouth at bedtime. 30 tablet 3  . apixaban (ELIQUIS) 2.5 MG TABS tablet Take 1 tablet (2.5 mg total) by mouth 2 (two) times  daily. Restart on 11/01/18 60 tablet 1   No facility-administered medications prior to visit.      Allergies:   Ciprofloxacin, Flagyl [metronidazole], Iron, Papaya derivatives, Iodine, Penicillins, and Sulfa antibiotics   Social History   Socioeconomic History  . Marital status: Widowed    Spouse name: Not on file  . Number of children: Not on file  . Years of education: Not on file  . Highest education level: Some college, no degree  Occupational History  . 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 Financial resource strain: Not very hard  . Food insecurity    Worry: Never true    Inability: Never true  . Transportation needs    Medical: No    Non-medical: No  Tobacco Use  . Smoking status: Former Smoker    Packs/day: 0.50    Years: 30.00    Pack years: 15.00    Types: Cigarettes    Quit date: 12/13/2004    Years since quitting: 14.0  . Smokeless tobacco: Never Used  . Tobacco comment: former smoker  Substance and Sexual Activity  . Alcohol use: Yes    Alcohol/week: 0.0 standard drinks    Comment: history of drinking a 1/2 glass of wine in the evening  . Drug use: No  . Sexual activity: Never  Lifestyle  . Physical activity    Days per week: 0 days    Minutes per session: Not on file  . Stress: Only a little  Relationships  . Social Herbalist on phone: Once a week    Gets together: Never    Attends religious service: Never    Active member of club or organization: No    Attends meetings of clubs or organizations: Never    Relationship status: Widowed  Other Topics Concern  . Not on file  Social History Narrative   No home exercise program. PT ordered this week.     Family History:  The patient's family history includes Asthma in her sister; Cancer in her brother and sister; Heart failure in her brother; Pulmonary embolism in her brother.   ROS:   Please see the history of present illness.    All  other systems are reviewed and are negative  PHYSICAL EXAM:   VS:  BP (!) 156/78   Pulse 73   Temp (!) 97.3 F (36.3 C)   Wt 139 lb (63 kg)   SpO2 99%  BMI 21.77 kg/m      General: Alert, oriented x3, no distress, appears well.  Healthy subclavian pacemaker site.  Prominent scoliosis. Head: no evidence of trauma, PERRL, EOMI, no exophtalmos or lid lag, no myxedema, no xanthelasma; normal ears, nose and oropharynx Neck: normal jugular venous pulsations and no hepatojugular reflux; brisk carotid pulses without delay and no carotid bruits Chest: clear to auscultation, no signs of consolidation by percussion or palpation, normal fremitus, symmetrical and full respiratory excursions Cardiovascular: normal position and quality of the apical impulse, regular rhythm, normal first and second heart sounds, no murmurs, rubs or gallops Abdomen: no tenderness or distention, no masses by palpation, no abnormal pulsatility or arterial bruits, normal bowel sounds, no hepatosplenomegaly Extremities: no clubbing, cyanosis or edema; 2+ radial, ulnar and brachial pulses bilaterally; 2+ right femoral, posterior tibial and dorsalis pedis pulses; 2+ left femoral, posterior tibial and dorsalis pedis pulses; no subclavian or femoral bruits Neurological: grossly nonfocal Psych: Normal mood and affect     Wt Readings from Last 3 Encounters:  12/27/18 139 lb (63 kg)  12/13/18 143 lb 9.6 oz (65.1 kg)  11/16/18 149 lb (67.6 kg)      Studies/Labs Reviewed:   EKG:  EKG is not ordered today.  ECG November 17, 2018 shows atrial fibrillation with biventricular pacing and positive R wave in lead V1. Recent Labs: 04/19/2018: TSH 3.615 04/25/2018: B Natriuretic Peptide 270.0 10/26/2018: Magnesium 2.2 11/03/2018: ALT 7 11/17/2018: BUN 22; Creatinine, Ser 1.25; Hemoglobin 9.4; Platelets 157; Potassium 4.2; Sodium 135   Lipid Panel    Component Value Date/Time   CHOL 179 03/15/2018 1645   TRIG 65 03/15/2018 1645    HDL 84 03/15/2018 1645   CHOLHDL 2.1 03/15/2018 1645   CHOLHDL 2.3 02/06/2009 0450   VLDL 16 02/06/2009 0450   LDLCALC 82 03/15/2018 1645    ASSESSMENT:    1. Permanent atrial fibrillation   2. Heart block AV complete ([place])   3. Biventricular cardiac pacemaker in situ   4. Chronic diastolic (congestive) heart failure (HCC)      PLAN:  In order of problems listed above:  1. AFib s/p AV node ablation: She is quite elderly, has had serious falls and her body habitus is at the borderline for the lower dose of apixaban so we have kept her on 2.5 mg twice daily.  She has never had a stroke, TIA or other embolic event. 2. CHB: Pacemaker dependent after AV node ablation 3. CRT-P: Normal device function, anticipate need for generator change in about 1 year.  She is pacemaker dependent. 4. CHF: CRT hyper responder with recovered left ventricular ejection fraction, most recently estimated at 60%.  Although her optivol has shown some variability, she has not had clinical heart failure exacerbation.  She is on a tiny dose of loop diuretic activity appears euvolemic both clinically and by thoracic impedance.    Medication Adjustments/Labs and Tests Ordered: Current medicines are reviewed at length with the patient today.  Concerns regarding medicines are outlined above.  Medication changes, Labs and Tests ordered today are listed in the Patient Instructions below. Patient Instructions  Medication Instructions:  Your physician recommends that you continue on your current medications as directed. Please refer to the Current Medication list given to you today.  If you need a refill on your cardiac medications before your next appointment, please call your pharmacy.   Lab work: None ordered If you have labs (blood work) drawn today and your tests are completely normal,  you will receive your results only by: [place] (if you have MyChart) OR A paper copy in the mail If you have any lab  test that is abnormal or we need to change your treatment, we will call you to review the results.  Testing/Procedures: None ordered  Follow-Up: At [facility], you and your health needs are our priority.  As part of our continuing mission to provide you with exceptional heart care, we have created designated Provider Care Teams.  These Care Teams include your primary Cardiologist (physician) and Advanced Practice Providers (APPs -  Physician Assistants and Nurse Practitioners) who all work together to provide you with the care you need, when you need it. You will need a follow up appointment in 12 months.  Please call our office 2 months in advance to schedule this appointment.  You may see Sanda Klein, MD or one of the following Advanced Practice Providers on your designated Care Team: Almyra Deforest, PA-C Fabian Sharp, [place]          Signed, Sanda Klein, MD  12/28/2018 4:55 PM    [address], [place], [place]  [address] Phone: [phone]; Fax: [phone]

## 2018-12-27 NOTE — Patient Instructions (Signed)

## 2018-12-27 NOTE — Telephone Encounter (Signed)
Please leave on 2.5 mg BID dose. Via Dr. Sallyanne Kuster in secure chat on epic

## 2018-12-28 ENCOUNTER — Encounter: Payer: Self-pay | Admitting: Cardiovascular Disease

## 2018-12-29 ENCOUNTER — Ambulatory Visit: Payer: Medicare Other | Admitting: *Deleted

## 2018-12-29 DIAGNOSIS — I4821 Permanent atrial fibrillation: Secondary | ICD-10-CM

## 2018-12-29 DIAGNOSIS — Z7409 Other reduced mobility: Secondary | ICD-10-CM

## 2018-12-29 DIAGNOSIS — K279 Peptic ulcer, site unspecified, unspecified as acute or chronic, without hemorrhage or perforation: Secondary | ICD-10-CM

## 2018-12-29 DIAGNOSIS — I5032 Chronic diastolic (congestive) heart failure: Secondary | ICD-10-CM

## 2018-12-29 NOTE — Chronic Care Management (AMB) (Signed)
  Chronic Care Management   Outreach Note  12/29/2018 Name: Kimberly [name] MRN: [id number] DOB: February 04, 1927  Referred by: [name], DO Reason for referral : Chronic Care Management (RNCM follow Up)   An unsuccessful telephone outreach was attempted today. The patient was referred to the case management team by for assistance with chronic care management and care coordination.  Chart review performed prior to outreach. Recent f/u with cardiologist 2 days ago. Appears stable from a cardiac standpoint. Has talked with Legrand Como "Scott" Forrest, [name] with the [facility] CCM Team regarding her psychosocial needs. She's had multiple visits regarding wound on left lower leg that she sustained in 04/2018. She has had cellulitis and slow wound healing. During our last conversation we discussed PUD management. I would like to follow up on that as well as her leg wound and other chronic conditions as needed.     Follow Up Plan: A HIPPA compliant phone message was left for the patient providing contact information and requesting a return call.  The care management team will reach out to the patient again over the next 14 days.   [name] BSN, RN-BC Embedded Chronic Care Manager [facility] / [place] Management Direct Dial: [phone]

## 2018-12-29 NOTE — Patient Instructions (Signed)
  Follow Up Plan: A HIPPA compliant phone message was left for the patient providing contact information and requesting a return call.  The care management team will reach out to the patient again over the next 14 days.   [name] BSN, RN-BC Embedded Chronic Care Manager [facility] / [place] Management Direct Dial: [phone]

## 2018-12-31 ENCOUNTER — Ambulatory Visit: Payer: Medicare Other | Admitting: Gastroenterology

## 2018-12-31 ENCOUNTER — Ambulatory Visit: Payer: Medicare Other | Admitting: Family Medicine

## 2019-01-04 IMAGING — CT CT ABD-PELV W/ CM
2 of 5 series · 17 of 46 positions shown, 19 images · IV contrast (Isovue)
Comparison: 03/09/2017.

CLINICAL DATA: Severe lower abdominal pain. Blood in stools today.
Colonoscopy 2 weeks ago. Diarrhea for the past 2 days.

EXAM:
CT ABDOMEN AND PELVIS WITH CONTRAST
TECHNIQUE: Multidetector CT imaging of the abdomen and pelvis was performed
using the standard protocol following bolus administration of
intravenous contrast.
CONTRAST:  75mL [id number] IOPAMIDOL ([id number]) INJECTION 61%

[Series 2: axial st · axial · 0.79mm/px · z∈[+825,+1165]mm · 14 of 76 slices shown, 16 images]
[im 4/76  soft-tissue]
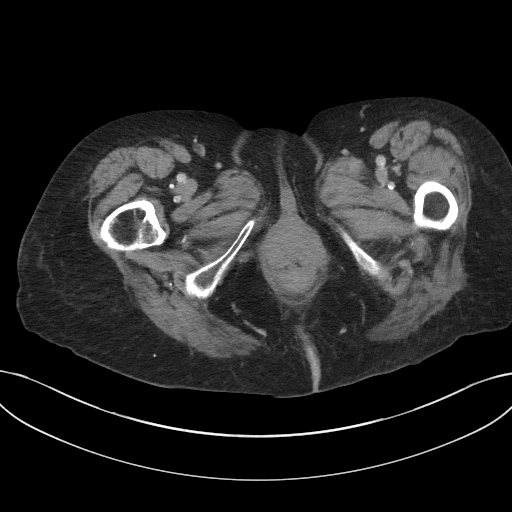
[im 4/76  bone]
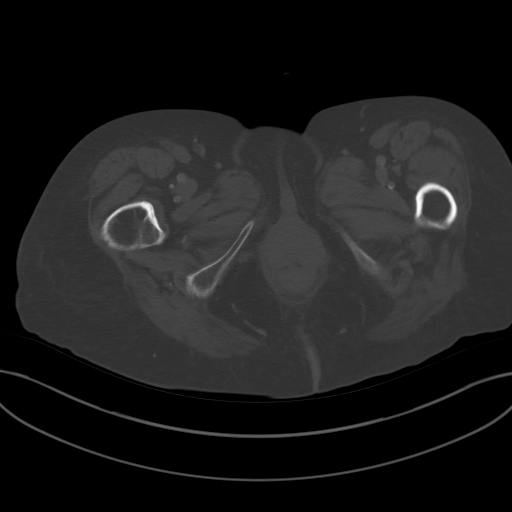
[im 12/76  soft-tissue]
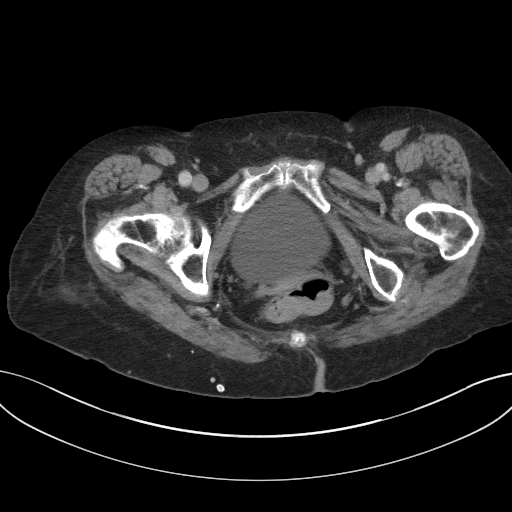
[im 16/76  soft-tissue]
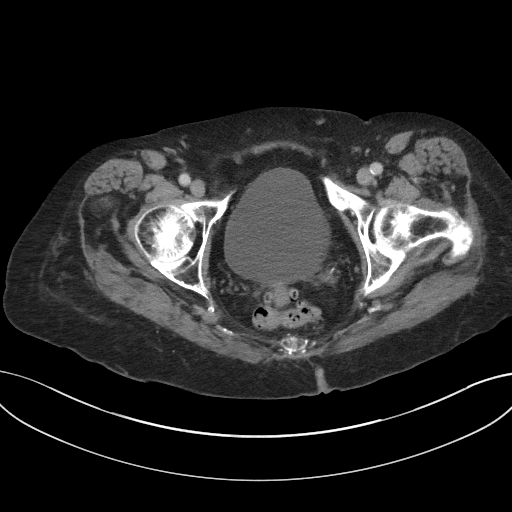
[im 19/76  soft-tissue]
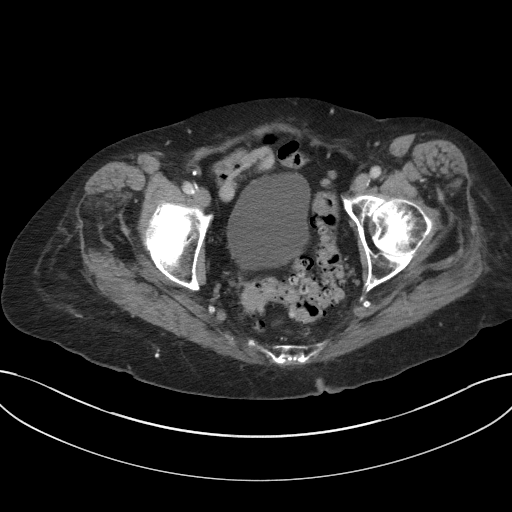
[im 27/76  soft-tissue]
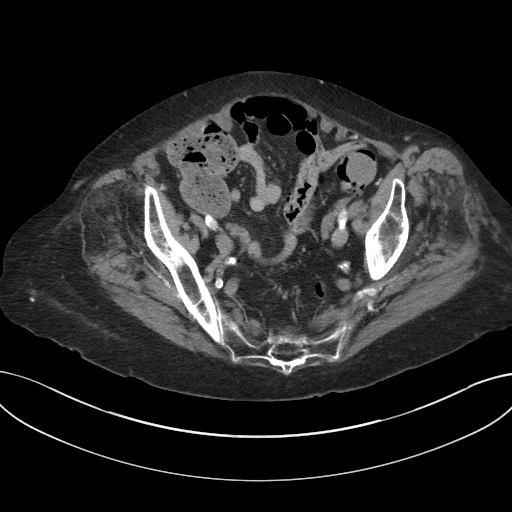
[im 31/76  soft-tissue]
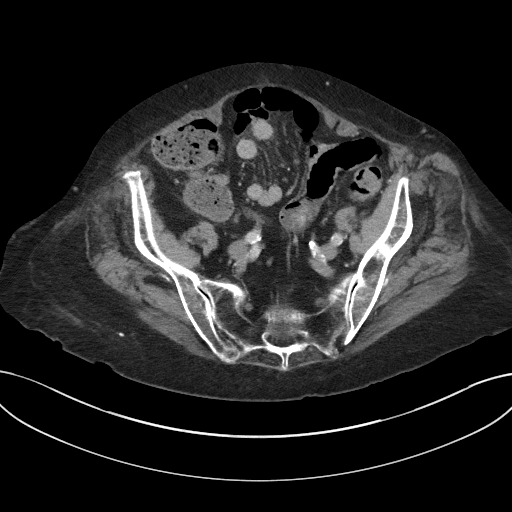
[im 34/76  soft-tissue]
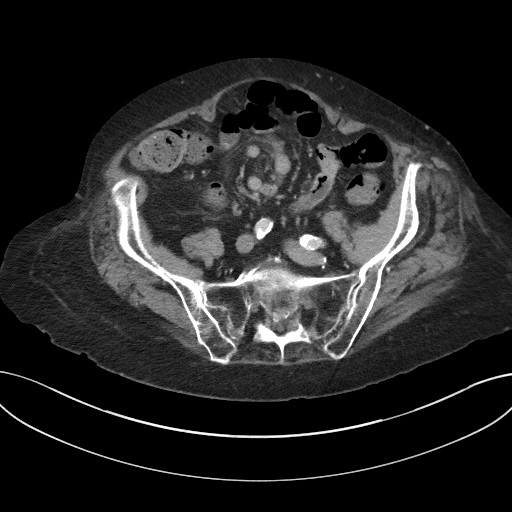
[im 42/76  soft-tissue]
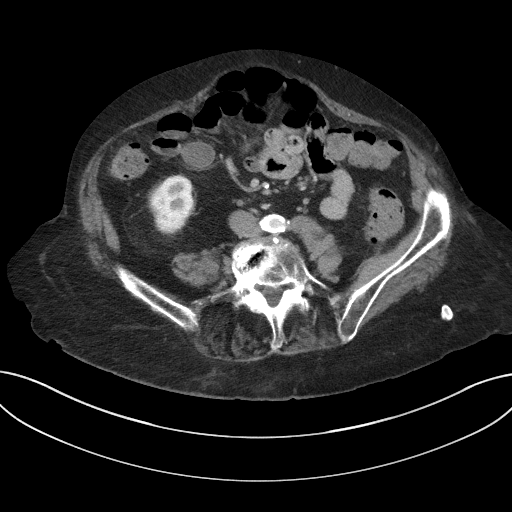
[im 46/76  soft-tissue]
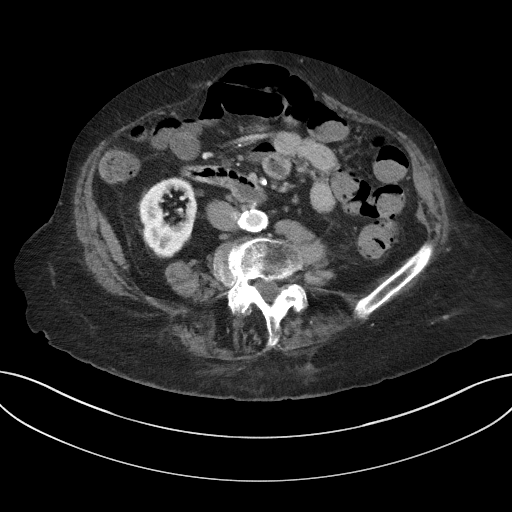
[im 46/76  bone]
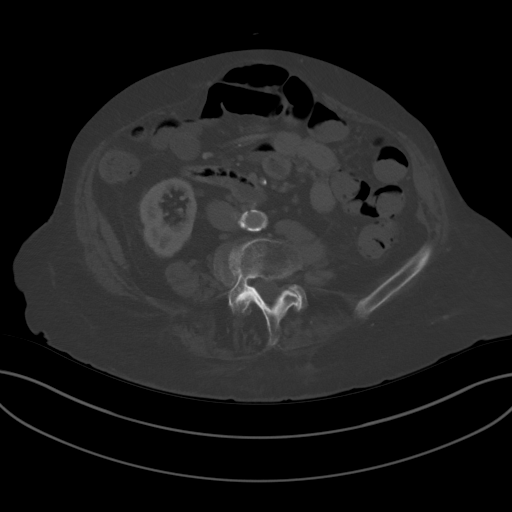
[im 49/76  soft-tissue]
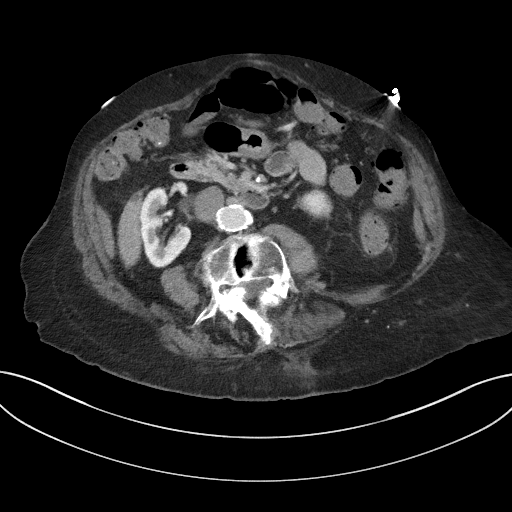
[im 57/76  soft-tissue]
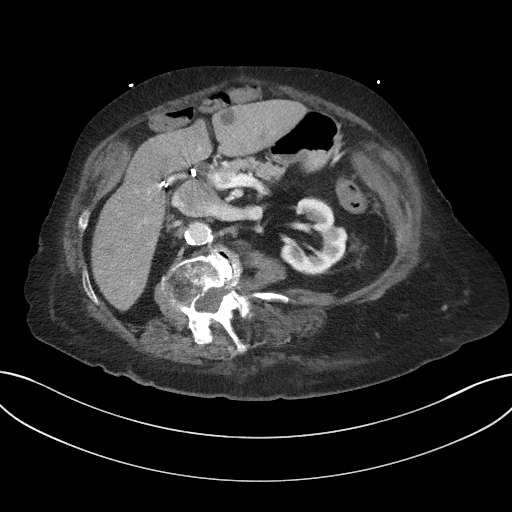
[im 61/76  soft-tissue]
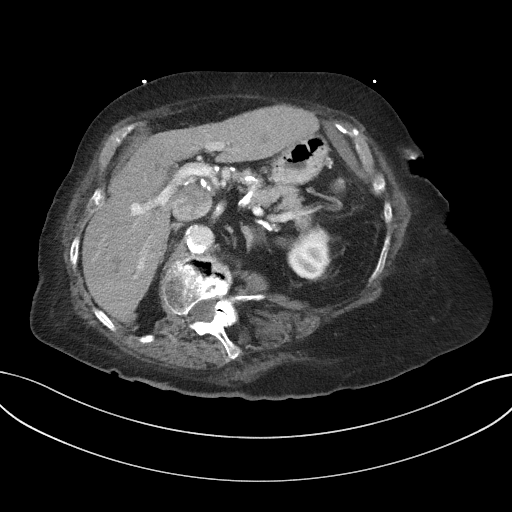
[im 64/76  soft-tissue]
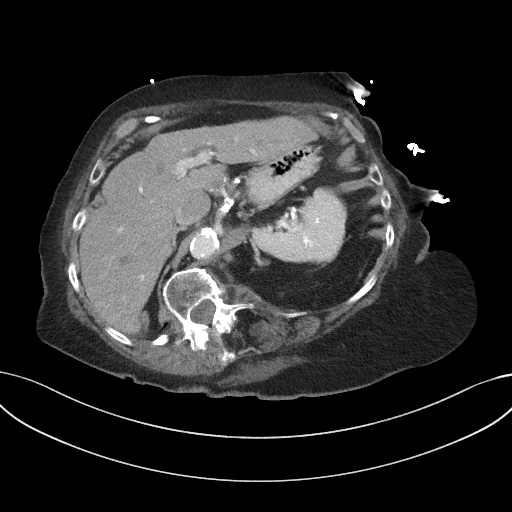
[im 72/76  soft-tissue]
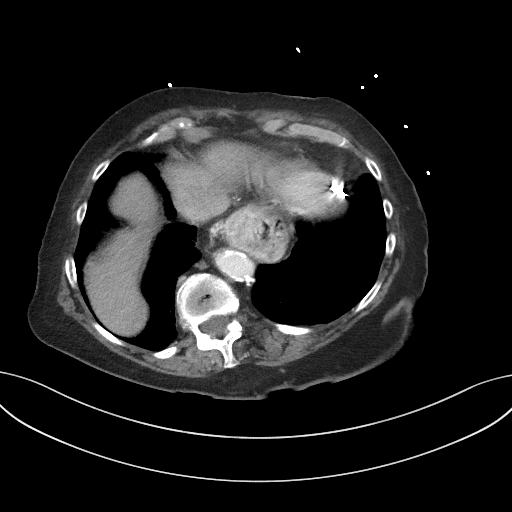

[Series 5: coronal st · coronal · 0.74mm/px · 3 of 98 slices shown]
[im 33/98  soft-tissue]
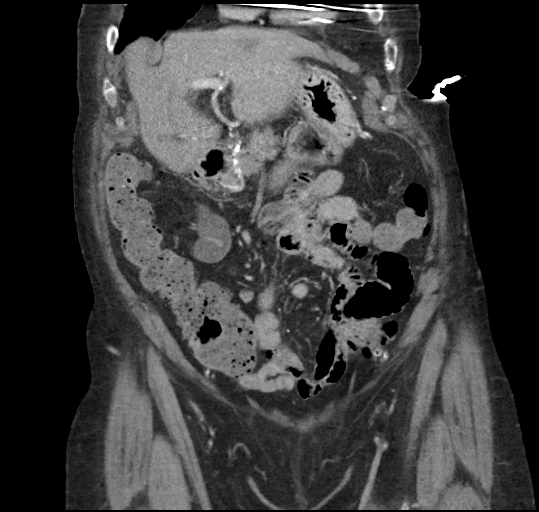
[im 44/98  soft-tissue]
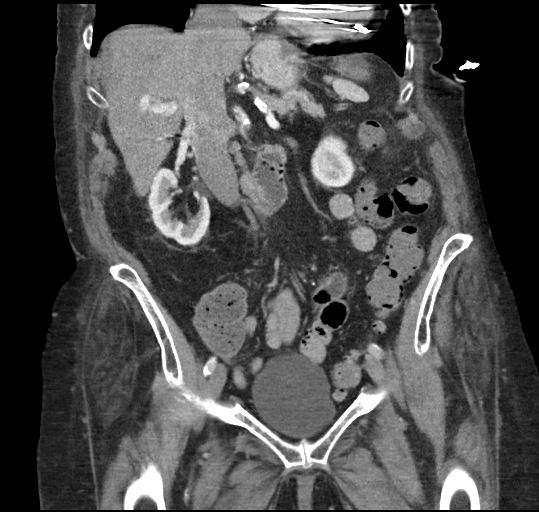
[im 54/98  soft-tissue]
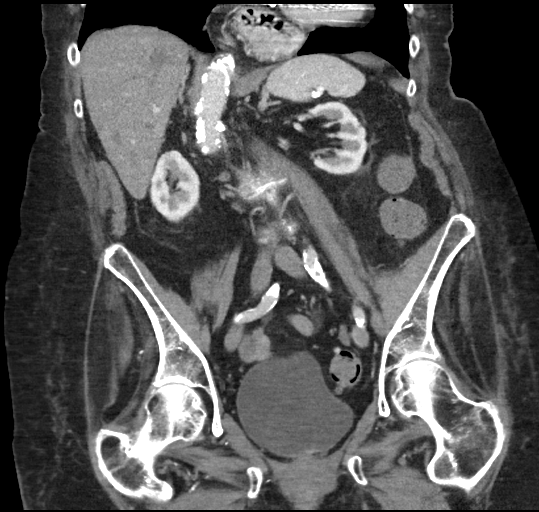

[17 of 46 positions shown; findings below may reference images not displayed]

FINDINGS: Lower chest: A moderate-sized hiatal hernia is again demonstrated.
Intracardiac pacer and AICD leads. No acute abnormality.

Hepatobiliary: Multiple liver cysts are again demonstrated.
Cholecystectomy clips.

Pancreas: Unremarkable. No pancreatic ductal dilatation or
surrounding inflammatory changes.

Spleen: Normal in size without focal abnormality.

Adrenals/Urinary Tract: Adrenal glands are unremarkable. Kidneys are
normal, without renal calculi, focal lesion, or hydronephrosis.
Bladder is unremarkable.

Stomach/Bowel: Multiple sigmoid colon diverticula are again
demonstrated. Moderate-sized hiatal hernia. Unremarkable small
bowel. No evidence of appendicitis.

Vascular/Lymphatic: Atheromatous arterial calcifications without
aneurysm. No enlarged lymph nodes.

Reproductive: Status post hysterectomy. No adnexal masses.

Other: Previously noted small umbilical hernia containing fat.

Musculoskeletal: Stable marked lumbar spine degenerative changes,
marked dextroconvex scoliosis and old T12 vertebral compression
deformity.
IMPRESSION: 1. No acute abnormality.
2. Stable moderate-sized hiatal hernia.
3. Sigmoid diverticulosis.

## 2019-01-05 ENCOUNTER — Ambulatory Visit: Payer: Medicare Other | Admitting: *Deleted

## 2019-01-05 DIAGNOSIS — I10 Essential (primary) hypertension: Secondary | ICD-10-CM

## 2019-01-05 DIAGNOSIS — I4821 Permanent atrial fibrillation: Secondary | ICD-10-CM

## 2019-01-05 NOTE — Chronic Care Management (AMB) (Signed)
  Chronic Care Management   Follow up Attempt  01/05/2019 Name: Kimberly [name] MRN: [id number] DOB: 22-Oct-1926   I unsuccessfully attempted to reach [name] today for a CCM follow up call.  Chart review performed in preparation for today's visit. Her last appointment was with her cardiologist, Dr Sallyanne Kuster on 12/27/18 for Afib and was recommended to f/u in 12 months.    Follow up plan: A HIPPA compliant phone message was left for the patient providing contact information and requesting a return call.  The care management team will reach out to the patient again over the next 7 days.    [name] BSN, RN-BC Embedded Chronic Care Manager [facility] / [place] Management Direct Dial: [phone]

## 2019-01-05 NOTE — Patient Instructions (Signed)
   Follow up plan: A HIPPA compliant phone message was left for the patient providing contact information and requesting a return call.  The care management team will reach out to the patient again over the next 7 days.    [name] BSN, RN-BC Embedded Chronic Care Manager [facility] / [place] Management Direct Dial: [phone]

## 2019-01-11 ENCOUNTER — Ambulatory Visit (INDEPENDENT_AMBULATORY_CARE_PROVIDER_SITE_OTHER): Payer: Medicare Other | Admitting: Licensed Clinical Social Worker

## 2019-01-11 DIAGNOSIS — N183 Chronic kidney disease, stage 3 unspecified: Secondary | ICD-10-CM

## 2019-01-11 DIAGNOSIS — K219 Gastro-esophageal reflux disease without esophagitis: Secondary | ICD-10-CM

## 2019-01-11 DIAGNOSIS — I10 Essential (primary) hypertension: Secondary | ICD-10-CM

## 2019-01-11 DIAGNOSIS — K279 Peptic ulcer, site unspecified, unspecified as acute or chronic, without hemorrhage or perforation: Secondary | ICD-10-CM

## 2019-01-11 DIAGNOSIS — I4821 Permanent atrial fibrillation: Secondary | ICD-10-CM | POA: Diagnosis not present

## 2019-01-11 DIAGNOSIS — F331 Major depressive disorder, recurrent, moderate: Secondary | ICD-10-CM

## 2019-01-11 DIAGNOSIS — F3178 Bipolar disorder, in full remission, most recent episode mixed: Secondary | ICD-10-CM

## 2019-01-11 NOTE — Chronic Care Management (AMB) (Signed)
  Care Management Note   Kimberly [name] is a 85 y.o. year old female who is a primary care patient of Kimberly Norlander, DO. The CM team was consulted for assistance with chronic disease management and care coordination.   I reached out to Kimberly [name] by phone today.   Review of patient status, including review of consultants reports, relevant laboratory and other test results, and collaboration with appropriate care team members and the patient's provider was performed as part of comprehensive patient evaluation and provision of chronic care management services.   Social Determinants of Health: risk of social isolation; risk of tobacco use; risk of depression    Office Visit from 12/13/2018 in [facility]  PHQ-9 Total Score  10     Medications    acetaminophen (TYLENOL) 325 MG tablet    carvedilol (COREG) 6.25 MG tablet    colchicine 0.6 MG tablet    doxycycline (VIBRA-TABS) 100 MG tablet    ELIQUIS 2.5 MG TABS tablet    furosemide (LASIX) 20 MG tablet    mirtazapine (REMERON) 7.5 MG tablet    Multiple Vitamins-Minerals (PRESERVISION AREDS 2) CAPS    omeprazole (PRILOSEC OTC) 20 MG tablet    Polyethyl Glycol-Propyl Glycol (SYSTANE ULTRA) 0.4-0.3 % SOLN    traZODone (DESYREL) 150 MG tablet     Goals    . Client verbalizes she wants to talk with someone about anxiety and stress about managing her health conditions (patient stated) (pt-stated)     Current Barriers:  . Transportation . Social Isolation . Mental Health Challenges  Clinical Social Work Clinical Goal(s):  Kimberly [name] Kitchen [name] will talk with client in next 30 days to discuss anxiety and stress issues about managing her health conditions.   Interventions: . Talked with client about CCM program support with [name] and RNCM . Talked with client about current social work needs of client . Encouraged Kimberly [name] to talk with RNCM to discuss nursing needs of client . Talked  with client about relaxation techniques  to help client manage stress/anxiety symptoms.   Patient Self Care Activities:  . Takes medications as prescribed . Talks to PCP as needed about medical needs of client  . Attends medical appointments as scheduled  Plan: Client to call [name] to discuss social work needs of client Client to talk with RNCM about nursing needs of client [name] to call client in next 2 weeks to assess psychosocial needs of client  Initial goal documentation      Client said she has prescribed medications and is taking medications as prescribed. She said she uses [facility] transport services as needed to help her with transport needs to and from her medical appointments. She said she has some difficulty sleeping. She said she is eating adequately. She said she speaks via phone with her daughter at least one time weekly. [name] encouraged Kimberly [name] to talk with RNCM as needed to discuss nursing needs of client. She said she had not had any recent falls. She said she does use a walker to help her with ambulation.   Follow Up Plan: [name] to call client in next 3 weeks to assess the psychosocial needs of client at that time  Kimberly [name].Kimberly [name] MSW, [name] Licensed Clinical Social Worker [facility] Care Management [phone]

## 2019-01-11 NOTE — Patient Instructions (Signed)
Licensed Clinical Social Worker Visit Information  Goals we discussed today:  Goals    . Client verbalizes she wants to talk with someone about anxiety and stress about managing her health conditions (patient stated) (pt-stated)     Current Barriers:  . Transportation . Social Isolation . Mental Health Challenges  Clinical Social Work Clinical Goal(s):  [name] [name] will talk with client in next 30 days to discuss anxiety and stress issues about managing her health conditions.   Interventions: . Talked with client about CCM program support with [name] and RNCM . Talked with client about current social work needs of client  .  Encouraged client to talk with RNCM to discuss nursing needs of client . Talked  with client about relaxation techniques to help client manage stress/anxiety symptoms.   Patient Self Care Activities:  . Takes medications as prescribed . Talks to PCP as needed about medical needs of client  . Attends medical appointments as scheduled  Plan: Client to call [name] to discuss social work needs of client Client to talk with RNCM about nursing needs of client [name] to call client in next 3 weeks to assess psychosocial needs of client  Initial goal documentation       Materials Provided: No  Follow Up Plan:[name] to call client in next 3 weeks to assess the psychosocial needs of client at that time.    The patient verbalized understanding of instructions provided today and declined a print copy of patient instruction materials.   [name].Welcome Fults MSW, [name] Licensed Clinical Social Worker [facility] Care Management [phone]

## 2019-01-13 ENCOUNTER — Ambulatory Visit: Payer: Medicare Other | Admitting: *Deleted

## 2019-01-13 DIAGNOSIS — I10 Essential (primary) hypertension: Secondary | ICD-10-CM

## 2019-01-13 DIAGNOSIS — M1990 Unspecified osteoarthritis, unspecified site: Secondary | ICD-10-CM

## 2019-01-13 NOTE — Chronic Care Management (AMB) (Signed)
  Chronic Care Management   Care Coordination Note  01/13/2019 Name: Kimberly [name] MRN: [id number] DOB: 06-23-1926   [name] was referred to the [facility] team for help with chronic care management and coordination of care.   [name] spoke with Legrand Como "Scott" Forrest, [name] with the [facility] CCM Team regarding her psychosocial needs on 01/11/2019. It may be more beneficial to spread out our CCM contacts. Considering this, I will reschedule my telephone visit with [name] for next week.    Follow up plan: RNCM will reach out to patient over the next 10 days.  Patient has been provided with CCM contact information and has been encouraged to reach out with any questions or concerns.   [name] BSN, RN-BC Embedded Chronic Care Manager [facility] / [place] Management Direct Dial: [phone]

## 2019-01-19 ENCOUNTER — Ambulatory Visit: Payer: Medicare Other | Admitting: *Deleted

## 2019-01-19 DIAGNOSIS — I10 Essential (primary) hypertension: Secondary | ICD-10-CM

## 2019-01-19 DIAGNOSIS — F331 Major depressive disorder, recurrent, moderate: Secondary | ICD-10-CM

## 2019-01-19 NOTE — Chronic Care Management (AMB) (Signed)
  Chronic Care Management   Follow Up Attempt  01/19/2019 Name: Kimberly [name] MRN: [id number] DOB: 24-Dec-1926  I unsuccessfully reached out to Ms Handler by telephone today as a follow-up to our previous conversation last month. I have had multiple unsuccessful attempts to reach her but she has been in contact with Theadore Nan, [name] by telephone.   Follow up plan: A HIPPA compliant phone message was left for the patient providing contact information and requesting a return call.  The care management team will reach out to the patient again over the next 30 days.   Ms Broecker is scheduled for a f/u with [name] on 02/03/2019   [name] BSN, RN-BC [place] [facility] [place] Management Direct Dial: [phone]

## 2019-02-02 ENCOUNTER — Encounter: Payer: Self-pay | Admitting: Internal Medicine

## 2019-02-03 ENCOUNTER — Ambulatory Visit (INDEPENDENT_AMBULATORY_CARE_PROVIDER_SITE_OTHER): Payer: Medicare Other | Admitting: Licensed Clinical Social Worker

## 2019-02-03 DIAGNOSIS — F331 Major depressive disorder, recurrent, moderate: Secondary | ICD-10-CM | POA: Diagnosis not present

## 2019-02-03 DIAGNOSIS — F3178 Bipolar disorder, in full remission, most recent episode mixed: Secondary | ICD-10-CM | POA: Diagnosis not present

## 2019-02-03 DIAGNOSIS — K219 Gastro-esophageal reflux disease without esophagitis: Secondary | ICD-10-CM

## 2019-02-03 DIAGNOSIS — I4821 Permanent atrial fibrillation: Secondary | ICD-10-CM

## 2019-02-03 DIAGNOSIS — I10 Essential (primary) hypertension: Secondary | ICD-10-CM | POA: Diagnosis not present

## 2019-02-03 NOTE — Chronic Care Management (AMB) (Signed)
  Care Management Note   Kimberly [name] is a 85 y.o. year old female who is a primary care patient of [name], DO. The CM team was consulted for assistance with chronic disease management and care coordination.   I reached out to [name] by phone today.   Review of patient status, including review of consultants reports, relevant laboratory and other test results, and collaboration with appropriate care team members and the patient's provider was performed as part of comprehensive patient evaluation and provision of chronic care management services.   Social Determinants of Health: risk of social isolation;risk of tobacco use; risk of depression    Office Visit from 12/13/2018 in [place]  PHQ-9 Total Score  10     Medications    acetaminophen (TYLENOL) 325 MG tablet    carvedilol (COREG) 6.25 MG tablet    colchicine 0.6 MG tablet    doxycycline (VIBRA-TABS) 100 MG tablet    ELIQUIS 2.5 MG TABS tablet    furosemide (LASIX) 20 MG tablet    mirtazapine (REMERON) 7.5 MG tablet    Multiple Vitamins-Minerals (PRESERVISION AREDS 2) CAPS    omeprazole (PRILOSEC OTC) 20 MG tablet    Polyethyl Glycol-Propyl Glycol (SYSTANE ULTRA) 0.4-0.3 % SOLN    traZODone (DESYREL) 150 MG tablet    Goals    . Client verbalizes she wants to talk with someone about anxiety and stress about managing her health conditions (patient stated) (pt-stated)     Current Barriers:  . Transportation . Social Isolation . Mental Health Challenges  Clinical Social Work Clinical Goal(s):  [name] [name] will talk with client in next 30 days to discuss anxiety and stress issues about managing her health conditions.   Interventions: . Talked with client about CCM program support with [name] and RNCM . Talked with client about current social work needs of client . Talked with client about social support network of client . Talked  with client about relaxation techniques to help client  manage stress/anxiety symptoms. . Talked with client about [facility] transportation support services   Patient Self Care Activities:  . Takes medications as prescribed . Talks to PCP as needed about medical needs of client  . Attends medical appointments as scheduled  Plan: Client to call [name] to discuss social work needs of client Client to talk with RNCM about nursing needs of client [name] to call client in next 3 weeks to assess psychosocial needs of client  Initial goal documentation       Follow Up Plan: [name] to call client in next 3 weeks to assess the psychosocial needs of client at that time  [name].Daxon Kyne MSW, [name] Licensed Clinical Social Worker [facility] Care Management [phone]

## 2019-02-03 NOTE — Patient Instructions (Signed)
Licensed Clinical Social Worker Visit Information  Goals we discussed today:  Goals    . Client verbalizes she wants to talk with someone about anxiety and stress about managing her health conditions (patient stated) (pt-stated)     Current Barriers:  . Transportation . Social Isolation . Mental Health Challenges  Clinical Social Work Clinical Goal(s):  [name] [name] will talk with client in next 30 days to discuss anxiety and stress issues about managing her health conditions.   Interventions: . Talked with client about CCM program support with [name] and RNCM . Talked with client about current social work needs of client . Talked with client about social support network of client . Talked  with client about relaxation techniques to help client manage stress/anxiety symptoms. . Talked with client about [facility] support services   Patient Self Care Activities:  . Takes medications as prescribed . Talks to PCP as needed about medical needs of client  . Attends medical appointments as scheduled  Plan: Client to call [name] to discuss social work needs of client Client to talk with RNCM about nursing needs of client [name] to call client in next 3 weeks to assess psychosocial needs of client  Initial goal documentation       Materials Provided: No  Follow Up Plan: [name] to call client in next 3 weeks to assess the psychosocial needs of client at that time    The patient verbalized understanding of instructions provided today and declined a print copy of patient instruction materials.   [name].Osiris Charles MSW, [name] Licensed Clinical Social Worker [facility] Care Management [phone]

## 2019-02-16 ENCOUNTER — Telehealth: Payer: Medicare Other

## 2019-02-17 ENCOUNTER — Ambulatory Visit: Payer: Medicare Other | Admitting: *Deleted

## 2019-02-17 DIAGNOSIS — K279 Peptic ulcer, site unspecified, unspecified as acute or chronic, without hemorrhage or perforation: Secondary | ICD-10-CM

## 2019-02-17 DIAGNOSIS — G4700 Insomnia, unspecified: Secondary | ICD-10-CM

## 2019-02-17 DIAGNOSIS — I4821 Permanent atrial fibrillation: Secondary | ICD-10-CM

## 2019-02-17 NOTE — Chronic Care Management (AMB) (Signed)
Chronic Care Management   Follow Up Note   02/17/2019 Name: Kimberly [name] MRN: [id number] DOB: 08/09/1926  Referred by: [name], DO Reason for referral : Chronic Care Management (CCM Follow up)   Kimberly [name] is a 85 y.o. year old female who is a primary care patient of [name], DO. The CCM team was consulted for assistance with chronic disease management and care coordination needs.    Review of patient status, including review of consultants reports, relevant laboratory and other test results, and collaboration with appropriate care team members and the patient's provider was performed as part of comprehensive patient evaluation and provision of chronic care management services.    SDOH (Social Determinants of Health) screening performed today: Depression   Social Connections Physical Activity. See Care Plan for related entries.   Subjective:  I spoke with Ms Kimberly [name] today by telephone. She reports that her lower leg wound is almost completely healed. There are a few scabs remaining. She is taking anything for GERD at present. We discussed omeprazole during our last visit but she is managing with diet. She continjues to have difficulty sleeping but is not taking anything to treat insomnia because she hasn't found anything that helps. Medications were reviewed and updated.   Objective: Outpatient Encounter Medications as of 02/17/2019  Medication Sig Note  . acetaminophen (TYLENOL) 325 MG tablet Take 2 tablets (650 mg total) by mouth every 6 (six) hours as needed for mild pain, fever or headache (or Fever >/= 101). Takes as needed  . carvedilol (COREG) 6.25 MG tablet Take 1 tablet (6.25 mg total) by mouth 2 (two) times daily with a meal.   . ELIQUIS 2.5 MG TABS tablet TAKE 1 TABLET BY MOUTH  TWICE A DAY   . furosemide (LASIX) 20 MG tablet Take 1 tablet (20 mg total) by mouth daily.   . colchicine 0.6 MG tablet Take 1 tablet (0.6 mg total) by mouth daily. (Patient  not taking: Reported on 02/17/2019) Takes as needed  . mirtazapine (REMERON) 7.5 MG tablet Take 1 tablet (7.5 mg total) by mouth at bedtime. (Patient not taking: Reported on 02/17/2019) Not taking-doesn't think that it helps  . Multiple Vitamins-Minerals (PRESERVISION AREDS 2) CAPS Take 1 capsule by mouth 2 (two) times daily.    [name] omeprazole (PRILOSEC OTC) 20 MG tablet Take 1 tablet (20 mg total) by mouth daily. (Patient not taking: Reported on 02/17/2019) Not taking-managing with diet  . Polyethyl Glycol-Propyl Glycol (SYSTANE ULTRA) 0.4-0.3 % SOLN Place 1-2 drops into both eyes 2 (two) times daily as needed (dry eyes).    . traZODone (DESYREL) 150 MG tablet  Take 1 tablet (150 mg total) by mouth at bedtime. (Patient not taking: Reported on 02/17/2019) Not Taking-doesn't feel that it helps  . [DISCONTINUED] doxycycline (VIBRA-TABS) 100 MG tablet Take 1 tablet (100 mg total) by mouth 2 (two) times daily. 1 po bid       Goals Addressed            This Visit's Progress     Patient Stated   . Sleep Disturbance (pt-stated)       "I still don't sleep well but nothing helps so I've just gotten use to it."  Current Barriers:  [name] Knowledge Deficits related to effective sleep improvement strategies . Lacks caregiver support.   Nurse Case Manager Clinical Goal(s):  [name] Over the next 30 days, patient will verbalize understanding of plan for insomnia  Interventions:  . Evaluation  of current treatment plan related to insomnia and patient's adherence to plan as established by provider. . Advised patient to take medication as directed and practice good sleep hygiene . Reviewed medications with patient and discussed remeron and trazadone . Discussed plans with patient for ongoing care management follow up and provided patient with direct contact information for care management team  Patient Self Care Activities:  . Performs ADL's independently   Initial goal documentation     . Work with RN  CCM regarding [place] services (pt-stated)       Current Barriers:  . Chronic Disease Management support, education, and care coordination needs related to Atrial Fibrillation, CHF, HTN, and Bipolar Disorder  Clinical Goal(s) related to Atrial Fibrillation, CHF, HTN, and Bipolar Disorder:  Over the next 60 days, patient will:  . Work with the care management team to address educational, disease management, and care coordination needs  . Begin self health monitoring activities as directed today . Call provider office for new or worsened signs and symptoms . Call care management team with questions or concerns . Verbalize basic understanding of patient centered plan of care established today  Interventions related to Atrial Fibrillation, CHF, HTN, and Bipolar Disorder:  . Evaluation of current treatment plans and patient's adherence to plan as established by provider . Assessed patient understanding of disease states . Assessed patient's education and care coordination needs . Provided basic disease specific education to patient  . Collaborated with appropriate clinical care team members regarding patient needs  Patient Self Care Activities related to Atrial Fibrillation, CHF, HTN, and Bipolar Disorder:  . Patient is unable to independently self-manage chronic health conditions  Initial goal documentation         Follow-up Plan The care management team will reach out to the patient again over the next 30 days.   Patient provided with CCM contact information and will reach out as needed.  [name], BSN, RN-BC Embedded Chronic Care Manager [facility] / [place] Management Direct Dial: [phone]

## 2019-02-17 NOTE — Patient Instructions (Signed)
Visit Information  Goals Addressed            This Visit's Progress     Patient Stated    Sleep Disturbance (pt-stated)       "I still don't sleep well but nothing helps so I've just gotten use to it."  Current Barriers:   Knowledge Deficits related to effective sleep improvement strategies  Lacks caregiver support.   Nurse Case Manager Clinical Goal(s):   Over the next 30 days, patient will verbalize understanding of plan for insomnia  Interventions:   Evaluation of current treatment plan related to insomnia and patient's adherence to plan as established by provider.  Advised patient to take medication as directed and practice good sleep hygiene  Reviewed medications with patient and discussed remeron and trazadone  Discussed plans with patient for ongoing care management follow up and provided patient with direct contact information for care management team  Patient Self Care Activities:   Performs ADL's independently   Initial goal documentation      Work with RN CCM regarding CCM nursing services (pt-stated)       Current Barriers:   Chronic Disease Management support, education, and care coordination needs related to Atrial Fibrillation, CHF, HTN, and Bipolar Disorder  Clinical Goal(s) related to Atrial Fibrillation, CHF, HTN, and Bipolar Disorder:  Over the next 60 days, patient will:   Work with the care management team to address educational, disease management, and care coordination needs   Begin self health monitoring activities as directed today  Call provider office for new or worsened signs and symptoms  Call care management team with questions or concerns  Verbalize basic understanding of patient centered plan of care established today  Interventions related to Atrial Fibrillation, CHF, HTN, and Bipolar Disorder:   Evaluation of current treatment plans and patient's adherence to plan as established by provider  Assessed patient  understanding of disease states  Assessed patient's education and care coordination needs  Provided basic disease specific education to patient   Collaborated with appropriate clinical care team members regarding patient needs  Patient Self Care Activities related to Atrial Fibrillation, CHF, HTN, and Bipolar Disorder:   Patient is unable to independently self-manage chronic health conditions  Initial goal documentation          Quick Sleep Tips Follow these tips to establish healthy sleep habits:  Get up at the same time every day, even on weekends or during vacations.   Set a bedtime that is early enough for you to get at least 7 hours of sleep.   Dont go to bed unless you are sleepy.   If you dont fall asleep after 20 minutes, get out of bed.   Establish a relaxing bedtime routine.   Use your bed only for sleep and sex.   Make your bedroom quiet and relaxing.   Keep the room at a comfortable, cool temperature.   Limit exposure to bright light in the evenings.   Turn off electronic devices at least 30 minutes before bedtime.   Dont eat a large meal before bedtime. If you are hungry at night, eat a light, healthy snack.   Exercise regularly and maintain a healthy diet.   Avoid consuming caffeine in the late afternoon or evening.   Avoid consuming alcohol before bedtime.   Reduce your fluid intake before bedtime.  (Source: http://sleepeducation.org/essentials-in-sleep/healthy-sleep-habits)  The patient verbalized understanding of instructions provided today and declined a print copy of patient instruction materials.  Follow-up Plan The care management team will reach out to the patient again over the next 30 days.   Patient provided with CCM contact information and will reach out as needed.  Kimberly [name], BSN, RN-BC Embedded Chronic Care Manager [facility] [place] [facility] Direct Dial: [phone]

## 2019-02-24 ENCOUNTER — Ambulatory Visit: Payer: Self-pay | Admitting: Licensed Clinical Social Worker

## 2019-02-24 DIAGNOSIS — I10 Essential (primary) hypertension: Secondary | ICD-10-CM

## 2019-02-24 DIAGNOSIS — K219 Gastro-esophageal reflux disease without esophagitis: Secondary | ICD-10-CM

## 2019-02-24 DIAGNOSIS — F331 Major depressive disorder, recurrent, moderate: Secondary | ICD-10-CM

## 2019-02-24 DIAGNOSIS — F3178 Bipolar disorder, in full remission, most recent episode mixed: Secondary | ICD-10-CM

## 2019-02-24 DIAGNOSIS — I4821 Permanent atrial fibrillation: Secondary | ICD-10-CM

## 2019-02-24 DIAGNOSIS — K279 Peptic ulcer, site unspecified, unspecified as acute or chronic, without hemorrhage or perforation: Secondary | ICD-10-CM

## 2019-02-24 NOTE — Chronic Care Management (AMB) (Signed)
  Care Management Note   Kimberly [name] is a 85 y.o. year old female who is a primary care patient of Kimberly Norlander, DO. The CM team was consulted for assistance with chronic disease management and care coordination.   I reached out to Kimberly [name] by phone today.   Review of patient status, including review of consultants reports, relevant laboratory and other test results, and collaboration with appropriate care team members and the patient's provider was performed as part of comprehensive patient evaluation and provision of chronic care management services.   Social Determinants of Health: risk of social isolation; risk of depression ; risk of physical inactivity    Office Visit from 12/13/2018 in [place]  PHQ-9 Total Score  10     Medications    acetaminophen (TYLENOL) 325 MG tablet    carvedilol (COREG) 6.25 MG tablet    colchicine 0.6 MG tablet    ELIQUIS 2.5 MG TABS tablet    furosemide (LASIX) 20 MG tablet    mirtazapine (REMERON) 7.5 MG tablet    Multiple Vitamins-Minerals (PRESERVISION AREDS 2) CAPS    omeprazole (PRILOSEC OTC) 20 MG tablet    Polyethyl Glycol-Propyl Glycol (SYSTANE ULTRA) 0.4-0.3 % SOLN    traZODone (DESYREL) 150 MG tablet     Goals    . Client verbalizes she wants to talk with someone about anxiety and stress about managing her health conditions (patient stated) (pt-stated)     Current Barriers:  . Transportation . Social Isolation . Mental Health Challenges  Clinical Social Work Clinical Goal(s):  Kimberly [name] Kitchen [name] will talk with client in next 30 days to discuss anxiety and stress issues about managing her health conditions.   Interventions: . Talked previously with client about CCM program support with [name] and RNCM . Talked with client previously about current social work needs of client . Previously talked with client about social support network of client . Previously talked  with client about relaxation techniques to  help client manage stress/anxiety symptoms. (watch TV, talk via phone with daughter) . Talked with client about her ambulation needs (she uses a walker to help her ambulate) . Talked with client about pain issues of client  Patient Self Care Activities:  . Takes medications as prescribed . Talks to PCP as needed about medical needs of client  . Attends medical appointments as scheduled  Plan: Client to call [name] to discuss social work needs of client Client to talk with RNCM about nursing needs of client [name] to call client in next 3 weeks to assess psychosocial needs of client  Initial goal documentation        Follow Up Plan: [name] to call client in the next 3 weeks to assess the psychosocial needs of client at that time  Kimberly [name].Kimberly [name] MSW, [name] Licensed Clinical Social Worker [facility] Care Management [phone]

## 2019-02-24 NOTE — Patient Instructions (Signed)
Licensed Clinical Social Worker Visit Information  Goals we discussed today:  Goals    . Client verbalizes she wants to talk with someone about anxiety and stress about managing her health conditions (patient stated) (pt-stated)     Current Barriers:  . Transportation . Social Isolation . Mental Health Challenges  Clinical Social Work Clinical Goal(s):  [name] [name] will talk with client in next 30 days to discuss anxiety and stress issues about managing her health conditions.   Interventions: . Talked with client about CCM program support with [name] and RNCM . Talked previously with client about current social work needs of client . Talked with client about social support network of client (daughter) . Talked  with client about relaxation techniques to help client manage stress/anxiety symptoms.(likes to watch TV, likes to talk via phone with her daughter)   Patient Self Care Activities:  . Takes medications as prescribed . Talks to PCP as needed about medical needs of client  . Attends medical appointments as scheduled  Plan: Client to call [name] to discuss social work needs of client Client to talk with RNCM about nursing needs of client [name] to call client in next 3 weeks to assess psychosocial needs of client  Initial goal documentation         Materials Provided: No  Follow Up Plan: [name] to call client in next 3 weeks to assess the psychosocial needs of client at that time  The patient verbalized understanding of instructions provided today and declined a print copy of patient instruction materials.   [name].Kate Larock MSW, [name] Licensed Clinical Social Worker [facility] Care Management [phone]

## 2019-03-17 ENCOUNTER — Ambulatory Visit (INDEPENDENT_AMBULATORY_CARE_PROVIDER_SITE_OTHER): Payer: Medicare Other | Admitting: Licensed Clinical Social Worker

## 2019-03-17 DIAGNOSIS — F3178 Bipolar disorder, in full remission, most recent episode mixed: Secondary | ICD-10-CM

## 2019-03-17 DIAGNOSIS — F331 Major depressive disorder, recurrent, moderate: Secondary | ICD-10-CM

## 2019-03-17 DIAGNOSIS — I4821 Permanent atrial fibrillation: Secondary | ICD-10-CM

## 2019-03-17 DIAGNOSIS — I10 Essential (primary) hypertension: Secondary | ICD-10-CM | POA: Diagnosis not present

## 2019-03-17 DIAGNOSIS — K219 Gastro-esophageal reflux disease without esophagitis: Secondary | ICD-10-CM

## 2019-03-17 DIAGNOSIS — K279 Peptic ulcer, site unspecified, unspecified as acute or chronic, without hemorrhage or perforation: Secondary | ICD-10-CM

## 2019-03-17 NOTE — Chronic Care Management (AMB) (Signed)
  Care Management Note   Kimberly [name] is a 85 y.o. year old female who is a primary care patient of Kimberly Norlander, DO. The CM team was consulted for assistance with chronic disease management and care coordination.   I reached out to Kimberly [name] by phone today.    Review of patient status, including review of consultants reports, relevant laboratory and other test results, and collaboration with appropriate care team members and the patient's provider was performed as part of comprehensive patient evaluation and provision of chronic care management services.   Social determinants of health: risk of social isolation; risk of tobacco use; risk of physical inactivity    Office Visit from 12/13/2018 in [name]  PHQ-9 Total Score  10     Medications   New medications from outside sources are available for reconciliation   acetaminophen (TYLENOL) 325 MG tablet    carvedilol (COREG) 6.25 MG tablet    colchicine 0.6 MG tablet    ELIQUIS 2.5 MG TABS tablet    furosemide (LASIX) 20 MG tablet    mirtazapine (REMERON) 7.5 MG tablet    Multiple Vitamins-Minerals (PRESERVISION AREDS 2) CAPS    omeprazole (PRILOSEC OTC) 20 MG tablet    Polyethyl Glycol-Propyl Glycol (SYSTANE ULTRA) 0.4-0.3 % SOLN    traZODone (DESYREL) 150 MG tablet     Goals    . Client verbalizes she wants to talk with someone about anxiety and stress about managing her health conditions (patient stated) (pt-stated)     Current Barriers:  . Transportation . Social Isolation . Mental Health Challenges  Clinical Social Work Clinical Goal(s):  Kimberly [name] Kitchen [name] will talk with client in next 30 days to discuss anxiety and stress issues about managing her health conditions.   Interventions: . Talked with client about CCM program support with [name] and RNCM . Talked with client about current social work needs of client . Talked with client about social support network of client . Talked  with client  about relaxation techniques to help client manage stress/anxiety symptoms. . Talked with client about transport needs of client  . Talked with client about ambulation challenges of client (she uses a walker to ambulate)  Patient Self Care Activities:  . Takes medications as prescribed . Talks to PCP as needed about medical needs of client  . Attends medical appointments as scheduled  Plan: Client to call [name] to discuss social work needs of client Client to talk with RNCM about nursing needs of client [name] to call client in next  3 weeks to assess psychosocial needs of client  Initial goal documentation       Follow Up Plan: [name] to call client in next 3 weeks to assess the psychosocial needs of client at that time  Kimberly [name] MSW, [name] Licensed Clinical Social Worker [name] [phone]

## 2019-03-17 NOTE — Patient Instructions (Addendum)
Licensed Clinical Social Worker Visit Information  Goals we discussed today:  Goals   . Client verbalizes she wants to talk with someone about anxiety and stress about managing her health conditions (patient stated) (pt-stated)     Current Barriers:  . Transportation . Social Isolation . Mental Health Challenges  Clinical Social Work Clinical Goal(s):  [name] [name] will talk with client in next 30 days to discuss anxiety and stress issues about managing her health conditions.   Interventions: . Talked with client about CCM program support with [name] and RNCM . Talked with client about current social work needs of client . Talked with client about social support network of client . Talked  with client about relaxation techniques to help client manage stress/anxiety symptoms.   Talked with client about transport needs of client   Talked with client about ambulation challenges of client (she uses a walker to ambulate)  Patient Self Care Activities:  . Takes medications as prescribed . Talks to PCP as needed about medical needs of client  . Attends medical appointments as scheduled  Plan: Client to call [name] to discuss social work needs of client Client to talk with RNCM about nursing needs of client [name] to call client in next 3 weeks to assess psychosocial needs of client  Initial goal documentation        Materials Provided: No  Follow Up Plan: [name] to call client in next 3 weeks to assess the psychosocial needs of client at that time  The patient verbalized understanding of instructions provided today and declined a print copy of patient instruction materials.   [name].Gabriela Irigoyen MSW, [name] Licensed Clinical Social Worker [facility] [phone]

## 2019-03-25 ENCOUNTER — Telehealth: Payer: Self-pay | Admitting: Family Medicine

## 2019-03-25 NOTE — Telephone Encounter (Signed)
Spoke with [facility] nurse:  She is aware that per our notes, pts meds have trazodone 150 nightly listed.  There is also a note that she may not be taking it.   [facility] nurse will call and discuss with pt . She is also aware NTBS in office = has not been seen with PCP since June 2020.

## 2019-04-11 ENCOUNTER — Ambulatory Visit (INDEPENDENT_AMBULATORY_CARE_PROVIDER_SITE_OTHER): Payer: Medicare Other | Admitting: Licensed Clinical Social Worker

## 2019-04-11 DIAGNOSIS — F3178 Bipolar disorder, in full remission, most recent episode mixed: Secondary | ICD-10-CM | POA: Diagnosis not present

## 2019-04-11 DIAGNOSIS — F331 Major depressive disorder, recurrent, moderate: Secondary | ICD-10-CM | POA: Diagnosis not present

## 2019-04-11 DIAGNOSIS — I4821 Permanent atrial fibrillation: Secondary | ICD-10-CM | POA: Diagnosis not present

## 2019-04-11 DIAGNOSIS — K279 Peptic ulcer, site unspecified, unspecified as acute or chronic, without hemorrhage or perforation: Secondary | ICD-10-CM

## 2019-04-11 DIAGNOSIS — I10 Essential (primary) hypertension: Secondary | ICD-10-CM | POA: Diagnosis not present

## 2019-04-11 DIAGNOSIS — K219 Gastro-esophageal reflux disease without esophagitis: Secondary | ICD-10-CM

## 2019-04-11 NOTE — Patient Instructions (Addendum)
Licensed Clinical Social Worker Visit Information  Goals we discussed today:  Goals    . Client verbalizes she wants to talk with someone about anxiety and stress about managing her health conditions (patient stated) (pt-stated)     Current Barriers:   Mental Health Challenges of client with chronic diagnoses of HTN, Depression, Bipolar Affective Disorder and CKD  Social isolation Scientist, research (medical)  Clinical Social Work Clinical Goal(s):  [name] [name] will talk with client in next 30 days to discuss anxiety and stress issues about managing her health conditions.   Interventions:  Talked with client about CCM program support with [name] and RNCM  Talked with client about current social work needs of client  Talked with client about social support network of client (daughter, and neighbor)  Talked  with client about relaxation techniques to help client manage stress/anxiety symptoms.  Talked with client about pain issues of client   Patient Self Care Activities:  . Takes medications as prescribed . Talks to PCP as needed about medical needs of client  . Attends medical appointments as scheduled  Plan: Client to call [name] to discuss social work needs of client Client to talk with RNCM about nursing needs of client [name] to call client in next 3 weeks to assess psychosocial needs of client  Initial goal documentation         Materials Provided: No  Follow Up Plan:  [name] to call client in next 3 weeks to assess the psychosocial needs of client at that time  The patient verbalized understanding of instructions provided today and declined a print copy of patient instruction materials.   [name].Deepa Barthel MSW, [name] Licensed Clinical Social Worker [facility] [phone]

## 2019-04-11 NOTE — Chronic Care Management (AMB) (Signed)
  Care Management Note   Kimberly [name] is a 85 y.o. year old female who is a primary care patient of [name], DO. The CM team was consulted for assistance with chronic disease management and care coordination.   I reached out to [name] by phone today.   Review of patient status, including review of consultants reports, relevant laboratory and other test results, and collaboration with appropriate care team members and the patient's provider was performed as part of comprehensive patient evaluation and provision of chronic care management services.   Social determinants of health: risk of social isolation; risk of tobacco use; risk of physical inactivity    Office Visit from 12/13/2018 in [place]  PHQ-9 Total Score  10     Medications   New medications from outside sources are available for reconciliation   acetaminophen (TYLENOL) 325 MG tablet    carvedilol (COREG) 6.25 MG tablet    colchicine 0.6 MG tablet    ELIQUIS 2.5 MG TABS tablet    furosemide (LASIX) 20 MG tablet    mirtazapine (REMERON) 7.5 MG tablet    Multiple Vitamins-Minerals (PRESERVISION AREDS 2) CAPS    omeprazole (PRILOSEC OTC) 20 MG tablet    Polyethyl Glycol-Propyl Glycol (SYSTANE ULTRA) 0.4-0.3 % SOLN    traZODone (DESYREL) 150 MG tablet    Goals    . Client verbalizes she wants to talk with someone about anxiety and stress about managing her health conditions (patient stated) (pt-stated)     Current Barriers:  [name] Mental Health Challenges of client with chronic diagnoses of HTN, Depression, Bipolar Affective Disorder and CKD . Social isolation challenges . Transportation challenges  Clinical Social Work Clinical Goal(s):  [name] [name] will talk with client in next 30 days to discuss anxiety and stress issues about managing her health conditions.   Interventions: . Talked with client about CCM program support with [name] and RNCM . Talked with client about current social  work needs of client . Talked with client about social support network of client (daughter, and neighbor) . Talked  with client about relaxation techniques to help client manage stress/anxiety symptoms. . Talked with client about pain issues of client   Patient Self Care Activities:  . Takes medications as prescribed . Talks to PCP as needed about medical needs of client  . Attends medical appointments as scheduled  Plan: Client to call [name] to discuss social work needs of client Client to talk with RNCM about nursing needs of client [name] to call client in next 3 weeks to assess psychosocial needs of client  Initial goal documentation           Follow Up Plan:  [name] to call client in next 3 weeks to assess the psychosocial needs of client at that time  [name].Finnlee Guarnieri MSW, [name] Licensed Clinical Social Worker [facility] [phone]

## 2019-04-12 ENCOUNTER — Other Ambulatory Visit: Payer: Self-pay | Admitting: Cardiovascular Disease

## 2019-04-12 DIAGNOSIS — Z950 Presence of cardiac pacemaker: Secondary | ICD-10-CM

## 2019-04-12 DIAGNOSIS — I5043 Acute on chronic combined systolic (congestive) and diastolic (congestive) heart failure: Secondary | ICD-10-CM

## 2019-04-27 ENCOUNTER — Ambulatory Visit: Payer: Self-pay | Admitting: Licensed Clinical Social Worker

## 2019-04-27 DIAGNOSIS — I10 Essential (primary) hypertension: Secondary | ICD-10-CM

## 2019-04-27 DIAGNOSIS — F331 Major depressive disorder, recurrent, moderate: Secondary | ICD-10-CM

## 2019-04-27 DIAGNOSIS — M797 Fibromyalgia: Secondary | ICD-10-CM

## 2019-04-27 NOTE — Chronic Care Management (AMB) (Signed)
  Care Management Note   Kimberly [name] is a 85 y.o. year old female who is a primary care patient of [name], DO. The CM team was consulted for assistance with chronic disease management and care coordination.   I reached out to [name] by phone today.   Review of patient status, including review of consultants reports, relevant laboratory and other test results, and collaboration with appropriate care team members and the patient's provider was performed as part of comprehensive patient evaluation and provision of chronic care management services.   Social determinants of health: risk of social isolation; risk of tobacco use; risk of depression; risk of physical inactivity    Office Visit from 12/13/2018 in [place]  PHQ-9 Total Score  10     Medications   (very important)  New medications from outside sources are available for reconciliation  acetaminophen (TYLENOL) 325 MG tablet carvedilol (COREG) 6.25 MG tablet colchicine 0.6 MG tablet ELIQUIS 2.5 MG TABS tablet furosemide (LASIX) 20 MG tablet mirtazapine (REMERON) 7.5 MG tablet Multiple Vitamins-Minerals (PRESERVISION AREDS 2) CAPS omeprazole (PRILOSEC OTC) 20 MG tablet Polyethyl Glycol-Propyl Glycol (SYSTANE ULTRA) 0.4-0.3 % SOLN traZODone (DESYREL) 150 MG tablet   Goals Addressed            This Visit's Progress   . Client verbalizes she wants to talk with someone about anxiety and stress about managing her health conditions (patient stated) (pt-stated)       Current Barriers:  . Transportation . Social Isolation . Mental Health Challenges in patient with Chronic Diagnoses of Hx Bipolar Disorder, Fibromyalgia, HTN,  Depression, and CKD  Clinical Social Work Clinical Goal(s):  [name] [name] will talk with client in next 30 days to discuss anxiety and stress issues about managing her health conditions.   Interventions:  . Previously talked with client about current social work  needs of client . Talked with client about social support network of client . Previously talked  with client about relaxation techniques to help client manage stress/anxiety symptoms.  . Talked with client about ADTS support ([name] gave client phone number for ADTS) . Talked with client about pain issus of client . Talked with client about ambulation needs of client (uses walker to ambulate)  Patient Self Care Activities:  . Takes medications as prescribed . Talks to PCP as needed about medical needs of client  . Attends medical appointments as scheduled  Plan: Client to call [name] to discuss social work needs of client Client to talk with RNCM about nursing needs of client [name] to call client in next 4 weeks to assess psychosocial needs of client  Initial goal documentation        Follow Up Plan: [name] to call client in next 4 weeks to assess the psychosocial needs of client at that time  [name].Andrina Locken MSW, [name] Licensed Clinical Social Worker [facility] [phone]

## 2019-04-27 NOTE — Patient Instructions (Addendum)
Licensed Clinical Social Worker Visit Information  Goals we discussed today:  Goals Addressed            This Visit's Progress   . Client verbalizes she wants to talk with someone about anxiety and stress about managing her health conditions (patient stated) (pt-stated)       Current Barriers:  . Transportation . Social Isolation . Mental Health Challenges in patient with Chronic Diagnoses of Hx Bipolar Disorder, Fibromyalgia, HTN,  Depression, and CKD  Clinical Social Work Clinical Goal(s):  [name] [name] will talk with client in next 30 days to discuss anxiety and stress issues about managing her health conditions.   Interventions:  Previously talked with client about current social work needs of client  Talked with client about social support network of client  Previously talked  with client about relaxation techniques to help client manage stress/anxiety symptoms.   Talked with client about ADTS support ([name] gave client phone number for ADTS)  Talked with client about pain issus of client  Talked with client about ambulation needs of client (uses walker to ambulate)  Patient Self Care Activities:  . Takes medications as prescribed . Talks to PCP as needed about medical needs of client  . Attends medical appointments as scheduled  Plan: Client to call [name] to discuss social work needs of client Client to talk with RNCM about nursing needs of client [name] to call client in next 4 weeks to assess psychosocial needs of client  Initial goal documentation         Materials Provided: No  Follow Up Plan:  [name] to call client in next 4 weeks to assess the psychosocial needs of client at that time  The patient verbalized understanding of instructions provided today and declined a print copy of patient instruction materials.   [name].Ikhlas Albo MSW, [name] Licensed Clinical Social Worker [facility] [phone]

## 2019-05-10 ENCOUNTER — Ambulatory Visit: Payer: Medicare Other | Admitting: *Deleted

## 2019-05-10 NOTE — Chronic Care Management (AMB) (Signed)
  Chronic Care Management   Outreach Note  05/10/2019 Name: Kimberly [name] MRN: [id number] DOB: [birth year]  Referred by: [name], DO Reason for referral : Chronic Care Management (follow up)   An unsuccessful telephone follow-up outreach was attempted today. The patient was referred to the case management team by for assistance with care management and care coordination.   Follow Up Plan: A HIPPA compliant phone message was left for the patient providing contact information and requesting a return call.  The care management team will reach out to the patient again over the next 30 days.   [name], BSN, RN-BC Embedded Chronic Care Manager [facility] [place] [facility] Direct Dial: [phone]

## 2019-05-20 ENCOUNTER — Telehealth: Payer: Self-pay | Admitting: Cardiovascular Disease

## 2019-05-20 NOTE — Telephone Encounter (Signed)
Pt c/o Shortness Of Breath: STAT if SOB developed within the last 24 hours or pt is noticeably SOB on the phone  1. Are you currently SOB (can you hear that pt is SOB on the phone)? Yes, a little bit   2. How long have you been experiencing SOB? A few days  3. Are you SOB when sitting or when up moving around? Both   4. Are you currently experiencing any other symptoms? No  Patient is calling stating she has been experiencing some SOB. She states there isn't any other symptoms but it's been going on for the past few days and hasn't gotten any better. She also states Dr. Sallyanne Kuster stated she may have to have her Pacemaker replaced this year and she is wanting to know the status of that. Please advise.

## 2019-05-20 NOTE — Telephone Encounter (Signed)
Kimberly [name] will not be there on Monday either.

## 2019-05-20 NOTE — Telephone Encounter (Signed)
Spoke to patient she stated she has been sob for the past 2 to 3 days.Weight is stable.She has swelling in both lower legs but no worse than normal.Stated Dr.Croitoru said this is the month her battery would need to be changed.Appointment was offered for today but she cannot come she has to call transportation.Appointment scheduled with Jory Sims DNP Mon 1/18 at 9:45 am.I will send message to device clinic for remote check.

## 2019-05-20 NOTE — Telephone Encounter (Signed)
[employer]

## 2019-05-20 NOTE — Telephone Encounter (Signed)
The [employer] rep has been contacted and will be there to do a download for the appointment on 1/18.

## 2019-05-20 NOTE — Telephone Encounter (Signed)
Spoke to patient Dr.Croitoru advised PM to be checked Mon 1/18 at appointment.[employer] Rep will be notified.Advised to go to ED if sob becomes worse.

## 2019-05-20 NOTE — Telephone Encounter (Signed)
Please contact the patient and advise her to seek immediate medical attention in the ED if SOB continues or worsens.

## 2019-05-20 NOTE — Telephone Encounter (Signed)
Since I will not be in the office on Monday, I do not know that anybody will be able to check her device.  I do not think she has been doing remote downloads.  If she does, Monday the 18th, we should make arrangements for the pacemaker rep to be there.

## 2019-05-21 NOTE — Progress Notes (Deleted)
Cardiology Office Note   Date:  05/21/2019   ID:  Kimberly [name], DOB 1926/06/21, MRN [id number]  PCP:  Kimberly Norlander, DO  Cardiologist: Dr. Sallyanne Kuster No chief complaint on file.    History of Present Illness: Kimberly [name] is a 86 y.o. female who presents for ongoing assessment and management of atrial fibrillation, AV node ablation secondary to complete heart block, status post CRT-P device ([employer]), chronic diastolic heart failure, frailty with falls, nonischemic cardiomyopathy.  Other history includes chronic kidney disease stage III, antral ulcer, erosive esophagitis.  She called her office concerning of worsening shortness of breath and fatigue on 05/20/2019.  She is due for a pacemaker interrogation and [employer] representative is due to see her today.  Her weight has been stable but her dyspnea had worsened over the last 3 days.  She will also need a walking O2 saturation for evaluation of need for oxygen.   Past Medical History:  Diagnosis Date  . Acute blood loss anemia 10/11/2012  . Acute diverticulitis 08/24/2013  . Acute on chronic combined systolic and diastolic CHF, NYHA class 4 ([place]) 11/15/2013  . Antral ulcer 10/11/2012  . Arrhythmia    atrial fibb  . Atrial fibrillation ([place])   . Cardiomyopathy, nonischemic ([place])   . Chronic anticoagulation 10/12/2012  . CKD (chronic kidney disease) stage 3, GFR 30-59 ml/min 10/12/2012  . Depression   . Erosive esophagitis 10/11/2012  . Fibromyalgia   . Glaucoma   . H/O echocardiogram 2007   EF 40-45%,         . Hypertension   . Osteoarthritis   . Pacemaker    Last saw cards 07/2013  . Scoliosis     Past Surgical History:  Procedure Laterality Date  . ABDOMINAL HYSTERECTOMY    . APPENDECTOMY    . BACK SURGERY    . BIOPSY  03/03/2017   Procedure: BIOPSY;  Surgeon: Danie Binder, MD;  [facility];  Service: Endoscopy;;  gastric  . BREAST SURGERY    . CARDIAC CATHETERIZATION  12/08/2005   LAD AND LEFT MAIN WITH  NO HIGH-GRADE STENOSIS. MILD DISEASE IN THE CX AND LAD SYSTEM. SEVERE LV DYSFUNCTION WITH DILATION OF THE LV. EF 15-20%. LV END-DIASTOLIC PRESSURE IS 90. +1 MR.  . CHOLECYSTECTOMY    . COLONOSCOPY N/A 03/03/2017   Procedure: COLONOSCOPY;  Surgeon: Danie Binder, MD;  [facility];  Service: Endoscopy;  Laterality: N/A;  . CYSTOSCOPY N/A 02/24/2013   Procedure: CYSTOSCOPY WITH URETHRAL DILITATION;  Surgeon: Marissa Nestle, MD;  Location: [facility];  Service: Urology;  Laterality: N/A;  . DOPPLER ECHOCARDIOGRAPHY N/A 05/30/2010   LV SIZE IS NORMAL. LV SYSTOLIC FUNCTION IS LOW NORMAL. EF=50-55%. MILD INFERIOR HYPOKINESIS.MILD TO MODERATE POSTERIOR WALL HYPOKINESIS.PACEMAKER LEAD IN THE RV. LA IS MILDLY DILATED. RA IS MODERATE TO SEVERLY DILATED. PACEMAKER LEAD IN THE RA. MILD CALCICICATION OF THE MV APPARATUS. MODERATE MR. MILD TO MODERATE TR. MILD PHTN.AV MILDLY SCLEROTIC.  [name] ESOPHAGOGASTRODUODENOSCOPY N/A 10/13/2012   Dr. Gala Romney: severe ulcerative reflux esophagitis, question of Barrett's but negative path, single deep prepyloric antral ulcer, negative H.pylori  . ESOPHAGOGASTRODUODENOSCOPY N/A 03/03/2017   Procedure: ESOPHAGOGASTRODUODENOSCOPY (EGD);  Surgeon: Danie Binder, MD;  [facility];  Service: Endoscopy;  Laterality: N/A;  . HERNIA REPAIR     right inguinal hernia and umbilical  . LOWETR EXT VENOUS Bilateral 11-08-10   R & L- NO EVIDENCE OF THROMBUS OR THROMBOPHLEBITIS. THERE IS MILD AMOUNT OF  SUBCUTANEOUS EDEMA NOTED WITHIN THE LEFT CALF AND ANKLE. R & L GSV AND SSV- NO VENOUS INSUFF NOTED.  [name] NECK SURGERY    . NUCLEAR STRESS TEST N/A 02/13/2009   NORMAL PATTERN OF PERFUSION IN ALL REGIONS. POST STRESS VENTICULAR SIZE IS NORMAL. POST  STESS EF 85%.  NORMAL MYOCARDIAL PERFUSION STUDY.  [name] OPEN REDUCTION INTERNAL FIXATION (ORIF) DISTAL PHALANX Left 11/16/2018   Procedure: MIDDLE FINGER OPEN REDUCTION VERSUS RECONSTRUCTION;  Surgeon: Roseanne Kaufman, MD;  Location: [facility];  Service: Orthopedics;  Laterality: Left;  . PACEMAKER INSERTION    . POLYPECTOMY  03/03/2017   Procedure: POLYPECTOMY;  Surgeon: Danie Binder, MD;  [facility];  Service: Endoscopy;;  colon  . TONSILLECTOMY    . YAG LASER APPLICATION Bilateral 9/62/8366   Procedure: YAG LASER APPLICATION;  Surgeon: [place] Che, MD;  Location: [facility];  Service: Ophthalmology;  Laterality: Bilateral;     Current Outpatient Medications  Medication Sig Dispense Refill  . acetaminophen (TYLENOL) 325 MG tablet Take 2 tablets (650 mg total) by mouth every 6 (six) hours as needed for mild pain, fever or headache (or Fever >/= 101). 12 tablet 0  . carvedilol (COREG) 6.25 MG tablet Take 1 tablet (6.25 mg total) by mouth 2 (two) times daily with a meal. 60 tablet 2  . colchicine 0.6 MG tablet Take 1 tablet (0.6 mg total) by mouth daily. (Patient not taking: Reported on 02/17/2019) 14 tablet 0  . ELIQUIS 2.5 MG TABS tablet TAKE 1 TABLET BY MOUTH  TWICE A DAY 180 tablet 1  . furosemide (LASIX) 20 MG tablet TAKE 1 TABLET BY MOUTH  DAILY 90 tablet 1  . mirtazapine (REMERON) 7.5 MG tablet Take 1 tablet (7.5 mg total) by mouth at bedtime. (Patient not taking: Reported on 02/17/2019) 30 tablet 1  . Multiple Vitamins-Minerals (PRESERVISION AREDS 2) CAPS Take 1 capsule by mouth 2 (two) times daily.     [name] omeprazole (PRILOSEC OTC) 20 MG tablet Take 1 tablet (20 mg total) by mouth daily. (Patient not taking: Reported on 02/17/2019) 30 tablet 0  . Polyethyl Glycol-Propyl Glycol (SYSTANE ULTRA) 0.4-0.3 % SOLN Place 1-2 drops into both eyes 2 (two) times daily as needed (dry eyes).     . traZODone (DESYREL) 150 MG tablet Take 1 tablet (150 mg total) by mouth at bedtime. (Patient not taking: Reported on 02/17/2019) 30 tablet 3   No current facility-administered medications for this visit.    Allergies:   Ciprofloxacin, Flagyl [metronidazole], Iron, Papaya derivatives, Iodine, Penicillins, and Sulfa  antibiotics    Social History:  The patient  reports that she quit smoking about 14 years ago. Her smoking use included cigarettes. She has a 15.00 pack-year smoking history. She has never used smokeless tobacco. She reports current alcohol use. She reports that she does not use drugs.   Family History:  The patient's family history includes Asthma in her sister; Cancer in her brother and sister; Heart failure in her brother; Pulmonary embolism in her brother.    ROS: All other systems are reviewed and negative. Unless otherwise mentioned in H&P    PHYSICAL EXAM: VS:  There were no vitals taken for this visit. , BMI There is no height or weight on file to calculate BMI. GEN: Well nourished, well developed, in no acute distress HEENT: normal Neck: no JVD, carotid bruits, or masses Cardiac: ***RRR; no murmurs, rubs, or gallops,no edema  Respiratory:  Clear to auscultation bilaterally, normal work of  breathing GI: soft, nontender, nondistended, + BS MS: no deformity or atrophy Skin: warm and dry, no rash Neuro:  Strength and sensation are intact Psych: euthymic mood, full affect   EKG:  EKG {ACTION; IS/IS [id number]} ordered today. The ekg ordered today demonstrates ***   Recent Labs: 10/26/2018: Magnesium 2.2 11/03/2018: ALT 7 11/17/2018: BUN 22; Creatinine, Ser 1.25; Hemoglobin 9.4; Platelets 157; Potassium 4.2; Sodium 135    Lipid Panel    Component Value Date/Time   CHOL 179 03/15/2018 1645   TRIG 65 03/15/2018 1645   HDL 84 03/15/2018 1645   CHOLHDL 2.1 03/15/2018 1645   CHOLHDL 2.3 02/06/2009 0450   VLDL 16 02/06/2009 0450   LDLCALC 82 03/15/2018 1645      Wt Readings from Last 3 Encounters:  12/27/18 139 lb (63 kg)  12/13/18 143 lb 9.6 oz (65.1 kg)  11/16/18 149 lb (67.6 kg)      Other studies Reviewed: Echocardiogram December 26, 2014  - Left ventricle: The cavity size was normal. Wall thickness was   normal. Systolic function was normal. The estimated ejection    fraction was in the range of 60% to 65%. Wall motion was normal;   there were no regional wall motion abnormalities. - Aortic valve: Mildly calcified annulus. Trileaflet; mildly   thickened leaflets. Valve area (VTI): 1.59 cm^2. Valve area   (Vmax): 1.8 cm^2. - Mitral valve: Mildly calcified annulus. Mildly thickened leaflets   . There was mild regurgitation. - Left atrium: The atrium was severely dilated. - Right atrium: The atrium was severely dilated. - Pulmonary arteries: Systolic pressure was mildly to moderately   increased. PA peak pressure: 39 mm Hg (S). - Inferior vena cava: The vessel was dilated. The respirophasic   diameter changes were blunted (< 50%), consistent with elevated   central venous pressure. - Technically adequate study.   ASSESSMENT AND PLAN:  1.  ***   Current medicines are reviewed at length with the patient today.    Labs/ tests ordered today include: *** Phill Myron. [place] P[place], ANP, AACC   05/21/2019 7:45 AM    [place] [place] [address] Office [phone] Fax [phone]  Notice: This dictation was prepared with Dragon dictation along with smaller phrase technology. Any transcriptional errors that result from this process are unintentional and may not be corrected upon review.

## 2019-05-23 ENCOUNTER — Ambulatory Visit: Payer: Medicare Other | Admitting: Adult Health

## 2019-05-25 ENCOUNTER — Ambulatory Visit (INDEPENDENT_AMBULATORY_CARE_PROVIDER_SITE_OTHER): Payer: Medicare Other | Admitting: Licensed Clinical Social Worker

## 2019-05-25 DIAGNOSIS — F331 Major depressive disorder, recurrent, moderate: Secondary | ICD-10-CM

## 2019-05-25 DIAGNOSIS — I10 Essential (primary) hypertension: Secondary | ICD-10-CM

## 2019-05-25 DIAGNOSIS — F3178 Bipolar disorder, in full remission, most recent episode mixed: Secondary | ICD-10-CM

## 2019-05-25 DIAGNOSIS — M797 Fibromyalgia: Secondary | ICD-10-CM

## 2019-05-25 NOTE — Patient Instructions (Addendum)
Licensed Clinical Social Worker Visit Information  Goals we discussed today:  Goals    . Client verbalizes she wants to talk with someone about anxiety and stress about managing her health conditions (patient stated) (pt-stated)     Current Barriers:  . Transportation . Social Isolation . Mental Health Challenges in patient with Chronic Diagnoses of Hx Bipolar Disorder, Fibromyalgia, HTN,  Depression, and CKD  Clinical Social Work Clinical Goal(s):  [name] [name] will talk with client in next 30 days to discuss anxiety and stress issues about managing her health conditions.   Interventions:   Previously talked with client about current social work needs of client  Previously talked with client about social support network of client  Previously talked  with client about relaxation techniques to help client manage stress/anxiety symptoms.   Talked previously with client about ADTS support ([name] gave client phone number for ADTS)  Talked previously with client about pain issus of client . Talked previously with client about ambulation needs of client (uses walker to ambulate)   Patient Self Care Activities:  . Takes medications as prescribed . Talks to PCP as needed about medical needs of client  . Attends medical appointments as scheduled  Plan: Client to call [name] to discuss social work needs of client Client to talk with RNCM about nursing needs of client [name] to call client in next 4 weeks to assess psychosocial needs of client  Initial goal documentation        Materials Provided: No  Follow Up Plan:  [name] to call client in next 4 weeks to talk with clinet about the psychosocial needs of client at that time  The patient verbalized understanding of instructions provided today and declined a print copy of patient instruction materials.   [name].Bryleigh Ottaway MSW, [name] Licensed Clinical Social Worker [facility] Care Management [phone]

## 2019-05-25 NOTE — Chronic Care Management (AMB) (Signed)
  Care Management Note   Kimberly [name] is a 86 y.o. year old female who is a primary care patient of [name], DO. The CM team was consulted for assistance with chronic disease management and care coordination.   I reached out to [name] by phone today.   Review of patient status, including review of consultants reports, relevant laboratory and other test results, and collaboration with appropriate care team members and the patient's provider was performed as part of comprehensive patient evaluation and provision of chronic care management services.   Social determinants of health risk of social isolation; risk of tobacco use; risk of depression; risk of physical inactivity    Office Visit from 12/13/2018 in [place]  PHQ-9 Total Score  10     Medications   (very important)  New medications from outside sources are available for reconciliation  acetaminophen (TYLENOL) 325 MG tablet carvedilol (COREG) 6.25 MG tablet colchicine 0.6 MG tablet ELIQUIS 2.5 MG TABS tablet furosemide (LASIX) 20 MG tablet mirtazapine (REMERON) 7.5 MG tablet Multiple Vitamins-Minerals (PRESERVISION AREDS 2) CAPS omeprazole (PRILOSEC OTC) 20 MG tablet Polyethyl Glycol-Propyl Glycol (SYSTANE ULTRA) 0.4-0.3 % SOLN traZODone (DESYREL) 150 MG tablet   Goals    . Client verbalizes she wants to talk with someone about anxiety and stress about managing her health conditions (patient stated) (pt-stated)     Current Barriers:  . Transportation . Social Isolation . Mental Health Challenges in patient with Chronic Diagnoses of Hx Bipolar Disorder, Fibromyalgia, HTN,  Depression, and CKD  Clinical Social Work Clinical Goal(s):  [name] [name] will talk with client in next 30 days to discuss anxiety and stress issues about managing her health conditions.   Interventions:  Previously talked with client about current social work needs of client  Previously talked with client  about social support network of client  Previously talked  with client about relaxation techniques to help client manage stress/anxiety symptoms.   Talked previously with client about ADTS support ([name] gave client phone number for ADTS)  Talked previously with client about pain issus of client . Talked previously with client about ambulation needs of client (uses walker to ambulate)   Patient Self Care Activities:  . Takes medications as prescribed . Talks to PCP as needed about medical needs of client  . Attends medical appointments as scheduled  Plan: Client to call [name] to discuss social work needs of client Client to talk with RNCM about nursing needs of client [name] to call client in next 4 weeks to assess psychosocial needs of client  Initial goal documentation       Follow Up Plan: [name] will call client in next 4 weeks to talk with client about the psychosocial needs of client at that time  [name].Zaara Sprowl MSW, [name] Licensed Clinical Social Worker [facility] Care [facility] [phone]

## 2019-05-26 ENCOUNTER — Ambulatory Visit: Payer: Medicare Other | Admitting: General Practice

## 2019-06-01 ENCOUNTER — Telehealth: Payer: Self-pay | Admitting: Cardiovascular Disease

## 2019-06-01 NOTE — Telephone Encounter (Signed)
Patient calling stating she has not had a home remote pacer check in a while and believes she may be due. She also states she is unsure if her pacemaker needs to be replaced. She would like a call back to discuss.

## 2019-06-01 NOTE — Telephone Encounter (Signed)
Pt called to report that she is getting concerned that she needs her Pacer checked... the plan was to check it with [employer] at her 05/23/19 appt but she says she had to cancel.. she says that she will send a transmission now, she knows how to send one.. I will forward to Dr. Lum Keas for review.

## 2019-06-03 ENCOUNTER — Other Ambulatory Visit: Payer: Self-pay | Admitting: Cardiovascular Disease

## 2019-06-06 NOTE — Telephone Encounter (Signed)
Normal device function by check 06/04/2018. Note abrupt and marked increase in activity level from 0.5 h/day to 6 hours a day by device check. This level of activity has correlated in the past with periods of mania from her bipolar disorder.

## 2019-06-07 NOTE — Telephone Encounter (Signed)
Patient made aware and verbalized her understanding. 

## 2019-06-21 ENCOUNTER — Ambulatory Visit (INDEPENDENT_AMBULATORY_CARE_PROVIDER_SITE_OTHER): Payer: Medicare Other | Admitting: Licensed Clinical Social Worker

## 2019-06-21 DIAGNOSIS — F3178 Bipolar disorder, in full remission, most recent episode mixed: Secondary | ICD-10-CM | POA: Diagnosis not present

## 2019-06-21 DIAGNOSIS — I10 Essential (primary) hypertension: Secondary | ICD-10-CM

## 2019-06-21 DIAGNOSIS — F331 Major depressive disorder, recurrent, moderate: Secondary | ICD-10-CM | POA: Diagnosis not present

## 2019-06-21 DIAGNOSIS — M797 Fibromyalgia: Secondary | ICD-10-CM

## 2019-06-21 NOTE — Chronic Care Management (AMB) (Signed)
  Care Management Note   Kimberly [name] is a 86 y.o. year old female who is a primary care patient of [name], DO. The CM team was consulted for assistance with chronic disease management and care coordination.   I reached out to [name] by phone today.  Review of patient status, including review of consultants reports, relevant laboratory and other test results, and collaboration with appropriate care team members and the patient's provider was performed as part of comprehensive patient evaluation and provision of chronic care management services.   Social determinants of health: risk of social isolation; risk of tobacco use; risk of depression; risk of physical inactivity    Office Visit from 12/13/2018 in [place]  PHQ-9 Total Score  10     Medications   (very important)  New medications from outside sources are available for reconciliation  acetaminophen (TYLENOL) 325 MG tablet carvedilol (COREG) 6.25 MG tablet colchicine 0.6 MG tablet ELIQUIS 2.5 MG TABS tablet furosemide (LASIX) 20 MG tablet mirtazapine (REMERON) 7.5 MG tablet Multiple Vitamins-Minerals (PRESERVISION AREDS 2) CAPS omeprazole (PRILOSEC OTC) 20 MG tablet Polyethyl Glycol-Propyl Glycol (SYSTANE ULTRA) 0.4-0.3 % SOLN traZODone (DESYREL) 150 MG tablet  Goals    . Client verbalizes she wants to talk with someone about anxiety and stress about managing her health conditions (patient stated) (pt-stated)     Current Barriers:  . Transportation . Social Isolation . Mental Health Challenges in patient with Chronic Diagnoses of Hx Bipolar Disorder, Fibromyalgia, HTN,  Depression, and CKD  Clinical Social Work Clinical Goal(s):  [name] [name] will talk with client in next 30 days to discuss anxiety and stress issues about managing her health conditions.   Interventions:   Talked with client about current social work needs of client  Talked with client about social support  network of client  Talked with client about relaxation techniques to help client manage stress/anxiety symptoms.   Talked with client about ADTS support ([name] gave client phone number for ADTS)  Talked with client about pain issus of client  Talked with client about ambulation needs of client (uses walker to ambulate)   Talked with client about weakness about exertion (she said she paces herself for activities and takes rest breaks as needed)  Patient Self Care Activities:  . Takes medications as prescribed . Talks to PCP as needed about medical needs of client  . Attends medical appointments as scheduled  Plan: Client to call [name] to discuss social work needs of client Client to talk with RNCM about nursing needs of client [name] to call client in next 4 weeks to assess psychosocial needs of client  Initial goal documentation           Follow Up Plan: [name] to call client in next 4 weeks to assess the psychosocial needs of client at that time  [name].Eloyse Causey MSW, [name] Licensed Clinical Social Worker [facility] Care [facility] [phone]

## 2019-06-21 NOTE — Patient Instructions (Addendum)
Licensed Clinical Social Worker Visit Information  Goals we discussed today:  Goals    . Client verbalizes she wants to talk with someone about anxiety and stress about managing her health conditions (patient stated) (pt-stated)     Current Barriers:  . Transportation . Social Isolation . Mental Health Challenges in patient with Chronic Diagnoses of Hx Bipolar Disorder, Fibromyalgia, HTN,  Depression, and CKD  Clinical Social Work Clinical Goal(s):  [name] [name] will talk with client in next 30 days to discuss anxiety and stress issues about managing her health conditions.   . Interventions: . Talked with client about current social work needs of client  Talked with client about social support network of client  Talked with client about relaxation techniques to help client manage stress/anxiety symptoms.  Talked with client about ADTS support ([name] gave client phone number for ADTS)  Talked with client about pain issus of client Talked with client about ambulation needs of client (uses walker to ambulate)  Talked with client about weakness about exertion (she said she paces herself for activities and takes rest breaks as needed)  Patient Self Care Activities:  . Takes medications as prescribed . Talks to PCP as needed about medical needs of client  . Attends medical appointments as scheduled  Plan: Client to call [name] to discuss social work needs of client Client to talk with RNCM about nursing needs of client [name] to call client in next 4 weeks to assess psychosocial needs of client  Initial goal documentation           Materials Provided: No  Follow Up Plan: [name] to call client in next 4 weeks to assess the psychosocial needs of client at that time  The patient verbalized understanding of instructions provided today and declined a print copy of patient instruction materials.   [name].Deseree Zemaitis MSW, [name] Licensed Clinical Social Worker [facility]  Care Management [phone]

## 2019-06-29 ENCOUNTER — Other Ambulatory Visit: Payer: Self-pay | Admitting: Cardiovascular Disease

## 2019-07-04 ENCOUNTER — Other Ambulatory Visit: Payer: Self-pay | Admitting: Family Medicine

## 2019-07-19 ENCOUNTER — Ambulatory Visit (INDEPENDENT_AMBULATORY_CARE_PROVIDER_SITE_OTHER): Payer: Medicare Other | Admitting: Licensed Clinical Social Worker

## 2019-07-19 DIAGNOSIS — F331 Major depressive disorder, recurrent, moderate: Secondary | ICD-10-CM | POA: Diagnosis not present

## 2019-07-19 DIAGNOSIS — F3178 Bipolar disorder, in full remission, most recent episode mixed: Secondary | ICD-10-CM | POA: Diagnosis not present

## 2019-07-19 DIAGNOSIS — M797 Fibromyalgia: Secondary | ICD-10-CM

## 2019-07-19 DIAGNOSIS — I10 Essential (primary) hypertension: Secondary | ICD-10-CM

## 2019-07-19 NOTE — Chronic Care Management (AMB) (Signed)
  Care Management Note   Kimberly [name] is a 86 y.o. year old female who is a primary care patient of [name], DO. The CM team was consulted for assistance with chronic disease management and care coordination.   I reached out to [name] by phone today.   Review of patient status, including review of consultants reports, relevant laboratory and other test results, and collaboration with appropriate care team members and the patient's provider was performed as part of comprehensive patient evaluation and provision of chronic care management services.   Social determinants of health: risk of social isolation; risk of tobacco use; risk of depression; risk of physical inactivity    Office Visit from 12/13/2018 in [place]  PHQ-9 Total Score  10     Medications   (very important)  New medications from outside sources are available for reconciliation  acetaminophen (TYLENOL) 325 MG tablet carvedilol (COREG) 6.25 MG tablet colchicine 0.6 MG tablet ELIQUIS 2.5 MG TABS tablet furosemide (LASIX) 20 MG tablet mirtazapine (REMERON) 7.5 MG tablet Multiple Vitamins-Minerals (PRESERVISION AREDS 2) CAPS omeprazole (PRILOSEC OTC) 20 MG tablet Polyethyl Glycol-Propyl Glycol (SYSTANE ULTRA) 0.4-0.3 % SOLN traZODone (DESYREL) 150 MG tablet  Goals    . Client verbalizes she wants to talk with someone about anxiety and stress about managing her health conditions (patient stated) (pt-stated)     Current Barriers:  . Transportation . Social Isolation . Mental Health Challenges in patient with Chronic Diagnoses of Hx Bipolar Disorder, Fibromyalgia, HTN,  Depression, and CKD  Clinical Social Work Clinical Goal(s):  [name] [name] will talk with client in next 30 days to discuss anxiety and stress issues about managing her health conditions.   Interventions:  Talked with client about current social work needs of client  Talked with client about social support  network of client  Talked with client about relaxation techniques to help client manage stress/anxiety symptoms.   [place] client about ADTS support ([name] gave client phone number for ADTS)  [place] client about pain issus of client  Talked with client about ambulation needs of client (uses walker to ambulate) . Talked with client about weakness about exertion (she said she paces herself for activities and takes rest breaks as needed  . Talked with client about client's appointment schedule  Patient Self Care Activities:  . Takes medications as prescribed . Talks to PCP as needed about medical needs of client  . Attends medical appointments as scheduled  Plan: Client to call [name] to discuss social work needs of client Client to talk with RNCM about nursing needs of client [name] to call client in next 4 weeks to assess psychosocial needs of client  Initial goal documentation       Follow Up Plan: [name] to call client in next 4 weeks to assess the psychosocial needs of client at that time  [name].Aneka Fagerstrom MSW, [name] Licensed Clinical Social Worker [facility] [phone]

## 2019-07-19 NOTE — Patient Instructions (Addendum)
Licensed Clinical Social Worker Visit Information  Goals we discussed today:  Goals    . Client verbalizes she wants to talk with someone about anxiety and stress about managing her health conditions (patient stated) (pt-stated)     Current Barriers:  . Transportation . Social Isolation . Mental Health Challenges in patient with Chronic Diagnoses of Hx Bipolar Disorder, Fibromyalgia, HTN,  Depression, and CKD  Clinical Social Work Clinical Goal(s):  [name] [name] will talk with client in next 30 days to discuss anxiety and stress issues about managing her health conditions.   Interventions:   Talked with client about current social work needs of client  Talked with client about social support network of client  Talked with client about relaxation techniques to help client manage stress/anxiety symptoms.  Talked with client about ADTS support ([name] gave client phone number for ADTS)  Talked with client about pain issus of client Talked with client about ambulation needs of client (uses walker to ambulate)  Talked with client about weakness about exertion (she said she paces herself for activities and takes rest breaks as needed  Talked with client about client's appointment schedule  Patient Self Care Activities:  . Takes medications as prescribed . Talks to PCP as needed about medical needs of client  . Attends medical appointments as scheduled  Plan: Client to call [name] to discuss social work needs of client Client to talk with RNCM about nursing needs of client [name] to call client in next 4 weeks to assess psychosocial needs of client  Initial goal documentation     Materials Provided: No  Follow Up Plan: [name] to call client in next 4 weeks to assess the psychosocial needs of client at that time  The patient verbalized understanding of instructions provided today and declined a print copy of patient instruction materials.   [name].Birch Farino MSW, [name] Licensed Clinical Social  Worker [facility] Care Management [phone]

## 2019-07-25 ENCOUNTER — Other Ambulatory Visit: Payer: Self-pay | Admitting: *Deleted

## 2019-08-01 ENCOUNTER — Other Ambulatory Visit: Payer: Self-pay | Admitting: Family Medicine

## 2019-08-01 MED ORDER — TRAZODONE HCL 150 MG PO TABS: 150.0000 mg | ORAL_TABLET | Freq: Every day | ORAL | 3 refills | Status: DC

## 2019-08-04 ENCOUNTER — Other Ambulatory Visit: Payer: Self-pay | Admitting: Family Medicine

## 2019-08-09 ENCOUNTER — Ambulatory Visit: Payer: Medicare Other | Admitting: *Deleted

## 2019-08-09 ENCOUNTER — Telehealth: Payer: Self-pay | Admitting: Family Medicine

## 2019-08-09 NOTE — Progress Notes (Signed)
  Chronic Care Management   Outreach Note  08/09/2019 Name: Kimberly [name] MRN: [id number] DOB: [birth year]  Referred by: [name], DO Reason for referral : Chronic Care Management (RN follow up)   An unsuccessful telephone follow-up was attempted today. The patient was referred to the case management team for assistance with care management and care coordination.   Follow Up Plan: A HIPPA compliant phone message was left for the patient providing contact information and requesting a return call.  The care management team will reach out to the patient again over the next 30 days.   [name], BSN, RN-BC Embedded Chronic Care Manager [facility] [place] [facility] Direct Dial: [phone]

## 2019-08-09 NOTE — Chronic Care Management (AMB) (Signed)
  Care Management   Note  08/09/2019 Name: Kimberly [name] MRN: [id number] DOB: May 24, 1926  Kimberly [name] is a 86 y.o. year old female who is a primary care patient of [name], DO and is actively engaged with the care management team. I reached out to [name] by phone today to assist with re-scheduling a follow up visit with the Licensed Clinical Social Worker  Follow up plan: Unsuccessful telephone outreach attempt made.If patient returns call to provider office, please advise to call [facility] at [phone].  [place], [place] [address] Direct Dial: [phone] [email or web address] Website: [email or web address]

## 2019-08-12 NOTE — Chronic Care Management (AMB) (Signed)
  Care Management   Note  08/12/2019 Name: Kimberly [name] MRN: [id number] DOB: 07/09/1926  Kimberly [name] is a 86 y.o. year old female who is a primary care patient of [name], DO and is actively engaged with the care management team. I reached out to [name] by phone today to assist with re-scheduling a follow up visit with the Licensed Clinical Social Worker  Follow up plan: Telephone appointment with care management team member scheduled for:08/15/2019.  [place], [place] [address] Direct Dial: [phone] [email or web address] Website: [email or web address]

## 2019-08-15 ENCOUNTER — Ambulatory Visit (INDEPENDENT_AMBULATORY_CARE_PROVIDER_SITE_OTHER): Payer: Medicare Other | Admitting: Licensed Clinical Social Worker

## 2019-08-15 DIAGNOSIS — M797 Fibromyalgia: Secondary | ICD-10-CM

## 2019-08-15 DIAGNOSIS — F331 Major depressive disorder, recurrent, moderate: Secondary | ICD-10-CM

## 2019-08-15 DIAGNOSIS — F3178 Bipolar disorder, in full remission, most recent episode mixed: Secondary | ICD-10-CM

## 2019-08-15 DIAGNOSIS — I10 Essential (primary) hypertension: Secondary | ICD-10-CM

## 2019-08-15 NOTE — Patient Instructions (Addendum)
Licensed Clinical Social Worker Visit Information  Goals we discussed today:  Goals    . Client verbalizes she wants to talk with someone about anxiety and stress about managing her health conditions (patient stated) (pt-stated)     Current Barriers:  . Transportation . Social Isolation . Mental Health Challenges in patient with Chronic Diagnoses of Hx Bipolar Disorder, Fibromyalgia, HTN,  Depression, and CKD  Clinical Social Work Clinical Goal(s):  [name] [name] will talk with client in next 30 days to discuss anxiety and stress issues about managing her health conditions.   Interventions:  Talked with client about current social work needs of client  Talked with client about social support network of client  Talked with client about relaxation techniques to help client manage stress/anxiety symptoms.   [place] client about ADTS support ([name] gave client phone number for ADTS)  [place] client about pain issus of client  Talked with client about ambulation needs of client (uses walker to ambulate)  Talked with client about weakness about exertion (she said she paces herself for activities and takes rest breaks as needed  Talked with client about client's appointment schedule  Talked with client about sleep issues of client  Patient Self Care Activities:  . Takes medications as prescribed . Talks to PCP as needed about medical needs of client  . Attends medical appointments as scheduled  Plan: Client to call [name] to discuss social work needs of client Client to talk with RNCM about nursing needs of client [name] to call client in next 4 weeks to assess psychosocial needs of client  Initial goal documentation       Materials Provided: No  Follow Up Plan: [name] to call client in next 4 weeks to assess the psychosocial needs of client at that time  The patient verbalized understanding of instructions provided today and declined a print copy of patient instruction  materials.   [name].Karman Veney MSW, [name] Licensed Clinical Social Worker [facility] [phone]

## 2019-08-15 NOTE — Chronic Care Management (AMB) (Signed)
Chronic Care Management    Clinical Social Work Follow Up Note  08/15/2019 Name: LEVONIA WOLFLEY MRN: [id number] DOB: 05/07/1926  HALEE GLYNN is a 86 y.o. year old female who is a primary care patient of [name], DO. The CCM team was consulted for assistance with [name] .   Review of patient status, including review of consultants reports, other relevant assessments, and collaboration with appropriate care team members and the patient's provider was performed as part of comprehensive patient evaluation and provision of chronic care management services.    SDOH (Social Determinants of Health) assessments performed: Yes  Risk of depression; risk of social isolation    Office Visit from 12/13/2018 in [facility]  PHQ-9 Total Score  10      Outpatient Encounter Medications as of 08/15/2019  Medication Sig  . acetaminophen (TYLENOL) 325 MG tablet Take 2 tablets (650 mg total) by mouth every 6 (six) hours as needed for mild pain, fever or headache (or Fever >/= 101).  . carvedilol (COREG) 6.25 MG tablet TAKE 1 TABLET BY MOUTH TWO  TIMES DAILY WITH A MEAL  . colchicine 0.6 MG tablet Take 1 tablet (0.6 mg total) by mouth daily. (Patient not taking: Reported on 02/17/2019)  . ELIQUIS 2.5 MG TABS tablet TAKE 1 TABLET BY MOUTH  TWICE DAILY  . furosemide (LASIX) 20 MG tablet TAKE 1 TABLET BY MOUTH  DAILY  . mirtazapine (REMERON) 7.5 MG tablet TAKE ONE TABLET AT BEDTIME  . Multiple Vitamins-Minerals (PRESERVISION AREDS 2) CAPS Take 1 capsule by mouth 2 (two) times daily.   [name] omeprazole (PRILOSEC OTC) 20 MG tablet Take 1 tablet (20 mg total) by mouth daily. (Patient not taking: Reported on 02/17/2019)  . Polyethyl Glycol-Propyl Glycol (SYSTANE ULTRA) 0.4-0.3 % SOLN Place 1-2 drops into both eyes 2 (two) times daily as needed (dry eyes).   . traZODone (DESYREL) 150 MG tablet Take 1 tablet (150 mg total) by mouth at bedtime.   No facility-administered  encounter medications on file as of 08/15/2019.    Goals    . Client verbalizes she wants to talk with someone about anxiety and stress about managing her health conditions (patient stated) (pt-stated)     Current Barriers:  . Transportation . Social Isolation . Mental Health Challenges in patient with Chronic Diagnoses of Hx Bipolar Disorder, Fibromyalgia, HTN,  Depression, and CKD  Clinical Social Work Clinical Goal(s):  [name] [name] will talk with client in next 30 days to discuss anxiety and stress issues about managing her health conditions.   Interventions:  Talked with client about current social work needs of client  Talked with client about social support network of client  Talked with client about relaxation techniques to help client manage stress/anxiety symptoms.   [place] client about ADTS support ([name] gave client phone number for ADTS)  [place] client about pain issus of client  Talked with client about ambulation needs of client (uses walker to ambulate)  Talked with client about weakness about exertion (she said she paces herself for activities and takes rest breaks as needed   Talked with client about client's appointment schedule  Talked with client about sleep issues of client  Patient Self Care Activities:  . Takes medications as prescribed . Talks to PCP as needed about medical needs of client  . Attends medical appointments as scheduled  Plan: Client to call [name] to discuss social work needs of client Client to talk with [facility] about nursing  needs of client [name] to call client in next 4 weeks to assess psychosocial needs of client  Initial goal documentation     Follow Up Plan: [name] to call client in next 4 weeks to assess the psychosocial needs of client at that time  [name].Mohmed Farver MSW, [name] Licensed Clinical Social Worker [facility] [phone]

## 2019-08-18 ENCOUNTER — Telehealth: Payer: Self-pay

## 2019-08-25 ENCOUNTER — Other Ambulatory Visit: Payer: Self-pay | Admitting: Cardiovascular Disease

## 2019-08-25 DIAGNOSIS — Z950 Presence of cardiac pacemaker: Secondary | ICD-10-CM

## 2019-08-25 DIAGNOSIS — I5043 Acute on chronic combined systolic (congestive) and diastolic (congestive) heart failure: Secondary | ICD-10-CM

## 2019-08-26 NOTE — Telephone Encounter (Signed)
Rx(s) sent to pharmacy electronically.  

## 2019-08-30 ENCOUNTER — Telehealth: Payer: Medicare Other

## 2019-09-01 ENCOUNTER — Ambulatory Visit: Payer: Medicare Other | Admitting: *Deleted

## 2019-09-01 DIAGNOSIS — F3178 Bipolar disorder, in full remission, most recent episode mixed: Secondary | ICD-10-CM

## 2019-09-01 DIAGNOSIS — F331 Major depressive disorder, recurrent, moderate: Secondary | ICD-10-CM

## 2019-09-01 DIAGNOSIS — I4821 Permanent atrial fibrillation: Secondary | ICD-10-CM | POA: Diagnosis not present

## 2019-09-01 DIAGNOSIS — I10 Essential (primary) hypertension: Secondary | ICD-10-CM

## 2019-09-01 NOTE — Chronic Care Management (AMB) (Signed)
Chronic Care Management   Follow Up Note   09/01/2019 Name: Kimberly [name] MRN: [id number] DOB: May 26, 1926  Referred by: [name], DO Reason for referral : Chronic Care Management (RN Follow up)   Kimberly [name] is a 86 y.o. year old female who is a primary care patient of [name], DO. The CCM team was consulted for assistance with chronic disease management and care coordination needs.    Review of patient status, including review of consultants reports, relevant laboratory and other test results, and collaboration with appropriate care team members and the patient's provider was performed as part of comprehensive patient evaluation and provision of chronic care management services.    SDOH (Social Determinants of Health) assessments performed: Yes See Care Plan activities for detailed interventions related to [facility])     Outpatient Encounter Medications as of 09/01/2019  Medication Sig  . acetaminophen (TYLENOL) 325 MG tablet Take 2 tablets (650 mg total) by mouth every 6 (six) hours as needed for mild pain, fever or headache (or Fever >/= 101).  . carvedilol (COREG) 6.25 MG tablet TAKE 1 TABLET BY MOUTH TWO  TIMES DAILY WITH A MEAL  . colchicine 0.6 MG tablet Take 1 tablet (0.6 mg total) by mouth daily. (Patient not taking: Reported on 02/17/2019)  . ELIQUIS 2.5 MG TABS tablet TAKE 1 TABLET BY MOUTH  TWICE DAILY  . furosemide (LASIX) 20 MG tablet TAKE 1 TABLET BY MOUTH  DAILY  . mirtazapine (REMERON) 7.5 MG tablet TAKE ONE TABLET AT BEDTIME  . Multiple Vitamins-Minerals (PRESERVISION AREDS 2) CAPS Take 1 capsule by mouth 2 (two) times daily.   Kimberly [name] omeprazole (PRILOSEC OTC) 20 MG tablet Take 1 tablet (20 mg total) by mouth daily. (Patient not taking: Reported on 02/17/2019)  . Polyethyl Glycol-Propyl Glycol (SYSTANE ULTRA) 0.4-0.3 % SOLN Place 1-2 drops into both eyes 2 (two) times daily as needed (dry eyes).   . traZODone (DESYREL) 150 MG tablet Take 1 tablet (150 mg  total) by mouth at bedtime.   No facility-administered encounter medications on file as of 09/01/2019.     RN Care Plan   . "I would like to sleep better" (pt-stated)       "I still don't sleep well but nothing helps so I've just gotten use to it."  Sleep disturbance in patient with bipolar disorder  Current Barriers:  Kimberly [name] Knowledge Deficits related to effective sleep improvement strategies . Lacks caregiver support.   Nurse Case Manager Clinical Goal(s):  Kimberly [name] Over the next 30 days, patient will work with Dr Lajuana Ripple to address needs related to insomnia  Interventions:  . Evaluation of current treatment plan related to insomnia and patient's adherence to plan as established by provider. . Reviewed medications with patient and discussed remeron and trazadone o Doesn't feel that they help . Advised patient to take medication as directed and practice good sleep hygiene . Discussed nocturia o Advised to cut fluids off several hours before bed o Patient can't go back to sleep once she's gotten up to use the bathroom . Appointment scheduled with PCP for 5/25 to discuss concerns and f/u on chronic medical conditions . Discussed plans with patient for ongoing care management follow up and provided patient with direct contact information for care management team  Patient Self Care Activities:  . Performs ADL's independently . Has help with transportation and shopping   Please see past updates related to this goal by clicking on the "Past Updates" button in the  selected goal      . Chronic Disease Management Needs (pt-stated)       Current Barriers:  . Chronic Disease Management support, education, and care coordination needs related to Atrial Fibrillation, CHF, HTN, and Bipolar Disorder  Clinical Goal(s) related to Atrial Fibrillation, CHF, HTN, and Bipolar Disorder:  Over the next 60 days, patient will:  . Work with the care management team to address educational, disease management,  and care coordination needs  . Call provider office for new or worsened signs and symptoms . Call care management team with questions or concerns . Verbalize basic understanding of patient centered plan of care established today  Interventions related to Atrial Fibrillation, CHF, HTN, and Bipolar Disorder:  . Evaluation of current treatment plans and patient's adherence to plan as established by provider . Assessed patient understanding of disease states . Assessed patient's education and care coordination needs . Provided basic disease specific education to patient  . Collaborated with appropriate clinical care team members regarding patient needs . Appointment scheduled with Dr Lajuana Ripple for 09/27/19 for follow-up  Patient Self Care Activities related to Atrial Fibrillation, CHF, HTN, and Bipolar Disorder:  . Patient is unable to independently self-manage chronic health conditions  Please see past updates related to this goal by clicking on the "Past Updates" button in the selected goal          Plan:   The care management team will reach out to the patient again over the next 45 days.    [name], BSN, RN-BC Embedded Chronic Care Manager [facility] / [place] [facility] Direct Dial: [phone]

## 2019-09-01 NOTE — Patient Instructions (Signed)
Visit Information  Goals Addressed            This Visit's Progress     Patient Stated   . "I would like to sleep better" (pt-stated)       "I still don't sleep well but nothing helps so I've just gotten use to it."  Sleep disturbance in patient with bipolar disorder  Current Barriers:  [name] Knowledge Deficits related to effective sleep improvement strategies . Lacks caregiver support.   Nurse Case Manager Clinical Goal(s):  [name] Over the next 30 days, patient will work with Dr Lajuana Ripple to address needs related to insomnia  Interventions:  . Evaluation of current treatment plan related to insomnia and patient's adherence to plan as established by provider. . Reviewed medications with patient and discussed remeron and trazadone o Doesn't feel that they help . Advised patient to take medication as directed and practice good sleep hygiene . Discussed nocturia o Advised to cut fluids off several hours before bed o Patient can't go back to sleep once she's gotten up to use the bathroom . Appointment scheduled with PCP for 5/25 to discuss concerns and f/u on chronic medical conditions . Discussed plans with patient for ongoing care management follow up and provided patient with direct contact information for care management team  Patient Self Care Activities:  . Performs ADL's independently . Has help with transportation and shopping   Please see past updates related to this goal by clicking on the "Past Updates" button in the selected goal      . Chronic Disease Management Needs (pt-stated)       Current Barriers:  . Chronic Disease Management support, education, and care coordination needs related to Atrial Fibrillation, CHF, HTN, and Bipolar Disorder  Clinical Goal(s) related to Atrial Fibrillation, CHF, HTN, and Bipolar Disorder:  Over the next 60 days, patient will:  . Work with the care management team to address educational, disease management, and care coordination needs   . Call provider office for new or worsened signs and symptoms . Call care management team with questions or concerns . Verbalize basic understanding of patient centered plan of care established today  Interventions related to Atrial Fibrillation, CHF, HTN, and Bipolar Disorder:  . Evaluation of current treatment plans and patient's adherence to plan as established by provider . Assessed patient understanding of disease states . Assessed patient's education and care coordination needs . Provided basic disease specific education to patient  . Collaborated with appropriate clinical care team members regarding patient needs . Appointment scheduled with Dr Lajuana Ripple for 09/27/19 for follow-up  Patient Self Care Activities related to Atrial Fibrillation, CHF, HTN, and Bipolar Disorder:  . Patient is unable to independently self-manage chronic health conditions  Please see past updates related to this goal by clicking on the "Past Updates" button in the selected goal         The patient verbalized understanding of instructions provided today and declined a print copy of patient instruction materials.   Follow-up Plan The care management team will reach out to the patient again over the next 45 days.  Next PCP appointment scheduled for: 09/27/19 with Dr Lajuana Ripple  [name], BSN, RN-BC [place] [facility] [place] [facility] Direct Dial: [phone]

## 2019-09-05 ENCOUNTER — Telehealth: Payer: Self-pay | Admitting: Cardiovascular Disease

## 2019-09-05 NOTE — Telephone Encounter (Signed)
Aloe Vera capsules may cause drop in potassium and patient already taking furosemide.  Should use with caution if taking with furosemide. I will recommend against using Aleo Vera capsules or Juice unless bale to monitor potassium 2 weeks after initiating combination.

## 2019-09-05 NOTE — Telephone Encounter (Signed)
Follow Up:      Returning your call from today. 

## 2019-09-05 NOTE — Telephone Encounter (Signed)
Returned call to patient-made aware of recommendations. She will not take the aloe.  She also states she is having to take antiacids daily.   She has the "equate" brand calcium carbonate 1000 mg.  She takes 2-3 tablets daily for her heartburn and wondering if this is okay with current medications. Advised this should be ok but would verify with pharmD with current meds and call back if any contraindication.  Patient aware.

## 2019-09-05 NOTE — Telephone Encounter (Signed)
Attempt to return call, continues to ring without answer or going to VM 

## 2019-09-05 NOTE — Telephone Encounter (Signed)
Follow up    Patient is calling to add that she is wanting to know about taking medication for acid reflux in addition to aloe vera.

## 2019-09-05 NOTE — Telephone Encounter (Signed)
Pt c/o medication issue:  1. Name of Medication: Aloe Vera Caps/Gel  2. How are you currently taking this medication (dosage and times per day)? n/a  3. Are you having a reaction (difficulty breathing--STAT)? no  4. What is your medication issue? Patient wants to know if these will be okay to take while on her heart medications.

## 2019-09-05 NOTE — Telephone Encounter (Signed)
Spoke with pharmD-ok to take calcium carbonate.  Patient is aware.

## 2019-09-06 ENCOUNTER — Telehealth: Payer: Self-pay

## 2019-09-06 ENCOUNTER — Telehealth: Payer: Self-pay | Admitting: *Deleted

## 2019-09-06 MED ORDER — MIRTAZAPINE 7.5 MG PO TABS: 7.5000 mg | ORAL_TABLET | Freq: Every day | ORAL | 0 refills | Status: DC

## 2019-09-06 NOTE — Telephone Encounter (Signed)
Patient reports that she is feeling better now but if anything changes she will call 911 and go to the emergency room.

## 2019-09-06 NOTE — Telephone Encounter (Signed)
Patient received the Pfizer Covid vaccine about 1:00 pm today and has since been feeling weak, dizzy, and had slight shortness of breath.  She would like to know what you recommend she do.  Please advise.

## 2019-09-06 NOTE — Telephone Encounter (Signed)
Returned call to patient, discussed aloe vera recommendations again with patient.   Also discussed antiacid usage again as well.   Patient verbalized understanding and will not use the aloe vera.

## 2019-09-06 NOTE — Telephone Encounter (Signed)
OV 09/27/19

## 2019-09-06 NOTE — Telephone Encounter (Signed)
Call 911, Go to ED if having SOB.

## 2019-09-06 NOTE — Telephone Encounter (Signed)
Follow Up  Patient is calling in to follow up about aloe vera gel capsules and cream. Please give patient a call back to discuss.

## 2019-09-13 ENCOUNTER — Ambulatory Visit (INDEPENDENT_AMBULATORY_CARE_PROVIDER_SITE_OTHER): Payer: Medicare Other | Admitting: Licensed Clinical Social Worker

## 2019-09-13 DIAGNOSIS — F3178 Bipolar disorder, in full remission, most recent episode mixed: Secondary | ICD-10-CM | POA: Diagnosis not present

## 2019-09-13 DIAGNOSIS — F331 Major depressive disorder, recurrent, moderate: Secondary | ICD-10-CM

## 2019-09-13 DIAGNOSIS — M797 Fibromyalgia: Secondary | ICD-10-CM

## 2019-09-13 DIAGNOSIS — I10 Essential (primary) hypertension: Secondary | ICD-10-CM | POA: Diagnosis not present

## 2019-09-13 NOTE — Chronic Care Management (AMB) (Signed)
Chronic Care Management    Clinical Social Work Follow Up Note  09/13/2019 Name: Kimberly [name] MRN: [id number] DOB: 02/01/1927  Kimberly [name] is a 86 y.o. year old female who is a primary care patient of Kimberly Norlander, DO. The CCM team was consulted for assistance with [name] .   Review of patient status, including review of consultants reports, other relevant assessments, and collaboration with appropriate care team members and the patient's provider was performed as part of comprehensive patient evaluation and provision of chronic care management services.    SDOH (Social Determinants of Health) assessments performed: Yes; risk for social isolation; risk for tobacco use; risk for depression; risk for physical inactivity    Office Visit from 12/13/2018 in [facility]  PHQ-9 Total Score  10      Outpatient Encounter Medications as of 09/13/2019  Medication Sig  . acetaminophen (TYLENOL) 325 MG tablet Take 2 tablets (650 mg total) by mouth every 6 (six) hours as needed for mild pain, fever or headache (or Fever >/= 101).  . carvedilol (COREG) 6.25 MG tablet TAKE 1 TABLET BY MOUTH TWO  TIMES DAILY WITH A MEAL  . colchicine 0.6 MG tablet Take 1 tablet (0.6 mg total) by mouth daily. (Patient not taking: Reported on 02/17/2019)  . ELIQUIS 2.5 MG TABS tablet TAKE 1 TABLET BY MOUTH  TWICE DAILY  . furosemide (LASIX) 20 MG tablet TAKE 1 TABLET BY MOUTH  DAILY  . mirtazapine (REMERON) 7.5 MG tablet Take 1 tablet (7.5 mg total) by mouth at bedtime.  . Multiple Vitamins-Minerals (PRESERVISION AREDS 2) CAPS Take 1 capsule by mouth 2 (two) times daily.   [name] omeprazole (PRILOSEC OTC) 20 MG tablet Take 1 tablet (20 mg total) by mouth daily. (Patient not taking: Reported on 02/17/2019)  . Polyethyl Glycol-Propyl Glycol (SYSTANE ULTRA) 0.4-0.3 % SOLN Place 1-2 drops into both eyes 2 (two) times daily as needed (dry eyes).   . traZODone (DESYREL) 150 MG tablet Take 1  tablet (150 mg total) by mouth at bedtime.   No facility-administered encounter medications on file as of 09/13/2019.    Goals    . Client verbalizes she wants to talk with someone about anxiety and stress about managing her health conditions (patient stated) (pt-stated)     Current Barriers:  . Transportation . Social Isolation . Mental Health Challenges in patient with Chronic Diagnoses of Hx Bipolar Disorder, Fibromyalgia, HTN,  Depression, and CKD  Clinical Social Work Clinical Goal(s):  [name] [name] will talk with client in next 30 days to discuss anxiety and stress issues about managing her health conditions.   Interventions:   Talked with client about current social work needs of client  Talked with client about social support network of client  Talked with client about relaxation techniques to help client manage stress/anxiety symptoms.   [place] client about pain issus of client  Talked with client about ambulation needs of client (uses walker to ambulate)  Talked with client about weakness about exertion (she said she paces herself for activities and takes rest breaks as needed  Talked with client about client's appointment schedule . Talked with client about sleep issues of client . Provided counseling support for client . Talked with client about transport needs of client . Talked with client about mood of client . Talked with client about client completion of ADLs . Talked with client about hand dexterity Nell said since her surgery on her left hand, she has  more difficulty holding items in her left hand)  Patient Self Care Activities:  . Takes medications as prescribed . Talks to PCP as needed about medical needs of client  . Attends medical appointments as scheduled  Plan: Client to call [name] to discuss social work needs of client Client to talk with RNCM about nursing needs of client [name] to call client in next 4 weeks to assess psychosocial needs of  client  Initial goal documentation      Follow Up Plan: [name] to call client in next 4 weeks to assess the psychosocial needs of client at that time  [name].Charday Capetillo MSW, [name] Licensed Clinical Social Worker [facility] [phone]

## 2019-09-13 NOTE — Patient Instructions (Addendum)
Licensed Clinical Social Worker Visit Information  Goals we discussed today:  Goals    . Client verbalizes she wants to talk with someone about anxiety and stress about managing her health conditions (patient stated) (pt-stated)     Current Barriers:  . Transportation . Social Isolation . Mental Health Challenges in patient with Chronic Diagnoses of Hx Bipolar Disorder, Fibromyalgia, HTN,  Depression, and CKD  Clinical Social Work Clinical Goal(s):  [name] [name] will talk with client in next 30 days to discuss anxiety and stress issues about managing her health conditions.   Interventions:  Talked with client about current social work needs of client  Talked with client about social support network of client  Talked with client about relaxation techniques to help client manage stress/anxiety symptoms.  Talked with client about pain issus of client Talked with client about ambulation needs of client (uses walker to ambulate)  Talked with client about weakness upon exertion (she said she paces herself for activities and takes rest breaks as needed ) Talked with client about client's appointment schedule Talked with client about sleep issues of client  Provided counseling support for client  Talked with client about transport needs of client  Talked with client about mood of client  Talked with client about client completion of ADLs  Talked with client about hand dexterity Hirsch said since her surgery on her left hand, she has more difficulty holding items in her left hand)  Patient Self Care Activities:  . Takes medications as prescribed . Talks to PCP as needed about medical needs of client  . Attends medical appointments as scheduled  Plan: Client to call [name] to discuss social work needs of client Client to talk with RNCM about nursing needs of client [name] to call client in next 4 weeks to assess psychosocial needs of client  Initial goal documentation      Materials Provided:  No  Follow Up Plan: [name] to call client in next 4 weeks to assess the psychosocial needs of client at that time  The patient verbalized understanding of instructions provided today and declined a print copy of patient instruction materials.   Kimberly [name] MSW, [name] Licensed Clinical Social Worker [facility] [phone]

## 2019-09-15 ENCOUNTER — Telehealth: Payer: Self-pay | Admitting: Cardiovascular Disease

## 2019-09-15 DIAGNOSIS — R6889 Other general symptoms and signs: Secondary | ICD-10-CM | POA: Diagnosis not present

## 2019-09-15 NOTE — Telephone Encounter (Signed)
LMTCB

## 2019-09-15 NOTE — Telephone Encounter (Signed)
Pt c/o swelling: STAT is pt has developed SOB within 24 hours  1) How much weight have you gained and in what time span? Has not gained weight  2) If swelling, where is the swelling located? Both ankles  3) Are you currently taking a fluid pill? yes  4) Are you currently SOB? no  5) Do you have a log of your daily weights (if so, list)? no  6) Have you gained 3 pounds in a day or 5 pounds in a week? no  7) Have you traveled recently? no   Patient states her ankles are swollen and have been for the last 2 to 3 days. She states she is not having any other symptoms. She states she has a dentist today and will be leaving around 1:15 pm and is unsure when she will get back.

## 2019-09-16 MED ORDER — FUROSEMIDE 40 MG PO TABS: 40.0000 mg | ORAL_TABLET | Freq: Every day | ORAL | 3 refills | Status: DC

## 2019-09-16 MED ORDER — POTASSIUM CHLORIDE ER 10 MEQ PO TBCR: 10.0000 meq | EXTENDED_RELEASE_TABLET | Freq: Every day | ORAL | 2 refills | Status: DC

## 2019-09-16 NOTE — Telephone Encounter (Signed)
OK then, Rx for KCl 10 mEq to take on days when furosemide increased to 40 mg daily. Please give #30 and 2 RF since we may need to do this again in the future.

## 2019-09-16 NOTE — Telephone Encounter (Signed)
Pt called and has reported that she does not have many options to get K rich foods at home. Her grocery is restricted to what her neighbors can get for her.   I will forward back to Dr. Sallyanne Kuster with this added information to be sure no K needed at this time.     ** pt wants RX for lasix sent to [facility] and to note on it FOR DELIVERY

## 2019-09-16 NOTE — Telephone Encounter (Signed)
Patient called back because her ankles are still swollen. She called because her ankles are still swollen

## 2019-09-16 NOTE — Telephone Encounter (Signed)
Pt called to report that her lower extremities have been swollen for 4 days and is up to her calves bilaterally. She says they are not painful and she has not had any added sodium in her diet. She says usually elevating them gives her improvent but it has been getting more difficult to control the last few days. She says her breathing has not worsened with the edema.   She is taking lasix 20 mg a day and is not on any added K.   Will forward to Dr. Sallyanne Kuster for review.

## 2019-09-16 NOTE — Telephone Encounter (Signed)
Pt advised and meds sent to her RX and noted for delivery.

## 2019-09-16 NOTE — Telephone Encounter (Signed)
OK to increase furosemide to 40 mg daily and eat K rich foods. Call back Monday if that does not help.

## 2019-09-18 ENCOUNTER — Encounter (HOSPITAL_COMMUNITY): Payer: Self-pay | Admitting: Emergency Medicine

## 2019-09-18 ENCOUNTER — Emergency Department (HOSPITAL_COMMUNITY): Payer: Medicare Other

## 2019-09-18 ENCOUNTER — Other Ambulatory Visit: Payer: Self-pay

## 2019-09-18 ENCOUNTER — Emergency Department (HOSPITAL_COMMUNITY)
Admission: EM | Admit: 2019-09-18 | Discharge: 2019-09-18 | Disposition: A | Discharge status: Home / Self Care | Payer: Medicare Other | Attending: Emergency Medicine | Admitting: Emergency Medicine

## 2019-09-18 DIAGNOSIS — Z79899 Other long term (current) drug therapy: Secondary | ICD-10-CM | POA: Diagnosis not present

## 2019-09-18 DIAGNOSIS — Z7901 Long term (current) use of anticoagulants: Secondary | ICD-10-CM | POA: Diagnosis not present

## 2019-09-18 DIAGNOSIS — Z950 Presence of cardiac pacemaker: Secondary | ICD-10-CM | POA: Insufficient documentation

## 2019-09-18 DIAGNOSIS — R600 Localized edema: Secondary | ICD-10-CM | POA: Diagnosis not present

## 2019-09-18 DIAGNOSIS — I130 Hypertensive heart and chronic kidney disease with heart failure and stage 1 through stage 4 chronic kidney disease, or unspecified chronic kidney disease: Secondary | ICD-10-CM | POA: Diagnosis not present

## 2019-09-18 DIAGNOSIS — J811 Chronic pulmonary edema: Secondary | ICD-10-CM | POA: Diagnosis not present

## 2019-09-18 DIAGNOSIS — M79604 Pain in right leg: Secondary | ICD-10-CM | POA: Insufficient documentation

## 2019-09-18 DIAGNOSIS — I10 Essential (primary) hypertension: Secondary | ICD-10-CM | POA: Diagnosis not present

## 2019-09-18 DIAGNOSIS — I4891 Unspecified atrial fibrillation: Secondary | ICD-10-CM | POA: Insufficient documentation

## 2019-09-18 DIAGNOSIS — R609 Edema, unspecified: Secondary | ICD-10-CM

## 2019-09-18 DIAGNOSIS — R2243 Localized swelling, mass and lump, lower limb, bilateral: Secondary | ICD-10-CM | POA: Diagnosis present

## 2019-09-18 DIAGNOSIS — N183 Chronic kidney disease, stage 3 unspecified: Secondary | ICD-10-CM | POA: Insufficient documentation

## 2019-09-18 DIAGNOSIS — I5032 Chronic diastolic (congestive) heart failure: Secondary | ICD-10-CM | POA: Diagnosis not present

## 2019-09-18 DIAGNOSIS — M79605 Pain in left leg: Secondary | ICD-10-CM | POA: Insufficient documentation

## 2019-09-18 DIAGNOSIS — J9811 Atelectasis: Secondary | ICD-10-CM | POA: Diagnosis not present

## 2019-09-18 LAB — COMPREHENSIVE METABOLIC PANEL
ALT: 13 U/L (ref 0–44)
AST: 21 U/L (ref 15–41)
Albumin: 3.9 g/dL (ref 3.5–5.0)
Alkaline Phosphatase: 54 U/L (ref 38–126)
Anion gap: 13 (ref 5–15)
BUN: 27 mg/dL — ABNORMAL HIGH (ref 8–23)
CO2: 21 mmol/L — ABNORMAL LOW (ref 22–32)
Calcium: 8.2 mg/dL — ABNORMAL LOW (ref 8.9–10.3)
Chloride: 101 mmol/L (ref 98–111)
Creatinine, Ser: 1.3 mg/dL — ABNORMAL HIGH (ref 0.44–1.00)
GFR calc Af Amer: 42 mL/min — ABNORMAL LOW (ref >60)
GFR calc non Af Amer: 36 mL/min — ABNORMAL LOW (ref >60)
Glucose, Bld: 98 mg/dL (ref 70–99)
Potassium: 3.6 mmol/L (ref 3.5–5.1)
Sodium: 135 mmol/L (ref 135–145)
Total Bilirubin: 0.5 mg/dL (ref 0.3–1.2)
Total Protein: 6.8 g/dL (ref 6.5–8.1)

## 2019-09-18 LAB — CBC
HCT: 35.4 % — ABNORMAL LOW (ref 36.0–46.0)
Hemoglobin: 11.5 g/dL — ABNORMAL LOW (ref 12.0–15.0)
MCH: 34.3 pg — ABNORMAL HIGH (ref 26.0–34.0)
MCHC: 32.5 g/dL (ref 30.0–36.0)
MCV: 105.7 fL — ABNORMAL HIGH (ref 80.0–100.0)
Platelets: 186 10*3/uL (ref 150–400)
RBC: 3.35 MIL/uL — ABNORMAL LOW (ref 3.87–5.11)
RDW: 14.3 % (ref 11.5–15.5)
WBC: 5.9 10*3/uL (ref 4.0–10.5)
nRBC: 0 % (ref 0.0–0.2)

## 2019-09-18 MED ORDER — FUROSEMIDE 10 MG/ML IJ SOLN
40.0000 mg | Freq: Once | INTRAMUSCULAR | Status: AC
  Administered 2019-09-18: 40 mg via INTRAVENOUS
  Filled 2019-09-18: qty 4

## 2019-09-18 NOTE — ED Notes (Signed)
Patient's 02 sat was 79 on room air. Patient asked if she was having some shortness of breath, patient states that she is always short of breath. RN Vivien Rota made aware. Patient is now on 2l of 02. Patient is now 100 percent.

## 2019-09-18 NOTE — ED Triage Notes (Signed)
EMS reports swelling bilateral ankles that started 4 weeks ago. Cardiology increased patient's Lasix dosage but according to patient this did not help. Patient is alert 4 and ambulatory.

## 2019-09-18 NOTE — Discharge Instructions (Addendum)
Follow-up with Dr. Loletha Grayer for the continued edema.

## 2019-09-18 NOTE — ED Notes (Signed)
Patient assisted to restroom with walker.  Patient back in bed. MD at bedside

## 2019-09-18 NOTE — ED Provider Notes (Signed)
[facility] EMERGENCY DEPARTMENT Provider Note   CSN: [id number] Arrival date & time: 09/18/19  1901     History Chief Complaint  Patient presents with  . Leg Swelling    Kimberly [name] is a 86 y.o. female.  HPI  patient presents with swelling in her legs.  Is had over the last month.  Her cardiologist has been increasing her Lasix.  Including 40 mg over the last 4 days.  No chest pain.  No trouble breathing.  No fevers.  There is pain however in both lower legs.  History of cardiomyopathy and CHF.  She has a pacemaker.  States she feels as if she has been urinating more, but states the legs not been getting smaller.    Past Medical History:  Diagnosis Date  . Acute blood loss anemia 10/11/2012  . Acute diverticulitis 08/24/2013  . Acute on chronic combined systolic and diastolic CHF, NYHA class 4 ([place]) 11/15/2013  . Antral ulcer 10/11/2012  . Arrhythmia    atrial fibb  . Atrial fibrillation ([place])   . Cardiomyopathy, nonischemic ([name])   . Chronic anticoagulation 10/12/2012  . CKD (chronic kidney disease) stage 3, GFR 30-59 ml/min 10/12/2012  . Depression   . Erosive esophagitis 10/11/2012  . Fibromyalgia   . Glaucoma   . H/O echocardiogram 2007   EF 40-45%,         . Hypertension   . Osteoarthritis   . Pacemaker    Last saw cards 07/2013  . Scoliosis     Patient Active Problem List   Diagnosis Date Noted  . Finger dislocation, initial encounter 11/16/2018  . Hand trauma, left, initial encounter 11/16/2018  . Acute blood loss anemia 10/25/2018  . Rectal bleeding 10/23/2018  . Acute GI bleeding   . Enteritis of small intestine due to enterotoxigenic Escherichia coli associated with diarrhea 03/24/2017  . Bipolar affective disorder ([place]) 03/23/2017  . Olfactory hallucinations 03/23/2017  . Lower GI bleed   . Glaucoma 02/28/2017  . Pacemaker 03/21/2015  . Non-ischemic cardiomyopathy ([place]) 11/28/2014  . PUD (peptic ulcer disease) 06/14/2014  . History of bipolar disorder     . Hypokalemia 05/26/2014  . Fall at home 05/26/2014  . Chronic diastolic heart failure ([place]) 11/15/2013  . Complete heart block ([place]) 11/15/2013  . Inability to ambulate due to ankle or foot 09/05/2013  . Unspecified constipation 08/26/2013  . Anemia 08/25/2013  . Acute diverticulitis 08/24/2013  . CKD (chronic kidney disease) stage 3, GFR 30-59 ml/min 10/12/2012  . Chronic anticoagulation 10/12/2012  . Osteoarthritis   . Permanent atrial fibrillation   . Biventricular cardiac pacemaker - [employer] Consulta   . Fibromyalgia   . Essential hypertension   . Depression     Past Surgical History:  Procedure Laterality Date  . ABDOMINAL HYSTERECTOMY    . APPENDECTOMY    . BACK SURGERY    . BIOPSY  03/03/2017   Procedure: BIOPSY;  Surgeon: Danie Binder, MD;  Location: [facility] END[facility] SU[facility];  Service: Endoscopy;;  gastric  . BREAST SURGERY    . CARDIAC CATHETERIZATION  12/08/2005   LAD AND LEFT MAIN WITH NO HIGH-GRADE STENOSIS. MILD DISEASE IN THE CX AND LAD SYSTEM. SEVERE LV DYSFUNCTION WITH DILATION OF THE LV. EF 15-20%. LV END-DIASTOLIC PRESSURE IS 90. +1 MR.  . CHOLECYSTECTOMY    . COLONOSCOPY N/A 03/03/2017   Procedure: COLONOSCOPY;  Surgeon: Danie Binder, MD;  Location: [facility] ENDO SU[facility];  Service: Endoscopy;  Laterality: N/A;  . CYSTOSCOPY  N/A 02/24/2013   Procedure: CYSTOSCOPY WITH URETHRAL DILITATION;  Surgeon: Marissa Nestle, MD;  Location: [facility] ORS;  Service: Urology;  Laterality: N/A;  . DOPPLER ECHOCARDIOGRAPHY N/A 05/30/2010   LV SIZE IS NORMAL. LV SYSTOLIC FUNCTION IS LOW NORMAL. EF=50-55%. MILD INFERIOR HYPOKINESIS.MILD TO MODERATE POSTERIOR WALL HYPOKINESIS.PACEMAKER LEAD IN THE RV. LA IS MILDLY DILATED. RA IS MODERATE TO SEVERLY DILATED. PACEMAKER LEAD IN THE RA. MILD CALCICICATION OF THE MV APPARATUS. MODERATE MR. MILD TO MODERATE TR. MILD PHTN.AV MILDLY SCLEROTIC.  [name] ESOPHAGOGASTRODUODENOSCOPY N/A 10/13/2012   Dr. Gala Romney: severe ulcerative reflux esophagitis, question of  Barrett's but negative path, single deep prepyloric antral ulcer, negative H.pylori  . ESOPHAGOGASTRODUODENOSCOPY N/A 03/03/2017   Procedure: ESOPHAGOGASTRODUODENOSCOPY (EGD);  Surgeon: Danie Binder, MD;  Location[facility];  Service: Endoscopy;  Laterality: N/A;  . HERNIA REPAIR     right inguinal hernia and umbilical  . LOWETR EXT VENOUS Bilateral 11-08-10   R & L- NO EVIDENCE OF THROMBUS OR THROMBOPHLEBITIS. THERE IS MILD AMOUNT OF SUBCUTANEOUS EDEMA NOTED WITHIN THE LEFT CALF AND ANKLE. R & L GSV AND SSV- NO VENOUS INSUFF NOTED.  [name] NECK SURGERY    . NUCLEAR STRESS TEST N/A 02/13/2009   NORMAL PATTERN OF PERFUSION IN ALL REGIONS. POST STRESS VENTICULAR SIZE IS NORMAL. POST  STESS EF 85%.  NORMAL MYOCARDIAL PERFUSION STUDY.  [name] OPEN REDUCTION INTERNAL FIXATION (ORIF) DISTAL PHALANX Left 11/16/2018   Procedure: MIDDLE FINGER OPEN REDUCTION VERSUS RECONSTRUCTION;  Surgeon: Roseanne Kaufman, MD;  Location: [facility];  Service: Orthopedics;  Laterality: Left;  . PACEMAKER INSERTION    . POLYPECTOMY  03/03/2017   Procedure: POLYPECTOMY;  Surgeon: Danie Binder, MD;  Location[facility];  Service: Endoscopy;;  colon  . TONSILLECTOMY    . YAG LASER APPLICATION Bilateral 2/83/6629   Procedure: YAG LASER APPLICATION;  Surgeon: Williams Che, MD;  Location: [facility] ORS;  Service: Ophthalmology;  Laterality: Bilateral;     OB History    Gravida  5   Para  2   Term  1   Preterm  1   AB  3   Living  2     SAB  3   TAB      Ectopic      Multiple      Live Births              Family History  Problem Relation Age of Onset  . Cancer Sister   . Asthma Sister   . Heart failure Brother   . Pulmonary embolism Brother   . Cancer Brother   . Colon cancer Neg Hx     Social History   Tobacco Use  . Smoking status: Former Smoker    Packs/day: 0.50    Years: 30.00    Pack years: 15.00    Types: Cigarettes    Quit date: 12/13/2004    Years since quitting: 14.7  .  Smokeless tobacco: Never Used  . Tobacco comment: former smoker  Substance Use Topics  . Alcohol use: Yes    Alcohol/week: 0.0 standard drinks    Comment: history of drinking a 1/2 glass of wine in the evening  . Drug use: No    Home Medications Prior to Admission medications   Medication Sig Start Date End Date Taking? Authorizing Provider  acetaminophen (TYLENOL) 325 MG tablet Take 2 tablets (650 mg total) by mouth every 6 (six) hours as needed for mild pain, fever or headache (or  Fever >/= 101). 10/17/18  Yes Emokpae, Courage, MD  carvedilol (COREG) 6.25 MG tablet TAKE 1 TABLET BY MOUTH TWO  TIMES DAILY WITH A MEAL Patient taking differently: Take 6.25 mg by mouth 2 (two) times daily with a meal.  06/29/19  Yes Croitoru, Mihai, MD  ELIQUIS 2.5 MG TABS tablet TAKE 1 TABLET BY MOUTH  TWICE DAILY 06/03/19  Yes Croitoru, Mihai, MD  furosemide (LASIX) 40 MG tablet Take 1 tablet (40 mg total) by mouth daily. 09/16/19  Yes Croitoru, Mihai, MD  Multiple Vitamins-Minerals (PRESERVISION AREDS 2) CAPS Take 1 capsule by mouth 2 (two) times daily.    Yes [provider]  Polyethyl Glycol-Propyl Glycol (SYSTANE ULTRA) 0.4-0.3 % SOLN Place 1-2 drops into both eyes 2 (two) times daily as needed (dry eyes).    Yes [provider]  potassium chloride (KLOR-CON) 10 MEQ tablet Take 1 tablet (10 mEq total) by mouth daily. Take only when you take the 40 mg of Lasix. 09/16/19  Yes Croitoru, Mihai, MD  colchicine 0.6 MG tablet Take 1 tablet (0.6 mg total) by mouth daily. Patient not taking: Reported on 02/17/2019 11/19/18   Kathie Dike, MD  mirtazapine (REMERON) 7.5 MG tablet Take 1 tablet (7.5 mg total) by mouth at bedtime. 09/06/19   [name], DO  omeprazole (PRILOSEC OTC) 20 MG tablet Take 1 tablet (20 mg total) by mouth daily. Patient not taking: Reported on 02/17/2019 10/17/18   Roxan Hockey, MD  traZODone (DESYREL) 150 MG tablet Take 1 tablet (150 mg total) by mouth at bedtime.  08/01/19   Dettinger, Fransisca Kaufmann, MD    Allergies    Ciprofloxacin, Flagyl [metronidazole], Iron, Papaya derivatives, Iodine, Penicillins, and Sulfa antibiotics  Review of Systems   Review of Systems  Constitutional: Negative for appetite change.  HENT: Negative for congestion.   Respiratory: Negative for shortness of breath.   Cardiovascular: Positive for leg swelling. Negative for chest pain.  Gastrointestinal: Negative for abdominal pain.  Genitourinary: Negative for flank pain.  Musculoskeletal: Positive for joint swelling. Negative for back pain.  Skin: Negative for rash.  Neurological: Negative for weakness.  Psychiatric/Behavioral: Negative for confusion.    Physical Exam Updated Vital Signs BP (!) 134/106   Pulse 76   Temp 97.7 F (36.5 C) (Oral)   Resp 18   Ht 5\' 6"  (1.676 m)   Wt 63.5 kg   SpO2 100%   BMI 22.60 kg/m   Physical Exam Vitals and nursing note reviewed.  HENT:     Head: Normocephalic.  Eyes:     Pupils: Pupils are equal, round, and reactive to light.  Cardiovascular:     Rate and Rhythm: Regular rhythm.  Pulmonary:     Breath sounds: No wheezing, rhonchi or rales.  Abdominal:     Tenderness: There is no abdominal tenderness.  Musculoskeletal:     Cervical back: Neck supple.     Comments: Chronic joint changes in bilateral hands.  Moderate pitting edema bilateral lower extremities without erythema or induration.  Skin:    General: Skin is warm.     Capillary Refill: Capillary refill takes less than 2 seconds.  Neurological:     Mental Status: She is alert and oriented to person, place, and time.     ED Results / Procedures / Treatments   Labs (all labs ordered are listed, but only abnormal results are displayed) Labs Reviewed  COMPREHENSIVE METABOLIC PANEL - Abnormal; Notable for the following components:  Result Value   CO2 21 (*)    BUN 27 (*)    Creatinine, Ser 1.30 (*)    Calcium 8.2 (*)    GFR calc non Af Amer 36 (*)    GFR  calc Af Amer 42 (*)    All other components within normal limits  CBC - Abnormal; Notable for the following components:   RBC 3.35 (*)    Hemoglobin 11.5 (*)    HCT 35.4 (*)    MCV 105.7 (*)    MCH 34.3 (*)    All other components within normal limits    EKG EKG Interpretation  Date/Time:  Sunday Sep 18 2019 19:26:11 EDT Ventricular Rate:  78 PR Interval:    QRS Duration: 139 QT Interval:  447 QTC Calculation: 510 R Axis:   -110 Text Interpretation: electronically paced  Right bundle branch block Confirmed by Davonna Belling [phone]) on 09/18/2019 8:16:19 PM   Radiology DG Chest Portable 1 View  Result Date: 09/18/2019 CLINICAL DATA:  Edema. Bilateral lower extremity edema for 1-2 weeks. EXAM: PORTABLE CHEST 1 VIEW COMPARISON:  Radiograph 10/09/2018, remote CT 12/31/2005 FINDINGS: Multi lead left-sided pacemaker in place. Chronic cardiomegaly unchanged. Aortic atherosclerosis. No pulmonary edema. Urine tray shin of the right hemidiaphragm. Subsegmental atelectasis at the bases. Biapical pleuroparenchymal scarring. No pneumothorax or large pleural effusion. Anti lordotic positioning limits assessment. Bones are diffusely under mineralized. Surgical hardware in the lower cervical spine. IMPRESSION: Chronic cardiomegaly without pulmonary edema. Bibasilar atelectasis. Aortic Atherosclerosis (ICD10-I70.0). Electronically Signed   By: Keith Rake M.D.   On: 09/18/2019 20:05    Procedures Procedures (including critical care time)  Medications Ordered in ED Medications  furosemide (LASIX) injection 40 mg (40 mg Intravenous Given 09/18/19 2119)    ED Course  I have reviewed the triage vital signs and the nursing notes.  Pertinent labs & imaging results that were available during my care of the patient were reviewed by me and considered in my medical decision making (see chart for details).    MDM Rules/Calculators/A&P                      Patient presents with peripheral  edema.  X-ray does not show pulmonary edema.  No real shortness of breath.  Has been adjusting Lasix as an outpatient.  Will give IV dose here.  Feels somewhat better.  Discharge home.  Follow-up with her cardiologist for further dosing Final Clinical Impression(s) / ED Diagnoses Final diagnoses:  Peripheral edema    Rx / DC Orders ED Discharge Orders    None       Davonna Belling, MD 09/18/19 2324

## 2019-09-18 NOTE — ED Notes (Signed)
Patient requesting food at this time. Spoke with Dr Alvino Chapel, who stated that it was fine for patient to eat. Heated patient a frozen meal and give ginger ale to drink. RN made aware

## 2019-09-20 ENCOUNTER — Telehealth: Payer: Self-pay | Admitting: Cardiovascular Disease

## 2019-09-20 ENCOUNTER — Telehealth: Payer: Self-pay | Admitting: Family Medicine

## 2019-09-20 NOTE — Telephone Encounter (Signed)
Please draw up dismissal letter for patient.  This behavior is inappropriate and abuse to the staff will not be tolerated.

## 2019-09-20 NOTE — Telephone Encounter (Signed)
I have not seen this patient in almost a year.  She needs an OV (needs 30 mins).  She likely is having exacerbation of her bipolar disorder.  Alternatively, I can refer to psychiatry.  Let me know what she wants to do

## 2019-09-20 NOTE — Telephone Encounter (Signed)
PATIENT AWARE, HAS APPOINTMENT SCHEDULED NEXT MONTH

## 2019-09-20 NOTE — Telephone Encounter (Signed)
Patient called back regarding our last conversation. She is angry and states she will find another doctor. Cursed at me and hung up the phone.

## 2019-09-20 NOTE — Telephone Encounter (Signed)
Pt called stating that the medicine Dr Lajuana Ripple recently prescribed pt for sleep is not working. Pt says she is still having trouble sleeping and needs something else or a stronger dosage to try.

## 2019-09-20 NOTE — Telephone Encounter (Signed)
New Message    Pt c/o swelling: STAT is pt has developed SOB within 24 hours  1) How much weight have you gained and in what time span? Pt does not weigh herself but says she has not gained weight   2) If swelling, where is the swelling located? Both ankles   3) Are you currently taking a fluid pill? Yes   4) Are you currently SOB? Yes, pt says she always is SOB   5) Do you have a log of your daily weights (if so, list)? No   6) Have you gained 3 pounds in a day or 5 pounds in a week? No   7) Have you traveled recently? No    Pt moved her appt with the PA to virtual due to no transportation   Please call

## 2019-09-20 NOTE — Telephone Encounter (Signed)
Spoke with pt and Furosemide was recently increased to 40 mg on 09/16/19 and pt has not seen any improvement Also pt notes no change in swelling when going to bed and getting up in the am Encourage pt to watch salt intake and elevate legs as much as possible.Will forward to Dr Sallyanne Kuster for review and recommendations ./cy

## 2019-09-21 ENCOUNTER — Encounter: Payer: Self-pay | Admitting: Family Medicine

## 2019-09-21 ENCOUNTER — Telehealth: Payer: Self-pay | Admitting: *Deleted

## 2019-09-21 ENCOUNTER — Telehealth: Payer: Self-pay | Admitting: Cardiovascular Disease

## 2019-09-21 NOTE — Telephone Encounter (Signed)
Pt aware of recommendations and agrees with plan and will keep upcoming appt ./cy

## 2019-09-21 NOTE — Telephone Encounter (Signed)
Dr. Loletha Grayer is out of the office until 5/31 - I am covering. Advise continue current dose lasix and follow-up with Almyra Deforest, scheduled in a few days.  Dr Debara Pickett

## 2019-09-21 NOTE — Telephone Encounter (Signed)
Patient called this morning still upset about provider response. Call transferred to office manager.

## 2019-09-21 NOTE — Telephone Encounter (Signed)
Letter done

## 2019-09-21 NOTE — Telephone Encounter (Signed)
Pt notified may use Biotene mouthwash as directed ./cy

## 2019-09-21 NOTE — Telephone Encounter (Signed)
[facility] called in and stated patient was very upset that she was unable to get her sleeping medication changed. Patient had called into the office asking for her sleeping medication to be changed. Patient was notified that she would have to be seen due to the fact she had not been seen in 10 mths. Patient got very angry and cussed at one of the nurses. Provider did dismiss patient from practice due to her inappropriate behavior to staff. I have contacted patient and she states she will call me back.

## 2019-09-21 NOTE — Telephone Encounter (Signed)
New message   Patient would like to know if she can take biotene for dry mouth. Please advise.

## 2019-09-22 ENCOUNTER — Ambulatory Visit: Payer: Medicare Other | Admitting: Physician Assistant

## 2019-09-23 ENCOUNTER — Telehealth: Payer: Self-pay

## 2019-09-23 ENCOUNTER — Telehealth: Payer: Self-pay | Admitting: *Deleted

## 2019-09-23 ENCOUNTER — Telehealth (INDEPENDENT_AMBULATORY_CARE_PROVIDER_SITE_OTHER): Payer: Medicare Other | Admitting: Physician Assistant

## 2019-09-23 ENCOUNTER — Encounter: Payer: Self-pay | Admitting: Physician Assistant

## 2019-09-23 VITALS — Ht 67.0 in | Wt 144.0 lb

## 2019-09-23 DIAGNOSIS — I428 Other cardiomyopathies: Secondary | ICD-10-CM

## 2019-09-23 DIAGNOSIS — Z79899 Other long term (current) drug therapy: Secondary | ICD-10-CM | POA: Diagnosis not present

## 2019-09-23 DIAGNOSIS — I2510 Atherosclerotic heart disease of native coronary artery without angina pectoris: Secondary | ICD-10-CM | POA: Diagnosis not present

## 2019-09-23 DIAGNOSIS — R0600 Dyspnea, unspecified: Secondary | ICD-10-CM

## 2019-09-23 DIAGNOSIS — Z950 Presence of cardiac pacemaker: Secondary | ICD-10-CM

## 2019-09-23 DIAGNOSIS — I4821 Permanent atrial fibrillation: Secondary | ICD-10-CM

## 2019-09-23 DIAGNOSIS — I5032 Chronic diastolic (congestive) heart failure: Secondary | ICD-10-CM

## 2019-09-23 DIAGNOSIS — N183 Chronic kidney disease, stage 3 unspecified: Secondary | ICD-10-CM

## 2019-09-23 DIAGNOSIS — I10 Essential (primary) hypertension: Secondary | ICD-10-CM

## 2019-09-23 NOTE — Telephone Encounter (Signed)
Left a voice message for the patient letting her know that I was calling to get her prepared for her virtual telephone visit with Almyra Deforest, PA-C that she can give our office a call and that I will be giving her a call back.

## 2019-09-23 NOTE — Telephone Encounter (Signed)
-----   Message from Jacqulynn Cadet, [place] sent at 09/23/2019 10:40 AM EDT ----- Regarding: Remote transmission 09/23/19 Good morning,  Almyra Deforest, PA-C had a virtual visit with this patient and had her do a remote transmission.  He is looking for if her device is able to tell her volume status for the past month, and if her pacemaker's battery probably is approaching end of life or end of service. I have cc'd him onto this message.  Thanks,    Ellison Carwin

## 2019-09-23 NOTE — Telephone Encounter (Signed)
Spoke with patient to assist with manual PPM transmission. Pt able to locate monitor and plug it in. Due to prolonged loading time (possible software update), advised pt I will call back in ~30min. Pt in agreement with plan and denies questions at this time.

## 2019-09-23 NOTE — Progress Notes (Signed)
Virtual Visit via Telephone Note   This visit type was conducted due to national recommendations for restrictions regarding the COVID-19 Pandemic (e.g. social distancing) in an effort to limit this patient's exposure and mitigate transmission in our community.  Due to her co-morbid illnesses, this patient is at least at moderate risk for complications without adequate follow up.  This format is felt to be most appropriate for this patient at this time.  The patient did not have access to video technology/had technical difficulties with video requiring transitioning to audio format only (telephone).  All issues noted in this document were discussed and addressed.  No physical exam could be performed with this format.  Please refer to the patient's chart for her  consent to [facility].   The patient was identified using 2 identifiers.  Date:  09/25/2019   ID:  Kimberly [name], DOB Mar 20, 1927, MRN [id number]  Patient Location: Home Provider Location: Home  PCP:  [name], DO  Cardiologist:  Sanda Klein, MD  Electrophysiologist:  None   Evaluation Performed:  Follow-Up Visit  Chief Complaint:  Leg edema  History of Present Illness:    Kimberly [name] is a 86 y.o. female with past medical history of permanent atrial fibrillation s/p AV node ablation and Medtronic CRT-P in 2012, nonobstructive CAD, NICM with improved EF after CRT-P, chronic scoliosis, chronic diastolic heart failure, hypertension, fibromyalgia, CKD stage III.  Previous cardiac catheterization in August 2007 revealed EF 15 to 20%, 1+ MR, diffuse calcification in the LAD and left left circumflex artery measuring between 30-40% without critical disease.  Last echocardiogram obtained on 11/28/2014 showed ejection fraction 60 to 65%, severe left and right atrial dilatation, PA peak pressure 39 mmHg.  Her last office visit with Dr. Sallyanne Kuster was in August 2020 at which time she was doing well however estimated  generator longevity was about 12 months.  Patient called cardiology service on 09/15/2019 complaining of increasing lower extremity edema.  Prior to that, she was on Lasix 20 mg daily.  Her Lasix was increased to 40 mg daily with recommendation of eating more potassium rich food.  She eventually went to the ED on 09/18/2019 with complaint of leg swelling.  Chest x-ray did not reveal any edema.  She was given a single dose of IV Lasix 40 mg before discharge to follow-up with cardiology service as outpatient.   Patient presents today for virtual visit.  She says she continued to have significant lower extremity edema and can barely fitting her shoe.  She is compliant with low-salt diet and try to elevate her leg.  She is also following up on her weight.  Her current weight based on home scale was 145 pounds.  Given her advanced age, I am hesitant to be too aggressive when comes to diuresis.  I recommended temporarily increase her Lasix to 40 mg twice daily for 3 days before decreasing it to 40 mg daily thereafter.  Potassium will also need to be increased to 20 mg daily.  Given her history of nonischemic cardiomyopathy, I recommend a repeat echocardiogram.  I plan to see her back in the clinic next Wednesday for physical exam and also lab work basic metabolic panel.  Furthermore, I recommend a remote transmission to check her volume status and also battery longevity.  Her battery longevity was 1 year on the last interrogation in August of last year, is probably near end-of-life by this point.  The patient does not have symptoms  concerning for COVID-19 infection (fever, chills, cough, or new shortness of breath).    Past Medical History:  Diagnosis Date  . Acute blood loss anemia 10/11/2012  . Acute diverticulitis 08/24/2013  . Acute on chronic combined systolic and diastolic CHF, NYHA class 4 ([place]) 11/15/2013  . Antral ulcer 10/11/2012  . Arrhythmia    atrial fibb  . Atrial fibrillation ([place])   .  Cardiomyopathy, nonischemic ([place])   . Chronic anticoagulation 10/12/2012  . CKD (chronic kidney disease) stage 3, GFR 30-59 ml/min 10/12/2012  . Depression   . Erosive esophagitis 10/11/2012  . Fibromyalgia   . Glaucoma   . H/O echocardiogram 2007   EF 40-45%,         . Hypertension   . Osteoarthritis   . Pacemaker    Last saw cards 07/2013  . Scoliosis    Past Surgical History:  Procedure Laterality Date  . ABDOMINAL HYSTERECTOMY    . APPENDECTOMY    . BACK SURGERY    . BIOPSY  03/03/2017   Procedure: BIOPSY;  Surgeon: Danie Binder, MD;  [facility];  Service: Endoscopy;;  gastric  . BREAST SURGERY    . CARDIAC CATHETERIZATION  12/08/2005   LAD AND LEFT MAIN WITH NO HIGH-GRADE STENOSIS. MILD DISEASE IN THE CX AND LAD SYSTEM. SEVERE LV DYSFUNCTION WITH DILATION OF THE LV. EF 15-20%. LV END-DIASTOLIC PRESSURE IS 90. +1 MR.  . CHOLECYSTECTOMY    . COLONOSCOPY N/A 03/03/2017   Procedure: COLONOSCOPY;  Surgeon: Danie Binder, MD;  [facility];  Service: Endoscopy;  Laterality: N/A;  . CYSTOSCOPY N/A 02/24/2013   Procedure: CYSTOSCOPY WITH URETHRAL DILITATION;  Surgeon: Marissa Nestle, MD;  Location: [facility] OR[facility];  Service: Urology;  Laterality: N/A;  . DOPPLER ECHOCARDIOGRAPHY N/A 05/30/2010   LV SIZE IS NORMAL. LV SYSTOLIC FUNCTION IS LOW NORMAL. EF=50-55%. MILD INFERIOR HYPOKINESIS.MILD TO MODERATE POSTERIOR WALL HYPOKINESIS.PACEMAKER LEAD IN THE RV. LA IS MILDLY DILATED. RA IS MODERATE TO SEVERLY DILATED. PACEMAKER LEAD IN THE RA. MILD CALCICICATION OF THE MV APPARATUS. MODERATE MR. MILD TO MODERATE TR. MILD PHTN.AV MILDLY SCLEROTIC.  [name] ESOPHAGOGASTRODUODENOSCOPY N/A 10/13/2012   Dr. Gala Romney: severe ulcerative reflux esophagitis, question of Barrett's but negative path, single deep prepyloric antral ulcer, negative H.pylori  . ESOPHAGOGASTRODUODENOSCOPY N/A 03/03/2017   Procedure: ESOPHAGOGASTRODUODENOSCOPY (EGD);  Surgeon: Danie Binder, MD;  [facility];  Service: Endoscopy;  Laterality: N/A;  . HERNIA REPAIR     right inguinal hernia and umbilical  . LOWETR EXT VENOUS Bilateral 11-08-10   R & L- NO EVIDENCE OF THROMBUS OR THROMBOPHLEBITIS. THERE IS MILD AMOUNT OF SUBCUTANEOUS EDEMA NOTED WITHIN THE LEFT CALF AND ANKLE. R & L GSV AND SSV- NO VENOUS INSUFF NOTED.  [name] NECK SURGERY    . NUCLEAR STRESS TEST N/A 02/13/2009   NORMAL PATTERN OF PERFUSION IN ALL REGIONS. POST STRESS VENTICULAR SIZE IS NORMAL. POST  STESS EF 85%.  NORMAL MYOCARDIAL PERFUSION STUDY.  [name] OPEN REDUCTION INTERNAL FIXATION (ORIF) DISTAL PHALANX Left 11/16/2018   Procedure: MIDDLE FINGER OPEN REDUCTION VERSUS RECONSTRUCTION;  Surgeon: Roseanne Kaufman, MD;  Location: [facility];  Service: Orthopedics;  Laterality: Left;  . PACEMAKER INSERTION    . POLYPECTOMY  03/03/2017   Procedure: POLYPECTOMY;  Surgeon: Danie Binder, MD;  [facility];  Service: Endoscopy;;  colon  . TONSILLECTOMY    . YAG LASER APPLICATION Bilateral 9/93/7169   Procedure: YAG LASER APPLICATION;  Surgeon: Debe Coder  Iona Hansen, MD;  Location: [facility];  Service: Ophthalmology;  Laterality: Bilateral;     Current Meds  Medication Sig  . acetaminophen (TYLENOL) 325 MG tablet Take 2 tablets (650 mg total) by mouth every 6 (six) hours as needed for mild pain, fever or headache (or Fever >/= 101).  . carvedilol (COREG) 6.25 MG tablet TAKE 1 TABLET BY MOUTH TWO  TIMES DAILY WITH A MEAL (Patient taking differently: Take 6.25 mg by mouth 2 (two) times daily with a meal. )  . ELIQUIS 2.5 MG TABS tablet TAKE 1 TABLET BY MOUTH  TWICE DAILY (Patient taking differently: Take 2.5 mg by mouth 2 (two) times daily. )  . furosemide (LASIX) 40 MG tablet Take 1 tablet (40 mg total) by mouth daily. (Patient taking differently: Take 20-40 mg by mouth daily. )  . mirtazapine (REMERON) 7.5 MG tablet Take 1 tablet (7.5 mg total) by mouth at bedtime. (Patient not taking: Reported on 09/24/2019)  . Multiple Vitamins-Minerals  (PRESERVISION AREDS 2) CAPS Take 1 capsule by mouth 2 (two) times daily.   . potassium chloride (KLOR-CON) 10 MEQ tablet Take 1 tablet (10 mEq total) by mouth daily. Take only when you take the 40 mg of Lasix.  [name] traZODone (DESYREL) 150 MG tablet Take 1 tablet (150 mg total) by mouth at bedtime. (Patient not taking: Reported on 09/24/2019)  . [DISCONTINUED] colchicine 0.6 MG tablet Take 1 tablet (0.6 mg total) by mouth daily.  . [DISCONTINUED] omeprazole (PRILOSEC OTC) 20 MG tablet Take 1 tablet (20 mg total) by mouth daily.  . [DISCONTINUED] Polyethyl Glycol-Propyl Glycol (SYSTANE ULTRA) 0.4-0.3 % SOLN Place 1-2 drops into both eyes 2 (two) times daily as needed (dry eyes).      Allergies:   Ciprofloxacin, Flagyl [metronidazole], Papaya derivatives, Iodine, Penicillins, and Sulfa antibiotics   Social History   Tobacco Use  . Smoking status: Former Smoker    Packs/day: 0.50    Years: 30.00    Pack years: 15.00    Types: Cigarettes    Quit date: 12/13/2004    Years since quitting: 14.7  . Smokeless tobacco: Never Used  . Tobacco comment: former smoker  Substance Use Topics  . Alcohol use: Yes    Alcohol/week: 0.0 standard drinks    Comment: history of drinking a 1/2 glass of wine in the evening  . Drug use: No     Family Hx: The patient's family history includes Asthma in her sister; Cancer in her brother and sister; Heart failure in her brother; Pulmonary embolism in her brother. There is no history of Colon cancer.  ROS:   Please see the history of present illness.     All other systems reviewed and are negative.   Prior CV studies:   The following studies were reviewed today:  Echo 11/28/2014 LV EF: 60% -  65%   -------------------------------------------------------------------  Indications:   Chest pain 786.51.   -------------------------------------------------------------------  Study Conclusions   - Left ventricle: The cavity size was normal. Wall thickness  was  normal. Systolic function was normal. The estimated ejection  fraction was in the range of 60% to 65%. Wall motion was normal;  there were no regional wall motion abnormalities.  - Aortic valve: Mildly calcified annulus. Trileaflet; mildly  thickened leaflets. Valve area (VTI): 1.59 cm^2. Valve area  (Vmax): 1.8 cm^2.  - Mitral valve: Mildly calcified annulus. Mildly thickened leaflets  . There was mild regurgitation.  - Left atrium: The atrium was severely dilated.  - Right  atrium: The atrium was severely dilated.  - Pulmonary arteries: Systolic pressure was mildly to moderately  increased. PA peak pressure: 39 mm Hg (S).  - Inferior vena cava: The vessel was dilated. The respirophasic  diameter changes were blunted (< 50%), consistent with elevated  central venous pressure.  - Technically adequate study.   Labs/Other Tests and Data Reviewed:    EKG:  An ECG dated 09/18/2019 was personally reviewed today and demonstrated:  Paced rhythm  Recent Labs: 10/26/2018: Magnesium 2.2 09/24/2019: ALT 15; BUN 39; Creatinine, Ser 1.44; Hemoglobin 11.5; Platelets 209; Potassium 3.9; Sodium 139   Recent Lipid Panel Lab Results  Component Value Date/Time   CHOL 179 03/15/2018 04:45 PM   TRIG 65 03/15/2018 04:45 PM   HDL 84 03/15/2018 04:45 PM   CHOLHDL 2.1 03/15/2018 04:45 PM   CHOLHDL 2.3 02/06/2009 04:50 AM   LDLCALC 82 03/15/2018 04:45 PM    Wt Readings from Last 3 Encounters:  09/24/19 143 lb 4.8 oz (65 kg)  09/23/19 144 lb (65.3 kg)  09/18/19 140 lb (63.5 kg)     Objective:    Vital Signs:  Ht 5\' 7"  (1.702 m)   Wt 144 lb (65.3 kg)   BMI 22.55 kg/m    VITAL SIGNS:  reviewed  ASSESSMENT & PLAN:    1. Dyspnea: Patient is complaining of worsening dyspnea and leg swelling.  I recommended increasing her potassium to 20 meq daily.  Increase her Lasix to 40 mg twice daily for 3 days before going back to the previous dose.  I will try to see her back within 7  days.  Given her prior history of nonischemic cardiomyopathy, I recommended a repeat echocardiogram, however this does not need to be done prior to the next office visit as I like to see the patient in the clinic sooner than that.  2. Permanent atrial fibrillation s/p AV nodal ablation: Pacemaker dependent.  On Eliquis  3. Nonischemic cardiomyopathy s/p CRT-D: Ejection fraction normalized on the last echocardiogram in 2016 after CRT-D therapy.  While my primary concern was that her device longevity was 1 year on the previous interrogation in August.  I would like to have her device interrogated to double check to see if she need her device replaced soon.  4. CAD: Previous cardiac catheterization showed mild nonobstructive CAD  5. Chronic diastolic heart failure: Based on her symptoms, I suspect she is mildly volume overloaded.  I instructed her to increase her Lasix to 40 mg twice daily for 3 days before going back to the previous dose.  Since her previous potassium level was borderline low, I asked her to permanently increase her potassium to 20 mEq daily  6. Hypertension: Continue on current therapy  7. CKD stage III: Plan to repeat lab work on the next follow-up.    COVID-19 Education: The signs and symptoms of COVID-19 were discussed with the patient and how to seek care for testing (follow up with PCP or arrange E-visit).  The importance of social distancing was discussed today.  Time:   Today, I have spent 20 minutes with the patient with telehealth technology discussing the above problems.     Medication Adjustments/Labs and Tests Ordered: Current medicines are reviewed at length with the patient today.  Concerns regarding medicines are outlined above.   Tests Ordered: Orders Placed This Encounter  Procedures  . Basic metabolic panel  . ECHOCARDIOGRAM COMPLETE    Medication Changes: No orders of the defined types were placed in  this encounter.   Follow Up:  Either In Person  or Virtual in 1 week(s)  Signed, Almyra Deforest, [place]  09/25/2019 9:27 PM    [place]

## 2019-09-23 NOTE — Telephone Encounter (Signed)
[employer] is sending the pt a new monitor in 7-10 business days. I told the pt the nurse will give her a call back.

## 2019-09-23 NOTE — Telephone Encounter (Signed)
Spoke with patient. She agrees to call the DC when new monitor is received. Direct number provided. Anticipate needing monthly PPM battery checks. Battery estimate as of 12/27/18 was 12 months remaining per scanned-in report.  Routed to [place]. [place], [place], and [place], [place] as Juluis Rainier.

## 2019-09-23 NOTE — Telephone Encounter (Signed)
  Patient Consent for Virtual Visit         Kimberly [name] has provided verbal consent on 09/23/2019 for a virtual visit (video or telephone).   CONSENT FOR VIRTUAL VISIT FOR:  Kimberly [name]  By participating in this virtual visit I agree to the following:  I hereby voluntarily request, consent and authorize [facility] and its employed or contracted physicians, physician assistants, nurse practitioners or other licensed health care professionals (the Practitioner), to provide me with telemedicine health care services (the "Services") as deemed necessary by the treating Practitioner. I acknowledge and consent to receive the Services by the Practitioner via telemedicine. I understand that the telemedicine visit will involve communicating with the Practitioner through live audiovisual communication technology and the disclosure of certain medical information by electronic transmission. I acknowledge that I have been given the opportunity to request an in-person assessment or other available alternative prior to the telemedicine visit and am voluntarily participating in the telemedicine visit.  I understand that I have the right to withhold or withdraw my consent to the use of telemedicine in the course of my care at any time, without affecting my right to future care or treatment, and that the Practitioner or I may terminate the telemedicine visit at any time. I understand that I have the right to inspect all information obtained and/or recorded in the course of the telemedicine visit and may receive copies of available information for a reasonable fee.  I understand that some of the potential risks of receiving the Services via telemedicine include:  [name] Delay or interruption in medical evaluation due to technological equipment failure or disruption; . Information transmitted may not be sufficient (e.g. poor resolution of images) to allow for appropriate medical decision making by the Practitioner;  and/or  . In rare instances, security protocols could fail, causing a breach of personal health information.  Furthermore, I acknowledge that it is my responsibility to provide information about my medical history, conditions and care that is complete and accurate to the best of my ability. I acknowledge that Practitioner's advice, recommendations, and/or decision may be based on factors not within their control, such as incomplete or inaccurate data provided by me or distortions of diagnostic images or specimens that may result from electronic transmissions. I understand that the practice of medicine is not an exact science and that Practitioner makes no warranties or guarantees regarding treatment outcomes. I acknowledge that a copy of this consent can be made available to me via my patient portal ([place]), or I can request a printed copy by calling the office of [facility].    I understand that my insurance will be billed for this visit.   I have read or had this consent read to me. . I understand the contents of this consent, which adequately explains the benefits and risks of the Services being provided via telemedicine.  . I have been provided ample opportunity to ask questions regarding this consent and the Services and have had my questions answered to my satisfaction. . I give my informed consent for the services to be provided through the use of telemedicine in my medical care

## 2019-09-23 NOTE — Telephone Encounter (Signed)
Called pt back. Attempted to assist with transmission. Monitor requires troubleshooting, call transferred to [facility], [place], for further assistance.

## 2019-09-23 NOTE — Telephone Encounter (Signed)
Faxed patient's AVS instructions and new Rx for Hydralazine 25 mg QID to Verdis Frederickson, RN who is the nurse at time of virtual visit.

## 2019-09-23 NOTE — Patient Instructions (Addendum)
Medication Instructions:   INCREASE Lasix to 40 mg 2 times a day for 3 days, resume 40 mg daily on day 4   TAKE Potassium 20 meq daily  *If you need a refill on your cardiac medications before your next appointment, please call your pharmacy*  Lab Work: Your physician recommends that you return for lab work on Wednesday 09/28/2019:   BMET  If you have labs (blood work) drawn today and your tests are completely normal, you will receive your results only by: [name] MyChart Message (if you have MyChart) OR . A paper copy in the mail If you have any lab test that is abnormal or we need to change your treatment, we will call you to review the results.  Testing/Procedures: Your physician has requested that you have an echocardiogram. Echocardiography is a painless test that uses sound waves to create images of your heart. It provides your doctor with information about the size and shape of your heart and how well your heart's chambers and valves are working. This procedure takes approximately one hour. There are no restrictions for this procedure. This test is done at [address]   Please schedule for 1-2 weeks   Follow-Up: At [facility], you and your health needs are our priority.  As part of our continuing mission to provide you with exceptional heart care, we have created designated Provider Care Teams.  These Care Teams include your primary Cardiologist (physician) and Advanced Practice Providers (APPs -  Physician Assistants and Nurse Practitioners) who all work together to provide you with the care you need, when you need it.  We recommend signing up for the patient portal called "MyChart".  Sign up information is provided on this After Visit Summary.  MyChart is used to connect with patients for Virtual Visits (Telemedicine).  Patients are able to view lab/test results, encounter notes, upcoming appointments, etc.  Non-urgent messages can be sent to your provider as well.    To learn more about what you can do with MyChart, go to [email or web address].    Your next appointment:   5 day(s)  The format for your next appointment:   In Person  Provider:   Almyra Deforest, PA-C  Other Instructions

## 2019-09-24 ENCOUNTER — Other Ambulatory Visit: Payer: Self-pay

## 2019-09-24 ENCOUNTER — Emergency Department (HOSPITAL_COMMUNITY): Payer: Medicare Other

## 2019-09-24 ENCOUNTER — Encounter (HOSPITAL_COMMUNITY): Payer: Self-pay

## 2019-09-24 ENCOUNTER — Emergency Department (HOSPITAL_COMMUNITY)
Admission: EM | Admit: 2019-09-24 | Discharge: 2019-09-25 | Disposition: A | Discharge status: Home / Self Care | Payer: Medicare Other | Attending: Emergency Medicine | Admitting: Emergency Medicine

## 2019-09-24 DIAGNOSIS — M797 Fibromyalgia: Secondary | ICD-10-CM | POA: Diagnosis not present

## 2019-09-24 DIAGNOSIS — Z743 Need for continuous supervision: Secondary | ICD-10-CM | POA: Diagnosis not present

## 2019-09-24 DIAGNOSIS — R110 Nausea: Secondary | ICD-10-CM | POA: Insufficient documentation

## 2019-09-24 DIAGNOSIS — N183 Chronic kidney disease, stage 3 unspecified: Secondary | ICD-10-CM | POA: Diagnosis not present

## 2019-09-24 DIAGNOSIS — Z7901 Long term (current) use of anticoagulants: Secondary | ICD-10-CM | POA: Diagnosis not present

## 2019-09-24 DIAGNOSIS — Z79899 Other long term (current) drug therapy: Secondary | ICD-10-CM | POA: Diagnosis not present

## 2019-09-24 DIAGNOSIS — I5032 Chronic diastolic (congestive) heart failure: Secondary | ICD-10-CM | POA: Insufficient documentation

## 2019-09-24 DIAGNOSIS — R109 Unspecified abdominal pain: Secondary | ICD-10-CM

## 2019-09-24 DIAGNOSIS — R1031 Right lower quadrant pain: Secondary | ICD-10-CM | POA: Insufficient documentation

## 2019-09-24 DIAGNOSIS — K5730 Diverticulosis of large intestine without perforation or abscess without bleeding: Secondary | ICD-10-CM | POA: Diagnosis not present

## 2019-09-24 DIAGNOSIS — I130 Hypertensive heart and chronic kidney disease with heart failure and stage 1 through stage 4 chronic kidney disease, or unspecified chronic kidney disease: Secondary | ICD-10-CM | POA: Insufficient documentation

## 2019-09-24 DIAGNOSIS — N200 Calculus of kidney: Secondary | ICD-10-CM | POA: Diagnosis not present

## 2019-09-24 DIAGNOSIS — I10 Essential (primary) hypertension: Secondary | ICD-10-CM | POA: Diagnosis not present

## 2019-09-24 DIAGNOSIS — R52 Pain, unspecified: Secondary | ICD-10-CM | POA: Diagnosis not present

## 2019-09-24 LAB — LIPASE, BLOOD: Lipase: 55 U/L — ABNORMAL HIGH (ref 11–51)

## 2019-09-24 LAB — URINALYSIS, ROUTINE W REFLEX MICROSCOPIC
Bilirubin Urine: NEGATIVE
Glucose, UA: NEGATIVE mg/dL
Hgb urine dipstick: NEGATIVE
Ketones, ur: NEGATIVE mg/dL
Nitrite: NEGATIVE
Protein, ur: NEGATIVE mg/dL
Specific Gravity, Urine: 1.009 (ref 1.005–1.030)
pH: 6 (ref 5.0–8.0)

## 2019-09-24 LAB — CBC
HCT: 35.2 % — ABNORMAL LOW (ref 36.0–46.0)
Hemoglobin: 11.5 g/dL — ABNORMAL LOW (ref 12.0–15.0)
MCH: 34.3 pg — ABNORMAL HIGH (ref 26.0–34.0)
MCHC: 32.7 g/dL (ref 30.0–36.0)
MCV: 105.1 fL — ABNORMAL HIGH (ref 80.0–100.0)
Platelets: 209 10*3/uL (ref 150–400)
RBC: 3.35 MIL/uL — ABNORMAL LOW (ref 3.87–5.11)
RDW: 14.1 % (ref 11.5–15.5)
WBC: 6.6 10*3/uL (ref 4.0–10.5)
nRBC: 0 % (ref 0.0–0.2)

## 2019-09-24 LAB — COMPREHENSIVE METABOLIC PANEL
ALT: 15 U/L (ref 0–44)
AST: 22 U/L (ref 15–41)
Albumin: 3.8 g/dL (ref 3.5–5.0)
Alkaline Phosphatase: 58 U/L (ref 38–126)
Anion gap: 13 (ref 5–15)
BUN: 39 mg/dL — ABNORMAL HIGH (ref 8–23)
CO2: 22 mmol/L (ref 22–32)
Calcium: 9 mg/dL (ref 8.9–10.3)
Chloride: 104 mmol/L (ref 98–111)
Creatinine, Ser: 1.44 mg/dL — ABNORMAL HIGH (ref 0.44–1.00)
GFR calc Af Amer: 37 mL/min — ABNORMAL LOW (ref >60)
GFR calc non Af Amer: 32 mL/min — ABNORMAL LOW (ref >60)
Glucose, Bld: 98 mg/dL (ref 70–99)
Potassium: 3.9 mmol/L (ref 3.5–5.1)
Sodium: 139 mmol/L (ref 135–145)
Total Bilirubin: 0.7 mg/dL (ref 0.3–1.2)
Total Protein: 7 g/dL (ref 6.5–8.1)

## 2019-09-24 NOTE — ED Provider Notes (Signed)
[facility] EMERGENCY DEPARTMENT Provider Note   CSN: [id number] Arrival date & time: 09/24/19  1825     History Chief Complaint  Patient presents with   Abdominal Pain    Kimberly [name] is a 86 y.o. female with past medical history significant for anemia, diverticulitis, A. fib, nonischemic cardiomyopathy, fibromyalgia, hypertension, CKD stage III presents to emergency department today with chief complaint of right-sided abdominal pain x2 years.  Patient states the pain is constant.  She describes the pain as sharp, aching, cramping, burning sensations.  She states the pain is worse with movement, especially laying on her left side.  She has associated nausea without emesis.  She has tried taking Tylenol for pain without any symptom improvement.  She rates the pain 10 out of 10 in severity.  She states she has been told by doctors in the past that she has scar tissue on her right side that causes her pain however she does not feel this is the cause.  Her last bowel movement was yesterday and it was normal for her.  She denies any history of constipation.  Abdominal surgical history includes abdominal hysterectomy, appendectomy.  She denies any fever, chills, weight loss, fatigue, chest pain, shortness of breath, gross hematuria, urinary frequency, dysuria, blood in stool, diarrhea, pelvic pain, vaginal discharge, abnormal vaginal bleeding.  Patient is not anticoagulated.    Past Medical History:  Diagnosis Date   Acute blood loss anemia 10/11/2012   Acute diverticulitis 08/24/2013   Acute on chronic combined systolic and diastolic CHF, NYHA class 4 (HCC) 11/15/2013   Antral ulcer 10/11/2012   Arrhythmia    atrial fibb   Atrial fibrillation (HCC)    Cardiomyopathy, nonischemic (HCC)    Chronic anticoagulation 10/12/2012   CKD (chronic kidney disease) stage 3, GFR 30-59 ml/min 10/12/2012   Depression    Erosive esophagitis 10/11/2012   Fibromyalgia    Glaucoma    H/O  echocardiogram 2007   EF 40-45%,          Hypertension    Osteoarthritis    Pacemaker    Last saw cards 07/2013   Scoliosis     Patient Active Problem List   Diagnosis Date Noted   Finger dislocation, initial encounter 11/16/2018   Hand trauma, left, initial encounter 11/16/2018   Acute blood loss anemia 10/25/2018   Rectal bleeding 10/23/2018   Acute GI bleeding    Enteritis of small intestine due to enterotoxigenic Escherichia coli associated with diarrhea 03/24/2017   Bipolar affective disorder ([place]) 03/23/2017   Olfactory hallucinations 03/23/2017   Lower GI bleed    Glaucoma 02/28/2017   Pacemaker 03/21/2015   Non-ischemic cardiomyopathy ([place]) 11/28/2014   PUD (peptic ulcer disease) 06/14/2014   History of bipolar disorder    Hypokalemia 05/26/2014   Fall at home 05/26/2014   Chronic diastolic heart failure ([place]) 11/15/2013   Complete heart block ([place]) 11/15/2013   Inability to ambulate due to ankle or foot 09/05/2013   Unspecified constipation 08/26/2013   Anemia 08/25/2013   Acute diverticulitis 08/24/2013   CKD (chronic kidney disease) stage 3, GFR 30-59 ml/min 10/12/2012   Chronic anticoagulation 10/12/2012   Osteoarthritis    Permanent atrial fibrillation    Biventricular cardiac pacemaker - [employer] Consult[employer]    Fibromyalgia    Essential hypertension    Depression     Past Surgical History:  Procedure Laterality Date   ABDOMINAL HYSTERECTOMY     APPENDECTOMY     BACK SURGERY  BIOPSY  03/03/2017   Procedure: BIOPSY;  Surgeon: Danie Binder, MD;  [facility];  Service: Endoscopy;;  gastric   BREAST SURGERY     CARDIAC CATHETERIZATION  12/08/2005   LAD AND LEFT MAIN WITH NO HIGH-GRADE STENOSIS. MILD DISEASE IN THE CX AND LAD SYSTEM. SEVERE LV DYSFUNCTION WITH DILATION OF THE LV. EF 15-20%. LV END-DIASTOLIC PRESSURE IS 90. +1 MR.   CHOLECYSTECTOMY     COLONOSCOPY N/A 03/03/2017   Procedure:  COLONOSCOPY;  Surgeon: Danie Binder, MD;  Location[facility];  Service: Endoscopy;  Laterality: N/A;   CYSTOSCOPY N/A 02/24/2013   Procedure: CYSTOSCOPY WITH URETHRAL DILITATION;  Surgeon: Marissa Nestle, MD;  Location: [facility];  Service: Urology;  Laterality: N/A;   DOPPLER ECHOCARDIOGRAPHY N/A 05/30/2010   LV SIZE IS NORMAL. LV SYSTOLIC FUNCTION IS LOW NORMAL. EF=50-55%. MILD INFERIOR HYPOKINESIS.MILD TO MODERATE POSTERIOR WALL HYPOKINESIS.PACEMAKER LEAD IN THE RV. LA IS MILDLY DILATED. RA IS MODERATE TO SEVERLY DILATED. PACEMAKER LEAD IN THE RA. MILD CALCICICATION OF THE MV APPARATUS. MODERATE MR. MILD TO MODERATE TR. MILD PHTN.AV MILDLY SCLEROTIC.   ESOPHAGOGASTRODUODENOSCOPY N/A 10/13/2012   Dr. Gala Romney: severe ulcerative reflux esophagitis, question of Barrett's but negative path, single deep prepyloric antral ulcer, negative H.pylori   ESOPHAGOGASTRODUODENOSCOPY N/A 03/03/2017   Procedure: ESOPHAGOGASTRODUODENOSCOPY (EGD);  Surgeon: Danie Binder, MD;  [facility];  Service: Endoscopy;  Laterality: N/A;   HERNIA REPAIR     right inguinal hernia and umbilical   LOWETR EXT VENOUS Bilateral 11-08-10   R & L- NO EVIDENCE OF THROMBUS OR THROMBOPHLEBITIS. THERE IS MILD AMOUNT OF SUBCUTANEOUS EDEMA NOTED WITHIN THE LEFT CALF AND ANKLE. R & L GSV AND SSV- NO VENOUS INSUFF NOTED.   NECK SURGERY     NUCLEAR STRESS TEST N/A 02/13/2009   NORMAL PATTERN OF PERFUSION IN ALL REGIONS. POST STRESS VENTICULAR SIZE IS NORMAL. POST  STESS EF 85%.  NORMAL MYOCARDIAL PERFUSION STUDY.   OPEN REDUCTION INTERNAL FIXATION (ORIF) DISTAL PHALANX Left 11/16/2018   Procedure: MIDDLE FINGER OPEN REDUCTION VERSUS RECONSTRUCTION;  Surgeon: Roseanne Kaufman, MD;  Location: [place];  Service: Orthopedics;  Laterality: Left;   PACEMAKER INSERTION     POLYPECTOMY  03/03/2017   Procedure: POLYPECTOMY;  Surgeon: Danie Binder, MD;  [facility];  Service: Endoscopy;;  colon    TONSILLECTOMY     YAG LASER APPLICATION Bilateral 5/62/1308   Procedure: YAG LASER APPLICATION;  Surgeon: Williams Che, MD;  Location: [facility];  Service: Ophthalmology;  Laterality: Bilateral;     OB History    Gravida  5   Para  2   Term  1   Preterm  1   AB  3   Living  2     SAB  3   TAB      Ectopic      Multiple      Live Births              Family History  Problem Relation Age of Onset   Cancer Sister    Asthma Sister    Heart failure Brother    Pulmonary embolism Brother    Cancer Brother    Colon cancer Neg Hx     Social History   Tobacco Use   Smoking status: Former Smoker    Packs/day: 0.50    Years: 30.00    Pack years: 15.00    Types: Cigarettes    Quit  date: 12/13/2004    Years since quitting: 14.7   Smokeless tobacco: Never Used   Tobacco comment: former smoker  Substance Use Topics   Alcohol use: Yes    Alcohol/week: 0.0 standard drinks    Comment: history of drinking a 1/2 glass of wine in the evening   Drug use: No    Home Medications Prior to Admission medications   Medication Sig Start Date End Date Taking? Authorizing Provider  acetaminophen (TYLENOL) 325 MG tablet Take 2 tablets (650 mg total) by mouth every 6 (six) hours as needed for mild pain, fever or headache (or Fever >/= 101). 10/17/18  Yes Emokpae, Courage, MD  calcium carbonate (TUMS - DOSED IN MG ELEMENTAL CALCIUM) 500 MG chewable tablet Chew 1 tablet by mouth daily.   Yes [provider]  carvedilol (COREG) 6.25 MG tablet TAKE 1 TABLET BY MOUTH TWO  TIMES DAILY WITH A MEAL Patient taking differently: Take 6.25 mg by mouth 2 (two) times daily with a meal.  06/29/19  Yes Croitoru, Mihai, MD  ELIQUIS 2.5 MG TABS tablet TAKE 1 TABLET BY MOUTH  TWICE DAILY Patient taking differently: Take 2.5 mg by mouth 2 (two) times daily.  06/03/19  Yes Croitoru, Mihai, MD  furosemide (LASIX) 40 MG tablet Take 1 tablet (40 mg total) by mouth daily. Patient  taking differently: Take 20-40 mg by mouth daily.  09/16/19  Yes Croitoru, Mihai, MD  Multiple Vitamins-Minerals (PRESERVISION AREDS 2) CAPS Take 1 capsule by mouth 2 (two) times daily.    Yes [provider]  Polyethylene Glycol 400 (BLINK TEARS) 0.25 % SOLN Apply 1 drop to eye daily as needed (for dry eye relief).   Yes [provider]  potassium chloride (KLOR-CON) 10 MEQ tablet Take 1 tablet (10 mEq total) by mouth daily. Take only when you take the 40 mg of Lasix. 09/16/19  Yes Croitoru, Mihai, MD  mirtazapine (REMERON) 7.5 MG tablet Take 1 tablet (7.5 mg total) by mouth at bedtime. Patient not taking: Reported on 09/24/2019 09/06/19   [name], DO  traZODone (DESYREL) 150 MG tablet Take 1 tablet (150 mg total) by mouth at bedtime. Patient not taking: Reported on 09/24/2019 08/01/19   Dettinger, Fransisca Kaufmann, MD    Allergies    Ciprofloxacin, Flagyl [metronidazole], Papaya derivatives, Iodine, Penicillins, and Sulfa antibiotics  Review of Systems   Review of Systems All other systems are reviewed and are negative for acute change except as noted in the HPI.  Physical Exam Updated Vital Signs BP (!) 150/75 (BP Location: Left Arm)    Pulse 70    Temp 97.7 F (36.5 C) (Oral)    Resp 18    Ht 5\' 7"  (1.702 m)    Wt 65 kg    SpO2 97%    BMI 22.44 kg/m   Physical Exam Vitals and nursing note reviewed.  Constitutional:      General: She is not in acute distress.    Appearance: She is not ill-appearing.  HENT:     Head: Normocephalic and atraumatic.     Right Ear: Tympanic membrane and external ear normal.     Left Ear: Tympanic membrane and external ear normal.     Nose: Nose normal.     Mouth/Throat:     Mouth: Mucous membranes are moist.     Pharynx: Oropharynx is clear.  Eyes:     General: No scleral icterus.       Right eye: No discharge.  Left eye: No discharge.     Extraocular Movements: Extraocular movements intact.     Conjunctiva/sclera:  Conjunctivae normal.     Pupils: Pupils are equal, round, and reactive to light.  Neck:     Vascular: No JVD.  Cardiovascular:     Rate and Rhythm: Normal rate and regular rhythm.     Pulses: Normal pulses.          Radial pulses are 2+ on the right side and 2+ on the left side.     Heart sounds: Normal heart sounds.  Pulmonary:     Comments: Lungs clear to auscultation in all fields. Symmetric chest rise. No wheezing, rales, or rhonchi. Abdominal:     General: Bowel sounds are normal.     Tenderness: There is abdominal tenderness in the right upper quadrant and right lower quadrant. There is no right CVA tenderness or left CVA tenderness.     Comments: Abdomen is soft, non-distended. No rigidity, no guarding. No peritoneal signs.  Musculoskeletal:        General: Normal range of motion.     Cervical back: Normal range of motion.  Skin:    General: Skin is warm and dry.     Capillary Refill: Capillary refill takes less than 2 seconds.  Neurological:     Mental Status: She is oriented to person, place, and time.     GCS: GCS eye subscore is 4. GCS verbal subscore is 5. GCS motor subscore is 6.     Comments: Fluent speech, no facial droop.  Psychiatric:        Behavior: Behavior normal.     ED Results / Procedures / Treatments   Labs (all labs ordered are listed, but only abnormal results are displayed) Labs Reviewed  CBC - Abnormal; Notable for the following components:      Result Value   RBC 3.35 (*)    Hemoglobin 11.5 (*)    HCT 35.2 (*)    MCV 105.1 (*)    MCH 34.3 (*)    All other components within normal limits  URINALYSIS, ROUTINE W REFLEX MICROSCOPIC - Abnormal; Notable for the following components:   Color, Urine STRAW (*)    Leukocytes,Ua SMALL (*)    Bacteria, UA RARE (*)    All other components within normal limits  COMPREHENSIVE METABOLIC PANEL - Abnormal; Notable for the following components:   BUN 39 (*)    Creatinine, Ser 1.44 (*)    GFR calc non Af  Amer 32 (*)    GFR calc Af Amer 37 (*)    All other components within normal limits  LIPASE, BLOOD - Abnormal; Notable for the following components:   Lipase 55 (*)    All other components within normal limits    EKG None  Radiology CT ABDOMEN PELVIS WO CONTRAST  Result Date: 09/24/2019 CLINICAL DATA:  Sharp abdominal pain, intermittent to the umbilical and right lower quadrant, also concern for pancreatitis EXAM: CT ABDOMEN AND PELVIS WITHOUT CONTRAST TECHNIQUE: Multidetector CT imaging of the abdomen and pelvis was performed following the standard protocol without IV contrast. COMPARISON:  CT 10/23/2018 FINDINGS: Lower chest: Basilar atelectatic changes in the otherwise clear lung bases. Emphysematous changes in the lungs. Borderline cardiomegaly with predominantly biatrial enlargement. Few foci of gas in the right atrium likely related to intravenous access. Pacer and pacer/defibrillator lead seen at the cardiac apex. Additional pacer lead coronary sinus. Extensive coronary artery calcifications. No pericardial effusion. Hepatobiliary: Stable appearance of cysts  within the left lobe liver. No new concerning hepatic lesion on this noncontrast CT examination. Smooth liver surface contour. Patient is post cholecystectomy. Mild prominence of the biliary tree likely related reflecting a combination of senescent change and post cholecystectomy reservoir effect. No visible intraductal gall stones. Pancreas: Mild pancreatic atrophy. No visible lesion. No peripancreatic inflammation or ductal dilatation. Spleen: Normal splenic size. Few splenic vascular calcifications. No concerning splenic lesion. Adrenals/Urinary Tract: Normal adrenal glands. Kidneys appear symmetric in size. Stable mild bilateral symmetric perinephric stranding, a nonspecific finding though may correlate with either age or decreased renal function. No visible or contour deforming renal lesions. Nonobstructing calculi in the lower pole  right kidney. No obstructive urolithiasis or hydronephrosis. Urinary bladder is unremarkable. Stomach/Bowel: Moderate hiatal hernia containing portion of the proximal stomach. Distal stomach and duodenum are unremarkable. No small bowel thickening or dilatation. Some fecalized distal small bowel contents without evidence of obstruction. The appendix is surgically absent. No colonic dilatation or wall thickening. Scattered colonic diverticula without focal pericolonic inflammation to suggest diverticulitis. Vascular/Lymphatic: Tortuosity of the abdominal aorta. Atherosclerotic calcifications throughout the abdominal aorta and branch vessels. No aneurysm or ectasia. No enlarged abdominopelvic lymph nodes. Reproductive: Uterus is surgically absent. No concerning adnexal lesions. Other: Small fact umbilical hernia. Bilateral fat containing inguinal hernias. No bowel containing hernia. Mild abdominal wall laxity. No abdominopelvic free air or fluid. Musculoskeletal: Severe dextrocurvature of the thoracolumbar junction with associated chest wall deformity, unchanged from comparison. Stable superior endplate compression deformity at T12. Partial fusion of the L5-S1 level. Severe multilevel discogenic and facet degenerative changes throughout the included spine. Additional degenerative changes in the SI joints and hips as well as pubic symphysis. The osseous structures appear diffusely demineralized which may limit detection of small or nondisplaced fractures. IMPRESSION: 1. No CT evidence of acute intra-abdominal process to account for the patient's abdominal pain. Specifically, no CT evidence of acute pancreatitis. 2. Moderate hiatal hernia containing portion of the proximal stomach. 3. Colonic diverticulosis without evidence of diverticulitis. 4. Nonobstructing right nephrolithiasis. 5. Severe dextroscoliosis of the thoracic spine. Multilevel degenerative changes throughout the spine and pelvis. 6. Prior cholecystectomy,  appendectomy, hysterectomy. 7. Aortic Atherosclerosis (ICD10-I70.0). Electronically Signed   By: Lovena Le M.D.   On: 09/24/2019 22:18    Procedures Procedures (including critical care time)  Medications Ordered in ED Medications - No data to display  ED Course  I have reviewed the triage vital signs and the nursing notes.  Pertinent labs & imaging results that were available during my care of the patient were reviewed by me and considered in my medical decision making (see chart for details).    MDM Rules/Calculators/A&P                      History provided by patient with additional history obtained from chart review.    Patient presents to the ED with complaints of abdominal pain. Patient nontoxic appearing, in no apparent distress, vitals WNL . On exam patient tender to right upper and lower quadrants, no peritoneal signs. Will evaluate with labs and CT abdomen pelvis without contrast given her history of allergy to iodine.  Chart review shows most of patient's scans have all been performed without contrast.  Labs show no leukocytosis, hemoglobin 11.5 consistent with her baseline and known anemia diagnosis, no severe electrolyte derangement, BUN/creatinine is elevated at 39/1.44 this is up slightly compared to lab work from 6 days ago when it was 27/1.30.  Patient does have  known CKD stage III.  GFR is 32 today.  Lipase is elevated at 55.  UA without infection.  CT abdomen pelvis shows no acute abnormalities. No pancreatitis. Diverticulosis without diverticulitis. She does have moderate hiatal hernia containing portion of the proximal stomach. Patient updated on results. She is tolerating PO intake here in the ED. She has continued to deny need for analgesics or anti-emetics. With serial negative abdominal exams, unlikely acute surgical abdomen. Will recommend she follow up outpatient with GI for hernia, provided with information for on call GI provider.   The patient appears  reasonably screened and/or stabilized for discharge and I doubt any other medical condition or other [facility] requiring further screening, evaluation, or treatment in the ED at this time prior to discharge. The patient is safe for discharge with strict return precautions discussed. Recommend pcp follow up also. The patient was discussed with and seen by Dr. Roderic Palau who agrees with the treatment plan.   Portions of this note were generated with [employer]. Dictation errors may occur despite best attempts at proofreading.      Final Clinical Impression(s) / ED Diagnoses Final diagnoses:  Abdominal pain, unspecified abdominal location    Rx / DC Orders ED Discharge Orders    None       Cherre Robins, PA-C 09/25/19 [id number]    Milton Ferguson, MD 09/25/19 1529

## 2019-09-24 NOTE — Discharge Instructions (Addendum)
You have been seen today for abdominal pain. Please read and follow all provided instructions. Return to the emergency room for worsening condition or new concerning symptoms.    1. Medications:  Continue usual home medications Take medications as prescribed. Please review all of the medicines and only take them if you do not have an allergy to them.   2. Treatment: rest, drink plenty of fluids  3. Follow Up:  Please follow up with gastrointestinal doctor Dr. Laural Golden. Call his office to schedule follow up appointment.   It is also a possibility that you have an allergic reaction to any of the medicines that you have been prescribed - Everybody reacts differently to medications and while MOST people have no trouble with most medicines, you may have a reaction such as nausea, vomiting, rash, swelling, shortness of breath. If this is the case, please stop taking the medicine immediately and contact your physician.  ?

## 2019-09-24 NOTE — ED Triage Notes (Signed)
Pt c/o r sided pain x 2 years.  Reports nausea, no v/d.  LBM was yesterday and was normal per pt.

## 2019-09-25 ENCOUNTER — Encounter: Payer: Self-pay | Admitting: Physician Assistant

## 2019-09-26 ENCOUNTER — Telehealth: Payer: Self-pay | Admitting: Cardiovascular Disease

## 2019-09-26 NOTE — Telephone Encounter (Signed)
Thank you!  Kimberly [name]

## 2019-09-26 NOTE — Telephone Encounter (Signed)
Returned the call to the patient. She stated that her shortness of breath and edema were much better. She was calling for instructions for her Furosemide.  She has been advised that per her last office visit on 09/23/19: Patient is complaining of worsening dyspnea and leg swelling.  I recommended increasing her potassium to 20 meq daily.  Increase her Lasix to 40 mg twice daily for 3 days before going back to the previous dose.  I will try to see her back within 7 days.   She stated that she will go back to Furosemide 20 mg once daily. She has been advised to keep her appointment on 5/26 with Almyra Deforest, PA at 3:15pm for further assessment.

## 2019-09-26 NOTE — Telephone Encounter (Signed)
Pt c/o medication issue:  1. Name of Medication: furosemide (LASIX) 40 MG tablet and potassium chloride (KLOR-CON) 10 MEQ tablet  2. How are you currently taking this medication (dosage and times per day)? As directed  3. Are you having a reaction (difficulty breathing--STAT)? no  4. What is your medication issue? Patient states that she finally has ankles. She wants to know if she should continue taking the 40mg  of lasix and potassium. Please advise.

## 2019-09-27 ENCOUNTER — Ambulatory Visit: Payer: Medicare Other | Admitting: Family Medicine

## 2019-09-28 ENCOUNTER — Telehealth: Payer: Self-pay | Admitting: Cardiovascular Disease

## 2019-09-28 ENCOUNTER — Ambulatory Visit: Payer: Self-pay | Admitting: Licensed Clinical Social Worker

## 2019-09-28 ENCOUNTER — Encounter: Payer: Medicare Other | Admitting: Physician Assistant

## 2019-09-28 ENCOUNTER — Ambulatory Visit (INDEPENDENT_AMBULATORY_CARE_PROVIDER_SITE_OTHER): Payer: Medicare Other | Admitting: *Deleted

## 2019-09-28 DIAGNOSIS — I428 Other cardiomyopathies: Secondary | ICD-10-CM | POA: Diagnosis not present

## 2019-09-28 DIAGNOSIS — K219 Gastro-esophageal reflux disease without esophagitis: Secondary | ICD-10-CM

## 2019-09-28 DIAGNOSIS — M797 Fibromyalgia: Secondary | ICD-10-CM

## 2019-09-28 DIAGNOSIS — F331 Major depressive disorder, recurrent, moderate: Secondary | ICD-10-CM

## 2019-09-28 DIAGNOSIS — F3178 Bipolar disorder, in full remission, most recent episode mixed: Secondary | ICD-10-CM

## 2019-09-28 NOTE — Chronic Care Management (AMB) (Signed)
  Chronic Care [name]    Clinical Social Work Follow Up Note  09/28/2019 Name: Kimberly [name] [name] MRN: [id number] DOB: 25-Jan-1927  Kimberly [name] [name] is a 86 y.o. year old female who is a primary care patient of Kimberly Norlander, DO. The CCM team was consulted for assistance with Kimberly [name] [name] .   Review of patient status, including review of consultants reports, other relevant assessments, and collaboration with appropriate care team members and the patient's provider was performed as part of comprehensive patient evaluation and provision of chronic care [name] services.    SDOH (Social Determinants of Health) assessments performed: No   Outpatient Encounter Medications as of 09/28/2019  Medication Sig  . acetaminophen (TYLENOL) 325 MG tablet Take 2 tablets (650 mg total) by mouth every 6 (six) hours as needed for mild pain, fever or headache (or Fever >/= 101).  . calcium carbonate (TUMS - DOSED IN MG ELEMENTAL CALCIUM) 500 MG chewable tablet Chew 1 tablet by mouth daily.  . carvedilol (COREG) 6.25 MG tablet TAKE 1 TABLET BY MOUTH TWO  TIMES DAILY WITH A MEAL (Patient taking differently: Take 6.25 mg by mouth 2 (two) times daily with a meal. )  . ELIQUIS 2.5 MG TABS tablet TAKE 1 TABLET BY MOUTH  TWICE DAILY (Patient taking differently: Take 2.5 mg by mouth 2 (two) times daily. )  . furosemide (LASIX) 40 MG tablet Take 1 tablet (40 mg total) by mouth daily. (Patient taking differently: Take 20-40 mg by mouth daily. )  . mirtazapine (REMERON) 7.5 MG tablet Take 1 tablet (7.5 mg total) by mouth at bedtime. (Patient not taking: Reported on 09/24/2019)  . Multiple Vitamins-Minerals (PRESERVISION AREDS 2) CAPS Take 1 capsule by mouth 2 (two) times daily.   . Polyethylene Glycol 400 (BLINK TEARS) 0.25 % SOLN Apply 1 drop to eye daily as needed (for dry eye relief).  . potassium chloride (KLOR-CON) 10 MEQ tablet Take 1 tablet (10 mEq total) by mouth daily. Take only when you take the 40 mg of  Lasix.  [name] traZODone (DESYREL) 150 MG tablet Take 1 tablet (150 mg total) by mouth at bedtime. (Patient not taking: Reported on 09/24/2019)   No facility-administered encounter medications on file as of 09/28/2019.   Client has been dismissed from [place] contacted Kimberly [name] today to talk about client letter received from Kimberly [name] [name]. Client said she had been dismissed from [facility] and asked Kimberly [name] if other PCP were available in area. Kimberly [name] mentioned to Kimberly [name] [name] the practice of Kimberly [name] [name] in [name], [name]. Kimberly [name] also mentioned to Kimberly [name] the practice of Kimberly [name] [name] in [name], [name]. Kimberly [name] said she would call these practices to see if either practice was taking on new clients for medical care at present. Kimberly Decamp Kimberly [name] for phone call with Kimberly [name] on 09/28/2019  Kimberly [name] [name].Kimberly [name] [name] Kimberly [name], Kimberly [name] Licensed Clinical Social Worker Kimberly [name] Kimberly [name] [name]/Kimberly [name] [name] [phone]

## 2019-09-28 NOTE — Telephone Encounter (Signed)
Patient called and made aware that OPtivol is elevated. She reports SOB with activity over the past few days and that she has increased BLE edema. She reports that she has had increased salt intake of late. Advised patient to elevate BLE and watch sodium intake. Will forward to DR Croituro.

## 2019-09-28 NOTE — Telephone Encounter (Signed)
  1. Has your device fired? No   2. Is you device beeping? No   3. Are you experiencing draining or swelling at device site? No   4. Are you calling to see if we received your device transmission? No   5. Have you passed out? No  Kimberly [name] is calling stating she received her new monitor and would like to know when to send her transmission. Please advise.     Please route to [place]

## 2019-09-28 NOTE — Patient Instructions (Addendum)
Licensed Clinical Social Worker Visit Information  Client has been dismissed from [facility] Provided: No  09/28/2019   Name: DAWNA JAKES MRN: [id number] DOB: 02-02-1927   SHONIKA KOLASINSKI is a 86 y.o. year old female who is a primary care patient of [name], DO. The CCM team was consulted for assistance with [name] .   Review of patient status, including review of consultants reports, other relevant assessments, and collaboration with appropriate care team members and the patient's provider was performed as part of comprehensive patient evaluation and provision of chronic care management services.   SDOH (Social Determinants of Health) assessments performed: No   Client contacted [name] today to talk about client letter received from [facility]. Client said she had been dismissed from [facility] and asked [name] if other PCP were available in area. [name] mentioned to [place] the practice of [name] in [place], [name]. [name] also mentioned to [place] the practice of [facility] in [place], [name]. Caia said she would call these practices to see if either practice was taking on new clients for medical care at present. Nijah thanked [name] for phone call with [name] on 09/28/2019  The patient verbalized understanding of instructions provided today and declined a print copy of patient instruction materials.   [name].Paytan Recine MSW, [name] Licensed Clinical Social Worker [facility] [phone]

## 2019-09-28 NOTE — Telephone Encounter (Signed)
I helped the pt send a manual transmission. I told her the nurse will review it and give her a call back.

## 2019-09-29 LAB — CUP PACEART REMOTE DEVICE CHECK
Battery Remaining Longevity: 7 mo
Battery Voltage: 2.85 V
Brady Statistic AP VP Percent: 0 %
Brady Statistic AP VS Percent: 0 %
Brady Statistic AS VP Percent: 98.47 %
Brady Statistic AS VS Percent: 1.53 %
Brady Statistic RA Percent Paced: 0 %
Brady Statistic RV Percent Paced: 99.24 %
Date Time Interrogation Session: 20210526222255
Implantable Lead Implant Date: 19971124
Implantable Lead Implant Date: 20070831
Implantable Lead Location: 753858
Implantable Lead Location: 753860
Implantable Lead Model: 4024
Implantable Lead Model: 4194
Implantable Pulse Generator Implant Date: 20120619
Lead Channel Impedance Value: 4047 Ohm
Lead Channel Impedance Value: 4047 Ohm
Lead Channel Impedance Value: 494 Ohm
Lead Channel Impedance Value: 513 Ohm
Lead Channel Impedance Value: 532 Ohm
Lead Channel Impedance Value: 551 Ohm
Lead Channel Impedance Value: 703 Ohm
Lead Channel Impedance Value: 760 Ohm
Lead Channel Impedance Value: 969 Ohm
Lead Channel Pacing Threshold Amplitude: 0.625 V
Lead Channel Pacing Threshold Amplitude: 0.875 V
Lead Channel Pacing Threshold Pulse Width: 0.4 ms
Lead Channel Pacing Threshold Pulse Width: 1 ms
Lead Channel Sensing Intrinsic Amplitude: 10.625 mV
Lead Channel Sensing Intrinsic Amplitude: 10.625 mV
Lead Channel Setting Pacing Amplitude: 2 V
Lead Channel Setting Pacing Amplitude: 2 V
Lead Channel Setting Pacing Pulse Width: 0.4 ms
Lead Channel Setting Pacing Pulse Width: 1 ms
Lead Channel Setting Sensing Sensitivity: 2.8 mV

## 2019-09-29 NOTE — Progress Notes (Signed)
Remote pacemaker transmission.   

## 2019-09-30 NOTE — Progress Notes (Signed)
Patient was No Show   This encounter was created in error - please disregard.

## 2019-10-02 ENCOUNTER — Other Ambulatory Visit: Payer: Self-pay

## 2019-10-02 ENCOUNTER — Encounter (HOSPITAL_COMMUNITY): Payer: Self-pay

## 2019-10-02 ENCOUNTER — Emergency Department (HOSPITAL_COMMUNITY): Payer: Medicare Other

## 2019-10-02 ENCOUNTER — Emergency Department (HOSPITAL_COMMUNITY)
Admission: EM | Admit: 2019-10-02 | Discharge: 2019-10-05 | Disposition: A | Discharge status: Home / Self Care | Payer: Medicare Other | Attending: Emergency Medicine | Admitting: Emergency Medicine

## 2019-10-02 ENCOUNTER — Telehealth: Payer: Self-pay | Admitting: Physician Assistant

## 2019-10-02 DIAGNOSIS — R52 Pain, unspecified: Secondary | ICD-10-CM | POA: Diagnosis not present

## 2019-10-02 DIAGNOSIS — Z743 Need for continuous supervision: Secondary | ICD-10-CM | POA: Diagnosis not present

## 2019-10-02 DIAGNOSIS — Z046 Encounter for general psychiatric examination, requested by authority: Secondary | ICD-10-CM | POA: Insufficient documentation

## 2019-10-02 DIAGNOSIS — Z950 Presence of cardiac pacemaker: Secondary | ICD-10-CM | POA: Insufficient documentation

## 2019-10-02 DIAGNOSIS — I130 Hypertensive heart and chronic kidney disease with heart failure and stage 1 through stage 4 chronic kidney disease, or unspecified chronic kidney disease: Secondary | ICD-10-CM | POA: Insufficient documentation

## 2019-10-02 DIAGNOSIS — Z79899 Other long term (current) drug therapy: Secondary | ICD-10-CM | POA: Insufficient documentation

## 2019-10-02 DIAGNOSIS — R0602 Shortness of breath: Secondary | ICD-10-CM

## 2019-10-02 DIAGNOSIS — F319 Bipolar disorder, unspecified: Secondary | ICD-10-CM

## 2019-10-02 DIAGNOSIS — Z7901 Long term (current) use of anticoagulants: Secondary | ICD-10-CM | POA: Insufficient documentation

## 2019-10-02 DIAGNOSIS — F330 Major depressive disorder, recurrent, mild: Secondary | ICD-10-CM | POA: Insufficient documentation

## 2019-10-02 DIAGNOSIS — F411 Generalized anxiety disorder: Secondary | ICD-10-CM | POA: Diagnosis not present

## 2019-10-02 DIAGNOSIS — Z87891 Personal history of nicotine dependence: Secondary | ICD-10-CM | POA: Insufficient documentation

## 2019-10-02 DIAGNOSIS — N183 Chronic kidney disease, stage 3 unspecified: Secondary | ICD-10-CM | POA: Insufficient documentation

## 2019-10-02 DIAGNOSIS — R0689 Other abnormalities of breathing: Secondary | ICD-10-CM | POA: Diagnosis not present

## 2019-10-02 DIAGNOSIS — G4700 Insomnia, unspecified: Secondary | ICD-10-CM | POA: Diagnosis not present

## 2019-10-02 DIAGNOSIS — I5042 Chronic combined systolic (congestive) and diastolic (congestive) heart failure: Secondary | ICD-10-CM | POA: Insufficient documentation

## 2019-10-02 DIAGNOSIS — R2243 Localized swelling, mass and lump, lower limb, bilateral: Secondary | ICD-10-CM | POA: Insufficient documentation

## 2019-10-02 LAB — CBC WITH DIFFERENTIAL/PLATELET
Abs Immature Granulocytes: 0.03 10*3/uL (ref 0.00–0.07)
Basophils Absolute: 0 10*3/uL (ref 0.0–0.1)
Basophils Relative: 0 %
Eosinophils Absolute: 0.2 10*3/uL (ref 0.0–0.5)
Eosinophils Relative: 4 %
HCT: 38.7 % (ref 36.0–46.0)
Hemoglobin: 12 g/dL (ref 12.0–15.0)
Immature Granulocytes: 1 %
Lymphocytes Relative: 23 %
Lymphs Abs: 1.5 10*3/uL (ref 0.7–4.0)
MCH: 33.8 pg (ref 26.0–34.0)
MCHC: 31 g/dL (ref 30.0–36.0)
MCV: 109 fL — ABNORMAL HIGH (ref 80.0–100.0)
Monocytes Absolute: 0.7 10*3/uL (ref 0.1–1.0)
Monocytes Relative: 11 %
Neutro Abs: 4.2 10*3/uL (ref 1.7–7.7)
Neutrophils Relative %: 61 %
Platelets: 152 10*3/uL (ref 150–400)
RBC: 3.55 MIL/uL — ABNORMAL LOW (ref 3.87–5.11)
RDW: 13.5 % (ref 11.5–15.5)
WBC: 6.7 10*3/uL (ref 4.0–10.5)
nRBC: 0 % (ref 0.0–0.2)

## 2019-10-02 LAB — COMPREHENSIVE METABOLIC PANEL
ALT: 13 U/L (ref 0–44)
AST: 23 U/L (ref 15–41)
Albumin: 3.7 g/dL (ref 3.5–5.0)
Alkaline Phosphatase: 52 U/L (ref 38–126)
Anion gap: 9 (ref 5–15)
BUN: 43 mg/dL — ABNORMAL HIGH (ref 8–23)
CO2: 25 mmol/L (ref 22–32)
Calcium: 8.9 mg/dL (ref 8.9–10.3)
Chloride: 104 mmol/L (ref 98–111)
Creatinine, Ser: 1.48 mg/dL — ABNORMAL HIGH (ref 0.44–1.00)
GFR calc Af Amer: 36 mL/min — ABNORMAL LOW (ref >60)
GFR calc non Af Amer: 31 mL/min — ABNORMAL LOW (ref >60)
Glucose, Bld: 109 mg/dL — ABNORMAL HIGH (ref 70–99)
Potassium: 4.6 mmol/L (ref 3.5–5.1)
Sodium: 138 mmol/L (ref 135–145)
Total Bilirubin: 0.6 mg/dL (ref 0.3–1.2)
Total Protein: 6.8 g/dL (ref 6.5–8.1)

## 2019-10-02 LAB — TROPONIN I (HIGH SENSITIVITY): Troponin I (High Sensitivity): 7 ng/L (ref <18)

## 2019-10-02 LAB — BRAIN NATRIURETIC PEPTIDE: B Natriuretic Peptide: 232 pg/mL — ABNORMAL HIGH (ref 0.0–100.0)

## 2019-10-02 MED ORDER — LORAZEPAM 1 MG PO TABS
1.0000 mg | ORAL_TABLET | Freq: Once | ORAL | Status: AC
  Administered 2019-10-02: 1 mg via ORAL
  Filled 2019-10-02: qty 1

## 2019-10-02 MED ORDER — ZOLPIDEM TARTRATE 5 MG PO TABS
5.0000 mg | ORAL_TABLET | Freq: Every evening | ORAL | 0 refills | Status: DC | PRN
Start: 2019-10-02 — End: 2019-10-02
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
## 2019-10-02 NOTE — ED Notes (Signed)
Pt given dinner tray.

## 2019-10-02 NOTE — Discharge Instructions (Addendum)
Continue medications as previously prescribed.  Try taking Ambien as prescribed today as needed for your insomnia.  Follow-up with your primary doctor and cardiologist this week, and return to the ER if symptoms significantly worsen or change.

## 2019-10-02 NOTE — ED Notes (Signed)
Rn spoke with pt's daughter [name] @[phone]. Daughter is concerned that pt is in a manic episode, reports that pt has not been sleeping, has been making multiple calls to family members throughout the day, spending excessive amounts of money, Dr Stark Jock notified about daughter's concerns and TTS ordered.
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
## 2019-10-02 NOTE — Telephone Encounter (Signed)
Patient call for lower extremity edema, orthopnea and shortness of breath.  Last week seen by Almyra Deforest, PA and her Lasix increased to 40 mg twice daily for 3 days and back to regular dose 20 mg daily.  While taking higher dose of Lasix her symptoms improved but never resolved.  She is having worsening shortness of breath and lower extremity edema.  I have advised her to start taking Lasix 40 mg twice daily and call us Tuesday for office appointment.  If symptoms worsen she will go to ER for further evaluation.  She is agree with plan and appreciative of call.

## 2019-10-02 NOTE — ED Notes (Signed)
Pt notified on need to stay the night in ED for observation based on recommendations from [facility] assessor. Pt unhappy about decision as she is so very concerned about her house and its' belongings. Pt states she is not going to stay. Dr. Stark Jock notified.

## 2019-10-02 NOTE — ED Notes (Signed)
Pt has agreed to stay overnight for observation since we were able to get [facility] PD to promise to "keep an eye out on her house" during the night. Per [facility] PD; she is well known to them.

## 2019-10-02 NOTE — ED Notes (Signed)
EKG handed to Dr. Stark Jock

## 2019-10-02 NOTE — ED Triage Notes (Signed)
PT reports SOB x 2 weeks and feels pacemaker is giving out and needs to replaced. Pt transmitted to [employer] several days ago and hasnt gotten results back
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
## 2019-10-03 MED ORDER — THIAMINE HCL 100 MG PO TABS
50.0000 mg | ORAL_TABLET | Freq: Every day | ORAL | Status: DC
  Administered 2019-10-03 – 2019-10-05 (×3): 50 mg via ORAL
  Filled 2019-10-03 (×3): qty 1

## 2019-10-03 MED ORDER — APIXABAN 2.5 MG PO TABS
2.5000 mg | ORAL_TABLET | Freq: Two times a day (BID) | ORAL | Status: DC
  Administered 2019-10-03 – 2019-10-05 (×5): 2.5 mg via ORAL
  Filled 2019-10-03 (×9): qty 1

## 2019-10-03 MED ORDER — ACETAMINOPHEN 325 MG PO TABS
650.0000 mg | ORAL_TABLET | Freq: Four times a day (QID) | ORAL | Status: DC | PRN
  Administered 2019-10-03 – 2019-10-05 (×3): 650 mg via ORAL
  Filled 2019-10-03 (×3): qty 2

## 2019-10-03 MED ORDER — MIRTAZAPINE 15 MG PO TABS
7.5000 mg | ORAL_TABLET | Freq: Every day | ORAL | Status: DC
  Administered 2019-10-03 – 2019-10-04 (×2): 7.5 mg via ORAL
  Filled 2019-10-03 (×2): qty 1

## 2019-10-03 MED ORDER — ARIPIPRAZOLE 2 MG PO TABS
2.0000 mg | ORAL_TABLET | Freq: Every day | ORAL | Status: DC
  Administered 2019-10-03 – 2019-10-05 (×3): 2 mg via ORAL
  Filled 2019-10-03 (×5): qty 1

## 2019-10-03 MED ORDER — CARVEDILOL 12.5 MG PO TABS
6.2500 mg | ORAL_TABLET | Freq: Two times a day (BID) | ORAL | Status: DC
  Administered 2019-10-03 – 2019-10-05 (×4): 6.25 mg via ORAL
  Filled 2019-10-03 (×4): qty 1

## 2019-10-03 MED ORDER — FUROSEMIDE 40 MG PO TABS: 20.0000 mg | ORAL_TABLET | Freq: Every day | ORAL | Status: DC

## 2019-10-03 MED ORDER — TRAZODONE HCL 50 MG PO TABS
150.0000 mg | ORAL_TABLET | Freq: Every day | ORAL | Status: DC
  Administered 2019-10-04: 150 mg via ORAL
  Filled 2019-10-03 (×2): qty 3

## 2019-10-03 MED ORDER — ACETAMINOPHEN 500 MG PO TABS
500.0000 mg | ORAL_TABLET | Freq: Once | ORAL | Status: AC
  Administered 2019-10-03: 500 mg via ORAL
  Filled 2019-10-03: qty 1

## 2019-10-03 MED ORDER — ALUM & MAG HYDROXIDE-SIMETH 200-200-20 MG/5ML PO SUSP
15.0000 mL | Freq: Four times a day (QID) | ORAL | Status: DC | PRN
  Administered 2019-10-03 – 2019-10-05 (×3): 15 mL via ORAL
  Filled 2019-10-03 (×3): qty 30

## 2019-10-03 MED ORDER — FUROSEMIDE 40 MG PO TABS
20.0000 mg | ORAL_TABLET | Freq: Every day | ORAL | Status: DC
  Administered 2019-10-03 – 2019-10-05 (×3): 20 mg via ORAL
  Filled 2019-10-03 (×3): qty 1

## 2019-10-03 MED ORDER — FOLIC ACID 1 MG PO TABS
1.0000 mg | ORAL_TABLET | Freq: Every day | ORAL | Status: DC
  Administered 2019-10-03 – 2019-10-05 (×3): 1 mg via ORAL
  Filled 2019-10-03 (×3): qty 1

## 2019-10-03 MED ORDER — POLYVINYL ALCOHOL 1.4 % OP SOLN
1.0000 [drp] | Freq: Every day | OPHTHALMIC | Status: DC | PRN
  Administered 2019-10-04: 1 [drp] via OPHTHALMIC
  Filled 2019-10-03: qty 15

## 2019-10-03 NOTE — ED Notes (Signed)
RN explained to pt that [facility] recommended she stay the night and will be recommended a facility to go to.   Pt states " have the damn doctor in here right now, you are treating me like an animal. I treat my animal better that this shit. I am walking out right now."  MD made aware. Pt refusing to change in scrubs and it refusing to give up her belongings.
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
## 2019-10-03 NOTE — ED Notes (Signed)
TTS began

## 2019-10-03 NOTE — Telephone Encounter (Signed)
Thank you Vin, I think she missed her appt this past Wednesday, agree we should get her back in the office soon. Unfortunately, I will all preop this week, will forward to [facility] to see if other APP has any opening.

## 2019-10-03 NOTE — ED Notes (Signed)
PT changed into ruby colored scrubs and wanded by security at this time. PT's purse and nightgown locked up in psych lockers at this time.
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
## 2019-10-03 NOTE — Telephone Encounter (Signed)
Thank you, I see she has sent in a remote transmission on 5/26

## 2019-10-04 NOTE — ED Provider Notes (Signed)
Blood pressure 100/60, pulse 73, temperature 98 F (36.7 C), temperature source Oral, resp. rate 18, SpO2 100 %.  In short, Kimberly [name] is a 86 y.o. female with a chief complaint of Pacemaker Problem .  Refer to the original H&P for additional details.  04:45 PM  Spoke with behavioral health.  They have psychiatrically cleared the patient for discharge.  They discussed the case in detail with the patient's daughter who will help with outpatient f/u.  In my evaluation of the patient she is calm and cooperative.  She is very mobile here in the ED and eating without difficulty.  She is able to recount a very accurate history and the TTS plan for discharge home with self-care and outpatient follow-up seems appropriate.   Margette Fast, MD 10/04/19 [phone]

## 2019-10-04 NOTE — Consult Note (Signed)
°  Patient is psych cleared at this time. She is alert and oriented. She appears competent to make medical decisions and has normal thought processes. She is coherent, and denies suicidal ideations, homicidal ideation, and hallucinations. Discussed with the daughter [name]) who disagrees but is open to outpatient resources. She was given the number to [facility] in [place].

## 2019-10-04 NOTE — ED Provider Notes (Signed)
Emergency Medicine Observation Re-evaluation Note  Kimberly [name] is a 86 y.o. female, seen on rounds today.  Pt initially presented to the ED for complaints of Pacemaker Problem Currently, the patient is medically cleared.  Physical Exam  BP 120/72   Pulse 70   Temp 97.7 F (36.5 C) (Oral)   Resp 18   SpO2 100%  Physical Exam  ED Course / MDM  EKG:EKG Interpretation  Date/Time:  Sunday Oct 02 2019 17:11:18 EDT Ventricular Rate:  74 PR Interval:    QRS Duration: 146 QT Interval:  448 QTC Calculation: 498 R Axis:   91 Text Interpretation: VENTRICULAR PACED RHYTHM No significant change since 09/18/2019 Confirmed by Veryl Speak [phone]) on 10/02/2019 5:21:19 PM    I have reviewed the labs performed to date as well as medications administered while in observation.  Recent changes in the last 24 hours include TTS eval which is recommending inpatient Geri psych. Plan  Current plan is for inpatient Geri psych bed search. Patient is not under full IVC at this time.   Hayden Rasmussen, MD 10/04/19 [phone]

## 2019-10-04 NOTE — Telephone Encounter (Signed)
Called the number listed for the patient mobile number and left a voice message letting her know that I was calling to see if I could get her scheduled this week to see an APP and to give me a call back at the office. I will try calling the patient again.
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
## 2019-10-04 NOTE — ED Notes (Signed)
Pt. Will need to call [facility] to gain access to home tomorrow. [facility] opens up at 7 am. Pt. Will need a taxi to get home as well tomorrow.

## 2019-10-04 NOTE — Telephone Encounter (Signed)
Called and spoke with Kimberly [name] who is on the patient's DPR on file and she stated that the patient is currently admitted to [facility] since Sunday.

## 2019-10-04 NOTE — ED Notes (Signed)
Attempted to call pts daughter regarding her mother. No answer at this time.

## 2019-10-05 MED ORDER — ZOLPIDEM TARTRATE 5 MG PO TABS
5.0000 mg | ORAL_TABLET | Freq: Every evening | ORAL | 0 refills | Status: DC | PRN
Start: 2019-10-05 — End: 2020-01-24

## 2019-10-05 NOTE — ED Notes (Signed)
Locksmith called and reported was at pt residence to ensure was at right address. Address confirmed and Locksmith inquired if pt was en route to residence. Locksmith informed pt still waiting transport via taxi. Locksmith asked patient if would be okay with unlocking front door and it be open when pt arrived. Pt gave verbal consent. Primary RN aware.

## 2019-10-05 NOTE — ED Notes (Signed)
Attempted to call Locksmith. The locksmith listed in pt chart does not service pt area. Attempted to call Sophire at [facility]. Voicemail is full and unable to leave message with call back number.

## 2019-10-05 NOTE — Progress Notes (Signed)
CSW received missed call from [place] from [place]. CSW in contact with Anderson Malta who explains that the locksmith that was suggested by 2nd shift CSW did not service patients area. Anderson Malta goes into detail and states that she was able to locate a locksmith that would assist patient and would coordinate with them for patient discharge.   TOC will continue to follow patient for discharge related needs  [place] Transitions of Care  Clinical Social Worker  Ph: [phone]

## 2019-10-05 NOTE — ED Notes (Addendum)
Reached out to several local locksmiths. [place] reported would have a technician out today but no sure of time. Mare Ferrari obtained pt address and provided [facility] ED contact information to call back when technician available.   Pt taxi cancelled and will arrange transport once locksmith appointment is coordinated.  Pt aware.

## 2019-10-05 NOTE — ED Notes (Signed)
Heard back from [place] and reported Sophire at [facility] covering ED.   CSW reported to call taxi company and see how much transportation would cost to pt residence.  Pt reported would self pay for ride.

## 2019-10-05 NOTE — ED Notes (Signed)
Contacted taxi service. Taxi will be here to pick up pt at 915. Taxi reported would need money up front and only accepted cash.   Pt reported has cash for ride.

## 2019-10-05 NOTE — ED Notes (Signed)
Reached out to clinical social work and inquiring about Psychologist, sport and exercise. CSW did not answer, left voicemail with callback number.

## 2019-10-05 NOTE — Telephone Encounter (Signed)
LMOM (DPR) advising that transmission from 09/28/19 shows 6 months until ERI. Advised next monthly battery check is scheduled for 10/31/19. Direct DC phone number provided for questions/concerns.

## 2019-10-05 NOTE — ED Notes (Signed)
Pt drinking cold soda at time of vital signs being obtained.

## 2019-10-06 ENCOUNTER — Telehealth: Payer: Self-pay | Admitting: Cardiovascular Disease

## 2019-10-06 NOTE — Telephone Encounter (Signed)
Kimberly [name] with [facility] answering service is calling to make Dr. Sallyanne Kuster aware that the patient called stating her ankles are swollen. She states the patient did not provide additional information. Please call to discuss.

## 2019-10-06 NOTE — Telephone Encounter (Signed)
Returned call to patient who reports ankle swelling has resolved. She is taking lasix as prescribed, elevating her legs when able. She reports her ankle swelling will fluctuate. Her home scale is not working properly at this time. She is now 152lbs yesterday. She will call back if she has any additional concerns.

## 2019-10-06 NOTE — Telephone Encounter (Signed)
Can we please give her a follow up call and see how dyspnea and edema are going? Enquire about weight changes. Double diuretic for 3 days and schedule follow up if symptoms have worsened or have not resolved, please.

## 2019-10-06 NOTE — Telephone Encounter (Addendum)
Returned call to patient, she states SOB and edema have not improved.  She states she is SOB at rest and with exertion.    Ankles have "dissapeared".    Denies abdominal distention.   Does not weight daily as her scale is broken at the moment.   She was weighed yesterday and was 152 (last weight in chart 5/22: 143 lbs).    States her baseline is ~135 lbs.    She does not have any issues lying flat to sleep.    She is currently taking lasix 20 mg daily.  Initially patient was audibly SOB on phone but states she had just walked from another room.   After speaking with patient for a while, she was able to talk in complete sentences without SOB.      Advised per Dr. Sallyanne Kuster to double to 40 mg daily for 3 days and OV scheduled for Monday 6/7 with APP.    Patient unable to come to appt tomorrow (due to transportation).   Advised to continue to elevate LE.     Also advised if symptoms worse, proceed to ER/call 911 to be evaluated/treated.   Patient aware and verbalized understanding.

## 2019-10-08 ENCOUNTER — Telehealth: Payer: Self-pay | Admitting: Physician Assistant
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
## 2019-10-08 NOTE — Progress Notes (Signed)
Called Kimberly [name] this weekend about ankle swelling again. Has appt with Denyse Amass this Monday.

## 2019-10-09 ENCOUNTER — Emergency Department (HOSPITAL_COMMUNITY)
Admission: EM | Admit: 2019-10-09 | Discharge: 2019-10-10 | Disposition: A | Discharge status: Home / Self Care | Payer: Medicare HMO | Attending: Emergency Medicine | Admitting: Emergency Medicine

## 2019-10-09 ENCOUNTER — Emergency Department (HOSPITAL_COMMUNITY): Payer: Medicare HMO

## 2019-10-09 ENCOUNTER — Other Ambulatory Visit: Payer: Self-pay

## 2019-10-09 ENCOUNTER — Encounter (HOSPITAL_COMMUNITY): Payer: Self-pay | Admitting: Emergency Medicine

## 2019-10-09 DIAGNOSIS — R1010 Upper abdominal pain, unspecified: Secondary | ICD-10-CM | POA: Diagnosis not present

## 2019-10-09 DIAGNOSIS — Z7901 Long term (current) use of anticoagulants: Secondary | ICD-10-CM | POA: Diagnosis not present

## 2019-10-09 DIAGNOSIS — Z87891 Personal history of nicotine dependence: Secondary | ICD-10-CM | POA: Diagnosis not present

## 2019-10-09 DIAGNOSIS — N183 Chronic kidney disease, stage 3 unspecified: Secondary | ICD-10-CM | POA: Diagnosis not present

## 2019-10-09 DIAGNOSIS — Z90710 Acquired absence of both cervix and uterus: Secondary | ICD-10-CM | POA: Insufficient documentation

## 2019-10-09 DIAGNOSIS — R1084 Generalized abdominal pain: Secondary | ICD-10-CM | POA: Diagnosis not present

## 2019-10-09 DIAGNOSIS — I5042 Chronic combined systolic (congestive) and diastolic (congestive) heart failure: Secondary | ICD-10-CM | POA: Insufficient documentation

## 2019-10-09 DIAGNOSIS — I130 Hypertensive heart and chronic kidney disease with heart failure and stage 1 through stage 4 chronic kidney disease, or unspecified chronic kidney disease: Secondary | ICD-10-CM | POA: Insufficient documentation

## 2019-10-09 DIAGNOSIS — Z9089 Acquired absence of other organs: Secondary | ICD-10-CM | POA: Insufficient documentation

## 2019-10-09 DIAGNOSIS — R000 Tachycardia, unspecified: Secondary | ICD-10-CM | POA: Diagnosis not present

## 2019-10-09 DIAGNOSIS — N200 Calculus of kidney: Secondary | ICD-10-CM | POA: Diagnosis not present

## 2019-10-09 DIAGNOSIS — Z950 Presence of cardiac pacemaker: Secondary | ICD-10-CM | POA: Diagnosis not present

## 2019-10-09 DIAGNOSIS — Z743 Need for continuous supervision: Secondary | ICD-10-CM | POA: Diagnosis not present

## 2019-10-09 DIAGNOSIS — Z79899 Other long term (current) drug therapy: Secondary | ICD-10-CM | POA: Diagnosis not present

## 2019-10-09 DIAGNOSIS — R109 Unspecified abdominal pain: Secondary | ICD-10-CM | POA: Diagnosis present

## 2019-10-09 DIAGNOSIS — I10 Essential (primary) hypertension: Secondary | ICD-10-CM | POA: Diagnosis not present

## 2019-10-09 LAB — URINALYSIS, ROUTINE W REFLEX MICROSCOPIC
Bilirubin Urine: NEGATIVE
Glucose, UA: NEGATIVE mg/dL
Hgb urine dipstick: NEGATIVE
Ketones, ur: NEGATIVE mg/dL
Nitrite: NEGATIVE
Protein, ur: NEGATIVE mg/dL
Specific Gravity, Urine: 1.012 (ref 1.005–1.030)
pH: 6 (ref 5.0–8.0)

## 2019-10-09 LAB — CBC
HCT: 35.7 % — ABNORMAL LOW (ref 36.0–46.0)
Hemoglobin: 11.4 g/dL — ABNORMAL LOW (ref 12.0–15.0)
MCH: 33.3 pg (ref 26.0–34.0)
MCHC: 31.9 g/dL (ref 30.0–36.0)
MCV: 104.4 fL — ABNORMAL HIGH (ref 80.0–100.0)
Platelets: 190 10*3/uL (ref 150–400)
RBC: 3.42 MIL/uL — ABNORMAL LOW (ref 3.87–5.11)
RDW: 13.5 % (ref 11.5–15.5)
WBC: 6.9 10*3/uL (ref 4.0–10.5)
nRBC: 0 % (ref 0.0–0.2)

## 2019-10-09 LAB — COMPREHENSIVE METABOLIC PANEL
ALT: 19 U/L (ref 0–44)
AST: 26 U/L (ref 15–41)
Albumin: 3.9 g/dL (ref 3.5–5.0)
Alkaline Phosphatase: 56 U/L (ref 38–126)
Anion gap: 11 (ref 5–15)
BUN: 40 mg/dL — ABNORMAL HIGH (ref 8–23)
CO2: 20 mmol/L — ABNORMAL LOW (ref 22–32)
Calcium: 8.6 mg/dL — ABNORMAL LOW (ref 8.9–10.3)
Chloride: 108 mmol/L (ref 98–111)
Creatinine, Ser: 1.25 mg/dL — ABNORMAL HIGH (ref 0.44–1.00)
GFR calc Af Amer: 44 mL/min — ABNORMAL LOW (ref >60)
GFR calc non Af Amer: 38 mL/min — ABNORMAL LOW (ref >60)
Glucose, Bld: 147 mg/dL — ABNORMAL HIGH (ref 70–99)
Potassium: 4.1 mmol/L (ref 3.5–5.1)
Sodium: 139 mmol/L (ref 135–145)
Total Bilirubin: 0.4 mg/dL (ref 0.3–1.2)
Total Protein: 7 g/dL (ref 6.5–8.1)

## 2019-10-09 LAB — LIPASE, BLOOD: Lipase: 60 U/L — ABNORMAL HIGH (ref 11–51)

## 2019-10-09 NOTE — ED Notes (Signed)
Pt aware she is ready to be discharged from facility and transport arrangements need to be made to get Pt home. This RN could not reach Pts daughter, as the phone number in the chart is no longer in service. This RN has also tried contacting multiple 24/7 [place], as the Pt is stating she no longer has a key to get into her house and this RN cannot reach anyone at this hour. This RN has also contacted [facility] PD to inquire if they could assist Pt gaining entry to her home, however they would only be able to provide 'Forced Entry' and the Pt does not want to pursue repair costs following their assistance. Charge Nurse and [facility] made aware of situation, and Pt aware of all attempts to assist her. APS have also been called and this RN has left a VM to call back regarding Pts ability to care for self.

## 2019-10-09 NOTE — ED Triage Notes (Signed)
Patient brought in via EMS from home. Alert and oriented. Airway patent. Patient c/o generalized abd pain. Denies any nausea, vomiting, diarrhea, fevers, or urinary symptoms. Patient states she has "abd adhesions" in which she is to follow-up with GI doctor."

## 2019-10-09 NOTE — Discharge Instructions (Addendum)
Take Tylenol for pain.  Call Dr. Orvan Falconer office tomorrow and make an appointment this week for follow-up.  He can follow-up your pain and your elevated pancreas enzymes

## 2019-10-09 NOTE — ED Provider Notes (Signed)
[facility] Provider Note   CSN: [id number] Arrival date & time: 10/09/19  1654     History Chief Complaint  Patient presents with  . Abdominal Pain    Kimberly [name] is a 86 y.o. female.  Patient complains of abdominal pain.  Patient has chronic abdominal pain.  Patient has a history of many surgeries and thinks she has adhesions.  Patient will not take any pain medicine because it makes her feel bad.  Also she has not been vomiting  The history is provided by the patient. No language interpreter was used.  Abdominal Pain Pain location:  Generalized Pain quality: aching   Pain radiates to:  Does not radiate Pain severity:  Mild Onset quality:  Gradual Timing:  Constant Progression:  Waxing and waning Chronicity:  New Context: not alcohol use   Relieved by:  Nothing Worsened by:  Nothing Associated symptoms: no chest pain, no cough, no diarrhea, no fatigue and no hematuria        Past Medical History:  Diagnosis Date  . Acute blood loss anemia 10/11/2012  . Acute diverticulitis 08/24/2013  . Acute on chronic combined systolic and diastolic CHF, NYHA class 4 ([place]) 11/15/2013  . Antral ulcer 10/11/2012  . Arrhythmia    atrial fibb  . Atrial fibrillation ([place])   . Cardiomyopathy, nonischemic ([place])   . Chronic anticoagulation 10/12/2012  . CKD (chronic kidney disease) stage 3, GFR 30-59 ml/min 10/12/2012  . Depression   . Erosive esophagitis 10/11/2012  . Fibromyalgia   . Glaucoma   . H/O echocardiogram 2007   EF 40-45%,         . Hypertension   . Osteoarthritis   . Pacemaker    Last saw cards 07/2013  . Scoliosis     Patient Active Problem List   Diagnosis Date Noted  . Finger dislocation, initial encounter 11/16/2018  . Hand trauma, left, initial encounter 11/16/2018  . Acute blood loss anemia 10/25/2018  . Rectal bleeding 10/23/2018  . Acute GI bleeding   . Enteritis of small intestine due to enterotoxigenic Escherichia coli associated with  diarrhea 03/24/2017  . Bipolar affective disorder ([place]) 03/23/2017  . Olfactory hallucinations 03/23/2017  . Lower GI bleed   . Glaucoma 02/28/2017  . Pacemaker 03/21/2015  . Non-ischemic cardiomyopathy ([place]) 11/28/2014  . PUD (peptic ulcer disease) 06/14/2014  . History of bipolar disorder   . Hypokalemia 05/26/2014  . Fall at home 05/26/2014  . Chronic diastolic heart failure ([place]) 11/15/2013  . Complete heart block ([place]) 11/15/2013  . Inability to ambulate due to ankle or foot 09/05/2013  . Unspecified constipation 08/26/2013  . Anemia 08/25/2013  . Acute diverticulitis 08/24/2013  . CKD (chronic kidney disease) stage 3, GFR 30-59 ml/min 10/12/2012  . Chronic anticoagulation 10/12/2012  . Osteoarthritis   . Permanent atrial fibrillation   . Biventricular cardiac pacemaker - [employer] Consulta   . Fibromyalgia   . Essential hypertension   . Depression     Past Surgical History:  Procedure Laterality Date  . ABDOMINAL HYSTERECTOMY    . APPENDECTOMY    . BACK SURGERY    . BIOPSY  03/03/2017   Procedure: BIOPSY;  Surgeon: Danie Binder, MD;  [facility];  Service: Endoscopy;;  gastric  . BREAST SURGERY    . CARDIAC CATHETERIZATION  12/08/2005   LAD AND LEFT MAIN WITH NO HIGH-GRADE STENOSIS. MILD DISEASE IN THE CX AND LAD SYSTEM. SEVERE LV DYSFUNCTION WITH DILATION OF THE  LV. EF 15-20%. LV END-DIASTOLIC PRESSURE IS 90. +1 MR.  . CHOLECYSTECTOMY    . COLONOSCOPY N/A 03/03/2017   Procedure: COLONOSCOPY;  Surgeon: Danie Binder, MD;  [facility];  Service: Endoscopy;  Laterality: N/A;  . CYSTOSCOPY N/A 02/24/2013   Procedure: CYSTOSCOPY WITH URETHRAL DILITATION;  Surgeon: Marissa Nestle, MD;  Location: [facility];  Service: Urology;  Laterality: N/A;  . DOPPLER ECHOCARDIOGRAPHY N/A 05/30/2010   LV SIZE IS NORMAL. LV SYSTOLIC FUNCTION IS LOW NORMAL. EF=50-55%. MILD INFERIOR HYPOKINESIS.MILD TO MODERATE POSTERIOR WALL HYPOKINESIS.PACEMAKER LEAD IN THE RV.  LA IS MILDLY DILATED. RA IS MODERATE TO SEVERLY DILATED. PACEMAKER LEAD IN THE RA. MILD CALCICICATION OF THE MV APPARATUS. MODERATE MR. MILD TO MODERATE TR. MILD PHTN.AV MILDLY SCLEROTIC.  [name] ESOPHAGOGASTRODUODENOSCOPY N/A 10/13/2012   Dr. Gala Romney: severe ulcerative reflux esophagitis, question of Barrett's but negative path, single deep prepyloric antral ulcer, negative H.pylori  . ESOPHAGOGASTRODUODENOSCOPY N/A 03/03/2017   Procedure: ESOPHAGOGASTRODUODENOSCOPY (EGD);  Surgeon: Danie Binder, MD;  [facility];  Service: Endoscopy;  Laterality: N/A;  . HERNIA REPAIR     right inguinal hernia and umbilical  . LOWETR EXT VENOUS Bilateral 11-08-10   R & L- NO EVIDENCE OF THROMBUS OR THROMBOPHLEBITIS. THERE IS MILD AMOUNT OF SUBCUTANEOUS EDEMA NOTED WITHIN THE LEFT CALF AND ANKLE. R & L GSV AND SSV- NO VENOUS INSUFF NOTED.  [name] NECK SURGERY    . NUCLEAR STRESS TEST N/A 02/13/2009   NORMAL PATTERN OF PERFUSION IN ALL REGIONS. POST STRESS VENTICULAR SIZE IS NORMAL. POST  STESS EF 85%.  NORMAL MYOCARDIAL PERFUSION STUDY.  [name] OPEN REDUCTION INTERNAL FIXATION (ORIF) DISTAL PHALANX Left 11/16/2018   Procedure: MIDDLE FINGER OPEN REDUCTION VERSUS RECONSTRUCTION;  Surgeon: Roseanne Kaufman, MD;  Location: [facility];  Service: Orthopedics;  Laterality: Left;  . PACEMAKER INSERTION    . POLYPECTOMY  03/03/2017   Procedure: POLYPECTOMY;  Surgeon: Danie Binder, MD;  [facility];  Service: Endoscopy;;  colon  . TONSILLECTOMY    . YAG LASER APPLICATION Bilateral 09/04/7739   Procedure: YAG LASER APPLICATION;  Surgeon: Williams Che, MD;  Location: [facility];  Service: Ophthalmology;  Laterality: Bilateral;     OB History    Gravida  5   Para  2   Term  1   Preterm  1   AB  3   Living  2     SAB  3   TAB      Ectopic      Multiple      Live Births              Family History  Problem Relation Age of Onset  . Cancer Sister   . Asthma Sister   . Heart failure Brother     . Pulmonary embolism Brother   . Cancer Brother   . Colon cancer Neg Hx     Social History   Tobacco Use  . Smoking status: Former Smoker    Packs/day: 0.50    Years: 30.00    Pack years: 15.00    Types: Cigarettes    Quit date: 12/13/2004    Years since quitting: 14.8  . Smokeless tobacco: Never Used  . Tobacco comment: former smoker  Substance Use Topics  . Alcohol use: Yes    Alcohol/week: 0.0 standard drinks    Comment: history of drinking a 1/2 glass of wine in the evening  . Drug use: No  Home Medications Prior to Admission medications   Medication Sig Start Date End Date Taking? Authorizing Provider  acetaminophen (TYLENOL) 325 MG tablet Take 2 tablets (650 mg total) by mouth every 6 (six) hours as needed for mild pain, fever or headache (or Fever >/= 101). 10/17/18  Yes Emokpae, Courage, MD  calcium carbonate (TUMS - DOSED IN MG ELEMENTAL CALCIUM) 500 MG chewable tablet Chew 1 tablet by mouth daily.   Yes [provider]  carvedilol (COREG) 6.25 MG tablet TAKE 1 TABLET BY MOUTH TWO  TIMES DAILY WITH A MEAL Patient taking differently: Take 6.25 mg by mouth 2 (two) times daily with a meal.  06/29/19  Yes Croitoru, Mihai, MD  ELIQUIS 2.5 MG TABS tablet TAKE 1 TABLET BY MOUTH  TWICE DAILY Patient taking differently: Take 2.5 mg by mouth 2 (two) times daily.  06/03/19  Yes Croitoru, Mihai, MD  furosemide (LASIX) 40 MG tablet Take 1 tablet (40 mg total) by mouth daily. Patient taking differently: Take 20-40 mg by mouth daily.  09/16/19  Yes Croitoru, Mihai, MD  mirtazapine (REMERON) 7.5 MG tablet Take 1 tablet (7.5 mg total) by mouth at bedtime. 09/06/19  Yes Ronnie Doss M, DO  Multiple Vitamins-Minerals (PRESERVISION AREDS 2) CAPS Take 1 capsule by mouth 2 (two) times daily.    Yes [provider]  Polyethylene Glycol 400 (BLINK TEARS) 0.25 % SOLN Apply 1 drop to eye daily as needed (for dry eye relief).   Yes [provider]  traZODone (DESYREL)  150 MG tablet Take 1 tablet (150 mg total) by mouth at bedtime. 08/01/19  Yes Dettinger, Fransisca Kaufmann, MD  zolpidem (AMBIEN) 5 MG tablet Take 1 tablet (5 mg total) by mouth at bedtime as needed for sleep. 10/05/19  Yes Milton Ferguson, MD  potassium chloride (KLOR-CON) 10 MEQ tablet Take 1 tablet (10 mEq total) by mouth daily. Take only when you take the 40 mg of Lasix. Patient not taking: Reported on 10/09/2019 09/16/19   Croitoru, Mihai, MD    Allergies    Ciprofloxacin, Flagyl [metronidazole], Papaya derivatives, Iodine, Penicillins, and Sulfa antibiotics  Review of Systems   Review of Systems  Constitutional: Negative for appetite change and fatigue.  HENT: Negative for congestion, ear discharge and sinus pressure.   Eyes: Negative for discharge.  Respiratory: Negative for cough.   Cardiovascular: Negative for chest pain.  Gastrointestinal: Positive for abdominal pain. Negative for diarrhea.  Genitourinary: Negative for frequency and hematuria.  Musculoskeletal: Negative for back pain.  Skin: Negative for rash.  Neurological: Negative for seizures and headaches.  Psychiatric/Behavioral: Negative for hallucinations.    Physical Exam Updated Vital Signs BP (!) 158/69 (BP Location: Left Arm)   Pulse 73   Temp (!) 97.5 F (36.4 C) (Oral)   Resp 18   Ht 5' 8.5" (1.74 m)   Wt 68.9 kg   SpO2 100%   BMI 22.78 kg/m   Physical Exam Vitals and nursing note reviewed.  Constitutional:      Appearance: She is well-developed.  HENT:     Head: Normocephalic.  Eyes:     General: No scleral icterus.    Conjunctiva/sclera: Conjunctivae normal.  Neck:     Thyroid: No thyromegaly.  Cardiovascular:     Rate and Rhythm: Normal rate and regular rhythm.     Heart sounds: No murmur. No friction rub. No gallop.   Pulmonary:     Breath sounds: No stridor. No wheezing or rales.  Chest:     Chest  wall: No tenderness.  Abdominal:     General: There is no distension.     Tenderness: There is  abdominal tenderness. There is no rebound.  Musculoskeletal:        General: Normal range of motion.     Cervical back: Neck supple.  Lymphadenopathy:     Cervical: No cervical adenopathy.  Skin:    Findings: No erythema or rash.  Neurological:     Mental Status: She is alert and oriented to person, place, and time.     Motor: No abnormal muscle tone.     Coordination: Coordination normal.  Psychiatric:        Behavior: Behavior normal.     ED Results / Procedures / Treatments   Labs (all labs ordered are listed, but only abnormal results are displayed) Labs Reviewed  LIPASE, BLOOD - Abnormal; Notable for the following components:      Result Value   Lipase 60 (*)    All other components within normal limits  COMPREHENSIVE METABOLIC PANEL - Abnormal; Notable for the following components:   CO2 20 (*)    Glucose, Bld 147 (*)    BUN 40 (*)    Creatinine, Ser 1.25 (*)    Calcium 8.6 (*)    GFR calc non Af Amer 38 (*)    GFR calc Af Amer 44 (*)    All other components within normal limits  CBC - Abnormal; Notable for the following components:   RBC 3.42 (*)    Hemoglobin 11.4 (*)    HCT 35.7 (*)    MCV 104.4 (*)    All other components within normal limits  URINALYSIS, ROUTINE W REFLEX MICROSCOPIC - Abnormal; Notable for the following components:   Color, Urine STRAW (*)    Leukocytes,Ua TRACE (*)    Bacteria, UA RARE (*)    All other components within normal limits    EKG None  Radiology CT ABDOMEN PELVIS WO CONTRAST  Result Date: 10/09/2019 CLINICAL DATA:  Generalized abdominal pain EXAM: CT ABDOMEN AND PELVIS WITHOUT CONTRAST TECHNIQUE: Multidetector CT imaging of the abdomen and pelvis was performed following the standard protocol without IV contrast. COMPARISON:  09/24/2019 FINDINGS: Lower chest: No acute pleural or parenchymal lung disease. Moderate hiatal hernia. Hepatobiliary: Stable hepatic hypodensities compatible with cysts. The gallbladder is surgically  absent. Pancreas: Unremarkable. No pancreatic ductal dilatation or surrounding inflammatory changes. Spleen: Normal in size without focal abnormality. Adrenals/Urinary Tract: Stable nonobstructing 4 mm right renal calculus. No obstructive uropathy within either kidney. Mild bilateral renal cortical atrophy unchanged. Bladder is decompressed which limits its evaluation. The adrenals are normal. Stomach/Bowel: No bowel obstruction or ileus. Diverticulosis of the sigmoid colon without diverticulitis. Vascular/Lymphatic: Aortic atherosclerosis. No enlarged abdominal or pelvic lymph nodes. Reproductive: Status post hysterectomy. No adnexal masses. Other: Small fat containing umbilical hernia again noted unchanged. No bowel herniation. No free fluid or free gas. Musculoskeletal: No acute or destructive bony lesions. Stable right convex scoliosis. Reconstructed images demonstrate no additional findings. IMPRESSION: 1. Stable nonobstructing 4 mm right renal calculus. 2. Moderate hiatal hernia. 3. Sigmoid diverticulosis without diverticulitis. 4. Aortic Atherosclerosis (ICD10-I70.0). Electronically Signed   By: Randa Ngo M.D.   On: 10/09/2019 21:04    Procedures Procedures (including critical care time)  Medications Ordered in ED Medications - No data to display  ED Course  I have reviewed the triage vital signs and the nursing notes.  Pertinent labs & imaging results that were available during my care of the  patient were reviewed by me and considered in my medical decision making (see chart for details).    MDM Rules/Calculators/A&P                     Patient with chronic abdominal pain.  Patient is nontoxic.  CT scan unremarkable.  Lipase mildly elevated.  She will follow up with GI      This patient presents to the ED for concern of abdominal pain this involves an extensive number of treatment options, and is a complaint that carries with it a high risk of complications and morbidity.  The  differential diagnosis includes small bowel obstruction   Lab Tests:   I Ordered, reviewed, and interpreted labs, which included CBC chemistries lipase which showed mild dehydration with elevated BUN and also elevated lipase  Medicines ordered:    Imaging Studies ordered:   I ordered imaging studies which included CT abdomen and  I independently visualized and interpreted imaging which showed no acute disease  Additional history obtained:   Additional history obtained from records  Previous records obtained and reviewed  Consultations Obtained:    Reevaluation:  After the interventions stated above, I reevaluated the patient and found mild improvement  Critical Interventions:  .   Final Clinical Impression(s) / ED Diagnoses Final diagnoses:  Pain of upper abdomen    Rx / DC Orders ED Discharge Orders    None       Milton Ferguson, MD 10/10/19 1103

## 2019-10-09 NOTE — Progress Notes (Deleted)
Cardiology Clinic Note   Patient Name: Kimberly [name] Date of Encounter: 10/09/2019  Primary Care Provider:  Patient, No Pcp Per Primary Cardiologist:  Sanda Klein, MD  Patient Profile    Kimberly [name] 86 year old female presents today for an evaluation of her lower extremity edema.  Past Medical History    Past Medical History:  Diagnosis Date  . Acute blood loss anemia 10/11/2012  . Acute diverticulitis 08/24/2013  . Acute on chronic combined systolic and diastolic CHF, NYHA class 4 ([place]) 11/15/2013  . Antral ulcer 10/11/2012  . Arrhythmia    atrial fibb  . Atrial fibrillation ([place])   . Cardiomyopathy, nonischemic ([place])   . Chronic anticoagulation 10/12/2012  . CKD (chronic kidney disease) stage 3, GFR 30-59 ml/min 10/12/2012  . Depression   . Erosive esophagitis 10/11/2012  . Fibromyalgia   . Glaucoma   . H/O echocardiogram 2007   EF 40-45%,         . Hypertension   . Osteoarthritis   . Pacemaker    Last saw cards 07/2013  . Scoliosis    Past Surgical History:  Procedure Laterality Date  . ABDOMINAL HYSTERECTOMY    . APPENDECTOMY    . BACK SURGERY    . BIOPSY  03/03/2017   Procedure: BIOPSY;  Surgeon: Danie Binder, MD;  [facility];  Service: Endoscopy;;  gastric  . BREAST SURGERY    . CARDIAC CATHETERIZATION  12/08/2005   LAD AND LEFT MAIN WITH NO HIGH-GRADE STENOSIS. MILD DISEASE IN THE CX AND LAD SYSTEM. SEVERE LV DYSFUNCTION WITH DILATION OF THE LV. EF 15-20%. LV END-DIASTOLIC PRESSURE IS 90. +1 MR.  . CHOLECYSTECTOMY    . COLONOSCOPY N/A 03/03/2017   Procedure: COLONOSCOPY;  Surgeon: Danie Binder, MD;  [facility];  Service: Endoscopy;  Laterality: N/A;  . CYSTOSCOPY N/A 02/24/2013   Procedure: CYSTOSCOPY WITH URETHRAL DILITATION;  Surgeon: Marissa Nestle, MD;  Location: [facility];  Service: Urology;  Laterality: N/A;  . DOPPLER ECHOCARDIOGRAPHY N/A 05/30/2010   LV SIZE IS NORMAL. LV SYSTOLIC FUNCTION IS LOW NORMAL. EF=50-55%. MILD  INFERIOR HYPOKINESIS.MILD TO MODERATE POSTERIOR WALL HYPOKINESIS.PACEMAKER LEAD IN THE RV. LA IS MILDLY DILATED. RA IS MODERATE TO SEVERLY DILATED. PACEMAKER LEAD IN THE RA. MILD CALCICICATION OF THE MV APPARATUS. MODERATE MR. MILD TO MODERATE TR. MILD PHTN.AV MILDLY SCLEROTIC.  [name] ESOPHAGOGASTRODUODENOSCOPY N/A 10/13/2012   Dr. Gala Romney: severe ulcerative reflux esophagitis, question of Barrett's but negative path, single deep prepyloric antral ulcer, negative H.pylori  . ESOPHAGOGASTRODUODENOSCOPY N/A 03/03/2017   Procedure: ESOPHAGOGASTRODUODENOSCOPY (EGD);  Surgeon: Danie Binder, MD;  [facility];  Service: Endoscopy;  Laterality: N/A;  . HERNIA REPAIR     right inguinal hernia and umbilical  . LOWETR EXT VENOUS Bilateral 11-08-10   R & L- NO EVIDENCE OF THROMBUS OR THROMBOPHLEBITIS. THERE IS MILD AMOUNT OF SUBCUTANEOUS EDEMA NOTED WITHIN THE LEFT CALF AND ANKLE. R & L GSV AND SSV- NO VENOUS INSUFF NOTED.  [name] NECK SURGERY    . NUCLEAR STRESS TEST N/A 02/13/2009   NORMAL PATTERN OF PERFUSION IN ALL REGIONS. POST STRESS VENTICULAR SIZE IS NORMAL. POST  STESS EF 85%.  NORMAL MYOCARDIAL PERFUSION STUDY.  [name] OPEN REDUCTION INTERNAL FIXATION (ORIF) DISTAL PHALANX Left 11/16/2018   Procedure: MIDDLE FINGER OPEN REDUCTION VERSUS RECONSTRUCTION;  Surgeon: Roseanne Kaufman, MD;  Location: [facility];  Service: Orthopedics;  Laterality: Left;  . PACEMAKER INSERTION    . POLYPECTOMY  03/03/2017  Procedure: POLYPECTOMY;  Surgeon: Danie Binder, MD;  [facility];  Service: Endoscopy;;  colon  . TONSILLECTOMY    . YAG LASER APPLICATION Bilateral 12/31/9369   Procedure: YAG LASER APPLICATION;  Surgeon: Williams Che, MD;  Location: [facility];  Service: Ophthalmology;  Laterality: Bilateral;    Allergies  Allergies  Allergen Reactions  . Ciprofloxacin Other (See Comments)    Possibly caused diarrhea November 2018  . Flagyl [Metronidazole] Other (See Comments)    Possibly caused diarrhea  November 2018  . Papaya Derivatives Hives  . Iodine Rash and Other (See Comments)    REACTION:If injected,  Rash/irritated skin reaction "welts"  . Penicillins Hives    DID THE REACTION INVOLVE: Swelling of the face/tongue/throat, SOB, or low BP? Unknown Sudden or severe rash/hives, skin peeling, or the inside of the mouth or nose? Yes Did it require medical treatment? Yes When did it last happen?67 or 86 years old If all above answers are "NO", may proceed with cephalosporin use.  . Sulfa Antibiotics Rash    History of Present Illness    Ms. Crigler has a PMH of permanent atrial fibrillation, essential hypertension, chronic diastolic CHF, complete heart block, nonischemic cardiomyopathy, lower GI bleed, CKD stage III, fibromyalgia, depression, pacemaker, and bipolar disorder.  She was seen by Almyra Deforest, PA-C on 5/21 at that time her furosemide was increased to 40 mg twice daily x3 days.  An echocardiogram was ordered but has not been completed.  She was then instructed to reduce back to her 20 mg daily dosing.  She presented to the emergency department 5/30-6/1 with increasing shortness of breath.  During the encounter she required psychiatric clearance due to concern of bipolar and dementia behaviors after concerns were raised by her daughter.  She was cleared by psych.  She was discharged home.  BP at that time was 123/66.  She contacted after hours triage line on 10/08/2019 and spoke with Sharrell Ku, PA-C.  She was instructed to increase her furosemide to 40 mg twice daily.  She presents the clinic today for follow-up evaluation and states***  *** denies chest pain, shortness of breath, lower extremity edema, fatigue, palpitations, melena, hematuria, hemoptysis, diaphoresis, weakness, presyncope, syncope, orthopnea, and PND.   Home Medications    Prior to Admission medications   Medication Sig Start Date End Date Taking? Authorizing Provider  acetaminophen (TYLENOL) 325 MG tablet  Take 2 tablets (650 mg total) by mouth every 6 (six) hours as needed for mild pain, fever or headache (or Fever >/= 101). 10/17/18   Roxan Hockey, MD  calcium carbonate (TUMS - DOSED IN MG ELEMENTAL CALCIUM) 500 MG chewable tablet Chew 1 tablet by mouth daily.    [provider]  carvedilol (COREG) 6.25 MG tablet TAKE 1 TABLET BY MOUTH TWO  TIMES DAILY WITH A MEAL Patient taking differently: Take 6.25 mg by mouth 2 (two) times daily with a meal.  06/29/19   Croitoru, Mihai, MD  ELIQUIS 2.5 MG TABS tablet TAKE 1 TABLET BY MOUTH  TWICE DAILY Patient taking differently: Take 2.5 mg by mouth 2 (two) times daily.  06/03/19   Croitoru, Mihai, MD  furosemide (LASIX) 40 MG tablet Take 1 tablet (40 mg total) by mouth daily. Patient taking differently: Take 20-40 mg by mouth daily.  09/16/19   Croitoru, Mihai, MD  mirtazapine (REMERON) 7.5 MG tablet Take 1 tablet (7.5 mg total) by mouth at bedtime. 09/06/19   [name], DO  Multiple Vitamins-Minerals (  PRESERVISION AREDS 2) CAPS Take 1 capsule by mouth 2 (two) times daily.     [provider]  Polyethylene Glycol 400 (BLINK TEARS) 0.25 % SOLN Apply 1 drop to eye daily as needed (for dry eye relief).    [provider]  potassium chloride (KLOR-CON) 10 MEQ tablet Take 1 tablet (10 mEq total) by mouth daily. Take only when you take the 40 mg of Lasix. 09/16/19   Croitoru, Mihai, MD  traZODone (DESYREL) 150 MG tablet Take 1 tablet (150 mg total) by mouth at bedtime. 08/01/19   Dettinger, Fransisca Kaufmann, MD  zolpidem (AMBIEN) 5 MG tablet Take 1 tablet (5 mg total) by mouth at bedtime as needed for sleep. 10/05/19   Milton Ferguson, MD    Family History    Family History  Problem Relation Age of Onset  . Cancer Sister   . Asthma Sister   . Heart failure Brother   . Pulmonary embolism Brother   . Cancer Brother   . Colon cancer Neg Hx    She indicated that her mother is deceased. She indicated that her father is deceased. She  indicated that both of her sisters are deceased. She indicated that all of her four brothers are deceased. She indicated that the status of her neg hx is unknown.  Social History    Social History   Socioeconomic History  . Marital status: Widowed    Spouse name: Not on file  . Number of children: Not on file  . Years of education: Not on file  . Highest education level: Some college, no degree  Occupational History  . Occupation: IT trainer: RETIRED    Comment: retired  Tobacco Use  . Smoking status: Former Smoker    Packs/day: 0.50    Years: 30.00    Pack years: 15.00    Types: Cigarettes    Quit date: 12/13/2004    Years since quitting: 14.8  . Smokeless tobacco: Never Used  . Tobacco comment: former smoker  Substance and Sexual Activity  . Alcohol use: Yes    Alcohol/week: 0.0 standard drinks    Comment: history of drinking a 1/2 glass of wine in the evening  . Drug use: No  . Sexual activity: Never  Other Topics Concern  . Not on file  Social History Narrative   No home exercise program. PT ordered this week.   Social Determinants of Health   Financial Resource Strain: Low Risk   . Difficulty of Paying Living Expenses: Not very hard  Food Insecurity: No Food Insecurity  . Worried About Charity fundraiser in the Last Year: Never true  . Ran Out of Food in the Last Year: Never true  Transportation Needs: No Transportation Needs  . Lack of Transportation (Medical): No  . Lack of Transportation (Non-Medical): No  Physical Activity: Inactive  . Days of Exercise per Week: 0 days  . Minutes of Exercise per Session: 0 min  Stress: No Stress Concern Present  . Feeling of Stress : Only a little  Social Connections: Moderately Isolated  . Frequency of Communication with Friends and Family: Twice a week  . Frequency of Social Gatherings with Friends and Family: Once a week  . Attends Religious Services: Never  . Active Member of Clubs or  Organizations: No  . Attends Archivist Meetings: Never  . Marital Status: Widowed  Intimate Partner Violence: Not At Risk  . Fear of Current or Ex-Partner: No  .  Emotionally Abused: No  . Physically Abused: No  . Sexually Abused: No     Review of Systems    General:  No chills, fever, night sweats or weight changes.  Cardiovascular:  No chest pain, dyspnea on exertion, edema, orthopnea, palpitations, paroxysmal nocturnal dyspnea. Dermatological: No rash, lesions/masses Respiratory: No cough, dyspnea Urologic: No hematuria, dysuria Abdominal:   No nausea, vomiting, diarrhea, bright red blood per rectum, melena, or hematemesis Neurologic:  No visual changes, wkns, changes in mental status. All other systems reviewed and are otherwise negative except as noted above.  Physical Exam    VS:  There were no vitals taken for this visit. , BMI There is no height or weight on file to calculate BMI. GEN: Well nourished, well developed, in no acute distress. HEENT: normal. Neck: Supple, no JVD, carotid bruits, or masses. Cardiac: RRR, no murmurs, rubs, or gallops. No clubbing, cyanosis, edema.  Radials/DP/PT 2+ and equal bilaterally.  Respiratory:  Respirations regular and unlabored, clear to auscultation bilaterally. GI: Soft, nontender, nondistended, BS + x 4. MS: no deformity or atrophy. Skin: warm and dry, no rash. Neuro:  Strength and sensation are intact. Psych: Normal affect.  Accessory Clinical Findings    ECG personally reviewed by me today- *** - No acute changes  EKG 10/04/2019 V-paced 74 bpm   Echocardiogram 11/28/2014 LVEF 60-65% Study Conclusions   - Left ventricle: The cavity size was normal. Wall thickness was  normal. Systolic function was normal. The estimated ejection  fraction was in the range of 60% to 65%. Wall motion was normal;  there were no regional wall motion abnormalities.  - Aortic valve: Mildly calcified annulus. Trileaflet; mildly    thickened leaflets. Valve area (VTI): 1.59 cm^2. Valve area  (Vmax): 1.8 cm^2.  - Mitral valve: Mildly calcified annulus. Mildly thickened leaflets  . There was mild regurgitation.  - Left atrium: The atrium was severely dilated.  - Right atrium: The atrium was severely dilated.  - Pulmonary arteries: Systolic pressure was mildly to moderately  increased. PA peak pressure: 39 mm Hg (S).  - Inferior vena cava: The vessel was dilated. The respirophasic  diameter changes were blunted (< 50%), consistent with elevated  central venous pressure.  - Technically adequate study.    Assessment & Plan   1.  Lower extremity edema/acute on chronic diastolic heart failure -no increased work of breathing or activity intolerance.  +1 pitting bilateral lower extremity edema to her shin. Continue furosemide 40 mg twice daily x3 days then resume 40 mg daily Take 20 mEq of potassium x3 days then resume 10 mill equivalent dosing Heart healthy low-sodium diet Increase physical activity as tolerated Lower extremity support stockings Daily weights Elevate lower extremities when not active Order BMP  Dyspnea-no increased work of breathing today.  Previously recommended follow-up echocardiogram on 09/23/2019. Reorder echocardiogram  Nonischemic cardiomyopathy-status post CRT-D.  Echocardiogram 2016 showed normal EF. Heart healthy low-sodium diet Increase physical activity as tolerated  Permanent atrial fibrillation status post AV nodal ablation-pacemaker dependent. Continue Eliquis Followed by EP  Social work consult ordered for transportation assistance.  Follow-up with Almyra Deforest PA-C on 10/17/2019  Jossie Ng. Bracken Moffa NP-C    10/09/2019, 12:21 PM [facility] [address] Office [phone] Fax [phone]

## 2019-10-10 ENCOUNTER — Ambulatory Visit: Payer: Medicare HMO | Admitting: General Practice

## 2019-10-10 ENCOUNTER — Encounter (HOSPITAL_COMMUNITY): Payer: Self-pay

## 2019-10-10 MED ORDER — APIXABAN 5 MG PO TABS: 5.0000 mg | ORAL_TABLET | Freq: Two times a day (BID) | ORAL | Status: DC

## 2019-10-10 MED ORDER — FUROSEMIDE 40 MG PO TABS: 40.0000 mg | ORAL_TABLET | Freq: Every day | ORAL | Status: DC

## 2019-10-10 MED ORDER — CARVEDILOL 12.5 MG PO TABS: 6.2500 mg | ORAL_TABLET | Freq: Two times a day (BID) | ORAL | Status: DC

## 2019-10-10 NOTE — ED Notes (Signed)
Pt has swelling to BLE.

## 2019-10-10 NOTE — ED Notes (Addendum)
Pt repeatedly hitting call light calling out from room demanding staff doit on her. Charge Nurse made aware and Pt reminded she is ready for discharge and there are many other medical Pts needing staff's attention. Pt began cursing at this RN and this RN left the room asking the Pt to please calm down.

## 2019-10-10 NOTE — ED Notes (Signed)
EDP to review chart and order medications.

## 2019-10-10 NOTE — ED Notes (Signed)
Pt requesting to be discharged, states " I know they think I am crazy" RN explained to pt that it would be morning before pt could be discharged due to transportation issues and issues with her keys, RN asked pt about locksmith coming out last week, pt states "nobody showed up, I never saw anyone".

## 2019-10-10 NOTE — Clinical Social Work Note (Signed)
Transition of Care [facility]) - Emergency Department Mini Assessment  Patient Details  Name: Kimberly [name] MRN: [id number] Date of Birth: 10/10/1926  Transition of Care [facility]) CM/SW Contact:    Sherie Don, [name] Phone Number: 10/10/2019, 10:41 AM  Clinical Narrative: Patient is a 86 year old female who presented to the ED for abdominal pain. Patient was also seen in the ED on 10/02/19. TOC received consult for possible SNF placement, however, patient has been cleared for discharge. Per RN, patient has locked herself out of the house again and her keys are inside.  CSW called [facility] and scheduled an appointment for noon. CSW met with patient in the ED to discuss the [facility] appointment. Patient agreeable to $75 cost to open the house. CSW asked about a PCP as patient does not currently have one listed. Patient reported she was discharged from her PCP's office and is in the process of scheduling a new patient appointment with a local PCP, but could not remember the name. CSW followed up with RN regarding the locksmith appointment and the need to call a taxi. CSW attempted to call [facility] to follow up with report made by RN this morning, but was unable to reach anyone. CSW left voicemail requesting call back due to concerns about the patient's safety at home. CSW received approval from [facility] for cab voucher. CSW called [employer] to schedule pick-up. Cost will be $50. Voucher completed in registration. Patient and NT updated that taxi will pick up patient to take her home.  CSW received call back from [place], Morey Hummingbird. Per Morey Hummingbird, based on RN's report from earlier they could not screen in the report. CSW discussed patient's frequent ED visits and noncompliance with her medical care as well as concerns about whether or not the patient is taking her medications for bipolar disorder. Morey Hummingbird agreed to make a referral to [place] program to follow up with the patient's management of her  mental health as well as to assess if the patient's living environment is safe or not. TOC signing off.  ED Mini Assessment: What brought you to the Emergency Department? : Abdominal pain Barriers to Discharge: ED Barriers Resolved Barrier interventions: Appointment with locksmith; called for taxi Means of departure: Taxi Interventions which prevented an admission or readmission: Transportation Screening, Other (must enter comment)(Appointment with locksmith Karie Mainland))  Patient Contact and Communications Key Contact 1: Kirby Funk Locksmith Contact Date: 10/10/19,   Contact time: 1007 Contact Phone Number: [phone] Call outcome: Locksmith scheduled for noon Patient states their goals for this hospitalization and ongoing recovery are:: Discharge home Choice offered to / list presented to : NA  Admission diagnosis:  abdomen pain Patient Active Problem List   Diagnosis Date Noted  . Finger dislocation, initial encounter 11/16/2018  . Hand trauma, left, initial encounter 11/16/2018  . Acute blood loss anemia 10/25/2018  . Rectal bleeding 10/23/2018  . Acute GI bleeding   . Enteritis of small intestine due to enterotoxigenic Escherichia coli associated with diarrhea 03/24/2017  . Bipolar affective disorder ([place]) 03/23/2017  . Olfactory hallucinations 03/23/2017  . Lower GI bleed   . Glaucoma 02/28/2017  . Pacemaker 03/21/2015  . Non-ischemic cardiomyopathy ([place]) 11/28/2014  . PUD (peptic ulcer disease) 06/14/2014  . History of bipolar disorder   . Hypokalemia 05/26/2014  . Fall at home 05/26/2014  . Chronic diastolic heart failure ([place]) 11/15/2013  . Complete heart block ([place]) 11/15/2013  . Inability to ambulate due to ankle  or foot 09/05/2013  . Unspecified constipation 08/26/2013  . Anemia 08/25/2013  . Acute diverticulitis 08/24/2013  . CKD (chronic kidney disease) stage 3, GFR 30-59 ml/min 10/12/2012  . Chronic anticoagulation 10/12/2012  . Osteoarthritis    . Permanent atrial fibrillation   . Biventricular cardiac pacemaker - [employer] Consulta   . Fibromyalgia   . Essential hypertension   . Depression    PCP:  Patient, No Pcp Per Pharmacy:   [place], [name] [address] Phone: [phone] Fax: [place], [place] [facility] [address] [place] [address] [place] [address] Phone: [phone] Fax: [phone]

## 2019-10-10 NOTE — ED Notes (Signed)
RN received call back from [place] on call 'Yolanda' and this RN was told she would discuss the Pts case and situation with her supervisor to "see if there is anything they can do about it."

## 2019-10-12 ENCOUNTER — Telehealth: Payer: Self-pay | Admitting: Physician Assistant

## 2019-10-12 DIAGNOSIS — K5730 Diverticulosis of large intestine without perforation or abscess without bleeding: Secondary | ICD-10-CM | POA: Diagnosis not present

## 2019-10-12 DIAGNOSIS — Z950 Presence of cardiac pacemaker: Secondary | ICD-10-CM | POA: Diagnosis not present

## 2019-10-12 DIAGNOSIS — Z743 Need for continuous supervision: Secondary | ICD-10-CM | POA: Diagnosis not present

## 2019-10-12 DIAGNOSIS — R519 Headache, unspecified: Secondary | ICD-10-CM | POA: Diagnosis not present

## 2019-10-12 DIAGNOSIS — K449 Diaphragmatic hernia without obstruction or gangrene: Secondary | ICD-10-CM | POA: Diagnosis not present

## 2019-10-12 DIAGNOSIS — I700 Atherosclerosis of aorta: Secondary | ICD-10-CM | POA: Diagnosis not present

## 2019-10-12 DIAGNOSIS — R1084 Generalized abdominal pain: Secondary | ICD-10-CM | POA: Diagnosis not present

## 2019-10-12 NOTE — Telephone Encounter (Addendum)
   The patient called the answering service after-hours today. Well known to answering service due to frequent calls. Called because she is having continued right sided abdominal pain and shortness of breath. Cursing. She has been seen in ED several times for this. She is upset because every time she comes to the ER she gets sent home. She is upset that they frequently take her to [facility] instead of [facility] per EMS protocol. She began having recurrent pain and dyspnea this evening. I advised that if symptoms have recurred unfortunately there is not much we can do over the phone and advised she call EMS once again. She said she did not appreciate this answer but I reiterated my advice. I offered to call for her but she declined, stating all she does is get sent back home. Given frequency of OP calls and prior social and transportation issues, strongly suggest outpatient f/u primary care as it seem this patient is having trouble managing herself in the outpatient setting after hours. She unfortunately canceled f/u in our office today.  Charlie Pitter, PA-C

## 2019-10-13 ENCOUNTER — Telehealth: Payer: Self-pay | Admitting: Internal Medicine

## 2019-10-13 ENCOUNTER — Encounter: Payer: Self-pay | Admitting: Internal Medicine

## 2019-10-13 ENCOUNTER — Ambulatory Visit: Payer: Medicare HMO | Admitting: Gastroenterology

## 2019-10-13 DIAGNOSIS — Z743 Need for continuous supervision: Secondary | ICD-10-CM | POA: Diagnosis not present

## 2019-10-13 DIAGNOSIS — I959 Hypotension, unspecified: Secondary | ICD-10-CM | POA: Diagnosis not present

## 2019-10-13 NOTE — Telephone Encounter (Signed)
Patient was a no show, scheduled appointment yesterday and also called yesterday afternoon to confirm appointment time

## 2019-10-14 NOTE — Telephone Encounter (Signed)
I unfortunately, am not seeing this patient anymore.  Not sure who she has established care with since being here.

## 2019-10-15 DIAGNOSIS — R1030 Lower abdominal pain, unspecified: Secondary | ICD-10-CM | POA: Diagnosis not present

## 2019-10-15 DIAGNOSIS — N3000 Acute cystitis without hematuria: Secondary | ICD-10-CM | POA: Diagnosis not present

## 2019-10-15 DIAGNOSIS — R110 Nausea: Secondary | ICD-10-CM | POA: Diagnosis not present

## 2019-10-15 DIAGNOSIS — R519 Headache, unspecified: Secondary | ICD-10-CM | POA: Diagnosis not present

## 2019-10-15 DIAGNOSIS — K449 Diaphragmatic hernia without obstruction or gangrene: Secondary | ICD-10-CM | POA: Diagnosis not present

## 2019-10-15 DIAGNOSIS — I700 Atherosclerosis of aorta: Secondary | ICD-10-CM | POA: Diagnosis not present

## 2019-10-15 DIAGNOSIS — Z743 Need for continuous supervision: Secondary | ICD-10-CM | POA: Diagnosis not present

## 2019-10-15 DIAGNOSIS — Z9049 Acquired absence of other specified parts of digestive tract: Secondary | ICD-10-CM | POA: Diagnosis not present

## 2019-10-15 DIAGNOSIS — R9082 White matter disease, unspecified: Secondary | ICD-10-CM | POA: Diagnosis not present

## 2019-10-15 DIAGNOSIS — N134 Hydroureter: Secondary | ICD-10-CM | POA: Diagnosis not present

## 2019-10-15 DIAGNOSIS — R1031 Right lower quadrant pain: Secondary | ICD-10-CM | POA: Diagnosis not present

## 2019-10-15 DIAGNOSIS — K5730 Diverticulosis of large intestine without perforation or abscess without bleeding: Secondary | ICD-10-CM | POA: Diagnosis not present

## 2019-10-15 DIAGNOSIS — I10 Essential (primary) hypertension: Secondary | ICD-10-CM | POA: Diagnosis not present

## 2019-10-15 DIAGNOSIS — R1084 Generalized abdominal pain: Secondary | ICD-10-CM | POA: Diagnosis not present

## 2019-10-16 DIAGNOSIS — R531 Weakness: Secondary | ICD-10-CM | POA: Diagnosis not present

## 2019-10-16 DIAGNOSIS — R6889 Other general symptoms and signs: Secondary | ICD-10-CM | POA: Diagnosis not present

## 2019-10-16 DIAGNOSIS — Z743 Need for continuous supervision: Secondary | ICD-10-CM | POA: Diagnosis not present

## 2019-10-17 ENCOUNTER — Telehealth: Payer: Self-pay | Admitting: Cardiovascular Disease

## 2019-10-17 ENCOUNTER — Ambulatory Visit: Payer: Medicare HMO | Admitting: *Deleted

## 2019-10-17 ENCOUNTER — Other Ambulatory Visit (HOSPITAL_COMMUNITY): Payer: Medicare HMO

## 2019-10-17 NOTE — Telephone Encounter (Signed)
Spoke with pt, she was placed on these medications by a dr dell'aria at [facility]. These medications were delivered to her home today after she was discharged from the hospital yesterday. Patient encouraged to follow up with her medical doctor for evaluation of reason for medications.

## 2019-10-17 NOTE — Telephone Encounter (Signed)
Spoke with pt, she reports for the last few hours her breathing has become labored. She reports having trouble lying flat to sleep last night. She reports she has no ankles because they are so swollen. She has not been able to weigh as her scales are broken. She was advised to call EMS because she said she was scared but then reports they will take her to [place] and she does not want to go there. She has no one that can drive her. She was given the okay to take double furosemide dose this morning and for the next 3 mornings if needed. She was again advised to call EMS if she feels someone needs to see her now. Patient voiced understanding

## 2019-10-17 NOTE — Telephone Encounter (Signed)
New Message  Pt c/o Shortness Of Breath: STAT if SOB developed within the last 24 hours or pt is noticeably SOB on the phone  1. Are you currently SOB (can you hear that pt is SOB on the phone)? Yes  2. How long have you been experiencing SOB? Patient states a while, but it is progressively worse.   3. Are you SOB when sitting or when up moving around? Both  4. Are you currently experiencing any other symptoms? SOB and diarrhea

## 2019-10-17 NOTE — Telephone Encounter (Signed)
New message  Pt c/o medication issue:  1. Name of Medication:  Ondansetron 4 mg (for nausea)  Phenazopyridine 200 mg  Cephalexin   2. How are you currently taking this medication (dosage and times per day)? As directed  3. Are you having a reaction (difficulty breathing--STAT)? Affecting speech, doesn't feel right, head is not clear.  4. What is your medication issue? Patient is calling in to see if she needs to take these medications. States that it is making her feel strange. Please give a call back to advise.

## 2019-10-17 NOTE — Telephone Encounter (Signed)
Pt c/o swelling: STAT is pt has developed SOB within 24 hours  1) How much weight have you gained and in what time span? No weight gain  2) If swelling, where is the swelling located? Ankles swelling  3) Are you currently taking a fluid pill? yes  4) Are you currently SOB? States she is always SOB - has been going on for a really long time  5) Do you have a log of your daily weights (if so, list)? no  6) Have you gained 3 pounds in a day or 5 pounds in a week? no  7) Have you traveled recently? no

## 2019-10-17 NOTE — Chronic Care Management (AMB) (Signed)
  Chronic Care Management   Note  10/17/2019 Name: Kimberly [name] MRN: [id number] DOB: 09/04/1926  Patient was scheduled for a CCM follow-up today but has been discharged from the practice since 09/21/19 due to inpaproriate behavior towards staff. She was mailed a letter of dismissal and has spoke with both clinical and front office staff regarding this. Theadore Nan, [name] has worked with patient regarding other PCPs in the area.   Follow up plan: No further follow up required: Pt has been discharged from practice  [name], BSN, RN-BC [place] [facility] [place] Management Direct Dial: [phone]

## 2019-10-18 DIAGNOSIS — R1031 Right lower quadrant pain: Secondary | ICD-10-CM | POA: Diagnosis not present

## 2019-10-18 DIAGNOSIS — R0689 Other abnormalities of breathing: Secondary | ICD-10-CM | POA: Diagnosis not present

## 2019-10-18 DIAGNOSIS — R519 Headache, unspecified: Secondary | ICD-10-CM | POA: Diagnosis not present

## 2019-10-18 DIAGNOSIS — R109 Unspecified abdominal pain: Secondary | ICD-10-CM | POA: Diagnosis not present

## 2019-10-18 DIAGNOSIS — Z950 Presence of cardiac pacemaker: Secondary | ICD-10-CM | POA: Diagnosis not present

## 2019-10-18 DIAGNOSIS — Z743 Need for continuous supervision: Secondary | ICD-10-CM | POA: Diagnosis not present

## 2019-10-18 DIAGNOSIS — K602 Anal fissure, unspecified: Secondary | ICD-10-CM | POA: Diagnosis not present

## 2019-10-18 DIAGNOSIS — R1011 Right upper quadrant pain: Secondary | ICD-10-CM | POA: Diagnosis not present

## 2019-10-18 DIAGNOSIS — R1084 Generalized abdominal pain: Secondary | ICD-10-CM | POA: Diagnosis not present

## 2019-10-18 DIAGNOSIS — R000 Tachycardia, unspecified: Secondary | ICD-10-CM | POA: Diagnosis not present

## 2019-10-18 DIAGNOSIS — R58 Hemorrhage, not elsewhere classified: Secondary | ICD-10-CM | POA: Diagnosis not present

## 2019-10-19 ENCOUNTER — Telehealth: Payer: Self-pay

## 2019-10-19 ENCOUNTER — Ambulatory Visit: Payer: Medicare Other | Admitting: Family Medicine

## 2019-10-19 DIAGNOSIS — R109 Unspecified abdominal pain: Secondary | ICD-10-CM | POA: Diagnosis not present

## 2019-10-20 ENCOUNTER — Telehealth: Payer: Self-pay | Admitting: Cardiovascular Disease

## 2019-10-20 NOTE — Telephone Encounter (Signed)
Okay to use medication as prescribed

## 2019-10-20 NOTE — Telephone Encounter (Signed)
Patient is calling to make Dr. Sallyanne Kuster aware that she has been prescribed Hydrocortisone for hemorrhoids. She states she would like to ensure that this medication will not interfere with any of her other medications. Please call.

## 2019-10-20 NOTE — Telephone Encounter (Signed)
Will route to PharmD to advise of medication question. Thanks!

## 2019-10-20 NOTE — Telephone Encounter (Signed)
Called patient- gave message from PharmD. Patient verbalized understanding.

## 2019-10-25 ENCOUNTER — Ambulatory Visit: Payer: Medicare HMO | Admitting: Physician Assistant

## 2019-10-26 ENCOUNTER — Other Ambulatory Visit: Payer: Self-pay | Admitting: *Deleted

## 2019-10-28 ENCOUNTER — Telehealth: Payer: Self-pay | Admitting: Cardiovascular Disease

## 2019-10-28 NOTE — Telephone Encounter (Signed)
Pt called ans said her heart rhythm felt off. She sent one home remote transmission in and was calling to check to see if the office got it. She will be sending another one as well

## 2019-10-28 NOTE — Telephone Encounter (Signed)
Spoke with patient who reports feeling tired and concerned abut her heart rhythm. Remote transmission reviewed, device function WNL, no alerts recorded  for VT, fast A & V rates. Presenting is BIV paced rhythm at 75. Patient reports not sleeping well, denies CP, SOB, chest pressure, dizziness or syncope. ED precautions given.

## 2019-10-31 ENCOUNTER — Ambulatory Visit (INDEPENDENT_AMBULATORY_CARE_PROVIDER_SITE_OTHER): Payer: Medicare HMO | Admitting: *Deleted

## 2019-10-31 DIAGNOSIS — I428 Other cardiomyopathies: Secondary | ICD-10-CM

## 2019-11-01 ENCOUNTER — Telehealth: Payer: Self-pay | Admitting: Cardiovascular Disease

## 2019-11-01 ENCOUNTER — Other Ambulatory Visit: Payer: Self-pay | Admitting: Cardiovascular Disease

## 2019-11-01 LAB — CUP PACEART REMOTE DEVICE CHECK
Battery Remaining Longevity: 6 mo
Battery Voltage: 2.84 V
Brady Statistic AP VP Percent: 0 %
Brady Statistic AP VS Percent: 0 %
Brady Statistic AS VP Percent: 100 %
Brady Statistic AS VS Percent: 0 %
Brady Statistic RA Percent Paced: 0 %
Brady Statistic RV Percent Paced: 99.3 %
Date Time Interrogation Session: 20210628165330
Implantable Lead Implant Date: 19971124
Implantable Lead Implant Date: 20070831
Implantable Lead Location: 753858
Implantable Lead Location: 753860
Implantable Lead Model: 4024
Implantable Lead Model: 4194
Implantable Pulse Generator Implant Date: 20120619
Lead Channel Impedance Value: 4047 Ohm
Lead Channel Impedance Value: 4047 Ohm
Lead Channel Impedance Value: 456 Ohm
Lead Channel Impedance Value: 475 Ohm
Lead Channel Impedance Value: 494 Ohm
Lead Channel Impedance Value: 532 Ohm
Lead Channel Impedance Value: 665 Ohm
Lead Channel Impedance Value: 722 Ohm
Lead Channel Impedance Value: 931 Ohm
Lead Channel Pacing Threshold Amplitude: 0.75 V
Lead Channel Pacing Threshold Amplitude: 1.375 V
Lead Channel Pacing Threshold Pulse Width: 0.4 ms
Lead Channel Pacing Threshold Pulse Width: 1 ms
Lead Channel Sensing Intrinsic Amplitude: 10.625 mV
Lead Channel Sensing Intrinsic Amplitude: 10.625 mV
Lead Channel Setting Pacing Amplitude: 2 V
Lead Channel Setting Pacing Amplitude: 2.5 V
Lead Channel Setting Pacing Pulse Width: 0.4 ms
Lead Channel Setting Pacing Pulse Width: 1 ms
Lead Channel Setting Sensing Sensitivity: 2.8 mV

## 2019-11-01 NOTE — Telephone Encounter (Signed)
I spoke with patient and gave her information from Dr Sallyanne Kuster

## 2019-11-01 NOTE — Telephone Encounter (Signed)
Reviewed chart.  Per Dr. Sallyanne Kuster has had several falls and concern for future falls.  To stay at 2.5 mg bid dosing

## 2019-11-01 NOTE — Telephone Encounter (Signed)
New Message  Patient is calling in to see if she would be able to donate blood and if so, where can she donate blood in [place]. Please give patient a call back to advise.

## 2019-11-01 NOTE — Telephone Encounter (Signed)
Left message to call office

## 2019-11-01 NOTE — Telephone Encounter (Signed)
Due to her medications (anticoagulant), she cannot donate blood (the medication would be transmitted to the transfusion recipient).

## 2019-11-01 NOTE — Telephone Encounter (Signed)
49f, 63kg, scr 1.25 10/09/19, lovw/meng 09/23/19, pt requesting 2.5mg  eliquis when they qualify for the 5mg  based on dosing guidelines. Will route to the pharmd pool for further review

## 2019-11-01 NOTE — Telephone Encounter (Signed)
Patient returning call.

## 2019-11-02 NOTE — Progress Notes (Signed)
Remote pacemaker transmission.   

## 2019-11-02 NOTE — Addendum Note (Signed)
Addended by: Cheri Kearns A on: 11/02/2019 10:34 AM   Modules accepted: Level of Service

## 2019-11-03 NOTE — Telephone Encounter (Signed)
Suspect symptoms are not cardiac in etiology. Note markedly increased activity level as recorded by her device, previously associated with acute manic periods

## 2019-11-09 ENCOUNTER — Ambulatory Visit: Payer: Medicare HMO | Admitting: Physician Assistant

## 2019-11-14 ENCOUNTER — Other Ambulatory Visit (HOSPITAL_COMMUNITY): Payer: Medicare HMO

## 2019-11-16 ENCOUNTER — Emergency Department (HOSPITAL_COMMUNITY): Payer: Medicare HMO

## 2019-11-16 ENCOUNTER — Other Ambulatory Visit: Payer: Self-pay | Admitting: Cardiovascular Disease

## 2019-11-16 ENCOUNTER — Telehealth: Payer: Self-pay | Admitting: Cardiovascular Disease

## 2019-11-16 ENCOUNTER — Encounter (HOSPITAL_COMMUNITY): Payer: Self-pay | Admitting: Emergency Medicine

## 2019-11-16 ENCOUNTER — Emergency Department (HOSPITAL_COMMUNITY)
Admission: EM | Admit: 2019-11-16 | Discharge: 2019-11-16 | Disposition: A | Discharge status: Home / Self Care | Payer: Medicare HMO | Attending: Emergency Medicine | Admitting: Emergency Medicine

## 2019-11-16 DIAGNOSIS — N183 Chronic kidney disease, stage 3 unspecified: Secondary | ICD-10-CM | POA: Diagnosis not present

## 2019-11-16 DIAGNOSIS — I10 Essential (primary) hypertension: Secondary | ICD-10-CM | POA: Diagnosis not present

## 2019-11-16 DIAGNOSIS — M5481 Occipital neuralgia: Secondary | ICD-10-CM | POA: Diagnosis not present

## 2019-11-16 DIAGNOSIS — Z87891 Personal history of nicotine dependence: Secondary | ICD-10-CM | POA: Insufficient documentation

## 2019-11-16 DIAGNOSIS — I5043 Acute on chronic combined systolic (congestive) and diastolic (congestive) heart failure: Secondary | ICD-10-CM | POA: Diagnosis not present

## 2019-11-16 DIAGNOSIS — R69 Illness, unspecified: Secondary | ICD-10-CM | POA: Diagnosis not present

## 2019-11-16 DIAGNOSIS — R519 Headache, unspecified: Secondary | ICD-10-CM | POA: Diagnosis not present

## 2019-11-16 DIAGNOSIS — R069 Unspecified abnormalities of breathing: Secondary | ICD-10-CM | POA: Diagnosis not present

## 2019-11-16 DIAGNOSIS — R5381 Other malaise: Secondary | ICD-10-CM | POA: Diagnosis not present

## 2019-11-16 DIAGNOSIS — R52 Pain, unspecified: Secondary | ICD-10-CM | POA: Diagnosis not present

## 2019-11-16 DIAGNOSIS — G4489 Other headache syndrome: Secondary | ICD-10-CM | POA: Diagnosis not present

## 2019-11-16 DIAGNOSIS — I130 Hypertensive heart and chronic kidney disease with heart failure and stage 1 through stage 4 chronic kidney disease, or unspecified chronic kidney disease: Secondary | ICD-10-CM | POA: Insufficient documentation

## 2019-11-16 DIAGNOSIS — Z79899 Other long term (current) drug therapy: Secondary | ICD-10-CM | POA: Insufficient documentation

## 2019-11-16 DIAGNOSIS — Z7401 Bed confinement status: Secondary | ICD-10-CM | POA: Diagnosis not present

## 2019-11-16 DIAGNOSIS — R0989 Other specified symptoms and signs involving the circulatory and respiratory systems: Secondary | ICD-10-CM | POA: Diagnosis not present

## 2019-11-16 MED ORDER — HYDROCODONE-ACETAMINOPHEN 5-325 MG PO TABS
2.0000 | ORAL_TABLET | Freq: Once | ORAL | Status: AC
  Administered 2019-11-16: 2 via ORAL
  Filled 2019-11-16: qty 2

## 2019-11-16 MED ORDER — MORPHINE SULFATE (PF) 4 MG/ML IV SOLN
4.0000 mg | Freq: Once | INTRAVENOUS | Status: DC
  Filled 2019-11-16: qty 1

## 2019-11-16 MED ORDER — METOCLOPRAMIDE HCL 5 MG/ML IJ SOLN
10.0000 mg | Freq: Once | INTRAMUSCULAR | Status: DC
  Filled 2019-11-16: qty 2

## 2019-11-16 MED ORDER — TRAMADOL HCL 50 MG PO TABS: 50.0000 mg | ORAL_TABLET | Freq: Four times a day (QID) | ORAL | 0 refills | Status: DC | PRN

## 2019-11-16 MED ORDER — DIPHENHYDRAMINE HCL 50 MG/ML IJ SOLN
25.0000 mg | Freq: Once | INTRAMUSCULAR | Status: DC
  Filled 2019-11-16: qty 1

## 2019-11-16 MED ORDER — KETOROLAC TROMETHAMINE 60 MG/2ML IM SOLN
60.0000 mg | Freq: Once | INTRAMUSCULAR | Status: AC
  Administered 2019-11-16: 60 mg via INTRAMUSCULAR
  Filled 2019-11-16: qty 2

## 2019-11-16 NOTE — ED Notes (Signed)
Pt refusing IV at this time. States veins roll. Pt will not allow this nurse to assess arm for IV placement. Larina Earthly, MD.

## 2019-11-16 NOTE — Discharge Instructions (Signed)
Your CT scan is normal -  Please take ibuprofen 3 times a day up to 400mg  per dose,  Tramadol every 8 hours only for severe pain ER for worsening symptoms.  See your doctor in 2-3 days for recheck Drink plenty of fluids

## 2019-11-16 NOTE — ED Notes (Signed)
Michelle Bullins, APS, states to call when pt is discharge regarding transportation.   [phone]

## 2019-11-16 NOTE — Telephone Encounter (Signed)
Pt c/o Shortness Of Breath: STAT if SOB developed within the last 24 hours or pt is noticeably SOB on the phone  1. Are you currently SOB (can you hear that pt is SOB on the phone)? Yes, patient states her breathing is labored.   2. How long have you been experiencing SOB? Woke up at 4am, had trouble breathing after getting back into bed after using the restroom, so she decided to stay up.   3. Are you SOB when sitting or when up moving around? Sob sitting and up moving around  4. Are you currently experiencing any other symptoms? A little bit of lightheadedness and a ha. Patient states that is unusual for her, she never gets ha.

## 2019-11-16 NOTE — ED Provider Notes (Signed)
[name] Provider Note   CSN: [id number] Arrival date & time: 11/16/19  1129     History Chief Complaint  Patient presents with  . Shortness of Breath    [name] is a 86 y.o. female.  HPI    This patient is a 86 year old female, she has a history of atrial fibrillation status post pacemaker placement many years ago, on Eliquis for many years.  This patient is chronically short of breath and presents today with a complaint of a headache.  She reports that this headache came on acutely 4 or 5 days ago, it has been persistent, it is occipital, it is severe and rated 10 out of 10 at this time.  She states that she has no changes in her vision or her ability to walk or use her arms or legs.  The pain has been slightly improved with ibuprofen but came back much more intense this morning around 4:00 in the morning and has been intolerable since that time.  There is been no nausea, no vomiting, no seizures, her appetite has been okay, she has not had prior headaches to this degree in the past.  Past Medical History:  Diagnosis Date  . Acute blood loss anemia 10/11/2012  . Acute diverticulitis 08/24/2013  . Acute on chronic combined systolic and diastolic CHF, NYHA class 4 ([place]) 11/15/2013  . Antral ulcer 10/11/2012  . Arrhythmia    atrial fibb  . Atrial fibrillation ([place])   . Bipolar affective disorder ([place]) 03/23/2017  . Cardiomyopathy, nonischemic ([place])   . Chronic anticoagulation 10/12/2012  . CKD (chronic kidney disease) stage 3, GFR 30-59 ml/min 10/12/2012  . Depression   . Erosive esophagitis 10/11/2012  . Fibromyalgia   . Glaucoma   . H/O echocardiogram 2007   EF 40-45%,         . Hypertension   . Osteoarthritis   . Pacemaker    Last saw cards 07/2013  . Scoliosis     Patient Active Problem List   Diagnosis Date Noted  . Finger dislocation, initial encounter 11/16/2018  . Hand trauma, left, initial encounter 11/16/2018  . Acute blood loss anemia  10/25/2018  . Rectal bleeding 10/23/2018  . Acute GI bleeding   . Enteritis of small intestine due to enterotoxigenic Escherichia coli associated with diarrhea 03/24/2017  . Bipolar affective disorder ([facility]) 03/23/2017  . Olfactory hallucinations 03/23/2017  . Lower GI bleed   . Glaucoma 02/28/2017  . Pacemaker 03/21/2015  . Non-ischemic cardiomyopathy ([place]) 11/28/2014  . PUD (peptic ulcer disease) 06/14/2014  . History of bipolar disorder   . Hypokalemia 05/26/2014  . Fall at home 05/26/2014  . Chronic diastolic heart failure ([place]) 11/15/2013  . Complete heart block ([place]) 11/15/2013  . Inability to ambulate due to ankle or foot 09/05/2013  . Unspecified constipation 08/26/2013  . Anemia 08/25/2013  . Acute diverticulitis 08/24/2013  . CKD (chronic kidney disease) stage 3, GFR 30-59 ml/min 10/12/2012  . Chronic anticoagulation 10/12/2012  . Osteoarthritis   . Permanent atrial fibrillation   . Biventricular cardiac pacemaker - [employer] Consulta   . Fibromyalgia   . Essential hypertension   . Depression     Past Surgical History:  Procedure Laterality Date  . ABDOMINAL HYSTERECTOMY    . APPENDECTOMY    . BACK SURGERY    . BIOPSY  03/03/2017   Procedure: BIOPSY;  Surgeon: Danie Binder, MD;  Location[facility];  Service: Endoscopy;;  gastric  .  BREAST SURGERY    . CARDIAC CATHETERIZATION  12/08/2005   LAD AND LEFT MAIN WITH NO HIGH-GRADE STENOSIS. MILD DISEASE IN THE CX AND LAD SYSTEM. SEVERE LV DYSFUNCTION WITH DILATION OF THE LV. EF 15-20%. LV END-DIASTOLIC PRESSURE IS 90. +1 MR.  . CHOLECYSTECTOMY    . COLONOSCOPY N/A 03/03/2017   Procedure: COLONOSCOPY;  Surgeon: Danie Binder, MD;  Location[facility];  Service: Endoscopy;  Laterality: N/A;  . CYSTOSCOPY N/A 02/24/2013   Procedure: CYSTOSCOPY WITH URETHRAL DILITATION;  Surgeon: Marissa Nestle, MD;  Location: [facility];  Service: Urology;  Laterality: N/A;  . DOPPLER ECHOCARDIOGRAPHY N/A 05/30/2010   LV SIZE  IS NORMAL. LV SYSTOLIC FUNCTION IS LOW NORMAL. EF=50-55%. MILD INFERIOR HYPOKINESIS.MILD TO MODERATE POSTERIOR WALL HYPOKINESIS.PACEMAKER LEAD IN THE RV. LA IS MILDLY DILATED. RA IS MODERATE TO SEVERLY DILATED. PACEMAKER LEAD IN THE RA. MILD CALCICICATION OF THE MV APPARATUS. MODERATE MR. MILD TO MODERATE TR. MILD PHTN.AV MILDLY SCLEROTIC.  [name] ESOPHAGOGASTRODUODENOSCOPY N/A 10/13/2012   Dr. Gala Romney: severe ulcerative reflux esophagitis, question of Barrett's but negative path, single deep prepyloric antral ulcer, negative H.pylori  . ESOPHAGOGASTRODUODENOSCOPY N/A 03/03/2017   Procedure: ESOPHAGOGASTRODUODENOSCOPY (EGD);  Surgeon: Danie Binder, MD;  [facility];  Service: Endoscopy;  Laterality: N/A;  . HERNIA REPAIR     right inguinal hernia and umbilical  . LOWETR EXT VENOUS Bilateral 11-08-10   R & L- NO EVIDENCE OF THROMBUS OR THROMBOPHLEBITIS. THERE IS MILD AMOUNT OF SUBCUTANEOUS EDEMA NOTED WITHIN THE LEFT CALF AND ANKLE. R & L GSV AND SSV- NO VENOUS INSUFF NOTED.  [name] NECK SURGERY    . NUCLEAR STRESS TEST N/A 02/13/2009   NORMAL PATTERN OF PERFUSION IN ALL REGIONS. POST STRESS VENTICULAR SIZE IS NORMAL. POST  STESS EF 85%.  NORMAL MYOCARDIAL PERFUSION STUDY.  [name] OPEN REDUCTION INTERNAL FIXATION (ORIF) DISTAL PHALANX Left 11/16/2018   Procedure: MIDDLE FINGER OPEN REDUCTION VERSUS RECONSTRUCTION;  Surgeon: Roseanne Kaufman, MD;  Location: [place];  Service: Orthopedics;  Laterality: Left;  . PACEMAKER INSERTION    . POLYPECTOMY  03/03/2017   Procedure: POLYPECTOMY;  Surgeon: Danie Binder, MD;  [facility];  Service: Endoscopy;;  colon  . TONSILLECTOMY    . YAG LASER APPLICATION Bilateral 4/40/3474   Procedure: YAG LASER APPLICATION;  Surgeon: Williams Che, MD;  Location: [facility];  Service: Ophthalmology;  Laterality: Bilateral;     OB History    Gravida  5   Para  2   Term  1   Preterm  1   AB  3   Living  2     SAB  3   TAB      Ectopic       Multiple      Live Births              Family History  Problem Relation Age of Onset  . Cancer Sister   . Asthma Sister   . Heart failure Brother   . Pulmonary embolism Brother   . Cancer Brother   . Colon cancer Neg Hx     Social History   Tobacco Use  . Smoking status: Former Smoker    Packs/day: 0.50    Years: 30.00    Pack years: 15.00    Types: Cigarettes    Quit date: 12/13/2004    Years since quitting: 14.9  . Smokeless tobacco: Never Used  . Tobacco comment: former smoker  Vaping Use  .  Vaping Use: Never used  Substance Use Topics  . Alcohol use: Yes    Alcohol/week: 0.0 standard drinks    Comment: history of drinking a 1/2 glass of wine in the evening  . Drug use: No    Home Medications Prior to Admission medications   Medication Sig Start Date End Date Taking? Authorizing Provider  acetaminophen (TYLENOL) 325 MG tablet Take 2 tablets (650 mg total) by mouth every 6 (six) hours as needed for mild pain, fever or headache (or Fever >/= 101). 10/17/18  Yes Emokpae, Courage, MD  calcium carbonate (TUMS - DOSED IN MG ELEMENTAL CALCIUM) 500 MG chewable tablet Chew 1 tablet by mouth daily.   Yes [provider]  carvedilol (COREG) 6.25 MG tablet TAKE 1 TABLET BY MOUTH TWO  TIMES DAILY WITH A MEAL Patient taking differently: Take 6.25 mg by mouth 2 (two) times daily with a meal.  06/29/19  Yes Croitoru, Mihai, MD  ELIQUIS 2.5 MG TABS tablet TAKE 1 TABLET BY MOUTH  TWICE DAILY 11/01/19  Yes Croitoru, Mihai, MD  furosemide (LASIX) 40 MG tablet Take 1 tablet (40 mg total) by mouth daily. Patient taking differently: Take 20-40 mg by mouth daily.  09/16/19  Yes Croitoru, Mihai, MD  Multiple Vitamins-Minerals (PRESERVISION AREDS 2) CAPS Take 1 capsule by mouth 2 (two) times daily.    Yes [provider]  Polyethylene Glycol 400 (BLINK TEARS) 0.25 % SOLN Apply 1 drop to eye daily as needed (for dry eye relief).   Yes [provider]  mirtazapine  (REMERON) 7.5 MG tablet Take 1 tablet (7.5 mg total) by mouth at bedtime. Patient not taking: Reported on 11/16/2019 09/06/19   [name], DO  potassium chloride (KLOR-CON) 10 MEQ tablet Take 1 tablet (10 mEq total) by mouth daily. Take only when you take the 40 mg of Lasix. Patient not taking: Reported on 10/09/2019 09/16/19   Croitoru, Dani Gobble, MD  traMADol (ULTRAM) 50 MG tablet Take 1 tablet (50 mg total) by mouth every 6 (six) hours as needed. 11/16/19   Noemi Chapel, MD  traZODone (DESYREL) 150 MG tablet Take 1 tablet (150 mg total) by mouth at bedtime. Patient not taking: Reported on 11/16/2019 08/01/19   Dettinger, Fransisca Kaufmann, MD  zolpidem (AMBIEN) 5 MG tablet Take 1 tablet (5 mg total) by mouth at bedtime as needed for sleep. Patient not taking: Reported on 11/16/2019 10/05/19   Milton Ferguson, MD    Allergies    Ciprofloxacin, Flagyl [metronidazole], Papaya derivatives, Iodine, Penicillins, and Sulfa antibiotics  Review of Systems   Review of Systems  All other systems reviewed and are negative.   Physical Exam Updated Vital Signs BP (!) 119/95   Pulse 60   Temp 97.6 F (36.4 C) (Oral)   Resp 17   Ht 1.727 m (5\' 8" )   Wt 65.8 kg   SpO2 100%   BMI 22.05 kg/m   Physical Exam Vitals and nursing note reviewed.  Constitutional:      General: She is not in acute distress.    Appearance: She is well-developed.  HENT:     Head: Normocephalic and atraumatic.     Mouth/Throat:     Pharynx: No oropharyngeal exudate.  Eyes:     General: No scleral icterus.       Right eye: No discharge.        Left eye: No discharge.     Conjunctiva/sclera: Conjunctivae normal.     Pupils: Pupils are equal, round, and  reactive to light.  Neck:     Thyroid: No thyromegaly.     Vascular: No JVD.  Cardiovascular:     Rate and Rhythm: Normal rate and regular rhythm.     Heart sounds: Normal heart sounds. No murmur heard.  No friction rub. No gallop.   Pulmonary:     Effort: Pulmonary effort  is normal. No respiratory distress.     Breath sounds: Normal breath sounds. No wheezing or rales.  Abdominal:     General: Bowel sounds are normal. There is no distension.     Palpations: Abdomen is soft. There is no mass.     Tenderness: There is no abdominal tenderness.  Musculoskeletal:        General: No tenderness. Normal range of motion.     Cervical back: Normal range of motion and neck supple.  Lymphadenopathy:     Cervical: No cervical adenopathy.  Skin:    General: Skin is warm and dry.     Findings: No erythema or rash.  Neurological:     Mental Status: She is alert.     Coordination: Coordination normal.  Psychiatric:        Behavior: Behavior normal.     ED Results / Procedures / Treatments   Labs (all labs ordered are listed, but only abnormal results are displayed) Labs Reviewed - No data to display  EKG EKG Interpretation  Date/Time:  Wednesday November 16 2019 11:41:56 EDT Ventricular Rate:  70 PR Interval:    QRS Duration: 150 QT Interval:  458 QTC Calculation: 495 R Axis:   86 Text Interpretation: Accelerated junctional rhythm Right bundle branch block LVH by voltage Borderline prolonged QT interval VENTRICULAR PACED RHYTHM Confirmed by Noemi Chapel [id number]) on 11/16/2019 1:06:32 PM   Radiology CT Head Wo Contrast  Result Date: 11/16/2019 CLINICAL DATA:  Acute headache over the last 3 days. EXAM: CT HEAD WITHOUT CONTRAST TECHNIQUE: Contiguous axial images were obtained from the base of the skull through the vertex without intravenous contrast. COMPARISON:  04/04/2018.  10/15/2019. FINDINGS: Brain: There is only mild brain volume loss, less than usually seen at this age. No evidence of old or acute focal infarction, mass lesion, hemorrhage, hydrocephalus or extra-axial collection. Vascular: There is atherosclerotic calcification of the major vessels at the base of the brain. Skull: Negative Sinuses/Orbits: Clear/normal Other: None IMPRESSION: No acute or focal  finding. There is only mild brain volume loss, less than usually seen at this age. Electronically Signed   By: Nelson Chimes M.D.   On: 11/16/2019 13:00    Procedures Procedures (including critical care time)  Medications Ordered in ED Medications  HYDROcodone-acetaminophen (NORCO/VICODIN) 5-325 MG per tablet 2 tablet (2 tablets Oral Given 11/16/19 1458)  ketorolac (TORADOL) injection 60 mg (60 mg Intramuscular Given 11/16/19 1458)    ED Course  I have reviewed the triage vital signs and the nursing notes.  Pertinent labs & imaging results that were available during my care of the patient were reviewed by me and considered in my medical decision making (see chart for details).    MDM Rules/Calculators/A&[name]                          This patient appears overall well, she does have some slight hypertension of 165/78, lung sounds are clear, oxygen is 100%, heart rate is in the 60s consistent with paced rhythm which is what her EKG shows.  Her headache came on acutely and  with her being on an anticoagulant there is certainly concern for subarachnoid.  Will obtain a CT scan of the brain without contrast and treat her pain.  The patient refuses a CT scan with contrast because she does not want to have a peripheral IV placed.  After discussing this with her she absolutely refused any IV, thus we cannot further clarify the anatomy of her brain however given her very alert mental status, normal neurologic exam and the fact that she is now telling me that this is much better when she is massaging her posterior occipital area I suspect that she has more of an occipital neuralgia.  Thankfully she can be treated outpatient, she has been given medications for supportive care and pain control, her vital signs have remained normal, current blood pressure is 119/95, pulse of 60, afebrile, still well-appearing without significant complaint other than her headache which she states is only slightly better with  medications including Toradol and oral opiates.  She understands indications for return, she will be given some medication for home, she is stable for discharge at this time  Final Clinical Impression(s) / ED Diagnoses Final diagnoses:  Occipital neuralgia, unspecified laterality    Rx / DC Orders ED Discharge Orders         Ordered    traMADol (ULTRAM) 50 MG tablet  Every 6 hours PRN     Discontinue  Reprint     11/16/19 1537           Noemi Chapel, MD 11/16/19 1541

## 2019-11-16 NOTE — Telephone Encounter (Addendum)
Pt called to report that she woke up at 4 am with SOB and some chest pressure.. she says she is still SOB today. She is home alone and cannot have anyone bring her to the office or the ED so she plans to call EMS to come and look at her. Pt says the pressure keeps coming and going and hurts when she takes a deep breath. She has also said that she had a headache for the last few days and has been severe. She denies weakness and does not have slurred speech.    Pt is going to call EMS to assess her.

## 2019-11-16 NOTE — ED Triage Notes (Signed)
Pt c/o shortness of breath , tightness in her chest and headache for past 3 days

## 2019-11-16 NOTE — ED Notes (Signed)
EMS notified to take pt home.

## 2019-11-20 ENCOUNTER — Other Ambulatory Visit: Payer: Self-pay | Admitting: Family Medicine

## 2019-11-21 ENCOUNTER — Other Ambulatory Visit: Payer: Self-pay

## 2019-11-21 ENCOUNTER — Ambulatory Visit (HOSPITAL_COMMUNITY)
Admission: RE | Admit: 2019-11-21 | Discharge: 2019-11-21 | Disposition: A | Discharge status: Home / Self Care | Payer: Medicare HMO | Source: Ambulatory Visit | Attending: Physician Assistant | Admitting: Physician Assistant

## 2019-11-21 DIAGNOSIS — R609 Edema, unspecified: Secondary | ICD-10-CM | POA: Diagnosis not present

## 2019-11-21 DIAGNOSIS — I428 Other cardiomyopathies: Secondary | ICD-10-CM | POA: Insufficient documentation

## 2019-11-21 DIAGNOSIS — I361 Nonrheumatic tricuspid (valve) insufficiency: Secondary | ICD-10-CM

## 2019-11-21 DIAGNOSIS — I509 Heart failure, unspecified: Secondary | ICD-10-CM | POA: Insufficient documentation

## 2019-11-21 DIAGNOSIS — I340 Nonrheumatic mitral (valve) insufficiency: Secondary | ICD-10-CM

## 2019-11-21 DIAGNOSIS — N189 Chronic kidney disease, unspecified: Secondary | ICD-10-CM | POA: Insufficient documentation

## 2019-11-21 DIAGNOSIS — Z87891 Personal history of nicotine dependence: Secondary | ICD-10-CM | POA: Insufficient documentation

## 2019-11-21 DIAGNOSIS — Z950 Presence of cardiac pacemaker: Secondary | ICD-10-CM | POA: Diagnosis not present

## 2019-11-21 DIAGNOSIS — Z8249 Family history of ischemic heart disease and other diseases of the circulatory system: Secondary | ICD-10-CM | POA: Insufficient documentation

## 2019-11-21 DIAGNOSIS — R0600 Dyspnea, unspecified: Secondary | ICD-10-CM | POA: Diagnosis not present

## 2019-11-21 DIAGNOSIS — I1310 Hypertensive heart and chronic kidney disease without heart failure, with stage 1 through stage 4 chronic kidney disease, or unspecified chronic kidney disease: Secondary | ICD-10-CM | POA: Insufficient documentation

## 2019-11-21 DIAGNOSIS — I4891 Unspecified atrial fibrillation: Secondary | ICD-10-CM | POA: Diagnosis not present

## 2019-11-21 LAB — ECHOCARDIOGRAM COMPLETE
Area-P 1/2: 4.39 cm2
MV M vel: 4.94 m/s
MV Peak grad: 97.6 mmHg
S' Lateral: 2.4 cm

## 2019-12-01 ENCOUNTER — Ambulatory Visit (INDEPENDENT_AMBULATORY_CARE_PROVIDER_SITE_OTHER): Payer: Medicare Other | Admitting: *Deleted

## 2019-12-01 DIAGNOSIS — I428 Other cardiomyopathies: Secondary | ICD-10-CM

## 2019-12-01 LAB — CUP PACEART REMOTE DEVICE CHECK
Battery Remaining Longevity: 5 mo
Battery Voltage: 2.83 V
Brady Statistic AP VP Percent: 0 %
Brady Statistic AP VS Percent: 0 %
Brady Statistic AS VP Percent: 93.93 %
Brady Statistic AS VS Percent: 6.07 %
Brady Statistic RA Percent Paced: 0 %
Brady Statistic RV Percent Paced: 99.26 %
Date Time Interrogation Session: 20210729212504
Implantable Lead Implant Date: 19971124
Implantable Lead Implant Date: 20070831
Implantable Lead Location: 753858
Implantable Lead Location: 753860
Implantable Lead Model: 4024
Implantable Lead Model: 4194
Implantable Pulse Generator Implant Date: 20120619
Lead Channel Impedance Value: 4047 Ohm
Lead Channel Impedance Value: 4047 Ohm
Lead Channel Impedance Value: 456 Ohm
Lead Channel Impedance Value: 456 Ohm
Lead Channel Impedance Value: 494 Ohm
Lead Channel Impedance Value: 513 Ohm
Lead Channel Impedance Value: 646 Ohm
Lead Channel Impedance Value: 703 Ohm
Lead Channel Impedance Value: 893 Ohm
Lead Channel Pacing Threshold Amplitude: 0.75 V
Lead Channel Pacing Threshold Amplitude: 1.5 V
Lead Channel Pacing Threshold Pulse Width: 0.4 ms
Lead Channel Pacing Threshold Pulse Width: 1 ms
Lead Channel Sensing Intrinsic Amplitude: 8.875 mV
Lead Channel Sensing Intrinsic Amplitude: 8.875 mV
Lead Channel Setting Pacing Amplitude: 2 V
Lead Channel Setting Pacing Amplitude: 2.5 V
Lead Channel Setting Pacing Pulse Width: 0.4 ms
Lead Channel Setting Pacing Pulse Width: 1 ms
Lead Channel Setting Sensing Sensitivity: 2.8 mV

## 2019-12-05 NOTE — Progress Notes (Signed)
Remote pacemaker transmission.   

## 2019-12-06 DIAGNOSIS — Z594 Lack of adequate food and safe drinking water: Secondary | ICD-10-CM | POA: Diagnosis not present

## 2019-12-06 DIAGNOSIS — E559 Vitamin D deficiency, unspecified: Secondary | ICD-10-CM | POA: Diagnosis not present

## 2019-12-06 DIAGNOSIS — D510 Vitamin B12 deficiency anemia due to intrinsic factor deficiency: Secondary | ICD-10-CM | POA: Diagnosis not present

## 2019-12-06 DIAGNOSIS — Z604 Social exclusion and rejection: Secondary | ICD-10-CM | POA: Diagnosis not present

## 2019-12-06 DIAGNOSIS — Z6825 Body mass index (BMI) 25.0-25.9, adult: Secondary | ICD-10-CM | POA: Diagnosis not present

## 2019-12-06 DIAGNOSIS — Z950 Presence of cardiac pacemaker: Secondary | ICD-10-CM | POA: Diagnosis not present

## 2019-12-06 DIAGNOSIS — F339 Major depressive disorder, recurrent, unspecified: Secondary | ICD-10-CM | POA: Diagnosis not present

## 2019-12-09 ENCOUNTER — Ambulatory Visit (INDEPENDENT_AMBULATORY_CARE_PROVIDER_SITE_OTHER): Payer: Medicare HMO | Admitting: Physician Assistant

## 2019-12-09 ENCOUNTER — Other Ambulatory Visit: Payer: Self-pay

## 2019-12-09 ENCOUNTER — Encounter: Payer: Self-pay | Admitting: Physician Assistant

## 2019-12-09 VITALS — BP 128/82 | HR 67 | Ht 68.0 in | Wt 145.0 lb

## 2019-12-09 DIAGNOSIS — I5032 Chronic diastolic (congestive) heart failure: Secondary | ICD-10-CM

## 2019-12-09 DIAGNOSIS — I4821 Permanent atrial fibrillation: Secondary | ICD-10-CM | POA: Diagnosis not present

## 2019-12-09 NOTE — Progress Notes (Signed)
Cardiology Office Note   Date:  12/09/2019   ID:  Kimberly [name], DOB [birth year], MRN [id number]  PCP:  Patient, No Pcp Per [facility] in [place] Cardiologist:  Sanda Klein, MD 12/27/2018 Electrphysiologist: None Rosaria Ferries, PA-C   No chief complaint on file.   History of Present Illness: Kimberly [name] is a 86 y.o. female with a history of permanent atrial fibrillation with AV node ablation and secondary complete heart block here for follow-up on her MDT CRT-P device. She has mild diastolic heart failure. Non-obs dz at cath 2007 w/ EF 15-20%>>55-60% now.  ER visit 07/14 for HA and c/o SOB as well. She adamantly refused an IV, so no CT, but pain got better w/ oral meds, d/c home.   Kimberly [name] presents for cardiology follow up.  She is with a Education officer, museum.   She is tired. This is not new, a problem she has this intermittently.   No palpitations, doesn't really feel the Afib.  No new SOB, has chronic DOE. No LE edema, no wt gain or loss. Activity level is not very high.   She has not had chest pain.   She ambulated in the hall and c/o SOB after about 30 feet. However, O2 sats were 97% or greater the whole time.   In general, she is cardiac stable.    Past Medical History:  Diagnosis Date  . Acute blood loss anemia 10/11/2012  . Acute diverticulitis 08/24/2013  . Acute on chronic combined systolic and diastolic CHF, NYHA class 4 ([place]) 11/15/2013  . Antral ulcer 10/11/2012  . Arrhythmia    atrial fibb  . Atrial fibrillation ([place])   . Bipolar affective disorder ([place]) 03/23/2017  . Cardiomyopathy, nonischemic ([place])   . Chronic anticoagulation 10/12/2012  . CKD (chronic kidney disease) stage 3, GFR 30-59 ml/min 10/12/2012  . Depression   . Erosive esophagitis 10/11/2012  . Fibromyalgia   . Glaucoma   . H/O echocardiogram 2007   EF 40-45%,         . Hypertension   . Osteoarthritis   . Pacemaker    Last saw cards 07/2013  . Scoliosis     Past Surgical History:   Procedure Laterality Date  . ABDOMINAL HYSTERECTOMY    . APPENDECTOMY    . BACK SURGERY    . BIOPSY  03/03/2017   Procedure: BIOPSY;  Surgeon: Danie Binder, MD;  [facility];  Service: Endoscopy;;  gastric  . BREAST SURGERY    . CARDIAC CATHETERIZATION  12/08/2005   LAD AND LEFT MAIN WITH NO HIGH-GRADE STENOSIS. MILD DISEASE IN THE CX AND LAD SYSTEM. SEVERE LV DYSFUNCTION WITH DILATION OF THE LV. EF 15-20%. LV END-DIASTOLIC PRESSURE IS 90. +1 MR.  . CHOLECYSTECTOMY    . COLONOSCOPY N/A 03/03/2017   Procedure: COLONOSCOPY;  Surgeon: Danie Binder, MD;  Location: [facility];  Service: Endoscopy;  Laterality: N/A;  . CYSTOSCOPY N/A 02/24/2013   Procedure: CYSTOSCOPY WITH URETHRAL DILITATION;  Surgeon: Marissa Nestle, MD;  Location: [facility];  Service: Urology;  Laterality: N/A;  . DOPPLER ECHOCARDIOGRAPHY N/A 05/30/2010   LV SIZE IS NORMAL. LV SYSTOLIC FUNCTION IS LOW NORMAL. EF=50-55%. MILD INFERIOR HYPOKINESIS.MILD TO MODERATE POSTERIOR WALL HYPOKINESIS.PACEMAKER LEAD IN THE RV. LA IS MILDLY DILATED. RA IS MODERATE TO SEVERLY DILATED. PACEMAKER LEAD IN THE RA. MILD CALCICICATION OF THE MV APPARATUS. MODERATE MR. MILD TO MODERATE TR. MILD PHTN.AV MILDLY SCLEROTIC.  [name] ESOPHAGOGASTRODUODENOSCOPY N/A 10/13/2012  Dr. Gala Romney: severe ulcerative reflux esophagitis, question of Honesty Menta's but negative path, single deep prepyloric antral ulcer, negative H.pylori  . ESOPHAGOGASTRODUODENOSCOPY N/A 03/03/2017   Procedure: ESOPHAGOGASTRODUODENOSCOPY (EGD);  Surgeon: Danie Binder, MD;  [facility];  Service: Endoscopy;  Laterality: N/A;  . HERNIA REPAIR     right inguinal hernia and umbilical  . LOWETR EXT VENOUS Bilateral 11-08-10   R & L- NO EVIDENCE OF THROMBUS OR THROMBOPHLEBITIS. THERE IS MILD AMOUNT OF SUBCUTANEOUS EDEMA NOTED WITHIN THE LEFT CALF AND ANKLE. R & L GSV AND SSV- NO VENOUS INSUFF NOTED.  [name] NECK SURGERY    . NUCLEAR STRESS TEST N/A 02/13/2009   NORMAL  PATTERN OF PERFUSION IN ALL REGIONS. POST STRESS VENTICULAR SIZE IS NORMAL. POST  STESS EF 85%.  NORMAL MYOCARDIAL PERFUSION STUDY.  [name] OPEN REDUCTION INTERNAL FIXATION (ORIF) DISTAL PHALANX Left 11/16/2018   Procedure: MIDDLE FINGER OPEN REDUCTION VERSUS RECONSTRUCTION;  Surgeon: Roseanne Kaufman, MD;  Location: [place];  Service: Orthopedics;  Laterality: Left;  . PACEMAKER INSERTION    . POLYPECTOMY  03/03/2017   Procedure: POLYPECTOMY;  Surgeon: Danie Binder, MD;  [facility];  Service: Endoscopy;;  colon  . TONSILLECTOMY    . YAG LASER APPLICATION Bilateral 6/60/6301   Procedure: YAG LASER APPLICATION;  Surgeon: Williams Che, MD;  Location: [facility];  Service: Ophthalmology;  Laterality: Bilateral;    Current Outpatient Medications  Medication Sig Dispense Refill  . acetaminophen (TYLENOL) 325 MG tablet Take 2 tablets (650 mg total) by mouth every 6 (six) hours as needed for mild pain, fever or headache (or Fever >/= 101). 12 tablet 0  . calcium carbonate (TUMS - DOSED IN MG ELEMENTAL CALCIUM) 500 MG chewable tablet Chew 1 tablet by mouth daily.    . carvedilol (COREG) 6.25 MG tablet TAKE 1 TABLET BY MOUTH  TWICE DAILY WITH A MEAL 180 tablet 1  . ELIQUIS 2.5 MG TABS tablet TAKE 1 TABLET BY MOUTH  TWICE DAILY 180 tablet 1  . furosemide (LASIX) 40 MG tablet Take 1 tablet (40 mg total) by mouth daily. (Patient taking differently: Take 20-40 mg by mouth daily. ) 30 tablet 3  . mirtazapine (REMERON) 7.5 MG tablet Take 1 tablet (7.5 mg total) by mouth at bedtime. 90 tablet 0  . Multiple Vitamins-Minerals (PRESERVISION AREDS 2) CAPS Take 1 capsule by mouth 2 (two) times daily.     . Polyethylene Glycol 400 (BLINK TEARS) 0.25 % SOLN Apply 1 drop to eye daily as needed (for dry eye relief).    . potassium chloride (KLOR-CON) 10 MEQ tablet Take 1 tablet (10 mEq total) by mouth daily. Take only when you take the 40 mg of Lasix. 30 tablet 2  . traMADol (ULTRAM) 50 MG tablet Take 1 tablet  (50 mg total) by mouth every 6 (six) hours as needed. 8 tablet 0  . traZODone (DESYREL) 150 MG tablet Take 1 tablet (150 mg total) by mouth at bedtime. 30 tablet 3  . zolpidem (AMBIEN) 5 MG tablet Take 1 tablet (5 mg total) by mouth at bedtime as needed for sleep. 10 tablet 0   No current facility-administered medications for this visit.    Allergies:   Ciprofloxacin, Flagyl [metronidazole], Papaya derivatives, Iodine, Penicillins, and Sulfa antibiotics    Social History:  The patient  reports that she quit smoking about 14 years ago. Her smoking use included cigarettes. She has a 15.00 pack-year smoking history. She has never used smokeless tobacco.  She reports current alcohol use. She reports that she does not use drugs.   Family History:  The patient's family history includes Asthma in her sister; Cancer in her brother and sister; Heart failure in her brother; Pulmonary embolism in her brother.  She indicated that her mother is deceased. She indicated that her father is deceased. She indicated that both of her sisters are deceased. She indicated that all of her four brothers are deceased. She indicated that the status of her neg hx is unknown.    ROS:  Please see the history of present illness. All other systems are reviewed and negative.    PHYSICAL EXAM: VS:  BP 128/82   Pulse 67   Ht 5\' 8"  (1.727 m)   Wt 145 lb (65.8 kg)   SpO2 97%   BMI 22.05 kg/m  , BMI Body mass index is 22.05 kg/m. GEN: Well nourished, well developed, female in no acute distress HEENT: normal for age  Neck: no JVD, no carotid bruit, no masses Cardiac: RRR; no murmur, no rubs, or gallops Respiratory:  clear to auscultation bilaterally, normal work of breathing GI: soft, nontender, nondistended, + BS MS: no deformity or atrophy; no edema; distal pulses are 2+ in all 4 extremities  Skin: warm and dry, no rash Neuro:  Strength and sensation are intact Psych: euthymic mood, full affect   EKG:  EKG is not  ordered today. 07/14 ECG is Afib, V pacing  ECHO: 11/21/2019 1. Left ventricular ejection fraction, by estimation, is 55 to 60%. The  left ventricle has normal function. The left ventricle has no regional  wall motion abnormalities. Left ventricular diastolic function could not  be evaluated.  2. Right ventricular systolic function is normal. The right ventricular  size is normal. There is mildly elevated pulmonary artery systolic  pressure. The estimated right ventricular systolic pressure is 74.2 mmHg.  3. Left atrial size was severely dilated.  4. Right atrial size was severely dilated.  5. The mitral valve is degenerative. Mild mitral valve regurgitation. No  evidence of mitral stenosis.  6. The aortic valve is tricuspid. Aortic valve regurgitation is not  visualized. Mild to moderate aortic valve sclerosis/calcification is  present, without any evidence of aortic stenosis.  7. The inferior vena cava is normal in size with greater than 50%  respiratory variability, suggesting right atrial pressure of 3 mmHg.   Comparison(s): No significant change from prior study. Prior EF 60-65%  (2016).   CATH: 12/08/2005 RESULTS:  1.  Hemodynamic monitoring: The central aortic pressure was 130/70.  Left      ventricular pressure was 130/14.  There was no aortic valve gradient at      the time of pullback.  2.  Ventriculography.  Ventriculography in the RAO projection using 20 mL of      contrast revealed the left ventricle to be severely dilated.  There was      severe global dysfunction with an ejection fraction around 15-20%.  The      left ventricular end-diastolic pressure was 19.  There was +1 mitral      regurgitation.  There was a ventriculogram performed with the pigtail      catheter right underneath the aortic valve, and, when it was injected,,      the pigtail catheter was pulled back into the aorta, and aortic root      revealed no significant aortic insufficiency and an  appropriate diameter      aortic root.  CORONARY ARTERIOGRAPHY:  There was dense calcification in the proximal  portion of the left main, the proximal third of the LAD, and also at the  bifurcation of the LAD and the diagonal.  1.  Left main normal. It bifurcated.  2.  LAD.  The LAD crossed the apex of the heart.  In the mid portion of the      LAD at the bifurcation of the diagonal was an area of 30% narrowing in      the LAD itself.  There were minimal irregularities in the proximal      portion where the more dense calcification was seen.  The diagonal      itself was a moderate-size vessel without significant irregularities.  3.  Circumflex.  The circumflex gave rise to a large OM vessel and      bifurcated.  At the bifurcation of the circumflex was an area of 30-40%      narrowing.  The ongoing circumflex right at the bifurcation had an area      of 40% narrowing.  4.  Right coronary artery. The right coronary had no significant disease.      The PDA and small posterolateral branch were seen, and they were free of      disease also.   CONCLUSION:  1.  Dense calcification of the left anterior descending and left main with      no high-grade stenosis.  Mild disease in the circumflex and left      anterior descending system.  2.  Severe left ventricular dysfunction with dilatation of the left      ventricle.  3.  Ejection fraction approximately 15 to 20%.  4.  Left ventricular end-diastolic pressure of 90.  5.  +1 mitral regurgitation.   I have the Tenormin and placed her on Coreg 20 mg.  I also placed her on  lisinopril 10 mg.  This is all because of her LV dysfunction.  Recent Labs: 10/02/2019: B Natriuretic Peptide 232.0 10/09/2019: ALT 19; BUN 40; Creatinine, Ser 1.25; Hemoglobin 11.4; Platelets 190; Potassium 4.1; Sodium 139  CBC    Component Value Date/Time   WBC 6.9 10/09/2019 1907   RBC 3.42 (L) 10/09/2019 1907   HGB 11.4 (L) 10/09/2019 1907   HGB 13.5 03/15/2018  1645   HCT 35.7 (L) 10/09/2019 1907   HCT 39.9 03/15/2018 1645   PLT 190 10/09/2019 1907   PLT 176 03/15/2018 1645   MCV 104.4 (H) 10/09/2019 1907   MCV 101 (H) 03/15/2018 1645   MCH 33.3 10/09/2019 1907   MCHC 31.9 10/09/2019 1907   RDW 13.5 10/09/2019 1907   RDW 13.0 03/15/2018 1645   LYMPHSABS 1.5 10/02/2019 1724   LYMPHSABS 1.3 03/15/2018 1645   MONOABS 0.7 10/02/2019 1724   EOSABS 0.2 10/02/2019 1724   EOSABS 0.1 03/15/2018 1645   BASOSABS 0.0 10/02/2019 1724   BASOSABS 0.0 03/15/2018 1645   CMP Latest Ref Rng & Units 10/09/2019 10/02/2019 09/24/2019  Glucose 70 - 99 mg/dL 147(H) 109(H) 98  BUN 8 - 23 mg/dL 40(H) 43(H) 39(H)  Creatinine 0.44 - 1.00 mg/dL 1.25(H) 1.48(H) 1.44(H)  Sodium 135 - 145 mmol/L 139 138 139  Potassium 3.5 - 5.1 mmol/L 4.1 4.6 3.9  Chloride 98 - 111 mmol/L 108 104 104  CO2 22 - 32 mmol/L 20(L) 25 22  Calcium 8.9 - 10.3 mg/dL 8.6(L) 8.9 9.0  Total Protein 6.5 - 8.1 g/dL 7.0 6.8 7.0  Total Bilirubin 0.3 - 1.2  mg/dL 0.4 0.6 0.7  Alkaline Phos 38 - 126 U/L 56 52 58  AST 15 - 41 U/L 26 23 22   ALT 0 - 44 U/L 19 13 15      Lipid Panel Lab Results  Component Value Date   CHOL 179 03/15/2018   HDL 84 03/15/2018   LDLCALC 82 03/15/2018   TRIG 65 03/15/2018   CHOLHDL 2.1 03/15/2018      Wt Readings from Last 3 Encounters:  12/09/19 145 lb (65.8 kg)  11/16/19 145 lb (65.8 kg)  10/09/19 152 lb (68.9 kg)     Other studies Reviewed: Additional studies/ records that were reviewed today include: Office notes, hospital records and testing.  ASSESSMENT AND PLAN:  1.  Chronic diastolic CHF - Her volume status is at baseline, wt stable - her DOE is chronic and has not changed any recently - Reviewed her Optivol w/ Dr Sallyanne Kuster, she was volume overloaded recently but that was improving when device was downloaded 07/29 - no med changes at this time  2. Afib, CHB s/p MDT CRT-P - in ER, she was in Afib w/ V pacing - no sx from Afib, no tachycardia - 94%  BiV pacing - continue downloads as scheduled  Current medicines are reviewed at length with the patient today.  The patient does not have concerns regarding medicines.  The following changes have been made:  no change  Labs/ tests ordered today include:  No orders of the defined types were placed in this encounter.   Disposition:   FU with Sanda Klein, MD  Signed, Rosaria Ferries, PA-C  12/09/2019 11:47 AM    [place] Phone: [phone]; Fax: [phone]

## 2019-12-09 NOTE — Patient Instructions (Signed)
Medication Instructions:  No changes *If you need a refill on your cardiac medications before your next appointment, please call your pharmacy*   Lab Work: No labs If you have labs (blood work) drawn today and your tests are completely normal, you will receive your results only by: [name] MyChart Message (if you have MyChart) OR . A paper copy in the mail If you have any lab test that is abnormal or we need to change your treatment, we will call you to review the results.   Testing/Procedures: None    Follow-Up: At [facility], you and your health needs are our priority.  As part of our continuing mission to provide you with exceptional heart care, we have created designated Provider Care Teams.  These Care Teams include your primary Cardiologist (physician) and Advanced Practice Providers (APPs -  Physician Assistants and Nurse Practitioners) who all work together to provide you with the care you need, when you need it.  We recommend signing up for the patient portal called "MyChart".  Sign up information is provided on this After Visit Summary.  MyChart is used to connect with patients for Virtual Visits (Telemedicine).  Patients are able to view lab/test results, encounter notes, upcoming appointments, etc.  Non-urgent messages can be sent to your provider as well.   To learn more about what you can do with MyChart, go to [email or web address].    Your next appointment:   3 month(s)  The format for your next appointment:   In Person  Provider:   Sanda Klein, MD

## 2019-12-10 ENCOUNTER — Encounter: Payer: Self-pay | Admitting: Physician Assistant

## 2019-12-14 ENCOUNTER — Ambulatory Visit: Payer: Medicare HMO | Admitting: Gastroenterology

## 2019-12-26 DIAGNOSIS — Z594 Lack of adequate food and safe drinking water: Secondary | ICD-10-CM | POA: Diagnosis not present

## 2019-12-26 DIAGNOSIS — Z950 Presence of cardiac pacemaker: Secondary | ICD-10-CM | POA: Diagnosis not present

## 2019-12-26 DIAGNOSIS — I5032 Chronic diastolic (congestive) heart failure: Secondary | ICD-10-CM | POA: Diagnosis not present

## 2019-12-26 DIAGNOSIS — N183 Chronic kidney disease, stage 3 unspecified: Secondary | ICD-10-CM | POA: Diagnosis not present

## 2019-12-26 DIAGNOSIS — Z1389 Encounter for screening for other disorder: Secondary | ICD-10-CM | POA: Diagnosis not present

## 2019-12-26 DIAGNOSIS — I442 Atrioventricular block, complete: Secondary | ICD-10-CM | POA: Diagnosis not present

## 2019-12-26 DIAGNOSIS — Z1331 Encounter for screening for depression: Secondary | ICD-10-CM | POA: Diagnosis not present

## 2019-12-26 DIAGNOSIS — E559 Vitamin D deficiency, unspecified: Secondary | ICD-10-CM | POA: Diagnosis not present

## 2020-01-02 ENCOUNTER — Encounter: Payer: Medicare Other | Admitting: Cardiovascular Disease

## 2020-01-17 ENCOUNTER — Telehealth: Payer: Self-pay | Admitting: Cardiovascular Disease

## 2020-01-17 ENCOUNTER — Other Ambulatory Visit: Payer: Self-pay

## 2020-01-17 MED ORDER — FUROSEMIDE 40 MG PO TABS: 20.0000 mg | ORAL_TABLET | Freq: Every day | ORAL | 0 refills | Status: DC

## 2020-01-17 NOTE — Telephone Encounter (Signed)
*  STAT* If patient is at the pharmacy, call can be transferred to refill team.   1. Which medications need to be refilled? (please list name of each medication and dose if known) Furosemide  2. Which pharmacy/location (including street and city if local pharmacy) is medication to be sent to? [facility]  3. Do they need a 30 day or 90 day supply?  90 days and refills

## 2020-01-17 NOTE — Telephone Encounter (Signed)
furosemide (LASIX) 40 MG tablet 90 tablet 0 01/17/2020    Sig - Route: Take 0.5-1 tablets (20-40 mg total) by mouth daily. - Oral   Sent to pharmacy as: furosemide (LASIX) 40 MG tablet   Notes to Pharmacy: PLEASE DELIVER... DOSE CHANGE   E-Prescribing Status: Receipt confirmed by pharmacy (01/17/2020  2:00 PM EDT)   Pharmacy  [facility], [place]

## 2020-01-23 DIAGNOSIS — H0102A Squamous blepharitis right eye, upper and lower eyelids: Secondary | ICD-10-CM | POA: Diagnosis not present

## 2020-01-23 DIAGNOSIS — Z961 Presence of intraocular lens: Secondary | ICD-10-CM | POA: Diagnosis not present

## 2020-01-23 DIAGNOSIS — H0102B Squamous blepharitis left eye, upper and lower eyelids: Secondary | ICD-10-CM | POA: Diagnosis not present

## 2020-01-23 DIAGNOSIS — H04123 Dry eye syndrome of bilateral lacrimal glands: Secondary | ICD-10-CM | POA: Diagnosis not present

## 2020-01-23 DIAGNOSIS — H353132 Nonexudative age-related macular degeneration, bilateral, intermediate dry stage: Secondary | ICD-10-CM | POA: Diagnosis not present

## 2020-01-24 ENCOUNTER — Ambulatory Visit (INDEPENDENT_AMBULATORY_CARE_PROVIDER_SITE_OTHER): Payer: Medicare HMO | Admitting: Gastroenterology

## 2020-01-24 ENCOUNTER — Encounter: Payer: Self-pay | Admitting: Gastroenterology

## 2020-01-24 ENCOUNTER — Other Ambulatory Visit: Payer: Self-pay

## 2020-01-24 VITALS — BP 144/72 | HR 88 | Temp 97.1°F | Ht 64.0 in | Wt 148.8 lb

## 2020-01-24 DIAGNOSIS — D649 Anemia, unspecified: Secondary | ICD-10-CM | POA: Diagnosis not present

## 2020-01-24 DIAGNOSIS — R109 Unspecified abdominal pain: Secondary | ICD-10-CM | POA: Diagnosis not present

## 2020-01-24 NOTE — Progress Notes (Signed)
Cc'ed to [place]

## 2020-01-24 NOTE — Progress Notes (Signed)
No pcp per patient 

## 2020-01-24 NOTE — Progress Notes (Signed)
Primary Care Physician: [place]  Primary Gastroenterologist:  Elon Alas. Abbey Chatters, DO   Chief Complaint  Patient presents with  . Abdominal Pain    RUQ, comes/goes, doesn't hurt all the time    HPI: Kimberly [name] is a 86 y.o. female here for follow-up.  Last seen in June 2020 during hospitalization for rectal bleeding on Eliquis and ibuprofen.  CT showed diverticulitis, patient completed antibiotics.  Patient has history of chronic right lower quadrant pain for years felt to be musculoskeletal in the past.  Last colonoscopy/EGD October 2018 Gastritis/duodenitis (no H. pylori), 5 colon polyps (adenomatous) removed, diverticulosis.  99% colon was cleared, cecum was not adequately seen.  Patient presents with her social worker, Scientist, research (life sciences).  Neither one of them really know why she is here today.  Social worker states she had a list of appointments that she was supposed to follow through with when they started assisting her several months ago.  Patient has had several ED evaluations at [facility] in the past few months for right-sided abdominal pain, with recommendations to follow-up with GI.  She has had chronic right-sided abdominal pain for years with extensive evaluation, suspect musculoskeletal component in the setting of severe dextroscoliosis of thoracic spine.  Most recent CT abdomen pelvis without contrast October 09 2019 showed moderate hiatal hernia, sigmoid diverticulosis, 4 mm nonobstructing right renal calculus but nothing to explain her pain.  CT abdomen pelvis without contrast Sep 24 2019 with no explanation for right-sided abdominal pain.  Labs from June 2021 during ED evaluation, hemoglobin 11.4, hematocrit 35.4, MCV 104.4.  Hemoglobin was 11.5 back in May 2021. Lipase mildly elevated at 60, glucose 147, BUN 40, creatinine 1.25, LFTs normal.  Seen in the ED at [facility] several times as well.  CT head without contrast October 15, 2019 showed chronic white matter  ischemic changes without acute abnormality.  CT abdomen pelvis with contrast June 12 showed common bile duct of 11 mm status post cholecystectomy, diverticulosis of the colon, small to moderate hiatal hernia.  Radiologist recommended no further evaluation of common bile duct dilation if LFTs normal given previous cholecystectomy and patient's age.  She had a CT abdomen pelvis without contrast October 12, 2019 at [facility] as well with similar findings.  October 19, 2019 labs with hemoglobin of 10, MCV 100.7, white blood cell count 6000, platelets 184,000, LFTs normal, BUN 29, creatinine 1.49  Today patient states she has chronic intermittent right-sided abdominal pain like she has had for years.  Denies any worsening of her symptoms.  No associated vomiting.  Pain not worse with meals.  Worse with movement.  Bowel movements regular.  No blood in the stool or melena.  No heartburn.  She denies weight loss.  She takes care of her self at home.  Lives by herself.   Current Outpatient Medications  Medication Sig Dispense Refill  . acetaminophen (TYLENOL) 325 MG tablet Take 2 tablets (650 mg total) by mouth every 6 (six) hours as needed for mild pain, fever or headache (or Fever >/= 101). 12 tablet 0  . calcium carbonate (TUMS - DOSED IN MG ELEMENTAL CALCIUM) 500 MG chewable tablet Chew 1 tablet by mouth daily.    . carvedilol (COREG) 6.25 MG tablet TAKE 1 TABLET BY MOUTH  TWICE DAILY WITH A MEAL 180 tablet 1  . ELIQUIS 2.5 MG TABS tablet TAKE 1 TABLET BY MOUTH  TWICE DAILY 180 tablet 1  . furosemide (LASIX) 40  MG tablet Take 0.5-1 tablets (20-40 mg total) by mouth daily. 90 tablet 0  . Multiple Vitamins-Minerals (PRESERVISION AREDS 2) CAPS Take 1 capsule by mouth 2 (two) times daily.     . Polyethylene Glycol 400 (BLINK TEARS) 0.25 % SOLN Apply 1 drop to eye daily as needed (for dry eye relief).    . potassium chloride (KLOR-CON) 10 MEQ tablet Take 1 tablet (10 mEq total) by mouth daily. Take only when you  take the 40 mg of Lasix. 30 tablet 2  . traMADol (ULTRAM) 50 MG tablet Take 1 tablet (50 mg total) by mouth every 6 (six) hours as needed. 8 tablet 0   No current facility-administered medications for this visit.    Allergies as of 01/24/2020 - Review Complete 01/24/2020  Allergen Reaction Noted  . Ciprofloxacin Other (See Comments) 03/21/2017  . Flagyl [metronidazole] Other (See Comments) 03/21/2017  . Papaya derivatives Hives 01/22/2011  . Iodine Rash and Other (See Comments) 01/22/2011  . Penicillins Hives 01/22/2011  . Sulfa antibiotics Rash 01/22/2011    ROS:  General: Negative for anorexia, weight loss, fever, chills, fatigue, weakness. ENT: Negative for hoarseness, difficulty swallowing , nasal congestion. CV: Negative for chest pain, angina, palpitations, dyspnea on exertion, peripheral edema.  Respiratory: Negative for dyspnea at rest, dyspnea on exertion, cough, sputum, wheezing.  GI: See history of present illness. GU:  Negative for dysuria, hematuria, urinary incontinence, urinary frequency, nocturnal urination.  Endo: Negative for unusual weight change.    Physical Examination:   BP (!) 144/72   Pulse 88   Temp (!) 97.1 F (36.2 C) (Oral)   Ht 5\' 4"  (1.626 m)   Wt 148 lb 12.8 oz (67.5 kg)   BMI 25.54 kg/m   General: Well-nourished, well-developed in no acute distress.  Eyes: No icterus. Mouth: Oropharyngeal mucosa moist and pink , no lesions erythema or exudate. Lungs: Clear to auscultation bilaterally.  Heart: Regular rate and rhythm, no murmurs rubs or gallops.  Abdomen: Bowel sounds are normal, nontender, nondistended, no hepatosplenomegaly or masses, no abdominal bruits or hernia , no rebound or guarding.   Extremities: No lower extremity edema. No clubbing or deformities. Neuro: Alert and oriented x 4   Skin: Warm and dry, no jaundice.   Psych: Alert and cooperative, normal mood and affect.  Labs:  Lab Results  Component Value Date   CREATININE  1.25 (H) 10/09/2019   BUN 40 (H) 10/09/2019   NA 139 10/09/2019   K 4.1 10/09/2019   CL 108 10/09/2019   CO2 20 (L) 10/09/2019   Lab Results  Component Value Date   WBC 6.9 10/09/2019   HGB 11.4 (L) 10/09/2019   HCT 35.7 (L) 10/09/2019   MCV 104.4 (H) 10/09/2019   PLT 190 10/09/2019   Lab Results  Component Value Date   ALT 19 10/09/2019   AST 26 10/09/2019   ALKPHOS 56 10/09/2019   BILITOT 0.4 10/09/2019   Lab Results  Component Value Date   LIPASE 60 (H) 10/09/2019   Impression/Plan:    86 y/o female with history of chronic right sided abdominal pain presenting today for appointment. She really does not know why she is here. Her new Education officer, museum (has been covering for past few months) states she had a list of missed appointments they are trying to catch up on. Review of Epic shows numerous ED evaluations at [facility] and [facility] back in June for right sided abdominal pain. Four CT A/P as outlined above. She has had  normal LFTs, CBD prominence in setting of pain medications, prior cholecystectomy, and advanced age likely insignificant. She has had mildly elevated lipase with CT evidence of pancreatic atrophy but no pancreatitis. Suspect abdominal pain musculoskeletal. Does not appear to be related to meals or bowel function, worse with movement.   Mild macrocytic anemia. Follow up PCP labs. May need further evaluation to rule out B12/folate deficiency.   Patient currently declines colonoscopy/endoscopy.

## 2020-01-24 NOTE — Patient Instructions (Signed)
We will be in touch once I review your most recent labs and notes from your family doctor.   Please call if you have any questions or concerns in the meantime.

## 2020-01-25 ENCOUNTER — Other Ambulatory Visit: Payer: Self-pay | Admitting: Cardiovascular Disease

## 2020-01-25 NOTE — Telephone Encounter (Signed)
   *  STAT* If patient is at the pharmacy, call can be transferred to refill team.   1. Which medications need to be refilled? (please list name of each medication and dose if known)   carvedilol (COREG) 6.25 MG tablet    2. Which pharmacy/location (including street and city if local pharmacy) is medication to be sent to? [place], [place]  3. Do they need a 30 day or 90 day supply? 90 days   Pt also need an emergency refill to her pharmacy since she is completely out of medication send 30 days to [place], [place]

## 2020-01-26 MED ORDER — CARVEDILOL 6.25 MG PO TABS: ORAL_TABLET | ORAL | 1 refills | Status: DC

## 2020-01-26 NOTE — Telephone Encounter (Signed)
Refill sent to pharmacy.   

## 2020-01-31 ENCOUNTER — Other Ambulatory Visit: Payer: Self-pay

## 2020-02-03 ENCOUNTER — Other Ambulatory Visit: Payer: Self-pay

## 2020-02-03 ENCOUNTER — Encounter: Payer: Self-pay | Admitting: Cardiovascular Disease

## 2020-02-03 ENCOUNTER — Other Ambulatory Visit: Payer: Self-pay | Admitting: Pharmacist Clinician (PhC)/ Clinical Pharmacy Specialist

## 2020-02-03 ENCOUNTER — Ambulatory Visit (INDEPENDENT_AMBULATORY_CARE_PROVIDER_SITE_OTHER): Payer: Medicare HMO | Admitting: Cardiovascular Disease

## 2020-02-03 VITALS — BP 130/71 | HR 71 | Ht 67.0 in | Wt 145.0 lb

## 2020-02-03 DIAGNOSIS — M7989 Other specified soft tissue disorders: Secondary | ICD-10-CM

## 2020-02-03 DIAGNOSIS — R0602 Shortness of breath: Secondary | ICD-10-CM | POA: Diagnosis not present

## 2020-02-03 DIAGNOSIS — I4821 Permanent atrial fibrillation: Secondary | ICD-10-CM | POA: Diagnosis not present

## 2020-02-03 DIAGNOSIS — I5032 Chronic diastolic (congestive) heart failure: Secondary | ICD-10-CM | POA: Diagnosis not present

## 2020-02-03 DIAGNOSIS — I442 Atrioventricular block, complete: Secondary | ICD-10-CM | POA: Diagnosis not present

## 2020-02-03 DIAGNOSIS — Z950 Presence of cardiac pacemaker: Secondary | ICD-10-CM | POA: Diagnosis not present

## 2020-02-03 LAB — PACEMAKER DEVICE OBSERVATION

## 2020-02-03 MED ORDER — POTASSIUM CHLORIDE ER 10 MEQ PO TBCR: 10.0000 meq | EXTENDED_RELEASE_TABLET | Freq: Every day | ORAL | 2 refills | Status: DC

## 2020-02-03 MED ORDER — APIXABAN 2.5 MG PO TABS: 2.5000 mg | ORAL_TABLET | Freq: Two times a day (BID) | ORAL | 1 refills | Status: DC

## 2020-02-03 MED ORDER — FUROSEMIDE 40 MG PO TABS: 40.0000 mg | ORAL_TABLET | Freq: Every day | ORAL | 0 refills | Status: DC

## 2020-02-03 NOTE — Telephone Encounter (Signed)
On lower dose per MD due to advanced age, frailty and volatile renal function.

## 2020-02-03 NOTE — Patient Instructions (Addendum)
Medication Instructions:  TAKE Furosemide 40 mg once daily TAKE Potassium 10 mEq once daily  *If you need a refill on your cardiac medications before your next appointment, please call your pharmacy*   Lab Work: Your provider would like for you to have the following labs today: CBC, BMET and BNP  If you have labs (blood work) drawn today and your tests are completely normal, you will receive your results only by: [name] MyChart Message (if you have MyChart) OR . A paper copy in the mail If you have any lab test that is abnormal or we need to change your treatment, we will call you to review the results.   Testing/Procedures: A chest x-ray takes a picture of the organs and structures inside the chest, including the heart, lungs, and blood vessels. This test can show several things, including, whether the heart is enlarges; whether fluid is building up in the lungs; and whether pacemaker / defibrillator leads are still in place.  Dr. Sallyanne Kuster would like for you to have an X-ray on your chest and hand. This takes place at [place] [address]    Follow-Up: At [facility], you and your health needs are our priority.  As part of our continuing mission to provide you with exceptional heart care, we have created designated Provider Care Teams.  These Care Teams include your primary Cardiologist (physician) and Advanced Practice Providers (APPs -  Physician Assistants and Nurse Practitioners) who all work together to provide you with the care you need, when you need it.  We recommend signing up for the patient portal called "MyChart".  Sign up information is provided on this After Visit Summary.  MyChart is used to connect with patients for Virtual Visits (Telemedicine).  Patients are able to view lab/test results, encounter notes, upcoming appointments, etc.  Non-urgent messages can be sent to your provider as well.   To learn more about what you can do with MyChart, go to  [email or web address].    Your next appointment:   3 week(s)  The format for your next appointment:   In Person  Provider:   You may see Sanda Klein, MD or one of the following Advanced Practice Providers on your designated Care Team:    Almyra Deforest, PA-C  Fabian Sharp, [place] or   Roby Lofts, [place]  A referral has been made to Dr. Amedeo Plenty concerning your hand.

## 2020-02-03 NOTE — Progress Notes (Signed)
Cardiology Office Note    Date:  02/03/2020   ID:  Kimberly [name], DOB 1927-02-17, MRN [id number]  PCP:  Patient, No Pcp Per  Cardiologist:   Sanda Klein, MD   Chief Complaint  Patient presents with  . Atrial Fibrillation  . Pacemaker Check    History of Present Illness:  Kimberly [name] is a 86 y.o. female with a history of permanent atrial fibrillation with AV node ablation and secondary complete heart block here for follow-up on her CRT-P device. She has mild diastolic heart failure.  She has had shortness of breath with light activity for the last month or 2.  Even getting dressed makes her short of breath.  When she first lies down in bed in the evening she feels a little breathless, but after relaxing she is able to lie flat without dyspnea for the rest of the night on just one pillow.  No evidence of orthopnea or PND.  She has not had chest pain.  She has very rare episodes of mild dizziness but has not experienced syncope.  She denies palpitations.  She has had some ankle swelling off and on, but is only taking furosemide 20 mg once daily which generally takes care of the problem.  Her right hand is very swollen, tender and painful.  It is quite red and warm to touch.  Fingers 2 through 5 are swollen, but the swelling is most pronounced around the metacarpophalangeal joints of the second and third fingers.  She does not remember falling or injuring it, but states that "she must have".  She has never had gout.  She denies fever or chills.  There are no overlying skin lesions.  She seems to be well compensated from a mood point of view.  Her pacemaker showed the marked increase in activity typical of her manic episodes occurring in May-June, currently activity is at baseline.  Pacemaker interrogation shows normal device function.  Her current generator was implanted in 2012 (right ventricular lead from 1997, coronary sinus lead from 2007).  The atrial port is pinned.  Has approximately  97.5 % biventricular pacing (programmed VVIR with LV first), with the remainder being PVCs.  Both right ventricular and left ventricular pacing thresholds are on automatic capture and stable.  Estimated generator longevity is about 3 months (voltage 2.82 V, RRT 2.77 V).  Estimated generator longevity is about 12 months.  Her OptiVol has not exceeded threshold in the last couple of months.  Her most recent echocardiogram performed July 2021 was unchanged from 2016 and showed left ventricular ejection fraction 60-65 %, severe biatrial dilatation, minor valvular abnormalities, peak PA systolic pressure 38 mmHg.  Kimberly [name] has a long-standing history of permanent atrial fibrillation s/p AV node ablation, complete heart block and nonischemic cardiomyopathy with an excellent response to cardiac resynchronization therapy. She originally had a CRT-D device but at the time of generator change out was "downgraded" to a CRT-P. since her left ventricular ejection fraction increased to 50-55%. Her current device is a Risk analyst CRT-P [id number] implanted in June 2012. Her defibrillator lead, which was a Sprint fidelis lead is capped and an older RV pacemaker lead Medtronic [id number] implanted 1997 is being used. The left ventricular lead is a Medtronic [id number] implanted in 2007. She has long-standing problems with chronic scoliosis and fibromyalgia.  In December 2015 she was hospitalized in [place] and received 2 units of blood transfusion. No bleeding events since that time. Hospitalized in January 2016 after a  fall at home,  Possibly precipitated by dehydration complicated by acute on chronic renal failure.   Past Medical History:  Diagnosis Date  . Acute blood loss anemia 10/11/2012  . Acute diverticulitis 08/24/2013  . Acute on chronic combined systolic and diastolic CHF, NYHA class 4 ([place]) 11/15/2013  . Antral ulcer 10/11/2012  . Arrhythmia    atrial fibb  . Atrial fibrillation ([place])   . Bipolar affective disorder  ([place]) 03/23/2017  . Cardiomyopathy, nonischemic ([place])   . Chronic anticoagulation 10/12/2012  . CKD (chronic kidney disease) stage 3, GFR 30-59 ml/min (H[facility]) 10/12/2012  . Depression   . Erosive esophagitis 10/11/2012  . Fibromyalgia   . Glaucoma   . H/O echocardiogram 2007   EF 40-45%,         . Hypertension   . Osteoarthritis   . Pacemaker    Last saw cards 07/2013  . Scoliosis     Past Surgical History:  Procedure Laterality Date  . ABDOMINAL HYSTERECTOMY    . APPENDECTOMY    . BACK SURGERY    . BIOPSY  03/03/2017   Procedure: BIOPSY;  Surgeon: Danie Binder, MD;  Location: [facility];  Service: Endoscopy;;  gastric  . BREAST SURGERY    . CARDIAC CATHETERIZATION  12/08/2005   LAD AND LEFT MAIN WITH NO HIGH-GRADE STENOSIS. MILD DISEASE IN THE CX AND LAD SYSTEM. SEVERE LV DYSFUNCTION WITH DILATION OF THE LV. EF 15-20%. LV END-DIASTOLIC PRESSURE IS 90. +1 MR.  . CHOLECYSTECTOMY    . COLONOSCOPY N/A 03/03/2017   Dr. Oneida Alar: 5 colon polyps removed, adenomatous.  Diverticulosis.  99% of the colon was cleared but the cecum was not adequately seen.  . CYSTOSCOPY N/A 02/24/2013   Procedure: CYSTOSCOPY WITH URETHRAL DILITATION;  Surgeon: Marissa Nestle, MD;  Location: [facility] ORS;  Service: Urology;  Laterality: N/A;  . DOPPLER ECHOCARDIOGRAPHY N/A 05/30/2010   LV SIZE IS NORMAL. LV SYSTOLIC FUNCTION IS LOW NORMAL. EF=50-55%. MILD INFERIOR HYPOKINESIS.MILD TO MODERATE POSTERIOR WALL HYPOKINESIS.PACEMAKER LEAD IN THE RV. LA IS MILDLY DILATED. RA IS MODERATE TO SEVERLY DILATED. PACEMAKER LEAD IN THE RA. MILD CALCICICATION OF THE MV APPARATUS. MODERATE MR. MILD TO MODERATE TR. MILD PHTN.AV MILDLY SCLEROTIC.  [name] ESOPHAGOGASTRODUODENOSCOPY N/A 10/13/2012   Dr. Gala Romney: severe ulcerative reflux esophagitis, question of Barrett's but negative path, single deep prepyloric antral ulcer, negative H.pylori  . ESOPHAGOGASTRODUODENOSCOPY N/A 03/03/2017   Dr. Oneida Alar: Gastritis/duodenitis, no H. pylori  .  HERNIA REPAIR     right inguinal hernia and umbilical  . LOWETR EXT VENOUS Bilateral 11-08-10   R & L- NO EVIDENCE OF THROMBUS OR THROMBOPHLEBITIS. THERE IS MILD AMOUNT OF SUBCUTANEOUS EDEMA NOTED WITHIN THE LEFT CALF AND ANKLE. R & L GSV AND SSV- NO VENOUS INSUFF NOTED.  [name] NECK SURGERY    . NUCLEAR STRESS TEST N/A 02/13/2009   NORMAL PATTERN OF PERFUSION IN ALL REGIONS. POST STRESS VENTICULAR SIZE IS NORMAL. POST  STESS EF 85%.  NORMAL MYOCARDIAL PERFUSION STUDY.  [name] OPEN REDUCTION INTERNAL FIXATION (ORIF) DISTAL PHALANX Left 11/16/2018   Procedure: MIDDLE FINGER OPEN REDUCTION VERSUS RECONSTRUCTION;  Surgeon: Roseanne Kaufman, MD;  Location: [facility];  Service: Orthopedics;  Laterality: Left;  . PACEMAKER INSERTION    . POLYPECTOMY  03/03/2017   Procedure: POLYPECTOMY;  Surgeon: Danie Binder, MD;  Location[facility];  Service: Endoscopy;;  colon  . TONSILLECTOMY    . YAG LASER APPLICATION Bilateral 2/83/1517   Procedure: YAG LASER APPLICATION;  Surgeon: Kayleen Memos  Karel Jarvis, MD;  Location: [facility];  Service: Ophthalmology;  Laterality: Bilateral;    Current Medications: Outpatient Medications Prior to Visit  Medication Sig Dispense Refill  . acetaminophen (TYLENOL) 325 MG tablet Take 2 tablets (650 mg total) by mouth every 6 (six) hours as needed for mild pain, fever or headache (or Fever >/= 101). 12 tablet 0  . calcium carbonate (TUMS - DOSED IN MG ELEMENTAL CALCIUM) 500 MG chewable tablet Chew 1 tablet by mouth daily.    . carvedilol (COREG) 6.25 MG tablet TAKE 1 TABLET BY MOUTH  TWICE DAILY WITH A MEAL 180 tablet 1  . ELIQUIS 2.5 MG TABS tablet TAKE 1 TABLET BY MOUTH  TWICE DAILY 180 tablet 1  . Multiple Vitamins-Minerals (PRESERVISION AREDS 2) CAPS Take 1 capsule by mouth 2 (two) times daily.     . Polyethylene Glycol 400 (BLINK TEARS) 0.25 % SOLN Apply 1 drop to eye daily as needed (for dry eye relief).    . traMADol (ULTRAM) 50 MG tablet Take 1 tablet (50 mg total) by mouth every 6  (six) hours as needed. 8 tablet 0  . furosemide (LASIX) 40 MG tablet Take 0.5-1 tablets (20-40 mg total) by mouth daily. 90 tablet 0  . potassium chloride (KLOR-CON) 10 MEQ tablet Take 1 tablet (10 mEq total) by mouth daily. Take only when you take the 40 mg of Lasix. 30 tablet 2   No facility-administered medications prior to visit.     Allergies:   Ciprofloxacin, Flagyl [metronidazole], Papaya derivatives, Iodine, Penicillins, and Sulfa antibiotics   Social History   Socioeconomic History  . Marital status: Widowed    Spouse name: Not on file  . Number of children: Not on file  . Years of education: Not on file  . Highest education level: Some college, no degree  Occupational History  . Occupation: IT trainer: RETIRED    Comment: retired  Tobacco Use  . Smoking status: Former Smoker    Packs/day: 0.50    Years: 30.00    Pack years: 15.00    Types: Cigarettes    Quit date: 12/13/2004    Years since quitting: 15.1  . Smokeless tobacco: Never Used  . Tobacco comment: former smoker  Vaping Use  . Vaping Use: Never used  Substance and Sexual Activity  . Alcohol use: Yes    Alcohol/week: 0.0 standard drinks    Comment: history of drinking a 1/2 glass of wine in the evening  . Drug use: No  . Sexual activity: Never  Other Topics Concern  . Not on file  Social History Narrative   No home exercise program. PT ordered this week.   Social Determinants of Health   Financial Resource Strain:   . Difficulty of Paying Living Expenses: Not on file  Food Insecurity:   . Worried About Charity fundraiser in the Last Year: Not on file  . Ran Out of Food in the Last Year: Not on file  Transportation Needs: No Transportation Needs  . Lack of Transportation (Medical): No  . Lack of Transportation (Non-Medical): No  Physical Activity: Inactive  . Days of Exercise per Week: 0 days  . Minutes of Exercise per Session: 0 min  Stress:   . Feeling of Stress : Not on  file  Social Connections: Unknown  . Frequency of Communication with Friends and Family: Twice a week  . Frequency of Social Gatherings with Friends and Family: Once a week  .  Attends Religious Services: Never  . Active Member of Clubs or Organizations: No  . Attends Archivist Meetings: Not on file  . Marital Status: Not on file     Family History:  The patient's family history includes Asthma in her sister; Cancer in her brother and sister; Heart failure in her brother; Pulmonary embolism in her brother.   ROS:   Please see the history of present illness.    All other systems are reviewed and are negative  PHYSICAL EXAM:   VS:  BP 130/71   Pulse 71   Ht 5\' 7"  (1.702 m)   Wt 145 lb (65.8 kg)   SpO2 99%   BMI 22.71 kg/m      General: Alert, oriented x3, no distress, healthy subclavian pacemaker site, prominent scoliosis Head: no evidence of trauma, PERRL, EOMI, no exophtalmos or lid lag, no myxedema, no xanthelasma; normal ears, nose and oropharynx Neck: normal jugular venous pulsations and no hepatojugular reflux; brisk carotid pulses without delay and no carotid bruits Chest: clear to auscultation, no signs of consolidation by percussion or palpation, normal fremitus, symmetrical and full respiratory excursions Cardiovascular: normal position and quality of the apical impulse, regular rhythm, normal first and paradoxically split second heart sounds, no murmurs, rubs or gallops Abdomen: no tenderness or distention, no masses by palpation, no abnormal pulsatility or arterial bruits, normal bowel sounds, no hepatosplenomegaly Extremities: There is substantial swelling, redness and warmth of the dorsum of the right hand.  Fingers 2 through 5 are swollen; the worst swelling and redness appears to center around the metacarpal phalangeal joints of the third finger.  There is some ulnar deviation of the same fingers.  She does not have any lower extremity edema; 2+ radial, ulnar  and brachial pulses bilaterally; 2+ right femoral, posterior tibial and dorsalis pedis pulses; 2+ left femoral, posterior tibial and dorsalis pedis pulses; no subclavian or femoral bruits Neurological: grossly nonfocal Psych: Normal mood and affect   Wt Readings from Last 3 Encounters:  02/03/20 145 lb (65.8 kg)  01/24/20 148 lb 12.8 oz (67.5 kg)  12/09/19 145 lb (65.8 kg)      Studies/Labs Reviewed:   ECHO 11/21/2019   1. Left ventricular ejection fraction, by estimation, is 55 to 60%. The  left ventricle has normal function. The left ventricle has no regional  wall motion abnormalities. Left ventricular diastolic function could not  be evaluated.  2. Right ventricular systolic function is normal. The right ventricular  size is normal. There is mildly elevated pulmonary artery systolic  pressure. The estimated right ventricular systolic pressure is 93.8 mmHg.  3. Left atrial size was severely dilated.  4. Right atrial size was severely dilated.  5. The mitral valve is degenerative. Mild mitral valve regurgitation. No  evidence of mitral stenosis.  6. The aortic valve is tricuspid. Aortic valve regurgitation is not  visualized. Mild to moderate aortic valve sclerosis/calcification is  present, without any evidence of aortic stenosis.  7. The inferior vena cava is normal in size with greater than 50%  respiratory variability, suggesting right atrial pressure of 3 mmHg.   Comparison(s): No significant change from prior study. Prior EF 60-65%  (2016).   EKG:  EKG is not ordered today.  ECG November 17, 2019 shows atrial fibrillation with biventricular pacing and positive R wave in lead V1. Recent Labs: 10/02/2019: B Natriuretic Peptide 232.0 10/09/2019: ALT 19; BUN 40; Creatinine, Ser 1.25; Hemoglobin 11.4; Platelets 190; Potassium 4.1; Sodium 139  Lipid Panel    Component Value Date/Time   CHOL 179 03/15/2018 1645   TRIG 65 03/15/2018 1645   HDL 84 03/15/2018 1645    CHOLHDL 2.1 03/15/2018 1645   CHOLHDL 2.3 02/06/2009 0450   VLDL 16 02/06/2009 0450   LDLCALC 82 03/15/2018 1645    ASSESSMENT:    1. Chronic diastolic heart failure ([place])   2. Permanent atrial fibrillation   3. Complete heart block ([place])   4. Biventricular cardiac pacemaker in situ   5. Swelling of right hand   6. Shortness of breath      PLAN:  In order of problems listed above:  1. CHF: She is a CRT hyperresponder who has recovered left ventricular systolic function.  She does not have any overt findings to suggest hypervolemia on physical exam and her thoracic impedance is stable.  I do not think her dyspnea is cardiac, but will check a chest x-ray and a BNP.  We will slightly increase her dose of diuretic and the potassium supplement to see if this provides symptom relief and reevaluate in a few weeks. 2. AFib s/p AV node ablation: On lower dose creatinine due to her advanced age, small body size and volatile renal function.  She has never had a stroke or other embolic events.  No recent documented falls or bleeding problems. 3. CHB: Pacemaker dependent after AV node ablation 4. CRT-P: Normal device function, anticipate need for generator change in about 3 months.  She is pacemaker dependent. 5. Right hand inflammation: I am not sure if she has localized infection, posttraumatic swelling (she does not remember injuring the hand) or some other inflammatory disorder.  We will also check x-rays of the hands.  Referred to Dr. Amedeo Plenty at [facility].    Medication Adjustments/Labs and Tests Ordered: Current medicines are reviewed at length with the patient today.  Concerns regarding medicines are outlined above.  Medication changes, Labs and Tests ordered today are listed in the Patient Instructions below. Patient Instructions  Medication Instructions:  TAKE Furosemide 40 mg once daily TAKE Potassium 10 mEq once daily  *If you need a refill on your cardiac medications before your  next appointment, please call your pharmacy*   Lab Work: Your provider would like for you to have the following labs today: CBC, BMET and BNP  If you have labs (blood work) drawn today and your tests are completely normal, you will receive your results only by: [name] MyChart Message (if you have MyChart) OR . A paper copy in the mail If you have any lab test that is abnormal or we need to change your treatment, we will call you to review the results.   Testing/Procedures: A chest x-ray takes a picture of the organs and structures inside the chest, including the heart, lungs, and blood vessels. This test can show several things, including, whether the heart is enlarges; whether fluid is building up in the lungs; and whether pacemaker / defibrillator leads are still in place.  Dr. Sallyanne Kuster would like for you to have an X-ray on your chest and hand. This takes place at [place] [address]    Follow-Up: At [facility], you and your health needs are our priority.  As part of our continuing mission to provide you with exceptional heart care, we have created designated Provider Care Teams.  These Care Teams include your primary Cardiologist (physician) and Advanced Practice Providers (APPs -  Physician Assistants and Nurse Practitioners) who all work together  to provide you with the care you need, when you need it.  We recommend signing up for the patient portal called "MyChart".  Sign up information is provided on this After Visit Summary.  MyChart is used to connect with patients for Virtual Visits (Telemedicine).  Patients are able to view lab/test results, encounter notes, upcoming appointments, etc.  Non-urgent messages can be sent to your provider as well.   To learn more about what you can do with MyChart, go to [email or web address].    Your next appointment:   3 week(s)  The format for your next appointment:   In Person  Provider:   You may see Sanda Klein, MD  or one of the following Advanced Practice Providers on your designated Care Team:    Almyra Deforest, PA-C  Fabian Sharp, [place] or   Roby Lofts, [place]  A referral has been made to Dr. Amedeo Plenty concerning your hand.    Signed, Sanda Klein, MD  02/03/2020 1:24 PM    [facility] [address], [place], [place]  [address] Phone: [phone]; Fax: [phone]

## 2020-02-04 LAB — CBC
Hematocrit: 31.5 % — ABNORMAL LOW (ref 34.0–46.6)
Hemoglobin: 9.8 g/dL — ABNORMAL LOW (ref 11.1–15.9)
MCH: 27 pg (ref 26.6–33.0)
MCHC: 31.1 g/dL — ABNORMAL LOW (ref 31.5–35.7)
MCV: 87 fL (ref 79–97)
Platelets: 285 10*3/uL (ref 150–450)
RBC: 3.63 x10E6/uL — ABNORMAL LOW (ref 3.77–5.28)
RDW: 14.5 % (ref 11.7–15.4)
WBC: 9.7 10*3/uL (ref 3.4–10.8)

## 2020-02-04 LAB — BASIC METABOLIC PANEL
BUN/Creatinine Ratio: 16 (ref 12–28)
BUN: 27 mg/dL (ref 10–36)
CO2: 21 mmol/L (ref 20–29)
Calcium: 8.9 mg/dL (ref 8.7–10.3)
Chloride: 97 mmol/L (ref 96–106)
Creatinine, Ser: 1.64 mg/dL — ABNORMAL HIGH (ref 0.57–1.00)
GFR calc Af Amer: 31 mL/min/{1.73_m2} — ABNORMAL LOW (ref >59)
GFR calc non Af Amer: 27 mL/min/{1.73_m2} — ABNORMAL LOW (ref >59)
Glucose: 87 mg/dL (ref 65–99)
Potassium: 3.5 mmol/L (ref 3.5–5.2)
Sodium: 137 mmol/L (ref 134–144)

## 2020-02-04 LAB — BRAIN NATRIURETIC PEPTIDE: BNP: 293.7 pg/mL — ABNORMAL HIGH (ref 0.0–100.0)

## 2020-02-06 ENCOUNTER — Other Ambulatory Visit: Payer: Self-pay | Admitting: *Deleted

## 2020-02-06 ENCOUNTER — Telehealth: Payer: Self-pay | Admitting: *Deleted

## 2020-02-06 MED ORDER — FUROSEMIDE 20 MG PO TABS: 20.0000 mg | ORAL_TABLET | Freq: Every day | ORAL | 0 refills | Status: DC

## 2020-02-06 MED ORDER — POTASSIUM CHLORIDE ER 10 MEQ PO TBCR: 10.0000 meq | EXTENDED_RELEASE_TABLET | Freq: Every day | ORAL | 2 refills | Status: DC

## 2020-02-06 NOTE — Telephone Encounter (Signed)
-----   Message from Sanda Klein, MD sent at 02/04/2020  2:35 PM EDT ----- BNP is lower than in the past and creatinine is higher than baseline, suggesting that CHF is not the reason for the worsening dyspnea. On the other hand she has developed mild normocytic anemia.  This is similar to hospitalization, last July. Interestingly, she also had the swollen left hand (same 3rd finger metacarpophalangeal joint) and surgery by Dr. Amedeo Plenty at that time, suspicion for crystalline arthropathy. However, the lab never made a comment on the specimen submitted for crystals. She may need treatment with colchicine. I doubt the diuretic will help and it may hurt. Can decrease back to 20 mg once daily and stop the KCl supplement. She still should have her chest and hand xrays.

## 2020-02-06 NOTE — Telephone Encounter (Signed)
Patient made aware of results and verbalized understanding.  She has been made aware to get the X-Rays done today if possible. Potassium has been discontinued and Furosemide decreased to 20 mg once daily.  Message sent to the pharmacy.

## 2020-02-07 ENCOUNTER — Other Ambulatory Visit: Payer: Self-pay | Admitting: *Deleted

## 2020-02-07 ENCOUNTER — Ambulatory Visit
Admission: RE | Admit: 2020-02-07 | Discharge: 2020-02-07 | Disposition: A | Discharge status: Home / Self Care | Payer: Medicare HMO | Source: Ambulatory Visit | Attending: Cardiovascular Disease | Admitting: Cardiovascular Disease

## 2020-02-07 DIAGNOSIS — M7989 Other specified soft tissue disorders: Secondary | ICD-10-CM

## 2020-02-07 DIAGNOSIS — R0602 Shortness of breath: Secondary | ICD-10-CM

## 2020-02-07 DIAGNOSIS — M79641 Pain in right hand: Secondary | ICD-10-CM | POA: Diagnosis not present

## 2020-02-07 DIAGNOSIS — M810 Age-related osteoporosis without current pathological fracture: Secondary | ICD-10-CM | POA: Diagnosis not present

## 2020-02-07 DIAGNOSIS — M069 Rheumatoid arthritis, unspecified: Secondary | ICD-10-CM | POA: Diagnosis not present

## 2020-02-10 ENCOUNTER — Telehealth: Payer: Self-pay | Admitting: Gastroenterology

## 2020-02-10 NOTE — Telephone Encounter (Signed)
Lmom, waiting on a return call.  

## 2020-02-10 NOTE — Telephone Encounter (Signed)
Labs from August 2021: Hepatitis A IgM negative, hepatitis B surface antigen negative, hepatitis B core IgM negative, hepatitis C antibody negative, BUN 14, creatinine 1.11, albumin 3.9, total bilirubin 0.2, alkaline phosphatase 67, AST 14, ALT seven.  White blood cell count 6400, hemoglobin 10, MCV 97, platelets 267,000, hematocrit 30.8 TSH 2.250.  B12 332.  Please let pt know I have reviewed labs. She has mild anemia which is stable. Her B12 is normal.  Recommend repeat CBC and folate level in four weeks for macrocytic anemia.

## 2020-02-13 ENCOUNTER — Other Ambulatory Visit: Payer: Self-pay | Admitting: *Deleted

## 2020-02-13 DIAGNOSIS — K279 Peptic ulcer, site unspecified, unspecified as acute or chronic, without hemorrhage or perforation: Secondary | ICD-10-CM

## 2020-02-13 DIAGNOSIS — R109 Unspecified abdominal pain: Secondary | ICD-10-CM

## 2020-02-13 DIAGNOSIS — D649 Anemia, unspecified: Secondary | ICD-10-CM

## 2020-02-13 DIAGNOSIS — R1030 Lower abdominal pain, unspecified: Secondary | ICD-10-CM

## 2020-02-13 DIAGNOSIS — R269 Unspecified abnormalities of gait and mobility: Secondary | ICD-10-CM

## 2020-02-13 DIAGNOSIS — M549 Dorsalgia, unspecified: Secondary | ICD-10-CM

## 2020-02-13 NOTE — Telephone Encounter (Signed)
Yes I need a folate. Thanks!

## 2020-02-13 NOTE — Telephone Encounter (Signed)
Placed order in Epic but faxed to [facility] so they can draw on 03/01/2020.  Requested results to be sent to [place] when available.

## 2020-02-13 NOTE — Telephone Encounter (Addendum)
Called pt to let her know that Neil Crouch, PA  Reviewed her labs. Informed her that she has mild anemia which is stable. Her B12 is normal. Informed pt that Magda Paganini recommends repeat CBC and folate level in four weeks for macrocytic anemia.  Pt informed me that she is having labs drawn at [facility] on 03/01/2020.  Neil Crouch, PA:  Called [facility] to confirm which labs that they are drawing on 03/01/20.  They are CMET, CBC, and Vit D.  Would you like me to send over request for Folate Level as well?

## 2020-02-27 ENCOUNTER — Ambulatory Visit (INDEPENDENT_AMBULATORY_CARE_PROVIDER_SITE_OTHER): Payer: Medicare HMO | Admitting: Cardiovascular Disease

## 2020-02-27 ENCOUNTER — Encounter: Payer: Self-pay | Admitting: Cardiovascular Disease

## 2020-02-27 VITALS — BP 119/68 | HR 74 | Ht 64.0 in | Wt 143.4 lb

## 2020-02-27 DIAGNOSIS — I4821 Permanent atrial fibrillation: Secondary | ICD-10-CM

## 2020-02-27 DIAGNOSIS — I442 Atrioventricular block, complete: Secondary | ICD-10-CM | POA: Diagnosis not present

## 2020-02-27 DIAGNOSIS — Z950 Presence of cardiac pacemaker: Secondary | ICD-10-CM | POA: Diagnosis not present

## 2020-02-27 DIAGNOSIS — I5032 Chronic diastolic (congestive) heart failure: Secondary | ICD-10-CM | POA: Diagnosis not present

## 2020-02-27 DIAGNOSIS — M069 Rheumatoid arthritis, unspecified: Secondary | ICD-10-CM

## 2020-02-27 NOTE — Patient Instructions (Signed)

## 2020-02-27 NOTE — Progress Notes (Signed)
Cardiology Office Note    Date:  02/27/2020   ID:  Kimberly [name], DOB [birth year], MRN [id number]  PCP:  Patient, No Pcp Per  Cardiologist:   Sanda Klein, MD   No chief complaint on file.   History of Present Illness:  Kimberly [name] is a 86 y.o. female with a history of permanent atrial fibrillation with AV node ablation and secondary complete heart block here for follow-up on her CRT-P device. She has mild diastolic heart failure.  She presented with complaints of shortness of breath a few weeks ago.  Unclear whether the symptoms were due to diastolic heart failure since she did not have any clinical findings to suggest hypervolemia and her device OptiVol did not suggest volume overload.  We checked a chest x-ray and a BNP, neither of which were not consistent with heart failure exacerbation.  I am not sure why, but her breathing seems to have improved.  She does not complain of it today.  She has not had chest pain, palpitations, dizziness or syncope.  She does not have lower extremity edema.    She was also having a lot of problems with swelling and tenderness in her metacarpophalangeal joints, particularly the third left joint which has previously undergone surgery.  The x-ray again showed evidence consistent with rheumatoid arthritis and joint dislocation.  I recommended referral to a rheumatologist and follow-up with Dr. Amedeo Plenty in the orthopedic clinic but she has declined both recommendations.  The swelling in the left metacarpophalangeal joints is less and there is substantially less redness.  However, there is still very prominent changes of rheumatoid arthritis.  Just like at her last appointment her 2012 biventricular pacemaker (Medtronic consulta CRT-P), shows 100% biventricular pacing.  Her OptiVol is in normal range.  There have been no episodes of high ventricular rates.  Estimated generator longevity is about 4 months.  Voltage was 2.82 V, RRT at 2.77 V.  Her current  generator was implanted in 2012 (right ventricular lead from 1997, coronary sinus lead from 2007).  The atrial port is pinned.  programmed VVIR with LV first.  Both right ventricular and left ventricular pacing thresholds are on automatic capture and stable.    Her most recent echocardiogram performed July 2021 was unchanged from 2016 and showed left ventricular ejection fraction 60-65 %, severe biatrial dilatation, minor valvular abnormalities, peak PA systolic pressure 38 mmHg.  Kimberly [name] has a long-standing history of permanent atrial fibrillation s/p AV node ablation, complete heart block and nonischemic cardiomyopathy with an excellent response to cardiac resynchronization therapy. She originally had a CRT-D device but at the time of generator change out was "downgraded" to a CRT-P. since her left ventricular ejection fraction increased to 50-55%. Her current device is a Risk analyst CRT-P [id number] implanted in June 2012. Her defibrillator lead, which was a Sprint fidelis lead is capped and an older RV pacemaker lead Medtronic [id number] implanted 1997 is being used. The left ventricular lead is a Medtronic [id number] implanted in 2007. She has long-standing problems with chronic scoliosis and fibromyalgia.  In December 2015 she was hospitalized in [place] and received 2 units of blood transfusion. No bleeding events since that time. Hospitalized in January 2016 after a fall at home,  Possibly precipitated by dehydration complicated by acute on chronic renal failure.   Past Medical History:  Diagnosis Date  . Acute blood loss anemia 10/11/2012  . Acute diverticulitis 08/24/2013  . Acute on chronic combined systolic and diastolic CHF, NYHA  class 4 ([place]) 11/15/2013  . Antral ulcer 10/11/2012  . Arrhythmia    atrial fibb  . Atrial fibrillation ([place])   . Bipolar affective disorder ([place]) 03/23/2017  . Cardiomyopathy, nonischemic ([place])   . Chronic anticoagulation 10/12/2012  . CKD (chronic kidney disease) stage  3, GFR 30-59 ml/min (H[facility]) 10/12/2012  . Depression   . Erosive esophagitis 10/11/2012  . Fibromyalgia   . Glaucoma   . H/O echocardiogram 2007   EF 40-45%,         . Hypertension   . Osteoarthritis   . Pacemaker    Last saw cards 07/2013  . Scoliosis     Past Surgical History:  Procedure Laterality Date  . ABDOMINAL HYSTERECTOMY    . APPENDECTOMY    . BACK SURGERY    . BIOPSY  03/03/2017   Procedure: BIOPSY;  Surgeon: Danie Binder, MD;  Location: [facility];  Service: Endoscopy;;  gastric  . BREAST SURGERY    . CARDIAC CATHETERIZATION  12/08/2005   LAD AND LEFT MAIN WITH NO HIGH-GRADE STENOSIS. MILD DISEASE IN THE CX AND LAD SYSTEM. SEVERE LV DYSFUNCTION WITH DILATION OF THE LV. EF 15-20%. LV END-DIASTOLIC PRESSURE IS 90. +1 MR.  . CHOLECYSTECTOMY    . COLONOSCOPY N/A 03/03/2017   Dr. Oneida Alar: 5 colon polyps removed, adenomatous.  Diverticulosis.  99% of the colon was cleared but the cecum was not adequately seen.  . CYSTOSCOPY N/A 02/24/2013   Procedure: CYSTOSCOPY WITH URETHRAL DILITATION;  Surgeon: Marissa Nestle, MD;  Location: [facility];  Service: Urology;  Laterality: N/A;  . DOPPLER ECHOCARDIOGRAPHY N/A 05/30/2010   LV SIZE IS NORMAL. LV SYSTOLIC FUNCTION IS LOW NORMAL. EF=50-55%. MILD INFERIOR HYPOKINESIS.MILD TO MODERATE POSTERIOR WALL HYPOKINESIS.PACEMAKER LEAD IN THE RV. LA IS MILDLY DILATED. RA IS MODERATE TO SEVERLY DILATED. PACEMAKER LEAD IN THE RA. MILD CALCICICATION OF THE MV APPARATUS. MODERATE MR. MILD TO MODERATE TR. MILD PHTN.AV MILDLY SCLEROTIC.  [name] ESOPHAGOGASTRODUODENOSCOPY N/A 10/13/2012   Dr. Gala Romney: severe ulcerative reflux esophagitis, question of Barrett's but negative path, single deep prepyloric antral ulcer, negative H.pylori  . ESOPHAGOGASTRODUODENOSCOPY N/A 03/03/2017   Dr. Oneida Alar: Gastritis/duodenitis, no H. pylori  . HERNIA REPAIR     right inguinal hernia and umbilical  . LOWETR EXT VENOUS Bilateral 11-08-10   R & L- NO EVIDENCE OF THROMBUS OR  THROMBOPHLEBITIS. THERE IS MILD AMOUNT OF SUBCUTANEOUS EDEMA NOTED WITHIN THE LEFT CALF AND ANKLE. R & L GSV AND SSV- NO VENOUS INSUFF NOTED.  [name] NECK SURGERY    . NUCLEAR STRESS TEST N/A 02/13/2009   NORMAL PATTERN OF PERFUSION IN ALL REGIONS. POST STRESS VENTICULAR SIZE IS NORMAL. POST  STESS EF 85%.  NORMAL MYOCARDIAL PERFUSION STUDY.  [name] OPEN REDUCTION INTERNAL FIXATION (ORIF) DISTAL PHALANX Left 11/16/2018   Procedure: MIDDLE FINGER OPEN REDUCTION VERSUS RECONSTRUCTION;  Surgeon: Roseanne Kaufman, MD;  Location: [facility];  Service: Orthopedics;  Laterality: Left;  . PACEMAKER INSERTION    . POLYPECTOMY  03/03/2017   Procedure: POLYPECTOMY;  Surgeon: Danie Binder, MD;  Location[facility];  Service: Endoscopy;;  colon  . TONSILLECTOMY    . YAG LASER APPLICATION Bilateral 6/38/9373   Procedure: YAG LASER APPLICATION;  Surgeon: Williams Che, MD;  Location: [facility];  Service: Ophthalmology;  Laterality: Bilateral;    Current Medications: Outpatient Medications Prior to Visit  Medication Sig Dispense Refill  . acetaminophen (TYLENOL) 325 MG tablet Take 2 tablets (650 mg total) by mouth every 6 (six)  hours as needed for mild pain, fever or headache (or Fever >/= 101). 12 tablet 0  . apixaban (ELIQUIS) 2.5 MG TABS tablet Take 1 tablet (2.5 mg total) by mouth 2 (two) times daily. 180 tablet 1  . calcium carbonate (TUMS - DOSED IN MG ELEMENTAL CALCIUM) 500 MG chewable tablet Chew 1 tablet by mouth daily.    . carvedilol (COREG) 6.25 MG tablet TAKE 1 TABLET BY MOUTH  TWICE DAILY WITH A MEAL 180 tablet 1  . furosemide (LASIX) 20 MG tablet Take 1 tablet (20 mg total) by mouth daily. 30 tablet 0  . Multiple Vitamins-Minerals (PRESERVISION AREDS 2) CAPS Take 1 capsule by mouth 2 (two) times daily.     . Polyethylene Glycol 400 (BLINK TEARS) 0.25 % SOLN Apply 1 drop to eye daily as needed (for dry eye relief).    . traMADol (ULTRAM) 50 MG tablet Take 1 tablet (50 mg total) by mouth every 6 (six)  hours as needed. 8 tablet 0   No facility-administered medications prior to visit.     Allergies:   Ciprofloxacin, Flagyl [metronidazole], Papaya derivatives, Iodine, Penicillins, and Sulfa antibiotics   Social History   Socioeconomic History  . Marital status: Widowed    Spouse name: Not on file  . Number of children: Not on file  . Years of education: Not on file  . Highest education level: Some college, no degree  Occupational History  . Occupation: IT trainer: RETIRED    Comment: retired  Tobacco Use  . Smoking status: Former Smoker    Packs/day: 0.50    Years: 30.00    Pack years: 15.00    Types: Cigarettes    Quit date: 12/13/2004    Years since quitting: 15.2  . Smokeless tobacco: Never Used  . Tobacco comment: former smoker  Vaping Use  . Vaping Use: Never used  Substance and Sexual Activity  . Alcohol use: Yes    Alcohol/week: 0.0 standard drinks    Comment: history of drinking a 1/2 glass of wine in the evening  . Drug use: No  . Sexual activity: Never  Other Topics Concern  . Not on file  Social History Narrative   No home exercise program. PT ordered this week.   Social Determinants of Health   Financial Resource Strain:   . Difficulty of Paying Living Expenses: Not on file  Food Insecurity:   . Worried About Charity fundraiser in the Last Year: Not on file  . Ran Out of Food in the Last Year: Not on file  Transportation Needs: No Transportation Needs  . Lack of Transportation (Medical): No  . Lack of Transportation (Non-Medical): No  Physical Activity: Inactive  . Days of Exercise per Week: 0 days  . Minutes of Exercise per Session: 0 min  Stress:   . Feeling of Stress : Not on file  Social Connections: Unknown  . Frequency of Communication with Friends and Family: Twice a week  . Frequency of Social Gatherings with Friends and Family: Once a week  . Attends Religious Services: Never  . Active Member of Clubs or  Organizations: No  . Attends Archivist Meetings: Not on file  . Marital Status: Not on file     Family History:  The patient's family history includes Asthma in her sister; Cancer in her brother and sister; Heart failure in her brother; Pulmonary embolism in her brother.   ROS:   Please see the history  of present illness.    All other systems are reviewed and are negative  PHYSICAL EXAM:   VS:  BP 119/68   Pulse 74   Ht 5\' 4"  (1.626 m)   Wt 143 lb 6.4 oz (65 kg)   SpO2 99%   BMI 24.61 kg/m      General: Alert, oriented x3, no distress, healthy subclavian pacemaker site, prominent scoliosis Head: no evidence of trauma, PERRL, EOMI, no exophtalmos or lid lag, no myxedema, no xanthelasma; normal ears, nose and oropharynx Neck: normal jugular venous pulsations and no hepatojugular reflux; brisk carotid pulses without delay and no carotid bruits Chest: clear to auscultation, no signs of consolidation by percussion or palpation, normal fremitus, symmetrical and full respiratory excursions Cardiovascular: normal position and quality of the apical impulse, regular rhythm, normal first and paradoxically split second heart sounds, no murmurs, rubs or gallops Abdomen: no tenderness or distention, no masses by palpation, no abnormal pulsatility or arterial bruits, normal bowel sounds, no hepatosplenomegaly Extremities: There is substantial swelling, redness and warmth of the dorsum of the right hand.  Fingers 2 through 5 are swollen; the worst swelling and redness appears to center around the metacarpal phalangeal joints of the third finger.  There is some ulnar deviation of the same fingers.  She does not have any lower extremity edema; 2+ radial, ulnar and brachial pulses bilaterally; 2+ right femoral, posterior tibial and dorsalis pedis pulses; 2+ left femoral, posterior tibial and dorsalis pedis pulses; no subclavian or femoral bruits Neurological: grossly nonfocal Psych: Normal mood  and affect   Wt Readings from Last 3 Encounters:  02/27/20 143 lb 6.4 oz (65 kg)  02/03/20 145 lb (65.8 kg)  01/24/20 148 lb 12.8 oz (67.5 kg)      Studies/Labs Reviewed:   ECHO 11/21/2019   1. Left ventricular ejection fraction, by estimation, is 55 to 60%. The  left ventricle has normal function. The left ventricle has no regional  wall motion abnormalities. Left ventricular diastolic function could not  be evaluated.  2. Right ventricular systolic function is normal. The right ventricular  size is normal. There is mildly elevated pulmonary artery systolic  pressure. The estimated right ventricular systolic pressure is 83.3 mmHg.  3. Left atrial size was severely dilated.  4. Right atrial size was severely dilated.  5. The mitral valve is degenerative. Mild mitral valve regurgitation. No  evidence of mitral stenosis.  6. The aortic valve is tricuspid. Aortic valve regurgitation is not  visualized. Mild to moderate aortic valve sclerosis/calcification is  present, without any evidence of aortic stenosis.  7. The inferior vena cava is normal in size with greater than 50%  respiratory variability, suggesting right atrial pressure of 3 mmHg.   Comparison(s): No significant change from prior study. Prior EF 60-65%  (2016).   EKG:  EKG is not ordered today.  ECG November 17, 2019 shows atrial fibrillation with biventricular pacing and positive R wave in lead V1. Recent Labs: 10/09/2019: ALT 19 02/03/2020: BNP 293.7; BUN 27; Creatinine, Ser 1.64; Hemoglobin 9.8; Platelets 285; Potassium 3.5; Sodium 137   Lipid Panel    Component Value Date/Time   CHOL 179 03/15/2018 1645   TRIG 65 03/15/2018 1645   HDL 84 03/15/2018 1645   CHOLHDL 2.1 03/15/2018 1645   CHOLHDL 2.3 02/06/2009 0450   VLDL 16 02/06/2009 0450   LDLCALC 82 03/15/2018 1645    ASSESSMENT:    1. Chronic diastolic heart failure ([place])   2. Permanent atrial fibrillation  3. CHB (complete heart block) ([place])    4. Biventricular cardiac pacemaker in situ   5. Rheumatoid arthritis of metacarpophalangeal joint (H[facility])      PLAN:  In order of problems listed above:  1. CHF: Appears to be euvolemic clinically, based on thoracic impedance/OptiVol and by her labs.  She does not require additional diuretics.  She is a CRT hyperresponder who has recovered left ventricular systolic function.  2. AFib s/p AV node ablation: On lower dose creatinine due to her advanced age, small body size and volatile renal function.  She has never had a stroke or other embolic events.  No recent documented falls or bleeding problems. 3. CHB: Pacemaker dependent after AV node ablation 4. CRT-P: She is pacemaker dependent and we anticipate need for generator change in 3-4 months.  Doing monthly downloads from her device. 5. Right hand inflammation: I guess retrospectively that she just had an episode of rheumatoid arthritis exacerbation. She declines my recommendation to see a rheumatologist and an orthopedic surgeon.  She is not on any disease modifying antirheumatic drugs.    Medication Adjustments/Labs and Tests Ordered: Current medicines are reviewed at length with the patient today.  Concerns regarding medicines are outlined above.  Medication changes, Labs and Tests ordered today are listed in the Patient Instructions below. Patient Instructions  Medication Instructions:  No changes *If you need a refill on your cardiac medications before your next appointment, please call your pharmacy*   Lab Work: None ordered If you have labs (blood work) drawn today and your tests are completely normal, you will receive your results only by: [name] MyChart Message (if you have MyChart) OR . A paper copy in the mail If you have any lab test that is abnormal or we need to change your treatment, we will call you to review the results.   Testing/Procedures: None ordered   Follow-Up: At [facility], you and your health needs are  our priority.  As part of our continuing mission to provide you with exceptional heart care, we have created designated Provider Care Teams.  These Care Teams include your primary Cardiologist (physician) and Advanced Practice Providers (APPs -  Physician Assistants and Nurse Practitioners) who all work together to provide you with the care you need, when you need it.  We recommend signing up for the patient portal called "MyChart".  Sign up information is provided on this After Visit Summary.  MyChart is used to connect with patients for Virtual Visits (Telemedicine).  Patients are able to view lab/test results, encounter notes, upcoming appointments, etc.  Non-urgent messages can be sent to your provider as well.   To learn more about what you can do with MyChart, go to [email or web address].    Your next appointment:   12 month(s)  The format for your next appointment:   In Person  Provider:   Sanda Klein, MD     Signed, Sanda Klein, MD  02/27/2020 7:27 PM    [address], [place], [place]  [address] Phone: [phone]; Fax: [phone]

## 2020-03-06 DIAGNOSIS — Z5948 Other specified lack of adequate food: Secondary | ICD-10-CM | POA: Diagnosis not present

## 2020-03-06 DIAGNOSIS — Z950 Presence of cardiac pacemaker: Secondary | ICD-10-CM | POA: Diagnosis not present

## 2020-03-06 DIAGNOSIS — E559 Vitamin D deficiency, unspecified: Secondary | ICD-10-CM | POA: Diagnosis not present

## 2020-03-06 DIAGNOSIS — E611 Iron deficiency: Secondary | ICD-10-CM | POA: Diagnosis not present

## 2020-03-06 DIAGNOSIS — Z604 Social exclusion and rejection: Secondary | ICD-10-CM | POA: Diagnosis not present

## 2020-03-06 DIAGNOSIS — I5032 Chronic diastolic (congestive) heart failure: Secondary | ICD-10-CM | POA: Diagnosis not present

## 2020-03-06 DIAGNOSIS — I442 Atrioventricular block, complete: Secondary | ICD-10-CM | POA: Diagnosis not present

## 2020-03-08 ENCOUNTER — Telehealth: Payer: Self-pay | Admitting: Cardiovascular Disease

## 2020-03-08 NOTE — Telephone Encounter (Signed)
    Webb Silversmith from [facility] rheumatology calling, she just wanted to let Dr. Loletha Grayer know that pt missed her appt today

## 2020-03-08 NOTE — Telephone Encounter (Signed)
Noted thank you

## 2020-03-23 ENCOUNTER — Telehealth: Payer: Self-pay | Admitting: Gastroenterology

## 2020-03-23 NOTE — Telephone Encounter (Signed)
Received labs dated 03/06/20: Creatinine 1.38, bun 20, LFTs normal, TSH normal, H/H 11.2/35.9 normal, ferritin 35, b12 278, folate 4.4, iron 56, tibc 387  Further review of records, appears part of gi referral was for elevated lipase in ed 10/2019, mild nonspecific elevated with CT abd with contrast 10/2019 at [facility] with pancreatic atrophy but no masses or ductal dilation.   Please request last two OVs from PCP.

## 2020-03-26 NOTE — Telephone Encounter (Signed)
Requested from [place]

## 2020-03-28 NOTE — Telephone Encounter (Signed)
Reviewed last two ov notes from PCP. No additional recommendations.   Please have patient take folic acid 954 units daily for 3 months.  Return office with [place] as needed.

## 2020-04-02 NOTE — Telephone Encounter (Signed)
Phoned pt and LM on her VM to return call

## 2020-04-05 NOTE — Telephone Encounter (Signed)
Phoned and LM on vm of the patient to return call.

## 2020-04-06 NOTE — Progress Notes (Signed)
Dear Ms. Gambrill We have tried to contact you several times regarding your labs. Can you/your representative call us at [phone] at your earliest convenience.    Thank you, Floria Raveling, CMA

## 2020-04-06 NOTE — Telephone Encounter (Signed)
Letter sent.

## 2020-05-07 ENCOUNTER — Telehealth: Payer: Self-pay | Admitting: Emergency Medicine

## 2020-05-07 ENCOUNTER — Telehealth: Payer: Self-pay | Admitting: Cardiovascular Disease

## 2020-05-07 LAB — CUP PACEART REMOTE DEVICE CHECK
Battery Remaining Longevity: 3 mo
Battery Voltage: 2.79 V
Brady Statistic AP VP Percent: 0 %
Brady Statistic AP VS Percent: 0 %
Brady Statistic AS VP Percent: 99.44 %
Brady Statistic AS VS Percent: 0.56 %
Brady Statistic RA Percent Paced: 0 %
Brady Statistic RV Percent Paced: 99.16 %
Date Time Interrogation Session: 20220103192446
Implantable Lead Implant Date: 19971124
Implantable Lead Implant Date: 20070831
Implantable Lead Location: 753858
Implantable Lead Location: 753860
Implantable Lead Model: 4024
Implantable Lead Model: 4194
Implantable Pulse Generator Implant Date: 20120619
Lead Channel Impedance Value: 4047 Ohm
Lead Channel Impedance Value: 4047 Ohm
Lead Channel Impedance Value: 494 Ohm
Lead Channel Impedance Value: 494 Ohm
Lead Channel Impedance Value: 513 Ohm
Lead Channel Impedance Value: 513 Ohm
Lead Channel Impedance Value: 684 Ohm
Lead Channel Impedance Value: 703 Ohm
Lead Channel Impedance Value: 912 Ohm
Lead Channel Pacing Threshold Amplitude: 0.625 V
Lead Channel Pacing Threshold Amplitude: 1.25 V
Lead Channel Pacing Threshold Pulse Width: 0.4 ms
Lead Channel Pacing Threshold Pulse Width: 1 ms
Lead Channel Sensing Intrinsic Amplitude: 8.875 mV
Lead Channel Sensing Intrinsic Amplitude: 8.875 mV
Lead Channel Setting Pacing Amplitude: 2 V
Lead Channel Setting Pacing Amplitude: 2.25 V
Lead Channel Setting Pacing Pulse Width: 0.4 ms
Lead Channel Setting Pacing Pulse Width: 1 ms
Lead Channel Setting Sensing Sensitivity: 2.8 mV

## 2020-05-07 NOTE — Telephone Encounter (Addendum)
The patient has been made aware to increase her Furosemide to 40 mg once daily through her appointment time with Denyse Amass.

## 2020-05-07 NOTE — Telephone Encounter (Signed)
Thanks. It would be great if she has her pacemaker download performed so I can look at it before she sees [place]. It is due anyway.

## 2020-05-07 NOTE — Telephone Encounter (Signed)
Pt c/o Shortness Of Breath: STAT if SOB developed within the last 24 hours or pt is noticeably SOB on the phone  1. Are you currently SOB (can you hear that pt is SOB on the phone)? Yes.  2. How long have you been experiencing SOB? For a couple days.  3. Are you SOB when sitting or when up moving around? Sitting and with exertion.  4. Are you currently experiencing any other symptoms? Lower abdominal pain.  Patein is calling is with concerns about the SOB she's been experiencing for the past few days. Please advise.

## 2020-05-07 NOTE — Telephone Encounter (Signed)
Optivol is out of range, suggests fluid overload. Only one 7-beat NSVT on Dec 13. Has >99% BiV pacing. Battery to reach replacement indicator in next 1-3 months.  Please increase furosemide to 40 mg daily and keep appt w Denyse Amass. Thanks.

## 2020-05-07 NOTE — Telephone Encounter (Signed)
Opened in Error.CLR

## 2020-05-07 NOTE — Telephone Encounter (Signed)
Contacted patient by phone in reference to her device reaching ERI. Device is showing remaining battery life three months. Advised patient that the device clinic would be conducting monthly battery checks. Patient agreed to monthly battery checks.

## 2020-05-07 NOTE — Telephone Encounter (Signed)
Direct call into triage. Pt feels her breathing has been labored for 2 days. She also states she is having lower abdominal pain and tooth pain.  The pt states she always has some baseline level of shortness of breath but it has been much worse over the last 2 days. Feels like she cannot catch her breath easily. She states she is noticing some left ankle swelling as well. Propping feet has not helped. The pt does not weigh herself to know if she has gained any weight.  Denies chest pain, dizziness. She is not able to take vitals at home.   Advised the pt to seek evaluation at Urgent Care or Emergency Room. Appointment made with Coletta Memos, NP on Wednesday 05/09/20 at 1:45 p.m. Also advised pt to contact primary care provider to manage abdominal pain. Pt verbalizes understanding.

## 2020-05-08 DIAGNOSIS — R1031 Right lower quadrant pain: Secondary | ICD-10-CM | POA: Diagnosis not present

## 2020-05-08 DIAGNOSIS — Z9889 Other specified postprocedural states: Secondary | ICD-10-CM | POA: Diagnosis not present

## 2020-05-08 NOTE — Progress Notes (Deleted)
Cardiology Clinic Note   Patient Name: Kimberly [name] Date of Encounter: 05/08/2020  Primary Care Provider:  Patient, No Pcp Per Primary Cardiologist:  Sanda Klein, MD  Patient Profile    Kimberly [name] 87 year old female presents to the clinic today for evaluation of her shortness of breath.  Past Medical History    Past Medical History:  Diagnosis Date  . Acute blood loss anemia 10/11/2012  . Acute diverticulitis 08/24/2013  . Acute on chronic combined systolic and diastolic CHF, NYHA class 4 ([place]) 11/15/2013  . Antral ulcer 10/11/2012  . Arrhythmia    atrial fibb  . Atrial fibrillation ([place])   . Bipolar affective disorder ([place]) 03/23/2017  . Cardiomyopathy, nonischemic ([place])   . Chronic anticoagulation 10/12/2012  . CKD (chronic kidney disease) stage 3, GFR 30-59 ml/min ([facility]) 10/12/2012  . Depression   . Erosive esophagitis 10/11/2012  . Fibromyalgia   . Glaucoma   . H/O echocardiogram 2007   EF 40-45%,         . Hypertension   . Osteoarthritis   . Pacemaker    Last saw cards 07/2013  . Scoliosis    Past Surgical History:  Procedure Laterality Date  . ABDOMINAL HYSTERECTOMY    . APPENDECTOMY    . BACK SURGERY    . BIOPSY  03/03/2017   Procedure: BIOPSY;  Surgeon: Danie Binder, MD;  [facility]: [facility];  Service: Endoscopy;;  gastric  . BREAST SURGERY    . CARDIAC CATHETERIZATION  12/08/2005   LAD AND LEFT MAIN WITH NO HIGH-GRADE STENOSIS. MILD DISEASE IN THE CX AND LAD SYSTEM. SEVERE LV DYSFUNCTION WITH DILATION OF THE LV. EF 15-20%. LV END-DIASTOLIC PRESSURE IS 90. +1 MR.  . CHOLECYSTECTOMY    . COLONOSCOPY N/A 03/03/2017   Dr. Oneida Alar: 5 colon polyps removed, adenomatous.  Diverticulosis.  99% of the colon was cleared but the cecum was not adequately seen.  . CYSTOSCOPY N/A 02/24/2013   Procedure: CYSTOSCOPY WITH URETHRAL DILITATION;  Surgeon: Marissa Nestle, MD;  Location: [facility];  Service: Urology;  Laterality: N/A;  . DOPPLER ECHOCARDIOGRAPHY N/A  05/30/2010   LV SIZE IS NORMAL. LV SYSTOLIC FUNCTION IS LOW NORMAL. EF=50-55%. MILD INFERIOR HYPOKINESIS.MILD TO MODERATE POSTERIOR WALL HYPOKINESIS.PACEMAKER LEAD IN THE RV. LA IS MILDLY DILATED. RA IS MODERATE TO SEVERLY DILATED. PACEMAKER LEAD IN THE RA. MILD CALCICICATION OF THE MV APPARATUS. MODERATE MR. MILD TO MODERATE TR. MILD PHTN.AV MILDLY SCLEROTIC.  [name] ESOPHAGOGASTRODUODENOSCOPY N/A 10/13/2012   Dr. Gala Romney: severe ulcerative reflux esophagitis, question of Barrett's but negative path, single deep prepyloric antral ulcer, negative H.pylori  . ESOPHAGOGASTRODUODENOSCOPY N/A 03/03/2017   Dr. Oneida Alar: Gastritis/duodenitis, no H. pylori  . HERNIA REPAIR     right inguinal hernia and umbilical  . LOWETR EXT VENOUS Bilateral 11-08-10   R & L- NO EVIDENCE OF THROMBUS OR THROMBOPHLEBITIS. THERE IS MILD AMOUNT OF SUBCUTANEOUS EDEMA NOTED WITHIN THE LEFT CALF AND ANKLE. R & L GSV AND SSV- NO VENOUS INSUFF NOTED.  [name] NECK SURGERY    . NUCLEAR STRESS TEST N/A 02/13/2009   NORMAL PATTERN OF PERFUSION IN ALL REGIONS. POST STRESS VENTICULAR SIZE IS NORMAL. POST  STESS EF 85%.  NORMAL MYOCARDIAL PERFUSION STUDY.  [name] OPEN REDUCTION INTERNAL FIXATION (ORIF) DISTAL PHALANX Left 11/16/2018   Procedure: MIDDLE FINGER OPEN REDUCTION VERSUS RECONSTRUCTION;  Surgeon: Roseanne Kaufman, MD;  Location: [facility];  Service: Orthopedics;  Laterality: Left;  . PACEMAKER INSERTION    . POLYPECTOMY  03/03/2017  Procedure: POLYPECTOMY;  Surgeon: Danie Binder, MD;  [facility];  Service: Endoscopy;;  colon  . TONSILLECTOMY    . YAG LASER APPLICATION Bilateral 4/81/8563   Procedure: YAG LASER APPLICATION;  Surgeon: Williams Che, MD;  Location: [facility];  Service: Ophthalmology;  Laterality: Bilateral;    Allergies  Allergies  Allergen Reactions  . Ciprofloxacin Other (See Comments)    Possibly caused diarrhea November 2018  . Flagyl [Metronidazole] Other (See Comments)    Possibly caused diarrhea November  2018  . Papaya Derivatives Hives  . Iodine Rash and Other (See Comments)    REACTION:If injected,  Rash/irritated skin reaction "welts"  . Penicillins Hives    DID THE REACTION INVOLVE: Swelling of the face/tongue/throat, SOB, or low BP? Unknown Sudden or severe rash/hives, skin peeling, or the inside of the mouth or nose? Yes Did it require medical treatment? Yes When did it last happen?50 or 87 years old If all above answers are "NO", may proceed with cephalosporin use.  . Sulfa Antibiotics Rash    History of Present Illness    Kimberly [name] has a PMH of permanent atrial fibrillation with AV nodal ablation and secondary complete heart block, BiV PPM (1497), diastolic CHF, nonischemic cardiomyopathy, and rheumatoid arthritis.  She was seen by Dr. Sallyanne Kuster on 02/27/2020.  During that time her optive all was in normal range.  There have been no episodes of high ventricular rates and her generator longevity was about 4 months.  Her main complaint was of her swelling and tenderness in her arthritic hands.  It was planned that her monthly downloads would continue in anticipation for generator change out.  She contacted the nurse triage line on 05/07/2020 and indicated that she was having increased shortness of breath over the last several days.  She noted the increased work of breathing with activity such as sitting and exertion.  She also reported lower abdominal pain.  Her pacemaker was remotely interrogated by Dr. Sallyanne Kuster which showed her OptiVol was out of range suggesting fluid volume overload.  She was pacing 99%.  Her device showed normal function with 3 months of battery life.  She presents the clinic today for follow-up evaluation states***  Today she denies chest pain, shortness of breath, lower extremity edema, fatigue, palpitations, melena, hematuria, hemoptysis, diaphoresis, weakness, presyncope, syncope, orthopnea, and PND.   Home Medications    Prior to Admission medications    Medication Sig Start Date End Date Taking? Authorizing Provider  acetaminophen (TYLENOL) 325 MG tablet Take 2 tablets (650 mg total) by mouth every 6 (six) hours as needed for mild pain, fever or headache (or Fever >/= 101). 10/17/18   Roxan Hockey, MD  apixaban (ELIQUIS) 2.5 MG TABS tablet Take 1 tablet (2.5 mg total) by mouth 2 (two) times daily. 02/03/20   Croitoru, Mihai, MD  calcium carbonate (TUMS - DOSED IN MG ELEMENTAL CALCIUM) 500 MG chewable tablet Chew 1 tablet by mouth daily.    [provider]  carvedilol (COREG) 6.25 MG tablet TAKE 1 TABLET BY MOUTH  TWICE DAILY WITH A MEAL 01/26/20   Croitoru, Mihai, MD  furosemide (LASIX) 20 MG tablet Take 1 tablet (20 mg total) by mouth daily. 02/06/20   Croitoru, Mihai, MD  Multiple Vitamins-Minerals (PRESERVISION AREDS 2) CAPS Take 1 capsule by mouth 2 (two) times daily.     [provider]  Polyethylene Glycol 400 (BLINK TEARS) 0.25 % SOLN Apply 1 drop to eye daily as needed (for dry  eye relief).    [provider]  traMADol (ULTRAM) 50 MG tablet Take 1 tablet (50 mg total) by mouth every 6 (six) hours as needed. 11/16/19   Noemi Chapel, MD    Family History    Family History  Problem Relation Age of Onset  . Cancer Sister   . Asthma Sister   . Heart failure Brother   . Pulmonary embolism Brother   . Cancer Brother   . Colon cancer Neg Hx    She indicated that her mother is deceased. She indicated that her father is deceased. She indicated that both of her sisters are deceased. She indicated that all of her four brothers are deceased. She indicated that the status of her neg hx is unknown.  Social History    Social History   Socioeconomic History  . Marital status: Widowed    Spouse name: Not on file  . Number of children: Not on file  . Years of education: Not on file  . Highest education level: Some college, no degree  Occupational History  . Occupation: IT trainer: RETIRED     Comment: retired  Tobacco Use  . Smoking status: Former Smoker    Packs/day: 0.50    Years: 30.00    Pack years: 15.00    Types: Cigarettes    Quit date: 12/13/2004    Years since quitting: 15.4  . Smokeless tobacco: Never Used  . Tobacco comment: former smoker  Vaping Use  . Vaping Use: Never used  Substance and Sexual Activity  . Alcohol use: Yes    Alcohol/week: 0.0 standard drinks    Comment: history of drinking a 1/2 glass of wine in the evening  . Drug use: No  . Sexual activity: Never  Other Topics Concern  . Not on file  Social History Narrative   No home exercise program. PT ordered this week.   Social Determinants of Health   Financial Resource Strain: Not on file  Food Insecurity: Not on file  Transportation Needs: No Transportation Needs  . Lack of Transportation (Medical): No  . Lack of Transportation (Non-Medical): No  Physical Activity: Inactive  . Days of Exercise per Week: 0 days  . Minutes of Exercise per Session: 0 min  Stress: Not on file  Social Connections: Unknown  . Frequency of Communication with Friends and Family: Twice a week  . Frequency of Social Gatherings with Friends and Family: Once a week  . Attends Religious Services: Never  . Active Member of Clubs or Organizations: No  . Attends Archivist Meetings: Not on file  . Marital Status: Not on file  Intimate Partner Violence: Not on file     Review of Systems    General:  No chills, fever, night sweats or weight changes.  Cardiovascular:  No chest pain, dyspnea on exertion, edema, orthopnea, palpitations, paroxysmal nocturnal dyspnea. Dermatological: No rash, lesions/masses Respiratory: No cough, dyspnea Urologic: No hematuria, dysuria Abdominal:   No nausea, vomiting, diarrhea, bright red blood per rectum, melena, or hematemesis Neurologic:  No visual changes, wkns, changes in mental status. All other systems reviewed and are otherwise negative except as noted  above.  Physical Exam    VS:  There were no vitals taken for this visit. , BMI There is no height or weight on file to calculate BMI. GEN: Well nourished, well developed, in no acute distress. HEENT: normal. Neck: Supple, no JVD, carotid bruits, or masses. Cardiac: RRR, no murmurs,  rubs, or gallops. No clubbing, cyanosis, edema.  Radials/DP/PT 2+ and equal bilaterally.  Respiratory:  Respirations regular and unlabored, clear to auscultation bilaterally. GI: Soft, nontender, nondistended, BS + x 4. MS: no deformity or atrophy. Skin: warm and dry, no rash. Neuro:  Strength and sensation are intact. Psych: Normal affect.  Accessory Clinical Findings    Recent Labs: 10/09/2019: ALT 19 02/03/2020: BNP 293.7; BUN 27; Creatinine, Ser 1.64; Hemoglobin 9.8; Platelets 285; Potassium 3.5; Sodium 137   Recent Lipid Panel    Component Value Date/Time   CHOL 179 03/15/2018 1645   TRIG 65 03/15/2018 1645   HDL 84 03/15/2018 1645   CHOLHDL 2.1 03/15/2018 1645   CHOLHDL 2.3 02/06/2009 0450   VLDL 16 02/06/2009 0450   LDLCALC 82 03/15/2018 1645    ECG personally reviewed by me today- *** - No acute changes  Remote device check 05/07/2020 Scheduled remote reviewed. Normal device function.  Est time to RRT - 3 months - routing to triage to increase follow up frequency  2 NSVT lasting 8 to 11 beats w/ rates 120's to 150's bpm   Next remote 31 days.  Echocardiogram 11/21/2019 IMPRESSIONS    1. Left ventricular ejection fraction, by estimation, is 55 to 60%. The  left ventricle has normal function. The left ventricle has no regional  wall motion abnormalities. Left ventricular diastolic function could not  be evaluated.  2. Right ventricular systolic function is normal. The right ventricular  size is normal. There is mildly elevated pulmonary artery systolic  pressure. The estimated right ventricular systolic pressure is 95.0 mmHg.  3. Left atrial size was severely dilated.  4. Right  atrial size was severely dilated.  5. The mitral valve is degenerative. Mild mitral valve regurgitation. No  evidence of mitral stenosis.  6. The aortic valve is tricuspid. Aortic valve regurgitation is not  visualized. Mild to moderate aortic valve sclerosis/calcification is  present, without any evidence of aortic stenosis.  7. The inferior vena cava is normal in size with greater than 50%  respiratory variability, suggesting right atrial pressure of 3 mmHg.   Comparison(s): No significant change from prior study. Prior EF 60-65%  (2016).    Assessment & Plan   1.  Acute on chronic diastolic CHF-bilateral lower extremity ***.  Has noticed weight increase of***.  OptiVol shows that he has been out of range for the last 1-2 weeks. Increase furosemide to 40 mg daily x3 days then return to normal 20 mg dosing Start potassium 10 mEq x 3 days then stop Heart healthy low-sodium diet-salty 6 given Increase physical activity as tolerated Repeat BMP in 1 week Daily weights-contact office with a weight increase of 3 pounds overnight or 5 pounds in 1 week Fluid restriction 48-64 ounces daily  Complete heart block-pacemaker dependent after AV node ablation.  Device interrogation 05/07/2018 showed battery life of 3 months remaining. Continue monthly remote device checks  Disposition: Follow-up with Dr. Sallyanne Kuster or me in 1 week and Dr. Sallyanne Kuster in 2 months.  Jossie Ng. Fendi Meinhardt NP-C    05/08/2020, 7:21 AM [address] Office [phone] Fax [phone]  Notice: This dictation was prepared with Dragon dictation along with smaller phrase technology. Any transcriptional errors that result from this process are unintentional and may not be corrected upon review.

## 2020-05-09 ENCOUNTER — Ambulatory Visit: Payer: Medicare Other | Admitting: General Practice

## 2020-05-12 DIAGNOSIS — R1030 Lower abdominal pain, unspecified: Secondary | ICD-10-CM | POA: Diagnosis not present

## 2020-05-12 DIAGNOSIS — R197 Diarrhea, unspecified: Secondary | ICD-10-CM | POA: Diagnosis not present

## 2020-05-12 DIAGNOSIS — K7689 Other specified diseases of liver: Secondary | ICD-10-CM | POA: Diagnosis not present

## 2020-05-12 DIAGNOSIS — K5730 Diverticulosis of large intestine without perforation or abscess without bleeding: Secondary | ICD-10-CM | POA: Diagnosis not present

## 2020-05-12 DIAGNOSIS — K449 Diaphragmatic hernia without obstruction or gangrene: Secondary | ICD-10-CM | POA: Diagnosis not present

## 2020-05-12 DIAGNOSIS — K922 Gastrointestinal hemorrhage, unspecified: Secondary | ICD-10-CM | POA: Diagnosis not present

## 2020-05-12 DIAGNOSIS — Z20822 Contact with and (suspected) exposure to covid-19: Secondary | ICD-10-CM | POA: Diagnosis not present

## 2020-05-12 DIAGNOSIS — R1032 Left lower quadrant pain: Secondary | ICD-10-CM | POA: Diagnosis not present

## 2020-05-12 DIAGNOSIS — N200 Calculus of kidney: Secondary | ICD-10-CM | POA: Diagnosis not present

## 2020-05-12 DIAGNOSIS — Z9049 Acquired absence of other specified parts of digestive tract: Secondary | ICD-10-CM | POA: Diagnosis not present

## 2020-05-12 DIAGNOSIS — R6889 Other general symptoms and signs: Secondary | ICD-10-CM | POA: Diagnosis not present

## 2020-05-12 DIAGNOSIS — Z743 Need for continuous supervision: Secondary | ICD-10-CM | POA: Diagnosis not present

## 2020-05-12 DIAGNOSIS — R109 Unspecified abdominal pain: Secondary | ICD-10-CM | POA: Diagnosis not present

## 2020-05-17 DIAGNOSIS — Z952 Presence of prosthetic heart valve: Secondary | ICD-10-CM | POA: Diagnosis not present

## 2020-05-17 DIAGNOSIS — K922 Gastrointestinal hemorrhage, unspecified: Secondary | ICD-10-CM | POA: Diagnosis not present

## 2020-05-17 DIAGNOSIS — M4854XS Collapsed vertebra, not elsewhere classified, thoracic region, sequela of fracture: Secondary | ICD-10-CM | POA: Diagnosis not present

## 2020-05-17 DIAGNOSIS — Z880 Allergy status to penicillin: Secondary | ICD-10-CM | POA: Diagnosis not present

## 2020-05-17 DIAGNOSIS — Z882 Allergy status to sulfonamides status: Secondary | ICD-10-CM | POA: Diagnosis not present

## 2020-05-17 DIAGNOSIS — K921 Melena: Secondary | ICD-10-CM | POA: Diagnosis not present

## 2020-05-17 DIAGNOSIS — D649 Anemia, unspecified: Secondary | ICD-10-CM | POA: Diagnosis not present

## 2020-05-17 DIAGNOSIS — R1030 Lower abdominal pain, unspecified: Secondary | ICD-10-CM | POA: Diagnosis not present

## 2020-05-17 DIAGNOSIS — D62 Acute posthemorrhagic anemia: Secondary | ICD-10-CM | POA: Diagnosis not present

## 2020-05-17 DIAGNOSIS — R7989 Other specified abnormal findings of blood chemistry: Secondary | ICD-10-CM | POA: Diagnosis not present

## 2020-05-17 DIAGNOSIS — R1031 Right lower quadrant pain: Secondary | ICD-10-CM | POA: Diagnosis not present

## 2020-05-17 DIAGNOSIS — K5730 Diverticulosis of large intestine without perforation or abscess without bleeding: Secondary | ICD-10-CM | POA: Diagnosis not present

## 2020-05-17 DIAGNOSIS — Z20822 Contact with and (suspected) exposure to covid-19: Secondary | ICD-10-CM | POA: Diagnosis not present

## 2020-05-17 DIAGNOSIS — N200 Calculus of kidney: Secondary | ICD-10-CM | POA: Diagnosis not present

## 2020-05-17 DIAGNOSIS — K449 Diaphragmatic hernia without obstruction or gangrene: Secondary | ICD-10-CM | POA: Diagnosis not present

## 2020-05-17 DIAGNOSIS — Z91041 Radiographic dye allergy status: Secondary | ICD-10-CM | POA: Diagnosis not present

## 2020-05-17 DIAGNOSIS — I509 Heart failure, unspecified: Secondary | ICD-10-CM | POA: Diagnosis not present

## 2020-05-17 DIAGNOSIS — Z950 Presence of cardiac pacemaker: Secondary | ICD-10-CM | POA: Diagnosis not present

## 2020-05-17 DIAGNOSIS — X58XXXS Exposure to other specified factors, sequela: Secondary | ICD-10-CM | POA: Diagnosis not present

## 2020-05-17 DIAGNOSIS — I700 Atherosclerosis of aorta: Secondary | ICD-10-CM | POA: Diagnosis not present

## 2020-05-17 DIAGNOSIS — Z7901 Long term (current) use of anticoagulants: Secondary | ICD-10-CM | POA: Diagnosis not present

## 2020-05-18 DIAGNOSIS — Z880 Allergy status to penicillin: Secondary | ICD-10-CM | POA: Diagnosis not present

## 2020-05-18 DIAGNOSIS — Z7901 Long term (current) use of anticoagulants: Secondary | ICD-10-CM | POA: Diagnosis not present

## 2020-05-18 DIAGNOSIS — N200 Calculus of kidney: Secondary | ICD-10-CM | POA: Diagnosis not present

## 2020-05-18 DIAGNOSIS — I700 Atherosclerosis of aorta: Secondary | ICD-10-CM | POA: Diagnosis not present

## 2020-05-18 DIAGNOSIS — K5730 Diverticulosis of large intestine without perforation or abscess without bleeding: Secondary | ICD-10-CM | POA: Diagnosis not present

## 2020-05-18 DIAGNOSIS — Z91041 Radiographic dye allergy status: Secondary | ICD-10-CM | POA: Diagnosis not present

## 2020-05-18 DIAGNOSIS — Z882 Allergy status to sulfonamides status: Secondary | ICD-10-CM | POA: Diagnosis not present

## 2020-05-18 DIAGNOSIS — M4854XS Collapsed vertebra, not elsewhere classified, thoracic region, sequela of fracture: Secondary | ICD-10-CM | POA: Diagnosis not present

## 2020-05-18 DIAGNOSIS — Z952 Presence of prosthetic heart valve: Secondary | ICD-10-CM | POA: Diagnosis not present

## 2020-05-18 DIAGNOSIS — K922 Gastrointestinal hemorrhage, unspecified: Secondary | ICD-10-CM | POA: Diagnosis not present

## 2020-05-18 DIAGNOSIS — Z950 Presence of cardiac pacemaker: Secondary | ICD-10-CM | POA: Diagnosis not present

## 2020-05-18 DIAGNOSIS — K449 Diaphragmatic hernia without obstruction or gangrene: Secondary | ICD-10-CM | POA: Diagnosis not present

## 2020-05-18 DIAGNOSIS — X58XXXS Exposure to other specified factors, sequela: Secondary | ICD-10-CM | POA: Diagnosis not present

## 2020-05-18 DIAGNOSIS — R1031 Right lower quadrant pain: Secondary | ICD-10-CM | POA: Diagnosis not present

## 2020-05-19 DIAGNOSIS — N200 Calculus of kidney: Secondary | ICD-10-CM | POA: Diagnosis not present

## 2020-05-19 DIAGNOSIS — Z950 Presence of cardiac pacemaker: Secondary | ICD-10-CM | POA: Diagnosis not present

## 2020-05-19 DIAGNOSIS — K922 Gastrointestinal hemorrhage, unspecified: Secondary | ICD-10-CM | POA: Diagnosis not present

## 2020-05-19 DIAGNOSIS — K449 Diaphragmatic hernia without obstruction or gangrene: Secondary | ICD-10-CM | POA: Diagnosis not present

## 2020-05-19 DIAGNOSIS — M4854XS Collapsed vertebra, not elsewhere classified, thoracic region, sequela of fracture: Secondary | ICD-10-CM | POA: Diagnosis not present

## 2020-05-19 DIAGNOSIS — Z880 Allergy status to penicillin: Secondary | ICD-10-CM | POA: Diagnosis not present

## 2020-05-19 DIAGNOSIS — R1031 Right lower quadrant pain: Secondary | ICD-10-CM | POA: Diagnosis not present

## 2020-05-19 DIAGNOSIS — I700 Atherosclerosis of aorta: Secondary | ICD-10-CM | POA: Diagnosis not present

## 2020-05-19 DIAGNOSIS — Z7901 Long term (current) use of anticoagulants: Secondary | ICD-10-CM | POA: Diagnosis not present

## 2020-05-19 DIAGNOSIS — Z952 Presence of prosthetic heart valve: Secondary | ICD-10-CM | POA: Diagnosis not present

## 2020-05-19 DIAGNOSIS — Z91041 Radiographic dye allergy status: Secondary | ICD-10-CM | POA: Diagnosis not present

## 2020-05-19 DIAGNOSIS — X58XXXS Exposure to other specified factors, sequela: Secondary | ICD-10-CM | POA: Diagnosis not present

## 2020-05-19 DIAGNOSIS — K5730 Diverticulosis of large intestine without perforation or abscess without bleeding: Secondary | ICD-10-CM | POA: Diagnosis not present

## 2020-05-19 DIAGNOSIS — Z882 Allergy status to sulfonamides status: Secondary | ICD-10-CM | POA: Diagnosis not present

## 2020-05-20 DIAGNOSIS — Z91041 Radiographic dye allergy status: Secondary | ICD-10-CM | POA: Diagnosis not present

## 2020-05-20 DIAGNOSIS — Z882 Allergy status to sulfonamides status: Secondary | ICD-10-CM | POA: Diagnosis not present

## 2020-05-20 DIAGNOSIS — Z952 Presence of prosthetic heart valve: Secondary | ICD-10-CM | POA: Diagnosis not present

## 2020-05-20 DIAGNOSIS — M4854XS Collapsed vertebra, not elsewhere classified, thoracic region, sequela of fracture: Secondary | ICD-10-CM | POA: Diagnosis not present

## 2020-05-20 DIAGNOSIS — R1030 Lower abdominal pain, unspecified: Secondary | ICD-10-CM | POA: Diagnosis not present

## 2020-05-20 DIAGNOSIS — N200 Calculus of kidney: Secondary | ICD-10-CM | POA: Diagnosis not present

## 2020-05-20 DIAGNOSIS — Z950 Presence of cardiac pacemaker: Secondary | ICD-10-CM | POA: Diagnosis not present

## 2020-05-20 DIAGNOSIS — R1031 Right lower quadrant pain: Secondary | ICD-10-CM | POA: Diagnosis not present

## 2020-05-20 DIAGNOSIS — Z880 Allergy status to penicillin: Secondary | ICD-10-CM | POA: Diagnosis not present

## 2020-05-20 DIAGNOSIS — K449 Diaphragmatic hernia without obstruction or gangrene: Secondary | ICD-10-CM | POA: Diagnosis not present

## 2020-05-20 DIAGNOSIS — K922 Gastrointestinal hemorrhage, unspecified: Secondary | ICD-10-CM | POA: Diagnosis not present

## 2020-05-20 DIAGNOSIS — K5730 Diverticulosis of large intestine without perforation or abscess without bleeding: Secondary | ICD-10-CM | POA: Diagnosis not present

## 2020-05-20 DIAGNOSIS — X58XXXS Exposure to other specified factors, sequela: Secondary | ICD-10-CM | POA: Diagnosis not present

## 2020-05-20 DIAGNOSIS — Z7901 Long term (current) use of anticoagulants: Secondary | ICD-10-CM | POA: Diagnosis not present

## 2020-05-20 DIAGNOSIS — I700 Atherosclerosis of aorta: Secondary | ICD-10-CM | POA: Diagnosis not present

## 2020-05-23 ENCOUNTER — Telehealth: Payer: Self-pay | Admitting: Cardiovascular Disease

## 2020-05-23 ENCOUNTER — Encounter: Payer: Self-pay | Admitting: Internal Medicine

## 2020-05-23 NOTE — Telephone Encounter (Signed)
Pt c/o medication issue:  1. Name of Medication: apixaban (ELIQUIS) 2.5 MG TABS tablet  2. How are you currently taking this medication (dosage and times per day)? Pt just taking 1 tablet daily instead of 2  3. Are you having a reaction (difficulty breathing--STAT)?   4. What is your medication issue? Pt went to the hospital for internal bleeding. She said she had to have a transfusion. She wanted to know if she should go back to taking her eliquis twice daily. She decided on her own to take the eliquis once daily.  Pt has an appt to see Dr. Loletha Grayer 06/12/20 but is not sure what to do until then, Please advise

## 2020-05-24 NOTE — Telephone Encounter (Signed)
Patient states she has pain in the right side of her belly area towards the groin. She states she had a blood transfusion in the hospital, because she was losing blood. She would like to know if she should be taking her eliquis. She states her side "hurts like the dickens". Please advise.

## 2020-05-24 NOTE — Telephone Encounter (Signed)
Absolutely agree. No eliquis until re-evaluated by GI

## 2020-05-24 NOTE — Telephone Encounter (Signed)
Spoke to patient. Informed her  To follow instruction not restart Eliquis until she sees  GI. Patient states she goes to [facility] in [place]. She states he has not called to set appointment.  RN informed patient not to use Eliquis and call  Office to set up appointment and to informed them that her side was hurting.   she verbalized understanding  And states she will call now.

## 2020-05-24 NOTE — Telephone Encounter (Signed)
Agreed  HOLD eliquis until okay by GI

## 2020-05-24 NOTE — Telephone Encounter (Signed)
Spoke to patient. Patient aware. ? ?

## 2020-05-25 ENCOUNTER — Telehealth: Payer: Medicare Other | Admitting: Cardiovascular Disease

## 2020-05-25 ENCOUNTER — Telehealth: Payer: Self-pay | Admitting: *Deleted

## 2020-05-25 DIAGNOSIS — R109 Unspecified abdominal pain: Secondary | ICD-10-CM | POA: Diagnosis not present

## 2020-05-25 DIAGNOSIS — Z743 Need for continuous supervision: Secondary | ICD-10-CM | POA: Diagnosis not present

## 2020-05-25 DIAGNOSIS — R1031 Right lower quadrant pain: Secondary | ICD-10-CM | POA: Diagnosis not present

## 2020-05-25 DIAGNOSIS — Z79899 Other long term (current) drug therapy: Secondary | ICD-10-CM | POA: Diagnosis not present

## 2020-05-25 DIAGNOSIS — Z950 Presence of cardiac pacemaker: Secondary | ICD-10-CM | POA: Diagnosis not present

## 2020-05-25 DIAGNOSIS — R0902 Hypoxemia: Secondary | ICD-10-CM | POA: Diagnosis not present

## 2020-05-25 DIAGNOSIS — N200 Calculus of kidney: Secondary | ICD-10-CM | POA: Diagnosis not present

## 2020-05-25 DIAGNOSIS — E876 Hypokalemia: Secondary | ICD-10-CM | POA: Diagnosis not present

## 2020-05-25 DIAGNOSIS — Z881 Allergy status to other antibiotic agents status: Secondary | ICD-10-CM | POA: Diagnosis not present

## 2020-05-25 DIAGNOSIS — R6889 Other general symptoms and signs: Secondary | ICD-10-CM | POA: Diagnosis not present

## 2020-05-25 DIAGNOSIS — R531 Weakness: Secondary | ICD-10-CM | POA: Diagnosis not present

## 2020-05-25 DIAGNOSIS — Z91041 Radiographic dye allergy status: Secondary | ICD-10-CM | POA: Diagnosis not present

## 2020-05-25 DIAGNOSIS — Z882 Allergy status to sulfonamides status: Secondary | ICD-10-CM | POA: Diagnosis not present

## 2020-05-25 DIAGNOSIS — Z880 Allergy status to penicillin: Secondary | ICD-10-CM | POA: Diagnosis not present

## 2020-05-25 DIAGNOSIS — R1032 Left lower quadrant pain: Secondary | ICD-10-CM | POA: Diagnosis not present

## 2020-05-25 DIAGNOSIS — K5790 Diverticulosis of intestine, part unspecified, without perforation or abscess without bleeding: Secondary | ICD-10-CM | POA: Diagnosis not present

## 2020-05-25 DIAGNOSIS — N179 Acute kidney failure, unspecified: Secondary | ICD-10-CM | POA: Diagnosis not present

## 2020-05-25 DIAGNOSIS — R197 Diarrhea, unspecified: Secondary | ICD-10-CM | POA: Diagnosis not present

## 2020-05-25 DIAGNOSIS — D7389 Other diseases of spleen: Secondary | ICD-10-CM | POA: Diagnosis not present

## 2020-05-25 DIAGNOSIS — K449 Diaphragmatic hernia without obstruction or gangrene: Secondary | ICD-10-CM | POA: Diagnosis not present

## 2020-05-25 DIAGNOSIS — R279 Unspecified lack of coordination: Secondary | ICD-10-CM | POA: Diagnosis not present

## 2020-05-25 DIAGNOSIS — E860 Dehydration: Secondary | ICD-10-CM | POA: Diagnosis not present

## 2020-05-25 NOTE — Telephone Encounter (Signed)
Left a message times 3 for the patient to call back for her virtual appointment this morning with Dr. Sallyanne Kuster. The patient was unable to be reached.

## 2020-05-26 DIAGNOSIS — R109 Unspecified abdominal pain: Secondary | ICD-10-CM | POA: Diagnosis not present

## 2020-05-28 ENCOUNTER — Telehealth: Payer: Self-pay | Admitting: Cardiovascular Disease

## 2020-05-28 NOTE — Telephone Encounter (Signed)
Pt called stating she has developed swelling and a puss filled blister on her hand where she had surgery a couple of years ago. Pt questioning what type of pain medication she can take to help relieve symptoms.   Nurse instructed pt that she should be ok to take tylenol but to try to avoid NSAIDS. Nurse also informed pt that a message would be routed to pharm D for clarification but to contact PCP as she may need to have her hand assessed. Pt verbalized understanding.

## 2020-05-28 NOTE — Telephone Encounter (Signed)
Patient is calling to see what type of pain reliever she can take due to her have a pacemaker. Please call back

## 2020-05-28 NOTE — Telephone Encounter (Signed)
Agree with trying Tylenol as opposed to NSAIDs.  However patient sounds like her hand may have an infection and she should be assessed by PCP to make sure it does not progress.

## 2020-05-28 NOTE — Telephone Encounter (Signed)
Pt updated with PharmD recommendations and verbalized understanding. Pt also report she has an appointment scheduled tomorrow with PCP for further evaluations.

## 2020-05-29 ENCOUNTER — Telehealth: Payer: Self-pay | Admitting: Cardiovascular Disease

## 2020-05-29 DIAGNOSIS — S63253A Unspecified dislocation of left middle finger, initial encounter: Secondary | ICD-10-CM | POA: Diagnosis not present

## 2020-05-29 DIAGNOSIS — N183 Chronic kidney disease, stage 3 unspecified: Secondary | ICD-10-CM | POA: Diagnosis not present

## 2020-05-29 DIAGNOSIS — I442 Atrioventricular block, complete: Secondary | ICD-10-CM | POA: Diagnosis not present

## 2020-05-29 DIAGNOSIS — D62 Acute posthemorrhagic anemia: Secondary | ICD-10-CM | POA: Diagnosis not present

## 2020-05-29 DIAGNOSIS — Z950 Presence of cardiac pacemaker: Secondary | ICD-10-CM | POA: Diagnosis not present

## 2020-05-29 DIAGNOSIS — K5791 Diverticulosis of intestine, part unspecified, without perforation or abscess with bleeding: Secondary | ICD-10-CM | POA: Diagnosis not present

## 2020-05-29 DIAGNOSIS — N179 Acute kidney failure, unspecified: Secondary | ICD-10-CM | POA: Diagnosis not present

## 2020-05-29 DIAGNOSIS — M1A9XX1 Chronic gout, unspecified, with tophus (tophi): Secondary | ICD-10-CM | POA: Diagnosis not present

## 2020-05-29 NOTE — Telephone Encounter (Signed)
Pt c/o Shortness Of Breath: STAT if SOB developed within the last 24 hours or pt is noticeably SOB on the phone  1. Are you currently SOB (can you hear that pt is SOB on the phone)? Yes and yes   2. How long have you been experiencing SOB? For a while  3. Are you SOB when sitting or when up moving around? Both and talking   4. Are you currently experiencing any other symptoms? Just SOB

## 2020-05-29 NOTE — Telephone Encounter (Signed)
Returned call to patient she stated she has been sob for the past 1 month or more.Stated sob getting worse. No fast heart beat.Advised she has appointment scheduled with Dr.Croitoru 06/12/20 at 3:00 pm.Patient stated she was not aware of appointment.She will keep appointment as planned.

## 2020-05-31 ENCOUNTER — Telehealth: Payer: Self-pay | Admitting: Cardiovascular Disease

## 2020-05-31 NOTE — Telephone Encounter (Signed)
Returned call to patient who reports HA for the few weeks.  Having HA daily.  She reports taking tylenol and ibuprofen without relief.   No history of migraines.  Denies change in vision, blurry vision, dizziness, lightheadedness, N/V.    Does not check BP at home.   Advised to contact PCP to discuss ongoing headache without relief from home medications.  Patient verbalized understanding.

## 2020-05-31 NOTE — Telephone Encounter (Signed)
New message:    Patient calling stating that she need to speak with some concering a medication. Please call patient.

## 2020-06-04 ENCOUNTER — Telehealth: Payer: Self-pay | Admitting: Licensed Clinical Social Worker

## 2020-06-04 ENCOUNTER — Telehealth: Payer: Self-pay | Admitting: Cardiovascular Disease

## 2020-06-04 NOTE — Telephone Encounter (Signed)
Spoke with Aleneva from [facility]. Spots were filled for 06/06/20. [name] states she cannot bring patient to appointment on 06/12/20. Message will be sent to [place], [place] to see if transportation can be arranged. Message sent to Dr. Sallyanne Kuster to see if he recommends any changes before next appointment.

## 2020-06-04 NOTE — Progress Notes (Signed)
Heart and Vascular Care Navigation  06/04/2020  LORANDA MASTEL [id number] [id number]  Reason for Referral:  Transportation needs                                                                                                    Assessment:             [name] reached out to pt at request of Lonn [place], [place]. Pt has an upcoming appointment on 2/8. Unfortunately her DSS transportation isnt going to be able to arrange her a ride per nurse.   [name] reached out to pt via telephone at [phone]. Introduced self, role, reason for call. Confirmed home address, that pt has a PCP in [place], and that her daughter is still a good contact (daughter is currently RV'ing around the country with her spouse and pets). Pt states she gets around with a walker and states she gets by. No concerns noted other than being able to get to her appointment on 2/8 here at [facility] office. I explained [employer] and pt is in agreement with her referral being sent to [employer]. [name] will also mail my card and Transportation Services card for pt.   No additional needs at this time.                           HRT/VAS Care Coordination    Patients Home Cardiology Office [place] Team Social Worker   Social Worker Name: Margarito Liner [place], [phone]   Living arrangements for the past 2 months Single Family Home   Lives with: Self   Patient Current Insurance Coverage Managed Medicare   Patient Has Concern With Paying Medical Bills No   Does Patient Have Prescription Coverage? Yes   Home Assistive Devices/Equipment Walker (specify type)   DME Agency NA   [place] Agency NA   Current home services DME      Social History:                                                                             SDOH Screenings   Alcohol Screen: Not on file  Depression (PFX9-0): Not on file  Financial Resource Strain: Low Risk   . Difficulty of Paying  Living Expenses: Not very hard  Food Insecurity: No Food Insecurity  . Worried About Charity fundraiser in the Last Year: Never true  . Ran Out of Food in the Last Year: Never true  Housing: Low Risk   . Last Housing Risk Score: 0  Physical Activity: Inactive  . Days of Exercise per Week: 0 days  . Minutes of Exercise per Session: 0 min  Social Connections: Unknown  . Frequency of Communication with Friends and  Family: Twice a week  . Frequency of Social Gatherings with Friends and Family: Once a week  . Attends Religious Services: Never  . Active Member of Clubs or Organizations: No  . Attends Archivist Meetings: Not on file  . Marital Status: Not on file  Stress: Not on file  Tobacco Use: Medium Risk  . Smoking Tobacco Use: Former Smoker  . Smokeless Tobacco Use: Never Used  Transportation Needs: Unmet Transportation Needs  . Lack of Transportation (Medical): Yes  . Lack of Transportation (Non-Medical): No    SDOH Interventions: Financial Resources:  Sales promotion account executive Interventions: Intervention Not Indicated  Food Insecurity:  Food Insecurity Interventions: Intervention Not Indicated  Housing Insecurity:  Housing Interventions: Intervention Not Indicated  Transportation:   Transportation Interventions: Financial planner    Follow-up plan:   [name] has mailed contact information to pt. Pt referral sent to [employer]. I also will follow up with her prior to appointment to ensure ride scheduled.

## 2020-06-04 NOTE — Telephone Encounter (Signed)
Spoke with patient and relayed Dr. Lurline Del recommendations. Patient verbalized understanding.

## 2020-06-04 NOTE — Telephone Encounter (Signed)
Left message for Kimberly [name] to call back.

## 2020-06-04 NOTE — Telephone Encounter (Signed)
Spoke with patient. She reports for the last 2-3 weeks she has been having ankle swelling. She reports 1 week ago she doubled her lasix to see if that would help, it hasn't. She reports she monitors her salt intake, elevates her legs. She does not wear compression stockings. She reports her shortness of breath is worse. If she walks 2 feet she is huffing and puffing and it takes awhile to recover after she sits down. She is able to lay down to sleep. Patient does not have a scale or BP cuff to provide vital signs with. Will route to MD and primary nurse.

## 2020-06-04 NOTE — Telephone Encounter (Signed)
Spoke with patient to move appointment up to 06/06/20 from 06/12/20. Patient to call back after she speaks with her driver.

## 2020-06-04 NOTE — Telephone Encounter (Signed)
Pt c/o swelling: STAT is pt has developed SOB within 24 hours  1) How much weight have you gained and in what time span? Has not gained any weight has actually lost a few pounds   2) If swelling, where is the swelling located? Ankles   3) Are you currently taking a fluid pill? Yes   4) Are you currently SOB? States she always has SOB   5) Do you have a log of your daily weights (if so, list)? No   6) Have you gained 3 pounds in a day or 5 pounds in a week? No   7) Have you traveled recently? No

## 2020-06-04 NOTE — Telephone Encounter (Signed)
Pt called in and stated that we needed to call Baldo Ash ( Her Driver) to see if she is ok with moving this appt up?  Her number is [phone].

## 2020-06-04 NOTE — Telephone Encounter (Signed)
She missed recent appt. OK to book on Wednesday 06/06/20.

## 2020-06-04 NOTE — Telephone Encounter (Signed)
Hard to recommend any other change in meds w/o BP and weight info.  Stay on the double dose of furosemide unti follow up or until edema resolves.

## 2020-06-08 ENCOUNTER — Telehealth: Payer: Self-pay | Admitting: Student

## 2020-06-08 DIAGNOSIS — R1031 Right lower quadrant pain: Secondary | ICD-10-CM | POA: Diagnosis not present

## 2020-06-08 DIAGNOSIS — K5900 Constipation, unspecified: Secondary | ICD-10-CM | POA: Diagnosis not present

## 2020-06-08 DIAGNOSIS — Z743 Need for continuous supervision: Secondary | ICD-10-CM | POA: Diagnosis not present

## 2020-06-08 DIAGNOSIS — R109 Unspecified abdominal pain: Secondary | ICD-10-CM | POA: Diagnosis not present

## 2020-06-08 DIAGNOSIS — R918 Other nonspecific abnormal finding of lung field: Secondary | ICD-10-CM | POA: Diagnosis not present

## 2020-06-08 DIAGNOSIS — R69 Illness, unspecified: Secondary | ICD-10-CM | POA: Diagnosis not present

## 2020-06-08 DIAGNOSIS — R0902 Hypoxemia: Secondary | ICD-10-CM | POA: Diagnosis not present

## 2020-06-08 DIAGNOSIS — K219 Gastro-esophageal reflux disease without esophagitis: Secondary | ICD-10-CM | POA: Diagnosis not present

## 2020-06-08 DIAGNOSIS — R6889 Other general symptoms and signs: Secondary | ICD-10-CM | POA: Diagnosis not present

## 2020-06-08 DIAGNOSIS — R1084 Generalized abdominal pain: Secondary | ICD-10-CM | POA: Diagnosis not present

## 2020-06-08 NOTE — Telephone Encounter (Signed)
   Patient called after hours with concerns about new medications prescribed today. Called and spoke with patient. She was seen in the ED at the [facility] ED today for right upper quadrant pain. I am able to see the note in [place] and was prescribed Lactulose and Famotidine. She just wants to make sure these are OK to take with her cardiac medications and OK to take with her pacemaker. I reviewed home medication list and reassured her that these medications are fine to take and should not interact with her cardiac medications. She thanked me for calling.  Darreld Mclean, PA-C 06/08/2020 7:10 PM

## 2020-06-09 DIAGNOSIS — I10 Essential (primary) hypertension: Secondary | ICD-10-CM | POA: Diagnosis not present

## 2020-06-09 DIAGNOSIS — R1084 Generalized abdominal pain: Secondary | ICD-10-CM | POA: Diagnosis not present

## 2020-06-10 DIAGNOSIS — M419 Scoliosis, unspecified: Secondary | ICD-10-CM | POA: Diagnosis not present

## 2020-06-10 DIAGNOSIS — I700 Atherosclerosis of aorta: Secondary | ICD-10-CM | POA: Diagnosis not present

## 2020-06-10 DIAGNOSIS — K449 Diaphragmatic hernia without obstruction or gangrene: Secondary | ICD-10-CM | POA: Diagnosis not present

## 2020-06-10 DIAGNOSIS — R109 Unspecified abdominal pain: Secondary | ICD-10-CM | POA: Diagnosis not present

## 2020-06-10 DIAGNOSIS — N200 Calculus of kidney: Secondary | ICD-10-CM | POA: Diagnosis not present

## 2020-06-10 DIAGNOSIS — R531 Weakness: Secondary | ICD-10-CM | POA: Diagnosis not present

## 2020-06-10 DIAGNOSIS — Z880 Allergy status to penicillin: Secondary | ICD-10-CM | POA: Diagnosis not present

## 2020-06-10 DIAGNOSIS — K5790 Diverticulosis of intestine, part unspecified, without perforation or abscess without bleeding: Secondary | ICD-10-CM | POA: Diagnosis not present

## 2020-06-10 DIAGNOSIS — M1990 Unspecified osteoarthritis, unspecified site: Secondary | ICD-10-CM | POA: Diagnosis not present

## 2020-06-10 DIAGNOSIS — R6889 Other general symptoms and signs: Secondary | ICD-10-CM | POA: Diagnosis not present

## 2020-06-10 DIAGNOSIS — Z91041 Radiographic dye allergy status: Secondary | ICD-10-CM | POA: Diagnosis not present

## 2020-06-10 DIAGNOSIS — Z79899 Other long term (current) drug therapy: Secondary | ICD-10-CM | POA: Diagnosis not present

## 2020-06-10 DIAGNOSIS — M10032 Idiopathic gout, left wrist: Secondary | ICD-10-CM | POA: Diagnosis not present

## 2020-06-10 DIAGNOSIS — Z743 Need for continuous supervision: Secondary | ICD-10-CM | POA: Diagnosis not present

## 2020-06-10 DIAGNOSIS — Z882 Allergy status to sulfonamides status: Secondary | ICD-10-CM | POA: Diagnosis not present

## 2020-06-11 ENCOUNTER — Ambulatory Visit (INDEPENDENT_AMBULATORY_CARE_PROVIDER_SITE_OTHER): Payer: Medicare Other

## 2020-06-11 ENCOUNTER — Telehealth: Payer: Self-pay | Admitting: Licensed Clinical Social Worker

## 2020-06-11 DIAGNOSIS — I428 Other cardiomyopathies: Secondary | ICD-10-CM

## 2020-06-11 NOTE — Telephone Encounter (Signed)
Phone encounter opened in error.   Westley Hummer, MSW, [place]  [phone]

## 2020-06-11 NOTE — Telephone Encounter (Signed)
[name] received a call from [employer]. Pt appt previously scheduled for 2/8 appears to have been rescheduled for 2/17. Transportation Services will adjust ride and call pt and confirm new ride.   Westley Hummer, MSW, [place]  [phone]

## 2020-06-12 ENCOUNTER — Ambulatory Visit: Payer: Medicare Other | Admitting: Cardiovascular Disease

## 2020-06-13 ENCOUNTER — Telehealth: Payer: Self-pay

## 2020-06-13 ENCOUNTER — Telehealth: Payer: Self-pay | Admitting: General Practice

## 2020-06-13 LAB — CUP PACEART REMOTE DEVICE CHECK
Battery Remaining Longevity: 2 mo
Battery Voltage: 2.77 V
Brady Statistic AP VP Percent: 0 %
Brady Statistic AP VS Percent: 0 %
Brady Statistic AS VP Percent: 95.95 %
Brady Statistic AS VS Percent: 4.05 %
Brady Statistic RA Percent Paced: 0 %
Brady Statistic RV Percent Paced: 98.78 %
Date Time Interrogation Session: 20220208192101
Implantable Lead Implant Date: 19971124
Implantable Lead Implant Date: 20070831
Implantable Lead Location: 753858
Implantable Lead Location: 753860
Implantable Lead Model: 4024
Implantable Lead Model: 4194
Implantable Pulse Generator Implant Date: 20120619
Lead Channel Impedance Value: 4047 Ohm
Lead Channel Impedance Value: 4047 Ohm
Lead Channel Impedance Value: 456 Ohm
Lead Channel Impedance Value: 475 Ohm
Lead Channel Impedance Value: 494 Ohm
Lead Channel Impedance Value: 513 Ohm
Lead Channel Impedance Value: 684 Ohm
Lead Channel Impedance Value: 722 Ohm
Lead Channel Impedance Value: 912 Ohm
Lead Channel Pacing Threshold Amplitude: 0.75 V
Lead Channel Pacing Threshold Amplitude: 1.5 V
Lead Channel Pacing Threshold Pulse Width: 0.4 ms
Lead Channel Pacing Threshold Pulse Width: 1 ms
Lead Channel Sensing Intrinsic Amplitude: 8.875 mV
Lead Channel Sensing Intrinsic Amplitude: 8.875 mV
Lead Channel Setting Pacing Amplitude: 2 V
Lead Channel Setting Pacing Amplitude: 2.5 V
Lead Channel Setting Pacing Pulse Width: 0.4 ms
Lead Channel Setting Pacing Pulse Width: 1 ms
Lead Channel Setting Sensing Sensitivity: 2.8 mV

## 2020-06-13 NOTE — Telephone Encounter (Signed)
Patient called and made aware of instructions.     [facility] [place], [place]  [place], [place] [address]  Phone: [phone] Fax: [phone]    Generator Change Procedure Instructions  You are scheduled for a Generator Change (battery change) on  06/19/19  with Dr. Sallyanne Kuster.  1. Please arrive at the [facility], Entrance "A"  at [facility] at  11:30 am on the day of your procedure. (The address is [address])  2. DIET: You may have a light, early breakfast the morning of your procedure. NOTHING TO EAT AFTER 8:00 AM.  3. LABS: Labs have been completed 06/10/20 in [place] will need to have the coronavirus test completed prior to your procedure. An appointment has been made at 2:25 pm on 06/15/20. This is a Drive Up Visit at [address], [place],  [address]. Please tell them that you are there for procedure testing. Stay in your car and someone will be with you shortly. Please make sure to have all other labs completed before this test because you will need to stay quarantined until your procedure.   4. MEDICATIONS: Hold the Eliquis on Sunday and Monday morning.   5.  Plan for an overnight stay.  Bring your insurance cards and a list of you medications.  6.  Wash your chest and neck with surgical scrub the evening before and the morning of your procedure.  Rinse well. Please review the surgical scrub instruction sheet given to you.   7. Your chest will need to be shaved prior to this procedure (if needed). We ask that you do this yourself at home 1 to 2 days before or if uncomfortable/unable to do yourself, then it will be performed by the hospital staff the day of.  * Special note:  Every effort is made to have your procedure done on time.  Occasionally there are emergencies that present themselves at the hospital that may cause delays.  Please be patient if a delay does occur.                                                                                                            * If you have any questions after you get home, please call Lattie Haw, RN at [phone].    [facility] - Preparing For Surgery  Before surgery, you can play an important role. Because skin is not sterile, your skin needs to be as free of germs as possible. You can reduce the number of germs on your skin by washing with CHG (chlorahexidine gluconate) Soap before surgery.  CHG is an antiseptic cleaner which kills germs and bonds with the skin to continue killing germs even after washing.   Please do not use if you have an allergy to CHG or antibacterial soaps.  If your skin becomes reddened/irritated stop using the CHG.   Do not shave (including legs and underarms) for at least 48 hours prior to first CHG shower.  It  is OK to shave your face.  Please follow these instructions carefully:  1.  Shower the night before surgery and the morning of surgery with CHG.  2.  If you choose to wash your hair, wash your hair first as usual with your normal shampoo.  3.  After you shampoo, rinse your hair and body thoroughly to remove the shampoo.  4.  Use CHG as you would any other liquid soap.  You can apply CHG directly to the skin and wash gently with a clean washcloth. 5.  Apply the CHG Soap to your body ONLY FROM THE NECK DOWN.  Do not use on open wounds or open sores.  Avoid contact with your eyes, ears, mouth and genitals (private parts).    6.  Wash thoroughly, paying special attention to the area where your surgery will be performed.  7.  Thoroughly rinse your body with warm water from the neck down.   8.  DO NOT shower/wash with your normal soap after using and rinsing off the CHG soap.  9.  Pat yourself dry with a clean towel.   10.  Wear clean pajamas.   11.  Place clean sheets on your bed the night of your first shower and do not sleep with pets.  Day of Surgery: Do not apply any deodorants/lotions.   Please wear clean clothes to the hospital/surgery center.

## 2020-06-13 NOTE — Telephone Encounter (Signed)
Patient transmission from 06/12/20 reviewed. Device reached ERI on 05/24/20. Patient reports increased SOB with activity. Aware that she has appointment with Kerin Ransom 06/21/20. Patient informed she will receive call for appointment with Dr Sallyanne Kuster to discuss and schedule gen change .

## 2020-06-13 NOTE — Telephone Encounter (Signed)
   Kimberly [name] DOB: 1927-01-13 MRN: [id number]   RIDER WAIVER AND RELEASE OF LIABILITY  For purposes of improving physical access to our facilities, [facility] is pleased to partner with third parties to provide [place] patients or other authorized individuals the option of convenient, on-demand ground transportation services (the [employer]") through use of the technology service that enables users to request on-demand ground transportation from independent third-party providers.  By opting to use and accept these [employer], I, the undersigned, hereby agree on behalf of myself, and on behalf of any minor child using the [employer] for whom I am the parent or legal guardian, as follows:  1. Government social research officer provided to me are provided by independent third-party transportation providers who are not [employer] or employees and who are unaffiliated with [employer]. 2. [facility] is neither a transportation carrier nor a common or public carrier. 3. [facility] has no control over the quality or safety of the transportation that occurs as a result of the [employer]. 4. [facility] cannot guarantee that any third-party transportation provider will complete any arranged transportation service. 5. [facility] makes no representation, warranty, or guarantee regarding the reliability, timeliness, quality, safety, suitability, or availability of any of the Transport Services or that they will be error free. 6. I fully understand that traveling by vehicle involves risks and dangers of serious bodily injury, including permanent disability, paralysis, and death. I agree, on behalf of myself and on behalf of any minor child using the Transport Services for whom I am the parent or legal guardian, that the entire risk arising out of my use of the [employer] remains solely with me, to the maximum extent permitted under applicable law. 7. The [employer] are provided "as is" and "as available." [facility] disclaims all representations and warranties, express, implied or statutory, not expressly set out in these terms, including the implied warranties of merchantability and fitness for a particular purpose. 8. I hereby waive and release [facility], its agents, employees, officers, directors, representatives, insurers, attorneys, assigns, successors, subsidiaries, and affiliates from any and all past, present, or future claims, demands, liabilities, actions, causes of action, or suits of any kind directly or indirectly arising from acceptance and use of the [employer]. 9. I further waive and release [facility] and its affiliates from all present and future liability and responsibility for any injury or death to persons or damages to property caused by or related to the use of the [employer]. 10. I have read this Waiver and Release of Liability, and I understand the terms used in it and their legal significance. This Waiver is freely and voluntarily given with the understanding that my right (as well as the right of any minor child for whom I am the parent or legal guardian using the [employer]) to legal recourse against [facility] in connection with the [employer] is knowingly surrendered in return for use of these services.   I attest that I read the consent document to Kimberly [name], gave Ms. Cryan the opportunity to ask questions and answered the questions asked (if any). I affirm that Kimberly [name] then provided consent for she's participation in this program.     Kimberly [name]

## 2020-06-13 NOTE — Telephone Encounter (Signed)
Patient called in wanting someone to go over her transmission that she sent 06/12/2020.

## 2020-06-13 NOTE — Telephone Encounter (Signed)
Thanks, [place]. I spoke to Mrs. Abdelaziz and she is agreeable to just be scheduled for the generator change on Monday 02/14 (otherwise, I won't be available to do it for another 2 weeks after that. Lattie Haw, can you please take care of the labs/COVID test? I also would want to have an [employer] pouch available for her. We can cancel the [facility] appt. Thank you!

## 2020-06-14 ENCOUNTER — Telehealth: Payer: Self-pay | Admitting: Cardiovascular Disease

## 2020-06-14 ENCOUNTER — Telehealth: Payer: Self-pay | Admitting: Emergency Medicine

## 2020-06-14 DIAGNOSIS — K529 Noninfective gastroenteritis and colitis, unspecified: Secondary | ICD-10-CM | POA: Diagnosis not present

## 2020-06-14 DIAGNOSIS — G4489 Other headache syndrome: Secondary | ICD-10-CM | POA: Diagnosis not present

## 2020-06-14 DIAGNOSIS — B349 Viral infection, unspecified: Secondary | ICD-10-CM | POA: Diagnosis not present

## 2020-06-14 DIAGNOSIS — Z79899 Other long term (current) drug therapy: Secondary | ICD-10-CM | POA: Diagnosis not present

## 2020-06-14 DIAGNOSIS — R059 Cough, unspecified: Secondary | ICD-10-CM | POA: Diagnosis not present

## 2020-06-14 DIAGNOSIS — Z91041 Radiographic dye allergy status: Secondary | ICD-10-CM | POA: Diagnosis not present

## 2020-06-14 DIAGNOSIS — I517 Cardiomegaly: Secondary | ICD-10-CM | POA: Diagnosis not present

## 2020-06-14 DIAGNOSIS — Z882 Allergy status to sulfonamides status: Secondary | ICD-10-CM | POA: Diagnosis not present

## 2020-06-14 DIAGNOSIS — Z880 Allergy status to penicillin: Secondary | ICD-10-CM | POA: Diagnosis not present

## 2020-06-14 DIAGNOSIS — Z209 Contact with and (suspected) exposure to unspecified communicable disease: Secondary | ICD-10-CM | POA: Diagnosis not present

## 2020-06-14 DIAGNOSIS — I10 Essential (primary) hypertension: Secondary | ICD-10-CM | POA: Diagnosis not present

## 2020-06-14 DIAGNOSIS — R197 Diarrhea, unspecified: Secondary | ICD-10-CM | POA: Diagnosis not present

## 2020-06-14 DIAGNOSIS — Z743 Need for continuous supervision: Secondary | ICD-10-CM | POA: Diagnosis not present

## 2020-06-14 DIAGNOSIS — Z20822 Contact with and (suspected) exposure to covid-19: Secondary | ICD-10-CM | POA: Diagnosis not present

## 2020-06-14 NOTE — Telephone Encounter (Signed)
Device has reached ERI. Patient has scheduled Gen change on 06/18/2020.

## 2020-06-14 NOTE — Telephone Encounter (Signed)
    Pt said she's having diarrhea and headache that wont go away, she spoke with her pcp and they said she might have covid. She wanted to go to the hospital today but she doesn't have transportation and. She also said she can't come in tomorrow for her covid test. She wanted to get a callback with Dr. Lurline Del nurse as soon as possible

## 2020-06-14 NOTE — Telephone Encounter (Signed)
[name] from [facility] calling to find out what time to bring the patient to her procedure 06/18/2020. Phone: [phone]

## 2020-06-14 NOTE — Telephone Encounter (Signed)
[name] notified that pt would need to arrive by 11:30. [name] verbalized understanding.

## 2020-06-14 NOTE — Telephone Encounter (Signed)
Returned the call to the patient. She stated that she has had bad diarrhea and a headache today. She feels like she has covid. She has called ems to take her to the hospital.  Her generator change is currently scheduled for 06/18/20.

## 2020-06-15 ENCOUNTER — Other Ambulatory Visit (HOSPITAL_COMMUNITY): Payer: Medicare HMO

## 2020-06-15 NOTE — Telephone Encounter (Signed)
OK w COVID test done in [place], in [facility]

## 2020-06-15 NOTE — Telephone Encounter (Signed)
Spoke with patient. She would like to proceed with procedure on Monday. Covid test is in care everywhere.

## 2020-06-15 NOTE — Telephone Encounter (Signed)
Patient would like to know if it is okay for her to take something for the diarrhea.

## 2020-06-15 NOTE — Progress Notes (Signed)
Attempted to call patient regarding procedure instructions for Monday's procedure.  Left voice mail on the following instructions: Nothing to eat or drink after midnight, will need responsible person to drive you home on Monday as well as stay overnight with you.  Hold Eliquis on Sunday.  No Eliquis on Monday morning.  Arrival time is 11:30

## 2020-06-15 NOTE — Telephone Encounter (Signed)
She can use Imodium for the diarrhea.  Her chart notes she takes lactulose for constipation.  She should hold off on a dose or two of this if she needs the imodium.

## 2020-06-15 NOTE — Telephone Encounter (Signed)
° ° °  Pt is calling back, she said she got her covid test at [facility] in [place]. She said she was told by the hospital we need to contact them to request a copy of her covid test.

## 2020-06-15 NOTE — Telephone Encounter (Signed)
Spoke with patient and relayed pharmacist recommendations, patient verbalized understanding.

## 2020-06-16 ENCOUNTER — Observation Stay (HOSPITAL_COMMUNITY)
Admission: EM | Admit: 2020-06-16 | Discharge: 2020-06-19 | Disposition: A | Discharge status: Home / Self Care | Payer: Medicare HMO | Attending: Internal Medicine | Admitting: Internal Medicine

## 2020-06-16 ENCOUNTER — Emergency Department (HOSPITAL_COMMUNITY): Payer: Medicare HMO

## 2020-06-16 DIAGNOSIS — R519 Headache, unspecified: Secondary | ICD-10-CM | POA: Insufficient documentation

## 2020-06-16 DIAGNOSIS — M6281 Muscle weakness (generalized): Secondary | ICD-10-CM | POA: Diagnosis not present

## 2020-06-16 DIAGNOSIS — R000 Tachycardia, unspecified: Secondary | ICD-10-CM | POA: Diagnosis not present

## 2020-06-16 DIAGNOSIS — R0689 Other abnormalities of breathing: Secondary | ICD-10-CM | POA: Diagnosis not present

## 2020-06-16 DIAGNOSIS — R002 Palpitations: Secondary | ICD-10-CM | POA: Diagnosis not present

## 2020-06-16 DIAGNOSIS — I5043 Acute on chronic combined systolic (congestive) and diastolic (congestive) heart failure: Secondary | ICD-10-CM | POA: Insufficient documentation

## 2020-06-16 DIAGNOSIS — Z20822 Contact with and (suspected) exposure to covid-19: Secondary | ICD-10-CM | POA: Diagnosis not present

## 2020-06-16 DIAGNOSIS — Z950 Presence of cardiac pacemaker: Secondary | ICD-10-CM | POA: Insufficient documentation

## 2020-06-16 DIAGNOSIS — I5032 Chronic diastolic (congestive) heart failure: Secondary | ICD-10-CM | POA: Diagnosis present

## 2020-06-16 DIAGNOSIS — I4821 Permanent atrial fibrillation: Secondary | ICD-10-CM | POA: Insufficient documentation

## 2020-06-16 DIAGNOSIS — R0600 Dyspnea, unspecified: Secondary | ICD-10-CM | POA: Diagnosis not present

## 2020-06-16 DIAGNOSIS — T82111A Breakdown (mechanical) of cardiac pulse generator (battery), initial encounter: Secondary | ICD-10-CM | POA: Insufficient documentation

## 2020-06-16 DIAGNOSIS — Z79899 Other long term (current) drug therapy: Secondary | ICD-10-CM | POA: Diagnosis not present

## 2020-06-16 DIAGNOSIS — R079 Chest pain, unspecified: Secondary | ICD-10-CM | POA: Diagnosis present

## 2020-06-16 DIAGNOSIS — D649 Anemia, unspecified: Secondary | ICD-10-CM | POA: Diagnosis not present

## 2020-06-16 DIAGNOSIS — I2510 Atherosclerotic heart disease of native coronary artery without angina pectoris: Secondary | ICD-10-CM | POA: Diagnosis not present

## 2020-06-16 DIAGNOSIS — K2100 Gastro-esophageal reflux disease with esophagitis, without bleeding: Secondary | ICD-10-CM | POA: Diagnosis not present

## 2020-06-16 DIAGNOSIS — F319 Bipolar disorder, unspecified: Secondary | ICD-10-CM | POA: Diagnosis not present

## 2020-06-16 DIAGNOSIS — R197 Diarrhea, unspecified: Secondary | ICD-10-CM | POA: Diagnosis not present

## 2020-06-16 DIAGNOSIS — I442 Atrioventricular block, complete: Secondary | ICD-10-CM

## 2020-06-16 DIAGNOSIS — Z7901 Long term (current) use of anticoagulants: Secondary | ICD-10-CM | POA: Insufficient documentation

## 2020-06-16 DIAGNOSIS — E876 Hypokalemia: Secondary | ICD-10-CM | POA: Insufficient documentation

## 2020-06-16 DIAGNOSIS — I10 Essential (primary) hypertension: Secondary | ICD-10-CM | POA: Diagnosis present

## 2020-06-16 DIAGNOSIS — I130 Hypertensive heart and chronic kidney disease with heart failure and stage 1 through stage 4 chronic kidney disease, or unspecified chronic kidney disease: Secondary | ICD-10-CM | POA: Insufficient documentation

## 2020-06-16 DIAGNOSIS — Z45010 Encounter for checking and testing of cardiac pacemaker pulse generator [battery]: Secondary | ICD-10-CM

## 2020-06-16 DIAGNOSIS — Z87891 Personal history of nicotine dependence: Secondary | ICD-10-CM | POA: Insufficient documentation

## 2020-06-16 DIAGNOSIS — R0789 Other chest pain: Secondary | ICD-10-CM | POA: Diagnosis not present

## 2020-06-16 DIAGNOSIS — I517 Cardiomegaly: Secondary | ICD-10-CM | POA: Diagnosis not present

## 2020-06-16 DIAGNOSIS — N1832 Chronic kidney disease, stage 3b: Secondary | ICD-10-CM | POA: Insufficient documentation

## 2020-06-16 DIAGNOSIS — Y829 Unspecified medical devices associated with adverse incidents: Secondary | ICD-10-CM | POA: Insufficient documentation

## 2020-06-16 DIAGNOSIS — N183 Chronic kidney disease, stage 3 unspecified: Secondary | ICD-10-CM | POA: Diagnosis present

## 2020-06-16 DIAGNOSIS — R0902 Hypoxemia: Secondary | ICD-10-CM | POA: Diagnosis not present

## 2020-06-16 DIAGNOSIS — R0602 Shortness of breath: Secondary | ICD-10-CM | POA: Diagnosis not present

## 2020-06-16 LAB — CBC WITH DIFFERENTIAL/PLATELET
Abs Immature Granulocytes: 0.13 10*3/uL — ABNORMAL HIGH (ref 0.00–0.07)
Basophils Absolute: 0 10*3/uL (ref 0.0–0.1)
Basophils Relative: 0 %
Eosinophils Absolute: 0.1 10*3/uL (ref 0.0–0.5)
Eosinophils Relative: 1 %
HCT: 33.1 % — ABNORMAL LOW (ref 36.0–46.0)
Hemoglobin: 11 g/dL — ABNORMAL LOW (ref 12.0–15.0)
Immature Granulocytes: 1 %
Lymphocytes Relative: 13 %
Lymphs Abs: 1.3 10*3/uL (ref 0.7–4.0)
MCH: 29.6 pg (ref 26.0–34.0)
MCHC: 33.2 g/dL (ref 30.0–36.0)
MCV: 89.2 fL (ref 80.0–100.0)
Monocytes Absolute: 0.9 10*3/uL (ref 0.1–1.0)
Monocytes Relative: 9 %
Neutro Abs: 7.3 10*3/uL (ref 1.7–7.7)
Neutrophils Relative %: 76 %
Platelets: 203 10*3/uL (ref 150–400)
RBC: 3.71 MIL/uL — ABNORMAL LOW (ref 3.87–5.11)
RDW: 18.5 % — ABNORMAL HIGH (ref 11.5–15.5)
WBC: 9.7 10*3/uL (ref 4.0–10.5)
nRBC: 0 % (ref 0.0–0.2)

## 2020-06-16 LAB — BASIC METABOLIC PANEL
Anion gap: 13 (ref 5–15)
BUN: 28 mg/dL — ABNORMAL HIGH (ref 8–23)
CO2: 21 mmol/L — ABNORMAL LOW (ref 22–32)
Calcium: 8.5 mg/dL — ABNORMAL LOW (ref 8.9–10.3)
Chloride: 101 mmol/L (ref 98–111)
Creatinine, Ser: 1.42 mg/dL — ABNORMAL HIGH (ref 0.44–1.00)
GFR, Estimated: 35 mL/min — ABNORMAL LOW (ref >60)
Glucose, Bld: 90 mg/dL (ref 70–99)
Potassium: 3.2 mmol/L — ABNORMAL LOW (ref 3.5–5.1)
Sodium: 135 mmol/L (ref 135–145)

## 2020-06-16 LAB — TROPONIN I (HIGH SENSITIVITY): Troponin I (High Sensitivity): 21 ng/L — ABNORMAL HIGH (ref <18)

## 2020-06-16 LAB — BRAIN NATRIURETIC PEPTIDE: B Natriuretic Peptide: 486.3 pg/mL — ABNORMAL HIGH (ref 0.0–100.0)

## 2020-06-16 LAB — MAGNESIUM: Magnesium: 1.7 mg/dL (ref 1.7–2.4)

## 2020-06-16 MED ORDER — CARVEDILOL 3.125 MG PO TABS
6.2500 mg | ORAL_TABLET | Freq: Once | ORAL | Status: AC
  Administered 2020-06-17: 6.25 mg via ORAL
  Filled 2020-06-16: qty 2

## 2020-06-16 MED ORDER — ACETAMINOPHEN 325 MG PO TABS
650.0000 mg | ORAL_TABLET | Freq: Once | ORAL | Status: AC
  Administered 2020-06-16: 650 mg via ORAL
  Filled 2020-06-16: qty 2

## 2020-06-16 NOTE — ED Triage Notes (Signed)
Pt came in via ems with complaints of her pacemaker not working. Pt reports having this feeling for months,pt states her cardiologist is aware. Pt was supposed to have battery replaced Monday. Pt also reports a headache and feeling of  sob. EMS reports pts vitals have been stable. Pt denies chest pain, nausea, vomiting. Pt reports new onset of diarrhea yesterday.

## 2020-06-16 NOTE — Telephone Encounter (Signed)
Paged by operator that Kimberly [name] was having worsening SOB. Called her and she was not in distress but said she had worsening SOB over the past 7-8 hours and was not improving. Both at rest and exertion, can't walk more than a few feet now. Does not think it is fluid as she denies any new LE edema or wt change, has been taking lasix as prescribed. She called yesterday for diarrhea. Had viral panel at [facility] on 06/14/20 that was negative including covid PCR. She felt that she needed to be evaluated so recommended she call EMS. Will notify Dr. Victorino December staff as she likely needs to have gen change schedule for Monday delayed with new onset sx and worsening SOB. She understands current plan and will call EMS. Instructed her to page back if issues receiving evaluation.

## 2020-06-16 NOTE — ED Provider Notes (Signed)
[facility] EMERGENCY DEPARTMENT Provider Note   CSN: [id number] Arrival date & time: 06/16/20  2055     History Chief Complaint  Patient presents with  . Pacemaker Problem    Kimberly [name] is a 87 y.o. female with a history of Afib on Eliquis s/p AV node ablation and biventricular pacemaker (Medtronic Consulta CRT-P), CKD, and CHF (LVEF 55-60%) who presents to the ED from home via EMS for palpitations. Patient states she has been feeling her pacemaker continually firing for the last several days. One episode of pounding chest pain earlier today that has since resolved. She also reports SOB and DOE, but states this has been an ongoing problem for a long time. Denies cough, N/V, abdominal pain, lightheadedness, dizziness, weakness, numbness, or recent falls. Patient scheduled to have pacemaker battery replaced in 2 days. Lives alone at home and uses walker to ambulate.  The history is provided by the patient and medical records.  Palpitations Onset quality:  Gradual Duration: several days. Timing:  Constant Progression:  Unchanged Chronicity:  New Context comment:  Pacemaker firing Relieved by:  Nothing Worsened by:  Nothing Ineffective treatments:  None tried Associated symptoms: shortness of breath   Associated symptoms: no back pain, no chest pain, no cough, no diaphoresis, no dizziness, no malaise/fatigue, no nausea, no near-syncope, no syncope and no vomiting   Risk factors: heart disease and hx of atrial fibrillation        Past Medical History:  Diagnosis Date  . Acute blood loss anemia 10/11/2012  . Acute diverticulitis 08/24/2013  . Acute on chronic combined systolic and diastolic CHF, NYHA class 4 ([place]) 11/15/2013  . Antral ulcer 10/11/2012  . Arrhythmia    atrial fibb  . Atrial fibrillation ([place])   . Bipolar affective disorder ([place]) 03/23/2017  . Cardiomyopathy, nonischemic ([place])   . Chronic anticoagulation 10/12/2012  . CKD (chronic kidney disease)  stage 3, GFR 30-59 ml/min (HCC) 10/12/2012  . Depression   . Erosive esophagitis 10/11/2012  . Fibromyalgia   . Glaucoma   . H/O echocardiogram 2007   EF 40-45%,         . Hypertension   . Osteoarthritis   . Pacemaker    Last saw cards 07/2013  . Scoliosis     Patient Active Problem List   Diagnosis Date Noted  . Right sided abdominal pain 01/24/2020  . Finger dislocation, initial encounter 11/16/2018  . Hand trauma, left, initial encounter 11/16/2018  . Acute blood loss anemia 10/25/2018  . Rectal bleeding 10/23/2018  . Acute GI bleeding   . Enteritis of small intestine due to enterotoxigenic Escherichia coli associated with diarrhea 03/24/2017  . Bipolar affective disorder ([place]) 03/23/2017  . Olfactory hallucinations 03/23/2017  . Lower GI bleed   . Glaucoma 02/28/2017  . Pacemaker 03/21/2015  . Non-ischemic cardiomyopathy ([place]) 11/28/2014  . PUD (peptic ulcer disease) 06/14/2014  . History of bipolar disorder   . Hypokalemia 05/26/2014  . Fall at home 05/26/2014  . Chronic diastolic heart failure ([place]) 11/15/2013  . Complete heart block ([place]) 11/15/2013  . Inability to ambulate due to ankle or foot 09/05/2013  . Unspecified constipation 08/26/2013  . Anemia 08/25/2013  . Acute diverticulitis 08/24/2013  . CKD (chronic kidney disease) stage 3, GFR 30-59 ml/min (HCC) 10/12/2012  . Chronic anticoagulation 10/12/2012  . Osteoarthritis   . Permanent atrial fibrillation   . Biventricular cardiac pacemaker - Medtronic Consulta   . Fibromyalgia   . Essential hypertension   .  Depression     Past Surgical History:  Procedure Laterality Date  . ABDOMINAL HYSTERECTOMY    . APPENDECTOMY    . BACK SURGERY    . BIOPSY  03/03/2017   Procedure: BIOPSY;  Surgeon: Danie Binder, MD;  Location: [facility];  Service: Endoscopy;;  gastric  . BREAST SURGERY    . CARDIAC CATHETERIZATION  12/08/2005   LAD AND LEFT MAIN WITH NO HIGH-GRADE STENOSIS. MILD DISEASE IN THE CX AND LAD  SYSTEM. SEVERE LV DYSFUNCTION WITH DILATION OF THE LV. EF 15-20%. LV END-DIASTOLIC PRESSURE IS 90. +1 MR.  . CHOLECYSTECTOMY    . COLONOSCOPY N/A 03/03/2017   Dr. Oneida Alar: 5 colon polyps removed, adenomatous.  Diverticulosis.  99% of the colon was cleared but the cecum was not adequately seen.  . CYSTOSCOPY N/A 02/24/2013   Procedure: CYSTOSCOPY WITH URETHRAL DILITATION;  Surgeon: Marissa Nestle, MD;  Location: [facility] ORS;  Service: Urology;  Laterality: N/A;  . DOPPLER ECHOCARDIOGRAPHY N/A 05/30/2010   LV SIZE IS NORMAL. LV SYSTOLIC FUNCTION IS LOW NORMAL. EF=50-55%. MILD INFERIOR HYPOKINESIS.MILD TO MODERATE POSTERIOR WALL HYPOKINESIS.PACEMAKER LEAD IN THE RV. LA IS MILDLY DILATED. RA IS MODERATE TO SEVERLY DILATED. PACEMAKER LEAD IN THE RA. MILD CALCICICATION OF THE MV APPARATUS. MODERATE MR. MILD TO MODERATE TR. MILD PHTN.AV MILDLY SCLEROTIC.  [name] ESOPHAGOGASTRODUODENOSCOPY N/A 10/13/2012   Dr. Gala Romney: severe ulcerative reflux esophagitis, question of Barrett's but negative path, single deep prepyloric antral ulcer, negative H.pylori  . ESOPHAGOGASTRODUODENOSCOPY N/A 03/03/2017   Dr. Oneida Alar: Gastritis/duodenitis, no H. pylori  . HERNIA REPAIR     right inguinal hernia and umbilical  . LOWETR EXT VENOUS Bilateral 11-08-10   R & L- NO EVIDENCE OF THROMBUS OR THROMBOPHLEBITIS. THERE IS MILD AMOUNT OF SUBCUTANEOUS EDEMA NOTED WITHIN THE LEFT CALF AND ANKLE. R & L GSV AND SSV- NO VENOUS INSUFF NOTED.  [name] NECK SURGERY    . NUCLEAR STRESS TEST N/A 02/13/2009   NORMAL PATTERN OF PERFUSION IN ALL REGIONS. POST STRESS VENTICULAR SIZE IS NORMAL. POST  STESS EF 85%.  NORMAL MYOCARDIAL PERFUSION STUDY.  [name] OPEN REDUCTION INTERNAL FIXATION (ORIF) DISTAL PHALANX Left 11/16/2018   Procedure: MIDDLE FINGER OPEN REDUCTION VERSUS RECONSTRUCTION;  Surgeon: Roseanne Kaufman, MD;  Location: [place];  Service: Orthopedics;  Laterality: Left;  . PACEMAKER INSERTION    . POLYPECTOMY  03/03/2017   Procedure: POLYPECTOMY;   Surgeon: Danie Binder, MD;  Location[facility];  Service: Endoscopy;;  colon  . TONSILLECTOMY    . YAG LASER APPLICATION Bilateral 4/62/7035   Procedure: YAG LASER APPLICATION;  Surgeon: Williams Che, MD;  Location: [facility] OR[facility];  Service: Ophthalmology;  Laterality: Bilateral;     OB History    Gravida  5   Para  2   Term  1   Preterm  1   AB  3   Living  2     SAB  3   IAB      Ectopic      Multiple      Live Births              Family History  Problem Relation Age of Onset  . Cancer Sister   . Asthma Sister   . Heart failure Brother   . Pulmonary embolism Brother   . Cancer Brother   . Colon cancer Neg Hx     Social History   Tobacco Use  . Smoking status: Former Smoker    Packs/day:  0.50    Years: 30.00    Pack years: 15.00    Types: Cigarettes    Quit date: 12/13/2004    Years since quitting: 15.5  . Smokeless tobacco: Never Used  . Tobacco comment: former smoker  Vaping Use  . Vaping Use: Never used  Substance Use Topics  . Alcohol use: Yes    Alcohol/week: 0.0 standard drinks    Comment: history of drinking a 1/2 glass of wine in the evening  . Drug use: No    Home Medications Prior to Admission medications   Medication Sig Start Date End Date Taking? Authorizing Provider  acetaminophen (TYLENOL) 325 MG tablet Take 2 tablets (650 mg total) by mouth every 6 (six) hours as needed for mild pain, fever or headache (or Fever >/= 101). 10/17/18   Roxan Hockey, MD  apixaban (ELIQUIS) 2.5 MG TABS tablet Take 1 tablet (2.5 mg total) by mouth 2 (two) times daily. 02/03/20   Croitoru, Mihai, MD  calcium carbonate (TUMS - DOSED IN MG ELEMENTAL CALCIUM) 500 MG chewable tablet Chew 500 mg by mouth daily as needed for heartburn or indigestion.    [provider]  carvedilol (COREG) 6.25 MG tablet TAKE 1 TABLET BY MOUTH  TWICE DAILY WITH A MEAL Patient taking differently: Take 6.25 mg by mouth 2 (two) times daily with a meal. 01/26/20    Croitoru, Mihai, MD  colchicine 0.6 MG tablet Take 0.6-1.2 mg by mouth See admin instructions. Take 1.2 mg at one time ,then take 0.6 mg one hour later as needed 06/13/20   [provider]  famotidine (PEPCID) 20 MG tablet Take 20 mg by mouth 2 (two) times daily. 06/08/20   [provider]  furosemide (LASIX) 20 MG tablet Take 1 tablet (20 mg total) by mouth daily. 02/06/20   Croitoru, Mihai, MD  lactulose (CHRONULAC) 10 GM/15ML solution Take 15 mLs by mouth daily as needed for constipation. 06/08/20   [provider]  Multiple Vitamins-Minerals (PRESERVISION AREDS 2) CAPS Take 1 capsule by mouth 2 (two) times daily.     [provider]  pantoprazole (PROTONIX) 40 MG tablet Take 40 mg by mouth daily as needed for heartburn. 05/22/20   [provider]  Polyethylene Glycol 400 (BLINK TEARS) 0.25 % SOLN Place 1 drop into both eyes daily as needed (for dry eye relief).    [provider]  predniSONE (DELTASONE) 20 MG tablet Take 20 mg by mouth 2 (two) times daily with a meal. Taper 06/11/20   [provider]  sertraline (ZOLOFT) 25 MG tablet Take 25 mg by mouth daily. 06/13/20   [provider]  traMADol (ULTRAM) 50 MG tablet Take 1 tablet (50 mg total) by mouth every 6 (six) hours as needed. 11/16/19   Noemi Chapel, MD  Vitamin D, Ergocalciferol, (DRISDOL) 1.25 MG (50000 UNIT) CAPS capsule Take 50,000 Units by mouth once a week. 01/14/20   [provider]    Allergies    Ciprofloxacin, Flagyl [metronidazole], Papaya derivatives, Iodine, Penicillins, and Sulfa antibiotics  Review of Systems   Review of Systems  Constitutional: Negative for chills, diaphoresis, fever and malaise/fatigue.  HENT: Negative for ear pain and sore throat.   Eyes: Negative for pain and visual disturbance.  Respiratory: Positive for shortness of breath. Negative for cough.   Cardiovascular: Positive for palpitations. Negative for chest pain, syncope and  near-syncope.  Gastrointestinal: Positive for diarrhea. Negative for abdominal pain, nausea and vomiting.  Genitourinary: Negative for dysuria and hematuria.  Musculoskeletal: Negative for arthralgias and back pain.  Skin: Negative for color change and rash.  Neurological: Positive for headaches. Negative for dizziness, seizures and syncope.  All other systems reviewed and are negative.   Physical Exam Updated Vital Signs BP (!) 165/83   Pulse 80   Temp 97.8 F (36.6 C) (Oral)   Resp 20   Ht 5\' 7"  (1.702 m)   Wt 65.8 kg   SpO2 99%   BMI 22.71 kg/m   Physical Exam Vitals and nursing note reviewed.  Constitutional:      General: She is awake. She is not in acute distress.    Appearance: Normal appearance. She is well-developed, normal weight and well-nourished. She is not ill-appearing or diaphoretic.  HENT:     Head: Normocephalic and atraumatic.     Right Ear: External ear normal.     Left Ear: External ear normal.     Nose: Nose normal. No congestion.  Eyes:     General: No scleral icterus.       Right eye: No discharge.        Left eye: No discharge.     Conjunctiva/sclera: Conjunctivae normal.  Cardiovascular:     Rate and Rhythm: Normal rate and regular rhythm.     Pulses: Normal pulses.     Heart sounds: Normal heart sounds. No murmur heard.   Pulmonary:     Effort: Pulmonary effort is normal. No respiratory distress.     Breath sounds: Normal breath sounds. No wheezing, rhonchi or rales.  Chest:     Chest wall: No tenderness.     Comments: Pacemaker palpable over L upper chest without tenderness or overlying skin changes. Abdominal:     General: Abdomen is flat. There is no distension.     Palpations: Abdomen is soft.     Tenderness: There is no abdominal tenderness. There is no guarding or rebound.  Musculoskeletal:        General: No edema.     Cervical back: Neck supple.  Skin:    General: Skin is warm and dry.     Findings: No rash.  Neurological:      General: No focal deficit present.     Mental Status: She is alert and oriented to person, place, and time.     GCS: GCS eye subscore is 4. GCS verbal subscore is 5. GCS motor subscore is 6.     Sensory: Sensation is intact. No sensory deficit.     Motor: Motor function is intact. No weakness.  Psychiatric:        Mood and Affect: Mood and affect normal.        Behavior: Behavior is cooperative.     ED Results / Procedures / Treatments   Labs (all labs ordered are listed, but only abnormal results are displayed) Labs Reviewed  CBC WITH DIFFERENTIAL/PLATELET - Abnormal; Notable for the following components:      Result Value   RBC 3.71 (*)    Hemoglobin 11.0 (*)    HCT 33.1 (*)    RDW 18.5 (*)    Abs Immature Granulocytes 0.13 (*)    All other components within normal limits  BASIC METABOLIC PANEL - Abnormal; Notable for the following components:   Potassium 3.2 (*)    CO2 21 (*)    BUN 28 (*)    Creatinine, Ser 1.42 (*)    Calcium 8.5 (*)    GFR, Estimated 35 (*)    All other components  within normal limits  BRAIN NATRIURETIC PEPTIDE - Abnormal; Notable for the following components:   B Natriuretic Peptide 486.3 (*)    All other components within normal limits  TROPONIN I (HIGH SENSITIVITY) - Abnormal; Notable for the following components:   Troponin I (High Sensitivity) 21 (*)    All other components within normal limits  MAGNESIUM  TROPONIN I (HIGH SENSITIVITY)    EKG EKG Interpretation  Date/Time:  Saturday June 16 2020 21:03:48 EST Ventricular Rate:  73 PR Interval:    QRS Duration: 155 QT Interval:  457 QTC Calculation: 504 R Axis:   -155 Text Interpretation: Electronic ventricular pacemaker No significant change since last tracing Confirmed by Dorie Rank [phone]) on 06/16/2020 9:04:52 PM   Radiology DG Chest Portable 1 View  Result Date: 06/16/2020 CLINICAL DATA:  Pacemaker problem. Patient reports the pacemaker is not working. EXAM: PORTABLE  CHEST 1 VIEW COMPARISON:  06/14/2020 FINDINGS: Left anterior chest wall biventricular cardioverter-defibrillator is stable in appearance, leads unchanged, from the prior exam. Cardiac silhouette is mildly enlarged. Diffuse aortic atherosclerotic calcifications. No mediastinal or hilar masses. Lungs are hyperexpanded. Left lung base not well visualized due to the superimposed heart and the semi-erect AP technique. Stables scarring at the apices including pleural base calcification. Lungs otherwise clear. No convincing pleural effusion and no pneumothorax. Skeletal structures are demineralized but grossly intact. IMPRESSION: 1. No acute cardiopulmonary disease. 2. No change in the left anterior chest wall biventricular cardioverter-defibrillator. 3. Mild cardiomegaly and aortic atherosclerosis. Electronically Signed   By: Lajean Manes M.D.   On: 06/16/2020 22:48    Procedures Procedures   Medications Ordered in ED Medications  carvedilol (COREG) tablet 6.25 mg (has no administration in time range)  acetaminophen (TYLENOL) tablet 650 mg (650 mg Oral Given 06/16/20 2254)    ED Course  I have reviewed the triage vital signs and the nursing notes.  Pertinent labs & imaging results that were available during my care of the patient were reviewed by me and considered in my medical decision making (see chart for details).    MDM Rules/Calculators/A&P                          Patient is a 61yoF with a history and physical as described above who presents with palpitations and pacemaker firing. VS reassuring and HDS. Patient resting comfortably and in no acute distress. Initial ECG demonstrates ventricularly paced rhythm with HR 73 and no acute ischemic changes. Initial workup includes CBC, BMP, Mg, troponin, BNP, and CXR.  On reassessment, patient resting comfortably. Labs notable for initial troponin 21, K 3.2, BNP 486, and Cr 1.42 (1.64 on 02/03/20). K repleted. Renal function at baseline. Troponin  slightly elevated from usual baseline. BNP elevated but not as high as previously. Low suspicion for CHF exacerbation given no JVD, no crackles, no pitting edema, and no pulmonary edema on CXR. Given reported pounding chest pain earlier today as well slightly elevated troponin, patient warrants admission for delta trops and observation overnight. Discussed patient with hospitalist who will admit. No further acute events under my care. Patient in stable condition at time of admission.  Final Clinical Impression(s) / ED Diagnoses Final diagnoses:  Chest pain, unspecified type  Palpitations    Rx / DC Orders ED Discharge Orders    None       Christy Gentles, MD 06/16/20 2344    Dorie Rank, MD 06/17/20 1515

## 2020-06-17 ENCOUNTER — Encounter (HOSPITAL_COMMUNITY): Payer: Self-pay | Admitting: Family Medicine

## 2020-06-17 ENCOUNTER — Other Ambulatory Visit: Payer: Self-pay | Admitting: *Deleted

## 2020-06-17 DIAGNOSIS — R079 Chest pain, unspecified: Secondary | ICD-10-CM | POA: Diagnosis not present

## 2020-06-17 DIAGNOSIS — I442 Atrioventricular block, complete: Secondary | ICD-10-CM

## 2020-06-17 DIAGNOSIS — R0600 Dyspnea, unspecified: Secondary | ICD-10-CM | POA: Diagnosis not present

## 2020-06-17 DIAGNOSIS — Z950 Presence of cardiac pacemaker: Secondary | ICD-10-CM

## 2020-06-17 DIAGNOSIS — I5032 Chronic diastolic (congestive) heart failure: Secondary | ICD-10-CM

## 2020-06-17 DIAGNOSIS — I5033 Acute on chronic diastolic (congestive) heart failure: Secondary | ICD-10-CM | POA: Diagnosis not present

## 2020-06-17 DIAGNOSIS — E876 Hypokalemia: Secondary | ICD-10-CM

## 2020-06-17 DIAGNOSIS — I4821 Permanent atrial fibrillation: Secondary | ICD-10-CM | POA: Diagnosis not present

## 2020-06-17 DIAGNOSIS — N1832 Chronic kidney disease, stage 3b: Secondary | ICD-10-CM

## 2020-06-17 DIAGNOSIS — Z7901 Long term (current) use of anticoagulants: Secondary | ICD-10-CM

## 2020-06-17 DIAGNOSIS — I10 Essential (primary) hypertension: Secondary | ICD-10-CM

## 2020-06-17 LAB — TROPONIN I (HIGH SENSITIVITY)
Troponin I (High Sensitivity): 20 ng/L — ABNORMAL HIGH (ref <18)
Troponin I (High Sensitivity): 17 ng/L (ref <18)
Troponin I (High Sensitivity): 16 ng/L (ref <18)

## 2020-06-17 LAB — COMPREHENSIVE METABOLIC PANEL
ALT: 15 U/L (ref 0–44)
AST: 20 U/L (ref 15–41)
Albumin: 3.1 g/dL — ABNORMAL LOW (ref 3.5–5.0)
Alkaline Phosphatase: 47 U/L (ref 38–126)
Anion gap: 11 (ref 5–15)
BUN: 27 mg/dL — ABNORMAL HIGH (ref 8–23)
CO2: 23 mmol/L (ref 22–32)
Calcium: 8.5 mg/dL — ABNORMAL LOW (ref 8.9–10.3)
Chloride: 103 mmol/L (ref 98–111)
Creatinine, Ser: 1.4 mg/dL — ABNORMAL HIGH (ref 0.44–1.00)
GFR, Estimated: 36 mL/min — ABNORMAL LOW (ref >60)
Glucose, Bld: 85 mg/dL (ref 70–99)
Potassium: 3.7 mmol/L (ref 3.5–5.1)
Sodium: 137 mmol/L (ref 135–145)
Total Bilirubin: 0.7 mg/dL (ref 0.3–1.2)
Total Protein: 6 g/dL — ABNORMAL LOW (ref 6.5–8.1)

## 2020-06-17 LAB — CBC WITH DIFFERENTIAL/PLATELET
Abs Immature Granulocytes: 0.07 10*3/uL (ref 0.00–0.07)
Basophils Absolute: 0 10*3/uL (ref 0.0–0.1)
Basophils Relative: 0 %
Eosinophils Absolute: 0.2 10*3/uL (ref 0.0–0.5)
Eosinophils Relative: 2 %
HCT: 33.9 % — ABNORMAL LOW (ref 36.0–46.0)
Hemoglobin: 10.9 g/dL — ABNORMAL LOW (ref 12.0–15.0)
Immature Granulocytes: 1 %
Lymphocytes Relative: 10 %
Lymphs Abs: 0.8 10*3/uL (ref 0.7–4.0)
MCH: 28.9 pg (ref 26.0–34.0)
MCHC: 32.2 g/dL (ref 30.0–36.0)
MCV: 89.9 fL (ref 80.0–100.0)
Monocytes Absolute: 0.6 10*3/uL (ref 0.1–1.0)
Monocytes Relative: 7 %
Neutro Abs: 6.6 10*3/uL (ref 1.7–7.7)
Neutrophils Relative %: 80 %
Platelets: 191 10*3/uL (ref 150–400)
RBC: 3.77 MIL/uL — ABNORMAL LOW (ref 3.87–5.11)
RDW: 18.3 % — ABNORMAL HIGH (ref 11.5–15.5)
WBC: 8.2 10*3/uL (ref 4.0–10.5)
nRBC: 0 % (ref 0.0–0.2)

## 2020-06-17 LAB — RESP PANEL BY RT-PCR (FLU A&B, COVID) ARPGX2
Influenza A by PCR: NEGATIVE
Influenza B by PCR: NEGATIVE
SARS Coronavirus 2 by RT PCR: NEGATIVE

## 2020-06-17 LAB — PHOSPHORUS: Phosphorus: 3.3 mg/dL (ref 2.5–4.6)

## 2020-06-17 LAB — MAGNESIUM: Magnesium: 1.7 mg/dL (ref 1.7–2.4)

## 2020-06-17 MED ORDER — ALUM & MAG HYDROXIDE-SIMETH 200-200-20 MG/5ML PO SUSP
15.0000 mL | Freq: Four times a day (QID) | ORAL | Status: DC | PRN
Start: 2020-06-17 — End: 2020-06-19
  Filled 2020-06-17: qty 30

## 2020-06-17 MED ORDER — VANCOMYCIN HCL 1000 MG/200ML IV SOLN
1000.0000 mg | INTRAVENOUS | Status: AC
  Administered 2020-06-18: 1000 mg via INTRAVENOUS
  Filled 2020-06-17 (×2): qty 200

## 2020-06-17 MED ORDER — MELATONIN 5 MG PO TABS
5.0000 mg | ORAL_TABLET | Freq: Every evening | ORAL | Status: DC | PRN
  Administered 2020-06-17: 5 mg via ORAL
  Filled 2020-06-17: qty 1

## 2020-06-17 MED ORDER — LOPERAMIDE HCL 2 MG PO CAPS
2.0000 mg | ORAL_CAPSULE | ORAL | Status: DC | PRN
  Administered 2020-06-17: 17:00:00 2 mg via ORAL
  Filled 2020-06-17: qty 1

## 2020-06-17 MED ORDER — FUROSEMIDE 20 MG PO TABS
20.0000 mg | ORAL_TABLET | Freq: Every day | ORAL | Status: DC
  Administered 2020-06-17 – 2020-06-19 (×3): 20 mg via ORAL
  Filled 2020-06-17 (×3): qty 1

## 2020-06-17 MED ORDER — SERTRALINE HCL 50 MG PO TABS
25.0000 mg | ORAL_TABLET | Freq: Every day | ORAL | Status: DC
  Administered 2020-06-17 – 2020-06-19 (×3): 25 mg via ORAL
  Filled 2020-06-17 (×3): qty 1

## 2020-06-17 MED ORDER — ACETAMINOPHEN 325 MG PO TABS
650.0000 mg | ORAL_TABLET | Freq: Four times a day (QID) | ORAL | Status: DC | PRN
  Administered 2020-06-17 – 2020-06-19 (×4): 650 mg via ORAL
  Filled 2020-06-17 (×4): qty 2

## 2020-06-17 MED ORDER — CARVEDILOL 6.25 MG PO TABS
6.2500 mg | ORAL_TABLET | Freq: Two times a day (BID) | ORAL | Status: DC
  Administered 2020-06-17 – 2020-06-19 (×5): 6.25 mg via ORAL
  Filled 2020-06-17 (×5): qty 1

## 2020-06-17 MED ORDER — TRAMADOL HCL 50 MG PO TABS
50.0000 mg | ORAL_TABLET | Freq: Once | ORAL | Status: AC
  Administered 2020-06-17: 50 mg via ORAL
  Filled 2020-06-17: qty 1

## 2020-06-17 MED ORDER — CHLORHEXIDINE GLUCONATE 4 % EX LIQD
4.0000 "application " | Freq: Once | CUTANEOUS | Status: AC
  Administered 2020-06-17: 4 via TOPICAL
  Filled 2020-06-17: qty 60

## 2020-06-17 MED ORDER — SODIUM CHLORIDE 0.9 % IV SOLN: INTRAVENOUS | Status: DC

## 2020-06-17 MED ORDER — SODIUM CHLORIDE 0.9 % IV SOLN
80.0000 mg | INTRAVENOUS | Status: AC
  Administered 2020-06-18: 80 mg
  Filled 2020-06-17 (×3): qty 2

## 2020-06-17 MED ORDER — ACETAMINOPHEN 325 MG PO TABS: 650.0000 mg | ORAL_TABLET | ORAL | Status: DC | PRN

## 2020-06-17 MED ORDER — FUROSEMIDE 10 MG/ML IJ SOLN
20.0000 mg | Freq: Once | INTRAMUSCULAR | Status: AC
  Administered 2020-06-17: 20 mg via INTRAVENOUS
  Filled 2020-06-17: qty 2

## 2020-06-17 MED ORDER — PANTOPRAZOLE SODIUM 40 MG PO TBEC
40.0000 mg | DELAYED_RELEASE_TABLET | Freq: Every day | ORAL | Status: DC
  Administered 2020-06-17 – 2020-06-19 (×3): 40 mg via ORAL
  Filled 2020-06-17 (×3): qty 1

## 2020-06-17 MED ORDER — POTASSIUM CHLORIDE CRYS ER 20 MEQ PO TBCR
40.0000 meq | EXTENDED_RELEASE_TABLET | Freq: Once | ORAL | Status: AC
  Administered 2020-06-17: 40 meq via ORAL
  Filled 2020-06-17: qty 2

## 2020-06-17 MED ORDER — APIXABAN 2.5 MG PO TABS
2.5000 mg | ORAL_TABLET | Freq: Two times a day (BID) | ORAL | Status: DC
  Administered 2020-06-17 (×2): 2.5 mg via ORAL
  Filled 2020-06-17 (×2): qty 1

## 2020-06-17 MED ORDER — ZOLPIDEM TARTRATE 5 MG PO TABS
5.0000 mg | ORAL_TABLET | Freq: Once | ORAL | Status: AC
  Administered 2020-06-17: 5 mg via ORAL
  Filled 2020-06-17: qty 1

## 2020-06-17 NOTE — H&P (View-Only) (Signed)
Cardiology Consultation:   Patient ID: Kimberly [name] MRN: [id number]; DOB: 07-Mar-1927  Admit date: 06/16/2020 Date of Consult: 06/17/2020  PCP:  Kimberly Labrum, MD   [place]  Cardiologist:  Kimberly Klein, MD  Advanced Practice Provider:  No care team member to display Electrophysiologist:  None   Patient Profile:   Kimberly [name] is a 87 y.o. female with a PMH of chronic combined CHF with recovery of EF, permanent atrial fibrillation s/p AV node ablation and secondary CHB s/p PPM (CRT-P device) with device at ERI as of 05/2020, HTN, HLD, RA, bipolar disorder, and CKD stage 3  who is being seen today for the evaluation of chest pain at the request of Dr. Alfredia [name].  History of Present Illness:   [name] was scheduled for a PPM generator change out 06/18/20 with Dr. Sallyanne [name], however began experiencing chest pain and palpitations, prompting her to activate EMS yesterday. She reports for the past several days she has been feeling her pacemaker continually firing. She had an episode earlier 06/16/20 described as a pounding chest pain with associate SOB that resolved spontaneously. She also reports chronic DOE which she thinks is worse over the past few days.    On arrival to the ED BP was elevated, otherwise VSS. Labs notable for K 3.2, Cr 1.42, Mg 1.7, Hgb 11.0, PLT 203, BNP 486, HsTrop 21>20>17>16. Influenza/COVID-19 negative. EKG with atrial fibrillation with V pacing, rate 73 bpm. CXR without acute findings. She was admitted to medicine and home medications were continued. Cardiology asked to evaluate for chest pain and input on upcoming PPM generator change out.  She was last evaluated by cardiology at an outpatient visit with Dr. Sallyanne [name] 02/27/20 at which time she was stable from a cardiac standpoint. He anticipated need for generator change in 3-4 months but otherwise was recommended to follow-up in 12 months. Her last echocardiogram 11/2019 showed EF 55-60%, no  RWMA, indeterminate LV diastolic function, mildly elevated PA pressures, severe biatrial enlargement, and degenerative MV with mild MR. Her last ischemic evaluation appears to be a NST in 2010 which was without ischemia. She had a LHC in 2007 that showed dense calcifications in the LAD and LM without high grade stenosis, with mild LAD/LCx stenosis.   At the time of this evaluation she reports her primary complaint is DOE. She denies any significant chest pain. Noted some discomfort but nothing significant or different in the past several months. She finds herself getting winded very easily which she thinks has been occurring for several months, though worse over the past few days. She noticed some LE edema within the past week, however this has resolved without changes in medications. She reports recent orthopnea and PND. She has occasional orthostatic dizziness but thankfully no syncope or falls. She tells me she has been struggling with frequent loose stools recently. She reports ~1-2 months ago she had an episode of diverticulitis with GI bleed requiring a blood transfusion. She states she has been off her eliquis since that time. No complaints of bleeding though continues to have loose stools 3-5 times per day.      Past Medical History:  Diagnosis Date  . Acute blood loss anemia 10/11/2012  . Acute diverticulitis 08/24/2013  . Acute on chronic combined systolic and diastolic CHF, NYHA class 4 ([place]) 11/15/2013  . Antral ulcer 10/11/2012  . Arrhythmia    atrial fibb  . Atrial fibrillation ([place])   . Bipolar affective disorder ([place]) 03/23/2017  .  Cardiomyopathy, nonischemic ([place])   . Chronic anticoagulation 10/12/2012  . CKD (chronic kidney disease) stage 3, GFR 30-59 ml/min ([facility]) 10/12/2012  . Depression   . Erosive esophagitis 10/11/2012  . Fibromyalgia   . Glaucoma   . H/O echocardiogram 2007   EF 40-45%,         . Hypertension   . Osteoarthritis   . Pacemaker    Last saw cards 07/2013  .  Scoliosis     Past Surgical History:  Procedure Laterality Date  . ABDOMINAL HYSTERECTOMY    . APPENDECTOMY    . BACK SURGERY    . BIOPSY  03/03/2017   Procedure: BIOPSY;  Surgeon: Danie Binder, MD;  Location: [facility];  Service: Endoscopy;;  gastric  . BREAST SURGERY    . CARDIAC CATHETERIZATION  12/08/2005   LAD AND LEFT MAIN WITH NO HIGH-GRADE STENOSIS. MILD DISEASE IN THE CX AND LAD SYSTEM. SEVERE LV DYSFUNCTION WITH DILATION OF THE LV. EF 15-20%. LV END-DIASTOLIC PRESSURE IS 90. +1 MR.  . CHOLECYSTECTOMY    . COLONOSCOPY N/A 03/03/2017   Dr. Oneida Alar: 5 colon polyps removed, adenomatous.  Diverticulosis.  99% of the colon was cleared but the cecum was not adequately seen.  . CYSTOSCOPY N/A 02/24/2013   Procedure: CYSTOSCOPY WITH URETHRAL DILITATION;  Surgeon: Marissa Nestle, MD;  Location: [facility];  Service: Urology;  Laterality: N/A;  . DOPPLER ECHOCARDIOGRAPHY N/A 05/30/2010   LV SIZE IS NORMAL. LV SYSTOLIC FUNCTION IS LOW NORMAL. EF=50-55%. MILD INFERIOR HYPOKINESIS.MILD TO MODERATE POSTERIOR WALL HYPOKINESIS.PACEMAKER LEAD IN THE RV. LA IS MILDLY DILATED. RA IS MODERATE TO SEVERLY DILATED. PACEMAKER LEAD IN THE RA. MILD CALCICICATION OF THE MV APPARATUS. MODERATE MR. MILD TO MODERATE TR. MILD PHTN.AV MILDLY SCLEROTIC.  [name] ESOPHAGOGASTRODUODENOSCOPY N/A 10/13/2012   Dr. Gala Romney: severe ulcerative reflux esophagitis, question of Barrett's but negative path, single deep prepyloric antral ulcer, negative H.pylori  . ESOPHAGOGASTRODUODENOSCOPY N/A 03/03/2017   Dr. Oneida Alar: Gastritis/duodenitis, no H. pylori  . HERNIA REPAIR     right inguinal hernia and umbilical  . LOWETR EXT VENOUS Bilateral 11-08-10   R & L- NO EVIDENCE OF THROMBUS OR THROMBOPHLEBITIS. THERE IS MILD AMOUNT OF SUBCUTANEOUS EDEMA NOTED WITHIN THE LEFT CALF AND ANKLE. R & L GSV AND SSV- NO VENOUS INSUFF NOTED.  [name] NECK SURGERY    . NUCLEAR STRESS TEST N/A 02/13/2009   NORMAL PATTERN OF PERFUSION IN ALL REGIONS. POST  STRESS VENTICULAR SIZE IS NORMAL. POST  STESS EF 85%.  NORMAL MYOCARDIAL PERFUSION STUDY.  [name] OPEN REDUCTION INTERNAL FIXATION (ORIF) DISTAL PHALANX Left 11/16/2018   Procedure: MIDDLE FINGER OPEN REDUCTION VERSUS RECONSTRUCTION;  Surgeon: Roseanne Kaufman, MD;  Location: [facility];  Service: Orthopedics;  Laterality: Left;  . PACEMAKER INSERTION    . POLYPECTOMY  03/03/2017   Procedure: POLYPECTOMY;  Surgeon: Danie Binder, MD;  [facility];  Service: Endoscopy;;  colon  . TONSILLECTOMY    . YAG LASER APPLICATION Bilateral 10/09/3708   Procedure: YAG LASER APPLICATION;  Surgeon: Williams Che, MD;  Location: [facility];  Service: Ophthalmology;  Laterality: Bilateral;     Home Medications:  Prior to Admission medications   Medication Sig Start Date End Date Taking? Authorizing Provider  acetaminophen (TYLENOL) 325 MG tablet Take 2 tablets (650 mg total) by mouth every 6 (six) hours as needed for mild pain, fever or headache (or Fever >/= 101). 10/17/18   Roxan Hockey, MD  apixaban (ELIQUIS) 2.5 MG TABS tablet  Take 1 tablet (2.5 mg total) by mouth 2 (two) times daily. 02/03/20   Croitoru, Mihai, MD  calcium carbonate (TUMS - DOSED IN MG ELEMENTAL CALCIUM) 500 MG chewable tablet Chew 500 mg by mouth daily as needed for heartburn or indigestion.    [provider]  carvedilol (COREG) 6.25 MG tablet TAKE 1 TABLET BY MOUTH  TWICE DAILY WITH A MEAL Patient taking differently: Take 6.25 mg by mouth 2 (two) times daily with a meal. 01/26/20   Croitoru, Mihai, MD  colchicine 0.6 MG tablet Take 0.6-1.2 mg by mouth See admin instructions. Take 1.2 mg at one time ,then take 0.6 mg one hour later as needed 06/13/20   [provider]  famotidine (PEPCID) 20 MG tablet Take 20 mg by mouth 2 (two) times daily. 06/08/20   [provider]  furosemide (LASIX) 20 MG tablet Take 1 tablet (20 mg total) by mouth daily. 02/06/20   Croitoru, Mihai, MD  lactulose (CHRONULAC) 10 GM/15ML  solution Take 15 mLs by mouth daily as needed for constipation. 06/08/20   [provider]  Multiple Vitamins-Minerals (PRESERVISION AREDS 2) CAPS Take 1 capsule by mouth 2 (two) times daily.     [provider]  pantoprazole (PROTONIX) 40 MG tablet Take 40 mg by mouth daily as needed for heartburn. 05/22/20   [provider]  Polyethylene Glycol 400 (BLINK TEARS) 0.25 % SOLN Place 1 drop into both eyes daily as needed (for dry eye relief).    [provider]  predniSONE (DELTASONE) 20 MG tablet Take 20 mg by mouth 2 (two) times daily with a meal. Taper 06/11/20   [provider]  sertraline (ZOLOFT) 25 MG tablet Take 25 mg by mouth daily. 06/13/20   [provider]  traMADol (ULTRAM) 50 MG tablet Take 1 tablet (50 mg total) by mouth every 6 (six) hours as needed. 11/16/19   Noemi Chapel, MD  Vitamin D, Ergocalciferol, (DRISDOL) 1.25 MG (50000 UNIT) CAPS capsule Take 50,000 Units by mouth once a week. 01/14/20   [provider]    Inpatient Medications: Scheduled Meds: . carvedilol  6.25 mg Oral BID WC  . furosemide  20 mg Intravenous Once  . furosemide  20 mg Oral Daily  . pantoprazole  40 mg Oral Daily  . potassium chloride  40 mEq Oral Once  . sertraline  25 mg Oral Daily   Continuous Infusions:  PRN Meds: acetaminophen  Allergies:    Allergies  Allergen Reactions  . Ciprofloxacin Other (See Comments)    Possibly caused diarrhea November 2018  . Flagyl [Metronidazole] Other (See Comments)    Possibly caused diarrhea November 2018  . Papaya Derivatives Hives  . Iodine Rash and Other (See Comments)    REACTION:If injected,  Rash/irritated skin reaction "welts"  . Penicillins Hives    DID THE REACTION INVOLVE: Swelling of the face/tongue/throat, SOB, or low BP? Unknown Sudden or severe rash/hives, skin peeling, or the inside of the mouth or nose? Yes Did it require medical treatment? Yes When did it last happen?61 or 87  years old If all above answers are "NO", may proceed with cephalosporin use.  . Sulfa Antibiotics Rash    Social History:   Social History   Socioeconomic History  . Marital status: Widowed    Spouse name: Not on file  . Number of children: Not on file  . Years of education: Not on file  . Highest education level: Some college, no degree  Occupational History  .  Occupation: IT trainer: RETIRED    Comment: retired  Tobacco Use  . Smoking status: Former Smoker    Packs/day: 0.50    Years: 30.00    Pack years: 15.00    Types: Cigarettes    Quit date: 12/13/2004    Years since quitting: 15.5  . Smokeless tobacco: Never Used  . Tobacco comment: former smoker  Vaping Use  . Vaping Use: Never used  Substance and Sexual Activity  . Alcohol use: Yes    Alcohol/week: 0.0 standard drinks    Comment: history of drinking a 1/2 glass of wine in the evening  . Drug use: No  . Sexual activity: Never  Other Topics Concern  . Not on file  Social History Narrative   No home exercise program. PT ordered this week.   Social Determinants of Health   Financial Resource Strain: Low Risk   . Difficulty of Paying Living Expenses: Not very hard  Food Insecurity: No Food Insecurity  . Worried About Charity fundraiser in the Last Year: Never true  . Ran Out of Food in the Last Year: Never true  Transportation Needs: Unmet Transportation Needs  . Lack of Transportation (Medical): Yes  . Lack of Transportation (Non-Medical): No  Physical Activity: Inactive  . Days of Exercise per Week: 0 days  . Minutes of Exercise per Session: 0 min  Stress: Not on file  Social Connections: Unknown  . Frequency of Communication with Friends and Family: Twice a week  . Frequency of Social Gatherings with Friends and Family: Once a week  . Attends Religious Services: Never  . Active Member of Clubs or Organizations: No  . Attends Archivist Meetings: Not on file  . Marital  Status: Not on file  Intimate Partner Violence: Not on file    Family History:    Family History  Problem Relation Age of Onset  . Cancer Sister   . Asthma Sister   . Heart failure Brother   . Pulmonary embolism Brother   . Cancer Brother   . Colon cancer Neg Hx      ROS:  Please see the history of present illness.   All other ROS reviewed and negative.     Physical Exam/Data:   Vitals:   06/17/20 0230 06/17/20 0357 06/17/20 0821 06/17/20 1200  BP: (!) 167/82 (!) 146/82 (!) 150/87 114/64  Pulse: 70 72 76 81  Resp: 18 20 18 18   Temp:   97.7 F (36.5 C) 97.8 F (36.6 C)  TempSrc:   Oral Oral  SpO2: 96% 99% 98% 100%  Weight:  64.4 kg    Height:       No intake or output data in the 24 hours ending 06/17/20 1355 Last 3 Weights 06/17/2020 06/16/2020 02/27/2020  Weight (lbs) 141 lb 15.6 oz 145 lb 143 lb 6.4 oz  Weight (kg) 64.4 kg 65.772 kg 65.046 kg     Body mass index is 22.24 kg/m.  General:  Well nourished, well developed, in no acute distress HEENT: sclera anicteric Lymph: no adenopathy Neck: no JVD Endocrine:  No thryomegaly Vascular: No carotid bruits; distal pulses 2+ bilaterally  Cardiac:  normal S1, S2; RRR; no murmurs, rubs, or gallops Lungs:  clear to auscultation bilaterally, no wheezing, rhonchi or rales  Abd: soft, nontender, no hepatomegaly  Ext: no edema Musculoskeletal:  No deformities, BUE and BLE strength normal and equal Skin: warm and dry  Neuro:  CNs 2-12 intact,  no focal abnormalities noted Psych:  Normal affect   EKG:  The EKG was personally reviewed and demonstrates:  atrial fibrillation with V pacing, rate 73 bpm. Telemetry:  Telemetry was personally reviewed and demonstrates:  Atrial fibrillation with V pacing, rates well controlled.   Relevant CV Studies: Echocardiogram 11/2019: 1. Left ventricular ejection fraction, by estimation, is 55 to 60%. The  left ventricle has normal function. The left ventricle has no regional  wall motion  abnormalities. Left ventricular diastolic function could not  be evaluated.  2. Right ventricular systolic function is normal. The right ventricular  size is normal. There is mildly elevated pulmonary artery systolic  pressure. The estimated right ventricular systolic pressure is 85.6 mmHg.  3. Left atrial size was severely dilated.  4. Right atrial size was severely dilated.  5. The mitral valve is degenerative. Mild mitral valve regurgitation. No  evidence of mitral stenosis.  6. The aortic valve is tricuspid. Aortic valve regurgitation is not  visualized. Mild to moderate aortic valve sclerosis/calcification is  present, without any evidence of aortic stenosis.  7. The inferior vena cava is normal in size with greater than 50%  respiratory variability, suggesting right atrial pressure of 3 mmHg.   Laboratory Data:  High Sensitivity Troponin:   Recent Labs  Lab 06/16/20 2148 06/16/20 2322 06/17/20 0453 06/17/20 0608  TROPONINIHS 21* 20* 17 16     Chemistry Recent Labs  Lab 06/16/20 2148 06/17/20 1200  NA 135 137  K 3.2* 3.7  CL 101 103  CO2 21* 23  GLUCOSE 90 85  BUN 28* 27*  CREATININE 1.42* 1.40*  CALCIUM 8.5* 8.5*  GFRNONAA 35* 36*  ANIONGAP 13 11    Recent Labs  Lab 06/17/20 1200  PROT 6.0*  ALBUMIN 3.1*  AST 20  ALT 15  ALKPHOS 47  BILITOT 0.7   Hematology Recent Labs  Lab 06/16/20 2148 06/17/20 1200  WBC 9.7 8.2  RBC 3.71* 3.77*  HGB 11.0* 10.9*  HCT 33.1* 33.9*  MCV 89.2 89.9  MCH 29.6 28.9  MCHC 33.2 32.2  RDW 18.5* 18.3*  PLT 203 191   BNP Recent Labs  Lab 06/16/20 2148  BNP 486.3*    DDimer No results for input(s): DDIMER in the last 168 hours.   Radiology/Studies:  DG Chest Portable 1 View  Result Date: 06/16/2020 CLINICAL DATA:  Pacemaker problem. Patient reports the pacemaker is not working. EXAM: PORTABLE CHEST 1 VIEW COMPARISON:  06/14/2020 FINDINGS: Left anterior chest wall biventricular cardioverter-defibrillator  is stable in appearance, leads unchanged, from the prior exam. Cardiac silhouette is mildly enlarged. Diffuse aortic atherosclerotic calcifications. No mediastinal or hilar masses. Lungs are hyperexpanded. Left lung base not well visualized due to the superimposed heart and the semi-erect AP technique. Stables scarring at the apices including pleural base calcification. Lungs otherwise clear. No convincing pleural effusion and no pneumothorax. Skeletal structures are demineralized but grossly intact. IMPRESSION: 1. No acute cardiopulmonary disease. 2. No change in the left anterior chest wall biventricular cardioverter-defibrillator. 3. Mild cardiomegaly and aortic atherosclerosis. Electronically Signed   By: Lajean Manes M.D.   On: 06/16/2020 22:48     Assessment and Plan:   1. Acute on chronic combined CHF with recovery of EF: she reports some worsening DOE over the past few days with associated orthopnea and PND. BNP elevated to 480s. CXR without acute findings. Symptoms seem out of proportion to physical exam findings.  - Will give a dose of IV lasix in anticipation of  her procedure tomorrow to improve her ability to lay flat. - Resume po lasix tomorrow - Continue carvedilol  2. CHB s/p PPM: PPM dependent following AV nodal ablation. Device has reached ERI as of 05/2020. Planned for generator change out 06/18/20.  - Will make NPO after MN - Hold eliquis tonight in anticipation of her procedure 06/18/20   3. Non-obstructive CAD: patient denies significant chest pain, though this was noted on H&P. EKG with atrial fibrillation, V paced, 73 bpm. HsTrop elevated to 21 and trended down - not c/w ACS. BNP in the 400s. CXR without acute findings. Last echo 11/2019 showed EF 55-60%, no RWMA, indeterminate LV diastolic function, mildly elevated PA pressures, severe biatrial enlargement, and degenerative MV with mild MR. No recent ischemic evaluation - NST in 2010 was non-ischemic and remote cath in 2007 showed  mild LAD/LCx stenosis.  - Continue to monitor symptoms for now - No plans for further ischemic work-up this admission  4. Permanent atrial fibrillation: rates stable on EKG/telemetry this admission. She reports she has been off eliquis for the past 1-2 months following a GI bleed. No recent complaints of bleeding. Suspect eliquis can be restarted following PPM generator change out. - Continue carvedilol  - Anticipate restarting eliquis following generator change out - she does not meet criteria for dose adjusted lasix at this point given Cr 1.4 and weight 64.4kg.   5. HTN: BP elevated on admission, though last BP reading normotensive after receiving home carvedilol and lasix - Continue to monitor closely and adjust medications as needed  6. Electrolyte imbalance: K 3.2>3.7 and Mg 1.7 on admission. Repleted with repeat BMET pending - Continue to monitor closely with goal K>4, Mg >2 - will give an additional K 40 mEq now with her IV lasix.     Risk Assessment/Risk Scores:        New York Heart Association (NYHA) Functional Class NYHA Class III  CHA2DS2-VASc Score = 6  This indicates a 9.7% annual risk of stroke. The patient's score is based upon: CHF History: Yes HTN History: Yes Diabetes History: No Stroke History: No Vascular Disease History: Yes Age Score: 2 Gender Score: 1        For questions or updates, please contact [place] Please consult [email or web address] for contact info under    Signed, Abigail Butts, PA-C  06/17/2020 1:55 PM    I have seen, examined the patient, and reviewed the above assessment and plan.  Changes to above are made where necessary.  On exam, RR (paced).  She is well appearing.  We will gently diurese.  Continue with plans for pacemaker generator change tomorrow.  Hold eliquis in the interim.  Co Sign: Thompson Grayer, MD 06/17/2020 3:10 PM

## 2020-06-17 NOTE — Progress Notes (Signed)
Care started prior to midnight in the emergency room and patient was admitted early this morning after midnight by Dr. Harrold Donath and I am in current agreement with his assessment and plan.  Additional changes to the plan of care been made accordingly.  The patient is a 87 year old pleasant Caucasian female with a past medical history significant for but not limited to atrial fibrillation on anticoagulation with Eliquis as well as status post AV node ablation and biventricular pacemaker placement, CKD stage IIIb, history of combined chronic systolic and diastolic CHF, depression, history of GERD and erosive esophagitis, history of glaucoma, hypertension, osteoarthritis as well as other comorbidities including bipolar affective disorder who presented with a chief complaint of pain with palpitations, shortness of breath on exertion and headache.  Patient also states that she started having developing some diarrhea..  She called EMS due to her chest pain and palpitations as well as dyspnea and states that she feels her pacemaker is been continually firing for the last several days.  She had one episode of chest pain earlier with associated shortness of breath and continues to have significant dyspnea on exertion which is chronic but feels that she is worse in the last few days.  Patient also reports having a headache across her head which is not usual for her.  She denies any other complaints including slurred speech, drooping face, vision changes or numbness.  She also denies any cough, nausea, vomiting abdominal pain but then did admit to having some diarrhea.  She is currently scheduled to have her pacemaker battery replaced on 06/18/2020 and she lives at home and uses a walker to ambulate and usually is very independent.  In the ED she presented for her chest pain and palpitations as well as dyspnea and initial troponin was 21 with a baseline of 7.  Chest x-ray was negative for any pulmonary edema or any acute  cardiopulmonary process.  Currently she is being admitted for the following and treated for but not limited to:  Chest Pain r/o ACS -Patient placed on cardiac telemetry for observation for chest pain.   -Obtain serial troponin levels and flat and Negative.   -Consult Cardiology for further evaluation and Recommendations -Continue with Eliquis which Ms. Locascio takes chronically.  Monitor blood pressure.   -Nitroglycerin as needed.  Supplemental oxygen as needed to maintain O2 sat between 92-96%  Essential Hypertension -Continue home dose of Carvedilol 6.25 mg pO BID.  -Continue to Monitor BP per protocol -C/w Lasix 20 mg po Daily   CKD (chronic kidney disease) stage 3b, GFR 30-59 ml/min  -Stable -Around baseline -BUN/Cr on Admission was 28/1.42; Repeat is pending -Avoid Nephrotoxic Medications, Contrast Dyes, Hypotension and Renally adjust medications -Repeat CMP in the AM   Chronic Combined Systolic and Diastolic Heart Failure  -Appeared Euvolemic despite BNP being 486.3.  -C/w Home Lasix -Cardiology Consulted for further evaluation and recommendations -Strict I's and O's and Daily Weights -Cardiology consulted for further evaluation and recommendations  Dyspnea on Exertion, worsening  -Continue montior on telemetry. Supplemental oxygen as needed.  -SpO2: 100 % -C/w Lasix  -COVID and Influenza A/B Negative  -CXR this AM on Admission showed "No acute cardiopulmonary disease. No change in the left anterior chest wall biventricular cardioverter-defibrillator. Mild cardiomegaly and aortic atherosclerosis." -Cardiology consulted and may need repeat ECHO  Diarrhea -Improving  Normocytic Anemia -Patient's Hgb/Hct is 11.0/33.1 -Check Anemia Panel in the AM -Continue to Monitor for S/Sx of Bleeding; Currently no overt bleeding noted -Repeat CBC in  the AM  Permanent Atrial Fibrillation -Chronic. Continue Carvedilol as above and Apixaban 2.5 mg po BID -C/w Telemetry   GERD  and Erosive Esophagitis  -C/w Pantoprazole 40 mg po Daily   Depression and Hx of Bipolar Affective Disorder -C/w Sertraline 25 mg po Daily   Biventricular cardiac pacemaker - [employer] Consulta -She is scheduled to have an pacemaker replaced on June 18, 2020  -Cardiology consulted for further evaluation and recommendations   Chronic Anticoagulation -No bleeding.  Continue Eliquis  Hypokalemia -Potassium was mildly decreased at 3.2.   -Patient given oral potassium supplementation in the emergency room.  Magnesium level was normal at 1.7 -Repeat is pending this AM -Continue to Monitor and Replete as Necessary -Repeat CMP in the AM  Prolonged QT interval -Avoid medications which could further prolong QT interval  We will continue monitor patient's clinical response to intervention and repeat blood work in the a.m. and follow-up on Cardiology recommendations

## 2020-06-17 NOTE — H&P (Addendum)
History and Physical    Kimberly [name] PJK:[id number] DOB: 02-14-1927 DOA: 06/16/2020  PCP: Curlene Labrum, MD   Patient coming from:  Home  Chief Complaint: chest pain with palpitations, SOB, headache  HPI: Kimberly [name] is a 87 y.o. female with medical history significant for history of Afib on Eliquis s/p AV node ablation and biventricular pacemaker (Medtronic Consulta CRT-P), CKD, and CHF (LVEF 55-60%) who presents tofrom home via EMS for chest pain, palpitations. Patient states she has been feeling her pacemaker continually firing for the last several days. One episode of pounding chest pain earlier today associated with SOB that has since resolved. She also reports DOE which is chronic but has been worse the past few days. She also reports a pounding headache across her forehead for the past few hours. No slurred speech, drooping face, vision change or numbness of extremities/face/body.  Denies cough, N/V, abdominal pain, lightheadedness, dizziness, weakness or recent falls. Patient scheduled to have pacemaker battery replaced on 06/18/20. Lives alone at home and uses walker to ambulate. She is very independent. No change in her medications recently she states.   ED Course: She has mild increase in troponin to 21 which is above her baseline of 7.  No current chest pain. CXR is negative for pulmonary edema or acute cardiorespiratory process.   Review of Systems:  General: Denies weakness, fever, chills, weight loss, night sweats.  Denies dizziness.  Denies change in appetite HENT: Denies head trauma, denies change in hearing, tinnitus.  Denies nasal congestion.  Denies sore throat, sores in mouth.  Denies difficulty swallowing Eyes: Denies blurry vision, pain in eye, drainage.  Denies discoloration of eyes. Neck: Denies pain.  Denies swelling.  Denies pain with movement. Cardiovascular: Reports chest pain and palpitations.  Denies edema.  Denies orthopnea Respiratory: Reports shortness of  breath. Denies cough.  Denies wheezing.  Denies sputum production Gastrointestinal: Denies abdominal pain, swelling.  Denies nausea, vomiting, diarrhea.  Denies melena.  Denies hematemesis. Musculoskeletal: Denies limitation of movement.  Denies swelling. Denies arthralgias or myalgias. Genitourinary: Denies pelvic pain.  Denies urinary frequency or hesitancy.  Denies dysuria.  Skin: Denies rash.  Denies petechiae, purpura, ecchymosis. Neurological: Denies syncope. Denies seizure activity. Denies slurred speech, drooping face.  Denies visual change. Psychiatric: Denies depression, anxiety.  Denies hallucinations.  Past Medical History:  Diagnosis Date  . Acute blood loss anemia 10/11/2012  . Acute diverticulitis 08/24/2013  . Acute on chronic combined systolic and diastolic CHF, NYHA class 4 ([place]) 11/15/2013  . Antral ulcer 10/11/2012  . Arrhythmia    atrial fibb  . Atrial fibrillation ([place])   . Bipolar affective disorder ([place]) 03/23/2017  . Cardiomyopathy, nonischemic ([place])   . Chronic anticoagulation 10/12/2012  . CKD (chronic kidney disease) stage 3, GFR 30-59 ml/min ([facility]) 10/12/2012  . Depression   . Erosive esophagitis 10/11/2012  . Fibromyalgia   . Glaucoma   . H/O echocardiogram 2007   EF 40-45%,         . Hypertension   . Osteoarthritis   . Pacemaker    Last saw cards 07/2013  . Scoliosis     Past Surgical History:  Procedure Laterality Date  . ABDOMINAL HYSTERECTOMY    . APPENDECTOMY    . BACK SURGERY    . BIOPSY  03/03/2017   Procedure: BIOPSY;  Surgeon: Danie Binder, MD;  Location: [facility];  Service: Endoscopy;;  gastric  . BREAST SURGERY    . CARDIAC CATHETERIZATION  12/08/2005  LAD AND LEFT MAIN WITH NO HIGH-GRADE STENOSIS. MILD DISEASE IN THE CX AND LAD SYSTEM. SEVERE LV DYSFUNCTION WITH DILATION OF THE LV. EF 15-20%. LV END-DIASTOLIC PRESSURE IS 90. +1 MR.  . CHOLECYSTECTOMY    . COLONOSCOPY N/A 03/03/2017   Dr. Oneida Alar: 5 colon polyps removed, adenomatous.   Diverticulosis.  99% of the colon was cleared but the cecum was not adequately seen.  . CYSTOSCOPY N/A 02/24/2013   Procedure: CYSTOSCOPY WITH URETHRAL DILITATION;  Surgeon: Marissa Nestle, MD;  Location: [facility];  Service: Urology;  Laterality: N/A;  . DOPPLER ECHOCARDIOGRAPHY N/A 05/30/2010   LV SIZE IS NORMAL. LV SYSTOLIC FUNCTION IS LOW NORMAL. EF=50-55%. MILD INFERIOR HYPOKINESIS.MILD TO MODERATE POSTERIOR WALL HYPOKINESIS.PACEMAKER LEAD IN THE RV. LA IS MILDLY DILATED. RA IS MODERATE TO SEVERLY DILATED. PACEMAKER LEAD IN THE RA. MILD CALCICICATION OF THE MV APPARATUS. MODERATE MR. MILD TO MODERATE TR. MILD PHTN.AV MILDLY SCLEROTIC.  [name] ESOPHAGOGASTRODUODENOSCOPY N/A 10/13/2012   Dr. Gala Romney: severe ulcerative reflux esophagitis, question of Barrett's but negative path, single deep prepyloric antral ulcer, negative H.pylori  . ESOPHAGOGASTRODUODENOSCOPY N/A 03/03/2017   Dr. Oneida Alar: Gastritis/duodenitis, no H. pylori  . HERNIA REPAIR     right inguinal hernia and umbilical  . LOWETR EXT VENOUS Bilateral 11-08-10   R & L- NO EVIDENCE OF THROMBUS OR THROMBOPHLEBITIS. THERE IS MILD AMOUNT OF SUBCUTANEOUS EDEMA NOTED WITHIN THE LEFT CALF AND ANKLE. R & L GSV AND SSV- NO VENOUS INSUFF NOTED.  [name] NECK SURGERY    . NUCLEAR STRESS TEST N/A 02/13/2009   NORMAL PATTERN OF PERFUSION IN ALL REGIONS. POST STRESS VENTICULAR SIZE IS NORMAL. POST  STESS EF 85%.  NORMAL MYOCARDIAL PERFUSION STUDY.  [name] OPEN REDUCTION INTERNAL FIXATION (ORIF) DISTAL PHALANX Left 11/16/2018   Procedure: MIDDLE FINGER OPEN REDUCTION VERSUS RECONSTRUCTION;  Surgeon: Roseanne Kaufman, MD;  Location: [facility];  Service: Orthopedics;  Laterality: Left;  . PACEMAKER INSERTION    . POLYPECTOMY  03/03/2017   Procedure: POLYPECTOMY;  Surgeon: Danie Binder, MD;  [facility];  Service: Endoscopy;;  colon  . TONSILLECTOMY    . YAG LASER APPLICATION Bilateral 10/07/5407   Procedure: YAG LASER APPLICATION;  Surgeon: Williams Che, MD;   Location: [facility];  Service: Ophthalmology;  Laterality: Bilateral;    Social History  reports that she quit smoking about 15 years ago. Her smoking use included cigarettes. She has a 15.00 pack-year smoking history. She has never used smokeless tobacco. She reports current alcohol use. She reports that she does not use drugs.  Allergies  Allergen Reactions  . Ciprofloxacin Other (See Comments)    Possibly caused diarrhea November 2018  . Flagyl [Metronidazole] Other (See Comments)    Possibly caused diarrhea November 2018  . Papaya Derivatives Hives  . Iodine Rash and Other (See Comments)    REACTION:If injected,  Rash/irritated skin reaction "welts"  . Penicillins Hives    DID THE REACTION INVOLVE: Swelling of the face/tongue/throat, SOB, or low BP? Unknown Sudden or severe rash/hives, skin peeling, or the inside of the mouth or nose? Yes Did it require medical treatment? Yes When did it last happen?73 or 87 years old If all above answers are "NO", may proceed with cephalosporin use.  . Sulfa Antibiotics Rash    Family History  Problem Relation Age of Onset  . Cancer Sister   . Asthma Sister   . Heart failure Brother   . Pulmonary embolism Brother   . Cancer Brother   .  Colon cancer Neg Hx      Prior to Admission medications   Medication Sig Start Date End Date Taking? Authorizing Provider  acetaminophen (TYLENOL) 325 MG tablet Take 2 tablets (650 mg total) by mouth every 6 (six) hours as needed for mild pain, fever or headache (or Fever >/= 101). 10/17/18   Roxan Hockey, MD  apixaban (ELIQUIS) 2.5 MG TABS tablet Take 1 tablet (2.5 mg total) by mouth 2 (two) times daily. 02/03/20   Croitoru, Mihai, MD  calcium carbonate (TUMS - DOSED IN MG ELEMENTAL CALCIUM) 500 MG chewable tablet Chew 500 mg by mouth daily as needed for heartburn or indigestion.    [provider]  carvedilol (COREG) 6.25 MG tablet TAKE 1 TABLET BY MOUTH  TWICE DAILY WITH A MEAL Patient  taking differently: Take 6.25 mg by mouth 2 (two) times daily with a meal. 01/26/20   Croitoru, Mihai, MD  colchicine 0.6 MG tablet Take 0.6-1.2 mg by mouth See admin instructions. Take 1.2 mg at one time ,then take 0.6 mg one hour later as needed 06/13/20   [provider]  famotidine (PEPCID) 20 MG tablet Take 20 mg by mouth 2 (two) times daily. 06/08/20   [provider]  furosemide (LASIX) 20 MG tablet Take 1 tablet (20 mg total) by mouth daily. 02/06/20   Croitoru, Mihai, MD  lactulose (CHRONULAC) 10 GM/15ML solution Take 15 mLs by mouth daily as needed for constipation. 06/08/20   [provider]  Multiple Vitamins-Minerals (PRESERVISION AREDS 2) CAPS Take 1 capsule by mouth 2 (two) times daily.     [provider]  pantoprazole (PROTONIX) 40 MG tablet Take 40 mg by mouth daily as needed for heartburn. 05/22/20   [provider]  Polyethylene Glycol 400 (BLINK TEARS) 0.25 % SOLN Place 1 drop into both eyes daily as needed (for dry eye relief).    [provider]  predniSONE (DELTASONE) 20 MG tablet Take 20 mg by mouth 2 (two) times daily with a meal. Taper 06/11/20   [provider]  sertraline (ZOLOFT) 25 MG tablet Take 25 mg by mouth daily. 06/13/20   [provider]  traMADol (ULTRAM) 50 MG tablet Take 1 tablet (50 mg total) by mouth every 6 (six) hours as needed. 11/16/19   Noemi Chapel, MD  Vitamin D, Ergocalciferol, (DRISDOL) 1.25 MG (50000 UNIT) CAPS capsule Take 50,000 Units by mouth once a week. 01/14/20   [provider]    Physical Exam: Vitals:   06/16/20 2200 06/16/20 2230 06/17/20 0000 06/17/20 0010  BP: (!) 168/81 (!) 165/83 (!) 183/80 (!) 183/80  Pulse: 72 80 70 70  Resp: 18 20 16    Temp:      TempSrc:      SpO2: 99% 99% 97%   Weight:      Height:        Constitutional: NAD, calm, comfortable Vitals:   06/16/20 2200 06/16/20 2230 06/17/20 0000 06/17/20 0010  BP: (!) 168/81 (!) 165/83 (!) 183/80 (!)  183/80  Pulse: 72 80 70 70  Resp: 18 20 16    Temp:      TempSrc:      SpO2: 99% 99% 97%   Weight:      Height:       General: WDWN, Alert and oriented x3.  Eyes: EOMI, PERRL, conjunctivae normal.  Sclera nonicteric HENT:  [place]/AT, external ears normal.  Nares patent without epistasis.  Mucous membranes are moist.  Neck: Soft, normal range of motion,  supple, no masses, no thyromegaly. Trachea midline Respiratory: clear to auscultation bilaterally, no wheezing, no crackles. Normal respiratory effort. No accessory muscle use.  Cardiovascular: Regular rate and rhythm, no murmurs / rubs / gallops. No extremity edema. 1+ pedal pulses. Pacemaker in left upper chest.  Abdomen: Soft, no tenderness, nondistended, no rebound or guarding.  No masses palpated. Bowel sounds normoactive Musculoskeletal: FROM. no cyanosis. Normal muscle tone.  Skin: Warm, dry, intact no rashes, lesions, ulcers. No induration Neurologic: CN 2-12 grossly intact.  Normal speech.  Sensation intact, Strength 5/5 in all extremities.   Psychiatric: Normal judgment and insight.  Normal mood.    Labs on Admission: I have personally reviewed following labs and imaging studies  CBC: Recent Labs  Lab 06/16/20 2148  WBC 9.7  NEUTROABS 7.3  HGB 11.0*  HCT 33.1*  MCV 89.2  PLT 283    Basic Metabolic Panel: Recent Labs  Lab 06/16/20 2148  NA 135  K 3.2*  CL 101  CO2 21*  GLUCOSE 90  BUN 28*  CREATININE 1.42*  CALCIUM 8.5*  MG 1.7    GFR: Estimated Creatinine Clearance: 25.1 mL/min (A) (by C-G formula based on SCr of 1.42 mg/dL (H)).  Liver Function Tests: No results for input(s): AST, ALT, ALKPHOS, BILITOT, PROT, ALBUMIN in the last 168 hours.  Urine analysis:    Component Value Date/Time   COLORURINE STRAW (A) 10/09/2019 [place] 10/09/2019 1744   LABSPEC 1.012 10/09/2019 1744   PHURINE 6.0 10/09/2019 1744   GLUCOSEU NEGATIVE 10/09/2019 1744   HGBUR NEGATIVE 10/09/2019 [place] 10/09/2019 1744   BILIRUBINUR neg 04/25/2014 0855   [place] 10/09/2019 1744   PROTEINUR NEGATIVE 10/09/2019 1744   UROBILINOGEN 0.2 05/26/2014 1415   NITRITE NEGATIVE 10/09/2019 1744   LEUKOCYTESUR TRACE (A) 10/09/2019 1744    Radiological Exams on Admission: DG Chest Portable 1 View  Result Date: 06/16/2020 CLINICAL DATA:  Pacemaker problem. Patient reports the pacemaker is not working. EXAM: PORTABLE CHEST 1 VIEW COMPARISON:  06/14/2020 FINDINGS: Left anterior chest wall biventricular cardioverter-defibrillator is stable in appearance, leads unchanged, from the prior exam. Cardiac silhouette is mildly enlarged. Diffuse aortic atherosclerotic calcifications. No mediastinal or hilar masses. Lungs are hyperexpanded. Left lung base not well visualized due to the superimposed heart and the semi-erect AP technique. Stables scarring at the apices including pleural base calcification. Lungs otherwise clear. No convincing pleural effusion and no pneumothorax. Skeletal structures are demineralized but grossly intact. IMPRESSION: 1. No acute cardiopulmonary disease. 2. No change in the left anterior chest wall biventricular cardioverter-defibrillator. 3. Mild cardiomegaly and aortic atherosclerosis. Electronically Signed   By: Lajean Manes M.D.   On: 06/16/2020 22:48    EKG: Independently reviewed.  AG shows a paced rhythm with no acute ST elevation or depression.  QTc 504  Assessment/Plan Principal Problem:   Chest pain Patient replaced on cardiac telemetry for observation for chest pain.  Obtain serial troponin levels.  If troponins elevate will consult cardiology. Continue with Eliquis which Ms. Iannacone takes chronically.  Monitor blood pressure.  Nitroglycerin as needed.  Supplemental oxygen as needed to maintain O2 sat between 92-96%  Active Problems:   Essential hypertension Continue home dose of Coreg. Monitor BP    CKD (chronic kidney disease) stage 3, GFR 30-59  ml/min  Stable    Chronic diastolic heart failure  Stable. No signs of volume overload. Continue home dose of lasix.     Dyspnea montior on telemetry.  Supplemental oxygen as needed.     Permanent atrial fibrillation Chronic. Continue coreg and eliquis.     Biventricular cardiac pacemaker - Medtronic Consulta She is scheduled to have an pacemaker replaced on June 18, 2020 and the patient remains in the hospital will consult cardiology to see if this procedure can be done while here    Chronic anticoagulation No bleeding.  Continue Eliquis    Hypokalemia Potassium was mildly decreased at 3.2.  Patient given oral potassium supplementation in the emergency room.  Magnesium level was normal.     Prolonged QT interval Avoid medications which could further prolong QT interval   DVT prophylaxis: Pt is on Eliquis for anticoagulation which is continued Code Status:   Full Code  Family Communication:  Diagnosis and plan discussed with patient.  Patient verbalized understanding agrees with plan.  Further recommendations to follow as clinically indicated Disposition Plan:   Patient is from:  Home  Anticipated DC to:  Home  Anticipated DC date:  Anticipate less than 2 midnight stay in the hospital  Anticipated DC barriers: No barriers to discharge identified  Admission status:  Observation  Eben Burow MD [facility]  How to contact the [facility] Attending or Consulting provider [place] or covering provider during after hours [place], for this patient?   1. Check the care team in [facility] and look for a) attending/consulting [facility] provider listed and b) the [facility] team listed 2. Log into [email or web address] and use [facility]'s universal password to access. If you do not have the password, please contact the hospital operator. 3. Locate the [facility] provider you are looking for under [facility] and page to a number that you can be directly reached. 4. If you still have difficulty  reaching the provider, please page the [facility] (Director on Call) for the Hospitalists listed on amion for assistance.  06/17/2020, 12:16 AM

## 2020-06-17 NOTE — Consult Note (Addendum)
Cardiology Consultation:   Patient ID: Kimberly [name] MRN: [id number]; DOB: [birth year]  Admit date: 06/16/2020 Date of Consult: 06/17/2020  PCP:  Curlene Labrum, MD   [place]  Cardiologist:  Sanda Klein, MD  Advanced Practice Provider:  No care team member to display Electrophysiologist:  None   Patient Profile:   Kimberly [name] is a 87 y.o. female with a PMH of chronic combined CHF with recovery of EF, permanent atrial fibrillation s/p AV node ablation and secondary CHB s/p PPM (CRT-P device) with device at ERI as of 05/2020, HTN, HLD, RA, bipolar disorder, and CKD stage 3  who is being seen today for the evaluation of chest pain at the request of Dr. Alfredia Ferguson.  History of Present Illness:   Kimberly [name] was scheduled for a PPM generator change out 06/18/20 with Dr. Sallyanne Kuster, however began experiencing chest pain and palpitations, prompting her to activate EMS yesterday. She reports for the past several days she has been feeling her pacemaker continually firing. She had an episode earlier 06/16/20 described as a pounding chest pain with associate SOB that resolved spontaneously. She also reports chronic DOE which she thinks is worse over the past few days.    On arrival to the ED BP was elevated, otherwise VSS. Labs notable for K 3.2, Cr 1.42, Mg 1.7, Hgb 11.0, PLT 203, BNP 486, HsTrop 21>20>17>16. Influenza/COVID-19 negative. EKG with atrial fibrillation with V pacing, rate 73 bpm. CXR without acute findings. She was admitted to medicine and home medications were continued. Cardiology asked to evaluate for chest pain and input on upcoming PPM generator change out.  She was last evaluated by cardiology at an outpatient visit with Dr. Sallyanne Kuster 02/27/20 at which time she was stable from a cardiac standpoint. He anticipated need for generator change in 3-4 months but otherwise was recommended to follow-up in 12 months. Her last echocardiogram 11/2019 showed EF 55-60%, no  RWMA, indeterminate LV diastolic function, mildly elevated PA pressures, severe biatrial enlargement, and degenerative MV with mild MR. Her last ischemic evaluation appears to be a NST in 2010 which was without ischemia. She had a LHC in 2007 that showed dense calcifications in the LAD and LM without high grade stenosis, with mild LAD/LCx stenosis.   At the time of this evaluation she reports her primary complaint is DOE. She denies any significant chest pain. Noted some discomfort but nothing significant or different in the past several months. She finds herself getting winded very easily which she thinks has been occurring for several months, though worse over the past few days. She noticed some LE edema within the past week, however this has resolved without changes in medications. She reports recent orthopnea and PND. She has occasional orthostatic dizziness but thankfully no syncope or falls. She tells me she has been struggling with frequent loose stools recently. She reports ~1-2 months ago she had an episode of diverticulitis with GI bleed requiring a blood transfusion. She states she has been off her eliquis since that time. No complaints of bleeding though continues to have loose stools 3-5 times per day.      Past Medical History:  Diagnosis Date  . Acute blood loss anemia 10/11/2012  . Acute diverticulitis 08/24/2013  . Acute on chronic combined systolic and diastolic CHF, NYHA class 4 ([place]) 11/15/2013  . Antral ulcer 10/11/2012  . Arrhythmia    atrial fibb  . Atrial fibrillation ([place])   . Bipolar affective disorder ([place]) 03/23/2017  .  Cardiomyopathy, nonischemic ([place])   . Chronic anticoagulation 10/12/2012  . CKD (chronic kidney disease) stage 3, GFR 30-59 ml/min ([facility]) 10/12/2012  . Depression   . Erosive esophagitis 10/11/2012  . Fibromyalgia   . Glaucoma   . H/O echocardiogram 2007   EF 40-45%,         . Hypertension   . Osteoarthritis   . Pacemaker    Last saw cards 07/2013  .  Scoliosis     Past Surgical History:  Procedure Laterality Date  . ABDOMINAL HYSTERECTOMY    . APPENDECTOMY    . BACK SURGERY    . BIOPSY  03/03/2017   Procedure: BIOPSY;  Surgeon: Danie Binder, MD;  Location: [facility];  Service: Endoscopy;;  gastric  . BREAST SURGERY    . CARDIAC CATHETERIZATION  12/08/2005   LAD AND LEFT MAIN WITH NO HIGH-GRADE STENOSIS. MILD DISEASE IN THE CX AND LAD SYSTEM. SEVERE LV DYSFUNCTION WITH DILATION OF THE LV. EF 15-20%. LV END-DIASTOLIC PRESSURE IS 90. +1 MR.  . CHOLECYSTECTOMY    . COLONOSCOPY N/A 03/03/2017   Dr. Oneida Alar: 5 colon polyps removed, adenomatous.  Diverticulosis.  99% of the colon was cleared but the cecum was not adequately seen.  . CYSTOSCOPY N/A 02/24/2013   Procedure: CYSTOSCOPY WITH URETHRAL DILITATION;  Surgeon: Marissa Nestle, MD;  Location: [facility];  Service: Urology;  Laterality: N/A;  . DOPPLER ECHOCARDIOGRAPHY N/A 05/30/2010   LV SIZE IS NORMAL. LV SYSTOLIC FUNCTION IS LOW NORMAL. EF=50-55%. MILD INFERIOR HYPOKINESIS.MILD TO MODERATE POSTERIOR WALL HYPOKINESIS.PACEMAKER LEAD IN THE RV. LA IS MILDLY DILATED. RA IS MODERATE TO SEVERLY DILATED. PACEMAKER LEAD IN THE RA. MILD CALCICICATION OF THE MV APPARATUS. MODERATE MR. MILD TO MODERATE TR. MILD PHTN.AV MILDLY SCLEROTIC.  [name] ESOPHAGOGASTRODUODENOSCOPY N/A 10/13/2012   Dr. Gala Romney: severe ulcerative reflux esophagitis, question of Barrett's but negative path, single deep prepyloric antral ulcer, negative H.pylori  . ESOPHAGOGASTRODUODENOSCOPY N/A 03/03/2017   Dr. Oneida Alar: Gastritis/duodenitis, no H. pylori  . HERNIA REPAIR     right inguinal hernia and umbilical  . LOWETR EXT VENOUS Bilateral 11-08-10   R & L- NO EVIDENCE OF THROMBUS OR THROMBOPHLEBITIS. THERE IS MILD AMOUNT OF SUBCUTANEOUS EDEMA NOTED WITHIN THE LEFT CALF AND ANKLE. R & L GSV AND SSV- NO VENOUS INSUFF NOTED.  [name] NECK SURGERY    . NUCLEAR STRESS TEST N/A 02/13/2009   NORMAL PATTERN OF PERFUSION IN ALL REGIONS. POST  STRESS VENTICULAR SIZE IS NORMAL. POST  STESS EF 85%.  NORMAL MYOCARDIAL PERFUSION STUDY.  [name] OPEN REDUCTION INTERNAL FIXATION (ORIF) DISTAL PHALANX Left 11/16/2018   Procedure: MIDDLE FINGER OPEN REDUCTION VERSUS RECONSTRUCTION;  Surgeon: Roseanne Kaufman, MD;  Location: [facility];  Service: Orthopedics;  Laterality: Left;  . PACEMAKER INSERTION    . POLYPECTOMY  03/03/2017   Procedure: POLYPECTOMY;  Surgeon: Danie Binder, MD;  [facility];  Service: Endoscopy;;  colon  . TONSILLECTOMY    . YAG LASER APPLICATION Bilateral 4/62/7035   Procedure: YAG LASER APPLICATION;  Surgeon: Williams Che, MD;  Location: [facility];  Service: Ophthalmology;  Laterality: Bilateral;     Home Medications:  Prior to Admission medications   Medication Sig Start Date End Date Taking? Authorizing Provider  acetaminophen (TYLENOL) 325 MG tablet Take 2 tablets (650 mg total) by mouth every 6 (six) hours as needed for mild pain, fever or headache (or Fever >/= 101). 10/17/18   Roxan Hockey, MD  apixaban (ELIQUIS) 2.5 MG TABS tablet  Take 1 tablet (2.5 mg total) by mouth 2 (two) times daily. 02/03/20   Croitoru, Mihai, MD  calcium carbonate (TUMS - DOSED IN MG ELEMENTAL CALCIUM) 500 MG chewable tablet Chew 500 mg by mouth daily as needed for heartburn or indigestion.    [provider]  carvedilol (COREG) 6.25 MG tablet TAKE 1 TABLET BY MOUTH  TWICE DAILY WITH A MEAL Patient taking differently: Take 6.25 mg by mouth 2 (two) times daily with a meal. 01/26/20   Croitoru, Mihai, MD  colchicine 0.6 MG tablet Take 0.6-1.2 mg by mouth See admin instructions. Take 1.2 mg at one time ,then take 0.6 mg one hour later as needed 06/13/20   [provider]  famotidine (PEPCID) 20 MG tablet Take 20 mg by mouth 2 (two) times daily. 06/08/20   [provider]  furosemide (LASIX) 20 MG tablet Take 1 tablet (20 mg total) by mouth daily. 02/06/20   Croitoru, Mihai, MD  lactulose (CHRONULAC) 10 GM/15ML  solution Take 15 mLs by mouth daily as needed for constipation. 06/08/20   [provider]  Multiple Vitamins-Minerals (PRESERVISION AREDS 2) CAPS Take 1 capsule by mouth 2 (two) times daily.     [provider]  pantoprazole (PROTONIX) 40 MG tablet Take 40 mg by mouth daily as needed for heartburn. 05/22/20   [provider]  Polyethylene Glycol 400 (BLINK TEARS) 0.25 % SOLN Place 1 drop into both eyes daily as needed (for dry eye relief).    [provider]  predniSONE (DELTASONE) 20 MG tablet Take 20 mg by mouth 2 (two) times daily with a meal. Taper 06/11/20   [provider]  sertraline (ZOLOFT) 25 MG tablet Take 25 mg by mouth daily. 06/13/20   [provider]  traMADol (ULTRAM) 50 MG tablet Take 1 tablet (50 mg total) by mouth every 6 (six) hours as needed. 11/16/19   Noemi Chapel, MD  Vitamin D, Ergocalciferol, (DRISDOL) 1.25 MG (50000 UNIT) CAPS capsule Take 50,000 Units by mouth once a week. 01/14/20   [provider]    Inpatient Medications: Scheduled Meds: . carvedilol  6.25 mg Oral BID WC  . furosemide  20 mg Intravenous Once  . furosemide  20 mg Oral Daily  . pantoprazole  40 mg Oral Daily  . potassium chloride  40 mEq Oral Once  . sertraline  25 mg Oral Daily   Continuous Infusions:  PRN Meds: acetaminophen  Allergies:    Allergies  Allergen Reactions  . Ciprofloxacin Other (See Comments)    Possibly caused diarrhea November 2018  . Flagyl [Metronidazole] Other (See Comments)    Possibly caused diarrhea November 2018  . Papaya Derivatives Hives  . Iodine Rash and Other (See Comments)    REACTION:If injected,  Rash/irritated skin reaction "welts"  . Penicillins Hives    DID THE REACTION INVOLVE: Swelling of the face/tongue/throat, SOB, or low BP? Unknown Sudden or severe rash/hives, skin peeling, or the inside of the mouth or nose? Yes Did it require medical treatment? Yes When did it last happen?64 or 87  years old If all above answers are "NO", may proceed with cephalosporin use.  . Sulfa Antibiotics Rash    Social History:   Social History   Socioeconomic History  . Marital status: Widowed    Spouse name: Not on file  . Number of children: Not on file  . Years of education: Not on file  . Highest education level: Some college, no degree  Occupational History  .  Occupation: IT trainer: RETIRED    Comment: retired  Tobacco Use  . Smoking status: Former Smoker    Packs/day: 0.50    Years: 30.00    Pack years: 15.00    Types: Cigarettes    Quit date: 12/13/2004    Years since quitting: 15.5  . Smokeless tobacco: Never Used  . Tobacco comment: former smoker  Vaping Use  . Vaping Use: Never used  Substance and Sexual Activity  . Alcohol use: Yes    Alcohol/week: 0.0 standard drinks    Comment: history of drinking a 1/2 glass of wine in the evening  . Drug use: No  . Sexual activity: Never  Other Topics Concern  . Not on file  Social History Narrative   No home exercise program. PT ordered this week.   Social Determinants of Health   Financial Resource Strain: Low Risk   . Difficulty of Paying Living Expenses: Not very hard  Food Insecurity: No Food Insecurity  . Worried About Charity fundraiser in the Last Year: Never true  . Ran Out of Food in the Last Year: Never true  Transportation Needs: Unmet Transportation Needs  . Lack of Transportation (Medical): Yes  . Lack of Transportation (Non-Medical): No  Physical Activity: Inactive  . Days of Exercise per Week: 0 days  . Minutes of Exercise per Session: 0 min  Stress: Not on file  Social Connections: Unknown  . Frequency of Communication with Friends and Family: Twice a week  . Frequency of Social Gatherings with Friends and Family: Once a week  . Attends Religious Services: Never  . Active Member of Clubs or Organizations: No  . Attends Archivist Meetings: Not on file  . Marital  Status: Not on file  Intimate Partner Violence: Not on file    Family History:    Family History  Problem Relation Age of Onset  . Cancer Sister   . Asthma Sister   . Heart failure Brother   . Pulmonary embolism Brother   . Cancer Brother   . Colon cancer Neg Hx      ROS:  Please see the history of present illness.   All other ROS reviewed and negative.     Physical Exam/Data:   Vitals:   06/17/20 0230 06/17/20 0357 06/17/20 0821 06/17/20 1200  BP: (!) 167/82 (!) 146/82 (!) 150/87 114/64  Pulse: 70 72 76 81  Resp: 18 20 18 18   Temp:   97.7 F (36.5 C) 97.8 F (36.6 C)  TempSrc:   Oral Oral  SpO2: 96% 99% 98% 100%  Weight:  64.4 kg    Height:       No intake or output data in the 24 hours ending 06/17/20 1355 Last 3 Weights 06/17/2020 06/16/2020 02/27/2020  Weight (lbs) 141 lb 15.6 oz 145 lb 143 lb 6.4 oz  Weight (kg) 64.4 kg 65.772 kg 65.046 kg     Body mass index is 22.24 kg/m.  General:  Well nourished, well developed, in no acute distress HEENT: sclera anicteric Lymph: no adenopathy Neck: no JVD Endocrine:  No thryomegaly Vascular: No carotid bruits; distal pulses 2+ bilaterally  Cardiac:  normal S1, S2; RRR; no murmurs, rubs, or gallops Lungs:  clear to auscultation bilaterally, no wheezing, rhonchi or rales  Abd: soft, nontender, no hepatomegaly  Ext: no edema Musculoskeletal:  No deformities, BUE and BLE strength normal and equal Skin: warm and dry  Neuro:  CNs 2-12 intact,  no focal abnormalities noted Psych:  Normal affect   EKG:  The EKG was personally reviewed and demonstrates:  atrial fibrillation with V pacing, rate 73 bpm. Telemetry:  Telemetry was personally reviewed and demonstrates:  Atrial fibrillation with V pacing, rates well controlled.   Relevant CV Studies: Echocardiogram 11/2019: 1. Left ventricular ejection fraction, by estimation, is 55 to 60%. The  left ventricle has normal function. The left ventricle has no regional  wall motion  abnormalities. Left ventricular diastolic function could not  be evaluated.  2. Right ventricular systolic function is normal. The right ventricular  size is normal. There is mildly elevated pulmonary artery systolic  pressure. The estimated right ventricular systolic pressure is 29.5 mmHg.  3. Left atrial size was severely dilated.  4. Right atrial size was severely dilated.  5. The mitral valve is degenerative. Mild mitral valve regurgitation. No  evidence of mitral stenosis.  6. The aortic valve is tricuspid. Aortic valve regurgitation is not  visualized. Mild to moderate aortic valve sclerosis/calcification is  present, without any evidence of aortic stenosis.  7. The inferior vena cava is normal in size with greater than 50%  respiratory variability, suggesting right atrial pressure of 3 mmHg.   Laboratory Data:  High Sensitivity Troponin:   Recent Labs  Lab 06/16/20 2148 06/16/20 2322 06/17/20 0453 06/17/20 0608  TROPONINIHS 21* 20* 17 16     Chemistry Recent Labs  Lab 06/16/20 2148 06/17/20 1200  NA 135 137  K 3.2* 3.7  CL 101 103  CO2 21* 23  GLUCOSE 90 85  BUN 28* 27*  CREATININE 1.42* 1.40*  CALCIUM 8.5* 8.5*  GFRNONAA 35* 36*  ANIONGAP 13 11    Recent Labs  Lab 06/17/20 1200  PROT 6.0*  ALBUMIN 3.1*  AST 20  ALT 15  ALKPHOS 47  BILITOT 0.7   Hematology Recent Labs  Lab 06/16/20 2148 06/17/20 1200  WBC 9.7 8.2  RBC 3.71* 3.77*  HGB 11.0* 10.9*  HCT 33.1* 33.9*  MCV 89.2 89.9  MCH 29.6 28.9  MCHC 33.2 32.2  RDW 18.5* 18.3*  PLT 203 191   BNP Recent Labs  Lab 06/16/20 2148  BNP 486.3*    DDimer No results for input(s): DDIMER in the last 168 hours.   Radiology/Studies:  DG Chest Portable 1 View  Result Date: 06/16/2020 CLINICAL DATA:  Pacemaker problem. Patient reports the pacemaker is not working. EXAM: PORTABLE CHEST 1 VIEW COMPARISON:  06/14/2020 FINDINGS: Left anterior chest wall biventricular cardioverter-defibrillator  is stable in appearance, leads unchanged, from the prior exam. Cardiac silhouette is mildly enlarged. Diffuse aortic atherosclerotic calcifications. No mediastinal or hilar masses. Lungs are hyperexpanded. Left lung base not well visualized due to the superimposed heart and the semi-erect AP technique. Stables scarring at the apices including pleural base calcification. Lungs otherwise clear. No convincing pleural effusion and no pneumothorax. Skeletal structures are demineralized but grossly intact. IMPRESSION: 1. No acute cardiopulmonary disease. 2. No change in the left anterior chest wall biventricular cardioverter-defibrillator. 3. Mild cardiomegaly and aortic atherosclerosis. Electronically Signed   By: Lajean Manes M.D.   On: 06/16/2020 22:48     Assessment and Plan:   1. Acute on chronic combined CHF with recovery of EF: she reports some worsening DOE over the past few days with associated orthopnea and PND. BNP elevated to 480s. CXR without acute findings. Symptoms seem out of proportion to physical exam findings.  - Will give a dose of IV lasix in anticipation of  her procedure tomorrow to improve her ability to lay flat. - Resume po lasix tomorrow - Continue carvedilol  2. CHB s/p PPM: PPM dependent following AV nodal ablation. Device has reached ERI as of 05/2020. Planned for generator change out 06/18/20.  - Will make NPO after MN - Hold eliquis tonight in anticipation of her procedure 06/18/20   3. Non-obstructive CAD: patient denies significant chest pain, though this was noted on H&P. EKG with atrial fibrillation, V paced, 73 bpm. HsTrop elevated to 21 and trended down - not c/w ACS. BNP in the 400s. CXR without acute findings. Last echo 11/2019 showed EF 55-60%, no RWMA, indeterminate LV diastolic function, mildly elevated PA pressures, severe biatrial enlargement, and degenerative MV with mild MR. No recent ischemic evaluation - NST in 2010 was non-ischemic and remote cath in 2007 showed  mild LAD/LCx stenosis.  - Continue to monitor symptoms for now - No plans for further ischemic work-up this admission  4. Permanent atrial fibrillation: rates stable on EKG/telemetry this admission. She reports she has been off eliquis for the past 1-2 months following a GI bleed. No recent complaints of bleeding. Suspect eliquis can be restarted following PPM generator change out. - Continue carvedilol  - Anticipate restarting eliquis following generator change out - she does not meet criteria for dose adjusted lasix at this point given Cr 1.4 and weight 64.4kg.   5. HTN: BP elevated on admission, though last BP reading normotensive after receiving home carvedilol and lasix - Continue to monitor closely and adjust medications as needed  6. Electrolyte imbalance: K 3.2>3.7 and Mg 1.7 on admission. Repleted with repeat BMET pending - Continue to monitor closely with goal K>4, Mg >2 - will give an additional K 40 mEq now with her IV lasix.     Risk Assessment/Risk Scores:        New York Heart Association (NYHA) Functional Class NYHA Class III  CHA2DS2-VASc Score = 6  This indicates a 9.7% annual risk of stroke. The patient's score is based upon: CHF History: Yes HTN History: Yes Diabetes History: No Stroke History: No Vascular Disease History: Yes Age Score: 2 Gender Score: 1        For questions or updates, please contact [place] Please consult [email or web address] for contact info under    Signed, Abigail Butts, PA-C  06/17/2020 1:55 PM    I have seen, examined the patient, and reviewed the above assessment and plan.  Changes to above are made where necessary.  On exam, RR (paced).  She is well appearing.  We will gently diurese.  Continue with plans for pacemaker generator change tomorrow.  Hold eliquis in the interim.  Co Sign: Thompson Grayer, MD 06/17/2020 3:10 PM

## 2020-06-18 ENCOUNTER — Encounter (HOSPITAL_COMMUNITY)
Admission: EM | Disposition: A | Discharge status: Home / Self Care | Payer: Self-pay | Source: Home / Self Care | Attending: Emergency Medicine

## 2020-06-18 ENCOUNTER — Ambulatory Visit (HOSPITAL_COMMUNITY): Admission: RE | Admit: 2020-06-18 | Payer: Medicare HMO | Source: Home / Self Care | Admitting: Cardiovascular Disease

## 2020-06-18 ENCOUNTER — Other Ambulatory Visit: Payer: Self-pay

## 2020-06-18 DIAGNOSIS — I5043 Acute on chronic combined systolic (congestive) and diastolic (congestive) heart failure: Secondary | ICD-10-CM

## 2020-06-18 DIAGNOSIS — E876 Hypokalemia: Secondary | ICD-10-CM | POA: Diagnosis not present

## 2020-06-18 DIAGNOSIS — I5032 Chronic diastolic (congestive) heart failure: Secondary | ICD-10-CM | POA: Diagnosis not present

## 2020-06-18 DIAGNOSIS — I442 Atrioventricular block, complete: Secondary | ICD-10-CM

## 2020-06-18 DIAGNOSIS — I10 Essential (primary) hypertension: Secondary | ICD-10-CM | POA: Diagnosis not present

## 2020-06-18 DIAGNOSIS — I130 Hypertensive heart and chronic kidney disease with heart failure and stage 1 through stage 4 chronic kidney disease, or unspecified chronic kidney disease: Secondary | ICD-10-CM | POA: Diagnosis not present

## 2020-06-18 DIAGNOSIS — T82111A Breakdown (mechanical) of cardiac pulse generator (battery), initial encounter: Secondary | ICD-10-CM | POA: Diagnosis not present

## 2020-06-18 DIAGNOSIS — Z20822 Contact with and (suspected) exposure to covid-19: Secondary | ICD-10-CM | POA: Diagnosis not present

## 2020-06-18 DIAGNOSIS — R0600 Dyspnea, unspecified: Secondary | ICD-10-CM | POA: Diagnosis not present

## 2020-06-18 DIAGNOSIS — R079 Chest pain, unspecified: Secondary | ICD-10-CM | POA: Diagnosis not present

## 2020-06-18 DIAGNOSIS — I4821 Permanent atrial fibrillation: Secondary | ICD-10-CM | POA: Diagnosis not present

## 2020-06-18 DIAGNOSIS — Z950 Presence of cardiac pacemaker: Secondary | ICD-10-CM | POA: Diagnosis not present

## 2020-06-18 DIAGNOSIS — Z7901 Long term (current) use of anticoagulants: Secondary | ICD-10-CM | POA: Diagnosis not present

## 2020-06-18 DIAGNOSIS — Z45010 Encounter for checking and testing of cardiac pacemaker pulse generator [battery]: Secondary | ICD-10-CM | POA: Diagnosis not present

## 2020-06-18 DIAGNOSIS — N1832 Chronic kidney disease, stage 3b: Secondary | ICD-10-CM | POA: Diagnosis not present

## 2020-06-18 HISTORY — PX: PPM GENERATOR CHANGEOUT: EP1233

## 2020-06-18 LAB — COMPREHENSIVE METABOLIC PANEL
ALT: 14 U/L (ref 0–44)
AST: 16 U/L (ref 15–41)
Albumin: 3 g/dL — ABNORMAL LOW (ref 3.5–5.0)
Alkaline Phosphatase: 40 U/L (ref 38–126)
Anion gap: 12 (ref 5–15)
BUN: 30 mg/dL — ABNORMAL HIGH (ref 8–23)
CO2: 21 mmol/L — ABNORMAL LOW (ref 22–32)
Calcium: 8 mg/dL — ABNORMAL LOW (ref 8.9–10.3)
Chloride: 104 mmol/L (ref 98–111)
Creatinine, Ser: 1.49 mg/dL — ABNORMAL HIGH (ref 0.44–1.00)
GFR, Estimated: 33 mL/min — ABNORMAL LOW (ref >60)
Glucose, Bld: 90 mg/dL (ref 70–99)
Potassium: 3.8 mmol/L (ref 3.5–5.1)
Sodium: 137 mmol/L (ref 135–145)
Total Bilirubin: 1 mg/dL (ref 0.3–1.2)
Total Protein: 5.9 g/dL — ABNORMAL LOW (ref 6.5–8.1)

## 2020-06-18 LAB — CBC WITH DIFFERENTIAL/PLATELET
Abs Immature Granulocytes: 0.06 10*3/uL (ref 0.00–0.07)
Basophils Absolute: 0 10*3/uL (ref 0.0–0.1)
Basophils Relative: 0 %
Eosinophils Absolute: 0.1 10*3/uL (ref 0.0–0.5)
Eosinophils Relative: 2 %
HCT: 33.1 % — ABNORMAL LOW (ref 36.0–46.0)
Hemoglobin: 10.5 g/dL — ABNORMAL LOW (ref 12.0–15.0)
Immature Granulocytes: 1 %
Lymphocytes Relative: 9 %
Lymphs Abs: 0.7 10*3/uL (ref 0.7–4.0)
MCH: 29 pg (ref 26.0–34.0)
MCHC: 31.7 g/dL (ref 30.0–36.0)
MCV: 91.4 fL (ref 80.0–100.0)
Monocytes Absolute: 0.6 10*3/uL (ref 0.1–1.0)
Monocytes Relative: 8 %
Neutro Abs: 6.2 10*3/uL (ref 1.7–7.7)
Neutrophils Relative %: 80 %
Platelets: 155 10*3/uL (ref 150–400)
RBC: 3.62 MIL/uL — ABNORMAL LOW (ref 3.87–5.11)
RDW: 18.2 % — ABNORMAL HIGH (ref 11.5–15.5)
WBC: 7.7 10*3/uL (ref 4.0–10.5)
nRBC: 0 % (ref 0.0–0.2)

## 2020-06-18 LAB — GLUCOSE, CAPILLARY: Glucose-Capillary: 79 mg/dL (ref 70–99)

## 2020-06-18 LAB — PHOSPHORUS: Phosphorus: 3.3 mg/dL (ref 2.5–4.6)

## 2020-06-18 LAB — MAGNESIUM: Magnesium: 1.6 mg/dL — ABNORMAL LOW (ref 1.7–2.4)

## 2020-06-18 LAB — MRSA PCR SCREENING: MRSA by PCR: NEGATIVE

## 2020-06-18 SURGERY — PPM GENERATOR CHANGEOUT

## 2020-06-18 MED ORDER — SODIUM CHLORIDE 0.9% FLUSH
3.0000 mL | Freq: Two times a day (BID) | INTRAVENOUS | Status: DC
  Administered 2020-06-18 – 2020-06-19 (×2): 3 mL via INTRAVENOUS

## 2020-06-18 MED ORDER — BUTALBITAL-APAP-CAFFEINE 50-325-40 MG PO TABS
1.0000 | ORAL_TABLET | ORAL | Status: DC | PRN
  Administered 2020-06-18 (×2): 1 via ORAL
  Filled 2020-06-18 (×2): qty 1

## 2020-06-18 MED ORDER — CALCIUM CARBONATE ANTACID 500 MG PO CHEW
1.0000 | CHEWABLE_TABLET | ORAL | Status: AC
  Administered 2020-06-18: 200 mg via ORAL
  Filled 2020-06-18: qty 1

## 2020-06-18 MED ORDER — METOCLOPRAMIDE HCL 5 MG/ML IJ SOLN
5.0000 mg | Freq: Once | INTRAMUSCULAR | Status: AC | PRN
  Administered 2020-06-18: 5 mg via INTRAVENOUS
  Filled 2020-06-18: qty 2

## 2020-06-18 MED ORDER — LIDOCAINE HCL (PF) 1 % IJ SOLN
INTRAMUSCULAR | Status: DC | PRN
  Administered 2020-06-18: 60 mL

## 2020-06-18 MED ORDER — SODIUM CHLORIDE 0.9 % IV SOLN: 250.0000 mL | INTRAVENOUS | Status: DC | PRN

## 2020-06-18 MED ORDER — SODIUM CHLORIDE 0.9% FLUSH: 3.0000 mL | INTRAVENOUS | Status: DC | PRN

## 2020-06-18 MED ORDER — ONDANSETRON HCL 4 MG/2ML IJ SOLN: 4.0000 mg | Freq: Four times a day (QID) | INTRAMUSCULAR | Status: DC | PRN

## 2020-06-18 MED ORDER — KETOROLAC TROMETHAMINE 15 MG/ML IJ SOLN
7.5000 mg | Freq: Once | INTRAMUSCULAR | Status: AC | PRN
  Administered 2020-06-18: 7.5 mg via INTRAVENOUS
  Filled 2020-06-18: qty 1

## 2020-06-18 MED ORDER — MAGNESIUM SULFATE 2 GM/50ML IV SOLN
2.0000 g | Freq: Once | INTRAVENOUS | Status: AC
  Administered 2020-06-18: 2 g via INTRAVENOUS
  Filled 2020-06-18: qty 50

## 2020-06-18 MED ORDER — VANCOMYCIN HCL 1000 MG/200ML IV SOLN
1000.0000 mg | Freq: Two times a day (BID) | INTRAVENOUS | Status: AC
  Administered 2020-06-19: 1000 mg via INTRAVENOUS
  Filled 2020-06-18: qty 200

## 2020-06-18 MED ORDER — DIPHENHYDRAMINE HCL 50 MG/ML IJ SOLN
12.5000 mg | Freq: Once | INTRAMUSCULAR | Status: AC | PRN
  Administered 2020-06-18: 12.5 mg via INTRAVENOUS
  Filled 2020-06-18: qty 1

## 2020-06-18 SURGICAL SUPPLY — 8 items
CABLE SURGICAL S-101-97-12 (CABLE) ×2 IMPLANT
PACEMAKER PRCT MRI CRTP W1TR01 (Pacemaker) ×1 IMPLANT
PAD PRO RADIOLUCENT 2001M-C (PAD) ×2 IMPLANT
PIN PLUG IS-1 DEFIB (PIN) ×1 IMPLANT
POUCH AIGIS-R ANTIBACT PPM (Mesh General) ×2 IMPLANT
POUCH AIGIS-R ANTIBACT PPM MED (Mesh General) IMPLANT
PPM PRECEPTA MRI CRT-P W1TR01 (Pacemaker) ×2 IMPLANT
TRAY PACEMAKER INSERTION (PACKS) ×2 IMPLANT

## 2020-06-18 NOTE — Progress Notes (Signed)
Remote pacemaker transmission.   

## 2020-06-18 NOTE — Progress Notes (Signed)
PROGRESS NOTE    Kimberly [name]  GYB:[id number] DOB: [birth year] DOA: 06/16/2020 PCP: Curlene Labrum, MD  Brief Narrative:  The patient is a 87 year old pleasant Caucasian female with a past medical history significant for but not limited to atrial fibrillation on anticoagulation with Eliquis as well as status post AV node ablation and biventricular pacemaker placement, CKD stage IIIb, history of combined chronic systolic and diastolic CHF, depression, history of GERD and erosive esophagitis, history of glaucoma, hypertension, osteoarthritis as well as other comorbidities including bipolar affective disorder who presented with a chief complaint of pain with palpitations, shortness of breath on exertion and headache.  Patient also states that she started having developing some diarrhea..  She called EMS due to her chest pain and palpitations as well as dyspnea and states that she feels her pacemaker is been continually firing for the last several days.  She had one episode of chest pain earlier with associated shortness of breath and continues to have significant dyspnea on exertion which is chronic but feels that she is worse in the last few days.  Patient also reports having a headache across her head which is not usual for her.  She denies any other complaints including slurred speech, drooping face, vision changes or numbness.  She also denies any cough, nausea, vomiting abdominal pain but then did admit to having some diarrhea.  She is currently scheduled to have her pacemaker battery replaced on 06/18/2020 and she lives at home and uses a walker to ambulate and usually is very independent.  In the ED she presented for her chest pain and palpitations as well as dyspnea and initial troponin was 21 with a baseline of 7.  Chest x-ray was negative for any pulmonary edema or any acute cardiopulmonary process.  **Interim History  She states her chest pain has resolved but has a headache and diarrhea has  resolved now.  She is for a permanent pacemaker changed and cardiology following and gave her IV Lasix yesterday.  Continues to have dyspnea on exertion.  PT OT test to evaluate and treat.  Assessment & Plan:   Principal Problem:   Chest pain Active Problems:   Permanent atrial fibrillation   Biventricular cardiac pacemaker - Medtronic Consulta   Essential hypertension   CKD (chronic kidney disease) stage 3, GFR 30-59 ml/min (HCC)   Chronic anticoagulation   Chronic diastolic heart failure (HCC)   Hypokalemia   Dyspnea  Chest Pain r/o ACS -Patient placed on cardiac telemetry for observation for chest pain.  -Obtain serial troponin levels and flat and Negative.  -Consult Cardiology for further evaluation and Recommendations -Continue with Eliquis which Ms. Trumbo takes chronically.Monitor blood pressure.  -Nitroglycerin as needed. Supplemental oxygen as needed to maintain O2 sat between 92-96% -Cardiology consulted and she had no chest pain today but continues to have dyspnea on exertion  Essential Hypertension -Continue home dose of Carvedilol 6.25 mg pO BID.  -Continue to Monitor BP per protocol -C/w Lasix 20 mg po Daily   CKD (chronic kidney disease) stage 3b, GFR 87-68 ml/min  Metabolic Acidosis, mild -Stable -Around baseline -BUN/Cr on Admission was 28/1.42 and has now gone to 27/1.40 -> 30/1.49 -Now CO2 is 21, AG is 12, and Chloride Level is 104 -Avoid Nephrotoxic Medications, Contrast Dyes, Hypotension and Renally adjust medications -Repeat CMP in the AM   Acute on Chronic Combined Systolic and Diastolic Heart Failure  -Appeared Euvolemic despite BNP being 486.3 but has Dyspnea on Exertion -C/w Home Lasix of  20 mg daily but Cardiology evaluated and gave IV Lasix 20 mg IV  -They are recommending continuing Carvedilol as well -Cardiology Consulted for further evaluation and recommendations -Strict I's and O's and Daily Weights; Patient is -860 mL -Cardiology  consulted for further evaluation and recommendations; Patient may need more lasix but will defer to Cardiology -Further evaluation by cardiology  Dyspnea on Exertion, worsening  -Continue montior on telemetry. Supplemental oxygen as needed. -SpO2: 99 % O2 Flow Rate (L/min): 2 L/min -C/w Lasix and received IV Lasix 20 mg x1 yesterday  -COVID and Influenza A/B Negative  -CXR this AM on Admission showed "No acute cardiopulmonary disease. No change in the left anterior chest wall biventricular cardioverter-defibrillator. Mild cardiomegaly and aortic atherosclerosis." -Cardiology consulted and may need repeat ECHO -Will need PT/OT to evaluate and Ambulatory Home O2 Screen prior to D/C   Diarrhea -Given Loperamide and now resolved  Normocytic Anemia -Patient's Hgb/Hct is 11.0/33.1 -> 10.9/33.9 -> 10.5/33.1 -Check Anemia Panel in the AM -Continue to Monitor for S/Sx of Bleeding; Currently no overt bleeding noted -Repeat CBC in the AM  Permanent Atrial Fibrillation -Chronic. Continue Carvedilol as above and Apixaban 2.5 mg po BID was held for pacemaker change but will be resumed post permanent pacemaker change out -C/w Telemetry   GERD and Erosive Esophagitis  -C/w Pantoprazole 40 mg po Daily  -Cardiology ordered some Tums  Depression and Hx of Bipolar Affective Disorder -C/w Sertraline 25 mg po Daily   Biventricular cardiac pacemaker - Medtronic Consulta done because of complete heart block -She is scheduled to havean pacemaker replaced on June 18, 2020  -Cardiology consulted for further evaluation and recommendations  and she is to have it exchanged today  Headache -Not amenable to acetaminophen  -We will try Fioricet  Chronic Anticoagulation -No bleeding. Eliquis held for Pacer Change   Hypokalemia -Potassium was mildly decreased at 3.2.  -Patient given oral potassium supplementation in the emergency room. Magnesium level was normal at 1.7 -Repeat is pending  this AM -Continue to Monitor and Replete as Necessary -Repeat CMP in the AM  Prolonged QT interval -Avoid medications which could further prolong QT interval  Hypomagnesemia -Patient's magnesium level was 1.6 -Replete with IV mag sulfate 2 g -Continue to monitor and replete as necessary -Repeat Mag level in a.m.  DVT prophylaxis: SCDs; Apixaban held for pacer change Code Status: FULL CODE  Family Communication: No family present at bedside  Disposition Plan: Pending further evaluation by PT OT and clearance by cardiology  Status is: Observation  The patient will require care spanning > 2 midnights and should be moved to inpatient because: Unsafe d/c plan, IV treatments appropriate due to intensity of illness or inability to take PO and Inpatient level of care appropriate due to severity of illness  Dispo: The patient is from: Home              Anticipated d/c is to: Home              Anticipated d/c date is: 2 days              Patient currently is not medically stable to d/c.   Difficult to place patient No  Consultants:   Cardiology   Procedures: Permanent pacemaker exchange  Antimicrobials:  Anti-infectives (From admission, onward)   Start     Dose/Rate Route Frequency Ordered Stop   06/18/20 0800  gentamicin (GARAMYCIN) 80 mg in sodium chloride 0.9 % 500 mL irrigation  80 mg Irrigation To Cath Lab 06/17/20 2002 06/19/20 0800   06/18/20 0800  vancomycin (VANCOREADY) IVPB 1000 mg/200 mL        1,000 mg 200 mL/hr over 60 Minutes Intravenous To Cath Lab 06/17/20 2002 06/19/20 0800        Subjective: Seen and examined at bedside and states that she is doing okay.  Had a headache that was a 10 out of 10 and states acetaminophen did not help.  Denies any chest pain now but states she gets extremely dyspneic on exertion even just taking a few steps.  Will need PT and OT to further evaluate and treat.  Cardiology following and she is to get her permanent pacemaker  changed today.  May need some more Lasix.  No other concerns or complaints at this time.  Objective: Vitals:   06/17/20 2049 06/18/20 0040 06/18/20 0500 06/18/20 1224  BP: 124/67 120/66 130/72 (!) 152/81  Pulse: 70 72 72 68  Resp: 18 18 18 16   Temp: 97.7 F (36.5 C) 98.1 F (36.7 C) 97.7 F (36.5 C) 98.3 F (36.8 C)  TempSrc: Oral Oral Oral Oral  SpO2: 100% 98% 100% 99%  Weight:      Height:        Intake/Output Summary (Last 24 hours) at 06/18/2020 1441 Last data filed at 06/18/2020 1610 Gross per 24 hour  Intake 1240 ml  Output 2100 ml  Net -860 ml   Filed Weights   06/16/20 2104 06/17/20 0357  Weight: 65.8 kg 64.4 kg   Examination: Physical Exam:  Constitutional: Thin and extremely pleasant elderly Caucasian female currently in NAD and appears calm and comfortable Eyes: Lids and conjunctivae normal, sclerae anicteric  ENMT: External Ears, Nose appear normal. Grossly normal hearing.  Neck: Appears normal, supple, no cervical masses, normal ROM, no appreciable thyromegaly; no JVD Respiratory: Diminished to auscultation bilaterally with coarse breath sounds, no wheezing, rales, rhonchi or crackles. Normal respiratory effort and patient is not tachypenic. No accessory muscle use.  Unlabored breathing and not wearing supplemental oxygen via nasal cannula Cardiovascular: Currently regular, no murmurs / rubs / gallops. S1 and S2 auscultated.  Trace extremity edema with tenderness to palpate her lower extremities Abdomen: Soft, non-tender, non-distended. Bowel sounds positive.  GU: Deferred. Musculoskeletal: No clubbing / cyanosis of digits/nails. No joint deformity upper and lower extremities.  Skin: No rashes, lesions, ulcers. No induration; Warm and dry.  Neurologic: CN 2-12 grossly intact with no focal deficits. Romberg sign and cerebellar reflexes not assessed.  Psychiatric: Normal judgment and insight. Alert and oriented x 3. Normal mood and appropriate affect.   Data  Reviewed: I have personally reviewed following labs and imaging studies  CBC: Recent Labs  Lab 06/16/20 2148 06/17/20 1200 06/18/20 1021  WBC 9.7 8.2 7.7  NEUTROABS 7.3 6.6 6.2  HGB 11.0* 10.9* 10.5*  HCT 33.1* 33.9* 33.1*  MCV 89.2 89.9 91.4  PLT 203 191 960   Basic Metabolic Panel: Recent Labs  Lab 06/16/20 2148 06/17/20 1200 06/18/20 1021  NA 135 137 137  K 3.2* 3.7 3.8  CL 101 103 104  CO2 21* 23 21*  GLUCOSE 90 85 90  BUN 28* 27* 30*  CREATININE 1.42* 1.40* 1.49*  CALCIUM 8.5* 8.5* 8.0*  MG 1.7 1.7 1.6*  PHOS  --  3.3 3.3   GFR: Estimated Creatinine Clearance: 23.9 mL/min (A) (by C-G formula based on SCr of 1.49 mg/dL (H)). Liver Function Tests: Recent Labs  Lab 06/17/20 1200 06/18/20 1021  AST 20 16  ALT 15 14  ALKPHOS 47 40  BILITOT 0.7 1.0  PROT 6.0* 5.9*  ALBUMIN 3.1* 3.0*   No results for input(s): LIPASE, AMYLASE in the last 168 hours. No results for input(s): AMMONIA in the last 168 hours. Coagulation Profile: No results for input(s): INR, PROTIME in the last 168 hours. Cardiac Enzymes: No results for input(s): CKTOTAL, CKMB, CKMBINDEX, TROPONINI in the last 168 hours. BNP (last 3 results) No results for input(s): PROBNP in the last 8760 hours. HbA1C: No results for input(s): HGBA1C in the last 72 hours. CBG: Recent Labs  Lab 06/18/20 0609  GLUCAP 79   Lipid Profile: No results for input(s): CHOL, HDL, LDLCALC, TRIG, CHOLHDL, LDLDIRECT in the last 72 hours. Thyroid Function Tests: No results for input(s): TSH, T4TOTAL, FREET4, T3FREE, THYROIDAB in the last 72 hours. Anemia Panel: No results for input(s): VITAMINB12, FOLATE, FERRITIN, TIBC, IRON, RETICCTPCT in the last 72 hours. Sepsis Labs: No results for input(s): PROCALCITON, LATICACIDVEN in the last 168 hours.  Recent Results (from the past 240 hour(s))  Resp Panel by RT-PCR (Flu A&B, Covid) Nasopharyngeal Swab     Status: None   Collection Time: 06/17/20 12:51 AM   Specimen:  Nasopharyngeal Swab; Nasopharyngeal(NP) swabs in vial transport medium  Result Value Ref Range Status   SARS Coronavirus 2 by RT PCR NEGATIVE NEGATIVE Final    Comment: (NOTE) SARS-CoV-2 target nucleic acids are NOT DETECTED.  The SARS-CoV-2 RNA is generally detectable in upper respiratory specimens during the acute phase of infection. The lowest concentration of SARS-CoV-2 viral copies this assay can detect is 138 copies/mL. A negative result does not preclude SARS-Cov-2 infection and should not be used as the sole basis for treatment or other patient management decisions. A negative result may occur with  improper specimen collection/handling, submission of specimen other than nasopharyngeal swab, presence of viral mutation(s) within the areas targeted by this assay, and inadequate number of viral copies(<138 copies/mL). A negative result must be combined with clinical observations, patient history, and epidemiological information. The expected result is Negative.  Fact Sheet for Patients:  EntrepreneurPulse.com.au  Fact Sheet for Healthcare Providers:  IncredibleEmployment.be  This test is no t yet approved or cleared by the [place] FDA and  has been authorized for detection and/or diagnosis of SARS-CoV-2 by FDA under an Emergency Use Authorization (EUA). This EUA will remain  in effect (meaning this test can be used) for the duration of the COVID-19 declaration under Section 564(b)(1) of the Act, 21 U.S.C.section 360bbb-3(b)(1), unless the authorization is terminated  or revoked sooner.       Influenza A by PCR NEGATIVE NEGATIVE Final   Influenza B by PCR NEGATIVE NEGATIVE Final    Comment: (NOTE) The Xpert Xpress SARS-CoV-2/FLU/RSV plus assay is intended as an aid in the diagnosis of influenza from Nasopharyngeal swab specimens and should not be used as a sole basis for treatment. Nasal washings and aspirates are unacceptable for  Xpert Xpress SARS-CoV-2/FLU/RSV testing.  Fact Sheet for Patients: EntrepreneurPulse.com.au  Fact Sheet for Healthcare Providers: IncredibleEmployment.be  This test is not yet approved or cleared by the [place] FDA and has been authorized for detection and/or diagnosis of SARS-CoV-2 by FDA under an Emergency Use Authorization (EUA). This EUA will remain in effect (meaning this test can be used) for the duration of the COVID-19 declaration under Section 564(b)(1) of the Act, 21 U.S.C. section 360bbb-3(b)(1), unless the authorization is terminated or revoked.  Performed at [facility]  Lab, [address] [place], [place] [address]   MRSA PCR Screening     Status: None   Collection Time: 06/18/20  6:31 AM   Specimen: Nasopharyngeal  Result Value Ref Range Status   MRSA by PCR NEGATIVE NEGATIVE Final    Comment:        The GeneXpert MRSA Assay (FDA approved for NASAL specimens only), is one component of a comprehensive MRSA colonization surveillance program. It is not intended to diagnose MRSA infection nor to guide or monitor treatment for MRSA infections. Performed at [facility], [place] [address], [place], [place] [address]      RN Pressure Injury Documentation:     Estimated body mass index is 22.24 kg/m as calculated from the following:   Height as of this encounter: 5\' 7"  (1.702 m).   Weight as of this encounter: 64.4 kg.  Malnutrition Type:   Malnutrition Characteristics:   Nutrition Interventions:     Radiology Studies: DG Chest Portable 1 View  Result Date: 06/16/2020 CLINICAL DATA:  Pacemaker problem. Patient reports the pacemaker is not working. EXAM: PORTABLE CHEST 1 VIEW COMPARISON:  06/14/2020 FINDINGS: Left anterior chest wall biventricular cardioverter-defibrillator is stable in appearance, leads unchanged, from the prior exam. Cardiac silhouette is mildly enlarged. Diffuse aortic atherosclerotic  calcifications. No mediastinal or hilar masses. Lungs are hyperexpanded. Left lung base not well visualized due to the superimposed heart and the semi-erect AP technique. Stables scarring at the apices including pleural base calcification. Lungs otherwise clear. No convincing pleural effusion and no pneumothorax. Skeletal structures are demineralized but grossly intact. IMPRESSION: 1. No acute cardiopulmonary disease. 2. No change in the left anterior chest wall biventricular cardioverter-defibrillator. 3. Mild cardiomegaly and aortic atherosclerosis. Electronically Signed   By: Lajean Manes M.D.   On: 06/16/2020 22:48   Scheduled Meds: . carvedilol  6.25 mg Oral BID WC  . furosemide  20 mg Oral Daily  . gentamicin irrigation  80 mg Irrigation To Cath  . pantoprazole  40 mg Oral Daily  . sertraline  25 mg Oral Daily   Continuous Infusions: . sodium chloride 50 mL/hr at 06/17/20 2226  . vancomycin      LOS: 0 days   Kerney Elbe, DO [facility] PAGER is on [place]  If 7PM-7AM, please contact night-coverage [email or web address]

## 2020-06-18 NOTE — Progress Notes (Signed)
PT Cancellation Note  Patient Details Name: Kimberly [name] MRN: [id number] DOB: 1926-06-15   Cancelled Treatment:    Reason Eval/Treat Not Completed: Patient at procedure or test/unavailable. Patient leaving unit to have battery in pacemaker replaced. Will re-attempt later or tomorrow.    Taleen Prosser 06/18/2020, 2:08 PM

## 2020-06-18 NOTE — Interval H&P Note (Signed)
History and Physical Interval Note:  06/18/2020 2:42 PM  Kimberly [name]  has presented today for surgery, with the diagnosis of ERI.  The various methods of treatment have been discussed with the patient and family. After consideration of risks, benefits and other options for treatment, the patient has consented to  Procedure(s): PPM GENERATOR CHANGEOUT (N/A) as a surgical intervention.  The patient's history has been reviewed, patient examined, no change in status, stable for surgery.  I have reviewed the patient's chart and labs.  Questions were answered to the patient's satisfaction.     Dayne Chait

## 2020-06-18 NOTE — Addendum Note (Signed)
Addended by: Cheri Kearns A on: 06/18/2020 08:11 AM   Modules accepted: Level of Service

## 2020-06-18 NOTE — Op Note (Signed)
Procedure report  Procedure performed:  1. Biventricular pacemaker (CRT-P) generator changeout  2. Aegis pouch Reason for procedure:  1. Device generator at elective replacement interval  2. Complete heart block Procedure performed by:  Sanda Klein, MD  Complications:  None  Estimated blood loss:  <5 mL  Medications administered during procedure:  Vancomycin 1 g intravenously, lidocaine 1% 30 mL locally Device details:   [employer] model number [phone], serial number [id number] Right atrial port pinned Right ventricular lead (chronic)  Medtronic , serial number [id number] (implanted 03/28/1996) Left ventricular lead  Medtronic, serial number [id number] (implanted 01/02/2006)   Explanted generator Medtronic Consulta ( implanted 2012)  Procedure details:  After the risks and benefits of the procedure were discussed the patient provided informed consent. She was brought to the cardiac catheter lab in the fasting state. The patient was prepped and draped in usual sterile fashion. Local anesthesia with 1% lidocaine was administered to to the left infraclavicular area. A 5-6cm horizontal incision was made parallel with and 2-3 cm caudal to the left clavicle, in the area of an old scar. Using minimal electrocautery and mostly  blunt dissection the prepectoral pocket was opened carefully to avoid injury to the loops of chronic leads. Extensive dissection was necessary to remove most of the TyRx pouch placed at the previous generator change. The device was explanted. The pocket was carefully inspected for hemostasis and flushed with copious amounts of antibiotic solution.  The leads were disconnected from the old generator and connected to the new generator, with appropriate pacing noted. Testing of the lead parameters via telemetry showed unchanged values. The atrial port was pinned. The pacemaker was placed in a presoaked Aegis pouch.  The entire system was then  carefully inserted in the pocket with care been taking that the leads and device assumed a comfortable position without pressure on the incision. The capped abandoned ICD lead was positioned deep to the generator. The pocket was then closed in layers using 2 layers of 2-0 Vicryl, one layer of 3-0 Vicryl, cutaneous steristrips after which a sterile dressing was applied.   At the end of the procedure the following lead parameters were encountered:   Right ventricular lead sensed R waves  NONE (dependent at 30 bpm), impedance 480 ohms, threshold 0.5 at 0.4 ms pulse width (RV tip to can).  Left ventricular lead impedance 830 ohms, threshold 1.75 at 1.0 ms pulse width.   Sanda Klein, MD, [facility] [phone] office [phone] pager

## 2020-06-18 NOTE — Progress Notes (Signed)
Pt transported to cath lab.  

## 2020-06-18 NOTE — TOC Initial Note (Addendum)
Transition of Care [facility]) - Initial/Assessment Note    Patient Details  Name: Kimberly [name] MRN: [id number] Date of Birth: 05-Jun-1926  Transition of Care [facility]) CM/SW Contact:    Marilu Favre, RN Phone Number: 06/18/2020, 12:33 PM  Clinical Narrative:                 Patient from home alone. Confirmed face sheet information. She has someone who does her shopping for her. Her daughter is a full time "RVER" and currently in [place]. Patient has a walker and bedside commode at home already. She does not have home oxygen.  Will continue to follow for PT recommendations and home oxygen needs.  Tommi Rumps with [facility] cab accept for HHPT   Patient will need transport home at discharge  Expected Discharge Plan: [place]     Patient Goals and CMS Choice Patient states their goals for this hospitalization and ongoing recovery are:: to return to home at discharge CMS Medicare.gov Compare Post Acute Care list provided to:: Patient    Expected Discharge Plan and Services Expected Discharge Plan: [place]   Discharge Planning Services: CM Consult   Living arrangements for the past 2 months: Single Family Home                                      Prior Living Arrangements/Services Living arrangements for the past 2 months: Single Family Home Lives with:: Self Patient language and need for interpreter reviewed:: Yes Do you feel safe going back to the place where you live?: Yes          Current home services: DME Criminal Activity/Legal Involvement Pertinent to Current Situation/Hospitalization: No - Comment as needed  Activities of Daily Living Home Assistive Devices/Equipment: Gilford Rile (specify type) ADL Screening (condition at time of admission) Patient's cognitive ability adequate to safely complete daily activities?: Yes Is the patient deaf or have difficulty hearing?: No Does the patient have difficulty seeing, even when  wearing glasses/contacts?: No Does the patient have difficulty concentrating, remembering, or making decisions?: No Patient able to express need for assistance with ADLs?: Yes Does the patient have difficulty dressing or bathing?: Yes Independently performs ADLs?: Yes (appropriate for developmental age) Does the patient have difficulty walking or climbing stairs?: Yes Weakness of Legs: Both Weakness of Arms/Hands: Both  Permission Sought/Granted   Permission granted to share information with : No              Emotional Assessment Appearance:: Appears stated age Attitude/Demeanor/Rapport: Engaged Affect (typically observed): Accepting Orientation: : Oriented to Self,Oriented to Place,Oriented to  Time,Oriented to Situation Alcohol / Substance Use: Not Applicable Psych Involvement: No (comment)  Admission diagnosis:  Palpitations [R00.2] Chest pain [R07.9] Chest pain, unspecified type [R07.9] Patient Active Problem List   Diagnosis Date Noted  . Dyspnea 06/17/2020  . Chest pain 06/17/2020  . Right sided abdominal pain 01/24/2020  . Finger dislocation, initial encounter 11/16/2018  . Hand trauma, left, initial encounter 11/16/2018  . Acute blood loss anemia 10/25/2018  . Rectal bleeding 10/23/2018  . Acute GI bleeding   . Enteritis of small intestine due to enterotoxigenic Escherichia coli associated with diarrhea 03/24/2017  . Bipolar affective disorder ([place]) 03/23/2017  . Olfactory hallucinations 03/23/2017  . Lower GI bleed   . Glaucoma 02/28/2017  . Pacemaker 03/21/2015  . Non-ischemic cardiomyopathy ([place]) 11/28/2014  .  PUD (peptic ulcer disease) 06/14/2014  . History of bipolar disorder   . Hypokalemia 05/26/2014  . Fall at home 05/26/2014  . Chronic diastolic heart failure ([place]) 11/15/2013  . Complete heart block ([place]) 11/15/2013  . Inability to ambulate due to ankle or foot 09/05/2013  . Unspecified constipation 08/26/2013  . Anemia 08/25/2013  . Acute  diverticulitis 08/24/2013  . CKD (chronic kidney disease) stage 3, GFR 30-59 ml/min (HCC) 10/12/2012  . Chronic anticoagulation 10/12/2012  . Osteoarthritis   . Permanent atrial fibrillation   . Biventricular cardiac pacemaker - [employer] Consulta   . Fibromyalgia   . Essential hypertension   . Depression    PCP:  Curlene Labrum, MD Pharmacy:   [facility], [name] [address] Phone: [phone] Fax: [facility], [address], [address] [place], [address] [place] [address] Phone: [phone] Fax: [phone]     Social Determinants of Health (SDOH) Interventions    Readmission Risk Interventions Readmission Risk Prevention Plan 10/15/2018  Transportation Screening Complete  PCP or Specialist Appt within 3-5 Days Complete  HRI or [place] Complete  Social Work Consult for [place] Planning/Counseling Complete  Palliative Care Screening Not Applicable  Medication Review Press photographer) Complete  Some recent data might be hidden

## 2020-06-18 NOTE — Progress Notes (Signed)
[name] from [facility] called to find out if pt would be d/c today. Vicenta Dunning that as of right now we don't have orders to d/c today. [name] asked for [place] to give her a call at [phone] when we get d/c orders for the pt so she can see if social services can arrange pick up for pt. [name] states if they are not able to pick pt up we can arrange for ambulance transport but she would like to be called first to help make arrangements for pt's d/c.

## 2020-06-18 NOTE — Progress Notes (Signed)
Pt has an order to Check Pulse Oximetry while ambulating. Pt declined at this time. Pt states " I've had a long day, can we wait and do it tomorrow".

## 2020-06-18 NOTE — Progress Notes (Addendum)
Progress Note  Patient Name: Kimberly [name] Date of Encounter: 06/18/2020  [facility] Cardiologist: Sanda Klein, MD   Subjective   Currently with chest burning - she took her pills but they did not go down well.  She has this at home daily and takes tums and it resolves   Inpatient Medications    Scheduled Meds: . carvedilol  6.25 mg Oral BID WC  . furosemide  20 mg Oral Daily  . gentamicin irrigation  80 mg Irrigation To Cath  . pantoprazole  40 mg Oral Daily  . sertraline  25 mg Oral Daily   Continuous Infusions: . sodium chloride 50 mL/hr at 06/17/20 2226  . vancomycin     PRN Meds: acetaminophen, alum & mag hydroxide-simeth, butalbital-acetaminophen-caffeine, loperamide, melatonin   Vital Signs    Vitals:   06/17/20 1658 06/17/20 2049 06/18/20 0040 06/18/20 0500  BP: (!) 139/94 124/67 120/66 130/72  Pulse: 75 70 72 72  Resp: 18 18 18 18   Temp: 98 F (36.7 C) 97.7 F (36.5 C) 98.1 F (36.7 C) 97.7 F (36.5 C)  TempSrc: Oral Oral Oral Oral  SpO2: 95% 100% 98% 100%  Weight:      Height:        Intake/Output Summary (Last 24 hours) at 06/18/2020 1129 Last data filed at 06/18/2020 0603 Gross per 24 hour  Intake 1240 ml  Output 2100 ml  Net -860 ml   Last 3 Weights 06/17/2020 06/16/2020 02/27/2020  Weight (lbs) 141 lb 15.6 oz 145 lb 143 lb 6.4 oz  Weight (kg) 64.4 kg 65.772 kg 65.046 kg      Telemetry    pacing - Personally Reviewed  ECG    No new - Personally Reviewed  Physical Exam   GEN: No acute distress.   Neck: No JVD Cardiac: RRR, no murmurs, rubs, or gallops.  Respiratory: Clear rhonchi to auscultation bilaterally. GI: Soft, nontender, non-distended  MS: No edema; No deformity. Neuro:  Nonfocal  Psych: Normal affect   Labs    High Sensitivity Troponin:   Recent Labs  Lab 06/16/20 2148 06/16/20 2322 06/17/20 0453 06/17/20 0608  TROPONINIHS 21* 20* 17 16      Chemistry Recent Labs  Lab 06/16/20 2148 06/17/20 1200   NA 135 137  K 3.2* 3.7  CL 101 103  CO2 21* 23  GLUCOSE 90 85  BUN 28* 27*  CREATININE 1.42* 1.40*  CALCIUM 8.5* 8.5*  PROT  --  6.0*  ALBUMIN  --  3.1*  AST  --  20  ALT  --  15  ALKPHOS  --  47  BILITOT  --  0.7  GFRNONAA 35* 36*  ANIONGAP 13 11     Hematology Recent Labs  Lab 06/16/20 2148 06/17/20 1200 06/18/20 1021  WBC 9.7 8.2 7.7  RBC 3.71* 3.77* 3.62*  HGB 11.0* 10.9* 10.5*  HCT 33.1* 33.9* 33.1*  MCV 89.2 89.9 91.4  MCH 29.6 28.9 29.0  MCHC 33.2 32.2 31.7  RDW 18.5* 18.3* 18.2*  PLT 203 191 155    BNP Recent Labs  Lab 06/16/20 2148  BNP 486.3*     DDimer No results for input(s): DDIMER in the last 168 hours.   Radiology    DG Chest Portable 1 View  Result Date: 06/16/2020 CLINICAL DATA:  Pacemaker problem. Patient reports the pacemaker is not working. EXAM: PORTABLE CHEST 1 VIEW COMPARISON:  06/14/2020 FINDINGS: Left anterior chest wall biventricular cardioverter-defibrillator is stable in appearance, leads unchanged,  from the prior exam. Cardiac silhouette is mildly enlarged. Diffuse aortic atherosclerotic calcifications. No mediastinal or hilar masses. Lungs are hyperexpanded. Left lung base not well visualized due to the superimposed heart and the semi-erect AP technique. Stables scarring at the apices including pleural base calcification. Lungs otherwise clear. No convincing pleural effusion and no pneumothorax. Skeletal structures are demineralized but grossly intact. IMPRESSION: 1. No acute cardiopulmonary disease. 2. No change in the left anterior chest wall biventricular cardioverter-defibrillator. 3. Mild cardiomegaly and aortic atherosclerosis. Electronically Signed   By: Lajean Manes M.D.   On: 06/16/2020 22:48    Cardiac Studies   Echo 11/2019 IMPRESSIONS    1. Left ventricular ejection fraction, by estimation, is 55 to 60%. The  left ventricle has normal function. The left ventricle has no regional  wall motion abnormalities. Left  ventricular diastolic function could not  be evaluated.  2. Right ventricular systolic function is normal. The right ventricular  size is normal. There is mildly elevated pulmonary artery systolic  pressure. The estimated right ventricular systolic pressure is 46.6 mmHg.  3. Left atrial size was severely dilated.  4. Right atrial size was severely dilated.  5. The mitral valve is degenerative. Mild mitral valve regurgitation. No  evidence of mitral stenosis.  6. The aortic valve is tricuspid. Aortic valve regurgitation is not  visualized. Mild to moderate aortic valve sclerosis/calcification is  present, without any evidence of aortic stenosis.  7. The inferior vena cava is normal in size with greater than 50%  respiratory variability, suggesting right atrial pressure of 3 mmHg.   Comparison(s): No significant change from prior study. Prior EF 60-65%  (2016).   FINDINGS  Left Ventricle: Left ventricular ejection fraction, by estimation, is 55  to 60%. The left ventricle has normal function. The left ventricle has no  regional wall motion abnormalities. The left ventricular internal cavity  size was normal in size. There is  no left ventricular hypertrophy. Left ventricular diastolic function  could not be evaluated due to atrial fibrillation. Left ventricular  diastolic function could not be evaluated.   Right Ventricle: The right ventricular size is normal. No increase in  right ventricular wall thickness. Right ventricular systolic function is  normal. There is mildly elevated pulmonary artery systolic pressure. The  tricuspid regurgitant velocity is 2.94  m/s, and with an assumed right atrial pressure of 3 mmHg, the estimated  right ventricular systolic pressure is 59.9 mmHg.   Left Atrium: Left atrial size was severely dilated.   Right Atrium: Right atrial size was severely dilated.   Pericardium: Trivial pericardial effusion is present.   Mitral Valve: The mitral  valve is degenerative in appearance. Mild mitral  annular calcification. Mild mitral valve regurgitation. No evidence of  mitral valve stenosis.   Tricuspid Valve: The tricuspid valve is grossly normal. Tricuspid valve  regurgitation is mild . No evidence of tricuspid stenosis.   Aortic Valve: The aortic valve is tricuspid. . There is moderate  thickening and moderate calcification of the aortic valve. Aortic valve  regurgitation is not visualized. Mild to moderate aortic valve  sclerosis/calcification is present, without any  evidence of aortic stenosis. There is moderate thickening of the aortic  valve. There is moderate calcification of the aortic valve.   Pulmonic Valve: The pulmonic valve was grossly normal. Pulmonic valve  regurgitation is trivial. No evidence of pulmonic stenosis.   Aorta: The aortic root and ascending aorta are structurally normal, with  no evidence of dilitation.  Patient Profile     88 y.o. female with a PMH of chronic combined CHF with recovery of EF, permanent atrial fibrillation s/p AV node ablation and secondary CHB s/p PPM (CRT-P device) with device at ERI as of 05/2020, HTN, HLD, RA, bipolar disorder, and CKD stage 3 now admitted with   Assessment & Plan    Acute on chronic combined HF and recovery of EF.   --CXR without acute findings. BNP 480s, given IV lasix X 1 and was neg 860 yesterday --no weights  --now on lasix 20 po daily and BB --Cr 1.40 yesterday today's pending  CHB/s/p PPM and PPM dependent following AV nodal ablation.  Device reached ERI 05/2020 and for gen change out today   Indigestion after taking meds- she does not believe she had enough water describes as chest burning - has at home and takes tums and it resolves. I ordered tums   Permanent atrial fib - with AV nodal ablation on eliquis held for GI bleed for 1-2 months, may be able to resume post PPM change out  Non obstructive CAD -no significant chest pain per pt no acute EKG  changes  --NST in 2010 was non-ischemic and remote cath in 2007 showed mild LAD/LCx stenosis --troponin 20 to 16   HTN    --elevated on admit but improved --BP 130/72         For questions or updates, please contact [facility] Please consult [email or web address] for contact info under        Signed, Cecilie Kicks, NP  06/18/2020, 11:29 AM    Patient seen and examined with Cecilie Kicks NP.  Agree as above, with the following exceptions and changes as noted below. Tums has completely relieved her chest pain and she is back to baseline. Sitting comfortably in bedside chair. Gen: NAD, CV: RRR, no murmurs, Lungs: clear, Abd: soft, Extrem: Warm, well perfused, no edema, Neuro/Psych: alert and oriented x 3, normal mood and affect. All available labs, radiology testing, previous records reviewed.   Received 1 dose of lasix and is back on her home dose. Consider using higher home dose given GFR, however this can be discussed further at follow up. Cr mildly increased but overall stable. For generator change today. She is NPO.  Elouise Munroe, MD 06/18/20 1:04 PM

## 2020-06-19 ENCOUNTER — Encounter (HOSPITAL_COMMUNITY): Payer: Self-pay | Admitting: Cardiovascular Disease

## 2020-06-19 ENCOUNTER — Telehealth: Payer: Self-pay | Admitting: Licensed Clinical Social Worker

## 2020-06-19 DIAGNOSIS — I4821 Permanent atrial fibrillation: Secondary | ICD-10-CM | POA: Diagnosis not present

## 2020-06-19 DIAGNOSIS — R0600 Dyspnea, unspecified: Secondary | ICD-10-CM | POA: Diagnosis not present

## 2020-06-19 DIAGNOSIS — E876 Hypokalemia: Secondary | ICD-10-CM | POA: Diagnosis not present

## 2020-06-19 DIAGNOSIS — Z45010 Encounter for checking and testing of cardiac pacemaker pulse generator [battery]: Secondary | ICD-10-CM | POA: Diagnosis not present

## 2020-06-19 DIAGNOSIS — N1832 Chronic kidney disease, stage 3b: Secondary | ICD-10-CM | POA: Diagnosis not present

## 2020-06-19 DIAGNOSIS — Z7901 Long term (current) use of anticoagulants: Secondary | ICD-10-CM | POA: Diagnosis not present

## 2020-06-19 DIAGNOSIS — I5043 Acute on chronic combined systolic (congestive) and diastolic (congestive) heart failure: Secondary | ICD-10-CM | POA: Diagnosis not present

## 2020-06-19 DIAGNOSIS — I10 Essential (primary) hypertension: Secondary | ICD-10-CM | POA: Diagnosis not present

## 2020-06-19 DIAGNOSIS — I5032 Chronic diastolic (congestive) heart failure: Secondary | ICD-10-CM | POA: Diagnosis not present

## 2020-06-19 DIAGNOSIS — Z950 Presence of cardiac pacemaker: Secondary | ICD-10-CM | POA: Diagnosis not present

## 2020-06-19 DIAGNOSIS — R079 Chest pain, unspecified: Secondary | ICD-10-CM | POA: Diagnosis not present

## 2020-06-19 LAB — COMPREHENSIVE METABOLIC PANEL
ALT: 13 U/L (ref 0–44)
AST: 15 U/L (ref 15–41)
Albumin: 2.7 g/dL — ABNORMAL LOW (ref 3.5–5.0)
Alkaline Phosphatase: 41 U/L (ref 38–126)
Anion gap: 10 (ref 5–15)
BUN: 32 mg/dL — ABNORMAL HIGH (ref 8–23)
CO2: 24 mmol/L (ref 22–32)
Calcium: 8.1 mg/dL — ABNORMAL LOW (ref 8.9–10.3)
Chloride: 102 mmol/L (ref 98–111)
Creatinine, Ser: 1.96 mg/dL — ABNORMAL HIGH (ref 0.44–1.00)
GFR, Estimated: 24 mL/min — ABNORMAL LOW (ref >60)
Glucose, Bld: 105 mg/dL — ABNORMAL HIGH (ref 70–99)
Potassium: 4 mmol/L (ref 3.5–5.1)
Sodium: 136 mmol/L (ref 135–145)
Total Bilirubin: 1.1 mg/dL (ref 0.3–1.2)
Total Protein: 5.5 g/dL — ABNORMAL LOW (ref 6.5–8.1)

## 2020-06-19 LAB — CBC WITH DIFFERENTIAL/PLATELET
Abs Immature Granulocytes: 0.12 10*3/uL — ABNORMAL HIGH (ref 0.00–0.07)
Basophils Absolute: 0 10*3/uL (ref 0.0–0.1)
Basophils Relative: 0 %
Eosinophils Absolute: 0.1 10*3/uL (ref 0.0–0.5)
Eosinophils Relative: 2 %
HCT: 29.8 % — ABNORMAL LOW (ref 36.0–46.0)
Hemoglobin: 10.2 g/dL — ABNORMAL LOW (ref 12.0–15.0)
Immature Granulocytes: 2 %
Lymphocytes Relative: 10 %
Lymphs Abs: 0.8 10*3/uL (ref 0.7–4.0)
MCH: 30.4 pg (ref 26.0–34.0)
MCHC: 34.2 g/dL (ref 30.0–36.0)
MCV: 88.7 fL (ref 80.0–100.0)
Monocytes Absolute: 0.7 10*3/uL (ref 0.1–1.0)
Monocytes Relative: 8 %
Neutro Abs: 6.4 10*3/uL (ref 1.7–7.7)
Neutrophils Relative %: 78 %
Platelets: 137 10*3/uL — ABNORMAL LOW (ref 150–400)
RBC: 3.36 MIL/uL — ABNORMAL LOW (ref 3.87–5.11)
RDW: 18 % — ABNORMAL HIGH (ref 11.5–15.5)
WBC: 8.2 10*3/uL (ref 4.0–10.5)
nRBC: 0 % (ref 0.0–0.2)

## 2020-06-19 LAB — PHOSPHORUS: Phosphorus: 3.5 mg/dL (ref 2.5–4.6)

## 2020-06-19 LAB — MAGNESIUM: Magnesium: 2.2 mg/dL (ref 1.7–2.4)

## 2020-06-19 LAB — GLUCOSE, CAPILLARY: Glucose-Capillary: 105 mg/dL — ABNORMAL HIGH (ref 70–99)

## 2020-06-19 MED ORDER — SENNOSIDES-DOCUSATE SODIUM 8.6-50 MG PO TABS
1.0000 | ORAL_TABLET | Freq: Two times a day (BID) | ORAL | Status: DC
  Administered 2020-06-19: 1 via ORAL
  Filled 2020-06-19: qty 1

## 2020-06-19 MED ORDER — SENNOSIDES-DOCUSATE SODIUM 8.6-50 MG PO TABS: 1.0000 | ORAL_TABLET | Freq: Every evening | ORAL | 0 refills | Status: DC | PRN

## 2020-06-19 MED ORDER — POLYETHYLENE GLYCOL 3350 17 G PO PACK
17.0000 g | PACK | Freq: Two times a day (BID) | ORAL | Status: DC
  Administered 2020-06-19: 17 g via ORAL
  Filled 2020-06-19: qty 1

## 2020-06-19 MED ORDER — POLYETHYLENE GLYCOL 3350 17 G PO PACK: 17.0000 g | PACK | Freq: Every day | ORAL | 0 refills | Status: DC

## 2020-06-19 MED ORDER — BISACODYL 10 MG RE SUPP
10.0000 mg | Freq: Every day | RECTAL | Status: DC | PRN
  Administered 2020-06-19: 10 mg via RECTAL
  Filled 2020-06-19: qty 1

## 2020-06-19 NOTE — Discharge Summary (Signed)
Physician Discharge Summary  KRISSA UTKE UXL:[id number] DOB: 18-Mar-1927 DOA: 06/16/2020  PCP: Curlene Labrum, MD  Admit date: 06/16/2020 Discharge date: 06/19/2020  Admitted From: Home Disposition: Home with Home Health   Recommendations for Outpatient Follow-up:  1. Follow up with PCP in 1-2 weeks 2. Follow up with Cardiology within 1-2 weeks 3. Please obtain CMP/CBC,Mag, Phos in one week 4. Repeat CXR in 3-6 weeks 5. Please follow up on the following pending results:  Home Health: YES  Equipment/Devices: None    Discharge Condition: Stable CODE STATUS: FULL CODE Diet recommendation: Heart Healthy Diet  Brief/Interim Summary: The patient is a 87 year old pleasant Caucasian female with a past medical history significant for but not limited to atrial fibrillation on anticoagulation with Eliquis as well as status post AV node ablation and biventricular pacemaker placement, CKD stage IIIb, history of combined chronic systolic and diastolic CHF, depression, history of GERD and erosive esophagitis, history of glaucoma, hypertension, osteoarthritis as well as other comorbidities including bipolar affective disorder who presented with a chief complaint of pain with palpitations, shortness of breath on exertion and headache. Patient also states that she started having developing some diarrhea.. She called EMS due to her chest pain and palpitations as well as dyspnea and states that she feels her pacemaker is been continually firing for the last several days. She had one episode of chest pain earlier with associated shortness of breath and continues to have significant dyspnea on exertion which is chronic but feels that she is worse in the last few days. Patient also reports having a headache across her head which is not usual for her. She denies any other complaints including slurred speech, drooping face, vision changes or numbness.She also denies any cough, nausea, vomiting abdominal pain  but then did admit to having some diarrhea. She is currently scheduled to have her pacemaker battery replaced on 06/18/2020 and she lives at home and uses a walker to ambulate and usually is very independent. In the ED she presented for her chest pain and palpitations as well as dyspnea and initial troponin was 21 with a baseline of 7. Chest x-ray was negative for any pulmonary edema or any acute cardiopulmonary process.  **Interim History  She states her chest pain has resolved but has a headache and diarrhea has resolved now.  She is for a permanent pacemaker changed and cardiology following and gave her IV Lasix the day before yesterday.  Continues to have dyspnea on exertion but it is chronic.  PT OT test to evaluate and treat and adding home health.  She is stable to be discharged on the cardiologist changed out her pacer and after they cleared the patient.  Her creatinine went up slightly but they will repeat her lab work within 1 week.  She has no complaints and is stable to be discharged at this time.  Discharge Diagnoses:  Principal Problem:   Chest pain Active Problems:   Permanent atrial fibrillation   Biventricular cardiac pacemaker - Medtronic Consulta   Essential hypertension   CKD (chronic kidney disease) stage 3, GFR 30-59 ml/min (HCC)   Chronic anticoagulation   Chronic diastolic heart failure (HCC)   Hypokalemia   Dyspnea   Pacemaker battery depletion  ChestPainr/o ACS -Patientplacedon cardiac telemetry for observation for chest pain. -Obtain serial troponin levelsand flat and Negative. -Consult Cardiology for further evaluation and Recommendations -Continue with Eliquis which Ms. Jaffee takes chronically.Monitor blood pressure. -Nitroglycerin as needed. Supplemental oxygen as needed to maintain O2 sat  between 92-96% -Cardiology consulted and she had no chest pain today but continues to have dyspnea on exertion  EssentialHypertension -Continue home dose  ofCarvedilol 6.25 mg pO BID. -Continue to Monitor BP per protocol -C/w Lasix 20 mg po Daily -Last BP was 114/78  AKI on CKD (chronic kidney disease) stage 3b, GFR 16-96 ml/min Metabolic Acidosis, mild, and improved -Stable -Around baseline -BUN/Cr on Admission was 28/1.42 and has now gone to 27/1.40 -> 30/1.49 -> 32/1.96 -Now CO2 is 24, AG is 10, and Chloride Level is 102 -Avoid Nephrotoxic Medications, Contrast Dyes, Hypotension and Renally adjust medications -Repeat CMP within 1 week  Acute on ChronicCombined Systolic and DiastolicHeartFailure -Appeared Euvolemic despite BNP being 486.3 but has Dyspnea on Exertion -C/w Home Lasix of 20 mg daily but Cardiology evaluated and gave IV Lasix 20 mg IV  -They are recommending continuing Carvedilol as well -Cardiology Consulted for further evaluation and recommendations -Strict I's and O's and Daily Weights; Patient is -1.374 L -Cardiology consulted for further evaluation and recommendations; Patient may need more lasix but will defer to Cardiology -Further evaluation by cardiology I feel she is euvolemic and recommending discharging home on p.o. Lasix with repeat labs within 1 week  Dyspneaon Exertion, Chronic  -Continuemontior on telemetry. Supplemental oxygen as needed. -SpO2: 99 % O2 Flow Rate (L/min): 2 L/min -C/w Lasix and received IV Lasix 20 mg x1 the day before yesterday  -COVID and Influenza A/B Negative  -CXR this AM on Admission showed "No acute cardiopulmonary disease. No change in the left anterior chest wall biventricular cardioverter-defibrillator. Mild cardiomegaly and aortic atherosclerosis." -Cardiology consulted and may need repeat ECHO but can be done as an outpatient -Follow up with Cardiology within 1 week  -Will need PT/OT to evaluate and Ambulatory Home O2 Screen prior to D/C and DID NOT desaturate   Diarrhea -Given Loperamide and now resolved  Normocytic Anemia -Patient's Hgb/Hct is 11.0/33.1  -> 10.9/33.9 -> 10.5/33.1 -> 10.2/29.8 -Check Anemia Panel as an outpatient  -Continue to Monitor for S/Sx of Bleeding; Currently no overt bleeding noted -Repeat CBC within 1 week   PermanentAtrialFibrillation -Chronic. ContinueCarvedilol as above and Apixaban 2.5 mg po BID was held for pacemaker change but will be resumed post permanent pacemaker change out -C/w Telemetry   GERD and Erosive Esophagitis  -C/w Pantoprazole 40 mg po Daily  -Cardiology ordered some Tums  Depression and Hx of Bipolar Affective Disorder -C/w Sertraline 25 mg po Daily  Biventricular cardiac pacemaker - Medtronic Consulta done because of complete heart block -She is scheduled to havean pacemaker replaced on June 18, 2020 -Cardiology consulted for further evaluation and recommendations and she is to have it exchanged today  Headache -Not amenable to acetaminophen  -We will try Fioricet and improved   ChronicAnticoagulation -No bleeding. Eliquis held for Pacer Change   Hypokalemia -Potassium was mildly decreased at 3.2 and improved to 4.0 -Patient given oral potassium supplementation in the emergency room. Magnesium level was normalat 2.2 -Continue to Monitor and Replete as Necessary -Repeat CMP as an outpatient   Prolonged QT interval -Avoid medications which could further prolong QT interval  Hypomagnesemia -Patient's magnesium level was 1.6 and improved to 2.2 -Continue to monitor and replete as necessary -Repeat Mag level in a.m.  Discharge Instructions  Discharge Instructions    Call MD for:  difficulty breathing, headache or visual disturbances   Complete by: As directed    Call MD for:  extreme fatigue   Complete by: As directed    Call  MD for:  hives   Complete by: As directed    Call MD for:  persistant dizziness or light-headedness   Complete by: As directed    Call MD for:  persistant nausea and vomiting   Complete by: As directed    Call MD for:   redness, tenderness, or signs of infection (pain, swelling, redness, odor or green/yellow discharge around incision site)   Complete by: As directed    Call MD for:  severe uncontrolled pain   Complete by: As directed    Call MD for:  temperature >100.4   Complete by: As directed    Diet - low sodium heart healthy   Complete by: As directed    Discharge instructions   Complete by: As directed    You were cared for by a hospitalist during your hospital stay. If you have any questions about your discharge medications or the care you received while you were in the hospital after you are discharged, you can call the unit and ask to speak with the hospitalist on call if the hospitalist that took care of you is not available. Once you are discharged, your primary care physician will handle any further medical issues. Please note that NO REFILLS for any discharge medications will be authorized once you are discharged, as it is imperative that you return to your primary care physician (or establish a relationship with a primary care physician if you do not have one) for your aftercare needs so that they can reassess your need for medications and monitor your lab values.  Follow up with PCP and Cardiology within 1-2 weeks.  Take all medications as prescribed. If symptoms change or worsen please return to the ED for evaluation   Increase activity slowly   Complete by: As directed      Allergies as of 06/19/2020      Reactions   Ciprofloxacin Other (See Comments)   Possibly caused diarrhea November 2018   Flagyl [metronidazole] Other (See Comments)   Possibly caused diarrhea November 2018   Papaya Derivatives Hives   Iodine Rash, Other (See Comments)   REACTION:If injected,  Rash/irritated skin reaction "welts"   Penicillins Hives   DID THE REACTION INVOLVE: Swelling of the face/tongue/throat, SOB, or low BP? Unknown Sudden or severe rash/hives, skin peeling, or the inside of the mouth or nose?  Yes Did it require medical treatment? Yes When did it last happen?40 or 87 years old If all above answers are "NO", may proceed with cephalosporin use.   Sulfa Antibiotics Rash      Medication List    STOP taking these medications   predniSONE 20 MG tablet Commonly known as: DELTASONE     TAKE these medications   acetaminophen 325 MG tablet Commonly known as: TYLENOL Take 2 tablets (650 mg total) by mouth every 6 (six) hours as needed for mild pain, fever or headache (or Fever >/= 101). What changed:   how much to take  reasons to take this   apixaban 2.5 MG Tabs tablet Commonly known as: Eliquis Take 1 tablet (2.5 mg total) by mouth 2 (two) times daily.   Blink Tears 0.25 % Soln Generic drug: Polyethylene Glycol 400 Place 1 drop into both eyes daily as needed (for dry eye relief).   CALCIUM CARBONATE ANTACID PO Take 1 tablet by mouth daily as needed for heartburn or indigestion.   carvedilol 6.25 MG tablet Commonly known as: COREG TAKE 1 TABLET BY MOUTH  TWICE DAILY WITH  A MEAL What changed:   how much to take  how to take this  when to take this  additional instructions   colchicine 0.6 MG tablet Take 0.6 mg by mouth daily. Take 1.2 mg at one time ,then take 0.6 mg one hour later as needed   furosemide 20 MG tablet Commonly known as: LASIX Take 1 tablet (20 mg total) by mouth daily.   pantoprazole 40 MG tablet Commonly known as: PROTONIX Take 40 mg by mouth daily as needed for heartburn.   polyethylene glycol 17 g packet Commonly known as: MIRALAX / GLYCOLAX Take 17 g by mouth daily.   PreserVision AREDS 2 Caps Take 1 capsule by mouth 2 (two) times daily.   senna-docusate 8.6-50 MG tablet Commonly known as: Senokot-S Take 1 tablet by mouth at bedtime as needed for mild constipation.   sertraline 25 MG tablet Commonly known as: ZOLOFT Take 25 mg by mouth daily.   Vitamin D (Ergocalciferol) 1.25 MG (50000 UNIT) Caps capsule Commonly  known as: DRISDOL Take 50,000 Units by mouth every Sunday.            Durable Medical Equipment  (From admission, onward)         Start     Ordered   06/19/20 1102  For home use only DME standard manual wheelchair with seat cushion  Once       Comments: Patient suffers from Biventricular pacemaker (CRT-P) generator changeout  which impairs their ability to perform daily activities like ambulating  in the home.  A cane  will not resolve issue with performing activities of daily living. A wheelchair will allow patient to safely perform daily activities. Patient can safely propel the wheelchair in the home or has a caregiver who can provide assistance. Length of need lifetime. Accessories: elevating leg rests (ELRs), wheel locks, extensions and anti-tippers.  Seat and back cushion   06/19/20 1102          Follow-up Information    [facility] Follow up.   Specialty: Home Health Services Contact information: [place] [address] [phone]              Allergies  Allergen Reactions  . Ciprofloxacin Other (See Comments)    Possibly caused diarrhea November 2018  . Flagyl [Metronidazole] Other (See Comments)    Possibly caused diarrhea November 2018  . Papaya Derivatives Hives  . Iodine Rash and Other (See Comments)    REACTION:If injected,  Rash/irritated skin reaction "welts"  . Penicillins Hives    DID THE REACTION INVOLVE: Swelling of the face/tongue/throat, SOB, or low BP? Unknown Sudden or severe rash/hives, skin peeling, or the inside of the mouth or nose? Yes Did it require medical treatment? Yes When did it last happen?87 or 87 years old If all above answers are "NO", may proceed with cephalosporin use.  . Sulfa Antibiotics Rash    Consultations:  Cardiology  Procedures/Studies: EP PPM/ICD IMPLANT  Result Date: 06/19/2020 1. Biventricular pacemaker (CRT-P) generator changeout 2. Aegis pouch Reason for  procedure: 1. Device generator at elective replacement interval 2. Complete heart block Procedure performed by: Sanda Klein, MD Complications: None Estimated blood loss: <5 mL Medications administered during procedure: Vancomycin 1 g intravenously, lidocaine 1% 30 mL locally Device details: Stallings number [id number], serial number [id number] Right atrial port pinned Right ventricular lead (chronic)  Medtronic , serial number [id number] (implanted 03/28/1996) Left ventricular lead  Medtronic, serial number  [id number] (implanted 01/02/2006) Explanted generator Medtronic Consulta ( implanted 2012) Procedure details: After the risks and benefits of the procedure were discussed the patient provided informed consent. She was brought to the cardiac catheter lab in the fasting state. The patient was prepped and draped in usual sterile fashion. Local anesthesia with 1% lidocaine was administered to to the left infraclavicular area. A 5-6cm horizontal incision was made parallel with and 2-3 cm caudal to the left clavicle, in the area of an old scar. Using minimal electrocautery and mostly  blunt dissection the prepectoral pocket was opened carefully to avoid injury to the loops of chronic leads. Extensive dissection was necessary to remove most of the TyRx pouch placed at the previous generator change. The device was explanted. The pocket was carefully inspected for hemostasis and flushed with copious amounts of antibiotic solution. The leads were disconnected from the old generator and connected to the new generator, with appropriate pacing noted. Testing of the lead parameters via telemetry showed unchanged values. The atrial port was pinned. The pacemaker was placed in a presoaked Aegis pouch. The entire system was then carefully inserted in the pocket with care been taking that the leads and device assumed a comfortable position without pressure on the incision. The capped abandoned ICD lead  was positioned deep to the generator. The pocket was then closed in layers using 2 layers of 2-0 Vicryl, one layer of 3-0 Vicryl, cutaneous steristrips after which a sterile dressing was applied. At the end of the procedure the following lead parameters were encountered: Right ventricular lead sensed R waves  NONE (dependent at 30 bpm), impedance 480 ohms, threshold 0.5 at 0.4 ms pulse width (RV tip to can). Left ventricular lead impedance 830 ohms, threshold 1.75 at 1.0 ms pulse width.   DG Chest Portable 1 View  Result Date: 06/16/2020 CLINICAL DATA:  Pacemaker problem. Patient reports the pacemaker is not working. EXAM: PORTABLE CHEST 1 VIEW COMPARISON:  06/14/2020 FINDINGS: Left anterior chest wall biventricular cardioverter-defibrillator is stable in appearance, leads unchanged, from the prior exam. Cardiac silhouette is mildly enlarged. Diffuse aortic atherosclerotic calcifications. No mediastinal or hilar masses. Lungs are hyperexpanded. Left lung base not well visualized due to the superimposed heart and the semi-erect AP technique. Stables scarring at the apices including pleural base calcification. Lungs otherwise clear. No convincing pleural effusion and no pneumothorax. Skeletal structures are demineralized but grossly intact. IMPRESSION: 1. No acute cardiopulmonary disease. 2. No change in the left anterior chest wall biventricular cardioverter-defibrillator. 3. Mild cardiomegaly and aortic atherosclerosis. Electronically Signed   By: Lajean Manes M.D.   On: 06/16/2020 22:48   CUP PACEART REMOTE DEVICE CHECK  Result Date: 06/13/2020 Scheduled remote reviewed. Normal device function.  The device has reached the elective replacement indicator Next remote to be determined. Kathy Breach, RN, CCDS, [employer]    Subjective: Seen and examined at bedside and she is doing okay and felt much better.  Still has dyspnea on exertion but this is chronic.  Had no nausea or vomiting.  Headache is  improved.  Diarrhea is improved.  He is stable to be discharged home and she will follow up with PCP as well cardiology in outpatient setting and she understands agrees with the plan of care.   Discharge Exam: Vitals:   06/19/20 0822 06/19/20 1159  BP: 139/65 114/78  Pulse: 60 (!) 59  Resp: 16 16  Temp: (!) 97.5 F (36.4 C) (!) 97.2 F (36.2 C)  SpO2: 100% 100%  Vitals:   06/18/20 2100 06/19/20 0019 06/19/20 0822 06/19/20 1159  BP: (!) 103/53 (!) 108/51 139/65 114/78  Pulse: 60 60 60 (!) 59  Resp: 16 16 16 16   Temp: 98 F (36.7 C) (!) 97.5 F (36.4 C) (!) 97.5 F (36.4 C) (!) 97.2 F (36.2 C)  TempSrc: Oral Oral Oral Oral  SpO2: 100% 100% 100% 100%  Weight:      Height:       General: Pt is alert, awake, not in acute distress Cardiovascular: Irregularly irregular, S1/S2 +, no rubs, no gallops Respiratory: Diminished bilaterally, no wheezing, no rhonchi Abdominal: Soft, NT, ND, bowel sounds + Extremities: Minimal edema, no cyanosis  The results of significant diagnostics from this hospitalization (including imaging, microbiology, ancillary and laboratory) are listed below for reference.    Microbiology: Recent Results (from the past 240 hour(s))  Resp Panel by RT-PCR (Flu A&B, Covid) Nasopharyngeal Swab     Status: None   Collection Time: 06/17/20 12:51 AM   Specimen: Nasopharyngeal Swab; Nasopharyngeal(NP) swabs in vial transport medium  Result Value Ref Range Status   SARS Coronavirus 2 by RT PCR NEGATIVE NEGATIVE Final    Comment: (NOTE) SARS-CoV-2 target nucleic acids are NOT DETECTED.  The SARS-CoV-2 RNA is generally detectable in upper respiratory specimens during the acute phase of infection. The lowest concentration of SARS-CoV-2 viral copies this assay can detect is 138 copies/mL. A negative result does not preclude SARS-Cov-2 infection and should not be used as the sole basis for treatment or other patient management decisions. A negative result may occur  with  improper specimen collection/handling, submission of specimen other than nasopharyngeal swab, presence of viral mutation(s) within the areas targeted by this assay, and inadequate number of viral copies(<138 copies/mL). A negative result must be combined with clinical observations, patient history, and epidemiological information. The expected result is Negative.  Fact Sheet for Patients:  EntrepreneurPulse.com.au  Fact Sheet for Healthcare Providers:  IncredibleEmployment.be  This test is no t yet approved or cleared by the [place] FDA and  has been authorized for detection and/or diagnosis of SARS-CoV-2 by FDA under an Emergency Use Authorization (EUA). This EUA will remain  in effect (meaning this test can be used) for the duration of the COVID-19 declaration under Section 564(b)(1) of the Act, 21 U.S.C.section 360bbb-3(b)(1), unless the authorization is terminated  or revoked sooner.       Influenza A by PCR NEGATIVE NEGATIVE Final   Influenza B by PCR NEGATIVE NEGATIVE Final    Comment: (NOTE) The Xpert Xpress SARS-CoV-2/FLU/RSV plus assay is intended as an aid in the diagnosis of influenza from Nasopharyngeal swab specimens and should not be used as a sole basis for treatment. Nasal washings and aspirates are unacceptable for Xpert Xpress SARS-CoV-2/FLU/RSV testing.  Fact Sheet for Patients: EntrepreneurPulse.com.au  Fact Sheet for Healthcare Providers: IncredibleEmployment.be  This test is not yet approved or cleared by the [place] FDA and has been authorized for detection and/or diagnosis of SARS-CoV-2 by FDA under an Emergency Use Authorization (EUA). This EUA will remain in effect (meaning this test can be used) for the duration of the COVID-19 declaration under Section 564(b)(1) of the Act, 21 U.S.C. section 360bbb-3(b)(1), unless the authorization is terminated  or revoked.  Performed at [facility], [place] [address], [place], [place] [address]   MRSA PCR Screening     Status: None   Collection Time: 06/18/20  6:31 AM   Specimen: Nasopharyngeal  Result Value Ref Range  Status   MRSA by PCR NEGATIVE NEGATIVE Final    Comment:        The GeneXpert MRSA Assay (FDA approved for NASAL specimens only), is one component of a comprehensive MRSA colonization surveillance program. It is not intended to diagnose MRSA infection nor to guide or monitor treatment for MRSA infections. Performed at [facility], [place] [address], [place], [place] [address]     Labs: BNP (last 3 results) Recent Labs    10/02/19 1724 02/03/20 1109 06/16/20 2148  BNP 232.0* 293.7* 297.9*   Basic Metabolic Panel: Recent Labs  Lab 06/16/20 2148 06/17/20 1200 06/18/20 1021 06/19/20 0202  NA 135 137 137 136  K 3.2* 3.7 3.8 4.0  CL 101 103 104 102  CO2 21* 23 21* 24  GLUCOSE 90 85 90 105*  BUN 28* 27* 30* 32*  CREATININE 1.42* 1.40* 1.49* 1.96*  CALCIUM 8.5* 8.5* 8.0* 8.1*  MG 1.7 1.7 1.6* 2.2  PHOS  --  3.3 3.3 3.5   Liver Function Tests: Recent Labs  Lab 06/17/20 1200 06/18/20 1021 06/19/20 0202  AST 20 16 15   ALT 15 14 13   ALKPHOS 47 40 41  BILITOT 0.7 1.0 1.1  PROT 6.0* 5.9* 5.5*  ALBUMIN 3.1* 3.0* 2.7*   No results for input(s): LIPASE, AMYLASE in the last 168 hours. No results for input(s): AMMONIA in the last 168 hours. CBC: Recent Labs  Lab 06/16/20 2148 06/17/20 1200 06/18/20 1021 06/19/20 0202  WBC 9.7 8.2 7.7 8.2  NEUTROABS 7.3 6.6 6.2 6.4  HGB 11.0* 10.9* 10.5* 10.2*  HCT 33.1* 33.9* 33.1* 29.8*  MCV 89.2 89.9 91.4 88.7  PLT 203 191 155 137*   Cardiac Enzymes: No results for input(s): CKTOTAL, CKMB, CKMBINDEX, TROPONINI in the last 168 hours. BNP: Invalid input(s): POCBNP CBG: Recent Labs  Lab 06/18/20 0609 06/19/20 0613  GLUCAP 79 105*   D-Dimer No results for input(s): DDIMER in the last 72 hours. Hgb  A1c No results for input(s): HGBA1C in the last 72 hours. Lipid Profile No results for input(s): CHOL, HDL, LDLCALC, TRIG, CHOLHDL, LDLDIRECT in the last 72 hours. Thyroid function studies No results for input(s): TSH, T4TOTAL, T3FREE, THYROIDAB in the last 72 hours.  Invalid input(s): FREET3 Anemia work up No results for input(s): VITAMINB12, FOLATE, FERRITIN, TIBC, IRON, RETICCTPCT in the last 72 hours. Urinalysis    Component Value Date/Time   COLORURINE STRAW (A) 10/09/2019 [place] 10/09/2019 1744   LABSPEC 1.012 10/09/2019 1744   PHURINE 6.0 10/09/2019 1744   GLUCOSEU NEGATIVE 10/09/2019 1744   HGBUR NEGATIVE 10/09/2019 [place] 10/09/2019 1744   BILIRUBINUR neg 04/25/2014 0855   [place] 10/09/2019 1744   PROTEINUR NEGATIVE 10/09/2019 1744   UROBILINOGEN 0.2 05/26/2014 1415   NITRITE NEGATIVE 10/09/2019 1744   LEUKOCYTESUR TRACE (A) 10/09/2019 1744   Sepsis Labs Invalid input(s): PROCALCITONIN,  WBC,  LACTICIDVEN Microbiology Recent Results (from the past 240 hour(s))  Resp Panel by RT-PCR (Flu A&B, Covid) Nasopharyngeal Swab     Status: None   Collection Time: 06/17/20 12:51 AM   Specimen: Nasopharyngeal Swab; Nasopharyngeal(NP) swabs in vial transport medium  Result Value Ref Range Status   SARS Coronavirus 2 by RT PCR NEGATIVE NEGATIVE Final    Comment: (NOTE) SARS-CoV-2 target nucleic acids are NOT DETECTED.  The SARS-CoV-2 RNA is generally detectable in upper respiratory specimens during the acute phase of infection. The lowest concentration of SARS-CoV-2 viral copies this  assay can detect is 138 copies/mL. A negative result does not preclude SARS-Cov-2 infection and should not be used as the sole basis for treatment or other patient management decisions. A negative result may occur with  improper specimen collection/handling, submission of specimen other than nasopharyngeal swab, presence of viral mutation(s)  within the areas targeted by this assay, and inadequate number of viral copies(<138 copies/mL). A negative result must be combined with clinical observations, patient history, and epidemiological information. The expected result is Negative.  Fact Sheet for Patients:  EntrepreneurPulse.com.au  Fact Sheet for Healthcare Providers:  IncredibleEmployment.be  This test is no t yet approved or cleared by the [place] FDA and  has been authorized for detection and/or diagnosis of SARS-CoV-2 by FDA under an Emergency Use Authorization (EUA). This EUA will remain  in effect (meaning this test can be used) for the duration of the COVID-19 declaration under Section 564(b)(1) of the Act, 21 U.S.C.section 360bbb-3(b)(1), unless the authorization is terminated  or revoked sooner.       Influenza A by PCR NEGATIVE NEGATIVE Final   Influenza B by PCR NEGATIVE NEGATIVE Final    Comment: (NOTE) The Xpert Xpress SARS-CoV-2/FLU/RSV plus assay is intended as an aid in the diagnosis of influenza from Nasopharyngeal swab specimens and should not be used as a sole basis for treatment. Nasal washings and aspirates are unacceptable for Xpert Xpress SARS-CoV-2/FLU/RSV testing.  Fact Sheet for Patients: EntrepreneurPulse.com.au  Fact Sheet for Healthcare Providers: IncredibleEmployment.be  This test is not yet approved or cleared by the [place] FDA and has been authorized for detection and/or diagnosis of SARS-CoV-2 by FDA under an Emergency Use Authorization (EUA). This EUA will remain in effect (meaning this test can be used) for the duration of the COVID-19 declaration under Section 564(b)(1) of the Act, 21 U.S.C. section 360bbb-3(b)(1), unless the authorization is terminated or revoked.  Performed at [facility], [place] [address], [place], [place] [address]   MRSA PCR Screening     Status: None    Collection Time: 06/18/20  6:31 AM   Specimen: Nasopharyngeal  Result Value Ref Range Status   MRSA by PCR NEGATIVE NEGATIVE Final    Comment:        The GeneXpert MRSA Assay (FDA approved for NASAL specimens only), is one component of a comprehensive MRSA colonization surveillance program. It is not intended to diagnose MRSA infection nor to guide or monitor treatment for MRSA infections. Performed at [facility], [place] [address] [place],  [address]    Time coordinating discharge: 35 minutes  SIGNED:  Kerney Elbe, DO [facility] 06/19/2020, 6:18 PM Pager is on [place]  If 7PM-7AM, please contact night-coverage [email or web address]

## 2020-06-19 NOTE — Evaluation (Signed)
Physical Therapy Evaluation Patient Details Name: Kimberly [name] MRN: [id number] DOB: 03/26/1927 Today's Date: 06/19/2020   History of Present Illness  Pt is a 87 yo female s/p chest pain and palpitations.  Pt sp Biventricular pacemaker (CRT-P) generator changeout 2/14. PMHx:CHF, Afib on chronic anticoagulation, bipolar disorder, cardiomyopathy, CKD, fibromyalgia, HTN, PPM Placement, back surgery  Clinical Impression   Patient received sitting at EOB, very pleasant and cooperative with therapy, very talkative but able to be redirected. Able to mobilize on a min guard basis with RW, however does tend to push it too far ahead of her and needed cues for safe device use, also very easily fatigued but she reports this is her recent baseline and her MD is aware. SPO2 98-99% on room air but very DOE. Mobility seems close to her recent reported baseline. Left up in recliner with all needs met, RN aware of patient status. Will benefit from [place] f/u at DC.     Follow Up Recommendations Home health PT    Equipment Recommendations  Rolling walker with 5" wheels;3in1 (PT);Wheelchair (measurements PT);Wheelchair cushion (measurements PT)    Recommendations for Other Services       Precautions / Restrictions Precautions Precautions: Fall;ICD/Pacemaker Restrictions Weight Bearing Restrictions: No      Mobility  Bed Mobility               General bed mobility comments: sitting at EOB upon entry    Transfers Overall transfer level: Needs assistance Equipment used: Rolling walker (2 wheeled) Transfers: Sit to/from Stand Sit to Stand: Min guard         General transfer comment: min guard, increased time and effort  Ambulation/Gait Ambulation/Gait assistance: Min guard Gait Distance (Feet): 10 Feet Assistive device: Rolling walker (2 wheeled) Gait Pattern/deviations: Step-through pattern;Decreased step length - right;Decreased step length - left;Trunk flexed Gait velocity:  decreased   General Gait Details: slow but steady with RW, tends to push it too far ahead of her and needed cues to correct; SPO2 no lower than 98% on room air. Easily fatigued.  Stairs            Wheelchair Mobility    Modified Rankin (Stroke Patients Only)       Balance Overall balance assessment: Needs assistance Sitting-balance support: Feet supported;No upper extremity supported Sitting balance-Leahy Scale: Good     Standing balance support: Bilateral upper extremity supported;During functional activity Standing balance-Leahy Scale: Fair Standing balance comment: reliant on BUE support                             Pertinent Vitals/Pain Pain Assessment: No/denies pain    Home Living Family/patient expects to be discharged to:: Private residence Living Arrangements: Alone Available Help at Discharge: Other (Comment) Psychiatrist) Type of Home: House Home Access: Stairs to enter Entrance Stairs-Rails: None Entrance Stairs-Number of Steps: 1 Home Layout: One level Home Equipment: Environmental consultant - 2 wheels;Bedside commode      Prior Function Level of Independence: Independent               Hand Dominance   Dominant Hand: Right    Extremity/Trunk Assessment   Upper Extremity Assessment Upper Extremity Assessment: Defer to OT evaluation    Lower Extremity Assessment Lower Extremity Assessment: Generalized weakness    Cervical / Trunk Assessment Cervical / Trunk Assessment: Kyphotic  Communication   Communication: No difficulties  Cognition Arousal/Alertness: Awake/alert Behavior During Therapy: WFL for tasks assessed/performed  Overall Cognitive Status: Within Functional Limits for tasks assessed                                 General Comments: very talkative but easily redirected, very suspicious of relatives- "they are all thieves"-, and some impaired insight into safety with RW. Suspect she is probably near her  baseline.      General Comments      Exercises     Assessment/Plan    PT Assessment Patient needs continued PT services  PT Problem List Decreased strength;Decreased knowledge of use of DME;Decreased activity tolerance;Decreased safety awareness;Decreased balance;Decreased mobility;Cardiopulmonary status limiting activity       PT Treatment Interventions DME instruction;Balance training;Gait training;Stair training;Functional mobility training;Patient/family education;Therapeutic exercise;Therapeutic activities    PT Goals (Current goals can be found in the Care Plan section)  Acute Rehab PT Goals Patient Stated Goal: go home, work on strength PT Goal Formulation: With patient Time For Goal Achievement: 07/03/20 Potential to Achieve Goals: Good    Frequency Min 3X/week   Barriers to discharge        Co-evaluation               AM-PAC PT "6 Clicks" Mobility  Outcome Measure Help needed turning from your back to your side while in a flat bed without using bedrails?: A Little Help needed moving from lying on your back to sitting on the side of a flat bed without using bedrails?: A Little Help needed moving to and from a bed to a chair (including a wheelchair)?: A Little Help needed standing up from a chair using your arms (e.g., wheelchair or bedside chair)?: A Little Help needed to walk in hospital room?: A Little Help needed climbing 3-5 steps with a railing? : A Little 6 Click Score: 18    End of Session Equipment Utilized During Treatment: Gait belt Activity Tolerance: Patient tolerated treatment well Patient left: in chair;with call bell/phone within reach Nurse Communication: Mobility status;Other (comment) (ambulatory sats) PT Visit Diagnosis: Muscle weakness (generalized) (M62.81);Unsteadiness on feet (R26.81);Other abnormalities of gait and mobility (R26.89)    Time: 2751-7001 PT Time Calculation (min) (ACUTE ONLY): 27 min   Charges:   PT  Evaluation $PT Eval Moderate Complexity: 1 Mod PT Treatments $Gait Training: 8-22 mins       Windell Norfolk, DPT, PN1   Supplemental Physical Therapist [place]    Pager [phone] Acute Rehab Office [phone]

## 2020-06-19 NOTE — Telephone Encounter (Signed)
Per chart pt appointment on 2/17 has been cancelled here at [facility] office.  I have called Transportation Services and alerted them that pt will not need previously scheduled ride.   Westley Hummer, MSW, [place]  [phone]

## 2020-06-19 NOTE — Care Management (Signed)
    Durable Medical Equipment  (From admission, onward)         Start     Ordered   06/19/20 1102  For home use only DME standard manual wheelchair with seat cushion  Once       Comments: Patient suffers from Biventricular pacemaker (CRT-P) generator changeout  which impairs their ability to perform daily activities like ambulating  in the home.  A cane  will not resolve issue with performing activities of daily living. A wheelchair will allow patient to safely perform daily activities. Patient can safely propel the wheelchair in the home or has a caregiver who can provide assistance. Length of need lifetime. Accessories: elevating leg rests (ELRs), wheel locks, extensions and anti-tippers.  Seat and back cushion   06/19/20 1102

## 2020-06-19 NOTE — Evaluation (Signed)
Occupational Therapy Evaluation Patient Details Name: Kimberly [name] MRN: [id number] DOB: 02-15-1927 Today's Date: 06/19/2020    History of Present Illness Pt is a 88 yo female s/p chest pain and palpitations.  Pt sp Biventricular pacemaker (CRT-P) generator changeout 2/14. PMHx:CHF, Afib on chronic anticoagulation, bipolar disorder, cardiomyopathy, CKD, fibromyalgia, HTN, PPM Placement, back surgery   Clinical Impression   Pt PTA: Pt living alone and family is out of town. Pt reports independence with ADL and mobility. Pt currently, modified independence with ADL and mobility with RW for mobility in room and in bathroom. Pt standing at sink x5 mins fo ADL tasks. Pt reports to be feeling much better. Pt does not have any focal deficits at this time. O2 >90% with exertion. Pt with no c/o pain. Pt does not require continued OT skilled services at this time. OT signing off. Thank you.      Follow Up Recommendations  No OT follow up    Equipment Recommendations  None recommended by OT    Recommendations for Other Services       Precautions / Restrictions Precautions Precautions: Fall;ICD/Pacemaker Restrictions Weight Bearing Restrictions: No      Mobility Bed Mobility Overal bed mobility: Modified Independent             General bed mobility comments: sitting at EOB upon entry    Transfers Overall transfer level: Needs assistance Equipment used: Rolling walker (2 wheeled) Transfers: Sit to/from Stand Sit to Stand: Min guard         General transfer comment: min guard, increased time and effort    Balance Overall balance assessment: Needs assistance Sitting-balance support: Feet supported;No upper extremity supported Sitting balance-Leahy Scale: Good     Standing balance support: Bilateral upper extremity supported;During functional activity Standing balance-Leahy Scale: Fair Standing balance comment: reliant on BUE support                            ADL either performed or assessed with clinical judgement   ADL Overall ADL's : Modified independent                                       General ADL Comments: Pt requiring no physical assist for OOB ADL nor mobility.     Vision Baseline Vision/History: Wears glasses Wears Glasses: At all times Patient Visual Report: No change from baseline Vision Assessment?: No apparent visual deficits     Perception     Praxis      Pertinent Vitals/Pain Pain Assessment: No/denies pain     Hand Dominance Right   Extremity/Trunk Assessment Upper Extremity Assessment Upper Extremity Assessment: Defer to OT evaluation   Lower Extremity Assessment Lower Extremity Assessment: Generalized weakness   Cervical / Trunk Assessment Cervical / Trunk Assessment: Kyphotic   Communication Communication Communication: No difficulties   Cognition Arousal/Alertness: Awake/alert Behavior During Therapy: WFL for tasks assessed/performed Overall Cognitive Status: Within Functional Limits for tasks assessed                                 General Comments: very talkative but easily redirected, very suspicious of relatives- "they are all thieves"-, and some impaired insight into safety with RW. Suspect she is probably near her baseline.   General Comments  O2 >90% with exertion.  Exercises     Shoulder Instructions      Home Living Family/patient expects to be discharged to:: Private residence Living Arrangements: Alone Available Help at Discharge: Other (Comment) Psychiatrist) Type of Home: House Home Access: Stairs to enter [employer] of Steps: 1 Entrance Stairs-Rails: None Home Layout: One level     Bathroom Shower/Tub: Tub only   Biochemist, clinical: Standard     Home Equipment: Environmental consultant - 2 wheels;Bedside commode          Prior Functioning/Environment Level of Independence: Independent                 OT Problem List:         OT Treatment/Interventions:      OT Goals(Current goals can be found in the care plan section) Acute Rehab OT Goals Patient Stated Goal: go home, work on strength OT Goal Formulation: All assessment and education complete, DC therapy Potential to Achieve Goals: Good  OT Frequency:     Barriers to D/C:            Co-evaluation              AM-PAC OT "6 Clicks" Daily Activity     Outcome Measure Help from another person eating meals?: None Help from another person taking care of personal grooming?: None Help from another person toileting, which includes using toliet, bedpan, or urinal?: None Help from another person bathing (including washing, rinsing, drying)?: None Help from another person to put on and taking off regular upper body clothing?: None Help from another person to put on and taking off regular lower body clothing?: None 6 Click Score: 24   End of Session Equipment Utilized During Treatment: Rolling walker;Gait belt Nurse Communication: Mobility status  Activity Tolerance: Patient tolerated treatment well Patient left: in bed;with call bell/phone within reach;with nursing/sitter in room  OT Visit Diagnosis: Unsteadiness on feet (R26.81);Muscle weakness (generalized) (M62.81)                Time: 1610-9604 OT Time Calculation (min): 25 min Charges:  OT General Charges $OT Visit: 1 Visit OT Evaluation $OT Eval Low Complexity: 1 Low OT Treatments $Self Care/Home Management : 8-22 mins  Jefferey Pica, OTR/L Acute Rehabilitation Services Pager: [phone] Office: [phone]   Baby Stairs C 06/19/2020, 10:57 AM

## 2020-06-19 NOTE — Progress Notes (Signed)
Progress Note  Patient Name: Kimberly [name] Date of Encounter: 06/19/2020  Primary Cardiologist: Sanda Klein, MD   Subjective   Overall stable symptoms, chronic dyspnea without hypoxia.  Inpatient Medications    Scheduled Meds: . carvedilol  6.25 mg Oral BID WC  . furosemide  20 mg Oral Daily  . pantoprazole  40 mg Oral Daily  . polyethylene glycol  17 g Oral BID  . senna-docusate  1 tablet Oral BID  . sertraline  25 mg Oral Daily  . sodium chloride flush  3 mL Intravenous Q12H   Continuous Infusions: . sodium chloride     PRN Meds: sodium chloride, acetaminophen, alum & mag hydroxide-simeth, bisacodyl, butalbital-acetaminophen-caffeine, melatonin, ondansetron (ZOFRAN) IV, sodium chloride flush   Vital Signs    Vitals:   06/18/20 2100 06/19/20 0019 06/19/20 0822 06/19/20 1159  BP: (!) 103/53 (!) 108/51 139/65 114/78  Pulse: 60 60 60 (!) 59  Resp: 16 16 16 16   Temp: 98 F (36.7 C) (!) 97.5 F (36.4 C) (!) 97.5 F (36.4 C) (!) 97.2 F (36.2 C)  TempSrc: Oral Oral Oral Oral  SpO2: 100% 100% 100% 100%  Weight:      Height:        Intake/Output Summary (Last 24 hours) at 06/19/2020 1323 Last data filed at 06/18/2020 1800 Gross per 24 hour  Intake 685.8 ml  Output 1200 ml  Net -514.2 ml   Filed Weights   06/16/20 2104 06/17/20 0357  Weight: 65.8 kg 64.4 kg    Telemetry    Paced- Personally Reviewed  ECG     - Personally Reviewed  Physical Exam   GEN: No acute distress.   Neck: No JVD Cardiac: regular rhythm, normal rate, no murmurs, rubs, or gallops.  Respiratory: Clear to auscultation bilaterally. GI: Soft, nontender, non-distended  MS: No edema; No deformity. Neuro:  Nonfocal  Psych: Normal affect   Labs    Chemistry Recent Labs  Lab 06/17/20 1200 06/18/20 1021 06/19/20 0202  NA 137 137 136  K 3.7 3.8 4.0  CL 103 104 102  CO2 23 21* 24  GLUCOSE 85 90 105*  BUN 27* 30* 32*  CREATININE 1.40* 1.49* 1.96*  CALCIUM 8.5* 8.0* 8.1*   PROT 6.0* 5.9* 5.5*  ALBUMIN 3.1* 3.0* 2.7*  AST 20 16 15   ALT 15 14 13   ALKPHOS 47 40 41  BILITOT 0.7 1.0 1.1  GFRNONAA 36* 33* 24*  ANIONGAP 11 12 10      Hematology Recent Labs  Lab 06/17/20 1200 06/18/20 1021 06/19/20 0202  WBC 8.2 7.7 8.2  RBC 3.77* 3.62* 3.36*  HGB 10.9* 10.5* 10.2*  HCT 33.9* 33.1* 29.8*  MCV 89.9 91.4 88.7  MCH 28.9 29.0 30.4  MCHC 32.2 31.7 34.2  RDW 18.3* 18.2* 18.0*  PLT 191 155 137*    Cardiac EnzymesNo results for input(s): TROPONINI in the last 168 hours. No results for input(s): TROPIPOC in the last 168 hours.   BNP Recent Labs  Lab 06/16/20 2148  BNP 486.3*     DDimer No results for input(s): DDIMER in the last 168 hours.   Radiology    EP PPM/ICD IMPLANT  Result Date: 06/19/2020 1. Biventricular pacemaker (CRT-P) generator changeout 2. Aegis pouch Reason for procedure: 1. Device generator at elective replacement interval 2. Complete heart block Procedure performed by: Sanda Klein, MD Complications: None Estimated blood loss: <5 mL Medications administered during procedure: Vancomycin 1 g intravenously, lidocaine 1% 30 mL locally Device details: New  Generator Medtronic Lavelle model number [id number], serial number [id number] Right atrial port pinned Right ventricular lead (chronic)  Medtronic , serial number [id number] (implanted 03/28/1996) Left ventricular lead  Medtronic, serial number [id number] (implanted 01/02/2006) Explanted generator Medtronic Consulta ( implanted 2012) Procedure details: After the risks and benefits of the procedure were discussed the patient provided informed consent. She was brought to the cardiac catheter lab in the fasting state. The patient was prepped and draped in usual sterile fashion. Local anesthesia with 1% lidocaine was administered to to the left infraclavicular area. A 5-6cm horizontal incision was made parallel with and 2-3 cm caudal to the left clavicle, in the area of an old scar. Using minimal  electrocautery and mostly  blunt dissection the prepectoral pocket was opened carefully to avoid injury to the loops of chronic leads. Extensive dissection was necessary to remove most of the TyRx pouch placed at the previous generator change. The device was explanted. The pocket was carefully inspected for hemostasis and flushed with copious amounts of antibiotic solution. The leads were disconnected from the old generator and connected to the new generator, with appropriate pacing noted. Testing of the lead parameters via telemetry showed unchanged values. The atrial port was pinned. The pacemaker was placed in a presoaked Aegis pouch. The entire system was then carefully inserted in the pocket with care been taking that the leads and device assumed a comfortable position without pressure on the incision. The capped abandoned ICD lead was positioned deep to the generator. The pocket was then closed in layers using 2 layers of 2-0 Vicryl, one layer of 3-0 Vicryl, cutaneous steristrips after which a sterile dressing was applied. At the end of the procedure the following lead parameters were encountered: Right ventricular lead sensed R waves  NONE (dependent at 30 bpm), impedance 480 ohms, threshold 0.5 at 0.4 ms pulse width (RV tip to can). Left ventricular lead impedance 830 ohms, threshold 1.75 at 1.0 ms pulse width.    Cardiac Studies     Echo 11/2019 IMPRESSIONS     1. Left ventricular ejection fraction, by estimation, is 55 to 60%. The  left ventricle has normal function. The left ventricle has no regional  wall motion abnormalities. Left ventricular diastolic function could not  be evaluated.   2. Right ventricular systolic function is normal. The right ventricular  size is normal. There is mildly elevated pulmonary artery systolic  pressure. The estimated right ventricular systolic pressure is 28.7 mmHg.   3. Left atrial size was severely dilated.   4. Right atrial size was severely dilated.    5. The mitral valve is degenerative. Mild mitral valve regurgitation. No  evidence of mitral stenosis.   6. The aortic valve is tricuspid. Aortic valve regurgitation is not  visualized. Mild to moderate aortic valve sclerosis/calcification is  present, without any evidence of aortic stenosis.   7. The inferior vena cava is normal in size with greater than 50%  respiratory variability, suggesting right atrial pressure of 3 mmHg.   Comparison(s): No significant change from prior study. Prior EF 60-65%  (2016).   FINDINGS   Left Ventricle: Left ventricular ejection fraction, by estimation, is 55  to 60%. The left ventricle has normal function. The left ventricle has no  regional wall motion abnormalities. The left ventricular internal cavity  size was normal in size. There is   no left ventricular hypertrophy. Left ventricular diastolic function  could not be evaluated due to atrial fibrillation. Left ventricular  diastolic function  could not be evaluated.   Right Ventricle: The right ventricular size is normal. No increase in  right ventricular wall thickness. Right ventricular systolic function is  normal. There is mildly elevated pulmonary artery systolic pressure. The  tricuspid regurgitant velocity is 2.94   m/s, and with an assumed right atrial pressure of 3 mmHg, the estimated  right ventricular systolic pressure is 81.8 mmHg.   Left Atrium: Left atrial size was severely dilated.   Right Atrium: Right atrial size was severely dilated.   Pericardium: Trivial pericardial effusion is present.   Mitral Valve: The mitral valve is degenerative in appearance. Mild mitral  annular calcification. Mild mitral valve regurgitation. No evidence of  mitral valve stenosis.   Tricuspid Valve: The tricuspid valve is grossly normal. Tricuspid valve  regurgitation is mild . No evidence of tricuspid stenosis.   Aortic Valve: The aortic valve is tricuspid. . There is moderate  thickening  and moderate calcification of the aortic valve. Aortic valve  regurgitation is not visualized. Mild to moderate aortic valve  sclerosis/calcification is present, without any  evidence of aortic stenosis. There is moderate thickening of the aortic  valve. There is moderate calcification of the aortic valve.   Pulmonic Valve: The pulmonic valve was grossly normal. Pulmonic valve  regurgitation is trivial. No evidence of pulmonic stenosis.   Aorta: The aortic root and ascending aorta are structurally normal, with  no evidence of dilitation.    Patient Profile     87 y.o. female with a PMH of chronic combined CHF with recovery of EF, permanent atrial fibrillation s/p AV node ablation and secondary CHB s/p PPM (CRT-P device) with device at ERI as of 05/2020, HTN, HLD, RA, bipolar disorder, and CKD stage 3 now admitted with  heart failure and device at [facility]  Assessment & Plan   Principal Problem:   Chest pain Active Problems:   Permanent atrial fibrillation   Biventricular cardiac pacemaker - Medtronic Consulta   Essential hypertension   CKD (chronic kidney disease) stage 3, GFR 30-59 ml/min (HCC)   Chronic anticoagulation   Chronic diastolic heart failure (HCC)   Hypokalemia   Dyspnea   Pacemaker battery depletion   Acute on chronic combined HF and recovery of EF.   --Cr elevated to 1.96, somewhat above baseline but she has chronic renal insufficiency.  Could continue home dose of Lasix 20 mg p.o. daily.  She has chronic dyspnea but no hypoxia with ambulation.   CHB/s/p PPM and PPM dependent following AV nodal ablation.  Device reached ERI 05/2020 and had uneventful pacemaker generator exchange yesterday.   Indigestion after taking meds- she does not believe she had enough water describes as chest burning - has at home and takes tums and it resolves.  Recommend as needed Tums.  Permanent atrial fib - with AV nodal ablation on eliquis held for GI bleed for 1-2 months, may be able to  resume post PPM change out-can start today.  Eliquis 2.5 mg twice daily.   Non obstructive CAD -no significant chest pain per pt no acute EKG changes  --NST in 2010 was non-ischemic and remote cath in 2007 showed mild LAD/LCx stenosis --troponin 20 to 16    HTN    --elevated on admit but improved   If stable from an internal medicine standpoint, no further cardiac testing or intervention required this hospital stay.  Stable for discharge from a cardiac standpoint.  Close follow-up will be arranged.   [facility] will sign off.  For questions or updates, please contact [place] Please consult [email or web address] for contact info under        Signed, Elouise Munroe, MD  06/19/2020, 1:23 PM

## 2020-06-19 NOTE — Progress Notes (Signed)
RN gave pt discharge instuctions and the patient stated understanding. IV has been removed and belongings packed

## 2020-06-19 NOTE — TOC Progression Note (Signed)
Transition of Care [facility]) - Progression Note    Patient Details  Name: Kimberly [name] MRN: [id number] Date of Birth: 02-Jan-1927  [facility] CM/SW Contact  Kimberly Lefevre Edson Snowball, RN Phone Number: 06/19/2020, 11:33 AM  Clinical Narrative:     Patient has wheel chair , walker and 3 in1 at home already. HHPT arranged with [facility]. Patient has a ride home today if she is discharged prior to 4 pm . If she is discharged today after 4 pm we can use [facility] transportation , they will not assist her inside.   If wheelchair transport needed [facility] transportation requires 24 hours notice.  Expected Discharge Plan: [name]    Expected Discharge Plan and Services Expected Discharge Plan: [name]   Discharge Planning Services: CM Consult   Living arrangements for the past 2 months: Single Family Home                                       Social Determinants of Health (SDOH) Interventions    Readmission Risk Interventions Readmission Risk Prevention Plan 10/15/2018  Transportation Screening Complete  PCP or Specialist Appt within 3-5 Days Complete  HRI or [place] Complete  Social Work Consult for [place] Planning/Counseling Complete  Palliative Care Screening Not Applicable  Medication Review Press photographer) Complete  Some recent data might be hidden

## 2020-06-20 ENCOUNTER — Telehealth: Payer: Self-pay | Admitting: Cardiovascular Disease

## 2020-06-20 DIAGNOSIS — G8929 Other chronic pain: Secondary | ICD-10-CM | POA: Diagnosis not present

## 2020-06-20 DIAGNOSIS — Z888 Allergy status to other drugs, medicaments and biological substances status: Secondary | ICD-10-CM | POA: Diagnosis not present

## 2020-06-20 DIAGNOSIS — Z881 Allergy status to other antibiotic agents status: Secondary | ICD-10-CM | POA: Diagnosis not present

## 2020-06-20 DIAGNOSIS — Z9049 Acquired absence of other specified parts of digestive tract: Secondary | ICD-10-CM | POA: Diagnosis not present

## 2020-06-20 DIAGNOSIS — I10 Essential (primary) hypertension: Secondary | ICD-10-CM | POA: Diagnosis not present

## 2020-06-20 DIAGNOSIS — W19XXXA Unspecified fall, initial encounter: Secondary | ICD-10-CM | POA: Diagnosis not present

## 2020-06-20 DIAGNOSIS — R5381 Other malaise: Secondary | ICD-10-CM | POA: Diagnosis not present

## 2020-06-20 DIAGNOSIS — Z880 Allergy status to penicillin: Secondary | ICD-10-CM | POA: Diagnosis not present

## 2020-06-20 DIAGNOSIS — R109 Unspecified abdominal pain: Secondary | ICD-10-CM | POA: Diagnosis not present

## 2020-06-20 DIAGNOSIS — R279 Unspecified lack of coordination: Secondary | ICD-10-CM | POA: Diagnosis not present

## 2020-06-20 DIAGNOSIS — Z79899 Other long term (current) drug therapy: Secondary | ICD-10-CM | POA: Diagnosis not present

## 2020-06-20 DIAGNOSIS — Z950 Presence of cardiac pacemaker: Secondary | ICD-10-CM | POA: Diagnosis not present

## 2020-06-20 DIAGNOSIS — R1084 Generalized abdominal pain: Secondary | ICD-10-CM | POA: Diagnosis not present

## 2020-06-20 DIAGNOSIS — Z882 Allergy status to sulfonamides status: Secondary | ICD-10-CM | POA: Diagnosis not present

## 2020-06-20 DIAGNOSIS — Z743 Need for continuous supervision: Secondary | ICD-10-CM | POA: Diagnosis not present

## 2020-06-20 NOTE — Telephone Encounter (Signed)
The patient has been called and made aware to keep the bandage on for two days and to avoid getting the area wet. She will also need to keep the steri strips on and let them come off by themselves.

## 2020-06-20 NOTE — Telephone Encounter (Signed)
Pt called in and would like to know when she can remove the bandage from her wound.    Best number [phone]

## 2020-06-20 NOTE — Telephone Encounter (Signed)
Patient had procedure 02/14, I am not sure on how long they suggest to keep on bandage.   Thank you!

## 2020-06-21 ENCOUNTER — Telehealth: Payer: Self-pay | Admitting: Cardiovascular Disease

## 2020-06-21 ENCOUNTER — Ambulatory Visit: Payer: Medicare HMO | Admitting: Cardiology

## 2020-06-21 MED ORDER — SENNOSIDES-DOCUSATE SODIUM 8.6-50 MG PO TABS: 1.0000 | ORAL_TABLET | Freq: Every evening | ORAL | 3 refills | Status: DC | PRN

## 2020-06-21 MED ORDER — PANTOPRAZOLE SODIUM 40 MG PO TBEC: 40.0000 mg | DELAYED_RELEASE_TABLET | Freq: Every day | ORAL | 3 refills | Status: DC | PRN

## 2020-06-21 NOTE — Telephone Encounter (Signed)
Patient wanted someone to call her to go over her medications with her. She is not sure what she can and can not take since being in the hospital. Please advise

## 2020-06-21 NOTE — Telephone Encounter (Signed)
Patient states she is experiencing pain in her lower abdomen and she requested a call back to discuss. She also requested to have a copy of her recent ICD implant mailed to her home address.

## 2020-06-21 NOTE — Telephone Encounter (Signed)
Spoke with pt who report she has been experiencing lower abdominal but have already spoken to her PCP who provided recommendations.   Pt also requesting copy of recent gen change report. Results placed in the mail.

## 2020-06-21 NOTE — Telephone Encounter (Signed)
Spoke to patient discharge medications reviewed.Patient voiced understanding.Refills for pantoprazole and senokot sent to pharmacy.Post hosp follow up scheduled with Almyra Deforest PA 2/22 at 2:15 pm.

## 2020-06-22 ENCOUNTER — Telehealth: Payer: Self-pay | Admitting: Licensed Clinical Social Worker

## 2020-06-22 DIAGNOSIS — N183 Chronic kidney disease, stage 3 unspecified: Secondary | ICD-10-CM | POA: Diagnosis not present

## 2020-06-22 DIAGNOSIS — Z950 Presence of cardiac pacemaker: Secondary | ICD-10-CM | POA: Diagnosis not present

## 2020-06-22 DIAGNOSIS — I442 Atrioventricular block, complete: Secondary | ICD-10-CM | POA: Diagnosis not present

## 2020-06-22 DIAGNOSIS — N179 Acute kidney failure, unspecified: Secondary | ICD-10-CM | POA: Diagnosis not present

## 2020-06-22 DIAGNOSIS — R079 Chest pain, unspecified: Secondary | ICD-10-CM | POA: Diagnosis not present

## 2020-06-22 DIAGNOSIS — D649 Anemia, unspecified: Secondary | ICD-10-CM | POA: Diagnosis not present

## 2020-06-22 DIAGNOSIS — N189 Chronic kidney disease, unspecified: Secondary | ICD-10-CM | POA: Diagnosis not present

## 2020-06-22 DIAGNOSIS — I5043 Acute on chronic combined systolic (congestive) and diastolic (congestive) heart failure: Secondary | ICD-10-CM | POA: Diagnosis not present

## 2020-06-22 DIAGNOSIS — M1A9XX1 Chronic gout, unspecified, with tophus (tophi): Secondary | ICD-10-CM | POA: Diagnosis not present

## 2020-06-22 NOTE — Telephone Encounter (Signed)
[name] reached out to pt to see if she is in need of transportation to her upcoming appointment on 2/22. No answer, HIPAA compliant message left for pt at [phone].   Westley Hummer, MSW, [place]  [phone]

## 2020-06-25 ENCOUNTER — Telehealth: Payer: Self-pay | Admitting: Licensed Clinical Social Worker

## 2020-06-25 NOTE — Telephone Encounter (Signed)
[name] called again regarding transportation to upcoming appt. No answer, HIPAA compliant message left again at [phone].  Westley Hummer, MSW, [place]  [phone]

## 2020-06-26 ENCOUNTER — Ambulatory Visit: Payer: Medicare HMO | Admitting: Physician Assistant

## 2020-06-26 ENCOUNTER — Telehealth: Payer: Self-pay | Admitting: Cardiovascular Disease

## 2020-06-26 ENCOUNTER — Ambulatory Visit: Payer: Medicare Other | Admitting: Gastroenterology

## 2020-06-26 NOTE — Telephone Encounter (Signed)
Spoke to patient she checked her B/P several times systolic 573,225 last reading 155.Advised to keep appointment with PCP 06/29/20.

## 2020-06-26 NOTE — Telephone Encounter (Signed)
Patient said she has had a headache for ~ 2 weeks now. She does not have any other symptoms. She wanted to know if she can take something other than Tylenol. Please advise

## 2020-06-26 NOTE — Telephone Encounter (Signed)
Spoke to patient she stated she has had a headache every day for the past 2 weeks.No other symptoms.She has appointment with PCP this Friday 2/25.Advised to keep appointment.Advised to check B/P.I will make Dr.Croitoru aware.

## 2020-06-26 NOTE — Telephone Encounter (Signed)
Pt c/o BP issue: STAT if pt c/o blurred vision, one-sided weakness or slurred speech  1. What are your last 5 BP readings?  165, 143, 155  2. Are you having any other symptoms (ex. Dizziness, headache, blurred vision, passed out)? headaches  3. What is your BP issue? Patient said she tried to take her BP and results are "bouncing everywhere"

## 2020-06-27 ENCOUNTER — Telehealth: Payer: Self-pay | Admitting: Licensed Clinical Social Worker

## 2020-06-27 NOTE — Telephone Encounter (Signed)
[name] reached out to pt to ensure she has a ride to her appt at [facility] tomorrow. She confirmed she does, took down appointment time. If something doesn't work out she is aware she can call me back today and we can try and find any alternate ride for her. I inquired if HHPT had started to come out to see her, she states "they're working on it." I have reached out to [place], International aid/development worker w/ [facility] and confirmed pt is on their referral list to begin physical therapy.   I remain available as needed moving forward.   Westley Hummer, MSW, [place]  [phone]

## 2020-06-28 ENCOUNTER — Ambulatory Visit (INDEPENDENT_AMBULATORY_CARE_PROVIDER_SITE_OTHER): Payer: Medicare HMO | Admitting: Emergency Medicine

## 2020-06-28 ENCOUNTER — Other Ambulatory Visit: Payer: Self-pay

## 2020-06-28 DIAGNOSIS — Z950 Presence of cardiac pacemaker: Secondary | ICD-10-CM

## 2020-06-28 DIAGNOSIS — I442 Atrioventricular block, complete: Secondary | ICD-10-CM

## 2020-06-28 NOTE — Patient Instructions (Signed)
No dental work until after 07/30/20. Call the office if you have any swelling, drainage, redness or bleeding from wound site. Call the office if you have a fever or chills and if you develop a fever or chills after office hours on the weekend be evaluated at an urgent care center or Emergency room.

## 2020-06-29 ENCOUNTER — Telehealth: Payer: Self-pay

## 2020-06-29 DIAGNOSIS — I5043 Acute on chronic combined systolic (congestive) and diastolic (congestive) heart failure: Secondary | ICD-10-CM | POA: Diagnosis not present

## 2020-06-29 DIAGNOSIS — I4821 Permanent atrial fibrillation: Secondary | ICD-10-CM | POA: Diagnosis not present

## 2020-06-29 DIAGNOSIS — M479 Spondylosis, unspecified: Secondary | ICD-10-CM | POA: Diagnosis not present

## 2020-06-29 DIAGNOSIS — D631 Anemia in chronic kidney disease: Secondary | ICD-10-CM | POA: Diagnosis not present

## 2020-06-29 DIAGNOSIS — N1832 Chronic kidney disease, stage 3b: Secondary | ICD-10-CM | POA: Diagnosis not present

## 2020-06-29 DIAGNOSIS — I130 Hypertensive heart and chronic kidney disease with heart failure and stage 1 through stage 4 chronic kidney disease, or unspecified chronic kidney disease: Secondary | ICD-10-CM | POA: Diagnosis not present

## 2020-06-29 DIAGNOSIS — H409 Unspecified glaucoma: Secondary | ICD-10-CM | POA: Diagnosis not present

## 2020-06-29 DIAGNOSIS — Z48812 Encounter for surgical aftercare following surgery on the circulatory system: Secondary | ICD-10-CM | POA: Diagnosis not present

## 2020-06-29 DIAGNOSIS — K2100 Gastro-esophageal reflux disease with esophagitis, without bleeding: Secondary | ICD-10-CM | POA: Diagnosis not present

## 2020-06-29 DIAGNOSIS — R69 Illness, unspecified: Secondary | ICD-10-CM | POA: Diagnosis not present

## 2020-06-29 LAB — CUP PACEART INCLINIC DEVICE CHECK
Battery Remaining Longevity: 108 mo
Battery Voltage: 3.2 V
Brady Statistic AP VP Percent: 0 %
Brady Statistic AP VS Percent: 0 %
Brady Statistic AS VP Percent: 99.13 %
Brady Statistic AS VS Percent: 0.87 %
Brady Statistic RA Percent Paced: 0 %
Brady Statistic RV Percent Paced: 99.13 %
Date Time Interrogation Session: 20220224154000
Implantable Lead Implant Date: 19971124
Implantable Lead Implant Date: 20070831
Implantable Lead Location: 753858
Implantable Lead Location: 753860
Implantable Lead Model: 4024
Implantable Lead Model: 4194
Implantable Pulse Generator Implant Date: 20220214
Lead Channel Impedance Value: 3363 Ohm
Lead Channel Impedance Value: 3363 Ohm
Lead Channel Impedance Value: 475 Ohm
Lead Channel Impedance Value: 494 Ohm
Lead Channel Impedance Value: 494 Ohm
Lead Channel Impedance Value: 551 Ohm
Lead Channel Impedance Value: 665 Ohm
Lead Channel Impedance Value: 722 Ohm
Lead Channel Impedance Value: 969 Ohm
Lead Channel Pacing Threshold Amplitude: 0.5 V
Lead Channel Pacing Threshold Amplitude: 1.25 V
Lead Channel Pacing Threshold Pulse Width: 0.4 ms
Lead Channel Pacing Threshold Pulse Width: 1 ms
Lead Channel Setting Pacing Amplitude: 2.5 V
Lead Channel Setting Pacing Amplitude: 2.5 V
Lead Channel Setting Pacing Pulse Width: 0.4 ms
Lead Channel Setting Pacing Pulse Width: 1 ms
Lead Channel Setting Sensing Sensitivity: 0.9 mV

## 2020-06-29 NOTE — Progress Notes (Signed)
CRT-P device check and wound check in clinic. steri-strips removed, wound edges approximated, no edema, redness, or drainage.Normal device function. Thresholds, sensing, impedance consistent with previous measurements. Histograms appropriate for patient and level of activity. No ventricular high rate episodes. Patient bi-ventricularly pacing 99.09% of the time. Device programmed with appropriate safety margins. Device heart failure diagnostics are within normal limits and stable over time. Estimated longevity 9 years. Patient enrolled in remote follow-up and next remote 09/18/20 . Follow-up with Dr Sallyanne Kuster 09/24/20.

## 2020-06-29 NOTE — Telephone Encounter (Signed)
Patient informed she has no lifting restrictions or arm restrictions. Encouraged to walk and exercise as tolerated.

## 2020-06-29 NOTE — Telephone Encounter (Signed)
Patient called in and wants to know if it is okay for her to exercise with her device and how much can she do.

## 2020-07-02 ENCOUNTER — Telehealth: Payer: Self-pay

## 2020-07-02 DIAGNOSIS — I4821 Permanent atrial fibrillation: Secondary | ICD-10-CM | POA: Diagnosis not present

## 2020-07-02 DIAGNOSIS — K2100 Gastro-esophageal reflux disease with esophagitis, without bleeding: Secondary | ICD-10-CM | POA: Diagnosis not present

## 2020-07-02 DIAGNOSIS — I130 Hypertensive heart and chronic kidney disease with heart failure and stage 1 through stage 4 chronic kidney disease, or unspecified chronic kidney disease: Secondary | ICD-10-CM | POA: Diagnosis not present

## 2020-07-02 DIAGNOSIS — I5043 Acute on chronic combined systolic (congestive) and diastolic (congestive) heart failure: Secondary | ICD-10-CM | POA: Diagnosis not present

## 2020-07-02 DIAGNOSIS — R69 Illness, unspecified: Secondary | ICD-10-CM | POA: Diagnosis not present

## 2020-07-02 DIAGNOSIS — H409 Unspecified glaucoma: Secondary | ICD-10-CM | POA: Diagnosis not present

## 2020-07-02 DIAGNOSIS — D631 Anemia in chronic kidney disease: Secondary | ICD-10-CM | POA: Diagnosis not present

## 2020-07-02 DIAGNOSIS — M479 Spondylosis, unspecified: Secondary | ICD-10-CM | POA: Diagnosis not present

## 2020-07-02 DIAGNOSIS — N1832 Chronic kidney disease, stage 3b: Secondary | ICD-10-CM | POA: Diagnosis not present

## 2020-07-02 DIAGNOSIS — Z48812 Encounter for surgical aftercare following surgery on the circulatory system: Secondary | ICD-10-CM | POA: Diagnosis not present

## 2020-07-02 NOTE — Telephone Encounter (Signed)
I ordered the patient a return kit for her old monitor. She should receive it in 7-10 business days.

## 2020-07-04 ENCOUNTER — Telehealth: Payer: Self-pay | Admitting: Licensed Clinical Social Worker

## 2020-07-04 NOTE — Telephone Encounter (Signed)
Pt w/ previously expressed challenges w/ Transportation to get to appointments.  I have called her and left HIPAA compliant message to ensure pt can get to appt at [facility] office tomorrow, 3/3.   Westley Hummer, MSW, [place]  [phone]

## 2020-07-05 ENCOUNTER — Encounter: Payer: Self-pay | Admitting: Cardiology

## 2020-07-05 ENCOUNTER — Other Ambulatory Visit: Payer: Self-pay

## 2020-07-05 ENCOUNTER — Telehealth: Payer: Self-pay | Admitting: Cardiology

## 2020-07-05 ENCOUNTER — Ambulatory Visit (INDEPENDENT_AMBULATORY_CARE_PROVIDER_SITE_OTHER): Payer: Medicare HMO | Admitting: Cardiology

## 2020-07-05 VITALS — BP 139/63 | HR 59 | Ht 67.0 in | Wt 136.0 lb

## 2020-07-05 DIAGNOSIS — Z79899 Other long term (current) drug therapy: Secondary | ICD-10-CM | POA: Diagnosis not present

## 2020-07-05 DIAGNOSIS — Z950 Presence of cardiac pacemaker: Secondary | ICD-10-CM | POA: Diagnosis not present

## 2020-07-05 LAB — BASIC METABOLIC PANEL
BUN/Creatinine Ratio: 13 (ref 12–28)
BUN: 19 mg/dL (ref 10–36)
CO2: 18 mmol/L — ABNORMAL LOW (ref 20–29)
Calcium: 9.2 mg/dL (ref 8.7–10.3)
Chloride: 102 mmol/L (ref 96–106)
Creatinine, Ser: 1.48 mg/dL — ABNORMAL HIGH (ref 0.57–1.00)
Glucose: 86 mg/dL (ref 65–99)
Potassium: 3.5 mmol/L (ref 3.5–5.2)
Sodium: 139 mmol/L (ref 134–144)
eGFR: 33 mL/min/{1.73_m2} — ABNORMAL LOW (ref >59)

## 2020-07-05 LAB — CBC
Hematocrit: 33.2 % — ABNORMAL LOW (ref 34.0–46.6)
Hemoglobin: 10.9 g/dL — ABNORMAL LOW (ref 11.1–15.9)
MCH: 29.4 pg (ref 26.6–33.0)
MCHC: 32.8 g/dL (ref 31.5–35.7)
MCV: 90 fL (ref 79–97)
Platelets: 209 10*3/uL (ref 150–450)
RBC: 3.71 x10E6/uL — ABNORMAL LOW (ref 3.77–5.28)
RDW: 16.2 % — ABNORMAL HIGH (ref 11.7–15.4)
WBC: 5.9 10*3/uL (ref 3.4–10.8)

## 2020-07-05 NOTE — Progress Notes (Signed)
Cardiology Office Note:    Date:  07/05/2020   ID:  Kimberly [name], DOB 1927-03-22, MRN [id number]  PCP:  Kimberly Labrum, MD  Cardiologist:  Sanda Klein, MD  Electrophysiologist:  None   Referring MD: Kimberly Labrum, MD   No chief complaint on file. Post hospital follow up  History of Present Illness:    Kimberly [name] is a delightful 87 y.o. female with a hx of nonischemic cardiomyopathy, nonobstructive coronary disease in 2007 with a low risk Myoview in 2010, bipolar disorder, chronic atrial fibrillation, complete heart block, status post PT VDP in 1997 with an upgrade in 2012, recent admission for BiV PT VDP change out.  Past Medical History:  Diagnosis Date  . Acute blood loss anemia 10/11/2012  . Acute diverticulitis 08/24/2013  . Acute on chronic combined systolic and diastolic CHF, NYHA class 4 ([place]) 11/15/2013  . Antral ulcer 10/11/2012  . Arrhythmia    atrial fibb  . Atrial fibrillation ([place])   . Bipolar affective disorder ([place]) 03/23/2017  . Cardiomyopathy, nonischemic ([place])   . Chronic anticoagulation 10/12/2012  . CKD (chronic kidney disease) stage 3, GFR 30-59 ml/min ([facility]) 10/12/2012  . Depression   . Erosive esophagitis 10/11/2012  . Fibromyalgia   . Glaucoma   . H/O echocardiogram 2007   EF 40-45%,         . Hypertension   . Osteoarthritis   . Pacemaker    Last saw cards 07/2013  . Scoliosis     Past Surgical History:  Procedure Laterality Date  . ABDOMINAL HYSTERECTOMY    . APPENDECTOMY    . BACK SURGERY    . BIOPSY  03/03/2017   Procedure: BIOPSY;  Surgeon: Danie Binder, MD;  [facility];  Service: Endoscopy;;  gastric  . BREAST SURGERY    . CARDIAC CATHETERIZATION  12/08/2005   LAD AND LEFT MAIN WITH NO HIGH-GRADE STENOSIS. MILD DISEASE IN THE CX AND LAD SYSTEM. SEVERE LV DYSFUNCTION WITH DILATION OF THE LV. EF 15-20%. LV END-DIASTOLIC PRESSURE IS 90. +1 MR.  . CHOLECYSTECTOMY    . COLONOSCOPY N/A 03/03/2017   Dr. Oneida Alar: 5 colon  polyps removed, adenomatous.  Diverticulosis.  99% of the colon was cleared but the cecum was not adequately seen.  . CYSTOSCOPY N/A 02/24/2013   Procedure: CYSTOSCOPY WITH URETHRAL DILITATION;  Surgeon: Marissa Nestle, MD;  Location: [facility];  Service: Urology;  Laterality: N/A;  . DOPPLER ECHOCARDIOGRAPHY N/A 05/30/2010   LV SIZE IS NORMAL. LV SYSTOLIC FUNCTION IS LOW NORMAL. EF=50-55%. MILD INFERIOR HYPOKINESIS.MILD TO MODERATE POSTERIOR WALL HYPOKINESIS.PACEMAKER LEAD IN THE RV. LA IS MILDLY DILATED. RA IS MODERATE TO SEVERLY DILATED. PACEMAKER LEAD IN THE RA. MILD CALCICICATION OF THE MV APPARATUS. MODERATE MR. MILD TO MODERATE TR. MILD PHTN.AV MILDLY SCLEROTIC.  [name] ESOPHAGOGASTRODUODENOSCOPY N/A 10/13/2012   Dr. Gala Romney: severe ulcerative reflux esophagitis, question of Barrett's but negative path, single deep prepyloric antral ulcer, negative H.pylori  . ESOPHAGOGASTRODUODENOSCOPY N/A 03/03/2017   Dr. Oneida Alar: Gastritis/duodenitis, no H. pylori  . HERNIA REPAIR     right inguinal hernia and umbilical  . LOWETR EXT VENOUS Bilateral 11-08-10   R & L- NO EVIDENCE OF THROMBUS OR THROMBOPHLEBITIS. THERE IS MILD AMOUNT OF SUBCUTANEOUS EDEMA NOTED WITHIN THE LEFT CALF AND ANKLE. R & L GSV AND SSV- NO VENOUS INSUFF NOTED.  [name] NECK SURGERY    . NUCLEAR STRESS TEST N/A 02/13/2009   NORMAL PATTERN OF PERFUSION IN ALL REGIONS. POST STRESS  VENTICULAR SIZE IS NORMAL. POST  STESS EF 85%.  NORMAL MYOCARDIAL PERFUSION STUDY.  [name] OPEN REDUCTION INTERNAL FIXATION (ORIF) DISTAL PHALANX Left 11/16/2018   Procedure: MIDDLE FINGER OPEN REDUCTION VERSUS RECONSTRUCTION;  Surgeon: Roseanne Kaufman, MD;  Location: [facility];  Service: Orthopedics;  Laterality: Left;  . PACEMAKER INSERTION    . POLYPECTOMY  03/03/2017   Procedure: POLYPECTOMY;  Surgeon: Danie Binder, MD;  [facility];  Service: Endoscopy;;  colon  . PPM GENERATOR CHANGEOUT N/A 06/18/2020   Procedure: PPM GENERATOR CHANGEOUT;  Surgeon: Sanda Klein, MD;  Location: [facility];  Service: Cardiovascular;  Laterality: N/A;  . TONSILLECTOMY    . YAG LASER APPLICATION Bilateral 0/02/9322   Procedure: YAG LASER APPLICATION;  Surgeon: Williams Che, MD;  Location: [facility];  Service: Ophthalmology;  Laterality: Bilateral;    Current Medications: Current Meds  Medication Sig  . acetaminophen (TYLENOL) 325 MG tablet Take 325 mg by mouth every 6 (six) hours as needed for headache.     Allergies:   Ciprofloxacin, Flagyl [metronidazole], Papaya derivatives, Iodine, Penicillins, and Sulfa antibiotics   Social History   Socioeconomic History  . Marital status: Widowed    Spouse name: Not on file  . Number of children: Not on file  . Years of education: Not on file  . Highest education level: Some college, no degree  Occupational History  . Occupation: IT trainer: RETIRED    Comment: retired  Tobacco Use  . Smoking status: Former Smoker    Packs/day: 0.50    Years: 30.00    Pack years: 15.00    Types: Cigarettes    Quit date: 12/13/2004    Years since quitting: 15.5  . Smokeless tobacco: Never Used  . Tobacco comment: former smoker  Vaping Use  . Vaping Use: Never used  Substance and Sexual Activity  . Alcohol use: Yes    Alcohol/week: 0.0 standard drinks    Comment: history of drinking a 1/2 glass of wine in the evening  . Drug use: No  . Sexual activity: Never  Other Topics Concern  . Not on file  Social History Narrative   No home exercise program. PT ordered this week.   Social Determinants of Health   Financial Resource Strain: Low Risk   . Difficulty of Paying Living Expenses: Not very hard  Food Insecurity: No Food Insecurity  . Worried About Charity fundraiser in the Last Year: Never true  . Ran Out of Food in the Last Year: Never true  Transportation Needs: Unmet Transportation Needs  . Lack of Transportation (Medical): Yes  . Lack of Transportation (Non-Medical): No   Physical Activity: Inactive  . Days of Exercise per Week: 0 days  . Minutes of Exercise per Session: 0 min  Stress: Not on file  Social Connections: Unknown  . Frequency of Communication with Friends and Family: Twice a week  . Frequency of Social Gatherings with Friends and Family: Once a week  . Attends Religious Services: Never  . Active Member of Clubs or Organizations: No  . Attends Archivist Meetings: Not on file  . Marital Status: Not on file     Family History: The patient's family history includes Asthma in her sister; Cancer in her brother and sister; Heart failure in her brother; Pulmonary embolism in her brother. There is no history of Colon cancer.  ROS:   Please see the history of present illness.  All other systems reviewed and are negative.  EKGs/Labs/Other Studies Reviewed:    The following studies were reviewed today: Echo 11/21/2019- IMPRESSIONS    1. Left ventricular ejection fraction, by estimation, is 55 to 60%. The  left ventricle has normal function. The left ventricle has no regional  wall motion abnormalities. Left ventricular diastolic function could not  be evaluated.  2. Right ventricular systolic function is normal. The right ventricular  size is normal. There is mildly elevated pulmonary artery systolic  pressure. The estimated right ventricular systolic pressure is 47.4 mmHg.  3. Left atrial size was severely dilated.  4. Right atrial size was severely dilated.  5. The mitral valve is degenerative. Mild mitral valve regurgitation. No  evidence of mitral stenosis.  6. The aortic valve is tricuspid. Aortic valve regurgitation is not  visualized. Mild to moderate aortic valve sclerosis/calcification is  present, without any evidence of aortic stenosis.  7. The inferior vena cava is normal in size with greater than 50%  respiratory variability, suggesting right atrial pressure of 3 mmHg.    Recent Labs: 06/16/2020: B  Natriuretic Peptide 486.3 06/19/2020: ALT 13; BUN 32; Creatinine, Ser 1.96; Hemoglobin 10.2; Magnesium 2.2; Platelets 137; Potassium 4.0; Sodium 136  Recent Lipid Panel    Component Value Date/Time   CHOL 179 03/15/2018 1645   TRIG 65 03/15/2018 1645   HDL 84 03/15/2018 1645   CHOLHDL 2.1 03/15/2018 1645   CHOLHDL 2.3 02/06/2009 0450   VLDL 16 02/06/2009 0450   LDLCALC 82 03/15/2018 1645    Physical Exam:    VS:  BP 139/63   Pulse (!) 59   Ht 5\' 7"  (1.702 m)   Wt 136 lb (61.7 kg)   SpO2 100%   BMI 21.30 kg/m     Wt Readings from Last 3 Encounters:  07/05/20 136 lb (61.7 kg)  06/17/20 141 lb 15.6 oz (64.4 kg)  02/27/20 143 lb 6.4 oz (65 kg)     GEN: Elderly female presents to the office using a walker., well developed in no acute distress HEENT: Normal NECK: No JVD; No carotid bruits LYMPHATICS: No lymphadenopathy CARDIAC: RRR, no murmurs, rubs, gallops-pacer site well-healed, no hematoma. RESPIRATORY: Faint basilar crackles on the right, severe kyphoscoliosis. ABDOMEN: Soft, non-tender, non-distended MUSCULOSKELETAL:  No edema; No deformity  SKIN: Warm and dry NEUROLOGIC:  Alert and oriented x 3 PSYCHIATRIC:  Normal affect   ASSESSMENT:     Pacemaker battery depletion Gen change 06/19/2020 with MDT device.    Acute on chronic combined HF and recovery of EF.  --Cr elevated to 1.96, somewhat above baseline but she has chronic renal insufficiency.    CHB/s/p PPM and PPM dependent following AV nodal ablation.   Indigestion after taking meds- she does not believe she had enough water describes as chest burning - has at home and takes tums and it resolves.  Recommend as needed Tums.  Permanent atrial fib- with AV nodal ablation on eliquis held for GI bleed for 1-2 months, may be able to resume post PPM change out-can start today.  Eliquis 2.5 mg twice daily.  Non obstructive CAD-no significant chest pain per pt no acute EKG changes  --NST in 2010 was  non-ischemic and remote cath in 2007 showed mild LAD/LCx stenosis --troponin 20 to 16   HTN  --controlled  CRI-3b - GFR 30, SCr 1.9  PLAN:   Check BMP-keep f/u as schedule   Medication Adjustments/Labs and Tests Ordered: Current medicines are reviewed at length with  the patient today.  Concerns regarding medicines are outlined above.  No orders of the defined types were placed in this encounter.  No orders of the defined types were placed in this encounter.   There are no Patient Instructions on file for this visit.   Angelena Form, PA-C  07/05/2020 9:13 AM    [facility]

## 2020-07-05 NOTE — Patient Instructions (Signed)
Medication Instructions:  Continue current medications  *If you need a refill on your cardiac medications before your next appointment, please call your pharmacy*   Lab Work: CBC and BMP  If you have labs (blood work) drawn today and your tests are completely normal, you will receive your results only by: [name] MyChart Message (if you have MyChart) OR . A paper copy in the mail If you have any lab test that is abnormal or we need to change your treatment, we will call you to review the results.   Testing/Procedures: None Ordered   Follow-Up: At [facility], you and your health needs are our priority.  As part of our continuing mission to provide you with exceptional heart care, we have created designated Provider Care Teams.  These Care Teams include your primary Cardiologist (physician) and Advanced Practice Providers (APPs -  Physician Assistants and Nurse Practitioners) who all work together to provide you with the care you need, when you need it.  We recommend signing up for the patient portal called "MyChart".  Sign up information is provided on this After Visit Summary.  MyChart is used to connect with patients for Virtual Visits (Telemedicine).  Patients are able to view lab/test results, encounter notes, upcoming appointments, etc.  Non-urgent messages can be sent to your provider as well.   To learn more about what you can do with MyChart, go to [email or web address].    Your next appointment:   Keep appointment on May 23rd  11:20 am  The format for your next appointment:   In Person  Provider:   Sanda Klein, MD

## 2020-07-05 NOTE — Telephone Encounter (Signed)
Patient called in reporting she is not feeling well this morning. Recently had a PPM gen change on 2/14 and scheduled to be seen in the office today for follow up. She c/o generalized abd pain. This has been a chronic issue and has been evaluated several times for the same and symptoms have been present for over a year. Most recently on the ED at [facility] and discharged home with recommended follow up with GI. She has no new symptoms of cough, fever, nausea or vomiting. Discussed with the patient options and advised if she is feeling well enough would recommend that she come to her office visit this morning to follow up regarding recent PPM gen change. She was agreeable to this.

## 2020-07-06 DIAGNOSIS — I5043 Acute on chronic combined systolic (congestive) and diastolic (congestive) heart failure: Secondary | ICD-10-CM | POA: Diagnosis not present

## 2020-07-06 DIAGNOSIS — I4821 Permanent atrial fibrillation: Secondary | ICD-10-CM | POA: Diagnosis not present

## 2020-07-06 DIAGNOSIS — M479 Spondylosis, unspecified: Secondary | ICD-10-CM | POA: Diagnosis not present

## 2020-07-06 DIAGNOSIS — R69 Illness, unspecified: Secondary | ICD-10-CM | POA: Diagnosis not present

## 2020-07-06 DIAGNOSIS — K2100 Gastro-esophageal reflux disease with esophagitis, without bleeding: Secondary | ICD-10-CM | POA: Diagnosis not present

## 2020-07-06 DIAGNOSIS — I130 Hypertensive heart and chronic kidney disease with heart failure and stage 1 through stage 4 chronic kidney disease, or unspecified chronic kidney disease: Secondary | ICD-10-CM | POA: Diagnosis not present

## 2020-07-06 DIAGNOSIS — H409 Unspecified glaucoma: Secondary | ICD-10-CM | POA: Diagnosis not present

## 2020-07-06 DIAGNOSIS — N1832 Chronic kidney disease, stage 3b: Secondary | ICD-10-CM | POA: Diagnosis not present

## 2020-07-06 DIAGNOSIS — Z48812 Encounter for surgical aftercare following surgery on the circulatory system: Secondary | ICD-10-CM | POA: Diagnosis not present

## 2020-07-06 DIAGNOSIS — D631 Anemia in chronic kidney disease: Secondary | ICD-10-CM | POA: Diagnosis not present

## 2020-07-09 DIAGNOSIS — R69 Illness, unspecified: Secondary | ICD-10-CM | POA: Diagnosis not present

## 2020-07-09 DIAGNOSIS — I5043 Acute on chronic combined systolic (congestive) and diastolic (congestive) heart failure: Secondary | ICD-10-CM | POA: Diagnosis not present

## 2020-07-09 DIAGNOSIS — I130 Hypertensive heart and chronic kidney disease with heart failure and stage 1 through stage 4 chronic kidney disease, or unspecified chronic kidney disease: Secondary | ICD-10-CM | POA: Diagnosis not present

## 2020-07-09 DIAGNOSIS — Z48812 Encounter for surgical aftercare following surgery on the circulatory system: Secondary | ICD-10-CM | POA: Diagnosis not present

## 2020-07-09 DIAGNOSIS — N1832 Chronic kidney disease, stage 3b: Secondary | ICD-10-CM | POA: Diagnosis not present

## 2020-07-09 DIAGNOSIS — D631 Anemia in chronic kidney disease: Secondary | ICD-10-CM | POA: Diagnosis not present

## 2020-07-09 DIAGNOSIS — K2100 Gastro-esophageal reflux disease with esophagitis, without bleeding: Secondary | ICD-10-CM | POA: Diagnosis not present

## 2020-07-09 DIAGNOSIS — H409 Unspecified glaucoma: Secondary | ICD-10-CM | POA: Diagnosis not present

## 2020-07-09 DIAGNOSIS — M479 Spondylosis, unspecified: Secondary | ICD-10-CM | POA: Diagnosis not present

## 2020-07-09 DIAGNOSIS — I4821 Permanent atrial fibrillation: Secondary | ICD-10-CM | POA: Diagnosis not present

## 2020-07-10 ENCOUNTER — Telehealth: Payer: Self-pay | Admitting: Cardiovascular Disease

## 2020-07-10 NOTE — Telephone Encounter (Signed)
Patient made an appointment on 4/11 with Dr. Sallyanne Kuster . She will follow up with PCP on 07/13/20

## 2020-07-10 NOTE — Telephone Encounter (Signed)
Pt c/o Shortness Of Breath: STAT if SOB developed within the last 24 hours or pt is noticeably SOB on the phone  1. Are you currently SOB (can you hear that pt is SOB on the phone)? Yes, cannot hear over the phone   2. How long have you been experiencing SOB? 3-4 days   3. Are you SOB when sitting or when up moving around? Both   4. Are you currently experiencing any other symptoms? No

## 2020-07-10 NOTE — Telephone Encounter (Signed)
Spoke to patient she stated for the past 3 to 4 days her breathing has been more labored.She has increase swelling in both lower legs.Weight stable. Advised to increase Lasix to 40 mg daily for the next 3 days then return to normal dose 20 mg daily.She has appointment with PCP this Fri 3/11.She would like to see Dr.Croitoru sooner.Advised I will send message to Dr. Sallyanne Kuster and his RN.

## 2020-07-10 NOTE — Telephone Encounter (Signed)
Can we bring in on one of those April days please?

## 2020-07-11 ENCOUNTER — Other Ambulatory Visit: Payer: Self-pay

## 2020-07-11 DIAGNOSIS — Z79899 Other long term (current) drug therapy: Secondary | ICD-10-CM

## 2020-07-12 DIAGNOSIS — H409 Unspecified glaucoma: Secondary | ICD-10-CM | POA: Diagnosis not present

## 2020-07-12 DIAGNOSIS — I130 Hypertensive heart and chronic kidney disease with heart failure and stage 1 through stage 4 chronic kidney disease, or unspecified chronic kidney disease: Secondary | ICD-10-CM | POA: Diagnosis not present

## 2020-07-12 DIAGNOSIS — R69 Illness, unspecified: Secondary | ICD-10-CM | POA: Diagnosis not present

## 2020-07-12 DIAGNOSIS — D631 Anemia in chronic kidney disease: Secondary | ICD-10-CM | POA: Diagnosis not present

## 2020-07-12 DIAGNOSIS — N1832 Chronic kidney disease, stage 3b: Secondary | ICD-10-CM | POA: Diagnosis not present

## 2020-07-12 DIAGNOSIS — Z48812 Encounter for surgical aftercare following surgery on the circulatory system: Secondary | ICD-10-CM | POA: Diagnosis not present

## 2020-07-12 DIAGNOSIS — I5043 Acute on chronic combined systolic (congestive) and diastolic (congestive) heart failure: Secondary | ICD-10-CM | POA: Diagnosis not present

## 2020-07-12 DIAGNOSIS — I4821 Permanent atrial fibrillation: Secondary | ICD-10-CM | POA: Diagnosis not present

## 2020-07-12 DIAGNOSIS — M479 Spondylosis, unspecified: Secondary | ICD-10-CM | POA: Diagnosis not present

## 2020-07-12 DIAGNOSIS — K2100 Gastro-esophageal reflux disease with esophagitis, without bleeding: Secondary | ICD-10-CM | POA: Diagnosis not present

## 2020-07-15 ENCOUNTER — Telehealth: Payer: Self-pay | Admitting: Student in an Organized Health Care Education/Training Program

## 2020-07-15 NOTE — Telephone Encounter (Signed)
Paged by operator that patient has had labored breathing. Attempted to contact but no answer, left VM.

## 2020-07-16 ENCOUNTER — Telehealth: Payer: Self-pay | Admitting: Cardiovascular Disease

## 2020-07-16 DIAGNOSIS — D631 Anemia in chronic kidney disease: Secondary | ICD-10-CM | POA: Diagnosis not present

## 2020-07-16 DIAGNOSIS — K2100 Gastro-esophageal reflux disease with esophagitis, without bleeding: Secondary | ICD-10-CM | POA: Diagnosis not present

## 2020-07-16 DIAGNOSIS — I5043 Acute on chronic combined systolic (congestive) and diastolic (congestive) heart failure: Secondary | ICD-10-CM | POA: Diagnosis not present

## 2020-07-16 DIAGNOSIS — H409 Unspecified glaucoma: Secondary | ICD-10-CM | POA: Diagnosis not present

## 2020-07-16 DIAGNOSIS — Z48812 Encounter for surgical aftercare following surgery on the circulatory system: Secondary | ICD-10-CM | POA: Diagnosis not present

## 2020-07-16 DIAGNOSIS — M479 Spondylosis, unspecified: Secondary | ICD-10-CM | POA: Diagnosis not present

## 2020-07-16 DIAGNOSIS — R69 Illness, unspecified: Secondary | ICD-10-CM | POA: Diagnosis not present

## 2020-07-16 DIAGNOSIS — N1832 Chronic kidney disease, stage 3b: Secondary | ICD-10-CM | POA: Diagnosis not present

## 2020-07-16 DIAGNOSIS — I130 Hypertensive heart and chronic kidney disease with heart failure and stage 1 through stage 4 chronic kidney disease, or unspecified chronic kidney disease: Secondary | ICD-10-CM | POA: Diagnosis not present

## 2020-07-16 DIAGNOSIS — I4821 Permanent atrial fibrillation: Secondary | ICD-10-CM | POA: Diagnosis not present

## 2020-07-16 MED ORDER — FUROSEMIDE 40 MG PO TABS: 40.0000 mg | ORAL_TABLET | Freq: Every day | ORAL | 2 refills | Status: DC

## 2020-07-16 NOTE — Telephone Encounter (Signed)
Spoke with pt who state she continues to experience SOB with exertion. She report after walking around her apartment, she has to sit down to catch her breath. She also report having to sleep on 2 pillows at night in order to breath better. Pt state she does not weigh herself daily but report swelling in both ankles. She state the extra 3 day dose of increase lasix helped with swelling initially but after resuming 20 mg, the swelling return. She also state breath remained the same.   Pt was scheduled to see pcp on 3/11 but report she had to reschedule because something came up.   Will route to MD for recommendations.

## 2020-07-16 NOTE — Telephone Encounter (Signed)
Note Pt c/o Shortness Of Breath: STAT if SOB developed within the last 24 hours or pt is noticeably SOB on the phone  1. Are you currently SOB (can you hear that pt is SOB on the phone)? Yes, cannot hear over the phone   2. How long have you been experiencing SOB? 3-4 days   3. Are you SOB when sitting or when up moving around? Both   4. Are you currently experiencing any other symptoms? No

## 2020-07-16 NOTE — Telephone Encounter (Signed)
I received call from scheduler from patient who wanted to know the answer of what to do. I advised of message from MD to change to 40 mg- patient will take two tablets of the 20 mg and let us know when she needs a new refill.  Patient verbalized understanding and will call if not any better.  Updated medication list.

## 2020-07-16 NOTE — Telephone Encounter (Signed)
OK to change her prescription to furosemide 40 mg once daily.

## 2020-07-18 ENCOUNTER — Telehealth: Payer: Self-pay | Admitting: Cardiovascular Disease

## 2020-07-18 NOTE — Telephone Encounter (Signed)
Spoke with the patient about her shortness of breath. She stated that she felt like it was not getting any better since the increase in Furosemide on Monday. She has taken 40 mg once daily for 2 days now.   She stated that she has bilateral swelling that does get better over night. She denies shortness of breath at rest and stated that it is mainly when she exerts herself.   She watches her sodium intake but is unable to weigh herself at home.  She has been advised that she should come in for an appointment so that she can be assessed in person.   Appointment made for 3/18 with Dr. Sallyanne Kuster.

## 2020-07-18 NOTE — Telephone Encounter (Signed)
Pt c/o Shortness Of Breath: STAT if SOB developed within the last 24 hours or pt is noticeably SOB on the phone  1. Are you currently SOB (can you hear that pt is SOB on the phone)? yes  2. How long have you been experiencing SOB? It seems to be getting worse  3. Are you SOB when sitting or when up moving around? Both- more when she gets up  4. Are you currently experiencing any other symptoms? A little lightheaded, headache

## 2020-07-19 DIAGNOSIS — I442 Atrioventricular block, complete: Secondary | ICD-10-CM | POA: Diagnosis not present

## 2020-07-19 DIAGNOSIS — K2210 Ulcer of esophagus without bleeding: Secondary | ICD-10-CM | POA: Diagnosis not present

## 2020-07-19 DIAGNOSIS — H409 Unspecified glaucoma: Secondary | ICD-10-CM | POA: Diagnosis not present

## 2020-07-19 DIAGNOSIS — Z950 Presence of cardiac pacemaker: Secondary | ICD-10-CM | POA: Diagnosis not present

## 2020-07-19 DIAGNOSIS — K2100 Gastro-esophageal reflux disease with esophagitis, without bleeding: Secondary | ICD-10-CM | POA: Diagnosis not present

## 2020-07-19 DIAGNOSIS — Z48812 Encounter for surgical aftercare following surgery on the circulatory system: Secondary | ICD-10-CM | POA: Diagnosis not present

## 2020-07-19 DIAGNOSIS — N183 Chronic kidney disease, stage 3 unspecified: Secondary | ICD-10-CM | POA: Diagnosis not present

## 2020-07-19 DIAGNOSIS — I5043 Acute on chronic combined systolic (congestive) and diastolic (congestive) heart failure: Secondary | ICD-10-CM | POA: Diagnosis not present

## 2020-07-19 DIAGNOSIS — N1832 Chronic kidney disease, stage 3b: Secondary | ICD-10-CM | POA: Diagnosis not present

## 2020-07-19 DIAGNOSIS — R69 Illness, unspecified: Secondary | ICD-10-CM | POA: Diagnosis not present

## 2020-07-19 DIAGNOSIS — R079 Chest pain, unspecified: Secondary | ICD-10-CM | POA: Diagnosis not present

## 2020-07-19 DIAGNOSIS — I130 Hypertensive heart and chronic kidney disease with heart failure and stage 1 through stage 4 chronic kidney disease, or unspecified chronic kidney disease: Secondary | ICD-10-CM | POA: Diagnosis not present

## 2020-07-19 DIAGNOSIS — M479 Spondylosis, unspecified: Secondary | ICD-10-CM | POA: Diagnosis not present

## 2020-07-19 DIAGNOSIS — I4821 Permanent atrial fibrillation: Secondary | ICD-10-CM | POA: Diagnosis not present

## 2020-07-19 DIAGNOSIS — D631 Anemia in chronic kidney disease: Secondary | ICD-10-CM | POA: Diagnosis not present

## 2020-07-19 DIAGNOSIS — M1A9XX1 Chronic gout, unspecified, with tophus (tophi): Secondary | ICD-10-CM | POA: Diagnosis not present

## 2020-07-20 ENCOUNTER — Ambulatory Visit (INDEPENDENT_AMBULATORY_CARE_PROVIDER_SITE_OTHER): Payer: Medicare HMO | Admitting: Cardiovascular Disease

## 2020-07-20 ENCOUNTER — Encounter: Payer: Self-pay | Admitting: Cardiovascular Disease

## 2020-07-20 ENCOUNTER — Other Ambulatory Visit: Payer: Self-pay

## 2020-07-20 VITALS — BP 140/67 | HR 60 | Ht 67.0 in | Wt 139.2 lb

## 2020-07-20 DIAGNOSIS — I442 Atrioventricular block, complete: Secondary | ICD-10-CM | POA: Diagnosis not present

## 2020-07-20 DIAGNOSIS — Z950 Presence of cardiac pacemaker: Secondary | ICD-10-CM | POA: Diagnosis not present

## 2020-07-20 DIAGNOSIS — I5032 Chronic diastolic (congestive) heart failure: Secondary | ICD-10-CM | POA: Diagnosis not present

## 2020-07-20 DIAGNOSIS — N1832 Chronic kidney disease, stage 3b: Secondary | ICD-10-CM

## 2020-07-20 DIAGNOSIS — I4821 Permanent atrial fibrillation: Secondary | ICD-10-CM | POA: Diagnosis not present

## 2020-07-20 DIAGNOSIS — M069 Rheumatoid arthritis, unspecified: Secondary | ICD-10-CM

## 2020-07-20 MED ORDER — POTASSIUM CHLORIDE CRYS ER 20 MEQ PO TBCR: EXTENDED_RELEASE_TABLET | ORAL | 0 refills | Status: DC

## 2020-07-20 NOTE — Progress Notes (Addendum)
Cardiology Office Note    Date:  07/23/2020   ID:  JOELEE [name], DOB 14-Aug-1926, MRN [id number]  PCP:  Kimberly Labrum, MD  Cardiologist:   Kimberly Klein, MD   Chief Complaint  Patient presents with  . Edema    History of Present Illness:  Kimberly [name] is a 87 y.o. female with a history of permanent atrial fibrillation with AV node ablation and secondary complete heart block here for follow-up on her CRT-P device (generator change 06/19/2020). She has mild diastolic heart failure (last LVEF 55-60% by echo 11/21/2019).  She complains of exertional dyspnea and leg edema, improved after an increased diuretic dose, but not yet back to normal.  Denies angina, dizziness, syncope, palpitations, frank orthopnea or PND, focal neurological events.  After her recent generator change, we cannot use the OptiVol, as the device site is is healing.  Labs performed today show BNP mildly elevated at 260 and creatinine elevated at 1.74.  Her 2012 biventricular pacemaker (Medtronic Percepta CRT-P), shows 100% biventricular pacing.  Her OptiVol is not yet available for review after her recent device generator change.  Her current generator was implanted in 2012 (right ventricular lead from 1997, coronary sinus lead from 2007).  The atrial port is pinned.  programmed VVIR with LV first.  Both right ventricular and left ventricular pacing thresholds are on automatic capture and stable.    Her most recent echocardiogram performed July 2021 was unchanged from 2016 and showed left ventricular ejection fraction 60-65 %, severe biatrial dilatation, minor valvular abnormalities, peak PA systolic pressure 38 mmHg.  Kimberly [name] has a long-standing history of permanent atrial fibrillation s/p AV node ablation, complete heart block and nonischemic cardiomyopathy with an excellent response to cardiac resynchronization therapy. She originally had a CRT-D device but at the time of generator change out was "downgraded" to  a CRT-P. since her left ventricular ejection fraction increased to 50-55%. Her current device is a Risk analyst CRT-P [id number] implanted in June 2012. Her defibrillator lead, which was a Sprint fidelis lead is capped and an older RV pacemaker lead Medtronic [id number] implanted 1997 is being used. The left ventricular lead is a Medtronic [id number] implanted in 2007. She has long-standing problems with chronic scoliosis and fibromyalgia.  In December 2015 she was hospitalized in [place] and received 2 units of blood transfusion. No bleeding events since that time. Hospitalized in January 2016 after a fall at home,  Possibly precipitated by dehydration complicated by acute on chronic renal failure.   Past Medical History:  Diagnosis Date  . Acute blood loss anemia 10/11/2012  . Acute diverticulitis 08/24/2013  . Acute on chronic combined systolic and diastolic CHF, NYHA class 4 ([place]) 11/15/2013  . Antral ulcer 10/11/2012  . Arrhythmia    atrial fibb  . Atrial fibrillation ([place])   . Bipolar affective disorder ([place]) 03/23/2017  . Cardiomyopathy, nonischemic ([place])   . Chronic anticoagulation 10/12/2012  . CKD (chronic kidney disease) stage 3, GFR 30-59 ml/min (HCC) 10/12/2012  . Depression   . Erosive esophagitis 10/11/2012  . Fibromyalgia   . Glaucoma   . H/O echocardiogram 2007   EF 40-45%,         . Hypertension   . Osteoarthritis   . Pacemaker    Last saw cards 07/2013  . Scoliosis     Past Surgical History:  Procedure Laterality Date  . ABDOMINAL HYSTERECTOMY    . APPENDECTOMY    . BACK SURGERY    .  BIOPSY  03/03/2017   Procedure: BIOPSY;  Surgeon: Kimberly Binder, MD;  [facility];  Service: Endoscopy;;  gastric  . BREAST SURGERY    . CARDIAC CATHETERIZATION  12/08/2005   LAD AND LEFT MAIN WITH NO HIGH-GRADE STENOSIS. MILD DISEASE IN THE CX AND LAD SYSTEM. SEVERE LV DYSFUNCTION WITH DILATION OF THE LV. EF 15-20%. LV END-DIASTOLIC PRESSURE IS 90. +1 MR.  . CHOLECYSTECTOMY    .  COLONOSCOPY N/A 03/03/2017   Dr. Oneida [name]: 5 colon polyps removed, adenomatous.  Diverticulosis.  99% of the colon was cleared but the cecum was not adequately seen.  . CYSTOSCOPY N/A 02/24/2013   Procedure: CYSTOSCOPY WITH URETHRAL DILITATION;  Surgeon: Kimberly Nestle, MD;  Location: [facility];  Service: Urology;  Laterality: N/A;  . DOPPLER ECHOCARDIOGRAPHY N/A 05/30/2010   LV SIZE IS NORMAL. LV SYSTOLIC FUNCTION IS LOW NORMAL. EF=50-55%. MILD INFERIOR HYPOKINESIS.MILD TO MODERATE POSTERIOR WALL HYPOKINESIS.PACEMAKER LEAD IN THE RV. LA IS MILDLY DILATED. RA IS MODERATE TO SEVERLY DILATED. PACEMAKER LEAD IN THE RA. MILD CALCICICATION OF THE MV APPARATUS. MODERATE MR. MILD TO MODERATE TR. MILD PHTN.AV MILDLY SCLEROTIC.  [name] ESOPHAGOGASTRODUODENOSCOPY N/A 10/13/2012   Dr. Gala [name]: severe ulcerative reflux esophagitis, question of Barrett's but negative path, single deep prepyloric antral ulcer, negative H.pylori  . ESOPHAGOGASTRODUODENOSCOPY N/A 03/03/2017   Dr. Oneida [name]: Gastritis/duodenitis, no H. pylori  . HERNIA REPAIR     right inguinal hernia and umbilical  . LOWETR EXT VENOUS Bilateral 11-08-10   R & L- NO EVIDENCE OF THROMBUS OR THROMBOPHLEBITIS. THERE IS MILD AMOUNT OF SUBCUTANEOUS EDEMA NOTED WITHIN THE LEFT CALF AND ANKLE. R & L GSV AND SSV- NO VENOUS INSUFF NOTED.  [name] NECK SURGERY    . NUCLEAR STRESS TEST N/A 02/13/2009   NORMAL PATTERN OF PERFUSION IN ALL REGIONS. POST STRESS VENTICULAR SIZE IS NORMAL. POST  STESS EF 85%.  NORMAL MYOCARDIAL PERFUSION STUDY.  [name] OPEN REDUCTION INTERNAL FIXATION (ORIF) DISTAL PHALANX Left 11/16/2018   Procedure: MIDDLE FINGER OPEN REDUCTION VERSUS RECONSTRUCTION;  Surgeon: Kimberly Kaufman, MD;  Location: [facility];  Service: Orthopedics;  Laterality: Left;  . PACEMAKER INSERTION    . POLYPECTOMY  03/03/2017   Procedure: POLYPECTOMY;  Surgeon: Kimberly Binder, MD;  [facility];  Service: Endoscopy;;  colon  . PPM GENERATOR CHANGEOUT N/A 06/18/2020    Procedure: PPM GENERATOR CHANGEOUT;  Surgeon: Kimberly Klein, MD;  Location: [facility];  Service: Cardiovascular;  Laterality: N/A;  . TONSILLECTOMY    . YAG LASER APPLICATION Bilateral 7/90/2409   Procedure: YAG LASER APPLICATION;  Surgeon: Williams Che, MD;  Location: [facility];  Service: Ophthalmology;  Laterality: Bilateral;    Current Medications: Outpatient Medications Prior to Visit  Medication Sig Dispense Refill  . acetaminophen (TYLENOL) 325 MG tablet Take 325 mg by mouth every 6 (six) hours as needed for headache.    [name] apixaban (ELIQUIS) 2.5 MG TABS tablet Take 2.5 mg by mouth 2 (two) times daily.    [name] CALCIUM CARBONATE ANTACID PO Take 1 tablet by mouth daily as needed for heartburn or indigestion.    . carvedilol (COREG) 6.25 MG tablet Take 6.25 mg by mouth 2 (two) times daily with a meal.    . colchicine 0.6 MG tablet Take 0.6 mg by mouth daily. Take 1.2 mg at one time ,then take 0.6 mg one hour later as needed    . Multiple Vitamins-Minerals (PRESERVISION AREDS 2) CAPS Take 1 capsule by mouth 2 (two)  times daily.     . pantoprazole (PROTONIX) 40 MG tablet Take 1 tablet (40 mg total) by mouth daily as needed. 90 tablet 3  . polyethylene glycol (MIRALAX / GLYCOLAX) 17 g packet Take 17 g by mouth daily. 14 each 0  . Polyethylene Glycol 400 (BLINK TEARS) 0.25 % SOLN Place 1 drop into both eyes daily as needed (for dry eye relief).    [name] senna-docusate (SENOKOT-S) 8.6-50 MG tablet Take 1 tablet by mouth at bedtime as needed for mild constipation. 90 tablet 3  . sertraline (ZOLOFT) 25 MG tablet Take 25 mg by mouth daily. 1 Tablet Daily    . furosemide (LASIX) 40 MG tablet Take 1 tablet (40 mg total) by mouth daily. 30 tablet 2   No facility-administered medications prior to visit.     Allergies:   Ciprofloxacin, Flagyl [metronidazole], Papaya derivatives, Iodine, Penicillins, and Sulfa antibiotics   Social History   Socioeconomic History  . Marital status: Widowed     Spouse name: Not on file  . Number of children: Not on file  . Years of education: Not on file  . Highest education level: Some college, no degree  Occupational History  . Occupation: IT trainer: RETIRED    Comment: retired  Tobacco Use  . Smoking status: Former Smoker    Packs/day: 0.50    Years: 30.00    Pack years: 15.00    Types: Cigarettes    Quit date: 12/13/2004    Years since quitting: 15.6  . Smokeless tobacco: Never Used  . Tobacco comment: former smoker  Vaping Use  . Vaping Use: Never used  Substance and Sexual Activity  . Alcohol use: Yes    Alcohol/week: 0.0 standard drinks    Comment: history of drinking a 1/2 glass of wine in the evening  . Drug use: No  . Sexual activity: Never  Other Topics Concern  . Not on file  Social History Narrative   No home exercise program. PT ordered this week.   Social Determinants of Health   Financial Resource Strain: Low Risk   . Difficulty of Paying Living Expenses: Not very hard  Food Insecurity: No Food Insecurity  . Worried About Charity fundraiser in the Last Year: Never true  . Ran Out of Food in the Last Year: Never true  Transportation Needs: Unmet Transportation Needs  . Lack of Transportation (Medical): Yes  . Lack of Transportation (Non-Medical): No  Physical Activity: Inactive  . Days of Exercise per Week: 0 days  . Minutes of Exercise per Session: 0 min  Stress: Not on file  Social Connections: Unknown  . Frequency of Communication with Friends and Family: Twice a week  . Frequency of Social Gatherings with Friends and Family: Once a week  . Attends Religious Services: Never  . Active Member of Clubs or Organizations: No  . Attends Archivist Meetings: Not on file  . Marital Status: Not on file     Family History:  The patient's family history includes Asthma in her sister; Cancer in her brother and sister; Heart failure in her brother; Pulmonary embolism in her brother.    ROS:   Please see the history of present illness.    All other systems are reviewed and are negative  PHYSICAL EXAM:   VS:  BP 140/67   Pulse 60   Ht 5\' 7"  (1.702 m)   Wt 139 lb 3.2 oz (63.1 kg)   SpO2  97%   BMI 21.80 kg/m      General: Alert, oriented x3, no distress, prominent thoracic scoliosis.  Well-healed left subclavian pacemaker site. Head: no evidence of trauma, PERRL, EOMI, no exophtalmos or lid lag, no myxedema, no xanthelasma; normal ears, nose and oropharynx Neck: 5 cm elevation in jugular venous pulsations and no hepatojugular reflux; brisk carotid pulses without delay and no carotid bruits Chest: clear to auscultation, no signs of consolidation by percussion or palpation, normal fremitus, symmetrical and full respiratory excursions Cardiovascular: normal position and quality of the apical impulse, regular rhythm, normal first and paradoxically split second heart sounds, no murmurs, rubs or gallops Abdomen: no tenderness or distention, no masses by palpation, no abnormal pulsatility or arterial bruits, normal bowel sounds, no hepatosplenomegaly Extremities: Prominent rheumatoid finger and wrist he formations particularly obvious on the right hand.  2+ edema of the ankles and feet bilaterally. Neurological: grossly nonfocal Psych: Normal mood and affect    Wt Readings from Last 3 Encounters:  07/20/20 139 lb 3.2 oz (63.1 kg)  07/05/20 136 lb (61.7 kg)  06/17/20 141 lb 15.6 oz (64.4 kg)      Studies/Labs Reviewed:   ECHO 11/21/2019   1. Left ventricular ejection fraction, by estimation, is 55 to 60%. The  left ventricle has normal function. The left ventricle has no regional  wall motion abnormalities. Left ventricular diastolic function could not  be evaluated.  2. Right ventricular systolic function is normal. The right ventricular  size is normal. There is mildly elevated pulmonary artery systolic  pressure. The estimated right ventricular systolic  pressure is 67.6 mmHg.  3. Left atrial size was severely dilated.  4. Right atrial size was severely dilated.  5. The mitral valve is degenerative. Mild mitral valve regurgitation. No  evidence of mitral stenosis.  6. The aortic valve is tricuspid. Aortic valve regurgitation is not  visualized. Mild to moderate aortic valve sclerosis/calcification is  present, without any evidence of aortic stenosis.  7. The inferior vena cava is normal in size with greater than 50%  respiratory variability, suggesting right atrial pressure of 3 mmHg.   Comparison(s): No significant change from prior study. Prior EF 60-65%  (2016).   EKG:  EKG is not ordered today.  ECG November 17, 2019 shows atrial fibrillation with biventricular pacing and positive R wave in lead V1. Recent Labs: 06/19/2020: ALT 13; Magnesium 2.2 07/05/2020: Hemoglobin 10.9; Platelets 209 07/20/2020: BNP 260.2; BUN 27; Creatinine, Ser 1.74; Potassium 4.3; Sodium 140   Lipid Panel    Component Value Date/Time   CHOL 179 03/15/2018 1645   TRIG 65 03/15/2018 1645   HDL 84 03/15/2018 1645   CHOLHDL 2.1 03/15/2018 1645   CHOLHDL 2.3 02/06/2009 0450   VLDL 16 02/06/2009 0450   LDLCALC 82 03/15/2018 1645    ASSESSMENT:    1. Chronic diastolic heart failure ([place])   2. Permanent atrial fibrillation   3. CHB (complete heart block) (HCC)   4. Biventricular cardiac pacemaker in situ   5. Rheumatoid arthritis of metacarpophalangeal joint (HCC)   6. Stage 3b chronic kidney disease ([place])      PLAN:  In order of problems listed above:  1. CHF: Edema and mild jugular venous distention are present.  Labs obtained after her clinic visit today show only a relatively mild elevation in BNP, not as high as in the past.  Creatinine was higher than her baseline.  Plan just 2 days of increased dose diuretics then return to a  daily prescription of furosemide 40 mg daily.  She is a CRT hyperresponder who has recovered left ventricular systolic  function.  2. AFib s/p AV node ablation: On lower dose Eliquis due to her advanced age, small body size and volatile renal function.  She has never had a stroke or other embolic events.  No recent documented falls or bleeding problems.  CHA2DS2-VASc 4 (age 24, gender, history of CHF) 3. CHB: Pacemaker dependent after AV node ablation 4. CRT-P: She is pacemaker dependent and we anticipate need for generator change in 3-4 months.  Doing monthly downloads from her device. 5. Rheumatoid arthritis: Recurrent episodes of inflammatory exacerbation particularly in the digits of the right hand, currently appears to be quiesced and.  Refuses referral to rheumatology/orthopedic surgery.. 6. CKD 3B: Recheck renal parameters at a follow-up visit in 1-2 weeks.    Medication Adjustments/Labs and Tests Ordered: Current medicines are reviewed at length with the patient today.  Concerns regarding medicines are outlined above.  Medication changes, Labs and Tests ordered today are listed in the Patient Instructions below. Patient Instructions  Medication Instructions:  FUROSEMIDE: Take 80 mg (2 tablets) for the next 2 days (Saturday and Sunday) then go back to the 40 mg once daily.  TAKE Potassium 20 mEq on Saturday and Sunday and then only as directed when taking a higher dosage of Furosemide   *If you need a refill on your cardiac medications before your next appointment, please call your pharmacy*   Lab Work: Your provider would like for you to have the following labs today: BNP and BMET  If you have labs (blood work) drawn today and your tests are completely normal, you will receive your results only by: [name] MyChart Message (if you have MyChart) OR . A paper copy in the mail If you have any lab test that is abnormal or we need to change your treatment, we will call you to review the results.   Testing/Procedures: None ordered   Follow-Up: At [facility], you and your health needs are our priority.   As part of our continuing mission to provide you with exceptional heart care, we have created designated Provider Care Teams.  These Care Teams include your primary Cardiologist (physician) and Advanced Practice Providers (APPs -  Physician Assistants and Nurse Practitioners) who all work together to provide you with the care you need, when you need it.  We recommend signing up for the patient portal called "MyChart".  Sign up information is provided on this After Visit Summary.  MyChart is used to connect with patients for Virtual Visits (Telemedicine).  Patients are able to view lab/test results, encounter notes, upcoming appointments, etc.  Non-urgent messages can be sent to your provider as well.   To learn more about what you can do with MyChart, go to [email or web address].    Your next appointment:   Follow up in 1-2 weeks with an APP    Signed, Kimberly Klein, MD  07/23/2020 6:17 PM    [facility] [address], [place], [place]  [address] Phone: [phone]; Fax: [phone]

## 2020-07-20 NOTE — Patient Instructions (Addendum)
Medication Instructions:  FUROSEMIDE: Take 80 mg (2 tablets) for the next 2 days (Saturday and Sunday) then go back to the 40 mg once daily.  TAKE Potassium 20 mEq on Saturday and Sunday and then only as directed when taking a higher dosage of Furosemide   *If you need a refill on your cardiac medications before your next appointment, please call your pharmacy*   Lab Work: Your provider would like for you to have the following labs today: BNP and BMET  If you have labs (blood work) drawn today and your tests are completely normal, you will receive your results only by: [name] MyChart Message (if you have MyChart) OR . A paper copy in the mail If you have any lab test that is abnormal or we need to change your treatment, we will call you to review the results.   Testing/Procedures: None ordered   Follow-Up: At [facility], you and your health needs are our priority.  As part of our continuing mission to provide you with exceptional heart care, we have created designated Provider Care Teams.  These Care Teams include your primary Cardiologist (physician) and Advanced Practice Providers (APPs -  Physician Assistants and Nurse Practitioners) who all work together to provide you with the care you need, when you need it.  We recommend signing up for the patient portal called "MyChart".  Sign up information is provided on this After Visit Summary.  MyChart is used to connect with patients for Virtual Visits (Telemedicine).  Patients are able to view lab/test results, encounter notes, upcoming appointments, etc.  Non-urgent messages can be sent to your provider as well.   To learn more about what you can do with MyChart, go to [email or web address].    Your next appointment:   Follow up in 1-2 weeks with an APP

## 2020-07-21 LAB — BRAIN NATRIURETIC PEPTIDE: BNP: 260.2 pg/mL — ABNORMAL HIGH (ref 0.0–100.0)

## 2020-07-21 LAB — BASIC METABOLIC PANEL
BUN/Creatinine Ratio: 16 (ref 12–28)
BUN: 27 mg/dL (ref 10–36)
CO2: 22 mmol/L (ref 20–29)
Calcium: 10.3 mg/dL (ref 8.7–10.3)
Chloride: 96 mmol/L (ref 96–106)
Creatinine, Ser: 1.74 mg/dL — ABNORMAL HIGH (ref 0.57–1.00)
Glucose: 95 mg/dL (ref 65–99)
Potassium: 4.3 mmol/L (ref 3.5–5.2)
Sodium: 140 mmol/L (ref 134–144)
eGFR: 27 mL/min/{1.73_m2} — ABNORMAL LOW (ref >59)

## 2020-07-22 DIAGNOSIS — R109 Unspecified abdominal pain: Secondary | ICD-10-CM | POA: Diagnosis not present

## 2020-07-22 DIAGNOSIS — I10 Essential (primary) hypertension: Secondary | ICD-10-CM | POA: Diagnosis not present

## 2020-07-22 DIAGNOSIS — R1031 Right lower quadrant pain: Secondary | ICD-10-CM | POA: Diagnosis not present

## 2020-07-22 DIAGNOSIS — R1084 Generalized abdominal pain: Secondary | ICD-10-CM | POA: Diagnosis not present

## 2020-07-22 DIAGNOSIS — R5381 Other malaise: Secondary | ICD-10-CM | POA: Diagnosis not present

## 2020-07-22 DIAGNOSIS — Z95810 Presence of automatic (implantable) cardiac defibrillator: Secondary | ICD-10-CM | POA: Diagnosis not present

## 2020-07-23 ENCOUNTER — Encounter: Payer: Self-pay | Admitting: Cardiovascular Disease

## 2020-07-23 ENCOUNTER — Other Ambulatory Visit: Payer: Self-pay | Admitting: *Deleted

## 2020-07-23 ENCOUNTER — Ambulatory Visit: Payer: Medicare Other | Admitting: Gastroenterology

## 2020-07-23 DIAGNOSIS — H409 Unspecified glaucoma: Secondary | ICD-10-CM | POA: Diagnosis not present

## 2020-07-23 DIAGNOSIS — Z48812 Encounter for surgical aftercare following surgery on the circulatory system: Secondary | ICD-10-CM | POA: Diagnosis not present

## 2020-07-23 DIAGNOSIS — I4821 Permanent atrial fibrillation: Secondary | ICD-10-CM | POA: Diagnosis not present

## 2020-07-23 DIAGNOSIS — M479 Spondylosis, unspecified: Secondary | ICD-10-CM | POA: Diagnosis not present

## 2020-07-23 DIAGNOSIS — R69 Illness, unspecified: Secondary | ICD-10-CM | POA: Diagnosis not present

## 2020-07-23 DIAGNOSIS — K2100 Gastro-esophageal reflux disease with esophagitis, without bleeding: Secondary | ICD-10-CM | POA: Diagnosis not present

## 2020-07-23 DIAGNOSIS — D631 Anemia in chronic kidney disease: Secondary | ICD-10-CM | POA: Diagnosis not present

## 2020-07-23 DIAGNOSIS — I5043 Acute on chronic combined systolic (congestive) and diastolic (congestive) heart failure: Secondary | ICD-10-CM | POA: Diagnosis not present

## 2020-07-23 DIAGNOSIS — N1832 Chronic kidney disease, stage 3b: Secondary | ICD-10-CM | POA: Diagnosis not present

## 2020-07-23 DIAGNOSIS — I130 Hypertensive heart and chronic kidney disease with heart failure and stage 1 through stage 4 chronic kidney disease, or unspecified chronic kidney disease: Secondary | ICD-10-CM | POA: Diagnosis not present

## 2020-07-23 MED ORDER — FUROSEMIDE 40 MG PO TABS: 40.0000 mg | ORAL_TABLET | Freq: Every day | ORAL | 2 refills | Status: DC

## 2020-07-24 ENCOUNTER — Telehealth: Payer: Self-pay | Admitting: Cardiovascular Disease

## 2020-07-24 ENCOUNTER — Telehealth: Payer: Self-pay | Admitting: Licensed Clinical Social Worker

## 2020-07-24 NOTE — Telephone Encounter (Signed)
Spoke to patient she stated she wanted to make sure she understands potassium directions.Advised she is only to take as directed by Dr. when she takes higher dose of furosemide.

## 2020-07-24 NOTE — Telephone Encounter (Signed)
New message:     Patient calling stating that there are no instruction on how to to take potassium.

## 2020-07-24 NOTE — Telephone Encounter (Signed)
[name] contacted for pt ride needs, I was able to schedule ride for pt to her April 1st appointment. She will be provided door to door services. Pt called to be provided this update; she did not answer but HIPAA compliant message left for pt at [phone].  I have let Lattie Haw, RN, know this update as well.   Westley Hummer, MSW, [place]  [phone]

## 2020-07-25 NOTE — Progress Notes (Signed)
Referring Provider: Curlene Labrum, MD Primary Care Physician:  Curlene Labrum, MD Primary GI Physician: Dr. Abbey Chatters  Chief Complaint  Patient presents with  . Abdominal Pain    Blood in stools, alternating diarrhea and constipation    HPI:   Kimberly [name] is a 87 y.o. female presenting today for alternating constipation and diarrhea, RLQ abdominal pain, previously with black stool, and GERD.  History of chronic right lower quadrant pain for years felt to be musculoskeletal in the past.  Last colonoscopy in 2018 with 5 polyps removed, diverticulosis in the rectosigmoid and sigmoid colon, internal hemorrhoids.  Pathology with tubular adenomas, sessile serrated polyp, hyperplastic polyp.  Only able to see 99% of the colon.  Unable to clear cecum for examination.  Dr. Oneida Alar had recommended repeat colonoscopy in 1-2 months, but this was not completed.  Overall, suspect rectal bleeding was secondary to sigmoid colon polyp and diverticulosis in the setting of Eliquis. EGD in October 2018 with moderate gastritis, mild duodenitis. Pathology with chronic gastritis, negative H. Pylori.  Hospitalized with episode of diverticulitis and suspected diverticular bleed in June 2020. No endoscopic evaluation at that time.   Patient was seen in our office in September 2021.  She reported chronic intermittent right-sided abdominal pain which she has had for years.  Denied any worsening of symptoms.  No associated vomiting.  No association with meals.  Worse with movement.  BMs regular with no alarm symptoms.  Hospitalized in January 2022 at [facility] with anemia and suspected upper GI bleed with 1 melanotic stool noted on arrival.  This is in the setting of anticoagulation on Eliquis.  Hemoglobin was 10.1 on admission and declined to 8.9 the following day. Eliquis was held and she received 2 unit PRBCs with hemoglobin remaining in the 10-11 range thereafter.  No acute findings on CT A/P w/o contrast.  She was discharged and advised to continue to hold Eliquis until following up with PCP.  Also recommended to follow-up with GI for consideration of endoscopy and possible colonoscopy.  2 other CTs performed in January/February for abdominal pain with no acute findings.  Abdominal x-ray in March again with no significant findings.  Follow-up with cardiology 3/3 and Eliquis was resumed.   Today:   Alternating constipation and diarrhea: States her bowel movements are all over the place with no regular pattern.  Stools can be soft, formed, hard, loose, or watery.  No identified trigger of constipation or diarrhea.  Drinks 4 to 5 cups of water daily.  Drinks milk daily and does not notice this causing any trouble with her stomach. Can skip up to 3 days at a time between bowel movements. Will drink prune juice or a liquid laxative when she is constipated.  Unable to recall the name of liquid laxative, but states it is not mag citrate. She takes the liquid laxative about once a week which causes several loose bowel movements.  Notes diarrhea seems to follow episodes of constipation.  Max 2-3 bowel movements per day.  She will take an over-the-counter antidiarrhea pill when she has diarrhea.  She cannot recall the name.  Had a black stool back in January.  Cannot remember if this was just 1 or more than 1.  No bright red blood per rectum. Has taken iron, but can't remember how long ago. No pepto bismol. Most recent hemoglobin 10.3 on 07/22/20 which is stable.   Nausea without vomiting after eating. GERD symptoms daily. Not taking Protonix.  Taking chewable antacid as needed. No dysphagia.   Abdominal Pain:   She has chronic right lower quadrant abdominal pain.  Seems this has been worsening somewhat for the last few months. Pain constant and has been for the last several months.  Used to come and go.  Movement worsens symptoms. No worse with meals. Worse with BMs though no relief with the BMs. Thinks abdominal  pain may worsen with bowel movements due to straining.  Some days are worse than others. No worse since last CT scan in Feb. Taking tylenol as needed. Helps some. Couple tylenol a day or none. States it can get to a 10 if moving around.  Also has a lot of chronic back pain.  Reports she fell last week and has had some worsening of her back pain.  Denies urinary symptoms.  Denies fever, chills, cold or flulike symptoms, presyncope, syncope.  Admits to worsening shortness of breath over the last couple of months.  She has been working with cardiology and has had some adjustment in her fluid pills.  This has helped some, but she is not back to baseline.  No chest pain.  No significant swelling in the lower extremities.  Has an upcoming appointment with cardiology on 4/1.  It is difficult to get a detailed history from patient today.  Often times when asking detailed questions to remember timing or frequency, patient states she cannot remember.  She presented with her social worker today who also says she is unable to provide me with any additional information as she is only with patient once a week.  Past Medical History:  Diagnosis Date  . Acute blood loss anemia 10/11/2012  . Acute diverticulitis 08/24/2013  . Acute on chronic combined systolic and diastolic CHF, NYHA class 4 ([place]) 11/15/2013  . Antral ulcer 10/11/2012  . Arrhythmia    atrial fibb  . Atrial fibrillation ([place])   . Bipolar affective disorder ([place]) 03/23/2017  . Cardiomyopathy, nonischemic ([place])   . Chronic anticoagulation 10/12/2012  . CKD (chronic kidney disease) stage 3, GFR 30-59 ml/min ([facility]) 10/12/2012  . Depression   . Erosive esophagitis 10/11/2012  . Fibromyalgia   . Glaucoma   . H/O echocardiogram 2007   EF 40-45%,         . Hypertension   . Osteoarthritis   . Pacemaker    Last saw cards 07/2013  . Scoliosis     Past Surgical History:  Procedure Laterality Date  . ABDOMINAL HYSTERECTOMY    . APPENDECTOMY    . BACK  SURGERY    . BIOPSY  03/03/2017   Procedure: BIOPSY;  Surgeon: Danie Binder, MD;  [facility];  Service: Endoscopy;;  gastric  . BREAST SURGERY    . CARDIAC CATHETERIZATION  12/08/2005   LAD AND LEFT MAIN WITH NO HIGH-GRADE STENOSIS. MILD DISEASE IN THE CX AND LAD SYSTEM. SEVERE LV DYSFUNCTION WITH DILATION OF THE LV. EF 15-20%. LV END-DIASTOLIC PRESSURE IS 90. +1 MR.  . CHOLECYSTECTOMY    . COLONOSCOPY N/A 03/03/2017   Dr. Oneida Alar: 5 colon polyps removed, adenomatous.  Diverticulosis.  99% of the colon was cleared but the cecum was not adequately seen.  . CYSTOSCOPY N/A 02/24/2013   Procedure: CYSTOSCOPY WITH URETHRAL DILITATION;  Surgeon: Marissa Nestle, MD;  Location: [facility];  Service: Urology;  Laterality: N/A;  . DOPPLER ECHOCARDIOGRAPHY N/A 05/30/2010   LV SIZE IS NORMAL. LV SYSTOLIC FUNCTION IS LOW NORMAL. EF=50-55%. MILD INFERIOR HYPOKINESIS.MILD TO MODERATE  POSTERIOR WALL HYPOKINESIS.PACEMAKER LEAD IN THE RV. LA IS MILDLY DILATED. RA IS MODERATE TO SEVERLY DILATED. PACEMAKER LEAD IN THE RA. MILD CALCICICATION OF THE MV APPARATUS. MODERATE MR. MILD TO MODERATE TR. MILD PHTN.AV MILDLY SCLEROTIC.  [name] ESOPHAGOGASTRODUODENOSCOPY N/A 10/13/2012   Dr. Gala Romney: severe ulcerative reflux esophagitis, question of Barrett's but negative path, single deep prepyloric antral ulcer, negative H.pylori  . ESOPHAGOGASTRODUODENOSCOPY N/A 03/03/2017   Dr. Oneida Alar: Gastritis/duodenitis, no H. pylori  . HERNIA REPAIR     right inguinal hernia and umbilical  . LOWETR EXT VENOUS Bilateral 11-08-10   R & L- NO EVIDENCE OF THROMBUS OR THROMBOPHLEBITIS. THERE IS MILD AMOUNT OF SUBCUTANEOUS EDEMA NOTED WITHIN THE LEFT CALF AND ANKLE. R & L GSV AND SSV- NO VENOUS INSUFF NOTED.  [name] NECK SURGERY    . NUCLEAR STRESS TEST N/A 02/13/2009   NORMAL PATTERN OF PERFUSION IN ALL REGIONS. POST STRESS VENTICULAR SIZE IS NORMAL. POST  STESS EF 85%.  NORMAL MYOCARDIAL PERFUSION STUDY.  [name] OPEN REDUCTION INTERNAL FIXATION  (ORIF) DISTAL PHALANX Left 11/16/2018   Procedure: MIDDLE FINGER OPEN REDUCTION VERSUS RECONSTRUCTION;  Surgeon: Roseanne Kaufman, MD;  Location: [place];  Service: Orthopedics;  Laterality: Left;  . PACEMAKER INSERTION    . POLYPECTOMY  03/03/2017   Procedure: POLYPECTOMY;  Surgeon: Danie Binder, MD;  [facility];  Service: Endoscopy;;  colon  . PPM GENERATOR CHANGEOUT N/A 06/18/2020   Procedure: PPM GENERATOR CHANGEOUT;  Surgeon: Sanda Klein, MD;  Location: [place] [facility];  Service: Cardiovascular;  Laterality: N/A;  . TONSILLECTOMY    . YAG LASER APPLICATION Bilateral 0/11/6224   Procedure: YAG LASER APPLICATION;  Surgeon: Williams Che, MD;  Location: [facility];  Service: Ophthalmology;  Laterality: Bilateral;    Current Outpatient Medications  Medication Sig Dispense Refill  . acetaminophen (TYLENOL) 325 MG tablet Take 325 mg by mouth as needed for headache.    [name] apixaban (ELIQUIS) 2.5 MG TABS tablet Take 2.5 mg by mouth 2 (two) times daily.    [name] CALCIUM CARBONATE ANTACID PO Take 1 tablet by mouth daily as needed for heartburn or indigestion.    . carvedilol (COREG) 6.25 MG tablet Take 6.25 mg by mouth 2 (two) times daily with a meal.    . furosemide (LASIX) 40 MG tablet Take 1 tablet (40 mg total) by mouth daily. 30 tablet 2  . Multiple Vitamins-Minerals (PRESERVISION AREDS 2) CAPS Take 1 capsule by mouth 2 (two) times daily.     . Polyethylene Glycol 400 (BLINK TEARS) 0.25 % SOLN Place 1 drop into both eyes daily as needed (for dry eye relief).    . potassium chloride SA (KLOR-CON) 20 MEQ tablet Take one tablet only as directed by the provider (when taking the 80 mg of Furosemide) 30 tablet 0  . sertraline (ZOLOFT) 25 MG tablet Take 25 mg by mouth daily. 1 Tablet Daily    . colchicine 0.6 MG tablet Take 0.6 mg by mouth daily. Take 1.2 mg at one time ,then take 0.6 mg one hour later as needed (Patient not taking: Reported on 07/26/2020)    . pantoprazole (PROTONIX) 40  MG tablet Take 1 tablet (40 mg total) by mouth daily before breakfast. 30 tablet 3   No current facility-administered medications for this visit.    Allergies as of 07/26/2020 - Review Complete 07/26/2020  Allergen Reaction Noted  . Ciprofloxacin Other (See Comments) 03/21/2017  . Flagyl [metronidazole] Other (See Comments) 03/21/2017  . Papaya derivatives  Hives 01/22/2011  . Iodine Rash and Other (See Comments) 01/22/2011  . Penicillins Hives 01/22/2011  . Sulfa antibiotics Rash 01/22/2011    Family History  Problem Relation Age of Onset  . Cancer Sister   . Asthma Sister   . Heart failure Brother   . Pulmonary embolism Brother   . Cancer Brother   . Colon cancer Neg Hx     Social History   Socioeconomic History  . Marital status: Widowed    Spouse name: Not on file  . Number of children: Not on file  . Years of education: Not on file  . Highest education level: Some college, no degree  Occupational History  . Occupation: IT trainer: RETIRED    Comment: retired  Tobacco Use  . Smoking status: Former Smoker    Packs/day: 0.50    Years: 30.00    Pack years: 15.00    Types: Cigarettes    Quit date: 12/13/2004    Years since quitting: 15.6  . Smokeless tobacco: Never Used  . Tobacco comment: former smoker  Vaping Use  . Vaping Use: Never used  Substance and Sexual Activity  . Alcohol use: Yes    Alcohol/week: 0.0 standard drinks    Comment: history of drinking a 1/2 glass of wine in the evening  . Drug use: No  . Sexual activity: Never  Other Topics Concern  . Not on file  Social History Narrative   No home exercise program. PT ordered this week.   Social Determinants of Health   Financial Resource Strain: Low Risk   . Difficulty of Paying Living Expenses: Not very hard  Food Insecurity: No Food Insecurity  . Worried About Charity fundraiser in the Last Year: Never true  . Ran Out of Food in the Last Year: Never true  Transportation  Needs: Unmet Transportation Needs  . Lack of Transportation (Medical): Yes  . Lack of Transportation (Non-Medical): No  Physical Activity: Inactive  . Days of Exercise per Week: 0 days  . Minutes of Exercise per Session: 0 min  Stress: Not on file  Social Connections: Unknown  . Frequency of Communication with Friends and Family: Twice a week  . Frequency of Social Gatherings with Friends and Family: Once a week  . Attends Religious Services: Never  . Active Member of Clubs or Organizations: No  . Attends Archivist Meetings: Not on file  . Marital Status: Not on file    Review of Systems: Gen: See HPI CV: See HPI Resp: See HPI GI: See HPI Derm: Denies rash Heme: See HPI  Physical Exam: BP (!) 121/52   Pulse 60   Temp (!) 96.8 F (36 C) (Temporal)   Ht 5\' 7"  (1.702 m)   Wt 141 lb 12.8 oz (64.3 kg)   BMI 22.21 kg/m  General:   Alert and oriented. No distress noted. Pleasant and cooperative.  Frail appearing.  Patient unable to lay on her back at less than 60 degree angle due to back pain. Head:  Normocephalic and atraumatic. Eyes:  Conjuctiva clear without scleral icterus. Heart:  S1, S2 present without murmurs appreciated. Lungs: Faint bibasilar crackles bilaterally, No wheezes or rhonchi. No distress.  Abdomen:  +BS, soft, and non-distended. Moderate TTP in suprapubic region and RLQ. No rebound or guarding. No HSM or masses noted. Msk:  Symmetrical without gross deformities. Kyphosis with scoliosis. Extremities:  With 1+ edema in ankles. Bruising noted on right lower leg  from recent fall.  Neurologic:  Alert and  oriented x4 Psych:  Normal mood and affect.   Assessment: 87 year old female with history of chronic RLQ pain for years thought to be musculoskeletal, adenomatous colon polyps, GERD, PUD in 2014, suspected diverticular bleeds in the setting of Eliquis, CKD, complete heart block s/p pacemaker, heart failure, atrial fibrillation on Eliquis who is  presenting today for further evaluation of alternating constipation and diarrhea and RLQ pain.  Also reporting episode of black stool in January and GERD.  Alternating constipation and diarrhea: Suspect patient is more on the constipated side and is likely experiencing some overflow diarrhea as well as passing the mark each way with laxatives and/or antidiarrheal medications.  Denies any particular dietary triggers of diarrhea. No bright red blood per rectum or significant weight loss.  She does have history of cholecystectomy and mild pancreatic atrophy noted on prior CTs which could contribute to looser bowel movements, though again, seems patient is leaning more towards constipation. Also with chronic RLQ abdominal pain as per below with some worsening. Suspect underlying constipation may be influencing symptoms. Notably, her last colonoscopy was in 2018 and incomplete with recommendations to repeat in 1-2 months, but this has never been completed. We will hold off on this for now.   I will try to reset her bowel regimen with Benefiber and Colace daily.  Requested progress report in 2 weeks.  Abdominal Pain: Chronic RLQ abdominal pain that has been worsening over the last few months, with constant pain to some degree. Some days are worse than others.  Pain is worsened with movement or with bowel movements, possibly secondary to straining, and not affected by meals.  She has had some change in her bowel habits, now with alternating constipation and diarrhea as per above, though she seems to be more likely on the constipated side.  Denies bright red blood per rectum, fever, chills, or urinary symptoms.  Reports an episode of melanotic stool in January but none since. She has had several CT scans over the last year for RLQ abdominal pain with no acute findings and she denies any worsening since her last CT scan in February 2022.  Labs 07/22/20 with no leukocytosis, electrolytes and LFTs normal, kidney function  stable.  On exam, she has moderate TTP in suprapubic area and RLQ.  Suspect this is likely multifactorial including musculoskeletal etiology in the setting of chronic back pain and recent fall with worsening back pain, adhesions with history of multiple abdominal surgeries including appendectomy and hysterectomy, and underlying constipation.  Less likely UTI and doubt diverticulitis with multiple negative CT scans and no recent worsening.  Notably, occult malignancy remains in the differential as her last colonoscopy was in 2018 with incomplete visualization of the cecum.  We will plan to focus on improving bowel regularity over the next few weeks and monitor for improvement. Will also evaluate for UTI. If any significant worsening, would consider repeat CT.  Cannot rule out need for colonoscopy.  I discussed this possibility with the patient today including risk and benefits considering her multiple comorbidities and worsening shortness of breath recently.  Patient would like to focus on improving her bowel function at this time.  She has upcoming cardiology appointment on 4/1 for further management/evaluation of her shortness of breath and chronic cardiac conditions.  Dark stools: Patient reports having black stool in January.  She is unable to recall number of episodes.  She was hospitalized at [facility] at that time with hemoglobin  8.9, down from baseline of 10-11 range.  1 melanotic stool on arrival, otherwise no overt GI bleeding throughout hospitalization.  She received 2 unit PRBCs during admission and hemoglobin remained stable.  No acute findings on CT A/P without contrast.  No endoscopic evaluation was pursued.  Patient denies any further melanotic stools or bright red blood per rectum.  Notes she has taken iron but cannot remember the last time she took this.  No Pepto-Bismol.  History of PUD in 2014, currently with uncontrolled GERD as she is not taking PPI.  Denies upper abdominal pain,  NSAID use, or dysphagia.  Most recent hemoglobin remained stable at 10.3 on 07/22/2020. No recent iron panel on file. Notably, chronic anemia likely in the setting of chronic disease.  Cannot rule out upper GI bleed with differentials including PUD, gastritis, duodenitis, AMVs in the setting of Eliquis.  Last EGD in 2018 with moderate gastritis and mild duodenitis.  Negative H. Pylori.  Discussed possibility EGD with patient today.  Ultimately, decision was made to resume Protonix 40 mg daily and monitor.  Consider EGD if return of melanotic stools or decline in hemoglobin.  We will update iron panel and obtain IFOBT.  GERD: Uncontrolled.  Taking OTC antacid as needed the patient reports having GERD symptoms daily.  No dysphagia.  She will start Protonix 40 mg daily.  Plan: 1.  UA, urine culture, iron panel, IFOBT 2.  Stop over-the-counter laxatives and antidiarrheal pills. 3.  Start Benefiber 3 teaspoons daily x2 weeks then increase to twice daily. 4.  Start Colace 100 mg daily. 5.  Start Protonix 40 mg daily 30 minutes before breakfast.  Prescription sent to pharmacy. 6.  Counseled on GERD diet/lifestyle.  Written instructions provided. 7.  Requested progress report in 2 weeks.  8.  Order CT A/P if any acute worsening abdominal pain.  9.  Will need to monitor for recurrent melena or decline in hemoglobin moving forward and consider EGD.  10.  May need to consider colonoscopy to further evaluate RLQ pain if no improvement with improving bowel regularity as last colonoscopy was incomplete.   11.  Keep upcoming appointment with cardiology on 4/1. 12.  Follow-up in 4-6 weeks.   Aliene Altes, PA-C [facility] 07/26/2020

## 2020-07-26 ENCOUNTER — Ambulatory Visit (INDEPENDENT_AMBULATORY_CARE_PROVIDER_SITE_OTHER): Payer: Medicare HMO | Admitting: Gastroenterology

## 2020-07-26 ENCOUNTER — Encounter: Payer: Self-pay | Admitting: Gastroenterology

## 2020-07-26 ENCOUNTER — Other Ambulatory Visit: Payer: Self-pay

## 2020-07-26 VITALS — BP 121/52 | HR 60 | Temp 96.8°F | Ht 67.0 in | Wt 141.8 lb

## 2020-07-26 DIAGNOSIS — D631 Anemia in chronic kidney disease: Secondary | ICD-10-CM | POA: Diagnosis not present

## 2020-07-26 DIAGNOSIS — R195 Other fecal abnormalities: Secondary | ICD-10-CM | POA: Diagnosis not present

## 2020-07-26 DIAGNOSIS — M479 Spondylosis, unspecified: Secondary | ICD-10-CM | POA: Diagnosis not present

## 2020-07-26 DIAGNOSIS — K219 Gastro-esophageal reflux disease without esophagitis: Secondary | ICD-10-CM | POA: Diagnosis not present

## 2020-07-26 DIAGNOSIS — R198 Other specified symptoms and signs involving the digestive system and abdomen: Secondary | ICD-10-CM | POA: Diagnosis not present

## 2020-07-26 DIAGNOSIS — I5043 Acute on chronic combined systolic (congestive) and diastolic (congestive) heart failure: Secondary | ICD-10-CM | POA: Diagnosis not present

## 2020-07-26 DIAGNOSIS — R109 Unspecified abdominal pain: Secondary | ICD-10-CM | POA: Diagnosis not present

## 2020-07-26 DIAGNOSIS — I130 Hypertensive heart and chronic kidney disease with heart failure and stage 1 through stage 4 chronic kidney disease, or unspecified chronic kidney disease: Secondary | ICD-10-CM | POA: Diagnosis not present

## 2020-07-26 DIAGNOSIS — K2100 Gastro-esophageal reflux disease with esophagitis, without bleeding: Secondary | ICD-10-CM | POA: Diagnosis not present

## 2020-07-26 DIAGNOSIS — D649 Anemia, unspecified: Secondary | ICD-10-CM | POA: Diagnosis not present

## 2020-07-26 DIAGNOSIS — R69 Illness, unspecified: Secondary | ICD-10-CM | POA: Diagnosis not present

## 2020-07-26 DIAGNOSIS — I4821 Permanent atrial fibrillation: Secondary | ICD-10-CM | POA: Diagnosis not present

## 2020-07-26 DIAGNOSIS — H409 Unspecified glaucoma: Secondary | ICD-10-CM | POA: Diagnosis not present

## 2020-07-26 DIAGNOSIS — Z48812 Encounter for surgical aftercare following surgery on the circulatory system: Secondary | ICD-10-CM | POA: Diagnosis not present

## 2020-07-26 DIAGNOSIS — N1832 Chronic kidney disease, stage 3b: Secondary | ICD-10-CM | POA: Diagnosis not present

## 2020-07-26 MED ORDER — PANTOPRAZOLE SODIUM 40 MG PO TBEC
40.0000 mg | DELAYED_RELEASE_TABLET | Freq: Every day | ORAL | 3 refills | Status: DC
Start: 2020-07-26 — End: 2021-09-30

## 2020-07-26 NOTE — Patient Instructions (Signed)
Please have labs completed at [facility].   Complete IFOBT and return to our office.   Stop overt the counter laxative and overt the counter anti diarrheal pills.   Start Benefiber 3 teaspoons daily x2 weeks, then increase to twice daily.   Start Colace 100 mg daily.   Call with a progress report on constipation/diarrhea in 2 weeks.   Start taking Protonix 40 mg every day 30 minutes before breakfast. I am sending a refill to your pharmacy.   Follow a GERD diet:  Avoid fried, fatty, greasy, spicy, citrus foods. Avoid caffeine and carbonated beverages. Avoid chocolate. Try eating 4-6 small meals a day rather than 3 large meals. Do not eat within 3 hours of laying down. Prop head of bed up on wood or bricks to create a 6 inch incline.  We will plan to see you back in 4-6 weeks.    Aliene Altes, PA-C [facility]

## 2020-07-27 ENCOUNTER — Telehealth: Payer: Self-pay | Admitting: Cardiovascular Disease

## 2020-07-27 NOTE — Telephone Encounter (Signed)
Spoke with pt, she does not need any medications at this time.

## 2020-07-27 NOTE — Telephone Encounter (Signed)
    Pt wanted to put her new pharmacy [employer] [facility]  Phone# [phone] Customer service # [phone] Pt said to send all her future refills at this pharmacy and delete [facility] on her file. She said all emergency refill can be sent to [facility]

## 2020-07-29 ENCOUNTER — Telehealth: Payer: Self-pay | Admitting: Cardiology

## 2020-07-29 NOTE — Telephone Encounter (Signed)
Received call from patient with increased LE edema and dyspnea. Review of office visit from 3/18 she was noted to be volume overloaded and instructed to increase lasix for 2 days then return to normal dose of 40mg  daily.   She says increasing her lasix dose for those couple of days did help. Has actually been only taking 20mg  daily most recently. Has had increasing LE edema and woke up this morning with dyspnea. No chest pain. Does not weigh herself on a regular basis. I advised she should increase her dose of lasix back up to 40mg  daily for next 2 days. Suspect she may need to continue on this dosing given her build up of fluid. Will route to PCP cardiologist and NL triage to follow up with her ( may need repeat labs later this week as Cr was elevated at 1.74 baseline on 3/18). Advised if symptoms do not improve or worsen with attempted increase in lasix, she would need to seek medical care and be evaluated in the closest ER. She was agreeable to this plan.

## 2020-07-29 NOTE — Telephone Encounter (Signed)
Actually, the plan after her last office visit was for her to stay on furosemide 40 mg daily indefinitely.

## 2020-07-30 ENCOUNTER — Telehealth: Payer: Self-pay | Admitting: Cardiovascular Disease

## 2020-07-30 DIAGNOSIS — I5043 Acute on chronic combined systolic (congestive) and diastolic (congestive) heart failure: Secondary | ICD-10-CM | POA: Diagnosis not present

## 2020-07-30 DIAGNOSIS — I130 Hypertensive heart and chronic kidney disease with heart failure and stage 1 through stage 4 chronic kidney disease, or unspecified chronic kidney disease: Secondary | ICD-10-CM | POA: Diagnosis not present

## 2020-07-30 DIAGNOSIS — Z48812 Encounter for surgical aftercare following surgery on the circulatory system: Secondary | ICD-10-CM | POA: Diagnosis not present

## 2020-07-30 DIAGNOSIS — K2100 Gastro-esophageal reflux disease with esophagitis, without bleeding: Secondary | ICD-10-CM | POA: Diagnosis not present

## 2020-07-30 DIAGNOSIS — N1832 Chronic kidney disease, stage 3b: Secondary | ICD-10-CM | POA: Diagnosis not present

## 2020-07-30 DIAGNOSIS — M479 Spondylosis, unspecified: Secondary | ICD-10-CM | POA: Diagnosis not present

## 2020-07-30 DIAGNOSIS — R69 Illness, unspecified: Secondary | ICD-10-CM | POA: Diagnosis not present

## 2020-07-30 DIAGNOSIS — I4821 Permanent atrial fibrillation: Secondary | ICD-10-CM | POA: Diagnosis not present

## 2020-07-30 DIAGNOSIS — H409 Unspecified glaucoma: Secondary | ICD-10-CM | POA: Diagnosis not present

## 2020-07-30 DIAGNOSIS — D631 Anemia in chronic kidney disease: Secondary | ICD-10-CM | POA: Diagnosis not present

## 2020-07-30 NOTE — Telephone Encounter (Signed)
Follow Up:    Pt says he follow Dr C instruction about her medicine(Furosemide) and she still have a lot of swelling in her ankles and legs.   Pt c/o swelling: STAT is pt has developed SOB within 24 hours  1) How much weight have you gained and in what time span?  Did not know, had not weighed herself 2)  3) If swelling, where is the swelling located?  Ankles and legs  4) Are you currently taking a fluid pill? yes  5) Are you currently SOB?always  6) Do you have a log of your daily weights (if so, list)? no  7) Have you gained 3 pounds in a day or 5 pounds in a week? no  8) Have you traveled recently? no

## 2020-07-30 NOTE — Telephone Encounter (Signed)
The patient called in with complaints of swelling. She had called yesterday to the on call with the same complaints. She stated that she did not call yesterday but called a few days ago and was told to take Furosemide 40 mg for three days and then go back to 20 mg daily.  She has been advised that she saw Dr. Sallyanne Kuster on 3/18 and was told to take 40 mg once daily and that she had called yesterday as well but she did not remember that.   She has been advised to take the 40 mg once daily but then stated she was concerned about how much she would have to use the restroom. She has been advised that this will help with the swelling and if she is using the bathroom more than that means the fluid is coming off.   She has also been advised that she has an appointment on 4/1 and transportation was going to get her for this appointment. She has been advised on the importance of coming.   She was asked if she needed help at home to help keep her medications straight but she declined.

## 2020-07-31 DIAGNOSIS — R0689 Other abnormalities of breathing: Secondary | ICD-10-CM | POA: Diagnosis not present

## 2020-07-31 DIAGNOSIS — M4186 Other forms of scoliosis, lumbar region: Secondary | ICD-10-CM | POA: Diagnosis not present

## 2020-07-31 DIAGNOSIS — R519 Headache, unspecified: Secondary | ICD-10-CM | POA: Diagnosis not present

## 2020-07-31 DIAGNOSIS — Z743 Need for continuous supervision: Secondary | ICD-10-CM | POA: Diagnosis not present

## 2020-07-31 DIAGNOSIS — Z950 Presence of cardiac pacemaker: Secondary | ICD-10-CM | POA: Diagnosis not present

## 2020-07-31 DIAGNOSIS — M47896 Other spondylosis, lumbar region: Secondary | ICD-10-CM | POA: Diagnosis not present

## 2020-07-31 DIAGNOSIS — M545 Low back pain, unspecified: Secondary | ICD-10-CM | POA: Diagnosis not present

## 2020-07-31 DIAGNOSIS — I509 Heart failure, unspecified: Secondary | ICD-10-CM | POA: Diagnosis not present

## 2020-07-31 DIAGNOSIS — I700 Atherosclerosis of aorta: Secondary | ICD-10-CM | POA: Diagnosis not present

## 2020-07-31 DIAGNOSIS — I10 Essential (primary) hypertension: Secondary | ICD-10-CM | POA: Diagnosis not present

## 2020-07-31 DIAGNOSIS — G8929 Other chronic pain: Secondary | ICD-10-CM | POA: Diagnosis not present

## 2020-07-31 DIAGNOSIS — I110 Hypertensive heart disease with heart failure: Secondary | ICD-10-CM | POA: Diagnosis not present

## 2020-07-31 DIAGNOSIS — R0602 Shortness of breath: Secondary | ICD-10-CM | POA: Diagnosis not present

## 2020-07-31 DIAGNOSIS — I517 Cardiomegaly: Secondary | ICD-10-CM | POA: Diagnosis not present

## 2020-07-31 DIAGNOSIS — M5136 Other intervertebral disc degeneration, lumbar region: Secondary | ICD-10-CM | POA: Diagnosis not present

## 2020-07-31 NOTE — Progress Notes (Signed)
CC'ED TO PCP 

## 2020-08-01 NOTE — Progress Notes (Signed)
Cardiology Office Note   Date:  08/03/2020   ID:  Kimberly [name], DOB [birth year], MRN [id number]  PCP:  Curlene Labrum, MD  Cardiologist: Dr. Sallyanne Kuster No chief complaint on file.    History of Present Illness: Kimberly [name] is a 87 y.o. female who presents for ongoing assessment and management of permanent atrial fibrillation status post AV nodal ablation and secondary complete heart block with CRT-P device with recent generator change on 06/19/2020.  She also has a history of mild diastolic heart failure with last EF of 55 to 60% by echo in 2021.  Seen by Dr. Sallyanne Kuster on 07/20/2020 with complaints of exertional dyspnea and leg edema which did improve after increased dose of diuretic.  Due to recent generator change use of OptiVol was not allowed as needed by the site was healing.  For lower extremity edema she was to take 80 mg of Lasix for 2 days (Saturday and Sunday) and then go back to 40 mg daily.  She was to take additional doses of potassium (20 mEq) on Saturday and Sunday with the higher dose of Lasix and then go back to her normal regimen.  Follow-up BNP and BMET were ordered.  Creatinine increased from 1.48-1.74, BNP was 260.2.  On this follow-up visit today she states that her breathing status has not changed she is still very short of breath and tired when she does minimal exertion.  She states she lives in a small house and just walking through her home makes her very tired and it takes her a while to recover when she sits down.  She denies coughing but does complain of two-pillow orthopnea.  She has reduced her dose of Lasix back down to 40 mg daily.  She has not lost any weight.  She continues to have lower extremity edema.  She states that she has fallen once and did injure her right pretibial area with a small laceration which is healing well.  She uses a walker for ambulation or uses furniture for support.  Past Medical History:  Diagnosis Date  . Acute blood loss anemia 10/11/2012   . Acute diverticulitis 08/24/2013  . Acute on chronic combined systolic and diastolic CHF, NYHA class 4 ([place]) 11/15/2013  . Antral ulcer 10/11/2012  . Arrhythmia    atrial fibb  . Atrial fibrillation ([place])   . Bipolar affective disorder ([place]) 03/23/2017  . Cardiomyopathy, nonischemic ([place])   . Chronic anticoagulation 10/12/2012  . CKD (chronic kidney disease) stage 3, GFR 30-59 ml/min ([facility]) 10/12/2012  . Depression   . Erosive esophagitis 10/11/2012  . Fibromyalgia   . Glaucoma   . H/O echocardiogram 2007   EF 40-45%,         . Hypertension   . Osteoarthritis   . Pacemaker    Last saw cards 07/2013  . Scoliosis     Past Surgical History:  Procedure Laterality Date  . ABDOMINAL HYSTERECTOMY    . APPENDECTOMY    . BACK SURGERY    . BIOPSY  03/03/2017   Procedure: BIOPSY;  Surgeon: Danie Binder, MD;  [facility];  Service: Endoscopy;;  gastric  . BREAST SURGERY    . CARDIAC CATHETERIZATION  12/08/2005   LAD AND LEFT MAIN WITH NO HIGH-GRADE STENOSIS. MILD DISEASE IN THE CX AND LAD SYSTEM. SEVERE LV DYSFUNCTION WITH DILATION OF THE LV. EF 15-20%. LV END-DIASTOLIC PRESSURE IS 90. +1 MR.  . CHOLECYSTECTOMY    . COLONOSCOPY N/A 03/03/2017  Dr. Oneida Alar: 5 colon polyps removed, adenomatous.  Diverticulosis.  99% of the colon was cleared but the cecum was not adequately seen.  . CYSTOSCOPY N/A 02/24/2013   Procedure: CYSTOSCOPY WITH URETHRAL DILITATION;  Surgeon: Marissa Nestle, MD;  Location: [facility];  Service: Urology;  Laterality: N/A;  . DOPPLER ECHOCARDIOGRAPHY N/A 05/30/2010   LV SIZE IS NORMAL. LV SYSTOLIC FUNCTION IS LOW NORMAL. EF=50-55%. MILD INFERIOR HYPOKINESIS.MILD TO MODERATE POSTERIOR WALL HYPOKINESIS.PACEMAKER LEAD IN THE RV. LA IS MILDLY DILATED. RA IS MODERATE TO SEVERLY DILATED. PACEMAKER LEAD IN THE RA. MILD CALCICICATION OF THE MV APPARATUS. MODERATE MR. MILD TO MODERATE TR. MILD PHTN.AV MILDLY SCLEROTIC.  [name] ESOPHAGOGASTRODUODENOSCOPY N/A 10/13/2012   Dr.  Gala Romney: severe ulcerative reflux esophagitis, question of Barrett's but negative path, single deep prepyloric antral ulcer, negative H.pylori  . ESOPHAGOGASTRODUODENOSCOPY N/A 03/03/2017   Dr. Oneida Alar: Gastritis/duodenitis, no H. pylori  . HERNIA REPAIR     right inguinal hernia and umbilical  . LOWETR EXT VENOUS Bilateral 11-08-10   R & L- NO EVIDENCE OF THROMBUS OR THROMBOPHLEBITIS. THERE IS MILD AMOUNT OF SUBCUTANEOUS EDEMA NOTED WITHIN THE LEFT CALF AND ANKLE. R & L GSV AND SSV- NO VENOUS INSUFF NOTED.  [name] NECK SURGERY    . NUCLEAR STRESS TEST N/A 02/13/2009   NORMAL PATTERN OF PERFUSION IN ALL REGIONS. POST STRESS VENTICULAR SIZE IS NORMAL. POST  STESS EF 85%.  NORMAL MYOCARDIAL PERFUSION STUDY.  [name] OPEN REDUCTION INTERNAL FIXATION (ORIF) DISTAL PHALANX Left 11/16/2018   Procedure: MIDDLE FINGER OPEN REDUCTION VERSUS RECONSTRUCTION;  Surgeon: Roseanne Kaufman, MD;  Location: [facility];  Service: Orthopedics;  Laterality: Left;  . PACEMAKER INSERTION    . POLYPECTOMY  03/03/2017   Procedure: POLYPECTOMY;  Surgeon: Danie Binder, MD;  [facility];  Service: Endoscopy;;  colon  . PPM GENERATOR CHANGEOUT N/A 06/18/2020   Procedure: PPM GENERATOR CHANGEOUT;  Surgeon: Sanda Klein, MD;  Location: [facility];  Service: Cardiovascular;  Laterality: N/A;  . TONSILLECTOMY    . YAG LASER APPLICATION Bilateral 8/36/6294   Procedure: YAG LASER APPLICATION;  Surgeon: Williams Che, MD;  Location: [facility];  Service: Ophthalmology;  Laterality: Bilateral;     Current Outpatient Medications  Medication Sig Dispense Refill  . acetaminophen (TYLENOL) 325 MG tablet Take 325 mg by mouth as needed for headache.    [name] apixaban (ELIQUIS) 2.5 MG TABS tablet Take 2.5 mg by mouth 2 (two) times daily.    [name] CALCIUM CARBONATE ANTACID PO Take 1 tablet by mouth daily as needed for heartburn or indigestion.    . carvedilol (COREG) 6.25 MG tablet Take 6.25 mg by mouth 2 (two) times daily with a meal.     . furosemide (LASIX) 40 MG tablet Take 1.5 tablets (60 mg total) by mouth daily. 135 tablet 3  . Multiple Vitamins-Minerals (PRESERVISION AREDS 2) CAPS Take 1 capsule by mouth 2 (two) times daily.     . pantoprazole (PROTONIX) 40 MG tablet Take 1 tablet (40 mg total) by mouth daily before breakfast. 30 tablet 3  . Polyethylene Glycol 400 (BLINK TEARS) 0.25 % SOLN Place 1 drop into both eyes daily as needed (for dry eye relief).     No current facility-administered medications for this visit.    Allergies:   Ciprofloxacin, Flagyl [metronidazole], Papaya derivatives, Iodine, Penicillins, and Sulfa antibiotics    Social History:  The patient  reports that she quit smoking about 15 years ago. Her smoking use included cigarettes.  She has a 15.00 pack-year smoking history. She has never used smokeless tobacco. She reports current alcohol use. She reports that she does not use drugs.   Family History:  The patient's family history includes Asthma in her sister; Cancer in her brother and sister; Heart failure in her brother; Pulmonary embolism in her brother.    ROS: All other systems are reviewed and negative. Unless otherwise mentioned in H&P    PHYSICAL EXAM: VS:  BP (!) 158/70   Pulse 60   Ht 5\' 7"  (1.702 m)   Wt 141 lb (64 kg)   BMI 22.08 kg/m  , BMI Body mass index is 22.08 kg/m. GEN: Well nourished, well developed, in no acute distress HEENT: normal Neck: no JVD, carotid bruits, or masses Cardiac: RRR; no murmurs, rubs, or gallops, 2+ pitting dependent edema with laceration on the right pretibial area occurring after she scraped her leg. Respiratory: Bilateral crackles in the bases left greater than right  GI: soft, nontender, nondistended, + BS MS: no deformity or atrophy Skin: warm and dry, no rash Neuro:  Strength and sensation are intact Psych: euthymic mood, full affect   EKG: Not completed this office visit.  Pacemaker interrogation was completed however please see  separate report along with OptiVol.  Recent Labs: 06/19/2020: ALT 13; Magnesium 2.2 07/05/2020: Hemoglobin 10.9; Platelets 209 07/20/2020: BNP 260.2; BUN 27; Creatinine, Ser 1.74; Potassium 4.3; Sodium 140    Lipid Panel    Component Value Date/Time   CHOL 179 03/15/2018 1645   TRIG 65 03/15/2018 1645   HDL 84 03/15/2018 1645   CHOLHDL 2.1 03/15/2018 1645   CHOLHDL 2.3 02/06/2009 0450   VLDL 16 02/06/2009 0450   LDLCALC 82 03/15/2018 1645      Wt Readings from Last 3 Encounters:  08/03/20 141 lb (64 kg)  07/26/20 141 lb 12.8 oz (64.3 kg)  07/20/20 139 lb 3.2 oz (63.1 kg)      Other studies Reviewed: ECHO 11/21/2019   1. Left ventricular ejection fraction, by estimation, is 55 to 60%. The  left ventricle has normal function. The left ventricle has no regional  wall motion abnormalities. Left ventricular diastolic function could not  be evaluated.  2. Right ventricular systolic function is normal. The right ventricular  size is normal. There is mildly elevated pulmonary artery systolic  pressure. The estimated right ventricular systolic pressure is 78.2 mmHg.  3. Left atrial size was severely dilated.  4. Right atrial size was severely dilated.  5. The mitral valve is degenerative. Mild mitral valve regurgitation. No  evidence of mitral stenosis.  6. The aortic valve is tricuspid. Aortic valve regurgitation is not  visualized. Mild to moderate aortic valve sclerosis/calcification is  present, without any evidence of aortic stenosis.  7. The inferior vena cava is normal in size with greater than 50%  respiratory variability, suggesting right atrial pressure of 3 mmHg.   Comparison(s): No significant change from prior study. Prior EF 60-65%  (2016).    ASSESSMENT AND PLAN:  1.  Chronic systolic CHF: She continues to have some shortness of breath and some mild crackles in the bases.  She also has some dependent edema in the legs bilaterally.  She had increased her  Lasix to 80 mg for 2 days and then reduced it back to 40 mg daily as directed.  I am going to go back up on her Lasix to 60 mg daily to assist with fluid retention as her creatinine did rise temporarily with  the 80 mg dose.  She will have a BMET in 3 weeks and be seen again in a month.  OptiVol measurements were done prematurely today as she has only recently have this place since early March 2022.  Thoracic impedance was approximately 75 ohms.  Which is very close to her own baseline.  2.  Pacemaker in situ: This was interrogated by Dr.Crotoru who adjusted her pacemaker parameters from VVI to allow for greater heart rate with exertion instead of pacing at 60 bpm.  This should help her with her breathing status along with increased dose of diuretic.  Will have her follow-up in 1 month to evaluate her response to changes in pacemaker.  3. Hypertension: Blood pressure is slightly elevated today.  We will check her blood pressure again on next office visit with increased dose of diuretic.  We will continue carvedilol and furosemide at higher dose as directed.   4.  Permanent atrial fibrillation: She continues on rate control with carvedilol as well as anticoagulation with apixaban 2.5 mg twice daily.  She states she is fallen once which is of concern and should be monitored closely in the setting of anticoagulation therapy.  Due to her age, frailty, she may be at a higher risk for bleeding issues should she fall again.  If breathing status is unchanged we will check a CBC to evaluate for anemia.   Current medicines are reviewed at length with the patient today.  I have spent 30 mins dedicated to the care of this patient on the date of this encounter to include pre-visit review of records, assessment, management and diagnostic testing,with shared decision making.  Dr. Sallyanne Kuster  has also seen the patient today for pacemaker interrogation and parameter change.   Labs/ tests ordered today include: BMET Kimberly [name]. Kimberly [name], ANP, AACC   08/03/2020 3:55 PM    [facility] [place] [address] Office [phone] Fax [phone]  Notice: This dictation was prepared with Dragon dictation along with smaller phrase technology. Any transcriptional errors that result from this process are unintentional and may not be corrected upon review.

## 2020-08-03 ENCOUNTER — Encounter: Payer: Self-pay | Admitting: Adult Health

## 2020-08-03 ENCOUNTER — Other Ambulatory Visit: Payer: Self-pay

## 2020-08-03 ENCOUNTER — Ambulatory Visit (INDEPENDENT_AMBULATORY_CARE_PROVIDER_SITE_OTHER): Payer: Medicare HMO | Admitting: Adult Health

## 2020-08-03 ENCOUNTER — Ambulatory Visit: Payer: Medicare HMO | Admitting: Adult Health

## 2020-08-03 VITALS — BP 158/70 | HR 60 | Ht 67.0 in | Wt 141.0 lb

## 2020-08-03 DIAGNOSIS — Z950 Presence of cardiac pacemaker: Secondary | ICD-10-CM

## 2020-08-03 DIAGNOSIS — I10 Essential (primary) hypertension: Secondary | ICD-10-CM

## 2020-08-03 DIAGNOSIS — I4821 Permanent atrial fibrillation: Secondary | ICD-10-CM | POA: Diagnosis not present

## 2020-08-03 DIAGNOSIS — R0602 Shortness of breath: Secondary | ICD-10-CM

## 2020-08-03 LAB — PACEMAKER DEVICE OBSERVATION

## 2020-08-03 MED ORDER — FUROSEMIDE 40 MG PO TABS: 40.0000 mg | ORAL_TABLET | Freq: Every day | ORAL | 3 refills | Status: DC

## 2020-08-03 MED ORDER — FUROSEMIDE 40 MG PO TABS: 60.0000 mg | ORAL_TABLET | Freq: Every day | ORAL | 3 refills | Status: DC

## 2020-08-03 NOTE — Patient Instructions (Addendum)
Medication Instructions:  INCREASE- Furosemide(lasix) 60 mg(1 1/2 tablet) by mouth daily  *If you need a refill on your cardiac medications before your next appointment, please call your pharmacy*   Lab Work: BMP in 1  Month  If you have labs (blood work) drawn today and your tests are completely normal, you will receive your results only by: [name] MyChart Message (if you have MyChart) OR . A paper copy in the mail If you have any lab test that is abnormal or we need to change your treatment, we will call you to review the results.   Testing/Procedures: None Ordered   Follow-Up: At [facility], you and your health needs are our priority.  As part of our continuing mission to provide you with exceptional heart care, we have created designated Provider Care Teams.  These Care Teams include your primary Cardiologist (physician) and Advanced Practice Providers (APPs -  Physician Assistants and Nurse Practitioners) who all work together to provide you with the care you need, when you need it.  We recommend signing up for the patient portal called "MyChart".  Sign up information is provided on this After Visit Summary.  MyChart is used to connect with patients for Virtual Visits (Telemedicine).  Patients are able to view lab/test results, encounter notes, upcoming appointments, etc.  Non-urgent messages can be sent to your provider as well.   To learn more about what you can do with MyChart, go to [email or web address].    Your next appointment:   1 month(s)  The format for your next appointment:   In Person  Provider:   Sanda Klein, MD or Jory Sims, DNP

## 2020-08-03 NOTE — Progress Notes (Signed)
Pacemaker interrogated. Device reprogrammed to VVIR (R was inadvertently off since generator change Feb 14). This may improve exercise tolerance. Optivol is still difficult to interpret, but suggests fluid is probably close to baseline, some extra fluid build up in last few days. Recommend furosemide 40 mg daily indefinitely.

## 2020-08-07 ENCOUNTER — Telehealth: Payer: Self-pay | Admitting: Cardiovascular Disease

## 2020-08-07 NOTE — Telephone Encounter (Signed)
   [facility] Pre-operative Risk Assessment    Patient Name: Kimberly [name]  DOB: 17-Sep-1926  MRN: [id number]   HEARTCARE STAFF: - Please ensure there is not already an duplicate clearance open for this procedure. - Under Visit Info/Reason for Call, type in Other and utilize the format Clearance MM/DD/YY or Clearance TBD. Do not use dashes or single digits. - If request is for dental extraction, please clarify the # of teeth to be extracted.  Request for surgical clearance:  1. What type of surgery is being performed? Tooth Extraction  2. When is this surgery scheduled? 08/14/20  3. What type of clearance is required (medical clearance vs. Pharmacy clearance to hold med vs. Both)? Both  4. Are there any medications that need to be held prior to surgery and how long? Eliquis; depends on Dr. Sallyanne Kuster  5. Practice name and name of physician performing surgery? [facility]; Dr. Cicero Duck   6. What is the office phone number? [phone]   7.   What is the office fax number? [phone]  8.   Anesthesia type (None, local, MAC, general) ? local   Durel Salts 08/07/2020, 11:19 AM  _________________________________________________________________   (provider comments below)

## 2020-08-07 NOTE — Telephone Encounter (Signed)
   Patient Name: Kimberly [name]  DOB: October 04, 1926  MRN: [id number]   Primary Cardiologist: Sanda Klein, MD  Chart reviewed as part of pre-operative protocol coverage. Simple dental extractions are considered low risk procedures per guidelines and generally do not require any specific cardiac clearance. It is also generally accepted that for simple extractions and dental cleanings, there is no need to interrupt blood thinner therapy.  SBE prophylaxis is not required for the patient from a cardiac standpoint.  I will route this recommendation to the requesting party via Epic fax function and remove from pre-op pool.  Please call with questions.  Darreld Mclean, PA-C 08/07/2020, 11:35 AM

## 2020-08-09 DIAGNOSIS — D631 Anemia in chronic kidney disease: Secondary | ICD-10-CM | POA: Diagnosis not present

## 2020-08-09 DIAGNOSIS — I4821 Permanent atrial fibrillation: Secondary | ICD-10-CM | POA: Diagnosis not present

## 2020-08-09 DIAGNOSIS — R69 Illness, unspecified: Secondary | ICD-10-CM | POA: Diagnosis not present

## 2020-08-09 DIAGNOSIS — K2100 Gastro-esophageal reflux disease with esophagitis, without bleeding: Secondary | ICD-10-CM | POA: Diagnosis not present

## 2020-08-09 DIAGNOSIS — I130 Hypertensive heart and chronic kidney disease with heart failure and stage 1 through stage 4 chronic kidney disease, or unspecified chronic kidney disease: Secondary | ICD-10-CM | POA: Diagnosis not present

## 2020-08-09 DIAGNOSIS — H409 Unspecified glaucoma: Secondary | ICD-10-CM | POA: Diagnosis not present

## 2020-08-09 DIAGNOSIS — M479 Spondylosis, unspecified: Secondary | ICD-10-CM | POA: Diagnosis not present

## 2020-08-09 DIAGNOSIS — Z48812 Encounter for surgical aftercare following surgery on the circulatory system: Secondary | ICD-10-CM | POA: Diagnosis not present

## 2020-08-09 DIAGNOSIS — I5043 Acute on chronic combined systolic (congestive) and diastolic (congestive) heart failure: Secondary | ICD-10-CM | POA: Diagnosis not present

## 2020-08-09 DIAGNOSIS — N1832 Chronic kidney disease, stage 3b: Secondary | ICD-10-CM | POA: Diagnosis not present

## 2020-08-13 ENCOUNTER — Encounter: Payer: Medicare HMO | Admitting: Cardiovascular Disease

## 2020-08-16 DIAGNOSIS — N183 Chronic kidney disease, stage 3 unspecified: Secondary | ICD-10-CM | POA: Diagnosis not present

## 2020-08-16 DIAGNOSIS — M1A9XX1 Chronic gout, unspecified, with tophus (tophi): Secondary | ICD-10-CM | POA: Diagnosis not present

## 2020-08-16 DIAGNOSIS — M479 Spondylosis, unspecified: Secondary | ICD-10-CM | POA: Diagnosis not present

## 2020-08-16 DIAGNOSIS — N179 Acute kidney failure, unspecified: Secondary | ICD-10-CM | POA: Diagnosis not present

## 2020-08-16 DIAGNOSIS — I130 Hypertensive heart and chronic kidney disease with heart failure and stage 1 through stage 4 chronic kidney disease, or unspecified chronic kidney disease: Secondary | ICD-10-CM | POA: Diagnosis not present

## 2020-08-16 DIAGNOSIS — H409 Unspecified glaucoma: Secondary | ICD-10-CM | POA: Diagnosis not present

## 2020-08-16 DIAGNOSIS — K2100 Gastro-esophageal reflux disease with esophagitis, without bleeding: Secondary | ICD-10-CM | POA: Diagnosis not present

## 2020-08-16 DIAGNOSIS — I4821 Permanent atrial fibrillation: Secondary | ICD-10-CM | POA: Diagnosis not present

## 2020-08-16 DIAGNOSIS — R079 Chest pain, unspecified: Secondary | ICD-10-CM | POA: Diagnosis not present

## 2020-08-16 DIAGNOSIS — D631 Anemia in chronic kidney disease: Secondary | ICD-10-CM | POA: Diagnosis not present

## 2020-08-16 DIAGNOSIS — Z950 Presence of cardiac pacemaker: Secondary | ICD-10-CM | POA: Diagnosis not present

## 2020-08-16 DIAGNOSIS — N1832 Chronic kidney disease, stage 3b: Secondary | ICD-10-CM | POA: Diagnosis not present

## 2020-08-16 DIAGNOSIS — I5043 Acute on chronic combined systolic (congestive) and diastolic (congestive) heart failure: Secondary | ICD-10-CM | POA: Diagnosis not present

## 2020-08-16 DIAGNOSIS — K2210 Ulcer of esophagus without bleeding: Secondary | ICD-10-CM | POA: Diagnosis not present

## 2020-08-16 DIAGNOSIS — R0609 Other forms of dyspnea: Secondary | ICD-10-CM | POA: Diagnosis not present

## 2020-08-16 DIAGNOSIS — I442 Atrioventricular block, complete: Secondary | ICD-10-CM | POA: Diagnosis not present

## 2020-08-16 DIAGNOSIS — D649 Anemia, unspecified: Secondary | ICD-10-CM | POA: Diagnosis not present

## 2020-08-16 DIAGNOSIS — N189 Chronic kidney disease, unspecified: Secondary | ICD-10-CM | POA: Diagnosis not present

## 2020-08-16 DIAGNOSIS — R69 Illness, unspecified: Secondary | ICD-10-CM | POA: Diagnosis not present

## 2020-08-16 DIAGNOSIS — Z48812 Encounter for surgical aftercare following surgery on the circulatory system: Secondary | ICD-10-CM | POA: Diagnosis not present

## 2020-08-19 NOTE — Progress Notes (Deleted)
Cardiology Office Note Date:  08/19/2020  Patient ID:  Kimberly [name], Kimberly [name] 1926/11/10, MRN [id number] PCP:  Curlene Labrum, MD  Cardiologist: Dr. Sallyanne Kuster  ***refresh   Chief Complaint: *** ???  History of Present Illness: Kimberly [name] is a 87 y.o. female with history of permanent AFib, CHB s/p AV node ablation w/CRT-P, chronic CHF (diastolic), scoliosis, fibromyalgia, CKD (IIIb), HTN, RA   She comes in today to be seen for dr. Sallyanne Kuster, last seen by him 07/20/20, she was doing ok, suspect to be mildly volume OL, labs confirmed and diuretics adjusted for a couple days. Discussed low dose eliquis 2/2 advanced age, body habitus and volatile renal function  08/03/20 she saw K. Lawrence, NP still c/o SOB and edematous.  Lasix again adjusted on a short term basis, Dr. Sallyanne Kuster at this visit reprogrammed her device to VVIR in hopes to have better HR excursion with ambulation/activity as well.  *** symptoms *** volume *** meds CM *** bleeding eliquis   Device information MDT CRT-P, initial device an ICD Implanted 1997 >> CS lead added 2007 >> 2012 gen change downgraded to CRT-P >> gen change 06/18/2020   Past Medical History:  Diagnosis Date  . Acute blood loss anemia 10/11/2012  . Acute diverticulitis 08/24/2013  . Acute on chronic combined systolic and diastolic CHF, NYHA class 4 ([facility]) 11/15/2013  . Antral ulcer 10/11/2012  . Arrhythmia    atrial fibb  . Atrial fibrillation ([facility] [place])   . Bipolar affective disorder ([place]) 03/23/2017  . Cardiomyopathy, nonischemic ([facility])   . Chronic anticoagulation 10/12/2012  . CKD (chronic kidney disease) stage 3, GFR 30-59 ml/min ([facility]) 10/12/2012  . Depression   . Erosive esophagitis 10/11/2012  . Fibromyalgia   . Glaucoma   . H/O echocardiogram 2007   EF 40-45%,         . Hypertension   . Osteoarthritis   . Pacemaker    Last saw cards 07/2013  . Scoliosis     Past Surgical History:  Procedure Laterality Date  . ABDOMINAL HYSTERECTOMY    .  APPENDECTOMY    . BACK SURGERY    . BIOPSY  03/03/2017   Procedure: BIOPSY;  Surgeon: Danie Binder, MD;  [facility];  Service: Endoscopy;;  gastric  . BREAST SURGERY    . CARDIAC CATHETERIZATION  12/08/2005   LAD AND LEFT MAIN WITH NO HIGH-GRADE STENOSIS. MILD DISEASE IN THE CX AND LAD SYSTEM. SEVERE LV DYSFUNCTION WITH DILATION OF THE LV. EF 15-20%. LV END-DIASTOLIC PRESSURE IS 90. +1 MR.  . CHOLECYSTECTOMY    . COLONOSCOPY N/A 03/03/2017   Dr. Oneida Alar: 5 colon polyps removed, adenomatous.  Diverticulosis.  99% of the colon was cleared but the cecum was not adequately seen.  . CYSTOSCOPY N/A 02/24/2013   Procedure: CYSTOSCOPY WITH URETHRAL DILITATION;  Surgeon: Marissa Nestle, MD;  Location: [facility];  Service: Urology;  Laterality: N/A;  . DOPPLER ECHOCARDIOGRAPHY N/A 05/30/2010   LV SIZE IS NORMAL. LV SYSTOLIC FUNCTION IS LOW NORMAL. EF=50-55%. MILD INFERIOR HYPOKINESIS.MILD TO MODERATE POSTERIOR WALL HYPOKINESIS.PACEMAKER LEAD IN THE RV. LA IS MILDLY DILATED. RA IS MODERATE TO SEVERLY DILATED. PACEMAKER LEAD IN THE RA. MILD CALCICICATION OF THE MV APPARATUS. MODERATE MR. MILD TO MODERATE TR. MILD PHTN.AV MILDLY SCLEROTIC.  [name] ESOPHAGOGASTRODUODENOSCOPY N/A 10/13/2012   Dr. Gala Romney: severe ulcerative reflux esophagitis, question of Barrett's but negative path, single deep prepyloric antral ulcer, negative H.pylori  . ESOPHAGOGASTRODUODENOSCOPY N/A 03/03/2017   Dr. Oneida Alar: Gastritis/duodenitis,  no H. pylori  . HERNIA REPAIR     right inguinal hernia and umbilical  . LOWETR EXT VENOUS Bilateral 11-08-10   R & L- NO EVIDENCE OF THROMBUS OR THROMBOPHLEBITIS. THERE IS MILD AMOUNT OF SUBCUTANEOUS EDEMA NOTED WITHIN THE LEFT CALF AND ANKLE. R & L GSV AND SSV- NO VENOUS INSUFF NOTED.  [name] NECK SURGERY    . NUCLEAR STRESS TEST N/A 02/13/2009   NORMAL PATTERN OF PERFUSION IN ALL REGIONS. POST STRESS VENTICULAR SIZE IS NORMAL. POST  STESS EF 85%.  NORMAL MYOCARDIAL PERFUSION STUDY.  [name] OPEN  REDUCTION INTERNAL FIXATION (ORIF) DISTAL PHALANX Left 11/16/2018   Procedure: MIDDLE FINGER OPEN REDUCTION VERSUS RECONSTRUCTION;  Surgeon: Roseanne Kaufman, MD;  Location: [facility];  Service: Orthopedics;  Laterality: Left;  . PACEMAKER INSERTION    . POLYPECTOMY  03/03/2017   Procedure: POLYPECTOMY;  Surgeon: Danie Binder, MD;  [facility];  Service: Endoscopy;;  colon  . PPM GENERATOR CHANGEOUT N/A 06/18/2020   Procedure: PPM GENERATOR CHANGEOUT;  Surgeon: Sanda Klein, MD;  Location: [facility];  Service: Cardiovascular;  Laterality: N/A;  . TONSILLECTOMY    . YAG LASER APPLICATION Bilateral 2/63/3354   Procedure: YAG LASER APPLICATION;  Surgeon: Williams Che, MD;  Location: [facility];  Service: Ophthalmology;  Laterality: Bilateral;    Current Outpatient Medications  Medication Sig Dispense Refill  . acetaminophen (TYLENOL) 325 MG tablet Take 325 mg by mouth as needed for headache.    [name] apixaban (ELIQUIS) 2.5 MG TABS tablet Take 2.5 mg by mouth 2 (two) times daily.    [name] CALCIUM CARBONATE ANTACID PO Take 1 tablet by mouth daily as needed for heartburn or indigestion.    . carvedilol (COREG) 6.25 MG tablet Take 6.25 mg by mouth 2 (two) times daily with a meal.    . furosemide (LASIX) 40 MG tablet Take 1.5 tablets (60 mg total) by mouth daily. 135 tablet 3  . Multiple Vitamins-Minerals (PRESERVISION AREDS 2) CAPS Take 1 capsule by mouth 2 (two) times daily.     . pantoprazole (PROTONIX) 40 MG tablet Take 1 tablet (40 mg total) by mouth daily before breakfast. 30 tablet 3  . Polyethylene Glycol 400 (BLINK TEARS) 0.25 % SOLN Place 1 drop into both eyes daily as needed (for dry eye relief).     No current facility-administered medications for this visit.    Allergies:   Ciprofloxacin, Flagyl [metronidazole], Papaya derivatives, Iodine, Penicillins, and Sulfa antibiotics   Social History:  The patient  reports that she quit smoking about 15 years ago. Her smoking use  included cigarettes. She has a 15.00 pack-year smoking history. She has never used smokeless tobacco. She reports current alcohol use. She reports that she does not use drugs.   Family History:  The patient's family history includes Asthma in her sister; Cancer in her brother and sister; Heart failure in her brother; Pulmonary embolism in her brother.  ROS:  Please see the history of present illness.    All other systems are reviewed and otherwise negative.   PHYSICAL EXAM:  VS:  There were no vitals taken for this visit. BMI: There is no height or weight on file to calculate BMI. Well nourished, well developed, in no acute distress HEENT: normocephalic, atraumatic Neck: no JVD, carotid bruits or masses Cardiac:  *** RRR; no significant murmurs, no rubs, or gallops Lungs:  *** CTA b/l, no wheezing, rhonchi or rales Abd: soft, nontender MS: no deformity or ***  atrophy Ext: *** no edema Skin: warm and dry, no rash Neuro:  No gross deficits appreciated Psych: euthymic mood, full affect  *** PPM/ICD site is stable, no tethering or discomfort   EKG:  Done today and reviewed by myself shows  ***  Device interrogation done today and reviewed by myself:  ***  11/21/2019: TTE IMPRESSIONS  1. Left ventricular ejection fraction, by estimation, is 55 to 60%. The  left ventricle has normal function. The left ventricle has no regional  wall motion abnormalities. Left ventricular diastolic function could not  be evaluated.  2. Right ventricular systolic function is normal. The right ventricular  size is normal. There is mildly elevated pulmonary artery systolic  pressure. The estimated right ventricular systolic pressure is 96.2 mmHg.  3. Left atrial size was severely dilated.  4. Right atrial size was severely dilated.  5. The mitral valve is degenerative. Mild mitral valve regurgitation. No  evidence of mitral stenosis.  6. The aortic valve is tricuspid. Aortic valve regurgitation  is not  visualized. Mild to moderate aortic valve sclerosis/calcification is  present, without any evidence of aortic stenosis.  7. The inferior vena cava is normal in size with greater than 50%  respiratory variability, suggesting right atrial pressure of 3 mmHg.   Comparison(s): No significant change from prior study. Prior EF 60-65%  (2016).    Recent Labs: 06/19/2020: ALT 13; Magnesium 2.2 07/05/2020: Hemoglobin 10.9; Platelets 209 07/20/2020: BNP 260.2; BUN 27; Creatinine, Ser 1.74; Potassium 4.3; Sodium 140  No results found for requested labs within last 8760 hours.   CrCl cannot be calculated (Patient's most recent lab result is older than the maximum 21 days allowed.).   Wt Readings from Last 3 Encounters:  08/03/20 141 lb (64 kg)  07/26/20 141 lb 12.8 oz (64.3 kg)  07/20/20 139 lb 3.2 oz (63.1 kg)     Other studies reviewed: Additional studies/records reviewed today include: summarized above  ASSESSMENT AND PLAN:  1. CRT-P     ***  2. Permanent Afib     CHA2DS2Vasc is 5, on Eliquis, *** appropriately dosed     ***  3. Chronic CHF (diastoic)     ***   Disposition: F/u with ***  Current medicines are reviewed at length with the patient today.  The patient did not have any concerns regarding medicines.  Venetia Night, PA-C 08/19/2020 5:57 PM     [place] [address] [phone] (office)  [phone] (fax)

## 2020-08-20 ENCOUNTER — Other Ambulatory Visit: Payer: Self-pay

## 2020-08-20 MED ORDER — CARVEDILOL 6.25 MG PO TABS: 6.2500 mg | ORAL_TABLET | Freq: Two times a day (BID) | ORAL | 2 refills | Status: DC

## 2020-08-20 NOTE — Telephone Encounter (Signed)
Rx has been sent to the pharmacy electronically. ° °

## 2020-08-20 NOTE — Telephone Encounter (Signed)
This is Dr. Croitoru's pt 

## 2020-08-21 ENCOUNTER — Encounter: Payer: Medicare HMO | Admitting: Physician Assistant

## 2020-08-22 ENCOUNTER — Telehealth: Payer: Self-pay | Admitting: Cardiovascular Disease

## 2020-08-22 DIAGNOSIS — Z48812 Encounter for surgical aftercare following surgery on the circulatory system: Secondary | ICD-10-CM | POA: Diagnosis not present

## 2020-08-22 DIAGNOSIS — H409 Unspecified glaucoma: Secondary | ICD-10-CM | POA: Diagnosis not present

## 2020-08-22 DIAGNOSIS — I130 Hypertensive heart and chronic kidney disease with heart failure and stage 1 through stage 4 chronic kidney disease, or unspecified chronic kidney disease: Secondary | ICD-10-CM | POA: Diagnosis not present

## 2020-08-22 DIAGNOSIS — N1832 Chronic kidney disease, stage 3b: Secondary | ICD-10-CM | POA: Diagnosis not present

## 2020-08-22 DIAGNOSIS — I5043 Acute on chronic combined systolic (congestive) and diastolic (congestive) heart failure: Secondary | ICD-10-CM | POA: Diagnosis not present

## 2020-08-22 DIAGNOSIS — K2100 Gastro-esophageal reflux disease with esophagitis, without bleeding: Secondary | ICD-10-CM | POA: Diagnosis not present

## 2020-08-22 DIAGNOSIS — M479 Spondylosis, unspecified: Secondary | ICD-10-CM | POA: Diagnosis not present

## 2020-08-22 DIAGNOSIS — I4821 Permanent atrial fibrillation: Secondary | ICD-10-CM | POA: Diagnosis not present

## 2020-08-22 DIAGNOSIS — D631 Anemia in chronic kidney disease: Secondary | ICD-10-CM | POA: Diagnosis not present

## 2020-08-22 DIAGNOSIS — R69 Illness, unspecified: Secondary | ICD-10-CM | POA: Diagnosis not present

## 2020-08-22 NOTE — Telephone Encounter (Signed)
*  STAT* If patient is at the pharmacy, call can be transferred to refill team.   1. Which medications need to be refilled? (please list name of each medication and dose if known) carvedilol (COREG) 6.25 MG tablet  2. Which pharmacy/location (including street and city if local pharmacy) is medication to be sent to? [place], [place]  3. Do they need a 30 day or 90 day supply? 30 day supply

## 2020-09-05 ENCOUNTER — Other Ambulatory Visit: Payer: Self-pay | Admitting: Cardiovascular Disease

## 2020-09-05 DIAGNOSIS — I5043 Acute on chronic combined systolic (congestive) and diastolic (congestive) heart failure: Secondary | ICD-10-CM

## 2020-09-05 DIAGNOSIS — Z950 Presence of cardiac pacemaker: Secondary | ICD-10-CM

## 2020-09-06 ENCOUNTER — Ambulatory Visit: Payer: Medicare HMO | Admitting: Internal Medicine

## 2020-09-07 ENCOUNTER — Telehealth: Payer: Self-pay | Admitting: Emergency Medicine

## 2020-09-07 NOTE — Telephone Encounter (Signed)
   Scheduled remote received for 09/06/20. OptiVol remains elevated. Patient states compliance with all medications including Lasix 40 mg. Patient states no noticeable issues with edema at this time. Routing to Dr. Sallyanne Kuster just for review.

## 2020-09-10 ENCOUNTER — Other Ambulatory Visit: Payer: Self-pay

## 2020-09-10 DIAGNOSIS — Z79899 Other long term (current) drug therapy: Secondary | ICD-10-CM

## 2020-09-11 ENCOUNTER — Telehealth: Payer: Self-pay | Admitting: Emergency Medicine

## 2020-09-11 NOTE — Telephone Encounter (Signed)
Even better.  Thanks 

## 2020-09-11 NOTE — Telephone Encounter (Signed)
Patient has next remote scheduled on 09/18/20.

## 2020-09-11 NOTE — Telephone Encounter (Signed)
Can we please do another download in about 2 weeks?

## 2020-09-18 ENCOUNTER — Ambulatory Visit (INDEPENDENT_AMBULATORY_CARE_PROVIDER_SITE_OTHER): Payer: Medicare Other

## 2020-09-18 DIAGNOSIS — I442 Atrioventricular block, complete: Secondary | ICD-10-CM | POA: Diagnosis not present

## 2020-09-18 LAB — CUP PACEART REMOTE DEVICE CHECK
Battery Remaining Longevity: 96 mo
Battery Voltage: 3.15 V
Brady Statistic AP VP Percent: 0 %
Brady Statistic AP VS Percent: 0 %
Brady Statistic AS VP Percent: 99.48 %
Brady Statistic AS VS Percent: 0.52 %
Brady Statistic RA Percent Paced: 0 %
Brady Statistic RV Percent Paced: 99.48 %
Date Time Interrogation Session: 20220516205643
Implantable Lead Implant Date: 19971124
Implantable Lead Implant Date: 20070831
Implantable Lead Location: 753858
Implantable Lead Location: 753860
Implantable Lead Model: 4024
Implantable Lead Model: 4194
Implantable Pulse Generator Implant Date: 20220214
Lead Channel Impedance Value: 3363 Ohm
Lead Channel Impedance Value: 3363 Ohm
Lead Channel Impedance Value: 437 Ohm
Lead Channel Impedance Value: 475 Ohm
Lead Channel Impedance Value: 513 Ohm
Lead Channel Impedance Value: 513 Ohm
Lead Channel Impedance Value: 627 Ohm
Lead Channel Impedance Value: 684 Ohm
Lead Channel Impedance Value: 893 Ohm
Lead Channel Pacing Threshold Amplitude: 0.625 V
Lead Channel Pacing Threshold Amplitude: 1.625 V
Lead Channel Pacing Threshold Pulse Width: 0.4 ms
Lead Channel Pacing Threshold Pulse Width: 1 ms
Lead Channel Sensing Intrinsic Amplitude: 0.25 mV
Lead Channel Sensing Intrinsic Amplitude: 0.25 mV
Lead Channel Setting Pacing Amplitude: 2.5 V
Lead Channel Setting Pacing Amplitude: 2.75 V
Lead Channel Setting Pacing Pulse Width: 0.4 ms
Lead Channel Setting Pacing Pulse Width: 1 ms
Lead Channel Setting Sensing Sensitivity: 0.9 mV

## 2020-09-18 NOTE — Telephone Encounter (Signed)
Next remote scheduled for 10/18/20.

## 2020-09-18 NOTE — Telephone Encounter (Signed)
There is a hint of improving trend. If she is still asymptomatic, just recheck in a month please.

## 2020-09-18 NOTE — Telephone Encounter (Signed)
Scheduled remote received on 09/17/20 as follow up. OptiVol remains elevated. Otherwise normal device function.

## 2020-09-24 ENCOUNTER — Encounter: Payer: Medicare HMO | Admitting: Cardiovascular Disease

## 2020-10-11 NOTE — Progress Notes (Signed)
Remote pacemaker transmission.   

## 2020-10-18 ENCOUNTER — Ambulatory Visit (INDEPENDENT_AMBULATORY_CARE_PROVIDER_SITE_OTHER): Payer: Medicare Other

## 2020-10-18 DIAGNOSIS — I428 Other cardiomyopathies: Secondary | ICD-10-CM

## 2020-10-18 LAB — CUP PACEART REMOTE DEVICE CHECK
Battery Remaining Longevity: 113 mo
Battery Voltage: 3.13 V
Brady Statistic AP VP Percent: 0 %
Brady Statistic AP VS Percent: 0 %
Brady Statistic AS VP Percent: 99.45 %
Brady Statistic AS VS Percent: 0.55 %
Brady Statistic RA Percent Paced: 0 %
Brady Statistic RV Percent Paced: 99.44 %
Date Time Interrogation Session: 20220615205141
Implantable Lead Implant Date: 19971124
Implantable Lead Implant Date: 20070831
Implantable Lead Location: 753858
Implantable Lead Location: 753860
Implantable Lead Model: 4024
Implantable Lead Model: 4194
Implantable Pulse Generator Implant Date: 20220214
Lead Channel Impedance Value: 3363 Ohm
Lead Channel Impedance Value: 3363 Ohm
Lead Channel Impedance Value: 418 Ohm
Lead Channel Impedance Value: 475 Ohm
Lead Channel Impedance Value: 494 Ohm
Lead Channel Impedance Value: 513 Ohm
Lead Channel Impedance Value: 589 Ohm
Lead Channel Impedance Value: 627 Ohm
Lead Channel Impedance Value: 817 Ohm
Lead Channel Pacing Threshold Amplitude: 0.5 V
Lead Channel Pacing Threshold Amplitude: 1.375 V
Lead Channel Pacing Threshold Pulse Width: 0.4 ms
Lead Channel Pacing Threshold Pulse Width: 1 ms
Lead Channel Sensing Intrinsic Amplitude: 0.25 mV
Lead Channel Sensing Intrinsic Amplitude: 0.25 mV
Lead Channel Setting Pacing Amplitude: 2 V
Lead Channel Setting Pacing Amplitude: 2.5 V
Lead Channel Setting Pacing Pulse Width: 0.4 ms
Lead Channel Setting Pacing Pulse Width: 1 ms
Lead Channel Setting Sensing Sensitivity: 0.9 mV

## 2020-10-22 ENCOUNTER — Other Ambulatory Visit: Payer: Self-pay | Admitting: Cardiovascular Disease

## 2020-10-24 ENCOUNTER — Telehealth: Payer: Self-pay | Admitting: Cardiovascular Disease

## 2020-10-24 MED ORDER — FUROSEMIDE 40 MG PO TABS: 60.0000 mg | ORAL_TABLET | Freq: Every day | ORAL | 2 refills | Status: DC

## 2020-10-24 NOTE — Telephone Encounter (Signed)
Let patient know that I sent in refills for her Lasix over to the pharmacy.

## 2020-10-24 NOTE — Telephone Encounter (Signed)
*  STAT* If patient is at the pharmacy, call can be transferred to refill team.   1. Which medications need to be refilled? (please list name of each medication and dose if known)  furosemide (LASIX) 40 MG tablet  2. Which pharmacy/location (including street and city if local pharmacy) is medication to be sent to? [facility]  ([place], [place] , [address]  3. Do they need a 30 day or 90 day supply? 90 day supply

## 2020-10-30 NOTE — Telephone Encounter (Signed)
Should be on KCl as long as she is on furosemide

## 2020-10-31 DIAGNOSIS — R079 Chest pain, unspecified: Secondary | ICD-10-CM | POA: Diagnosis not present

## 2020-10-31 DIAGNOSIS — I5043 Acute on chronic combined systolic (congestive) and diastolic (congestive) heart failure: Secondary | ICD-10-CM | POA: Diagnosis not present

## 2020-10-31 DIAGNOSIS — R300 Dysuria: Secondary | ICD-10-CM | POA: Diagnosis not present

## 2020-10-31 DIAGNOSIS — R109 Unspecified abdominal pain: Secondary | ICD-10-CM | POA: Diagnosis not present

## 2020-10-31 DIAGNOSIS — I442 Atrioventricular block, complete: Secondary | ICD-10-CM | POA: Diagnosis not present

## 2020-10-31 DIAGNOSIS — N189 Chronic kidney disease, unspecified: Secondary | ICD-10-CM | POA: Diagnosis not present

## 2020-10-31 DIAGNOSIS — N183 Chronic kidney disease, stage 3 unspecified: Secondary | ICD-10-CM | POA: Diagnosis not present

## 2020-10-31 DIAGNOSIS — D649 Anemia, unspecified: Secondary | ICD-10-CM | POA: Diagnosis not present

## 2020-10-31 DIAGNOSIS — N179 Acute kidney failure, unspecified: Secondary | ICD-10-CM | POA: Diagnosis not present

## 2020-10-31 MED ORDER — POTASSIUM CHLORIDE ER 20 MEQ PO TBCR: 20.0000 meq | EXTENDED_RELEASE_TABLET | Freq: Every day | ORAL | 3 refills | Status: DC

## 2020-10-31 NOTE — Addendum Note (Signed)
Addended by: Ricci Barker on: 10/31/2020 09:43 AM   Modules accepted: Orders

## 2020-11-06 ENCOUNTER — Other Ambulatory Visit: Payer: Self-pay | Admitting: Cardiovascular Disease

## 2020-11-07 NOTE — Telephone Encounter (Signed)
Prescription refill request for Eliquis received. Indication: Atrial flutter Last office visit: 08/03/20  Arnold Long  Scr: 1.62 on 07/31/20 Age: 87 Weight: 64  Based on above findings Eliquis 2.5mg  twice daily is the appropriate dose.  Refill approved.

## 2020-11-08 NOTE — Progress Notes (Signed)
Remote pacemaker transmission.   

## 2020-11-12 ENCOUNTER — Encounter: Payer: Medicare Other | Admitting: Cardiovascular Disease

## 2020-11-15 DIAGNOSIS — R109 Unspecified abdominal pain: Secondary | ICD-10-CM | POA: Diagnosis not present

## 2020-11-15 DIAGNOSIS — K7689 Other specified diseases of liver: Secondary | ICD-10-CM | POA: Diagnosis not present

## 2020-12-03 ENCOUNTER — Encounter: Payer: Medicare Other | Admitting: Cardiovascular Disease

## 2020-12-03 DIAGNOSIS — Z6824 Body mass index (BMI) 24.0-24.9, adult: Secondary | ICD-10-CM | POA: Diagnosis not present

## 2020-12-03 DIAGNOSIS — R8290 Unspecified abnormal findings in urine: Secondary | ICD-10-CM | POA: Diagnosis not present

## 2020-12-03 DIAGNOSIS — R109 Unspecified abdominal pain: Secondary | ICD-10-CM | POA: Diagnosis not present

## 2020-12-06 ENCOUNTER — Telehealth: Payer: Self-pay | Admitting: Cardiovascular Disease

## 2020-12-06 NOTE — Telephone Encounter (Signed)
*  STAT* If patient is at the pharmacy, call can be transferred to refill team.   1. Which medications need to be refilled? (please list name of each medication and dose if known)  apixaban (ELIQUIS) 2.5 MG TABS tablet  2. Which pharmacy/location (including street and city if local pharmacy) is medication to be sent to? [facility]  ([place], [place]  3. Do they need a 30 day or 90 day supply? [place]

## 2020-12-07 MED ORDER — APIXABAN 2.5 MG PO TABS: 2.5000 mg | ORAL_TABLET | Freq: Two times a day (BID) | ORAL | 1 refills | Status: DC

## 2020-12-07 NOTE — Telephone Encounter (Signed)
Prescription refill request for Eliquis received. Indication:atrial fib Last office visit:4/22 Scr:1.6 Age: 87 Weight:64 kg  Prescription refilled

## 2020-12-11 DIAGNOSIS — R109 Unspecified abdominal pain: Secondary | ICD-10-CM | POA: Diagnosis not present

## 2020-12-18 ENCOUNTER — Ambulatory Visit (INDEPENDENT_AMBULATORY_CARE_PROVIDER_SITE_OTHER): Payer: Medicare Other

## 2020-12-18 DIAGNOSIS — I428 Other cardiomyopathies: Secondary | ICD-10-CM

## 2020-12-18 LAB — CUP PACEART REMOTE DEVICE CHECK
Battery Remaining Longevity: 97 mo
Battery Voltage: 3.06 V
Brady Statistic AP VP Percent: 0 %
Brady Statistic AP VS Percent: 0 %
Brady Statistic AS VP Percent: 99.48 %
Brady Statistic AS VS Percent: 0.52 %
Brady Statistic RA Percent Paced: 0 %
Brady Statistic RV Percent Paced: 99.48 %
Date Time Interrogation Session: 20220816014204
Implantable Lead Implant Date: 19971124
Implantable Lead Implant Date: 20070831
Implantable Lead Location: 753858
Implantable Lead Location: 753860
Implantable Lead Model: 4024
Implantable Lead Model: 4194
Implantable Pulse Generator Implant Date: 20220214
Lead Channel Impedance Value: 3363 Ohm
Lead Channel Impedance Value: 3363 Ohm
Lead Channel Impedance Value: 418 Ohm
Lead Channel Impedance Value: 437 Ohm
Lead Channel Impedance Value: 475 Ohm
Lead Channel Impedance Value: 475 Ohm
Lead Channel Impedance Value: 570 Ohm
Lead Channel Impedance Value: 627 Ohm
Lead Channel Impedance Value: 817 Ohm
Lead Channel Pacing Threshold Amplitude: 0.5 V
Lead Channel Pacing Threshold Amplitude: 1.875 V
Lead Channel Pacing Threshold Pulse Width: 0.4 ms
Lead Channel Pacing Threshold Pulse Width: 1 ms
Lead Channel Sensing Intrinsic Amplitude: 0.25 mV
Lead Channel Sensing Intrinsic Amplitude: 0.25 mV
Lead Channel Setting Pacing Amplitude: 2.5 V
Lead Channel Setting Pacing Amplitude: 2.5 V
Lead Channel Setting Pacing Pulse Width: 0.4 ms
Lead Channel Setting Pacing Pulse Width: 1 ms
Lead Channel Setting Sensing Sensitivity: 0.9 mV

## 2020-12-19 ENCOUNTER — Telehealth: Payer: Self-pay | Admitting: Cardiovascular Disease

## 2020-12-19 NOTE — Telephone Encounter (Signed)
Returned call to patient who states she is getting dizzy and lightheaded after she takes her carvedilol in the morning and the evening. She denies any recent change in medical history or other symptoms that may be contributing. Reports she has been taking the carvedilol for a long time but these symptoms started recently. She does not have a home BP cuff and reports she does not have the means to obtain one. I advised that I will reach out to our social workers to see if we can get a cuff for her. She is currently taking 1/2 the prescribed dose of furosemide at 30 mg instead of 60 mg due to frequent urination. I advised that I will forward message to Dr. Sallyanne Kuster for advice and that she should take 1/2 her Carvedilol dose (3.125 mg) until she hears back from [place]. I advised that someone from our office will follow-up regarding BP cuff also. She verbalized understanding and agreement with plan of care and thanked me for the call.

## 2020-12-19 NOTE — Telephone Encounter (Signed)
[name] received a referral for BP cuff, at this time each clinic is managing this through clinic team leads/[employer] business account. I referred [name], RN, to pass this request on to [employer]. If obtaining cuff becomes a logistical challenge then [name] team remains available.   Westley Hummer, MSW, [place]  [phone]

## 2020-12-19 NOTE — Telephone Encounter (Signed)
Pt c/o medication issue:  1. Name of Medication:  carvedilol (COREG) 6.25 MG tablet  2. How are you currently taking this medication (dosage and times per day)?  As directed  3. Are you having a reaction (difficulty breathing--STAT)?  See below  4. What is your medication issue?  Patient states this medication has been causing dizziness. Patient is not currently dizzy/does not keep a log of her BP/HR.

## 2020-12-20 NOTE — Telephone Encounter (Signed)
Called the patient to let her know that we had a blood pressure cuff available for her. She stated that she has no way of getting one. She was advised that we will drop it off at her house this weekend and teach her how to use it.

## 2020-12-22 NOTE — Telephone Encounter (Signed)
The blood pressure cuff was dropped off at the patient's house. We went over how to use it, when to check her blood pressure, and to keep a log of her pressures. She will bring this log with her to her next appointment with Dr. Sallyanne Kuster in September.  We went over all of her medications. She stated that she has been taking them as prescribed and was not cutting the doses on them. We went over each one and it's purpose. Dosages are correct as listed in epic.   We discussed her last phone call and the reason for it. She stated that she never decreased her carvedilol because it confuses her when she has to take her medicine differently than listed on her bottles. There seemed to be some confusion pertaining to the phone call and why she called. She stated that she is feeling fine and has been.  She has been advised to call us back if she has any questions or concerns with her medications, checking her blood pressure, or needs any form of assistance.

## 2020-12-26 ENCOUNTER — Telehealth: Payer: Self-pay | Admitting: Cardiovascular Disease

## 2020-12-26 NOTE — Telephone Encounter (Signed)
Spoke with pt who reports she is currently taking Furosemide 40mg  daily even though this dose was increased on 08/03/2020 by Lendon Colonel, NP to 60mg  daily.  Pt denies current edema or SOB.  Last note from 07/2020 OV with Dr Sallyanne Kuster states: TAKE Potassium 20 meq on Saturday and Sunday and then only as directed when taking a higher dosage of Furosemide.    Lasix was increased to 60mg  daily at 08/03/2020 visit by Lendon Colonel, NP.  Kimberly [name] is ordered as Potassium Chloride 102meq - 1 tablet by mouth daily on 10/31/2020.  Will forward to Dr Sallyanne Kuster for further review and advisement.

## 2020-12-26 NOTE — Telephone Encounter (Signed)
   Pt c/o medication issue:  1. Name of Medication: potassium chloride 20 MEQ TBCR  2. How are you currently taking this medication (dosage and times per day)? Take 20 mEq by mouth daily.  3. Are you having a reaction (difficulty breathing--STAT)?   4. What is your medication issue? Pt would like to know if she needs to continue taking this meds

## 2020-12-28 ENCOUNTER — Telehealth: Payer: Self-pay | Admitting: Cardiovascular Disease

## 2020-12-28 NOTE — Telephone Encounter (Signed)
Patient called to see if she can get a referral to an ophthalmologist.  She ask that it will be urgent because her left eye is getting bad. Please advise

## 2020-12-28 NOTE — Telephone Encounter (Signed)
Returned call to patient who states that she would like to see if Dr. Loletha Grayer could give her a referral to ophthalmology for her worsening Left eye. Advised patient she could also reach out to her PCP regarding this as well. Advised I would forward to Dr. Loletha Grayer for advice as well. Patient states she would also like to know how she should be taking her potassium. Patient reports she is taking Lasix 60mg  daily which was an increase and is still taking Potassium 56meq daily and wanted to know if she should continue that or make a change. Advised patient I would forward message to Dr. Loletha Grayer for him to review and advise. Patient verbalized understanding.

## 2020-12-31 NOTE — Telephone Encounter (Signed)
[name], Mihai, MD  You 2 days ago   Please take KCl 20 mEq once daily, every day    Returned call to patient, made patient aware and patient verbalized understanding. Advised patient to call back to office with any issues, questions, or concerns. Patient verbalized understanding.

## 2021-01-01 ENCOUNTER — Telehealth: Payer: Self-pay | Admitting: Cardiovascular Disease

## 2021-01-01 DIAGNOSIS — H269 Unspecified cataract: Secondary | ICD-10-CM

## 2021-01-01 NOTE — Telephone Encounter (Signed)
Patient requesting a referral to an ophthalmologist in [place]. She says her transportation cannot take her out of state.

## 2021-01-01 NOTE — Telephone Encounter (Signed)
Returned call to patient regarding request for referral She reports she had cataract surgery - left eye is "not that great" and having trouble with right eye She used to see Dr. Fransisco Beau who may have retired  She reports she contacted her PCP and they have no ophthalmologists in [place] and could not refer to someone in [place] and she cannot go to [place] with her transportation   Advised will send to Dr. Loletha Grayer to advise on referral

## 2021-01-01 NOTE — Telephone Encounter (Signed)
Referral ordered for [facility]   Notified patient of referral and provided her with phone # to call if she does not hear about referral next week

## 2021-01-08 NOTE — Progress Notes (Signed)
Remote pacemaker transmission.   

## 2021-01-10 ENCOUNTER — Telehealth: Payer: Self-pay | Admitting: Cardiovascular Disease

## 2021-01-10 NOTE — Telephone Encounter (Signed)
Patient states she has been having trouble sleeping. Every night she goes to sleep around 12:00 AM and she wakes up by 3:00 AM and can't go back to sleep. She would like to know what Dr. Sallyanne Kuster recommends.  Please advise.

## 2021-01-10 NOTE — Telephone Encounter (Signed)
Returned the call to the patient. She stated that the medication that she has to help her sleep does not help. She has been advised to call that provider to se if there is something else they can give her.  She verbalized her understanding.

## 2021-01-14 ENCOUNTER — Telehealth: Payer: Self-pay | Admitting: Nurse Practitioner

## 2021-01-14 NOTE — Telephone Encounter (Signed)
   Pt called this evening to report that she was outside and now has multiple bug bites w/ significant itching.  She has no other complaints.  I recommended she use OTC agents such as benadryl cream to the affected areas in addition to an oral antihistamine such as loratadine during waking hours and benadryl @ bedtime.  Caller verbalized understanding and was grateful for the call back.  She actually has f/u with her PCP in the morning and will address with him as well.  Murray Hodgkins, NP 01/14/2021, 6:19 PM

## 2021-01-17 ENCOUNTER — Telehealth: Payer: Self-pay | Admitting: Cardiovascular Disease

## 2021-01-17 DIAGNOSIS — K429 Umbilical hernia without obstruction or gangrene: Secondary | ICD-10-CM | POA: Diagnosis not present

## 2021-01-17 DIAGNOSIS — Z91041 Radiographic dye allergy status: Secondary | ICD-10-CM | POA: Diagnosis not present

## 2021-01-17 DIAGNOSIS — Z79899 Other long term (current) drug therapy: Secondary | ICD-10-CM | POA: Diagnosis not present

## 2021-01-17 DIAGNOSIS — I700 Atherosclerosis of aorta: Secondary | ICD-10-CM | POA: Diagnosis not present

## 2021-01-17 DIAGNOSIS — K449 Diaphragmatic hernia without obstruction or gangrene: Secondary | ICD-10-CM | POA: Diagnosis not present

## 2021-01-17 DIAGNOSIS — K5730 Diverticulosis of large intestine without perforation or abscess without bleeding: Secondary | ICD-10-CM | POA: Diagnosis not present

## 2021-01-17 DIAGNOSIS — R5381 Other malaise: Secondary | ICD-10-CM | POA: Diagnosis not present

## 2021-01-17 DIAGNOSIS — R748 Abnormal levels of other serum enzymes: Secondary | ICD-10-CM | POA: Diagnosis not present

## 2021-01-17 DIAGNOSIS — R109 Unspecified abdominal pain: Secondary | ICD-10-CM | POA: Diagnosis not present

## 2021-01-17 DIAGNOSIS — K5790 Diverticulosis of intestine, part unspecified, without perforation or abscess without bleeding: Secondary | ICD-10-CM | POA: Diagnosis not present

## 2021-01-17 DIAGNOSIS — Z880 Allergy status to penicillin: Secondary | ICD-10-CM | POA: Diagnosis not present

## 2021-01-17 DIAGNOSIS — R1013 Epigastric pain: Secondary | ICD-10-CM | POA: Diagnosis not present

## 2021-01-17 DIAGNOSIS — N200 Calculus of kidney: Secondary | ICD-10-CM | POA: Diagnosis not present

## 2021-01-17 DIAGNOSIS — Z743 Need for continuous supervision: Secondary | ICD-10-CM | POA: Diagnosis not present

## 2021-01-17 DIAGNOSIS — Z881 Allergy status to other antibiotic agents status: Secondary | ICD-10-CM | POA: Diagnosis not present

## 2021-01-17 DIAGNOSIS — Z882 Allergy status to sulfonamides status: Secondary | ICD-10-CM | POA: Diagnosis not present

## 2021-01-17 DIAGNOSIS — R279 Unspecified lack of coordination: Secondary | ICD-10-CM | POA: Diagnosis not present

## 2021-01-17 NOTE — Telephone Encounter (Signed)
New message:     Patient calling stating that she has been having so much trouble going and falling to sleep. Patient is upset stating she need some help. Please call patient.

## 2021-01-17 NOTE — Telephone Encounter (Signed)
Returned call to pt she states that she is unable to sleep d/t right to left abdominal pain. She states that she has done many inconclusive tests and nothing. Informed pt she needs to continue to discuss w/PCP and GI. She states that she has appt with PCP today. She will discuss w/PCP.

## 2021-01-21 DIAGNOSIS — Z743 Need for continuous supervision: Secondary | ICD-10-CM | POA: Diagnosis not present

## 2021-01-21 DIAGNOSIS — R6889 Other general symptoms and signs: Secondary | ICD-10-CM | POA: Diagnosis not present

## 2021-01-21 DIAGNOSIS — Z880 Allergy status to penicillin: Secondary | ICD-10-CM | POA: Diagnosis not present

## 2021-01-21 DIAGNOSIS — M4180 Other forms of scoliosis, site unspecified: Secondary | ICD-10-CM | POA: Diagnosis not present

## 2021-01-21 DIAGNOSIS — R279 Unspecified lack of coordination: Secondary | ICD-10-CM | POA: Diagnosis not present

## 2021-01-21 DIAGNOSIS — Z91041 Radiographic dye allergy status: Secondary | ICD-10-CM | POA: Diagnosis not present

## 2021-01-21 DIAGNOSIS — Z9049 Acquired absence of other specified parts of digestive tract: Secondary | ICD-10-CM | POA: Diagnosis not present

## 2021-01-21 DIAGNOSIS — I10 Essential (primary) hypertension: Secondary | ICD-10-CM | POA: Diagnosis not present

## 2021-01-21 DIAGNOSIS — M549 Dorsalgia, unspecified: Secondary | ICD-10-CM | POA: Diagnosis not present

## 2021-01-21 DIAGNOSIS — Z950 Presence of cardiac pacemaker: Secondary | ICD-10-CM | POA: Diagnosis not present

## 2021-01-21 DIAGNOSIS — Z882 Allergy status to sulfonamides status: Secondary | ICD-10-CM | POA: Diagnosis not present

## 2021-01-21 DIAGNOSIS — R109 Unspecified abdominal pain: Secondary | ICD-10-CM | POA: Diagnosis not present

## 2021-01-21 DIAGNOSIS — N200 Calculus of kidney: Secondary | ICD-10-CM | POA: Diagnosis not present

## 2021-01-21 DIAGNOSIS — I7090 Unspecified atherosclerosis: Secondary | ICD-10-CM | POA: Diagnosis not present

## 2021-01-22 DIAGNOSIS — W19XXXA Unspecified fall, initial encounter: Secondary | ICD-10-CM | POA: Diagnosis not present

## 2021-01-22 DIAGNOSIS — R531 Weakness: Secondary | ICD-10-CM | POA: Diagnosis not present

## 2021-01-22 DIAGNOSIS — R5381 Other malaise: Secondary | ICD-10-CM | POA: Diagnosis not present

## 2021-01-24 ENCOUNTER — Telehealth: Payer: Self-pay | Admitting: Cardiovascular Disease

## 2021-01-24 DIAGNOSIS — Z9049 Acquired absence of other specified parts of digestive tract: Secondary | ICD-10-CM | POA: Diagnosis not present

## 2021-01-24 DIAGNOSIS — Z950 Presence of cardiac pacemaker: Secondary | ICD-10-CM | POA: Diagnosis not present

## 2021-01-24 DIAGNOSIS — R1013 Epigastric pain: Secondary | ICD-10-CM | POA: Diagnosis not present

## 2021-01-24 DIAGNOSIS — M549 Dorsalgia, unspecified: Secondary | ICD-10-CM | POA: Diagnosis not present

## 2021-01-24 DIAGNOSIS — R6889 Other general symptoms and signs: Secondary | ICD-10-CM | POA: Diagnosis not present

## 2021-01-24 DIAGNOSIS — I119 Hypertensive heart disease without heart failure: Secondary | ICD-10-CM | POA: Diagnosis not present

## 2021-01-24 DIAGNOSIS — R109 Unspecified abdominal pain: Secondary | ICD-10-CM | POA: Diagnosis not present

## 2021-01-24 DIAGNOSIS — N189 Chronic kidney disease, unspecified: Secondary | ICD-10-CM | POA: Diagnosis not present

## 2021-01-24 DIAGNOSIS — Z79899 Other long term (current) drug therapy: Secondary | ICD-10-CM | POA: Diagnosis not present

## 2021-01-24 DIAGNOSIS — Z87891 Personal history of nicotine dependence: Secondary | ICD-10-CM | POA: Diagnosis not present

## 2021-01-24 DIAGNOSIS — M791 Myalgia, unspecified site: Secondary | ICD-10-CM | POA: Diagnosis not present

## 2021-01-24 DIAGNOSIS — I4891 Unspecified atrial fibrillation: Secondary | ICD-10-CM | POA: Diagnosis not present

## 2021-01-24 DIAGNOSIS — I130 Hypertensive heart and chronic kidney disease with heart failure and stage 1 through stage 4 chronic kidney disease, or unspecified chronic kidney disease: Secondary | ICD-10-CM | POA: Diagnosis not present

## 2021-01-24 DIAGNOSIS — I509 Heart failure, unspecified: Secondary | ICD-10-CM | POA: Diagnosis not present

## 2021-01-24 DIAGNOSIS — J984 Other disorders of lung: Secondary | ICD-10-CM | POA: Diagnosis not present

## 2021-01-24 DIAGNOSIS — R279 Unspecified lack of coordination: Secondary | ICD-10-CM | POA: Diagnosis not present

## 2021-01-24 DIAGNOSIS — R197 Diarrhea, unspecified: Secondary | ICD-10-CM | POA: Diagnosis not present

## 2021-01-24 DIAGNOSIS — K449 Diaphragmatic hernia without obstruction or gangrene: Secondary | ICD-10-CM | POA: Diagnosis not present

## 2021-01-24 DIAGNOSIS — M419 Scoliosis, unspecified: Secondary | ICD-10-CM | POA: Diagnosis not present

## 2021-01-24 DIAGNOSIS — K529 Noninfective gastroenteritis and colitis, unspecified: Secondary | ICD-10-CM | POA: Diagnosis not present

## 2021-01-24 DIAGNOSIS — Z743 Need for continuous supervision: Secondary | ICD-10-CM | POA: Diagnosis not present

## 2021-01-24 DIAGNOSIS — K5792 Diverticulitis of intestine, part unspecified, without perforation or abscess without bleeding: Secondary | ICD-10-CM | POA: Diagnosis not present

## 2021-01-24 NOTE — Telephone Encounter (Signed)
Patient calling back to request the office call [facility] and let them know it is an emergency. She states the pharmacy stops delivering at noon, but she is itching very badly and needs the cortisone cream. She says she has not slept in over 24 hr, due to the itching and says it is so bad she is concerned how bad she may scratch herself during the night. She states she does not need a prescription, she needs someone to tell the pharmacy to do an emergency delivery of cortisone cream for her.

## 2021-01-24 NOTE — Telephone Encounter (Signed)
Spoke to patient she stated she is itching all over.No rash.Stated she has invisible bugs.Advised ok to use cortisone cream.Advised to call PCP if she continues to itch.

## 2021-01-24 NOTE — Telephone Encounter (Signed)
Pt is wanting to know what she can put on her skin for itching. Pt states it's some "little white things floating around that she cant see"  and it's making her itch. Please advise pt further.

## 2021-01-24 NOTE — Telephone Encounter (Signed)
Attempted to call patient back twice.Phone rings busy.Spoke to [facility] deliveries stop at 12:00 noon.They will deliver over the counter cortisone cream in the am.

## 2021-01-28 ENCOUNTER — Encounter: Payer: Medicare Other | Admitting: Cardiovascular Disease

## 2021-01-30 ENCOUNTER — Other Ambulatory Visit: Payer: Self-pay | Admitting: Cardiovascular Disease

## 2021-01-30 MED ORDER — POTASSIUM CHLORIDE ER 20 MEQ PO TBCR: 20.0000 meq | EXTENDED_RELEASE_TABLET | Freq: Every day | ORAL | 3 refills | Status: DC

## 2021-01-30 MED ORDER — APIXABAN 2.5 MG PO TABS: 2.5000 mg | ORAL_TABLET | Freq: Two times a day (BID) | ORAL | 1 refills | Status: DC

## 2021-01-30 NOTE — Telephone Encounter (Signed)
 *  STAT* If patient is at the pharmacy, call can be transferred to refill team.   1. Which medications need to be refilled? (please list name of each medication and dose if known) apixaban (ELIQUIS) 2.5 MG TABS tablet carvedilol (COREG) 6.25 MG tablet furosemide (LASIX) 40 MG tablet potassium chloride 20 MEQ TBCR  2. Which pharmacy/location (including street and city if local pharmacy) is medication to be sent to? [facility]  ([place], [place]  3. Do they need a 30 day or 90 day supply? 90 days  Pt said she felt like her meds has been tampered, she said her neighbors has access to her medications and she needs new refills of every meds.

## 2021-01-30 NOTE — Telephone Encounter (Signed)
Prescription refill request for Eliquis received. Indication:afib Last office visit:lawrence 08/03/20 Scr:1.69 01/24/21 Age: 60f Weight:64kg

## 2021-02-03 DIAGNOSIS — I10 Essential (primary) hypertension: Secondary | ICD-10-CM | POA: Diagnosis not present

## 2021-02-03 DIAGNOSIS — Z881 Allergy status to other antibiotic agents status: Secondary | ICD-10-CM | POA: Diagnosis not present

## 2021-02-03 DIAGNOSIS — Z79899 Other long term (current) drug therapy: Secondary | ICD-10-CM | POA: Diagnosis not present

## 2021-02-03 DIAGNOSIS — R531 Weakness: Secondary | ICD-10-CM | POA: Diagnosis not present

## 2021-02-03 DIAGNOSIS — L03116 Cellulitis of left lower limb: Secondary | ICD-10-CM | POA: Diagnosis not present

## 2021-02-03 DIAGNOSIS — I4510 Unspecified right bundle-branch block: Secondary | ICD-10-CM | POA: Diagnosis not present

## 2021-02-03 DIAGNOSIS — G4489 Other headache syndrome: Secondary | ICD-10-CM | POA: Diagnosis not present

## 2021-02-03 DIAGNOSIS — Z880 Allergy status to penicillin: Secondary | ICD-10-CM | POA: Diagnosis not present

## 2021-02-03 DIAGNOSIS — Z743 Need for continuous supervision: Secondary | ICD-10-CM | POA: Diagnosis not present

## 2021-02-03 DIAGNOSIS — Z91041 Radiographic dye allergy status: Secondary | ICD-10-CM | POA: Diagnosis not present

## 2021-02-03 DIAGNOSIS — Z20822 Contact with and (suspected) exposure to covid-19: Secondary | ICD-10-CM | POA: Diagnosis not present

## 2021-02-03 DIAGNOSIS — Z882 Allergy status to sulfonamides status: Secondary | ICD-10-CM | POA: Diagnosis not present

## 2021-02-03 DIAGNOSIS — R9431 Abnormal electrocardiogram [ECG] [EKG]: Secondary | ICD-10-CM | POA: Diagnosis not present

## 2021-02-03 DIAGNOSIS — Z888 Allergy status to other drugs, medicaments and biological substances status: Secondary | ICD-10-CM | POA: Diagnosis not present

## 2021-02-03 DIAGNOSIS — R6889 Other general symptoms and signs: Secondary | ICD-10-CM | POA: Diagnosis not present

## 2021-02-03 DIAGNOSIS — Z950 Presence of cardiac pacemaker: Secondary | ICD-10-CM | POA: Diagnosis not present

## 2021-02-03 DIAGNOSIS — R519 Headache, unspecified: Secondary | ICD-10-CM | POA: Diagnosis not present

## 2021-02-04 DIAGNOSIS — Z7901 Long term (current) use of anticoagulants: Secondary | ICD-10-CM | POA: Diagnosis not present

## 2021-02-04 DIAGNOSIS — I480 Paroxysmal atrial fibrillation: Secondary | ICD-10-CM | POA: Diagnosis not present

## 2021-02-04 DIAGNOSIS — R600 Localized edema: Secondary | ICD-10-CM | POA: Diagnosis not present

## 2021-02-04 DIAGNOSIS — Z950 Presence of cardiac pacemaker: Secondary | ICD-10-CM | POA: Diagnosis not present

## 2021-02-04 DIAGNOSIS — I10 Essential (primary) hypertension: Secondary | ICD-10-CM | POA: Diagnosis not present

## 2021-02-04 DIAGNOSIS — Z9049 Acquired absence of other specified parts of digestive tract: Secondary | ICD-10-CM | POA: Diagnosis not present

## 2021-02-04 DIAGNOSIS — G8929 Other chronic pain: Secondary | ICD-10-CM | POA: Diagnosis not present

## 2021-02-04 DIAGNOSIS — Z743 Need for continuous supervision: Secondary | ICD-10-CM | POA: Diagnosis not present

## 2021-02-04 DIAGNOSIS — I517 Cardiomegaly: Secondary | ICD-10-CM | POA: Diagnosis not present

## 2021-02-04 DIAGNOSIS — L03116 Cellulitis of left lower limb: Secondary | ICD-10-CM | POA: Diagnosis not present

## 2021-02-04 DIAGNOSIS — I700 Atherosclerosis of aorta: Secondary | ICD-10-CM | POA: Diagnosis not present

## 2021-02-04 DIAGNOSIS — R609 Edema, unspecified: Secondary | ICD-10-CM | POA: Diagnosis not present

## 2021-02-04 DIAGNOSIS — R109 Unspecified abdominal pain: Secondary | ICD-10-CM | POA: Diagnosis not present

## 2021-02-04 DIAGNOSIS — M419 Scoliosis, unspecified: Secondary | ICD-10-CM | POA: Diagnosis not present

## 2021-02-04 DIAGNOSIS — R0902 Hypoxemia: Secondary | ICD-10-CM | POA: Diagnosis not present

## 2021-02-04 DIAGNOSIS — R1031 Right lower quadrant pain: Secondary | ICD-10-CM | POA: Diagnosis not present

## 2021-02-12 DIAGNOSIS — L539 Erythematous condition, unspecified: Secondary | ICD-10-CM | POA: Diagnosis not present

## 2021-02-12 DIAGNOSIS — I10 Essential (primary) hypertension: Secondary | ICD-10-CM | POA: Diagnosis not present

## 2021-02-12 DIAGNOSIS — M7989 Other specified soft tissue disorders: Secondary | ICD-10-CM | POA: Diagnosis not present

## 2021-02-12 DIAGNOSIS — Z79899 Other long term (current) drug therapy: Secondary | ICD-10-CM | POA: Diagnosis not present

## 2021-02-12 DIAGNOSIS — R6889 Other general symptoms and signs: Secondary | ICD-10-CM | POA: Diagnosis not present

## 2021-02-12 DIAGNOSIS — Z743 Need for continuous supervision: Secondary | ICD-10-CM | POA: Diagnosis not present

## 2021-02-12 DIAGNOSIS — M79605 Pain in left leg: Secondary | ICD-10-CM | POA: Diagnosis not present

## 2021-02-12 DIAGNOSIS — L03116 Cellulitis of left lower limb: Secondary | ICD-10-CM | POA: Diagnosis not present

## 2021-02-12 NOTE — Progress Notes (Deleted)
Referring Provider: Curlene Labrum, MD Primary Care Physician:  Curlene Labrum, MD Primary GI Physician: Dr. Abbey Chatters  No chief complaint on file.   HPI:   Kimberly [name] is a 87 y.o. female presenting today for follow-up on alternating constipation and diarrhea, GERD, right sided abdominal pain, and dark stools.   She has history of chronic right lower quadrant pain for years felt to be musculoskeletal/abdominal wall pain.  Last colonoscopy in 2018 with 5 polyps removed, diverticulosis in the rectosigmoid and sigmoid colon, internal hemorrhoids.  Pathology with tubular adenomas, sessile serrated polyp, hyperplastic polyp.  Only able to see 99% of the colon.  Unable to clear cecum for examination.  Dr. Oneida Alar had recommended repeat colonoscopy in 1-2 months, but this was not completed.  Overall, suspected rectal bleeding was secondary to sigmoid colon polyp and diverticulosis in the setting of Eliquis. EGD in October 2018 with moderate gastritis, mild duodenitis. Pathology with chronic gastritis, negative H. Pylori.  Hospitalized with episode of diverticulitis and suspected diverticular bleed in June 2020. No endoscopic evaluation at that time.   Hospitalized in January 2022 at [facility] with anemia and suspected upper GI bleed with 1 melanotic stool noted on arrival.  This is in the setting of anticoagulation on Eliquis.  Hemoglobin was 10.1 on admission and declined to 8.9 the following day. Eliquis was held and she received 2 unit PRBCs with hemoglobin remaining in the 10-11 range thereafter.  No acute findings on CT A/P w/o contrast. She was discharged and advised to continue to hold Eliquis until following up with PCP.  Also recommended to follow-up with GI for consideration of endoscopy and possible colonoscopy.  Eliquis later resumed on 3/3 by cardiology.   Last seen in our office 07/26/20: Reported alternating constipation and diarrhea, but suspected patient was more on the  constipated side and experienced overflow diarrhea.  May also be passing the mark with use of laxatives and/or antidiarrheals intermittently.  No known dietary triggers.  Denies bright red blood per rectum, but reported having a black stool in January.  Noted her most recent hemoglobin on 3/20 was stable at 10.3.  Also noted uncontrolled GERD with symptoms daily, postprandial nausea without vomiting, not on Protonix.  Ongoing chronic right lower quadrant abdominal pain that seem to be worsening over the last few months.  Pain was constant and worsened with movement.  Denied association with meals.  Somewhat worse with bowel movements, possibly secondary to straining, no relief with a bowel movement.  Noted recent CTs on file with no acute findings.  Chronic back pain, worsened after recent fall.  Overall, suspect abdominal pain is multifactorial in the setting of MSK etiology, adhesions, and constipation.  Consider occult malignancy does remain on the differential as her last colonoscopy was in 2018 with incomplete visualization of the cecum.  Plan included UA, urine culture, iron panel, I FOBT, stop over-the-counter laxatives and antidiarrheals and start Benefiber and Colace daily, start Protonix 40 mg daily.  Requested progress report in 2 weeks.  Follow-up in 4 to 6 weeks.    Labs were not completed.  No progress report received.  Patient has been seen in the emergency room several times over the last month for abdominal pain. Laboratory evaluation with elevated Lipase  (262 on 9/15, 174 on 9/22). Notably this is a chronic findings in 2021 with no CT findings of pancreatitis. LFTs within normal limits. Creatinine elevated in the setting of CKD, no AKI. Hemoglobin fairly stable  in the 10-11 range.  CT A/P w/o contrast on 9/15 with no acute findings.  Abdominal x-ray on 10/3 with moderate stool.   Today:      Past Medical History:  Diagnosis Date   Acute blood loss anemia 10/11/2012   Acute  diverticulitis 08/24/2013   Acute on chronic combined systolic and diastolic CHF, NYHA class 4 (HCC) 11/15/2013   Antral ulcer 10/11/2012   Arrhythmia    atrial fibb   Atrial fibrillation (HCC)    Bipolar affective disorder ([place]) 03/23/2017   Cardiomyopathy, nonischemic (HCC)    Chronic anticoagulation 10/12/2012   CKD (chronic kidney disease) stage 3, GFR 30-59 ml/min ([place]) 10/12/2012   Depression    Erosive esophagitis 10/11/2012   Fibromyalgia    Glaucoma    H/O echocardiogram 2007   EF 40-45%,          Hypertension    Osteoarthritis    Pacemaker    Last saw cards 07/2013   Scoliosis     Past Surgical History:  Procedure Laterality Date   ABDOMINAL HYSTERECTOMY     APPENDECTOMY     BACK SURGERY     BIOPSY  03/03/2017   Procedure: BIOPSY;  Surgeon: Danie Binder, MD;  [facility]: [facility];  Service: Endoscopy;;  gastric   BREAST SURGERY     CARDIAC CATHETERIZATION  12/08/2005   LAD AND LEFT MAIN WITH NO HIGH-GRADE STENOSIS. MILD DISEASE IN THE CX AND LAD SYSTEM. SEVERE LV DYSFUNCTION WITH DILATION OF THE LV. EF 15-20%. LV END-DIASTOLIC PRESSURE IS 90. +1 MR.   CHOLECYSTECTOMY     COLONOSCOPY N/A 03/03/2017   Dr. Oneida Alar: 5 colon polyps removed, adenomatous.  Diverticulosis.  99% of the colon was cleared but the cecum was not adequately seen.   CYSTOSCOPY N/A 02/24/2013   Procedure: CYSTOSCOPY WITH URETHRAL DILITATION;  Surgeon: Marissa Nestle, MD;  Location: [facility];  Service: Urology;  Laterality: N/A;   DOPPLER ECHOCARDIOGRAPHY N/A 05/30/2010   LV SIZE IS NORMAL. LV SYSTOLIC FUNCTION IS LOW NORMAL. EF=50-55%. MILD INFERIOR HYPOKINESIS.MILD TO MODERATE POSTERIOR WALL HYPOKINESIS.PACEMAKER LEAD IN THE RV. LA IS MILDLY DILATED. RA IS MODERATE TO SEVERLY DILATED. PACEMAKER LEAD IN THE RA. MILD CALCICICATION OF THE MV APPARATUS. MODERATE MR. MILD TO MODERATE TR. MILD PHTN.AV MILDLY SCLEROTIC.   ESOPHAGOGASTRODUODENOSCOPY N/A 10/13/2012   Dr. Gala Romney: severe ulcerative reflux  esophagitis, question of Barrett's but negative path, single deep prepyloric antral ulcer, negative H.pylori   ESOPHAGOGASTRODUODENOSCOPY N/A 03/03/2017   Dr. Oneida Alar: Gastritis/duodenitis, no H. pylori   HERNIA REPAIR     right inguinal hernia and umbilical   LOWETR EXT VENOUS Bilateral 11-08-10   R & L- NO EVIDENCE OF THROMBUS OR THROMBOPHLEBITIS. THERE IS MILD AMOUNT OF SUBCUTANEOUS EDEMA NOTED WITHIN THE LEFT CALF AND ANKLE. R & L GSV AND SSV- NO VENOUS INSUFF NOTED.   NECK SURGERY     NUCLEAR STRESS TEST N/A 02/13/2009   NORMAL PATTERN OF PERFUSION IN ALL REGIONS. POST STRESS VENTICULAR SIZE IS NORMAL. POST  STESS EF 85%.  NORMAL MYOCARDIAL PERFUSION STUDY.   OPEN REDUCTION INTERNAL FIXATION (ORIF) DISTAL PHALANX Left 11/16/2018   Procedure: MIDDLE FINGER OPEN REDUCTION VERSUS RECONSTRUCTION;  Surgeon: Roseanne Kaufman, MD;  Location: [facility];  Service: Orthopedics;  Laterality: Left;   PACEMAKER INSERTION     POLYPECTOMY  03/03/2017   Procedure: POLYPECTOMY;  Surgeon: Danie Binder, MD;  [facility];  Service: Endoscopy;;  colon   PPM GENERATOR CHANGEOUT N/A 06/18/2020  Procedure: PPM GENERATOR CHANGEOUT;  Surgeon: Sanda Klein, MD;  Location: [place] [facility];  Service: Cardiovascular;  Laterality: N/A;   TONSILLECTOMY     YAG LASER APPLICATION Bilateral 2/95/1884   Procedure: YAG LASER APPLICATION;  Surgeon: Williams Che, MD;  Location: [facility];  Service: Ophthalmology;  Laterality: Bilateral;    Current Outpatient Medications  Medication Sig Dispense Refill   acetaminophen (TYLENOL) 325 MG tablet Take 325 mg by mouth as needed for headache.     apixaban (ELIQUIS) 2.5 MG TABS tablet Take 1 tablet (2.5 mg total) by mouth 2 (two) times daily. 180 tablet 1   CALCIUM CARBONATE ANTACID PO Take 1 tablet by mouth daily as needed for heartburn or indigestion.     carvedilol (COREG) 6.25 MG tablet TAKE 1 TABLET BY MOUTH  TWICE DAILY WITH A MEAL 180 tablet 3   furosemide  (LASIX) 40 MG tablet Take 1.5 tablets (60 mg total) by mouth daily. 135 tablet 2   Multiple Vitamins-Minerals (PRESERVISION AREDS 2) CAPS Take 1 capsule by mouth 2 (two) times daily.      pantoprazole (PROTONIX) 40 MG tablet Take 1 tablet (40 mg total) by mouth daily before breakfast. 30 tablet 3   Polyethylene Glycol 400 (BLINK TEARS) 0.25 % SOLN Place 1 drop into both eyes daily as needed (for dry eye relief).     Potassium Chloride ER 20 MEQ TBCR Take 20 mEq by mouth daily. 30 tablet 3   No current facility-administered medications for this visit.    Allergies as of 02/14/2021 - Review Complete 08/03/2020  Allergen Reaction Noted   Ciprofloxacin Other (See Comments) 03/21/2017   Flagyl [metronidazole] Other (See Comments) 03/21/2017   Papaya derivatives Hives 01/22/2011   Iodine Rash and Other (See Comments) 01/22/2011   Penicillins Hives 01/22/2011   Sulfa antibiotics Rash 01/22/2011    Family History  Problem Relation Age of Onset   Cancer Sister    Asthma Sister    Heart failure Brother    Pulmonary embolism Brother    Cancer Brother    Colon cancer Neg Hx     Social History   Socioeconomic History   Marital status: Widowed    Spouse name: Not on file   Number of children: Not on file   Years of education: Not on file   Highest education level: Some college, no degree  Occupational History   Occupation: Primary school teacher    Employer: RETIRED    Comment: retired  Tobacco Use   Smoking status: Former    Packs/day: 0.50    Years: 30.00    Pack years: 15.00    Types: Cigarettes    Quit date: 12/13/2004    Years since quitting: 16.1   Smokeless tobacco: Never   Tobacco comments:    former smoker  Vaping Use   Vaping Use: Never used  Substance and Sexual Activity   Alcohol use: Yes    Alcohol/week: 0.0 standard drinks    Comment: history of drinking a 1/2 glass of wine in the evening   Drug use: No   Sexual activity: Never  Other Topics Concern   Not on  file  Social History Narrative   No home exercise program. PT ordered this week.   Social Determinants of Health   Financial Resource Strain: Low Risk    Difficulty of Paying Living Expenses: Not very hard  Food Insecurity: No Food Insecurity   Worried About Running Out of Food in the Last Year: Never  true   Ran Out of Food in the Last Year: Never true  Transportation Needs: Unmet Transportation Needs   Lack of Transportation (Medical): Yes   Lack of Transportation (Non-Medical): No  Physical Activity: Not on file  Stress: Not on file  Social Connections: Not on file    Review of Systems: Gen: Denies fever, chills, anorexia. Denies fatigue, weakness, weight loss.  CV: Denies chest pain, palpitations, syncope, peripheral edema, and claudication. Resp: Denies dyspnea at rest, cough, wheezing, coughing up blood, and pleurisy. GI: Denies vomiting blood, jaundice, and fecal incontinence.   Denies dysphagia or odynophagia. Derm: Denies rash, itching, dry skin Psych: Denies depression, anxiety, memory loss, confusion. No homicidal or suicidal ideation.  Heme: Denies bruising, bleeding, and enlarged lymph nodes.  Physical Exam: There were no vitals taken for this visit. General:   Alert and oriented. No distress noted. Pleasant and cooperative.  Head:  Normocephalic and atraumatic. Eyes:  Conjuctiva clear without scleral icterus. Mouth:  Oral mucosa pink and moist. Good dentition. No lesions. Heart:  S1, S2 present without murmurs appreciated. Lungs:  Clear to auscultation bilaterally. No wheezes, rales, or rhonchi. No distress.  Abdomen:  +BS, soft, non-tender and non-distended. No rebound or guarding. No HSM or masses noted. Msk:  Symmetrical without gross deformities. Normal posture. Extremities:  Without edema. Neurologic:  Alert and  oriented x4 Psych:  Alert and cooperative. Normal mood and affect.

## 2021-02-14 ENCOUNTER — Telehealth: Payer: Self-pay | Admitting: Cardiovascular Disease

## 2021-02-14 ENCOUNTER — Encounter: Payer: Self-pay | Admitting: Internal Medicine

## 2021-02-14 ENCOUNTER — Ambulatory Visit: Payer: Medicare Other | Admitting: Gastroenterology

## 2021-02-14 NOTE — Telephone Encounter (Signed)
Pt c/o medication issue:  1. Name of Medication: not sure which medication is causing issue   2. How are you currently taking this medication (dosage and times per day)? N/a  3. Are you having a reaction (difficulty breathing--STAT)? Swelling in leg, insomnia and also thinks that meds are effecting her heart   4. What is your medication issue? having an issue with an infection in her left leg.. medication to treat is causing swelling in leg and insomnia, pt also thinks that it is effecting her heart. Please advise.  Pt c/o swelling: STAT is pt has developed SOB within 24 hours  If swelling, where is the swelling located? In legs more so ankles   How much weight have you gained and in what time span? Not sure no scale   Have you gained 3 pounds in a day or 5 pounds in a week? Not sure no scale   Do you have a log of your daily weights (if so, list)? No   Are you currently taking a fluid pill? yes  Are you currently SOB? Yes   Have you traveled recently? No

## 2021-02-14 NOTE — Telephone Encounter (Signed)
Returned call to patient of Dr. Sallyanne Kuster who reports swelling in bilateral LE (left worse than right, has cellulitis in left leg) and insomnia.   She was prescribed clindamycin 300mg  QID x10 days and she thinks this is making her sick She just started taking this med yesterday. She reports feeling "high" like she is on drugs. She reports pain in her leg that has cellulitis.   She reports tolerating lasix OK just makes her go to the bathroom a lot  Advised she contact the provider who prescribed clindamycin for her and she reports she does not plan to take this anymore. Explained her cellulitis will need to be treated.   Sent to MD as Juluis Rainier

## 2021-02-16 DIAGNOSIS — R6889 Other general symptoms and signs: Secondary | ICD-10-CM | POA: Diagnosis not present

## 2021-02-16 DIAGNOSIS — Z882 Allergy status to sulfonamides status: Secondary | ICD-10-CM | POA: Diagnosis not present

## 2021-02-16 DIAGNOSIS — M79605 Pain in left leg: Secondary | ICD-10-CM | POA: Diagnosis not present

## 2021-02-16 DIAGNOSIS — N179 Acute kidney failure, unspecified: Secondary | ICD-10-CM | POA: Diagnosis not present

## 2021-02-16 DIAGNOSIS — Z602 Problems related to living alone: Secondary | ICD-10-CM | POA: Diagnosis not present

## 2021-02-16 DIAGNOSIS — Z79899 Other long term (current) drug therapy: Secondary | ICD-10-CM | POA: Diagnosis not present

## 2021-02-16 DIAGNOSIS — I10 Essential (primary) hypertension: Secondary | ICD-10-CM | POA: Diagnosis not present

## 2021-02-16 DIAGNOSIS — Z743 Need for continuous supervision: Secondary | ICD-10-CM | POA: Diagnosis not present

## 2021-02-16 DIAGNOSIS — I509 Heart failure, unspecified: Secondary | ICD-10-CM | POA: Diagnosis not present

## 2021-02-16 DIAGNOSIS — Z880 Allergy status to penicillin: Secondary | ICD-10-CM | POA: Diagnosis not present

## 2021-02-16 DIAGNOSIS — L03116 Cellulitis of left lower limb: Secondary | ICD-10-CM | POA: Diagnosis not present

## 2021-02-16 DIAGNOSIS — Z950 Presence of cardiac pacemaker: Secondary | ICD-10-CM | POA: Diagnosis not present

## 2021-02-18 DIAGNOSIS — Z743 Need for continuous supervision: Secondary | ICD-10-CM | POA: Diagnosis not present

## 2021-02-18 DIAGNOSIS — L03116 Cellulitis of left lower limb: Secondary | ICD-10-CM | POA: Diagnosis not present

## 2021-02-18 DIAGNOSIS — R52 Pain, unspecified: Secondary | ICD-10-CM | POA: Diagnosis not present

## 2021-02-18 DIAGNOSIS — I10 Essential (primary) hypertension: Secondary | ICD-10-CM | POA: Diagnosis not present

## 2021-02-18 DIAGNOSIS — T887XXA Unspecified adverse effect of drug or medicament, initial encounter: Secondary | ICD-10-CM | POA: Diagnosis not present

## 2021-02-18 DIAGNOSIS — R531 Weakness: Secondary | ICD-10-CM | POA: Diagnosis not present

## 2021-02-18 DIAGNOSIS — R6889 Other general symptoms and signs: Secondary | ICD-10-CM | POA: Diagnosis not present

## 2021-02-18 DIAGNOSIS — R0902 Hypoxemia: Secondary | ICD-10-CM | POA: Diagnosis not present

## 2021-02-19 DIAGNOSIS — I442 Atrioventricular block, complete: Secondary | ICD-10-CM | POA: Diagnosis not present

## 2021-02-19 DIAGNOSIS — Z23 Encounter for immunization: Secondary | ICD-10-CM | POA: Diagnosis not present

## 2021-02-19 DIAGNOSIS — N183 Chronic kidney disease, stage 3 unspecified: Secondary | ICD-10-CM | POA: Diagnosis not present

## 2021-02-19 DIAGNOSIS — D649 Anemia, unspecified: Secondary | ICD-10-CM | POA: Diagnosis not present

## 2021-02-19 DIAGNOSIS — R5381 Other malaise: Secondary | ICD-10-CM | POA: Diagnosis not present

## 2021-02-19 DIAGNOSIS — Z950 Presence of cardiac pacemaker: Secondary | ICD-10-CM | POA: Diagnosis not present

## 2021-02-19 DIAGNOSIS — M79606 Pain in leg, unspecified: Secondary | ICD-10-CM | POA: Diagnosis not present

## 2021-02-19 DIAGNOSIS — Z743 Need for continuous supervision: Secondary | ICD-10-CM | POA: Diagnosis not present

## 2021-02-19 DIAGNOSIS — R410 Disorientation, unspecified: Secondary | ICD-10-CM | POA: Diagnosis not present

## 2021-02-19 DIAGNOSIS — S8012XA Contusion of left lower leg, initial encounter: Secondary | ICD-10-CM | POA: Diagnosis not present

## 2021-02-19 DIAGNOSIS — R109 Unspecified abdominal pain: Secondary | ICD-10-CM | POA: Diagnosis not present

## 2021-02-23 DIAGNOSIS — Z880 Allergy status to penicillin: Secondary | ICD-10-CM | POA: Diagnosis not present

## 2021-02-23 DIAGNOSIS — M7989 Other specified soft tissue disorders: Secondary | ICD-10-CM | POA: Diagnosis not present

## 2021-02-23 DIAGNOSIS — R609 Edema, unspecified: Secondary | ICD-10-CM | POA: Diagnosis not present

## 2021-02-23 DIAGNOSIS — Z743 Need for continuous supervision: Secondary | ICD-10-CM | POA: Diagnosis not present

## 2021-02-23 DIAGNOSIS — Z91041 Radiographic dye allergy status: Secondary | ICD-10-CM | POA: Diagnosis not present

## 2021-02-23 DIAGNOSIS — Z882 Allergy status to sulfonamides status: Secondary | ICD-10-CM | POA: Diagnosis not present

## 2021-02-23 DIAGNOSIS — L03116 Cellulitis of left lower limb: Secondary | ICD-10-CM | POA: Diagnosis not present

## 2021-02-23 DIAGNOSIS — B9562 Methicillin resistant Staphylococcus aureus infection as the cause of diseases classified elsewhere: Secondary | ICD-10-CM | POA: Diagnosis not present

## 2021-02-25 ENCOUNTER — Telehealth (HOSPITAL_BASED_OUTPATIENT_CLINIC_OR_DEPARTMENT_OTHER): Payer: Self-pay | Admitting: *Deleted

## 2021-02-25 ENCOUNTER — Telehealth: Payer: Self-pay | Admitting: Cardiovascular Disease

## 2021-02-25 NOTE — Telephone Encounter (Signed)
Patient was scheduled to see Nicki Reaper at [facility] office 10/26

## 2021-02-25 NOTE — Telephone Encounter (Signed)
Pt c/o Shortness Of Breath: STAT if SOB developed within the last 24 hours or pt is noticeably SOB on the phone  1. Are you currently SOB (can you hear that pt is SOB on the phone)? Yes   2. How long have you been experiencing SOB? About week and a half   3. Are you SOB when sitting or when up moving around? Both   4. Are you currently experiencing any other symptoms? Headaches, fatigue, no sleep

## 2021-02-25 NOTE — Telephone Encounter (Signed)
Patient called stating she can't make tomorrow's appt at 220pm.  Transportation can only bring her between 11am and 11:30am.

## 2021-02-25 NOTE — Telephone Encounter (Signed)
  Received stat call into triage from patient who states that she is experiencing increasing shortness of breath over the last 2 weeks. Patient states that she has noticed this shortness of breath has worsened over the last 2 weeks and she is very concerned. Patient states that she does not think that she has gained any weight but is unable to provide recent weights. Patient states she has been taking her Lasix 60mg  daily along with all other daily medications. Patient states that she was just seen in the ER for cellulitis in the Left leg and reports that she does still have swelling there and states she is also getting swelling in her right leg as well. Patient states that she is currently taking her antibiotics as prescribed at the ER visit. Patient denies any fever, chest pain, dizziness. Patient also states that she has had some palpitations as well intermittently. Patient denies any at present. Made patient an appointment tomorrow 10/25 with Laurann Montana, NP at the [facility] office. Patient is aware of appointment time and date, as well as location/address. Advised patient to call back with any issues with transportation. Also Made patient aware of ED precautions should new or worsening symptoms develop. Patient verbalized understanding.   Will forward to MD to make aware.

## 2021-02-25 NOTE — Telephone Encounter (Signed)
Patient called in needing transportation for office visit tomorrow, social services could not bring her Per patient she has used [facility] transportation before Gave her number to call [facility] transportation directly

## 2021-02-26 ENCOUNTER — Ambulatory Visit (HOSPITAL_BASED_OUTPATIENT_CLINIC_OR_DEPARTMENT_OTHER): Payer: Medicare Other | Admitting: Family

## 2021-02-26 NOTE — Progress Notes (Deleted)
Cardiology Office Note:    Date:  02/26/2021   ID:  Kimberly [name], DOB May 22, 1926, MRN [id number]  PCP:  Curlene Labrum, MD   [facility] Providers Cardiologist:  Sanda Klein, MD { Click to update primary MD,subspecialty MD or APP then REFRESH:1}  *** Referring MD: Curlene Labrum, MD   Chief Complaint:  No chief complaint on file. {Click here for Visit Info    :1}   Patient Profile:   Kimberly [name] is a 87 y.o. female with:  Permanent atrial fibrillation  S/p AVN ablation CHB S/p CRT-P (generator change 06/2020) (HFpEF) heart failure with preserved ejection fraction  Non-ischemic cardiomyopathy  EF 15-20 by LV gram, 40-45 by echocardiogram in 2007 EF returned to normal; echo 7/21: EF 55-60 Chronic kidney disease  Hypertension  Rheumatoid arthritis  Bipolar d/o Erosive esophagitis  History of Present Illness: Kimberly [name] was last seen in 4/22 by Jory Sims.  Since last seen, she has had multiple visits to the emergency room with abdominal pain, kidney stones, cellulitis, bug bites.  She did have a psychiatric evaluation on 1 occasion and was not deemed to be a threat to herself or others.  She called in recently with worsening shortness of breath.  She could not make her initial appointment due to transportation issues.  She is seen today for further evaluation.***  ASSESSMENT & PLAN:   No problem-specific Assessment & Plan notes found for this encounter.      {Are you ordering a CV Procedure (e.g. stress test, cath, DCCV, TEE, etc)?   Press F2        :[id number]}  Dispo:  No follow-ups on file.    Prior CV studies: Echocardiogram 11/21/19 EF 55-60, no RWMA, normal RVSF, RVSP 37.6 (mildly elevated), severe BAE, mild MR, mild-moderate AV sclerosis without stenosis  Myoview 11/29/14 EF > 65, normal perfusion, low risk  Cardiac catheterization 12/08/05 LM normal  LAD mid 30, min irregs LCx 30-40 at bifurcation, 40 after bifurcation RCA no disease  EF 15-20    Echocardiogram 12/04/05 EF 40-45 {Select studies to display:26339}    Past Medical History:  Diagnosis Date   Acute blood loss anemia 10/11/2012   Acute diverticulitis 08/24/2013   Acute on chronic combined systolic and diastolic CHF, NYHA class 4 ([place]) 11/15/2013   Antral ulcer 10/11/2012   Arrhythmia    atrial fibb   Atrial fibrillation (HCC)    Bipolar affective disorder ([place]) 03/23/2017   Cardiomyopathy, nonischemic (HCC)    Chronic anticoagulation 10/12/2012   CKD (chronic kidney disease) stage 3, GFR 30-59 ml/min (HCC) 10/12/2012   Depression    Erosive esophagitis 10/11/2012   Fibromyalgia    Glaucoma    H/O echocardiogram 2007   EF 40-45%,          Hypertension    Osteoarthritis    Pacemaker    Last saw cards 07/2013   Scoliosis    Current Medications: No outpatient medications have been marked as taking for the 02/27/21 encounter (Appointment) with Richardson Dopp T, PA-C.    Allergies:   Ciprofloxacin, Flagyl [metronidazole], Papaya derivatives, Iodine, Penicillins, and Sulfa antibiotics   Social History   Tobacco Use   Smoking status: Former    Packs/day: 0.50    Years: 30.00    Pack years: 15.00    Types: Cigarettes    Quit date: 12/13/2004    Years since quitting: 16.2   Smokeless tobacco: Never   Tobacco comments:    former smoker  Vaping Use   Vaping Use: Never used  Substance Use Topics   Alcohol use: Yes    Alcohol/week: 0.0 standard drinks    Comment: history of drinking a 1/2 glass of wine in the evening   Drug use: No    Family Hx: The patient's family history includes Asthma in her sister; Cancer in her brother and sister; Heart failure in her brother; Pulmonary embolism in her brother. There is no history of Colon cancer.  ROS   EKGs/Labs/Other Test Reviewed:    EKG:  EKG is *** ordered today.  The ekg ordered today demonstrates ***  Recent Labs: 06/19/2020: ALT 13; Magnesium 2.2 07/05/2020: Hemoglobin 10.9; Platelets 209 07/20/2020: BNP 260.2;  BUN 27; Creatinine, Ser 1.74; Potassium 4.3; Sodium 140   Recent Lipid Panel Lab Results  Component Value Date/Time   CHOL 179 03/15/2018 04:45 PM   TRIG 65 03/15/2018 04:45 PM   HDL 84 03/15/2018 04:45 PM   LDLCALC 82 03/15/2018 04:45 PM     Risk Assessment/Calculations:   {Does this patient have ATRIAL FIBRILLATION?:[id number]}      Physical Exam:    VS:  There were no vitals taken for this visit.    Wt Readings from Last 3 Encounters:  08/03/20 141 lb (64 kg)  07/26/20 141 lb 12.8 oz (64.3 kg)  07/20/20 139 lb 3.2 oz (63.1 kg)    Physical Exam ***    Medication Adjustments/Labs and Tests Ordered: Current medicines are reviewed at length with the patient today.  Concerns regarding medicines are outlined above.  Tests Ordered: No orders of the defined types were placed in this encounter.  Medication Changes: No orders of the defined types were placed in this encounter.  Signed, Richardson Dopp, PA-C  02/26/2021 10:42 PM    [facility] [address], [place], [place]  [address] Phone: [phone]; Fax: [phone]

## 2021-02-27 ENCOUNTER — Ambulatory Visit: Payer: Medicare Other | Admitting: Physician Assistant

## 2021-02-27 DIAGNOSIS — R0609 Other forms of dyspnea: Secondary | ICD-10-CM

## 2021-02-27 DIAGNOSIS — I442 Atrioventricular block, complete: Secondary | ICD-10-CM

## 2021-02-27 DIAGNOSIS — I5032 Chronic diastolic (congestive) heart failure: Secondary | ICD-10-CM

## 2021-02-27 DIAGNOSIS — I4821 Permanent atrial fibrillation: Secondary | ICD-10-CM

## 2021-02-27 DIAGNOSIS — N1832 Chronic kidney disease, stage 3b: Secondary | ICD-10-CM

## 2021-02-27 DIAGNOSIS — Z950 Presence of cardiac pacemaker: Secondary | ICD-10-CM

## 2021-03-04 ENCOUNTER — Telehealth: Payer: Self-pay | Admitting: Cardiovascular Disease

## 2021-03-04 DIAGNOSIS — R5381 Other malaise: Secondary | ICD-10-CM | POA: Diagnosis not present

## 2021-03-04 DIAGNOSIS — Z743 Need for continuous supervision: Secondary | ICD-10-CM | POA: Diagnosis not present

## 2021-03-04 DIAGNOSIS — L0390 Cellulitis, unspecified: Secondary | ICD-10-CM | POA: Diagnosis not present

## 2021-03-04 DIAGNOSIS — R609 Edema, unspecified: Secondary | ICD-10-CM | POA: Diagnosis not present

## 2021-03-04 DIAGNOSIS — R6889 Other general symptoms and signs: Secondary | ICD-10-CM | POA: Diagnosis not present

## 2021-03-04 NOTE — Telephone Encounter (Signed)
Pt c/o swelling: STAT is pt has developed SOB within 24 hours  If swelling, where is the swelling located? Both legs but left leg is worse. States they are leaking.  How much weight have you gained and in what time span? Not sure  Have you gained 3 pounds in a day or 5 pounds in a week? Not sure  Do you have a log of your daily weights (if so, list)? No, states her scale doesn't work properly  Are you currently taking a fluid pill? Yes, lasix  Are you currently SOB? States she is always SOB  Have you traveled recently? No

## 2021-03-04 NOTE — Telephone Encounter (Signed)
Called patient she stated she is still having pain and swelling in left lower leg.She went to ED 10/22 was told she has cellulitis.She has finished taking antibiotics.Advised she needs to call PCP.Stated she has appointment with PCP 11/2.

## 2021-03-05 DIAGNOSIS — I517 Cardiomegaly: Secondary | ICD-10-CM | POA: Diagnosis not present

## 2021-03-05 DIAGNOSIS — J449 Chronic obstructive pulmonary disease, unspecified: Secondary | ICD-10-CM | POA: Diagnosis not present

## 2021-03-05 DIAGNOSIS — J439 Emphysema, unspecified: Secondary | ICD-10-CM | POA: Diagnosis not present

## 2021-03-05 DIAGNOSIS — R600 Localized edema: Secondary | ICD-10-CM | POA: Diagnosis not present

## 2021-03-07 DIAGNOSIS — Z743 Need for continuous supervision: Secondary | ICD-10-CM | POA: Diagnosis not present

## 2021-03-07 DIAGNOSIS — I82402 Acute embolism and thrombosis of unspecified deep veins of left lower extremity: Secondary | ICD-10-CM | POA: Diagnosis not present

## 2021-03-07 DIAGNOSIS — I82452 Acute embolism and thrombosis of left peroneal vein: Secondary | ICD-10-CM | POA: Diagnosis not present

## 2021-03-07 DIAGNOSIS — S0990XA Unspecified injury of head, initial encounter: Secondary | ICD-10-CM | POA: Diagnosis not present

## 2021-03-07 DIAGNOSIS — E871 Hypo-osmolality and hyponatremia: Secondary | ICD-10-CM | POA: Diagnosis not present

## 2021-03-07 DIAGNOSIS — Z960 Presence of urogenital implants: Secondary | ICD-10-CM | POA: Diagnosis not present

## 2021-03-07 DIAGNOSIS — G319 Degenerative disease of nervous system, unspecified: Secondary | ICD-10-CM | POA: Diagnosis not present

## 2021-03-07 DIAGNOSIS — Z882 Allergy status to sulfonamides status: Secondary | ICD-10-CM | POA: Diagnosis not present

## 2021-03-07 DIAGNOSIS — L02612 Cutaneous abscess of left foot: Secondary | ICD-10-CM | POA: Diagnosis not present

## 2021-03-07 DIAGNOSIS — M1612 Unilateral primary osteoarthritis, left hip: Secondary | ICD-10-CM | POA: Diagnosis not present

## 2021-03-07 DIAGNOSIS — Z20822 Contact with and (suspected) exposure to covid-19: Secondary | ICD-10-CM | POA: Diagnosis not present

## 2021-03-07 DIAGNOSIS — R0600 Dyspnea, unspecified: Secondary | ICD-10-CM | POA: Diagnosis not present

## 2021-03-07 DIAGNOSIS — Z792 Long term (current) use of antibiotics: Secondary | ICD-10-CM | POA: Diagnosis not present

## 2021-03-07 DIAGNOSIS — I4819 Other persistent atrial fibrillation: Secondary | ICD-10-CM | POA: Diagnosis not present

## 2021-03-07 DIAGNOSIS — L02416 Cutaneous abscess of left lower limb: Secondary | ICD-10-CM | POA: Diagnosis not present

## 2021-03-07 DIAGNOSIS — Z880 Allergy status to penicillin: Secondary | ICD-10-CM | POA: Diagnosis not present

## 2021-03-07 DIAGNOSIS — M79605 Pain in left leg: Secondary | ICD-10-CM | POA: Diagnosis not present

## 2021-03-07 DIAGNOSIS — Z7901 Long term (current) use of anticoagulants: Secondary | ICD-10-CM | POA: Diagnosis not present

## 2021-03-07 DIAGNOSIS — I482 Chronic atrial fibrillation, unspecified: Secondary | ICD-10-CM | POA: Diagnosis not present

## 2021-03-07 DIAGNOSIS — M5136 Other intervertebral disc degeneration, lumbar region: Secondary | ICD-10-CM | POA: Diagnosis not present

## 2021-03-07 DIAGNOSIS — Z79899 Other long term (current) drug therapy: Secondary | ICD-10-CM | POA: Diagnosis not present

## 2021-03-07 DIAGNOSIS — I130 Hypertensive heart and chronic kidney disease with heart failure and stage 1 through stage 4 chronic kidney disease, or unspecified chronic kidney disease: Secondary | ICD-10-CM | POA: Diagnosis not present

## 2021-03-07 DIAGNOSIS — Z91041 Radiographic dye allergy status: Secondary | ICD-10-CM | POA: Diagnosis not present

## 2021-03-07 DIAGNOSIS — W19XXXA Unspecified fall, initial encounter: Secondary | ICD-10-CM | POA: Diagnosis not present

## 2021-03-07 DIAGNOSIS — Z881 Allergy status to other antibiotic agents status: Secondary | ICD-10-CM | POA: Diagnosis not present

## 2021-03-07 DIAGNOSIS — L03116 Cellulitis of left lower limb: Secondary | ICD-10-CM | POA: Diagnosis not present

## 2021-03-07 DIAGNOSIS — Z885 Allergy status to narcotic agent status: Secondary | ICD-10-CM | POA: Diagnosis not present

## 2021-03-07 DIAGNOSIS — M6281 Muscle weakness (generalized): Secondary | ICD-10-CM | POA: Diagnosis not present

## 2021-03-07 DIAGNOSIS — R6889 Other general symptoms and signs: Secondary | ICD-10-CM | POA: Diagnosis not present

## 2021-03-07 DIAGNOSIS — D72829 Elevated white blood cell count, unspecified: Secondary | ICD-10-CM | POA: Diagnosis not present

## 2021-03-07 DIAGNOSIS — I5032 Chronic diastolic (congestive) heart failure: Secondary | ICD-10-CM | POA: Diagnosis not present

## 2021-03-07 DIAGNOSIS — D631 Anemia in chronic kidney disease: Secondary | ICD-10-CM | POA: Diagnosis not present

## 2021-03-07 DIAGNOSIS — I4891 Unspecified atrial fibrillation: Secondary | ICD-10-CM | POA: Diagnosis not present

## 2021-03-07 DIAGNOSIS — N183 Chronic kidney disease, stage 3 unspecified: Secondary | ICD-10-CM | POA: Diagnosis not present

## 2021-03-07 DIAGNOSIS — I517 Cardiomegaly: Secondary | ICD-10-CM | POA: Diagnosis not present

## 2021-03-07 DIAGNOSIS — Z602 Problems related to living alone: Secondary | ICD-10-CM | POA: Diagnosis not present

## 2021-03-07 DIAGNOSIS — Z9109 Other allergy status, other than to drugs and biological substances: Secondary | ICD-10-CM | POA: Diagnosis not present

## 2021-03-07 DIAGNOSIS — R2689 Other abnormalities of gait and mobility: Secondary | ICD-10-CM | POA: Diagnosis not present

## 2021-03-07 DIAGNOSIS — I509 Heart failure, unspecified: Secondary | ICD-10-CM | POA: Diagnosis not present

## 2021-03-07 DIAGNOSIS — Z043 Encounter for examination and observation following other accident: Secondary | ICD-10-CM | POA: Diagnosis not present

## 2021-03-07 DIAGNOSIS — I4811 Longstanding persistent atrial fibrillation: Secondary | ICD-10-CM | POA: Diagnosis not present

## 2021-03-07 DIAGNOSIS — R339 Retention of urine, unspecified: Secondary | ICD-10-CM | POA: Diagnosis not present

## 2021-03-07 DIAGNOSIS — D638 Anemia in other chronic diseases classified elsewhere: Secondary | ICD-10-CM | POA: Diagnosis not present

## 2021-03-07 DIAGNOSIS — Z9049 Acquired absence of other specified parts of digestive tract: Secondary | ICD-10-CM | POA: Diagnosis not present

## 2021-03-07 DIAGNOSIS — W1839XA Other fall on same level, initial encounter: Secondary | ICD-10-CM | POA: Diagnosis not present

## 2021-03-07 DIAGNOSIS — N189 Chronic kidney disease, unspecified: Secondary | ICD-10-CM | POA: Diagnosis not present

## 2021-03-07 DIAGNOSIS — I6782 Cerebral ischemia: Secondary | ICD-10-CM | POA: Diagnosis not present

## 2021-03-07 DIAGNOSIS — Z950 Presence of cardiac pacemaker: Secondary | ICD-10-CM | POA: Diagnosis not present

## 2021-03-14 DIAGNOSIS — I4891 Unspecified atrial fibrillation: Secondary | ICD-10-CM | POA: Diagnosis not present

## 2021-03-14 DIAGNOSIS — R2689 Other abnormalities of gait and mobility: Secondary | ICD-10-CM | POA: Diagnosis not present

## 2021-03-14 DIAGNOSIS — K219 Gastro-esophageal reflux disease without esophagitis: Secondary | ICD-10-CM | POA: Diagnosis not present

## 2021-03-14 DIAGNOSIS — M6281 Muscle weakness (generalized): Secondary | ICD-10-CM | POA: Diagnosis not present

## 2021-03-14 DIAGNOSIS — L03116 Cellulitis of left lower limb: Secondary | ICD-10-CM | POA: Diagnosis not present

## 2021-03-14 DIAGNOSIS — I5041 Acute combined systolic (congestive) and diastolic (congestive) heart failure: Secondary | ICD-10-CM | POA: Diagnosis not present

## 2021-03-14 DIAGNOSIS — W19XXXA Unspecified fall, initial encounter: Secondary | ICD-10-CM | POA: Diagnosis not present

## 2021-03-14 DIAGNOSIS — I5032 Chronic diastolic (congestive) heart failure: Secondary | ICD-10-CM | POA: Diagnosis not present

## 2021-03-14 DIAGNOSIS — I82452 Acute embolism and thrombosis of left peroneal vein: Secondary | ICD-10-CM | POA: Diagnosis not present

## 2021-03-14 DIAGNOSIS — I4811 Longstanding persistent atrial fibrillation: Secondary | ICD-10-CM | POA: Diagnosis not present

## 2021-03-14 DIAGNOSIS — Z7901 Long term (current) use of anticoagulants: Secondary | ICD-10-CM | POA: Diagnosis not present

## 2021-03-14 DIAGNOSIS — D72829 Elevated white blood cell count, unspecified: Secondary | ICD-10-CM | POA: Diagnosis not present

## 2021-03-14 DIAGNOSIS — I509 Heart failure, unspecified: Secondary | ICD-10-CM | POA: Diagnosis not present

## 2021-03-14 DIAGNOSIS — I82402 Acute embolism and thrombosis of unspecified deep veins of left lower extremity: Secondary | ICD-10-CM | POA: Diagnosis not present

## 2021-03-14 DIAGNOSIS — L0390 Cellulitis, unspecified: Secondary | ICD-10-CM | POA: Diagnosis not present

## 2021-03-16 DIAGNOSIS — I4891 Unspecified atrial fibrillation: Secondary | ICD-10-CM | POA: Diagnosis not present

## 2021-03-16 DIAGNOSIS — K219 Gastro-esophageal reflux disease without esophagitis: Secondary | ICD-10-CM | POA: Diagnosis not present

## 2021-03-16 DIAGNOSIS — I5041 Acute combined systolic (congestive) and diastolic (congestive) heart failure: Secondary | ICD-10-CM | POA: Diagnosis not present

## 2021-03-16 DIAGNOSIS — L0390 Cellulitis, unspecified: Secondary | ICD-10-CM | POA: Diagnosis not present

## 2021-03-18 ENCOUNTER — Encounter: Payer: Medicare Other | Admitting: Cardiovascular Disease

## 2021-03-23 NOTE — Progress Notes (Signed)
VASCULAR AND VEIN SPECIALISTS OF [place]  ASSESSMENT / PLAN: 87 y.o. female with acute peroneal DVT diagnosed at [facility] earlier this month.  On repeat imaging, the patient has no evidence of acute peroneal DVT.  No role for anticoagulation or  inferior vena cava filter.  Follow-up with me as needed.  CHIEF COMPLAINT: Acute DVT, relative contraindication to anticoagulation  HISTORY OF PRESENT ILLNESS: Kimberly [name] is a 87 y.o. female referred to the clinic for evaluation of diagnosis of acute left peroneal vein deep venous thrombosis earlier this month at [facility].  The patient is a very pleasant 87 year old woman who presents to [place] from a skilled nursing facility after her admission for left lower extremity cellulitis.  During her admission to [facility] a venous duplex was performed of her left lower extremity showing acute deep venous thrombosis in the left peroneal vein.  The patient is relatively asymptomatic from this.  She does have some mild residual cellulitis about her left calf.  Despite her advanced age she is quite spry and was previously living independently.  She plans to continue to do so after discharge from skilled nursing facility.    Past Medical History:  Diagnosis Date   Acute blood loss anemia 10/11/2012   Acute diverticulitis 08/24/2013   Acute on chronic combined systolic and diastolic CHF, NYHA class 4 (HCC) 11/15/2013   Antral ulcer 10/11/2012   Arrhythmia    atrial fibb   Atrial fibrillation (HCC)    Bipolar affective disorder ([place]) 03/23/2017   Cardiomyopathy, nonischemic (HCC)    Chronic anticoagulation 10/12/2012   CKD (chronic kidney disease) stage 3, GFR 30-59 ml/min ([place]) 10/12/2012   Depression    Erosive esophagitis 10/11/2012   Fibromyalgia    Glaucoma    H/O echocardiogram 2007   EF 40-45%,          Hypertension    Osteoarthritis    Pacemaker    Last saw cards 07/2013   Scoliosis     Past Surgical History:  Procedure  Laterality Date   ABDOMINAL HYSTERECTOMY     APPENDECTOMY     BACK SURGERY     BIOPSY  03/03/2017   Procedure: BIOPSY;  Surgeon: Danie Binder, MD;  [facility];  Service: Endoscopy;;  gastric   BREAST SURGERY     CARDIAC CATHETERIZATION  12/08/2005   LAD AND LEFT MAIN WITH NO HIGH-GRADE STENOSIS. MILD DISEASE IN THE CX AND LAD SYSTEM. SEVERE LV DYSFUNCTION WITH DILATION OF THE LV. EF 15-20%. LV END-DIASTOLIC PRESSURE IS 90. +1 MR.   CHOLECYSTECTOMY     COLONOSCOPY N/A 03/03/2017   Dr. Oneida Alar: 5 colon polyps removed, adenomatous.  Diverticulosis.  99% of the colon was cleared but the cecum was not adequately seen.   CYSTOSCOPY N/A 02/24/2013   Procedure: CYSTOSCOPY WITH URETHRAL DILITATION;  Surgeon: Marissa Nestle, MD;  Location: [facility];  Service: Urology;  Laterality: N/A;   DOPPLER ECHOCARDIOGRAPHY N/A 05/30/2010   LV SIZE IS NORMAL. LV SYSTOLIC FUNCTION IS LOW NORMAL. EF=50-55%. MILD INFERIOR HYPOKINESIS.MILD TO MODERATE POSTERIOR WALL HYPOKINESIS.PACEMAKER LEAD IN THE RV. LA IS MILDLY DILATED. RA IS MODERATE TO SEVERLY DILATED. PACEMAKER LEAD IN THE RA. MILD CALCICICATION OF THE MV APPARATUS. MODERATE MR. MILD TO MODERATE TR. MILD PHTN.AV MILDLY SCLEROTIC.   ESOPHAGOGASTRODUODENOSCOPY N/A 10/13/2012   Dr. Gala Romney: severe ulcerative reflux esophagitis, question of Barrett's but negative path, single deep prepyloric antral ulcer, negative H.pylori   ESOPHAGOGASTRODUODENOSCOPY N/A 03/03/2017   Dr. Oneida Alar:  Gastritis/duodenitis, no H. pylori   HERNIA REPAIR     right inguinal hernia and umbilical   LOWETR EXT VENOUS Bilateral 11-08-10   R & L- NO EVIDENCE OF THROMBUS OR THROMBOPHLEBITIS. THERE IS MILD AMOUNT OF SUBCUTANEOUS EDEMA NOTED WITHIN THE LEFT CALF AND ANKLE. R & L GSV AND SSV- NO VENOUS INSUFF NOTED.   NECK SURGERY     NUCLEAR STRESS TEST N/A 02/13/2009   NORMAL PATTERN OF PERFUSION IN ALL REGIONS. POST STRESS VENTICULAR SIZE IS NORMAL. POST  STESS EF 85%.  NORMAL  MYOCARDIAL PERFUSION STUDY.   OPEN REDUCTION INTERNAL FIXATION (ORIF) DISTAL PHALANX Left 11/16/2018   Procedure: MIDDLE FINGER OPEN REDUCTION VERSUS RECONSTRUCTION;  Surgeon: Roseanne Kaufman, MD;  Location: [place];  Service: Orthopedics;  Laterality: Left;   PACEMAKER INSERTION     POLYPECTOMY  03/03/2017   Procedure: POLYPECTOMY;  Surgeon: Danie Binder, MD;  [facility];  Service: Endoscopy;;  colon   PPM GENERATOR CHANGEOUT N/A 06/18/2020   Procedure: PPM GENERATOR CHANGEOUT;  Surgeon: Sanda Klein, MD;  Location: [place] [facility];  Service: Cardiovascular;  Laterality: N/A;   TONSILLECTOMY     YAG LASER APPLICATION Bilateral 5/46/5681   Procedure: YAG LASER APPLICATION;  Surgeon: Williams Che, MD;  Location: [facility];  Service: Ophthalmology;  Laterality: Bilateral;    Family History  Problem Relation Age of Onset   Cancer Sister    Asthma Sister    Heart failure Brother    Pulmonary embolism Brother    Cancer Brother    Colon cancer Neg Hx     Social History   Socioeconomic History   Marital status: Widowed    Spouse name: Not on file   Number of children: Not on file   Years of education: Not on file   Highest education level: Some college, no degree  Occupational History   Occupation: Primary school teacher    Employer: RETIRED    Comment: retired  Tobacco Use   Smoking status: Former    Packs/day: 0.50    Years: 30.00    Pack years: 15.00    Types: Cigarettes    Quit date: 12/13/2004    Years since quitting: 16.2   Smokeless tobacco: Never   Tobacco comments:    former smoker  Vaping Use   Vaping Use: Never used  Substance and Sexual Activity   Alcohol use: Yes    Alcohol/week: 0.0 standard drinks    Comment: history of drinking a 1/2 glass of wine in the evening   Drug use: No   Sexual activity: Never  Other Topics Concern   Not on file  Social History Narrative   No home exercise program. PT ordered this week.   Social Determinants of  Health   Financial Resource Strain: Low Risk    Difficulty of Paying Living Expenses: Not very hard  Food Insecurity: No Food Insecurity   Worried About Charity fundraiser in the Last Year: Never true   Ran Out of Food in the Last Year: Never true  Transportation Needs: Unmet Transportation Needs   Lack of Transportation (Medical): Yes   Lack of Transportation (Non-Medical): No  Physical Activity: Not on file  Stress: Not on file  Social Connections: Not on file  Intimate Partner Violence: Not on file    Allergies  Allergen Reactions   Ciprofloxacin Other (See Comments)    Possibly caused diarrhea November 2018   Flagyl [Metronidazole] Other (See Comments)  Possibly caused diarrhea November 2018   Papaya Derivatives Hives   Iodine Rash and Other (See Comments)    REACTION:If injected,  Rash/irritated skin reaction "welts"   Penicillins Hives    DID THE REACTION INVOLVE: Swelling of the face/tongue/throat, SOB, or low BP? Unknown Sudden or severe rash/hives, skin peeling, or the inside of the mouth or nose? Yes Did it require medical treatment? Yes When did it last happen?    84 or 87 years old   If all above answers are "NO", may proceed with cephalosporin use.   Sulfa Antibiotics Rash    Current Outpatient Medications  Medication Sig Dispense Refill   acetaminophen (TYLENOL) 325 MG tablet Take 325 mg by mouth as needed for headache.     CALCIUM CARBONATE ANTACID PO Take 1 tablet by mouth daily as needed for heartburn or indigestion.     carvedilol (COREG) 6.25 MG tablet TAKE 1 TABLET BY MOUTH  TWICE DAILY WITH A MEAL 180 tablet 3   furosemide (LASIX) 40 MG tablet Take 1.5 tablets (60 mg total) by mouth daily. 135 tablet 2   Multiple Vitamins-Minerals (PRESERVISION AREDS 2) CAPS Take 1 capsule by mouth 2 (two) times daily.      pantoprazole (PROTONIX) 40 MG tablet Take 1 tablet (40 mg total) by mouth daily before breakfast. 30 tablet 3   Polyethylene Glycol 400 (BLINK  TEARS) 0.25 % SOLN Place 1 drop into both eyes daily as needed (for dry eye relief).     Potassium Chloride ER 20 MEQ TBCR Take 20 mEq by mouth daily. 30 tablet 3   apixaban (ELIQUIS) 2.5 MG TABS tablet Take 1 tablet (2.5 mg total) by mouth 2 (two) times daily. (Patient not taking: Reported on 03/26/2021) 180 tablet 1   No current facility-administered medications for this visit.    REVIEW OF SYSTEMS:  [X]  denotes positive finding, [ ]  denotes negative finding Cardiac  Comments:  Chest pain or chest pressure:    Shortness of breath upon exertion:    Short of breath when lying flat:    Irregular heart rhythm:        Vascular    Pain in calf, thigh, or hip brought on by ambulation:    Pain in feet at night that wakes you up from your sleep:     Blood clot in your veins:    Leg swelling:         Pulmonary    Oxygen at home:    Productive cough:     Wheezing:         Neurologic    Sudden weakness in arms or legs:     Sudden numbness in arms or legs:     Sudden onset of difficulty speaking or slurred speech:    Temporary loss of vision in one eye:     Problems with dizziness:         Gastrointestinal    Blood in stool:     Vomited blood:         Genitourinary    Burning when urinating:     Blood in urine:        Psychiatric    Major depression:         Hematologic    Bleeding problems:    Problems with blood clotting too easily:        Skin    Rashes or ulcers:        Constitutional    Fever or chills:  PHYSICAL EXAM Vitals:   03/26/21 1427  BP: (!) 151/79  Pulse: 60  Resp: 14  Temp: (!) 97.3 F (36.3 C)  TempSrc: Temporal  SpO2: 97%  Weight: 135 lb (61.2 kg)  Height: 5\' 8"  (1.727 m)    Constitutional: Elderly. No distress.  Appears marginally nourished.  Neurologic: CN intact. no focal findings. no sensory loss. Psychiatric:  Mood and affect symmetric and appropriate. Eyes: no icterus. No conjunctival pallor. Ears, nose, throat: No mucous  membranes moist. Midline trachea.  Cardiac: Regular rate and rhythm.  Respiratory: unlabored. Abdominal:  soft, non-tender, non-distended.  Peripheral vascular: Left lower extremity mildly erythematous, consistent with resolving cellulitis about the lateral calf.  She has weakly palpable pedal pulses bilaterally. Extremity: Mild bilateral lower extremity edema. no cyanosis.  No pallor.  Skin: No  gangrene. No  ulceration.  Lymphatic: No  Stemmer's sign. No  palpable lymphadenopathy.  PERTINENT LABORATORY AND RADIOLOGIC DATA  Most recent CBC CBC Latest Ref Rng & Units 07/05/2020 06/19/2020 06/18/2020  WBC 3.4 - 10.8 x10E3/uL 5.9 8.2 7.7  Hemoglobin 11.1 - 15.9 g/dL 10.9(L) 10.2(L) 10.5(L)  Hematocrit 34.0 - 46.6 % 33.2(L) 29.8(L) 33.1(L)  Platelets 150 - 450 x10E3/uL 209 137(L) 155     Most recent CMP CMP Latest Ref Rng & Units 07/20/2020 07/05/2020 06/19/2020  Glucose 65 - 99 mg/dL 95 86 105(H)  BUN 10 - 36 mg/dL 27 19 32(H)  Creatinine 0.57 - 1.00 mg/dL 1.74(H) 1.48(H) 1.96(H)  Sodium 134 - 144 mmol/L 140 139 136  Potassium 3.5 - 5.2 mmol/L 4.3 3.5 4.0  Chloride 96 - 106 mmol/L 96 102 102  CO2 20 - 29 mmol/L 22 18(L) 24  Calcium 8.7 - 10.3 mg/dL 10.3 9.2 8.1(L)  Total Protein 6.5 - 8.1 g/dL - - 5.5(L)  Total Bilirubin 0.3 - 1.2 mg/dL - - 1.1  Alkaline Phos 38 - 126 U/L - - 41  AST 15 - 41 U/L - - 15  ALT 0 - 44 U/L - - 13    Renal function CrCl cannot be calculated (Patient's most recent lab result is older than the maximum 21 days allowed.).  Hgb A1c MFr Bld (%)  Date Value  12/15/2011 5.8 (H)    LDL Calculated  Date Value Ref Range Status  03/15/2018 82 0 - 99 mg/dL Final    Follow-up venous duplex performed today in the office suggests resolution of single peroneal acute venous DVT on the left.  She has evidence of chronic DVT in the left posterior tibial vein.  Kimberly [name]. Stanford Breed, MD Vascular [facility] Vein Specialists of [facility] Phone Number: [phone] 03/26/2021 6:11 PM  Total time spent on preparing this encounter including chart review, data review, collecting history, examining the patient, coordinating care for this new patient, 60 minutes.  Portions of this report may have been transcribed using voice recognition software.  Every effort has been made to ensure accuracy; however, inadvertent computerized transcription errors may still be present.

## 2021-03-26 ENCOUNTER — Ambulatory Visit (HOSPITAL_COMMUNITY)
Admission: RE | Admit: 2021-03-26 | Discharge: 2021-03-26 | Disposition: A | Discharge status: Home / Self Care | Payer: Medicare Other | Source: Ambulatory Visit | Attending: Vascular Surgery | Admitting: Vascular Surgery

## 2021-03-26 ENCOUNTER — Ambulatory Visit (INDEPENDENT_AMBULATORY_CARE_PROVIDER_SITE_OTHER): Payer: Medicare Other | Admitting: Vascular Surgery

## 2021-03-26 ENCOUNTER — Other Ambulatory Visit: Payer: Self-pay

## 2021-03-26 ENCOUNTER — Encounter: Payer: Self-pay | Admitting: Vascular Surgery

## 2021-03-26 VITALS — BP 151/79 | HR 60 | Temp 97.3°F | Resp 14 | Ht 68.0 in | Wt 135.0 lb

## 2021-03-26 DIAGNOSIS — I82452 Acute embolism and thrombosis of left peroneal vein: Secondary | ICD-10-CM | POA: Diagnosis not present

## 2021-04-02 ENCOUNTER — Ambulatory Visit (INDEPENDENT_AMBULATORY_CARE_PROVIDER_SITE_OTHER): Payer: Medicare Other

## 2021-04-02 DIAGNOSIS — I442 Atrioventricular block, complete: Secondary | ICD-10-CM | POA: Diagnosis not present

## 2021-04-02 LAB — CUP PACEART REMOTE DEVICE CHECK
Battery Remaining Longevity: 94 mo
Battery Voltage: 3.01 V
Brady Statistic AP VP Percent: 0 %
Brady Statistic AP VS Percent: 0 %
Brady Statistic AS VP Percent: 99.65 %
Brady Statistic AS VS Percent: 0.35 %
Brady Statistic RA Percent Paced: 0 %
Brady Statistic RV Percent Paced: 99.64 %
Date Time Interrogation Session: 20221128143751
Implantable Lead Implant Date: 19971124
Implantable Lead Implant Date: 20070831
Implantable Lead Location: 753858
Implantable Lead Location: 753860
Implantable Lead Model: 4024
Implantable Lead Model: 4194
Implantable Pulse Generator Implant Date: 20220214
Lead Channel Impedance Value: 3363 Ohm
Lead Channel Impedance Value: 3363 Ohm
Lead Channel Impedance Value: 437 Ohm
Lead Channel Impedance Value: 456 Ohm
Lead Channel Impedance Value: 456 Ohm
Lead Channel Impedance Value: 513 Ohm
Lead Channel Impedance Value: 627 Ohm
Lead Channel Impedance Value: 703 Ohm
Lead Channel Impedance Value: 893 Ohm
Lead Channel Pacing Threshold Amplitude: 0.625 V
Lead Channel Pacing Threshold Amplitude: 1.5 V
Lead Channel Pacing Threshold Pulse Width: 0.4 ms
Lead Channel Pacing Threshold Pulse Width: 1 ms
Lead Channel Sensing Intrinsic Amplitude: 0.25 mV
Lead Channel Sensing Intrinsic Amplitude: 0.25 mV
Lead Channel Setting Pacing Amplitude: 2.5 V
Lead Channel Setting Pacing Amplitude: 2.5 V
Lead Channel Setting Pacing Pulse Width: 0.4 ms
Lead Channel Setting Pacing Pulse Width: 1 ms
Lead Channel Setting Sensing Sensitivity: 0.9 mV

## 2021-04-11 NOTE — Progress Notes (Signed)
Remote pacemaker transmission.   

## 2021-04-15 ENCOUNTER — Other Ambulatory Visit: Payer: Self-pay | Admitting: *Deleted

## 2021-04-15 NOTE — Patient Outreach (Addendum)
[place] [facility]) Care Management  04/15/2021  Kimberly [name] [birth year] [id number]   Received called from Dr. Joselyn Arrow office that Kimberly [name] had missed an appt on Dec 5th and they have not been able to reach her. Advised I was not involved with her at this time but I would be glad to try to contact her. Called pt and did not get an answer. Called secondary number which was Kimberly [name], SW at [place] who advised that pt had been at [facility] and then [facility]. Kimberly [name] states she is not aware if pt is still there or has been discharged. Called [facility] and no one picked up the phone in the Rehab area. Called Kimberly [name] again and asked if she could do a welfare check and she is not working today and advised to call the Police. [employer] and requested a welfare check which they said they would do.   Officer called minutes later reporting that Kimberly [name] is OK she has the Education officer, museum with her at present and all seems to be well. Requested that the office tell Kimberly [name] to call me as she knows me from previous care management services.  Kimberly Pont. Myrtie Neither, MSN, GNP-BC Gerontological Nurse Practitioner [facility] Care Management [phone]  Kimberly [name] called me and advised she is fine and her main SW from [place], [employer], had been with her today. Pt reports she no longer sees Dr. Pleas Koch as he dismissed her for missing appts. She says she is going to see another MD but she cannot recall the name. Her SW set her up for this. This is not indicated in Epic. Requested that pt let me know when she gets the info from SW about the MD and when she has an appt. She said she would.  Kimberly Pont. Myrtie Neither, MSN, [facility] Gerontological Nurse Practitioner [facility] Care Management [phone]

## 2021-05-02 ENCOUNTER — Other Ambulatory Visit: Payer: Self-pay | Admitting: Cardiovascular Disease

## 2021-05-24 ENCOUNTER — Emergency Department (HOSPITAL_COMMUNITY)
Admission: EM | Admit: 2021-05-24 | Discharge: 2021-05-25 | Disposition: A | Discharge status: Home / Self Care | Payer: Medicare Other | Attending: Emergency Medicine | Admitting: Emergency Medicine

## 2021-05-24 ENCOUNTER — Encounter (HOSPITAL_COMMUNITY): Payer: Self-pay | Admitting: Emergency Medicine

## 2021-05-24 ENCOUNTER — Emergency Department (HOSPITAL_COMMUNITY): Payer: Medicare Other

## 2021-05-24 ENCOUNTER — Other Ambulatory Visit: Payer: Self-pay

## 2021-05-24 DIAGNOSIS — R1084 Generalized abdominal pain: Secondary | ICD-10-CM | POA: Diagnosis not present

## 2021-05-24 DIAGNOSIS — R140 Abdominal distension (gaseous): Secondary | ICD-10-CM | POA: Diagnosis not present

## 2021-05-24 DIAGNOSIS — I130 Hypertensive heart and chronic kidney disease with heart failure and stage 1 through stage 4 chronic kidney disease, or unspecified chronic kidney disease: Secondary | ICD-10-CM | POA: Insufficient documentation

## 2021-05-24 DIAGNOSIS — R1030 Lower abdominal pain, unspecified: Secondary | ICD-10-CM | POA: Insufficient documentation

## 2021-05-24 DIAGNOSIS — R6889 Other general symptoms and signs: Secondary | ICD-10-CM | POA: Diagnosis not present

## 2021-05-24 DIAGNOSIS — N189 Chronic kidney disease, unspecified: Secondary | ICD-10-CM | POA: Diagnosis not present

## 2021-05-24 DIAGNOSIS — I509 Heart failure, unspecified: Secondary | ICD-10-CM | POA: Diagnosis not present

## 2021-05-24 DIAGNOSIS — I4891 Unspecified atrial fibrillation: Secondary | ICD-10-CM | POA: Diagnosis not present

## 2021-05-24 DIAGNOSIS — K5730 Diverticulosis of large intestine without perforation or abscess without bleeding: Secondary | ICD-10-CM | POA: Diagnosis not present

## 2021-05-24 DIAGNOSIS — Z743 Need for continuous supervision: Secondary | ICD-10-CM | POA: Diagnosis not present

## 2021-05-24 DIAGNOSIS — R109 Unspecified abdominal pain: Secondary | ICD-10-CM | POA: Diagnosis not present

## 2021-05-24 DIAGNOSIS — Z79899 Other long term (current) drug therapy: Secondary | ICD-10-CM | POA: Insufficient documentation

## 2021-05-24 DIAGNOSIS — K8689 Other specified diseases of pancreas: Secondary | ICD-10-CM | POA: Diagnosis not present

## 2021-05-24 DIAGNOSIS — Z950 Presence of cardiac pacemaker: Secondary | ICD-10-CM | POA: Diagnosis not present

## 2021-05-24 DIAGNOSIS — N200 Calculus of kidney: Secondary | ICD-10-CM | POA: Diagnosis not present

## 2021-05-24 DIAGNOSIS — K3189 Other diseases of stomach and duodenum: Secondary | ICD-10-CM | POA: Diagnosis not present

## 2021-05-24 DIAGNOSIS — Z7901 Long term (current) use of anticoagulants: Secondary | ICD-10-CM | POA: Diagnosis not present

## 2021-05-24 DIAGNOSIS — K7689 Other specified diseases of liver: Secondary | ICD-10-CM | POA: Diagnosis not present

## 2021-05-24 LAB — COMPREHENSIVE METABOLIC PANEL
ALT: 11 U/L (ref 0–44)
AST: 18 U/L (ref 15–41)
Albumin: 3.4 g/dL — ABNORMAL LOW (ref 3.5–5.0)
Alkaline Phosphatase: 49 U/L (ref 38–126)
Anion gap: 10 (ref 5–15)
BUN: 21 mg/dL (ref 8–23)
CO2: 26 mmol/L (ref 22–32)
Calcium: 8.6 mg/dL — ABNORMAL LOW (ref 8.9–10.3)
Chloride: 103 mmol/L (ref 98–111)
Creatinine, Ser: 1.08 mg/dL — ABNORMAL HIGH (ref 0.44–1.00)
GFR, Estimated: 48 mL/min — ABNORMAL LOW (ref >60)
Glucose, Bld: 97 mg/dL (ref 70–99)
Potassium: 3.3 mmol/L — ABNORMAL LOW (ref 3.5–5.1)
Sodium: 139 mmol/L (ref 135–145)
Total Bilirubin: 0.6 mg/dL (ref 0.3–1.2)
Total Protein: 6.3 g/dL — ABNORMAL LOW (ref 6.5–8.1)

## 2021-05-24 LAB — CBC WITH DIFFERENTIAL/PLATELET
Abs Immature Granulocytes: 0.05 10*3/uL (ref 0.00–0.07)
Basophils Absolute: 0 10*3/uL (ref 0.0–0.1)
Basophils Relative: 0 %
Eosinophils Absolute: 0.1 10*3/uL (ref 0.0–0.5)
Eosinophils Relative: 1 %
HCT: 31 % — ABNORMAL LOW (ref 36.0–46.0)
Hemoglobin: 9.4 g/dL — ABNORMAL LOW (ref 12.0–15.0)
Immature Granulocytes: 1 %
Lymphocytes Relative: 14 %
Lymphs Abs: 1.1 10*3/uL (ref 0.7–4.0)
MCH: 27.8 pg (ref 26.0–34.0)
MCHC: 30.3 g/dL (ref 30.0–36.0)
MCV: 91.7 fL (ref 80.0–100.0)
Monocytes Absolute: 0.6 10*3/uL (ref 0.1–1.0)
Monocytes Relative: 7 %
Neutro Abs: 6.4 10*3/uL (ref 1.7–7.7)
Neutrophils Relative %: 77 %
Platelets: 200 10*3/uL (ref 150–400)
RBC: 3.38 MIL/uL — ABNORMAL LOW (ref 3.87–5.11)
RDW: 18 % — ABNORMAL HIGH (ref 11.5–15.5)
WBC: 8.3 10*3/uL (ref 4.0–10.5)
nRBC: 0 % (ref 0.0–0.2)

## 2021-05-24 LAB — LIPASE, BLOOD: Lipase: 45 U/L (ref 11–51)

## 2021-05-24 LAB — URINALYSIS, ROUTINE W REFLEX MICROSCOPIC
Bilirubin Urine: NEGATIVE
Glucose, UA: NEGATIVE mg/dL
Hgb urine dipstick: NEGATIVE
Ketones, ur: 5 mg/dL — AB
Leukocytes,Ua: NEGATIVE
Nitrite: NEGATIVE
Protein, ur: NEGATIVE mg/dL
Specific Gravity, Urine: 1.006 (ref 1.005–1.030)
pH: 7 (ref 5.0–8.0)

## 2021-05-24 MED ORDER — DICYCLOMINE HCL 10 MG PO CAPS
10.0000 mg | ORAL_CAPSULE | Freq: Once | ORAL | Status: AC
  Administered 2021-05-24: 10 mg via ORAL
  Filled 2021-05-24: qty 1

## 2021-05-24 MED ORDER — POTASSIUM CHLORIDE CRYS ER 20 MEQ PO TBCR
40.0000 meq | EXTENDED_RELEASE_TABLET | Freq: Once | ORAL | Status: AC
  Administered 2021-05-24: 40 meq via ORAL
  Filled 2021-05-24: qty 2

## 2021-05-24 MED ORDER — DICYCLOMINE HCL 10 MG PO CAPS: 10.0000 mg | ORAL_CAPSULE | Freq: Three times a day (TID) | ORAL | 0 refills | Status: DC

## 2021-05-24 NOTE — Discharge Instructions (Signed)
Take the medication prescribed as directed.  Follow-up with your primary care provider for recheck on Monday.  Return emergency department for any new or worsening symptoms.

## 2021-05-24 NOTE — ED Triage Notes (Signed)
Pt arrives via [facility] with c/o lower abdominal pain x 1 month. Pt woke up at 1 am this morning and could not go back to sleep due to pain

## 2021-05-24 NOTE — ED Notes (Signed)
Patient ambulatory to bathroom.

## 2021-05-24 NOTE — ED Provider Notes (Signed)
[facility] EMERGENCY DEPARTMENT Provider Note   CSN: [id number] Arrival date & time: 05/24/21  0740     History  No chief complaint on file.   Kimberly [name] is a 88 y.o. female.  HPI     Kimberly [name] is a 88 y.o. female with past medical history of CKD, hypertension, atrial fibrillation, fibromyalgia, and CHF she has an internal pacemaker who presents to the Emergency Department complaining of diffuse lower abdominal pain x1 month.  She states the pain became worse at 1 AM this morning and woke her from sleep.  States the pain has been persistent since onset.  Pain is not attributed to food intake and pain is nonradiating.  Nothing makes her symptoms better or worse.  He denies any fever, chills, chest pain, shortness of breath urine or bowel changes.  States her stools have been nonbloody or black. Patient does live alone  Home Medications Prior to Admission medications   Medication Sig Start Date End Date Taking? Authorizing Provider  acetaminophen (TYLENOL) 325 MG tablet Take 325 mg by mouth as needed for headache.    [provider]  apixaban (ELIQUIS) 2.5 MG TABS tablet Take 1 tablet (2.5 mg total) by mouth 2 (two) times daily. Patient not taking: Reported on 03/26/2021 01/30/21   Lendon Colonel, NP  CALCIUM CARBONATE ANTACID PO Take 1 tablet by mouth daily as needed for heartburn or indigestion.    [provider]  carvedilol (COREG) 6.25 MG tablet TAKE 1 TABLET BY MOUTH  TWICE DAILY WITH A MEAL 11/07/20   Croitoru, Mihai, MD  furosemide (LASIX) 40 MG tablet Take 1.5 tablets (60 mg total) by mouth daily. 10/24/20   Croitoru, Mihai, MD  Multiple Vitamins-Minerals (PRESERVISION AREDS 2) CAPS Take 1 capsule by mouth 2 (two) times daily.     [provider]  pantoprazole (PROTONIX) 40 MG tablet Take 1 tablet (40 mg total) by mouth daily before breakfast. 07/26/20   Erenest Rasher, PA-C  Polyethylene Glycol 400 (BLINK TEARS) 0.25 % SOLN Place 1 drop  into both eyes daily as needed (for dry eye relief).    [provider]  potassium chloride SA (KLOR-CON M) 20 MEQ tablet TAKE 1 TABLET BY MOUTH  DAILY 05/02/21   Lendon Colonel, NP      Allergies    Ciprofloxacin, Flagyl [metronidazole], Papaya derivatives, Iodine, Penicillins, and Sulfa antibiotics    Review of Systems   Review of Systems  Constitutional:  Negative for activity change, appetite change and fever.  HENT:  Negative for trouble swallowing.   Gastrointestinal:  Positive for abdominal pain. Negative for blood in stool, constipation, diarrhea, nausea and vomiting.  Genitourinary:  Negative for dysuria and flank pain.  Neurological:  Negative for weakness and numbness.  All other systems reviewed and are negative.  Physical Exam Updated Vital Signs BP (!) 179/78    Pulse (!) 59    Temp 97.9 F (36.6 C) (Oral)    Resp (!) 21    Ht 5\' 7"  (1.702 m)    Wt 61.2 kg    SpO2 98%    BMI 21.13 kg/m  Physical Exam Vitals and nursing note reviewed.  Constitutional:      General: She is not in acute distress.    Appearance: Normal appearance. She is not ill-appearing or toxic-appearing.  HENT:     Head: Normocephalic.     Mouth/Throat:     Mouth: Mucous membranes are moist.  Neck:  Thyroid: No thyromegaly.     Meningeal: Kernig's sign absent.  Cardiovascular:     Rate and Rhythm: Normal rate and regular rhythm.     Pulses: Normal pulses.  Pulmonary:     Effort: Pulmonary effort is normal.     Breath sounds: Normal breath sounds. No wheezing.  Abdominal:     General: There is no distension.     Palpations: Abdomen is soft. There is no mass.     Tenderness: There is abdominal tenderness. There is no right CVA tenderness, left CVA tenderness, guarding or rebound.     Comments: Mild tenderness to palpation of the entire lower abdomen.  No abdominal distention.  No guarding or rebound tenderness.  No CVA tenderness, abdomen is soft.  Musculoskeletal:         General: Normal range of motion.     Cervical back: Normal range of motion.     Right lower leg: No edema.     Left lower leg: No edema.  Lymphadenopathy:     Cervical: No cervical adenopathy.  Skin:    General: Skin is warm.     Capillary Refill: Capillary refill takes less than 2 seconds.     Findings: No rash.  Neurological:     General: No focal deficit present.     Mental Status: She is alert and oriented to person, place, and time.    ED Results / Procedures / Treatments   Labs (all labs ordered are listed, but only abnormal results are displayed) Labs Reviewed  CBC WITH DIFFERENTIAL/PLATELET - Abnormal; Notable for the following components:      Result Value   RBC 3.38 (*)    Hemoglobin 9.4 (*)    HCT 31.0 (*)    RDW 18.0 (*)    All other components within normal limits  COMPREHENSIVE METABOLIC PANEL - Abnormal; Notable for the following components:   Potassium 3.3 (*)    Creatinine, Ser 1.08 (*)    Calcium 8.6 (*)    Total Protein 6.3 (*)    Albumin 3.4 (*)    GFR, Estimated 48 (*)    All other components within normal limits  URINALYSIS, ROUTINE W REFLEX MICROSCOPIC - Abnormal; Notable for the following components:   Color, Urine STRAW (*)    Ketones, ur 5 (*)    All other components within normal limits  LIPASE, BLOOD    EKG None  Radiology CT ABDOMEN PELVIS WO CONTRAST  Result Date: 05/24/2021 CLINICAL DATA:  BILATERAL lower quadrant pain for 1 month, woke up with pain this morning at 0100 hours and could not go back to sleep due to pain, history chronic kidney disease, hypertension, appendectomy, cholecystectomy, hernia repair EXAM: CT ABDOMEN AND PELVIS WITHOUT CONTRAST TECHNIQUE: Multidetector CT imaging of the abdomen and pelvis was performed following the standard protocol without IV contrast. RADIATION DOSE REDUCTION: This exam was performed according to the departmental dose-optimization program which includes automated exposure control, adjustment of  the mA and/or kV according to patient size and/or use of iterative reconstruction technique. COMPARISON:  01/17/2021 FINDINGS: Lower chest: Minimal bibasilar atelectasis. Enlargement of cardiac chambers with pacemaker leads at RIGHT atrium, RIGHT ventricle, and coronary sinus. Aortic and coronary arterial calcifications. Hepatobiliary: Gallbladder surgically absent. Hepatic cysts unchanged, largest 2.3 cm diameter. Pancreas: Atrophic pancreas without mass Spleen: Normal appearance Adrenals/Urinary Tract: Adrenal glands normal appearance. Tiny nonobstructing RIGHT renal calculi. No renal mass, hydronephrosis or hydroureter. Bladder and ureters unremarkable. Stomach/Bowel: Small hiatal hernia. Stomach incompletely distended with  suboptimal assessment of proximal wall thickness at the level of the hernia. Distal colonic diverticulosis without evidence of diverticulitis. Large and small bowel loops otherwise normal appearance. Vascular/Lymphatic: Atherosclerotic calcifications aorta, iliac arteries, visceral arteries, femoral arteries. Aorta normal caliber. No adenopathy. Reproductive: Uterus surgically absent.  Atrophic ovaries. Other: No free air or free fluid. Small supraumbilical hernia containing fat. Musculoskeletal: Osseous demineralization with degenerative disc and facet disease changes of lumbar spine, biconvex lumbar scoliosis, and chronic superior endplate compression deformity of T12. IMPRESSION: Tiny nonobstructing RIGHT renal calculi. Distal colonic diverticulosis without evidence of diverticulitis. Small hiatal hernia. Small supraumbilical hernia containing fat. No other acute intra-abdominal or intrapelvic abnormalities. Aortic Atherosclerosis (ICD10-I70.0). Electronically Signed   By: Lavonia Dana M.D.   On: 05/24/2021 13:43    Procedures Procedures    Medications Ordered in ED Medications  potassium chloride SA (KLOR-CON M) CR tablet 40 mEq (40 mEq Oral Given 05/24/21 1550)  dicyclomine  (BENTYL) capsule 10 mg (10 mg Oral Given 05/24/21 1819)    ED Course/ Medical Decision Making/ A&P                           Medical Decision Making Amount and/or Complexity of Data Reviewed Labs: ordered. Radiology: ordered.  Risk Prescription drug management.  This patient presents to the ED for concern of lower abdominal pain, this involves an extensive number of treatment options, and is a complaint that carries with it a high risk of complications and morbidity.  The differential diagnosis includes acute diverticulitis, bowel obstruction, neoplasm, constipation  Co morbidities that complicate the patient evaluation  Age, CKD, fibromyalgia, prior diverticulitis and multiple prior abdominal surgeries   Additional history obtained:  Additional history obtained from prior medical record   Lab Tests:  I Ordered, and personally interpreted labs.  The pertinent results include: No leukocytosis, electrolytes show mild hypokalemia with potassium of 3.3 serum creatinine mildly elevated, near baseline lipase unremarkable, urinalysis without evidence of infection   Imaging Studies ordered:  I ordered imaging studies including CT abdomen and pelvis I independently visualized and interpreted imaging which showed tiny nonobstructing right renal calculi, diverticulosis without evidence of diverticulitis I agree with the radiologist interpretation   Cardiac Monitoring:  The patient was maintained on a cardiac monitor.  I personally viewed and interpreted the cardiac monitored which showed an underlying rhythm of: Sinus rhythm   Medicines ordered and prescription drug management:  I ordered medication including Bentyl for abdominal pain.  Patient also given oral potassium here Reevaluation of the patient after these medicines showed that the patient improved  I have reviewed the patients home medicines and have made adjustments as needed    Reevaluation:  After the  interventions noted above, I reevaluated the patient and found that they have : Improved   Patient also seen by Dr. Sabra Heck and care plan discussed.  Dispostion:  After consideration of the diagnostic results and the patients response to treatment, reports some mild improvement after receiving p.o. Bentyl.  She is tolerated oral fluids and snacks in the department without complication.  Overall, patient's work-up today has been reassuring.  Low clinical suspicion for acute process.  I feel that patient is appropriate for discharge home at this time.  Will prescribe dicyclomine for her abdominal pain.  Referral information provided for gastroenterology.  Patient agreeable to plan.  Return precautions were discussed all questions were answered.         Final Clinical Impression(s) /  ED Diagnoses Final diagnoses:  Lower abdominal pain    Rx / DC Orders ED Discharge Orders     None         Kem Parkinson, PA-C 05/24/21 [place], MD 05/26/21 [phone]

## 2021-05-25 DIAGNOSIS — R6889 Other general symptoms and signs: Secondary | ICD-10-CM | POA: Diagnosis not present

## 2021-05-25 DIAGNOSIS — Z7401 Bed confinement status: Secondary | ICD-10-CM | POA: Diagnosis not present

## 2021-05-25 DIAGNOSIS — R279 Unspecified lack of coordination: Secondary | ICD-10-CM | POA: Diagnosis not present

## 2021-05-25 DIAGNOSIS — Z743 Need for continuous supervision: Secondary | ICD-10-CM | POA: Diagnosis not present

## 2021-05-25 DIAGNOSIS — R109 Unspecified abdominal pain: Secondary | ICD-10-CM | POA: Diagnosis not present

## 2021-05-25 NOTE — ED Notes (Signed)
Patient still awaiting EMS transport home.

## 2021-07-02 ENCOUNTER — Ambulatory Visit (INDEPENDENT_AMBULATORY_CARE_PROVIDER_SITE_OTHER): Payer: Medicare Other

## 2021-07-02 DIAGNOSIS — I442 Atrioventricular block, complete: Secondary | ICD-10-CM

## 2021-07-02 LAB — CUP PACEART REMOTE DEVICE CHECK
Battery Remaining Longevity: 75 mo
Battery Voltage: 3 V
Brady Statistic AP VP Percent: 0 %
Brady Statistic AP VS Percent: 0 %
Brady Statistic AS VP Percent: 99.46 %
Brady Statistic AS VS Percent: 0.54 %
Brady Statistic RA Percent Paced: 0 %
Brady Statistic RV Percent Paced: 99.46 %
Date Time Interrogation Session: 20230228020336
Implantable Lead Implant Date: 19971124
Implantable Lead Implant Date: 20070831
Implantable Lead Location: 753858
Implantable Lead Location: 753860
Implantable Lead Model: 4024
Implantable Lead Model: 4194
Implantable Pulse Generator Implant Date: 20220214
Lead Channel Impedance Value: 3363 Ohm
Lead Channel Impedance Value: 3363 Ohm
Lead Channel Impedance Value: 437 Ohm
Lead Channel Impedance Value: 437 Ohm
Lead Channel Impedance Value: 456 Ohm
Lead Channel Impedance Value: 475 Ohm
Lead Channel Impedance Value: 608 Ohm
Lead Channel Impedance Value: 627 Ohm
Lead Channel Impedance Value: 836 Ohm
Lead Channel Pacing Threshold Amplitude: 0.875 V
Lead Channel Pacing Threshold Amplitude: 2.125 V
Lead Channel Pacing Threshold Pulse Width: 0.4 ms
Lead Channel Pacing Threshold Pulse Width: 1 ms
Lead Channel Sensing Intrinsic Amplitude: 0.25 mV
Lead Channel Sensing Intrinsic Amplitude: 0.25 mV
Lead Channel Setting Pacing Amplitude: 2.5 V
Lead Channel Setting Pacing Amplitude: 3.25 V
Lead Channel Setting Pacing Pulse Width: 0.4 ms
Lead Channel Setting Pacing Pulse Width: 1 ms
Lead Channel Setting Sensing Sensitivity: 0.9 mV

## 2021-07-09 NOTE — Progress Notes (Signed)
Remote pacemaker transmission.   

## 2021-07-19 DIAGNOSIS — G4700 Insomnia, unspecified: Secondary | ICD-10-CM | POA: Diagnosis not present

## 2021-07-19 DIAGNOSIS — I4891 Unspecified atrial fibrillation: Secondary | ICD-10-CM | POA: Diagnosis not present

## 2021-07-31 DIAGNOSIS — Z743 Need for continuous supervision: Secondary | ICD-10-CM | POA: Diagnosis not present

## 2021-07-31 DIAGNOSIS — J209 Acute bronchitis, unspecified: Secondary | ICD-10-CM | POA: Diagnosis not present

## 2021-07-31 DIAGNOSIS — Z91041 Radiographic dye allergy status: Secondary | ICD-10-CM | POA: Diagnosis not present

## 2021-07-31 DIAGNOSIS — I10 Essential (primary) hypertension: Secondary | ICD-10-CM | POA: Diagnosis not present

## 2021-07-31 DIAGNOSIS — Z882 Allergy status to sulfonamides status: Secondary | ICD-10-CM | POA: Diagnosis not present

## 2021-07-31 DIAGNOSIS — Z880 Allergy status to penicillin: Secondary | ICD-10-CM | POA: Diagnosis not present

## 2021-07-31 DIAGNOSIS — J40 Bronchitis, not specified as acute or chronic: Secondary | ICD-10-CM | POA: Diagnosis not present

## 2021-07-31 DIAGNOSIS — Z602 Problems related to living alone: Secondary | ICD-10-CM | POA: Diagnosis not present

## 2021-07-31 DIAGNOSIS — R6889 Other general symptoms and signs: Secondary | ICD-10-CM | POA: Diagnosis not present

## 2021-07-31 DIAGNOSIS — R0602 Shortness of breath: Secondary | ICD-10-CM | POA: Diagnosis not present

## 2021-07-31 DIAGNOSIS — Z79899 Other long term (current) drug therapy: Secondary | ICD-10-CM | POA: Diagnosis not present

## 2021-08-16 ENCOUNTER — Telehealth: Payer: Self-pay | Admitting: Cardiovascular Disease

## 2021-08-16 NOTE — Telephone Encounter (Signed)
Left message to call back  

## 2021-08-16 NOTE — Telephone Encounter (Signed)
New Message: ? ? ?Patient says she have Sinus. She wants to know if she can use the spray Vick Sinex ? ? ? ? ? ?

## 2021-08-16 NOTE — Telephone Encounter (Signed)
Ok to use for no more than 3 days due to potential for rebound congestion ?

## 2021-08-16 NOTE — Telephone Encounter (Signed)
Pt updated and verbalized understanding.  

## 2021-08-19 ENCOUNTER — Telehealth: Payer: Self-pay | Admitting: Cardiovascular Disease

## 2021-08-19 NOTE — Telephone Encounter (Signed)
Yes, she is fine to take extra strength tylenol.  Please limit to no more than 6 tablets per day ?

## 2021-08-19 NOTE — Telephone Encounter (Signed)
Patient would like to know if it's okay for her to take extra strength tylenol.  ?

## 2021-08-19 NOTE — Telephone Encounter (Signed)
Called patient, gave advise from [facility].  ?Patient verbalized understanding. ? ?

## 2021-08-26 ENCOUNTER — Telehealth: Payer: Self-pay | Admitting: Cardiovascular Disease

## 2021-08-26 NOTE — Telephone Encounter (Signed)
Pt c/o medication issue: ? ?1. Name of Medication: Tylenol ? ?2. How are you currently taking this medication (dosage and times per day)? Patient not taking ? ?3. Are you having a reaction (difficulty breathing--STAT)? no ? ?4. What is your medication issue? Patient has been having headaches. She wanted to know if it is OK for her to take Tylenol ?

## 2021-08-26 NOTE — Telephone Encounter (Signed)
Spoke with patient who wanted to know if she could take Tylenol for her headache. I gave her the 08/19/21 response from PharmD... "Yes, she is fine to take extra strength tylenol.  Please limit to no more than 6 tablets per day." Patient said ok and thanked me for calling her. ?

## 2021-08-29 ENCOUNTER — Telehealth: Payer: Self-pay | Admitting: Cardiovascular Disease

## 2021-08-29 NOTE — Telephone Encounter (Signed)
Patient called wanting to know if she can use suppositories for hemorrhoids. I explained how stimulating that area can decrease the heart rate and suggested hemorrhoidal cream. I recommended that she not use suppositories until she hears back from [place]. ?

## 2021-08-29 NOTE — Telephone Encounter (Signed)
Patient is calling wanting to know if it is okay for her to use hemorrhoidal suppositories. States it advises to check with your doctor before use if you have a heart condition, high BP, etc. Please advise.  ?

## 2021-08-29 NOTE — Telephone Encounter (Signed)
Patient informed of the note from Dr. Sallyanne Kuster.Kimberly KitchenMarland [name]"She has a pacemaker, paces 100% of the time.  No need to worry about slow heart rates." ?She was thankful for the call. ?

## 2021-08-29 NOTE — Telephone Encounter (Signed)
She has a pacemaker, paces 100% of the time.  No need to worry about slow heart rates. ?

## 2021-09-03 ENCOUNTER — Telehealth: Payer: Self-pay | Admitting: Cardiovascular Disease

## 2021-09-03 NOTE — Telephone Encounter (Signed)
New Message: ? ? ? ? ?Patient says she have mole in her house and it is making her sick. She wants to know what can she take? ?

## 2021-09-03 NOTE — Telephone Encounter (Signed)
Returned cal to pt she states that she has mold in her house, and she is feeling sick to her stomach. She denies any SOB, Chest pain or pressure, lightheadedness, palpitations, etc. She will call her PCP. ?

## 2021-09-04 ENCOUNTER — Telehealth: Payer: Self-pay | Admitting: Home Health

## 2021-09-04 NOTE — Telephone Encounter (Signed)
Patient called after hours line and had questions about if she can take Immodium AD for her diarrhea as she has a pacemaker. Called the patient and spoke to her directly, she report several days onset of diarrhea, no abdominal pain, fever, chills. Advised the patient to call her PCP discuss her diarrhea symptoms further before take OTC meds, as she may require diarrhea workup and treatment. She is agreeable with the plan.  ?

## 2021-09-06 NOTE — Telephone Encounter (Signed)
Pt c/o medication issue: ? ?1. Name of Medication:  ?Immodium AD ? ?2. How are you currently taking this medication (dosage and times per day)?  ? ?3. Are you having a reaction (difficulty breathing--STAT)?  ? ?4. What is your medication issue?  ? ?Patient is following up regarding Immodium AD inquiry. Again, would like to know if it would be alright to take this medication for diarrhea with PPM. Please advise as able.  ?

## 2021-09-06 NOTE — Telephone Encounter (Signed)
Imodium AD fine to take with her meds,  patient aware.  ?

## 2021-09-12 ENCOUNTER — Telehealth: Payer: Self-pay | Admitting: Cardiology

## 2021-09-12 ENCOUNTER — Other Ambulatory Visit: Payer: Self-pay | Admitting: Cardiovascular Disease

## 2021-09-12 NOTE — Telephone Encounter (Signed)
Pt called concerned the mold in her house could affect her pacemaker.  Reassured it would not.  She thanked me.  ?

## 2021-09-13 ENCOUNTER — Telehealth: Payer: Self-pay | Admitting: Physician Assistant

## 2021-09-13 NOTE — Telephone Encounter (Signed)
Patient called after hours stating that she has mold in her house.  Asking if that would affect her pacemaker.  Reassurance given.  Noted similar phone call yesterday as well.  I have recommended to work on remediating mold or temporary seek other housing arrangement. Reviewed alarming symptoms. ?

## 2021-09-14 ENCOUNTER — Telehealth: Payer: Self-pay | Admitting: Student

## 2021-09-14 NOTE — Telephone Encounter (Addendum)
? ?  The patient called the after-hours line earlier today as she is concerned about mold in her home. By review of notes, she has called the office multiple times about the issue this past week. I told her that unfortunately there is nothing we can do from a cardiac perspective for this. She was evaluated at [facility] overnight and reports things "checked out" but she was displeased as they did not admit her. She is trying to arrange for mold remediation but says this will not be until Monday. I encouraged her to possibly seek other housing arrangements including with family, friends or neighbors but she says this is not an option. Her emergency contact in her chart is her Education officer, museum and I encouraged her to reach out to her to see if any other options were available. She does plan to do so later today. In the interim, I encouraged her to open the windows in her home and possibly sit outside to have exposure to fresh air as she reports the smell is her biggest issue at this time.  ? ?Signed, ?Erma Heritage, PA-C ?09/14/2021, 2:57 PM ? ?

## 2021-09-16 ENCOUNTER — Telehealth: Payer: Self-pay | Admitting: Cardiovascular Disease

## 2021-09-16 NOTE — Telephone Encounter (Signed)
Patient reported that this morning, while putting on socks and shoes, she lost her balance and scraped the outer part of her left leg on a box. The scrape is 6 inches long. EMS came to her house to stop the bleeding and apply a bandage. The bandage is dry and intact. She wanted to know how to treat the scrape. I told her to leave the bandage on until tomorrow  and use a light layer the triple abx cream she has at her house (has no allergic reaction to it). She will let it air dry during the day and cover the wound when sleeping. She has no other signs of bleeding. She has mold in her house and is waiting for a spot at assisted living. ?

## 2021-09-16 NOTE — Telephone Encounter (Signed)
Thanks for the update

## 2021-09-16 NOTE — Telephone Encounter (Signed)
See previous 09/16/21 telephone note. ?

## 2021-09-16 NOTE — Telephone Encounter (Signed)
Spoke to patient she stated she has mold in her home.She is working with someone to help get rid of mold.She wanted to make sure mold will not effect pacemaker.Advised pacemaker should be ok.I will make Dr.Croitoru aware. ?

## 2021-09-16 NOTE — Telephone Encounter (Signed)
Pt states that she skinned her leg and it was hard to get the bleeding to stop. She finally has it stopped but would like for the nurse to give her a call. Please advise ?

## 2021-09-16 NOTE — Telephone Encounter (Signed)
Called patient left message on personal voice mail to call back. 

## 2021-09-16 NOTE — Telephone Encounter (Signed)
Patient is calling stating she has mold in her home and is wanting to know if this could affect her pacemaker.  ?

## 2021-09-16 NOTE — Telephone Encounter (Signed)
Patient is calling bout mole being in her home, wanting to know how would this effect her And is there anything she can take until she can move. Please advise  ?

## 2021-09-18 ENCOUNTER — Encounter (HOSPITAL_COMMUNITY): Payer: Self-pay | Admitting: Emergency Medicine

## 2021-09-18 ENCOUNTER — Emergency Department (HOSPITAL_COMMUNITY): Payer: Medicare Other

## 2021-09-18 ENCOUNTER — Emergency Department (HOSPITAL_COMMUNITY)
Admission: EM | Admit: 2021-09-18 | Discharge: 2021-09-18 | Disposition: A | Discharge status: Home / Self Care | Payer: Medicare Other | Attending: Emergency Medicine | Admitting: Emergency Medicine

## 2021-09-18 ENCOUNTER — Other Ambulatory Visit: Payer: Self-pay

## 2021-09-18 DIAGNOSIS — R519 Headache, unspecified: Secondary | ICD-10-CM | POA: Insufficient documentation

## 2021-09-18 DIAGNOSIS — Z950 Presence of cardiac pacemaker: Secondary | ICD-10-CM | POA: Diagnosis not present

## 2021-09-18 DIAGNOSIS — I130 Hypertensive heart and chronic kidney disease with heart failure and stage 1 through stage 4 chronic kidney disease, or unspecified chronic kidney disease: Secondary | ICD-10-CM | POA: Diagnosis not present

## 2021-09-18 DIAGNOSIS — I5043 Acute on chronic combined systolic (congestive) and diastolic (congestive) heart failure: Secondary | ICD-10-CM | POA: Diagnosis not present

## 2021-09-18 DIAGNOSIS — Z955 Presence of coronary angioplasty implant and graft: Secondary | ICD-10-CM | POA: Insufficient documentation

## 2021-09-18 DIAGNOSIS — N183 Chronic kidney disease, stage 3 unspecified: Secondary | ICD-10-CM | POA: Diagnosis not present

## 2021-09-18 DIAGNOSIS — Z87891 Personal history of nicotine dependence: Secondary | ICD-10-CM | POA: Diagnosis not present

## 2021-09-18 MED ORDER — ACETAMINOPHEN 500 MG PO TABS
1000.0000 mg | ORAL_TABLET | Freq: Once | ORAL | Status: AC
  Administered 2021-09-18: 1000 mg via ORAL
  Filled 2021-09-18: qty 2

## 2021-09-18 NOTE — ED Notes (Addendum)
TOC consulted for transportation needs and HH needs. CSW reached out to [place] with [facility] and [place] with [facility], both are unable to accept referral. CSW reached out to Sarah with [facility], she will update CSW if they can accept. CSW requested [facility] orders.  ? ?CSW updated RN that [facility] can be called for transport. TOC signing off.  ? ?Judson Roch with [facility] was unable to accept pt for [facility] services. At this time there are no [facility] companies that can accept pts referral.  ?

## 2021-09-18 NOTE — Discharge Instructions (Signed)
You were evaluated in the Emergency Department and after careful evaluation, we did not find any emergent condition requiring admission or further testing in the hospital.  Your exam/testing today is overall reassuring.  Please return to the Emergency Department if you experience any worsening of your condition.   Thank you for allowing us to be a part of your care. 

## 2021-09-18 NOTE — ED Triage Notes (Signed)
Pt c/o headache due to mold in house. Per ems she calls them about 3 times a day for different complaints.  ?

## 2021-09-18 NOTE — ED Provider Notes (Signed)
?[place] DEPT ?[facility] Emergency Department ?Provider Note ?MRN:  [id number]  ?Arrival date & time: 09/18/21    ? ?Chief Complaint   ?Headache ?  ?History of Present Illness   ?Kimberly [name] is a 88 y.o. year-old female with a history of bipolar disorder, CKD, fibromyalgia presenting to the ED with chief complaint of headache. ? ?Headache, frontal, described as moderate to severe.  She attributes it to the black mold in her home.  No other complaints at this time.  Gets headaches often. ? ?Review of Systems  ?A thorough review of systems was obtained and all systems are negative except as noted in the HPI and PMH.  ? ?Patient's Health History   ? ?Past Medical History:  ?Diagnosis Date  ? Acute blood loss anemia 10/11/2012  ? Acute diverticulitis 08/24/2013  ? Acute on chronic combined systolic and diastolic CHF, NYHA class 4 ([place]) 11/15/2013  ? Antral ulcer 10/11/2012  ? Arrhythmia   ? atrial fibb  ? Atrial fibrillation ([place])   ? Bipolar affective disorder ([place]) 03/23/2017  ? Cardiomyopathy, nonischemic ([place])   ? Chronic anticoagulation 10/12/2012  ? CKD (chronic kidney disease) stage 3, GFR 30-59 ml/min (HCC) 10/12/2012  ? Depression   ? Erosive esophagitis 10/11/2012  ? Fibromyalgia   ? Glaucoma   ? H/O echocardiogram 2007  ? EF 40-45%,         ? Hypertension   ? Osteoarthritis   ? Pacemaker   ? Last saw cards 07/2013  ? Scoliosis   ?  ?Past Surgical History:  ?Procedure Laterality Date  ? ABDOMINAL HYSTERECTOMY    ? APPENDECTOMY    ? BACK SURGERY    ? BIOPSY  03/03/2017  ? Procedure: BIOPSY;  Surgeon: Danie Binder, MD;  [facility];  Service: Endoscopy;;  gastric  ? BREAST SURGERY    ? CARDIAC CATHETERIZATION  12/08/2005  ? LAD AND LEFT MAIN WITH NO HIGH-GRADE STENOSIS. MILD DISEASE IN THE CX AND LAD SYSTEM. SEVERE LV DYSFUNCTION WITH DILATION OF THE LV. EF 15-20%. LV END-DIASTOLIC PRESSURE IS 90. +1 MR.  ? CHOLECYSTECTOMY    ? COLONOSCOPY N/A 03/03/2017  ? Dr. Oneida Alar: 5 colon polyps removed,  adenomatous.  Diverticulosis.  99% of the colon was cleared but the cecum was not adequately seen.  ? CYSTOSCOPY N/A 02/24/2013  ? Procedure: CYSTOSCOPY WITH URETHRAL DILITATION;  Surgeon: Marissa Nestle, MD;  Location: [facility] ORS;  Service: Urology;  Laterality: N/A;  ? DOPPLER ECHOCARDIOGRAPHY N/A 05/30/2010  ? LV SIZE IS NORMAL. LV SYSTOLIC FUNCTION IS LOW NORMAL. EF=50-55%. MILD INFERIOR HYPOKINESIS.MILD TO MODERATE POSTERIOR WALL HYPOKINESIS.PACEMAKER LEAD IN THE RV. LA IS MILDLY DILATED. RA IS MODERATE TO SEVERLY DILATED. PACEMAKER LEAD IN THE RA. MILD CALCICICATION OF THE MV APPARATUS. MODERATE MR. MILD TO MODERATE TR. MILD PHTN.AV MILDLY SCLEROTIC.  ? ESOPHAGOGASTRODUODENOSCOPY N/A 10/13/2012  ? Dr. Gala Romney: severe ulcerative reflux esophagitis, question of Barrett's but negative path, single deep prepyloric antral ulcer, negative H.pylori  ? ESOPHAGOGASTRODUODENOSCOPY N/A 03/03/2017  ? Dr. Oneida Alar: Gastritis/duodenitis, no H. pylori  ? HERNIA REPAIR    ? right inguinal hernia and umbilical  ? LOWETR EXT VENOUS Bilateral 11-08-10  ? R & L- NO EVIDENCE OF THROMBUS OR THROMBOPHLEBITIS. THERE IS MILD AMOUNT OF SUBCUTANEOUS EDEMA NOTED WITHIN THE LEFT CALF AND ANKLE. R & L GSV AND SSV- NO VENOUS INSUFF NOTED.  ? NECK SURGERY    ? NUCLEAR STRESS TEST N/A 02/13/2009  ? NORMAL PATTERN OF PERFUSION IN  ALL REGIONS. POST STRESS VENTICULAR SIZE IS NORMAL. POST  STESS EF 85%.  NORMAL MYOCARDIAL PERFUSION STUDY.  ? OPEN REDUCTION INTERNAL FIXATION (ORIF) DISTAL PHALANX Left 11/16/2018  ? Procedure: MIDDLE FINGER OPEN REDUCTION VERSUS RECONSTRUCTION;  Surgeon: Roseanne Kaufman, MD;  Location: [place];  Service: Orthopedics;  Laterality: Left;  ? PACEMAKER INSERTION    ? POLYPECTOMY  03/03/2017  ? Procedure: POLYPECTOMY;  Surgeon: Danie Binder, MD;  [facility];  Service: Endoscopy;;  colon  ? PPM GENERATOR CHANGEOUT N/A 06/18/2020  ? Procedure: PPM GENERATOR CHANGEOUT;  Surgeon: Sanda Klein, MD;  Location: [place] CV LAB;  Service: Cardiovascular;  Laterality: N/A;  ? TONSILLECTOMY    ? YAG LASER APPLICATION Bilateral 12/12/9831  ? Procedure: YAG LASER APPLICATION;  Surgeon: Williams Che, MD;  Location: [facility];  Service: Ophthalmology;  Laterality: Bilateral;  ?  ?Family History  ?Problem Relation Age of Onset  ? Cancer Sister   ? Asthma Sister   ? Heart failure Brother   ? Pulmonary embolism Brother   ? Cancer Brother   ? Colon cancer Neg Hx   ?  ?Social History  ? ?Socioeconomic History  ? Marital status: Widowed  ?  Spouse name: Not on file  ? Number of children: Not on file  ? Years of education: Not on file  ? Highest education level: Some college, no degree  ?Occupational History  ? Occupation: Primary school teacher  ?  Employer: RETIRED  ?  Comment: retired  ?Tobacco Use  ? Smoking status: Former  ?  Packs/day: 0.50  ?  Years: 30.00  ?  Pack years: 15.00  ?  Types: Cigarettes  ?  Quit date: 12/13/2004  ?  Years since quitting: 16.7  ? Smokeless tobacco: Never  ? Tobacco comments:  ?  former smoker  ?Vaping Use  ? Vaping Use: Never used  ?Substance and Sexual Activity  ? Alcohol use: Yes  ?  Alcohol/week: 0.0 standard drinks  ?  Comment: history of drinking a 1/2 glass of wine in the evening  ? Drug use: No  ? Sexual activity: Never  ?Other Topics Concern  ? Not on file  ?Social History Narrative  ? No home exercise program. PT ordered this week.  ? ?Social Determinants of Health  ? ?Financial Resource Strain: Not on file  ?Food Insecurity: Not on file  ?Transportation Needs: Not on file  ?Physical Activity: Not on file  ?Stress: Not on file  ?Social Connections: Not on file  ?Intimate Partner Violence: Not on file  ?  ? ?Physical Exam  ? ?Vitals:  ? 09/18/21 0430 09/18/21 0500  ?BP: (!) 145/67 106/84  ?Pulse: 67 61  ?Resp: 15 19  ?Temp:    ?SpO2: 100% 100%  ?  ?CONSTITUTIONAL: Chronically ill-appearing, NAD ?NEURO/PSYCH:  Alert and oriented x 3, normal and symmetric strength and sensation, normal coordination,  normal speech ?EYES:  eyes equal and reactive ?ENT/NECK:  no LAD, no JVD ?CARDIO: Regular rate, well-perfused, normal S1 and S2 ?PULM:  CTAB no wheezing or rhonchi ?GI/GU:  non-distended, non-tender ?MSK/SPINE:  No gross deformities, no edema ?SKIN:  no rash, atraumatic ? ? ?*Additional and/or pertinent findings included in MDM below ? ?Diagnostic and Interventional Summary  ? ? EKG Interpretation ? ?Date/Time:    ?Ventricular Rate:    ?PR Interval:    ?QRS Duration:   ?QT Interval:    ?QTC Calculation:   ?R Axis:     ?Text Interpretation:   ?  ? ?  ? ?  Labs Reviewed - No data to display  ?CT HEAD WO CONTRAST (5MM)  ?Final Result  ?  ?  ?Medications  ?acetaminophen (TYLENOL) tablet 1,000 mg (1,000 mg Oral Given 09/18/21 0413)  ?  ? ?Procedures  /  Critical Care ?Procedures ? ?ED Course and Medical Decision Making  ?Initial Impression and Ddx ?Frontal headache, suspect tension type, long history of headaches.  Reassuring neurological exam, obtaining screening CT head given advanced age to exclude signs of subdural hematoma, patient is anticoagulated. ? ?Past medical/surgical history that increases complexity of ED encounter: History of A-fib on Eliquis ? ?Interpretation of Diagnostics ?I personally reviewed the CT head and my interpretation is as follows: No obvious abnormalities or bleeding ?   ? ? ?Patient Reassessment and Ultimate Disposition/Management ?Patient feeling better on reassessment after Tylenol, appropriate for discharge. ? ?Patient management required discussion with the following services or consulting groups:  None ? ?Complexity of Problems Addressed ?Acute illness or injury that poses threat of life of bodily function ? ?Additional Data Reviewed and Analyzed ?Further history obtained from: ?None ? ?Additional Factors Impacting ED Encounter Risk ?None ? ?Barth Kirks. Sedonia Small, MD ?[facility] Emergency Medicine ?[email or web address] [email or web address] ? ?Final Clinical Impressions(s) / ED Diagnoses   ? ?  ICD-10-CM   ?1. Acute nonintractable headache, unspecified headache type  R51.9   ?  ?  ?ED Discharge Orders   ? ? None  ? ?  ?  ? ?Discharge Instructions Discussed with and Provided to Patient:  ? ?

## 2021-09-18 NOTE — ED Notes (Signed)
Assisted with eating. Heated food and coffee up per request. ?

## 2021-09-18 NOTE — ED Notes (Addendum)
Pt up to bedside commode

## 2021-09-18 NOTE — ED Notes (Signed)
[place] called for ride and will be here in about 1 hour. ?

## 2021-09-19 ENCOUNTER — Encounter (HOSPITAL_COMMUNITY): Payer: Self-pay

## 2021-09-19 ENCOUNTER — Emergency Department (HOSPITAL_COMMUNITY): Payer: Medicare Other

## 2021-09-19 ENCOUNTER — Telehealth: Payer: Self-pay | Admitting: Cardiovascular Disease

## 2021-09-19 ENCOUNTER — Other Ambulatory Visit: Payer: Self-pay

## 2021-09-19 ENCOUNTER — Emergency Department (HOSPITAL_COMMUNITY)
Admission: EM | Admit: 2021-09-19 | Discharge: 2021-09-19 | Disposition: A | Discharge status: Home / Self Care | Payer: Medicare Other | Attending: Emergency Medicine | Admitting: Emergency Medicine

## 2021-09-19 DIAGNOSIS — Z77120 Contact with and (suspected) exposure to mold (toxic): Secondary | ICD-10-CM | POA: Diagnosis not present

## 2021-09-19 DIAGNOSIS — R519 Headache, unspecified: Secondary | ICD-10-CM | POA: Insufficient documentation

## 2021-09-19 DIAGNOSIS — Z7902 Long term (current) use of antithrombotics/antiplatelets: Secondary | ICD-10-CM | POA: Insufficient documentation

## 2021-09-19 NOTE — Discharge Instructions (Signed)
Tylenol for headaches.  Follow up with your primary care Physician for recheck.

## 2021-09-19 NOTE — Telephone Encounter (Signed)
Returned call to patient Dr.Croitoru advised to call PCP.

## 2021-09-19 NOTE — Telephone Encounter (Signed)
Spoke to patient she stated she is unable to reach her social worker [employer].She requested I call her.Called Selinda Eon Bullins at [phone] no answer.Unable to leave a message voice mail full.Advised I will send a message to our social worker for help.

## 2021-09-19 NOTE — Telephone Encounter (Signed)
Patient states she has mold in her house and wants to know if it could effect her heart and pacemaker.

## 2021-09-19 NOTE — Telephone Encounter (Signed)
Pt states that she would like nurse to give her a call back. Please advise

## 2021-09-19 NOTE — Telephone Encounter (Signed)
Spoke to patient she stated she wanted Dr.Croitoru to call her home a leave a message on answering machine not safe for her to live in her home with mold.Stated social services is coming out to her home this afternoon.Stated she has a company working to get mold out but they are dragging their feet.Stated social services will place her in a hotel until mold removal is done if Dr.Croitoru advises them. Advised Dr.Croitoru is in clinic today.I will send message to him.

## 2021-09-19 NOTE — ED Provider Notes (Signed)
[facility] EMERGENCY DEPARTMENT Provider Note   CSN: [id number] Arrival date & time: 09/19/21  1727     History  Chief Complaint  Patient presents with   Headache    Kimberly [name] is a 88 y.o. female.  Patient reports that she has mold in her house and that it is making her short of breath.  Patient reports that she has mold on all her clothing and on her furniture.  Patient reports that she has nowhere to go to get away from the mold so she came to the emergency department tonight.  Patient is concerned that she is breathing mold down into her lungs.  Patient reports that the mold causes her to have headaches.  Patient states she does not have any family friends or neighbors that she can stay with.  Patient reports she has headaches every day she reports she feels short of breath every day.  Patient reports she has a Education officer, museum who helps her but she has not been able to find her any place to stay.  The history is provided by the patient. No language interpreter was used.  Headache     Home Medications Prior to Admission medications   Medication Sig Start Date End Date Taking? Authorizing Provider  acetaminophen (TYLENOL) 325 MG tablet Take 325 mg by mouth every 4 (four) hours as needed for headache.    [provider]  apixaban (ELIQUIS) 2.5 MG TABS tablet Take 1 tablet (2.5 mg total) by mouth 2 (two) times daily. 01/30/21   Lendon Colonel, NP  carvedilol (COREG) 6.25 MG tablet Take 1 tablet (6.25 mg total) by mouth 2 (two) times daily with a meal. Schedule an appointment for further refills, 1st attempt 09/16/21   Croitoru, Dani Gobble, MD  dicyclomine (BENTYL) 10 MG capsule Take 1 capsule (10 mg total) by mouth 3 (three) times daily before meals. 05/24/21   Triplett, Tammy, PA-C  furosemide (LASIX) 40 MG tablet Take 1.5 tablets (60 mg total) by mouth daily. 10/24/20   Croitoru, Mihai, MD  Multiple Vitamins-Minerals (PRESERVISION AREDS 2) CAPS Take 1 capsule by mouth 2 (two)  times daily.     [provider]  pantoprazole (PROTONIX) 40 MG tablet Take 1 tablet (40 mg total) by mouth daily before breakfast. Patient not taking: Reported on 05/24/2021 07/26/20   Erenest Rasher, PA-C  Polyethyl Glycol-Propyl Glycol (SYSTANE) 0.4-0.3 % SOLN Apply 1 drop to eye daily as needed (dry eyes).    [provider]  Polyethylene Glycol 400 (BLINK TEARS) 0.25 % SOLN Apply 1 drop to eye daily.    [provider]  potassium chloride SA (KLOR-CON M) 20 MEQ tablet TAKE 1 TABLET BY MOUTH  DAILY Patient not taking: Reported on 05/24/2021 05/02/21   Lendon Colonel, NP      Allergies    Ciprofloxacin, Flagyl [metronidazole], Papaya derivatives, Iodine, Penicillins, and Sulfa antibiotics    Review of Systems   Review of Systems  Respiratory:  Positive for shortness of breath.   Neurological:  Positive for headaches.  All other systems reviewed and are negative.  Physical Exam Updated Vital Signs BP (!) 153/72   Pulse 63   Temp (!) 97.5 F (36.4 C) (Oral)   Resp 15   Ht '5\' 7"'$  (1.702 m)   Wt 62 kg   SpO2 100%   BMI 21.41 kg/m  Physical Exam Vitals and nursing note reviewed.  Constitutional:      Appearance: She is well-developed.  HENT:  Head: Normocephalic.  Cardiovascular:     Rate and Rhythm: Normal rate.  Pulmonary:     Effort: Pulmonary effort is normal.  Abdominal:     General: There is no distension.  Musculoskeletal:        General: Normal range of motion.     Cervical back: Normal range of motion.  Skin:    General: Skin is warm.  Neurological:     Mental Status: She is alert and oriented to person, place, and time.    ED Results / Procedures / Treatments   Labs (all labs ordered are listed, but only abnormal results are displayed) Labs Reviewed - No data to display  EKG None  Radiology CT HEAD WO CONTRAST (5MM)  Result Date: 09/18/2021 CLINICAL DATA:  Headache, new or worsening (Age >= 50y) EXAM: CT HEAD  WITHOUT CONTRAST TECHNIQUE: Contiguous axial images were obtained from the base of the skull through the vertex without intravenous contrast. RADIATION DOSE REDUCTION: This exam was performed according to the departmental dose-optimization program which includes automated exposure control, adjustment of the mA and/or kV according to patient size and/or use of iterative reconstruction technique. COMPARISON:  09/11/2021 and previous FINDINGS: Brain: Diffuse parenchymal atrophy. Patchy areas of hypoattenuation in deep and periventricular white matter bilaterally. Negative for acute intracranial hemorrhage, mass lesion, acute infarction, midline shift, or mass-effect. Acute infarct may be inapparent on noncontrast CT. Ventricles and sulci symmetric. Vascular: Atherosclerotic and physiologic intracranial calcifications. Skull: Negative for fracture. Sinuses/Orbits: No acute finding. Other: None IMPRESSION: 1. Negative for bleed or other acute intracranial process. 2. Atrophy and nonspecific white matter changes. Electronically Signed   By: Lucrezia Europe M.D.   On: 09/18/2021 05:19    Procedures Procedures    Medications Ordered in ED Medications - No data to display  ED Course/ Medical Decision Making/ A&P                           Medical Decision Making Patient complains of being exposed to mold in her house and it causing headaches and shortness of breath  Amount and/or Complexity of Data Reviewed External Data Reviewed: notes.    Details: Cardiology notes reviewed.  Patient has a pacemaker she is followed by [facility] cardiology.  Patient has had multiple recent emergency department notes these are reviewed patient had a CT scan of her head yesterday.  The CT of her head was normal Radiology: ordered and independent interpretation performed. Decision-making details documented in ED Course.    Details: Chest xray  no acute abnormality  Risk Risk Details: Patient has an oxygen  saturation of 100%, she is afebrile respiratory rate is normal, patient has a normal chest x-ray and had a head CT scan yesterday.  Patient's primary concern is finding somewhere to stay patient is stable she does not appear to have any medical condition that would warrant admission or facility placement.  She Is stable for discharge at this time           Final Clinical Impression(s) / ED Diagnoses Final diagnoses:  Nonintractable headache, unspecified chronicity pattern, unspecified headache type  Mold exposure    Rx / DC Orders ED Discharge Orders     None      An After Visit Summary was printed and given to the patient.    Fransico Meadow, [place] 09/19/21 2254    Sherwood Gambler, MD 09/20/21 [phone]

## 2021-09-19 NOTE — Telephone Encounter (Signed)
[name] was able to reach Michelle Bullins, SW w/ [place] [employer]. I introduced self, role, reason for call. Michelle states that she is not the guardian for the pt and she is competent. She assists with organizing transportation, shopping, and getting pt basic needs met. Pt does have a daughter but she is not present (travels extensively) and does not manage pt care needs. She shares that pt has mold and has a company that is going to treat it but the pt had cancelled the check that they were going to use to begin work, which has delayed remediation. She shares the letter pt is requesting is for her insurance company which may assist with a hotel while work is done. Michelle is not able to call and request this letter, if pt is able to request it she can pick it up from providers office. [name] shared that our office has requested this come from PCP office, I will call and advise pt of the above and our request.  [name] called Ms. Blando. Introduced self, role, reason for call. Pt very anxious sounding on the phone. She shares she thinks shes going to call and ambulance. I shared that Dr. C has requested PCP office complete this letter. I also let pt know that Michelle can collect that letter should pt call and office be willing to write it for her. Pt requests I call on pt behalf for this letter. I shared that pt would need to call for this as I do not know what the mold looks like, the extent of the damage or if there are any Pt took down Dr. Burdine's office number.    , MSW, [name] Clinical Social Worker II [facility]  [phone]- work cell phone (preferred) [phone]- desk phone  

## 2021-09-19 NOTE — ED Triage Notes (Addendum)
Reports h/a due to mold in home.  Vss by ems.  Reports that she needed to get away from the smell at home per ems.  Pt is alert and oriented.  Able to walk with walker.  Pt lives alone.   States that she wants to go to a nursing home to get away from the mold.

## 2021-09-19 NOTE — Telephone Encounter (Signed)
Defer to PCP

## 2021-09-20 ENCOUNTER — Telehealth: Payer: Self-pay | Admitting: Cardiovascular Disease

## 2021-09-20 ENCOUNTER — Telehealth: Payer: Self-pay | Admitting: Licensed Clinical Social Worker

## 2021-09-20 NOTE — Telephone Encounter (Signed)
[name] received multiple calls from pt. When I returned the call pt states she was calling me b/c I had called her. I reminded pt of our conversation yesterday. Pt shares she just came home from hospital. Continues to share about mold in the home and that she has her Education officer, museum through [place] doing remediation and PCP has been contacted about it. No additional questions for [name] at this time.   Westley Hummer, MSW, [place]  [phone]- work cell phone (preferred) [phone]- desk phone

## 2021-09-20 NOTE — Telephone Encounter (Signed)
Patient states she has multiple bruises on her left arm and hands and she has never had an issue like this before so she is very concerned.

## 2021-09-20 NOTE — Telephone Encounter (Signed)
Patient reports bruising on left arm with a knot. She thinks she hurt arm while doing laundry. Dr. Sallyanne Kuster (DOD) advised and of HGB of 10.2. Order for patient to elevate arm and use an ice pack on it. Patient voiced understanding.

## 2021-09-24 ENCOUNTER — Telehealth: Payer: Self-pay | Admitting: Internal Medicine

## 2021-09-24 NOTE — Telephone Encounter (Signed)
Returned patient's call. Patient reports it appears the bruising on her left arm is getting worse. She denies lightheadedness, or dizziness. I explain to the patient that I am not able to assess the severity of her bruising bc I cannot exam her. Since she just contacted Dr. Victorino December office on 09/1921 and her Hgb was 10.2, if she thinks her bruising is much worse, she should go to the emergency room for further evaluation. Patient verbally understood and appreciated.

## 2021-09-25 ENCOUNTER — Other Ambulatory Visit: Payer: Self-pay | Admitting: Cardiovascular Disease

## 2021-09-25 ENCOUNTER — Other Ambulatory Visit: Payer: Self-pay | Admitting: Gastroenterology

## 2021-09-25 DIAGNOSIS — K219 Gastro-esophageal reflux disease without esophagitis: Secondary | ICD-10-CM

## 2021-09-30 ENCOUNTER — Inpatient Hospital Stay (HOSPITAL_COMMUNITY)
Admission: AD | Admit: 2021-09-30 | Discharge: 2021-10-03 | DRG: 381 | Disposition: A | Discharge status: Skilled Nursing Facility | Payer: Medicare Other | Source: Other Acute Inpatient Hospital | Attending: Family Medicine | Admitting: Family Medicine

## 2021-09-30 ENCOUNTER — Encounter (HOSPITAL_COMMUNITY): Payer: Self-pay | Admitting: Internal Medicine

## 2021-09-30 ENCOUNTER — Other Ambulatory Visit: Payer: Self-pay | Admitting: Adult Health

## 2021-09-30 DIAGNOSIS — Z888 Allergy status to other drugs, medicaments and biological substances status: Secondary | ICD-10-CM | POA: Diagnosis not present

## 2021-09-30 DIAGNOSIS — Z87891 Personal history of nicotine dependence: Secondary | ICD-10-CM

## 2021-09-30 DIAGNOSIS — Z79899 Other long term (current) drug therapy: Secondary | ICD-10-CM | POA: Diagnosis not present

## 2021-09-30 DIAGNOSIS — E876 Hypokalemia: Secondary | ICD-10-CM | POA: Diagnosis present

## 2021-09-30 DIAGNOSIS — N184 Chronic kidney disease, stage 4 (severe): Secondary | ICD-10-CM | POA: Diagnosis present

## 2021-09-30 DIAGNOSIS — K449 Diaphragmatic hernia without obstruction or gangrene: Secondary | ICD-10-CM | POA: Diagnosis present

## 2021-09-30 DIAGNOSIS — Z8711 Personal history of peptic ulcer disease: Secondary | ICD-10-CM | POA: Diagnosis not present

## 2021-09-30 DIAGNOSIS — N179 Acute kidney failure, unspecified: Secondary | ICD-10-CM | POA: Diagnosis present

## 2021-09-30 DIAGNOSIS — I5042 Chronic combined systolic (congestive) and diastolic (congestive) heart failure: Secondary | ICD-10-CM | POA: Diagnosis present

## 2021-09-30 DIAGNOSIS — Z9049 Acquired absence of other specified parts of digestive tract: Secondary | ICD-10-CM

## 2021-09-30 DIAGNOSIS — R195 Other fecal abnormalities: Principal | ICD-10-CM | POA: Diagnosis present

## 2021-09-30 DIAGNOSIS — I4821 Permanent atrial fibrillation: Secondary | ICD-10-CM

## 2021-09-30 DIAGNOSIS — T391X1A Poisoning by 4-Aminophenol derivatives, accidental (unintentional), initial encounter: Secondary | ICD-10-CM | POA: Diagnosis present

## 2021-09-30 DIAGNOSIS — Z950 Presence of cardiac pacemaker: Secondary | ICD-10-CM

## 2021-09-30 DIAGNOSIS — M1990 Unspecified osteoarthritis, unspecified site: Secondary | ICD-10-CM | POA: Diagnosis present

## 2021-09-30 DIAGNOSIS — Y92009 Unspecified place in unspecified non-institutional (private) residence as the place of occurrence of the external cause: Secondary | ICD-10-CM

## 2021-09-30 DIAGNOSIS — D62 Acute posthemorrhagic anemia: Secondary | ICD-10-CM | POA: Diagnosis present

## 2021-09-30 DIAGNOSIS — Z90710 Acquired absence of both cervix and uterus: Secondary | ICD-10-CM

## 2021-09-30 DIAGNOSIS — F319 Bipolar disorder, unspecified: Secondary | ICD-10-CM | POA: Diagnosis present

## 2021-09-30 DIAGNOSIS — K2211 Ulcer of esophagus with bleeding: Principal | ICD-10-CM | POA: Diagnosis present

## 2021-09-30 DIAGNOSIS — I130 Hypertensive heart and chronic kidney disease with heart failure and stage 1 through stage 4 chronic kidney disease, or unspecified chronic kidney disease: Secondary | ICD-10-CM | POA: Diagnosis present

## 2021-09-30 DIAGNOSIS — M797 Fibromyalgia: Secondary | ICD-10-CM | POA: Diagnosis present

## 2021-09-30 DIAGNOSIS — Z880 Allergy status to penicillin: Secondary | ICD-10-CM

## 2021-09-30 DIAGNOSIS — G44209 Tension-type headache, unspecified, not intractable: Secondary | ICD-10-CM | POA: Diagnosis present

## 2021-09-30 DIAGNOSIS — K209 Esophagitis, unspecified without bleeding: Secondary | ICD-10-CM | POA: Diagnosis not present

## 2021-09-30 DIAGNOSIS — K2091 Esophagitis, unspecified with bleeding: Secondary | ICD-10-CM | POA: Diagnosis present

## 2021-09-30 DIAGNOSIS — T39391A Poisoning by other nonsteroidal anti-inflammatory drugs [NSAID], accidental (unintentional), initial encounter: Secondary | ICD-10-CM | POA: Diagnosis present

## 2021-09-30 DIAGNOSIS — I480 Paroxysmal atrial fibrillation: Secondary | ICD-10-CM | POA: Diagnosis present

## 2021-09-30 DIAGNOSIS — D631 Anemia in chronic kidney disease: Secondary | ICD-10-CM | POA: Diagnosis present

## 2021-09-30 DIAGNOSIS — K2289 Other specified disease of esophagus: Secondary | ICD-10-CM | POA: Diagnosis not present

## 2021-09-30 DIAGNOSIS — K921 Melena: Secondary | ICD-10-CM | POA: Diagnosis not present

## 2021-09-30 DIAGNOSIS — Z881 Allergy status to other antibiotic agents status: Secondary | ICD-10-CM | POA: Diagnosis not present

## 2021-09-30 DIAGNOSIS — K2100 Gastro-esophageal reflux disease with esophagitis, without bleeding: Secondary | ICD-10-CM | POA: Diagnosis not present

## 2021-09-30 LAB — CBC
HCT: 24.9 % — ABNORMAL LOW (ref 36.0–46.0)
HCT: 24.7 % — ABNORMAL LOW (ref 36.0–46.0)
Hemoglobin: 8 g/dL — ABNORMAL LOW (ref 12.0–15.0)
Hemoglobin: 8 g/dL — ABNORMAL LOW (ref 12.0–15.0)
MCH: 30.5 pg (ref 26.0–34.0)
MCH: 30.5 pg (ref 26.0–34.0)
MCHC: 32.1 g/dL (ref 30.0–36.0)
MCHC: 32.4 g/dL (ref 30.0–36.0)
MCV: 95 fL (ref 80.0–100.0)
MCV: 94.3 fL (ref 80.0–100.0)
Platelets: 180 10*3/uL (ref 150–400)
Platelets: 202 10*3/uL (ref 150–400)
RBC: 2.62 MIL/uL — ABNORMAL LOW (ref 3.87–5.11)
RBC: 2.62 MIL/uL — ABNORMAL LOW (ref 3.87–5.11)
RDW: 21.4 % — ABNORMAL HIGH (ref 11.5–15.5)
RDW: 21.3 % — ABNORMAL HIGH (ref 11.5–15.5)
WBC: 9.5 10*3/uL (ref 4.0–10.5)
WBC: 12.9 10*3/uL — ABNORMAL HIGH (ref 4.0–10.5)
nRBC: 0 % (ref 0.0–0.2)
nRBC: 0.2 % (ref 0.0–0.2)

## 2021-09-30 LAB — TYPE AND SCREEN
ABO/RH(D): A NEG
Antibody Screen: NEGATIVE

## 2021-09-30 LAB — MRSA NEXT GEN BY PCR, NASAL: MRSA by PCR Next Gen: NOT DETECTED

## 2021-09-30 MED ORDER — ONDANSETRON HCL 4 MG/2ML IJ SOLN: 4.0000 mg | Freq: Four times a day (QID) | INTRAMUSCULAR | Status: DC | PRN

## 2021-09-30 MED ORDER — FENTANYL CITRATE PF 50 MCG/ML IJ SOSY
12.5000 ug | PREFILLED_SYRINGE | Freq: Once | INTRAMUSCULAR | Status: AC
  Administered 2021-09-30: 12.5 ug via INTRAVENOUS
  Filled 2021-09-30: qty 1

## 2021-09-30 MED ORDER — ACETAMINOPHEN 650 MG RE SUPP: 650.0000 mg | Freq: Four times a day (QID) | RECTAL | Status: DC | PRN

## 2021-09-30 MED ORDER — SODIUM CHLORIDE 0.9% FLUSH
3.0000 mL | Freq: Two times a day (BID) | INTRAVENOUS | Status: DC
  Administered 2021-09-30 – 2021-10-03 (×5): 3 mL via INTRAVENOUS

## 2021-09-30 MED ORDER — PANTOPRAZOLE SODIUM 40 MG IV SOLR
40.0000 mg | Freq: Two times a day (BID) | INTRAVENOUS | Status: DC
  Administered 2021-09-30: 40 mg via INTRAVENOUS
  Filled 2021-09-30: qty 10

## 2021-09-30 MED ORDER — POLYETHYL GLYCOL-PROPYL GLYCOL 0.4-0.3 % OP SOLN: 1.0000 [drp] | Freq: Every day | OPHTHALMIC | Status: DC | PRN

## 2021-09-30 MED ORDER — LACTATED RINGERS IV SOLN: INTRAVENOUS | Status: DC

## 2021-09-30 MED ORDER — HYDRALAZINE HCL 20 MG/ML IJ SOLN: 5.0000 mg | INTRAMUSCULAR | Status: DC | PRN

## 2021-09-30 MED ORDER — POLYVINYL ALCOHOL 1.4 % OP SOLN
1.0000 [drp] | OPHTHALMIC | Status: DC | PRN
Start: 2021-09-30 — End: 2021-10-03

## 2021-09-30 MED ORDER — ACETAMINOPHEN 325 MG PO TABS
650.0000 mg | ORAL_TABLET | Freq: Four times a day (QID) | ORAL | Status: DC | PRN
  Administered 2021-09-30 – 2021-10-01 (×3): 650 mg via ORAL
  Filled 2021-09-30 (×5): qty 2

## 2021-09-30 MED ORDER — ZOLPIDEM TARTRATE 5 MG PO TABS
5.0000 mg | ORAL_TABLET | Freq: Every day | ORAL | Status: DC
  Administered 2021-09-30 – 2021-10-02 (×3): 5 mg via ORAL
  Filled 2021-09-30 (×3): qty 1

## 2021-09-30 MED ORDER — SODIUM CHLORIDE 0.9 % IV SOLN: INTRAVENOUS | Status: DC

## 2021-09-30 MED ORDER — CARVEDILOL 6.25 MG PO TABS: 6.2500 mg | ORAL_TABLET | Freq: Two times a day (BID) | ORAL | Status: DC

## 2021-09-30 MED ORDER — ONDANSETRON HCL 4 MG PO TABS: 4.0000 mg | ORAL_TABLET | Freq: Four times a day (QID) | ORAL | Status: DC | PRN

## 2021-09-30 NOTE — H&P (View-Only) (Signed)
UNASSIGNED CONSULT FOR  GI  Reason for Consult: GI Bleed Referring Physician: [facility]  [name] HPI: This is a 88 year old female with a PMH of a prepyloric ulcer (10/13/2012), esophagitis (03/03/2017 and 10/13/2012), diverticular bleed, afib on Eliquis, s/p pacemaker placement, CKD, and HTN admitted for complaints of abdominal pain and melena x 4 days.  The patient presented to [facility] for her complaints and she was noted to have a drop in her HGB from a baseline around 9-10 g/dL down to her admission value of 8.0 g/dL.  In 10/13/2012 she had complaints of melena and her EGD was positive for a described LA Grade D esophagitis and a antral/prepyloric channel ulcer.  A repeat EGD performed for unclear reasons by Dr. Oneida Alar on 03/03/2017 showed that she had a gastritis.  Even though her esophagus was reported as being normal, there was clear evidence of an esophagitis at the GE junction.  She states that she takes a generic PPI daily and it is helpful for her.  Past Medical History:  Diagnosis Date   Acute blood loss anemia 10/11/2012   Acute diverticulitis 08/24/2013   Acute on chronic combined systolic and diastolic CHF, NYHA class 4 (HCC) 11/15/2013   Antral ulcer 10/11/2012   Arrhythmia    atrial fibb   Atrial fibrillation (HCC)    Bipolar affective disorder ([place]) 03/23/2017   Cardiomyopathy, nonischemic (HCC)    Chronic anticoagulation 10/12/2012   CKD (chronic kidney disease) stage 3, GFR 30-59 ml/min ([place]) 10/12/2012   Depression    Erosive esophagitis 10/11/2012   Fibromyalgia    Glaucoma    H/O echocardiogram 2007   EF 40-45%,          Hypertension    Osteoarthritis    Pacemaker    Last saw cards 07/2013   Scoliosis     Past Surgical History:  Procedure Laterality Date   ABDOMINAL HYSTERECTOMY     APPENDECTOMY     BACK SURGERY     BIOPSY  03/03/2017   Procedure: BIOPSY;  Surgeon: Danie Binder, MD;  [facility];  Service: Endoscopy;;  gastric    BREAST SURGERY     CARDIAC CATHETERIZATION  12/08/2005   LAD AND LEFT MAIN WITH NO HIGH-GRADE STENOSIS. MILD DISEASE IN THE CX AND LAD SYSTEM. SEVERE LV DYSFUNCTION WITH DILATION OF THE LV. EF 15-20%. LV END-DIASTOLIC PRESSURE IS 90. +1 MR.   CHOLECYSTECTOMY     COLONOSCOPY N/A 03/03/2017   Dr. Oneida Alar: 5 colon polyps removed, adenomatous.  Diverticulosis.  99% of the colon was cleared but the cecum was not adequately seen.   CYSTOSCOPY N/A 02/24/2013   Procedure: CYSTOSCOPY WITH URETHRAL DILITATION;  Surgeon: Marissa Nestle, MD;  Location: [facility];  Service: Urology;  Laterality: N/A;   DOPPLER ECHOCARDIOGRAPHY N/A 05/30/2010   LV SIZE IS NORMAL. LV SYSTOLIC FUNCTION IS LOW NORMAL. EF=50-55%. MILD INFERIOR HYPOKINESIS.MILD TO MODERATE POSTERIOR WALL HYPOKINESIS.PACEMAKER LEAD IN THE RV. LA IS MILDLY DILATED. RA IS MODERATE TO SEVERLY DILATED. PACEMAKER LEAD IN THE RA. MILD CALCICICATION OF THE MV APPARATUS. MODERATE MR. MILD TO MODERATE TR. MILD PHTN.AV MILDLY SCLEROTIC.   ESOPHAGOGASTRODUODENOSCOPY N/A 10/13/2012   Dr. Gala Romney: severe ulcerative reflux esophagitis, question of Barrett's but negative path, single deep prepyloric antral ulcer, negative H.pylori   ESOPHAGOGASTRODUODENOSCOPY N/A 03/03/2017   Dr. Oneida Alar: Gastritis/duodenitis, no H. pylori   HERNIA REPAIR     right inguinal hernia and umbilical   LOWETR EXT VENOUS Bilateral 11-08-10  R & L- NO EVIDENCE OF THROMBUS OR THROMBOPHLEBITIS. THERE IS MILD AMOUNT OF SUBCUTANEOUS EDEMA NOTED WITHIN THE LEFT CALF AND ANKLE. R & L GSV AND SSV- NO VENOUS INSUFF NOTED.   NECK SURGERY     NUCLEAR STRESS TEST N/A 02/13/2009   NORMAL PATTERN OF PERFUSION IN ALL REGIONS. POST STRESS VENTICULAR SIZE IS NORMAL. POST  STESS EF 85%.  NORMAL MYOCARDIAL PERFUSION STUDY.   OPEN REDUCTION INTERNAL FIXATION (ORIF) DISTAL PHALANX Left 11/16/2018   Procedure: MIDDLE FINGER OPEN REDUCTION VERSUS RECONSTRUCTION;  Surgeon: Roseanne Kaufman, MD;  Location: [place];   Service: Orthopedics;  Laterality: Left;   PACEMAKER INSERTION     POLYPECTOMY  03/03/2017   Procedure: POLYPECTOMY;  Surgeon: Danie Binder, MD;  [facility];  Service: Endoscopy;;  colon   PPM GENERATOR CHANGEOUT N/A 06/18/2020   Procedure: PPM GENERATOR CHANGEOUT;  Surgeon: Sanda Klein, MD;  Location: [place] [facility];  Service: Cardiovascular;  Laterality: N/A;   TONSILLECTOMY     YAG LASER APPLICATION Bilateral 1/60/7371   Procedure: YAG LASER APPLICATION;  Surgeon: Williams Che, MD;  Location: [facility];  Service: Ophthalmology;  Laterality: Bilateral;    Family History  Problem Relation Age of Onset   Cancer Sister    Asthma Sister    Heart failure Brother    Pulmonary embolism Brother    Cancer Brother    Colon cancer Neg Hx     Social History:  reports that she quit smoking about 16 years ago. Her smoking use included cigarettes. She has a 15.00 pack-year smoking history. She has never used smokeless tobacco. She reports that she does not currently use alcohol. She reports that she does not use drugs.  Allergies:  Allergies  Allergen Reactions   Ciprofloxacin Other (See Comments)    Possibly caused diarrhea November 2018   Flagyl [Metronidazole] Other (See Comments)    Possibly caused diarrhea November 2018   Papaya Derivatives Hives   Iodine Rash and Other (See Comments)    REACTION:If injected,  Rash/irritated skin reaction "welts"   Penicillins Hives    DID THE REACTION INVOLVE: Swelling of the face/tongue/throat, SOB, or low BP? Unknown Sudden or severe rash/hives, skin peeling, or the inside of the mouth or nose? Yes Did it require medical treatment? Yes When did it last happen?    56 or 88 years old   If all above answers are "NO", may proceed with cephalosporin use.   Sulfa Antibiotics Rash    Medications: Scheduled:  sodium chloride flush  3 mL Intravenous Q12H   Continuous:  lactated ringers 100 mL/hr at 09/30/21 1124    Results  for orders placed or performed during the hospital encounter of 09/30/21 (from the past 24 hour(s))  CBC     Status: Abnormal   Collection Time: 09/30/21 11:17 AM  Result Value Ref Range   WBC 9.5 4.0 - 10.5 K/uL   RBC 2.62 (L) 3.87 - 5.11 MIL/uL   Hemoglobin 8.0 (L) 12.0 - 15.0 g/dL   HCT 24.9 (L) 36.0 - 46.0 %   MCV 95.0 80.0 - 100.0 fL   MCH 30.5 26.0 - 34.0 pg   MCHC 32.1 30.0 - 36.0 g/dL   RDW 21.4 (H) 11.5 - 15.5 %   Platelets 180 150 - 400 K/uL   nRBC 0.0 0.0 - 0.2 %  MRSA Next Gen by PCR, Nasal     Status: None   Collection Time: 09/30/21 12:00 PM  Specimen: Nasal Mucosa; Nasal Swab  Result Value Ref Range   MRSA by PCR Next Gen NOT DETECTED NOT DETECTED     No results found.  ROS:  As stated above in the HPI otherwise negative.  Blood pressure (!) 139/50, temperature 98 F (36.7 C), temperature source Oral, SpO2 91 %.    PE: Gen: NAD, Alert and Oriented HEENT:  [place]/AT, EOMI Neck: Supple, no LAD Lungs: CTA Bilaterally CV: RRR without M/G/R ABD: Soft, diffusely tender, nonacute, +BS Ext: No C/C/E  Assessment/Plan: 1) Melena. 2) Anemia. 3) History of PUD.   There is a drop in  her HGB and she does have a history of esophagitis as well as PUD.  Further evaluation with an EGD will be performed.  Plan: 1) EGD tomorrow with [facility]. 2) Continue PPI. 3) Monitor HGB and transfuse if necessary.  Nikisha Fleece D 09/30/2021, 4:28 PM

## 2021-09-30 NOTE — H&P (Signed)
History and Physical    Patient: Kimberly [name]:[id number] DOB: 12/01/1926 DOA: 09/30/2021 DOS: the patient was seen and examined on 09/30/2021 PCP: Curlene Labrum, MD  Patient coming from: Home - lives alone; [facility]: Social services liaison [place], [phone]   Chief Complaint: Melena  HPI: Kimberly [name] is a 88 y.o. female with medical history significant of diverticular bleeding, antral ulcer, and erosive esophagitis; afib on Eliquis; pacemaker placement; bipolar d/o; stage 3 CKD; and HTN presenting with melena.  She reports that she was tired of hurting and falling and EMS was tired of her calling.  She has osteoarthritis. +abdominal pain, chronic.  Constant headache.  Last colonoscopy - doesn't remember.  Her stools have been "like diarrhea" alternating with hard stools.  Stool is black for "a long time."    ER Course:  [facility] transfer, per Dr. Hal Hope:  88 year old female with history of CHF Atrial fibrillation on Eliquis, history of Peptic ulcer disease diverticular bleed presents with dark stools. Stool for occult blood was positive. Hemoglobin around 8.4 usually runs around 9-10 recently. No GI services at [facility]. Patient admitted for GI bleed in the setting of anticoagulation and further GI work up.      Review of Systems: As mentioned in the history of present illness. All other systems reviewed and are negative. Past Medical History:  Diagnosis Date   Acute blood loss anemia 10/11/2012   Acute diverticulitis 08/24/2013   Acute on chronic combined systolic and diastolic CHF, NYHA class 4 (HCC) 11/15/2013   Antral ulcer 10/11/2012   Arrhythmia    atrial fibb   Atrial fibrillation (HCC)    Bipolar affective disorder ([place]) 03/23/2017   Cardiomyopathy, nonischemic (HCC)    Chronic anticoagulation 10/12/2012   CKD (chronic kidney disease) stage 3, GFR 30-59 ml/min ([place]) 10/12/2012   Depression    Erosive esophagitis 10/11/2012   Fibromyalgia    Glaucoma    H/O  echocardiogram 2007   EF 40-45%,          Hypertension    Osteoarthritis    Pacemaker    Last saw cards 07/2013   Scoliosis    Past Surgical History:  Procedure Laterality Date   ABDOMINAL HYSTERECTOMY     APPENDECTOMY     BACK SURGERY     BIOPSY  03/03/2017   Procedure: BIOPSY;  Surgeon: Danie Binder, MD;  [facility];  Service: Endoscopy;;  gastric   BREAST SURGERY     CARDIAC CATHETERIZATION  12/08/2005   LAD AND LEFT MAIN WITH NO HIGH-GRADE STENOSIS. MILD DISEASE IN THE CX AND LAD SYSTEM. SEVERE LV DYSFUNCTION WITH DILATION OF THE LV. EF 15-20%. LV END-DIASTOLIC PRESSURE IS 90. +1 MR.   CHOLECYSTECTOMY     COLONOSCOPY N/A 03/03/2017   Dr. Oneida Alar: 5 colon polyps removed, adenomatous.  Diverticulosis.  99% of the colon was cleared but the cecum was not adequately seen.   CYSTOSCOPY N/A 02/24/2013   Procedure: CYSTOSCOPY WITH URETHRAL DILITATION;  Surgeon: Marissa Nestle, MD;  Location: [facility];  Service: Urology;  Laterality: N/A;   DOPPLER ECHOCARDIOGRAPHY N/A 05/30/2010   LV SIZE IS NORMAL. LV SYSTOLIC FUNCTION IS LOW NORMAL. EF=50-55%. MILD INFERIOR HYPOKINESIS.MILD TO MODERATE POSTERIOR WALL HYPOKINESIS.PACEMAKER LEAD IN THE RV. LA IS MILDLY DILATED. RA IS MODERATE TO SEVERLY DILATED. PACEMAKER LEAD IN THE RA. MILD CALCICICATION OF THE MV APPARATUS. MODERATE MR. MILD TO MODERATE TR. MILD PHTN.AV MILDLY SCLEROTIC.   ESOPHAGOGASTRODUODENOSCOPY N/A 10/13/2012  Dr. Gala Romney: severe ulcerative reflux esophagitis, question of Barrett's but negative path, single deep prepyloric antral ulcer, negative H.pylori   ESOPHAGOGASTRODUODENOSCOPY N/A 03/03/2017   Dr. Oneida Alar: Gastritis/duodenitis, no H. pylori   HERNIA REPAIR     right inguinal hernia and umbilical   LOWETR EXT VENOUS Bilateral 11-08-10   R & L- NO EVIDENCE OF THROMBUS OR THROMBOPHLEBITIS. THERE IS MILD AMOUNT OF SUBCUTANEOUS EDEMA NOTED WITHIN THE LEFT CALF AND ANKLE. R & L GSV AND SSV- NO VENOUS INSUFF NOTED.   NECK  SURGERY     NUCLEAR STRESS TEST N/A 02/13/2009   NORMAL PATTERN OF PERFUSION IN ALL REGIONS. POST STRESS VENTICULAR SIZE IS NORMAL. POST  STESS EF 85%.  NORMAL MYOCARDIAL PERFUSION STUDY.   OPEN REDUCTION INTERNAL FIXATION (ORIF) DISTAL PHALANX Left 11/16/2018   Procedure: MIDDLE FINGER OPEN REDUCTION VERSUS RECONSTRUCTION;  Surgeon: Roseanne Kaufman, MD;  Location: [place];  Service: Orthopedics;  Laterality: Left;   PACEMAKER INSERTION     POLYPECTOMY  03/03/2017   Procedure: POLYPECTOMY;  Surgeon: Danie Binder, MD;  [facility];  Service: Endoscopy;;  colon   PPM GENERATOR CHANGEOUT N/A 06/18/2020   Procedure: PPM GENERATOR CHANGEOUT;  Surgeon: Sanda Klein, MD;  Location: [place] [facility];  Service: Cardiovascular;  Laterality: N/A;   TONSILLECTOMY     YAG LASER APPLICATION Bilateral 5/62/1308   Procedure: YAG LASER APPLICATION;  Surgeon: Williams Che, MD;  Location: [facility];  Service: Ophthalmology;  Laterality: Bilateral;   Social History:  reports that she quit smoking about 16 years ago. Her smoking use included cigarettes. She has a 15.00 pack-year smoking history. She has never used smokeless tobacco. She reports that she does not currently use alcohol. She reports that she does not use drugs.  Allergies  Allergen Reactions   Ciprofloxacin Other (See Comments)    Possibly caused diarrhea November 2018   Flagyl [Metronidazole] Other (See Comments)    Possibly caused diarrhea November 2018   Papaya Derivatives Hives   Iodine Rash and Other (See Comments)    REACTION:If injected,  Rash/irritated skin reaction "welts"   Penicillins Hives    DID THE REACTION INVOLVE: Swelling of the face/tongue/throat, SOB, or low BP? Unknown Sudden or severe rash/hives, skin peeling, or the inside of the mouth or nose? Yes Did it require medical treatment? Yes When did it last happen?    76 or 88 years old   If all above answers are "NO", may proceed with cephalosporin use.    Sulfa Antibiotics Rash    Family History  Problem Relation Age of Onset   Cancer Sister    Asthma Sister    Heart failure Brother    Pulmonary embolism Brother    Cancer Brother    Colon cancer Neg Hx     Prior to Admission medications   Medication Sig Start Date End Date Taking? Authorizing Provider  acetaminophen (TYLENOL) 325 MG tablet Take 325 mg by mouth every 4 (four) hours as needed for headache.    [provider]  apixaban (ELIQUIS) 2.5 MG TABS tablet Take 1 tablet (2.5 mg total) by mouth 2 (two) times daily. 01/30/21   Lendon Colonel, NP  carvedilol (COREG) 6.25 MG tablet Take 1 tablet (6.25 mg total) by mouth 2 (two) times daily with a meal. Schedule an appointment for further refills, 1st attempt 09/16/21   Croitoru, Dani Gobble, MD  dicyclomine (BENTYL) 10 MG capsule Take 1 capsule (10 mg total) by mouth 3 (three)  times daily before meals. 05/24/21   Triplett, Tammy, PA-C  furosemide (LASIX) 40 MG tablet Take 1.5 tablets (60 mg total) by mouth daily. 10/24/20   Croitoru, Mihai, MD  Multiple Vitamins-Minerals (PRESERVISION AREDS 2) CAPS Take 1 capsule by mouth 2 (two) times daily.     [provider]  pantoprazole (PROTONIX) 40 MG tablet Take 1 tablet (40 mg total) by mouth daily before breakfast. Patient not taking: Reported on 05/24/2021 07/26/20   Erenest Rasher, PA-C  Polyethyl Glycol-Propyl Glycol (SYSTANE) 0.4-0.3 % SOLN Apply 1 drop to eye daily as needed (dry eyes).    [provider]  Polyethylene Glycol 400 (BLINK TEARS) 0.25 % SOLN Apply 1 drop to eye daily.    [provider]  potassium chloride SA (KLOR-CON M) 20 MEQ tablet TAKE 1 TABLET BY MOUTH  DAILY Patient not taking: Reported on 05/24/2021 05/02/21   Lendon Colonel, NP    Physical Exam: Vitals:   09/30/21 1000 09/30/21 1648 09/30/21 1652  BP: (!) 139/50  (!) 122/38  Resp:   16  Temp: 98 F (36.7 C) 98 F (36.7 C) 98 F (36.7 C)  TempSrc: Oral Oral Oral  SpO2:  91%  99%   General:  Appears calm and comfortable and is in NAD, very conversant Eyes:  PERRL, EOMI, normal lids, iris ENT:  grossly normal hearing, lips & tongue, mmm; poor dentition Neck:  no LAD, masses or thyromegaly Cardiovascular:  RRR, no m/r/g. No LE edema.  Respiratory:   CTA bilaterally with no wheezes/rales/rhonchi.  Normal respiratory effort. Abdomen:  soft, NT, ND Skin:  no rash or induration seen on limited exam Musculoskeletal:  grossly normal tone BUE/BLE, good ROM, no bony abnormality Psychiatric:  grossly normal mood and affect, speech fluent and appropriate, AOx3 Neurologic:  CN 2-12 grossly intact, moves all extremities in coordinated fashion   Radiological Exams on Admission: Independently reviewed - see discussion in A/P where applicable  No results found.  EKG: not done   Labs on Admission: I have personally reviewed the available labs and imaging studies at the time of the admission.  Pertinent labs at [facility]:  Heme positive WBC 10.7 Hgb 8.4 -> 8.0 at [facility]; prior 9.4 on 05/24/21 INR 1.14 BUN 74/Creatinine 1.76/GFR 27; 21/1.08/48 on 05/24/21 Albumin 3.2     Assessment and Plan: Principal Problem:   Occult GI bleeding    Occult GI Bleeding -Patient with h/o GI bleeding (diverticular, antral ulcer, and erosive esophagitis) presenting with melena, suggestive of upper GI bleeding. -She is a poor historian and further history was difficult to obtain. -Most likely diagnosis is gastric or duodenal ulcer, esophagitis or gastritis, or Mallory-Weiss tear. -She has Mobic listed on her med rec - will hold and consider dc -The patient is not tachycardic with normal blood pressure, suggesting subacute volume loss.  -Will admit to telemetry bed  -GI consulted, will follow up recommendations -NPO after MN for EGD tomorrow -LR at 100 mL/hr -Start IV pantoprazole 40 BID for patients with ongoing bleeding who need urgent EGD -Zofran IV for nausea -Avoid NSAIDs and  SQ heparin -Maintain IV access (2 large bore IVs if possible). -Type and screen were done -Monitor closely and follow cbc q12h, transfuse as necessary for Hbg <7   AKI on stage 3a CKD -Baseline creatinine appears to be 1.1-1.2, currently 1.76 -Likely associated with acute blood loss, hypovolemia -Will continue IVF and recheck in AM  Afib -Rate controlled -Hold Eliquis in the setting of GI bleed -  Has pacemaker  Bipolar d/o -Continue Ambien but decrease dose to 5 mg qhs based on her age  Stage 3 CKD   HTN -Continue carvedilol    Advance Care Planning:   Code Status: Full Code - discussed with patient at length and she is clear about this  Consults: GI  DVT Prophylaxis: SCDs  Family Communication: None present; I was unable to reach her social services representative at the time of admission  Severity of Illness: The appropriate patient status for this patient is INPATIENT. Inpatient status is judged to be reasonable and necessary in order to provide the required intensity of service to ensure the patient's safety. The patient's presenting symptoms, physical exam findings, and initial radiographic and laboratory data in the context of their chronic comorbidities is felt to place them at high risk for further clinical deterioration. Furthermore, it is not anticipated that the patient will be medically stable for discharge from the hospital within 2 midnights of admission.   * I certify that at the point of admission it is my clinical judgment that the patient will require inpatient hospital care spanning beyond 2 midnights from the point of admission due to high intensity of service, high risk for further deterioration and high frequency of surveillance required.*  Author: Karmen Bongo, MD 09/30/2021 6:25 PM  For on call review [email or web address].

## 2021-09-30 NOTE — Consult Note (Signed)
UNASSIGNED CONSULT FOR [facility]  Reason for Consult: GI Bleed Referring Physician: [facility]  [name] HPI: This is a 88 year old female with a PMH of a prepyloric ulcer (10/13/2012), esophagitis (03/03/2017 and 10/13/2012), diverticular bleed, afib on Eliquis, s/p pacemaker placement, CKD, and HTN admitted for complaints of abdominal pain and melena x 4 days.  The patient presented to [facility] for her complaints and she was noted to have a drop in her HGB from a baseline around 9-10 g/dL down to her admission value of 8.0 g/dL.  In 10/13/2012 she had complaints of melena and her EGD was positive for a described LA Grade D esophagitis and a antral/prepyloric channel ulcer.  A repeat EGD performed for unclear reasons by Dr. Oneida Alar on 03/03/2017 showed that she had a gastritis.  Even though her esophagus was reported as being normal, there was clear evidence of an esophagitis at the GE junction.  She states that she takes a generic PPI daily and it is helpful for her.  Past Medical History:  Diagnosis Date   Acute blood loss anemia 10/11/2012   Acute diverticulitis 08/24/2013   Acute on chronic combined systolic and diastolic CHF, NYHA class 4 (HCC) 11/15/2013   Antral ulcer 10/11/2012   Arrhythmia    atrial fibb   Atrial fibrillation (HCC)    Bipolar affective disorder ([place]) 03/23/2017   Cardiomyopathy, nonischemic (HCC)    Chronic anticoagulation 10/12/2012   CKD (chronic kidney disease) stage 3, GFR 30-59 ml/min ([place]) 10/12/2012   Depression    Erosive esophagitis 10/11/2012   Fibromyalgia    Glaucoma    H/O echocardiogram 2007   EF 40-45%,          Hypertension    Osteoarthritis    Pacemaker    Last saw cards 07/2013   Scoliosis     Past Surgical History:  Procedure Laterality Date   ABDOMINAL HYSTERECTOMY     APPENDECTOMY     BACK SURGERY     BIOPSY  03/03/2017   Procedure: BIOPSY;  Surgeon: Danie Binder, MD;  [facility];  Service: Endoscopy;;  gastric    BREAST SURGERY     CARDIAC CATHETERIZATION  12/08/2005   LAD AND LEFT MAIN WITH NO HIGH-GRADE STENOSIS. MILD DISEASE IN THE CX AND LAD SYSTEM. SEVERE LV DYSFUNCTION WITH DILATION OF THE LV. EF 15-20%. LV END-DIASTOLIC PRESSURE IS 90. +1 MR.   CHOLECYSTECTOMY     COLONOSCOPY N/A 03/03/2017   Dr. Oneida Alar: 5 colon polyps removed, adenomatous.  Diverticulosis.  99% of the colon was cleared but the cecum was not adequately seen.   CYSTOSCOPY N/A 02/24/2013   Procedure: CYSTOSCOPY WITH URETHRAL DILITATION;  Surgeon: Marissa Nestle, MD;  Location: [facility];  Service: Urology;  Laterality: N/A;   DOPPLER ECHOCARDIOGRAPHY N/A 05/30/2010   LV SIZE IS NORMAL. LV SYSTOLIC FUNCTION IS LOW NORMAL. EF=50-55%. MILD INFERIOR HYPOKINESIS.MILD TO MODERATE POSTERIOR WALL HYPOKINESIS.PACEMAKER LEAD IN THE RV. LA IS MILDLY DILATED. RA IS MODERATE TO SEVERLY DILATED. PACEMAKER LEAD IN THE RA. MILD CALCICICATION OF THE MV APPARATUS. MODERATE MR. MILD TO MODERATE TR. MILD PHTN.AV MILDLY SCLEROTIC.   ESOPHAGOGASTRODUODENOSCOPY N/A 10/13/2012   Dr. Gala Romney: severe ulcerative reflux esophagitis, question of Barrett's but negative path, single deep prepyloric antral ulcer, negative H.pylori   ESOPHAGOGASTRODUODENOSCOPY N/A 03/03/2017   Dr. Oneida Alar: Gastritis/duodenitis, no H. pylori   HERNIA REPAIR     right inguinal hernia and umbilical   LOWETR EXT VENOUS Bilateral 11-08-10  R & L- NO EVIDENCE OF THROMBUS OR THROMBOPHLEBITIS. THERE IS MILD AMOUNT OF SUBCUTANEOUS EDEMA NOTED WITHIN THE LEFT CALF AND ANKLE. R & L GSV AND SSV- NO VENOUS INSUFF NOTED.   NECK SURGERY     NUCLEAR STRESS TEST N/A 02/13/2009   NORMAL PATTERN OF PERFUSION IN ALL REGIONS. POST STRESS VENTICULAR SIZE IS NORMAL. POST  STESS EF 85%.  NORMAL MYOCARDIAL PERFUSION STUDY.   OPEN REDUCTION INTERNAL FIXATION (ORIF) DISTAL PHALANX Left 11/16/2018   Procedure: MIDDLE FINGER OPEN REDUCTION VERSUS RECONSTRUCTION;  Surgeon: Roseanne Kaufman, MD;  Location: [place];   Service: Orthopedics;  Laterality: Left;   PACEMAKER INSERTION     POLYPECTOMY  03/03/2017   Procedure: POLYPECTOMY;  Surgeon: Danie Binder, MD;  [facility];  Service: Endoscopy;;  colon   PPM GENERATOR CHANGEOUT N/A 06/18/2020   Procedure: PPM GENERATOR CHANGEOUT;  Surgeon: Sanda Klein, MD;  Location: [facility];  Service: Cardiovascular;  Laterality: N/A;   TONSILLECTOMY     YAG LASER APPLICATION Bilateral 1/32/4401   Procedure: YAG LASER APPLICATION;  Surgeon: Williams Che, MD;  Location: [facility];  Service: Ophthalmology;  Laterality: Bilateral;    Family History  Problem Relation Age of Onset   Cancer Sister    Asthma Sister    Heart failure Brother    Pulmonary embolism Brother    Cancer Brother    Colon cancer Neg Hx     Social History:  reports that she quit smoking about 16 years ago. Her smoking use included cigarettes. She has a 15.00 pack-year smoking history. She has never used smokeless tobacco. She reports that she does not currently use alcohol. She reports that she does not use drugs.  Allergies:  Allergies  Allergen Reactions   Ciprofloxacin Other (See Comments)    Possibly caused diarrhea November 2018   Flagyl [Metronidazole] Other (See Comments)    Possibly caused diarrhea November 2018   Papaya Derivatives Hives   Iodine Rash and Other (See Comments)    REACTION:If injected,  Rash/irritated skin reaction "welts"   Penicillins Hives    DID THE REACTION INVOLVE: Swelling of the face/tongue/throat, SOB, or low BP? Unknown Sudden or severe rash/hives, skin peeling, or the inside of the mouth or nose? Yes Did it require medical treatment? Yes When did it last happen?    71 or 88 years old   If all above answers are "NO", may proceed with cephalosporin use.   Sulfa Antibiotics Rash    Medications: Scheduled:  sodium chloride flush  3 mL Intravenous Q12H   Continuous:  lactated ringers 100 mL/hr at 09/30/21 1124    Results  for orders placed or performed during the hospital encounter of 09/30/21 (from the past 24 hour(s))  CBC     Status: Abnormal   Collection Time: 09/30/21 11:17 AM  Result Value Ref Range   WBC 9.5 4.0 - 10.5 K/uL   RBC 2.62 (L) 3.87 - 5.11 MIL/uL   Hemoglobin 8.0 (L) 12.0 - 15.0 g/dL   HCT 24.9 (L) 36.0 - 46.0 %   MCV 95.0 80.0 - 100.0 fL   MCH 30.5 26.0 - 34.0 pg   MCHC 32.1 30.0 - 36.0 g/dL   RDW 21.4 (H) 11.5 - 15.5 %   Platelets 180 150 - 400 K/uL   nRBC 0.0 0.0 - 0.2 %  MRSA Next Gen by PCR, Nasal     Status: None   Collection Time: 09/30/21 12:00 PM  Specimen: Nasal Mucosa; Nasal Swab  Result Value Ref Range   MRSA by PCR Next Gen NOT DETECTED NOT DETECTED     No results found.  ROS:  As stated above in the HPI otherwise negative.  Blood pressure (!) 139/50, temperature 98 F (36.7 C), temperature source Oral, SpO2 91 %.    PE: Gen: NAD, Alert and Oriented HEENT:  [place]/AT, EOMI Neck: Supple, no LAD Lungs: CTA Bilaterally CV: RRR without M/G/R ABD: Soft, diffusely tender, nonacute, +BS Ext: No C/C/E  Assessment/Plan: 1) Melena. 2) Anemia. 3) History of PUD.   There is a drop in  her HGB and she does have a history of esophagitis as well as PUD.  Further evaluation with an EGD will be performed.  Plan: 1) EGD tomorrow with [facility]. 2) Continue PPI. 3) Monitor HGB and transfuse if necessary.  Genee Rann D 09/30/2021, 4:28 PM

## 2021-10-01 ENCOUNTER — Encounter (HOSPITAL_COMMUNITY)
Admission: AD | Disposition: A | Discharge status: Skilled Nursing Facility | Payer: Self-pay | Source: Other Acute Inpatient Hospital | Attending: Family Medicine

## 2021-10-01 ENCOUNTER — Encounter (HOSPITAL_COMMUNITY): Payer: Self-pay | Admitting: Internal Medicine

## 2021-10-01 ENCOUNTER — Inpatient Hospital Stay (HOSPITAL_COMMUNITY): Payer: Medicare Other | Admitting: Certified Registered Nurse Anesthetist

## 2021-10-01 ENCOUNTER — Inpatient Hospital Stay (HOSPITAL_COMMUNITY): Payer: Medicare Other

## 2021-10-01 DIAGNOSIS — K2289 Other specified disease of esophagus: Secondary | ICD-10-CM

## 2021-10-01 DIAGNOSIS — K2100 Gastro-esophageal reflux disease with esophagitis, without bleeding: Secondary | ICD-10-CM

## 2021-10-01 DIAGNOSIS — R195 Other fecal abnormalities: Secondary | ICD-10-CM | POA: Diagnosis not present

## 2021-10-01 DIAGNOSIS — K209 Esophagitis, unspecified without bleeding: Secondary | ICD-10-CM | POA: Diagnosis not present

## 2021-10-01 DIAGNOSIS — K921 Melena: Secondary | ICD-10-CM

## 2021-10-01 DIAGNOSIS — D62 Acute posthemorrhagic anemia: Secondary | ICD-10-CM

## 2021-10-01 DIAGNOSIS — K449 Diaphragmatic hernia without obstruction or gangrene: Secondary | ICD-10-CM

## 2021-10-01 HISTORY — PX: ESOPHAGOGASTRODUODENOSCOPY (EGD) WITH PROPOFOL: SHX5813

## 2021-10-01 LAB — CBC
HCT: 22.3 % — ABNORMAL LOW (ref 36.0–46.0)
HCT: 23.7 % — ABNORMAL LOW (ref 36.0–46.0)
Hemoglobin: 7.4 g/dL — ABNORMAL LOW (ref 12.0–15.0)
Hemoglobin: 7.5 g/dL — ABNORMAL LOW (ref 12.0–15.0)
MCH: 30.5 pg (ref 26.0–34.0)
MCH: 30.1 pg (ref 26.0–34.0)
MCHC: 33.2 g/dL (ref 30.0–36.0)
MCHC: 31.6 g/dL (ref 30.0–36.0)
MCV: 91.8 fL (ref 80.0–100.0)
MCV: 95.2 fL (ref 80.0–100.0)
Platelets: 186 10*3/uL (ref 150–400)
Platelets: 192 10*3/uL (ref 150–400)
RBC: 2.43 MIL/uL — ABNORMAL LOW (ref 3.87–5.11)
RBC: 2.49 MIL/uL — ABNORMAL LOW (ref 3.87–5.11)
RDW: 21.2 % — ABNORMAL HIGH (ref 11.5–15.5)
RDW: 21.5 % — ABNORMAL HIGH (ref 11.5–15.5)
WBC: 9.9 10*3/uL (ref 4.0–10.5)
WBC: 8.3 10*3/uL (ref 4.0–10.5)
nRBC: 0.2 % (ref 0.0–0.2)
nRBC: 0.2 % (ref 0.0–0.2)

## 2021-10-01 LAB — BASIC METABOLIC PANEL
Anion gap: 9 (ref 5–15)
BUN: 44 mg/dL — ABNORMAL HIGH (ref 8–23)
CO2: 25 mmol/L (ref 22–32)
Calcium: 8 mg/dL — ABNORMAL LOW (ref 8.9–10.3)
Chloride: 103 mmol/L (ref 98–111)
Creatinine, Ser: 1.74 mg/dL — ABNORMAL HIGH (ref 0.44–1.00)
GFR, Estimated: 27 mL/min — ABNORMAL LOW (ref >60)
Glucose, Bld: 90 mg/dL (ref 70–99)
Potassium: 3 mmol/L — ABNORMAL LOW (ref 3.5–5.1)
Sodium: 137 mmol/L (ref 135–145)

## 2021-10-01 SURGERY — ESOPHAGOGASTRODUODENOSCOPY (EGD) WITH PROPOFOL
Anesthesia: Monitor Anesthesia Care

## 2021-10-01 MED ORDER — FENTANYL CITRATE PF 50 MCG/ML IJ SOSY
12.5000 ug | PREFILLED_SYRINGE | Freq: Once | INTRAMUSCULAR | Status: AC
  Administered 2021-10-01: 12.5 ug via INTRAVENOUS
  Filled 2021-10-01: qty 1

## 2021-10-01 MED ORDER — PROPOFOL 10 MG/ML IV BOLUS
INTRAVENOUS | Status: DC | PRN
  Administered 2021-10-01 (×2): 10 mg via INTRAVENOUS

## 2021-10-01 MED ORDER — HYDROMORPHONE HCL 2 MG PO TABS
2.0000 mg | ORAL_TABLET | ORAL | Status: DC | PRN
  Administered 2021-10-02: 2 mg via ORAL
  Filled 2021-10-01: qty 1

## 2021-10-01 MED ORDER — PROPOFOL 500 MG/50ML IV EMUL
INTRAVENOUS | Status: DC | PRN
  Administered 2021-10-01: 100 ug/kg/min via INTRAVENOUS

## 2021-10-01 MED ORDER — SODIUM CHLORIDE 0.9 % IV SOLN
INTRAVENOUS | Status: AC | PRN
  Administered 2021-10-01: 500 mL via INTRAVENOUS

## 2021-10-01 MED ORDER — PANTOPRAZOLE SODIUM 40 MG PO TBEC
40.0000 mg | DELAYED_RELEASE_TABLET | Freq: Two times a day (BID) | ORAL | Status: DC
  Administered 2021-10-01 – 2021-10-03 (×4): 40 mg via ORAL
  Filled 2021-10-01 (×4): qty 1

## 2021-10-01 MED ORDER — TRAMADOL HCL 50 MG PO TABS
50.0000 mg | ORAL_TABLET | Freq: Two times a day (BID) | ORAL | Status: DC | PRN
  Administered 2021-10-01: 50 mg via ORAL
  Filled 2021-10-01: qty 1

## 2021-10-01 MED ORDER — SODIUM CHLORIDE 0.45 % IV SOLN
INTRAVENOUS | Status: AC
  Filled 2021-10-01: qty 1000

## 2021-10-01 SURGICAL SUPPLY — 15 items

## 2021-10-01 NOTE — Telephone Encounter (Signed)
Prescription refill request for Eliquis received. Indication: Afib  Last office visit: 08/03/20 Joline Salt)  Scr: 1.74 (10/01/21)  Age: 87 Weight: 62kg  Pt overdue for office visit. Message sent to schedules. Appropriate dose and refill sent to requested pharmacy.

## 2021-10-01 NOTE — Op Note (Signed)
[facility] Patient Name: Kimberly [name] Procedure Date : 10/01/2021 MRN: [id number] Attending MD: Jerene Bears , MD Date of Birth: Jun 26, 1926 CSN: [id number] Age: 88 Admit Type: Inpatient Procedure:                Upper GI endoscopy Indications:              Acute post hemorrhagic anemia, Melena Providers:                Lajuan Lines. Hilarie Fredrickson, MD, Jeanella Cara, RN, Silverio Lay, Technician Referring MD:             [facility] Medicines:                Monitored Anesthesia Care Complications:            No immediate complications. Estimated Blood Loss:     Estimated blood loss: none. Procedure:                Pre-Anesthesia Assessment:                           - Prior to the procedure, a History and Physical                            was performed, and patient medications and                            allergies were reviewed. The patient's tolerance of                            previous anesthesia was also reviewed. The risks                            and benefits of the procedure and the sedation                            options and risks were discussed with the patient.                            All questions were answered, and informed consent                            was obtained. Prior Anticoagulants: The patient has                            taken Eliquis (apixaban), last dose was 1 day prior                            to procedure. ASA Grade Assessment: III - A patient                            with severe systemic disease. After reviewing the  risks and benefits, the patient was deemed in                            satisfactory condition to undergo the procedure.                           After obtaining informed consent, the endoscope was                            passed under direct vision. Throughout the                            procedure, the patient's blood pressure,  pulse, and                            oxygen saturations were monitored continuously. The                            [id number]-H190 ([id number]) Olympus endoscope was introduced                            through the mouth, and advanced to the second part                            of duodenum. The upper GI endoscopy was                            accomplished without difficulty. The patient                            tolerated the procedure well. Scope In: Scope Out: Findings:      There were esophageal mucosal changes suspicious for short-segment       Barrett's esophagus present in the lower third of the esophagus (38-40       cm from the incisors). The maximum longitudinal extent of these mucosal       changes was 2 cm in length. Biopsies were not performed today given this       will very likely not have clinical consequences for patient. No       nodularity or concerning endoscopic features.      LA Grade B (one or more mucosal breaks greater than 5 mm, not extending       between the tops of two mucosal folds) esophagitis with no bleeding was       found at the gastroesophageal junction. At one location at [place] junction       there was a clean-based ulceration.      A 5 cm hiatal hernia was present.      The gastroesophageal flap valve was visualized endoscopically and       classified as Hill Grade IV (no fold, wide open lumen, hiatal hernia       present).      The entire examined stomach was normal.      The examined duodenum was normal. Impression:               - Esophageal mucosal changes suspicious for  short-segment Barrett's esophagus.                           - LA Grade B reflux esophagitis with ulcer                            (clean-based) with no bleeding stigmata. This is a                            likely source of melena in setting of                            anticoagulation.                           - 5 cm hiatal hernia.                            - Normal stomach.                           - Normal examined duodenum.                           - No specimens collected. Moderate Sedation:      N/A Recommendation:           - Return patient to hospital ward for ongoing care.                           - Advance diet as tolerated.                           - Continue present medications. Increase to BID PPI                            x 8 weeks, then daily. Would hold Eliquis x 1 week                            to allow time for esophagitis healing. Then can                            resume with close attention to blood counts and for                            any recurrent melena.                           - GI will sign off, but remain available. Call if                            questions. Procedure Code(s):        --- Professional ---                           [id number], Esophagogastroduodenoscopy, flexible,  transoral; diagnostic, including collection of                            specimen(s) by brushing or washing, when performed                            (separate procedure) Diagnosis Code(s):        --- Professional ---                           K22.8, Other specified diseases of esophagus                           K21.00, Gastro-esophageal reflux disease with                            esophagitis, without bleeding                           K44.9, Diaphragmatic hernia without obstruction or                            gangrene                           D62, Acute posthemorrhagic anemia                           K92.1, Melena (includes Hematochezia) CPT copyright 2019 American Medical Association. All rights reserved. The codes documented in this report are preliminary and upon coder review may  be revised to meet current compliance requirements. Jerene Bears, MD 10/01/2021 10:00:34 AM This report has been signed electronically. Number of Addenda: 0

## 2021-10-01 NOTE — Interval H&P Note (Signed)
History and Physical Interval Note: For upper endoscopy today to evaluate melena and acute blood loss anemia.  History of esophagitis and peptic ulcer disease.  Prior upper endoscopy in 2014 in 2018.  Previous GI care at [facility]. Denies pain now other than in her back which is chronic Hemoglobin 7.4 down slightly from yesterday.  The nature of the procedure, as well as the risks, benefits, and alternatives were carefully and thoroughly reviewed with the patient. Ample time for discussion and questions allowed. The patient understood, was satisfied, and agreed to proceed.       Latest Ref Rng & Units 10/01/2021    6:09 AM 09/30/2021    5:28 PM 09/30/2021   11:17 AM  CBC  WBC 4.0 - 10.5 K/uL 9.9   12.9   9.5    Hemoglobin 12.0 - 15.0 g/dL 7.4   8.0   8.0    Hematocrit 36.0 - 46.0 % 22.3   24.7   24.9    Platelets 150 - 400 K/uL 186   202   180       10/01/2021 9:14 AM  [name]  has presented today for surgery, with the diagnosis of Melena and anemia.  The various methods of treatment have been discussed with the patient and family. After consideration of risks, benefits and other options for treatment, the patient has consented to  Procedure(s): ESOPHAGOGASTRODUODENOSCOPY (EGD) WITH PROPOFOL (N/A) as a surgical intervention.  The patient's history has been reviewed, patient examined, no change in status, stable for surgery.  I have reviewed the patient's chart and labs.  Questions were answered to the patient's satisfaction.     Lajuan Lines Samika Vetsch

## 2021-10-01 NOTE — Anesthesia Preprocedure Evaluation (Signed)
Anesthesia Evaluation  Patient identified by MRN, date of birth, ID band Patient awake    Reviewed: Allergy & Precautions, NPO status , Patient's Chart, lab work & pertinent test results  Airway Mallampati: II  TM Distance: >3 FB Neck ROM: Full    Dental  (+) Dental Advisory Given   Pulmonary former smoker,    breath sounds clear to auscultation       Cardiovascular hypertension, Pt. on medications and Pt. on home beta blockers +CHF  + dysrhythmias Atrial Fibrillation + pacemaker  Rhythm:Regular Rate:Normal     Neuro/Psych  Neuromuscular disease    GI/Hepatic Neg liver ROS, PUD, GERD  ,  Endo/Other  negative endocrine ROS  Renal/GU CRFRenal disease     Musculoskeletal  (+) Arthritis , Fibromyalgia -  Abdominal   Peds  Hematology  (+) Blood dyscrasia, anemia ,   Anesthesia Other Findings   Reproductive/Obstetrics                             Anesthesia Physical Anesthesia Plan  ASA: 3  Anesthesia Plan: MAC   Post-op Pain Management:    Induction:   PONV Risk Score and Plan: 2 and Propofol infusion, Ondansetron and Treatment may vary due to age or medical condition  Airway Management Planned: Natural Airway and Nasal Cannula  Additional Equipment:   Intra-op Plan:   Post-operative Plan:   Informed Consent: I have reviewed the patients History and Physical, chart, labs and discussed the procedure including the risks, benefits and alternatives for the proposed anesthesia with the patient or authorized representative who has indicated his/her understanding and acceptance.       Plan Discussed with: CRNA  Anesthesia Plan Comments:         Anesthesia Quick Evaluation

## 2021-10-01 NOTE — Anesthesia Procedure Notes (Signed)
Procedure Name: MAC Date/Time: 10/01/2021 9:29 AM Performed by: Janene Harvey, CRNA Pre-anesthesia Checklist: Patient identified, Patient being monitored, Suction available and Emergency Drugs available Patient Re-evaluated:Patient Re-evaluated prior to induction Oxygen Delivery Method: Nasal cannula Induction Type: IV induction Placement Confirmation: positive ETCO2 Dental Injury: Teeth and Oropharynx as per pre-operative assessment

## 2021-10-01 NOTE — Evaluation (Signed)
Physical Therapy Evaluation Patient Details Name: Kimberly [name] MRN: [id number] DOB: 1927-03-25 Today's Date: 10/01/2021  History of Present Illness  Patient is a 88 y/o female who presents on 5/29 with falls and abdominal pain. Found to have GI bleed in setting of anticoagulation. s/p EGD 5/30. PMH includes A-fib on chronic anticogulation, bipolar disorder, CKD, fibromyalgia, HTN, PPM, back surgery, cardiomyopathy.  Clinical Impression  Patient presents with generalized weakness, pain, decreased activity tolerance, impaired balance and impaired mobility s/p above. Pt currently lives alone in a house with mold and awaiting moving to her new apt in June. It sounds like it might be an ILF facility? Pt is unsure. Attempted calling her social worker however could not get through to her. How much support she will get greatly impacts recommendations by this PT. Pt with limited mobility tolerance during session today, Min A for standing and taking a few steps forwards/backwards but self limiting due to pain/fatigue. HR up to 121 bpm. Concerned about pt going home without support due to hx of falls. Might consider SNF to maximize independence and mobility prior to d/c to new apt. Will follow acutely.      Recommendations for follow up therapy are one component of a multi-disciplinary discharge planning process, led by the attending physician.  Recommendations may be updated based on patient status, additional functional criteria and insurance authorization.  Follow Up Recommendations Skilled nursing-short term rehab (<3 hours/day) (pending discussion with social worker (person who assists her))    Assistance Recommended at Discharge Intermittent Supervision/Assistance  Patient can return home with the following  A little help with walking and/or transfers;A little help with bathing/dressing/bathroom;Assistance with cooking/housework;Direct supervision/assist for financial management;Assist for  transportation;Help with stairs or ramp for entrance;Direct supervision/assist for medications management    Equipment Recommendations None recommended by PT  Recommendations for Other Services       Functional Status Assessment Patient has had a recent decline in their functional status and demonstrates the ability to make significant improvements in function in a reasonable and predictable amount of time.     Precautions / Restrictions Precautions Precautions: Fall Precaution Comments: hx of falls Restrictions Weight Bearing Restrictions: No      Mobility  Bed Mobility Overal bed mobility: Needs Assistance Bed Mobility: Rolling, Sidelying to Sit, Sit to Supine Rolling: Min guard Sidelying to sit: Min guard, HOB elevated   Sit to supine: Min guard, HOB elevated   General bed mobility comments: Increased time/effort to get to EOB, use of rail.    Transfers Overall transfer level: Needs assistance Equipment used: Rolling walker (2 wheels) Transfers: Sit to/from Stand Sit to Stand: Min assist           General transfer comment: Min A to power to standing with cues for hand placement, flexed trunk. Slight posterior lean    Ambulation/Gait Ambulation/Gait assistance: Min assist Gait Distance (Feet): 2 Feet (forwards/backwards) Assistive device: Rolling walker (2 wheels)   Gait velocity: decreased     General Gait Details: Able to take a few steps forwards/backwards with Min A for support, limited due to pain/weakness. HR up to 121 bpm.  Stairs            Wheelchair Mobility    Modified Rankin (Stroke Patients Only)       Balance Overall balance assessment: Needs assistance Sitting-balance support: Feet supported, No upper extremity supported Sitting balance-Leahy Scale: Fair     Standing balance support: During functional activity Standing balance-Leahy Scale: Poor Standing balance comment:  Requires UE support in standing.                              Pertinent Vitals/Pain Pain Assessment Pain Assessment: Faces Faces Pain Scale: Hurts little more Pain Location: chronic- back/LEs Pain Descriptors / Indicators: Discomfort, Sore Pain Intervention(s): Monitored during session, Repositioned, Limited activity within patient's tolerance    Home Living Family/patient expects to be discharged to:: Private residence Living Arrangements: Alone Available Help at Discharge: Other (Comment) (Socials ervices rep) Type of Home: House Home Access: Stairs to enter Entrance Stairs-Rails: None Entrance Stairs-Number of Steps: 1   Home Layout: One level Home Equipment: Conservation officer, nature (2 wheels);BSC/3in1;Wheelchair - manual Additional Comments: Supposed to be moving to a new apt, sounds like an ILF?? brand new in June. Will need to call Social services worker to get more info    Prior Function Prior Level of Function : Needs assist             Mobility Comments: Uses RW for ambulation, limited ambulator, falls. ("too many to count.") has been sleeping on the couch due to mold in her bedroom. ASsist with getting to appts/groceries. ADLs Comments: does not clean, still cooks for herself a little. manages easy slip on clothing. reports only one bathroom in bedroom so has been wearing a diaper due to mold in her bedroom/bathroom.     Hand Dominance   Dominant Hand: Right    Extremity/Trunk Assessment   Upper Extremity Assessment Upper Extremity Assessment: Defer to OT evaluation    Lower Extremity Assessment Lower Extremity Assessment: Generalized weakness;RLE deficits/detail;LLE deficits/detail RLE Sensation: history of peripheral neuropathy;decreased light touch LLE Sensation: history of peripheral neuropathy;decreased light touch    Cervical / Trunk Assessment Cervical / Trunk Assessment: Other exceptions Cervical / Trunk Exceptions: scoliosis  Communication   Communication: No difficulties  Cognition  Arousal/Alertness: Awake/alert Behavior During Therapy: WFL for tasks assessed/performed Overall Cognitive Status: No family/caregiver present to determine baseline cognitive functioning                                 General Comments: follows commands, tangential at times, able to be redirected. Does not know where she is. Able to state month and year.        General Comments General comments (skin integrity, edema, etc.): HR up to 121 bpm with activity.    Exercises     Assessment/Plan    PT Assessment Patient needs continued PT services  PT Problem List Decreased strength;Decreased range of motion;Decreased mobility;Pain;Decreased balance;Impaired sensation;Decreased activity tolerance;Decreased cognition;Decreased skin integrity;Cardiopulmonary status limiting activity;Decreased coordination;Decreased safety awareness       PT Treatment Interventions Therapeutic activities;Gait training;DME instruction;Patient/family education;Therapeutic exercise;Balance training;Functional mobility training;Cognitive remediation    PT Goals (Current goals can be found in the Care Plan section)  Acute Rehab PT Goals Patient Stated Goal: move to my new place PT Goal Formulation: With patient Time For Goal Achievement: 10/15/21 Potential to Achieve Goals: Fair    Frequency Min 3X/week     Co-evaluation               AM-PAC PT "6 Clicks" Mobility  Outcome Measure Help needed turning from your back to your side while in a flat bed without using bedrails?: A Little Help needed moving from lying on your back to sitting on the side of a flat bed without using bedrails?: A  Little Help needed moving to and from a bed to a chair (including a wheelchair)?: A Little Help needed standing up from a chair using your arms (e.g., wheelchair or bedside chair)?: A Little Help needed to walk in hospital room?: Total Help needed climbing 3-5 steps with a railing? : Total 6 Click  Score: 14    End of Session Equipment Utilized During Treatment: Gait belt Activity Tolerance: Patient limited by pain;Patient limited by fatigue Patient left: in bed;with call bell/phone within reach;with bed alarm set Nurse Communication: Mobility status PT Visit Diagnosis: Muscle weakness (generalized) (M62.81);Unsteadiness on feet (R26.81);Difficulty in walking, not elsewhere classified (R26.2);Pain Pain - Right/Left:  (bil) Pain - part of body: Leg (back)    Time: 3300-7622 PT Time Calculation (min) (ACUTE ONLY): 24 min   Charges:   PT Evaluation $PT Eval Moderate Complexity: 1 Mod PT Treatments $Therapeutic Activity: 8-22 mins        Marisa Severin, PT, DPT Acute Rehabilitation Services Secure chat preferred Office [place] 10/01/2021, 12:40 PM

## 2021-10-01 NOTE — Transfer of Care (Signed)
Immediate Anesthesia Transfer of Care Note  Patient: Kimberly [name]  Procedure(s) Performed: ESOPHAGOGASTRODUODENOSCOPY (EGD) WITH PROPOFOL  Patient Location: PACU  Anesthesia Type:MAC  Level of Consciousness: drowsy and patient cooperative  Airway & Oxygen Therapy: Patient Spontanous Breathing and Patient connected to nasal cannula oxygen  Post-op Assessment: Report given to RN and Post -op Vital signs reviewed and stable  Post vital signs: Reviewed and stable  Last Vitals:  Vitals Value Taken Time  BP    Temp    Pulse    Resp    SpO2      Last Pain:  Vitals:   10/01/21 0816  TempSrc: Oral  PainSc:          Complications: No notable events documented.

## 2021-10-01 NOTE — NC FL2 (Signed)
[place] LEVEL OF CARE SCREENING TOOL     IDENTIFICATION  Patient Name: Kimberly [name] Birthdate: 1927-02-13 Sex: female Admission Date (Current Location): 09/30/2021  [facility] and [place] Number:  Herbalist and Address:  [facility], [place] [address] [place], [place] [address]      Provider Number: [id number]  Attending Physician Name and Address:  Patrecia Pour, MD  Relative Name and Phone Number:  Baldo Ash work ext [phone]    Current Level of Care: Hospital Recommended Level of Care: [place] Prior Approval Number:    Date Approved/Denied:   PASRR Number:    Discharge Plan: Other (Comment)    Current Diagnoses: Patient Active Problem List   Diagnosis Date Noted   Acute esophagitis    Occult GI bleeding 09/30/2021   Alternating constipation and diarrhea 07/26/2020   Gastroesophageal reflux disease 07/26/2020   Dark stools 07/26/2020   Pacemaker battery depletion    Dyspnea 06/17/2020   Chest pain 06/17/2020   Right sided abdominal pain 01/24/2020   Finger dislocation, initial encounter 11/16/2018   Hand trauma, left, initial encounter 11/16/2018   Acute blood loss anemia 10/25/2018   Rectal bleeding 10/23/2018   Acute GI bleeding    Enteritis of small intestine due to enterotoxigenic Escherichia coli associated with diarrhea 03/24/2017   Bipolar affective disorder ([place]) 03/23/2017   Olfactory hallucinations 03/23/2017   Lower GI bleed    Glaucoma 02/28/2017   Pacemaker 03/21/2015   Non-ischemic cardiomyopathy ([place]) 11/28/2014   PUD (peptic ulcer disease) 06/14/2014   History of bipolar disorder    Hypokalemia 05/26/2014   Fall at home 05/26/2014   HFpEF (heart failure with preverved EF) 11/15/2013   Complete heart block (HCC) 11/15/2013   Inability to ambulate due to ankle or foot 09/05/2013   Unspecified constipation 08/26/2013   Anemia 08/25/2013   Acute  diverticulitis 08/24/2013   CKD (chronic kidney disease) stage 3, GFR 30-59 ml/min (HCC) 10/12/2012   Chronic anticoagulation 10/12/2012   Osteoarthritis    Permanent atrial fibrillation    Biventricular cardiac pacemaker - [employer] Consulta    Fibromyalgia    Essential hypertension    Depression     Orientation RESPIRATION BLADDER Height & Weight     Time, Self, Situation, Place  Normal Incontinent, External catheter Weight: 136 lb 11 oz (62 kg) Height:  '5\' 7"'$  (170.2 cm)  BEHAVIORAL SYMPTOMS/MOOD NEUROLOGICAL BOWEL NUTRITION STATUS      Continent Diet (See DC Summary)  AMBULATORY STATUS COMMUNICATION OF NEEDS Skin   Limited Assist Verbally Surgical wounds                       Personal Care Assistance Level of Assistance  Bathing, Dressing, Feeding Bathing Assistance: Limited assistance Feeding assistance: Independent Dressing Assistance: Limited assistance     Functional Limitations Info  Sight, Hearing, Speech Sight Info: Adequate Hearing Info: Adequate Speech Info: Adequate    SPECIAL CARE FACTORS FREQUENCY  PT (By licensed PT), OT (By licensed OT)     PT Frequency: 3x a week OT Frequency: 3x a week            Contractures Contractures Info: Not present    Additional Factors Info  Code Status, Allergies Code Status Info: Full Allergies Info: Ciprofloxacin   Flagyl (Metronidazole)   Papaya Derivatives   Iodine   Penicillins   Sulfa Antibiotics  Current Medications (10/01/2021):  This is the current hospital active medication list Current Facility-Administered Medications  Medication Dose Route Frequency Provider Last Rate Last Admin   acetaminophen (TYLENOL) tablet 650 mg  650 mg Oral Q6H PRN Karmen Bongo, MD   650 mg at 10/01/21 1201   Or   acetaminophen (TYLENOL) suppository 650 mg  650 mg Rectal Q6H PRN Karmen Bongo, MD       hydrALAZINE (APRESOLINE) injection 5 mg  5 mg Intravenous Q4H PRN Karmen Bongo, MD       lactated  ringers infusion   Intravenous Continuous Karmen Bongo, MD 100 mL/hr at 10/01/21 0254 New Bag at 10/01/21 0254   ondansetron (ZOFRAN) tablet 4 mg  4 mg Oral Q6H PRN Karmen Bongo, MD       Or   ondansetron [facility]) injection 4 mg  4 mg Intravenous Q6H PRN Karmen Bongo, MD       pantoprazole (PROTONIX) EC tablet 40 mg  40 mg Oral BID AC Pyrtle, Lajuan Lines, MD       polyvinyl alcohol (LIQUIFILM TEARS) 1.4 % ophthalmic solution 1 drop  1 drop Both Eyes PRN Karmen Bongo, MD       sodium chloride 0.45 % 1,000 mL with potassium chloride 40 mEq infusion   Intravenous Continuous Vance Gather B, MD       sodium chloride flush (NS) 0.9 % injection 3 mL  3 mL Intravenous Q12H Karmen Bongo, MD   3 mL at 09/30/21 2218   zolpidem (AMBIEN) tablet 5 mg  5 mg Oral Ivery Quale, MD   5 mg at 09/30/21 2207     Discharge Medications: Please see discharge summary for a list of discharge medications.  Relevant Imaging Results:  Relevant Lab Results:   Additional Information SSN [place], [place]

## 2021-10-01 NOTE — Progress Notes (Addendum)
Progress Note  Patient: Kimberly [name] TMA:[id number] DOB: 1926/05/30  DOA: 09/30/2021  DOS: 10/01/2021    Brief hospital course: Kimberly [name] is a 88 y.o. female with a history of atrial fibrillation on eliquis, PUD, diverticular bleed who presented to melena, +FOBT, hgb 8.4 from baseline 9-10 and transferred from [facility] 5/29 for GI evaluation. Eliquis was held, and EGD performed 5/30 revealing esophagitis with mucosal changes suggestive of Barrett's esophagus, the suspected cause of melena. PPI BID and holding eliquis for a week was recommended.  Assessment and Plan: Upper GI bleeding: Due to esophagitis seen at EGD 5/30 by Dr. Hilarie Fredrickson. - Continue PPI BID x8 weeks per GI, hold eliquis 1 week. ADAT. - D/w pt to avoid NSAIDs which she takes regularly.   Acute blood loss anemia on anemia of CKD: Due GI Bleed. - Trend H/H. Hgb down to 7.4g/dl.   AKI on stage IIIa CKD: Due to hypovolemia.  - Given gentle IVF.   Hypokalemia: With AFib, will supplement  AFib s/p PPM: Paced rhythm.  - Holding eliquis x1 week. Discussed risk of stroke with patient who agrees to plan. - Continue coreg.  Bipolar disorder: Quiescent.   Headache: No focal deficits or red flags on exam, however pt has been on anticoagulation and with advanced age and severity, will r/o hemorrhage with CT. Etiology suspected to be at least somewhat related to medication overuse (uses tylenol and/or NSAIDs daily). Treat avoiding NSAIDs for now given GI bleed.  HTN:  - Continue coreg  Subjective: Last dark BM was yesterday. None thus far today. No new complaints. Reluctant to go back home because her house has mold and she lives alone. Has plan to enter ILF in about a month.  Objective: Vitals:   10/01/21 0945 10/01/21 0950 10/01/21 1000 10/01/21 1026  BP: (!) 112/45 (!) 112/45 (!) 124/53 (!) 112/51  Pulse:  64 62 62  Resp:  (!) 24 (!) 22 (!) 21  Temp:  98 F (36.7 C)  97.9 F (36.6 C)  TempSrc:    Oral  SpO2:  100%  100%   Weight:      Height:       Gen: Pleasant, elderly female in no distress Pulm: Nonlabored breathing room air. Clear CV: Regular paced rhythm without murmur, rub, or gallop. No JVD, no dependent edema. GI: Abdomen soft, modestly tender to deep palpation at right abdomen (ever since ruptured appendix), no rebound, non-distended, with normoactive bowel sounds.  Ext: Warm, dry, significant hand deformities c/w arthritis Skin: No rashes, lesions or ulcers on visualized skin. Neuro: Alert and oriented. No focal neurological deficits. Psych: Judgement and insight appear fair. Mood euthymic & affect congruent. Behavior is appropriate.    Data Personally reviewed: CBC: Recent Labs  Lab 09/30/21 1117 09/30/21 1728 10/01/21 0609  WBC 9.5 12.9* 9.9  HGB 8.0* 8.0* 7.4*  HCT 24.9* 24.7* 22.3*  MCV 95.0 94.3 91.8  PLT 180 202 256   Basic Metabolic Panel: Recent Labs  Lab 10/01/21 0609  NA 137  K 3.0*  CL 103  CO2 25  GLUCOSE 90  BUN 44*  CREATININE 1.74*  CALCIUM 8.0*   GFR: Estimated Creatinine Clearance: 20.1 mL/min (A) (by C-G formula based on SCr of 1.74 mg/dL (H)). Liver Function Tests: No results for input(s): AST, ALT, ALKPHOS, BILITOT, PROT, ALBUMIN in the last 168 hours. No results for input(s): LIPASE, AMYLASE in the last 168 hours. No results for input(s): AMMONIA in the last 168 hours. Coagulation  Profile: No results for input(s): INR, PROTIME in the last 168 hours. Cardiac Enzymes: No results for input(s): CKTOTAL, CKMB, CKMBINDEX, TROPONINI in the last 168 hours. BNP (last 3 results) No results for input(s): PROBNP in the last 8760 hours. HbA1C: No results for input(s): HGBA1C in the last 72 hours. CBG: No results for input(s): GLUCAP in the last 168 hours. Lipid Profile: No results for input(s): CHOL, HDL, LDLCALC, TRIG, CHOLHDL, LDLDIRECT in the last 72 hours. Thyroid Function Tests: No results for input(s): TSH, T4TOTAL, FREET4, T3FREE, THYROIDAB in  the last 72 hours. Anemia Panel: No results for input(s): VITAMINB12, FOLATE, FERRITIN, TIBC, IRON, RETICCTPCT in the last 72 hours. Urine analysis:    Component Value Date/Time   COLORURINE STRAW (A) 05/24/2021 1731   APPEARANCEUR CLEAR 05/24/2021 1731   LABSPEC 1.006 05/24/2021 1731   PHURINE 7.0 05/24/2021 1731   GLUCOSEU NEGATIVE 05/24/2021 1731   HGBUR NEGATIVE 05/24/2021 1731   BILIRUBINUR NEGATIVE 05/24/2021 1731   BILIRUBINUR neg 04/25/2014 0855   KETONESUR 5 (A) 05/24/2021 1731   PROTEINUR NEGATIVE 05/24/2021 1731   UROBILINOGEN 0.2 05/26/2014 1415   NITRITE NEGATIVE 05/24/2021 1731   LEUKOCYTESUR NEGATIVE 05/24/2021 1731   Recent Results (from the past 240 hour(s))  MRSA Next Gen by PCR, Nasal     Status: None   Collection Time: 09/30/21 12:00 PM   Specimen: Nasal Mucosa; Nasal Swab  Result Value Ref Range Status   MRSA by PCR Next Gen NOT DETECTED NOT DETECTED Final    Comment: (NOTE) The GeneXpert MRSA Assay (FDA approved for NASAL specimens only), is one component of a comprehensive MRSA colonization surveillance program. It is not intended to diagnose MRSA infection nor to guide or monitor treatment for MRSA infections. Test performance is not FDA approved in patients less than 83 years old. Performed at [facility], [place] [address] [place], [place] [address]      Family Communication: None at bedside  Disposition: Status is: Inpatient Remains inpatient appropriate because: Serial hemoglobin checks to confirm stability, anticipate DC back to home 5/31 pending PT evaluation. Planned Discharge Destination: Home      Patrecia Pour, MD 10/01/2021 1:49 PM Page by [email or web address]

## 2021-10-01 NOTE — Anesthesia Postprocedure Evaluation (Signed)
Anesthesia Post Note  Patient: Kimberly [name]  Procedure(s) Performed: ESOPHAGOGASTRODUODENOSCOPY (EGD) WITH PROPOFOL     Patient location during evaluation: PACU Anesthesia Type: MAC Level of consciousness: awake and alert Pain management: pain level controlled Vital Signs Assessment: post-procedure vital signs reviewed and stable Respiratory status: spontaneous breathing, nonlabored ventilation, respiratory function stable and patient connected to nasal cannula oxygen Cardiovascular status: stable and blood pressure returned to baseline Postop Assessment: no apparent nausea or vomiting Anesthetic complications: no   No notable events documented.  Last Vitals:  Vitals:   10/01/21 1000 10/01/21 1026  BP: (!) 124/53 (!) 112/51  Pulse: 62 62  Resp: (!) 22 (!) 21  Temp:  36.6 C  SpO2: 100%     Last Pain:  Vitals:   10/01/21 1026  TempSrc: Oral  PainSc:                  Tiajuana Amass

## 2021-10-01 NOTE — Progress Notes (Signed)
PT Cancellation Note  Patient Details Name: Kimberly [name] MRN: [id number] DOB: 06-06-1926   Cancelled Treatment:    Reason Eval/Treat Not Completed: Patient at procedure or test/unavailable Pt off floor at endo. Will follow.   Marguarite Arbour A Tyshawn Ciullo 10/01/2021, 9:44 AM Marisa Severin, PT, DPT Acute Rehabilitation Services Secure chat preferred Office [phone]

## 2021-10-01 NOTE — Plan of Care (Signed)

## 2021-10-01 NOTE — TOC Initial Note (Addendum)
Transition of Care [facility]) - Initial/Assessment Note    Patient Details  Name: Kimberly [name] MRN: [id number] Date of Birth: [birth year]  Transition of Care [facility]) CM/SW Contact:    Tresa Endo Phone Number: 10/01/2021, 2:10 PM  Clinical Narrative:                 CSW received consult. Pt has a DSS social worker Baldo Ash work ext [phone]  [phone]). CSW contacted [name] introduced self and explained role at the hospital.   Pt reports that PTA the pt lived at home alone in a home with mold but will be moving to a IDL on June 12th. [name] stated that pt may be able to move in early and go to the ALF earlier than the 12th instead of the IDL if they are notified ASAP. CSW contacted the ALF, they are sure that are able to take pt but would like to follow up with their director to confirm.   PT reports pt a click score of 14 and minA walking 21f with a RW. Pt needs assistance with ADLs and medication management. CSW reviewed PT/OT recommendations and spoke with PT about pt DC plan depending on pt assistance at home/facility. If pt is able to go to the ALF with PT [place] but if pt cannot move in early to the ALF pt will require SNF.   CSW will continue to follow.    Expected Discharge Plan: Assisted Living Barriers to Discharge: Continued Medical Work up   Patient Goals and CMS Choice Patient states their goals for this hospitalization and ongoing recovery are:: PT at ALF CMS Medicare.gov Compare Post Acute Care list provided to:: Patient Choice offered to / list presented to : Patient  Expected Discharge Plan and Services Expected Discharge Plan: Assisted Living In-house Referral: Clinical Social Work   [place] Care [place] arrangements for the past 2 months: Apartment                                      Prior Living Arrangements/Services Living arrangements for the past 2 months:  Apartment Lives with:: Facility Resident (Will be transitioning to ALF at DC) Patient language and need for interpreter reviewed:: Yes Do you feel safe going back to the place where you live?: Yes      Need for Family Participation in Patient Care: Yes (Comment) Care giver support system in place?: Yes (comment)   Criminal Activity/Legal Involvement Pertinent to Current Situation/Hospitalization: No - Comment as needed  Activities of Daily Living      Permission Sought/Granted Permission sought to share information with : Facility CSport and exercise psychologist Other (comment) (SEducation officer, museum Permission granted to share information with : Yes, Verbal Permission Granted  Share Information with NAME: Bullins,Michelle work ext [phone](Other)   [phone] Permission granted to share info w AGENCY: [place] or RLexmark Internationalgranted to share info w Relationship: Bullins,Michelle work ext [employer](Other)   [phone] Permission granted to share info w Contact Information: Bullins,Michelle work ext [phone](Other)   [phone] Emotional Assessment Appearance:: Appears stated age Attitude/Demeanor/Rapport: Gracious Affect (typically observed): Accepting Orientation: : Oriented to Self, Oriented to Place, Oriented to  Time, Oriented to Situation Alcohol / Substance Use: Not Applicable Psych Involvement: No (comment)  Admission diagnosis:  Occult GI bleeding [R19.5] Patient Active Problem List  Diagnosis Date Noted   Acute esophagitis    Occult GI bleeding 09/30/2021   Alternating constipation and diarrhea 07/26/2020   Gastroesophageal reflux disease 07/26/2020   Dark stools 07/26/2020   Pacemaker battery depletion    Dyspnea 06/17/2020   Chest pain 06/17/2020   Right sided abdominal pain 01/24/2020   Finger dislocation, initial encounter 11/16/2018   Hand trauma, left, initial encounter 11/16/2018   Acute blood loss anemia 10/25/2018   Rectal bleeding 10/23/2018   Acute GI  bleeding    Enteritis of small intestine due to enterotoxigenic Escherichia coli associated with diarrhea 03/24/2017   Bipolar affective disorder ([place]) 03/23/2017   Olfactory hallucinations 03/23/2017   Lower GI bleed    Glaucoma 02/28/2017   Pacemaker 03/21/2015   Non-ischemic cardiomyopathy ([place]) 11/28/2014   PUD (peptic ulcer disease) 06/14/2014   History of bipolar disorder    Hypokalemia 05/26/2014   Fall at home 05/26/2014   HFpEF (heart failure with preverved EF) 11/15/2013   Complete heart block ([place]) 11/15/2013   Inability to ambulate due to ankle or foot 09/05/2013   Unspecified constipation 08/26/2013   Anemia 08/25/2013   Acute diverticulitis 08/24/2013   CKD (chronic kidney disease) stage 3, GFR 30-59 ml/min (HCC) 10/12/2012   Chronic anticoagulation 10/12/2012   Osteoarthritis    Permanent atrial fibrillation    Biventricular cardiac pacemaker - [employer] Consulta    Fibromyalgia    Essential hypertension    Depression    PCP:  Curlene Labrum, MD Pharmacy:   [facility] [place] [address] Phone: [phone] Fax: [phone]  [facility] ([place] [facility] [address] [place] [address] [place] [address] Phone: [phone] Fax: [phone]  [facility] ([facility] ) - [place], [place] [address] [place] [address] Phone: [phone] Fax: [phone]     Social Determinants of Health (SDOH) Interventions    Readmission Risk Interventions     View : No data to display.

## 2021-10-02 ENCOUNTER — Encounter (HOSPITAL_COMMUNITY): Payer: Self-pay | Admitting: Internal Medicine

## 2021-10-02 DIAGNOSIS — R195 Other fecal abnormalities: Secondary | ICD-10-CM | POA: Diagnosis not present

## 2021-10-02 LAB — BASIC METABOLIC PANEL
Anion gap: 5 (ref 5–15)
BUN: 41 mg/dL — ABNORMAL HIGH (ref 8–23)
CO2: 26 mmol/L (ref 22–32)
Calcium: 7.4 mg/dL — ABNORMAL LOW (ref 8.9–10.3)
Chloride: 105 mmol/L (ref 98–111)
Creatinine, Ser: 1.89 mg/dL — ABNORMAL HIGH (ref 0.44–1.00)
GFR, Estimated: 25 mL/min — ABNORMAL LOW (ref >60)
Glucose, Bld: 97 mg/dL (ref 70–99)
Potassium: 3.6 mmol/L (ref 3.5–5.1)
Sodium: 136 mmol/L (ref 135–145)

## 2021-10-02 LAB — MAGNESIUM: Magnesium: 1.6 mg/dL — ABNORMAL LOW (ref 1.7–2.4)

## 2021-10-02 MED ORDER — PROCHLORPERAZINE EDISYLATE 10 MG/2ML IJ SOLN
10.0000 mg | Freq: Once | INTRAMUSCULAR | Status: AC
  Administered 2021-10-02: 10 mg via INTRAVENOUS
  Filled 2021-10-02: qty 2

## 2021-10-02 MED ORDER — MAGNESIUM SULFATE 2 GM/50ML IV SOLN
2.0000 g | Freq: Once | INTRAVENOUS | Status: AC
  Administered 2021-10-02: 2 g via INTRAVENOUS
  Filled 2021-10-02: qty 50

## 2021-10-02 MED ORDER — ZOLPIDEM TARTRATE 5 MG PO TABS: 5.0000 mg | ORAL_TABLET | Freq: Every evening | ORAL | Status: DC | PRN

## 2021-10-02 MED ORDER — METOCLOPRAMIDE HCL 5 MG/ML IJ SOLN
10.0000 mg | Freq: Once | INTRAMUSCULAR | Status: AC
Start: 2021-10-02 — End: 2021-10-02
  Administered 2021-10-02: 10 mg via INTRAVENOUS
  Filled 2021-10-02: qty 2

## 2021-10-02 MED ORDER — DIPHENHYDRAMINE HCL 50 MG/ML IJ SOLN
12.5000 mg | Freq: Once | INTRAMUSCULAR | Status: AC
  Administered 2021-10-02: 12.5 mg via INTRAVENOUS
  Filled 2021-10-02: qty 1

## 2021-10-02 MED ORDER — TIZANIDINE HCL 4 MG PO TABS
4.0000 mg | ORAL_TABLET | Freq: Four times a day (QID) | ORAL | Status: DC | PRN
  Administered 2021-10-03: 4 mg via ORAL
  Filled 2021-10-02: qty 1

## 2021-10-02 MED ORDER — MAGNESIUM CHLORIDE 64 MG PO TBEC
2.0000 | DELAYED_RELEASE_TABLET | Freq: Two times a day (BID) | ORAL | Status: DC
  Administered 2021-10-02 – 2021-10-03 (×3): 128 mg via ORAL
  Filled 2021-10-02 (×4): qty 2

## 2021-10-02 NOTE — Progress Notes (Signed)
  Progress Note   Patient: Kimberly [name] JKK:[id number] DOB: [birth year] DOA: 09/30/2021     2 DOS: the patient was seen and examined on 10/02/2021        Brief hospital course: Kimberly [name] is a 88 y.o. F with AF on Eliquis, hx GIB who presented with melena.  Transferred from [facility] to [facility] 5/29 for GI evaluation. Eliquis was held, and EGD performed 5/30 revealing esophagitis with mucosal changes suggestive of Barrett's esophagus, the suspected cause of melena. PPI BID and holding eliquis for a week was recommended.      Assessment and Plan:  Upper GI bleed Likely due to esophagitis.  EGD 5/30 by Dr. Hilarie Fredrickson - Continue PPI BID x8 weeks per GI - Hold eliquis 1 week   Acute blood loss anemia on anemia of CKD Hgb stable    CKD IIIa to IV Cr stable   Atrial fibrillation - Hold Eliqius - Continue Coreg   Headache CT head normal.  Tried PO hydromorphone, acetaminophen, compazine, reglan, and diphenhdyramine.   NSAIDs contraindicated.  Suspect this is MOH headache.    Hypertension - Continue Coreg           Subjective: Stil with severe headache.     Physical Exam: Vitals:   10/02/21 1106 10/02/21 1311 10/02/21 1400 10/02/21 1600  BP: (!) 110/55 (!) 121/46 (!) 114/48 (!) 120/91  Pulse: 64 (!) 59 61 61  Resp: '13 19 20 17  '$ Temp: (!) 97.4 F (36.3 C)   (!) 97.4 F (36.3 C)  TempSrc:    Oral  SpO2: 94% 99% 100% 99%  Weight:      Height:       Elderly female, lying in bed, very uncomfortable RRR no murmurs, no LE edema RR normal, no rales or wheezes     Disposition: Status is: Inpatient         Author: Edwin Dada, MD 10/02/2021 8:03 PM  For on call review [email or web address].

## 2021-10-02 NOTE — TOC Progression Note (Signed)
Transition of Care [facility]) - Progression Note    Patient Details  Name: Kimberly [name] MRN: [id number] Date of Birth: [birth year]  [facility]) CM/SW Contact  Reece Agar, [place] Phone Number: 10/02/2021, 1:14 PM  Clinical Narrative:    CSW spoke with [facility] [place] to confirm that pt can DC to their facility with ALF and not IDL bc pt needs additional support at home. Staff is checking to see if pt is able to afford the ALF if not pt will go to a SNF.   Expected Discharge Plan: Assisted Living Barriers to Discharge: Continued Medical Work up  Expected Discharge Plan and Services Expected Discharge Plan: Assisted Living In-house Referral: Clinical Social Work   Post Acute Care Choice: [place] Living arrangements for the past 2 months: Apartment                                       Social Determinants of Health (SDOH) Interventions    Readmission Risk Interventions     View : No data to display.

## 2021-10-02 NOTE — Progress Notes (Signed)
Physical Therapy Treatment Patient Details Name: Kimberly [name] MRN: [id number] DOB: 12-Jun-1926 Today's Date: 10/02/2021   History of Present Illness Patient is a 88 y/o female who presents on 5/29 with falls and abdominal pain. Found to have GI bleed in setting of anticoagulation. s/p EGD 5/30. PMH includes A-fib on chronic anticogulation, bipolar disorder, CKD, fibromyalgia, HTN, PPM, back surgery, cardiomyopathy.    PT Comments    Patient progressing well towards PT goals. Session focused on gait progression and transfers. Tolerated bed mobility, transfers and gait training with Min guard and use of RW for support. Pt fatigues with mobility needing seated rest break with walking. Sp02 with poor pleth reading throughout but likely >87% on RA throughout activity. Pt plans to move to an ILF vs ALF. If pt discharges the hospital directly to her new ALF, recommend HHPT to follow her at d/c, however if pt plans to return to her house prior to moving to her new facility, recommend SNF to maximize independence and mobility. Will follow.    Recommendations for follow up therapy are one component of a multi-disciplinary discharge planning process, led by the attending physician.  Recommendations may be updated based on patient status, additional functional criteria and insurance authorization.  Follow Up Recommendations  Skilled nursing-short term rehab (<3 hours/day) (vs HHPT pending plan)     Assistance Recommended at Discharge Intermittent Supervision/Assistance  Patient can return home with the following A little help with walking and/or transfers;A little help with bathing/dressing/bathroom;Assistance with cooking/housework;Direct supervision/assist for financial management;Assist for transportation;Help with stairs or ramp for entrance;Direct supervision/assist for medications management   Equipment Recommendations  None recommended by PT    Recommendations for Other Services        Precautions / Restrictions Precautions Precautions: Fall;Other (comment) Precaution Comments: hx of falls, watch 02 Restrictions Weight Bearing Restrictions: No     Mobility  Bed Mobility Overal bed mobility: Needs Assistance Bed Mobility: Rolling, Sidelying to Sit Rolling: Supervision Sidelying to sit: Supervision, HOB elevated       General bed mobility comments: Increased time/effort to get to EOB, use of rail, no assist needed.    Transfers Overall transfer level: Needs assistance Equipment used: Rolling walker (2 wheels) Transfers: Sit to/from Stand Sit to Stand: Min guard           General transfer comment: Min guard for safety. Stood from [employer], from chair x1, transferred to recliner post session for breakfast.  Flexed trunk.    Ambulation/Gait Ambulation/Gait assistance: Min guard Gait Distance (Feet): 15 Feet (x2 bouts) Assistive device: Rolling walker (2 wheels) Gait Pattern/deviations: Step-through pattern, Decreased stride length, Trunk flexed Gait velocity: decreased     General Gait Details: Slow, mostly steady gait with flexed trunk/hips, 1 seated rest break needed. Sp02 with poor reading throughout but mostly >87% on RA.   Stairs             Wheelchair Mobility    Modified Rankin (Stroke Patients Only)       Balance Overall balance assessment: Needs assistance Sitting-balance support: Feet supported, No upper extremity supported Sitting balance-Leahy Scale: Fair     Standing balance support: During functional activity Standing balance-Leahy Scale: Poor Standing balance comment: Requires UE support in standing.                            Cognition Arousal/Alertness: Awake/alert Behavior During Therapy: WFL for tasks assessed/performed Overall Cognitive Status: No family/caregiver present to  determine baseline cognitive functioning                                 General Comments: Pt confused upon  waking up not knowing where she was, needed to be reoriented. Did not know what time of day it was either. Aware she has a GI bleed and vaguely remembers therapists from yesterday however repeating stories and info from prior session. Follows commands well, pleasant.        Exercises      General Comments General comments (skin integrity, edema, etc.): Sp02 with poor pleth reading throughout but likely >87% on RA throughout activity.      Pertinent Vitals/Pain Pain Assessment Pain Assessment: Faces Faces Pain Scale: Hurts a little bit Pain Location: chronic- back/LEs Pain Descriptors / Indicators: Discomfort, Sore Pain Intervention(s): Monitored during session, Repositioned    Home Living                          Prior Function            PT Goals (current goals can now be found in the care plan section) Progress towards PT goals: Progressing toward goals    Frequency    Min 3X/week      PT Plan Current plan remains appropriate    Co-evaluation              AM-PAC PT "6 Clicks" Mobility   Outcome Measure  Help needed turning from your back to your side while in a flat bed without using bedrails?: A Little Help needed moving from lying on your back to sitting on the side of a flat bed without using bedrails?: A Little Help needed moving to and from a bed to a chair (including a wheelchair)?: A Little Help needed standing up from a chair using your arms (e.g., wheelchair or bedside chair)?: A Little Help needed to walk in hospital room?: Total Help needed climbing 3-5 steps with a railing? : A Lot 6 Click Score: 15    End of Session Equipment Utilized During Treatment: Gait belt Activity Tolerance: Patient tolerated treatment well Patient left: in chair;with call bell/phone within reach Nurse Communication: Mobility status PT Visit Diagnosis: Muscle weakness (generalized) (M62.81);Unsteadiness on feet (R26.81);Difficulty in walking, not elsewhere  classified (R26.2);Pain     Time: 5170-0174 PT Time Calculation (min) (ACUTE ONLY): 22 min  Charges:  $Gait Training: 8-22 mins                     Marisa Severin, PT, DPT Acute Rehabilitation Services Secure chat preferred Office [place] 10/02/2021, 10:10 AM

## 2021-10-02 NOTE — Plan of Care (Signed)
  Problem: Nutrition: Goal: Adequate nutrition will be maintained Outcome: Progressing   Problem: Coping: Goal: Level of anxiety will decrease Outcome: Progressing   Problem: Elimination: Goal: Will not experience complications related to urinary retention Outcome: Progressing   Problem: Pain Managment: Goal: General experience of comfort will improve Outcome: Not Progressing

## 2021-10-02 NOTE — Progress Notes (Signed)
Mobility Specialist Progress Note    10/02/21 1524  Mobility  Activity Refused mobility   Pt stated her side is hurting, she is exhausted, and that she feels drunk. RN notified.   Hildred Alamin Mobility Specialist  Primary: 5N M.S. Phone: [phone] Secondary: 6N M.S. Phone: [phone]

## 2021-10-03 MED ORDER — TIZANIDINE HCL 4 MG PO TABS: 4.0000 mg | ORAL_TABLET | Freq: Four times a day (QID) | ORAL | 0 refills | Status: DC | PRN

## 2021-10-03 MED ORDER — ZOLPIDEM TARTRATE 5 MG PO TABS: 5.0000 mg | ORAL_TABLET | Freq: Every day | ORAL | 0 refills | Status: DC

## 2021-10-03 MED ORDER — PANTOPRAZOLE SODIUM 40 MG PO TBEC: 40.0000 mg | DELAYED_RELEASE_TABLET | Freq: Two times a day (BID) | ORAL | Status: DC

## 2021-10-03 NOTE — TOC Progression Note (Signed)
Transition of Care [facility]) - Progression Note    Patient Details  Name: KEELIE ZEMANEK MRN: [id number] Date of Birth: [birth year]  [facility] Care [facility]) CM/SW Contact  Reece Agar, [place] Phone Number: 10/03/2021, 3:02 PM  Clinical Narrative:    Pt is agreeable to go to a SNF so that she does not have to pay out of pocket to go to the ALF. [facility] has been approved and CSW attempted to contact pt DSS social worker to provide update. No answer CSW will try again before pt DC.    Expected Discharge Plan: Assisted Living Barriers to Discharge: Continued Medical Work up  Expected Discharge Plan and Services Expected Discharge Plan: Assisted Living In-house Referral: Clinical Social Work   Post Acute Care Choice: [place] Living arrangements for the past 2 months: Apartment Expected Discharge Date: 10/03/21                                     Social Determinants of Health (SDOH) Interventions    Readmission Risk Interventions     View : No data to display.

## 2021-10-03 NOTE — Progress Notes (Signed)
Attempted to call report to [facility], at [phone] answer, no way to leave message, phone just rings

## 2021-10-03 NOTE — Discharge Summary (Signed)
Physician Discharge Summary   Patient: Kimberly [name] MRN: [id number] DOB: Jul 02, 1926  Admit date:     09/30/2021  Discharge date: 10/03/21  Discharge Physician: Edwin Dada   PCP: Curlene Labrum, MD     Recommendations at discharge:  Follow up with [facility] for GI bleed in 6-8 weeks Follow up with PCP 1 week after discharge from SNF Check Iron studies in 1 week and CBC in 1 week Check BP daily for 1 week and resume Coreg and furosemide if BP allow Resume Eliquis/apixaban 2.5 mg bid on 10/09/21           Hospital Course: Kimberly [name] is an 88 y.o. F with hx Afib on Eliquis, hx PPM, hx prepyloric ulcer and esophagitis, diverticular bleeding, and CKD IV who presented with 4 days abdominal pain and melena.  In the ER, Hgb was 8 g/dL from baseline between 9-10 g/dL.  Started on PPI and transferred for GI evaluation.    Discharge diagnoses:  Upper GI bleeding Acute blood loss anemia on anemia of CKD Due to esophagitis seen at EGD 5/30 by Dr. Hilarie Fredrickson. Hold Eliquis 1 week, resume 6/7 Continue PPI BID for 8 weeks, then July 30 reduce to once daily Follow up with GI in 2 months Avoid NSAIDs Check iron stores     Acute kidney injury on chronic kidney disease stage IV CKD III ruled out.  Patient's baseline is 1.5-1.8 - Given gentle IVF.    Hypokalemia Resolved   Paroxysmal atrial fibrillation With pacemaker Holding Eliquis x1 week   Bipolar disorder   Headache Liikely tension type or medication overuse.  CT head normal.  No improevment with oxycodone, PO hydromorphone, acetaminophen, Compazine, Reglan or benadryl.  NSAIDs contraindicated. - Recmomend head support with a towel - Recommend tizanidine as needed  Essential hypertension          The [facility] Controlled Substances Registry was reviewed for this patient prior to discharge.   Consultants: Gastroentergoloy Procedures performed: Upper endoscopy, CT head   Disposition: Skilled nursing facility Diet recommendation:  Discharge Diet Orders (From admission, onward)     Start     Ordered   10/03/21 0000  Diet - low sodium heart healthy        10/03/21 1501             DISCHARGE MEDICATION: Allergies as of 10/03/2021       Reactions   Ciprofloxacin Other (See Comments)   Possibly caused diarrhea November 2018   Flagyl [metronidazole] Other (See Comments)   Possibly caused diarrhea November 2018   Papaya Derivatives Hives   Iodine Rash, Other (See Comments)   REACTION:If injected,  Rash/irritated skin reaction "welts"   Penicillins Hives   DID THE REACTION INVOLVE: Swelling of the face/tongue/throat, SOB, or low BP? Unknown Sudden or severe rash/hives, skin peeling, or the inside of the mouth or nose? Yes Did it require medical treatment? Yes When did it last happen?    88 or 88 years old   If all above answers are "NO", may proceed with cephalosporin use.   Sulfa Antibiotics Rash        Medication List     STOP taking these medications    apixaban 2.5 MG Tabs tablet Commonly known as: Eliquis   carvedilol 6.25 MG tablet Commonly known as: COREG   furosemide 40 MG tablet Commonly known as: LASIX   meloxicam 7.5 MG tablet Commonly known as: MOBIC  TAKE these medications    acetaminophen 325 MG tablet Commonly known as: TYLENOL Take 325 mg by mouth every 4 (four) hours as needed for headache.   Blink Tears 0.25 % Soln Generic drug: Polyethylene Glycol 400 Apply 1 drop to eye daily.   dicyclomine 10 MG capsule Commonly known as: BENTYL Take 1 capsule (10 mg total) by mouth 3 (three) times daily before meals.   ondansetron 4 MG disintegrating tablet Commonly known as: ZOFRAN-ODT Take 4 mg by mouth every 8 (eight) hours as needed for nausea or vomiting.   pantoprazole 40 MG tablet Commonly known as: PROTONIX Take 1 tablet (40 mg total) by mouth 2 (two) times daily before a meal.   PreserVision  AREDS 2 Caps Take 1 capsule by mouth 2 (two) times daily.   Systane 0.4-0.3 % Soln Generic drug: Polyethyl Glycol-Propyl Glycol Apply 1 drop to eye daily as needed (dry eyes).   tiZANidine 4 MG tablet Commonly known as: ZANAFLEX Take 1 tablet (4 mg total) by mouth every 6 (six) hours as needed (for headaches).   zolpidem 5 MG tablet Commonly known as: AMBIEN Take 1 tablet (5 mg total) by mouth at bedtime. What changed:  medication strength how much to take        Follow-up Information     Burdine, Virgina Evener, MD Follow up.   Specialty: Family Medicine Contact information: [name] [address] [phone]         [facility]. Schedule an appointment as soon as possible for a visit in 2 month(s).   Specialty: Gastroenterology Contact information: [address] [place] [address] [phone]                Discharge Instructions     Diet - low sodium heart healthy   Complete by: As directed    Increase activity slowly   Complete by: As directed    No wound care   Complete by: As directed        Discharge Exam: Filed Weights   10/01/21 0816 10/02/21 0418  Weight: 62 kg 59 kg    General: Pt is alert, awake, not in acute distress Cardiovascular: RRR, nl S1-S2, no murmurs appreciated.   No LE edema.   Respiratory: Normal respiratory rate and rhythm.  CTAB without rales or wheezes. Abdominal: Abdomen soft and non-tender.  No distension or HSM.   Neuro/Psych: Strength symmetric in upper and lower extremities.  Judgment and insight appear normal.   Condition at discharge: good  The results of significant diagnostics from this hospitalization (including imaging, microbiology, ancillary and laboratory) are listed below for reference.   Imaging Studies: CT HEAD WO CONTRAST (5MM)  Result Date: 10/01/2021 CLINICAL DATA:  Headache, sudden, severe.  Pt c/o severe HA eval EXAM: CT HEAD WITHOUT CONTRAST  TECHNIQUE: Contiguous axial images were obtained from the base of the skull through the vertex without intravenous contrast. RADIATION DOSE REDUCTION: This exam was performed according to the departmental dose-optimization program which includes automated exposure control, adjustment of the mA and/or kV according to patient size and/or use of iterative reconstruction technique. COMPARISON:  CT head 09/23/2021 BRAIN: BRAIN Patchy and confluent areas of decreased attenuation are noted throughout the deep and periventricular white matter of the cerebral hemispheres bilaterally, compatible with chronic microvascular ischemic disease. No evidence of large-territorial acute infarction. No parenchymal hemorrhage. No mass lesion. No extra-axial collection. No mass effect or midline shift. No hydrocephalus. Basilar cisterns are patent. Vascular: No  hyperdense vessel. Atherosclerotic calcifications are present within the cavernous internal carotid arteries. Skull: No acute fracture or focal lesion. Sinuses/Orbits: Paranasal sinuses and mastoid air cells are clear. Bilateral lens replacement. Otherwise the orbits are unremarkable. Other: None. IMPRESSION: No acute intracranial abnormality. Electronically Signed   By: Iven Finn M.D.   On: 10/01/2021 21:50   CT HEAD WO CONTRAST (5MM)  Result Date: 09/18/2021 CLINICAL DATA:  Headache, new or worsening (Age >= 50y) EXAM: CT HEAD WITHOUT CONTRAST TECHNIQUE: Contiguous axial images were obtained from the base of the skull through the vertex without intravenous contrast. RADIATION DOSE REDUCTION: This exam was performed according to the departmental dose-optimization program which includes automated exposure control, adjustment of the mA and/or kV according to patient size and/or use of iterative reconstruction technique. COMPARISON:  09/11/2021 and previous FINDINGS: Brain: Diffuse parenchymal atrophy. Patchy areas of hypoattenuation in deep and periventricular white  matter bilaterally. Negative for acute intracranial hemorrhage, mass lesion, acute infarction, midline shift, or mass-effect. Acute infarct may be inapparent on noncontrast CT. Ventricles and sulci symmetric. Vascular: Atherosclerotic and physiologic intracranial calcifications. Skull: Negative for fracture. Sinuses/Orbits: No acute finding. Other: None IMPRESSION: 1. Negative for bleed or other acute intracranial process. 2. Atrophy and nonspecific white matter changes. Electronically Signed   By: Lucrezia Europe M.D.   On: 09/18/2021 05:19   DG Chest Port 1 View  Result Date: 09/19/2021 CLINICAL DATA:  Shortness of breath. EXAM: PORTABLE CHEST 1 VIEW COMPARISON:  Chest x-ray 09/15/2021 FINDINGS: Left-sided ICD/pacemaker again seen. Heart is enlarged, unchanged. The aorta is ectatic with atherosclerotic calcifications. There is stable biapical pleural calcifications likely related to prior asbestos exposure. There is no focal lung infiltrate, pleural effusion or pneumothorax. No acute fractures are seen. IMPRESSION: 1. No acute cardiopulmonary process. 2. Stable cardiomegaly. Electronically Signed   By: Ronney Asters M.D.   On: 09/19/2021 22:25    Microbiology: Results for orders placed or performed during the hospital encounter of 09/30/21  MRSA Next Gen by PCR, Nasal     Status: None   Collection Time: 09/30/21 12:00 PM   Specimen: Nasal Mucosa; Nasal Swab  Result Value Ref Range Status   MRSA by PCR Next Gen NOT DETECTED NOT DETECTED Final    Comment: (NOTE) The GeneXpert MRSA Assay (FDA approved for NASAL specimens only), is one component of a comprehensive MRSA colonization surveillance program. It is not intended to diagnose MRSA infection nor to guide or monitor treatment for MRSA infections. Test performance is not FDA approved in patients less than 31 years old. Performed at [facility], [place] [address], [place], [place] [address]     Labs: CBC: Recent Labs  Lab 09/30/21 1117  09/30/21 1728 10/01/21 0609 10/01/21 1701  WBC 9.5 12.9* 9.9 8.3  HGB 8.0* 8.0* 7.4* 7.5*  HCT 24.9* 24.7* 22.3* 23.7*  MCV 95.0 94.3 91.8 95.2  PLT 180 202 186 034   Basic Metabolic Panel: Recent Labs  Lab 10/01/21 0609 10/02/21 0101  NA 137 136  K 3.0* 3.6  CL 103 105  CO2 25 26  GLUCOSE 90 97  BUN 44* 41*  CREATININE 1.74* 1.89*  CALCIUM 8.0* 7.4*  MG  --  1.6*   Liver Function Tests: No results for input(s): AST, ALT, ALKPHOS, BILITOT, PROT, ALBUMIN in the last 168 hours. CBG: No results for input(s): GLUCAP in the last 168 hours.  Discharge time spent: approximately 40 minutes spent on discharge counseling, evaluation of patient on day of discharge,  and coordination of discharge planning with nursing, social work, pharmacy and case management  Signed: Edwin Dada, MD [facility] 10/03/2021

## 2021-10-03 NOTE — Progress Notes (Signed)
Physical Therapy Treatment Patient Details Name: Kimberly [name] MRN: [id number] DOB: 08-23-1926 Today's Date: 10/03/2021   History of Present Illness Patient is a 88 y/o female who presents on 5/29 with falls and abdominal pain. Found to have GI bleed in setting of anticoagulation. s/p EGD 5/30. Pt c/o headache and dizziness on 6/1 found to be orthostatic with standing. PMH includes A-fib on chronic anticogulation, bipolar disorder, CKD, fibromyalgia, HTN, migraines, PPM, back surgery, cardiomyopathy.    PT Comments    Pt received in supine, agreeable to therapy session with significant encouragement to assess seated/standing tolerance as RN reports pt was dizzy earlier getting up to [facility]. Pt noted to be orthostatic with limited standing tolerance and very low DBP, MD/RN notified and vitals entered into flowsheet. Poor SpO2 signal on each hand when checked, pt initially dyspneic upon return to supine but sx improved within 1-2 mins of pursed-lip breathing and pt refusing O2 via  for comfort. Pt left with BLE elevated due to continued low diastolic BP readings in supine. Will plan to progress standing/gait tolerance next session if pt agreeable. Pt continues to benefit from PT services to progress toward functional mobility goals.   Recommendations for follow up therapy are one component of a multi-disciplinary discharge planning process, led by the attending physician.  Recommendations may be updated based on patient status, additional functional criteria and insurance authorization.  Follow Up Recommendations  Skilled nursing-short term rehab (<3 hours/day) (vs HHPT pending plan)     Assistance Recommended at Discharge Intermittent Supervision/Assistance  Patient can return home with the following A little help with walking and/or transfers;A little help with bathing/dressing/bathroom;Assistance with cooking/housework;Direct supervision/assist for financial management;Assist for transportation;Help  with stairs or ramp for entrance;Direct supervision/assist for medications management   Equipment Recommendations  None recommended by PT    Recommendations for Other Services       Precautions / Restrictions Precautions Precautions: Fall;Other (comment) Precaution Comments: hx of falls, watch 02/BP, orthostatic 6/1 Restrictions Weight Bearing Restrictions: No     Mobility  Bed Mobility Overal bed mobility: Needs Assistance Bed Mobility: Rolling, Sidelying to Sit Rolling: Supervision Sidelying to sit: Min guard, HOB elevated   Sit to supine: Min guard, HOB elevated   General bed mobility comments: Increased time/effort to get to EOB, use of rail, min guard for safety and verbal cues    Transfers Overall transfer level: Needs assistance Equipment used: Rolling walker (2 wheels) Transfers: Sit to/from Stand Sit to Stand: Min guard, Min assist           General transfer comment: Min guard for safety. Stood from EOB x1 to RW, up to [employer] for safety due to evolving orthostatic symptoms, minA for stand>sit back to EOB, pt too dizzy to remain sitting for further BP had to return to supine    Ambulation/Gait               General Gait Details: pt too dizzy and soft BP standing unsafe to attempt   Stairs             Wheelchair Mobility    Modified Rankin (Stroke Patients Only)       Balance Overall balance assessment: Needs assistance Sitting-balance support: Feet supported, No upper extremity supported Sitting balance-Leahy Scale: Fair Sitting balance - Comments: min guard intermittently for safety   Standing balance support: During functional activity Standing balance-Leahy Scale: Poor Standing balance comment: Requires UE support in standing and minA due to symptoms  Cognition Arousal/Alertness: Awake/alert Behavior During Therapy: WFL for tasks assessed/performed Overall Cognitive Status: No  family/caregiver present to determine baseline cognitive functioning                                 General Comments: Pt mildly irritable throughout, c/o headache discomfort and frustration with increased dizziness but following some simple commands with increased time/encouragement. Pt found to be very orthostatic with standing and educated on dizziness as likely symptom of orthostatic hypotension, pt tangential throughout the session and it is unclear whether she understands therapist education.        Exercises      General Comments General comments (skin integrity, edema, etc.): see comments above; orthostatic hypotension limiting progression of mobility; SpO2 with poor signal throughout      Pertinent Vitals/Pain Pain Assessment Pain Assessment: Faces Faces Pain Scale: Hurts even more Pain Location: headache (back of skull) Pain Descriptors / Indicators: Discomfort, Sore, Headache Pain Intervention(s): Monitored during session, Limited activity within patient's tolerance, Repositioned           PT Goals (current goals can now be found in the care plan section) Acute Rehab PT Goals Patient Stated Goal: move to my new place PT Goal Formulation: With patient Time For Goal Achievement: 10/15/21 Progress towards PT goals: Progressing toward goals    Frequency    Min 3X/week      PT Plan Current plan remains appropriate       AM-PAC PT "6 Clicks" Mobility   Outcome Measure  Help needed turning from your back to your side while in a flat bed without using bedrails?: A Little Help needed moving from lying on your back to sitting on the side of a flat bed without using bedrails?: A Little Help needed moving to and from a bed to a chair (including a wheelchair)?: A Little Help needed standing up from a chair using your arms (e.g., wheelchair or bedside chair)?: A Little Help needed to walk in hospital room?: Total Help needed climbing 3-5 steps with a  railing? : Total 6 Click Score: 14    End of Session Equipment Utilized During Treatment:  (pt refusing gait belt and O2 when c/o dyspnea; sx dyspnea resolved once supine) Activity Tolerance: Patient tolerated treatment well Patient left: in bed;with call bell/phone within reach;with bed alarm set;Other (comment) (pt BLE elevated with heels floated and end of bed elevated due to soft BP) Nurse Communication: Mobility status;Other (comment) (soft BP standing and after standing; MD also notified of pt OH with sx) PT Visit Diagnosis: Muscle weakness (generalized) (M62.81);Unsteadiness on feet (R26.81);Difficulty in walking, not elsewhere classified (R26.2);Pain Pain - Right/Left:  (bilateral) Pain - part of body: Leg (headache)     Time: 5176-1607 PT Time Calculation (min) (ACUTE ONLY): 20 min  Charges:  $Therapeutic Activity: 8-22 mins                     Arya Boxley P., PTA Acute Rehabilitation Services Secure Chat Preferred 9a-5:30pm Office: [place] 10/03/2021, 2:15 PM

## 2021-10-03 NOTE — Care Management Important Message (Signed)
Important Message  Patient Details  Name: Kimberly [name] MRN: [id number] Date of Birth: February 25, 1927   Medicare Important Message Given:  Yes     [name] 10/03/2021, 2:00 PM

## 2021-10-03 NOTE — TOC Transition Note (Signed)
Transition of Care [facility]) - CM/SW Discharge Note   Patient Details  Name: Kimberly [name] MRN: [id number] Date of Birth: 10-24-1926  [facility]) CM/SW Contact:  Tresa Endo Phone Number: 10/03/2021, 2:31 PM   Clinical Narrative:    Patient will DC to: [facility] Anticipated DC date: 10/03/2021 Family notified: Pt Social Worker Transport by: Corey Harold   Per MD patient ready for DC to [facility]. RN to call report prior to discharge [phone]). RN, patient, patient's family, and facility notified of DC. Discharge Summary and FL2 sent to facility. DC packet on chart. Ambulance transport requested for patient.   CSW will sign off for now as social work intervention is no longer needed. Please consult [place] again if new needs arise.     Final next level of care: Assisted Living Barriers to Discharge: Continued Medical Work up   Patient Goals and CMS Choice Patient states their goals for this hospitalization and ongoing recovery are:: PT at ALF CMS Medicare.gov Compare Post Acute Care list provided to:: Patient Choice offered to / list presented to : Patient  Discharge Placement                       Discharge Plan and Services In-house Referral: Clinical Social Work   Post Acute Care Choice: [place]                               Social Determinants of Health (SDOH) Interventions     Readmission Risk Interventions     View : No data to display.

## 2021-10-03 NOTE — Progress Notes (Signed)
Mobility Specialist Progress Note    10/03/21 1007  Mobility  Activity Transferred to/from [facility]  Level of Assistance Contact guard assist, steadying assist  Assistive Device Other (Comment) (HHA)  Activity Response Tolerated fair  $Mobility charge 1 Mobility   Pre-Mobility: 111/59 BP  Pt received in bed. C/o dizziness upon sitting up. Left on BSC with call bell in reach to attempt BM.   Hildred Alamin Mobility Specialist  Primary: 5N M.S. Phone: [phone] Secondary: 6N M.S. Phone: [phone]

## 2021-10-19 ENCOUNTER — Other Ambulatory Visit: Payer: Self-pay

## 2021-10-19 ENCOUNTER — Emergency Department (HOSPITAL_COMMUNITY): Payer: Medicare Other

## 2021-10-19 ENCOUNTER — Observation Stay (HOSPITAL_COMMUNITY)
Admission: EM | Admit: 2021-10-19 | Discharge: 2021-10-24 | Disposition: A | Discharge status: Home / Self Care | Payer: Medicare Other | Attending: Internal Medicine | Admitting: Internal Medicine

## 2021-10-19 ENCOUNTER — Encounter (HOSPITAL_COMMUNITY): Payer: Self-pay | Admitting: Emergency Medicine

## 2021-10-19 DIAGNOSIS — G8929 Other chronic pain: Secondary | ICD-10-CM | POA: Insufficient documentation

## 2021-10-19 DIAGNOSIS — R1011 Right upper quadrant pain: Secondary | ICD-10-CM | POA: Insufficient documentation

## 2021-10-19 DIAGNOSIS — F319 Bipolar disorder, unspecified: Secondary | ICD-10-CM | POA: Insufficient documentation

## 2021-10-19 DIAGNOSIS — R8279 Other abnormal findings on microbiological examination of urine: Secondary | ICD-10-CM | POA: Insufficient documentation

## 2021-10-19 DIAGNOSIS — R799 Abnormal finding of blood chemistry, unspecified: Secondary | ICD-10-CM | POA: Diagnosis not present

## 2021-10-19 DIAGNOSIS — I130 Hypertensive heart and chronic kidney disease with heart failure and stage 1 through stage 4 chronic kidney disease, or unspecified chronic kidney disease: Secondary | ICD-10-CM | POA: Diagnosis not present

## 2021-10-19 DIAGNOSIS — B9620 Unspecified Escherichia coli [E. coli] as the cause of diseases classified elsewhere: Secondary | ICD-10-CM | POA: Insufficient documentation

## 2021-10-19 DIAGNOSIS — K2091 Esophagitis, unspecified with bleeding: Secondary | ICD-10-CM | POA: Insufficient documentation

## 2021-10-19 DIAGNOSIS — R41841 Cognitive communication deficit: Secondary | ICD-10-CM | POA: Insufficient documentation

## 2021-10-19 DIAGNOSIS — R5381 Other malaise: Secondary | ICD-10-CM | POA: Insufficient documentation

## 2021-10-19 DIAGNOSIS — D649 Anemia, unspecified: Secondary | ICD-10-CM | POA: Insufficient documentation

## 2021-10-19 DIAGNOSIS — I10 Essential (primary) hypertension: Secondary | ICD-10-CM | POA: Diagnosis present

## 2021-10-19 DIAGNOSIS — Z79899 Other long term (current) drug therapy: Secondary | ICD-10-CM | POA: Insufficient documentation

## 2021-10-19 DIAGNOSIS — Z8249 Family history of ischemic heart disease and other diseases of the circulatory system: Secondary | ICD-10-CM | POA: Insufficient documentation

## 2021-10-19 DIAGNOSIS — I5043 Acute on chronic combined systolic (congestive) and diastolic (congestive) heart failure: Secondary | ICD-10-CM | POA: Insufficient documentation

## 2021-10-19 DIAGNOSIS — I82409 Acute embolism and thrombosis of unspecified deep veins of unspecified lower extremity: Secondary | ICD-10-CM | POA: Diagnosis not present

## 2021-10-19 DIAGNOSIS — Z950 Presence of cardiac pacemaker: Secondary | ICD-10-CM | POA: Diagnosis not present

## 2021-10-19 DIAGNOSIS — R519 Headache, unspecified: Secondary | ICD-10-CM | POA: Insufficient documentation

## 2021-10-19 DIAGNOSIS — I480 Paroxysmal atrial fibrillation: Secondary | ICD-10-CM | POA: Insufficient documentation

## 2021-10-19 DIAGNOSIS — E871 Hypo-osmolality and hyponatremia: Secondary | ICD-10-CM | POA: Diagnosis not present

## 2021-10-19 DIAGNOSIS — I482 Chronic atrial fibrillation, unspecified: Secondary | ICD-10-CM | POA: Insufficient documentation

## 2021-10-19 DIAGNOSIS — Z8679 Personal history of other diseases of the circulatory system: Secondary | ICD-10-CM | POA: Diagnosis not present

## 2021-10-19 DIAGNOSIS — K5730 Diverticulosis of large intestine without perforation or abscess without bleeding: Secondary | ICD-10-CM | POA: Diagnosis not present

## 2021-10-19 DIAGNOSIS — M549 Dorsalgia, unspecified: Secondary | ICD-10-CM | POA: Insufficient documentation

## 2021-10-19 DIAGNOSIS — R1084 Generalized abdominal pain: Secondary | ICD-10-CM | POA: Insufficient documentation

## 2021-10-19 DIAGNOSIS — I82552 Chronic embolism and thrombosis of left peroneal vein: Secondary | ICD-10-CM | POA: Insufficient documentation

## 2021-10-19 DIAGNOSIS — R10812 Left upper quadrant abdominal tenderness: Secondary | ICD-10-CM | POA: Diagnosis not present

## 2021-10-19 DIAGNOSIS — K2081 Other esophagitis with bleeding: Secondary | ICD-10-CM | POA: Insufficient documentation

## 2021-10-19 DIAGNOSIS — N200 Calculus of kidney: Secondary | ICD-10-CM | POA: Insufficient documentation

## 2021-10-19 DIAGNOSIS — R2689 Other abnormalities of gait and mobility: Secondary | ICD-10-CM | POA: Insufficient documentation

## 2021-10-19 DIAGNOSIS — N184 Chronic kidney disease, stage 4 (severe): Secondary | ICD-10-CM | POA: Insufficient documentation

## 2021-10-19 DIAGNOSIS — R600 Localized edema: Secondary | ICD-10-CM | POA: Insufficient documentation

## 2021-10-19 DIAGNOSIS — R109 Unspecified abdominal pain: Secondary | ICD-10-CM

## 2021-10-19 DIAGNOSIS — I5032 Chronic diastolic (congestive) heart failure: Secondary | ICD-10-CM | POA: Insufficient documentation

## 2021-10-19 DIAGNOSIS — R42 Dizziness and giddiness: Secondary | ICD-10-CM | POA: Diagnosis present

## 2021-10-19 DIAGNOSIS — I700 Atherosclerosis of aorta: Secondary | ICD-10-CM | POA: Insufficient documentation

## 2021-10-19 DIAGNOSIS — N390 Urinary tract infection, site not specified: Principal | ICD-10-CM | POA: Insufficient documentation

## 2021-10-19 DIAGNOSIS — R10811 Right upper quadrant abdominal tenderness: Secondary | ICD-10-CM | POA: Insufficient documentation

## 2021-10-19 DIAGNOSIS — Z87891 Personal history of nicotine dependence: Secondary | ICD-10-CM | POA: Diagnosis not present

## 2021-10-19 DIAGNOSIS — Z91041 Radiographic dye allergy status: Secondary | ICD-10-CM | POA: Insufficient documentation

## 2021-10-19 DIAGNOSIS — M6281 Muscle weakness (generalized): Secondary | ICD-10-CM | POA: Insufficient documentation

## 2021-10-19 DIAGNOSIS — Z7901 Long term (current) use of anticoagulants: Secondary | ICD-10-CM | POA: Insufficient documentation

## 2021-10-19 DIAGNOSIS — Z9181 History of falling: Secondary | ICD-10-CM | POA: Insufficient documentation

## 2021-10-19 DIAGNOSIS — R2681 Unsteadiness on feet: Secondary | ICD-10-CM | POA: Insufficient documentation

## 2021-10-19 DIAGNOSIS — R410 Disorientation, unspecified: Secondary | ICD-10-CM | POA: Insufficient documentation

## 2021-10-19 LAB — BASIC METABOLIC PANEL
Anion gap: 10 (ref 5–15)
BUN: 21 mg/dL (ref 8–23)
CO2: 22 mmol/L (ref 22–32)
Calcium: 8.9 mg/dL (ref 8.9–10.3)
Chloride: 96 mmol/L — ABNORMAL LOW (ref 98–111)
Creatinine, Ser: 1 mg/dL (ref 0.44–1.00)
GFR, Estimated: 53 mL/min — ABNORMAL LOW (ref >60)
Glucose, Bld: 109 mg/dL — ABNORMAL HIGH (ref 70–99)
Potassium: 4.8 mmol/L (ref 3.5–5.1)
Sodium: 128 mmol/L — ABNORMAL LOW (ref 135–145)

## 2021-10-19 LAB — CBC
HCT: 30.5 % — ABNORMAL LOW (ref 36.0–46.0)
Hemoglobin: 10 g/dL — ABNORMAL LOW (ref 12.0–15.0)
MCH: 28.7 pg (ref 26.0–34.0)
MCHC: 32.8 g/dL (ref 30.0–36.0)
MCV: 87.6 fL (ref 80.0–100.0)
Platelets: 217 10*3/uL (ref 150–400)
RBC: 3.48 MIL/uL — ABNORMAL LOW (ref 3.87–5.11)
RDW: 20 % — ABNORMAL HIGH (ref 11.5–15.5)
WBC: 4.6 10*3/uL (ref 4.0–10.5)
nRBC: 0 % (ref 0.0–0.2)

## 2021-10-19 MED ORDER — SODIUM CHLORIDE 0.9 % IV BOLUS
250.0000 mL | Freq: Once | INTRAVENOUS | Status: AC
  Administered 2021-10-19: 250 mL via INTRAVENOUS

## 2021-10-19 NOTE — ED Triage Notes (Addendum)
Pt to the ED with complaints of back pain.  Pt is also worried about a possible GI bleed.  Pt is also complaining of dizziness.  Pt was released from rehab on Friday.  Please contact Fredia Beets for pickup or admission status.

## 2021-10-19 NOTE — ED Provider Notes (Addendum)
[facility] Provider Note   CSN: [id number] Arrival date & time: 10/19/21  1828     History  Chief Complaint  Patient presents with   Dizziness    Kimberly [name] is a 88 y.o. female.  Patient presents with multiple complaints.  Patient reports that she is concerned that she might be still having "internal bleeding".  She reports that she was hospitalized a couple of weeks ago and received "2 pints of blood".  She is still experiencing pain in the right side of her abdomen, similar to when she was in the hospital.  Patient reports that she feels weak and dizzy.  She was released from rehab after her hospitalization 2 days ago.  Patient also complaining about some chronic back pain as well as cramps in her feet that have been present for "years".       Home Medications Prior to Admission medications   Medication Sig Start Date End Date Taking? Authorizing Provider  acetaminophen (TYLENOL) 325 MG tablet Take 325 mg by mouth every 4 (four) hours as needed for headache.    [provider]  dicyclomine (BENTYL) 10 MG capsule Take 1 capsule (10 mg total) by mouth 3 (three) times daily before meals. 05/24/21   Triplett, Tammy, PA-C  Multiple Vitamins-Minerals (PRESERVISION AREDS 2) CAPS Take 1 capsule by mouth 2 (two) times daily.     [provider]  ondansetron (ZOFRAN-ODT) 4 MG disintegrating tablet Take 4 mg by mouth every 8 (eight) hours as needed for nausea or vomiting. 09/24/21   [provider]  pantoprazole (PROTONIX) 40 MG tablet Take 1 tablet (40 mg total) by mouth 2 (two) times daily before a meal. 10/03/21   Danford, Suann Larry, MD  Polyethyl Glycol-Propyl Glycol (SYSTANE) 0.4-0.3 % SOLN Apply 1 drop to eye daily as needed (dry eyes).    [provider]  Polyethylene Glycol 400 (BLINK TEARS) 0.25 % SOLN Apply 1 drop to eye daily.    [provider]  tiZANidine (ZANAFLEX) 4 MG tablet Take 1 tablet (4 mg total) by  mouth every 6 (six) hours as needed (for headaches). 10/03/21   Danford, Suann Larry, MD  zolpidem (AMBIEN) 5 MG tablet Take 1 tablet (5 mg total) by mouth at bedtime. 10/03/21   Danford, Suann Larry, MD      Allergies    Ciprofloxacin, Flagyl [metronidazole], Papaya derivatives, Iodine, Penicillins, and Sulfa antibiotics    Review of Systems   Review of Systems  Physical Exam Updated Vital Signs BP (!) 157/66   Pulse 72   Temp 98.6 F (37 C) (Oral)   Resp 18   Ht '5\' 7"'$  (1.702 m)   Wt 59 kg   SpO2 100%   BMI 20.37 kg/m  Physical Exam Vitals and nursing note reviewed.  Constitutional:      General: She is not in acute distress.    Appearance: She is well-developed.  HENT:     Head: Normocephalic and atraumatic.     Mouth/Throat:     Mouth: Mucous membranes are moist.  Eyes:     General: Vision grossly intact. Gaze aligned appropriately.     Extraocular Movements: Extraocular movements intact.     Conjunctiva/sclera: Conjunctivae normal.  Cardiovascular:     Rate and Rhythm: Normal rate and regular rhythm.     Pulses: Normal pulses.     Heart sounds: Normal heart sounds, S1 normal and S2 normal. No murmur heard.    No friction rub. No  gallop.  Pulmonary:     Effort: Pulmonary effort is normal. No respiratory distress.     Breath sounds: Normal breath sounds.  Abdominal:     General: Bowel sounds are normal.     Palpations: Abdomen is soft.     Tenderness: There is abdominal tenderness in the right upper quadrant and right lower quadrant. There is no guarding or rebound.     Hernia: No hernia is present.  Musculoskeletal:        General: No swelling.     Cervical back: Full passive range of motion without pain, normal range of motion and neck supple. No spinous process tenderness or muscular tenderness. Normal range of motion.     Right lower leg: No edema.     Left lower leg: No edema.  Skin:    General: Skin is warm and dry.     Capillary Refill: Capillary refill  takes less than 2 seconds.     Findings: No ecchymosis, erythema, rash or wound.  Neurological:     General: No focal deficit present.     Mental Status: She is alert and oriented to person, place, and time.     GCS: GCS eye subscore is 4. GCS verbal subscore is 5. GCS motor subscore is 6.     Cranial Nerves: Cranial nerves 2-12 are intact.     Sensory: Sensation is intact.     Motor: Motor function is intact.     Coordination: Coordination is intact.  Psychiatric:        Attention and Perception: Attention normal.        Mood and Affect: Mood normal.        Speech: Speech normal.        Behavior: Behavior normal.     ED Results / Procedures / Treatments   Labs (all labs ordered are listed, but only abnormal results are displayed) Labs Reviewed  BASIC METABOLIC PANEL - Abnormal; Notable for the following components:      Result Value   Sodium 128 (*)    Chloride 96 (*)    Glucose, Bld 109 (*)    GFR, Estimated 53 (*)    All other components within normal limits  CBC - Abnormal; Notable for the following components:   RBC 3.48 (*)    Hemoglobin 10.0 (*)    HCT 30.5 (*)    RDW 20.0 (*)    All other components within normal limits  URINALYSIS, ROUTINE W REFLEX MICROSCOPIC - Abnormal; Notable for the following components:   APPearance HAZY (*)    Nitrite POSITIVE (*)    Leukocytes,Ua LARGE (*)    WBC, UA >50 (*)    Bacteria, UA RARE (*)    All other components within normal limits  URINE CULTURE    EKG EKG Interpretation  Date/Time:  Saturday October 19 2021 19:39:20 EDT Ventricular Rate:  61 PR Interval:    QRS Duration: 142 QT Interval:  476 QTC Calculation: 479 R Axis:   -46 Text Interpretation: Electronic ventricular pacemaker Confirmed by Orpah Greek [phone]) on 10/19/2021 10:58:58 PM  Radiology CT ABDOMEN PELVIS WO CONTRAST  Result Date: 10/20/2021 CLINICAL DATA:  Abdominal pain, acute, nonlocalized EXAM: CT ABDOMEN AND PELVIS WITHOUT CONTRAST  TECHNIQUE: Multidetector CT imaging of the abdomen and pelvis was performed following the standard protocol without IV contrast. RADIATION DOSE REDUCTION: This exam was performed according to the departmental dose-optimization program which includes automated exposure control, adjustment of the mA and/or kV according  to patient size and/or use of iterative reconstruction technique. COMPARISON:  None Available. FINDINGS: Lower chest: Left basilar parenchymal scarring, volume loss, and cylindrical bronchiectasis. Extensive coronary artery calcification. Mild cardiomegaly. Pacemaker leads seen within the right heart and left ventricular venous outflow. Moderate hiatal hernia. Hepatobiliary: Status post cholecystectomy. No intra or extrahepatic biliary ductal dilation. Cyst noted within the left hepatic lobe. No definite solid intrahepatic mass identified on this noncontrast examination. Pancreas: Unremarkable Spleen: Unremarkable Adrenals/Urinary Tract: The adrenal glands are unremarkable. The kidneys are normal in size and position. 2-3 mm nonobstructing renal calculi are noted within the lower pole the right kidney. No hydronephrosis. No ureteral calculi. The bladder is unremarkable. Stomach/Bowel: Moderate transverse and severe descending and sigmoid colonic diverticulosis is present without superimposed acute inflammatory change. Moderate stool throughout the colon without evidence of obstruction. The appendix is not visualized and is likely absent. Stomach, small bowel, and large bowel are otherwise unremarkable. No free intraperitoneal gas or fluid. Vascular/Lymphatic: Extensive aortoiliac atherosclerotic calcification no aneurysm. Extensive visceral arteriosclerosis vessel patency, however, is not well assessed on this noncontrast examination. No pathologic adenopathy within the abdomen and pelvis. Reproductive: Status post hysterectomy. No adnexal masses. Other: Tiny broad-based fat containing umbilical hernia.  Rectum unremarkable. Musculoskeletal: Osseous structures are diffusely osteopenic. Moderate lumbar dextroscoliosis. Superimposed advanced degenerative changes are noted throughout the thoracolumbar spine remote appearing superior endplate fracture of X73 noted. No acute bone abnormality. IMPRESSION: 1. No acute intra-abdominal pathology identified. No definite radiographic explanation for the patient's reported symptoms. 2. Minimal right nonobstructing nephrolithiasis. No urolithiasis. No hydronephrosis. 3. Severe distal colonic diverticulosis without superimposed acute inflammatory change. Aortic Atherosclerosis (ICD10-I70.0). Electronically Signed   By: Fidela Salisbury M.D.   On: 10/20/2021 01:59    Procedures Procedures    Medications Ordered in ED Medications  cefTRIAXone (ROCEPHIN) 1 g in sodium chloride 0.9 % 100 mL IVPB (has no administration in time range)  sodium chloride 0.9 % bolus 250 mL (0 mLs Intravenous Stopped 10/19/21 2148)    ED Course/ Medical Decision Making/ A&P                           Medical Decision Making Amount and/or Complexity of Data Reviewed Labs: ordered. Radiology: ordered.   Patient presents to the emergency department with concerns over possible continued bleeding.  She reports that she has been having right-sided abdominal pain since she was hospitalized.  Patient's vital signs are stable.  Hemoglobin is above what it was when she was admitted for transfusion.  No signs of ongoing bleeding.  CT scan performed, does not show any acute abnormality.   Patient with dizziness, unsteadiness on her feet, at least partially secondary to what appears to be acute urinary tract infection.  Urine culture has been ordered.  Initiated Rocephin.  Patient currently in an independent living situation.  Likely will require at least assisted living.  Cannot be discharged with her current generalized weakness and dizziness.       Final Clinical Impression(s) / ED  Diagnoses Final diagnoses:  Abdominal pain, unspecified abdominal location  Urinary tract infection without hematuria, site unspecified    Rx / DC Orders ED Discharge Orders     None         Orpah Greek, MD 10/20/21 [id number]    Orpah Greek, MD 10/20/21 [phone]

## 2021-10-20 DIAGNOSIS — N3000 Acute cystitis without hematuria: Secondary | ICD-10-CM

## 2021-10-20 DIAGNOSIS — N390 Urinary tract infection, site not specified: Secondary | ICD-10-CM | POA: Diagnosis present

## 2021-10-20 LAB — URINALYSIS, ROUTINE W REFLEX MICROSCOPIC
Bilirubin Urine: NEGATIVE
Glucose, UA: NEGATIVE mg/dL
Hgb urine dipstick: NEGATIVE
Ketones, ur: NEGATIVE mg/dL
Nitrite: POSITIVE — AB
Protein, ur: NEGATIVE mg/dL
Specific Gravity, Urine: 1.01 (ref 1.005–1.030)
WBC, UA: >50 WBC/hpf — ABNORMAL HIGH (ref 0–5)
pH: 6 (ref 5.0–8.0)

## 2021-10-20 LAB — OSMOLALITY, URINE: Osmolality, Ur: 264 mOsm/kg — ABNORMAL LOW (ref 300–900)

## 2021-10-20 LAB — TSH: TSH: 3.716 u[IU]/mL (ref 0.350–4.500)

## 2021-10-20 LAB — SODIUM, URINE, RANDOM: Sodium, Ur: 30 mmol/L

## 2021-10-20 LAB — OSMOLALITY: Osmolality: 273 mOsm/kg — ABNORMAL LOW (ref 275–295)

## 2021-10-20 MED ORDER — DICYCLOMINE HCL 10 MG PO CAPS
10.0000 mg | ORAL_CAPSULE | Freq: Three times a day (TID) | ORAL | Status: DC
  Administered 2021-10-20 – 2021-10-24 (×13): 10 mg via ORAL
  Filled 2021-10-20 (×13): qty 1

## 2021-10-20 MED ORDER — ONDANSETRON 4 MG PO TBDP: 4.0000 mg | ORAL_TABLET | Freq: Three times a day (TID) | ORAL | Status: DC | PRN

## 2021-10-20 MED ORDER — ONDANSETRON HCL 4 MG/2ML IJ SOLN: 4.0000 mg | Freq: Four times a day (QID) | INTRAMUSCULAR | Status: DC | PRN

## 2021-10-20 MED ORDER — SODIUM CHLORIDE 0.9 % IV SOLN: INTRAVENOUS | Status: AC

## 2021-10-20 MED ORDER — ACETAMINOPHEN 325 MG PO TABS
650.0000 mg | ORAL_TABLET | Freq: Four times a day (QID) | ORAL | Status: DC | PRN
  Administered 2021-10-20 – 2021-10-22 (×3): 650 mg via ORAL
  Filled 2021-10-20 (×3): qty 2

## 2021-10-20 MED ORDER — OCUVITE-LUTEIN PO CAPS
1.0000 | ORAL_CAPSULE | Freq: Two times a day (BID) | ORAL | Status: DC
  Administered 2021-10-20 – 2021-10-24 (×9): 1 via ORAL
  Filled 2021-10-20 (×9): qty 1

## 2021-10-20 MED ORDER — POLYVINYL ALCOHOL 1.4 % OP SOLN
1.0000 [drp] | Freq: Every day | OPHTHALMIC | Status: DC | PRN
Start: 2021-10-20 — End: 2021-10-24
  Administered 2021-10-22: 1 [drp] via OPHTHALMIC

## 2021-10-20 MED ORDER — ZOLPIDEM TARTRATE 5 MG PO TABS
5.0000 mg | ORAL_TABLET | Freq: Every day | ORAL | Status: DC
Start: 2021-10-20 — End: 2021-10-24
  Administered 2021-10-20 – 2021-10-23 (×4): 5 mg via ORAL
  Filled 2021-10-20 (×4): qty 1

## 2021-10-20 MED ORDER — ACETAMINOPHEN 650 MG RE SUPP: 650.0000 mg | Freq: Four times a day (QID) | RECTAL | Status: DC | PRN

## 2021-10-20 MED ORDER — SODIUM CHLORIDE 0.9 % IV SOLN
1.0000 g | Freq: Once | INTRAVENOUS | Status: AC
  Administered 2021-10-20: 1 g via INTRAVENOUS
  Filled 2021-10-20: qty 10

## 2021-10-20 MED ORDER — ACETAMINOPHEN 325 MG PO TABS: 325.0000 mg | ORAL_TABLET | ORAL | Status: DC | PRN

## 2021-10-20 MED ORDER — APIXABAN 2.5 MG PO TABS
2.5000 mg | ORAL_TABLET | Freq: Two times a day (BID) | ORAL | Status: DC
  Administered 2021-10-20 – 2021-10-24 (×9): 2.5 mg via ORAL
  Filled 2021-10-20 (×9): qty 1

## 2021-10-20 MED ORDER — PANTOPRAZOLE SODIUM 40 MG PO TBEC
40.0000 mg | DELAYED_RELEASE_TABLET | Freq: Two times a day (BID) | ORAL | Status: DC
  Administered 2021-10-20 – 2021-10-24 (×9): 40 mg via ORAL
  Filled 2021-10-20 (×10): qty 1

## 2021-10-20 MED ORDER — TIZANIDINE HCL 4 MG PO TABS: 4.0000 mg | ORAL_TABLET | Freq: Four times a day (QID) | ORAL | Status: DC | PRN

## 2021-10-20 MED ORDER — CARVEDILOL 3.125 MG PO TABS
6.2500 mg | ORAL_TABLET | Freq: Two times a day (BID) | ORAL | Status: DC
  Administered 2021-10-20 – 2021-10-24 (×9): 6.25 mg via ORAL
  Filled 2021-10-20 (×9): qty 2

## 2021-10-20 MED ORDER — POLYVINYL ALCOHOL 1.4 % OP SOLN
1.0000 [drp] | Freq: Every day | OPHTHALMIC | Status: DC
Start: 2021-10-20 — End: 2021-10-24
  Administered 2021-10-20 – 2021-10-24 (×5): 1 [drp] via OPHTHALMIC
  Filled 2021-10-20 (×3): qty 15

## 2021-10-20 MED ORDER — ONDANSETRON HCL 4 MG PO TABS: 4.0000 mg | ORAL_TABLET | Freq: Four times a day (QID) | ORAL | Status: DC | PRN

## 2021-10-20 MED ORDER — SODIUM CHLORIDE 0.9 % IV SOLN
1.0000 g | INTRAVENOUS | Status: DC
  Administered 2021-10-21: 1 g via INTRAVENOUS
  Filled 2021-10-20: qty 10

## 2021-10-20 MED ORDER — HYDRALAZINE HCL 20 MG/ML IJ SOLN: 10.0000 mg | INTRAMUSCULAR | Status: DC | PRN

## 2021-10-20 NOTE — H&P (Signed)
History and Physical    Kimberly [name] QXI:[id number] DOB: 04-25-1927 DOA: 10/19/2021  PCP: Curlene Labrum, MD   Patient coming from: Home  Chief Complaint: Melton Alar  HPI: Kimberly [name] is a 88 y.o. female with medical history significant for atrial fibrillation on Eliquis, pacemaker placement, bipolar disorder, stage IV CKD, hypertension, and recent admission for GI bleed with esophagitis and discharged on 6/1 who was recently discharged from SNF/rehab and presented with worsening weakness and dizziness.  She has also been having ongoing chronic abdominal pain and feet cramping since discharge from her rehab facility just 2 days ago.  She appears to have remained off of her Eliquis and some of her home medications as she was never told to resume them.  She denies any symptoms of dysuria or hematuria.   ED Course: Vital signs are stable and patient afebrile, hemoglobin is 10 and sodium 128.  CT of the abdomen demonstrating right sided nephrolithiasis which is nonobstructing and findings of severe diverticulosis, but no other acute findings noted.  Urine analysis positive for UTI and patient started on Rocephin and given some IV fluid in ED.  Review of Systems: Reviewed as noted above, otherwise negative.  Past Medical History:  Diagnosis Date   Acute blood loss anemia 10/11/2012   Acute diverticulitis 08/24/2013   Acute on chronic combined systolic and diastolic CHF, NYHA class 4 (HCC) 11/15/2013   Antral ulcer 10/11/2012   Arrhythmia    atrial fibb   Atrial fibrillation (HCC)    Bipolar affective disorder ([place]) 03/23/2017   Cardiomyopathy, nonischemic (HCC)    Chronic anticoagulation 10/12/2012   CKD (chronic kidney disease) stage 3, GFR 30-59 ml/min ([place]) 10/12/2012   Depression    Erosive esophagitis 10/11/2012   Fibromyalgia    Glaucoma    H/O echocardiogram 2007   EF 40-45%,          Hypertension    Osteoarthritis    Pacemaker    Last saw cards 07/2013   Scoliosis      Past Surgical History:  Procedure Laterality Date   ABDOMINAL HYSTERECTOMY     APPENDECTOMY     BACK SURGERY     BIOPSY  03/03/2017   Procedure: BIOPSY;  Surgeon: Danie Binder, MD;  [facility]: [facility];  Service: Endoscopy;;  gastric   BREAST SURGERY     CARDIAC CATHETERIZATION  12/08/2005   LAD AND LEFT MAIN WITH NO HIGH-GRADE STENOSIS. MILD DISEASE IN THE CX AND LAD SYSTEM. SEVERE LV DYSFUNCTION WITH DILATION OF THE LV. EF 15-20%. LV END-DIASTOLIC PRESSURE IS 90. +1 MR.   CHOLECYSTECTOMY     COLONOSCOPY N/A 03/03/2017   Dr. Oneida Alar: 5 colon polyps removed, adenomatous.  Diverticulosis.  99% of the colon was cleared but the cecum was not adequately seen.   CYSTOSCOPY N/A 02/24/2013   Procedure: CYSTOSCOPY WITH URETHRAL DILITATION;  Surgeon: Marissa Nestle, MD;  Location: [facility];  Service: Urology;  Laterality: N/A;   DOPPLER ECHOCARDIOGRAPHY N/A 05/30/2010   LV SIZE IS NORMAL. LV SYSTOLIC FUNCTION IS LOW NORMAL. EF=50-55%. MILD INFERIOR HYPOKINESIS.MILD TO MODERATE POSTERIOR WALL HYPOKINESIS.PACEMAKER LEAD IN THE RV. LA IS MILDLY DILATED. RA IS MODERATE TO SEVERLY DILATED. PACEMAKER LEAD IN THE RA. MILD CALCICICATION OF THE MV APPARATUS. MODERATE MR. MILD TO MODERATE TR. MILD PHTN.AV MILDLY SCLEROTIC.   ESOPHAGOGASTRODUODENOSCOPY N/A 10/13/2012   Dr. Gala Romney: severe ulcerative reflux esophagitis, question of Barrett's but negative path, single deep prepyloric antral ulcer, negative H.pylori   ESOPHAGOGASTRODUODENOSCOPY N/A  03/03/2017   Dr. Oneida Alar: Gastritis/duodenitis, no H. pylori   ESOPHAGOGASTRODUODENOSCOPY (EGD) WITH PROPOFOL N/A 10/01/2021   Procedure: ESOPHAGOGASTRODUODENOSCOPY (EGD) WITH PROPOFOL;  Surgeon: Jerene Bears, MD;  Location: [facility];  Service: Gastroenterology;  Laterality: N/A;   HERNIA REPAIR     right inguinal hernia and umbilical   LOWETR EXT VENOUS Bilateral 11-08-10   R & L- NO EVIDENCE OF THROMBUS OR THROMBOPHLEBITIS. THERE IS MILD AMOUNT OF  SUBCUTANEOUS EDEMA NOTED WITHIN THE LEFT CALF AND ANKLE. R & L GSV AND SSV- NO VENOUS INSUFF NOTED.   NECK SURGERY     NUCLEAR STRESS TEST N/A 02/13/2009   NORMAL PATTERN OF PERFUSION IN ALL REGIONS. POST STRESS VENTICULAR SIZE IS NORMAL. POST  STESS EF 85%.  NORMAL MYOCARDIAL PERFUSION STUDY.   OPEN REDUCTION INTERNAL FIXATION (ORIF) DISTAL PHALANX Left 11/16/2018   Procedure: MIDDLE FINGER OPEN REDUCTION VERSUS RECONSTRUCTION;  Surgeon: Roseanne Kaufman, MD;  Location: [facility];  Service: Orthopedics;  Laterality: Left;   PACEMAKER INSERTION     POLYPECTOMY  03/03/2017   Procedure: POLYPECTOMY;  Surgeon: Danie Binder, MD;  [facility];  Service: Endoscopy;;  colon   PPM GENERATOR CHANGEOUT N/A 06/18/2020   Procedure: PPM GENERATOR CHANGEOUT;  Surgeon: Sanda Klein, MD;  Location: [facility];  Service: Cardiovascular;  Laterality: N/A;   TONSILLECTOMY     YAG LASER APPLICATION Bilateral 2/53/6644   Procedure: YAG LASER APPLICATION;  Surgeon: Williams Che, MD;  Location: [facility];  Service: Ophthalmology;  Laterality: Bilateral;     reports that she quit smoking about 16 years ago. Her smoking use included cigarettes. She has a 15.00 pack-year smoking history. She has never used smokeless tobacco. She reports that she does not currently use alcohol. She reports that she does not use drugs.  Allergies  Allergen Reactions   Ciprofloxacin Other (See Comments)    Possibly caused diarrhea November 2018   Flagyl [Metronidazole] Other (See Comments)    Possibly caused diarrhea November 2018   Papaya Derivatives Hives   Iodine Rash and Other (See Comments)    REACTION:If injected,  Rash/irritated skin reaction "welts"   Penicillins Hives    DID THE REACTION INVOLVE: Swelling of the face/tongue/throat, SOB, or low BP? Unknown Sudden or severe rash/hives, skin peeling, or the inside of the mouth or nose? Yes Did it require medical treatment? Yes When did it last happen?     18 or 88 years old   If all above answers are "NO", may proceed with cephalosporin use.   Sulfa Antibiotics Rash    Family History  Problem Relation Age of Onset   Cancer Sister    Asthma Sister    Heart failure Brother    Pulmonary embolism Brother    Cancer Brother    Colon cancer Neg Hx     Prior to Admission medications   Medication Sig Start Date End Date Taking? Authorizing Provider  acetaminophen (TYLENOL) 325 MG tablet Take 325 mg by mouth every 4 (four) hours as needed for headache.    [provider]  dicyclomine (BENTYL) 10 MG capsule Take 1 capsule (10 mg total) by mouth 3 (three) times daily before meals. 05/24/21   Triplett, Tammy, PA-C  Multiple Vitamins-Minerals (PRESERVISION AREDS 2) CAPS Take 1 capsule by mouth 2 (two) times daily.     [provider]  ondansetron (ZOFRAN-ODT) 4 MG disintegrating tablet Take 4 mg by mouth every 8 (eight) hours as needed for nausea or  vomiting. 09/24/21   [provider]  pantoprazole (PROTONIX) 40 MG tablet Take 1 tablet (40 mg total) by mouth 2 (two) times daily before a meal. 10/03/21   Danford, Suann Larry, MD  Polyethyl Glycol-Propyl Glycol (SYSTANE) 0.4-0.3 % SOLN Apply 1 drop to eye daily as needed (dry eyes).    [provider]  Polyethylene Glycol 400 (BLINK TEARS) 0.25 % SOLN Apply 1 drop to eye daily.    [provider]  tiZANidine (ZANAFLEX) 4 MG tablet Take 1 tablet (4 mg total) by mouth every 6 (six) hours as needed (for headaches). 10/03/21   Danford, Suann Larry, MD  zolpidem (AMBIEN) 5 MG tablet Take 1 tablet (5 mg total) by mouth at bedtime. 10/03/21   Edwin Dada, MD    Physical Exam: Vitals:   10/20/21 0352 10/20/21 0658 10/20/21 0700 10/20/21 0730  BP: 137/66 (!) 157/66 (!) 164/75 (!) 167/147  Pulse: 61 72 (!) 59 (!) 59  Resp: 18 18    Temp: 97.9 F (36.6 C) 98.6 F (37 C)    TempSrc: Oral Oral    SpO2: 99% 100% 100% 100%  Weight:      Height:         Constitutional: NAD, calm, comfortable Vitals:   10/20/21 0352 10/20/21 0658 10/20/21 0700 10/20/21 0730  BP: 137/66 (!) 157/66 (!) 164/75 (!) 167/147  Pulse: 61 72 (!) 59 (!) 59  Resp: 18 18    Temp: 97.9 F (36.6 C) 98.6 F (37 C)    TempSrc: Oral Oral    SpO2: 99% 100% 100% 100%  Weight:      Height:       Eyes: lids and conjunctivae normal Neck: normal, supple Respiratory: clear to auscultation bilaterally. Normal respiratory effort. No accessory muscle use.  Cardiovascular: Regular rate and rhythm, no murmurs. Abdomen: no tenderness, no distention. Bowel sounds positive.  Musculoskeletal:  No edema. Skin: no rashes, lesions, ulcers.  Psychiatric: Flat affect  Labs on Admission: I have personally reviewed following labs and imaging studies  CBC: Recent Labs  Lab 10/19/21 2034  WBC 4.6  HGB 10.0*  HCT 30.5*  MCV 87.6  PLT 182   Basic Metabolic Panel: Recent Labs  Lab 10/19/21 2034  NA 128*  K 4.8  CL 96*  CO2 22  GLUCOSE 109*  BUN 21  CREATININE 1.00  CALCIUM 8.9   GFR: Estimated Creatinine Clearance: 33.4 mL/min (by C-G formula based on SCr of 1 mg/dL). Liver Function Tests: No results for input(s): "AST", "ALT", "ALKPHOS", "BILITOT", "PROT", "ALBUMIN" in the last 168 hours. No results for input(s): "LIPASE", "AMYLASE" in the last 168 hours. No results for input(s): "AMMONIA" in the last 168 hours. Coagulation Profile: No results for input(s): "INR", "PROTIME" in the last 168 hours. Cardiac Enzymes: No results for input(s): "CKTOTAL", "CKMB", "CKMBINDEX", "TROPONINI" in the last 168 hours. BNP (last 3 results) No results for input(s): "PROBNP" in the last 8760 hours. HbA1C: No results for input(s): "HGBA1C" in the last 72 hours. CBG: No results for input(s): "GLUCAP" in the last 168 hours. Lipid Profile: No results for input(s): "CHOL", "HDL", "LDLCALC", "TRIG", "CHOLHDL", "LDLDIRECT" in the last 72 hours. Thyroid Function Tests: No results  for input(s): "TSH", "T4TOTAL", "FREET4", "T3FREE", "THYROIDAB" in the last 72 hours. Anemia Panel: No results for input(s): "VITAMINB12", "FOLATE", "FERRITIN", "TIBC", "IRON", "RETICCTPCT" in the last 72 hours. Urine analysis:    Component Value Date/Time   COLORURINE YELLOW 10/20/2021 0115   APPEARANCEUR HAZY (A) 10/20/2021  0115   LABSPEC 1.010 10/20/2021 0115   PHURINE 6.0 10/20/2021 0115   GLUCOSEU NEGATIVE 10/20/2021 0115   HGBUR NEGATIVE 10/20/2021 0115   BILIRUBINUR NEGATIVE 10/20/2021 0115   BILIRUBINUR neg 04/25/2014 0855   KETONESUR NEGATIVE 10/20/2021 0115   PROTEINUR NEGATIVE 10/20/2021 0115   UROBILINOGEN 0.2 05/26/2014 1415   NITRITE POSITIVE (A) 10/20/2021 0115   LEUKOCYTESUR LARGE (A) 10/20/2021 0115    Radiological Exams on Admission: CT ABDOMEN PELVIS WO CONTRAST  Result Date: 10/20/2021 CLINICAL DATA:  Abdominal pain, acute, nonlocalized EXAM: CT ABDOMEN AND PELVIS WITHOUT CONTRAST TECHNIQUE: Multidetector CT imaging of the abdomen and pelvis was performed following the standard protocol without IV contrast. RADIATION DOSE REDUCTION: This exam was performed according to the departmental dose-optimization program which includes automated exposure control, adjustment of the mA and/or kV according to patient size and/or use of iterative reconstruction technique. COMPARISON:  None Available. FINDINGS: Lower chest: Left basilar parenchymal scarring, volume loss, and cylindrical bronchiectasis. Extensive coronary artery calcification. Mild cardiomegaly. Pacemaker leads seen within the right heart and left ventricular venous outflow. Moderate hiatal hernia. Hepatobiliary: Status post cholecystectomy. No intra or extrahepatic biliary ductal dilation. Cyst noted within the left hepatic lobe. No definite solid intrahepatic mass identified on this noncontrast examination. Pancreas: Unremarkable Spleen: Unremarkable Adrenals/Urinary Tract: The adrenal glands are unremarkable. The  kidneys are normal in size and position. 2-3 mm nonobstructing renal calculi are noted within the lower pole the right kidney. No hydronephrosis. No ureteral calculi. The bladder is unremarkable. Stomach/Bowel: Moderate transverse and severe descending and sigmoid colonic diverticulosis is present without superimposed acute inflammatory change. Moderate stool throughout the colon without evidence of obstruction. The appendix is not visualized and is likely absent. Stomach, small bowel, and large bowel are otherwise unremarkable. No free intraperitoneal gas or fluid. Vascular/Lymphatic: Extensive aortoiliac atherosclerotic calcification no aneurysm. Extensive visceral arteriosclerosis vessel patency, however, is not well assessed on this noncontrast examination. No pathologic adenopathy within the abdomen and pelvis. Reproductive: Status post hysterectomy. No adnexal masses. Other: Tiny broad-based fat containing umbilical hernia. Rectum unremarkable. Musculoskeletal: Osseous structures are diffusely osteopenic. Moderate lumbar dextroscoliosis. Superimposed advanced degenerative changes are noted throughout the thoracolumbar spine remote appearing superior endplate fracture of I37 noted. No acute bone abnormality. IMPRESSION: 1. No acute intra-abdominal pathology identified. No definite radiographic explanation for the patient's reported symptoms. 2. Minimal right nonobstructing nephrolithiasis. No urolithiasis. No hydronephrosis. 3. Severe distal colonic diverticulosis without superimposed acute inflammatory change. Aortic Atherosclerosis (ICD10-I70.0). Electronically Signed   By: Fidela Salisbury M.D.   On: 10/20/2021 01:59    EKG: Independently reviewed.  Paced rhythm 61 bpm  Assessment/Plan Principal Problem:   UTI (urinary tract infection) Active Problems:   Biventricular cardiac pacemaker - [employer] Consulta   Essential hypertension   Anemia   Bipolar affective disorder (HCC)   Right sided abdominal  pain    Symptomatic hyponatremia -With generalized weakness and debility, PT/OT evaluation -Replete with IV normal saline -Check urine and serum osmolarity and TSH -Follow labs -Likely need for placement to ALF, appreciate TOC  UTI -Continue Rocephin with urine cultures pending  Recent upper GI bleed with esophagitis -Continue PPI  CKD stage IV -Creatinine currently at baseline  Paroxysmal atrial fibrillation with pacemaker -Continue anticoagulation with Eliquis  Bipolar disorder  Essential hypertension -Currently elevated -Resume home Coreg -Hydralazine as needed   DVT prophylaxis: Eliquis Code Status: Full Family Communication: None at bedside Disposition Plan: Treatment of UTI and hyponatremia Consults called: None Admission status: Observation, telemetry  Severity  of Illness: The appropriate patient status for this patient is OBSERVATION. Observation status is judged to be reasonable and necessary in order to provide the required intensity of service to ensure the patient's safety. The patient's presenting symptoms, physical exam findings, and initial radiographic and laboratory data in the context of their medical condition is felt to place them at decreased risk for further clinical deterioration. Furthermore, it is anticipated that the patient will be medically stable for discharge from the hospital within 2 midnights of admission.    Arhianna Ebey D Manuella Ghazi DO [facility]  If 7PM-7AM, please contact night-coverage [email or web address]  10/20/2021, 7:51 AM

## 2021-10-20 NOTE — Progress Notes (Signed)
Pt refused telemetry on dayshift. Once in bed attempted to reapply. Pt removed again. Pt said I'm not wearing that all the time. Pt refusing telemetry again at this time.

## 2021-10-20 NOTE — ED Notes (Signed)
Kimberly [name] informed of pt being admitted and told nurse there was no need to call [facility] because they don't have staff to tell.

## 2021-10-21 ENCOUNTER — Other Ambulatory Visit: Payer: Self-pay | Admitting: Cardiovascular Disease

## 2021-10-21 DIAGNOSIS — N3000 Acute cystitis without hematuria: Secondary | ICD-10-CM | POA: Diagnosis not present

## 2021-10-21 LAB — CBC
HCT: 28.8 % — ABNORMAL LOW (ref 36.0–46.0)
Hemoglobin: 9.2 g/dL — ABNORMAL LOW (ref 12.0–15.0)
MCH: 28.6 pg (ref 26.0–34.0)
MCHC: 31.9 g/dL (ref 30.0–36.0)
MCV: 89.4 fL (ref 80.0–100.0)
Platelets: 176 10*3/uL (ref 150–400)
RBC: 3.22 MIL/uL — ABNORMAL LOW (ref 3.87–5.11)
RDW: 20.5 % — ABNORMAL HIGH (ref 11.5–15.5)
WBC: 4.6 10*3/uL (ref 4.0–10.5)
nRBC: 0 % (ref 0.0–0.2)

## 2021-10-21 LAB — BASIC METABOLIC PANEL
Anion gap: 6 (ref 5–15)
BUN: 15 mg/dL (ref 8–23)
CO2: 22 mmol/L (ref 22–32)
Calcium: 8.4 mg/dL — ABNORMAL LOW (ref 8.9–10.3)
Chloride: 103 mmol/L (ref 98–111)
Creatinine, Ser: 0.91 mg/dL (ref 0.44–1.00)
GFR, Estimated: 59 mL/min — ABNORMAL LOW (ref >60)
Glucose, Bld: 79 mg/dL (ref 70–99)
Potassium: 4.4 mmol/L (ref 3.5–5.1)
Sodium: 131 mmol/L — ABNORMAL LOW (ref 135–145)

## 2021-10-21 LAB — MAGNESIUM: Magnesium: 1.9 mg/dL (ref 1.7–2.4)

## 2021-10-21 MED ORDER — FOSFOMYCIN TROMETHAMINE 3 G PO PACK
3.0000 g | PACK | Freq: Once | ORAL | Status: AC
  Administered 2021-10-21: 3 g via ORAL
  Filled 2021-10-21: qty 3

## 2021-10-21 MED ORDER — SODIUM CHLORIDE 1 G PO TABS
1.0000 g | ORAL_TABLET | Freq: Three times a day (TID) | ORAL | Status: DC
  Administered 2021-10-21 – 2021-10-23 (×7): 1 g via ORAL
  Filled 2021-10-21 (×7): qty 1
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
## 2021-10-21 NOTE — Care Management Obs Status (Signed)
MEDICARE OBSERVATION STATUS NOTIFICATION   Patient Details  Name: Kimberly [name] MRN: [id number] Date of Birth: June 08, 1926   Medicare Observation Status Notification Given:  Yes (given to [place] in room)    Tommy Medal 10/21/2021, 12:07 PM

## 2021-10-21 NOTE — Care Management Important Message (Deleted)
Important Message  Patient Details  Name: Kimberly [name] MRN: [id number] Date of Birth: August 04, 1926   Medicare Important Message Given:     Given to [employer] in patient room   Tommy Medal 10/21/2021, 12:05 PM

## 2021-10-21 NOTE — Plan of Care (Signed)
Pt alert to self, person disoriented time and situation. Pt easily agitated at hs. Cursing at staff. Continued to refuse telemetry. Yelling at staff to turn hallway light out. Pt informed unable to turn hallway light out due to safety. Pt cursed at staff and told them to get out of her room.  Problem: Education: Goal: Knowledge of General Education information will improve Description: Including pain rating scale, medication(s)/side effects and non-pharmacologic comfort measures Outcome: Progressing   Problem: Health Behavior/Discharge Planning: Goal: Ability to manage health-related needs will improve Outcome: Progressing   Problem: Clinical Measurements: Goal: Ability to maintain clinical measurements within normal limits will improve Outcome: Progressing Goal: Will remain free from infection Outcome: Progressing Goal: Diagnostic test results will improve Outcome: Progressing Goal: Respiratory complications will improve Outcome: Progressing Goal: Cardiovascular complication will be avoided Outcome: Progressing   Problem: Activity: Goal: Risk for activity intolerance will decrease Outcome: Progressing   Problem: Nutrition: Goal: Adequate nutrition will be maintained Outcome: Progressing   Problem: Coping: Goal: Level of anxiety will decrease Outcome: Progressing   Problem: Elimination: Goal: Will not experience complications related to bowel motility Outcome: Progressing Goal: Will not experience complications related to urinary retention Outcome: Progressing   Problem: Pain Managment: Goal: General experience of comfort will improve Outcome: Progressing   Problem: Safety: Goal: Ability to remain free from injury will improve Outcome: Progressing   Problem: Skin Integrity: Goal: Risk for impaired skin integrity will decrease Outcome: Progressing

## 2021-10-21 NOTE — Evaluation (Signed)
Physical Therapy Evaluation Patient Details Name: Kimberly [name] MRN: [id number] DOB: 30-Apr-1927 Today's Date: 10/21/2021  History of Present Illness  Kimberly [name] is a 88 y.o. female with medical history significant for atrial fibrillation on Eliquis, pacemaker placement, bipolar disorder, stage IV CKD, hypertension, and recent admission for GI bleed with esophagitis and discharged on 6/1 who was recently discharged from SNF/rehab and presented with worsening weakness and dizziness.  She is noted to have some symptomatic hyponatremia as well as UTI.   Clinical Impression  Patient agreeable to participating in PT evaluation today. Nursing reports patient on chair alarm and will get up to walk to the bathroom using RW. Patient in chair at beginning and end of session. Nursing reports patient has been sitting up in chair since breakfast. Patient requiring min guard to supervision for transfers and ambulation using RW. Patient exhibited slow, labored cadence using RW and was limited by fatigue. Patient on room air throughout session. Patient did  complaint of dizziness upon sitting after ambulation but was able to participate in therapeutic exercises after a sitting rest period.  Patient would continue to benefit from skilled physical therapy in current environment and next venue to continue return to prior function and increase strength, endurance, balance, coordination, and functional mobility and gait skills.       Recommendations for follow up therapy are one component of a multi-disciplinary discharge planning process, led by the attending physician.  Recommendations may be updated based on patient status, additional functional criteria and insurance authorization.  Follow Up Recommendations Home health PT (vs HHPT pending plan)    Assistance Recommended at Discharge Intermittent Supervision/Assistance  Patient can return home with the following  A little help with walking and/or transfers;A  little help with bathing/dressing/bathroom;Assistance with cooking/housework;Direct supervision/assist for financial management;Assist for transportation;Help with stairs or ramp for entrance;Direct supervision/assist for medications management    Equipment Recommendations None recommended by PT  Recommendations for Other Services       Functional Status Assessment Patient has had a recent decline in their functional status and demonstrates the ability to make significant improvements in function in a reasonable and predictable amount of time.     Precautions / Restrictions Precautions Precautions: Fall;Other (comment);ICD/Pacemaker Precaution Comments: hx of falls, watch 02/BP, orthostatic 6/1 Restrictions Weight Bearing Restrictions: No      Mobility  Bed Mobility   General bed mobility comments: patient sitting in chair at beginning and end of session    Transfers Overall transfer level: Needs assistance Equipment used: Rolling walker (2 wheels) Transfers: Sit to/from Stand, Bed to chair/wheelchair/BSC Sit to Stand: Min guard, Supervision   Step pivot transfers: Min guard, Supervision       General transfer comment: increased time, min guard to supervision in unfamiliar environment for safety    Ambulation/Gait Ambulation/Gait assistance: Min guard, Supervision Gait Distance (Feet): 32 Feet Assistive device: Rolling walker (2 wheels) Gait Pattern/deviations: Step-to pattern, Decreased step length - right, Decreased step length - left, Decreased stance time - left, Trunk flexed, Wide base of support, Knee flexed in stance - left, Knee flexed in stance - right Gait velocity: decreased     General Gait Details: slow, labored cadence using RW; limited by fatigue; on room air; complaints of dizziness upon sitting  Stairs      Wheelchair Mobility    Modified Rankin (Stroke Patients Only)       Balance Overall balance assessment: Needs  assistance Sitting-balance support: Feet supported, No upper extremity supported Sitting  balance-Leahy Scale: Fair Sitting balance - Comments: min guard intermittently for safety   Standing balance support: During functional activity, Reliant on assistive device for balance Standing balance-Leahy Scale: Fair Standing balance comment: fair with RW and UE support           Pertinent Vitals/Pain Pain Assessment Breathing: normal Negative Vocalization: occasional moan/groan, low speech, negative/disapproving quality Facial Expression: smiling or inexpressive Body Language: relaxed Consolability: no need to console PAINAD Score: 1 Pain Intervention(s): Limited activity within patient's tolerance, Monitored during session, Repositioned    Home Living Family/patient expects to be discharged to:: Private residence Living Arrangements: Alone (sleeping on the couch currently)   Type of Home: House Home Access: Stairs to enter   [employer] of Steps: 1   Home Layout: One level Home Equipment: Conservation officer, nature (2 wheels);BSC/3in1;Wheelchair - manual      Prior Function Prior Level of Function : History of Falls (last six months);Needs assist       Physical Assist : ADLs (physical)   ADLs (physical): IADLs Mobility Comments: household distances with RW       Hand Dominance        Extremity/Trunk Assessment   Upper Extremity Assessment Upper Extremity Assessment: Defer to OT evaluation    Lower Extremity Assessment Lower Extremity Assessment: Generalized weakness RLE Sensation: history of peripheral neuropathy;decreased light touch LLE Sensation: history of peripheral neuropathy;decreased light touch    Cervical / Trunk Assessment Cervical / Trunk Assessment: Other exceptions Cervical / Trunk Exceptions: scoliosis  Communication   Communication: No difficulties  Cognition Arousal/Alertness: Awake/alert Behavior During Therapy: WFL for tasks  assessed/performed Overall Cognitive Status: No family/caregiver present to determine baseline cognitive functioning   General Comments: follows commands, tangential at times, able to be redirected.        General Comments      Exercises General Exercises - Upper Extremity Shoulder Flexion: AROM, Strengthening, Both, 10 reps General Exercises - Lower Extremity Long Arc Quad: AROM, Strengthening, Both, 10 reps, Seated Hip ABduction/ADduction: Strengthening, Both, 10 reps, Seated Hip Flexion/Marching: AROM, Strengthening, Both, 10 reps, Seated Toe Raises: AROM, Strengthening, Both, 10 reps, Seated Heel Raises: AROM, Strengthening, Both, 10 reps, Seated   Assessment/Plan    PT Assessment    PT Problem List Decreased strength;Decreased mobility;Decreased safety awareness;Decreased activity tolerance;Decreased balance;Pain;Impaired sensation       PT Treatment Interventions Therapeutic exercise;Gait training;Balance training;Stair training;Cognitive remediation;Functional mobility training;Therapeutic activities;Patient/family education    PT Goals (Current goals can be found in the Care Plan section)  Acute Rehab PT Goals Patient Stated Goal: Go home PT Goal Formulation: With patient Time For Goal Achievement: 11/04/21 Potential to Achieve Goals: Fair    Frequency Min 3X/week        AM-PAC PT "6 Clicks" Mobility  Outcome Measure Help needed turning from your back to your side while in a flat bed without using bedrails?: A Little Help needed moving from lying on your back to sitting on the side of a flat bed without using bedrails?: A Little Help needed moving to and from a bed to a chair (including a wheelchair)?: A Little Help needed standing up from a chair using your arms (e.g., wheelchair or bedside chair)?: A Little Help needed to walk in hospital room?: A Little Help needed climbing 3-5 steps with a railing? : A Lot 6 Click Score: 17    End of Session Equipment  Utilized During Treatment:  (pt refusing gait belt and O2 when c/o dyspnea; sx dyspnea resolved once supine)  Activity Tolerance: Patient tolerated treatment well;Patient limited by fatigue Patient left: with call bell/phone within reach;Other (comment);in chair;with chair alarm set Nurse Communication: Mobility status;Other (comment) PT Visit Diagnosis: Muscle weakness (generalized) (M62.81);Unsteadiness on feet (R26.81);Difficulty in walking, not elsewhere classified (R26.2);History of falling (Z91.81) Pain - Right/Left:  (bilateral) Pain - part of body: Leg (headache)    Time: 0722-5750 PT Time Calculation (min) (ACUTE ONLY): 28 min   Charges:   PT Evaluation $PT Eval Low Complexity: 1 Low PT Treatments $Therapeutic Exercise: 8-22 mins        Floria Raveling. Hartnett-Rands, MS, PT Per [place]ks [phone]  Pamala Hurry  Hartnett-Rands 10/21/2021, 1:13 PM

## 2021-10-21 NOTE — Progress Notes (Signed)
PROGRESS NOTE    Kimberly [name]  KKX:[id number] DOB: [birth year] DOA: 10/19/2021 PCP: Curlene Labrum, MD   Brief Narrative:    Kimberly [name] is a 88 y.o. female with medical history significant for atrial fibrillation on Eliquis, pacemaker placement, bipolar disorder, stage IV CKD, hypertension, and recent admission for GI bleed with esophagitis and discharged on 6/1 who was recently discharged from SNF/rehab and presented with worsening weakness and dizziness.  She is noted to have some symptomatic hyponatremia as well as UTI.  Assessment & Plan:   Principal Problem:   UTI (urinary tract infection) Active Problems:   Biventricular cardiac pacemaker - Medtronic Consulta   Essential hypertension   Anemia   Bipolar affective disorder (HCC)   Right sided abdominal pain  Assessment and Plan:   Symptomatic hyponatremia-improving -With generalized weakness and debility, PT/OT evaluation -Replete with IV normal saline which will be discontinued today, start salt tablets -TSH 3.7 -No significant finding of SIADH, likely poor p.o. salt intake -Follow labs -Likely need for placement to ALF, appreciate TOC   UTI -DC Rocephin and give dose of fosfomycin -Urine cultures pending   Recent upper GI bleed with esophagitis -Continue PPI   CKD stage IV -Creatinine currently at baseline   Paroxysmal atrial fibrillation with pacemaker -Continue anticoagulation with Eliquis   Bipolar disorder   Essential hypertension -Currently elevated -Resume home Coreg -Hydralazine as needed     DVT prophylaxis: Eliquis Code Status: Full Family Communication: None at bedside Disposition Plan:  Status is: Observation The patient will require care spanning > 2 midnights and should be moved to inpatient because: Need for placement, IV fluid.   Consultants:  None  Procedures:  None  Antimicrobials:  Anti-infectives (From admission, onward)    Start     Dose/Rate Route Frequency  Ordered Stop   10/21/21 1030  fosfomycin (MONUROL) packet 3 g        3 g Oral  Once 10/21/21 0941 10/21/21 1032   10/21/21 0500  cefTRIAXone (ROCEPHIN) 1 g in sodium chloride 0.9 % 100 mL IVPB  Status:  Discontinued        1 g 200 mL/hr over 30 Minutes Intravenous Every 24 hours 10/20/21 0808 10/21/21 0941   10/20/21 0715  cefTRIAXone (ROCEPHIN) 1 g in sodium chloride 0.9 % 100 mL IVPB        1 g 200 mL/hr over 30 Minutes Intravenous  Once 10/20/21 0710 10/20/21 1696       Subjective: Patient seen and evaluated today with no new acute complaints or concerns. No acute concerns or events noted overnight.  Objective: Vitals:   10/20/21 0945 10/20/21 1006 10/20/21 1953 10/21/21 0545  BP: (!) 158/71 (!) 154/81 (!) 143/65 (!) 166/71  Pulse: 62 65 (!) 59 60  Resp: '17 18 16 16  '$ Temp: 98.1 F (36.7 C)  97.6 F (36.4 C) 98.1 F (36.7 C)  TempSrc: Oral  Oral Oral  SpO2: 98% 100% 100% 97%  Weight:      Height:        Intake/Output Summary (Last 24 hours) at 10/21/2021 1049 Last data filed at 10/21/2021 0900 Gross per 24 hour  Intake 895.05 ml  Output 150 ml  Net 745.05 ml   Filed Weights   10/19/21 1932  Weight: 59 kg    Examination:  General exam: Appears calm and comfortable  Respiratory system: Clear to auscultation. Respiratory effort normal. Cardiovascular system: S1 & S2 heard, RRR.  Gastrointestinal system: Abdomen is soft Central  nervous system: Alert and awake Extremities: No edema Skin: No significant lesions noted Psychiatry: Flat affect.    Data Reviewed: I have personally reviewed following labs and imaging studies  CBC: Recent Labs  Lab 10/19/21 2034 10/21/21 0416  WBC 4.6 4.6  HGB 10.0* 9.2*  HCT 30.5* 28.8*  MCV 87.6 89.4  PLT 217 491   Basic Metabolic Panel: Recent Labs  Lab 10/19/21 2034 10/21/21 0416  NA 128* 131*  K 4.8 4.4  CL 96* 103  CO2 22 22  GLUCOSE 109* 79  BUN 21 15  CREATININE 1.00 0.91  CALCIUM 8.9 8.4*  MG  --  1.9    GFR: Estimated Creatinine Clearance: 36.7 mL/min (by C-G formula based on SCr of 0.91 mg/dL). Liver Function Tests: No results for input(s): "AST", "ALT", "ALKPHOS", "BILITOT", "PROT", "ALBUMIN" in the last 168 hours. No results for input(s): "LIPASE", "AMYLASE" in the last 168 hours. No results for input(s): "AMMONIA" in the last 168 hours. Coagulation Profile: No results for input(s): "INR", "PROTIME" in the last 168 hours. Cardiac Enzymes: No results for input(s): "CKTOTAL", "CKMB", "CKMBINDEX", "TROPONINI" in the last 168 hours. BNP (last 3 results) No results for input(s): "PROBNP" in the last 8760 hours. HbA1C: No results for input(s): "HGBA1C" in the last 72 hours. CBG: No results for input(s): "GLUCAP" in the last 168 hours. Lipid Profile: No results for input(s): "CHOL", "HDL", "LDLCALC", "TRIG", "CHOLHDL", "LDLDIRECT" in the last 72 hours. Thyroid Function Tests: Recent Labs    10/20/21 0856  TSH 3.716   Anemia Panel: No results for input(s): "VITAMINB12", "FOLATE", "FERRITIN", "TIBC", "IRON", "RETICCTPCT" in the last 72 hours. Sepsis Labs: No results for input(s): "PROCALCITON", "LATICACIDVEN" in the last 168 hours.  No results found for this or any previous visit (from the past 240 hour(s)).       Radiology Studies: CT ABDOMEN PELVIS WO CONTRAST  Result Date: 10/20/2021 CLINICAL DATA:  Abdominal pain, acute, nonlocalized EXAM: CT ABDOMEN AND PELVIS WITHOUT CONTRAST TECHNIQUE: Multidetector CT imaging of the abdomen and pelvis was performed following the standard protocol without IV contrast. RADIATION DOSE REDUCTION: This exam was performed according to the departmental dose-optimization program which includes automated exposure control, adjustment of the mA and/or kV according to patient size and/or use of iterative reconstruction technique. COMPARISON:  None Available. FINDINGS: Lower chest: Left basilar parenchymal scarring, volume loss, and cylindrical  bronchiectasis. Extensive coronary artery calcification. Mild cardiomegaly. Pacemaker leads seen within the right heart and left ventricular venous outflow. Moderate hiatal hernia. Hepatobiliary: Status post cholecystectomy. No intra or extrahepatic biliary ductal dilation. Cyst noted within the left hepatic lobe. No definite solid intrahepatic mass identified on this noncontrast examination. Pancreas: Unremarkable Spleen: Unremarkable Adrenals/Urinary Tract: The adrenal glands are unremarkable. The kidneys are normal in size and position. 2-3 mm nonobstructing renal calculi are noted within the lower pole the right kidney. No hydronephrosis. No ureteral calculi. The bladder is unremarkable. Stomach/Bowel: Moderate transverse and severe descending and sigmoid colonic diverticulosis is present without superimposed acute inflammatory change. Moderate stool throughout the colon without evidence of obstruction. The appendix is not visualized and is likely absent. Stomach, small bowel, and large bowel are otherwise unremarkable. No free intraperitoneal gas or fluid. Vascular/Lymphatic: Extensive aortoiliac atherosclerotic calcification no aneurysm. Extensive visceral arteriosclerosis vessel patency, however, is not well assessed on this noncontrast examination. No pathologic adenopathy within the abdomen and pelvis. Reproductive: Status post hysterectomy. No adnexal masses. Other: Tiny broad-based fat containing umbilical hernia. Rectum unremarkable. Musculoskeletal: Osseous structures are diffusely  osteopenic. Moderate lumbar dextroscoliosis. Superimposed advanced degenerative changes are noted throughout the thoracolumbar spine remote appearing superior endplate fracture of W09 noted. No acute bone abnormality. IMPRESSION: 1. No acute intra-abdominal pathology identified. No definite radiographic explanation for the patient's reported symptoms. 2. Minimal right nonobstructing nephrolithiasis. No urolithiasis. No  hydronephrosis. 3. Severe distal colonic diverticulosis without superimposed acute inflammatory change. Aortic Atherosclerosis (ICD10-I70.0). Electronically Signed   By: Fidela Salisbury M.D.   On: 10/20/2021 01:59        Scheduled Meds:  apixaban  2.5 mg Oral BID   carvedilol  6.25 mg Oral BID WC   dicyclomine  10 mg Oral TID AC   multivitamin-lutein  1 capsule Oral BID   pantoprazole  40 mg Oral BID AC   polyvinyl alcohol  1 drop Both Eyes Daily   zolpidem  5 mg Oral QHS     LOS: 0 days    Time spent: 35 minutes    Kimberly Engen Darleen Crocker, DO [facility]  If 7PM-7AM, please contact night-coverage [email or web address] 10/21/2021, 10:49 AM

## 2021-10-21 NOTE — Progress Notes (Signed)
This nurse was called to 300 nurse's station by DSS to discuss pt competency. DSS case worker administered competency test to pt, according to case worker she passed. Pt was to go back to [place] to the assistant living side and was agreeable at beginning of conversation/assessment.Pt changed her mind and stated, " I want to go home, I changed my mind." Pt's home is not in livable condition as she has mold and is unsafe according to DSS. Social worker here has been notified via secure chat of above findings.

## 2021-10-21 NOTE — TOC Initial Note (Signed)
Transition of Care [facility]) - Initial/Assessment Note    Patient Details  Name: Kimberly [name] MRN: [id number] Date of Birth: [birth year]  Transition of [facility]) CM/SW Contact:    Shade Flood, [name] Phone Number: 10/21/2021, 4:01 PM  Clinical Narrative:                  Pt admitted from [place] at [facility]. Pt was discharged from [facility] SNF rehab to [facility] apartment. This was arranged with the assistance of DSS. They have been following pt at home for assistance. [facility] admissions team states pt can return there at dc only if she will agree to move to the ALF section due to her needs. At this time, pt is refusing to move into ALF and she states that she wants to return to the home she owns in [place]. This home reportedly has a black mold issue. When pt was last at home, she reportedly would frequently call 911 for assistance in the middle of the night.   TOC met with pt earlier this afternoon to assess. At first, pt appeared oriented though her cognitive deficits became more apparent throughout the conversation. Pt demonstrates fluctuating orientation to place and situation. Pt at one point stated that her money, keys, medications, and wallet were missing from her bag and she requested that this [name] call the police and the bank.  Pt does not have any family members locally that she is in contact with. She has a daughter that lives out of state. DSS staff [name] was here earlier to see pt and pt presented as more oriented at that time. This [name] assisted DSS Supervisor Glennie Hawk in speaking with pt by phone. Per Morey Hummingbird, pt does not appear to have capacity to manage at home at this time. She is turning this into an official APS referral with assessment for guardianship needs. Updated MD. TOC will follow up in AM.  Expected Discharge Plan: Assisted Living Barriers to Discharge: Other (must enter comment) (APS and possible  guardianship)   Patient Goals and CMS Choice        Expected Discharge Plan and Services Expected Discharge Plan: Assisted Living In-house Referral: Clinical Social Work     Living arrangements for the past 2 months: [place]                                      Prior Living Arrangements/Services Living arrangements for the past 2 months: [place] Lives with:: Self Patient language and need for interpreter reviewed:: Yes Do you feel safe going back to the place where you live?: Yes      Need for Family Participation in Patient Care: Yes (Comment) Care giver support system in place?: No (comment) Current home services: DME Criminal Activity/Legal Involvement Pertinent to Current Situation/Hospitalization: No - Comment as needed  Activities of Daily Living Home Assistive Devices/Equipment: Gilford Rile (specify type) ADL Screening (condition at time of admission) Patient's cognitive ability adequate to safely complete daily activities?: Yes Is the patient deaf or have difficulty hearing?: No Does the patient have difficulty seeing, even when wearing glasses/contacts?: No Does the patient have difficulty concentrating, remembering, or making decisions?: Yes Patient able to express need for assistance with ADLs?: Yes Does the patient have difficulty dressing or bathing?: No Independently performs ADLs?: Yes (appropriate for developmental age) Does the patient have difficulty walking or  climbing stairs?: Yes Weakness of Legs: Both Weakness of Arms/Hands: None  Permission Sought/Granted                  Emotional Assessment Appearance:: Appears younger than stated age Attitude/Demeanor/Rapport: Angry, Inconsistent Affect (typically observed): Defensive, Inappropriate Orientation: : Oriented to Self, Fluctuating Orientation (Suspected and/or reported Sundowners) Alcohol / Substance Use: Not Applicable Psych Involvement: No  (comment)  Admission diagnosis:  UTI (urinary tract infection) [N39.0] Abdominal pain, unspecified abdominal location [R10.9] Urinary tract infection without hematuria, site unspecified [N39.0] Patient Active Problem List   Diagnosis Date Noted   UTI (urinary tract infection) 10/20/2021   Acute esophagitis    Occult GI bleeding 09/30/2021   Alternating constipation and diarrhea 07/26/2020   Gastroesophageal reflux disease 07/26/2020   Dark stools 07/26/2020   Pacemaker battery depletion    Dyspnea 06/17/2020   Chest pain 06/17/2020   Right sided abdominal pain 01/24/2020   Finger dislocation, initial encounter 11/16/2018   Hand trauma, left, initial encounter 11/16/2018   Acute blood loss anemia 10/25/2018   Rectal bleeding 10/23/2018   Acute GI bleeding    Enteritis of small intestine due to enterotoxigenic Escherichia coli associated with diarrhea 03/24/2017   Bipolar affective disorder ([place]) 03/23/2017   Olfactory hallucinations 03/23/2017   Lower GI bleed    Glaucoma 02/28/2017   Pacemaker 03/21/2015   Non-ischemic cardiomyopathy ([place]) 11/28/2014   PUD (peptic ulcer disease) 06/14/2014   History of bipolar disorder    Hypokalemia 05/26/2014   Fall at home 05/26/2014   HFpEF (heart failure with preverved EF) 11/15/2013   Complete heart block (HCC) 11/15/2013   Inability to ambulate due to ankle or foot 09/05/2013   Unspecified constipation 08/26/2013   Anemia 08/25/2013   Acute diverticulitis 08/24/2013   CKD (chronic kidney disease) stage 3, GFR 30-59 ml/min (HCC) 10/12/2012   Chronic anticoagulation 10/12/2012   Osteoarthritis    Permanent atrial fibrillation    Biventricular cardiac pacemaker - Medtronic Consulta    Fibromyalgia    Essential hypertension    Depression    PCP:  Curlene Labrum, MD Pharmacy:   [place], [place] [address] Phone: [phone] Fax:  [phone]  [facility]) - [place], [place] - [address] [facility] [address] [place] [address] [place] [address] Phone: [phone] Fax: [phone]  [facility] ([facility] ) - [place], [place] - [address] [place] [address] Phone: [phone] Fax: [phone]     Social Determinants of Health (SDOH) Interventions    Readmission Risk Interventions     No data to display

## 2021-10-21 NOTE — Plan of Care (Signed)
  Problem: Acute Rehab PT Goals(only PT should resolve) Goal: Pt Will Go Supine/Side To Sit Outcome: Progressing Flowsheets (Taken 10/21/2021 1316) Pt will go Supine/Side to Sit: with supervision Goal: Pt Will Go Sit To Supine/Side Outcome: Progressing Flowsheets (Taken 10/21/2021 1316) Pt will go Sit to Supine/Side: with supervision Goal: Patient Will Transfer Sit To/From Stand Outcome: Progressing Flowsheets (Taken 10/21/2021 1316) Patient will transfer sit to/from stand: with supervision Goal: Pt Will Transfer Bed To Chair/Chair To Bed Outcome: Progressing Flowsheets (Taken 10/21/2021 1316) Pt will Transfer Bed to Chair/Chair to Bed: with supervision Goal: Pt Will Perform Standing Balance Or Pre-Gait Outcome: Progressing Flowsheets (Taken 10/21/2021 1316) Pt will perform standing balance or pre-gait:  with min guard assist  1-2 min  with bilateral UE support Goal: Pt Will Ambulate Outcome: Progressing Flowsheets (Taken 10/21/2021 1316) Pt will Ambulate:  50 feet  with min guard assist  with rolling walker   Pamala Hurry D. Hartnett-Rands, MS, PT Per [employer] [phone] 10/21/2021

## 2021-10-22 DIAGNOSIS — I10 Essential (primary) hypertension: Secondary | ICD-10-CM | POA: Diagnosis not present

## 2021-10-22 LAB — CBC
HCT: 27.4 % — ABNORMAL LOW (ref 36.0–46.0)
Hemoglobin: 8.7 g/dL — ABNORMAL LOW (ref 12.0–15.0)
MCH: 28.4 pg (ref 26.0–34.0)
MCHC: 31.8 g/dL (ref 30.0–36.0)
MCV: 89.5 fL (ref 80.0–100.0)
Platelets: 168 10*3/uL (ref 150–400)
RBC: 3.06 MIL/uL — ABNORMAL LOW (ref 3.87–5.11)
RDW: 20.4 % — ABNORMAL HIGH (ref 11.5–15.5)
WBC: 4.4 10*3/uL (ref 4.0–10.5)
nRBC: 0 % (ref 0.0–0.2)

## 2021-10-22 LAB — BASIC METABOLIC PANEL
Anion gap: 4 — ABNORMAL LOW (ref 5–15)
BUN: 13 mg/dL (ref 8–23)
CO2: 23 mmol/L (ref 22–32)
Calcium: 8.4 mg/dL — ABNORMAL LOW (ref 8.9–10.3)
Chloride: 104 mmol/L (ref 98–111)
Creatinine, Ser: 0.86 mg/dL (ref 0.44–1.00)
GFR, Estimated: >60 mL/min (ref >60)
Glucose, Bld: 79 mg/dL (ref 70–99)
Potassium: 4.3 mmol/L (ref 3.5–5.1)
Sodium: 131 mmol/L — ABNORMAL LOW (ref 135–145)

## 2021-10-22 LAB — RETICULOCYTES
Immature Retic Fract: 21.1 % — ABNORMAL HIGH (ref 2.3–15.9)
RBC.: 3.34 MIL/uL — ABNORMAL LOW (ref 3.87–5.11)
Retic Count, Absolute: 56.4 10*3/uL (ref 19.0–186.0)
Retic Ct Pct: 1.7 % (ref 0.4–3.1)

## 2021-10-22 LAB — URINE CULTURE: Culture: >=100000 — AB

## 2021-10-22 LAB — FOLATE: Folate: 8.4 ng/mL (ref >5.9)

## 2021-10-22 LAB — IRON AND TIBC
Iron: 44 ug/dL (ref 28–170)
Saturation Ratios: 11 % (ref 10.4–31.8)
TIBC: 417 ug/dL (ref 250–450)
UIBC: 373 ug/dL

## 2021-10-22 LAB — MAGNESIUM: Magnesium: 1.8 mg/dL (ref 1.7–2.4)

## 2021-10-22 LAB — FERRITIN: Ferritin: 144 ng/mL (ref 11–307)

## 2021-10-22 LAB — VITAMIN B12: Vitamin B-12: 284 pg/mL (ref 180–914)

## 2021-10-22 NOTE — Evaluation (Signed)
Speech Language Pathology Evaluation Patient Details Name: Kimberly [name] MRN: [id number] DOB: Aug 23, 1926 Today's Date: 10/22/2021 Time: 1235-1300 SLP Time Calculation (min) (ACUTE ONLY): 25 min  Problem List:  Patient Active Problem List   Diagnosis Date Noted   UTI (urinary tract infection) 10/20/2021   Acute esophagitis    Occult GI bleeding 09/30/2021   Alternating constipation and diarrhea 07/26/2020   Gastroesophageal reflux disease 07/26/2020   Dark stools 07/26/2020   Pacemaker battery depletion    Dyspnea 06/17/2020   Chest pain 06/17/2020   Right sided abdominal pain 01/24/2020   Finger dislocation, initial encounter 11/16/2018   Hand trauma, left, initial encounter 11/16/2018   Acute blood loss anemia 10/25/2018   Rectal bleeding 10/23/2018   Acute GI bleeding    Enteritis of small intestine due to enterotoxigenic Escherichia coli associated with diarrhea 03/24/2017   Bipolar affective disorder ([place]) 03/23/2017   Olfactory hallucinations 03/23/2017   Lower GI bleed    Glaucoma 02/28/2017   Pacemaker 03/21/2015   Non-ischemic cardiomyopathy ([place]) 11/28/2014   PUD (peptic ulcer disease) 06/14/2014   History of bipolar disorder    Hypokalemia 05/26/2014   Fall at home 05/26/2014   HFpEF (heart failure with preverved EF) 11/15/2013   Complete heart block ([place]) 11/15/2013   Inability to ambulate due to ankle or foot 09/05/2013   Unspecified constipation 08/26/2013   Anemia 08/25/2013   Acute diverticulitis 08/24/2013   CKD (chronic kidney disease) stage 3, GFR 30-59 ml/min (HCC) 10/12/2012   Chronic anticoagulation 10/12/2012   Osteoarthritis    Permanent atrial fibrillation    Biventricular cardiac pacemaker - [employer] Consulta    Fibromyalgia    Essential hypertension    Depression    Past Medical History:  Past Medical History:  Diagnosis Date   Acute blood loss anemia 10/11/2012   Acute diverticulitis 08/24/2013   Acute on chronic combined systolic and  diastolic CHF, NYHA class 4 ([place]) 11/15/2013   Antral ulcer 10/11/2012   Arrhythmia    atrial fibb   Atrial fibrillation (HCC)    Bipolar affective disorder ([place]) 03/23/2017   Cardiomyopathy, nonischemic (HCC)    Chronic anticoagulation 10/12/2012   CKD (chronic kidney disease) stage 3, GFR 30-59 ml/min (HCC) 10/12/2012   Depression    Erosive esophagitis 10/11/2012   Fibromyalgia    Glaucoma    H/O echocardiogram 2007   EF 40-45%,          Hypertension    Osteoarthritis    Pacemaker    Last saw cards 07/2013   Scoliosis    Past Surgical History:  Past Surgical History:  Procedure Laterality Date   ABDOMINAL HYSTERECTOMY     APPENDECTOMY     BACK SURGERY     BIOPSY  03/03/2017   Procedure: BIOPSY;  Surgeon: Danie Binder, MD;  [facility];  Service: Endoscopy;;  gastric   BREAST SURGERY     CARDIAC CATHETERIZATION  12/08/2005   LAD AND LEFT MAIN WITH NO HIGH-GRADE STENOSIS. MILD DISEASE IN THE CX AND LAD SYSTEM. SEVERE LV DYSFUNCTION WITH DILATION OF THE LV. EF 15-20%. LV END-DIASTOLIC PRESSURE IS 90. +1 MR.   CHOLECYSTECTOMY     COLONOSCOPY N/A 03/03/2017   Dr. Oneida Alar: 5 colon polyps removed, adenomatous.  Diverticulosis.  99% of the colon was cleared but the cecum was not adequately seen.   CYSTOSCOPY N/A 02/24/2013   Procedure: CYSTOSCOPY WITH URETHRAL DILITATION;  Surgeon: Marissa Nestle, MD;  Location: [facility];  Service: Urology;  Laterality: N/A;   DOPPLER ECHOCARDIOGRAPHY N/A 05/30/2010   LV SIZE IS NORMAL. LV SYSTOLIC FUNCTION IS LOW NORMAL. EF=50-55%. MILD INFERIOR HYPOKINESIS.MILD TO MODERATE POSTERIOR WALL HYPOKINESIS.PACEMAKER LEAD IN THE RV. LA IS MILDLY DILATED. RA IS MODERATE TO SEVERLY DILATED. PACEMAKER LEAD IN THE RA. MILD CALCICICATION OF THE MV APPARATUS. MODERATE MR. MILD TO MODERATE TR. MILD PHTN.AV MILDLY SCLEROTIC.   ESOPHAGOGASTRODUODENOSCOPY N/A 10/13/2012   Dr. Gala Romney: severe ulcerative reflux esophagitis, question of Barrett's but negative path,  single deep prepyloric antral ulcer, negative H.pylori   ESOPHAGOGASTRODUODENOSCOPY N/A 03/03/2017   Dr. Oneida Alar: Gastritis/duodenitis, no H. pylori   ESOPHAGOGASTRODUODENOSCOPY (EGD) WITH PROPOFOL N/A 10/01/2021   Procedure: ESOPHAGOGASTRODUODENOSCOPY (EGD) WITH PROPOFOL;  Surgeon: Jerene Bears, MD;  Location: [facility];  Service: Gastroenterology;  Laterality: N/A;   HERNIA REPAIR     right inguinal hernia and umbilical   LOWETR EXT VENOUS Bilateral 11-08-10   R & L- NO EVIDENCE OF THROMBUS OR THROMBOPHLEBITIS. THERE IS MILD AMOUNT OF SUBCUTANEOUS EDEMA NOTED WITHIN THE LEFT CALF AND ANKLE. R & L GSV AND SSV- NO VENOUS INSUFF NOTED.   NECK SURGERY     NUCLEAR STRESS TEST N/A 02/13/2009   NORMAL PATTERN OF PERFUSION IN ALL REGIONS. POST STRESS VENTICULAR SIZE IS NORMAL. POST  STESS EF 85%.  NORMAL MYOCARDIAL PERFUSION STUDY.   OPEN REDUCTION INTERNAL FIXATION (ORIF) DISTAL PHALANX Left 11/16/2018   Procedure: MIDDLE FINGER OPEN REDUCTION VERSUS RECONSTRUCTION;  Surgeon: Roseanne Kaufman, MD;  Location: [facility];  Service: Orthopedics;  Laterality: Left;   PACEMAKER INSERTION     POLYPECTOMY  03/03/2017   Procedure: POLYPECTOMY;  Surgeon: Danie Binder, MD;  [facility];  Service: Endoscopy;;  colon   PPM GENERATOR CHANGEOUT N/A 06/18/2020   Procedure: PPM GENERATOR CHANGEOUT;  Surgeon: Sanda Klein, MD;  Location: [facility];  Service: Cardiovascular;  Laterality: N/A;   TONSILLECTOMY     YAG LASER APPLICATION Bilateral 2/77/4128   Procedure: YAG LASER APPLICATION;  Surgeon: Williams Che, MD;  Location: [facility];  Service: Ophthalmology;  Laterality: Bilateral;   HPI:  88 y.o. female with medical history significant for atrial fibrillation on Eliquis, pacemaker placement, bipolar disorder, stage IV CKD, hypertension, and recent admission for GI bleed with esophagitis and discharged on 6/1 who was recently discharged from SNF/rehab and presented with worsening weakness  and dizziness.  She has also been having ongoing chronic abdominal pain and feet cramping since discharge from her rehab facility just 2 days ago.  She appears to have remained off of her Eliquis and some of her home medications as she was never told to resume them. Pt admitted 10/19/21.  SLE generated to assess cognitive function.  Assessment / Plan / Recommendation Clinical Impression  Pt administered the SLUMS ([place] Mental Status Examination) with a score obtained of 24/30 (27/30 considered [facility]) with memory recall primary issue with assessment with recalling specific information from a paragraph (50%) and recalling objects after a time delay without a category cue provided by SLP.  No family available to determine baseline cognitive status, but pt appeared to be aware of deficits and safety precautions such as requiring a walker in home environment.  She was able to discuss detailed information re: sequencing cooking (vegetable soup) with precautions provided without cueing from SLP.  Pt would benefit from intermittent supervision/assistance within home setting, so an ALF may be considered vs returning home alone.  Environmental compensatory memory strategies (ie: calendar, timer, etc.) may  be beneficial for pt to assist with memory deficts.  ST will s/o in acute setting.    SLP Assessment  SLP Recommendation/Assessment: Patient does not need any further Speech Language Pathology Services SLP Visit Diagnosis: Cognitive communication deficit (R41.841)    Recommendations for follow up therapy are one component of a multi-disciplinary discharge planning process, led by the attending physician.  Recommendations may be updated based on patient status, additional functional criteria and insurance authorization.    Follow Up Recommendations  Follow physician's recommendations for discharge plan and follow up therapies    Assistance Recommended at Discharge  Intermittent  Supervision/Assistance  Functional Status Assessment Patient has had a recent decline in their functional status and demonstrates the ability to make significant improvements in function in a reasonable and predictable amount of time.  Frequency and Duration Other (Comment) (Evaluation only)         SLP Evaluation Cognition  Overall Cognitive Status: No family/caregiver present to determine baseline cognitive functioning Arousal/Alertness: Awake/alert Orientation Level: Oriented X4 Year: 2023 Month: June Day of Week: Incorrect Attention: Sustained Sustained Attention: Appears intact Memory: Impaired Memory Impairment: Retrieval deficit;Decreased recall of new information Awareness: Appears intact Problem Solving: Appears intact Safety/Judgment: Appears intact       Comprehension  Auditory Comprehension Overall Auditory Comprehension: Appears within functional limits for tasks assessed Yes/No Questions: Within Functional Limits Commands: Within Functional Limits Conversation: Complex Interfering Components: Working Field seismologist: Dispensing optician Discrimination: Not tested Reading Comprehension Reading Status: Not tested    Expression Expression Primary Mode of Expression: Verbal Verbal Expression Overall Verbal Expression: Appears within functional limits for tasks assessed Initiation: No impairment Level of Generative/Spontaneous Verbalization: Conversation Repetition: No impairment Naming: No impairment Non-Verbal Means of Communication: Not applicable Written Expression Dominant Hand: Right Written Expression: Within Functional Limits   Oral / Motor  Oral Motor/Sensory Function Overall Oral Motor/Sensory Function: Within functional limits Motor Speech Overall Motor Speech: Appears within functional limits for tasks assessed Respiration: Within functional limits Phonation: Normal Resonance: Within  functional limits Articulation: Within functional limitis Intelligibility: Intelligible Motor Planning: Witnin functional limits            Elvina Sidle, M.S., CCC-SLP 10/22/2021, 1:22 PM

## 2021-10-22 NOTE — Evaluation (Signed)
Occupational Therapy Evaluation Patient Details Name: Kimberly [name] MRN: [id number] DOB: 1926-06-03 Today's Date: 10/22/2021   History of Present Illness Kimberly [name] is a 88 y.o. female with medical history significant for atrial fibrillation on Eliquis, pacemaker placement, bipolar disorder, stage IV CKD, hypertension, and recent admission for GI bleed with esophagitis and discharged on 6/1 who was recently discharged from SNF/rehab and presented with worsening weakness and dizziness.  She is noted to have some symptomatic hyponatremia as well as UTI.   Clinical Impression   Pt agreeable to OT evaluation. Pt demonstrates generally weakness with labored movement for bed mobility and transfers, but without need for physical assist. Pt unsteady in standing but at level of more mod I to supervision with RW. Pt demonstrates ability to engage in functional standing ADL's for several minutes leaning on sink. Pt  would benefit from home health services to continue to improve strength and mobility within home environment to ensure highest level of safety possible. Pt is not recommended for further acute OT services and will be discharged to care of nursing staff for remaining length of stay.      Recommendations for follow up therapy are one component of a multi-disciplinary discharge planning process, led by the attending physician.  Recommendations may be updated based on patient status, additional functional criteria and insurance authorization.   Follow Up Recommendations  Home health OT    Assistance Recommended at Discharge PRN  Patient can return home with the following A little help with walking and/or transfers;Help with stairs or ramp for entrance;Assist for transportation    Functional Status Assessment  Patient has had a recent decline in their functional status and demonstrates the ability to make significant improvements in function in a reasonable and predictable amount of time.   Equipment Recommendations  None recommended by OT    Recommendations for Other Services       Precautions / Restrictions Precautions Precautions: Fall;Other (comment);ICD/Pacemaker Precaution Comments: hx of falls, watch 02/BP, orthostatic 6/1 Restrictions Weight Bearing Restrictions: No      Mobility Bed Mobility Overal bed mobility: Needs Assistance Bed Mobility: Supine to Sit, Sit to Supine     Supine to sit: Modified independent (Device/Increase time), HOB elevated Sit to supine: Modified independent (Device/Increase time), HOB elevated   General bed mobility comments: Pt using railing with bed elevated slightly to match pt's bed at home.    Transfers Overall transfer level: Needs assistance Equipment used: Rolling walker (2 wheels) Transfers: Sit to/from Stand, Bed to chair/wheelchair/BSC Sit to Stand: Modified independent (Device/Increase time), Supervision     Step pivot transfers: Modified independent (Device/Increase time), Supervision     General transfer comment: Labored movement with more supervision to mod I assist. No physical assist needed with RW. Pt able to ambulate and stand at sink prior to return to chair.      Balance Overall balance assessment: Needs assistance Sitting-balance support: Feet supported, No upper extremity supported Sitting balance-Leahy Scale: Good Sitting balance - Comments: seated EOB   Standing balance support: During functional activity, Reliant on assistive device for balance Standing balance-Leahy Scale: Fair Standing balance comment: fair with RW and UE support                           ADL either performed or assessed with clinical judgement   ADL Overall ADL's : Needs assistance/impaired     Grooming: Standing;Modified independent;Supervision/safety;Wash/dry face;Wash/dry Nurse, mental health Details (indicate cue type  and reason): Able to stand at sink for >4 minutes while washing face and hands with RW  available.     Lower Body Bathing: Modified independent;Sitting/lateral leans;Supervison/ safety   Upper Body Dressing : Modified independent;Sitting   Lower Body Dressing: Modified independent;Sitting/lateral leans Lower Body Dressing Details (indicate cue type and reason): mild labored movmeent/extended time to don B shoes seated in bed and recliner Toilet Transfer: Modified Independent;Supervision/safety;Rolling walker (2 wheels);Ambulation Toilet Transfer Details (indicate cue type and reason): Simulated chair to EOB with RW Toileting- Clothing Manipulation and Hygiene: Supervision/safety;Sitting/lateral lean;Sit to/from stand       Functional mobility during ADLs: Modified independent;Supervision/safety;Rolling walker (2 wheels) General ADL Comments: Pt generally weak with slow movement but no need for physical assist. Mostly supervision at this time.     Vision Baseline Vision/History: 1 Wears glasses Ability to See in Adequate Light: 1 Impaired Patient Visual Report: No change from baseline Vision Assessment?: No apparent visual deficits                Pertinent Vitals/Pain Pain Assessment Pain Assessment: 0-10 Pain Score: 10-Worst pain ever Pain Location: R abdomnial area Pain Descriptors / Indicators: Stabbing Pain Intervention(s): Limited activity within patient's tolerance, Monitored during session, Repositioned     Hand Dominance Right   Extremity/Trunk Assessment Upper Extremity Assessment Upper Extremity Assessment: Generalized weakness   Lower Extremity Assessment Lower Extremity Assessment: Defer to PT evaluation   Cervical / Trunk Assessment Cervical / Trunk Assessment: Other exceptions Cervical / Trunk Exceptions: scoliosis   Communication Communication Communication: No difficulties   Cognition Arousal/Alertness: Awake/alert Behavior During Therapy: WFL for tasks assessed/performed Overall Cognitive Status: No family/caregiver present to  determine baseline cognitive functioning                                                        Home Living Family/patient expects to be discharged to:: Private residence Living Arrangements: Alone Available Help at Discharge: Other (Comment) (social services assist with grocery shopping) Type of Home: House Home Access: Stairs to enter Technical brewer of Steps: 1 Entrance Stairs-Rails: None Home Layout: One level     Bathroom Shower/Tub: Tub only;Sponge bathes at baseline   Bathroom Toilet: Standard Bathroom Accessibility: No   Home Equipment: Conservation officer, nature (2 wheels);BSC/3in1;Wheelchair - manual   Additional Comments: Pt reports she will be moving soon to new place and [place].      Prior Functioning/Environment Prior Level of Function : History of Falls (last six months);Needs assist           ADLs (physical): IADLs Mobility Comments: household distances with RW ADLs Comments: Pt reports indepndence with ADL's with social services assisting with grocery shopping. Reports that cleaning is more difficult for her.         AM-PAC OT "6 Clicks" Daily Activity     Outcome Measure Help from another person eating meals?: None Help from another person taking care of personal grooming?: A Little Help from another person toileting, which includes using toliet, bedpan, or urinal?: None Help from another person bathing (including washing, rinsing, drying)?: A Little Help from another person to put on and taking off regular upper body clothing?: None Help from another person to put on and taking off regular lower body clothing?: A Little 6 Click Score: 21   End of Session  Equipment Utilized During Treatment: Rolling walker (2 wheels) Nurse Communication:  (nursing staff present in pt's room upon end of session)  Activity Tolerance: Patient tolerated treatment well Patient left: in chair;with call bell/phone within reach;Other (comment)  (nursing staff present)  OT Visit Diagnosis: Other abnormalities of gait and mobility (R26.89);Muscle weakness (generalized) (M62.81);History of falling (Z91.81);Unsteadiness on feet (R26.81);Pain Pain - Right/Left: Right Pain - part of body:  (abdominal area)                Time: 7225-7505 OT Time Calculation (min): 23 min Charges:  OT General Charges $OT Visit: 1 Visit OT Evaluation $OT Eval Low Complexity: 1 Low  Zeric Baranowski OT, MOT   Larey Seat 10/22/2021, 9:57 AM

## 2021-10-22 NOTE — Progress Notes (Signed)
Physical Therapy Treatment Patient Details Name: Kimberly [name] MRN: [id number] DOB: [birth year] Today's Date: 10/22/2021   History of Present Illness Kimberly [name] is a 88 y.o. female with medical history significant for atrial fibrillation on Eliquis, pacemaker placement, bipolar disorder, stage IV CKD, hypertension, and recent admission for GI bleed with esophagitis and discharged on 6/1 who was recently discharged from SNF/rehab and presented with worsening weakness and dizziness.  She is noted to have some symptomatic hyponatremia as well as UTI.    PT Comments    Patient presents seated in chair and agreeable for therapy.  Patient demonstrates good return for transferring to/from bed with labored movement and frequent rest breaks, fair/good return for completing BLE ROM/strengthening exercises while seated at bedside, slight increased endurance/distance for gait training without loss of balance, but unable to walk back to room and had to be be taken back to room in chair due to c/o fatigue.  Patient tolerated sitting up in chair after therapy - nursing staff notified.  Patient will benefit from continued skilled physical therapy in hospital and recommended venue below to increase strength, balance, endurance for safe ADLs and gait.      Recommendations for follow up therapy are one component of a multi-disciplinary discharge planning process, led by the attending physician.  Recommendations may be updated based on patient status, additional functional criteria and insurance authorization.  Follow Up Recommendations  Home health PT     Assistance Recommended at Discharge Intermittent Supervision/Assistance  Patient can return home with the following A little help with walking and/or transfers;A little help with bathing/dressing/bathroom;Help with stairs or ramp for entrance;Assistance with cooking/housework   Equipment Recommendations  None recommended by PT    Recommendations for Other  Services       Precautions / Restrictions Precautions Precautions: Fall Restrictions Weight Bearing Restrictions: No     Mobility  Bed Mobility Overal bed mobility: Modified Independent             General bed mobility comments: demonstrates good return transferring to/from bed    Transfers Overall transfer level: Needs assistance Equipment used: Rolling walker (2 wheels) Transfers: Sit to/from Stand, Bed to chair/wheelchair/BSC Sit to Stand: Modified independent (Device/Increase time), Supervision   Step pivot transfers: Modified independent (Device/Increase time), Supervision       General transfer comment: slightly labored movement without loss of balance    Ambulation/Gait Ambulation/Gait assistance: Min guard Gait Distance (Feet): 45 Feet Assistive device: Rollator (4 wheels) Gait Pattern/deviations: Decreased step length - right, Decreased step length - left, Decreased stride length Gait velocity: decreased     General Gait Details: slow slightly labored cadence without loss of balance, limited mostly due to fatigue and had to rolled back to room in chair   Stairs             Wheelchair Mobility    Modified Rankin (Stroke Patients Only)       Balance Overall balance assessment: Needs assistance Sitting-balance support: Feet supported, No upper extremity supported Sitting balance-Leahy Scale: Good Sitting balance - Comments: seated EOB   Standing balance support: During functional activity, Bilateral upper extremity supported Standing balance-Leahy Scale: Fair Standing balance comment: fair/good using RW                            Cognition Arousal/Alertness: Awake/alert Behavior During Therapy: WFL for tasks assessed/performed Overall Cognitive Status: Within Functional Limits for tasks assessed  Exercises General Exercises - Lower Extremity Long Arc Quad: AROM,  Strengthening, Both, 10 reps, Seated Hip Flexion/Marching: AROM, Strengthening, Both, 10 reps, Seated Toe Raises: AROM, Strengthening, Both, 10 reps, Seated Heel Raises: AROM, Strengthening, Both, 10 reps, Seated    General Comments        Pertinent Vitals/Pain Pain Assessment Pain Assessment: Faces Faces Pain Scale: Hurts little more Pain Location: right lower abdomen Pain Descriptors / Indicators: Discomfort, Sore Pain Intervention(s): Limited activity within patient's tolerance, Monitored during session, Repositioned    Home Living     Available Help at Discharge:  (social services assist with grocery shopping per OT report) Type of Home: House                  Prior Function            PT Goals (current goals can now be found in the care plan section) Acute Rehab PT Goals Patient Stated Goal: return home PT Goal Formulation: With patient Time For Goal Achievement: 11/04/21 Potential to Achieve Goals: Good Progress towards PT goals: Progressing toward goals    Frequency    Min 3X/week      PT Plan Current plan remains appropriate    Co-evaluation              AM-PAC PT "6 Clicks" Mobility   Outcome Measure  Help needed turning from your back to your side while in a flat bed without using bedrails?: None Help needed moving from lying on your back to sitting on the side of a flat bed without using bedrails?: None Help needed moving to and from a bed to a chair (including a wheelchair)?: A Little Help needed standing up from a chair using your arms (e.g., wheelchair or bedside chair)?: A Little Help needed to walk in hospital room?: A Little Help needed climbing 3-5 steps with a railing? : A Lot 6 Click Score: 19    End of Session   Activity Tolerance: Patient tolerated treatment well;Patient limited by fatigue Patient left: in chair;with call bell/phone within reach Nurse Communication: Mobility status PT Visit Diagnosis: Unsteadiness on  feet (R26.81);Other abnormalities of gait and mobility (R26.89);Muscle weakness (generalized) (M62.81)     Time: 1410-1435 PT Time Calculation (min) (ACUTE ONLY): 25 min  Charges:  $Gait Training: 8-22 mins $Therapeutic Exercise: 8-22 mins                     3:40 PM, 10/22/21 Lonell Grandchild, MPT Physical Therapist with [facility] [phone] office [phone] mobile phone

## 2021-10-22 NOTE — Progress Notes (Signed)
PROGRESS NOTE    Kimberly [name]  MEQ:[id number] DOB: [birth year] DOA: 10/19/2021 PCP: Curlene Labrum, MD   Brief Narrative:    Kimberly [name] is a 88 y.o. female with medical history significant for atrial fibrillation on Eliquis, pacemaker placement, bipolar disorder, stage IV CKD, hypertension, and recent admission for GI bleed with esophagitis and discharged on 6/1 who was recently discharged from SNF/rehab and presented with worsening weakness and dizziness.  She is noted to have some symptomatic hyponatremia as well as E Coli UTI for which she has completed treatment with fosfomycin.  She appears to have poor oral intake and has been started on sodium chloride tablets.  Pending placement to ALF.  Assessment & Plan:   Principal Problem:   UTI (urinary tract infection) Active Problems:   Biventricular cardiac pacemaker - Medtronic Consulta   Essential hypertension   Anemia   Bipolar affective disorder (HCC)   Right sided abdominal pain  Assessment and Plan:   Symptomatic hyponatremia-improving -With generalized weakness and debility, PT/OT evaluation -Replete with IV normal saline which will be discontinued today, start salt tablets -TSH 3.7 -No significant finding of SIADH, likely poor p.o. salt intake -Started on sodium chloride tablets -Follow a.m. BMP   E. coli UTI -Completed antibiotic treatment with fosfomycin, noted to be pansensitive   Recent upper GI bleed with esophagitis with worsening anemia -Continue PPI -Continue to follow CBC while on Eliquis, may need to discontinue downtrending -Check anemia panel   CKD stage IV ruled out -GFR greater than 60 at this point -Continue to monitor creatinine which appears to be normalized   Paroxysmal atrial fibrillation with pacemaker -Continue anticoagulation with Eliquis   Bipolar disorder   Essential hypertension -Currently elevated -Continue home Coreg -Hydralazine as needed     DVT prophylaxis:  Eliquis Code Status: Full Family Communication: None at bedside Disposition Plan:   Status is: Observation The patient will require care spanning > 2 midnights and should be moved to inpatient because: Need for placement.  Consultants:  None   Procedures:  None   Antimicrobials:  Anti-infectives (From admission, onward)    Start     Dose/Rate Route Frequency Ordered Stop   10/21/21 1030  fosfomycin (MONUROL) packet 3 g        3 g Oral  Once 10/21/21 0941 10/21/21 1032   10/21/21 0500  cefTRIAXone (ROCEPHIN) 1 g in sodium chloride 0.9 % 100 mL IVPB  Status:  Discontinued        1 g 200 mL/hr over 30 Minutes Intravenous Every 24 hours 10/20/21 0808 10/21/21 0941   10/20/21 0715  cefTRIAXone (ROCEPHIN) 1 g in sodium chloride 0.9 % 100 mL IVPB        1 g 200 mL/hr over 30 Minutes Intravenous  Once 10/20/21 0710 10/20/21 2979      Subjective: Patient seen and evaluated today with no new acute complaints or concerns. No acute concerns or events noted overnight.  Objective: Vitals:   10/21/21 2118 10/22/21 0559 10/22/21 0844 10/22/21 1443  BP: (!) 145/66 (!) 163/80 (!) 152/78 (!) 162/69  Pulse: 62 60 70 63  Resp: '18 18 19 20  '$ Temp: 97.7 F (36.5 C) 97.6 F (36.4 C) (!) 97.5 F (36.4 C) 98 F (36.7 C)  TempSrc: Oral Oral Oral   SpO2: 100% 100% 100% 100%  Weight:      Height:        Intake/Output Summary (Last 24 hours) at 10/22/2021 1700 Last data filed at  10/21/2021 1806 Gross per 24 hour  Intake 120 ml  Output --  Net 120 ml   Filed Weights   10/19/21 1932  Weight: 59 kg    Examination:  General exam: Appears calm and comfortable  Respiratory system: Clear to auscultation. Respiratory effort normal. Cardiovascular system: S1 & S2 heard, RRR.  Gastrointestinal system: Abdomen is soft Central nervous system: Alert and awake Extremities: No edema Skin: No significant lesions noted Psychiatry: Flat affect.    Data Reviewed: I have personally reviewed  following labs and imaging studies  CBC: Recent Labs  Lab 10/19/21 2034 10/21/21 0416 10/22/21 0438  WBC 4.6 4.6 4.4  HGB 10.0* 9.2* 8.7*  HCT 30.5* 28.8* 27.4*  MCV 87.6 89.4 89.5  PLT 217 176 962   Basic Metabolic Panel: Recent Labs  Lab 10/19/21 2034 10/21/21 0416 10/22/21 0438  NA 128* 131* 131*  K 4.8 4.4 4.3  CL 96* 103 104  CO2 '22 22 23  '$ GLUCOSE 109* 79 79  BUN '21 15 13  '$ CREATININE 1.00 0.91 0.86  CALCIUM 8.9 8.4* 8.4*  MG  --  1.9 1.8   GFR: Estimated Creatinine Clearance: 38.9 mL/min (by C-G formula based on SCr of 0.86 mg/dL). Liver Function Tests: No results for input(s): "AST", "ALT", "ALKPHOS", "BILITOT", "PROT", "ALBUMIN" in the last 168 hours. No results for input(s): "LIPASE", "AMYLASE" in the last 168 hours. No results for input(s): "AMMONIA" in the last 168 hours. Coagulation Profile: No results for input(s): "INR", "PROTIME" in the last 168 hours. Cardiac Enzymes: No results for input(s): "CKTOTAL", "CKMB", "CKMBINDEX", "TROPONINI" in the last 168 hours. BNP (last 3 results) No results for input(s): "PROBNP" in the last 8760 hours. HbA1C: No results for input(s): "HGBA1C" in the last 72 hours. CBG: No results for input(s): "GLUCAP" in the last 168 hours. Lipid Profile: No results for input(s): "CHOL", "HDL", "LDLCALC", "TRIG", "CHOLHDL", "LDLDIRECT" in the last 72 hours. Thyroid Function Tests: Recent Labs    10/20/21 0856  TSH 3.716   Anemia Panel: No results for input(s): "VITAMINB12", "FOLATE", "FERRITIN", "TIBC", "IRON", "RETICCTPCT" in the last 72 hours. Sepsis Labs: No results for input(s): "PROCALCITON", "LATICACIDVEN" in the last 168 hours.  Recent Results (from the past 240 hour(s))  Urine Culture     Status: Abnormal   Collection Time: 10/20/21  1:15 AM   Specimen: Urine, Clean Catch  Result Value Ref Range Status   Specimen Description   Final    URINE, CLEAN CATCH Performed at [facility], [address],  [place], [place] [address]    Special Requests   Final    NONE Performed at [facility], [address], [place], [place] [address]    Culture >=100,000 COLONIES/mL ESCHERICHIA COLI (A)  Final   Report Status 10/22/2021 FINAL  Final   Organism ID, Bacteria ESCHERICHIA COLI (A)  Final      Susceptibility   Escherichia coli - MIC*    AMPICILLIN 4 SENSITIVE Sensitive     CEFAZOLIN <=4 SENSITIVE Sensitive     CEFEPIME <=0.12 SENSITIVE Sensitive     CEFTRIAXONE <=0.25 SENSITIVE Sensitive     CIPROFLOXACIN <=0.25 SENSITIVE Sensitive     GENTAMICIN <=1 SENSITIVE Sensitive     IMIPENEM <=0.25 SENSITIVE Sensitive     NITROFURANTOIN <=16 SENSITIVE Sensitive     TRIMETH/SULFA <=20 SENSITIVE Sensitive     AMPICILLIN/SULBACTAM <=2 SENSITIVE Sensitive     PIP/TAZO <=4 SENSITIVE Sensitive     * >=100,000 COLONIES/mL ESCHERICHIA COLI  Radiology Studies: No results found.      Scheduled Meds:  apixaban  2.5 mg Oral BID   carvedilol  6.25 mg Oral BID WC   dicyclomine  10 mg Oral TID AC   multivitamin-lutein  1 capsule Oral BID   pantoprazole  40 mg Oral BID AC   polyvinyl alcohol  1 drop Both Eyes Daily   sodium chloride  1 g Oral TID WC   zolpidem  5 mg Oral QHS     LOS: 0 days    Time spent: 35 minutes    Ledonna Dormer Darleen Crocker, DO [facility]  If 7PM-7AM, please contact night-coverage [email or web address] 10/22/2021, 5:00 PM

## 2021-10-22 NOTE — TOC Progression Note (Signed)
Transition of Care [facility]) - Progression Note    Patient Details  Name: Kimberly [name] MRN: [id number] Date of Birth: 04/05/1927  [facility]) CM/SW Contact  Joaquin Courts, RN Phone Number: 10/22/2021, 1:28 PM  Clinical Narrative:    CM spoke with Jolene Schimke with [facility] who reports their CSW will be coming to the hospital this afternoon to see patient and evaluate.  Carye does request that MD weigh in on capacity and document.  CM communicated this to MD.   Expected Discharge Plan: Assisted Living Barriers to Discharge: Other (must enter comment) (APS and possible guardianship)  Expected Discharge Plan and Services Expected Discharge Plan: Assisted Living In-house Referral: Clinical Social Work     Living arrangements for the past 2 months: [place]                                       Social Determinants of Health (SDOH) Interventions    Readmission Risk Interventions     No data to display

## 2021-10-22 NOTE — NC FL2 (Addendum)
[place] LEVEL OF CARE SCREENING TOOL     IDENTIFICATION  Patient Name: Kimberly [name] Birthdate: 1926-09-30 Sex: female Admission Date (Current Location): 10/19/2021  [facility] and [place] Number:  [employer] and Address:  [facility] [address] [place]      Provider Number: [id number]  Attending Physician Name and Address:  Rodena Goldmann, DO  Relative Name and Phone Number:       Current Level of Care: Hospital Recommended Level of Care: [place] Prior Approval Number:    Date Approved/Denied:   PASRR Number:    Discharge Plan: Other (Comment) (ALF)    Current Diagnoses: Patient Active Problem List   Diagnosis Date Noted   UTI (urinary tract infection) 10/20/2021   Acute esophagitis    Occult GI bleeding 09/30/2021   Alternating constipation and diarrhea 07/26/2020   Gastroesophageal reflux disease 07/26/2020   Dark stools 07/26/2020   Pacemaker battery depletion    Dyspnea 06/17/2020   Chest pain 06/17/2020   Right sided abdominal pain 01/24/2020   Finger dislocation, initial encounter 11/16/2018   Hand trauma, left, initial encounter 11/16/2018   Acute blood loss anemia 10/25/2018   Rectal bleeding 10/23/2018   Acute GI bleeding    Enteritis of small intestine due to enterotoxigenic Escherichia coli associated with diarrhea 03/24/2017   Bipolar affective disorder ([place]) 03/23/2017   Olfactory hallucinations 03/23/2017   Lower GI bleed    Glaucoma 02/28/2017   Pacemaker 03/21/2015   Non-ischemic cardiomyopathy ([place]) 11/28/2014   PUD (peptic ulcer disease) 06/14/2014   History of bipolar disorder    Hypokalemia 05/26/2014   Fall at home 05/26/2014   HFpEF (heart failure with preverved EF) 11/15/2013   Complete heart block ([place]) 11/15/2013   Inability to ambulate due to ankle or foot 09/05/2013   Unspecified constipation 08/26/2013   Anemia 08/25/2013   Acute diverticulitis 08/24/2013    CKD (chronic kidney disease) stage 3, GFR 30-59 ml/min (HCC) 10/12/2012   Chronic anticoagulation 10/12/2012   Osteoarthritis    Permanent atrial fibrillation    Biventricular cardiac pacemaker - Medtronic Consulta    Fibromyalgia    Essential hypertension    Depression     Orientation RESPIRATION BLADDER Height & Weight     Time, Self, Situation, Place  Normal Incontinent Weight: 59 kg Height:  '5\' 7"'$  (170.2 cm)  BEHAVIORAL SYMPTOMS/MOOD NEUROLOGICAL BOWEL NUTRITION STATUS      Continent Diet  AMBULATORY STATUS COMMUNICATION OF NEEDS Skin   Limited Assist Verbally                         Personal Care Assistance Level of Assistance  Bathing, Dressing, Feeding Bathing Assistance: Limited assistance Feeding assistance: Independent Dressing Assistance: Limited assistance     Functional Limitations Info  Sight, Hearing, Speech Sight Info: Adequate Hearing Info: Adequate Speech Info: Adequate    SPECIAL CARE FACTORS FREQUENCY  PT (By licensed PT), OT (By licensed OT)     PT Frequency: 3x weekly OT Frequency: 3x weekly            Contractures Contractures Info: Not present    Additional Factors Info  Code Status, Allergies Code Status Info: Full Allergies Info: Ciprofloxacin Flagyl (metronidazole) papaya Derivatives, Iodine, Penicillins, Sulfa Antibiotics           Current Medications (10/22/2021):     Discharge Medications: Please see discharge summary for a list of discharge  medications.  Relevant Imaging Results:  Relevant Lab Results:   Additional Information SSN [facility] [address] [place], [place]

## 2021-10-22 NOTE — TOC Progression Note (Addendum)
Transition of Care (TOC) - Progression Note    Patient Details  Name: Kimberly [name] MRN: [id number] Date of Birth: 07/01/1926  Transition of Care (TOC) CM/SW Contact  ,  [place], RN Phone Number: 10/22/2021, 4:28 PM  Clinical Narrative:    [facility] in the [facility] living section.  Per facility, it is felt that the patient needs ALF level of care, this CM shared that DSS is expected to see the patient and evaluate for potential guardianship needs, additionally patient did share with CSW yesterday that she wanted to go home versus ALF, though it appears from notes she may not have capacity to make this decision.  [facility] rep reports that if patient does not want to go to ALF, facility will not be able to accept patient back at all as they feel she is unsafe in the independent living environment.  CM faxed FL2 and supporting documents for review to [facility] and their nurse plans to come to hospital tomorrow to evaluate patient.    CM followed up with Carye Dickerson who reports DSS visited with patient and states it appears patient does have capacity to make own decisions at this time.   CM met with patient at bedside to discuss discharge options, explained the feedback from [facility].  Patient initially reluctant to go to alf, however once CM explained that independent living does not appear to be an option, patient did agree that ALF may be the next best choice.     Expected Discharge Plan: Assisted Living Barriers to Discharge: Other (must enter comment) (APS and possible guardianship)  Expected Discharge Plan and Services Expected Discharge Plan: Assisted Living In-house Referral: Clinical Social Work     Living arrangements for the past 2 months: Independent Living Facility                                       Social Determinants of Health (SDOH) Interventions    Readmission Risk  Interventions     No data to display          

## 2021-10-23 DIAGNOSIS — D649 Anemia, unspecified: Secondary | ICD-10-CM

## 2021-10-23 DIAGNOSIS — Z950 Presence of cardiac pacemaker: Secondary | ICD-10-CM | POA: Diagnosis not present

## 2021-10-23 DIAGNOSIS — E871 Hypo-osmolality and hyponatremia: Secondary | ICD-10-CM

## 2021-10-23 DIAGNOSIS — N3000 Acute cystitis without hematuria: Secondary | ICD-10-CM | POA: Diagnosis not present

## 2021-10-23 DIAGNOSIS — I10 Essential (primary) hypertension: Secondary | ICD-10-CM | POA: Diagnosis not present

## 2021-10-23 LAB — CBC
HCT: 27.4 % — ABNORMAL LOW (ref 36.0–46.0)
Hemoglobin: 8.8 g/dL — ABNORMAL LOW (ref 12.0–15.0)
MCH: 28.7 pg (ref 26.0–34.0)
MCHC: 32.1 g/dL (ref 30.0–36.0)
MCV: 89.3 fL (ref 80.0–100.0)
Platelets: 167 10*3/uL (ref 150–400)
RBC: 3.07 MIL/uL — ABNORMAL LOW (ref 3.87–5.11)
RDW: 20.9 % — ABNORMAL HIGH (ref 11.5–15.5)
WBC: 4.2 10*3/uL (ref 4.0–10.5)
nRBC: 0 % (ref 0.0–0.2)

## 2021-10-23 LAB — BASIC METABOLIC PANEL
Anion gap: 5 (ref 5–15)
BUN: 14 mg/dL (ref 8–23)
CO2: 23 mmol/L (ref 22–32)
Calcium: 8.3 mg/dL — ABNORMAL LOW (ref 8.9–10.3)
Chloride: 103 mmol/L (ref 98–111)
Creatinine, Ser: 1 mg/dL (ref 0.44–1.00)
GFR, Estimated: 53 mL/min — ABNORMAL LOW (ref >60)
Glucose, Bld: 110 mg/dL — ABNORMAL HIGH (ref 70–99)
Potassium: 4.2 mmol/L (ref 3.5–5.1)
Sodium: 131 mmol/L — ABNORMAL LOW (ref 135–145)

## 2021-10-23 LAB — MAGNESIUM: Magnesium: 1.7 mg/dL (ref 1.7–2.4)

## 2021-10-23 MED ORDER — MAGNESIUM SULFATE 2 GM/50ML IV SOLN
2.0000 g | Freq: Once | INTRAVENOUS | Status: DC
  Filled 2021-10-23: qty 50

## 2021-10-23 MED ORDER — MAGNESIUM OXIDE -MG SUPPLEMENT 400 (240 MG) MG PO TABS
400.0000 mg | ORAL_TABLET | Freq: Three times a day (TID) | ORAL | Status: DC
  Administered 2021-10-23 – 2021-10-24 (×3): 400 mg via ORAL
  Filled 2021-10-23 (×4): qty 1

## 2021-10-23 NOTE — Assessment & Plan Note (Addendum)
No clinical signs of heart failure. Patient has lower extremity edema, possible related to venous insufficiency. She did not tolerate compression stockings.    Will add furosemide as needed in case of worsening edema. Add K supplements when using furosemide.  Continue with carvedilol.

## 2021-10-23 NOTE — Assessment & Plan Note (Signed)
No agitation.  Intermittent confusion.

## 2021-10-23 NOTE — Assessment & Plan Note (Signed)
Blood pressure has been stable, continue with carvedilol.

## 2021-10-23 NOTE — Assessment & Plan Note (Signed)
Multifactorial anemia, likely related to chronic disease.  Follow up hgb is 8,8i and plt 167.

## 2021-10-23 NOTE — Assessment & Plan Note (Addendum)
Hypomagnesemia.   Patient has received IV fluids with good toleration.   At the time of her discharge she is tolerating po well. Her serum Na is 132, K is 4,0 and serum bicarbonate is 23.   Plan to follow up renal function and electrolytes as outpatient.

## 2021-10-23 NOTE — Hospital Course (Addendum)
Kimberly [name] was admitted to the hospital with the working diagnosis of symptomatic hyponatremia in the setting of urinary tract infection.   88 yo female with the past medical history of atrial fibrillation, CKD, hypertension and bipolar disease who presented with weakness and dizziness. Recent hospitalization for esophagitis and discharged to SNF. At home patient had worsening weakness, and persistent abdominal pain that promoted her to come back to the hospital. On her initial physical examination her blood pressure was 137/66, HR 61, RR 18 and 02 saturation 99%, lungs with clear to auscultation, heart with S1 and S2 present and rhythmic, abdomen soft and no lower extremity edema.   Na 128, K 4,8 Cl 96, bicarbonate 22, glucose 109, bun 21 cr 1,0  Wbc 4,6 hgb 10 plt 217  UA SG 1,010 negative protein >50 wbc  CT abdomen and pelvis with no acute changes. Non obstructing nephrolithiasis. Positive diverticulosis.   EKG 61 bpm, right axis, ventricular paced, with no significant ST segment or T wave changes.   Urine culture positive for E Coli.  Patient was placed on IV fluids and antibiotic therapy with improvement of her symptoms.   Plan to transfer to assisted living facility

## 2021-10-23 NOTE — TOC Progression Note (Signed)
Transition of Care [facility]) - Progression Note    Patient Details  Name: Kimberly [name] MRN: [id number] Date of Birth: [birth year]  Transition of Care [facility]) CM/SW Contact  Shade Flood, [name] Phone Number: 10/23/2021, 10:41 AM  Clinical Narrative:     TOC following. Spoke with Varney Biles from [facility]. She states that Jonelle Sidle will be at [facility] this afternoon to evaluate patient. If pt continues to agree to ALF, the soonest the ALF can admit her is tomorrow.   TOC to re-send requested clinical as Varney Biles states she did not receive it.  Expected Discharge Plan: Assisted Living Barriers to Discharge: Other (must enter comment) (APS and possible guardianship)  Expected Discharge Plan and Services Expected Discharge Plan: Assisted Living In-house Referral: Clinical Social Work     Living arrangements for the past 2 months: [place]                                       Social Determinants of Health (SDOH) Interventions    Readmission Risk Interventions     No data to display

## 2021-10-23 NOTE — Assessment & Plan Note (Signed)
Present on admission, no sepsis.   Urine culture positive for E coli 100,000 CFU pan sensitive. Patient has received antibiotic therapy with fosfomycin with improvement in her symptoms.

## 2021-10-23 NOTE — Progress Notes (Signed)
Progress Note   Patient: Kimberly [name]:[id number] DOB: [birth year] DOA: 10/19/2021     0 DOS: the patient was seen and examined on 10/23/2021   Brief hospital course: Kimberly [name] was admitted to the hospital with the working diagnosis of symptomatic hyponatremia in the setting of urinary tract infection.   88 yo female with the past medical history of atrial fibrillation, CKD, hypertension and bipolar disease who presented with weakness and dizziness. Recent hospitalization for esophagitis and discharged to SNF. At home patient had worsening weakness, and persistent abdominal pain that promoted her to come back to the hospital. On her initial physical examination her blood pressure was 137/66, HR 61, RR 18 and 02 saturation 99%, lungs with clear to auscultation, heart with S1 and S2 present and rhythmic, abdomen soft and no lower extremity edema.   Na 128, K 4,8 Cl 96, bicarbonate 22, glucose 109, bun 21 cr 1,0  Wbc 4,6 hgb 10 plt 217  UA SG 1,010 negative protein >50 wbc  CT abdomen and pelvis with no acute changes. Non obstructing nephrolithiasis. Positive diverticulosis.   EKG 61 bpm, right axis, ventricular paced, with no significant ST segment or T wave changes.   Urine culture positive for E Coli.  Patient was placed on IV fluids and antibiotic therapy with improvement of her symptoms.   Plan to transfer to assisted living facility on 06/22     Assessment and Plan: * UTI (urinary tract infection) Present on admission, no sepsis.   Urine culture positive for E coli 100,000 CFU pan sensitive. Patient has received antibiotic therapy with fosfomycin with improvement in her symptoms.    Hyponatremia Hypomagnesemia.   Patient has received IV fluids with good toleration.  Currently she is tolerating po well.   Follow up renal function with serum Na at 131, K is 4,2 and serum bicarbonate at 23. Mg is 1,7   Plan to continue to hold on IV fluids. Consult nutrition for  recommendations.  Add 2 g mag sulfate.  Discontinue salt tablets.   Follow up renal function and electrolytes in am.   Anemia Multifactorial anemia, likely related to chronic disease.  Follow up hgb is 8,8i and plt 167.   Essential hypertension Blood pressure has been stable, continue with carvedilol.   Bipolar affective disorder ([place]) No agitation.  Intermittent confusion.   Biventricular cardiac pacemaker - Medtronic Consulta No clinical signs of heart failure.  She has chronic lower extremity edema, suspected venous insufficiency Plan to add compression stockings as tolerated.         Subjective: Patient with no chest pain or dyspnea, had difficulty sleeping last night   Physical Exam: Vitals:   10/22/21 0844 10/22/21 1443 10/22/21 2137 10/23/21 0517  BP: (!) 152/78 (!) 162/69 (!) 155/80 (!) 144/65  Pulse: 70 63 (!) 59 (!) 59  Resp: '19 20 20 20  '$ Temp: (!) 97.5 F (36.4 C) 98 F (36.7 C) (!) 97.3 F (36.3 C) 98 F (36.7 C)  TempSrc: Oral  Oral   SpO2: 100% 100% 100% 100%  Weight:      Height:       Neurology awake and alert ENT with no pallor Cardiovascular with S1 and S2 present and rhythmic with no gallops or murmurs Respiratory with no rales Abdomen not tender Positive lower extremity edema bilaterally up to the mid leg., pitting with skin discoloration  Data Reviewed:    Family Communication: no family at the bedside   Disposition: Status is: Observation The  patient remains OBS appropriate and will d/c before 2 midnights.  Planned Discharge Destination:  assisted living facility in am.     Author: Tawni Millers, MD 10/23/2021 12:03 PM  For on call review [email or web address].

## 2021-10-23 NOTE — Progress Notes (Signed)
Initial Nutrition Assessment  DOCUMENTATION CODES:   Not applicable  INTERVENTION:  Encourage adequate PO intake Snacks TID between meals  NUTRITION DIAGNOSIS:   Increased nutrient needs related to acute illness as evidenced by estimated needs.  GOAL:   Patient will meet greater than or equal to 90% of their needs  MONITOR:   PO intake, Labs, Weight trends  REASON FOR ASSESSMENT:   Consult Assessment of nutrition requirement/status  ASSESSMENT:   Pt admitted from home with weakness and dizziness, found to have symptomatic hyponatremia d/t UTI. PMH significant for afib on Eliquis, PPM, bipolar disorder, CKD stage IV, HTN and recent admission for GIB with esophagitis d/c on 6/1 to SNF/rehab and recently returned home.  Pt sitting on bedside commode during visit, so assessment was brief. Pt states that she eats and drinks when she is hungry and only until she is full. At home she recalls eating 3 meals per day with snacks in between and denies changes to her intake. For her snacks she usually has crackers, cookies and ice cream. Pt declines Ensure at this time. Will order snacks between meals as this is what her usual meal pattern is at home.   Meal completions: 06/18: 25%-lunch, 0%-dinner 06/19: 25% x 3 recorded meals 06/21: 50%- breakfast, 100%- lunch  Pt reports a usual wt of 125-130 lbs and denies recent wt loss.  Reviewed wt hx. Pt's wt appears to fluctuate between 61-64 kg within the last year. Current admit wt noted to be 59 kg.  This is a 3.6% wt loss within the last 5 months which is not significant for time frame. Will continue to monitor throughout admission.  Edema: deep pitting BLE  Medications: bentyl, ocuvite-lutein BID, protonix, IV Mg sulfate   Labs: sodium 131 (L), GFR 53  NUTRITION - FOCUSED PHYSICAL EXAM: Deferred to follow up. Pt on bedside commode.   Diet Order:   Diet Order             Diet regular Room service appropriate? Yes; Fluid  consistency: Thin  Diet effective now                   EDUCATION NEEDS:   No education needs have been identified at this time  Skin:  Skin Assessment: Reviewed RN Assessment  Last BM:  6/20 (type 6)  Height:   Ht Readings from Last 1 Encounters:  10/19/21 '5\' 7"'$  (1.702 m)    Weight:   Wt Readings from Last 1 Encounters:  10/19/21 59 kg   BMI:  Body mass index is 20.37 kg/m.  Estimated Nutritional Needs:   Kcal:  1500-1700  Protein:  75-90g  Fluid:  >/=1.5L  Clayborne Dana, RDN, LDN Clinical Nutrition

## 2021-10-24 DIAGNOSIS — D649 Anemia, unspecified: Secondary | ICD-10-CM | POA: Diagnosis not present

## 2021-10-24 DIAGNOSIS — I480 Paroxysmal atrial fibrillation: Secondary | ICD-10-CM | POA: Diagnosis present

## 2021-10-24 DIAGNOSIS — I10 Essential (primary) hypertension: Secondary | ICD-10-CM | POA: Diagnosis not present

## 2021-10-24 DIAGNOSIS — N3000 Acute cystitis without hematuria: Secondary | ICD-10-CM | POA: Diagnosis not present

## 2021-10-24 DIAGNOSIS — I82409 Acute embolism and thrombosis of unspecified deep veins of unspecified lower extremity: Secondary | ICD-10-CM

## 2021-10-24 DIAGNOSIS — Z950 Presence of cardiac pacemaker: Secondary | ICD-10-CM | POA: Diagnosis not present

## 2021-10-24 LAB — BASIC METABOLIC PANEL
Anion gap: 6 (ref 5–15)
BUN: 14 mg/dL (ref 8–23)
CO2: 23 mmol/L (ref 22–32)
Calcium: 8.6 mg/dL — ABNORMAL LOW (ref 8.9–10.3)
Chloride: 103 mmol/L (ref 98–111)
Creatinine, Ser: 1.04 mg/dL — ABNORMAL HIGH (ref 0.44–1.00)
GFR, Estimated: 50 mL/min — ABNORMAL LOW (ref >60)
Glucose, Bld: 81 mg/dL (ref 70–99)
Potassium: 4 mmol/L (ref 3.5–5.1)
Sodium: 132 mmol/L — ABNORMAL LOW (ref 135–145)

## 2021-10-24 MED ORDER — APIXABAN 2.5 MG PO TABS: 2.5000 mg | ORAL_TABLET | Freq: Two times a day (BID) | ORAL | 0 refills | Status: DC

## 2021-10-24 MED ORDER — FUROSEMIDE 20 MG PO TABS: 20.0000 mg | ORAL_TABLET | Freq: Every day | ORAL | 0 refills | Status: DC | PRN

## 2021-10-24 MED ORDER — FUROSEMIDE 20 MG PO TABS
20.0000 mg | ORAL_TABLET | Freq: Every day | ORAL | Status: DC | PRN
Start: 2021-10-24 — End: 2021-10-24

## 2021-10-24 MED ORDER — POTASSIUM CHLORIDE CRYS ER 10 MEQ PO TBCR: 10.0000 meq | EXTENDED_RELEASE_TABLET | Freq: Every day | ORAL | 0 refills | Status: DC | PRN

## 2021-10-24 MED ORDER — CARVEDILOL 6.25 MG PO TABS: 6.2500 mg | ORAL_TABLET | Freq: Two times a day (BID) | ORAL | 0 refills | Status: DC

## 2021-10-24 MED ORDER — POTASSIUM CHLORIDE CRYS ER 10 MEQ PO TBCR: 10.0000 meq | EXTENDED_RELEASE_TABLET | Freq: Every day | ORAL | Status: DC | PRN

## 2021-10-24 NOTE — Assessment & Plan Note (Addendum)
Old records personally reviewed, patient has a chronic DVT left peroneal veins and she was deemed high risk for full anticoagulation. She had a IVC filter placed.

## 2021-10-24 NOTE — Discharge Summary (Addendum)
Physician Discharge Summary   Patient: Kimberly [name] MRN: [id number] DOB: 06-20-1926  Admit date:     10/19/2021  Discharge date: 10/24/21  Discharge Physician: Jimmy Picket Jamol Ginyard   PCP: Curlene Labrum, MD   Recommendations at discharge:    Patient is being discharge on carvedilol for rate control atrial fibrillation and anticoagulation with apixaban.  Will need close follow up as outpatient Patient will benefit from assisted living facility (but facility will not take her due to lack of funding). Per my conversation with social work team, adult protective services have been involved in patient's care as outpatient.  Patient did not tolerate leg compression stockings Added as needed furosemide along with K supplements for signs of volume overload.   Discharge Diagnoses: Principal Problem:   UTI (urinary tract infection) Active Problems:   Hyponatremia   Anemia   Essential hypertension   Bipolar affective disorder ([place])   Biventricular cardiac pacemaker - Medtronic Consulta  Resolved Problems:   * No resolved hospital problems. [facility] Course: Kimberly [name] was admitted to the hospital with the working diagnosis of symptomatic hyponatremia in the setting of urinary tract infection.   88 yo female with the past medical history of atrial fibrillation, CKD, hypertension and bipolar disease who presented with weakness and dizziness. Recent hospitalization for esophagitis and discharged to SNF. At home patient had worsening weakness, and persistent abdominal pain that promoted her to come back to the hospital. On her initial physical examination her blood pressure was 137/66, HR 61, RR 18 and 02 saturation 99%, lungs with clear to auscultation, heart with S1 and S2 present and rhythmic, abdomen soft and no lower extremity edema.   Na 128, K 4,8 Cl 96, bicarbonate 22, glucose 109, bun 21 cr 1,0  Wbc 4,6 hgb 10 plt 217  UA SG 1,010 negative protein >50 wbc  CT abdomen and pelvis  with no acute changes. Non obstructing nephrolithiasis. Positive diverticulosis.   EKG 61 bpm, right axis, ventricular paced, with no significant ST segment or T wave changes.   Urine culture positive for E Coli.  Patient was placed on IV fluids and antibiotic therapy with improvement of her symptoms.   Plan to transfer to assisted living facility    Assessment and Plan: * UTI (urinary tract infection) Present on admission, no sepsis.   Urine culture positive for E coli 100,000 CFU pan sensitive. Patient has received antibiotic therapy with fosfomycin with improvement in her symptoms.    Hyponatremia Hypomagnesemia.   Patient has received IV fluids with good toleration.   At the time of her discharge she is tolerating po well. Her serum Na is 132, K is 4,0 and serum bicarbonate is 23.   Plan to follow up renal function and electrolytes as outpatient.   Anemia Multifactorial anemia, likely related to chronic disease.  Follow up hgb is 8,8i and plt 167.   Essential hypertension Blood pressure has been stable, continue with carvedilol.   DVT (deep venous thrombosis) [facility]) Old records personally reviewed, patient has a chronic DVT left peroneal veins and she was deemed high risk for full anticoagulation. She had a IVC filter placed.   Bipolar affective disorder ([place]) No agitation.  Intermittent confusion.   Chronic diastolic CHF (congestive heart failure) (HCC) No clinical signs of heart failure. Patient has lower extremity edema, possible related to venous insufficiency. She did not tolerate compression stockings.    Will add furosemide as needed in case of worsening edema. Add K supplements  when using furosemide.  Continue with carvedilol.   Paroxysmal atrial fibrillation (HCC) Chronic paroxysmal atrial fibrillation. Patient will continue rate control with carvedilol. Considering her deconditioning and fall risk will hold on anticoagulation for now.  Follow up  as outpatient.          Consultants: none  Procedures performed: none   Disposition: Home Diet recommendation:  Cardiac diet DISCHARGE MEDICATION: Allergies as of 10/24/2021       Reactions   Ciprofloxacin Other (See Comments)   Possibly caused diarrhea November 2018   Flagyl [metronidazole] Other (See Comments)   Possibly caused diarrhea November 2018   Papaya Derivatives Hives   Iodine Rash, Other (See Comments)   REACTION:If injected,  Rash/irritated skin reaction "welts"   Penicillins Hives   DID THE REACTION INVOLVE: Swelling of the face/tongue/throat, SOB, or low BP? Unknown Sudden or severe rash/hives, skin peeling, or the inside of the mouth or nose? Yes Did it require medical treatment? Yes When did it last happen?    39 or 88 years old   If all above answers are "NO", may proceed with cephalosporin use.   Sulfa Antibiotics Rash        Medication List     TAKE these medications    acetaminophen 325 MG tablet Commonly known as: TYLENOL Take 325 mg by mouth every 4 (four) hours as needed for headache.   Blink Tears 0.25 % Soln Generic drug: Polyethylene Glycol 400 Apply 1 drop to eye daily.   carvedilol 6.25 MG tablet Commonly known as: COREG Take 1 tablet (6.25 mg total) by mouth 2 (two) times daily with a meal.   dicyclomine 10 MG capsule Commonly known as: BENTYL Take 1 capsule (10 mg total) by mouth 3 (three) times daily before meals.   furosemide 20 MG tablet Commonly known as: LASIX Take 1 tablet (20 mg total) by mouth daily as needed for edema (for worsening leg swelling or shortness of breath).   ondansetron 4 MG disintegrating tablet Commonly known as: ZOFRAN-ODT Take 4 mg by mouth every 8 (eight) hours as needed for nausea or vomiting.   pantoprazole 40 MG tablet Commonly known as: PROTONIX Take 1 tablet (40 mg total) by mouth 2 (two) times daily before a meal.   potassium chloride 10 MEQ tablet Commonly known as: KLOR-CON M Take 1  tablet (10 mEq total) by mouth daily as needed (take with furosemide).   PreserVision AREDS 2 Caps Take 1 capsule by mouth 2 (two) times daily.   Systane 0.4-0.3 % Soln Generic drug: Polyethyl Glycol-Propyl Glycol Apply 1 drop to eye daily as needed (dry eyes).   tiZANidine 4 MG tablet Commonly known as: ZANAFLEX Take 1 tablet (4 mg total) by mouth every 6 (six) hours as needed (for headaches).   zolpidem 5 MG tablet Commonly known as: AMBIEN Take 1 tablet (5 mg total) by mouth at bedtime.        Discharge Exam: Filed Weights   10/19/21 1932  Weight: 59 kg   BP (!) 139/59 (BP Location: Left Arm)   Pulse 60   Temp 97.6 F (36.4 C)   Resp 20   Ht '5\' 7"'$  (1.702 m)   Wt 59 kg   SpO2 99%   BMI 20.37 kg/m   Patient is feeling well, no dyspnea, no chest pain or abdominal pain  Neurology awake and alert ENT with no pallor Cardiovascular with S1 and S2 present Respiratory with no wheezing Abdomen not distended Lower extremity with ankle  edema   Condition at discharge: stable  The results of significant diagnostics from this hospitalization (including imaging, microbiology, ancillary and laboratory) are listed below for reference.   Imaging Studies: CT ABDOMEN PELVIS WO CONTRAST  Result Date: 10/20/2021 CLINICAL DATA:  Abdominal pain, acute, nonlocalized EXAM: CT ABDOMEN AND PELVIS WITHOUT CONTRAST TECHNIQUE: Multidetector CT imaging of the abdomen and pelvis was performed following the standard protocol without IV contrast. RADIATION DOSE REDUCTION: This exam was performed according to the departmental dose-optimization program which includes automated exposure control, adjustment of the mA and/or kV according to patient size and/or use of iterative reconstruction technique. COMPARISON:  None Available. FINDINGS: Lower chest: Left basilar parenchymal scarring, volume loss, and cylindrical bronchiectasis. Extensive coronary artery calcification. Mild cardiomegaly. Pacemaker  leads seen within the right heart and left ventricular venous outflow. Moderate hiatal hernia. Hepatobiliary: Status post cholecystectomy. No intra or extrahepatic biliary ductal dilation. Cyst noted within the left hepatic lobe. No definite solid intrahepatic mass identified on this noncontrast examination. Pancreas: Unremarkable Spleen: Unremarkable Adrenals/Urinary Tract: The adrenal glands are unremarkable. The kidneys are normal in size and position. 2-3 mm nonobstructing renal calculi are noted within the lower pole the right kidney. No hydronephrosis. No ureteral calculi. The bladder is unremarkable. Stomach/Bowel: Moderate transverse and severe descending and sigmoid colonic diverticulosis is present without superimposed acute inflammatory change. Moderate stool throughout the colon without evidence of obstruction. The appendix is not visualized and is likely absent. Stomach, small bowel, and large bowel are otherwise unremarkable. No free intraperitoneal gas or fluid. Vascular/Lymphatic: Extensive aortoiliac atherosclerotic calcification no aneurysm. Extensive visceral arteriosclerosis vessel patency, however, is not well assessed on this noncontrast examination. No pathologic adenopathy within the abdomen and pelvis. Reproductive: Status post hysterectomy. No adnexal masses. Other: Tiny broad-based fat containing umbilical hernia. Rectum unremarkable. Musculoskeletal: Osseous structures are diffusely osteopenic. Moderate lumbar dextroscoliosis. Superimposed advanced degenerative changes are noted throughout the thoracolumbar spine remote appearing superior endplate fracture of O16 noted. No acute bone abnormality. IMPRESSION: 1. No acute intra-abdominal pathology identified. No definite radiographic explanation for the patient's reported symptoms. 2. Minimal right nonobstructing nephrolithiasis. No urolithiasis. No hydronephrosis. 3. Severe distal colonic diverticulosis without superimposed acute  inflammatory change. Aortic Atherosclerosis (ICD10-I70.0). Electronically Signed   By: Fidela Salisbury M.D.   On: 10/20/2021 01:59   CT HEAD WO CONTRAST (5MM)  Result Date: 10/01/2021 CLINICAL DATA:  Headache, sudden, severe.  Pt c/o severe HA eval EXAM: CT HEAD WITHOUT CONTRAST TECHNIQUE: Contiguous axial images were obtained from the base of the skull through the vertex without intravenous contrast. RADIATION DOSE REDUCTION: This exam was performed according to the departmental dose-optimization program which includes automated exposure control, adjustment of the mA and/or kV according to patient size and/or use of iterative reconstruction technique. COMPARISON:  CT head 09/23/2021 BRAIN: BRAIN Patchy and confluent areas of decreased attenuation are noted throughout the deep and periventricular white matter of the cerebral hemispheres bilaterally, compatible with chronic microvascular ischemic disease. No evidence of large-territorial acute infarction. No parenchymal hemorrhage. No mass lesion. No extra-axial collection. No mass effect or midline shift. No hydrocephalus. Basilar cisterns are patent. Vascular: No hyperdense vessel. Atherosclerotic calcifications are present within the cavernous internal carotid arteries. Skull: No acute fracture or focal lesion. Sinuses/Orbits: Paranasal sinuses and mastoid air cells are clear. Bilateral lens replacement. Otherwise the orbits are unremarkable. Other: None. IMPRESSION: No acute intracranial abnormality. Electronically Signed   By: Iven Finn M.D.   On: 10/01/2021 21:50    Microbiology: Results for orders  placed or performed during the hospital encounter of 10/19/21  Urine Culture     Status: Abnormal   Collection Time: 10/20/21  1:15 AM   Specimen: Urine, Clean Catch  Result Value Ref Range Status   Specimen Description   Final    URINE, CLEAN CATCH Performed at [facility], [address] [place], [place] [address]    Special Requests   Final     NONE Performed at [facility], [address], [place], [place] [address]    Culture >=100,000 COLONIES/mL ESCHERICHIA COLI (A)  Final   Report Status 10/22/2021 FINAL  Final   Organism ID, Bacteria ESCHERICHIA COLI (A)  Final      Susceptibility   Escherichia coli - MIC*    AMPICILLIN 4 SENSITIVE Sensitive     CEFAZOLIN <=4 SENSITIVE Sensitive     CEFEPIME <=0.12 SENSITIVE Sensitive     CEFTRIAXONE <=0.25 SENSITIVE Sensitive     CIPROFLOXACIN <=0.25 SENSITIVE Sensitive     GENTAMICIN <=1 SENSITIVE Sensitive     IMIPENEM <=0.25 SENSITIVE Sensitive     NITROFURANTOIN <=16 SENSITIVE Sensitive     TRIMETH/SULFA <=20 SENSITIVE Sensitive     AMPICILLIN/SULBACTAM <=2 SENSITIVE Sensitive     PIP/TAZO <=4 SENSITIVE Sensitive     * >=100,000 COLONIES/mL ESCHERICHIA COLI    Labs: CBC: Recent Labs  Lab 10/19/21 2034 10/21/21 0416 10/22/21 0438 10/23/21 0414  WBC 4.6 4.6 4.4 4.2  HGB 10.0* 9.2* 8.7* 8.8*  HCT 30.5* 28.8* 27.4* 27.4*  MCV 87.6 89.4 89.5 89.3  PLT 217 176 168 453   Basic Metabolic Panel: Recent Labs  Lab 10/19/21 2034 10/21/21 0416 10/22/21 0438 10/23/21 0414 10/24/21 0435  NA 128* 131* 131* 131* 132*  K 4.8 4.4 4.3 4.2 4.0  CL 96* 103 104 103 103  CO2 '22 22 23 23 23  '$ GLUCOSE 109* 79 79 110* 81  BUN '21 15 13 14 14  '$ CREATININE 1.00 0.91 0.86 1.00 1.04*  CALCIUM 8.9 8.4* 8.4* 8.3* 8.6*  MG  --  1.9 1.8 1.7  --    Liver Function Tests: No results for input(s): "AST", "ALT", "ALKPHOS", "BILITOT", "PROT", "ALBUMIN" in the last 168 hours. CBG: No results for input(s): "GLUCAP" in the last 168 hours.  Discharge time spent: greater than 30 minutes.  Signed: Tawni Millers, MD [facility] 10/24/2021

## 2021-10-24 NOTE — TOC Transition Note (Signed)
Transition of Care [facility]) - CM/SW Discharge Note   Patient Details  Name: ESTALEE MCCANDLISH MRN: [id number] Date of Birth: 05-Oct-1926  Transition of Care [facility]) CM/SW Contact:  Shade Flood, [name] Phone Number: 10/24/2021, 11:54 AM   Clinical Narrative:     Pt remains medically stable for dc per MD. TOC spoke with Keshia at [facility] this AM and was informed that they are not able to admit pt after all due to lack of funding she has available. Updated DSS supervisor. Presented options to pt who elects to return to her home. DSS stating that pt has capacity and can choose to return home. Awaiting DSS delivery of pt's house keys and then transport will be arranged.   No other TOC needs at this time.    Final next level of care: Home/Self Care Barriers to Discharge: Barriers Resolved   Patient Goals and CMS Choice Patient states their goals for this hospitalization and ongoing recovery are:: go home      Discharge Placement                       Discharge Plan and Services In-house Referral: Clinical Social Work                                   Social Determinants of Health (SDOH) Interventions     Readmission Risk Interventions     No data to display

## 2021-10-24 NOTE — Assessment & Plan Note (Signed)
Chronic paroxysmal atrial fibrillation. Patient will continue rate control with carvedilol. Considering her deconditioning and fall risk will hold on anticoagulation for now.  Follow up as outpatient.

## 2021-10-25 ENCOUNTER — Ambulatory Visit (INDEPENDENT_AMBULATORY_CARE_PROVIDER_SITE_OTHER): Payer: Medicare Other

## 2021-10-25 DIAGNOSIS — I442 Atrioventricular block, complete: Secondary | ICD-10-CM | POA: Diagnosis not present

## 2021-10-28 ENCOUNTER — Telehealth: Payer: Self-pay | Admitting: Cardiovascular Disease

## 2021-10-29 LAB — CUP PACEART REMOTE DEVICE CHECK
Battery Remaining Longevity: 69 mo
Battery Voltage: 2.98 V
Brady Statistic AP VP Percent: 0 %
Brady Statistic AP VS Percent: 0 %
Brady Statistic AS VP Percent: 99.67 %
Brady Statistic AS VS Percent: 0.33 %
Brady Statistic RA Percent Paced: 0 %
Brady Statistic RV Percent Paced: 99.66 %
Date Time Interrogation Session: 20230622164619
Implantable Lead Implant Date: 19971124
Implantable Lead Implant Date: 20070831
Implantable Lead Location: 753858
Implantable Lead Location: 753860
Implantable Lead Model: 4024
Implantable Lead Model: 4194
Implantable Pulse Generator Implant Date: 20220214
Lead Channel Impedance Value: 3363 Ohm
Lead Channel Impedance Value: 3363 Ohm
Lead Channel Impedance Value: 399 Ohm
Lead Channel Impedance Value: 399 Ohm
Lead Channel Impedance Value: 437 Ohm
Lead Channel Impedance Value: 456 Ohm
Lead Channel Impedance Value: 551 Ohm
Lead Channel Impedance Value: 608 Ohm
Lead Channel Impedance Value: 779 Ohm
Lead Channel Pacing Threshold Amplitude: 0.625 V
Lead Channel Pacing Threshold Amplitude: 2.125 V
Lead Channel Pacing Threshold Pulse Width: 0.4 ms
Lead Channel Pacing Threshold Pulse Width: 1 ms
Lead Channel Sensing Intrinsic Amplitude: 0.25 mV
Lead Channel Sensing Intrinsic Amplitude: 0.25 mV
Lead Channel Setting Pacing Amplitude: 2.5 V
Lead Channel Setting Pacing Amplitude: 3.25 V
Lead Channel Setting Pacing Pulse Width: 0.4 ms
Lead Channel Setting Pacing Pulse Width: 1 ms
Lead Channel Setting Sensing Sensitivity: 1.2 mV

## 2021-10-30 ENCOUNTER — Telehealth: Payer: Self-pay | Admitting: Cardiovascular Disease

## 2021-10-30 NOTE — Telephone Encounter (Signed)
[phone], this is the wrong number per office staff that answered. Pt thinks that the swelling on her LE (from her Knees to ankles) is cellulite. When talking I can here her she is SOB, but she states she is not. She will say a few words and needs to take a deep breath. She states that she takes lasix '20mg'$  daily, she will add and addition '20mg'$  daily X3 days. She currently does not weigh daily, but will will do this for the next few days. She will call back with result.

## 2021-10-30 NOTE — Progress Notes (Signed)
Remote pacemaker transmission.   

## 2021-10-30 NOTE — Telephone Encounter (Signed)
Pt c/o swelling: STAT is pt has developed SOB within 24 hours  If swelling, where is the swelling located? Knees to ankles   How much weight have you gained and in what time span? no  Have you gained 3 pounds in a day or 5 pounds in a week? no  Do you have a log of your daily weights (if so, list)? no  Are you currently taking a fluid pill? Yes   Are you currently SOB? Not anymore than usual  Have you traveled recently? No   Pt states that she figured out why she was swelling and it is because of cellulite. She said she is taking a fluid pill and not SOB at the moment

## 2021-10-31 ENCOUNTER — Telehealth: Payer: Self-pay | Admitting: Cardiovascular Disease

## 2021-10-31 NOTE — Telephone Encounter (Signed)
Pt returning a call. Could not find a phone note from today. Please Advise.   She states to call [phone]

## 2021-11-06 ENCOUNTER — Other Ambulatory Visit: Payer: Self-pay | Admitting: Cardiovascular Disease

## 2021-11-06 ENCOUNTER — Ambulatory Visit: Payer: Medicare Other | Admitting: Physician Assistant

## 2021-11-08 MED ORDER — CARVEDILOL 6.25 MG PO TABS: 6.2500 mg | ORAL_TABLET | Freq: Two times a day (BID) | ORAL | 3 refills | Status: DC

## 2021-11-08 NOTE — Addendum Note (Signed)
Addended by: Ricci Barker on: 11/08/2021 08:07 PM   Modules accepted: Orders

## 2021-11-26 ENCOUNTER — Encounter: Payer: Self-pay | Admitting: *Deleted

## 2021-12-01 NOTE — Progress Notes (Unsigned)
Cardiology Clinic Note   Patient Name: Kimberly [name] Date of Encounter: 12/04/2021  Primary Care Provider:  Curlene Labrum, MD Primary Cardiologist:  Sanda Klein, MD  Patient Profile    Kimberly [name] 88 year old female presents to the clinic today for follow-up evaluation of her permanent atrial fibrillation chronic diastolic CHF.  Past Medical History    Past Medical History:  Diagnosis Date   Acute blood loss anemia 10/11/2012   Acute diverticulitis 08/24/2013   Acute on chronic combined systolic and diastolic CHF, NYHA class 4 (HCC) 11/15/2013   Antral ulcer 10/11/2012   Aortic atherosclerosis (HCC)    Arrhythmia    atrial fibb   Atrial fibrillation (HCC)    Bipolar affective disorder ([place]) 03/23/2017   Cardiomyopathy, nonischemic (HCC)    Chronic anticoagulation 10/12/2012   CKD (chronic kidney disease) stage 3, GFR 30-59 ml/min (HCC) 10/12/2012   Depression    Diverticulosis    DVT (deep venous thrombosis) (HCC)    left peroneal veins/IVC Filter placed as per hospital discharge 10/24/21   Erosive esophagitis 10/11/2012   Esophagitis    Fibromyalgia    Glaucoma    H/O echocardiogram 2007   EF 40-45%,          Hiatal hernia    History of anemia due to CKD    Hypertension    Internal hemorrhoids    Nephrolithiasis    Osteoarthritis    Pacemaker    Last saw cards 07/2013   Prepyloric ulcer    Scoliosis    Tubular adenoma of colon    Past Surgical History:  Procedure Laterality Date   ABDOMINAL HYSTERECTOMY     APPENDECTOMY     BACK SURGERY     BIOPSY  03/03/2017   Procedure: BIOPSY;  Surgeon: Danie Binder, MD;  [facility]: [facility];  Service: Endoscopy;;  gastric   BREAST SURGERY     CARDIAC CATHETERIZATION  12/08/2005   LAD AND LEFT MAIN WITH NO HIGH-GRADE STENOSIS. MILD DISEASE IN THE CX AND LAD SYSTEM. SEVERE LV DYSFUNCTION WITH DILATION OF THE LV. EF 15-20%. LV END-DIASTOLIC PRESSURE IS 90. +1 MR.   CHOLECYSTECTOMY     COLONOSCOPY N/A  03/03/2017   Dr. Oneida Alar: 5 colon polyps removed, adenomatous.  Diverticulosis.  99% of the colon was cleared but the cecum was not adequately seen.   CYSTOSCOPY N/A 02/24/2013   Procedure: CYSTOSCOPY WITH URETHRAL DILITATION;  Surgeon: Marissa Nestle, MD;  Location: [facility];  Service: Urology;  Laterality: N/A;   DOPPLER ECHOCARDIOGRAPHY N/A 05/30/2010   LV SIZE IS NORMAL. LV SYSTOLIC FUNCTION IS LOW NORMAL. EF=50-55%. MILD INFERIOR HYPOKINESIS.MILD TO MODERATE POSTERIOR WALL HYPOKINESIS.PACEMAKER LEAD IN THE RV. LA IS MILDLY DILATED. RA IS MODERATE TO SEVERLY DILATED. PACEMAKER LEAD IN THE RA. MILD CALCICICATION OF THE MV APPARATUS. MODERATE MR. MILD TO MODERATE TR. MILD PHTN.AV MILDLY SCLEROTIC.   ESOPHAGOGASTRODUODENOSCOPY N/A 10/13/2012   Dr. Gala Romney: severe ulcerative reflux esophagitis, question of Barrett's but negative path, single deep prepyloric antral ulcer, negative H.pylori   ESOPHAGOGASTRODUODENOSCOPY N/A 03/03/2017   Dr. Oneida Alar: Gastritis/duodenitis, no H. pylori   ESOPHAGOGASTRODUODENOSCOPY (EGD) WITH PROPOFOL N/A 10/01/2021   Procedure: ESOPHAGOGASTRODUODENOSCOPY (EGD) WITH PROPOFOL;  Surgeon: Jerene Bears, MD;  Location: [facility];  Service: Gastroenterology;  Laterality: N/A;   HERNIA REPAIR     right inguinal hernia and umbilical   LOWETR EXT VENOUS Bilateral 11-08-10   R & L- NO EVIDENCE OF THROMBUS OR THROMBOPHLEBITIS. THERE IS MILD  AMOUNT OF SUBCUTANEOUS EDEMA NOTED WITHIN THE LEFT CALF AND ANKLE. R & L GSV AND SSV- NO VENOUS INSUFF NOTED.   NECK SURGERY     NUCLEAR STRESS TEST N/A 02/13/2009   NORMAL PATTERN OF PERFUSION IN ALL REGIONS. POST STRESS VENTICULAR SIZE IS NORMAL. POST  STESS EF 85%.  NORMAL MYOCARDIAL PERFUSION STUDY.   OPEN REDUCTION INTERNAL FIXATION (ORIF) DISTAL PHALANX Left 11/16/2018   Procedure: MIDDLE FINGER OPEN REDUCTION VERSUS RECONSTRUCTION;  Surgeon: Roseanne Kaufman, MD;  Location: [facility];  Service: Orthopedics;  Laterality: Left;   PACEMAKER  INSERTION     POLYPECTOMY  03/03/2017   Procedure: POLYPECTOMY;  Surgeon: Danie Binder, MD;  [facility];  Service: Endoscopy;;  colon   PPM GENERATOR CHANGEOUT N/A 06/18/2020   Procedure: PPM GENERATOR CHANGEOUT;  Surgeon: Sanda Klein, MD;  Location: [facility];  Service: Cardiovascular;  Laterality: N/A;   TONSILLECTOMY     YAG LASER APPLICATION Bilateral 4/65/0354   Procedure: YAG LASER APPLICATION;  Surgeon: Williams Che, MD;  Location: [facility];  Service: Ophthalmology;  Laterality: Bilateral;    Allergies  Allergies  Allergen Reactions   Ciprofloxacin Other (See Comments)    Possibly caused diarrhea November 2018   Flagyl [Metronidazole] Other (See Comments)    Possibly caused diarrhea November 2018   Papaya Derivatives Hives   Iodine Rash and Other (See Comments)    REACTION:If injected,  Rash/irritated skin reaction "welts"   Penicillins Hives    DID THE REACTION INVOLVE: Swelling of the face/tongue/throat, SOB, or low BP? Unknown Sudden or severe rash/hives, skin peeling, or the inside of the mouth or nose? Yes Did it require medical treatment? Yes When did it last happen?    16 or 88 years old   If all above answers are "NO", may proceed with cephalosporin use.   Sulfa Antibiotics Rash    History of Present Illness    Kimberly [name] has a PMH of permanent atrial fibrillation, essential hypertension, chronic diastolic CHF, complete heart block, nonischemic cardiomyopathy, lower GI bleed, CKD stage III, fibromyalgia, depression, pacemaker, and bipolar disorder.  She was seen by Almyra Deforest, PA-C on 5/21 at that time her furosemide was increased to 40 mg twice daily x3 days.  An echocardiogram was ordered but has not been completed.  She was then instructed to reduce back to her 20 mg daily dosing.  She presented to the emergency department 5/30-6/1 with increasing shortness of breath.  During the encounter she required psychiatric clearance due to concern  of bipolar and dementia behaviors after concerns were raised by her daughter.  She was cleared by psych.  She was discharged home.  BP at that time was 123/66.  She was admitted to the hospital on 10/20/2021 and discharged on 10/24/2021 with symptomatic hyponatremia in the setting of urinary tract infection.  No of note she had recently been hospitalized for esophagitis and discharged to SNF.  While at home she had worsening weakness and persistent abdominal pain which prompted a return to the hospital.  Her blood pressure was 137/66 with a heart rate of 61 and respiratory rate of 18.  Her urine culture was positive for E. coli. She was treated with IV fluids and antibiotics. Her symptoms improved and she was transferred to assisted living.   She presents the clinic today for follow-up evaluation and states she feels well.  She has returned to her home.  She did not like the assisted living facility.  She reports that she has assistance at home if needed and also wears a pendant necklace so that she may alert help if she needs it.  We reviewed her recent hospitalization.  She expressed understanding.  She continues to be physically active doing her chair exercises and ambulates with her walker.  I will order a BMP today, give her the salty 6 diet sheet, have her elevate her lower extremities when not active, and follow-up in 3 to 4 months.  Today she denies chest pain, shortness of breath, lower extremity edema, fatigue, palpitations, melena, hematuria, hemoptysis, diaphoresis, weakness, presyncope, syncope, orthopnea, and PND.     Home Medications    Prior to Admission medications   Medication Sig Start Date End Date Taking? Authorizing Provider  acetaminophen (TYLENOL) 325 MG tablet Take 325 mg by mouth every 4 (four) hours as needed for headache.    [provider]  carvedilol (COREG) 6.25 MG tablet Take 1 tablet (6.25 mg total) by mouth 2 (two) times daily with a meal. 11/08/21   Croitoru,  Mihai, MD  dicyclomine (BENTYL) 10 MG capsule Take 1 capsule (10 mg total) by mouth 3 (three) times daily before meals. 05/24/21   Triplett, Tammy, PA-C  furosemide (LASIX) 20 MG tablet Take 1 tablet (20 mg total) by mouth daily as needed for edema (for worsening leg swelling or shortness of breath). 10/24/21   Arrien, Jimmy Picket, MD  Multiple Vitamins-Minerals (PRESERVISION AREDS 2) CAPS Take 1 capsule by mouth 2 (two) times daily.     [provider]  ondansetron (ZOFRAN-ODT) 4 MG disintegrating tablet Take 4 mg by mouth every 8 (eight) hours as needed for nausea or vomiting. 09/24/21   [provider]  pantoprazole (PROTONIX) 40 MG tablet Take 1 tablet (40 mg total) by mouth 2 (two) times daily before a meal. 10/03/21   Danford, Suann Larry, MD  Polyethyl Glycol-Propyl Glycol (SYSTANE) 0.4-0.3 % SOLN Apply 1 drop to eye daily as needed (dry eyes).    [provider]  Polyethylene Glycol 400 (BLINK TEARS) 0.25 % SOLN Apply 1 drop to eye daily.    [provider]  potassium chloride (KLOR-CON M) 10 MEQ tablet Take 1 tablet (10 mEq total) by mouth daily as needed (take with furosemide). 10/24/21   Arrien, Jimmy Picket, MD  tiZANidine (ZANAFLEX) 4 MG tablet Take 1 tablet (4 mg total) by mouth every 6 (six) hours as needed (for headaches). 10/03/21   Danford, Suann Larry, MD  zolpidem (AMBIEN) 5 MG tablet Take 1 tablet (5 mg total) by mouth at bedtime. 10/03/21   Danford, Suann Larry, MD    Family History    Family History  Problem Relation Age of Onset   Cancer Sister    Asthma Sister    Heart failure Brother    Pulmonary embolism Brother    Cancer Brother    Colon cancer Neg Hx    She indicated that her mother is deceased. She indicated that her father is deceased. She indicated that both of her sisters are deceased. She indicated that all of her four brothers are deceased. She indicated that the status of her neg hx is unknown.  Social History     Social History   Socioeconomic History   Marital status: Widowed    Spouse name: Not on file   Number of children: Not on file   Years of education: Not on file   Highest education level: Some college, no degree  Occupational History  Occupation: IT trainer: RETIRED    Comment: retired  Tobacco Use   Smoking status: Former    Packs/day: 0.50    Years: 30.00    Total pack years: 15.00    Types: Cigarettes    Quit date: 12/13/2004    Years since quitting: 16.9   Smokeless tobacco: Never   Tobacco comments:    former smoker  Vaping Use   Vaping Use: Never used  Substance and Sexual Activity   Alcohol use: Not Currently    Comment: history of drinking a 1/2 glass of wine in the evening   Drug use: No   Sexual activity: Never  Other Topics Concern   Not on file  Social History Narrative   No home exercise program. PT ordered this week.   Social Determinants of Health   Financial Resource Strain: Low Risk  (06/04/2020)   Overall Financial Resource Strain (CARDIA)    Difficulty of Paying Living Expenses: Not very hard  Food Insecurity: No Food Insecurity (06/04/2020)   Hunger Vital Sign    Worried About Running Out of Food in the Last Year: Never true    Ran Out of Food in the Last Year: Never true  Transportation Needs: Unmet Transportation Needs (06/04/2020)   PRAPARE - Hydrologist (Medical): Yes    Lack of Transportation (Non-Medical): No  Physical Activity: Inactive (08/15/2019)   Exercise Vital Sign    Days of Exercise per Week: 0 days    Minutes of Exercise per Session: 0 min  Stress: No Stress Concern Present (10/22/2018)   [place]    Feeling of Stress : Only a little  Social Connections: Unknown (08/15/2019)   Social Connection and Isolation Panel [NHANES]    Frequency of Communication with Friends and Family: Twice a week    Frequency of Social  Gatherings with Friends and Family: Once a week    Attends Religious Services: Never    Marine scientist or Organizations: No    Attends Music therapist: Not on file    Marital Status: Not on file  Intimate Partner Violence: Not At Risk (10/22/2018)   Humiliation, Afraid, Rape, and Kick questionnaire    Fear of Current or Ex-Partner: No    Emotionally Abused: No    Physically Abused: No    Sexually Abused: No     Review of Systems    General:  No chills, fever, night sweats or weight changes.  Cardiovascular:  No chest pain, dyspnea on exertion, edema, orthopnea, palpitations, paroxysmal nocturnal dyspnea. Dermatological: No rash, lesions/masses Respiratory: No cough, dyspnea Urologic: No hematuria, dysuria Abdominal:   No nausea, vomiting, diarrhea, bright red blood per rectum, melena, or hematemesis Neurologic:  No visual changes, wkns, changes in mental status. All other systems reviewed and are otherwise negative except as noted above.  Physical Exam    VS:  BP 135/78   Pulse 65   Ht '5\' 8"'$  (1.727 m)   Wt 127 lb 3.2 oz (57.7 kg)   SpO2 95%   BMI 19.34 kg/m  , BMI Body mass index is 19.34 kg/m. GEN: Well nourished, well developed, in no acute distress. HEENT: normal. Neck: Supple, no JVD, carotid bruits, or masses. Cardiac: RRR, no murmurs, rubs, or gallops. No clubbing, cyanosis, ankle edema left greater than right.  Radials/DP/PT 2+ and equal bilaterally.  Respiratory:  Respirations regular and unlabored, clear to auscultation  bilaterally. GI: Soft, nontender, nondistended, BS + x 4. MS: no deformity or atrophy. Skin: warm and dry, no rash. Neuro:  Strength and sensation are intact. Psych: Normal affect.  Accessory Clinical Findings    Recent Labs: 05/24/2021: ALT 11 10/20/2021: TSH 3.716 10/23/2021: Hemoglobin 8.8; Magnesium 1.7; Platelets 167 10/24/2021: BUN 14; Creatinine, Ser 1.04; Potassium 4.0; Sodium 132   Recent Lipid Panel     Component Value Date/Time   CHOL 179 03/15/2018 1645   TRIG 65 03/15/2018 1645   HDL 84 03/15/2018 1645   CHOLHDL 2.1 03/15/2018 1645   CHOLHDL 2.3 02/06/2009 0450   VLDL 16 02/06/2009 0450   LDLCALC 82 03/15/2018 1645    ECG personally reviewed by me today-none today.  Echocardiogram 11/21/19  IMPRESSIONS     1. Left ventricular ejection fraction, by estimation, is 55 to 60%. The  left ventricle has normal function. The left ventricle has no regional  wall motion abnormalities. Left ventricular diastolic function could not  be evaluated.   2. Right ventricular systolic function is normal. The right ventricular  size is normal. There is mildly elevated pulmonary artery systolic  pressure. The estimated right ventricular systolic pressure is 44.3 mmHg.   3. Left atrial size was severely dilated.   4. Right atrial size was severely dilated.   5. The mitral valve is degenerative. Mild mitral valve regurgitation. No  evidence of mitral stenosis.   6. The aortic valve is tricuspid. Aortic valve regurgitation is not  visualized. Mild to moderate aortic valve sclerosis/calcification is  present, without any evidence of aortic stenosis.   7. The inferior vena cava is normal in size with greater than 50%  respiratory variability, suggesting right atrial pressure of 3 mmHg.   Comparison(s): No significant change from prior study. Prior EF 60-65%  (2016).   Assessment & Plan   1.  Chronic diastolic heart failure -no increased work of breathing or activity intolerance.  Generalized ankle edema left greater than right nonpitting . Continue furosemide , potassium, carvedilol Heart healthy low-sodium diet Increase physical activity as tolerated Lower extremity support stockings Elevate lower extremities when not active Order BMP  Dyspnea-no increased work of breathing today.  Previously recommended follow-up echocardiogram on 09/23/2019.   Nonischemic cardiomyopathy-status post  CRT-D.  Echocardiogram 2016 showed normal EF. Heart healthy low-sodium diet Increase physical activity as tolerated  DVT-history of chronic DVT left peroneal veins.  She received IVC filter due to being high risk for full anticoagulation.  Permanent atrial fibrillation status post AV nodal ablation-pacemaker dependent. Continue Eliquis Followed by EP  Anemia-multifactorial.  Felt to be related to chronic disease.  Hemoglobin 8.8 on 10/23/2021.  Denies bleeding issues. Follows with PCP  Disposition: Follow-up with Dr. Sallyanne Kuster in 3-4 months.   Jossie Ng. Song Myre NP-C     12/04/2021, 11:51 AM [place] [address] Office [phone] Fax [phone]  Notice: This dictation was prepared with Dragon dictation along with smaller phrase technology. Any transcriptional errors that result from this process are unintentional and may not be corrected upon review.  I spent 13 minutes examining this patient, reviewing medications, and using patient centered shared decision making involving her cardiac care.  Prior to her visit I spent greater than 20 minutes reviewing her past medical history,  medications, and prior cardiac tests.

## 2021-12-04 ENCOUNTER — Encounter: Payer: Self-pay | Admitting: General Practice

## 2021-12-04 ENCOUNTER — Ambulatory Visit (INDEPENDENT_AMBULATORY_CARE_PROVIDER_SITE_OTHER): Payer: Medicare Other | Admitting: General Practice

## 2021-12-04 VITALS — BP 135/78 | HR 65 | Ht 68.0 in | Wt 127.2 lb

## 2021-12-04 DIAGNOSIS — R0602 Shortness of breath: Secondary | ICD-10-CM | POA: Diagnosis not present

## 2021-12-04 DIAGNOSIS — I4821 Permanent atrial fibrillation: Secondary | ICD-10-CM | POA: Diagnosis not present

## 2021-12-04 DIAGNOSIS — I428 Other cardiomyopathies: Secondary | ICD-10-CM

## 2021-12-04 DIAGNOSIS — I5032 Chronic diastolic (congestive) heart failure: Secondary | ICD-10-CM | POA: Diagnosis not present

## 2021-12-04 DIAGNOSIS — Z950 Presence of cardiac pacemaker: Secondary | ICD-10-CM

## 2021-12-04 NOTE — Patient Instructions (Signed)
Medication Instructions:  The current medical regimen is effective;  continue present plan and medications as directed. Please refer to the Current Medication list given to you today.   *If you need a refill on your cardiac medications before your next appointment, please call your pharmacy*  Lab Work:   Testing/Procedures:  BMET TODAY   NONE  If you have labs (blood work) drawn today and your tests are completely normal, you will receive your results only by: [place] (if you have MyChart) OR  A paper copy in the mail If you have any lab test that is abnormal or we need to change your treatment, we will call you to review the results.  Special Instructions PLEASE READ AND FOLLOW SALTY 6-ATTACHED-1,'800mg'$  daily  PLEASE INCREASE PHYSICAL ACTIVITY AS TOLERATED   Follow-Up: Your next appointment:  4 month(s) In Person with Sanda Klein, MD   At [facility], you and your health needs are our priority.  As part of our continuing mission to provide you with exceptional heart care, we have created designated Provider Care Teams.  These Care Teams include your primary Cardiologist (physician) and Advanced Practice Providers (APPs -  Physician Assistants and Nurse Practitioners) who all work together to provide you with the care you need, when you need it.  We recommend signing up for the patient portal called "MyChart".  Sign up information is provided on this After Visit Summary.  MyChart is used to connect with patients for Virtual Visits (Telemedicine).  Patients are able to view lab/test results, encounter notes, upcoming appointments, etc.  Non-urgent messages can be sent to your provider as well.   To learn more about what you can do with MyChart, go to [email or web address].    Important Information About Sugar             6 SALTY THINGS TO AVOID     1,'800MG'$  DAILY

## 2021-12-05 LAB — BASIC METABOLIC PANEL
BUN/Creatinine Ratio: 17 (ref 12–28)
BUN: 21 mg/dL (ref 10–36)
CO2: 22 mmol/L (ref 20–29)
Calcium: 9.4 mg/dL (ref 8.7–10.3)
Chloride: 99 mmol/L (ref 96–106)
Creatinine, Ser: 1.22 mg/dL — ABNORMAL HIGH (ref 0.57–1.00)
Glucose: 92 mg/dL (ref 70–99)
Potassium: 4.2 mmol/L (ref 3.5–5.2)
Sodium: 141 mmol/L (ref 134–144)
eGFR: 42 mL/min/{1.73_m2} — ABNORMAL LOW (ref >59)

## 2021-12-11 ENCOUNTER — Ambulatory Visit: Payer: Medicare Other | Admitting: Internal Medicine

## 2022-01-24 ENCOUNTER — Ambulatory Visit (INDEPENDENT_AMBULATORY_CARE_PROVIDER_SITE_OTHER): Payer: Medicare Other

## 2022-01-24 DIAGNOSIS — I428 Other cardiomyopathies: Secondary | ICD-10-CM

## 2022-01-27 LAB — CUP PACEART REMOTE DEVICE CHECK
Battery Remaining Longevity: 85 mo
Battery Voltage: 2.99 V
Brady Statistic AP VP Percent: 0 %
Brady Statistic AP VS Percent: 0 %
Brady Statistic AS VP Percent: 99.56 %
Brady Statistic AS VS Percent: 0.44 %
Brady Statistic RA Percent Paced: 0 %
Brady Statistic RV Percent Paced: 99.55 %
Date Time Interrogation Session: 20230921191801
Implantable Lead Implant Date: 19971124
Implantable Lead Implant Date: 20070831
Implantable Lead Location: 753858
Implantable Lead Location: 753860
Implantable Lead Model: 4024
Implantable Lead Model: 4194
Implantable Pulse Generator Implant Date: 20220214
Lead Channel Impedance Value: 3363 Ohm
Lead Channel Impedance Value: 3363 Ohm
Lead Channel Impedance Value: 399 Ohm
Lead Channel Impedance Value: 418 Ohm
Lead Channel Impedance Value: 437 Ohm
Lead Channel Impedance Value: 475 Ohm
Lead Channel Impedance Value: 570 Ohm
Lead Channel Impedance Value: 627 Ohm
Lead Channel Impedance Value: 836 Ohm
Lead Channel Pacing Threshold Amplitude: 0.625 V
Lead Channel Pacing Threshold Amplitude: 1.625 V
Lead Channel Pacing Threshold Pulse Width: 0.4 ms
Lead Channel Pacing Threshold Pulse Width: 1 ms
Lead Channel Sensing Intrinsic Amplitude: 0.25 mV
Lead Channel Sensing Intrinsic Amplitude: 0.25 mV
Lead Channel Setting Pacing Amplitude: 2.25 V
Lead Channel Setting Pacing Amplitude: 2.5 V
Lead Channel Setting Pacing Pulse Width: 0.4 ms
Lead Channel Setting Pacing Pulse Width: 1 ms
Lead Channel Setting Sensing Sensitivity: 1.2 mV

## 2022-01-31 NOTE — Progress Notes (Signed)
Remote pacemaker transmission.   

## 2022-04-15 ENCOUNTER — Ambulatory Visit
Payer: No Typology Code available for payment source | Attending: Cardiovascular Disease | Admitting: Cardiovascular Disease

## 2022-04-22 ENCOUNTER — Encounter: Payer: Self-pay | Admitting: Cardiovascular Disease

## 2022-04-25 ENCOUNTER — Ambulatory Visit (INDEPENDENT_AMBULATORY_CARE_PROVIDER_SITE_OTHER): Payer: Medicare Other

## 2022-04-25 DIAGNOSIS — I428 Other cardiomyopathies: Secondary | ICD-10-CM | POA: Diagnosis not present

## 2022-04-25 LAB — CUP PACEART REMOTE DEVICE CHECK
Battery Remaining Longevity: 62 mo
Battery Voltage: 2.98 V
Brady Statistic AP VP Percent: 0 %
Brady Statistic AP VS Percent: 0 %
Brady Statistic AS VP Percent: 99.76 %
Brady Statistic AS VS Percent: 0.24 %
Brady Statistic RA Percent Paced: 0 %
Brady Statistic RV Percent Paced: 99.76 %
Date Time Interrogation Session: 20231221191059
Implantable Lead Connection Status: 753985
Implantable Lead Connection Status: 753985
Implantable Lead Implant Date: 19971124
Implantable Lead Implant Date: 20070831
Implantable Lead Location: 753858
Implantable Lead Location: 753860
Implantable Lead Model: 4024
Implantable Lead Model: 4194
Implantable Pulse Generator Implant Date: 20220214
Lead Channel Impedance Value: 3363 Ohm
Lead Channel Impedance Value: 3363 Ohm
Lead Channel Impedance Value: 380 Ohm
Lead Channel Impedance Value: 399 Ohm
Lead Channel Impedance Value: 418 Ohm
Lead Channel Impedance Value: 456 Ohm
Lead Channel Impedance Value: 551 Ohm
Lead Channel Impedance Value: 589 Ohm
Lead Channel Impedance Value: 779 Ohm
Lead Channel Pacing Threshold Amplitude: 0.625 V
Lead Channel Pacing Threshold Amplitude: 2.125 V
Lead Channel Pacing Threshold Pulse Width: 0.4 ms
Lead Channel Pacing Threshold Pulse Width: 1 ms
Lead Channel Sensing Intrinsic Amplitude: 0.25 mV
Lead Channel Sensing Intrinsic Amplitude: 0.25 mV
Lead Channel Setting Pacing Amplitude: 2.5 V
Lead Channel Setting Pacing Amplitude: 3.25 V
Lead Channel Setting Pacing Pulse Width: 0.4 ms
Lead Channel Setting Pacing Pulse Width: 1 ms
Lead Channel Setting Sensing Sensitivity: 1.2 mV
Zone Setting Status: 755011

## 2022-05-15 NOTE — Progress Notes (Signed)
Remote pacemaker transmission.   

## 2022-05-16 ENCOUNTER — Telehealth: Payer: Self-pay | Admitting: Cardiovascular Disease

## 2022-05-16 NOTE — Telephone Encounter (Signed)
That is fine for her to use.

## 2022-05-16 NOTE — Telephone Encounter (Signed)
I think that would be just fine.

## 2022-05-16 NOTE — Telephone Encounter (Signed)
Patient informed it is ok to use the bathing product.

## 2022-05-16 NOTE — Telephone Encounter (Signed)
Patient stated that due to her "handicap" she wants to use this product for bathing. Kimberly [name]. She wants to make sure the ingredients will not conflict with her health.

## 2022-05-16 NOTE — Telephone Encounter (Signed)
Patient is interested in using [employer], but has concerns due to the harsh ingredients. She would like to know if Dr. Sallyanne Kuster thinks this is alright for her to use. She also provided a phone number for their company, [phone].

## 2022-05-23 ENCOUNTER — Telehealth: Payer: Self-pay | Admitting: Cardiovascular Disease

## 2022-05-23 NOTE — Telephone Encounter (Signed)
Called patient, she states that she wanted to know what she could take for heartburn.   Patient advised on her medication list: Prilosec OTC, Protonix was there.however she is not taking these. She states she can take the Prilosec (omeprazole). She states she will do this.   Patient verbalized understanding.

## 2022-05-23 NOTE — Telephone Encounter (Signed)
Patient has heartburn she wants to know what she can take for it.

## 2022-05-23 NOTE — Telephone Encounter (Signed)
Spoke with pt regarding using theraflu. Looked up ingredients, phenylephrine is one of the ingredients, instructed pt that we advise against this in pts with high blood pressure. Pt verbalizes understanding.

## 2022-05-23 NOTE — Telephone Encounter (Signed)
Pt wants to know if it is okay for her to take theraflu with her other medications.

## 2022-05-30 ENCOUNTER — Telehealth: Payer: Self-pay | Admitting: Cardiovascular Disease

## 2022-05-30 NOTE — Telephone Encounter (Signed)
Patient is aware 

## 2022-05-30 NOTE — Telephone Encounter (Signed)
Patient calling to see if its okay for Cefuroxime Axetil '250mg'$  1 table 2x a day for 5 days. Please advise

## 2022-05-30 NOTE — Telephone Encounter (Signed)
Called patient she states she has urinary tract infection. The medication was prescribed  Will send for review and response

## 2022-05-30 NOTE — Telephone Encounter (Signed)
She should not use her Pepcid while she's on cefuroxime as it makes her antibiotic less effective. No other drug interactions noted, ok for her to take.

## 2022-06-04 ENCOUNTER — Emergency Department (HOSPITAL_COMMUNITY): Payer: No Typology Code available for payment source

## 2022-06-04 ENCOUNTER — Encounter (HOSPITAL_COMMUNITY): Payer: Self-pay | Admitting: Emergency Medicine

## 2022-06-04 ENCOUNTER — Other Ambulatory Visit: Payer: Self-pay

## 2022-06-04 ENCOUNTER — Emergency Department (HOSPITAL_COMMUNITY)
Admission: EM | Admit: 2022-06-04 | Discharge: 2022-06-04 | Disposition: A | Discharge status: Home / Self Care | Payer: No Typology Code available for payment source | Attending: Emergency Medicine | Admitting: Emergency Medicine

## 2022-06-04 DIAGNOSIS — I4891 Unspecified atrial fibrillation: Secondary | ICD-10-CM | POA: Diagnosis not present

## 2022-06-04 DIAGNOSIS — R0602 Shortness of breath: Secondary | ICD-10-CM | POA: Insufficient documentation

## 2022-06-04 DIAGNOSIS — N183 Chronic kidney disease, stage 3 unspecified: Secondary | ICD-10-CM | POA: Diagnosis not present

## 2022-06-04 DIAGNOSIS — I129 Hypertensive chronic kidney disease with stage 1 through stage 4 chronic kidney disease, or unspecified chronic kidney disease: Secondary | ICD-10-CM | POA: Diagnosis not present

## 2022-06-04 LAB — CBC WITH DIFFERENTIAL/PLATELET
Abs Immature Granulocytes: 0.08 10*3/uL — ABNORMAL HIGH (ref 0.00–0.07)
Basophils Absolute: 0 10*3/uL (ref 0.0–0.1)
Basophils Relative: 0 %
Eosinophils Absolute: 0.1 10*3/uL (ref 0.0–0.5)
Eosinophils Relative: 1 %
HCT: 35.2 % — ABNORMAL LOW (ref 36.0–46.0)
Hemoglobin: 11.4 g/dL — ABNORMAL LOW (ref 12.0–15.0)
Immature Granulocytes: 1 %
Lymphocytes Relative: 18 %
Lymphs Abs: 1.3 10*3/uL (ref 0.7–4.0)
MCH: 31.8 pg (ref 26.0–34.0)
MCHC: 32.4 g/dL (ref 30.0–36.0)
MCV: 98.3 fL (ref 80.0–100.0)
Monocytes Absolute: 0.7 10*3/uL (ref 0.1–1.0)
Monocytes Relative: 11 %
Neutro Abs: 4.8 10*3/uL (ref 1.7–7.7)
Neutrophils Relative %: 69 %
Platelets: 169 10*3/uL (ref 150–400)
RBC: 3.58 MIL/uL — ABNORMAL LOW (ref 3.87–5.11)
RDW: 16.1 % — ABNORMAL HIGH (ref 11.5–15.5)
WBC: 7 10*3/uL (ref 4.0–10.5)
nRBC: 0 % (ref 0.0–0.2)

## 2022-06-04 LAB — BASIC METABOLIC PANEL
Anion gap: 11 (ref 5–15)
BUN: 22 mg/dL (ref 8–23)
CO2: 21 mmol/L — ABNORMAL LOW (ref 22–32)
Calcium: 8.4 mg/dL — ABNORMAL LOW (ref 8.9–10.3)
Chloride: 102 mmol/L (ref 98–111)
Creatinine, Ser: 1.33 mg/dL — ABNORMAL HIGH (ref 0.44–1.00)
GFR, Estimated: 37 mL/min — ABNORMAL LOW (ref >60)
Glucose, Bld: 102 mg/dL — ABNORMAL HIGH (ref 70–99)
Potassium: 4.6 mmol/L (ref 3.5–5.1)
Sodium: 134 mmol/L — ABNORMAL LOW (ref 135–145)

## 2022-06-04 LAB — BRAIN NATRIURETIC PEPTIDE: B Natriuretic Peptide: 316 pg/mL — ABNORMAL HIGH (ref 0.0–100.0)

## 2022-06-04 LAB — TROPONIN I (HIGH SENSITIVITY)
Troponin I (High Sensitivity): 10 ng/L (ref <18)
Troponin I (High Sensitivity): 9 ng/L (ref <18)

## 2022-06-04 NOTE — ED Provider Notes (Signed)
[facility] Provider Note   CSN: [id number] Arrival date & time: 06/04/22  0014     History  Chief Complaint  Patient presents with   Pain    Kimberly [name] is a 89 y.o. female.  HPI     This is a 89 year old female with a history of atrial fibrillation, hypertension, chronic kidney disease who presents with shortness of breath.  Initially she told nursing that she was having ongoing leg and chest pain which was unchanged from her prior.  However, she tells me that she had worsening shortness of breath tonight.  She states that she suddenly became short of breath and lightheaded.  She states she has ongoing chest discomfort that is unchanged from prior.  She also has some lower extremity pain which is unchanged.  No increased swelling in the lower extremities.  Denies fevers or cough.  No recent illnesses.  Patient lives alone.  Home Medications Prior to Admission medications   Medication Sig Start Date End Date Taking? Authorizing Provider  acetaminophen (TYLENOL) 325 MG tablet Take 325 mg by mouth every 4 (four) hours as needed for headache.    [provider]  carvedilol (COREG) 6.25 MG tablet Take 1 tablet (6.25 mg total) by mouth 2 (two) times daily with a meal. 11/08/21   Croitoru, Mihai, MD  clindamycin (CLEOCIN) 150 MG capsule     [provider]  dicyclomine (BENTYL) 10 MG capsule Take 1 capsule (10 mg total) by mouth 3 (three) times daily before meals. 05/24/21   Triplett, Tammy, PA-C  famotidine (PEPCID) 40 MG tablet Take 1 tablet by mouth daily.    [provider]  furosemide (LASIX) 20 MG tablet Take 1 tablet (20 mg total) by mouth daily as needed for edema (for worsening leg swelling or shortness of breath). 10/24/21   Arrien, Jimmy Picket, MD  mirtazapine (REMERON) 30 MG tablet     [provider]  Multiple Vitamins-Minerals (PRESERVISION AREDS 2) CAPS Take 1 capsule by mouth 2 (two) times  daily.  Patient not taking: Reported on 12/04/2021    [provider]  omeprazole (PRILOSEC OTC) 20 MG tablet 20 mg by oral route. 10/17/18   [provider]  ondansetron (ZOFRAN-ODT) 4 MG disintegrating tablet Take 4 mg by mouth every 8 (eight) hours as needed for nausea or vomiting. 09/24/21   [provider]  pantoprazole (PROTONIX) 40 MG tablet Take 1 tablet (40 mg total) by mouth 2 (two) times daily before a meal. 10/03/21   Danford, Suann Larry, MD  PARoxetine (PAXIL) 20 MG tablet     [provider]  Polyethyl Glycol-Propyl Glycol (SYSTANE) 0.4-0.3 % SOLN Apply 1 drop to eye daily as needed (dry eyes).    [provider]  Polyethylene Glycol 400 (BLINK TEARS) 0.25 % SOLN Apply 1 drop to eye daily.    [provider]  potassium chloride (KLOR-CON M) 10 MEQ tablet Take 1 tablet (10 mEq total) by mouth daily as needed (take with furosemide). 10/24/21   Arrien, Jimmy Picket, MD  QUEtiapine (SEROQUEL) 100 MG tablet     [provider]  tiZANidine (ZANAFLEX) 4 MG tablet Take 1 tablet (4 mg total) by mouth every 6 (six) hours as needed (for headaches). 10/03/21   Danford, Suann Larry, MD  traMADol (ULTRAM) 50 MG tablet     [provider]  traZODone (DESYREL) 50 MG tablet Take 25-50 mg by mouth at bedtime. 11/18/21  [provider]  zolpidem (AMBIEN) 5 MG tablet Take 1 tablet (5 mg total) by mouth at bedtime. 10/03/21   Danford, Suann Larry, MD      Allergies    Ciprofloxacin, Flagyl [metronidazole], Papaya derivatives, Iodine, Penicillins, and Sulfa antibiotics    Review of Systems   Review of Systems  Constitutional:  Negative for fever.  Respiratory:  Positive for shortness of breath. Negative for cough.   Cardiovascular:  Positive for chest pain. Negative for leg swelling.  All other systems reviewed and are negative.   Physical Exam Updated Vital Signs BP (!) 178/78   Pulse 62   Temp 98.1 F (36.7 C)  (Oral)   Resp 18   Ht 1.727 m ('5\' 8"'$ )   Wt 57.7 kg   SpO2 99%   BMI 19.34 kg/m  Physical Exam Vitals and nursing note reviewed.  Constitutional:      Appearance: She is well-developed.     Comments: Elderly, non-ill-appearing, no acute distress  HENT:     Head: Normocephalic and atraumatic.     Mouth/Throat:     Mouth: Mucous membranes are moist.  Eyes:     Pupils: Pupils are equal, round, and reactive to light.  Cardiovascular:     Rate and Rhythm: Normal rate and regular rhythm.     Heart sounds: Normal heart sounds.     Comments: Pacer palpated upper chest Pulmonary:     Effort: Pulmonary effort is normal. No respiratory distress.     Breath sounds: No wheezing.  Abdominal:     General: Bowel sounds are normal.     Palpations: Abdomen is soft.  Musculoskeletal:     Cervical back: Neck supple.     Comments: Trace bilateral lower extremity edema  Skin:    General: Skin is warm and dry.  Neurological:     Mental Status: She is alert and oriented to person, place, and time.     ED Results / Procedures / Treatments   Labs (all labs ordered are listed, but only abnormal results are displayed) Labs Reviewed  CBC WITH DIFFERENTIAL/PLATELET - Abnormal; Notable for the following components:      Result Value   RBC 3.58 (*)    Hemoglobin 11.4 (*)    HCT 35.2 (*)    RDW 16.1 (*)    Abs Immature Granulocytes 0.08 (*)    All other components within normal limits  BASIC METABOLIC PANEL - Abnormal; Notable for the following components:   Sodium 134 (*)    CO2 21 (*)    Glucose, Bld 102 (*)    Creatinine, Ser 1.33 (*)    Calcium 8.4 (*)    GFR, Estimated 37 (*)    All other components within normal limits  BRAIN NATRIURETIC PEPTIDE - Abnormal; Notable for the following components:   B Natriuretic Peptide 316.0 (*)    All other components within normal limits  TROPONIN I (HIGH SENSITIVITY)  TROPONIN I (HIGH SENSITIVITY)    EKG EKG  Interpretation  Date/Time:  Wednesday June 04 2022 00:21:24 EST Ventricular Rate:  63 PR Interval:    QRS Duration: 138 QT Interval:  471 QTC Calculation: 483 R Axis:   84 Text Interpretation: VENTRICULAR PACED RHYTHM Baseline wander in lead(s) I III aVR aVL No significant change since last tracing Confirmed by Thayer Jew [phone]) on 06/04/2022 1:03:16 AM  Radiology DG Chest Portable 1 View  Result Date: 06/04/2022 CLINICAL DATA:  Chronic chest pain EXAM: PORTABLE CHEST 1 VIEW COMPARISON:  09/19/2021 FINDINGS: Left chest wall ICD. Stable cardiomegaly. Aortic atherosclerotic calcification. Chronic bronchitic changes. Left basilar atelectasis/scarring. Biapical pleural-parenchymal scarring and calcification. No pleural effusion or pneumothorax. Cervical spine fusion hardware. No definite displaced rib fractures. Demineralization. IMPRESSION: No acute radiographic abnormality. Electronically Signed   By: Placido Sou M.D.   On: 06/04/2022 00:57    Procedures Procedures    Medications Ordered in ED Medications - No data to display  ED Course/ Medical Decision Making/ A&P                             Medical Decision Making Amount and/or Complexity of Data Reviewed Labs: ordered. Radiology: ordered.   This patient presents to the ED for concern of shortness of breath, leg pain, this involves an extensive number of treatment options, and is a complaint that carries with it a high risk of complications and morbidity.  I considered the following differential and admission for this acute, potentially life threatening condition.  The differential diagnosis includes pulmonary edema, pneumonia, pneumothorax, atypical ACS, less likely PE  MDM:    This is a 89 year old female who presents with shortness of breath.  Had complaints of chronic leg pain and chest pain for nursing.  She is overall nontoxic.  Vital signs notable for blood pressure of 165/80.  She has trace bilateral lower  extremity edema.  Pulmonary exam is normal.  EKG shows no acute ischemic changes.  She is in a paced rhythm.  Labs obtained and reviewed.  Largely reassuring including troponin x 2.  She is not hypoxic and COVID and influenza testing are negative.  She is not significantly tachycardic.  Doubt PE.  On recheck, patient states that she feels at her baseline.  (Labs, imaging, consults)  Labs: I Ordered, and personally interpreted labs.  The pertinent results include: CBC, BMP, BNP, troponin x 2  Imaging Studies ordered: I ordered imaging studies including chest x-ray I independently visualized and interpreted imaging. I agree with the radiologist interpretation  Additional history obtained from chart review.  External records from outside source obtained and reviewed including prior evaluation  Cardiac Monitoring: The patient was maintained on a cardiac monitor.  I personally viewed and interpreted the cardiac monitored which showed an underlying rhythm of: Paced rhythm  Reevaluation: After the interventions noted above, I reevaluated the patient and found that they have :improved  Social Determinants of Health:  lives independently  Disposition: Discharge  Co morbidities that complicate the patient evaluation  Past Medical History:  Diagnosis Date   Acute blood loss anemia 10/11/2012   Acute diverticulitis 08/24/2013   Acute on chronic combined systolic and diastolic CHF, NYHA class 4 ([place] [facility]) 11/15/2013   Antral ulcer 10/11/2012   Aortic atherosclerosis ([place])    Arrhythmia    atrial fibb   Atrial fibrillation ([place])    Bipolar affective disorder ([place]) 03/23/2017   Cardiomyopathy, nonischemic ([facility])    Chronic anticoagulation 10/12/2012   CKD (chronic kidney disease) stage 3, GFR 30-59 ml/min ([facility]) 10/12/2012   Depression    Diverticulosis    DVT (deep venous thrombosis) ([place])    left peroneal veins/IVC Filter placed as per hospital discharge 10/24/21   Erosive esophagitis  10/11/2012   Esophagitis    Fibromyalgia    Glaucoma    H/O echocardiogram 2007   EF 40-45%,          Hiatal hernia    History of anemia due to CKD  Hypertension    Internal hemorrhoids    Nephrolithiasis    Osteoarthritis    Pacemaker    Last saw cards 07/2013   Prepyloric ulcer    Scoliosis    Tubular adenoma of colon      Medicines No orders of the defined types were placed in this encounter.   I have reviewed the patients home medicines and have made adjustments as needed  Problem List / ED Course: Problem List Items Addressed This Visit       Other   Dyspnea - Primary                Final Clinical Impression(s) / ED Diagnoses Final diagnoses:  Shortness of breath    Rx / DC Orders ED Discharge Orders     None         Merryl Hacker, MD 06/04/22 [id number]

## 2022-06-04 NOTE — ED Triage Notes (Signed)
Pt BIB [facility] from home c/o chronic leg pain and chronic chest pain. Pt states the pain is no different than her usual pain.   Pt refused IV, nitro, ASA, and O2 by EMS.

## 2022-06-04 NOTE — Discharge Instructions (Signed)
You were seen today for shortness of breath.  Your workup is reassuring.  Follow-up with your primary doctor.

## 2022-06-20 ENCOUNTER — Encounter: Payer: Self-pay | Admitting: Nurse Practitioner

## 2022-06-24 ENCOUNTER — Telehealth: Payer: Self-pay | Admitting: Cardiovascular Disease

## 2022-06-24 NOTE — Telephone Encounter (Signed)
Call pt and made her aware that once the Dentist comes up with a plan, they will fax [place] over a surgical clearance over.  Pt thanked me for the call.

## 2022-06-24 NOTE — Telephone Encounter (Signed)
Placed call to [facility].  They haven't came up with a procedure plan as of yet, as pt switched up with what she wanted to do.   They have been advised once the Dentist comes up with a procedure plan, they can fax the clearance over with Type of procedure, how many extractions, if any, etc.  Fax number provided.

## 2022-06-24 NOTE — Telephone Encounter (Signed)
Patient wants clearance to have teeth extracted.  Patient stated [facility], ph# [phone] is her dental office.

## 2022-06-28 ENCOUNTER — Telehealth: Payer: Self-pay | Admitting: Physician Assistant

## 2022-06-28 NOTE — Telephone Encounter (Signed)
Patient fell several nights ago and was given Voltaren gel to use for the pain.  She has multiple areas of soft tissue injury.  In reading the label, she noticed that it began contain sodium and she was concerned about that.  Advised that the sodium in the gel is not well absorbed into her bloodstream and it should not give her a problem with excess sodium.  If she has any side effects, she should contact her PCP.  The fall was a mechanical fall, and not related to any changes in her level of consciousness.  No other cardiac issues at present.  Rosaria Ferries, PA-C 06/28/2022 10:38 AM

## 2022-07-25 ENCOUNTER — Ambulatory Visit (INDEPENDENT_AMBULATORY_CARE_PROVIDER_SITE_OTHER): Payer: Medicare Other

## 2022-07-25 DIAGNOSIS — I428 Other cardiomyopathies: Secondary | ICD-10-CM

## 2022-07-25 LAB — CUP PACEART REMOTE DEVICE CHECK
Battery Remaining Longevity: 65 mo
Battery Voltage: 2.98 V
Brady Statistic AP VP Percent: 0 %
Brady Statistic AP VS Percent: 0 %
Brady Statistic AS VP Percent: 99.55 %
Brady Statistic AS VS Percent: 0.45 %
Brady Statistic RA Percent Paced: 0 %
Brady Statistic RV Percent Paced: 99.55 %
Date Time Interrogation Session: 20240322052640
Implantable Lead Connection Status: 753985
Implantable Lead Connection Status: 753985
Implantable Lead Implant Date: 19971124
Implantable Lead Implant Date: 20070831
Implantable Lead Location: 753858
Implantable Lead Location: 753860
Implantable Lead Model: 4024
Implantable Lead Model: 4194
Implantable Pulse Generator Implant Date: 20220214
Lead Channel Impedance Value: 3363 Ohm
Lead Channel Impedance Value: 3363 Ohm
Lead Channel Impedance Value: 399 Ohm
Lead Channel Impedance Value: 437 Ohm
Lead Channel Impedance Value: 437 Ohm
Lead Channel Impedance Value: 513 Ohm
Lead Channel Impedance Value: 589 Ohm
Lead Channel Impedance Value: 646 Ohm
Lead Channel Impedance Value: 855 Ohm
Lead Channel Pacing Threshold Amplitude: 0.625 V
Lead Channel Pacing Threshold Amplitude: 1.875 V
Lead Channel Pacing Threshold Pulse Width: 0.4 ms
Lead Channel Pacing Threshold Pulse Width: 1 ms
Lead Channel Sensing Intrinsic Amplitude: 0.25 mV
Lead Channel Sensing Intrinsic Amplitude: 0.25 mV
Lead Channel Setting Pacing Amplitude: 2.5 V
Lead Channel Setting Pacing Amplitude: 3 V
Lead Channel Setting Pacing Pulse Width: 0.4 ms
Lead Channel Setting Pacing Pulse Width: 1 ms
Lead Channel Setting Sensing Sensitivity: 1.2 mV
Zone Setting Status: 755011

## 2022-08-26 NOTE — Progress Notes (Signed)
Remote pacemaker transmission.   

## 2022-08-31 NOTE — Progress Notes (Unsigned)
Cardiology Office Note    Date:  09/02/2022   ID:  Kimberly [name], DOB [birth year], MRN [id number]  PCP:  Rebekah Chesterfield, NP  Cardiologist:   Thurmon Fair, MD   Chief Complaint  Patient presents with   Congestive Heart Failure         History of Present Illness:  Kimberly [name] is a 89 y.o. female with a history of permanent atrial fibrillation with AV node ablation and secondary complete heart block here for follow-up on her CRT-P device (generator change 06/19/2020). She has mild diastolic heart failure (last LVEF 55-60% by echo 11/21/2019).  She is feeling well and does not have any current complaints of shortness of breath or edema, although on exam she has some trace ankle swelling.  She denies palpitations, dizziness or syncope and has never complained of chest discomfort.  No orthopnea or PND.  She has not had any focal neurological events.  Denies falls or bleeding.  Review of the computerized medical record does show evidence of emergency room visits for a fall that occurred in early February.  She thinks she simply rolled over and fell off the couch when she was asleep.  A few days later she went to the emergency room in [place] after a fall that occurred when her walker got stuck on the carpet.  Thankfully there were no serious injuries on either occasion.  She is no longer taking Eliquis.  It appears that this was inadvertently discontinued at the time of her most recent hospitalization in June 2023.  Her 2022 biventricular pacemaker (Medtronic Percepta CRT-P), shows 99.6% biventricular pacing.  .  Her current generator was implanted in 2022 (right ventricular lead from 1997, coronary sinus lead from 2007).  The atrial port is pinned.  programmed VVIR with LV first.  Both right ventricular and left ventricular pacing thresholds are on automatic capture and stable.  There have been no episodes of high ventricular rate.  The thoracic impedance has been decreased for the last 3  weeks or so suggesting volume overload.  Her most recent echocardiogram performed July 2021 was unchanged from 2016 and showed left ventricular ejection fraction 60-65 %, severe biatrial dilatation, minor valvular abnormalities, peak PA systolic pressure 38 mmHg.  [name] has a long-standing history of permanent atrial fibrillation s/p AV node ablation, complete heart block and nonischemic cardiomyopathy with an excellent response to cardiac resynchronization therapy. She originally had a CRT-D device but at the time of generator change out was "downgraded" to a CRT-P. since her left ventricular ejection fraction increased to 50-55%. Her current device is a Art therapist CRT-P [id number] implanted in June 2012. Her defibrillator lead, which was a Sprint fidelis lead is capped and an older RV pacemaker lead Medtronic [id number] implanted 1997 is being used. The left ventricular lead is a Medtronic [id number] implanted in 2007. She has long-standing problems with chronic scoliosis and fibromyalgia.  In December 2015 she was hospitalized in [place] and received 2 units of blood transfusion. No bleeding events since that time. Hospitalized in January 2016 after a fall at home,  Possibly precipitated by dehydration complicated by acute on chronic renal failure.    Past Medical History:  Diagnosis Date   Acute blood loss anemia 10/11/2012   Acute diverticulitis 08/24/2013   Acute on chronic combined systolic and diastolic CHF, NYHA class 4 (HCC) 11/15/2013   Antral ulcer 10/11/2012   Aortic atherosclerosis (HCC)    Atrial fibrillation (HCC)    Bipolar affective  disorder (HCC) 03/23/2017   Cardiomyopathy, nonischemic (HCC)    Chronic anticoagulation 10/12/2012   CKD (chronic kidney disease) stage 3, GFR 30-59 ml/min (HCC) 10/12/2012   Depression    Diverticulosis    DVT (deep venous thrombosis) (HCC)    left peroneal veins/IVC Filter placed as per hospital discharge 10/24/21   Erosive esophagitis 10/11/2012    Esophagitis    Fibromyalgia    Glaucoma    H/O echocardiogram 2007   EF 40-45%,          Hiatal hernia    History of anemia due to CKD    Hypertension    Internal hemorrhoids    Nephrolithiasis    Osteoarthritis    Pacemaker    Last saw cards 07/2013   Prepyloric ulcer    Scoliosis    Tubular adenoma of colon     Past Surgical History:  Procedure Laterality Date   ABDOMINAL HYSTERECTOMY     APPENDECTOMY     BACK SURGERY     BIOPSY  03/03/2017   Procedure: BIOPSY;  Surgeon: West Bali, MD;  Location: [facility];  Service: Endoscopy;;  gastric   BREAST SURGERY     CARDIAC CATHETERIZATION  12/08/2005   LAD AND LEFT MAIN WITH NO HIGH-GRADE STENOSIS. MILD DISEASE IN THE CX AND LAD SYSTEM. SEVERE LV DYSFUNCTION WITH DILATION OF THE LV. EF 15-20%. LV END-DIASTOLIC PRESSURE IS 90. +1 MR.   CHOLECYSTECTOMY     COLONOSCOPY N/A 03/03/2017   Dr. Darrick Penna: 5 colon polyps removed, adenomatous.  Diverticulosis.  99% of the colon was cleared but the cecum was not adequately seen.   CYSTOSCOPY N/A 02/24/2013   Procedure: CYSTOSCOPY WITH URETHRAL DILITATION;  Surgeon: Ky Barban, MD;  Location: [facility];  Service: Urology;  Laterality: N/A;   DOPPLER ECHOCARDIOGRAPHY N/A 05/30/2010   LV SIZE IS NORMAL. LV SYSTOLIC FUNCTION IS LOW NORMAL. EF=50-55%. MILD INFERIOR HYPOKINESIS.MILD TO MODERATE POSTERIOR WALL HYPOKINESIS.PACEMAKER LEAD IN THE RV. LA IS MILDLY DILATED. RA IS MODERATE TO SEVERLY DILATED. PACEMAKER LEAD IN THE RA. MILD CALCICICATION OF THE MV APPARATUS. MODERATE MR. MILD TO MODERATE TR. MILD PHTN.AV MILDLY SCLEROTIC.   ESOPHAGOGASTRODUODENOSCOPY N/A 10/13/2012   Dr. Jena Gauss: severe ulcerative reflux esophagitis, question of Barrett's but negative path, single deep prepyloric antral ulcer, negative H.pylori   ESOPHAGOGASTRODUODENOSCOPY N/A 03/03/2017   Dr. Darrick Penna: Gastritis/duodenitis, no H. pylori   ESOPHAGOGASTRODUODENOSCOPY (EGD) WITH PROPOFOL N/A 10/01/2021   Procedure:  ESOPHAGOGASTRODUODENOSCOPY (EGD) WITH PROPOFOL;  Surgeon: Beverley Fiedler, MD;  Location: [facility] ENDOSCOPY;  Service: Gastroenterology;  Laterality: N/A;   HERNIA REPAIR     right inguinal hernia and umbilical   LOWETR EXT VENOUS Bilateral 11-08-10   R & L- NO EVIDENCE OF THROMBUS OR THROMBOPHLEBITIS. THERE IS MILD AMOUNT OF SUBCUTANEOUS EDEMA NOTED WITHIN THE LEFT CALF AND ANKLE. R & L GSV AND SSV- NO VENOUS INSUFF NOTED.   NECK SURGERY     NUCLEAR STRESS TEST N/A 02/13/2009   NORMAL PATTERN OF PERFUSION IN ALL REGIONS. POST STRESS VENTICULAR SIZE IS NORMAL. POST  STESS EF 85%.  NORMAL MYOCARDIAL PERFUSION STUDY.   OPEN REDUCTION INTERNAL FIXATION (ORIF) DISTAL PHALANX Left 11/16/2018   Procedure: MIDDLE FINGER OPEN REDUCTION VERSUS RECONSTRUCTION;  Surgeon: Dominica Severin, MD;  Location: [facility] OR;  Service: Orthopedics;  Laterality: Left;   PACEMAKER INSERTION     POLYPECTOMY  03/03/2017   Procedure: POLYPECTOMY;  Surgeon: West Bali, MD;  Location: [facility];  Service: Endoscopy;;  colon  PPM GENERATOR CHANGEOUT N/A 06/18/2020   Procedure: PPM GENERATOR CHANGEOUT;  Surgeon: Thurmon Fair, MD;  Location: [facility] INVAS[facility] CV LAB;  Service: Cardiovascular;  Laterality: N/A;   TONSILLECTOMY     YAG LASER APPLICATION Bilateral 11/20/2014   Procedure: YAG LASER APPLICATION;  Surgeon: Susa Simmonds, MD;  Location: [facility];  Service: Ophthalmology;  Laterality: Bilateral;    Current Medications: Outpatient Medications Prior to Visit  Medication Sig Dispense Refill   carvedilol (COREG) 6.25 MG tablet Take 1 tablet (6.25 mg total) by mouth 2 (two) times daily with a meal. 180 tablet 3   clindamycin (CLEOCIN) 150 MG capsule Take 150 mg by mouth daily in the afternoon.     dicyclomine (BENTYL) 10 MG capsule Take 1 capsule (10 mg total) by mouth 3 (three) times daily before meals. 21 capsule 0   furosemide (LASIX) 20 MG tablet Take 1 tablet (20 mg total) by mouth daily as needed for edema (for worsening  leg swelling or shortness of breath). 30 tablet 0   mirtazapine (REMERON) 30 MG tablet Take 30 mg by mouth at bedtime.     Multiple Vitamins-Minerals (PRESERVISION AREDS 2) CAPS Take 1 capsule by mouth 2 (two) times daily.     omeprazole (PRILOSEC OTC) 20 MG tablet 20 mg daily as needed.     Polyethyl Glycol-Propyl Glycol (SYSTANE) 0.4-0.3 % SOLN Apply 1 drop to eye daily as needed (dry eyes).     Polyethylene Glycol 400 (BLINK TEARS) 0.25 % SOLN Apply 1 drop to eye daily.     potassium chloride (KLOR-CON M) 10 MEQ tablet Take 1 tablet (10 mEq total) by mouth daily as needed (take with furosemide). 30 tablet 0   tiZANidine (ZANAFLEX) 4 MG tablet Take 1 tablet (4 mg total) by mouth every 6 (six) hours as needed (for headaches). 30 tablet 0   zolpidem (AMBIEN) 5 MG tablet Take 1 tablet (5 mg total) by mouth at bedtime. 12 tablet 0   acetaminophen (TYLENOL) 325 MG tablet Take 325 mg by mouth every 4 (four) hours as needed for headache. (Patient not taking: Reported on 09/01/2022)     famotidine (PEPCID) 40 MG tablet Take 1 tablet by mouth daily. (Patient not taking: Reported on 09/01/2022)     ondansetron (ZOFRAN-ODT) 4 MG disintegrating tablet Take 4 mg by mouth every 8 (eight) hours as needed for nausea or vomiting. (Patient not taking: Reported on 09/01/2022)     pantoprazole (PROTONIX) 40 MG tablet Take 1 tablet (40 mg total) by mouth 2 (two) times daily before a meal. (Patient not taking: Reported on 09/01/2022)     PARoxetine (PAXIL) 20 MG tablet  (Patient not taking: Reported on 09/01/2022)     QUEtiapine (SEROQUEL) 100 MG tablet  (Patient not taking: Reported on 09/01/2022)     traMADol (ULTRAM) 50 MG tablet  (Patient not taking: Reported on 09/01/2022)     traZODone (DESYREL) 50 MG tablet Take 25-50 mg by mouth at bedtime. (Patient not taking: Reported on 09/01/2022)     No facility-administered medications prior to visit.     Allergies:   Ciprofloxacin, Flagyl [metronidazole], Papaya derivatives,  Iodine, Penicillins, and Sulfa antibiotics   Social History   Socioeconomic History   Marital status: Widowed    Spouse name: Not on file   Number of children: Not on file   Years of education: Not on file   Highest education level: Some college, no degree  Occupational History   Occupation: Public librarian  Employer: RETIRED    Comment: retired  Tobacco Use   Smoking status: Former    Packs/day: 0.50    Years: 30.00    Additional pack years: 0.00    Total pack years: 15.00    Types: Cigarettes    Quit date: 12/13/2004    Years since quitting: 17.7   Smokeless tobacco: Never   Tobacco comments:    former smoker  Vaping Use   Vaping Use: Never used  Substance and Sexual Activity   Alcohol use: Not Currently    Comment: history of drinking a 1/2 glass of wine in the evening   Drug use: No   Sexual activity: Never  Other Topics Concern   Not on file  Social History Narrative   No home exercise program. PT ordered this week.   Social Determinants of Health   Financial Resource Strain: Low Risk  (06/04/2020)   Overall Financial Resource Strain (CARDIA)    Difficulty of Paying Living Expenses: Not very hard  Food Insecurity: No Food Insecurity (06/04/2020)   Hunger Vital Sign    Worried About Running Out of Food in the Last Year: Never true    Ran Out of Food in the Last Year: Never true  Transportation Needs: Unmet Transportation Needs (06/04/2020)   PRAPARE - Administrator, Civil Service (Medical): Yes    Lack of Transportation (Non-Medical): No  Physical Activity: Inactive (08/15/2019)   Exercise Vital Sign    Days of Exercise per Week: 0 days    Minutes of Exercise per Session: 0 min  Stress: No Stress Concern Present (10/22/2018)   [employer] of Occupational Health - Occupational Stress Questionnaire    Feeling of Stress : Only a little  Social Connections: Unknown (08/15/2019)   Social Connection and Isolation Panel [NHANES]    Frequency of  Communication with Friends and Family: Twice a week    Frequency of Social Gatherings with Friends and Family: Once a week    Attends Religious Services: Never    Database administrator or Organizations: No    Attends Engineer, structural: Not on file    Marital Status: Not on file     Family History:  The patient's family history includes Asthma in her sister; Cancer in her brother and sister; Heart failure in her brother; Pulmonary embolism in her brother.   ROS:   Please see the history of present illness.    All other systems are reviewed and are negative  PHYSICAL EXAM:   VS:  BP 122/80   Pulse 66   Ht 5\' 7"  (1.702 m)   Wt 124 lb 6.4 oz (56.4 kg)   SpO2 98%   BMI 19.48 kg/m      General: Alert, oriented x3, no distress, prominent thoracic scoliosis.  Well-healed left subclavian pacemaker site. Head: no evidence of trauma, PERRL, EOMI, no exophtalmos or lid lag, no myxedema, no xanthelasma; normal ears, nose and oropharynx Neck: 5 cm elevation in jugular venous pulsations and no hepatojugular reflux; brisk carotid pulses without delay and no carotid bruits Chest: clear to auscultation, no signs of consolidation by percussion or palpation, normal fremitus, symmetrical and full respiratory excursions Cardiovascular: normal position and quality of the apical impulse, regular rhythm, normal first and paradoxically split second heart sounds, no murmurs, rubs or gallops Abdomen: no tenderness or distention, no masses by palpation, no abnormal pulsatility or arterial bruits, normal bowel sounds, no hepatosplenomegaly Extremities: Prominent rheumatoid finger and wrist he  formations particularly obvious on the right hand.  2+ edema of the ankles and feet bilaterally. Neurological: grossly nonfocal Psych: Normal mood and affect    Wt Readings from Last 3 Encounters:  09/01/22 124 lb 6.4 oz (56.4 kg)  06/04/22 127 lb 3.3 oz (57.7 kg)  12/04/21 127 lb 3.2 oz (57.7 kg)       Studies/Labs Reviewed:   ECHO 11/21/2019    1. Left ventricular ejection fraction, by estimation, is 55 to 60%. The  left ventricle has normal function. The left ventricle has no regional  wall motion abnormalities. Left ventricular diastolic function could not  be evaluated.   2. Right ventricular systolic function is normal. The right ventricular  size is normal. There is mildly elevated pulmonary artery systolic  pressure. The estimated right ventricular systolic pressure is 37.6 mmHg.   3. Left atrial size was severely dilated.   4. Right atrial size was severely dilated.   5. The mitral valve is degenerative. Mild mitral valve regurgitation. No  evidence of mitral stenosis.   6. The aortic valve is tricuspid. Aortic valve regurgitation is not  visualized. Mild to moderate aortic valve sclerosis/calcification is  present, without any evidence of aortic stenosis.   7. The inferior vena cava is normal in size with greater than 50%  respiratory variability, suggesting right atrial pressure of 3 mmHg.   Comparison(s): No significant change from prior study. Prior EF 60-65%  (2016).   EKG:  EKG is not ordered today.  ECG 06/04/2022 shows atrial fibrillation with biventricular pacing and positive R wave in lead V1. Recent Labs: 10/20/2021: TSH 3.716 10/23/2021: Magnesium 1.7 06/04/2022: B Natriuretic Peptide 316.0; BUN 22; Creatinine, Ser 1.33; Hemoglobin 11.4; Platelets 169; Potassium 4.6; Sodium 134   Lipid Panel    Component Value Date/Time   CHOL 179 03/15/2018 1645   TRIG 65 03/15/2018 1645   HDL 84 03/15/2018 1645   CHOLHDL 2.1 03/15/2018 1645   CHOLHDL 2.3 02/06/2009 0450   VLDL 16 02/06/2009 0450   LDLCALC 82 03/15/2018 1645    ASSESSMENT:    1. Chronic diastolic heart failure (HCC)   2. Permanent atrial fibrillation (HCC)   3. Acquired thrombophilia (HCC)   4. CHB (complete heart block) (HCC)   5. Status post biventricular pacemaker   6. Rheumatoid arthritis  involving both hands, unspecified whether rheumatoid factor present (HCC)   7. Stage 3b chronic kidney disease (HCC)       PLAN:  In order of problems listed above:  CHF: She has no clinical complaints and she has only very mild ankle swelling.  Her pacemaker suggest that she has been fluid overloaded for the last 3 weeks or so.  Will have her perform another download from her pacemaker via the heart failure device clinic in another week or 2.   She is a CRT hyperresponder who has recovered left ventricular systolic function.  AFib s/p AV node ablation: Pacemaker dependent.   Anticoagulation: On lower dose Eliquis due to her advanced age, small body size and volatile renal function.  She has never had a stroke or other embolic events.  No recent documented falls or bleeding problems.  CHA2DS2-VASc 4 (age 75, gender, history of CHF) CHB: Pacemaker dependent after AV node ablation CRT-P: Normal device function.  Continue remote downloads every 3 months. Rheumatoid arthritis: Recurrent episodes of inflammatory exacerbation particularly in the digits of the right hand, currently appears to be quiesced and.  Refuses referral to rheumatology/orthopedic surgery.. CKD 3B: Stable  recent renal function labs.  eks.    Medication Adjustments/Labs and Tests Ordered: Current medicines are reviewed at length with the patient today.  Concerns regarding medicines are outlined above.  Medication changes, Labs and Tests ordered today are listed in the Patient Instructions below. Patient Instructions  Medication Instructions:  Eliquis 2.5 mg twice a day *If you need a refill on your cardiac medications before your next appointment, please call your pharmacy*  Follow-Up: At [facility], you and your health needs are our priority.  As part of our continuing mission to provide you with exceptional heart care, we have created designated Provider Care Teams.  These Care Teams include your primary  Cardiologist (physician) and Advanced Practice Providers (APPs -  Physician Assistants and Nurse Practitioners) who all work together to provide you with the care you need, when you need it.  We recommend signing up for the patient portal called "MyChart".  Sign up information is provided on this After Visit Summary.  MyChart is used to connect with patients for Virtual Visits (Telemedicine).  Patients are able to view lab/test results, encounter notes, upcoming appointments, etc.  Non-urgent messages can be sent to your provider as well.   To learn more about what you can do with MyChart, go to [email or web address].    Your next appointment:   1 year(s)  Provider:   Thurmon Fair, MD       Signed, Thurmon Fair, MD  09/02/2022 2:50 PM    [facility] [address] [place], [place], [place]  [address] Phone: [phone]; Fax: [phone]

## 2022-09-01 ENCOUNTER — Encounter: Payer: Self-pay | Admitting: Cardiovascular Disease

## 2022-09-01 ENCOUNTER — Ambulatory Visit
Payer: No Typology Code available for payment source | Attending: Cardiovascular Disease | Admitting: Cardiovascular Disease

## 2022-09-01 VITALS — BP 122/80 | HR 66 | Ht 67.0 in | Wt 124.4 lb

## 2022-09-01 DIAGNOSIS — Z950 Presence of cardiac pacemaker: Secondary | ICD-10-CM

## 2022-09-01 DIAGNOSIS — N1832 Chronic kidney disease, stage 3b: Secondary | ICD-10-CM

## 2022-09-01 DIAGNOSIS — I5032 Chronic diastolic (congestive) heart failure: Secondary | ICD-10-CM

## 2022-09-01 DIAGNOSIS — D6869 Other thrombophilia: Secondary | ICD-10-CM

## 2022-09-01 DIAGNOSIS — I4821 Permanent atrial fibrillation: Secondary | ICD-10-CM | POA: Diagnosis not present

## 2022-09-01 DIAGNOSIS — I442 Atrioventricular block, complete: Secondary | ICD-10-CM

## 2022-09-01 DIAGNOSIS — M069 Rheumatoid arthritis, unspecified: Secondary | ICD-10-CM

## 2022-09-01 MED ORDER — APIXABAN 2.5 MG PO TABS: 2.5000 mg | ORAL_TABLET | Freq: Two times a day (BID) | ORAL | 0 refills | Status: AC

## 2022-09-01 MED ORDER — APIXABAN 2.5 MG PO TABS: 2.5000 mg | ORAL_TABLET | Freq: Two times a day (BID) | ORAL | 3 refills | Status: DC

## 2022-09-01 NOTE — Patient Instructions (Signed)
Medication Instructions:  Eliquis 2.5 mg twice a day *If you need a refill on your cardiac medications before your next appointment, please call your pharmacy*  Follow-Up: At [facility], you and your health needs are our priority.  As part of our continuing mission to provide you with exceptional heart care, we have created designated Provider Care Teams.  These Care Teams include your primary Cardiologist (physician) and Advanced Practice Providers (APPs -  Physician Assistants and Nurse Practitioners) who all work together to provide you with the care you need, when you need it.  We recommend signing up for the patient portal called "MyChart".  Sign up information is provided on this After Visit Summary.  MyChart is used to connect with patients for Virtual Visits (Telemedicine).  Patients are able to view lab/test results, encounter notes, upcoming appointments, etc.  Non-urgent messages can be sent to your provider as well.   To learn more about what you can do with MyChart, go to [email or web address].    Your next appointment:   1 year(s)  Provider:   Thurmon Fair, MD

## 2022-09-02 ENCOUNTER — Encounter: Payer: Self-pay | Admitting: Cardiovascular Disease

## 2022-09-08 ENCOUNTER — Telehealth: Payer: Self-pay | Admitting: Cardiovascular Disease

## 2022-09-08 NOTE — Telephone Encounter (Signed)
Patient does not understand why she is on this medication (Eliquis).  Advised per the doctors note it was "inadvertently discontinued" at her hospital visit 2023.  He has placed her back on this medication due to her cardiac history.  She states understanding and will continue the mediation.

## 2022-09-08 NOTE — Telephone Encounter (Signed)
Pt c/o medication issue:  1. Name of Medication:   apixaban (ELIQUIS) 2.5 MG TABS tablet   2. How are you currently taking this medication (dosage and times per day)?   Patient stated she had been taken off this medication  3. Are you having a reaction (difficulty breathing--STAT)?  N/A  4. What is your medication issue?   Patient stated she had not been taking this medication and wants to know why she has been put back on this medication.

## 2022-09-15 ENCOUNTER — Ambulatory Visit: Payer: No Typology Code available for payment source | Attending: Cardiovascular Disease

## 2022-09-15 DIAGNOSIS — Z950 Presence of cardiac pacemaker: Secondary | ICD-10-CM | POA: Diagnosis not present

## 2022-09-15 DIAGNOSIS — I5032 Chronic diastolic (congestive) heart failure: Secondary | ICD-10-CM | POA: Diagnosis not present

## 2022-09-16 ENCOUNTER — Telehealth: Payer: Self-pay

## 2022-09-16 NOTE — Telephone Encounter (Signed)
Remote ICM transmission received.  Attempted call to patient regarding ICM remote transmission and no answer.  

## 2022-09-16 NOTE — Progress Notes (Signed)
EPIC Encounter for ICM Monitoring  Patient Name: Kimberly [name] is a 89 y.o. female Date: 09/16/2022 Primary Care Physican: Rebekah Chesterfield, NP Primary Cardiologist: Croitoru Electrophysiologist: Croitoru Bi-V Pacing: 99.6%        ICM check for Dr Royann Shivers following recent OV.   Attempted call to patient and unable to reach.   Transmission reviewed.    Optivol thoracic impedance suggesting possible fluid accumulation starting 4/1.   Fluid index greater than normal threshold starting 4/14.  Prescribed:  Furosemide 20 mg take 1 tablet(s) (20 mg total) by mouth daily as needed for edema (worsening leg swelling or shortness of breath). Potassium 10 mEq take 1 tablet(s) (10 mEq total) by mouth daily as needed (take with Furosemide).  Recommendations: Unable to reach.  Copy send to Dr Royann Shivers as requested following 4/29 OV.  Follow-up plan: No further ICM clinic phone appointment unless requested by Dr Royann Shivers.   91 day device clinic remote transmission 10/24/2022.    EP/Cardiology Office Visits: Recall 08/27/2023 with Dr. Royann Shivers.    Copy of ICM check sent to Dr. Royann Shivers.   3 month ICM trend: 09/14/2022.    12-14 Month ICM trend:     Karie Soda, RN 09/16/2022 8:12 AM

## 2022-10-24 ENCOUNTER — Ambulatory Visit: Payer: Medicare Other

## 2022-10-24 DIAGNOSIS — I442 Atrioventricular block, complete: Secondary | ICD-10-CM | POA: Diagnosis not present

## 2022-10-24 DIAGNOSIS — I4821 Permanent atrial fibrillation: Secondary | ICD-10-CM

## 2022-10-24 LAB — CUP PACEART REMOTE DEVICE CHECK
Battery Remaining Longevity: 67 mo
Battery Voltage: 2.97 V
Brady Statistic AP VP Percent: 0 %
Brady Statistic AP VS Percent: 0 %
Brady Statistic AS VP Percent: 99.62 %
Brady Statistic AS VS Percent: 0.38 %
Brady Statistic RA Percent Paced: 0 %
Brady Statistic RV Percent Paced: 99.61 %
Date Time Interrogation Session: 20240620214805
Implantable Lead Connection Status: 753985
Implantable Lead Connection Status: 753985
Implantable Lead Implant Date: 19971124
Implantable Lead Implant Date: 20070831
Implantable Lead Location: 753858
Implantable Lead Location: 753860
Implantable Lead Model: 4024
Implantable Lead Model: 4194
Implantable Pulse Generator Implant Date: 20220214
Lead Channel Impedance Value: 3363 Ohm
Lead Channel Impedance Value: 3363 Ohm
Lead Channel Impedance Value: 399 Ohm
Lead Channel Impedance Value: 437 Ohm
Lead Channel Impedance Value: 475 Ohm
Lead Channel Impedance Value: 475 Ohm
Lead Channel Impedance Value: 551 Ohm
Lead Channel Impedance Value: 627 Ohm
Lead Channel Impedance Value: 760 Ohm
Lead Channel Pacing Threshold Amplitude: 0.75 V
Lead Channel Pacing Threshold Amplitude: 2.125 V
Lead Channel Pacing Threshold Pulse Width: 0.4 ms
Lead Channel Pacing Threshold Pulse Width: 1 ms
Lead Channel Sensing Intrinsic Amplitude: 0.25 mV
Lead Channel Sensing Intrinsic Amplitude: 0.25 mV
Lead Channel Sensing Intrinsic Amplitude: 8.375 mV
Lead Channel Sensing Intrinsic Amplitude: 8.375 mV
Lead Channel Setting Pacing Amplitude: 2.5 V
Lead Channel Setting Pacing Amplitude: 2.75 V
Lead Channel Setting Pacing Pulse Width: 0.4 ms
Lead Channel Setting Pacing Pulse Width: 1 ms
Lead Channel Setting Sensing Sensitivity: 1.2 mV
Zone Setting Status: 755011

## 2022-11-11 NOTE — Progress Notes (Signed)
Remote pacemaker transmission.   

## 2022-11-12 ENCOUNTER — Telehealth: Payer: Self-pay | Admitting: Cardiovascular Disease

## 2022-11-12 NOTE — Telephone Encounter (Signed)
   Pre-operative Risk Assessment    Patient Name: Kimberly [name]  DOB: 1927/01/17 MRN: [id number]      Request for Surgical Clearance    Procedure:  Dental Extraction - Amount of Teeth to be Pulled:  9  Date of Surgery:  Clearance 11/17/22                                 Surgeon:  Dr. Myer Haff Surgeon's Group or Practice Name:  [facility] Phone number:  [phone] Fax number:  [phone]   Type of Clearance Requested:   - Medical  - Pharmacy:  Hold        Type of Anesthesia:  Local    Additional requests/questions:      SignedFilomena Jungling   11/12/2022, 2:15 PM

## 2022-11-13 NOTE — Telephone Encounter (Signed)
Patient with diagnosis of afib on Eliquis for anticoagulation.    Procedure: extraction of 9 teeth Date of procedure: 11/17/22  Of note patient has hx of a single DVT in 2023   CHA2DS2-VASc Score = 6   This indicates a 9.7% annual risk of stroke. The patient's score is based upon: CHF History: 1 HTN History: 1 Diabetes History: 0 Stroke History: 0 Vascular Disease History: 1 Age Score: 2 Gender Score: 1      CrCl 22 ml/min  Patient does NOT require pre-op antibiotics for dental procedure.  Per office protocol, patient can hold Eliquis for 1.5 days prior to procedure.    **This guidance is not considered finalized until pre-operative APP has relayed final recommendations.**

## 2022-11-14 ENCOUNTER — Telehealth: Payer: Self-pay | Admitting: *Deleted

## 2022-11-14 ENCOUNTER — Ambulatory Visit: Payer: No Typology Code available for payment source | Attending: Physician Assistant

## 2022-11-14 DIAGNOSIS — Z01810 Encounter for preprocedural cardiovascular examination: Secondary | ICD-10-CM

## 2022-11-14 NOTE — Telephone Encounter (Signed)
I called the [phone] which goes to a SNF, though I was told the pt does not reside there. I then called alternate # for the pt [phone] where I was able to reach the pt. Pt has been added on to today for tele pre op appt ok per pre op APP as procedure is 11/17/22. Pt states she is in a lot of pain with her teeth and she needs to have them taken out.   Med rec and consent are done.      Patient Consent for Virtual Visit        Kimberly [name] has provided verbal consent on 11/14/2022 for a virtual visit (video or telephone).   CONSENT FOR VIRTUAL VISIT FOR:  Kimberly [name]  By participating in this virtual visit I agree to the following:  I hereby voluntarily request, consent and authorize [facility] and its employed or contracted physicians, physician assistants, nurse practitioners or other licensed health care professionals (the Practitioner), to provide me with telemedicine health care services (the "Services") as deemed necessary by the treating Practitioner. I acknowledge and consent to receive the Services by the Practitioner via telemedicine. I understand that the telemedicine visit will involve communicating with the Practitioner through live audiovisual communication technology and the disclosure of certain medical information by electronic transmission. I acknowledge that I have been given the opportunity to request an in-person assessment or other available alternative prior to the telemedicine visit and am voluntarily participating in the telemedicine visit.  I understand that I have the right to withhold or withdraw my consent to the use of telemedicine in the course of my care at any time, without affecting my right to future care or treatment, and that the Practitioner or I may terminate the telemedicine visit at any time. I understand that I have the right to inspect all information obtained and/or recorded in the course of the telemedicine visit and may receive  copies of available information for a reasonable fee.  I understand that some of the potential risks of receiving the Services via telemedicine include:  Delay or interruption in medical evaluation due to technological equipment failure or disruption; Information transmitted may not be sufficient (e.g. poor resolution of images) to allow for appropriate medical decision making by the Practitioner; and/or  In rare instances, security protocols could fail, causing a breach of personal health information.  Furthermore, I acknowledge that it is my responsibility to provide information about my medical history, conditions and care that is complete and accurate to the best of my ability. I acknowledge that Practitioner's advice, recommendations, and/or decision may be based on factors not within their control, such as incomplete or inaccurate data provided by me or distortions of diagnostic images or specimens that may result from electronic transmissions. I understand that the practice of medicine is not an exact science and that Practitioner makes no warranties or guarantees regarding treatment outcomes. I acknowledge that a copy of this consent can be made available to me via my patient portal [facility] MyChart), or I can request a printed copy by calling the office of [facility].    I understand that my insurance will be billed for this visit.   I have read or had this consent read to me. I understand the contents of this consent, which adequately explains the benefits and risks of the Services being provided via telemedicine.  I have been provided ample opportunity to ask questions regarding this consent and the Services  and have had my questions answered to my satisfaction. I give my informed consent for the services to be provided through the use of telemedicine in my medical care

## 2022-11-14 NOTE — Telephone Encounter (Signed)
I called the [phone] which goes to a SNF, though I was told the pt does not reside there. I then called alternate # for the pt [phone] where I was able to reach the pt. Pt has been added on to today for tele pre op appt ok per pre op APP as procedure is 11/17/22. Pt states she is in a lot of pain with her teeth and she needs to have them taken out.    Med rec and consent are done.

## 2022-11-14 NOTE — Progress Notes (Signed)
Virtual Visit via Telephone Note   Because of Kimberly [name]'s co-morbid illnesses, she is at least at moderate risk for complications without adequate follow up.  This format is felt to be most appropriate for this patient at this time.  The patient did not have access to video technology/had technical difficulties with video requiring transitioning to audio format only (telephone).  All issues noted in this document were discussed and addressed.  No physical exam could be performed with this format.  Please refer to the patient's chart for her consent to telehealth for [facility].  Evaluation Performed:  Preoperative cardiovascular risk assessment _____________   Date:  11/14/2022   Patient ID:  Kimberly [name], DOB 12/15/1926, MRN [id number] Patient Location:  Home Provider location:   Office  Primary Care Provider:  Rebekah Chesterfield, NP Primary Cardiologist:  Thurmon Fair, MD  Chief Complaint / Patient Profile   89 y.o. y/o female with a h/o permanent atrial fibrillation with AV node ablation and secondary complete heart block (CRT-P device WITH GENERATOR CHANGE 2/22), diastolic heart failure (LVEF 55-60% by echo 11/21/19) who is pending dental extractions (9 teeth) and presents today for telephonic preoperative cardiovascular risk assessment.  History of Present Illness    Kimberly [name] is a 89 y.o. female who presents via audio/video conferencing for a telehealth visit today.  Pt was last seen in cardiology clinic on 09/01/22 by Dr. Royann Shivers.  At that time Kimberly [name] was doing well.  The patient is now pending procedure as outlined above. Since her last visit, she has had some SOB which has been stable over the last few months. Dr. Milinda Cave looks after her as well.  She has not had any new issues with her heart but has had this chronic lower abdomen pain that nobody can tell her what it is due to.  Normal bowel movements.  No swelling in her legs recently.  She tells me she  did stop her fluid pill because she is tired of using the bathroom.  We discussed only taking a morning fluid pill if she notices more swelling in her legs.  She can walk short distances but not long distances.  She is living by herself at the moment and takes care of her own home.  She has fallen before but this was a while ago and it was a mechanical fall.  Luckily, she has a medical alert button.  She tried living in assisted living but it was not for her.  There were no windows and the food was too salty.  She is quite happy living in her 1 floor home that is 2 bedrooms.  She did exceed 4 METS on the DASI.  Patient does NOT require pre-op antibiotics for dental procedure.   Per office protocol, patient can hold Eliquis for 1.5 days prior to procedure.  We discussed holding her Eliquis dose Saturday night all of Sunday and all of Monday (day of procedure).  Please resume when medically safe to do so.  Past Medical History    Past Medical History:  Diagnosis Date   Acute blood loss anemia 10/11/2012   Acute diverticulitis 08/24/2013   Acute on chronic combined systolic and diastolic CHF, NYHA class 4 (HCC) 11/15/2013   Antral ulcer 10/11/2012   Aortic atherosclerosis (HCC)    Atrial fibrillation (HCC)    Bipolar affective disorder (HCC) 03/23/2017   Cardiomyopathy, nonischemic (HCC)    Chronic anticoagulation 10/12/2012   CKD (chronic kidney  disease) stage 3, GFR 30-59 ml/min (HCC) 10/12/2012   Depression    Diverticulosis    DVT (deep venous thrombosis) (HCC)    left peroneal veins/IVC Filter placed as per hospital discharge 10/24/21   Erosive esophagitis 10/11/2012   Esophagitis    Fibromyalgia    Glaucoma    H/O echocardiogram 2007   EF 40-45%,          Hiatal hernia    History of anemia due to CKD    Hypertension    Internal hemorrhoids    Nephrolithiasis    Osteoarthritis    Pacemaker    Last saw cards 07/2013   Prepyloric ulcer    Scoliosis    Tubular adenoma of colon     Past Surgical History:  Procedure Laterality Date   ABDOMINAL HYSTERECTOMY     APPENDECTOMY     BACK SURGERY     BIOPSY  03/03/2017   Procedure: BIOPSY;  Surgeon: West Bali, MD;  Location: [facility];  Service: Endoscopy;;  gastric   BREAST SURGERY     CARDIAC CATHETERIZATION  12/08/2005   LAD AND LEFT MAIN WITH NO HIGH-GRADE STENOSIS. MILD DISEASE IN THE CX AND LAD SYSTEM. SEVERE LV DYSFUNCTION WITH DILATION OF THE LV. EF 15-20%. LV END-DIASTOLIC PRESSURE IS 90. +1 MR.   CHOLECYSTECTOMY     COLONOSCOPY N/A 03/03/2017   Dr. Darrick Penna: 5 colon polyps removed, adenomatous.  Diverticulosis.  99% of the colon was cleared but the cecum was not adequately seen.   CYSTOSCOPY N/A 02/24/2013   Procedure: CYSTOSCOPY WITH URETHRAL DILITATION;  Surgeon: Ky Barban, MD;  Location: [facility] ORS;  Service: Urology;  Laterality: N/A;   DOPPLER ECHOCARDIOGRAPHY N/A 05/30/2010   LV SIZE IS NORMAL. LV SYSTOLIC FUNCTION IS LOW NORMAL. EF=50-55%. MILD INFERIOR HYPOKINESIS.MILD TO MODERATE POSTERIOR WALL HYPOKINESIS.PACEMAKER LEAD IN THE RV. LA IS MILDLY DILATED. RA IS MODERATE TO SEVERLY DILATED. PACEMAKER LEAD IN THE RA. MILD CALCICICATION OF THE MV APPARATUS. MODERATE MR. MILD TO MODERATE TR. MILD PHTN.AV MILDLY SCLEROTIC.   ESOPHAGOGASTRODUODENOSCOPY N/A 10/13/2012   Dr. Jena Gauss: severe ulcerative reflux esophagitis, question of Barrett's but negative path, single deep prepyloric antral ulcer, negative H.pylori   ESOPHAGOGASTRODUODENOSCOPY N/A 03/03/2017   Dr. Darrick Penna: Gastritis/duodenitis, no H. pylori   ESOPHAGOGASTRODUODENOSCOPY (EGD) WITH PROPOFOL N/A 10/01/2021   Procedure: ESOPHAGOGASTRODUODENOSCOPY (EGD) WITH PROPOFOL;  Surgeon: Beverley Fiedler, MD;  Location: [facility] ENDOSCOPY;  Service: Gastroenterology;  Laterality: N/A;   HERNIA REPAIR     right inguinal hernia and umbilical   LOWETR EXT VENOUS Bilateral 11-08-10   R & L- NO EVIDENCE OF THROMBUS OR THROMBOPHLEBITIS. THERE IS MILD AMOUNT OF SUBCUTANEOUS  EDEMA NOTED WITHIN THE LEFT CALF AND ANKLE. R & L GSV AND SSV- NO VENOUS INSUFF NOTED.   NECK SURGERY     NUCLEAR STRESS TEST N/A 02/13/2009   NORMAL PATTERN OF PERFUSION IN ALL REGIONS. POST STRESS VENTICULAR SIZE IS NORMAL. POST  STESS EF 85%.  NORMAL MYOCARDIAL PERFUSION STUDY.   OPEN REDUCTION INTERNAL FIXATION (ORIF) DISTAL PHALANX Left 11/16/2018   Procedure: MIDDLE FINGER OPEN REDUCTION VERSUS RECONSTRUCTION;  Surgeon: Dominica Severin, MD;  Location: [facility] OR;  Service: Orthopedics;  Laterality: Left;   PACEMAKER INSERTION     POLYPECTOMY  03/03/2017   Procedure: POLYPECTOMY;  Surgeon: West Bali, MD;  Location: [facility];  Service: Endoscopy;;  colon   PPM GENERATOR CHANGEOUT N/A 06/18/2020   Procedure: PPM GENERATOR CHANGEOUT;  Surgeon: Thurmon Fair, MD;  Location:  [facility] INVASIVE CV LAB;  Service: Cardiovascular;  Laterality: N/A;   TONSILLECTOMY     YAG LASER APPLICATION Bilateral 11/20/2014   Procedure: YAG LASER APPLICATION;  Surgeon: Susa Simmonds, MD;  Location: [facility] ORS;  Service: Ophthalmology;  Laterality: Bilateral;    Allergies  Allergies  Allergen Reactions   Ciprofloxacin Other (See Comments)    Possibly caused diarrhea November 2018   Flagyl [Metronidazole] Other (See Comments)    Possibly caused diarrhea November 2018   Papaya Derivatives Hives   Iodine Rash and Other (See Comments)    REACTION:If injected,  Rash/irritated skin reaction "welts"   Penicillins Hives    DID THE REACTION INVOLVE: Swelling of the face/tongue/throat, SOB, or low BP? Unknown Sudden or severe rash/hives, skin peeling, or the inside of the mouth or nose? Yes Did it require medical treatment? Yes When did it last happen?    89 or 89 years old   If all above answers are "NO", may proceed with cephalosporin use.   Sulfa Antibiotics Rash    Home Medications    Prior to Admission medications   Medication Sig Start Date End Date Taking? Authorizing Provider  acetaminophen (TYLENOL)  325 MG tablet Take 325 mg by mouth every 4 (four) hours as needed for headache. Patient not taking: Reported on 09/01/2022    [provider]  apixaban (ELIQUIS) 2.5 MG TABS tablet Take 1 tablet (2.5 mg total) by mouth 2 (two) times daily. 09/01/22   Croitoru, Mihai, MD  apixaban (ELIQUIS) 2.5 MG TABS tablet Take 1 tablet (2.5 mg total) by mouth 2 (two) times daily. 09/01/22   Croitoru, Mihai, MD  carvedilol (COREG) 6.25 MG tablet Take 1 tablet (6.25 mg total) by mouth 2 (two) times daily with a meal. 11/08/21   Croitoru, Mihai, MD  clindamycin (CLEOCIN) 150 MG capsule Take 150 mg by mouth daily in the afternoon.    [provider]  dicyclomine (BENTYL) 10 MG capsule Take 1 capsule (10 mg total) by mouth 3 (three) times daily before meals. 05/24/21   Triplett, Tammy, PA-C  furosemide (LASIX) 20 MG tablet Take 1 tablet (20 mg total) by mouth daily as needed for edema (for worsening leg swelling or shortness of breath). Patient not taking: Reported on 11/14/2022 10/24/21   Arrien, York Ram, MD  mirtazapine (REMERON) 30 MG tablet Take 30 mg by mouth at bedtime.    [provider]  Multiple Vitamins-Minerals (PRESERVISION AREDS 2) CAPS Take 1 capsule by mouth 2 (two) times daily.    [provider]  omeprazole (PRILOSEC OTC) 20 MG tablet 20 mg daily as needed. 10/17/18   [provider]  Polyethyl Glycol-Propyl Glycol (SYSTANE) 0.4-0.3 % SOLN Apply 1 drop to eye daily as needed (dry eyes).    [provider]  Polyethylene Glycol 400 (BLINK TEARS) 0.25 % SOLN Apply 1 drop to eye daily.    [provider]  potassium chloride (KLOR-CON M) 10 MEQ tablet Take 1 tablet (10 mEq total) by mouth daily as needed (take with furosemide). 10/24/21   Arrien, York Ram, MD  tiZANidine (ZANAFLEX) 4 MG tablet Take 1 tablet (4 mg total) by mouth every 6 (six) hours as needed (for headaches). 10/03/21   Danford, Earl Lites, MD  zolpidem (AMBIEN) 5 MG tablet  Take 1 tablet (5 mg total) by mouth at bedtime. 10/03/21   Alberteen Sam, MD    Physical Exam    Vital Signs:  RIVKAH BAYE does not have vital  signs available for review today.  Given telephonic nature of communication, physical exam is limited. AAOx3. NAD. Normal affect.  Speech and respirations are unlabored.  Accessory Clinical Findings    None  Assessment & Plan    1.  Preoperative Cardiovascular Risk Assessment:   Ms. Sarasin perioperative risk of a major cardiac event is 0.9% according to the Revised Cardiac Risk Index (RCRI).  Therefore, she is at low risk for perioperative complications.   Her functional capacity is fair at 4.06 METs according to the Duke Activity Status Index (DASI). Recommendations: According to ACC/AHA guidelines, no further cardiovascular testing needed.  The patient may proceed to surgery at acceptable risk.   Antiplatelet and/or Anticoagulation Recommendations:  Eliquis (Apixaban) can be held for 1.5 days prior to surgery.  Please resume post op when felt to be safe.     The patient was advised that if she develops new symptoms prior to surgery to contact our office to arrange for a follow-up visit, and she verbalized understanding.  She does not need an antibiotic for SBE prophylaxis  A copy of this note will be routed to requesting surgeon.  Time:   Today, I have spent 35 minutes with the patient with telehealth technology discussing medical history, symptoms, and management plan.     Sharlene Dory, PA-C  11/14/2022, 3:22 PM

## 2022-11-14 NOTE — Telephone Encounter (Signed)
   Name: DESTIN MCABEE  DOB: April 18, 1927  MRN: [id number]  Primary Cardiologist: Thurmon Fair, MD  Chart reviewed as part of pre-operative protocol coverage. Because of Tinishia Balek Yankovich's past medical history and time since last visit, she will require a follow-up telephone visit in order to better assess preoperative cardiovascular risk.  Pre-op covering staff: - Please schedule appointment and call patient to inform them. If patient already had an upcoming appointment within acceptable timeframe, please add "pre-op clearance" to the appointment notes so provider is aware. - Please contact requesting surgeon's office via preferred method (i.e, phone, fax) to inform them of need for appointment prior to surgery.  Patient does NOT require pre-op antibiotics for dental procedure.   Per office protocol, patient can hold Eliquis for 1.5 days prior to procedure.    Sharlene Dory, PA-C  11/14/2022, 8:37 AM

## 2022-11-17 ENCOUNTER — Telehealth: Payer: Self-pay | Admitting: Cardiovascular Disease

## 2022-11-17 ENCOUNTER — Telehealth: Payer: Self-pay | Admitting: Cardiology

## 2022-11-17 NOTE — Telephone Encounter (Signed)
Pt c/o medication issue:  1. Name of Medication:   apixaban (ELIQUIS) 2.5 MG TABS tablet   2. How are you currently taking this medication (dosage and times per day)?   3. Are you having a reaction (difficulty breathing--STAT)?   4. What is your medication issue?   Patient stated she had dental work done today and wants to know when she can start back on her Eliquis.

## 2022-11-17 NOTE — Telephone Encounter (Signed)
Spoke to the patient, pt had 9 teeth extracted today and called to seek clarification when to resume Eliquis. Pt stated her gums are still bleeding,she has her dentures in and applying paper towels to stop the bleeding.  Advised pt to contact her dentist, pertaining to her symptoms and when to resume Eliquis. Pt voiced understanding.

## 2022-11-17 NOTE — Telephone Encounter (Signed)
Please let her know that placing a teabag between the gums can hlp stop the bleeding and I would stay off the Eliquis for at least another 24 hours

## 2022-11-17 NOTE — Telephone Encounter (Signed)
Spoke to the patient, explained Dr. Royann Shivers recommendation:  Please let her know that placing a teabag between the gums can hlp stop  the bleeding and I would stay off the Eliquis for at least another 24 hours    Pt voiced understanding.

## 2022-11-17 NOTE — Telephone Encounter (Signed)
Pt called to see if she could use any other tea bag to stop bleeding.  Reassured.  Understood no Eliquis tonight or tomorrow.

## 2022-11-20 ENCOUNTER — Telehealth: Payer: Self-pay | Admitting: Cardiovascular Disease

## 2022-11-20 DIAGNOSIS — D649 Anemia, unspecified: Secondary | ICD-10-CM

## 2022-11-20 DIAGNOSIS — R195 Other fecal abnormalities: Secondary | ICD-10-CM

## 2022-11-20 NOTE — Telephone Encounter (Signed)
Spoke to the patient, c/o abdominal cramping with constipation/diarrhea sometimes her stools  are black.Pt stated sometimes her stools  are orange-brown color. Pt stated she did contact her PCP however, due to her age there is nothing they can do about it. Pt stated she had a  bowel movement today and the color of her BM was orange-brown color. Pt stated she did resume Eliquis on 7/16. Explained ED precautions, pt stated " I will not be doing that, it cost $250 one way". Advised someone can take her to urgent care, pt replied " why that won't help". Will forward to MD and nurse for advise.

## 2022-11-20 NOTE — Telephone Encounter (Signed)
Patient is returning call. Requesting return call.  

## 2022-11-20 NOTE — Telephone Encounter (Signed)
Does not sound like active bleeding.  Would expect the stools to be pitch black.  Bleeding would also cause persistent diarrhea, not constipation.  She has had issues with alternating diarrhea and constipation for very long time.  Ask her if she is taking the Bentyl (dicyclomine).  This can worsen constipation and she should stop it if constipation is the dominant problem.  Also ask her if it is accurate that she is taking clindamycin every day.

## 2022-11-20 NOTE — Telephone Encounter (Signed)
Pt reassured that she can take Tylenol for her mouth pain as recommended by her dentist.

## 2022-11-20 NOTE — Telephone Encounter (Signed)
Left voicemail message to call the clinic. 

## 2022-11-20 NOTE — Telephone Encounter (Signed)
Left voicemail message for the pt to call the clinic. 

## 2022-11-20 NOTE — Telephone Encounter (Signed)
Patient is calling because she is having abdominal issues with cramping and constipation. States when she does go she has blood in her stool. Requesting call back to discuss this.

## 2022-11-21 NOTE — Telephone Encounter (Signed)
Left message and a call back number

## 2022-11-24 NOTE — Telephone Encounter (Signed)
Spoke to patient . She states the office number comes up as 'SPAM" She has a landline phone.  Patient states she takes Bentyl if she needs it.  She is not taking clindamycin   Patient  states she is only taking: Carvedilol  6.25 mg twice a day  Eliquis  2.5 mg twice a day  Sertraline Cr  50 mg   Tylenol as needed.  Patient states she wants to know if she needs to continue taking Eliquis.  RN informed patient to continue taking all medication RN read information from Dr  [employer]

## 2022-11-24 NOTE — Telephone Encounter (Signed)
Left voicemail to return call to office.

## 2022-11-24 NOTE — Telephone Encounter (Signed)
Patient is returning call.  °

## 2022-11-25 ENCOUNTER — Telehealth: Payer: Self-pay | Admitting: Cardiovascular Disease

## 2022-11-25 NOTE — Telephone Encounter (Signed)
Follow Up;     Patient says she is returning a call from today.

## 2022-11-25 NOTE — Telephone Encounter (Signed)
Please continue Eliquis unless stool is bright red bloody or pitch black and runny. Please check a CBC.

## 2022-11-25 NOTE — Telephone Encounter (Signed)
Patient is calling to give information for labs and is requesting call back to discuss further.

## 2022-11-25 NOTE — Telephone Encounter (Signed)
Patient case worker states patient had CBC done on the 17th.  Would like to know if this would be OK rather than her have a new one drawn.  Advised I would speak with provider as it depends on what values he is looking at and how recent he needs it.  Please advise if the other lab is good or needs the one ordered today.

## 2022-11-25 NOTE — Telephone Encounter (Signed)
Information given to michelle.

## 2022-11-25 NOTE — Telephone Encounter (Signed)
Patient has spoken with Dr. Herbie Baltimore nurse.

## 2022-11-25 NOTE — Telephone Encounter (Signed)
Spoke to patient . She voiced understanding. She states she will be going to her primary office to have lab done. She states she will have someone drive her there.  Faxed labslip to K. Milinda Cave NP 's office

## 2022-11-25 NOTE — Addendum Note (Signed)
Addended by: Tobin Chad on: 11/25/2022 02:19 PM   Modules accepted: Orders

## 2022-11-25 NOTE — Telephone Encounter (Signed)
I found the labs from 07/17 in the [facility] DXA tab (they are not in our results or in care Everywhere). The hemoglobin is stable and normal. It does not need top be repeated. Thanks

## 2022-11-25 NOTE — Telephone Encounter (Signed)
Caller stated patient had lab tests done on 7/17 and wants to know if she can forward that information for Dr. Erin Hearing review instead of re-drawing new labs.

## 2022-12-04 ENCOUNTER — Telehealth: Payer: Self-pay | Admitting: Cardiovascular Disease

## 2022-12-04 NOTE — Telephone Encounter (Signed)
Pt states she is having bloody stools and diarrhea and not sure if she should f/u with Cardiology or not. Please advise.

## 2022-12-04 NOTE — Telephone Encounter (Signed)
Returned call to pt. No answer, unable to LVM as there is no VM set up.

## 2022-12-05 NOTE — Telephone Encounter (Signed)
Does not sound like bleeding.  No new recommendations.

## 2022-12-05 NOTE — Telephone Encounter (Signed)
Patient aware and will follow with PCP

## 2022-12-05 NOTE — Telephone Encounter (Signed)
Patient states bloody stools and diarrhea for about 1-2 weeks.  States "changing colors all the time".  States color is like an orange color, not red.Sometimes it is regular brown.  HX of hemorrhoids but no current issues.  She takes preservision but no other supplements.  Advised to speak with PCP as no active bleeding, more GI issues.   She states has blood in stool (though earlier conversation states none based on color etc) before and needed blood transfusion.  She discusses other issues of incontinence and headaches etc.  Advised to ensure hydration .  She will reach out to PCP for appt with PCP or  previous GI as they order.   Please advise if any changes on our end.

## 2022-12-08 ENCOUNTER — Telehealth: Payer: Self-pay | Admitting: Cardiovascular Disease

## 2022-12-08 ENCOUNTER — Telehealth: Payer: Self-pay | Admitting: Internal Medicine

## 2022-12-08 MED ORDER — OMEPRAZOLE MAGNESIUM 20 MG PO TBEC: 20.0000 mg | DELAYED_RELEASE_TABLET | Freq: Every day | ORAL | 3 refills | Status: DC

## 2022-12-08 NOTE — Telephone Encounter (Signed)
Called to tell me she has bruising, fatigue, lower back pain and some dark stools. I recommended she go to the emergency department to be evaluated.

## 2022-12-08 NOTE — Telephone Encounter (Signed)
Patient took omeprazole in the past.  OK to resume this?

## 2022-12-08 NOTE — Telephone Encounter (Signed)
Advised patient to continue Omeprazole as she had in the past.  Sent to [employer].  She states understanding

## 2022-12-08 NOTE — Telephone Encounter (Signed)
Patient wants to know what medication she can take for acid reflux.

## 2022-12-08 NOTE — Telephone Encounter (Signed)
Patient is calling to ask about a medication. Please advise.

## 2022-12-08 NOTE — Telephone Encounter (Signed)
Yes, OK to resume omeprazole.

## 2022-12-08 NOTE — Telephone Encounter (Signed)
Patient aware prilosec was sent to pharmacy

## 2022-12-09 ENCOUNTER — Emergency Department (HOSPITAL_COMMUNITY)
Admission: EM | Admit: 2022-12-09 | Discharge: 2022-12-10 | Disposition: A | Discharge status: Home / Self Care | Payer: Medicare Other | Attending: Emergency Medicine | Admitting: Emergency Medicine

## 2022-12-09 ENCOUNTER — Telehealth: Payer: Self-pay | Admitting: Cardiovascular Disease

## 2022-12-09 ENCOUNTER — Telehealth: Payer: Self-pay | Admitting: Cardiology

## 2022-12-09 ENCOUNTER — Other Ambulatory Visit: Payer: Self-pay

## 2022-12-09 ENCOUNTER — Encounter (HOSPITAL_COMMUNITY): Payer: Self-pay

## 2022-12-09 DIAGNOSIS — Z7901 Long term (current) use of anticoagulants: Secondary | ICD-10-CM | POA: Diagnosis not present

## 2022-12-09 DIAGNOSIS — I509 Heart failure, unspecified: Secondary | ICD-10-CM | POA: Insufficient documentation

## 2022-12-09 DIAGNOSIS — N189 Chronic kidney disease, unspecified: Secondary | ICD-10-CM | POA: Diagnosis not present

## 2022-12-09 DIAGNOSIS — I130 Hypertensive heart and chronic kidney disease with heart failure and stage 1 through stage 4 chronic kidney disease, or unspecified chronic kidney disease: Secondary | ICD-10-CM | POA: Diagnosis not present

## 2022-12-09 DIAGNOSIS — Z79899 Other long term (current) drug therapy: Secondary | ICD-10-CM | POA: Insufficient documentation

## 2022-12-09 DIAGNOSIS — G8929 Other chronic pain: Secondary | ICD-10-CM | POA: Diagnosis not present

## 2022-12-09 DIAGNOSIS — Z950 Presence of cardiac pacemaker: Secondary | ICD-10-CM | POA: Diagnosis not present

## 2022-12-09 DIAGNOSIS — M7981 Nontraumatic hematoma of soft tissue: Secondary | ICD-10-CM | POA: Insufficient documentation

## 2022-12-09 DIAGNOSIS — R1031 Right lower quadrant pain: Secondary | ICD-10-CM | POA: Insufficient documentation

## 2022-12-09 NOTE — Telephone Encounter (Signed)
Called pt back. Bruising is going from ankle to the calf and it is one solid bruise. She has not fallen or anything. Pt has what she thinks is internal bleeding on her right sided abdomen as well. She states she can tell by her bowel movements. She wants the doctor to advise what to do.

## 2022-12-09 NOTE — ED Triage Notes (Signed)
Pt c/o LR abdominal pain x few days. Pt states she is having blood in her stools for a long time. Also endorses headache.

## 2022-12-09 NOTE — Telephone Encounter (Signed)
Pt states that she is still awaiting a callback as to what she should do about the bruising on her ankles and feet. Please advise

## 2022-12-09 NOTE — Telephone Encounter (Signed)
    Patient called after hours answering service tonight requesting a call back. Patient has a history of acute GI bleeding and reports that over the last several months she has had bloody stools reminiscent of previous bleeding. She describes a mix of orange stools and dark stools. Apparently thought this might be hemorrhoids and called [facility] office on 12/04/22. At the time, there was low concern for new bleeding based on the way she described symptoms at that time. Tonight patient reports that bleeding seems to have worsened in the past day or so and she is now having acute abdominal cramping "like I'm having a period again." She also notes worsening dyspnea and dizziness. Given these symptoms with concern for hematochezia/melena and previous GI bleed in patient on Eliquis, I advised her to go the ED tonight via EMS. Concern for acute anemia 2/2 bleeding. Patient reports that an ambulance is too expensive but she agreed that she would either call EMS or a cab to take her to nearest medical facility tonight.   Perlie Gold, PA-C

## 2022-12-09 NOTE — Telephone Encounter (Signed)
Pt states she is bruising in her legs more than usual. Please advise.   Pt also stated she has internal bleeding on her right side.

## 2022-12-10 ENCOUNTER — Emergency Department (HOSPITAL_COMMUNITY): Payer: Medicare Other

## 2022-12-10 LAB — POC OCCULT BLOOD, ED: Fecal Occult Bld: NEGATIVE

## 2022-12-10 NOTE — ED Notes (Signed)
Open chart to get address to arrange transport

## 2022-12-10 NOTE — Telephone Encounter (Signed)
Patient has been seen in the ER 

## 2022-12-10 NOTE — ED Provider Notes (Signed)
[facility]PART[facility] AT [facility] Provider Note   CSN: [id number] Arrival date & time: 12/09/22  2242     History  No chief complaint on file.   Kimberly [name] is a 89 y.o. female.  HPI Patient presents for abdominal pain.  Medical history includes CHF, atrial fibrillation, HTN, CKD, fibromyalgia, diverticulosis, heart block s/p pacemaker, prior lower GI bleeding, bipolar disorder.  She is prescribed Eliquis.  Patient reports chronic right lower quadrant abdominal pain over the past 2 years.  She also reports chronic intermittent GI bleeding.  She describes intermittent dark stools.  She states that her symptoms have not worsened recently.  She states that she had a conversation with her cardiologist and the cardiologist told her to come to the ED for evaluation of her abdominal pain.  She lives independently.  She ambulates with a walker.    Home Medications Prior to Admission medications   Medication Sig Start Date End Date Taking? Authorizing Provider  acetaminophen (TYLENOL) 325 MG tablet Take 325 mg by mouth every 4 (four) hours as needed for headache. Patient not taking: Reported on 09/01/2022    [provider]  apixaban (ELIQUIS) 2.5 MG TABS tablet Take 1 tablet (2.5 mg total) by mouth 2 (two) times daily. 09/01/22   Croitoru, Mihai, MD  apixaban (ELIQUIS) 2.5 MG TABS tablet Take 1 tablet (2.5 mg total) by mouth 2 (two) times daily. 09/01/22   Croitoru, Mihai, MD  carvedilol (COREG) 6.25 MG tablet Take 1 tablet (6.25 mg total) by mouth 2 (two) times daily with a meal. 11/08/21   Croitoru, Mihai, MD  clindamycin (CLEOCIN) 150 MG capsule Take 150 mg by mouth daily in the afternoon.    [provider]  dicyclomine (BENTYL) 10 MG capsule Take 1 capsule (10 mg total) by mouth 3 (three) times daily before meals. 05/24/21   Triplett, Tammy, PA-C  furosemide (LASIX) 20 MG tablet Take 1 tablet (20 mg total) by mouth daily as needed for edema (for worsening  leg swelling or shortness of breath). Patient not taking: Reported on 11/14/2022 10/24/21   Arrien, York Ram, MD  mirtazapine (REMERON) 30 MG tablet Take 30 mg by mouth at bedtime.    [provider]  Multiple Vitamins-Minerals (PRESERVISION AREDS 2) CAPS Take 1 capsule by mouth 2 (two) times daily.    [provider]  omeprazole (PRILOSEC OTC) 20 MG tablet Take 1 tablet (20 mg total) by mouth daily. 12/08/22   Croitoru, Mihai, MD  Polyethyl Glycol-Propyl Glycol (SYSTANE) 0.4-0.3 % SOLN Apply 1 drop to eye daily as needed (dry eyes).    [provider]  Polyethylene Glycol 400 (BLINK TEARS) 0.25 % SOLN Apply 1 drop to eye daily.    [provider]  potassium chloride (KLOR-CON M) 10 MEQ tablet Take 1 tablet (10 mEq total) by mouth daily as needed (take with furosemide). 10/24/21   Arrien, York Ram, MD  tiZANidine (ZANAFLEX) 4 MG tablet Take 1 tablet (4 mg total) by mouth every 6 (six) hours as needed (for headaches). 10/03/21   Danford, Earl Lites, MD  zolpidem (AMBIEN) 5 MG tablet Take 1 tablet (5 mg total) by mouth at bedtime. 10/03/21   Danford, Earl Lites, MD      Allergies    Ciprofloxacin, Flagyl [metronidazole], Papaya derivatives, Iodine, Penicillins, and Sulfa antibiotics    Review of Systems   Review of Systems  Gastrointestinal:  Positive for abdominal pain and blood in stool.  All other systems  reviewed and are negative.   Physical Exam Updated Vital Signs BP 132/87   Pulse 62   Temp 98.6 F (37 C) (Axillary)   Resp 18   SpO2 99%  Physical Exam Vitals and nursing note reviewed.  Constitutional:      General: She is not in acute distress.    Appearance: Normal appearance. She is well-developed. She is not ill-appearing, toxic-appearing or diaphoretic.  HENT:     Head: Normocephalic and atraumatic.     Right Ear: External ear normal.     Left Ear: External ear normal.     Nose: Nose normal.     Mouth/Throat:     Mouth:  Mucous membranes are moist.  Eyes:     Extraocular Movements: Extraocular movements intact.     Conjunctiva/sclera: Conjunctivae normal.  Cardiovascular:     Rate and Rhythm: Normal rate and regular rhythm.  Pulmonary:     Effort: Pulmonary effort is normal. No respiratory distress.  Abdominal:     Palpations: Abdomen is soft.     Tenderness: There is abdominal tenderness. There is no guarding or rebound.  Genitourinary:    Rectum: Guaiac result negative.  Musculoskeletal:        General: No swelling. Normal range of motion.     Cervical back: Normal range of motion and neck supple.  Skin:    General: Skin is warm and dry.     Coloration: Skin is not jaundiced or pale.     Findings: Bruising present.  Neurological:     General: No focal deficit present.     Mental Status: She is alert and oriented to person, place, and time.  Psychiatric:        Mood and Affect: Mood normal.        Behavior: Behavior normal.     ED Results / Procedures / Treatments   Labs (all labs ordered are listed, but only abnormal results are displayed) Labs Reviewed  LIPASE, BLOOD - Abnormal; Notable for the following components:      Result Value   Lipase 56 (*)    All other components within normal limits  COMPREHENSIVE METABOLIC PANEL - Abnormal; Notable for the following components:   Sodium 132 (*)    Creatinine, Ser 1.01 (*)    GFR, Estimated 52 (*)    All other components within normal limits  CBC - Abnormal; Notable for the following components:   RBC 3.46 (*)    Hemoglobin 11.4 (*)    HCT 35.2 (*)    MCV 101.7 (*)    RDW 15.7 (*)    All other components within normal limits  URINALYSIS, ROUTINE W REFLEX MICROSCOPIC  POC OCCULT BLOOD, ED    EKG None  Radiology CT ABDOMEN PELVIS WO CONTRAST  Result Date: 12/10/2022 CLINICAL DATA:  Abdominal pain EXAM: CT ABDOMEN AND PELVIS WITHOUT CONTRAST TECHNIQUE: Multidetector CT imaging of the abdomen and pelvis was performed following the  standard protocol without IV contrast. RADIATION DOSE REDUCTION: This exam was performed according to the departmental dose-optimization program which includes automated exposure control, adjustment of the mA and/or kV according to patient size and/or use of iterative reconstruction technique. COMPARISON:  10/20/2021 FINDINGS: Lower chest: Small hiatal hernia. Pacer wires in the right heart. Diffuse coronary artery and aortic atherosclerosis. No acute findings. Hepatobiliary: Prior cholecystectomy. Multiple low-density lesions in the liver measuring up to 2.9 cm in the inferior right hepatic lobe most compatible with cysts. Pancreas: No focal abnormality or ductal dilatation.  Spleen: No focal abnormality.  Normal size. Adrenals/Urinary Tract: Adrenal glands normal. 2 mm nonobstructing midpole stone in the right kidney. No ureteral stones or hydronephrosis. Urinary bladder unremarkable. Stomach/Bowel: Diffuse colonic diverticulosis. There is stranding around the splenic flexure which could reflect early acute diverticulitis. Stomach and small bowel decompressed, unremarkable. Vascular/Lymphatic: Diffuse aortoiliac and branch vessel atherosclerosis. No evidence of aneurysm or adenopathy. Reproductive: Prior hysterectomy.  No adnexal masses. Other: No free fluid or free air. Musculoskeletal: No acute bony abnormality. Severe rightward scoliosis and degenerative changes in the lumbar spine. IMPRESSION: Diffuse colonic diverticulosis. Slight stranding noted around the colon at the splenic flexure which could reflect early acute diverticulitis. Diffuse aortoiliac atherosclerosis and coronary artery disease. Right nephrolithiasis.  No hydronephrosis. Small hiatal hernia. Electronically Signed   By: Charlett Nose M.D.   On: 12/10/2022 01:06    Procedures Procedures    Medications Ordered in ED Medications - No data to display  ED Course/ Medical Decision Making/ A&P                                 Medical  Decision Making Amount and/or Complexity of Data Reviewed Labs: ordered. Radiology: ordered.   Patient presents for concern of right lower quadrant abdominal pain and dark stools, although she states that this has been ongoing for the past 2 years.  Vital signs on arrival are reassuring.  Patient is well-appearing on exam.  Tenderness to right lower quadrant is present.  Patient declines any medication for analgesia.  DRE was performed with nurse chaperone present.  Brown stool was present, which was Hemoccult negative.  Patient underwent lab work prior to being bedded in the ED.  Results show baseline anemia, normal kidney function, normal electrolytes, normal lipase and normal hepatobiliary enzymes.  She underwent CT scanning of abdomen and pelvis.  Results did not show any acute findings to explain her symptoms.  There was some slight stranding around splenic flexure which could suggest early diverticulitis, however, patient does not have any symptoms in this area.  Patient was informed of workup results.  She was discharged in stable condition.        Final Clinical Impression(s) / ED Diagnoses Final diagnoses:  Chronic abdominal pain    Rx / DC Orders ED Discharge Orders     None         Gloris Manchester, MD 12/10/22 [phone]

## 2022-12-10 NOTE — Discharge Instructions (Signed)
Test results today in the emergency department are reassuring.  Follow-up with your primary care doctor for ongoing management of your chronic conditions.  Return to the emergency department for any new or worsening symptoms of concern.

## 2022-12-10 NOTE — ED Notes (Signed)
Patient transported to CT 

## 2022-12-11 ENCOUNTER — Emergency Department (HOSPITAL_COMMUNITY)
Admission: EM | Admit: 2022-12-11 | Discharge: 2022-12-12 | Disposition: A | Discharge status: Home / Self Care | Payer: Medicare Other | Source: Home / Self Care | Attending: Emergency Medicine | Admitting: Emergency Medicine

## 2022-12-11 ENCOUNTER — Other Ambulatory Visit: Payer: Self-pay

## 2022-12-11 ENCOUNTER — Encounter (HOSPITAL_COMMUNITY): Payer: Self-pay | Admitting: Emergency Medicine

## 2022-12-11 DIAGNOSIS — R197 Diarrhea, unspecified: Secondary | ICD-10-CM | POA: Insufficient documentation

## 2022-12-11 DIAGNOSIS — G8929 Other chronic pain: Secondary | ICD-10-CM

## 2022-12-11 DIAGNOSIS — R109 Unspecified abdominal pain: Secondary | ICD-10-CM | POA: Insufficient documentation

## 2022-12-11 DIAGNOSIS — Z7901 Long term (current) use of anticoagulants: Secondary | ICD-10-CM | POA: Insufficient documentation

## 2022-12-11 DIAGNOSIS — I4891 Unspecified atrial fibrillation: Secondary | ICD-10-CM | POA: Insufficient documentation

## 2022-12-11 DIAGNOSIS — I509 Heart failure, unspecified: Secondary | ICD-10-CM | POA: Diagnosis not present

## 2022-12-11 DIAGNOSIS — K5900 Constipation, unspecified: Secondary | ICD-10-CM | POA: Insufficient documentation

## 2022-12-11 NOTE — Telephone Encounter (Signed)
Patient calling back to say that she went to the ER, but she still brusie and swollen. Please advise

## 2022-12-11 NOTE — ED Triage Notes (Signed)
Pt brought in by EMS with c/o constipation and diarrhea for "years". Pt also states she is "bleeding internally". Seen earlier this week for same.

## 2022-12-12 NOTE — ED Notes (Signed)
Patient states "I am ready to go home. I don't even know why I'm here". MD made aware

## 2022-12-12 NOTE — ED Provider Notes (Signed)
[facility] EMERGENCY DEPARTMENT AT [facility]  Provider Note  CSN: [id number] Arrival date & time: 12/11/22 2323  History Chief Complaint  Patient presents with   Constipation   Diarrhea    Kimberly [name] is a 89 y.o. female with history of CHF, Afib, chronic abdominal pain still lives independently, was brought to the ED by EMS for evaluation of constipation and diarrhea. She cannot tell me who called EMS. She admits these symptoms have been ongoing for years. She also reports some bruising to her legs without known injury. She was in the ED for similar 2 days ago and had a negative workup including labs and CT. At the time of my evaluation tonight she declines any further workup and requests to be discharged.    Home Medications Prior to Admission medications   Medication Sig Start Date End Date Taking? Authorizing Provider  acetaminophen (TYLENOL) 325 MG tablet Take 325 mg by mouth every 4 (four) hours as needed for headache. Patient not taking: Reported on 09/01/2022    [provider]  apixaban (ELIQUIS) 2.5 MG TABS tablet Take 1 tablet (2.5 mg total) by mouth 2 (two) times daily. 09/01/22   Croitoru, Mihai, MD  apixaban (ELIQUIS) 2.5 MG TABS tablet Take 1 tablet (2.5 mg total) by mouth 2 (two) times daily. 09/01/22   Croitoru, Mihai, MD  carvedilol (COREG) 6.25 MG tablet Take 1 tablet (6.25 mg total) by mouth 2 (two) times daily with a meal. 11/08/21   Croitoru, Mihai, MD  clindamycin (CLEOCIN) 150 MG capsule Take 150 mg by mouth daily in the afternoon.    [provider]  dicyclomine (BENTYL) 10 MG capsule Take 1 capsule (10 mg total) by mouth 3 (three) times daily before meals. 05/24/21   Triplett, Tammy, PA-C  furosemide (LASIX) 20 MG tablet Take 1 tablet (20 mg total) by mouth daily as needed for edema (for worsening leg swelling or shortness of breath). Patient not taking: Reported on 11/14/2022 10/24/21   Arrien, York Ram, MD  mirtazapine (REMERON)  30 MG tablet Take 30 mg by mouth at bedtime.    [provider]  Multiple Vitamins-Minerals (PRESERVISION AREDS 2) CAPS Take 1 capsule by mouth 2 (two) times daily.    [provider]  omeprazole (PRILOSEC OTC) 20 MG tablet Take 1 tablet (20 mg total) by mouth daily. 12/08/22   Croitoru, Mihai, MD  Polyethyl Glycol-Propyl Glycol (SYSTANE) 0.4-0.3 % SOLN Apply 1 drop to eye daily as needed (dry eyes).    [provider]  Polyethylene Glycol 400 (BLINK TEARS) 0.25 % SOLN Apply 1 drop to eye daily.    [provider]  potassium chloride (KLOR-CON M) 10 MEQ tablet Take 1 tablet (10 mEq total) by mouth daily as needed (take with furosemide). 10/24/21   Arrien, York Ram, MD  tiZANidine (ZANAFLEX) 4 MG tablet Take 1 tablet (4 mg total) by mouth every 6 (six) hours as needed (for headaches). 10/03/21   Danford, Earl Lites, MD  zolpidem (AMBIEN) 5 MG tablet Take 1 tablet (5 mg total) by mouth at bedtime. 10/03/21   Danford, Earl Lites, MD     Allergies    Ciprofloxacin, Flagyl [metronidazole], Papaya derivatives, Iodine, Penicillins, and Sulfa antibiotics   Review of Systems   Review of Systems Please see HPI for pertinent positives and negatives  Physical Exam BP 135/63 (BP Location: Right Arm)   Pulse 81   Temp 97.7 F (36.5 C) (Oral)   Resp 18  Ht 5\' 7"  (1.702 m)   Wt 56 kg   SpO2 100%   BMI 19.34 kg/m   Physical Exam Vitals and nursing note reviewed.  HENT:     Head: Normocephalic.     Nose: Nose normal.  Eyes:     Extraocular Movements: Extraocular movements intact.  Pulmonary:     Effort: Pulmonary effort is normal.  Musculoskeletal:        General: Normal range of motion.     Cervical back: Neck supple.     Comments: Old bruising vs venous stasis skin changes to BLE  Skin:    Findings: No rash (on exposed skin).  Neurological:     Mental Status: She is alert and oriented to person, place, and time.  Psychiatric:        Mood and  Affect: Mood normal.     ED Results / Procedures / Treatments   EKG None  Procedures Procedures  Medications Ordered in the ED Medications - No data to display  Initial Impression and Plan  Patient brought by EMS for unknown reasons. She declines any further ED evaluation and requests to be discharged. She did have a thorough evaluation of her chronic complaints 2 days ago that was negative. Doubt there is an acute emergent condition present tonight as well. Exam and vitals are reassuring. Recommend outpatient follouwup, RTED for any other concerns.   ED Course       MDM Rules/Calculators/A&P Medical Decision Making Problems Addressed: Chronic abdominal pain: chronic illness or injury     Final Clinical Impression(s) / ED Diagnoses Final diagnoses:  Chronic abdominal pain    Rx / DC Orders ED Discharge Orders     None        Pollyann Savoy, MD 12/12/22 [phone]

## 2022-12-13 ENCOUNTER — Telehealth: Payer: Self-pay | Admitting: Student

## 2022-12-13 NOTE — Telephone Encounter (Signed)
   Patient called Answering Service with concerns about bruising. Called and spoke with patient. She reports she has "internal bleeding" and has bruises all over the place. She states her stools look black but no hematochezia or hematuria. She was recently seen in the ED earlier this week. Hemoccult was negative and hemoglobin stable at 11.4 which is where it was in 05/2022. Per chart review, there are multiple calls with similar complaints. Discussed that recent lab work is reassuring that she is not having a serious bleeding and it is not uncommon to bruise much more easily the older we get. She stated "I know." She has atrial fibrillation and is on low Eliquis. I tried to explain that this is to help prevent her risk for a stroke and she said "I know, I have been on that for a long time." Given recent labs, recommended remaining on Eliquis for now. Discussed that we may need to arrange an office visit with her primary Cardiologist to discuss long-term plan for Eliquis and risk of bleeding vs stroke. Will route this not to Dr. Royann Shivers as a heads up given multiple phone calls around the same topic.  Kimberly Parker, PA-C 12/13/2022 10:15 AM

## 2022-12-17 ENCOUNTER — Telehealth: Payer: Self-pay | Admitting: Cardiovascular Disease

## 2022-12-17 NOTE — Telephone Encounter (Signed)
Pt c/o medication issue:  1. Name of Medication: apixaban (ELIQUIS) 2.5 MG TABS tablet   2. How are you currently taking this medication (dosage and times per day)? As directed  3. Are you having a reaction (difficulty breathing--STAT)? no  4. What is your medication issue? Patient called in to after hours service. She wants to speak with Dr. Vickii Chafe about a magazine that she read regarding aspirin and blood thinners.

## 2022-12-17 NOTE — Telephone Encounter (Signed)
 Returned call to pt. LVM for pt to call back.

## 2022-12-18 ENCOUNTER — Other Ambulatory Visit: Payer: Self-pay | Admitting: Cardiovascular Disease

## 2022-12-18 NOTE — Telephone Encounter (Signed)
Call and LM.  Patient needs an appt to discuss with provider as this has been addressed with several calls.

## 2022-12-19 ENCOUNTER — Telehealth: Payer: Self-pay | Admitting: Student

## 2022-12-19 NOTE — Telephone Encounter (Signed)
LM that she needs to schedule an appt and will have scheduling reach out to her

## 2022-12-19 NOTE — Telephone Encounter (Signed)
   Patient called Answering Service for hours.  I called and spoke with patient.  Patient's main reason for calling was for diarrhea.  She states she has been having diarrhea for the past several days and reports her stools look orange.  There are multiple telephone notes about patient calling with concerns about her having blood in her stools.  I actually spoke with the patient this past weekend on 12/13/2018  about this.  She was recently seen in the ED on 12/11/2022 and hemoccult was negative and hemoglobin was stable.  Tried to reassure patient of this and recommended she reach out to her PCP in regards to her diarrhea.  She also reported some shortness of breath but state this is a chronic issue and it sounds like this is often due to anxiety. She states she tends to hyperventilate when she gets anxious, frustrated, or upset. She is frustrated that no one seems to no what is a matter with her. She has frequent ED visits. No current chest pain. She is not having any acute cardiac symptoms so I do not think she needs to go to the ED. Again, she states her biggest concern this evening is the diarrhea and she is going to reach out to her PCP as recommended.  Of note, patient is 89 years old and lives alone. She refuses to live in a nursing home because she states she does not like being told what to do and when to do. She has frequent ED visits and frequently calls our office. I think anxiety is a large contributing factor to all her complaints. While on the phone, she said "You know what you can do for me? You can come and visit me. You don't know how lonely it is being by yourself." I think this is contributing as well.  Discussed ED precautions. Patient thanked me for calling.  Corrin Parker, PA-C 12/19/2022 6:58 PM

## 2022-12-26 ENCOUNTER — Other Ambulatory Visit: Payer: Self-pay | Admitting: Cardiovascular Disease

## 2022-12-29 NOTE — Telephone Encounter (Signed)
Prescription refill request for Eliquis received. Indication:afib Last office visit:4/24 Scr:1.25  8/24 Age: 89 Weight:56  kg  Prescription refilled

## 2022-12-30 ENCOUNTER — Telehealth: Payer: Self-pay | Admitting: Emergency Medicine

## 2022-12-30 MED ORDER — OMEPRAZOLE 20 MG PO CPDR: 20.0000 mg | DELAYED_RELEASE_CAPSULE | Freq: Every day | ORAL | 3 refills | Status: AC

## 2022-12-30 NOTE — Telephone Encounter (Signed)
Called in regards to medical clarification needed on changing omeprazole 20 mg to capsules instead of tablets. Tablets are not covered by the pt's insurance.   Will send in new prescription for capsules instead of tablets.

## 2023-01-02 ENCOUNTER — Telehealth: Payer: Self-pay | Admitting: Cardiovascular Disease

## 2023-01-02 NOTE — Telephone Encounter (Signed)
Patient is returning RN's call. Please advise. 

## 2023-01-02 NOTE — Telephone Encounter (Signed)
Pt c/o of Chest Pain: STAT if CP now or developed within 24 hours  1. Are you having CP right now? No  2. Are you experiencing any other symptoms (ex. SOB, nausea, vomiting, sweating)? Some SOB   3. How long have you been experiencing CP? 3 days   4. Is your CP continuous or coming and going? Coming and going   5. Have you taken Nitroglycerin? No ?

## 2023-01-02 NOTE — Telephone Encounter (Signed)
Returned pt call and LVM to call us back. Also, advised if she continues to have chest pain or pressure and gets any other symptoms she needs to be seen at the ER.

## 2023-01-02 NOTE — Telephone Encounter (Signed)
LVM for pt to return our call

## 2023-01-02 NOTE — Telephone Encounter (Signed)
Left message for patient to call back  

## 2023-01-05 ENCOUNTER — Telehealth: Payer: Self-pay | Admitting: Home Health

## 2023-01-05 NOTE — Telephone Encounter (Signed)
Patient called after-hours line states she suffered a fall several days ago, she was walking and turned her foot in the wrong direction and lost her balance.  She had developed a significant bruise all over her arms and legs.  She had visited ER was placed in a cast for her arm.  She states that she did not suffer any fracture.  She is asking if she can use Biofreeze cream for her arm pain.  Advised patient okay to use Biofreeze cream for comfort.  Advised patient to move very slowly and cautiously to prevent future fall as she is with advanced age and blood thinner Eliquis 2.5 mg twice daily.  Advised patient to monitor current bruise for any worsening.  Shall frequent fall occurs in the future, will need to reconsider use of anticoagulation.

## 2023-01-06 NOTE — Telephone Encounter (Signed)
Returned pt call and LVM to call the office.

## 2023-02-11 ENCOUNTER — Ambulatory Visit (INDEPENDENT_AMBULATORY_CARE_PROVIDER_SITE_OTHER): Payer: Medicare Other

## 2023-02-11 DIAGNOSIS — I442 Atrioventricular block, complete: Secondary | ICD-10-CM | POA: Diagnosis not present

## 2023-02-12 LAB — CUP PACEART REMOTE DEVICE CHECK
Battery Remaining Longevity: 59 mo
Battery Voltage: 2.97 V
Brady Statistic AP VP Percent: 0 %
Brady Statistic AP VS Percent: 0 %
Brady Statistic AS VP Percent: 99.24 %
Brady Statistic AS VS Percent: 0.76 %
Brady Statistic RA Percent Paced: 0 %
Brady Statistic RV Percent Paced: 99.24 %
Date Time Interrogation Session: 20241009175427
Implantable Lead Connection Status: 753985
Implantable Lead Connection Status: 753985
Implantable Lead Implant Date: 19971124
Implantable Lead Implant Date: 20070831
Implantable Lead Location: 753858
Implantable Lead Location: 753860
Implantable Lead Model: 4024
Implantable Lead Model: 4194
Implantable Pulse Generator Implant Date: 20220214
Lead Channel Impedance Value: 3363 Ohm
Lead Channel Impedance Value: 3363 Ohm
Lead Channel Impedance Value: 361 Ohm
Lead Channel Impedance Value: 380 Ohm
Lead Channel Impedance Value: 437 Ohm
Lead Channel Impedance Value: 475 Ohm
Lead Channel Impedance Value: 551 Ohm
Lead Channel Impedance Value: 627 Ohm
Lead Channel Impedance Value: 779 Ohm
Lead Channel Pacing Threshold Amplitude: 0.875 V
Lead Channel Pacing Threshold Amplitude: 2 V
Lead Channel Pacing Threshold Pulse Width: 0.4 ms
Lead Channel Pacing Threshold Pulse Width: 1 ms
Lead Channel Sensing Intrinsic Amplitude: 0.25 mV
Lead Channel Sensing Intrinsic Amplitude: 0.25 mV
Lead Channel Sensing Intrinsic Amplitude: 8.375 mV
Lead Channel Sensing Intrinsic Amplitude: 8.375 mV
Lead Channel Setting Pacing Amplitude: 2.5 V
Lead Channel Setting Pacing Amplitude: 3 V
Lead Channel Setting Pacing Pulse Width: 0.4 ms
Lead Channel Setting Pacing Pulse Width: 1 ms
Lead Channel Setting Sensing Sensitivity: 1.2 mV
Zone Setting Status: 755011

## 2023-03-03 NOTE — Progress Notes (Signed)
Remote pacemaker transmission.   

## 2023-05-03 ENCOUNTER — Emergency Department (HOSPITAL_COMMUNITY): Payer: Medicare Other

## 2023-05-03 ENCOUNTER — Inpatient Hospital Stay (HOSPITAL_COMMUNITY)
Admission: EM | Admit: 2023-05-03 | Discharge: 2023-05-12 | DRG: 291 | Disposition: A | Discharge status: Home / Self Care | Payer: Medicare Other | Source: Skilled Nursing Facility | Attending: Family Medicine | Admitting: Family Medicine

## 2023-05-03 ENCOUNTER — Encounter (HOSPITAL_COMMUNITY): Payer: Self-pay

## 2023-05-03 ENCOUNTER — Other Ambulatory Visit: Payer: Self-pay

## 2023-05-03 DIAGNOSIS — I509 Heart failure, unspecified: Secondary | ICD-10-CM | POA: Diagnosis not present

## 2023-05-03 DIAGNOSIS — Z66 Do not resuscitate: Secondary | ICD-10-CM | POA: Diagnosis present

## 2023-05-03 DIAGNOSIS — I878 Other specified disorders of veins: Secondary | ICD-10-CM | POA: Diagnosis present

## 2023-05-03 DIAGNOSIS — Z8711 Personal history of peptic ulcer disease: Secondary | ICD-10-CM

## 2023-05-03 DIAGNOSIS — K219 Gastro-esophageal reflux disease without esophagitis: Secondary | ICD-10-CM | POA: Diagnosis present

## 2023-05-03 DIAGNOSIS — I5043 Acute on chronic combined systolic (congestive) and diastolic (congestive) heart failure: Secondary | ICD-10-CM | POA: Diagnosis present

## 2023-05-03 DIAGNOSIS — Z87891 Personal history of nicotine dependence: Secondary | ICD-10-CM

## 2023-05-03 DIAGNOSIS — D631 Anemia in chronic kidney disease: Secondary | ICD-10-CM | POA: Diagnosis present

## 2023-05-03 DIAGNOSIS — I480 Paroxysmal atrial fibrillation: Secondary | ICD-10-CM | POA: Diagnosis present

## 2023-05-03 DIAGNOSIS — R680 Hypothermia, not associated with low environmental temperature: Secondary | ICD-10-CM | POA: Diagnosis present

## 2023-05-03 DIAGNOSIS — I130 Hypertensive heart and chronic kidney disease with heart failure and stage 1 through stage 4 chronic kidney disease, or unspecified chronic kidney disease: Principal | ICD-10-CM | POA: Diagnosis present

## 2023-05-03 DIAGNOSIS — Z8249 Family history of ischemic heart disease and other diseases of the circulatory system: Secondary | ICD-10-CM

## 2023-05-03 DIAGNOSIS — Z9102 Food additives allergy status: Secondary | ICD-10-CM

## 2023-05-03 DIAGNOSIS — Z880 Allergy status to penicillin: Secondary | ICD-10-CM

## 2023-05-03 DIAGNOSIS — Z825 Family history of asthma and other chronic lower respiratory diseases: Secondary | ICD-10-CM

## 2023-05-03 DIAGNOSIS — L03115 Cellulitis of right lower limb: Secondary | ICD-10-CM | POA: Diagnosis present

## 2023-05-03 DIAGNOSIS — K5909 Other constipation: Secondary | ICD-10-CM | POA: Diagnosis present

## 2023-05-03 DIAGNOSIS — Z860101 Personal history of adenomatous and serrated colon polyps: Secondary | ICD-10-CM

## 2023-05-03 DIAGNOSIS — F5105 Insomnia due to other mental disorder: Secondary | ICD-10-CM | POA: Diagnosis present

## 2023-05-03 DIAGNOSIS — N1832 Chronic kidney disease, stage 3b: Secondary | ICD-10-CM | POA: Diagnosis present

## 2023-05-03 DIAGNOSIS — Z881 Allergy status to other antibiotic agents status: Secondary | ICD-10-CM

## 2023-05-03 DIAGNOSIS — K922 Gastrointestinal hemorrhage, unspecified: Secondary | ICD-10-CM | POA: Diagnosis present

## 2023-05-03 DIAGNOSIS — I5033 Acute on chronic diastolic (congestive) heart failure: Secondary | ICD-10-CM | POA: Diagnosis not present

## 2023-05-03 DIAGNOSIS — R195 Other fecal abnormalities: Secondary | ICD-10-CM | POA: Diagnosis present

## 2023-05-03 DIAGNOSIS — F319 Bipolar disorder, unspecified: Secondary | ICD-10-CM | POA: Diagnosis present

## 2023-05-03 DIAGNOSIS — D649 Anemia, unspecified: Secondary | ICD-10-CM | POA: Insufficient documentation

## 2023-05-03 DIAGNOSIS — Z87442 Personal history of urinary calculi: Secondary | ICD-10-CM

## 2023-05-03 DIAGNOSIS — I5031 Acute diastolic (congestive) heart failure: Secondary | ICD-10-CM | POA: Diagnosis not present

## 2023-05-03 DIAGNOSIS — I700 Atherosclerosis of aorta: Secondary | ICD-10-CM | POA: Diagnosis present

## 2023-05-03 DIAGNOSIS — M7989 Other specified soft tissue disorders: Secondary | ICD-10-CM | POA: Diagnosis present

## 2023-05-03 DIAGNOSIS — Z9049 Acquired absence of other specified parts of digestive tract: Secondary | ICD-10-CM

## 2023-05-03 DIAGNOSIS — N179 Acute kidney failure, unspecified: Secondary | ICD-10-CM | POA: Diagnosis present

## 2023-05-03 DIAGNOSIS — E871 Hypo-osmolality and hyponatremia: Secondary | ICD-10-CM | POA: Diagnosis present

## 2023-05-03 DIAGNOSIS — L03116 Cellulitis of left lower limb: Secondary | ICD-10-CM | POA: Diagnosis present

## 2023-05-03 DIAGNOSIS — Z791 Long term (current) use of non-steroidal anti-inflammatories (NSAID): Secondary | ICD-10-CM

## 2023-05-03 DIAGNOSIS — R54 Age-related physical debility: Secondary | ICD-10-CM | POA: Diagnosis present

## 2023-05-03 DIAGNOSIS — Z5982 Transportation insecurity: Secondary | ICD-10-CM

## 2023-05-03 DIAGNOSIS — I428 Other cardiomyopathies: Secondary | ICD-10-CM | POA: Diagnosis present

## 2023-05-03 DIAGNOSIS — Z91041 Radiographic dye allergy status: Secondary | ICD-10-CM

## 2023-05-03 DIAGNOSIS — Z90710 Acquired absence of both cervix and uterus: Secondary | ICD-10-CM

## 2023-05-03 DIAGNOSIS — G4700 Insomnia, unspecified: Secondary | ICD-10-CM | POA: Diagnosis present

## 2023-05-03 DIAGNOSIS — I4821 Permanent atrial fibrillation: Secondary | ICD-10-CM | POA: Diagnosis present

## 2023-05-03 DIAGNOSIS — Z95828 Presence of other vascular implants and grafts: Secondary | ICD-10-CM

## 2023-05-03 DIAGNOSIS — I4430 Unspecified atrioventricular block: Secondary | ICD-10-CM | POA: Diagnosis present

## 2023-05-03 DIAGNOSIS — M797 Fibromyalgia: Secondary | ICD-10-CM | POA: Diagnosis present

## 2023-05-03 DIAGNOSIS — Z882 Allergy status to sulfonamides status: Secondary | ICD-10-CM

## 2023-05-03 DIAGNOSIS — E039 Hypothyroidism, unspecified: Secondary | ICD-10-CM | POA: Diagnosis present

## 2023-05-03 DIAGNOSIS — H409 Unspecified glaucoma: Secondary | ICD-10-CM | POA: Diagnosis present

## 2023-05-03 DIAGNOSIS — Z79899 Other long term (current) drug therapy: Secondary | ICD-10-CM

## 2023-05-03 DIAGNOSIS — Z7901 Long term (current) use of anticoagulants: Secondary | ICD-10-CM

## 2023-05-03 DIAGNOSIS — Z681 Body mass index (BMI) 19 or less, adult: Secondary | ICD-10-CM | POA: Diagnosis not present

## 2023-05-03 DIAGNOSIS — Z515 Encounter for palliative care: Secondary | ICD-10-CM | POA: Diagnosis not present

## 2023-05-03 DIAGNOSIS — I959 Hypotension, unspecified: Secondary | ICD-10-CM | POA: Diagnosis not present

## 2023-05-03 DIAGNOSIS — Z1152 Encounter for screening for COVID-19: Secondary | ICD-10-CM

## 2023-05-03 DIAGNOSIS — Z950 Presence of cardiac pacemaker: Secondary | ICD-10-CM

## 2023-05-03 DIAGNOSIS — Z8719 Personal history of other diseases of the digestive system: Secondary | ICD-10-CM

## 2023-05-03 DIAGNOSIS — J40 Bronchitis, not specified as acute or chronic: Secondary | ICD-10-CM | POA: Diagnosis present

## 2023-05-03 DIAGNOSIS — N183 Chronic kidney disease, stage 3 unspecified: Secondary | ICD-10-CM | POA: Insufficient documentation

## 2023-05-03 DIAGNOSIS — R7989 Other specified abnormal findings of blood chemistry: Secondary | ICD-10-CM | POA: Insufficient documentation

## 2023-05-03 DIAGNOSIS — R636 Underweight: Secondary | ICD-10-CM | POA: Diagnosis present

## 2023-05-03 DIAGNOSIS — Z883 Allergy status to other anti-infective agents status: Secondary | ICD-10-CM

## 2023-05-03 DIAGNOSIS — E875 Hyperkalemia: Secondary | ICD-10-CM | POA: Diagnosis present

## 2023-05-03 DIAGNOSIS — Z9181 History of falling: Secondary | ICD-10-CM

## 2023-05-03 DIAGNOSIS — Z7189 Other specified counseling: Secondary | ICD-10-CM | POA: Diagnosis not present

## 2023-05-03 LAB — URINALYSIS, ROUTINE W REFLEX MICROSCOPIC
Bilirubin Urine: NEGATIVE
Glucose, UA: NEGATIVE mg/dL
Hgb urine dipstick: NEGATIVE
Ketones, ur: NEGATIVE mg/dL
Leukocytes,Ua: NEGATIVE
Nitrite: NEGATIVE
Protein, ur: NEGATIVE mg/dL
Specific Gravity, Urine: 1.006 (ref 1.005–1.030)
pH: 6 (ref 5.0–8.0)

## 2023-05-03 LAB — CBC
HCT: 25.6 % — ABNORMAL LOW (ref 36.0–46.0)
Hemoglobin: 8.1 g/dL — ABNORMAL LOW (ref 12.0–15.0)
MCH: 26.6 pg (ref 26.0–34.0)
MCHC: 31.6 g/dL (ref 30.0–36.0)
MCV: 83.9 fL (ref 80.0–100.0)
Platelets: 202 10*3/uL (ref 150–400)
RBC: 3.05 MIL/uL — ABNORMAL LOW (ref 3.87–5.11)
RDW: 18.2 % — ABNORMAL HIGH (ref 11.5–15.5)
WBC: 5.7 10*3/uL (ref 4.0–10.5)
nRBC: 0.5 % — ABNORMAL HIGH (ref 0.0–0.2)

## 2023-05-03 LAB — BASIC METABOLIC PANEL
Anion gap: 9 (ref 5–15)
BUN: 40 mg/dL — ABNORMAL HIGH (ref 8–23)
CO2: 25 mmol/L (ref 22–32)
Calcium: 8.7 mg/dL — ABNORMAL LOW (ref 8.9–10.3)
Chloride: 93 mmol/L — ABNORMAL LOW (ref 98–111)
Creatinine, Ser: 1.22 mg/dL — ABNORMAL HIGH (ref 0.44–1.00)
GFR, Estimated: 41 mL/min — ABNORMAL LOW (ref >60)
Glucose, Bld: 93 mg/dL (ref 70–99)
Potassium: 4.8 mmol/L (ref 3.5–5.1)
Sodium: 127 mmol/L — ABNORMAL LOW (ref 135–145)

## 2023-05-03 LAB — TROPONIN I (HIGH SENSITIVITY)
Troponin I (High Sensitivity): 16 ng/L (ref <18)
Troponin I (High Sensitivity): 15 ng/L (ref <18)

## 2023-05-03 LAB — POC OCCULT BLOOD, ED: Fecal Occult Bld: POSITIVE — AB

## 2023-05-03 LAB — RESP PANEL BY RT-PCR (RSV, FLU A&B, COVID)  RVPGX2
Influenza A by PCR: NEGATIVE
Influenza B by PCR: NEGATIVE
Resp Syncytial Virus by PCR: NEGATIVE
SARS Coronavirus 2 by RT PCR: NEGATIVE

## 2023-05-03 LAB — BRAIN NATRIURETIC PEPTIDE: B Natriuretic Peptide: 598 pg/mL — ABNORMAL HIGH (ref 0.0–100.0)

## 2023-05-03 LAB — CBG MONITORING, ED: Glucose-Capillary: 94 mg/dL (ref 70–99)

## 2023-05-03 MED ORDER — PANTOPRAZOLE SODIUM 40 MG PO TBEC
40.0000 mg | DELAYED_RELEASE_TABLET | Freq: Every day | ORAL | Status: DC
Start: 2023-05-04 — End: 2023-05-13
  Administered 2023-05-04 – 2023-05-12 (×9): 40 mg via ORAL
  Filled 2023-05-03 (×9): qty 1

## 2023-05-03 MED ORDER — ONDANSETRON HCL 4 MG/2ML IJ SOLN
4.0000 mg | Freq: Four times a day (QID) | INTRAMUSCULAR | Status: DC | PRN
  Administered 2023-05-05: 4 mg via INTRAVENOUS
  Filled 2023-05-03: qty 2

## 2023-05-03 MED ORDER — FUROSEMIDE 10 MG/ML IJ SOLN
60.0000 mg | Freq: Once | INTRAMUSCULAR | Status: AC
  Administered 2023-05-04: 60 mg via INTRAVENOUS
  Filled 2023-05-03: qty 8

## 2023-05-03 MED ORDER — ONDANSETRON HCL 4 MG PO TABS: 4.0000 mg | ORAL_TABLET | Freq: Four times a day (QID) | ORAL | Status: DC | PRN

## 2023-05-03 MED ORDER — OXYCODONE HCL 5 MG PO TABS
5.0000 mg | ORAL_TABLET | ORAL | Status: DC | PRN
  Administered 2023-05-05 – 2023-05-09 (×7): 5 mg via ORAL
  Filled 2023-05-03 (×7): qty 1

## 2023-05-03 MED ORDER — FUROSEMIDE 10 MG/ML IJ SOLN
40.0000 mg | Freq: Once | INTRAMUSCULAR | Status: AC
  Administered 2023-05-03: 40 mg via INTRAVENOUS
  Filled 2023-05-03: qty 4

## 2023-05-03 MED ORDER — TRAZODONE HCL 50 MG PO TABS
50.0000 mg | ORAL_TABLET | Freq: Every day | ORAL | Status: DC
  Administered 2023-05-04 – 2023-05-11 (×9): 50 mg via ORAL
  Filled 2023-05-03 (×9): qty 1

## 2023-05-03 MED ORDER — ACETAMINOPHEN 325 MG PO TABS
650.0000 mg | ORAL_TABLET | Freq: Four times a day (QID) | ORAL | Status: DC | PRN
  Administered 2023-05-04 – 2023-05-11 (×9): 650 mg via ORAL
  Filled 2023-05-03 (×9): qty 2

## 2023-05-03 MED ORDER — CARVEDILOL 6.25 MG PO TABS
6.2500 mg | ORAL_TABLET | Freq: Two times a day (BID) | ORAL | Status: DC
  Administered 2023-05-04: 6.25 mg via ORAL
  Filled 2023-05-03: qty 1

## 2023-05-03 MED ORDER — SERTRALINE HCL 50 MG PO TABS
50.0000 mg | ORAL_TABLET | Freq: Every day | ORAL | Status: DC
  Administered 2023-05-04 – 2023-05-12 (×9): 50 mg via ORAL
  Filled 2023-05-03 (×9): qty 1

## 2023-05-03 MED ORDER — ACETAMINOPHEN 650 MG RE SUPP: 650.0000 mg | Freq: Four times a day (QID) | RECTAL | Status: DC | PRN

## 2023-05-03 MED ORDER — APIXABAN 2.5 MG PO TABS
2.5000 mg | ORAL_TABLET | Freq: Two times a day (BID) | ORAL | Status: DC
Start: 2023-05-03 — End: 2023-05-04
  Administered 2023-05-04: 2.5 mg via ORAL
  Filled 2023-05-03: qty 1

## 2023-05-03 MED ORDER — MIRTAZAPINE 15 MG PO TABS
15.0000 mg | ORAL_TABLET | Freq: Every day | ORAL | Status: DC
  Administered 2023-05-04 – 2023-05-11 (×9): 15 mg via ORAL
  Filled 2023-05-03 (×5): qty 1
  Filled 2023-05-03: qty 2
  Filled 2023-05-03 (×3): qty 1

## 2023-05-03 NOTE — ED Provider Notes (Signed)
[name] AT  [facility] Provider Note   CSN: [id number] Arrival date & time: 05/03/23  1733     History  Chief Complaint  Patient presents with   Dizziness    Kimberly [name] is a 89 y.o. female.  Patient is a 89 year old female with past medical history of CHF, A-fib on Eliquis, complete heart block status post pacemaker placement, CKD presenting to the emergency department with dizziness and shortness of breath.  Patient states over the last few days she has had increasing shortness of breath.  She states that she does have a productive cough.  She states that she has been feeling more dizzy and lightheaded.  She states that if she puts her head down this helps the dizziness go away.  She denies any associated chest pain.  She denies any fevers.  She states she has also had significant lower extremity swelling.  She states that she has been taking her Lasix daily as prescribed.  The history is provided by the patient.  Dizziness      Home Medications Prior to Admission medications   Medication Sig Start Date End Date Taking? Authorizing Provider  apixaban (ELIQUIS) 2.5 MG TABS tablet Take 1 tablet (2.5 mg total) by mouth 2 (two) times daily. 09/01/22  Yes Croitoru, Mihai, MD  BLINK TEARS 0.25 % SOLN Place 1 drop into both eyes 3 (three) times daily.   Yes [provider]  calcium carbonate (TUMS - DOSED IN MG ELEMENTAL CALCIUM) 500 MG chewable tablet Chew 1 tablet by mouth every 8 (eight) hours as needed for indigestion or heartburn.   Yes [provider]  carvedilol (COREG) 6.25 MG tablet TAKE 1 TABLET BY MOUTH TWICE  DAILY WITH MEALS 12/19/22  Yes Croitoru, Mihai, MD  dicyclomine (BENTYL) 20 MG tablet Take 20 mg by mouth every 8 (eight) hours as needed (for gas).   Yes [provider]  ELIQUIS 2.5 MG TABS tablet TAKE 1 TABLET BY MOUTH TWICE  DAILY 12/29/22  Yes Croitoru, Mihai, MD  furosemide (LASIX) 20 MG tablet Take 1  tablet (20 mg total) by mouth daily as needed for edema (for worsening leg swelling or shortness of breath). Patient taking differently: Take 20 mg by mouth 2 (two) times daily. 10/24/21  Yes Arrien, York Ram, MD  meloxicam (MOBIC) 7.5 MG tablet Take 7.5 mg by mouth every 12 (twelve) hours as needed (or osteoarthritis).   Yes [provider]  mirtazapine (REMERON) 15 MG tablet Take 15 mg by mouth at bedtime.   Yes [provider]  Multiple Vitamins-Minerals (OCUVITE ADULT FORMULA) CAPS Take 1 capsule by mouth in the morning.   Yes [provider]  Multiple Vitamins-Minerals (PRESERVISION AREDS 2) CAPS Take 1 capsule by mouth every 12 (twelve) hours.   Yes [provider]  omeprazole (PRILOSEC) 20 MG capsule Take 1 capsule (20 mg total) by mouth daily. Patient taking differently: Take 20 mg by mouth daily before breakfast. 12/30/22  Yes Croitoru, Mihai, MD  sertraline (ZOLOFT) 50 MG tablet Take 50 mg by mouth in the morning.   Yes [provider]  SYSTANE 0.4-0.3 % GEL ophthalmic gel Place 1 Application into both eyes every 8 (eight) hours as needed (for dryness or irritation).   Yes [provider]  traMADol (ULTRAM) 50 MG tablet Take 50 mg by mouth every 12 (twelve) hours as needed for moderate pain (pain score 4-6) or severe pain (pain score 7-10).   Yes [provider]  dicyclomine (BENTYL) 10 MG capsule Take 1 capsule (10 mg total) by mouth 3 (three) times daily before meals. Patient not taking: Reported on 05/03/2023 05/24/21   Triplett, Tammy, PA-C  potassium chloride (KLOR-CON M) 10 MEQ tablet Take 1 tablet (10 mEq total) by mouth daily as needed (take with furosemide). Patient not taking: Reported on 05/03/2023 10/24/21   Arrien, York Ram, MD  tiZANidine (ZANAFLEX) 4 MG tablet Take 1 tablet (4 mg total) by mouth every 6 (six) hours as needed (for headaches). Patient not taking: Reported on 05/03/2023 10/03/21   Alberteen Sam, MD  zolpidem (AMBIEN) 5 MG tablet Take 1 tablet (5 mg total) by mouth at bedtime. Patient not taking: Reported on 05/03/2023 10/03/21   Alberteen Sam, MD      Allergies    Penicillins, Ceftriaxone, Camphor, Ciprofloxacin, Flagyl [metronidazole], Papaya derivatives, Iodine, and Sulfa antibiotics    Review of Systems   Review of Systems  Neurological:  Positive for dizziness.    Physical Exam Updated Vital Signs BP 138/60   Pulse (!) 58   Temp (!) 94.3 F (34.6 C) (Rectal)   Resp 18   Ht 5\' 7"  (1.702 m)   Wt 56.7 kg   SpO2 94%   BMI 19.58 kg/m  Physical Exam Vitals and nursing note reviewed.  Constitutional:      General: She is not in acute distress.    Appearance: Normal appearance.  HENT:     Head: Normocephalic and atraumatic.     Nose: Nose normal.     Mouth/Throat:     Mouth: Mucous membranes are moist.     Pharynx: Oropharynx is clear.  Eyes:     Extraocular Movements: Extraocular movements intact.     Conjunctiva/sclera: Conjunctivae normal.  Neck:     Comments: Significantly elevated JVD to earlobe Cardiovascular:     Rate and Rhythm: Normal rate and regular rhythm.     Heart sounds: Normal heart sounds.  Pulmonary:     Effort: Pulmonary effort is normal.     Breath sounds: Rhonchi (Bilateral bases) present.  Abdominal:     General: Abdomen is flat.     Palpations: Abdomen is soft.     Tenderness: There is no abdominal tenderness.  Musculoskeletal:        General: Normal range of motion.     Cervical back: Normal range of motion and neck supple.     Right lower leg: Edema (3+ pitting) present.     Left lower leg: Edema (3+ pitting) present.  Skin:    General: Skin is warm and dry.  Neurological:     General: No focal deficit present.     Mental Status: She is alert and oriented to person, place, and time.  Psychiatric:        Mood and Affect: Mood normal.        Behavior: Behavior normal.     ED Results / Procedures /  Treatments   Labs (all labs ordered are listed, but only abnormal results are displayed) Labs Reviewed  BASIC METABOLIC PANEL - Abnormal; Notable for the following components:      Result Value   Sodium 127 (*)    Chloride 93 (*)    BUN 40 (*)    Creatinine, Ser 1.22 (*)    Calcium 8.7 (*)    GFR, Estimated 41 (*)    All other components within normal limits  CBC - Abnormal; Notable for the following components:  RBC 3.05 (*)    Hemoglobin 8.1 (*)    HCT 25.6 (*)    RDW 18.2 (*)    nRBC 0.5 (*)    All other components within normal limits  URINALYSIS, ROUTINE W REFLEX MICROSCOPIC - Abnormal; Notable for the following components:   Color, Urine STRAW (*)    All other components within normal limits  BRAIN NATRIURETIC PEPTIDE - Abnormal; Notable for the following components:   B Natriuretic Peptide 598.0 (*)    All other components within normal limits  POC OCCULT BLOOD, ED - Abnormal; Notable for the following components:   Fecal Occult Bld POSITIVE (*)    All other components within normal limits  RESP PANEL BY RT-PCR (RSV, FLU A&B, COVID)  [id number]  BASIC METABOLIC PANEL  CBC  CBG MONITORING, ED  TROPONIN I (HIGH SENSITIVITY)  TROPONIN I (HIGH SENSITIVITY)    EKG EKG Interpretation Date/Time:  Sunday May 03 2023 17:46:05 EST Ventricular Rate:  60 PR Interval:    QRS Duration:  149 QT Interval:  496 QTC Calculation: 496 R Axis:   145  Text Interpretation: Atrial fibrillation VENTRICULAR PACED RHYTHM RBBB and LPFB Abnormal lateral Q waves No significant change since last tracing Confirmed by Elayne Snare ([id number]) on 05/03/2023 6:14:30 PM  Radiology DG Chest Port 1 View Result Date: 05/03/2023 CLINICAL DATA:  Dizziness and shortness of breath. Weeping edema both lower extremities. EXAM: PORTABLE CHEST 1 VIEW COMPARISON:  Portable chest 01/05/2023, 01/01/2023, 12/23/2022 FINDINGS: Left chest dual lead pacemaker, wiring and additional AID wire are unaltered. The  heart is enlarged. There is increased central vascular prominence and flow cephalization, interval development of mild basilar and central interstitial edema, small pleural effusions. No focal airspace disease is seen. Bilateral diaphragmatic eventrations are again noted. The aorta is tortuous and calcified with stable mediastinum. Chronic biapical pleural-parenchymal scarring and calcifications are again shown. No new osseous findings. Osteopenia and degenerative change of the spine. IMPRESSION: 1. Cardiomegaly with increased central vascular prominence and flow cephalization, interval development of mild basilar and central interstitial edema, small pleural effusions. Findings are consistent with CHF. 2. Aortic atherosclerosis with heavy calcification, uncoiling. Electronically Signed   By: Almira Bar M.D.   On: 05/03/2023 22:05    Procedures Procedures    Medications Ordered in ED Medications  furosemide (LASIX) injection 60 mg (has no administration in time range)  carvedilol (COREG) tablet 6.25 mg (has no administration in time range)  mirtazapine (REMERON) tablet 15 mg (has no administration in time range)  sertraline (ZOLOFT) tablet 50 mg (has no administration in time range)  pantoprazole (PROTONIX) EC tablet 40 mg (has no administration in time range)  apixaban (ELIQUIS) tablet 2.5 mg (has no administration in time range)  acetaminophen (TYLENOL) tablet 650 mg (has no administration in time range)    Or  acetaminophen (TYLENOL) suppository 650 mg (has no administration in time range)  oxyCODONE (Oxy IR/ROXICODONE) immediate release tablet 5 mg (has no administration in time range)  ondansetron (ZOFRAN) tablet 4 mg (has no administration in time range)    Or  ondansetron (ZOFRAN) injection 4 mg (has no administration in time range)  furosemide (LASIX) injection 60 mg (has no administration in time range)  furosemide (LASIX) injection 40 mg (40 mg Intravenous Given 05/03/23 2042)     ED Course/ Medical Decision Making/ A&P Clinical Course as of 05/03/23 2327  Sun May 03, 2023  1913 Mild anemia from baseline, Hgb 9.6 in Sept 2024. Patient does report she  has had some black stools. Hemoccult will be sent. [VK]  2010 Elevated BNP consistent with CHF exacerbation, mild hyponatremia. [VK]  2105 Hemoccult positive. I spoke with Dr. Lorenso Quarry and [facility] will see in the morning. [VK]    Clinical Course User Index [VK] Rexford Maus, DO                                 Medical Decision Making This patient presents to the ED with chief complaint(s) of shortness of breath, dizziness with pertinent past medical history of CHF, CHB status post pacemaker placement, A-fib on Eliquis, CKD which further complicates the presenting complaint. The complaint involves an extensive differential diagnosis and also carries with it a high risk of complications and morbidity.    The differential diagnosis includes arrhythmia, anemia, dehydration, electrolyte abnormality, CHF exacerbation, pulmonary edema, pleural effusion, pneumonia, pneumothorax, viral syndrome  Additional history obtained: Additional history obtained from N/A Records reviewed previous admission documents and Care Everywhere/External Records  ED Course and Reassessment: On patient's arrival she was hypothermic and otherwise hemodynamically stable in no acute distress.  EKG showed ventricularly paced rhythm with underlying atrial fibrillation, no acute ischemic changes.  The patient does appear significantly volume overloaded.  Patient will have labs, chest x-ray and viral swab performed.  She will be started on IV Lasix and will be closely reassessed.  Independent labs interpretation:  The following labs were independently interpreted: acute on chronic anemia, elevated BNP, mild hyponatremia, hemoccult positive stools  Independent visualization of imaging: - I independently visualized the following imaging with  scope of interpretation limited to determining acute life threatening conditions related to emergency care: CXR, which revealed findings consistent with CHF  Consultation: - Consulted or discussed management/test interpretation w/ external professional: hospitalist  Consideration for admission or further workup: patient requires admission for CHF exacerbation and GI bleed Social Determinants of health: N/A    Amount and/or Complexity of Data Reviewed Labs: ordered. Radiology: ordered.  Risk Prescription drug management. Decision regarding hospitalization.          Final Clinical Impression(s) / ED Diagnoses Final diagnoses:  Acute on chronic congestive heart failure, unspecified heart failure type [facility])  Gastrointestinal hemorrhage, unspecified gastrointestinal hemorrhage type    Rx / DC Orders ED Discharge Orders     None         Jacie, [place], DO 05/03/23 2327

## 2023-05-03 NOTE — H&P (Signed)
History and Physical    MARGARETH MEDEIROS ZOX:[id number] DOB: 1927/04/21 DOA: 05/03/2023  PCP: Rebekah Chesterfield, NP   Chief Complaint:  sob, dizziness, leg swelling  HPI: Kimberly [name] is a 89 y.o. female with medical history significant of CHF, A-fib, bipolar, hypertension who presents emergency department dizziness shortness of breath and leg swelling.  Patient states over the last week she has had weeping in her bilateral lower extremities as well as weight gain.  Due to discomfort with her legs she presented to the ER where she was found to be afebrile hemodynamically stable.  Labs were obtained which demonstrated sodium 127, creatinine 1.2, calcium 8.7, WBC 5.7, hemoglobin 8.1 baseline between 8 and 9, BNP 598, troponin 16.  Urinalysis negative for infection.  Patient had chest x-ray which showed cardiomegaly with increased vascular prominence concerning for volume overload.  Patient was given IV Lasix.  On admission patient endorses taking Lasix 20 mg twice daily.  Has been peeing but not heavily.  She denies any infectious complaints including fever or chills.  She denies any overt GI bleeding.   Review of Systems: Review of Systems  Constitutional:  Negative for chills, fever and weight loss.  HENT: Negative.    Eyes: Negative.   Respiratory:  Positive for shortness of breath.   Cardiovascular: Negative.   Gastrointestinal: Negative.   Genitourinary: Negative.   Musculoskeletal: Negative.   Skin: Negative.   Neurological: Negative.   Endo/Heme/Allergies: Negative.   Psychiatric/Behavioral: Negative.    All other systems reviewed and are negative.    As per HPI otherwise 10 point review of systems negative.   Allergies  Allergen Reactions   Penicillins Hives    DID THE REACTION INVOLVE: Swelling of the face/tongue/throat, SOB, or low BP? Unknown  Sudden or severe rash/hives, skin peeling, or the inside of the mouth or nose? Yes  Did it require medical treatment? Yes  When  did it last happen?    27 or 89 years old    If all above answers are NO, may proceed with cephalosporin use.  DID THE REACTION INVOLVE: Swelling of the face/tongue/throat, SOB, or low BP? Unknown  Sudden or severe rash/hives, skin peeling, or the inside of the mouth or nose? Yes  Did it require medical treatment? Yes  When did it last happen?    22 or 89 years old    If all above answers are "NO", may proceed with cephalosporin use.   Ceftriaxone Itching   Camphor Other (See Comments)    "Allergic," per facility   Ciprofloxacin Diarrhea and Other (See Comments)    Possibly caused diarrhea November 2018   Flagyl [Metronidazole] Other (See Comments)    Possibly caused diarrhea November 2018   Papaya Derivatives Hives   Iodine Rash and Other (See Comments)    If injected,  Rash/irritated skin reaction "welts"   Sulfa Antibiotics Rash    Past Medical History:  Diagnosis Date   Acute blood loss anemia 10/11/2012   Acute diverticulitis 08/24/2013   Acute on chronic combined systolic and diastolic CHF, NYHA class 4 (HCC) 11/15/2013   Antral ulcer 10/11/2012   Aortic atherosclerosis (HCC)    Atrial fibrillation (HCC)    Bipolar affective disorder (HCC) 03/23/2017   Cardiomyopathy, nonischemic (HCC)    Chronic anticoagulation 10/12/2012   CKD (chronic kidney disease) stage 3, GFR 30-59 ml/min (HCC) 10/12/2012   Depression    Diverticulosis    DVT (deep venous thrombosis) (HCC)    left peroneal  veins/IVC Filter placed as per hospital discharge 10/24/21   Erosive esophagitis 10/11/2012   Esophagitis    Fibromyalgia    Glaucoma    H/O echocardiogram 2007   EF 40-45%,          Hiatal hernia    History of anemia due to CKD    Hypertension    Internal hemorrhoids    Nephrolithiasis    Osteoarthritis    Pacemaker    Last saw cards 07/2013   Prepyloric ulcer    Scoliosis    Tubular adenoma of colon     Past Surgical History:  Procedure Laterality Date   ABDOMINAL  HYSTERECTOMY     APPENDECTOMY     BACK SURGERY     BIOPSY  03/03/2017   Procedure: BIOPSY;  Surgeon: West Bali, MD;  Location: [facility];  Service: Endoscopy;;  gastric   BREAST SURGERY     CARDIAC CATHETERIZATION  12/08/2005   LAD AND LEFT MAIN WITH NO HIGH-GRADE STENOSIS. MILD DISEASE IN THE CX AND LAD SYSTEM. SEVERE LV DYSFUNCTION WITH DILATION OF THE LV. EF 15-20%. LV END-DIASTOLIC PRESSURE IS 90. +1 MR.   CHOLECYSTECTOMY     COLONOSCOPY N/A 03/03/2017   Dr. Darrick Penna: 5 colon polyps removed, adenomatous.  Diverticulosis.  99% of the colon was cleared but the cecum was not adequately seen.   CYSTOSCOPY N/A 02/24/2013   Procedure: CYSTOSCOPY WITH URETHRAL DILITATION;  Surgeon: Ky Barban, MD;  Location: [facility];  Service: Urology;  Laterality: N/A;   DOPPLER ECHOCARDIOGRAPHY N/A 05/30/2010   LV SIZE IS NORMAL. LV SYSTOLIC FUNCTION IS LOW NORMAL. EF=50-55%. MILD INFERIOR HYPOKINESIS.MILD TO MODERATE POSTERIOR WALL HYPOKINESIS.PACEMAKER LEAD IN THE RV. LA IS MILDLY DILATED. RA IS MODERATE TO SEVERLY DILATED. PACEMAKER LEAD IN THE RA. MILD CALCICICATION OF THE MV APPARATUS. MODERATE MR. MILD TO MODERATE TR. MILD PHTN.AV MILDLY SCLEROTIC.   ESOPHAGOGASTRODUODENOSCOPY N/A 10/13/2012   Dr. Jena Gauss: severe ulcerative reflux esophagitis, question of Barrett's but negative path, single deep prepyloric antral ulcer, negative H.pylori   ESOPHAGOGASTRODUODENOSCOPY N/A 03/03/2017   Dr. Darrick Penna: Gastritis/duodenitis, no H. pylori   ESOPHAGOGASTRODUODENOSCOPY (EGD) WITH PROPOFOL N/A 10/01/2021   Procedure: ESOPHAGOGASTRODUODENOSCOPY (EGD) WITH PROPOFOL;  Surgeon: Beverley Fiedler, MD;  Location[facility];  Service: Gastroenterology;  Laterality: N/A;   HERNIA REPAIR     right inguinal hernia and umbilical   LOWETR EXT VENOUS Bilateral 11-08-10   R & L- NO EVIDENCE OF THROMBUS OR THROMBOPHLEBITIS. THERE IS MILD AMOUNT OF SUBCUTANEOUS EDEMA NOTED WITHIN THE LEFT CALF AND ANKLE. R & L GSV AND SSV- NO  VENOUS INSUFF NOTED.   NECK SURGERY     NUCLEAR STRESS TEST N/A 02/13/2009   NORMAL PATTERN OF PERFUSION IN ALL REGIONS. POST STRESS VENTICULAR SIZE IS NORMAL. POST  STESS EF 85%.  NORMAL MYOCARDIAL PERFUSION STUDY.   OPEN REDUCTION INTERNAL FIXATION (ORIF) DISTAL PHALANX Left 11/16/2018   Procedure: MIDDLE FINGER OPEN REDUCTION VERSUS RECONSTRUCTION;  Surgeon: Dominica Severin, MD;  Location: [facility];  Service: Orthopedics;  Laterality: Left;   PACEMAKER INSERTION     POLYPECTOMY  03/03/2017   Procedure: POLYPECTOMY;  Surgeon: West Bali, MD;  Location: [facility];  Service: Endoscopy;;  colon   PPM GENERATOR CHANGEOUT N/A 06/18/2020   Procedure: PPM GENERATOR CHANGEOUT;  Surgeon: Thurmon Fair, MD;  Location: [facility];  Service: Cardiovascular;  Laterality: N/A;   TONSILLECTOMY     YAG LASER APPLICATION Bilateral 11/20/2014   Procedure: YAG LASER  APPLICATION;  Surgeon: Susa Simmonds, MD;  Location: [facility];  Service: Ophthalmology;  Laterality: Bilateral;     reports that she quit smoking about 18 years ago. Her smoking use included cigarettes. She started smoking about 48 years ago. She has a 15 pack-year smoking history. She has never used smokeless tobacco. She reports that she does not currently use alcohol. She reports that she does not use drugs.  Family History  Problem Relation Age of Onset   Cancer Sister    Asthma Sister    Heart failure Brother    Pulmonary embolism Brother    Cancer Brother    Colon cancer Neg Hx     Prior to Admission medications   Medication Sig Start Date End Date Taking? Authorizing Provider  apixaban (ELIQUIS) 2.5 MG TABS tablet Take 1 tablet (2.5 mg total) by mouth 2 (two) times daily. 09/01/22  Yes Croitoru, Mihai, MD  BLINK TEARS 0.25 % SOLN Place 1 drop into both eyes 3 (three) times daily.   Yes [provider]  calcium carbonate (TUMS - DOSED IN MG ELEMENTAL CALCIUM) 500 MG chewable tablet Chew 1 tablet by mouth every 8  (eight) hours as needed for indigestion or heartburn.   Yes [provider]  carvedilol (COREG) 6.25 MG tablet TAKE 1 TABLET BY MOUTH TWICE  DAILY WITH MEALS 12/19/22  Yes Croitoru, Mihai, MD  dicyclomine (BENTYL) 20 MG tablet Take 20 mg by mouth every 8 (eight) hours as needed (for gas).   Yes [provider]  ELIQUIS 2.5 MG TABS tablet TAKE 1 TABLET BY MOUTH TWICE  DAILY 12/29/22  Yes Croitoru, Mihai, MD  furosemide (LASIX) 20 MG tablet Take 1 tablet (20 mg total) by mouth daily as needed for edema (for worsening leg swelling or shortness of breath). Patient taking differently: Take 20 mg by mouth 2 (two) times daily. 10/24/21  Yes Arrien, York Ram, MD  meloxicam (MOBIC) 7.5 MG tablet Take 7.5 mg by mouth every 12 (twelve) hours as needed (or osteoarthritis).   Yes [provider]  mirtazapine (REMERON) 15 MG tablet Take 15 mg by mouth at bedtime.   Yes [provider]  Multiple Vitamins-Minerals (OCUVITE ADULT FORMULA) CAPS Take 1 capsule by mouth in the morning.   Yes [provider]  Multiple Vitamins-Minerals (PRESERVISION AREDS 2) CAPS Take 1 capsule by mouth every 12 (twelve) hours.   Yes [provider]  omeprazole (PRILOSEC) 20 MG capsule Take 1 capsule (20 mg total) by mouth daily. Patient taking differently: Take 20 mg by mouth daily before breakfast. 12/30/22  Yes Croitoru, Mihai, MD  sertraline (ZOLOFT) 50 MG tablet Take 50 mg by mouth in the morning.   Yes [provider]  SYSTANE 0.4-0.3 % GEL ophthalmic gel Place 1 Application into both eyes every 8 (eight) hours as needed (for dryness or irritation).   Yes [provider]  traMADol (ULTRAM) 50 MG tablet Take 50 mg by mouth every 12 (twelve) hours as needed for moderate pain (pain score 4-6) or severe pain (pain score 7-10).   Yes [provider]  dicyclomine (BENTYL) 10 MG capsule Take 1 capsule (10 mg total) by mouth 3 (three) times daily before  meals. Patient not taking: Reported on 05/03/2023 05/24/21   Triplett, Tammy, PA-C  potassium chloride (KLOR-CON M) 10 MEQ tablet Take 1 tablet (10 mEq total) by mouth daily as needed (take with furosemide). Patient not taking: Reported on 05/03/2023 10/24/21   Arrien, York Ram, MD  tiZANidine (ZANAFLEX) 4 MG tablet Take 1 tablet (4 mg total) by mouth every 6 (six) hours as needed (for headaches). Patient not taking: Reported on 05/03/2023 10/03/21   Alberteen Sam, MD  zolpidem (AMBIEN) 5 MG tablet Take 1 tablet (5 mg total) by mouth at bedtime. Patient not taking: Reported on 05/03/2023 10/03/21   Alberteen Sam, MD    Physical Exam: Vitals:   05/03/23 1748 05/03/23 1753 05/03/23 2100  BP: (!) 154/76  138/60  Pulse: (!) 59  (!) 58  Resp: 20  18  Temp: (!) 94.3 F (34.6 C)    TempSrc: Rectal    SpO2: 99%  94%  Weight:  56.7 kg   Height:  5\' 7"  (1.702 m)    Physical Exam Constitutional:      Appearance: She is normal weight.  HENT:     Head: Normocephalic.     Nose: Nose normal.     Mouth/Throat:     Mouth: Mucous membranes are moist.     Pharynx: Oropharynx is clear.  Eyes:     Conjunctiva/sclera: Conjunctivae normal.     Pupils: Pupils are equal, round, and reactive to light.  Cardiovascular:     Rate and Rhythm: Normal rate and regular rhythm.     Pulses: Normal pulses.     Heart sounds: Normal heart sounds.  Pulmonary:     Effort: Pulmonary effort is normal.     Breath sounds: Normal breath sounds.  Abdominal:     General: Abdomen is flat. Bowel sounds are normal.  Musculoskeletal:        General: Normal range of motion.     Cervical back: Normal range of motion.  Skin:    General: Skin is warm.     Capillary Refill: Capillary refill takes less than 2 seconds.  Neurological:     General: No focal deficit present.     Mental Status: She is alert. Mental status is at baseline.  Psychiatric:        Mood and Affect: Mood normal.       Labs  on Admission: I have personally reviewed the patients's labs and imaging studies.  Assessment/Plan Principal Problem:   CHF (congestive heart failure) (HCC)   # Acute on chronic heart failure with reduced ejection fraction, POA, active - Patient presenting with worsening leg swelling and weight gain - BNP elevated - Chest imaging shows effusions  Plan: Administer IV Lasix Last echocardiogram was obtained several years prior.  Will repeat  # Chronic anemia - Patient's hemoglobin found to be 8.1.  4 months ago was 11.4 however it was 8 years ago.  No overt bleeding.  Given age patient likely does not need further workup I would just trend hemoglobin.  Once develops overt bleeding could consider further evaluation.  Hemoglobin check ordered for morning.  #CKD3b- trend Cr  #HTN- continue coreg  #insomnia- continue remeron  #Paroxysmal afib- continue elqiuis, coreg  #GERD- continue PPI  #Depression- continue zoloft  #Hyponaturemia- trend na, likely hypervolemic  Admission status: Inpatient Telemetry  Certification: The appropriate patient status for this patient is INPATIENT. Inpatient status is judged to be reasonable and necessary in order to provide the required intensity of service to ensure the patient's safety. The patient's presenting symptoms, physical exam findings, and initial radiographic and laboratory data in the context of their chronic comorbidities is felt to place them at high risk for further clinical deterioration. Furthermore, it is not anticipated that the patient will be medically  stable for discharge from the hospital within 2 midnights of admission.   * I certify that at the point of admission it is my clinical judgment that the patient will require inpatient hospital care spanning beyond 2 midnights from the point of admission due to high intensity of service, high risk for further deterioration and high frequency of surveillance required.Alan Mulder MD [facility] If 7PM-7AM, please contact night-coverage [email or web address]  05/03/2023, 11:15 PM

## 2023-05-03 NOTE — ED Triage Notes (Signed)
Pt BIB [facility] coming from [facility]. Pt presents with intermittent dizziness with associated ShOB x 1 week. Pt has hx of pacemaker placement and EMS report EKG is showing paced at 60. Pt also has hx of CHF and has weeping edema to BLE.  Pt A/Ox4 on arrival.   EMS Vitals  163/77 SpO2 98% on R/A CBG 133

## 2023-05-04 ENCOUNTER — Inpatient Hospital Stay (HOSPITAL_COMMUNITY): Payer: Medicare Other

## 2023-05-04 DIAGNOSIS — I509 Heart failure, unspecified: Secondary | ICD-10-CM

## 2023-05-04 DIAGNOSIS — I5033 Acute on chronic diastolic (congestive) heart failure: Secondary | ICD-10-CM | POA: Diagnosis not present

## 2023-05-04 LAB — CBC
HCT: 24.3 % — ABNORMAL LOW (ref 36.0–46.0)
Hemoglobin: 7.9 g/dL — ABNORMAL LOW (ref 12.0–15.0)
MCH: 26.9 pg (ref 26.0–34.0)
MCHC: 32.5 g/dL (ref 30.0–36.0)
MCV: 82.7 fL (ref 80.0–100.0)
Platelets: 177 10*3/uL (ref 150–400)
RBC: 2.94 MIL/uL — ABNORMAL LOW (ref 3.87–5.11)
RDW: 18.1 % — ABNORMAL HIGH (ref 11.5–15.5)
WBC: 4.9 10*3/uL (ref 4.0–10.5)
nRBC: 0.4 % — ABNORMAL HIGH (ref 0.0–0.2)

## 2023-05-04 LAB — BASIC METABOLIC PANEL
Anion gap: 9 (ref 5–15)
BUN: 42 mg/dL — ABNORMAL HIGH (ref 8–23)
CO2: 28 mmol/L (ref 22–32)
Calcium: 8.6 mg/dL — ABNORMAL LOW (ref 8.9–10.3)
Chloride: 90 mmol/L — ABNORMAL LOW (ref 98–111)
Creatinine, Ser: 1.29 mg/dL — ABNORMAL HIGH (ref 0.44–1.00)
GFR, Estimated: 38 mL/min — ABNORMAL LOW (ref >60)
Glucose, Bld: 103 mg/dL — ABNORMAL HIGH (ref 70–99)
Potassium: 4 mmol/L (ref 3.5–5.1)
Sodium: 127 mmol/L — ABNORMAL LOW (ref 135–145)

## 2023-05-04 LAB — ECHOCARDIOGRAM COMPLETE
AR max vel: 1.49 cm2
AV Area VTI: 1.14 cm2
AV Area mean vel: 1.2 cm2
AV Mean grad: 4 mm[Hg]
AV Peak grad: 7.1 mm[Hg]
Ao pk vel: 1.33 m/s
Area-P 1/2: 2.59 cm2
Height: 67 in
S' Lateral: 3.1 cm
Weight: 2000 [oz_av]

## 2023-05-04 LAB — CORTISOL: Cortisol, Plasma: 12.4 ug/dL

## 2023-05-04 LAB — RETICULOCYTES
Immature Retic Fract: 32.7 % — ABNORMAL HIGH (ref 2.3–15.9)
RBC.: 3.11 MIL/uL — ABNORMAL LOW (ref 3.87–5.11)
Retic Count, Absolute: 54.1 10*3/uL (ref 19.0–186.0)
Retic Ct Pct: 1.7 % (ref 0.4–3.1)

## 2023-05-04 LAB — TSH: TSH: 5.234 u[IU]/mL — ABNORMAL HIGH (ref 0.350–4.500)

## 2023-05-04 LAB — IRON AND TIBC
Iron: 28 ug/dL (ref 28–170)
Saturation Ratios: 6 % — ABNORMAL LOW (ref 10.4–31.8)
TIBC: 484 ug/dL — ABNORMAL HIGH (ref 250–450)
UIBC: 456 ug/dL

## 2023-05-04 LAB — MRSA NEXT GEN BY PCR, NASAL: MRSA by PCR Next Gen: NOT DETECTED

## 2023-05-04 LAB — FERRITIN: Ferritin: 36 ng/mL (ref 11–307)

## 2023-05-04 LAB — FOLATE: Folate: 11.7 ng/mL (ref >5.9)

## 2023-05-04 LAB — VITAMIN B12: Vitamin B-12: 288 pg/mL (ref 180–914)

## 2023-05-04 MED ORDER — DOXYCYCLINE HYCLATE 100 MG PO TABS
100.0000 mg | ORAL_TABLET | Freq: Two times a day (BID) | ORAL | Status: DC
  Administered 2023-05-04 – 2023-05-11 (×14): 100 mg via ORAL
  Filled 2023-05-04 (×14): qty 1

## 2023-05-04 MED ORDER — ORAL CARE MOUTH RINSE
15.0000 mL | OROMUCOSAL | Status: DC | PRN
Start: 2023-05-04 — End: 2023-05-13

## 2023-05-04 MED ORDER — BARIUM SULFATE 2 % PO SUSP
450.0000 mL | ORAL | Status: AC
  Administered 2023-05-04 (×2): 450 mL via ORAL

## 2023-05-04 MED ORDER — VITAMIN B-12 1000 MCG PO TABS
1000.0000 ug | ORAL_TABLET | Freq: Every day | ORAL | Status: DC
  Administered 2023-05-04 – 2023-05-12 (×9): 1000 ug via ORAL
  Filled 2023-05-04 (×9): qty 1

## 2023-05-04 MED ORDER — CHLORHEXIDINE GLUCONATE CLOTH 2 % EX PADS
6.0000 | MEDICATED_PAD | Freq: Every day | CUTANEOUS | Status: DC
  Administered 2023-05-04 – 2023-05-10 (×5): 6 via TOPICAL

## 2023-05-04 MED ORDER — FUROSEMIDE 10 MG/ML IJ SOLN: 60.0000 mg | Freq: Every day | INTRAMUSCULAR | Status: DC

## 2023-05-04 NOTE — ED Notes (Signed)
Pt was assisted back to bed, pt was also given breakfast tray, pt now eating breakfast

## 2023-05-04 NOTE — Progress Notes (Addendum)
PROGRESS NOTE    Kimberly [name]  WJX:[id number] DOB: Jul 14, 1926 DOA: 05/03/2023 PCP: Rebekah Chesterfield, NP   Brief Narrative: 89 year old with past medical history significant for CHF, A-fib, bipolar, hypertension presented to the emergency department with dizziness, shortness of breath, leg swelling.  She has been weeping from her bilateral lower extremity, reports weight gain.  Due to worsening discomfort in her legs she presents to the ED.  She was found to have a sodium of 127, creatinine 1.2, hemoglobin 8.1, BNP 598.  Chest x-ray showed cardiomegaly with increased vascular prominence concerning for volume overload.  Patient received IV Lasix in the ED. admitted for further evaluation   Assessment & Plan:   Principal Problem:   CHF (congestive heart failure) (HCC)   1-Acute on Chronic Diastolic Heart Failure Exacerbation;  -Present with dyspnea, weight gain, lower extremity edema, chest x-ray with increased vascular prominence.  BNP elevated at 598. -Continue with IV lasix 60 mg IV daily.  -Daily weight -Strict I and O.  -ECHO ordered.   2-Anemia,  occult blood positive Presents with anemia, occult blood positive.  Held Eliquis. Will discussed with GI and cardiology.  GI was consulted.  Check anemia panel.  GI recommend CT abdomen pelvis.   3-CKD 3B Monitor Cr, stable.    Hypertension: -Continue with Coreg.   Insomnia: On Remeron.   Paroxysmal A-fib: Hold eliquis. Continue with coreg.   GERD: PPI  Depression: Continue with Zoloft.   Hyponatremia:  Check TSH, Cortisol.  Suspect Hypervolemia form HF>   Hypothermia. Check TSH.  No Leukocytosis, x ray finding consistent with HF.  UA negative for infection.   Chronic LE edema and  redness, venous stasis.  Will add Ancef to cover for cellulitis due to hypothermia.  She has been on Eliquis   Estimated body mass index is 19.58 kg/m as calculated from the following:   Height as of this encounter: 5\' 7"  (1.702  m).   Weight as of this encounter: 56.7 kg.   DVT prophylaxis: SCD Code Status: discussed with patient, wishes to be DNR> discussed with Daughter  Family Communication: Daughter updated Disposition Plan:  Status is: Inpatient Remains inpatient appropriate because: management of HF    Consultants:  Cardiology   Procedures:  ECHO  Antimicrobials:    Subjective: She is alert, conversant. Answer questions appropriately.  Report dyspnea. She is not cold. Does not want to be cover.   Objective: Vitals:   05/03/23 2100 05/03/23 2351 05/04/23 0415 05/04/23 0715  BP: 138/60  137/60 (!) 123/54  Pulse: (!) 58 69 65 61  Resp: 18 (!) 21 (!) 23 14  Temp:  (!) 95.3 F (35.2 C) (!) 94.5 F (34.7 C)   TempSrc:  Rectal Rectal   SpO2: 94% 94% 93% (!) 84%  Weight:      Height:       No intake or output data in the 24 hours ending 05/04/23 0739 Filed Weights   05/03/23 1753  Weight: 56.7 kg    Examination:  General exam: Appears calm and comfortable  Respiratory system: BL Crackles. Respiratory effort normal. Cardiovascular system: S1 & S2 heard, RRR. Gastrointestinal system: Abdomen is nondistended, soft and nontender. No organomegaly or masses felt. Normal bowel sounds heard. Central nervous system: Alert and oriented. Extremities: BL LE edema, erythema   Data Reviewed: I have personally reviewed following labs and imaging studies  CBC: Recent Labs  Lab 05/03/23 1850 05/04/23 0705  WBC 5.7 4.9  HGB 8.1* 7.9*  HCT  25.6* 24.3*  MCV 83.9 82.7  PLT 202 177   Basic Metabolic Panel: Recent Labs  Lab 05/03/23 1850 05/04/23 0705  NA 127* 127*  K 4.8 4.0  CL 93* 90*  CO2 25 28  GLUCOSE 93 103*  BUN 40* 42*  CREATININE 1.22* 1.29*  CALCIUM 8.7* 8.6*   GFR: Estimated Creatinine Clearance: 23.9 mL/min (A) (by C-G formula based on SCr of 1.29 mg/dL (H)). Liver Function Tests: No results for input(s): "AST", "ALT", "ALKPHOS", "BILITOT", "PROT", "ALBUMIN" in the  last 168 hours. No results for input(s): "LIPASE", "AMYLASE" in the last 168 hours. No results for input(s): "AMMONIA" in the last 168 hours. Coagulation Profile: No results for input(s): "INR", "PROTIME" in the last 168 hours. Cardiac Enzymes: No results for input(s): "CKTOTAL", "CKMB", "CKMBINDEX", "TROPONINI" in the last 168 hours. BNP (last 3 results) No results for input(s): "PROBNP" in the last 8760 hours. HbA1C: No results for input(s): "HGBA1C" in the last 72 hours. CBG: Recent Labs  Lab 05/03/23 1905  GLUCAP 94   Lipid Profile: No results for input(s): "CHOL", "HDL", "LDLCALC", "TRIG", "CHOLHDL", "LDLDIRECT" in the last 72 hours. Thyroid Function Tests: No results for input(s): "TSH", "T4TOTAL", "FREET4", "T3FREE", "THYROIDAB" in the last 72 hours. Anemia Panel: No results for input(s): "VITAMINB12", "FOLATE", "FERRITIN", "TIBC", "IRON", "RETICCTPCT" in the last 72 hours. Sepsis Labs: No results for input(s): "PROCALCITON", "LATICACIDVEN" in the last 168 hours.  Recent Results (from the past 240 hours)  Resp panel by RT-PCR (RSV, Flu A&B, Covid) Anterior Nasal Swab     Status: None   Collection Time: 05/03/23  6:41 PM   Specimen: Anterior Nasal Swab  Result Value Ref Range Status   SARS Coronavirus 2 by RT PCR NEGATIVE NEGATIVE Final    Comment: (NOTE) SARS-CoV-2 target nucleic acids are NOT DETECTED.  The SARS-CoV-2 RNA is generally detectable in upper respiratory specimens during the acute phase of infection. The lowest concentration of SARS-CoV-2 viral copies this assay can detect is 138 copies/mL. A negative result does not preclude SARS-Cov-2 infection and should not be used as the sole basis for treatment or other patient management decisions. A negative result may occur with  improper specimen collection/handling, submission of specimen other than nasopharyngeal swab, presence of viral mutation(s) within the areas targeted by this assay, and inadequate  number of viral copies(<138 copies/mL). A negative result must be combined with clinical observations, patient history, and epidemiological information. The expected result is Negative.  Fact Sheet for Patients:  BloggerCourse.com  Fact Sheet for Healthcare Providers:  SeriousBroker.it  This test is no t yet approved or cleared by the [place] FDA and  has been authorized for detection and/or diagnosis of SARS-CoV-2 by FDA under an Emergency Use Authorization (EUA). This EUA will remain  in effect (meaning this test can be used) for the duration of the COVID-19 declaration under Section 564(b)(1) of the Act, 21 U.S.C.section 360bbb-3(b)(1), unless the authorization is terminated  or revoked sooner.       Influenza A by PCR NEGATIVE NEGATIVE Final   Influenza B by PCR NEGATIVE NEGATIVE Final    Comment: (NOTE) The Xpert Xpress SARS-CoV-2/FLU/RSV plus assay is intended as an aid in the diagnosis of influenza from Nasopharyngeal swab specimens and should not be used as a sole basis for treatment. Nasal washings and aspirates are unacceptable for Xpert Xpress SARS-CoV-2/FLU/RSV testing.  Fact Sheet for Patients: BloggerCourse.com  Fact Sheet for Healthcare Providers: SeriousBroker.it  This test is not yet approved or cleared  by the [place] and has been authorized for detection and/or diagnosis of SARS-CoV-2 by FDA under an Emergency Use Authorization (EUA). This EUA will remain in effect (meaning this test can be used) for the duration of the COVID-19 declaration under Section 564(b)(1) of the Act, 21 U.S.C. section 360bbb-3(b)(1), unless the authorization is terminated or revoked.     Resp Syncytial Virus by PCR NEGATIVE NEGATIVE Final    Comment: (NOTE) Fact Sheet for Patients: BloggerCourse.com  Fact Sheet for Healthcare  Providers: SeriousBroker.it  This test is not yet approved or cleared by the [place] FDA and has been authorized for detection and/or diagnosis of SARS-CoV-2 by FDA under an Emergency Use Authorization (EUA). This EUA will remain in effect (meaning this test can be used) for the duration of the COVID-19 declaration under Section 564(b)(1) of the Act, 21 U.S.C. section 360bbb-3(b)(1), unless the authorization is terminated or revoked.  Performed at [facility], [address], [place], [place] [address]          Radiology Studies: [facility] Chest Port 1 View Result Date: 05/03/2023 CLINICAL DATA:  Dizziness and shortness of breath. Weeping edema both lower extremities. EXAM: PORTABLE CHEST 1 VIEW COMPARISON:  Portable chest 01/05/2023, 01/01/2023, 12/23/2022 FINDINGS: Left chest dual lead pacemaker, wiring and additional AID wire are unaltered. The heart is enlarged. There is increased central vascular prominence and flow cephalization, interval development of mild basilar and central interstitial edema, small pleural effusions. No focal airspace disease is seen. Bilateral diaphragmatic eventrations are again noted. The aorta is tortuous and calcified with stable mediastinum. Chronic biapical pleural-parenchymal scarring and calcifications are again shown. No new osseous findings. Osteopenia and degenerative change of the spine. IMPRESSION: 1. Cardiomegaly with increased central vascular prominence and flow cephalization, interval development of mild basilar and central interstitial edema, small pleural effusions. Findings are consistent with CHF. 2. Aortic atherosclerosis with heavy calcification, uncoiling. Electronically Signed   By: Almira Bar M.D.   On: 05/03/2023 22:05        Scheduled Meds:  apixaban  2.5 mg Oral BID   carvedilol  6.25 mg Oral BID WC   mirtazapine  15 mg Oral QHS   pantoprazole  40 mg Oral Daily   sertraline  50  mg Oral Daily   traZODone  50 mg Oral QHS   Continuous Infusions:   LOS: 1 day    Time spent: 35 minutes     Egidio Lofgren A Jullian Clayson, MD [facility]   If 7PM-7AM, please contact night-coverage [email or web address]  05/04/2023, 7:39 AM

## 2023-05-04 NOTE — Consult Note (Signed)
[facility] Gastroenterology Consult  Referring Provider: No ref. provider found Primary Care Physician:  Rebekah Chesterfield, NP Primary Gastroenterologist: Gentry Fitz  Reason for Consultation: anemia, fecal occult positive stool  SUBJECTIVE:   HPI: Kimberly [name] is a 89 y.o. female with past medical history significant for congestive heart failure, atrial fibrillation on Eliquis, diverticulitis in 2015. Presented to hospital on 05/03/23 with chief complaint of shortness of breath and lower extremity edema.  On my evaluation, she noted that her lower extremity edema is improving. She has chronic shortness of breath. She last took Eliquis before arriving to the hospital. She believes that she has hemorrhoids as she sees intermittent blood per rectum. She has been experiencing some right sided abdominal discomfort. She denied nausea and vomiting. She has seen some dark colored stool, she is on iron supplementation.   CT imaging 12/10/22 showed small hiatal hernia, status post cholecystectomy, diffuse diverticulosis, stranding near the splenic flexure questionable for early diverticulitis.   EGD 09/21/21 Dr. [name] Belton for melena - findings of possible short segment Barrett's esophagus, LA grade B esophagitis, 5 cm hiatal hernia, stomach and duodenum with normal appearance.   COL 03/03/2017 Dr. Darrick Penna for hematochezia - findings of sessile polyps x 5 (tubular adenoma, sessile serrated adenoma and hyperplastic polyp), rectosigmoid and sigmoid colon diverticulosis, small internal hemorrhoids.   Past Medical History:  Diagnosis Date   Acute blood loss anemia 10/11/2012   Acute diverticulitis 08/24/2013   Acute on chronic combined systolic and diastolic CHF, NYHA class 4 (HCC) 11/15/2013   Antral ulcer 10/11/2012   Aortic atherosclerosis (HCC)    Atrial fibrillation (HCC)    Bipolar affective disorder (HCC) 03/23/2017   Cardiomyopathy, nonischemic (HCC)    Chronic anticoagulation 10/12/2012   CKD (chronic  kidney disease) stage 3, GFR 30-59 ml/min (HCC) 10/12/2012   Depression    Diverticulosis    DVT (deep venous thrombosis) (HCC)    left peroneal veins/IVC Filter placed as per hospital discharge 10/24/21   Erosive esophagitis 10/11/2012   Esophagitis    Fibromyalgia    Glaucoma    H/O echocardiogram 2007   EF 40-45%,          Hiatal hernia    History of anemia due to CKD    Hypertension    Internal hemorrhoids    Nephrolithiasis    Osteoarthritis    Pacemaker    Last saw cards 07/2013   Prepyloric ulcer    Scoliosis    Tubular adenoma of colon    Past Surgical History:  Procedure Laterality Date   ABDOMINAL HYSTERECTOMY     APPENDECTOMY     BACK SURGERY     BIOPSY  03/03/2017   Procedure: BIOPSY;  Surgeon: West Bali, MD;  Location: [facility];  Service: Endoscopy;;  gastric   BREAST SURGERY     CARDIAC CATHETERIZATION  12/08/2005   LAD AND LEFT MAIN WITH NO HIGH-GRADE STENOSIS. MILD DISEASE IN THE CX AND LAD SYSTEM. SEVERE LV DYSFUNCTION WITH DILATION OF THE LV. EF 15-20%. LV END-DIASTOLIC PRESSURE IS 90. +1 MR.   CHOLECYSTECTOMY     COLONOSCOPY N/A 03/03/2017   Dr. Darrick Penna: 5 colon polyps removed, adenomatous.  Diverticulosis.  99% of the colon was cleared but the cecum was not adequately seen.   CYSTOSCOPY N/A 02/24/2013   Procedure: CYSTOSCOPY WITH URETHRAL DILITATION;  Surgeon: Ky Barban, MD;  Location: [facility] ORS;  Service: Urology;  Laterality: N/A;   DOPPLER ECHOCARDIOGRAPHY N/A 05/30/2010   LV SIZE IS  NORMAL. LV SYSTOLIC FUNCTION IS LOW NORMAL. EF=50-55%. MILD INFERIOR HYPOKINESIS.MILD TO MODERATE POSTERIOR WALL HYPOKINESIS.PACEMAKER LEAD IN THE RV. LA IS MILDLY DILATED. RA IS MODERATE TO SEVERLY DILATED. PACEMAKER LEAD IN THE RA. MILD CALCICICATION OF THE MV APPARATUS. MODERATE MR. MILD TO MODERATE TR. MILD PHTN.AV MILDLY SCLEROTIC.   ESOPHAGOGASTRODUODENOSCOPY N/A 10/13/2012   Dr. Jena Gauss: severe ulcerative reflux esophagitis, question of Barrett's but negative  path, single deep prepyloric antral ulcer, negative H.pylori   ESOPHAGOGASTRODUODENOSCOPY N/A 03/03/2017   Dr. Darrick Penna: Gastritis/duodenitis, no H. pylori   ESOPHAGOGASTRODUODENOSCOPY (EGD) WITH PROPOFOL N/A 10/01/2021   Procedure: ESOPHAGOGASTRODUODENOSCOPY (EGD) WITH PROPOFOL;  Surgeon: Beverley Fiedler, MD;  Location: [facility] ENDOSCOPY;  Service: Gastroenterology;  Laterality: N/A;   HERNIA REPAIR     right inguinal hernia and umbilical   LOWETR EXT VENOUS Bilateral 11-08-10   R & L- NO EVIDENCE OF THROMBUS OR THROMBOPHLEBITIS. THERE IS MILD AMOUNT OF SUBCUTANEOUS EDEMA NOTED WITHIN THE LEFT CALF AND ANKLE. R & L GSV AND SSV- NO VENOUS INSUFF NOTED.   NECK SURGERY     NUCLEAR STRESS TEST N/A 02/13/2009   NORMAL PATTERN OF PERFUSION IN ALL REGIONS. POST STRESS VENTICULAR SIZE IS NORMAL. POST  STESS EF 85%.  NORMAL MYOCARDIAL PERFUSION STUDY.   OPEN REDUCTION INTERNAL FIXATION (ORIF) DISTAL PHALANX Left 11/16/2018   Procedure: MIDDLE FINGER OPEN REDUCTION VERSUS RECONSTRUCTION;  Surgeon: Dominica Severin, MD;  Location: [facility] OR;  Service: Orthopedics;  Laterality: Left;   PACEMAKER INSERTION     POLYPECTOMY  03/03/2017   Procedure: POLYPECTOMY;  Surgeon: West Bali, MD;  Location: [facility];  Service: Endoscopy;;  colon   PPM GENERATOR CHANGEOUT N/A 06/18/2020   Procedure: PPM GENERATOR CHANGEOUT;  Surgeon: Thurmon Fair, MD;  Location: [facility] INVASIVE CV LAB;  Service: Cardiovascular;  Laterality: N/A;   TONSILLECTOMY     YAG LASER APPLICATION Bilateral 11/20/2014   Procedure: YAG LASER APPLICATION;  Surgeon: Susa Simmonds, MD;  Location: [facility] ORS;  Service: Ophthalmology;  Laterality: Bilateral;   Prior to Admission medications   Medication Sig Start Date End Date Taking? Authorizing Provider  apixaban (ELIQUIS) 2.5 MG TABS tablet Take 1 tablet (2.5 mg total) by mouth 2 (two) times daily. 09/01/22  Yes Croitoru, Mihai, MD  BLINK TEARS 0.25 % SOLN Place 1 drop into both eyes 3 (three) times daily.    Yes [provider]  calcium carbonate (TUMS - DOSED IN MG ELEMENTAL CALCIUM) 500 MG chewable tablet Chew 1 tablet by mouth every 8 (eight) hours as needed for indigestion or heartburn.   Yes [provider]  carvedilol (COREG) 6.25 MG tablet TAKE 1 TABLET BY MOUTH TWICE  DAILY WITH MEALS 12/19/22  Yes Croitoru, Mihai, MD  dicyclomine (BENTYL) 20 MG tablet Take 20 mg by mouth every 8 (eight) hours as needed (for gas).   Yes [provider]  ELIQUIS 2.5 MG TABS tablet TAKE 1 TABLET BY MOUTH TWICE  DAILY 12/29/22  Yes Croitoru, Mihai, MD  furosemide (LASIX) 20 MG tablet Take 1 tablet (20 mg total) by mouth daily as needed for edema (for worsening leg swelling or shortness of breath). Patient taking differently: Take 20 mg by mouth 2 (two) times daily. 10/24/21  Yes Arrien, York Ram, MD  meloxicam (MOBIC) 7.5 MG tablet Take 7.5 mg by mouth every 12 (twelve) hours as needed (or osteoarthritis).   Yes [provider]  mirtazapine (REMERON) 15 MG tablet Take 15 mg by mouth at bedtime.   Yes [provider]  Multiple Vitamins-Minerals (OCUVITE ADULT FORMULA) CAPS Take 1 capsule by mouth in the morning.   Yes [provider]  Multiple Vitamins-Minerals (PRESERVISION AREDS 2) CAPS Take 1 capsule by mouth every 12 (twelve) hours.   Yes [provider]  omeprazole (PRILOSEC) 20 MG capsule Take 1 capsule (20 mg total) by mouth daily. Patient taking differently: Take 20 mg by mouth daily before breakfast. 12/30/22  Yes Croitoru, Mihai, MD  sertraline (ZOLOFT) 50 MG tablet Take 50 mg by mouth in the morning.   Yes [provider]  SYSTANE 0.4-0.3 % GEL ophthalmic gel Place 1 Application into both eyes every 8 (eight) hours as needed (for dryness or irritation).   Yes [provider]  traMADol (ULTRAM) 50 MG tablet Take 50 mg by mouth every 12 (twelve) hours as needed for moderate pain (pain score 4-6) or severe pain (pain score  7-10).   Yes [provider]  dicyclomine (BENTYL) 10 MG capsule Take 1 capsule (10 mg total) by mouth 3 (three) times daily before meals. Patient not taking: Reported on 05/03/2023 05/24/21   Triplett, Tammy, PA-C  potassium chloride (KLOR-CON M) 10 MEQ tablet Take 1 tablet (10 mEq total) by mouth daily as needed (take with furosemide). Patient not taking: Reported on 05/03/2023 10/24/21   Arrien, York Ram, MD  tiZANidine (ZANAFLEX) 4 MG tablet Take 1 tablet (4 mg total) by mouth every 6 (six) hours as needed (for headaches). Patient not taking: Reported on 05/03/2023 10/03/21   Alberteen Sam, MD  zolpidem (AMBIEN) 5 MG tablet Take 1 tablet (5 mg total) by mouth at bedtime. Patient not taking: Reported on 05/03/2023 10/03/21   Alberteen Sam, MD   Current Facility-Administered Medications  Medication Dose Route Frequency Provider Last Rate Last Admin   acetaminophen (TYLENOL) tablet 650 mg  650 mg Oral Q6H PRN Alan Mulder, MD   650 mg at 05/04/23 0130   Or   acetaminophen (TYLENOL) suppository 650 mg  650 mg Rectal Q6H PRN Alan Mulder, MD       carvedilol (COREG) tablet 6.25 mg  6.25 mg Oral BID WC Alan Mulder, MD       Melene Muller ON 05/05/2023] furosemide (LASIX) injection 60 mg  60 mg Intravenous Daily Regalado, Belkys A, MD       mirtazapine (REMERON) tablet 15 mg  15 mg Oral QHS Alan Mulder, MD   15 mg at 05/04/23 0127   ondansetron (ZOFRAN) tablet 4 mg  4 mg Oral Q6H PRN Alan Mulder, MD       Or   ondansetron (ZOFRAN) injection 4 mg  4 mg Intravenous Q6H PRN Dorrell, Robert, MD       oxyCODONE (Oxy IR/ROXICODONE) immediate release tablet 5 mg  5 mg Oral Q4H PRN Dorrell, Robert, MD       pantoprazole (PROTONIX) EC tablet 40 mg  40 mg Oral Daily Dorrell, Robert, MD   40 mg at 05/04/23 1042   sertraline (ZOLOFT) tablet 50 mg  50 mg Oral Daily Dorrell, Molly Maduro, MD   50 mg at 05/04/23 1042   traZODone (DESYREL) tablet 50 mg  50 mg Oral Florence Canner, MD   50 mg at 05/04/23 0128   Current Outpatient Medications  Medication Sig Dispense Refill   apixaban (ELIQUIS) 2.5 MG TABS tablet Take 1 tablet (2.5 mg total) by mouth 2 (two) times daily. 28 tablet 0   BLINK TEARS 0.25 % SOLN Place 1 drop into both eyes 3 (  three) times daily.     calcium carbonate (TUMS - DOSED IN MG ELEMENTAL CALCIUM) 500 MG chewable tablet Chew 1 tablet by mouth every 8 (eight) hours as needed for indigestion or heartburn.     carvedilol (COREG) 6.25 MG tablet TAKE 1 TABLET BY MOUTH TWICE  DAILY WITH MEALS 200 tablet 2   dicyclomine (BENTYL) 20 MG tablet Take 20 mg by mouth every 8 (eight) hours as needed (for gas).     ELIQUIS 2.5 MG TABS tablet TAKE 1 TABLET BY MOUTH TWICE  DAILY 160 tablet 3   furosemide (LASIX) 20 MG tablet Take 1 tablet (20 mg total) by mouth daily as needed for edema (for worsening leg swelling or shortness of breath). (Patient taking differently: Take 20 mg by mouth 2 (two) times daily.) 30 tablet 0   meloxicam (MOBIC) 7.5 MG tablet Take 7.5 mg by mouth every 12 (twelve) hours as needed (or osteoarthritis).     mirtazapine (REMERON) 15 MG tablet Take 15 mg by mouth at bedtime.     Multiple Vitamins-Minerals (OCUVITE ADULT FORMULA) CAPS Take 1 capsule by mouth in the morning.     Multiple Vitamins-Minerals (PRESERVISION AREDS 2) CAPS Take 1 capsule by mouth every 12 (twelve) hours.     omeprazole (PRILOSEC) 20 MG capsule Take 1 capsule (20 mg total) by mouth daily. (Patient taking differently: Take 20 mg by mouth daily before breakfast.) 90 capsule 3   sertraline (ZOLOFT) 50 MG tablet Take 50 mg by mouth in the morning.     SYSTANE 0.4-0.3 % GEL ophthalmic gel Place 1 Application into both eyes every 8 (eight) hours as needed (for dryness or irritation).     traMADol (ULTRAM) 50 MG tablet Take 50 mg by mouth every 12 (twelve) hours as needed for moderate pain (pain score 4-6) or severe pain (pain score 7-10).     dicyclomine (BENTYL) 10 MG  capsule Take 1 capsule (10 mg total) by mouth 3 (three) times daily before meals. (Patient not taking: Reported on 05/03/2023) 21 capsule 0   potassium chloride (KLOR-CON M) 10 MEQ tablet Take 1 tablet (10 mEq total) by mouth daily as needed (take with furosemide). (Patient not taking: Reported on 05/03/2023) 30 tablet 0   tiZANidine (ZANAFLEX) 4 MG tablet Take 1 tablet (4 mg total) by mouth every 6 (six) hours as needed (for headaches). (Patient not taking: Reported on 05/03/2023) 30 tablet 0   zolpidem (AMBIEN) 5 MG tablet Take 1 tablet (5 mg total) by mouth at bedtime. (Patient not taking: Reported on 05/03/2023) 12 tablet 0   Allergies as of 05/03/2023 - Review Complete 05/03/2023  Allergen Reaction Noted   Penicillins Hives 01/22/2011   Ceftriaxone Itching 01/08/2023   Camphor Other (See Comments) 05/03/2023   Ciprofloxacin Diarrhea and Other (See Comments) 04/10/2014   Flagyl [metronidazole] Other (See Comments) 03/21/2017   Papaya derivatives Hives 01/22/2011   Iodine Rash and Other (See Comments) 01/22/2011   Sulfa antibiotics Rash 01/22/2011   Family History  Problem Relation Age of Onset   Cancer Sister    Asthma Sister    Heart failure Brother    Pulmonary embolism Brother    Cancer Brother    Colon cancer Neg Hx    Social History   Socioeconomic History   Marital status: Widowed    Spouse name: Not on file   Number of children: Not on file   Years of education: Not on file   Highest education level: Some college, no degree  Occupational History   Occupation: Microbiologist: RETIRED    Comment: retired  Tobacco Use   Smoking status: Former    Current packs/day: 0.00    Average packs/day: 0.5 packs/day for 30.0 years (15.0 ttl pk-yrs)    Types: Cigarettes    Start date: 12/14/1974    Quit date: 12/13/2004    Years since quitting: 18.4   Smokeless tobacco: Never   Tobacco comments:    former smoker  Vaping Use   Vaping status: Never Used   Substance and Sexual Activity   Alcohol use: Not Currently    Comment: history of drinking a 1/2 glass of wine in the evening   Drug use: No   Sexual activity: Never  Other Topics Concern   Not on file  Social History Narrative   No home exercise program. PT ordered this week.   Social Drivers of Corporate investment banker Strain: Low Risk  (01/06/2023)   Received from [facility]   Overall Financial Resource Strain (CARDIA)    Difficulty of Paying Living Expenses: Not very hard  Food Insecurity: No Food Insecurity (01/06/2023)   [facility]   Hunger Vital Sign    Worried About Running Out of Food in the Last Year: Never true    Ran Out of Food in the Last Year: Never true  Transportation Needs: Unmet Transportation Needs (01/06/2023)   Received [facility]   PRAPARE - Transportation    Lack of Transportation (Medical): Yes    Lack of Transportation (Non-Medical): Not on file  Physical Activity: Inactive (08/15/2019)   Exercise Vital Sign    Days of Exercise per Week: 0 days    Minutes of Exercise per Session: 0 min  Stress: No Stress Concern Present (10/22/2018)   [employer] of Occupational Health - Occupational Stress Questionnaire    Feeling of Stress : Only a little  Social Connections: Moderately Isolated (01/06/2023)   Received from [facility]   Social Connection and Isolation Panel [NHANES]    Frequency of Communication with Friends and Family: Once a week    Frequency of Social Gatherings with Friends and Family: Once a week    Attends Religious Services: 1 to 4 times per year    Active Member of [employer] or Organizations: No    Attends Banker Meetings: 1 to 4 times per year    Marital Status: Widowed  Intimate Partner Violence: Not At Risk (10/22/2018)   Humiliation, Afraid, Rape, and Kick questionnaire    Fear of Current or Ex-Partner: No    Emotionally Abused: No    Physically Abused: No    Sexually Abused: No    Review of Systems:  Review of Systems  Respiratory:  Positive for shortness of breath.   Cardiovascular:  Negative for chest pain.  Gastrointestinal:  Positive for abdominal pain. Negative for blood in stool, nausea and vomiting.    OBJECTIVE:   Temp:  [93.9 F (34.4 C)-95.3 F (35.2 C)] 94.7 F (34.8 C) (12/30 1122) Pulse Rate:  [58-69] 62 (12/30 0930) Resp:  [13-23] 21 (12/30 0930) BP: (100-154)/(43-76) 128/67 (12/30 0930) SpO2:  [90 %-99 %] 90 % (12/30 0930) Weight:  [56.7 kg] 56.7 kg (12/29 1753)   Physical Exam Constitutional:      General: She is not in acute distress.    Appearance: She is not ill-appearing, toxic-appearing or diaphoretic.  Cardiovascular:     Rate and Rhythm:  Normal rate. Rhythm irregular.  Pulmonary:     Effort: No respiratory distress.     Breath sounds: Normal breath sounds.  Abdominal:     General: Bowel sounds are normal. There is no distension.     Palpations: Abdomen is soft.     Tenderness: There is no abdominal tenderness. There is no guarding.  Skin:    General: Skin is warm and dry.  Neurological:     Mental Status: She is alert.     Labs: Recent Labs    05/03/23 1850 05/04/23 0705  WBC 5.7 4.9  HGB 8.1* 7.9*  HCT 25.6* 24.3*  PLT 202 177   BMET Recent Labs    05/03/23 1850 05/04/23 0705  NA 127* 127*  K 4.8 4.0  CL 93* 90*  CO2 25 28  GLUCOSE 93 103*  BUN 40* 42*  CREATININE 1.22* 1.29*  CALCIUM 8.7* 8.6*   LFT No results for input(s): "PROT", "ALBUMIN", "AST", "ALT", "ALKPHOS", "BILITOT", "BILIDIR", "IBILI" in the last 72 hours. PT/INR No results for input(s): "LABPROT", "INR" in the last 72 hours.  Diagnostic imaging: DG Chest Port 1 View Result Date: 05/03/2023 CLINICAL DATA:  Dizziness and shortness of breath. Weeping edema both lower extremities. EXAM: PORTABLE CHEST 1 VIEW COMPARISON:  Portable chest 01/05/2023, 01/01/2023, 12/23/2022 FINDINGS: Left chest dual lead pacemaker, wiring and additional AID  wire are unaltered. The heart is enlarged. There is increased central vascular prominence and flow cephalization, interval development of mild basilar and central interstitial edema, small pleural effusions. No focal airspace disease is seen. Bilateral diaphragmatic eventrations are again noted. The aorta is tortuous and calcified with stable mediastinum. Chronic biapical pleural-parenchymal scarring and calcifications are again shown. No new osseous findings. Osteopenia and degenerative change of the spine. IMPRESSION: 1. Cardiomegaly with increased central vascular prominence and flow cephalization, interval development of mild basilar and central interstitial edema, small pleural effusions. Findings are consistent with CHF. 2. Aortic atherosclerosis with heavy calcification, uncoiling. Electronically Signed   By: Almira Bar M.D.   On: 05/03/2023 22:05   IMPRESSION: Normocytic anemia Fecal occult positive stool Personal history colon polyps Diverticulosis, rectosigmoid, sigmoid Congestive heart failure exacerbation Atrial fibrillation, on Eliquis prior to admission  Chronic kidney disease History diverticulitis   PLAN: -Continue treatment for congestive heart failure exacerbation -Check CT imaging of abdomen and pelvis to evaluate anemia further -Trend H/H, transfuse for Hgb < 7  -Check iron studies -Can consider colonoscopy to investigate anemia further, would be higher risk prep and procedure given patient age and co morbidities, though not unacceptable risk -Ok for diet today, [facility] will follow   LOS: 1 day   Liliane Shi, DO [facility]

## 2023-05-04 NOTE — Consult Note (Addendum)
Cardiology Consultation   Patient ID: LAKYA SIMENTAL MRN: [id number]; DOB: 05-03-1927  Admit date: 05/03/2023 Date of Consult: 05/04/2023  PCP:  Rebekah Chesterfield, NP   [facility] Providers Cardiologist:  Thurmon Fair, MD   Patient Profile:   PHENICIA BENNETTE is a 89 y.o. female with a hx of permanent atrial fibrillation with AV node ablation and secondary complete heart block s/p PPM in place with GEN change 06/2020, diastolic heart failure with preserved EF by echo in 2021 but history of nonischemic cardiomyopathy with improved EF following cardiac resynchronization therapy who is being seen 05/04/2023 for the evaluation of CHF at the request of Dr. Sunnie Nielsen.  History of Present Illness:   Ms. Eto follows with Dr. Royann Shivers.  She is anticoagulated with Eliquis.  She suffered a fall in February 2024 and again a few days later.  She has frequent ER visits and phone calls to our office.  She underwent generator change in 2022 with a BiV pacemaker CRT-P.  She has greater than 99% biventricular pacing.  She has continued preserved LVEF on echocardiograms in 2016 and 2021.   She was last seen in clinic with Dr. Royann Shivers on 09/01/22 and was doing well at that time. There was a question of eliquis compliance due to inadvertent discontinuation at discharge after a fall. She reports compliance on eliquis.   She presented to [facility] with worsening lower extremity edema and pain. She also had a positive heme occult card.    Past Medical History:  Diagnosis Date   Acute blood loss anemia 10/11/2012   Acute diverticulitis 08/24/2013   Acute on chronic combined systolic and diastolic CHF, NYHA class 4 (HCC) 11/15/2013   Antral ulcer 10/11/2012   Aortic atherosclerosis (HCC)    Atrial fibrillation (HCC)    Bipolar affective disorder (HCC) 03/23/2017   Cardiomyopathy, nonischemic (HCC)    Chronic anticoagulation 10/12/2012   CKD (chronic kidney disease) stage 3, GFR 30-59 ml/min (HCC)  10/12/2012   Depression    Diverticulosis    DVT (deep venous thrombosis) (HCC)    left peroneal veins/IVC Filter placed as per hospital discharge 10/24/21   Erosive esophagitis 10/11/2012   Esophagitis    Fibromyalgia    Glaucoma    H/O echocardiogram 2007   EF 40-45%,          Hiatal hernia    History of anemia due to CKD    Hypertension    Internal hemorrhoids    Nephrolithiasis    Osteoarthritis    Pacemaker    Last saw cards 07/2013   Prepyloric ulcer    Scoliosis    Tubular adenoma of colon     Past Surgical History:  Procedure Laterality Date   ABDOMINAL HYSTERECTOMY     APPENDECTOMY     BACK SURGERY     BIOPSY  03/03/2017   Procedure: BIOPSY;  Surgeon: West Bali, MD;  Location: [facility];  Service: Endoscopy;;  gastric   BREAST SURGERY     CARDIAC CATHETERIZATION  12/08/2005   LAD AND LEFT MAIN WITH NO HIGH-GRADE STENOSIS. MILD DISEASE IN THE CX AND LAD SYSTEM. SEVERE LV DYSFUNCTION WITH DILATION OF THE LV. EF 15-20%. LV END-DIASTOLIC PRESSURE IS 90. +1 MR.   CHOLECYSTECTOMY     COLONOSCOPY N/A 03/03/2017   Dr. Darrick Penna: 5 colon polyps removed, adenomatous.  Diverticulosis.  99% of the colon was cleared but the cecum was not adequately seen.   CYSTOSCOPY N/A 02/24/2013   Procedure:  CYSTOSCOPY WITH URETHRAL DILITATION;  Surgeon: Ky Barban, MD;  Location: [facility] ORS;  Service: Urology;  Laterality: N/A;   DOPPLER ECHOCARDIOGRAPHY N/A 05/30/2010   LV SIZE IS NORMAL. LV SYSTOLIC FUNCTION IS LOW NORMAL. EF=50-55%. MILD INFERIOR HYPOKINESIS.MILD TO MODERATE POSTERIOR WALL HYPOKINESIS.PACEMAKER LEAD IN THE RV. LA IS MILDLY DILATED. RA IS MODERATE TO SEVERLY DILATED. PACEMAKER LEAD IN THE RA. MILD CALCICICATION OF THE MV APPARATUS. MODERATE MR. MILD TO MODERATE TR. MILD PHTN.AV MILDLY SCLEROTIC.   ESOPHAGOGASTRODUODENOSCOPY N/A 10/13/2012   Dr. Jena Gauss: severe ulcerative reflux esophagitis, question of Barrett's but negative path, single deep prepyloric antral ulcer,  negative H.pylori   ESOPHAGOGASTRODUODENOSCOPY N/A 03/03/2017   Dr. Darrick Penna: Gastritis/duodenitis, no H. pylori   ESOPHAGOGASTRODUODENOSCOPY (EGD) WITH PROPOFOL N/A 10/01/2021   Procedure: ESOPHAGOGASTRODUODENOSCOPY (EGD) WITH PROPOFOL;  Surgeon: Beverley Fiedler, MD;  Location: [facility] ENDOSCOPY;  Service: Gastroenterology;  Laterality: N/A;   HERNIA REPAIR     right inguinal hernia and umbilical   LOWETR EXT VENOUS Bilateral 11-08-10   R & L- NO EVIDENCE OF THROMBUS OR THROMBOPHLEBITIS. THERE IS MILD AMOUNT OF SUBCUTANEOUS EDEMA NOTED WITHIN THE LEFT CALF AND ANKLE. R & L GSV AND SSV- NO VENOUS INSUFF NOTED.   NECK SURGERY     NUCLEAR STRESS TEST N/A 02/13/2009   NORMAL PATTERN OF PERFUSION IN ALL REGIONS. POST STRESS VENTICULAR SIZE IS NORMAL. POST  STESS EF 85%.  NORMAL MYOCARDIAL PERFUSION STUDY.   OPEN REDUCTION INTERNAL FIXATION (ORIF) DISTAL PHALANX Left 11/16/2018   Procedure: MIDDLE FINGER OPEN REDUCTION VERSUS RECONSTRUCTION;  Surgeon: Dominica Severin, MD;  Location: [facility] OR;  Service: Orthopedics;  Laterality: Left;   PACEMAKER INSERTION     POLYPECTOMY  03/03/2017   Procedure: POLYPECTOMY;  Surgeon: West Bali, MD;  Location: [facility];  Service: Endoscopy;;  colon   PPM GENERATOR CHANGEOUT N/A 06/18/2020   Procedure: PPM GENERATOR CHANGEOUT;  Surgeon: Thurmon Fair, MD;  Location: [facility] INVASIVE CV LAB;  Service: Cardiovascular;  Laterality: N/A;   TONSILLECTOMY     YAG LASER APPLICATION Bilateral 11/20/2014   Procedure: YAG LASER APPLICATION;  Surgeon: Susa Simmonds, MD;  Location: [facility] ORS;  Service: Ophthalmology;  Laterality: Bilateral;     Home Medications:  Prior to Admission medications   Medication Sig Start Date End Date Taking? Authorizing Provider  apixaban (ELIQUIS) 2.5 MG TABS tablet Take 1 tablet (2.5 mg total) by mouth 2 (two) times daily. 09/01/22  Yes Croitoru, Mihai, MD  BLINK TEARS 0.25 % SOLN Place 1 drop into both eyes 3 (three) times daily.   Yes [provider]  calcium carbonate (TUMS - DOSED IN MG ELEMENTAL CALCIUM) 500 MG chewable tablet Chew 1 tablet by mouth every 8 (eight) hours as needed for indigestion or heartburn.   Yes [provider]  carvedilol (COREG) 6.25 MG tablet TAKE 1 TABLET BY MOUTH TWICE  DAILY WITH MEALS 12/19/22  Yes Croitoru, Mihai, MD  dicyclomine (BENTYL) 20 MG tablet Take 20 mg by mouth every 8 (eight) hours as needed (for gas).   Yes [provider]  ELIQUIS 2.5 MG TABS tablet TAKE 1 TABLET BY MOUTH TWICE  DAILY 12/29/22  Yes Croitoru, Mihai, MD  furosemide (LASIX) 20 MG tablet Take 1 tablet (20 mg total) by mouth daily as needed for edema (for worsening leg swelling or shortness of breath). Patient taking differently: Take 20 mg by mouth 2 (two) times daily. 10/24/21  Yes Arrien, York Ram, MD  meloxicam (MOBIC) 7.5 MG tablet  Take 7.5 mg by mouth every 12 (twelve) hours as needed (or osteoarthritis).   Yes [provider]  mirtazapine (REMERON) 15 MG tablet Take 15 mg by mouth at bedtime.   Yes [provider]  Multiple Vitamins-Minerals (OCUVITE ADULT FORMULA) CAPS Take 1 capsule by mouth in the morning.   Yes [provider]  Multiple Vitamins-Minerals (PRESERVISION AREDS 2) CAPS Take 1 capsule by mouth every 12 (twelve) hours.   Yes [provider]  omeprazole (PRILOSEC) 20 MG capsule Take 1 capsule (20 mg total) by mouth daily. Patient taking differently: Take 20 mg by mouth daily before breakfast. 12/30/22  Yes Croitoru, Mihai, MD  sertraline (ZOLOFT) 50 MG tablet Take 50 mg by mouth in the morning.   Yes [provider]  SYSTANE 0.4-0.3 % GEL ophthalmic gel Place 1 Application into both eyes every 8 (eight) hours as needed (for dryness or irritation).   Yes [provider]  traMADol (ULTRAM) 50 MG tablet Take 50 mg by mouth every 12 (twelve) hours as needed for moderate pain (pain score 4-6) or severe pain (pain score 7-10).   Yes  [provider]  dicyclomine (BENTYL) 10 MG capsule Take 1 capsule (10 mg total) by mouth 3 (three) times daily before meals. Patient not taking: Reported on 05/03/2023 05/24/21   Triplett, Tammy, PA-C  potassium chloride (KLOR-CON M) 10 MEQ tablet Take 1 tablet (10 mEq total) by mouth daily as needed (take with furosemide). Patient not taking: Reported on 05/03/2023 10/24/21   Arrien, York Ram, MD  tiZANidine (ZANAFLEX) 4 MG tablet Take 1 tablet (4 mg total) by mouth every 6 (six) hours as needed (for headaches). Patient not taking: Reported on 05/03/2023 10/03/21   Alberteen Sam, MD  zolpidem (AMBIEN) 5 MG tablet Take 1 tablet (5 mg total) by mouth at bedtime. Patient not taking: Reported on 05/03/2023 10/03/21   Alberteen Sam, MD    Inpatient Medications: Scheduled Meds:  carvedilol  6.25 mg Oral BID WC   [START ON 05/05/2023] furosemide  60 mg Intravenous Daily   mirtazapine  15 mg Oral QHS   pantoprazole  40 mg Oral Daily   sertraline  50 mg Oral Daily   traZODone  50 mg Oral QHS   Continuous Infusions:  PRN Meds: acetaminophen **OR** acetaminophen, ondansetron **OR** ondansetron (ZOFRAN) IV, oxyCODONE  Allergies:    Allergies  Allergen Reactions   Penicillins Hives    DID THE REACTION INVOLVE: Swelling of the face/tongue/throat, SOB, or low BP? Unknown  Sudden or severe rash/hives, skin peeling, or the inside of the mouth or nose? Yes  Did it require medical treatment? Yes  When did it last happen?    39 or 89 years old    If all above answers are "NO", may proceed with cephalosporin use.  DID THE REACTION INVOLVE: Swelling of the face/tongue/throat, SOB, or low BP? Unknown  Sudden or severe rash/hives, skin peeling, or the inside of the mouth or nose? Yes  Did it require medical treatment? Yes  When did it last happen?    61 or 89 years old    If all above answers are "NO", may proceed with cephalosporin use.   Ceftriaxone Itching    Camphor Other (See Comments)    "Allergic," per facility   Ciprofloxacin Diarrhea and Other (See Comments)    Possibly caused diarrhea November 2018   Flagyl [Metronidazole] Other (See Comments)    Possibly caused diarrhea November 2018   Papaya Derivatives  Hives   Iodine Rash and Other (See Comments)    If injected,  Rash/irritated skin reaction "welts"   Sulfa Antibiotics Rash    Social History:   Social History   Socioeconomic History   Marital status: Widowed    Spouse name: Not on file   Number of children: Not on file   Years of education: Not on file   Highest education level: Some college, no degree  Occupational History   Occupation: Public librarian    Employer: RETIRED    Comment: retired  Tobacco Use   Smoking status: Former    Current packs/day: 0.00    Average packs/day: 0.5 packs/day for 30.0 years (15.0 ttl pk-yrs)    Types: Cigarettes    Start date: 12/14/1974    Quit date: 12/13/2004    Years since quitting: 18.4   Smokeless tobacco: Never   Tobacco comments:    former smoker  Vaping Use   Vaping status: Never Used  Substance and Sexual Activity   Alcohol use: Not Currently    Comment: history of drinking a 1/2 glass of wine in the evening   Drug use: No   Sexual activity: Never  Other Topics Concern   Not on file  Social History Narrative   No home exercise program. PT ordered this week.   Social Drivers of Corporate investment banker Strain: Low Risk  (01/06/2023)   Received from [facility]   Overall Financial Resource Strain (CARDIA)    Difficulty of Paying Living Expenses: Not very hard  Food Insecurity: No Food Insecurity (01/06/2023)   Received from [facility]   Hunger Vital Sign    Worried About Running Out of Food in the Last Year: Never true    Ran Out of Food in the Last Year: Never true  Transportation Needs: Unmet Transportation Needs (01/06/2023)   Received from [facility]   PRAPARE - Transportation    Lack of  Transportation (Medical): Yes    Lack of Transportation (Non-Medical): Not on file  Physical Activity: Inactive (08/15/2019)   Exercise Vital Sign    Days of Exercise per Week: 0 days    Minutes of Exercise per Session: 0 min  Stress: No Stress Concern Present (10/22/2018)   [employer] of Occupational Health - Occupational Stress Questionnaire    Feeling of Stress : Only a little  Social Connections: Moderately Isolated (01/06/2023)   Received from [facility]   Social Connection and Isolation Panel [NHANES]    Frequency of Communication with Friends and Family: Once a week    Frequency of Social Gatherings with Friends and Family: Once a week    Attends Religious Services: 1 to 4 times per year    Active Member of [employer] or Organizations: No    Attends Banker Meetings: 1 to 4 times per year    Marital Status: Widowed  Intimate Partner Violence: Not At Risk (10/22/2018)   Humiliation, Afraid, Rape, and Kick questionnaire    Fear of Current or Ex-Partner: No    Emotionally Abused: No    Physically Abused: No    Sexually Abused: No    Family History:    Family History  Problem Relation Age of Onset   Cancer Sister    Asthma Sister    Heart failure Brother    Pulmonary embolism Brother    Cancer Brother    Colon cancer Neg Hx      ROS:  Please see  the history of present illness.   All other ROS reviewed and negative.     Physical Exam/Data:   Vitals:   05/04/23 0751 05/04/23 0830 05/04/23 0930 05/04/23 1122  BP:  (!) 100/43 128/67   Pulse: 64 60 62   Resp: 18 15 (!) 21   Temp:    (!) 94.7 F (34.8 C)  TempSrc:    Rectal  SpO2: 98% 95% 90%   Weight:      Height:       No intake or output data in the 24 hours ending 05/04/23 1144    05/03/2023    5:53 PM 12/11/2022   11:33 PM 09/01/2022   10:32 AM  Last 3 Weights  Weight (lbs) 125 lb 123 lb 7.3 oz 124 lb 6.4 oz  Weight (kg) 56.7 kg 56 kg 56.427 kg     Body mass index is 19.58 kg/m.   General:  thin, frail elderly female in NAD HEENT: normal Neck: + JVD Vascular: No carotid bruits; Distal pulses 2+ bilaterally Cardiac:  regular rhythm, regular rate Lungs:  minimal air movement  Abd: soft, nontender, no hepatomegaly  Ext: no edema Musculoskeletal:  No deformities, BUE and BLE strength normal and equal Skin: warm and dry  Neuro:  CNs 2-12 intact, no focal abnormalities noted Psych:  Normal affect   EKG:  The EKG was personally reviewed and demonstrates:  V-paced rhythm Telemetry:  Telemetry was personally reviewed and demonstrates:  paced rhythm, rate controlled in the 60s  Relevant CV Studies:  Echo pending  Laboratory Data:  High Sensitivity Troponin:   Recent Labs  Lab 05/03/23 1850 05/03/23 2135  TROPONINIHS 16 15     Chemistry Recent Labs  Lab 05/03/23 1850 05/04/23 0705  NA 127* 127*  K 4.8 4.0  CL 93* 90*  CO2 25 28  GLUCOSE 93 103*  BUN 40* 42*  CREATININE 1.22* 1.29*  CALCIUM 8.7* 8.6*  GFRNONAA 41* 38*  ANIONGAP 9 9    No results for input(s): "PROT", "ALBUMIN", "AST", "ALT", "ALKPHOS", "BILITOT" in the last 168 hours. Lipids No results for input(s): "CHOL", "TRIG", "HDL", "LABVLDL", "LDLCALC", "CHOLHDL" in the last 168 hours.  Hematology Recent Labs  Lab 05/03/23 1850 05/04/23 0705 05/04/23 1108  WBC 5.7 4.9  --   RBC 3.05* 2.94* 3.11*  HGB 8.1* 7.9*  --   HCT 25.6* 24.3*  --   MCV 83.9 82.7  --   MCH 26.6 26.9  --   MCHC 31.6 32.5  --   RDW 18.2* 18.1*  --   PLT 202 177  --    Thyroid No results for input(s): "TSH", "FREET4" in the last 168 hours.  BNP Recent Labs  Lab 05/03/23 1850  BNP 598.0*    DDimer No results for input(s): "DDIMER" in the last 168 hours.   Radiology/Studies:  DG Chest Port 1 View Result Date: 05/03/2023 CLINICAL DATA:  Dizziness and shortness of breath. Weeping edema both lower extremities. EXAM: PORTABLE CHEST 1 VIEW COMPARISON:  Portable chest 01/05/2023, 01/01/2023, 12/23/2022  FINDINGS: Left chest dual lead pacemaker, wiring and additional AID wire are unaltered. The heart is enlarged. There is increased central vascular prominence and flow cephalization, interval development of mild basilar and central interstitial edema, small pleural effusions. No focal airspace disease is seen. Bilateral diaphragmatic eventrations are again noted. The aorta is tortuous and calcified with stable mediastinum. Chronic biapical pleural-parenchymal scarring and calcifications are again shown. No new osseous findings. Osteopenia and degenerative change of  the spine. IMPRESSION: 1. Cardiomegaly with increased central vascular prominence and flow cephalization, interval development of mild basilar and central interstitial edema, small pleural effusions. Findings are consistent with CHF. 2. Aortic atherosclerosis with heavy calcification, uncoiling. Electronically Signed   By: Almira Bar M.D.   On: 05/03/2023 22:05     Assessment and Plan:   Acute on chronic diastolic heart failure - Presented with worsening lower extremity swelling and erythema - BNP elevated to 600 -Was taking 20 mg Lasix twice daily - Echocardiogram pending - Has had preserved LVEF on echocardiograms in 2016 and 2021 - has received IV lasix 40 mg and 60 mg x 2 doses, diuresis complicated by renal function and hyponatremia - agree with diuresis but need to monitor BMP   Lower extremity swelling - likely a component of CHF, but appears bright red, unlike typical chronic skin changes - consider rule out DVT - will follow progress with IV lasix   Hypothyroidism Hypothermia - TSH 5.234, above her baseline -- temp a slow as 93.9 F   Permanent Afib s/p AV node ablation High grade heart block now with PPM - telemetry with V-paced rhythm - pacer dependent, rate controlled Afib   Chronic anticoagulation Acute anemia - Hb 7.9 - positive stool card - GI consulted - agree with holding eliquis    Risk  Assessment/Risk Scores:       For questions or updates, please contact [facility] Please consult [email or web address] for contact info under    Signed, Marcelino Duster, PA  05/04/2023 11:44 AM   Attending Note:   The patient was seen and examined.  Agree with assessment and plan as noted above.  Changes made to the above note as needed.  Patient seen and independently examined with Bettina Gavia, PA .   We discussed all aspects of the encounter. I agree with the assessment and plan as stated above.     Chronic diastolic CHF exacerbated by anemia .   Lupita Leash has a long hx of mild chronic diastolic CHF .   Has had a fall of her Hb from 11.4 down to 7.9 over the past 4 months   I suspect this is the cause of her recent shortness of breath   She has had progressive leg swelling but spends most of her day in a chair. Very little activity Agree with gentle diuresis with IV lasix .   2. Anemia :   further evaluation and management per primary team and GI.   3.  Hx of chronic anticoagulation:  agree with holding eliquis for now         I have spent a total of 40 minutes with patient reviewing hospital  notes , telemetry, EKGs, labs and examining patient as well as establishing an assessment and plan that was discussed with the patient.  > 50% of time was spent in direct patient care.    Vesta Mixer, Montez Hageman., MD, [facility] 05/04/2023, 3:28 PM [address]  [address] Office [phone] Pager [phone]

## 2023-05-04 NOTE — ED Notes (Signed)
Provider made aware of rectal temp 94.3. Provider wants bair hugger applied. Paramedic Jessieca Rhem asked Arline Asp NT to apply [employer].

## 2023-05-04 NOTE — ED Notes (Signed)
Pt requested a 3rd cut of coffee, pt hasn't had any rest all night.

## 2023-05-04 NOTE — Plan of Care (Signed)

## 2023-05-04 NOTE — ED Notes (Signed)
Changed pt and bed linen, pt resting in bed

## 2023-05-04 NOTE — ED Notes (Signed)
Tried assisting pt to bedside commode, pt got herself  on the commode. Pt requested a warm rag, I explained to her I can't control the temp. Gave pt rag after running it through  water, pt got upset it wasn't warm enough and threw rag at me. Pt then sat there, writer proceed to ask pt what's next that I can help with, pt states I am going on sit on this bedside commode until my bottom dries. Pt didn't want any further assistance at the moment will checked back in, in a few minutes.

## 2023-05-04 NOTE — ED Notes (Addendum)
Provided pt with a cup of decaf coffee, pt up watching tv

## 2023-05-05 DIAGNOSIS — I5031 Acute diastolic (congestive) heart failure: Secondary | ICD-10-CM | POA: Insufficient documentation

## 2023-05-05 DIAGNOSIS — I5033 Acute on chronic diastolic (congestive) heart failure: Secondary | ICD-10-CM

## 2023-05-05 LAB — CBC
HCT: 26 % — ABNORMAL LOW (ref 36.0–46.0)
Hemoglobin: 8.2 g/dL — ABNORMAL LOW (ref 12.0–15.0)
MCH: 26.5 pg (ref 26.0–34.0)
MCHC: 31.5 g/dL (ref 30.0–36.0)
MCV: 83.9 fL (ref 80.0–100.0)
Platelets: 172 10*3/uL (ref 150–400)
RBC: 3.1 MIL/uL — ABNORMAL LOW (ref 3.87–5.11)
RDW: 18.3 % — ABNORMAL HIGH (ref 11.5–15.5)
WBC: 4.2 10*3/uL (ref 4.0–10.5)
nRBC: 0 % (ref 0.0–0.2)

## 2023-05-05 LAB — BASIC METABOLIC PANEL
Anion gap: 8 (ref 5–15)
BUN: 46 mg/dL — ABNORMAL HIGH (ref 8–23)
CO2: 27 mmol/L (ref 22–32)
Calcium: 8.1 mg/dL — ABNORMAL LOW (ref 8.9–10.3)
Chloride: 91 mmol/L — ABNORMAL LOW (ref 98–111)
Creatinine, Ser: 1.21 mg/dL — ABNORMAL HIGH (ref 0.44–1.00)
GFR, Estimated: 42 mL/min — ABNORMAL LOW (ref >60)
Glucose, Bld: 89 mg/dL (ref 70–99)
Potassium: 3.7 mmol/L (ref 3.5–5.1)
Sodium: 126 mmol/L — ABNORMAL LOW (ref 135–145)

## 2023-05-05 LAB — SODIUM, URINE, RANDOM: Sodium, Ur: 53 mmol/L

## 2023-05-05 LAB — LACTIC ACID, PLASMA: Lactic Acid, Venous: 1.5 mmol/L (ref 0.5–1.9)

## 2023-05-05 LAB — OSMOLALITY, URINE: Osmolality, Ur: 315 mosm/kg (ref 300–900)

## 2023-05-05 LAB — MAGNESIUM: Magnesium: 2.1 mg/dL (ref 1.7–2.4)

## 2023-05-05 LAB — T4, FREE: Free T4: 0.91 ng/dL (ref 0.61–1.12)

## 2023-05-05 MED ORDER — FUROSEMIDE 10 MG/ML IJ SOLN
60.0000 mg | Freq: Once | INTRAMUSCULAR | Status: AC
  Administered 2023-05-05: 60 mg via INTRAVENOUS
  Filled 2023-05-05: qty 6

## 2023-05-05 MED ORDER — FUROSEMIDE 10 MG/ML IJ SOLN
60.0000 mg | Freq: Two times a day (BID) | INTRAMUSCULAR | Status: DC
  Administered 2023-05-05: 60 mg via INTRAVENOUS
  Filled 2023-05-05: qty 6

## 2023-05-05 MED ORDER — ALBUMIN HUMAN 25 % IV SOLN
12.5000 g | Freq: Three times a day (TID) | INTRAVENOUS | Status: AC
  Administered 2023-05-05 (×3): 12.5 g via INTRAVENOUS
  Filled 2023-05-05 (×3): qty 50

## 2023-05-05 MED ORDER — FUROSEMIDE 10 MG/ML IJ SOLN: 20.0000 mg | Freq: Two times a day (BID) | INTRAMUSCULAR | Status: DC

## 2023-05-05 MED ORDER — FUROSEMIDE 10 MG/ML IJ SOLN: 60.0000 mg | Freq: Two times a day (BID) | INTRAMUSCULAR | Status: DC

## 2023-05-05 NOTE — Progress Notes (Signed)
 [facility] Progress Note  SUBJECTIVE:   Interval history: Kimberly [name] was seen and evaluated today at bedside. Resting comfortably in chair at bedside, eating lunch. Noted having some diffuse abdominal pain, worse in bilateral lower quadrants and epigastrium. No nausea or vomiting. No current blood per rectum. In ICU given labile blood pressure.   Past Medical History:  Diagnosis Date   Acute blood loss anemia 10/11/2012   Acute diverticulitis 08/24/2013   Acute on chronic combined systolic and diastolic CHF, NYHA class 4 (HCC) 11/15/2013   Antral ulcer 10/11/2012   Aortic atherosclerosis (HCC)    Atrial fibrillation (HCC)    Bipolar affective disorder (HCC) 03/23/2017   Cardiomyopathy, nonischemic (HCC)    Chronic anticoagulation 10/12/2012   CKD (chronic kidney disease) stage 3, GFR 30-59 ml/min (HCC) 10/12/2012   Depression    Diverticulosis    DVT (deep venous thrombosis) (HCC)    left peroneal veins/IVC Filter placed as per hospital discharge 10/24/21   Erosive esophagitis 10/11/2012   Esophagitis    Fibromyalgia    Glaucoma    H/O echocardiogram 2007   EF 40-45%,          Hiatal hernia    History of anemia due to CKD    Hypertension    Internal hemorrhoids    Nephrolithiasis    Osteoarthritis    Pacemaker    Last saw cards 07/2013   Prepyloric ulcer    Scoliosis    Tubular adenoma of colon    Past Surgical History:  Procedure Laterality Date   ABDOMINAL HYSTERECTOMY     APPENDECTOMY     BACK SURGERY     BIOPSY  03/03/2017   Procedure: BIOPSY;  Surgeon: Harvey Margo LITTIE, MD;  Location: [facility];  Service: Endoscopy;;  gastric   BREAST SURGERY     CARDIAC CATHETERIZATION  12/08/2005   LAD AND LEFT MAIN WITH NO HIGH-GRADE STENOSIS. MILD DISEASE IN THE CX AND LAD SYSTEM. SEVERE LV DYSFUNCTION WITH DILATION OF THE LV. EF 15-20%. LV END-DIASTOLIC PRESSURE IS 90. +1 MR.   CHOLECYSTECTOMY     COLONOSCOPY N/A 03/03/2017   Dr. Harvey: 5 colon polyps  removed, adenomatous.  Diverticulosis.  99% of the colon was cleared but the cecum was not adequately seen.   CYSTOSCOPY N/A 02/24/2013   Procedure: CYSTOSCOPY WITH URETHRAL DILITATION;  Surgeon: Mohammad I Javaid, MD;  Location: [facility] ORS;  Service: Urology;  Laterality: N/A;   DOPPLER ECHOCARDIOGRAPHY N/A 05/30/2010   LV SIZE IS NORMAL. LV SYSTOLIC FUNCTION IS LOW NORMAL. EF=50-55%. MILD INFERIOR HYPOKINESIS.MILD TO MODERATE POSTERIOR WALL HYPOKINESIS.PACEMAKER LEAD IN THE RV. LA IS MILDLY DILATED. RA IS MODERATE TO SEVERLY DILATED. PACEMAKER LEAD IN THE RA. MILD CALCICICATION OF THE MV APPARATUS. MODERATE MR. MILD TO MODERATE TR. MILD PHTN.AV MILDLY SCLEROTIC.   ESOPHAGOGASTRODUODENOSCOPY N/A 10/13/2012   Dr. Shaaron: severe ulcerative reflux esophagitis, question of Barrett's but negative path, single deep prepyloric antral ulcer, negative H.pylori   ESOPHAGOGASTRODUODENOSCOPY N/A 03/03/2017   Dr. Harvey: Gastritis/duodenitis, no H. pylori   ESOPHAGOGASTRODUODENOSCOPY (EGD) WITH PROPOFOL  N/A 10/01/2021   Procedure: ESOPHAGOGASTRODUODENOSCOPY (EGD) WITH PROPOFOL ;  Surgeon: Albertus Gordy HERO, MD;  Location: [facility] ENDOSCOPY;  Service: Gastroenterology;  Laterality: N/A;   HERNIA REPAIR     right inguinal hernia and umbilical   LOWETR EXT VENOUS Bilateral 11-08-10   R & L- NO EVIDENCE OF THROMBUS OR THROMBOPHLEBITIS. THERE IS MILD AMOUNT OF SUBCUTANEOUS EDEMA NOTED WITHIN THE LEFT CALF AND ANKLE. R & L GSV  AND SSV- NO VENOUS INSUFF NOTED.   NECK SURGERY     NUCLEAR STRESS TEST N/A 02/13/2009   NORMAL PATTERN OF PERFUSION IN ALL REGIONS. POST STRESS VENTICULAR SIZE IS NORMAL. POST  STESS EF 85%.  NORMAL MYOCARDIAL PERFUSION STUDY.   OPEN REDUCTION INTERNAL FIXATION (ORIF) DISTAL PHALANX Left 11/16/2018   Procedure: MIDDLE FINGER OPEN REDUCTION VERSUS RECONSTRUCTION;  Surgeon: Camella Fallow, MD;  Location: [facility] OR;  Service: Orthopedics;  Laterality: Left;   PACEMAKER INSERTION     POLYPECTOMY  03/03/2017    Procedure: POLYPECTOMY;  Surgeon: Harvey Margo CROME, MD;  Location: [facility];  Service: Endoscopy;;  colon   PPM GENERATOR CHANGEOUT N/A 06/18/2020   Procedure: PPM GENERATOR CHANGEOUT;  Surgeon: Francyne Headland, MD;  Location: [facility] INVASIVE CV LAB;  Service: Cardiovascular;  Laterality: N/A;   TONSILLECTOMY     YAG LASER APPLICATION Bilateral 11/20/2014   Procedure: YAG LASER APPLICATION;  Surgeon: Dow JULIANNA Burke, MD;  Location: [facility] ORS;  Service: Ophthalmology;  Laterality: Bilateral;   Current Facility-Administered Medications  Medication Dose Route Frequency Provider Last Rate Last Admin   acetaminophen  (TYLENOL ) tablet 650 mg  650 mg Oral Q6H PRN Dena Charleston, MD   650 mg at 05/05/23 1020   Or   acetaminophen  (TYLENOL ) suppository 650 mg  650 mg Rectal Q6H PRN Dena Charleston, MD       albumin  human 25 % solution 12.5 g  12.5 g Intravenous TID Regalado, Belkys A, MD 60 mL/hr at 05/05/23 1019 12.5 g at 05/05/23 1019   Chlorhexidine  Gluconate Cloth 2 % PADS 6 each  6 each Topical Daily Regalado, Belkys A, MD   6 each at 05/04/23 1741   cyanocobalamin  (VITAMIN B12) tablet 1,000 mcg  1,000 mcg Oral Daily Regalado, Belkys A, MD   1,000 mcg at 05/05/23 1021   doxycycline  (VIBRA -TABS) tablet 100 mg  100 mg Oral Q12H Regalado, Belkys A, MD   100 mg at 05/05/23 1021   furosemide  (LASIX ) injection 60 mg  60 mg Intravenous Once Haley, Sheng L, PA-C       furosemide  (LASIX ) injection 60 mg  60 mg Intravenous BID Haley, Sheng L, PA-C       mirtazapine  (REMERON ) tablet 15 mg  15 mg Oral QHS Dorrell, Robert, MD   15 mg at 05/04/23 2221   ondansetron  (ZOFRAN ) tablet 4 mg  4 mg Oral Q6H PRN Dena Charleston, MD       Or   ondansetron  (ZOFRAN ) injection 4 mg  4 mg Intravenous Q6H PRN Dorrell, Robert, MD       Oral care mouth rinse  15 mL Mouth Rinse PRN Regalado, Belkys A, MD       oxyCODONE  (Oxy IR/ROXICODONE ) immediate release tablet 5 mg  5 mg Oral Q4H PRN Dena Charleston, MD   5 mg at 05/05/23 9487    pantoprazole  (PROTONIX ) EC tablet 40 mg  40 mg Oral Daily Dorrell, Charleston, MD   40 mg at 05/05/23 1021   sertraline  (ZOLOFT ) tablet 50 mg  50 mg Oral Daily Dorrell, Charleston, MD   50 mg at 05/05/23 1021   traZODone  (DESYREL ) tablet 50 mg  50 mg Oral QHS Dorrell, Robert, MD   50 mg at 05/04/23 2221   Allergies as of 05/03/2023 - Review Complete 05/03/2023  Allergen Reaction Noted   Penicillins Hives 01/22/2011   Ceftriaxone  Itching 01/08/2023   Camphor Other (See Comments) 05/03/2023   Ciprofloxacin  Diarrhea and Other (See Comments) 04/10/2014   Flagyl  [metronidazole ]  Other (See Comments) 03/21/2017   Papaya derivatives Hives 01/22/2011   Iodine Rash and Other (See Comments) 01/22/2011   Sulfa antibiotics Rash 01/22/2011   Review of Systems:  Review of Systems  Respiratory:  Positive for shortness of breath (chronic per patient).   Cardiovascular:  Negative for chest pain.  Gastrointestinal:  Positive for abdominal pain. Negative for nausea and vomiting.    OBJECTIVE:   Temp:  [93.5 F (34.2 C)-96.2 F (35.7 C)] 93.5 F (34.2 C) (12/31 0900) Pulse Rate:  [57-68] 60 (12/31 1100) Resp:  [12-27] 17 (12/31 1100) BP: (81-169)/(32-99) 128/50 (12/31 1000) SpO2:  [91 %-100 %] 95 % (12/31 1100) Last BM Date : 05/04/23 Physical Exam Constitutional:      General: She is not in acute distress.    Appearance: She is not ill-appearing, toxic-appearing or diaphoretic.  Cardiovascular:     Rate and Rhythm: Normal rate and regular rhythm.  Pulmonary:     Effort: No respiratory distress.     Breath sounds: Normal breath sounds.  Abdominal:     General: Bowel sounds are normal. There is no distension.     Palpations: Abdomen is soft.     Tenderness: There is abdominal tenderness. There is no guarding.  Musculoskeletal:        General: Tenderness (bilateral lower extremities) present.     Right lower leg: Edema present.     Left lower leg: Edema present.  Skin:    General: Skin is warm  and dry.  Neurological:     Mental Status: She is alert.     Labs: Recent Labs    05/03/23 1850 05/04/23 0705 05/05/23 0930  WBC 5.7 4.9 4.2  HGB 8.1* 7.9* 8.2*  HCT 25.6* 24.3* 26.0*  PLT 202 177 172   BMET Recent Labs    05/03/23 1850 05/04/23 0705 05/05/23 0803  NA 127* 127* 126*  K 4.8 4.0 3.7  CL 93* 90* 91*  CO2 25 28 27   GLUCOSE 93 103* 89  BUN 40* 42* 46*  CREATININE 1.22* 1.29* 1.21*  CALCIUM  8.7* 8.6* 8.1*   LFT No results for input(s): PROT, ALBUMIN , AST, ALT, ALKPHOS, BILITOT, BILIDIR, IBILI in the last 72 hours. PT/INR No results for input(s): LABPROT, INR in the last 72 hours. Diagnostic imaging: CT ABDOMEN PELVIS WO CONTRAST Result Date: 05/05/2023 CLINICAL DATA:  Anemia. Fall a possible splenic flexure diverticulitis. EXAM: CT ABDOMEN AND PELVIS WITHOUT CONTRAST TECHNIQUE: Multidetector CT imaging of the abdomen and pelvis was performed following the standard protocol without IV contrast. RADIATION DOSE REDUCTION: This exam was performed according to the departmental dose-optimization program which includes automated exposure control, adjustment of the mA and/or kV according to patient size and/or use of iterative reconstruction technique. COMPARISON:  CT 12/23/2022 FINDINGS: Lower chest: Small bilateral pleural effusions, right greater than left. Increased atelectasis in the dependent lower lobes. The heart is enlarged. Coronary artery calcifications. Hepatobiliary: Stable cysts in the liver. No new liver abnormality. Cholecystectomy. Stable biliary tree. Pancreas: Atrophic.  No ductal dilatation or inflammation. Spleen: Calcified granuloma.  No splenomegaly. Adrenals/Urinary Tract: No adrenal nodules. No hydronephrosis. Punctate stone in the lower pole of the right kidney. No ureteral stone. Physiologically distended urinary bladder with multiple diverticula. Stomach/Bowel: Colonic diverticulosis, extensive involving the sigmoid. No  diverticulitis or acute colonic inflammation. Particularly, no evidence of splenic diverticulitis. No small bowel obstruction. There is a moderate hiatal hernia. Appendix not confidently seen. Vascular/Lymphatic: Advanced aortic and branch atherosclerosis. No portal venous or mesenteric gas.  No adenopathy. Reproductive: Status post hysterectomy. No adnexal masses. Other: No free air or ascites. Laxity of the anterior abdominal wall. Small supraumbilical ventral abdominal wall hernia contains only fat. Musculoskeletal: Severe scoliosis and degenerative change in the spine. Chronic T12 superior endplate compression deformity. IMPRESSION: 1. Colonic diverticulosis without diverticulitis. Specifically, no diverticulitis of the splenic flexure. 2. Moderate hiatal hernia. 3. Small bilateral pleural effusions, right greater than left. Increased atelectasis in the dependent lower lobes since August. 4. Cardiomegaly. 5. Punctate nonobstructing right renal stone. Aortic Atherosclerosis (ICD10-I70.0). Electronically Signed   By: Andrea Gasman M.D.   On: 05/05/2023 02:09   ECHOCARDIOGRAM COMPLETE Result Date: 05/04/2023    ECHOCARDIOGRAM REPORT   Patient Name:   Kimberly [name] Date of Exam: 05/04/2023 Medical Rec #:  [id number]     Height:       67.0 in Accession #:    [id number]    Weight:       125.0 lb Date of Birth:  08-20-1926     BSA:          1.656 m Patient Age:    89 years      BP:           147/79 mmHg Patient Gender: F             HR:           60 bpm. Exam Location:  Inpatient Procedure: 2D Echo, Cardiac Doppler and Color Doppler Indications:    Congestive Heart Failure I50.9  History:        Patient has prior history of Echocardiogram examinations, most                 recent 11/21/2019. CHF and Cardiomyopathy; Arrythmias:Atrial                 Fibrillation.  Sonographer:    Jayson Gaskins Referring Phys: [id number] ROBERT DORRELL IMPRESSIONS  1. Left ventricular ejection fraction, by estimation, is 50 to 55%. The  left ventricle has low normal function. The left ventricle has no regional wall motion abnormalities. Left ventricular diastolic parameters are indeterminate. Elevated left ventricular end-diastolic pressure.  2. Right ventricular systolic function is normal. The right ventricular size is normal.  3. Left atrial size was severely dilated.  4. Right atrial size was severely dilated.  5. The mitral valve is normal in structure. Mild mitral valve regurgitation. No evidence of mitral stenosis.  6. Tricuspid valve regurgitation is moderate.  7. The aortic valve is tricuspid. There is mild calcification of the aortic valve. There is mild thickening of the aortic valve. Aortic valve regurgitation is not visualized. Mild aortic valve stenosis. FINDINGS  Left Ventricle: Left ventricular ejection fraction, by estimation, is 50 to 55%. The left ventricle has low normal function. The left ventricle has no regional wall motion abnormalities. The left ventricular internal cavity size was normal in size. There is no left ventricular hypertrophy. Left ventricular diastolic parameters are indeterminate. Elevated left ventricular end-diastolic pressure. Right Ventricle: The right ventricular size is normal. No increase in right ventricular wall thickness. Right ventricular systolic function is normal. Left Atrium: Left atrial size was severely dilated. Right Atrium: Right atrial size was severely dilated. Pericardium: There is no evidence of pericardial effusion. Mitral Valve: The mitral valve is normal in structure. Mild mitral valve regurgitation. No evidence of mitral valve stenosis. Tricuspid Valve: The tricuspid valve is normal in structure. Tricuspid valve regurgitation is moderate . No evidence of tricuspid stenosis. Aortic Valve:  The aortic valve is tricuspid. There is mild calcification of the aortic valve. There is mild thickening of the aortic valve. Aortic valve regurgitation is not visualized. Mild aortic stenosis is  present. Aortic valve mean gradient measures  4.0 mmHg. Aortic valve peak gradient measures 7.1 mmHg. Aortic valve area, by VTI measures 1.14 cm. Pulmonic Valve: The pulmonic valve was normal in structure. Pulmonic valve regurgitation is not visualized. No evidence of pulmonic stenosis. Aorta: The aortic root is normal in size and structure. IAS/Shunts: No atrial level shunt detected by color flow Doppler. Additional Comments: A device lead is visualized.  LEFT VENTRICLE PLAX 2D LVIDd:         4.30 cm   Diastology LVIDs:         3.10 cm   LV e' medial:    5.33 cm/s LV PW:         1.00 cm   LV E/e' medial:  18.7 LV IVS:        1.00 cm   LV e' lateral:   6.53 cm/s LVOT diam:     1.70 cm   LV E/e' lateral: 15.3 LV SV:         35 LV SV Index:   21 LVOT Area:     2.27 cm  LEFT ATRIUM             Index LA Vol (A2C):   71.3 ml 43.05 ml/m LA Vol (A4C):   76.0 ml 45.89 ml/m LA Biplane Vol: 74.9 ml 45.23 ml/m  AORTIC VALVE AV Area (Vmax):    1.49 cm AV Area (Vmean):   1.20 cm AV Area (VTI):     1.14 cm AV Vmax:           133.00 cm/s AV Vmean:          90.200 cm/s AV VTI:            0.307 m AV Peak Grad:      7.1 mmHg AV Mean Grad:      4.0 mmHg LVOT Vmax:         87.50 cm/s LVOT Vmean:        47.800 cm/s LVOT VTI:          0.154 m LVOT/AV VTI ratio: 0.50  AORTA Ao Root diam: 3.20 cm MITRAL VALVE               TRICUSPID VALVE MV Area (PHT): 2.59 cm    TR Peak grad:   32.3 mmHg MV Decel Time: 293 msec    TR Vmax:        284.00 cm/s MV E velocity: 99.80 cm/s                            SHUNTS                            Systemic VTI:  0.15 m                            Systemic Diam: 1.70 cm Annabella Scarce MD Electronically signed by Annabella Scarce MD Signature Date/Time: 05/04/2023/5:01:17 PM    Final    DG Chest Port 1 View Result Date: 05/03/2023 CLINICAL DATA:  Dizziness and shortness of breath. Weeping edema both lower extremities. EXAM: PORTABLE CHEST 1 VIEW COMPARISON:  Portable chest 01/05/2023, 01/01/2023,  12/23/2022  FINDINGS: Left chest dual lead pacemaker, wiring and additional AID wire are unaltered. The heart is enlarged. There is increased central vascular prominence and flow cephalization, interval development of mild basilar and central interstitial edema, small pleural effusions. No focal airspace disease is seen. Bilateral diaphragmatic eventrations are again noted. The aorta is tortuous and calcified with stable mediastinum. Chronic biapical pleural-parenchymal scarring and calcifications are again shown. No new osseous findings. Osteopenia and degenerative change of the spine. IMPRESSION: 1. Cardiomegaly with increased central vascular prominence and flow cephalization, interval development of mild basilar and central interstitial edema, small pleural effusions. Findings are consistent with CHF. 2. Aortic atherosclerosis with heavy calcification, uncoiling. Electronically Signed   By: Francis Quam M.D.   On: 05/03/2023 22:05   IMPRESSION: Normocytic anemia Fecal occult positive stool Personal history colon polyps Diverticulosis, rectosigmoid, sigmoid Congestive heart failure exacerbation Atrial fibrillation, on Eliquis  prior to admission  Chronic kidney disease History diverticulitis   PLAN: -CT scan abdomen/pelvis results reviewed from yesterday, no sign of mass lesion as etiology of anemia -No sign of active GI blood los, not stable for endoscopic procedure given labile blood pressure -Can consider video capsule endoscopy on 05/07/23 for further evaluation of anemia -Ok for diet as tolerated -[facility] will follow   LOS: 2 days   Estefana Keas, [facility]

## 2023-05-05 NOTE — Progress Notes (Signed)
Patient consistently refuses repositioning in bed/chair.

## 2023-05-05 NOTE — Progress Notes (Addendum)
 Patient Name: Kimberly [name] Date of Encounter: 05/05/2023 [facility] Cardiologist: Jerel Balding, MD    Interval Summary  .     Nurse reports that her MAP has been dropping, blood pressures, have been all over the place at times hypotensive and normotensive.  She reports no significant complaints however does state that she is persistently short of breath.  Denies any chest pain now.  Unclear if diuresis with an accurate I's and O's   Vital Signs .           Vitals:    05/05/23 0800 05/05/23 0900 05/05/23 1000 05/05/23 1100  BP: (!) 138/57 (!) 90/37 (!) 128/50    Pulse: (!) 59 (!) 59 60 60  Resp: 17 14 20 17   Temp:   (!) 93.5 F (34.2 C)      TempSrc:   Rectal      SpO2: 99% 91% 91% 95%  Weight:          Height:              Intake/Output Summary (Last 24 hours) at 05/05/2023 1211 Last data filed at 05/05/2023 0800    Gross per 24 hour  Intake --  Output 725 ml  Net -725 ml        05/03/2023    5:53 PM 12/11/2022   11:33 PM 09/01/2022   10:32 AM  Last 3 Weights  Weight (lbs) 125 lb 123 lb 7.3 oz 124 lb 6.4 oz  Weight (kg) 56.7 kg 56 kg 56.427 kg       Telemetry/ECG    V-paced in the 60s- Personally Reviewed   CV Studies     Echocardiogram 05/04/2023 1. Left ventricular ejection fraction, by estimation, is 50 to 55%. The  left ventricle has low normal function. The left ventricle has no regional  wall motion abnormalities. Left ventricular diastolic parameters are  indeterminate. Elevated left  ventricular end-diastolic pressure.   2. Right ventricular systolic function is normal. The right ventricular  size is normal.   3. Left atrial size was severely dilated.   4. Right atrial size was severely dilated.   5. The mitral valve is normal in structure. Mild mitral valve  regurgitation. No evidence of mitral stenosis.   6. Tricuspid valve regurgitation is moderate.   7. The aortic valve is tricuspid. There is mild calcification of the   aortic valve. There is mild thickening of the aortic valve. Aortic valve  regurgitation is not visualized. Mild aortic valve stenosis.        Physical Exam .   GEN: No acute distress.   Neck: Severe JVD Cardiac: RRR, no murmurs, rubs, or gallops.  Respiratory: Very poor respiratory effort GI: Soft, nontender, non-distended  MS: Probably 1-2+ edema and erythematous.  Patient would not let me touch due to severe pain   Patient Profile    Kimberly [name] is a 89 y.o. female has hx of  permanent atrial fibrillation with AV node ablation and secondary complete heart block s/p PPM in place with GEN change 06/2020, diastolic heart failure with preserved EF by echo in 2021 but history of nonischemic cardiomyopathy with improved EF following cardiac resynchronization therapy and admitted on 05/03/2023 for the evaluation of CHF exacerbation.   Assessment & Plan .     Acute on chronic HFpEF Suspect that she is low output heart failure and will need palliative care.  Unclear if she has been responding to the IV  Lasix  60 mg, need more accurate I's and O's.  She is overtly volume up, mildly symptomatic at this time.  Her MAP has been low and she is cool on exam and has been hypothermic.  Will try to diurese however if unsuccessful may need to transition to comfort care.  Would like to avoid inotropic therapy.  Restarting IV Lasix  60 mg twice daily Check lactic Do not continue beta-blocker   Permanent atrial fibrillation status post AV node ablation High-grade AV block with PPM V-paced in the 60s.  Currently holding Eliquis  due to positive fecal occult, defer to GI when to start.     CKD Seems to be stable at 1.2 right now however this is likely a gross underestimation given her lack of muscle mass.   Hyponatremic hypervolemia Sodium 126.  Poor prognostic indicator hopefully will improve with diuresis.   Anemia 8.2.  Management per primary team.  GI is following.   Goals of care Will discuss  with patient's daughter, unfortunately husband died several years ago and she lives at home alone.  She has a son and daughter who do not live with in the state.  Currently DNR limited.         For questions or updates, please contact [facility] Please consult [email or web address] for contact info under         Signed, Thom LITTIE Sluder, PA-C       Attending Note:    The patient was seen and examined.  Agree with assessment and plan as noted above.  Changes made to the above note as needed.   Patient seen and independently examined with Thom Sluder, PA .   We discussed all aspects of the encounter. I agree with the assessment and plan as stated above.     Acute on chronic HFpEF :   she has underlying CKD.  Its has been difficult to diurese her.  Will continue IV lasix   If we are unsuccessful with diuresing we may need to consider palliative care consult   2.  Atrial fib :   s/p AV node ablation , pacer in place Holding eliquis  due to GI bleed. Will defer to GI as to when we can restart her eliquis     3.  Anemia:  managed by primary team and GI        I have spent a total of 40 minutes with patient reviewing hospital  notes , telemetry, EKGs, labs and examining patient as well as establishing an assessment and plan that was discussed with the patient.  > 50% of time was spent in direct patient care.       Aleene DOROTHA Passe, Mickey., MD, FACC 05/05/2023, 12:52 PM [address]  [address] Office [phone] Pager [phone]

## 2023-05-05 NOTE — Progress Notes (Addendum)
 PROGRESS NOTE    Kimberly [name]  FMW:[id number] DOB: 03-03-1927 DOA: 05/03/2023 PCP: Renato Dorothey HERO, NP   Brief Narrative: 89 year old with past medical history significant for CHF, A-fib, bipolar, hypertension presented to the emergency department with dizziness, shortness of breath, leg swelling.  She has been weeping from her bilateral lower extremity, reports weight gain.  Due to worsening discomfort in her legs she presents to the ED.  She was found to have a sodium of 127, creatinine 1.2, hemoglobin 8.1, BNP 598.  Chest x-ray showed cardiomegaly with increased vascular prominence concerning for volume overload.  Patient received IV Lasix  in the ED. admitted for further evaluation   Assessment & Plan:   Principal Problem:   CHF (congestive heart failure) (HCC)   1-Acute on Chronic Diastolic Heart Failure Exacerbation;  -Present with dyspnea, weight gain, lower extremity edema, chest x-ray with increased vascular prominence.  BNP elevated at 598. -Daily weight -Strict I and O. Negative 725 -ECHO : Ef 50%, indeterminate diastolic Dysfunction.  Lasix  change to 60 mg IV BID by cardiology.   2-Anemia,  occult blood positive Presents with anemia, occult blood positive.  Held Eliquis . Discussed with GI and cardiology.  GI was consulted. BP labile for endoscopy  Anemia panel. Start Iron  and B 12.  CT abdomen pelvis. Negative for Mass Hb stable today.   3-CKD 3B Monitor Cr, stable.    Hypertension: -Continue with Coreg .   Insomnia: On Remeron .   Paroxysmal A-fib: Hold eliquis . Continue with coreg .   GERD: PPI  Depression: Continue with Zoloft .   Hyponatremia:   TSH 5, mildly elevated. Checking free t3 an d T4, Cortisol. 12 normal.  Suspect Hypervolemia form HF>   Hypothermia. TSH. Mildly elevated.  No Leukocytosis, x ray finding consistent with HF.  UA negative for infection.   Chronic LE edema and  redness, venous stasis.  On Doxy, has allergies to  Cephalosporin.  She has been on Eliquis    Hypotension;  IV albumin  order.  Discontinue coreg .   Estimated body mass index is 19.58 kg/m as calculated from the following:   Height as of this encounter: 5' 7 (1.702 m).   Weight as of this encounter: 56.7 kg.   DVT prophylaxis: SCD Code Status: discussed with patient, wishes to be DNR> discussed with Daughter  Family Communication: Daughter updated 12/30 12/31. Plan to consult palliative care for goals of care.  Disposition Plan:  Status is: Inpatient Remains inpatient appropriate because: management of HF    Consultants:  Cardiology   Procedures:  ECHO  Antimicrobials:    Subjective: Alert, report dyspnea.  BP soft today  Objective: Vitals:   05/05/23 0500 05/05/23 0600 05/05/23 0754 05/05/23 0800  BP: (!) 140/39  (!) 117/59   Pulse: (!) 59 60  (!) 59  Resp: 14 13 17 17   Temp:      TempSrc:      SpO2: 100% 100% 99% 99%  Weight:      Height:        Intake/Output Summary (Last 24 hours) at 05/05/2023 0921 Last data filed at 05/04/2023 2029 Gross per 24 hour  Intake --  Output 300 ml  Net -300 ml   Filed Weights   05/03/23 1753  Weight: 56.7 kg    Examination:  General exam: NAD, frail  Respiratory system: BL crackles.  Cardiovascular system: S 1, S 2 RRR Gastrointestinal system: BS present, soft, nt Central nervous system:alert Extremities: BL LE edema, erythema   Data Reviewed: I have  personally reviewed following labs and imaging studies  CBC: Recent Labs  Lab 05/03/23 1850 05/04/23 0705  WBC 5.7 4.9  HGB 8.1* 7.9*  HCT 25.6* 24.3*  MCV 83.9 82.7  PLT 202 177   Basic Metabolic Panel: Recent Labs  Lab 05/03/23 1850 05/04/23 0705 05/05/23 0803  NA 127* 127* 126*  K 4.8 4.0 3.7  CL 93* 90* 91*  CO2 25 28 27   GLUCOSE 93 103* 89  BUN 40* 42* 46*  CREATININE 1.22* 1.29* 1.21*  CALCIUM  8.7* 8.6* 8.1*   GFR: Estimated Creatinine Clearance: 25.4 mL/min (A) (by C-G formula based  on SCr of 1.21 mg/dL (H)). Liver Function Tests: No results for input(s): AST, ALT, ALKPHOS, BILITOT, PROT, ALBUMIN  in the last 168 hours. No results for input(s): LIPASE, AMYLASE in the last 168 hours. No results for input(s): AMMONIA in the last 168 hours. Coagulation Profile: No results for input(s): INR, PROTIME in the last 168 hours. Cardiac Enzymes: No results for input(s): CKTOTAL, CKMB, CKMBINDEX, TROPONINI in the last 168 hours. BNP (last 3 results) No results for input(s): PROBNP in the last 8760 hours. HbA1C: No results for input(s): HGBA1C in the last 72 hours. CBG: Recent Labs  Lab 05/03/23 1905  GLUCAP 94   Lipid Profile: No results for input(s): CHOL, HDL, LDLCALC, TRIG, CHOLHDL, LDLDIRECT in the last 72 hours. Thyroid  Function Tests: Recent Labs    05/04/23 1108 05/05/23 0307  TSH 5.234*  --   FREET4  --  0.91   Anemia Panel: Recent Labs    05/04/23 1108  VITAMINB12 288  FOLATE 11.7  FERRITIN 36  TIBC 484*  IRON  28  RETICCTPCT 1.7   Sepsis Labs: No results for input(s): PROCALCITON, LATICACIDVEN in the last 168 hours.  Recent Results (from the past 240 hours)  Resp panel by RT-PCR (RSV, Flu A&B, Covid) Anterior Nasal Swab     Status: None   Collection Time: 05/03/23  6:41 PM   Specimen: Anterior Nasal Swab  Result Value Ref Range Status   SARS Coronavirus 2 by RT PCR NEGATIVE NEGATIVE Final    Comment: (NOTE) SARS-CoV-2 target nucleic acids are NOT DETECTED.  The SARS-CoV-2 RNA is generally detectable in upper respiratory specimens during the acute phase of infection. The lowest concentration of SARS-CoV-2 viral copies this assay can detect is 138 copies/mL. A negative result does not preclude SARS-Cov-2 infection and should not be used as the sole basis for treatment or other patient management decisions. A negative result may occur with  improper specimen collection/handling, submission of  specimen other than nasopharyngeal swab, presence of viral mutation(s) within the areas targeted by this assay, and inadequate number of viral copies(<138 copies/mL). A negative result must be combined with clinical observations, patient history, and epidemiological information. The expected result is Negative.  Fact Sheet for Patients:  bloggercourse.com  Fact Sheet for Healthcare Providers:  seriousbroker.it  This test is no t yet approved or cleared by the [place]  FDA and  has been authorized for detection and/or diagnosis of SARS-CoV-2 by FDA under an Emergency Use Authorization (EUA). This EUA will remain  in effect (meaning this test can be used) for the duration of the COVID-19 declaration under Section 564(b)(1) of the Act, 21 U.S.C.section 360bbb-3(b)(1), unless the authorization is terminated  or revoked sooner.       Influenza A by PCR NEGATIVE NEGATIVE Final   Influenza B by PCR NEGATIVE NEGATIVE Final    Comment: (NOTE) The Xpert Xpress SARS-CoV-2/FLU/RSV plus assay  is intended as an aid in the diagnosis of influenza from Nasopharyngeal swab specimens and should not be used as a sole basis for treatment. Nasal washings and aspirates are unacceptable for Xpert Xpress SARS-CoV-2/FLU/RSV testing.  Fact Sheet for Patients: bloggercourse.com  Fact Sheet for Healthcare Providers: seriousbroker.it  This test is not yet approved or cleared by the [place]  FDA and has been authorized for detection and/or diagnosis of SARS-CoV-2 by FDA under an Emergency Use Authorization (EUA). This EUA will remain in effect (meaning this test can be used) for the duration of the COVID-19 declaration under Section 564(b)(1) of the Act, 21 U.S.C. section 360bbb-3(b)(1), unless the authorization is terminated or revoked.     Resp Syncytial Virus by PCR NEGATIVE NEGATIVE Final     Comment: (NOTE) Fact Sheet for Patients: bloggercourse.com  Fact Sheet for Healthcare Providers: seriousbroker.it  This test is not yet approved or cleared by the [place]  FDA and has been authorized for detection and/or diagnosis of SARS-CoV-2 by FDA under an Emergency Use Authorization (EUA). This EUA will remain in effect (meaning this test can be used) for the duration of the COVID-19 declaration under Section 564(b)(1) of the Act, 21 U.S.C. section 360bbb-3(b)(1), unless the authorization is terminated or revoked.  Performed at [facility], [address], [place], [place] [address]   MRSA Next Gen by PCR, Nasal     Status: None   Collection Time: 05/04/23  4:00 PM   Specimen: Nasal Mucosa; Nasal Swab  Result Value Ref Range Status   MRSA by PCR Next Gen NOT DETECTED NOT DETECTED Final    Comment: (NOTE) The GeneXpert MRSA Assay (FDA approved for NASAL specimens only), is one component of a comprehensive MRSA colonization surveillance program. It is not intended to diagnose MRSA infection nor to guide or monitor treatment for MRSA infections. Test performance is not FDA approved in patients less than 54 years old. Performed at [facility], [address] [place], [place] [address]          Radiology Studies: CT ABDOMEN PELVIS WO CONTRAST Result Date: 05/05/2023 CLINICAL DATA:  Anemia. Fall a possible splenic flexure diverticulitis. EXAM: CT ABDOMEN AND PELVIS WITHOUT CONTRAST TECHNIQUE: Multidetector CT imaging of the abdomen and pelvis was performed following the standard protocol without IV contrast. RADIATION DOSE REDUCTION: This exam was performed according to the departmental dose-optimization program which includes automated exposure control, adjustment of the mA and/or kV according to patient size and/or use of iterative reconstruction technique. COMPARISON:  CT  12/23/2022 FINDINGS: Lower chest: Small bilateral pleural effusions, right greater than left. Increased atelectasis in the dependent lower lobes. The heart is enlarged. Coronary artery calcifications. Hepatobiliary: Stable cysts in the liver. No new liver abnormality. Cholecystectomy. Stable biliary tree. Pancreas: Atrophic.  No ductal dilatation or inflammation. Spleen: Calcified granuloma.  No splenomegaly. Adrenals/Urinary Tract: No adrenal nodules. No hydronephrosis. Punctate stone in the lower pole of the right kidney. No ureteral stone. Physiologically distended urinary bladder with multiple diverticula. Stomach/Bowel: Colonic diverticulosis, extensive involving the sigmoid. No diverticulitis or acute colonic inflammation. Particularly, no evidence of splenic diverticulitis. No small bowel obstruction. There is a moderate hiatal hernia. Appendix not confidently seen. Vascular/Lymphatic: Advanced aortic and branch atherosclerosis. No portal venous or mesenteric gas. No adenopathy. Reproductive: Status post hysterectomy. No adnexal masses. Other: No free air or ascites. Laxity of the anterior abdominal wall. Small supraumbilical ventral abdominal wall hernia contains only fat. Musculoskeletal: Severe scoliosis and degenerative change in the  spine. Chronic T12 superior endplate compression deformity. IMPRESSION: 1. Colonic diverticulosis without diverticulitis. Specifically, no diverticulitis of the splenic flexure. 2. Moderate hiatal hernia. 3. Small bilateral pleural effusions, right greater than left. Increased atelectasis in the dependent lower lobes since August. 4. Cardiomegaly. 5. Punctate nonobstructing right renal stone. Aortic Atherosclerosis (ICD10-I70.0). Electronically Signed   By: Andrea Gasman M.D.   On: 05/05/2023 02:09   ECHOCARDIOGRAM COMPLETE Result Date: 05/04/2023    ECHOCARDIOGRAM REPORT   Patient Name:   Kimberly [name] Date of Exam: 05/04/2023 Medical Rec #:  [id number]     Height:        67.0 in Accession #:    [id number]    Weight:       125.0 lb Date of Birth:  February 28, 1927     BSA:          1.656 m Patient Age:    89 years      BP:           147/79 mmHg Patient Gender: F             HR:           60 bpm. Exam Location:  Inpatient Procedure: 2D Echo, Cardiac Doppler and Color Doppler Indications:    Congestive Heart Failure I50.9  History:        Patient has prior history of Echocardiogram examinations, most                 recent 11/21/2019. CHF and Cardiomyopathy; Arrythmias:Atrial                 Fibrillation.  Sonographer:    Jayson Gaskins Referring Phys: [id number] ROBERT DORRELL IMPRESSIONS  1. Left ventricular ejection fraction, by estimation, is 50 to 55%. The left ventricle has low normal function. The left ventricle has no regional wall motion abnormalities. Left ventricular diastolic parameters are indeterminate. Elevated left ventricular end-diastolic pressure.  2. Right ventricular systolic function is normal. The right ventricular size is normal.  3. Left atrial size was severely dilated.  4. Right atrial size was severely dilated.  5. The mitral valve is normal in structure. Mild mitral valve regurgitation. No evidence of mitral stenosis.  6. Tricuspid valve regurgitation is moderate.  7. The aortic valve is tricuspid. There is mild calcification of the aortic valve. There is mild thickening of the aortic valve. Aortic valve regurgitation is not visualized. Mild aortic valve stenosis. FINDINGS  Left Ventricle: Left ventricular ejection fraction, by estimation, is 50 to 55%. The left ventricle has low normal function. The left ventricle has no regional wall motion abnormalities. The left ventricular internal cavity size was normal in size. There is no left ventricular hypertrophy. Left ventricular diastolic parameters are indeterminate. Elevated left ventricular end-diastolic pressure. Right Ventricle: The right ventricular size is normal. No increase in right ventricular wall thickness.  Right ventricular systolic function is normal. Left Atrium: Left atrial size was severely dilated. Right Atrium: Right atrial size was severely dilated. Pericardium: There is no evidence of pericardial effusion. Mitral Valve: The mitral valve is normal in structure. Mild mitral valve regurgitation. No evidence of mitral valve stenosis. Tricuspid Valve: The tricuspid valve is normal in structure. Tricuspid valve regurgitation is moderate . No evidence of tricuspid stenosis. Aortic Valve: The aortic valve is tricuspid. There is mild calcification of the aortic valve. There is mild thickening of the aortic valve. Aortic valve regurgitation is not visualized. Mild aortic stenosis is present. Aortic valve mean gradient measures  4.0 mmHg. Aortic valve peak gradient measures 7.1 mmHg. Aortic valve area, by VTI measures 1.14 cm. Pulmonic Valve: The pulmonic valve was normal in structure. Pulmonic valve regurgitation is not visualized. No evidence of pulmonic stenosis. Aorta: The aortic root is normal in size and structure. IAS/Shunts: No atrial level shunt detected by color flow Doppler. Additional Comments: A device lead is visualized.  LEFT VENTRICLE PLAX 2D LVIDd:         4.30 cm   Diastology LVIDs:         3.10 cm   LV e' medial:    5.33 cm/s LV PW:         1.00 cm   LV E/e' medial:  18.7 LV IVS:        1.00 cm   LV e' lateral:   6.53 cm/s LVOT diam:     1.70 cm   LV E/e' lateral: 15.3 LV SV:         35 LV SV Index:   21 LVOT Area:     2.27 cm  LEFT ATRIUM             Index LA Vol (A2C):   71.3 ml 43.05 ml/m LA Vol (A4C):   76.0 ml 45.89 ml/m LA Biplane Vol: 74.9 ml 45.23 ml/m  AORTIC VALVE AV Area (Vmax):    1.49 cm AV Area (Vmean):   1.20 cm AV Area (VTI):     1.14 cm AV Vmax:           133.00 cm/s AV Vmean:          90.200 cm/s AV VTI:            0.307 m AV Peak Grad:      7.1 mmHg AV Mean Grad:      4.0 mmHg LVOT Vmax:         87.50 cm/s LVOT Vmean:        47.800 cm/s LVOT VTI:          0.154 m LVOT/AV VTI  ratio: 0.50  AORTA Ao Root diam: 3.20 cm MITRAL VALVE               TRICUSPID VALVE MV Area (PHT): 2.59 cm    TR Peak grad:   32.3 mmHg MV Decel Time: 293 msec    TR Vmax:        284.00 cm/s MV E velocity: 99.80 cm/s                            SHUNTS                            Systemic VTI:  0.15 m                            Systemic Diam: 1.70 cm Annabella Scarce MD Electronically signed by Annabella Scarce MD Signature Date/Time: 05/04/2023/5:01:17 PM    Final    DG Chest Port 1 View Result Date: 05/03/2023 CLINICAL DATA:  Dizziness and shortness of breath. Weeping edema both lower extremities. EXAM: PORTABLE CHEST 1 VIEW COMPARISON:  Portable chest 01/05/2023, 01/01/2023, 12/23/2022 FINDINGS: Left chest dual lead pacemaker, wiring and additional AID wire are unaltered. The heart is enlarged. There is increased central vascular prominence and flow cephalization, interval development of mild basilar and central interstitial edema, small pleural effusions. No  focal airspace disease is seen. Bilateral diaphragmatic eventrations are again noted. The aorta is tortuous and calcified with stable mediastinum. Chronic biapical pleural-parenchymal scarring and calcifications are again shown. No new osseous findings. Osteopenia and degenerative change of the spine. IMPRESSION: 1. Cardiomegaly with increased central vascular prominence and flow cephalization, interval development of mild basilar and central interstitial edema, small pleural effusions. Findings are consistent with CHF. 2. Aortic atherosclerosis with heavy calcification, uncoiling. Electronically Signed   By: Francis Quam M.D.   On: 05/03/2023 22:05        Scheduled Meds:  Chlorhexidine  Gluconate Cloth  6 each Topical Daily   vitamin B-12  1,000 mcg Oral Daily   doxycycline   100 mg Oral Q12H   furosemide   20 mg Intravenous Q12H   mirtazapine   15 mg Oral QHS   pantoprazole   40 mg Oral Daily   sertraline   50 mg Oral Daily   traZODone   50 mg  Oral QHS   Continuous Infusions:  albumin  human       LOS: 2 days    Time spent: 35 minutes     Ephrem Carrick A Danaysha Kirn, MD [facility]   If 7PM-7AM, please contact night-coverage [email or web address]  05/05/2023, 9:21 AM

## 2023-05-06 DIAGNOSIS — I4821 Permanent atrial fibrillation: Secondary | ICD-10-CM | POA: Diagnosis not present

## 2023-05-06 DIAGNOSIS — I5033 Acute on chronic diastolic (congestive) heart failure: Secondary | ICD-10-CM | POA: Diagnosis not present

## 2023-05-06 LAB — BASIC METABOLIC PANEL
Anion gap: 11 (ref 5–15)
BUN: 52 mg/dL — ABNORMAL HIGH (ref 8–23)
CO2: 26 mmol/L (ref 22–32)
Calcium: 8.3 mg/dL — ABNORMAL LOW (ref 8.9–10.3)
Chloride: 90 mmol/L — ABNORMAL LOW (ref 98–111)
Creatinine, Ser: 1.73 mg/dL — ABNORMAL HIGH (ref 0.44–1.00)
GFR, Estimated: 27 mL/min — ABNORMAL LOW (ref >60)
Glucose, Bld: 104 mg/dL — ABNORMAL HIGH (ref 70–99)
Potassium: 4.3 mmol/L (ref 3.5–5.1)
Sodium: 127 mmol/L — ABNORMAL LOW (ref 135–145)

## 2023-05-06 LAB — CBC
HCT: 25 % — ABNORMAL LOW (ref 36.0–46.0)
Hemoglobin: 7.9 g/dL — ABNORMAL LOW (ref 12.0–15.0)
MCH: 26.3 pg (ref 26.0–34.0)
MCHC: 31.6 g/dL (ref 30.0–36.0)
MCV: 83.3 fL (ref 80.0–100.0)
Platelets: 170 10*3/uL (ref 150–400)
RBC: 3 MIL/uL — ABNORMAL LOW (ref 3.87–5.11)
RDW: 18.6 % — ABNORMAL HIGH (ref 11.5–15.5)
WBC: 10.7 10*3/uL — ABNORMAL HIGH (ref 4.0–10.5)
nRBC: 0.2 % (ref 0.0–0.2)

## 2023-05-06 LAB — T3, FREE: T3, Free: 1.7 pg/mL — ABNORMAL LOW (ref 2.0–4.4)

## 2023-05-06 MED ORDER — POLYETHYLENE GLYCOL 3350 17 G PO PACK
17.0000 g | PACK | Freq: Two times a day (BID) | ORAL | Status: DC | PRN
  Administered 2023-05-09: 17 g via ORAL
  Filled 2023-05-06: qty 1

## 2023-05-06 NOTE — Progress Notes (Signed)
 [facility] Progress Note  SUBJECTIVE:   Interval history: Kimberly [name] was seen and evaluated today at bedside. Seated on commode at time of evaluation. Noted having some right sided abdominal pain. No nausea or vomiting, tolerated breakfast this AM. Had some chest pain as well as shortness of breath. Shortness of breath is chronic. Remains on supplemental oxygen  2 liters nasal cannula, O2 saturation 92%. Per IM documentation, palliative care being involved for goals of care discussion.   Past Medical History:  Diagnosis Date   Acute blood loss anemia 10/11/2012   Acute diverticulitis 08/24/2013   Acute on chronic combined systolic and diastolic CHF, NYHA class 4 (HCC) 11/15/2013   Antral ulcer 10/11/2012   Aortic atherosclerosis (HCC)    Atrial fibrillation (HCC)    Bipolar affective disorder (HCC) 03/23/2017   Cardiomyopathy, nonischemic (HCC)    Chronic anticoagulation 10/12/2012   CKD (chronic kidney disease) stage 3, GFR 30-59 ml/min (HCC) 10/12/2012   Depression    Diverticulosis    DVT (deep venous thrombosis) (HCC)    left peroneal veins/IVC Filter placed as per hospital discharge 10/24/21   Erosive esophagitis 10/11/2012   Esophagitis    Fibromyalgia    Glaucoma    H/O echocardiogram 2007   EF 40-45%,          Hiatal hernia    History of anemia due to CKD    Hypertension    Internal hemorrhoids    Nephrolithiasis    Osteoarthritis    Pacemaker    Last saw cards 07/2013   Prepyloric ulcer    Scoliosis    Tubular adenoma of colon    Past Surgical History:  Procedure Laterality Date   ABDOMINAL HYSTERECTOMY     APPENDECTOMY     BACK SURGERY     BIOPSY  03/03/2017   Procedure: BIOPSY;  Surgeon: Harvey Margo LITTIE, MD;  Location: [facility];  Service: Endoscopy;;  gastric   BREAST SURGERY     CARDIAC CATHETERIZATION  12/08/2005   LAD AND LEFT MAIN WITH NO HIGH-GRADE STENOSIS. MILD DISEASE IN THE CX AND LAD SYSTEM. SEVERE LV DYSFUNCTION WITH DILATION OF  THE LV. EF 15-20%. LV END-DIASTOLIC PRESSURE IS 90. +1 MR.   CHOLECYSTECTOMY     COLONOSCOPY N/A 03/03/2017   Dr. Harvey: 5 colon polyps removed, adenomatous.  Diverticulosis.  99% of the colon was cleared but the cecum was not adequately seen.   CYSTOSCOPY N/A 02/24/2013   Procedure: CYSTOSCOPY WITH URETHRAL DILITATION;  Surgeon: Mohammad I Javaid, MD;  Location: [facility];  Service: Urology;  Laterality: N/A;   DOPPLER ECHOCARDIOGRAPHY N/A 05/30/2010   LV SIZE IS NORMAL. LV SYSTOLIC FUNCTION IS LOW NORMAL. EF=50-55%. MILD INFERIOR HYPOKINESIS.MILD TO MODERATE POSTERIOR WALL HYPOKINESIS.PACEMAKER LEAD IN THE RV. LA IS MILDLY DILATED. RA IS MODERATE TO SEVERLY DILATED. PACEMAKER LEAD IN THE RA. MILD CALCICICATION OF THE MV APPARATUS. MODERATE MR. MILD TO MODERATE TR. MILD PHTN.AV MILDLY SCLEROTIC.   ESOPHAGOGASTRODUODENOSCOPY N/A 10/13/2012   Dr. Shaaron: severe ulcerative reflux esophagitis, question of Barrett's but negative path, single deep prepyloric antral ulcer, negative H.pylori   ESOPHAGOGASTRODUODENOSCOPY N/A 03/03/2017   Dr. Harvey: Gastritis/duodenitis, no H. pylori   ESOPHAGOGASTRODUODENOSCOPY (EGD) WITH PROPOFOL  N/A 10/01/2021   Procedure: ESOPHAGOGASTRODUODENOSCOPY (EGD) WITH PROPOFOL ;  Surgeon: Albertus Gordy HERO, MD;  Location: [facility] ENDOSCOPY;  Service: Gastroenterology;  Laterality: N/A;   HERNIA REPAIR     right inguinal hernia and umbilical   LOWETR EXT VENOUS Bilateral 11-08-10   R & L-  NO EVIDENCE OF THROMBUS OR THROMBOPHLEBITIS. THERE IS MILD AMOUNT OF SUBCUTANEOUS EDEMA NOTED WITHIN THE LEFT CALF AND ANKLE. R & L GSV AND SSV- NO VENOUS INSUFF NOTED.   NECK SURGERY     NUCLEAR STRESS TEST N/A 02/13/2009   NORMAL PATTERN OF PERFUSION IN ALL REGIONS. POST STRESS VENTICULAR SIZE IS NORMAL. POST  STESS EF 85%.  NORMAL MYOCARDIAL PERFUSION STUDY.   OPEN REDUCTION INTERNAL FIXATION (ORIF) DISTAL PHALANX Left 11/16/2018   Procedure: MIDDLE FINGER OPEN REDUCTION VERSUS RECONSTRUCTION;  Surgeon:  Camella Fallow, MD;  Location: [facility] OR;  Service: Orthopedics;  Laterality: Left;   PACEMAKER INSERTION     POLYPECTOMY  03/03/2017   Procedure: POLYPECTOMY;  Surgeon: Harvey Margo CROME, MD;  Location: [facility];  Service: Endoscopy;;  colon   PPM GENERATOR CHANGEOUT N/A 06/18/2020   Procedure: PPM GENERATOR CHANGEOUT;  Surgeon: Francyne Headland, MD;  Location: [facility] INVASIVE CV LAB;  Service: Cardiovascular;  Laterality: N/A;   TONSILLECTOMY     YAG LASER APPLICATION Bilateral 11/20/2014   Procedure: YAG LASER APPLICATION;  Surgeon: Dow JULIANNA Burke, MD;  Location: [facility];  Service: Ophthalmology;  Laterality: Bilateral;   Current Facility-Administered Medications  Medication Dose Route Frequency Provider Last Rate Last Admin   acetaminophen  (TYLENOL ) tablet 650 mg  650 mg Oral Q6H PRN Dena Charleston, MD   650 mg at 05/05/23 2133   Or   acetaminophen  (TYLENOL ) suppository 650 mg  650 mg Rectal Q6H PRN Dena Charleston, MD       Chlorhexidine  Gluconate Cloth 2 % PADS 6 each  6 each Topical Daily Regalado, Belkys A, MD   6 each at 05/05/23 2112   cyanocobalamin  (VITAMIN B12) tablet 1,000 mcg  1,000 mcg Oral Daily Regalado, Belkys A, MD   1,000 mcg at 05/06/23 1027   doxycycline  (VIBRA -TABS) tablet 100 mg  100 mg Oral Q12H Regalado, Belkys A, MD   100 mg at 05/06/23 1027   mirtazapine  (REMERON ) tablet 15 mg  15 mg Oral QHS Dorrell, Robert, MD   15 mg at 05/05/23 2134   ondansetron  (ZOFRAN ) tablet 4 mg  4 mg Oral Q6H PRN Dena Charleston, MD       Or   ondansetron  (ZOFRAN ) injection 4 mg  4 mg Intravenous Q6H PRN Dena Charleston, MD   4 mg at 05/05/23 2133   Oral care mouth rinse  15 mL Mouth Rinse PRN Regalado, Belkys A, MD       oxyCODONE  (Oxy IR/ROXICODONE ) immediate release tablet 5 mg  5 mg Oral Q4H PRN Dena Charleston, MD   5 mg at 05/06/23 0435   pantoprazole  (PROTONIX ) EC tablet 40 mg  40 mg Oral Daily Dorrell, Charleston, MD   40 mg at 05/06/23 1027   sertraline  (ZOLOFT ) tablet 50 mg  50 mg Oral  Daily Dorrell, Charleston, MD   50 mg at 05/06/23 1027   traZODone  (DESYREL ) tablet 50 mg  50 mg Oral QHS Dorrell, Robert, MD   50 mg at 05/05/23 2134   Allergies as of 05/03/2023 - Review Complete 05/03/2023  Allergen Reaction Noted   Penicillins Hives 01/22/2011   Ceftriaxone  Itching 01/08/2023   Camphor Other (See Comments) 05/03/2023   Ciprofloxacin  Diarrhea and Other (See Comments) 04/10/2014   Flagyl  [metronidazole ] Other (See Comments) 03/21/2017   Papaya derivatives Hives 01/22/2011   Iodine Rash and Other (See Comments) 01/22/2011   Sulfa antibiotics Rash 01/22/2011   Review of Systems:  Review of Systems  Respiratory:  Positive for shortness of  breath.   Cardiovascular:  Positive for chest pain.  Gastrointestinal:  Positive for abdominal pain (right abdomen) and constipation. Negative for nausea and vomiting.  Musculoskeletal:        Bilateral lower extremity pain    OBJECTIVE:   Temp:  [95 F (35 C)-97.7 F (36.5 C)] 97.4 F (36.3 C) (01/01 0400) Pulse Rate:  [59-65] 60 (01/01 0900) Resp:  [12-29] 14 (01/01 0900) BP: (98-162)/(41-117) 116/55 (01/01 0900) SpO2:  [88 %-100 %] 100 % (01/01 0900) Last BM Date : 05/04/23 Physical Exam Constitutional:      General: She is not in acute distress.    Appearance: She is underweight. She is not ill-appearing, toxic-appearing or diaphoretic.  Cardiovascular:     Rate and Rhythm: Normal rate and regular rhythm.  Pulmonary:     Effort: No respiratory distress.     Breath sounds: Normal breath sounds.     Comments: Supplemental oxygen  nasal cannula 2 liters Abdominal:     General: Bowel sounds are normal. There is no distension.     Palpations: Abdomen is soft.     Tenderness: There is abdominal tenderness. There is no guarding.  Musculoskeletal:     Right lower leg: No edema.     Left lower leg: No edema.  Skin:    General: Skin is warm and dry.  Neurological:     Mental Status: She is alert.     Labs: Recent Labs     05/04/23 0705 05/05/23 0930 05/06/23 0448  WBC 4.9 4.2 10.7*  HGB 7.9* 8.2* 7.9*  HCT 24.3* 26.0* 25.0*  PLT 177 172 170   BMET Recent Labs    05/04/23 0705 05/05/23 0803 05/06/23 0328  NA 127* 126* 127*  K 4.0 3.7 4.3  CL 90* 91* 90*  CO2 28 27 26   GLUCOSE 103* 89 104*  BUN 42* 46* 52*  CREATININE 1.29* 1.21* 1.73*  CALCIUM  8.6* 8.1* 8.3*   LFT No results for input(s): PROT, ALBUMIN , AST, ALT, ALKPHOS, BILITOT, BILIDIR, IBILI in the last 72 hours. PT/INR No results for input(s): LABPROT, INR in the last 72 hours. Diagnostic imaging: CT ABDOMEN PELVIS WO CONTRAST Result Date: 05/05/2023 CLINICAL DATA:  Anemia. Fall a possible splenic flexure diverticulitis. EXAM: CT ABDOMEN AND PELVIS WITHOUT CONTRAST TECHNIQUE: Multidetector CT imaging of the abdomen and pelvis was performed following the standard protocol without IV contrast. RADIATION DOSE REDUCTION: This exam was performed according to the departmental dose-optimization program which includes automated exposure control, adjustment of the mA and/or kV according to patient size and/or use of iterative reconstruction technique. COMPARISON:  CT 12/23/2022 FINDINGS: Lower chest: Small bilateral pleural effusions, right greater than left. Increased atelectasis in the dependent lower lobes. The heart is enlarged. Coronary artery calcifications. Hepatobiliary: Stable cysts in the liver. No new liver abnormality. Cholecystectomy. Stable biliary tree. Pancreas: Atrophic.  No ductal dilatation or inflammation. Spleen: Calcified granuloma.  No splenomegaly. Adrenals/Urinary Tract: No adrenal nodules. No hydronephrosis. Punctate stone in the lower pole of the right kidney. No ureteral stone. Physiologically distended urinary bladder with multiple diverticula. Stomach/Bowel: Colonic diverticulosis, extensive involving the sigmoid. No diverticulitis or acute colonic inflammation. Particularly, no evidence of splenic  diverticulitis. No small bowel obstruction. There is a moderate hiatal hernia. Appendix not confidently seen. Vascular/Lymphatic: Advanced aortic and branch atherosclerosis. No portal venous or mesenteric gas. No adenopathy. Reproductive: Status post hysterectomy. No adnexal masses. Other: No free air or ascites. Laxity of the anterior abdominal wall. Small supraumbilical ventral abdominal wall hernia  contains only fat. Musculoskeletal: Severe scoliosis and degenerative change in the spine. Chronic T12 superior endplate compression deformity. IMPRESSION: 1. Colonic diverticulosis without diverticulitis. Specifically, no diverticulitis of the splenic flexure. 2. Moderate hiatal hernia. 3. Small bilateral pleural effusions, right greater than left. Increased atelectasis in the dependent lower lobes since August. 4. Cardiomegaly. 5. Punctate nonobstructing right renal stone. Aortic Atherosclerosis (ICD10-I70.0). Electronically Signed   By: Andrea Gasman M.D.   On: 05/05/2023 02:09   ECHOCARDIOGRAM COMPLETE Result Date: 05/04/2023    ECHOCARDIOGRAM REPORT   Patient Name:   AYDEN APODACA Date of Exam: 05/04/2023 Medical Rec #:  [id number]     Height:       67.0 in Accession #:    [id number]    Weight:       125.0 lb Date of Birth:  Jan 15, 1927     BSA:          1.656 m Patient Age:    89 years      BP:           147/79 mmHg Patient Gender: F             HR:           60 bpm. Exam Location:  Inpatient Procedure: 2D Echo, Cardiac Doppler and Color Doppler Indications:    Congestive Heart Failure I50.9  History:        Patient has prior history of Echocardiogram examinations, most                 recent 11/21/2019. CHF and Cardiomyopathy; Arrythmias:Atrial                 Fibrillation.  Sonographer:    Jayson Gaskins Referring Phys: [id number] ROBERT DORRELL IMPRESSIONS  1. Left ventricular ejection fraction, by estimation, is 50 to 55%. The left ventricle has low normal function. The left ventricle has no regional wall  motion abnormalities. Left ventricular diastolic parameters are indeterminate. Elevated left ventricular end-diastolic pressure.  2. Right ventricular systolic function is normal. The right ventricular size is normal.  3. Left atrial size was severely dilated.  4. Right atrial size was severely dilated.  5. The mitral valve is normal in structure. Mild mitral valve regurgitation. No evidence of mitral stenosis.  6. Tricuspid valve regurgitation is moderate.  7. The aortic valve is tricuspid. There is mild calcification of the aortic valve. There is mild thickening of the aortic valve. Aortic valve regurgitation is not visualized. Mild aortic valve stenosis. FINDINGS  Left Ventricle: Left ventricular ejection fraction, by estimation, is 50 to 55%. The left ventricle has low normal function. The left ventricle has no regional wall motion abnormalities. The left ventricular internal cavity size was normal in size. There is no left ventricular hypertrophy. Left ventricular diastolic parameters are indeterminate. Elevated left ventricular end-diastolic pressure. Right Ventricle: The right ventricular size is normal. No increase in right ventricular wall thickness. Right ventricular systolic function is normal. Left Atrium: Left atrial size was severely dilated. Right Atrium: Right atrial size was severely dilated. Pericardium: There is no evidence of pericardial effusion. Mitral Valve: The mitral valve is normal in structure. Mild mitral valve regurgitation. No evidence of mitral valve stenosis. Tricuspid Valve: The tricuspid valve is normal in structure. Tricuspid valve regurgitation is moderate . No evidence of tricuspid stenosis. Aortic Valve: The aortic valve is tricuspid. There is mild calcification of the aortic valve. There is mild thickening of the aortic valve. Aortic valve regurgitation is not visualized.  Mild aortic stenosis is present. Aortic valve mean gradient measures  4.0 mmHg. Aortic valve peak gradient  measures 7.1 mmHg. Aortic valve area, by VTI measures 1.14 cm. Pulmonic Valve: The pulmonic valve was normal in structure. Pulmonic valve regurgitation is not visualized. No evidence of pulmonic stenosis. Aorta: The aortic root is normal in size and structure. IAS/Shunts: No atrial level shunt detected by color flow Doppler. Additional Comments: A device lead is visualized.  LEFT VENTRICLE PLAX 2D LVIDd:         4.30 cm   Diastology LVIDs:         3.10 cm   LV e' medial:    5.33 cm/s LV PW:         1.00 cm   LV E/e' medial:  18.7 LV IVS:        1.00 cm   LV e' lateral:   6.53 cm/s LVOT diam:     1.70 cm   LV E/e' lateral: 15.3 LV SV:         35 LV SV Index:   21 LVOT Area:     2.27 cm  LEFT ATRIUM             Index LA Vol (A2C):   71.3 ml 43.05 ml/m LA Vol (A4C):   76.0 ml 45.89 ml/m LA Biplane Vol: 74.9 ml 45.23 ml/m  AORTIC VALVE AV Area (Vmax):    1.49 cm AV Area (Vmean):   1.20 cm AV Area (VTI):     1.14 cm AV Vmax:           133.00 cm/s AV Vmean:          90.200 cm/s AV VTI:            0.307 m AV Peak Grad:      7.1 mmHg AV Mean Grad:      4.0 mmHg LVOT Vmax:         87.50 cm/s LVOT Vmean:        47.800 cm/s LVOT VTI:          0.154 m LVOT/AV VTI ratio: 0.50  AORTA Ao Root diam: 3.20 cm MITRAL VALVE               TRICUSPID VALVE MV Area (PHT): 2.59 cm    TR Peak grad:   32.3 mmHg MV Decel Time: 293 msec    TR Vmax:        284.00 cm/s MV E velocity: 99.80 cm/s                            SHUNTS                            Systemic VTI:  0.15 m                            Systemic Diam: 1.70 cm Annabella Scarce MD Electronically signed by Annabella Scarce MD Signature Date/Time: 05/04/2023/5:01:17 PM    Final    IMPRESSION: Normocytic anemia Fecal occult positive stool Personal history colon polyps Diverticulosis, rectosigmoid, sigmoid Congestive heart failure exacerbation Atrial fibrillation, on Eliquis  prior to admission  Chronic kidney disease History diverticulitis   PLAN: -No sign of  active GI blood loss at this time -Does not appear to be an appropriate candidate for endoscopic evaluation given medical co morbidities  -  Goals of care discussion will be ongoing with Palliative care, this appears appropriate -Could consider video capsule endoscopy if wishing to pursue aggressive measures  -Ok to resume anticoagulant from GI perspective -[facility] will be available as needed if aggressive measures pursued    LOS: 3 days   Estefana Keas, [facility]

## 2023-05-06 NOTE — Progress Notes (Signed)
 PROGRESS NOTE    TERYN [name]  FMW:[id number] DOB: 05-11-1926 DOA: 05/03/2023 PCP: Renato Dorothey HERO, NP   Brief Narrative: 89 year old with past medical history significant for CHF, A-fib, bipolar, hypertension presented to the emergency department with dizziness, shortness of breath, leg swelling.  She has been weeping from her bilateral lower extremity, reports weight gain.  Due to worsening discomfort in her legs she presents to the ED.  She was found to have a sodium of 127, creatinine 1.2, hemoglobin 8.1, BNP 598.  Chest x-ray showed cardiomegaly with increased vascular prominence concerning for volume overload.  Patient received IV Lasix  in the ED. admitted for further evaluation   Assessment & Plan:   Principal Problem:   CHF (congestive heart failure) (HCC) Active Problems:   Acute on chronic diastolic CHF (congestive heart failure) (HCC)   1-Acute on Chronic Diastolic Heart Failure Exacerbation;  -Present with dyspnea, weight gain, lower extremity edema, chest x-ray with increased vascular prominence.  BNP elevated at 598. -Daily weight -Strict I and O. Negative 995 -ECHO : Ef 50%, indeterminate diastolic Dysfunction.  Cr increase to 1.7, hold lasix  today.   2-Anemia,  occult blood positive Presents with anemia, occult blood positive.  Held Eliquis . Discussed with GI and cardiology.  GI was consulted. BP labile for endoscopy. Could consider Capsule endoscopy.  Anemia panel. Started  Iron  and B 12.  CT abdomen pelvis. Negative for Mass Hb down to 7.8 monitor. Holding Eliquis .   3-AKI CKD 3B Monitor Cr, stable.  Cr increase to 1.7. hold lasix .   Hypertension: -BP low yesterday. Holding coreg .   Hypotension; concern for low cardiac out put.  Cardiology following.  Received Albumin .   Insomnia: On Remeron .   Paroxysmal A-fib: Hold eliquis . Continue with coreg .   GERD: PPI  Depression: Continue with Zoloft .   Hyponatremia:   TSH 5, mildly elevated.  Checking free t3 an d T4, Cortisol. 12 normal.  Suspect Hypervolemia form HF>   Hypothermia. TSH. Mildly elevated.  No Leukocytosis, x ray finding consistent with HF.  UA negative for infection.   Chronic LE edema and  redness, venous stasis.  On Doxy, has allergies to Cephalosporin.  She  was on Eliquis  prior to admission    Estimated body mass index is 19.58 kg/m as calculated from the following:   Height as of this encounter: 5' 7 (1.702 m).   Weight as of this encounter: 56.7 kg.   DVT prophylaxis: SCD Code Status: discussed with patient, wishes to be DNR> discussed with Daughter  Family Communication: Daughter updated 12/30 12/31. Plan to consult palliative care for goals of care.  Disposition Plan:  Status is: Inpatient Remains inpatient appropriate because: management of HF    Consultants:  Cardiology   Procedures:  ECHO  Antimicrobials:    Subjective: She is sleepy, keep eyes close, but says, I a  listening. Denies pain. She feels cold. I told her I was concern about her health, kidney function got affected, we are going to hold lasix . Will minitor her breathing. I informed her Palliative care has been consulted, to talk about proceeding with a more comfort care approach. She said that is ok. She asked for 1 Million dollars, she would have a balll with it,a blast. She would dress nicely.   Objective: Vitals:   05/06/23 0300 05/06/23 0400 05/06/23 0435 05/06/23 0700  BP:  127/71    Pulse:  65 60 62  Resp: (!) 22 12 14 13   Temp:  (!) 97.4 F (  36.3 C)    TempSrc:  Oral    SpO2:  100% 100% 100%  Weight:      Height:        Intake/Output Summary (Last 24 hours) at 05/06/2023 0827 Last data filed at 05/06/2023 0500 Gross per 24 hour  Intake 720 ml  Output 950 ml  Net -230 ml   Filed Weights   05/03/23 1753  Weight: 56.7 kg    Examination:  General exam: Frail, NAD Respiratory system: BL crackles.  Cardiovascular system: S 1, S 2 RRR positive  Murmur.  Gastrointestinal system: BS present, soft, nt Central nervous system: alert Extremities: redness less pronounced, BL edema   Data Reviewed: I have personally reviewed following labs and imaging studies  CBC: Recent Labs  Lab 05/03/23 1850 05/04/23 0705 05/05/23 0930 05/06/23 0448  WBC 5.7 4.9 4.2 10.7*  HGB 8.1* 7.9* 8.2* 7.9*  HCT 25.6* 24.3* 26.0* 25.0*  MCV 83.9 82.7 83.9 83.3  PLT 202 177 172 170   Basic Metabolic Panel: Recent Labs  Lab 05/03/23 1850 05/04/23 0705 05/05/23 0803 05/05/23 0930 05/06/23 0328  NA 127* 127* 126*  --  127*  K 4.8 4.0 3.7  --  4.3  CL 93* 90* 91*  --  90*  CO2 25 28 27   --  26  GLUCOSE 93 103* 89  --  104*  BUN 40* 42* 46*  --  52*  CREATININE 1.22* 1.29* 1.21*  --  1.73*  CALCIUM  8.7* 8.6* 8.1*  --  8.3*  MG  --   --   --  2.1  --    GFR: Estimated Creatinine Clearance: 17.8 mL/min (A) (by C-G formula based on SCr of 1.73 mg/dL (H)). Liver Function Tests: No results for input(s): AST, ALT, ALKPHOS, BILITOT, PROT, ALBUMIN  in the last 168 hours. No results for input(s): LIPASE, AMYLASE in the last 168 hours. No results for input(s): AMMONIA in the last 168 hours. Coagulation Profile: No results for input(s): INR, PROTIME in the last 168 hours. Cardiac Enzymes: No results for input(s): CKTOTAL, CKMB, CKMBINDEX, TROPONINI in the last 168 hours. BNP (last 3 results) No results for input(s): PROBNP in the last 8760 hours. HbA1C: No results for input(s): HGBA1C in the last 72 hours. CBG: Recent Labs  Lab 05/03/23 1905  GLUCAP 94   Lipid Profile: No results for input(s): CHOL, HDL, LDLCALC, TRIG, CHOLHDL, LDLDIRECT in the last 72 hours. Thyroid  Function Tests: Recent Labs    05/04/23 1108 05/05/23 0307  TSH 5.234*  --   FREET4  --  0.91  T3FREE  --  1.7*   Anemia Panel: Recent Labs    05/04/23 1108  VITAMINB12 288  FOLATE 11.7  FERRITIN 36  TIBC 484*  IRON  28   RETICCTPCT 1.7   Sepsis Labs: Recent Labs  Lab 05/05/23 1343  LATICACIDVEN 1.5    Recent Results (from the past 240 hours)  Resp panel by RT-PCR (RSV, Flu A&B, Covid) Anterior Nasal Swab     Status: None   Collection Time: 05/03/23  6:41 PM   Specimen: Anterior Nasal Swab  Result Value Ref Range Status   SARS Coronavirus 2 by RT PCR NEGATIVE NEGATIVE Final    Comment: (NOTE) SARS-CoV-2 target nucleic acids are NOT DETECTED.  The SARS-CoV-2 RNA is generally detectable in upper respiratory specimens during the acute phase of infection. The lowest concentration of SARS-CoV-2 viral copies this assay can detect is 138 copies/mL. A negative result does not preclude SARS-Cov-2 infection  and should not be used as the sole basis for treatment or other patient management decisions. A negative result may occur with  improper specimen collection/handling, submission of specimen other than nasopharyngeal swab, presence of viral mutation(s) within the areas targeted by this assay, and inadequate number of viral copies(<138 copies/mL). A negative result must be combined with clinical observations, patient history, and epidemiological information. The expected result is Negative.  Fact Sheet for Patients:  bloggercourse.com  Fact Sheet for Healthcare Providers:  seriousbroker.it  This test is no t yet approved or cleared by the [place]  FDA and  has been authorized for detection and/or diagnosis of SARS-CoV-2 by FDA under an Emergency Use Authorization (EUA). This EUA will remain  in effect (meaning this test can be used) for the duration of the COVID-19 declaration under Section 564(b)(1) of the Act, 21 U.S.C.section 360bbb-3(b)(1), unless the authorization is terminated  or revoked sooner.       Influenza A by PCR NEGATIVE NEGATIVE Final   Influenza B by PCR NEGATIVE NEGATIVE Final    Comment: (NOTE) The Xpert Xpress  SARS-CoV-2/FLU/RSV plus assay is intended as an aid in the diagnosis of influenza from Nasopharyngeal swab specimens and should not be used as a sole basis for treatment. Nasal washings and aspirates are unacceptable for Xpert Xpress SARS-CoV-2/FLU/RSV testing.  Fact Sheet for Patients: bloggercourse.com  Fact Sheet for Healthcare Providers: seriousbroker.it  This test is not yet approved or cleared by the [place]  FDA and has been authorized for detection and/or diagnosis of SARS-CoV-2 by FDA under an Emergency Use Authorization (EUA). This EUA will remain in effect (meaning this test can be used) for the duration of the COVID-19 declaration under Section 564(b)(1) of the Act, 21 U.S.C. section 360bbb-3(b)(1), unless the authorization is terminated or revoked.     Resp Syncytial Virus by PCR NEGATIVE NEGATIVE Final    Comment: (NOTE) Fact Sheet for Patients: bloggercourse.com  Fact Sheet for Healthcare Providers: seriousbroker.it  This test is not yet approved or cleared by the [place]  FDA and has been authorized for detection and/or diagnosis of SARS-CoV-2 by FDA under an Emergency Use Authorization (EUA). This EUA will remain in effect (meaning this test can be used) for the duration of the COVID-19 declaration under Section 564(b)(1) of the Act, 21 U.S.C. section 360bbb-3(b)(1), unless the authorization is terminated or revoked.  Performed at [facility], [address], [place], [place] [address]   MRSA Next Gen by PCR, Nasal     Status: None   Collection Time: 05/04/23  4:00 PM   Specimen: Nasal Mucosa; Nasal Swab  Result Value Ref Range Status   MRSA by PCR Next Gen NOT DETECTED NOT DETECTED Final    Comment: (NOTE) The GeneXpert MRSA Assay (FDA approved for NASAL specimens only), is one component of a comprehensive MRSA colonization  surveillance program. It is not intended to diagnose MRSA infection nor to guide or monitor treatment for MRSA infections. Test performance is not FDA approved in patients less than 78 years old. Performed at [facility], [address], [place], [place] [address]          Radiology Studies: CT ABDOMEN PELVIS WO CONTRAST Result Date: 05/05/2023 CLINICAL DATA:  Anemia. Fall a possible splenic flexure diverticulitis. EXAM: CT ABDOMEN AND PELVIS WITHOUT CONTRAST TECHNIQUE: Multidetector CT imaging of the abdomen and pelvis was performed following the standard protocol without IV contrast. RADIATION DOSE REDUCTION: This exam was performed according to the departmental  dose-optimization program which includes automated exposure control, adjustment of the mA and/or kV according to patient size and/or use of iterative reconstruction technique. COMPARISON:  CT 12/23/2022 FINDINGS: Lower chest: Small bilateral pleural effusions, right greater than left. Increased atelectasis in the dependent lower lobes. The heart is enlarged. Coronary artery calcifications. Hepatobiliary: Stable cysts in the liver. No new liver abnormality. Cholecystectomy. Stable biliary tree. Pancreas: Atrophic.  No ductal dilatation or inflammation. Spleen: Calcified granuloma.  No splenomegaly. Adrenals/Urinary Tract: No adrenal nodules. No hydronephrosis. Punctate stone in the lower pole of the right kidney. No ureteral stone. Physiologically distended urinary bladder with multiple diverticula. Stomach/Bowel: Colonic diverticulosis, extensive involving the sigmoid. No diverticulitis or acute colonic inflammation. Particularly, no evidence of splenic diverticulitis. No small bowel obstruction. There is a moderate hiatal hernia. Appendix not confidently seen. Vascular/Lymphatic: Advanced aortic and branch atherosclerosis. No portal venous or mesenteric gas. No adenopathy. Reproductive: Status post hysterectomy. No  adnexal masses. Other: No free air or ascites. Laxity of the anterior abdominal wall. Small supraumbilical ventral abdominal wall hernia contains only fat. Musculoskeletal: Severe scoliosis and degenerative change in the spine. Chronic T12 superior endplate compression deformity. IMPRESSION: 1. Colonic diverticulosis without diverticulitis. Specifically, no diverticulitis of the splenic flexure. 2. Moderate hiatal hernia. 3. Small bilateral pleural effusions, right greater than left. Increased atelectasis in the dependent lower lobes since August. 4. Cardiomegaly. 5. Punctate nonobstructing right renal stone. Aortic Atherosclerosis (ICD10-I70.0). Electronically Signed   By: Andrea Gasman M.D.   On: 05/05/2023 02:09   ECHOCARDIOGRAM COMPLETE Result Date: 05/04/2023    ECHOCARDIOGRAM REPORT   Patient Name:   Kimberly [name] Date of Exam: 05/04/2023 Medical Rec #:  [id number]     Height:       67.0 in Accession #:    [id number]    Weight:       125.0 lb Date of Birth:  1926/08/04     BSA:          1.656 m Patient Age:    89 years      BP:           147/79 mmHg Patient Gender: F             HR:           60 bpm. Exam Location:  Inpatient Procedure: 2D Echo, Cardiac Doppler and Color Doppler Indications:    Congestive Heart Failure I50.9  History:        Patient has prior history of Echocardiogram examinations, most                 recent 11/21/2019. CHF and Cardiomyopathy; Arrythmias:Atrial                 Fibrillation.  Sonographer:    Jayson Gaskins Referring Phys: [id number] ROBERT DORRELL IMPRESSIONS  1. Left ventricular ejection fraction, by estimation, is 50 to 55%. The left ventricle has low normal function. The left ventricle has no regional wall motion abnormalities. Left ventricular diastolic parameters are indeterminate. Elevated left ventricular end-diastolic pressure.  2. Right ventricular systolic function is normal. The right ventricular size is normal.  3. Left atrial size was severely dilated.  4. Right  atrial size was severely dilated.  5. The mitral valve is normal in structure. Mild mitral valve regurgitation. No evidence of mitral stenosis.  6. Tricuspid valve regurgitation is moderate.  7. The aortic valve is tricuspid. There is mild calcification of the aortic valve. There is mild thickening of the aortic valve.  Aortic valve regurgitation is not visualized. Mild aortic valve stenosis. FINDINGS  Left Ventricle: Left ventricular ejection fraction, by estimation, is 50 to 55%. The left ventricle has low normal function. The left ventricle has no regional wall motion abnormalities. The left ventricular internal cavity size was normal in size. There is no left ventricular hypertrophy. Left ventricular diastolic parameters are indeterminate. Elevated left ventricular end-diastolic pressure. Right Ventricle: The right ventricular size is normal. No increase in right ventricular wall thickness. Right ventricular systolic function is normal. Left Atrium: Left atrial size was severely dilated. Right Atrium: Right atrial size was severely dilated. Pericardium: There is no evidence of pericardial effusion. Mitral Valve: The mitral valve is normal in structure. Mild mitral valve regurgitation. No evidence of mitral valve stenosis. Tricuspid Valve: The tricuspid valve is normal in structure. Tricuspid valve regurgitation is moderate . No evidence of tricuspid stenosis. Aortic Valve: The aortic valve is tricuspid. There is mild calcification of the aortic valve. There is mild thickening of the aortic valve. Aortic valve regurgitation is not visualized. Mild aortic stenosis is present. Aortic valve mean gradient measures  4.0 mmHg. Aortic valve peak gradient measures 7.1 mmHg. Aortic valve area, by VTI measures 1.14 cm. Pulmonic Valve: The pulmonic valve was normal in structure. Pulmonic valve regurgitation is not visualized. No evidence of pulmonic stenosis. Aorta: The aortic root is normal in size and structure.  IAS/Shunts: No atrial level shunt detected by color flow Doppler. Additional Comments: A device lead is visualized.  LEFT VENTRICLE PLAX 2D LVIDd:         4.30 cm   Diastology LVIDs:         3.10 cm   LV e' medial:    5.33 cm/s LV PW:         1.00 cm   LV E/e' medial:  18.7 LV IVS:        1.00 cm   LV e' lateral:   6.53 cm/s LVOT diam:     1.70 cm   LV E/e' lateral: 15.3 LV SV:         35 LV SV Index:   21 LVOT Area:     2.27 cm  LEFT ATRIUM             Index LA Vol (A2C):   71.3 ml 43.05 ml/m LA Vol (A4C):   76.0 ml 45.89 ml/m LA Biplane Vol: 74.9 ml 45.23 ml/m  AORTIC VALVE AV Area (Vmax):    1.49 cm AV Area (Vmean):   1.20 cm AV Area (VTI):     1.14 cm AV Vmax:           133.00 cm/s AV Vmean:          90.200 cm/s AV VTI:            0.307 m AV Peak Grad:      7.1 mmHg AV Mean Grad:      4.0 mmHg LVOT Vmax:         87.50 cm/s LVOT Vmean:        47.800 cm/s LVOT VTI:          0.154 m LVOT/AV VTI ratio: 0.50  AORTA Ao Root diam: 3.20 cm MITRAL VALVE               TRICUSPID VALVE MV Area (PHT): 2.59 cm    TR Peak grad:   32.3 mmHg MV Decel Time: 293 msec    TR Vmax:  284.00 cm/s MV E velocity: 99.80 cm/s                            SHUNTS                            Systemic VTI:  0.15 m                            Systemic Diam: 1.70 cm Annabella Scarce MD Electronically signed by Annabella Scarce MD Signature Date/Time: 05/04/2023/5:01:17 PM    Final         Scheduled Meds:  Chlorhexidine  Gluconate Cloth  6 each Topical Daily   vitamin B-12  1,000 mcg Oral Daily   doxycycline   100 mg Oral Q12H   furosemide   60 mg Intravenous BID   mirtazapine   15 mg Oral QHS   pantoprazole   40 mg Oral Daily   sertraline   50 mg Oral Daily   traZODone   50 mg Oral QHS   Continuous Infusions:     LOS: 3 days    Time spent: 35 minutes     Marika Mahaffy A Sherline Eberwein, MD [facility]   If 7PM-7AM, please contact night-coverage [email or web address]  05/06/2023, 8:27 AM

## 2023-05-06 NOTE — Progress Notes (Signed)
 Patient Name: Kimberly [name] Date of Encounter: 05/06/2023 [facility] Cardiologist: Jerel Balding, MD   Interval Summary  .    Pt was admitted with increasing dyspnea and generalized weakness   I/O are -955 cc throughout admission    Vital Signs .    Vitals:   05/05/23 2245 05/05/23 2300 05/06/23 0223 05/06/23 0400  BP:  (!) 102/43 (!) 132/52   Pulse: (!) 59 60    Resp: (!) 25 20    Temp: (!) 97 F (36.1 C)   (!) 97.4 F (36.3 C)  TempSrc: Oral   Oral  SpO2: 92% 91%    Weight:      Height:        Intake/Output Summary (Last 24 hours) at 05/06/2023 0808 Last data filed at 05/06/2023 0500 Gross per 24 hour  Intake 720 ml  Output 950 ml  Net -230 ml      05/03/2023    5:53 PM 12/11/2022   11:33 PM 09/01/2022   10:32 AM  Last 3 Weights  Weight (lbs) 125 lb 123 lb 7.3 oz 124 lb 6.4 oz  Weight (kg) 56.7 kg 56 kg 56.427 kg      Telemetry/ECG    V-paced in the 60s- Personally Reviewed  CV Studies    Echocardiogram 05/04/2023 1. Left ventricular ejection fraction, by estimation, is 50 to 55%. The  left ventricle has low normal function. The left ventricle has no regional  wall motion abnormalities. Left ventricular diastolic parameters are  indeterminate. Elevated left  ventricular end-diastolic pressure.   2. Right ventricular systolic function is normal. The right ventricular  size is normal.   3. Left atrial size was severely dilated.   4. Right atrial size was severely dilated.   5. The mitral valve is normal in structure. Mild mitral valve  regurgitation. No evidence of mitral stenosis.   6. Tricuspid valve regurgitation is moderate.   7. The aortic valve is tricuspid. There is mild calcification of the  aortic valve. There is mild thickening of the aortic valve. Aortic valve  regurgitation is not visualized. Mild aortic valve stenosis.     Physical Exam .   GEN: elderly female  Neck:  + JVD  Cardiac:   Respiratory:   GI: Soft, nontender,  non-distended  EXT :   Neuro :      Patient Profile    Kimberly [name] is a 89 y.o. female has hx of  permanent atrial fibrillation with AV node ablation and secondary complete heart block s/p PPM in place with GEN change 06/2020, diastolic heart failure with preserved EF by echo in 2021 but history of nonischemic cardiomyopathy with improved EF following cardiac resynchronization therapy and admitted on 05/03/2023 for the evaluation of CHF exacerbation.  Assessment & Plan .     Acute on chronic HFpEF  Echocardiogram from December 30 reveals normal left ventricular systolic function.  We are unable to assess her diastolic function.  She has elevated LVEDP.  RV is normal.  She has severe enlargement of the right and left atrium.  She has mild mitral regurgitation.     Permanent atrial fibrillation status post AV node ablation High-grade AV block with PPM V-paced in the 60s.  Currently holding Eliquis  due to positive fecal occult, defer to GI when to start.   CKD Creatinine is up to 1.7 today . Agree with holding lasix  today   Hyponatremic hypervolemia She remains hyponatremic   Anemia 8.2.  Management per  primary team.  GI is following.  Goals of care Will discuss with patient's daughter, unfortunately husband died several years ago and she lives at home alone.  She has a son and daughter who do not live with in the state.  Currently DNR limited.     For questions or updates, please contact [facility] Please consult [email or web address] for contact info under      Aleene Passe, MD  05/06/2023 8:19 AM    [facility] [address] [place],  [address] [place], [place]  [address] Phone: [phone]; Fax: [phone]

## 2023-05-07 DIAGNOSIS — I509 Heart failure, unspecified: Secondary | ICD-10-CM | POA: Diagnosis not present

## 2023-05-07 DIAGNOSIS — Z515 Encounter for palliative care: Secondary | ICD-10-CM

## 2023-05-07 DIAGNOSIS — Z7189 Other specified counseling: Secondary | ICD-10-CM | POA: Diagnosis not present

## 2023-05-07 DIAGNOSIS — I5033 Acute on chronic diastolic (congestive) heart failure: Secondary | ICD-10-CM | POA: Diagnosis not present

## 2023-05-07 DIAGNOSIS — I4821 Permanent atrial fibrillation: Secondary | ICD-10-CM | POA: Diagnosis not present

## 2023-05-07 LAB — BASIC METABOLIC PANEL
Anion gap: 12 (ref 5–15)
BUN: 55 mg/dL — ABNORMAL HIGH (ref 8–23)
CO2: 25 mmol/L (ref 22–32)
Calcium: 8.4 mg/dL — ABNORMAL LOW (ref 8.9–10.3)
Chloride: 89 mmol/L — ABNORMAL LOW (ref 98–111)
Creatinine, Ser: 1.78 mg/dL — ABNORMAL HIGH (ref 0.44–1.00)
GFR, Estimated: 26 mL/min — ABNORMAL LOW (ref >60)
Glucose, Bld: 76 mg/dL (ref 70–99)
Potassium: 5.5 mmol/L — ABNORMAL HIGH (ref 3.5–5.1)
Sodium: 126 mmol/L — ABNORMAL LOW (ref 135–145)

## 2023-05-07 LAB — CBC
HCT: 26.2 % — ABNORMAL LOW (ref 36.0–46.0)
Hemoglobin: 8.2 g/dL — ABNORMAL LOW (ref 12.0–15.0)
MCH: 26.4 pg (ref 26.0–34.0)
MCHC: 31.3 g/dL (ref 30.0–36.0)
MCV: 84.2 fL (ref 80.0–100.0)
Platelets: 166 10*3/uL (ref 150–400)
RBC: 3.11 MIL/uL — ABNORMAL LOW (ref 3.87–5.11)
RDW: 18.6 % — ABNORMAL HIGH (ref 11.5–15.5)
WBC: 6.4 10*3/uL (ref 4.0–10.5)
nRBC: 0.3 % — ABNORMAL HIGH (ref 0.0–0.2)

## 2023-05-07 MED ORDER — FUROSEMIDE 10 MG/ML IJ SOLN
60.0000 mg | Freq: Once | INTRAMUSCULAR | Status: AC
  Administered 2023-05-07: 60 mg via INTRAVENOUS
  Filled 2023-05-07: qty 6

## 2023-05-07 MED ORDER — APIXABAN 2.5 MG PO TABS
2.5000 mg | ORAL_TABLET | Freq: Two times a day (BID) | ORAL | Status: DC
  Administered 2023-05-07 – 2023-05-12 (×11): 2.5 mg via ORAL
  Filled 2023-05-07 (×11): qty 1

## 2023-05-07 MED ORDER — METOLAZONE 5 MG PO TABS
5.0000 mg | ORAL_TABLET | Freq: Once | ORAL | Status: AC
  Administered 2023-05-07: 5 mg via ORAL
  Filled 2023-05-07: qty 1

## 2023-05-07 MED ORDER — SODIUM ZIRCONIUM CYCLOSILICATE 10 G PO PACK
10.0000 g | PACK | Freq: Once | ORAL | Status: AC
  Administered 2023-05-07: 10 g via ORAL
  Filled 2023-05-07: qty 1

## 2023-05-07 NOTE — Progress Notes (Signed)
 Belongings collected. Report called. Pt stable at time of transfer.

## 2023-05-07 NOTE — Progress Notes (Addendum)
 Patient Name: Kimberly [name] Date of Encounter: 05/07/2023 [facility] Cardiologist: Jerel Balding, MD   Interval Summary  .    Patient is declining, IV diuretics were stopped yesterday due to progression renal decline.  Overall symptomatically feels unchanged and is having more persistent cough.  Poor urine output  Vital Signs .    Vitals:   05/07/23 0358 05/07/23 0400 05/07/23 0800 05/07/23 0831  BP: (!) 138/56 (!) 138/56 (!) 151/59   Pulse: 91 65 (!) 59 61  Resp: 18 17 14 20   Temp: (!) 95.6 F (35.3 C)  97.6 F (36.4 C)   TempSrc: Axillary  Oral   SpO2: 97% 100% 100% 100%  Weight:      Height:        Intake/Output Summary (Last 24 hours) at 05/07/2023 1020 Last data filed at 05/06/2023 1029 Gross per 24 hour  Intake 300 ml  Output --  Net 300 ml      05/03/2023    5:53 PM 12/11/2022   11:33 PM 09/01/2022   10:32 AM  Last 3 Weights  Weight (lbs) 125 lb 123 lb 7.3 oz 124 lb 6.4 oz  Weight (kg) 56.7 kg 56 kg 56.427 kg      Telemetry/ECG    V-paced in the 60s- Personally Reviewed  CV Studies    Echocardiogram 05/04/2023 1. Left ventricular ejection fraction, by estimation, is 50 to 55%. The  left ventricle has low normal function. The left ventricle has no regional  wall motion abnormalities. Left ventricular diastolic parameters are  indeterminate. Elevated left  ventricular end-diastolic pressure.   2. Right ventricular systolic function is normal. The right ventricular  size is normal.   3. Left atrial size was severely dilated.   4. Right atrial size was severely dilated.   5. The mitral valve is normal in structure. Mild mitral valve  regurgitation. No evidence of mitral stenosis.   6. Tricuspid valve regurgitation is moderate.   7. The aortic valve is tricuspid. There is mild calcification of the  aortic valve. There is mild thickening of the aortic valve. Aortic valve  regurgitation is not visualized. Mild aortic valve stenosis.      Physical Exam .   GEN: No acute distress.   Neck: Severe JVD Cardiac: RRR, no murmurs, rubs, or gallops.  Respiratory: Very poor respiratory effort GI: Distended, firm abdomen MS: Probably 1-2+ edema and erythematous.  Patient would not let me touch due to severe pain  Patient Profile    Kimberly [name] is a 89 y.o. female has hx of  permanent atrial fibrillation with AV node ablation and secondary complete heart block s/p PPM in place with GEN change 06/2020, diastolic heart failure with preserved EF by echo in 2021 but history of nonischemic cardiomyopathy with improved EF following cardiac resynchronization therapy and admitted on 05/03/2023 for the evaluation of CHF exacerbation.  Assessment & Plan .     Acute on chronic HFpEF Suspect that she is low output heart failure and overtly volume up and continues to swell.  Palliative care needs to see today.  She did not respond with IV diuretics 60mg  BID and has declining renal function, poor urine output.  Her MAPs have been low and she is hypothermic.  At this point need to consider comfort care if our course continues to decline. Not sure how aggressive we are wanting to be with diuresis. Need to outline GOC.  Giving one dose metolazone  5mg  and once IV lasix   60mg  to see how she responds.  Do not continue beta-blocker  Permanent atrial fibrillation status post AV node ablation High-grade AV block with PPM V-paced in the 60s.  Continue Eliquis  2.5 mg twice daily  CKD Creatinine significantly 1.2 prior to diuresis.  Attempted and now 1.78.  This is likely a gross underestimation given her lack of muscle mass.  She is heading into renal failure, potassium 5.5  Hyponatremic hypervolemia Sodium 126.  Poor prognostic indicator  Anemia 8.2.  Management per primary team.  GI is following.  Goals of care Patient's daughter was called again about patient's poor prognosis.  She will be arriving sometime today.  She needs palliative care  today as she is is decompensating.   For questions or updates, please contact [facility] Please consult [email or web address] for contact info under        Signed, Thom LITTIE Sluder, PA-C     Attending Note:   The patient was seen and examined.  Agree with assessment and plan as noted above.  Changes made to the above note as needed.  Patient seen and independently examined with Thom Sluder, PA .   We discussed all aspects of the encounter. I agree with the assessment and plan as stated above.    Acute on chronic CHF:   will try adding metolazone  5 mg prior to lasix  . This may help with diuresis  Our options at this point are significantly limited.       I have spent a total of 40 minutes with patient reviewing hospital  notes , telemetry, EKGs, labs and examining patient as well as establishing an assessment and plan that was discussed with the patient.  > 50% of time was spent in direct patient care.    Aleene DOROTHA Passe, Mickey., MD, FACC 05/07/2023, 2:49 PM [address]  [address] Office [phone] Pager [phone]

## 2023-05-07 NOTE — Consult Note (Signed)
 Consultation Note Date: 05/07/2023   Patient Name: Kimberly [name]  DOB: 12-Oct-1926  MRN: [id number]  Age / Sex: 89 y.o., female  PCP: Renato Dorothey HERO, NP Referring Physician: Madelyne Owen LABOR, MD  Reason for Consultation: Establishing goals of care  HPI/Patient Profile: 89 y.o. female  with past medical history of HFpEF, atrial fibrillation, hypertension, bipolar admitted on 05/03/2023 with dizziness, shortness of breath, leg swelling due to heart failure exacerbation. Hospitalization complicated by worsening renal failure.   Clinical Assessment and Goals of Care: Consult received and chart review completed. I met today with Kimberly [name] but no family at bedside. Kimberly [name] is able to tell me about her poor prognosis with prompting although she gets the details confused a little. She is difficult to focus on conversation and shares about her father and his German temper while raising 11 children alone after her mother died in automobile accident when she was young. She talks of her children. She tells me the same stories multiple times.   I was able to discuss goals of care. Kimberly [name] and I review poor prognosis. She was able to share with me that she would want more time if possible but understands that if the medicines do not work then options are limited. DNR confirmed. She does not want to suffer at the end of her life. She wants comfort with further decline. She tells me that she looks forward to seeing her husband again who died long ago. She expresses being at peace with end of life.   I called and spoke with daughter, Kimberly [name], who is en-route to come visit with her mother. She has spoken with medical team and understands her mother's poor prognosis. I shared my conversation with her mother. Kimberly [name] shares that her mother has up until now expresses desire to do all to keep her alive. Kimberly [name] is relieved that her mother  has seemed to find acceptance with end of life. We discussed plan for comfort with further decline and use of hospice. Kimberly [name] is open to comfort and hospice as indicated.   All questions/concerns addressed. Emotional support provided.   Primary Decision Maker PATIENT and daughter Kimberly [name]    SUMMARY OF RECOMMENDATIONS   - DNR - Comfort with further decline  Code Status/Advance Care Planning: DNR   Symptom Management:  Per attending, cardiology  Prognosis:  Prognosis poor. High risk for acute decline.   Discharge Planning: To Be Determined      Primary Diagnoses: Present on Admission: **None**   I have reviewed the medical record, interviewed the patient and family, and examined the patient. The following aspects are pertinent.  Past Medical History:  Diagnosis Date   Acute blood loss anemia 10/11/2012   Acute diverticulitis 08/24/2013   Acute on chronic combined systolic and diastolic CHF, NYHA class 4 (HCC) 11/15/2013   Antral ulcer 10/11/2012   Aortic atherosclerosis (HCC)    Atrial fibrillation (HCC)    Bipolar affective disorder (HCC) 03/23/2017   Cardiomyopathy, nonischemic (HCC)    Chronic anticoagulation  10/12/2012   CKD (chronic kidney disease) stage 3, GFR 30-59 ml/min (HCC) 10/12/2012   Depression    Diverticulosis    DVT (deep venous thrombosis) (HCC)    left peroneal veins/IVC Filter placed as per hospital discharge 10/24/21   Erosive esophagitis 10/11/2012   Esophagitis    Fibromyalgia    Glaucoma    H/O echocardiogram 2007   EF 40-45%,          Hiatal hernia    History of anemia due to CKD    Hypertension    Internal hemorrhoids    Nephrolithiasis    Osteoarthritis    Pacemaker    Last saw cards 07/2013   Prepyloric ulcer    Scoliosis    Tubular adenoma of colon    Social History   Socioeconomic History   Marital status: Widowed    Spouse name: Not on file   Number of children: Not on file   Years of education: Not on file   Highest  education level: Some college, no degree  Occupational History   Occupation: public librarian    Employer: RETIRED    Comment: retired  Tobacco Use   Smoking status: Former    Current packs/day: 0.00    Average packs/day: 0.5 packs/day for 30.0 years (15.0 ttl pk-yrs)    Types: Cigarettes    Start date: 12/14/1974    Quit date: 12/13/2004    Years since quitting: 18.4   Smokeless tobacco: Never   Tobacco comments:    former smoker  Vaping Use   Vaping status: Never Used  Substance and Sexual Activity   Alcohol  use: Not Currently    Comment: history of drinking a 1/2 glass of wine in the evening   Drug use: No   Sexual activity: Never  Other Topics Concern   Not on file  Social History Narrative   No home exercise program. PT ordered this week.   Social Drivers of Corporate Investment Banker Strain: Low Risk  (01/06/2023)   Received from [facility]   Overall Financial Resource Strain (CARDIA)    Difficulty of Paying Living Expenses: Not very hard  Food Insecurity: No Food Insecurity (05/06/2023)   Hunger Vital Sign    Worried About Running Out of Food in the Last Year: Never true    Ran Out of Food in the Last Year: Never true  Transportation Needs: Unknown (05/06/2023)   PRAPARE - Administrator, Civil Service (Medical): Not on file    Lack of Transportation (Non-Medical): No  Physical Activity: Inactive (08/15/2019)   Exercise Vital Sign    Days of Exercise per Week: 0 days    Minutes of Exercise per Session: 0 min  Stress: No Stress Concern Present (10/22/2018)   [employer] of Occupational Health - Occupational Stress Questionnaire    Feeling of Stress : Only a little  Social Connections: Socially Isolated (05/06/2023)   Social Connection and Isolation Panel [NHANES]    Frequency of Communication with Friends and Family: Twice a week    Frequency of Social Gatherings with Friends and Family: Once a week    Attends Religious Services: Never     Database Administrator or Organizations: No    Attends Banker Meetings: Never    Marital Status: Widowed   Family History  Problem Relation Age of Onset   Cancer Sister    Asthma Sister    Heart failure Brother    Pulmonary embolism  Brother    Cancer Brother    Colon cancer Neg Hx    Scheduled Meds:  apixaban   2.5 mg Oral BID   Chlorhexidine  Gluconate Cloth  6 each Topical Daily   vitamin B-12  1,000 mcg Oral Daily   doxycycline   100 mg Oral Q12H   mirtazapine   15 mg Oral QHS   pantoprazole   40 mg Oral Daily   sertraline   50 mg Oral Daily   traZODone   50 mg Oral QHS   Continuous Infusions: PRN Meds:.acetaminophen  **OR** acetaminophen , ondansetron  **OR** ondansetron  (ZOFRAN ) IV, mouth rinse, oxyCODONE , polyethylene glycol Allergies  Allergen Reactions   Penicillins Hives    DID THE REACTION INVOLVE: Swelling of the face/tongue/throat, SOB, or low BP? Unknown  Sudden or severe rash/hives, skin peeling, or the inside of the mouth or nose? Yes  Did it require medical treatment? Yes  When did it last happen?    74 or 89 years old    If all above answers are "NO", may proceed with cephalosporin use.  DID THE REACTION INVOLVE: Swelling of the face/tongue/throat, SOB, or low BP? Unknown  Sudden or severe rash/hives, skin peeling, or the inside of the mouth or nose? Yes  Did it require medical treatment? Yes  When did it last happen?    61 or 89 years old    If all above answers are NO, may proceed with cephalosporin use.   Ceftriaxone  Itching   Camphor Other (See Comments)    Allergic, per facility   Ciprofloxacin  Diarrhea and Other (See Comments)    Possibly caused diarrhea November 2018   Flagyl  [Metronidazole ] Other (See Comments)    Possibly caused diarrhea November 2018   Papaya Derivatives Hives   Iodine Rash and Other (See Comments)    If injected,  Rash/irritated skin reaction welts   Sulfa Antibiotics Rash   Review of Systems   Constitutional:  Positive for activity change and fatigue.  Neurological:  Positive for weakness.    Physical Exam Vitals and nursing note reviewed.  Constitutional:      Appearance: She is ill-appearing.  Cardiovascular:     Rate and Rhythm: Normal rate.  Pulmonary:     Effort: No tachypnea, accessory muscle usage or respiratory distress.  Abdominal:     General: Abdomen is flat.     Palpations: Abdomen is soft.  Neurological:     Mental Status: She is alert.     Comments: Repeats herself often; mostly aware but forgetful     Vital Signs: BP (!) 135/50   Pulse 69   Temp 98 F (36.7 C)   Resp 13   Ht 5' 7 (1.702 m)   Wt 64 kg   SpO2 98%   BMI 22.10 kg/m  Pain Scale: 0-10   Pain Score: 8    SpO2: SpO2: 98 % O2 Device:SpO2: 98 % O2 Flow Rate: .O2 Flow Rate (L/min): 2 L/min  IO: Intake/output summary:  Intake/Output Summary (Last 24 hours) at 05/07/2023 1510 Last data filed at 05/07/2023 1037 Gross per 24 hour  Intake --  Output 100 ml  Net -100 ml    LBM: Last BM Date : 05/05/22 Baseline Weight: Weight: 56.7 kg Most recent weight: Weight: 64 kg     Palliative Assessment/Data:    Time Total: 80 min  Greater than 50%  of this time was spent counseling and coordinating care related to the above assessment and plan.  Signed by: Bernarda Kitty, NP Palliative Medicine Team Pager # [phone] (  M-F 8a-5p) Team Phone # [phone] (Nights/Weekends)

## 2023-05-07 NOTE — Progress Notes (Addendum)
 PROGRESS NOTE    Kimberly [name]  FMW:[id number] DOB: December 02, 1926 DOA: 05/03/2023 PCP: Renato Dorothey HERO, NP   Brief Narrative: 89 year old with past medical history significant for CHF, A-fib, bipolar, hypertension presented to the emergency department with dizziness, shortness of breath, leg swelling.  She has been weeping from her bilateral lower extremity, reports weight gain.  Due to worsening discomfort in her legs she presents to the ED.  She was found to have a sodium of 127, creatinine 1.2, hemoglobin 8.1, BNP 598.  Chest x-ray showed cardiomegaly with increased vascular prominence concerning for volume overload.  Patient received IV Lasix  in the ED. admitted for further evaluation   Assessment & Plan:   Principal Problem:   CHF (congestive heart failure) (HCC) Active Problems:   Acute on chronic diastolic CHF (congestive heart failure) (HCC)   1-Acute on Chronic Diastolic Heart Failure Exacerbation;  -Present with dyspnea, weight gain, lower extremity edema, chest x-ray with increased vascular prominence.  BNP elevated at 598. -Daily weight -Strict I and O. Negative 995 -ECHO : Ef 50%, indeterminate diastolic Dysfunction.  -Cr increase to 1.7, lasix  held yesterday. Today she is still overload, CR stable at 1.7. agree with Resumption of IV lasix  and metolazone , now BP is better.  Cardiology following, recommend comfort care if further declining.   2-Anemia,  occult blood positive Presents with anemia, occult blood positive.  Held Eliquis . Discussed with GI and cardiology.  GI was consulted. BP labile for endoscopy. Could consider Capsule endoscopy.  Anemia panel. Started  Iron  and B 12.  CT abdomen pelvis. Negative for Mass Hb stable, at 8, ok to resume Eliquis  per GI.   3-AKI CKD 3B Monitor Cr, stable.  Cr increase to 1.7.  Monitor on resumption of lasix .   Hypertension: -BP low . Holding coreg .   Hypotension; concern for low cardiac out put.  Cardiology following.   Received Albumin .  Improved.   Insomnia: On Remeron .   Paroxysmal A-fib: Hold eliquis . Continue with coreg .   GERD: PPI  Depression: Continue with Zoloft .   Hyponatremia:   TSH 5, mildly elevated. Checking free t3 an d T4, Cortisol. 12 normal.  Suspect Hypervolemia form HF>   Hypothermia. TSH. Mildly elevated.  No Leukocytosis, x ray finding consistent with HF.  UA negative for infection.   Chronic LE edema and  redness, venous stasis. Super impose Cellulitis.  On Doxy, has allergies to Cephalosporin.  She  was on Eliquis  prior to admission  Hyperkalemia; Lokelma  ordered.   Estimated body mass index is 19.58 kg/m as calculated from the following:   Height as of this encounter: 5' 7 (1.702 m).   Weight as of this encounter: 56.7 kg.   DVT prophylaxis: SCD Code Status: discussed with patient, wishes to be DNR> discussed with Daughter  Family Communication: Daughter updated 12/30 12/31. Plan to consult palliative care for goals of care.  Disposition Plan:  Status is: Inpatient Remains inpatient appropriate because: management of HF    Consultants:  Cardiology   Procedures:  ECHO  Antimicrobials:    Subjective: She is doing ok, she seems more alert. Report dyspnea.  LE edema persist, redness improved.   Objective: Vitals:   05/07/23 0200 05/07/23 0300 05/07/23 0358 05/07/23 0400  BP: (!) 86/31 (!) 176/62 (!) 138/56 (!) 138/56  Pulse: (!) 59 71 91 65  Resp: 11 (!) 23 18 17   Temp:   (!) 95.6 F (35.3 C)   TempSrc:   Axillary   SpO2: 100% 100% 97%  100%  Weight:      Height:        Intake/Output Summary (Last 24 hours) at 05/07/2023 0649 Last data filed at 05/06/2023 1029 Gross per 24 hour  Intake 300 ml  Output 600 ml  Net -300 ml   Filed Weights   05/03/23 1753  Weight: 56.7 kg    Examination:  General exam: Frail, NAD Respiratory system: BL Crackles.  Cardiovascular system: S 1, S 2 RRR Gastrointestinal system: BS present, soft,  nt Central nervous system: Alert Extremities: redness less pronounced, BL edema   Data Reviewed: I have personally reviewed following labs and imaging studies  CBC: Recent Labs  Lab 05/03/23 1850 05/04/23 0705 05/05/23 0930 05/06/23 0448 05/07/23 0337  WBC 5.7 4.9 4.2 10.7* 6.4  HGB 8.1* 7.9* 8.2* 7.9* 8.2*  HCT 25.6* 24.3* 26.0* 25.0* 26.2*  MCV 83.9 82.7 83.9 83.3 84.2  PLT 202 177 172 170 166   Basic Metabolic Panel: Recent Labs  Lab 05/03/23 1850 05/04/23 0705 05/05/23 0803 05/05/23 0930 05/06/23 0328 05/07/23 0337  NA 127* 127* 126*  --  127* 126*  K 4.8 4.0 3.7  --  4.3 5.5*  CL 93* 90* 91*  --  90* 89*  CO2 25 28 27   --  26 25  GLUCOSE 93 103* 89  --  104* 76  BUN 40* 42* 46*  --  52* 55*  CREATININE 1.22* 1.29* 1.21*  --  1.73* 1.78*  CALCIUM  8.7* 8.6* 8.1*  --  8.3* 8.4*  MG  --   --   --  2.1  --   --    GFR: Estimated Creatinine Clearance: 17.3 mL/min (A) (by C-G formula based on SCr of 1.78 mg/dL (H)). Liver Function Tests: No results for input(s): AST, ALT, ALKPHOS, BILITOT, PROT, ALBUMIN  in the last 168 hours. No results for input(s): LIPASE, AMYLASE in the last 168 hours. No results for input(s): AMMONIA in the last 168 hours. Coagulation Profile: No results for input(s): INR, PROTIME in the last 168 hours. Cardiac Enzymes: No results for input(s): CKTOTAL, CKMB, CKMBINDEX, TROPONINI in the last 168 hours. BNP (last 3 results) No results for input(s): PROBNP in the last 8760 hours. HbA1C: No results for input(s): HGBA1C in the last 72 hours. CBG: Recent Labs  Lab 05/03/23 1905  GLUCAP 94   Lipid Profile: No results for input(s): CHOL, HDL, LDLCALC, TRIG, CHOLHDL, LDLDIRECT in the last 72 hours. Thyroid  Function Tests: Recent Labs    05/04/23 1108 05/05/23 0307  TSH 5.234*  --   FREET4  --  0.91  T3FREE  --  1.7*   Anemia Panel: Recent Labs    05/04/23 1108  VITAMINB12 288  FOLATE  11.7  FERRITIN 36  TIBC 484*  IRON  28  RETICCTPCT 1.7   Sepsis Labs: Recent Labs  Lab 05/05/23 1343  LATICACIDVEN 1.5    Recent Results (from the past 240 hours)  Resp panel by RT-PCR (RSV, Flu A&B, Covid) Anterior Nasal Swab     Status: None   Collection Time: 05/03/23  6:41 PM   Specimen: Anterior Nasal Swab  Result Value Ref Range Status   SARS Coronavirus 2 by RT PCR NEGATIVE NEGATIVE Final    Comment: (NOTE) SARS-CoV-2 target nucleic acids are NOT DETECTED.  The SARS-CoV-2 RNA is generally detectable in upper respiratory specimens during the acute phase of infection. The lowest concentration of SARS-CoV-2 viral copies this assay can detect is 138 copies/mL. A negative result does not preclude  SARS-Cov-2 infection and should not be used as the sole basis for treatment or other patient management decisions. A negative result may occur with  improper specimen collection/handling, submission of specimen other than nasopharyngeal swab, presence of viral mutation(s) within the areas targeted by this assay, and inadequate number of viral copies(<138 copies/mL). A negative result must be combined with clinical observations, patient history, and epidemiological information. The expected result is Negative.  Fact Sheet for Patients:  bloggercourse.com  Fact Sheet for Healthcare Providers:  seriousbroker.it  This test is no t yet approved or cleared by the [place]  FDA and  has been authorized for detection and/or diagnosis of SARS-CoV-2 by FDA under an Emergency Use Authorization (EUA). This EUA will remain  in effect (meaning this test can be used) for the duration of the COVID-19 declaration under Section 564(b)(1) of the Act, 21 U.S.C.section 360bbb-3(b)(1), unless the authorization is terminated  or revoked sooner.       Influenza A by PCR NEGATIVE NEGATIVE Final   Influenza B by PCR NEGATIVE NEGATIVE Final     Comment: (NOTE) The Xpert Xpress SARS-CoV-2/FLU/RSV plus assay is intended as an aid in the diagnosis of influenza from Nasopharyngeal swab specimens and should not be used as a sole basis for treatment. Nasal washings and aspirates are unacceptable for Xpert Xpress SARS-CoV-2/FLU/RSV testing.  Fact Sheet for Patients: bloggercourse.com  Fact Sheet for Healthcare Providers: seriousbroker.it  This test is not yet approved or cleared by the [place]  FDA and has been authorized for detection and/or diagnosis of SARS-CoV-2 by FDA under an Emergency Use Authorization (EUA). This EUA will remain in effect (meaning this test can be used) for the duration of the COVID-19 declaration under Section 564(b)(1) of the Act, 21 U.S.C. section 360bbb-3(b)(1), unless the authorization is terminated or revoked.     Resp Syncytial Virus by PCR NEGATIVE NEGATIVE Final    Comment: (NOTE) Fact Sheet for Patients: bloggercourse.com  Fact Sheet for Healthcare Providers: seriousbroker.it  This test is not yet approved or cleared by the [place]  FDA and has been authorized for detection and/or diagnosis of SARS-CoV-2 by FDA under an Emergency Use Authorization (EUA). This EUA will remain in effect (meaning this test can be used) for the duration of the COVID-19 declaration under Section 564(b)(1) of the Act, 21 U.S.C. section 360bbb-3(b)(1), unless the authorization is terminated or revoked.  Performed at [facility], [address], [place], [place] [address]   MRSA Next Gen by PCR, Nasal     Status: None   Collection Time: 05/04/23  4:00 PM   Specimen: Nasal Mucosa; Nasal Swab  Result Value Ref Range Status   MRSA by PCR Next Gen NOT DETECTED NOT DETECTED Final    Comment: (NOTE) The GeneXpert MRSA Assay (FDA approved for NASAL specimens only), is one component of a  comprehensive MRSA colonization surveillance program. It is not intended to diagnose MRSA infection nor to guide or monitor treatment for MRSA infections. Test performance is not FDA approved in patients less than 60 years old. Performed at [facility], [address] [place], [place] [address]          Radiology Studies: No results found.       Scheduled Meds:  Chlorhexidine  Gluconate Cloth  6 each Topical Daily   vitamin B-12  1,000 mcg Oral Daily   doxycycline   100 mg Oral Q12H   mirtazapine   15 mg Oral QHS   pantoprazole   40 mg Oral  Daily   sertraline   50 mg Oral Daily   sodium zirconium cyclosilicate   10 g Oral Once   traZODone   50 mg Oral QHS   Continuous Infusions:     LOS: 4 days    Time spent: 35 minutes     Kyrie Bun A Mccall Lomax, MD [facility]   If 7PM-7AM, please contact night-coverage [email or web address]  05/07/2023, 6:49 AM

## 2023-05-07 NOTE — Plan of Care (Signed)

## 2023-05-08 DIAGNOSIS — I5043 Acute on chronic combined systolic (congestive) and diastolic (congestive) heart failure: Secondary | ICD-10-CM | POA: Diagnosis not present

## 2023-05-08 DIAGNOSIS — I5033 Acute on chronic diastolic (congestive) heart failure: Secondary | ICD-10-CM | POA: Diagnosis not present

## 2023-05-08 LAB — BASIC METABOLIC PANEL
Anion gap: 12 (ref 5–15)
BUN: 65 mg/dL — ABNORMAL HIGH (ref 8–23)
CO2: 28 mmol/L (ref 22–32)
Calcium: 8.6 mg/dL — ABNORMAL LOW (ref 8.9–10.3)
Chloride: 87 mmol/L — ABNORMAL LOW (ref 98–111)
Creatinine, Ser: 1.44 mg/dL — ABNORMAL HIGH (ref 0.44–1.00)
GFR, Estimated: 34 mL/min — ABNORMAL LOW (ref >60)
Glucose, Bld: 81 mg/dL (ref 70–99)
Potassium: 4 mmol/L (ref 3.5–5.1)
Sodium: 127 mmol/L — ABNORMAL LOW (ref 135–145)

## 2023-05-08 LAB — CBC
HCT: 25.1 % — ABNORMAL LOW (ref 36.0–46.0)
Hemoglobin: 8 g/dL — ABNORMAL LOW (ref 12.0–15.0)
MCH: 26.4 pg (ref 26.0–34.0)
MCHC: 31.9 g/dL (ref 30.0–36.0)
MCV: 82.8 fL (ref 80.0–100.0)
Platelets: 175 10*3/uL (ref 150–400)
RBC: 3.03 MIL/uL — ABNORMAL LOW (ref 3.87–5.11)
RDW: 18.4 % — ABNORMAL HIGH (ref 11.5–15.5)
WBC: 6.4 10*3/uL (ref 4.0–10.5)
nRBC: 0.5 % — ABNORMAL HIGH (ref 0.0–0.2)

## 2023-05-08 MED ORDER — ENSURE ENLIVE PO LIQD
237.0000 mL | Freq: Two times a day (BID) | ORAL | Status: DC
  Administered 2023-05-09 – 2023-05-12 (×8): 237 mL via ORAL

## 2023-05-08 MED ORDER — HALOPERIDOL LACTATE 5 MG/ML IJ SOLN: 1.0000 mg | Freq: Once | INTRAMUSCULAR | Status: DC

## 2023-05-08 NOTE — Plan of Care (Signed)

## 2023-05-08 NOTE — Plan of Care (Signed)

## 2023-05-08 NOTE — Progress Notes (Signed)
 PROGRESS NOTE    Kimberly [name]  FMW:[id number]  DOB: 10/04/1926  DOA: 05/03/2023 PCP: Renato Dorothey HERO, NP Outpatient Specialists:   Hospital course:  89-year-old female with HFpEF EF 50%, A-fib, HTN, bipolar illness was admitted with decompensated heart failure and bilateral lower extremity weeping.  Workup revealed hyponatremia sodium of 127 and AKI with creatinine of 1.2.  BNP was 600.  Chest x-ray showed cardiomegaly and pulmonary edema.  Patient has been treated with diuresis, cardiology is following as is palliative care.   Subjective:  Patient herself has no complaints today, feels she is doing well.  Attentive daughter at bedside without new concerns.     Objective: Vitals:   05/07/23 2014 05/08/23 0004 05/08/23 0318 05/08/23 0500  BP: (!) 115/57 (!) 106/92 (!) 135/46   Pulse: 60 62 (!) 58   Resp: 15 14 14    Temp: (!) 97.4 F (36.3 C) 97.7 F (36.5 C)    TempSrc: Oral Oral    SpO2: 100% 100% 93%   Weight:    60.9 kg  Height:        Intake/Output Summary (Last 24 hours) at 05/08/2023 1729 Last data filed at 05/08/2023 0600 Gross per 24 hour  Intake 250 ml  Output 600 ml  Net -350 ml   Filed Weights   05/03/23 1753 05/07/23 1033 05/08/23 0500  Weight: 56.7 kg 64 kg 60.9 kg     Exam:  General: Frail, chronically ill-appearing female appearing stated age sitting on side of bed eating breakfast in no respiratory distress Eyes: sclera anicteric, conjuctiva mild injection bilaterally CVS: S1-S2, regular  Respiratory:  decreased air entry bilaterally secondary to decreased inspiratory effort, rales at bases  GI: NABS, soft, NT  LE: No lower extremity edema, no erythema    Data Reviewed:  Basic Metabolic Panel: Recent Labs  Lab 05/04/23 0705 05/05/23 0803 05/05/23 0930 05/06/23 0328 05/07/23 0337 05/08/23 0518  NA 127* 126*  --  127* 126* 127*  K 4.0 3.7  --  4.3 5.5* 4.0  CL 90* 91*  --  90* 89* 87*  CO2 28 27  --  26 25 28   GLUCOSE 103* 89   --  104* 76 81  BUN 42* 46*  --  52* 55* 65*  CREATININE 1.29* 1.21*  --  1.73* 1.78* 1.44*  CALCIUM  8.6* 8.1*  --  8.3* 8.4* 8.6*  MG  --   --  2.1  --   --   --     CBC: Recent Labs  Lab 05/04/23 0705 05/05/23 0930 05/06/23 0448 05/07/23 0337 05/08/23 0518  WBC 4.9 4.2 10.7* 6.4 6.4  HGB 7.9* 8.2* 7.9* 8.2* 8.0*  HCT 24.3* 26.0* 25.0* 26.2* 25.1*  MCV 82.7 83.9 83.3 84.2 82.8  PLT 177 172 170 166 175     Scheduled Meds:  apixaban   2.5 mg Oral BID   Chlorhexidine  Gluconate Cloth  6 each Topical Daily   vitamin B-12  1,000 mcg Oral Daily   doxycycline   100 mg Oral Q12H   mirtazapine   15 mg Oral QHS   pantoprazole   40 mg Oral Daily   sertraline   50 mg Oral Daily   traZODone   50 mg Oral QHS   Continuous Infusions:   Assessment & Plan:   Decompensated HFpEF versus low output heart failure Hyponatremia Acute on CKD Relative hypotension--now improved Patient has been tolerating diuresis however has had some worsening of creatinine Cardiology continues to follow and they note that she is  declining likely has low output heart failure with low MAP and hypothermia.They had recommended palliative care evaluation possible comfort care. Cardiology is managing daily diuretic doses including Addition of metolazone  has resulted in increased urine output and improved creatinine  Goals for care Very much appreciate palliative care consultation who spoke with daughter Kimberly [name] who is at bedside today Kimberly [name] does understand that her mother has a poor prognosis and her mother appears to be excepting this as well Continue present therapeutic plan however can consider transition to comfort care if patient does not improve  Hypothermia Possibly secondary to low output heart failure No evidence for infection TSH only minimally elevated  Atrial fibrillation High-grade AV block s/p PPM Ventricular paced in the 60s Continue Eliquis  for now  Insomnia and depression Continue Remeron  and  Zoloft     DVT prophylaxis: Eliquis  Code Status: DNR Family Communication: Patient's daughter was at bedside throughout     Studies: No results found.  Principal Problem:   CHF (congestive heart failure) (HCC) Active Problems:   Acute on chronic diastolic CHF (congestive heart failure) (HCC)     Kimberly [name], [facility]  If 7PM-7AM, please contact night-coverage [email or web address]   LOS: 5 days

## 2023-05-08 NOTE — Progress Notes (Addendum)
 Patient Name: Kimberly [name] Date of Encounter: 05/08/2023  [facility] Cardiologist: Jerel Balding, MD   Interval Summary  .    Patient is declining, IV diuretics were stopped yesterday due to progression renal decline.  Overall symptomatically feels unchanged and is having more persistent cough.    Diuresis is better after the addition of metolazone  . She may benefit from getting metolazone  every 3 days. Will need her BMP monitored     Vital Signs .    Vitals:   05/07/23 2014 05/08/23 0004 05/08/23 0318 05/08/23 0500  BP: (!) 115/57 (!) 106/92 (!) 135/46   Pulse: 60 62 (!) 58   Resp: 15 14 14    Temp: (!) 97.4 F (36.3 C) 97.7 F (36.5 C)    TempSrc: Oral Oral    SpO2: 100% 100% 93%   Weight:    60.9 kg  Height:        Intake/Output Summary (Last 24 hours) at 05/08/2023 0810 Last data filed at 05/08/2023 0600 Gross per 24 hour  Intake 250 ml  Output 700 ml  Net -450 ml      05/08/2023    5:00 AM 05/07/2023   10:33 AM 05/03/2023    5:53 PM  Last 3 Weights  Weight (lbs) 134 lb 4.2 oz 141 lb 1.5 oz 125 lb  Weight (kg) 60.9 kg 64 kg 56.7 kg      Telemetry/ECG    V-paced in the 60s- Personally Reviewed  CV Studies    Echocardiogram 05/04/2023 1. Left ventricular ejection fraction, by estimation, is 50 to 55%. The  left ventricle has low normal function. The left ventricle has no regional  wall motion abnormalities. Left ventricular diastolic parameters are  indeterminate. Elevated left  ventricular end-diastolic pressure.   2. Right ventricular systolic function is normal. The right ventricular  size is normal.   3. Left atrial size was severely dilated.   4. Right atrial size was severely dilated.   5. The mitral valve is normal in structure. Mild mitral valve  regurgitation. No evidence of mitral stenosis.   6. Tricuspid valve regurgitation is moderate.   7. The aortic valve is tricuspid. There is mild calcification of the  aortic valve. There is  mild thickening of the aortic valve. Aortic valve  regurgitation is not visualized. Mild aortic valve stenosis.     Physical Exam .   GEN: No acute distress.   Neck: Severe JVD Cardiac: RRR, 2/6 systolic murmur   Respiratory: Very poor respiratory effort GI: Distended, firm abdomen MS: Probably 1-2+ edema and erythematous.  Patient would not let me touch due to severe pain  Patient Profile    Kimberly [name] is a 89 y.o. female has hx of  permanent atrial fibrillation with AV node ablation and secondary complete heart block s/p PPM in place with GEN change 06/2020, diastolic heart failure with preserved EF by echo in 2021 but history of nonischemic cardiomyopathy with improved EF following cardiac resynchronization therapy and admitted on 05/03/2023 for the evaluation of CHF exacerbation.  Assessment & Plan .     Acute on chronic HFpEF Suspect that she is low output heart failure and overtly volume up and continues to swell.  Palliative care needs to see today.  She did not respond with IV diuretics 60mg  BID and has declining renal function, poor urine output.  Her MAPs have been low and she is hypothermic.  At this point need to consider comfort care if our course continues  to decline. Not sure how aggressive we are wanting to be with diuresis. Need to outline GOC.  Giving one dose metolazone  5mg  and once IV lasix  60mg  to see how she responds.  Do not continue beta-blocker  Permanent atrial fibrillation status post AV node ablation High-grade AV block with PPM V-paced in the 60s.  Continue Eliquis  2.5 mg twice daily  CKD Creatinine significantly 1.2 prior to diuresis.  Attempted and now 1.78.  This is likely a gross underestimation given her lack of muscle mass.  She is heading into renal failure, potassium 5.5  Hyponatremic hypervolemia Sodium 126.  Poor prognostic indicator  Anemia 8.2.  Management per primary team.  GI is following.  Goals of care Patient's daughter was called  again about patient's poor prognosis.  She will be arriving sometime today.  She needs palliative care today as she is is decompensating.   For questions or updates, please contact [facility] Please consult [email or web address] for contact info under        Signed, Aleene Passe, MD     Attending Note:   The patient was seen and examined.  Agree with assessment and plan as noted above.  Changes made to the above note as needed.  Patient seen and independently examined with Thom Sluder, PA .   We discussed all aspects of the encounter. I agree with the assessment and plan as stated above.    Acute on chronic CHF:   better diuresis with the addition of metolazone  .    Her prognosis remains poor  She has been seen by hospice    Cardiology will sign off.[facility] will sign off.   Medication Recommendations:  cont current meds. Transition to comfort care when appropriate  Other recommendations (labs, testing, etc):   Follow up as an outpatient:      Aleene DOROTHA Passe Mickey., MD, FACC 05/08/2023, 8:10 AM [address]  [address] Office [phone] Pager [phone]

## 2023-05-09 ENCOUNTER — Inpatient Hospital Stay (HOSPITAL_COMMUNITY): Payer: Medicare Other

## 2023-05-09 DIAGNOSIS — I5033 Acute on chronic diastolic (congestive) heart failure: Secondary | ICD-10-CM | POA: Diagnosis not present

## 2023-05-09 LAB — BASIC METABOLIC PANEL
Anion gap: 9 (ref 5–15)
BUN: 67 mg/dL — ABNORMAL HIGH (ref 8–23)
CO2: 32 mmol/L (ref 22–32)
Calcium: 8.6 mg/dL — ABNORMAL LOW (ref 8.9–10.3)
Chloride: 85 mmol/L — ABNORMAL LOW (ref 98–111)
Creatinine, Ser: 1.32 mg/dL — ABNORMAL HIGH (ref 0.44–1.00)
GFR, Estimated: 37 mL/min — ABNORMAL LOW (ref >60)
Glucose, Bld: 84 mg/dL (ref 70–99)
Potassium: 4 mmol/L (ref 3.5–5.1)
Sodium: 126 mmol/L — ABNORMAL LOW (ref 135–145)

## 2023-05-09 LAB — BRAIN NATRIURETIC PEPTIDE: B Natriuretic Peptide: 593.6 pg/mL — ABNORMAL HIGH (ref 0.0–100.0)

## 2023-05-09 LAB — MAGNESIUM: Magnesium: 1.9 mg/dL (ref 1.7–2.4)

## 2023-05-09 MED ORDER — ALBUTEROL SULFATE (2.5 MG/3ML) 0.083% IN NEBU
2.5000 mg | INHALATION_SOLUTION | Freq: Once | RESPIRATORY_TRACT | Status: AC
  Administered 2023-05-09: 2.5 mg via RESPIRATORY_TRACT
  Filled 2023-05-09: qty 3

## 2023-05-09 MED ORDER — GUAIFENESIN-DM 100-10 MG/5ML PO SYRP
5.0000 mL | ORAL_SOLUTION | ORAL | Status: DC | PRN
  Administered 2023-05-09 – 2023-05-11 (×4): 5 mL via ORAL
  Filled 2023-05-09 (×3): qty 10

## 2023-05-09 NOTE — TOC Initial Note (Addendum)
 Transition of Care [facility]) - Initial/Assessment Note    Patient Details  Name: Kimberly [name] MRN: [id number] Date of Birth: [birth year]  Transition of Care [facility]) CM/SW Contact:    Sonda Manuella Quill, RN Phone Number: 05/09/2023, 3:49 PM  Clinical Narrative:                 TOC for d/c planning; called pt's POC dtr [name] Molt [phone]); she says pt resides at [facility]; she plans for her to return at d/c; she verified insurance/PCP; she denies pt experiencing SDOH risks; she says pt has glasses, upper dentures, and a wheelchair; spoke w/ YASMIN, med tech at facility; she confirms residency; she also says pt needs assist w/ ADLs; AJ also says pt is transported to facility by ambulance; per pall care note may transition to comfort care; TOC will follow.  Expected Discharge Plan: Skilled Nursing Facility Barriers to Discharge: Continued Medical Work up   Patient Goals and CMS Choice Patient states their goals for this hospitalization and ongoing recovery are:: patent will return to [place] per her [name] Molt CMS Medicare.gov Compare Post Acute Care list provided to:: Patient Represenative (must comment) Elray Molt (dtr))   Mena ownership interest in [facility].provided to:: Adult Children    Expected Discharge Plan and Services   Discharge Planning Services: CM Consult   Living arrangements for the past 2 months: Skilled Nursing Facility                                      Prior Living Arrangements/Services Living arrangements for the past 2 months: Skilled Nursing Facility Lives with:: Facility Resident Patient language and need for interpreter reviewed:: Yes Do you feel safe going back to the place where you live?: Yes      Need for Family Participation in Patient Care: Yes (Comment) Care giver support system in place?: Yes (comment) Current home services: DME (wheel chair) Criminal Activity/Legal Involvement Pertinent to Current  Situation/Hospitalization: No - Comment as needed  Activities of Daily Living   ADL Screening (condition at time of admission) Independently performs ADLs?: No Does the patient have a NEW difficulty with bathing/dressing/toileting/self-feeding that is expected to last >3 days?: No Does the patient have a NEW difficulty with getting in/out of bed, walking, or climbing stairs that is expected to last >3 days?: No Does the patient have a NEW difficulty with communication that is expected to last >3 days?: No Is the patient deaf or have difficulty hearing?: Yes Does the patient have difficulty seeing, even when wearing glasses/contacts?: Yes Does the patient have difficulty concentrating, remembering, or making decisions?: Yes  Permission Sought/Granted Permission sought to share information with : Case Manager Permission granted to share information with : Yes, Verbal Permission Granted  Share Information with NAME: Case Manager     Permission granted to share info w Relationship: [name] Molt (dtr) [phone]     Emotional Assessment Appearance:: Other (Comment Required (unable to assess)   Affect (typically observed): Unable to Assess Orientation: :  (unable to assess) Alcohol  / Substance Use: Not Applicable Psych Involvement: No (comment)  Admission diagnosis:  CHF (congestive heart failure) (HCC) [I50.9] Gastrointestinal hemorrhage, unspecified gastrointestinal hemorrhage type [K92.2] Acute on chronic congestive heart failure, unspecified heart failure type [facility]) [I50.9] Patient Active Problem List   Diagnosis Date Noted   Acute on chronic diastolic CHF (congestive heart failure) (HCC) 05/05/2023  CHF (congestive heart failure) (HCC) 05/03/2023   DVT (deep venous thrombosis) (HCC) 10/24/2021   Paroxysmal atrial fibrillation (HCC) 10/24/2021   Hyponatremia 10/23/2021   UTI (urinary tract infection) 10/20/2021   Acute esophagitis    Occult GI bleeding 09/30/2021    Alternating constipation and diarrhea 07/26/2020   Gastroesophageal reflux disease 07/26/2020   Dark stools 07/26/2020   Pacemaker battery depletion    Dyspnea 06/17/2020   Chest pain 06/17/2020   Finger dislocation, initial encounter 11/16/2018   Hand trauma, left, initial encounter 11/16/2018   Acute blood loss anemia 10/25/2018   Rectal bleeding 10/23/2018   Acute GI bleeding    Enteritis of small intestine due to enterotoxigenic Escherichia coli associated with diarrhea 03/24/2017   Bipolar affective disorder (HCC) 03/23/2017   Olfactory hallucinations 03/23/2017   Lower GI bleed    Glaucoma 02/28/2017   Pacemaker 03/21/2015   Non-ischemic cardiomyopathy (HCC) 11/28/2014   PUD (peptic ulcer disease) 06/14/2014   History of bipolar disorder    Hypokalemia 05/26/2014   Fall at home 05/26/2014   HFpEF (heart failure with preverved EF) 11/15/2013   Complete heart block (HCC) 11/15/2013   Inability to ambulate due to ankle or foot 09/05/2013   Constipation 08/26/2013   Anemia 08/25/2013   Acute diverticulitis 08/24/2013   CKD (chronic kidney disease) stage 3, GFR 30-59 ml/min (HCC) 10/12/2012   Chronic anticoagulation 10/12/2012   Osteoarthritis    Permanent atrial fibrillation    Chronic diastolic CHF (congestive heart failure) (HCC)    Fibromyalgia    Essential hypertension    Depression    PCP:  Renato Dorothey HERO, NP Pharmacy:   [facility] - [place], [place] - [address] [place] [address] Phone: [phone] Fax: [phone]     Social Drivers of Health (SDOH) Social History: SDOH Screenings   Food Insecurity: No Food Insecurity (05/09/2023)  Housing: Low Risk  (05/09/2023)  Transportation Needs: No Transportation Needs (05/09/2023)  Utilities: Not At Risk (05/09/2023)  Alcohol  Screen: Low Risk  (10/22/2018)  Depression (PHQ2-9): Medium Risk (12/13/2018)  Financial Resource Strain: Low Risk  (01/06/2023)    [facility]  Physical Activity: Inactive (08/15/2019)  Social Connections: Socially Isolated (05/06/2023)  Stress: No Stress Concern Present (10/22/2018)  Tobacco Use: Medium Risk (05/03/2023)  Health Literacy: Medium Risk (01/06/2023)   Received from [facility]   SDOH Interventions: Food Insecurity Interventions: Intervention Not Indicated, Inpatient TOC Transportation Interventions: Intervention Not Indicated, Inpatient TOC Utilities Interventions: Intervention Not Indicated, Inpatient TOC   Readmission Risk Interventions    05/09/2023    3:46 PM  Readmission Risk Prevention Plan  Transportation Screening Complete  PCP or Specialist Appt within 5-7 Days Complete  Home Care Screening Complete  Medication Review (RN CM) Complete

## 2023-05-09 NOTE — Progress Notes (Signed)
 PROGRESS NOTE    Kimberly [name]  FMW:[id number]  DOB: [birth year]  DOA: 05/03/2023 PCP: Renato Dorothey HERO, NP Outpatient Specialists:   Hospital course:  89-year-old female with HFpEF EF 50%, A-fib, HTN, bipolar illness was admitted with decompensated heart failure and bilateral lower extremity weeping.  Workup revealed hyponatremia sodium of 127 and AKI with creatinine of 1.2.  BNP was 600.  Chest x-ray showed cardiomegaly and pulmonary edema.  Patient has been treated with diuresis, cardiology is following as is palliative care.   Subjective:  Patient states her continued cough is still bothering her.  Notes she is coughing up boogers, shows me dark yellow phlegm.  Attentive daughter at bedside without new concerns, she will be returning to [place]  today.  She is aware that although her mother appears to be improving she still very tenuous and may take a turn for the worse.  Patient's daughter states that she nursed her father and in-laws through their last days as well.    Objective: Vitals:   05/08/23 1957 05/09/23 0500 05/09/23 0526 05/09/23 1458  BP: (!) 122/53  (!) 120/57 (!) 140/59  Pulse: (!) 59  63 61  Resp: 20  15 18   Temp: (!) 97.5 F (36.4 C)  (!) 97.3 F (36.3 C) 97.6 F (36.4 C)  TempSrc:   Oral Oral  SpO2: 100%  99% 100%  Weight:  60.2 kg    Height:        Intake/Output Summary (Last 24 hours) at 05/09/2023 1711 Last data filed at 05/09/2023 1500 Gross per 24 hour  Intake 480 ml  Output --  Net 480 ml   Filed Weights   05/07/23 1033 05/08/23 0500 05/09/23 0500  Weight: 64 kg 60.9 kg 60.2 kg     Exam:  General: Frail, chronically ill-appearing female appearing stated age sitting on side of bed eating breakfast in no respiratory distress Eyes: sclera anicteric, conjuctiva mild injection bilaterally CVS: S1-S2, regular  Respiratory:  decreased air entry bilaterally secondary to decreased inspiratory effort, rales at bases  GI: NABS, soft, NT  LE:  No lower extremity edema, no erythema    Data Reviewed:  Basic Metabolic Panel: Recent Labs  Lab 05/05/23 0803 05/05/23 0930 05/06/23 0328 05/07/23 0337 05/08/23 0518 05/09/23 0607  NA 126*  --  127* 126* 127* 126*  K 3.7  --  4.3 5.5* 4.0 4.0  CL 91*  --  90* 89* 87* 85*  CO2 27  --  26 25 28  32  GLUCOSE 89  --  104* 76 81 84  BUN 46*  --  52* 55* 65* 67*  CREATININE 1.21*  --  1.73* 1.78* 1.44* 1.32*  CALCIUM  8.1*  --  8.3* 8.4* 8.6* 8.6*  MG  --  2.1  --   --   --  1.9    CBC: Recent Labs  Lab 05/04/23 0705 05/05/23 0930 05/06/23 0448 05/07/23 0337 05/08/23 0518  WBC 4.9 4.2 10.7* 6.4 6.4  HGB 7.9* 8.2* 7.9* 8.2* 8.0*  HCT 24.3* 26.0* 25.0* 26.2* 25.1*  MCV 82.7 83.9 83.3 84.2 82.8  PLT 177 172 170 166 175     Scheduled Meds:  apixaban   2.5 mg Oral BID   Chlorhexidine  Gluconate Cloth  6 each Topical Daily   vitamin B-12  1,000 mcg Oral Daily   doxycycline   100 mg Oral Q12H   feeding supplement  237 mL Oral BID BM   mirtazapine   15 mg Oral QHS   pantoprazole   40 mg Oral Daily   sertraline   50 mg Oral Daily   traZODone   50 mg Oral QHS   Continuous Infusions:   Assessment & Plan:   Decompensated HFpEF versus low output heart failure Hyponatremia Acute on CKD Relative hypotension--now improved Patient has been tolerating diuresis however has had some worsening of creatinine Cardiology continues to follow and they note that she is declining likely has low output heart failure with low MAP and hypothermia.They had recommended palliative care evaluation possible comfort care. Cardiology is managing daily diuretic doses including metolazone  which seems to be effective. Renal function continues to improve  Cough  Bronchitis Chest x-ray without any consolidation, trace vascular edema Continue doxycycline   Bipolar disorder Insomnia and depression Patient's daughter states that she thinks that her mother might be spiraling up into a manic or hypomanic  state She notes patient does not have a psychiatrist that follows this Will need to follow patient closely Continue Remeron , Zoloft  and trazodone   Goals for care Very much appreciate palliative care consultation who spoke with [name] who is at bedside today [name] does understand that her mother has a poor prognosis and her mother appears to be excepting this as well Continue present therapeutic plan however can consider transition to comfort care if patient does not improve  Hypothermia Possibly secondary to low output heart failure No evidence for infection TSH only minimally elevated  Atrial fibrillation High-grade AV block s/p PPM Ventricular paced in the 60s Continue Eliquis  for now    DVT prophylaxis: Eliquis  Code Status: DNR Family Communication: Patient's daughter was at bedside throughout     Studies: DG CHEST PORT 1 VIEW Result Date: 05/09/2023 CLINICAL DATA:  CHF EXAM: PORTABLE CHEST 1 VIEW COMPARISON:  X-ray 05/03/2023. FINDINGS: Enlarged cardiopericardial silhouette with calcified aorta. Vascular congestion and some trace edema. Bilateral pleural effusions also seen. Left upper chest defibrillator. Calcified aorta. Fixation hardware along the lower cervical spine. Bilateral apical pleural thickening as well as some pleural calcification. Film is under penetrated. IMPRESSION: Enlarged heart with defibrillator. Vascular congestion with trace edema. Increasing small pleural effusions. Recommend follow-up. Electronically Signed   By: Ranell Bring M.D.   On: 05/09/2023 14:56    Principal Problem:   CHF (congestive heart failure) (HCC) Active Problems:   Acute on chronic diastolic CHF (congestive heart failure) (HCC)     Elizebath Wever Tublu Latanza Pfefferkorn, [facility]  If 7PM-7AM, please contact night-coverage [email or web address]   LOS: 6 days

## 2023-05-09 NOTE — Plan of Care (Signed)

## 2023-05-09 NOTE — Progress Notes (Signed)
 Patient c/o chest congestion and coughing up phlegm. Patient given PRN cough syrup and given breathing treatment. Patient found on RA, placed back on 3L charted previously.

## 2023-05-10 DIAGNOSIS — I5043 Acute on chronic combined systolic (congestive) and diastolic (congestive) heart failure: Secondary | ICD-10-CM | POA: Diagnosis not present

## 2023-05-10 DIAGNOSIS — I5033 Acute on chronic diastolic (congestive) heart failure: Secondary | ICD-10-CM | POA: Diagnosis not present

## 2023-05-10 DIAGNOSIS — Z7189 Other specified counseling: Secondary | ICD-10-CM | POA: Diagnosis not present

## 2023-05-10 DIAGNOSIS — Z515 Encounter for palliative care: Secondary | ICD-10-CM | POA: Diagnosis not present

## 2023-05-10 LAB — GLUCOSE, CAPILLARY
Glucose-Capillary: 88 mg/dL (ref 70–99)
Glucose-Capillary: 97 mg/dL (ref 70–99)

## 2023-05-10 NOTE — Plan of Care (Signed)
  Problem: Clinical Measurements: Goal: Ability to maintain clinical measurements within normal limits will improve Outcome: Progressing Goal: Will remain free from infection Outcome: Progressing Goal: Diagnostic test results will improve Outcome: Progressing Goal: Respiratory complications will improve Outcome: Progressing Goal: Cardiovascular complication will be avoided Outcome: Progressing   Problem: Activity: Goal: Risk for activity intolerance will decrease Outcome: Progressing   Problem: Nutrition: Goal: Adequate nutrition will be maintained Outcome: Progressing   Problem: Coping: Goal: Level of anxiety will decrease Outcome: Progressing   Problem: Elimination: Goal: Will not experience complications related to bowel motility Outcome: Progressing Goal: Will not experience complications related to urinary retention Outcome: Progressing   Problem: Pain Management: Goal: General experience of comfort will improve Outcome: Progressing   Problem: Education: Goal: Knowledge of General Education information will improve Description: Including pain rating scale, medication(s)/side effects and non-pharmacologic comfort measures Outcome: Not Progressing   Problem: Health Behavior/Discharge Planning: Goal: Ability to manage health-related needs will improve Outcome: Not Progressing

## 2023-05-10 NOTE — Progress Notes (Signed)
 Palliative:  HPI: 89 y.o. female  with past medical history of HFpEF, atrial fibrillation, hypertension, bipolar admitted on 05/03/2023 with dizziness, shortness of breath, leg swelling due to heart failure exacerbation. Hospitalization complicated by worsening renal failure.   I met today with Ms. Ehly. No family at bedside. She is sitting up in recliner. She has reordered her meal as it was cold. She tells me that she is feeling much better. She tells me that she is hopeful to return to her ALF soon to be with her friends. She does recall her overall poor prognosis (although forgetful of the details). She had a good visit with her daughter over the weekend. We reviewed her poor prognosis and although she is accepting she also shares that she would be willing to return to the hospital to see if anything more can be done. She is hopeful that she can ultimately move closer to her daughter to spend more time with family.   If kidney function remains stable with diuresis for transition back to ALF would recommend outpatient palliative to follow. Low threshold to consider hospice services with further decline.   All questions/concerns addressed. Emotional support provided.    Exam: Alert but forgetful about health details. No distress. Breathing regular, unlabored with oxygen . Abd flat. Moves all extremities.   Plan: - DNR - Hopeful for return to ALF soon - recommend outpatient palliative to follow - Low threshold for transition to comfort and hospice with further decline  25 min  Bernarda Kitty, NP Palliative Medicine Team Pager [phone] (Please see [email or web address] for schedule) Team Phone [phone]    Greater than 50%  of this time was spent counseling and coordinating care related to the above assessment and plan

## 2023-05-10 NOTE — Plan of Care (Signed)

## 2023-05-10 NOTE — Progress Notes (Signed)
 PROGRESS NOTE    Kimberly [name]  FMW:[id number]  DOB: [birth year]  DOA: 05/03/2023 PCP: Renato Dorothey HERO, NP Outpatient Specialists:   Hospital course:  89 year old female with HFpEF EF 50%, A-fib, HTN, bipolar illness was admitted with decompensated heart failure and bilateral lower extremity weeping.  Workup revealed hyponatremia sodium of 127 and AKI with creatinine of 1.2.  BNP was 600.  Chest x-ray showed cardiomegaly and pulmonary edema.  Patient has been treated with diuresis, cardiology is following as is palliative care.   Subjective:  Patient states that she feels well.  Cough is much better.  Has less phlegm.    Objective: Vitals:   05/09/23 2024 05/10/23 0500 05/10/23 0615 05/10/23 1231  BP: (!) 119/24  (!) 149/60 (!) 148/79  Pulse: (!) 59  60 62  Resp: 16  18   Temp: 97.9 F (36.6 C)  97.6 F (36.4 C) (!) 97.4 F (36.3 C)  TempSrc: Oral  Oral Oral  SpO2: 98%  (!) 89% 100%  Weight:  60.6 kg    Height:        Intake/Output Summary (Last 24 hours) at 05/10/2023 1633 Last data filed at 05/10/2023 1100 Gross per 24 hour  Intake 120 ml  Output 850 ml  Net -730 ml   Filed Weights   05/08/23 0500 05/09/23 0500 05/10/23 0500  Weight: 60.9 kg 60.2 kg 60.6 kg     Exam:  General: Frail, chronically ill-appearing female appearing stated age sitting on side of bed eating breakfast in no respiratory distress Eyes: sclera anicteric, conjuctiva mild injection bilaterally CVS: S1-S2, regular  Respiratory:  decreased air entry bilaterally secondary to decreased inspiratory effort, rales at bases  GI: NABS, soft, NT  LE: No lower extremity edema, no erythema    Data Reviewed:  Basic Metabolic Panel: Recent Labs  Lab 05/05/23 0803 05/05/23 0930 05/06/23 0328 05/07/23 0337 05/08/23 0518 05/09/23 0607  NA 126*  --  127* 126* 127* 126*  K 3.7  --  4.3 5.5* 4.0 4.0  CL 91*  --  90* 89* 87* 85*  CO2 27  --  26 25 28  32  GLUCOSE 89  --  104* 76 81 84   BUN 46*  --  52* 55* 65* 67*  CREATININE 1.21*  --  1.73* 1.78* 1.44* 1.32*  CALCIUM  8.1*  --  8.3* 8.4* 8.6* 8.6*  MG  --  2.1  --   --   --  1.9    CBC: Recent Labs  Lab 05/04/23 0705 05/05/23 0930 05/06/23 0448 05/07/23 0337 05/08/23 0518  WBC 4.9 4.2 10.7* 6.4 6.4  HGB 7.9* 8.2* 7.9* 8.2* 8.0*  HCT 24.3* 26.0* 25.0* 26.2* 25.1*  MCV 82.7 83.9 83.3 84.2 82.8  PLT 177 172 170 166 175     Scheduled Meds:  apixaban   2.5 mg Oral BID   Chlorhexidine  Gluconate Cloth  6 each Topical Daily   vitamin B-12  1,000 mcg Oral Daily   doxycycline   100 mg Oral Q12H   feeding supplement  237 mL Oral BID BM   mirtazapine   15 mg Oral QHS   pantoprazole   40 mg Oral Daily   sertraline   50 mg Oral Daily   traZODone   50 mg Oral QHS   Continuous Infusions:   Assessment & Plan:   Decompensated HFpEF versus low output heart failure--improved Hyponatremia Acute on CKD Relative hypotension--now improved Patient has diuresed well under very careful daily recommendations by cardiology Renal function continues to  improve Initially,Cardiology did note that she is declining likely has low output heart failure with low MAP and hypothermia and had recommended palliative care evaluation possible comfort care.  Cough  Bronchitis--improving Chest x-ray without any consolidation, trace vascular edema Continue doxycycline   Bipolar disorder Insomnia and depression Patient's daughter states that she thinks that her mother might be spiraling up into a manic or hypomanic state She notes patient does not have a psychiatrist that follows this Will need to follow patient closely Continue Remeron , Zoloft  and trazodone   Goals for care Very much appreciate palliative care consultation who spoke with [name] who has just gone back to her home in [place]  [name] does understand that her mother has a poor prognosis and her mother appears to be excepting this as well Continue present therapeutic  plan however can consider transition to comfort care if patient does not improve  Hypothermia Possibly secondary to low output heart failure No evidence for infection TSH only minimally elevated  Atrial fibrillation High-grade AV block s/p PPM Ventricular paced in the 60s Continue Eliquis  for now    DVT prophylaxis: Eliquis  Code Status: DNR Family Communication: Patient's daughter was at bedside throughout     Studies: DG CHEST PORT 1 VIEW Result Date: 05/09/2023 CLINICAL DATA:  CHF EXAM: PORTABLE CHEST 1 VIEW COMPARISON:  X-ray 05/03/2023. FINDINGS: Enlarged cardiopericardial silhouette with calcified aorta. Vascular congestion and some trace edema. Bilateral pleural effusions also seen. Left upper chest defibrillator. Calcified aorta. Fixation hardware along the lower cervical spine. Bilateral apical pleural thickening as well as some pleural calcification. Film is under penetrated. IMPRESSION: Enlarged heart with defibrillator. Vascular congestion with trace edema. Increasing small pleural effusions. Recommend follow-up. Electronically Signed   By: Ranell Bring M.D.   On: 05/09/2023 14:56    Principal Problem:   CHF (congestive heart failure) (HCC) Active Problems:   Acute on chronic diastolic CHF (congestive heart failure) (HCC)     Suhey Radford Tublu Rosella Crandell, [facility]  If 7PM-7AM, please contact night-coverage [email or web address]   LOS: 7 days

## 2023-05-10 NOTE — Plan of Care (Signed)

## 2023-05-11 DIAGNOSIS — I5033 Acute on chronic diastolic (congestive) heart failure: Secondary | ICD-10-CM | POA: Diagnosis not present

## 2023-05-11 LAB — CBC
HCT: 23.5 % — ABNORMAL LOW (ref 36.0–46.0)
Hemoglobin: 7.7 g/dL — ABNORMAL LOW (ref 12.0–15.0)
MCH: 26.2 pg (ref 26.0–34.0)
MCHC: 32.8 g/dL (ref 30.0–36.0)
MCV: 79.9 fL — ABNORMAL LOW (ref 80.0–100.0)
Platelets: 180 10*3/uL (ref 150–400)
RBC: 2.94 MIL/uL — ABNORMAL LOW (ref 3.87–5.11)
RDW: 18.5 % — ABNORMAL HIGH (ref 11.5–15.5)
WBC: 6.5 10*3/uL (ref 4.0–10.5)
nRBC: 0.6 % — ABNORMAL HIGH (ref 0.0–0.2)

## 2023-05-11 LAB — BASIC METABOLIC PANEL
Anion gap: 11 (ref 5–15)
BUN: 73 mg/dL — ABNORMAL HIGH (ref 8–23)
CO2: 29 mmol/L (ref 22–32)
Calcium: 9 mg/dL (ref 8.9–10.3)
Chloride: 90 mmol/L — ABNORMAL LOW (ref 98–111)
Creatinine, Ser: 1.28 mg/dL — ABNORMAL HIGH (ref 0.44–1.00)
GFR, Estimated: 39 mL/min — ABNORMAL LOW (ref >60)
Glucose, Bld: 87 mg/dL (ref 70–99)
Potassium: 3.8 mmol/L (ref 3.5–5.1)
Sodium: 130 mmol/L — ABNORMAL LOW (ref 135–145)

## 2023-05-11 MED ORDER — BISACODYL 10 MG RE SUPP
10.0000 mg | Freq: Once | RECTAL | Status: AC
  Administered 2023-05-11: 10 mg via RECTAL
  Filled 2023-05-11: qty 1

## 2023-05-11 MED ORDER — SENNA 8.6 MG PO TABS
1.0000 | ORAL_TABLET | Freq: Every day | ORAL | Status: DC
  Administered 2023-05-11: 8.6 mg via ORAL
  Filled 2023-05-11: qty 1

## 2023-05-11 MED ORDER — POLYETHYLENE GLYCOL 3350 17 G PO PACK
17.0000 g | PACK | Freq: Two times a day (BID) | ORAL | Status: DC
  Administered 2023-05-11 – 2023-05-12 (×2): 17 g via ORAL
  Filled 2023-05-11 (×2): qty 1

## 2023-05-11 MED ORDER — CARMEX CLASSIC LIP BALM EX OINT
TOPICAL_OINTMENT | CUTANEOUS | Status: DC | PRN
  Administered 2023-05-11: 1 via TOPICAL
  Filled 2023-05-11: qty 10

## 2023-05-11 NOTE — Hospital Course (Addendum)
 good morning, appears doxycycline  started on 12/30 for concern for cellulitis. however, notes the last 3 days do not mention cellulitis (only bronchitis). ? stop antibiotics after this AM's dose to complete 7 day course? thank you   89 yo frail elderly patient was admitted with decompensated HFpEF and guaiac positive stool.  Initially cardiology thought that she was having terminal heart failure and palliative care got involved and patient was made DNR but not comfort care.  Cardiology had been following her closely and have actually managed to diurese her well with actually improving kidney function.  Patient can be discharged once cardiology signs off on her.  I think she lives in assisted living, unclear if she can go back there or if needs to go to SNF.  [name] just went back to her home in [place] , very involved.  She knows that her mother might take a turn for the worse very easily.  Right now she looks good.   89 year old female with HFpEF EF 50%, A-fib, HTN, bipolar illness was admitted with decompensated heart failure and bilateral lower extremity weeping. Workup revealed hyponatremia sodium of 127 and AKI with creatinine of 1.2. BNP was 600. Chest x-ray showed cardiomegaly and pulmonary edema. Patient has been treated with diuresis, cardiology is following as is palliative care.   Consultants ***  Procedures/Events ***

## 2023-05-11 NOTE — TOC Progression Note (Addendum)
 Transition of Care [facility]) - Progression Note    Patient Details  Name: Kimberly [name] MRN: [id number] Date of Birth: [birth year]  Transition [facility]) CM/SW Contact  Deion Forgue, [place], RN Phone Number: 05/11/2023, 11:48 AM  Clinical Narrative: From [facility] [place] for return-spok to [facility] will fax fl2 once signed to fax#[phone].PTAR @ d/c.   -1p faxed w/confirmation to [facility]    Expected Discharge Plan: Skilled Nursing Facility Barriers to Discharge: Continued Medical Work up  Expected Discharge Plan and Services   Discharge Planning Services: CM Consult   Living arrangements for the past 2 months: Skilled Nursing Facility                                       Social Determinants of Health (SDOH) Interventions SDOH Screenings   Food Insecurity: No Food Insecurity (05/09/2023)  Housing: Low Risk  (05/09/2023)  Transportation Needs: No Transportation Needs (05/09/2023)  Utilities: Not At Risk (05/09/2023)  Alcohol  Screen: Low Risk  (10/22/2018)  Depression (PHQ2-9): Medium Risk (12/13/2018)  Financial Resource Strain: Low Risk  (01/06/2023)   Received from [facility]  Physical Activity: Inactive (08/15/2019)  Social Connections: Socially Isolated (05/06/2023)  Stress: No Stress Concern Present (10/22/2018)  Tobacco Use: Medium Risk (05/03/2023)  Health Literacy: Medium Risk (01/06/2023)   Received from [facility]    Readmission Risk Interventions    05/09/2023    3:46 PM  Readmission Risk Prevention Plan  Transportation Screening Complete  PCP or Specialist Appt within 5-7 Days Complete  Home Care Screening Complete  Medication Review (RN CM) Complete

## 2023-05-11 NOTE — Progress Notes (Signed)
  Progress Note   Patient: Kimberly [name] FMW:[id number] DOB: [birth year] DOA: 05/03/2023     8 DOS: the patient was seen and examined on 05/11/2023   Brief hospital course:  Assessment and Plan: Decompensated HFpEF versus low output heart failure Hyponatremia Acute on CKD Relative hypotension Appears stable now and compensated.  Not currently on any diuretics.  Monitor volume status.  Constipation  Acute/chronic lower abd pain Bowel regimen.  Bronchitis--improving Chest x-ray without any consolidation, trace vascular edema he completed doxycycline .   Bipolar disorder Insomnia and depression Appear stable. Continue Remeron , Zoloft  and trazodone    Goals for care Very much appreciate palliative care consultation who spoke with [name] who has just gone back to her home in [place]  [name] does understand that her mother has a poor prognosis and her mother appears to be excepting this as well Continue present therapeutic plan however can consider transition to comfort care if patient does not improve   Hypothermia -- resolved Possibly secondary to low output heart failure No evidence for infection TSH only minimally elevated   Atrial fibrillation High-grade AV block s/p PPM Ventricular paced in the 60s Continue Eliquis       Subjective:  Breathing ok Has chronic RLQ pain, but now general lower abd pain Chronic constipation, doesn't like to use laxatives  Physical Exam: Vitals:   05/10/23 1636 05/10/23 2124 05/11/23 0500 05/11/23 0543  BP:  (!) 130/52  (!) 143/75  Pulse:  60  61  Resp:  20  16  Temp:  97.9 F (36.6 C)  (!) 97.5 F (36.4 C)  TempSrc:    Oral  SpO2: 92% 93%  95%  Weight:   60.4 kg   Height:       Physical Exam Vitals reviewed.  Constitutional:      General: She is not in acute distress.    Appearance: She is not ill-appearing or toxic-appearing.  Cardiovascular:     Rate and Rhythm: Normal rate and regular rhythm.     Heart sounds: No  murmur heard. Pulmonary:     Effort: Pulmonary effort is normal. No respiratory distress.     Breath sounds: No wheezing, rhonchi or rales.  Skin:    Comments: Chronic venous stasis changes BLE  Neurological:     Mental Status: She is alert.  Psychiatric:        Mood and Affect: Mood normal.        Behavior: Behavior normal.     Data Reviewed: Creatinine down to 1.28 Hgb stable 7.7  Family Communication:   Disposition: Status is: Inpatient Remains inpatient appropriate because: above     Time spent: 35 minutes  Author: Toribio Door, MD 05/11/2023 10:12 AM  For on call review [email or web address].

## 2023-05-11 NOTE — NC FL2 (Signed)
 [place]  MEDICAID FL2 LEVEL OF CARE FORM     IDENTIFICATION  Patient Name: Kimberly [name] Birthdate: April 05, 1927 Sex: female Admission Date (Current Location): 05/03/2023  [facility] and [place] Number:  Producer, Television/film/video and Address:  [facility],  [address], [place] [address]      Provider Number: [id number]  Attending Physician Name and Address:  Jadine Toribio SQUIBB, MD  Relative Name and Phone Number:  karen [phone]    Current Level of Care: Hospital Recommended Level of Care: Assisted Living Facility Prior Approval Number:    Date Approved/Denied:   PASRR Number:    Discharge Plan: Other (Comment) (ALF)    Current Diagnoses: Patient Active Problem List   Diagnosis Date Noted   Acute on chronic diastolic CHF (congestive heart failure) (HCC) 05/05/2023   CHF (congestive heart failure) (HCC) 05/03/2023   DVT (deep venous thrombosis) (HCC) 10/24/2021   Paroxysmal atrial fibrillation (HCC) 10/24/2021   Hyponatremia 10/23/2021   UTI (urinary tract infection) 10/20/2021   Acute esophagitis    Occult GI bleeding 09/30/2021   Alternating constipation and diarrhea 07/26/2020   Gastroesophageal reflux disease 07/26/2020   Dark stools 07/26/2020   Pacemaker battery depletion    Dyspnea 06/17/2020   Chest pain 06/17/2020   Finger dislocation, initial encounter 11/16/2018   Hand trauma, left, initial encounter 11/16/2018   Acute blood loss anemia 10/25/2018   Rectal bleeding 10/23/2018   Acute GI bleeding    Enteritis of small intestine due to enterotoxigenic Escherichia coli associated with diarrhea 03/24/2017   Bipolar affective disorder (HCC) 03/23/2017   Olfactory hallucinations 03/23/2017   Lower GI bleed    Glaucoma 02/28/2017   Pacemaker 03/21/2015   Non-ischemic cardiomyopathy (HCC) 11/28/2014   PUD (peptic ulcer disease) 06/14/2014   History of bipolar disorder    Hypokalemia 05/26/2014   Fall at home 05/26/2014   HFpEF  (heart failure with preverved EF) 11/15/2013   Complete heart block (HCC) 11/15/2013   Inability to ambulate due to ankle or foot 09/05/2013   Constipation 08/26/2013   Anemia 08/25/2013   Acute diverticulitis 08/24/2013   CKD (chronic kidney disease) stage 3, GFR 30-59 ml/min (HCC) 10/12/2012   Chronic anticoagulation 10/12/2012   Osteoarthritis    Permanent atrial fibrillation    Chronic diastolic CHF (congestive heart failure) (HCC)    Fibromyalgia    Essential hypertension    Depression     Orientation RESPIRATION BLADDER Height & Weight     Self, Situation, Time  Normal Continent Weight: 60.4 kg Height:  5' 7 (170.2 cm)  BEHAVIORAL SYMPTOMS/MOOD NEUROLOGICAL BOWEL NUTRITION STATUS      Continent Diet (Regular)  AMBULATORY STATUS COMMUNICATION OF NEEDS Skin   Limited Assist Verbally                         Personal Care Assistance Level of Assistance  Bathing, Feeding, Dressing Bathing Assistance: Limited assistance Feeding assistance: Limited assistance Dressing Assistance: Limited assistance     Functional Limitations Info  Sight, Hearing, Speech Sight Info: Impaired (eyeglasses) Hearing Info: Adequate Speech Info: Impaired (Dentures-top/bottom)    SPECIAL CARE FACTORS FREQUENCY  PT (By licensed PT), OT (By licensed OT)     PT Frequency: 2x week OT Frequency: 2x week            Contractures Contractures Info: Not present    Additional Factors Info  Code Status, Allergies Code Status Info: DNR Allergies Info: Penicillins,  Ceftriaxone , Camphor, Ciprofloxacin , Flagyl  (Metronidazole ), Papaya Derivatives, Iodine, Sulfa Antibiotics           Current Medications (05/11/2023):  This is the current hospital active medication list Current Facility-Administered Medications  Medication Dose Route Frequency Provider Last Rate Last Admin   acetaminophen  (TYLENOL ) tablet 650 mg  650 mg Oral Q6H PRN Regalado, Belkys A, MD   650 mg at 05/10/23 2216   Or    acetaminophen  (TYLENOL ) suppository 650 mg  650 mg Rectal Q6H PRN Regalado, Belkys A, MD       apixaban  (ELIQUIS ) tablet 2.5 mg  2.5 mg Oral BID Regalado, Belkys A, MD   2.5 mg at 05/11/23 0853   bisacodyl  (DULCOLAX) suppository 10 mg  10 mg Rectal Once Goodrich, Daniel P, MD       Chlorhexidine  Gluconate Cloth 2 % PADS 6 each  6 each Topical Daily Regalado, Belkys A, MD   6 each at 05/10/23 0903   cyanocobalamin  (VITAMIN B12) tablet 1,000 mcg  1,000 mcg Oral Daily Regalado, Belkys A, MD   1,000 mcg at 05/11/23 0853   feeding supplement (ENSURE ENLIVE / ENSURE PLUS) liquid 237 mL  237 mL Oral BID BM Chatterjee, Srobona Tublu, MD   237 mL at 05/11/23 0854   guaiFENesin -dextromethorphan  (ROBITUSSIN DM) 100-10 MG/5ML syrup 5 mL  5 mL Oral Q4H PRN Chatterjee, Srobona Tublu, MD   5 mL at 05/10/23 2216   mirtazapine  (REMERON ) tablet 15 mg  15 mg Oral QHS Regalado, Belkys A, MD   15 mg at 05/10/23 2216   ondansetron  (ZOFRAN ) tablet 4 mg  4 mg Oral Q6H PRN Regalado, Belkys A, MD       Or   ondansetron  (ZOFRAN ) injection 4 mg  4 mg Intravenous Q6H PRN Regalado, Belkys A, MD   4 mg at 05/05/23 2133   Oral care mouth rinse  15 mL Mouth Rinse PRN Regalado, Belkys A, MD       oxyCODONE  (Oxy IR/ROXICODONE ) immediate release tablet 5 mg  5 mg Oral Q4H PRN Regalado, Belkys A, MD   5 mg at 05/09/23 2000   pantoprazole  (PROTONIX ) EC tablet 40 mg  40 mg Oral Daily Regalado, Belkys A, MD   40 mg at 05/11/23 0853   polyethylene glycol (MIRALAX  / GLYCOLAX ) packet 17 g  17 g Oral BID Jadine Toribio SQUIBB, MD       senna (SENOKOT) tablet 8.6 mg  1 tablet Oral QHS Jadine Toribio SQUIBB, MD       sertraline  (ZOLOFT ) tablet 50 mg  50 mg Oral Daily Regalado, Belkys A, MD   50 mg at 05/11/23 9146   traZODone  (DESYREL ) tablet 50 mg  50 mg Oral QHS Regalado, Belkys A, MD   50 mg at 05/10/23 2216     Discharge Medications: Please see discharge summary for a list of discharge medications.  Relevant Imaging Results:  Relevant  Lab Results:   Additional Information SS#[id number] [place], [place]

## 2023-05-11 NOTE — Progress Notes (Signed)
 Mobility Specialist - Progress Note   05/11/23 1034  Mobility  Activity Transferred to/from [facility]  Level of Assistance Contact guard assist, steadying assist  Assistive Device BSC  Range of Motion/Exercises Active  Activity Response Tolerated well  Mobility Referral Yes  Mobility visit 1 Mobility  Mobility Specialist Start Time (ACUTE ONLY) 1015  Mobility Specialist Stop Time (ACUTE ONLY) 1034  Mobility Specialist Time Calculation (min) (ACUTE ONLY) 19 min   Pt was found on recliner chair wanting assistance to [facility]. Assisted pt with pericare needs. At EOS returned to recliner chair with all needs met. Call bell in reach.  Erminio Leos Mobility Specialist

## 2023-05-12 DIAGNOSIS — E871 Hypo-osmolality and hyponatremia: Secondary | ICD-10-CM

## 2023-05-12 DIAGNOSIS — D649 Anemia, unspecified: Secondary | ICD-10-CM | POA: Insufficient documentation

## 2023-05-12 DIAGNOSIS — I480 Paroxysmal atrial fibrillation: Secondary | ICD-10-CM | POA: Diagnosis not present

## 2023-05-12 DIAGNOSIS — R7989 Other specified abnormal findings of blood chemistry: Secondary | ICD-10-CM | POA: Insufficient documentation

## 2023-05-12 DIAGNOSIS — N179 Acute kidney failure, unspecified: Secondary | ICD-10-CM | POA: Diagnosis not present

## 2023-05-12 DIAGNOSIS — I5031 Acute diastolic (congestive) heart failure: Secondary | ICD-10-CM | POA: Diagnosis not present

## 2023-05-12 DIAGNOSIS — N183 Chronic kidney disease, stage 3 unspecified: Secondary | ICD-10-CM | POA: Insufficient documentation

## 2023-05-12 LAB — GLUCOSE, CAPILLARY: Glucose-Capillary: 76 mg/dL (ref 70–99)

## 2023-05-12 MED ORDER — CYANOCOBALAMIN 1000 MCG PO TABS: 1000.0000 ug | ORAL_TABLET | Freq: Every day | ORAL | Status: DC

## 2023-05-12 MED ORDER — POLYETHYLENE GLYCOL 3350 17 G PO PACK: 17.0000 g | PACK | Freq: Every day | ORAL | Status: DC | PRN

## 2023-05-12 NOTE — Plan of Care (Signed)
  Problem: Clinical Measurements: Goal: Ability to maintain clinical measurements within normal limits will improve Outcome: Progressing Goal: Will remain free from infection Outcome: Progressing Goal: Diagnostic test results will improve Outcome: Progressing Goal: Respiratory complications will improve Outcome: Progressing Goal: Cardiovascular complication will be avoided Outcome: Progressing   Problem: Health Behavior/Discharge Planning: Goal: Ability to manage health-related needs will improve Outcome: Progressing   Problem: Activity: Goal: Risk for activity intolerance will decrease Outcome: Progressing   Problem: Nutrition: Goal: Adequate nutrition will be maintained Outcome: Progressing   Problem: Elimination: Goal: Will not experience complications related to bowel motility Outcome: Progressing Goal: Will not experience complications related to urinary retention Outcome: Progressing

## 2023-05-12 NOTE — Care Management (Addendum)
 [facility] has been called for transport to [place] [facility]. Notified RN.  Dc summary, facesheet faxed to [facility] [phone]. Per dayshift RNCM signed DNR is in Insurance Account Manager at geographical information systems officer desk. Dayshift RNCM has spoken with patient's daughter to advise of dc to [facility]   No additional TOC needs at this time.

## 2023-05-12 NOTE — Discharge Summary (Signed)
 Physician Discharge Summary   Patient: Kimberly [name] MRN: [id number] DOB: Dec 18, 1926  Admit date:     05/03/2023  Discharge date: 05/12/23  Discharge Physician: Toribio Door   PCP: Renato Dorothey HERO, NP   Recommendations at discharge:   Decompensated HFpEF    Elevated TSH TSH mildly elevated free T4 within normal limits Free T3 slightly low. Recommend repeat testing in the outpatient setting  Goals for care DNR. Transition back to ALF. Would recommend outpatient palliative to follow. Low threshold to consider hospice services with further decline.   Discharge Diagnoses: Principal Problem:   Acute heart failure with preserved ejection fraction (HFpEF) (HCC) Active Problems:   Hyponatremia   Paroxysmal atrial fibrillation (HCC)   AKI (acute kidney injury) (HCC)   Chronic kidney disease (CKD), stage III (moderate) (HCC)   Elevated TSH   Normocytic anemia  Resolved Problems:   * No resolved hospital problems. *  Hospital Course: 89 year old woman with HFpEF EF 50%, A-fib, HTN, bipolar illness was admitted with decompensated heart failure and bilateral lower extremity weeping. Seen by cardiology, and successfully diuresed. Mild AKI improved.  Seen by palliative with recommendation for outpatient palliative.  Consultants Cardiology Gastroenterology Palliative medicine  Procedures/Events None   Acute / Chronic HFpEF  Hyponatremia -- chronic, presumably secondary to above Relative hypotension Echocardiogram LVEF 50%, cannot assess diastolic function.   Appears stable now and compensated.  Not currently on any diuretics.  Monitor volume status, weigh daily.  Utilize diuretic as needed.   Constipation  Acute/chronic lower abd pain Continue bowel regimen.   Bronchitis Chest x-ray without any consolidation, trace vascular edema he completed doxycycline . Resolved.  AKI CKD stage IIIb Creatinine baseline around 1.2, peaked at 1.78. Very close to  baseline  Elevated TSH TSH mildly elevated free T4 within normal limits Free T3 slightly low. Recommend repeat testing in the outpatient setting  Chronic normocytic anemia Fecal occult blood positive.  CT abdomen pelvis unrevealing. Seen by gastroenterology, given lack of findings on imaging and no active blood loss patient was felt to be unstable for endoscopic procedure secondary to labile blood pressures.  Could consider capsule endoscopy in the future.  Okay to resume anticoagulant per GI. Stable.  Can follow-up as an outpatient as clinically indicated.    Hypothermia -- resolved  Cellulitis bilateral lower extremities Treated with doxycycline .   Permanent atrial fibrillation High-grade AV block s/p PPM Continue Eliquis    Bipolar disorder Insomnia and depression Continue Remeron , Zoloft  and trazodone   Goals for care DNR. Transition back to ALF, would recommend outpatient palliative to follow. Low threshold to consider hospice services with further decline.   Disposition:  Return to ALF Diet recommendation:  Diet Orders (From admission, onward)     Start     Ordered   05/12/23 0000  Diet - low sodium heart healthy        05/12/23 1559   05/03/23 2203  Diet regular Room service appropriate? Yes; Fluid consistency: Thin  Diet effective now       Question Answer Comment  Room service appropriate? Yes   Fluid consistency: Thin      05/03/23 2202            DISCHARGE MEDICATION: Allergies as of 05/12/2023       Reactions   Penicillins Hives   DID THE REACTION INVOLVE: Swelling of the face/tongue/throat, SOB, or low BP? Unknown Sudden or severe rash/hives, skin peeling, or the inside of the mouth or nose? Yes Did it require medical  treatment? Yes When did it last happen?    54 or 89 years old   If all above answers are "NO", may proceed with cephalosporin use. DID THE REACTION INVOLVE: Swelling of the face/tongue/throat, SOB, or low BP? Unknown  Sudden or severe  rash/hives, skin peeling, or the inside of the mouth or nose? Yes  Did it require medical treatment? Yes  When did it last happen?    26 or 89 years old    If all above answers are NO, may proceed with cephalosporin use.   Ceftriaxone  Itching   Camphor Other (See Comments)   Allergic, per facility   Ciprofloxacin  Diarrhea, Other (See Comments)   Possibly caused diarrhea November 2018   Flagyl  [metronidazole ] Other (See Comments)   Possibly caused diarrhea November 2018   Papaya Derivatives Hives   Iodine Rash, Other (See Comments)   If injected,  Rash/irritated skin reaction welts   Sulfa Antibiotics Rash        Medication List     STOP taking these medications    meloxicam 7.5 MG tablet Commonly known as: MOBIC   tiZANidine  4 MG tablet Commonly known as: ZANAFLEX    traMADol  50 MG tablet Commonly known as: ULTRAM    zolpidem  5 MG tablet Commonly known as: AMBIEN        TAKE these medications    apixaban  2.5 MG Tabs tablet Commonly known as: ELIQUIS  Take 1 tablet (2.5 mg total) by mouth 2 (two) times daily. What changed: Another medication with the same name was removed. Continue taking this medication, and follow the directions you see here.   Blink Tears 0.25 % Soln Generic drug: Polyethylene Glycol 400 Place 1 drop into both eyes 3 (three) times daily.   calcium  carbonate 500 MG chewable tablet Commonly known as: TUMS - dosed in mg elemental calcium  Chew 1 tablet by mouth every 8 (eight) hours as needed for indigestion or heartburn.   carvedilol  6.25 MG tablet Commonly known as: COREG  TAKE 1 TABLET BY MOUTH TWICE  DAILY WITH MEALS   cyanocobalamin  1000 MCG tablet Take 1 tablet (1,000 mcg total) by mouth daily. Start taking on: May 13, 2023   dicyclomine  20 MG tablet Commonly known as: BENTYL  Take 20 mg by mouth every 8 (eight) hours as needed (for gas). What changed: Another medication with the same name was removed. Continue taking this  medication, and follow the directions you see here.   furosemide  20 MG tablet Commonly known as: LASIX  Take 1 tablet (20 mg total) by mouth daily as needed for edema (for worsening leg swelling or shortness of breath). What changed: when to take this   mirtazapine  15 MG tablet Commonly known as: REMERON  Take 15 mg by mouth at bedtime.   omeprazole  20 MG capsule Commonly known as: PRILOSEC  Take 1 capsule (20 mg total) by mouth daily. What changed: when to take this   polyethylene glycol 17 g packet Commonly known as: MIRALAX  / GLYCOLAX  Take 17 g by mouth daily as needed.   potassium chloride  10 MEQ tablet Commonly known as: KLOR-CON  M Take 1 tablet (10 mEq total) by mouth daily as needed (take with furosemide ).   PreserVision AREDS 2 Caps Take 1 capsule by mouth every 12 (twelve) hours.   Ocuvite Adult Formula Caps Take 1 capsule by mouth in the morning.   sertraline  50 MG tablet Commonly known as: ZOLOFT  Take 50 mg by mouth in the morning.   Systane 0.4-0.3 % Gel ophthalmic gel Generic drug: Polyethyl Glycol-Propyl  Glycol Place 1 Application into both eyes every 8 (eight) hours as needed (for dryness or irritation).        Follow-up Information     Renato Dorothey HERO, NP. Schedule an appointment as soon as possible for a visit in 1 week(s).   Specialty: Internal Medicine Contact information: [address] [place]  [address] [place] [address] [phone]         Francyne Headland, MD Follow up.   Specialty: Cardiology Why: Office will contact you with an appointment Contact information: [address] [place] [address] [phone]                Feels better  Discharge Exam: Filed Weights   05/10/23 0500 05/11/23 0500 05/12/23 0500  Weight: 60.6 kg 60.4 kg 59.6 kg   Physical Exam Vitals reviewed.  Constitutional:      General: She is not in acute distress.    Appearance: She is not ill-appearing or toxic-appearing.  Cardiovascular:      Rate and Rhythm: Normal rate and regular rhythm.     Heart sounds: No murmur heard. Pulmonary:     Effort: Pulmonary effort is normal. No respiratory distress.     Breath sounds: No wheezing, rhonchi or rales.  Musculoskeletal:     Right lower leg: No edema.     Left lower leg: No edema.  Neurological:     Mental Status: She is alert.  Psychiatric:        Mood and Affect: Mood normal.        Behavior: Behavior normal.      Condition at discharge: good  The results of significant diagnostics from this hospitalization (including imaging, microbiology, ancillary and laboratory) are listed below for reference.   Imaging Studies: DG CHEST PORT 1 VIEW Result Date: 05/09/2023 CLINICAL DATA:  CHF EXAM: PORTABLE CHEST 1 VIEW COMPARISON:  X-ray 05/03/2023. FINDINGS: Enlarged cardiopericardial silhouette with calcified aorta. Vascular congestion and some trace edema. Bilateral pleural effusions also seen. Left upper chest defibrillator. Calcified aorta. Fixation hardware along the lower cervical spine. Bilateral apical pleural thickening as well as some pleural calcification. Film is under penetrated. IMPRESSION: Enlarged heart with defibrillator. Vascular congestion with trace edema. Increasing small pleural effusions. Recommend follow-up. Electronically Signed   By: Ranell Bring M.D.   On: 05/09/2023 14:56   CT ABDOMEN PELVIS WO CONTRAST Result Date: 05/05/2023 CLINICAL DATA:  Anemia. Fall a possible splenic flexure diverticulitis. EXAM: CT ABDOMEN AND PELVIS WITHOUT CONTRAST TECHNIQUE: Multidetector CT imaging of the abdomen and pelvis was performed following the standard protocol without IV contrast. RADIATION DOSE REDUCTION: This exam was performed according to the departmental dose-optimization program which includes automated exposure control, adjustment of the mA and/or kV according to patient size and/or use of iterative reconstruction technique. COMPARISON:  CT 12/23/2022 FINDINGS:  Lower chest: Small bilateral pleural effusions, right greater than left. Increased atelectasis in the dependent lower lobes. The heart is enlarged. Coronary artery calcifications. Hepatobiliary: Stable cysts in the liver. No new liver abnormality. Cholecystectomy. Stable biliary tree. Pancreas: Atrophic.  No ductal dilatation or inflammation. Spleen: Calcified granuloma.  No splenomegaly. Adrenals/Urinary Tract: No adrenal nodules. No hydronephrosis. Punctate stone in the lower pole of the right kidney. No ureteral stone. Physiologically distended urinary bladder with multiple diverticula. Stomach/Bowel: Colonic diverticulosis, extensive involving the sigmoid. No diverticulitis or acute colonic inflammation. Particularly, no evidence of splenic diverticulitis. No small bowel obstruction. There is a moderate hiatal hernia. Appendix not confidently seen. Vascular/Lymphatic: Advanced aortic  and branch atherosclerosis. No portal venous or mesenteric gas. No adenopathy. Reproductive: Status post hysterectomy. No adnexal masses. Other: No free air or ascites. Laxity of the anterior abdominal wall. Small supraumbilical ventral abdominal wall hernia contains only fat. Musculoskeletal: Severe scoliosis and degenerative change in the spine. Chronic T12 superior endplate compression deformity. IMPRESSION: 1. Colonic diverticulosis without diverticulitis. Specifically, no diverticulitis of the splenic flexure. 2. Moderate hiatal hernia. 3. Small bilateral pleural effusions, right greater than left. Increased atelectasis in the dependent lower lobes since August. 4. Cardiomegaly. 5. Punctate nonobstructing right renal stone. Aortic Atherosclerosis (ICD10-I70.0). Electronically Signed   By: Andrea Gasman M.D.   On: 05/05/2023 02:09   ECHOCARDIOGRAM COMPLETE Result Date: 05/04/2023    ECHOCARDIOGRAM REPORT   Patient Name:   SAYWARD HORVATH Date of Exam: 05/04/2023 Medical Rec #:  [id number]     Height:       67.0 in Accession  #:    [id number]    Weight:       125.0 lb Date of Birth:  1926-07-19     BSA:          1.656 m Patient Age:    89 years      BP:           147/79 mmHg Patient Gender: F             HR:           60 bpm. Exam Location:  Inpatient Procedure: 2D Echo, Cardiac Doppler and Color Doppler Indications:    Congestive Heart Failure I50.9  History:        Patient has prior history of Echocardiogram examinations, most                 recent 11/21/2019. CHF and Cardiomyopathy; Arrythmias:Atrial                 Fibrillation.  Sonographer:    Jayson Gaskins Referring Phys: [id number] ROBERT DORRELL IMPRESSIONS  1. Left ventricular ejection fraction, by estimation, is 50 to 55%. The left ventricle has low normal function. The left ventricle has no regional wall motion abnormalities. Left ventricular diastolic parameters are indeterminate. Elevated left ventricular end-diastolic pressure.  2. Right ventricular systolic function is normal. The right ventricular size is normal.  3. Left atrial size was severely dilated.  4. Right atrial size was severely dilated.  5. The mitral valve is normal in structure. Mild mitral valve regurgitation. No evidence of mitral stenosis.  6. Tricuspid valve regurgitation is moderate.  7. The aortic valve is tricuspid. There is mild calcification of the aortic valve. There is mild thickening of the aortic valve. Aortic valve regurgitation is not visualized. Mild aortic valve stenosis. FINDINGS  Left Ventricle: Left ventricular ejection fraction, by estimation, is 50 to 55%. The left ventricle has low normal function. The left ventricle has no regional wall motion abnormalities. The left ventricular internal cavity size was normal in size. There is no left ventricular hypertrophy. Left ventricular diastolic parameters are indeterminate. Elevated left ventricular end-diastolic pressure. Right Ventricle: The right ventricular size is normal. No increase in right ventricular wall thickness. Right ventricular  systolic function is normal. Left Atrium: Left atrial size was severely dilated. Right Atrium: Right atrial size was severely dilated. Pericardium: There is no evidence of pericardial effusion. Mitral Valve: The mitral valve is normal in structure. Mild mitral valve regurgitation. No evidence of mitral valve stenosis. Tricuspid Valve: The tricuspid valve is normal in structure. Tricuspid valve regurgitation  is moderate . No evidence of tricuspid stenosis. Aortic Valve: The aortic valve is tricuspid. There is mild calcification of the aortic valve. There is mild thickening of the aortic valve. Aortic valve regurgitation is not visualized. Mild aortic stenosis is present. Aortic valve mean gradient measures  4.0 mmHg. Aortic valve peak gradient measures 7.1 mmHg. Aortic valve area, by VTI measures 1.14 cm. Pulmonic Valve: The pulmonic valve was normal in structure. Pulmonic valve regurgitation is not visualized. No evidence of pulmonic stenosis. Aorta: The aortic root is normal in size and structure. IAS/Shunts: No atrial level shunt detected by color flow Doppler. Additional Comments: A device lead is visualized.  LEFT VENTRICLE PLAX 2D LVIDd:         4.30 cm   Diastology LVIDs:         3.10 cm   LV e' medial:    5.33 cm/s LV PW:         1.00 cm   LV E/e' medial:  18.7 LV IVS:        1.00 cm   LV e' lateral:   6.53 cm/s LVOT diam:     1.70 cm   LV E/e' lateral: 15.3 LV SV:         35 LV SV Index:   21 LVOT Area:     2.27 cm  LEFT ATRIUM             Index LA Vol (A2C):   71.3 ml 43.05 ml/m LA Vol (A4C):   76.0 ml 45.89 ml/m LA Biplane Vol: 74.9 ml 45.23 ml/m  AORTIC VALVE AV Area (Vmax):    1.49 cm AV Area (Vmean):   1.20 cm AV Area (VTI):     1.14 cm AV Vmax:           133.00 cm/s AV Vmean:          90.200 cm/s AV VTI:            0.307 m AV Peak Grad:      7.1 mmHg AV Mean Grad:      4.0 mmHg LVOT Vmax:         87.50 cm/s LVOT Vmean:        47.800 cm/s LVOT VTI:          0.154 m LVOT/AV VTI ratio: 0.50  AORTA  Ao Root diam: 3.20 cm MITRAL VALVE               TRICUSPID VALVE MV Area (PHT): 2.59 cm    TR Peak grad:   32.3 mmHg MV Decel Time: 293 msec    TR Vmax:        284.00 cm/s MV E velocity: 99.80 cm/s                            SHUNTS                            Systemic VTI:  0.15 m                            Systemic Diam: 1.70 cm Annabella Scarce MD Electronically signed by Annabella Scarce MD Signature Date/Time: 05/04/2023/5:01:17 PM    Final    DG Chest Port 1 View Result Date: 05/03/2023 CLINICAL DATA:  Dizziness and shortness of breath. Weeping edema both lower extremities. EXAM: PORTABLE CHEST  1 VIEW COMPARISON:  Portable chest 01/05/2023, 01/01/2023, 12/23/2022 FINDINGS: Left chest dual lead pacemaker, wiring and additional AID wire are unaltered. The heart is enlarged. There is increased central vascular prominence and flow cephalization, interval development of mild basilar and central interstitial edema, small pleural effusions. No focal airspace disease is seen. Bilateral diaphragmatic eventrations are again noted. The aorta is tortuous and calcified with stable mediastinum. Chronic biapical pleural-parenchymal scarring and calcifications are again shown. No new osseous findings. Osteopenia and degenerative change of the spine. IMPRESSION: 1. Cardiomegaly with increased central vascular prominence and flow cephalization, interval development of mild basilar and central interstitial edema, small pleural effusions. Findings are consistent with CHF. 2. Aortic atherosclerosis with heavy calcification, uncoiling. Electronically Signed   By: Francis Quam M.D.   On: 05/03/2023 22:05    Microbiology: Results for orders placed or performed during the hospital encounter of 05/03/23  Resp panel by RT-PCR (RSV, Flu A&B, Covid) Anterior Nasal Swab     Status: None   Collection Time: 05/03/23  6:41 PM   Specimen: Anterior Nasal Swab  Result Value Ref Range Status   SARS Coronavirus 2 by RT PCR NEGATIVE  NEGATIVE Final    Comment: (NOTE) SARS-CoV-2 target nucleic acids are NOT DETECTED.  The SARS-CoV-2 RNA is generally detectable in upper respiratory specimens during the acute phase of infection. The lowest concentration of SARS-CoV-2 viral copies this assay can detect is 138 copies/mL. A negative result does not preclude SARS-Cov-2 infection and should not be used as the sole basis for treatment or other patient management decisions. A negative result may occur with  improper specimen collection/handling, submission of specimen other than nasopharyngeal swab, presence of viral mutation(s) within the areas targeted by this assay, and inadequate number of viral copies(<138 copies/mL). A negative result must be combined with clinical observations, patient history, and epidemiological information. The expected result is Negative.  Fact Sheet for Patients:  bloggercourse.com  Fact Sheet for Healthcare Providers:  seriousbroker.it  This test is no t yet approved or cleared by the [place]  FDA and  has been authorized for detection and/or diagnosis of SARS-CoV-2 by FDA under an Emergency Use Authorization (EUA). This EUA will remain  in effect (meaning this test can be used) for the duration of the COVID-19 declaration under Section 564(b)(1) of the Act, 21 U.S.C.section 360bbb-3(b)(1), unless the authorization is terminated  or revoked sooner.       Influenza A by PCR NEGATIVE NEGATIVE Final   Influenza B by PCR NEGATIVE NEGATIVE Final    Comment: (NOTE) The Xpert Xpress SARS-CoV-2/FLU/RSV plus assay is intended as an aid in the diagnosis of influenza from Nasopharyngeal swab specimens and should not be used as a sole basis for treatment. Nasal washings and aspirates are unacceptable for Xpert Xpress SARS-CoV-2/FLU/RSV testing.  Fact Sheet for Patients: bloggercourse.com  Fact Sheet for Healthcare  Providers: seriousbroker.it  This test is not yet approved or cleared by the [place]  FDA and has been authorized for detection and/or diagnosis of SARS-CoV-2 by FDA under an Emergency Use Authorization (EUA). This EUA will remain in effect (meaning this test can be used) for the duration of the COVID-19 declaration under Section 564(b)(1) of the Act, 21 U.S.C. section 360bbb-3(b)(1), unless the authorization is terminated or revoked.     Resp Syncytial Virus by PCR NEGATIVE NEGATIVE Final    Comment: (NOTE) Fact Sheet for Patients: bloggercourse.com  Fact Sheet for Healthcare Providers: seriousbroker.it  This test is not yet approved or cleared  by the [place]  FDA and has been authorized for detection and/or diagnosis of SARS-CoV-2 by FDA under an Emergency Use Authorization (EUA). This EUA will remain in effect (meaning this test can be used) for the duration of the COVID-19 declaration under Section 564(b)(1) of the Act, 21 U.S.C. section 360bbb-3(b)(1), unless the authorization is terminated or revoked.  Performed at [facility], [address], [place], [place] [address]   MRSA Next Gen by PCR, Nasal     Status: None   Collection Time: 05/04/23  4:00 PM   Specimen: Nasal Mucosa; Nasal Swab  Result Value Ref Range Status   MRSA by PCR Next Gen NOT DETECTED NOT DETECTED Final    Comment: (NOTE) The GeneXpert MRSA Assay (FDA approved for NASAL specimens only), is one component of a comprehensive MRSA colonization surveillance program. It is not intended to diagnose MRSA infection nor to guide or monitor treatment for MRSA infections. Test performance is not FDA approved in patients less than 84 years old. Performed at [facility], [address] [place], [place] [address]    *Note: Due to a large number of results and/or encounters for the  requested time period, some results have not been displayed. A complete set of results can be found in Results Review.    Labs: CBC: Recent Labs  Lab 05/06/23 0448 05/07/23 0337 05/08/23 0518 05/11/23 0458  WBC 10.7* 6.4 6.4 6.5  HGB 7.9* 8.2* 8.0* 7.7*  HCT 25.0* 26.2* 25.1* 23.5*  MCV 83.3 84.2 82.8 79.9*  PLT 170 166 175 180   Basic Metabolic Panel: Recent Labs  Lab 05/06/23 0328 05/07/23 0337 05/08/23 0518 05/09/23 0607 05/11/23 0458  NA 127* 126* 127* 126* 130*  K 4.3 5.5* 4.0 4.0 3.8  CL 90* 89* 87* 85* 90*  CO2 26 25 28  32 29  GLUCOSE 104* 76 81 84 87  BUN 52* 55* 65* 67* 73*  CREATININE 1.73* 1.78* 1.44* 1.32* 1.28*  CALCIUM  8.3* 8.4* 8.6* 8.6* 9.0  MG  --   --   --  1.9  --    Liver Function Tests: No results for input(s): AST, ALT, ALKPHOS, BILITOT, PROT, ALBUMIN  in the last 168 hours. CBG: Recent Labs  Lab 05/10/23 0734 05/10/23 2133 05/12/23 0744  GLUCAP 88 97 76    Discharge time spent: greater than 30 minutes.  Signed: Toribio Door, MD [facility] 05/12/2023

## 2023-05-12 NOTE — Plan of Care (Signed)

## 2023-05-12 NOTE — TOC Progression Note (Addendum)
 Transition of Care [facility]) - Progression Note    Patient Details  Name: Kimberly [name] MRN: [id number] Date of Birth: 1926-09-11  [facility] CM/SW Contact  Kendyl Bissonnette, Nathanel, RN Phone Number: 05/12/2023, 2:43 PM  Clinical Narrative: [facility]. DNR, PTAR @ d/c.Patient to receive [facility] home hospice rep Ahnae following.   -3:44p-going to rm#8,report #[phone], ptar forms @ [facility] station, awaiting d/c summary to send.fax#[phone]. PTAR to be called.    Expected Discharge Plan: Assisted Living Barriers to Discharge: Continued Medical Work up  Expected Discharge Plan and Services   Discharge Planning Services: CM Consult   Living arrangements for the past 2 months: Skilled Nursing Facility                                       Social Determinants of Health (SDOH) Interventions SDOH Screenings   Food Insecurity: No Food Insecurity (05/09/2023)  Housing: Low Risk  (05/09/2023)  Transportation Needs: No Transportation Needs (05/09/2023)  Utilities: Not At Risk (05/09/2023)  Alcohol  Screen: Low Risk  (10/22/2018)  Depression (PHQ2-9): Medium Risk (12/13/2018)  Financial Resource Strain: Low Risk  (01/06/2023)   Received from [facility]  Physical Activity: Inactive (08/15/2019)  Social Connections: Socially Isolated (05/06/2023)  Stress: No Stress Concern Present (10/22/2018)  Tobacco Use: Medium Risk (05/03/2023)  Health Literacy: Medium Risk (01/06/2023)   Received from [facility]    Readmission Risk Interventions    05/09/2023    3:46 PM  Readmission Risk Prevention Plan  Transportation Screening Complete  PCP or Specialist Appt within 5-7 Days Complete  Home Care Screening Complete  Medication Review (RN CM) Complete

## 2023-05-13 ENCOUNTER — Ambulatory Visit (INDEPENDENT_AMBULATORY_CARE_PROVIDER_SITE_OTHER): Payer: Medicare Other

## 2023-05-13 DIAGNOSIS — I442 Atrioventricular block, complete: Secondary | ICD-10-CM | POA: Diagnosis not present

## 2023-05-13 LAB — CUP PACEART REMOTE DEVICE CHECK
Battery Remaining Longevity: 60 mo
Battery Voltage: 2.96 V
Brady Statistic AP VP Percent: 0 %
Brady Statistic AP VS Percent: 0 %
Brady Statistic AS VP Percent: 98.94 %
Brady Statistic AS VS Percent: 1.06 %
Brady Statistic RA Percent Paced: 0 %
Brady Statistic RV Percent Paced: 98.93 %
Date Time Interrogation Session: 20250108003335
Implantable Lead Connection Status: 753985
Implantable Lead Connection Status: 753985
Implantable Lead Implant Date: 19971124
Implantable Lead Implant Date: 20070831
Implantable Lead Location: 753858
Implantable Lead Location: 753860
Implantable Lead Model: 4024
Implantable Lead Model: 4194
Implantable Pulse Generator Implant Date: 20220214
Lead Channel Impedance Value: 3363 Ohm
Lead Channel Impedance Value: 3363 Ohm
Lead Channel Impedance Value: 342 Ohm
Lead Channel Impedance Value: 361 Ohm
Lead Channel Impedance Value: 437 Ohm
Lead Channel Impedance Value: 456 Ohm
Lead Channel Impedance Value: 513 Ohm
Lead Channel Impedance Value: 608 Ohm
Lead Channel Impedance Value: 722 Ohm
Lead Channel Pacing Threshold Amplitude: 0.75 V
Lead Channel Pacing Threshold Amplitude: 1.75 V
Lead Channel Pacing Threshold Pulse Width: 0.4 ms
Lead Channel Pacing Threshold Pulse Width: 1 ms
Lead Channel Sensing Intrinsic Amplitude: 0.25 mV
Lead Channel Sensing Intrinsic Amplitude: 0.25 mV
Lead Channel Sensing Intrinsic Amplitude: 8.375 mV
Lead Channel Sensing Intrinsic Amplitude: 8.375 mV
Lead Channel Setting Pacing Amplitude: 2.5 V
Lead Channel Setting Pacing Amplitude: 2.75 V
Lead Channel Setting Pacing Pulse Width: 0.4 ms
Lead Channel Setting Pacing Pulse Width: 1 ms
Lead Channel Setting Sensing Sensitivity: 1.2 mV
Zone Setting Status: 755011

## 2023-05-17 ENCOUNTER — Other Ambulatory Visit: Payer: Self-pay

## 2023-05-17 ENCOUNTER — Observation Stay (HOSPITAL_COMMUNITY)
Admission: EM | Admit: 2023-05-17 | Discharge: 2023-05-18 | Disposition: A | Discharge status: Skilled Nursing Facility | Attending: Internal Medicine | Admitting: Internal Medicine

## 2023-05-17 ENCOUNTER — Observation Stay (HOSPITAL_COMMUNITY)

## 2023-05-17 ENCOUNTER — Emergency Department (HOSPITAL_COMMUNITY)

## 2023-05-17 ENCOUNTER — Encounter (HOSPITAL_COMMUNITY): Payer: Self-pay

## 2023-05-17 DIAGNOSIS — Z86718 Personal history of other venous thrombosis and embolism: Secondary | ICD-10-CM | POA: Diagnosis not present

## 2023-05-17 DIAGNOSIS — I4821 Permanent atrial fibrillation: Secondary | ICD-10-CM | POA: Insufficient documentation

## 2023-05-17 DIAGNOSIS — N183 Chronic kidney disease, stage 3 unspecified: Secondary | ICD-10-CM | POA: Diagnosis not present

## 2023-05-17 DIAGNOSIS — D649 Anemia, unspecified: Secondary | ICD-10-CM | POA: Diagnosis present

## 2023-05-17 DIAGNOSIS — Z7901 Long term (current) use of anticoagulants: Secondary | ICD-10-CM | POA: Insufficient documentation

## 2023-05-17 DIAGNOSIS — E871 Hypo-osmolality and hyponatremia: Secondary | ICD-10-CM | POA: Insufficient documentation

## 2023-05-17 DIAGNOSIS — Z8659 Personal history of other mental and behavioral disorders: Secondary | ICD-10-CM

## 2023-05-17 DIAGNOSIS — Z950 Presence of cardiac pacemaker: Secondary | ICD-10-CM | POA: Diagnosis not present

## 2023-05-17 DIAGNOSIS — X31XXXA Exposure to excessive natural cold, initial encounter: Secondary | ICD-10-CM | POA: Diagnosis not present

## 2023-05-17 DIAGNOSIS — I5043 Acute on chronic combined systolic (congestive) and diastolic (congestive) heart failure: Secondary | ICD-10-CM | POA: Insufficient documentation

## 2023-05-17 DIAGNOSIS — I5031 Acute diastolic (congestive) heart failure: Secondary | ICD-10-CM | POA: Diagnosis present

## 2023-05-17 DIAGNOSIS — D530 Protein deficiency anemia: Secondary | ICD-10-CM | POA: Insufficient documentation

## 2023-05-17 DIAGNOSIS — G9340 Encephalopathy, unspecified: Secondary | ICD-10-CM | POA: Diagnosis not present

## 2023-05-17 DIAGNOSIS — I428 Other cardiomyopathies: Secondary | ICD-10-CM

## 2023-05-17 DIAGNOSIS — T68XXXA Hypothermia, initial encounter: Secondary | ICD-10-CM | POA: Diagnosis not present

## 2023-05-17 DIAGNOSIS — Z87891 Personal history of nicotine dependence: Secondary | ICD-10-CM | POA: Diagnosis not present

## 2023-05-17 DIAGNOSIS — F319 Bipolar disorder, unspecified: Secondary | ICD-10-CM | POA: Diagnosis present

## 2023-05-17 DIAGNOSIS — I509 Heart failure, unspecified: Secondary | ICD-10-CM

## 2023-05-17 DIAGNOSIS — R1310 Dysphagia, unspecified: Secondary | ICD-10-CM | POA: Insufficient documentation

## 2023-05-17 DIAGNOSIS — Z1152 Encounter for screening for COVID-19: Secondary | ICD-10-CM | POA: Insufficient documentation

## 2023-05-17 DIAGNOSIS — I442 Atrioventricular block, complete: Secondary | ICD-10-CM | POA: Diagnosis present

## 2023-05-17 DIAGNOSIS — I130 Hypertensive heart and chronic kidney disease with heart failure and stage 1 through stage 4 chronic kidney disease, or unspecified chronic kidney disease: Secondary | ICD-10-CM | POA: Insufficient documentation

## 2023-05-17 DIAGNOSIS — I480 Paroxysmal atrial fibrillation: Secondary | ICD-10-CM | POA: Diagnosis present

## 2023-05-17 DIAGNOSIS — F314 Bipolar disorder, current episode depressed, severe, without psychotic features: Secondary | ICD-10-CM | POA: Insufficient documentation

## 2023-05-17 DIAGNOSIS — R464 Slowness and poor responsiveness: Secondary | ICD-10-CM | POA: Diagnosis present

## 2023-05-17 LAB — CBC WITH DIFFERENTIAL/PLATELET
Abs Immature Granulocytes: 0.06 10*3/uL (ref 0.00–0.07)
Basophils Absolute: 0 10*3/uL (ref 0.0–0.1)
Basophils Relative: 0 %
Eosinophils Absolute: 0.1 10*3/uL (ref 0.0–0.5)
Eosinophils Relative: 2 %
HCT: 25.1 % — ABNORMAL LOW (ref 36.0–46.0)
Hemoglobin: 8 g/dL — ABNORMAL LOW (ref 12.0–15.0)
Immature Granulocytes: 2 %
Lymphocytes Relative: 19 %
Lymphs Abs: 0.8 10*3/uL (ref 0.7–4.0)
MCH: 25.6 pg — ABNORMAL LOW (ref 26.0–34.0)
MCHC: 31.9 g/dL (ref 30.0–36.0)
MCV: 80.2 fL (ref 80.0–100.0)
Monocytes Absolute: 0.3 10*3/uL (ref 0.1–1.0)
Monocytes Relative: 8 %
Neutro Abs: 2.8 10*3/uL (ref 1.7–7.7)
Neutrophils Relative %: 69 %
Platelets: 172 10*3/uL (ref 150–400)
RBC: 3.13 MIL/uL — ABNORMAL LOW (ref 3.87–5.11)
RDW: 18.7 % — ABNORMAL HIGH (ref 11.5–15.5)
WBC: 4 10*3/uL (ref 4.0–10.5)
nRBC: 0 % (ref 0.0–0.2)

## 2023-05-17 LAB — COMPREHENSIVE METABOLIC PANEL
ALT: 19 U/L (ref 0–44)
AST: 33 U/L (ref 15–41)
Albumin: 3.4 g/dL — ABNORMAL LOW (ref 3.5–5.0)
Alkaline Phosphatase: 68 U/L (ref 38–126)
Anion gap: 10 (ref 5–15)
BUN: 38 mg/dL — ABNORMAL HIGH (ref 8–23)
CO2: 25 mmol/L (ref 22–32)
Calcium: 8.7 mg/dL — ABNORMAL LOW (ref 8.9–10.3)
Chloride: 92 mmol/L — ABNORMAL LOW (ref 98–111)
Creatinine, Ser: 1.09 mg/dL — ABNORMAL HIGH (ref 0.44–1.00)
GFR, Estimated: 47 mL/min — ABNORMAL LOW (ref >60)
Glucose, Bld: 103 mg/dL — ABNORMAL HIGH (ref 70–99)
Potassium: 4.6 mmol/L (ref 3.5–5.1)
Sodium: 127 mmol/L — ABNORMAL LOW (ref 135–145)
Total Bilirubin: 0.7 mg/dL (ref 0.0–1.2)
Total Protein: 6.8 g/dL (ref 6.5–8.1)

## 2023-05-17 LAB — URINALYSIS, ROUTINE W REFLEX MICROSCOPIC
Bilirubin Urine: NEGATIVE
Glucose, UA: NEGATIVE mg/dL
Hgb urine dipstick: NEGATIVE
Ketones, ur: NEGATIVE mg/dL
Leukocytes,Ua: NEGATIVE
Nitrite: NEGATIVE
Protein, ur: NEGATIVE mg/dL
Specific Gravity, Urine: 1.005 (ref 1.005–1.030)
pH: 7 (ref 5.0–8.0)

## 2023-05-17 LAB — TROPONIN I (HIGH SENSITIVITY)
Troponin I (High Sensitivity): 14 ng/L (ref <18)
Troponin I (High Sensitivity): 12 ng/L (ref <18)

## 2023-05-17 LAB — VITAMIN B12: Vitamin B-12: 1351 pg/mL — ABNORMAL HIGH (ref 180–914)

## 2023-05-17 LAB — SARS CORONAVIRUS 2 BY RT PCR: SARS Coronavirus 2 by RT PCR: NEGATIVE

## 2023-05-17 LAB — BRAIN NATRIURETIC PEPTIDE: B Natriuretic Peptide: 639.1 pg/mL — ABNORMAL HIGH (ref 0.0–100.0)

## 2023-05-17 LAB — TSH: TSH: 6.268 u[IU]/mL — ABNORMAL HIGH (ref 0.350–4.500)

## 2023-05-17 LAB — PROCALCITONIN: Procalcitonin: <0.1 ng/mL

## 2023-05-17 MED ORDER — MIRTAZAPINE 15 MG PO TABS
15.0000 mg | ORAL_TABLET | Freq: Every day | ORAL | Status: DC
  Administered 2023-05-17: 15 mg via ORAL
  Filled 2023-05-17: qty 1

## 2023-05-17 MED ORDER — FUROSEMIDE 10 MG/ML IJ SOLN
20.0000 mg | Freq: Once | INTRAMUSCULAR | Status: AC
  Administered 2023-05-17: 20 mg via INTRAVENOUS
  Filled 2023-05-17: qty 2

## 2023-05-17 MED ORDER — QUETIAPINE FUMARATE 25 MG PO TABS
25.0000 mg | ORAL_TABLET | Freq: Two times a day (BID) | ORAL | Status: DC
  Administered 2023-05-17 – 2023-05-18 (×2): 25 mg via ORAL
  Filled 2023-05-17 (×2): qty 1

## 2023-05-17 MED ORDER — SERTRALINE HCL 50 MG PO TABS
50.0000 mg | ORAL_TABLET | Freq: Every morning | ORAL | Status: DC
  Administered 2023-05-18: 50 mg via ORAL
  Filled 2023-05-17: qty 1

## 2023-05-17 MED ORDER — MELATONIN 3 MG PO TABS
3.0000 mg | ORAL_TABLET | Freq: Every day | ORAL | Status: DC
  Administered 2023-05-17: 3 mg via ORAL
  Filled 2023-05-17: qty 1

## 2023-05-17 MED ORDER — VITAMIN B-12 1000 MCG PO TABS
500.0000 ug | ORAL_TABLET | Freq: Every day | ORAL | Status: DC
  Administered 2023-05-17 – 2023-05-18 (×2): 500 ug via ORAL
  Filled 2023-05-17: qty 5
  Filled 2023-05-17: qty 1

## 2023-05-17 MED ORDER — ONDANSETRON HCL 4 MG/2ML IJ SOLN: 4.0000 mg | Freq: Four times a day (QID) | INTRAMUSCULAR | Status: DC | PRN

## 2023-05-17 MED ORDER — ACETAMINOPHEN 325 MG PO TABS: 650.0000 mg | ORAL_TABLET | Freq: Four times a day (QID) | ORAL | Status: DC | PRN

## 2023-05-17 MED ORDER — FUROSEMIDE 10 MG/ML IJ SOLN
40.0000 mg | Freq: Every day | INTRAMUSCULAR | Status: DC
  Administered 2023-05-18: 40 mg via INTRAVENOUS
  Filled 2023-05-17: qty 4

## 2023-05-17 MED ORDER — PROSIGHT PO TABS
1.0000 | ORAL_TABLET | Freq: Every day | ORAL | Status: DC
  Administered 2023-05-18: 1 via ORAL
  Filled 2023-05-17 (×2): qty 1

## 2023-05-17 MED ORDER — PANTOPRAZOLE SODIUM 40 MG PO TBEC
40.0000 mg | DELAYED_RELEASE_TABLET | Freq: Every day | ORAL | Status: DC
  Administered 2023-05-18: 40 mg via ORAL
  Filled 2023-05-17: qty 1

## 2023-05-17 MED ORDER — ACETAMINOPHEN 650 MG RE SUPP: 650.0000 mg | Freq: Four times a day (QID) | RECTAL | Status: DC | PRN

## 2023-05-17 MED ORDER — PRESERVISION AREDS 2 PO CAPS: 1.0000 | ORAL_CAPSULE | Freq: Two times a day (BID) | ORAL | Status: DC

## 2023-05-17 MED ORDER — CARVEDILOL 6.25 MG PO TABS
6.2500 mg | ORAL_TABLET | Freq: Two times a day (BID) | ORAL | Status: DC
  Administered 2023-05-18 (×2): 6.25 mg via ORAL
  Filled 2023-05-17: qty 1
  Filled 2023-05-17: qty 2

## 2023-05-17 MED ORDER — ONDANSETRON HCL 4 MG PO TABS: 4.0000 mg | ORAL_TABLET | Freq: Four times a day (QID) | ORAL | Status: DC | PRN

## 2023-05-17 MED ORDER — APIXABAN 2.5 MG PO TABS
2.5000 mg | ORAL_TABLET | Freq: Two times a day (BID) | ORAL | Status: DC
  Administered 2023-05-17 – 2023-05-18 (×2): 2.5 mg via ORAL
  Filled 2023-05-17 (×2): qty 1

## 2023-05-17 NOTE — Assessment & Plan Note (Signed)
 89 year old presenting to ED after being found at her ALF unresponsive in her WC with low oxygen  to the 80s that could have been an error as her oxygen  was fine with EMS with no intervention  -obs to progressive -infectious etiology seems low on differential as urine is clean and CXR shows no consolidation. Has mild erythema on LLL from edema and was given course of doxycycline  during last hospitalization. BC pending, will check PCT -most likely secondary to hypothermia, see #2. Continue bear hugger although she is refusing this  -also daughter reports her to be manic and will get confused with this  -check  head CT for CVA,neuro exam wnl  -troponin wnl  -metabolic labs pending, B12 was low at 288 on last hospitalization will start oral replacement  -fall precautions

## 2023-05-17 NOTE — Assessment & Plan Note (Signed)
 Around baseline, continue to monitor

## 2023-05-17 NOTE — Assessment & Plan Note (Signed)
 S/p AV node ablation and PPM placement V-paced Continue coreg, eliquis

## 2023-05-17 NOTE — Assessment & Plan Note (Addendum)
 Continue Remeron, Zoloft and seroquel  Daughter states she has been manic lately

## 2023-05-17 NOTE — ED Provider Notes (Signed)
 [place] EMERGENCY DEPARTMENT AT [facility] Provider Note   CSN: [id number] Arrival date & time: 05/17/23  1003     History  Chief Complaint  Patient presents with   Shortness of Breath    Kimberly [name] is a 89 y.o. female.  Patient was sent to the emergency department from the facility where she lives.  Patient is a resident at [facility].  They report patient had a low oxygen  saturation in the 80s and that patient had rales in all of her lungs they were concerned that she was in congestive heart failure.  Patient has a past medical history of congestive heart failure.  She was discharged from the hospital here on 1 7 after being admitted for heart failure.  EMS reports improved oxygen  saturations on the way to the hospital.  [facility] reports patient was sitting in a wheelchair and became short of breath and altered.  The history is provided by the patient and the nursing home. No language interpreter was used.  Shortness of Breath      Home Medications Prior to Admission medications   Medication Sig Start Date End Date Taking? Authorizing Provider  apixaban  (ELIQUIS ) 2.5 MG TABS tablet Take 1 tablet (2.5 mg total) by mouth 2 (two) times daily. 09/01/22   Croitoru, Mihai, MD  BLINK TEARS 0.25 % SOLN Place 1 drop into both eyes 3 (three) times daily.    [provider]  calcium  carbonate (TUMS - DOSED IN MG ELEMENTAL CALCIUM ) 500 MG chewable tablet Chew 1 tablet by mouth every 8 (eight) hours as needed for indigestion or heartburn.    [provider]  carvedilol  (COREG ) 6.25 MG tablet TAKE 1 TABLET BY MOUTH TWICE  DAILY WITH MEALS 12/19/22   Croitoru, Mihai, MD  cyanocobalamin  1000 MCG tablet Take 1 tablet (1,000 mcg total) by mouth daily. 05/13/23   Jadine Toribio SQUIBB, MD  dicyclomine  (BENTYL ) 20 MG tablet Take 20 mg by mouth every 8 (eight) hours as needed (for gas).    [provider]  furosemide  (LASIX ) 20 MG tablet Take 1 tablet (20 mg total) by  mouth daily as needed for edema (for worsening leg swelling or shortness of breath). Patient taking differently: Take 20 mg by mouth 2 (two) times daily. 10/24/21   Arrien, Mauricio Daniel, MD  mirtazapine  (REMERON ) 15 MG tablet Take 15 mg by mouth at bedtime.    [provider]  Multiple Vitamins-Minerals (OCUVITE ADULT FORMULA) CAPS Take 1 capsule by mouth in the morning.    [provider]  Multiple Vitamins-Minerals (PRESERVISION AREDS 2) CAPS Take 1 capsule by mouth every 12 (twelve) hours.    [provider]  omeprazole  (PRILOSEC ) 20 MG capsule Take 1 capsule (20 mg total) by mouth daily. Patient taking differently: Take 20 mg by mouth daily before breakfast. 12/30/22   Croitoru, Mihai, MD  polyethylene glycol (MIRALAX  / GLYCOLAX ) 17 g packet Take 17 g by mouth daily as needed. 05/12/23   Jadine Toribio SQUIBB, MD  potassium chloride  (KLOR-CON  M) 10 MEQ tablet Take 1 tablet (10 mEq total) by mouth daily as needed (take with furosemide ). Patient not taking: Reported on 05/03/2023 10/24/21   Arrien, Elidia Toribio, MD  sertraline  (ZOLOFT ) 50 MG tablet Take 50 mg by mouth in the morning.    [provider]  SYSTANE 0.4-0.3 % GEL ophthalmic gel Place 1 Application into both eyes every 8 (eight) hours as needed (for dryness or irritation).    [provider]  Allergies    Penicillins, Ceftriaxone , Camphor, Ciprofloxacin , Flagyl  [metronidazole ], Papaya derivatives, Iodine, and Sulfa antibiotics    Review of Systems   Review of Systems  Respiratory:  Positive for shortness of breath.   All other systems reviewed and are negative.   Physical Exam Updated Vital Signs BP 125/65   Pulse 62   Temp (!) 93.3 F (34.1 C) (Rectal)   Resp 15   SpO2 100%  Physical Exam Vitals and nursing note reviewed.  Constitutional:      Appearance: She is well-developed.  HENT:     Head: Normocephalic.  Eyes:     Extraocular Movements: Extraocular movements  intact.     Pupils: Pupils are equal, round, and reactive to light.  Cardiovascular:     Rate and Rhythm: Normal rate and regular rhythm.  Pulmonary:     Effort: Pulmonary effort is normal.     Breath sounds: No rhonchi or rales.  Abdominal:     General: There is no distension.     Palpations: Abdomen is soft.  Musculoskeletal:        General: Normal range of motion.     Cervical back: Normal range of motion.  Skin:    General: Skin is warm.  Neurological:     General: No focal deficit present.     Mental Status: She is alert.     Comments: Patient awake and alert  Psychiatric:        Mood and Affect: Mood normal.     ED Results / Procedures / Treatments   Labs (all labs ordered are listed, but only abnormal results are displayed) Labs Reviewed  CBC WITH DIFFERENTIAL/PLATELET - Abnormal; Notable for the following components:      Result Value   RBC 3.13 (*)    Hemoglobin 8.0 (*)    HCT 25.1 (*)    MCH 25.6 (*)    RDW 18.7 (*)    All other components within normal limits  COMPREHENSIVE METABOLIC PANEL - Abnormal; Notable for the following components:   Sodium 127 (*)    Chloride 92 (*)    Glucose, Bld 103 (*)    BUN 38 (*)    Creatinine, Ser 1.09 (*)    Calcium  8.7 (*)    Albumin  3.4 (*)    GFR, Estimated 47 (*)    All other components within normal limits  BRAIN NATRIURETIC PEPTIDE - Abnormal; Notable for the following components:   B Natriuretic Peptide 639.1 (*)    All other components within normal limits  URINALYSIS, ROUTINE W REFLEX MICROSCOPIC  TROPONIN I (HIGH SENSITIVITY)  TROPONIN I (HIGH SENSITIVITY)    EKG EKG Interpretation Date/Time:  Sunday May 17 2023 10:13:47 EST Ventricular Rate:  61 PR Interval:  169 QRS Duration:  151 QT Interval:  511 QTC Calculation: 515 R Axis:   -19  Text Interpretation: Ventricular-paced rhythm No significant change since last tracing Confirmed by Ellouise Fine ([id number]) on 05/17/2023 10:30:21  AM  Radiology DG Chest Port 1 View Result Date: 05/17/2023 CLINICAL DATA:  Hypoxia. EXAM: PORTABLE CHEST 1 VIEW COMPARISON:  One-view chest x-ray 05/09/2023 FINDINGS: The heart is enlarged. Atherosclerotic changes are present at the aortic arch. Mild pulmonary vascular congestion is improved. Bilateral pleural effusions are present, left greater than right. Associated airspace disease at bases likely reflects atelectasis. Pacing and defibrillator wires are stable. Cervical spine fusion is noted. IMPRESSION: 1. Cardiomegaly and mild pulmonary vascular congestion. 2. Bilateral pleural effusions, left greater than right. 3. Associated  airspace disease at the bases likely reflects atelectasis. Electronically Signed   By: Lonni Necessary M.D.   On: 05/17/2023 10:52    Procedures Procedures    Medications Ordered in ED Medications - No data to display  ED Course/ Medical Decision Making/ A&P                                 Medical Decision Making Sent to the emergency department from [facility] for a low oxygen  saturation and concern for congestive heart failure  Amount and/or Complexity of Data Reviewed Independent Historian: EMS    Details: EMS reports patient's pulse ox proved on their arrival External Data Reviewed: notes.    Details: Hospitalist notes reviewed Labs: ordered. Decision-making details documented in ED Course.    Details: Labs ordered reviewed and interpreted.  Patient's hemoglobin is 8.0 sodium is 127 BUN is 38 creatinine is 1.09 BNP is 639. Radiology: ordered and independent interpretation performed. Decision-making details documented in ED Course.    Details: Chest x-ray shows bilateral pleural effusions cardiomegaly and mild pulmonary vascular congestion ECG/medicine tests: ordered and independent interpretation performed. Decision-making details documented in ED Course.    Details: EG shows a ventricular paced rhythm  Risk Prescription drug management. Risk  Details: Patient's rectal temperature on arrival was 94.  Patient placed on Bair hugger.  Patient's temperature rechecked and is 94.5.  I rechecked his temperature myself.  She is not impacted.  Placed back under [employer]  Patient is hungry and she was able to eat and drink.  I will give the patient 20 mg of Lasix  IV in order to diurese her slowly. I will consult hospitalist for admission           Final Clinical Impression(s) / ED Diagnoses Final diagnoses:  Hypothermia, initial encounter  Congestive heart failure, unspecified HF chronicity, unspecified heart failure type (HCC)  Hyponatremia    Rx / DC Orders ED Discharge Orders     None         Flint Sonny POUR, PA-C 05/17/23 1502    Kingsley, Victoria K, DO 05/17/23 [id number]

## 2023-05-17 NOTE — Assessment & Plan Note (Addendum)
 BNP is elevated, but she does not appear overtly volume overloaded  CXR similar to previous a few days ago  Echo 12/24: normal EF, diastolic indeterminate. Mild AS, moderate tricuspid regurg  Daily weights, strict I/O Continue with gentle IV diuresis (given 20mg  in Ed, will give additional 20mg )  Consider cards consult. During last hospitalization cardiology thought could have low output contributing to hypothermia and recommended palliative care. Discussed with daughter and they did recommend hospice. She is unsure if they have been by as she has not heard from them.  Did respond well to metolazone , but renal function declined with diuresis, will continue gentle diuresis with 40mg  daily  Will monitor overnight and discuss GOC with daughter again. Depending on how aggressive, consider cardiology consult

## 2023-05-17 NOTE — ED Triage Notes (Signed)
 [facility] reports pt coming [facility] for sob. Upon EMS arrival pt was hypoxic 80% on RA and altered. Staff states pt was just sitting there and got sob and altered.

## 2023-05-17 NOTE — ED Notes (Signed)
 Provider advised about pt rectal temp, advised no bair hugger warm blankets.

## 2023-05-17 NOTE — ED Notes (Signed)
 Pt continues to take warming blanket off

## 2023-05-17 NOTE — H&P (Signed)
 History and Physical    Patient: Kimberly [name] FMW:[id number] DOB: [birth year] DOA: 05/17/2023 DOS: the patient was seen and examined on 05/17/2023 PCP: Renato Dorothey HERO, NP  Patient coming from: ALF/ILF - [facility]. She is in [facility]    Chief Complaint: unresponsive/hypoxic   HPI: Kimberly [name] is a 89 y.o. female with medical history significant of CHF, Afib on eliquis , HTN, bipolar, Ckd stage 3,  CHD s/p PPM who presented to ED after SNF found her unresponsive in her WC and checked her oxygen  and it was 80%. Per EMS sats improved with no intervention. She isn't really sure why she was brought to the hospital. She states she is hot and tired of being covered with hot blankets. She states she has a chronic cough and feels tight in her chest. She also feels like her sinuses have been bothering her.   She has lost weight, denies new orthopnea. Per her daughter she has been manic lately and likely not sleeping.    Admitted 12/29-05/12/23 for decompensated CHF. DNR with palliative care consult. Low thresh hold to consider hospice services if further decline.   Denies any fever/chills, vision changes/headaches, chest pain or palpitations, shortness of breath or cough, abdominal pain, N/V/D, dysuria.  She does feel like her legs are more swollen.   She does not smoke or drink alcohol .   ER Course:  vitals: temp: 94, bp: 134/73, HR: 63, RR:18, oxygen : 100%RA Pertinent labs: hgb: 8.0, sodium: 127, BuN; 38, creatinine: 1.09, BNP: 639,  CXR: cardiomegaly and mild pulmonary vascular congestion. Bilateral pleural effusions, left greater than right.  In ED: given 20mg  IV lasix  and TRH asked to admit.   Review of Systems: As mentioned in the history of present illness. All other systems reviewed and are negative. Past Medical History:  Diagnosis Date   Acute blood loss anemia 10/11/2012   Acute diverticulitis 08/24/2013   Acute on chronic combined systolic and diastolic CHF, NYHA class 4 (HCC) 11/15/2013    Antral ulcer 10/11/2012   Aortic atherosclerosis (HCC)    Atrial fibrillation (HCC)    Bipolar affective disorder (HCC) 03/23/2017   Cardiomyopathy, nonischemic (HCC)    Chronic anticoagulation 10/12/2012   CKD (chronic kidney disease) stage 3, GFR 30-59 ml/min (HCC) 10/12/2012   Depression    Diverticulosis    DVT (deep venous thrombosis) (HCC)    left peroneal veins/IVC Filter placed as per hospital discharge 10/24/21   Erosive esophagitis 10/11/2012   Esophagitis    Fibromyalgia    Glaucoma    H/O echocardiogram 2007   EF 40-45%,          Hiatal hernia    History of anemia due to CKD    Hypertension    Internal hemorrhoids    Nephrolithiasis    Osteoarthritis    Pacemaker    Last saw cards 07/2013   Prepyloric ulcer    Scoliosis    Tubular adenoma of colon    Past Surgical History:  Procedure Laterality Date   ABDOMINAL HYSTERECTOMY     APPENDECTOMY     BACK SURGERY     BIOPSY  03/03/2017   Procedure: BIOPSY;  Surgeon: Harvey Margo LITTIE, MD;  Location: [facility];  Service: Endoscopy;;  gastric   BREAST SURGERY     CARDIAC CATHETERIZATION  12/08/2005   LAD AND LEFT MAIN WITH NO HIGH-GRADE STENOSIS. MILD DISEASE IN THE CX AND LAD SYSTEM. SEVERE LV DYSFUNCTION WITH DILATION OF THE LV. EF 15-20%. LV END-DIASTOLIC PRESSURE  IS 90. +1 MR.   CHOLECYSTECTOMY     COLONOSCOPY N/A 03/03/2017   Dr. Harvey: 5 colon polyps removed, adenomatous.  Diverticulosis.  99% of the colon was cleared but the cecum was not adequately seen.   CYSTOSCOPY N/A 02/24/2013   Procedure: CYSTOSCOPY WITH URETHRAL DILITATION;  Surgeon: Mohammad I Javaid, MD;  Location: [facility] ORS;  Service: Urology;  Laterality: N/A;   DOPPLER ECHOCARDIOGRAPHY N/A 05/30/2010   LV SIZE IS NORMAL. LV SYSTOLIC FUNCTION IS LOW NORMAL. EF=50-55%. MILD INFERIOR HYPOKINESIS.MILD TO MODERATE POSTERIOR WALL HYPOKINESIS.PACEMAKER LEAD IN THE RV. LA IS MILDLY DILATED. RA IS MODERATE TO SEVERLY DILATED. PACEMAKER LEAD IN THE RA. MILD  CALCICICATION OF THE MV APPARATUS. MODERATE MR. MILD TO MODERATE TR. MILD PHTN.AV MILDLY SCLEROTIC.   ESOPHAGOGASTRODUODENOSCOPY N/A 10/13/2012   Dr. Shaaron: severe ulcerative reflux esophagitis, question of Barrett's but negative path, single deep prepyloric antral ulcer, negative H.pylori   ESOPHAGOGASTRODUODENOSCOPY N/A 03/03/2017   Dr. Harvey: Gastritis/duodenitis, no H. pylori   ESOPHAGOGASTRODUODENOSCOPY (EGD) WITH PROPOFOL  N/A 10/01/2021   Procedure: ESOPHAGOGASTRODUODENOSCOPY (EGD) WITH PROPOFOL ;  Surgeon: Albertus Gordy HERO, MD;  Location: [facility] ENDOSCOPY;  Service: Gastroenterology;  Laterality: N/A;   HERNIA REPAIR     right inguinal hernia and umbilical   LOWETR EXT VENOUS Bilateral 11-08-10   R & L- NO EVIDENCE OF THROMBUS OR THROMBOPHLEBITIS. THERE IS MILD AMOUNT OF SUBCUTANEOUS EDEMA NOTED WITHIN THE LEFT CALF AND ANKLE. R & L GSV AND SSV- NO VENOUS INSUFF NOTED.   NECK SURGERY     NUCLEAR STRESS TEST N/A 02/13/2009   NORMAL PATTERN OF PERFUSION IN ALL REGIONS. POST STRESS VENTICULAR SIZE IS NORMAL. POST  STESS EF 85%.  NORMAL MYOCARDIAL PERFUSION STUDY.   OPEN REDUCTION INTERNAL FIXATION (ORIF) DISTAL PHALANX Left 11/16/2018   Procedure: MIDDLE FINGER OPEN REDUCTION VERSUS RECONSTRUCTION;  Surgeon: Camella Fallow, MD;  Location: [facility] OR;  Service: Orthopedics;  Laterality: Left;   PACEMAKER INSERTION     POLYPECTOMY  03/03/2017   Procedure: POLYPECTOMY;  Surgeon: Harvey Margo CROME, MD;  Location: [facility];  Service: Endoscopy;;  colon   PPM GENERATOR CHANGEOUT N/A 06/18/2020   Procedure: PPM GENERATOR CHANGEOUT;  Surgeon: Francyne Headland, MD;  Location: [facility] INVASIVE CV LAB;  Service: Cardiovascular;  Laterality: N/A;   TONSILLECTOMY     YAG LASER APPLICATION Bilateral 11/20/2014   Procedure: YAG LASER APPLICATION;  Surgeon: Dow JULIANNA Burke, MD;  Location: [facility] ORS;  Service: Ophthalmology;  Laterality: Bilateral;   Social History:  reports that she quit smoking about 18 years ago. Her  smoking use included cigarettes. She started smoking about 48 years ago. She has a 15 pack-year smoking history. She has never used smokeless tobacco. She reports that she does not currently use alcohol . She reports that she does not use drugs.  Allergies  Allergen Reactions   Penicillins Hives    DID THE REACTION INVOLVE: Swelling of the face/tongue/throat, SOB, or low BP? Unknown  Sudden or severe rash/hives, skin peeling, or the inside of the mouth or nose? Yes  Did it require medical treatment? Yes  When did it last happen?    67 or 89 years old    If all above answers are "NO", may proceed with cephalosporin use.  DID THE REACTION INVOLVE: Swelling of the face/tongue/throat, SOB, or low BP? Unknown  Sudden or severe rash/hives, skin peeling, or the inside of the mouth or nose? Yes  Did it require medical treatment? Yes  When did it last happen?  57 or 89 years old    If all above answers are NO, may proceed with cephalosporin use.   Ceftriaxone  Itching   Camphor Other (See Comments)    Allergic, per facility   Ciprofloxacin  Diarrhea and Other (See Comments)    Possibly caused diarrhea November 2018   Flagyl  [Metronidazole ] Other (See Comments)    Possibly caused diarrhea November 2018   Papaya Derivatives Hives   Iodine Rash and Other (See Comments)    If injected,  Rash/irritated skin reaction welts   Sulfa Antibiotics Rash    Family History  Problem Relation Age of Onset   Cancer Sister    Asthma Sister    Heart failure Brother    Pulmonary embolism Brother    Cancer Brother    Colon cancer Neg Hx     Prior to Admission medications   Medication Sig Start Date End Date Taking? Authorizing Provider  apixaban  (ELIQUIS ) 2.5 MG TABS tablet Take 1 tablet (2.5 mg total) by mouth 2 (two) times daily. 09/01/22   Croitoru, Mihai, MD  BLINK TEARS 0.25 % SOLN Place 1 drop into both eyes 3 (three) times daily.    [provider]  calcium  carbonate (TUMS -  DOSED IN MG ELEMENTAL CALCIUM ) 500 MG chewable tablet Chew 1 tablet by mouth every 8 (eight) hours as needed for indigestion or heartburn.    [provider]  carvedilol  (COREG ) 6.25 MG tablet TAKE 1 TABLET BY MOUTH TWICE  DAILY WITH MEALS 12/19/22   Croitoru, Mihai, MD  cyanocobalamin  1000 MCG tablet Take 1 tablet (1,000 mcg total) by mouth daily. 05/13/23   Jadine Toribio SQUIBB, MD  dicyclomine  (BENTYL ) 20 MG tablet Take 20 mg by mouth every 8 (eight) hours as needed (for gas).    [provider]  furosemide  (LASIX ) 20 MG tablet Take 1 tablet (20 mg total) by mouth daily as needed for edema (for worsening leg swelling or shortness of breath). Patient taking differently: Take 20 mg by mouth 2 (two) times daily. 10/24/21   Arrien, Elidia Toribio, MD  mirtazapine  (REMERON ) 15 MG tablet Take 15 mg by mouth at bedtime.    [provider]  Multiple Vitamins-Minerals (OCUVITE ADULT FORMULA) CAPS Take 1 capsule by mouth in the morning.    [provider]  Multiple Vitamins-Minerals (PRESERVISION AREDS 2) CAPS Take 1 capsule by mouth every 12 (twelve) hours.    [provider]  omeprazole  (PRILOSEC ) 20 MG capsule Take 1 capsule (20 mg total) by mouth daily. Patient taking differently: Take 20 mg by mouth daily before breakfast. 12/30/22   Croitoru, Mihai, MD  polyethylene glycol (MIRALAX  / GLYCOLAX ) 17 g packet Take 17 g by mouth daily as needed. 05/12/23   Jadine Toribio SQUIBB, MD  potassium chloride  (KLOR-CON  M) 10 MEQ tablet Take 1 tablet (10 mEq total) by mouth daily as needed (take with furosemide ). Patient not taking: Reported on 05/03/2023 10/24/21   Arrien, Elidia Toribio, MD  sertraline  (ZOLOFT ) 50 MG tablet Take 50 mg by mouth in the morning.    [provider]  SYSTANE 0.4-0.3 % GEL ophthalmic gel Place 1 Application into both eyes every 8 (eight) hours as needed (for dryness or irritation).    [provider]    Physical Exam: Vitals:    05/17/23 1432 05/17/23 1500 05/17/23 1530 05/17/23 1600  BP:  (!) 110/53 107/64 (!) 123/58  Pulse:  (!) 59 (!) 58 61  Resp:  15 18 16   Temp: (!) 96.5 F (  35.8 C)     TempSrc: Temporal     SpO2:  98% 97% 100%   General:  Appears calm and comfortable and is in NAD Eyes:  PERRL, EOMI, normal lids, iris ENT:  grossly normal hearing, lips & tongue, mmm; appropriate dentition Neck:  no LAD, masses or thyromegaly; no carotid bruits Cardiovascular:  RRR, quiet systolic murmur. 1-2+ pitting edema LE edema.  Respiratory:   CTA bilaterally with no wheezes/rales/rhonchi.  Normal respiratory effort. Abdomen:  soft, NT, ND, NABS Back:   normal alignment, no CVAT Skin:  no rash or induration seen on limited exam. Mild erythema of LLL.  Musculoskeletal:  grossly normal tone BUE/BLE, good ROM, no bony abnormality Lower extremity:  No LE edema.  Limited foot exam with no ulcerations.  2+ distal pulses. Psychiatric:  grossly normal mood and affect, speech fluent and appropriate, AOx2 Neurologic:  CN 2-12 grossly intact, moves all extremities in coordinated fashion, sensation intact   Radiological Exams on Admission: Independently reviewed - see discussion in A/P where applicable  DG Chest Port 1 View Result Date: 05/17/2023 CLINICAL DATA:  Hypoxia. EXAM: PORTABLE CHEST 1 VIEW COMPARISON:  One-view chest x-ray 05/09/2023 FINDINGS: The heart is enlarged. Atherosclerotic changes are present at the aortic arch. Mild pulmonary vascular congestion is improved. Bilateral pleural effusions are present, left greater than right. Associated airspace disease at bases likely reflects atelectasis. Pacing and defibrillator wires are stable. Cervical spine fusion is noted. IMPRESSION: 1. Cardiomegaly and mild pulmonary vascular congestion. 2. Bilateral pleural effusions, left greater than right. 3. Associated airspace disease at the bases likely reflects atelectasis. Electronically Signed   By: Lonni Necessary M.D.    On: 05/17/2023 10:52    EKG: Independently reviewed.  NSR with rate 61; nonspecific ST changes with no evidence of acute ischemia-v paced.    Labs on Admission: I have personally reviewed the available labs and imaging studies at the time of the admission.  Pertinent labs:   hgb: 8.0 sodium: 127 BuN; 38 creatinine: 1.09 BNP: 639,  Assessment and Plan: Principal Problem:   Acute encephalopathy Active Problems:   Hypothermia   Acute heart failure with preserved ejection fraction (HFpEF) (HCC)   Normocytic anemia   Hyponatremia   Bipolar affective disorder (HCC)   Permanent atrial fibrillation/s/p PPM   CKD (chronic kidney disease) stage 3, GFR 30-59 ml/min (HCC)    Assessment and Plan: * Acute encephalopathy 89 year old presenting to ED after being found at her ALF unresponsive in her WC with low oxygen  to the 80s that could have been an error as her oxygen  was fine with EMS with no intervention  -obs to progressive -infectious etiology seems low on differential as urine is clean and CXR shows no consolidation. Has mild erythema on LLL from edema and was given course of doxycycline  during last hospitalization. BC pending, will check PCT -most likely secondary to hypothermia, see #2. Continue bear hugger although she is refusing this  -also daughter reports her to be manic and will get confused with this  -check  head CT for CVA,neuro exam wnl  -troponin wnl  -metabolic labs pending, B12 was low at 288 on last hospitalization will start oral replacement  -fall precautions   Hypothermia Presenting rectal temp of 93.3 Unsure etiology except age, inactivity and insufficient fuel  Her bipolar meds are remeron , zoloft  and trazodone  (not on any atypical's)  Ct head pending  Infection does not look to be cause but culture pending, checking PCT  Hx  of HFpEF, but does not look overtly volume overloaded. Cardiology was concerned about low output HF contributing to hypothermia and  could be contributing as well. Palliative care was consulted her per daughter hospice was ordered. She is unsure if they have visited as she has not heard from them.  Continue bear hugger if she allows   Acute heart failure with preserved ejection fraction (HFpEF) (HCC) BNP is elevated, but she does not appear overtly volume overloaded  CXR similar to previous a few days ago  Echo 12/24: normal EF, diastolic indeterminate. Mild AS, moderate tricuspid regurg  Daily weights, strict I/O Continue with gentle IV diuresis (given 20mg  in Ed, will give additional 20mg )  Consider cards consult. During last hospitalization cardiology thought could have low output contributing to hypothermia and recommended palliative care. Discussed with daughter and they did recommend hospice. She is unsure if they have been by as she has not heard from them.  Did respond well to metolazone , but renal function declined with diuresis, will continue gentle diuresis with 40mg  daily  Will monitor overnight and discuss GOC with daughter again. Depending on how aggressive, consider cardiology consult   Normocytic anemia -acute on chronic anemia during recent hospitalizaitaon with positive fecal occult -Seen by gastroenterology during this stay and given lack of findings on imaging and no active blood loss patient was felt to be unstable for endoscopic procedure secondary to labile blood pressures. Could consider capsule endoscopy in the future. Okay to resume anticoagulant per GI.  -hgb stable today at 8.0  -continue to trend   Hyponatremia Around baseline, continue to monitor    Bipolar affective disorder (HCC) Continue Remeron , Zoloft  and seroquel   Daughter states she has been manic lately   Permanent atrial fibrillation/s/p PPM S/p AV node ablation and PPM placement V-paced Continue coreg , eliquis    CKD (chronic kidney disease) stage 3, GFR 30-59 ml/min (HCC) Baseline creatinine appears to be around 1.2   Stable, monitor with diuresis     Advance Care Planning:   Code Status: Limited: Do not attempt resuscitation (DNR) -DNR-LIMITED -Do Not Intubate/DNI    Consults: none   DVT Prophylaxis: eliquis    Family Communication: updated [name] Molt by phone (she is POA)   Severity of Illness: The appropriate patient status for this patient is OBSERVATION. Observation status is judged to be reasonable and necessary in order to provide the required intensity of service to ensure the patient's safety. The patient's presenting symptoms, physical exam findings, and initial radiographic and laboratory data in the context of their medical condition is felt to place them at decreased risk for further clinical deterioration. Furthermore, it is anticipated that the patient will be medically stable for discharge from the hospital within 2 midnights of admission.   Author: Isaiah Geralds, MD 05/17/2023 4:09 PM  For on call review [email or web address].

## 2023-05-17 NOTE — Assessment & Plan Note (Signed)
 Baseline creatinine appears to be around 1.2  Stable, monitor with diuresis

## 2023-05-17 NOTE — Assessment & Plan Note (Addendum)
 Presenting rectal temp of 93.3 Unsure etiology except age, inactivity and insufficient fuel  Her bipolar meds are remeron , zoloft  and trazodone  (not on any atypical's)  Ct head pending  Infection does not look to be cause but culture pending, checking PCT  Hx of HFpEF, but does not look overtly volume overloaded. Cardiology was concerned about low output HF contributing to hypothermia and could be contributing as well. Palliative care was consulted her per daughter hospice was ordered. She is unsure if they have visited as she has not heard from them.  Continue bear hugger if she allows

## 2023-05-17 NOTE — Assessment & Plan Note (Signed)
-  acute on chronic anemia during recent hospitalizaitaon with positive fecal occult -Seen by gastroenterology during this stay and given lack of findings on imaging and no active blood loss patient was felt to be unstable for endoscopic procedure secondary to labile blood pressures. Could consider capsule endoscopy in the future. Okay to resume anticoagulant per GI.  -hgb stable today at 8.0  -continue to trend

## 2023-05-18 ENCOUNTER — Observation Stay (HOSPITAL_COMMUNITY)

## 2023-05-18 DIAGNOSIS — G9340 Encephalopathy, unspecified: Principal | ICD-10-CM

## 2023-05-18 LAB — CBC
HCT: 23.6 % — ABNORMAL LOW (ref 36.0–46.0)
Hemoglobin: 7.5 g/dL — ABNORMAL LOW (ref 12.0–15.0)
MCH: 25.3 pg — ABNORMAL LOW (ref 26.0–34.0)
MCHC: 31.8 g/dL (ref 30.0–36.0)
MCV: 79.7 fL — ABNORMAL LOW (ref 80.0–100.0)
Platelets: 165 10*3/uL (ref 150–400)
RBC: 2.96 MIL/uL — ABNORMAL LOW (ref 3.87–5.11)
RDW: 19 % — ABNORMAL HIGH (ref 11.5–15.5)
WBC: 4.1 10*3/uL (ref 4.0–10.5)
nRBC: 0.5 % — ABNORMAL HIGH (ref 0.0–0.2)

## 2023-05-18 LAB — BASIC METABOLIC PANEL
Anion gap: 11 (ref 5–15)
BUN: 36 mg/dL — ABNORMAL HIGH (ref 8–23)
CO2: 24 mmol/L (ref 22–32)
Calcium: 8.6 mg/dL — ABNORMAL LOW (ref 8.9–10.3)
Chloride: 95 mmol/L — ABNORMAL LOW (ref 98–111)
Creatinine, Ser: 1.3 mg/dL — ABNORMAL HIGH (ref 0.44–1.00)
GFR, Estimated: 38 mL/min — ABNORMAL LOW (ref >60)
Glucose, Bld: 81 mg/dL (ref 70–99)
Potassium: 4.1 mmol/L (ref 3.5–5.1)
Sodium: 130 mmol/L — ABNORMAL LOW (ref 135–145)

## 2023-05-18 LAB — PROCALCITONIN: Procalcitonin: <0.1 ng/mL

## 2023-05-18 MED ORDER — ORAL CARE MOUTH RINSE
15.0000 mL | OROMUCOSAL | Status: DC | PRN
Start: 2023-05-18 — End: 2023-05-18

## 2023-05-18 MED ORDER — INFLUENZA VAC A&B SURF ANT ADJ 0.5 ML IM SUSY
0.5000 mL | PREFILLED_SYRINGE | INTRAMUSCULAR | Status: DC
Start: 2023-05-19 — End: 2023-05-18

## 2023-05-18 NOTE — ED Notes (Signed)
 [id number] TO INPATIENT HANDOFF REPORT  ED Nurse Name and Phone [phone]  S Name/Age/Gender Alan LITTIE Novak 89 y.o. female Room/Bed: 017C/017C  Code Status   Code Status: Limited: Do not attempt resuscitation (DNR) -DNR-LIMITED -Do Not Intubate/DNI   Home/SNF/Other Nursing Home Patient oriented to: self, place, time, and situation Is this baseline? Yes   Triage Complete: Triage complete  Chief Complaint Hypothermia [T68.XXXA]  Triage Note [facility] reports pt coming [facility] for sob. Upon EMS arrival pt was hypoxic 80% on RA and altered. Staff states pt was just sitting there and got sob and altered.   Allergies Allergies  Allergen Reactions   Penicillins Hives    DID THE REACTION INVOLVE: Swelling of the face/tongue/throat, SOB, or low BP? Unknown  Sudden or severe rash/hives, skin peeling, or the inside of the mouth or nose? Yes  Did it require medical treatment? Yes  When did it last happen?    31 or 89 years old    If all above answers are "NO", may proceed with cephalosporin use.  DID THE REACTION INVOLVE: Swelling of the face/tongue/throat, SOB, or low BP? Unknown  Sudden or severe rash/hives, skin peeling, or the inside of the mouth or nose? Yes  Did it require medical treatment? Yes  When did it last happen?    13 or 89 years old    If all above answers are NO, may proceed with cephalosporin use.   Ceftriaxone  Itching   Camphor Other (See Comments)    Allergic, per facility   Ciprofloxacin  Diarrhea and Other (See Comments)    Possibly caused diarrhea November 2018   Flagyl  [Metronidazole ] Other (See Comments)    Possibly caused diarrhea November 2018   Papaya Derivatives Hives   Iodine Rash and Other (See Comments)    If injected,  Rash/irritated skin reaction welts   Sulfa Antibiotics Rash    Level of Care/Admitting Diagnosis ED Disposition     ED Disposition  Admit   Condition  --   Comment  Hospital Area: [facility] [[id number]]  Level of Care: Progressive [102]  Admit to Progressive based on following criteria: MULTISYSTEM THREATS such as stable sepsis, metabolic/electrolyte imbalance with or without encephalopathy that is responding to early treatment.  May place patient in observation at [facility] or [facility] if equivalent level of care is available:: Yes  Covid Evaluation: Asymptomatic - no recent exposure (last 10 days) testing not required  Diagnosis: Hypothermia [801447]  Admitting Physician: WADDELL RAKE [[id number]]  Attending Physician: WADDELL RAKE CUMMINS          B Medical/Surgery History Past Medical History:  Diagnosis Date   Acute blood loss anemia 10/11/2012   Acute diverticulitis 08/24/2013   Acute on chronic combined systolic and diastolic CHF, NYHA class 4 (HCC) 11/15/2013   Antral ulcer 10/11/2012   Aortic atherosclerosis (HCC)    Atrial fibrillation (HCC)    Bipolar affective disorder (HCC) 03/23/2017   Cardiomyopathy, nonischemic (HCC)    Chronic anticoagulation 10/12/2012   CKD (chronic kidney disease) stage 3, GFR 30-59 ml/min (HCC) 10/12/2012   Depression    Diverticulosis    DVT (deep venous thrombosis) (HCC)    left peroneal veins/IVC Filter placed as per hospital discharge 10/24/21   Erosive esophagitis 10/11/2012   Esophagitis    Fibromyalgia    Glaucoma    H/O echocardiogram 2007   EF 40-45%,          Hiatal hernia    History of anemia  due to CKD    Hypertension    Internal hemorrhoids    Nephrolithiasis    Osteoarthritis    Pacemaker    Last saw cards 07/2013   Prepyloric ulcer    Scoliosis    Tubular adenoma of colon    Past Surgical History:  Procedure Laterality Date   ABDOMINAL HYSTERECTOMY     APPENDECTOMY     BACK SURGERY     BIOPSY  03/03/2017   Procedure: BIOPSY;  Surgeon: Harvey Margo CROME, MD;  Location: [facility];  Service: Endoscopy;;  gastric   BREAST SURGERY     CARDIAC CATHETERIZATION  12/08/2005   LAD AND LEFT  MAIN WITH NO HIGH-GRADE STENOSIS. MILD DISEASE IN THE CX AND LAD SYSTEM. SEVERE LV DYSFUNCTION WITH DILATION OF THE LV. EF 15-20%. LV END-DIASTOLIC PRESSURE IS 90. +1 MR.   CHOLECYSTECTOMY     COLONOSCOPY N/A 03/03/2017   Dr. Harvey: 5 colon polyps removed, adenomatous.  Diverticulosis.  99% of the colon was cleared but the cecum was not adequately seen.   CYSTOSCOPY N/A 02/24/2013   Procedure: CYSTOSCOPY WITH URETHRAL DILITATION;  Surgeon: Mohammad I Javaid, MD;  Location: [facility] ORS;  Service: Urology;  Laterality: N/A;   DOPPLER ECHOCARDIOGRAPHY N/A 05/30/2010   LV SIZE IS NORMAL. LV SYSTOLIC FUNCTION IS LOW NORMAL. EF=50-55%. MILD INFERIOR HYPOKINESIS.MILD TO MODERATE POSTERIOR WALL HYPOKINESIS.PACEMAKER LEAD IN THE RV. LA IS MILDLY DILATED. RA IS MODERATE TO SEVERLY DILATED. PACEMAKER LEAD IN THE RA. MILD CALCICICATION OF THE MV APPARATUS. MODERATE MR. MILD TO MODERATE TR. MILD PHTN.AV MILDLY SCLEROTIC.   ESOPHAGOGASTRODUODENOSCOPY N/A 10/13/2012   Dr. Shaaron: severe ulcerative reflux esophagitis, question of Barrett's but negative path, single deep prepyloric antral ulcer, negative H.pylori   ESOPHAGOGASTRODUODENOSCOPY N/A 03/03/2017   Dr. Harvey: Gastritis/duodenitis, no H. pylori   ESOPHAGOGASTRODUODENOSCOPY (EGD) WITH PROPOFOL  N/A 10/01/2021   Procedure: ESOPHAGOGASTRODUODENOSCOPY (EGD) WITH PROPOFOL ;  Surgeon: Albertus Gordy HERO, MD;  Location: [facility] ENDOSCOPY;  Service: Gastroenterology;  Laterality: N/A;   HERNIA REPAIR     right inguinal hernia and umbilical   LOWETR EXT VENOUS Bilateral 11-08-10   R & L- NO EVIDENCE OF THROMBUS OR THROMBOPHLEBITIS. THERE IS MILD AMOUNT OF SUBCUTANEOUS EDEMA NOTED WITHIN THE LEFT CALF AND ANKLE. R & L GSV AND SSV- NO VENOUS INSUFF NOTED.   NECK SURGERY     NUCLEAR STRESS TEST N/A 02/13/2009   NORMAL PATTERN OF PERFUSION IN ALL REGIONS. POST STRESS VENTICULAR SIZE IS NORMAL. POST  STESS EF 85%.  NORMAL MYOCARDIAL PERFUSION STUDY.   OPEN REDUCTION INTERNAL FIXATION  (ORIF) DISTAL PHALANX Left 11/16/2018   Procedure: MIDDLE FINGER OPEN REDUCTION VERSUS RECONSTRUCTION;  Surgeon: Camella Fallow, MD;  Location: [facility] OR;  Service: Orthopedics;  Laterality: Left;   PACEMAKER INSERTION     POLYPECTOMY  03/03/2017   Procedure: POLYPECTOMY;  Surgeon: Harvey Margo CROME, MD;  Location: [facility];  Service: Endoscopy;;  colon   PPM GENERATOR CHANGEOUT N/A 06/18/2020   Procedure: PPM GENERATOR CHANGEOUT;  Surgeon: Francyne Headland, MD;  Location: [facility] INVASIVE CV LAB;  Service: Cardiovascular;  Laterality: N/A;   TONSILLECTOMY     YAG LASER APPLICATION Bilateral 11/20/2014   Procedure: YAG LASER APPLICATION;  Surgeon: Dow JULIANNA Burke, MD;  Location: [facility] ORS;  Service: Ophthalmology;  Laterality: Bilateral;     A IV Location/Drains/Wounds Patient Lines/Drains/Airways Status     Active Line/Drains/Airways     Name Placement date Placement time Site Days   Peripheral IV 05/17/23 18 G 1  Anterior;Left Forearm 05/17/23  1005  Forearm  1   External Urinary Catheter 05/17/23  1701  --  1            Intake/Output Last 24 hours  Intake/Output Summary (Last 24 hours) at 05/18/2023 9187 Last data filed at 05/17/2023 2100 Gross per 24 hour  Intake --  Output 900 ml  Net -900 ml    Labs/Imaging Results for orders placed or performed during the hospital encounter of 05/17/23 (from the past 48 hours)  CBC with Differential     Status: Abnormal   Collection Time: 05/17/23 10:15 AM  Result Value Ref Range   WBC 4.0 4.0 - 10.5 K/uL   RBC 3.13 (L) 3.87 - 5.11 MIL/uL   Hemoglobin 8.0 (L) 12.0 - 15.0 g/dL   HCT 74.8 (L) 63.9 - 53.9 %   MCV 80.2 80.0 - 100.0 fL   MCH 25.6 (L) 26.0 - 34.0 pg   MCHC 31.9 30.0 - 36.0 g/dL   RDW 81.2 (H) 88.4 - 84.4 %   Platelets 172 150 - 400 K/uL   nRBC 0.0 0.0 - 0.2 %   Neutrophils Relative % 69 %   Neutro Abs 2.8 1.7 - 7.7 K/uL   Lymphocytes Relative 19 %   Lymphs Abs 0.8 0.7 - 4.0 K/uL   Monocytes Relative 8 %   Monocytes  Absolute 0.3 0.1 - 1.0 K/uL   Eosinophils Relative 2 %   Eosinophils Absolute 0.1 0.0 - 0.5 K/uL   Basophils Relative 0 %   Basophils Absolute 0.0 0.0 - 0.1 K/uL   Immature Granulocytes 2 %   Abs Immature Granulocytes 0.06 0.00 - 0.07 K/uL    Comment: Performed at [facility], [address], [place], [place] [address]  Comprehensive metabolic panel     Status: Abnormal   Collection Time: 05/17/23 10:15 AM  Result Value Ref Range   Sodium 127 (L) 135 - 145 mmol/L   Potassium 4.6 3.5 - 5.1 mmol/L   Chloride 92 (L) 98 - 111 mmol/L   CO2 25 22 - 32 mmol/L   Glucose, Bld 103 (H) 70 - 99 mg/dL    Comment: Glucose reference range applies only to samples taken after fasting for at least 8 hours.   BUN 38 (H) 8 - 23 mg/dL   Creatinine, Ser 8.90 (H) 0.44 - 1.00 mg/dL   Calcium  8.7 (L) 8.9 - 10.3 mg/dL   Total Protein 6.8 6.5 - 8.1 g/dL   Albumin  3.4 (L) 3.5 - 5.0 g/dL   AST 33 15 - 41 U/L   ALT 19 0 - 44 U/L   Alkaline Phosphatase 68 38 - 126 U/L   Total Bilirubin 0.7 0.0 - 1.2 mg/dL   GFR, Estimated 47 (L) >60 mL/min    Comment: (NOTE) Calculated using the CKD-EPI Creatinine Equation (2021)    Anion gap 10 5 - 15    Comment: Performed at [facility], [address], [place], [place] [address]  Troponin I (High Sensitivity)     Status: None   Collection Time: 05/17/23 10:15 AM  Result Value Ref Range   Troponin I (High Sensitivity) 14 <18 ng/L    Comment: (NOTE) Elevated high sensitivity troponin I (hsTnI) values and significant  changes across serial measurements may suggest ACS but many other  chronic and acute conditions are known to elevate hsTnI results.  Refer to the Links section for chest pain algorithms and additional  guidance. Performed at [facility], [address] [place], [place] [address]   Brain natriuretic peptide     Status: Abnormal   Collection Time: 05/17/23 10:15 AM  Result Value Ref Range   B Natriuretic Peptide 639.1 (H) 0.0 -  100.0 pg/mL    Comment: Performed at [facility], [address], [place], [place] [address]  Troponin I (High Sensitivity)     Status: None   Collection Time: 05/17/23  1:11 PM  Result Value Ref Range   Troponin I (High Sensitivity) 12 <18 ng/L    Comment: (NOTE) Elevated high sensitivity troponin I (hsTnI) values and significant  changes across serial measurements may suggest ACS but many other  chronic and acute conditions are known to elevate hsTnI results.  Refer to the Links section for chest pain algorithms and additional  guidance. Performed at [facility], [address] [place], [place] [address]   Urinalysis, Routine w reflex microscopic -Urine, Clean Catch     Status: Abnormal   Collection Time: 05/17/23  2:10 PM  Result Value Ref Range   Color, Urine STRAW (A) YELLOW   APPearance CLEAR CLEAR   Specific Gravity, Urine 1.005 1.005 - 1.030   pH 7.0 5.0 - 8.0   Glucose, UA NEGATIVE NEGATIVE mg/dL   Hgb urine dipstick NEGATIVE NEGATIVE   Bilirubin Urine NEGATIVE NEGATIVE   Ketones, ur NEGATIVE NEGATIVE mg/dL   Protein, ur NEGATIVE NEGATIVE mg/dL   Nitrite NEGATIVE NEGATIVE   Leukocytes,Ua NEGATIVE NEGATIVE    Comment: Performed at [facility], [address], [place], [place] [address]  Culture, blood (Routine X 2) w Reflex to ID Panel     Status: None (Preliminary result)   Collection Time: 05/17/23  3:03 PM   Specimen: BLOOD  Result Value Ref Range   Specimen Description BLOOD BLOOD LEFT HAND    Special Requests      BOTTLES DRAWN AEROBIC AND ANAEROBIC Blood Culture results may not be optimal due to an inadequate volume of blood received in culture bottles   Culture      NO GROWTH < 12 HOURS Performed at [facility], [address], [place], [place] [address]    Report Status PENDING   TSH     Status: Abnormal   Collection Time: 05/17/23  3:07 PM  Result Value Ref Range   TSH 6.268 (H) 0.350 - 4.500 uIU/mL    Comment: Performed by  a 3rd Generation assay with a functional sensitivity of <=0.01 uIU/mL. Performed at [facility], [address], [place], [place] [address]   SARS Coronavirus 2 by RT PCR (hospital order, performed in [facility] hospital lab) *cepheid single result test* Anterior Nasal Swab     Status: None   Collection Time: 05/17/23  3:28 PM   Specimen: Anterior Nasal Swab  Result Value Ref Range   SARS Coronavirus 2 by RT PCR NEGATIVE NEGATIVE    Comment: Performed at [facility], [address], [place], [place] [address]  Procalcitonin     Status: None   Collection Time: 05/17/23  3:30 PM  Result Value Ref Range   Procalcitonin <0.10 ng/mL    Comment:        Interpretation: PCT (Procalcitonin) <= 0.5 ng/mL: Systemic infection (sepsis) is not likely. Local bacterial infection is possible. (NOTE)       Sepsis PCT Algorithm           Lower Respiratory Tract  Infection PCT Algorithm    ----------------------------     ----------------------------         PCT < 0.25 ng/mL                PCT < 0.10 ng/mL          Strongly encourage             Strongly discourage   discontinuation of antibiotics    initiation of antibiotics    ----------------------------     -----------------------------       PCT 0.25 - 0.50 ng/mL            PCT 0.10 - 0.25 ng/mL               OR       >80% decrease in PCT            Discourage initiation of                                            antibiotics      Encourage discontinuation           of antibiotics    ----------------------------     -----------------------------         PCT >= 0.50 ng/mL              PCT 0.26 - 0.50 ng/mL               AND        <80% decrease in PCT             Encourage initiation of                                             antibiotics       Encourage continuation           of antibiotics    ----------------------------     -----------------------------        PCT >= 0.50 ng/mL                   PCT > 0.50 ng/mL               AND         increase in PCT                  Strongly encourage                                      initiation of antibiotics    Strongly encourage escalation           of antibiotics                                     -----------------------------                                           PCT <= 0.25 ng/mL  OR                                        > 80% decrease in PCT                                      Discontinue / Do not initiate                                             antibiotics  Performed at [facility], [address] [place], [place] [address]   Vitamin B12     Status: Abnormal   Collection Time: 05/17/23  3:39 PM  Result Value Ref Range   Vitamin B-12 1,351 (H) 180 - 914 pg/mL    Comment: (NOTE) This assay is not validated for testing neonatal or myeloproliferative syndrome specimens for Vitamin B12 levels. Performed at [facility], [address] [place], [place] [address]   Culture, blood (Routine X 2) w Reflex to ID Panel     Status: None (Preliminary result)   Collection Time: 05/17/23  8:38 PM   Specimen: BLOOD  Result Value Ref Range   Specimen Description BLOOD BLOOD LEFT HAND    Special Requests      BOTTLES DRAWN AEROBIC AND ANAEROBIC Blood Culture results may not be optimal due to an inadequate volume of blood received in culture bottles   Culture      NO GROWTH < 12 HOURS Performed at [facility], [address], [place], [place] [address]    Report Status PENDING   Basic metabolic panel     Status: Abnormal   Collection Time: 05/18/23  6:39 AM  Result Value Ref Range   Sodium 130 (L) 135 - 145 mmol/L   Potassium 4.1 3.5 - 5.1 mmol/L   Chloride 95 (L) 98 - 111 mmol/L   CO2 24 22 - 32 mmol/L   Glucose, Bld 81 70 - 99 mg/dL    Comment: Glucose reference range applies only to samples taken after fasting for at least 8 hours.   BUN  36 (H) 8 - 23 mg/dL   Creatinine, Ser 8.69 (H) 0.44 - 1.00 mg/dL   Calcium  8.6 (L) 8.9 - 10.3 mg/dL   GFR, Estimated 38 (L) >60 mL/min    Comment: (NOTE) Calculated using the CKD-EPI Creatinine Equation (2021)    Anion gap 11 5 - 15    Comment: Performed at [facility], [address] [place], [place] [address]  CBC     Status: Abnormal   Collection Time: 05/18/23  6:39 AM  Result Value Ref Range   WBC 4.1 4.0 - 10.5 K/uL   RBC 2.96 (L) 3.87 - 5.11 MIL/uL   Hemoglobin 7.5 (L) 12.0 - 15.0 g/dL   HCT 76.3 (L) 63.9 - 53.9 %   MCV 79.7 (L) 80.0 - 100.0 fL   MCH 25.3 (L) 26.0 - 34.0 pg   MCHC 31.8 30.0 - 36.0 g/dL   RDW 80.9 (H) 88.4 - 84.4 %   Platelets 165 150 - 400 K/uL   nRBC 0.5 (H) 0.0 - 0.2 %    Comment: Performed at [facility], [address] [place], [place] [address]   *Note: Due to a large number of results and/or encounters for the requested time period, some results have not been displayed. A complete set of results can be found in Results Review.   CT HEAD WO CONTRAST ( ) Result Date: 05/17/2023 CLINICAL DATA:  Delirium EXAM: CT HEAD WITHOUT CONTRAST TECHNIQUE: Contiguous axial images were obtained from the base of the skull through the vertex without intravenous contrast. RADIATION DOSE REDUCTION: This exam was performed according to the departmental dose-optimization program which includes automated exposure control, adjustment of the mA and/or kV according to patient size and/or use of iterative reconstruction technique. COMPARISON:  01/01/2023 FINDINGS: Brain: No evidence of acute infarction, hemorrhage, mass, mass effect, or midline shift. No hydrocephalus or extra-axial fluid collection. Normal cerebral volume for age. Vascular: No hyperdense vessel. Skull: Negative for fracture or focal lesion. Sinuses/Orbits: Mucosal thickening in the left maxillary sinus. Status post bilateral lens replacements. Other: The mastoid air cells are well aerated. IMPRESSION:  No acute intracranial process. Electronically Signed   By: Donald Campion M.D.   On: 05/17/2023 16:49   DG Chest Port 1 View Result Date: 05/17/2023 CLINICAL DATA:  Hypoxia. EXAM: PORTABLE CHEST 1 VIEW COMPARISON:  One-view chest x-ray 05/09/2023 FINDINGS: The heart is enlarged. Atherosclerotic changes are present at the aortic arch. Mild pulmonary vascular congestion is improved. Bilateral pleural effusions are present, left greater than right. Associated airspace disease at bases likely reflects atelectasis. Pacing and defibrillator wires are stable. Cervical spine fusion is noted. IMPRESSION: 1. Cardiomegaly and mild pulmonary vascular congestion. 2. Bilateral pleural effusions, left greater than right. 3. Associated airspace disease at the bases likely reflects atelectasis. Electronically Signed   By: Lonni Necessary M.D.   On: 05/17/2023 10:52    Pending Labs Unresulted Labs (From admission, onward)     Start     Ordered   05/17/23 1508  Procalcitonin  Daily,   R     References:    Procalcitonin Lower Respiratory Tract Infection AND Sepsis Procalcitonin Algorithm   05/17/23 1507            Vitals/Pain Today's Vitals   05/18/23 0300 05/18/23 0400 05/18/23 0600 05/18/23 0802  BP: 133/66 (!) 135/93 (!) 127/59 (!) 129/59  Pulse: 61 61 60 61  Resp: 16 16 19 20   Temp:   (!) 97.4 F (36.3 C) 97.7 F (36.5 C)  TempSrc:   Oral Oral  SpO2: 100% 100% 97% 99%  PainSc:    0-No pain    Isolation Precautions Airborne and Contact precautions  Medications Medications  vitamin B-12 (CYANOCOBALAMIN ) tablet 500 mcg (500 mcg Oral Given 05/17/23 1651)  acetaminophen  (TYLENOL ) tablet 650 mg (has no administration in time range)    Or  acetaminophen  (TYLENOL ) suppository 650 mg (has no administration in time range)  ondansetron  (ZOFRAN ) tablet 4 mg (has no administration in time range)    Or  ondansetron  (ZOFRAN ) injection 4 mg (has no administration in time range)  carvedilol  (COREG )  tablet 6.25 mg (has no administration in time range)  sertraline  (ZOLOFT ) tablet 50 mg (50 mg Oral Given 05/18/23 0631)  QUEtiapine  (SEROQUEL ) tablet 25 mg (25 mg Oral Given 05/17/23 2124)  mirtazapine  (REMERON ) tablet 15 mg (15 mg Oral Given 05/17/23 2124)  pantoprazole  (PROTONIX ) EC tablet 40 mg (has no administration in time range)  apixaban  (ELIQUIS ) tablet 2.5 mg (2.5 mg Oral Given 05/17/23 2124)  melatonin tablet 3 mg (3 mg Oral Given 05/17/23  2124)  multivitamin (PROSIGHT) tablet 1 tablet (has no administration in time range)  furosemide  (LASIX ) injection 40 mg (has no administration in time range)  furosemide  (LASIX ) injection 20 mg (20 mg Intravenous Given 05/17/23 1440)  furosemide  (LASIX ) injection 20 mg (20 mg Intravenous Given 05/17/23 1652)    Mobility walks with person assist     Focused Assessments    R Recommendations: See Admitting Provider Note  Report given to:   Additional Notes:

## 2023-05-18 NOTE — Discharge Summary (Signed)
 Physician Discharge Summary  LOUANNA VANLIEW FMW:[id number] DOB: [birth year] DOA: 05/17/2023  PCP: Renato Dorothey HERO, NP  Admit date: 05/17/2023 Discharge date: 05/18/2023  Admitted From: SNF Disposition: SNF  Recommendations for Outpatient Follow-up:  Follow up with PCP in 1-2 weeks Please discuss medication adjustment in regards to sleep and depression per discussion with daughter  Discharge Condition: Stable CODE STATUS: DNR Diet recommendation: As tolerated, regular diet  Brief/Interim Summary: Kimberly [name] is a 89 y.o. female with medical history significant of CHF, Afib on eliquis , HTN, bipolar, Ckd stage 3,  CHD s/p PPM who presented to ED after being found unresponsive in a wheelchair with hypoxia into the 80s.  Upon admission patient also noted to be somewhat hypothermic.  Patient's mental status, hypoxia and hypothermia resolved rather quickly overnight, she is now back to baseline, discussed patient with [name] lives in [place]  that she appears to be back to baseline and is otherwise stable for discharge back to facility.  Daughter is worried patient's depression/mania are poorly controlled as is her sleep hygiene, defer back to PCP for further management but increasing patient's low-dose Seroquel  would not be unreasonable if tolerated.  Discharge Diagnoses:  Principal Problem:   Acute encephalopathy Active Problems:   Hypothermia   Acute heart failure with preserved ejection fraction (HFpEF) (HCC)   Normocytic anemia   Hyponatremia   Bipolar affective disorder (HCC)   Permanent atrial fibrillation/s/p PPM   CKD (chronic kidney disease) stage 3, GFR 30-59 ml/min [facility])    Discharge Instructions  Discharge Instructions     Call MD for:  difficulty breathing, headache or visual disturbances   Complete by: As directed    Call MD for:  extreme fatigue   Complete by: As directed    Call MD for:  persistant dizziness or light-headedness   Complete by: As  directed    Call MD for:  persistant nausea and vomiting   Complete by: As directed    Call MD for:  redness, tenderness, or signs of infection (pain, swelling, redness, odor or green/yellow discharge around incision site)   Complete by: As directed    Call MD for:  severe uncontrolled pain   Complete by: As directed    Call MD for:  temperature >100.4   Complete by: As directed    Diet - low sodium heart healthy   Complete by: As directed    Discharge instructions   Complete by: As directed    Discussed medication changes per daughter request in regards to patient's poorly controlled depression and sleep hygiene.  We discussed patient's low-dose Seroquel  might improve her sleep hygiene and she is high risk for Ambien /CNS depressants given her age and fall risk.  Patient also needs follow-up with palliative care team in the next few days to week as able to be scheduled.   Increase activity slowly   Complete by: As directed       Allergies as of 05/18/2023       Reactions   Penicillins Hives   DID THE REACTION INVOLVE: Swelling of the face/tongue/throat, SOB, or low BP? Unknown Sudden or severe rash/hives, skin peeling, or the inside of the mouth or nose? Yes Did it require medical treatment? Yes When did it last happen?    60 or 89 years old   If all above answers are "NO", may proceed with cephalosporin use. DID THE REACTION INVOLVE: Swelling of the face/tongue/throat, SOB, or low BP? Unknown  Sudden or severe rash/hives, skin peeling,  or the inside of the mouth or nose? Yes  Did it require medical treatment? Yes  When did it last happen?    41 or 89 years old    If all above answers are NO, may proceed with cephalosporin use.   Ceftriaxone  Itching   Camphor Other (See Comments)   Allergic, per facility   Ciprofloxacin  Diarrhea, Other (See Comments)   Possibly caused diarrhea November 2018   Flagyl  [metronidazole ] Other (See Comments)   Possibly caused diarrhea November  2018   Papaya Derivatives Hives   Iodine Rash, Other (See Comments)   If injected,  Rash/irritated skin reaction welts   Sulfa Antibiotics Rash        Medication List     TAKE these medications    apixaban  2.5 MG Tabs tablet Commonly known as: ELIQUIS  Take 1 tablet (2.5 mg total) by mouth 2 (two) times daily.   carvedilol  6.25 MG tablet Commonly known as: COREG  TAKE 1 TABLET BY MOUTH TWICE  DAILY WITH MEALS   cyanocobalamin  1000 MCG tablet Take 1 tablet (1,000 mcg total) by mouth daily.   furosemide  20 MG tablet Commonly known as: LASIX  Take 1 tablet (20 mg total) by mouth daily as needed for edema (for worsening leg swelling or shortness of breath). What changed: when to take this   Melatonin 3 MG Caps Take 3 mg by mouth at bedtime.   mirtazapine  15 MG tablet Commonly known as: REMERON  Take 15 mg by mouth at bedtime.   omeprazole  20 MG capsule Commonly known as: PRILOSEC  Take 1 capsule (20 mg total) by mouth daily. What changed: when to take this   potassium chloride  10 MEQ tablet Commonly known as: KLOR-CON  M Take 1 tablet (10 mEq total) by mouth daily as needed (take with furosemide ).   PreserVision AREDS 2 Caps Take 1 capsule by mouth every 12 (twelve) hours.   Ocuvite Adult Formula Caps Take 1 capsule by mouth in the morning.   QUEtiapine  25 MG tablet Commonly known as: SEROQUEL  Take 25 mg by mouth 2 (two) times daily.   sertraline  50 MG tablet Commonly known as: ZOLOFT  Take 50 mg by mouth in the morning.        Follow-up Information     [facility] Follow up.   Why: Agency will contact you to set up apt times Contact information: [address] [place] [address] [phone]                Allergies  Allergen Reactions   Penicillins Hives    DID THE REACTION INVOLVE: Swelling of the face/tongue/throat, SOB, or low BP? Unknown  Sudden or severe rash/hives, skin peeling, or the inside of the mouth or  nose? Yes  Did it require medical treatment? Yes  When did it last happen?    40 or 89 years old    If all above answers are "NO", may proceed with cephalosporin use.  DID THE REACTION INVOLVE: Swelling of the face/tongue/throat, SOB, or low BP? Unknown  Sudden or severe rash/hives, skin peeling, or the inside of the mouth or nose? Yes  Did it require medical treatment? Yes  When did it last happen?    10 or 89 years old    If all above answers are NO, may proceed with cephalosporin use.   Ceftriaxone  Itching   Camphor Other (See Comments)    Allergic, per facility   Ciprofloxacin  Diarrhea and Other (See Comments)    Possibly caused diarrhea  November 2018   Flagyl  [Metronidazole ] Other (See Comments)    Possibly caused diarrhea November 2018   Papaya Derivatives Hives   Iodine Rash and Other (See Comments)    If injected,  Rash/irritated skin reaction welts   Sulfa Antibiotics Rash    Consultations: None  Procedures/Studies: CT HEAD WO CONTRAST ( ) Result Date: 05/17/2023 CLINICAL DATA:  Delirium EXAM: CT HEAD WITHOUT CONTRAST TECHNIQUE: Contiguous axial images were obtained from the base of the skull through the vertex without intravenous contrast. RADIATION DOSE REDUCTION: This exam was performed according to the departmental dose-optimization program which includes automated exposure control, adjustment of the mA and/or kV according to patient size and/or use of iterative reconstruction technique. COMPARISON:  01/01/2023 FINDINGS: Brain: No evidence of acute infarction, hemorrhage, mass, mass effect, or midline shift. No hydrocephalus or extra-axial fluid collection. Normal cerebral volume for age. Vascular: No hyperdense vessel. Skull: Negative for fracture or focal lesion. Sinuses/Orbits: Mucosal thickening in the left maxillary sinus. Status post bilateral lens replacements. Other: The mastoid air cells are well aerated. IMPRESSION: No acute intracranial process.  Electronically Signed   By: Donald Campion M.D.   On: 05/17/2023 16:49   DG Chest Port 1 View Result Date: 05/17/2023 CLINICAL DATA:  Hypoxia. EXAM: PORTABLE CHEST 1 VIEW COMPARISON:  One-view chest x-ray 05/09/2023 FINDINGS: The heart is enlarged. Atherosclerotic changes are present at the aortic arch. Mild pulmonary vascular congestion is improved. Bilateral pleural effusions are present, left greater than right. Associated airspace disease at bases likely reflects atelectasis. Pacing and defibrillator wires are stable. Cervical spine fusion is noted. IMPRESSION: 1. Cardiomegaly and mild pulmonary vascular congestion. 2. Bilateral pleural effusions, left greater than right. 3. Associated airspace disease at the bases likely reflects atelectasis. Electronically Signed   By: Lonni Necessary M.D.   On: 05/17/2023 10:52   CUP PACEART REMOTE DEVICE CHECK Result Date: 05/13/2023 Scheduled remote reviewed. Normal device function.  HF diagnostic abnormal since September 2024. Next remote 91 days. ML, CVRS  DG CHEST PORT 1 VIEW Result Date: 05/09/2023 CLINICAL DATA:  CHF EXAM: PORTABLE CHEST 1 VIEW COMPARISON:  X-ray 05/03/2023. FINDINGS: Enlarged cardiopericardial silhouette with calcified aorta. Vascular congestion and some trace edema. Bilateral pleural effusions also seen. Left upper chest defibrillator. Calcified aorta. Fixation hardware along the lower cervical spine. Bilateral apical pleural thickening as well as some pleural calcification. Film is under penetrated. IMPRESSION: Enlarged heart with defibrillator. Vascular congestion with trace edema. Increasing small pleural effusions. Recommend follow-up. Electronically Signed   By: Ranell Bring M.D.   On: 05/09/2023 14:56   CT ABDOMEN PELVIS WO CONTRAST Result Date: 05/05/2023 CLINICAL DATA:  Anemia. Fall a possible splenic flexure diverticulitis. EXAM: CT ABDOMEN AND PELVIS WITHOUT CONTRAST TECHNIQUE: Multidetector CT imaging of the abdomen and  pelvis was performed following the standard protocol without IV contrast. RADIATION DOSE REDUCTION: This exam was performed according to the departmental dose-optimization program which includes automated exposure control, adjustment of the mA and/or kV according to patient size and/or use of iterative reconstruction technique. COMPARISON:  CT 12/23/2022 FINDINGS: Lower chest: Small bilateral pleural effusions, right greater than left. Increased atelectasis in the dependent lower lobes. The heart is enlarged. Coronary artery calcifications. Hepatobiliary: Stable cysts in the liver. No new liver abnormality. Cholecystectomy. Stable biliary tree. Pancreas: Atrophic.  No ductal dilatation or inflammation. Spleen: Calcified granuloma.  No splenomegaly. Adrenals/Urinary Tract: No adrenal nodules. No hydronephrosis. Punctate stone in the lower pole of the right kidney. No ureteral stone. Physiologically distended urinary bladder  with multiple diverticula. Stomach/Bowel: Colonic diverticulosis, extensive involving the sigmoid. No diverticulitis or acute colonic inflammation. Particularly, no evidence of splenic diverticulitis. No small bowel obstruction. There is a moderate hiatal hernia. Appendix not confidently seen. Vascular/Lymphatic: Advanced aortic and branch atherosclerosis. No portal venous or mesenteric gas. No adenopathy. Reproductive: Status post hysterectomy. No adnexal masses. Other: No free air or ascites. Laxity of the anterior abdominal wall. Small supraumbilical ventral abdominal wall hernia contains only fat. Musculoskeletal: Severe scoliosis and degenerative change in the spine. Chronic T12 superior endplate compression deformity. IMPRESSION: 1. Colonic diverticulosis without diverticulitis. Specifically, no diverticulitis of the splenic flexure. 2. Moderate hiatal hernia. 3. Small bilateral pleural effusions, right greater than left. Increased atelectasis in the dependent lower lobes since August. 4.  Cardiomegaly. 5. Punctate nonobstructing right renal stone. Aortic Atherosclerosis (ICD10-I70.0). Electronically Signed   By: Andrea Gasman M.D.   On: 05/05/2023 02:09   ECHOCARDIOGRAM COMPLETE Result Date: 05/04/2023    ECHOCARDIOGRAM REPORT   Patient Name:   FONTELLA SHAN Date of Exam: 05/04/2023 Medical Rec #:  [id number]     Height:       67.0 in Accession #:    [id number]    Weight:       125.0 lb Date of Birth:  03-18-1927     BSA:          1.656 m Patient Age:    89 years      BP:           147/79 mmHg Patient Gender: F             HR:           60 bpm. Exam Location:  Inpatient Procedure: 2D Echo, Cardiac Doppler and Color Doppler Indications:    Congestive Heart Failure I50.9  History:        Patient has prior history of Echocardiogram examinations, most                 recent 11/21/2019. CHF and Cardiomyopathy; Arrythmias:Atrial                 Fibrillation.  Sonographer:    Jayson Gaskins Referring Phys: [id number] ROBERT DORRELL IMPRESSIONS  1. Left ventricular ejection fraction, by estimation, is 50 to 55%. The left ventricle has low normal function. The left ventricle has no regional wall motion abnormalities. Left ventricular diastolic parameters are indeterminate. Elevated left ventricular end-diastolic pressure.  2. Right ventricular systolic function is normal. The right ventricular size is normal.  3. Left atrial size was severely dilated.  4. Right atrial size was severely dilated.  5. The mitral valve is normal in structure. Mild mitral valve regurgitation. No evidence of mitral stenosis.  6. Tricuspid valve regurgitation is moderate.  7. The aortic valve is tricuspid. There is mild calcification of the aortic valve. There is mild thickening of the aortic valve. Aortic valve regurgitation is not visualized. Mild aortic valve stenosis. FINDINGS  Left Ventricle: Left ventricular ejection fraction, by estimation, is 50 to 55%. The left ventricle has low normal function. The left ventricle has no  regional wall motion abnormalities. The left ventricular internal cavity size was normal in size. There is no left ventricular hypertrophy. Left ventricular diastolic parameters are indeterminate. Elevated left ventricular end-diastolic pressure. Right Ventricle: The right ventricular size is normal. No increase in right ventricular wall thickness. Right ventricular systolic function is normal. Left Atrium: Left atrial size was severely dilated. Right Atrium: Right atrial size was severely dilated.  Pericardium: There is no evidence of pericardial effusion. Mitral Valve: The mitral valve is normal in structure. Mild mitral valve regurgitation. No evidence of mitral valve stenosis. Tricuspid Valve: The tricuspid valve is normal in structure. Tricuspid valve regurgitation is moderate . No evidence of tricuspid stenosis. Aortic Valve: The aortic valve is tricuspid. There is mild calcification of the aortic valve. There is mild thickening of the aortic valve. Aortic valve regurgitation is not visualized. Mild aortic stenosis is present. Aortic valve mean gradient measures  4.0 mmHg. Aortic valve peak gradient measures 7.1 mmHg. Aortic valve area, by VTI measures 1.14 cm. Pulmonic Valve: The pulmonic valve was normal in structure. Pulmonic valve regurgitation is not visualized. No evidence of pulmonic stenosis. Aorta: The aortic root is normal in size and structure. IAS/Shunts: No atrial level shunt detected by color flow Doppler. Additional Comments: A device lead is visualized.  LEFT VENTRICLE PLAX 2D LVIDd:         4.30 cm   Diastology LVIDs:         3.10 cm   LV e' medial:    5.33 cm/s LV PW:         1.00 cm   LV E/e' medial:  18.7 LV IVS:        1.00 cm   LV e' lateral:   6.53 cm/s LVOT diam:     1.70 cm   LV E/e' lateral: 15.3 LV SV:         35 LV SV Index:   21 LVOT Area:     2.27 cm  LEFT ATRIUM             Index LA Vol (A2C):   71.3 ml 43.05 ml/m LA Vol (A4C):   76.0 ml 45.89 ml/m LA Biplane Vol: 74.9 ml  45.23 ml/m  AORTIC VALVE AV Area (Vmax):    1.49 cm AV Area (Vmean):   1.20 cm AV Area (VTI):     1.14 cm AV Vmax:           133.00 cm/s AV Vmean:          90.200 cm/s AV VTI:            0.307 m AV Peak Grad:      7.1 mmHg AV Mean Grad:      4.0 mmHg LVOT Vmax:         87.50 cm/s LVOT Vmean:        47.800 cm/s LVOT VTI:          0.154 m LVOT/AV VTI ratio: 0.50  AORTA Ao Root diam: 3.20 cm MITRAL VALVE               TRICUSPID VALVE MV Area (PHT): 2.59 cm    TR Peak grad:   32.3 mmHg MV Decel Time: 293 msec    TR Vmax:        284.00 cm/s MV E velocity: 99.80 cm/s                            SHUNTS                            Systemic VTI:  0.15 m                            Systemic Diam: 1.70 cm Annabella Scarce MD Electronically signed  by Annabella Scarce MD Signature Date/Time: 05/04/2023/5:01:17 PM    Final    DG Chest Port 1 View Result Date: 05/03/2023 CLINICAL DATA:  Dizziness and shortness of breath. Weeping edema both lower extremities. EXAM: PORTABLE CHEST 1 VIEW COMPARISON:  Portable chest 01/05/2023, 01/01/2023, 12/23/2022 FINDINGS: Left chest dual lead pacemaker, wiring and additional AID wire are unaltered. The heart is enlarged. There is increased central vascular prominence and flow cephalization, interval development of mild basilar and central interstitial edema, small pleural effusions. No focal airspace disease is seen. Bilateral diaphragmatic eventrations are again noted. The aorta is tortuous and calcified with stable mediastinum. Chronic biapical pleural-parenchymal scarring and calcifications are again shown. No new osseous findings. Osteopenia and degenerative change of the spine. IMPRESSION: 1. Cardiomegaly with increased central vascular prominence and flow cephalization, interval development of mild basilar and central interstitial edema, small pleural effusions. Findings are consistent with CHF. 2. Aortic atherosclerosis with heavy calcification, uncoiling. Electronically Signed    By: Francis Quam M.D.   On: 05/03/2023 22:05     Subjective: No acute issues or events overnight, back to baseline denies nausea vomiting diarrhea constipation headache fevers chills chest pain shortness of breath   Discharge Exam: Vitals:   05/18/23 0802 05/18/23 0946  BP: (!) 129/59 133/67  Pulse: 61 65  Resp: 20 20  Temp: 97.7 F (36.5 C) (!) 96.5 F (35.8 C)  SpO2: 99% 99%   Vitals:   05/18/23 0400 05/18/23 0600 05/18/23 0802 05/18/23 0946  BP: (!) 135/93 (!) 127/59 (!) 129/59 133/67  Pulse: 61 60 61 65  Resp: 16 19 20 20   Temp:  (!) 97.4 F (36.3 C) 97.7 F (36.5 C) (!) 96.5 F (35.8 C)  TempSrc:  Oral Oral Axillary  SpO2: 100% 97% 99% 99%  Weight:    59.7 kg  Height:    5' 7 (1.702 m)    General: Pt is alert, awake, not in acute distress Cardiovascular: RRR, S1/S2 +, no rubs, no gallops Respiratory: CTA bilaterally, no wheezing, no rhonchi Abdominal: Soft, NT, ND, bowel sounds + Extremities: no edema, no cyanosis    The results of significant diagnostics from this hospitalization (including imaging, microbiology, ancillary and laboratory) are listed below for reference.     Microbiology: Recent Results (from the past 240 hours)  Culture, blood (Routine X 2) w Reflex to ID Panel     Status: None (Preliminary result)   Collection Time: 05/17/23  3:03 PM   Specimen: BLOOD  Result Value Ref Range Status   Specimen Description BLOOD BLOOD LEFT HAND  Final   Special Requests   Final    BOTTLES DRAWN AEROBIC AND ANAEROBIC Blood Culture results may not be optimal due to an inadequate volume of blood received in culture bottles   Culture   Final    NO GROWTH < 12 HOURS Performed at [facility], [address], [place], [place] [address]    Report Status PENDING  Incomplete  SARS Coronavirus 2 by RT PCR (hospital order, performed in [facility] hospital lab) *cepheid single result test* Anterior Nasal Swab     Status: None   Collection Time: 05/17/23   3:28 PM   Specimen: Anterior Nasal Swab  Result Value Ref Range Status   SARS Coronavirus 2 by RT PCR NEGATIVE NEGATIVE Final    Comment: Performed at [facility], [address] [place], [place] [address]  Culture, blood (Routine X 2) w Reflex to ID Panel  Status: None (Preliminary result)   Collection Time: 05/17/23  8:38 PM   Specimen: BLOOD  Result Value Ref Range Status   Specimen Description BLOOD BLOOD LEFT HAND  Final   Special Requests   Final    BOTTLES DRAWN AEROBIC AND ANAEROBIC Blood Culture results may not be optimal due to an inadequate volume of blood received in culture bottles   Culture   Final    NO GROWTH < 12 HOURS Performed at [facility], [address], [place], [place] [address]    Report Status PENDING  Incomplete     Labs: BNP (last 3 results) Recent Labs    05/03/23 1850 05/09/23 0607 05/17/23 1015  BNP 598.0* 593.6* 639.1*   Basic Metabolic Panel: Recent Labs  Lab 05/17/23 1015 05/18/23 0639  NA 127* 130*  K 4.6 4.1  CL 92* 95*  CO2 25 24  GLUCOSE 103* 81  BUN 38* 36*  CREATININE 1.09* 1.30*  CALCIUM  8.7* 8.6*   Liver Function Tests: Recent Labs  Lab 05/17/23 1015  AST 33  ALT 19  ALKPHOS 68  BILITOT 0.7  PROT 6.8  ALBUMIN  3.4*   No results for input(s): LIPASE, AMYLASE in the last 168 hours. No results for input(s): AMMONIA in the last 168 hours. CBC: Recent Labs  Lab 05/17/23 1015 05/18/23 0639  WBC 4.0 4.1  NEUTROABS 2.8  --   HGB 8.0* 7.5*  HCT 25.1* 23.6*  MCV 80.2 79.7*  PLT 172 165   Cardiac Enzymes: No results for input(s): CKTOTAL, CKMB, CKMBINDEX, TROPONINI in the last 168 hours. BNP: Invalid input(s): POCBNP CBG: Recent Labs  Lab 05/12/23 0744  GLUCAP 76   D-Dimer No results for input(s): DDIMER in the last 72 hours. Hgb A1c No results for input(s): HGBA1C in the last 72 hours. Lipid Profile No results for input(s): CHOL, HDL, LDLCALC, TRIG,  CHOLHDL, LDLDIRECT in the last 72 hours. Thyroid  function studies Recent Labs    05/17/23 1507  TSH 6.268*   Anemia work up Recent Labs    05/17/23 1539  VITAMINB12 1,351*   Urinalysis    Component Value Date/Time   COLORURINE STRAW (A) 05/17/2023 1410   APPEARANCEUR CLEAR 05/17/2023 1410   LABSPEC 1.005 05/17/2023 1410   PHURINE 7.0 05/17/2023 1410   GLUCOSEU NEGATIVE 05/17/2023 1410   HGBUR NEGATIVE 05/17/2023 1410   BILIRUBINUR NEGATIVE 05/17/2023 1410   BILIRUBINUR neg 04/25/2014 0855   KETONESUR NEGATIVE 05/17/2023 1410   PROTEINUR NEGATIVE 05/17/2023 1410   UROBILINOGEN 0.2 05/26/2014 1415   NITRITE NEGATIVE 05/17/2023 1410   LEUKOCYTESUR NEGATIVE 05/17/2023 1410   Sepsis Labs Recent Labs  Lab 05/17/23 1015 05/18/23 0639  WBC 4.0 4.1   Microbiology Recent Results (from the past 240 hours)  Culture, blood (Routine X 2) w Reflex to ID Panel     Status: None (Preliminary result)   Collection Time: 05/17/23  3:03 PM   Specimen: BLOOD  Result Value Ref Range Status   Specimen Description BLOOD BLOOD LEFT HAND  Final   Special Requests   Final    BOTTLES DRAWN AEROBIC AND ANAEROBIC Blood Culture results may not be optimal due to an inadequate volume of blood received in culture bottles   Culture   Final    NO GROWTH < 12 HOURS Performed at [facility], [address], [place], [place] [address]    Report Status PENDING  Incomplete  SARS Coronavirus 2 by RT PCR (hospital order, performed in [facility] hospital lab) *cepheid single result test* Anterior Nasal Swab     Status: None   Collection Time: 05/17/23  3:28 PM   Specimen: Anterior Nasal Swab  Result Value Ref Range Status   SARS Coronavirus 2 by RT PCR NEGATIVE NEGATIVE Final    Comment: Performed at [facility], [address], [place], [place] [address]  Culture, blood (Routine X 2) w Reflex to ID Panel     Status: None (Preliminary result)   Collection Time: 05/17/23  8:38 PM    Specimen: BLOOD  Result Value Ref Range Status   Specimen Description BLOOD BLOOD LEFT HAND  Final   Special Requests   Final    BOTTLES DRAWN AEROBIC AND ANAEROBIC Blood Culture results may not be optimal due to an inadequate volume of blood received in culture bottles   Culture   Final    NO GROWTH < 12 HOURS Performed at [facility], [address], [place], [place] [address]    Report Status PENDING  Incomplete     Time coordinating discharge: Over 30 minutes  SIGNED:   Elsie JAYSON [place], DO [facility] 05/18/2023, 12:50 PM Pager   If 7PM-7AM, please contact night-coverage [email or web address]

## 2023-05-18 NOTE — Evaluation (Signed)
 Clinical/Bedside Swallow Evaluation Patient Details  Name: Kimberly [name] MRN: [id number] Date of Birth: 1927-01-10  Today's Date: 05/18/2023 Time: SLP Start Time (ACUTE ONLY): 1003 SLP Stop Time (ACUTE ONLY): 1030 SLP Time Calculation (min) (ACUTE ONLY): 27 min  Past Medical History:  Past Medical History:  Diagnosis Date   Acute blood loss anemia 10/11/2012   Acute diverticulitis 08/24/2013   Acute on chronic combined systolic and diastolic CHF, NYHA class 4 (HCC) 11/15/2013   Antral ulcer 10/11/2012   Aortic atherosclerosis (HCC)    Atrial fibrillation (HCC)    Bipolar affective disorder (HCC) 03/23/2017   Cardiomyopathy, nonischemic (HCC)    Chronic anticoagulation 10/12/2012   CKD (chronic kidney disease) stage 3, GFR 30-59 ml/min (HCC) 10/12/2012   Depression    Diverticulosis    DVT (deep venous thrombosis) (HCC)    left peroneal veins/IVC Filter placed as per hospital discharge 10/24/21   Erosive esophagitis 10/11/2012   Esophagitis    Fibromyalgia    Glaucoma    H/O echocardiogram 2007   EF 40-45%,          Hiatal hernia    History of anemia due to CKD    Hypertension    Internal hemorrhoids    Nephrolithiasis    Osteoarthritis    Pacemaker    Last saw cards 07/2013   Prepyloric ulcer    Scoliosis    Tubular adenoma of colon    Past Surgical History:  Past Surgical History:  Procedure Laterality Date   ABDOMINAL HYSTERECTOMY     APPENDECTOMY     BACK SURGERY     BIOPSY  03/03/2017   Procedure: BIOPSY;  Surgeon: Harvey Margo LITTIE, MD;  Location: [facility];  Service: Endoscopy;;  gastric   BREAST SURGERY     CARDIAC CATHETERIZATION  12/08/2005   LAD AND LEFT MAIN WITH NO HIGH-GRADE STENOSIS. MILD DISEASE IN THE CX AND LAD SYSTEM. SEVERE LV DYSFUNCTION WITH DILATION OF THE LV. EF 15-20%. LV END-DIASTOLIC PRESSURE IS 90. +1 MR.   CHOLECYSTECTOMY     COLONOSCOPY N/A 03/03/2017   Dr. Harvey: 5 colon polyps removed, adenomatous.  Diverticulosis.  99% of the colon  was cleared but the cecum was not adequately seen.   CYSTOSCOPY N/A 02/24/2013   Procedure: CYSTOSCOPY WITH URETHRAL DILITATION;  Surgeon: Mohammad I Javaid, MD;  Location: [facility] ORS;  Service: Urology;  Laterality: N/A;   DOPPLER ECHOCARDIOGRAPHY N/A 05/30/2010   LV SIZE IS NORMAL. LV SYSTOLIC FUNCTION IS LOW NORMAL. EF=50-55%. MILD INFERIOR HYPOKINESIS.MILD TO MODERATE POSTERIOR WALL HYPOKINESIS.PACEMAKER LEAD IN THE RV. LA IS MILDLY DILATED. RA IS MODERATE TO SEVERLY DILATED. PACEMAKER LEAD IN THE RA. MILD CALCICICATION OF THE MV APPARATUS. MODERATE MR. MILD TO MODERATE TR. MILD PHTN.AV MILDLY SCLEROTIC.   ESOPHAGOGASTRODUODENOSCOPY N/A 10/13/2012   Dr. Shaaron: severe ulcerative reflux esophagitis, question of Barrett's but negative path, single deep prepyloric antral ulcer, negative H.pylori   ESOPHAGOGASTRODUODENOSCOPY N/A 03/03/2017   Dr. Harvey: Gastritis/duodenitis, no H. pylori   ESOPHAGOGASTRODUODENOSCOPY (EGD) WITH PROPOFOL  N/A 10/01/2021   Procedure: ESOPHAGOGASTRODUODENOSCOPY (EGD) WITH PROPOFOL ;  Surgeon: Albertus Gordy HERO, MD;  Location: [facility] ENDOSCOPY;  Service: Gastroenterology;  Laterality: N/A;   HERNIA REPAIR     right inguinal hernia and umbilical   LOWETR EXT VENOUS Bilateral 11-08-10   R & L- NO EVIDENCE OF THROMBUS OR THROMBOPHLEBITIS. THERE IS MILD AMOUNT OF SUBCUTANEOUS EDEMA NOTED WITHIN THE LEFT CALF AND ANKLE. R & L GSV AND SSV- NO VENOUS INSUFF NOTED.  NECK SURGERY     NUCLEAR STRESS TEST N/A 02/13/2009   NORMAL PATTERN OF PERFUSION IN ALL REGIONS. POST STRESS VENTICULAR SIZE IS NORMAL. POST  STESS EF 85%.  NORMAL MYOCARDIAL PERFUSION STUDY.   OPEN REDUCTION INTERNAL FIXATION (ORIF) DISTAL PHALANX Left 11/16/2018   Procedure: MIDDLE FINGER OPEN REDUCTION VERSUS RECONSTRUCTION;  Surgeon: Camella Fallow, MD;  Location: [facility] OR;  Service: Orthopedics;  Laterality: Left;   PACEMAKER INSERTION     POLYPECTOMY  03/03/2017   Procedure: POLYPECTOMY;  Surgeon: Harvey Margo CROME, MD;   Location: [facility];  Service: Endoscopy;;  colon   PPM GENERATOR CHANGEOUT N/A 06/18/2020   Procedure: PPM GENERATOR CHANGEOUT;  Surgeon: Francyne Headland, MD;  Location: [facility] INVASIVE CV LAB;  Service: Cardiovascular;  Laterality: N/A;   TONSILLECTOMY     YAG LASER APPLICATION Bilateral 11/20/2014   Procedure: YAG LASER APPLICATION;  Surgeon: Dow JULIANNA Burke, MD;  Location: [facility] ORS;  Service: Ophthalmology;  Laterality: Bilateral;   HPI:  ELIDE STALZER is a 89 yo female presenting to ED 1/12 after being found unresponsive and hypoxic by SNF staff. Admitted 12/29-1/7 for decompensated CHF. CXR shows bilateral pleural effusions and associated airspace disease that likely represents atelectasis. EGD 10/01/21 concerning for short-segment Barrett's esophagus. There was also evidence of a 5 cm hiatal hernia and LA Grade B esophagitis without bleeding at the GE junction. PMH includes CHF, A-fib on Eliquis , HTN, bipolar disorder, CKD 3, CHD s/p PPM    Assessment / Plan / Recommendation  Clinical Impression  Pt reports frequent coughing with liquids. She endorses difficulty swallowing pills with consistent globus sensation. Oral motor exam significant for missing upper dentition with pt stating wearing upper dentures causes gagging. Her cough is weak and suspected to be ineffective as it lacks crispness. Trials of thin liquids via cup caused immediate and prolonged coughing. She was unable to perform throat clearance given cueing. Multiple swallows were observed throughout all trials of thin liquids, purees, and solids. Pt states that she would like to be able to eat/drink without the fear and frustration from incessant coughing even if that results in a need for diet modification. Recommend she remain NPO except meds given in puree pending results on MBS. Will continue to f/u. SLP Visit Diagnosis: Dysphagia, unspecified (R13.10)    Aspiration Risk  Moderate aspiration risk    Diet Recommendation NPO except  meds    Medication Administration: Whole meds with puree    Other  Recommendations Oral Care Recommendations: Oral care QID    Recommendations for follow up therapy are one component of a multi-disciplinary discharge planning process, led by the attending physician.  Recommendations may be updated based on patient status, additional functional criteria and insurance authorization.  Follow up Recommendations Skilled nursing-short term rehab (<3 hours/day)      Assistance Recommended at Discharge    Functional Status Assessment Patient has had a recent decline in their functional status and demonstrates the ability to make significant improvements in function in a reasonable and predictable amount of time.  Frequency and Duration min 2x/week  2 weeks       Prognosis Prognosis for improved oropharyngeal function: Fair Barriers to Reach Goals: Time post onset      Swallow Study   General HPI: FILIPPA YARBOUGH is a 89 yo female presenting to ED 1/12 after being found unresponsive and hypoxic by SNF staff. Admitted 12/29-1/7 for decompensated CHF. CXR shows bilateral pleural effusions and associated airspace disease that likely represents  atelectasis. EGD 10/01/21 concerning for short-segment Barrett's esophagus. There was also evidence of a 5 cm hiatal hernia and LA Grade B esophagitis without bleeding at the GE junction. PMH includes CHF, A-fib on Eliquis , HTN, bipolar disorder, CKD 3, CHD s/p PPM Type of Study: Bedside Swallow Evaluation Previous Swallow Assessment: none in chart Diet Prior to this Study: NPO Temperature Spikes Noted: No Respiratory Status: Room air History of Recent Intubation: No Behavior/Cognition: Alert;Cooperative;Pleasant mood Oral Cavity Assessment: Within Functional Limits Oral Care Completed by SLP: No Oral Cavity - Dentition: Missing dentition (missing upper dentition, unable to comfortably wear upper dentures) Vision: Functional for self-feeding Self-Feeding  Abilities: Needs assist Patient Positioning: Upright in bed Baseline Vocal Quality: Normal Volitional Cough: Weak Volitional Swallow: Able to elicit    Oral/Motor/Sensory Function Overall Oral Motor/Sensory Function: Within functional limits   Ice Chips Ice chips: Not tested   Thin Liquid Thin Liquid: Impaired Presentation: Spoon;Cup Pharyngeal  Phase Impairments: Multiple swallows;Cough - Immediate    Nectar Thick Nectar Thick Liquid: Not tested   Honey Thick Honey Thick Liquid: Not tested   Puree Puree: Impaired Presentation: Spoon;Self Fed Pharyngeal Phase Impairments: Multiple swallows   Solid     Solid: Impaired Presentation: Self Fed Pharyngeal Phase Impairments: Multiple swallows      Damien Blumenthal, M.A., CF-SLP Speech Language Pathology, Acute Rehabilitation Services  Secure Chat preferred [phone]  05/18/2023,11:04 AM

## 2023-05-18 NOTE — TOC Initial Note (Addendum)
 Transition of Care [facility]) - Initial/Assessment Note    Patient Details  Name: Kimberly [name] MRN: [id number] Date of Birth: [birth year]  Transition of Care [facility]) CM/SW Contact:    Waddell Barnie Rama, RN Phone Number: 05/18/2023, 12:23 PM  Clinical Narrative:                 From [facility], has PCP and insurance on file, she is active with [facility] for HHRN,HHPT, has wheelchair at [place].  States family member will transport them home at [employer] and family is support system, states gets medications from [facility]. SABRA  Pta self ambulatory.  NCM confirmed with Channing with [facility] that she is active with RN and PT.  Channing called this NCM back and states that actually she is not active for [facility] , she had a recommendation for hospice at [facility]. NCM contaced [name] the daughter and she states yes she would like hospice if this is what the doctor thinks is best,  NCM informed doctor of this information. He states he thinks hospice is very reasonable and would be his recommendation. NCM contacted Channing with [facility] back and informed her that it will be hospice. NCM notified CSW Janee.   Expected Discharge Plan: Assisted Living Barriers to Discharge: Continued Medical Work up   Patient Goals and CMS Choice Patient states their goals for this hospitalization and ongoing recovery are:: return to [facility]   Choice offered to / list presented to : NA      Expected Discharge Plan and Services In-house Referral: NA Discharge Planning Services: CM Consult Post Acute Care Choice: NA Living arrangements for the past 2 months: Assisted Living Facility                 DME Arranged: N/A DME Agency: NA       HH Arranged: NA          Prior Living Arrangements/Services Living arrangements for the past 2 months: Assisted Living Facility Lives with:: Facility Resident Patient language and need for interpreter reviewed:: Yes Do you feel safe going back to the place where you live?: Yes       Need for Family Participation in Patient Care: Yes (Comment) Care giver support system in place?: Yes (comment) Current home services: DME (wheelchair) Criminal Activity/Legal Involvement Pertinent to Current Situation/Hospitalization: No - Comment as needed  Activities of Daily Living   ADL Screening (condition at time of admission) Independently performs ADLs?: No Does the patient have a NEW difficulty with bathing/dressing/toileting/self-feeding that is expected to last >3 days?: No Does the patient have a NEW difficulty with getting in/out of bed, walking, or climbing stairs that is expected to last >3 days?: No Does the patient have a NEW difficulty with communication that is expected to last >3 days?: No Is the patient deaf or have difficulty hearing?: Yes Does the patient have difficulty seeing, even when wearing glasses/contacts?: Yes Does the patient have difficulty concentrating, remembering, or making decisions?: Yes  Permission Sought/Granted Permission sought to share information with : Case Manager Permission granted to share information with : Yes, Verbal Permission Granted        Permission granted to share info w Relationship: [name] Molt daughter     Emotional Assessment Appearance:: Appears stated age Attitude/Demeanor/Rapport: Engaged Affect (typically observed): Appropriate Orientation: : Oriented to Self, Oriented to Place, Oriented to  Time, Oriented to Situation Alcohol  / Substance Use: Not Applicable Psych Involvement: No (comment)  Admission diagnosis:  Hyponatremia [E87.1] Hypothermia [T68.XXXA]  Hypothermia, initial encounter [T68.XXXA] Congestive heart failure, unspecified HF chronicity, unspecified heart failure type [facility]) [I50.9] Patient Active Problem List   Diagnosis Date Noted   Hypothermia 05/17/2023   Acute encephalopathy 05/17/2023   Elevated TSH 05/12/2023   Normocytic anemia 05/12/2023   Acute heart failure with preserved ejection  fraction (HFpEF) (HCC) 05/05/2023   DVT (deep venous thrombosis) (HCC) 10/24/2021   Hyponatremia 10/23/2021   UTI (urinary tract infection) 10/20/2021   Acute esophagitis    Occult GI bleeding 09/30/2021   Alternating constipation and diarrhea 07/26/2020   Gastroesophageal reflux disease 07/26/2020   Dark stools 07/26/2020   Pacemaker battery depletion    Dyspnea 06/17/2020   Finger dislocation, initial encounter 11/16/2018   Hand trauma, left, initial encounter 11/16/2018   Acute blood loss anemia 10/25/2018   Rectal bleeding 10/23/2018   Acute GI bleeding    Enteritis of small intestine due to enterotoxigenic Escherichia coli associated with diarrhea 03/24/2017   Bipolar affective disorder (HCC) 03/23/2017   Olfactory hallucinations 03/23/2017   Lower GI bleed    Glaucoma 02/28/2017   Pacemaker 03/21/2015   PUD (peptic ulcer disease) 06/14/2014   Hypokalemia 05/26/2014   Fall at home 05/26/2014   HFpEF (heart failure with preverved EF) 11/15/2013   Inability to ambulate due to ankle or foot 09/05/2013   Constipation 08/26/2013   Acute diverticulitis 08/24/2013   CKD (chronic kidney disease) stage 3, GFR 30-59 ml/min (HCC) 10/12/2012   Chronic anticoagulation 10/12/2012   Osteoarthritis    Permanent atrial fibrillation/s/p PPM    Chronic diastolic CHF (congestive heart failure) (HCC)    Fibromyalgia    Essential hypertension    Depression    PCP:  Renato Dorothey HERO, NP Pharmacy:   [facility] - [place], [place] - [address] [place] [address] Phone: [phone] Fax: [phone]     Social Drivers of Health (SDOH) Social History: SDOH Screenings   Food Insecurity: No Food Insecurity (05/17/2023)  Housing: Low Risk  (05/17/2023)  Transportation Needs: No Transportation Needs (05/17/2023)  Utilities: Not At Risk (05/17/2023)  Alcohol  Screen: Low Risk  (10/22/2018)  Depression (PHQ2-9): Medium Risk (12/13/2018)   Financial Resource Strain: Low Risk  (01/06/2023)   Received from [facility]  Physical Activity: Inactive (08/15/2019)  Social Connections: Socially Isolated (05/17/2023)  Stress: No Stress Concern Present (10/22/2018)  Tobacco Use: Medium Risk (05/17/2023)  Health Literacy: Medium Risk (01/06/2023)   Received from [facility]   SDOH Interventions:     Readmission Risk Interventions    05/09/2023    3:46 PM  Readmission Risk Prevention Plan  Transportation Screening Complete  PCP or Specialist Appt within 5-7 Days Complete  Home Care Screening Complete  Medication Review (RN CM) Complete

## 2023-05-18 NOTE — Progress Notes (Signed)
 Modified Barium Swallow Study  Patient Details  Name: Kimberly [name] MRN: [id number] Date of Birth: 1927/04/12  Today's Date: 05/18/2023  Modified Barium Swallow completed.  Full report located under Chart Review in the Imaging Section.  History of Present Illness Kimberly [name] is a 89 yo female presenting to ED 1/12 after being found unresponsive and hypoxic by SNF staff. Admitted 12/29-1/7 for decompensated CHF. CXR shows bilateral pleural effusions and associated airspace disease that likely represents atelectasis. EGD 10/01/21 concerning for short-segment Barrett's esophagus. There was also evidence of a 5 cm hiatal hernia and LA Grade B esophagitis without bleeding at the GE junction. PMH includes CHF, A-fib on Eliquis , HTN, bipolar disorder, CKD 3, CHD s/p PPM   Clinical Impression Pt presents with a moderate oropharyngeal dysphagia primarily characterized by impaired timing and sensation. Her presentation is inconsistent which leads to silent aspiration of thin, nectar thick, and honey thick liquids despite attempts to control quantity and timing. She is intermittently sensate when aspirated quantity is gross and initiates an ineffective cough. Cueing and modeling for a stronger cough, throat clearance, and use of a chin tuck posture is also ineffective. Given pt's inconsistent presentation with overall ineffective use of compensatory strategies, recommend diet of Dys 3 solids and small, controlled sips of thin liquids via cup or tspn. Using thickened liquids for prolonged periods of time has the potential to lead to inadequate hydration and in the case of probable aspiration, are more harmful to respiratory status. Discussed extensively with pt who demonstrates understanding of results and endorses preference to continue consuming thin liquids with aspiration precuations in place. SLP will follow briefly to reinforce recommendations and provide education. Factors that may increase risk of adverse  event in presence of aspiration Noe & Lianne 2021): Poor general health and/or compromised immunity;Reduced cognitive function;Limited mobility;Weak cough;Aspiration of thick, dense, and/or acidic materials;Frequent aspiration of large volumes  Swallow Evaluation Recommendations Recommendations: PO diet PO Diet Recommendation: Dysphagia 3 (Mechanical soft);Thin liquids (Level 0) Liquid Administration via: Cup;Spoon Medication Administration: Whole meds with puree Supervision: Patient able to self-feed;Full supervision/cueing for swallowing strategies Swallowing strategies  : Minimize environmental distractions;Slow rate;Small bites/sips Postural changes: Position pt fully upright for meals;Stay upright 30-60 min after meals Oral care recommendations: Oral care QID (4x/day);Oral care before PO Recommended consults: Consider Palliative care      Damien Blumenthal, M.A., CF-SLP Speech Language Pathology, Acute Rehabilitation Services  Secure Chat preferred [phone]  05/18/2023,1:47 PM

## 2023-05-18 NOTE — ED Notes (Signed)
 Patient able to swallow apple sauce and take medications with safely.

## 2023-05-18 NOTE — Care Management Obs Status (Addendum)
 MEDICARE OBSERVATION STATUS NOTIFICATION   Patient Details  Name: Kimberly [name] MRN: [id number] Date of Birth: 24-Jul-1926   Medicare Observation Status Notification Given:  Yes NCM also went over with [name] Waddell Barnie Reggy, RN 05/18/2023, 11:51 AM

## 2023-05-18 NOTE — TOC Transition Note (Signed)
 Transition of Care [facility]) - Discharge Note   Patient Details  Name: Kimberly [name] MRN: [id number] Date of Birth: 06/01/1926  Transition [facility] Care [facility]) CM/SW Contact:  Luise JAYSON Pan, LCSWA Phone Number: 05/18/2023, 3:04 PM   Clinical Narrative:   Patient will DC to: [facility] (ALF)  Anticipated DC date: 05/18/2023 Family notified: [name] (dtr)  Transport by: [place]   Per MD patient ready for DC to [facility] (ALF). RN to call report prior to discharge [phone]). RN, patient, patient's family, and facility notified of DC. Discharge Summary and FL2 sent to facility. DC packet on chart. Ambulance transport requested for patient.   CSW will sign off for now as social work intervention is no longer needed. Please consult us  again if new needs arise.      Final next level of care: Skilled Nursing Facility Barriers to Discharge: Barriers Resolved   Patient Goals and CMS Choice Patient states their goals for this hospitalization and ongoing recovery are:: return to [facility]   Choice offered to / list presented to : NA      Discharge Placement              Patient chooses bed at: Other - please specify in the comment section below: ([facility]) Patient to be transferred to facility by: [facility]tar Name of family member notified: [name] (dtr) Patient and family notified of of transfer: 05/18/23  Discharge Plan and Services Additional resources added to the After Visit Summary for   In-house Referral: NA Discharge Planning Services: CM Consult Post Acute Care Choice: NA          DME Arranged: N/A DME Agency: NA       HH Arranged: NA          Social Drivers of Health (SDOH) Interventions SDOH Screenings   Food Insecurity: No Food Insecurity (05/17/2023)  Housing: Low Risk  (05/17/2023)  Transportation Needs: No Transportation Needs (05/17/2023)  Utilities: Not At Risk (05/17/2023)  Alcohol  Screen: Low Risk  (10/22/2018)  Depression  (PHQ2-9): Medium Risk (12/13/2018)  Financial Resource Strain: Low Risk  (01/06/2023)   Received from [facility]  Physical Activity: Inactive (08/15/2019)  Social Connections: Socially Isolated (05/17/2023)  Stress: No Stress Concern Present (10/22/2018)  Tobacco Use: Medium Risk (05/17/2023)  Health Literacy: Medium Risk (01/06/2023)   Received from [facility]     Readmission Risk Interventions    05/09/2023    3:46 PM  Readmission Risk Prevention Plan  Transportation Screening Complete  PCP or Specialist Appt within 5-7 Days Complete  Home Care Screening Complete  Medication Review (RN CM) Complete

## 2023-05-18 NOTE — [place] FL2 (Signed)
 [place]  MEDICAID FL2 LEVEL OF CARE FORM     IDENTIFICATION  Patient Name: Kimberly [name] Birthdate: 01-05-1927 Sex: female Admission Date (Current Location): 05/17/2023  [facility] and [place] Number:  Producer, Television/film/video and Address:  [facility] [place]. [facility], [address] [place], [place] [address]      Provider Number: [id number]  Attending Physician Name and Address:  Lue Elsie BROCKS, MD  Relative Name and Phone Number:       Current Level of Care: Hospital Recommended Level of Care: Assisted Living Facility Prior Approval Number:    Date Approved/Denied:   PASRR Number:    Discharge Plan: Other (Comment) (ALF)    Current Diagnoses: Patient Active Problem List   Diagnosis Date Noted   Hypothermia 05/17/2023   Acute encephalopathy 05/17/2023   Elevated TSH 05/12/2023   Normocytic anemia 05/12/2023   Acute heart failure with preserved ejection fraction (HFpEF) (HCC) 05/05/2023   DVT (deep venous thrombosis) (HCC) 10/24/2021   Hyponatremia 10/23/2021   UTI (urinary tract infection) 10/20/2021   Acute esophagitis    Occult GI bleeding 09/30/2021   Alternating constipation and diarrhea 07/26/2020   Gastroesophageal reflux disease 07/26/2020   Dark stools 07/26/2020   Pacemaker battery depletion    Dyspnea 06/17/2020   Finger dislocation, initial encounter 11/16/2018   Hand trauma, left, initial encounter 11/16/2018   Acute blood loss anemia 10/25/2018   Rectal bleeding 10/23/2018   Acute GI bleeding    Enteritis of small intestine due to enterotoxigenic Escherichia coli associated with diarrhea 03/24/2017   Bipolar affective disorder (HCC) 03/23/2017   Olfactory hallucinations 03/23/2017   Lower GI bleed    Glaucoma 02/28/2017   Pacemaker 03/21/2015   PUD (peptic ulcer disease) 06/14/2014   Hypokalemia 05/26/2014   Fall at home 05/26/2014   HFpEF (heart failure with preverved EF) 11/15/2013   Inability to ambulate due to ankle or foot  09/05/2013   Constipation 08/26/2013   Acute diverticulitis 08/24/2013   CKD (chronic kidney disease) stage 3, GFR 30-59 ml/min (HCC) 10/12/2012   Chronic anticoagulation 10/12/2012   Osteoarthritis    Permanent atrial fibrillation/s/p PPM    Chronic diastolic CHF (congestive heart failure) (HCC)    Fibromyalgia    Essential hypertension    Depression     Orientation RESPIRATION BLADDER Height & Weight     Self, Time, Situation, Place  Normal Incontinent, External catheter Weight: 131 lb 9.8 oz (59.7 kg) Height:  5' 7 (170.2 cm)  BEHAVIORAL SYMPTOMS/MOOD NEUROLOGICAL BOWEL NUTRITION STATUS      Continent Diet (As tolerated, regular diet)  AMBULATORY STATUS COMMUNICATION OF NEEDS Skin   Limited Assist Verbally Normal                       Personal Care Assistance Level of Assistance  Bathing, Feeding, Dressing Bathing Assistance: Limited assistance Feeding assistance: Limited assistance Dressing Assistance: Limited assistance     Functional Limitations Info  Sight, Hearing, Speech Sight Info: Impaired (eyeglasses) Hearing Info: Adequate Speech Info: Impaired (Dentures-top/bottom)    SPECIAL CARE FACTORS FREQUENCY  PT (By licensed PT), OT (By licensed OT)     PT Frequency: 5x week OT Frequency: 5x week            Contractures Contractures Info: Not present    Additional Factors Info  Code Status, Allergies Code Status Info: DNR Allergies Info: Penicillins, Ceftriaxone , Camphor, Ciprofloxacin , Flagyl  (Metronidazole ), Papaya Derivatives, Iodine, Sulfa Antibiotics  Discharge Medications:  TAKE these medications     apixaban  2.5 MG Tabs tablet Commonly known as: ELIQUIS  Take 1 tablet (2.5 mg total) by mouth 2 (two) times daily.    carvedilol  6.25 MG tablet Commonly known as: COREG  TAKE 1 TABLET BY MOUTH TWICE  DAILY WITH MEALS    cyanocobalamin  1000 MCG tablet Take 1 tablet (1,000 mcg total) by mouth daily.    furosemide  20 MG  tablet Commonly known as: LASIX  Take 1 tablet (20 mg total) by mouth daily as needed for edema (for worsening leg swelling or shortness of breath). What changed: when to take this    Melatonin 3 MG Caps Take 3 mg by mouth at bedtime.    mirtazapine  15 MG tablet Commonly known as: REMERON  Take 15 mg by mouth at bedtime.    omeprazole  20 MG capsule Commonly known as: PRILOSEC  Take 1 capsule (20 mg total) by mouth daily. What changed: when to take this    potassium chloride  10 MEQ tablet Commonly known as: KLOR-CON  M Take 1 tablet (10 mEq total) by mouth daily as needed (take with furosemide ).    PreserVision AREDS 2 Caps Take 1 capsule by mouth every 12 (twelve) hours.    Ocuvite Adult Formula Caps Take 1 capsule by mouth in the morning.    QUEtiapine  25 MG tablet Commonly known as: SEROQUEL  Take 25 mg by mouth 2 (two) times daily.    sertraline  50 MG tablet Commonly known as: ZOLOFT  Take 50 mg by mouth in the morning.           Relevant Imaging Results:  Relevant Lab Results:   Additional Information SS#[id number]  [name] C [name], LCSWA

## 2023-05-18 NOTE — Progress Notes (Signed)
 Heart Failure Navigator Progress Note  Assessed for Heart & Vascular TOC clinic readiness.  Patient does not meet criteria due to EF 50-55%, per MD note will follow up with her PCP in 1-2 weeks. No HF TOC. .   Navigator will sign off at this time.    Stephane Haddock, BSN, Scientist, Clinical (histocompatibility And Immunogenetics) Only

## 2023-05-18 NOTE — Progress Notes (Signed)
 Patient refusing bair hugger and warm blankets at this time

## 2023-05-18 NOTE — Progress Notes (Signed)
 Report called to Mechele Collin, RN. All questions answered.

## 2023-05-22 LAB — CULTURE, BLOOD (ROUTINE X 2)
Culture: NO GROWTH
Culture: NO GROWTH

## 2023-05-26 ENCOUNTER — Encounter (HOSPITAL_COMMUNITY): Payer: Self-pay | Admitting: Internal Medicine

## 2023-05-26 ENCOUNTER — Observation Stay (HOSPITAL_COMMUNITY)
Admission: EM | Admit: 2023-05-26 | Discharge: 2023-05-27 | Disposition: A | Discharge status: Home / Self Care | Attending: Internal Medicine | Admitting: Internal Medicine

## 2023-05-26 ENCOUNTER — Other Ambulatory Visit: Payer: Self-pay

## 2023-05-26 ENCOUNTER — Emergency Department (HOSPITAL_COMMUNITY)

## 2023-05-26 DIAGNOSIS — I482 Chronic atrial fibrillation, unspecified: Secondary | ICD-10-CM | POA: Diagnosis not present

## 2023-05-26 DIAGNOSIS — R072 Precordial pain: Secondary | ICD-10-CM | POA: Diagnosis present

## 2023-05-26 DIAGNOSIS — Z87891 Personal history of nicotine dependence: Secondary | ICD-10-CM | POA: Diagnosis not present

## 2023-05-26 DIAGNOSIS — I5043 Acute on chronic combined systolic (congestive) and diastolic (congestive) heart failure: Secondary | ICD-10-CM | POA: Diagnosis not present

## 2023-05-26 DIAGNOSIS — N1832 Chronic kidney disease, stage 3b: Secondary | ICD-10-CM | POA: Diagnosis not present

## 2023-05-26 DIAGNOSIS — Z950 Presence of cardiac pacemaker: Secondary | ICD-10-CM | POA: Diagnosis not present

## 2023-05-26 DIAGNOSIS — E871 Hypo-osmolality and hyponatremia: Secondary | ICD-10-CM | POA: Diagnosis not present

## 2023-05-26 DIAGNOSIS — N179 Acute kidney failure, unspecified: Secondary | ICD-10-CM | POA: Diagnosis not present

## 2023-05-26 DIAGNOSIS — R0602 Shortness of breath: Secondary | ICD-10-CM | POA: Diagnosis present

## 2023-05-26 DIAGNOSIS — Z1152 Encounter for screening for COVID-19: Secondary | ICD-10-CM | POA: Diagnosis not present

## 2023-05-26 DIAGNOSIS — Z7409 Other reduced mobility: Secondary | ICD-10-CM | POA: Diagnosis not present

## 2023-05-26 DIAGNOSIS — M1990 Unspecified osteoarthritis, unspecified site: Secondary | ICD-10-CM | POA: Diagnosis not present

## 2023-05-26 DIAGNOSIS — Z7901 Long term (current) use of anticoagulants: Secondary | ICD-10-CM | POA: Diagnosis not present

## 2023-05-26 DIAGNOSIS — R059 Cough, unspecified: Secondary | ICD-10-CM | POA: Diagnosis not present

## 2023-05-26 DIAGNOSIS — E875 Hyperkalemia: Principal | ICD-10-CM

## 2023-05-26 DIAGNOSIS — Z86718 Personal history of other venous thrombosis and embolism: Secondary | ICD-10-CM | POA: Insufficient documentation

## 2023-05-26 DIAGNOSIS — I130 Hypertensive heart and chronic kidney disease with heart failure and stage 1 through stage 4 chronic kidney disease, or unspecified chronic kidney disease: Secondary | ICD-10-CM | POA: Diagnosis not present

## 2023-05-26 LAB — RESP PANEL BY RT-PCR (RSV, FLU A&B, COVID)  RVPGX2
Influenza A by PCR: NEGATIVE
Influenza B by PCR: NEGATIVE
Resp Syncytial Virus by PCR: NEGATIVE
SARS Coronavirus 2 by RT PCR: NEGATIVE

## 2023-05-26 LAB — BRAIN NATRIURETIC PEPTIDE: B Natriuretic Peptide: 692 pg/mL — ABNORMAL HIGH (ref 0.0–100.0)

## 2023-05-26 LAB — BASIC METABOLIC PANEL
Anion gap: 13 (ref 5–15)
BUN: 40 mg/dL — ABNORMAL HIGH (ref 8–23)
CO2: 19 mmol/L — ABNORMAL LOW (ref 22–32)
Calcium: 8.8 mg/dL — ABNORMAL LOW (ref 8.9–10.3)
Chloride: 100 mmol/L (ref 98–111)
Creatinine, Ser: 1.63 mg/dL — ABNORMAL HIGH (ref 0.44–1.00)
GFR, Estimated: 29 mL/min — ABNORMAL LOW (ref >60)
Glucose, Bld: 99 mg/dL (ref 70–99)
Potassium: 6 mmol/L — ABNORMAL HIGH (ref 3.5–5.1)
Sodium: 132 mmol/L — ABNORMAL LOW (ref 135–145)

## 2023-05-26 LAB — CBC
HCT: 25.8 % — ABNORMAL LOW (ref 36.0–46.0)
Hemoglobin: 7.9 g/dL — ABNORMAL LOW (ref 12.0–15.0)
MCH: 25 pg — ABNORMAL LOW (ref 26.0–34.0)
MCHC: 30.6 g/dL (ref 30.0–36.0)
MCV: 81.6 fL (ref 80.0–100.0)
Platelets: 174 10*3/uL (ref 150–400)
RBC: 3.16 MIL/uL — ABNORMAL LOW (ref 3.87–5.11)
RDW: 19.4 % — ABNORMAL HIGH (ref 11.5–15.5)
WBC: 6.3 10*3/uL (ref 4.0–10.5)
nRBC: 1.6 % — ABNORMAL HIGH (ref 0.0–0.2)

## 2023-05-26 LAB — MAGNESIUM: Magnesium: 2.3 mg/dL (ref 1.7–2.4)

## 2023-05-26 LAB — HEPATIC FUNCTION PANEL
ALT: 23 U/L (ref 0–44)
AST: 39 U/L (ref 15–41)
Albumin: 3 g/dL — ABNORMAL LOW (ref 3.5–5.0)
Alkaline Phosphatase: 66 U/L (ref 38–126)
Bilirubin, Direct: 0.3 mg/dL — ABNORMAL HIGH (ref 0.0–0.2)
Indirect Bilirubin: 0.5 mg/dL (ref 0.3–0.9)
Total Bilirubin: 0.8 mg/dL (ref 0.0–1.2)
Total Protein: 6.5 g/dL (ref 6.5–8.1)

## 2023-05-26 LAB — CK: Total CK: 54 U/L (ref 38–234)

## 2023-05-26 LAB — PROCALCITONIN: Procalcitonin: 0.12 ng/mL

## 2023-05-26 LAB — TROPONIN I (HIGH SENSITIVITY)
Troponin I (High Sensitivity): 11 ng/L (ref <18)
Troponin I (High Sensitivity): 15 ng/L (ref <18)

## 2023-05-26 LAB — POTASSIUM: Potassium: 6 mmol/L — ABNORMAL HIGH (ref 3.5–5.1)

## 2023-05-26 MED ORDER — ACETAMINOPHEN 325 MG PO TABS: 650.0000 mg | ORAL_TABLET | Freq: Four times a day (QID) | ORAL | Status: DC | PRN

## 2023-05-26 MED ORDER — ACETAMINOPHEN 650 MG RE SUPP: 650.0000 mg | Freq: Four times a day (QID) | RECTAL | Status: DC | PRN

## 2023-05-26 MED ORDER — BISACODYL 5 MG PO TBEC: 5.0000 mg | DELAYED_RELEASE_TABLET | Freq: Every day | ORAL | Status: DC | PRN

## 2023-05-26 MED ORDER — MIRTAZAPINE 15 MG PO TABS
15.0000 mg | ORAL_TABLET | Freq: Every day | ORAL | Status: DC
  Administered 2023-05-26: 15 mg via ORAL
  Filled 2023-05-26: qty 1

## 2023-05-26 MED ORDER — MELATONIN 3 MG PO TABS
3.0000 mg | ORAL_TABLET | Freq: Every day | ORAL | Status: DC
  Administered 2023-05-26: 3 mg via ORAL
  Filled 2023-05-26: qty 1

## 2023-05-26 MED ORDER — SODIUM ZIRCONIUM CYCLOSILICATE 10 G PO PACK
10.0000 g | PACK | Freq: Once | ORAL | Status: AC
Start: 2023-05-26 — End: 2023-05-26
  Administered 2023-05-26: 10 g via ORAL
  Filled 2023-05-26: qty 1

## 2023-05-26 MED ORDER — QUETIAPINE FUMARATE 25 MG PO TABS
25.0000 mg | ORAL_TABLET | Freq: Two times a day (BID) | ORAL | Status: DC
  Administered 2023-05-26 – 2023-05-27 (×3): 25 mg via ORAL
  Filled 2023-05-26 (×3): qty 1

## 2023-05-26 MED ORDER — SODIUM CHLORIDE 0.9 % IV BOLUS
250.0000 mL | Freq: Once | INTRAVENOUS | Status: AC
Start: 2023-05-26 — End: 2023-05-26
  Administered 2023-05-26: 250 mL via INTRAVENOUS

## 2023-05-26 MED ORDER — ENSURE ENLIVE PO LIQD
237.0000 mL | Freq: Two times a day (BID) | ORAL | Status: DC
  Administered 2023-05-27: 237 mL via ORAL

## 2023-05-26 MED ORDER — CARVEDILOL 3.125 MG PO TABS
3.1250 mg | ORAL_TABLET | Freq: Two times a day (BID) | ORAL | Status: DC
  Administered 2023-05-27: 3.125 mg via ORAL
  Filled 2023-05-26 (×2): qty 1

## 2023-05-26 MED ORDER — APIXABAN 2.5 MG PO TABS
2.5000 mg | ORAL_TABLET | Freq: Two times a day (BID) | ORAL | Status: DC
  Administered 2023-05-26 – 2023-05-27 (×3): 2.5 mg via ORAL
  Filled 2023-05-26 (×3): qty 1

## 2023-05-26 MED ORDER — ALBUTEROL SULFATE (2.5 MG/3ML) 0.083% IN NEBU: 2.5000 mg | INHALATION_SOLUTION | Freq: Four times a day (QID) | RESPIRATORY_TRACT | Status: DC | PRN

## 2023-05-26 MED ORDER — PANTOPRAZOLE SODIUM 40 MG PO TBEC
40.0000 mg | DELAYED_RELEASE_TABLET | Freq: Every day | ORAL | Status: DC
  Administered 2023-05-26 – 2023-05-27 (×2): 40 mg via ORAL
  Filled 2023-05-26 (×2): qty 1

## 2023-05-26 MED ORDER — HYDRALAZINE HCL 20 MG/ML IJ SOLN: 10.0000 mg | Freq: Four times a day (QID) | INTRAMUSCULAR | Status: DC | PRN

## 2023-05-26 MED ORDER — SERTRALINE HCL 50 MG PO TABS
50.0000 mg | ORAL_TABLET | Freq: Every morning | ORAL | Status: DC
  Administered 2023-05-26 – 2023-05-27 (×2): 50 mg via ORAL
  Filled 2023-05-26 (×2): qty 1

## 2023-05-26 MED ORDER — POLYETHYLENE GLYCOL 3350 17 G PO PACK: 17.0000 g | PACK | Freq: Every day | ORAL | Status: DC | PRN

## 2023-05-26 NOTE — H&P (Signed)
[facility] History and Physical  Kimberly [name] UEA:[id number] DOB: 1926/12/18 DOA: 05/26/2023 PCP: Rebekah Chesterfield, NP  Presented from: Home Chief Complaint: Chest pain  History of Present Illness: Kimberly [name] is a 89 y.o. female with PMH significant for HTN, CHF, A-fib on Eliquis, CHB s/p PPM, CKD 3, bipolar disorder, fibromyalgia and GERD/esophagitis, osteoarthritis. Patient is a resident at [facility] Patient presented to the ED last night with complaint of 10/10 chest pain, generalized pain, dark urine, dysuria.  Also reported productive cough, shortness of breath. Recently observed overnight 1/12 to 1/13 for hypoxia, hypothermia and was discharged back to ALF to continue previous meds including Lasix as needed and potassium as needed. Patient remembers the 'big' potassium pills and says they were stopped sometime ago.  In the ED, patient was afebrile, heart rate in 60s, blood pressure 145/69, breathing on room air Initial labs showed WBC count normal, hemoglobin low at 7.9, sodium low at 132, potassium elevated to 6, BUN/creatinine elevated to 40/1.63,  Troponin negative x 2.  BNP elevated to 692 EKG with ventricular paced rhythm at 60 bpm, no ST-T changes, QTc 494 ms. Respiratory virus panel unremarkable Similar bilateral small pleural effusions and associated airspace opacities compared to 05/17/2023.  Patient received IV fluid, [facility] service consulted for in-hospital care.    At the time of my evaluation, patient was lying down on ED gurney on the hallway. Alert, awake, oriented x 3 but had difficulty in recalling all the details from history. Lives at an ALF, uses a walker.  Daughter lives up in [place]. Patient remembers the 'big' potassium pills and says they were stopped sometime ago.   Review of Systems:  All systems were reviewed and were negative unless otherwise mentioned in the HPI   Past medical history: Past Medical History:   Diagnosis Date   Acute blood loss anemia 10/11/2012   Acute diverticulitis 08/24/2013   Acute on chronic combined systolic and diastolic CHF, NYHA class 4 (HCC) 11/15/2013   Antral ulcer 10/11/2012   Aortic atherosclerosis (HCC)    Atrial fibrillation (HCC)    Bipolar affective disorder (HCC) 03/23/2017   Cardiomyopathy, nonischemic (HCC)    Chronic anticoagulation 10/12/2012   CKD (chronic kidney disease) stage 3, GFR 30-59 ml/min (HCC) 10/12/2012   Depression    Diverticulosis    DVT (deep venous thrombosis) (HCC)    left peroneal veins/IVC Filter placed as per hospital discharge 10/24/21   Erosive esophagitis 10/11/2012   Esophagitis    Fibromyalgia    Glaucoma    H/O echocardiogram 2007   EF 40-45%,          Hiatal hernia    History of anemia due to CKD    Hypertension    Internal hemorrhoids    Nephrolithiasis    Osteoarthritis    Pacemaker    Last saw cards 07/2013   Prepyloric ulcer    Scoliosis    Tubular adenoma of colon     Past surgical history: Past Surgical History:  Procedure Laterality Date   ABDOMINAL HYSTERECTOMY     APPENDECTOMY     BACK SURGERY     BIOPSY  03/03/2017   Procedure: BIOPSY;  Surgeon: West Bali, MD;  Location: [facility] ENDO [facility];  Service: Endoscopy;;  gastric   BREAST SURGERY     CARDIAC CATHETERIZATION  12/08/2005   LAD AND LEFT MAIN WITH NO HIGH-GRADE STENOSIS. MILD DISEASE IN THE CX AND LAD SYSTEM. SEVERE LV DYSFUNCTION WITH DILATION OF  THE LV. EF 15-20%. LV END-DIASTOLIC PRESSURE IS 90. +1 MR.   CHOLECYSTECTOMY     COLONOSCOPY N/A 03/03/2017   Dr. Darrick Penna: 5 colon polyps removed, adenomatous.  Diverticulosis.  99% of the colon was cleared but the cecum was not adequately seen.   CYSTOSCOPY N/A 02/24/2013   Procedure: CYSTOSCOPY WITH URETHRAL DILITATION;  Surgeon: Ky Barban, MD;  Location: [facility] ORS;  Service: Urology;  Laterality: N/A;   DOPPLER ECHOCARDIOGRAPHY N/A 05/30/2010   LV SIZE IS NORMAL. LV SYSTOLIC FUNCTION IS LOW  NORMAL. EF=50-55%. MILD INFERIOR HYPOKINESIS.MILD TO MODERATE POSTERIOR WALL HYPOKINESIS.PACEMAKER LEAD IN THE RV. LA IS MILDLY DILATED. RA IS MODERATE TO SEVERLY DILATED. PACEMAKER LEAD IN THE RA. MILD CALCICICATION OF THE MV APPARATUS. MODERATE MR. MILD TO MODERATE TR. MILD PHTN.AV MILDLY SCLEROTIC.   ESOPHAGOGASTRODUODENOSCOPY N/A 10/13/2012   Dr. Jena Gauss: severe ulcerative reflux esophagitis, question of Barrett's but negative path, single deep prepyloric antral ulcer, negative H.pylori   ESOPHAGOGASTRODUODENOSCOPY N/A 03/03/2017   Dr. Darrick Penna: Gastritis/duodenitis, no H. pylori   ESOPHAGOGASTRODUODENOSCOPY (EGD) WITH PROPOFOL N/A 10/01/2021   Procedure: ESOPHAGOGASTRODUODENOSCOPY (EGD) WITH PROPOFOL;  Surgeon: Beverley Fiedler, MD;  Location: [facility];  Service: Gastroenterology;  Laterality: N/A;   HERNIA REPAIR     right inguinal hernia and umbilical   LOWETR EXT VENOUS Bilateral 11-08-10   R & L- NO EVIDENCE OF THROMBUS OR THROMBOPHLEBITIS. THERE IS MILD AMOUNT OF SUBCUTANEOUS EDEMA NOTED WITHIN THE LEFT CALF AND ANKLE. R & L GSV AND SSV- NO VENOUS INSUFF NOTED.   NECK SURGERY     NUCLEAR STRESS TEST N/A 02/13/2009   NORMAL PATTERN OF PERFUSION IN ALL REGIONS. POST STRESS VENTICULAR SIZE IS NORMAL. POST  STESS EF 85%.  NORMAL MYOCARDIAL PERFUSION STUDY.   OPEN REDUCTION INTERNAL FIXATION (ORIF) DISTAL PHALANX Left 11/16/2018   Procedure: MIDDLE FINGER OPEN REDUCTION VERSUS RECONSTRUCTION;  Surgeon: Dominica Severin, MD;  Location: [facility] OR;  Service: Orthopedics;  Laterality: Left;   PACEMAKER INSERTION     POLYPECTOMY  03/03/2017   Procedure: POLYPECTOMY;  Surgeon: West Bali, MD;  Location: [facility];  Service: Endoscopy;;  colon   PPM GENERATOR CHANGEOUT N/A 06/18/2020   Procedure: PPM GENERATOR CHANGEOUT;  Surgeon: Thurmon Fair, MD;  Location: [facility] INVASIVE CV LAB;  Service: Cardiovascular;  Laterality: N/A;   TONSILLECTOMY     YAG LASER APPLICATION Bilateral 11/20/2014   Procedure:  YAG LASER APPLICATION;  Surgeon: Susa Simmonds, MD;  Location: [facility] ORS;  Service: Ophthalmology;  Laterality: Bilateral;    Social History:  reports that she quit smoking about 18 years ago. Her smoking use included cigarettes. She started smoking about 48 years ago. She has a 15 pack-year smoking history. She has never used smokeless tobacco. She reports that she does not currently use alcohol. She reports that she does not use drugs.  Allergies:  Allergies  Allergen Reactions   Penicillins Hives    DID THE REACTION INVOLVE: Swelling of the face/tongue/throat, SOB, or low BP? Unknown  Sudden or severe rash/hives, skin peeling, or the inside of the mouth or nose? Yes  Did it require medical treatment? Yes  When did it last happen?    73 or 89 years old    If all above answers are "NO", may proceed with cephalosporin use.  DID THE REACTION INVOLVE: Swelling of the face/tongue/throat, SOB, or low BP? Unknown  Sudden or severe rash/hives, skin peeling, or the inside of the mouth or nose? Yes  Did it require  medical treatment? Yes  When did it last happen?    27 or 89 years old    If all above answers are "NO", may proceed with cephalosporin use.   Ceftriaxone Itching   Camphor Other (See Comments)    "Allergic," per facility   Ciprofloxacin Diarrhea and Other (See Comments)    Possibly caused diarrhea November 2018   Flagyl [Metronidazole] Other (See Comments)    Possibly caused diarrhea November 2018   Papaya Derivatives Hives   Iodine Rash and Other (See Comments)    If injected,  Rash/irritated skin reaction "welts"   Sulfa Antibiotics Rash   Penicillins, Ceftriaxone, Camphor, Ciprofloxacin, Flagyl [metronidazole], Papaya derivatives, Iodine, and Sulfa antibiotics   Family history:  Family History  Problem Relation Age of Onset   Cancer Sister    Asthma Sister    Heart failure Brother    Pulmonary embolism Brother    Cancer Brother    Colon cancer Neg Hx       Physical Exam: Vitals:   05/26/23 0930 05/26/23 0945 05/26/23 1000 05/26/23 1015  BP: 135/65 133/77 125/67 (!) 121/91  Pulse: (!) 59 60 (!) 58 (!) 59  Resp: 16 14 12 16   Temp:      TempSrc:      SpO2: 100% 100% 100% 100%   Wt Readings from Last 3 Encounters:  05/18/23 59.7 kg  05/12/23 59.6 kg  12/11/22 56 kg   There is no height or weight on file to calculate BMI.  General exam: Pleasant, elderly Caucasian female.  Lying down in bed.  Not in distress Skin: No rashes, lesions or ulcers. HEENT: Atraumatic, normocephalic, no obvious bleeding Lungs: Clear to auscultation bilaterally, on low-flow oxygen for comfort.  O2 sat 100% on monitor CVS: S1, S2, no murmur,   GI/Abd: Soft, nontender, nondistended, bowel sound present,   CNS: Alert, awake, slow to respond but oriented x 3 Psychiatry: Mood appropriate,  Extremities: No pedal edema, no calf tenderness, chronic deformities of small interphalangeal joints in both hands, R>L  ----------------------------------------------------------------------------------------------------------------------------------------- ----------------------------------------------------------------------------------------------------------------------------------------- -----------------------------------------------------------------------------------------------------------------------------------------  Assessment/Plan: Principal Problem:   Hyperkalemia  Atypical chest pain H/o GERD/esophagitis Reportedly had 10/10 chest pain prior to presentation EKG unremarkable for ischemic changes. Troponin negative x 2 Likely noncardiac in nature. Patient has history of fibromyalgia and GERD/esophagitis Continue PPI Recent Labs    05/26/23 0408 05/26/23 0514 05/26/23 0545  CKTOTAL  --   --  54  TROPONINIHS 11 15  --    Productive cough No fever, WBC count normal, Chest x-ray with similar small bilateral pleural effusion associated airspace  opacities compared to 05/17/2023 procalcitonin level barely elevated No antibiotics for now Recent Labs  Lab 05/26/23 0408 05/26/23 0545  WBC 6.3  --   PROCALCITON  --  0.12   AKI on CKD 3B Baseline creatinine 1.3 from a week ago.  Presented with creatinine elevated 1.63.  Gentle IV bolus was given in the ED. I would encourage oral hydration and keep diuretics on hold for now Continue to monitor creatinine. Recent Labs    05/04/23 0705 05/05/23 0803 05/06/23 0328 05/07/23 0337 05/08/23 0518 05/09/23 1610 05/11/23 0458 05/17/23 1015 05/18/23 0639 05/26/23 0408  BUN 42* 46* 52* 55* 65* 67* 73* 38* 36* 40*  CREATININE 1.29* 1.21* 1.73* 1.78* 1.44* 1.32* 1.28* 1.09* 1.30* 1.63*   Hyperkalemia Potassium level elevated to 6.  Confirmed on repeat BMP.  Likely due to AKI. Patient states the big potassium pills were stopped sometime ago.  Not on Aldactone  Given 1 dose of Lokelma in the ED Continue to monitor labs Recent Labs  Lab 05/26/23 0408 05/26/23 0545  K 6.0* 6.0*  MG  --  2.3   Chronic hyponatremia Sodium level in the past had been as low as 125.  It is 132 today. Asymptomatic continue to monitor Recent Labs  Lab 05/26/23 0408  NA 132*   H/o CHF HTN Has mild chronic bilateral pedal edema.  Does not look volume overloaded PTA meds- carvedilol 6.25 mg twice daily, Lasix 20 mg daily PRN Given borderline bradycardia and AKI, resume carvedilol at a lower dose of 3.125 mg twice daily.  Keep Lasix on hold. Continue to monitor blood pressure and CHF symptoms  Chronic A-fib  CHB s/p PPM Continue carvedilol Continue chronic anticoagulation with Eliquis  Bipolar disorder Fibromyalgia PTA meds- Seroquel 25 mg twice daily, Zoloft 50 mg in a.m., Remeron 15 mg nightly, melatonin nightly, Resume all  Osteoarthritis Impaired mobility Uses a walker.  Lives in an ALF As needed Tylenol for pain PT eval requested  Goals of care:   Code Status: Limited: Do not attempt  resuscitation (DNR) -DNR-LIMITED -Do Not Intubate/DNI     DVT prophylaxis:  apixaban (ELIQUIS) tablet 2.5 mg Start: 05/26/23 1000 apixaban (ELIQUIS) tablet 2.5 mg   Antimicrobials: None for now Fluid: None Consultants: None Family Communication: None at bedside.  Called and updated her daughter Ms. Clydie Braun who lives in [place].  Dispo: The patient is from: ALF              Anticipated d/c is to: Expecting back to ALF in 1 to 2 days  Diet: Diet Order             Diet Heart Room service appropriate? Yes; Fluid consistency: Thin  Diet effective now                    ------------------------------------------------------------------------------------- Severity of Illness: The appropriate patient status for this patient is OBSERVATION. Observation status is judged to be reasonable and necessary in order to provide the required intensity of service to ensure the patient's safety. The patient's presenting symptoms, physical exam findings, and initial radiographic and laboratory data in the context of their medical condition is felt to place them at decreased risk for further clinical deterioration. Furthermore, it is anticipated that the patient will be medically stable for discharge from the hospital within 2 midnights of admission.  -------------------------------------------------------------------------------------   Home Meds: Prior to Admission medications   Medication Sig Start Date End Date Taking? Authorizing Provider  apixaban (ELIQUIS) 2.5 MG TABS tablet Take 1 tablet (2.5 mg total) by mouth 2 (two) times daily. 09/01/22  Yes Croitoru, Mihai, MD  carvedilol (COREG) 6.25 MG tablet TAKE 1 TABLET BY MOUTH TWICE  DAILY WITH MEALS 12/19/22  Yes Croitoru, Mihai, MD  furosemide (LASIX) 20 MG tablet Take 1 tablet (20 mg total) by mouth daily as needed for edema (for worsening leg swelling or shortness of breath). Patient taking differently: Take 20 mg by mouth daily. 10/24/21  Yes  Arrien, York Ram, MD  Melatonin 3 MG CAPS Take 3 mg by mouth at bedtime.   Yes [provider]  mirtazapine (REMERON) 15 MG tablet Take 15 mg by mouth at bedtime.   Yes [provider]  Multiple Vitamins-Minerals (PRESERVISION AREDS 2) CAPS Take 1 capsule by mouth every 12 (twelve) hours.   Yes [provider]  omeprazole (PRILOSEC) 20 MG capsule Take 1 capsule (20 mg total) by mouth  daily. Patient taking differently: Take 20 mg by mouth daily before breakfast. 12/30/22  Yes Croitoru, Mihai, MD  potassium chloride (KLOR-CON M) 10 MEQ tablet Take 1 tablet (10 mEq total) by mouth daily as needed (take with furosemide). 10/24/21  Yes Arrien, York Ram, MD  QUEtiapine (SEROQUEL) 25 MG tablet Take 25 mg by mouth 2 (two) times daily.   Yes [provider]  sertraline (ZOLOFT) 50 MG tablet Take 50 mg by mouth in the morning.   Yes [provider]    Labs on Admission:   CBC: Recent Labs  Lab 05/26/23 0408  WBC 6.3  HGB 7.9*  HCT 25.8*  MCV 81.6  PLT 174    Basic Metabolic Panel: Recent Labs  Lab 05/26/23 0408 05/26/23 0545  NA 132*  --   K 6.0* 6.0*  CL 100  --   CO2 19*  --   GLUCOSE 99  --   BUN 40*  --   CREATININE 1.63*  --   CALCIUM 8.8*  --   MG  --  2.3    Liver Function Tests: Recent Labs  Lab 05/26/23 0545  AST 39  ALT 23  ALKPHOS 66  BILITOT 0.8  PROT 6.5  ALBUMIN 3.0*   No results for input(s): "LIPASE", "AMYLASE" in the last 168 hours. No results for input(s): "AMMONIA" in the last 168 hours.  Cardiac Enzymes: Recent Labs  Lab 05/26/23 0545  CKTOTAL 54    BNP (last 3 results) Recent Labs    05/09/23 0607 05/17/23 1015 05/26/23 0640  BNP 593.6* 639.1* 692.0*    ProBNP (last 3 results) No results for input(s): "PROBNP" in the last 8760 hours.  CBG: No results for input(s): "GLUCAP" in the last 168 hours.  Lipase     Component Value Date/Time   LIPASE 56 (H) 12/09/2022 2348      Urinalysis    Component Value Date/Time   COLORURINE STRAW (A) 05/17/2023 1410   APPEARANCEUR CLEAR 05/17/2023 1410   LABSPEC 1.005 05/17/2023 1410   PHURINE 7.0 05/17/2023 1410   GLUCOSEU NEGATIVE 05/17/2023 1410   HGBUR NEGATIVE 05/17/2023 1410   BILIRUBINUR NEGATIVE 05/17/2023 1410   BILIRUBINUR neg 04/25/2014 0855   KETONESUR NEGATIVE 05/17/2023 1410   PROTEINUR NEGATIVE 05/17/2023 1410   UROBILINOGEN 0.2 05/26/2014 1415   NITRITE NEGATIVE 05/17/2023 1410   LEUKOCYTESUR NEGATIVE 05/17/2023 1410     Drugs of Abuse     Component Value Date/Time   LABOPIA NONE DETECTED 04/19/2018 0523   COCAINSCRNUR NONE DETECTED 04/19/2018 0523   LABBENZ NONE DETECTED 04/19/2018 0523   AMPHETMU NONE DETECTED 04/19/2018 0523   THCU NONE DETECTED 04/19/2018 0523   LABBARB NONE DETECTED 04/19/2018 0523      Radiological Exams on Admission: DG Chest 2 View Result Date: 05/26/2023 CLINICAL DATA:  Encounter for chest pain or shortness of breath EXAM: CHEST - 2 VIEW COMPARISON:  05/17/2023 FINDINGS: Stable cardiomegaly. Aortic atherosclerotic calcification. Left chest wall ICD. Small bilateral pleural effusions and associated airspace opacities. Chronic interstitial coarsening. No pneumothorax. IMPRESSION: Similar bilateral small pleural effusions and associated airspace opacities compared to 05/17/2023. Cardiomegaly and pulmonary vascular congestion. Electronically Signed   By: Minerva Fester M.D.   On: 05/26/2023 03:43     Signed, Lorin Glass, MD [facility] 05/26/2023

## 2023-05-26 NOTE — ED Provider Notes (Signed)
[place] EMERGENCY DEPARTMENT AT [facility] Provider Note   CSN: [id number] Arrival date & time: 05/26/23  0244     History  Chief Complaint  Patient presents with   Chest Pain    Kimberly [name] is a 89 y.o. female.  The history is provided by the patient.  Chest Pain Kimberly [name] is a 89 y.o. female who presents to the Emergency Department complaining of chest pain.  She presents to the emergency department for evaluation of chest pain with difficulty breathing, cough productive of green sputum.  She also complains of pain all over.  She also complains of low back pain for several months with intermittent dysuria.  No fevers, vomiting, abdominal pain.      Home Medications Prior to Admission medications   Medication Sig Start Date End Date Taking? Authorizing Provider  apixaban (ELIQUIS) 2.5 MG TABS tablet Take 1 tablet (2.5 mg total) by mouth 2 (two) times daily. 09/01/22  Yes Croitoru, Mihai, MD  carvedilol (COREG) 6.25 MG tablet TAKE 1 TABLET BY MOUTH TWICE  DAILY WITH MEALS 12/19/22  Yes Croitoru, Mihai, MD  furosemide (LASIX) 20 MG tablet Take 1 tablet (20 mg total) by mouth daily as needed for edema (for worsening leg swelling or shortness of breath). Patient taking differently: Take 20 mg by mouth daily. 10/24/21  Yes Arrien, York Ram, MD  Melatonin 3 MG CAPS Take 3 mg by mouth at bedtime.   Yes [provider]  mirtazapine (REMERON) 15 MG tablet Take 15 mg by mouth at bedtime.   Yes [provider]  Multiple Vitamins-Minerals (PRESERVISION AREDS 2) CAPS Take 1 capsule by mouth every 12 (twelve) hours.   Yes [provider]  omeprazole (PRILOSEC) 20 MG capsule Take 1 capsule (20 mg total) by mouth daily. Patient taking differently: Take 20 mg by mouth daily before breakfast. 12/30/22  Yes Croitoru, Mihai, MD  potassium chloride (KLOR-CON M) 10 MEQ tablet Take 1 tablet (10 mEq total) by mouth daily as needed (take with furosemide).  10/24/21  Yes Arrien, York Ram, MD  QUEtiapine (SEROQUEL) 25 MG tablet Take 25 mg by mouth 2 (two) times daily.   Yes [provider]  sertraline (ZOLOFT) 50 MG tablet Take 50 mg by mouth in the morning.   Yes [provider]      Allergies    Penicillins, Ceftriaxone, Camphor, Ciprofloxacin, Flagyl [metronidazole], Papaya derivatives, Iodine, and Sulfa antibiotics    Review of Systems   Review of Systems  Cardiovascular:  Positive for chest pain.  All other systems reviewed and are negative.   Physical Exam Updated Vital Signs BP (!) 145/69 (BP Location: Left Arm)   Pulse 62   Temp 97.8 F (36.6 C) (Oral)   Resp 16   SpO2 100%  Physical Exam Vitals and nursing note reviewed.  Constitutional:      Appearance: She is well-developed.  HENT:     Head: Normocephalic and atraumatic.  Cardiovascular:     Rate and Rhythm: Normal rate and regular rhythm.     Heart sounds: No murmur heard. Pulmonary:     Effort: Pulmonary effort is normal. No respiratory distress.     Comments: Decreased air movement in bilateral bases Abdominal:     Palpations: Abdomen is soft.     Tenderness: There is no abdominal tenderness. There is no guarding or rebound.  Musculoskeletal:        General: No tenderness.     Comments: Trace edema  to bilateral lower extremities  Skin:    General: Skin is warm and dry.  Neurological:     Mental Status: She is alert.     Comments: Oriented to person, place, generalized weakness  Psychiatric:        Behavior: Behavior normal.     ED Results / Procedures / Treatments   Labs (all labs ordered are listed, but only abnormal results are displayed) Labs Reviewed  BASIC METABOLIC PANEL - Abnormal; Notable for the following components:      Result Value   Sodium 132 (*)    Potassium 6.0 (*)    CO2 19 (*)    BUN 40 (*)    Creatinine, Ser 1.63 (*)    Calcium 8.8 (*)    GFR, Estimated 29 (*)    All other components within normal  limits  CBC - Abnormal; Notable for the following components:   RBC 3.16 (*)    Hemoglobin 7.9 (*)    HCT 25.8 (*)    MCH 25.0 (*)    RDW 19.4 (*)    nRBC 1.6 (*)    All other components within normal limits  POTASSIUM - Abnormal; Notable for the following components:   Potassium 6.0 (*)    All other components within normal limits  HEPATIC FUNCTION PANEL - Abnormal; Notable for the following components:   Albumin 3.0 (*)    Bilirubin, Direct 0.3 (*)    All other components within normal limits  RESP PANEL BY RT-PCR (RSV, FLU A&B, COVID)  RVPGX2  CK  BRAIN NATRIURETIC PEPTIDE  URINALYSIS, W/ REFLEX TO CULTURE (INFECTION SUSPECTED)  TROPONIN I (HIGH SENSITIVITY)  TROPONIN I (HIGH SENSITIVITY)    EKG EKG Interpretation Date/Time:  Tuesday May 26 2023 03:10:31 EST Ventricular Rate:  60 PR Interval:    QRS Duration:  158 QT Interval:  494 QTC Calculation: 494 R Axis:   -49  Text Interpretation: Ventricular-paced rhythm Abnormal ECG When compared with ECG of 17-May-2023 10:13, PREVIOUS ECG IS PRESENT Confirmed by Tilden Fossa [phone]) on 05/26/2023 5:12:09 AM  Radiology DG Chest 2 View Result Date: 05/26/2023 CLINICAL DATA:  Encounter for chest pain or shortness of breath EXAM: CHEST - 2 VIEW COMPARISON:  05/17/2023 FINDINGS: Stable cardiomegaly. Aortic atherosclerotic calcification. Left chest wall ICD. Small bilateral pleural effusions and associated airspace opacities. Chronic interstitial coarsening. No pneumothorax. IMPRESSION: Similar bilateral small pleural effusions and associated airspace opacities compared to 05/17/2023. Cardiomegaly and pulmonary vascular congestion. Electronically Signed   By: Minerva Fester M.D.   On: 05/26/2023 03:43    Procedures Procedures   CRITICAL CARE Performed by: Tilden Fossa   Total critical care time: 35 minutes  Critical care time was exclusive of separately billable procedures and treating other patients.  Critical care  was necessary to treat or prevent imminent or life-threatening deterioration.  Critical care was time spent personally by me on the following activities: development of treatment plan with patient and/or surrogate as well as nursing, discussions with consultants, evaluation of patient's response to treatment, examination of patient, obtaining history from patient or surrogate, ordering and performing treatments and interventions, ordering and review of laboratory studies, ordering and review of radiographic studies, pulse oximetry and re-evaluation of patient's condition.  Medications Ordered in ED Medications  sodium chloride 0.9 % bolus 250 mL (250 mLs Intravenous New Bag/Given 05/26/23 0652)  sodium zirconium cyclosilicate (LOKELMA) packet 10 g (10 g Oral Given 05/26/23 2130)    ED Course/ Medical Decision Making/ A&P  Medical Decision Making Amount and/or Complexity of Data Reviewed Labs: ordered. Radiology: ordered.  Risk Prescription drug management. Decision regarding hospitalization.   Patient here for evaluation of chest pain, difficulty breathing.  Has a history of A-fib and is on anticoagulation.  Also has CKD, permanent pacemaker.  Overall she is euvolemic on examination.  CBC with stable anemia.  BMP with AKI, hyperkalemia.  EKG with paced rhythm.  She was treated with Lokelma, gentle IV fluid hydration.  Given her AKI with hyperkalemia plan to admit for ongoing care.  Troponins are negative x 2, current clinical picture is not consistent with ACS.  Hospitalist consulted for admission.        Final Clinical Impression(s) / ED Diagnoses Final diagnoses:  Precordial pain  Hyperkalemia  AKI (acute kidney injury) [facility])    Rx / DC Orders ED Discharge Orders     None         Tilden Fossa, MD 05/26/23 [phone]

## 2023-05-26 NOTE — ED Notes (Signed)
 CCMD notified. Pt is on monitor.

## 2023-05-26 NOTE — Progress Notes (Signed)
NEW ADMISSION NOTE New Admission Note:   Arrival Method: Patient arrived from ED Mental Orientation: alert and oriented x 3 Telemetry: 29M 11 Assessment: Completed Skin: ecchymosis bilateral arms, and lower extremities IV: L FA Pain: denies any pain Tubes: N/A Safety Measures: Safety Fall Prevention Plan has been given, discussed and signed Admission: Completed [facility] Orientation: Patient has been orientated to the room, unit and staff.  Family: NOne  Orders have been reviewed and implemented. Will continue to monitor the patient. Call light has been placed within reach and bed alarm has been activated.   Arvilla Meres, RN

## 2023-05-26 NOTE — ED Triage Notes (Signed)
Pt is coming from [facility], she is coming in for chest pain that is a 10/10 as well as generalized pain all over. She does express some mild UTI symptoms such as burning with urination, and dark urine and mentions that has been an on going issue x 3 months.   Medic vitals   100/ palp 50hr 16rr 97%ra

## 2023-05-26 NOTE — Plan of Care (Signed)

## 2023-05-27 DIAGNOSIS — E875 Hyperkalemia: Secondary | ICD-10-CM | POA: Diagnosis not present

## 2023-05-27 LAB — BASIC METABOLIC PANEL
Anion gap: 7 (ref 5–15)
BUN: 37 mg/dL — ABNORMAL HIGH (ref 8–23)
CO2: 25 mmol/L (ref 22–32)
Calcium: 8.8 mg/dL — ABNORMAL LOW (ref 8.9–10.3)
Chloride: 101 mmol/L (ref 98–111)
Creatinine, Ser: 1.41 mg/dL — ABNORMAL HIGH (ref 0.44–1.00)
GFR, Estimated: 35 mL/min — ABNORMAL LOW (ref >60)
Glucose, Bld: 93 mg/dL (ref 70–99)
Potassium: 4.9 mmol/L (ref 3.5–5.1)
Sodium: 133 mmol/L — ABNORMAL LOW (ref 135–145)

## 2023-05-27 LAB — CBC
HCT: 23.7 % — ABNORMAL LOW (ref 36.0–46.0)
Hemoglobin: 7.5 g/dL — ABNORMAL LOW (ref 12.0–15.0)
MCH: 24.8 pg — ABNORMAL LOW (ref 26.0–34.0)
MCHC: 31.6 g/dL (ref 30.0–36.0)
MCV: 78.5 fL — ABNORMAL LOW (ref 80.0–100.0)
Platelets: 163 10*3/uL (ref 150–400)
RBC: 3.02 MIL/uL — ABNORMAL LOW (ref 3.87–5.11)
RDW: 19.2 % — ABNORMAL HIGH (ref 11.5–15.5)
WBC: 5 10*3/uL (ref 4.0–10.5)
nRBC: 2.8 % — ABNORMAL HIGH (ref 0.0–0.2)

## 2023-05-27 MED ORDER — FUROSEMIDE 20 MG PO TABS: 20.0000 mg | ORAL_TABLET | Freq: Every day | ORAL | 0 refills | Status: AC | PRN

## 2023-05-27 MED ORDER — CARVEDILOL 3.125 MG PO TABS: 3.1250 mg | ORAL_TABLET | Freq: Two times a day (BID) | ORAL | 2 refills | Status: AC

## 2023-05-27 NOTE — TOC Transition Note (Addendum)
Transition of Care [facility]) - Discharge Note   Patient Details  Name: Kimberly [name] MRN: [id number] Date of Birth: 03-May-1927  [facility] of [facility]) CM/SW Contact:  Michaela Corner, LCSWA Phone Number: 05/27/2023, 11:39 AM   Clinical Narrative:   Patient will DC to: [facility] (ALF)  Anticipated DC date: 05/27/2023 Family notified: Clydie Braun (dtr)   Transport by: Sharin Mons     Per MD patient ready for DC to [facility] (ALF). RN to call report prior to discharge [phone]). RN, patient, patient's family, and facility notified of DC. Discharge Summary and FL2 sent to facility. DC packet on chart. Ambulance transport requested for patient.    CSW will sign off for now as social work intervention is no longer needed. Please consult [place] again if new needs arise.    Final next level of care: Assisted Living Barriers to Discharge: Barriers Resolved   Patient Goals and CMS Choice Patient states their goals for this hospitalization and ongoing recovery are:: return to [facility] CMS Medicare.gov Compare Post Acute Care list provided to:: Patient Choice offered to / list presented to : Patient [place] ownership interest in [facility].provided to:: Adult Children    Discharge Placement              Patient chooses bed at: Other - please specify in the comment section below: ([facility]) Patient to be transferred to facility by: Ptr Name of family member notified: Clydie Braun (dtr) Patient and family notified of of transfer: 05/27/23  Discharge Plan and Services Additional resources added to the After Visit Summary for   In-house Referral: Clinical Social Work                                   Social Drivers of Health (SDOH) Interventions SDOH Screenings   Food Insecurity: No Food Insecurity (05/26/2023)  Housing: Low Risk  (05/26/2023)  Transportation Needs: No Transportation Needs (05/26/2023)  Utilities: Not At Risk (05/26/2023)   Alcohol Screen: Low Risk  (10/22/2018)  Depression (PHQ2-9): Medium Risk (12/13/2018)  Financial Resource Strain: Low Risk  (01/06/2023)   Received from [facility]  Physical Activity: Inactive (08/15/2019)  Social Connections: Socially Isolated (05/26/2023)  Stress: No Stress Concern Present (10/22/2018)  Tobacco Use: Medium Risk (05/26/2023)  Health Literacy: Medium Risk (01/06/2023)   [facility]     Readmission Risk Interventions    05/09/2023    3:46 PM  Readmission Risk Prevention Plan  Transportation Screening Complete  PCP or Specialist Appt within 5-7 Days Complete  Home Care Screening Complete  Medication Review (RN CM) Complete

## 2023-05-27 NOTE — Plan of Care (Signed)

## 2023-05-27 NOTE — Discharge Summary (Signed)
Physician Discharge Summary  Kimberly [name]:[id number] DOB: 1927-04-11 DOA: 05/26/2023  PCP: Rebekah Chesterfield, NP  Admit date: 05/26/2023 Discharge date: 05/27/2023  Admitted From: ALF with hospice Discharge disposition: ALF with hospice  Recommendations at discharge:  Reduce the dose of carvedilol from 6.25 mg twice daily to 3.125 mg twice daily. Continue Lasix 20 mg daily only as as needed for pedal edema and shortness of breath. Continue other medicines as before.  Hospital course: Kimberly [name] is a 89 y.o. female with PMH significant for HTN, CHF, A-fib on Eliquis, CHB s/p PPM, CKD 3, bipolar disorder, fibromyalgia and GERD/esophagitis, osteoarthritis. Patient is a resident at [facility] Patient presented to the ED last night with complaint of 10/10 chest pain, generalized pain, dark urine, dysuria.  Also reported productive cough, shortness of breath. Recently observed overnight 1/12 to 1/13 for hypoxia, hypothermia and was discharged back to ALF to continue previous meds including Lasix as needed and potassium as needed. Patient remembers the 'big' potassium pills and says they were stopped sometime ago.  In the ED, patient was afebrile, heart rate in 60s, blood pressure 145/69, breathing on room air Initial labs showed WBC count normal, hemoglobin low at 7.9, sodium low at 132, potassium elevated to 6, BUN/creatinine elevated to 40/1.63,  Troponin negative x 2.  BNP elevated to 692 EKG with ventricular paced rhythm at 60 bpm, no ST-T changes, QTc 494 ms. Respiratory virus panel unremarkable Similar bilateral small pleural effusions and associated airspace opacities compared to 05/17/2023.  Patient received IV fluid, [facility] service consulted for in-hospital care.    Subjective: Patient was seen and examined this morning.  Please generatively Caucasian female.  Sitting up in bed.  Not in distress.  No new symptoms. Repeat labs this morning showed  creatinine and potassium have improved. I received a call from hospice liaison person this morning.  Apparently patient is under home hospice care and will return back to the same level of service post discharge.  Assessment/Plan: Atypical chest pain H/o GERD/esophagitis Reportedly had 10/10 chest pain prior to presentation EKG unremarkable for ischemic changes. Troponin negative x 2 Likely noncardiac in nature. Patient has history of fibromyalgia and GERD/esophagitis Continue PPI Recent Labs    05/26/23 0408 05/26/23 0514 05/26/23 0545  CKTOTAL  --   --  54  TROPONINIHS 11 15  --    Productive cough No fever, WBC count normal, Chest x-ray with similar small bilateral pleural effusion associated airspace opacities compared to 05/17/2023 procalcitonin level barely elevated Not started on antibiotics at this time. Recent Labs  Lab 05/26/23 0408 05/26/23 0545 05/27/23 0414  WBC 6.3  --  5.0  PROCALCITON  --  0.12  --    AKI on CKD 3B Baseline creatinine 1.3 from a week ago.  Presented with creatinine elevated 1.63.  Gentle IV bolus was given in the ED. With oral hydration, creatinine has improved Continue to monitor creatinine. Recent Labs    05/05/23 0803 05/06/23 0328 05/07/23 0337 05/08/23 0518 05/09/23 2595 05/11/23 0458 05/17/23 1015 05/18/23 0639 05/26/23 0408 05/27/23 0414  BUN 46* 52* 55* 65* 67* 73* 38* 36* 40* 37*  CREATININE 1.21* 1.73* 1.78* 1.44* 1.32* 1.28* 1.09* 1.30* 1.63* 1.41*   Hyperkalemia Potassium level was elevated to 6 on admission..  Confirmed on repeat BMP.  Likely due to AKI. Patient states the big potassium pills were stopped sometime ago.  Not on Aldactone Given 1 dose of Lokelma in the ED Creatinine improved  to 4.9 on labs this morning. Recent Labs  Lab 05/26/23 0408 05/26/23 0545 05/27/23 0414  K 6.0* 6.0* 4.9  MG  --  2.3  --    Chronic hyponatremia Sodium level in the past had been as low as 125.  Currently running between  130 and 135. Asymptomatic continue to monitor Recent Labs  Lab 05/26/23 0408 05/27/23 0414  NA 132* 133*   H/o CHF HTN Has mild chronic bilateral pedal edema.  Does not look volume overloaded PTA meds- carvedilol 6.25 mg twice daily, Lasix 20 mg daily Given borderline bradycardia and AKI, resume carvedilol at a lower dose of 3.125 mg twice daily.  I would continue Lasix 20 mg daily only as as needed for pedal edema and shortness of breath. Continue to monitor blood pressure and CHF symptoms  Chronic A-fib  CHB s/p PPM Continue carvedilol Continue chronic anticoagulation with Eliquis  Bipolar disorder Fibromyalgia PTA meds- Seroquel 25 mg twice daily, Zoloft 50 mg in a.m., Remeron 15 mg nightly, melatonin nightly, Continue all  Osteoarthritis Impaired mobility Uses a walker.  Lives in an ALF As needed Tylenol for pain PT eval requested  Goals of care:   Code Status: Limited: Do not attempt resuscitation (DNR) -DNR-LIMITED -Do Not Intubate/DNI    Diet:  Diet Order             Diet general           Diet Heart Room service appropriate? Yes; Fluid consistency: Thin  Diet effective now                   Nutritional status:  Body mass index is 20.37 kg/m.       Wounds:  -    Discharge Exam:   Vitals:   05/26/23 2131 05/27/23 0320 05/27/23 0648 05/27/23 0906  BP: (!) 96/38  (!) 140/67 (!) 155/70  Pulse: 62  (!) 59 61  Resp: 18  18 18   Temp: 98.1 F (36.7 C)  98.3 F (36.8 C) (!) 97.4 F (36.3 C)  TempSrc:    Oral  SpO2: 95%  95% 97%  Weight:  59 kg    Height:  5\' 7"  (1.702 m)      Body mass index is 20.37 kg/m.  General exam: Pleasant, elderly Caucasian female.  Lying down in bed.  Not in distress Skin: No rashes, lesions or ulcers. HEENT: Atraumatic, normocephalic, no obvious bleeding Lungs: Clear to auscultation bilaterally, not on oxygen CVS: S1, S2, no murmur,   GI/Abd: Soft, nontender, nondistended, bowel sound present CNS: Alert,  awake, slow to respond but oriented x 3 Psychiatry: Mood appropriate,  Extremities: No pedal edema, no calf tenderness, chronic deformities of small interphalangeal joints in both hands, R>L  Follow ups:    Follow-up Information     Care, [facility] Follow up.   Why: Resumption of services Contact information: [address] [place] [address] [phone]         Rebekah Chesterfield, NP Follow up.   Specialty: Internal Medicine Contact information: [address] [place] [address] [phone]                 Discharge Instructions:   Discharge Instructions     Call MD for:  difficulty breathing, headache or visual disturbances   Complete by: As directed    Call MD for:  extreme fatigue   Complete by: As directed    Call MD for:  hives   Complete by: As directed    Call MD for:  persistant dizziness or light-headedness   Complete by: As directed    Call MD for:  persistant nausea and vomiting   Complete by: As directed    Call MD for:  severe uncontrolled pain   Complete by: As directed    Call MD for:  temperature >100.4   Complete by: As directed    Diet general   Complete by: As directed    Discharge instructions   Complete by: As directed    Recommendations at discharge:   Reduce the dose of carvedilol from 6.25 mg twice daily to 3.125 mg twice daily.  Continue Lasix 20 mg daily only as as needed for pedal edema and shortness of breath.  Continue other medicines as before.  General discharge instructions: Follow with Primary MD Rebekah Chesterfield, NP in 7 days  Please request your PCP  to go over your hospital tests, procedures, radiology results at the follow up. Please get your medicines reviewed and adjusted.  Your PCP may decide to repeat certain labs or tests as needed. Do not drive, operate heavy machinery, perform activities at heights, swimming or participation in water activities or provide baby sitting services if  your were admitted for syncope or siezures until you have seen by Primary MD or a Neurologist and advised to do so again. [place] Controlled Substance Reporting System database was reviewed. Do not drive, operate heavy machinery, perform activities at heights, swim, participate in water activities or provide baby-sitting services while on medications for pain, sleep and mood until your outpatient physician has reevaluated you and advised to do so again.  You are strongly recommended to comply with the dose, frequency and duration of prescribed medications. Activity: As tolerated with Full fall precautions use walker/cane & assistance as needed Avoid using any recreational substances like cigarette, tobacco, alcohol, or non-prescribed drug. If you experience worsening of your admission symptoms, develop shortness of breath, life threatening emergency, suicidal or homicidal thoughts you must seek medical attention immediately by calling 911 or calling your MD immediately  if symptoms less severe. You must read complete instructions/literature along with all the possible adverse reactions/side effects for all the medicines you take and that have been prescribed to you. Take any new medicine only after you have completely understood and accepted all the possible adverse reactions/side effects.  Wear Seat belts while driving. You were cared for by a hospitalist during your hospital stay. If you have any questions about your discharge medications or the care you received while you were in the hospital after you are discharged, you can call the unit and ask to speak with the hospitalist or the covering physician. Once you are discharged, your primary care physician will handle any further medical issues. Please note that NO REFILLS for any discharge medications will be authorized once you are discharged, as it is imperative that you return to your primary care physician (or establish a relationship with a  primary care physician if you do not have one).   Increase activity slowly   Complete by: As directed        Discharge Medications:   Allergies as of 05/27/2023       Reactions   Penicillins Hives   DID THE REACTION INVOLVE: Swelling of the face/tongue/throat, SOB, or low BP? Unknown Sudden or severe rash/hives, skin peeling, or the inside of the mouth or nose? Yes Did it require medical treatment? Yes  When did it last happen?    47 or 89 years old   If all above answers are "NO", may proceed with cephalosporin use. DID THE REACTION INVOLVE: Swelling of the face/tongue/throat, SOB, or low BP? Unknown  Sudden or severe rash/hives, skin peeling, or the inside of the mouth or nose? Yes  Did it require medical treatment? Yes  When did it last happen?    56 or 89 years old    If all above answers are "NO", may proceed with cephalosporin use.   Ceftriaxone Itching   Camphor Other (See Comments)   "Allergic," per facility   Ciprofloxacin Diarrhea, Other (See Comments)   Possibly caused diarrhea November 2018   Flagyl [metronidazole] Other (See Comments)   Possibly caused diarrhea November 2018   Papaya Derivatives Hives   Iodine Rash, Other (See Comments)   If injected,  Rash/irritated skin reaction "welts"   Sulfa Antibiotics Rash        Medication List     STOP taking these medications    potassium chloride 10 MEQ tablet Commonly known as: KLOR-CON M       TAKE these medications    apixaban 2.5 MG Tabs tablet Commonly known as: ELIQUIS Take 1 tablet (2.5 mg total) by mouth 2 (two) times daily.   carvedilol 3.125 MG tablet Commonly known as: COREG Take 1 tablet (3.125 mg total) by mouth 2 (two) times daily with a meal. What changed:  medication strength how much to take   furosemide 20 MG tablet Commonly known as: LASIX Take 1 tablet (20 mg total) by mouth daily as needed for edema (for worsening leg swelling or shortness of breath). What changed:  when to  take this reasons to take this   Melatonin 3 MG Caps Take 3 mg by mouth at bedtime.   mirtazapine 15 MG tablet Commonly known as: REMERON Take 15 mg by mouth at bedtime.   omeprazole 20 MG capsule Commonly known as: PRILOSEC Take 1 capsule (20 mg total) by mouth daily. What changed: when to take this   PreserVision AREDS 2 Caps Take 1 capsule by mouth every 12 (twelve) hours.   QUEtiapine 25 MG tablet Commonly known as: SEROQUEL Take 25 mg by mouth 2 (two) times daily.   sertraline 50 MG tablet Commonly known as: ZOLOFT Take 50 mg by mouth in the morning.         The results of significant diagnostics from this hospitalization (including imaging, microbiology, ancillary and laboratory) are listed below for reference.    Procedures and Diagnostic Studies:   DG Chest 2 View Result Date: 05/26/2023 CLINICAL DATA:  Encounter for chest pain or shortness of breath EXAM: CHEST - 2 VIEW COMPARISON:  05/17/2023 FINDINGS: Stable cardiomegaly. Aortic atherosclerotic calcification. Left chest wall ICD. Small bilateral pleural effusions and associated airspace opacities. Chronic interstitial coarsening. No pneumothorax. IMPRESSION: Similar bilateral small pleural effusions and associated airspace opacities compared to 05/17/2023. Cardiomegaly and pulmonary vascular congestion. Electronically Signed   By: Minerva Fester M.D.   On: 05/26/2023 03:43     Labs:   Basic Metabolic Panel: Recent Labs  Lab 05/26/23 0408 05/26/23 0545 05/27/23 0414  NA 132*  --  133*  K 6.0* 6.0* 4.9  CL 100  --  101  CO2 19*  --  25  GLUCOSE 99  --  93  BUN 40*  --  37*  CREATININE 1.63*  --  1.41*  CALCIUM 8.8*  --  8.8*  MG  --  2.3  --    GFR Estimated Creatinine Clearance: 22.7 mL/min (A) (by C-G formula based on SCr of 1.41 mg/dL (H)). Liver Function Tests: Recent Labs  Lab 05/26/23 0545  AST 39  ALT 23  ALKPHOS 66  BILITOT 0.8  PROT 6.5  ALBUMIN 3.0*   No results for input(s):  "LIPASE", "AMYLASE" in the last 168 hours. No results for input(s): "AMMONIA" in the last 168 hours. Coagulation profile No results for input(s): "INR", "PROTIME" in the last 168 hours.  CBC: Recent Labs  Lab 05/26/23 0408 05/27/23 0414  WBC 6.3 5.0  HGB 7.9* 7.5*  HCT 25.8* 23.7*  MCV 81.6 78.5*  PLT 174 163   Cardiac Enzymes: Recent Labs  Lab 05/26/23 0545  CKTOTAL 54   BNP: Invalid input(s): "POCBNP" CBG: No results for input(s): "GLUCAP" in the last 168 hours. D-Dimer No results for input(s): "DDIMER" in the last 72 hours. Hgb A1c No results for input(s): "HGBA1C" in the last 72 hours. Lipid Profile No results for input(s): "CHOL", "HDL", "LDLCALC", "TRIG", "CHOLHDL", "LDLDIRECT" in the last 72 hours. Thyroid function studies No results for input(s): "TSH", "T4TOTAL", "T3FREE", "THYROIDAB" in the last 72 hours.  Invalid input(s): "FREET3" Anemia work up No results for input(s): "VITAMINB12", "FOLATE", "FERRITIN", "TIBC", "IRON", "RETICCTPCT" in the last 72 hours. Microbiology Recent Results (from the past 240 hours)  Culture, blood (Routine X 2) w Reflex to ID Panel     Status: None   Collection Time: 05/17/23  3:03 PM   Specimen: BLOOD  Result Value Ref Range Status   Specimen Description BLOOD BLOOD LEFT HAND  Final   Special Requests   Final    BOTTLES DRAWN AEROBIC AND ANAEROBIC Blood Culture results may not be optimal due to an inadequate volume of blood received in culture bottles   Culture   Final    NO GROWTH 5 DAYS Performed at [facility] Lab, [address], [place], [place] [address]    Report Status 05/22/2023 FINAL  Final  SARS Coronavirus 2 by RT PCR (hospital order, performed in [facility] hospital lab) *cepheid single result test* Anterior Nasal Swab     Status: None   Collection Time: 05/17/23  3:28 PM   Specimen: Anterior Nasal Swab  Result Value Ref Range Status   SARS Coronavirus 2 by RT PCR NEGATIVE NEGATIVE Final    Comment:  Performed at [facility], [address] [place], [place] [address]  Culture, blood (Routine X 2) w Reflex to ID Panel     Status: None   Collection Time: 05/17/23  8:38 PM   Specimen: BLOOD  Result Value Ref Range Status   Specimen Description BLOOD BLOOD LEFT HAND  Final   Special Requests   Final    BOTTLES DRAWN AEROBIC AND ANAEROBIC Blood Culture results may not be optimal due to an inadequate volume of blood received in culture bottles   Culture   Final    NO GROWTH 5 DAYS Performed at [facility], [address] [place], [place] [address]    Report Status 05/22/2023 FINAL  Final  Resp panel by RT-PCR (RSV, Flu A&B, Covid) Anterior Nasal Swab     Status: None   Collection Time: 05/26/23  5:45 AM   Specimen: Anterior Nasal Swab  Result Value Ref Range Status   SARS Coronavirus 2 by RT PCR NEGATIVE NEGATIVE Final   Influenza A by PCR NEGATIVE NEGATIVE Final   Influenza B by PCR  NEGATIVE NEGATIVE Final    Comment: (NOTE) The Xpert Xpress SARS-CoV-2/FLU/RSV plus assay is intended as an aid in the diagnosis of influenza from Nasopharyngeal swab specimens and should not be used as a sole basis for treatment. Nasal washings and aspirates are unacceptable for Xpert Xpress SARS-CoV-2/FLU/RSV testing.  Fact Sheet for Patients: BloggerCourse.com  Fact Sheet for Healthcare Providers: SeriousBroker.it  This test is not yet approved or cleared by the [place] FDA and has been authorized for detection and/or diagnosis of SARS-CoV-2 by FDA under an Emergency Use Authorization (EUA). This EUA will remain in effect (meaning this test can be used) for the duration of the COVID-19 declaration under Section 564(b)(1) of the Act, 21 U.S.C. section 360bbb-3(b)(1), unless the authorization is terminated or revoked.     Resp Syncytial Virus by PCR NEGATIVE NEGATIVE Final    Comment: (NOTE) Fact Sheet for  Patients: BloggerCourse.com  Fact Sheet for Healthcare Providers: SeriousBroker.it  This test is not yet approved or cleared by the [place] FDA and has been authorized for detection and/or diagnosis of SARS-CoV-2 by FDA under an Emergency Use Authorization (EUA). This EUA will remain in effect (meaning this test can be used) for the duration of the COVID-19 declaration under Section 564(b)(1) of the Act, 21 U.S.C. section 360bbb-3(b)(1), unless the authorization is terminated or revoked.  Performed at [facility], [address] [place], [place] [address]     Time coordinating discharge: 45 minutes  Signed: Leeba Barbe  [facility] 05/27/2023, 1:37 PM

## 2023-05-27 NOTE — TOC Initial Note (Signed)
Transition of Care [facility]) - Initial/Assessment Note    Patient Details  Name: Kimberly [name] MRN: [id number] Date of Birth: 11-08-1926  [facility] CM/SW Contact:    Lawerance Sabal, RN Phone Number: 05/27/2023, 11:13 AM  Clinical Narrative:                  Patient admitted from [facility], with hospice services through [facility].  Spoke w daughter to do MOON letter at patient's request and she was agreeable to plan to have patient return to ALF w continued hospice through [facility].  Per attending via secure chat he has spoken with hospice liaison Sharen Hint, with [facility]  [phone]  Patient RA.  Updated CSW to facilitate DC.   Expected Discharge Plan: Assisted Living Barriers to Discharge: Continued Medical Work up   Patient Goals and CMS Choice Patient states their goals for this hospitalization and ongoing recovery are:: return to [facility] CMS Medicare.gov Compare Post Acute Care list provided to:: Patient Choice offered to / list presented to : Patient [place] ownership interest in [facility].provided to:: Adult Children    Expected Discharge Plan and Services In-house Referral: Clinical Social Work                                            Prior Living Arrangements/Services   Lives with:: Facility Resident                   Activities of Daily Living   ADL Screening (condition at time of admission) Independently performs ADLs?: No Does the patient have a NEW difficulty with bathing/dressing/toileting/self-feeding that is expected to last >3 days?: No (nneds assist) Does the patient have a NEW difficulty with getting in/out of bed, walking, or climbing stairs that is expected to last >3 days?: No (needs assist) Does the patient have a NEW difficulty with communication that is expected to last >3 days?: No Is the patient deaf or have difficulty hearing?: Yes Does the patient have difficulty seeing, even when  wearing glasses/contacts?: Yes Does the patient have difficulty concentrating, remembering, or making decisions?: Yes  Permission Sought/Granted                  Emotional Assessment              Admission diagnosis:  Precordial pain [R07.2] Hyperkalemia [E87.5] AKI (acute kidney injury) (HCC) [N17.9] Patient Active Problem List   Diagnosis Date Noted   Hyperkalemia 05/26/2023   Hypothermia 05/17/2023   Acute encephalopathy 05/17/2023   Elevated TSH 05/12/2023   Normocytic anemia 05/12/2023   Acute heart failure with preserved ejection fraction (HFpEF) (HCC) 05/05/2023   DVT (deep venous thrombosis) (HCC) 10/24/2021   Hyponatremia 10/23/2021   UTI (urinary tract infection) 10/20/2021   Acute esophagitis    Occult GI bleeding 09/30/2021   Alternating constipation and diarrhea 07/26/2020   Gastroesophageal reflux disease 07/26/2020   Dark stools 07/26/2020   Pacemaker battery depletion    Dyspnea 06/17/2020   Finger dislocation, initial encounter 11/16/2018   Hand trauma, left, initial encounter 11/16/2018   Acute blood loss anemia 10/25/2018   Rectal bleeding 10/23/2018   Acute GI bleeding    Enteritis of small intestine due to enterotoxigenic Escherichia coli associated with diarrhea 03/24/2017   Bipolar affective disorder (HCC) 03/23/2017   Olfactory hallucinations 03/23/2017  Lower GI bleed    Glaucoma 02/28/2017   Pacemaker 03/21/2015   PUD (peptic ulcer disease) 06/14/2014   Hypokalemia 05/26/2014   Fall at home 05/26/2014   HFpEF (heart failure with preverved EF) 11/15/2013   Inability to ambulate due to ankle or foot 09/05/2013   Constipation 08/26/2013   Acute diverticulitis 08/24/2013   CKD (chronic kidney disease) stage 3, GFR 30-59 ml/min (HCC) 10/12/2012   Chronic anticoagulation 10/12/2012   Osteoarthritis    Permanent atrial fibrillation/s/p PPM    Chronic diastolic CHF (congestive heart failure) (HCC)    Fibromyalgia    Essential  hypertension    Depression    PCP:  Rebekah Chesterfield, NP Pharmacy:   [facility] - [place], [place] - [address] [place] [address] Phone: [phone] Fax: [phone]     Social Drivers of Health (SDOH) Social History: SDOH Screenings   Food Insecurity: No Food Insecurity (05/26/2023)  Housing: Low Risk  (05/26/2023)  Transportation Needs: No Transportation Needs (05/26/2023)  Utilities: Not At Risk (05/26/2023)  Alcohol Screen: Low Risk  (10/22/2018)  Depression (PHQ2-9): Medium Risk (12/13/2018)  Financial Resource Strain: Low Risk  (01/06/2023)   Received from [facility]  Physical Activity: Inactive (08/15/2019)  Social Connections: Socially Isolated (05/26/2023)  Stress: No Stress Concern Present (10/22/2018)  Tobacco Use: Medium Risk (05/26/2023)  Health Literacy: Medium Risk (01/06/2023)   Received from [facility]   SDOH Interventions:     Readmission Risk Interventions    05/09/2023    3:46 PM  Readmission Risk Prevention Plan  Transportation Screening Complete  PCP or Specialist Appt within 5-7 Days Complete  Home Care Screening Complete  Medication Review (RN CM) Complete

## 2023-05-27 NOTE — Care Management Obs Status (Signed)
MEDICARE OBSERVATION STATUS NOTIFICATION   Patient Details  Name: Kimberly [name] MRN: [id number] Date of Birth: Jul 29, 1926   Medicare Observation Status Notification Given:  Yes    Lawerance Sabal, RN 05/27/2023, 11:02 AM

## 2023-05-27 NOTE — [place] FL2 (Addendum)
[place] MEDICAID FL2 LEVEL OF CARE FORM     IDENTIFICATION  Patient Name: Kimberly [name] Birthdate: 1926/07/11 Sex: female Admission Date (Current Location): 05/26/2023  [facility] and [place] Number:  Producer, television/film/video and Address:  [facility], [address] [place], [place] [address]      Provider Number: [id number]  Attending Physician Name and Address:  Lorin Glass, MD  Relative Name and Phone Number:  karen Jones(dtr)[phone]    Current Level of Care: Hospital Recommended Level of Care: Assisted Living Facility Prior Approval Number:    Date Approved/Denied:   PASRR Number:    Discharge Plan: Other (Comment) (ALF)    Current Diagnoses: Patient Active Problem List   Diagnosis Date Noted   Hyperkalemia 05/26/2023   Hypothermia 05/17/2023   Acute encephalopathy 05/17/2023   Elevated TSH 05/12/2023   Normocytic anemia 05/12/2023   Acute heart failure with preserved ejection fraction (HFpEF) (HCC) 05/05/2023   DVT (deep venous thrombosis) (HCC) 10/24/2021   Hyponatremia 10/23/2021   UTI (urinary tract infection) 10/20/2021   Acute esophagitis    Occult GI bleeding 09/30/2021   Alternating constipation and diarrhea 07/26/2020   Gastroesophageal reflux disease 07/26/2020   Dark stools 07/26/2020   Pacemaker battery depletion    Dyspnea 06/17/2020   Finger dislocation, initial encounter 11/16/2018   Hand trauma, left, initial encounter 11/16/2018   Acute blood loss anemia 10/25/2018   Rectal bleeding 10/23/2018   Acute GI bleeding    Enteritis of small intestine due to enterotoxigenic Escherichia coli associated with diarrhea 03/24/2017   Bipolar affective disorder (HCC) 03/23/2017   Olfactory hallucinations 03/23/2017   Lower GI bleed    Glaucoma 02/28/2017   Pacemaker 03/21/2015   PUD (peptic ulcer disease) 06/14/2014   Hypokalemia 05/26/2014   Fall at home 05/26/2014   HFpEF (heart failure with preverved EF) 11/15/2013    Inability to ambulate due to ankle or foot 09/05/2013   Constipation 08/26/2013   Acute diverticulitis 08/24/2013   CKD (chronic kidney disease) stage 3, GFR 30-59 ml/min (HCC) 10/12/2012   Chronic anticoagulation 10/12/2012   Osteoarthritis    Permanent atrial fibrillation/s/p PPM    Chronic diastolic CHF (congestive heart failure) (HCC)    Fibromyalgia    Essential hypertension    Depression     Orientation RESPIRATION BLADDER Height & Weight     Self, Time, Place  Normal Incontinent, External catheter Weight: 130 lb 1.1 oz (59 kg) Height:  5\' 7"  (170.2 cm)  BEHAVIORAL SYMPTOMS/MOOD NEUROLOGICAL BOWEL NUTRITION STATUS      Continent  Diet general       AMBULATORY STATUS COMMUNICATION OF NEEDS Skin   Extensive Assist Verbally Normal                       Personal Care Assistance Level of Assistance  Bathing, Feeding, Dressing Bathing Assistance: Limited assistance Feeding assistance: Limited assistance Dressing Assistance: Limited assistance     Functional Limitations Info  Sight, Hearing, Speech Sight Info: Impaired (eyeglasses) Hearing Info: Adequate Speech Info: Impaired (Dentures-top/bottom)    SPECIAL CARE FACTORS FREQUENCY  PT (By licensed PT), OT (By licensed OT)     PT Frequency: 5x week OT Frequency: 5x week            Contractures Contractures Info: Not present    Additional Factors Info  Code Status, Allergies Code Status Info: DNR Allergies Info: Penicillins, Ceftriaxone, Camphor, Ciprofloxacin, Flagyl (Metronidazole), Papaya Derivatives,  Iodine, Sulfa Antibiotics           Discharge Medications:  TAKE these medications     apixaban 2.5 MG Tabs tablet Commonly known as: ELIQUIS Take 1 tablet (2.5 mg total) by mouth 2 (two) times daily.    carvedilol 3.125 MG tablet Commonly known as: COREG Take 1 tablet (3.125 mg total) by mouth 2 (two) times daily with a meal. What changed:  medication strength how much to take     furosemide 20 MG tablet Commonly known as: LASIX Take 1 tablet (20 mg total) by mouth daily as needed for edema (for worsening leg swelling or shortness of breath). What changed:  when to take this reasons to take this    Melatonin 3 MG Caps Take 3 mg by mouth at bedtime.    mirtazapine 15 MG tablet Commonly known as: REMERON Take 15 mg by mouth at bedtime.    omeprazole 20 MG capsule Commonly known as: PRILOSEC Take 1 capsule (20 mg total) by mouth daily. What changed: when to take this    PreserVision AREDS 2 Caps Take 1 capsule by mouth every 12 (twelve) hours.    QUEtiapine 25 MG tablet Commonly known as: SEROQUEL Take 25 mg by mouth 2 (two) times daily.    sertraline 50 MG tablet Commonly known as: ZOLOFT Take 50 mg by mouth in the morning.    Relevant Imaging Results:  Relevant Lab Results:   Additional Information SS# [id number] [place], [place]
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
## 2023-05-27 NOTE — TOC Progression Note (Signed)
Transition of Care [facility]) - Progression Note    Patient Details  Name: Kimberly [name] MRN: [id number] Date of Birth: Sep 29, 1926  Transition of Care [facility]) CM/SW Contact  Michaela Corner, [place] Phone Number: 05/27/2023, 11:25 AM  Clinical Narrative:   CSW spoke with admissions at [facility] [phone]) about pt dc back today. Per Desiree, ALF will need updated FL2, dc summary w/ med changes.   Expected Discharge Plan: Assisted Living Barriers to Discharge: Continued Medical Work up  Expected Discharge Plan and Services In-house Referral: Clinical Social Work                                             Social Determinants of Health (SDOH) Interventions SDOH Screenings   Food Insecurity: No Food Insecurity (05/26/2023)  Housing: Low Risk  (05/26/2023)  Transportation Needs: No Transportation Needs (05/26/2023)  Utilities: Not At Risk (05/26/2023)  Alcohol Screen: Low Risk  (10/22/2018)  Depression (PHQ2-9): Medium Risk (12/13/2018)  Financial Resource Strain: Low Risk  (01/06/2023)   [facility] from [facility]  Physical Activity: Inactive (08/15/2019)  Social Connections: Socially Isolated (05/26/2023)  Stress: No Stress Concern Present (10/22/2018)  Tobacco Use: Medium Risk (05/26/2023)  Health Literacy: Medium Risk (01/06/2023)   Received from [facility]    Readmission Risk Interventions    05/09/2023    3:46 PM  Readmission Risk Prevention Plan  Transportation Screening Complete  PCP or Specialist Appt within 5-7 Days Complete  Home Care Screening Complete  Medication Review (RN CM) Complete

## 2023-06-24 NOTE — Progress Notes (Signed)
 Remote pacemaker transmission.

## 2023-07-04 DEATH — deceased
